# Patient Record
Sex: Male | Born: 1935 | Race: White | Hispanic: No | Smoking: Former smoker
Health system: Southern US, Community
[De-identification: ages and names within clinical notes are randomized; demographics above are authoritative.]

## PROBLEM LIST (undated history)

## (undated) DIAGNOSIS — G4730 Sleep apnea, unspecified: Secondary | ICD-10-CM

## (undated) DIAGNOSIS — I2510 Atherosclerotic heart disease of native coronary artery without angina pectoris: Secondary | ICD-10-CM

## (undated) DIAGNOSIS — N429 Disorder of prostate, unspecified: Secondary | ICD-10-CM

## (undated) DIAGNOSIS — Z87442 Personal history of urinary calculi: Secondary | ICD-10-CM

## (undated) DIAGNOSIS — M5430 Sciatica, unspecified side: Secondary | ICD-10-CM

## (undated) DIAGNOSIS — I480 Paroxysmal atrial fibrillation: Secondary | ICD-10-CM

## (undated) DIAGNOSIS — K635 Polyp of colon: Secondary | ICD-10-CM

## (undated) DIAGNOSIS — I10 Essential (primary) hypertension: Secondary | ICD-10-CM

## (undated) DIAGNOSIS — T8859XA Other complications of anesthesia, initial encounter: Secondary | ICD-10-CM

## (undated) DIAGNOSIS — K219 Gastro-esophageal reflux disease without esophagitis: Secondary | ICD-10-CM

## (undated) DIAGNOSIS — H3530 Unspecified macular degeneration: Secondary | ICD-10-CM

## (undated) DIAGNOSIS — I714 Abdominal aortic aneurysm, without rupture: Secondary | ICD-10-CM

## (undated) DIAGNOSIS — M810 Age-related osteoporosis without current pathological fracture: Secondary | ICD-10-CM

## (undated) DIAGNOSIS — C44111 Basal cell carcinoma of skin of unspecified eyelid, including canthus: Secondary | ICD-10-CM

## (undated) DIAGNOSIS — T4145XA Adverse effect of unspecified anesthetic, initial encounter: Secondary | ICD-10-CM

## (undated) DIAGNOSIS — R413 Other amnesia: Secondary | ICD-10-CM

## (undated) DIAGNOSIS — D122 Benign neoplasm of ascending colon: Secondary | ICD-10-CM

## (undated) DIAGNOSIS — C801 Malignant (primary) neoplasm, unspecified: Secondary | ICD-10-CM

## (undated) DIAGNOSIS — M1990 Unspecified osteoarthritis, unspecified site: Secondary | ICD-10-CM

## (undated) DIAGNOSIS — H9190 Unspecified hearing loss, unspecified ear: Secondary | ICD-10-CM

## (undated) DIAGNOSIS — C61 Malignant neoplasm of prostate: Secondary | ICD-10-CM

## (undated) DIAGNOSIS — Z8719 Personal history of other diseases of the digestive system: Secondary | ICD-10-CM

## (undated) DIAGNOSIS — J449 Chronic obstructive pulmonary disease, unspecified: Secondary | ICD-10-CM

## (undated) DIAGNOSIS — F0390 Unspecified dementia without behavioral disturbance: Secondary | ICD-10-CM

## (undated) DIAGNOSIS — G939 Disorder of brain, unspecified: Secondary | ICD-10-CM

## (undated) DIAGNOSIS — Z95810 Presence of automatic (implantable) cardiac defibrillator: Secondary | ICD-10-CM

## (undated) DIAGNOSIS — I499 Cardiac arrhythmia, unspecified: Secondary | ICD-10-CM

## (undated) DIAGNOSIS — R296 Repeated falls: Secondary | ICD-10-CM

## (undated) DIAGNOSIS — D124 Benign neoplasm of descending colon: Secondary | ICD-10-CM

## (undated) DIAGNOSIS — K649 Unspecified hemorrhoids: Secondary | ICD-10-CM

## (undated) DIAGNOSIS — R32 Unspecified urinary incontinence: Secondary | ICD-10-CM

## (undated) HISTORY — DX: Benign neoplasm of descending colon: D12.4

## (undated) HISTORY — PX: NM GATED MYOVIEW (ARMC HX): HXRAD1856

## (undated) HISTORY — DX: Atherosclerotic heart disease of native coronary artery without angina pectoris: I25.10

## (undated) HISTORY — PX: NM MYOVIEW (ARMC HX): HXRAD1857

## (undated) HISTORY — DX: Polyp of colon: K63.5

## (undated) HISTORY — DX: Benign neoplasm of ascending colon: D12.2

## (undated) HISTORY — DX: Sciatica, unspecified side: M54.30

## (undated) HISTORY — DX: Paroxysmal atrial fibrillation: I48.0

## (undated) HISTORY — PX: HIATAL HERNIA REPAIR: SHX195

## (undated) HISTORY — DX: Age-related osteoporosis without current pathological fracture: M81.0

## (undated) HISTORY — PX: FRACTURE SURGERY: SHX138

## (undated) HISTORY — DX: Basal cell carcinoma of skin of unspecified eyelid, including canthus: C44.111

## (undated) HISTORY — DX: Other amnesia: R41.3

## (undated) HISTORY — DX: Chronic obstructive pulmonary disease, unspecified: J44.9

## (undated) HISTORY — DX: Disorder of brain, unspecified: G93.9

## (undated) HISTORY — PX: ANKLE SURGERY: SHX546

## (undated) HISTORY — DX: Essential (primary) hypertension: I10

## (undated) HISTORY — PX: SHOULDER ARTHROSCOPY: SHX128

## (undated) HISTORY — DX: Gastro-esophageal reflux disease without esophagitis: K21.9

## (undated) HISTORY — DX: Unspecified hemorrhoids: K64.9

---
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
## 1996-09-01 DIAGNOSIS — I2510 Atherosclerotic heart disease of native coronary artery without angina pectoris: Secondary | ICD-10-CM

## 1996-09-01 HISTORY — PX: CORONARY ARTERY BYPASS GRAFT: SHX141

## 1996-09-01 HISTORY — DX: Atherosclerotic heart disease of native coronary artery without angina pectoris: I25.10

## 1997-09-01 DIAGNOSIS — I25810 Atherosclerosis of coronary artery bypass graft(s) without angina pectoris: Secondary | ICD-10-CM | POA: Insufficient documentation

## 1998-01-30 ENCOUNTER — Ambulatory Visit (HOSPITAL_COMMUNITY): Admission: RE | Admit: 1998-01-30 | Discharge: 1998-01-30 | Payer: Self-pay | Admitting: Interventional Cardiology

## 1998-01-30 HISTORY — PX: CARDIAC CATHETERIZATION: SHX172

## 1998-07-17 ENCOUNTER — Ambulatory Visit (HOSPITAL_COMMUNITY): Admission: RE | Admit: 1998-07-17 | Discharge: 1998-07-17 | Payer: Self-pay | Admitting: Gastroenterology

## 2002-10-25 ENCOUNTER — Encounter: Payer: Self-pay | Admitting: Family Medicine

## 2002-10-25 ENCOUNTER — Encounter: Admission: RE | Admit: 2002-10-25 | Discharge: 2002-10-25 | Payer: Self-pay | Admitting: Family Medicine

## 2002-12-02 ENCOUNTER — Ambulatory Visit (HOSPITAL_COMMUNITY): Admission: RE | Admit: 2002-12-02 | Discharge: 2002-12-02 | Payer: Self-pay | Admitting: Gastroenterology

## 2002-12-02 ENCOUNTER — Encounter (INDEPENDENT_AMBULATORY_CARE_PROVIDER_SITE_OTHER): Payer: Self-pay | Admitting: Specialist

## 2003-06-02 ENCOUNTER — Encounter (INDEPENDENT_AMBULATORY_CARE_PROVIDER_SITE_OTHER): Payer: Self-pay | Admitting: Specialist

## 2003-06-02 ENCOUNTER — Ambulatory Visit (HOSPITAL_COMMUNITY): Admission: RE | Admit: 2003-06-02 | Discharge: 2003-06-02 | Payer: Self-pay | Admitting: Gastroenterology

## 2003-06-26 ENCOUNTER — Encounter: Admission: RE | Admit: 2003-06-26 | Discharge: 2003-06-26 | Payer: Self-pay | Admitting: Family Medicine

## 2003-06-26 ENCOUNTER — Encounter: Payer: Self-pay | Admitting: Family Medicine

## 2003-09-09 ENCOUNTER — Encounter: Admission: RE | Admit: 2003-09-09 | Discharge: 2003-09-09 | Payer: Self-pay | Admitting: Orthopedic Surgery

## 2003-09-24 ENCOUNTER — Other Ambulatory Visit: Payer: Self-pay

## 2003-09-25 ENCOUNTER — Other Ambulatory Visit: Payer: Self-pay

## 2004-06-06 ENCOUNTER — Encounter: Admission: RE | Admit: 2004-06-06 | Discharge: 2004-06-06 | Payer: Self-pay | Admitting: Family Medicine

## 2005-09-01 DIAGNOSIS — I771 Stricture of artery: Secondary | ICD-10-CM

## 2005-09-01 HISTORY — DX: Stricture of artery: I77.1

## 2005-11-30 DIAGNOSIS — I7409 Other arterial embolism and thrombosis of abdominal aorta: Secondary | ICD-10-CM | POA: Insufficient documentation

## 2005-12-15 ENCOUNTER — Observation Stay (HOSPITAL_COMMUNITY): Admission: RE | Admit: 2005-12-15 | Discharge: 2005-12-16 | Payer: Self-pay | Admitting: Cardiology

## 2005-12-15 DIAGNOSIS — I739 Peripheral vascular disease, unspecified: Secondary | ICD-10-CM | POA: Insufficient documentation

## 2005-12-15 HISTORY — PX: ILIAC ARTERY STENT: SHX1786

## 2005-12-21 ENCOUNTER — Encounter: Payer: Self-pay | Admitting: Vascular Surgery

## 2005-12-21 ENCOUNTER — Emergency Department (HOSPITAL_COMMUNITY): Admission: EM | Admit: 2005-12-21 | Discharge: 2005-12-21 | Payer: Self-pay | Admitting: Emergency Medicine

## 2006-06-01 DIAGNOSIS — Q254 Congenital malformation of aorta unspecified: Secondary | ICD-10-CM | POA: Insufficient documentation

## 2006-06-01 DIAGNOSIS — I6529 Occlusion and stenosis of unspecified carotid artery: Secondary | ICD-10-CM | POA: Insufficient documentation

## 2006-06-01 HISTORY — PX: OTHER SURGICAL HISTORY: SHX169

## 2006-06-02 ENCOUNTER — Ambulatory Visit (HOSPITAL_COMMUNITY): Admission: RE | Admit: 2006-06-02 | Discharge: 2006-06-02 | Payer: Self-pay | Admitting: Vascular Surgery

## 2006-06-02 HISTORY — PX: OTHER SURGICAL HISTORY: SHX169

## 2006-06-29 ENCOUNTER — Inpatient Hospital Stay (HOSPITAL_COMMUNITY): Admission: RE | Admit: 2006-06-29 | Discharge: 2006-06-30 | Payer: Self-pay | Admitting: Vascular Surgery

## 2006-06-29 ENCOUNTER — Encounter (INDEPENDENT_AMBULATORY_CARE_PROVIDER_SITE_OTHER): Payer: Self-pay | Admitting: Specialist

## 2006-06-29 HISTORY — PX: CAROTID ENDARTERECTOMY: SUR193

## 2006-10-16 ENCOUNTER — Emergency Department: Payer: Medicare Other

## 2007-02-12 ENCOUNTER — Ambulatory Visit: Payer: Self-pay | Admitting: Vascular Surgery

## 2007-07-07 ENCOUNTER — Ambulatory Visit: Payer: Self-pay | Admitting: Vascular Surgery

## 2008-02-16 ENCOUNTER — Ambulatory Visit: Payer: Self-pay | Admitting: Vascular Surgery

## 2008-08-16 ENCOUNTER — Ambulatory Visit: Payer: Self-pay | Admitting: Vascular Surgery

## 2008-10-22 ENCOUNTER — Emergency Department: Payer: Self-pay | Admitting: Emergency Medicine

## 2008-11-14 ENCOUNTER — Ambulatory Visit (HOSPITAL_BASED_OUTPATIENT_CLINIC_OR_DEPARTMENT_OTHER): Admission: RE | Admit: 2008-11-14 | Discharge: 2008-11-15 | Payer: Self-pay | Admitting: Orthopedic Surgery

## 2009-03-07 ENCOUNTER — Ambulatory Visit: Payer: Self-pay | Admitting: Vascular Surgery

## 2009-05-28 DIAGNOSIS — M1990 Unspecified osteoarthritis, unspecified site: Secondary | ICD-10-CM | POA: Insufficient documentation

## 2009-05-28 DIAGNOSIS — I10 Essential (primary) hypertension: Secondary | ICD-10-CM | POA: Insufficient documentation

## 2009-05-28 DIAGNOSIS — E7800 Pure hypercholesterolemia, unspecified: Secondary | ICD-10-CM | POA: Insufficient documentation

## 2009-05-28 DIAGNOSIS — K219 Gastro-esophageal reflux disease without esophagitis: Secondary | ICD-10-CM | POA: Insufficient documentation

## 2009-05-28 DIAGNOSIS — F4320 Adjustment disorder, unspecified: Secondary | ICD-10-CM | POA: Insufficient documentation

## 2009-06-04 DIAGNOSIS — M25579 Pain in unspecified ankle and joints of unspecified foot: Secondary | ICD-10-CM | POA: Insufficient documentation

## 2009-07-18 ENCOUNTER — Ambulatory Visit (HOSPITAL_COMMUNITY): Admission: RE | Admit: 2009-07-18 | Discharge: 2009-07-18 | Payer: Self-pay | Admitting: Orthopedic Surgery

## 2009-08-17 DIAGNOSIS — R002 Palpitations: Secondary | ICD-10-CM | POA: Insufficient documentation

## 2009-08-17 DIAGNOSIS — J309 Allergic rhinitis, unspecified: Secondary | ICD-10-CM | POA: Insufficient documentation

## 2009-09-12 ENCOUNTER — Ambulatory Visit: Payer: Self-pay | Admitting: Vascular Surgery

## 2010-09-06 ENCOUNTER — Ambulatory Visit: Admission: RE | Admit: 2010-09-06 | Discharge: 2010-09-06 | Payer: Self-pay | Source: Home / Self Care

## 2010-09-06 ENCOUNTER — Ambulatory Visit: Admit: 2010-09-06 | Payer: Self-pay | Admitting: Vascular Surgery

## 2010-10-14 ENCOUNTER — Other Ambulatory Visit: Payer: Self-pay

## 2010-10-28 LAB — HM COLONOSCOPY

## 2010-11-20 HISTORY — PX: DOPPLER ECHOCARDIOGRAPHY: SHX263

## 2010-12-04 LAB — BASIC METABOLIC PANEL
BUN: 17 mg/dL (ref 6–23)
CO2: 25 mEq/L (ref 19–32)
Calcium: 9.1 mg/dL (ref 8.4–10.5)
Chloride: 106 mEq/L (ref 96–112)
Creatinine, Ser: 1.12 mg/dL (ref 0.4–1.5)
GFR calc Af Amer: >60 mL/min (ref >60)
GFR calc non Af Amer: >60 mL/min (ref >60)
Glucose, Bld: 106 mg/dL — ABNORMAL HIGH (ref 70–99)
Potassium: 4 mEq/L (ref 3.5–5.1)
Sodium: 140 mEq/L (ref 135–145)

## 2010-12-12 LAB — POCT I-STAT 4, (NA,K, GLUC, HGB,HCT)
Glucose, Bld: 95 mg/dL (ref 70–99)
HCT: 49 % (ref 39.0–52.0)
Hemoglobin: 16.7 g/dL (ref 13.0–17.0)
Potassium: 3.9 mEq/L (ref 3.5–5.1)
Sodium: 140 mEq/L (ref 135–145)

## 2011-01-14 NOTE — Op Note (Signed)
NAME:  [name], [name]             ACCOUNT NO.:  [id number]   MEDICAL RECORD NO.:  [id number]          PATIENT TYPE:  AMB   LOCATION:  [facility]                         FACILITY:  [facility]   PHYSICIAN:  Marlowe Kays, M.D.  DATE OF BIRTH:  05-Nov-1933   DATE OF PROCEDURE:  11/14/2008  DATE OF DISCHARGE:                               OPERATIVE REPORT   PREOPERATIVE DIAGNOSIS:  Traumatic arthritis left ankle.   POSTOPERATIVE DIAGNOSIS:  Traumatic arthritis left ankle.   OPERATION:  Arthrodesis left ankle using autologous cancellus bone, two  cannulated screws and C-arm.   SURGEON:  Dr. Simonne Come.   ASSISTANT:  Mr. Idolina Primer, [place].   ANESTHESIA:  General.   PATHOLOGY AND JUSTIFICATION FOR PROCEDURE:  This man sustained a severe  ankle fracture several years ago and has a posttraumatic deformity with  valgus deformity of the talus and the ankle joint and considerable pain.  Since he has limited motion already, he has elected at this time to come  and have the above-mentioned surgery.  The operation was discussed with  him in detail.   DESCRIPTION OF PROCEDURE:  Prophylactic antibiotics, satisfactory  general anesthesia, pneumatic tourniquet with the leg Esmarch 'd out non-  sterilely and prepped from mid calf to toes with DuraPrep and draped in  a sterile field.  Timeout performed.  I made an anterior incision  following the natural interval between the extensor tendons where the  neurovascular bundle retracted, working my way down to the anterior  capsule of the ankle joint.  There was a good bit of scar removed  cautiously.  Because of this, when I was adjacent to the bone of the  distal tibia, I opened this with a knife blade and using small  periosteal elevator began dissecting along the talus and the distal  tibia working medially and laterally and placing two Homan retractors.  He had some posttraumatic deformity of the anterior and superior talus,  which I removed with a small  rongeur.  After debriding a good bit of  reactive tissue from between the medial malleolus and medial talus and  laterally adjacent to the fibula, I then used a combination of  osteotomes, curettes and small rongeur to perform squared off cutting of  the articular surfaces of both the tibia and the talus, working our way  back posteriorly, with caution there using mainly curettes to protect  the posterior soft tissues.  When I felt I had removed all fibrous and  reactive tissue as well as residual articular cartilage, I went ahead  and packed the ankle joint with the previously hydrated cancellus  allograft.  I then using the C-arm took a preliminary x-ray with the  ankle in a corrected position both in terms of varus-valgus and also  with a neutral dorsiflexion.  All joint surfaces appeared to be in good  contact with each other.  Accordingly, I then outlined on the external  ankle using a large guidepin and the C-arm position for the fixation  cannulating screws.  After confirming the position, thus I then placed  the guidepins and went through the lateral fibula  and the other through  the medial tibial above the metaphyseal flare until I felt that they  were in good position on both AP and lateral C-arm x-rays.  On measuring  the length of the guidepin, I selected a 65 mm 6.5 screw from medially  and a 75 mm 6.5 screw far laterally.  Under direct visualization with  the C-arm, I screwed both these in until they were flush with the  lateral cortices of the medial malleolar area and the lateral fibula  respectively.  This brought the screws nicely into the talus on both AP  and lateral projections with good compression of the ankle joint in the  desired position discussed above.  I then removed the guidepins and at  this point we reached 2 hours of tourniquet time.  The tourniquet was  released with no major bleeders.  I irrigated the wound gently and then  closed it with interrupted 2-0  Vicryl in the retinaculum and capsular  tissue over the distal tibia, ankle joint and over the dorsum of the  talus.  The closure was loose to allow bleeding egress.  The  subcutaneous tissue was then closed loosely once again with 3-0 Vicryl  with staples on the skin, except for the two penetration sites medially  and laterally which I closed with 4-0 nylon.  Betadine Adaptic dry  sterile dressing and a well-padded short-leg splint cast was applied.  He tolerated the procedure well and was taken to the recovery room in  satisfactory condition with no known complications.           ______________________________  Marlowe Kays, M.D.     JA/MEDQ  D:  11/14/2008  T:  11/15/2008  Job:  [id number]

## 2011-01-14 NOTE — Procedures (Signed)
CAROTID DUPLEX EXAM   INDICATION:  Follow up carotid artery disease.   HISTORY:  Diabetes:  No.  Cardiac:  CAD, CABG.  Hypertension:  Yes.  Smoking:  Quit.  Previous Surgery:  Left CEA with DPA on 06/29/2006.  CV History:  Asymptomatic.  Amaurosis Fugax No, Paresthesias No, Hemiparesis No.                                       RIGHT             LEFT  Brachial systolic pressure:         110               115  Brachial Doppler waveforms:         WNL               WNL  Vertebral direction of flow:        Antegrade         Antegrade  DUPLEX VELOCITIES (cm/sec)  CCA peak systolic                   107               88  ECA peak systolic                   119               77  ICA peak systolic                   255               88  ICA end diastolic                   89                27  PLAQUE MORPHOLOGY:                  Heterogenous      N/A  PLAQUE AMOUNT:                      Moderate-to-severe                  N/A  PLAQUE LOCATION:                    ICA               N/A   IMPRESSION:  1. Right internal carotid artery shows evidence of 60-79% stenosis.  2. Left internal carotid artery shows no evidence of stenosis, status      post carotid endarterectomy.  3. No significant changes from previous study.   ___________________________________________  Janetta Hora Fields, MD   AS/MEDQ  D:  08/16/2008  T:  08/16/2008  Job:  [id number]

## 2011-01-14 NOTE — Procedures (Signed)
CAROTID DUPLEX EXAM   INDICATION:  Followup left carotid endarterectomy.   HISTORY:  Diabetes:  No.  Cardiac:  CAD, CABG.  Hypertension:  Yes.  Smoking:  Quit.  Previous Surgery:  Left carotid endarterectomy on 06/29/2006.  CV History:  No.  Amaurosis Fugax No, Paresthesias No, Hemiparesis No                                       RIGHT             LEFT  Brachial systolic pressure:         132               134  Brachial Doppler waveforms:         Normal            Normal  Vertebral direction of flow:        Antegrade         Antegrade  DUPLEX VELOCITIES (cm/sec)  CCA peak systolic                   76                77  ECA peak systolic                   88                70  ICA peak systolic                   260               80  ICA end diastolic                   80                26  PLAQUE MORPHOLOGY:                  Heterogenous      None  PLAQUE AMOUNT:                      Moderate to severe                  None  PLAQUE LOCATION:                    ICA               None   IMPRESSION:  1. Patent left carotid endarterectomy site with no evidence of      stenosis.  2. 60-79% stenosis of the right internal carotid artery.  3. No significant change noted from the previous exam on 07/07/2007.   ___________________________________________  Janetta Hora. Fields, MD   CH/MEDQ  D:  02/16/2008  T:  02/16/2008  Job:  [id number]

## 2011-01-14 NOTE — Procedures (Signed)
CAROTID DUPLEX EXAM   INDICATION:  Carotid disease.   HISTORY:  Diabetes:  No.  Cardiac:  CABG.  Hypertension:  Yes.  Smoking:  Previous.  Previous Surgery:  Left carotid endarterectomy on 06/29/2006.  CV History:  Currently asymptomatic.  Amaurosis Fugax No, Paresthesias No, Hemiparesis No                                       RIGHT             LEFT  Brachial systolic pressure:         166               168  Brachial Doppler waveforms:         Normal            Normal  Vertebral direction of flow:        Antegrade         Antegrade  DUPLEX VELOCITIES (cm/sec)  CCA peak systolic                   98                73  ECA peak systolic                   86                57  ICA peak systolic                   181               60  ICA end diastolic                   47                12  PLAQUE MORPHOLOGY:                  Heterogeneous  PLAQUE AMOUNT:                      Moderate          None  PLAQUE LOCATION:                    ICA   IMPRESSION:  1. Doppler velocities suggest a 40% to 58% stenosis of the right      proximal internal carotid artery.  2. Patent left carotid endarterectomy site with no left internal      carotid artery stenosis.  3. Doppler velocities of the right internal carotid artery appear less      than previously recorded when compared to a previous exam on      09/12/2009 with the left internal carotid artery remaining stable.   ___________________________________________  Larina Earthly, M.D.   CH/MEDQ  D:  09/09/2010  T:  09/09/2010  Job:  [id number]

## 2011-01-14 NOTE — Procedures (Signed)
CAROTID DUPLEX EXAM   INDICATION:  Followup evaluation of known carotid artery disease.   HISTORY:  Diabetes:  No.  Cardiac:  Coronary artery bypass graft.  Hypertension:  Yes.  Smoking:  Former smoker.  Previous Surgery:  Left carotid endarterectomy with Dacron patch  angioplasty on 06/29/2006.  CV History:  Previous duplex on 08/16/2008 revealed 60-70% right ICA  stenosis and no left ICA stenosis status post endarterectomy.  Amaurosis Fugax No, Paresthesias No, Hemiparesis No                                       RIGHT             LEFT  Brachial systolic pressure:         160               160  Brachial Doppler waveforms:         Triphasic         Triphasic  Vertebral direction of flow:        Antegrade         Antegrade  DUPLEX VELOCITIES (cm/sec)  CCA peak systolic                   70                62  ECA peak systolic                   89                53  ICA peak systolic                   237               64  ICA end diastolic                   66                21  PLAQUE MORPHOLOGY:                  Mixed             None  PLAQUE AMOUNT:                      Moderate          None  PLAQUE LOCATION:                    Proximal ICA      None   IMPRESSION:  1. 60-79% right ICA stenosis.  2. No left ICA stenosis status post endarterectomy.  3. No significant change from previous study performed 08/16/2008.   ___________________________________________  Larina Earthly, M.D.   MC/MEDQ  D:  03/07/2009  T:  03/07/2009  Job:  [id number]

## 2011-01-14 NOTE — Procedures (Signed)
CAROTID DUPLEX EXAM   INDICATION:  Carotid disease.   HISTORY:  Diabetes:  No.  Cardiac:  CABG.  Hypertension:  Yes.  Smoking:  Previous.  Previous Surgery:  Left carotid endarterectomy on 06/29/2006.  CV History:  Currently asymptomatic.  Amaurosis Fugax No, Paresthesias No, Hemiparesis No                                       RIGHT             LEFT  Brachial systolic pressure:         120               122  Brachial Doppler waveforms:         Normal            Normal  Vertebral direction of flow:        Antegrade         Antegrade  DUPLEX VELOCITIES (cm/sec)  CCA peak systolic                   99                94  ECA peak systolic                   99                71  ICA peak systolic                   231               80  ICA end diastolic                   67                28  PLAQUE MORPHOLOGY:                  Heterogeneous  PLAQUE AMOUNT:                      Moderate          None  PLAQUE LOCATION:                    ICA / ECA   IMPRESSION:  1. 60%-70% stenosis of the right internal carotid artery.  2. Patent left carotid endarterectomy site with no evidence of left      internal carotid artery stenosis.  3. No significant change noted when compared to the previous exam on      03/07/2009.   ___________________________________________  Larina Earthly, M.D.   CH/MEDQ  D:  09/13/2009  T:  09/13/2009  Job:  [id number]

## 2011-01-14 NOTE — Procedures (Signed)
CAROTID DUPLEX EXAM   INDICATION:  Carotid artery disease with dizziness and feeling  unbalanced.   HISTORY:  Diabetes:  No.  Cardiac:  CAD, CABG in August, 2007.  Hypertension:  Yes.  Smoking:  Quit.  Previous Surgery:  Left CEA with DPA on 06/29/2006 by [name].  CV History:  No  Amaurosis Fugax No. Paresthesias No.  Hemiparesis No.                                       RIGHT             LEFT  Brachial systolic pressure:         134               135  Brachial Doppler waveforms:         Triphasic.        Triphasic.  Vertebral direction of flow:        Antegrade.        Antegrade.  DUPLEX VELOCITIES (cm/sec)  CCA peak systolic                   87                79  ECA peak systolic                   117               78  ICA peak systolic                   272               86  ICA end diastolic                   77                29  PLAQUE MORPHOLOGY:                  Mixed             None  PLAQUE AMOUNT:                      Moderate/severe.  None.  PLAQUE LOCATION:                    ICA               None.   IMPRESSION:  Right ICA stenosis of 60-79%.  Left ICA shows no evidence of restenosis, status post CEA.  No significant changes from previous study.   ___________________________________________  Janetta Hora Fields, MD   AS/MEDQ  D:  07/07/2007  T:  07/08/2007  Job:  [id number]

## 2011-01-14 NOTE — H&P (Signed)
HISTORY AND PHYSICAL EXAMINATION   September 06, 2010   Re:  [name]             DOB:  07-23-1934   HISTORY OF PRESENT ILLNESS:  The patient is a Caucasian male who  presents today for continued evaluation of carotid duplex status post  left carotid endarterectomy in 2007.  The patient feels well and is  without complaint.  He has been followed routinely with serial duplexes.  The patient denies all symptoms of TIA and CVA, including amaurosis  fugax, aphasia, facial droop, and hemiparesis.   PAST MEDICAL/SURGICAL HISTORY:  1. Hypertension.  2. Hyperlipidemia.  3. Carotid stenosis, status post left carotid endarterectomy.  4. Iliac disease, status post bilateral iliac stents in 2007.  5. Coronary artery disease, status post CABG 12 years ago.  6. Ankle surgery.  7. Tonsillectomy.  8. BPH.   ALLERGIES:  No known drug allergies.   FAMILY HISTORY:  Peptic ulcers, dementia, and alcohol abuse.   PERSONAL HISTORY:  The patient is widowed.  He has 1 child.  He is  retired.  He does not smoke but quit 12 years ago.  He does not drink  alcohol on a regular basis.   REVIEW OF SYSTEMS:  A complete review of systems is negative except as  delineated in the HPI.   IMAGING:  Carotid duplex completed on 08/07/2011 reveals a 40% to 59%  stenosis of the right internal carotid artery, which is decreased from  the value on 09/12/2009.  There is a patent left carotid endarterectomy  site with no left internal carotid artery stenosis.   PHYSICAL EXAMINATION:  Blood pressure 167/92, O2 saturation 95% on room  air, heart rate 60.  General:  This is a well-nourished male in no acute  distress.  HEENT: PERLA.  EOMI.  Conjunctivae normal.  There are no  carotid bruits noted.  Lungs:  Clear to auscultation.  Cardiovascular:  Regular rate and rhythm.  Abdomen:  Soft, nontender, nondistended with  normoactive bowel sounds.  Musculoskeletal:  No major deformities or  cyanosis.  There are 2+ pulses noted in the carotids, radials, femorals,  and dorsalis pedis bilaterally.  Neuro:  No focal weakness or  paresthesias.  Skin:  No ulcers or rashes.   ASSESSMENT:  Carotid artery stenosis.   PLAN:  The patient continues to do well and will need to be followed  serially with carotid duplex.  He will follow up in 1 year for repeat  duplex and call with any questions, issues, or problems in the interim.   Pecola Leisure, [place]   Fransisco Hertz, MD  Electronically Signed   AY/MEDQ  D:  09/06/2010  T:  09/06/2010  Job:  [id number]

## 2011-01-17 NOTE — Consult Note (Signed)
NAME:  [name], TALLARICO NO.:  [id number]   MEDICAL RECORD NO.:  [id number]          PATIENT TYPE:  INP   LOCATION:  NA                           FACILITY:  [facility]   PHYSICIAN:  Marin Roberts, MDDATE OF BIRTH:  Feb 21, 1934   DATE OF CONSULTATION:  DATE OF DISCHARGE:                                   CONSULTATION   Dear [name]:   Thank you for sending the intracranial images from Mr. Fenter recent  cerebral angiogram.   Right common carotid artery injection:  The right anterior cerebral artery is not seen.  This is likely aplastic.  The posterior cerebral artery is a fetal type with persistent filling from  this injection.  There is no significant stenosis, aneurysm or branch vessel  occlusion.  There is normal filling of the dural sinuses.   Left common carotid artery injection:  The anterior communicating artery is patent.  Both A1 segments fill from the  left-sided injection.  There is mild irregularity of the supraclinoid  internal carotid artery, compatible with atherosclerotic disease.  Note is  made of a downward pointing outpouching of the left A1 segment in the level  of the anterior communicating artery.  This may represent a loop, although a  focal aneurysm is not excluded.  There are no other focal stenoses or branch  vessel occlusions.  Subdural sinus is normally.   IMPRESSION:  1. A 4 to 5 mm downward outpouching of the anterior cerebral artery at the      level of the anterior communicating artery.  Most may represent a loop      of the distal left A1 segment, aneurysm is not excluded.  Dedicated      cerebral angiography is recommended for further evaluation.  2. Mild irregularity of the supraclinoid left internal carotid artery.  3. Aplastic right A1 segment.   Thanks again for sending these images.  If I can be of any further  assistance, please do not hesitate to call.           ______________________________  Marin Roberts, MD     CM/MEDQ  D:  06/26/2006  T:  06/27/2006  Job:  [id number]

## 2011-01-17 NOTE — Op Note (Signed)
NAME:  [name], [name]             ACCOUNT NO.:  [id number]   MEDICAL RECORD NO.:  [id number]          PATIENT TYPE:  AMB   LOCATION:  [facility]                          FACILITY:  [facility]   PHYSICIAN:  Janetta Hora. Fields, MD  DATE OF BIRTH:  April 21, 1934   DATE OF PROCEDURE:  06/02/2006  DATE OF DISCHARGE:  06/02/2006                                 OPERATIVE REPORT   PROCEDURE:  1. Arch aortogram.  2. Selective right and left common carotid angiogram.   PREOPERATIVE DIAGNOSIS:  Left internal carotid artery stenosis.   POSTOPERATIVE DIAGNOSIS:  Left internal carotid artery stenosis.   ANESTHESIA:  Local.   ASSISTANT:  Nurse.   INDICATIONS:  The patient recently had a duplex ultrasound which suggested a  high-grade, rapid progression of stenosis on the left internal carotid  artery.  The duplex ultrasound also suggested that the plaque extended  fairly high into the neck and may not be accessible from a cervical  approach.  The patient presents for carotid angiogram to further determine  the anatomy on the left side.   OPERATIVE DETAILS:  After obtaining informed consent, the patient was taken  to the operating room.  The patient was placed in supine position on the  angio table.  Next both groins were prepped and draped in the usual sterile  fashion.  Local anesthesia was infiltrated over the right common femoral  artery.  A Majestic needle was used to cannulate the right common femoral  artery without difficulty.  A 0.035 Wholey wire was then advanced into the  abdominal aorta under fluoroscopic guidance.  A 5-French sheath was then  placed over the guidewire in the right common femoral artery.  Next a 5-  French pigtail catheter was placed over the guidewire, and these were  advanced as a unit into the aortic arch.  The guidewire was then removed,  and arch aortogram performed using the pigtail catheter.  This shows the  innominate origin to be widely patent.  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  The origin of the right  common carotid and right subclavian are widely patent.  The right vertebral  artery has approximately 50% narrowing at its origin, but comes off normal  position of the subclavian.  Right internal mammary artery is patent at its  proximal portion.  The common carotid on the left side is patent throughout  its course.  The left subclavian artery has approximately a 25% narrowing at  its origin.  The left vertebral artery is large and dominant, and has some  mild narrowing at its origin.  The proximal portion of the left internal  mammary artery is patent.  The proximal portion of the left subclavian  artery is patent.   Next the pigtail catheter was pulled back over a guidewire and exchanged for  an H1 catheter.  The H1 catheter was then used to selectively catheterize  the innominate artery followed by the right common carotid artery.  A  selective right common carotid artery injection was then performed.  This  shows a  stenosis of the right internal carotid artery.  A-P and lateral  projections were performed.  Also A-P and lateral projections of the  intracranial vasculature were performed, and these will be interpreted by a  neuro-radiologist at a later time as far as the cervical carotid artery is  concerned.  There is an approximately 50% stenosis of the right internal  carotid artery just above the carotid bifurcation.  The external carotid  artery is widely patent.  The common carotid artery termination site is  widely patent.  Next the H1 catheter was pulled back over the guidewire.  An  attempt was made to engage the left common carotid artery briefly without  success.  Next several catheters were used to engage the left common carotid  artery due to its takeoff from the innominate artery.  In succession, first  a Simmons-1 catheter, followed by a JR-4 catheter and an IMA catheter were   used.  Several attempts were made to cannulate the artery, but these were  unsuccessful due to its takeoff and origin.  During the course of  manipulation, all these catheters were kept thoroughly flushed and in  addition before attempting to engage the left common carotid artery, the  patient was also given 3000 units of intravenous heparin.  Finally using a  Vitek catheter over the guidewire, I was able to engage the left common  carotid artery and a left common carotid artery selective injection was  obtained.  An A-P and lateral projection were obtained of the intracranial  vasculature and these will be interpreted by the neuro-radiologist at a  later time.  The cervical right carotid artery was imaged in an A-P lateral  and oblique projection.  There is a tandem stenosis at the left carotid  bifurcation with a very jagged, ulcerated-looking plaque at the carotid  bifurcation which is approximately 50% at the proximal lesion and 70% at the  more distal lesion.  The distal extent of the plaque extends up to the base  of the C2 vertebral body.  The carotid becomes more normal up at the level  of the angle of the mandible.  The level of the plaque is moderately high.  The external carotid artery is widely patent.   Next the Vitek catheter was pulled back into the aortic arch over a  guidewire.  The Vitek catheter and guidewire then both removed.  The right  femoral sheath was left in place to be removed after the ACT was at a  reasonable value.  The patient was taken to the holding area in stable  condition.  The patient was neurologically intact on transfer back to the  holding area.  The patient tolerated the procedure well and there were no  complications.   OPERATIVE FINDINGS:  1. High-grade stenosis of left internal carotid artery.  2. A 50% stenosis right internal carotid artery. 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   4. Mild vertebral artery stenosis bilaterally.      Janetta Hora. Fields, MD  Electronically Signed     CEF/MEDQ  D:  06/02/2006  T:  06/03/2006  Job:  [id number]

## 2011-01-17 NOTE — Op Note (Signed)
NAME:  [name], [name]             ACCOUNT NO.:  [id number]   MEDICAL RECORD NO.:  [id number]          PATIENT TYPE:  INP   LOCATION:  3308                         FACILITY:  [facility]   PHYSICIAN:  Janetta Hora. Fields, MD  DATE OF BIRTH:  August 25, 1934   DATE OF PROCEDURE:  06/29/2006  DATE OF DISCHARGE:  06/30/2006                                 OPERATIVE REPORT   PROCEDURE:  Left carotid endarterectomy.   PREOPERATIVE DIAGNOSIS:  Left internal carotid artery stenosis.   POSTOPERATIVE DIAGNOSIS:  Left internal carotid artery stenosis.   ANESTHESIA:  General.   SURGEON:  Charles E. Fields, M.D.   ASSISTANT:  Cari Caraway, MD, Constance Holster, P.A.-C.   INDICATIONS:  The patient is a 77 year old male with a history of  asymptomatic high grade greater than 80% left internal carotid artery  stenosis.   OPERATIVE FINDINGS:  1. 10-French shunt.  2. Dacron patch.   OPERATIVE DETAIL:  After obtaining informed consent, the patient was taken  to the operating.  The patient was placed in a supine position on the  operating table.  After induction of general anesthesia and endotracheal  intubation, the patient's entire left neck and chest were prepped and draped  in the usual sterile fashion.  A Foley catheter was placed.  Next, an  oblique incision was made along the anterior border of the  sternocleidomastoid muscle on the left side of the neck.  The incision was  carried down through the subcutaneous tissues and the platysma was incised.  The sternocleidomastoid muscle was reflected laterally as well as the left  internal jugular vein.  The common facial vein was dissected free  circumferentially and ligated and divided between silk ties.  Dissection was  carried down to the level of the common carotid artery.  This was dissected  free circumferentially and controlled with an umbilical tape.  The vagus  nerve was identified and protected from harm's way.  The ansa cervicalis was  identified and this was followed up to level of the hypoglossal nerve.  The  hypoglossal nerve was identified and protected from harm's way.  Next, the  external carotid and superior thyroid arteries were dissected free  circumferentially and controlled with vessel loops.  Dissection was then  carried up on the internal carotid artery to the level of the plaque.  Preoperative angiography had shown the plaque was quite extensive and up  high.  Therefore, dissection was carried up to the level past the plaque.  This required extensive mobilization of the hypoglossal nerve and division  of the ansa cervicalis muscle.  It also required division of the occipital  artery in order to fully mobilize the hypoglossal nerve.  The plaque  extended up to but not pass the hypoglossal nerve.   Next, the patient was given 7000 units of intravenous heparin.  The distal  internal carotid artery was controlled with a fine bulldog clamp.  Vessel  loops were pulled taut on the external and superior thyroid arteries, and  the common carotid was controlled with a peripheral DeBakey clamp.  A  longitudinal arteriotomy was made  in the common carotid artery just below  the bifurcation.  The arteriotomy was extended through the level of stenosis  which was greater than 80%.  There was also a tandem lesion up the more  distal internal carotid artery which was approximately 70%.  The arteriotomy  was extended past the level of disease.  Next, a 10-French shunt was brought  up on the operative field and this was threaded into the distal internal  carotid artery and allowed to back bleed.  This was then threaded down on  the common carotid artery and inspected for air bubbles.  After the shunt  was inspected and found to be free of the air, flow was restored to the  brain after approximately five minutes of ischemia time.  Next, an  endarterectomy was begun in a suitable plane near the carotid bifurcation.  Eversion  endarterectomy was performed in the external carotid artery.  A  feathered endpoint was obtained in the distal internal carotid artery where  there was a slight step-off so this was tacked posteriorly with several 7-0  Prolene sutures.  The plaque was removed and sent to pathology as a  specimen.  Next, all loose debris was removed from the carotid.  The artery  was thoroughly irrigated with heparinized saline solution.  A Dacron patch  was then brought up on the operative field and sewn as a patch angioplasty  using running 6-0 Prolene suture.  Just prior to completion of the  anastomosis, the shunt was clamped.  The shunt was then first removed from  the distal internal carotid artery and controlled with a vessel loop.  The  external carotid artery was back bled thoroughly.  The common carotid artery  then had the shunt removed from it and it allowed to forward bleed.  The  anastomosis was completed.  Everything was forward bled, back bled, and  thoroughly flushed once more.  Flow was first restored to the external  carotid artery, and then after approximately five cardiac cycles, to the  internal carotid artery.  There was some bleeding at the distal toe of the  patch.  This was repaired with several 7-0 Prolene sutures.  After  hemostasis had been obtained, the Doppler was used to inspected the artery.  There was good flow through the internal, external and common carotid  arteries.  Hemostasis was obtained with thrombin and Gelfoam.  The patient  was also given 50 mg of protamine.  Next, the platysma was reapproximated  using a running 3-0 Vicryl suture.  The skin was closed with 4-0 Vicryl  subcuticular stitch.  The patient tolerated the procedure well and there  were no complications.  The sponge and needle counts were correct at the end  of the case.  The patient was awakened in the operating room and found to have symmetric upper extremity and lower extremity motor strength at the  end  of the case.  The tongue was midline.  The patient was taken to the recovery  room in stable condition.      Janetta Hora. Fields, MD  Electronically Signed     CEF/MEDQ  D:  06/30/2006  T:  06/30/2006  Job:  [id number]

## 2011-01-17 NOTE — Op Note (Signed)
NAME:  [name], [name]                       ACCOUNT NO.:  [id number]   MEDICAL RECORD NO.:  [id number]                   PATIENT TYPE:  AMB   LOCATION:  ENDO                                 FACILITY:  [facility]   PHYSICIAN:  Anselmo Rod, M.D.               DATE OF BIRTH:  [birth year]   DATE OF PROCEDURE:  12/05/2002  DATE OF DISCHARGE:  12/02/2002                                 OPERATIVE REPORT   PROCEDURE:  Esophagogastroduodenoscopy.   ENDOSCOPIST:  Anselmo Rod, M.D.   INSTRUMENT USED:  Olympus video panendoscope.   INDICATION FOR PROCEDURE:  Dysphagia in a 77 year old white male.  Rule out  esophageal stricture, esophagitis, etc.   PREPROCEDURE PREPARATION:  Informed consent was procured from the patient.  The patient was fasted for eight hours prior to the procedure.   PREPROCEDURE PHYSICAL:  VITAL SIGNS:  The patient had stable vital signs.  NECK:  Supple.  CHEST:  Clear to auscultation.  S1, S2 regular.  ABDOMEN:  Soft with normal bowel sounds.   DESCRIPTION OF PROCEDURE:  The patient was placed in the left lateral  decubitus position and sedated with 60 mg of Demerol and 6 mg of Versed  intravenously.  Once the patient was adequately sedate and maintained on low-  flow oxygen and continuous cardiac monitoring, the Olympus video  panendoscope was advanced through the mouthpiece, over the tongue, into the  esophagus under direct vision.  The entire esophagus appeared normal with no  evidence of ring, stricture, masses, esophagitis, or Barrett's mucosa.  The  esophagus seems widely patent.  The GE junction was healthy as well.  The  scope was then advanced into the stomach.  A small hiatal hernia was seen on  high retroflexion.  Prominent gastric folds were appreciated on retroflexion  in the high cardia.  These were biopsied for pathology.  The rest of the  gastric mucosa, including the antrum and midbody, appeared normal.  The  duodenal bulb and the proximal  small bowel up to 60 cm appeared normal as  well.   IMPRESSION:  1. Widely patent esophagus, no evidence of strictures or esophagitis.  2. Small hiatal hernia.  3. Prominent gastric folds seen on retroflexion, biopsies done.    RECOMMENDATIONS:  1. Await pathology results.  2. Outpatient follow-up in the next seven to 10 days for further     recommendations.  3. Esophageal manometry planned if the patient's symptoms persist.                                               Anselmo Rod, M.D.    JNM/MEDQ  D:  12/05/2002  T:  12/06/2002  Job:  [id number]   cc:   Talmadge Coventry, M.D.  [address], [phone]  [place]  [place] [address]  Fax: [phone]

## 2011-01-17 NOTE — Discharge Summary (Signed)
NAME:  [name], [name]             ACCOUNT NO.:  [id number]   MEDICAL RECORD NO.:  [id number]          PATIENT TYPE:  INP   LOCATION:  3308                         FACILITY:  [facility]   PHYSICIAN:  Janetta Hora. Fields, MD  DATE OF BIRTH:  03-11-1934   DATE OF ADMISSION:  06/29/2006  DATE OF DISCHARGE:  06/30/2006                               DISCHARGE SUMMARY   ADMISSION DIAGNOSIS:  Left internal carotid artery stenosis,  asymptomatic.   DISCHARGE DIAGNOSES:  1. Left internal carotid artery stenosis, status post left carotid      endarterectomy.  2. Extracranial cerebrovascular occlusive disease.  3. Coronary artery disease.  4. Gastroesophageal reflux disease.   CONSULTS:  Dr. Marin Roberts was consulted on June 26, 2006.   PROCEDURES:  On June 29, 2006, the patient underwent a left carotid  endarterectomy with Dacron patch angioplasty by Dr. Fabienne Bruns.   HISTORY AND PHYSICAL:  This is a 77 year old Caucasian male with history  of bilateral moderate internal carotid artery stenosis and extracranial  cerebrovascular occlusive disease.  The patient has been followed by Dr.  Jacinto Halim.  In April, he had an iliac artery stent placed.  The patient has  been followed up with [name] in the office.  Recently the patient's  carotid duplex showed a significant change from February of 2007.  The  patient has almost doubled his velocity.  The patient still remains  asymptomatic.  Carotid duplex showed greater than 80% left internal  carotid artery stenosis and 68% right internal carotid artery stenosis.  Inflow velocity in the left carotid showed 100 cm a second.  The patient  was admitted for elective carotid endarterectomy on June 29, 2006.   HOSPITAL COURSE:  Postoperatively the patient has progressed as  expected.  There were no complications with his surgery.  On the evening  of June 29, 2006, the patient was alert and oriented x3.  His neural,  motor, and  sensory upper and lower extremities were intact.  The  patient's tongue was midline.  The patient's vitals were stable.   Postop day #1, the patient still remained afebrile and his blood  pressure was stable.  Blood pressure is 115/48.  The patient remained  hemodynamically stable.  His kidney function was within normal limits.  The patient's neuro was intact.  Tongue was midline and he was  swallowing okay.  The patient's incision was clear, dry, and intact, and  he had no hematoma.  The patient was discharged home after he tolerated  his breakfast, ambulated, and voided without any problems.   DISCHARGE DISPOSITION:  The patient was discharged to home without any  home health.   MEDICATIONS:  1. Toprol-XL 50 mg p.o. daily.  2. Zestoretic 10/12.5 mg p.o. daily.  3. Plavix 75 mg p.o. daily.  4. Coenzyme Q10 fifty mg p.o. daily.  5. Aspirin 325 mg p.o. daily.  6. Mevacor 1 gram p.o. q.i.d.  7. Vytorin 10/40 mg p.o. daily.  8. Nexium 40 mg p.o. daily.  9. Oxycodone 5 mg 1-2 tabs every 4 hours p.r.n.   INSTRUCTIONS:  The patient instructed  to follow a low-fat, low-salt  diet.  No driving or heavy lifting greater than 10 pounds for 2 weeks.  The patient is to ambulate 3-5 times daily and increase activity as  tolerated.  He can shower and clean his incisions with mild soap and  water.  He is to continue his breathing exercises.  The patient is to  call the office if any wound problems shall arise, such as incision  erythema, drainage, temperature greater than 101.5.   FOLLOWUP:  The patient has followup appointment with [name] on  July 17, 2006, at 2:30 p.m.      Constance Holster, PA      Janetta Hora. Fields, MD  Electronically Signed    JMW/MEDQ  D:  07/31/2006  T:  08/01/2006  Job:  [id number]   cc:   Cristy Hilts. Jacinto Halim, MD  Talmadge Coventry, M.D.

## 2011-01-17 NOTE — H&P (Signed)
NAME:  [name], [name]             ACCOUNT NO.:  [id number]   MEDICAL RECORD NO.:  [id number]          PATIENT TYPE:  INP   LOCATION:  NA                           FACILITY:  [facility]   PHYSICIAN:  Janetta Hora. Fields, MD  DATE OF BIRTH:  09/13/1933   DATE OF ADMISSION:  06/29/2006  DATE OF DISCHARGE:                                HISTORY & PHYSICAL   PRIMARY CARE PHYSICIAN:  Dr. Smith Mince.   CARDIOLOGIST:  Dr. Jacinto Halim   CHIEF COMPLAINT:  Left internal carotid artery stenosis, asymptomatic.   HISTORY OF PRESENT ILLNESS:  This is a 77 year old Caucasian male with a  history of bilateral moderate internal carotid artery stenosis, extracranial  cerebrovascular occlusive disease, coronary artery disease.  About two years  ago, the patient was sent in consultation to [name] due to moderate  carotid stenosis found on Doppler by Dr. Smith Mince.  Due to the patient  being asymptomatic at the time, it was best thought he had six-month  followup for the carotid duplex scan.  Recently the patient's carotid duplex  in August of 2007 showed a significant change from February 2007.  The  patient almost had double the velocity.  The patient currently remains  asymptomatic.  He denies any headaches, nausea and vomiting, vertigo,  dizziness, seizures, numbness, muscle weakness, dysphagia, visual changes,  syncope, pre-syncope, memory loss, confusion, TIA or CVA symptoms.   Carotid duplex exam in September showed a high-grade, greater than 80%, left  internal carotid artery stenosis and a 68% right internal carotid artery  stenosis.  End diastolic velocity in the left side was 100 centimeters per  second.  Additionally on duplex, they were unable to see the distal end  point for the plaque, suggesting that it may go fairly high into the neck.  The patient was scheduled for a carotid arteriogram on June 02, 2006.  The  results are unavailable at this time.  It was discussed with the patient  that  since his stenosis is greater than 80%, we should consider a carotid  endarterectomy for stroke prophylaxis.  The risks and benefits were  explained to the patient and he has agreed to proceed.   PAST MEDICAL HISTORY:  1. Extracranial cerebrovascular disease.  2. Coronary artery disease.  3. GERD.   PAST SURGICAL HISTORY:  1. Bilateral iliac artery stents, December 15, 2005.  2. Coronary artery bypass grafting.  3. Appendectomy.  4. T&A.   ALLERGIES:  NO KNOWN DRUG ALLERGIES.   MEDICATIONS:  1. Toprol XL 50 mg p.o. daily.  2. __________  10/12.5 mg p.o. daily.  3. Nexium 40 mg p.o. daily.  4. Vytorin 10/40 mg p.o. daily.  5. Omacor 1 gram q.i.d.  6. Aspirin 325 mg p.o. daily.  7. Co-Q-10 50 mg p.o. daily.  8. Plavix 35 mg p.o. daily, but the patient did stop this two days ago.   REVIEW OF SYSTEMS:  See HPI for pertinent positives and negatives.  Otherwise negative for diabetes mellitus type 2, cerebrovascular accident.   SOCIAL HISTORY:  The patient is married and lives with his wife.  The  patient used to smoke and he did quit seven years ago.  The patient denies  alcohol use.  He is retired and does still drive.   FAMILY HISTORY:  The patient has mother deceased at 5 with a history of  arthritis.  The patient denies any family history of cerebrovascular  accident, diabetes mellitus, myocardial infarction/coronary artery disease.   PHYSICAL EXAMINATION:  Vitals:  Blood pressure 128/70.  Heart rate of 82.  Respirations 16.  General:  This is a 77 year old Caucasian male in no acute  distress.  Head, eyes, ears, nose, throat:  Normocephalic, atraumatic.  Pupils are equal, round and reactive to light and accommodation.  Extraocular movements are intact.  Oral mucosa pink and moist.  Sclerae  anicteric.  Neck:  Supple, palpable carotids.  No carotid bruits heard on  auscultation.  Respiratory:  Symmetrical on inspiration.  Unlabored and  clear to auscultation bilaterally.   Cardiac:  Regular rate and rhythm, no  murmurs, gallops or rubs.  Abdomen:  Soft, nontender, nondistended,  normoactive bowel sounds times four.  GU/Rectal:  Deferred.  Extremities:  No edema.  Lower extremities warm with 2+ radial, femoral, popliteal,  dorsalis pedis and posterior tibialis pulses bilaterally.  Neurologic:  Nonfocal.  Alert and oriented times four.  Gait steady.  Strength 5+  bilaterally throughout.  Deep tendon reflexes 2+ and symmetrical.   ASSESSMENT:  Left internal carotid artery stenosis, asymptomatic.   PLAN:  1. We will admit the patient to [facility] on June 29, 2006,      under [name].  2. The patient will undergo a left carotid endarterectomy.  3. Risks and benefits were explained to the patient in great detail.  Dr.      Darrick Penna will see and evaluate the patient prior to admission and agrees      with the above.  The patient was instructed to go to [facility] if he shall experience any significant dizziness, syncope, TIA      symptoms.      Constance Holster, PA      Janetta Hora. Fields, MD  Electronically Signed   JMW/MEDQ  D:  06/25/2006  T:  06/26/2006  Job:  [id number]   cc:   Janetta Hora. Darrick Penna, MD  Talmadge Coventry, M.D.  Cristy Hilts. Jacinto Halim, MD

## 2011-01-17 NOTE — Discharge Summary (Signed)
NAME:  [name], [name] NO.:  [id number]   MEDICAL RECORD NO.:  [id number]          PATIENT TYPE:  OBV   LOCATION:  6533                         FACILITY:  [facility]   PHYSICIAN:  Cristy Hilts. Jacinto Halim, MD       DATE OF BIRTH:  1934/04/07   DATE OF ADMISSION:  12/15/2005  DATE OF DISCHARGE:  12/16/2005                                 DISCHARGE SUMMARY   .   DISCHARGE DIAGNOSES:  1.  Claudication and peripheral vascular disease status post bilateral      common iliac artery and right external iliac artery percutaneous      transluminal angioplasty and stenting this admission.  2.  Coronary disease, coronary artery bypass grafting in 1997.  3.  Treated hypertension.  4.  Treated hyperlipidemia  5.  Benign prostatic hypertrophy.   HOSPITAL COURSE:  The patient is a 77 year old male with history of coronary  artery bypass grafting in 1997.  He has a history of hypertension and  dyslipidemia.  He has been having bilateral hip and calf claudication for  six months.  Dopplers as an outpatient were abnormal, and he was referred  Dr. Jacinto Halim for further evaluation.  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  He is admitted for peripheral angiogram which was done December 15, 2005.  This revealed 90% right common iliac artery and 60 to 70% left  common iliac artery.  He underwent bilateral common iliac artery angioplasty  and stenting with an addition of right external iliac artery angioplasty and  stenting.  He tolerated the procedure well.  We feel he can be discharged  December 16, 2005.   DISCHARGE MEDICATIONS:  1.  Toprol XL 50 mg once a day.  2.  Zestoretic 10/12.5 once a day.  3.  Nexium 40 mg a day.  4.  Aspirin 81 mg a day.  5.  Lipitor 10 mg a day.  6.  Flomax 0.4 mg a day.  7.  Plavix 75 mg a day.   LABORATORY DATA:  White count is 10.0,  hemoglobin 12.8, hematocrit 36.8,  platelets 155.  His BMP is pending; preoperatively, his creatinine was 4.4.  Preop INR was  1.2.  Urinalysis is unremarkable.  EKG shows sinus rhythm,  incomplete right bundle branch block. He did have a Cardiolite study January  2005 that suggested mild inferior ischemia, low-risk. Catheterization done  by Dr. Katrinka Blazing in 1999 showed occlusion of the native LAD, native RCA and  moderate disease in mid circumflex with severe left main stenosis and normal  LV function.  The patient had occlusion of the SVG to the diagonal.  The SVG  to the RCA and OM was patent, and LIMA to the LAD was patent.   DISPOSITION:  The patient was discharged in stable condition and will follow-  up with Dr. Clarene Duke.  He will need follow-up outpatient Dopplers.  Will  follow-up his BMP prior to discharge and hold his Zestoretic today.      Abelino Derrick, P.A.      Cristy Hilts. Jacinto Halim, MD  Electronically Signed  LKK/MEDQ  D:  12/16/2005  T:  12/16/2005  Job:  [id number]   cc:   Thereasa Solo. Little, M.D.  Fax: [phone]

## 2011-01-17 NOTE — Op Note (Signed)
NAME:  [name], [name] NO.:  [id number]   MEDICAL RECORD NO.:  [id number]          PATIENT TYPE:  OBV   LOCATION:  2807                         FACILITY:  [facility]   PHYSICIAN:  Cristy Hilts. Jacinto Halim, MD       DATE OF BIRTH:  07-17-1934   DATE OF PROCEDURE:  12/15/2005  DATE OF DISCHARGE:                                 OPERATIVE REPORT   ATTENDING CARDIOLOGIST:  Thereasa Solo. Little, MD.   REFERRING PHYSICIAN:  Talmadge Coventry, MD.   PROCEDURE PERFORMED:  1.  Right groin access and right iliac arteriography.  2.  Crossover into the left iliac artery and left iliac arteriography.  3.  Right femoral angiography with runoff.  4.  Left femoral angiography with femoral runoff.  5.  Left groin access.  6.  PTA and direct stenting of the left common iliac artery.  7.  PTA and direct stenting of the right common iliac artery.  8.  PTA and direct stenting of the right external iliac artery.  9.  Post stent dilatation, both left and right, using the same stent      balloon.   INDICATIONS FOR PROCEDURE:  [name] is a fairly active, 77-year-  old gentleman with history of coronary artery disease status post CABG in  1997, hypertension, and hyperlipidemia, who has been having Fontaine class  II-B claudication.  He had lower extremity arterial Dopplers, which were  markedly abnormal.  Because of his significant lifestyle and continued  claudication was brought to the catheterization lab to evaluate his  peripheral anatomy.  An angioplasty was performed after confirmation of high-  grade stenosis of both the iliac arteries.   ANGIOGRAPHIC DATA:  1.  Right iliac artery and the follow-through:  The right iliac artery and      the follow-through revealed the following:  The right common iliac      artery all the way from just distal to the ostium downwards was      moderately calcified with an 80 to 90% stenosis.  The right external      iliac artery had an 80% stenosis.   There was a significant dampening      with the 5-French catheter between the aorta and the femoral artery.      The femoral artery itself continued as an SFA with a three-vessel runoff      noted in the right leg; however, the flow in the right leg was slow.      There was no significant luminal obstruction.  The SFA showed mild      luminal irregularity.  2.  Left iliac artery with femoral runoff:  The left iliac artery with      femoral runoff revealed a 60 to 70% stenosis of the left common iliac      artery.  This was also moderately calcified.  There was a large, left,      internal iliac artery, which had faint collaterals to the right.  The      common iliac artery continues as an external iliac artery, which has  mild luminal irregularity.  The left femoral artery and left superficial      femoral artery are widely patent with mild luminal irregularity.  There      is again three-vessel runoff noted in the left lower extremity.  There      is again slow flow noted in the left lower extremity.   INTERVENTION DATA:  PTA and direct stenting of the left common iliac artery  with a 9.0 x 80 mm Protege circumflex binding stent.  This stent was post  dilated with a 9.0 x 38 mm OmniLink stent balloon at 4 atmospheric pressure  for 30 seconds x2.  The stenosis was reduced from 60 to 70% to 0% with no  residual pressure gradient across the lesion.  Excellent, brisk flow was  noted in the left iliac system.   Successful PTA and stenting of the right common iliac artery with a 9.0 x 38  mm OmniLink stent deployed at 8 atmospheric pressure for 60 seconds.  This  stent was overlapped into the right external iliac artery with a 9.0 x 60 mm  Protege stent.  The self-expanding stent was post dilated with the same  OmniLink stent balloon, that is a 9.0 x 38 mm OmniLink stent balloon at 6  atmospheric pressure for 45 seconds.  Overall, the stenosis of the right  common iliac artery was reduced  from 90% to 0%, and the right external iliac  artery from 80% to 0% with no residual pressure gradient across the iliac  arterial system.   Overall, 205 ml of contrast were utilized for diagnostic angiography.  Excellent results were documented.   RECOMMENDATIONS:  Patient will be continued on aspirin and Plavix.  We will  leave Plavix from a peripheral arterial standpoint, but if there is any  contraindication, Plavix can be stopped anytime after 2 to 4 weeks of  therapy.  I expect a good, long-term resultgiven the fact that there was  fairly good expansion with the stents and they are fairly large-sized  stents.   Again, continued risk factor modification is indicated.   A total of 205 ml of contrast were utilized for diagnostic and  interventional procedure.   TECHNIQUE OF THE PROCEDURE:  In the usual steriletechnique using a 5-French  right femoral artery access, a 5-French sheath was introduced into the right  common femoral artery.  Right iliac arteriography was performed.  Then,  using a crossover catheter and from the right femoral artery, I was able to  traverse through the common iliac artery, and the left common iliac artery  was selectively engaged, and angiography of the left common iliac artery was  performed.  Then, using a crossover end-hole catheter and using a 0.035-inch  Glidewire, the crossover catheter was gently advanced into the left external  iliac artery, and a left iliac arteriogram with femoral follow-through was  performed.  The catheter was then pulled back and pressure gradients were  carefully measured.  Then, the end-hole catheter was then pulled back at the  level of the right external iliac artery, and right external iliac  arteriogram with femoral runoff was performed on the right.   TECHNIQUE OF INTERVENTION:  Using radiographic landmark, the left femoral arterial access was then obtained.  A 7-French, 45 cm long sheath was  introduced into the  left common iliac artery over a 0.035-inch Wholey wire  and the sheath was advanced into the distal abdominal aorta.  In a similar  fashion, the right  5-French sheath was exchanged to a 45 cm, 7-French  sheath.  Then, using a 0.035-inch Wholey wire on the left and J-wire on the  right, I proceeded with deployment of a self-expanding 9.0 x 80 mm Protege  stent on the left common iliac artery.  After the stent deployment, the  activation was directed to the right iliac arterial system.  A 9.0 x 38 mm  OmniLink stent was deployed into the ostium of the right common iliac artery  at 8 atmospheric pressure for 60 seconds.  This stent balloon was then  returned, and a 9.0 x 60 mm Protege self-expanding stent was utilized, and  overlapping with the previously placed stent, the external iliac artery was  stented and then post dilated with a 9.0 x 38 mm OmniLink stent balloon at 6  atmospheric pressure for 45 seconds.  Excellent results were confirmed.  Then attention was directed to the left iliac arterial system and using the  same OmniLink stent balloon, the previously deployed self-expanding stent  was post dilated at 4 atmospheric pressure for 30 seconds and balloon was  deflated and angiography  was performed.  Excellent results were noted.  Then the [facility] wire and the  J-wire were withdrawn out of the body and the sheaths were sutured in place,  and patient was transferred to the recovery area in a stable condition.  Patient tolerated the procedure.  No immediate complications were noted.      Cristy Hilts. Jacinto Halim, MD  Electronically Signed     JRG/MEDQ  D:  12/15/2005  T:  12/15/2005  Job:  [id number]   cc:   Thereasa Solo. Little, M.D.  Fax: [phone]   Talmadge Coventry, M.D.  Fax: [phone]

## 2011-01-17 NOTE — Op Note (Signed)
NAME:  RUSHI, CHASEN                       ACCOUNT NO.:  [id number]   MEDICAL RECORD NO.:  [id number]                   PATIENT TYPE:  AMB   LOCATION:  ENDO                                 FACILITY:  [facility]   PHYSICIAN:  Anselmo Rod, M.D.               DATE OF BIRTH:  01-12-1934   DATE OF PROCEDURE:  06/02/2003  DATE OF DISCHARGE:                                 OPERATIVE REPORT   PROCEDURE PERFORMED:  Colonoscopy with cold biopsies x4 and snare  polypectomy x1.   ENDOSCOPIST:  Anselmo Rod, M.D.   INSTRUMENT USED:  Olympus video colonoscope.   INDICATION FOR PROCEDURE:  A 77 year old white male with a history of  colonic polyps removed in the past, undergoing a repeat colonoscopy to rule  out recurrent polyps.   PREPROCEDURE PREPARATION:  Informed consent was procured from the patient.  The patient was fasted for eight hours prior to the procedure and prepped  with a bottle of magnesium citrate and a gallon of GoLYTELY the night prior  to the procedure.   PREPROCEDURE PHYSICAL:  VITAL SIGNS:  The patient had stable vital signs.  NECK:  Supple.  CHEST:  Clear to auscultation.  S1, S2 regular.  ABDOMEN:  Soft with normal bowel sounds.   DESCRIPTION OF PROCEDURE:  The patient was placed in the left lateral  decubitus position and sedated with 50 mg of Demerol and 6 mg of Versed  intravenously.  Once the patient was adequately sedate and maintained on low-  flow oxygen and continuous cardiac monitoring, the Olympus video colonoscope  was advanced from the rectum to the cecum and terminal ileum without  difficulty.  The patient had a few scattered diverticula throughout the  colon.  Small internal hemorrhoids were appreciated on retroflexion.  A  prominent anal papilla was also seen.  Three small sessile polyps were  biopsied from the rectosigmoid area at 15 cm.  Another 5-6 mm sessile polyp  was snared from the rectosigmoid area.  The rest of the colonic mucosa and  proximal left colon, transverse colon, right colon, cecum, and TI appeared  normal.   IMPRESSION:  1. Small internal hemorrhoids.  2. Prominent anal papilla seen on retroflexion.  3. Three small sessile polyps and a 5-6 mm polyp removed from the     rectosigmoid area (see description above).  4. Scattered diverticulosis.   RECOMMENDATIONS:  1. Await pathology results.  2.     Avoid nonsteroidals including aspirin for the next two weeks.  3. A high-fiber diet with liberal fluid intake to prevent worsening of     diverticular disease.  4. Outpatient follow-up in the next two weeks for further recommendations.  Anselmo Rod, M.D.    JNM/MEDQ  D:  06/02/2003  T:  06/02/2003  Job:  [id number]   cc:   Talmadge Coventry, M.D.  [address], [address]  [place]  [place] [address]  Fax: [phone]

## 2011-03-16 ENCOUNTER — Inpatient Hospital Stay: Payer: Self-pay | Admitting: Internal Medicine

## 2011-03-22 ENCOUNTER — Emergency Department: Payer: Self-pay | Admitting: Emergency Medicine

## 2011-05-14 ENCOUNTER — Ambulatory Visit: Payer: Self-pay | Admitting: Family Medicine

## 2011-05-14 ENCOUNTER — Encounter: Payer: Self-pay | Admitting: Internal Medicine

## 2011-05-24 ENCOUNTER — Emergency Department: Payer: Self-pay | Admitting: Emergency Medicine

## 2011-05-30 ENCOUNTER — Ambulatory Visit: Payer: Self-pay | Admitting: Family Medicine

## 2011-06-17 ENCOUNTER — Ambulatory Visit: Payer: Self-pay | Admitting: Family Medicine

## 2011-09-08 DIAGNOSIS — K120 Recurrent oral aphthae: Secondary | ICD-10-CM | POA: Diagnosis not present

## 2011-09-11 DIAGNOSIS — E291 Testicular hypofunction: Secondary | ICD-10-CM | POA: Diagnosis not present

## 2011-09-11 DIAGNOSIS — N529 Male erectile dysfunction, unspecified: Secondary | ICD-10-CM | POA: Diagnosis not present

## 2011-09-11 DIAGNOSIS — N138 Other obstructive and reflux uropathy: Secondary | ICD-10-CM | POA: Diagnosis not present

## 2011-09-11 DIAGNOSIS — N401 Enlarged prostate with lower urinary tract symptoms: Secondary | ICD-10-CM | POA: Diagnosis not present

## 2011-09-24 DIAGNOSIS — H353190 Nonexudative age-related macular degeneration, unspecified eye, stage unspecified: Secondary | ICD-10-CM | POA: Diagnosis not present

## 2011-09-30 DIAGNOSIS — J305 Allergic rhinitis due to food: Secondary | ICD-10-CM | POA: Diagnosis not present

## 2011-09-30 DIAGNOSIS — L439 Lichen planus, unspecified: Secondary | ICD-10-CM | POA: Diagnosis not present

## 2011-09-30 DIAGNOSIS — K120 Recurrent oral aphthae: Secondary | ICD-10-CM | POA: Diagnosis not present

## 2011-09-30 DIAGNOSIS — J301 Allergic rhinitis due to pollen: Secondary | ICD-10-CM | POA: Diagnosis not present

## 2011-10-01 DIAGNOSIS — L439 Lichen planus, unspecified: Secondary | ICD-10-CM | POA: Diagnosis not present

## 2011-10-01 DIAGNOSIS — B370 Candidal stomatitis: Secondary | ICD-10-CM | POA: Diagnosis not present

## 2011-10-05 DIAGNOSIS — B370 Candidal stomatitis: Secondary | ICD-10-CM | POA: Diagnosis not present

## 2011-10-14 ENCOUNTER — Ambulatory Visit: Payer: Self-pay | Admitting: Family Medicine

## 2011-10-14 DIAGNOSIS — R509 Fever, unspecified: Secondary | ICD-10-CM | POA: Diagnosis not present

## 2011-10-14 DIAGNOSIS — J029 Acute pharyngitis, unspecified: Secondary | ICD-10-CM | POA: Diagnosis not present

## 2011-10-14 DIAGNOSIS — R05 Cough: Secondary | ICD-10-CM | POA: Diagnosis not present

## 2011-10-14 DIAGNOSIS — J449 Chronic obstructive pulmonary disease, unspecified: Secondary | ICD-10-CM | POA: Diagnosis not present

## 2011-10-14 DIAGNOSIS — R059 Cough, unspecified: Secondary | ICD-10-CM | POA: Diagnosis not present

## 2011-10-29 DIAGNOSIS — D1039 Benign neoplasm of other parts of mouth: Secondary | ICD-10-CM | POA: Diagnosis not present

## 2011-10-29 DIAGNOSIS — L439 Lichen planus, unspecified: Secondary | ICD-10-CM | POA: Diagnosis not present

## 2011-10-30 DIAGNOSIS — L439 Lichen planus, unspecified: Secondary | ICD-10-CM | POA: Diagnosis not present

## 2011-11-12 DIAGNOSIS — L439 Lichen planus, unspecified: Secondary | ICD-10-CM | POA: Diagnosis not present

## 2011-12-08 DIAGNOSIS — I2510 Atherosclerotic heart disease of native coronary artery without angina pectoris: Secondary | ICD-10-CM | POA: Diagnosis not present

## 2011-12-08 DIAGNOSIS — I10 Essential (primary) hypertension: Secondary | ICD-10-CM | POA: Diagnosis not present

## 2011-12-08 DIAGNOSIS — I4891 Unspecified atrial fibrillation: Secondary | ICD-10-CM | POA: Diagnosis not present

## 2012-01-01 DIAGNOSIS — L439 Lichen planus, unspecified: Secondary | ICD-10-CM | POA: Diagnosis not present

## 2012-01-02 DIAGNOSIS — B370 Candidal stomatitis: Secondary | ICD-10-CM | POA: Diagnosis not present

## 2012-01-08 DIAGNOSIS — G2581 Restless legs syndrome: Secondary | ICD-10-CM | POA: Diagnosis not present

## 2012-01-08 DIAGNOSIS — R5381 Other malaise: Secondary | ICD-10-CM | POA: Diagnosis not present

## 2012-01-08 DIAGNOSIS — K219 Gastro-esophageal reflux disease without esophagitis: Secondary | ICD-10-CM | POA: Diagnosis not present

## 2012-01-08 DIAGNOSIS — R5383 Other fatigue: Secondary | ICD-10-CM | POA: Diagnosis not present

## 2012-01-09 DIAGNOSIS — R5381 Other malaise: Secondary | ICD-10-CM | POA: Diagnosis not present

## 2012-01-09 DIAGNOSIS — I10 Essential (primary) hypertension: Secondary | ICD-10-CM | POA: Diagnosis not present

## 2012-01-09 DIAGNOSIS — R5383 Other fatigue: Secondary | ICD-10-CM | POA: Diagnosis not present

## 2012-01-15 DIAGNOSIS — N3289 Other specified disorders of bladder: Secondary | ICD-10-CM | POA: Diagnosis not present

## 2012-02-13 DIAGNOSIS — R351 Nocturia: Secondary | ICD-10-CM | POA: Diagnosis not present

## 2012-02-13 DIAGNOSIS — N400 Enlarged prostate without lower urinary tract symptoms: Secondary | ICD-10-CM | POA: Diagnosis not present

## 2012-02-13 DIAGNOSIS — R350 Frequency of micturition: Secondary | ICD-10-CM | POA: Diagnosis not present

## 2012-02-23 DIAGNOSIS — N400 Enlarged prostate without lower urinary tract symptoms: Secondary | ICD-10-CM | POA: Diagnosis not present

## 2012-02-25 DIAGNOSIS — N400 Enlarged prostate without lower urinary tract symptoms: Secondary | ICD-10-CM | POA: Diagnosis not present

## 2012-03-03 DIAGNOSIS — N138 Other obstructive and reflux uropathy: Secondary | ICD-10-CM | POA: Diagnosis not present

## 2012-03-03 DIAGNOSIS — N323 Diverticulum of bladder: Secondary | ICD-10-CM | POA: Diagnosis not present

## 2012-03-03 DIAGNOSIS — N401 Enlarged prostate with lower urinary tract symptoms: Secondary | ICD-10-CM | POA: Diagnosis not present

## 2012-03-22 DIAGNOSIS — N400 Enlarged prostate without lower urinary tract symptoms: Secondary | ICD-10-CM | POA: Diagnosis not present

## 2012-03-23 DIAGNOSIS — H3530 Unspecified macular degeneration: Secondary | ICD-10-CM | POA: Diagnosis not present

## 2012-03-30 DIAGNOSIS — N400 Enlarged prostate without lower urinary tract symptoms: Secondary | ICD-10-CM | POA: Diagnosis not present

## 2012-04-01 DIAGNOSIS — M5430 Sciatica, unspecified side: Secondary | ICD-10-CM | POA: Diagnosis not present

## 2012-04-19 DIAGNOSIS — M25559 Pain in unspecified hip: Secondary | ICD-10-CM | POA: Diagnosis not present

## 2012-04-19 DIAGNOSIS — M47817 Spondylosis without myelopathy or radiculopathy, lumbosacral region: Secondary | ICD-10-CM | POA: Diagnosis not present

## 2012-04-19 DIAGNOSIS — M25579 Pain in unspecified ankle and joints of unspecified foot: Secondary | ICD-10-CM | POA: Diagnosis not present

## 2012-04-22 ENCOUNTER — Ambulatory Visit: Payer: Self-pay | Admitting: Orthopedic Surgery

## 2012-04-26 ENCOUNTER — Ambulatory Visit: Payer: Self-pay | Admitting: Orthopedic Surgery

## 2012-04-26 DIAGNOSIS — M5137 Other intervertebral disc degeneration, lumbosacral region: Secondary | ICD-10-CM | POA: Diagnosis not present

## 2012-04-26 DIAGNOSIS — M51379 Other intervertebral disc degeneration, lumbosacral region without mention of lumbar back pain or lower extremity pain: Secondary | ICD-10-CM | POA: Diagnosis not present

## 2012-04-26 DIAGNOSIS — M5126 Other intervertebral disc displacement, lumbar region: Secondary | ICD-10-CM | POA: Diagnosis not present

## 2012-05-04 DIAGNOSIS — R351 Nocturia: Secondary | ICD-10-CM | POA: Diagnosis not present

## 2012-05-13 DIAGNOSIS — IMO0002 Reserved for concepts with insufficient information to code with codable children: Secondary | ICD-10-CM | POA: Diagnosis not present

## 2012-05-13 DIAGNOSIS — M5126 Other intervertebral disc displacement, lumbar region: Secondary | ICD-10-CM | POA: Diagnosis not present

## 2012-05-13 DIAGNOSIS — M47817 Spondylosis without myelopathy or radiculopathy, lumbosacral region: Secondary | ICD-10-CM | POA: Diagnosis not present

## 2012-05-21 DIAGNOSIS — M5126 Other intervertebral disc displacement, lumbar region: Secondary | ICD-10-CM | POA: Diagnosis not present

## 2012-05-21 DIAGNOSIS — M47817 Spondylosis without myelopathy or radiculopathy, lumbosacral region: Secondary | ICD-10-CM | POA: Diagnosis not present

## 2012-05-21 DIAGNOSIS — IMO0002 Reserved for concepts with insufficient information to code with codable children: Secondary | ICD-10-CM | POA: Diagnosis not present

## 2012-06-09 DIAGNOSIS — M48061 Spinal stenosis, lumbar region without neurogenic claudication: Secondary | ICD-10-CM | POA: Diagnosis not present

## 2012-06-17 DIAGNOSIS — R5383 Other fatigue: Secondary | ICD-10-CM | POA: Diagnosis not present

## 2012-06-17 DIAGNOSIS — R5381 Other malaise: Secondary | ICD-10-CM | POA: Diagnosis not present

## 2012-06-17 DIAGNOSIS — K219 Gastro-esophageal reflux disease without esophagitis: Secondary | ICD-10-CM | POA: Diagnosis not present

## 2012-06-17 DIAGNOSIS — J069 Acute upper respiratory infection, unspecified: Secondary | ICD-10-CM | POA: Diagnosis not present

## 2012-06-23 DIAGNOSIS — N401 Enlarged prostate with lower urinary tract symptoms: Secondary | ICD-10-CM | POA: Diagnosis not present

## 2012-06-23 DIAGNOSIS — N138 Other obstructive and reflux uropathy: Secondary | ICD-10-CM | POA: Diagnosis not present

## 2012-06-25 DIAGNOSIS — M5126 Other intervertebral disc displacement, lumbar region: Secondary | ICD-10-CM | POA: Diagnosis not present

## 2012-06-25 DIAGNOSIS — IMO0002 Reserved for concepts with insufficient information to code with codable children: Secondary | ICD-10-CM | POA: Diagnosis not present

## 2012-06-25 DIAGNOSIS — M47817 Spondylosis without myelopathy or radiculopathy, lumbosacral region: Secondary | ICD-10-CM | POA: Diagnosis not present

## 2012-06-25 DIAGNOSIS — M48061 Spinal stenosis, lumbar region without neurogenic claudication: Secondary | ICD-10-CM | POA: Diagnosis not present

## 2012-06-29 DIAGNOSIS — I2510 Atherosclerotic heart disease of native coronary artery without angina pectoris: Secondary | ICD-10-CM | POA: Diagnosis not present

## 2012-06-29 DIAGNOSIS — E782 Mixed hyperlipidemia: Secondary | ICD-10-CM | POA: Diagnosis not present

## 2012-06-29 DIAGNOSIS — Z951 Presence of aortocoronary bypass graft: Secondary | ICD-10-CM | POA: Diagnosis not present

## 2012-07-01 DIAGNOSIS — E782 Mixed hyperlipidemia: Secondary | ICD-10-CM | POA: Diagnosis not present

## 2012-07-01 DIAGNOSIS — Z79899 Other long term (current) drug therapy: Secondary | ICD-10-CM | POA: Diagnosis not present

## 2012-07-09 DIAGNOSIS — M47817 Spondylosis without myelopathy or radiculopathy, lumbosacral region: Secondary | ICD-10-CM | POA: Diagnosis not present

## 2012-07-21 ENCOUNTER — Encounter: Payer: Self-pay | Admitting: Vascular Surgery

## 2012-08-04 DIAGNOSIS — L439 Lichen planus, unspecified: Secondary | ICD-10-CM | POA: Diagnosis not present

## 2012-08-23 ENCOUNTER — Encounter: Payer: Self-pay | Admitting: Neurosurgery

## 2012-08-27 DIAGNOSIS — N529 Male erectile dysfunction, unspecified: Secondary | ICD-10-CM | POA: Diagnosis not present

## 2012-08-27 DIAGNOSIS — E291 Testicular hypofunction: Secondary | ICD-10-CM | POA: Diagnosis not present

## 2012-09-06 ENCOUNTER — Ambulatory Visit: Payer: Self-pay | Admitting: Family Medicine

## 2012-09-06 ENCOUNTER — Other Ambulatory Visit: Payer: Self-pay | Admitting: *Deleted

## 2012-09-06 DIAGNOSIS — I739 Peripheral vascular disease, unspecified: Secondary | ICD-10-CM

## 2012-09-06 DIAGNOSIS — J449 Chronic obstructive pulmonary disease, unspecified: Secondary | ICD-10-CM | POA: Diagnosis not present

## 2012-09-06 DIAGNOSIS — Z48812 Encounter for surgical aftercare following surgery on the circulatory system: Secondary | ICD-10-CM

## 2012-09-06 DIAGNOSIS — I10 Essential (primary) hypertension: Secondary | ICD-10-CM | POA: Diagnosis not present

## 2012-09-06 DIAGNOSIS — R413 Other amnesia: Secondary | ICD-10-CM | POA: Diagnosis not present

## 2012-09-06 DIAGNOSIS — R0602 Shortness of breath: Secondary | ICD-10-CM | POA: Diagnosis not present

## 2012-09-06 DIAGNOSIS — Z0000 Encounter for general adult medical examination without abnormal findings: Secondary | ICD-10-CM | POA: Diagnosis not present

## 2012-09-07 DIAGNOSIS — R0602 Shortness of breath: Secondary | ICD-10-CM | POA: Diagnosis not present

## 2012-09-07 DIAGNOSIS — I10 Essential (primary) hypertension: Secondary | ICD-10-CM | POA: Diagnosis not present

## 2012-09-07 DIAGNOSIS — R413 Other amnesia: Secondary | ICD-10-CM | POA: Diagnosis not present

## 2012-09-08 ENCOUNTER — Encounter: Payer: Self-pay | Admitting: Neurosurgery

## 2012-09-09 ENCOUNTER — Ambulatory Visit (INDEPENDENT_AMBULATORY_CARE_PROVIDER_SITE_OTHER): Payer: Medicare Other | Admitting: Neurosurgery

## 2012-09-09 ENCOUNTER — Encounter: Payer: Self-pay | Admitting: Neurosurgery

## 2012-09-09 ENCOUNTER — Encounter (INDEPENDENT_AMBULATORY_CARE_PROVIDER_SITE_OTHER): Payer: Medicare Other | Admitting: *Deleted

## 2012-09-09 ENCOUNTER — Other Ambulatory Visit (INDEPENDENT_AMBULATORY_CARE_PROVIDER_SITE_OTHER): Payer: Medicare Other | Admitting: *Deleted

## 2012-09-09 VITALS — BP 124/80 | HR 67 | Resp 16 | Ht 72.0 in | Wt 225.0 lb

## 2012-09-09 DIAGNOSIS — I739 Peripheral vascular disease, unspecified: Secondary | ICD-10-CM | POA: Diagnosis not present

## 2012-09-09 DIAGNOSIS — I6529 Occlusion and stenosis of unspecified carotid artery: Secondary | ICD-10-CM

## 2012-09-09 DIAGNOSIS — Z48812 Encounter for surgical aftercare following surgery on the circulatory system: Secondary | ICD-10-CM

## 2012-09-09 NOTE — Progress Notes (Signed)
[name] Car[facility] Note  CC: Carotid surveillance ABIs Referring Physician: [name]  History of Present Illness: 79 year old male patient of Dr. Darrick Foster status post left CEA in October 2007. The patient denies any signs or symptoms of CVA, TIA, amaurosis fugax or any neural deficit. The patient denies claudication or rest pain and states he uses his treadmill or elliptical everyday.  Past Medical History  Diagnosis Date  . Hypertension   . Hyperlipidemia   . Occlusion and stenosis of carotid artery without mention of cerebral infarction   . CAD (coronary artery disease)   . GERD (gastroesophageal reflux disease)   . S/P T&A (status post tonsillectomy and adenoidectomy)   . Aorto-iliac disease     ROS: [x]  Positive   [ ]  Denies    General: [ ]  Weight loss, [ ]  Fever, [ ]  chills Neurologic: [ ]  Dizziness, [ ]  Blackouts, [ ]  Seizure [ ]  Stroke, [ ]  "Mini stroke", [ ]  Slurred speech, [ ]  Temporary blindness; [ ]  weakness in arms or legs, [ ]  Hoarseness Cardiac: [ ]  Chest pain/pressure, [ ]  Shortness of breath at rest [ ]  Shortness of breath with exertion, [ ]  Atrial fibrillation or irregular heartbeat Vascular: [ ]  Pain in legs with walking, [ ]  Pain in legs at rest, [ ]  Pain in legs at night,  [ ]  Non-healing ulcer, [ ]  Blood clot in vein/DVT,   Pulmonary: [ ]  Home oxygen, [ ]  Productive cough, [ ]  Coughing up blood, [ ]  Asthma,  [ ]  Wheezing Musculoskeletal:  [ ]  Arthritis, [ ]  Low back pain, [ ]  Joint pain Hematologic: [ ]  Easy Bruising, [ ]  Anemia; [ ]  Hepatitis Gastrointestinal: [ ]  Blood in stool, [ ]  Gastroesophageal Reflux/heartburn, [ ]  Trouble swallowing Urinary: [ ]  chronic Kidney disease, [ ]  on HD - [ ]  MWF or [ ]  TTHS, [ ]  Burning with urination, [ ]  Difficulty urinating Skin: [ ]  Rashes, [ ]  Wounds Psychological: [ ]  Anxiety, [ ]  Depression   Social History History  Substance Use Topics  . Smoking status: Former Smoker    Types:  Cigarettes  . Smokeless tobacco: Former Neurosurgeon    Quit date: 08/04/1997  . Alcohol Use: No    Family History Family History  Problem Relation Age of Onset  . Ulcers Mother     Peptic  . Dementia Mother   . Alcohol abuse Father     Not on File  Current Outpatient Prescriptions  Medication Sig Dispense Refill  . aspirin 81 MG chewable tablet Chew 81 mg by mouth daily.      [name] atorvastatin (LIPITOR) 40 MG tablet Take 40 mg by mouth daily.      . citalopram (CELEXA) 10 MG tablet Take 10 mg by mouth daily.      [name] esomeprazole (NEXIUM) 40 MG capsule Take 40 mg by mouth daily before breakfast.      . ezetimibe-simvastatin (VYTORIN) 10-10 MG per tablet Take 1 tablet by mouth at bedtime.      . fluticasone (FLONASE) 50 MCG/ACT nasal spray Place 2 sprays into the nose daily.      [name] lisinopril-hydrochlorothiazide (PRINZIDE,ZESTORETIC) 10-12.5 MG per tablet Take 1 tablet by mouth daily.      . metoprolol succinate (TOPROL-XL) 50 MG 24 hr tablet Take 50 mg by mouth daily. Take with or immediately following a meal.      . Multiple Vitamin (MULTIVITAMIN) tablet Take 1 tablet by mouth daily.      [name]  zolpidem (AMBIEN) 10 MG tablet Take 10 mg by mouth at bedtime as needed.        Physical Examination  Filed Vitals:   09/09/12 1011  BP: 124/80  Pulse: 67  Resp:     Body mass index is 30.52 kg/(m^2).  General:  WDWN in NAD Gait: Normal HEENT: WNL Eyes: Pupils equal Pulmonary: normal non-labored breathing , without Rales, rhonchi,  wheezing Cardiac: RRR, without  Murmurs, rubs or gallops; Abdomen: soft, NT, no masses Skin: no rashes, ulcers noted  Vascular Exam Pulses: Palpable femoral pulses bilaterally, lower extremity pulses are palpable, 3+ radial pulses bilaterally Carotid bruits: Carotid pulses to auscultation no bruits are heard Extremities without ischemic changes, no Gangrene , no cellulitis; no open wounds;  Musculoskeletal: no muscle wasting or atrophy   Neurologic: A&O X  3; Appropriate Affect ; SENSATION: normal; MOTOR FUNCTION:  moving all extremities equally. Speech is fluent/normal  Non-Invasive Vascular Imaging CAROTID DUPLEX 09/09/2012  Right ICA 60 - 79 % stenosis Left ICA 0 - 19% stenosis ABIs today are 0.98 and biphasic on the right, 1.04 and biphasic on the left  ASSESSMENT/PLAN: Asymptomatic patient will followup in 6 months with repeat carotid duplex in one year with repeat ABIs. The patient knows the signs and symptoms of CVA and knows to call 911 should this occur. The patient's questions were encouraged and answered, he is in agreement with this plan.  Lauree Chandler ANP   Clinic MD: [name]

## 2012-09-10 NOTE — Addendum Note (Signed)
Addended by: Sharee Pimple on: 09/10/2012 08:45 AM   Modules accepted: Orders

## 2012-09-16 DIAGNOSIS — B351 Tinea unguium: Secondary | ICD-10-CM | POA: Diagnosis not present

## 2012-09-16 DIAGNOSIS — M79609 Pain in unspecified limb: Secondary | ICD-10-CM | POA: Diagnosis not present

## 2012-09-27 ENCOUNTER — Other Ambulatory Visit: Payer: Self-pay | Admitting: *Deleted

## 2012-09-27 DIAGNOSIS — I6529 Occlusion and stenosis of unspecified carotid artery: Secondary | ICD-10-CM

## 2012-10-01 ENCOUNTER — Emergency Department: Payer: Self-pay | Admitting: Emergency Medicine

## 2012-10-01 DIAGNOSIS — K8020 Calculus of gallbladder without cholecystitis without obstruction: Secondary | ICD-10-CM | POA: Diagnosis not present

## 2012-10-01 DIAGNOSIS — I714 Abdominal aortic aneurysm, without rupture, unspecified: Secondary | ICD-10-CM | POA: Diagnosis not present

## 2012-10-01 DIAGNOSIS — K5289 Other specified noninfective gastroenteritis and colitis: Secondary | ICD-10-CM | POA: Diagnosis not present

## 2012-10-01 DIAGNOSIS — I10 Essential (primary) hypertension: Secondary | ICD-10-CM | POA: Diagnosis not present

## 2012-10-01 DIAGNOSIS — R6889 Other general symptoms and signs: Secondary | ICD-10-CM | POA: Diagnosis not present

## 2012-10-01 DIAGNOSIS — E7800 Pure hypercholesterolemia, unspecified: Secondary | ICD-10-CM | POA: Diagnosis not present

## 2012-10-01 DIAGNOSIS — Z951 Presence of aortocoronary bypass graft: Secondary | ICD-10-CM | POA: Diagnosis not present

## 2012-10-01 LAB — URINALYSIS, COMPLETE
Bacteria: NONE SEEN
Bilirubin,UR: NEGATIVE
Blood: NEGATIVE
Glucose,UR: NEGATIVE mg/dL (ref 0–75)
Hyaline Cast: 1
Ketone: NEGATIVE
Leukocyte Esterase: NEGATIVE
Nitrite: NEGATIVE
Ph: 5 (ref 4.5–8.0)
Protein: NEGATIVE
RBC,UR: NONE SEEN /HPF (ref 0–5)
Specific Gravity: 1.011 (ref 1.003–1.030)
Squamous Epithelial: <1
WBC UR: 1 /HPF (ref 0–5)

## 2012-10-01 LAB — LIPASE, BLOOD: Lipase: 127 U/L (ref 73–393)

## 2012-10-01 LAB — CBC WITH DIFFERENTIAL/PLATELET
Basophil #: 0.2 10*3/uL — ABNORMAL HIGH (ref 0.0–0.1)
Basophil %: 1.1 %
Eosinophil #: 0.2 10*3/uL (ref 0.0–0.7)
Eosinophil %: 1 %
HCT: 56.3 % — ABNORMAL HIGH (ref 40.0–52.0)
HGB: 18.5 g/dL — ABNORMAL HIGH (ref 13.0–18.0)
Lymphocyte #: 2.4 10*3/uL (ref 1.0–3.6)
Lymphocyte %: 15 %
MCH: 30.8 pg (ref 26.0–34.0)
MCHC: 32.8 g/dL (ref 32.0–36.0)
MCV: 94 fL (ref 80–100)
Monocyte #: 0.7 x10 3/mm (ref 0.2–1.0)
Monocyte %: 4.6 %
Neutrophil #: 12.6 10*3/uL — ABNORMAL HIGH (ref 1.4–6.5)
Neutrophil %: 78.3 %
Platelet: 171 10*3/uL (ref 150–440)
RBC: 5.99 10*6/uL — ABNORMAL HIGH (ref 4.40–5.90)
RDW: 13 % (ref 11.5–14.5)
WBC: 16 10*3/uL — ABNORMAL HIGH (ref 3.8–10.6)

## 2012-10-01 LAB — COMPREHENSIVE METABOLIC PANEL
Albumin: 4.7 g/dL (ref 3.4–5.0)
Alkaline Phosphatase: 103 U/L (ref 50–136)
Anion Gap: 9 (ref 7–16)
BUN: 18 mg/dL (ref 7–18)
Bilirubin,Total: 1.2 mg/dL — ABNORMAL HIGH (ref 0.2–1.0)
Calcium, Total: 9.8 mg/dL (ref 8.5–10.1)
Chloride: 108 mmol/L — ABNORMAL HIGH (ref 98–107)
Co2: 27 mmol/L (ref 21–32)
Creatinine: 1.55 mg/dL — ABNORMAL HIGH (ref 0.60–1.30)
EGFR (African American): 50 — ABNORMAL LOW
EGFR (Non-African Amer.): 43 — ABNORMAL LOW
Glucose: 126 mg/dL — ABNORMAL HIGH (ref 65–99)
Osmolality: 290 (ref 275–301)
Potassium: 3.9 mmol/L (ref 3.5–5.1)
SGOT(AST): 34 U/L (ref 15–37)
SGPT (ALT): 39 U/L (ref 12–78)
Sodium: 144 mmol/L (ref 136–145)
Total Protein: 8.9 g/dL — ABNORMAL HIGH (ref 6.4–8.2)

## 2012-10-18 DIAGNOSIS — Z23 Encounter for immunization: Secondary | ICD-10-CM | POA: Diagnosis not present

## 2012-10-18 DIAGNOSIS — I10 Essential (primary) hypertension: Secondary | ICD-10-CM | POA: Diagnosis not present

## 2012-10-18 DIAGNOSIS — E538 Deficiency of other specified B group vitamins: Secondary | ICD-10-CM | POA: Diagnosis not present

## 2012-11-02 DIAGNOSIS — N400 Enlarged prostate without lower urinary tract symptoms: Secondary | ICD-10-CM | POA: Diagnosis not present

## 2012-11-02 DIAGNOSIS — R350 Frequency of micturition: Secondary | ICD-10-CM | POA: Diagnosis not present

## 2012-11-02 DIAGNOSIS — E291 Testicular hypofunction: Secondary | ICD-10-CM | POA: Diagnosis not present

## 2012-11-09 DIAGNOSIS — C44319 Basal cell carcinoma of skin of other parts of face: Secondary | ICD-10-CM | POA: Diagnosis not present

## 2012-11-09 DIAGNOSIS — C44111 Basal cell carcinoma of skin of unspecified eyelid, including canthus: Secondary | ICD-10-CM | POA: Diagnosis not present

## 2012-11-09 DIAGNOSIS — L821 Other seborrheic keratosis: Secondary | ICD-10-CM | POA: Diagnosis not present

## 2012-11-09 DIAGNOSIS — L570 Actinic keratosis: Secondary | ICD-10-CM | POA: Diagnosis not present

## 2012-11-09 DIAGNOSIS — D485 Neoplasm of uncertain behavior of skin: Secondary | ICD-10-CM | POA: Diagnosis not present

## 2012-11-09 DIAGNOSIS — D0430 Carcinoma in situ of skin of unspecified part of face: Secondary | ICD-10-CM | POA: Diagnosis not present

## 2012-12-13 DIAGNOSIS — I2510 Atherosclerotic heart disease of native coronary artery without angina pectoris: Secondary | ICD-10-CM | POA: Diagnosis not present

## 2012-12-13 DIAGNOSIS — R0989 Other specified symptoms and signs involving the circulatory and respiratory systems: Secondary | ICD-10-CM | POA: Diagnosis not present

## 2012-12-13 DIAGNOSIS — E782 Mixed hyperlipidemia: Secondary | ICD-10-CM | POA: Diagnosis not present

## 2012-12-13 DIAGNOSIS — I10 Essential (primary) hypertension: Secondary | ICD-10-CM | POA: Diagnosis not present

## 2012-12-13 DIAGNOSIS — R0609 Other forms of dyspnea: Secondary | ICD-10-CM | POA: Diagnosis not present

## 2012-12-30 HISTORY — PX: OTHER SURGICAL HISTORY: SHX169

## 2013-01-03 DIAGNOSIS — C44111 Basal cell carcinoma of skin of unspecified eyelid, including canthus: Secondary | ICD-10-CM | POA: Diagnosis not present

## 2013-01-03 DIAGNOSIS — L918 Other hypertrophic disorders of the skin: Secondary | ICD-10-CM | POA: Diagnosis not present

## 2013-01-03 DIAGNOSIS — Z85828 Personal history of other malignant neoplasm of skin: Secondary | ICD-10-CM | POA: Diagnosis not present

## 2013-01-03 DIAGNOSIS — I789 Disease of capillaries, unspecified: Secondary | ICD-10-CM | POA: Diagnosis not present

## 2013-01-03 DIAGNOSIS — L908 Other atrophic disorders of skin: Secondary | ICD-10-CM | POA: Diagnosis not present

## 2013-01-03 HISTORY — DX: Basal cell carcinoma of skin of unspecified eyelid, including canthus: C44.111

## 2013-01-05 DIAGNOSIS — E782 Mixed hyperlipidemia: Secondary | ICD-10-CM | POA: Diagnosis not present

## 2013-01-05 DIAGNOSIS — Z79899 Other long term (current) drug therapy: Secondary | ICD-10-CM | POA: Diagnosis not present

## 2013-01-12 ENCOUNTER — Encounter (INDEPENDENT_AMBULATORY_CARE_PROVIDER_SITE_OTHER): Payer: Medicare Other

## 2013-01-12 DIAGNOSIS — R0602 Shortness of breath: Secondary | ICD-10-CM

## 2013-01-12 LAB — PULMONARY FUNCTION TEST

## 2013-01-13 ENCOUNTER — Encounter: Payer: Self-pay | Admitting: *Deleted

## 2013-01-15 ENCOUNTER — Encounter: Payer: Self-pay | Admitting: Cardiology

## 2013-01-17 ENCOUNTER — Encounter: Payer: Self-pay | Admitting: Cardiology

## 2013-01-17 ENCOUNTER — Telehealth: Payer: Self-pay | Admitting: Cardiology

## 2013-01-17 ENCOUNTER — Ambulatory Visit (INDEPENDENT_AMBULATORY_CARE_PROVIDER_SITE_OTHER): Payer: Medicare Other | Admitting: Cardiology

## 2013-01-17 VITALS — BP 150/80 | HR 60 | Ht 72.0 in | Wt 221.7 lb

## 2013-01-17 DIAGNOSIS — I7409 Other arterial embolism and thrombosis of abdominal aorta: Secondary | ICD-10-CM

## 2013-01-17 DIAGNOSIS — I10 Essential (primary) hypertension: Secondary | ICD-10-CM

## 2013-01-17 DIAGNOSIS — E663 Overweight: Secondary | ICD-10-CM

## 2013-01-17 DIAGNOSIS — I2510 Atherosclerotic heart disease of native coronary artery without angina pectoris: Secondary | ICD-10-CM

## 2013-01-17 DIAGNOSIS — Z951 Presence of aortocoronary bypass graft: Secondary | ICD-10-CM | POA: Insufficient documentation

## 2013-01-17 DIAGNOSIS — E785 Hyperlipidemia, unspecified: Secondary | ICD-10-CM | POA: Diagnosis not present

## 2013-01-17 DIAGNOSIS — Z6825 Body mass index (BMI) 25.0-25.9, adult: Secondary | ICD-10-CM

## 2013-01-17 MED ORDER — METOPROLOL TARTRATE 50 MG PO TABS: 25.0000 mg | ORAL_TABLET | Freq: Two times a day (BID) | ORAL | Status: DC

## 2013-01-17 MED ORDER — METOPROLOL TARTRATE 25 MG PO TABS: 25.0000 mg | ORAL_TABLET | Freq: Two times a day (BID) | ORAL | Status: DC

## 2013-01-17 MED ORDER — CHLORTHALIDONE 25 MG PO TABS: 25.0000 mg | ORAL_TABLET | Freq: Every day | ORAL | Status: DC

## 2013-01-17 NOTE — Telephone Encounter (Signed)
Pt was seen today---there is a question about the dosage of Metroprolol 50mg 

## 2013-01-17 NOTE — Progress Notes (Signed)
[facility]   Patient ID: [name], male   DOB: 11-May-1934, 79 y.o.   MRN: [id number]  Clinic Note: HPI: [name] is a 79 y.o. male with a PMH below who presents today for followup of his CPET test.  Interval History: As you recall he is a very pleasant 67 severe old gentleman who I took over as his cardiologist at his long-standing patient of Dr. Clarene Duke. My last saw him he was noting feeling a little more now than usual and more short of breath and fatigued than usual. To evaluate this region the cardiac pulmonary exercise test which she did relatively well on that is not quite able to get his heart rate up to maximal effort. He was submaximal with effort and with that had a high moderately abnormal oxygen consumption level is 75%. Notably though his heart rate only got up to a peak of 121 which is only 80% of predicted and therefore this oxygen consumption would probably be an underestimation of his true function. Despite this he denied having any anginal symptoms during the exercise portion. The assessment was that he had ischemic dysfunction with decreased stroke volume and increasing work rate with some mild EKG changes noted during the last minute of exercise.  Am not exactly sure how to interpret the results based on the fact that he is certainly not high risk with oxygen consumption a 75% with submaximal effort. He denied any anginal symptoms. At this point I think he he still is relatively low to moderate risk of significant ischemic coronary disease.  Since his visit he still has some general feeling of fatigue and less exercise tolerance but he has been going to the gym more frequently. He's been trying to his weight. He has lost about 3 pounds since last visit. Otherwise he seems relatively stable cardiac standpoint and an generally healthy.  Review of his Cardiovascular ROS is as follows: positive for - Exercise related fatigue and mild  shortness of breath with significant exertion. negative for - chest pain, edema, irregular heartbeat, loss of consciousness, murmur, orthopnea, palpitations, paroxysmal nocturnal dyspnea, rapid heart rate or shortness of breath Additional cardiac review of systems: Lightheadedness - no, dizzinesscno, syncope/near-syncope - no; TIA/amaurosis fugax - no Melena - no, hematochezia no; hematuria - no; nosebleeds - no; claudication - no  Allergies  Allergen Reactions  . Codeine   . Niacin And Related     Current Outpatient Prescriptions  Medication Sig Dispense Refill  . Ashwagandha Extract 2.5 % POWD by Does not apply route.      [name] aspirin 81 MG chewable tablet Chew 81 mg by mouth daily.      . cholecalciferol (VITAMIN D) 400 UNITS TABS Take 400 Units by mouth.      . dexlansoprazole (DEXILANT) 60 MG capsule Take 60 mg by mouth daily.      . eszopiclone (LUNESTA) 2 MG TABS Take 2 mg by mouth at bedtime. Take immediately before bedtime      . ezetimibe (ZETIA) 10 MG tablet Take 10 mg by mouth daily.      . finasteride (PROSCAR) 5 MG tablet Take 5 mg by mouth daily.      . fish oil-omega-3 fatty acids 1000 MG capsule Take 2 g by mouth daily.      . Multiple Vitamin (MULTIVITAMIN) tablet Take 1 tablet by mouth daily.      . rosuvastatin (CRESTOR) 20 MG tablet Take 20 mg by mouth  daily.      . tadalafil (CIALIS) 5 MG tablet Take 5 mg by mouth daily as needed.      . Testosterone (AXIRON) 30 MG/ACT SOLN Place onto the skin.      . fluticasone (FLONASE) 50 MCG/ACT nasal spray Place 2 sprays into the nose daily.      [name] lisinopril (PRINIVIL,ZESTRIL) 20 MG tablet       . metoprolol tartrate (LOPRESSOR) 50 MG tablet Take 1 tablet (25 mg total) by mouth in the a.m. and 1/2 tab in the p.m.[name]  180 tablet  3   No current facility-administered medications for this visit.    Past Medical History  Diagnosis Date  . Hypertension   . Hyperlipidemia   . Occlusion and stenosis of carotid artery without  mention of cerebral infarction   . CAD (coronary artery disease)   . GERD (gastroesophageal reflux disease)   . S/P T&A (status post tonsillectomy and adenoidectomy)   . Aorto-iliac disease   . Erectile dysfunction   . Sciatic pain     chronic  . OSA on CPAP     Past Surgical History  Procedure Laterality Date  . Stents  April 16,2007    Bilateral  iliac a stents  . Appendectomy    . Carotid endarterectomy  Oct. 29, 2007    Left  cea  . Coronary artery bypass graft  1998  . Hiatal hernia repair    . Ankle surgery    . Doppler echocardiography  11/20/2010    EF =>55%; LV norm mild aortic scelorosis  . Nm myocar perf wall motion  11/11/2010    STRESS----NORMAL PERFUSION ,EF 68%  . Cardiac catheterization  01/30/1998     total native LAD andRCA ,mod native CIRC; svg to RCA and OBTUSE MARG. PATENT the left internal mammary artery graft to LAD ; LV NORMAL  /  . Carotid doppler  03/16/2011  . Arch aortogram and carotid aortogram  06/02/2006    Dr Darrick Penna did surgery  . Iliac arteriography  12/15/2005    PTA and direct stenting rgt and lft common iliac arteries  . Cpet/met  12/2012    History   Social History  . Marital Status: Widowed    Spouse Name: N/A    Number of Children: N/A  . Years of Education: N/A   Occupational History  . Not on file.   Social History Main Topics  . Smoking status: Former Smoker    Types: Cigarettes  . Smokeless tobacco: Former Neurosurgeon    Quit date: 08/04/1997  . Alcohol Use: No  . Drug Use: No  . Sexually Active: Not on file   Other Topics Concern  . Not on file   Social History Narrative  . No narrative on file    ROS: A comprehensive Review of Systems - Negative except Pertinent positives noted above.  PHYSICAL EXAM BP 150/80  Pulse 60  Ht 6' (1.829 m)  Wt 221 lb 11.2 oz (100.562 kg)  BMI 30.06 kg/m2 General appearance: alert, cooperative, appears stated age, no distress and mildly obese Neck: no adenopathy, no carotid bruit  and no JVD; head is normocephalic/atraumatic; extraocular muscles intact, anicteric sclera. MMM. Lungs: clear to auscultation bilaterally, normal percussion bilaterally and non-labored Heart: regular rate and rhythm, S1, split S2 normal, click, rub or gallop, nondisplaced PMI; 1/6 SEM heard at RUSB, crescendo-decrescendo, early peaking Abdomen: soft, non-tender; bowel sounds normal; no masses,  no organomegaly; no HJR; mild truncal obesity. Extremities: extremities  normal, atraumatic, no cyanosis, and edema; no signs of venous stasis Pulses: 2+ and symmetric; Skin: normal, mobility and turgor normal, no evidence of bleeding or bruising, no lesions noted and temperature normal Neurologic: Mental status: Alert, oriented, thought content appropriate Cranial nerves: normal (II-XII grossly intact)  [id number] today: No  ASSESSMENT: Relatively stable overall, low to slightly moderate risk CPET test. Not overly helpful. Would preferentially use Myoview/Cardiolite for followup testing.  1. CAD (coronary artery disease) 2. Hypertension - Basic Metabolic Panel (BMET) 3. Hyperlipidemia 4. Aorto-iliac disease 5. Overweight (BMI 25.0-29.9)  PLAN: Orders Placed This Encounter  Procedures  . Basic Metabolic Panel (BMET)    Order Specific Question:  Has the patient fasted?    Answer:  No   Meds ordered this encounter  Medications  . lisinopril (PRINIVIL,ZESTRIL) 20 MG tablet    Sig:   . DISCONTD: metoprolol (LOPRESSOR) 50 MG tablet    Sig: Taking 1 tablet in the morning and 1/2 tablet in the evening  . DISCONTD: metoprolol (LOPRESSOR) 50 MG tablet; start 25 mg twice a day     Dispense:  60 tablet    Refill:  12  . chlorthalidone (HYGROTON) 25 MG tablet    Sig: Take 1 tablet (25 mg total) by mouth daily.    Dispense:  30 tablet    Refill:  12   Followup 3 months  Daryus Sowash W, M.D., M.S. [facility] [address] [place], [place]   [address]  [phone] Pager # [phone] 01/18/2013 12:52 AM

## 2013-01-17 NOTE — Telephone Encounter (Signed)
Corrected Rx sent to [facility] pharmacy. LM with new RX Metoprolol Tart 25mg  BID

## 2013-01-17 NOTE — Patient Instructions (Addendum)
Your physician has recommended you make the following change in your medication Metoprol tart. 25 mg  One tablet twice a day.  Start Chlororthalidone  25mg  one tablet  Daily.  Your physician recommends that you return for lab work in one week. Please go to [facility].  You will have BMP done.  ....Marland KitchenMarland KitchenYour physician recommends that you schedule a follow-up appointment in 3 months

## 2013-01-18 NOTE — Assessment & Plan Note (Signed)
With the reduced dose of beta blocker will add chlorthalidone 25 mg. Followup labs in roughly one week to monitor electrolytes and renal function.

## 2013-01-18 NOTE — Assessment & Plan Note (Signed)
Overall, it is readily minimal loss of 3 pounds from last visit. He still has some weight to go. I think this may have somebody with his decreased exercise tolerance simply due to his weight being up. I have reinforced importance of continued exercise and dietary modification.

## 2013-01-18 NOTE — Assessment & Plan Note (Addendum)
Relatively stable. No active anginal symptoms. 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 At this point I think continued current medical therapy is warranted. As his heart rate did have some difficulty increased and decreased level of will decrease his beta blocker to 25 mg twice a day from 50 mg in the morning and 25 mg at night. Continue ACE inhibitor, aspirin and statin along with omega-3 fatty acids and Zetia -- will need to review lipids on followup visit.

## 2013-01-18 NOTE — Assessment & Plan Note (Signed)
No active claudication symptoms. He seemed to tolerate the bicycle without to much trouble. We'll need to review when he is due for repeat Dopplers.

## 2013-01-22 NOTE — Progress Notes (Signed)
We need to remind him to get his labs checked.  Marykay Lex, MD

## 2013-01-25 DIAGNOSIS — K589 Irritable bowel syndrome without diarrhea: Secondary | ICD-10-CM | POA: Diagnosis not present

## 2013-01-25 DIAGNOSIS — Z23 Encounter for immunization: Secondary | ICD-10-CM | POA: Diagnosis not present

## 2013-01-25 DIAGNOSIS — K219 Gastro-esophageal reflux disease without esophagitis: Secondary | ICD-10-CM | POA: Diagnosis not present

## 2013-01-25 DIAGNOSIS — K5900 Constipation, unspecified: Secondary | ICD-10-CM | POA: Diagnosis not present

## 2013-01-27 DIAGNOSIS — I10 Essential (primary) hypertension: Secondary | ICD-10-CM | POA: Diagnosis not present

## 2013-01-28 LAB — BASIC METABOLIC PANEL
BUN/Creatinine Ratio: 19 (ref 10–22)
BUN: 24 mg/dL (ref 8–27)
CO2: 25 mmol/L (ref 19–28)
Calcium: 9.8 mg/dL (ref 8.6–10.2)
Chloride: 100 mmol/L (ref 97–108)
Creatinine, Ser: 1.25 mg/dL (ref 0.76–1.27)
GFR calc Af Amer: 64 mL/min/{1.73_m2} (ref >59)
GFR calc non Af Amer: 55 mL/min/{1.73_m2} — ABNORMAL LOW (ref >59)
Glucose: 90 mg/dL (ref 65–99)
Potassium: 4.2 mmol/L (ref 3.5–5.2)
Sodium: 139 mmol/L (ref 134–144)

## 2013-02-01 ENCOUNTER — Other Ambulatory Visit: Payer: Self-pay | Admitting: *Deleted

## 2013-02-01 MED ORDER — EZETIMIBE 10 MG PO TABS: 10.0000 mg | ORAL_TABLET | Freq: Every day | ORAL | Status: DC

## 2013-02-01 NOTE — Telephone Encounter (Signed)
Pt request refill of Zetia.  Refill sent to pharmacy via escribed

## 2013-02-16 DIAGNOSIS — C44319 Basal cell carcinoma of skin of other parts of face: Secondary | ICD-10-CM | POA: Diagnosis not present

## 2013-02-25 DIAGNOSIS — N400 Enlarged prostate without lower urinary tract symptoms: Secondary | ICD-10-CM | POA: Diagnosis not present

## 2013-02-25 DIAGNOSIS — E291 Testicular hypofunction: Secondary | ICD-10-CM | POA: Diagnosis not present

## 2013-02-25 DIAGNOSIS — L259 Unspecified contact dermatitis, unspecified cause: Secondary | ICD-10-CM | POA: Diagnosis not present

## 2013-02-25 DIAGNOSIS — R350 Frequency of micturition: Secondary | ICD-10-CM | POA: Diagnosis not present

## 2013-02-25 DIAGNOSIS — N529 Male erectile dysfunction, unspecified: Secondary | ICD-10-CM | POA: Diagnosis not present

## 2013-03-10 ENCOUNTER — Other Ambulatory Visit: Payer: Medicare Other

## 2013-03-10 ENCOUNTER — Ambulatory Visit: Payer: Medicare Other | Admitting: Neurosurgery

## 2013-03-21 ENCOUNTER — Other Ambulatory Visit (INDEPENDENT_AMBULATORY_CARE_PROVIDER_SITE_OTHER): Payer: Medicare Other | Admitting: *Deleted

## 2013-03-21 DIAGNOSIS — Z48812 Encounter for surgical aftercare following surgery on the circulatory system: Secondary | ICD-10-CM

## 2013-03-21 DIAGNOSIS — I6529 Occlusion and stenosis of unspecified carotid artery: Secondary | ICD-10-CM | POA: Diagnosis not present

## 2013-03-22 ENCOUNTER — Other Ambulatory Visit: Payer: Self-pay | Admitting: *Deleted

## 2013-03-23 ENCOUNTER — Encounter: Payer: Self-pay | Admitting: Vascular Surgery

## 2013-03-29 ENCOUNTER — Encounter: Payer: Self-pay | Admitting: Cardiology

## 2013-03-29 ENCOUNTER — Ambulatory Visit (INDEPENDENT_AMBULATORY_CARE_PROVIDER_SITE_OTHER): Payer: Medicare Other | Admitting: Cardiology

## 2013-03-29 VITALS — BP 130/72 | HR 72 | Ht 72.0 in | Wt 208.9 lb

## 2013-03-29 DIAGNOSIS — I779 Disorder of arteries and arterioles, unspecified: Secondary | ICD-10-CM

## 2013-03-29 DIAGNOSIS — R0609 Other forms of dyspnea: Secondary | ICD-10-CM | POA: Diagnosis not present

## 2013-03-29 DIAGNOSIS — Z6825 Body mass index (BMI) 25.0-25.9, adult: Secondary | ICD-10-CM

## 2013-03-29 DIAGNOSIS — E663 Overweight: Secondary | ICD-10-CM

## 2013-03-29 DIAGNOSIS — R0989 Other specified symptoms and signs involving the circulatory and respiratory systems: Secondary | ICD-10-CM | POA: Diagnosis not present

## 2013-03-29 DIAGNOSIS — I6529 Occlusion and stenosis of unspecified carotid artery: Secondary | ICD-10-CM

## 2013-03-29 DIAGNOSIS — I7409 Other arterial embolism and thrombosis of abdominal aorta: Secondary | ICD-10-CM

## 2013-03-29 DIAGNOSIS — E785 Hyperlipidemia, unspecified: Secondary | ICD-10-CM | POA: Diagnosis not present

## 2013-03-29 DIAGNOSIS — I2510 Atherosclerotic heart disease of native coronary artery without angina pectoris: Secondary | ICD-10-CM | POA: Diagnosis not present

## 2013-03-29 DIAGNOSIS — G4733 Obstructive sleep apnea (adult) (pediatric): Secondary | ICD-10-CM

## 2013-03-29 DIAGNOSIS — Z9989 Dependence on other enabling machines and devices: Secondary | ICD-10-CM

## 2013-03-29 DIAGNOSIS — I10 Essential (primary) hypertension: Secondary | ICD-10-CM | POA: Diagnosis not present

## 2013-03-29 DIAGNOSIS — R0600 Dyspnea, unspecified: Secondary | ICD-10-CM

## 2013-03-29 DIAGNOSIS — N529 Male erectile dysfunction, unspecified: Secondary | ICD-10-CM

## 2013-03-29 NOTE — Patient Instructions (Addendum)
Congratulations on the weight loss!!! Keep it up.  Exercise is great for you. Your Blood Pressure & Heart Rate look good - we had recently dropped your Metoprolol dose b/c you were not able to get the heart rate up with exercise like you are supposed to on the Bicycle test.   We will probably recheck one of those tests next year after I see you back.  Keep up the good work!!  Marykay Lex, MD

## 2013-03-29 NOTE — Progress Notes (Signed)
Patient ID: AWS SHERE, male   DOB: 1934/07/12, 79 y.o.   MRN: [id number]  PCP: Lorie Phenix, MD  Clinic Note: Chief Complaint  Patient presents with  . ROV 3 months    No complaints    HPI: [name] is a 79 y.o. male with a PMH below who presents today for three-month followup for his coronary artery disease with fatigue and dyspnea on exertion. We evaluated this with a CPET test back in May. [name] did relatively well, did not meet his goal for exercise. So with submaximal effort getting started to only 82% of predicted being 101 beats a minute. [name] reached a moderate level peak VO2 of 75%, however with maximal effort was concerned that there was component of chronotropic incompetence. As a result of this, we backed down his beta blocker from 50 mg twice a day to 25 mg twice a day. There was also some evidence of ischemic dysfunction at end of exercise period  Interval History: Since last visit, [name] still doing okay. [name] is actually developing at this level of exercise on the treadmill and stationary bicycle. [name] states [name] really not overly concerned about the shortness of breath and is not associated any chest pressure chest tightness. [name] still does get was short of breath [name] goes up an incline but has no PND or orthopnea. [name] still does have a little trouble going to sleep, and staying asleep  The remainder of cardiac review of systems is as follows: Cardiovascular ROS: negative for - edema, irregular heartbeat, loss of consciousness, murmur, orthopnea, palpitations, paroxysmal nocturnal dyspnea or rapid heart rate : Additional cardiac review of systems: Lightheadedness - no, dizzinesscno, syncope/near-syncope - no; TIA/amaurosis fugax - no Melena - no, hematochezia no; hematuria - no; nosebleeds - no; claudication - no  Past Medical History  Diagnosis Date  . Hypertension   . Hyperlipidemia   . GERD (gastroesophageal reflux disease)   . S/P T&A (status post tonsillectomy and  adenoidectomy)   . Aorto-iliac disease   . Erectile dysfunction   . Sciatic pain     chronic  . OSA on CPAP   . Occlusion and stenosis of carotid artery without mention of cerebral infarction     s/p Left CEA  . CAD in native artery 1998    S/p CABG x 4 (LIMA-LAD, SVG-RCA, SVG-OM, SVG-DIAG); SVG-Diag occluded on cath 1999   Prior Cardiac Evaluation and Past Surgical History: Past Surgical History  Procedure Laterality Date  . Stents  April 16,2007    Bilateral  iliac a stents  . Appendectomy    . Coronary artery bypass graft  1998    LIMA-LAD, SVG-OM, SVG-RPDA, SVG-DIAG  . Hiatal hernia repair    . Ankle surgery    . Doppler echocardiography  11/20/2010    EF =>55%; LV norm mild aortic scelorosis  . Nm myocar perf wall motion  11/11/2010    STRESS----NORMAL PERFUSION ,EF 68%  . Carotid doppler  03/16/2011  . Arch aortogram and carotid aortogram  06/02/2006    Dr Darrick Penna did surgery  . Iliac arteriography  12/15/2005    PTA and direct stenting rgt and lft common iliac arteries  . Cpet/met  12/2012    Mild chronotropic incompetence; peak VO2 75% (did not reach Max effort)  . Carotid endarterectomy  Oct. 29, 2007    Left  cea  . Cardiac catheterization  01/30/1998     total native LAD andRCA ,mod native CIRC; svg to RCA and  OBTUSE MARG. PATENT the left internal mammary artery graft to LAD ; LV NORMAL  /    Allergies  Allergen Reactions  . Codeine   . Niacin And Related     Current Outpatient Prescriptions  Medication Sig Dispense Refill  . Ashwagandha Extract 2.5 % POWD by Does not apply route.      [name] aspirin 81 MG chewable tablet Chew 81 mg by mouth daily.      . chlorthalidone (HYGROTON) 25 MG tablet Take 1 tablet (25 mg total) by mouth daily.  30 tablet  12  . cholecalciferol (VITAMIN D) 400 UNITS TABS Take 400 Units by mouth.      . dexlansoprazole (DEXILANT) 60 MG capsule Take 60 mg by mouth daily.      . eszopiclone (LUNESTA) 2 MG TABS Take 2 mg by mouth at bedtime.  Take immediately before bedtime      . ezetimibe (ZETIA) 10 MG tablet Take 1 tablet (10 mg total) by mouth daily.  30 tablet  11  . finasteride (PROSCAR) 5 MG tablet Take 5 mg by mouth daily.      . fish oil-omega-3 fatty acids 1000 MG capsule Take 2 g by mouth daily.      [name] lisinopril (PRINIVIL,ZESTRIL) 20 MG tablet Take 20 mg by mouth daily.       . metoprolol tartrate (LOPRESSOR) 25 MG tablet Take 1 tablet (25 mg total) by mouth 2 (two) times daily.  180 tablet  3  . Multiple Vitamin (MULTIVITAMIN) tablet Take 1 tablet by mouth daily.      . rosuvastatin (CRESTOR) 20 MG tablet Take 20 mg by mouth daily.      . tadalafil (CIALIS) 5 MG tablet Take 5 mg by mouth daily as needed.      . Testosterone (AXIRON) 30 MG/ACT SOLN Place onto the skin.      [name] triamcinolone cream (KENALOG) 0.1 % Apply 1 application topically as needed.       No current facility-administered medications for this visit.   History   Social History Narrative   [name] is a widowed father of one, grandfather of 8 with 3 stepchildren. [name] has been working out vigorously at [employer] the basis. [name]'s up to doing 20 minutes on the stationary bike and 10-15 minutes on the treadmill. [name] doesn't smoke or drink -- quit smoking in 1998.    ROS: A comprehensive Review of Systems - Negative except Pertinent positives noted above. [name] does have chronic constipation and mild arthritic pains [name] has lost about 16 pounds, purposely.  PHYSICAL EXAM BP 130/72  Pulse 72  Ht 6' (1.829 m)  Wt 208 lb 14.4 oz (94.756 kg)  BMI 28.33 kg/m2 -- weight at time of last clinic was 224 pounds the General appearance: alert, cooperative, appears stated age, no distress and Right healthy-appearing, well-nourished and well-groomed. Noticeably trimmed down. Very pleasant. Answers questions probably. Neck: no adenopathy, no carotid bruit, no JVD and supple, symmetrical, trachea midline Lungs: clear to auscultation bilaterally, normal percussion bilaterally and  Nonlabored, good air movement Heart: regular rate and rhythm, S1: normal, S2: physiologically split, no S3 or S4, systolic murmur: systolic ejection 1/6, crescendo, decrescendo and Early peaking at 2nd right intercostal space, radiates to carotids and no rub Abdomen: soft, non-tender; bowel sounds normal; no masses,  no organomegaly and Still with mild residual truncal obesity Extremities: extremities normal, atraumatic, no cyanosis or edema and no edema, redness or tenderness in the calves or thighs Pulses: 2+  and symmetric Neurologic: Alert and oriented X 3, normal strength and tone. Normal symmetric reflexes. Normal coordination and gait  [id number] today: No Recent Labs: Chemistry panel checked, only notable difference his creatinine was 1.25. I don't see his lipid panel.  ASSESSMENT / PLAN:  Dyspnea on exertion - -essentially resolved with BB dose reduction & wgt loss I saw him back in April, thinking that his dyspnea was probably multifactorial. A good portion of the was probably because of his obesity and having gained a lot of weight. [name] also did have some chronotropic incompetence. While there was a mild component of ischemic abnormality at the end of exercise, for the most part his dyspnea is resolved with the weight loss and backing off his beta blocker dose.  Plan: Continue with current exercise regimen for weight reduction and conditioning. We will not titrate up beta blocker again. Recheck CPET test next year following routine visit, to evaluate for improvement.  CAD (coronary artery disease) - s/p CABG Actually doing very well. I was very impressive his weight loss. [name]'s on aspirin, low-dose beta blocker moderate dose ACE inhibitor along with Crestor and Zetia for lipid control.  As well as blood pressure stays stable, no need to make any changes.  Hypertension Blood pressure well controlled on current regimen in addition to the ACE inhibitor and beta blocker [name] also does  have chlorthalidone which helps with some edema and has definitely improved his blood pressure.  Hyperlipidemia Is currently unsteady a N. Crestor along with visual I suspect his weight loss his lipids will be much improved. Unfortunately I see that [name] had some labs checked but his lipid panel was not listed. Plan: Continue current regimen.  Overweight (BMI 25.0-29.9) -- Wgt down 16 lb This is a very impressive weight loss, do to diligent exercise and dietary modification. I congratulated him on his efforts and recommend [name] continue to work at it. His goal weight loss for the year would put him down to just over 200 pounds. [name] is well on his way to that.  I suspect is a weight loss continues, his blood pressure and lipid control will be improved as well.  Occlusion and stenosis of carotid artery without mention of cerebral infarction Stable carotid Dopplers done by vascular surgery.   Follows up with Dr. Fabienne Bruns.  Aorto-iliac disease Latest ABIs were 0.98 right and 1.04 left this was up from 0.54 right and 0.91 left pre-CABG  OSA on CPAP She is doing okay with his CPAP, and promises [name] is overall having problems with insomnia I recommended the top of his primary physician about options.  Erectile dysfunction [name] currently is prescription for Cialis and is also using testosterone placement therapy. I did spend time talking about the possible risks and concerns of hormone replacement therapy on minimal coronary disease. At this point, [name] is aware of the possibility, and is okay with continued use in moderation.  Followup: 12  DAVID W. Herbie Baltimore, M.D., M.S. [facility] [address] [place], [place]  [address]  [phone] Pager # [phone]

## 2013-04-05 ENCOUNTER — Encounter: Payer: Self-pay | Admitting: Cardiology

## 2013-04-05 DIAGNOSIS — G4733 Obstructive sleep apnea (adult) (pediatric): Secondary | ICD-10-CM | POA: Insufficient documentation

## 2013-04-05 NOTE — Assessment & Plan Note (Signed)
Actually doing very well. I was very impressive his weight loss. He's on aspirin, low-dose beta blocker moderate dose ACE inhibitor along with Crestor and Zetia for lipid control.  As well as blood pressure stays stable, no need to make any changes.

## 2013-04-05 NOTE — Assessment & Plan Note (Signed)
Is currently unsteady a N. Crestor along with visual I suspect his weight loss his lipids will be much improved. Unfortunately I see that he had some labs checked but his lipid panel was not listed. Plan: Continue current regimen.

## 2013-04-05 NOTE — Assessment & Plan Note (Signed)
He currently is prescription for Cialis and is also using testosterone placement therapy. I did spend time talking about the possible risks and concerns of hormone replacement therapy on minimal coronary disease. At this point, he is aware of the possibility, and is okay with continued use in moderation.

## 2013-04-05 NOTE — Assessment & Plan Note (Signed)
Blood pressure well controlled on current regimen in addition to the ACE inhibitor and beta blocker he also does have chlorthalidone which helps with some edema and has definitely improved his blood pressure.

## 2013-04-05 NOTE — Assessment & Plan Note (Signed)
She is doing okay with his CPAP, and promises he is overall having problems with insomnia I recommended the top of his primary physician about options.

## 2013-04-05 NOTE — Assessment & Plan Note (Addendum)
Stable carotid Dopplers done by vascular surgery.   Follows up with Dr. Fabienne Bruns.

## 2013-04-05 NOTE — Assessment & Plan Note (Signed)
Latest ABIs were 0.98 right and 1.04 left this was up from 0.54 right and 0.91 left pre-CABG

## 2013-04-05 NOTE — Assessment & Plan Note (Addendum)
I saw him back in April, thinking that his dyspnea was probably multifactorial. A good portion of the was probably because of his obesity and having gained a lot of weight. He also did have some chronotropic incompetence. While there was a mild component of ischemic abnormality at the end of exercise, for the most part his dyspnea is resolved with the weight loss and backing off his beta blocker dose.  Plan: Continue with current exercise regimen for weight reduction and conditioning. We will not titrate up beta blocker again. Recheck CPET test next year following routine visit, to evaluate for improvement.

## 2013-04-05 NOTE — Assessment & Plan Note (Signed)
This is a very impressive weight loss, do to diligent exercise and dietary modification. I congratulated him on his efforts and recommend he continue to work at it. His goal weight loss for the year would put him down to just over 200 pounds. He is well on his way to that.  I suspect is a weight loss continues, his blood pressure and lipid control will be improved as well.

## 2013-04-11 DIAGNOSIS — L570 Actinic keratosis: Secondary | ICD-10-CM | POA: Diagnosis not present

## 2013-04-11 DIAGNOSIS — D692 Other nonthrombocytopenic purpura: Secondary | ICD-10-CM | POA: Diagnosis not present

## 2013-04-11 DIAGNOSIS — Z85828 Personal history of other malignant neoplasm of skin: Secondary | ICD-10-CM | POA: Diagnosis not present

## 2013-04-24 DIAGNOSIS — S93609A Unspecified sprain of unspecified foot, initial encounter: Secondary | ICD-10-CM | POA: Diagnosis not present

## 2013-04-24 DIAGNOSIS — I10 Essential (primary) hypertension: Secondary | ICD-10-CM | POA: Diagnosis not present

## 2013-04-24 DIAGNOSIS — S8990XA Unspecified injury of unspecified lower leg, initial encounter: Secondary | ICD-10-CM | POA: Diagnosis not present

## 2013-04-24 DIAGNOSIS — Z87891 Personal history of nicotine dependence: Secondary | ICD-10-CM | POA: Diagnosis not present

## 2013-04-24 DIAGNOSIS — M79609 Pain in unspecified limb: Secondary | ICD-10-CM | POA: Diagnosis not present

## 2013-04-24 DIAGNOSIS — I2510 Atherosclerotic heart disease of native coronary artery without angina pectoris: Secondary | ICD-10-CM | POA: Diagnosis not present

## 2013-04-24 DIAGNOSIS — E785 Hyperlipidemia, unspecified: Secondary | ICD-10-CM | POA: Diagnosis not present

## 2013-05-16 DIAGNOSIS — M5137 Other intervertebral disc degeneration, lumbosacral region: Secondary | ICD-10-CM | POA: Diagnosis not present

## 2013-05-16 DIAGNOSIS — M461 Sacroiliitis, not elsewhere classified: Secondary | ICD-10-CM | POA: Diagnosis not present

## 2013-05-18 DIAGNOSIS — M545 Low back pain, unspecified: Secondary | ICD-10-CM | POA: Diagnosis not present

## 2013-05-18 DIAGNOSIS — M47817 Spondylosis without myelopathy or radiculopathy, lumbosacral region: Secondary | ICD-10-CM | POA: Diagnosis not present

## 2013-05-18 DIAGNOSIS — M5137 Other intervertebral disc degeneration, lumbosacral region: Secondary | ICD-10-CM | POA: Diagnosis not present

## 2013-05-18 DIAGNOSIS — M461 Sacroiliitis, not elsewhere classified: Secondary | ICD-10-CM | POA: Diagnosis not present

## 2013-05-24 DIAGNOSIS — M545 Low back pain, unspecified: Secondary | ICD-10-CM | POA: Diagnosis not present

## 2013-05-24 DIAGNOSIS — M47817 Spondylosis without myelopathy or radiculopathy, lumbosacral region: Secondary | ICD-10-CM | POA: Diagnosis not present

## 2013-05-24 DIAGNOSIS — M5137 Other intervertebral disc degeneration, lumbosacral region: Secondary | ICD-10-CM | POA: Diagnosis not present

## 2013-05-24 DIAGNOSIS — M461 Sacroiliitis, not elsewhere classified: Secondary | ICD-10-CM | POA: Diagnosis not present

## 2013-05-26 DIAGNOSIS — M461 Sacroiliitis, not elsewhere classified: Secondary | ICD-10-CM | POA: Diagnosis not present

## 2013-05-26 DIAGNOSIS — M47817 Spondylosis without myelopathy or radiculopathy, lumbosacral region: Secondary | ICD-10-CM | POA: Diagnosis not present

## 2013-05-26 DIAGNOSIS — M5137 Other intervertebral disc degeneration, lumbosacral region: Secondary | ICD-10-CM | POA: Diagnosis not present

## 2013-05-31 DIAGNOSIS — M5137 Other intervertebral disc degeneration, lumbosacral region: Secondary | ICD-10-CM | POA: Diagnosis not present

## 2013-05-31 DIAGNOSIS — M47817 Spondylosis without myelopathy or radiculopathy, lumbosacral region: Secondary | ICD-10-CM | POA: Diagnosis not present

## 2013-05-31 DIAGNOSIS — M461 Sacroiliitis, not elsewhere classified: Secondary | ICD-10-CM | POA: Diagnosis not present

## 2013-06-02 DIAGNOSIS — M545 Low back pain, unspecified: Secondary | ICD-10-CM | POA: Diagnosis not present

## 2013-06-02 DIAGNOSIS — M47817 Spondylosis without myelopathy or radiculopathy, lumbosacral region: Secondary | ICD-10-CM | POA: Diagnosis not present

## 2013-06-06 DIAGNOSIS — M47817 Spondylosis without myelopathy or radiculopathy, lumbosacral region: Secondary | ICD-10-CM | POA: Diagnosis not present

## 2013-06-06 DIAGNOSIS — M461 Sacroiliitis, not elsewhere classified: Secondary | ICD-10-CM | POA: Diagnosis not present

## 2013-06-06 DIAGNOSIS — M5137 Other intervertebral disc degeneration, lumbosacral region: Secondary | ICD-10-CM | POA: Diagnosis not present

## 2013-06-09 DIAGNOSIS — M545 Low back pain, unspecified: Secondary | ICD-10-CM | POA: Diagnosis not present

## 2013-06-09 DIAGNOSIS — M47817 Spondylosis without myelopathy or radiculopathy, lumbosacral region: Secondary | ICD-10-CM | POA: Diagnosis not present

## 2013-06-14 DIAGNOSIS — M47817 Spondylosis without myelopathy or radiculopathy, lumbosacral region: Secondary | ICD-10-CM | POA: Diagnosis not present

## 2013-06-14 DIAGNOSIS — M545 Low back pain, unspecified: Secondary | ICD-10-CM | POA: Diagnosis not present

## 2013-06-16 DIAGNOSIS — M545 Low back pain, unspecified: Secondary | ICD-10-CM | POA: Diagnosis not present

## 2013-06-16 DIAGNOSIS — M461 Sacroiliitis, not elsewhere classified: Secondary | ICD-10-CM | POA: Diagnosis not present

## 2013-06-16 DIAGNOSIS — M47817 Spondylosis without myelopathy or radiculopathy, lumbosacral region: Secondary | ICD-10-CM | POA: Diagnosis not present

## 2013-06-29 DIAGNOSIS — M461 Sacroiliitis, not elsewhere classified: Secondary | ICD-10-CM | POA: Diagnosis not present

## 2013-06-29 DIAGNOSIS — M47817 Spondylosis without myelopathy or radiculopathy, lumbosacral region: Secondary | ICD-10-CM | POA: Diagnosis not present

## 2013-06-29 DIAGNOSIS — M545 Low back pain, unspecified: Secondary | ICD-10-CM | POA: Diagnosis not present

## 2013-07-05 DIAGNOSIS — Z23 Encounter for immunization: Secondary | ICD-10-CM | POA: Diagnosis not present

## 2013-07-11 ENCOUNTER — Other Ambulatory Visit: Payer: Self-pay

## 2013-07-11 MED ORDER — LISINOPRIL 20 MG PO TABS: 20.0000 mg | ORAL_TABLET | Freq: Every day | ORAL | Status: DC

## 2013-07-11 NOTE — Telephone Encounter (Signed)
Rx was sent to pharmacy electronically. 

## 2013-07-18 DIAGNOSIS — H353190 Nonexudative age-related macular degeneration, unspecified eye, stage unspecified: Secondary | ICD-10-CM | POA: Diagnosis not present

## 2013-07-20 ENCOUNTER — Other Ambulatory Visit: Payer: Self-pay | Admitting: Neurosurgery

## 2013-07-20 DIAGNOSIS — I6529 Occlusion and stenosis of unspecified carotid artery: Secondary | ICD-10-CM

## 2013-07-20 DIAGNOSIS — Z48812 Encounter for surgical aftercare following surgery on the circulatory system: Secondary | ICD-10-CM

## 2013-07-27 DIAGNOSIS — L723 Sebaceous cyst: Secondary | ICD-10-CM | POA: Diagnosis not present

## 2013-08-06 DIAGNOSIS — R21 Rash and other nonspecific skin eruption: Secondary | ICD-10-CM | POA: Diagnosis not present

## 2013-08-09 ENCOUNTER — Ambulatory Visit: Payer: Self-pay | Admitting: Family Medicine

## 2013-08-09 DIAGNOSIS — L259 Unspecified contact dermatitis, unspecified cause: Secondary | ICD-10-CM | POA: Diagnosis not present

## 2013-08-09 DIAGNOSIS — R197 Diarrhea, unspecified: Secondary | ICD-10-CM | POA: Diagnosis not present

## 2013-08-09 DIAGNOSIS — J984 Other disorders of lung: Secondary | ICD-10-CM | POA: Diagnosis not present

## 2013-08-09 DIAGNOSIS — Z23 Encounter for immunization: Secondary | ICD-10-CM | POA: Diagnosis not present

## 2013-08-09 DIAGNOSIS — Z8711 Personal history of peptic ulcer disease: Secondary | ICD-10-CM | POA: Diagnosis not present

## 2013-08-10 DIAGNOSIS — L259 Unspecified contact dermatitis, unspecified cause: Secondary | ICD-10-CM | POA: Diagnosis not present

## 2013-08-23 DIAGNOSIS — E291 Testicular hypofunction: Secondary | ICD-10-CM | POA: Diagnosis not present

## 2013-08-23 DIAGNOSIS — N138 Other obstructive and reflux uropathy: Secondary | ICD-10-CM | POA: Diagnosis not present

## 2013-08-23 DIAGNOSIS — N401 Enlarged prostate with lower urinary tract symptoms: Secondary | ICD-10-CM | POA: Diagnosis not present

## 2013-08-23 DIAGNOSIS — N529 Male erectile dysfunction, unspecified: Secondary | ICD-10-CM | POA: Diagnosis not present

## 2013-09-07 DIAGNOSIS — L439 Lichen planus, unspecified: Secondary | ICD-10-CM | POA: Diagnosis not present

## 2013-09-08 ENCOUNTER — Other Ambulatory Visit: Payer: Medicare Other

## 2013-09-08 ENCOUNTER — Ambulatory Visit: Payer: Medicare Other | Admitting: Neurosurgery

## 2013-09-08 DIAGNOSIS — K219 Gastro-esophageal reflux disease without esophagitis: Secondary | ICD-10-CM | POA: Diagnosis not present

## 2013-09-08 DIAGNOSIS — K5730 Diverticulosis of large intestine without perforation or abscess without bleeding: Secondary | ICD-10-CM | POA: Diagnosis not present

## 2013-09-08 DIAGNOSIS — Z23 Encounter for immunization: Secondary | ICD-10-CM | POA: Diagnosis not present

## 2013-09-08 DIAGNOSIS — D126 Benign neoplasm of colon, unspecified: Secondary | ICD-10-CM | POA: Diagnosis not present

## 2013-09-21 ENCOUNTER — Encounter: Payer: Self-pay | Admitting: Vascular Surgery

## 2013-09-22 ENCOUNTER — Ambulatory Visit: Payer: Medicare Other | Admitting: Vascular Surgery

## 2013-09-22 ENCOUNTER — Ambulatory Visit (HOSPITAL_COMMUNITY)
Admission: RE | Admit: 2013-09-22 | Discharge: 2013-09-22 | Disposition: A | Discharge status: Home / Self Care | Payer: Medicare Other | Source: Ambulatory Visit | Attending: Vascular Surgery | Admitting: Vascular Surgery

## 2013-09-22 ENCOUNTER — Ambulatory Visit (INDEPENDENT_AMBULATORY_CARE_PROVIDER_SITE_OTHER)
Admission: RE | Admit: 2013-09-22 | Discharge: 2013-09-22 | Disposition: A | Discharge status: Home / Self Care | Payer: Medicare Other | Source: Ambulatory Visit | Attending: Vascular Surgery | Admitting: Vascular Surgery

## 2013-09-22 DIAGNOSIS — I6529 Occlusion and stenosis of unspecified carotid artery: Secondary | ICD-10-CM | POA: Diagnosis not present

## 2013-09-22 DIAGNOSIS — Z48812 Encounter for surgical aftercare following surgery on the circulatory system: Secondary | ICD-10-CM | POA: Diagnosis not present

## 2013-09-22 DIAGNOSIS — I739 Peripheral vascular disease, unspecified: Secondary | ICD-10-CM | POA: Diagnosis not present

## 2013-09-22 DIAGNOSIS — R12 Heartburn: Secondary | ICD-10-CM | POA: Diagnosis not present

## 2013-10-12 ENCOUNTER — Encounter: Payer: Self-pay | Admitting: Vascular Surgery

## 2013-10-13 ENCOUNTER — Ambulatory Visit (INDEPENDENT_AMBULATORY_CARE_PROVIDER_SITE_OTHER): Payer: Medicare Other | Admitting: Vascular Surgery

## 2013-10-13 ENCOUNTER — Encounter: Payer: Self-pay | Admitting: Vascular Surgery

## 2013-10-13 ENCOUNTER — Other Ambulatory Visit: Payer: Self-pay | Admitting: Neurosurgery

## 2013-10-13 ENCOUNTER — Ambulatory Visit (HOSPITAL_COMMUNITY)
Admission: RE | Admit: 2013-10-13 | Discharge: 2013-10-13 | Disposition: A | Discharge status: Home / Self Care | Payer: Medicare Other | Source: Ambulatory Visit | Attending: Vascular Surgery | Admitting: Vascular Surgery

## 2013-10-13 ENCOUNTER — Ambulatory Visit (HOSPITAL_COMMUNITY)
Admission: RE | Admit: 2013-10-13 | Discharge: 2013-10-13 | Disposition: A | Discharge status: Home / Self Care | Payer: Medicare Other | Source: Ambulatory Visit | Attending: Neurosurgery | Admitting: Neurosurgery

## 2013-10-13 VITALS — BP 101/66 | HR 75 | Resp 18 | Ht 72.0 in | Wt 205.9 lb

## 2013-10-13 DIAGNOSIS — I739 Peripheral vascular disease, unspecified: Secondary | ICD-10-CM

## 2013-10-13 DIAGNOSIS — I6529 Occlusion and stenosis of unspecified carotid artery: Secondary | ICD-10-CM | POA: Diagnosis not present

## 2013-10-13 NOTE — Progress Notes (Signed)
Patient is a 80 year old male who is status post left carotid endarterectomy in 2007. He returns today for followup. He denies any symptoms of TIA amaurosis or stroke. He is also had some complaints in his lower extremities in the past. He had bilateral ABIs performed today as well. He is currently on aspirin once daily. He denies any claudication symptoms at this point. However he states he has been diagnosed with sacroiliac joint problems and has some fatigue in his lower extremities with walking long distances. Chronic medical problems include hypertension, hyperlipidemia coronary artery disease all of which are currently stable. He is also previously had stenting of the left and right common iliac arteries  Review of systems: Denies shortness of breath. He denies chest pain  Past Medical History  Diagnosis Date  . Hypertension   . Hyperlipidemia   . GERD (gastroesophageal reflux disease)   . S/P T&A (status post tonsillectomy and adenoidectomy)   . Aorto-iliac disease   . Erectile dysfunction   . Sciatic pain     chronic  . OSA on CPAP   . Occlusion and stenosis of carotid artery without mention of cerebral infarction     s/p Left CEA  . CAD in native artery 1998    S/p CABG x 4 (LIMA-LAD, SVG-RCA, SVG-OM, SVG-DIAG); SVG-Diag occluded on cath 1999   Past Surgical History  Procedure Laterality Date  . Stents  April 16,2007    Bilateral  iliac a stents  . Appendectomy    . Coronary artery bypass graft  1998    LIMA-LAD, SVG-OM, SVG-RPDA, SVG-DIAG  . Hiatal hernia repair    . Ankle surgery    . Doppler echocardiography  11/20/2010    EF =>55%; LV norm mild aortic scelorosis  . Nm myocar perf wall motion  11/11/2010    STRESS----NORMAL PERFUSION ,EF 68%  . Carotid doppler  03/16/2011  . Arch aortogram and carotid aortogram  06/02/2006    Dr Oneida Alar did surgery  . Iliac arteriography  12/15/2005    PTA and direct stenting rgt and lft common iliac arteries  . Cpet/met  12/2012   Mild chronotropic incompetence; peak VO2 75% (did not reach Max effort)  . Carotid endarterectomy  Oct. 29, 2007    Left  cea  . Cardiac catheterization  01/30/1998     total native LAD andRCA ,mod native CIRC; svg to RCA and OBTUSE MARG. PATENT the left internal mammary artery graft to LAD ; LV NORMAL  /     Physical exam:  Filed Vitals:   10/13/13 1635 10/13/13 1636  BP: 117/70 101/66  Pulse: 56 75  Resp: 18   Height: 6' (1.829 m)   Weight: 205 lb 14.4 oz (93.396 kg)     Neck: No carotid bruits well-healed left neck scar  Chest: Clear to auscultation bilaterally  Cardiac: Regular rate and rhythm  Neuro: Symmetric upper and lower extremity motor strength 5 over 5 and symmetric  Extremities: 2+ femoral pulses bilaterally. Absent popliteal and pedal pulses. Left ankle slightly more swollen than right chronic according the patient  Data: Patient had bilateral carotid duplex exam today which showed 60-80% right internal carotid artery stenosis which is stable left internal carotid artery widely patent bilateral ABIs were also performed. ABI on the right was 0.91 left 0.85I  reviewed and interpreted both studies  Assessment: Doing well status post left carotid endarterectomy stable moderate right internal carotid artery stenosis stable bilateral lower extremity peripheral arterial disease essentially asymptomatic with reasonable ABIs  Plan: Followup one year repeat carotid duplex at that time.  Continue aspirin.  Ruta Hinds, MD [facility] Office: [phone] Pager: [phone]

## 2013-10-14 NOTE — Addendum Note (Signed)
Addended by: Dorthula Rue L on: 10/14/2013 09:46 AM   Modules accepted: Orders

## 2013-11-07 DIAGNOSIS — Z23 Encounter for immunization: Secondary | ICD-10-CM | POA: Diagnosis not present

## 2013-11-07 DIAGNOSIS — Z0000 Encounter for general adult medical examination without abnormal findings: Secondary | ICD-10-CM | POA: Diagnosis not present

## 2013-11-07 DIAGNOSIS — E7800 Pure hypercholesterolemia, unspecified: Secondary | ICD-10-CM | POA: Diagnosis not present

## 2013-11-07 DIAGNOSIS — I10 Essential (primary) hypertension: Secondary | ICD-10-CM | POA: Diagnosis not present

## 2013-11-11 DIAGNOSIS — E785 Hyperlipidemia, unspecified: Secondary | ICD-10-CM | POA: Diagnosis not present

## 2013-11-11 DIAGNOSIS — I10 Essential (primary) hypertension: Secondary | ICD-10-CM | POA: Diagnosis not present

## 2013-11-11 DIAGNOSIS — G4700 Insomnia, unspecified: Secondary | ICD-10-CM | POA: Diagnosis not present

## 2013-11-23 DIAGNOSIS — M461 Sacroiliitis, not elsewhere classified: Secondary | ICD-10-CM | POA: Diagnosis not present

## 2013-12-05 ENCOUNTER — Telehealth: Payer: Self-pay | Admitting: *Deleted

## 2013-12-05 DIAGNOSIS — Z0000 Encounter for general adult medical examination without abnormal findings: Secondary | ICD-10-CM | POA: Diagnosis not present

## 2013-12-05 DIAGNOSIS — G4700 Insomnia, unspecified: Secondary | ICD-10-CM | POA: Diagnosis not present

## 2013-12-05 DIAGNOSIS — I10 Essential (primary) hypertension: Secondary | ICD-10-CM | POA: Diagnosis not present

## 2013-12-05 DIAGNOSIS — Z23 Encounter for immunization: Secondary | ICD-10-CM | POA: Diagnosis not present

## 2013-12-05 NOTE — Telephone Encounter (Signed)
Pt's significant other called in concerned about [name]'s BP. Today the BP was 88/42 pulse 92. She stated that the bottom number has been steadily low. [name] took him off of [place] 25mg .   [place]

## 2013-12-05 NOTE — Telephone Encounter (Signed)
Returned a call to patient's significant other. She informs me the patient is having some issues with his hear rate and blood pressure. He has been seeing his PCP. His pcp has been changing around his medications. Explained to her that we cannot make any decisions over the phone and the patient hasn't seen Dr. Ellyn Hack in a year. She questioned should he wait until the may 8th appointment that he was given. I  Stated to her that I could not answer that question but if she was concerned enough to call the why not set her mind at ease. We can give him appointment with an extender on a day that Dr. Ellyn Hack is scheduled in the office ,therefore if the extender has any questions she can ask him. She agreed with plan. I will have the scheduler to call her back to see if he can be sen this Friday with Sandria Senter.

## 2013-12-09 ENCOUNTER — Encounter: Payer: Self-pay | Admitting: Cardiology

## 2013-12-09 ENCOUNTER — Ambulatory Visit (INDEPENDENT_AMBULATORY_CARE_PROVIDER_SITE_OTHER): Payer: Medicare Other | Admitting: Cardiology

## 2013-12-09 VITALS — BP 102/60 | HR 68 | Ht 72.0 in | Wt 211.0 lb

## 2013-12-09 DIAGNOSIS — Z9989 Dependence on other enabling machines and devices: Secondary | ICD-10-CM

## 2013-12-09 DIAGNOSIS — I2510 Atherosclerotic heart disease of native coronary artery without angina pectoris: Secondary | ICD-10-CM

## 2013-12-09 DIAGNOSIS — G4700 Insomnia, unspecified: Secondary | ICD-10-CM | POA: Insufficient documentation

## 2013-12-09 DIAGNOSIS — I959 Hypotension, unspecified: Secondary | ICD-10-CM | POA: Insufficient documentation

## 2013-12-09 DIAGNOSIS — R000 Tachycardia, unspecified: Secondary | ICD-10-CM | POA: Diagnosis not present

## 2013-12-09 DIAGNOSIS — I6529 Occlusion and stenosis of unspecified carotid artery: Secondary | ICD-10-CM | POA: Diagnosis not present

## 2013-12-09 DIAGNOSIS — G4733 Obstructive sleep apnea (adult) (pediatric): Secondary | ICD-10-CM

## 2013-12-09 MED ORDER — ALPRAZOLAM 0.25 MG PO TABS: 0.2500 mg | ORAL_TABLET | Freq: Every evening | ORAL | Status: DC | PRN

## 2013-12-09 MED ORDER — LISINOPRIL 5 MG PO TABS: 5.0000 mg | ORAL_TABLET | Freq: Every day | ORAL | Status: DC

## 2013-12-09 NOTE — Assessment & Plan Note (Signed)
Patient has trouble sleeping he is using his CPAP now but because of that, his sleep pattern is a little more distorted. His primary did not recommend Lunesta due to memory issues and the melatonin she did recommend is not helping. We discussed higher dose of melatonin. I have given Xanax 0.25 mg to him to try him, when necessary, to sleep with instructions that it can make him sleepy the next day,  also could cause memory issues, but we'll try.  If it does a good job without problems they know they must get it from their primary care provider not [place] for refills.

## 2013-12-09 NOTE — Assessment & Plan Note (Signed)
Has been using for the last 2 weeks

## 2013-12-09 NOTE — Assessment & Plan Note (Signed)
BP has been running 93/60, 80/55 today 102/60,  his PCP has decreased his Lopressor to 12 a half milligrams twice a day she stopped this fluid pill. Patient is without symptoms of dizziness lightheadedness or syncope. For today we will decrease lisinopril to 5 mg daily.

## 2013-12-09 NOTE — Patient Instructions (Signed)
Try the xanax at bedtime to sleep, they may make you sleepy the next day.  Monitor for memory changes.  Keep using [facility]  Follow up with Dr. Ellyn Hack in 5 weeks.  I decreased lisinopril to 5 mg daily.  Call if your BP > 140/80.  Call if your pulse is staying up,  > 95.

## 2013-12-09 NOTE — Assessment & Plan Note (Signed)
No chest pain or shortness of breath. 

## 2013-12-09 NOTE — Assessment & Plan Note (Signed)
Normally heart rate in the 60s but recently it's been climbing up to 100-105.  He is not aware of this. This may be due to decrease the beta blocker and his low blood pressure. Hopefully with resolution of the hypotension his heart rate will decrease if not would stop lisinopril completely and increase Lopressor back to 25 twice a day.

## 2013-12-09 NOTE — Progress Notes (Signed)
12/09/2013   PCP: Margarita Rana, MD   Chief Complaint  Patient presents with  . Follow-up    low BP    Primary Cardiologist:Dr. Ellyn Hack  HPI: 80 year old married white male presents today secondary to hypertension. He does have a history of coronary disease as well as peripheral vascular disease please see past medical history below.  Over the last month he's had problems with blood pressures 93/60, 80/55 and 72/40. This is also confirmed at his gastroenterologist office.  He has no dizziness no lightheadedness no syncope within these blood pressures.  He saw his primary care and she stopped chlorthalidone, decreased his lisinopril to 10 and then decrease his metoprolol to 12.5 mg twice a day.  His blood pressure continues to be low but now in the 90s most of the time- today in the office is 102/60.    He denies chest pain or shortness of breath he feels quite well he continues to exercise as well without having to slow down or stop.  Approximately 2 weeks ago he started using his CPAP with nasal pillows and has done fairly well but does take a long time to get sleep.  His primary care did not feel Lunesta was good for him and recommended melatonin.  He is asking if he could try something different I did discuss increasing melatonin 2 mg I did ask him not to take Benadryl as this sometimes also cause his memory issues.  I'll give him a trial of Xanax that he knows he must get refills from his primary care.     Allergies  Allergen Reactions  . Codeine   . Niacin And Related     Current Outpatient Prescriptions  Medication Sig Dispense Refill  . Ashwagandha Extract 2.5 % POWD by Does not apply route.      [name] aspirin 81 MG chewable tablet Chew 81 mg by mouth daily.      . cholecalciferol (VITAMIN D) 400 UNITS TABS Take 400 Units by mouth.      . dexlansoprazole (DEXILANT) 60 MG capsule Take 60 mg by mouth daily.      . finasteride (PROSCAR) 5 MG tablet Take 5 mg by  mouth daily.      . fish oil-omega-3 fatty acids 1000 MG capsule Take 2 g by mouth daily.      . metoprolol tartrate (LOPRESSOR) 25 MG tablet Take 12.5 mg by mouth 2 (two) times daily.      . Multiple Vitamin (MULTIVITAMIN) tablet Take 1 tablet by mouth daily.      . rosuvastatin (CRESTOR) 20 MG tablet Take 20 mg by mouth daily.      [name] ALPRAZolam (XANAX) 0.25 MG tablet Take 1 tablet (0.25 mg total) by mouth at bedtime as needed for anxiety.  30 tablet  0  . eszopiclone (LUNESTA) 2 MG TABS Take 2 mg by mouth at bedtime. Take immediately before bedtime      . lisinopril (PRINIVIL,ZESTRIL) 5 MG tablet Take 1 tablet (5 mg total) by mouth daily.  30 tablet  6   No current facility-administered medications for this visit.    Past Medical History  Diagnosis Date  . Hypertension   . Hyperlipidemia   . GERD (gastroesophageal reflux disease)   . S/P T&A (status post tonsillectomy and adenoidectomy)   . Aorto-iliac disease   . Erectile dysfunction   . Sciatic pain     chronic  . OSA on CPAP   .  Occlusion and stenosis of carotid artery without mention of cerebral infarction     s/p Left CEA  . CAD in native artery 1998    S/p CABG x 4 (LIMA-LAD, SVG-RCA, SVG-OM, SVG-DIAG); SVG-Diag occluded on cath 1999    Past Surgical History  Procedure Laterality Date  . Stents  April 16,2007    Bilateral  iliac a stents  . Appendectomy    . Coronary artery bypass graft  1998    LIMA-LAD, SVG-OM, SVG-RPDA, SVG-DIAG  . Hiatal hernia repair    . Ankle surgery    . Doppler echocardiography  11/20/2010    EF =>55%; LV norm mild aortic scelorosis  . Nm myocar perf wall motion  11/11/2010    STRESS----NORMAL PERFUSION ,EF 68%  . Carotid doppler  03/16/2011  . Arch aortogram and carotid aortogram  06/02/2006    Dr Oneida Alar did surgery  . Iliac arteriography  12/15/2005    PTA and direct stenting rgt and lft common iliac arteries  . Cpet/met  12/2012    Mild chronotropic incompetence; peak VO2 75% (did not  reach Max effort)  . Carotid endarterectomy  Oct. 29, 2007    Left  cea  . Cardiac catheterization  01/30/1998     total native LAD andRCA ,mod native CIRC; svg to RCA and OBTUSE MARG. PATENT the left internal mammary artery graft to LAD ; LV NORMAL  /    HAL:[id number]:OX colds or fevers, no weight changes Skin:no rashes or ulcers HEENT:no blurred vision, no congestion CV:see HPI PUL:see HPI GI:no diarrhea constipation or melena, no indigestion GU:no hematuria, no dysuria MS:no joint pain, no claudication Neuro:no syncope, no lightheadedness Endo:no diabetes, no thyroid disease  PHYSICAL EXAM BP 102/60  Pulse 68  Ht 6' (1.829 m)  Wt 211 lb (95.709 kg)  BMI 28.61 kg/m2 General:Pleasant affect, NAD Skin:Warm and dry, brisk capillary refill HEENT:normocephalic, sclera clear, mucus membranes moist Neck:supple, no JVD, no bruits  Heart:S1S2 RRR without murmur, gallup, rub or click Lungs:clear without rales, rhonchi, or wheezes BDZ:HGDJ, non tender, + BS, do not palpate liver spleen or masses Ext:no lower ext edema, 2+ pedal pulses, 2+ radial pulses Neuro:alert and oriented, MAE, follows commands, + facial symmetry  EKG:SR RBBB-old no acute changes.  ASSESSMENT AND PLAN Hypotension BP has been running 93/60, 80/55 today 102/60,  his PCP has decreased his Lopressor to 12 a half milligrams twice a day she stopped this fluid pill. Patient is without symptoms of dizziness lightheadedness or syncope. For today we will decrease lisinopril to 5 mg daily.  Tachycardia Normally heart rate in the 60s but recently it's been climbing up to 100-105.  He is not aware of this. This may be due to decrease the beta blocker and his low blood pressure. Hopefully with resolution of the hypotension his heart rate will decrease if not would stop lisinopril completely and increase Lopressor back to 25 twice a day.  Insomnia Patient has trouble sleeping he is using his CPAP now but because of that, his  sleep pattern is a little more distorted. His primary did not recommend Lunesta due to memory issues and the melatonin she did recommend is not helping. We discussed higher dose of melatonin. I have given Xanax 0.25 mg to him to try him, when necessary, to sleep with instructions that it can make him sleepy the next day,  also could cause memory issues, but we'll try.  If it does a good job without problems they know they must get it  from their primary care provider not [place] for refills.  OSA on CPAP Has been using for the last 2 weeks  CAD (coronary artery disease) - s/p CABG No chest pain or shortness of breath

## 2013-12-13 DIAGNOSIS — D0460 Carcinoma in situ of skin of unspecified upper limb, including shoulder: Secondary | ICD-10-CM | POA: Diagnosis not present

## 2013-12-13 DIAGNOSIS — L570 Actinic keratosis: Secondary | ICD-10-CM | POA: Diagnosis not present

## 2013-12-13 DIAGNOSIS — D485 Neoplasm of uncertain behavior of skin: Secondary | ICD-10-CM | POA: Diagnosis not present

## 2013-12-13 DIAGNOSIS — Z85828 Personal history of other malignant neoplasm of skin: Secondary | ICD-10-CM | POA: Diagnosis not present

## 2013-12-26 ENCOUNTER — Telehealth: Payer: Self-pay | Admitting: Cardiology

## 2013-12-26 MED ORDER — METOPROLOL TARTRATE 25 MG PO TABS: 25.0000 mg | ORAL_TABLET | Freq: Two times a day (BID) | ORAL | Status: DC

## 2013-12-26 NOTE — Telephone Encounter (Signed)
Increase lopressor to 25 mg twice a day. May take 25 mg now to improve BP.

## 2013-12-26 NOTE — Telephone Encounter (Signed)
Message forwarded to Cecilie Kicks, NP for further instructions.  Pt last seen on 4.10.15.

## 2013-12-26 NOTE — Telephone Encounter (Signed)
Returned call and pt verified x 2 w/ [name], pt's significant other.  Informed message received.  Stated pt's meds were adjusted at last visit b/c BP was low and now it's high.  Advice given per NP and [name] verbalized understanding.  Agreed to call back for changes in BP (high or low) and after hours info given.  Script not needed and will f/u at appt in 2 weeks.

## 2013-12-26 NOTE — Telephone Encounter (Signed)
[name] told him to call if his BP went over 130/80 and it now 165/100 at 7:00 this morning.Please call asap.

## 2014-01-01 ENCOUNTER — Other Ambulatory Visit: Payer: Self-pay | Admitting: Cardiology

## 2014-01-02 DIAGNOSIS — D0460 Carcinoma in situ of skin of unspecified upper limb, including shoulder: Secondary | ICD-10-CM | POA: Diagnosis not present

## 2014-01-02 NOTE — Telephone Encounter (Signed)
Rx refill sent to patient Pharmacy

## 2014-01-06 ENCOUNTER — Ambulatory Visit: Payer: Medicare Other | Admitting: Cardiology

## 2014-01-19 ENCOUNTER — Encounter: Payer: Self-pay | Admitting: Cardiology

## 2014-01-19 ENCOUNTER — Ambulatory Visit (INDEPENDENT_AMBULATORY_CARE_PROVIDER_SITE_OTHER): Payer: Medicare Other | Admitting: Cardiology

## 2014-01-19 VITALS — BP 184/100 | HR 62 | Ht 72.0 in | Wt 217.6 lb

## 2014-01-19 DIAGNOSIS — I10 Essential (primary) hypertension: Secondary | ICD-10-CM | POA: Diagnosis not present

## 2014-01-19 DIAGNOSIS — I959 Hypotension, unspecified: Secondary | ICD-10-CM | POA: Diagnosis not present

## 2014-01-19 DIAGNOSIS — I2510 Atherosclerotic heart disease of native coronary artery without angina pectoris: Secondary | ICD-10-CM | POA: Diagnosis not present

## 2014-01-19 DIAGNOSIS — R0989 Other specified symptoms and signs involving the circulatory and respiratory systems: Secondary | ICD-10-CM

## 2014-01-19 DIAGNOSIS — I739 Peripheral vascular disease, unspecified: Secondary | ICD-10-CM

## 2014-01-19 DIAGNOSIS — E785 Hyperlipidemia, unspecified: Secondary | ICD-10-CM

## 2014-01-19 DIAGNOSIS — I6529 Occlusion and stenosis of unspecified carotid artery: Secondary | ICD-10-CM | POA: Diagnosis not present

## 2014-01-19 DIAGNOSIS — R0609 Other forms of dyspnea: Secondary | ICD-10-CM

## 2014-01-19 DIAGNOSIS — R0600 Dyspnea, unspecified: Secondary | ICD-10-CM

## 2014-01-19 DIAGNOSIS — E663 Overweight: Secondary | ICD-10-CM

## 2014-01-19 MED ORDER — LISINOPRIL 5 MG PO TABS: 5.0000 mg | ORAL_TABLET | Freq: Two times a day (BID) | ORAL | Status: DC

## 2014-01-19 NOTE — Patient Instructions (Signed)
Your physician wants you to follow-up in: 2-3 Months You will receive a reminder letter in the mail two months in advance. If you don't receive a letter, please call our office to schedule the follow-up appointment.  Your physician has recommended you make the following change in your medication: Take Lisinopril 5 mg twice a day, continue to check blood pressure daily your goal blood pressure is 130/70 and 140/80, if Blood Pressure is 160/90 and above take 10 mg tablet at once.  Your physician discussed the importance of regular exercise and recommended that you start or continue a regular exercise program for good health. Try and get your heart rate to 105-115

## 2014-01-22 NOTE — Progress Notes (Signed)
Patient ID: [name], male   DOB: Apr 16, 1934, 80 y.o.   MRN: [id number]  PCP: Margarita Rana, MD  Clinic Note: Chief Complaint  Patient presents with  . 2 months visit    no chest pain , no sob , no edema, has been keeping log of blood pressure   HPI: [name] is a 80 y.o. male with a PMH below who presents today for follow up of his blood pressure management.  I last saw him in Aug of 2014 to followup his CPET that showed chronotropic incompetence so he backed off on beta blocker. He just saw Cecilie Kicks, NP-C on 12/09/2013 in response to concerns over low BP readings. He had been on a combination of BB, ACE-I & chlorthalidone (added for BP & mild diuretic effect). When he saw [name], she noted that his blood pressures and ranging from the 65B to 90X systolic. His PCP stopped chlorthalidone, reduced lisinopril to 10 mg, and reduced metoprolol 12.5 twice a day. [name] noted that his heart rate had increased so she went back on the metoprolol to 25 twice a day and exchanged reduced lisinopril to 5 mg. She also noted that he recently started on CPAP, and was having some difficulty with sleeping/adjusting to having a mask on.  Interval History: Interestingly, despite all that concerned his blood pressure being low, he will doesn't notice it should in the way of dizziness or lightheadedness. His abdomen had any syncope or near syncope, TIA or amaurosis fugax symptoms. He has not noted any reduction in his exercise tolerance. He needs to exercise daily, and is now starting any exercise class in the evenings which is more stretching and range of motion class.  The remainder of cardiac review of systems is as follows: Cardiovascular ROS: no chest pain or dyspnea on exertion negative for - chest pain, edema, irregular heartbeat, loss of consciousness, murmur, orthopnea, palpitations, paroxysmal nocturnal dyspnea, rapid heart rate or shortness of breath, melena or hematochezia, hematuria,  and epistaxis.  Past Medical History  Diagnosis Date  . Hypertension   . Hyperlipidemia   . GERD (gastroesophageal reflux disease)   . S/P T&A (status post tonsillectomy and adenoidectomy)   . Aorto-iliac disease   . Erectile dysfunction   . Sciatic pain     chronic  . OSA on CPAP   . Occlusion and stenosis of carotid artery without mention of cerebral infarction     s/p Left CEA  . CAD in native artery 1998    S/p CABG x 4 (LIMA-LAD, SVG-RCA, SVG-OM, SVG-DIAG); SVG-Diag occluded on cath 1999   Prior Cardiac Evaluation and Past Surgical History: Past Surgical History  Procedure Laterality Date  . Appendectomy    . Coronary artery bypass graft  1998    LIMA-LAD, SVG-OM, SVG-RPDA, SVG-DIAG  . Hiatal hernia repair    . Ankle surgery    . Doppler echocardiography  11/20/2010    EF =>55%; LV norm mild aortic scelorosis  . Nm myocar perf wall motion  11/11/2010    STRESS----NORMAL PERFUSION ,EF 68%  . Carotid doppler  03/16/2011  . Arch aortogram and carotid aortogram  06/02/2006    Dr Oneida Alar did surgery  . Iliac arteriography  12/15/2005    PTA and direct stenting rgt and lft common iliac arteries  . Cpet/met  12/2012    Mild chronotropic incompetence; peak VO2 75% (did not reach Max effort)  . Carotid endarterectomy  Oct. 29, 2007    Left  cea  .  Cardiac catheterization  01/30/1998     total native LAD andRCA ,mod native CIRC; svg to RCA and OBTUSE MARG. PATENT the left internal mammary artery graft to LAD ; LV NORMAL  /  . Carotid doppler  Every 2015    Left CEA patent, R. ICA 60-70% stenosis    Allergies  Allergen Reactions  . Codeine   . Niacin And Related     Current Outpatient Prescriptions  Medication Sig Dispense Refill  . ALPRAZolam (XANAX) 0.25 MG tablet Take 1 tablet (0.25 mg total) by mouth at bedtime as needed for anxiety.  30 tablet  0  . Ashwagandha Extract 2.5 % POWD by Does not apply route.      [name] aspirin 81 MG chewable tablet Chew 81 mg by mouth  daily.      . cholecalciferol (VITAMIN D) 400 UNITS TABS Take 400 Units by mouth.      . dexlansoprazole (DEXILANT) 60 MG capsule Take 60 mg by mouth daily.      . finasteride (PROSCAR) 5 MG tablet Take 5 mg by mouth daily.      . fish oil-omega-3 fatty acids 1000 MG capsule Take 2 g by mouth daily.      [name] lisinopril (PRINIVIL,ZESTRIL) 5 MG tablet Take 1 tablet (5 mg total) by mouth 2 (two) times daily.  60 tablet  6  . metoprolol tartrate (LOPRESSOR) 25 MG tablet TAKE ONE TABLET BY MOUTH TWICE DAILY  60 tablet  6  . rosuvastatin (CRESTOR) 20 MG tablet Take 20 mg by mouth daily.       No current facility-administered medications for this visit.   History   Social History Narrative   He is a widowed father of one, grandfather of 93 with 3 stepchildren. He is now in a long-term relationship, and his significant other is with him for most of his visits. He has been working out vigorously at [employer] the basis. He's up to doing 20 minutes on the stationary bike and 10-15 minutes on the treadmill. He doesn't smoke or drink -- quit smoking in 1998.    ROS: A comprehensive Review of Systems - Negative except Pertinent positives noted above. He does have chronic constipation and mild arthritic pains he has lost about 16 pounds, purposely.  PHYSICAL EXAM BP 184/100  Pulse 62  Ht 6' (1.829 m)  Wt 217 lb 9.6 oz (98.703 kg)  BMI 29.51 kg/m2 -- blood pressure is 160/100 on my recheck General appearance: Alert and oriented X 3, cooperative, appears stated age, no distress and Right healthy-appearing, well-nourished and well-groomed. Noticeably trimmed down. Very pleasant. Answers questions probably. Neck: no adenopathy, no carotid bruit, no JVD and supple, symmetrical, trachea midline Lungs: clear to auscultation bilaterally, normal percussion bilaterally and Nonlabored, good air movement Heart: RRR, S1: normal, S2: physiologically split, no S3 or S4; 1/6 c-d/ Early peaking SEM at 2nd right intercostal  space, radiates to carotids and no rub Abdomen: soft, non-tender; bowel sounds normal; no masses,  no HSM; mild residual truncal obesity Extremities: extremities normal, atraumatic, no cyanosis or edema and no edema,Pulses: 2+ and symmetric Neurologic:  normal strength and tone. Normal symmetric reflexes. Normal coordination and gait  NID:[id number] today: No Recent Labs: Chemistry panel checked, only notable difference his creatinine was 1.25. I don't see his lipid panel.  ASSESSMENT / PLAN:  Hypertension Almost as expected, he goes from being on significant doses of blood pressure medications did almost none. Now his blood pressure is extremely high.  Clearly his pressures are labile, and reacting to either high or low pressures with significant maneuvers is not to work. 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 At the risk of having hypotensive effect of a higher dose of medication, the plan will be to change his lisinopril to 5 mg twice a day.  If his blood pressure is low he will hold the afternoon dose if his blood pressure is high he will take 10 mg in the morning and 5 in the evening. (Low being less than 110, high being greater than 140 mmHg).  Hypotension See above  CAD (coronary artery disease) - s/p CABG No active symptoms. He continues to be very active with exercise. On aspirin, statin, beta blocker and ACE inhibitor.  PAD (peripheral artery disease) - bilateral common iliac stents No active claudication symptoms.  Occlusion and stenosis of carotid artery without mention of cerebral infarction Followed by Dr. Ruta Hinds. Most recent carotid Dopplers from February show moderate stenosis of the right internal carotid with patent left.  Hyperlipidemia On Crestor. Monitored by PCP. Goal LDL should be less than 70.  Overweight (BMI 25.0-29.9) -- Wgt down 16 lb He put back on some  weight from his last visit, but overall has been doing very well. Dietary modifications seem to be working along with the increased exercise.  Dyspnea on exertion - -essentially resolved with BB dose reduction & wgt loss Much less notable. Continue lower dose beta blocker as this is probably related to obesity and chronotropic incompetence   Followup: 2-3 months   Leonie Man, M.D., M.S. Interventional Cardiologist   Pager # [phone] 01/23/2014

## 2014-01-23 ENCOUNTER — Encounter: Payer: Self-pay | Admitting: Cardiology

## 2014-01-23 NOTE — Assessment & Plan Note (Signed)
On Crestor. Monitored by PCP. Goal LDL should be less than 70.

## 2014-01-23 NOTE — Assessment & Plan Note (Signed)
Almost as expected, he goes from being on significant doses of blood pressure medications did almost none. Now his blood pressure is extremely high.  Clearly his pressures are labile, and reacting to either high or low pressures with significant maneuvers is not to work. 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 At the risk of having hypotensive effect of a higher dose of medication, the plan will be to change his lisinopril to 5 mg twice a day.  If his blood pressure is low he will hold the afternoon dose if his blood pressure is high he will take 10 mg in the morning and 5 in the evening. (Low being less than 110, high being greater than 140 mmHg).

## 2014-01-23 NOTE — Assessment & Plan Note (Signed)
No active claudication symptoms. 

## 2014-01-23 NOTE — Assessment & Plan Note (Signed)
He put back on some weight from his last visit, but overall has been doing very well. Dietary modifications seem to be working along with the increased exercise.

## 2014-01-23 NOTE — Assessment & Plan Note (Signed)
Much less notable. Continue lower dose beta blocker as this is probably related to obesity and chronotropic incompetence

## 2014-01-23 NOTE — Assessment & Plan Note (Signed)
No active symptoms. He continues to be very active with exercise. On aspirin, statin, beta blocker and ACE inhibitor.

## 2014-01-23 NOTE — Assessment & Plan Note (Signed)
See above

## 2014-01-23 NOTE — Assessment & Plan Note (Signed)
Followed by Dr. Ruta Hinds. Most recent carotid Dopplers from February show moderate stenosis of the right internal carotid with patent left.

## 2014-02-21 DIAGNOSIS — N401 Enlarged prostate with lower urinary tract symptoms: Secondary | ICD-10-CM | POA: Diagnosis not present

## 2014-02-21 DIAGNOSIS — N400 Enlarged prostate without lower urinary tract symptoms: Secondary | ICD-10-CM | POA: Diagnosis not present

## 2014-02-21 DIAGNOSIS — E291 Testicular hypofunction: Secondary | ICD-10-CM | POA: Diagnosis not present

## 2014-02-21 DIAGNOSIS — Z79899 Other long term (current) drug therapy: Secondary | ICD-10-CM | POA: Diagnosis not present

## 2014-02-21 DIAGNOSIS — N138 Other obstructive and reflux uropathy: Secondary | ICD-10-CM | POA: Diagnosis not present

## 2014-02-21 DIAGNOSIS — N529 Male erectile dysfunction, unspecified: Secondary | ICD-10-CM | POA: Diagnosis not present

## 2014-02-24 DIAGNOSIS — D72829 Elevated white blood cell count, unspecified: Secondary | ICD-10-CM | POA: Diagnosis not present

## 2014-03-02 ENCOUNTER — Other Ambulatory Visit: Payer: Self-pay | Admitting: Cardiology

## 2014-03-02 NOTE — Telephone Encounter (Signed)
Rx refill sent to pharmacy. 

## 2014-03-05 ENCOUNTER — Other Ambulatory Visit: Payer: Self-pay | Admitting: Physician Assistant

## 2014-03-06 ENCOUNTER — Other Ambulatory Visit: Payer: Self-pay | Admitting: Cardiology

## 2014-03-06 NOTE — Telephone Encounter (Signed)
Rx refill sent to patient pharmacy   

## 2014-03-06 NOTE — Telephone Encounter (Signed)
Rx refill denied. It was filled 03/02/14

## 2014-03-07 ENCOUNTER — Other Ambulatory Visit: Payer: Self-pay | Admitting: *Deleted

## 2014-03-07 MED ORDER — METOPROLOL TARTRATE 25 MG PO TABS: ORAL_TABLET | ORAL | Status: DC

## 2014-03-07 MED ORDER — LISINOPRIL 5 MG PO TABS: ORAL_TABLET | ORAL | Status: DC

## 2014-03-07 NOTE — Telephone Encounter (Signed)
Rx was sent to pharmacy electronically. 

## 2014-03-29 ENCOUNTER — Ambulatory Visit (INDEPENDENT_AMBULATORY_CARE_PROVIDER_SITE_OTHER): Payer: Medicare Other | Admitting: Cardiology

## 2014-03-29 ENCOUNTER — Encounter: Payer: Self-pay | Admitting: Cardiology

## 2014-03-29 VITALS — BP 170/102 | HR 71 | Ht 72.0 in | Wt 217.2 lb

## 2014-03-29 DIAGNOSIS — R000 Tachycardia, unspecified: Secondary | ICD-10-CM

## 2014-03-29 DIAGNOSIS — I10 Essential (primary) hypertension: Secondary | ICD-10-CM

## 2014-03-29 DIAGNOSIS — E785 Hyperlipidemia, unspecified: Secondary | ICD-10-CM | POA: Diagnosis not present

## 2014-03-29 DIAGNOSIS — I6529 Occlusion and stenosis of unspecified carotid artery: Secondary | ICD-10-CM | POA: Diagnosis not present

## 2014-03-29 DIAGNOSIS — I739 Peripheral vascular disease, unspecified: Secondary | ICD-10-CM

## 2014-03-29 DIAGNOSIS — I2510 Atherosclerotic heart disease of native coronary artery without angina pectoris: Secondary | ICD-10-CM | POA: Diagnosis not present

## 2014-03-29 DIAGNOSIS — E663 Overweight: Secondary | ICD-10-CM

## 2014-03-29 MED ORDER — ASPIRIN 81 MG PO CHEW: 81.0000 mg | CHEWABLE_TABLET | ORAL | Status: DC

## 2014-03-29 MED ORDER — LISINOPRIL 5 MG PO TABS: ORAL_TABLET | ORAL | Status: DC

## 2014-03-29 NOTE — Progress Notes (Signed)
PCP: MALONEY,NANCY, MD  Clinic Note: Chief Complaint  Patient presents with  . other    2-3 month f/u no complaints. Meds reviewed verbally with pt.   HPI: [name] is a 80 y.o. male with a Cardiovascular Problem List below who presents today for close followup after his recent visit in May. At that time was seeing him for her first visit after seeing Cecilie Kicks, NP-C. she noted that he had a history of some hypotension over the past few months confirmed by the gastroenterologist. At that time his blood pressure medication was reduced - lisinopril 10 mg and metoprolol 12.5 mg twice a day. [name] increased the metoprolol back to 25 twice a day and reduced lisinopril 5 mg a day.  When I saw him his blood pressure was elevated. I agree with keeping the beta blocker at 25 twice a day and change lisinopril to 5 mg twice a day.  Interval History: He now presents really doing well overall from a symptom standpoint. He says he is working out daily doing treadmill and exercise bicycle as well as other weights. He does the treadmill for about 18 minutes and then gets on the bicycle for while. He then goes back to the treadmill in order to assess his heart rate. He says he is able to his heart rate into the 105-110 range. We are doing this he denies any chest tightness or pressure or any significant dyspnea. His dyspnea and he had about a year ago is notably improved. He denies any PND, orthopnea or edema. He is only a bit concerned because he is put on weight in the last 6 months instead of losing it. He has become a little less conscientious about his diet but has picked up his exercise. Remainder of Cardiovascular ROS: No palpitations, lightheadedness, dizziness, weakness or syncope/near syncope. No TIA/amaurosis fugax symptoms. No melena, hematochezia, hematuria, or epstaxis. No claudication.  Past Medical History  Diagnosis Date  . Atherosclerotic heart disease of native coronary artery  without angina pectoris 1998    Multivessel disease on cath for angina: S/p CABG x 4 (LIMA-LAD, SVG-RCA, SVG-OM, SVG-DIAG); SVG-Diag occluded on cath 1999  . 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  . Carotid artery occlusion without infarction 06/2006    a) High Grade L Carotid Stenosis on Doppler -> s/x L CEA; b) Carotid Doppler 10/2013:: Left CEA patent, R. ICA 60-70% stenosis  . 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  . Aortoiliac occlusive disease April 2007    Status post bilateral common iliac artery stenting  . Ischemic heart disease with chronotropic incompetence     Seen on CPET to evaluate Exertional Dyspnea. Only able to achieve 80% of peak heart rate; peak VO2 was 75% - but possibly underestimated.  . Hyperlipidemia with target LDL less than 70   . Essential hypertension   . OSA on CPAP   . Erectile dysfunction due to arterial insufficiency   .  Sciatic pain     Chronic  . GERD (gastroesophageal reflux disease)     Prior Cardiac Evaluation and Past Surgical History: Reviewed in Epic, important details noted in history of present illness Outpatient Encounter Prescriptions as of 03/29/2014  Medication Sig  . ALPRAZolam (XANAX) 0.25 MG tablet Take 1 tablet (0.25 mg total) by mouth at bedtime as needed for anxiety.  . Ashwagandha Extract 2.5 % POWD by Does not apply route.  [name] aspirin 81 MG chewable tablet Chew 1 tablet (81 mg  total) by mouth every other day.  . cholecalciferol (VITAMIN D) 400 UNITS TABS Take 400 Units by mouth.  . dexlansoprazole (DEXILANT) 60 MG capsule Take 60 mg by mouth daily.  . finasteride (PROSCAR) 5 MG tablet Take 5 mg by mouth daily.  . fish oil-omega-3 fatty acids 1000 MG capsule Take 2 g by mouth daily.  . metoprolol tartrate (LOPRESSOR) 25 MG tablet TAKE ONE TABLET BY MOUTH TWICE DAILY  . rosuvastatin (CRESTOR) 20 MG tablet Take 20 mg by mouth daily.  [name] ZETIA 10 MG tablet TAKE ONE TABLET BY MOUTH EVERY DAY  . aspirin 81 MG chewable tablet TAKE 81 mg by mouth daily.  [name] lisinopril (PRINIVIL,ZESTRIL) 5 MG tablet TAKE 1 TABLET (5 MG TOTAL) BY MOUTH 2 (TWO) TIMES DAILY.   Allergies Reviewed in Epic No Change in Social and Family History  ROS: A comprehensive Review of Systems was performed:  Review of Systems  Constitutional: Negative.        Gaining weight - despite exercise.  HENT: Negative.   Eyes: Negative for blurred vision.  Respiratory: Negative.   Cardiovascular: Negative.   Gastrointestinal: Negative for blood in stool and melena.  Genitourinary: Negative for hematuria.  Skin: Positive for itching.  Neurological: Negative for sensory change, speech change, focal weakness, seizures and loss of consciousness.  Endo/Heme/Allergies: Bruises/bleeds easily.  All other systems reviewed and are negative.   Wt Readings from Last 3 Encounters:  03/29/14 217 lb 4 oz (98.544 kg)  01/19/14 217 lb 9.6 oz (98.703 kg)  12/09/13 211 lb (95.709 kg)   PHYSICAL EXAM BP 170/102  Pulse 71  Ht 6' (1.829 m)  Wt 217 lb 4 oz (98.544 kg)  BMI 29.46 kg/m2 General appearance: Alert and oriented X 3, cooperative, appears stated age, no distress and Right healthy-appearing, well-nourished and well-groomed. Noticeably trimmed down. Very pleasant. Answers questions probably.  Neck: no adenopathy, no carotid bruit, no JVD and supple, symmetrical, trachea midline  Lungs: clear to auscultation  bilaterally, normal percussion bilaterally and Nonlabored, good air movement  Heart: RRR, S1: normal, S2: physiologically split, no S3 or S4; 1/6 c-d/ Early peaking SEM at 2nd right intercostal space, radiates to carotids and no rub  Abdomen: soft, non-tender; bowel sounds normal; no masses, no HSM; mild residual truncal obesity  Extremities: extremities normal, atraumatic, no cyanosis or edema and no edema,Pulses: 2+ and symmetric  Neurologic: normal strength and tone. Normal symmetric reflexes. Normal coordination and gait   Adult ECG Report  Rate: 71 ;  Rhythm: normal sinus rhythm and with RBBB; stable  Recent Labs 12/2013: See scanned labs. - Unfortunately, the NMR panel was not scanned.  TC 113, TG 134, HDL 31, LDL 57.  On recheck: Cr. had improved to 1.25 and 1.37. Other labs were relatively normal.   ASSESSMENT / PLAN: Essential hypertension He remains hypertensive today on exam. They recorded his blood pressure over last 2 months, for the most part is high pressures have been in  the morning. He has not had that many pressures as high as they are today. Most the time in the 140-150 range. He definitely has room for more blood pressure control.  I would not increase his beta blocker dose anymore to avoid any further chronotropic incompetence - seems to be able to have his heart rate response to exercise much better. The best plan will be to gradually titrate back his lisinopril --will increase  lisinopril to 10 mg the morning and 5 mg PM.  Atherosclerotic heart disease of native coronary artery without angina pectoris - s/p CABG, occluded SVG-D1; patent LIMA-LAD, SVG-OM, SVG- RCA Doing outstandingly well with his exercise. No active anginal symptoms. Remains on stable dose of beta blocker and statin with increased dose of ACE inhibitor. He is also on aspirin. I think we can back aspirin off to every other day since he is having significant bruising issues.  PAD (peripheral artery  disease) - bilateral common iliac stents No active claudication  Occlusion and stenosis of carotid artery without mention of cerebral infarction Monitored by Dr. Oneida Alar. Stable  Hyperlipidemia with target LDL less than 70 For now we'll continue with Crestor plus Zetia. Probably does not need labs checked for another year. I would like to confirm the results of his NMR panel.  Overweight (BMI 25.0-29.9) -- Wgt back up Weight is actually stable now. I think he may have had some dietary indiscretion but also could be burning that her peroneal muscle baseline about exercise he is doing. The plan is to continue to monitor his diet and continue his exercise. Hopefully this increase will stabilize or be reversed.  Tachycardia Stable with twice a day Lopressor 25   ASA QOD Continue Zetia - labs look Great Still with some BP highs -- Increase ACE-I to 10mg  AM/ 5 mg PM  Orders Placed This Encounter  Procedures  . EKG 12-Lead    Order Specific Question:  Where should this test be performed    Answer:  [facility]   Meds ordered this encounter  Medications  . aspirin 81 MG chewable tablet    Sig: Chew 1 tablet (81 mg total) by mouth every other day.  . lisinopril (PRINIVIL,ZESTRIL) 5 MG tablet    Sig: TAKE 2 TABLETS (10 MG) BY MOUTH IN THE MORNING AND 1 TABLET (5 MG) IN THE AFTERNOON.    Dispense:  180 tablet    Refill:  3    Followup: 6 months   Tyrique Sporn W, M.D., M.S. Interventional Cardiologist   Pager # [phone]

## 2014-03-29 NOTE — Patient Instructions (Signed)
Your physician has recommended you make the following change in your medication:  Take Aspirin 81 mg once every other day  Increase Lisinopril to 10 mg in the am and 5 mg in the afternoon   Your physician wants you to follow-up in: 6 months. You will receive a reminder letter in the mail two months in advance. If you don't receive a letter, please call our office to schedule the follow-up appointment.

## 2014-03-31 ENCOUNTER — Encounter: Payer: Self-pay | Admitting: Cardiology

## 2014-03-31 NOTE — Assessment & Plan Note (Signed)
Weight is actually stable now. I think he may have had some dietary indiscretion but also could be burning that her peroneal muscle baseline about exercise he is doing. The plan is to continue to monitor his diet and continue his exercise. Hopefully this increase will stabilize or be reversed.

## 2014-03-31 NOTE — Assessment & Plan Note (Signed)
He remains hypertensive today on exam. They recorded his blood pressure over last 2 months, for the most part is high pressures have been in the morning. He has not had that many pressures as high as they are today. Most the time in the 140-150 range. He definitely has room for more blood pressure control.  I would not increase his beta blocker dose anymore to avoid any further chronotropic incompetence - seems to be able to have his heart rate response to exercise much better. The best plan will be to gradually titrate back his lisinopril --will increase  lisinopril to 10 mg the morning and 5 mg PM.

## 2014-03-31 NOTE — Assessment & Plan Note (Signed)
Monitored by Dr. Oneida Alar. Stable

## 2014-03-31 NOTE — Assessment & Plan Note (Signed)
No active claudication

## 2014-03-31 NOTE — Assessment & Plan Note (Signed)
Doing outstandingly well with his exercise. No active anginal symptoms. Remains on stable dose of beta blocker and statin with increased dose of ACE inhibitor. He is also on aspirin. I think we can back aspirin off to every other day since he is having significant bruising issues.

## 2014-03-31 NOTE — Assessment & Plan Note (Signed)
For now we'll continue with Crestor plus Zetia. Probably does not need labs checked for another year. I would like to confirm the results of his NMR panel.

## 2014-03-31 NOTE — Assessment & Plan Note (Signed)
Stable with twice a day Lopressor 25

## 2014-04-11 ENCOUNTER — Other Ambulatory Visit: Payer: Self-pay | Admitting: Cardiology

## 2014-04-11 NOTE — Telephone Encounter (Signed)
Rx was sent to pharmacy electronically. 

## 2014-04-17 ENCOUNTER — Ambulatory Visit: Payer: Medicare Other | Admitting: Cardiology

## 2014-05-03 ENCOUNTER — Telehealth: Payer: Self-pay | Admitting: Cardiology

## 2014-05-03 NOTE — Telephone Encounter (Signed)
[name] is calling because he is asking for dates when his Bypass surgery was , when the stents were placed and when he had the corotoid procedures . The [place] [facility] is asking him for this information.. Please call    Thanks

## 2014-05-04 DIAGNOSIS — H6120 Impacted cerumen, unspecified ear: Secondary | ICD-10-CM | POA: Diagnosis not present

## 2014-05-04 DIAGNOSIS — H903 Sensorineural hearing loss, bilateral: Secondary | ICD-10-CM | POA: Diagnosis not present

## 2014-05-04 NOTE — Telephone Encounter (Signed)
INFORMATION GIVEN TO PATIENT VERBALIZED UNDERSTANDING.

## 2014-05-04 NOTE — Telephone Encounter (Signed)
Left message to call back.  Concerning dates of procedures CABG- [facility]- 06/29/06 ILIAC PTA -11/2005

## 2014-05-05 ENCOUNTER — Telehealth: Payer: Self-pay | Admitting: Cardiology

## 2014-05-05 NOTE — Telephone Encounter (Signed)
REturning [employer] .[name] Please call

## 2014-05-05 NOTE — Telephone Encounter (Signed)
Left message to call back  

## 2014-05-05 NOTE — Telephone Encounter (Signed)
Forwarded to  [employer]

## 2014-05-09 DIAGNOSIS — Q828 Other specified congenital malformations of skin: Secondary | ICD-10-CM | POA: Diagnosis not present

## 2014-05-09 DIAGNOSIS — D485 Neoplasm of uncertain behavior of skin: Secondary | ICD-10-CM | POA: Diagnosis not present

## 2014-05-09 DIAGNOSIS — Z85828 Personal history of other malignant neoplasm of skin: Secondary | ICD-10-CM | POA: Diagnosis not present

## 2014-05-09 DIAGNOSIS — D0470 Carcinoma in situ of skin of unspecified lower limb, including hip: Secondary | ICD-10-CM | POA: Diagnosis not present

## 2014-05-09 DIAGNOSIS — L570 Actinic keratosis: Secondary | ICD-10-CM | POA: Diagnosis not present

## 2014-05-09 DIAGNOSIS — B079 Viral wart, unspecified: Secondary | ICD-10-CM | POA: Diagnosis not present

## 2014-05-11 NOTE — Telephone Encounter (Signed)
[name]  Question was answered previously

## 2014-05-29 DIAGNOSIS — Z23 Encounter for immunization: Secondary | ICD-10-CM | POA: Diagnosis not present

## 2014-05-30 DIAGNOSIS — D0470 Carcinoma in situ of skin of unspecified lower limb, including hip: Secondary | ICD-10-CM | POA: Diagnosis not present

## 2014-07-10 DIAGNOSIS — N5201 Erectile dysfunction due to arterial insufficiency: Secondary | ICD-10-CM | POA: Diagnosis not present

## 2014-07-10 DIAGNOSIS — N401 Enlarged prostate with lower urinary tract symptoms: Secondary | ICD-10-CM | POA: Diagnosis not present

## 2014-07-10 DIAGNOSIS — E291 Testicular hypofunction: Secondary | ICD-10-CM | POA: Diagnosis not present

## 2014-07-25 DIAGNOSIS — N401 Enlarged prostate with lower urinary tract symptoms: Secondary | ICD-10-CM | POA: Diagnosis not present

## 2014-07-25 DIAGNOSIS — N138 Other obstructive and reflux uropathy: Secondary | ICD-10-CM | POA: Diagnosis not present

## 2014-07-30 DIAGNOSIS — S61412A Laceration without foreign body of left hand, initial encounter: Secondary | ICD-10-CM | POA: Diagnosis not present

## 2014-08-04 DIAGNOSIS — N401 Enlarged prostate with lower urinary tract symptoms: Secondary | ICD-10-CM | POA: Diagnosis not present

## 2014-08-04 DIAGNOSIS — N138 Other obstructive and reflux uropathy: Secondary | ICD-10-CM | POA: Diagnosis not present

## 2014-09-04 DIAGNOSIS — J069 Acute upper respiratory infection, unspecified: Secondary | ICD-10-CM | POA: Diagnosis not present

## 2014-09-09 DIAGNOSIS — J069 Acute upper respiratory infection, unspecified: Secondary | ICD-10-CM | POA: Diagnosis not present

## 2014-09-12 ENCOUNTER — Telehealth: Payer: Self-pay | Admitting: Cardiology

## 2014-09-12 NOTE — Telephone Encounter (Signed)
Pt c/o BP issue: STAT if pt c/o blurred vision, one-sided weakness or slurred speech  1. What are your last 5 BP readings?   193/157  2. Are you having any other symptoms (ex. Dizziness, headache, blurred vision, passed out)? Stomach disconform, ate bannana and thought it would help.   3. What is your BP issue?   Discomfort in the last week bp around 160/90 Taken one more pill of Lisinopril ever day this week. Most ever day.  Need to know if they should go ER  Pt been sick and got a shot of prednisone.

## 2014-09-12 NOTE — Telephone Encounter (Signed)
Patients wife stated he has had 15 mg of Lisinopril today  Reviewed patients blood pressure reading with Dr. Fletcher Anon (MD in clinic today)   Per Dr. Fletcher Anon stake extra 10 mg of Lisinopril and keep follow up with Dr. Ellyn Hack tomorrow   Advised patient to go to the ED if his situation becomes emergent  Patient verbalized understanding

## 2014-09-12 NOTE — Telephone Encounter (Signed)
Wanted to make sure we got the right number

## 2014-09-13 ENCOUNTER — Encounter: Payer: Self-pay | Admitting: Cardiology

## 2014-09-13 ENCOUNTER — Telehealth: Payer: Self-pay

## 2014-09-13 ENCOUNTER — Ambulatory Visit (INDEPENDENT_AMBULATORY_CARE_PROVIDER_SITE_OTHER): Payer: Medicare Other | Admitting: Cardiology

## 2014-09-13 VITALS — BP 144/100 | HR 76 | Ht 72.0 in | Wt 220.2 lb

## 2014-09-13 DIAGNOSIS — R030 Elevated blood-pressure reading, without diagnosis of hypertension: Secondary | ICD-10-CM

## 2014-09-13 DIAGNOSIS — E785 Hyperlipidemia, unspecified: Secondary | ICD-10-CM | POA: Diagnosis not present

## 2014-09-13 DIAGNOSIS — I952 Hypotension due to drugs: Secondary | ICD-10-CM | POA: Diagnosis not present

## 2014-09-13 DIAGNOSIS — I2510 Atherosclerotic heart disease of native coronary artery without angina pectoris: Secondary | ICD-10-CM

## 2014-09-13 DIAGNOSIS — I10 Essential (primary) hypertension: Secondary | ICD-10-CM | POA: Diagnosis not present

## 2014-09-13 DIAGNOSIS — I739 Peripheral vascular disease, unspecified: Secondary | ICD-10-CM | POA: Diagnosis not present

## 2014-09-13 DIAGNOSIS — IMO0001 Reserved for inherently not codable concepts without codable children: Secondary | ICD-10-CM

## 2014-09-13 DIAGNOSIS — E663 Overweight: Secondary | ICD-10-CM | POA: Diagnosis not present

## 2014-09-13 MED ORDER — LISINOPRIL 10 MG PO TABS: 10.0000 mg | ORAL_TABLET | Freq: Every day | ORAL | Status: DC

## 2014-09-13 MED ORDER — NITROGLYCERIN 0.4 MG SL SUBL: 0.4000 mg | SUBLINGUAL_TABLET | SUBLINGUAL | Status: DC | PRN

## 2014-09-13 MED ORDER — LISINOPRIL 10 MG PO TABS: 10.0000 mg | ORAL_TABLET | Freq: Two times a day (BID) | ORAL | Status: DC

## 2014-09-13 NOTE — Patient Instructions (Addendum)
[place]  Your caregiver has ordered a Stress Test with nuclear imaging. The purpose of this test is to evaluate the blood supply to your heart muscle. This procedure is referred to as a "Non-Invasive Stress Test." This is because other than having an IV started in your vein, nothing is inserted or "invades" your body. Cardiac stress tests are done to find areas of poor blood flow to the heart by determining the extent of coronary artery disease (CAD). Some patients exercise on a treadmill, which naturally increases the blood flow to your heart, while others who are  unable to walk on a treadmill due to physical limitations have a pharmacologic/chemical stress agent called Lexiscan . This medicine will mimic walking on a treadmill by temporarily increasing your coronary blood flow.   Please note: these test may take anywhere between 2-4 hours to complete  PLEASE REPORT TO [facility] AT THE FIRST DESK WILL DIRECT YOU WHERE TO GO  Date of Procedure:_________ 01/20/16____________________________  Arrival Time for Procedure:______0945 am________________________  Instructions regarding medication:    _x___:  Hold betablocker(s) night before procedure and morning of procedure: Metoprolol   PLEASE NOTIFY THE OFFICE AT LEAST 24 HOURS IN ADVANCE IF YOU ARE UNABLE TO KEEP YOUR APPOINTMENT.  [phone] AND  PLEASE NOTIFY NUCLEAR MEDICINE AT [facility] AT LEAST 24 HOURS IN ADVANCE IF YOU ARE UNABLE TO KEEP YOUR APPOINTMENT. [phone]  How to prepare for your Myoview test:  1. Do not eat or drink after midnight 2. No caffeine for 24 hours prior to test 3. No smoking 24 hours prior to test. 4. Your medication may be taken with water.  If your doctor stopped a medication because of this test, do not take that medication. 5. Ladies, please do not wear dresses.  Skirts or pants are appropriate. Please wear a short sleeve shirt. 6. No perfume, cologne or  lotion. 7. Wear comfortable walking shoes. No heels!  Your physician has recommended you make the following change in your medication:  Increase Lisinopril to 10 mg twice daily  Start sublingual Nitrostat 0.4 mg every 5 minutes x 3    If SBP is > 180 and DBP >100 take one sublingual nitro  Recheck in 30 minutes if SBP remains > 180 take additional 5 mg of Lisinopril

## 2014-09-13 NOTE — Assessment & Plan Note (Signed)
No longer an issue. However I am concerned about the potential for hypotension and therefore would aim for target blood pressure remained between 109-323 mmHg for systolic pressures.

## 2014-09-13 NOTE — Assessment & Plan Note (Signed)
Stable, with no symptoms. His last Myoview was in 2012 oh he is not having symptoms, however with the alterations in his blood pressure control along to exclude any involvement of his coronary disease. He is somewhat concerned that there may be some correlation based on previous symptoms.  Plan: Myoview stress test to evaluate for graft patency

## 2014-09-13 NOTE — Assessment & Plan Note (Addendum)
Stable weight. He fluctuates plus or minus a few pounds but is really not been actively trying to lose weight. He is exercising. The patient understands the need to lose weight with diet and exercise. We have discussed specific strategies for this.

## 2014-09-13 NOTE — Assessment & Plan Note (Signed)
No active claudication. He has lower sternum, aortoiliac and carotid disease. Last carotid Dopplers were being checked by Dr. Juanda Crumble fields from vascular surgery.

## 2014-09-13 NOTE — Assessment & Plan Note (Signed)
Still with some BP highs -- Increase ACE-I to 10mg  BID; Refill NTG --> use NTG PRN for SBP > 180 mmHg,   Instructions: If SBP is > 180 and DBP >100 take one sublingual nitro  Recheck in 30 minutes if SBP remains > 180 take additional 5 mg of Lisinopril

## 2014-09-13 NOTE — Assessment & Plan Note (Signed)
On Crestor plus Zetia. Labs have been followed by PCP. I last checked they were well controlled.

## 2014-09-13 NOTE — Progress Notes (Signed)
PCP: Margarita Rana, MD  Clinic Note: Chief Complaint  Patient presents with  . Follow-up    C/o elevated BP. Meds reviewed verbally with pt.  . Coronary Artery Disease   HPI: [name] is a 81 y.o. male with a Cardiovascular Problem List below who presents today for six-month followup of his CAD and cardiac risk factors including hypertension. The main focus for today is that he has had issues with higher blood pressures than usual. He contacted our office recently with blood pressures in the 190 range. Per Dr. Fletcher Anon, he has been taking additional lisinopril doses for blood pressure control. Interestingly, over the our time he was having issues with hypotension and his medications were reduced to 10 mg of lisinopril daily and metoprolol down to 12.5 daily. Socially the metoprolol was increased to 25 and I increased his lisinopril to 5 mg twice a day.  Interval History: he brings with him a list of blood pressure recordings from home that range anywhere from a few readings of low 812X systolic up to as high as 517 systolic. Some diastolic readings as high as the 115 to 120 range.he denies any real symptoms associated with hypertension. No headaches or dizziness or blurred vision. No worsening heart thereor symptoms of M.D., orthopnea or edema. He is essentiallystable from a cardiac standpoint with no active angina or dyspnea with rest or exertion. He is still very active working on the treadmill and exercise bicycle as well as weights. He works out for at least 15-20 minutes on each. He is not having issues getting his heart rate up since the initial reduction in BB dose. As the case, he denies any fatigue or or depressive symptoms.  He denies any PND, orthopnea or edema.  No palpitations, lightheadedness, dizziness, weakness or syncope/near syncope. No TIA/amaurosis fugax symptoms. No melena, hematochezia, hematuria, or epstaxis. No claudication.  Past Medical History  Diagnosis Date    . Atherosclerotic heart disease of native coronary artery without angina pectoris 1998    Multivessel disease on cath for angina: S/p CABG x 4 (LIMA-LAD, SVG-RCA, SVG-OM, SVG-DIAG); SVG-Diag occluded on cath 1999  . 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  . Carotid artery occlusion without infarction 06/2006    a) High Grade L Carotid Stenosis on Doppler -> s/x L CEA; b) Carotid Doppler 10/2013:: Left CEA patent, R. ICA 60-70% stenosis  . 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  . Aortoiliac occlusive disease April 2007    Status post bilateral common iliac artery stenting  . Ischemic heart disease with chronotropic incompetence     Seen on CPET to evaluate Exertional Dyspnea. Only able to achieve 80% of peak heart rate; peak VO2 was 75% - but possibly underestimated.  . Hyperlipidemia with target LDL less than 70   . Essential hypertension   . OSA on CPAP   . Erectile dysfunction due to arterial insufficiency   . Sciatic pain  Chronic  . GERD (gastroesophageal reflux disease)     Allergies  Allergen Reactions  . Codeine   . Niacin And Related    Current Outpatient Prescriptions on File Prior to Visit  Medication Sig Dispense Refill  . ALPRAZolam (XANAX) 0.25 MG tablet Take 1 tablet (0.25 mg total) by mouth at bedtime as needed for anxiety. 30 tablet 0  . Ashwagandha Extract 2.5 % POWD by Does not apply route.    [name] aspirin 81  MG chewable tablet Chew 1 tablet (81 mg total) by mouth every other day.    . cholecalciferol (VITAMIN D) 400 UNITS TABS Take 400 Units by mouth.    . dexlansoprazole (DEXILANT) 60 MG capsule Take 60 mg by mouth daily.    . finasteride (PROSCAR) 5 MG tablet Take 5 mg by mouth daily.    . fish oil-omega-3 fatty acids 1000 MG capsule Take 2 g by mouth daily.    . metoprolol tartrate (LOPRESSOR) 25 MG tablet TAKE ONE TABLET BY MOUTH TWICE DAILY 180 tablet 3  . rosuvastatin (CRESTOR) 20 MG tablet Take 20 mg by mouth daily.    [name] ZETIA 10 MG tablet TAKE ONE TABLET BY MOUTH EVERY DAY 30 tablet 11   No current facility-administered medications on file prior to visit.   No Change in Social and Family History  ROS: A comprehensive Review of Systems was performed:  Review of Systems  Constitutional: Negative for weight loss and malaise/fatigue.  HENT: Negative for hearing loss.   Eyes: Negative for blurred vision and double vision.  Respiratory: Negative for shortness of breath.   Cardiovascular: Negative for claudication.  Gastrointestinal: Negative for blood in stool and melena.  Genitourinary: Negative for hematuria and flank pain.  Neurological: Positive for weakness. Negative for dizziness and headaches.  Endo/Heme/Allergies: Does not bruise/bleed easily.  Psychiatric/Behavioral: Negative for depression and memory loss. The patient does not have insomnia.   All other systems reviewed and are negative.   Wt Readings from Last 3 Encounters:  09/13/14 220 lb 4 oz (99.905 kg)  03/29/14 217 lb 4 oz (98.544 kg)  01/19/14 217 lb 9.6 oz (98.703 kg)   PHYSICAL EXAM BP 144/100 mmHg  Pulse 76  Ht 6' (1.829 m)  Wt 220 lb 4 oz (99.905 kg)  BMI 29.86 kg/m2 General appearance: A&O X 3, cooperative, appears stated age, no distress and Right healthy-appearing, well-nourished and well-groomed. Noticeably trimmed down. Very pleasant. Answers questions probably.  Neck: no adenopathy, no carotid bruit,  no JVD and supple, symmetrical, trachea midline  Lungs: CTAB, normal percussion bilaterally and Nonlabored, good air movement  Heart: RRR, S1: normal, S2: physiologically split, no S3 or S4; 1/6 c-d/ Early peaking SEM at 2nd right IS-->to carotids and no rub  Abdomen: soft, non-tender; bowel sounds normal; no masses, no HSM; mild residual truncal obesity  Extremities: extremities normal, atraumatic, no cyanosis or edema and no edema,Pulses: 2+ and symmetric  Neurologic: normal strength and tone. Normal symmetric reflexes. Normal coordination and gait   Adult ECG Report  Rate: 76;  Rhythm: normal sinus rhythm and with RBBB; stable   Recent Labs 12/2013: Recently check by PCP - not available   ASSESSMENT / PLAN: Essential hypertension Still with some BP highs -- Increase ACE-I to 10mg  BID; Refill NTG --> use NTG PRN for SBP > 180 mmHg,   Instructions: If SBP is > 180 and DBP >100 take one sublingual nitro  Recheck in 30 minutes if SBP remains > 180 take additional 5  mg of Lisinopril    Atherosclerotic heart disease of native coronary artery without angina pectoris - s/p CABG, occluded SVG-D1; patent LIMA-LAD, SVG-OM, SVG- RCA Stable, with no symptoms. His last Myoview was in 2012 oh he is not having symptoms, however with the alterations in his blood pressure control along to exclude any involvement of his coronary disease. He is somewhat concerned that there may be some correlation based on previous symptoms.  Plan: Myoview stress test to evaluate for graft patency   Hyperlipidemia with target LDL less than 70 On Crestor plus Zetia. Labs have been followed by PCP. I last checked they were well controlled.   Hypotension No longer an issue. However I am concerned about the potential for hypotension and therefore would aim for target blood pressure remained between 902-409 mmHg for systolic pressures.   Overweight (BMI 25.0-29.9) -- Wgt back up Stable weight. He fluctuates plus or  minus a few pounds but is really not been actively trying to lose weight. He is exercising. The patient understands the need to lose weight with diet and exercise. We have discussed specific strategies for this.   PAD (peripheral artery disease) - bilateral common iliac stents No active claudication. He has lower sternum, aortoiliac and carotid disease. Last carotid Dopplers were being checked by Dr. Juanda Crumble fields from vascular surgery.    Orders Placed This Encounter  Procedures  . Myocardial Perfusion Imaging    Standing Status: Future     Number of Occurrences:      Standing Expiration Date: 09/13/2015    Scheduling Instructions:     To be performed at [facility]    Order Specific Question:  Where should this test be performed    Answer:  Other    Order Specific Question:  Type of stress    Answer:  Exercise    Order Specific Question:  Patient weight in lbs    Answer:  220  . EKG 12-Lead    Order Specific Question:  Where should this test be performed    Answer:  [facility]   Meds ordered this encounter  Medications  . Probiotic Product (PROBIOTIC DAILY PO)    Sig: Take by mouth daily.  [name] DISCONTD: lisinopril (PRINIVIL,ZESTRIL) 10 MG tablet    Sig: Take 1 tablet (10 mg total) by mouth daily.    Dispense:  90 tablet    Refill:  3  . DISCONTD: nitroGLYCERIN (NITROSTAT) 0.4 MG SL tablet    Sig: Place 1 tablet (0.4 mg total) under the tongue every 5 (five) minutes as needed for chest pain.    Dispense:  90 tablet    Refill:  3  . nitroGLYCERIN (NITROSTAT) 0.4 MG SL tablet    Sig: Place 1 tablet (0.4 mg total) under the tongue every 5 (five) minutes as needed for chest pain.    Dispense:  25 tablet    Refill:  3  . lisinopril (PRINIVIL,ZESTRIL) 10 MG tablet    Sig: Take 1 tablet (10 mg total) by mouth 2 (two) times daily.    Dispense:  90 tablet    Refill:  3    Followup: 3 months   [name], Leonie Green, M.D., M.S. Interventional Cardiologist   Pager # [phone]

## 2014-09-13 NOTE — Telephone Encounter (Signed)
Pt friend called, states pt needs to r/s his stress to to 1/27 or 01/29. Please call to r/s.

## 2014-09-13 NOTE — Telephone Encounter (Signed)
ok 

## 2014-09-14 NOTE — Telephone Encounter (Signed)
Informed patient stress [name] has been rescheduled for 09/27/14 at 0800 Arrive at [facility]  Patient verbalized understanding

## 2014-09-14 NOTE — Telephone Encounter (Signed)
This encounter was created in error - please disregard.

## 2014-09-27 ENCOUNTER — Inpatient Hospital Stay: Payer: Self-pay | Admitting: Internal Medicine

## 2014-09-27 DIAGNOSIS — Z87891 Personal history of nicotine dependence: Secondary | ICD-10-CM | POA: Diagnosis not present

## 2014-09-27 DIAGNOSIS — I2510 Atherosclerotic heart disease of native coronary artery without angina pectoris: Secondary | ICD-10-CM | POA: Diagnosis not present

## 2014-09-27 DIAGNOSIS — K219 Gastro-esophageal reflux disease without esophagitis: Secondary | ICD-10-CM | POA: Diagnosis present

## 2014-09-27 DIAGNOSIS — G4733 Obstructive sleep apnea (adult) (pediatric): Secondary | ICD-10-CM | POA: Diagnosis present

## 2014-09-27 DIAGNOSIS — E784 Other hyperlipidemia: Secondary | ICD-10-CM | POA: Diagnosis not present

## 2014-09-27 DIAGNOSIS — I6529 Occlusion and stenosis of unspecified carotid artery: Secondary | ICD-10-CM | POA: Diagnosis present

## 2014-09-27 DIAGNOSIS — I480 Paroxysmal atrial fibrillation: Secondary | ICD-10-CM | POA: Diagnosis not present

## 2014-09-27 DIAGNOSIS — I10 Essential (primary) hypertension: Secondary | ICD-10-CM | POA: Diagnosis not present

## 2014-09-27 DIAGNOSIS — Z7982 Long term (current) use of aspirin: Secondary | ICD-10-CM | POA: Diagnosis not present

## 2014-09-27 DIAGNOSIS — I4891 Unspecified atrial fibrillation: Secondary | ICD-10-CM | POA: Diagnosis not present

## 2014-09-27 DIAGNOSIS — Z955 Presence of coronary angioplasty implant and graft: Secondary | ICD-10-CM | POA: Diagnosis not present

## 2014-09-27 DIAGNOSIS — Z951 Presence of aortocoronary bypass graft: Secondary | ICD-10-CM | POA: Diagnosis not present

## 2014-09-27 DIAGNOSIS — E785 Hyperlipidemia, unspecified: Secondary | ICD-10-CM | POA: Diagnosis not present

## 2014-09-27 DIAGNOSIS — N400 Enlarged prostate without lower urinary tract symptoms: Secondary | ICD-10-CM | POA: Diagnosis present

## 2014-09-27 LAB — COMPREHENSIVE METABOLIC PANEL
Albumin: 3.2 g/dL — ABNORMAL LOW (ref 3.4–5.0)
Alkaline Phosphatase: 70 U/L (ref 46–116)
Anion Gap: 10 (ref 7–16)
BUN: 16 mg/dL (ref 7–18)
Bilirubin,Total: 1.7 mg/dL — ABNORMAL HIGH (ref 0.2–1.0)
Calcium, Total: 8.9 mg/dL (ref 8.5–10.1)
Chloride: 107 mmol/L (ref 98–107)
Co2: 23 mmol/L (ref 21–32)
Creatinine: 1.17 mg/dL (ref 0.60–1.30)
EGFR (African American): >60
EGFR (Non-African Amer.): >60
Glucose: 102 mg/dL — ABNORMAL HIGH (ref 65–99)
Osmolality: 281 (ref 275–301)
Potassium: 3.9 mmol/L (ref 3.5–5.1)
SGOT(AST): 27 U/L (ref 15–37)
SGPT (ALT): 28 U/L (ref 14–63)
Sodium: 140 mmol/L (ref 136–145)
Total Protein: 6.7 g/dL (ref 6.4–8.2)

## 2014-09-27 LAB — TROPONIN I
Troponin-I: 0.04 ng/mL
Troponin-I: 0.04 ng/mL
Troponin-I: 0.05 ng/mL

## 2014-09-27 LAB — CK TOTAL AND CKMB (NOT AT ARMC)
CK, Total: 98 U/L (ref 39–308)
CK, Total: 95 U/L (ref 39–308)
CK, Total: 108 U/L (ref 39–308)
CK-MB: 3.7 ng/mL — ABNORMAL HIGH (ref 0.5–3.6)
CK-MB: 3.6 ng/mL (ref 0.5–3.6)
CK-MB: 3.9 ng/mL — ABNORMAL HIGH (ref 0.5–3.6)

## 2014-09-27 LAB — CBC WITH DIFFERENTIAL/PLATELET
Basophil #: 0.1 10*3/uL (ref 0.0–0.1)
Basophil %: 0.7 %
Eosinophil #: 0 10*3/uL (ref 0.0–0.7)
Eosinophil %: 0.1 %
HCT: 46.2 % (ref 40.0–52.0)
HGB: 15.4 g/dL (ref 13.0–18.0)
Lymphocyte #: 1.2 10*3/uL (ref 1.0–3.6)
Lymphocyte %: 11.5 %
MCH: 31.4 pg (ref 26.0–34.0)
MCHC: 33.3 g/dL (ref 32.0–36.0)
MCV: 94 fL (ref 80–100)
Monocyte #: 1.2 x10 3/mm — ABNORMAL HIGH (ref 0.2–1.0)
Monocyte %: 11.2 %
Neutrophil #: 7.9 10*3/uL — ABNORMAL HIGH (ref 1.4–6.5)
Neutrophil %: 76.5 %
Platelet: 150 10*3/uL (ref 150–440)
RBC: 4.9 10*6/uL (ref 4.40–5.90)
RDW: 13.6 % (ref 11.5–14.5)
WBC: 10.3 10*3/uL (ref 3.8–10.6)

## 2014-09-27 LAB — HEPARIN LEVEL (UNFRACTIONATED): Anti-Xa(Unfractionated): 0.85 IU/mL — ABNORMAL HIGH (ref 0.30–0.70)

## 2014-09-27 LAB — TSH: Thyroid Stimulating Horm: 1.06 u[IU]/mL

## 2014-09-27 LAB — APTT: Activated PTT: 28.1 secs (ref 23.6–35.9)

## 2014-09-27 LAB — MAGNESIUM: Magnesium: 1.7 mg/dL — ABNORMAL LOW

## 2014-09-27 LAB — PROTIME-INR
INR: 1.1
Prothrombin Time: 13.9 secs (ref 11.5–14.7)

## 2014-09-28 ENCOUNTER — Telehealth: Payer: Self-pay

## 2014-09-28 LAB — CBC WITH DIFFERENTIAL/PLATELET
Basophil #: 0.1 10*3/uL (ref 0.0–0.1)
Basophil %: 0.8 %
Eosinophil #: 0.1 10*3/uL (ref 0.0–0.7)
Eosinophil %: 0.8 %
HCT: 44.7 % (ref 40.0–52.0)
HGB: 14.9 g/dL (ref 13.0–18.0)
Lymphocyte #: 2.3 10*3/uL (ref 1.0–3.6)
Lymphocyte %: 20.4 %
MCH: 31.5 pg (ref 26.0–34.0)
MCHC: 33.3 g/dL (ref 32.0–36.0)
MCV: 95 fL (ref 80–100)
Monocyte #: 1.3 x10 3/mm — ABNORMAL HIGH (ref 0.2–1.0)
Monocyte %: 10.9 %
Neutrophil #: 7.7 10*3/uL — ABNORMAL HIGH (ref 1.4–6.5)
Neutrophil %: 67.1 %
Platelet: 141 10*3/uL — ABNORMAL LOW (ref 150–440)
RBC: 4.73 10*6/uL (ref 4.40–5.90)
RDW: 14 % (ref 11.5–14.5)
WBC: 11.5 10*3/uL — ABNORMAL HIGH (ref 3.8–10.6)

## 2014-09-28 LAB — BASIC METABOLIC PANEL
Anion Gap: 6 — ABNORMAL LOW (ref 7–16)
BUN: 20 mg/dL — ABNORMAL HIGH (ref 7–18)
Calcium, Total: 8.9 mg/dL (ref 8.5–10.1)
Chloride: 110 mmol/L — ABNORMAL HIGH (ref 98–107)
Co2: 26 mmol/L (ref 21–32)
Creatinine: 1.19 mg/dL (ref 0.60–1.30)
EGFR (African American): >60
EGFR (Non-African Amer.): >60
Glucose: 100 mg/dL — ABNORMAL HIGH (ref 65–99)
Osmolality: 286 (ref 275–301)
Potassium: 3.9 mmol/L (ref 3.5–5.1)
Sodium: 142 mmol/L (ref 136–145)

## 2014-09-28 LAB — HEPARIN LEVEL (UNFRACTIONATED): Anti-Xa(Unfractionated): 0.74 IU/mL — ABNORMAL HIGH (ref 0.30–0.70)

## 2014-09-28 NOTE — Telephone Encounter (Signed)
Attempted to contact pt regarding discharge from [facility] on 09/28/14/  Left message for pt to call back w/ any questions or concerns about medications and/or discharge instructions. Advised him of appt w/ Christell Faith, PA on 10/03/14 @ 1:15. Asked him to call back if unable to keep this appt.

## 2014-09-29 ENCOUNTER — Telehealth: Payer: Self-pay

## 2014-09-29 NOTE — Telephone Encounter (Signed)
Spoke w/ pt's girlfriend, [name].   Advised her that I had spoken w/ Thurmond Butts who recommends an IV fluid bolus. Advised her to call EMS, have them come out to administer this and to obtain a rhythm strip and fax to our office.  She verbalizes understanding, though she states that she left to run an errand and will be back home to check on pt soon.  Asked her to have EMT call me w/ any questions.

## 2014-09-29 NOTE — Telephone Encounter (Signed)
Reviewed all documentation and was in contact with Earl Lagos, RN throughout all of the above.

## 2014-09-29 NOTE — Telephone Encounter (Signed)
Spoke w/ EMT.  She reports that they performed orthostatic vitals on pt and all were WNL.  Reports systolic BP remained 161-096 and HR remained in the 70s.  Reports that on exam, pt's BP monitor is not working correctly. Reports pt refused further treatment or transport.  Asked them to advise pt to obtain a new monitor and call back w/ any further questions or concerns.

## 2014-09-29 NOTE — Telephone Encounter (Signed)
Spoke w/ pt's wife.  She reports that pt was d/c'd from [facility] on 09/28/14, pt was started on diltiazem ER 120 mg daily. Reports BP this am 87/49.  She gave pt diltiazem and metoprolol 25 mg, and BP is now 78/41. Advised her that I will make Christell Faith, PA aware and call her back w/ his recommendations. Advised her in the meantime, to push fluids, have pt remain lying down, and to assist him w/ changing positions slowly.  She reports that pt is lying down sleeping.

## 2014-09-29 NOTE — Telephone Encounter (Signed)
BP currently is 78/41, at 9 am, it was 87/49, before medication. HR 76

## 2014-10-02 DIAGNOSIS — I480 Paroxysmal atrial fibrillation: Secondary | ICD-10-CM

## 2014-10-02 HISTORY — PX: TRANSTHORACIC ECHOCARDIOGRAM: SHX275

## 2014-10-02 HISTORY — DX: Paroxysmal atrial fibrillation: I48.0

## 2014-10-03 ENCOUNTER — Ambulatory Visit (INDEPENDENT_AMBULATORY_CARE_PROVIDER_SITE_OTHER): Payer: Medicare Other | Admitting: Physician Assistant

## 2014-10-03 ENCOUNTER — Encounter: Payer: Self-pay | Admitting: Physician Assistant

## 2014-10-03 VITALS — BP 110/60 | HR 63 | Ht 72.0 in | Wt 219.5 lb

## 2014-10-03 DIAGNOSIS — I6529 Occlusion and stenosis of unspecified carotid artery: Secondary | ICD-10-CM

## 2014-10-03 DIAGNOSIS — I4891 Unspecified atrial fibrillation: Secondary | ICD-10-CM | POA: Diagnosis not present

## 2014-10-03 DIAGNOSIS — I10 Essential (primary) hypertension: Secondary | ICD-10-CM | POA: Diagnosis not present

## 2014-10-03 DIAGNOSIS — E785 Hyperlipidemia, unspecified: Secondary | ICD-10-CM | POA: Diagnosis not present

## 2014-10-03 DIAGNOSIS — I480 Paroxysmal atrial fibrillation: Secondary | ICD-10-CM

## 2014-10-03 DIAGNOSIS — G4733 Obstructive sleep apnea (adult) (pediatric): Secondary | ICD-10-CM

## 2014-10-03 DIAGNOSIS — Z9989 Dependence on other enabling machines and devices: Secondary | ICD-10-CM

## 2014-10-03 DIAGNOSIS — I2510 Atherosclerotic heart disease of native coronary artery without angina pectoris: Secondary | ICD-10-CM | POA: Diagnosis not present

## 2014-10-03 DIAGNOSIS — R030 Elevated blood-pressure reading, without diagnosis of hypertension: Secondary | ICD-10-CM

## 2014-10-03 DIAGNOSIS — R0989 Other specified symptoms and signs involving the circulatory and respiratory systems: Secondary | ICD-10-CM

## 2014-10-03 DIAGNOSIS — I952 Hypotension due to drugs: Secondary | ICD-10-CM

## 2014-10-03 NOTE — Patient Instructions (Addendum)
We will draw labs today:  BMET, Magnesium  Please stop lisinopril Please start Lopressor 25 mg twice daily  Please follow up w/ Dr. Roselind Messier as scheduled  [place] caregiver has ordered a Stress Test with nuclear imaging. The purpose of this test is to evaluate the blood supply to your heart muscle. This procedure is referred to as a "Non-Invasive Stress Test." This is because other than having an IV started in your vein, nothing is inserted or "invades" your body. Cardiac stress tests are done to find areas of poor blood flow to the heart by determining the extent of coronary artery disease (CAD). Some patients exercise on a treadmill, which naturally increases the blood flow to your heart, while others who are  unable to walk on a treadmill due to physical limitations have a pharmacologic/chemical stress agent called Lexiscan . This medicine will mimic walking on a treadmill by temporarily increasing your coronary blood flow.   Please note: these test may take anywhere between 2-4 hours to complete  PLEASE REPORT TO [facility] AT THE FIRST DESK WILL DIRECT YOU WHERE TO GO  Date of Procedure:_____Wednesday, Feb 24_________  Arrival Time for Procedure:_____7:15 am_____________  Instructions regarding medication:   __X__:  Hold LOPRESSOR the night before procedure and morning of procedure  PLEASE NOTIFY THE OFFICE AT LEAST 24 HOURS IN ADVANCE IF YOU ARE UNABLE TO KEEP YOUR APPOINTMENT.  [phone] AND  PLEASE NOTIFY NUCLEAR MEDICINE AT [facility] AT LEAST 24 HOURS IN ADVANCE IF YOU ARE UNABLE TO KEEP YOUR APPOINTMENT. [phone]  How to prepare for your Myoview test:  1. Do not eat or drink after midnight 2. No caffeine for 24 hours prior to test 3. No smoking 24 hours prior to test. 4. Your medication may be taken with water.  If your doctor stopped a medication because of this test, do not take that medication. 5. Ladies, please do not wear  dresses.  Skirts or pants are appropriate. Please wear a short sleeve shirt. 6. No perfume, cologne or lotion.

## 2014-10-03 NOTE — Progress Notes (Signed)
  Patient Name: [name], DOB 04/29/1934, MRN [id number]  Date of Encounter: 10/04/2014  Primary Care Provider:  MALONEY, NANCY, MD Primary Cardiologist:  Dr. Harding, MD  Chief Complaint  Patient presents with  . Other    Follow up from [facility]. Meds reviewed by the patient verbally. Pt. c/o weakness and dizziness.     HPI:  80 year old male with history of CAD s/p 4 vessel CABG in 1999, carotid artery occlusion s/p L CEA, ischemic heart disease with chronotropic incompetence, HLD, HTN, OSA on CPAP, aortoiliac occlusive disease s/p common iliac artery stenting 2007, and GERD who presents for hospital follow up after a recent admission to [facility] from 1/27-1/28 for new onset a-fib with RVR with HR in the 170s as of 09/27/2014 (now in NSR).   He has known CAD s/p 4 vessel CABG in 1999 (LIMA-LAD, SVG-RCA, SVG-OM, SVG-DIAG). Last cardiac cath showed occluded SVG-D1; ostial Left Main 80-90%, diffuse LAD disease w/ D1 &2 subtotalled, LCx 70% proximal with competitive flow from SVG-OM, RCA 100% occluded; patent LIMA-LAD, LC-RCA and SVG-OM. He underwent Myoview in 10/2010: Ex 6.5 min - 9 METS, HR to 149, No Ischemia or infarct. Echo 10/2010: showed EF >55%, mild Aortic Sclerosis, Concentric Remodelling. Recently, he has been having labile blood pressures with systolic readings of low 100s to 190 and diastolic readings as high as 115-120. He has been asymptomatic with these readings. No headaches or dizziness or blurred vision. No anginal symptoms, orthopnea or edema. He has been essentially stable from a cardiac standpoint with no dyspnea with rest or exertion. He is still very active working on the treadmill and exercise bicycle as well as weights. He works out for at least 15-20 minutes on each. His lisinopril was increased to 5 mg bid. Lopressor was continued at 25 mg bid. He was scheduled for a Myoview on 1/27 based on his last OV on 1/14 secondary to his labile blood pressures. His primary  cardiologist wanted to r/o any involvement of his coronary disease as the patient was somewhat concerned that there may be some correlation based on previous symptoms.   The patient showed up in nuclear medicine on 1/27 for his stress test after holding his Lopressor the prior night and the morning of the stress test. He was feeling like his usual self. No SOB, chest pain, edema, palpitations, diaphoresis, presyncope, or syncope. He had his reseting images without issue. Upon being hooked up to the monitor he was found to be in new onset a-fib with RVR with heart rate in the 170s. He was completely asymptomatic. Blood pressure was stable in the 150s/60s. He denied all symptoms including chest pain, palpitations, SOB, nausea, vomiting, diaphoresis, edema, presyncope, or syncope. I administered IV diltiazem 10 mg at 9:37 AM with improvement in HR to the 1-teens to 120s. His blood pressure remained stable in the 130s/50s. Again, he remained asymptomatic. Orders were palced for direct admission through the hospitalist service. I gave another IV diltiazem 10 mg at 9:50 AM with improvement in heart rate to the low 100s to upper 90s. He remained in a-fib for a brief while then converted to NSR during his echocardiogram. He remained asymptomatic. Upon questioning him further he reported having had a recent URI that prevented him from wearing his CPAP lately. He also falls asleep easily when sitting on the couch. Patient's girlfriend reported his pulse has been slightly higher than normal lately, running anywhere from the 70s to 90s and low 100s.     Echo showed normal LV function, mildly dilated left atrium at 4.2 cm, normal RV size and systolic function, normal RVSP. He was placed on diltiazem 120 mg daily, continued on Lopressor 25 mg bid, and started on Eliquis 5 mg bid given a CHADSVASc of 4.   He did call the office on 1/29 stating his blood pressure was low measuring 87/49. He then took the above medications,  which apparently lowered his blood pressure further to 78/41. He was laying down sleeping. The patient's girlfriend had left to go run some errands. We advised her to come back to the house. EMS arrived and checked his blood pressure which remained 116-120 and HR remained in the 70s. Orthostatic vital signs were all normal for him as well. His blood pressure cuff was checked against theirs and it was determined his was faulty. He was feeling well. He refused treatment or transport.   He comes in today stating he feels well. He has had some intermittent dizziness since his hospital discharge however this has been resolved by taking his Lopressor 1 hour later than the rest of his AM medications. It was discovered upon reviewing his medications with him that he was only taking the Lopressor one time daily. Also, upon reviewing his BP and pulse log it was discovered each morning he was tachycardic with HR in the 1-teens to low 100s. Upon taking his AM medications his HR would improve to the 70s-80s. Stable blood pressure. He would like to restart his work out routine. Previously worked out almost daily for at least 20 minutes. No chest pain, palpitations, SOB, diaphoresis, presyncope, or syncope. He is tolerating his medications without issues.       Past Medical History  Diagnosis Date  . Atherosclerotic heart disease of native coronary artery without angina pectoris 1998    Multivessel disease on cath for angina: S/p CABG x 4 (LIMA-LAD, SVG-RCA, SVG-OM, SVG-DIAG); SVG-Diag occluded on cath 1999  . Atherosclerotic heart disease of artery bypass graft 1999    a) Cardiac Cath : Occluded SVG-D1;; ostial Left Main 80-90%, diffuse LAD disease w/ D1 &2 subtotalled, LCx 70% proximal with competitive flow from SVG-OM, RCA 100% occluded; patent LIMA-LAD, LC-RCA and SVG-OM; b) Myoview 10/2010: Ex 6.5 min - 9 METS, HR to 149, No Ischemia or infarct, PVCs; c) Echo 10/2010: EF >55%, mild Aortic Sclerosis, Concentric  Remodelling.  . Carotid artery occlusion without infarction 06/2006    a) High Grade L Carotid Stenosis on Doppler -> s/x L CEA; b) Carotid Doppler 10/2013:: Left CEA patent, R. ICA 60-70% stenosis  . 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  . Aortoiliac occlusive disease April 2007    Status post bilateral common iliac artery stenting  . Ischemic heart disease with chronotropic incompetence     Seen on CPET to evaluate Exertional Dyspnea. Only able to achieve 80% of peak heart rate; peak VO2 was 75% - but possibly underestimated.  . Hyperlipidemia with target LDL less than 70   . Essential hypertension   . OSA on CPAP   . Erectile dysfunction due to arterial insufficiency   . Sciatic pain     Chronic  . GERD (gastroesophageal reflux disease)   : Past Surgical History  Procedure Laterality Date  . Coronary artery bypass graft  1998    LIMA-LAD, SVG-OM, SVG-RPDA,  SVG-DIAG  . Cardiac catheterization  01/30/1998      total native LAD andRCA ,mod native CIRC; svg to RCA and OBTUSE MARG. PATENT the left internal mammary artery graft to LAD ; LV NORMAL  /  . Doppler echocardiography  11/20/2010    EF =>55%; LV norm mild aortic scelorosis  . Nm myocar perf wall motion  11/11/2010    STRESS----NORMAL PERFUSION ,EF 68%  . Cpet / met  12/2012    Mild chronotropic incompetence - read 82% of predicted; also reduced effort; peak VO2 15.7 / 75% (did not reach Max effort) -- suggested ischemic response in last 1.5 minutes of exercise. Normal pulmonary function on PFTs but poor response to  . Arch aortogram and carotid aortogram  06/02/2006    Dr Fields did surgery  . Iliac artery stent  12/15/2005    PTA and direct stenting rgt and lft common iliac arteries  . Thoracic aorta - carotid angiogram  October 2007    Dr.  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 Carotid endarterectomy Left Oct. 29, 2007    Dr. Fields:  . Hiatal hernia repair    . Ankle surgery    . Appendectomy    : Family History  Problem Relation Age of Onset  . Ulcers Mother     Peptic  . Dementia Mother   . Alcohol abuse Father   :  reports that he quit smoking about 17 years ago. His smoking use included Cigarettes. He has never used smokeless tobacco. He reports that he does not drink alcohol or use illicit drugs.:   Allergies:  Allergies  Allergen Reactions  . Codeine   . Niacin And Related      Home Medications:  Current Outpatient Prescriptions  Medication Sig Dispense Refill  . apixaban (ELIQUIS) 5 MG TABS tablet Take 5 mg by mouth 2 (two) times daily.    . dexlansoprazole (DEXILANT) 60 MG capsule Take 60 mg by mouth daily.    . diltiazem (CARDIZEM CD) 120 MG 24 hr capsule Take 120 mg by mouth daily.    . finasteride (PROSCAR) 5 MG tablet Take 5 mg by mouth daily.    . fish oil-omega-3 fatty acids 1000 MG capsule Take 2 g by mouth daily.    . lisinopril (PRINIVIL,ZESTRIL) 10 MG tablet Take 10 mg by mouth daily.    . magnesium oxide (MAG-OX) 400 MG tablet Take 400 mg by mouth daily.    . metoprolol tartrate (LOPRESSOR) 25 MG tablet Take 25 mg by mouth daily.    . nitroGLYCERIN (NITROSTAT) 0.4 MG SL tablet Place 1 tablet (0.4 mg total) under the tongue every 5 (five) minutes as needed for chest pain. 25 tablet 3  . rosuvastatin (CRESTOR) 20 MG tablet Take 20 mg by mouth daily.    . ZETIA 10 MG tablet TAKE ONE TABLET BY MOUTH EVERY DAY 30 tablet 11   No current facility-administered medications for this visit.    Weights: Wt Readings from Last 3 Encounters:  10/03/14 219 lb 8 oz (99.565 kg)  09/13/14 220 lb 4 oz (99.905 kg)  03/29/14 217 lb 4 oz (98.544 kg)     Review of Systems:  As above. All other systems reviewed  and are otherwise negative except as noted above.  Physical Exam:  Blood pressure 110/60, pulse 63, height 6' (1.829 m), weight 219 lb 8 oz (99.565 kg).  General: Pleasant, NAD Psych:   Normal affect. Neuro: Alert and oriented X 3. Moves all extremities spontaneously. HEENT: Normal  Neck: Supple without bruits or JVD. Lungs:  Resp regular and unlabored, CTA. Heart: RRR no s3, s4, or murmurs. Abdomen: Soft, non-tender, non-distended, BS + x 4.  Extremities: No clubbing, cyanosis or edema.    Accessory Clinical Findings:  EKG - NSR, 63 bpm, RBBB  Other studies Reviewed: Additional studies/ records that were reviewed today include: as above.  Recent Labs: 10/03/2014: BUN 26; Creatinine 1.17; Magnesium 2.2; Potassium 4.8; Sodium 139    Assessment & Plan:  1. A-fib:  -Remains in NSR  -It was discovered he was only taking Lopressor 25 mg one time per day. Because of this he was tachycardic each morning -Stop lisinopril to allow for more blood pressure room as he does need to take Lopressor bid (if lisinopril were to remain on board he may drop his BP too low, thus leading to increased hypotension and dizziness) -Advise patient to take Lopressor 25 mg bid (he may try taking this either with the rest of his AM medications or 1 hour later, whichever works better for him) -Continue diltiazem 120 mg daily  -Continue Eliquis 5 mg bid  -CHADSVASc 4 giving him a 4.0% estimated risk annual risk of stroke   2. Labile blood pressures:  -Perhaps the recent diagnosis of new onset a-fib and the recent verified finding of a faulty blood pressure cuff are the undertones for his labile blood pressures  -Home blood pressures are much better since getting a new BP cuff -Continue to monitor with the above changes  3. CAD s/p 4 vessel CABG 1999:  -No anginal symptoms  -Reschedule Lexiscan Myoview - rescheduled for 3 weeks out at the patient's preference   -Now that he will be placed on long term  anticoagulation would discontinue aspirin therapy, with increased risk of bleeding seen in dual therapy  -Continue Lopressor as above   4. HLD:  -Continue Crestor 20 mg daily -He preferred to stop Zetia at this time  5. OSA on CPAP:  -Consider titration if the above persists   6. Carotid occlusive disease:  -Has outpatient follow up scheduled in Feb   Dispo:  -Keep follow up with Dr. Harding in December 13, 2014 -Keep vascular follow up 10/19/2014    , PA-C [facility] [address] [place], [place] [address] [phone] [facility] 10/04/2014, 7:21 PM   

## 2014-10-04 ENCOUNTER — Encounter: Payer: Self-pay | Admitting: Physician Assistant

## 2014-10-04 DIAGNOSIS — I480 Paroxysmal atrial fibrillation: Secondary | ICD-10-CM | POA: Insufficient documentation

## 2014-10-04 LAB — BASIC METABOLIC PANEL
BUN/Creatinine Ratio: 22 (ref 10–22)
BUN: 26 mg/dL (ref 8–27)
CO2: 22 mmol/L (ref 18–29)
Calcium: 9.7 mg/dL (ref 8.6–10.2)
Chloride: 100 mmol/L (ref 97–108)
Creatinine, Ser: 1.17 mg/dL (ref 0.76–1.27)
GFR calc Af Amer: 69 mL/min/{1.73_m2} (ref >59)
GFR calc non Af Amer: 59 mL/min/{1.73_m2} — ABNORMAL LOW (ref >59)
Glucose: 104 mg/dL — ABNORMAL HIGH (ref 65–99)
Potassium: 4.8 mmol/L (ref 3.5–5.2)
Sodium: 139 mmol/L (ref 134–144)

## 2014-10-04 LAB — MAGNESIUM: Magnesium: 2.2 mg/dL (ref 1.6–2.3)

## 2014-10-11 ENCOUNTER — Telehealth: Payer: Self-pay | Admitting: *Deleted

## 2014-10-11 DIAGNOSIS — I10 Essential (primary) hypertension: Secondary | ICD-10-CM | POA: Diagnosis not present

## 2014-10-11 DIAGNOSIS — I480 Paroxysmal atrial fibrillation: Secondary | ICD-10-CM | POA: Diagnosis not present

## 2014-10-11 NOTE — Telephone Encounter (Signed)
Spoke w/ [name].  Advised her of Ryan's recommendation.  She verbalizes understanding and will call back w/ any questions or concerns.

## 2014-10-11 NOTE — Telephone Encounter (Signed)
Yes, patient can continue taking OTC magnesium oxide 400 mg daily.

## 2014-10-11 NOTE — Telephone Encounter (Signed)
Pt significate called stating that pt was seen in hospital and was given instructions to start taking magnesium 400 mg with no refills Now the question is, pt has finished this, should he keep doing this  also could over the counter ones be okay, and the dosage, will it change. Please advise.

## 2014-10-18 ENCOUNTER — Encounter: Payer: Self-pay | Admitting: Family

## 2014-10-19 ENCOUNTER — Encounter: Payer: Self-pay | Admitting: Family

## 2014-10-19 ENCOUNTER — Ambulatory Visit (INDEPENDENT_AMBULATORY_CARE_PROVIDER_SITE_OTHER): Payer: Medicare Other | Admitting: Family

## 2014-10-19 ENCOUNTER — Ambulatory Visit (HOSPITAL_COMMUNITY)
Admission: RE | Admit: 2014-10-19 | Discharge: 2014-10-19 | Disposition: A | Discharge status: Home / Self Care | Payer: Medicare Other | Source: Ambulatory Visit | Attending: Family | Admitting: Family

## 2014-10-19 VITALS — BP 168/96 | HR 70 | Temp 97.0°F | Resp 16 | Ht 72.0 in | Wt 219.6 lb

## 2014-10-19 DIAGNOSIS — I739 Peripheral vascular disease, unspecified: Secondary | ICD-10-CM

## 2014-10-19 DIAGNOSIS — I6523 Occlusion and stenosis of bilateral carotid arteries: Secondary | ICD-10-CM | POA: Diagnosis not present

## 2014-10-19 DIAGNOSIS — Z9889 Other specified postprocedural states: Secondary | ICD-10-CM

## 2014-10-19 DIAGNOSIS — Z95828 Presence of other vascular implants and grafts: Secondary | ICD-10-CM

## 2014-10-19 DIAGNOSIS — Z48812 Encounter for surgical aftercare following surgery on the circulatory system: Secondary | ICD-10-CM

## 2014-10-19 NOTE — Patient Instructions (Signed)

## 2014-10-19 NOTE — Progress Notes (Signed)
Established Carotid/PAD Patient   History of Present Illness  [name] is a 81 y.o. male patient of Dr. Oneida Alar who is status post left carotid endarterectomy in 2007.  He is also previously had stenting of the left and right common iliac arteries.  He had some complaints in his lower extremities in the past.  He is currently on aspirin once daily. He denies any claudication symptoms at this point. However he states he has been diagnosed with sacroiliac joint problems and has some fatigue in his lower extremities with walking long distances. Chronic medical problems include hypertension, hyperlipidemia coronary artery disease all of which are currently stable.  At his last visit with Dr. Oneida Alar on 10/13/13 carotid Duplex was requested on 1 year follow up, he returns today for this.  The patient denies any history of TIA or stroke symptoms, specifically the patient denies a history of amaurosis fugax or monocular blindness, denies a history unilateral  of facial drooping, denies a history of hemiplegia, and denies a history of receptive or expressive aphasia.    The patient reports New Medical or Surgical History: diagnosed with a-fib and uncontrolled hypertension January 2016, hospitalized for 1 day at [facility]. His blood pressure at home this morning was 122/80 per pt, states his blood pressure has been about this lately.  He does a mild work out daily at [employer]. Pt has CAD, history of atrial fib, his cardiologist is Dr. [name].  Pt Diabetic: No Pt smoker: former smoker, quit in 1998  Pt meds include: Statin : Yes ASA: No Other anticoagulants/antiplatelets: Eliquis, history of atrial fib   Past Medical History  Diagnosis Date  . Atherosclerotic heart disease of native coronary artery without angina pectoris 1998    Multivessel disease on cath for angina: S/p CABG x 4 (LIMA-LAD, SVG-RCA, SVG-OM, SVG-DIAG); SVG-Diag occluded on cath 1999  . Atherosclerotic heart  disease of artery bypass graft 1999    a) Cardiac Cath : Occluded SVG-D1;; ostial Left Main 80-90%, diffuse LAD disease w/ D1 &2 subtotalled, LCx 70% proximal with competitive flow from SVG-OM, RCA 100% occluded; patent LIMA-LAD, LC-RCA and SVG-OM; b) Myoview 10/2010: Ex 6.5 min - 9 METS, HR to 149, No Ischemia or infarct, PVCs; c) Echo 10/2010: EF >55%, mild Aortic Sclerosis, Concentric Remodelling.  . Carotid artery occlusion without infarction 06/2006    a) High Grade L Carotid Stenosis on Doppler -> s/x L CEA; b) Carotid Doppler 10/2013:: Left CEA patent, R. ICA 60-70% stenosis  . 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  . Aortoiliac occlusive disease April 2007    Status post bilateral common iliac artery stenting  . Ischemic heart disease with chronotropic incompetence     Seen on CPET to evaluate Exertional Dyspnea. Only able to achieve 80% of peak heart rate; peak VO2 was 75% - but possibly underestimated.  . Hyperlipidemia with target LDL less than 70   . Essential hypertension   . OSA on CPAP   . Erectile dysfunction due to arterial insufficiency   . Sciatic pain     Chronic  . GERD (gastroesophageal reflux disease)   . PAF (paroxysmal atrial fibrillation)     Social History History  Substance Use Topics  . Smoking status: Former Smoker    Types: Cigarettes    Quit date: 08/04/1997  .  Smokeless tobacco: Never Used  . Alcohol Use: No    Family History Family History  Problem Relation Age of Onset  . Ulcers Mother     Peptic  . Dementia Mother   . Alcohol abuse Father     Surgical History Past Surgical History  Procedure Laterality Date  . Coronary artery bypass graft  1998    LIMA-LAD, SVG-OM, SVG-RPDA, SVG-DIAG  . Cardiac catheterization  01/30/1998      total native LAD andRCA ,mod  native CIRC; svg to RCA and OBTUSE MARG. PATENT the left internal mammary artery graft to LAD ; LV NORMAL  /  . Doppler echocardiography  11/20/2010    EF =>55%; LV norm mild aortic scelorosis  . Nm myocar perf wall motion  11/11/2010    STRESS----NORMAL PERFUSION ,EF 68%  . Cpet / met  12/2012    Mild chronotropic incompetence - read 82% of predicted; also reduced effort; peak VO2 15.7 / 75% (did not reach Max effort) -- suggested ischemic response in last 1.5 minutes of exercise. Normal pulmonary function on PFTs but poor response to  . Arch aortogram and carotid aortogram  06/02/2006    Dr Oneida Alar did surgery  . Iliac artery stent  12/15/2005    PTA and direct stenting rgt and lft common iliac arteries  . Thoracic aorta - carotid angiogram  October 2007    Dr. 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 Carotid endarterectomy Left Oct. 29, 2007    Dr. Oneida Alar:  . Hiatal hernia repair    . Ankle surgery    . Appendectomy      Allergies  Allergen Reactions  . Codeine   . Niacin And Related     Current Outpatient Prescriptions  Medication Sig Dispense Refill  . apixaban (ELIQUIS) 5 MG TABS tablet Take 5 mg by mouth 2 (two) times daily.    [name] dexlansoprazole (DEXILANT) 60 MG capsule Take 60 mg by mouth daily.    [name] diltiazem (CARDIZEM CD) 120 MG 24 hr capsule Take 120 mg by mouth daily.    . finasteride (PROSCAR) 5 MG tablet Take 5 mg by mouth daily.    . fish oil-omega-3 fatty acids 1000 MG capsule Take 2 g by mouth daily.    [name] lisinopril (PRINIVIL,ZESTRIL) 10 MG tablet Take 10 mg by mouth daily.    . magnesium oxide (MAG-OX) 400 MG tablet Take 400 mg by mouth daily.    . metoprolol tartrate (LOPRESSOR) 25 MG tablet Take 25 mg by mouth daily.    . nitroGLYCERIN (NITROSTAT) 0.4 MG SL tablet Place 1 tablet (0.4 mg total) under the tongue every 5 (five) minutes as needed for chest pain. 25 tablet 3  .  rosuvastatin (CRESTOR) 20 MG tablet Take 20 mg by mouth daily.    [name] ZETIA 10 MG tablet TAKE ONE TABLET BY MOUTH EVERY DAY 30 tablet 11   No current facility-administered medications for this visit.    Review of Systems : See HPI for pertinent positives and negatives.  Physical Examination  Filed Vitals:   10/19/14 1030 10/19/14 1034  BP: 160/101 168/96  Pulse: 70 70  Temp: 97 F (36.1 C)   TempSrc: Oral   Resp: 16   Height: 6' (1.829 m)   Weight: 219 lb 9.6 oz (99.61 kg)   SpO2: 99%    Body mass index is 29.78 kg/(m^2).  General: WDWN male in NAD, accompanied  by wife GAIT: normal Eyes: PERRLA Pulmonary:  Non-labored, CTAB, Negative  Rales, Negative rhonchi, & Negative wheezing.  Cardiac: regular Rhythm, no detected murmur.  VASCULAR EXAM Carotid Bruits Right Left   Negative Negative    Aorta is not palpable. Radial pulses are 2+ palpable and equal.                                                                                                                            LE Pulses Right Left       FEMORAL  1+ palpable 1+ palpable       POPLITEAL  not palpable   not palpable       POSTERIOR TIBIAL  faintly palpable   faintly palpable        DORSALIS PEDIS      ANTERIOR TIBIAL 2+ palpable  Not palpable     Gastrointestinal: soft, nontender, BS WNL, no r/g,  negative palpated masses.  Musculoskeletal: Negative muscle atrophy/wasting. M/S 5/5 throughout, Extremities without ischemic changes.  Neurologic: A&O X 3; Appropriate Affect, sensation is normal, Speech is normal CN 2-12 intact  Except has some hearing loss, Pain and light touch intact in extremities, Motor exam as listed above.   Non-Invasive Vascular Imaging CAROTID DUPLEX 10/19/2014   CEREBROVASCULAR DUPLEX EVALUATION    INDICATION: Carotid artery disease    PREVIOUS INTERVENTION(S): Left carotid endarterectomy 06/29/2006    DUPLEX EXAM: Carotid duplex    RIGHT  LEFT  Peak Systolic Velocities  (cm/s) End Diastolic Velocities (cm/s) Plaque LOCATION Peak Systolic Velocities (cm/s) End Diastolic Velocities (cm/s) Plaque  58 9 - CCA PROXIMAL 74 14 -  93 15 - CCA MID 67 15 -  89 15 HT CCA DISTAL 93 18 HM  88 9 - ECA 85 14 -  202 47 HT ICA PROXIMAL 70 16 -  107 18 - ICA MID 75 22 -  103 19 - ICA DISTAL 72 24 -    2.1 ICA / CCA Ratio (PSV) N/A  Antegrade Vertebral Flow Antegrade  282 Brachial Systolic Pressure (mmHg) 060  Triphasic Brachial Artery Waveforms Triphasic    Plaque Morphology:  HM = Homogeneous, HT = Heterogeneous, CP = Calcific Plaque, SP = Smooth Plaque, IP = Irregular Plaque     ADDITIONAL FINDINGS:     IMPRESSION: 1. 40 - 59% right internal carotid artery stenosis. 2. Patent left carotid endarterectomy site with no evidence for restenosis.    Compared to the previous exam:  Unable to obtain increased velocity as shown on prior exams.      Assessment: [name] is a 81 y.o. male who is status post left carotid endarterectomy in 2007.  He is also previously had stenting of the left and right common iliac arteries. He has no history of stroke or TIA. Today's carotid Duplex reveals 40 - 59% right internal carotid artery stenosis and a patent left carotid endarterectomy site with no evidence for restenosis.  The  ICA stenosis is  Improved from previous exam.  Face to face time with patient was 25 minutes. Over 50% of this time was spent on counseling and coordination of care.   Plan: Follow-up in 1 year with Carotid Duplex scan and bilateral iliac artery stent Duplex, ABI's.   I discussed in depth with the patient the nature of atherosclerosis, and emphasized the importance of maximal medical management including strict control of blood pressure, blood glucose, and lipid levels, obtaining regular exercise, and continued cessation of smoking.  The patient is aware that without maximal medical management the underlying atherosclerotic disease process will  progress, limiting the benefit of any interventions. The patient was given information about stroke prevention and what symptoms should prompt the patient to seek immediate medical care. Thank you for allowing [place] to participate in this patient's care.  Clemon Chambers, RN, MSN, FNP-C [facility] Office: [phone]  Clinic Physician: Oneida Alar  10/19/2014 9:44 AM

## 2014-10-20 NOTE — Addendum Note (Signed)
Addended by: Mena Goes on: 10/20/2014 04:47 PM   Modules accepted: Orders

## 2014-10-23 ENCOUNTER — Emergency Department: Payer: Self-pay | Admitting: Emergency Medicine

## 2014-10-23 DIAGNOSIS — I10 Essential (primary) hypertension: Secondary | ICD-10-CM | POA: Diagnosis not present

## 2014-10-23 DIAGNOSIS — R300 Dysuria: Secondary | ICD-10-CM | POA: Diagnosis not present

## 2014-10-23 DIAGNOSIS — Z79899 Other long term (current) drug therapy: Secondary | ICD-10-CM | POA: Diagnosis not present

## 2014-10-23 DIAGNOSIS — R350 Frequency of micturition: Secondary | ICD-10-CM | POA: Diagnosis not present

## 2014-10-23 DIAGNOSIS — Z87891 Personal history of nicotine dependence: Secondary | ICD-10-CM | POA: Diagnosis not present

## 2014-10-23 DIAGNOSIS — Z7952 Long term (current) use of systemic steroids: Secondary | ICD-10-CM | POA: Diagnosis not present

## 2014-10-23 DIAGNOSIS — Z7902 Long term (current) use of antithrombotics/antiplatelets: Secondary | ICD-10-CM | POA: Diagnosis not present

## 2014-10-23 DIAGNOSIS — R410 Disorientation, unspecified: Secondary | ICD-10-CM | POA: Diagnosis not present

## 2014-10-24 ENCOUNTER — Other Ambulatory Visit: Payer: Self-pay | Admitting: *Deleted

## 2014-10-24 ENCOUNTER — Telehealth: Payer: Self-pay | Admitting: *Deleted

## 2014-10-24 MED ORDER — APIXABAN 5 MG PO TABS: 5.0000 mg | ORAL_TABLET | Freq: Two times a day (BID) | ORAL | Status: DC

## 2014-10-24 MED ORDER — DILTIAZEM HCL ER COATED BEADS 120 MG PO CP24: 120.0000 mg | ORAL_CAPSULE | Freq: Every day | ORAL | Status: DC

## 2014-10-24 NOTE — Telephone Encounter (Signed)
Patient stated he is almost out of Eliquis and Diltiazem  He wants to make sure that Thurmond Butts still wants him to take these medications He also states he has been urinating more frequent  He is worried the medication is causing the frequent urination  Patients wife called and stated.........................   "Pt wife asking if we could call them back for pt was seen in ED for frequent urinating. Thier urologist suggested they should go to ED.  7 hours later found nothing. Pt is asking if this new medication could be causing this.  Seems to have calm down a bit. Pt was able to get some sleep. He was running a fever the night before.  Pt is suppose to have a stress test at [facility] now needs to know if she can do this."

## 2014-10-24 NOTE — Telephone Encounter (Signed)
1. Neither one of those medications should cause frequent urination.  2. Yes, he should continue them.  3. For his frequent urination he should follow back up with urology or PCP.  -Urine was normal in the ER on 2/22, however he did have a leukocytosis of 11.4 and total bilirubin of 2.0 that needs follow up.

## 2014-10-24 NOTE — Telephone Encounter (Signed)
°  1. Which medications need to be refilled? Eliquis and Diltiazem    2. Which pharmacy is medication to be sent to? [facility]   3. Do they need a 30 day or 90 day supply? Would rather have 90   4. Would they like a call back once the medication has been sent to the pharmacy? Yes   Pt wife asking if we could call them back for pt was seen in ED for frequent urinating. Thier urologist suggested they should go to ED.   7 hours later found nothing. Pt is asking if this new medication could be causing this.  Seems to have calm down a bit. Pt was able to get some sleep. He was running a fever the night before.  Pt is suppose to have a stress test at [facility] now needs to know if she can do this.  Please advise.

## 2014-10-24 NOTE — Telephone Encounter (Signed)
Reviewed Ryans response with patient  Patient verbalized understanding  Medications refilled

## 2014-10-25 ENCOUNTER — Ambulatory Visit: Payer: Self-pay | Admitting: Physician Assistant

## 2014-10-25 ENCOUNTER — Telehealth: Payer: Self-pay | Admitting: *Deleted

## 2014-10-25 DIAGNOSIS — I4891 Unspecified atrial fibrillation: Secondary | ICD-10-CM | POA: Diagnosis not present

## 2014-10-25 NOTE — Telephone Encounter (Signed)
Please call daughter with nuclear stress test results.

## 2014-10-26 DIAGNOSIS — N401 Enlarged prostate with lower urinary tract symptoms: Secondary | ICD-10-CM | POA: Diagnosis not present

## 2014-10-26 NOTE — Telephone Encounter (Signed)
Pt was told by cardiopulmonary tech that results would be available by the end of the day. These have not been interpreted yet.

## 2014-10-27 ENCOUNTER — Other Ambulatory Visit: Payer: Self-pay

## 2014-10-27 ENCOUNTER — Telehealth: Payer: Self-pay

## 2014-10-27 DIAGNOSIS — I2510 Atherosclerotic heart disease of native coronary artery without angina pectoris: Secondary | ICD-10-CM

## 2014-10-27 NOTE — Telephone Encounter (Signed)
Pt girlfriend, [name], states pt would like stress test results. Also pt has been VERY lethargic, and she cannot keep him awake, little appetite, very confused, states this has been going on for a week. States pt went to the ED on Monday, 2/22, ans was dx with a prostates infection, Dr. Suzan Garibaldi, urologist, has set him up an appt with Dr. [name], neurologist on 3/9

## 2014-10-28 ENCOUNTER — Inpatient Hospital Stay: Payer: Self-pay | Admitting: Internal Medicine

## 2014-10-28 DIAGNOSIS — Z66 Do not resuscitate: Secondary | ICD-10-CM | POA: Diagnosis present

## 2014-10-28 DIAGNOSIS — R0602 Shortness of breath: Secondary | ICD-10-CM | POA: Diagnosis not present

## 2014-10-28 DIAGNOSIS — D72829 Elevated white blood cell count, unspecified: Secondary | ICD-10-CM | POA: Diagnosis not present

## 2014-10-28 DIAGNOSIS — Z79899 Other long term (current) drug therapy: Secondary | ICD-10-CM | POA: Diagnosis not present

## 2014-10-28 DIAGNOSIS — E785 Hyperlipidemia, unspecified: Secondary | ICD-10-CM | POA: Diagnosis present

## 2014-10-28 DIAGNOSIS — J9601 Acute respiratory failure with hypoxia: Secondary | ICD-10-CM | POA: Diagnosis not present

## 2014-10-28 DIAGNOSIS — R0600 Dyspnea, unspecified: Secondary | ICD-10-CM | POA: Diagnosis not present

## 2014-10-28 DIAGNOSIS — R350 Frequency of micturition: Secondary | ICD-10-CM | POA: Diagnosis not present

## 2014-10-28 DIAGNOSIS — G4733 Obstructive sleep apnea (adult) (pediatric): Secondary | ICD-10-CM | POA: Diagnosis present

## 2014-10-28 DIAGNOSIS — Z87891 Personal history of nicotine dependence: Secondary | ICD-10-CM | POA: Diagnosis not present

## 2014-10-28 DIAGNOSIS — R5383 Other fatigue: Secondary | ICD-10-CM | POA: Diagnosis not present

## 2014-10-28 DIAGNOSIS — R410 Disorientation, unspecified: Secondary | ICD-10-CM | POA: Diagnosis not present

## 2014-10-28 DIAGNOSIS — F0390 Unspecified dementia without behavioral disturbance: Secondary | ICD-10-CM | POA: Diagnosis present

## 2014-10-28 DIAGNOSIS — N179 Acute kidney failure, unspecified: Secondary | ICD-10-CM | POA: Diagnosis not present

## 2014-10-28 DIAGNOSIS — Z9119 Patient's noncompliance with other medical treatment and regimen: Secondary | ICD-10-CM | POA: Diagnosis present

## 2014-10-28 DIAGNOSIS — R4182 Altered mental status, unspecified: Secondary | ICD-10-CM | POA: Diagnosis present

## 2014-10-28 DIAGNOSIS — Z883 Allergy status to other anti-infective agents status: Secondary | ICD-10-CM | POA: Diagnosis not present

## 2014-10-28 DIAGNOSIS — I10 Essential (primary) hypertension: Secondary | ICD-10-CM | POA: Diagnosis not present

## 2014-10-28 DIAGNOSIS — I739 Peripheral vascular disease, unspecified: Secondary | ICD-10-CM | POA: Diagnosis present

## 2014-10-28 DIAGNOSIS — K219 Gastro-esophageal reflux disease without esophagitis: Secondary | ICD-10-CM | POA: Diagnosis present

## 2014-10-28 DIAGNOSIS — T380X5A Adverse effect of glucocorticoids and synthetic analogues, initial encounter: Secondary | ICD-10-CM | POA: Diagnosis present

## 2014-10-28 DIAGNOSIS — R079 Chest pain, unspecified: Secondary | ICD-10-CM | POA: Diagnosis not present

## 2014-10-28 DIAGNOSIS — I480 Paroxysmal atrial fibrillation: Secondary | ICD-10-CM | POA: Diagnosis present

## 2014-10-28 DIAGNOSIS — Z793 Long term (current) use of hormonal contraceptives: Secondary | ICD-10-CM | POA: Diagnosis not present

## 2014-10-28 DIAGNOSIS — I2510 Atherosclerotic heart disease of native coronary artery without angina pectoris: Secondary | ICD-10-CM | POA: Diagnosis present

## 2014-10-28 DIAGNOSIS — R0902 Hypoxemia: Secondary | ICD-10-CM | POA: Diagnosis not present

## 2014-10-28 DIAGNOSIS — J440 Chronic obstructive pulmonary disease with acute lower respiratory infection: Secondary | ICD-10-CM | POA: Diagnosis not present

## 2014-10-28 DIAGNOSIS — F05 Delirium due to known physiological condition: Secondary | ICD-10-CM | POA: Diagnosis not present

## 2014-10-28 DIAGNOSIS — Z951 Presence of aortocoronary bypass graft: Secondary | ICD-10-CM | POA: Diagnosis not present

## 2014-10-28 DIAGNOSIS — Z8673 Personal history of transient ischemic attack (TIA), and cerebral infarction without residual deficits: Secondary | ICD-10-CM | POA: Diagnosis not present

## 2014-10-28 DIAGNOSIS — K449 Diaphragmatic hernia without obstruction or gangrene: Secondary | ICD-10-CM | POA: Diagnosis not present

## 2014-10-28 DIAGNOSIS — J441 Chronic obstructive pulmonary disease with (acute) exacerbation: Secondary | ICD-10-CM | POA: Diagnosis present

## 2014-10-28 DIAGNOSIS — N400 Enlarged prostate without lower urinary tract symptoms: Secondary | ICD-10-CM | POA: Diagnosis not present

## 2014-10-29 ENCOUNTER — Ambulatory Visit: Payer: Self-pay | Admitting: Neurology

## 2014-10-30 NOTE — Telephone Encounter (Signed)
ST looked OK.  See note by Thurmond Butts. I read the study last week.  [place]

## 2014-10-31 NOTE — Telephone Encounter (Signed)
Reviewed results with patients daughter

## 2014-11-07 DIAGNOSIS — J449 Chronic obstructive pulmonary disease, unspecified: Secondary | ICD-10-CM | POA: Diagnosis not present

## 2014-11-07 DIAGNOSIS — R0902 Hypoxemia: Secondary | ICD-10-CM | POA: Diagnosis not present

## 2014-11-07 DIAGNOSIS — J40 Bronchitis, not specified as acute or chronic: Secondary | ICD-10-CM | POA: Diagnosis not present

## 2014-11-07 DIAGNOSIS — E538 Deficiency of other specified B group vitamins: Secondary | ICD-10-CM | POA: Diagnosis not present

## 2014-11-08 DIAGNOSIS — J9601 Acute respiratory failure with hypoxia: Secondary | ICD-10-CM | POA: Diagnosis not present

## 2014-11-08 DIAGNOSIS — G4733 Obstructive sleep apnea (adult) (pediatric): Secondary | ICD-10-CM | POA: Diagnosis not present

## 2014-11-08 DIAGNOSIS — Z87891 Personal history of nicotine dependence: Secondary | ICD-10-CM | POA: Diagnosis not present

## 2014-11-08 DIAGNOSIS — R410 Disorientation, unspecified: Secondary | ICD-10-CM | POA: Insufficient documentation

## 2014-11-08 DIAGNOSIS — R413 Other amnesia: Secondary | ICD-10-CM | POA: Diagnosis not present

## 2014-11-08 DIAGNOSIS — F0390 Unspecified dementia without behavioral disturbance: Secondary | ICD-10-CM | POA: Diagnosis not present

## 2014-11-08 DIAGNOSIS — K219 Gastro-esophageal reflux disease without esophagitis: Secondary | ICD-10-CM | POA: Diagnosis not present

## 2014-11-08 DIAGNOSIS — I2510 Atherosclerotic heart disease of native coronary artery without angina pectoris: Secondary | ICD-10-CM | POA: Diagnosis not present

## 2014-11-08 DIAGNOSIS — F4489 Other dissociative and conversion disorders: Secondary | ICD-10-CM | POA: Insufficient documentation

## 2014-11-08 DIAGNOSIS — I10 Essential (primary) hypertension: Secondary | ICD-10-CM | POA: Diagnosis not present

## 2014-11-08 DIAGNOSIS — I739 Peripheral vascular disease, unspecified: Secondary | ICD-10-CM | POA: Diagnosis not present

## 2014-11-08 DIAGNOSIS — E785 Hyperlipidemia, unspecified: Secondary | ICD-10-CM | POA: Diagnosis not present

## 2014-11-08 DIAGNOSIS — I4891 Unspecified atrial fibrillation: Secondary | ICD-10-CM | POA: Diagnosis not present

## 2014-11-10 DIAGNOSIS — J40 Bronchitis, not specified as acute or chronic: Secondary | ICD-10-CM | POA: Diagnosis not present

## 2014-11-10 DIAGNOSIS — I4891 Unspecified atrial fibrillation: Secondary | ICD-10-CM | POA: Diagnosis not present

## 2014-11-10 DIAGNOSIS — J9601 Acute respiratory failure with hypoxia: Secondary | ICD-10-CM | POA: Diagnosis not present

## 2014-11-10 DIAGNOSIS — F0390 Unspecified dementia without behavioral disturbance: Secondary | ICD-10-CM | POA: Diagnosis not present

## 2014-11-10 DIAGNOSIS — I739 Peripheral vascular disease, unspecified: Secondary | ICD-10-CM | POA: Diagnosis not present

## 2014-11-10 DIAGNOSIS — G4733 Obstructive sleep apnea (adult) (pediatric): Secondary | ICD-10-CM | POA: Diagnosis not present

## 2014-11-10 DIAGNOSIS — I2510 Atherosclerotic heart disease of native coronary artery without angina pectoris: Secondary | ICD-10-CM | POA: Diagnosis not present

## 2014-11-14 ENCOUNTER — Encounter: Payer: Self-pay | Admitting: Internal Medicine

## 2014-11-14 ENCOUNTER — Ambulatory Visit (INDEPENDENT_AMBULATORY_CARE_PROVIDER_SITE_OTHER): Payer: Medicare Other | Admitting: Internal Medicine

## 2014-11-14 ENCOUNTER — Ambulatory Visit: Payer: Medicare Other | Admitting: Internal Medicine

## 2014-11-14 VITALS — BP 102/60 | HR 79 | Temp 97.8°F | Ht 72.0 in | Wt 214.0 lb

## 2014-11-14 DIAGNOSIS — J431 Panlobular emphysema: Secondary | ICD-10-CM

## 2014-11-14 DIAGNOSIS — I6523 Occlusion and stenosis of bilateral carotid arteries: Secondary | ICD-10-CM

## 2014-11-14 DIAGNOSIS — J849 Interstitial pulmonary disease, unspecified: Secondary | ICD-10-CM | POA: Insufficient documentation

## 2014-11-14 DIAGNOSIS — J410 Simple chronic bronchitis: Secondary | ICD-10-CM

## 2014-11-14 DIAGNOSIS — Z9989 Dependence on other enabling machines and devices: Secondary | ICD-10-CM

## 2014-11-14 DIAGNOSIS — J449 Chronic obstructive pulmonary disease, unspecified: Secondary | ICD-10-CM | POA: Diagnosis not present

## 2014-11-14 DIAGNOSIS — G4733 Obstructive sleep apnea (adult) (pediatric): Secondary | ICD-10-CM | POA: Diagnosis not present

## 2014-11-14 MED ORDER — ALBUTEROL SULFATE HFA 108 (90 BASE) MCG/ACT IN AERS: 2.0000 | INHALATION_SPRAY | RESPIRATORY_TRACT | Status: DC | PRN

## 2014-11-14 NOTE — Patient Instructions (Addendum)
Follow up with Dr. Stevenson Clinch in 1 month - pulmonary function testing and prior to follow up - High Resolution CT Chest without contrast in 3 months - use your BiPAP machine every night, 4-6 hours per night - continue with Advair 1 puff twice a day - please rinse and gargle after each use - continue with your incentive spirometry daily (25-30 times per day) - stop continuous oxygen  - Overnight pulse oximetry study. - rx albuterol inhaler - 2puff every 3-4 hours as needed for shortness of breath\wheezing\recurrent cough. - start back daily exercise as tolerated.

## 2014-11-14 NOTE — Progress Notes (Signed)
Date: 11/14/2014  MRN# [id number] [name] 11-04-1933  Referring Physician:   [name] is a 81 y.o. old male seen in consultation for   CC:  Chief Complaint  Patient presents with  . Advice Only    Pt was discharged from [facility] on 11/03/14. Pt was sent home with 3L continous 02. Pt denies cough,wheeze, chest tightess, and or sob.  [name] Hospitalization Follow-up    HPI:  Patient is a pleasant 81 year old male with a past medical history of hypertension, hyperlipidemia, obstructive sleep apnea on CPAP(not using), coronary artery disease who is seen today for hospital followup of acute respiratory failure. Detailed history as outlined hospital summarization below. Briefly, one week prior to admission patient started having gradual onset of shortness of breath, and confusion, he was seen at the emergency room initially for difficulty with urination, and was discharged from the emergency room at that time. The week progresses confusion shortness of breath continued and he represented to the emergency room chest x-ray showed probable pneumonia at that time. Since discharge patient states that he's been doing well his breathing is back to baseline, and he is back to attending the gym. Of note 1 month prior to admission he was in his usual state of health he attends a gym daily, he has a girlfriend that accompanies him. Further history reveals that he had upper respiratory tract infection urine December, was diagnosed with bronchitis, this cleared, one week prior to hospitalization he started having wheezing initially was thought to have a UTI, but as stated above this was negative, and confusion ensued along with hypoxia leading to admission to the hospital for approximately one week.  Today patient states that he is doing well he did not endorse any worsening shortness of breath, he actually states his shortness of breath is improving, he does have a history of obstructive sleep apnea and was  probably on CPAP but has not been wearing CPAP. He was diagnosed with OSA many years ago at sleep med. He denies any drug use, previously had dogs at home, previously employed in [employer] work, he does have a significant tobacco history previously smoked one to 2 packs per day for 32 years and quit a number of years ago.  Upon discharge from the hospital he was discharged on Advair, 3 L of oxygen continuously even at night and advised to followup to determine his outpatient oxygen needs. During his visit today he had a 6 minute walk test that showed lowest saturation on room air to 89%, he walked 936 feet, his highest heart rate was 108.  Patient also stated that he is currently on BiPAP, but his hospital discharge states CPAP.   [facility] hospitalization 10/28/2014 - 11/03/2014 DATE OF ADMISSION:  10/28/2014 DATE OF DISCHARGE:  11/03/2014  ADMISSION COMPLAINT: Shortness of breath and confusion.   DISCHARGE DIAGNOSES:  1. Acute respiratory failure with hypoxia.  2. Probable chronic obstructive pulmonary disease.  3. Obstructive sleep apnea, noncompliant with CPAP.  4. Dementia with delirium.  5. Atrial fibrillation.  6. Coronary artery disease status post coronary artery bypass grafting.  7. Benign prosthetic hypertrophy.  8. Acute renal failure, now resolved.  9. Leukocytosis thought to be due to steroid reaction.   CONSULTATIONS:  1. Leotis Pain, MD, neurology.  2. Anthonette Legato, MD, nephrology.  3.  Alethia Berthold, MD, psychiatry.   PROCEDURES:  1.  CT scan of the head without contrast shows mild diffuse cortical atrophy, mild chronic ischemic white matter disease, no  acute intracranial abnormalities.  2.  CT angiography of the chest for PE shows no evidence of pulmonary embolus. There are chronic interstitial changes suggestive of COPD.  Small hiatal hernia. Prior CABG. 3.  MRI of the brain without contrast shows no acute intracranial abnormality. Stable atrophy and diffuse white  matter disease. Remote lacunar infarcts of the basal ganglia bilaterally. 4.  Renal ultrasound March 4 shows cortical thinning bilaterally, no obstructive changes.   HISTORY OF PRESENT ILLNESS: This is a 81 year old Caucasian man with history of hypertension, hyperlipidemia, coronary artery disease, who was sent to the Emergency Room from home due to shortness of breath and confusion for 1 week. According to his girlfriend and daughter he was also sent to the Emergency Room 1 week prior to that due to difficulty with urination. After going home his urinary symptoms improved, but shortness of breath, weakness, and confusion became worse. He also had low-grade fever with chills, but denied abdominal pain, nausea, vomiting, or diarrhea. On presentation to the Emergency Room he was hypoxic with saturations at 88 on room air.   HOSPITAL COURSE BY PROBLEM:  1.  Acute respiratory failure with hypoxia: The patient was admitted and placed on supplemental oxygen. He was initially treated for COPD exacerbation with high-dose steroids and Levaquin and we were able to titrate his supplemental oxygen down to 1 liter. However, these medications seem to exacerbate his confusion/delirium so they were discontinued. Off of the steroids his oxygenation requirement is 2 liters via nasal cannula. He has had multiple chest x-rays, most recently on day of discharge which showed no active disease, no pneumonia. His CTA which was performed on admission was negative for pulmonary embolus, but does show changes suggestive of COPD.  He has a remote history of smoking.  2.  Probable COPD: Pulmonary function testing will need to be done in the outpatient setting to determine whether this patient has chronic COPD as a result of smoking. He is being discharged on Advair and Spiriva as well as supplemental oxygen. He would benefit from pulmonary consultation in the future. This was attempted upon discharge, but will need to be done through  his primary care office.  3.  Obstructive sleep apnea, noncompliant with CPAP for greater than 2 months prior to admission. I suspect that this is the cause of his respiratory failure and probably contributing significantly to his episodes of delirium. During the hospitalization he was encouraged multiple times to use his CPAP machine and indeed on nights when he used it he was much less confused. He is going home on supplemental oxygen which will be attached to his CPAP machine at night. 4.  Dementia and delirium: The patient presented with greater than 1 week of confusion. Episodes of disorientation particularly daytime/nighttime disorientation, making phone calls at night, trying to get people to come over to his house and pick him up in the middle of the night. These had been progressive for several weeks and persisted throughout the hospitalization, but were improving at the time of discharge. On presentation his urinalysis was negative for urinary tract infection. Chest x-ray negative for pneumonia. He did not exhibit any signs of other infection during the hospitalization. I suspect that he has chronic dementia which has been made worse recently by acute illness and then again by steroids, antibiotics, and unfamiliar environment of the hospital. At the time of discharge symptoms seem to be improving, but his family has been advised that he would be safest with 24 hour accompaniment. He  was seen by psychiatry during the hospitalization and no further psychiatric medications were recommended. 5.  Atrial fibrillation: Rates were well controlled throughout the hospitalization on his home regimen of metoprolol 25 mg b.i.d. He continues on Eliquis for stroke prevention. He also  continues on diltiazem.  6.  Coronary artery disease, status post CABG: No chest pain or signs of acute coronary syndrome. He continues on Zetia, Crestor, metoprolol, Eliquis, and diltiazem.  7.  Benign prosthetic hypertrophy: No  problems with this during hospitalization and UA was negative for signs of infection.  Continues on tamsulosin.  8.  Acute renal failure: During the hospitalization he did receive Lasix to attempt to improve his respiratory status. This created an acute renal failure which resolved with normal diet and hydration. On discharge his renal function is normal.  9.  Leukocytosis: I suspect this is a steroid reaction. He did not have a leukocytosis on presentation and this developed after administration of IV steroids to help with COPD exacerbation. This should be monitored in the outpatient setting by his primary care physician to insure that it decreases.   PMHX:   Past Medical History  Diagnosis Date  . Atherosclerotic heart disease of native coronary artery without angina pectoris 1998    Multivessel disease on cath for angina: S/p CABG x 4 (LIMA-LAD, SVG-RCA, SVG-OM, SVG-DIAG); SVG-Diag occluded on cath 1999  . 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  . Carotid artery occlusion without infarction 06/2006    a) High Grade L Carotid Stenosis on Doppler -> s/x L CEA; b) Carotid Doppler 10/2013:: Left CEA patent, R. ICA 60-70% stenosis  . 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  . Aortoiliac occlusive disease April 2007    Status post bilateral common iliac artery stenting  . Ischemic heart disease with chronotropic incompetence     Seen on CPET to  evaluate Exertional Dyspnea. Only able to achieve 80% of peak heart rate; peak VO2 was 75% - but possibly underestimated.  . Hyperlipidemia with target LDL less than 70   . Essential hypertension   . OSA on CPAP   . Erectile dysfunction due to arterial insufficiency   . Sciatic pain     Chronic  . GERD (gastroesophageal reflux disease)   . PAF (paroxysmal atrial fibrillation)    Surgical Hx:  Past Surgical History  Procedure Laterality Date  . Coronary artery bypass graft  1998    LIMA-LAD, SVG-OM, SVG-RPDA, SVG-DIAG  . Cardiac catheterization  01/30/1998      total native LAD andRCA ,mod native CIRC; svg to RCA and OBTUSE MARG. PATENT the left internal mammary artery graft to LAD ; LV NORMAL  /  . Doppler echocardiography  11/20/2010    EF =>55%; LV norm mild aortic scelorosis  . Nm myocar perf wall motion  11/11/2010    STRESS----NORMAL PERFUSION ,EF 68%  . Cpet / met  12/2012    Mild chronotropic incompetence - read 82%  of predicted; also reduced effort; peak VO2 15.7 / 75% (did not reach Max effort) -- suggested ischemic response in last 1.5 minutes of exercise. Normal pulmonary function on PFTs but poor response to  . Arch aortogram and carotid aortogram  06/02/2006    Dr Oneida Alar did surgery  . Iliac artery stent  12/15/2005    PTA and direct stenting rgt and lft common iliac arteries  . Thoracic aorta - carotid angiogram  October 2007    Dr. 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 Carotid endarterectomy Left Oct. 29, 2007    Dr. Oneida Alar:  . Hiatal hernia repair    . Ankle surgery    . Appendectomy     Family Hx:  Family History  Problem Relation Age of Onset  . Ulcers Mother     Peptic  . Dementia Mother   . Alcohol abuse Father    Social Hx:   History  Substance Use Topics  . Smoking status: Former Smoker    Types: Cigarettes    Quit date: 08/04/1997  . Smokeless tobacco:  Never Used  . Alcohol Use: No   Medication:   Current Outpatient Rx  Name  Route  Sig  Dispense  Refill  . apixaban (ELIQUIS) 5 MG TABS tablet   Oral   Take 1 tablet (5 mg total) by mouth 2 (two) times daily.   60 tablet   3   . Cyanocobalamin 1000 MCG TBCR   Oral   Take 100 mcg by mouth daily.         [name] dexlansoprazole (DEXILANT) 60 MG capsule   Oral   Take 60 mg by mouth daily.         [name] diltiazem (CARDIZEM CD) 120 MG 24 hr capsule   Oral   Take 1 capsule (120 mg total) by mouth daily.   30 capsule   3   . finasteride (PROSCAR) 5 MG tablet   Oral   Take 5 mg by mouth daily.         . Fluticasone-Salmeterol (ADVAIR) 250-50 MCG/DOSE AEPB   Inhalation   Inhale 1 puff into the lungs 2 (two) times daily.         . magnesium oxide (MAG-OX) 400 MG tablet   Oral   Take 400 mg by mouth daily.         . metoprolol tartrate (LOPRESSOR) 25 MG tablet   Oral   Take 25 mg by mouth 2 (two) times daily.          . nitroGLYCERIN (NITROSTAT) 0.4 MG SL tablet   Sublingual   Place 1 tablet (0.4 mg total) under the tongue every 5 (five) minutes as needed for chest pain.   25 tablet   3   . rosuvastatin (CRESTOR) 20 MG tablet   Oral   Take 20 mg by mouth daily.         . tamsulosin (FLOMAX) 0.4 MG CAPS capsule   Oral   Take 0.4 mg by mouth daily.         [name] ZETIA 10 MG tablet      TAKE ONE TABLET BY MOUTH EVERY DAY   30 tablet   11       Allergies:  Codeine and Niacin and related  Review of Systems: Gen:  Denies  fever, sweats, chills HEENT: Denies blurred vision, double vision, ear pain, eye pain, hearing loss, nose bleeds,  sore throat Cvc:  No dizziness, chest pain or heaviness Resp:   Admits to mild shortness of breath. No cough, no sputum production no wheezing Gi: Denies swallowing difficulty, stomach pain, nausea or vomiting, diarrhea, constipation, bowel incontinence Gu:  Denies bladder incontinence, burning urine Ext:   No Joint pain,  stiffness or swelling Skin: No skin rash, easy bruising or bleeding or hives Endoc:  No polyuria, polydipsia , polyphagia or weight change Psych: No depression, insomnia or hallucinations  Other:  All other systems negative  Physical Examination:   VS: BP 102/60 mmHg  Pulse 79  Temp(Src) 97.8 F (36.6 C) (Oral)  Ht 6' (1.829 m)  Wt 214 lb (97.07 kg)  BMI 29.02 kg/m2  SpO2 97% on 2L General Appearance: No distress  Neuro:without focal findings, mental status, speech normal, alert and oriented, cranial nerves 2-12 intact, reflexes normal and symmetric, sensation grossly normal  HEENT: PERRLA, EOM intact, no ptosis, no other lesions noticed; Mallampati 3 Pulmonary: good respiratory effort, fine basilar crackles (L>R), dry, no wheezes CardiovascularNormal S1,S2.  No m/r/g.  Abdominal aorta pulsation normal.    Abdomen: Benign, Soft, non-tender, No masses, hepatosplenomegaly, No lymphadenopathy Renal:  No costovertebral tenderness  GU:  No performed at this time. Endoc: No evident thyromegaly, no signs of acromegaly or Cushing features Skin:   warm, no rashes, no ecchymosis  Extremities: normal, no cyanosis, clubbing, no edema, warm with normal capillary refill. Other findings:   Rad results: (The following images and results were reviewed by Dr. Stevenson Clinch). 10/28/14 CT ANGIOGRAPHY CHEST WITH CONTRAST  TECHNIQUE: Multidetector CT imaging of the chest was performed using the standard protocol during bolus administration of intravenous contrast. Multiplanar CT image reconstructions and MIPs were obtained to evaluate the vascular anatomy.  CONTRAST: 75 cc Omnipaque 350 IV.  COMPARISON: Plain films earlier today.  FINDINGS: No filling defects in the pulmonary arteries to suggest pulmonary emboli. Moderate emphysema. Calcified granuloma in the right upper lobe. Peripheral ground-glass opacities and interstitial prominence likely reflect chronic interstitial lung disease. No  pleural effusions. There is a small hiatal hernia.  Heart is normal size. Prior CABG. Aorta is normal caliber. No mediastinal, hilar, or axillary adenopathy. Chest wall soft tissues are unremarkable. Imaging into the upper abdomen shows no acute findings.  No acute bony abnormality or focal bone lesion.  Review of the MIP images confirms the above findings.  IMPRESSION: No evidence of pulmonary embolus.  COPD/chronic interstitial changes.  Small hiatal hernia.  Prior CABG.   Assessment and Plan:81 year old male past medical history of coronary artery disease, obstructive sleep apnea on BiPAP, COPD, previous tobacco abuse, seen in his hospital followup for hypoxia and respiratory failure.  OSA on CPAP Patient states that he is currently on BiPAP, he does not know his current settings. Patient was counseled and educated on the risks of untreated sleep apnea, he stated he would continue using his BiPAP now every night. He states that his noncompliance was due to being ill on and off over the past 2 months.   Plan: -Continue with BiPAP on a nightly basis, 4-6 hours minimum. -We will try to obtain records from sleep med to determine if patient is actually on BiPAP or CPAP, however, he should continue with his nightly noninvasive positive pressure ventilation for his sleep apnea. - ONO to determine if patient needs oxygen with NIVPPV (non-invasive positive pressure ventilation, CPAP, Bipap, etc).      COPD (chronic obstructive pulmonary disease) Patient of prolonged history of smoking in the  past however quit a number of years ago, currently on Advair 250/50.  I have reviewed the patient's CT scan from his recent hospitalization, it does show that he has moderate emphysema, calcified granuloma in the right upper lobe he does have some peripheral groundglass opacities bilaterally that could be sequelae of his recent infection however unlikely in his most probable some type of chronic  ILD.  He was discharged on 2-3 L of oxygen continuously, today on room air he had saturations above 88%, a 6 minute walk test did not show any desaturations below 88%. His continuous oxygen was DC'd today.  Plan: -Continue Advair -Continue with tobacco cessation -Continue her diet and exercise -continue using NIVPPV (non-invasive positive pressure ventilation, CPAP, Bipap, etc) for OSA. -continue with incentive spirometry daily (25-30 times per day). -Stop continuous oxygen -Overnight pulse oximetry study -Rescue inhaler prescription given.  -will plan for pulmonary function testing in 2-3 months once he is completely recovered from his current hospitalization   ILD (interstitial lung disease) Patient with recent hospitalization, CT scan during this hospitalization showed chronic interstitial processes and peripheral groundglass opacities.  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 He does have a history of COPD and is currently on Advair to 250/50. These peripheral groundglass opacities will be further evaluated with a high-resolution CT of chest in 3 months.  Plan: -Continue with COPD optimization, continue with OSA optimization. -High-resolution CT chest in 3 months     Updated Medication List Outpatient Encounter Prescriptions as of 11/14/2014  Medication Sig  . apixaban (ELIQUIS) 5 MG TABS tablet Take 1 tablet (5 mg total) by mouth 2 (two) times daily.  . Cyanocobalamin 1000 MCG TBCR Take 100 mcg by mouth daily.  [name] dexlansoprazole (DEXILANT) 60 MG capsule Take 60 mg by mouth daily.  [name] diltiazem (CARDIZEM CD) 120 MG 24 hr capsule Take 1 capsule (120 mg total) by mouth daily.  . finasteride (PROSCAR) 5 MG tablet Take 5 mg by mouth daily.  . Fluticasone-Salmeterol (ADVAIR) 250-50 MCG/DOSE AEPB Inhale 1 puff into the lungs 2 (two) times daily.  . magnesium oxide (MAG-OX) 400 MG tablet Take 400 mg by mouth daily.  . metoprolol  tartrate (LOPRESSOR) 25 MG tablet Take 25 mg by mouth 2 (two) times daily.   . nitroGLYCERIN (NITROSTAT) 0.4 MG SL tablet Place 1 tablet (0.4 mg total) under the tongue every 5 (five) minutes as needed for chest pain.  . rosuvastatin (CRESTOR) 20 MG tablet Take 20 mg by mouth daily.  . tamsulosin (FLOMAX) 0.4 MG CAPS capsule Take 0.4 mg by mouth daily.  [name] ZETIA 10 MG tablet TAKE ONE TABLET BY MOUTH EVERY DAY  . albuterol (PROVENTIL HFA;VENTOLIN HFA) 108 (90 BASE) MCG/ACT inhaler Inhale 2 puffs into the lungs every 4 (four) hours as needed for wheezing or shortness of breath.  . [DISCONTINUED] fish oil-omega-3 fatty acids 1000 MG capsule Take 2 g by mouth daily.  . [DISCONTINUED] lisinopril (PRINIVIL,ZESTRIL) 10 MG tablet Take 10 mg by mouth daily.    Orders for this visit: No orders of the defined types were placed in this encounter.   Thank  you for the consultation and for allowing [facility] to assist in the care of your patient. Our recommendations are noted above.  Please contact us if we can be of further service.   Vilinda Boehringer, MD [facility] Office Number: [phone]

## 2014-11-15 ENCOUNTER — Other Ambulatory Visit: Payer: Self-pay | Admitting: Internal Medicine

## 2014-11-15 ENCOUNTER — Telehealth: Payer: Self-pay | Admitting: Internal Medicine

## 2014-11-15 DIAGNOSIS — G4733 Obstructive sleep apnea (adult) (pediatric): Secondary | ICD-10-CM | POA: Diagnosis not present

## 2014-11-15 DIAGNOSIS — I2510 Atherosclerotic heart disease of native coronary artery without angina pectoris: Secondary | ICD-10-CM | POA: Diagnosis not present

## 2014-11-15 DIAGNOSIS — J849 Interstitial pulmonary disease, unspecified: Secondary | ICD-10-CM

## 2014-11-15 DIAGNOSIS — F0390 Unspecified dementia without behavioral disturbance: Secondary | ICD-10-CM | POA: Diagnosis not present

## 2014-11-15 DIAGNOSIS — I739 Peripheral vascular disease, unspecified: Secondary | ICD-10-CM | POA: Diagnosis not present

## 2014-11-15 DIAGNOSIS — I4891 Unspecified atrial fibrillation: Secondary | ICD-10-CM | POA: Diagnosis not present

## 2014-11-15 DIAGNOSIS — J9601 Acute respiratory failure with hypoxia: Secondary | ICD-10-CM | POA: Diagnosis not present

## 2014-11-15 DIAGNOSIS — J449 Chronic obstructive pulmonary disease, unspecified: Secondary | ICD-10-CM

## 2014-11-15 NOTE — Telephone Encounter (Signed)
Called spoke with pt. He reports he received a call today from Dr. Merian Capron nurse about some test needing to be done. I do not see this in epic. Pt reports we can call him back tomorrow morning. Please advise Davy Pique thanks

## 2014-11-15 NOTE — Telephone Encounter (Signed)
Dr. Stevenson Clinch reviewed sleep study and will be ordering sleep titration. I called patient and left message to call our office back.

## 2014-11-16 ENCOUNTER — Encounter: Payer: Self-pay | Admitting: *Deleted

## 2014-11-16 ENCOUNTER — Other Ambulatory Visit: Payer: Self-pay | Admitting: Internal Medicine

## 2014-11-16 DIAGNOSIS — G4733 Obstructive sleep apnea (adult) (pediatric): Secondary | ICD-10-CM

## 2014-11-16 NOTE — Telephone Encounter (Signed)
Called and spoke to pt. Informed pt of the results and recs per VM. Will forward to [facility] if anything else is needed.

## 2014-11-17 NOTE — Telephone Encounter (Signed)
Order placed on 3/17. Nothing further needed.

## 2014-11-20 ENCOUNTER — Encounter: Payer: Self-pay | Admitting: Internal Medicine

## 2014-11-21 ENCOUNTER — Telehealth: Payer: Self-pay | Admitting: Internal Medicine

## 2014-11-21 DIAGNOSIS — G4733 Obstructive sleep apnea (adult) (pediatric): Secondary | ICD-10-CM | POA: Diagnosis not present

## 2014-11-21 DIAGNOSIS — I2510 Atherosclerotic heart disease of native coronary artery without angina pectoris: Secondary | ICD-10-CM | POA: Diagnosis not present

## 2014-11-21 DIAGNOSIS — I739 Peripheral vascular disease, unspecified: Secondary | ICD-10-CM | POA: Diagnosis not present

## 2014-11-21 DIAGNOSIS — I4891 Unspecified atrial fibrillation: Secondary | ICD-10-CM | POA: Diagnosis not present

## 2014-11-21 DIAGNOSIS — J9601 Acute respiratory failure with hypoxia: Secondary | ICD-10-CM | POA: Diagnosis not present

## 2014-11-21 DIAGNOSIS — F0390 Unspecified dementia without behavioral disturbance: Secondary | ICD-10-CM | POA: Diagnosis not present

## 2014-11-21 NOTE — Telephone Encounter (Signed)
Called and spoke to pt's significant other, [name]. [name] sated they have not heard from [facility] regarding ONO. Order already placed. Called and spoke to [place] at [facility]. Otila Kluver stated they are issuing ONO's at a high volume recently and once units have been returned then they will give pt a call to get ONO set up. Informed [name], she verbalized understanding and denied any further questions or concerns at this time.

## 2014-11-22 DIAGNOSIS — G4733 Obstructive sleep apnea (adult) (pediatric): Secondary | ICD-10-CM | POA: Diagnosis not present

## 2014-11-22 DIAGNOSIS — F0390 Unspecified dementia without behavioral disturbance: Secondary | ICD-10-CM | POA: Diagnosis not present

## 2014-11-22 DIAGNOSIS — J9601 Acute respiratory failure with hypoxia: Secondary | ICD-10-CM | POA: Diagnosis not present

## 2014-11-22 DIAGNOSIS — I2510 Atherosclerotic heart disease of native coronary artery without angina pectoris: Secondary | ICD-10-CM | POA: Diagnosis not present

## 2014-11-23 ENCOUNTER — Telehealth: Payer: Self-pay | Admitting: Internal Medicine

## 2014-11-23 NOTE — Assessment & Plan Note (Addendum)
Patient of prolonged history of smoking in the past however quit a number of years ago, currently on Advair 250/50.  I have reviewed the patient's CT scan from his recent hospitalization, it does show that he has moderate emphysema, calcified granuloma in the right upper lobe he does have some peripheral groundglass opacities bilaterally that could be sequelae of his recent infection however unlikely in his most probable some type of chronic ILD.  He was discharged on 2-3 L of oxygen continuously, today on room air he had saturations above 88%, a 6 minute walk test did not show any desaturations below 88%. His continuous oxygen was DC'd today.  Plan: -Continue Advair -Continue with tobacco cessation -Continue her diet and exercise -continue using NIVPPV (non-invasive positive pressure ventilation, CPAP, Bipap, etc) for OSA. -continue with incentive spirometry daily (25-30 times per day). -Stop continuous oxygen -Overnight pulse oximetry study -Rescue inhaler prescription given.  -will plan for pulmonary function testing in 2-3 months once he is completely recovered from his current hospitalization

## 2014-11-23 NOTE — Assessment & Plan Note (Addendum)
Patient with recent hospitalization, CT scan during this hospitalization showed chronic interstitial processes and peripheral groundglass opacities.  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 He does have a history of COPD and is currently on Advair to 250/50. These peripheral groundglass opacities will be further evaluated with a high-resolution CT of chest in 3 months.  Plan: -Continue with COPD optimization, continue with OSA optimization. -High-resolution CT chest in 3 months

## 2014-11-23 NOTE — Assessment & Plan Note (Addendum)
Patient states that he is currently on BiPAP, he does not know his current settings. Patient was counseled and educated on the risks of untreated sleep apnea, he stated he would continue using his BiPAP now every night. He states that his noncompliance was due to being ill on and off over the past 2 months.   Plan: -Continue with BiPAP on a nightly basis, 4-6 hours minimum. -We will try to obtain records from sleep med to determine if patient is actually on BiPAP or CPAP, however, he should continue with his nightly noninvasive positive pressure ventilation for his sleep apnea. - ONO to determine if patient needs oxygen with NIVPPV (non-invasive positive pressure ventilation, CPAP, Bipap, etc).

## 2014-11-23 NOTE — Telephone Encounter (Signed)
Per 11/14/14 OV order for ONO: Scheduling Instructions     DME--[facility] Room air  --  I called made [name] aware of above. Nothing further needed

## 2014-11-28 DIAGNOSIS — J40 Bronchitis, not specified as acute or chronic: Secondary | ICD-10-CM | POA: Diagnosis not present

## 2014-11-28 LAB — CBC AND DIFFERENTIAL
HCT: 41 % (ref 41–53)
Hemoglobin: 13.8 g/dL (ref 13.5–17.5)
Neutrophils Absolute: 7 /uL
Platelets: 208 10*3/uL (ref 150–399)
WBC: 10.1 10^3/mL

## 2014-11-29 ENCOUNTER — Other Ambulatory Visit: Payer: Self-pay | Admitting: *Deleted

## 2014-11-29 ENCOUNTER — Other Ambulatory Visit: Payer: Self-pay | Admitting: Internal Medicine

## 2014-11-29 DIAGNOSIS — R0989 Other specified symptoms and signs involving the circulatory and respiratory systems: Secondary | ICD-10-CM

## 2014-11-29 DIAGNOSIS — J849 Interstitial pulmonary disease, unspecified: Secondary | ICD-10-CM

## 2014-11-29 DIAGNOSIS — J431 Panlobular emphysema: Secondary | ICD-10-CM

## 2014-11-29 DIAGNOSIS — I4891 Unspecified atrial fibrillation: Secondary | ICD-10-CM

## 2014-11-29 DIAGNOSIS — G4733 Obstructive sleep apnea (adult) (pediatric): Secondary | ICD-10-CM

## 2014-11-29 DIAGNOSIS — I2510 Atherosclerotic heart disease of native coronary artery without angina pectoris: Secondary | ICD-10-CM

## 2014-11-30 ENCOUNTER — Encounter: Payer: Self-pay | Admitting: *Deleted

## 2014-11-30 ENCOUNTER — Telehealth: Payer: Self-pay | Admitting: *Deleted

## 2014-11-30 NOTE — Telephone Encounter (Signed)
-----   Message from Vilinda Boehringer, MD sent at 11/28/2014  3:59 PM EDT ----- Yes that is fine. O2 with Bipap ----- Message -----    From: Devona Konig, CMA    Sent: 11/28/2014   3:34 PM      To: Vilinda Boehringer, MD  Yesterday Mr. Witt ONO came and you wanted him on 2L 02 at bedtime. Patient is also on Bipap, Do you want 02 on Bipap?

## 2014-12-04 ENCOUNTER — Telehealth: Payer: Self-pay

## 2014-12-04 ENCOUNTER — Other Ambulatory Visit: Payer: Self-pay

## 2014-12-04 MED ORDER — APIXABAN 5 MG PO TABS: 5.0000 mg | ORAL_TABLET | Freq: Two times a day (BID) | ORAL | Status: DC

## 2014-12-04 MED ORDER — DILTIAZEM HCL ER COATED BEADS 120 MG PO CP24: 120.0000 mg | ORAL_CAPSULE | Freq: Every day | ORAL | Status: DC

## 2014-12-04 NOTE — Telephone Encounter (Signed)
3 month supply

## 2014-12-04 NOTE — Telephone Encounter (Signed)
Pt needs 3 month rx for Diltiaz

## 2014-12-11 DIAGNOSIS — L570 Actinic keratosis: Secondary | ICD-10-CM | POA: Diagnosis not present

## 2014-12-11 DIAGNOSIS — X32XXXA Exposure to sunlight, initial encounter: Secondary | ICD-10-CM | POA: Diagnosis not present

## 2014-12-11 DIAGNOSIS — D485 Neoplasm of uncertain behavior of skin: Secondary | ICD-10-CM | POA: Diagnosis not present

## 2014-12-11 DIAGNOSIS — Z85828 Personal history of other malignant neoplasm of skin: Secondary | ICD-10-CM | POA: Diagnosis not present

## 2014-12-11 DIAGNOSIS — L821 Other seborrheic keratosis: Secondary | ICD-10-CM | POA: Diagnosis not present

## 2014-12-11 DIAGNOSIS — C44319 Basal cell carcinoma of skin of other parts of face: Secondary | ICD-10-CM | POA: Diagnosis not present

## 2014-12-12 ENCOUNTER — Ambulatory Visit: Admit: 2014-12-12 | Disposition: A | Discharge status: Home / Self Care | Payer: Self-pay | Admitting: Internal Medicine

## 2014-12-12 DIAGNOSIS — G4730 Sleep apnea, unspecified: Secondary | ICD-10-CM | POA: Diagnosis not present

## 2014-12-12 DIAGNOSIS — G4733 Obstructive sleep apnea (adult) (pediatric): Secondary | ICD-10-CM | POA: Diagnosis not present

## 2014-12-13 ENCOUNTER — Encounter: Payer: Self-pay | Admitting: Cardiology

## 2014-12-13 ENCOUNTER — Ambulatory Visit (INDEPENDENT_AMBULATORY_CARE_PROVIDER_SITE_OTHER): Payer: Medicare Other | Admitting: Cardiology

## 2014-12-13 VITALS — BP 130/82 | HR 72 | Ht 72.0 in | Wt 224.2 lb

## 2014-12-13 DIAGNOSIS — I2510 Atherosclerotic heart disease of native coronary artery without angina pectoris: Secondary | ICD-10-CM

## 2014-12-13 DIAGNOSIS — I739 Peripheral vascular disease, unspecified: Secondary | ICD-10-CM

## 2014-12-13 DIAGNOSIS — I7409 Other arterial embolism and thrombosis of abdominal aorta: Secondary | ICD-10-CM

## 2014-12-13 DIAGNOSIS — I480 Paroxysmal atrial fibrillation: Secondary | ICD-10-CM | POA: Diagnosis not present

## 2014-12-13 DIAGNOSIS — R600 Localized edema: Secondary | ICD-10-CM

## 2014-12-13 DIAGNOSIS — G4733 Obstructive sleep apnea (adult) (pediatric): Secondary | ICD-10-CM

## 2014-12-13 DIAGNOSIS — R0600 Dyspnea, unspecified: Secondary | ICD-10-CM

## 2014-12-13 DIAGNOSIS — R0609 Other forms of dyspnea: Secondary | ICD-10-CM

## 2014-12-13 DIAGNOSIS — R0602 Shortness of breath: Secondary | ICD-10-CM

## 2014-12-13 DIAGNOSIS — I6523 Occlusion and stenosis of bilateral carotid arteries: Secondary | ICD-10-CM | POA: Diagnosis not present

## 2014-12-13 DIAGNOSIS — I779 Disorder of arteries and arterioles, unspecified: Secondary | ICD-10-CM

## 2014-12-13 DIAGNOSIS — I10 Essential (primary) hypertension: Secondary | ICD-10-CM

## 2014-12-13 DIAGNOSIS — E785 Hyperlipidemia, unspecified: Secondary | ICD-10-CM

## 2014-12-13 DIAGNOSIS — Z9989 Dependence on other enabling machines and devices: Secondary | ICD-10-CM

## 2014-12-13 MED ORDER — LISINOPRIL 2.5 MG PO TABS: 2.5000 mg | ORAL_TABLET | Freq: Two times a day (BID) | ORAL | Status: DC

## 2014-12-13 MED ORDER — FUROSEMIDE 20 MG PO TABS: 20.0000 mg | ORAL_TABLET | ORAL | Status: DC | PRN

## 2014-12-13 MED ORDER — DILTIAZEM HCL ER COATED BEADS 120 MG PO CP24: 120.0000 mg | ORAL_CAPSULE | ORAL | Status: DC | PRN

## 2014-12-13 NOTE — Patient Instructions (Addendum)
Your physician has recommended you make the following change in your medication:  1) RESUME Lisinopril 2.5mg  twice daily 2) START Lasix 20mg  daily as needed for swelling. An Rx has been sent to your pharmacy 3) TAKE Diltiazem 120mg  as needed for fast heartrates >110bpm  Ok to Hold Eliquis for a dose if needed for excessive bruising  Your physician recommends that you schedule a follow-up appointment in: 3 months with Dr.Harding

## 2014-12-13 NOTE — Progress Notes (Signed)
PCP: Margarita Rana, MD  Clinic Note: Chief Complaint  Patient presents with  . other    3 month f/u c/o sob. Meds reviewed verbally with pt.  . Atrial Fibrillation  . Coronary Artery Disease    History of CABG  . Leg Swelling    HPI: [name] is a 81 y.o. male with a PMH below who presents today for 2 month follow-up after having seen Christell Faith, PA-C on February 2 for hospital follow-up. PMH of CAD s/p CABGx4 in 1999, carotid artery occlusion s/p L CEA, ischemic heart disease with chronotropic incompetence, HLD, HTN, OSA on CPAP, aortoiliac occlusive disease s/p common iliac artery stenting 2007, and GERD who presents for hospital follow up after a recent admission to [facility] from 1/27-1/28 for new onset a-fib with RVR with HR in the 170s as of 09/27/2014 (now in NSR). Apparently was noted to be in A. fib RVR when he came in for his stress test. He is asymptomatic with stable blood pressures. Heart rate improved after IV diltiazem. Spontaneously converted after second dose of diltiazem. Following his hospital stay, patient had been very active back working out with treadmill exercise bicycle. Osteopenia 20 minutes each. Lisinopril dose was increased to 5 mg twice a day. Myoview that or even order was performed showing no evidence of ischemia. Echocardiogram was recommended however this was not done through the tone system. Apparently was done while in the hospital at [facility]. No regional wall motion abnormality noted - with normal LV function.. Was started on Eliquis for CHADSVASc of 4. Lopressor 25 twice a day for rate control along with diltiazem 120 mg daily. He has had some mild hypotension since starting these medications.  Unfortunately, he was admitted once again to [facility] from February 27 of March 4 with shortness of breath and confusion to be related to bronchitis.  This hospital stay was complicated by a biliary and altered mental status all with mild acute renal failure.  He did  not however appear to be in heart failure.   It was also thought that him being noncompliant with his status CPAP for OSA was partly involved. His confusion was notably improved when actually using CPAP.  He was seen by Pulmonary Medicine (Dr. Stevenson Clinch).  Reportedly his heart rates were well-controlled with A. Fib and had no issues. No signs of ACS.  CT the chest showed no PE but showed chronic interstitial changes suggestive of COPD.  CT of the head showed diffuse cortical atrophy and mild chronic ischemic white matter disease but no acute intracranial abnormalities   MRI of the brain showed no acute intracranial abnormality. Stable atrophy and diffuse white matter disease with remote lacunar infarcts and basal ganglia.  Renal ultrasound on March 4 showed cortical thinning bilaterally but no obstructive changes.  Past Medical History  Diagnosis Date  . Atherosclerotic heart disease of native coronary artery without angina pectoris 1998    Multivessel disease on cath for angina: S/p CABG x 4 (LIMA-LAD, SVG-RCA, SVG-OM, SVG-DIAG); SVG-Diag occluded on cath 1999  . Atherosclerotic heart disease of artery bypass graft 1999    a) Cardiac Cath : Occluded SVG-D1;; ostial LM 80-90%, diffuse LAD dz w/ D1 &2 subtotalled, LCx 70% prox with competitive flow from SVG-OM, RCA 100% occluded; patent LIMA-LAD, LC-RCA and SVG-OM; b) TM Myoview 10/2010: Ex 6.5 min - 9 METS, HR to 149, No Ischemia or infarct, PVCs; c) Echo 10/2010: EF >55%, mild AoSclerosis, Conc Remodelling.; Myoview 2/ 2016: EF 55%. Low  risk. No ischemia.  . Carotid artery occlusion without infarction 06/2006    a) High Grade L Carotid Stenosis on Doppler -> s/x L CEA; b) Carotid Doppler 10/2013:: Left CEA patent, R. ICA 60-70% stenosis  . 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  . Aortoiliac occlusive disease April 2007    Status post bilateral common iliac artery stenting  . Ischemic heart disease with chronotropic incompetence     Seen on CPET to evaluate Exertional Dyspnea. Only able to achieve 80% of peak heart rate; peak VO2 was 75% - but possibly underestimated.  . Hyperlipidemia with target LDL less than 70   . Essential hypertension   . OSA on CPAP   . Erectile dysfunction due to arterial insufficiency   . Sciatic pain     Chronic  . GERD (gastroesophageal reflux disease)   . PAF (paroxysmal atrial fibrillation) February 2016    Anticoagulated with Eliquis. On combination beta blocker and calcium channel blocker  . Colon polyps   . TIA (transient ischemic attack)   . Insomnia   . Memory loss   . Lichen planus    recently saw Dr. Oneida Alar to followup his carotid and iliac disease. - Stable  Prior Cardiac Evaluation and Past Surgical History: Past Surgical History  Procedure Laterality Date  . Coronary artery bypass graft  1998    LIMA-LAD, SVG-OM, SVG-RPDA, SVG-DIAG  . Cardiac catheterization  01/30/1998      total native LAD andRCA ,mod native CIRC; svg to RCA and OBTUSE MARG. PATENT the left internal mammary artery graft to LAD ; LV NORMAL  /  . Doppler echocardiography  11/20/2010    EF =>55%; LV norm mild aortic scelorosis  . Nm myocar perf wall motion  11/11/2010; February 2016    a) TM STRESS----NORMAL PERFUSION ,EF 68%; b) EF 50-55%. Normal LV function. No significant ischemia or infarction.  [name] Cpet / met  12/2012    Mild chronotropic incompetence - read 82% of predicted; also reduced effort; peak VO2 15.7 / 75% (did not reach Max effort) -- suggested ischemic response in last 1.5 minutes of exercise. Normal pulmonary function on PFTs but poor response to  . Arch aortogram and carotid aortogram  06/02/2006    Dr Oneida Alar did surgery  . Iliac artery stent  12/15/2005    PTA and direct stenting rgt and lft common iliac arteries  .  Thoracic aorta - carotid angiogram  October 2007    Dr. 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 Carotid endarterectomy Left Oct. 29, 2007    Dr. Oneida Alar:  . Ankle surgery    . Appendectomy    . Hiatal hernia repair    . Transthoracic echocardiogram  February 2016    [facility]: Normal LV function. Dilated left atrium.   Interval History: [facility]cy presents today quite confused really would everything is going on. He had a stent in the mid to be on Eliquis, but has had quite a bit of a nuisance bleeding, no significant bleeding. He is also noted having significant lower edema up to his knees with custom redness discoloration of the umbilicus. This started once he was on diltiazem. Is now back to using his CPAP at nighttime oxygen after seeing Dr. Stevenson Clinch from [facility] &  apparently has scheduled appointment for her sleep study to titrate settings.  With the exception of the swelling in bruising he does feel low at paragraph date of his hospital stays, but is really starting now to try to get back into an exercise routine but is doing "low she "exercises and has gotten back on the bicycle but not yet on the treadmill. He is doing some upper body exercises but not vigorous. He denies any sensation of rapid irregular heart beats except sometimes when he is exercising he may feel some irregular beats.  The remainder of his Cardiovascular ROS is as follows: positive for - edema, irregular heartbeat and orthopnea negative for - chest pain, murmur, palpitations, paroxysmal nocturnal dyspnea, rapid heart rate, shortness of breath or syncope/near syncope, TIA/amaurosis fugax symptoms.  No chest pain or shortness of breath with rest or exertion. No PND, orthopnea or edema. No palpitations, lightheadedness, dizziness, weakness or syncope/near syncope, TIA/amaurosis fugax symptoms. No melena, hematochezia, hematuria, or epstaxis. No  claudication.  ROS: A comprehensive was performed. Review of Systems  Constitutional: Negative for malaise/fatigue.  HENT: Negative for nosebleeds.   Eyes: Negative for pain and discharge.  Respiratory: Negative for cough, sputum production, shortness of breath and wheezing.   Cardiovascular: Positive for leg swelling. Negative for chest pain.  Gastrointestinal: Negative for blood in stool and melena.  Genitourinary: Negative for hematuria.  Musculoskeletal: Negative for myalgias and falls.  Neurological: Positive for dizziness (occasionally with positional). Negative for sensory change, speech change, focal weakness, seizures and loss of consciousness.  Endo/Heme/Allergies: Bruises/bleeds easily (Within a mild touch).  Psychiatric/Behavioral: Negative for depression. The patient is not nervous/anxious and does not have insomnia.   All other systems reviewed and are negative.   Current Outpatient Prescriptions on File Prior to Visit  Medication Sig Dispense Refill  . albuterol (PROVENTIL HFA;VENTOLIN HFA) 108 (90 BASE) MCG/ACT inhaler Inhale 2 puffs into the lungs every 4 (four) hours as needed for wheezing or shortness of breath. 1 Inhaler 4  . apixaban (ELIQUIS) 5 MG TABS tablet Take 1 tablet (5 mg total) by mouth 2 (two) times daily. 60 tablet 3  . Cyanocobalamin 1000 MCG TBCR Take 1,000 mcg by mouth daily.     [name] dexlansoprazole (DEXILANT) 60 MG capsule Take 60 mg by mouth daily.    . finasteride (PROSCAR) 5 MG tablet Take 5 mg by mouth daily.    . Fluticasone-Salmeterol (ADVAIR) 250-50 MCG/DOSE AEPB Inhale 1 puff into the lungs 2 (two) times daily.    . magnesium oxide (MAG-OX) 400 MG tablet Take 400 mg by mouth daily.    . metoprolol tartrate (LOPRESSOR) 25 MG tablet Take 25 mg by mouth 2 (two) times daily.     . nitroGLYCERIN (NITROSTAT) 0.4 MG SL tablet Place 1 tablet (0.4 mg total) under the tongue every 5 (five) minutes as needed for chest pain. 25 tablet 3  . rosuvastatin  (CRESTOR) 20 MG tablet Take 20 mg by mouth daily.    . tamsulosin (FLOMAX) 0.4 MG CAPS capsule Take 0.4 mg by mouth daily.    [name] ZETIA 10 MG tablet TAKE ONE TABLET BY MOUTH EVERY DAY 30 tablet 11   No current facility-administered medications on file prior to visit.   Allergies  Allergen Reactions  . Clonazepam Other (See Comments)    Altered mental status  . Codeine   . Niacin And Related     History  Substance Use Topics  . Smoking status: Former Smoker    Types:  Cigarettes    Quit date: 08/04/1997  . Smokeless tobacco: Never Used  . Alcohol Use: No   Family History  Problem Relation Age of Onset  . Ulcers Mother     Peptic  . Dementia Mother   . Alcohol abuse Father     Wt Readings from Last 3 Encounters:  12/13/14 224 lb 4 oz (101.719 kg)  11/14/14 214 lb (97.07 kg)  10/19/14 219 lb 9.6 oz (99.61 kg)    PHYSICAL EXAM BP 130/82 mmHg  Pulse 72  Ht 6' (1.829 m)  Wt 224 lb 4 oz (101.719 kg)  BMI 30.41 kg/m2 General appearance: A&O X 3, cooperative, appears stated age, no distress and Right healthy-appearing, well-nourished and well-groomed. Noticeably trimmed down. Very pleasant. Answers questions probably.  HEENT: [place]/AT, EOMI, MMM, anicteric sclera Neck: no adenopathy, no carotid bruit, no JVD and supple, symmetrical, trachea midline  Lungs: CTAB, normal percussion bilaterally and Nonlabored, good air movement  Heart: RRR, S1: normal, S2: physiologically split, no S3 or S4; 1/6 c-d/ Early peaking SEM at 2nd right IS-->to carotids and no rub  Abdomen: soft, non-tender; bowel sounds normal; no masses, no HSM; mild residual truncal obesity  Extremities: extremities normal, atraumatic, no cyanosis with at least 2+ bilateral lower edema from knees down.,Pulses: 2+ and symmetric ; diffuse mild scrapes and scars with various stages of healing although the upper and lower extremities Neurologic: normal strength and tone. Normal symmetric reflexes. Normal coordination  and gait   Adult ECG Report  Rate: 72 ;  Rhythm: normal sinus rhythm and Stable RBBB. Normal axis (9), intervals (PR interval 196 is borderline for first-degree AV block). Normal QT C. (4.9) normal voltage.  Narrative Interpretation: stable relatively normal EKG.  Recent Labs:  From [facility] on November 03, 2014  Sodium 138, potassium 4.4, chloride 104, bicarbonate 31, BUN 30, creatinine 1.29, glucose 116, calcium 9.1.  Most recent lipids not readily available  ASSESSMENT / PLAN: Problem List Items Addressed This Visit    Aorto-iliac disease (Chronic)    Followed by Dr. Oneida Alar      Relevant Medications   diltiazem (CARDIZEM CD) 120 MG 24 hr capsule   lisinopril (PRINIVIL,ZESTRIL) 2.5 MG tablet   furosemide (LASIX) 20 MG tablet   Atherosclerotic heart disease of native coronary artery without angina pectoris - s/p CABG, occluded SVG-D1; patent LIMA-LAD, SVG-OM, SVG- RCA (Chronic)    No active anginal symptoms. Able to exercise without issues. On stable regimen with statin and beta blocker. Now restarting ACE inhibitor. He is not on aspirin because of the normal course.      Relevant Medications   diltiazem (CARDIZEM CD) 120 MG 24 hr capsule   lisinopril (PRINIVIL,ZESTRIL) 2.5 MG tablet   furosemide (LASIX) 20 MG tablet   Dyspnea on exertion - -essentially resolved with BB dose reduction & wgt loss (Chronic)    He did better on increased dose of beta blocker. Atrial fibrillation which is somewhat. None finding is the beta blocker however is well controlled. We need to consider the use of a combination beta blocker as a standing medication with when necessary calcium channel blocker. Unfortunately standing calcium blockers led to significant edema.  Hopefully as he stabilizes out to get back into his routine exercise.      Essential hypertension (Chronic)    We had finally gotten his blood pressure regimen stable, and he is hospitalized and hasn't changed. Plan will be to restart the ACE  inhibitor for his coronary disease and hypertension. He  had been on 10 twice a day in the past I will start 2.5 twice a day this will help her the reduction. Finally he has not had any significant hypertensive episodes.      Relevant Medications   diltiazem (CARDIZEM CD) 120 MG 24 hr capsule   lisinopril (PRINIVIL,ZESTRIL) 2.5 MG tablet   furosemide (LASIX) 20 MG tablet   Hyperlipidemia with target LDL less than 70 (Chronic)    Monitored by PCP. He is on statin plus.      Relevant Medications   diltiazem (CARDIZEM CD) 120 MG 24 hr capsule   lisinopril (PRINIVIL,ZESTRIL) 2.5 MG tablet   furosemide (LASIX) 20 MG tablet   Lower extremity edema    A significant amount of pitting edema bilateral lower extremities.  Not associated with any other heart failure symptoms, so it's very difficult to blame this on a cardiac etiology.  Plan: Convert calcium channel blocker to a PRN dose for A. Fib RVR; prescribed low-dose Lasix to use when necessary.; Support stockings      OSA on CPAP (Chronic)    Currently on BiPAP with nighttime oxygen. Monitored by PCCM      PAD (peripheral artery disease) - bilateral common iliac stents (Chronic)    Recently seen by Dr. Oneida Alar. Was told that everything is stable with his carotid and iliac disease      Relevant Medications   diltiazem (CARDIZEM CD) 120 MG 24 hr capsule   lisinopril (PRINIVIL,ZESTRIL) 2.5 MG tablet   furosemide (LASIX) 20 MG tablet   PAF (paroxysmal atrial fibrillation) - Primary (Chronic)    Currently appear to be rate controlled. This is a relatively new diagnosis for him. He has been on beta blocker at stable dose that had to be reduced secondary to possible fracture. This. He was therefore started on calcium channel blocker in the hospital. Unfortunately with diltiazem he is noticing significant edema. I do think is probably related to diltiazem as he has no other heart failure symptoms to speak of.  Plan: Stop diltiazem. Use this as a  PRN for episodes of RVR. Continue beta blocker and restart ACE inhibitor for after the reduction.      Relevant Medications   diltiazem (CARDIZEM CD) 120 MG 24 hr capsule   lisinopril (PRINIVIL,ZESTRIL) 2.5 MG tablet   furosemide (LASIX) 20 MG tablet   Other Relevant Orders   EKG 12-Lead (Completed)   SOB (shortness of breath)   Relevant Orders   EKG 12-Lead (Completed)      Orders Placed This Encounter  Procedures  . EKG 12-Lead    Order Specific Question:  Where should this test be performed    Answer:  [facility]   Meds ordered this encounter  Medications  . Biotin 1000 MCG tablet    Sig: Take 1,000 mcg by mouth daily.  . Ascorbic Acid (VITAMIN C) 1000 MG tablet    Sig: Take 1,000 mg by mouth daily.  . Omega-3 Fatty Acids (FISH OIL PO)    Sig: Take 700 Units by mouth daily.  [name] diltiazem (CARDIZEM CD) 120 MG 24 hr capsule    Sig: Take 1 capsule (120 mg total) by mouth as needed (for Afib and heartrates greater than 110bpm).  [name] lisinopril (PRINIVIL,ZESTRIL) 2.5 MG tablet    Sig: Take 1 tablet (2.5 mg total) by mouth 2 (two) times daily.  . furosemide (LASIX) 20 MG tablet    Sig: Take 1 tablet (20 mg total) by mouth as needed (as needed for swelling).  Dispense:  30 tablet    Refill:  2    This is a difficult because of 2 hospitalizations in the interim since his last visit. There is a significant amount of data to review including stress tests and echocardiograms discharge summary and H&P's. I personally reviewed the stress test and echo. Results were appropriately answered and the past medical history. Well over 50 minutes was spent in combination with direct patient contact and chart work.  Patient instructions: 1) RESUME Lisinopril 2.83m twice daily 2) START Lasix 222mdaily as needed for swelling. An Rx has been sent to your pharmacy 3) TAKE Diltiazem 12047ms needed for fast heartrates >110bpm  Ok to Hold Eliquis for a dose if needed for excessive  bruising  Your physician recommends that you schedule a follow-up appointment in: 3 months wi  HARDING, DAVLeonie Green.D., M.S. Interventional Cardiologist   Pager # [phone]

## 2014-12-14 DIAGNOSIS — R0602 Shortness of breath: Secondary | ICD-10-CM | POA: Insufficient documentation

## 2014-12-14 DIAGNOSIS — R600 Localized edema: Secondary | ICD-10-CM | POA: Insufficient documentation

## 2014-12-14 NOTE — Assessment & Plan Note (Addendum)
Followed by Dr. Fields. 

## 2014-12-14 NOTE — Assessment & Plan Note (Signed)
Monitored by PCP. He is on statin plus.

## 2014-12-14 NOTE — Assessment & Plan Note (Addendum)
A significant amount of pitting edema bilateral lower extremities.  Not associated with any other heart failure symptoms, so it's very difficult to blame this on a cardiac etiology.  Plan: Convert calcium channel blocker to a PRN dose for A. Fib RVR; prescribed low-dose Lasix to use when necessary.; Support stockings

## 2014-12-14 NOTE — Assessment & Plan Note (Signed)
No active anginal symptoms. Able to exercise without issues. On stable regimen with statin and beta blocker. Now restarting ACE inhibitor. He is not on aspirin because of the normal course.

## 2014-12-14 NOTE — Assessment & Plan Note (Signed)
Recently seen by Dr. Oneida Alar. Was told that everything is stable with his carotid and iliac disease

## 2014-12-14 NOTE — Assessment & Plan Note (Signed)
He did better on increased dose of beta blocker. Atrial fibrillation which is somewhat. None finding is the beta blocker however is well controlled. We need to consider the use of a combination beta blocker as a standing medication with when necessary calcium channel blocker. Unfortunately standing calcium blockers led to significant edema.  Hopefully as he stabilizes out to get back into his routine exercise.

## 2014-12-14 NOTE — Assessment & Plan Note (Addendum)
Currently appear to be rate controlled. This is a relatively new diagnosis for him. He has been on beta blocker at stable dose that had to be reduced secondary to possible fracture. This. He was therefore started on calcium channel blocker in the hospital. Unfortunately with diltiazem he is noticing significant edema. I do think is probably related to diltiazem as he has no other heart failure symptoms to speak of.  Plan: Stop diltiazem. Use this as a PRN for episodes of RVR. Continue beta blocker and restart ACE inhibitor for after the reduction.

## 2014-12-14 NOTE — Assessment & Plan Note (Signed)
We had finally gotten his blood pressure regimen stable, and he is hospitalized and hasn't changed. Plan will be to restart the ACE inhibitor for his coronary disease and hypertension. He had been on 10 twice a day in the past I will start 2.5 twice a day this will help her the reduction. Finally he has not had any significant hypertensive episodes.

## 2014-12-14 NOTE — Assessment & Plan Note (Signed)
Currently on BiPAP with nighttime oxygen. Monitored by PCCM

## 2014-12-20 ENCOUNTER — Telehealth: Payer: Self-pay | Admitting: Internal Medicine

## 2014-12-20 MED ORDER — FLUTICASONE-SALMETEROL 250-50 MCG/DOSE IN AEPB: 1.0000 | INHALATION_SPRAY | Freq: Two times a day (BID) | RESPIRATORY_TRACT | Status: DC

## 2014-12-20 NOTE — Telephone Encounter (Signed)
Rx has been sent in. [name] is aware. Nothing further was needed.

## 2014-12-26 ENCOUNTER — Ambulatory Visit (INDEPENDENT_AMBULATORY_CARE_PROVIDER_SITE_OTHER): Payer: Medicare Other | Admitting: Internal Medicine

## 2014-12-26 ENCOUNTER — Other Ambulatory Visit: Payer: Self-pay | Admitting: *Deleted

## 2014-12-26 VITALS — BP 124/74 | HR 78 | Ht 72.0 in | Wt 223.0 lb

## 2014-12-26 DIAGNOSIS — J849 Interstitial pulmonary disease, unspecified: Secondary | ICD-10-CM

## 2014-12-26 DIAGNOSIS — Z9989 Dependence on other enabling machines and devices: Secondary | ICD-10-CM

## 2014-12-26 DIAGNOSIS — G4733 Obstructive sleep apnea (adult) (pediatric): Secondary | ICD-10-CM | POA: Diagnosis not present

## 2014-12-26 DIAGNOSIS — I6523 Occlusion and stenosis of bilateral carotid arteries: Secondary | ICD-10-CM

## 2014-12-26 DIAGNOSIS — J431 Panlobular emphysema: Secondary | ICD-10-CM | POA: Diagnosis not present

## 2014-12-26 LAB — PULMONARY FUNCTION TEST
DL/VA % pred: 44 %
DL/VA: 2.09 ml/min/mmHg/L
DLCO unc % pred: 37 %
DLCO unc: 13.24 ml/min/mmHg
FEF 25-75 Post: 3.12 L/sec
FEF 25-75 Pre: 3.28 L/sec
FEF2575-%Change-Post: -4 %
FEF2575-%Pred-Post: 141 %
FEF2575-%Pred-Pre: 149 %
FEV1-%Change-Post: 0 %
FEV1-%Pred-Post: 103 %
FEV1-%Pred-Pre: 103 %
FEV1-Post: 3.26 L
FEV1-Pre: 3.26 L
FEV1FVC-%Change-Post: 0 %
FEV1FVC-%Pred-Pre: 111 %
FEV6-%Change-Post: 0 %
FEV6-%Pred-Post: 98 %
FEV6-%Pred-Pre: 98 %
FEV6-Post: 4.08 L
FEV6-Pre: 4.05 L
FEV6FVC-%Change-Post: 0 %
FEV6FVC-%Pred-Post: 105 %
FEV6FVC-%Pred-Pre: 105 %
FVC-%Change-Post: 0 %
FVC-%Pred-Post: 93 %
FVC-%Pred-Pre: 92 %
FVC-Post: 4.1 L
FVC-Pre: 4.07 L
Post FEV1/FVC ratio: 79 %
Post FEV6/FVC ratio: 100 %
Pre FEV1/FVC ratio: 80 %
Pre FEV6/FVC Ratio: 100 %
RV % pred: 58 %
RV: 1.6 L
TLC % pred: 76 %
TLC: 5.71 L

## 2014-12-26 NOTE — Progress Notes (Signed)
MRN# [id number] [name] Aug 28, 1934   HM:[id number] of my breathing tests and COPD. Chief Complaint  Patient presents with  . Follow-up    PFT/Sleep results;       Brief History: HPI 11/14/14 Patient is a pleasant 81 year old male with a past medical history of hypertension, hyperlipidemia, obstructive sleep apnea on CPAP(not using), coronary artery disease who is seen today for hospital followup of acute respiratory failure. Detailed history as outlined hospital summarization below. Briefly, one week prior to admission patient started having gradual onset of shortness of breath, and confusion, he was seen at the emergency room initially for difficulty with urination, and was discharged from the emergency room at that time. The week progresses confusion shortness of breath continued and he represented to the emergency room chest x-ray showed probable pneumonia at that time. Since discharge patient states that he's been doing well his breathing is back to baseline, and he is back to attending the gym. Of note 1 month prior to admission he was in his usual state of health he attends a gym daily, he has a girlfriend that accompanies him. Further history reveals that he had upper respiratory tract infection urine December, was diagnosed with bronchitis, this cleared, one week prior to hospitalization he started having wheezing initially was thought to have a UTI, but as stated above this was negative, and confusion ensued along with hypoxia leading to admission to the hospital for approximately one week.  Today patient states that he is doing well he did not endorse any worsening shortness of breath, he actually states his shortness of breath is improving, he does have a history of obstructive sleep apnea and was probably on CPAP but has not been wearing CPAP. He was diagnosed with OSA many years ago at sleep med. He denies any drug use, previously had dogs at home, previously employed in [employer]  work, he does have a significant tobacco history previously smoked one to 2 packs per day for 32 years and quit a number of years ago.  Upon discharge from the hospital he was discharged on Advair, 3 L of oxygen continuously even at night and advised to followup to determine his outpatient oxygen needs. During his visit today he had a 6 minute walk test that showed lowest saturation on room air to 89%, he walked 936 feet, his highest heart rate was 108.  Patient also stated that he is currently on BiPAP, but his hospital discharge states CPAP. Plan: PFTs and HRCT, continue with inhalers   Events since last clinic visit: Patient presents today for a follow up visit.  Wearing cpap machine every night. No worsening shortness of breath. Had a repeat titration study done. Started back exercising.   PMHX:   Past Medical History  Diagnosis Date  . Atherosclerotic heart disease of native coronary artery without angina pectoris 1998    Multivessel disease on cath for angina: S/p CABG x 4 (LIMA-LAD, SVG-RCA, SVG-OM, SVG-DIAG); SVG-Diag occluded on cath 1999  . Atherosclerotic heart disease of artery bypass graft 1999    a) Cardiac Cath : Occluded SVG-D1;; ostial LM 80-90%, diffuse LAD dz w/ D1 &2 subtotalled, LCx 70% prox with competitive flow from SVG-OM, RCA 100% occluded; patent LIMA-LAD, LC-RCA and SVG-OM; b) TM Myoview 10/2010: Ex 6.5 min - 9 METS, HR to 149, No Ischemia or infarct, PVCs; c) Echo 10/2010: EF >55%, mild AoSclerosis, Conc Remodelling.; Myoview 2/ 2016: EF 55%. Low   risk. No ischemia.  . Carotid artery occlusion without  infarction 06/2006    a) High Grade L Carotid Stenosis on Doppler -> s/x L CEA; b) Carotid Doppler 10/2013:: Left CEA patent, R. ICA 60-70% stenosis  . 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  . Aortoiliac occlusive disease April 2007    Status post bilateral common iliac artery stenting  . Ischemic heart disease with chronotropic incompetence     Seen on CPET to evaluate Exertional Dyspnea. Only able to achieve 80% of peak heart rate; peak VO2 was 75% - but possibly underestimated.  . Hyperlipidemia with target LDL less than 70   . Essential hypertension   . OSA on CPAP   . Erectile dysfunction due to arterial insufficiency   . Sciatic pain     Chronic  . GERD (gastroesophageal reflux disease)   . PAF (paroxysmal atrial fibrillation) February 2016    Anticoagulated with Eliquis. On combination beta blocker and calcium channel blocker  . Colon polyps   . TIA (transient ischemic attack)   . Insomnia   . Memory loss   . Lichen planus    Surgical Hx:  Past Surgical History  Procedure Laterality Date  . Coronary artery bypass graft  1998    LIMA-LAD, SVG-OM, SVG-RPDA, SVG-DIAG  . Cardiac catheterization  01/30/1998      total native LAD andRCA ,mod native CIRC; svg to RCA and OBTUSE MARG. PATENT the left internal mammary artery graft to LAD ; LV NORMAL  /  . Doppler echocardiography  11/20/2010    EF =>55%; LV norm mild aortic scelorosis  . Nm myocar perf wall motion  11/11/2010; February 2016    a) TM STRESS----NORMAL PERFUSION ,EF 68%; b) EF 50-55%. Normal LV function. No significant ischemia or infarction.  [name] Cpet / met  12/2012    Mild chronotropic incompetence - read 82% of predicted; also reduced effort; peak VO2 15.7 / 75% (did not reach Max effort) -- suggested ischemic response in last 1.5 minutes of exercise. Normal pulmonary function on PFTs but poor response to  . Arch aortogram and carotid aortogram  06/02/2006    Dr Oneida Alar did surgery  . Iliac artery stent  12/15/2005    PTA and direct stenting rgt and lft common iliac arteries  . Thoracic aorta - carotid angiogram  October 2007    Dr. 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 Carotid endarterectomy Left Oct. 29, 2007    Dr. Oneida Alar:  . Ankle surgery    . Appendectomy    . Hiatal hernia repair    . Transthoracic echocardiogram  February 2016    [facility]: Normal LV function. Dilated left atrium.   Family Hx:  Family History  Problem Relation Age of Onset  . Ulcers Mother     Peptic  . Dementia Mother   . Alcohol abuse Father    Social Hx:   History  Substance Use Topics  . Smoking status: Former Smoker    Types: Cigarettes    Quit date: 08/04/1997  . Smokeless tobacco: Never Used  . Alcohol Use: No   Medication:   Current Outpatient Rx  Name  Route  Sig  Dispense  Refill  . albuterol (PROVENTIL HFA;VENTOLIN HFA) 108 (90 BASE) MCG/ACT inhaler   Inhalation   Inhale 2  puffs into the lungs every 4 (four) hours as needed for wheezing or shortness of breath.   1 Inhaler   4   . apixaban (ELIQUIS) 5 MG TABS tablet   Oral   Take 1 tablet (5 mg total) by mouth 2 (two) times daily.   60 tablet   3   . Ascorbic Acid (VITAMIN C) 1000 MG tablet   Oral   Take 1,000 mg by mouth daily.         . Biotin 1000 MCG tablet   Oral   Take 1,000 mcg by mouth daily.         . Cyanocobalamin 1000 MCG TBCR   Oral   Take 1,000 mcg by mouth daily.          [name] dexlansoprazole (DEXILANT) 60 MG capsule   Oral   Take 60 mg by mouth daily.         . finasteride (PROSCAR) 5 MG tablet   Oral   Take 5 mg by mouth daily.         . Fluticasone-Salmeterol (ADVAIR) 250-50 MCG/DOSE AEPB   Inhalation   Inhale 1 puff into the lungs 2 (two) times daily.   60 each   5   . furosemide (LASIX) 20 MG tablet   Oral   Take 1 tablet (20 mg total) by mouth as needed (as needed for swelling).   30 tablet   2   . lisinopril (PRINIVIL,ZESTRIL) 2.5 MG tablet   Oral   Take 1 tablet (2.5 mg total) by mouth 2 (two) times daily.         . magnesium oxide (MAG-OX) 400 MG tablet   Oral   Take 400 mg  by mouth daily.         . metoprolol tartrate (LOPRESSOR) 25 MG tablet   Oral   Take 25 mg by mouth 2 (two) times daily.          . nitroGLYCERIN (NITROSTAT) 0.4 MG SL tablet   Sublingual   Place 1 tablet (0.4 mg total) under the tongue every 5 (five) minutes as needed for chest pain.   25 tablet   3   . Omega-3 Fatty Acids (FISH OIL PO)   Oral   Take 700 Units by mouth daily.         . rosuvastatin (CRESTOR) 20 MG tablet   Oral   Take 20 mg by mouth daily.         . tamsulosin (FLOMAX) 0.4 MG CAPS capsule   Oral   Take 0.4 mg by mouth daily.         [name] ZETIA 10 MG tablet      TAKE ONE TABLET BY MOUTH EVERY DAY   30 tablet   11      Review of Systems: Gen:  Denies  fever, sweats, chills HEENT: Denies blurred vision, double vision, ear pain, eye pain, hearing loss, nose bleeds, sore throat Cvc:  No dizziness, chest pain or heaviness Resp:   Denies cough or sputum porduction, shortness of breath Gi: Denies swallowing difficulty, stomach pain, nausea or vomiting, diarrhea, constipation, bowel incontinence Gu:  Denies bladder incontinence, burning urine Ext:   No Joint pain, stiffness or swelling Skin: No skin rash, easy bruising or bleeding or hives Endoc:  No polyuria, polydipsia , polyphagia or weight change Psych: No depression, insomnia or hallucinations  Other:  All other systems negative  Allergies:  Clonazepam; Codeine; and Niacin and related  Physical Examination:  VS: BP 124/74 mmHg  Pulse 78  Ht 6' (1.829 m)  Wt 223 lb (101.152 kg)  BMI 30.24 kg/m2  SpO2 94%  General Appearance: No distress  HEENT: PERRLA, EOM intact, no ptosis, no other lesions noticed Pulmonary:Exam: good respiratory effort, fine crackles at the bases (R>L) Cardiovascular:@ Exam:  Normal S1,S2.  No m/r/g.     Abdomen:Exam: Benign, Soft, non-tender, No masses  Skin:   warm, no rashes, no ecchymosis  Extremities: normal, no cyanosis, clubbing, no edema, warm with normal  capillary refill.   Labs results:  BMP Lab Results  Component Value Date   NA 139 10/03/2014   K 4.8 10/03/2014   CL 100 10/03/2014   CO2 22 10/03/2014   GLUCOSE 104* 10/03/2014   BUN 26 10/03/2014   CREATININE 1.17 10/03/2014     CBC CBC Latest Ref Rng 10/01/2012 11/14/2008  WBC 3.8-10.6 x10 3/mm 3 16.0(H) -  Hemoglobin 13.0-18.0 g/dL 18.5(H) 16.7  Hematocrit 40.0-52.0 % 56.3(H) 49.0  Platelets 150-440 x10 3/mm 3 171 -     Rad results: (The following images and results were reviewed by Dr. Stevenson Clinch). Pulmonary function testing 12/26/2014 FEC 92 % FEV1 1.3 % FEV1/FVC 80% RV 50% TLC 76% ERV 122% DLCO 37% Impression: no significant obstruction, there is some mild possible borderline obstruction. Decrease RV along with TLC suggests a restrictive component, severe-very severe decrease in DLCO is noted which can be seen in patients with COPD along with obstructive sleep apnea.  Overnight pulse oximetry testing 11/20/2014: Patient below 85% for 5.1% of the time, will start 2 L supplemental       Assessment and Plan:81 year old male presenting today for followup visit of COPD/OSA. OSA on CPAP OSA  Discussed sleep data and reviewed with patient.  Encouraged proper weight management.  Excessive weight may contribute to snoring.  Monitor sedative use.  Discussed driving precautions and its relationship with hypersomnolence.  Discussed operating dangerous equipment and its relationship with hypersomnolence.  Discussed sleep hygiene, and benefits of a fixed sleep waked time.  The importance of getting eight or more hours of sleep discussed with patient.  Discussed limiting the use of the computer and television before bedtime.  Decrease naps during the day, so night time sleep will become enhanced.  Limit caffeine, and sleep deprivation.  HTN, stroke, and heart failure are potential risk factors.     Plan: CPAP 12 cm of water, 7 nights per week, minimum 6 hours per  night. Previously on BiPAP, now switched over based on new split study back to CPAP.         COPD (chronic obstructive pulmonary disease) Patient of prolonged history of smoking in the past however quit a number of years ago, currently on Advair 250/50.  Plan: -Continue Advair -Continue with tobacco cessation -Continue withdiet and exercise -continue using NIVPPV (non-invasive positive pressure ventilation, CPAP) for OSA. -O2 at night, 2L  Based on results for split night study and ONO     ILD (interstitial lung disease) Patient with recent hospitalization, CT scan during this hospitalization showed chronic interstitial processes and peripheral groundglass opacities.  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 He does have a history of COPD and is currently on Advair to 250/50. These peripheral groundglass opacities will be further evaluated with a high-resolution CT of chest in 3 months.  Plan: -Continue with COPD optimization, continue with OSA optimization. -High-resolution CT chest without contrast scheduled for 02/21/2015 8:45 AM.  Updated Medication List Outpatient Encounter Prescriptions as of 12/26/2014  Medication Sig  . albuterol (PROVENTIL HFA;VENTOLIN HFA) 108 (90 BASE) MCG/ACT inhaler Inhale 2 puffs into the lungs every 4 (four) hours as needed for wheezing or shortness of breath.  [name] apixaban (ELIQUIS) 5 MG TABS tablet Take 1 tablet (5 mg total) by mouth 2 (two) times daily.  . Ascorbic Acid (VITAMIN C) 1000 MG tablet Take 1,000 mg by mouth daily.  . Biotin 1000 MCG tablet Take 1,000 mcg by mouth daily.  . Cyanocobalamin 1000 MCG TBCR Take 1,000 mcg by mouth daily.   [name] dexlansoprazole (DEXILANT) 60 MG capsule Take 60 mg by mouth daily.  . finasteride (PROSCAR) 5 MG tablet Take 5 mg by mouth daily.  . Fluticasone-Salmeterol (ADVAIR) 250-50 MCG/DOSE AEPB Inhale 1 puff into the lungs 2 (two) times daily.  .  furosemide (LASIX) 20 MG tablet Take 1 tablet (20 mg total) by mouth as needed (as needed for swelling).  [name] lisinopril (PRINIVIL,ZESTRIL) 2.5 MG tablet Take 1 tablet (2.5 mg total) by mouth 2 (two) times daily.  . magnesium oxide (MAG-OX) 400 MG tablet Take 400 mg by mouth daily.  . metoprolol tartrate (LOPRESSOR) 25 MG tablet Take 25 mg by mouth 2 (two) times daily.   . nitroGLYCERIN (NITROSTAT) 0.4 MG SL tablet Place 1 tablet (0.4 mg total) under the tongue every 5 (five) minutes as needed for chest pain.  . Omega-3 Fatty Acids (FISH OIL PO) Take 700 Units by mouth daily.  . rosuvastatin (CRESTOR) 20 MG tablet Take 20 mg by mouth daily.  . tamsulosin (FLOMAX) 0.4 MG CAPS capsule Take 0.4 mg by mouth daily.  [name] ZETIA 10 MG tablet TAKE ONE TABLET BY MOUTH EVERY DAY  . [DISCONTINUED] diltiazem (CARDIZEM CD) 120 MG 24 hr capsule Take 1 capsule (120 mg total) by mouth as needed (for Afib and heartrates greater than 110bpm). (Patient not taking: Reported on 12/26/2014)    Orders for this visit: Orders Placed This Encounter  Procedures  . CT Chest High Resolution    Standing Status: Future     Number of Occurrences: 1     Standing Expiration Date: 02/25/2016    Scheduling Instructions:     Schedule mid June     Dr. Dessie Coma or Rosario Jacks to read    Order Specific Question:  Reason for Exam (SYMPTOM  OR DIAGNOSIS REQUIRED)    Answer:  question ILD    Order Specific Question:  Preferred imaging location?    Answer:  [facility]  . Ambulatory Referral for DME    Referral Priority:  Routine    Referral Type:  Durable Medical Equipment Purchase    Number of Visits Requested:  1    Thank  you for the visitation and for allowing  [facility] to assist in the care of your patient. Our recommendations are noted above.  Please contact us if we can be of further service.  Vilinda Boehringer, MD [facility] Office Number: [phone]

## 2014-12-26 NOTE — Progress Notes (Signed)
PFT performed today. 

## 2014-12-26 NOTE — Patient Instructions (Addendum)
Follow up with Dr. Stevenson Clinch in 2 months - for your OSA, we will adjust your settings to CPAP 12 cm H2O - cont with exercise  - Supplemental Oxgyen - 2L with sleep only - continue with inhalers as directed.  - HRCT prior to next visit for possible ILD as discussed - appointment on 02/21/15 at 8.45am

## 2014-12-27 NOTE — Assessment & Plan Note (Signed)
OSA  Discussed sleep data and reviewed with patient.  Encouraged proper weight management.  Excessive weight may contribute to snoring.  Monitor sedative use.  Discussed driving precautions and its relationship with hypersomnolence.  Discussed operating dangerous equipment and its relationship with hypersomnolence.  Discussed sleep hygiene, and benefits of a fixed sleep waked time.  The importance of getting eight or more hours of sleep discussed with patient.  Discussed limiting the use of the computer and television before bedtime.  Decrease naps during the day, so night time sleep will become enhanced.  Limit caffeine, and sleep deprivation.  HTN, stroke, and heart failure are potential risk factors.     Plan: CPAP 12 cm of water, 7 nights per week, minimum 6 hours per night. Previously on BiPAP, now switched over based on new split study back to CPAP.

## 2014-12-27 NOTE — Assessment & Plan Note (Signed)
Patient of prolonged history of smoking in the past however quit a number of years ago, currently on Advair 250/50.  Plan: -Continue Advair -Continue with tobacco cessation -Continue withdiet and exercise -continue using NIVPPV (non-invasive positive pressure ventilation, CPAP) for OSA. -O2 at night, 2L  Based on results for split night study and ONO

## 2014-12-27 NOTE — Assessment & Plan Note (Signed)
Patient with recent hospitalization, CT scan during this hospitalization showed chronic interstitial processes and peripheral groundglass opacities.  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 He does have a history of COPD and is currently on Advair to 250/50. These peripheral groundglass opacities will be further evaluated with a high-resolution CT of chest in 3 months.  Plan: -Continue with COPD optimization, continue with OSA optimization. -High-resolution CT chest without contrast scheduled for 02/21/2015 8:45 AM.

## 2014-12-31 NOTE — H&P (Signed)
PATIENT NAME:  [name], [name] MR#:  [id number] DATE OF BIRTH:  [birth year]  DATE OF ADMISSION:  10/28/2014  PRIMARY CARE PHYSICIAN: Jerrell Belfast, MD   REFERRING PHYSICIAN: Baird Cancer. Quale, MD   CHIEF COMPLAINT: Shortness of breath and confusion for 1 week.   HISTORY OF PRESENT ILLNESS: A 81 year old Caucasian male with a history of hypertension, hyperlipidemia, and coronary artery disease, who was sent to ED due to shortness of breath and confusion for the past 1 week. The patient is alert, awake, oriented, x 3, but looks confused.   According to the patient's wife and daughter, the patient was sent to the ED 1 week ago due to urinary dripping and difficulty with urination. A work-up for urinary tract infection was negative, and the patient was sent back home. After going home, his urine symptoms improved , but the patient started to have shortness of breath, weakness, and became confused. In addition, the patient also has a low-grade fever with chills, but the patient denies any abdominal pain, nausea, vomiting, or diarrhea. No cough or sputum. No chest pain. No palpitations. The patient was found to have hypoxia with oxygen saturation 88% in the ED and was treated with oxygen.   PAST MEDICAL HISTORY: Hypertension, CAD, hyperlipidemia., left CVA, PVD, obstructive sleep apnea, GERD, BPH, recent diagnosis of paroxysmal atrial fibrillation on Eliquis, but back to sinus rhythm   SOCIAL HISTORY: Quit smoking many years ago. No alcohol drinking or illicit drugs.   FAMILY HISTORY: Father had heart trouble. Her mother had a stomach issue.   PAST SURGICAL HISTORY:  CABG, left ankle surgery, carotid endarterectomy.   ALLERGIES: CODEINE AND NIACIN.   HOME MEDICATIONS: Zetia 10 mg p.o. in the evening, Flomax 0.4 mg p.o. in the evening, Lopressor 25 mg p.o. b.i.d., magnesium oxide 400 mg p.o. in the evening, finasteride 5 mg p.o. daily, Eliquis 5 mg p.o. b.i.d., Cardizem 120 mg p.o. daily, Dexilant 60  mg p.o. daily, Crestor 20 mg p.o. daily in the evening.   REVIEW OF SYSTEMS:  CONSTITUTIONAL: The patient has a fever, chills, headache, dizziness, and weakness.  EYES: No double vision or blurred vision. EARS, NOSE, AND THROAT: No postnasal drip, slurred speech, or dysphagia.  CARDIOVASCULAR: No chest pain, palpitation. No orthopnea or nocturnal dyspnea. No leg edema.  PULMONARY: No cough, sputum, but has shortness of breath. No hemoptysis. GASTROINTESTINAL: No abdominal pain, nausea, vomiting, or diarrhea. No melena or bloody stool.  GENITOURINARY: No dysuria, hematuria, or incontinence.  SKIN: No rash or jaundice.  NEUROLOGY: No syncope, loss of consciousness, or seizure, but has confusion.  ENDOCRINE: No polyuria, polydipsia, heat or cold intolerance.  HEMATOLOGY: No easy bruising or bleeding.   PHYSICAL EXAMINATION: VITAL SIGNS: Temperature 98.8, blood pressure 134/88, pulse 98, oxygen saturation 98% on oxygen.  GENERAL: The patient is alert, awake, oriented x 3, but is a little confused and unable to provide detailed information.  HEENT: Pupils round, equal, reactive to light and accommodation. Dry oral mucosa. Clear oropharynx.  NECK: Supple. No JVD or carotid bruit. No lymphadenopathy, no thyromegaly.  CARDIOVASCULAR: S1 and S2, regular rate and rhythm. No murmurs or gallops.  PULMONARY: Bilateral air entry. No wheezing, but has some crackles or rales on the right. Left side is clear. No use of accessory muscle to breathe.  ABDOMEN: Soft. No distention, no tenderness, no organomegaly. Bowel sounds present.  EXTREMITIES: Bilateral lower extremity trace edema. No clubbing or cyanosis. No calf tenderness. Bilateral pedal pulses present.  SKIN: No  rash or jaundice.  NEUROLOGIC: Alert and oriented x 3, follow commands. No focal deficit. Power 5/5. Sensation intact.   LABORATORY DATA: Troponin less than 0.02. INR 1.7, glucose 98, BUN 15, creatinine 1.21. Electrolytes normal. CBC in  normal range. BNP 146. CAT scan of head: Mild diffuse cortical atrophy. No acute intracranial abnormality. Chest x-ray: Chronic changes without acute abnormality. The patient had a stress test like 3 days ago, which was normal. The patient's echocardiograph last month was normal with ejection of 60%. Urinalysis, several days ago, was negative.   IMPRESSIONS: 1. Acute respiratory failure with hypoxia, unclear etiology. Need to rule out congestive heart failure.  2. Confusion with altered mental status, unclear etiology.  3. Hypertension.  4. Coronary artery disease.  5. History of atrial fibrillation. 6. Peripheral vascular disease.  7. Coronary artery disease.  8. Benign prostatic hypertrophy.   PLAN OF TREATMENT:  1. The patient will be admitted to medical floor. We will continue oxygen by nasal cannula. Give Lasix IV b.i.d. and get a CT angiogram of chest to rule out PE. In addition, we will give nebulizer p.r.n.  2. For confusion: Neurological exam did not show any focal deficit. I will get an MRI of the brain to rule out acute cerebrovascular accident. In addition, I will continue Eliquis, Cardizem, and  lisinopril. I will also continue Lopressor.  3. For benign prostatic hypertrophy. Continue the patient's home medications.  4. I discussed the patient's condition and plan of treatment with the patient. According to the patient's daughter, who is the health power of attorney, and the patient's wife, the patient's code status is DO NO RESUSCITATE.   TIME SPENT: About 68 minutes     ____________________________ Demetrios Loll, MD qc:mw D: 10/28/2014 16:13:07 ET T: 10/28/2014 16:43:45 ET JOB#: [id number]  cc: Demetrios Loll, MD, <Dictator> Demetrios Loll MD ELECTRONICALLY SIGNED 10/28/2014 21:52

## 2014-12-31 NOTE — Consult Note (Signed)
PATIENT NAME:  [name], [name] MR#:  [id number] DATE OF BIRTH:  10/14/1933  DATE OF CONSULTATION:  11/02/2014  REFERRING PHYSICIAN:   CONSULTING PHYSICIAN:  Gonzella Lex, MD  IDENTIFYING INFORMATION AND REASON FOR CONSULT: This is a 81 year old man with no significant psychiatric history. Consult because of confusion and altered mental status yesterday.   HISTORY OF PRESENT ILLNESS: Information from the chart and the patient and the patient's girlfriend. The patient tells me today, he is feeling much better. Apparently yesterday he was quite confused and delirious in a way that was remarkably unlike his normal baseline. Today he feels a lot better. He evidently slept with his CPAP machine last night, which he normally avoids doing. Today, he has no real acute symptoms. Mood is feeling good. Not depressed. He does not feel like he is having any memory problems or any confusion. He still occasionally has some word finding areas or a little bit of slippage of his memory but it does not sound like it is really anything out of the ordinary. No suicidal ideation. No psychotic symptoms. No real complaints right now.   PAST PSYCHIATRIC HISTORY: No past psychiatric history other than having had some brief marital counseling many years ago; never been on any medication, never been hospitalized.   SOCIAL HISTORY: The patient is retired. He lives alone, but has a long term girlfriend with whom he has a good relationship; seems to have a pretty active social and physical life when he is feeling well.   PAST MEDICAL HISTORY: The patient came into the hospital with altered mental state, respiratory failure, coronary artery disease, atrial fibrillation, some renal failure.   FAMILY HISTORY: No significant family history of mental illness.   SUBSTANCE ABUSE HISTORY: Does not drink regularly at all, denies any past history of alcohol or drug abuse.   CURRENT MEDICATIONS: Diltiazem 120 mg per day, Zetia 10 mg  at night, Proscar 5 mg at night, magnesium 400 mg a day, metoprolol 25 mg twice a day, pantoprazole 40 mg in the morning, Crestor 20 mg at night, tamsulosin 0.4 mg daily, Eliquis 5 mg twice a day, Advair Diskus inhalation twice a day.   ALLERGIES: CODEINE AND NIACIN.     REVIEW OF SYSTEMS: Currently denies any psychiatric symptoms, no depression, no hallucinations; no suicidal ideation, still a little bit short of breath and weak but getting better.   MENTAL STATUS EXAMINATION: Neatly groomed gentleman looks younger than his stated age; very pleasant, cheerful and talkative. Good eye contact, normal psychomotor activity. Speech is normal in rate, tone, and volume. Speaks in full sentences and complete thoughts; the patient is lucid with there being no evidence of delusions or loosening of associations; denies auditory or visual hallucinations. Denies suicidal or homicidal ideation; patient can repeat 3 words and remembers all 3 of them at 3 minutes. His judgment and insight appear to be intact. He is alert and oriented x4.   LABORATORY RESULTS: Most recently today, his glucose is a little up at 127, creatinine is 1.37, BUN 30, still running an elevated white count 7.5, rest of the CBC not really remarkable, pO2 low at 57, pCO2 normal at 34.   VITAL SIGNS: Blood pressure 110/68, respirations 20, pulse 73, temperature 97.6.   ASSESSMENT: This is a 81 year old man with no past psychiatric history who was apparently delirious yesterday. He is not at all delirious today and there is no sign of significant dementia. Presumably delirium was due to hypoxia and possibly other  medical complications which are resolving. No need for any psychiatric treatment.   TREATMENT PLAN: The patient and his girlfriend educated about the risks of delirium in patients with chronic medical problems when they get sick. He was strongly encouraged to consider being more compliant with his CPAP machine at home, and to take  seriously any recommendations about home oxygen. No indication for any psychiatric medicine.   DIAGNOSIS, PRINCIPAL AND PRIMARY:   AXIS I: Delirium from hypoxia resolved.   SECONDARY DIAGNOSES:  AXIS I:  None.    AXIS II: None.   AXIS III: Multiple medical problems as noted above.    ____________________________ Gonzella Lex, MD jtc:nt D: 11/02/2014 18:35:00 ET T: 11/02/2014 18:57:02 ET JOB#: [id number]  cc: Gonzella Lex, MD, <Dictator> Gonzella Lex MD ELECTRONICALLY SIGNED 11/07/2014 10:40

## 2014-12-31 NOTE — H&P (Signed)
PATIENT NAME:  [name], [name] MR#:  [id number] DATE OF BIRTH:  [birth year]  DATE OF ADMISSION:  09/27/2014  PRIMARY CARE PHYSICIAN: Dr. Margarita Rana.     PRIMARY CARDIOLOGIST:  Dr. Ellyn Hack with [facility] medical cardiology group.   REFERRING PHYSICIAN:  Dr. Rockey Situ.     CHIEF COMPLAINT:  Atrial fibrillation.    HISTORY OF PRESENT ILLNESS: A 81 year old male who has a history of hypertension, benign prostatic hypertrophy, hyperlipidemia, carotid artery surgery, coronary artery bypass graft surgery, and some stent in aorta which was 16 years ago, had difficult to control hypertension for the last few months and so his cardiologist referred him to have a stress test done, which he was scheduled as outpatient today. When he arrived for stress test he was noted by the nurse to have tachycardia up to 170 heart rate and so they canceled the stress test, gave him injection of Cardizem which helped to slow down his heart rate, but because of that cardiologist decided to admit him to telemetry and contacted the hospitalist service for that.  On my questioning to patient he denies any complaints. He did not feel any chest pain, palpitation, or shortness of breath or any dizziness, and he never had any chest pain in the past.  He goes to gym and does some exercise every day and very active for his age.   REVIEW OF SYSTEMS:    CONSTITUTIONAL: Negative for fever, fatigue, weakness, pain, or weight loss.  EYES: No blurring or double vision, discharge, or redness.  EARS, NOSE, THROAT: No tinnitus, ear pain, or hearing loss.  RESPIRATORY: No cough, wheezing, hemoptysis, or shortness of breath.  CARDIOVASCULAR: No chest pain, orthopnea, edema, arrhythmia, or palpitations.  GASTROINTESTINAL: No nausea, vomiting, diarrhea, abdominal pain.  GENITOURINARY: No dysuria, hematuria, increased frequency.  ENDOCRINE: No heat or cold intolerance. No excessive sweating.  SKIN: No acne, rashes, or lesions.   MUSCULOSKELETAL: No pain or swelling in the joints.  NEUROLOGICAL: No numbness, weakness, tremor, or vertigo.  PSYCHIATRIC: No anxiety, insomnia, bipolar disorder.   PAST MEDICAL HISTORY:  1. Hypertension.  2. Benign prostatic hypertrophy.  3. Hyperlipidemia.  4. Carotid artery surgery.   5. CABG 16 years ago and stent in the aorta.    SOCIAL HISTORY: The patient quit smoking many years ago. Denies alcohol abuse and drug abuse. He is retired from [employer].   FAMILY HISTORY:  Positive, father had some heart trouble, he does not know exactly what, but did not have heart attack. Mother had some stomach issues and also then had to remove part of her stomach and intestine and she died in complications of that.   HOME MEDICATIONS:  1. Zetia 10 mg oral tablet once a day.  2. Vitamin D3 400 mg oral once a day.  3. Probiotic once a day.  4. Metoprolol 25 mg oral.  5. Lisinopril 10 mg oral 2 times a day.  6. Fish oil 1000 mg oral once a day.  7. Finasteride 5 mg oral once a day.  8. Crestor 20 mg at night.  9. 81 mg aspirin once a day.   PHYSICAL EXAMINATION:  VITAL SIGNS: Temperature 97.5, pulse is 108, respirations 18, blood pressure 142/86, and pulse oximetry is 93 on room air.  GENERAL: The patient is fully alert and oriented to time, place, and person. Does not appear in any acute distress.  HEENT: Head and neck atraumatic. Conjunctivae pink. Oral mucosa moist.  NECK: Supple. No JVD.  RESPIRATORY: Bilateral  equal and clear air entry.  CARDIOVASCULAR: S1, S2 present, regular. No murmur.  ABDOMEN: Soft, nontender. Bowel sounds present. No organomegaly felt.  SKIN: No acne, rashes, or lesions.  MUSCULOSKELETAL: No tenderness or swelling in the joints.  LEGS: No edema.  NEUROLOGICAL: Power 5 out of 5. Follows commands. No tremor or rigidity. Sensation is intact.  PSYCHIATRIC: Does not appear in any acute psychiatric illness at this time.   IMPORTANT LABORATORY RESULTS:  1.   Glucose 102, BUN 16, creatinine 1.17, sodium 140, potassium 3.9, chloride 107, CO2 23, calcium is 8.9, magnesium 1.7.  2.  Total protein 6.7, albumin 3.2, bilirubin 1.7, alkaline phosphate 70, SGOT 27, SGPT 28.  3.  TSH is 1.06.  4.  WBC 10.3, hemoglobin 15.4, platelet count 150,000, and MCV is 94.  5.  INR is 1.1.  6.  Chest x-ray PA and lateral, no acute cardiopulmonary abnormalities seen.   ASSESSMENT AND PLAN: A 81 year old man who came in as outpatient procedure for a nuclear stress test, but was noted to have atrial fibrillation and tachycardia up to 170, so after Cardizem injection and conversion to a normal sinus rhythm admitted to telemetry for further management   1.  Atrial fibrillation with rapid ventricular response. He responded to IV Cardizem injection and slowed down, currently is in normal sinus rhythm and rate is under control. He is on telemetry monitoring and we will continue monitoring, follow serial troponins. Echocardiogram is done, still not reported by cardiologist, will wait for official report. He needs to be on anticoagulation. I will leave it up to the cardiologist to start that.  2.  History of coronary artery disease and coronary artery bypass graft. I will continue his aspirin and lisinopril and statin.  3.  Hyperlipidemia: Continue statin.  4.  Hypertension. Continue metoprolol, lisinopril.   5.  Benign prostatic hypertrophy.  Continue finasteride.   CODE STATUS: Full code.   TOTAL TIME SPENT ON THIS ADMISSION: 50 minutes.    ____________________________ Ceasar Lund Anselm Jungling, MD vgv:bu D: 09/27/2014 13:43:15 ET T: 09/27/2014 14:03:51 ET JOB#: [id number]  cc: Ceasar Lund. Anselm Jungling, MD, <Dictator> Jerrell Belfast, MD Minna Merritts, MD Vaughan Basta MD ELECTRONICALLY SIGNED 10/16/2014 9:56

## 2014-12-31 NOTE — Consult Note (Signed)
PATIENT NAME:  [name], [name] MR#:  [id number] DATE OF BIRTH:  [birth year]  DATE OF CONSULTATION:  10/29/2014  CONSULTING PHYSICIAN:  Leotis Pain, MD  REASON FOR CONSULTATION: Old stroke.  HISTORY OF PRESENT ILLNESS: This is a 81 year old, Caucasian male with a past medical history of hypertension, hyperlipidemia, coronary artery disease, admitted to the emergency department with a 1 week history of shortness of breath and confusion. The patient is not complaining of any cough, no sputum production. He was saturating 88% on room air and started on oxygen.   PAST MEDICAL HISTORY: Coronary artery disease, hyperlipidemia, old left-sided stroke, peripheral vascular disease, obstructive sleep apnea, GERD, BPH. The patient also has history of paroxysmal atrial fibrillation on Eliquis that he takes daily.   REVIEW OF SYSTEMS: History of shortness of breath, improved. No chest pain. No abdominal pain. No new weakness on one side of the body compared to the other.  DIAGNOSTIC DATA: MRI of the brain as described above; atrophy with remote lacunar infarcts bilateral in the basal ganglia. The patient does state he is compliant with his Eliquis.   NEUROLOGIC EVALUATION: The patient is able tell me name, date, time, location, and the reason why he is in the hospital. Facial sensation intact. Facial motor is intact. Tongue is midline. Uvula elevates symmetrically. Shoulder shrug is intact. Motor 4+ to 5/5 bilateral upper and lower extremities. No focal deficits on one side of the body compared to the other. No tingling. No numbness. Speech appears to be fluent.   IMPRESSION: A gentleman with acute respiratory failure. CT negative for pulmonary embolism that is noncompliant with his CPAP with paroxysmal atrial fibrillation on Eliquis daily, admitted with altered mental status that has improved. Neurological consultation for old strokes on the MRI.   PLAN: No further intervention from a neurological  standpoint. Those strokes are old in the bilateral basal ganglia. Continue daily Eliquis. Physical therapy/occupational therapy, discharge planning from a neurological standpoint. Mental status improved, as per family members at bedside.   Thank you. It was a pleasure seeing this patient.    ____________________________ Leotis Pain, MD yz:TT D: 10/29/2014 11:50:37 ET T: 10/29/2014 12:26:13 ET JOB#: [id number]  cc: Leotis Pain, MD, <Dictator> Leotis Pain MD ELECTRONICALLY SIGNED 11/16/2014 12:10

## 2014-12-31 NOTE — Discharge Summary (Signed)
PATIENT NAME:  [name], [name] MR#:  [id number] DATE OF BIRTH:  1934/08/20  DATE OF ADMISSION:  09/27/2014 DATE OF DISCHARGE:  09/28/2014  ADMITTING DIAGNOSES: Atrial fibrillation, rapid ventricular response.   DISCHARGE DIAGNOSES: 1.  Paroxysmal atrial fibrillation, rapid ventricular response, in sinus rhythm now.  2.  History of hypertension. 3.  Hyperlipidemia.  4.  Coronary artery disease, status post coronary artery bypass grafting. 5.  Left cerebrovascular accident. 6.  Peripheral vascular disease. 7.  Obstructive sleep apnea. 8.  Gastroesophageal reflux disease. 9.  BPH.  DISCHARGE CONDITION: Stable.   DISCHARGE MEDICATIONS: The patient is to continue: 1.  Dexilant 60 mg p.o. daily.  2.  Metoprolol tartrate 25  mg p.o. daily.  3.  Aspirin 81 mg p.o. every second day. 4.  Crestor 20 mg p.o. at bedtime. 5.  Finasteride 5 mg at bedtime. 6.  Vitamin D3, 400 International Units p.o. daily. 7.  Fish oil 1 gram once daily. 8.  Zetia 10 mg p.o. at bedtime.  9.  Probiotic formula once daily. 10.  Nystatin 1 capsule hourly as needed. 11.  Tamsulosin 0.4 mg p.o. at bedtime. 12.  Lisinopril 10 mg p.o. at bedtime. 13.  Eliquis 5 mg p.o. twice daily. 14.  Magnesium oxide 400 mg p.o. daily. 15.  Cardizem CD 120 mg p.o. once daily.   HOME OXYGEN: None.   DIET: 2 gram salt, low fat, low cholesterol, regular consistency.   ACTIVITY LIMITATIONS: As tolerated.   REFERRAL: To outpatient physical therapy.    FOLLOWUP APPOINTMENTS: Dr. [name] in 2 days after discharge, Dr. Rockey Situ in 1 week after discharge.  CONSULTANTS: Care management, social work, [employer]. Idolina Primer, PA-C, Minna Merritts, MD.  RADIOLOGIC STUDIES: Chest x-ray, PA and lateral, 09/27/2014, revealing no acute cardiopulmonary abnormality. Echocardiogram, 09/27/2014, revealed left ventricular ejection fraction by visual estimation 60%-65%, normal global left ventricular systolic function, impaired relaxation pattern of  left ventricular diastolic filling, normal right ventricular size and systolic function, mildly dilated left atrium, normal right ventricular systolic pressure.   HOSPITAL COURSE: The patient is a 81 year old male with past medical history significant for history of multiple medical problems, who presents to the hospital as direct admit by Dr. Rockey Situ due to atrial fibrillation, RVR. Apparently, the patient was at outpatient stress test. It was hooked up to the EKG and was noted to be in atrial fibrillation, RVR, at a rate of 170. So, stress test was canceled. He was given injection of Cardizem IV and admitted to the hospital for further evaluation.   On arrival to the hospital, the patient's temperature was 97.5, pulse was 108, respiration rate was 18, blood pressure 142/86, saturation was 93% on room air. Physical exam was unremarkable.   LABORATORY DATA: Done on arrival to the hospital showed BNP normal except magnesium level of 1.7 and glucose level of 102. The patient's liver enzymes revealed albumin level of 3.2 and a total bilirubin of 1.7. The patient's TSH was normal at 1.06. Cardiac enzymes were checked and were found to be all normal. The patient's CBC was within normal limits with white blood cell count 10.3, hemoglobin 15.4, platelet count 150,000.  Absolute neutrophil count was slightly elevated at 7.9. Coagulation panel was unremarkable.   EKG showed atrial fibrillation, RVR, rate of 170s initially and converted into sinus rhythm on 09/27/2014 at 11:05. The patient's chest x-ray was unremarkable. The patient was admitted to the hospital for further evaluation. He was initiated on Cardizem CD. With this, he converted into  sinus rhythm and remained in sinus rhythm. He was seen and followed by cardiologist who recommended no further evaluation except possibly getting stress test as outpatient. The patient felt satisfactory and was ready to be discharged on 09/28/2014. His vital signs on the day of  the day of discharge, temperature was 97.9, pulse was  in 70s, respiration rate was 18, blood pressure ranging 935-701X systolic, and 79T-90Z diastolic, oxygen saturations were from 90%-95% on room air at rest.   TIME SPENT: 40 minutes.   ____________________________ Theodoro Grist, MD rv:LT D: 10/01/2014 13:52:17 ET T: 10/01/2014 21:22:34 ET JOB#: [id number]  cc: Theodoro Grist, MD, <Dictator> Minna Merritts, MD Jerrell Belfast, MD Theodoro Grist MD ELECTRONICALLY SIGNED 10/12/2014 15:58

## 2014-12-31 NOTE — Consult Note (Signed)
General Aspect Primary Cardiologist: Dr. Ellyn Hack, MD _______________  81 year old male with history of CAD s/p 4 vessel CABG in 1999, carotid artery occlusion s/p L CEA, ischemic heart disease with chronotropic incompetence, HLD, HTN, OSA on CPAP, aortoiliac occlusive disease s/p common iliac artery stenting 2007, and GERD who presented to [facility] this morning for nuclear stress testing and was found to be in new onset a-fib with RVR with heart rates in the 170s.  ______________  PMH: 1. CAD s/p 4 vessel CABG in 1999 2. Carotid artery occlusion s/p L CEA 3. Ischemic heart disease with chronotropic incompetence 4. HLD 5. HTN 6. OSA on CPAP 7. Aorto occlusive diseaes s/p common iliac artery stenting 2007 8. GERD ________________   Present Illness 81 year old male with the above problem list who presented to [facility] this morning for nuclear stress testing and was found to be in new onset a-fib with RVR with heart rates in the 170s.  He has known CAD s/p 4 vessel CABG in 1999 (LIMA-LAD, SVG-RCA, SVG-OM, SVG-DIAG). Last cardiac cath showed occluded SVG-D1;; ostial Left Main 80-90%, diffuse LAD disease w/ D1 &2 subtotalled, LCx 70% proximal with competitive flow from SVG-OM, RCA 100% occluded; patent LIMA-LAD, LC-RCA and SVG-OM. He underwent Myoview in 10/2010: Ex 6.5 min - 9 METS, HR to 149, No Ischemia or infarct. Echo 10/2010: showed EF >55%, mild Aortic Sclerosis, Concentric Remodelling. Recently he has been having labile blood pressures with systolic readings of low 342A to 190 and diastolci readings as high as 115-120. He has been asymptomatic with these readings. No headaches or dizziness or blurred vision. No anginal symptoms, orthopnea or edema. He has been essentially stable from a cardiac standpoint with no dyspnea with rest or exertion. He is still very active working on the treadmill and exercise bicycle as well as weights. He works out for at least 15-20 minutes on each. His lisinopril was  increased to 5 mg bid. Lopressor was continued at 25 mg bid. He was scheduled for a Myoview today based on his last OV on 1/14 secondary to his labile blood pressures. His primary cardiologist wanted to r/o any involvement of his coronary disease as the patient was somewhat concerned that there may be some correlation based on previous symptoms.  The patient showed up in nuclear medicine this morning for his stress test after holding his Lopressor the prior night and this morning. He was feeling like his usual self. No SOB, chest pain, edema, palpitations, diaphoresis, presyncope, or syncope. He had his reseting images without issue. Upon bieng hooked up to the monitor he was found to be in new onset a-fib with RVR with heart rate in the 170s. He was completely asymptomatic. Blood pressure was stable in the 150s/60s. He denied all symptoms including chest pain, palpitations, SOB, nausea, vomiting, diaphoresis, edema, presyncope, or syncope. I administered IV diltiazem 10 mg at 9:37 AM with improvement in HR to the 1-teens to 120s. His blood pressure remained stable in the 130s/50s. Again, he remained asymptomatic. Orders were palced for direct admission through the hospitalist service. I gave another IV diltiazem 10 mg at 9:50 AM with improvement in heart rate to the low 100s to upper 90s. He remained in a-fib. He remained asymptomatic. Upon questioning him further he reports having had a recent URI that prevented him from wearing his CPAP lately. He also falls asleep easily when sitting on the couch. Patient's girlfriend reprots his pulse has been slightly higher than normal lately,  running anywhere from the 70s to 90s and low 100s. He denies any recent chest pains, lower extremity edema, weight gain, palpitations, or orthopnea. He is currently resting comfortably in nuclear medicine.   Physical Exam:  GEN well developed, no acute distress, obese   HEENT hearing intact to voice   NECK supple  no JVD    RESP normal resp effort  clear BS   CARD Irregular rate and rhythm  No murmur   ABD denies tenderness  soft   LYMPH negative neck   EXTR negative edema   SKIN normal to palpation   NEURO cranial nerves intact   PSYCH alert, A+O to time, place, person, good insight   Review of Systems:  Subjective/Chief Complaint labile BP and heart rate   General: No Complaints   Skin: No Complaints   ENT: No Complaints   Eyes: No Complaints   Neck: No Complaints   Respiratory: No Complaints   Cardiovascular: No Complaints   Gastrointestinal: No Complaints   Genitourinary: No Complaints   Vascular: No Complaints   Musculoskeletal: No Complaints   Neurologic: No Complaints   Hematologic: No Complaints   Endocrine: No Complaints   Psychiatric: No Complaints   Review of Systems: All other systems were reviewed and found to be negative   Medications/Allergies Reviewed Medications/Allergies reviewed   Family & Social History:  Family and Social History:  Family History Negative  mother: ulcers and dementia; father ETOH abuse   Social History negative ETOH, negative Illicit drugs   + Tobacco Prior (greater than 1 year)  quit 1998   Place of Living Home     Hard of Hearing:    erectile dysfunction:    Hypercholesterolemia:    quadruple CABG:    Hypertension:    left ankle--cadaver bone:    Carotid Endarterectomy:   Home Medications:  Medication Instructions Status  Zofran ODT 4 mg oral tablet, disintegrating 1 tab(s) orally every 4 hours, As Needed- for Nausea, Vomiting  Active  Crestor 20 mg oral tablet daily    x 1 days  Active  aspirin 81 mg oral tablet 1 tab(s) orally once a day Active  doxazosin 2 mg oral tablet  Active  Dexilant 60 mg oral delayed release capsule  Active  finasteride 5 mg oral tablet  Active  metoprolol tartrate 25 mg oral tablet  Active  lisinopril 20 mg oral tablet  Active  Celebrex 200 mg oral capsule 1 cap(s) orally once a  day Active   Lab Results:  Thyroid:  27-Jan-16 10:10   Thyroid Stimulating Hormone 1.06 (0.45-4.50 (IU = International Unit)  ----------------------- Pregnant patients have  different reference  ranges for TSH:  - - - - - - - - - -  Pregnant, first trimetser:  0.36 - 2.50 uIU/mL)  Hepatic:  27-Jan-16 10:10   Bilirubin, Total  1.7  Alkaline Phosphatase 70  SGPT (ALT) 28  SGOT (AST) 27  Total Protein, Serum 6.7  Albumin, Serum  3.2  Routine Chem:  27-Jan-16 10:10   Magnesium, Serum  1.7 (1.8-2.4 THERAPEUTIC RANGE: 4-7 mg/dL TOXIC: > 10 mg/dL  -----------------------)  Glucose, Serum  102  BUN 16  Creatinine (comp) 1.17  Sodium, Serum 140  Potassium, Serum 3.9  Chloride, Serum 107  CO2, Serum 23  Calcium (Total), Serum 8.9  Osmolality (calc) 281  eGFR (African American) >60  eGFR (Non-African American) >60 (eGFR values <55m/min/1.73 m2 may be an indication of chronic kidney disease (CKD). Calculated eGFR, using the MRDR  Study equation, is useful in  patients with stable renal function. The eGFR calculation will not be reliable in acutely ill patients when serum creatinine is changing rapidly. It is not useful in patients on dialysis. The eGFR calculation may not be applicable to patients at the low and high extremes of body sizes, pregnant women, and vegetarians.)  Anion Gap 10  Routine Hem:  27-Jan-16 10:10   WBC (CBC) 10.3  RBC (CBC) 4.90  Hemoglobin (CBC) 15.4  Hematocrit (CBC) 46.2  Platelet Count (CBC) 150  MCV 94  MCH 31.4  MCHC 33.3  RDW 13.6  Neutrophil % 76.5  Lymphocyte % 11.5  Monocyte % 11.2  Eosinophil % 0.1  Basophil % 0.7  Neutrophil #  7.9  Lymphocyte # 1.2  Monocyte #  1.2  Eosinophil # 0.0  Basophil # 0.1 (Result(s) reported on 27 Sep 2014 at 10:21AM.)   EKG:  EKG Interp. by me   Interpretation EKG shows a-fib with RVR, 169 bpm, RBBB   Radiology Results:  XRay:    27-Jan-16 12:33, Chest PA and Lateral  Chest PA and Lateral    REASON FOR EXAM:    a. fib RVR  COMMENTS:       PROCEDURE: DXR - DXR CHEST PA (OR AP) AND LATERAL  - Sep 27 2014 12:33PM     CLINICAL DATA:  Atrial fibrillation.    EXAM:  CHEST  2 VIEW    COMPARISON:  October 14, 2011.    FINDINGS:  Stable cardiomediastinal silhouette. Status post coronary artery  bypass graft. Stable right apical calcified granuloma. No  pneumothorax or pleural effusion is noted. No acute pulmonary  disease is noted.     IMPRESSION:  No acute cardiopulmonary abnormality seen.      Electronically Signed    By: Sabino Dick M.D.    On: 09/27/2014 13:06         Verified By: Marveen Reeks, M.D.,  Cardiology:    27-Jan-16 10:35, Echo Doppler  Echo Doppler   REASON FOR EXAM:      COMMENTS:       PROCEDURE: [facility] - ECHO DOPPLER COMPLETE(TRANSTHOR)  - Sep 27 2014 10:35AM     RESULT: Echocardiogram Report    Patient Name:   [name] Date of Exam: 09/27/2014  Medical Rec #:  [id number]            Custom1:  Date of Birth:  [birth year]         Height:       72.0 in  Patient Age:    46 years          Weight:       210.0 lb  Patient Gender: M                 BSA:          2.18 m??    Indications: Atrial Fib  Sonographer:    Sherrie Sport RDCS  Referring Phys: [name], W    Summary:   1. Left ventricular ejection fraction, by visual estimation, is 60 to   65%.   2. Normal global left ventricular systolic function.   3. Impaired relaxation pattern of LV diastolic filling.   4. Normal right ventricular size and systolic function.   5. Mildly dilated left atrium.   6. Normal RVSP  2D AND M-MODE MEASUREMENTS (normal ranges within parentheses):  Left Ventricle:         Normal  IVSd (2D):  1.24 cm (0.7-1.1)  LVPWd (2D):     1.33 cm (0.7-1.1) Aorta/LA:                 Normal  LVIDd (2D):     3.54 cm (3.4-5.7) Aortic Root (2D): 3.00 cm (2.4-3.7)  LVIDs (2D):     2.59 cm           Left Atrium (2D): 4.20 cm (1.9-4.0)  LV FS (2D):     26.8 %   (>25%)  LV EF (2D):     53.4 %  (>50%)                Right Ventricle:                                    RVd (2D):        1.16 cm  LV DIASTOLIC FUNCTION:  MV Peak E: 0.76 m/s E/e' Ratio: 11.10  MV Peak A: 1.26 m/s Decel Time: 148 msec  E/A Ratio: 0.60  SPECTRAL DOPPLER ANALYSIS (where applicable):  Mitral Valve:  MV P1/2 Time: 42.92 msec  MV Area, PHT: 5.13 cm??  Aortic Valve: AoV Max Vel: 0.83 m/s AoV Peak PG: 2.7 mmHg AoV Mean PG:  LVOT Vmax: 0.80 m/s LVOT VTI:  LVOT Diameter: 2.20 cm  AoV Area, Vmax: 3.65 cm?? AoV Area, VTI:  AoV Area, Vmn:  Tricuspid Valve and PA/RV Systolic Pressure: TR Max Velocity: 1.79 m/s RA   Pressure: 5 mmHg RVSP/PASP: 17.8 mmHg  Pulmonic Valve:  PV Max Velocity: 0.95 m/s PV Max PG: 3.6 mmHg PV Mean PG:    PHYSICIAN INTERPRETATION:  Left Ventricle: The left ventricular internal cavity size was normal. LV   posterior wall thickness was normal. No left ventricular hypertrophy.   Global LV systolic function was normal. Left ventricular ejection   fraction, by visual estimation, is 60 to 65%. Spectral Doppler shows   impaired relaxation pattern of LV diastolic filling.  Right Ventricle: Normal right ventricular size, wall thickness, and   systolic function. The right ventricular size is normal. Global RV   systolic function is normal.  Left Atrium: The left atrium is mildly dilated.  Right Atrium: The right atrium is normal in size.  Pericardium: There is no evidence of pericardial effusion.  Mitral Valve: The mitral valve is normal in structure. Trace mitral valve   regurgitation is seen. MAC noted.  Tricuspid Valve: The tricuspid valve is normal. Trivial tricuspid   regurgitation is visualized. The tricuspid regurgitant velocity is 1.79   m/s, and with an assumed right atrial pressure of 5 mmHg, the estimated   right ventricular systolic pressure is normal at 17.8 mmHg.  Aortic Valve: The aortic valve is normal. Mild to moderate aortic valve    sclerosis/calcification is present, without any evidence of aortic   stenosis. No evidence of aortic valve regurgitation is seen.  Pulmonic Valve:The pulmonic valve is normal. Trace pulmonic valve   regurgitation.  Aorta: The aortic root and ascending aorta are structurally normal, with     no evidence of dilitation.    [id number] Ida Rogue MD  Electronically signed by [id number] Ida Rogue MD  Signature Date/Time: 09/27/2014/2:09:24 PM    *** Final ***    IMPRESSION: .        Verified By: Minna Merritts, M.D., MD    No Known Allergies:   Vital Signs/Nurse's Notes:  **Vital Signs.:  27-Jan-16 12:10  Vital Signs Type Admission  Temperature Temperature (F) 97.5  Celsius 36.3  Pulse Pulse 108  Respirations Respirations 18  Systolic BP Systolic BP 132  Diastolic BP (mmHg) Diastolic BP (mmHg) 86  Mean BP 104  Pulse Ox % Pulse Ox % 93  Pulse Ox Activity Level  At rest  Oxygen Delivery Room Air/ 21 %    Impression 81 year old male with history of CAD s/p 4 vessel CABG in 1999, carotid artery occlusion s/p L CEA, ischemic heart disease with chronotropic incompetence, HLD, HTN, OSA on CPAP, aortoiliac occlusive disease s/p common iliac artery stenting 2007, and GERD who presented to [facility] this morning for nuclear stress testing and was found to be in new onset a-fib with RVR with heart rates in the 170s.   1. New onset a-fib with RVR  no symptoms with rates in the 170s -Possible etiology could be URI with not being able to use his CPAP in that setting  heart rates initially in the 170s upon presentation, converting to NSR after several hours, following diltiazem -Received IV diltiazem 10 mg x 2 with heart rate improvement to the low 100s to upper 90s  normal LV function on echo -CHADSVASc 4, giving him an estimated annual stroke risk of 4.0% ----would transition to Eliquis 5 mg bid or Xarelto 20 mg  -Continue home dose of metoprolol 25 mg bid -Add diltiazem 120 mg daily  with hold parameters   2. Labile blood pressures: -Pressures have been stable throughout our watch in nuc med -Conitnue to monitor with the above addition of diltiazem -Would plan for stress images  at a later date -Continue lisinopril 5 mg bid  3. CAD s/p 4 vessel CABG 1999: -No anginal symptoms -Would plan for stress images only as above at a later date  -Now that he will be placed on long term anticoagulation would discontinue aspirin therapy, with increased risk of bleeding seen in dual therapy  -Continue Lopressor as above  4. HLD: -Continue Crestor 20 mg daily and Zetia 10 mg daily   5. OSA on CPAP: -Consider titration if the above persists   6. Carotid occlusive disease: -Has outpatient follow up scheduled in Feb   Electronic Signatures: Rise Mu (PA-C)  (Signed 27-Jan-16 12:51)  Authored: General Aspect/Present Illness, History and Physical Exam, Review of System, Family & Social History, Past Medical History, Home Medications, Labs, EKG , Allergies, Impression/Plan Ida Rogue (MD)  (Signed 27-Jan-16 21:51)  Authored: General Aspect/Present Illness, History and Physical Exam, Review of System, Family & Social History, Home Medications, Labs, EKG , Radiology, Vital Signs/Nurse's Notes, Impression/Plan  Co-Signer: General Aspect/Present Illness, Home Medications, Allergies   Last Updated: 27-Jan-16 21:51 by Ida Rogue (MD)

## 2014-12-31 NOTE — Consult Note (Signed)
Brief Consult Note: Diagnosis: delirium, resolved.   Patient was seen by consultant.   Consult note dictated.   Comments: Psychiatry: Patient asymptomatic and with no acute delirium or signs of significant dementia. No history of psychiatric illness. No treatment indicated.  Electronic Signatures: Gonzella Lex (MD)  (Signed 03-Mar-16 15:05)  Authored: Brief Consult Note   Last Updated: 03-Mar-16 15:05 by Gonzella Lex (MD)

## 2014-12-31 NOTE — Discharge Summary (Signed)
PATIENT NAME:  [name], [name] MR#:  [id number] DATE OF BIRTH:  [birth year]  DATE OF ADMISSION:  10/28/2014 DATE OF DISCHARGE:  11/03/2014  ADMISSION COMPLAINT: Shortness of breath and confusion.   DISCHARGE DIAGNOSES:  1. Acute respiratory failure with hypoxia.  2. Probable chronic obstructive pulmonary disease.  3. Obstructive sleep apnea, noncompliant with CPAP.  4. Dementia with delirium.  5. Atrial fibrillation.  6. Coronary artery disease status post coronary artery bypass grafting.  7. Benign prosthetic hypertrophy.  8. Acute renal failure, now resolved.  9. Leukocytosis thought to be due to steroid reaction.   CONSULTATIONS:  1. Leotis Pain, MD, neurology.  2. Anthonette Legato, MD, nephrology.  3.  Alethia Berthold, MD, psychiatry.   PROCEDURES:  1.  CT scan of the head without contrast shows mild diffuse cortical atrophy, mild chronic ischemic white matter disease, no acute intracranial abnormalities.  2.  CT angiography of the chest for PE shows no evidence of pulmonary embolus. There are chronic interstitial changes suggestive of COPD.  Small hiatal hernia. Prior CABG. 3.  MRI of the brain without contrast shows no acute intracranial abnormality. Stable atrophy and diffuse white matter disease. Remote lacunar infarcts of the basal ganglia bilaterally. 4.  Renal ultrasound March 4 shows cortical thinning bilaterally, no obstructive changes.   HISTORY OF PRESENT ILLNESS: This is a 81 year old Caucasian man with history of hypertension, hyperlipidemia, coronary artery disease, who was sent to the Emergency Room from home due to shortness of breath and confusion for 1 week. According to his girlfriend and daughter he was also sent to the Emergency Room 1 week prior to that due to difficulty with urination. After going home his urinary symptoms improved, but shortness of breath, weakness, and confusion became worse. He also had low-grade fever with chills, but denied abdominal pain,  nausea, vomiting, or diarrhea. On presentation to the Emergency Room he was hypoxic with saturations at 88 on room air.   HOSPITAL COURSE BY PROBLEM:  1.  Acute respiratory failure with hypoxia: The patient was admitted and placed on supplemental oxygen. He was initially treated for COPD exacerbation with high-dose steroids and Levaquin and we were able to titrate his supplemental oxygen down to 1 liter. However, these medications seem to exacerbate his confusion/delirium so they were discontinued. Off of the steroids his oxygenation requirement is 2 liters via nasal cannula. He has had multiple chest x-rays, most recently on day of discharge which showed no active disease, no pneumonia. His CTA which was performed on admission was negative for pulmonary embolus, but does show changes suggestive of COPD.  He has a remote history of smoking.  2.  Probable COPD: Pulmonary function testing will need to be done in the outpatient setting to determine whether this patient has chronic COPD as a result of smoking. He is being discharged on Advair and Spiriva as well as supplemental oxygen. He would benefit from pulmonary consultation in the future. This was attempted upon discharge, but will need to be done through his primary care office.  3.  Obstructive sleep apnea, noncompliant with CPAP for greater than 2 months prior to admission. I suspect that this is the cause of his respiratory failure and probably contributing significantly to his episodes of delirium. During the hospitalization he was encouraged multiple times to use his CPAP machine and indeed on nights when he used it he was much less confused. He is going home on supplemental oxygen which will be attached to his CPAP machine at night.  4.  Dementia and delirium: The patient presented with greater than 1 week of confusion. Episodes of disorientation particularly daytime/nighttime disorientation, making phone calls at night, trying to get people to come  over to his house and pick him up in the middle of the night. These had been progressive for several weeks and persisted throughout the hospitalization, but were improving at the time of discharge. On presentation his urinalysis was negative for urinary tract infection. Chest x-ray negative for pneumonia. He did not exhibit any signs of other infection during the hospitalization. I suspect that he has chronic dementia which has been made worse recently by acute illness and then again by steroids, antibiotics, and unfamiliar environment of the hospital. At the time of discharge symptoms seem to be improving, but his family has been advised that he would be safest with 24 hour accompaniment. He was seen by psychiatry during the hospitalization and no further psychiatric medications were recommended. 5.  Atrial fibrillation: Rates were well controlled throughout the hospitalization on his home regimen of metoprolol 25 mg b.i.d. He continues on Eliquis for stroke prevention. He also  continues on diltiazem.  6.  Coronary artery disease, status post CABG: No chest pain or signs of acute coronary syndrome. He continues on Zetia, Crestor, metoprolol, Eliquis, and diltiazem.  7.  Benign prosthetic hypertrophy: No problems with this during hospitalization and UA was negative for signs of infection.  Continues on tamsulosin.  8.  Acute renal failure: During the hospitalization he did receive Lasix to attempt to improve his respiratory status. This created an acute renal failure which resolved with normal diet and hydration. On discharge his renal function is normal.  9.  Leukocytosis: I suspect this is a steroid reaction. He did not have a leukocytosis on presentation and this developed after administration of IV steroids to help with COPD exacerbation. This should be monitored in the outpatient setting by his primary care physician to insure that it decreases.   DISCHARGE PHYSICAL EXAMINATION:  VITAL SIGNS:  Temperature 98.2, pulse 89, respirations 18, blood pressure 128/79, oxygenation 93% at rest on 2.5 liters.  GENERAL: No acute distress.  CARDIOVASCULAR: Regular rate and rhythm. No murmurs, rubs, or gallops. No peripheral edema. Peripheral pulses 2 +.  RESPIRATORY: There are bibasilar crackles and fine expiratory wheezes. Good air movement, no respiratory distress.  ABDOMEN: Soft, nontender, nondistended. Bowel sounds normal. No guarding.  No rebound.  MUSCULOSKELETAL: No joint effusions. Strength 5 out of 5 throughout.  PSYCHIATRIC: The patient is alert and oriented at the time of interview with no confusion, good insight into his clinical condition.   LABORATORY DATA: Sodium 138, potassium 4.4, chloride 104, bicarbonate 31, BUN 30, creatinine 1.29, glucose 116. White blood cells 17.3, hemoglobin 13.8, platelets 302,000, MCV 95.  UA negative for signs of infection.  ANA negative. Vitamin B12, 315, low normal.   HOME MEDICATIONS:  1. Metoprolol tartrate 25 mg 1 tablet twice a day.  2. Eliquis 5 mg 1 tablet twice a day.  3. Dexilant 60 mg 1 tablet once a day.  4. Finasteride 5 mg 1 tablet once a day.  5. Zetia 10 mg 1 tablet once a day.  6. Crestor 20 mg 1 tablet once a day.  7. Diltiazem 120 mg 1 capsule once a day.  8. Magnesium oxide 400 mg 1 tablet once a day.  9. Tamsulosin 0.4 mg 1 capsule once a day.  10. Fluticasone salmeterol 250 mcg-50 mcg 1 puff inhaled twice a day.  11.  Oxygen 3 liters to wear at all times and attach to CPAP mask at night for COPD and obstructive sleep apnea.   CONDITION ON DISCHARGE: Stable.   DISPOSITION: Discharge to home with home health nursing and 24-hour family care.   DISCHARGE INSTRUCTIONS:    DIET: Low fat, low sodium, low cholesterol diet.   ACTIVITY LIMITATIONS: None.   TIME FRAME FOR FOLLOWUP: Please follow up as scheduled on March 16 with Dr. [name].   TIME SPENT ON DISCHARGE: 45 minutes.     ____________________________ Earleen Newport.  Volanda Napoleon, MD cpw:bu D: 11/03/2014 15:21:57 ET T: 11/03/2014 17:31:53 ET JOB#: [id number]  cc: Barnetta Chapel P. Volanda Napoleon, MD, <Dictator> Aldean Jewett MD ELECTRONICALLY SIGNED 11/11/2014 10:55

## 2015-01-03 DIAGNOSIS — L03119 Cellulitis of unspecified part of limb: Secondary | ICD-10-CM | POA: Diagnosis not present

## 2015-01-03 DIAGNOSIS — S81801A Unspecified open wound, right lower leg, initial encounter: Secondary | ICD-10-CM | POA: Diagnosis not present

## 2015-01-04 ENCOUNTER — Telehealth: Payer: Self-pay

## 2015-01-04 NOTE — Telephone Encounter (Signed)
Pt friend called, states pt is taking Lasix as needed. States last night right leg was "very very swollen". Took Lasix this morning.  Pt c/o of Chest Pain: STAT if CP now or developed within 24 hours  1. Are you having CP right now? Not right this minute, but did 20 minutes ago  2. Are you experiencing any other symptoms (ex. SOB, nausea, vomiting, sweating)? Yes in right leg,   3. How long have you been experiencing CP? Couple hours, started this morning  4. Is your CP continuous or coming and going? Coming and going  5. Have you taken Nitroglycerin? no ?

## 2015-01-04 NOTE — Telephone Encounter (Signed)
Called to check on patient. Pt. Indicated that he took lasix and  swelling in lower extremities improved. Denied any chest pain/pressure/"aching" since our last phone conversation today.  Says he feels better. Reminded him that if he becomes short of breath and/or chest pain, he can call 911 or have someone take him to the nearest ER.

## 2015-01-04 NOTE — Telephone Encounter (Signed)
Pt friend, [name], called regarding pt. Right lower extremity swelling and chest "achiness" . Friend indicated pt. Bumped his right leg last night and it began to bleed. She took him to [facility] where she stated neosporin applied on the leg, wrapped it and prescribed Keflex.  This AM, bilateral lower extremity swelling, right more so than the left so she gave him lasix. Stated swelling somewhat improved but still edematous.   Stated he did not take his nitroglycerin SL - "aching" was on/off. Denies SOB. BP 173/97, HR 70,  Recommended to continue lasix as needed for swelling, nitroglycerin as needed for chest pain. If takes 3 nitroglycerin and CP symptoms unrelieved, contact EMS.  Also advised to contact EMS if symptoms persist and pt. needs to be evaluated.   Patient denies any chest pain at this time.  Ms. Kenton Kingfisher verbalized understanding of instructions and had no further questions. Instructed pt. To call back with any other questions.

## 2015-01-05 NOTE — Telephone Encounter (Addendum)
S/w pt who indicated he was doing much better today. Reviewed Dr. Allison Quarry recommendations as well as suggested pt wear his support stockings. Pt. Verbalized understanding and had no further questions.

## 2015-01-05 NOTE — Telephone Encounter (Signed)
Sounds like he is doing better.  CP could have been related to HTN & volume overload.  Can probably go back to original diuretic & BP meds.  [place]

## 2015-01-11 DIAGNOSIS — H3531 Nonexudative age-related macular degeneration: Secondary | ICD-10-CM | POA: Diagnosis not present

## 2015-01-31 DIAGNOSIS — R351 Nocturia: Secondary | ICD-10-CM | POA: Diagnosis not present

## 2015-01-31 DIAGNOSIS — R3915 Urgency of urination: Secondary | ICD-10-CM | POA: Diagnosis not present

## 2015-01-31 DIAGNOSIS — N401 Enlarged prostate with lower urinary tract symptoms: Secondary | ICD-10-CM | POA: Diagnosis not present

## 2015-02-02 ENCOUNTER — Telehealth: Payer: Self-pay | Admitting: *Deleted

## 2015-02-02 MED ORDER — LISINOPRIL 2.5 MG PO TABS: 2.5000 mg | ORAL_TABLET | Freq: Two times a day (BID) | ORAL | Status: DC

## 2015-02-02 NOTE — Telephone Encounter (Signed)
°  1. Which medications need to be refilled? Lisinopril 5 mg (they cut them in half)  2. Which pharmacy is medication to be sent to? [facility] in [place]   3. Do they need a 30 day or 90 day supply? 90   4. Would they like a call back once the medication has been sent to the pharmacy? No

## 2015-02-02 NOTE — Telephone Encounter (Signed)
Refill sent for lisinopril 2.5 mg one tablet twice a day.

## 2015-02-07 DIAGNOSIS — R410 Disorientation, unspecified: Secondary | ICD-10-CM | POA: Diagnosis not present

## 2015-02-07 DIAGNOSIS — R413 Other amnesia: Secondary | ICD-10-CM | POA: Diagnosis not present

## 2015-02-10 DIAGNOSIS — R799 Abnormal finding of blood chemistry, unspecified: Secondary | ICD-10-CM | POA: Insufficient documentation

## 2015-02-10 DIAGNOSIS — R0602 Shortness of breath: Secondary | ICD-10-CM | POA: Insufficient documentation

## 2015-02-10 DIAGNOSIS — K649 Unspecified hemorrhoids: Secondary | ICD-10-CM

## 2015-02-10 DIAGNOSIS — R531 Weakness: Secondary | ICD-10-CM | POA: Insufficient documentation

## 2015-02-10 DIAGNOSIS — K5900 Constipation, unspecified: Secondary | ICD-10-CM | POA: Insufficient documentation

## 2015-02-10 DIAGNOSIS — I6782 Cerebral ischemia: Secondary | ICD-10-CM | POA: Insufficient documentation

## 2015-02-10 DIAGNOSIS — L439 Lichen planus, unspecified: Secondary | ICD-10-CM | POA: Insufficient documentation

## 2015-02-10 DIAGNOSIS — G2581 Restless legs syndrome: Secondary | ICD-10-CM | POA: Insufficient documentation

## 2015-02-10 DIAGNOSIS — R918 Other nonspecific abnormal finding of lung field: Secondary | ICD-10-CM | POA: Insufficient documentation

## 2015-02-10 DIAGNOSIS — K5790 Diverticulosis of intestine, part unspecified, without perforation or abscess without bleeding: Secondary | ICD-10-CM | POA: Insufficient documentation

## 2015-02-10 DIAGNOSIS — R609 Edema, unspecified: Secondary | ICD-10-CM | POA: Insufficient documentation

## 2015-02-10 DIAGNOSIS — G4720 Circadian rhythm sleep disorder, unspecified type: Secondary | ICD-10-CM | POA: Insufficient documentation

## 2015-02-10 DIAGNOSIS — E538 Deficiency of other specified B group vitamins: Secondary | ICD-10-CM | POA: Insufficient documentation

## 2015-02-10 DIAGNOSIS — K1370 Unspecified lesions of oral mucosa: Secondary | ICD-10-CM | POA: Insufficient documentation

## 2015-02-10 DIAGNOSIS — I480 Paroxysmal atrial fibrillation: Secondary | ICD-10-CM | POA: Insufficient documentation

## 2015-02-10 DIAGNOSIS — R0902 Hypoxemia: Secondary | ICD-10-CM | POA: Insufficient documentation

## 2015-02-10 DIAGNOSIS — R5383 Other fatigue: Secondary | ICD-10-CM | POA: Insufficient documentation

## 2015-02-10 DIAGNOSIS — G4733 Obstructive sleep apnea (adult) (pediatric): Secondary | ICD-10-CM | POA: Insufficient documentation

## 2015-02-10 DIAGNOSIS — G939 Disorder of brain, unspecified: Secondary | ICD-10-CM | POA: Insufficient documentation

## 2015-02-10 DIAGNOSIS — D72829 Elevated white blood cell count, unspecified: Secondary | ICD-10-CM | POA: Insufficient documentation

## 2015-02-10 HISTORY — DX: Unspecified hemorrhoids: K64.9

## 2015-02-10 HISTORY — DX: Cerebral ischemia: I67.82

## 2015-02-13 DIAGNOSIS — N401 Enlarged prostate with lower urinary tract symptoms: Secondary | ICD-10-CM | POA: Diagnosis not present

## 2015-02-15 DIAGNOSIS — N401 Enlarged prostate with lower urinary tract symptoms: Secondary | ICD-10-CM | POA: Diagnosis not present

## 2015-02-15 DIAGNOSIS — R3915 Urgency of urination: Secondary | ICD-10-CM | POA: Diagnosis not present

## 2015-02-15 DIAGNOSIS — R351 Nocturia: Secondary | ICD-10-CM | POA: Diagnosis not present

## 2015-02-19 DIAGNOSIS — N401 Enlarged prostate with lower urinary tract symptoms: Secondary | ICD-10-CM | POA: Diagnosis not present

## 2015-02-19 DIAGNOSIS — R3915 Urgency of urination: Secondary | ICD-10-CM | POA: Diagnosis not present

## 2015-02-19 DIAGNOSIS — R351 Nocturia: Secondary | ICD-10-CM | POA: Diagnosis not present

## 2015-02-19 DIAGNOSIS — R350 Frequency of micturition: Secondary | ICD-10-CM | POA: Diagnosis not present

## 2015-02-21 ENCOUNTER — Ambulatory Visit
Admission: RE | Admit: 2015-02-21 | Discharge: 2015-02-21 | Disposition: A | Discharge status: Home / Self Care | Payer: Medicare Other | Source: Ambulatory Visit | Attending: Internal Medicine | Admitting: Internal Medicine

## 2015-02-21 DIAGNOSIS — I2510 Atherosclerotic heart disease of native coronary artery without angina pectoris: Secondary | ICD-10-CM | POA: Insufficient documentation

## 2015-02-21 DIAGNOSIS — Z87891 Personal history of nicotine dependence: Secondary | ICD-10-CM | POA: Diagnosis not present

## 2015-02-21 DIAGNOSIS — J439 Emphysema, unspecified: Secondary | ICD-10-CM | POA: Diagnosis not present

## 2015-02-21 DIAGNOSIS — K449 Diaphragmatic hernia without obstruction or gangrene: Secondary | ICD-10-CM | POA: Insufficient documentation

## 2015-02-21 DIAGNOSIS — J432 Centrilobular emphysema: Secondary | ICD-10-CM | POA: Diagnosis not present

## 2015-02-21 DIAGNOSIS — J431 Panlobular emphysema: Secondary | ICD-10-CM | POA: Diagnosis present

## 2015-02-21 DIAGNOSIS — I700 Atherosclerosis of aorta: Secondary | ICD-10-CM | POA: Diagnosis not present

## 2015-02-21 HISTORY — DX: Malignant (primary) neoplasm, unspecified: C80.1

## 2015-02-22 ENCOUNTER — Telehealth: Payer: Self-pay | Admitting: *Deleted

## 2015-02-22 DIAGNOSIS — M5136 Other intervertebral disc degeneration, lumbar region: Secondary | ICD-10-CM | POA: Diagnosis not present

## 2015-02-22 DIAGNOSIS — M5431 Sciatica, right side: Secondary | ICD-10-CM | POA: Diagnosis not present

## 2015-02-22 NOTE — Telephone Encounter (Signed)
[email or web address] 

## 2015-02-22 NOTE — Telephone Encounter (Signed)
Pt returned called and was given results of CT scan. No further questions patient has f/u appt on 03/12/15.

## 2015-02-22 NOTE — Telephone Encounter (Signed)
-----   Message from Vilinda Boehringer, MD sent at 02/21/2015  1:57 PM EDT ----- Regarding: CT Result Please let patient know that I have reviewed his CT, everything looks okay no significant masses.  He does have some emphysema and mild ILD changes which will be discussed at follow up visit.  thanks

## 2015-02-26 ENCOUNTER — Encounter: Payer: Self-pay | Admitting: Family Medicine

## 2015-02-26 ENCOUNTER — Ambulatory Visit (INDEPENDENT_AMBULATORY_CARE_PROVIDER_SITE_OTHER): Payer: Medicare Other | Admitting: Family Medicine

## 2015-02-26 VITALS — BP 135/70 | HR 57 | Temp 97.6°F | Resp 16 | Wt 229.6 lb

## 2015-02-26 DIAGNOSIS — I6523 Occlusion and stenosis of bilateral carotid arteries: Secondary | ICD-10-CM

## 2015-02-26 DIAGNOSIS — G4700 Insomnia, unspecified: Secondary | ICD-10-CM

## 2015-02-26 MED ORDER — TRAZODONE HCL 50 MG PO TABS: 25.0000 mg | ORAL_TABLET | Freq: Every evening | ORAL | Status: DC | PRN

## 2015-02-26 NOTE — Patient Instructions (Addendum)
May use medication as needed.

## 2015-02-26 NOTE — Progress Notes (Signed)
Subjective:     Patient ID: [name], male   DOB: 04-23-1934, 81 y.o.   MRN: [id number]  HPI  Chief Complaint  Patient presents with  . Sleeping Problem    Feeling restless  States he has a hard time getting to sleep. Significant other reports he appears very restless throughout the night. He is being followed by pulmonary for OSA and COPD. He has had recent sleep study and is on both supplemental oxygen with C-Pap. Has used Xanax in the past but I suggested tranquilizers would suppress his respiratory drive more.   Review of Systems  Respiratory:       Currently on low dose steroids       Objective:   Physical Exam  Constitutional: He appears well-developed and well-nourished. No distress.       Assessment:    1. Insomnia - traZODone (DESYREL) 50 MG tablet; Take 0.5-1 tablets (25-50 mg total) by mouth at bedtime as needed for sleep.  Dispense: 30 tablet; Refill: 3    Plan:    To f/u if not improving. Suggested steroid may play a part in his restlessness.

## 2015-02-27 ENCOUNTER — Other Ambulatory Visit: Payer: Self-pay | Admitting: Family Medicine

## 2015-02-27 ENCOUNTER — Telehealth: Payer: Self-pay | Admitting: Family Medicine

## 2015-02-27 DIAGNOSIS — G4700 Insomnia, unspecified: Secondary | ICD-10-CM

## 2015-02-27 MED ORDER — NORTRIPTYLINE HCL 10 MG PO CAPS: ORAL_CAPSULE | ORAL | Status: DC

## 2015-02-27 NOTE — Telephone Encounter (Signed)
Will try nortriptyline. Discussed I was afraid that tranquilizer medication might suppress his respiratory drive. He will discuss with his pulmonary M.D.at appointment 7/11.

## 2015-02-27 NOTE — Telephone Encounter (Signed)
Please advise if patient should d/c medication

## 2015-02-27 NOTE — Telephone Encounter (Signed)
Pt states he started the Rx traZODone (DESYREL) 50 MG tablet last night and had several side effects.  Pt had a hard time sleeping last night, dry mouth and a bad taste in his mouth.  Pt would like to try something different.  Pt is asking if he take the Rx Xanax. Pt states he has taken this before and it worked well for him.  [place].  CB#[phone] or [phone]

## 2015-03-07 ENCOUNTER — Encounter: Payer: Self-pay | Admitting: Cardiology

## 2015-03-07 ENCOUNTER — Ambulatory Visit (INDEPENDENT_AMBULATORY_CARE_PROVIDER_SITE_OTHER): Payer: Medicare Other | Admitting: Cardiology

## 2015-03-07 VITALS — BP 140/90 | HR 76 | Ht 72.0 in | Wt 226.5 lb

## 2015-03-07 DIAGNOSIS — Z9989 Dependence on other enabling machines and devices: Secondary | ICD-10-CM

## 2015-03-07 DIAGNOSIS — E663 Overweight: Secondary | ICD-10-CM

## 2015-03-07 DIAGNOSIS — G4733 Obstructive sleep apnea (adult) (pediatric): Secondary | ICD-10-CM

## 2015-03-07 DIAGNOSIS — I480 Paroxysmal atrial fibrillation: Secondary | ICD-10-CM | POA: Diagnosis not present

## 2015-03-07 DIAGNOSIS — I739 Peripheral vascular disease, unspecified: Secondary | ICD-10-CM | POA: Diagnosis not present

## 2015-03-07 DIAGNOSIS — I6523 Occlusion and stenosis of bilateral carotid arteries: Secondary | ICD-10-CM

## 2015-03-07 DIAGNOSIS — I4891 Unspecified atrial fibrillation: Secondary | ICD-10-CM

## 2015-03-07 DIAGNOSIS — R0600 Dyspnea, unspecified: Secondary | ICD-10-CM

## 2015-03-07 DIAGNOSIS — I2510 Atherosclerotic heart disease of native coronary artery without angina pectoris: Secondary | ICD-10-CM

## 2015-03-07 DIAGNOSIS — E785 Hyperlipidemia, unspecified: Secondary | ICD-10-CM

## 2015-03-07 DIAGNOSIS — I7409 Other arterial embolism and thrombosis of abdominal aorta: Secondary | ICD-10-CM

## 2015-03-07 DIAGNOSIS — R0609 Other forms of dyspnea: Secondary | ICD-10-CM

## 2015-03-07 DIAGNOSIS — I10 Essential (primary) hypertension: Secondary | ICD-10-CM

## 2015-03-07 DIAGNOSIS — I779 Disorder of arteries and arterioles, unspecified: Secondary | ICD-10-CM

## 2015-03-07 NOTE — Assessment & Plan Note (Signed)
Stable. Followed by Dr. Oneida Alar who also follows other PAD

## 2015-03-07 NOTE — Assessment & Plan Note (Signed)
Blood pressure is borderline today. But acceptable as we are trying to allow for mild permissive hypertension. He has not had significant hypertensive episodes and only used one when necessary diltiazem dose. Continue to use diltiazem or lisinopril. I recently reduced his lisinopril to 2.5 mg twice a day to help alleviate orthostatic hypotension/dizziness. I do with the ACE inhibitor on board for afterload reduction. We'll need to monitor his blood pressures, to see if there is a trend toward persistent hypertension.

## 2015-03-07 NOTE — Assessment & Plan Note (Signed)
Doing much better. Certainly being in sinus rhythm helps. Continue low-dose beta blocker with when necessary calcium channel blocker. He would prefer a little dyspnea to the edema from diltiazem. Overall symptoms have improved and back to exercising routinely.

## 2015-03-07 NOTE — Progress Notes (Signed)
PCP: Margarita Rana, MD  PULMONOLOGIST: Vilinda Boehringer, MD  Clinic Note: Chief Complaint  Patient presents with  . other    3 month f/u no complaints. Meds reveiwed verbally with pt.  . Atrial Fibrillation  . Coronary Artery Disease  . Hyperlipidemia    HPI: [name] is a 81 y.o. male with a PMH below (CAD s/p CABGx4 in 1999, carotid artery occlusion s/p L CEA, ischemic heart disease with chronotropic incompetence, HLD, HTN, OSA on CPAP, aortoiliac occlusive disease s/p common iliac artery stenting 2007) who presents today for 3 month follow-up for CAD and PAF.  Prior to his previous hospitalizations this year, he routinely exercises at the gym on a daily basis.  He was last seen on April 13 in follow-up from 2 recent hospitalizations. --> Reduce lisinopril back to 2.5 mg twice a day. DC'd diltiazem.  Recent Hospitalizations with Studies:   [facility] Jan 27-28: New-onset A. fib RVR (170) --> identified when he came in for stress test. Was asymptomatic. Rate improved with diltiazem IV followed by spontaneous conversion.  Increased lisinopril to 5 mg twice a day. Anticoagulation with ELIQUIS, Lopressor 25 twice a day plus diltiazem 120 mg daily.  Myoview: No ischemia (see PSH below)  Echocardiogram: No RWMA, normal LV function  (see PSH below)  [facility] February 27-March 4: Shortness of breath/bronchitis with confusion/ AMS --> Acute Renal Failure; however, no apparent CHF.  Not using CPAP - hypercapnia related AMS.  Referred to [facility] Medicine (Dr. Stevenson Clinch) w/ Dx of Panlobular Emphysema/COPD + Interstitial Lung Disease (ILD). --> Now he reports wearing CPAP (12 cm H20) every night   CT the chest showed no PE but showed chronic interstitial changes suggestive of COPD.  CT of the head showed diffuse cortical atrophy and mild chronic ischemic white matter disease but no acute intracranial abnormalities   MRI of the brain showed no acute intracranial abnormality. Stable atrophy and  diffuse white matter disease with remote lacunar infarcts and basal ganglia.  Renal ultrasound on March 4 showed cortical thinning bilaterally but no obstructive changes.   Recent Studies:    High-Resolution CT scan of chest 02/21/2015:  1. Fibrotic changes in the lung bases, predominantly in the dependent portion of the right lower lobe --> favored to be areas of post infectious/inflammatory fibrosis, rather than indicative of more generalized ILD.   Rec: relook CT in 12 months 2. Mild diffuse bronchial wall thickening with mild centrilobular and paraseptal emphysema. Mild air trapping.  3. LM & 3 V atherosclerosis. S/p median sternotomy/CABG including LIMA-LAD  4.  calcification of the aortic valve, mitral annulus and mitral valve. 5. Small hiatal hernia and small amount of biliary sludge/tiny gallstones in gallbladder   Interval History: Returns today, doing pretty well.  Feels well, only c/o L ankle pain & easy bruising. Only 1 x taking Diltiazem for BP -not rapid HR.  doing very well and has not had any significant cardiac complaints.  Back to exercising ~ daily.   Bicycle, light upper body & Dancercise class; no longer walking on TM b/c ankle pain. Most notable complaint - easy bruising on Eliquis.  He has chronic pain and swelling in the left ankle but actually has more edema in the right.  Cardiovascular ROS: positive for - occasional R Leg swelling - taking lasix very rarely. negative for - chest pain, dyspnea on exertion, irregular heartbeat, loss of consciousness, orthopnea, palpitations, paroxysmal nocturnal dyspnea, rapid heart rate, shortness of breath or TIA/Amaurosis Fugax, syncope /  near syncope.   Past Medical History  Diagnosis Date  . Atherosclerotic heart disease of native coronary artery without angina pectoris 1998    Multivessel disease on cath for angina: S/p CABG x 4 (LIMA-LAD, SVG-RCA, SVG-OM, SVG-DIAG); SVG-Diag occluded on cath 1999  . Atherosclerotic heart  disease of artery bypass graft 1999    a) Cardiac Cath : Occluded SVG-D1;; ostial LM 80-90%, diffuse LAD dz w/ D1 &2 subtotalled, LCx 70% prox with competitive flow from SVG-OM, RCA 100% occluded; patent LIMA-LAD, LC-RCA and SVG-OM; b) TM Myoview 10/2010: Ex 6.5 min - 9 METS, HR to 149, No Ischemia or infarct, PVCs; c) Echo 10/2010: EF >55%, mild AoSclerosis, Conc Remodelling.; Myoview 2/ 2016: EF 55%. Low   risk. No ischemia.  . Carotid artery occlusion without infarction 06/2006    a) High Grade L Carotid Stenosis on Doppler -> s/x L CEA; b) Carotid Doppler 10/2013:: Left CEA patent, R. ICA 60-70% stenosis  . 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  . Aortoiliac occlusive disease April 2007    Status post bilateral common iliac artery stenting  . Ischemic heart disease with chronotropic incompetence     Seen on CPET to evaluate Exertional Dyspnea. Only able to achieve 80% of peak heart rate; peak VO2 was 75% - but possibly underestimated.  . Hyperlipidemia with target LDL less than 70   . Essential hypertension   . OSA on CPAP   . Erectile dysfunction due to arterial insufficiency   . Sciatic pain     Chronic  . GERD (gastroesophageal reflux disease)   . PAF (paroxysmal atrial fibrillation) February 2016    Anticoagulated with Eliquis. On combination beta blocker and calcium channel blocker  . Colon polyps   . TIA (transient ischemic attack)   . Insomnia   . Memory loss   . Lichen planus   . Allergy   . COPD (chronic obstructive pulmonary disease)   . Osteoporosis   . Cancer     Past Surgical History  Procedure Laterality Date  . Coronary artery bypass graft  1998    LIMA-LAD, SVG-OM, SVG-RPDA, SVG-DIAG  . Cardiac catheterization  01/30/1998      total native LAD andRCA ,mod native CIRC; svg to RCA and  OBTUSE MARG. PATENT the left internal mammary artery graft to LAD ; LV NORMAL  /  . Doppler echocardiography  11/20/2010    EF =>55%; LV norm mild aortic scelorosis  . Nm myocar perf wall motion  11/11/2010; February 2016    a) TM STRESS----NORMAL PERFUSION ,EF 68%; b) EF 50-55%. Normal LV function. No significant ischemia or infarction.  [name] Cpet / met  12/2012    Mild chronotropic incompetence - read 82% of predicted; also reduced effort; peak VO2 15.7 / 75% (did not reach Max effort) -- suggested ischemic response in last 1.5 minutes of exercise. Normal pulmonary function on PFTs but poor response to  . Arch aortogram and carotid aortogram  06/02/2006    Dr Oneida Alar did surgery  . Iliac artery stent  12/15/2005    PTA and direct stenting rgt and lft common iliac arteries  . Thoracic aorta - carotid angiogram  October 2007    Dr. 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 Carotid endarterectomy  Left Oct. 29, 2007    Dr. Oneida Alar:  . Ankle surgery    . Appendectomy    . Hiatal hernia repair    . Transthoracic echocardiogram  February 2016    [facility]: Normal LV function. Dilated left atrium.    ROS: A comprehensive was performed. Review of Systems  Constitutional: Negative for weight loss (actually gained weight) and malaise/fatigue.  Respiratory: Negative for cough and sputum production.   Cardiovascular: Positive for leg swelling (mostly L ankle).  Gastrointestinal: Negative for blood in stool and melena.  Genitourinary: Negative for hematuria.  Musculoskeletal: Positive for joint pain (L ankle pain).  Neurological: Negative for dizziness.       Occasional slow/garbled speech in AM.  Endo/Heme/Allergies: Bruises/bleeds easily.  Psychiatric/Behavioral: The patient has insomnia (on 2nd sleep aid (1st one - did not help).  best help is Xanax.).   All other systems reviewed and are negative.   Current  Outpatient Prescriptions on File Prior to Visit  Medication Sig Dispense Refill  . albuterol (PROVENTIL HFA;VENTOLIN HFA) 108 (90 BASE) MCG/ACT inhaler Inhale 2 puffs into the lungs every 4 (four) hours as needed for wheezing or shortness of breath. 1 Inhaler 4  . apixaban (ELIQUIS) 5 MG TABS tablet Take 1 tablet (5 mg total) by mouth 2 (two) times daily. 60 tablet 3  . Ascorbic Acid (VITAMIN C) 1000 MG tablet Take 1,000 mg by mouth daily.    . Biotin 1000 MCG tablet Take 1,000 mcg by mouth daily.    . Cyanocobalamin 1000 MCG TBCR Take 1,000 mcg by mouth daily.     [name] dexlansoprazole (DEXILANT) 60 MG capsule Take 60 mg by mouth daily.    . finasteride (PROSCAR) 5 MG tablet Take 5 mg by mouth daily.    . Fluticasone-Salmeterol (ADVAIR) 250-50 MCG/DOSE AEPB Inhale 1 puff into the lungs 2 (two) times daily. 60 each 5  . furosemide (LASIX) 20 MG tablet Take 1 tablet (20 mg total) by mouth as needed (as needed for swelling). 30 tablet 2  . lisinopril (PRINIVIL,ZESTRIL) 2.5 MG tablet Take 1 tablet (2.5 mg total) by mouth 2 (two) times daily. 180 tablet 6  . magnesium oxide (MAG-OX) 400 MG tablet Take 400 mg by mouth daily.    . metoprolol tartrate (LOPRESSOR) 25 MG tablet Take 25 mg by mouth 2 (two) times daily.     . nitroGLYCERIN (NITROSTAT) 0.4 MG SL tablet Place 1 tablet (0.4 mg total) under the tongue every 5 (five) minutes as needed for chest pain. 25 tablet 3  . nortriptyline (PAMELOR) 10 MG capsule As needed for insomnia 15 capsule 0  . Omega-3 Fatty Acids (FISH OIL PO) Take 700 Units by mouth daily.    . rosuvastatin (CRESTOR) 20 MG tablet Take 20 mg by mouth daily.    . tamsulosin (FLOMAX) 0.4 MG CAPS capsule Take 0.4 mg by mouth daily.    [name] ZETIA 10 MG tablet TAKE ONE TABLET BY MOUTH EVERY DAY 30 tablet 11   No current facility-administered medications on file prior to visit.   Allergies  Allergen Reactions  . Clonazepam Other (See Comments)    Altered mental status  . Codeine   .  Niacin And Related     History   Social History  . Marital Status: Widowed    Spouse Name: N/A  . Number of Children: N/A  . Years of Education: N/A   Occupational History  . Not on file.   Social History Main Topics  .  Smoking status: Former Smoker    Types: Cigarettes    Quit date: 08/04/1997  . Smokeless tobacco: Never Used  . Alcohol Use: No  . Drug Use: No  . Sexual Activity: Not on file   Other Topics Concern  . Not on file   Social History Narrative   He is a widowed father of one, grandfather of 62 with 3 stepchildren. He has been working out vigorously at [employer] the basis. He's up to doing 20 minutes on the stationary bike and 10-15 minutes on the treadmill. He doesn't smoke or drink.   Family History  Problem Relation Age of Onset  . Ulcers Mother     Peptic  . Dementia Mother   . Alcohol abuse Father     Wt Readings from Last 3 Encounters:  03/07/15 102.74 kg (226 lb 8 oz)  02/26/15 104.146 kg (229 lb 9.6 oz)  11/07/14 97.523 kg (215 lb)    PHYSICAL EXAM BP 140/90 mmHg  Pulse 76  Ht 6' (1.829 m)  Wt 102.74 kg (226 lb 8 oz)  BMI 30.71 kg/m2 - permissive HTN. General appearance: A&O X 3, cooperative, appears stated age, no distress and healthy-appearing, well-nourished and well-groomed. Very pleasant mood and affect. Answers questions probably.  HEENT: [place]/AT, EOMI, MMM, anicteric sclera Neck: no adenopathy, no carotid bruit, no JVD and supple, symmetrical, trachea midline  Lungs: CTAB, normal percussion bilaterally and Nonlabored, good air movement (slight upper airways "wheezing") Heart: RRR, S1: normal, S2: physiologically split, no S3 or S4; 1/6 c-d/ Early peaking SEM at 2nd right IS-->to carotids and no rub Abdomen: soft, non-tender; bowel sounds normal; no masses, no HSM; mild residual truncal obesity  Extremities: extremities normal, atraumatic, no cyanosis with at least 2+ bilateral lower edema from knees down., Pulses: 2+ and symmetric    Neurologic: normal strength and tone. Normal symmetric reflexes. Normal coordination and gait   Adult ECG Report  Rate: 76 ;  Rhythm: normal sinus rhythm and RBBB, normal axis & intervals.  Narrative Interpretation: Stable.   Other studies Reviewed: Additional studies/ records that were reviewed today include:  Recent Labs:  N/a; PCP checks cholesterol.    ASSESSMENT / PLAN: Problem List Items Addressed This Visit    Aorto-iliac disease - Primary (Chronic)    Stable. Followed by Dr. Oneida Alar who also follows other PAD      Atherosclerotic heart disease of native coronary artery without angina pectoris - s/p CABG, occluded SVG-D1; patent LIMA-LAD, SVG-OM, SVG- RCA (Chronic)    Active without any anginal or heart failure symptoms. On stable regimen of statin, beta blocker and ACE inhibitor. No longer taking aspirin due to full anticoagulation on ELIQUIS      Relevant Orders   Lipid Profile   Comp Met (CMET)   Dyspnea on exertion - -essentially resolved with BB dose reduction & wgt loss (Chronic)    Doing much better. Certainly being in sinus rhythm helps. Continue low-dose beta blocker with when necessary calcium channel blocker. He would prefer a little dyspnea to the edema from diltiazem. Overall symptoms have improved and back to exercising routinely.      Essential hypertension (Chronic)    Blood pressure is borderline today. But acceptable as we are trying to allow for mild permissive hypertension. He has not had significant hypertensive episodes and only used one when necessary diltiazem dose. Continue to use diltiazem or lisinopril. I recently reduced his lisinopril to 2.5 mg twice a day to help alleviate orthostatic hypotension/dizziness. I  do with the ACE inhibitor on board for afterload reduction. We'll need to monitor his blood pressures, to see if there is a trend toward persistent hypertension.      Hyperlipidemia with target LDL less than 70 (Chronic)    On Zetia  plus Crestor. Unfortunately I have not seen any recent labs. He is not sure when his last labs were. We will order lipid panel with chemistry panel. His question was whether or not he needs to be on Zetia along with Crestor.      OSA on CPAP (Chronic)    Wearing routinely now. Followed by Dr. Stevenson Clinch.      Overweight (BMI 25.0-29.9) -- Wgt back up (Chronic)    Slight weight gain from last visit. He does acknowledge the reason less conscious of his dietary discretion.      PAD (peripheral artery disease) - bilateral common iliac stents (Chronic)    Followed by Dr. Oneida Alar. Stable carotid and iliac disease by Dopplers.      PAF (paroxysmal atrial fibrillation) (Chronic)    Back in normal sinus rhythm. Currently on twice a day metoprolol for rate control with when necessary diltiazem. Full anticoagulation with ELIQUIS. Only bothered by easy bruising and mild skin bleeds.      Relevant Orders   EKG 12-Lead (Completed)      Current medicines are reviewed at length with the patient today. (+/- concerns) bruising with Eliquis The following changes have been made: no changes.   Your physician recommends that you return for lab work: fasting lipid and CMET. Nothing to eat or drink after midnight the evening before your labs.   Leonie Man, M.D., M.S. Interventional Cardiologist   Pager # [phone]

## 2015-03-07 NOTE — Assessment & Plan Note (Signed)
Wearing routinely now. Followed by Dr. Stevenson Clinch.

## 2015-03-07 NOTE — Patient Instructions (Addendum)
Medication Instructions:  Your physician recommends that you continue on your current medications as directed. Please refer to the Current Medication list given to you today.   Labwork: Your physician recommends that you return for lab work: fasting lipid and CMET. Nothing to eat or drink after midnight the evening before your labs.   Testing/Procedures: none  Follow-Up: Your physician wants you to follow-up in: six months with Dr. Ellyn Hack.  You will receive a reminder letter in the mail two months in advance. If you don't receive a letter, please call our office to schedule the follow-up appointment.   Any Other Special Instructions Will Be Listed Below (If Applicable).

## 2015-03-07 NOTE — Assessment & Plan Note (Signed)
Back in normal sinus rhythm. Currently on twice a day metoprolol for rate control with when necessary diltiazem. Full anticoagulation with ELIQUIS. Only bothered by easy bruising and mild skin bleeds.

## 2015-03-07 NOTE — Assessment & Plan Note (Signed)
Slight weight gain from last visit. He does acknowledge the reason less conscious of his dietary discretion.

## 2015-03-07 NOTE — Assessment & Plan Note (Signed)
Followed by Dr. Oneida Alar. Stable carotid and iliac disease by Dopplers.

## 2015-03-07 NOTE — Assessment & Plan Note (Signed)
Active without any anginal or heart failure symptoms. On stable regimen of statin, beta blocker and ACE inhibitor. No longer taking aspirin due to full anticoagulation on ELIQUIS

## 2015-03-07 NOTE — Assessment & Plan Note (Signed)
On Zetia plus Crestor. Unfortunately I have not seen any recent labs. He is not sure when his last labs were. We will order lipid panel with chemistry panel. His question was whether or not he needs to be on Zetia along with Crestor.

## 2015-03-09 ENCOUNTER — Other Ambulatory Visit: Payer: Self-pay | Admitting: Family Medicine

## 2015-03-09 DIAGNOSIS — K219 Gastro-esophageal reflux disease without esophagitis: Secondary | ICD-10-CM

## 2015-03-12 ENCOUNTER — Encounter: Payer: Self-pay | Admitting: Internal Medicine

## 2015-03-12 ENCOUNTER — Ambulatory Visit (INDEPENDENT_AMBULATORY_CARE_PROVIDER_SITE_OTHER): Payer: Medicare Other | Admitting: Internal Medicine

## 2015-03-12 VITALS — BP 114/72 | HR 80 | Temp 97.8°F | Ht 72.0 in | Wt 224.0 lb

## 2015-03-12 DIAGNOSIS — I6523 Occlusion and stenosis of bilateral carotid arteries: Secondary | ICD-10-CM

## 2015-03-12 DIAGNOSIS — J849 Interstitial pulmonary disease, unspecified: Secondary | ICD-10-CM | POA: Diagnosis not present

## 2015-03-12 DIAGNOSIS — Z9989 Dependence on other enabling machines and devices: Principal | ICD-10-CM

## 2015-03-12 DIAGNOSIS — G4733 Obstructive sleep apnea (adult) (pediatric): Secondary | ICD-10-CM | POA: Diagnosis not present

## 2015-03-12 DIAGNOSIS — J432 Centrilobular emphysema: Secondary | ICD-10-CM | POA: Diagnosis not present

## 2015-03-12 NOTE — Assessment & Plan Note (Signed)
Patient with previous hospitalization, CT scan  this hospitalization showed chronic interstitial processes and peripheral groundglass opacities. 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 He does have a history of COPD and is currently on Advair to 250/50. Repeat high-resolution CT scan with mild centrilobular and paraseptal emphysema. There was some scattered mild subpleural reticulation, which were asymmetrically distributed predominantly in the lung bases, particularly at on the right side. These were mildly increased from CT scan on 10/28/2014, no other widespread areas of groundglass attenuation noted. Overall there are some mild fibrotic changes in the lung bases which are predominantly in the dependent portion of the right lower lobe and are favored to be areas of post infectious/inflammatory fibrosis, rather than of a more generalized interstitial lung disease. Plan for repeat CT, high-resolution, in 1 year; this will assess temporal changes.   Plan: -Continue with COPD optimization, continue with OSA optimization. -High-resolution CT chest without contrast in one year to assess temporal changes

## 2015-03-12 NOTE — Assessment & Plan Note (Signed)
Patient of prolonged history of smoking in the past however quit a number of years ago, currently on Advair 250/50.  Plan: -Continue Advair -Continue with tobacco cessation -Continue withdiet and exercise -continue using NIVPPV (non-invasive positive pressure ventilation, CPAP) for OSA. -O2 at night, 2L , will repeat overnight pulse oximetry study, based on results of studies will decide if to stop supplemental oxygen at night

## 2015-03-12 NOTE — Progress Notes (Signed)
MRN# [id number] CALIB WADHWA [birth year]   CC: Chief Complaint  Patient presents with  . Follow-up    Pt wears CPAP with 2 L 02. Pt does not have any complaints with cpap therapy. He was out of town a week ago and did not use 02. Wife reports 02 sats stayed 95%.      Brief History: HPI 11/14/14 Patient is a pleasant 81 year old male with a past medical history of hypertension, hyperlipidemia, obstructive sleep apnea on CPAP(not using), coronary artery disease who is seen today for hospital followup of acute respiratory failure. Detailed history as outlined hospital summarization below. Briefly, one week prior to admission patient started having gradual onset of shortness of breath, and confusion, he was seen at the emergency room initially for difficulty with urination, and was discharged from the emergency room at that time. The week progresses confusion shortness of breath continued and he represented to the emergency room chest x-ray showed probable pneumonia at that time. Since discharge patient states that he's been doing well his breathing is back to baseline, and he is back to attending the gym. Of note 1 month prior to admission he was in his usual state of health he attends a gym daily, he has a girlfriend that accompanies him. Further history reveals that he had upper respiratory tract infection urine December, was diagnosed with bronchitis, this cleared, one week prior to hospitalization he started having wheezing initially was thought to have a UTI, but as stated above this was negative, and confusion ensued along with hypoxia leading to admission to the hospital for approximately one week.  Today patient states that he is doing well he did not endorse any worsening shortness of breath, he actually states his shortness of breath is improving, he does have a history of obstructive sleep apnea and was probably on CPAP but has not been wearing CPAP. He was diagnosed with OSA many years  ago at sleep med. He denies any drug use, previously had dogs at home, previously employed in [employer] work, he does have a significant tobacco history previously smoked one to 2 packs per day for 32 years and quit a number of years ago.  Upon discharge from the hospital he was discharged on Advair, 3 L of oxygen continuously even at night and advised to followup to determine his outpatient oxygen needs. During his visit today he had a 6 minute walk test that showed lowest saturation on room air to 89%, he walked 936 feet, his highest heart rate was 108.  Patient also stated that he is currently on BiPAP, but his hospital discharge states CPAP. Plan: PFTs and HRCT, continue with inhalers   ROV 12/06/14: Patient presents today for a follow up visit.  Wearing cpap machine every night. No worsening shortness of breath. Had a repeat titration study done. Started back exercising.   Plan: CPAP 12 cm of water, 7 nights per week, minimum 6 hours per night. Previously on BiPAP, now switched over based on new split study back to CPAP. Cont with 2L at night with CPAP Cont with COPD regiment.   Events since last clinic visit: Patient presents today for follow-up visit, he is accompanied by his girlfriend. Patient states overall he is doing well, exercising daily, eating healthy. He uses CPAP nightly about 5-6 hours per night. Patient stated that he recently went on vacation and for about 2-3 nights he did not have supplemental oxygen with CPAP, but checked his O2 saturations in the morning which are  consistently in the mid 90s. Patient had a repeat high-resolution CT scan of the chest performed about a week ago like to discuss the results of it.    Medication:   Current Outpatient Rx  Name  Route  Sig  Dispense  Refill  . albuterol (PROVENTIL HFA;VENTOLIN HFA) 108 (90 BASE) MCG/ACT inhaler   Inhalation   Inhale 2 puffs into the lungs every 4 (four) hours as needed for wheezing or shortness of  breath.   1 Inhaler   4   . apixaban (ELIQUIS) 5 MG TABS tablet   Oral   Take 1 tablet (5 mg total) by mouth 2 (two) times daily.   60 tablet   3   . Ascorbic Acid (VITAMIN C) 1000 MG tablet   Oral   Take 1,000 mg by mouth daily.         . Biotin 1000 MCG tablet   Oral   Take 1,000 mcg by mouth daily.         . Cyanocobalamin 1000 MCG TBCR   Oral   Take 1,000 mcg by mouth daily.          [name] DEXILANT 60 MG capsule      TAKE ONE CAPSULE BY MOUTH EVERY DAY   30 capsule   11   . finasteride (PROSCAR) 5 MG tablet   Oral   Take 5 mg by mouth daily.         . Fluticasone-Salmeterol (ADVAIR) 250-50 MCG/DOSE AEPB   Inhalation   Inhale 1 puff into the lungs 2 (two) times daily.   60 each   5   . furosemide (LASIX) 20 MG tablet   Oral   Take 1 tablet (20 mg total) by mouth as needed (as needed for swelling).   30 tablet   2   . lisinopril (PRINIVIL,ZESTRIL) 2.5 MG tablet   Oral   Take 1 tablet (2.5 mg total) by mouth 2 (two) times daily.   180 tablet   6   . magnesium oxide (MAG-OX) 400 MG tablet   Oral   Take 400 mg by mouth daily.         . metoprolol tartrate (LOPRESSOR) 25 MG tablet   Oral   Take 25 mg by mouth 2 (two) times daily.          . nitroGLYCERIN (NITROSTAT) 0.4 MG SL tablet   Sublingual   Place 1 tablet (0.4 mg total) under the tongue every 5 (five) minutes as needed for chest pain.   25 tablet   3   . nortriptyline (PAMELOR) 10 MG capsule      As needed for insomnia   15 capsule   0   . Omega-3 Fatty Acids (FISH OIL PO)   Oral   Take 700 Units by mouth daily.         . rosuvastatin (CRESTOR) 20 MG tablet   Oral   Take 20 mg by mouth daily.         . tamsulosin (FLOMAX) 0.4 MG CAPS capsule   Oral   Take 0.4 mg by mouth daily.         [name] ZETIA 10 MG tablet      TAKE ONE TABLET BY MOUTH EVERY DAY   30 tablet   11      Review of Systems: Gen:  Denies  fever, sweats, chills HEENT: Denies blurred vision, double  vision, ear pain, eye pain, hearing loss, nose bleeds, sore throat Cvc:  No dizziness, chest pain or heaviness Resp:   Admits to: Gi: Denies swallowing difficulty, stomach pain, nausea or vomiting, diarrhea, constipation, bowel incontinence Gu:  Denies bladder incontinence, burning urine Ext:   No Joint pain, stiffness or swelling Skin: No skin rash, easy bruising or bleeding or hives Endoc:  No polyuria, polydipsia , polyphagia or weight change Other:  All other systems negative  Allergies:  Clonazepam; Codeine; and Niacin and related  Physical Examination:  VS: BP 114/72 mmHg  Pulse 80  Temp(Src) 97.8 F (36.6 C) (Oral)  Ht 6' (1.829 m)  Wt 224 lb (101.606 kg)  BMI 30.37 kg/m2  SpO2 92%  General Appearance: No distress  HEENT: PERRLA, no ptosis, no other lesions noticed Pulmonary: Good respiratory effort, no wheezes, mild fine basilar crackles right greater than left   Cardiovascular:  Normal S1,S2.  No m/r/g.     Abdomen:Exam: Benign, Soft, non-tender, No masses  Skin:   warm, no rashes, no ecchymosis  Extremities: normal, no cyanosis, clubbing, warm with normal capillary refill.      Rad results: (The following images and results were reviewed by Dr. Stevenson Clinch). CT Chest 02/21/15 CT CHEST WITHOUT CONTRAST  TECHNIQUE: Multidetector CT imaging of the chest was performed following the standard protocol without intravenous contrast. High resolution imaging of the lungs, as well as inspiratory and expiratory imaging, was performed.  COMPARISON: Chest CT 10/28/2014.  FINDINGS: Mediastinum/Lymph Nodes: Heart size is normal. There is no significant pericardial fluid, thickening or pericardial calcification. There is atherosclerosis of the thoracic aorta, the great vessels of the mediastinum and the coronary arteries, including calcified atherosclerotic plaque in the left main, left anterior descending, left circumflex and right coronary arteries. Status post median  sternotomy for CABG, including LIMA to the LAD. Calcifications of the aortic valve. Extensive calcifications of the mitral annulus and posterior leaflet of the mitral valve. Lipomatous hypertrophy of the interatrial septum. No pathologically enlarged mediastinal or hilar lymph nodes. Please note that accurate exclusion of hilar adenopathy is limited on noncontrast CT scans. Small hiatal hernia.  Lungs/Pleura: High-resolution images demonstrate diffuse bronchial wall thickening with mild centrilobular and paraseptal emphysema. While there are some scattered areas of mild subpleural reticulation, these are asymmetrically distributed, predominantly in the lung bases, particularly on the right side dependently. In this same distribution there is some thickening of the peribronchovascular interstitium and some regional architectural distortion with some peripheral bronchiolectasis. These findings appear slightly increased compared to prior examination 10/28/2014. No other widespread areas of ground-glass attenuation, additional areas of subpleural reticulation, parenchymal banding or frank honeycombing are noted. Inspiratory and expiratory imaging demonstrates some very mild air trapping, indicative of mild small airways disease. Large calcified granuloma in the apex of the right upper lobe. 4 mm left lower lobe pulmonary nodule (image 41 of series 8), unchanged in retrospect compared to prior study 10/01/2012, considered benign. No other suspicious appearing pulmonary nodules or masses are noted. No acute consolidative airspace disease. No pleural effusions.  Upper Abdomen: Small amount of biliary sludge or tiny gallstones lying dependently in the gallbladder. No current findings to suggest acute cholecystitis the visualized portions of the abdomen at this time. Calcified granulomas in the spleen.  Musculoskeletal/Soft Tissues: Median sternotomy wires. There are no aggressive  appearing lytic or blastic lesions noted in the visualized portions of the skeleton.  IMPRESSION: 1. While there are some fibrotic changes in the lung bases, these are predominantly in the dependent portion of the right lower lobe, and are favored to be  areas of post infectious/inflammatory fibrosis, rather than indicative of a more generalized interstitial lung disease. Consideration for repeat high-resolution chest CT in 12 months is suggested to assess for temporal changes in the appearance of the lung parenchyma if clinically appropriate. 2. Mild diffuse bronchial wall thickening with mild centrilobular and paraseptal emphysema, and mild air trapping; imaging findings suggestive of underlying COPD. 3. Atherosclerosis, including left main and 3 vessel coronary artery disease. Status post median sternotomy for CABG, including LIMA to the LAD. 4. There are calcifications of the aortic valve, mitral annulus and mitral valve. Echocardiographic correlation for evaluation of potential valvular dysfunction may be warranted if clinically indicated. 5. Small hiatal hernia. 6. Small amount of biliary sludge or tiny gallstones lying dependently in the gallbladder.    Assessment and Plan: 81 year old male past medical history of COPD, OSA, seen in follow-up visit today. OSA on CPAP OSA  Discussed sleep data and reviewed with patient.  Encouraged proper weight management.  Excessive weight may contribute to snoring.  Monitor sedative use.  Discussed driving precautions and its relationship with hypersomnolence.  Discussed operating dangerous equipment and its relationship with hypersomnolence.  Discussed sleep hygiene, and benefits of a fixed sleep waked time.  The importance of getting eight or more hours of sleep discussed with patient.  Discussed limiting the use of the computer and television before bedtime.  Decrease naps during the day, so night time sleep will become enhanced.   Limit caffeine, and sleep deprivation.  HTN, stroke, and heart failure are potential risk factors.     Plan: CPAP 12 cm of water, 7 nights per week, minimum 6 hours per night. Repeat ONO while on CPAP  Currently he is on CPAP with 2 L of oxygen bleed in, will repeat ONO while on CPAP at night without supplemental oxygen          COPD (chronic obstructive pulmonary disease) Patient of prolonged history of smoking in the past however quit a number of years ago, currently on Advair 250/50.  Plan: -Continue Advair -Continue with tobacco cessation -Continue withdiet and exercise -continue using NIVPPV (non-invasive positive pressure ventilation, CPAP) for OSA. -O2 at night, 2L , will repeat overnight pulse oximetry study, based on results of studies will decide if to stop supplemental oxygen at night      ILD (interstitial lung disease) Patient with previous hospitalization, CT scan  this hospitalization showed chronic interstitial processes and peripheral groundglass opacities. 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 He does have a history of COPD and is currently on Advair to 250/50. Repeat high-resolution CT scan with mild centrilobular and paraseptal emphysema. There was some scattered mild subpleural reticulation, which were asymmetrically distributed predominantly in the lung bases, particularly at on the right side. These were mildly increased from CT scan on 10/28/2014, no other widespread areas of groundglass attenuation noted. Overall there are some mild fibrotic changes in the lung bases which are predominantly in the dependent portion of the right lower lobe and are favored to be areas of post infectious/inflammatory fibrosis, rather than of a more generalized interstitial lung disease. Plan for repeat CT, high-resolution, in 1 year; this will assess temporal changes.   Plan: -Continue with COPD optimization, continue  with OSA optimization. -High-resolution CT chest without contrast in one year to assess temporal changes        Updated Medication List Outpatient Encounter Prescriptions as of 03/12/2015  Medication Sig  . albuterol (PROVENTIL HFA;VENTOLIN HFA) 108 (90 BASE)  MCG/ACT inhaler Inhale 2 puffs into the lungs every 4 (four) hours as needed for wheezing or shortness of breath.  [name] apixaban (ELIQUIS) 5 MG TABS tablet Take 1 tablet (5 mg total) by mouth 2 (two) times daily.  . Ascorbic Acid (VITAMIN C) 1000 MG tablet Take 1,000 mg by mouth daily.  . Biotin 1000 MCG tablet Take 1,000 mcg by mouth daily.  . Cyanocobalamin 1000 MCG TBCR Take 1,000 mcg by mouth daily.   [name] DEXILANT 60 MG capsule TAKE ONE CAPSULE BY MOUTH EVERY DAY  . finasteride (PROSCAR) 5 MG tablet Take 5 mg by mouth daily.  . Fluticasone-Salmeterol (ADVAIR) 250-50 MCG/DOSE AEPB Inhale 1 puff into the lungs 2 (two) times daily.  . furosemide (LASIX) 20 MG tablet Take 1 tablet (20 mg total) by mouth as needed (as needed for swelling).  [name] lisinopril (PRINIVIL,ZESTRIL) 2.5 MG tablet Take 1 tablet (2.5 mg total) by mouth 2 (two) times daily.  . magnesium oxide (MAG-OX) 400 MG tablet Take 400 mg by mouth daily.  . metoprolol tartrate (LOPRESSOR) 25 MG tablet Take 25 mg by mouth 2 (two) times daily.   . nitroGLYCERIN (NITROSTAT) 0.4 MG SL tablet Place 1 tablet (0.4 mg total) under the tongue every 5 (five) minutes as needed for chest pain.  . nortriptyline (PAMELOR) 10 MG capsule As needed for insomnia  . Omega-3 Fatty Acids (FISH OIL PO) Take 700 Units by mouth daily.  . rosuvastatin (CRESTOR) 20 MG tablet Take 20 mg by mouth daily.  . tamsulosin (FLOMAX) 0.4 MG CAPS capsule Take 0.4 mg by mouth daily.  [name] ZETIA 10 MG tablet TAKE ONE TABLET BY MOUTH EVERY DAY   No facility-administered encounter medications on file as of 03/12/2015.    Orders for this visit: Orders Placed This Encounter  Procedures  . Pulse oximetry, overnight     Standing Status: Future     Number of Occurrences:      Standing Expiration Date: 03/11/2016    Scheduling Instructions:     Please schedule repeat ONO on CPAP     [facility]    Thank  you for the visitation and for allowing  [facility] to assist in the care of your patient. Our recommendations are noted above.  Please contact us if we can be of further service.  Vilinda Boehringer, MD [facility] Office Number: [phone]

## 2015-03-12 NOTE — Assessment & Plan Note (Signed)
OSA  Discussed sleep data and reviewed with patient.  Encouraged proper weight management.  Excessive weight may contribute to snoring.  Monitor sedative use.  Discussed driving precautions and its relationship with hypersomnolence.  Discussed operating dangerous equipment and its relationship with hypersomnolence.  Discussed sleep hygiene, and benefits of a fixed sleep waked time.  The importance of getting eight or more hours of sleep discussed with patient.  Discussed limiting the use of the computer and television before bedtime.  Decrease naps during the day, so night time sleep will become enhanced.  Limit caffeine, and sleep deprivation.  HTN, stroke, and heart failure are potential risk factors.     Plan: CPAP 12 cm of water, 7 nights per week, minimum 6 hours per night. Repeat ONO while on CPAP  Currently he is on CPAP with 2 L of oxygen bleed in, will repeat ONO while on CPAP at night without supplemental oxygen

## 2015-03-12 NOTE — Patient Instructions (Signed)
Follow up with Dr. Stevenson Clinch in 3 months - continue with your current COPD meds - repeat ONO with CPAP on Room air - cont with CPAP nightly with 2L O2 until results of ONO come back - cont with diet and exercise (as tolerated).

## 2015-03-13 ENCOUNTER — Other Ambulatory Visit: Payer: Self-pay | Admitting: *Deleted

## 2015-03-13 MED ORDER — FUROSEMIDE 20 MG PO TABS: 20.0000 mg | ORAL_TABLET | ORAL | Status: DC | PRN

## 2015-03-14 ENCOUNTER — Emergency Department
Admission: EM | Admit: 2015-03-14 | Discharge: 2015-03-14 | Disposition: A | Discharge status: Home / Self Care | Payer: Medicare Other | Attending: Emergency Medicine | Admitting: Emergency Medicine

## 2015-03-14 ENCOUNTER — Emergency Department: Payer: Medicare Other

## 2015-03-14 ENCOUNTER — Other Ambulatory Visit: Payer: Medicare Other

## 2015-03-14 ENCOUNTER — Encounter: Payer: Self-pay | Admitting: Emergency Medicine

## 2015-03-14 ENCOUNTER — Telehealth: Payer: Self-pay | Admitting: Cardiology

## 2015-03-14 DIAGNOSIS — R0781 Pleurodynia: Secondary | ICD-10-CM | POA: Diagnosis not present

## 2015-03-14 DIAGNOSIS — S20211A Contusion of right front wall of thorax, initial encounter: Secondary | ICD-10-CM | POA: Diagnosis not present

## 2015-03-14 DIAGNOSIS — R52 Pain, unspecified: Secondary | ICD-10-CM

## 2015-03-14 DIAGNOSIS — Y9289 Other specified places as the place of occurrence of the external cause: Secondary | ICD-10-CM | POA: Insufficient documentation

## 2015-03-14 DIAGNOSIS — S51801A Unspecified open wound of right forearm, initial encounter: Secondary | ICD-10-CM | POA: Insufficient documentation

## 2015-03-14 DIAGNOSIS — S52611A Displaced fracture of right ulna styloid process, initial encounter for closed fracture: Secondary | ICD-10-CM | POA: Insufficient documentation

## 2015-03-14 DIAGNOSIS — S6991XA Unspecified injury of right wrist, hand and finger(s), initial encounter: Secondary | ICD-10-CM | POA: Diagnosis present

## 2015-03-14 DIAGNOSIS — W1839XA Other fall on same level, initial encounter: Secondary | ICD-10-CM | POA: Insufficient documentation

## 2015-03-14 DIAGNOSIS — S80211A Abrasion, right knee, initial encounter: Secondary | ICD-10-CM | POA: Insufficient documentation

## 2015-03-14 DIAGNOSIS — E119 Type 2 diabetes mellitus without complications: Secondary | ICD-10-CM | POA: Insufficient documentation

## 2015-03-14 DIAGNOSIS — S52591A Other fractures of lower end of right radius, initial encounter for closed fracture: Secondary | ICD-10-CM | POA: Insufficient documentation

## 2015-03-14 DIAGNOSIS — Z7902 Long term (current) use of antithrombotics/antiplatelets: Secondary | ICD-10-CM | POA: Insufficient documentation

## 2015-03-14 DIAGNOSIS — Y9389 Activity, other specified: Secondary | ICD-10-CM | POA: Diagnosis not present

## 2015-03-14 DIAGNOSIS — R51 Headache: Secondary | ICD-10-CM | POA: Insufficient documentation

## 2015-03-14 DIAGNOSIS — Z87891 Personal history of nicotine dependence: Secondary | ICD-10-CM | POA: Insufficient documentation

## 2015-03-14 DIAGNOSIS — Y998 Other external cause status: Secondary | ICD-10-CM | POA: Diagnosis not present

## 2015-03-14 DIAGNOSIS — Z7951 Long term (current) use of inhaled steroids: Secondary | ICD-10-CM | POA: Insufficient documentation

## 2015-03-14 DIAGNOSIS — S62101A Fracture of unspecified carpal bone, right wrist, initial encounter for closed fracture: Secondary | ICD-10-CM

## 2015-03-14 DIAGNOSIS — Z79899 Other long term (current) drug therapy: Secondary | ICD-10-CM | POA: Insufficient documentation

## 2015-03-14 DIAGNOSIS — S0990XA Unspecified injury of head, initial encounter: Secondary | ICD-10-CM | POA: Diagnosis not present

## 2015-03-14 DIAGNOSIS — S299XXA Unspecified injury of thorax, initial encounter: Secondary | ICD-10-CM | POA: Diagnosis not present

## 2015-03-14 DIAGNOSIS — S52521A Torus fracture of lower end of right radius, initial encounter for closed fracture: Secondary | ICD-10-CM | POA: Diagnosis not present

## 2015-03-14 MED ORDER — OXYCODONE-ACETAMINOPHEN 5-325 MG PO TABS
ORAL_TABLET | ORAL | Status: AC
  Administered 2015-03-14: 1 via ORAL
  Filled 2015-03-14: qty 1

## 2015-03-14 MED ORDER — OXYCODONE-ACETAMINOPHEN 5-325 MG PO TABS: 1.0000 | ORAL_TABLET | ORAL | Status: DC | PRN

## 2015-03-14 MED ORDER — OXYCODONE HCL 5 MG PO TABS
10.0000 mg | ORAL_TABLET | Freq: Once | ORAL | Status: AC
  Administered 2015-03-14: 10 mg via ORAL
  Filled 2015-03-14: qty 2

## 2015-03-14 MED ORDER — OXYCODONE-ACETAMINOPHEN 5-325 MG PO TABS
1.0000 | ORAL_TABLET | Freq: Once | ORAL | Status: AC
  Administered 2015-03-14: 1 via ORAL

## 2015-03-14 NOTE — ED Notes (Signed)
Patient was in the [place] building and sustained a fall. Patient normally walks with a cane when he lost his footing and tripped. Patient landed on his right side injuring his right chest and right arm. Patient presents with skin tears to his right arm and a deformity to his right wrist. Patient denies hitting his head or LOC.

## 2015-03-14 NOTE — Telephone Encounter (Signed)
Patient fell in parking and is now in [place] ed.  His morning labs in clinic were cancelled.

## 2015-03-14 NOTE — ED Provider Notes (Signed)
[facility] Emergency Department Provider Note    ____________________________________________  Time seen: 0835  I have reviewed the triage vital signs and the nursing notes.   HISTORY  Chief Complaint Fall   History limited by: Not Limited   HPI [name] is a 81 y.o. male who presents to the emergency department today after mechanical fall. The patient was coming to the hospital for a cholesterol blood check when his cane went into a crack on the sidewalk causing to fall over. He tried to reach out with his right hand to stop his fall. He denies hitting his head however his wife thinks he might have. He did not have any loss of consciousness. He is on Eliquis. Currently complaining primarily of pain in his right wrist and right ribs. Denies any neck pain. Denies any headache.   Past Medical History  Diagnosis Date  . Atherosclerotic heart disease of native coronary artery without angina pectoris 1998    Multivessel disease on cath for angina: S/p CABG x 4 (LIMA-LAD, SVG-RCA, SVG-OM, SVG-DIAG); SVG-Diag occluded on cath 1999  . Atherosclerotic heart disease of artery bypass graft 1999    a) Cardiac Cath : Occluded SVG-D1;; ostial LM 80-90%, diffuse LAD dz w/ D1 &2 subtotalled, LCx 70% prox with competitive flow from SVG-OM, RCA 100% occluded; patent LIMA-LAD, LC-RCA and SVG-OM; b) TM Myoview 10/2010: Ex 6.5 min - 9 METS, HR to 149, No Ischemia or infarct, PVCs; c) Echo 10/2010: EF >55%, mild AoSclerosis, Conc Remodelling.; Myoview 2/ 2016: EF 55%. Low   risk. No ischemia.  . Carotid artery occlusion without infarction 06/2006    a) High Grade L Carotid Stenosis on Doppler -> s/x L CEA; b) Carotid Doppler 10/2013:: Left CEA patent, R. ICA 60-70% stenosis  . 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  . Aortoiliac occlusive disease April 2007    Status post bilateral common iliac artery stenting  . Ischemic heart disease with chronotropic incompetence     Seen on CPET to evaluate Exertional Dyspnea. Only able to achieve 80% of peak heart rate; peak VO2 was 75% - but possibly underestimated.  . Hyperlipidemia with target LDL less than 70   . Essential hypertension   . OSA on CPAP   . Erectile dysfunction due to arterial insufficiency   . Sciatic pain     Chronic  . GERD (gastroesophageal reflux disease)   . PAF (paroxysmal atrial fibrillation) February 2016    Anticoagulated with Eliquis. On combination beta blocker and calcium channel blocker  . Colon polyps   . TIA (transient ischemic attack)   . Insomnia   . Memory loss   . Lichen planus   . Allergy   . COPD (chronic obstructive pulmonary disease)   . Osteoporosis   . Cancer     Patient Active Problem List   Diagnosis Date Noted  . Abnormal blood chemistry 02/10/2015  . Abnormal lung field 02/10/2015  . Disorder of oral mucous membrane 02/10/2015  . CN (constipation) 02/10/2015  . DD (diverticular disease) 02/10/2015  . Accumulation of fluid in tissues 02/10/2015  . Elevated WBC count 02/10/2015  . Fatigue 02/10/2015  . Hemorrhoid 02/10/2015  . Hypoxia 02/10/2015  . Cannot sleep 02/10/2015  . Lichen planus 23/55/7322  . Restless leg 02/10/2015  . Breath shortness 02/10/2015  .  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 Obstructive apnea 02/10/2015  . Temporary cerebral vascular dysfunction 02/10/2015  . B12 deficiency 02/10/2015  . SOB (shortness of breath) 12/14/2014  . Lower extremity edema 12/14/2014  . COPD (chronic obstructive pulmonary disease) 11/14/2014  . ILD (interstitial lung disease) 11/14/2014  . Confusion state 11/08/2014  . Amnesia 11/08/2014  . PAF (paroxysmal atrial fibrillation)   . Hypotension 12/09/2013  . Insomnia 12/09/2013  . OSA on CPAP   . Erectile dysfunction    . Dyspnea on exertion - -essentially resolved with BB dose reduction & wgt loss 03/29/2013  . Overweight (BMI 25.0-29.9) -- Wgt back up 01/17/2013  . Atherosclerotic heart disease of native coronary artery without angina pectoris - s/p CABG, occluded SVG-D1; patent LIMA-LAD, SVG-OM, SVG- RCA   . Essential hypertension   . Hyperlipidemia with target LDL less than 70   . Aorto-iliac disease   . Basal cell carcinoma of eyelid 01/03/2013  . Carotid artery stenosis 09/09/2012  . ED (erectile dysfunction) of organic origin 11/19/2009  . Allergic rhinitis 08/17/2009  . Awareness of heartbeats 08/17/2009  . Arthralgia of ankle or foot 06/04/2009  . Adaptation reaction 05/28/2009  . Arterial vascular disease 05/28/2009  . Benign prostatic hypertrophy without urinary obstruction 05/28/2009  . Acid reflux 05/28/2009  . Arthritis, degenerative 05/28/2009  . Need for prophylactic hormone replacement therapy (postmenopausal) 05/28/2009  . Current tobacco use 05/28/2009  . PAD (peripheral artery disease) - bilateral common iliac stents 12/15/2005    Past Surgical History  Procedure Laterality Date  . Coronary artery bypass graft  1998    LIMA-LAD, SVG-OM, SVG-RPDA, SVG-DIAG  . Cardiac catheterization  01/30/1998      total native LAD andRCA ,mod native CIRC; svg to RCA and OBTUSE MARG. PATENT the left internal mammary artery graft to LAD ; LV NORMAL  /  . Doppler echocardiography  11/20/2010    EF =>55%; LV norm mild aortic scelorosis  . Nm myocar perf wall motion  11/11/2010; February 2016    a) TM STRESS----NORMAL PERFUSION ,EF 68%; b) EF 50-55%. Normal LV function. No significant ischemia or infarction.  [name] Cpet / met  12/2012    Mild chronotropic incompetence - read 82% of predicted; also reduced effort; peak VO2 15.7 / 75% (did not reach Max effort) -- suggested ischemic response in last 1.5 minutes of exercise. Normal pulmonary function on PFTs but poor response to  . Arch aortogram and  carotid aortogram  06/02/2006    Dr Oneida Alar did surgery  . Iliac artery stent  12/15/2005    PTA and direct stenting rgt and lft common iliac arteries  . Thoracic aorta - carotid angiogram  October 2007    Dr. 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 Carotid endarterectomy Left Oct. 29, 2007    Dr. Oneida Alar:  . Ankle surgery    . Appendectomy    . Hiatal hernia repair    . Transthoracic echocardiogram  February 2016    [facility]: Normal LV function. Dilated left atrium.    Current Outpatient Rx  Name  Route  Sig  Dispense  Refill  . albuterol (PROVENTIL HFA;VENTOLIN HFA) 108 (90 BASE) MCG/ACT inhaler   Inhalation   Inhale 2 puffs into the lungs every 4 (four) hours as needed for wheezing or shortness of breath.   1 Inhaler   4   . apixaban (ELIQUIS) 5 MG TABS tablet   Oral  Take 1 tablet (5 mg total) by mouth 2 (two) times daily.   60 tablet   3   . Ascorbic Acid (VITAMIN C) 1000 MG tablet   Oral   Take 1,000 mg by mouth daily.         . Biotin 1000 MCG tablet   Oral   Take 1,000 mcg by mouth daily.         . Cyanocobalamin 1000 MCG TBCR   Oral   Take 1,000 mcg by mouth daily.          [name] DEXILANT 60 MG capsule      TAKE ONE CAPSULE BY MOUTH EVERY DAY   30 capsule   11   . finasteride (PROSCAR) 5 MG tablet   Oral   Take 5 mg by mouth daily.         . Fluticasone-Salmeterol (ADVAIR) 250-50 MCG/DOSE AEPB   Inhalation   Inhale 1 puff into the lungs 2 (two) times daily.   60 each   5   . furosemide (LASIX) 20 MG tablet   Oral   Take 1 tablet (20 mg total) by mouth as needed (as needed for swelling).   30 tablet   3   . lisinopril (PRINIVIL,ZESTRIL) 2.5 MG tablet   Oral   Take 1 tablet (2.5 mg total) by mouth 2 (two) times daily.   180 tablet   6   . magnesium oxide (MAG-OX) 400 MG tablet   Oral   Take 400 mg by mouth daily.         . metoprolol tartrate  (LOPRESSOR) 25 MG tablet   Oral   Take 25 mg by mouth 2 (two) times daily.          . nitroGLYCERIN (NITROSTAT) 0.4 MG SL tablet   Sublingual   Place 1 tablet (0.4 mg total) under the tongue every 5 (five) minutes as needed for chest pain.   25 tablet   3   . nortriptyline (PAMELOR) 10 MG capsule      As needed for insomnia   15 capsule   0   . Omega-3 Fatty Acids (FISH OIL PO)   Oral   Take 700 Units by mouth daily.         . rosuvastatin (CRESTOR) 20 MG tablet   Oral   Take 20 mg by mouth daily.         . tamsulosin (FLOMAX) 0.4 MG CAPS capsule   Oral   Take 0.4 mg by mouth daily.         [name] ZETIA 10 MG tablet      TAKE ONE TABLET BY MOUTH EVERY DAY   30 tablet   11     Allergies Clonazepam; Codeine; and Niacin and related  Family History  Problem Relation Age of Onset  . Ulcers Mother     Peptic  . Dementia Mother   . Alcohol abuse Father     Social History History  Substance Use Topics  . Smoking status: Former Smoker    Types: Cigarettes    Quit date: 08/04/1997  . Smokeless tobacco: Never Used  . Alcohol Use: No    Review of Systems  Constitutional: Negative for fever. Cardiovascular: Negative for chest pain. Respiratory: Negative for shortness of breath. Gastrointestinal: Negative for abdominal pain, vomiting and diarrhea. Genitourinary: Negative for dysuria. Musculoskeletal: Negative for back pain. Right rib and right wrist pain Skin: Abrasion over the right knee, skin tear over the right forearm  Neurological: Negative for headaches, focal weakness or numbness.  10-point ROS otherwise negative.  ____________________________________________   PHYSICAL EXAM:  VITAL SIGNS: ED Triage Vitals  Enc Vitals Group     BP 03/14/15 0832 111/67 mmHg     Pulse Rate 03/14/15 0832 61     Resp 03/14/15 0832 18     Temp 03/14/15 0832 97.7 F (36.5 C)     Temp Source 03/14/15 0832 Oral     SpO2 03/14/15 0832 93 %     Weight 03/14/15  0832 222 lb (100.699 kg)     Height 03/14/15 0832 6' (1.829 m)     Head Cir --      Peak Flow --      Pain Score 03/14/15 0833 8   Constitutional: Alert and oriented. Well appearing and in no distress. Eyes: Conjunctivae are normal. PERRL. Normal extraocular movements. ENT   Head: Normocephalic and atraumatic. No hematomas, abrasions or hemotympanum.   Nose: No congestion/rhinnorhea.   Mouth/Throat: Mucous membranes are moist.   Neck: No stridor. No midline tenderness. Painless range of motion. Hematological/Lymphatic/Immunilogical: No cervical lymphadenopathy. Cardiovascular: Normal rate, regular rhythm.  No murmurs, rubs, or gallops. Respiratory: Normal respiratory effort without tachypnea nor retractions. Breath sounds are clear and equal bilaterally. No wheezes/rales/rhonchi. Gastrointestinal: Soft and nontender. No distention. There is no CVA tenderness. Genitourinary: Deferred Musculoskeletal: Right wrist with obvious deformity and tenderness. Neurovascularly intact. Neurologic:  Normal speech and language. No gross focal neurologic deficits are appreciated. Speech is normal.  Skin:  Skin is warm, dry. Large skin tear to dorsum of right forearm. Small abrasions to right knee. Psychiatric: Mood and affect are normal. Speech and behavior are normal. Patient exhibits appropriate insight and judgment.  ____________________________________________    LABS (pertinent positives/negatives)  None  ____________________________________________   EKG  None  ____________________________________________    RADIOLOGY  CT head IMPRESSION: Atrophy with patchy periventricular small vessel disease. No intracranial mass, hemorrhage, or extra-axial fluid collection. No acute appearing infarct.  Right wrist  IMPRESSION: 1. Comminuted transverse distal metaphyseal radial fracture with apex anterior angulation and intra-articular extension into the distal radial  articular surface. 2. Transverse fracture, ulnar styloid. 3. Prominent osteoarthritis at the first carpometacarpal Articulation.  Rib x-rays IMPRESSION: Negative. ____________________________________________   PROCEDURES  Procedure(s) performed: post splint check, see procedure note(s).  Critical Care performed: No  POST SPLINT CHECK Splint applied by tech.  Good position.  Distally N/V intact, sensation intact. No discoloration.  ____________________________________________   INITIAL IMPRESSION / ASSESSMENT AND PLAN / ED COURSE  Pertinent labs & imaging results that were available during my care of the patient were reviewed by me and considered in my medical decision making (see chart for details).  Patient presents to the emergency department today with primary complaint of right wrist pain and right chest pain after mechanical fall. Right wrist x-ray does show a comminuted fracture. This was splinted. I discussed with Dr. Rudene Christians with orthopedics who stated he would be able to see the patient in his office today. Will discharge home with pain medications and follow-up with orthopedics.  ____________________________________________   FINAL CLINICAL IMPRESSION(S) / ED DIAGNOSES  Final diagnoses:  Pain  Wrist fracture, right, closed, initial encounter  Rib contusion, right, initial encounter     Nance Pear, MD 03/14/15 1042

## 2015-03-14 NOTE — Discharge Instructions (Signed)
Please seek medical attention for any high fevers, chest pain, shortness of breath, change in behavior, persistent vomiting, bloody stool or any other new or concerning symptoms.  Wrist Fracture A wrist fracture is a break or crack in one of the bones of your wrist. Your wrist is made up of eight small bones at the palm of your hand (carpal bones) and two long bones that make up your forearm (radius and ulna).  CAUSES   A direct blow to the wrist.  Falling on an outstretched hand.  Trauma, such as a car accident or a fall. RISK FACTORS Risk factors for wrist fracture include:   Participating in contact and high-risk sports, such as skiing, biking, and ice skating.  Taking steroid medicines.  Smoking.  Being male.  Being Caucasian.  Drinking more than three alcoholic beverages per day.  Having low or lowered bone density (osteoporosis or osteopenia).  Age. Older adults have decreased bone density.  Women who have had menopause.  History of previous fractures. SIGNS AND SYMPTOMS Symptoms of wrist fractures include tenderness, bruising, and inflammation. Additionally, the wrist may hang in an odd position or appear deformed.  DIAGNOSIS Diagnosis may include:  Physical exam.  X-ray. TREATMENT Treatment depends on many factors, including the nature and location of the fracture, your age, and your activity level. Treatment for wrist fracture can be nonsurgical or surgical.  Nonsurgical Treatment A plaster cast or splint may be applied to your wrist if the bone is in a good position. If the fracture is not in good position, it may be necessary for your health care provider to realign it before applying a splint or cast. Usually, a cast or splint will be worn for several weeks.  Surgical Treatment Sometimes the position of the bone is so far out of place that surgery is required to apply a device to hold it together as it heals. Depending on the fracture, there are a number of  options for holding the bone in place while it heals, such as a cast and metal pins.  HOME CARE INSTRUCTIONS  Keep your injured wrist elevated and move your fingers as much as possible.  Do not put pressure on any part of your cast or splint. It may break.   Use a plastic bag to protect your cast or splint from water while bathing or showering. Do not lower your cast or splint into water.  Take medicines only as directed by your health care provider.  Keep your cast or splint clean and dry. If it becomes wet, damaged, or suddenly feels too tight, contact your health care provider right away.  Do not use any tobacco products including cigarettes, chewing tobacco, or electronic cigarettes. Tobacco can delay bone healing. If you need help quitting, ask your health care provider.  Keep all follow-up visits as directed by your health care provider. This is important.  Ask your health care provider if you should take supplements of calcium and vitamins C and D to promote bone healing. SEEK MEDICAL CARE IF:   Your cast or splint is damaged, breaks, or gets wet.  You have a fever.  You have chills.  You have continued severe pain or more swelling than you did before the cast was put on. SEEK IMMEDIATE MEDICAL CARE IF:   Your hand or fingernails on the injured arm turn blue or gray, or feel cold or numb.  You have decreased feeling in the fingers of your injured arm. MAKE SURE YOU:  Understand these instructions.  Will watch your condition.  Will get help right away if you are not doing well or get worse. Document Released: 05/28/2005 Document Revised: 01/02/2014 Document Reviewed: 09/05/2011 [facility] Patient Information 2015 [place], [place]. This information is not intended to replace advice given to you by your health care provider. Make sure you discuss any questions you have with your health care provider.

## 2015-03-15 ENCOUNTER — Telehealth: Payer: Self-pay | Admitting: Cardiology

## 2015-03-15 ENCOUNTER — Encounter
Admission: RE | Admit: 2015-03-15 | Discharge: 2015-03-15 | Disposition: A | Discharge status: Home / Self Care | Payer: Medicare Other | Source: Ambulatory Visit | Attending: Orthopedic Surgery | Admitting: Orthopedic Surgery

## 2015-03-15 ENCOUNTER — Other Ambulatory Visit (INDEPENDENT_AMBULATORY_CARE_PROVIDER_SITE_OTHER): Payer: Medicare Other

## 2015-03-15 DIAGNOSIS — X58XXXA Exposure to other specified factors, initial encounter: Secondary | ICD-10-CM | POA: Insufficient documentation

## 2015-03-15 DIAGNOSIS — I2510 Atherosclerotic heart disease of native coronary artery without angina pectoris: Secondary | ICD-10-CM

## 2015-03-15 DIAGNOSIS — S62101A Fracture of unspecified carpal bone, right wrist, initial encounter for closed fracture: Secondary | ICD-10-CM | POA: Insufficient documentation

## 2015-03-15 DIAGNOSIS — Z01812 Encounter for preprocedural laboratory examination: Secondary | ICD-10-CM | POA: Insufficient documentation

## 2015-03-15 LAB — DIFFERENTIAL
Basophils Absolute: 0.1 10*3/uL (ref 0–0.1)
Basophils Relative: 1 %
Eosinophils Absolute: 0.1 10*3/uL (ref 0–0.7)
Eosinophils Relative: 1 %
Lymphocytes Relative: 12 %
Lymphs Abs: 1.3 10*3/uL (ref 1.0–3.6)
Monocytes Absolute: 1.4 10*3/uL — ABNORMAL HIGH (ref 0.2–1.0)
Monocytes Relative: 12 %
Neutro Abs: 8.3 10*3/uL — ABNORMAL HIGH (ref 1.4–6.5)
Neutrophils Relative %: 74 %

## 2015-03-15 LAB — CBC
HCT: 42.8 % (ref 40.0–52.0)
Hemoglobin: 14.2 g/dL (ref 13.0–18.0)
MCH: 31 pg (ref 26.0–34.0)
MCHC: 33.1 g/dL (ref 32.0–36.0)
MCV: 93.5 fL (ref 80.0–100.0)
Platelets: 135 10*3/uL — ABNORMAL LOW (ref 150–440)
RBC: 4.58 MIL/uL (ref 4.40–5.90)
RDW: 13.9 % (ref 11.5–14.5)
WBC: 11.1 10*3/uL — ABNORMAL HIGH (ref 3.8–10.6)

## 2015-03-15 LAB — COMPREHENSIVE METABOLIC PANEL
ALT: 16 U/L — ABNORMAL LOW (ref 17–63)
AST: 19 U/L (ref 15–41)
Albumin: 3.8 g/dL (ref 3.5–5.0)
Alkaline Phosphatase: 70 U/L (ref 38–126)
Anion gap: 9 (ref 5–15)
BUN: 17 mg/dL (ref 6–20)
CO2: 26 mmol/L (ref 22–32)
Calcium: 8.9 mg/dL (ref 8.9–10.3)
Chloride: 103 mmol/L (ref 101–111)
Creatinine, Ser: 1.02 mg/dL (ref 0.61–1.24)
GFR calc Af Amer: >60 mL/min (ref >60)
GFR calc non Af Amer: >60 mL/min (ref >60)
Glucose, Bld: 109 mg/dL — ABNORMAL HIGH (ref 65–99)
Potassium: 3.8 mmol/L (ref 3.5–5.1)
Sodium: 138 mmol/L (ref 135–145)
Total Bilirubin: 2 mg/dL — ABNORMAL HIGH (ref 0.3–1.2)
Total Protein: 6.5 g/dL (ref 6.5–8.1)

## 2015-03-15 NOTE — Telephone Encounter (Signed)
That will be great. 

## 2015-03-15 NOTE — Telephone Encounter (Signed)
[name] in Pre admit is drawing patient labs for pre op .   She will draw the extra yellow tube that we need for his lipid panel and send labeled down to clinic.  Wants to prevent patient from multiple sticks/ blood draws.

## 2015-03-15 NOTE — Patient Instructions (Signed)
  Your procedure is scheduled [id number] March 19, 2015 Report to Same Day Surgery. To find out your arrival time please call [phone] between 1PM - 3PM on Friday March 16, 2015.  Remember: Instructions that are not followed completely may result in serious medical risk, up to and including death, or upon the discretion of your surgeon and anesthesiologist your surgery may need to be rescheduled.    _x___ 1. Do not eat food or drink liquids after midnight. No gum chewing or hard candies.     ____ 2. No Alcohol for 24 hours before or after surgery.   ____ 3. Bring all medications with you on the day of surgery if instructed.    __x__ 4. Notify your doctor if there is any change in your medical condition     (cold, fever, infections).     Do not wear jewelry, make-up, hairpins, clips or nail polish.  Do not wear lotions, powders, or perfumes. You may wear deodorant.  Do not shave 48 hours prior to surgery. Men may shave face and neck.  Do not bring valuables to the hospital.    [facility] is not responsible for any belongings or valuables.               Contacts, dentures or bridgework may not be worn into surgery.  Leave your suitcase in the car. After surgery it may be brought to your room.  For patients admitted to the hospital, discharge time is determined by your   treatment team.   Patients discharged the day of surgery will not be allowed to drive home.    Please read over the following fact sheets that you were given:   [facility] Preparing for Surgery  _x__ Take these medicines the morning of surgery with A SIP OF WATER:    1. DEXILANT   2. lisinopril (PRINIVIL,ZESTRIL)   3. magnesium oxide   4.metoprolol tartrate (LOPRESSOR)  5.rosuvastatin (CRESTOR)     ____ Fleet Enema (as directed)   _x___ Use Sage Wipes as directed  _x___ Use inhalers on the day of surgery  ____ Stop metformin 2 days prior to surgery    ____ Take 1/2 of usual insulin dose the night  before surgery and none on the morning of surgery.   ____Already  stopped Eliquis on March 14, 2015 pm.  __x__ Stop Anti-inflammatories on today.  OK to take tylenol or pain med.  __x__ Stop supplements Biotin, fish oil until after surgery.    __x__ Bring C-Pap to the hospital.

## 2015-03-16 LAB — COMPREHENSIVE METABOLIC PANEL
ALT: 15 IU/L (ref 0–44)
AST: 18 IU/L (ref 0–40)
Albumin/Globulin Ratio: 2.2 (ref 1.1–2.5)
Albumin: 4.1 g/dL (ref 3.5–4.8)
Alkaline Phosphatase: 78 IU/L (ref 39–117)
BUN/Creatinine Ratio: 15 (ref 10–22)
BUN: 16 mg/dL (ref 8–27)
Bilirubin Total: 1.8 mg/dL — ABNORMAL HIGH (ref 0.0–1.2)
CO2: 18 mmol/L (ref 18–29)
Calcium: 9 mg/dL (ref 8.6–10.2)
Chloride: 103 mmol/L (ref 97–108)
Creatinine, Ser: 1.06 mg/dL (ref 0.76–1.27)
GFR calc Af Amer: 77 mL/min/{1.73_m2} (ref >59)
GFR calc non Af Amer: 66 mL/min/{1.73_m2} (ref >59)
Globulin, Total: 1.9 g/dL (ref 1.5–4.5)
Glucose: 105 mg/dL — ABNORMAL HIGH (ref 65–99)
Potassium: 4.1 mmol/L (ref 3.5–5.2)
Sodium: 142 mmol/L (ref 134–144)
Total Protein: 6 g/dL (ref 6.0–8.5)

## 2015-03-16 LAB — LIPID PANEL
Chol/HDL Ratio: 3.4 ratio units (ref 0.0–5.0)
Cholesterol, Total: 106 mg/dL (ref 100–199)
HDL: 31 mg/dL — ABNORMAL LOW (ref >39)
LDL Calculated: 48 mg/dL (ref 0–99)
Triglycerides: 136 mg/dL (ref 0–149)
VLDL Cholesterol Cal: 27 mg/dL (ref 5–40)

## 2015-03-17 DIAGNOSIS — M1990 Unspecified osteoarthritis, unspecified site: Secondary | ICD-10-CM | POA: Diagnosis not present

## 2015-03-17 DIAGNOSIS — J849 Interstitial pulmonary disease, unspecified: Secondary | ICD-10-CM | POA: Diagnosis not present

## 2015-03-17 DIAGNOSIS — I2510 Atherosclerotic heart disease of native coronary artery without angina pectoris: Secondary | ICD-10-CM | POA: Diagnosis not present

## 2015-03-17 DIAGNOSIS — J449 Chronic obstructive pulmonary disease, unspecified: Secondary | ICD-10-CM | POA: Diagnosis not present

## 2015-03-17 DIAGNOSIS — S52611D Displaced fracture of right ulna styloid process, subsequent encounter for closed fracture with routine healing: Secondary | ICD-10-CM | POA: Diagnosis not present

## 2015-03-17 DIAGNOSIS — S52521D Torus fracture of lower end of right radius, subsequent encounter for fracture with routine healing: Secondary | ICD-10-CM | POA: Diagnosis not present

## 2015-03-19 ENCOUNTER — Ambulatory Visit: Payer: Medicare Other | Admitting: Certified Registered Nurse Anesthetist

## 2015-03-19 ENCOUNTER — Ambulatory Visit
Admission: RE | Admit: 2015-03-19 | Discharge: 2015-03-19 | Disposition: A | Discharge status: Home / Self Care | Payer: Medicare Other | Source: Ambulatory Visit | Attending: Orthopedic Surgery | Admitting: Orthopedic Surgery

## 2015-03-19 ENCOUNTER — Encounter: Payer: Self-pay | Admitting: Anesthesiology

## 2015-03-19 ENCOUNTER — Encounter
Admission: RE | Disposition: A | Discharge status: Home / Self Care | Payer: Self-pay | Source: Ambulatory Visit | Attending: Orthopedic Surgery

## 2015-03-19 DIAGNOSIS — Z7901 Long term (current) use of anticoagulants: Secondary | ICD-10-CM | POA: Insufficient documentation

## 2015-03-19 DIAGNOSIS — G5601 Carpal tunnel syndrome, right upper limb: Secondary | ICD-10-CM | POA: Diagnosis not present

## 2015-03-19 DIAGNOSIS — Z7951 Long term (current) use of inhaled steroids: Secondary | ICD-10-CM | POA: Diagnosis not present

## 2015-03-19 DIAGNOSIS — Z955 Presence of coronary angioplasty implant and graft: Secondary | ICD-10-CM | POA: Insufficient documentation

## 2015-03-19 DIAGNOSIS — G4730 Sleep apnea, unspecified: Secondary | ICD-10-CM | POA: Diagnosis not present

## 2015-03-19 DIAGNOSIS — I4891 Unspecified atrial fibrillation: Secondary | ICD-10-CM | POA: Diagnosis not present

## 2015-03-19 DIAGNOSIS — S52591D Other fractures of lower end of right radius, subsequent encounter for closed fracture with routine healing: Secondary | ICD-10-CM | POA: Diagnosis not present

## 2015-03-19 DIAGNOSIS — Z8489 Family history of other specified conditions: Secondary | ICD-10-CM | POA: Diagnosis not present

## 2015-03-19 DIAGNOSIS — S52501A Unspecified fracture of the lower end of right radius, initial encounter for closed fracture: Secondary | ICD-10-CM | POA: Diagnosis not present

## 2015-03-19 DIAGNOSIS — Z888 Allergy status to other drugs, medicaments and biological substances status: Secondary | ICD-10-CM | POA: Insufficient documentation

## 2015-03-19 DIAGNOSIS — Z885 Allergy status to narcotic agent status: Secondary | ICD-10-CM | POA: Diagnosis not present

## 2015-03-19 DIAGNOSIS — I2510 Atherosclerotic heart disease of native coronary artery without angina pectoris: Secondary | ICD-10-CM | POA: Diagnosis not present

## 2015-03-19 DIAGNOSIS — N400 Enlarged prostate without lower urinary tract symptoms: Secondary | ICD-10-CM | POA: Diagnosis not present

## 2015-03-19 DIAGNOSIS — Z9889 Other specified postprocedural states: Secondary | ICD-10-CM

## 2015-03-19 DIAGNOSIS — W19XXXA Unspecified fall, initial encounter: Secondary | ICD-10-CM | POA: Insufficient documentation

## 2015-03-19 DIAGNOSIS — S50811A Abrasion of right forearm, initial encounter: Secondary | ICD-10-CM | POA: Insufficient documentation

## 2015-03-19 DIAGNOSIS — Z8781 Personal history of (healed) traumatic fracture: Secondary | ICD-10-CM

## 2015-03-19 DIAGNOSIS — J449 Chronic obstructive pulmonary disease, unspecified: Secondary | ICD-10-CM | POA: Diagnosis not present

## 2015-03-19 DIAGNOSIS — Z79899 Other long term (current) drug therapy: Secondary | ICD-10-CM | POA: Insufficient documentation

## 2015-03-19 DIAGNOSIS — S62101A Fracture of unspecified carpal bone, right wrist, initial encounter for closed fracture: Secondary | ICD-10-CM | POA: Diagnosis not present

## 2015-03-19 DIAGNOSIS — S52521A Torus fracture of lower end of right radius, initial encounter for closed fracture: Secondary | ICD-10-CM | POA: Diagnosis not present

## 2015-03-19 DIAGNOSIS — S52611A Displaced fracture of right ulna styloid process, initial encounter for closed fracture: Secondary | ICD-10-CM | POA: Diagnosis not present

## 2015-03-19 DIAGNOSIS — I10 Essential (primary) hypertension: Secondary | ICD-10-CM | POA: Diagnosis not present

## 2015-03-19 HISTORY — PX: FRACTURE SURGERY: SHX138

## 2015-03-19 HISTORY — PX: ORIF WRIST FRACTURE: SHX2133

## 2015-03-19 SURGERY — OPEN REDUCTION INTERNAL FIXATION (ORIF) WRIST FRACTURE
Anesthesia: General | Laterality: Right | Wound class: Clean

## 2015-03-19 MED ORDER — OXYCODONE-ACETAMINOPHEN 5-325 MG PO TABS: 1.0000 | ORAL_TABLET | Freq: Four times a day (QID) | ORAL | Status: DC | PRN

## 2015-03-19 MED ORDER — BUPIVACAINE HCL 0.5 % IJ SOLN
INTRAMUSCULAR | Status: DC | PRN
  Administered 2015-03-19: 10 mL

## 2015-03-19 MED ORDER — LIDOCAINE HCL (CARDIAC) 20 MG/ML IV SOLN
INTRAVENOUS | Status: DC | PRN
  Administered 2015-03-19: 60 mg via INTRAVENOUS

## 2015-03-19 MED ORDER — NEOMYCIN-POLYMYXIN B GU 40-200000 IR SOLN
Status: AC
  Filled 2015-03-19: qty 2

## 2015-03-19 MED ORDER — BUPIVACAINE HCL (PF) 0.5 % IJ SOLN
INTRAMUSCULAR | Status: AC
  Filled 2015-03-19: qty 30

## 2015-03-19 MED ORDER — METOCLOPRAMIDE HCL 10 MG PO TABS: 5.0000 mg | ORAL_TABLET | Freq: Three times a day (TID) | ORAL | Status: DC | PRN

## 2015-03-19 MED ORDER — PHENYLEPHRINE HCL 10 MG/ML IJ SOLN
INTRAMUSCULAR | Status: DC | PRN
  Administered 2015-03-19 (×7): 100 ug via INTRAVENOUS
  Administered 2015-03-19 (×2): 200 ug via INTRAVENOUS
  Administered 2015-03-19: 100 ug via INTRAVENOUS
  Administered 2015-03-19: 200 ug via INTRAVENOUS
  Administered 2015-03-19 (×2): 100 ug via INTRAVENOUS

## 2015-03-19 MED ORDER — METOPROLOL TARTRATE 25 MG PO TABS: 25.0000 mg | ORAL_TABLET | Freq: Once | ORAL | Status: DC

## 2015-03-19 MED ORDER — ONDANSETRON HCL 4 MG PO TABS: 4.0000 mg | ORAL_TABLET | Freq: Four times a day (QID) | ORAL | Status: DC | PRN

## 2015-03-19 MED ORDER — CEFAZOLIN SODIUM-DEXTROSE 2-3 GM-% IV SOLR
INTRAVENOUS | Status: AC
  Filled 2015-03-19: qty 50

## 2015-03-19 MED ORDER — FENTANYL CITRATE (PF) 100 MCG/2ML IJ SOLN
INTRAMUSCULAR | Status: DC | PRN
  Administered 2015-03-19: 25 ug via INTRAVENOUS
  Administered 2015-03-19: 50 ug via INTRAVENOUS
  Administered 2015-03-19: 25 ug via INTRAVENOUS

## 2015-03-19 MED ORDER — GLYCOPYRROLATE 0.2 MG/ML IJ SOLN
INTRAMUSCULAR | Status: DC | PRN
  Administered 2015-03-19: 0.2 mg via INTRAVENOUS

## 2015-03-19 MED ORDER — SODIUM CHLORIDE 0.9 % IV SOLN: INTRAVENOUS | Status: DC

## 2015-03-19 MED ORDER — FENTANYL CITRATE (PF) 100 MCG/2ML IJ SOLN
25.0000 ug | INTRAMUSCULAR | Status: DC | PRN
  Administered 2015-03-19 (×2): 25 ug via INTRAVENOUS

## 2015-03-19 MED ORDER — ACETAMINOPHEN 10 MG/ML IV SOLN
INTRAVENOUS | Status: DC | PRN
  Administered 2015-03-19: 1000 mg via INTRAVENOUS

## 2015-03-19 MED ORDER — LACTATED RINGERS IV SOLN
INTRAVENOUS | Status: DC
  Administered 2015-03-19 (×3): via INTRAVENOUS

## 2015-03-19 MED ORDER — CEFAZOLIN SODIUM-DEXTROSE 2-3 GM-% IV SOLR
2.0000 g | Freq: Once | INTRAVENOUS | Status: AC
  Administered 2015-03-19: 16:00:00 via INTRAVENOUS

## 2015-03-19 MED ORDER — ACETAMINOPHEN 10 MG/ML IV SOLN
INTRAVENOUS | Status: AC
  Filled 2015-03-19: qty 100

## 2015-03-19 MED ORDER — METOCLOPRAMIDE HCL 5 MG/ML IJ SOLN: 5.0000 mg | Freq: Three times a day (TID) | INTRAMUSCULAR | Status: DC | PRN

## 2015-03-19 MED ORDER — OXYCODONE-ACETAMINOPHEN 5-325 MG PO TABS: 1.0000 | ORAL_TABLET | ORAL | Status: DC | PRN

## 2015-03-19 MED ORDER — FENTANYL CITRATE (PF) 100 MCG/2ML IJ SOLN
INTRAMUSCULAR | Status: AC
  Administered 2015-03-19: 25 ug via INTRAVENOUS
  Filled 2015-03-19: qty 2

## 2015-03-19 MED ORDER — MIDAZOLAM HCL 2 MG/2ML IJ SOLN
INTRAMUSCULAR | Status: DC | PRN
  Administered 2015-03-19: 2 mg via INTRAVENOUS

## 2015-03-19 MED ORDER — METOPROLOL TARTRATE 25 MG PO TABS
ORAL_TABLET | ORAL | Status: AC
  Filled 2015-03-19: qty 1

## 2015-03-19 MED ORDER — DEXAMETHASONE SODIUM PHOSPHATE 4 MG/ML IJ SOLN
INTRAMUSCULAR | Status: DC | PRN
  Administered 2015-03-19: 4 mg via INTRAVENOUS

## 2015-03-19 MED ORDER — PROPOFOL 10 MG/ML IV BOLUS
INTRAVENOUS | Status: DC | PRN
  Administered 2015-03-19: 120 mg via INTRAVENOUS

## 2015-03-19 MED ORDER — ONDANSETRON HCL 4 MG/2ML IJ SOLN: 4.0000 mg | Freq: Once | INTRAMUSCULAR | Status: DC | PRN

## 2015-03-19 MED ORDER — CEFAZOLIN SODIUM-DEXTROSE 2-3 GM-% IV SOLR
2.0000 g | Freq: Once | INTRAVENOUS | Status: AC
  Administered 2015-03-19: 2 g via INTRAVENOUS

## 2015-03-19 MED ORDER — NEOMYCIN-POLYMYXIN B GU 40-200000 IR SOLN
Status: DC | PRN
  Administered 2015-03-19: 2 mL

## 2015-03-19 MED ORDER — ONDANSETRON HCL 4 MG/2ML IJ SOLN: 4.0000 mg | Freq: Four times a day (QID) | INTRAMUSCULAR | Status: DC | PRN

## 2015-03-19 SURGICAL SUPPLY — 52 items
BANDAGE ELASTIC 4 CLIP NS LF (GAUZE/BANDAGES/DRESSINGS) ×2 IMPLANT
BNDG ESMARK 4X12 TAN STRL LF (GAUZE/BANDAGES/DRESSINGS) ×2 IMPLANT
CANISTER SUCT 1200ML W/VALVE (MISCELLANEOUS) ×2 IMPLANT
CAST PADDING 3X4FT ST 30246 (SOFTGOODS) ×1
CHLORAPREP W/TINT 26ML (MISCELLANEOUS) ×1 IMPLANT
CORD BIP STRL DISP 12FT (MISCELLANEOUS) ×2 IMPLANT
Cortical Screw-purple ×1 IMPLANT
Cortical screws- purple ×2 IMPLANT
DRAPE FLUOR MINI C-ARM 54X84 (DRAPES) ×2 IMPLANT
DRSG EMULSION OIL 3X8 NADH (GAUZE/BANDAGES/DRESSINGS) ×3 IMPLANT
DURAPREP 6ML APPLICATOR 50/CS (WOUND CARE) ×2 IMPLANT
ELECT CAUTERY BLADE 6.4 (BLADE) ×2 IMPLANT
FORCEPS JEWEL BIP 4-3/4 STR (INSTRUMENTS) ×2 IMPLANT
GAUZE PETRO XEROFOAM 1X8 (MISCELLANEOUS) ×1 IMPLANT
GAUZE SPONGE 4X4 12PLY STRL (GAUZE/BANDAGES/DRESSINGS) ×6 IMPLANT
GLOVE BIOGEL PI IND STRL 9 (GLOVE) ×1 IMPLANT
GLOVE BIOGEL PI INDICATOR 9 (GLOVE) ×1
GLOVE SURG ORTHO 9.0 STRL STRW (GLOVE) ×2 IMPLANT
GOWN SPECIALTY ULTRA XL (MISCELLANEOUS) ×2 IMPLANT
GOWN STRL REUS W/ TWL LRG LVL3 (GOWN DISPOSABLE) ×1 IMPLANT
GOWN STRL REUS W/TWL 2XL LVL3 (GOWN DISPOSABLE) ×2 IMPLANT
GOWN STRL REUS W/TWL LRG LVL3 (GOWN DISPOSABLE) ×2
K-WIRE 1.6 (WIRE) ×2
K-WIRE FX5X1.6XNS BN SS (WIRE) ×1
KIT RM TURNOVER STRD PROC AR (KITS) ×2 IMPLANT
KWIRE FX5X1.6XNS BN SS (WIRE) IMPLANT
NDL SAFETY 25GX1.5 (NEEDLE) ×2 IMPLANT
NS IRRIG 500ML POUR BTL (IV SOLUTION) ×2 IMPLANT
PACK EXTREMITY ARMC (MISCELLANEOUS) ×2 IMPLANT
PAD CAST CTTN 3X4 STRL (SOFTGOODS) ×1 IMPLANT
PAD CAST CTTN 4X4 STRL (SOFTGOODS) ×1 IMPLANT
PAD GROUND ADULT SPLIT (MISCELLANEOUS) ×2 IMPLANT
PADDING CAST COTTON 3X4 STRL (SOFTGOODS) ×1
PADDING CAST COTTON 4X4 STRL (SOFTGOODS) ×2
PEG SUBCHONDRAL SMOOTH 2.0X16 (Peg) ×1 IMPLANT
PEG SUBCHONDRAL SMOOTH 2.0X22 (Peg) ×3 IMPLANT
PEG SUBCHONDRAL SMOOTH 2.0X24 (Peg) ×1 IMPLANT
PLATE SHORT 24.4X51.3 RT (Plate) ×1 IMPLANT
SCREW BN 12X3.5XNS CORT TI (Screw) IMPLANT
SCREW CORT 3.5X12 (Screw) ×2 IMPLANT
SCREW CORT 3.5X14 LNG (Screw) ×2 IMPLANT
SPLINT CAST 1 STEP 3X12 (MISCELLANEOUS) ×2 IMPLANT
STOCKINETTE 48X4 2 PLY STRL (GAUZE/BANDAGES/DRESSINGS) ×1 IMPLANT
STOCKINETTE STRL 3IN 960336 (SOFTGOODS) ×2 IMPLANT
STOCKINETTE STRL 4IN 9604848 (GAUZE/BANDAGES/DRESSINGS) ×2 IMPLANT
SUT ETHILON 4 0 P 3 18 (SUTURE) ×3 IMPLANT
SUT VIC AB 3-0 SH 27 (SUTURE) ×2
SUT VIC AB 3-0 SH 27X BRD (SUTURE) ×1 IMPLANT
SYRINGE 10CC LL (SYRINGE) ×2 IMPLANT
Stainless Steel K wire ×1 IMPLANT
p24 ×1 IMPLANT
smooth pegs-blue ×1 IMPLANT

## 2015-03-19 NOTE — Op Note (Signed)
03/19/2015  2:41 PM  PATIENT:  [name]  81 y.o. male  PRE-OPERATIVE DIAGNOSIS:  right wrist fracture, carpal tunnel syndrome  POST-OPERATIVE DIAGNOSIS:  right wrist fracture, carpal tunnel syndrome  PROCEDURE:  Procedure(s): OPEN REDUCTION INTERNAL FIXATION (ORIF) WRIST FRACTURE (Right), right carpal tunnel release  SURGEON: Laurene Footman, MD  ASSISTANTS: None  ANESTHESIA:   general  EBL:  Total I/O In: 1000 [I.V.:1000] Out: 10 [Blood:10]  BLOOD ADMINISTERED:none  DRAINS: none   LOCAL MEDICATIONS USED:  MARCAINE     SPECIMEN:  No Specimen  DISPOSITION OF SPECIMEN:  N/A  COUNTS:  YES  TOURNIQUET:   46 minutes at 250 mmHg  IMPLANTS: [employer] innovations short standard volar plate with multiple pegs and screws  DICTATION: .Dragon Dictation patient was brought to the operating room and after adequate general anesthesia was obtained, the right arm was prepped and draped in the sterile fashion was turned by the right upper arm. After patient education and timeout procedure completed having prepped and draped the arm traction was applied to the index finger with 10 pounds of traction applied. Tourniquet was then raised and volar approach is made centered over the FCR tendon. FCR tendon sheath was incised and the FCR retracted radially protecting the radial artery and vessels. Deep pronator was elevated off the distal radius. Traction and restored length at the dorsal tilt was still severely displaced along the styloid fragment. After elevating soft tissue off the radial styloid it was able to be reduced and a distal first plating technique was performed with the plate placed at the appropriate level and pinned in place. The distal most screw peg holes were filled using standard technique drilling and placing the smooth pegs. The plate was then fixed to the shaft with 3 cortical screws. Mini C-arm views showed no penetration of the joint and essentially anatomic alignment with  restoration of radial tilt and volar tilt and length. The carpal tunnel releases and carried out through approximately 1 inch incision over the FCR was centered over the in line with the ring metacarpal and over the carpal tunnel. The deep subcutaneous tissue was spread and the transverse carpal ligament identified a small opening made in a fashion hemostat placed to 200 structures. This carried up proximally and then distally with good release of the nerve with no apparent compression remaining. The wounds were infiltrated with 20 cc total of 5% Sensorcaine without epinephrine and the wound closed with 4-0 nylon for the skin 3-0 Vicryl for the wrist fracture incision. Xeroform 4 x 4's web roll and Ace wrap were applied with Adaptic applied to a skin tear on the dorsum of the forearm volar splint with Ace wrap were applied. Patient center comes stable condition  PLAN OF CARE: Discharge to home after PACU  PATIENT DISPOSITION:  PACU - hemodynamically stable.

## 2015-03-19 NOTE — H&P (Signed)
Reviewed paper H+P, will be scanned into chart. Decreased sensation in median nerve distribution, will need to add carpla tunnel release to procedure.

## 2015-03-19 NOTE — Anesthesia Procedure Notes (Signed)
Procedure Name: LMA Insertion Date/Time: 03/19/2015 1:20 PM Performed by: Vaughan Sine Pre-anesthesia Checklist: Patient identified, Emergency Drugs available, Suction available, Patient being monitored and Timeout performed Patient Re-evaluated:Patient Re-evaluated prior to inductionOxygen Delivery Method: Circle system utilized Preoxygenation: Pre-oxygenation with 100% oxygen Intubation Type: IV induction Ventilation: Mask ventilation without difficulty LMA: LMA inserted LMA Size: 4.0 Number of attempts: 1 Placement Confirmation: breath sounds checked- equal and bilateral,  CO2 detector and positive ETCO2 Tube secured with: Tape

## 2015-03-19 NOTE — Discharge Instructions (Signed)

## 2015-03-19 NOTE — Anesthesia Preprocedure Evaluation (Signed)
Anesthesia Evaluation  Patient identified by MRN, date of birth, ID band Patient awake    Reviewed: Allergy & Precautions, NPO status , Patient's Chart, lab work & pertinent test results, reviewed documented beta blocker date and time   Airway Mallampati: III       Dental  (+) Upper Dentures, Lower Dentures   Pulmonary shortness of breath, sleep apnea, Continuous Positive Airway Pressure Ventilation and Oxygen sleep apnea , COPDformer smoker,          Cardiovascular hypertension, Pt. on medications + CAD and + Peripheral Vascular Disease     Neuro/Psych PSYCHIATRIC DISORDERS Anxiety TIA Neuromuscular disease    GI/Hepatic GERD-  ,  Endo/Other    Renal/GU      Musculoskeletal  (+) Arthritis -, Osteoarthritis,    Abdominal   Peds  Hematology   Anesthesia Other Findings Sleep apnea. Uses CPAP occasionally. Takes it off at night frequently. O2 at 2L at night only. RBBB for years. A fib during exercise, otherwise NSR. Echo few years ago OK.  Reproductive/Obstetrics                             Anesthesia Physical Anesthesia Plan  ASA: III  Anesthesia Plan: General   Post-op Pain Management:    Induction: Intravenous  Airway Management Planned: LMA  Additional Equipment:   Intra-op Plan:   Post-operative Plan:   Informed Consent: I have reviewed the patients History and Physical, chart, labs and discussed the procedure including the risks, benefits and alternatives for the proposed anesthesia with the patient or authorized representative who has indicated his/her understanding and acceptance.     Plan Discussed with: CRNA  Anesthesia Plan Comments:         Anesthesia Quick Evaluation

## 2015-03-19 NOTE — Transfer of Care (Signed)
Immediate Anesthesia Transfer of Care Note  Patient: [name]  Procedure(s) Performed: Procedure(s): OPEN REDUCTION INTERNAL FIXATION (ORIF) WRIST FRACTURE (Right)  Patient Location: PACU  Anesthesia Type:General  Level of Consciousness: awake, alert  and sedated  Airway & Oxygen Therapy: Patient Spontanous Breathing and Patient connected to face mask oxygen, stable  Post-op Assessment: Report given to RN  Post vital signs: Reviewed and stable  Last Vitals:  Filed Vitals:   03/19/15 1447  BP: 156/92  Pulse: 109  Temp: 36.3 C  Resp: 20    Complications: No apparent anesthesia complications

## 2015-03-20 DIAGNOSIS — S52611D Displaced fracture of right ulna styloid process, subsequent encounter for closed fracture with routine healing: Secondary | ICD-10-CM | POA: Diagnosis not present

## 2015-03-20 DIAGNOSIS — J849 Interstitial pulmonary disease, unspecified: Secondary | ICD-10-CM | POA: Diagnosis not present

## 2015-03-20 DIAGNOSIS — J449 Chronic obstructive pulmonary disease, unspecified: Secondary | ICD-10-CM | POA: Diagnosis not present

## 2015-03-20 DIAGNOSIS — M1990 Unspecified osteoarthritis, unspecified site: Secondary | ICD-10-CM | POA: Diagnosis not present

## 2015-03-20 DIAGNOSIS — S52521D Torus fracture of lower end of right radius, subsequent encounter for fracture with routine healing: Secondary | ICD-10-CM | POA: Diagnosis not present

## 2015-03-20 DIAGNOSIS — I2510 Atherosclerotic heart disease of native coronary artery without angina pectoris: Secondary | ICD-10-CM | POA: Diagnosis not present

## 2015-03-20 NOTE — Anesthesia Postprocedure Evaluation (Signed)
  Anesthesia Post-op Note  Patient: [name]  Procedure(s) Performed: Procedure(s): OPEN REDUCTION INTERNAL FIXATION (ORIF) WRIST FRACTURE (Right)  Anesthesia type:General  Patient location: PACU  Post pain: Pain level controlled  Post assessment: Post-op Vital signs reviewed, Patient's Cardiovascular Status Stable, Respiratory Function Stable, Patent Airway and No signs of Nausea or vomiting  Post vital signs: Reviewed and stable  Last Vitals:  Filed Vitals:   03/19/15 1600  BP: 125/86  Pulse: 99  Temp:   Resp: 16    Level of consciousness: awake, alert  and patient cooperative  Complications: No apparent anesthesia complications

## 2015-03-21 DIAGNOSIS — M1990 Unspecified osteoarthritis, unspecified site: Secondary | ICD-10-CM | POA: Diagnosis not present

## 2015-03-21 DIAGNOSIS — J449 Chronic obstructive pulmonary disease, unspecified: Secondary | ICD-10-CM | POA: Diagnosis not present

## 2015-03-21 DIAGNOSIS — J849 Interstitial pulmonary disease, unspecified: Secondary | ICD-10-CM | POA: Diagnosis not present

## 2015-03-21 DIAGNOSIS — I2510 Atherosclerotic heart disease of native coronary artery without angina pectoris: Secondary | ICD-10-CM | POA: Diagnosis not present

## 2015-03-21 DIAGNOSIS — S52611D Displaced fracture of right ulna styloid process, subsequent encounter for closed fracture with routine healing: Secondary | ICD-10-CM | POA: Diagnosis not present

## 2015-03-21 DIAGNOSIS — S52521D Torus fracture of lower end of right radius, subsequent encounter for fracture with routine healing: Secondary | ICD-10-CM | POA: Diagnosis not present

## 2015-03-22 DIAGNOSIS — M1990 Unspecified osteoarthritis, unspecified site: Secondary | ICD-10-CM | POA: Diagnosis not present

## 2015-03-22 DIAGNOSIS — S52521D Torus fracture of lower end of right radius, subsequent encounter for fracture with routine healing: Secondary | ICD-10-CM | POA: Diagnosis not present

## 2015-03-22 DIAGNOSIS — S52611D Displaced fracture of right ulna styloid process, subsequent encounter for closed fracture with routine healing: Secondary | ICD-10-CM | POA: Diagnosis not present

## 2015-03-22 DIAGNOSIS — I2510 Atherosclerotic heart disease of native coronary artery without angina pectoris: Secondary | ICD-10-CM | POA: Diagnosis not present

## 2015-03-22 DIAGNOSIS — J449 Chronic obstructive pulmonary disease, unspecified: Secondary | ICD-10-CM | POA: Diagnosis not present

## 2015-03-22 DIAGNOSIS — J849 Interstitial pulmonary disease, unspecified: Secondary | ICD-10-CM | POA: Diagnosis not present

## 2015-03-23 ENCOUNTER — Telehealth: Payer: Self-pay

## 2015-03-23 DIAGNOSIS — S52521D Torus fracture of lower end of right radius, subsequent encounter for fracture with routine healing: Secondary | ICD-10-CM | POA: Diagnosis not present

## 2015-03-23 DIAGNOSIS — I2510 Atherosclerotic heart disease of native coronary artery without angina pectoris: Secondary | ICD-10-CM | POA: Diagnosis not present

## 2015-03-23 DIAGNOSIS — J449 Chronic obstructive pulmonary disease, unspecified: Secondary | ICD-10-CM | POA: Diagnosis not present

## 2015-03-23 DIAGNOSIS — J849 Interstitial pulmonary disease, unspecified: Secondary | ICD-10-CM | POA: Diagnosis not present

## 2015-03-23 DIAGNOSIS — S52611D Displaced fracture of right ulna styloid process, subsequent encounter for closed fracture with routine healing: Secondary | ICD-10-CM | POA: Diagnosis not present

## 2015-03-23 DIAGNOSIS — M1990 Unspecified osteoarthritis, unspecified site: Secondary | ICD-10-CM | POA: Diagnosis not present

## 2015-03-23 NOTE — Telephone Encounter (Signed)
Pt "significant other" called, would like lab results.

## 2015-03-23 NOTE — Telephone Encounter (Signed)
Reviewed preliminary cholesterol results with pt. Informed him that I would call back with all results after Dr. Ellyn Hack reviews. Pt verbalized understanding with no further questions.

## 2015-03-23 NOTE — Telephone Encounter (Signed)
Please call cell phone with results for cholesterol.

## 2015-03-26 ENCOUNTER — Telehealth: Payer: Self-pay | Admitting: *Deleted

## 2015-03-26 DIAGNOSIS — J449 Chronic obstructive pulmonary disease, unspecified: Secondary | ICD-10-CM | POA: Diagnosis not present

## 2015-03-26 DIAGNOSIS — S52521D Torus fracture of lower end of right radius, subsequent encounter for fracture with routine healing: Secondary | ICD-10-CM | POA: Diagnosis not present

## 2015-03-26 DIAGNOSIS — I2510 Atherosclerotic heart disease of native coronary artery without angina pectoris: Secondary | ICD-10-CM | POA: Diagnosis not present

## 2015-03-26 DIAGNOSIS — S52611D Displaced fracture of right ulna styloid process, subsequent encounter for closed fracture with routine healing: Secondary | ICD-10-CM | POA: Diagnosis not present

## 2015-03-26 DIAGNOSIS — M1990 Unspecified osteoarthritis, unspecified site: Secondary | ICD-10-CM | POA: Diagnosis not present

## 2015-03-26 DIAGNOSIS — J849 Interstitial pulmonary disease, unspecified: Secondary | ICD-10-CM | POA: Diagnosis not present

## 2015-03-26 NOTE — Telephone Encounter (Signed)
[name] and [name]. Result given . Verbalized understanding

## 2015-03-26 NOTE — Telephone Encounter (Signed)
-----   Message from Leonie Man, MD sent at 03/26/2015  4:03 PM EDT ----- Excellent total cholesterol level. HDL is a little low, would like to be greater than 40. LDL is great at 48. With the exception of mildly elevated bilirubin (improved from 6 months ago) LFTs are normal as well as his renal function.  Continue current regimen.

## 2015-03-27 DIAGNOSIS — M1990 Unspecified osteoarthritis, unspecified site: Secondary | ICD-10-CM | POA: Diagnosis not present

## 2015-03-27 DIAGNOSIS — S52521D Torus fracture of lower end of right radius, subsequent encounter for fracture with routine healing: Secondary | ICD-10-CM | POA: Diagnosis not present

## 2015-03-27 DIAGNOSIS — J849 Interstitial pulmonary disease, unspecified: Secondary | ICD-10-CM | POA: Diagnosis not present

## 2015-03-27 DIAGNOSIS — I2510 Atherosclerotic heart disease of native coronary artery without angina pectoris: Secondary | ICD-10-CM | POA: Diagnosis not present

## 2015-03-27 DIAGNOSIS — J449 Chronic obstructive pulmonary disease, unspecified: Secondary | ICD-10-CM | POA: Diagnosis not present

## 2015-03-27 DIAGNOSIS — S52611D Displaced fracture of right ulna styloid process, subsequent encounter for closed fracture with routine healing: Secondary | ICD-10-CM | POA: Diagnosis not present

## 2015-03-28 DIAGNOSIS — M1990 Unspecified osteoarthritis, unspecified site: Secondary | ICD-10-CM | POA: Diagnosis not present

## 2015-03-28 DIAGNOSIS — S52611D Displaced fracture of right ulna styloid process, subsequent encounter for closed fracture with routine healing: Secondary | ICD-10-CM | POA: Diagnosis not present

## 2015-03-28 DIAGNOSIS — I2510 Atherosclerotic heart disease of native coronary artery without angina pectoris: Secondary | ICD-10-CM | POA: Diagnosis not present

## 2015-03-28 DIAGNOSIS — S52521D Torus fracture of lower end of right radius, subsequent encounter for fracture with routine healing: Secondary | ICD-10-CM | POA: Diagnosis not present

## 2015-03-28 DIAGNOSIS — J449 Chronic obstructive pulmonary disease, unspecified: Secondary | ICD-10-CM | POA: Diagnosis not present

## 2015-03-28 DIAGNOSIS — J849 Interstitial pulmonary disease, unspecified: Secondary | ICD-10-CM | POA: Diagnosis not present

## 2015-03-30 DIAGNOSIS — M1990 Unspecified osteoarthritis, unspecified site: Secondary | ICD-10-CM | POA: Diagnosis not present

## 2015-03-30 DIAGNOSIS — J849 Interstitial pulmonary disease, unspecified: Secondary | ICD-10-CM | POA: Diagnosis not present

## 2015-03-30 DIAGNOSIS — S52611D Displaced fracture of right ulna styloid process, subsequent encounter for closed fracture with routine healing: Secondary | ICD-10-CM | POA: Diagnosis not present

## 2015-03-30 DIAGNOSIS — S52521D Torus fracture of lower end of right radius, subsequent encounter for fracture with routine healing: Secondary | ICD-10-CM | POA: Diagnosis not present

## 2015-03-30 DIAGNOSIS — I2510 Atherosclerotic heart disease of native coronary artery without angina pectoris: Secondary | ICD-10-CM | POA: Diagnosis not present

## 2015-03-30 DIAGNOSIS — J449 Chronic obstructive pulmonary disease, unspecified: Secondary | ICD-10-CM | POA: Diagnosis not present

## 2015-04-02 DIAGNOSIS — S52611D Displaced fracture of right ulna styloid process, subsequent encounter for closed fracture with routine healing: Secondary | ICD-10-CM | POA: Diagnosis not present

## 2015-04-02 DIAGNOSIS — J449 Chronic obstructive pulmonary disease, unspecified: Secondary | ICD-10-CM | POA: Diagnosis not present

## 2015-04-02 DIAGNOSIS — J849 Interstitial pulmonary disease, unspecified: Secondary | ICD-10-CM | POA: Diagnosis not present

## 2015-04-02 DIAGNOSIS — I2510 Atherosclerotic heart disease of native coronary artery without angina pectoris: Secondary | ICD-10-CM | POA: Diagnosis not present

## 2015-04-02 DIAGNOSIS — M1990 Unspecified osteoarthritis, unspecified site: Secondary | ICD-10-CM | POA: Diagnosis not present

## 2015-04-02 DIAGNOSIS — S52521D Torus fracture of lower end of right radius, subsequent encounter for fracture with routine healing: Secondary | ICD-10-CM | POA: Diagnosis not present

## 2015-04-03 DIAGNOSIS — S52521D Torus fracture of lower end of right radius, subsequent encounter for fracture with routine healing: Secondary | ICD-10-CM | POA: Diagnosis not present

## 2015-04-03 DIAGNOSIS — M1990 Unspecified osteoarthritis, unspecified site: Secondary | ICD-10-CM | POA: Diagnosis not present

## 2015-04-03 DIAGNOSIS — S52611D Displaced fracture of right ulna styloid process, subsequent encounter for closed fracture with routine healing: Secondary | ICD-10-CM | POA: Diagnosis not present

## 2015-04-03 DIAGNOSIS — I2510 Atherosclerotic heart disease of native coronary artery without angina pectoris: Secondary | ICD-10-CM | POA: Diagnosis not present

## 2015-04-03 DIAGNOSIS — J849 Interstitial pulmonary disease, unspecified: Secondary | ICD-10-CM | POA: Diagnosis not present

## 2015-04-03 DIAGNOSIS — J449 Chronic obstructive pulmonary disease, unspecified: Secondary | ICD-10-CM | POA: Diagnosis not present

## 2015-04-04 DIAGNOSIS — S52611D Displaced fracture of right ulna styloid process, subsequent encounter for closed fracture with routine healing: Secondary | ICD-10-CM | POA: Diagnosis not present

## 2015-04-04 DIAGNOSIS — M1990 Unspecified osteoarthritis, unspecified site: Secondary | ICD-10-CM | POA: Diagnosis not present

## 2015-04-04 DIAGNOSIS — S52521D Torus fracture of lower end of right radius, subsequent encounter for fracture with routine healing: Secondary | ICD-10-CM | POA: Diagnosis not present

## 2015-04-04 DIAGNOSIS — J449 Chronic obstructive pulmonary disease, unspecified: Secondary | ICD-10-CM | POA: Diagnosis not present

## 2015-04-04 DIAGNOSIS — I2510 Atherosclerotic heart disease of native coronary artery without angina pectoris: Secondary | ICD-10-CM | POA: Diagnosis not present

## 2015-04-04 DIAGNOSIS — J849 Interstitial pulmonary disease, unspecified: Secondary | ICD-10-CM | POA: Diagnosis not present

## 2015-04-05 DIAGNOSIS — S52611D Displaced fracture of right ulna styloid process, subsequent encounter for closed fracture with routine healing: Secondary | ICD-10-CM | POA: Diagnosis not present

## 2015-04-05 DIAGNOSIS — I2510 Atherosclerotic heart disease of native coronary artery without angina pectoris: Secondary | ICD-10-CM | POA: Diagnosis not present

## 2015-04-05 DIAGNOSIS — J849 Interstitial pulmonary disease, unspecified: Secondary | ICD-10-CM | POA: Diagnosis not present

## 2015-04-05 DIAGNOSIS — S52521D Torus fracture of lower end of right radius, subsequent encounter for fracture with routine healing: Secondary | ICD-10-CM | POA: Diagnosis not present

## 2015-04-05 DIAGNOSIS — M1990 Unspecified osteoarthritis, unspecified site: Secondary | ICD-10-CM | POA: Diagnosis not present

## 2015-04-05 DIAGNOSIS — J449 Chronic obstructive pulmonary disease, unspecified: Secondary | ICD-10-CM | POA: Diagnosis not present

## 2015-04-09 DIAGNOSIS — M1990 Unspecified osteoarthritis, unspecified site: Secondary | ICD-10-CM | POA: Diagnosis not present

## 2015-04-09 DIAGNOSIS — J449 Chronic obstructive pulmonary disease, unspecified: Secondary | ICD-10-CM | POA: Diagnosis not present

## 2015-04-09 DIAGNOSIS — J849 Interstitial pulmonary disease, unspecified: Secondary | ICD-10-CM | POA: Diagnosis not present

## 2015-04-09 DIAGNOSIS — S52611D Displaced fracture of right ulna styloid process, subsequent encounter for closed fracture with routine healing: Secondary | ICD-10-CM | POA: Diagnosis not present

## 2015-04-09 DIAGNOSIS — S52521D Torus fracture of lower end of right radius, subsequent encounter for fracture with routine healing: Secondary | ICD-10-CM | POA: Diagnosis not present

## 2015-04-09 DIAGNOSIS — I2510 Atherosclerotic heart disease of native coronary artery without angina pectoris: Secondary | ICD-10-CM | POA: Diagnosis not present

## 2015-04-10 DIAGNOSIS — M1990 Unspecified osteoarthritis, unspecified site: Secondary | ICD-10-CM | POA: Diagnosis not present

## 2015-04-10 DIAGNOSIS — J449 Chronic obstructive pulmonary disease, unspecified: Secondary | ICD-10-CM | POA: Diagnosis not present

## 2015-04-10 DIAGNOSIS — I2510 Atherosclerotic heart disease of native coronary artery without angina pectoris: Secondary | ICD-10-CM | POA: Diagnosis not present

## 2015-04-10 DIAGNOSIS — J849 Interstitial pulmonary disease, unspecified: Secondary | ICD-10-CM | POA: Diagnosis not present

## 2015-04-10 DIAGNOSIS — S52521D Torus fracture of lower end of right radius, subsequent encounter for fracture with routine healing: Secondary | ICD-10-CM | POA: Diagnosis not present

## 2015-04-10 DIAGNOSIS — S52611D Displaced fracture of right ulna styloid process, subsequent encounter for closed fracture with routine healing: Secondary | ICD-10-CM | POA: Diagnosis not present

## 2015-04-11 DIAGNOSIS — Z8781 Personal history of (healed) traumatic fracture: Secondary | ICD-10-CM | POA: Diagnosis not present

## 2015-04-11 DIAGNOSIS — J449 Chronic obstructive pulmonary disease, unspecified: Secondary | ICD-10-CM | POA: Diagnosis not present

## 2015-04-11 DIAGNOSIS — S52611D Displaced fracture of right ulna styloid process, subsequent encounter for closed fracture with routine healing: Secondary | ICD-10-CM | POA: Diagnosis not present

## 2015-04-11 DIAGNOSIS — I2510 Atherosclerotic heart disease of native coronary artery without angina pectoris: Secondary | ICD-10-CM | POA: Diagnosis not present

## 2015-04-11 DIAGNOSIS — J849 Interstitial pulmonary disease, unspecified: Secondary | ICD-10-CM | POA: Diagnosis not present

## 2015-04-11 DIAGNOSIS — S52521D Torus fracture of lower end of right radius, subsequent encounter for fracture with routine healing: Secondary | ICD-10-CM | POA: Diagnosis not present

## 2015-04-11 DIAGNOSIS — M1990 Unspecified osteoarthritis, unspecified site: Secondary | ICD-10-CM | POA: Diagnosis not present

## 2015-04-11 DIAGNOSIS — Z967 Presence of other bone and tendon implants: Secondary | ICD-10-CM | POA: Diagnosis not present

## 2015-04-12 ENCOUNTER — Telehealth: Payer: Self-pay | Admitting: Internal Medicine

## 2015-04-12 DIAGNOSIS — S52611D Displaced fracture of right ulna styloid process, subsequent encounter for closed fracture with routine healing: Secondary | ICD-10-CM | POA: Diagnosis not present

## 2015-04-12 DIAGNOSIS — I2510 Atherosclerotic heart disease of native coronary artery without angina pectoris: Secondary | ICD-10-CM | POA: Diagnosis not present

## 2015-04-12 DIAGNOSIS — J449 Chronic obstructive pulmonary disease, unspecified: Secondary | ICD-10-CM | POA: Diagnosis not present

## 2015-04-12 DIAGNOSIS — J849 Interstitial pulmonary disease, unspecified: Secondary | ICD-10-CM | POA: Diagnosis not present

## 2015-04-12 DIAGNOSIS — S52521D Torus fracture of lower end of right radius, subsequent encounter for fracture with routine healing: Secondary | ICD-10-CM | POA: Diagnosis not present

## 2015-04-12 DIAGNOSIS — M1990 Unspecified osteoarthritis, unspecified site: Secondary | ICD-10-CM | POA: Diagnosis not present

## 2015-04-12 NOTE — Telephone Encounter (Signed)
Nothing further needed at this time. 

## 2015-04-16 DIAGNOSIS — S52611D Displaced fracture of right ulna styloid process, subsequent encounter for closed fracture with routine healing: Secondary | ICD-10-CM | POA: Diagnosis not present

## 2015-04-16 DIAGNOSIS — M1990 Unspecified osteoarthritis, unspecified site: Secondary | ICD-10-CM | POA: Diagnosis not present

## 2015-04-16 DIAGNOSIS — I2510 Atherosclerotic heart disease of native coronary artery without angina pectoris: Secondary | ICD-10-CM | POA: Diagnosis not present

## 2015-04-16 DIAGNOSIS — J849 Interstitial pulmonary disease, unspecified: Secondary | ICD-10-CM | POA: Diagnosis not present

## 2015-04-16 DIAGNOSIS — J449 Chronic obstructive pulmonary disease, unspecified: Secondary | ICD-10-CM | POA: Diagnosis not present

## 2015-04-16 DIAGNOSIS — S52521D Torus fracture of lower end of right radius, subsequent encounter for fracture with routine healing: Secondary | ICD-10-CM | POA: Diagnosis not present

## 2015-04-18 ENCOUNTER — Other Ambulatory Visit: Payer: Self-pay

## 2015-04-18 DIAGNOSIS — M1990 Unspecified osteoarthritis, unspecified site: Secondary | ICD-10-CM | POA: Diagnosis not present

## 2015-04-18 DIAGNOSIS — S52611D Displaced fracture of right ulna styloid process, subsequent encounter for closed fracture with routine healing: Secondary | ICD-10-CM | POA: Diagnosis not present

## 2015-04-18 DIAGNOSIS — S52521D Torus fracture of lower end of right radius, subsequent encounter for fracture with routine healing: Secondary | ICD-10-CM | POA: Diagnosis not present

## 2015-04-18 DIAGNOSIS — J449 Chronic obstructive pulmonary disease, unspecified: Secondary | ICD-10-CM | POA: Diagnosis not present

## 2015-04-18 DIAGNOSIS — J849 Interstitial pulmonary disease, unspecified: Secondary | ICD-10-CM | POA: Diagnosis not present

## 2015-04-18 DIAGNOSIS — I2510 Atherosclerotic heart disease of native coronary artery without angina pectoris: Secondary | ICD-10-CM | POA: Diagnosis not present

## 2015-04-18 DIAGNOSIS — E785 Hyperlipidemia, unspecified: Secondary | ICD-10-CM

## 2015-04-18 MED ORDER — EZETIMIBE 10 MG PO TABS: 10.0000 mg | ORAL_TABLET | Freq: Every day | ORAL | Status: DC

## 2015-04-18 NOTE — Telephone Encounter (Signed)
This is a pt of Dr. Sharyon Medicus, will you please refill this prescription. Next ov appointment is on 04/24/2015.  Thanks,

## 2015-04-23 DIAGNOSIS — S52521D Torus fracture of lower end of right radius, subsequent encounter for fracture with routine healing: Secondary | ICD-10-CM | POA: Diagnosis not present

## 2015-04-23 DIAGNOSIS — J849 Interstitial pulmonary disease, unspecified: Secondary | ICD-10-CM | POA: Diagnosis not present

## 2015-04-23 DIAGNOSIS — J449 Chronic obstructive pulmonary disease, unspecified: Secondary | ICD-10-CM | POA: Diagnosis not present

## 2015-04-23 DIAGNOSIS — M1990 Unspecified osteoarthritis, unspecified site: Secondary | ICD-10-CM | POA: Diagnosis not present

## 2015-04-23 DIAGNOSIS — S52611D Displaced fracture of right ulna styloid process, subsequent encounter for closed fracture with routine healing: Secondary | ICD-10-CM | POA: Diagnosis not present

## 2015-04-23 DIAGNOSIS — I2510 Atherosclerotic heart disease of native coronary artery without angina pectoris: Secondary | ICD-10-CM | POA: Diagnosis not present

## 2015-04-24 ENCOUNTER — Encounter: Payer: Self-pay | Admitting: Family Medicine

## 2015-04-24 ENCOUNTER — Ambulatory Visit (INDEPENDENT_AMBULATORY_CARE_PROVIDER_SITE_OTHER): Payer: Medicare Other | Admitting: Family Medicine

## 2015-04-24 VITALS — BP 128/76 | HR 76 | Temp 98.3°F | Resp 16 | Ht 72.0 in | Wt 219.6 lb

## 2015-04-24 DIAGNOSIS — D692 Other nonthrombocytopenic purpura: Secondary | ICD-10-CM | POA: Diagnosis not present

## 2015-04-24 DIAGNOSIS — Z0000 Encounter for general adult medical examination without abnormal findings: Secondary | ICD-10-CM | POA: Diagnosis not present

## 2015-04-24 DIAGNOSIS — G4700 Insomnia, unspecified: Secondary | ICD-10-CM | POA: Diagnosis not present

## 2015-04-24 DIAGNOSIS — E785 Hyperlipidemia, unspecified: Secondary | ICD-10-CM

## 2015-04-24 MED ORDER — ALPRAZOLAM 0.5 MG PO TABS: 0.5000 mg | ORAL_TABLET | Freq: Every evening | ORAL | Status: DC | PRN

## 2015-04-24 MED ORDER — EZETIMIBE 10 MG PO TABS: 10.0000 mg | ORAL_TABLET | Freq: Every day | ORAL | Status: DC

## 2015-04-24 MED ORDER — ROSUVASTATIN CALCIUM 20 MG PO TABS: 20.0000 mg | ORAL_TABLET | Freq: Every day | ORAL | Status: DC

## 2015-04-24 NOTE — Progress Notes (Signed)
Patient ID: [name], male   DOB: 1933/11/12, 81 y.o.   MRN: [id number]        Patient: [name], Male    DOB: Feb 25, 1934, 81 y.o.   MRN: [id number] Visit Date: 04/24/2015  Today's Provider: Margarita Rana, MD   Chief Complaint  Patient presents with  . Medicare Wellness   Subjective:    Annual wellness visit [name] is a 81 y.o. male. He feels well. He reports exercising daily (walking). He reports he is sleeping poorly, 4-5 hours a night. 11/07/13 CPE 10/28/10 Colonoscopy-polyps, internal hemorrhoids, recheck in 5 yrs 11/07/13 EKG   Lab Results  Component Value Date   WBC 11.1* 03/15/2015   HGB 14.2 03/15/2015   HCT 42.8 03/15/2015   PLT 135* 03/15/2015   GLUCOSE 105* 03/15/2015   CHOL 106 03/15/2015   TRIG 136 03/15/2015   HDL 31* 03/15/2015   LDLCALC 48 03/15/2015   ALT 15 03/15/2015   AST 18 03/15/2015   NA 142 03/15/2015   K 4.1 03/15/2015   CL 103 03/15/2015   CREATININE 1.06 03/15/2015   BUN 16 03/15/2015   CO2 18 03/15/2015   INR 1.1 09/27/2014    -----------------------------------------------------------   Review of Systems  Constitutional: Negative.   HENT: Positive for drooling and hearing loss.   Eyes: Negative.   Respiratory: Positive for apnea.   Gastrointestinal: Negative.   Endocrine: Positive for polyuria.  Genitourinary: Negative.   Musculoskeletal: Negative.   Skin:       Bruises easily.   Allergic/Immunologic: Negative.   Neurological: Negative.   Hematological: Bruises/bleeds easily.  Psychiatric/Behavioral: Negative.     Social History   Social History  . Marital Status: Widowed    Spouse Name: N/A  . Number of Children: N/A  . Years of Education: N/A   Occupational History  . Not on file.   Social History Main Topics  . Smoking status: Former Smoker    Types: Cigarettes    Quit date: 08/04/1997  . Smokeless tobacco: Never Used  . Alcohol Use: No  . Drug Use: No  . Sexual Activity: Not  on file   Other Topics Concern  . Not on file   Social History Narrative   He is a widowed father of one, grandfather of 53 with 3 stepchildren. He has been working out vigorously at [employer] the basis. He's up to doing 20 minutes on the stationary bike and 10-15 minutes on the treadmill. He doesn't smoke or drink.    Patient Active Problem List   Diagnosis Date Noted  . Abnormal blood chemistry 02/10/2015  . Abnormal lung field 02/10/2015  . Disorder of oral mucous membrane 02/10/2015  . CN (constipation) 02/10/2015  . DD (diverticular disease) 02/10/2015  . Accumulation of fluid in tissues 02/10/2015  . Elevated WBC count 02/10/2015  . Fatigue 02/10/2015  . Hemorrhoid 02/10/2015  . Hypoxia 02/10/2015  . Cannot sleep 02/10/2015  . Lichen planus 84/13/2440  . Restless leg 02/10/2015  . Breath shortness 02/10/2015  . 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 Obstructive apnea 02/10/2015  . Temporary cerebral vascular dysfunction 02/10/2015  . B12 deficiency 02/10/2015  . SOB (shortness of breath) 12/14/2014  . Lower extremity edema 12/14/2014  . COPD (chronic obstructive pulmonary disease) 11/14/2014  . ILD (interstitial lung disease) 11/14/2014  . Confusion state 11/08/2014  . Amnesia 11/08/2014  . PAF (paroxysmal atrial fibrillation)   . Hypotension 12/09/2013  . Insomnia 12/09/2013  . OSA on CPAP   .  Erectile dysfunction   . Dyspnea on exertion - -essentially resolved with BB dose reduction & wgt loss 03/29/2013  . Overweight (BMI 25.0-29.9) -- Wgt back up 01/17/2013  . Atherosclerotic heart disease of native coronary artery without angina pectoris - s/p CABG, occluded SVG-D1; patent LIMA-LAD, SVG-OM, SVG- RCA   . Essential hypertension   . Hyperlipidemia with target LDL less than 70   . Aorto-iliac disease   . Basal cell carcinoma of eyelid 01/03/2013  . Carotid artery stenosis 09/09/2012  . ED (erectile dysfunction) of organic origin 11/19/2009  . Allergic rhinitis  08/17/2009  . Awareness of heartbeats 08/17/2009  . Arthralgia of ankle or foot 06/04/2009  . Adaptation reaction 05/28/2009  . Arterial vascular disease 05/28/2009  . Benign prostatic hypertrophy without urinary obstruction 05/28/2009  . Acid reflux 05/28/2009  . Arthritis, degenerative 05/28/2009  . Need for prophylactic hormone replacement therapy (postmenopausal) 05/28/2009  . Current tobacco use 05/28/2009  . PAD (peripheral artery disease) - bilateral common iliac stents 12/15/2005    Past Surgical History  Procedure Laterality Date  . Coronary artery bypass graft  1998    LIMA-LAD, SVG-OM, SVG-RPDA, SVG-DIAG  . Cardiac catheterization  01/30/1998      total native LAD andRCA ,mod native CIRC; svg to RCA and OBTUSE MARG. PATENT the left internal mammary artery graft to LAD ; LV NORMAL  /  . Doppler echocardiography  11/20/2010    EF =>55%; LV norm mild aortic scelorosis  . Nm myocar perf wall motion  11/11/2010; February 2016    a) TM STRESS----NORMAL PERFUSION ,EF 68%; b) EF 50-55%. Normal LV function. No significant ischemia or infarction.  [name] Cpet / met  12/2012    Mild chronotropic incompetence - read 82% of predicted; also reduced effort; peak VO2 15.7 / 75% (did not reach Max effort) -- suggested ischemic response in last 1.5 minutes of exercise. Normal pulmonary function on PFTs but poor response to  . Arch aortogram and carotid aortogram  06/02/2006    Dr Oneida Alar did surgery  . Iliac artery stent  12/15/2005    PTA and direct stenting rgt and lft common iliac arteries  . Thoracic aorta - carotid angiogram  October 2007    Dr. 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 Carotid endarterectomy Left Oct. 29, 2007    Dr. Oneida Alar:  . Ankle surgery    . Hiatal hernia repair    . Transthoracic echocardiogram  February 2016    [facility]: Normal LV function. Dilated left atrium.  . Orif wrist  fracture Right 03/19/2015    Procedure: OPEN REDUCTION INTERNAL FIXATION (ORIF) WRIST FRACTURE;  Surgeon: Hessie Knows, MD;  Location: [facility] ORS;  Service: Orthopedics;  Laterality: Right;  . Fracture surgery Right 03/19/2015    wrist    His family history includes Alcohol abuse in his father; Dementia in his mother; Ulcers in his mother.    Previous Medications   ALBUTEROL (PROVENTIL HFA;VENTOLIN HFA) 108 (90 BASE) MCG/ACT INHALER    Inhale 2 puffs into the lungs every 4 (four) hours as needed for wheezing or shortness of breath.   APIXABAN (ELIQUIS) 5 MG TABS TABLET    Take 1 tablet (5 mg total) by mouth 2 (two) times daily.   ASCORBIC ACID (VITAMIN C) 1000 MG TABLET    Take 1,000 mg by mouth daily.   BIOTIN 1000 MCG TABLET    Take 1,000 mcg by mouth daily.  DEXILANT 60 MG CAPSULE    TAKE ONE CAPSULE BY MOUTH EVERY DAY   FINASTERIDE (PROSCAR) 5 MG TABLET    Take 5 mg by mouth daily.   FLUTICASONE-SALMETEROL (ADVAIR DISKUS) 250-50 MCG/DOSE AEPB    Inhale 1 Inhaler into the lungs daily.   FLUTICASONE-SALMETEROL (ADVAIR) 250-50 MCG/DOSE AEPB    Inhale 1 puff into the lungs 2 (two) times daily.   FUROSEMIDE (LASIX) 20 MG TABLET    Take 1 tablet (20 mg total) by mouth as needed (as needed for swelling).   LISINOPRIL (PRINIVIL,ZESTRIL) 2.5 MG TABLET    Take 1 tablet (2.5 mg total) by mouth 2 (two) times daily.   MAGNESIUM OXIDE (MAG-OX) 400 MG TABLET    Take 400 mg by mouth daily.   METOPROLOL TARTRATE (LOPRESSOR) 25 MG TABLET    Take 25 mg by mouth 2 (two) times daily.    NITROGLYCERIN (NITROSTAT) 0.4 MG SL TABLET    Place 1 tablet (0.4 mg total) under the tongue every 5 (five) minutes as needed for chest pain.   NORTRIPTYLINE (PAMELOR) 10 MG CAPSULE    As needed for insomnia   OMEGA-3 FATTY ACIDS (FISH OIL PO)    Take 700 Units by mouth daily.   TAMSULOSIN (FLOMAX) 0.4 MG CAPS CAPSULE    Take 0.4 mg by mouth daily.    Patient Care Team: Margarita Rana, MD as PCP - General (Family  Medicine) Margarita Rana, MD as Referring Physician (Family Medicine) Leonie Man, MD as Consulting Physician (Cardiology) Kennieth Francois, MD (Dermatology)     Objective:   Vitals: BP 128/76 mmHg  Pulse 76  Temp(Src) 98.3 F (36.8 C) (Oral)  Resp 16  Ht 6' (1.829 m)  Wt 219 lb 9.6 oz (99.61 kg)  BMI 29.78 kg/m2  SpO2 96%  Physical Exam  Constitutional: He is oriented to person, place, and time. He appears well-developed and well-nourished.  HENT:  Head: Normocephalic and atraumatic.  Right Ear: External ear normal.  Left Ear: External ear normal.  Nose: Nose normal.  Mouth/Throat: Oropharynx is clear and moist.  Eyes: Conjunctivae and EOM are normal. Pupils are equal, round, and reactive to light.  Neck: Normal range of motion. Neck supple.  Cardiovascular: Normal rate, regular rhythm and normal heart sounds.   Pulmonary/Chest: Effort normal and breath sounds normal.  Abdominal: Soft. Bowel sounds are normal.  Musculoskeletal: Normal range of motion.  Neurological: He is alert and oriented to person, place, and time. He has normal reflexes.  Skin: Skin is warm and dry.  Bruising noted.   Psychiatric: He has a normal mood and affect. His behavior is normal. Judgment and thought content normal.    Activities of Daily Living In your present state of health, do you have any difficulty performing the following activities: 04/24/2015 04/24/2015  Hearing? - N  Vision? - N  Difficulty concentrating or making decisions? - Y  Walking or climbing stairs? - Y  Dressing or bathing? N N  Doing errands, shopping? N N    Fall Risk Assessment Fall Risk  04/24/2015  Falls in the past year? Yes  Number falls in past yr: 1  Injury with Fall? Yes     Depression Screen PHQ 2/9 Scores 04/24/2015  PHQ - 2 Score 0    Cognitive Testing - 6-CIT  Correct? Score   What year is it? yes 0 0 or 4  What month is it? yes 0 0 or 3  Memorize:    Jenny Reichmann,  [place]ala [address],  32,  [place],       What time is it? (within 1 hour) yes 0 0 or 3  Count backwards from 20 yes 0 0, 2, or 4  Name the months of the year yes 0 0, 2, or 4  Repeat name & address above yes 0 0, 2, 4, 6, 8, or 10       TOTAL SCORE  0/28   Interpretation:  Normal  Normal (0-7) Abnormal (8-28)       Assessment & Plan:     Annual Wellness Visit  Reviewed patient's Family Medical History Reviewed and updated list of patient's medical providers Assessment of cognitive impairment was done Assessed patient's functional ability Established a written schedule for health screening [place] Completed and Reviewed  Exercise Activities and Dietary recommendations Goals    . Exercise 150 minutes per week (moderate activity)       Immunization History  Administered Date(s) Administered  . Influenza-Unspecified 07/02/2013, 06/15/2014  . Pneumococcal Conjugate-13 11/07/2013  . Pneumococcal-Unspecified 06/15/2010  . Td 03/18/2011    Health Maintenance  Topic Date Due  . ZOSTAVAX  10/13/1995  . INFLUENZA VACCINE  04/02/2015  . COLONOSCOPY  10/28/2020  . TETANUS/TDAP  03/17/2021  . PNA vac Low Risk Adult  Completed       1. Medicare annual wellness visit, subsequent Stable. Patient advised to continue eating healthy and exercise daily.   2. Hyperlipidemia with target LDL less than 70 Stable. Patient advised to continue current medications and plan of care. - ezetimibe (ZETIA) 10 MG tablet; Take 1 tablet (10 mg total) by mouth daily.  Dispense: 90 tablet; Refill: 3 - rosuvastatin (CRESTOR) 20 MG tablet; Take 1 tablet (20 mg total) by mouth daily.  Dispense: 90 tablet; Refill: 2  3. Insomnia Worsening. Patient started on Alprazolam 0.63m as below. - ALPRAZolam (XANAX) 0.5 MG tablet; Take 1 tablet (0.5 mg total) by mouth at bedtime as needed for anxiety.  Dispense: 30 tablet; Refill: 0  4. Senile purpura Stable.    Patient seen and examined by Dr. NJerrell Belfast and  note scribed by SPhilbert Riser Dimas, CMA.  I have reviewed the document for accuracy and completeness and I agree with above. -Jerrell Belfast MD      ------------------------------------------------------------------------------------------------------------

## 2015-04-25 DIAGNOSIS — M1990 Unspecified osteoarthritis, unspecified site: Secondary | ICD-10-CM | POA: Diagnosis not present

## 2015-04-25 DIAGNOSIS — J449 Chronic obstructive pulmonary disease, unspecified: Secondary | ICD-10-CM | POA: Diagnosis not present

## 2015-04-25 DIAGNOSIS — S52611D Displaced fracture of right ulna styloid process, subsequent encounter for closed fracture with routine healing: Secondary | ICD-10-CM | POA: Diagnosis not present

## 2015-04-25 DIAGNOSIS — I2510 Atherosclerotic heart disease of native coronary artery without angina pectoris: Secondary | ICD-10-CM | POA: Diagnosis not present

## 2015-04-25 DIAGNOSIS — J849 Interstitial pulmonary disease, unspecified: Secondary | ICD-10-CM | POA: Diagnosis not present

## 2015-04-25 DIAGNOSIS — S52521D Torus fracture of lower end of right radius, subsequent encounter for fracture with routine healing: Secondary | ICD-10-CM | POA: Diagnosis not present

## 2015-04-27 DIAGNOSIS — Z967 Presence of other bone and tendon implants: Secondary | ICD-10-CM | POA: Diagnosis not present

## 2015-04-27 DIAGNOSIS — Z8781 Personal history of (healed) traumatic fracture: Secondary | ICD-10-CM | POA: Diagnosis not present

## 2015-04-30 DIAGNOSIS — S52521D Torus fracture of lower end of right radius, subsequent encounter for fracture with routine healing: Secondary | ICD-10-CM | POA: Diagnosis not present

## 2015-04-30 DIAGNOSIS — I2510 Atherosclerotic heart disease of native coronary artery without angina pectoris: Secondary | ICD-10-CM | POA: Diagnosis not present

## 2015-04-30 DIAGNOSIS — J449 Chronic obstructive pulmonary disease, unspecified: Secondary | ICD-10-CM | POA: Diagnosis not present

## 2015-04-30 DIAGNOSIS — J849 Interstitial pulmonary disease, unspecified: Secondary | ICD-10-CM | POA: Diagnosis not present

## 2015-04-30 DIAGNOSIS — S52611D Displaced fracture of right ulna styloid process, subsequent encounter for closed fracture with routine healing: Secondary | ICD-10-CM | POA: Diagnosis not present

## 2015-04-30 DIAGNOSIS — M1990 Unspecified osteoarthritis, unspecified site: Secondary | ICD-10-CM | POA: Diagnosis not present

## 2015-05-02 DIAGNOSIS — J849 Interstitial pulmonary disease, unspecified: Secondary | ICD-10-CM | POA: Diagnosis not present

## 2015-05-02 DIAGNOSIS — S52611D Displaced fracture of right ulna styloid process, subsequent encounter for closed fracture with routine healing: Secondary | ICD-10-CM | POA: Diagnosis not present

## 2015-05-02 DIAGNOSIS — S52521D Torus fracture of lower end of right radius, subsequent encounter for fracture with routine healing: Secondary | ICD-10-CM | POA: Diagnosis not present

## 2015-05-02 DIAGNOSIS — M1990 Unspecified osteoarthritis, unspecified site: Secondary | ICD-10-CM | POA: Diagnosis not present

## 2015-05-02 DIAGNOSIS — J449 Chronic obstructive pulmonary disease, unspecified: Secondary | ICD-10-CM | POA: Diagnosis not present

## 2015-05-02 DIAGNOSIS — I2510 Atherosclerotic heart disease of native coronary artery without angina pectoris: Secondary | ICD-10-CM | POA: Diagnosis not present

## 2015-05-03 ENCOUNTER — Encounter: Payer: Self-pay | Admitting: Internal Medicine

## 2015-05-09 DIAGNOSIS — J449 Chronic obstructive pulmonary disease, unspecified: Secondary | ICD-10-CM | POA: Diagnosis not present

## 2015-05-09 DIAGNOSIS — M1990 Unspecified osteoarthritis, unspecified site: Secondary | ICD-10-CM | POA: Diagnosis not present

## 2015-05-09 DIAGNOSIS — I2510 Atherosclerotic heart disease of native coronary artery without angina pectoris: Secondary | ICD-10-CM | POA: Diagnosis not present

## 2015-05-09 DIAGNOSIS — S52521D Torus fracture of lower end of right radius, subsequent encounter for fracture with routine healing: Secondary | ICD-10-CM | POA: Diagnosis not present

## 2015-05-09 DIAGNOSIS — J849 Interstitial pulmonary disease, unspecified: Secondary | ICD-10-CM | POA: Diagnosis not present

## 2015-05-09 DIAGNOSIS — S52611D Displaced fracture of right ulna styloid process, subsequent encounter for closed fracture with routine healing: Secondary | ICD-10-CM | POA: Diagnosis not present

## 2015-05-19 ENCOUNTER — Other Ambulatory Visit: Payer: Self-pay | Admitting: Cardiology

## 2015-05-21 NOTE — Telephone Encounter (Signed)
REFILL 

## 2015-06-01 DIAGNOSIS — Z8781 Personal history of (healed) traumatic fracture: Secondary | ICD-10-CM | POA: Diagnosis not present

## 2015-06-01 DIAGNOSIS — Z967 Presence of other bone and tendon implants: Secondary | ICD-10-CM | POA: Diagnosis not present

## 2015-06-21 ENCOUNTER — Other Ambulatory Visit: Payer: Self-pay

## 2015-06-21 ENCOUNTER — Ambulatory Visit: Payer: Medicare Other | Admitting: Internal Medicine

## 2015-06-21 ENCOUNTER — Encounter: Payer: Self-pay | Admitting: Internal Medicine

## 2015-06-21 ENCOUNTER — Ambulatory Visit (INDEPENDENT_AMBULATORY_CARE_PROVIDER_SITE_OTHER): Payer: Medicare Other | Admitting: Internal Medicine

## 2015-06-21 VITALS — BP 138/86 | HR 76 | Wt 227.0 lb

## 2015-06-21 DIAGNOSIS — J432 Centrilobular emphysema: Secondary | ICD-10-CM | POA: Diagnosis not present

## 2015-06-21 DIAGNOSIS — Z23 Encounter for immunization: Secondary | ICD-10-CM | POA: Diagnosis not present

## 2015-06-21 DIAGNOSIS — G4733 Obstructive sleep apnea (adult) (pediatric): Secondary | ICD-10-CM

## 2015-06-21 DIAGNOSIS — Z9989 Dependence on other enabling machines and devices: Principal | ICD-10-CM

## 2015-06-21 DIAGNOSIS — G4700 Insomnia, unspecified: Secondary | ICD-10-CM

## 2015-06-21 DIAGNOSIS — J849 Interstitial pulmonary disease, unspecified: Secondary | ICD-10-CM | POA: Diagnosis not present

## 2015-06-21 DIAGNOSIS — I6523 Occlusion and stenosis of bilateral carotid arteries: Secondary | ICD-10-CM | POA: Diagnosis not present

## 2015-06-21 MED ORDER — ALPRAZOLAM 0.5 MG PO TABS: 0.5000 mg | ORAL_TABLET | Freq: Every evening | ORAL | Status: DC | PRN

## 2015-06-21 NOTE — Telephone Encounter (Signed)
Printed, please fax or call in to pharmacy. Thank you.   

## 2015-06-21 NOTE — Assessment & Plan Note (Signed)
Patient with previous hospitalization, CT scan  this hospitalization showed chronic interstitial processes and peripheral groundglass opacities. 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 He does have a history of COPD and is currently on Advair to 250/50. Repeat high-resolution CT scan with mild centrilobular and paraseptal emphysema. There was some scattered mild subpleural reticulation, which were asymmetrically distributed predominantly in the lung bases, particularly at on the right side. These were mildly increased from CT scan on 10/28/2014, no other widespread areas of groundglass attenuation noted. Overall there are some mild fibrotic changes in the lung bases which are predominantly in the dependent portion of the right lower lobe and are favored to be areas of post infectious/inflammatory fibrosis, rather than of a more generalized interstitial lung disease. Plan for repeat CT, high-resolution, in 1 year; this will assess temporal changes.   Plan: -Continue with COPD optimization, continue with OSA optimization. -High-resolution CT chest without contrast prior to next visit to assess temporal changes.

## 2015-06-21 NOTE — Assessment & Plan Note (Signed)
Patient of prolonged history of smoking in the past however quit a number of years ago, currently on Advair 250/50.  Plan: -Continue Advair -Continue with tobacco cessation -Continue with diet and exercise -continue using NIVPPV (non-invasive positive pressure ventilation, CPAP) for OSA.

## 2015-06-21 NOTE — Assessment & Plan Note (Signed)
OSA  Discussed sleep data and reviewed with patient.  Encouraged proper weight management.  Excessive weight may contribute to snoring.  Monitor sedative use.  Discussed driving precautions and its relationship with hypersomnolence.  Discussed operating dangerous equipment and its relationship with hypersomnolence.  Discussed sleep hygiene, and benefits of a fixed sleep waked time.  The importance of getting eight or more hours of sleep discussed with patient.  Discussed limiting the use of the computer and television before bedtime.  Decrease naps during the day, so night time sleep will become enhanced.  Limit caffeine, and sleep deprivation.  HTN, stroke, and heart failure are potential risk factors.     Plan: CPAP 12 cm of water, 7 nights per week, minimum 6 hours per night. Does not require supplemental O2 with CPAP anymore.

## 2015-06-21 NOTE — Addendum Note (Signed)
Addended by: Ashley Royalty E on: 06/21/2015 11:44 AM   Modules accepted: Orders

## 2015-06-21 NOTE — Progress Notes (Signed)
[facility] Consultation      MRN# [id number] [name] June 03, 1934   CC: Chief Complaint  Patient presents with  . Follow-up    doing well on CPAP; feels breathing is doing well; no SOB      Brief History: HPI 11/14/14 Patient is a pleasant 81 year old male with a past medical history of hypertension, hyperlipidemia, obstructive sleep apnea on CPAP(not using), coronary artery disease who is seen today for hospital followup of acute respiratory failure. Detailed history as outlined hospital summarization below. Briefly, one week prior to admission patient started having gradual onset of shortness of breath, and confusion, he was seen at the emergency room initially for difficulty with urination, and was discharged from the emergency room at that time. The week progresses confusion shortness of breath continued and he represented to the emergency room chest x-ray showed probable pneumonia at that time. Since discharge patient states that he's been doing well his breathing is back to baseline, and he is back to attending the gym. Of note 1 month prior to admission he was in his usual state of health he attends a gym daily, he has a girlfriend that accompanies him. Further history reveals that he had upper respiratory tract infection urine December, was diagnosed with bronchitis, this cleared, one week prior to hospitalization he started having wheezing initially was thought to have a UTI, but as stated above this was negative, and confusion ensued along with hypoxia leading to admission to the hospital for approximately one week.  Today patient states that he is doing well he did not endorse any worsening shortness of breath, he actually states his shortness of breath is improving, he does have a history of obstructive sleep apnea and was probably on CPAP but has not been wearing CPAP. He was diagnosed with OSA many years ago at sleep med. He denies any drug use,  previously had dogs at home, previously employed in [employer] work, he does have a significant tobacco history previously smoked one to 2 packs per day for 32 years and quit a number of years ago.  Upon discharge from the hospital he was discharged on Advair, 3 L of oxygen continuously even at night and advised to followup to determine his outpatient oxygen needs. During his visit today he had a 6 minute walk test that showed lowest saturation on room air to 89%, he walked 936 feet, his highest heart rate was 108.  Patient also stated that he is currently on BiPAP, but his hospital discharge states CPAP. Plan: PFTs and HRCT, continue with inhalers   ROV 12/06/14: Patient presents today for a follow up visit.  Wearing cpap machine every night. No worsening shortness of breath. Had a repeat titration study done. Started back exercising.   Plan: CPAP 12 cm of water, 7 nights per week, minimum 6 hours per night. Previously on BiPAP, now switched over based on new split study back to CPAP. Cont with 2L at night with CPAP Cont with COPD regiment.   ROV 03/2015: Patient presents today for follow-up visit, he is accompanied by his girlfriend. Patient states overall he is doing well, exercising daily, eating healthy. He uses CPAP nightly about 5-6 hours per night. Patient stated that he recently went on vacation and for about 2-3 nights he did not have supplemental oxygen with CPAP, but checked his O2 saturations in the morning which are consistently in the mid 90s. Patient had a repeat high-resolution CT scan of the chest performed about  a week ago like to discuss the results of it.  Plan: -Continue Advair -Continue with tobacco cessation -Continue with diet and exercise -continue using NIVPPV (non-invasive positive pressure ventilation, CPAP) for OSA. -O2 at night, 2L , will repeat overnight pulse oximetry study, based on results of studies will decide if to stop supplemental oxygen at  night -Continue with COPD optimization, continue with OSA optimization. -High-resolution CT chest without contrast in one year to assess temporal changes  Events since last clinic visit: Patient presents today for a follow up visit of OSA. States that he is doing well, accompanied by friend. Had an ONO in Sept, currently does not qualify for O2 at night anymore. No other complaints.     Medication:   Current Outpatient Rx  Name  Route  Sig  Dispense  Refill  . albuterol (PROVENTIL HFA;VENTOLIN HFA) 108 (90 BASE) MCG/ACT inhaler   Inhalation   Inhale 2 puffs into the lungs every 4 (four) hours as needed for wheezing or shortness of breath.   1 Inhaler   4   . ALPRAZolam (XANAX) 0.5 MG tablet   Oral   Take 1 tablet (0.5 mg total) by mouth at bedtime as needed for anxiety.   30 tablet   0   . apixaban (ELIQUIS) 5 MG TABS tablet   Oral   Take 1 tablet (5 mg total) by mouth 2 (two) times daily.   60 tablet   3   . Ascorbic Acid (VITAMIN C) 1000 MG tablet   Oral   Take 1,000 mg by mouth daily.         . Biotin 1000 MCG tablet   Oral   Take 1,000 mcg by mouth daily.         [name] DEXILANT 60 MG capsule      TAKE ONE CAPSULE BY MOUTH EVERY DAY   30 capsule   11   . ezetimibe (ZETIA) 10 MG tablet   Oral   Take 1 tablet (10 mg total) by mouth daily.   90 tablet   3   . finasteride (PROSCAR) 5 MG tablet   Oral   Take 5 mg by mouth daily.         . Fluticasone-Salmeterol (ADVAIR) 250-50 MCG/DOSE AEPB   Inhalation   Inhale 1 puff into the lungs 2 (two) times daily.   60 each   5   . furosemide (LASIX) 20 MG tablet   Oral   Take 1 tablet (20 mg total) by mouth as needed (as needed for swelling).   30 tablet   3   . lisinopril (PRINIVIL,ZESTRIL) 2.5 MG tablet   Oral   Take 1 tablet (2.5 mg total) by mouth 2 (two) times daily.   180 tablet   6   . magnesium oxide (MAG-OX) 400 MG tablet   Oral   Take 400 mg by mouth daily.         . metoprolol  tartrate (LOPRESSOR) 25 MG tablet      TAKE ONE TABLET BY MOUTH TWICE DAILY   180 tablet   1   . nitroGLYCERIN (NITROSTAT) 0.4 MG SL tablet   Sublingual   Place 1 tablet (0.4 mg total) under the tongue every 5 (five) minutes as needed for chest pain.   25 tablet   3   . rosuvastatin (CRESTOR) 20 MG tablet   Oral   Take 1 tablet (20 mg total) by mouth daily.   90 tablet   2   .  tamsulosin (FLOMAX) 0.4 MG CAPS capsule   Oral   Take 0.4 mg by mouth daily.            Review of Systems  Constitutional: Negative for fever, chills and weight loss.  HENT: Negative for hearing loss.   Eyes: Negative for blurred vision and double vision.  Respiratory: Negative for cough, hemoptysis, sputum production, shortness of breath and wheezing.   Cardiovascular: Negative for chest pain and palpitations.  Gastrointestinal: Negative for heartburn.  Musculoskeletal: Negative for myalgias.  Neurological: Negative for headaches.      Allergies:  Clonazepam; Codeine; and Niacin and related  Physical Examination:  VS: BP 138/86 mmHg  Pulse 76  Wt 227 lb (102.967 kg)  SpO2 93%  General Appearance: No distress  HEENT: PERRLA, no ptosis, no other lesions noticed Pulmonary:normal breath sounds., Mild dry crackles at the bases bilaterally, diaphragmatic excursion normal.No wheezing, No rales   Cardiovascular:  Normal S1,S2.  No m/r/g.     Abdomen:Exam: Benign, Soft, non-tender, No masses  Skin:   warm, no rashes, no ecchymosis  Extremities: normal, no cyanosis, clubbing, warm with normal capillary refill.       Assessment and Plan: 81 year old male presented today for follow-up visit of OSA, COPD, mild ILD. OSA on CPAP OSA  Discussed sleep data and reviewed with patient.  Encouraged proper weight management.  Excessive weight may contribute to snoring.  Monitor sedative use.  Discussed driving precautions and its relationship with hypersomnolence.  Discussed operating dangerous  equipment and its relationship with hypersomnolence.  Discussed sleep hygiene, and benefits of a fixed sleep waked time.  The importance of getting eight or more hours of sleep discussed with patient.  Discussed limiting the use of the computer and television before bedtime.  Decrease naps during the day, so night time sleep will become enhanced.  Limit caffeine, and sleep deprivation.  HTN, stroke, and heart failure are potential risk factors.     Plan: CPAP 12 cm of water, 7 nights per week, minimum 6 hours per night. Does not require supplemental O2 with CPAP anymore.             COPD (chronic obstructive pulmonary disease) Patient of prolonged history of smoking in the past however quit a number of years ago, currently on Advair 250/50.  Plan: -Continue Advair -Continue with tobacco cessation -Continue with diet and exercise -continue using NIVPPV (non-invasive positive pressure ventilation, CPAP) for OSA.         ILD (interstitial lung disease) Patient with previous hospitalization, CT scan  this hospitalization showed chronic interstitial processes and peripheral groundglass opacities. 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 He does have a history of COPD and is currently on Advair to 250/50. Repeat high-resolution CT scan with mild centrilobular and paraseptal emphysema. There was some scattered mild subpleural reticulation, which were asymmetrically distributed predominantly in the lung bases, particularly at on the right side. These were mildly increased from CT scan on 10/28/2014, no other widespread areas of groundglass attenuation noted. Overall there are some mild fibrotic changes in the lung bases which are predominantly in the dependent portion of the right lower lobe and are favored to be areas of post infectious/inflammatory fibrosis, rather than of a more generalized interstitial lung disease. Plan for  repeat CT, high-resolution, in 1 year; this will assess temporal changes.   Plan: -Continue with COPD optimization, continue with OSA optimization. -High-resolution CT chest without contrast prior to next visit to assess temporal  changes.           Updated Medication List Outpatient Encounter Prescriptions as of 06/21/2015  Medication Sig  . albuterol (PROVENTIL HFA;VENTOLIN HFA) 108 (90 BASE) MCG/ACT inhaler Inhale 2 puffs into the lungs every 4 (four) hours as needed for wheezing or shortness of breath.  . ALPRAZolam (XANAX) 0.5 MG tablet Take 1 tablet (0.5 mg total) by mouth at bedtime as needed for anxiety.  [name] apixaban (ELIQUIS) 5 MG TABS tablet Take 1 tablet (5 mg total) by mouth 2 (two) times daily.  . Ascorbic Acid (VITAMIN C) 1000 MG tablet Take 1,000 mg by mouth daily.  . Biotin 1000 MCG tablet Take 1,000 mcg by mouth daily.  [name] DEXILANT 60 MG capsule TAKE ONE CAPSULE BY MOUTH EVERY DAY  . ezetimibe (ZETIA) 10 MG tablet Take 1 tablet (10 mg total) by mouth daily.  . finasteride (PROSCAR) 5 MG tablet Take 5 mg by mouth daily.  . Fluticasone-Salmeterol (ADVAIR DISKUS) 250-50 MCG/DOSE AEPB Inhale 1 Inhaler into the lungs daily.  . Fluticasone-Salmeterol (ADVAIR) 250-50 MCG/DOSE AEPB Inhale 1 puff into the lungs 2 (two) times daily.  . furosemide (LASIX) 20 MG tablet Take 1 tablet (20 mg total) by mouth as needed (as needed for swelling).  [name] lisinopril (PRINIVIL,ZESTRIL) 2.5 MG tablet Take 1 tablet (2.5 mg total) by mouth 2 (two) times daily.  . magnesium oxide (MAG-OX) 400 MG tablet Take 400 mg by mouth daily.  . metoprolol tartrate (LOPRESSOR) 25 MG tablet TAKE ONE TABLET BY MOUTH TWICE DAILY  . nitroGLYCERIN (NITROSTAT) 0.4 MG SL tablet Place 1 tablet (0.4 mg total) under the tongue every 5 (five) minutes as needed for chest pain.  . Omega-3 Fatty Acids (FISH OIL PO) Take 700 Units by mouth daily.  . rosuvastatin (CRESTOR) 20 MG tablet Take 1 tablet (20 mg total) by mouth  daily.  . tamsulosin (FLOMAX) 0.4 MG CAPS capsule Take 0.4 mg by mouth daily.   No facility-administered encounter medications on file as of 06/21/2015.    Orders for this visit: No orders of the defined types were placed in this encounter.    Thank  you for the visitation and for allowing  [facility] to assist in the care of your patient. Our recommendations are noted above.  Please contact us if we can be of further service.  Vilinda Boehringer, MD  Pulmonary and Critical Care Office Number: [phone]

## 2015-06-21 NOTE — Addendum Note (Signed)
Addended by: Oscar La R on: 06/21/2015 12:45 PM   Modules accepted: Orders

## 2015-06-21 NOTE — Addendum Note (Signed)
Addended by: Oscar La R on: 06/21/2015 10:57 AM   Modules accepted: Orders

## 2015-06-21 NOTE — Patient Instructions (Addendum)
Follow up with Dr. Stevenson Clinch in: 6 months - based on recent O2 study, you do not need O2 at night anymore, we will cancel your use of supplemental oxygen. - cont with cpap - cont with tobacco cessation - repeat High Resolution CT without contrast prior to follow up, for changes of ILD. - we will give you a copy of your sleep study and access to MyChart.

## 2015-07-10 DIAGNOSIS — H353231 Exudative age-related macular degeneration, bilateral, with active choroidal neovascularization: Secondary | ICD-10-CM | POA: Diagnosis not present

## 2015-07-13 DIAGNOSIS — H3561 Retinal hemorrhage, right eye: Secondary | ICD-10-CM | POA: Diagnosis not present

## 2015-07-13 DIAGNOSIS — H353132 Nonexudative age-related macular degeneration, bilateral, intermediate dry stage: Secondary | ICD-10-CM | POA: Diagnosis not present

## 2015-07-16 ENCOUNTER — Other Ambulatory Visit: Payer: Self-pay | Admitting: Cardiology

## 2015-07-16 DIAGNOSIS — N401 Enlarged prostate with lower urinary tract symptoms: Secondary | ICD-10-CM | POA: Diagnosis not present

## 2015-07-16 DIAGNOSIS — N138 Other obstructive and reflux uropathy: Secondary | ICD-10-CM | POA: Diagnosis not present

## 2015-08-01 DIAGNOSIS — N138 Other obstructive and reflux uropathy: Secondary | ICD-10-CM | POA: Diagnosis not present

## 2015-08-01 DIAGNOSIS — N401 Enlarged prostate with lower urinary tract symptoms: Secondary | ICD-10-CM | POA: Diagnosis not present

## 2015-08-13 DIAGNOSIS — H3561 Retinal hemorrhage, right eye: Secondary | ICD-10-CM | POA: Diagnosis not present

## 2015-08-13 DIAGNOSIS — H353132 Nonexudative age-related macular degeneration, bilateral, intermediate dry stage: Secondary | ICD-10-CM | POA: Diagnosis not present

## 2015-09-05 ENCOUNTER — Ambulatory Visit (INDEPENDENT_AMBULATORY_CARE_PROVIDER_SITE_OTHER): Payer: Medicare Other | Admitting: Cardiology

## 2015-09-05 ENCOUNTER — Encounter: Payer: Self-pay | Admitting: Cardiology

## 2015-09-05 VITALS — BP 124/78 | HR 77 | Ht 72.0 in | Wt 225.2 lb

## 2015-09-05 DIAGNOSIS — I10 Essential (primary) hypertension: Secondary | ICD-10-CM | POA: Diagnosis not present

## 2015-09-05 DIAGNOSIS — R350 Frequency of micturition: Secondary | ICD-10-CM | POA: Diagnosis not present

## 2015-09-05 DIAGNOSIS — E785 Hyperlipidemia, unspecified: Secondary | ICD-10-CM

## 2015-09-05 DIAGNOSIS — I6529 Occlusion and stenosis of unspecified carotid artery: Secondary | ICD-10-CM

## 2015-09-05 DIAGNOSIS — I480 Paroxysmal atrial fibrillation: Secondary | ICD-10-CM

## 2015-09-05 DIAGNOSIS — N5201 Erectile dysfunction due to arterial insufficiency: Secondary | ICD-10-CM | POA: Diagnosis not present

## 2015-09-05 DIAGNOSIS — I2510 Atherosclerotic heart disease of native coronary artery without angina pectoris: Secondary | ICD-10-CM

## 2015-09-05 DIAGNOSIS — I739 Peripheral vascular disease, unspecified: Secondary | ICD-10-CM | POA: Diagnosis not present

## 2015-09-05 DIAGNOSIS — R351 Nocturia: Secondary | ICD-10-CM | POA: Diagnosis not present

## 2015-09-05 DIAGNOSIS — E291 Testicular hypofunction: Secondary | ICD-10-CM | POA: Diagnosis not present

## 2015-09-05 DIAGNOSIS — R3914 Feeling of incomplete bladder emptying: Secondary | ICD-10-CM | POA: Diagnosis not present

## 2015-09-05 DIAGNOSIS — N401 Enlarged prostate with lower urinary tract symptoms: Secondary | ICD-10-CM | POA: Diagnosis not present

## 2015-09-05 DIAGNOSIS — R3915 Urgency of urination: Secondary | ICD-10-CM | POA: Diagnosis not present

## 2015-09-05 NOTE — Assessment & Plan Note (Signed)
Excellent blood pressure today. We got rid of some dizziness by spacing out his ACE inhibitor dosing - one half dose twice a day. On stable beta blocker dose as well.  No change.

## 2015-09-05 NOTE — Assessment & Plan Note (Signed)
No notable claudication symptoms. Followed by Dr. Oneida Alar from vascular surgery.

## 2015-09-05 NOTE — Progress Notes (Signed)
PCP: Margarita Rana, MD  PULMONOLOGIST: Vilinda Boehringer, MD  Clinic Note: Chief Complaint  Patient presents with  . other    6 month follow up. Meds reviewed by the patient verbally. "doing well."   . Coronary Artery Disease  . Atrial Fibrillation    HPI: [name] is a 82 y.o. male with a PMH below (CAD s/p CABGx4 in 1999, carotid artery occlusion s/p L CEA, ischemic heart disease with chronotropic incompetence, HLD, HTN, OSA on CPAP, aortoiliac occlusive disease s/p common iliac artery stenting 2007) who presents today for 6 month follow-up for CAD and PAF.   I do see him more frequently during the spring and summer months. He had been on as high as 40 mg twice a day Lasix, is now down to 20 mg when necessary.  He was last seen in July 2016 in follow-up from 2 recent hospitalizations. --> Reduce lisinopril back to 2.5 mg twice a day. DC'd diltiazem.  Recent Hospitalizations with Studies:   [facility] Jan 27-28: New-onset A. fib RVR (170) --> identified when he came in for stress test. Was asymptomatic. Rate improved with diltiazem IV followed by spontaneous conversion.  Increased lisinopril to 5 mg twice a day. Anticoagulation with ELIQUIS, Lopressor 25 twice a day plus diltiazem 120 mg daily.  Myoview: No ischemia (see PSH below)  Echocardiogram: No RWMA, normal LV function  (see PSH below)  [facility] February 27-March 4: Shortness of breath/bronchitis with confusion/ AMS --> Acute Renal Failure; however, no apparent CHF.  Not using CPAP - hypercapnia related AMS.  Referred to [facility] (Dr. Stevenson Clinch) w/ Dx of Panlobular Emphysema/COPD + Interstitial Lung Disease (ILD). --> Now he reports wearing CPAP (12 cm H20) every night     Recent Studies:  1. None   Interval History: Returns today, doing pretty well.  Feels well, no more notable edema.  Not having to use any additional dose of Lasix. Is back now to once his routine exercise program doing cardio and other exercises at the  gym. No further episodes of rapid/irregular heartbeats or palpitations.  Back to exercising ~ daily.   Bicycle, light upper body & Dancercise class; [name] Most notable complaint - easy bruising on Eliquis.   Not having any further heart failure or angina symptoms. Overall feeling quite well. The only thing he notes little bit of memory issues between himself and his significant other.  Cardiovascular ROS: positive for - occasional R Leg swelling - taking lasix very rarely. negative for - chest pain, dyspnea on exertion, irregular heartbeat, loss of consciousness, orthopnea, palpitations, paroxysmal nocturnal dyspnea, rapid heart rate, shortness of breath or TIA/Amaurosis Fugax, syncope / near syncope.   Past Medical History  Diagnosis Date  . Atherosclerotic heart disease of native coronary artery without angina pectoris 1998    Multivessel disease on cath for angina: S/p CABG x 4 (LIMA-LAD, SVG-RCA, SVG-OM, SVG-DIAG); SVG-Diag occluded on cath 1999  . Atherosclerotic heart disease of artery bypass graft 1999    a) Cardiac Cath : Occluded SVG-D1;; ostial LM 80-90%, diffuse LAD dz w/ D1 &2 subtotalled, LCx 70% prox with competitive flow from SVG-OM, RCA 100% occluded; patent LIMA-LAD, LC-RCA and SVG-OM; b) TM Myoview 10/2010: Ex 6.5 min - 9 METS, HR to 149, No Ischemia or infarct, PVCs; c) Echo 10/2010: EF >55%, mild AoSclerosis, Conc Remodelling.; Myoview 2/ 2016: EF 55%. Low   risk. No ischemia.  . Carotid artery occlusion without infarction 06/2006    a) High Grade L Carotid  Stenosis on Doppler -> s/x L CEA; b) Carotid Doppler 10/2013:: Left CEA patent, R. ICA 60-70% stenosis  . 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  . Aortoiliac occlusive disease [facility]) April 2007    Status post bilateral common iliac artery stenting  .  Ischemic heart disease with chronotropic incompetence     Seen on CPET to evaluate Exertional Dyspnea. Only able to achieve 80% of peak heart rate; peak VO2 was 75% - but possibly underestimated.  . Hyperlipidemia with target LDL less than 70   . Essential hypertension   . OSA on CPAP   . Erectile dysfunction due to arterial insufficiency   . Sciatic pain     Chronic  . GERD (gastroesophageal reflux disease)   . PAF (paroxysmal atrial fibrillation) [facility]) February 2016    Anticoagulated with Eliquis. On combination beta blocker and calcium channel blocker  . Colon polyps   . TIA (transient ischemic attack)   . Insomnia   . Memory loss   . Lichen planus   . Allergy   . COPD (chronic obstructive pulmonary disease) ([employer])   . Osteoporosis   . Cancer [facility])     Skin cancer    Past Surgical History  Procedure Laterality Date  . Coronary artery bypass graft  1998    LIMA-LAD, SVG-OM, SVG-RPDA, SVG-DIAG  . Cardiac catheterization  01/30/1998      total native LAD andRCA ,mod native CIRC; svg to RCA and OBTUSE MARG. PATENT the left internal mammary artery graft to LAD ; LV NORMAL  /  . Doppler echocardiography  11/20/2010    EF =>55%; LV norm mild aortic scelorosis  . Nm myocar perf wall motion  11/11/2010; February 2016    a) TM STRESS----NORMAL PERFUSION ,EF 68%; b) EF 50-55%. Normal LV function. No significant ischemia or infarction.  [name] Cpet / met  12/2012    Mild chronotropic incompetence - read 82% of predicted; also reduced effort; peak VO2 15.7 / 75% (did not reach Max effort) -- suggested ischemic response in last 1.5 minutes of exercise. Normal pulmonary function on PFTs but poor response to  . Arch aortogram and carotid aortogram  06/02/2006    Dr Oneida Alar did surgery  . Iliac artery stent  12/15/2005    PTA and direct stenting rgt and lft common iliac arteries  . Thoracic aorta - carotid angiogram  October 2007    Dr. 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 Carotid endarterectomy Left Oct. 29, 2007    Dr. Oneida Alar:  . Ankle surgery    . Hiatal hernia repair    . Transthoracic echocardiogram  February 2016    [facility]: Normal LV function. Dilated left atrium.  . Orif wrist fracture Right 03/19/2015    Procedure: OPEN REDUCTION INTERNAL FIXATION (ORIF) WRIST FRACTURE;  Surgeon: Hessie Knows, MD;  Location: [facility] ORS;  Service: Orthopedics;  Laterality: Right;  . Fracture surgery Right 03/19/2015    wrist    ROS: A comprehensive was performed. Review of Systems  Constitutional: Negative for weight loss (actually gained weight) and malaise/fatigue.  HENT: Negative for nosebleeds.   Respiratory: Negative for cough and sputum production.   Cardiovascular: Positive for leg swelling (mostly L ankle).  Gastrointestinal: Negative for blood in stool  and melena.  Genitourinary: Negative for hematuria.  Musculoskeletal: Positive for joint pain (Still has some pain from relatively recent right wrist fracture) and falls (fellow the summer time when his cane got stuck.He broke his wrist).  Neurological: Negative for dizziness, weakness and headaches.       Occasional slow/garbled speech in AM.  Endo/Heme/Allergies: Bruises/bleeds easily.  Psychiatric/Behavioral: Positive for memory loss (just starting to notice some issues). The patient has insomnia (doing better now.).   All other systems reviewed and are negative.   Current Outpatient Prescriptions on File Prior to Visit  Medication Sig Dispense Refill  . albuterol (PROVENTIL HFA;VENTOLIN HFA) 108 (90 BASE) MCG/ACT inhaler Inhale 2 puffs into the lungs every 4 (four) hours as needed for wheezing or shortness of breath. 1 Inhaler 4  . ALPRAZolam (XANAX) 0.5 MG tablet Take 1 tablet (0.5 mg total) by mouth at bedtime as needed for anxiety. 90 tablet 1  . Ascorbic Acid (VITAMIN C) 1000 MG tablet Take 1,000 mg by mouth daily.    . Biotin 1000  MCG tablet Take 1,000 mcg by mouth daily.    [name] DEXILANT 60 MG capsule TAKE ONE CAPSULE BY MOUTH EVERY DAY 30 capsule 11  . ELIQUIS 5 MG TABS tablet TAKE ONE TABLET TWICE DAILY 60 tablet 8  . ezetimibe (ZETIA) 10 MG tablet Take 1 tablet (10 mg total) by mouth daily. 90 tablet 3  . finasteride (PROSCAR) 5 MG tablet Take 5 mg by mouth daily.    . Fluticasone-Salmeterol (ADVAIR) 250-50 MCG/DOSE AEPB Inhale 1 puff into the lungs 2 (two) times daily. 60 each 5  . furosemide (LASIX) 20 MG tablet Take 1 tablet (20 mg total) by mouth as needed (as needed for swelling). 30 tablet 3  . lisinopril (PRINIVIL,ZESTRIL) 2.5 MG tablet Take 1 tablet (2.5 mg total) by mouth 2 (two) times daily. 180 tablet 6  . magnesium oxide (MAG-OX) 400 MG tablet Take 400 mg by mouth daily.    . metoprolol tartrate (LOPRESSOR) 25 MG tablet TAKE ONE TABLET BY MOUTH TWICE DAILY 180 tablet 1  . nitroGLYCERIN (NITROSTAT) 0.4 MG SL tablet Place 1 tablet (0.4 mg total) under the tongue every 5 (five) minutes as needed for chest pain. 25 tablet 3  . rosuvastatin (CRESTOR) 20 MG tablet Take 1 tablet (20 mg total) by mouth daily. 90 tablet 2   No current facility-administered medications on file prior to visit.   Allergies  Allergen Reactions  . Clonazepam Other (See Comments)    Altered mental status  . Codeine   . Niacin And Related     Social History   Social History  . Marital Status: Widowed    Spouse Name: N/A  . Number of Children: N/A  . Years of Education: N/A   Occupational History  . Not on file.   Social History Main Topics  . Smoking status: Former Smoker    Types: Cigarettes    Quit date: 08/04/1997  . Smokeless tobacco: Never Used  . Alcohol Use: No  . Drug Use: No  . Sexual Activity: Not on file   Other Topics Concern  . Not on file   Social History Narrative   He is a widowed father of one, grandfather of 81 with 3 stepchildren. He has been working out vigorously at [employer] the basis. He's up to  doing 20 minutes on the stationary bike and 10-15 minutes on the treadmill. He doesn't smoke or drink.   Family History  Problem Relation  Age of Onset  . Ulcers Mother     Peptic  . Dementia Mother   . Alcohol abuse Father     Wt Readings from Last 3 Encounters:  09/05/15 225 lb 4 oz (102.173 kg)  06/21/15 227 lb (102.967 kg)  04/24/15 219 lb 9.6 oz (99.61 kg)    PHYSICAL EXAM BP 124/78 mmHg  Pulse 77  Ht 6' (1.829 m)  Wt 225 lb 4 oz (102.173 kg)  BMI 30.54 kg/m2  General appearance: A&O X 3, cooperative, appears stated age, no distress and healthy-appearing, well-nourished and well-groomed. Very pleasant mood and affect. Answers questions appropriately.  HEENT: [place]/AT, EOMI, MMM, anicteric sclera Neck: no adenopathy, no carotid bruit, no JVD and supple, symmetrical, trachea midline  Lungs: CTAB, normal percussion bilaterally and Nonlabored, good air movement (slight upper airways "wheezing") Heart: RRR, S1: normal, S2: physiologically split, no S3 or S4; 1/6 c-d/ Early peaking SEM at 2nd right IS-->to carotids and no rub Abdomen: soft, non-tender; bowel sounds normal; no masses, no HSM; mild residual truncal obesity  Extremities: extremities normal, atraumatic, no cyanosis with minimal bilateral lower extremity edema., Pulses: 2+ and symmetric  Neurologic: normal strength and tone. Normal symmetric reflexes. Normal coordination and gait   Adult ECG Report  Rate: 77;  Rhythm: normal sinus rhythm, sinus arrhythmia and 1 A-VBlock & RBBB (PR interval 206), normal axis & intervals.  Narrative Interpretation: Stable.  Other studies Reviewed: Additional studies/ records that were reviewed today include:  Recent Labs:   Lab Results  Component Value Date   CHOL 106 03/15/2015   HDL 31* 03/15/2015   LDLCALC 48 03/15/2015   TRIG 136 03/15/2015   CHOLHDL 3.4 03/15/2015     ASSESSMENT / PLAN: Problem List Items Addressed This Visit    PAF (paroxysmal atrial fibrillation)  (HCC) (Chronic)    Maintaining sinus rhythm. On beta blocker. No longer on diltiazem. This patients CHA2DS2-VASc Score and unadjusted Ischemic Stroke Rate (% per year) is equal to 4.8 % stroke rate/year from a score of 4 Anticoagulated with ELIQUIS. No bleeding issues, but has noted some bruising.      Relevant Orders   EKG 12-Lead   PAD (peripheral artery disease) - bilateral common iliac stents (Chronic)    No notable claudication symptoms. Followed by Dr. Oneida Alar from vascular surgery.      Relevant Orders   EKG 12-Lead   Hyperlipidemia with target LDL less than 70 (Chronic)    Good results of his lipids. HDL was a little low, but LDL was also quite low. This may be simply because the total cholesterol is low. Now would continue with medicines that is being managed by his PCP. Would consider however with some memory loss issues potentially reducing dose of Crestor 80 also asked the possibility of potentially coming off 7. My recommendation would be to continue low-dose statin plus Zetia and avoid higher dose Crestor possible.  Can also convert from Crestor - namebrand to rosuvastatin - generic      Essential hypertension (Chronic)    Excellent blood pressure today. We got rid of some dizziness by spacing out his ACE inhibitor dosing - one half dose twice a day. On stable beta blocker dose as well.  No change.      Relevant Orders   EKG 12-Lead   Carotid artery stenosis - Primary (Chronic)    Monitored by vascular surgery. Stable. No stroke symptoms.      Relevant Orders   EKG 12-Lead   Atherosclerotic heart  disease of native coronary artery without angina pectoris - s/p CABG, occluded SVG-D1; patent LIMA-LAD, SVG-OM, SVG- RCA (Chronic)    He remains very active. Has no active anginal symptoms or heart failure symptoms. Negative Myoview last year - no ischemia. He remains on stable dose of statin, beta blocker and ACE inhibitor. Not on aspirin because he is on anticoagulation  with ELIQUIS.         Current medicines are reviewed at length with the patient today. (+/- concerns) bruising with Eliquis The following changes have been made: no changes.   Your physician recommends that you return for lab work: fasting lipid and CMET. Nothing to eat or drink after midnight the evening before your labs.   Leonie Man, M.D., M.S. Interventional Cardiologist   Pager # [phone]

## 2015-09-05 NOTE — Patient Instructions (Signed)
Medication Instructions:  Your physician recommends that you continue on your current medications as directed. Please refer to the Current Medication list given to you today.   Labwork: none  Testing/Procedures: none  Follow-Up: Your physician wants you to follow-up in: six months with Dr. Harding.  You will receive a reminder letter in the mail two months in advance. If you don't receive a letter, please call our office to schedule the follow-up appointment.   Any Other Special Instructions Will Be Listed Below (If Applicable).     If you need a refill on your cardiac medications before your next appointment, please call your pharmacy.   

## 2015-09-05 NOTE — Assessment & Plan Note (Signed)
He remains very active. Has no active anginal symptoms or heart failure symptoms. Negative Myoview last year - no ischemia. He remains on stable dose of statin, beta blocker and ACE inhibitor. Not on aspirin because he is on anticoagulation with ELIQUIS.

## 2015-09-05 NOTE — Assessment & Plan Note (Signed)
Good results of his lipids. HDL was a little low, but LDL was also quite low. This may be simply because the total cholesterol is low. Now would continue with medicines that is being managed by his PCP. Would consider however with some memory loss issues potentially reducing dose of Crestor 80 also asked the possibility of potentially coming off 7. My recommendation would be to continue low-dose statin plus Zetia and avoid higher dose Crestor possible.  Can also convert from Crestor - namebrand to rosuvastatin - generic

## 2015-09-05 NOTE — Assessment & Plan Note (Addendum)
Maintaining sinus rhythm. On beta blocker. No longer on diltiazem. This patients CHA2DS2-VASc Score and unadjusted Ischemic Stroke Rate (% per year) is equal to 4.8 % stroke rate/year from a score of 4 Anticoagulated with ELIQUIS. No bleeding issues, but has noted some bruising.

## 2015-09-05 NOTE — Assessment & Plan Note (Signed)
Monitored by vascular surgery. Stable. No stroke symptoms.

## 2015-09-08 ENCOUNTER — Other Ambulatory Visit: Payer: Self-pay | Admitting: Family Medicine

## 2015-09-08 DIAGNOSIS — E785 Hyperlipidemia, unspecified: Secondary | ICD-10-CM

## 2015-09-24 DIAGNOSIS — H3561 Retinal hemorrhage, right eye: Secondary | ICD-10-CM | POA: Diagnosis not present

## 2015-10-05 ENCOUNTER — Other Ambulatory Visit: Payer: Self-pay | Admitting: Cardiology

## 2015-10-05 NOTE — Telephone Encounter (Signed)
Rx request sent to pharmacy.  

## 2015-10-11 ENCOUNTER — Encounter: Payer: Self-pay | Admitting: Family

## 2015-10-11 ENCOUNTER — Ambulatory Visit (INDEPENDENT_AMBULATORY_CARE_PROVIDER_SITE_OTHER)
Admission: RE | Admit: 2015-10-11 | Discharge: 2015-10-11 | Disposition: A | Discharge status: Home / Self Care | Payer: Medicare Other | Source: Ambulatory Visit | Attending: Family | Admitting: Family

## 2015-10-11 ENCOUNTER — Ambulatory Visit (HOSPITAL_COMMUNITY)
Admission: RE | Admit: 2015-10-11 | Discharge: 2015-10-11 | Disposition: A | Discharge status: Home / Self Care | Payer: Medicare Other | Source: Ambulatory Visit | Attending: Family | Admitting: Family

## 2015-10-11 DIAGNOSIS — R938 Abnormal findings on diagnostic imaging of other specified body structures: Secondary | ICD-10-CM | POA: Insufficient documentation

## 2015-10-11 DIAGNOSIS — I10 Essential (primary) hypertension: Secondary | ICD-10-CM | POA: Insufficient documentation

## 2015-10-11 DIAGNOSIS — Z48812 Encounter for surgical aftercare following surgery on the circulatory system: Secondary | ICD-10-CM

## 2015-10-11 DIAGNOSIS — E785 Hyperlipidemia, unspecified: Secondary | ICD-10-CM | POA: Diagnosis not present

## 2015-10-11 DIAGNOSIS — I6521 Occlusion and stenosis of right carotid artery: Secondary | ICD-10-CM | POA: Diagnosis not present

## 2015-10-11 DIAGNOSIS — Z95828 Presence of other vascular implants and grafts: Secondary | ICD-10-CM | POA: Diagnosis not present

## 2015-10-11 DIAGNOSIS — I739 Peripheral vascular disease, unspecified: Secondary | ICD-10-CM | POA: Diagnosis not present

## 2015-10-18 ENCOUNTER — Encounter: Payer: Self-pay | Admitting: Family

## 2015-10-18 ENCOUNTER — Ambulatory Visit (INDEPENDENT_AMBULATORY_CARE_PROVIDER_SITE_OTHER): Payer: Medicare Other | Admitting: Family

## 2015-10-18 VITALS — BP 159/94 | HR 77 | Temp 98.0°F | Resp 18 | Ht 72.0 in | Wt 220.0 lb

## 2015-10-18 DIAGNOSIS — Z4889 Encounter for other specified surgical aftercare: Secondary | ICD-10-CM | POA: Diagnosis not present

## 2015-10-18 DIAGNOSIS — I714 Abdominal aortic aneurysm, without rupture, unspecified: Secondary | ICD-10-CM

## 2015-10-18 DIAGNOSIS — Z95828 Presence of other vascular implants and grafts: Secondary | ICD-10-CM | POA: Diagnosis not present

## 2015-10-18 DIAGNOSIS — Z48812 Encounter for surgical aftercare following surgery on the circulatory system: Secondary | ICD-10-CM

## 2015-10-18 DIAGNOSIS — I779 Disorder of arteries and arterioles, unspecified: Secondary | ICD-10-CM

## 2015-10-18 DIAGNOSIS — I6523 Occlusion and stenosis of bilateral carotid arteries: Secondary | ICD-10-CM | POA: Diagnosis not present

## 2015-10-18 DIAGNOSIS — Z9889 Other specified postprocedural states: Secondary | ICD-10-CM

## 2015-10-18 NOTE — Progress Notes (Signed)
[facility] [place] HISTORY AND PHYSICAL   MRN : [id number]  History of Present Illness:   [name] is a 82 y.o. male patient of Dr. Oneida Alar who is status post left carotid endarterectomy in 2007.  He has also previously had stenting of the left and right common iliac arteries. He returns today for follow up of his extracranial carotid artery stenosis and PAOD.  He had some complaints in his lower extremities in the past but no longer c/o claudication.  He is currently on aspirin once daily. He denies any claudication symptoms at this point. However he states he has been diagnosed with sacroiliac joint problems and has some fatigue in his lower extremities with walking long distances. Chronic medical problems include hypertension, hyperlipidemia coronary artery disease all of which are currently stable.   Pt states he was told that he might have had a couple of TIA's about 2013.  The patient reports New Medical or Surgical History: newly diagnosed with macular degeneration. Right wrist ORIF July 2016, feeling in the right fingers has not returned to normal yet. Diagnosed with a-fib and uncontrolled hypertension January 2016, hospitalized for 1 day at [facility].  He has a remote hx of left ankle fracture and repair.   He does not use his CPAP any longer  He was doing a mild work out daily at a gym until he stumbled and fell 2 weeks ago, February 2017, and bruised his knee and shoulder. Pt has CAD, history of atrial fib, his cardiologist is Dr. [name]. Dr. Stevenson Clinch, in [place], is his pulmonologist, will be seeing him soon, has a chest CT pending for April 2017, per pt's friend  Pt Diabetic: No Pt smoker: former smoker, quit in 1998  Pt meds include: Statin : Yes ASA: No Other anticoagulants/antiplatelets: Eliquis, history of atrial fib    Current Outpatient Prescriptions  Medication Sig Dispense Refill  . albuterol (PROVENTIL  HFA;VENTOLIN HFA) 108 (90 BASE) MCG/ACT inhaler Inhale 2 puffs into the lungs every 4 (four) hours as needed for wheezing or shortness of breath. 1 Inhaler 4  . ALPRAZolam (XANAX) 0.5 MG tablet Take 1 tablet (0.5 mg total) by mouth at bedtime as needed for anxiety. 90 tablet 1  . Ascorbic Acid (VITAMIN C) 1000 MG tablet Take 1,000 mg by mouth daily.    . Biotin 1000 MCG tablet Take 1,000 mcg by mouth daily.    [name] DEXILANT 60 MG capsule TAKE ONE CAPSULE BY MOUTH EVERY DAY 30 capsule 11  . ELIQUIS 5 MG TABS tablet TAKE ONE TABLET TWICE DAILY 60 tablet 8  . finasteride (PROSCAR) 5 MG tablet Take 5 mg by mouth daily.    . Fluticasone-Salmeterol (ADVAIR) 250-50 MCG/DOSE AEPB Inhale 1 puff into the lungs 2 (two) times daily. 60 each 5  . furosemide (LASIX) 20 MG tablet Take 1 tablet (20 mg total) by mouth as needed (as needed for swelling). 30 tablet 3  . lisinopril (PRINIVIL,ZESTRIL) 2.5 MG tablet Take 1 tablet (2.5 mg total) by mouth 2 (two) times daily. 180 tablet 6  . magnesium oxide (MAG-OX) 400 MG tablet Take 400 mg by mouth daily.    . metoprolol tartrate (LOPRESSOR) 25 MG tablet TAKE ONE TABLET TWICE DAILY 60 tablet 5  . nitroGLYCERIN (NITROSTAT) 0.4 MG SL tablet Place 1 tablet (0.4 mg total) under the tongue every 5 (five) minutes as needed for chest pain. 25 tablet 3  . rosuvastatin (CRESTOR) 20 MG tablet Take 1 tablet (  20 mg total) by mouth daily. 90 tablet 2  . ZETIA 10 MG tablet TAKE ONE TABLET EVERY DAY 90 tablet 1   No current facility-administered medications for this visit.    Past Medical History  Diagnosis Date  . Atherosclerotic heart disease of native coronary artery without angina pectoris 1998    Multivessel disease on cath for angina: S/p CABG x 4 (LIMA-LAD, SVG-RCA, SVG-OM, SVG-DIAG); SVG-Diag occluded on cath 1999  . Atherosclerotic heart disease of artery bypass graft 1999    a) Cardiac Cath : Occluded SVG-D1;; ostial LM 80-90%, diffuse LAD dz w/ D1 &2 subtotalled, LCx 70%  prox with competitive flow from SVG-OM, RCA 100% occluded; patent LIMA-LAD, LC-RCA and SVG-OM; b) TM Myoview 10/2010: Ex 6.5 min - 9 METS, HR to 149, No Ischemia or infarct, PVCs; c) Echo 10/2010: EF >55%, mild AoSclerosis, Conc Remodelling.; Myoview 2/ 2016: EF 55%. Low   risk. No ischemia.  . Carotid artery occlusion without infarction 06/2006    a) High Grade L Carotid Stenosis on Doppler -> s/x L CEA; b) Carotid Doppler 10/2013:: Left CEA patent, R. ICA 60-70% stenosis  . 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  . Aortoiliac occlusive disease [facility]) April 2007    Status post bilateral common iliac artery stenting  . Ischemic heart disease with chronotropic incompetence     Seen on CPET to evaluate Exertional Dyspnea. Only able to achieve 80% of peak heart rate; peak VO2 was 75% - but possibly underestimated.  . Hyperlipidemia with target LDL less than 70   . Essential hypertension   . OSA on CPAP   . Erectile dysfunction due to arterial insufficiency   . Sciatic pain     Chronic  . GERD (gastroesophageal reflux disease)   . PAF (paroxysmal atrial fibrillation) [facility]) February 2016    Anticoagulated with Eliquis. On combination beta blocker and calcium channel blocker  . Colon polyps   . TIA (transient ischemic attack)   . Insomnia   . Memory loss   . Lichen planus   . Allergy   . COPD (chronic obstructive pulmonary disease) ([place])   . Osteoporosis   . Cancer [facility])     Skin cancer    Social History Social History  Substance Use Topics  . Smoking status: Former Smoker    Types: Cigarettes    Quit date: 08/04/1997  . Smokeless tobacco: Never Used  . Alcohol Use: No    Family History Family History  Problem Relation Age of Onset  . Ulcers Mother     Peptic  . Dementia Mother   . Alcohol abuse  Father     Surgical History Past Surgical History  Procedure Laterality Date  . Coronary artery bypass graft  1998    LIMA-LAD, SVG-OM, SVG-RPDA, SVG-DIAG  . Cardiac catheterization  01/30/1998      total native LAD andRCA ,mod native CIRC; svg to RCA and OBTUSE MARG. PATENT the left internal mammary artery graft to LAD ; LV NORMAL  /  . Doppler echocardiography  11/20/2010    EF =>55%; LV norm mild aortic scelorosis  . Nm myocar perf wall motion  11/11/2010; February 2016    a) TM STRESS----NORMAL PERFUSION ,EF 68%; b) EF 50-55%. Normal LV function. No significant ischemia or infarction.  [name] Cpet / met  12/2012  Mild chronotropic incompetence - read 82% of predicted; also reduced effort; peak VO2 15.7 / 75% (did not reach Max effort) -- suggested ischemic response in last 1.5 minutes of exercise. Normal pulmonary function on PFTs but poor response to  . Arch aortogram and carotid aortogram  06/02/2006    Dr Oneida Alar did surgery  . Iliac artery stent  12/15/2005    PTA and direct stenting rgt and lft common iliac arteries  . Thoracic aorta - carotid angiogram  October 2007    Dr. 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 Carotid endarterectomy Left Oct. 29, 2007    Dr. Oneida Alar:  . Ankle surgery    . Hiatal hernia repair    . Transthoracic echocardiogram  February 2016    [facility]: Normal LV function. Dilated left atrium.  . Orif wrist fracture Right 03/19/2015    Procedure: OPEN REDUCTION INTERNAL FIXATION (ORIF) WRIST FRACTURE;  Surgeon: Hessie Knows, MD;  Location: [facility] ORS;  Service: Orthopedics;  Laterality: Right;  . Fracture surgery Right 03/19/2015    wrist    Allergies  Allergen Reactions  . Clonazepam Other (See Comments)    Altered mental status  . Codeine   . Niacin And Related     Current Outpatient Prescriptions  Medication Sig Dispense Refill  . albuterol (PROVENTIL HFA;VENTOLIN  HFA) 108 (90 BASE) MCG/ACT inhaler Inhale 2 puffs into the lungs every 4 (four) hours as needed for wheezing or shortness of breath. 1 Inhaler 4  . ALPRAZolam (XANAX) 0.5 MG tablet Take 1 tablet (0.5 mg total) by mouth at bedtime as needed for anxiety. 90 tablet 1  . Ascorbic Acid (VITAMIN C) 1000 MG tablet Take 1,000 mg by mouth daily.    . Biotin 1000 MCG tablet Take 1,000 mcg by mouth daily.    [name] DEXILANT 60 MG capsule TAKE ONE CAPSULE BY MOUTH EVERY DAY 30 capsule 11  . ELIQUIS 5 MG TABS tablet TAKE ONE TABLET TWICE DAILY 60 tablet 8  . finasteride (PROSCAR) 5 MG tablet Take 5 mg by mouth daily.    . Fluticasone-Salmeterol (ADVAIR) 250-50 MCG/DOSE AEPB Inhale 1 puff into the lungs 2 (two) times daily. 60 each 5  . furosemide (LASIX) 20 MG tablet Take 1 tablet (20 mg total) by mouth as needed (as needed for swelling). 30 tablet 3  . lisinopril (PRINIVIL,ZESTRIL) 2.5 MG tablet Take 1 tablet (2.5 mg total) by mouth 2 (two) times daily. 180 tablet 6  . magnesium oxide (MAG-OX) 400 MG tablet Take 400 mg by mouth daily.    . metoprolol tartrate (LOPRESSOR) 25 MG tablet TAKE ONE TABLET TWICE DAILY 60 tablet 5  . nitroGLYCERIN (NITROSTAT) 0.4 MG SL tablet Place 1 tablet (0.4 mg total) under the tongue every 5 (five) minutes as needed for chest pain. 25 tablet 3  . rosuvastatin (CRESTOR) 20 MG tablet Take 1 tablet (20 mg total) by mouth daily. 90 tablet 2  . ZETIA 10 MG tablet TAKE ONE TABLET EVERY DAY 90 tablet 1   No current facility-administered medications for this visit.     REVIEW OF SYSTEMS: See HPI for pertinent positives and negatives.  Physical Examination Filed Vitals:   10/18/15 0919 10/18/15 0926 10/18/15 0927  BP: 169/95 135/77 159/94  Pulse: 78 72 77  Temp: 98 F (36.7 C)    TempSrc: Oral    Resp: 18    Height: 6' (1.829 m)    Weight:  220 lb (99.791 kg)    SpO2: 95%     Body mass index is 29.83 kg/(m^2).    General: WDWN male in NAD, accompanied by friend GAIT:  normal Eyes: PERRLA Pulmonary: Non-labored, CTAB Cardiac: regular rhythm, no detected murmur.  VASCULAR EXAM Carotid Bruits Right Left   Negative Negative   Aorta is not palpable. Radial pulses are 2+ palpable and equal.      LE Pulses Right Left   FEMORAL 1+ palpable 1+ palpable   POPLITEAL not palpable  not palpable   POSTERIOR TIBIAL faintly palpable  faintly palpable    DORSALIS PEDIS  ANTERIOR TIBIAL 2+ palpable  Not palpable     Gastrointestinal: soft, nontender, BS WNL, no r/g, no palpated masses.  Musculoskeletal: No muscle atrophy/wasting. M/S 5/5 throughout, Extremities without ischemic changes.  Neurologic: A&O X 3; Appropriate Affect, sensation is normal, Speech is normal CN 2-12 intactexcept has some hearing loss, Pain and light touch intact in extremities, Motor exam as listed above.        Non-Invasive Vascular Imaging (10/11/15):  CEREBROVASCULAR DUPLEX EVALUATION    INDICATION: Carotid artery disease    PREVIOUS INTERVENTION(S): Left carotid endarterectomy 06/29/2006 by Dr. Rosetta Posner EXAM: Carotid duplex    RIGHT  LEFT  Peak Systolic Velocities (cm/s) End Diastolic Velocities (cm/s) Plaque LOCATION Peak Systolic Velocities (cm/s) End Diastolic Velocities (cm/s) Plaque  100 16  CCA PROXIMAL 82 18   74 16  CCA MID 94 26 HT  78 16 HT CCA DISTAL 84 22 HT  65 9 HT ECA 60 11 HT  195 60 HT ICA PROXIMAL 80 16 HT  155 45 HT ICA MID 77 25   116 24  ICA DISTAL 61 24     2.6 ICA / CCA Ratio (PSV) N/A  Antegrade Vertebral Flow Antegrade  382 Brachial Systolic Pressure (mmHg) 505  Biphasic Brachial Artery Waveforms Biphasic    Plaque Morphology:  HM = Homogeneous, HT = Heterogeneous, CP = Calcific Plaque, SP = Smooth Plaque, IP = Irregular Plaque     ADDITIONAL  FINDINGS: Multiphasic subclavian arteries    IMPRESSION: 1. 40 - 59% right internal carotid artery stenosis. 2. Patent left carotid endarterectomy site with no evidence for restenosis    Compared to the previous exam:  No change since exam of 10/19/2014      ILIAC ARTERY STENT EVALUATION    INDICATION: Follow up stents    PREVIOUS INTERVENTION(S): Bilateral common iliac artery stents placed 11/2005    DUPLEX EXAM:     RIGHT  LEFT   Peak Systolic Velocity (cm/s) Ratio (if abnormal) Waveform  Peak Systolic Velocity (cm/s) Ratio (if abnormal) Waveform  73  B Aorta - Distal 73  B  110  B Artery - Proximal to Stent 72  Brisk M  176  B Stent - Proximal 165  B  183  B Stent - Mid 134  Brisk M  151  B Stent - Distal 193  B  150  B Artery - Distal to Stent 196  B  1.0 Today's ABI / TBI 0.92  .91 Previous ABI / TBI (10/13/13  ) .85    Waveform:    M - Monophasic       B - Biphasic       T - Triphasic  If Ankle Brachial Index (ABI) or Toe Brachial Index (TBI) performed, please see complete report     ADDITIONAL FINDINGS: Right common femoral artery 98 cm/s ,  left common femoral artery 110 cm/s with both with broad biphasic waveforms. 4.0 cm aortic aneurysm.    IMPRESSION: 1. Patent right common iliac artery stent with no stenosis visualized 2. The left common iliac artery stent appears patent with no stenosis visualized, somewhat difficult to visualize    Compared to the previous exam:  No prior exam      ASSESSMENT:  [name] is a 82 y.o. male who is status post left carotid endarterectomy in 2007.  He has also previously had stenting of the left and right common iliac arteries. Pt states he was told that he might have had a couple of TIA's about 2013.   Today's carotid duplex suggests a 40 - 59% right internal carotid artery stenosis. Patent left carotid endarterectomy site with no evidence for restenosis. No change since exam of 10/19/2014.   He does not have  claudication sx's with walking but he states he has been diagnosed with sacroiliac joint problems and has some fatigue in his lower extremities with walking long distances. He has no ischemic changes in his feet/legs.   Today's bilateral iliac artery stent duplex suggests a 4.0 cm aortic aneurysm. Patent right common iliac artery stent with no stenosis visualized. The left common iliac artery stent appears patent with no stenosis visualized, somewhat difficult to visualize.  Face to face time with patient was 25 minutes. Over 50% of this time was spent on counseling and coordination of care.    PLAN:   Based on today's exam and non-invasive vascular lab results, the patient will follow up in 1 year with Carotid Duplex scan, bilateral aortoiliac artery duplex, and ABI's. I discussed in depth with the patient the nature of atherosclerosis, and emphasized the importance of maximal medical management including strict control of blood pressure, blood glucose, and lipid levels, obtaining regular exercise, and cessation of smoking.  The patient is aware that without maximal medical management the underlying atherosclerotic disease process will progress, limiting the benefit of any interventions.  The patient was given information about stroke prevention and what symptoms should prompt the patient to seek immediate medical care.  The patient was given information about PAD including signs, symptoms, treatment, what symptoms should prompt the patient to seek immediate medical care, and risk reduction measures to take. Thank you for allowing [place] to participate in this patient's care.  Clemon Chambers, RN, MSN, FNP-C [facility] Office: [phone]  Clinic MD: [facility]  10/18/2015 9:15 AM

## 2015-10-18 NOTE — Patient Instructions (Addendum)
Stroke Prevention Some medical conditions and behaviors are associated with an increased chance of having a stroke. You may prevent a stroke by making healthy choices and managing medical conditions. HOW CAN I REDUCE MY RISK OF HAVING A STROKE?   Stay physically active. Get at least 30 minutes of activity on most or all days.  Do not smoke. It may also be helpful to avoid exposure to secondhand smoke.  Limit alcohol use. Moderate alcohol use is considered to be:  No more than 2 drinks per day for men.  No more than 1 drink per day for nonpregnant women.  Eat healthy foods. This involves:  Eating 5 or more servings of fruits and vegetables a day.  Making dietary changes that address high blood pressure (hypertension), high cholesterol, diabetes, or obesity.  Manage your cholesterol levels.  Making food choices that are high in fiber and low in saturated fat, trans fat, and cholesterol may control cholesterol levels.  Take any prescribed medicines to control cholesterol as directed by your health care provider.  Manage your diabetes.  Controlling your carbohydrate and sugar intake is recommended to manage diabetes.  Take any prescribed medicines to control diabetes as directed by your health care provider.  Control your hypertension.  Making food choices that are low in salt (sodium), saturated fat, trans fat, and cholesterol is recommended to manage hypertension.  Ask your health care provider if you need treatment to lower your blood pressure. Take any prescribed medicines to control hypertension as directed by your health care provider.  If you are 18-39 years of age, have your blood pressure checked every 3-5 years. If you are 40 years of age or older, have your blood pressure checked every year.  Maintain a healthy weight.  Reducing calorie intake and making food choices that are low in sodium, saturated fat, trans fat, and cholesterol are recommended to manage  weight.  Stop drug abuse.  Avoid taking birth control pills.  Talk to your health care provider about the risks of taking birth control pills if you are over 35 years old, smoke, get migraines, or have ever had a blood clot.  Get evaluated for sleep disorders (sleep apnea).  Talk to your health care provider about getting a sleep evaluation if you snore a lot or have excessive sleepiness.  Take medicines only as directed by your health care provider.  For some people, aspirin or blood thinners (anticoagulants) are helpful in reducing the risk of forming abnormal blood clots that can lead to stroke. If you have the irregular heart rhythm of atrial fibrillation, you should be on a blood thinner unless there is a good reason you cannot take them.  Understand all your medicine instructions.  Make sure that other conditions (such as anemia or atherosclerosis) are addressed. SEEK IMMEDIATE MEDICAL CARE IF:   You have sudden weakness or numbness of the face, arm, or leg, especially on one side of the body.  Your face or eyelid droops to one side.  You have sudden confusion.  You have trouble speaking (aphasia) or understanding.  You have sudden trouble seeing in one or both eyes.  You have sudden trouble walking.  You have dizziness.  You have a loss of balance or coordination.  You have a sudden, severe headache with no known cause.  You have new chest pain or an irregular heartbeat. Any of these symptoms may represent a serious problem that is an emergency. Do not wait to see if the symptoms will   go away. Get medical help at once. Call your local emergency services (911 in [place] Do not drive yourself to the hospital.   This information is not intended to replace advice given to you by your health care provider. Make sure you discuss any questions you have with your health care provider.   Document Released: 09/25/2004 Document Revised: 09/08/2014 Document Reviewed:  02/18/2013 Elsevier Interactive Patient Education 2016 [employer]     Peripheral Vascular Disease Peripheral vascular disease (PVD) is a disease of the blood vessels that are not part of your heart and brain. A simple term for PVD is poor circulation. In most cases, PVD narrows the blood vessels that carry blood from your heart to the rest of your body. This can result in a decreased supply of blood to your arms, legs, and internal organs, like your stomach or kidneys. However, it most often affects a person's lower legs and feet. There are two types of PVD.  Organic PVD. This is the more common type. It is caused by damage to the structure of blood vessels.  Functional PVD. This is caused by conditions that make blood vessels contract and tighten (spasm). Without treatment, PVD tends to get worse over time. PVD can also lead to acute ischemic limb. This is when an arm or limb suddenly has trouble getting enough blood. This is a medical emergency. CAUSES Each type of PVD has many different causes. The most common cause of PVD is buildup of a fatty material (plaque) inside of your arteries (atherosclerosis). Small amounts of plaque can break off from the walls of the blood vessels and become lodged in a smaller artery. This blocks blood flow and can cause acute ischemic limb. Other common causes of PVD include:  Blood clots that form inside of blood vessels.  Injuries to blood vessels.  Diseases that cause inflammation of blood vessels or cause blood vessel spasms.  Health behaviors and health history that increase your risk of developing PVD. RISK FACTORS  You may have a greater risk of PVD if you:  Have a family history of PVD.  Have certain medical conditions, including:  High cholesterol.  Diabetes.  High blood pressure (hypertension).  Coronary heart disease.  Past problems with blood clots.  Past injury, such as burns or a broken bone. These may have damaged blood  vessels in your limbs.  Buerger disease. This is caused by inflamed blood vessels in your hands and feet.  Some forms of arthritis.  Rare birth defects that affect the arteries in your legs.  Use tobacco.  Do not get enough exercise.  Are obese.  Are age 50 or older. SIGNS AND SYMPTOMS  PVD may cause many different symptoms. Your symptoms depend on what part of your body is not getting enough blood. Some common signs and symptoms include:  Cramps in your lower legs. This may be a symptom of poor leg circulation (claudication).  Pain and weakness in your legs while you are physically active that goes away when you rest (intermittent claudication).  Leg pain when at rest.  Leg numbness, tingling, or weakness.  Coldness in a leg or foot, especially when compared with the other leg.  Skin or hair changes. These can include:  Hair loss.  Shiny skin.  Pale or bluish skin.  Thick toenails.  Inability to get or maintain an erection (erectile dysfunction). People with PVD are more prone to developing ulcers and sores on their toes, feet, or legs. These may take longer   than normal to heal. DIAGNOSIS Your health care provider may diagnose PVD from your signs and symptoms. The health care provider will also do a physical exam. You may have tests to find out what is causing your PVD and determine its severity. Tests may include:  Blood pressure recordings from your arms and legs and measurements of the strength of your pulses (pulse volume recordings).  Imaging studies using sound waves to take pictures of the blood flow through your blood vessels (Doppler ultrasound).  Injecting a dye into your blood vessels before having imaging studies using:  X-rays (angiogram or arteriogram).  Computer-generated X-rays (CT angiogram).  A powerful electromagnetic field and a computer (magnetic resonance angiogram or MRA). TREATMENT Treatment for PVD depends on the cause of your condition  and the severity of your symptoms. It also depends on your age. Underlying causes need to be treated and controlled. These include long-lasting (chronic) conditions, such as diabetes, high cholesterol, and high blood pressure. You may need to first try making lifestyle changes and taking medicines. Surgery may be needed if these do not work. Lifestyle changes may include:  Quitting smoking.  Exercising regularly.  Following a low-fat, low-cholesterol diet. Medicines may include:  Blood thinners to prevent blood clots.  Medicines to improve blood flow.  Medicines to improve your blood cholesterol levels. Surgical procedures may include:  A procedure that uses an inflated balloon to open a blocked artery and improve blood flow (angioplasty).  A procedure to put in a tube (stent) to keep a blocked artery open (stent implant).  Surgery to reroute blood flow around a blocked artery (peripheral bypass surgery).  Surgery to remove dead tissue from an infected wound on the affected limb.  Amputation. This is surgical removal of the affected limb. This may be necessary in cases of acute ischemic limb that are not improved through medical or surgical treatments. HOME CARE INSTRUCTIONS  Take medicines only as directed by your health care provider.  Do not use any tobacco products, including cigarettes, chewing tobacco, or electronic cigarettes. If you need help quitting, ask your health care provider.  Lose weight if you are overweight, and maintain a healthy weight as directed by your health care provider.  Eat a diet that is low in fat and cholesterol. If you need help, ask your health care provider.  Exercise regularly. Ask your health care provider to suggest some good activities for you.  Use compression stockings or other mechanical devices as directed by your health care provider.  Take good care of your feet.  Wear comfortable shoes that fit well.  Check your feet often for  any cuts or sores. SEEK MEDICAL CARE IF:  You have cramps in your legs while walking.  You have leg pain when you are at rest.  You have coldness in a leg or foot.  Your skin changes.  You have erectile dysfunction.  You have cuts or sores on your feet that are not healing. SEEK IMMEDIATE MEDICAL CARE IF:  Your arm or leg turns cold and blue.  Your arms or legs become red, warm, swollen, painful, or numb.  You have chest pain or trouble breathing.  You suddenly have weakness in your face, arm, or leg.  You become very confused or lose the ability to speak.  You suddenly have a very bad headache or lose your vision.   This information is not intended to replace advice given to you by your health care provider. Make sure you discuss any   questions you have with your health care provider.   Document Released: 09/25/2004 Document Revised: 09/08/2014 Document Reviewed: 01/26/2014 Elsevier Interactive Patient Education 2016 [employer]     Abdominal Aortic Aneurysm An aneurysm is a weakened or damaged part of an artery wall that bulges from the normal force of blood pumping through the body. An abdominal aortic aneurysm is an aneurysm that occurs in the lower part of the aorta, the main artery of the body.  The major concern with an abdominal aortic aneurysm is that it can enlarge and burst (rupture) or blood can flow between the layers of the wall of the aorta through a tear (aorticdissection). Both of these conditions can cause bleeding inside the body and can be life threatening unless diagnosed and treated promptly. CAUSES  The exact cause of an abdominal aortic aneurysm is unknown. Some contributing factors are:   A hardening of the arteries caused by the buildup of fat and other substances in the lining of a blood vessel (arteriosclerosis).  Inflammation of the walls of an artery (arteritis).   Connective tissue diseases, such as Marfan syndrome.   Abdominal  trauma.   An infection, such as syphilis or staphylococcus, in the wall of the aorta (infectious aortitis) caused by bacteria. RISK FACTORS  Risk factors that contribute to an abdominal aortic aneurysm may include:  Age older than 60 years.   High blood pressure (hypertension).  Male gender.  Ethnicity (white race).  Obesity.  Family history of aneurysm (first degree relatives only).  Tobacco use. PREVENTION  The following healthy lifestyle habits may help decrease your risk of abdominal aortic aneurysm:  Quitting smoking. Smoking can raise your blood pressure and cause arteriosclerosis.  Limiting or avoiding alcohol.  Keeping your blood pressure, blood sugar level, and cholesterol levels within normal limits.  Decreasing your salt intake. In somepeople, too much salt can raise blood pressure and increase your risk of abdominal aortic aneurysm.  Eating a diet low in saturated fats and cholesterol.  Increasing your fiber intake by including whole grains, vegetables, and fruits in your diet. Eating these foods may help lower blood pressure.  Maintaining a healthy weight.  Staying physically active and exercising regularly. SYMPTOMS  The symptoms of abdominal aortic aneurysm may vary depending on the size and rate of growth of the aneurysm.Most grow slowly and do not have any symptoms. When symptoms do occur, they may include:  Pain (abdomen, side, lower back, or groin). The pain may vary in intensity. A sudden onset of severe pain may indicate that the aneurysm has ruptured.  Feeling full after eating only small amounts of food.  Nausea or vomiting or both.  Feeling a pulsating lump in the abdomen.  Feeling faint or passing out. DIAGNOSIS  Since most unruptured abdominal aortic aneurysms have no symptoms, they are often discovered during diagnostic exams for other conditions. An aneurysm may be found during the following procedures:  Ultrasonography (A one-time  screening for abdominal aortic aneurysm by ultrasonography is also recommended for all men aged 65-75 years who have ever smoked).  X-ray exams.  A computed tomography (CT).  Magnetic resonance imaging (MRI).  Angiography or arteriography. TREATMENT  Treatment of an abdominal aortic aneurysm depends on the size of your aneurysm, your age, and risk factors for rupture. Medication to control blood pressure and pain may be used to manage aneurysms smaller than 6 cm. Regular monitoring for enlargement may be recommended by your caregiver if:  The aneurysm is 3-4 cm in size (  an annual ultrasonography may be recommended).  The aneurysm is 4-4.5 cm in size (an ultrasonography every 6 months may be recommended).  The aneurysm is larger than 4.5 cm in size (your caregiver may ask that you be examined by a vascular surgeon). If your aneurysm is larger than 6 cm, surgical repair may be recommended. There are two main methods for repair of an aneurysm:   Endovascular repair (a minimally invasive surgery). This is done most often.  Open repair. This method is used if an endovascular repair is not possible.   This information is not intended to replace advice given to you by your health care provider. Make sure you discuss any questions you have with your health care provider.   Document Released: 05/28/2005 Document Revised: 12/13/2012 Document Reviewed: 09/17/2012 Elsevier Interactive Patient Education 2016 [employer]  

## 2015-10-24 ENCOUNTER — Other Ambulatory Visit: Payer: Self-pay

## 2015-10-24 DIAGNOSIS — I779 Disorder of arteries and arterioles, unspecified: Secondary | ICD-10-CM

## 2015-11-26 DIAGNOSIS — H353112 Nonexudative age-related macular degeneration, right eye, intermediate dry stage: Secondary | ICD-10-CM | POA: Diagnosis not present

## 2015-11-26 DIAGNOSIS — R3915 Urgency of urination: Secondary | ICD-10-CM | POA: Diagnosis not present

## 2015-11-26 DIAGNOSIS — R351 Nocturia: Secondary | ICD-10-CM | POA: Diagnosis not present

## 2015-11-26 DIAGNOSIS — R350 Frequency of micturition: Secondary | ICD-10-CM | POA: Diagnosis not present

## 2015-11-26 DIAGNOSIS — N401 Enlarged prostate with lower urinary tract symptoms: Secondary | ICD-10-CM | POA: Diagnosis not present

## 2015-11-30 ENCOUNTER — Encounter: Payer: Self-pay | Admitting: Urgent Care

## 2015-11-30 ENCOUNTER — Emergency Department
Admission: EM | Admit: 2015-11-30 | Discharge: 2015-11-30 | Disposition: A | Discharge status: Home / Self Care | Payer: Medicare Other | Attending: Emergency Medicine | Admitting: Emergency Medicine

## 2015-11-30 ENCOUNTER — Emergency Department: Payer: Medicare Other

## 2015-11-30 DIAGNOSIS — Y998 Other external cause status: Secondary | ICD-10-CM | POA: Insufficient documentation

## 2015-11-30 DIAGNOSIS — Z7901 Long term (current) use of anticoagulants: Secondary | ICD-10-CM | POA: Diagnosis not present

## 2015-11-30 DIAGNOSIS — Z87891 Personal history of nicotine dependence: Secondary | ICD-10-CM | POA: Diagnosis not present

## 2015-11-30 DIAGNOSIS — Z7951 Long term (current) use of inhaled steroids: Secondary | ICD-10-CM | POA: Insufficient documentation

## 2015-11-30 DIAGNOSIS — Y9389 Activity, other specified: Secondary | ICD-10-CM | POA: Diagnosis not present

## 2015-11-30 DIAGNOSIS — S42254A Nondisplaced fracture of greater tuberosity of right humerus, initial encounter for closed fracture: Secondary | ICD-10-CM | POA: Diagnosis not present

## 2015-11-30 DIAGNOSIS — W010XXA Fall on same level from slipping, tripping and stumbling without subsequent striking against object, initial encounter: Secondary | ICD-10-CM | POA: Diagnosis not present

## 2015-11-30 DIAGNOSIS — M7989 Other specified soft tissue disorders: Secondary | ICD-10-CM | POA: Diagnosis not present

## 2015-11-30 DIAGNOSIS — I10 Essential (primary) hypertension: Secondary | ICD-10-CM | POA: Diagnosis not present

## 2015-11-30 DIAGNOSIS — Y9289 Other specified places as the place of occurrence of the external cause: Secondary | ICD-10-CM | POA: Diagnosis not present

## 2015-11-30 DIAGNOSIS — S4991XA Unspecified injury of right shoulder and upper arm, initial encounter: Secondary | ICD-10-CM | POA: Diagnosis present

## 2015-11-30 DIAGNOSIS — E785 Hyperlipidemia, unspecified: Secondary | ICD-10-CM | POA: Diagnosis not present

## 2015-11-30 DIAGNOSIS — S4291XA Fracture of right shoulder girdle, part unspecified, initial encounter for closed fracture: Secondary | ICD-10-CM

## 2015-11-30 DIAGNOSIS — Z79899 Other long term (current) drug therapy: Secondary | ICD-10-CM | POA: Diagnosis not present

## 2015-11-30 MED ORDER — TRAMADOL HCL 50 MG PO TABS: 50.0000 mg | ORAL_TABLET | Freq: Two times a day (BID) | ORAL | Status: DC | PRN

## 2015-11-30 NOTE — ED Notes (Signed)
Patient reports he fell this morning after getting out of bed and tripped on the covers falling onto right shoulder.  Patient does have shoulder immobile at this time.  States injury happened around 0500 this morning. Patient states around 0630 he did take 2 Tylenol along with his morning medications which include Eliquis, Dexilant, Metoprolol, Lisinopril, Vitamin C, Biotin and Preservision.  Patient is alert and oriented.

## 2015-11-30 NOTE — Discharge Instructions (Signed)
Wear arm sling until evaluation by orthopedics clinic.

## 2015-11-30 NOTE — ED Notes (Signed)
Patient instructed to make follow up appt with Dr. Rudene Christians as soon as possible.

## 2015-11-30 NOTE — ED Provider Notes (Signed)
[facility] Emergency Department Provider Note  ____________________________________________  Time seen: Approximately 7:26 AM  I have reviewed the triage vital signs and the nursing notes.   HISTORY  Chief Complaint Shoulder Pain    HPI [name] is a 82 y.o. male patient complaining of right shoulder pain secondary to a fall. Patient state he was getting out of bed and and tripped over bedspread and fell fall landing on his right shoulder.Patient is rating his pain as 2/10. Patient state pain becomes a 10 over 10 with abduction of the shoulder and and overhead reaching. Patient is taking a blood thinner. No palliative measures taken prior to arrival. Patient describes pain as "achy".   Past Medical History  Diagnosis Date  . Atherosclerotic heart disease of native coronary artery without angina pectoris 1998    Multivessel disease on cath for angina: S/p CABG x 4 (LIMA-LAD, SVG-RCA, SVG-OM, SVG-DIAG); SVG-Diag occluded on cath 1999  . Atherosclerotic heart disease of artery bypass graft 1999    a) Cardiac Cath : Occluded SVG-D1;; ostial LM 80-90%, diffuse LAD dz w/ D1 &2 subtotalled, LCx 70% prox with competitive flow from SVG-OM, RCA 100% occluded; patent LIMA-LAD, LC-RCA and SVG-OM; b) TM Myoview 10/2010: Ex 6.5 min - 9 METS, HR to 149, No Ischemia or infarct, PVCs; c) Echo 10/2010: EF >55%, mild AoSclerosis, Conc Remodelling.; Myoview 2/ 2016: EF 55%. Low   risk. No ischemia.  . Carotid artery occlusion without infarction 06/2006    a) High Grade L Carotid Stenosis on Doppler -> s/x L CEA; b) Carotid Doppler 10/2013:: Left CEA patent, R. ICA 60-70% stenosis  . 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  . Aortoiliac occlusive disease [facility]) April 2007    Status post bilateral  common iliac artery stenting  . Ischemic heart disease with chronotropic incompetence     Seen on CPET to evaluate Exertional Dyspnea. Only able to achieve 80% of peak heart rate; peak VO2 was 75% - but possibly underestimated.  . Hyperlipidemia with target LDL less than 70   . Essential hypertension   . OSA on CPAP   . Erectile dysfunction due to arterial insufficiency   . Sciatic pain     Chronic  . GERD (gastroesophageal reflux disease)   . PAF (paroxysmal atrial fibrillation) [facility]) February 2016    Anticoagulated with Eliquis. On combination beta blocker and calcium channel blocker  . Colon polyps   . TIA (transient ischemic attack)   . Insomnia   . Memory loss   . Lichen planus   . Allergy   . COPD (chronic obstructive pulmonary disease) ([place])   . Osteoporosis   . Cancer [facility])     Skin cancer    Patient Active Problem List   Diagnosis Date Noted  . Disorder of oral mucous membrane 02/10/2015  . CN (constipation) 02/10/2015  . DD (diverticular disease) 02/10/2015  . Accumulation of fluid in tissues 02/10/2015  . Elevated WBC count 02/10/2015  . Fatigue 02/10/2015  . Hemorrhoid 02/10/2015  . Hypoxia 02/10/2015  . Cannot sleep 02/10/2015  . Lichen planus 75/17/0017  . Restless leg 02/10/2015  . 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 Temporary cerebral vascular dysfunction 02/10/2015  . B12 deficiency 02/10/2015  . SOB (shortness of breath) 12/14/2014  . Lower extremity edema 12/14/2014  .  COPD (chronic obstructive pulmonary disease) ([place]) 11/14/2014  . ILD (interstitial lung disease) ([place]) 11/14/2014  . Confusion state 11/08/2014  . Amnesia 11/08/2014  . PAF (paroxysmal atrial fibrillation) ([place])   . Hypotension 12/09/2013  . Insomnia 12/09/2013  . OSA on CPAP   . Erectile dysfunction   . Dyspnea on exertion - -essentially resolved with BB dose reduction & wgt loss 03/29/2013  . Overweight (BMI 25.0-29.9) -- Wgt back up 01/17/2013  . Atherosclerotic heart  disease of native coronary artery without angina pectoris - s/p CABG, occluded SVG-D1; patent LIMA-LAD, SVG-OM, SVG- RCA   . Essential hypertension   . Hyperlipidemia with target LDL less than 70   . Aorto-iliac disease ([place])   . Basal cell carcinoma of eyelid 01/03/2013  . Carotid artery stenosis 09/09/2012  . ED (erectile dysfunction) of organic origin 11/19/2009  . Allergic rhinitis 08/17/2009  . Arthralgia of ankle or foot 06/04/2009  . Adaptation reaction 05/28/2009  . Arterial vascular disease 05/28/2009  . Benign prostatic hypertrophy without urinary obstruction 05/28/2009  . Acid reflux 05/28/2009  . Arthritis, degenerative 05/28/2009  . Need for prophylactic hormone replacement therapy (postmenopausal) 05/28/2009  . Current tobacco use 05/28/2009  . PAD (peripheral artery disease) - bilateral common iliac stents 12/15/2005    Past Surgical History  Procedure Laterality Date  . Coronary artery bypass graft  1998    LIMA-LAD, SVG-OM, SVG-RPDA, SVG-DIAG  . Cardiac catheterization  01/30/1998      total native LAD andRCA ,mod native CIRC; svg to RCA and OBTUSE MARG. PATENT the left internal mammary artery graft to LAD ; LV NORMAL  /  . Doppler echocardiography  11/20/2010    EF =>55%; LV norm mild aortic scelorosis  . Nm myocar perf wall motion  11/11/2010; February 2016    a) TM STRESS----NORMAL PERFUSION ,EF 68%; b) EF 50-55%. Normal LV function. No significant ischemia or infarction.  [name] Cpet / met  12/2012    Mild chronotropic incompetence - read 82% of predicted; also reduced effort; peak VO2 15.7 / 75% (did not reach Max effort) -- suggested ischemic response in last 1.5 minutes of exercise. Normal pulmonary function on PFTs but poor response to  . Arch aortogram and carotid aortogram  06/02/2006    Dr Oneida Alar did surgery  . Iliac artery stent  12/15/2005    PTA and direct stenting rgt and lft common iliac arteries  . Thoracic aorta - carotid angiogram  October 2007    Dr.  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 Carotid endarterectomy Left Oct. 29, 2007    Dr. Oneida Alar:  . Ankle surgery    . Hiatal hernia repair    . Transthoracic echocardiogram  February 2016    [facility]: Normal LV function. Dilated left atrium.  . Orif wrist fracture Right 03/19/2015    Procedure: OPEN REDUCTION INTERNAL FIXATION (ORIF) WRIST FRACTURE;  Surgeon: Hessie Knows, MD;  Location: [facility] ORS;  Service: Orthopedics;  Laterality: Right;  . Fracture surgery Right 03/19/2015    wrist    Current Outpatient Rx  Name  Route  Sig  Dispense  Refill  . albuterol (PROVENTIL HFA;VENTOLIN HFA) 108 (90 BASE) MCG/ACT inhaler   Inhalation   Inhale 2 puffs into the lungs every 4 (four) hours as needed for wheezing or shortness of breath.   1 Inhaler   4   . ALPRAZolam (XANAX) 0.5 MG tablet   Oral  Take 1 tablet (0.5 mg total) by mouth at bedtime as needed for anxiety.   90 tablet   1   . apixaban (ELIQUIS) 2.5 MG TABS tablet   Oral   Take 2.5 mg by mouth daily at 6 PM.         . Ascorbic Acid (VITAMIN C) 1000 MG tablet   Oral   Take 1,000 mg by mouth daily.         . Biotin 1000 MCG tablet   Oral   Take 1,000 mcg by mouth daily.         [name] DEXILANT 60 MG capsule      TAKE ONE CAPSULE BY MOUTH EVERY DAY   30 capsule   11   . ELIQUIS 5 MG TABS tablet      TAKE ONE TABLET TWICE DAILY Patient taking differently: TAKE ONE TABLET By Mouth Daily in the morning   60 tablet   8     FOR FUTURE FILL THANKS   . finasteride (PROSCAR) 5 MG tablet   Oral   Take 5 mg by mouth daily.         . Fluticasone-Salmeterol (ADVAIR) 250-50 MCG/DOSE AEPB   Inhalation   Inhale 1 puff into the lungs 2 (two) times daily.   60 each   5   . lisinopril (PRINIVIL,ZESTRIL) 2.5 MG tablet   Oral   Take 1 tablet (2.5 mg total) by mouth 2 (two) times daily.   180 tablet   6   . magnesium oxide (MAG-OX)  400 MG tablet   Oral   Take 400 mg by mouth daily.         . metoprolol tartrate (LOPRESSOR) 25 MG tablet      TAKE ONE TABLET TWICE DAILY   60 tablet   5   . Multiple Vitamins-Minerals (PRESERVISION AREDS) TABS   Oral   Take 1 tablet by mouth 2 (two) times daily.         . nitroGLYCERIN (NITROSTAT) 0.4 MG SL tablet   Sublingual   Place 1 tablet (0.4 mg total) under the tongue every 5 (five) minutes as needed for chest pain.   25 tablet   3   . rosuvastatin (CRESTOR) 20 MG tablet   Oral   Take 1 tablet (20 mg total) by mouth daily.   90 tablet   2   . tamsulosin (FLOMAX) 0.4 MG CAPS capsule   Oral   Take 0.4 mg by mouth daily after supper.         [name] ZETIA 10 MG tablet      TAKE ONE TABLET EVERY DAY   90 tablet   1     Dispense as written.    PROFILE FOR NEXT MONTH   . furosemide (LASIX) 20 MG tablet   Oral   Take 1 tablet (20 mg total) by mouth as needed (as needed for swelling). Patient not taking: Reported on 10/18/2015   30 tablet   3   . traMADol (ULTRAM) 50 MG tablet   Oral   Take 1 tablet (50 mg total) by mouth every 12 (twelve) hours as needed for moderate pain.   12 tablet   0     Allergies Clonazepam; Codeine; and Niacin and related  Family History  Problem Relation Age of Onset  . Ulcers Mother     Peptic  . Dementia Mother   . Alcohol abuse Father     Social History Social History  Substance  Use Topics  . Smoking status: Former Smoker    Types: Cigarettes    Quit date: 08/04/1997  . Smokeless tobacco: Never Used  . Alcohol Use: No    Review of Systems Constitutional: No fever/chills Eyes: No visual changes. ENT: No sore throat. Cardiovascular: Denies chest pain. Respiratory: Denies shortness of breath. Gastrointestinal: No abdominal pain.  No nausea, no vomiting.  No diarrhea.  No constipation. Genitourinary: Negative for dysuria. Musculoskeletal: Right shoulder pain . Skin: Negative for rash. Neurological: Negative  for headaches, focal weakness or numbness. Endocrine:Hypertension and hyperlipidemia Hematological/Lymphatic: Allergic/Immunilogical: See medication list  10-point ROS otherwise negative.  ____________________________________________   PHYSICAL EXAM:  VITAL SIGNS: ED Triage Vitals  Enc Vitals Group     BP 11/30/15 0658 132/79 mmHg     Pulse Rate 11/30/15 0658 84     Resp 11/30/15 0658 18     Temp 11/30/15 0658 97.9 F (36.6 C)     Temp Source 11/30/15 0658 Oral     SpO2 11/30/15 0658 96 %     Weight 11/30/15 0658 225 lb (102.059 kg)     Height 11/30/15 0658 6' (1.829 m)     Head Cir --      Peak Flow --      Pain Score 11/30/15 0658 2     Pain Loc --      Pain Edu? --      Excl. in [place]? --     Constitutional: Alert and oriented. Well appearing and in no acute distress. Eyes: Conjunctivae are normal. PERRL. EOMI. Head: Atraumatic. Nose: No congestion/rhinnorhea. Mouth/Throat: Mucous membranes are moist.  Oropharynx non-erythematous. Neck: No stridor.  No cervical spine tenderness to palpation. Hematological/Lymphatic/Immunilogical: No cervical lymphadenopathy. Cardiovascular: Normal rate, regular rhythm. Grossly normal heart sounds.  Good peripheral circulation. Respiratory: Normal respiratory effort.  No retractions. Lungs CTAB. Gastrointestinal: Soft and nontender. No distention. No abdominal bruits. No CVA tenderness. Musculoskeletal: Obvious deformity of the right upper extremity. There is some mild edema at the humeral head. Patient is moderate guarding palpation of the humeral head. Patient decreased range of motion limited by complaining of pain with abduction.  Neurologic:  Normal speech and language. No gross focal neurologic deficits are appreciated. No gait instability. Skin:  Skin is warm, dry and intact. No rash noted. Psychiatric: Mood and affect are normal. Speech and behavior are normal.  ____________________________________________   LABS (all labs  ordered are listed, but only abnormal results are displayed)  Labs Reviewed - No data to display ____________________________________________  EKG   ____________________________________________  RADIOLOGY  X-ray shows nondisplaced fractures to the greater tuberosity the proximal right humerus. I, Sable Feil, personally viewed and evaluated these images (plain radiographs) as part of my medical decision making, as well as reviewing the written report by the radiologist.  ____________________________________________   PROCEDURES  Procedure(s) performed: None  Critical Care performed: No  ____________________________________________   INITIAL IMPRESSION / ASSESSMENT AND PLAN / ED COURSE  Pertinent labs & imaging results that were available during my care of the patient were reviewed by me and considered in my medical decision making (see chart for details).  Nondisplaced right humeral head fracture. Discussed findings with patient. Patient placed in an arm sling and advised to follow-up with orthopedics for further evaluation and treatment. Patient given prescription for tramadol. ____________________________________________   FINAL CLINICAL IMPRESSION(S) / ED DIAGNOSES  Final diagnoses:  Shoulder fracture, right, closed, initial encounter      Sable Feil, PA-C 11/30/15 0800  Lavonia Drafts, MD 11/30/15 [phone]

## 2015-11-30 NOTE — ED Notes (Signed)
Patient presents with c/o RIGHT shoulder pain s/p fall. Patient advising that he got "twisted up in his blanket" when he got OOB - sustained fall and hit small set of steps that are at his bedside. Patient states, "I felt it break". (+) PMS distal to injury. Presents in make shift sling.

## 2015-11-30 NOTE — ED Notes (Signed)
Patient transported to X-ray 

## 2015-12-04 ENCOUNTER — Ambulatory Visit: Payer: Medicare Other

## 2015-12-04 DIAGNOSIS — S42254A Nondisplaced fracture of greater tuberosity of right humerus, initial encounter for closed fracture: Secondary | ICD-10-CM | POA: Diagnosis not present

## 2015-12-04 DIAGNOSIS — M25511 Pain in right shoulder: Secondary | ICD-10-CM | POA: Diagnosis not present

## 2015-12-05 ENCOUNTER — Ambulatory Visit
Admission: RE | Admit: 2015-12-05 | Discharge: 2015-12-05 | Disposition: A | Discharge status: Home / Self Care | Payer: Medicare Other | Source: Ambulatory Visit | Attending: Internal Medicine | Admitting: Internal Medicine

## 2015-12-05 DIAGNOSIS — K8020 Calculus of gallbladder without cholecystitis without obstruction: Secondary | ICD-10-CM | POA: Diagnosis not present

## 2015-12-05 DIAGNOSIS — R918 Other nonspecific abnormal finding of lung field: Secondary | ICD-10-CM | POA: Diagnosis not present

## 2015-12-05 DIAGNOSIS — J849 Interstitial pulmonary disease, unspecified: Secondary | ICD-10-CM | POA: Diagnosis present

## 2015-12-05 DIAGNOSIS — R0602 Shortness of breath: Secondary | ICD-10-CM | POA: Diagnosis not present

## 2015-12-06 ENCOUNTER — Other Ambulatory Visit: Payer: Self-pay | Admitting: Family Medicine

## 2015-12-06 ENCOUNTER — Telehealth: Payer: Self-pay | Admitting: *Deleted

## 2015-12-06 DIAGNOSIS — E785 Hyperlipidemia, unspecified: Secondary | ICD-10-CM

## 2015-12-06 NOTE — Telephone Encounter (Signed)
Pt informed of CT Chest results. Nothing further needed.

## 2015-12-06 NOTE — Telephone Encounter (Signed)
-----   Message from Vilinda Boehringer, MD sent at 12/06/2015  3:50 AM EDT ----- Regarding: CT results Please inform patient that is Chest Chest is appropriate, there are no tumors or masses. There is some mild scarring at the bases, which is old. Otherwise, good appearing CT with no significant abnormalities.   Thanks VM

## 2015-12-11 ENCOUNTER — Ambulatory Visit (INDEPENDENT_AMBULATORY_CARE_PROVIDER_SITE_OTHER): Payer: Medicare Other | Admitting: Internal Medicine

## 2015-12-11 ENCOUNTER — Encounter: Payer: Self-pay | Admitting: Internal Medicine

## 2015-12-11 VITALS — BP 148/90 | HR 86 | Ht 72.0 in | Wt 228.0 lb

## 2015-12-11 DIAGNOSIS — G4733 Obstructive sleep apnea (adult) (pediatric): Secondary | ICD-10-CM | POA: Diagnosis not present

## 2015-12-11 DIAGNOSIS — J849 Interstitial pulmonary disease, unspecified: Secondary | ICD-10-CM | POA: Diagnosis not present

## 2015-12-11 DIAGNOSIS — I779 Disorder of arteries and arterioles, unspecified: Secondary | ICD-10-CM

## 2015-12-11 DIAGNOSIS — Z9989 Dependence on other enabling machines and devices: Principal | ICD-10-CM

## 2015-12-11 DIAGNOSIS — J432 Centrilobular emphysema: Secondary | ICD-10-CM

## 2015-12-11 NOTE — Progress Notes (Signed)
[facility] Consultation      MRN# [id number] [name] March 25, 1934   CC: Chief Complaint  Patient presents with  . Follow-up    CT results: breathing doing well; using CPAP; no concerns      Brief History: HPI 11/14/14 Patient is a pleasant 82 year old male with a past medical history of hypertension, hyperlipidemia, obstructive sleep apnea on CPAP(not using), coronary artery disease who is seen today for hospital followup of acute respiratory failure. Detailed history as outlined hospital summarization below. Briefly, one week prior to admission patient started having gradual onset of shortness of breath, and confusion, he was seen at the emergency room initially for difficulty with urination, and was discharged from the emergency room at that time. The week progresses confusion shortness of breath continued and he represented to the emergency room chest x-ray showed probable pneumonia at that time. Since discharge patient states that he's been doing well his breathing is back to baseline, and he is back to attending the gym. Of note 1 month prior to admission he was in his usual state of health he attends a gym daily, he has a girlfriend that accompanies him. Further history reveals that he had upper respiratory tract infection urine December, was diagnosed with bronchitis, this cleared, one week prior to hospitalization he started having wheezing initially was thought to have a UTI, but as stated above this was negative, and confusion ensued along with hypoxia leading to admission to the hospital for approximately one week.  Today patient states that he is doing well he did not endorse any worsening shortness of breath, he actually states his shortness of breath is improving, he does have a history of obstructive sleep apnea and was probably on CPAP but has not been wearing CPAP. He was diagnosed with OSA many years ago at sleep med. He denies any drug use,  previously had dogs at home, previously employed in [employer] work, he does have a significant tobacco history previously smoked one to 2 packs per day for 32 years and quit a number of years ago.  Upon discharge from the hospital he was discharged on Advair, 3 L of oxygen continuously even at night and advised to followup to determine his outpatient oxygen needs. During his visit today he had a 6 minute walk test that showed lowest saturation on room air to 89%, he walked 936 feet, his highest heart rate was 108.  Patient also stated that he is currently on BiPAP, but his hospital discharge states CPAP. Plan: PFTs and HRCT, continue with inhalers   ROV 12/06/14: Patient presents today for a follow up visit.  Wearing cpap machine every night. No worsening shortness of breath. Had a repeat titration study done. Started back exercising.   Plan: CPAP 12 cm of water, 7 nights per week, minimum 6 hours per night. Previously on BiPAP, now switched over based on new split study back to CPAP. Cont with 2L at night with CPAP Cont with COPD regiment.   ROV 03/2015: Patient presents today for follow-up visit, he is accompanied by his girlfriend. Patient states overall he is doing well, exercising daily, eating healthy. He uses CPAP nightly about 5-6 hours per night. Patient stated that he recently went on vacation and for about 2-3 nights he did not have supplemental oxygen with CPAP, but checked his O2 saturations in the morning which are consistently in the mid 90s. Patient had a repeat high-resolution CT scan of the chest performed about a week  ago like to discuss the results of it.  Plan: -Continue Advair -Continue with tobacco cessation -Continue with diet and exercise -continue using NIVPPV (non-invasive positive pressure ventilation, CPAP) for OSA. -O2 at night, 2L , will repeat overnight pulse oximetry study, based on results of studies will decide if to stop supplemental oxygen at  night -Continue with COPD optimization, continue with OSA optimization. -High-resolution CT chest without contrast in one year to assess temporal changes  Events since last clinic visit: Patient presents today for a follow up visit of OSA/COPD. He states overall he is doing well from a respiratory standpoint, he has not had to use his albuterol rescue inhaler since his last visit. He did have an accidental fall where he fractured his right shoulder, and is currently in an immobilizer; this occurred 2 weeks ago. At his last visit he had his follow-up high-resolution CAT scan for suspected either the based on previous radiologic studies. Today he denies any weight loss, night sweats, significant shortness of breath, dyspnea on exertion. He is also accompanied by his friend. Patient states since his fall he did not wear his CPAP for about 10 days, but started wearing it about 3-4 days ago nightly. Patient states that his primary care physician was previously managing his OSA/CPAP, however she is moving in 2 months, and he requested that we take over his OSA/CPAP care, he is eligible for a new machine in October 2017, his DME is advanced.    Medication:   Current Outpatient Rx  Name  Route  Sig  Dispense  Refill  . albuterol (PROVENTIL HFA;VENTOLIN HFA) 108 (90 BASE) MCG/ACT inhaler   Inhalation   Inhale 2 puffs into the lungs every 4 (four) hours as needed for wheezing or shortness of breath.   1 Inhaler   4   . ALPRAZolam (XANAX) 0.5 MG tablet   Oral   Take 1 tablet (0.5 mg total) by mouth at bedtime as needed for anxiety.   90 tablet   1   . apixaban (ELIQUIS) 2.5 MG TABS tablet   Oral   Take 2.5 mg by mouth 2 (two) times daily.          . Ascorbic Acid (VITAMIN C) 1000 MG tablet   Oral   Take 1,000 mg by mouth daily.         . Biotin 1000 MCG tablet   Oral   Take 1,000 mcg by mouth daily.         [name] DEXILANT 60 MG capsule      TAKE ONE CAPSULE BY MOUTH EVERY DAY    30 capsule   11   . finasteride (PROSCAR) 5 MG tablet   Oral   Take 5 mg by mouth daily.         . Fluticasone-Salmeterol (ADVAIR) 250-50 MCG/DOSE AEPB   Inhalation   Inhale 1 puff into the lungs 2 (two) times daily.   60 each   5   . furosemide (LASIX) 20 MG tablet   Oral   Take 1 tablet (20 mg total) by mouth as needed (as needed for swelling).   30 tablet   3   . lisinopril (PRINIVIL,ZESTRIL) 2.5 MG tablet   Oral   Take 1 tablet (2.5 mg total) by mouth 2 (two) times daily.   180 tablet   6   . magnesium oxide (MAG-OX) 400 MG tablet   Oral   Take 400 mg by mouth daily.         [name]  metoprolol tartrate (LOPRESSOR) 25 MG tablet      TAKE ONE TABLET TWICE DAILY   60 tablet   5   . Multiple Vitamins-Minerals (PRESERVISION AREDS) TABS   Oral   Take 1 tablet by mouth 2 (two) times daily.         . nitroGLYCERIN (NITROSTAT) 0.4 MG SL tablet   Sublingual   Place 1 tablet (0.4 mg total) under the tongue every 5 (five) minutes as needed for chest pain.   25 tablet   3   . rosuvastatin (CRESTOR) 20 MG tablet      TAKE ONE TABLET EVERY DAY   90 tablet   1   . ZETIA 10 MG tablet      TAKE ONE TABLET EVERY DAY   90 tablet   1     Dispense as written.    PROFILE FOR NEXT MONTH      Review of Systems  Constitutional: Negative for fever, chills and weight loss.  HENT: Negative for hearing loss.   Eyes: Negative for blurred vision and double vision.  Respiratory: Negative for cough, hemoptysis, sputum production, shortness of breath and wheezing.   Cardiovascular: Negative for chest pain and palpitations.  Gastrointestinal: Negative for heartburn.  Musculoskeletal: Negative for myalgias.  Neurological: Negative for headaches.      Allergies:  Clonazepam; Codeine; and Niacin and related  Physical Examination:  VS: BP 148/90 mmHg  Pulse 86  Ht 6' (1.829 m)  Wt 228 lb (103.42 kg)  BMI 30.92 kg/m2  SpO2 95%  General Appearance: No distress  HEENT:  PERRLA, no ptosis, no other lesions noticed Pulmonary:normal breath sounds., Mild dry crackles at the bases bilaterally, diaphragmatic excursion normal.No wheezing, No rales   Cardiovascular:  Normal S1,S2.  No m/r/g.     Abdomen:Exam: Benign, Soft, non-tender, No masses  Skin:   warm, no rashes, no ecchymosis  Extremities: normal, no cyanosis, clubbing, warm with normal capillary refill.     Radiology (The following images and results were reviewed by Dr. Stevenson Clinch on 12/11/2015). CT CHEST WITHOUT CONTRAST 12/05/15  TECHNIQUE: Multidetector CT imaging of the chest was performed following the standard protocol without intravenous contrast. High resolution imaging of the lungs, as well as inspiratory and expiratory imaging, was performed.  COMPARISON: 02/21/2015.  FINDINGS: Mediastinum/Lymph Nodes: No pathologically enlarged mediastinal or axillary lymph nodes. Calcified mediastinal and right hilar lymph nodes are noted. Heart size normal. No pericardial effusion. Small hiatal hernia.  Lungs/Pleura: Calcified granuloma in the right upper lobe. Moderate centrilobular emphysema. Mild subpleural ground-glass and coarsening in the dependent portions of both lower lobes with parenchymal ground-glass and bronchiectasis, unchanged from 02/21/2015. Minimal air trapping. No pleural fluid. Airway is unremarkable.  Upper abdomen: Liver is unremarkable. Stones are seen in the gallbladder. Visualized portions of the adrenal glands and right kidney are unremarkable. Small left renal stone versus vascular calcification. Visualized portions of the spleen and pancreas are unremarkable. Small hiatal hernia.  Musculoskeletal: No worrisome lytic or sclerotic lesions. Degenerative changes are seen in the spine.  IMPRESSION: 1. Probable bilateral lower lobe pulmonary parenchymal scarring, possibly post infectious or post inflammatory in etiology. No definitive evidence of interstitial lung  disease. 2. Cholelithiasis. 3. Small left renal stone versus vascular calcification.   Assessment and Plan: 82 year old male presented today for follow-up visit of OSA, COPD, mild ILD. COPD (chronic obstructive pulmonary disease) Patient of prolonged history of smoking in the past however quit a number of years ago, currently on Advair 250/50.  Plan: -Continue Advair -Continue with tobacco cessation -Continue with diet and exercise -continue using NIVPPV (non-invasive positive pressure ventilation, CPAP) for OSA.           OSA on CPAP OSA  Discussed sleep data and reviewed with patient.  Encouraged proper weight management.  Excessive weight may contribute to snoring.  Monitor sedative use.  Discussed driving precautions and its relationship with hypersomnolence.  Discussed operating dangerous equipment and its relationship with hypersomnolence.  Discussed sleep hygiene, and benefits of a fixed sleep waked time.  The importance of getting eight or more hours of sleep discussed with patient.  Discussed limiting the use of the computer and television before bedtime.  Decrease naps during the day, so night time sleep will become enhanced.  Limit caffeine, and sleep deprivation.  HTN, stroke, and heart failure are potential risk factors.     Plan: CPAP 12 cm of water, 7 nights per week, minimum 6 hours per night. Does not require supplemental O2 with CPAP anymore. Patient states that his primary care physician was previously managing his OSA/CPAP, however she is moving in 2 months, and he requested that we take over his OSA/CPAP care, he is eligible for a new machine in October 2017, his DME is advanced. [facility] will take over the care of his OSA on CPAP, at the request of the patient.                Post Inflammatory Lung Changes Patient with previous hospitalization, CT scan  this hospitalization showed chronic interstitial processes and peripheral  groundglass opacities. 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 He does have a history of COPD and is currently on Advair to 250/50. Repeat high-resolution CT scan with mild centrilobular and paraseptal emphysema. There was some scattered mild subpleural reticulation, which were asymmetrically distributed predominantly in the lung bases, particularly at on the right side. These were mildly increased from CT scan on 10/28/2014, no other widespread areas of groundglass attenuation noted. Overall there are some mild fibrotic changes in the lung bases which are predominantly in the dependent portion of the right lower lobe and are favored to be areas of post infectious/inflammatory fibrosis. HRCT repeat on 12/05/15, showed similar findings, with no significant increases in GGO or fibrosis.  Overall, does not have an ILD (based on imaging), just post infectious/inflammatory changes of prolonged emphysema and prior infections.    Plan: -Continue with COPD optimization, continue with OSA optimization.     Updated Medication List Outpatient Encounter Prescriptions as of 12/11/2015  Medication Sig  . albuterol (PROVENTIL HFA;VENTOLIN HFA) 108 (90 BASE) MCG/ACT inhaler Inhale 2 puffs into the lungs every 4 (four) hours as needed for wheezing or shortness of breath.  . ALPRAZolam (XANAX) 0.5 MG tablet Take 1 tablet (0.5 mg total) by mouth at bedtime as needed for anxiety.  [name] apixaban (ELIQUIS) 2.5 MG TABS tablet Take 2.5 mg by mouth 2 (two) times daily.   . Ascorbic Acid (VITAMIN C) 1000 MG tablet Take 1,000 mg by mouth daily.  . Biotin 1000 MCG tablet Take 1,000 mcg by mouth daily.  [name] DEXILANT 60 MG capsule TAKE ONE CAPSULE BY MOUTH EVERY DAY  . finasteride (PROSCAR) 5 MG tablet Take 5 mg by mouth daily.  . Fluticasone-Salmeterol (ADVAIR) 250-50 MCG/DOSE AEPB Inhale 1 puff into the lungs 2 (two) times daily.  . furosemide (LASIX) 20 MG tablet Take 1 tablet  (20 mg total) by mouth as needed (as needed for  swelling).  [name] lisinopril (PRINIVIL,ZESTRIL) 2.5 MG tablet Take 1 tablet (2.5 mg total) by mouth 2 (two) times daily.  . magnesium oxide (MAG-OX) 400 MG tablet Take 400 mg by mouth daily.  . metoprolol tartrate (LOPRESSOR) 25 MG tablet TAKE ONE TABLET TWICE DAILY  . Multiple Vitamins-Minerals (PRESERVISION AREDS) TABS Take 1 tablet by mouth 2 (two) times daily.  . nitroGLYCERIN (NITROSTAT) 0.4 MG SL tablet Place 1 tablet (0.4 mg total) under the tongue every 5 (five) minutes as needed for chest pain.  . rosuvastatin (CRESTOR) 20 MG tablet TAKE ONE TABLET EVERY DAY  . ZETIA 10 MG tablet TAKE ONE TABLET EVERY DAY  . [DISCONTINUED] ELIQUIS 5 MG TABS tablet TAKE ONE TABLET TWICE DAILY (Patient taking differently: TAKE ONE TABLET By Mouth Daily in the morning)  . [DISCONTINUED] tamsulosin (FLOMAX) 0.4 MG CAPS capsule Take 0.4 mg by mouth daily after supper.  . [DISCONTINUED] traMADol (ULTRAM) 50 MG tablet Take 1 tablet (50 mg total) by mouth every 12 (twelve) hours as needed for moderate pain.   No facility-administered encounter medications on file as of 12/11/2015.    Orders for this visit: No orders of the defined types were placed in this encounter.    Thank  you for the visitation and for allowing  [facility] to assist in the care of your patient. Our recommendations are noted above.  Please contact us if we can be of further service.  Vilinda Boehringer, MD [facility] Office Number: [phone]

## 2015-12-11 NOTE — Assessment & Plan Note (Addendum)
OSA  Discussed sleep data and reviewed with patient.  Encouraged proper weight management.  Excessive weight may contribute to snoring.  Monitor sedative use.  Discussed driving precautions and its relationship with hypersomnolence.  Discussed operating dangerous equipment and its relationship with hypersomnolence.  Discussed sleep hygiene, and benefits of a fixed sleep waked time.  The importance of getting eight or more hours of sleep discussed with patient.  Discussed limiting the use of the computer and television before bedtime.  Decrease naps during the day, so night time sleep will become enhanced.  Limit caffeine, and sleep deprivation.  HTN, stroke, and heart failure are potential risk factors.     Plan: CPAP 12 cm of water, 7 nights per week, minimum 6 hours per night. Does not require supplemental O2 with CPAP anymore. Patient states that his primary care physician was previously managing his OSA/CPAP, however she is moving in 2 months, and he requested that we take over his OSA/CPAP care, he is eligible for a new machine in October 2017, his DME is advanced. [facility] will take over the care of his OSA on CPAP, at the request of the patient.

## 2015-12-11 NOTE — Patient Instructions (Signed)
Follow up with Dr. Stevenson Clinch in: 6 months - cont with current inhalers - cont with CPAP nightly - diet and exercise - avoid all forms of tobacco and noxious substances

## 2015-12-11 NOTE — Assessment & Plan Note (Signed)
Patient of prolonged history of smoking in the past however quit a number of years ago, currently on Advair 250/50.  Plan: -Continue Advair -Continue with tobacco cessation -Continue with diet and exercise -continue using NIVPPV (non-invasive positive pressure ventilation, CPAP) for OSA.        

## 2015-12-11 NOTE — Assessment & Plan Note (Addendum)
Patient with previous hospitalization, CT scan  this hospitalization showed chronic interstitial processes and peripheral groundglass opacities. 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 He does have a history of COPD and is currently on Advair to 250/50. Repeat high-resolution CT scan with mild centrilobular and paraseptal emphysema. There was some scattered mild subpleural reticulation, which were asymmetrically distributed predominantly in the lung bases, particularly at on the right side. These were mildly increased from CT scan on 10/28/2014, no other widespread areas of groundglass attenuation noted. Overall there are some mild fibrotic changes in the lung bases which are predominantly in the dependent portion of the right lower lobe and are favored to be areas of post infectious/inflammatory fibrosis. HRCT repeat on 12/05/15, showed similar findings, with no significant increases in GGO or fibrosis.  Overall, does not have an ILD (based on imaging), just post infectious/inflammatory changes of prolonged emphysema and prior infections.    Plan: -Continue with COPD optimization, continue with OSA optimization.

## 2015-12-12 ENCOUNTER — Encounter: Payer: Self-pay | Admitting: Emergency Medicine

## 2015-12-12 ENCOUNTER — Emergency Department: Payer: Medicare Other

## 2015-12-12 ENCOUNTER — Emergency Department
Admission: EM | Admit: 2015-12-12 | Discharge: 2015-12-12 | Disposition: A | Discharge status: Home / Self Care | Payer: Medicare Other | Attending: Emergency Medicine | Admitting: Emergency Medicine

## 2015-12-12 DIAGNOSIS — Z7951 Long term (current) use of inhaled steroids: Secondary | ICD-10-CM | POA: Insufficient documentation

## 2015-12-12 DIAGNOSIS — C44111 Basal cell carcinoma of skin of unspecified eyelid, including canthus: Secondary | ICD-10-CM | POA: Diagnosis not present

## 2015-12-12 DIAGNOSIS — I2510 Atherosclerotic heart disease of native coronary artery without angina pectoris: Secondary | ICD-10-CM | POA: Insufficient documentation

## 2015-12-12 DIAGNOSIS — M810 Age-related osteoporosis without current pathological fracture: Secondary | ICD-10-CM | POA: Diagnosis not present

## 2015-12-12 DIAGNOSIS — Z79899 Other long term (current) drug therapy: Secondary | ICD-10-CM | POA: Insufficient documentation

## 2015-12-12 DIAGNOSIS — S0990XA Unspecified injury of head, initial encounter: Secondary | ICD-10-CM | POA: Insufficient documentation

## 2015-12-12 DIAGNOSIS — T07 Unspecified multiple injuries: Secondary | ICD-10-CM | POA: Insufficient documentation

## 2015-12-12 DIAGNOSIS — Y999 Unspecified external cause status: Secondary | ICD-10-CM | POA: Diagnosis not present

## 2015-12-12 DIAGNOSIS — I480 Paroxysmal atrial fibrillation: Secondary | ICD-10-CM | POA: Insufficient documentation

## 2015-12-12 DIAGNOSIS — S51012A Laceration without foreign body of left elbow, initial encounter: Secondary | ICD-10-CM | POA: Diagnosis not present

## 2015-12-12 DIAGNOSIS — Z85828 Personal history of other malignant neoplasm of skin: Secondary | ICD-10-CM | POA: Insufficient documentation

## 2015-12-12 DIAGNOSIS — I6529 Occlusion and stenosis of unspecified carotid artery: Secondary | ICD-10-CM | POA: Diagnosis not present

## 2015-12-12 DIAGNOSIS — I119 Hypertensive heart disease without heart failure: Secondary | ICD-10-CM | POA: Insufficient documentation

## 2015-12-12 DIAGNOSIS — Z8673 Personal history of transient ischemic attack (TIA), and cerebral infarction without residual deficits: Secondary | ICD-10-CM | POA: Diagnosis not present

## 2015-12-12 DIAGNOSIS — Y9289 Other specified places as the place of occurrence of the external cause: Secondary | ICD-10-CM | POA: Diagnosis not present

## 2015-12-12 DIAGNOSIS — E785 Hyperlipidemia, unspecified: Secondary | ICD-10-CM | POA: Diagnosis not present

## 2015-12-12 DIAGNOSIS — N400 Enlarged prostate without lower urinary tract symptoms: Secondary | ICD-10-CM | POA: Diagnosis not present

## 2015-12-12 DIAGNOSIS — Y939 Activity, unspecified: Secondary | ICD-10-CM | POA: Diagnosis not present

## 2015-12-12 DIAGNOSIS — Z87891 Personal history of nicotine dependence: Secondary | ICD-10-CM | POA: Insufficient documentation

## 2015-12-12 DIAGNOSIS — J449 Chronic obstructive pulmonary disease, unspecified: Secondary | ICD-10-CM | POA: Insufficient documentation

## 2015-12-12 DIAGNOSIS — S51811A Laceration without foreign body of right forearm, initial encounter: Secondary | ICD-10-CM | POA: Diagnosis not present

## 2015-12-12 DIAGNOSIS — T148XXA Other injury of unspecified body region, initial encounter: Secondary | ICD-10-CM

## 2015-12-12 DIAGNOSIS — Z951 Presence of aortocoronary bypass graft: Secondary | ICD-10-CM | POA: Diagnosis not present

## 2015-12-12 DIAGNOSIS — W108XXA Fall (on) (from) other stairs and steps, initial encounter: Secondary | ICD-10-CM | POA: Insufficient documentation

## 2015-12-12 NOTE — ED Provider Notes (Signed)
Time Seen: Approximately 1530  I have reviewed the triage notes  Chief Complaint: Fall   History of Present Illness: [name] is a 82 y.o. male *who presents with an status post fall. Patient apparently was coming out of his house and tripped over a package that was at the top of the steps and fell down approximately 4 steps with some right-sided head trauma and multiple skin tears are worse when being on his right forearm area. He denies any underlying discomfort in his head. Range of motion and ambulation without difficulty. He denies any loss of consciousness, neck pain, thoracic or lumbar spine pain.   Past Medical History  Diagnosis Date  . Atherosclerotic heart disease of native coronary artery without angina pectoris 1998    Multivessel disease on cath for angina: S/p CABG x 4 (LIMA-LAD, SVG-RCA, SVG-OM, SVG-DIAG); SVG-Diag occluded on cath 1999  . Atherosclerotic heart disease of artery bypass graft 1999    a) Cardiac Cath : Occluded SVG-D1;; ostial LM 80-90%, diffuse LAD dz w/ D1 &2 subtotalled, LCx 70% prox with competitive flow from SVG-OM, RCA 100% occluded; patent LIMA-LAD, LC-RCA and SVG-OM; b) TM Myoview 10/2010: Ex 6.5 min - 9 METS, HR to 149, No Ischemia or infarct, PVCs; c) Echo 10/2010: EF >55%, mild AoSclerosis, Conc Remodelling.; Myoview 2/ 2016: EF 55%. Low   risk. No ischemia.  . Carotid artery occlusion without infarction 06/2006    a) High Grade L Carotid Stenosis on Doppler -> s/x L CEA; b) Carotid Doppler 10/2013:: Left CEA patent, R. ICA 60-70% stenosis  . 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  . Aortoiliac occlusive disease [facility]) April 2007    Status post bilateral common iliac artery stenting  . Ischemic heart disease with chronotropic incompetence     Seen on CPET to  evaluate Exertional Dyspnea. Only able to achieve 80% of peak heart rate; peak VO2 was 75% - but possibly underestimated.  . Hyperlipidemia with target LDL less than 70   . Essential hypertension   . OSA on CPAP   . Erectile dysfunction due to arterial insufficiency   . Sciatic pain     Chronic  . GERD (gastroesophageal reflux disease)   . PAF (paroxysmal atrial fibrillation) [facility]) February 2016    Anticoagulated with Eliquis. On combination beta blocker and calcium channel blocker  . Colon polyps   . TIA (transient ischemic attack)   . Insomnia   . Memory loss   . Lichen planus   . Allergy   . COPD (chronic obstructive pulmonary disease) ([place])   . Osteoporosis   . Cancer [facility])     Skin cancer    Patient Active Problem List   Diagnosis Date Noted  . Disorder of oral mucous membrane 02/10/2015  . CN (constipation) 02/10/2015  . DD (diverticular disease) 02/10/2015  . Accumulation of fluid in tissues 02/10/2015  . Elevated WBC count 02/10/2015  . Fatigue 02/10/2015  . Hemorrhoid 02/10/2015  . Hypoxia 02/10/2015  . Cannot sleep 02/10/2015  . Lichen planus 66/[id number]/9357  . Restless leg 02/10/2015  . 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 Temporary cerebral vascular dysfunction 02/10/2015  . B12 deficiency 02/10/2015  . SOB (shortness of breath) 12/14/2014  . Lower extremity edema 12/14/2014  . COPD (chronic obstructive pulmonary disease) ([place]) 11/14/2014  . Post  Inflammatory Lung Changes 11/14/2014  . Confusion state 11/08/2014  . Amnesia 11/08/2014  . PAF (paroxysmal atrial fibrillation) ([place])   . Hypotension 12/09/2013  . Insomnia 12/09/2013  . OSA on CPAP   . Erectile dysfunction   . Dyspnea on exertion - -essentially resolved with BB dose reduction & wgt loss 03/29/2013  . Overweight (BMI 25.0-29.9) -- Wgt back up 01/17/2013  . Atherosclerotic heart disease of native coronary artery without angina pectoris - s/p CABG, occluded SVG-D1; patent LIMA-LAD, SVG-OM, SVG- RCA    . Essential hypertension   . Hyperlipidemia with target LDL less than 70   . Aorto-iliac disease ([place])   . Basal cell carcinoma of eyelid 01/03/2013  . Carotid artery stenosis 09/09/2012  . ED (erectile dysfunction) of organic origin 11/19/2009  . Allergic rhinitis 08/17/2009  . Arthralgia of ankle or foot 06/04/2009  . Adaptation reaction 05/28/2009  . Arterial vascular disease 05/28/2009  . Benign prostatic hypertrophy without urinary obstruction 05/28/2009  . Acid reflux 05/28/2009  . Arthritis, degenerative 05/28/2009  . Need for prophylactic hormone replacement therapy (postmenopausal) 05/28/2009  . Current tobacco use 05/28/2009  . PAD (peripheral artery disease) - bilateral common iliac stents 12/15/2005    Past Surgical History  Procedure Laterality Date  . Coronary artery bypass graft  1998    LIMA-LAD, SVG-OM, SVG-RPDA, SVG-DIAG  . Cardiac catheterization  01/30/1998      total native LAD andRCA ,mod native CIRC; svg to RCA and OBTUSE MARG. PATENT the left internal mammary artery graft to LAD ; LV NORMAL  /  . Doppler echocardiography  11/20/2010    EF =>55%; LV norm mild aortic scelorosis  . Nm myocar perf wall motion  11/11/2010; February 2016    a) TM STRESS----NORMAL PERFUSION ,EF 68%; b) EF 50-55%. Normal LV function. No significant ischemia or infarction.  [name] Cpet / met  12/2012    Mild chronotropic incompetence - read 82% of predicted; also reduced effort; peak VO2 15.7 / 75% (did not reach Max effort) -- suggested ischemic response in last 1.5 minutes of exercise. Normal pulmonary function on PFTs but poor response to  . Arch aortogram and carotid aortogram  06/02/2006    Dr Oneida Alar did surgery  . Iliac artery stent  12/15/2005    PTA and direct stenting rgt and lft common iliac arteries  . Thoracic aorta - carotid angiogram  October 2007    Dr. 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 Carotid endarterectomy Left Oct. 29, 2007    Dr. Oneida Alar:  . Ankle surgery    . Hiatal hernia repair    . Transthoracic echocardiogram  February 2016    [facility]: Normal LV function. Dilated left atrium.  . Orif wrist fracture Right 03/19/2015    Procedure: OPEN REDUCTION INTERNAL FIXATION (ORIF) WRIST FRACTURE;  Surgeon: Hessie Knows, MD;  Location: [facility] ORS;  Service: Orthopedics;  Laterality: Right;  . Fracture surgery Right 03/19/2015    wrist    Past Surgical History  Procedure Laterality Date  . Coronary artery bypass graft  1998    LIMA-LAD, SVG-OM, SVG-RPDA, SVG-DIAG  . Cardiac catheterization  01/30/1998      total native LAD andRCA ,mod native CIRC; svg to RCA and OBTUSE MARG. PATENT the left internal mammary artery graft to LAD ; LV NORMAL  /  . Doppler echocardiography  11/20/2010    EF =>55%; LV norm mild aortic scelorosis  .  Nm myocar perf wall motion  11/11/2010; February 2016    a) TM STRESS----NORMAL PERFUSION ,EF 68%; b) EF 50-55%. Normal LV function. No significant ischemia or infarction.  [name] Cpet / met  12/2012    Mild chronotropic incompetence - read 82% of predicted; also reduced effort; peak VO2 15.7 / 75% (did not reach Max effort) -- suggested ischemic response in last 1.5 minutes of exercise. Normal pulmonary function on PFTs but poor response to  . Arch aortogram and carotid aortogram  06/02/2006    Dr Oneida Alar did surgery  . Iliac artery stent  12/15/2005    PTA and direct stenting rgt and lft common iliac arteries  . Thoracic aorta - carotid angiogram  October 2007    Dr. 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 Carotid endarterectomy Left Oct. 29, 2007    Dr. Oneida Alar:  . Ankle surgery    . Hiatal hernia repair    . Transthoracic echocardiogram  February 2016    [facility]: Normal LV function. Dilated left atrium.  . Orif wrist fracture Right 03/19/2015    Procedure:  OPEN REDUCTION INTERNAL FIXATION (ORIF) WRIST FRACTURE;  Surgeon: Hessie Knows, MD;  Location: [facility] ORS;  Service: Orthopedics;  Laterality: Right;  . Fracture surgery Right 03/19/2015    wrist    Current Outpatient Rx  Name  Route  Sig  Dispense  Refill  . albuterol (PROVENTIL HFA;VENTOLIN HFA) 108 (90 BASE) MCG/ACT inhaler   Inhalation   Inhale 2 puffs into the lungs every 4 (four) hours as needed for wheezing or shortness of breath.   1 Inhaler   4   . ALPRAZolam (XANAX) 0.5 MG tablet   Oral   Take 1 tablet (0.5 mg total) by mouth at bedtime as needed for anxiety.   90 tablet   1   . apixaban (ELIQUIS) 2.5 MG TABS tablet   Oral   Take 2.5 mg by mouth 2 (two) times daily.          . Ascorbic Acid (VITAMIN C) 1000 MG tablet   Oral   Take 1,000 mg by mouth daily.         . Biotin 1000 MCG tablet   Oral   Take 1,000 mcg by mouth daily.         [name] DEXILANT 60 MG capsule      TAKE ONE CAPSULE BY MOUTH EVERY DAY   30 capsule   11   . finasteride (PROSCAR) 5 MG tablet   Oral   Take 5 mg by mouth daily.         . Fluticasone-Salmeterol (ADVAIR) 250-50 MCG/DOSE AEPB   Inhalation   Inhale 1 puff into the lungs 2 (two) times daily.   60 each   5   . furosemide (LASIX) 20 MG tablet   Oral   Take 1 tablet (20 mg total) by mouth as needed (as needed for swelling).   30 tablet   3   . lisinopril (PRINIVIL,ZESTRIL) 2.5 MG tablet   Oral   Take 1 tablet (2.5 mg total) by mouth 2 (two) times daily.   180 tablet   6   . magnesium oxide (MAG-OX) 400 MG tablet   Oral   Take 400 mg by mouth daily.         . metoprolol tartrate (LOPRESSOR) 25 MG tablet      TAKE ONE TABLET TWICE DAILY   60  tablet   5   . Multiple Vitamins-Minerals (PRESERVISION AREDS) TABS   Oral   Take 1 tablet by mouth 2 (two) times daily.         . nitroGLYCERIN (NITROSTAT) 0.4 MG SL tablet   Sublingual   Place 1 tablet (0.4 mg total) under the tongue every 5 (five) minutes as needed  for chest pain.   25 tablet   3   . rosuvastatin (CRESTOR) 20 MG tablet      TAKE ONE TABLET EVERY DAY   90 tablet   1   . ZETIA 10 MG tablet      TAKE ONE TABLET EVERY DAY   90 tablet   1     Dispense as written.    PROFILE FOR NEXT MONTH     Allergies:  Clonazepam; Codeine; and Niacin and related  Family History: Family History  Problem Relation Age of Onset  . Ulcers Mother     Peptic  . Dementia Mother   . Alcohol abuse Father     Social History: Social History  Substance Use Topics  . Smoking status: Former Smoker    Types: Cigarettes    Quit date: 08/04/1997  . Smokeless tobacco: Never Used  . Alcohol Use: No     Review of Systems:   10 point review of systems was performed and was otherwise negative:  Constitutional: No fever Eyes: No visual disturbances ENT: No sore throat, ear pain Cardiac: No chest pain Respiratory: No shortness of breath, wheezing, or stridor Abdomen: No abdominal pain, no vomiting, No diarrhea Endocrine: No weight loss, No night sweats Extremities: No peripheral edema, cyanosis Skin: No rashes, easy bruising Neurologic: No focal weakness, trouble with speech or swollowing Urologic: No dysuria, Hematuria, or urinary frequency *  Physical Exam:  ED Triage Vitals  Enc Vitals Group     BP 12/12/15 1408 111/55 mmHg     Pulse Rate 12/12/15 1408 73     Resp 12/12/15 1408 18     Temp 12/12/15 1408 97.8 F (36.6 C)     Temp Source 12/12/15 1408 Oral     SpO2 12/12/15 1408 95 %     Weight 12/12/15 1408 228 lb (103.42 kg)     Height 12/12/15 1408 6' (1.829 m)     Head Cir --      Peak Flow --      Pain Score 12/12/15 1408 1     Pain Loc --      Pain Edu? --      Excl. in [place]? --     General: Awake , Alert , and Oriented times 3; GCS 15 Head:Mild abrasion right parietal region , no crepitus or step-off noted Eyes: Pupils equal , round, reactive to light Nose/Throat: No nasal drainage, patent upper airway without  erythema or exudate.  Neck: Supple, Full range of motion, No anterior adenopathy or palpable thyroid masses Lungs: Clear to ascultation without wheezes , rhonchi, or rales Heart: Regular rate, regular rhythm without murmurs , gallops , or rubs Abdomen: Soft, non tender without rebound, guarding , or rigidity; bowel sounds positive and symmetric in all 4 quadrants. No organomegaly .        Extremities: 2Multiple skin tears on the main one being the right forearm, left elbow, left knee. He has a small abrasion on the right side of his head without any crepitus or step-off noted. Skin tears. Be superficial with bleeding controlled there is an area of likely going  to be dead tissue that's rolled up on the right forearm area. He was premature to debride this area at this time. Neurologic: normal ambulation, Motor symmetric without deficits, sensory intact Skin: warm, dry, no rashes    Radiology:   COMPARISON: 03/14/2015 and earlier.  FINDINGS: Visualized paranasal sinuses and mastoids are clear. Right posterior scalp hematoma measuring up to 9 mm in thickness. Underlying calvarium intact. No other acute scalp or orbits soft tissue finding. No acute osseous abnormality identified. Calcified atherosclerosis at the skull base.  Chronic lacunar infarcts of the right deep gray matter. Mild for age patchy white matter hypodensity elsewhere. Stable cerebral volume. No midline shift, mass effect, or evidence of intracranial mass lesion. No ventriculomegaly. No acute intracranial hemorrhage identified. No cortically based acute infarct identified.  IMPRESSION: 1. Scalp soft tissue injury without underlying fracture. 2. No acute intracranial abnormality. Chronic small vessel disease appears stable since 2016.    I personally reviewed the radiologic studies   Procedures:  All wounds were cleaned and dressed here by the nursing staff with coverage. Mostly wet to dry on the right upper  extremity which seemed to be the worst skin tear   C   Final Clinical Impression: *  Final diagnoses:  Multiple skin tears  Head injury, initial encounter     Plan: * Outpatient management Patient was advised to return immediately if condition worsens. Patient was advised to follow up with their primary care physician or other specialized physicians involved in their outpatient care. The patient and/or family member/power of attorney had laboratory results reviewed at the bedside. All questions and concerns were addressed and appropriate discharge instructions were distributed by the nursing staff.            Daymon Larsen, MD 12/12/15 [phone]

## 2015-12-12 NOTE — ED Notes (Signed)
MD at bedside. 

## 2015-12-12 NOTE — ED Notes (Addendum)
Pt comes into the ED via POV c/o fall earlier today.  Patient denies LOC, but he did hit his head and he takes Eliquis.  A&Ox4 currently and in NAD.  Patient tripped over a package that fedex left on his front porch this morning.  He has had a recent broken right shoulder from a fall a couple of weeks ago.  Denies any dizziness, light headedness, N/V.  Patient states he feels tired but he is unsure if that is abnormal for him around this time of day.

## 2015-12-12 NOTE — Discharge Instructions (Signed)
Head Injury, Adult °You have a head injury. Headaches and throwing up (vomiting) are common after a head injury. It should be easy to wake up from sleeping. Sometimes you must stay in the hospital. Most problems happen within the first 24 hours. Side effects may occur up to 7-10 days after the injury.  °WHAT ARE THE TYPES OF HEAD INJURIES? °Head injuries can be as minor as a bump. Some head injuries can be more severe. More severe head injuries include: °· A jarring injury to the brain (concussion). °· A bruise of the brain (contusion). This mean there is bleeding in the brain that can cause swelling. °· A cracked skull (skull fracture). °· Bleeding in the brain that collects, clots, and forms a bump (hematoma). °WHEN SHOULD I GET HELP RIGHT AWAY?  °· You are confused or sleepy. °· You cannot be woken up. °· You feel sick to your stomach (nauseous) or keep throwing up (vomiting). °· Your dizziness or unsteadiness is getting worse. °· You have very bad, lasting headaches that are not helped by medicine. Take medicines only as told by your doctor. °· You cannot use your arms or legs like normal. °· You cannot walk. °· You notice changes in the black spots in the center of the colored part of your eye (pupil). °· You have clear or bloody fluid coming from your nose or ears. °· You have trouble seeing. °During the next 24 hours after the injury, you must stay with someone who can watch you. This person should get help right away (call 911 in the [place] if you start to shake and are not able to control it (have seizures), you pass out, or you are unable to wake up. °HOW CAN I PREVENT A HEAD INJURY IN THE FUTURE? °· Wear seat belts. °· Wear a helmet while bike riding and playing sports like football. °· Stay away from dangerous activities around the house. °WHEN CAN I RETURN TO NORMAL ACTIVITIES AND ATHLETICS? °See your doctor before doing these activities. You should not do normal activities or play contact sports until 1  week after the following symptoms have stopped: °· Headache that does not go away. °· Dizziness. °· Poor attention. °· Confusion. °· Memory problems. °· Sickness to your stomach or throwing up. °· Tiredness. °· Fussiness. °· Bothered by bright lights or loud noises. °· Anxiousness or depression. °· Restless sleep. °MAKE SURE YOU:  °· Understand these instructions. °· Will watch your condition. °· Will get help right away if you are not doing well or get worse. °  °This information is not intended to replace advice given to you by your health care provider. Make sure you discuss any questions you have with your health care provider. °  °Document Released: 07/31/2008 Document Revised: 09/08/2014 Document Reviewed: 04/25/2013 °Elsevier Interactive Patient Education ©2016 [employer] ° °

## 2015-12-17 DIAGNOSIS — S42254D Nondisplaced fracture of greater tuberosity of right humerus, subsequent encounter for fracture with routine healing: Secondary | ICD-10-CM | POA: Diagnosis not present

## 2015-12-17 DIAGNOSIS — S42254A Nondisplaced fracture of greater tuberosity of right humerus, initial encounter for closed fracture: Secondary | ICD-10-CM | POA: Diagnosis not present

## 2015-12-18 ENCOUNTER — Ambulatory Visit (INDEPENDENT_AMBULATORY_CARE_PROVIDER_SITE_OTHER): Payer: Medicare Other | Admitting: Family Medicine

## 2015-12-18 ENCOUNTER — Encounter: Payer: Self-pay | Admitting: Family Medicine

## 2015-12-18 VITALS — BP 126/70 | HR 80 | Temp 98.6°F | Resp 20 | Wt 226.0 lb

## 2015-12-18 DIAGNOSIS — I10 Essential (primary) hypertension: Secondary | ICD-10-CM

## 2015-12-18 DIAGNOSIS — R296 Repeated falls: Secondary | ICD-10-CM | POA: Diagnosis not present

## 2015-12-18 DIAGNOSIS — E785 Hyperlipidemia, unspecified: Secondary | ICD-10-CM

## 2015-12-18 DIAGNOSIS — I779 Disorder of arteries and arterioles, unspecified: Secondary | ICD-10-CM

## 2015-12-18 NOTE — Progress Notes (Signed)
Patient ID: [name], male   DO: 04/05/1934, 82 y.o.   MRM: [id number]       Patient: [name] Male    DO: [birth year]   82 y.o.   MRM: [id number] Visit Date: 12/18/2015  Today's Provider: Margarita Rana, MD   Chief Complaint  Patient presents with  . Fall  . Follow-up    ER   Subjective:    HPI Comments: Patient and [name] report that he has had 4 falls since July.   Fall The accident occurred 5 to 7 days ago. Fall occurred: walking out his front door and tripped over some mail . He fell from a height of 3 to 5 ft. He landed on concrete. The volume of blood lost was minimal. The point of impact was the head, left elbow, right elbow, right shoulder and left knee. The patient is experiencing no pain.  [name] reports that the patient was having clear discharge from left arm 12/16/2015. Patient reports swelling and denies fever or bad order from discharge. Patient reports that he was still wrapping arm to cover injuries and reports that the bandage might have been to tight around his arm.      Follow up ER visit  Patient was seen in ER for fall  on 12/12/2015. He was treated for head injury. Treatment for this included CT scan. He reports excellent compliance with treatment. He reports this condition is Improved.  ------------------------------------------------------------------------------------    Allergies  Allergen Reactions  . Clonazepam Other (See Comments)    Altered mental status  . Codeine   . Niacin And Related    Previous Medications   ALBUTEROL (PROVENTIL HFA;VENTOLIN HFA) 108 (90 BASE) MCG/ACT INHALER    Inhale 2 puffs into the lungs every 4 (four) hours as needed for wheezing or shortness of breath.   ALPRAZOLAM (XANAX) 0.5 MG TABLET    Take 1 tablet (0.5 mg total) by mouth at bedtime as needed for anxiety.   APIXABAN (ELIQUIS) 2.5 MG TABS TABLET    Take 2.5 mg by mouth 2 (two) times daily.    ASCORBIC ACID (VITAMIN C) 1000 MG TABLET    Take  1,000 mg by mouth daily.   BIOTIN 1000 MCG TABLET    Take 1,000 mcg by mouth daily.   DEXILANT 60 MG CAPSULE    TAKE ONE CAPSULE BY MOUTH EVERY DAY   FLUTICASONE-SALMETEROL (ADVAIR) 250-50 MCG/DOSE AEPB    Inhale 1 puff into the lungs 2 (two) times daily.   FUROSEMIDE (LASIX) 20 MG TABLET    Take 1 tablet (20 mg total) by mouth as needed (as needed for swelling).   LISINOPRIL (PRINIVIL,ZESTRIL) 2.5 MG TABLET    Take 1 tablet (2.5 mg total) by mouth 2 (two) times daily.   MAGNESIUM OXIDE (MAG-OX) 400 MG TABLET    Take 400 mg by mouth daily.   METOPROLOL TARTRATE (LOPRESSOR) 25 MG TABLET    TAKE ONE TABLET TWICE DAILY   MULTIPLE VITAMINS-MINERALS (PRESERVISION AREDS) TABS    Take 1 tablet by mouth 2 (two) times daily.   NITROGLYCERIN (NITROSTAT) 0.4 MG SL TABLET    Place 1 tablet (0.4 mg total) under the tongue every 5 (five) minutes as needed for chest pain.   ROSUVASTATIN (CRESTOR) 20 MG TABLET    TAKE ONE TABLET EVERY DAY   ZETIA 10 MG TABLET    TAKE ONE TABLET EVERY DAY    Review of Systems  Constitutional: Negative.   Musculoskeletal: Negative.  Social History  Substance Use Topics  . Smoking status: Former Smoker    Types: Cigarettes    Quit date: 08/04/1997  . Smokeless tobacco: Never Used  . Alcohol Use: No   Objective:   BP 126/70 mmHg  Pulse 80  Temp(Src) 98.6 F (37 C)  Resp 20  Wt 226 lb (102.513 kg)  Physical Exam  Constitutional: He is oriented to person, place, and time. He appears well-developed and well-nourished.  Cardiovascular: Normal rate, regular rhythm and normal heart sounds.   Pulmonary/Chest: Effort normal and breath sounds normal.  Musculoskeletal:  Right arm in sling/brace.    Neurological: He is alert and oriented to person, place, and time.      Assessment & Plan:     1. Frequent falls Recurrent. Patient referred to PT.  Gave handout on how to do pendulum exercises recommended by orthopedics.  - Ambulatory referral to Physical  Therapy  2. Essential hypertension F/U pending lab report. - CBC with Differential/Platelet - Comprehensive metabolic panel - TSH  3. Hyperlipidemia with target LDL less than 70 Stable.  Will check labs.   - Lipid Panel With LDL/HDL Ratio - TSH      Patient seen and examined by Dr. Jerrell Belfast, and note scribed by Philbert Riser. Dimas, CMA.  I have reviewed the document for accuracy and completeness and I agree with above. - Jerrell Belfast, MD   Margarita Rana, MD  [facility]

## 2015-12-18 NOTE — Patient Instructions (Signed)
Shoulder Range of Motion Exercises Shoulder range of motion (ROM) exercises are designed to keep the shoulder moving freely. They are often recommended for people who have shoulder pain. MOVEMENT EXERCISE When you are able, do this exercise 5-6 days per week, or as told by your health care provider. Work toward doing 2 sets of 10 swings. Pendulum Exercise How To Do This Exercise Lying Down 1. Lie face-down on a bed with your abdomen close to the side of the bed. 2. Let your arm hang over the side of the bed. 3. Relax your shoulder, arm, and hand. 4. Slowly and gently swing your arm forward and back. Do not use your neck muscles to swing your arm. They should be relaxed. If you are struggling to swing your arm, have someone gently swing it for you. When you do this exercise for the first time, swing your arm at a 15 degree angle for 15 seconds, or swing your arm 10 times. As pain lessens over time, increase the angle of the swing to 30-45 degrees. 5. Repeat steps 1-4 with the other arm. How To Do This Exercise While Standing 1. Stand next to a sturdy chair or table and hold on to it with your hand.  Bend forward at the waist.  Bend your knees slightly.  Relax your other arm and let it hang limp.  Relax the shoulder blade of the arm that is hanging and let it drop.  While keeping your shoulder relaxed, use body motion to swing your arm in small circles. The first time you do this exercise, swing your arm for about 30 seconds or 10 times. When you do it next time, swing your arm for a little longer.  Stand up tall and relax.  Repeat steps 1-7, this time changing the direction of the circles. 2. Repeat steps 1-8 with the other arm. STRETCHING EXERCISES Do these exercises 3-4 times per day on 5-6 days per week or as told by your health care provider. Work toward holding the stretch for 20 seconds. Stretching Exercise 1 1. Lift your arm straight out in front of you. 2. Bend your arm 90  degrees at the elbow (right angle) so your forearm goes across your body and looks like the letter "L." 3. Use your other arm to gently pull the elbow forward and across your body. 4. Repeat steps 1-3 with the other arm. Stretching Exercise 2 You will need a towel or rope for this exercise. 1. Bend one arm behind your back with the palm facing outward. 2. Hold a towel with your other hand. 3. Reach the arm that holds the towel above your head, and bend that arm at the elbow. Your wrist should be behind your neck. 4. Use your free hand to grab the free end of the towel. 5. With the higher hand, gently pull the towel up behind you. 6. With the lower hand, pull the towel down behind you. 7. Repeat steps 1-6 with the other arm. STRENGTHENING EXERCISES Do each of these exercises at four different times of day (sessions) every day or as told by your health care provider. To begin with, repeat each exercise 5 times (repetitions). Work toward doing 3 sets of 12 repetitions or as told by your health care provider. Strengthening Exercise 1 You will need a light weight for this activity. As you grow stronger, you may use a heavier weight. 1. Standing with a weight in your hand, lift your arm straight out to the side   until it is at the same height as your shoulder. 2. Bend your arm at 90 degrees so that your fingers are pointing to the ceiling. 3. Slowly raise your hand until your arm is straight up in the air. 4. Repeat steps 1-3 with the other arm. Strengthening Exercise 2 You will need a light weight for this activity. As you grow stronger, you may use a heavier weight. 1. Standing with a weight in your hand, gradually move your straight arm in an arc, starting at your side, then out in front of you, then straight up over your head. 2. Gradually move your other arm in an arc, starting at your side, then out in front of you, then straight up over your head. 3. Repeat steps 1-2 with the other  arm. Strengthening Exercise 3 You will need an elastic band for this activity. As you grow stronger, gradually increase the size of the bands or increase the number of bands that you use at one time. 1. While standing, hold an elastic band in one hand and raise that arm up in the air. 2. With your other hand, pull down the band until that hand is by your side. 3. Repeat steps 1-2 with the other arm.   This information is not intended to replace advice given to you by your health care provider. Make sure you discuss any questions you have with your health care provider.   Document Released: 05/17/2003 Document Revised: 01/02/2015 Document Reviewed: 08/14/2014 Elsevier Interactive Patient Education 2016 [employer]  

## 2015-12-20 ENCOUNTER — Other Ambulatory Visit: Payer: Self-pay | Admitting: Internal Medicine

## 2015-12-20 DIAGNOSIS — I10 Essential (primary) hypertension: Secondary | ICD-10-CM | POA: Diagnosis not present

## 2015-12-20 DIAGNOSIS — E785 Hyperlipidemia, unspecified: Secondary | ICD-10-CM | POA: Diagnosis not present

## 2015-12-21 LAB — CBC WITH DIFFERENTIAL/PLATELET
Basophils Absolute: 0.1 10*3/uL (ref 0.0–0.2)
Basos: 0 %
EOS (ABSOLUTE): 0.2 10*3/uL (ref 0.0–0.4)
Eos: 2 %
Hematocrit: 41.8 % (ref 37.5–51.0)
Hemoglobin: 13.8 g/dL (ref 12.6–17.7)
Immature Grans (Abs): 0 10*3/uL (ref 0.0–0.1)
Immature Granulocytes: 0 %
Lymphocytes Absolute: 2.4 10*3/uL (ref 0.7–3.1)
Lymphs: 21 %
MCH: 30.1 pg (ref 26.6–33.0)
MCHC: 33 g/dL (ref 31.5–35.7)
MCV: 91 fL (ref 79–97)
Monocytes Absolute: 1 10*3/uL — ABNORMAL HIGH (ref 0.1–0.9)
Monocytes: 9 %
Neutrophils Absolute: 7.7 10*3/uL — ABNORMAL HIGH (ref 1.4–7.0)
Neutrophils: 68 %
Platelets: 227 10*3/uL (ref 150–379)
RBC: 4.58 x10E6/uL (ref 4.14–5.80)
RDW: 14.4 % (ref 12.3–15.4)
WBC: 11.3 10*3/uL — ABNORMAL HIGH (ref 3.4–10.8)

## 2015-12-21 LAB — COMPREHENSIVE METABOLIC PANEL
ALT: 11 IU/L (ref 0–44)
AST: 18 IU/L (ref 0–40)
Albumin/Globulin Ratio: 1.6 (ref 1.2–2.2)
Albumin: 4.1 g/dL (ref 3.5–4.7)
Alkaline Phosphatase: 113 IU/L (ref 39–117)
BUN/Creatinine Ratio: 15 (ref 10–24)
BUN: 16 mg/dL (ref 8–27)
Bilirubin Total: 0.9 mg/dL (ref 0.0–1.2)
CO2: 22 mmol/L (ref 18–29)
Calcium: 9.4 mg/dL (ref 8.6–10.2)
Chloride: 103 mmol/L (ref 96–106)
Creatinine, Ser: 1.08 mg/dL (ref 0.76–1.27)
GFR calc Af Amer: 75 mL/min/{1.73_m2} (ref >59)
GFR calc non Af Amer: 64 mL/min/{1.73_m2} (ref >59)
Globulin, Total: 2.5 g/dL (ref 1.5–4.5)
Glucose: 95 mg/dL (ref 65–99)
Potassium: 4.5 mmol/L (ref 3.5–5.2)
Sodium: 143 mmol/L (ref 134–144)
Total Protein: 6.6 g/dL (ref 6.0–8.5)

## 2015-12-21 LAB — LIPID PANEL WITH LDL/HDL RATIO
Cholesterol, Total: 124 mg/dL (ref 100–199)
HDL: 35 mg/dL — ABNORMAL LOW (ref >39)
LDL Calculated: 63 mg/dL (ref 0–99)
LDl/HDL Ratio: 1.8 ratio units (ref 0.0–3.6)
Triglycerides: 132 mg/dL (ref 0–149)
VLDL Cholesterol Cal: 26 mg/dL (ref 5–40)

## 2015-12-21 LAB — TSH: TSH: 5.98 u[IU]/mL — ABNORMAL HIGH (ref 0.450–4.500)

## 2016-01-07 DIAGNOSIS — S42254D Nondisplaced fracture of greater tuberosity of right humerus, subsequent encounter for fracture with routine healing: Secondary | ICD-10-CM | POA: Diagnosis not present

## 2016-01-08 DIAGNOSIS — N401 Enlarged prostate with lower urinary tract symptoms: Secondary | ICD-10-CM | POA: Diagnosis not present

## 2016-01-14 DIAGNOSIS — M25511 Pain in right shoulder: Secondary | ICD-10-CM | POA: Diagnosis not present

## 2016-01-17 DIAGNOSIS — R3914 Feeling of incomplete bladder emptying: Secondary | ICD-10-CM | POA: Diagnosis not present

## 2016-01-17 DIAGNOSIS — N401 Enlarged prostate with lower urinary tract symptoms: Secondary | ICD-10-CM | POA: Diagnosis not present

## 2016-01-17 DIAGNOSIS — M25511 Pain in right shoulder: Secondary | ICD-10-CM | POA: Diagnosis not present

## 2016-01-17 DIAGNOSIS — R3915 Urgency of urination: Secondary | ICD-10-CM | POA: Diagnosis not present

## 2016-01-17 DIAGNOSIS — R350 Frequency of micturition: Secondary | ICD-10-CM | POA: Diagnosis not present

## 2016-01-21 DIAGNOSIS — M25511 Pain in right shoulder: Secondary | ICD-10-CM | POA: Diagnosis not present

## 2016-01-24 DIAGNOSIS — M25511 Pain in right shoulder: Secondary | ICD-10-CM | POA: Diagnosis not present

## 2016-01-29 DIAGNOSIS — M25511 Pain in right shoulder: Secondary | ICD-10-CM | POA: Diagnosis not present

## 2016-01-31 DIAGNOSIS — M25511 Pain in right shoulder: Secondary | ICD-10-CM | POA: Diagnosis not present

## 2016-02-04 ENCOUNTER — Other Ambulatory Visit: Payer: Self-pay | Admitting: Family Medicine

## 2016-02-04 DIAGNOSIS — K219 Gastro-esophageal reflux disease without esophagitis: Secondary | ICD-10-CM

## 2016-02-04 DIAGNOSIS — M25511 Pain in right shoulder: Secondary | ICD-10-CM | POA: Diagnosis not present

## 2016-02-07 DIAGNOSIS — M25511 Pain in right shoulder: Secondary | ICD-10-CM | POA: Diagnosis not present

## 2016-02-11 DIAGNOSIS — M25511 Pain in right shoulder: Secondary | ICD-10-CM | POA: Diagnosis not present

## 2016-02-13 DIAGNOSIS — G3184 Mild cognitive impairment, so stated: Secondary | ICD-10-CM | POA: Diagnosis not present

## 2016-02-13 DIAGNOSIS — L439 Lichen planus, unspecified: Secondary | ICD-10-CM | POA: Diagnosis not present

## 2016-02-13 DIAGNOSIS — R410 Disorientation, unspecified: Secondary | ICD-10-CM | POA: Diagnosis not present

## 2016-02-13 DIAGNOSIS — Z9989 Dependence on other enabling machines and devices: Secondary | ICD-10-CM | POA: Diagnosis not present

## 2016-02-13 DIAGNOSIS — G4733 Obstructive sleep apnea (adult) (pediatric): Secondary | ICD-10-CM | POA: Diagnosis not present

## 2016-02-14 DIAGNOSIS — M25511 Pain in right shoulder: Secondary | ICD-10-CM | POA: Diagnosis not present

## 2016-02-21 ENCOUNTER — Other Ambulatory Visit: Payer: Self-pay | Admitting: Family Medicine

## 2016-02-21 DIAGNOSIS — M25511 Pain in right shoulder: Secondary | ICD-10-CM | POA: Diagnosis not present

## 2016-02-21 DIAGNOSIS — I10 Essential (primary) hypertension: Secondary | ICD-10-CM

## 2016-03-07 DIAGNOSIS — M7581 Other shoulder lesions, right shoulder: Secondary | ICD-10-CM | POA: Diagnosis not present

## 2016-03-11 DIAGNOSIS — Z08 Encounter for follow-up examination after completed treatment for malignant neoplasm: Secondary | ICD-10-CM | POA: Diagnosis not present

## 2016-03-11 DIAGNOSIS — D485 Neoplasm of uncertain behavior of skin: Secondary | ICD-10-CM | POA: Diagnosis not present

## 2016-03-11 DIAGNOSIS — L570 Actinic keratosis: Secondary | ICD-10-CM | POA: Diagnosis not present

## 2016-03-11 DIAGNOSIS — C44319 Basal cell carcinoma of skin of other parts of face: Secondary | ICD-10-CM | POA: Diagnosis not present

## 2016-03-11 DIAGNOSIS — Z85828 Personal history of other malignant neoplasm of skin: Secondary | ICD-10-CM | POA: Diagnosis not present

## 2016-03-11 DIAGNOSIS — C44519 Basal cell carcinoma of skin of other part of trunk: Secondary | ICD-10-CM | POA: Diagnosis not present

## 2016-03-11 DIAGNOSIS — X32XXXA Exposure to sunlight, initial encounter: Secondary | ICD-10-CM | POA: Diagnosis not present

## 2016-03-17 DIAGNOSIS — R350 Frequency of micturition: Secondary | ICD-10-CM | POA: Diagnosis not present

## 2016-03-17 DIAGNOSIS — R351 Nocturia: Secondary | ICD-10-CM | POA: Diagnosis not present

## 2016-03-17 DIAGNOSIS — R3915 Urgency of urination: Secondary | ICD-10-CM | POA: Diagnosis not present

## 2016-03-17 DIAGNOSIS — R3914 Feeling of incomplete bladder emptying: Secondary | ICD-10-CM | POA: Diagnosis not present

## 2016-03-17 DIAGNOSIS — N3281 Overactive bladder: Secondary | ICD-10-CM | POA: Diagnosis not present

## 2016-03-17 DIAGNOSIS — N401 Enlarged prostate with lower urinary tract symptoms: Secondary | ICD-10-CM | POA: Diagnosis not present

## 2016-03-19 ENCOUNTER — Ambulatory Visit (INDEPENDENT_AMBULATORY_CARE_PROVIDER_SITE_OTHER): Payer: Medicare Other | Admitting: Cardiology

## 2016-03-19 ENCOUNTER — Encounter: Payer: Self-pay | Admitting: Cardiology

## 2016-03-19 VITALS — BP 100/60 | HR 70 | Ht 72.0 in | Wt 226.8 lb

## 2016-03-19 DIAGNOSIS — I2510 Atherosclerotic heart disease of native coronary artery without angina pectoris: Secondary | ICD-10-CM

## 2016-03-19 DIAGNOSIS — I480 Paroxysmal atrial fibrillation: Secondary | ICD-10-CM

## 2016-03-19 DIAGNOSIS — I739 Peripheral vascular disease, unspecified: Secondary | ICD-10-CM

## 2016-03-19 DIAGNOSIS — I779 Disorder of arteries and arterioles, unspecified: Secondary | ICD-10-CM | POA: Diagnosis not present

## 2016-03-19 DIAGNOSIS — I10 Essential (primary) hypertension: Secondary | ICD-10-CM

## 2016-03-19 DIAGNOSIS — E785 Hyperlipidemia, unspecified: Secondary | ICD-10-CM

## 2016-03-19 DIAGNOSIS — E663 Overweight: Secondary | ICD-10-CM

## 2016-03-19 MED ORDER — METOPROLOL TARTRATE 25 MG PO TABS
25.0000 mg | ORAL_TABLET | Freq: Two times a day (BID) | ORAL | Status: DC
Start: 2016-03-19 — End: 2016-11-18

## 2016-03-19 NOTE — Progress Notes (Signed)
PCP: Lelon Huh, MD  Clinic Note: Chief Complaint  Patient presents with  . Follow-up    some SOB, tiredness with activity, per pt  . Coronary Artery Disease    HPI: [name] is a 82 y.o. male with a PMH below who presents today for Six-month follow-up of CAD and PAF.[name] PMH notable for CAD s/p CABGx4 in 1999, carotid artery occlusion s/p L CEA, ischemic heart disease with chronotropic incompetence, HLD, HTN, OSA on CPAP, aortoiliac occlusive disease s/p common iliac artery stenting 2007.  [name] was last seen in early January 2017. He is doing pretty well with no real noticeable edema. Not having to use when necessary Lasix. Was using a bicycle and exercise class. Noted some bruising with Eliquis.  Recent Hospitalizations: None  Studies Reviewed: Carotid and aortoiliac ultrasound reviewed.  Interval History: [name] presents today doing relatively well from a CV standpoint.  He is no longer taking standing Lasix & only needs PRN dosing on rare occasions.   His major issue is that he is starting to have some balance issues & has had several significant falls with injuries -- broke wrist last year, R shoulder this spring & several abrasions /skin tears.  As a result, he has not been as active as usual with exercise - but is now at least getting back to the gym 1-2 x /week as opposed to 4-5x/week last year.  He does note that he gets tireed out a bit easier & is a bit more SOB/DOE - but this is improving week to week. Currently, he is dealing with some nasal congestion related to allergies & notes that breathing is more difficult.  His GF notes that several mornings out of the week, he has "mushy mouth" with garbled speech that will last up to ~ 1-2 hrs.  Not every morning & no associated neurologic findings.   From a strictly CV standpoint, he denies chest pain / pressure with rest or exertion, but does have some DOE. No PND, orthopnea or edema. No palpitations,  lightheadedness, dizziness, weakness or syncope/near syncope. No TIA/amaurosis fugax symptoms. No melena, hematochezia, hematuria, or epstaxis. No claudication.  ROS: A comprehensive was performed. Review of Systems  Constitutional: Negative for fever, chills, weight loss and malaise/fatigue.  HENT: Positive for congestion.   Eyes:       Some burning in eyes related to allergies   Respiratory: Positive for shortness of breath. Negative for sputum production and wheezing.   Cardiovascular: Positive for leg swelling (Usually mild L>R LE).       Per HPI.  Musculoskeletal: Positive for joint pain (R shoulder - recovering from injury).  Neurological: Negative for dizziness.       ? Dysarthria - transient.  "mushy mouth" - noted some mornings. No other associated Neuro findings.  Psychiatric/Behavioral: Negative for depression and memory loss. The patient does not have insomnia.     Past Medical History  Diagnosis Date  . Atherosclerotic heart disease of native coronary artery without angina pectoris 1998    Multivessel disease on cath for angina: S/p CABG x 4 (LIMA-LAD, SVG-RCA, SVG-OM, SVG-DIAG); SVG-Diag occluded on cath 1999  . Atherosclerotic heart disease of artery bypass graft 1999    a) Cardiac Cath : Occluded SVG-D1;; ostial LM 80-90%, diffuse LAD dz w/ D1 &2 subtotalled, LCx 70% prox with competitive flow from SVG-OM, RCA 100% occluded; patent LIMA-LAD, LC-RCA and SVG-OM; b) TM Myoview 10/2010: Ex 6.5 min - 9 METS, HR to 149,  No Ischemia or infarct, PVCs; c) Echo 10/2010: EF >55%, mild AoSclerosis, Conc Remodelling.; Myoview 2/ 2016: EF 55%. Low   risk. No ischemia.  . Carotid artery occlusion without infarction 06/2006    a) High Grade L Carotid Stenosis on Doppler -> s/x L CEA; b) Carotid Doppler 10/2013:: Left CEA patent, R. ICA 60-70% stenosis  . 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  . Aortoiliac occlusive disease [facility]) April 2007    Status post bilateral common iliac artery stenting  . Ischemic heart disease with chronotropic incompetence     Seen on CPET to evaluate Exertional Dyspnea. Only able to achieve 80% of peak heart rate; peak VO2 was 75% - but possibly underestimated.  . Hyperlipidemia with target LDL less than 70   . Essential hypertension   . OSA on CPAP   . Erectile dysfunction due to arterial insufficiency   . Sciatic pain     Chronic  . GERD (gastroesophageal reflux disease)   . PAF (paroxysmal atrial fibrillation) ([place]) February 2016    CHA2DS2-VASc = 4. Anticoagulated with Eliquis. On combination beta blocker and calcium channel blocker for rate control  . Colon polyps   . TIA (transient ischemic attack)   . Insomnia   . Memory loss   . Lichen planus   . Allergy   . COPD (chronic obstructive pulmonary disease) ([place])   . Osteoporosis   . Cancer [facility])     Skin cancer    Past Surgical History  Procedure Laterality Date  . Coronary artery bypass graft  1998    LIMA-LAD, SVG-OM, SVG-RPDA, SVG-DIAG  . Cardiac catheterization  01/30/1998      total native LAD andRCA ,mod native CIRC; svg to RCA and OBTUSE MARG. PATENT the left internal mammary artery graft to LAD ; LV NORMAL  /  . Doppler echocardiography  11/20/2010    EF =>55%; LV norm mild aortic scelorosis  . Nm myocar perf wall motion  11/11/2010; February 2016    a) TM STRESS----NORMAL PERFUSION ,EF 68%; b) EF 50-55%. Normal LV function. No significant ischemia or infarction.  [name] Cpet / met  12/2012    Mild chronotropic incompetence - read 82% of predicted; also reduced effort; peak VO2 15.7 / 75% (did not reach Max effort) -- suggested ischemic response in last 1.5 minutes of exercise. Normal pulmonary function on PFTs but poor response to  . Arch aortogram and carotid aortogram  06/02/2006    Dr Oneida Alar did  surgery  . Iliac artery stent  12/15/2005    PTA and direct stenting rgt and lft common iliac arteries  . Thoracic aorta - carotid angiogram  October 2007    Dr. 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 Carotid endarterectomy Left Oct. 29, 2007    Dr. Oneida Alar:  . Ankle surgery    . Hiatal hernia repair    . Transthoracic echocardiogram  February 2016    [facility]: Normal LV function. Dilated left atrium.  . Orif wrist fracture Right 03/19/2015    Procedure: OPEN REDUCTION INTERNAL FIXATION (ORIF) WRIST FRACTURE;  Surgeon: Hessie Knows, MD;  Location: [facility] ORS;  Service: Orthopedics;  Laterality: Right;  . Fracture surgery Right 03/19/2015    wrist    Prior to Admission  medications   Medication Sig Start Date End Date Taking? Authorizing Provider  ADVAIR DISKUS 250-50 MCG/DOSE AEPB INHALE 1 PUFF 2 TIMES A DAY 12/20/15  Yes Vishal Mungal, MD  albuterol (PROVENTIL HFA;VENTOLIN HFA) 108 (90 BASE) MCG/ACT inhaler Inhale 2 puffs into the lungs every 4 (four) hours as needed for wheezing or shortness of breath. 11/14/14  Yes Vishal Mungal, MD  ALPRAZolam (XANAX) 0.5 MG tablet Take 1 tablet (0.5 mg total) by mouth at bedtime as needed for anxiety. 06/21/15  Yes Margarita Rana, MD  apixaban (ELIQUIS) 2.5 MG TABS tablet Take 2.5 mg by mouth 2 (two) times daily.    Yes Historical Provider, MD  Ascorbic Acid (VITAMIN C) 1000 MG tablet Take 1,000 mg by mouth daily.   Yes Historical Provider, MD  Biotin 1000 MCG tablet Take 1,000 mcg by mouth daily.   Yes Historical Provider, MD  DEXILANT 60 MG capsule TAKE 1 CAPSULE EVERY DAY 02/04/16  Yes Margarita Rana, MD  lisinopril (PRINIVIL,ZESTRIL) 2.5 MG tablet TAKE ONE TABLET TWICE DAILY 02/21/16  Yes Margarita Rana, MD  magnesium oxide (MAG-OX) 400 MG tablet Take 400 mg by mouth daily.   Yes Historical Provider, MD  metoprolol tartrate (LOPRESSOR) 25 MG tablet TAKE ONE TABLET TWICE  DAILY 10/05/15  Yes Leonie Man, MD  mirabegron ER (MYRBETRIQ) 25 MG TB24 tablet Take 25 mg by mouth daily.   Yes Historical Provider, MD  Multiple Vitamins-Minerals (PRESERVISION AREDS) TABS Take 1 tablet by mouth 2 (two) times daily.   Yes Historical Provider, MD  nitroGLYCERIN (NITROSTAT) 0.4 MG SL tablet Place 1 tablet (0.4 mg total) under the tongue every 5 (five) minutes as needed for chest pain. 09/13/14  Yes Leonie Man, MD  rosuvastatin (CRESTOR) 20 MG tablet TAKE ONE TABLET EVERY DAY 12/07/15  Yes Margarita Rana, MD  ZETIA 10 MG tablet TAKE ONE TABLET EVERY DAY 09/11/15  Yes Margarita Rana, MD    Allergies  Allergen Reactions  . Clonazepam Other (See Comments)    Altered mental status  . Codeine   . Niacin And Related      Social History   Social History  . Marital Status: Widowed    Spouse Name: N/A  . Number of Children: N/A  . Years of Education: N/A   Social History Main Topics  . Smoking status: Former Smoker    Types: Cigarettes    Quit date: 08/04/1997  . Smokeless tobacco: Never Used  . Alcohol Use: No  . Drug Use: No  . Sexual Activity: Not Asked   Other Topics Concern  . None   Social History Narrative   He is a widowed father of one, grandfather of 32 with 3 stepchildren. He has been working out vigorously at [employer] the basis. He's up to doing 20 minutes on the stationary bike and 10-15 minutes on the treadmill. He doesn't smoke or drink.    Family History  Problem Relation Age of Onset  . Ulcers Mother     Peptic  . Dementia Mother   . Alcohol abuse Father     Wt Readings from Last 3 Encounters:  03/19/16 226 lb 12.8 oz (102.876 kg)  12/18/15 226 lb (102.513 kg)  12/12/15 228 lb (103.42 kg)    PHYSICAL EXAM BP 100/60 mmHg  Pulse 70  Ht 6' (1.829 m)  Wt 226 lb 12.8 oz (102.876 kg)  BMI 30.75 kg/m2  SpO2 91% General appearance: A&O X 3, cooperative, appears stated age, no distress and healthy-appearing,  well-nourished and well-groomed.  Very pleasant mood and affect. Answers questions appropriately.  HEENT: [place]/AT, EOMI, MMM, anicteric sclera Neck: no adenopathy, no carotid bruit, no JVD and supple, symmetrical, trachea midline  Lungs: CTAB, normal percussion bilaterally and Nonlabored, good air movement (slight upper airways "wheezing") Heart: RRR, S1: normal, S2: physiologically split, no S3 or S4; 1/6 c-d/ Early peaking SEM at 2nd right IS-->to carotids and no rub Abdomen: soft, non-tender; bowel sounds normal; no masses, no HSM; mild residual truncal obesity  Extremities: extremities normal, atraumatic, no cyanosis with minimal bilateral lower extremity edema.,diffuse mild areas of bruising on Bilateral arms. Pulses: 2+ and symmetric  Neurologic: normal strength and tone. Normal symmetric reflexes. Normal coordination and gait    Adult ECG Report  Rate: 73 ;  Rhythm: normal sinus rhythm, premature ventricular contractions (PVC) and RBBB; CRO Inf MI, age undermined;   Narrative Interpretation: STable EKG.  Normal axis, intervals & durations.   Other studies Reviewed: Additional studies/ records that were reviewed today include:  Recent Labs:   Lab Results  Component Value Date   CHOL 124 12/20/2015   HDL 35* 12/20/2015   LDLCALC 63 12/20/2015   TRIG 132 12/20/2015   CHOLHDL 3.4 03/15/2015     ASSESSMENT / PLAN: Problem List Items Addressed This Visit    PAF (paroxysmal atrial fibrillation) (HCC) CHA2DS2-VASc = 4. AC = Eliquis (Chronic)    Still maintaining sinus rhythm on beta blocker only now. Diltiazem was stopped. No signs of recurrence. Remains on Eliquis [name] regulation. He clearly has bruising bleeding that is exacerbated by his recent falls.      Relevant Medications   metoprolol tartrate (LOPRESSOR) 25 MG tablet   Other Relevant Orders   EKG 12-Lead (Completed)   PAD (peripheral artery disease) - bilateral common iliac stents (Chronic)    No claudication. Followed by Dr. Oneida Alar from  vascular surgery.      Relevant Medications   metoprolol tartrate (LOPRESSOR) 25 MG tablet   Overweight (BMI 25.0-29.9) -- Wgt back up (Chronic)    His weight is up from last visit, partly because he is no longer exercising as much. I think a good target weight for him to be around 195-205 pounds. He is now starting to exercise, and I suspect his weight will go back down again.      Hyperlipidemia with target LDL less than 70 (Chronic)    Labs look good from April. Is on Zetia plus Crestor. Followed by PCP.      Relevant Medications   metoprolol tartrate (LOPRESSOR) 25 MG tablet   Essential hypertension (Chronic)    Excellent blood pressure control. Remain stable on ACE inhibitor and Lopressor.      Relevant Medications   metoprolol tartrate (LOPRESSOR) 25 MG tablet   Atherosclerotic heart disease of native coronary artery without angina pectoris - s/p CABG, occluded SVG-D1; patent LIMA-LAD, SVG-OM, SVG- RCA - Primary (Chronic)    Overall doing pretty well. It's hard to tell if he is having symptoms at this point, because he is not as active as he had been. However he is now starting to get back in exercising, and is not having any resting or exertional angina. He does have some dyspnea is probably a conditioning. Myoview last year was negative for ischemia.  He remains on beta blocker and ACE inhibitor. Not on aspirin and Plavix as he is on Eliquis.  He is on Crestor plus Zetia      Relevant Medications   metoprolol tartrate (  LOPRESSOR) 25 MG tablet      Current medicines are reviewed at length with the patient today. (+/- concerns) n/a The following changes have been made: n/a Studies Ordered:   Orders Placed This Encounter  Procedures  . EKG 12-Lead    ROV 1 yr @ [facility] in [place].    Leonie Man, M.D., M.S.  [employer]  [address] [place], [place] [address] [phone] Fax [phone]

## 2016-03-19 NOTE — Patient Instructions (Signed)
Medication Instructions:  Your physician recommends that you continue on your current medications as directed. Please refer to the Current Medication list given to you today.   Labwork: none  Testing/Procedures: none  Follow-Up: Your physician wants you to follow-up in: 1 year with Dr. Ellyn Hack in [place]. You will receive a reminder letter in the mail two months in advance. If you don't receive a letter, please call our office to schedule the follow-up appointment.   Any Other Special Instructions Will Be Listed Below (If Applicable).     If you need a refill on your cardiac medications before your next appointment, please call your pharmacy.

## 2016-03-20 ENCOUNTER — Encounter: Payer: Self-pay | Admitting: Cardiology

## 2016-03-20 ENCOUNTER — Encounter: Payer: Self-pay | Admitting: Physical Therapy

## 2016-03-20 ENCOUNTER — Ambulatory Visit: Payer: Medicare Other | Attending: Orthopedic Surgery | Admitting: Physical Therapy

## 2016-03-20 DIAGNOSIS — M6281 Muscle weakness (generalized): Secondary | ICD-10-CM | POA: Insufficient documentation

## 2016-03-20 DIAGNOSIS — R262 Difficulty in walking, not elsewhere classified: Secondary | ICD-10-CM | POA: Insufficient documentation

## 2016-03-20 NOTE — Assessment & Plan Note (Addendum)
Overall doing pretty well. It's hard to tell if he is having symptoms at this point, because he is not as active as he had been. However he is now starting to get back in exercising, and is not having any resting or exertional angina. He does have some dyspnea is probably a conditioning. Myoview last year was negative for ischemia.  He remains on beta blocker and ACE inhibitor. Not on aspirin and Plavix as he is on Eliquis.  He is on Crestor plus Zetia

## 2016-03-20 NOTE — Assessment & Plan Note (Signed)
Still maintaining sinus rhythm on beta blocker only now. Diltiazem was stopped. No signs of recurrence. Remains on Eliquis [place] regulation. He clearly has bruising bleeding that is exacerbated by his recent falls.

## 2016-03-20 NOTE — Assessment & Plan Note (Signed)
Labs look good from April. Is on Zetia plus Crestor. Followed by PCP.

## 2016-03-20 NOTE — Assessment & Plan Note (Signed)
Excellent blood pressure control. Remain stable on ACE inhibitor and Lopressor.

## 2016-03-20 NOTE — Assessment & Plan Note (Signed)
His weight is up from last visit, partly because he is no longer exercising as much. I think a good target weight for him to be around 195-205 pounds. He is now starting to exercise, and I suspect his weight will go back down again.

## 2016-03-20 NOTE — Therapy (Signed)
[place] MAIN [facility] SERVICES [place], [place], [address] Phone: [phone]   Fax:  [phone]  Physical Therapy Evaluation  Patient Details  Name: [name] MRN: [id number] Date of Birth: December 24, 1933 Referring Provider: Duanne Guess  Encounter Date: 03/20/2016    Past Medical History  Diagnosis Date  . Atherosclerotic heart disease of native coronary artery without angina pectoris 1998    Multivessel disease on cath for angina: S/p CABG x 4 (LIMA-LAD, SVG-RCA, SVG-OM, SVG-DIAG); SVG-Diag occluded on cath 1999  . Atherosclerotic heart disease of artery bypass graft 1999    a) Cardiac Cath : Occluded SVG-D1;; ostial LM 80-90%, diffuse LAD dz w/ D1 &2 subtotalled, LCx 70% prox with competitive flow from SVG-OM, RCA 100% occluded; patent LIMA-LAD, LC-RCA and SVG-OM; b) TM Myoview 10/2010: Ex 6.5 min - 9 METS, HR to 149, No Ischemia or infarct, PVCs; c) Echo 10/2010: EF >55%, mild AoSclerosis, Conc Remodelling.; Myoview 2/ 2016: EF 55%. Low   risk. No ischemia.  . Carotid artery occlusion without infarction 06/2006    a) High Grade L Carotid Stenosis on Doppler -> s/x L CEA; b) Carotid Doppler 10/2013:: Left CEA patent, R. ICA 60-70% stenosis  . 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  . Aortoiliac occlusive disease [facility]) April 2007    Status post bilateral common iliac artery stenting  . Ischemic heart disease with chronotropic incompetence     Seen on CPET to evaluate Exertional Dyspnea. Only able to achieve 80% of peak heart rate; peak VO2 was 75% - but possibly underestimated.  . Hyperlipidemia with target LDL less than 70   . Essential hypertension   . OSA on CPAP   . Erectile dysfunction due to arterial insufficiency   . Sciatic pain    Chronic  . GERD (gastroesophageal reflux disease)   . PAF (paroxysmal atrial fibrillation) [facility]) February 2016    Anticoagulated with Eliquis. On combination beta blocker and calcium channel blocker  . Colon polyps   . TIA (transient ischemic attack)   . Insomnia   . Memory loss   . Lichen planus   . Allergy   . COPD (chronic obstructive pulmonary disease) ([place])   . Osteoporosis   . Cancer [facility])     Skin cancer    Past Surgical History  Procedure Laterality Date  . Coronary artery bypass graft  1998    LIMA-LAD, SVG-OM, SVG-RPDA, SVG-DIAG  . Cardiac catheterization  01/30/1998      total native LAD andRCA ,mod native CIRC; svg to RCA and OBTUSE MARG. PATENT the left internal mammary artery graft to LAD ; LV NORMAL  /  . Doppler echocardiography  11/20/2010    EF =>55%; LV norm mild aortic scelorosis  . Nm myocar perf wall motion  11/11/2010; February 2016    a) TM STRESS----NORMAL PERFUSION ,EF 68%; b) EF 50-55%. Normal LV function. No significant ischemia or infarction.  [name] Cpet / met  12/2012    Mild chronotropic incompetence - read 82% of predicted; also reduced effort; peak VO2 15.7 / 75% (did not reach Max effort) -- suggested ischemic response in last 1.5 minutes of exercise. Normal pulmonary function on PFTs but poor response to  . Arch aortogram and carotid aortogram  06/02/2006    Dr Oneida Alar did surgery  .  Iliac artery stent  12/15/2005    PTA and direct stenting rgt and lft common iliac arteries  . Thoracic aorta - carotid angiogram  October 2007    Dr. 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 Carotid endarterectomy Left Oct. 29, 2007    Dr. Oneida Alar:  . Ankle surgery    . Hiatal hernia repair    . Transthoracic echocardiogram  February 2016    [facility]: Normal LV function. Dilated left atrium.  . Orif wrist fracture Right 03/19/2015    Procedure: OPEN REDUCTION INTERNAL FIXATION (ORIF)  WRIST FRACTURE;  Surgeon: Hessie Knows, MD;  Location: [facility] ORS;  Service: Orthopedics;  Laterality: Right;  . Fracture surgery Right 03/19/2015    wrist    There were no vitals filed for this visit.       Subjective Assessment - 03/20/16 0853    Subjective Patient reports having unsteady gait and several falls.    Currently in Pain? No/denies            [facility] PT Assessment - 03/20/16 0001    Assessment   Medical Diagnosis usteady gait   Referring Provider Dorise Hiss C   Onset Date/Surgical Date 11/30/15   Prior Therapy yes   Precautions   Precautions Fall   Restrictions   Weight Bearing Restrictions No   Balance Screen   Has the patient fallen in the past 6 months Yes   How many times? 4  in the last year with 2 broken bones   Has the patient had a decrease in activity level because of a fear of falling?  No   Is the patient reluctant to leave their home because of a fear of falling?  No   Home Environment   Living Environment Private residence   Living Arrangements Alone   Available Help at Discharge Friend(s)   Type of [place] to enter   Entrance Stairs-Number of Steps 7   Entrance Stairs-Rails Right   Home Layout One level   [place] - single point   Prior Function   Level of [place] Retired   Associate Professor   Overall Cognitive Status Within Functional Limits for tasks assessed   Attention Focused       PAIN:Patient has pain if he move it. It is pain free at rest.  POSTURE: WNL   PROM/AROM: WFL  STRENGTH:  Graded on a 0-5 scale Muscle Group Left Right  Shoulder flex    Shoulder Abd    Shoulder Ext    Shoulder IR/ER    Elbow    Wrist/hand    Hip Flex 4/5 4/5  Hip Abd 4/5 4/5  Hip Add 2/5 2/5  Hip Ext 2/5 2/5  Hip IR/ER 4/5 4/5  Knee Flex 5/5 5/5  Knee Ext 5/5 5/5  Ankle DF 5/5 5/5  Ankle PF 5/5 5/5   SENSATION: WNL   FUNCTIONAL MOBILITY: WFL   BALANCE: unable to single  leg stand, unable to tandem stand BLE   GAIT: Ambulates without AD and unsteady path   OUTCOME MEASURES: TEST Outcome Interpretation  5 times sit<>stand 15.55sec >31 yo, >15 sec indicates increased risk for falls  10 meter walk test  .68               m/s <1.0 m/s indicates increased risk for falls; limited community ambulator  Timed up and Go  14.01            sec <14 sec indicates increased risk for falls  6 minute walk test 1275               Feet 1000 feet is community Water quality scientist 46/56 <36/56 (100% risk for falls), 37-45 (80% risk for falls); 46-51 (>50% risk for falls); 52-55 (lower risk <25% of falls)  9 Hole Peg Test L:                R:                           PT Education - Mar 21, 2016 1111    Education provided Yes   Education Details plan of care and goals   Person(s) Educated Patient   Methods Explanation   Comprehension Verbalized understanding             PT Long Term Goals - 03/21/16 1012    PT LONG TERM GOAL #1   Title Patient will be independent in home exercise program to improve strength/mobility for better functional independence with ADLs.   Time 8   Period Weeks   Status New   PT LONG TERM GOAL #2   Title Patient (> 36 years old) will complete five times sit to stand test in < 15 seconds indicating an increased LE strength and improved balance.   Time 8   Period Weeks   Status New   PT LONG TERM GOAL #3   Title Patient will increase Berg Balance score by > 6 points to demonstrate decreased fall risk during functional activities.   Baseline 46/56   Time 8   Period Weeks   Status New   PT LONG TERM GOAL #4   Title Patient will increase 10 meter walk test to >1.70ms as to improve gait speed for better community ambulation and to reduce fall risk.   Time 8   Period Weeks   Status New   PT LONG TERM GOAL #5   Title Patient will be independent with ascend/descend 12 steps using single UE in step over step  pattern without LOB.   Baseline uses 2 hands on one railing at home   Time [place]- [id number]    Clinical Impression Statement Patient has had 4 falls in the past year with 2 fractures following falls. Patient has unsteady gait with decreased outcome measures indicating a falls risk.    Rehab Potential Good   Clinical Impairments Affecting Rehab Potential recent shoulder fracture and wrist,    PT Frequency 2x / week   PT Duration 8 weeks   PT Treatment/Interventions Balance training;Therapeutic exercise;Therapeutic activities;Functional mobility training;Gait training;Neuromuscular re-education   PT Next Visit Plan balance and strength training   PT Home Exercise Plan corner in modified tandem standing    Consulted and Agree with Plan of Care Patient      Patient will benefit from skilled therapeutic intervention in order to improve the following deficits and impairments:  Abnormal gait, Pain, Decreased mobility, Decreased strength, Decreased activity tolerance, Decreased balance, Difficulty walking, Impaired flexibility  Visit Diagnosis: Difficulty in walking, not elsewhere classified  Muscle weakness (generalized)      G-Codes - [id number]    Functional Assessment Tool Used Tug, 5 x sit to stand,  27 MW, 6 MW   Functional Limitation Mobility: Walking and moving around   Mobility: Walking and Moving Around Current Status [phone]) At least 20 percent but less than 40 percent impaired, limited or restricted   Mobility: Walking and Moving Around Goal Status [phone]) At least 1 percent but less than 20 percent impaired, limited or restricted       Problem List Patient Active Problem List   Diagnosis Date Noted  . Disorder of oral mucous membrane 02/10/2015  . CN (constipation) 02/10/2015  . DD (diverticular disease) 02/10/2015  . Accumulation of fluid in tissues 02/10/2015  . Elevated WBC count 02/10/2015  . Fatigue  02/10/2015  . Hemorrhoid 02/10/2015  . Hypoxia 02/10/2015  . Cannot sleep 02/10/2015  . Lichen planus 71/69/6789  . Restless leg 02/10/2015  . 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 Temporary cerebral vascular dysfunction 02/10/2015  . B12 deficiency 02/10/2015  . SOB (shortness of breath) 12/14/2014  . Lower extremity edema 12/14/2014  . COPD (chronic obstructive pulmonary disease) ([place]) 11/14/2014  . Post Inflammatory Lung Changes 11/14/2014  . Confusion state 11/08/2014  . Amnesia 11/08/2014  . PAF (paroxysmal atrial fibrillation) ([employer])   . Hypotension 12/09/2013  . Insomnia 12/09/2013  . OSA on CPAP   . Erectile dysfunction   . Dyspnea on exertion - -essentially resolved with BB dose reduction & wgt loss 03/29/2013  . Overweight (BMI 25.0-29.9) -- Wgt back up 01/17/2013  . Atherosclerotic heart disease of native coronary artery without angina pectoris - s/p CABG, occluded SVG-D1; patent LIMA-LAD, SVG-OM, SVG- RCA   . Essential hypertension   . Hyperlipidemia with target LDL less than 70   . Aorto-iliac disease ([place])   . Basal cell carcinoma of eyelid 01/03/2013  . Carotid artery stenosis 09/09/2012  . ED (erectile dysfunction) of organic origin 11/19/2009  . Allergic rhinitis 08/17/2009  . Arthralgia of ankle or foot 06/04/2009  . Adaptation reaction 05/28/2009  . Arterial vascular disease 05/28/2009  . Benign prostatic hypertrophy without urinary obstruction 05/28/2009  . Acid reflux 05/28/2009  . Arthritis, degenerative 05/28/2009  . Need for prophylactic hormone replacement therapy (postmenopausal) 05/28/2009  . Current tobacco use 05/28/2009  . PAD (peripheral artery disease) - bilateral common iliac stents 12/15/2005   Alanson Puls, PT, DPT [place], Minette Headland S 03/20/2016, 11:13 AM  [place] MAIN [facility] SERVICES [address] [place], [place], [address] Phone: [phone]   Fax:  [phone]  Name: [name] MRN: [id number] Date of Birth: Dec 08, 1933

## 2016-03-20 NOTE — Assessment & Plan Note (Signed)
No claudication. Followed by Dr. Oneida Alar from vascular surgery.

## 2016-03-21 ENCOUNTER — Other Ambulatory Visit: Payer: Self-pay | Admitting: Family Medicine

## 2016-03-21 DIAGNOSIS — E785 Hyperlipidemia, unspecified: Secondary | ICD-10-CM

## 2016-03-24 DIAGNOSIS — M7581 Other shoulder lesions, right shoulder: Secondary | ICD-10-CM | POA: Diagnosis not present

## 2016-03-25 ENCOUNTER — Ambulatory Visit: Payer: Medicare Other | Admitting: Physical Therapy

## 2016-03-25 ENCOUNTER — Encounter: Payer: Self-pay | Admitting: Physical Therapy

## 2016-03-25 DIAGNOSIS — R262 Difficulty in walking, not elsewhere classified: Secondary | ICD-10-CM | POA: Diagnosis not present

## 2016-03-25 DIAGNOSIS — M6281 Muscle weakness (generalized): Secondary | ICD-10-CM | POA: Diagnosis not present

## 2016-03-25 NOTE — Therapy (Signed)
[place] MAIN [facility] SERVICES [address] [place], [place], [address] Phone: [phone]   Fax:  [phone]  Physical Therapy Treatment  Patient Details  Name: [name] MRN: [id number] Date of Birth: 1933/12/31 Referring Provider: Duanne Guess  Encounter Date: 03/25/2016    Past Medical History:  Diagnosis Date  . 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  . Allergy   . Aortoiliac occlusive disease [facility]) April 2007   Status post bilateral common iliac artery stenting  . Atherosclerotic heart disease of artery bypass graft 1999   a) Cardiac Cath : Occluded SVG-D1;; ostial LM 80-90%, diffuse LAD dz w/ D1 &2 subtotalled, LCx 70% prox with competitive flow from SVG-OM, RCA 100% occluded; patent LIMA-LAD, LC-RCA and SVG-OM; b) TM Myoview 10/2010: Ex 6.5 min - 9 METS, HR to 149, No Ischemia or infarct, PVCs; c) Echo 10/2010: EF >55%, mild AoSclerosis, Conc Remodelling.; Myoview 2/ 2016: EF 55%. Low   risk. No ischemia.  . Atherosclerotic heart disease of native coronary artery without angina pectoris 1998   Multivessel disease on cath for angina: S/p CABG x 4 (LIMA-LAD, SVG-RCA, SVG-OM, SVG-DIAG); SVG-Diag occluded on cath 1999  . Cancer [facility])    Skin cancer  . Carotid artery occlusion without infarction 06/2006   a) High Grade L Carotid Stenosis on Doppler -> s/x L CEA; b) Carotid Doppler 10/2013:: Left CEA patent, R. ICA 60-70% stenosis  . Colon polyps   . COPD (chronic obstructive pulmonary disease) ([place])   . Erectile dysfunction due to arterial insufficiency   . Essential hypertension   . GERD (gastroesophageal reflux disease)   . Hyperlipidemia with target LDL less than 70   . Insomnia   . Ischemic heart disease with chronotropic incompetence    Seen on  CPET to evaluate Exertional Dyspnea. Only able to achieve 80% of peak heart rate; peak VO2 was 75% - but possibly underestimated.  . Lichen planus   . Memory loss   . OSA on CPAP   . Osteoporosis   . PAF (paroxysmal atrial fibrillation) ([place]) February 2016   CHA2DS2-VASc = 4. Anticoagulated with Eliquis. On combination beta blocker and calcium channel blocker for rate control  . Sciatic pain    Chronic  . TIA (transient ischemic attack)     Past Surgical History:  Procedure Laterality Date  . ANKLE SURGERY    . Arch aortogram and carotid aortogram  06/02/2006   Dr Oneida Alar did surgery  . CARDIAC CATHETERIZATION  01/30/1998     total native LAD andRCA ,mod native CIRC; svg to RCA and OBTUSE MARG. PATENT the left internal mammary artery graft to LAD ; LV NORMAL  /  . CAROTID ENDARTERECTOMY Left Oct. 29, 2007   Dr. Oneida Alar:  . CORONARY ARTERY BYPASS GRAFT  1998   LIMA-LAD, SVG-OM, SVG-RPDA, SVG-DIAG  . CPET / MET  12/2012   Mild chronotropic incompetence - read 82% of predicted; also reduced effort; peak VO2 15.7 / 75% (did not reach Max effort) -- suggested ischemic response in last 1.5 minutes of exercise. Normal pulmonary function on PFTs but poor response to  . DOPPLER ECHOCARDIOGRAPHY  11/20/2010   EF =>55%; LV norm mild aortic scelorosis  . FRACTURE SURGERY Right 03/19/2015   wrist  . HIATAL HERNIA REPAIR    . ILIAC  ARTERY STENT  12/15/2005   PTA and direct stenting rgt and lft common iliac arteries  . NM MYOCAR PERF WALL MOTION  11/11/2010; February 2016   a) TM STRESS----NORMAL PERFUSION ,EF 68%; b) EF 50-55%. Normal LV function. No significant ischemia or infarction.  . ORIF WRIST FRACTURE Right 03/19/2015   Procedure: OPEN REDUCTION INTERNAL FIXATION (ORIF) WRIST FRACTURE;  Surgeon: Hessie Knows, MD;  Location: [facility] ORS;  Service: Orthopedics;  Laterality: Right;  . THORACIC AORTA - CAROTID ANGIOGRAM  October 2007   Dr. 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 TRANSTHORACIC ECHOCARDIOGRAM  February 2016   [facility]: Normal LV function. Dilated left atrium.    There were no vitals filed for this visit.      Subjective Assessment - 03/25/16 1401    Subjective Patient reports having unsteady gait and several falls. Patient had a cortisone shot in his right shoulder yesterday.   Currently in Pain? No/denies     NMR: TM walking 1. 5 m/hr elevation 2 x 5 mins TM walking side stepping . 5 m/ hr x 2 mins left and 2 mins right Matrix fwd/bwd 15 lbs side stepping left and right x 5 reps Standing on 1/2 foam feet apart  Therapeutic exercise: Leg press 75 lbs heel raises x 20 x 3 Quantum leg press 100 lbs x 20 x 3 standing hip abd/extension/ flex  with RTB x 15  side stepping left and right in parallel bars 10 feet x 3 step ups from floor to 6 inch stool x 20 bilateral sit to stand x 10  Min cueing needed to appropriately perform balance tasks with leg, hand, and head position. Patient requiring consistent verbal cueing to correct form. Patient continues to demonstrate some in coordination of movement with select exercises such as rock and reach and stepping backwards. Patient responds well to verbal and tactile cues to correct form and technique.  CGA to SBA for safety with activities.                               PT Education - 03/25/16 1403    Education provided Yes   Education Details HEP   Person(s) Educated Patient   Methods Explanation   Comprehension Verbalized understanding             PT Long Term Goals - 03/20/16 1012      PT LONG TERM GOAL #1   Title Patient will be independent in home exercise program to improve strength/mobility for better functional independence with ADLs.   Time 8   Period Weeks   Status New     PT LONG TERM GOAL #2   Title Patient (> 48 years old) will complete five times sit to stand test in < 15  seconds indicating an increased LE strength and improved balance.   Time 8   Period Weeks   Status New     PT LONG TERM GOAL #3   Title Patient will increase Berg Balance score by > 6 points to demonstrate decreased fall risk during functional activities.   Baseline 46/56   Time 8   Period Weeks   Status New     PT LONG TERM GOAL #4   Title Patient will increase 10 meter walk test to >1.66ms as to improve gait speed for better community ambulation and to reduce fall risk.  Time 8   Period Weeks   Status New     PT LONG TERM GOAL #5   Title Patient will be independent with ascend/descend 12 steps using single UE in step over step pattern without LOB.   Baseline uses 2 hands on one railing at home   Time 8   Period Weeks   Status New               Plan - 03/25/16 1411    Clinical Impression Statement Patient has fallen several times and has unsteady gait and weakness. He is able to perform LE exercises and static and dynamic standing exercises without pain behaviors and was given writtne HEP.    Rehab Potential Good   Clinical Impairments Affecting Rehab Potential recent shoulder fracture and wrist,    PT Frequency 2x / week   PT Duration 8 weeks   PT Treatment/Interventions Balance training;Therapeutic exercise;Therapeutic activities;Functional mobility training;Gait training;Neuromuscular re-education   PT Next Visit Plan balance and strength training   PT Home Exercise Plan corner in modified tandem standing    Consulted and Agree with Plan of Care Patient      Patient will benefit from skilled therapeutic intervention in order to improve the following deficits and impairments:  Abnormal gait, Pain, Decreased mobility, Decreased strength, Decreased activity tolerance, Decreased balance, Difficulty walking, Impaired flexibility  Visit Diagnosis: Difficulty in walking, not elsewhere classified  Muscle weakness (generalized)     Problem List Patient Active  Problem List   Diagnosis Date Noted  . Disorder of oral mucous membrane 02/10/2015  . CN (constipation) 02/10/2015  . DD (diverticular disease) 02/10/2015  . Accumulation of fluid in tissues 02/10/2015  . Elevated WBC count 02/10/2015  . Fatigue 02/10/2015  . Hemorrhoid 02/10/2015  . Hypoxia 02/10/2015  . Cannot sleep 02/10/2015  . Lichen planus 83/41/9622  . Restless leg 02/10/2015  . 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 Temporary cerebral vascular dysfunction 02/10/2015  . B12 deficiency 02/10/2015  . SOB (shortness of breath) 12/14/2014  . Lower extremity edema 12/14/2014  . COPD (chronic obstructive pulmonary disease) ([place]) 11/14/2014  . Post Inflammatory Lung Changes 11/14/2014  . Confusion state 11/08/2014  . Amnesia 11/08/2014  . PAF (paroxysmal atrial fibrillation) ([facility]) CHA2DS2-VASc = 4. AC = Eliquis   . Hypotension 12/09/2013  . Insomnia 12/09/2013  . OSA on CPAP   . Erectile dysfunction   . Dyspnea on exertion - -essentially resolved with BB dose reduction & wgt loss 03/29/2013  . Overweight (BMI 25.0-29.9) -- Wgt back up 01/17/2013  . Atherosclerotic heart disease of native coronary artery without angina pectoris - s/p CABG, occluded SVG-D1; patent LIMA-LAD, SVG-OM, SVG- RCA   . Essential hypertension   . Hyperlipidemia with target LDL less than 70   . Aorto-iliac disease ([place])   . Basal cell carcinoma of eyelid 01/03/2013  . Carotid artery stenosis 09/09/2012  . ED (erectile dysfunction) of organic origin 11/19/2009  . Allergic rhinitis 08/17/2009  . Arthralgia of ankle or foot 06/04/2009  . Adaptation reaction 05/28/2009  . Arterial vascular disease 05/28/2009  . Benign prostatic hypertrophy without urinary obstruction 05/28/2009  . Acid reflux 05/28/2009  . Arthritis, degenerative 05/28/2009  . Need for prophylactic hormone replacement therapy (postmenopausal) 05/28/2009  . Current tobacco use 05/28/2009  . PAD (peripheral artery disease) - bilateral  common iliac stents 12/15/2005   Alanson Puls, PT, DPT [place], Minette Headland S 03/25/2016, 2:26 PM  [place] MAIN [facility] SERVICES [phone]  [place], [place], [address] Phone: [phone]   Fax:  [phone]  Name: [name] MRN: [id number] Date of Birth: 09-Jul-1934

## 2016-03-25 NOTE — Patient Instructions (Addendum)
Heel Raise, Standing    Stand with abdomen against ball, facing wall. Rise up on toes and return. Do ___ sets of ___ repetitions.  Copyright  VHI. All rights reserved.  EXTENSION: Standing - Resistance Band (Active)    Stand, both feet flat. Against yellow resistance band, draw right leg behind body as far as possible. Complete ___ sets of ___ repetitions. Perform ___ sessions per day.  http://gtsc.exer.us/82   Copyright  VHI. All rights reserved.  FUNCTIONAL MOBILITY: Cross Over Step    Walk sideways, cross one foot in front of the other, then behind the other. Continue. Repeat in opposite direction. ___ reps per set, ___ sets per day, ___ days per week  Copyright  VHI. All rights reserved.  Hip Abduction: Standing - Straight Leg    In shoulder width stance, tubing around ankles, pull leg out to side, keeping knee straight. Repeat __ times per set. Repeat with other leg. Do __ sets per session. Do __ sessions per week.  http://tub.exer.us/207   Copyright  VHI. All rights reserved.

## 2016-03-27 ENCOUNTER — Ambulatory Visit: Payer: Medicare Other | Admitting: Physical Therapy

## 2016-03-27 ENCOUNTER — Encounter: Payer: Self-pay | Admitting: Physical Therapy

## 2016-03-27 DIAGNOSIS — R262 Difficulty in walking, not elsewhere classified: Secondary | ICD-10-CM

## 2016-03-27 DIAGNOSIS — M6281 Muscle weakness (generalized): Secondary | ICD-10-CM | POA: Diagnosis not present

## 2016-03-27 NOTE — Therapy (Signed)
[place] MAIN [facility] SERVICES [address] [place], [place], [address] Phone: [phone]   Fax:  [phone]  Physical Therapy Treatment  Patient Details  Name: [name] MRN: [id number] Date of Birth: May 17, 1934 Referring Provider: Duanne Guess  Encounter Date: 03/27/2016      PT End of Session - 03/27/16 1407    Visit Number 3   Number of Visits 17   Date for PT Re-Evaluation 05/15/16   Authorization Type Gcode 3    PT Start Time 1303   PT Stop Time 1345   PT Time Calculation (min) 42 min   Equipment Utilized During Treatment Gait belt   Activity Tolerance Patient tolerated treatment well   Behavior During Therapy [facility] for tasks assessed/performed      Past Medical History:  Diagnosis Date  . 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  . Allergy   . Aortoiliac occlusive disease [facility]) April 2007   Status post bilateral common iliac artery stenting  . Atherosclerotic heart disease of artery bypass graft 1999   a) Cardiac Cath : Occluded SVG-D1;; ostial LM 80-90%, diffuse LAD dz w/ D1 &2 subtotalled, LCx 70% prox with competitive flow from SVG-OM, RCA 100% occluded; patent LIMA-LAD, LC-RCA and SVG-OM; b) TM Myoview 10/2010: Ex 6.5 min - 9 METS, HR to 149, No Ischemia or infarct, PVCs; c) Echo 10/2010: EF >55%, mild AoSclerosis, Conc Remodelling.; Myoview 2/ 2016: EF 55%. Low   risk. No ischemia.  . Atherosclerotic heart disease of native coronary artery without angina pectoris 1998   Multivessel disease on cath for angina: S/p CABG x 4 (LIMA-LAD, SVG-RCA, SVG-OM, SVG-DIAG); SVG-Diag occluded on cath 1999  . Cancer [facility])    Skin cancer  . Carotid artery occlusion without infarction 06/2006   a) High Grade L Carotid Stenosis on Doppler -> s/x L CEA; b)  Carotid Doppler 10/2013:: Left CEA patent, R. ICA 60-70% stenosis  . Colon polyps   . COPD (chronic obstructive pulmonary disease) ([place])   . Erectile dysfunction due to arterial insufficiency   . Essential hypertension   . GERD (gastroesophageal reflux disease)   . Hyperlipidemia with target LDL less than 70   . Insomnia   . Ischemic heart disease with chronotropic incompetence    Seen on CPET to evaluate Exertional Dyspnea. Only able to achieve 80% of peak heart rate; peak VO2 was 75% - but possibly underestimated.  . Lichen planus   . Memory loss   . OSA on CPAP   . Osteoporosis   . PAF (paroxysmal atrial fibrillation) ([place]) February 2016   CHA2DS2-VASc = 4. Anticoagulated with Eliquis. On combination beta blocker and calcium channel blocker for rate control  . Sciatic pain    Chronic  . TIA (transient ischemic attack)     Past Surgical History:  Procedure Laterality Date  . ANKLE SURGERY    . Arch aortogram and carotid aortogram  06/02/2006   Dr Oneida Alar did surgery  . CARDIAC CATHETERIZATION  01/30/1998     total native LAD andRCA ,mod native CIRC; svg to RCA and OBTUSE MARG. PATENT the left internal mammary artery graft to LAD ; LV NORMAL  /  . CAROTID ENDARTERECTOMY Left Oct. 29, 2007   Dr. Oneida Alar:  . CORONARY ARTERY BYPASS GRAFT  1998   LIMA-LAD, SVG-OM,  SVG-RPDA, SVG-DIAG  . CPET / MET  12/2012   Mild chronotropic incompetence - read 82% of predicted; also reduced effort; peak VO2 15.7 / 75% (did not reach Max effort) -- suggested ischemic response in last 1.5 minutes of exercise. Normal pulmonary function on PFTs but poor response to  . DOPPLER ECHOCARDIOGRAPHY  11/20/2010   EF =>55%; LV norm mild aortic scelorosis  . FRACTURE SURGERY Right 03/19/2015   wrist  . HIATAL HERNIA REPAIR    . ILIAC ARTERY STENT  12/15/2005   PTA and direct stenting rgt and lft common iliac arteries  . NM MYOCAR PERF WALL MOTION  11/11/2010; February 2016   a) TM STRESS----NORMAL PERFUSION ,EF  68%; b) EF 50-55%. Normal LV function. No significant ischemia or infarction.  . ORIF WRIST FRACTURE Right 03/19/2015   Procedure: OPEN REDUCTION INTERNAL FIXATION (ORIF) WRIST FRACTURE;  Surgeon: Hessie Knows, MD;  Location: [facility] ORS;  Service: Orthopedics;  Laterality: Right;  . THORACIC AORTA - CAROTID ANGIOGRAM  October 2007   Dr. 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 TRANSTHORACIC ECHOCARDIOGRAM  February 2016   [facility]: Normal LV function. Dilated left atrium.    There were no vitals filed for this visit.      Subjective Assessment - 03/27/16 1307    Subjective Pt reports that he has been working out at the senior center every day, riding the bike for 20 minutes and doing different machines.     Currently in Pain? No/denies       Treatment Quantum leg press #120, 2 sets x 10 reps, min VCs for eccentric control  Quantum leg press #90 calf raises, 2 sets x 10 reps, min VCs to keep knees extended throughout  Supine bridges, 2 sets x 10 reps, min VCs to increase glut activation and proper breathing technique  Sidelying clamshells, 2 sets x 10 reps on both sides, min tactile cues to keep feet together and increase ROM  Resisted walking #7.5 forwards/backwards, sidestepping in both directions, 2 laps in all directions, CGA for safety and min VCs to lean in opposite direction direction of walking  Sit to stands x 10 without use of UEs, min VCs for upright posture  Sidestepping on airex balance beam without use of UEs for support, CGA for safety x 3 laps  Heel toe walking on airex balance beam with 1HHA, pt demonstrated most difficulty with heel toe walking with one LOB corrected by PT x 3 laps Ball toss against wall while standing on blue airex pad x 10 with feet together, x 10 tandem stance, x 10 with front foot on yellow dyna disc and back foot on blue airex pad, CGA for safety, pt demonstrated most  difficulty standing tandem with L foot in front, no LOB                              PT Education - 03/27/16 1352    Education provided Yes   Education Details working at [employer] on days when he doesnt have therapy, turning lights on at night if the gets up to use bathroom    Person(s) Educated Patient   Methods Explanation;Demonstration;Verbal cues   Comprehension Verbalized understanding;Returned demonstration;Verbal cues required             PT Long Term Goals - 03/20/16 1012      PT LONG TERM GOAL #  1   Title Patient will be independent in home exercise program to improve strength/mobility for better functional independence with ADLs.   Time 8   Period Weeks   Status New     PT LONG TERM GOAL #2   Title Patient (> 68 years old) will complete five times sit to stand test in < 15 seconds indicating an increased LE strength and improved balance.   Time 8   Period Weeks   Status New     PT LONG TERM GOAL #3   Title Patient will increase Berg Balance score by > 6 points to demonstrate decreased fall risk during functional activities.   Baseline 46/56   Time 8   Period Weeks   Status New     PT LONG TERM GOAL #4   Title Patient will increase 10 meter walk test to >1.96ms as to improve gait speed for better community ambulation and to reduce fall risk.   Time 8   Period Weeks   Status New     PT LONG TERM GOAL #5   Title Patient will be independent with ascend/descend 12 steps using single UE in step over step pattern without LOB.   Baseline uses 2 hands on one railing at home   Time 8   Period Weeks   Status New               Plan - 03/27/16 1353    Clinical Impression Statement Pt demonstrated progress today in his LE strength and dynamic balance.  He was able to do leg press #120 and calf raises with #90 with min VCs for eccentric control.  Hip strengthening was advanced by adding supine bridges and clamshells.  Pt demonstrated good  control and no LOB with resisted walking in all directions.  Pts dynamic balance was challenged by performing heel toe walking on airex balance beam.  Pt was advised to continue working out at the senior center but preferbly on days that he does not have physical therapy.  He would continue to benefit from skilled physical therapy to address LE strength and dynamic balance to decrease falls risk.     Rehab Potential Good   Clinical Impairments Affecting Rehab Potential recent shoulder fracture and wrist,    PT Frequency 2x / week   PT Duration 8 weeks   PT Treatment/Interventions Balance training;Therapeutic exercise;Therapeutic activities;Functional mobility training;Gait training;Neuromuscular re-education   PT Next Visit Plan balance and strength training   PT Home Exercise Plan corner in modified tandem standing    Consulted and Agree with Plan of Care Patient      Patient will benefit from skilled therapeutic intervention in order to improve the following deficits and impairments:  Abnormal gait, Pain, Decreased mobility, Decreased strength, Decreased activity tolerance, Decreased balance, Difficulty walking, Impaired flexibility  Visit Diagnosis: Difficulty in walking, not elsewhere classified  Muscle weakness (generalized)     Problem List Patient Active Problem List   Diagnosis Date Noted  . Disorder of oral mucous membrane 02/10/2015  . CN (constipation) 02/10/2015  . DD (diverticular disease) 02/10/2015  . Accumulation of fluid in tissues 02/10/2015  . Elevated WBC count 02/10/2015  . Fatigue 02/10/2015  . Hemorrhoid 02/10/2015  . Hypoxia 02/10/2015  . Cannot sleep 02/10/2015  . Lichen planus 017/49/4496 . Restless leg 02/10/2015  . 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 Temporary cerebral vascular dysfunction 02/10/2015  . B12 deficiency 02/10/2015  . SOB (shortness of breath) 12/14/2014  . Lower extremity  edema 12/14/2014  . COPD (chronic obstructive pulmonary  disease) ([place]) 11/14/2014  . Post Inflammatory Lung Changes 11/14/2014  . Confusion state 11/08/2014  . Amnesia 11/08/2014  . PAF (paroxysmal atrial fibrillation) (HCC) CHA2DS2-VASc = 4. AC = Eliquis   . Hypotension 12/09/2013  . Insomnia 12/09/2013  . OSA on CPAP   . Erectile dysfunction   . Dyspnea on exertion - -essentially resolved with BB dose reduction & wgt loss 03/29/2013  . Overweight (BMI 25.0-29.9) -- Wgt back up 01/17/2013  . Atherosclerotic heart disease of native coronary artery without angina pectoris - s/p CABG, occluded SVG-D1; patent LIMA-LAD, SVG-OM, SVG- RCA   . Essential hypertension   . Hyperlipidemia with target LDL less than 70   . Aorto-iliac disease ([place])   . Basal cell carcinoma of eyelid 01/03/2013  . Carotid artery stenosis 09/09/2012  . ED (erectile dysfunction) of organic origin 11/19/2009  . Allergic rhinitis 08/17/2009  . Arthralgia of ankle or foot 06/04/2009  . Adaptation reaction 05/28/2009  . Arterial vascular disease 05/28/2009  . Benign prostatic hypertrophy without urinary obstruction 05/28/2009  . Acid reflux 05/28/2009  . Arthritis, degenerative 05/28/2009  . Need for prophylactic hormone replacement therapy (postmenopausal) 05/28/2009  . Current tobacco use 05/28/2009  . PAD (peripheral artery disease) - bilateral common iliac stents 12/15/2005   Stacy Gardner, SPT  This entire session was performed under direct supervision and direction of a licensed therapist/therapist assistant . I have personally read, edited and approve of the note as written.  Trotter,Margaret PT, DPT 03/28/2016, 8:33 AM  [place] MAIN [facility] SERVICES [address] [place], [place], [address] Phone: [phone]   Fax:  [phone]  Name: [name] MRN: [id number] Date of Birth: 12/13/1933

## 2016-03-31 ENCOUNTER — Encounter: Payer: Self-pay | Admitting: Physical Therapy

## 2016-03-31 ENCOUNTER — Ambulatory Visit: Payer: Medicare Other | Admitting: Physical Therapy

## 2016-03-31 DIAGNOSIS — R262 Difficulty in walking, not elsewhere classified: Secondary | ICD-10-CM

## 2016-03-31 DIAGNOSIS — M6281 Muscle weakness (generalized): Secondary | ICD-10-CM | POA: Diagnosis not present

## 2016-03-31 NOTE — Therapy (Signed)
[place] MAIN [facility] SERVICES [address] [place], [place], [address] Phone: [phone]   Fax:  [phone]  Physical Therapy Treatment  Patient Details  Name: [name] MRN: [id number] Date of Birth: 01-10-1934 Referring Provider: Duanne Guess  Encounter Date: 03/31/2016      PT End of Session - 03/31/16 0851    Visit Number 4   Number of Visits 17   Date for PT Re-Evaluation 05/15/16   Authorization Type Gcode 3    PT Start Time 0830   PT Stop Time 0914   PT Time Calculation (min) 44 min   Equipment Utilized During Treatment Gait belt   Activity Tolerance Patient tolerated treatment well   Behavior During Therapy [facility] for tasks assessed/performed      Past Medical History:  Diagnosis Date  . 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  . Allergy   . Aortoiliac occlusive disease [facility]) April 2007   Status post bilateral common iliac artery stenting  . Atherosclerotic heart disease of artery bypass graft 1999   a) Cardiac Cath : Occluded SVG-D1;; ostial LM 80-90%, diffuse LAD dz w/ D1 &2 subtotalled, LCx 70% prox with competitive flow from SVG-OM, RCA 100% occluded; patent LIMA-LAD, LC-RCA and SVG-OM; b) TM Myoview 10/2010: Ex 6.5 min - 9 METS, HR to 149, No Ischemia or infarct, PVCs; c) Echo 10/2010: EF >55%, mild AoSclerosis, Conc Remodelling.; Myoview 2/ 2016: EF 55%. Low   risk. No ischemia.  . Atherosclerotic heart disease of native coronary artery without angina pectoris 1998   Multivessel disease on cath for angina: S/p CABG x 4 (LIMA-LAD, SVG-RCA, SVG-OM, SVG-DIAG); SVG-Diag occluded on cath 1999  . Cancer [facility])    Skin cancer  . Carotid artery occlusion without infarction 06/2006   a) High Grade L Carotid Stenosis on Doppler -> s/x L CEA; b)  Carotid Doppler 10/2013:: Left CEA patent, R. ICA 60-70% stenosis  . Colon polyps   . COPD (chronic obstructive pulmonary disease) ([place])   . Erectile dysfunction due to arterial insufficiency   . Essential hypertension   . GERD (gastroesophageal reflux disease)   . Hyperlipidemia with target LDL less than 70   . Insomnia   . Ischemic heart disease with chronotropic incompetence    Seen on CPET to evaluate Exertional Dyspnea. Only able to achieve 80% of peak heart rate; peak VO2 was 75% - but possibly underestimated.  . Lichen planus   . Memory loss   . OSA on CPAP   . Osteoporosis   . PAF (paroxysmal atrial fibrillation) ([place]) February 2016   CHA2DS2-VASc = 4. Anticoagulated with Eliquis. On combination beta blocker and calcium channel blocker for rate control  . Sciatic pain    Chronic  . TIA (transient ischemic attack)     Past Surgical History:  Procedure Laterality Date  . ANKLE SURGERY    . Arch aortogram and carotid aortogram  06/02/2006   Dr Oneida Alar did surgery  . CARDIAC CATHETERIZATION  01/30/1998     total native LAD andRCA ,mod native CIRC; svg to RCA and OBTUSE MARG. PATENT the left internal mammary artery graft to LAD ; LV NORMAL  /  . CAROTID ENDARTERECTOMY Left Oct. 29, 2007   Dr. Oneida Alar:  . CORONARY ARTERY BYPASS GRAFT  1998   LIMA-LAD, SVG-OM,  SVG-RPDA, SVG-DIAG  . CPET / MET  12/2012   Mild chronotropic incompetence - read 82% of predicted; also reduced effort; peak VO2 15.7 / 75% (did not reach Max effort) -- suggested ischemic response in last 1.5 minutes of exercise. Normal pulmonary function on PFTs but poor response to  . DOPPLER ECHOCARDIOGRAPHY  11/20/2010   EF =>55%; LV norm mild aortic scelorosis  . FRACTURE SURGERY Right 03/19/2015   wrist  . HIATAL HERNIA REPAIR    . ILIAC ARTERY STENT  12/15/2005   PTA and direct stenting rgt and lft common iliac arteries  . NM MYOCAR PERF WALL MOTION  11/11/2010; February 2016   a) TM STRESS----NORMAL PERFUSION ,EF  68%; b) EF 50-55%. Normal LV function. No significant ischemia or infarction.  . ORIF WRIST FRACTURE Right 03/19/2015   Procedure: OPEN REDUCTION INTERNAL FIXATION (ORIF) WRIST FRACTURE;  Surgeon: Hessie Knows, MD;  Location: [facility] ORS;  Service: Orthopedics;  Laterality: Right;  . THORACIC AORTA - CAROTID ANGIOGRAM  October 2007   Dr. 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 TRANSTHORACIC ECHOCARDIOGRAM  February 2016   [facility]: Normal LV function. Dilated left atrium.    There were no vitals filed for this visit.      Subjective Assessment - 03/31/16 0833    Subjective Pt reports he is doing well. He has not had any falls recently.    Currently in Pain? No/denies      Therex:  Nustep L3 x3 min (un-billed)  Matrix F/B, B/F, L/R, R/L 18# x3 each direction Standing hip SLR, abd, ext with GTB 2x15 each Sit to stands without UE support 2 x10 Quantum leg press 120 #  20 x 3 Quantum heel raises 90# 20 x3  Mod cues for proper technique of exercises to target specific muscles and slow eccentric control for most effective strengthening.   Gait:  Dynamic gait with ball toss 252f x2 with CGA. Cues for slower pace to increase control and safety.                           PT Education - 03/31/16 0850    Education provided Yes   Education Details heel to toe, increased BOS   Person(s) Educated Patient   Methods Explanation;Demonstration   Comprehension Verbalized understanding;Returned demonstration             PT Long Term Goals - 03/20/16 1012      PT LONG TERM GOAL #1   Title Patient will be independent in home exercise program to improve strength/mobility for better functional independence with ADLs.   Time 8   Period Weeks   Status New     PT LONG TERM GOAL #2   Title Patient (> 661years old) will complete five times sit to stand test in < 15 seconds indicating  an increased LE strength and improved balance.   Time 8   Period Weeks   Status New     PT LONG TERM GOAL #3   Title Patient will increase Berg Balance score by > 6 points to demonstrate decreased fall risk during functional activities.   Baseline 46/56   Time 8   Period Weeks   Status New     PT LONG TERM GOAL #4   Title Patient will increase 10 meter walk test to >1.071m as to improve gait speed for better community ambulation and  to reduce fall risk.   Time 8   Period Weeks   Status New     PT LONG TERM GOAL #5   Title Patient will be independent with ascend/descend 12 steps using single UE in step over step pattern without LOB.   Baseline uses 2 hands on one railing at home   Time 8   Period Weeks   Status New               Plan - 03/31/16 1194    Clinical Impression Statement Pt was able to perform LE strengthening exercises with increased intensity and repetition this session. Hip strength continues to be a primary deficit. With resisted walking pt had most difficulty with stepping L to R. Pt demonstrated difficulty with dynamic gait with dual task/ coordination. He will benefit from continued skilled therapy to progress towards funcitonal goals.   Rehab Potential Good   Clinical Impairments Affecting Rehab Potential recent shoulder fracture and wrist,    PT Frequency 2x / week   PT Duration 8 weeks   PT Treatment/Interventions Balance training;Therapeutic exercise;Therapeutic activities;Functional mobility training;Gait training;Neuromuscular re-education   PT Next Visit Plan balance and strength training   PT Home Exercise Plan corner in modified tandem standing    Consulted and Agree with Plan of Care Patient      Patient will benefit from skilled therapeutic intervention in order to improve the following deficits and impairments:  Abnormal gait, Pain, Decreased mobility, Decreased strength, Decreased activity tolerance, Decreased balance, Difficulty walking,  Impaired flexibility  Visit Diagnosis: Difficulty in walking, not elsewhere classified  Muscle weakness (generalized)     Problem List Patient Active Problem List   Diagnosis Date Noted  . Disorder of oral mucous membrane 02/10/2015  . CN (constipation) 02/10/2015  . DD (diverticular disease) 02/10/2015  . Accumulation of fluid in tissues 02/10/2015  . Elevated WBC count 02/10/2015  . Fatigue 02/10/2015  . Hemorrhoid 02/10/2015  . Hypoxia 02/10/2015  . Cannot sleep 02/10/2015  . Lichen planus 17/40/8144  . Restless leg 02/10/2015  . 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 Temporary cerebral vascular dysfunction 02/10/2015  . B12 deficiency 02/10/2015  . SOB (shortness of breath) 12/14/2014  . Lower extremity edema 12/14/2014  . COPD (chronic obstructive pulmonary disease) ([place]) 11/14/2014  . Post Inflammatory Lung Changes 11/14/2014  . Confusion state 11/08/2014  . Amnesia 11/08/2014  . PAF (paroxysmal atrial fibrillation) (HCC) CHA2DS2-VASc = 4. AC = Eliquis   . Hypotension 12/09/2013  . Insomnia 12/09/2013  . OSA on CPAP   . Erectile dysfunction   . Dyspnea on exertion - -essentially resolved with BB dose reduction & wgt loss 03/29/2013  . Overweight (BMI 25.0-29.9) -- Wgt back up 01/17/2013  . Atherosclerotic heart disease of native coronary artery without angina pectoris - s/p CABG, occluded SVG-D1; patent LIMA-LAD, SVG-OM, SVG- RCA   . Essential hypertension   . Hyperlipidemia with target LDL less than 70   . Aorto-iliac disease ([place])   . Basal cell carcinoma of eyelid 01/03/2013  . Carotid artery stenosis 09/09/2012  . ED (erectile dysfunction) of organic origin 11/19/2009  . Allergic rhinitis 08/17/2009  . Arthralgia of ankle or foot 06/04/2009  . Adaptation reaction 05/28/2009  . Arterial vascular disease 05/28/2009  . Benign prostatic hypertrophy without urinary obstruction 05/28/2009  . Acid reflux 05/28/2009  . Arthritis, degenerative 05/28/2009  .  Need for prophylactic hormone replacement therapy (postmenopausal) 05/28/2009  . Current tobacco use 05/28/2009  . PAD (peripheral artery disease) -  bilateral common iliac stents 12/15/2005    Neoma Laming, PT, DPT  03/31/16, 9:17 AM [place] MAIN [facility] SERVICES [address] [place], [place], [address] Phone: [phone]   Fax:  [phone]  Name: [name] MRN: [id number] Date of Birth: 03/12/1934

## 2016-04-01 HISTORY — PX: BASAL CELL CARCINOMA EXCISION: SHX1214

## 2016-04-02 ENCOUNTER — Ambulatory Visit: Payer: Medicare Other | Attending: Orthopedic Surgery | Admitting: Physical Therapy

## 2016-04-02 ENCOUNTER — Encounter: Payer: Self-pay | Admitting: Physical Therapy

## 2016-04-02 DIAGNOSIS — M6281 Muscle weakness (generalized): Secondary | ICD-10-CM | POA: Diagnosis not present

## 2016-04-02 DIAGNOSIS — R262 Difficulty in walking, not elsewhere classified: Secondary | ICD-10-CM | POA: Diagnosis not present

## 2016-04-02 NOTE — Therapy (Signed)
[place] MAIN [facility] SERVICES [address] [place], [place], [address] Phone: [phone]   Fax:  [phone]  Physical Therapy Treatment  Patient Details  Name: [name] MRN: [id number] Date of Birth: 1933-12-21 Referring Provider: Duanne Guess  Encounter Date: 04/02/2016      PT End of Session - 04/02/16 0828    Visit Number 5   Number of Visits 17   Date for PT Re-Evaluation 05/15/16   Authorization Type Gcode 5   PT Start Time 0825   PT Stop Time 0906   PT Time Calculation (min) 41 min   Equipment Utilized During Treatment Gait belt   Activity Tolerance Patient tolerated treatment well   Behavior During Therapy [facility] for tasks assessed/performed      Past Medical History:  Diagnosis Date  . 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  . Allergy   . Aortoiliac occlusive disease [facility]) April 2007   Status post bilateral common iliac artery stenting  . Atherosclerotic heart disease of artery bypass graft 1999   a) Cardiac Cath : Occluded SVG-D1;; ostial LM 80-90%, diffuse LAD dz w/ D1 &2 subtotalled, LCx 70% prox with competitive flow from SVG-OM, RCA 100% occluded; patent LIMA-LAD, LC-RCA and SVG-OM; b) TM Myoview 10/2010: Ex 6.5 min - 9 METS, HR to 149, No Ischemia or infarct, PVCs; c) Echo 10/2010: EF >55%, mild AoSclerosis, Conc Remodelling.; Myoview 2/ 2016: EF 55%. Low   risk. No ischemia.  . Atherosclerotic heart disease of native coronary artery without angina pectoris 1998   Multivessel disease on cath for angina: S/p CABG x 4 (LIMA-LAD, SVG-RCA, SVG-OM, SVG-DIAG); SVG-Diag occluded on cath 1999  . Cancer [facility])    Skin cancer  . Carotid artery occlusion without infarction 06/2006   a) High Grade L Carotid Stenosis on Doppler -> s/x L CEA; b)  Carotid Doppler 10/2013:: Left CEA patent, R. ICA 60-70% stenosis  . Colon polyps   . COPD (chronic obstructive pulmonary disease) ([place])   . Erectile dysfunction due to arterial insufficiency   . Essential hypertension   . GERD (gastroesophageal reflux disease)   . Hyperlipidemia with target LDL less than 70   . Insomnia   . Ischemic heart disease with chronotropic incompetence    Seen on CPET to evaluate Exertional Dyspnea. Only able to achieve 80% of peak heart rate; peak VO2 was 75% - but possibly underestimated.  . Lichen planus   . Memory loss   . OSA on CPAP   . Osteoporosis   . PAF (paroxysmal atrial fibrillation) ([place]) February 2016   CHA2DS2-VASc = 4. Anticoagulated with Eliquis. On combination beta blocker and calcium channel blocker for rate control  . Sciatic pain    Chronic  . TIA (transient ischemic attack)     Past Surgical History:  Procedure Laterality Date  . ANKLE SURGERY    . Arch aortogram and carotid aortogram  06/02/2006   Dr Oneida Alar did surgery  . CARDIAC CATHETERIZATION  01/30/1998     total native LAD andRCA ,mod native CIRC; svg to RCA and OBTUSE MARG. PATENT the left internal mammary artery graft to LAD ; LV NORMAL  /  . CAROTID ENDARTERECTOMY Left Oct. 29, 2007   Dr. Oneida Alar:  . CORONARY ARTERY BYPASS GRAFT  1998   LIMA-LAD, SVG-OM, SVG-RPDA,  SVG-DIAG  . CPET / MET  12/2012   Mild chronotropic incompetence - read 82% of predicted; also reduced effort; peak VO2 15.7 / 75% (did not reach Max effort) -- suggested ischemic response in last 1.5 minutes of exercise. Normal pulmonary function on PFTs but poor response to  . DOPPLER ECHOCARDIOGRAPHY  11/20/2010   EF =>55%; LV norm mild aortic scelorosis  . FRACTURE SURGERY Right 03/19/2015   wrist  . HIATAL HERNIA REPAIR    . ILIAC ARTERY STENT  12/15/2005   PTA and direct stenting rgt and lft common iliac arteries  . NM MYOCAR PERF WALL MOTION  11/11/2010; February 2016   a) TM STRESS----NORMAL PERFUSION ,EF  68%; b) EF 50-55%. Normal LV function. No significant ischemia or infarction.  . ORIF WRIST FRACTURE Right 03/19/2015   Procedure: OPEN REDUCTION INTERNAL FIXATION (ORIF) WRIST FRACTURE;  Surgeon: Hessie Knows, MD;  Location: [facility] ORS;  Service: Orthopedics;  Laterality: Right;  . THORACIC AORTA - CAROTID ANGIOGRAM  October 2007   Dr. 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 TRANSTHORACIC ECHOCARDIOGRAM  February 2016   [facility]: Normal LV function. Dilated left atrium.    There were no vitals filed for this visit.      Subjective Assessment - 04/02/16 0828    Subjective Pt is doing well. Reports he has had no problems. Mild soreness after last session.   Currently in Pain? No/denies      Therex:   Nustep L3 x3 min (un-billed)   Matrix F/B, B/F, L/R, R/L 18# x3 each direction; most difficulty with L and R Standing hip SLR, abd, ext, and marching with GTB 2x10 each Sit to stands without UE support 2 x10   Mod cues for proper technique of exercises to target specific muscles and slow eccentric control for most effective strengthening.    Neuro re-education:  Toe taps to step standing on Airex x20 Tilt board: F/B, L/R 2x10 without UE support Tandem stand on half foam 3x30 sec without UE support  Required min A and cues for weight shifting to correct LOB.                           PT Education - 04/02/16 0828    Education provided Yes   Education Details DOMS   Person(s) Educated Patient   Methods Explanation   Comprehension Verbalized understanding             PT Long Term Goals - 03/20/16 1012      PT LONG TERM GOAL #1   Title Patient will be independent in home exercise program to improve strength/mobility for better functional independence with ADLs.   Time 8   Period Weeks   Status New     PT LONG TERM GOAL #2   Title Patient (> 80 years old) will  complete five times sit to stand test in < 15 seconds indicating an increased LE strength and improved balance.   Time 8   Period Weeks   Status New     PT LONG TERM GOAL #3   Title Patient will increase Berg Balance score by > 6 points to demonstrate decreased fall risk during functional activities.   Baseline 46/56   Time 8   Period Weeks   Status New     PT LONG TERM GOAL #4   Title Patient will increase 10 meter walk  test to >1.40ms as to improve gait speed for better community ambulation and to reduce fall risk.   Time 8   Period Weeks   Status New     PT LONG TERM GOAL #5   Title Patient will be independent with ascend/descend 12 steps using single UE in step over step pattern without LOB.   Baseline uses 2 hands on one railing at home   Time 8   Period Weeks   Status New               Plan - 04/02/16 0900    Clinical Impression Statement Pt gradually improving in strength and balance. He continues to demonstrate difficulty with L and R stepping or narrow base/ tandem stance activities. Pt required min A during balance tasks and cues for weight shifting to correct LOB. He will benefit from continued strengthening and higher level balance training to further improve towards functional goals.   Rehab Potential Good   Clinical Impairments Affecting Rehab Potential recent shoulder fracture and wrist,    PT Frequency 2x / week   PT Duration 8 weeks   PT Treatment/Interventions Balance training;Therapeutic exercise;Therapeutic activities;Functional mobility training;Gait training;Neuromuscular re-education   PT Next Visit Plan balance and strength training   PT Home Exercise Plan corner in modified tandem standing    Consulted and Agree with Plan of Care Patient      Patient will benefit from skilled therapeutic intervention in order to improve the following deficits and impairments:  Abnormal gait, Pain, Decreased mobility, Decreased strength, Decreased activity  tolerance, Decreased balance, Difficulty walking, Impaired flexibility  Visit Diagnosis: Difficulty in walking, not elsewhere classified  Muscle weakness (generalized)     Problem List Patient Active Problem List   Diagnosis Date Noted  . Disorder of oral mucous membrane 02/10/2015  . CN (constipation) 02/10/2015  . DD (diverticular disease) 02/10/2015  . Accumulation of fluid in tissues 02/10/2015  . Elevated WBC count 02/10/2015  . Fatigue 02/10/2015  . Hemorrhoid 02/10/2015  . Hypoxia 02/10/2015  . Cannot sleep 02/10/2015  . Lichen planus 017/49/4496 . Restless leg 02/10/2015  . 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 Temporary cerebral vascular dysfunction 02/10/2015  . B12 deficiency 02/10/2015  . SOB (shortness of breath) 12/14/2014  . Lower extremity edema 12/14/2014  . COPD (chronic obstructive pulmonary disease) ([place] 11/14/2014  . Post Inflammatory Lung Changes 11/14/2014  . Confusion state 11/08/2014  . Amnesia 11/08/2014  . PAF (paroxysmal atrial fibrillation) (H[facility]) CHA2DS2-VASc = 4. AC = Eliquis   . Hypotension 12/09/2013  . Insomnia 12/09/2013  . OSA on CPAP   . Erectile dysfunction   . Dyspnea on exertion - -essentially resolved with BB dose reduction & wgt loss 03/29/2013  . Overweight (BMI 25.0-29.9) -- Wgt back up 01/17/2013  . Atherosclerotic heart disease of native coronary artery without angina pectoris - s/p CABG, occluded SVG-D1; patent LIMA-LAD, SVG-OM, SVG- RCA   . Essential hypertension   . Hyperlipidemia with target LDL less than 70   . Aorto-iliac disease ([place]   . Basal cell carcinoma of eyelid 01/03/2013  . Carotid artery stenosis 09/09/2012  . ED (erectile dysfunction) of organic origin 11/19/2009  . Allergic rhinitis 08/17/2009  . Arthralgia of ankle or foot 06/04/2009  . Adaptation reaction 05/28/2009  . Arterial vascular disease 05/28/2009  . Benign prostatic hypertrophy without urinary obstruction 05/28/2009  . Acid reflux  05/28/2009  . Arthritis, degenerative 05/28/2009  . Need for prophylactic hormone replacement therapy (postmenopausal) 05/28/2009  .  Current tobacco use 05/28/2009  . PAD (peripheral artery disease) - bilateral common iliac stents 12/15/2005    Neoma Laming, PT, DPT  04/02/16, 9:08 AM [phone]  [place] MAIN [facility] SERVICES [address] [place], [place], [address] Phone: [phone]   Fax:  [phone]  Name: [name] MRN: [id number] Date of Birth: [birth year]

## 2016-04-08 ENCOUNTER — Encounter: Payer: Self-pay | Admitting: Physical Therapy

## 2016-04-08 ENCOUNTER — Ambulatory Visit: Payer: Medicare Other | Admitting: Physical Therapy

## 2016-04-08 DIAGNOSIS — M6281 Muscle weakness (generalized): Secondary | ICD-10-CM | POA: Diagnosis not present

## 2016-04-08 DIAGNOSIS — R262 Difficulty in walking, not elsewhere classified: Secondary | ICD-10-CM | POA: Diagnosis not present

## 2016-04-08 NOTE — Therapy (Signed)
[place] MAIN [facility] SERVICES [address] [place], [place], [address] Phone: [phone]   Fax:  [phone]  Physical Therapy Treatment  Patient Details  Name: [name] MRN: [id number] Date of Birth: April 23, 1934 Referring Provider: Duanne Guess  Encounter Date: 04/08/2016      PT End of Session - 04/08/16 0835    Visit Number 6   Number of Visits 17   Date for PT Re-Evaluation 05/15/16   Authorization Type Gcode 6   PT Start Time 0830   PT Stop Time 0910   PT Time Calculation (min) 40 min   Equipment Utilized During Treatment Gait belt   Activity Tolerance Patient tolerated treatment well   Behavior During Therapy [facility] for tasks assessed/performed      Past Medical History:  Diagnosis Date  . 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  . Allergy   . Aortoiliac occlusive disease [facility]) April 2007   Status post bilateral common iliac artery stenting  . Atherosclerotic heart disease of artery bypass graft 1999   a) Cardiac Cath : Occluded SVG-D1;; ostial LM 80-90%, diffuse LAD dz w/ D1 &2 subtotalled, LCx 70% prox with competitive flow from SVG-OM, RCA 100% occluded; patent LIMA-LAD, LC-RCA and SVG-OM; b) TM Myoview 10/2010: Ex 6.5 min - 9 METS, HR to 149, No Ischemia or infarct, PVCs; c) Echo 10/2010: EF >55%, mild AoSclerosis, Conc Remodelling.; Myoview 2/ 2016: EF 55%. Low   risk. No ischemia.  . Atherosclerotic heart disease of native coronary artery without angina pectoris 1998   Multivessel disease on cath for angina: S/p CABG x 4 (LIMA-LAD, SVG-RCA, SVG-OM, SVG-DIAG); SVG-Diag occluded on cath 1999  . Cancer [facility])    Skin cancer  . Carotid artery occlusion without infarction 06/2006   a) High Grade L Carotid Stenosis on Doppler -> s/x L CEA; b)  Carotid Doppler 10/2013:: Left CEA patent, R. ICA 60-70% stenosis  . Colon polyps   . COPD (chronic obstructive pulmonary disease) ([place])   . Erectile dysfunction due to arterial insufficiency   . Essential hypertension   . GERD (gastroesophageal reflux disease)   . Hyperlipidemia with target LDL less than 70   . Insomnia   . Ischemic heart disease with chronotropic incompetence    Seen on CPET to evaluate Exertional Dyspnea. Only able to achieve 80% of peak heart rate; peak VO2 was 75% - but possibly underestimated.  . Lichen planus   . Memory loss   . OSA on CPAP   . Osteoporosis   . PAF (paroxysmal atrial fibrillation) ([place]) February 2016   CHA2DS2-VASc = 4. Anticoagulated with Eliquis. On combination beta blocker and calcium channel blocker for rate control  . Sciatic pain    Chronic  . TIA (transient ischemic attack)     Past Surgical History:  Procedure Laterality Date  . ANKLE SURGERY    . Arch aortogram and carotid aortogram  06/02/2006   Dr Oneida Alar did surgery  . CARDIAC CATHETERIZATION  01/30/1998     total native LAD andRCA ,mod native CIRC; svg to RCA and OBTUSE MARG. PATENT the left internal mammary artery graft to LAD ; LV NORMAL  /  . CAROTID ENDARTERECTOMY Left Oct. 29, 2007   Dr. Oneida Alar:  . CORONARY ARTERY BYPASS GRAFT  1998   LIMA-LAD, SVG-OM, SVG-RPDA,  SVG-DIAG  . CPET / MET  12/2012   Mild chronotropic incompetence - read 82% of predicted; also reduced effort; peak VO2 15.7 / 75% (did not reach Max effort) -- suggested ischemic response in last 1.5 minutes of exercise. Normal pulmonary function on PFTs but poor response to  . DOPPLER ECHOCARDIOGRAPHY  11/20/2010   EF =>55%; LV norm mild aortic scelorosis  . FRACTURE SURGERY Right 03/19/2015   wrist  . HIATAL HERNIA REPAIR    . ILIAC ARTERY STENT  12/15/2005   PTA and direct stenting rgt and lft common iliac arteries  . NM MYOCAR PERF WALL MOTION  11/11/2010; February 2016   a) TM STRESS----NORMAL PERFUSION ,EF  68%; b) EF 50-55%. Normal LV function. No significant ischemia or infarction.  . ORIF WRIST FRACTURE Right 03/19/2015   Procedure: OPEN REDUCTION INTERNAL FIXATION (ORIF) WRIST FRACTURE;  Surgeon: Hessie Knows, MD;  Location: [facility] ORS;  Service: Orthopedics;  Laterality: Right;  . THORACIC AORTA - CAROTID ANGIOGRAM  October 2007   Dr. 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 TRANSTHORACIC ECHOCARDIOGRAM  February 2016   [facility]: Normal LV function. Dilated left atrium.    There were no vitals filed for this visit.      Subjective Assessment - 04/08/16 0834    Subjective Pt is doing well. Reports he has had no problems. Mild soreness after last session.   Currently in Pain? No/denies        Neuromuscular Re-education   Feet together  on blue foam pad x 30 seconds for 2 sets   Tandem standing in // bars  x 1 minute Step ups to blue foam pad x 10 bilaterally for 2 sets bilaterally   Heel raises bilateral feet  Matrix  x 5 reps fwd/bwd/side stepping Standing on 2 disk tandem  Feet together on blue foam and holding ball x 1 minute, holding ball and fwd and bwd movement elbow flex/ext x 20, horizontal abd/add with ball and arms extended. THEREX Resisted walking fwd, retro, bil side stepping with12.5# x 3 laps each; min A throughout due to pt being unsteady and losing balance multiple times throughout each direction; increased time to complete task  Patient has left hip pain and left ankle fatigue and some pain that decreases with seated position. Patient needs cues for improving posture and correct position during exercises.  Fatigue with sit to stand but demonstrating more control, Increase weight for standing exercises. Fatigue still evident with cross trainer and endurance.                          PT Education - 04/08/16 [phone]    Education provided Yes   Education Details  progression of HEP   Person(s) Educated Patient   Methods Explanation   Comprehension Verbalized understanding             PT Long Term Goals - 03/20/16 1012      PT LONG TERM GOAL #1   Title Patient will be independent in home exercise program to improve strength/mobility for better functional independence with ADLs.   Time 8   Period Weeks   Status New     PT LONG TERM GOAL #2   Title Patient (> 25 years old) will complete five times sit to stand test in < 15 seconds indicating an increased LE strength and improved balance.   Time 8  Period Weeks   Status New     PT LONG TERM GOAL #3   Title Patient will increase Berg Balance score by > 6 points to demonstrate decreased fall risk during functional activities.   Baseline 46/56   Time 8   Period Weeks   Status New     PT LONG TERM GOAL #4   Title Patient will increase 10 meter walk test to >1.57ms as to improve gait speed for better community ambulation and to reduce fall risk.   Time 8   Period Weeks   Status New     PT LONG TERM GOAL #5   Title Patient will be independent with ascend/descend 12 steps using single UE in step over step pattern without LOB.   Baseline uses 2 hands on one railing at home   Time 8   Period Weeks   Status New               Plan - 04/08/16 06734   Clinical Impression Statement PT provided min  verbal instruction to improve step length and step height and  proper use of LE, and improved posture and gait mechanics. Patient responded to instruction   Rehab Potential Good   Clinical Impairments Affecting Rehab Potential recent shoulder fracture and wrist,    PT Frequency 2x / week   PT Duration 8 weeks   PT Treatment/Interventions Balance training;Therapeutic exercise;Therapeutic activities;Functional mobility training;Gait training;Neuromuscular re-education   PT Next Visit Plan balance and strength training   PT Home Exercise Plan corner in modified tandem standing     Consulted and Agree with Plan of Care Patient      Patient will benefit from skilled therapeutic intervention in order to improve the following deficits and impairments:  Abnormal gait, Pain, Decreased mobility, Decreased strength, Decreased activity tolerance, Decreased balance, Difficulty walking, Impaired flexibility  Visit Diagnosis: Difficulty in walking, not elsewhere classified  Muscle weakness (generalized)     Problem List Patient Active Problem List   Diagnosis Date Noted  . Disorder of oral mucous membrane 02/10/2015  . CN (constipation) 02/10/2015  . DD (diverticular disease) 02/10/2015  . Accumulation of fluid in tissues 02/10/2015  . Elevated WBC count 02/10/2015  . Fatigue 02/10/2015  . Hemorrhoid 02/10/2015  . Hypoxia 02/10/2015  . Cannot sleep 02/10/2015  . Lichen planus 019/37/9024 . Restless leg 02/10/2015  . 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 Temporary cerebral vascular dysfunction 02/10/2015  . B12 deficiency 02/10/2015  . SOB (shortness of breath) 12/14/2014  . Lower extremity edema 12/14/2014  . COPD (chronic obstructive pulmonary disease) ([place] 11/14/2014  . Post Inflammatory Lung Changes 11/14/2014  . Confusion state 11/08/2014  . Amnesia 11/08/2014  . PAF (paroxysmal atrial fibrillation) (HCC) CHA2DS2-VASc = 4. AC = Eliquis   . Hypotension 12/09/2013  . Insomnia 12/09/2013  . OSA on CPAP   . Erectile dysfunction   . Dyspnea on exertion - -essentially resolved with BB dose reduction & wgt loss 03/29/2013  . Overweight (BMI 25.0-29.9) -- Wgt back up 01/17/2013  . Atherosclerotic heart disease of native coronary artery without angina pectoris - s/p CABG, occluded SVG-D1; patent LIMA-LAD, SVG-OM, SVG- RCA   . Essential hypertension   . Hyperlipidemia with target LDL less than 70   . [place]ac disease ([place]   . Basal cell carcinoma of eyelid 01/03/2013  . Carotid artery stenosis 09/09/2012  . ED (erectile dysfunction) of organic origin  11/19/2009  . Allergic rhinitis 08/17/2009  . Arthralgia of ankle  or foot 06/04/2009  . Adaptation reaction 05/28/2009  . Arterial vascular disease 05/28/2009  . Benign prostatic hypertrophy without urinary obstruction 05/28/2009  . Acid reflux 05/28/2009  . Arthritis, degenerative 05/28/2009  . Need for prophylactic hormone replacement therapy (postmenopausal) 05/28/2009  . Current tobacco use 05/28/2009  . PAD (peripheral artery disease) - bilateral common iliac stents 12/15/2005   Alanson Puls, PT, DPT [place] 04/08/2016, 8:37 AM  [place] MAIN [facility] SERVICES [address] [place], [place], [address] Phone: [phone]   Fax:  [phone]  Name: [name] MRN: [id number] Date of Birth: 02-04-1934

## 2016-04-10 ENCOUNTER — Ambulatory Visit: Payer: Medicare Other | Admitting: Physical Therapy

## 2016-04-10 ENCOUNTER — Encounter: Payer: Self-pay | Admitting: Physical Therapy

## 2016-04-10 DIAGNOSIS — R262 Difficulty in walking, not elsewhere classified: Secondary | ICD-10-CM | POA: Diagnosis not present

## 2016-04-10 DIAGNOSIS — M6281 Muscle weakness (generalized): Secondary | ICD-10-CM | POA: Diagnosis not present

## 2016-04-10 NOTE — Therapy (Signed)
[place] MAIN [facility] SERVICES [address] [place], [place], [address] Phone: [phone]   Fax:  [phone]  Physical Therapy Treatment  Patient Details  Name: [name] MRN: [id number] Date of Birth: 23-Feb-1934 Referring Provider: Duanne Guess  Encounter Date: 04/10/2016      PT End of Session - 04/10/16 0832    Visit Number 7   Number of Visits 17   Date for PT Re-Evaluation 05/15/16   Authorization Type Gcode 7   PT Start Time 0830   PT Stop Time 0910   PT Time Calculation (min) 40 min   Equipment Utilized During Treatment Gait belt   Activity Tolerance Patient tolerated treatment well   Behavior During Therapy [facility] for tasks assessed/performed      Past Medical History:  Diagnosis Date  . 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  . Allergy   . Aortoiliac occlusive disease [facility]) April 2007   Status post bilateral common iliac artery stenting  . Atherosclerotic heart disease of artery bypass graft 1999   a) Cardiac Cath : Occluded SVG-D1;; ostial LM 80-90%, diffuse LAD dz w/ D1 &2 subtotalled, LCx 70% prox with competitive flow from SVG-OM, RCA 100% occluded; patent LIMA-LAD, LC-RCA and SVG-OM; b) TM Myoview 10/2010: Ex 6.5 min - 9 METS, HR to 149, No Ischemia or infarct, PVCs; c) Echo 10/2010: EF >55%, mild AoSclerosis, Conc Remodelling.; Myoview 2/ 2016: EF 55%. Low   risk. No ischemia.  . Atherosclerotic heart disease of native coronary artery without angina pectoris 1998   Multivessel disease on cath for angina: S/p CABG x 4 (LIMA-LAD, SVG-RCA, SVG-OM, SVG-DIAG); SVG-Diag occluded on cath 1999  . Cancer [facility])    Skin cancer  . Carotid artery occlusion without infarction 06/2006   a) High Grade L Carotid Stenosis on Doppler -> s/x L CEA; b)  Carotid Doppler 10/2013:: Left CEA patent, R. ICA 60-70% stenosis  . Colon polyps   . COPD (chronic obstructive pulmonary disease) ([place])   . Erectile dysfunction due to arterial insufficiency   . Essential hypertension   . GERD (gastroesophageal reflux disease)   . Hyperlipidemia with target LDL less than 70   . Insomnia   . Ischemic heart disease with chronotropic incompetence    Seen on CPET to evaluate Exertional Dyspnea. Only able to achieve 80% of peak heart rate; peak VO2 was 75% - but possibly underestimated.  . Lichen planus   . Memory loss   . OSA on CPAP   . Osteoporosis   . PAF (paroxysmal atrial fibrillation) ([place]) February 2016   CHA2DS2-VASc = 4. Anticoagulated with Eliquis. On combination beta blocker and calcium channel blocker for rate control  . Sciatic pain    Chronic  . TIA (transient ischemic attack)     Past Surgical History:  Procedure Laterality Date  . ANKLE SURGERY    . Arch aortogram and carotid aortogram  06/02/2006   Dr Oneida Alar did surgery  . CARDIAC CATHETERIZATION  01/30/1998     total native LAD andRCA ,mod native CIRC; svg to RCA and OBTUSE MARG. PATENT the left internal mammary artery graft to LAD ; LV NORMAL  /  . CAROTID ENDARTERECTOMY Left Oct. 29, 2007   Dr. Oneida Alar:  . CORONARY ARTERY BYPASS GRAFT  1998   LIMA-LAD, SVG-OM, SVG-RPDA,  SVG-DIAG  . CPET / MET  12/2012   Mild chronotropic incompetence - read 82% of predicted; also reduced effort; peak VO2 15.7 / 75% (did not reach Max effort) -- suggested ischemic response in last 1.5 minutes of exercise. Normal pulmonary function on PFTs but poor response to  . DOPPLER ECHOCARDIOGRAPHY  11/20/2010   EF =>55%; LV norm mild aortic scelorosis  . FRACTURE SURGERY Right 03/19/2015   wrist  . HIATAL HERNIA REPAIR    . ILIAC ARTERY STENT  12/15/2005   PTA and direct stenting rgt and lft common iliac arteries  . NM MYOCAR PERF WALL MOTION  11/11/2010; February 2016   a) TM STRESS----NORMAL PERFUSION ,EF  68%; b) EF 50-55%. Normal LV function. No significant ischemia or infarction.  . ORIF WRIST FRACTURE Right 03/19/2015   Procedure: OPEN REDUCTION INTERNAL FIXATION (ORIF) WRIST FRACTURE;  Surgeon: Hessie Knows, MD;  Location: [facility] ORS;  Service: Orthopedics;  Laterality: Right;  . THORACIC AORTA - CAROTID ANGIOGRAM  October 2007   Dr. 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 TRANSTHORACIC ECHOCARDIOGRAM  February 2016   [facility]: Normal LV function. Dilated left atrium.    There were no vitals filed for this visit.      Subjective Assessment - 04/10/16 0831    Subjective Pt is doing well. Reports he has had no problems. Mild soreness after last session.   Currently in Pain? No/denies   Multiple Pain Sites No        NMR BLE staggered stance anterior/posterior weight shifting on small rockerboard; CGA, increased difficulty and more unsteadiness with RLE fwd; min cues to increase weight shift over RLE BLE  NBOS stance on airex pad with no UE , 5x15 sec , Blue foam pad and  tapping to  6 inch stool without UE x 20 x 2 BLE staggered stance with front foot on dynadiscx 5 min; CGA, increased hip flexion to maintain balance  Matrix fwd/ bwd/ side stepping x 5  CGA and Min verbal cues used throughout with increased in postural sway and LOB most seen with narrow base of support and while on uneven surfaces. Continues to have balance deficits typical with diagnosis. Patient performs intermediate level exercises without pain behaviors.                         PT Education - 04/10/16 [phone]    Education provided Yes   Education Details safety with mobility   Person(s) Educated Patient   Methods Explanation   Comprehension Verbalized understanding             PT Long Term Goals - 03/20/16 1012      PT LONG TERM GOAL #1   Title Patient will be independent in home exercise program to  improve strength/mobility for better functional independence with ADLs.   Time 8   Period Weeks   Status New     PT LONG TERM GOAL #2   Title Patient (> 80 years old) will complete five times sit to stand test in < 15 seconds indicating an increased LE strength and improved balance.   Time 8   Period Weeks   Status New     PT LONG TERM GOAL #3   Title Patient will increase Berg Balance score by > 6 points to demonstrate decreased fall risk during functional activities.   Baseline 46/56   Time 8  Period Weeks   Status New     PT LONG TERM GOAL #4   Title Patient will increase 10 meter walk test to >1.62ms as to improve gait speed for better community ambulation and to reduce fall risk.   Time 8   Period Weeks   Status New     PT LONG TERM GOAL #5   Title Patient will be independent with ascend/descend 12 steps using single UE in step over step pattern without LOB.   Baseline uses 2 hands on one railing at home   Time [place]- 04/10/16 08768   Clinical Impression Statement Fatigue with sit to stand but demonstrating more control, Increase weight for standing exercises. Fatigue still evident with cross trainer and endurance.    Rehab Potential Good   Clinical Impairments Affecting Rehab Potential recent shoulder fracture and wrist,    PT Frequency 2x / week   PT Duration 8 weeks   PT Treatment/Interventions Balance training;Therapeutic exercise;Therapeutic activities;Functional mobility training;Gait training;Neuromuscular re-education   PT Next Visit Plan balance and strength training   PT Home Exercise Plan corner in modified tandem standing    Consulted and Agree with Plan of Care Patient      Patient will benefit from skilled therapeutic intervention in order to improve the following deficits and impairments:  Abnormal gait, Pain, Decreased mobility, Decreased strength, Decreased activity tolerance, Decreased balance,  Difficulty walking, Impaired flexibility  Visit Diagnosis: Difficulty in walking, not elsewhere classified  Muscle weakness (generalized)     Problem List Patient Active Problem List   Diagnosis Date Noted  . Disorder of oral mucous membrane 02/10/2015  . CN (constipation) 02/10/2015  . DD (diverticular disease) 02/10/2015  . Accumulation of fluid in tissues 02/10/2015  . Elevated WBC count 02/10/2015  . Fatigue 02/10/2015  . Hemorrhoid 02/10/2015  . Hypoxia 02/10/2015  . Cannot sleep 02/10/2015  . Lichen planus 011/57/2620 . Restless leg 02/10/2015  . 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 Temporary cerebral vascular dysfunction 02/10/2015  . B12 deficiency 02/10/2015  . SOB (shortness of breath) 12/14/2014  . Lower extremity edema 12/14/2014  . COPD (chronic obstructive pulmonary disease) ([place] 11/14/2014  . Post Inflammatory Lung Changes 11/14/2014  . Confusion state 11/08/2014  . Amnesia 11/08/2014  . PAF (paroxysmal atrial fibrillation) (HCC) CHA2DS2-VASc = 4. AC = Eliquis   . Hypotension 12/09/2013  . Insomnia 12/09/2013  . OSA on CPAP   . Erectile dysfunction   . Dyspnea on exertion - -essentially resolved with BB dose reduction & wgt loss 03/29/2013  . Overweight (BMI 25.0-29.9) -- Wgt back up 01/17/2013  . Atherosclerotic heart disease of native coronary artery without angina pectoris - s/p CABG, occluded SVG-D1; patent LIMA-LAD, SVG-OM, SVG- RCA   . Essential hypertension   . Hyperlipidemia with target LDL less than 70   . Aorto-iliac disease ([place]   . Basal cell carcinoma of eyelid 01/03/2013  . Carotid artery stenosis 09/09/2012  . ED (erectile dysfunction) of organic origin 11/19/2009  . Allergic rhinitis 08/17/2009  . Arthralgia of ankle or foot 06/04/2009  . Adaptation reaction 05/28/2009  . Arterial vascular disease 05/28/2009  . Benign prostatic hypertrophy without urinary obstruction 05/28/2009  . Acid reflux 05/28/2009  . Arthritis,  degenerative 05/28/2009  . Need for prophylactic hormone replacement therapy (postmenopausal) 05/28/2009  . Current tobacco use 05/28/2009  . PAD (  peripheral artery disease) - bilateral common iliac stents 12/15/2005   Alanson Puls, PT, DPT [place] S 04/10/2016, 8:34 AM  [place] MAIN [facility] SERVICES [address] [place], [place], [address] Phone: [phone]   Fax:  [phone]  Name: [name] MRN: [id number] Date of Birth: Nov 21, 1933

## 2016-04-15 ENCOUNTER — Encounter: Payer: Self-pay | Admitting: Physical Therapy

## 2016-04-15 ENCOUNTER — Ambulatory Visit: Payer: Medicare Other | Admitting: Physical Therapy

## 2016-04-15 DIAGNOSIS — M6281 Muscle weakness (generalized): Secondary | ICD-10-CM

## 2016-04-15 DIAGNOSIS — R262 Difficulty in walking, not elsewhere classified: Secondary | ICD-10-CM

## 2016-04-15 NOTE — Therapy (Signed)
[place] MAIN [facility] SERVICES [address] [place], [place], [address] Phone: [phone]   Fax:  [phone]  Physical Therapy Treatment  Patient Details  Name: [name] MRN: [id number] Date of Birth: [birth year] Referring Provider: Duanne Guess  Encounter Date: 04/15/2016      PT End of Session - 04/15/16 0845    Visit Number 8   Number of Visits 17   Date for PT Re-Evaluation 05/15/16   Authorization Type Gcode 8   PT Start Time 0830   PT Stop Time 0910   PT Time Calculation (min) 40 min   Equipment Utilized During Treatment Gait belt   Activity Tolerance Patient tolerated treatment well   Behavior During Therapy [facility] for tasks assessed/performed      Past Medical History:  Diagnosis Date  . 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  . Allergy   . Aortoiliac occlusive disease [facility]) April 2007   Status post bilateral common iliac artery stenting  . Atherosclerotic heart disease of artery bypass graft 1999   a) Cardiac Cath : Occluded SVG-D1;; ostial LM 80-90%, diffuse LAD dz w/ D1 &2 subtotalled, LCx 70% prox with competitive flow from SVG-OM, RCA 100% occluded; patent LIMA-LAD, LC-RCA and SVG-OM; b) TM Myoview 10/2010: Ex 6.5 min - 9 METS, HR to 149, No Ischemia or infarct, PVCs; c) Echo 10/2010: EF >55%, mild AoSclerosis, Conc Remodelling.; Myoview 2/ 2016: EF 55%. Low   risk. No ischemia.  . Atherosclerotic heart disease of native coronary artery without angina pectoris 1998   Multivessel disease on cath for angina: S/p CABG x 4 (LIMA-LAD, SVG-RCA, SVG-OM, SVG-DIAG); SVG-Diag occluded on cath 1999  . Cancer [facility])    Skin cancer  . Carotid artery occlusion without infarction 06/2006   a) High Grade L Carotid Stenosis on Doppler -> s/x L CEA; b)  Carotid Doppler 10/2013:: Left CEA patent, R. ICA 60-70% stenosis  . Colon polyps   . COPD (chronic obstructive pulmonary disease) ([place])   . Erectile dysfunction due to arterial insufficiency   . Essential hypertension   . GERD (gastroesophageal reflux disease)   . Hyperlipidemia with target LDL less than 70   . Insomnia   . Ischemic heart disease with chronotropic incompetence    Seen on CPET to evaluate Exertional Dyspnea. Only able to achieve 80% of peak heart rate; peak VO2 was 75% - but possibly underestimated.  . Lichen planus   . Memory loss   . OSA on CPAP   . Osteoporosis   . PAF (paroxysmal atrial fibrillation) ([place]) February 2016   CHA2DS2-VASc = 4. Anticoagulated with Eliquis. On combination beta blocker and calcium channel blocker for rate control  . Sciatic pain    Chronic  . TIA (transient ischemic attack)     Past Surgical History:  Procedure Laterality Date  . ANKLE SURGERY    . Arch aortogram and carotid aortogram  06/02/2006   Dr Oneida Alar did surgery  . CARDIAC CATHETERIZATION  01/30/1998     total native LAD andRCA ,mod native CIRC; svg to RCA and OBTUSE MARG. PATENT the left internal mammary artery graft to LAD ; LV NORMAL  /  . CAROTID ENDARTERECTOMY Left Oct. 29, 2007   Dr. Oneida Alar:  . CORONARY ARTERY BYPASS GRAFT  1998   LIMA-LAD, SVG-OM, SVG-RPDA,  SVG-DIAG  . CPET / MET  12/2012   Mild chronotropic incompetence - read 82% of predicted; also reduced effort; peak VO2 15.7 / 75% (did not reach Max effort) -- suggested ischemic response in last 1.5 minutes of exercise. Normal pulmonary function on PFTs but poor response to  . DOPPLER ECHOCARDIOGRAPHY  11/20/2010   EF =>55%; LV norm mild aortic scelorosis  . FRACTURE SURGERY Right 03/19/2015   wrist  . HIATAL HERNIA REPAIR    . ILIAC ARTERY STENT  12/15/2005   PTA and direct stenting rgt and lft common iliac arteries  . NM MYOCAR PERF WALL MOTION  11/11/2010; February 2016   a) TM STRESS----NORMAL PERFUSION ,EF  68%; b) EF 50-55%. Normal LV function. No significant ischemia or infarction.  . ORIF WRIST FRACTURE Right 03/19/2015   Procedure: OPEN REDUCTION INTERNAL FIXATION (ORIF) WRIST FRACTURE;  Surgeon: Hessie Knows, MD;  Location: [facility] ORS;  Service: Orthopedics;  Laterality: Right;  . THORACIC AORTA - CAROTID ANGIOGRAM  October 2007   Dr. 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 TRANSTHORACIC ECHOCARDIOGRAM  February 2016   [facility]: Normal LV function. Dilated left atrium.    There were no vitals filed for this visit.      Subjective Assessment - 04/15/16 0844    Subjective Patient is doing well today. No reports of pain.         NMR BLE  staggered stance anterior/posterior weight shifting on small rockerboard; CGA, increased difficulty and more unsteadiness with RLE fwd; min cues to increase weight shift over RLE BLE  NBOS stance on airex pad with no UE 5x15 sec  BLE staggered stance with front foot on dynadisc x 5 min; CGA, increased hip flexion to maintain balance, more difficulty with LLE fwd Side stepping on 2 x 4 in parallel bars without UE assist Matrix fwd/bwd/side stepping x 5   Leg press:100 lbs x 20 x 3, heel raises 20 x 3   side steps in // bars with yellow band x 5 laps Patient needs occasional verbal cueing to improve posture and cueing to correctly perform exercises slowly, holding at end of range to increase motor firing of desired muscle to encourage fatigue.                         PT Education - 04/15/16 0845    Education provided Yes   Education Details safety with mobility   Person(s) Educated Patient   Methods Explanation   Comprehension Verbalized understanding             PT Long Term Goals - 03/20/16 1012      PT LONG TERM GOAL #1   Title Patient will be independent in home exercise program to improve strength/mobility for better functional  independence with ADLs.   Time 8   Period Weeks   Status New     PT LONG TERM GOAL #2   Title Patient (> 71 years old) will complete five times sit to stand test in < 15 seconds indicating an increased LE strength and improved balance.   Time 8   Period Weeks   Status New     PT LONG TERM GOAL #3   Title Patient will increase Berg Balance score by > 6 points to demonstrate decreased fall risk during functional activities.   Baseline 46/56   Time 8   Period Weeks  Status New     PT LONG TERM GOAL #4   Title Patient will increase 10 meter walk test to >1.56ms as to improve gait speed for better community ambulation and to reduce fall risk.   Time 8   Period Weeks   Status New     PT LONG TERM GOAL #5   Title Patient will be independent with ascend/descend 12 steps using single UE in step over step pattern without LOB.   Baseline uses 2 hands on one railing at home   Time [place]- 04/15/16 0846    Clinical Impression Statement Motor control of LE much improved.  Muscle fatigue but no major pain complaints. Patient advancing to red theraband for exercises listed above.   Rehab Potential Good   Clinical Impairments Affecting Rehab Potential recent shoulder fracture and wrist,    PT Frequency 2x / week   PT Duration 8 weeks   PT Treatment/Interventions Balance training;Therapeutic exercise;Therapeutic activities;Functional mobility training;Gait training;Neuromuscular re-education   PT Next Visit Plan balance and strength training   PT Home Exercise Plan corner in modified tandem standing    Consulted and Agree with Plan of Care Patient      Patient will benefit from skilled therapeutic intervention in order to improve the following deficits and impairments:  Abnormal gait, Pain, Decreased mobility, Decreased strength, Decreased activity tolerance, Decreased balance, Difficulty walking, Impaired flexibility  Visit  Diagnosis: Difficulty in walking, not elsewhere classified  Muscle weakness (generalized)     Problem List Patient Active Problem List   Diagnosis Date Noted  . Disorder of oral mucous membrane 02/10/2015  . CN (constipation) 02/10/2015  . DD (diverticular disease) 02/10/2015  . Accumulation of fluid in tissues 02/10/2015  . Elevated WBC count 02/10/2015  . Fatigue 02/10/2015  . Hemorrhoid 02/10/2015  . Hypoxia 02/10/2015  . Cannot sleep 02/10/2015  . Lichen planus 077/41/2878 . Restless leg 02/10/2015  . 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 Temporary cerebral vascular dysfunction 02/10/2015  . B12 deficiency 02/10/2015  . SOB (shortness of breath) 12/14/2014  . Lower extremity edema 12/14/2014  . COPD (chronic obstructive pulmonary disease) ([place] 11/14/2014  . Post Inflammatory Lung Changes 11/14/2014  . Confusion state 11/08/2014  . Amnesia 11/08/2014  . PAF (paroxysmal atrial fibrillation) (HCC) CHA2DS2-VASc = 4. AC = Eliquis   . Hypotension 12/09/2013  . Insomnia 12/09/2013  . OSA on CPAP   . Erectile dysfunction   . Dyspnea on exertion - -essentially resolved with BB dose reduction & wgt loss 03/29/2013  . Overweight (BMI 25.0-29.9) -- Wgt back up 01/17/2013  . Atherosclerotic heart disease of native coronary artery without angina pectoris - s/p CABG, occluded SVG-D1; patent LIMA-LAD, SVG-OM, SVG- RCA   . Essential hypertension   . Hyperlipidemia with target LDL less than 70   . Aorto-iliac disease ([place]   . Basal cell carcinoma of eyelid 01/03/2013  . Carotid artery stenosis 09/09/2012  . ED (erectile dysfunction) of organic origin 11/19/2009  . Allergic rhinitis 08/17/2009  . Arthralgia of ankle or foot 06/04/2009  . Adaptation reaction 05/28/2009  . Arterial vascular disease 05/28/2009  . Benign prostatic hypertrophy without urinary obstruction 05/28/2009  . Acid reflux 05/28/2009  . Arthritis, degenerative 05/28/2009  . Need for prophylactic hormone  replacement therapy (postmenopausal) 05/28/2009  . Current tobacco use 05/28/2009  . PAD (peripheral artery disease) -  bilateral common iliac stents 12/15/2005   Alanson Puls, PT, DPT [place] S 04/15/2016, 8:49 AM  [place] MAIN [facility] SERVICES [address] [place], [place], [address] Phone: [phone]   Fax:  [phone]  Name: NAMAN SPYCHALSKI MRN: [id number] Date of Birth: 1933/09/12

## 2016-04-17 ENCOUNTER — Ambulatory Visit: Payer: Medicare Other | Admitting: Physical Therapy

## 2016-04-17 ENCOUNTER — Encounter: Payer: Self-pay | Admitting: Physical Therapy

## 2016-04-17 DIAGNOSIS — R262 Difficulty in walking, not elsewhere classified: Secondary | ICD-10-CM | POA: Diagnosis not present

## 2016-04-17 DIAGNOSIS — M6281 Muscle weakness (generalized): Secondary | ICD-10-CM

## 2016-04-17 NOTE — Therapy (Signed)
[place] MAIN [facility] SERVICES [address] [place], [place], [address] Phone: [phone]   Fax:  [phone]  Physical Therapy Treatment  Patient Details  Name: [name] MRN: [id number] Date of Birth: [birth year] Referring Provider: Duanne Guess  Encounter Date: 04/17/2016      PT End of Session - 04/17/16 0849    Visit Number 9   Number of Visits 17   Date for PT Re-Evaluation 05/15/16   Authorization Type G code 9   Equipment Utilized During Treatment Gait belt   Activity Tolerance Patient tolerated treatment well   Behavior During Therapy [facility] for tasks assessed/performed      Past Medical History:  Diagnosis Date  . 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  . Allergy   . Aortoiliac occlusive disease [facility]) April 2007   Status post bilateral common iliac artery stenting  . Atherosclerotic heart disease of artery bypass graft 1999   a) Cardiac Cath : Occluded SVG-D1;; ostial LM 80-90%, diffuse LAD dz w/ D1 &2 subtotalled, LCx 70% prox with competitive flow from SVG-OM, RCA 100% occluded; patent LIMA-LAD, LC-RCA and SVG-OM; b) TM Myoview 10/2010: Ex 6.5 min - 9 METS, HR to 149, No Ischemia or infarct, PVCs; c) Echo 10/2010: EF >55%, mild AoSclerosis, Conc Remodelling.; Myoview 2/ 2016: EF 55%. Low   risk. No ischemia.  . Atherosclerotic heart disease of native coronary artery without angina pectoris 1998   Multivessel disease on cath for angina: S/p CABG x 4 (LIMA-LAD, SVG-RCA, SVG-OM, SVG-DIAG); SVG-Diag occluded on cath 1999  . Cancer [facility])    Skin cancer  . Carotid artery occlusion without infarction 06/2006   a) High Grade L Carotid Stenosis on Doppler -> s/x L CEA; b) Carotid Doppler 10/2013:: Left CEA patent, R. ICA 60-70% stenosis  . Colon  polyps   . COPD (chronic obstructive pulmonary disease) ([place])   . Erectile dysfunction due to arterial insufficiency   . Essential hypertension   . GERD (gastroesophageal reflux disease)   . Hyperlipidemia with target LDL less than 70   . Insomnia   . Ischemic heart disease with chronotropic incompetence    Seen on CPET to evaluate Exertional Dyspnea. Only able to achieve 80% of peak heart rate; peak VO2 was 75% - but possibly underestimated.  . Lichen planus   . Memory loss   . OSA on CPAP   . Osteoporosis   . PAF (paroxysmal atrial fibrillation) ([place]) February 2016   CHA2DS2-VASc = 4. Anticoagulated with Eliquis. On combination beta blocker and calcium channel blocker for rate control  . Sciatic pain    Chronic  . TIA (transient ischemic attack)     Past Surgical History:  Procedure Laterality Date  . ANKLE SURGERY    . Arch aortogram and carotid aortogram  06/02/2006   Dr Oneida Alar did surgery  . CARDIAC CATHETERIZATION  01/30/1998     total native LAD andRCA ,mod native CIRC; svg to RCA and OBTUSE MARG. PATENT the left internal mammary artery graft to LAD ; LV NORMAL  /  . CAROTID ENDARTERECTOMY Left Oct. 29, 2007   Dr. Oneida Alar:  . CORONARY ARTERY BYPASS GRAFT  1998   LIMA-LAD, SVG-OM, SVG-RPDA, SVG-DIAG  . CPET / MET  12/2012   Mild chronotropic incompetence - read 82% of predicted; also  reduced effort; peak VO2 15.7 / 75% (did not reach Max effort) -- suggested ischemic response in last 1.5 minutes of exercise. Normal pulmonary function on PFTs but poor response to  . DOPPLER ECHOCARDIOGRAPHY  11/20/2010   EF =>55%; LV norm mild aortic scelorosis  . FRACTURE SURGERY Right 03/19/2015   wrist  . HIATAL HERNIA REPAIR    . ILIAC ARTERY STENT  12/15/2005   PTA and direct stenting rgt and lft common iliac arteries  . NM MYOCAR PERF WALL MOTION  11/11/2010; February 2016   a) TM STRESS----NORMAL PERFUSION ,EF 68%; b) EF 50-55%. Normal LV function. No significant ischemia or  infarction.  . ORIF WRIST FRACTURE Right 03/19/2015   Procedure: OPEN REDUCTION INTERNAL FIXATION (ORIF) WRIST FRACTURE;  Surgeon: Hessie Knows, MD;  Location: [facility] ORS;  Service: Orthopedics;  Laterality: Right;  . THORACIC AORTA - CAROTID ANGIOGRAM  October 2007   Dr. 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 TRANSTHORACIC ECHOCARDIOGRAM  February 2016   [facility]: Normal LV function. Dilated left atrium.    There were no vitals filed for this visit.      Subjective Assessment - 04/17/16 0846    Subjective Patient is doing well today. reports of pain 3/10   Currently in Pain? Yes   Pain Score 3    Pain Location Calf   Pain Orientation Right   Pain Descriptors / Indicators Aching   Pain Type Acute pain   Multiple Pain Sites No      NMR: Bil staggered stance with front foot on dynadisc and 1/2 foam x 8 min bil tandem stance on 1/2 foam roll (upside down) with no UE support, 3x1 min each; min A throughout as pt demonstrated increased R lateral LOB bil bil stance on 1/2 foam roll with no UE support and EC, 5x10 sec each; increased posterior LOB and decreased ankle strategies noted throughout  THEREX Resisted walking fwd, retro, bil side stepping with12.5# x 3 laps each; min A throughout due to pt being unsteady and losing balance multiple times throughout each direction; increased time to complete task Eccentric step downs from 6 inch stool x 10 BLE  Patient needs occasional verbal cueing to improve posture and cueing to correctly perform exercises slowly, holding at end of range to increase motor firing of desired muscle to encourage fatigue.                            PT Education - 04/17/16 0848    Education provided Yes   Education Details safety with mobility   Person(s) Educated Patient   Methods Explanation   Comprehension Verbalized understanding              PT Long Term Goals - 03/20/16 1012      PT LONG TERM GOAL #1   Title Patient will be independent in home exercise program to improve strength/mobility for better functional independence with ADLs.   Time 8   Period Weeks   Status New     PT LONG TERM GOAL #2   Title Patient (> 45 years old) will complete five times sit to stand test in < 15 seconds indicating an increased LE strength and improved balance.   Time 8   Period Weeks   Status New     PT LONG TERM GOAL #3   Title Patient will increase Berg Balance score  by > 6 points to demonstrate decreased fall risk during functional activities.   Baseline 46/56   Time 8   Period Weeks   Status New     PT LONG TERM GOAL #4   Title Patient will increase 10 meter walk test to >1.41ms as to improve gait speed for better community ambulation and to reduce fall risk.   Time 8   Period Weeks   Status New     PT LONG TERM GOAL #5   Title Patient will be independent with ascend/descend 12 steps using single UE in step over step pattern without LOB.   Baseline uses 2 hands on one railing at home   Time 8   Period Weeks   Status New               Plan - 04/17/16 0850    Clinical Impression Statement  Muscle fatigue but no major pain complaints. Patient continues to have dynamic standing balance deficits and is unsteady.    Rehab Potential Good   Clinical Impairments Affecting Rehab Potential recent shoulder fracture and wrist,    PT Frequency 2x / week   PT Duration 8 weeks   PT Treatment/Interventions Balance training;Therapeutic exercise;Therapeutic activities;Functional mobility training;Gait training;Neuromuscular re-education   PT Next Visit Plan balance and strength training   PT Home Exercise Plan corner in modified tandem standing    Consulted and Agree with Plan of Care Patient      Patient will benefit from skilled therapeutic intervention in order to improve the following deficits and impairments:  Abnormal  gait, Pain, Decreased mobility, Decreased strength, Decreased activity tolerance, Decreased balance, Difficulty walking, Impaired flexibility  Visit Diagnosis: Difficulty in walking, not elsewhere classified  Muscle weakness (generalized)     Problem List Patient Active Problem List   Diagnosis Date Noted  . Disorder of oral mucous membrane 02/10/2015  . CN (constipation) 02/10/2015  . DD (diverticular disease) 02/10/2015  . Accumulation of fluid in tissues 02/10/2015  . Elevated WBC count 02/10/2015  . Fatigue 02/10/2015  . Hemorrhoid 02/10/2015  . Hypoxia 02/10/2015  . Cannot sleep 02/10/2015  . Lichen planus 026/71/2458 . Restless leg 02/10/2015  . 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 Temporary cerebral vascular dysfunction 02/10/2015  . B12 deficiency 02/10/2015  . SOB (shortness of breath) 12/14/2014  . Lower extremity edema 12/14/2014  . COPD (chronic obstructive pulmonary disease) [place] 11/14/2014  . Post Inflammatory Lung Changes 11/14/2014  . Confusion state 11/08/2014  . Amnesia 11/08/2014  . PAF (paroxysmal atrial fibrillation) (HCC) CHA2DS2-VASc = 4. AC = Eliquis   . Hypotension 12/09/2013  . Insomnia 12/09/2013  . OSA on CPAP   . Erectile dysfunction   . Dyspnea on exertion - -essentially resolved with BB dose reduction & wgt loss 03/29/2013  . Overweight (BMI 25.0-29.9) -- Wgt back up 01/17/2013  . Atherosclerotic heart disease of native coronary artery without angina pectoris - s/p CABG, occluded SVG-D1; patent LIMA-LAD, SVG-OM, SVG- RCA   . Essential hypertension   . Hyperlipidemia with target LDL less than 70   . Aorto-iliac disease ([place]   . Basal cell carcinoma of eyelid 01/03/2013  . Carotid artery stenosis 09/09/2012  . ED (erectile dysfunction) of organic origin 11/19/2009  . Allergic rhinitis 08/17/2009  . Arthralgia of ankle or foot 06/04/2009  . Adaptation reaction 05/28/2009  . Arterial vascular disease 05/28/2009  . Benign prostatic  hypertrophy without urinary obstruction 05/28/2009  . Acid reflux 05/28/2009  . Arthritis, degenerative 05/28/2009  .  Need for prophylactic hormone replacement therapy (postmenopausal) 05/28/2009  . Current tobacco use 05/28/2009  . PAD (peripheral artery disease) - bilateral common iliac stents 12/15/2005   Alanson Puls, PT, DPT [place] S 04/17/2016, 8:53 AM  [place] MAIN [facility] SERVICES [address] [place], [place], [address] Phone: [phone]   Fax:  [phone]  Name: [name] MRN: [id number] Date of Birth: July 05, 1934

## 2016-04-22 ENCOUNTER — Ambulatory Visit: Payer: Medicare Other | Admitting: Physical Therapy

## 2016-04-22 ENCOUNTER — Encounter: Payer: Self-pay | Admitting: Physical Therapy

## 2016-04-22 DIAGNOSIS — R262 Difficulty in walking, not elsewhere classified: Secondary | ICD-10-CM | POA: Diagnosis not present

## 2016-04-22 DIAGNOSIS — M6281 Muscle weakness (generalized): Secondary | ICD-10-CM | POA: Diagnosis not present

## 2016-04-22 NOTE — Therapy (Signed)
[place] MAIN [facility] SERVICES [address] [place], [place], [address] Phone: [phone]   Fax:  [phone]  Physical Therapy Treatment  Patient Details  Name: [name] MRN: [id number] Date of Birth: July 12, 1934 Referring Provider: Duanne Guess  Encounter Date: 04/22/2016      PT End of Session - 04/22/16 0836    Visit Number 10   Number of Visits 17   Date for PT Re-Evaluation 05/15/16   Authorization Type G code 10   PT Start Time 0830   PT Stop Time 0915   PT Time Calculation (min) 45 min   Equipment Utilized During Treatment Gait belt   Activity Tolerance Patient tolerated treatment well   Behavior During Therapy [facility] for tasks assessed/performed      Past Medical History:  Diagnosis Date  . 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  . Allergy   . Aortoiliac occlusive disease [facility]) April 2007   Status post bilateral common iliac artery stenting  . Atherosclerotic heart disease of artery bypass graft 1999   a) Cardiac Cath : Occluded SVG-D1;; ostial LM 80-90%, diffuse LAD dz w/ D1 &2 subtotalled, LCx 70% prox with competitive flow from SVG-OM, RCA 100% occluded; patent LIMA-LAD, LC-RCA and SVG-OM; b) TM Myoview 10/2010: Ex 6.5 min - 9 METS, HR to 149, No Ischemia or infarct, PVCs; c) Echo 10/2010: EF >55%, mild AoSclerosis, Conc Remodelling.; Myoview 2/ 2016: EF 55%. Low   risk. No ischemia.  . Atherosclerotic heart disease of native coronary artery without angina pectoris 1998   Multivessel disease on cath for angina: S/p CABG x 4 (LIMA-LAD, SVG-RCA, SVG-OM, SVG-DIAG); SVG-Diag occluded on cath 1999  . Cancer [facility])    Skin cancer  . Carotid artery occlusion without infarction 06/2006   a) High Grade L Carotid Stenosis on Doppler -> s/x L CEA; b)  Carotid Doppler 10/2013:: Left CEA patent, R. ICA 60-70% stenosis  . Colon polyps   . COPD (chronic obstructive pulmonary disease) ([place])   . Erectile dysfunction due to arterial insufficiency   . Essential hypertension   . GERD (gastroesophageal reflux disease)   . Hyperlipidemia with target LDL less than 70   . Insomnia   . Ischemic heart disease with chronotropic incompetence    Seen on CPET to evaluate Exertional Dyspnea. Only able to achieve 80% of peak heart rate; peak VO2 was 75% - but possibly underestimated.  . Lichen planus   . Memory loss   . OSA on CPAP   . Osteoporosis   . PAF (paroxysmal atrial fibrillation) ([place]) February 2016   CHA2DS2-VASc = 4. Anticoagulated with Eliquis. On combination beta blocker and calcium channel blocker for rate control  . Sciatic pain    Chronic  . TIA (transient ischemic attack)     Past Surgical History:  Procedure Laterality Date  . ANKLE SURGERY    . Arch aortogram and carotid aortogram  06/02/2006   Dr Oneida Alar did surgery  . CARDIAC CATHETERIZATION  01/30/1998     total native LAD andRCA ,mod native CIRC; svg to RCA and OBTUSE MARG. PATENT the left internal mammary artery graft to LAD ; LV NORMAL  /  . CAROTID ENDARTERECTOMY Left Oct. 29, 2007   Dr. Oneida Alar:  . CORONARY ARTERY BYPASS GRAFT  1998   LIMA-LAD, SVG-OM,  SVG-RPDA, SVG-DIAG  . CPET / MET  12/2012   Mild chronotropic incompetence - read 82% of predicted; also reduced effort; peak VO2 15.7 / 75% (did not reach Max effort) -- suggested ischemic response in last 1.5 minutes of exercise. Normal pulmonary function on PFTs but poor response to  . DOPPLER ECHOCARDIOGRAPHY  11/20/2010   EF =>55%; LV norm mild aortic scelorosis  . FRACTURE SURGERY Right 03/19/2015   wrist  . HIATAL HERNIA REPAIR    . ILIAC ARTERY STENT  12/15/2005   PTA and direct stenting rgt and lft common iliac arteries  . NM MYOCAR PERF WALL MOTION  11/11/2010; February 2016   a) TM STRESS----NORMAL PERFUSION ,EF  68%; b) EF 50-55%. Normal LV function. No significant ischemia or infarction.  . ORIF WRIST FRACTURE Right 03/19/2015   Procedure: OPEN REDUCTION INTERNAL FIXATION (ORIF) WRIST FRACTURE;  Surgeon: Hessie Knows, MD;  Location: [facility] ORS;  Service: Orthopedics;  Laterality: Right;  . THORACIC AORTA - CAROTID ANGIOGRAM  October 2007   Dr. 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 TRANSTHORACIC ECHOCARDIOGRAM  February 2016   [facility]: Normal LV function. Dilated left atrium.    There were no vitals filed for this visit.      Subjective Assessment - 04/22/16 0836    Subjective Patient is doing well today. reports no pain.   Pain Score 0-No pain   Multiple Pain Sites No      OUTCOME MEASURES: TEST  Outcome  Interpretation   5 times sit<>stand  13.60sec  >60 yo, >15 sec indicates increased risk for falls   10 meter walk test  1.2             m/s  <1.0 m/s indicates increased risk for falls; limited community ambulator   Timed up and Go   11 .05             sec  <14 sec indicates increased risk for falls   6 minute walk test     1330          Feet  1000 feet is community ambulator      NMR bil staggered stance anterior/posterior weight shifting on small rockerboard; CGA, increased difficulty and more unsteadiness with RLE fwd; min cues to increase weight shift over RLE bil NBOS stance on airex pad with no UE and EC, 5x15 sec  bil staggered stance with front foot on dynadisc and balloon taps against mirror x 5 min; CGA, increased hip flexion to maintain balance, more difficulty with LLE fwd Patient needs occasional verbal cueing to improve posture and cueing to correctly perform exercises slowly, holding at end of range to increase motor firing of desired muscle to encourage fatigue.                           PT Education - 04/22/16 [phone]    Education provided Yes   Education Details  safety with ascending steps   Person(s) Educated Patient   Methods Explanation   Comprehension Verbalized understanding             PT Long Term Goals - 04/22/16 1007      PT LONG TERM GOAL #1   Title Patient will be independent in home exercise program to improve strength/mobility for better functional independence with ADLs.   Time 8   Period Weeks   Status Achieved  PT LONG TERM GOAL #2   Title Patient (> 37 years old) will complete five times sit to stand test in < 15 seconds indicating an increased LE strength and improved balance.   Time 8   Period Weeks   Status Partially Met     PT LONG TERM GOAL #3   Title Patient will increase Berg Balance score by > 6 points to demonstrate decreased fall risk during functional activities.   Time 8   Period Weeks   Status Partially Met     PT LONG TERM GOAL #4   Title Patient will increase 10 meter walk test to >1.37ms as to improve gait speed for better community ambulation and to reduce fall risk.   Time 8   Period Weeks   Status Partially Met     PT LONG TERM GOAL #5   Title Patient will be independent with ascend/descend 12 steps using single UE in step over step pattern without LOB.   Time 8   Period Weeks   Status Partially Met               Plan - 0August 23, [id number]    Clinical Impression Statement Patient is improving with outcome measures including TUG, 5 x sit to stand, 10 MW, 6 MW and her falls risk is decreasing.      Patient will benefit from skilled therapeutic intervention in order to improve the following deficits and impairments:     Visit Diagnosis: Difficulty in walking, not elsewhere classified  Muscle weakness (generalized)       G-Codes - [id number]    Functional Assessment Tool Used Tug, 5 x sit to stand, 10 MW, 6 MW   Functional Limitation Mobility: Walking and moving around   Mobility: Walking and Moving Around Current Status [phone] At least 20 percent but less than 40 percent  impaired, limited or restricted   Mobility: Walking and Moving Around Goal Status [phone] At least 1 percent but less than 20 percent impaired, limited or restricted      Problem List Patient Active Problem List   Diagnosis Date Noted  . Disorder of oral mucous membrane 02/10/2015  . CN (constipation) 02/10/2015  . DD (diverticular disease) 02/10/2015  . Accumulation of fluid in tissues 02/10/2015  . Elevated WBC count 02/10/2015  . Fatigue 02/10/2015  . Hemorrhoid 02/10/2015  . Hypoxia 02/10/2015  . Cannot sleep 02/10/2015  . Lichen planus 069/67/8938 . Restless leg 02/10/2015  . 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 Temporary cerebral vascular dysfunction 02/10/2015  . B12 deficiency 02/10/2015  . SOB (shortness of breath) 12/14/2014  . Lower extremity edema 12/14/2014  . COPD [place]ch[place] obstructive pulmonary disease) ([place] 11/14/2014  . Post Inflammatory Lung Changes 11/14/2014  . Confusion state 11/08/2014  . Amnesia 11/08/2014  . PAF (paroxysmal atrial fibrillation) (HCC) CHA2DS2-VASc = 4. AC = Eliquis   . Hypotension 12/09/2013  . Insomnia 12/09/2013  . OSA on CPAP   . Erectile dysfunction   . Dyspnea on exertion - -essentially resolved with BB dose reduction & wgt loss 03/29/2013  . Overweight (BMI 25.0-29.9) -- Wgt back up 01/17/2013  . Atherosclerotic heart disease of native coronary artery without angina pectoris - s/p CABG, occluded SVG-D1; patent LIMA-LAD, SVG-OM, SVG- RCA   . Essential hypertension   . Hyperlipidemia with target LDL less than 70   . A[place]-iliac disease ([place]   . Basal cell carcinoma of eyelid 01/03/2013  . Carotid artery stenosis 09/09/2012  . ED (erectile  dysfunction) of organic origin 11/19/2009  . Allergic rhinitis 08/17/2009  . Arthralgia of ankle or foot 06/04/2009  . Adaptation reaction 05/28/2009  . Arterial vascular disease 05/28/2009  . Benign prostatic hypertrophy without urinary obstruction 05/28/2009  . Acid reflux 05/28/2009   . Arthritis, degenerative 05/28/2009  . Need for prophylactic hormone replacement therapy (postmenopausal) 05/28/2009  . Current tobacco use 05/28/2009  . PAD (peripheral artery disease) - bilateral common iliac stents 12/15/2005   Alanson Puls, PT, DPT [place], [place] 04/22/2016, 10:08 AM  [place] MAIN [facility] SERVICES [address] [place], [place], [address] Phone: [phone]   Fax:  [phone]  Name: [name] MRN: [id number] Date of Birth: 1934-03-19

## 2016-04-23 DIAGNOSIS — L905 Scar conditions and fibrosis of skin: Secondary | ICD-10-CM | POA: Diagnosis not present

## 2016-04-23 DIAGNOSIS — C44319 Basal cell carcinoma of skin of other parts of face: Secondary | ICD-10-CM | POA: Diagnosis not present

## 2016-04-24 ENCOUNTER — Ambulatory Visit: Payer: Medicare Other | Admitting: Physical Therapy

## 2016-04-25 ENCOUNTER — Ambulatory Visit (INDEPENDENT_AMBULATORY_CARE_PROVIDER_SITE_OTHER): Payer: Medicare Other | Admitting: Family Medicine

## 2016-04-25 ENCOUNTER — Encounter: Payer: Self-pay | Admitting: Family Medicine

## 2016-04-25 VITALS — BP 128/78 | HR 71 | Temp 97.4°F | Resp 16 | Ht 71.0 in | Wt 223.0 lb

## 2016-04-25 DIAGNOSIS — H3530 Unspecified macular degeneration: Secondary | ICD-10-CM | POA: Diagnosis not present

## 2016-04-25 DIAGNOSIS — N5201 Erectile dysfunction due to arterial insufficiency: Secondary | ICD-10-CM | POA: Insufficient documentation

## 2016-04-25 DIAGNOSIS — I6522 Occlusion and stenosis of left carotid artery: Secondary | ICD-10-CM

## 2016-04-25 DIAGNOSIS — E785 Hyperlipidemia, unspecified: Secondary | ICD-10-CM | POA: Diagnosis not present

## 2016-04-25 DIAGNOSIS — I7409 Other arterial embolism and thrombosis of abdominal aorta: Secondary | ICD-10-CM | POA: Diagnosis not present

## 2016-04-25 DIAGNOSIS — E039 Hypothyroidism, unspecified: Secondary | ICD-10-CM | POA: Diagnosis not present

## 2016-04-25 DIAGNOSIS — Q254 Congenital malformation of aorta unspecified: Secondary | ICD-10-CM | POA: Diagnosis not present

## 2016-04-25 DIAGNOSIS — I779 Disorder of arteries and arterioles, unspecified: Secondary | ICD-10-CM

## 2016-04-25 DIAGNOSIS — D72829 Elevated white blood cell count, unspecified: Secondary | ICD-10-CM

## 2016-04-25 DIAGNOSIS — Z0000 Encounter for general adult medical examination without abnormal findings: Secondary | ICD-10-CM | POA: Diagnosis not present

## 2016-04-25 DIAGNOSIS — I10 Essential (primary) hypertension: Secondary | ICD-10-CM | POA: Diagnosis not present

## 2016-04-25 DIAGNOSIS — K219 Gastro-esophageal reflux disease without esophagitis: Secondary | ICD-10-CM

## 2016-04-25 DIAGNOSIS — I259 Chronic ischemic heart disease, unspecified: Secondary | ICD-10-CM | POA: Diagnosis not present

## 2016-04-25 DIAGNOSIS — I2589 Other forms of chronic ischemic heart disease: Secondary | ICD-10-CM

## 2016-04-25 NOTE — Progress Notes (Signed)
Patient: [name], Male    DOB: 1933-12-08, 82 y.o.   MRN: [id number] Visit Date: 04/25/2016  Today's Provider: Lelon Huh, MD   Chief Complaint  Patient presents with  . Medicare Wellness  . Hypertension    follow up  . Hyperlipidemia    follow up   Subjective:  This is a previous patient of Dr. [name] present today as new patient to me to establish care and follow up on chronic medical problems.     Annual wellness visit [name] is a 82 y.o. male. He feels fairly well. He reports exercising weekly. He reports he is sleeping fairly well. He has injured himself several times this year from falls. He has seen Dr. [name] for neurological evaluation, and is going to physical therapy for balance and gait training.   -----------------------------------------------------------  Hypertension, follow-up:  BP Readings from Last 3 Encounters:  03/19/16 100/60  12/18/15 126/70  12/12/15 (!) 111/55    He was last seen for hypertension 4 months ago.  BP at that visit was 126/70. Management since that visit includes no changes. He reports good compliance with treatment. He is not having side effects.  He is exercising. He is adherent to low salt diet.   Outside blood pressures are rarely checked. He is experiencing none.  Patient denies chest pain, chest pressure/discomfort, claudication, dyspnea, exertional chest pressure/discomfort, fatigue, irregular heart beat, lower extremity edema, near-syncope, orthopnea, palpitations, paroxysmal nocturnal dyspnea, syncope and tachypnea.   Cardiovascular risk factors include advanced age (older than 32 for men, 62 for women), hypertension and male gender.  Use of agents associated with hypertension: none.     Weight trend: decreasing steadily Wt Readings from Last 3 Encounters:  03/19/16 226 lb 12.8 oz (102.9 kg)  12/18/15 226 lb (102.5 kg)  12/12/15 228 lb (103.4 kg)    Current diet: in general, a  "healthy" diet    ------------------------------------------------------------------------  Lipid/Cholesterol, Follow-up:   Last seen for this4 months ago.  Management changes since that visit include none. . Last Lipid Panel:    Component Value Date/Time   CHOL 124 12/20/2015 0803   TRIG 132 12/20/2015 0803   HDL 35 (L) 12/20/2015 0803   CHOLHDL 3.4 03/15/2015 0940   LDLCALC 63 12/20/2015 0803    Risk factors for vascular disease include hypertension  He reports good compliance with treatment. He is not having side effects.  Current symptoms include none and have been stable. Weight trend: decreasing steadily Prior visit with dietician: no Current diet: in general, a "healthy" diet   Current exercise: Physical Therapy  Wt Readings from Last 3 Encounters:  03/19/16 226 lb 12.8 oz (102.9 kg)  12/18/15 226 lb (102.5 kg)  12/12/15 228 lb (103.4 kg)    ------------------------------------------------------------------- Follow up Abnormal TSH: Patient was last seen for this problem 4 months ago. Labs were ordered showing thyroid  Was borderline. Patient was to have TSH and T4 rechecked in 3-6 months.  Lab Results  Component Value Date   TSH 5.980 (H) 12/20/2015     Review of Systems  Constitutional: Negative for appetite change, chills, fatigue and fever.  HENT: Positive for drooling and hearing loss. Negative for congestion, ear pain, nosebleeds and trouble swallowing.   Eyes: Negative for pain and visual disturbance.  Respiratory: Negative for cough, chest tightness and shortness of breath.   Cardiovascular: Negative for chest pain, palpitations and leg swelling.  Gastrointestinal: Negative for abdominal pain, blood in stool,  constipation, diarrhea, nausea and vomiting.  Endocrine: Positive for polyuria. Negative for polydipsia and polyphagia.  Genitourinary: Positive for enuresis, frequency and urgency. Negative for dysuria and flank pain.  Musculoskeletal: Negative  for arthralgias, back pain, joint swelling, myalgias and neck stiffness.  Skin: Negative for color change, rash and wound.  Neurological: Negative for dizziness, tremors, seizures, speech difficulty, weakness, light-headedness and headaches.  Hematological: Bruises/bleeds easily.  Psychiatric/Behavioral: Negative for behavioral problems, confusion, decreased concentration, dysphoric mood and sleep disturbance. The patient is not nervous/anxious.   All other systems reviewed and are negative.   Social History   Social History  . Marital status: Widowed    Spouse name: N/A  . Number of children: N/A  . Years of education: N/A   Occupational History  . Not on file.   Social History Main Topics  . Smoking status: Former Smoker    Types: Cigarettes    Quit date: 08/04/1997  . Smokeless tobacco: Never Used  . Alcohol use No  . Drug use: No  . Sexual activity: Not on file   Other Topics Concern  . Not on file   Social History Narrative   He is a widowed father of one, grandfather of 10 with 3 stepchildren. He has been working out vigorously at [employer] the basis. He's up to doing 20 minutes on the stationary bike and 10-15 minutes on the treadmill. He doesn't smoke or drink.    Past Medical History:  Diagnosis Date  . Basal cell carcinoma of eyelid 01/03/2013  . Cancer [facility])    Skin cancer  . Colon polyps   . Hemorrhoid 02/10/2015  . Memory loss   . Osteoporosis   . Sciatic pain    Chronic  . Temporary cerebral vascular dysfunction 02/10/2015   Had negative work-up.  July, 2012.  No medication changes, had forgot them that day, got dehydrated.   [name] TIA (transient ischemic attack)      Patient Active Problem List   Diagnosis Date Noted  . Hypothyroidism 04/25/2016  . Macular degeneration 04/25/2016  . Erectile dysfunction due to arterial insufficiency   . Ischemic heart disease with chronotropic incompetence   . CN (constipation) 02/10/2015  . DD (diverticular disease)  02/10/2015  . Accumulation of fluid in tissues 02/10/2015  . Elevated WBC count 02/10/2015  . Fatigue 02/10/2015  . Hypoxia 02/10/2015  . Lichen planus 16/06/9603  . Restless leg 02/10/2015  . 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 B12 deficiency 02/10/2015  . SOB (shortness of breath) 12/14/2014  . Lower extremity edema 12/14/2014  . COPD (chronic obstructive pulmonary disease) ([place]) 11/14/2014  . Post Inflammatory Lung Changes 11/14/2014  . Confusion state 11/08/2014  . Amnesia 11/08/2014  . PAF (paroxysmal atrial fibrillation) (HCC) CHA2DS2-VASc = 4. AC = Eliquis   . Insomnia 12/09/2013  . OSA on CPAP   . Erectile dysfunction   . Dyspnea on exertion - -essentially resolved with BB dose reduction & wgt loss 03/29/2013  . Overweight (BMI 25.0-29.9) -- Wgt back up 01/17/2013  . Atherosclerotic heart disease of native coronary artery without angina pectoris - s/p CABG, occluded SVG-D1; patent LIMA-LAD, SVG-OM, SVG- RCA   . Essential hypertension   . Hyperlipidemia with target LDL less than 70   . Aorto-iliac disease ([place])   . Allergic rhinitis 08/17/2009  . Arthralgia of ankle or foot 06/04/2009  . Adaptation reaction 05/28/2009  . Benign prostatic hypertrophy without urinary obstruction 05/28/2009  . Acid reflux 05/28/2009  . Arthritis, degenerative 05/28/2009  .  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 Carotid artery occlusion without infarction 06/01/2006  . PAD (peripheral artery disease) - bilateral common iliac stents 12/15/2005  . Aortoiliac occlusive disease ([place]) 11/30/2005  . Atherosclerotic heart disease of artery bypass graft 09/01/1997    Past Surgical History:  Procedure Laterality Date  . ANKLE SURGERY    . Arch aortogram and carotid aortogram  06/02/2006   Dr Oneida Alar did surgery  . BASAL CELL CARCINOMA EXCISION  04/2016   Dermatology  . CARDIAC CATHETERIZATION  01/30/1998     total native LAD andRCA ,mod native CIRC; svg to RCA and OBTUSE MARG.  PATENT the left internal mammary artery graft to LAD ; LV NORMAL  /  . CAROTID ENDARTERECTOMY Left Oct. 29, 2007   Dr. Oneida Alar:  . CORONARY ARTERY BYPASS GRAFT  1998   LIMA-LAD, SVG-OM, SVG-RPDA, SVG-DIAG  . CPET / MET  12/2012   Mild chronotropic incompetence - read 82% of predicted; also reduced effort; peak VO2 15.7 / 75% (did not reach Max effort) -- suggested ischemic response in last 1.5 minutes of exercise. Normal pulmonary function on PFTs but poor response to  . DOPPLER ECHOCARDIOGRAPHY  11/20/2010   EF =>55%; LV norm mild aortic scelorosis  . FRACTURE SURGERY Right 03/19/2015   wrist  . HIATAL HERNIA REPAIR    . ILIAC ARTERY STENT  12/15/2005   PTA and direct stenting rgt and lft common iliac arteries  . NM MYOCAR PERF WALL MOTION  11/11/2010; February 2016   a) TM STRESS----NORMAL PERFUSION ,EF 68%; b) EF 50-55%. Normal LV function. No significant ischemia or infarction.  . ORIF WRIST FRACTURE Right 03/19/2015   Procedure: OPEN REDUCTION INTERNAL FIXATION (ORIF) WRIST FRACTURE;  Surgeon: Hessie Knows, MD;  Location: [facility] ORS;  Service: Orthopedics;  Laterality: Right;  . THORACIC AORTA - CAROTID ANGIOGRAM  October 2007   Dr. 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 TRANSTHORACIC ECHOCARDIOGRAM  February 2016   [facility]: Normal LV function. Dilated left atrium.    His family history includes Alcohol abuse in his father; Dementia in his mother; Ulcers in his mother.    Current Meds  Medication Sig  . ADVAIR DISKUS 250-50 MCG/DOSE AEPB INHALE 1 PUFF 2 TIMES A DAY  . albuterol (PROVENTIL HFA;VENTOLIN HFA) 108 (90 BASE) MCG/ACT inhaler Inhale 2 puffs into the lungs every 4 (four) hours as needed for wheezing or shortness of breath.  . ALPRAZolam (XANAX) 0.5 MG tablet Take 1 tablet (0.5 mg total) by mouth at bedtime as needed for anxiety.  [name] apixaban (ELIQUIS) 2.5 MG TABS tablet Take 2.5 mg by mouth 2  (two) times daily.   . Ascorbic Acid (VITAMIN C) 1000 MG tablet Take 1,000 mg by mouth daily.  . Biotin 1000 MCG tablet Take 1,000 mcg by mouth daily.  [name] DEXILANT 60 MG capsule TAKE 1 CAPSULE EVERY DAY  . lisinopril (PRINIVIL,ZESTRIL) 2.5 MG tablet TAKE ONE TABLET TWICE DAILY  . magnesium oxide (MAG-OX) 400 MG tablet Take 400 mg by mouth daily.  . metoprolol tartrate (LOPRESSOR) 25 MG tablet Take 1 tablet (25 mg total) by mouth 2 (two) times daily.  . mirabegron ER (MYRBETRIQ) 25 MG TB24 tablet Take 25 mg by mouth daily.  . Multiple Vitamins-Minerals (PRESERVISION AREDS) TABS Take 1 tablet by mouth 2 (two) times daily.  . nitroGLYCERIN (NITROSTAT) 0.4 MG SL tablet Place 1 tablet (0.4 mg total) under the tongue every  5 (five) minutes as needed for chest pain.  . rosuvastatin (CRESTOR) 20 MG tablet TAKE ONE TABLET EVERY DAY  . ZETIA 10 MG tablet TAKE ONE TABLET EVERY DAY    Patient Care Team: [name], MD as PCP - General (Family Medicine) Margarita Rana, MD as Referring Physician (Family Medicine) Leonie Man, MD as Consulting Physician (Cardiology) Kennieth Francois, MD (Dermatology) Vilinda Boehringer, MD as Consulting Physician (Internal Medicine)    Objective:   Vitals: BP 128/78 (BP Location: Right Arm, Patient Position: Sitting, Cuff Size: Large)   Pulse 71   Temp 97.4 F (36.3 C) (Oral)   Resp 16   Ht '5\' 11"'  (1.803 m)   Wt 223 lb (101.2 kg)   SpO2 99% Comment: room air  BMI 31.10 kg/m   Physical Exam   General Appearance:    Alert, cooperative, no distress  Eyes:    PERRL, conjunctiva/corneas clear, EOM's intact       Lungs:     Clear to auscultation bilaterally, respirations unlabored  Heart:     Irregularly irregular rhythm. Normal rate   Neurologic:   Awake, alert, oriented x 3. Positive rhomberg. Normal finger to nose. No other focal deficits.         Activities of Daily Living In your present state of health, do you have any difficulty performing the  following activities: 04/25/2016  Hearing? Y  Vision? Y  Difficulty concentrating or making decisions? Y  Walking or climbing stairs? Y  Dressing or bathing? N  Doing errands, shopping? N  Some recent data might be hidden    Fall Risk Assessment Fall Risk  04/25/2016 04/24/2015  Falls in the past year? Yes Yes  Number falls in past yr: 2 or more 1  Injury with Fall? Yes Yes  Risk for fall due to : Other (Comment) -  Follow up Falls prevention discussed -     Depression Screen PHQ 2/9 Scores 04/25/2016 04/24/2015  PHQ - 2 Score 0 0    Cognitive Testing - 6-CIT  Correct? Score   What year is it? yes 0 0 or 4  What month is it? yes 0 0 or 3  Memorize:    Pia Mau,  44,  [address]  [place],      What time is it? (within 1 hour) yes 0 0 or 3  Count backwards from 20 yes 0 0, 2, or 4  Name the months of the year yes 0 0, 2, or 4  Repeat name & address above no 4 0, 2, 4, 6, 8, or 10       TOTAL SCORE  4/28   Interpretation:  Normal  Normal (0-7) Abnormal (8-28)   Current Exercise Habits: Structured exercise class, Type of exercise: Other - see comments (Physical Therapy), Time (Minutes): 60, Frequency (Times/Week): 2, Weekly Exercise (Minutes/Week): 120, Intensity: Moderate Exercise limited by: Other - see comments (off balance)   Audit-C Alcohol Use Screening  Question Answer Points  How often do you have alcoholic drink? never 0  On days you do drink alcohol, how many drinks do you typically consume? n/a 0  How oftey will you drink 6 or more in a total? never 0  Total Score:  0   A score of 3 or more in women, and 4 or more in men indicates increased risk for alcohol abuse, EXCEPT if all of the points are from question 1.     Assessment & Plan:  Annual Wellness Visit  Reviewed patient's Family Medical History Reviewed and updated list of patient's medical providers Assessment of cognitive impairment was done Assessed patient's functional  ability Established a written schedule for health screening [place] Completed and Reviewed  Exercise Activities and Dietary recommendations Goals    . Exercise 150 minutes per week (moderate activity)       Immunization History  Administered Date(s) Administered  . Influenza,inj,Quad PF,36+ Mos 06/21/2015  . Influenza-Unspecified 07/02/2013, 06/15/2014  . Pneumococcal Conjugate-13 11/07/2013  . Pneumococcal-Unspecified 06/15/2010  . Td 03/18/2011    Health Maintenance  Topic Date Due  . ZOSTAVAX  10/13/1995  . INFLUENZA VACCINE  04/01/2016  . TETANUS/TDAP  03/17/2021  . PNA vac Low Risk Adult  Completed      Discussed health benefits of physical activity, and encouraged him to engage in regular exercise appropriate for his age and condition.    ------------------------------------------------------------------------------------------------------------  The entirety of the information documented in the History of Present Illness, Review of Systems and Physical Exam were personally obtained by me. Portions of this information were initially documented by Meyer Cory, CMA and reviewed by me for thoroughness and accuracy.   1. Medicare annual wellness visit, subsequent   2. Essential hypertension Well controlled.  Continue current medications.    3. Hyperlipidemia with target LDL less than 70 He is tolerating rosuvastatin and Zetia well with no adverse effects.    4. Hypothyroidism, unspecified hypothyroidism type  - T4 AND TSH  5. Leukocytosis  - CBC  6. Macular degeneration Continue routine follow up [facility]  7. Aortoiliac occlusive disease ([place])   8. Abnormality of aortic arch branch   9. Carotid artery occlusion without infarction, left Followed by vascular   10. Gastroesophageal reflux disease, esophagitis presence not specified Controlled on Dexilant.   11. Ischemic heart disease with chronotropic  incompetence Continue routine follow up cardiology.   Lelon Huh, MD  [facility]

## 2016-04-26 LAB — CBC
Hematocrit: 45.1 % (ref 37.5–51.0)
Hemoglobin: 15.4 g/dL (ref 12.6–17.7)
MCH: 31.2 pg (ref 26.6–33.0)
MCHC: 34.1 g/dL (ref 31.5–35.7)
MCV: 92 fL (ref 79–97)
Platelets: 148 10*3/uL — ABNORMAL LOW (ref 150–379)
RBC: 4.93 x10E6/uL (ref 4.14–5.80)
RDW: 15.5 % — ABNORMAL HIGH (ref 12.3–15.4)
WBC: 9.6 10*3/uL (ref 3.4–10.8)

## 2016-04-26 LAB — T4 AND TSH
T4, Total: 7.1 ug/dL (ref 4.5–12.0)
TSH: 3.51 u[IU]/mL (ref 0.450–4.500)

## 2016-04-28 ENCOUNTER — Ambulatory Visit: Payer: Medicare Other

## 2016-04-28 ENCOUNTER — Telehealth: Payer: Self-pay

## 2016-04-28 NOTE — Telephone Encounter (Signed)
-----   Message from [name], MD sent at 04/26/2016  7:29 AM EDT ----- CBC and thyroid functions are normal. Continue current medications.  Follow up yearly.

## 2016-04-28 NOTE — Telephone Encounter (Signed)
Patient advised as below. Patient verbalizes understanding and is in agreement with treatment plan.  

## 2016-04-30 ENCOUNTER — Encounter: Payer: Self-pay | Admitting: Physical Therapy

## 2016-04-30 ENCOUNTER — Ambulatory Visit: Payer: Medicare Other | Admitting: Physical Therapy

## 2016-04-30 DIAGNOSIS — R262 Difficulty in walking, not elsewhere classified: Secondary | ICD-10-CM

## 2016-04-30 DIAGNOSIS — M6281 Muscle weakness (generalized): Secondary | ICD-10-CM | POA: Diagnosis not present

## 2016-04-30 DIAGNOSIS — C44519 Basal cell carcinoma of skin of other part of trunk: Secondary | ICD-10-CM | POA: Diagnosis not present

## 2016-04-30 NOTE — Therapy (Signed)
[place] MAIN [facility] SERVICES [address] [place], [place], [address] Phone: [phone]   Fax:  [phone]  Physical Therapy Treatment  Patient Details  Name: SYLVAN SOOKDEO MRN: [id number] Date of Birth: 04/30/1934 Referring Provider: Duanne Guess  Encounter Date: 04/30/2016      PT End of Session - 04/30/16 0933    Visit Number 11   Number of Visits 17   Date for PT Re-Evaluation 05/15/16   Authorization Type 11   PT Start Time 0840   PT Stop Time 0920   PT Time Calculation (min) 40 min   Equipment Utilized During Treatment Gait belt   Activity Tolerance Patient tolerated treatment well      Past Medical History:  Diagnosis Date  . Basal cell carcinoma of eyelid 01/03/2013  . Cancer [facility])    Skin cancer  . Colon polyps   . Hemorrhoid 02/10/2015  . Memory loss   . Osteoporosis   . Sciatic pain    Chronic  . Temporary cerebral vascular dysfunction 02/10/2015   Had negative work-up.  July, 2012.  No medication changes, had forgot them that day, got dehydrated.     Past Surgical History:  Procedure Laterality Date  . ANKLE SURGERY    . Arch aortogram and carotid aortogram  06/02/2006   Dr Oneida Alar did surgery  . BASAL CELL CARCINOMA EXCISION  04/2016   Dermatology  . CARDIAC CATHETERIZATION  01/30/1998     total native LAD andRCA ,mod native CIRC; svg to RCA and OBTUSE MARG. PATENT the left internal mammary artery graft to LAD ; LV NORMAL  /  . CAROTID ENDARTERECTOMY Left Oct. 29, 2007   Dr. Oneida Alar:  . CORONARY ARTERY BYPASS GRAFT  1998   LIMA-LAD, SVG-OM, SVG-RPDA, SVG-DIAG  . CPET / MET  12/2012   Mild chronotropic incompetence - read 82% of predicted; also reduced effort; peak VO2 15.7 / 75% (did not reach Max effort) -- suggested ischemic response in last 1.5 minutes of exercise. Normal pulmonary function on PFTs but poor response to  . DOPPLER ECHOCARDIOGRAPHY  11/20/2010   EF =>55%; LV norm mild aortic scelorosis   . FRACTURE SURGERY Right 03/19/2015   wrist  . HIATAL HERNIA REPAIR    . ILIAC ARTERY STENT  12/15/2005   PTA and direct stenting rgt and lft common iliac arteries  . NM MYOCAR PERF WALL MOTION  11/11/2010; February 2016   a) TM STRESS----NORMAL PERFUSION ,EF 68%; b) EF 50-55%. Normal LV function. No significant ischemia or infarction.  . ORIF WRIST FRACTURE Right 03/19/2015   Procedure: OPEN REDUCTION INTERNAL FIXATION (ORIF) WRIST FRACTURE;  Surgeon: Hessie Knows, MD;  Location: [facility] ORS;  Service: Orthopedics;  Laterality: Right;  . THORACIC AORTA - CAROTID ANGIOGRAM  October 2007   Dr. 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 TRANSTHORACIC ECHOCARDIOGRAM  February 2016   [facility]: Normal LV function. Dilated left atrium.    There were no vitals filed for this visit.      Subjective Assessment - 04/30/16 0932    Subjective Patient is doing well today. reports no pain.   Currently in Pain? No/denies   Pain Score 0-No pain      NMR: Patient performed balance exercises using the balance master with tilt and surrounding challenges with limits of stability and target weight shifting with surrounding and feet movements.  Patient needs CGA and min assist  during treatment for loss of balance .CGA and Min to mod verbal cues used throughout with increased in postural sway and LOB most seen                          PT Education - 04/30/16 0932    Education provided Yes   Education Details heel toe gait   Person(s) Educated Patient   Methods Explanation   Comprehension Verbalized understanding             PT Long Term Goals - 04/22/16 1007      PT LONG TERM GOAL #1   Title Patient will be independent in home exercise program to improve strength/mobility for better functional independence with ADLs.   Time 8   Period Weeks   Status Achieved     PT LONG TERM GOAL #2    Title Patient (> 27 years old) will complete five times sit to stand test in < 15 seconds indicating an increased LE strength and improved balance.   Time 8   Period Weeks   Status Partially Met     PT LONG TERM GOAL #3   Title Patient will increase Berg Balance score by > 6 points to demonstrate decreased fall risk during functional activities.   Time 8   Period Weeks   Status Partially Met     PT LONG TERM GOAL #4   Title Patient will increase 10 meter walk test to >1.1ms as to improve gait speed for better community ambulation and to reduce fall risk.   Time 8   Period Weeks   Status Partially Met     PT LONG TERM GOAL #5   Title Patient will be independent with ascend/descend 12 steps using single UE in step over step pattern without LOB.   Time 8   Period Weeks   Status Partially Met               Plan - 04/30/16 0933    Clinical Impression Statement Patient was able to perform the balance master with tilt and surrounding challenges without a rest period.       Patient will benefit from skilled therapeutic intervention in order to improve the following deficits and impairments:     Visit Diagnosis: Difficulty in walking, not elsewhere classified  Muscle weakness (generalized)     Problem List Patient Active Problem List   Diagnosis Date Noted  . Hypothyroidism 04/25/2016  . Macular degeneration 04/25/2016  . Erectile dysfunction due to arterial insufficiency   . Ischemic heart disease with chronotropic incompetence   . CN (constipation) 02/10/2015  . DD (diverticular disease) 02/10/2015  . Accumulation of fluid in tissues 02/10/2015  . Elevated WBC count 02/10/2015  . Fatigue 02/10/2015  . Hypoxia 02/10/2015  . Lichen planus 016/03/3709 . Restless leg 02/10/2015  . 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 B12 deficiency 02/10/2015  . SOB (shortness of breath) 12/14/2014  . Lower extremity edema 12/14/2014  . COPD (chronic obstructive pulmonary  disease) ([place] 11/14/2014  . Post Inflammatory Lung Changes 11/14/2014  . Confusion state 11/08/2014  . Amnesia 11/08/2014  . PAF (paroxysmal atrial fibrillation) (HCC) CHA2DS2-VASc = 4. AC = Eliquis   . Insomnia 12/09/2013  . OSA on CPAP   . Erectile dysfunction   . Dyspnea on exertion - -essentially resolved with BB dose reduction & wgt loss 03/29/2013  . Overweight (BMI 25.0-29.9) -- Wgt back up 01/17/2013  . Atherosclerotic heart  disease of native coronary artery without angina pectoris - s/p CABG, occluded SVG-D1; patent LIMA-LAD, SVG-OM, SVG- RCA   . Essential hypertension   . Hyperlipidemia with target LDL less than 70   . Aorto-iliac disease ([place])   . Allergic rhinitis 08/17/2009  . Arthralgia of ankle or foot 06/04/2009  . Adaptation reaction 05/28/2009  . Benign prostatic hypertrophy without urinary obstruction 05/28/2009  . Acid reflux 05/28/2009  . Arthritis, degenerative 05/28/2009  . 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 Carotid artery occlusion without infarction 06/01/2006  . PAD (peripheral artery disease) - bilateral common iliac stents 12/15/2005  . Aortoiliac occlusive disease ([place]) 11/30/2005  . Atherosclerotic heart disease of artery bypass graft 09/01/1997   Alanson Puls, PT, DPT [place] [facility] 04/30/2016, 9:36 AM  [place] MAIN [facility] SERVICES [address] [place], [place], [address] Phone: [phone]   Fax:  [phone]  Name: ARCHIMEDES HAROLD MRN: [id number] Date of Birth: [birth year]

## 2016-05-06 ENCOUNTER — Encounter: Payer: Self-pay | Admitting: Physical Therapy

## 2016-05-06 ENCOUNTER — Ambulatory Visit: Payer: Medicare Other | Attending: Orthopedic Surgery | Admitting: Physical Therapy

## 2016-05-06 DIAGNOSIS — M6281 Muscle weakness (generalized): Secondary | ICD-10-CM | POA: Diagnosis not present

## 2016-05-06 DIAGNOSIS — R262 Difficulty in walking, not elsewhere classified: Secondary | ICD-10-CM | POA: Insufficient documentation

## 2016-05-06 NOTE — Therapy (Signed)
[place] MAIN [facility] SERVICES [address] [place], [place], [address] Phone: [phone]   Fax:  [phone]  Physical Therapy Treatment  Patient Details  Name: [name] MRN: [id number] Date of Birth: March 30, 1934 Referring Provider: Duanne Guess  Encounter Date: 05/06/2016      PT End of Session - 05/06/16 0855    Visit Number 12   Number of Visits 17   Date for PT Re-Evaluation 05/15/16   Authorization Type 11   PT Start Time 0830   PT Stop Time 0910   PT Time Calculation (min) 40 min   Equipment Utilized During Treatment Gait belt   Activity Tolerance Patient tolerated treatment well      Past Medical History:  Diagnosis Date  . Basal cell carcinoma of eyelid 01/03/2013  . Cancer [facility])    Skin cancer  . Colon polyps   . Hemorrhoid 02/10/2015  . Memory loss   . Osteoporosis   . Sciatic pain    Chronic  . Temporary cerebral vascular dysfunction 02/10/2015   Had negative work-up.  July, 2012.  No medication changes, had forgot them that day, got dehydrated.     Past Surgical History:  Procedure Laterality Date  . ANKLE SURGERY    . Arch aortogram and carotid aortogram  06/02/2006   Dr Oneida Alar did surgery  . BASAL CELL CARCINOMA EXCISION  04/2016   Dermatology  . CARDIAC CATHETERIZATION  01/30/1998     total native LAD andRCA ,mod native CIRC; svg to RCA and OBTUSE MARG. PATENT the left internal mammary artery graft to LAD ; LV NORMAL  /  . CAROTID ENDARTERECTOMY Left Oct. 29, 2007   Dr. Oneida Alar:  . CORONARY ARTERY BYPASS GRAFT  1998   LIMA-LAD, SVG-OM, SVG-RPDA, SVG-DIAG  . CPET / MET  12/2012   Mild chronotropic incompetence - read 82% of predicted; also reduced effort; peak VO2 15.7 / 75% (did not reach Max effort) -- suggested ischemic response in last 1.5 minutes of exercise. Normal pulmonary function on PFTs but poor response to  . DOPPLER ECHOCARDIOGRAPHY  11/20/2010   EF =>55%; LV norm mild aortic scelorosis   . FRACTURE SURGERY Right 03/19/2015   wrist  . HIATAL HERNIA REPAIR    . ILIAC ARTERY STENT  12/15/2005   PTA and direct stenting rgt and lft common iliac arteries  . NM MYOCAR PERF WALL MOTION  11/11/2010; February 2016   a) TM STRESS----NORMAL PERFUSION ,EF 68%; b) EF 50-55%. Normal LV function. No significant ischemia or infarction.  . ORIF WRIST FRACTURE Right 03/19/2015   Procedure: OPEN REDUCTION INTERNAL FIXATION (ORIF) WRIST FRACTURE;  Surgeon: Hessie Knows, MD;  Location: [facility] ORS;  Service: Orthopedics;  Laterality: Right;  . THORACIC AORTA - CAROTID ANGIOGRAM  October 2007   Dr. 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 TRANSTHORACIC ECHOCARDIOGRAM  February 2016   [facility]: Normal LV function. Dilated left atrium.    There were no vitals filed for this visit.      Subjective Assessment - 05/06/16 0855    Subjective Patient is doing well today. reports no pain.   Currently in Pain? No/denies        In // bars: Standing march 2x10 Fwd/retro walking with 1UE x 3 laps Side steps x 3 laps   Standing balance no UE 5x30s Standing hip abd 2x10 Standing hip ext 2x10 Standing ankle PF/ PF 2x10  LAQ 2x10 Pt requires min verbal and tactile cues for proper exercise performance   NMR: Eyes closed with foam standing and NBOS  side stepping with matrix 12. 5 lbs x 10 each direction Patient needs more assist with eyes closed in parallel bars and has difficulty with static standing                       PT Education - 05/06/16 0855    Education provided Yes   Education Details heel toe gait   Person(s) Educated Patient   Comprehension Verbalized understanding             PT Long Term Goals - 04/22/16 1007      PT LONG TERM GOAL #1   Title Patient will be independent in home exercise program to improve strength/mobility for better functional independence with ADLs.    Time 8   Period Weeks   Status Achieved     PT LONG TERM GOAL #2   Title Patient (> 6 years old) will complete five times sit to stand test in < 15 seconds indicating an increased LE strength and improved balance.   Time 8   Period Weeks   Status Partially Met     PT LONG TERM GOAL #3   Title Patient will increase Berg Balance score by > 6 points to demonstrate decreased fall risk during functional activities.   Time 8   Period Weeks   Status Partially Met     PT LONG TERM GOAL #4   Title Patient will increase 10 meter walk test to >1.31ms as to improve gait speed for better community ambulation and to reduce fall risk.   Time 8   Period Weeks   Status Partially Met     PT LONG TERM GOAL #5   Title Patient will be independent with ascend/descend 12 steps using single UE in step over step pattern without LOB.   Time 8   Period Weeks   Status Partially Met               Plan - 05/06/16 0856    Clinical Impression Statement Min cuing needed to maintain posture while performing strengthening tasks. Good form was demonstrated throughout exercises.   Clinical Impairments Affecting Rehab Potential recent shoulder fracture and wrist,    PT Frequency 2x / week   PT Duration 8 weeks   PT Treatment/Interventions Balance training;Therapeutic exercise;Therapeutic activities;Functional mobility training;Gait training;Neuromuscular re-education   PT Next Visit Plan balance and strength training   PT Home Exercise Plan corner in modified tandem standing    Consulted and Agree with Plan of Care Patient      Patient will benefit from skilled therapeutic intervention in order to improve the following deficits and impairments:  Abnormal gait, Pain, Decreased mobility, Decreased strength, Decreased activity tolerance, Decreased balance, Difficulty walking, Impaired flexibility  Visit Diagnosis: Difficulty in walking, not elsewhere classified  Muscle weakness  (generalized)     Problem List Patient Active Problem List   Diagnosis Date Noted  . Hypothyroidism 04/25/2016  . Macular degeneration 04/25/2016  . Erectile dysfunction due to arterial insufficiency   . Ischemic heart disease with chronotropic incompetence   . CN (constipation) 02/10/2015  . DD (diverticular disease) 02/10/2015  . Accumulation of fluid in tissues 02/10/2015  . Elevated WBC count 02/10/2015  . Fatigue 02/10/2015  . Hypoxia 02/10/2015  . Lichen planus 064/33/2951 . Restless leg 02/10/2015  . 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  B12 deficiency 02/10/2015  . SOB (shortness of breath) 12/14/2014  . Lower extremity edema 12/14/2014  . COPD (chronic obstructive pulmonary disease) ([place]) 11/14/2014  . Post Inflammatory Lung Changes 11/14/2014  . Confusion state 11/08/2014  . Amnesia 11/08/2014  . PAF (paroxysmal atrial fibrillation) (HCC) CHA2DS2-VASc = 4. AC = Eliquis   . Insomnia 12/09/2013  . OSA on CPAP   . Erectile dysfunction   . Dyspnea on exertion - -essentially resolved with BB dose reduction & wgt loss 03/29/2013  . Overweight (BMI 25.0-29.9) -- Wgt back up 01/17/2013  . Atherosclerotic heart disease of native coronary artery without angina pectoris - s/p CABG, occluded SVG-D1; patent LIMA-LAD, SVG-OM, SVG- RCA   . Essential hypertension   . Hyperlipidemia with target LDL less than 70   . Aorto-iliac disease ([place])   . Allergic rhinitis 08/17/2009  . Arthralgia of ankle or foot 06/04/2009  . Adaptation reaction 05/28/2009  . Benign prostatic hypertrophy without urinary obstruction 05/28/2009  . Acid reflux 05/28/2009  . Arthritis, degenerative 05/28/2009  . 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 Carotid artery occlusion without infarction 06/01/2006  . PAD (peripheral artery disease) - bilateral common iliac stents 12/15/2005  . Aortoiliac occlusive disease ([place]) 11/30/2005  . Atherosclerotic heart disease of artery bypass graft 09/01/1997    Alanson Puls, PT, DPT [place] [facility] 05/06/2016, 8:58 AM  [place] MAIN [facility] SERVICES [address] [place], [place], [address] Phone: [phone]   Fax:  [phone]  Name: [name] MRN: [id number] Date of Birth: 23-Jun-1934

## 2016-05-08 ENCOUNTER — Ambulatory Visit: Payer: Medicare Other | Admitting: Physical Therapy

## 2016-05-08 DIAGNOSIS — M6281 Muscle weakness (generalized): Secondary | ICD-10-CM | POA: Diagnosis not present

## 2016-05-08 DIAGNOSIS — R262 Difficulty in walking, not elsewhere classified: Secondary | ICD-10-CM | POA: Diagnosis not present

## 2016-05-08 NOTE — Therapy (Signed)
[place] MAIN [facility] SERVICES [address] [place], [place], [address] Phone: [phone]   Fax:  [phone]  Physical Therapy Treatment  Patient Details  Name: [name] MRN: [id number] Date of Birth: [birth year] Referring Provider: Duanne Guess  Encounter Date: 05/08/2016      PT End of Session - 05/08/16 0858    Visit Number 13   Number of Visits 17   Date for PT Re-Evaluation 05/15/16   Authorization Type 13   PT Start Time 0830   PT Stop Time 0910   PT Time Calculation (min) 40 min   Equipment Utilized During Treatment Gait belt   Activity Tolerance Patient tolerated treatment well      Past Medical History:  Diagnosis Date  . Basal cell carcinoma of eyelid 01/03/2013  . Cancer [facility])    Skin cancer  . Colon polyps   . Hemorrhoid 02/10/2015  . Memory loss   . Osteoporosis   . Sciatic pain    Chronic  . Temporary cerebral vascular dysfunction 02/10/2015   Had negative work-up.  July, 2012.  No medication changes, had forgot them that day, got dehydrated.     Past Surgical History:  Procedure Laterality Date  . ANKLE SURGERY    . Arch aortogram and carotid aortogram  06/02/2006   Dr Oneida Alar did surgery  . BASAL CELL CARCINOMA EXCISION  04/2016   Dermatology  . CARDIAC CATHETERIZATION  01/30/1998     total native LAD andRCA ,mod native CIRC; svg to RCA and OBTUSE MARG. PATENT the left internal mammary artery graft to LAD ; LV NORMAL  /  . CAROTID ENDARTERECTOMY Left Oct. 29, 2007   Dr. Oneida Alar:  . CORONARY ARTERY BYPASS GRAFT  1998   LIMA-LAD, SVG-OM, SVG-RPDA, SVG-DIAG  . CPET / MET  12/2012   Mild chronotropic incompetence - read 82% of predicted; also reduced effort; peak VO2 15.7 / 75% (did not reach Max effort) -- suggested ischemic response in last 1.5 minutes of exercise. Normal pulmonary function on PFTs but poor response to  . DOPPLER ECHOCARDIOGRAPHY  11/20/2010   EF =>55%; LV norm mild aortic scelorosis   . FRACTURE SURGERY Right 03/19/2015   wrist  . HIATAL HERNIA REPAIR    . ILIAC ARTERY STENT  12/15/2005   PTA and direct stenting rgt and lft common iliac arteries  . NM MYOCAR PERF WALL MOTION  11/11/2010; February 2016   a) TM STRESS----NORMAL PERFUSION ,EF 68%; b) EF 50-55%. Normal LV function. No significant ischemia or infarction.  . ORIF WRIST FRACTURE Right 03/19/2015   Procedure: OPEN REDUCTION INTERNAL FIXATION (ORIF) WRIST FRACTURE;  Surgeon: Hessie Knows, MD;  Location: [facility] ORS;  Service: Orthopedics;  Laterality: Right;  . THORACIC AORTA - CAROTID ANGIOGRAM  October 2007   Dr. 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 TRANSTHORACIC ECHOCARDIOGRAM  February 2016   [facility]: Normal LV function. Dilated left atrium.    There were no vitals filed for this visit.      Subjective Assessment - 05/08/16 0857    Subjective Patient is doing well today. reports no pain.   Currently in Pain? No/denies        Therapeutic exercise and neuromuscular training: 1/2 foam flat side up and balance with head turns left and right feet apart and feet together,  tandem standing on 1/2 foam  x 2 minutes and horizontal foot placement x  2 minutes standing hip abd with YTB x 20   side stepping left and right in parallel bars 10 feet x 3 step ups from floor to 6 inch stool x 20 bilateral marching in parallel bars x 20 Tilt board fwd/bwd, side to side left and right Tm walking 1. 5 m/hour x 5 mins TM walking side stepping left and right x . 4 miles / hour Patient needs occasional verbal cueing to improve posture and cueing to correctly perform exercises slowly, holding at end of range to increase motor firing of desired muscle to encourage fatigue                          PT Education - 05/08/16 0857    Education provided Yes   Education Details stepping high with curbs   Person(s)  Educated Patient   Methods Explanation   Comprehension Verbalized understanding             PT Long Term Goals - 04/22/16 1007      PT LONG TERM GOAL #1   Title Patient will be independent in home exercise program to improve strength/mobility for better functional independence with ADLs.   Time 8   Period Weeks   Status Achieved     PT LONG TERM GOAL #2   Title Patient (> 59 years old) will complete five times sit to stand test in < 15 seconds indicating an increased LE strength and improved balance.   Time 8   Period Weeks   Status Partially Met     PT LONG TERM GOAL #3   Title Patient will increase Berg Balance score by > 6 points to demonstrate decreased fall risk during functional activities.   Time 8   Period Weeks   Status Partially Met     PT LONG TERM GOAL #4   Title Patient will increase 10 meter walk test to >1.27ms as to improve gait speed for better community ambulation and to reduce fall risk.   Time 8   Period Weeks   Status Partially Met     PT LONG TERM GOAL #5   Title Patient will be independent with ascend/descend 12 steps using single UE in step over step pattern without LOB.   Time 8   Period Weeks   Status Partially Met               Plan - 05/08/16 0859    Clinical Impression Statement Pt demonstrates a step-to gait while ambulating stairs leading with LLE.   Clinical Impairments Affecting Rehab Potential recent shoulder fracture and wrist,    PT Frequency 2x / week   PT Duration 8 weeks   PT Treatment/Interventions Balance training;Therapeutic exercise;Therapeutic activities;Functional mobility training;Gait training;Neuromuscular re-education   PT Next Visit Plan balance and strength training   PT Home Exercise Plan corner in modified tandem standing    Consulted and Agree with Plan of Care Patient      Patient will benefit from skilled therapeutic intervention in order to improve the following deficits and impairments:  Abnormal  gait, Pain, Decreased mobility, Decreased strength, Decreased activity tolerance, Decreased balance, Difficulty walking, Impaired flexibility  Visit Diagnosis: Difficulty in walking, not elsewhere classified  Muscle weakness (generalized)     Problem List Patient Active Problem List   Diagnosis Date Noted  . Hypothyroidism 04/25/2016  . Macular degeneration 04/25/2016  . Erectile dysfunction due to arterial insufficiency   . Ischemic heart disease with  chronotropic incompetence   . CN (constipation) 02/10/2015  . DD (diverticular disease) 02/10/2015  . Accumulation of fluid in tissues 02/10/2015  . Elevated WBC count 02/10/2015  . Fatigue 02/10/2015  . Hypoxia 02/10/2015  . Lichen planus 40/37/5436  . Restless leg 02/10/2015  . 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 B12 deficiency 02/10/2015  . SOB (shortness of breath) 12/14/2014  . Lower extremity edema 12/14/2014  . COPD (chronic obstructive pulmonary disease) ([place]) 11/14/2014  . Post Inflammatory Lung Changes 11/14/2014  . Confusion state 11/08/2014  . Amnesia 11/08/2014  . PAF (paroxysmal atrial fibrillation) (HCC) CHA2DS2-VASc = 4. AC = Eliquis   . Insomnia 12/09/2013  . OSA on CPAP   . Erectile dysfunction   . Dyspnea on exertion - -essentially resolved with BB dose reduction & wgt loss 03/29/2013  . Overweight (BMI 25.0-29.9) -- Wgt back up 01/17/2013  . Atherosclerotic heart disease of native coronary artery without angina pectoris - s/p CABG, occluded SVG-D1; patent LIMA-LAD, SVG-OM, SVG- RCA   . Essential hypertension   . Hyperlipidemia with target LDL less than 70   . Aorto-iliac disease ([place])   . Allergic rhinitis 08/17/2009  . Arthralgia of ankle or foot 06/04/2009  . Adaptation reaction 05/28/2009  . Benign prostatic hypertrophy without urinary obstruction 05/28/2009  . Acid reflux 05/28/2009  . Arthritis, degenerative 05/28/2009  . 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 Carotid artery  occlusion without infarction 06/01/2006  . PAD (peripheral artery disease) - bilateral common iliac stents 12/15/2005  . Aortoiliac occlusive disease ([place]) 11/30/2005  . Atherosclerotic heart disease of artery bypass graft 09/01/1997   Alanson Puls, PT, DPT [place] S 05/08/2016, 9:05 AM  [place] MAIN [facility] SERVICES [address] [place], [place], [address] Phone: [phone]   Fax:  [phone]  Name: [name] MRN: [id number] Date of Birth: Feb 19, 1934

## 2016-05-13 ENCOUNTER — Ambulatory Visit: Payer: Medicare Other | Admitting: Physical Therapy

## 2016-05-13 ENCOUNTER — Encounter: Payer: Self-pay | Admitting: Physical Therapy

## 2016-05-13 DIAGNOSIS — R262 Difficulty in walking, not elsewhere classified: Secondary | ICD-10-CM | POA: Diagnosis not present

## 2016-05-13 DIAGNOSIS — M6281 Muscle weakness (generalized): Secondary | ICD-10-CM

## 2016-05-13 NOTE — Therapy (Signed)
[place] MAIN [facility] SERVICES [address] [place], [place], [address] Phone: [phone]   Fax:  [phone]  Physical Therapy Treatment  Patient Details  Name: [name] MRN: [id number] Date of Birth: 03/17/1934 Referring Provider: Duanne Guess  Encounter Date: 05/13/2016      PT End of Session - 05/13/16 0903    Visit Number 14   Number of Visits 17   Date for PT Re-Evaluation 05/15/16   Authorization Type 13   PT Start Time 0832   PT Stop Time 0910   PT Time Calculation (min) 38 min   Equipment Utilized During Treatment Gait belt   Activity Tolerance Patient tolerated treatment well      Past Medical History:  Diagnosis Date  . Basal cell carcinoma of eyelid 01/03/2013  . Cancer [facility])    Skin cancer  . Colon polyps   . Hemorrhoid 02/10/2015  . Memory loss   . Osteoporosis   . Sciatic pain    Chronic  . Temporary cerebral vascular dysfunction 02/10/2015   Had negative work-up.  July, 2012.  No medication changes, had forgot them that day, got dehydrated.     Past Surgical History:  Procedure Laterality Date  . ANKLE SURGERY    . Arch aortogram and carotid aortogram  06/02/2006   Dr Oneida Alar did surgery  . BASAL CELL CARCINOMA EXCISION  04/2016   Dermatology  . CARDIAC CATHETERIZATION  01/30/1998     total native LAD andRCA ,mod native CIRC; svg to RCA and OBTUSE MARG. PATENT the left internal mammary artery graft to LAD ; LV NORMAL  /  . CAROTID ENDARTERECTOMY Left Oct. 29, 2007   Dr. Oneida Alar:  . CORONARY ARTERY BYPASS GRAFT  1998   LIMA-LAD, SVG-OM, SVG-RPDA, SVG-DIAG  . CPET / MET  12/2012   Mild chronotropic incompetence - read 82% of predicted; also reduced effort; peak VO2 15.7 / 75% (did not reach Max effort) -- suggested ischemic response in last 1.5 minutes of exercise. Normal pulmonary function on PFTs but poor response to  . DOPPLER ECHOCARDIOGRAPHY  11/20/2010   EF =>55%; LV norm mild aortic scelorosis   . FRACTURE SURGERY Right 03/19/2015   wrist  . HIATAL HERNIA REPAIR    . ILIAC ARTERY STENT  12/15/2005   PTA and direct stenting rgt and lft common iliac arteries  . NM MYOCAR PERF WALL MOTION  11/11/2010; February 2016   a) TM STRESS----NORMAL PERFUSION ,EF 68%; b) EF 50-55%. Normal LV function. No significant ischemia or infarction.  . ORIF WRIST FRACTURE Right 03/19/2015   Procedure: OPEN REDUCTION INTERNAL FIXATION (ORIF) WRIST FRACTURE;  Surgeon: Hessie Knows, MD;  Location: [facility] ORS;  Service: Orthopedics;  Laterality: Right;  . THORACIC AORTA - CAROTID ANGIOGRAM  October 2007   Dr. 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 TRANSTHORACIC ECHOCARDIOGRAM  February 2016   [facility]: Normal LV function. Dilated left atrium.    There were no vitals filed for this visit.      Subjective Assessment - 05/13/16 0902    Subjective Patient is doing well today. reports no pain. He said that he had a busy weekend and feels tired but that his balance is improving.      Therex:  Standing march 2x10 Fwd/retro walking with 1UE x 3 laps Side steps x 3 laps   Standing balance no UE 5x30s  1/2 foam flat side  up and balance with head turns left and right feet apart and feet together,  tandem standing on 1/2 foam   standing hip abd with YTB x 20   side stepping left and right in parallel bars 10 feet x 3 step ups from floor to 6 inch stool x 20 bilateral marching in parallel bars x 20 Tilt board fwd/bwd, side to side left and right Tm walking 1. 5 m/hour x 5 mins TM walking side stepping left and right x . 4 miles / hour Pt requires min verbal and tactile cues for proper exercise performance                           PT Education - 05/13/16 0903    Education provided Yes   Education Details higher step length   Person(s) Educated Patient   Methods Explanation   Comprehension  Verbalized understanding             PT Long Term Goals - 04/22/16 1007      PT LONG TERM GOAL #1   Title Patient will be independent in home exercise program to improve strength/mobility for better functional independence with ADLs.   Time 8   Period Weeks   Status Achieved     PT LONG TERM GOAL #2   Title Patient (> 34 years old) will complete five times sit to stand test in < 15 seconds indicating an increased LE strength and improved balance.   Time 8   Period Weeks   Status Partially Met     PT LONG TERM GOAL #3   Title Patient will increase Berg Balance score by > 6 points to demonstrate decreased fall risk during functional activities.   Time 8   Period Weeks   Status Partially Met     PT LONG TERM GOAL #4   Title Patient will increase 10 meter walk test to >1.94ms as to improve gait speed for better community ambulation and to reduce fall risk.   Time 8   Period Weeks   Status Partially Met     PT LONG TERM GOAL #5   Title Patient will be independent with ascend/descend 12 steps using single UE in step over step pattern without LOB.   Time 8   Period Weeks   Status Partially Met               Plan - 05/13/16 0904    Clinical Impression Statement  Pt. demonstrated good control and understanding with standing hip 3-way. Minimal cueing needed to point toe forward and keep knee straight with task. SBA needed throughout.    Rehab Potential Good   Clinical Impairments Affecting Rehab Potential recent shoulder fracture and wrist,    PT Frequency 2x / week   PT Duration 8 weeks   PT Treatment/Interventions Balance training;Therapeutic exercise;Therapeutic activities;Functional mobility training;Gait training;Neuromuscular re-education   PT Next Visit Plan balance and strength training   PT Home Exercise Plan corner in modified tandem standing    Consulted and Agree with Plan of Care Patient      Patient will benefit from skilled therapeutic intervention  in order to improve the following deficits and impairments:  Abnormal gait, Pain, Decreased mobility, Decreased strength, Decreased activity tolerance, Decreased balance, Difficulty walking, Impaired flexibility  Visit Diagnosis: Difficulty in walking, not elsewhere classified  Muscle weakness (generalized)     Problem List Patient Active Problem List   Diagnosis Date Noted  .  Hypothyroidism 04/25/2016  . Macular degeneration 04/25/2016  . Erectile dysfunction due to arterial insufficiency   . Ischemic heart disease with chronotropic incompetence   . CN (constipation) 02/10/2015  . DD (diverticular disease) 02/10/2015  . Accumulation of fluid in tissues 02/10/2015  . Elevated WBC count 02/10/2015  . Fatigue 02/10/2015  . Hypoxia 02/10/2015  . Lichen planus 14/23/9532  . Restless leg 02/10/2015  . 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 B12 deficiency 02/10/2015  . SOB (shortness of breath) 12/14/2014  . Lower extremity edema 12/14/2014  . COPD (chronic obstructive pulmonary disease) ([place]) 11/14/2014  . Post Inflammatory Lung Changes 11/14/2014  . Confusion state 11/08/2014  . Amnesia 11/08/2014  . PAF (paroxysmal atrial fibrillation) (HCC) CHA2DS2-VASc = 4. AC = Eliquis   . Insomnia 12/09/2013  . OSA on CPAP   . Erectile dysfunction   . Dyspnea on exertion - -essentially resolved with BB dose reduction & wgt loss 03/29/2013  . Overweight (BMI 25.0-29.9) -- Wgt back up 01/17/2013  . Atherosclerotic heart disease of native coronary artery without angina pectoris - s/p CABG, occluded SVG-D1; patent LIMA-LAD, SVG-OM, SVG- RCA   . Essential hypertension   . Hyperlipidemia with target LDL less than 70   . Aorto-iliac disease ([place])   . Allergic rhinitis 08/17/2009  . Arthralgia of ankle or foot 06/04/2009  . Adaptation reaction 05/28/2009  . Benign prostatic hypertrophy without urinary obstruction 05/28/2009  . Acid reflux 05/28/2009  . Arthritis, degenerative 05/28/2009  .  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 Carotid artery occlusion without infarction 06/01/2006  . PAD (peripheral artery disease) - bilateral common iliac stents 12/15/2005  . Aortoiliac occlusive disease ([place]) 11/30/2005  . Atherosclerotic heart disease of artery bypass graft 09/01/1997    Alanson Puls 05/13/2016, 9:07 AM  [place] MAIN [facility] SERVICES [address] [place], [place], [address] Phone: [phone]   Fax:  [phone]  Name: [name] MRN: [id number] Date of Birth: [birth year]

## 2016-05-15 NOTE — Addendum Note (Signed)
Addended by: Alanson Puls on: 05/15/2016 11:25 AM   Modules accepted: Orders

## 2016-05-19 ENCOUNTER — Ambulatory Visit: Payer: Medicare Other | Admitting: Physical Therapy

## 2016-05-19 ENCOUNTER — Encounter: Payer: Self-pay | Admitting: Physical Therapy

## 2016-05-19 DIAGNOSIS — R262 Difficulty in walking, not elsewhere classified: Secondary | ICD-10-CM

## 2016-05-19 DIAGNOSIS — M6281 Muscle weakness (generalized): Secondary | ICD-10-CM | POA: Diagnosis not present

## 2016-05-19 NOTE — Therapy (Signed)
[place] MAIN [facility] SERVICES [address] [place], [place], [address] Phone: [phone]   Fax:  [phone]  Physical Therapy Treatment  Patient Details  Name: [name] MRN: [id number] Date of Birth: 05/26/1934 Referring Provider: Duanne Guess  Encounter Date: 05/19/2016      PT End of Session - 05/19/16 0834    Visit Number 15   Number of Visits 17   Date for PT Re-Evaluation 05/15/16   Authorization Type 5/10   Equipment Utilized During Treatment Gait belt   Activity Tolerance Patient tolerated treatment well      Past Medical History:  Diagnosis Date  . Basal cell carcinoma of eyelid 01/03/2013  . Cancer [facility])    Skin cancer  . Colon polyps   . Hemorrhoid 02/10/2015  . Memory loss   . Osteoporosis   . Sciatic pain    Chronic  . Temporary cerebral vascular dysfunction 02/10/2015   Had negative work-up.  July, 2012.  No medication changes, had forgot them that day, got dehydrated.     Past Surgical History:  Procedure Laterality Date  . ANKLE SURGERY    . Arch aortogram and carotid aortogram  06/02/2006   Dr Oneida Alar did surgery  . BASAL CELL CARCINOMA EXCISION  04/2016   Dermatology  . CARDIAC CATHETERIZATION  01/30/1998     total native LAD andRCA ,mod native CIRC; svg to RCA and OBTUSE MARG. PATENT the left internal mammary artery graft to LAD ; LV NORMAL  /  . CAROTID ENDARTERECTOMY Left Oct. 29, 2007   Dr. Oneida Alar:  . CORONARY ARTERY BYPASS GRAFT  1998   LIMA-LAD, SVG-OM, SVG-RPDA, SVG-DIAG  . CPET / MET  12/2012   Mild chronotropic incompetence - read 82% of predicted; also reduced effort; peak VO2 15.7 / 75% (did not reach Max effort) -- suggested ischemic response in last 1.5 minutes of exercise. Normal pulmonary function on PFTs but poor response to  . DOPPLER ECHOCARDIOGRAPHY  11/20/2010   EF =>55%; LV norm mild aortic scelorosis  . FRACTURE SURGERY Right 03/19/2015   wrist  . HIATAL HERNIA REPAIR    .  ILIAC ARTERY STENT  12/15/2005   PTA and direct stenting rgt and lft common iliac arteries  . NM MYOCAR PERF WALL MOTION  11/11/2010; February 2016   a) TM STRESS----NORMAL PERFUSION ,EF 68%; b) EF 50-55%. Normal LV function. No significant ischemia or infarction.  . ORIF WRIST FRACTURE Right 03/19/2015   Procedure: OPEN REDUCTION INTERNAL FIXATION (ORIF) WRIST FRACTURE;  Surgeon: Hessie Knows, MD;  Location: [facility] ORS;  Service: Orthopedics;  Laterality: Right;  . THORACIC AORTA - CAROTID ANGIOGRAM  October 2007   Dr. 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 TRANSTHORACIC ECHOCARDIOGRAM  February 2016   [facility]: Normal LV function. Dilated left atrium.    There were no vitals filed for this visit.      Subjective Assessment - 05/19/16 0833    Subjective Patient is doing well today. reports no pain. He said that he had a busy weekend and feels tired but that his balance is improving.   Currently in Pain? No/denies   Pain Score 0-No pain   Multiple Pain Sites No        Therapeutic exercise and neuromuscular training: 1/2 foam flat side up and balance with head turns left and right feet apart and feet together,  tandem standing on 1/2 foam  Rocker board fwd/ bwd and side to side  Matrix x 5 side stepping left and right step ups from floor to 6 inch stool x 20 bilateral Blue balance beam side stepping x 10 x 5 Tm walking 1. 5 m/hour x 10 mins  Patient needs occasional verbal cueing to improve posture and cueing to correctly perform exercises slowly, holding at end of range to increase motor firing of desired muscle to encourage fatigue.                          PT Education - 05/19/16 [phone]    Education provided Yes   Education Details working out at the senior center   [employer]) Educated Patient   Methods Explanation   Comprehension Verbalized understanding              PT Long Term Goals - 05/15/16 1122      PT LONG TERM GOAL #1   Title Patient will be independent in home exercise program to improve strength/mobility for better functional independence with ADLs.   Time 4   Status Achieved     PT LONG TERM GOAL #2   Time 4   Period Weeks   Status Partially Met     PT LONG TERM GOAL #3   Title Patient will increase Berg Balance score by > 6 points to demonstrate decreased fall risk during functional activities.   Time 4   Period Weeks   Status Partially Met     PT LONG TERM GOAL #4   Title Patient will increase 10 meter walk test to >1.52ms as to improve gait speed for better community ambulation and to reduce fall risk.   Time 4   Period Weeks   Status Partially Met     PT LONG TERM GOAL #5   Title Patient will be independent with ascend/descend 12 steps using single UE in step over step pattern without LOB.   Time 4   Period Weeks   Status Partially Met               Plan - 05/19/16 0834    Clinical Impression Statement Pt needed mod cueing when performing side stepping balance on matrix. He needs occasisonaly posture    Rehab Potential Good   Clinical Impairments Affecting Rehab Potential recent shoulder fracture and wrist,    PT Frequency 2x / week   PT Duration 8 weeks   PT Treatment/Interventions Balance training;Therapeutic exercise;Therapeutic activities;Functional mobility training;Gait training;Neuromuscular re-education   PT Next Visit Plan balance and strength training   PT Home Exercise Plan corner in modified tandem standing    Consulted and Agree with Plan of Care Patient      Patient will benefit from skilled therapeutic intervention in order to improve the following deficits and impairments:  Abnormal gait, Pain, Decreased mobility, Decreased strength, Decreased activity tolerance, Decreased balance, Difficulty walking, Impaired flexibility  Visit Diagnosis: Difficulty in walking, not elsewhere  classified  Muscle weakness (generalized)     Problem List Patient Active Problem List   Diagnosis Date Noted  . Hypothyroidism 04/25/2016  . Macular degeneration 04/25/2016  . Erectile dysfunction due to arterial insufficiency   . Ischemic heart disease with chronotropic incompetence   . CN (constipation) 02/10/2015  . DD (diverticular disease) 02/10/2015  . Accumulation of fluid in tissues 02/10/2015  . Elevated WBC count 02/10/2015  . Fatigue 02/10/2015  . Hypoxia 02/10/2015  . Lichen planus 053/74/8270 . Restless  leg 02/10/2015  . 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 B12 deficiency 02/10/2015  . SOB (shortness of breath) 12/14/2014  . Lower extremity edema 12/14/2014  . COPD (chronic obstructive pulmonary disease) ([place]) 11/14/2014  . Post Inflammatory Lung Changes 11/14/2014  . Confusion state 11/08/2014  . Amnesia 11/08/2014  . PAF (paroxysmal atrial fibrillation) (HCC) CHA2DS2-VASc = 4. AC = Eliquis   . Insomnia 12/09/2013  . OSA on CPAP   . Erectile dysfunction   . Dyspnea on exertion - -essentially resolved with BB dose reduction & wgt loss 03/29/2013  . Overweight (BMI 25.0-29.9) -- Wgt back up 01/17/2013  . Atherosclerotic heart disease of native coronary artery without angina pectoris - s/p CABG, occluded SVG-D1; patent LIMA-LAD, SVG-OM, SVG- RCA   . Essential hypertension   . Hyperlipidemia with target LDL less than 70   . Aorto-iliac disease ([place])   . Allergic rhinitis 08/17/2009  . Arthralgia of ankle or foot 06/04/2009  . Adaptation reaction 05/28/2009  . Benign prostatic hypertrophy without urinary obstruction 05/28/2009  . Acid reflux 05/28/2009  . Arthritis, degenerative 05/28/2009  . 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 Carotid artery occlusion without infarction 06/01/2006  . PAD (peripheral artery disease) - bilateral common iliac stents 12/15/2005  . Aortoiliac occlusive disease ([place]) 11/30/2005  . Atherosclerotic heart disease of  artery bypass graft 09/01/1997    Alanson Puls 05/19/2016, 9:08 AM  [place] MAIN [facility] SERVICES [address] [place], [place], [address] Phone: [phone]   Fax:  [phone]  Name: [name] MRN: [id number] Date of Birth: 11-30-1933

## 2016-05-21 ENCOUNTER — Other Ambulatory Visit: Payer: Self-pay | Admitting: Cardiology

## 2016-05-21 ENCOUNTER — Other Ambulatory Visit: Payer: Self-pay | Admitting: Internal Medicine

## 2016-05-21 ENCOUNTER — Ambulatory Visit: Payer: Medicare Other | Admitting: Physical Therapy

## 2016-05-21 NOTE — Telephone Encounter (Signed)
I'm not sure why he was on 2.5 mg tablets in the past, I think he probably should be on 5 mg based on his weight and age/renal function.  [name], MD

## 2016-05-22 ENCOUNTER — Other Ambulatory Visit: Payer: Self-pay | Admitting: *Deleted

## 2016-05-26 ENCOUNTER — Ambulatory Visit: Payer: Medicare Other | Admitting: Physical Therapy

## 2016-05-26 DIAGNOSIS — R0902 Hypoxemia: Secondary | ICD-10-CM | POA: Diagnosis not present

## 2016-05-26 DIAGNOSIS — S81801A Unspecified open wound, right lower leg, initial encounter: Secondary | ICD-10-CM | POA: Diagnosis not present

## 2016-05-26 DIAGNOSIS — S80811A Abrasion, right lower leg, initial encounter: Secondary | ICD-10-CM | POA: Diagnosis not present

## 2016-05-26 DIAGNOSIS — I10 Essential (primary) hypertension: Secondary | ICD-10-CM | POA: Diagnosis not present

## 2016-05-28 ENCOUNTER — Encounter: Payer: Medicare Other | Admitting: Physical Therapy

## 2016-06-02 ENCOUNTER — Ambulatory Visit: Payer: Medicare Other

## 2016-06-02 ENCOUNTER — Ambulatory Visit
Admission: EM | Admit: 2016-06-02 | Discharge: 2016-06-02 | Disposition: A | Discharge status: Home / Self Care | Payer: Medicare Other | Attending: Family Medicine | Admitting: Family Medicine

## 2016-06-02 DIAGNOSIS — I10 Essential (primary) hypertension: Secondary | ICD-10-CM | POA: Insufficient documentation

## 2016-06-02 DIAGNOSIS — E039 Hypothyroidism, unspecified: Secondary | ICD-10-CM | POA: Insufficient documentation

## 2016-06-02 DIAGNOSIS — J449 Chronic obstructive pulmonary disease, unspecified: Secondary | ICD-10-CM | POA: Diagnosis not present

## 2016-06-02 DIAGNOSIS — E785 Hyperlipidemia, unspecified: Secondary | ICD-10-CM | POA: Insufficient documentation

## 2016-06-02 DIAGNOSIS — S29012A Strain of muscle and tendon of back wall of thorax, initial encounter: Secondary | ICD-10-CM

## 2016-06-02 DIAGNOSIS — Z79899 Other long term (current) drug therapy: Secondary | ICD-10-CM | POA: Insufficient documentation

## 2016-06-02 DIAGNOSIS — H3530 Unspecified macular degeneration: Secondary | ICD-10-CM | POA: Diagnosis not present

## 2016-06-02 DIAGNOSIS — Z7901 Long term (current) use of anticoagulants: Secondary | ICD-10-CM | POA: Insufficient documentation

## 2016-06-02 DIAGNOSIS — N5201 Erectile dysfunction due to arterial insufficiency: Secondary | ICD-10-CM | POA: Diagnosis not present

## 2016-06-02 DIAGNOSIS — X58XXXA Exposure to other specified factors, initial encounter: Secondary | ICD-10-CM | POA: Insufficient documentation

## 2016-06-02 DIAGNOSIS — R5383 Other fatigue: Secondary | ICD-10-CM | POA: Diagnosis not present

## 2016-06-02 DIAGNOSIS — M542 Cervicalgia: Secondary | ICD-10-CM | POA: Diagnosis present

## 2016-06-02 DIAGNOSIS — Z87891 Personal history of nicotine dependence: Secondary | ICD-10-CM | POA: Insufficient documentation

## 2016-06-02 DIAGNOSIS — I2510 Atherosclerotic heart disease of native coronary artery without angina pectoris: Secondary | ICD-10-CM | POA: Insufficient documentation

## 2016-06-02 DIAGNOSIS — I480 Paroxysmal atrial fibrillation: Secondary | ICD-10-CM | POA: Insufficient documentation

## 2016-06-02 DIAGNOSIS — G4733 Obstructive sleep apnea (adult) (pediatric): Secondary | ICD-10-CM | POA: Diagnosis not present

## 2016-06-02 DIAGNOSIS — R4701 Aphasia: Secondary | ICD-10-CM | POA: Diagnosis not present

## 2016-06-02 NOTE — ED Provider Notes (Signed)
[facility]    CSN: [id number] Arrival date & time: 06/02/16  1517     History   Chief Complaint Chief Complaint  Patient presents with  . Aphasia    HPI [name] is a 82 y.o. male.   82 yo male with a c/o left shoulder pain and neck pain intermittently for the past couple of months. Wife also states that for the past couple of months he's had some slurred speech which they think is attributed to dry mouth. Denies any numbness/tingling, vision changes, one-sided weakness, trouble swallowing.    The history is provided by the patient.    Past Medical History:  Diagnosis Date  . Basal cell carcinoma of eyelid 01/03/2013  . Cancer [facility])    Skin cancer  . Colon polyps   . Hemorrhoid 02/10/2015  . Memory loss   . Osteoporosis   . Sciatic pain    Chronic  . Temporary cerebral vascular dysfunction 02/10/2015   Had negative work-up.  July, 2012.  No medication changes, had forgot them that day, got dehydrated.     Patient Active Problem List   Diagnosis Date Noted  . Hypothyroidism 04/25/2016  . Macular degeneration 04/25/2016  . Erectile dysfunction due to arterial insufficiency   . Ischemic heart disease with chronotropic incompetence   . CN (constipation) 02/10/2015  . DD (diverticular disease) 02/10/2015  . Accumulation of fluid in tissues 02/10/2015  . Elevated WBC count 02/10/2015  . Fatigue 02/10/2015  . Hypoxia 02/10/2015  . Lichen planus 40/98/1191  . Restless leg 02/10/2015  . 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 B12 deficiency 02/10/2015  . SOB (shortness of breath) 12/14/2014  . Lower extremity edema 12/14/2014  . COPD (chronic obstructive pulmonary disease) ([place]) 11/14/2014  . Post Inflammatory Lung Changes 11/14/2014  . Confusion state 11/08/2014  . Amnesia 11/08/2014  . PAF (paroxysmal atrial fibrillation) (HCC) CHA2DS2-VASc = 4. AC = Eliquis   . Insomnia 12/09/2013  . OSA on CPAP   . Erectile dysfunction   . Dyspnea on  exertion - -essentially resolved with BB dose reduction & wgt loss 03/29/2013  . Overweight (BMI 25.0-29.9) -- Wgt back up 01/17/2013  . Atherosclerotic heart disease of native coronary artery without angina pectoris - s/p CABG, occluded SVG-D1; patent LIMA-LAD, SVG-OM, SVG- RCA   . Essential hypertension   . Hyperlipidemia with target LDL less than 70   . Aorto-iliac disease ([place])   . Allergic rhinitis 08/17/2009  . Arthralgia of ankle or foot 06/04/2009  . Adaptation reaction 05/28/2009  . Benign prostatic hypertrophy without urinary obstruction 05/28/2009  . Acid reflux 05/28/2009  . Arthritis, degenerative 05/28/2009  . 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 Carotid artery occlusion without infarction 06/01/2006  . PAD (peripheral artery disease) - bilateral common iliac stents 12/15/2005  . Aortoiliac occlusive disease ([place]) 11/30/2005  . Atherosclerotic heart disease of artery bypass graft 09/01/1997    Past Surgical History:  Procedure Laterality Date  . ANKLE SURGERY    . Arch aortogram and carotid aortogram  06/02/2006   Dr Oneida Alar did surgery  . BASAL CELL CARCINOMA EXCISION  04/2016   Dermatology  . CARDIAC CATHETERIZATION  01/30/1998     total native LAD andRCA ,mod native CIRC; svg to RCA and OBTUSE MARG. PATENT the left internal mammary artery graft to LAD ; LV NORMAL  /  . CAROTID ENDARTERECTOMY Left Oct. 29, 2007   Dr. Oneida Alar:  . [place]  LIMA-LAD, SVG-OM, SVG-RPDA, SVG-DIAG  . CPET / MET  12/2012   Mild chronotropic incompetence - read 82% of predicted; also reduced effort; peak VO2 15.7 / 75% (did not reach Max effort) -- suggested ischemic response in last 1.5 minutes of exercise. Normal pulmonary function on PFTs but poor response to  . DOPPLER ECHOCARDIOGRAPHY  11/20/2010   EF =>55%; LV norm mild aortic scelorosis  . FRACTURE SURGERY Right 03/19/2015   wrist  . HIATAL HERNIA REPAIR    . ILIAC ARTERY STENT  12/15/2005   PTA  and direct stenting rgt and lft common iliac arteries  . NM MYOCAR PERF WALL MOTION  11/11/2010; February 2016   a) TM STRESS----NORMAL PERFUSION ,EF 68%; b) EF 50-55%. Normal LV function. No significant ischemia or infarction.  . ORIF WRIST FRACTURE Right 03/19/2015   Procedure: OPEN REDUCTION INTERNAL FIXATION (ORIF) WRIST FRACTURE;  Surgeon: Hessie Knows, MD;  Location: [facility] ORS;  Service: Orthopedics;  Laterality: Right;  . THORACIC AORTA - CAROTID ANGIOGRAM  October 2007   Dr. 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 TRANSTHORACIC ECHOCARDIOGRAM  February 2016   [facility]: Normal LV function. Dilated left atrium.       Home Medications    Prior to Admission medications   Medication Sig Start Date End Date Taking? Authorizing Provider  apixaban (ELIQUIS) 5 MG TABS tablet Take 2.5 mg by mouth every evening.   Yes Historical Provider, MD  apixaban (ELIQUIS) 2.5 MG TABS tablet Take 5 mg by mouth every morning.     Historical Provider, MD  Ascorbic Acid (VITAMIN C) 1000 MG tablet Take 1,000 mg by mouth daily.    Historical Provider, MD  Biotin 1000 MCG tablet Take 1,000 mcg by mouth daily.    Historical Provider, MD  DEXILANT 60 MG capsule TAKE 1 CAPSULE EVERY DAY 02/04/16   Margarita Rana, MD  lisinopril (PRINIVIL,ZESTRIL) 2.5 MG tablet TAKE ONE TABLET TWICE DAILY 02/21/16   Margarita Rana, MD  magnesium oxide (MAG-OX) 400 MG tablet Take 400 mg by mouth daily.    Historical Provider, MD  metoprolol tartrate (LOPRESSOR) 25 MG tablet Take 1 tablet (25 mg total) by mouth 2 (two) times daily. 03/19/16   Leonie Man, MD  mirabegron ER (MYRBETRIQ) 25 MG TB24 tablet Take 25 mg by mouth daily.    Historical Provider, MD  Multiple Vitamins-Minerals (PRESERVISION AREDS) TABS Take 1 tablet by mouth 2 (two) times daily.    Historical Provider, MD  nitroGLYCERIN (NITROSTAT) 0.4 MG SL tablet Place 1 tablet (0.4 mg total)  under the tongue every 5 (five) minutes as needed for chest pain. 09/13/14   Leonie Man, MD  rosuvastatin (CRESTOR) 20 MG tablet TAKE ONE TABLET EVERY DAY 12/07/15   Margarita Rana, MD  ZETIA 10 MG tablet TAKE ONE TABLET EVERY DAY 03/23/16   [name], MD    Family History Family History  Problem Relation Age of Onset  . Ulcers Mother     Peptic  . Dementia Mother   . Alcohol abuse Father     Social History Social History  Substance Use Topics  . Smoking status: Former Smoker    Types: Cigarettes    Quit date: 08/04/1997  . Smokeless tobacco: Never Used  . Alcohol use No     Allergies   Clonazepam; Codeine; and Niacin and related   Review of Systems Review of Systems   Physical Exam Triage  Vital Signs ED Triage Vitals  Enc Vitals Group     BP 06/02/16 1529 (!) 119/106     Pulse Rate 06/02/16 1529 79     Resp 06/02/16 1529 18     Temp --      Temp src --      SpO2 06/02/16 1529 97 %     Weight 06/02/16 1529 222 lb (100.7 kg)     Height 06/02/16 1529 6' (1.829 m)     Head Circumference --      Peak Flow --      Pain Score 06/02/16 1530 10     Pain Loc --      Pain Edu? --      Excl. in [place]? --    No data found.   Updated Vital Signs BP (!) 119/106 (BP Location: Right Arm)   Pulse 79   Resp 18   Ht 6' (1.829 m)   Wt 222 lb (100.7 kg)   SpO2 97%   BMI 30.11 kg/m   Visual Acuity Right Eye Distance:   Left Eye Distance:   Bilateral Distance:    Right Eye Near:   Left Eye Near:    Bilateral Near:     Physical Exam  Constitutional: He is oriented to person, place, and time. He appears well-developed and well-nourished. No distress.  HENT:  Head: Normocephalic and atraumatic.  Right Ear: Tympanic membrane, external ear and ear canal normal.  Left Ear: Tympanic membrane, external ear and ear canal normal.  Nose: Nose normal.  Mouth/Throat: Uvula is midline, oropharynx is clear and moist and mucous membranes are normal. No oropharyngeal  exudate or tonsillar abscesses.  Eyes: Conjunctivae and EOM are normal. Pupils are equal, round, and reactive to light. Right eye exhibits no discharge. Left eye exhibits no discharge. No scleral icterus.  Neck: Normal range of motion. Neck supple. No tracheal deviation present. No thyromegaly present.  Cardiovascular: Normal rate, regular rhythm and normal heart sounds.   Pulmonary/Chest: Effort normal and breath sounds normal. No stridor. No respiratory distress. He has no wheezes. He has no rales. He exhibits no tenderness.  Musculoskeletal: He exhibits tenderness.       Cervical back: He exhibits tenderness (over the trapezius muscle on the left) and spasm. He exhibits normal range of motion, no bony tenderness, no swelling, no edema, no deformity, no laceration, no pain and normal pulse.       Back:  Lymphadenopathy:    He has no cervical adenopathy.  Neurological: He is alert and oriented to person, place, and time. He has normal reflexes. He displays normal reflexes. No cranial nerve deficit. He exhibits normal muscle tone. Coordination normal.  Skin: Skin is warm and dry. No rash noted. He is not diaphoretic.  Psychiatric: He has a normal mood and affect. His behavior is normal. Thought content normal.  Nursing note and vitals reviewed.    UC Treatments / Results  Labs (all labs ordered are listed, but only abnormal results are displayed) Labs Reviewed - No data to display  EKG  EKG Interpretation None       Radiology Dg Chest 2 View  Result Date: 06/02/2016 CLINICAL DATA:  Fatigue and weakness. EXAM: CHEST  2 VIEW COMPARISON:  CT of the chest on 12/05/2015 FINDINGS: The heart size and mediastinal contours are within normal limits and stable post CABG. Stable calcified granuloma at the right lung apex. Stable bilateral parenchymal scarring. There is no evidence of pulmonary edema, consolidation, pneumothorax,  nodule or pleural fluid. The visualized skeletal structures are  unremarkable. IMPRESSION: No active cardiopulmonary disease. Electronically Signed   By: Aletta Edouard M.D.   On: 06/02/2016 16:19    Procedures Procedures (including critical care time)  Medications Ordered in UC Medications - No data to display   Initial Impression / Assessment and Plan / UC Course  I have reviewed the triage vital signs and the nursing notes.  Pertinent labs & imaging results that were available during my care of the patient were reviewed by me and considered in my medical decision making (see chart for details).  Clinical Course      Final Clinical Impressions(s) / UC Diagnoses   Final diagnoses:  Upper back strain, initial encounter    New Prescriptions Discharge Medication List as of 06/02/2016  4:37 PM     1. Labs/x-ray results and diagnosis reviewed with patient and wife 2. rx as per orders above; reviewed possible side effects, interactions, risks and benefits  3. Recommend supportive treatment with rest, heat/ice, stretching 4. Follow-up prn if symptoms worsen or don't improve   Norval Gable, MD 06/04/16 1552

## 2016-06-02 NOTE — ED Triage Notes (Signed)
Pt c/o of slurred speech that the wife says he has had for the last couple months first thing in the morning but as the day went on it went away. However it has remained constant the last 2 days. She says he may have had a stroke a couple years ago. And this morning he woke up with terrible pain in his left shoulder and neck. He doesn't walk with a cane however he is using a cane to help him walk today.

## 2016-06-03 ENCOUNTER — Telehealth: Payer: Self-pay | Admitting: Internal Medicine

## 2016-06-03 ENCOUNTER — Ambulatory Visit: Payer: Medicare Other | Admitting: Physical Therapy

## 2016-06-03 DIAGNOSIS — G4733 Obstructive sleep apnea (adult) (pediatric): Secondary | ICD-10-CM

## 2016-06-03 NOTE — Telephone Encounter (Signed)
Try to call pt but no answer. LMOVM to return call.

## 2016-06-03 NOTE — Telephone Encounter (Signed)
Pt caretaker calling stating that pt CPAP machine is giving him many issues.  His oxygen level is running in the low 90's, it is time to renew it but would like to know what can be done in the meantime Please call back

## 2016-06-04 ENCOUNTER — Telehealth: Payer: Self-pay

## 2016-06-04 NOTE — Telephone Encounter (Signed)
Pt taking off CPAP nasal pillow in sleep. Is it an option to send pt for mask fitting at [facility]? Pt is scheduled for a f/u appt with you 10/23. He is due for new supplies and possibly a new machine. [facility] states last download for pt's BiPAP is 01/2015. Ailene Ravel says she will try and get a download before his f/u appt with you. Pt's current settings are 5-20. Please advise.

## 2016-06-04 NOTE — Telephone Encounter (Signed)
Courtesy call back completed today for patient's recent visit at [facility]. Patient did not answer, left message on machine to call back with any questions or concerns.   

## 2016-06-04 NOTE — Telephone Encounter (Signed)
That's fine with me. Whoever he uses as his DME ([facility] or [facility]), he can get a mask fitting with me. When he follows up with me I can write for the new supplies at that time, if he wants.   Thanks

## 2016-06-05 ENCOUNTER — Ambulatory Visit: Payer: Medicare Other | Admitting: Physical Therapy

## 2016-06-05 ENCOUNTER — Encounter: Payer: Self-pay | Admitting: Family Medicine

## 2016-06-05 ENCOUNTER — Ambulatory Visit (INDEPENDENT_AMBULATORY_CARE_PROVIDER_SITE_OTHER): Payer: Medicare Other | Admitting: Family Medicine

## 2016-06-05 VITALS — BP 124/72 | HR 82 | Temp 97.9°F | Resp 16 | Wt 222.2 lb

## 2016-06-05 DIAGNOSIS — I779 Disorder of arteries and arterioles, unspecified: Secondary | ICD-10-CM | POA: Diagnosis not present

## 2016-06-05 DIAGNOSIS — S161XXA Strain of muscle, fascia and tendon at neck level, initial encounter: Secondary | ICD-10-CM | POA: Diagnosis not present

## 2016-06-05 MED ORDER — DIAZEPAM 2 MG PO TABS: 2.0000 mg | ORAL_TABLET | Freq: Three times a day (TID) | ORAL | 0 refills | Status: DC | PRN

## 2016-06-05 NOTE — Progress Notes (Signed)
Subjective:     Patient ID: [name], male   DOB: [birth year], 82 y.o.   MRN: [id number]  HPI  Chief Complaint  Patient presents with  . Neck Pain    Patient comes in office today with complaints of neck pain that began on Monday 06/02/16. Wife who is accompanied with patient today states that patient recently changed pillows when pain in neck began. Patient was seen at [facility] but wife states that doctor believed that it was due to muscle in neck. Patient states pain is increasing and is described as a shooting pain that is radiating down into his collar bone.   [name] Aphasia    Wife has concerns about patients speech patterns for the past 4-5 months intermittent. Wife states that when she spoke with physcian in [place] he suggested that it might be due to patients medication. Patients wife reports that they d/c Myrbetriq because they believed that the drug was causing slurred speech.   States speech has improved. Previously felt to have a left Trapezius strain. Remains sore in his Left neck area and to a lesser extent on the right. States he recently got a new pillow. Accompanied by his wife today. Has been taking Advil and Tylenol for his sx.   Review of Systems     Objective:   Physical Exam  Constitutional: He appears well-developed and well-nourished. No distress.  Musculoskeletal:  Tender over his L SCM with increased pain on flexion and left lateral movement. Do not elicit any right sided tenderness.       Assessment:    1. Cervical strain, acute, initial encounter - diazepam (VALIUM) 2 MG tablet; Take 1 tablet (2 mg total) by mouth every 8 (eight) hours as needed for anxiety.  Dispense: 12 tablet; Refill: 0    Plan:    Discussed rational use of Tylenol and heat. Stop nsaid due to anticoagulant. Deferred c-spine x-ray if not improving.

## 2016-06-05 NOTE — Patient Instructions (Signed)
Discussed use of heat for 20 minutes several x day. May use Tylenol ES 500 mg. Two pills 3 x day for pain. Watch for sedation with diazepam.

## 2016-06-05 NOTE — Telephone Encounter (Signed)
Spoke with EC and informed her we will place order for mask fitting at [facility]. Will place order. Nothing further needed.

## 2016-06-06 DIAGNOSIS — H353112 Nonexudative age-related macular degeneration, right eye, intermediate dry stage: Secondary | ICD-10-CM | POA: Diagnosis not present

## 2016-06-09 ENCOUNTER — Ambulatory Visit (INDEPENDENT_AMBULATORY_CARE_PROVIDER_SITE_OTHER): Payer: Medicare Other | Admitting: Family Medicine

## 2016-06-09 ENCOUNTER — Ambulatory Visit
Admission: RE | Admit: 2016-06-09 | Discharge: 2016-06-09 | Disposition: A | Discharge status: Home / Self Care | Payer: Medicare Other | Source: Ambulatory Visit | Attending: Family Medicine | Admitting: Family Medicine

## 2016-06-09 ENCOUNTER — Ambulatory Visit: Payer: Medicare Other | Admitting: Physical Therapy

## 2016-06-09 ENCOUNTER — Other Ambulatory Visit: Payer: Self-pay | Admitting: Family Medicine

## 2016-06-09 ENCOUNTER — Encounter: Payer: Self-pay | Admitting: Family Medicine

## 2016-06-09 ENCOUNTER — Other Ambulatory Visit
Admission: RE | Admit: 2016-06-09 | Discharge: 2016-06-09 | Disposition: A | Discharge status: Home / Self Care | Payer: Medicare Other | Source: Ambulatory Visit | Attending: Family Medicine | Admitting: Family Medicine

## 2016-06-09 VITALS — BP 90/70 | HR 86 | Temp 97.9°F | Resp 16 | Wt 220.0 lb

## 2016-06-09 DIAGNOSIS — M542 Cervicalgia: Secondary | ICD-10-CM

## 2016-06-09 DIAGNOSIS — M50322 Other cervical disc degeneration at C5-C6 level: Secondary | ICD-10-CM | POA: Insufficient documentation

## 2016-06-09 DIAGNOSIS — R5383 Other fatigue: Secondary | ICD-10-CM | POA: Diagnosis not present

## 2016-06-09 DIAGNOSIS — I779 Disorder of arteries and arterioles, unspecified: Secondary | ICD-10-CM | POA: Diagnosis not present

## 2016-06-09 DIAGNOSIS — R0609 Other forms of dyspnea: Secondary | ICD-10-CM | POA: Diagnosis not present

## 2016-06-09 DIAGNOSIS — R002 Palpitations: Secondary | ICD-10-CM | POA: Insufficient documentation

## 2016-06-09 DIAGNOSIS — R000 Tachycardia, unspecified: Secondary | ICD-10-CM | POA: Diagnosis not present

## 2016-06-09 DIAGNOSIS — R0600 Dyspnea, unspecified: Secondary | ICD-10-CM

## 2016-06-09 LAB — COMPREHENSIVE METABOLIC PANEL
ALT: 20 U/L (ref 17–63)
AST: 26 U/L (ref 15–41)
Albumin: 3.9 g/dL (ref 3.5–5.0)
Alkaline Phosphatase: 81 U/L (ref 38–126)
Anion gap: 7 (ref 5–15)
BUN: 15 mg/dL (ref 6–20)
CO2: 26 mmol/L (ref 22–32)
Calcium: 9.3 mg/dL (ref 8.9–10.3)
Chloride: 107 mmol/L (ref 101–111)
Creatinine, Ser: 1.1 mg/dL (ref 0.61–1.24)
GFR calc Af Amer: >60 mL/min (ref >60)
GFR calc non Af Amer: >60 mL/min (ref >60)
Glucose, Bld: 112 mg/dL — ABNORMAL HIGH (ref 65–99)
Potassium: 3.7 mmol/L (ref 3.5–5.1)
Sodium: 140 mmol/L (ref 135–145)
Total Bilirubin: 1.2 mg/dL (ref 0.3–1.2)
Total Protein: 7.1 g/dL (ref 6.5–8.1)

## 2016-06-09 LAB — CBC
HCT: 44.5 % (ref 40.0–52.0)
Hemoglobin: 14.9 g/dL (ref 13.0–18.0)
MCH: 30.9 pg (ref 26.0–34.0)
MCHC: 33.5 g/dL (ref 32.0–36.0)
MCV: 92.3 fL (ref 80.0–100.0)
Platelets: 247 10*3/uL (ref 150–440)
RBC: 4.82 MIL/uL (ref 4.40–5.90)
RDW: 14.9 % — ABNORMAL HIGH (ref 11.5–14.5)
WBC: 9.8 10*3/uL (ref 3.8–10.6)

## 2016-06-09 LAB — TSH: TSH: 2.843 u[IU]/mL (ref 0.350–4.500)

## 2016-06-09 LAB — MAGNESIUM: Magnesium: 2 mg/dL (ref 1.7–2.4)

## 2016-06-09 LAB — BRAIN NATRIURETIC PEPTIDE: B Natriuretic Peptide: 86 pg/mL (ref 0.0–100.0)

## 2016-06-09 MED ORDER — PREDNISONE 20 MG PO TABS: 20.0000 mg | ORAL_TABLET | Freq: Every day | ORAL | 0 refills | Status: AC

## 2016-06-09 NOTE — Progress Notes (Signed)
Patient: [name] Male    DOB: 1934-05-20   82 y.o.   MRN: [id number] Visit Date: 06/09/2016  Today's Provider: Lelon Huh, MD   Chief Complaint  Patient presents with  . Neck Pain   Subjective:    Neck Pain   This is a recurrent problem. The current episode started 1 to 4 weeks ago. The problem occurs constantly. The problem has been gradually worsening. Quality: sharp. Associated symptoms include weakness. Pertinent negatives include no chest pain, fever, headaches or leg pain.  Patient was last seen by Carmon Ginsberg PA-C on 06/05/2016 for cervical strain, and was prescribed Valium. Patient states he has been using the Valium at night which has helped with night time pain. The neck pain has worsened since the last visit, and now radiates underneath his ears.  Also reports periods of feeling short of breath, worse with exertion and more frequent for the last few weeks. Has also been seeing chiropractor with very little improvement.      Allergies  Allergen Reactions  . Clonazepam Other (See Comments)    Altered mental status  . Codeine   . Niacin And Related      Current Outpatient Prescriptions:  .  apixaban (ELIQUIS) 5 MG TABS tablet, Take 2.5 mg by mouth every evening., Disp: , Rfl:  .  Ascorbic Acid (VITAMIN C) 1000 MG tablet, Take 1,000 mg by mouth daily., Disp: , Rfl:  .  Biotin 1000 MCG tablet, Take 1,000 mcg by mouth daily., Disp: , Rfl:  .  DEXILANT 60 MG capsule, TAKE 1 CAPSULE EVERY DAY, Disp: 30 capsule, Rfl: 5 .  diazepam (VALIUM) 2 MG tablet, Take 1 tablet (2 mg total) by mouth every 8 (eight) hours as needed for anxiety., Disp: 12 tablet, Rfl: 0 .  lisinopril (PRINIVIL,ZESTRIL) 2.5 MG tablet, TAKE ONE TABLET TWICE DAILY, Disp: 180 tablet, Rfl: 1 .  magnesium oxide (MAG-OX) 400 MG tablet, Take 400 mg by mouth daily., Disp: , Rfl:  .  metoprolol tartrate (LOPRESSOR) 25 MG tablet, Take 1 tablet (25 mg total) by mouth 2 (two) times daily., Disp:  180 tablet, Rfl: 2 .  mirabegron ER (MYRBETRIQ) 25 MG TB24 tablet, Take 25 mg by mouth daily., Disp: , Rfl:  .  Multiple Vitamins-Minerals (PRESERVISION AREDS) TABS, Take 1 tablet by mouth 2 (two) times daily., Disp: , Rfl:  .  nitroGLYCERIN (NITROSTAT) 0.4 MG SL tablet, Place 1 tablet (0.4 mg total) under the tongue every 5 (five) minutes as needed for chest pain., Disp: 25 tablet, Rfl: 3 .  rosuvastatin (CRESTOR) 20 MG tablet, TAKE ONE TABLET EVERY DAY, Disp: 90 tablet, Rfl: 1 .  ZETIA 10 MG tablet, TAKE ONE TABLET EVERY DAY, Disp: 90 tablet, Rfl: 3  Review of Systems  Constitutional: Positive for appetite change (no appetite). Negative for chills and fever.  Respiratory: Positive for shortness of breath. Negative for chest tightness and wheezing.   Cardiovascular: Negative for chest pain and palpitations.  Gastrointestinal: Positive for abdominal pain. Negative for nausea and vomiting.  Musculoskeletal: Positive for neck pain.  Neurological: Positive for dizziness, weakness and light-headedness. Negative for headaches.  Psychiatric/Behavioral: Positive for confusion.    Social History  Substance Use Topics  . Smoking status: Former Smoker    Types: Cigarettes    Quit date: 08/04/1997  . Smokeless tobacco: Never Used  . Alcohol use No   Objective:   BP 90/70 (BP Location: Right Arm, Patient Position: Sitting, Cuff Size:  Large)   Pulse 86   Temp 97.9 F (36.6 C) (Oral)   Resp 16   Wt 220 lb (99.8 kg)   SpO2 97% Comment: room air  BMI 29.84 kg/m   Physical Exam   General Appearance:    Alert, cooperative, no distress  Eyes:    PERRL, conjunctiva/corneas clear, EOM's intact       Lungs:     Clear to auscultation bilaterally, respirations unlabored  Heart:    Variable rate, changes from mid 70s to [id number], III/VI systolic murmur  Neurologic:   Awake, alert, oriented x 3. No apparent focal neurological           defect.   MS:   Tender para-cervical muscles reproduced at limits of  head rotation, flexion, and extension   EKG- Sinus tachycardia  Dg Chest 2 View  Result Date: 06/02/2016 CLINICAL DATA:  Fatigue and weakness. EXAM: CHEST  2 VIEW COMPARISON:  CT of the chest on 12/05/2015 FINDINGS: The heart size and mediastinal contours are within normal limits and stable post CABG. Stable calcified granuloma at the right lung apex. Stable bilateral parenchymal scarring. There is no evidence of pulmonary edema, consolidation, pneumothorax, nodule or pleural fluid. The visualized skeletal structures are unremarkable. IMPRESSION: No active cardiopulmonary disease. Electronically Signed   By: Aletta Edouard M.D.   On: 06/02/2016 16:19   Dg Cervical Spine Complete  Result Date: 06/09/2016 CLINICAL DATA:  Neck pain for 1 week, no injury, no numbness tingling in hands or fingers. EXAM: CERVICAL SPINE - COMPLETE 4+ VIEW COMPARISON:  None. FINDINGS: The cervical spine is visualized from the occiput to T1. Alignment is anatomic. Vertebral body height is maintained. Prevertebral soft tissues are within normal limits. Multilevel endplate degenerative changes, loss of disc space height and facet hypertrophy. Findings are worst at C5-6 and C6-7. Scattered bilateral neural foraminal narrowing. Dens is partially obscured on the dedicated view. Calcified granuloma in the apical right upper lobe. IMPRESSION: Multilevel degenerative disc disease, worst at C5-6 and C6-7. Electronically Signed   By: Lorin Picket M.D.   On: 06/09/2016 12:30    BNP, TSH, CBC, Mg, CMP WNL.     Assessment & Plan:     1. Other fatigue  - Comprehensive metabolic panel - CBC - TSH  2. Dyspnea on exertion  - Brain natriuretic peptide  3. Neck pain Likely cervical strain. Not an NSAID candidate.  - DG Cervical Spine Complete; Future - Magnesium - predniSONE (DELTASONE) 20 MG tablet; Take 1 tablet (20 mg total) by mouth daily with breakfast.  Dispense: 10 tablet; Refill: 0  4. Tachycardia Possible  secondary to pain. BP is to low to increase beta-blocker. Consider referral back to cardiology if not feeling better once pain is under control - EKG 12-Lead     The entirety of the information documented in the History of Present Illness, Review of Systems and Physical Exam were personally obtained by me. Portions of this information were initially documented by Meyer Cory, CMA and reviewed by me for thoroughness and accuracy.   Lelon Huh, MD  [facility]

## 2016-06-10 ENCOUNTER — Ambulatory Visit (HOSPITAL_BASED_OUTPATIENT_CLINIC_OR_DEPARTMENT_OTHER): Payer: Medicare Other | Attending: Internal Medicine | Admitting: Radiology

## 2016-06-10 DIAGNOSIS — G4733 Obstructive sleep apnea (adult) (pediatric): Secondary | ICD-10-CM

## 2016-06-11 ENCOUNTER — Ambulatory Visit: Payer: Medicare Other | Attending: Orthopedic Surgery | Admitting: Physical Therapy

## 2016-06-11 DIAGNOSIS — R262 Difficulty in walking, not elsewhere classified: Secondary | ICD-10-CM | POA: Insufficient documentation

## 2016-06-11 DIAGNOSIS — M6281 Muscle weakness (generalized): Secondary | ICD-10-CM | POA: Insufficient documentation

## 2016-06-13 ENCOUNTER — Other Ambulatory Visit: Payer: Self-pay | Admitting: Family Medicine

## 2016-06-13 DIAGNOSIS — E785 Hyperlipidemia, unspecified: Secondary | ICD-10-CM

## 2016-06-16 ENCOUNTER — Encounter: Payer: Self-pay | Admitting: Physical Therapy

## 2016-06-16 ENCOUNTER — Ambulatory Visit: Payer: Medicare Other | Admitting: Physical Therapy

## 2016-06-16 DIAGNOSIS — M6281 Muscle weakness (generalized): Secondary | ICD-10-CM

## 2016-06-16 DIAGNOSIS — N401 Enlarged prostate with lower urinary tract symptoms: Secondary | ICD-10-CM | POA: Diagnosis not present

## 2016-06-16 DIAGNOSIS — R972 Elevated prostate specific antigen [PSA]: Secondary | ICD-10-CM | POA: Diagnosis not present

## 2016-06-16 DIAGNOSIS — R262 Difficulty in walking, not elsewhere classified: Secondary | ICD-10-CM | POA: Diagnosis not present

## 2016-06-16 NOTE — Therapy (Signed)
[place] MAIN [facility] SERVICES [address] [place], [place], [address] Phone: [phone]   Fax:  [phone]  Physical Therapy Treatment  Patient Details  Name: [name] MRN: [id number] Date of Birth: [birth year] Referring Provider: Duanne Guess  Encounter Date: 06/16/2016      PT End of Session - 06/16/16 0842    Visit Number 16   Number of Visits 117   Date for PT Re-Evaluation 05/15/16   Authorization Type 6/10   PT Start Time 0830   PT Stop Time 0910   PT Time Calculation (min) 40 min   Equipment Utilized During Treatment Gait belt   Activity Tolerance Patient tolerated treatment well      Past Medical History:  Diagnosis Date  . Basal cell carcinoma of eyelid 01/03/2013  . Cancer [facility])    Skin cancer  . Colon polyps   . Hemorrhoid 02/10/2015  . Memory loss   . Osteoporosis   . Sciatic pain    Chronic  . Temporary cerebral vascular dysfunction 02/10/2015   Had negative work-up.  July, 2012.  No medication changes, had forgot them that day, got dehydrated.     Past Surgical History:  Procedure Laterality Date  . ANKLE SURGERY    . Arch aortogram and carotid aortogram  06/02/2006   Dr Oneida Alar did surgery  . BASAL CELL CARCINOMA EXCISION  04/2016   Dermatology  . CARDIAC CATHETERIZATION  01/30/1998     total native LAD andRCA ,mod native CIRC; svg to RCA and OBTUSE MARG. PATENT the left internal mammary artery graft to LAD ; LV NORMAL  /  . CAROTID ENDARTERECTOMY Left Oct. 29, 2007   Dr. Oneida Alar:  . CORONARY ARTERY BYPASS GRAFT  1998   LIMA-LAD, SVG-OM, SVG-RPDA, SVG-DIAG  . CPET / MET  12/2012   Mild chronotropic incompetence - read 82% of predicted; also reduced effort; peak VO2 15.7 / 75% (did not reach Max effort) -- suggested ischemic response in last 1.5 minutes of exercise. Normal pulmonary function on PFTs but poor response to  . DOPPLER ECHOCARDIOGRAPHY  11/20/2010   EF =>55%; LV norm mild aortic  scelorosis  . FRACTURE SURGERY Right 03/19/2015   wrist  . HIATAL HERNIA REPAIR    . ILIAC ARTERY STENT  12/15/2005   PTA and direct stenting rgt and lft common iliac arteries  . NM MYOCAR PERF WALL MOTION  11/11/2010; February 2016   a) TM STRESS----NORMAL PERFUSION ,EF 68%; b) EF 50-55%. Normal LV function. No significant ischemia or infarction.  . ORIF WRIST FRACTURE Right 03/19/2015   Procedure: OPEN REDUCTION INTERNAL FIXATION (ORIF) WRIST FRACTURE;  Surgeon: Hessie Knows, MD;  Location: [facility] ORS;  Service: Orthopedics;  Laterality: Right;  . THORACIC AORTA - CAROTID ANGIOGRAM  October 2007   Dr. 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 TRANSTHORACIC ECHOCARDIOGRAM  February 2016   [facility]: Normal LV function. Dilated left atrium.    There were no vitals filed for this visit.      Subjective Assessment - 06/16/16 0840    Subjective Patient has been sick for 3 weeks and fell today and stubbed his right middle toe today.    Currently in Pain? Yes   Pain Score 8    Pain Location --  toe   Pain Orientation Right          OUTCOME MEASURES: TEST  Outcome  Interpretation  5 times sit<>stand  14.93sec  >60 yo, >15 sec indicates increased risk for falls   10 meter walk test    1.16          m/s  <1.0 m/s indicates increased risk for falls; limited community ambulator   Timed up and Go      10.62          sec  <14 sec indicates increased risk for falls   6 minute walk test  900          Feet  1000 feet is community ambulator      THER-EX Nustep L3 x 5 minutes for warm-up during history (5 minutes unbilled); Quantum double leg press 105#  x 10;x 3  CGA and Min to mod verbal cues used throughout with increased in postural sway and LOB most seen  while on uneven surfaces. Continues to have balance deficits typical with diagnosis. Patient performs intermediate level exercises without pain behaviors  and needs verbal cuing for postural alignment and head positioning T                    PT Education - 06/16/16 0842    Education provided Yes   Education Details slowing down and holding onto railings with steps   Person(s) Educated Patient   Methods Explanation   Comprehension Verbalized understanding             PT Long Term Goals - 06/16/16 0902      PT LONG TERM GOAL #1   Title Patient will be independent in home exercise program to improve strength/mobility for better functional independence with ADLs.   Time 4   Period Weeks   Status Achieved     PT LONG TERM GOAL #2   Title Patient (> 24 years old) will complete five times sit to stand test in < 15 seconds indicating an increased LE strength and improved balance.   Time 4   Period Weeks   Status On-going     PT LONG TERM GOAL #3   Title Patient will increase Berg Balance score by > 6 points to demonstrate decreased fall risk during functional activities.   Baseline 46/56   Time 4   Period Weeks   Status On-going     PT LONG TERM GOAL #4   Title Patient will increase 10 meter walk test to >1.57ms as to improve gait speed for better community ambulation and to reduce fall risk.   Time 4   Period Weeks   Status On-going     PT LONG TERM GOAL #5   Title Patient will be independent with ascend/descend 12 steps using single UE in step over step pattern without LOB.   Baseline uses 2 hands on one railing at home   Period Weeks   Status Partially Met               Plan - 06/16/16 0843    Clinical Impression Statement patient has been sick for 3 weeks and was in bed a lot and unable to be active due to sickness. His outcome measures were performed  and he was able to perform strengthening today due to right toe pain 8/10 from a fall today.    Rehab Potential Good   Clinical Impairments Affecting Rehab Potential recent shoulder fracture and wrist,    PT Frequency 2x / week   PT Duration 8  weeks   PT Treatment/Interventions Balance training;Therapeutic exercise;Therapeutic activities;Functional mobility training;Gait  training;Neuromuscular re-education   PT Next Visit Plan balance and strength training   PT Home Exercise Plan corner in modified tandem standing    Consulted and Agree with Plan of Care Patient      Patient will benefit from skilled therapeutic intervention in order to improve the following deficits and impairments:  Abnormal gait, Pain, Decreased mobility, Decreased strength, Decreased activity tolerance, Decreased balance, Difficulty walking, Impaired flexibility  Visit Diagnosis: Difficulty in walking, not elsewhere classified  Muscle weakness (generalized)       G-Codes - 2016-07-13 0903    Functional Assessment Tool Used Tug, 5 x sit to stand, 10 MW, 6 MW   Functional Limitation Mobility: Walking and moving around   Mobility: Walking and Moving Around Current Status [phone]) At least 20 percent but less than 40 percent impaired, limited or restricted   Mobility: Walking and Moving Around Goal Status [phone]) At least 20 percent but less than 40 percent impaired, limited or restricted      Problem List Patient Active Problem List   Diagnosis Date Noted  . Hypothyroidism 04/25/2016  . Macular degeneration 04/25/2016  . Erectile dysfunction due to arterial insufficiency   . Ischemic heart disease with chronotropic incompetence   . CN (constipation) 02/10/2015  . DD (diverticular disease) 02/10/2015  . Accumulation of fluid in tissues 02/10/2015  . Elevated WBC count 02/10/2015  . Fatigue 02/10/2015  . Hypoxia 02/10/2015  . Lichen planus 03/54/6568  . Restless leg 02/10/2015  . 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 B12 deficiency 02/10/2015  . SOB (shortness of breath) 12/14/2014  . Lower extremity edema 12/14/2014  . COPD (chronic obstructive pulmonary disease) ([place]) 11/14/2014  . Post Inflammatory Lung Changes 11/14/2014  . Confusion state  11/08/2014  . Amnesia 11/08/2014  . PAF (paroxysmal atrial fibrillation) (HCC) CHA2DS2-VASc = 4. AC = Eliquis   . Insomnia 12/09/2013  . OSA on CPAP   . Erectile dysfunction   . Dyspnea on exertion - -essentially resolved with BB dose reduction & wgt loss 03/29/2013  . Overweight (BMI 25.0-29.9) -- Wgt back up 01/17/2013  . Atherosclerotic heart disease of native coronary artery without angina pectoris - s/p CABG, occluded SVG-D1; patent LIMA-LAD, SVG-OM, SVG- RCA   . Essential hypertension   . Hyperlipidemia with target LDL less than 70   . Aorto-iliac disease ([place])   . Allergic rhinitis 08/17/2009  . Arthralgia of ankle or foot 06/04/2009  . Adaptation reaction 05/28/2009  . Benign prostatic hypertrophy without urinary obstruction 05/28/2009  . Acid reflux 05/28/2009  . Arthritis, degenerative 05/28/2009  . 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 Carotid artery occlusion without infarction 06/01/2006  . PAD (peripheral artery disease) - bilateral common iliac stents 12/15/2005  . Aortoiliac occlusive disease ([place]) 11/30/2005  . Atherosclerotic heart disease of artery bypass graft 09/01/1997    [name] 13-Jul-2016, 9:04 AM  [place] MAIN [facility] SERVICES [address] [place], [place], [address] Phone: [phone]   Fax:  [phone]  Name: [name] MRN: [id number] Date of Birth: 01-24-1934

## 2016-06-18 ENCOUNTER — Encounter: Payer: Self-pay | Admitting: Physical Therapy

## 2016-06-18 ENCOUNTER — Ambulatory Visit: Payer: Medicare Other | Admitting: Physical Therapy

## 2016-06-18 DIAGNOSIS — M6281 Muscle weakness (generalized): Secondary | ICD-10-CM

## 2016-06-18 DIAGNOSIS — N401 Enlarged prostate with lower urinary tract symptoms: Secondary | ICD-10-CM | POA: Diagnosis not present

## 2016-06-18 DIAGNOSIS — R3915 Urgency of urination: Secondary | ICD-10-CM | POA: Diagnosis not present

## 2016-06-18 DIAGNOSIS — R262 Difficulty in walking, not elsewhere classified: Secondary | ICD-10-CM | POA: Diagnosis not present

## 2016-06-18 DIAGNOSIS — R972 Elevated prostate specific antigen [PSA]: Secondary | ICD-10-CM | POA: Diagnosis not present

## 2016-06-18 DIAGNOSIS — R351 Nocturia: Secondary | ICD-10-CM | POA: Diagnosis not present

## 2016-06-18 NOTE — Therapy (Signed)
[place] MAIN [facility] SERVICES [address] [place], [place], [address] Phone: [phone]   Fax:  [phone]  Physical Therapy Treatment  Patient Details  Name: [name] MRN: [id number] Date of Birth: 1934/05/15 Referring Provider: Duanne Guess  Encounter Date: 06/18/2016      PT End of Session - 06/18/16 0854    Visit Number 17   Number of Visits 117   Date for PT Re-Evaluation 05/15/16   Authorization Type 7/10   PT Start Time 0830   PT Stop Time 0910   PT Time Calculation (min) 40 min   Equipment Utilized During Treatment Gait belt   Activity Tolerance Patient tolerated treatment well      Past Medical History:  Diagnosis Date  . Basal cell carcinoma of eyelid 01/03/2013  . Cancer [facility])    Skin cancer  . Colon polyps   . Hemorrhoid 02/10/2015  . Memory loss   . Osteoporosis   . Sciatic pain    Chronic  . Temporary cerebral vascular dysfunction 02/10/2015   Had negative work-up.  July, 2012.  No medication changes, had forgot them that day, got dehydrated.     Past Surgical History:  Procedure Laterality Date  . ANKLE SURGERY    . Arch aortogram and carotid aortogram  06/02/2006   Dr Oneida Alar did surgery  . BASAL CELL CARCINOMA EXCISION  04/2016   Dermatology  . CARDIAC CATHETERIZATION  01/30/1998     total native LAD andRCA ,mod native CIRC; svg to RCA and OBTUSE MARG. PATENT the left internal mammary artery graft to LAD ; LV NORMAL  /  . CAROTID ENDARTERECTOMY Left Oct. 29, 2007   Dr. Oneida Alar:  . CORONARY ARTERY BYPASS GRAFT  1998   LIMA-LAD, SVG-OM, SVG-RPDA, SVG-DIAG  . CPET / MET  12/2012   Mild chronotropic incompetence - read 82% of predicted; also reduced effort; peak VO2 15.7 / 75% (did not reach Max effort) -- suggested ischemic response in last 1.5 minutes of exercise. Normal pulmonary function on PFTs but poor response to  . DOPPLER ECHOCARDIOGRAPHY  11/20/2010   EF =>55%; LV norm mild aortic  scelorosis  . FRACTURE SURGERY Right 03/19/2015   wrist  . HIATAL HERNIA REPAIR    . ILIAC ARTERY STENT  12/15/2005   PTA and direct stenting rgt and lft common iliac arteries  . NM MYOCAR PERF WALL MOTION  11/11/2010; February 2016   a) TM STRESS----NORMAL PERFUSION ,EF 68%; b) EF 50-55%. Normal LV function. No significant ischemia or infarction.  . ORIF WRIST FRACTURE Right 03/19/2015   Procedure: OPEN REDUCTION INTERNAL FIXATION (ORIF) WRIST FRACTURE;  Surgeon: Hessie Knows, MD;  Location: [facility] ORS;  Service: Orthopedics;  Laterality: Right;  . THORACIC AORTA - CAROTID ANGIOGRAM  October 2007   Dr. 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 TRANSTHORACIC ECHOCARDIOGRAM  February 2016   [facility]: Normal LV function. Dilated left atrium.    There were no vitals filed for this visit.      Subjective Assessment - 06/18/16 0853    Subjective Patient continues to have right toe pain.     Currently in Pain? Yes   Pain Score 5    Pain Descriptors / Indicators Aching   Pain Type Acute pain   Pain Radiating Towards toe      NMR: BLE staggered stance on foam  x 8 min BLE tandem stance on  1/2 foam roll (upside down) with no UE support, 3x1 min each; min A throughout as pt demonstrated increased R lateral LOB bil BLE stance on 1/2 foam roll with no UE support and EC, 5x10 sec each; increased posterior LOB and decreased ankle strategies noted throughout BLE staggered stance anterior/posterior weight shifting on small rockerboard; CGA, increased difficulty and more unsteadiness with RLE fwd; min cues to increase weight shift over RLE BLE NBOS stance on airex pad with no UE and EC, 5x15 sec  1/2 foam tandem and horizontal  TM walking  CGA and Min to mod verbal cues used throughout with increased in postural sway and LOB most seen with narrow base of support and while on uneven surfaces. Continues to have balance  deficits typical with diagnosis. Patient performs intermediate level exercises without pain behaviors and needs verbal cuing for postural alignment and head positioning                         PT Education - 06/18/16 0854    Education provided Yes   Education Details HEP   Person(s) Educated Patient   Methods Explanation   Comprehension Verbalized understanding             PT Long Term Goals - 06/16/16 0902      PT LONG TERM GOAL #1   Title Patient will be independent in home exercise program to improve strength/mobility for better functional independence with ADLs.   Time 4   Period Weeks   Status Achieved     PT LONG TERM GOAL #2   Title Patient (> 54 years old) will complete five times sit to stand test in < 15 seconds indicating an increased LE strength and improved balance.   Time 4   Period Weeks   Status On-going     PT LONG TERM GOAL #3   Title Patient will increase Berg Balance score by > 6 points to demonstrate decreased fall risk during functional activities.   Baseline 46/56   Time 4   Period Weeks   Status On-going     PT LONG TERM GOAL #4   Title Patient will increase 10 meter walk test to >1.73ms as to improve gait speed for better community ambulation and to reduce fall risk.   Time 4   Period Weeks   Status On-going     PT LONG TERM GOAL #5   Title Patient will be independent with ascend/descend 12 steps using single UE in step over step pattern without LOB.   Baseline uses 2 hands on one railing at home   Period Weeks   Status Partially Met               Plan - 06/18/16 0855    Clinical Impression Statement Min cuing needed to maintain posture while performing balance    Rehab Potential Good   Clinical Impairments Affecting Rehab Potential recent shoulder fracture and wrist,    PT Frequency 2x / week   PT Duration 8 weeks   PT Treatment/Interventions Balance training;Therapeutic exercise;Therapeutic  activities;Functional mobility training;Gait training;Neuromuscular re-education   PT Next Visit Plan balance and strength training   PT Home Exercise Plan corner in modified tandem standing    Consulted and Agree with Plan of Care Patient      Patient will benefit from skilled therapeutic intervention in order to improve the following deficits and impairments:  Pain, Decreased mobility, Decreased strength, Decreased activity tolerance, Decreased balance,  Difficulty walking, Impaired flexibility, Abnormal gait  Visit Diagnosis: Difficulty in walking, not elsewhere classified  Muscle weakness (generalized)     Problem List Patient Active Problem List   Diagnosis Date Noted  . Hypothyroidism 04/25/2016  . Macular degeneration 04/25/2016  . Erectile dysfunction due to arterial insufficiency   . Ischemic heart disease with chronotropic incompetence   . CN (constipation) 02/10/2015  . DD (diverticular disease) 02/10/2015  . Accumulation of fluid in tissues 02/10/2015  . Elevated WBC count 02/10/2015  . Fatigue 02/10/2015  . Hypoxia 02/10/2015  . Lichen planus 80/16/5537  . Restless leg 02/10/2015  . 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 B12 deficiency 02/10/2015  . SOB (shortness of breath) 12/14/2014  . Lower extremity edema 12/14/2014  . COPD (chronic obstructive pulmonary disease) ([place]) 11/14/2014  . Post Inflammatory Lung Changes 11/14/2014  . Confusion state 11/08/2014  . Amnesia 11/08/2014  . PAF (paroxysmal atrial fibrillation) (HCC) CHA2DS2-VASc = 4. AC = Eliquis   . Insomnia 12/09/2013  . OSA on CPAP   . Erectile dysfunction   . Dyspnea on exertion - -essentially resolved with BB dose reduction & wgt loss 03/29/2013  . Overweight (BMI 25.0-29.9) -- Wgt back up 01/17/2013  . Atherosclerotic heart disease of native coronary artery without angina pectoris - s/p CABG, occluded SVG-D1; patent LIMA-LAD, SVG-OM, SVG- RCA   . Essential hypertension   . Hyperlipidemia  with target LDL less than 70   . Aorto-iliac disease ([place])   . Allergic rhinitis 08/17/2009  . Arthralgia of ankle or foot 06/04/2009  . Adaptation reaction 05/28/2009  . Benign prostatic hypertrophy without urinary obstruction 05/28/2009  . Acid reflux 05/28/2009  . Arthritis, degenerative 05/28/2009  . 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 Carotid artery occlusion without infarction 06/01/2006  . PAD (peripheral artery disease) - bilateral common iliac stents 12/15/2005  . Aortoiliac occlusive disease ([place]) 11/30/2005  . Atherosclerotic heart disease of artery bypass graft 09/01/1997    Alanson Puls 06/18/2016, 8:59 AM  [place] MAIN [facility] SERVICES [address] [place], [place], [address] Phone: [phone]   Fax:  [phone]  Name: [name] MRN: [id number] Date of Birth: 04-26-1934

## 2016-06-23 ENCOUNTER — Encounter: Payer: Medicare Other | Admitting: Physical Therapy

## 2016-06-23 ENCOUNTER — Encounter: Payer: Self-pay | Admitting: Internal Medicine

## 2016-06-23 ENCOUNTER — Ambulatory Visit (INDEPENDENT_AMBULATORY_CARE_PROVIDER_SITE_OTHER): Payer: Medicare Other | Admitting: Internal Medicine

## 2016-06-23 VITALS — BP 138/82 | HR 67 | Wt 221.0 lb

## 2016-06-23 DIAGNOSIS — Z9989 Dependence on other enabling machines and devices: Secondary | ICD-10-CM | POA: Diagnosis not present

## 2016-06-23 DIAGNOSIS — G4733 Obstructive sleep apnea (adult) (pediatric): Secondary | ICD-10-CM | POA: Diagnosis not present

## 2016-06-23 DIAGNOSIS — I779 Disorder of arteries and arterioles, unspecified: Secondary | ICD-10-CM

## 2016-06-23 NOTE — Patient Instructions (Addendum)
Follow up with Dr. Ashby Dawes in 6 weeks - please follow for mask fitting - we will cont with current CPAP settting - please let us know if you qualify for a new machine after your mask fitting - Follow up with sleep specialist in 6 wks - cont with Advair 250/50 - 1 puff BID

## 2016-06-23 NOTE — Progress Notes (Signed)
[facility] Consultation      MRN# [id number] [name] 07-29-1934   CC: Chief Complaint  Patient presents with  . Follow-up    OSA/breathing: no SOB; breathing doing well: had mask fitting with mask fitting; feels like he is doing ok on CPAP; stills has tired feeling       Brief History: HPI 11/14/14 Patient is a pleasant 82 year old male with a past medical history of hypertension, hyperlipidemia, obstructive sleep apnea on CPAP(not using), coronary artery disease who is seen today for hospital followup of acute respiratory failure. Detailed history as outlined hospital summarization below. Briefly, one week prior to admission patient started having gradual onset of shortness of breath, and confusion, he was seen at the emergency room initially for difficulty with urination, and was discharged from the emergency room at that time. The week progresses confusion shortness of breath continued and he represented to the emergency room chest x-ray showed probable pneumonia at that time. Since discharge patient states that he's been doing well his breathing is back to baseline, and he is back to attending the gym. Of note 1 month prior to admission he was in his usual state of health he attends a gym daily, he has a girlfriend that accompanies him. Further history reveals that he had upper respiratory tract infection urine December, was diagnosed with bronchitis, this cleared, one week prior to hospitalization he started having wheezing initially was thought to have a UTI, but as stated above this was negative, and confusion ensued along with hypoxia leading to admission to the hospital for approximately one week.  Today patient states that he is doing well he did not endorse any worsening shortness of breath, he actually states his shortness of breath is improving, he does have a history of obstructive sleep apnea and was probably on CPAP but has not been wearing CPAP. He  was diagnosed with OSA many years ago at sleep med. He denies any drug use, previously had dogs at home, previously employed in [employer] work, he does have a significant tobacco history previously smoked one to 2 packs per day for 32 years and quit a number of years ago.  Upon discharge from the hospital he was discharged on Advair, 3 L of oxygen continuously even at night and advised to followup to determine his outpatient oxygen needs. During his visit today he had a 6 minute walk test that showed lowest saturation on room air to 89%, he walked 936 feet, his highest heart rate was 108.  Patient also stated that he is currently on BiPAP, but his hospital discharge states CPAP. Plan: PFTs and HRCT, continue with inhalers   ROV 12/06/14: Patient presents today for a follow up visit.  Wearing cpap machine every night. No worsening shortness of breath. Had a repeat titration study done. Started back exercising.   Plan: CPAP 12 cm of water, 7 nights per week, minimum 6 hours per night. Previously on BiPAP, now switched over based on new split study back to CPAP. Cont with 2L at night with CPAP Cont with COPD regiment.   ROV 03/2015: Patient presents today for follow-up visit, he is accompanied by his girlfriend. Patient states overall he is doing well, exercising daily, eating healthy. He uses CPAP nightly about 5-6 hours per night. Patient stated that he recently went on vacation and for about 2-3 nights he did not have supplemental oxygen with CPAP, but checked his O2 saturations in the morning which are consistently in the  mid 76s. Patient had a repeat high-resolution CT scan of the chest performed about a week ago like to discuss the results of it.  Plan: -Continue Advair -Continue with tobacco cessation -Continue with diet and exercise -continue using NIVPPV (non-invasive positive pressure ventilation, CPAP) for OSA. -O2 at night, 2L , will repeat overnight pulse oximetry study,  based on results of studies will decide if to stop supplemental oxygen at night -Continue with COPD optimization, continue with OSA optimization. -High-resolution CT chest without contrast in one year to assess temporal changes  Events since last clinic visit: Patient presents today for a follow up visit of OSA/COPD. He states overall he is doing well from a respiratory standpoint, he has not had to use his albuterol rescue inhaler since his last visit. He did have an accidental fall where he fractured his right shoulder, and is currently in an immobilizer; this occurred 2 weeks ago. At his last visit he had his follow-up high-resolution CAT scan for suspected either the based on previous radiologic studies. Today he denies any weight loss, night sweats, significant shortness of breath, dyspnea on exertion. He is also accompanied by his friend. Patient states since his fall he did not wear his CPAP for about 10 days, but started wearing it about 3-4 days ago nightly. Patient states that his primary care physician was previously managing his OSA/CPAP, however she is moving in 2 months, and he requested that we take over his OSA/CPAP care, he is eligible for a new machine in October 2017, his DME is advanced. Today he does not have a printed compliance report, but he has written down his AHI and sleep times from his machine over the last 2 weeks, his AHI is across the board anywhere from 7-15.  Medication:    Current Outpatient Prescriptions:  .  apixaban (ELIQUIS) 5 MG TABS tablet, Take 5 mg by mouth 2 (two) times daily. , Disp: , Rfl:  .  Ascorbic Acid (VITAMIN C) 1000 MG tablet, Take 1,000 mg by mouth daily., Disp: , Rfl:  .  Biotin 1000 MCG tablet, Take 1,000 mcg by mouth daily., Disp: , Rfl:  .  DEXILANT 60 MG capsule, TAKE 1 CAPSULE EVERY DAY, Disp: 30 capsule, Rfl: 5 .  diazepam (VALIUM) 2 MG tablet, Take 1 tablet (2 mg total) by mouth every 8 (eight) hours as needed for anxiety., Disp: 12  tablet, Rfl: 0 .  lisinopril (PRINIVIL,ZESTRIL) 2.5 MG tablet, TAKE ONE TABLET TWICE DAILY, Disp: 180 tablet, Rfl: 1 .  magnesium oxide (MAG-OX) 400 MG tablet, Take 400 mg by mouth daily., Disp: , Rfl:  .  metoprolol tartrate (LOPRESSOR) 25 MG tablet, Take 1 tablet (25 mg total) by mouth 2 (two) times daily., Disp: 180 tablet, Rfl: 2 .  Multiple Vitamins-Minerals (PRESERVISION AREDS) TABS, Take 1 tablet by mouth 2 (two) times daily., Disp: , Rfl:  .  nitroGLYCERIN (NITROSTAT) 0.4 MG SL tablet, Place 1 tablet (0.4 mg total) under the tongue every 5 (five) minutes as needed for chest pain., Disp: 25 tablet, Rfl: 3 .  rosuvastatin (CRESTOR) 20 MG tablet, TAKE ONE TABLET BY MOUTH EVERY DAY, Disp: 90 tablet, Rfl: 3 .  ZETIA 10 MG tablet, TAKE ONE TABLET EVERY DAY, Disp: 90 tablet, Rfl: 3    Review of Systems  Constitutional: Negative for chills, fever and weight loss.  HENT: Negative for hearing loss.   Eyes: Negative for blurred vision and double vision.  Respiratory: Negative for cough, hemoptysis, sputum production, shortness of  breath and wheezing.   Cardiovascular: Negative for chest pain and palpitations.  Gastrointestinal: Negative for heartburn.  Musculoskeletal: Negative for myalgias.  Neurological: Negative for headaches.      Allergies:  Clonazepam; Codeine; and Niacin and related  Physical Examination:  VS: BP 138/82 (BP Location: Right Arm, Cuff Size: Normal)   Pulse 67   Wt 221 lb (100.2 kg)   SpO2 93%   BMI 29.97 kg/m   General Appearance: No distress  HEENT: PERRLA, no ptosis, no other lesions noticed Pulmonary:normal breath sounds., Mild dry crackles at the bases bilaterally, diaphragmatic excursion normal.No wheezing, No rales   Cardiovascular:  Normal S1,S2.  No m/r/g.     Abdomen:Exam: Benign, Soft, non-tender, No masses  Skin:   warm, no rashes, no ecchymosis  Extremities: normal, no cyanosis, clubbing, warm with normal capillary refill.     Radiology (The  following images and results were reviewed by Dr. Stevenson Clinch on 06/23/2016). CT CHEST WITHOUT CONTRAST 12/05/15  TECHNIQUE: Multidetector CT imaging of the chest was performed following the standard protocol without intravenous contrast. High resolution imaging of the lungs, as well as inspiratory and expiratory imaging, was performed.  COMPARISON: 02/21/2015.  FINDINGS: Mediastinum/Lymph Nodes: No pathologically enlarged mediastinal or axillary lymph nodes. Calcified mediastinal and right hilar lymph nodes are noted. Heart size normal. No pericardial effusion. Small hiatal hernia.  Lungs/Pleura: Calcified granuloma in the right upper lobe. Moderate centrilobular emphysema. Mild subpleural ground-glass and coarsening in the dependent portions of both lower lobes with parenchymal ground-glass and bronchiectasis, unchanged from 02/21/2015. Minimal air trapping. No pleural fluid. Airway is unremarkable.  Upper abdomen: Liver is unremarkable. Stones are seen in the gallbladder. Visualized portions of the adrenal glands and right kidney are unremarkable. Small left renal stone versus vascular calcification. Visualized portions of the spleen and pancreas are unremarkable. Small hiatal hernia.  Musculoskeletal: No worrisome lytic or sclerotic lesions. Degenerative changes are seen in the spine.  IMPRESSION: 1. Probable bilateral lower lobe pulmonary parenchymal scarring, possibly post infectious or post inflammatory in etiology. No definitive evidence of interstitial lung disease. 2. Cholelithiasis. 3. Small left renal stone versus vascular calcification.   Assessment and Plan: 82 year old male presented today for follow-up visit of OSA OSA on CPAP OSA  Discussed sleep data and reviewed with patient.  Encouraged proper weight management.  Excessive weight may contribute to snoring.  Monitor sedative use.  Discussed driving precautions and its relationship with  hypersomnolence.  Discussed operating dangerous equipment and its relationship with hypersomnolence.  Discussed sleep hygiene, and benefits of a fixed sleep waked time.  The importance of getting eight or more hours of sleep discussed with patient.  Discussed limiting the use of the computer and television before bedtime.  Decrease naps during the day, so night time sleep will become enhanced.  Limit caffeine, and sleep deprivation.  HTN, stroke, and heart failure are potential risk factors.     Plan: CPAP 12 cm of water, 7 nights per week, minimum 6 hours per night. Does not require supplemental O2 with CPAP anymore. Patient states that his primary care physician was previously managing his OSA/CPAP. He has tried different mask size however is still having a significant increase in AHI, review of his CPAP titration study in April 2016 showed that he has very severe OSA, and probably restless leg syndrome, he had a high PLM index. Will plan for follow-up with our sleep specialists for further recommendations  Updated Medication List Outpatient Encounter Prescriptions as of 06/23/2016  Medication Sig  . apixaban (ELIQUIS) 5 MG TABS tablet Take 5 mg by mouth 2 (two) times daily.   . Ascorbic Acid (VITAMIN C) 1000 MG tablet Take 1,000 mg by mouth daily.  . Biotin 1000 MCG tablet Take 1,000 mcg by mouth daily.  [name] DEXILANT 60 MG capsule TAKE 1 CAPSULE EVERY DAY  . diazepam (VALIUM) 2 MG tablet Take 1 tablet (2 mg total) by mouth every 8 (eight) hours as needed for anxiety.  [name] lisinopril (PRINIVIL,ZESTRIL) 2.5 MG tablet TAKE ONE TABLET TWICE DAILY  . magnesium oxide (MAG-OX) 400 MG tablet Take 400 mg by mouth daily.  . metoprolol tartrate (LOPRESSOR) 25 MG tablet Take 1 tablet (25 mg total) by mouth 2 (two) times daily.  . Multiple Vitamins-Minerals (PRESERVISION AREDS) TABS Take 1 tablet by mouth 2 (two) times daily.  . nitroGLYCERIN (NITROSTAT) 0.4 MG SL tablet  Place 1 tablet (0.4 mg total) under the tongue every 5 (five) minutes as needed for chest pain.  . rosuvastatin (CRESTOR) 20 MG tablet TAKE ONE TABLET BY MOUTH EVERY DAY  . ZETIA 10 MG tablet TAKE ONE TABLET EVERY DAY  . [DISCONTINUED] mirabegron ER (MYRBETRIQ) 25 MG TB24 tablet Take 25 mg by mouth daily.   No facility-administered encounter medications on file as of 06/23/2016.     Orders for this visit: No orders of the defined types were placed in this encounter.   Thank  you for the visitation and for allowing  [facility] to assist in the care of your patient. Our recommendations are noted above.  Please contact us if we can be of further service.  Vilinda Boehringer, MD [facility] Office Number: [phone]

## 2016-06-23 NOTE — Assessment & Plan Note (Signed)
OSA  Discussed sleep data and reviewed with patient.  Encouraged proper weight management.  Excessive weight may contribute to snoring.  Monitor sedative use.  Discussed driving precautions and its relationship with hypersomnolence.  Discussed operating dangerous equipment and its relationship with hypersomnolence.  Discussed sleep hygiene, and benefits of a fixed sleep waked time.  The importance of getting eight or more hours of sleep discussed with patient.  Discussed limiting the use of the computer and television before bedtime.  Decrease naps during the day, so night time sleep will become enhanced.  Limit caffeine, and sleep deprivation.  HTN, stroke, and heart failure are potential risk factors.     Plan: CPAP 12 cm of water, 7 nights per week, minimum 6 hours per night. Does not require supplemental O2 with CPAP anymore. Patient states that his primary care physician was previously managing his OSA/CPAP. He has tried different mask size however is still having a significant increase in AHI, review of his CPAP titration study in April 2016 showed that he has very severe OSA, and probably restless leg syndrome, he had a high PLM index. Will plan for follow-up with our sleep specialists for further recommendations

## 2016-06-25 ENCOUNTER — Encounter: Payer: Medicare Other | Admitting: Physical Therapy

## 2016-06-25 DIAGNOSIS — S81811A Laceration without foreign body, right lower leg, initial encounter: Secondary | ICD-10-CM | POA: Diagnosis not present

## 2016-06-25 DIAGNOSIS — Z23 Encounter for immunization: Secondary | ICD-10-CM | POA: Diagnosis not present

## 2016-06-25 DIAGNOSIS — S81812A Laceration without foreign body, left lower leg, initial encounter: Secondary | ICD-10-CM | POA: Diagnosis not present

## 2016-06-30 ENCOUNTER — Encounter: Payer: Medicare Other | Admitting: Physical Therapy

## 2016-07-02 ENCOUNTER — Encounter: Payer: Medicare Other | Admitting: Physical Therapy

## 2016-07-03 ENCOUNTER — Ambulatory Visit: Payer: Medicare Other | Admitting: Physician Assistant

## 2016-07-15 DIAGNOSIS — R339 Retention of urine, unspecified: Secondary | ICD-10-CM | POA: Diagnosis not present

## 2016-07-15 DIAGNOSIS — N401 Enlarged prostate with lower urinary tract symptoms: Secondary | ICD-10-CM | POA: Insufficient documentation

## 2016-07-15 DIAGNOSIS — N3941 Urge incontinence: Secondary | ICD-10-CM | POA: Insufficient documentation

## 2016-07-15 DIAGNOSIS — N138 Other obstructive and reflux uropathy: Secondary | ICD-10-CM | POA: Insufficient documentation

## 2016-07-15 DIAGNOSIS — R972 Elevated prostate specific antigen [PSA]: Secondary | ICD-10-CM | POA: Diagnosis not present

## 2016-07-28 ENCOUNTER — Ambulatory Visit (INDEPENDENT_AMBULATORY_CARE_PROVIDER_SITE_OTHER): Payer: Medicare Other | Admitting: Physician Assistant

## 2016-07-28 DIAGNOSIS — Z23 Encounter for immunization: Secondary | ICD-10-CM

## 2016-07-31 NOTE — Progress Notes (Signed)
* [facility]     Assessment and Plan:  Severe Obstructive sleep apnea.  -Severe obstructive sleep apnea, review of most recent study shows that this appears to be inadequately treated at a pressure of 12. The patient is waking up at night, which may be from apnea episodes versus mask intolerance. -We'll start the patient on an auto CPAP with pressure settings of 10-20  Periodic Limb Movements in Sleep.  -Sleep study showed severe periodic limb movements, these persisted on the patient's titration study with an arousal index of 5. -We'll check ferritin level, treat as indicated.  Insomnia. -Sleep maintenance type, maybe organic versus inadequately treated with apnea versus restless legs/pleuritic limb movements. -We'll treat above problems, if persists, can consider starting a sleep aid such as Ambien.  Interstitial lung disease. -Doubt NSIP, changes are likely related to underlying emphysema. No active treatment required at this time.  COPD/Emphysema.  -Significant emphysematous changes seen on CT chest. -Continue Advair. -The patient is encouraged to try to increase his physical activity level.  Lung nodule.  -Right upper lobe granuloma, calcified. No Further follow-up needed.  Atrial fibrillation.  -Currently on Eliquis for atrial fibrillation, history of multiple falls in the last year. -The patient is going to see his cardiologist in 1 month, I encouraged him to inform his cardiologist of his recent falls, to see whether this patient should be continued on Eliquis.  Weakness/deconditioning -As above secondary to multiple comorbidities, patient has to try to increase his physical activity level in a safe manner.  Date: 07/31/2016  MRN# [id number] [name] 1933/10/28   [name] is a 82 y.o. old male seen in follow up for chief complaint of  Chief Complaint  Patient presents with  . Advice Only    sleep per VM: wakes up about 1-2  nightly: mask fitting 1 month ago     HPI:   The patient is an 82 yo male with a history OSA and COPD. He has been having trouble tolerating CPAP, he was sent for a mask fitting, he has been through several types of masks. It seems that his main problem is taking the mask off in the middle of the night but not know that he is doing it.  He is present here with his girlfriend who gives some of the history. She lives in a separate house.  Review of his recent sleep titration study showed that his OSA was inadequately treated at the current level, and his PLMI and arousal index remained elevated at 5.   He is currently using advair bid, and rinses mouth after using. He has a rescue inhaler but he has not had to use it.   He feels that his breathing is doing well and he is not limited by his breathing. He used to go to the senior center [facility] for [facility], not he only goes once per week after breaking his shoulder several months ago. He has had several falls, for which he is on eliquis for Afib. He has been bruising a lot due to the medicine.   Review and summary of previous testing: --PFT 11/29/14; FVC=93%; Fev1=103%; ratio=80%. RV=58%, TLC=76%, rv/tlc=71%; Diffusion=37%; appears consistent with restriction.  --The patient has a history of OSA and COPD. Review of sleep study on 05/14/11 showed AHI of 6.9 with RDI of 41. Sleep efficiency was 53% and latency was short at 8 min. PLMI was very elevated at 85 with an arousal index of 9.  --Titration study  on 12/12/14; sleep latency of 11 min; with sleep efficiency of 50%, AHI=40. Cpap titrated from 4-14; CPAP of 12 was recommended, however the patient continued to have elevated of 26 at pressure of 14.   Ct images reviewed; 12/15/15; 02/21/15--  There is persistent emphysematous changes, greatest in upper lobes. Stable RUL calcified granuloma. 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 Per report it is unchanged, but to  my eye it appears that it is advancing. Slight mediastinal lymphadenopathy, paratracheal areas.    Medication:   Outpatient Encounter Prescriptions as of 08/01/2016  Medication Sig  . apixaban (ELIQUIS) 5 MG TABS tablet Take 5 mg by mouth 2 (two) times daily.   . Ascorbic Acid (VITAMIN C) 1000 MG tablet Take 1,000 mg by mouth daily.  . Biotin 1000 MCG tablet Take 1,000 mcg by mouth daily.  [name] DEXILANT 60 MG capsule TAKE 1 CAPSULE EVERY DAY  . diazepam (VALIUM) 2 MG tablet Take 1 tablet (2 mg total) by mouth every 8 (eight) hours as needed for anxiety.  [name] lisinopril (PRINIVIL,ZESTRIL) 2.5 MG tablet TAKE ONE TABLET TWICE DAILY  . magnesium oxide (MAG-OX) 400 MG tablet Take 400 mg by mouth daily.  . metoprolol tartrate (LOPRESSOR) 25 MG tablet Take 1 tablet (25 mg total) by mouth 2 (two) times daily.  . Multiple Vitamins-Minerals (PRESERVISION AREDS) TABS Take 1 tablet by mouth 2 (two) times daily.  . nitroGLYCERIN (NITROSTAT) 0.4 MG SL tablet Place 1 tablet (0.4 mg total) under the tongue every 5 (five) minutes as needed for chest pain.  . rosuvastatin (CRESTOR) 20 MG tablet TAKE ONE TABLET BY MOUTH EVERY DAY  . ZETIA 10 MG tablet TAKE ONE TABLET EVERY DAY   No facility-administered encounter medications on file as of 08/01/2016.      Allergies:  Clonazepam; Codeine; and Niacin and related  Review of Systems: Gen:  Denies  fever, sweats. HEENT: Denies blurred vision. Cvc:  No dizziness, chest pain or heaviness Resp:   Denies cough or sputum porduction. Gi: Denies swallowing difficulty, stomach pain. constipation, bowel incontinence Gu:  Denies bladder incontinence, burning urine Ext:   No Joint pain, stiffness. Skin: No skin rash, easy bruising. Endoc:  No polyuria, polydipsia. Psych: No depression, insomnia. Other:  All other systems were reviewed and found to be negative other than what is mentioned in the HPI.   Physical Examination:   VS: BP 128/78 (BP Location: Right Arm, Cuff  Size: Normal)   Pulse 74   Wt 224 lb (101.6 kg)   SpO2 94%   BMI 30.38 kg/m   General Appearance: No distress  Neuro:without focal findings,  speech normal,  HEENT: PERRLA, EOM intact. Pulmonary: normal breath sounds, No wheezing.   CardiovascularNormal S1,S2.  No m/r/g.   Abdomen: Benign, Soft, non-tender. Renal:  No costovertebral tenderness  GU:  Not performed at this time. Endoc: No evident thyromegaly, no signs of acromegaly. Skin:   warm, no rash. Extremities: normal, no cyanosis, clubbing.   LABORATORY PANEL:   CBC No results for input(s): WBC, HGB, HCT, PLT in the last 168 hours. ------------------------------------------------------------------------------------------------------------------  Chemistries  No results for input(s): NA, K, CL, CO2, GLUCOSE, BUN, CREATININE, CALCIUM, MG, AST, ALT, ALKPHOS, BILITOT in the last 168 hours.  Invalid input(s): GFRCGP ------------------------------------------------------------------------------------------------------------------  Cardiac Enzymes No results for input(s): TROPONINI in the last 168 hours. ------------------------------------------------------------  RADIOLOGY:   No results found for this or any previous visit. Results for orders placed during the hospital encounter of 06/02/16  DG Chest 2 View   Narrative CLINICAL DATA:  Fatigue and weakness.  EXAM: CHEST  2 VIEW  COMPARISON:  CT of the chest on 12/05/2015  FINDINGS: The heart size and mediastinal contours are within normal limits and stable post CABG. Stable calcified granuloma at the right lung apex. Stable bilateral parenchymal scarring. There is no evidence of pulmonary edema, consolidation, pneumothorax, nodule or pleural fluid. The visualized skeletal structures are unremarkable.  IMPRESSION: No active cardiopulmonary disease.   Electronically Signed   By: Aletta Edouard M.D.   On: 06/02/2016 16:19     ------------------------------------------------------------------------------------------------------------------  Thank  you for allowing [facility] to assist in the care of your patient. Our recommendations are noted above.  Please contact us if we can be of further service.   Marda Stalker, MD.  [facility] Office Number: [phone]  Patricia Pesa, M.D.  Vilinda Boehringer, M.D.  Merton Border, M.D  07/31/2016

## 2016-08-01 ENCOUNTER — Encounter: Payer: Self-pay | Admitting: Internal Medicine

## 2016-08-01 ENCOUNTER — Other Ambulatory Visit
Admission: RE | Admit: 2016-08-01 | Discharge: 2016-08-01 | Disposition: A | Discharge status: Home / Self Care | Payer: Medicare Other | Source: Ambulatory Visit | Attending: Internal Medicine | Admitting: Internal Medicine

## 2016-08-01 ENCOUNTER — Ambulatory Visit (INDEPENDENT_AMBULATORY_CARE_PROVIDER_SITE_OTHER): Payer: Medicare Other | Admitting: Internal Medicine

## 2016-08-01 VITALS — BP 128/78 | HR 74 | Wt 224.0 lb

## 2016-08-01 DIAGNOSIS — G4733 Obstructive sleep apnea (adult) (pediatric): Secondary | ICD-10-CM

## 2016-08-01 DIAGNOSIS — I779 Disorder of arteries and arterioles, unspecified: Secondary | ICD-10-CM | POA: Diagnosis not present

## 2016-08-01 DIAGNOSIS — J849 Interstitial pulmonary disease, unspecified: Secondary | ICD-10-CM

## 2016-08-01 DIAGNOSIS — Z9989 Dependence on other enabling machines and devices: Secondary | ICD-10-CM | POA: Diagnosis not present

## 2016-08-01 DIAGNOSIS — E611 Iron deficiency: Secondary | ICD-10-CM | POA: Diagnosis not present

## 2016-08-01 DIAGNOSIS — J432 Centrilobular emphysema: Secondary | ICD-10-CM

## 2016-08-01 LAB — FERRITIN: Ferritin: 46 ng/mL (ref 24–336)

## 2016-08-01 MED ORDER — FLUTICASONE-SALMETEROL 250-50 MCG/DOSE IN AEPB: 1.0000 | INHALATION_SPRAY | Freq: Two times a day (BID) | RESPIRATORY_TRACT | 5 refills | Status: DC

## 2016-08-01 NOTE — Addendum Note (Signed)
Addended by: Oscar La R on: 08/01/2016 12:01 PM   Modules accepted: Orders

## 2016-08-01 NOTE — Addendum Note (Signed)
Addended by: Oscar La R on: 08/01/2016 10:34 AM   Modules accepted: Orders

## 2016-08-01 NOTE — Patient Instructions (Addendum)
Refill proair MDI 2 puffs as needed for trouble breathing.   Will review download data.   --Will perform new Machine: Auto-CPAP with pressure from 10-20 and review data.   --Will check ferritin level.

## 2016-08-05 ENCOUNTER — Telehealth: Payer: Self-pay | Admitting: *Deleted

## 2016-08-05 NOTE — Telephone Encounter (Signed)
Initiated PA for Advair through [facility]. Received response below:  Key: Domenick Gong (Key: [id number])   Your demographic data has been sent to [facility] successfully. They will respond shortly with your clinical questions and you will be notified by email when available. You can also check for an update later by opening this request from your dashboard. Please do not fax or call [employer] to resubmit this request. If you need assistance, please chat with CoverMyMeds or call us at [phone].   Will follow up with questions shortly.

## 2016-08-07 DIAGNOSIS — Z85828 Personal history of other malignant neoplasm of skin: Secondary | ICD-10-CM | POA: Diagnosis not present

## 2016-08-07 DIAGNOSIS — Z08 Encounter for follow-up examination after completed treatment for malignant neoplasm: Secondary | ICD-10-CM | POA: Diagnosis not present

## 2016-08-07 DIAGNOSIS — X32XXXA Exposure to sunlight, initial encounter: Secondary | ICD-10-CM | POA: Diagnosis not present

## 2016-08-07 DIAGNOSIS — C44311 Basal cell carcinoma of skin of nose: Secondary | ICD-10-CM | POA: Diagnosis not present

## 2016-08-07 DIAGNOSIS — D485 Neoplasm of uncertain behavior of skin: Secondary | ICD-10-CM | POA: Diagnosis not present

## 2016-08-07 DIAGNOSIS — L570 Actinic keratosis: Secondary | ICD-10-CM | POA: Diagnosis not present

## 2016-08-18 DIAGNOSIS — R410 Disorientation, unspecified: Secondary | ICD-10-CM | POA: Diagnosis not present

## 2016-08-18 DIAGNOSIS — G4733 Obstructive sleep apnea (adult) (pediatric): Secondary | ICD-10-CM | POA: Diagnosis not present

## 2016-08-18 DIAGNOSIS — Z9989 Dependence on other enabling machines and devices: Secondary | ICD-10-CM | POA: Diagnosis not present

## 2016-08-18 DIAGNOSIS — G3184 Mild cognitive impairment, so stated: Secondary | ICD-10-CM | POA: Diagnosis not present

## 2016-08-20 ENCOUNTER — Other Ambulatory Visit: Payer: Self-pay | Admitting: Family Medicine

## 2016-08-20 ENCOUNTER — Encounter: Payer: Self-pay | Admitting: Internal Medicine

## 2016-08-20 DIAGNOSIS — I10 Essential (primary) hypertension: Secondary | ICD-10-CM

## 2016-08-20 DIAGNOSIS — K219 Gastro-esophageal reflux disease without esophagitis: Secondary | ICD-10-CM

## 2016-08-21 NOTE — Telephone Encounter (Signed)
Went to check on PA for this pt. This was created in cover my meds, but it was not sent to the plan.  This has been completed today and faxed to the pts plan.

## 2016-09-03 NOTE — Telephone Encounter (Signed)
Spoke with [facility] [employer],  Advair was denied through the retail pharmacy, but was covered as a 61 day supply through mail delivery with [facility]. A new RX for the 90 day supply will need to be sent in if patient agrees.   Attempted to call patient to ask if this was ok, left a message for him to call back.

## 2016-09-08 ENCOUNTER — Telehealth: Payer: Self-pay

## 2016-09-08 ENCOUNTER — Telehealth: Payer: Self-pay | Admitting: Internal Medicine

## 2016-09-08 MED ORDER — FLUTICASONE-SALMETEROL 250-50 MCG/DOSE IN AEPB: 1.0000 | INHALATION_SPRAY | Freq: Two times a day (BID) | RESPIRATORY_TRACT | 3 refills | Status: DC

## 2016-09-08 NOTE — Telephone Encounter (Signed)
Spoke with patient regarding the Advair Diskus. Patient stated it was ok to change the RX to a 90 day supply so that his insurance would cover it. New RX will be sent in to [facility]. Nothing further needed.

## 2016-09-08 NOTE — Telephone Encounter (Signed)
Entered in error

## 2016-09-09 NOTE — Telephone Encounter (Signed)
LMOM for pt to return call in regards to Advair.

## 2016-09-11 ENCOUNTER — Telehealth: Payer: Self-pay | Admitting: Internal Medicine

## 2016-09-11 NOTE — Telephone Encounter (Signed)
LMOM for pt to return call. 

## 2016-09-11 NOTE — Telephone Encounter (Signed)
lmomtcb x1 

## 2016-09-12 MED ORDER — FLUTICASONE-SALMETEROL 250-50 MCG/DOSE IN AEPB: 1.0000 | INHALATION_SPRAY | Freq: Two times a day (BID) | RESPIRATORY_TRACT | 3 refills | Status: DC

## 2016-09-12 NOTE — Telephone Encounter (Signed)
lmtcb x2 for pt. 

## 2016-09-12 NOTE — Telephone Encounter (Signed)
Spoke with pt and he states to send the Advair to [facility] for a 90 day supply. RX sent. Nothing further needed.

## 2016-09-15 NOTE — Telephone Encounter (Signed)
Spoke with patient, he is not sure why this message was created, he cannot think of any questions regarding his medications at this time. . Pt will call back if he recalls any issues he needed to discuss. Nothing further needed.

## 2016-10-06 DIAGNOSIS — N3941 Urge incontinence: Secondary | ICD-10-CM | POA: Diagnosis not present

## 2016-10-06 DIAGNOSIS — N401 Enlarged prostate with lower urinary tract symptoms: Secondary | ICD-10-CM | POA: Diagnosis not present

## 2016-10-06 DIAGNOSIS — R972 Elevated prostate specific antigen [PSA]: Secondary | ICD-10-CM | POA: Diagnosis not present

## 2016-10-06 DIAGNOSIS — R339 Retention of urine, unspecified: Secondary | ICD-10-CM | POA: Diagnosis not present

## 2016-10-06 DIAGNOSIS — N138 Other obstructive and reflux uropathy: Secondary | ICD-10-CM | POA: Diagnosis not present

## 2016-10-07 DIAGNOSIS — L905 Scar conditions and fibrosis of skin: Secondary | ICD-10-CM | POA: Diagnosis not present

## 2016-10-07 DIAGNOSIS — C44311 Basal cell carcinoma of skin of nose: Secondary | ICD-10-CM | POA: Diagnosis not present

## 2016-10-14 ENCOUNTER — Encounter: Payer: Self-pay | Admitting: Family

## 2016-10-23 ENCOUNTER — Encounter (HOSPITAL_COMMUNITY): Payer: Medicare Other

## 2016-10-23 ENCOUNTER — Ambulatory Visit (HOSPITAL_COMMUNITY)
Admission: RE | Admit: 2016-10-23 | Discharge: 2016-10-23 | Disposition: A | Discharge status: Home / Self Care | Payer: Medicare Other | Source: Ambulatory Visit | Attending: Family | Admitting: Family

## 2016-10-23 ENCOUNTER — Ambulatory Visit (INDEPENDENT_AMBULATORY_CARE_PROVIDER_SITE_OTHER)
Admission: RE | Admit: 2016-10-23 | Discharge: 2016-10-23 | Disposition: A | Discharge status: Home / Self Care | Payer: Medicare Other | Source: Ambulatory Visit | Attending: Family | Admitting: Family

## 2016-10-23 ENCOUNTER — Ambulatory Visit: Payer: Medicare Other | Admitting: Family

## 2016-10-23 ENCOUNTER — Ambulatory Visit (INDEPENDENT_AMBULATORY_CARE_PROVIDER_SITE_OTHER): Payer: Medicare Other | Admitting: Family

## 2016-10-23 ENCOUNTER — Encounter: Payer: Self-pay | Admitting: Family

## 2016-10-23 VITALS — BP 145/85 | HR 64 | Temp 96.6°F | Resp 20 | Ht 72.0 in | Wt 222.0 lb

## 2016-10-23 DIAGNOSIS — I779 Disorder of arteries and arterioles, unspecified: Secondary | ICD-10-CM

## 2016-10-23 DIAGNOSIS — Z9889 Other specified postprocedural states: Secondary | ICD-10-CM | POA: Diagnosis not present

## 2016-10-23 DIAGNOSIS — Z48812 Encounter for surgical aftercare following surgery on the circulatory system: Secondary | ICD-10-CM

## 2016-10-23 DIAGNOSIS — I714 Abdominal aortic aneurysm, without rupture, unspecified: Secondary | ICD-10-CM

## 2016-10-23 DIAGNOSIS — Z95828 Presence of other vascular implants and grafts: Secondary | ICD-10-CM

## 2016-10-23 DIAGNOSIS — I6523 Occlusion and stenosis of bilateral carotid arteries: Secondary | ICD-10-CM

## 2016-10-23 DIAGNOSIS — Z4889 Encounter for other specified surgical aftercare: Secondary | ICD-10-CM

## 2016-10-23 DIAGNOSIS — I6521 Occlusion and stenosis of right carotid artery: Secondary | ICD-10-CM | POA: Insufficient documentation

## 2016-10-23 DIAGNOSIS — I719 Aortic aneurysm of unspecified site, without rupture: Secondary | ICD-10-CM | POA: Insufficient documentation

## 2016-10-23 LAB — VAS US CAROTID
LEFT ECA DIAS: -8 cm/s
LEFT VERTEBRAL DIAS: 12 cm/s
Left CCA dist dias: 15 cm/s
Left CCA dist sys: 64 cm/s
Left CCA prox dias: 12 cm/s
Left CCA prox sys: 65 cm/s
Left ICA dist dias: -17 cm/s
Left ICA dist sys: -61 cm/s
Left ICA prox dias: -13 cm/s
Left ICA prox sys: -79 cm/s
RIGHT CCA MID DIAS: 12 cm/s
RIGHT ECA DIAS: -9 cm/s
Right CCA prox dias: -8 cm/s
Right CCA prox sys: -83 cm/s
Right cca dist sys: -106 cm/s

## 2016-10-23 NOTE — Progress Notes (Signed)
[facility] [place] HISTORY AND PHYSICAL   MRN : [id number]  History of Present Illness:   [name] is a 83 y.o. male patient of Dr. Oneida Alar who is status post left carotid endarterectomy in 2007.  He has also previously had stenting of the left and right common iliac arteries. He returns today for follow up of his extracranial carotid artery stenosis, AAA duplex, and PAOD.  He had some complaints in his lower extremities in the past but no longer c/o claudication.  He is currently on aspirin once daily. He denies any claudication symptoms at this point. However he states he has been diagnosed with sacroiliac joint problems and has some fatigue in his lower extremities with walking long distances. Chronic medical problems include hypertension, hyperlipidemia coronary artery disease all of which are currently stable.   He denies abdominal pain, states he only has back pain if "I over do it".   Pt states he was told that he might have had a couple of TIA's about 2013.  He has macular degeneration.  He had a right wrist ORIF July 2016. Diagnosed with a-fib and uncontrolled hypertension January 2016, hospitalized for 1 day at [facility].  He has a remote hx of left ankle fracture and repair.   He is using CPAP.  He reports a hacking cough with mild dyspnea for about 6 months; I advised him to see his PCP re this.   He was doing a mild work out at a gym until he stumbled and fell February 2017, and bruised his knee and shoulder. Pt has CAD, history of atrial fib, his cardiologist is Dr. [name]. Dr. Stevenson Clinch, in [place], is his pulmonologist.  [name] states he has been "wobbly" and worsening since he had the falls in February 2017.  He has got out of the habit of exercising at the senior center, denies any barriers to exercise.   Pt Diabetic: No Pt smoker: former smoker, quit in 1998  Pt meds include: Statin : Yes ASA:  No Other anticoagulants/antiplatelets: Eliquis, history of atrial fib   Current Outpatient Prescriptions  Medication Sig Dispense Refill  . apixaban (ELIQUIS) 5 MG TABS tablet Take 5 mg by mouth 2 (two) times daily.     . Ascorbic Acid (VITAMIN C) 1000 MG tablet Take 1,000 mg by mouth daily.    . cholecalciferol (VITAMIN D) 1000 units tablet Take 1,000 Units by mouth daily.    [name] DEXILANT 60 MG capsule TAKE 1 CAPSULE EVERY DAY 30 capsule 3  . diazepam (VALIUM) 2 MG tablet Take 1 tablet (2 mg total) by mouth every 8 (eight) hours as needed for anxiety. 12 tablet 0  . finasteride (PROSCAR) 5 MG tablet Take 5 mg by mouth daily.    . Fluticasone-Salmeterol (ADVAIR DISKUS) 250-50 MCG/DOSE AEPB Inhale 1 puff into the lungs 2 (two) times daily. 3 each 3  . lisinopril (PRINIVIL,ZESTRIL) 2.5 MG tablet TAKE ONE TABLET BY MOUTH TWICE DAILY 180 tablet 3  . magnesium 30 MG tablet Take 30 mg by mouth 2 (two) times daily.    . magnesium oxide (MAG-OX) 400 MG tablet Take 400 mg by mouth daily.    . metoprolol tartrate (LOPRESSOR) 25 MG tablet Take 1 tablet (25 mg total) by mouth 2 (two) times daily. 180 tablet 2  . Multiple Vitamins-Minerals (PRESERVISION AREDS) TABS Take 1 tablet by mouth 2 (two) times daily.    . nitroGLYCERIN (NITROSTAT) 0.4 MG SL tablet Place 1 tablet (0.4  mg total) under the tongue every 5 (five) minutes as needed for chest pain. 25 tablet 3  . rosuvastatin (CRESTOR) 20 MG tablet TAKE ONE TABLET BY MOUTH EVERY DAY 90 tablet 3  . tamsulosin (FLOMAX) 0.4 MG CAPS capsule Take 0.4 mg by mouth daily.    [name] ZETIA 10 MG tablet TAKE ONE TABLET EVERY DAY 90 tablet 3   No current facility-administered medications for this visit.     Past Medical History:  Diagnosis Date  . Basal cell carcinoma of eyelid 01/03/2013  . Cancer [facility])    Skin cancer  . Colon polyps   . Hemorrhoid 02/10/2015  . Memory loss   . Osteoporosis   . Sciatic pain    Chronic  . Temporary cerebral vascular dysfunction  02/10/2015   Had negative work-up.  July, 2012.  No medication changes, had forgot them that day, got dehydrated.     Social History Social History  Substance Use Topics  . Smoking status: Former Smoker    Types: Cigarettes    Quit date: 08/04/1997  . Smokeless tobacco: Never Used  . Alcohol use No    Family History Family History  Problem Relation Age of Onset  . Ulcers Mother     Peptic  . Dementia Mother   . Alcohol abuse Father     Surgical History Past Surgical History:  Procedure Laterality Date  . ANKLE SURGERY    . Arch aortogram and carotid aortogram  06/02/2006   Dr Oneida Alar did surgery  . BASAL CELL CARCINOMA EXCISION  04/2016   Dermatology  . CARDIAC CATHETERIZATION  01/30/1998     total native LAD andRCA ,mod native CIRC; svg to RCA and OBTUSE MARG. PATENT the left internal mammary artery graft to LAD ; LV NORMAL  /  . CAROTID ENDARTERECTOMY Left Oct. 29, 2007   Dr. Oneida Alar:  . CORONARY ARTERY BYPASS GRAFT  1998   LIMA-LAD, SVG-OM, SVG-RPDA, SVG-DIAG  . CPET / MET  12/2012   Mild chronotropic incompetence - read 82% of predicted; also reduced effort; peak VO2 15.7 / 75% (did not reach Max effort) -- suggested ischemic response in last 1.5 minutes of exercise. Normal pulmonary function on PFTs but poor response to  . DOPPLER ECHOCARDIOGRAPHY  11/20/2010   EF =>55%; LV norm mild aortic scelorosis  . FRACTURE SURGERY Right 03/19/2015   wrist  . HIATAL HERNIA REPAIR    . ILIAC ARTERY STENT  12/15/2005   PTA and direct stenting rgt and lft common iliac arteries  . NM MYOCAR PERF WALL MOTION  11/11/2010; February 2016   a) TM STRESS----NORMAL PERFUSION ,EF 68%; b) EF 50-55%. Normal LV function. No significant ischemia or infarction.  . ORIF WRIST FRACTURE Right 03/19/2015   Procedure: OPEN REDUCTION INTERNAL FIXATION (ORIF) WRIST FRACTURE;  Surgeon: Hessie Knows, MD;  Location: [facility] ORS;  Service: Orthopedics;  Laterality: Right;  . THORACIC AORTA - CAROTID ANGIOGRAM   October 2007   Dr. 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 TRANSTHORACIC ECHOCARDIOGRAM  February 2016   [facility]: Normal LV function. Dilated left atrium.    Allergies  Allergen Reactions  . Clonazepam Other (See Comments)    Altered mental status  . Codeine   . Niacin And Related     Current Outpatient Prescriptions  Medication Sig Dispense Refill  . apixaban (ELIQUIS) 5 MG TABS tablet Take 5 mg by mouth 2 (two) times daily.     [name]  Ascorbic Acid (VITAMIN C) 1000 MG tablet Take 1,000 mg by mouth daily.    . cholecalciferol (VITAMIN D) 1000 units tablet Take 1,000 Units by mouth daily.    [name] DEXILANT 60 MG capsule TAKE 1 CAPSULE EVERY DAY 30 capsule 3  . diazepam (VALIUM) 2 MG tablet Take 1 tablet (2 mg total) by mouth every 8 (eight) hours as needed for anxiety. 12 tablet 0  . finasteride (PROSCAR) 5 MG tablet Take 5 mg by mouth daily.    . Fluticasone-Salmeterol (ADVAIR DISKUS) 250-50 MCG/DOSE AEPB Inhale 1 puff into the lungs 2 (two) times daily. 3 each 3  . lisinopril (PRINIVIL,ZESTRIL) 2.5 MG tablet TAKE ONE TABLET BY MOUTH TWICE DAILY 180 tablet 3  . magnesium 30 MG tablet Take 30 mg by mouth 2 (two) times daily.    . magnesium oxide (MAG-OX) 400 MG tablet Take 400 mg by mouth daily.    . metoprolol tartrate (LOPRESSOR) 25 MG tablet Take 1 tablet (25 mg total) by mouth 2 (two) times daily. 180 tablet 2  . Multiple Vitamins-Minerals (PRESERVISION AREDS) TABS Take 1 tablet by mouth 2 (two) times daily.    . nitroGLYCERIN (NITROSTAT) 0.4 MG SL tablet Place 1 tablet (0.4 mg total) under the tongue every 5 (five) minutes as needed for chest pain. 25 tablet 3  . rosuvastatin (CRESTOR) 20 MG tablet TAKE ONE TABLET BY MOUTH EVERY DAY 90 tablet 3  . tamsulosin (FLOMAX) 0.4 MG CAPS capsule Take 0.4 mg by mouth daily.    [name] ZETIA 10 MG tablet TAKE ONE TABLET EVERY DAY 90 tablet 3   No current  facility-administered medications for this visit.      REVIEW OF SYSTEMS: See HPI for pertinent positives and negatives.  Physical Examination Vitals:   10/23/16 1012 10/23/16 1015  BP: 135/75 (!) 145/85  Pulse: 64   Resp: 20   Temp: (!) 96.6 F (35.9 C)   TempSrc: Oral   SpO2: 93%   Weight: 222 lb (100.7 kg)   Height: 6' (1.829 m)    Body mass index is 30.11 kg/m.  General: WDWN obese male in NAD, accompanied by male friend [name]: normal Eyes: PERRLA Pulmonary: Respirations are non-labored, CTAB except transient rales in left posterior fields. Cardiac: Irregular rhythm, no detected murmur.  VASCULAR EXAM Carotid Bruits Right Left   Negative Negative   Aorta is not palpable. Radial pulses are 2+ palpable and equal.      LE Pulses Right Left   FEMORAL not palpable(obese) not palpable   POPLITEAL not palpable  not palpable   POSTERIOR TIBIAL not palpable  not palpable    DORSALIS PEDIS  ANTERIOR TIBIAL 1+ palpable  Not palpable     Gastrointestinal: soft, nontender, BS WNL, no r/g, no palpated masses.  Musculoskeletal: No muscle atrophy/wasting. M/S 5/5 throughout, Extremities without ischemic changes.  Neurologic: A&O X 3; Appropriate Affect, sensation is normal, Speech is normal CN 2-12 intactexcept has some hearing loss, Pain and light touch intact in extremities, Motor exam as listed above.     ASSESSMENT:  JAVIAN NUDD is a 83 y.o. male who is status post left carotid endarterectomy in 2007.  He has also previously had stenting of the left and right common iliac arteries. Pt states he was told that he might have had a couple of TIA's about 2013.   He does not have claudication sx's with walking but he states he has been diagnosed with sacroiliac joint problems  and has some fatigue  in his lower extremities with walking long distances. He has no ischemic changes in his feet/legs. He has chronic back pain, no new back pain, no abdominal pain.     DATA Today's carotid duplex suggests a 40 - 59% right internal carotid artery stenosis. Patent left carotid endarterectomy site with no evidence for restenosis. Bilateral vertebral artery flow is antegrade.  Bilateral subclavian artery waveforms are normal.  No significant change since exams of 10-19-2014 and 10-11-15.  Today's bilateral iliac artery stent duplex suggests a 4.3 cm aortic aneurysm, increased from 4.0 on 10-11-15. Decreased visualization of the abdominal vasculature due to overlying bowel gas, patient body habitus, and breathing artifact. Patent right common iliac artery stent with no stenosis visualized. The left common iliac artery stent with >50% stenosis in the proximal segment (264 cm/s), increase in veliocities, however, no plaque visualized.  Increase in left CIA stent stenosis compared to the last exam on 10-11-15.    ABI's: Right: 0.95 (1.0, 10-11-15), biphasic waveforms, TBI: 0.72 Left: 0.99 (0.92 on 10-11-15), biphasic PT, monophasic DP; TBI: 0.62    PLAN:   Graduated walking program discussed and how to achieve. Based on today's exam and non-invasive vascular lab results, the patient will follow up in 1 year with Carotid Duplex scan, bilateral aortoiliac artery duplex, and ABI's.  I discussed in depth with the patient the nature of atherosclerosis, and emphasized the importance of maximal medical management including strict control of blood pressure, blood glucose, and lipid levels, obtaining regular exercise, and cessation of smoking.  The patient is aware that without maximal medical management the underlying atherosclerotic disease process will progress, limiting the benefit of any interventions. Consideration for repair of AAA would be made when the size is 5.5 cm, growth > 1  cm/yr, and symptomatic status. The patient was given information about stroke prevention and what symptoms should prompt the patient to seek immediate medical care. The patient was given information about AAA including signs, symptoms, treatment,  what symptoms should prompt the patient to seek immediate medical care, and how to minimize the risk of enlargement and rupture of aneurysms. The patient was given information about PAD including signs, symptoms, treatment, what symptoms should prompt the patient to seek immediate medical care, and risk reduction measures to take. Thank you for allowing [place] to participate in this patient's care.  Clemon Chambers, RN, MSN, FNP-C [facility] Office: [phone]  Clinic MD: [facility] 10/23/2016 10:32 AM

## 2016-10-23 NOTE — Patient Instructions (Addendum)
Abdominal Aortic Aneurysm Blood pumps away from the heart through tubes (blood vessels) called arteries. Aneurysms are weak or damaged places in the wall of an artery. It bulges out like a balloon. An abdominal aortic aneurysm happens in the main artery of the body (aorta). It can burst or tear, causing bleeding inside the body. This is an emergency. It needs treatment right away. What are the causes? The exact cause is unknown. Things that could cause this problem include:  Fat and other substances building up in the lining of a tube.  Swelling of the walls of a blood vessel.  Certain tissue diseases.  Belly (abdominal) trauma.  An infection in the main artery of the body. What increases the risk? There are things that make it more likely for you to have an aneurysm. These include:  Being over the age of 81 years old.  Having high blood pressure (hypertension).  Being a male.  Being white.  Being very overweight (obese).  Having a family history of aneurysm.  Using tobacco products. What are the signs or symptoms? Symptoms depend on the size of the aneurysm and how fast it grows. There may not be symptoms. If symptoms occur, they can include:  Pain (belly, side, lower back, or groin).  Feeling full after eating a small amount of food.  Feeling sick to your stomach (nauseous), throwing up (vomiting), or both.  Feeling a lump in your belly that feels like it is beating (pulsating).  Feeling like you will pass out (faint). How is this treated?  Medicine to control blood pressure and pain.  Imaging tests to see if the aneurysm gets bigger.  Surgery. How is this prevented? To lessen your chance of getting this condition:  Stop smoking. Stop chewing tobacco.  Limit or avoid alcohol.  Keep your blood pressure, blood sugar, and cholesterol within normal limits.  Eat less salt.  Eat foods low in saturated fats and cholesterol. These are found in animal and whole  dairy products.  Eat more fiber. Fiber is found in whole grains, vegetables, and fruits.  Keep a healthy weight.  Stay active and exercise often. This information is not intended to replace advice given to you by your health care provider. Make sure you discuss any questions you have with your health care provider. Document Released: 12/13/2012 Document Revised: 01/24/2016 Document Reviewed: 09/17/2012 Elsevier Interactive Patient Education  2017 [employer].     Stroke Prevention Some medical conditions and behaviors are associated with an increased chance of having a stroke. You may prevent a stroke by making healthy choices and managing medical conditions. How can I reduce my risk of having a stroke?  Stay physically active. Get at least 30 minutes of activity on most or all days.  Do not smoke. It may also be helpful to avoid exposure to secondhand smoke.  Limit alcohol use. Moderate alcohol use is considered to be:  No more than 2 drinks per day for men.  No more than 1 drink per day for nonpregnant women.  Eat healthy foods. This involves:  Eating 5 or more servings of fruits and vegetables a day.  Making dietary changes that address high blood pressure (hypertension), high cholesterol, diabetes, or obesity.  Manage your cholesterol levels.  Making food choices that are high in fiber and low in saturated fat, trans fat, and cholesterol may control cholesterol levels.  Take any prescribed medicines to control cholesterol as directed by your health care provider.  Manage your diabetes.  Controlling your carbohydrate and sugar intake is recommended to manage diabetes.  Take any prescribed medicines to control diabetes as directed by your health care provider.  Control your hypertension.  Making food choices that are low in salt (sodium), saturated fat, trans fat, and cholesterol is recommended to manage hypertension.  Ask your health care provider if you need  treatment to lower your blood pressure. Take any prescribed medicines to control hypertension as directed by your health care provider.  If you are 34-26 years of age, have your blood pressure checked every 3-5 years. If you are 2 years of age or older, have your blood pressure checked every year.  Maintain a healthy weight.  Reducing calorie intake and making food choices that are low in sodium, saturated fat, trans fat, and cholesterol are recommended to manage weight.  Stop drug abuse.  Avoid taking birth control pills.  Talk to your health care provider about the risks of taking birth control pills if you are over 66 years old, smoke, get migraines, or have ever had a blood clot.  Get evaluated for sleep disorders (sleep apnea).  Talk to your health care provider about getting a sleep evaluation if you snore a lot or have excessive sleepiness.  Take medicines only as directed by your health care provider.  For some people, aspirin or blood thinners (anticoagulants) are helpful in reducing the risk of forming abnormal blood clots that can lead to stroke. If you have the irregular heart rhythm of atrial fibrillation, you should be on a blood thinner unless there is a good reason you cannot take them.  Understand all your medicine instructions.  Make sure that other conditions (such as anemia or atherosclerosis) are addressed. Get help right away if:  You have sudden weakness or numbness of the face, arm, or leg, especially on one side of the body.  Your face or eyelid droops to one side.  You have sudden confusion.  You have trouble speaking (aphasia) or understanding.  You have sudden trouble seeing in one or both eyes.  You have sudden trouble walking.  You have dizziness.  You have a loss of balance or coordination.  You have a sudden, severe headache with no known cause.  You have new chest pain or an irregular heartbeat. Any of these symptoms may represent a  serious problem that is an emergency. Do not wait to see if the symptoms will go away. Get medical help at once. Call your local emergency services (911 in [place] Do not drive yourself to the hospital.  This information is not intended to replace advice given to you by your health care provider. Make sure you discuss any questions you have with your health care provider. Document Released: 09/25/2004 Document Revised: 01/24/2016 Document Reviewed: 02/18/2013 Elsevier Interactive Patient Education  2017 [employer].      Peripheral Vascular Disease Peripheral vascular disease (PVD) is a disease of the blood vessels that are not part of your heart and brain. A simple term for PVD is poor circulation. In most cases, PVD narrows the blood vessels that carry blood from your heart to the rest of your body. This can result in a decreased supply of blood to your arms, legs, and internal organs, like your stomach or kidneys. However, it most often affects a person's lower legs and feet. There are two types of PVD.  Organic PVD. This is the more common type. It is caused by damage to the structure of blood vessels.  Functional PVD. This is caused by conditions that make blood vessels contract and tighten (spasm). Without treatment, PVD tends to get worse over time. PVD can also lead to acute ischemic limb. This is when an arm or limb suddenly has trouble getting enough blood. This is a medical emergency. Follow these instructions at home:  Take medicines only as told by your doctor.  Do not use any tobacco products, including cigarettes, chewing tobacco, or electronic cigarettes. If you need help quitting, ask your doctor.  Lose weight if you are overweight, and maintain a healthy weight as told by your doctor.  Eat a diet that is low in fat and cholesterol. If you need help, ask your doctor.  Exercise regularly. Ask your doctor for some good activities for you.  Take good care of your  feet.  Wear comfortable shoes that fit well.  Check your feet often for any cuts or sores. Contact a doctor if:  You have cramps in your legs while walking.  You have leg pain when you are at rest.  You have coldness in a leg or foot.  Your skin changes.  You are unable to get or have an erection (erectile dysfunction).  You have cuts or sores on your feet that are not healing. Get help right away if:  Your arm or leg turns cold and blue.  Your arms or legs become red, warm, swollen, painful, or numb.  You have chest pain or trouble breathing.  You suddenly have weakness in your face, arm, or leg.  You become very confused or you cannot speak.  You suddenly have a very bad headache.  You suddenly cannot see. This information is not intended to replace advice given to you by your health care provider. Make sure you discuss any questions you have with your health care provider. Document Released: 11/12/2009 Document Revised: 01/24/2016 Document Reviewed: 01/26/2014 Elsevier Interactive Patient Education  2017 [employer].     Before your next abdominal ultrasound:  Take two Extra-Strength Gas-X capsules at bedtime the night before the test. Take another two Extra-Strength Gas-X capsules 3 hours before the test.

## 2016-10-28 NOTE — Addendum Note (Signed)
Addended by: Lianne Cure A on: 10/28/2016 11:27 AM   Modules accepted: Orders

## 2016-11-05 NOTE — Progress Notes (Signed)
* [facility]     Assessment and Plan:  Severe Obstructive sleep apnea.  -Auto CPAP with pressure settings of 10-20 doing well, will continue.   Periodic Limb Movements in Sleep.  -Sleep study showed severe periodic limb movements, these persisted on the patient's titration study with an arousal index of 5. -ferritin level, 08/01/16; 46 (normal).   Insomnia. -Sleep maintenance type, maybe organic versus inadequately treated with apnea versus restless legs/pleuritic limb movements. -We'll treat above problems, if persists, can consider starting a sleep aid such as Ambien.  Interstitial lung disease. -Doubt NSIP, changes are likely related to underlying emphysema. No active treatment required at this time.  COPD/Emphysema.  -Significant emphysematous changes seen on CT chest. -Continue Advair. -The patient is encouraged to try to increase his physical activity level.  Lung nodule.  -Right upper lobe granuloma, calcified. No further follow-up needed.  Atrial fibrillation.  -Currently on Eliquis for atrial fibrillation, history of multiple falls in the last year. -The patient is going to see his cardiologist in 1 month, I encouraged him to inform his cardiologist of his recent falls, to see whether this patient should be continued on Eliquis.  Weakness/deconditioning -As above secondary to multiple comorbidities, patient has to try to increase his physical activity level in a safe manner.  Date: 11/05/2016  MRN# [id number] [name] [birth year]   [name] is a 83 y.o. old male seen in follow up for chief complaint of  Chief Complaint  Patient presents with  . Follow-up    OSA: feels rested and is doping well on new machine. no concerns     HPI:   The patient is an 83 yo male with a history OSA and COPD. He has been having trouble tolerating CPAP, he was sent for a mask fitting, he has been through several types of masks. It seems that his  main problem is taking the mask off in the middle of the night but not know that he is doing it.  He is present here with his girlfriend who gives some of the history. She lives in a separate house.  Review of his recent sleep titration study showed that his OSA was inadequately treated at the current level, and his PLMI and arousal index remained elevated at 5. At last visit his CPAP was changed to auto 10-20, he was asked to try to judiciously increase his activity as it was thought that he had some deconditioning, and he was asked to continue advair. He continues to use advair.   He is taking ambien every night.   He is doing well with the PAP, he uses it every night, and is not tired during the day and has been sleeping well at night.    He feels that his breathing is doing well and he is not limited by his breathing. He used to go to the senior center [facility] for [facility], not he only goes once per week after breaking his shoulder several months ago. He has had several falls, for which he is on eliquis for Afib. He has been bruising a lot due to the medicine.   Review and summary of previous testing: --PFT 11/29/14; FVC=93%; Fev1=103%; ratio=80%. RV=58%, TLC=76%, rv/tlc=71%; Diffusion=37%; appears consistent with restriction.  --The patient has a history of OSA and COPD. Review of sleep study on 05/14/11 showed AHI of 6.9 with RDI of 41. Sleep efficiency was 53% and latency was short at 8 min. PLMI was very elevated at  85 with an arousal index of 9.  --Titration study on 12/12/14; sleep latency of 11 min; with sleep efficiency of 50%, AHI=40. Cpap titrated from 4-14; CPAP of 12 was recommended, however the patient continued to have elevated of 26 at pressure of 14.   Ct images reviewed; 12/15/15; 02/21/15--  There is persistent emphysematous changes, greatest in upper lobes. Stable RUL calcified granuloma. 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 Per report it  is unchanged, but to my eye it appears that it is advancing. Slight mediastinal lymphadenopathy, paratracheal areas.    Medication:   Outpatient Encounter Prescriptions as of 11/06/2016  Medication Sig  . apixaban (ELIQUIS) 5 MG TABS tablet Take 5 mg by mouth 2 (two) times daily.   . Ascorbic Acid (VITAMIN C) 1000 MG tablet Take 1,000 mg by mouth daily.  . cholecalciferol (VITAMIN D) 1000 units tablet Take 1,000 Units by mouth daily.  [name] DEXILANT 60 MG capsule TAKE 1 CAPSULE EVERY DAY  . diazepam (VALIUM) 2 MG tablet Take 1 tablet (2 mg total) by mouth every 8 (eight) hours as needed for anxiety.  . finasteride (PROSCAR) 5 MG tablet Take 5 mg by mouth daily.  . Fluticasone-Salmeterol (ADVAIR DISKUS) 250-50 MCG/DOSE AEPB Inhale 1 puff into the lungs 2 (two) times daily.  [name] lisinopril (PRINIVIL,ZESTRIL) 2.5 MG tablet TAKE ONE TABLET BY MOUTH TWICE DAILY  . magnesium 30 MG tablet Take 30 mg by mouth 2 (two) times daily.  . magnesium oxide (MAG-OX) 400 MG tablet Take 400 mg by mouth daily.  . metoprolol tartrate (LOPRESSOR) 25 MG tablet Take 1 tablet (25 mg total) by mouth 2 (two) times daily.  . Multiple Vitamins-Minerals (PRESERVISION AREDS) TABS Take 1 tablet by mouth 2 (two) times daily.  . nitroGLYCERIN (NITROSTAT) 0.4 MG SL tablet Place 1 tablet (0.4 mg total) under the tongue every 5 (five) minutes as needed for chest pain.  . rosuvastatin (CRESTOR) 20 MG tablet TAKE ONE TABLET BY MOUTH EVERY DAY  . tamsulosin (FLOMAX) 0.4 MG CAPS capsule Take 0.4 mg by mouth daily.  [name] ZETIA 10 MG tablet TAKE ONE TABLET EVERY DAY   No facility-administered encounter medications on file as of 11/06/2016.      Allergies:  Clonazepam; Codeine; and Niacin and related  Review of Systems: Gen:  Denies  fever, sweats. HEENT: Denies blurred vision. Cvc:  No dizziness, chest pain or heaviness Resp:   Denies cough or sputum porduction. Gi: Denies swallowing difficulty, stomach pain. constipation, bowel  incontinence Gu:  Denies bladder incontinence, burning urine Ext:   No Joint pain, stiffness. Skin: No skin rash, easy bruising. Endoc:  No polyuria, polydipsia. Psych: No depression, insomnia. Other:  All other systems were reviewed and found to be negative other than what is mentioned in the HPI.   Physical Examination:   VS: BP 140/86 (BP Location: Left Arm, Cuff Size: Normal)   Pulse 72   Ht 6' (1.829 m)   Wt 229 lb (103.9 kg)   SpO2 97%   BMI 31.06 kg/m   General Appearance: No distress  Neuro:without focal findings,  speech normal,  HEENT: PERRLA, EOM intact. Pulmonary: normal breath sounds, No wheezing.   CardiovascularNormal S1,S2.  No m/r/g.   Abdomen: Benign, Soft, non-tender. Renal:  No costovertebral tenderness  GU:  Not performed at this time. Endoc: No evident thyromegaly, no signs of acromegaly. Skin:   warm, no rash. Extremities: normal, no cyanosis, clubbing.   LABORATORY PANEL:   CBC No  results for input(s): WBC, HGB, HCT, PLT in the last 168 hours. ------------------------------------------------------------------------------------------------------------------  Chemistries  No results for input(s): NA, K, CL, CO2, GLUCOSE, BUN, CREATININE, CALCIUM, MG, AST, ALT, ALKPHOS, BILITOT in the last 168 hours.  Invalid input(s): GFRCGP ------------------------------------------------------------------------------------------------------------------  Cardiac Enzymes No results for input(s): TROPONINI in the last 168 hours. ------------------------------------------------------------  RADIOLOGY:   No results found for this or any previous visit. Results for orders placed during the hospital encounter of 06/02/16  DG Chest 2 View   Narrative CLINICAL DATA:  Fatigue and weakness.  EXAM: CHEST  2 VIEW  COMPARISON:  CT of the chest on 12/05/2015  FINDINGS: The heart size and mediastinal contours are within normal limits and stable post CABG. Stable  calcified granuloma at the right lung apex. Stable bilateral parenchymal scarring. There is no evidence of pulmonary edema, consolidation, pneumothorax, nodule or pleural fluid. The visualized skeletal structures are unremarkable.  IMPRESSION: No active cardiopulmonary disease.   Electronically Signed   By: Aletta Edouard M.D.   On: 06/02/2016 16:19    ------------------------------------------------------------------------------------------------------------------  Thank  you for allowing [facility] to assist in the care of your patient. Our recommendations are noted above.  Please contact us if we can be of further service.   Marda Stalker, MD.  [facility] Office Number: [phone]  Patricia Pesa, M.D.  Vilinda Boehringer, M.D.  Merton Border, M.D  11/05/2016

## 2016-11-06 ENCOUNTER — Encounter: Payer: Self-pay | Admitting: Internal Medicine

## 2016-11-06 ENCOUNTER — Ambulatory Visit (INDEPENDENT_AMBULATORY_CARE_PROVIDER_SITE_OTHER): Payer: Medicare Other | Admitting: Internal Medicine

## 2016-11-06 VITALS — BP 140/86 | HR 72 | Ht 72.0 in | Wt 229.0 lb

## 2016-11-06 DIAGNOSIS — I779 Disorder of arteries and arterioles, unspecified: Secondary | ICD-10-CM | POA: Diagnosis not present

## 2016-11-06 DIAGNOSIS — Z9989 Dependence on other enabling machines and devices: Secondary | ICD-10-CM | POA: Diagnosis not present

## 2016-11-06 DIAGNOSIS — G4733 Obstructive sleep apnea (adult) (pediatric): Secondary | ICD-10-CM

## 2016-11-06 DIAGNOSIS — J849 Interstitial pulmonary disease, unspecified: Secondary | ICD-10-CM

## 2016-11-06 DIAGNOSIS — J432 Centrilobular emphysema: Secondary | ICD-10-CM | POA: Diagnosis not present

## 2016-11-06 NOTE — Patient Instructions (Signed)
Continue advair

## 2016-11-18 ENCOUNTER — Other Ambulatory Visit: Payer: Self-pay | Admitting: Cardiology

## 2016-11-18 NOTE — Telephone Encounter (Signed)
Rx(s) sent to pharmacy electronically.  

## 2016-12-04 DIAGNOSIS — H353112 Nonexudative age-related macular degeneration, right eye, intermediate dry stage: Secondary | ICD-10-CM | POA: Diagnosis not present

## 2016-12-13 ENCOUNTER — Emergency Department: Payer: Medicare Other

## 2016-12-13 ENCOUNTER — Emergency Department
Admission: EM | Admit: 2016-12-13 | Discharge: 2016-12-13 | Disposition: A | Discharge status: Home / Self Care | Payer: Medicare Other | Attending: Emergency Medicine | Admitting: Emergency Medicine

## 2016-12-13 ENCOUNTER — Encounter: Payer: Self-pay | Admitting: Emergency Medicine

## 2016-12-13 DIAGNOSIS — Y92049 Unspecified place in boarding-house as the place of occurrence of the external cause: Secondary | ICD-10-CM | POA: Insufficient documentation

## 2016-12-13 DIAGNOSIS — I10 Essential (primary) hypertension: Secondary | ICD-10-CM | POA: Diagnosis not present

## 2016-12-13 DIAGNOSIS — J449 Chronic obstructive pulmonary disease, unspecified: Secondary | ICD-10-CM | POA: Insufficient documentation

## 2016-12-13 DIAGNOSIS — S8001XA Contusion of right knee, initial encounter: Secondary | ICD-10-CM | POA: Insufficient documentation

## 2016-12-13 DIAGNOSIS — Y929 Unspecified place or not applicable: Secondary | ICD-10-CM | POA: Insufficient documentation

## 2016-12-13 DIAGNOSIS — Y9301 Activity, walking, marching and hiking: Secondary | ICD-10-CM | POA: Insufficient documentation

## 2016-12-13 DIAGNOSIS — E039 Hypothyroidism, unspecified: Secondary | ICD-10-CM | POA: Insufficient documentation

## 2016-12-13 DIAGNOSIS — Y999 Unspecified external cause status: Secondary | ICD-10-CM | POA: Insufficient documentation

## 2016-12-13 DIAGNOSIS — S8991XA Unspecified injury of right lower leg, initial encounter: Secondary | ICD-10-CM | POA: Diagnosis present

## 2016-12-13 DIAGNOSIS — M1711 Unilateral primary osteoarthritis, right knee: Secondary | ICD-10-CM | POA: Insufficient documentation

## 2016-12-13 DIAGNOSIS — Z85828 Personal history of other malignant neoplasm of skin: Secondary | ICD-10-CM | POA: Diagnosis not present

## 2016-12-13 DIAGNOSIS — Z79899 Other long term (current) drug therapy: Secondary | ICD-10-CM | POA: Insufficient documentation

## 2016-12-13 DIAGNOSIS — W010XXA Fall on same level from slipping, tripping and stumbling without subsequent striking against object, initial encounter: Secondary | ICD-10-CM | POA: Insufficient documentation

## 2016-12-13 DIAGNOSIS — I25810 Atherosclerosis of coronary artery bypass graft(s) without angina pectoris: Secondary | ICD-10-CM | POA: Diagnosis not present

## 2016-12-13 DIAGNOSIS — Z87891 Personal history of nicotine dependence: Secondary | ICD-10-CM | POA: Diagnosis not present

## 2016-12-13 DIAGNOSIS — M25561 Pain in right knee: Secondary | ICD-10-CM | POA: Diagnosis not present

## 2016-12-13 DIAGNOSIS — M13861 Other specified arthritis, right knee: Secondary | ICD-10-CM | POA: Diagnosis not present

## 2016-12-13 DIAGNOSIS — M25461 Effusion, right knee: Secondary | ICD-10-CM | POA: Diagnosis not present

## 2016-12-13 MED ORDER — PREDNISONE 10 MG PO TABS: 10.0000 mg | ORAL_TABLET | Freq: Every day | ORAL | 0 refills | Status: DC

## 2016-12-13 MED ORDER — BACITRACIN ZINC 500 UNIT/GM EX OINT
TOPICAL_OINTMENT | Freq: Two times a day (BID) | CUTANEOUS | Status: DC
Start: 2016-12-13 — End: 2016-12-14
  Administered 2016-12-13: 1 via TOPICAL
  Filled 2016-12-13: qty 0.9

## 2016-12-13 MED ORDER — PREDNISONE 20 MG PO TABS
60.0000 mg | ORAL_TABLET | Freq: Once | ORAL | Status: AC
  Administered 2016-12-13: 60 mg via ORAL
  Filled 2016-12-13: qty 3

## 2016-12-13 NOTE — ED Triage Notes (Signed)
Pt to tx room via [place], reports fall today, tripped and fell, injury to right knee, abrasion noted.  Pt states pain mild when sitting but severe with standing or walking, denies numbness distal injury.

## 2016-12-13 NOTE — ED Notes (Signed)

## 2016-12-13 NOTE — ED Notes (Signed)
Pt says he tripped and fell, injury to right knee; wife reports abrasion to front of knee; pt reports pain behind his knee; pt was ambulatory upon arrival and assisted to wheelchair;

## 2016-12-13 NOTE — Discharge Instructions (Signed)
Please rest ice and elevate the knee. Take medications as prescribed. Follow-up with orthopedics or PCP if no improvement in 5-7 days.

## 2016-12-13 NOTE — ED Provider Notes (Signed)
[facility] Provider Note   CSN: [id number] Arrival date & time: 12/13/16  2000     History   Chief Complaint Chief Complaint  Patient presents with  . Knee Pain    right    HPI [name] is a 83 y.o. male resents to the emergency department for evaluation of right knee pain. Patient was walking down the driveway earlier today when he tripped on his shoelaces, went down onto his right knee. Patient's skin the anterior aspect of his right knee and developed anterior and posterior knee pain. Pain is 1 out of 10 with sitting and 5 out of 10 with standing. He is able to ambulate with no assisted devices has moderate 5 out of 10 pain. He has not had any medications for pain. He is on blood thinners. He denies injuring any other parts of his body, denies any hip pain, headache, neck pain, lower back pain. Minimal swelling to the right knee.  HPI  Past Medical History:  Diagnosis Date  . Basal cell carcinoma of eyelid 01/03/2013  . Cancer [facility])    Skin cancer  . Colon polyps   . Hemorrhoid 02/10/2015  . Memory loss   . Osteoporosis   . Sciatic pain    Chronic  . Temporary cerebral vascular dysfunction 02/10/2015   Had negative work-up.  July, 2012.  No medication changes, had forgot them that day, got dehydrated.     Patient Active Problem List   Diagnosis Date Noted  . Hypothyroidism 04/25/2016  . Macular degeneration 04/25/2016  . Erectile dysfunction due to arterial insufficiency   . Ischemic heart disease with chronotropic incompetence   . CN (constipation) 02/10/2015  . DD (diverticular disease) 02/10/2015  . Accumulation of fluid in tissues 02/10/2015  . Elevated WBC count 02/10/2015  . Fatigue 02/10/2015  . Hypoxia 02/10/2015  . Lichen planus 62/83/6629  . Restless leg 02/10/2015  . 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 B12 deficiency 02/10/2015  . SOB (shortness of breath) 12/14/2014  . Lower extremity edema 12/14/2014  . COPD (chronic  obstructive pulmonary disease) ([place]) 11/14/2014  . Post Inflammatory Lung Changes 11/14/2014  . Confusion state 11/08/2014  . Amnesia 11/08/2014  . PAF (paroxysmal atrial fibrillation) (HCC) CHA2DS2-VASc = 4. AC = Eliquis   . Insomnia 12/09/2013  . OSA on CPAP   . Erectile dysfunction   . Dyspnea on exertion - -essentially resolved with BB dose reduction & wgt loss 03/29/2013  . Overweight (BMI 25.0-29.9) -- Wgt back up 01/17/2013  . Atherosclerotic heart disease of native coronary artery without angina pectoris - s/p CABG, occluded SVG-D1; patent LIMA-LAD, SVG-OM, SVG- RCA   . Essential hypertension   . Hyperlipidemia with target LDL less than 70   . Aorto-iliac disease ([place])   . Allergic rhinitis 08/17/2009  . Arthralgia of ankle or foot 06/04/2009  . Adaptation reaction 05/28/2009  . Benign prostatic hypertrophy without urinary obstruction 05/28/2009  . Acid reflux 05/28/2009  . Arthritis, degenerative 05/28/2009  . 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 Carotid artery occlusion without infarction 06/01/2006  . PAD (peripheral artery disease) - bilateral common iliac stents 12/15/2005  . Aortoiliac occlusive disease ([place]) 11/30/2005  . Atherosclerotic heart disease of artery bypass graft 09/01/1997    Past Surgical History:  Procedure Laterality Date  . ANKLE SURGERY    . Arch aortogram and carotid aortogram  06/02/2006   Dr Oneida Alar did surgery  . BASAL CELL CARCINOMA EXCISION  04/2016   Dermatology  .  CARDIAC CATHETERIZATION  01/30/1998     total native LAD andRCA ,mod native CIRC; svg to RCA and OBTUSE MARG. PATENT the left internal mammary artery graft to LAD ; LV NORMAL  /  . CAROTID ENDARTERECTOMY Left Oct. 29, 2007   Dr. Oneida Alar:  . CORONARY ARTERY BYPASS GRAFT  1998   LIMA-LAD, SVG-OM, SVG-RPDA, SVG-DIAG  . CPET / MET  12/2012   Mild chronotropic incompetence - read 82% of predicted; also reduced effort; peak VO2 15.7 / 75% (did not reach Max effort) --  suggested ischemic response in last 1.5 minutes of exercise. Normal pulmonary function on PFTs but poor response to  . DOPPLER ECHOCARDIOGRAPHY  11/20/2010   EF =>55%; LV norm mild aortic scelorosis  . FRACTURE SURGERY Right 03/19/2015   wrist  . HIATAL HERNIA REPAIR    . ILIAC ARTERY STENT  12/15/2005   PTA and direct stenting rgt and lft common iliac arteries  . NM MYOCAR PERF WALL MOTION  11/11/2010; February 2016   a) TM STRESS----NORMAL PERFUSION ,EF 68%; b) EF 50-55%. Normal LV function. No significant ischemia or infarction.  . ORIF WRIST FRACTURE Right 03/19/2015   Procedure: OPEN REDUCTION INTERNAL FIXATION (ORIF) WRIST FRACTURE;  Surgeon: Hessie Knows, MD;  Location: [facility] ORS;  Service: Orthopedics;  Laterality: Right;  . THORACIC AORTA - CAROTID ANGIOGRAM  October 2007   Dr. 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 TRANSTHORACIC ECHOCARDIOGRAM  February 2016   [facility]: Normal LV function. Dilated left atrium.       Home Medications    Prior to Admission medications   Medication Sig Start Date End Date Taking? Authorizing Provider  apixaban (ELIQUIS) 5 MG TABS tablet Take 5 mg by mouth 2 (two) times daily.     Historical Provider, MD  Ascorbic Acid (VITAMIN C) 1000 MG tablet Take 1,000 mg by mouth daily.    Historical Provider, MD  cholecalciferol (VITAMIN D) 1000 units tablet Take 1,000 Units by mouth daily.    Historical Provider, MD  DEXILANT 60 MG capsule TAKE 1 CAPSULE EVERY DAY 08/20/16   [name], MD  diazepam (VALIUM) 2 MG tablet Take 1 tablet (2 mg total) by mouth every 8 (eight) hours as needed for anxiety. 06/05/16   Carmon Ginsberg, PA  finasteride (PROSCAR) 5 MG tablet Take 5 mg by mouth daily. 07/15/16   Historical Provider, MD  Fluticasone-Salmeterol (ADVAIR DISKUS) 250-50 MCG/DOSE AEPB Inhale 1 puff into the lungs 2 (two) times daily. 09/12/16 09/12/17  Laverle Hobby,  MD  lisinopril (PRINIVIL,ZESTRIL) 2.5 MG tablet TAKE ONE TABLET BY MOUTH TWICE DAILY 08/20/16   [name], MD  magnesium 30 MG tablet Take 30 mg by mouth 2 (two) times daily.    Historical Provider, MD  magnesium oxide (MAG-OX) 400 MG tablet Take 400 mg by mouth daily.    Historical Provider, MD  metoprolol tartrate (LOPRESSOR) 25 MG tablet Take 1 tablet (25 mg total) by mouth 2 (two) times daily. 11/18/16   Leonie Man, MD  Multiple Vitamins-Minerals (PRESERVISION AREDS) TABS Take 1 tablet by mouth 2 (two) times daily.    Historical Provider, MD  nitroGLYCERIN (NITROSTAT) 0.4 MG SL tablet Place 1 tablet (0.4 mg total) under the tongue every 5 (five) minutes as needed for chest pain. 09/13/14   Leonie Man, MD  predniSONE (DELTASONE) 10 MG tablet Take 1 tablet (10 mg total) by mouth daily. 6,5,4,3,2,1 six day  taper 12/13/16   Evon Slack, PA-C  rosuvastatin (CRESTOR) 20 MG tablet TAKE ONE TABLET BY MOUTH EVERY DAY 06/14/16   Malva Limes, MD  tamsulosin (FLOMAX) 0.4 MG CAPS capsule Take 0.4 mg by mouth daily. 07/15/16   Historical Provider, MD  ZETIA 10 MG tablet TAKE ONE TABLET EVERY DAY 03/23/16   Malva Limes, MD    Family History Family History  Problem Relation Age of Onset  . Ulcers Mother     Peptic  . Dementia Mother   . Alcohol abuse Father     Social History Social History  Substance Use Topics  . Smoking status: Former Smoker    Types: Cigarettes    Quit date: 08/04/1997  . Smokeless tobacco: Never Used  . Alcohol use No     Allergies   Clonazepam; Codeine; and Niacin and related   Review of Systems Review of Systems  Constitutional: Negative.  Negative for activity change, appetite change, chills and fever.  HENT: Negative for congestion, ear pain, mouth sores, rhinorrhea, sinus pressure, sore throat and trouble swallowing.   Eyes: Negative for photophobia, pain and discharge.  Respiratory: Negative for cough, chest tightness and shortness of  breath.   Cardiovascular: Negative for chest pain and leg swelling.  Gastrointestinal: Negative for abdominal distention, abdominal pain, diarrhea, nausea and vomiting.  Genitourinary: Negative for difficulty urinating and dysuria.  Musculoskeletal: Positive for arthralgias. Negative for back pain, gait problem and joint swelling.  Skin: Negative for color change and rash.  Neurological: Negative for dizziness and headaches.  Hematological: Negative for adenopathy.  Psychiatric/Behavioral: Negative for agitation and behavioral problems.     Physical Exam Updated Vital Signs BP (!) 159/90 (BP Location: Right Arm)   Pulse 66   Temp 98.7 F (37.1 C) (Oral)   Resp 18   Ht 6' (1.829 m)   Wt 101.6 kg   SpO2 93%   BMI 30.38 kg/m   Physical Exam  Constitutional: He is oriented to person, place, and time. He appears well-developed and well-nourished.  HENT:  Head: Normocephalic and atraumatic.  Eyes: Conjunctivae and EOM are normal. Pupils are equal, round, and reactive to light.  Neck: Normal range of motion. Neck supple.  Cardiovascular: Normal rate, regular rhythm, normal heart sounds and intact distal pulses.   Pulmonary/Chest: Effort normal and breath sounds normal. No respiratory distress. He has no wheezes. He has no rales. He exhibits no tenderness.  Abdominal: Soft. Bowel sounds are normal. He exhibits no distension. There is no tenderness.  Musculoskeletal:  Examination of the right knee shows the patient has anterior abrasion on the patellar tendon. Patient is able to straight leg raise. There is no sign of foreign body. Knee is stable to valgus and varus stress testing. No effusion. Patient has 0-115 range of motion. No swelling throughout the lower leg. Patient is nontender to palpation throughout the medial and lateral joint line. No pain with hamstring resistance. Negative Baker's cyst on palpation.  Neurological: He is alert and oriented to person, place, and time.  Skin:  Skin is warm and dry.  Psychiatric: He has a normal mood and affect. His behavior is normal. Judgment and thought content normal.     ED Treatments / Results  Labs (all labs ordered are listed, but only abnormal results are displayed) Labs Reviewed - No data to display  EKG  EKG Interpretation None       Radiology Dg Knee Complete 4 Views Right  Result Date: 12/13/2016 CLINICAL  DATA:  Patient fell tripping and injuring right knee. EXAM: RIGHT KNEE - COMPLETE 4+ VIEW COMPARISON:  None. FINDINGS: Chondrocalcinosis of the hyaline cartilage within the femorotibial compartment is identified. Medial femorotibial joint space narrowing of the right knee. There to appears to be a trace joint effusion. No acute displaced nor joint dislocation. Arteriosclerosis along the femoral through tibial arteries. Vascular clips are seen along the medial aspect of the right leg. IMPRESSION: No acute fracture. Trace joint effusion. 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 Two Electronically Signed   By: Ashley Royalty M.D.   On: 12/13/2016 21:50    Procedures Procedures (including critical care time)  Medications Ordered in ED Medications  bacitracin ointment (1 application Topical Given 12/13/16 2152)  predniSONE (DELTASONE) tablet 60 mg (60 mg Oral Given 12/13/16 2151)     Initial Impression / Assessment and Plan / ED Course  I have reviewed the triage vital signs and the nursing notes.  Pertinent labs & imaging results that were available during my care of the patient were reviewed by me and considered in my medical decision making (see chart for details).     83 year old male with fall to the right knee with skin abrasion. Knee is stable to valgus and varus stress testing. Range of motion is normal. X-ray show no fracture but patient with significant arthritis and chondrocalcinosis. He'll rest ice and elevate knee. Ace wrap applied. He will apply antibiotic  limited to the abrasion and monitor for any increasing pain swelling or redness. He is started on prednisone, unable tolerate NSAIDs. Will follow-up with orthopedics if no improvement in 5-7 days.  Final Clinical Impressions(s) / ED Diagnoses   Final diagnoses:  Contusion of right knee, initial encounter  Arthritis of knee, right  Acute pain of right knee    New Prescriptions New Prescriptions   PREDNISONE (DELTASONE) 10 MG TABLET    Take 1 tablet (10 mg total) by mouth daily. 6,5,4,3,2,1 six day taper     Duanne Guess, PA-C 12/13/16 2249    Harvest Dark, MD 12/13/16 2250

## 2016-12-16 DIAGNOSIS — M25521 Pain in right elbow: Secondary | ICD-10-CM | POA: Diagnosis not present

## 2016-12-16 DIAGNOSIS — J449 Chronic obstructive pulmonary disease, unspecified: Secondary | ICD-10-CM | POA: Diagnosis not present

## 2016-12-16 DIAGNOSIS — S52572A Other intraarticular fracture of lower end of left radius, initial encounter for closed fracture: Secondary | ICD-10-CM | POA: Diagnosis not present

## 2016-12-16 DIAGNOSIS — S42401A Unspecified fracture of lower end of right humerus, initial encounter for closed fracture: Secondary | ICD-10-CM | POA: Diagnosis not present

## 2016-12-16 DIAGNOSIS — Z951 Presence of aortocoronary bypass graft: Secondary | ICD-10-CM | POA: Diagnosis not present

## 2016-12-16 DIAGNOSIS — N400 Enlarged prostate without lower urinary tract symptoms: Secondary | ICD-10-CM | POA: Diagnosis not present

## 2016-12-16 DIAGNOSIS — Y92511 Restaurant or cafe as the place of occurrence of the external cause: Secondary | ICD-10-CM | POA: Diagnosis not present

## 2016-12-16 DIAGNOSIS — R0902 Hypoxemia: Secondary | ICD-10-CM | POA: Diagnosis not present

## 2016-12-16 DIAGNOSIS — I2510 Atherosclerotic heart disease of native coronary artery without angina pectoris: Secondary | ICD-10-CM | POA: Diagnosis not present

## 2016-12-16 DIAGNOSIS — E7800 Pure hypercholesterolemia, unspecified: Secondary | ICD-10-CM | POA: Diagnosis not present

## 2016-12-16 DIAGNOSIS — Z7901 Long term (current) use of anticoagulants: Secondary | ICD-10-CM | POA: Diagnosis not present

## 2016-12-16 DIAGNOSIS — M129 Arthropathy, unspecified: Secondary | ICD-10-CM | POA: Diagnosis not present

## 2016-12-16 DIAGNOSIS — J9601 Acute respiratory failure with hypoxia: Secondary | ICD-10-CM | POA: Diagnosis not present

## 2016-12-16 DIAGNOSIS — I482 Chronic atrial fibrillation: Secondary | ICD-10-CM | POA: Diagnosis not present

## 2016-12-16 DIAGNOSIS — I4891 Unspecified atrial fibrillation: Secondary | ICD-10-CM | POA: Diagnosis not present

## 2016-12-16 DIAGNOSIS — W01198A Fall on same level from slipping, tripping and stumbling with subsequent striking against other object, initial encounter: Secondary | ICD-10-CM | POA: Diagnosis not present

## 2016-12-16 DIAGNOSIS — J189 Pneumonia, unspecified organism: Secondary | ICD-10-CM | POA: Diagnosis not present

## 2016-12-16 DIAGNOSIS — Z9861 Coronary angioplasty status: Secondary | ICD-10-CM | POA: Diagnosis not present

## 2016-12-16 DIAGNOSIS — W1809XA Striking against other object with subsequent fall, initial encounter: Secondary | ICD-10-CM | POA: Diagnosis not present

## 2016-12-16 DIAGNOSIS — G4733 Obstructive sleep apnea (adult) (pediatric): Secondary | ICD-10-CM | POA: Diagnosis not present

## 2016-12-16 DIAGNOSIS — R404 Transient alteration of awareness: Secondary | ICD-10-CM | POA: Diagnosis not present

## 2016-12-16 DIAGNOSIS — Z5181 Encounter for therapeutic drug level monitoring: Secondary | ICD-10-CM | POA: Diagnosis not present

## 2016-12-16 DIAGNOSIS — I959 Hypotension, unspecified: Secondary | ICD-10-CM | POA: Diagnosis not present

## 2016-12-16 DIAGNOSIS — S52031A Displaced fracture of olecranon process with intraarticular extension of right ulna, initial encounter for closed fracture: Secondary | ICD-10-CM | POA: Diagnosis not present

## 2016-12-16 DIAGNOSIS — S52021A Displaced fracture of olecranon process without intraarticular extension of right ulna, initial encounter for closed fracture: Secondary | ICD-10-CM | POA: Diagnosis not present

## 2016-12-16 DIAGNOSIS — Y999 Unspecified external cause status: Secondary | ICD-10-CM | POA: Diagnosis not present

## 2016-12-16 DIAGNOSIS — S52501A Unspecified fracture of the lower end of right radius, initial encounter for closed fracture: Secondary | ICD-10-CM | POA: Diagnosis not present

## 2016-12-16 DIAGNOSIS — W010XXA Fall on same level from slipping, tripping and stumbling without subsequent striking against object, initial encounter: Secondary | ICD-10-CM | POA: Diagnosis not present

## 2016-12-16 DIAGNOSIS — S0990XA Unspecified injury of head, initial encounter: Secondary | ICD-10-CM | POA: Diagnosis not present

## 2016-12-16 DIAGNOSIS — I10 Essential (primary) hypertension: Secondary | ICD-10-CM | POA: Diagnosis not present

## 2016-12-16 DIAGNOSIS — J440 Chronic obstructive pulmonary disease with acute lower respiratory infection: Secondary | ICD-10-CM | POA: Diagnosis not present

## 2016-12-16 DIAGNOSIS — Y9301 Activity, walking, marching and hiking: Secondary | ICD-10-CM | POA: Diagnosis not present

## 2016-12-17 DIAGNOSIS — Z951 Presence of aortocoronary bypass graft: Secondary | ICD-10-CM | POA: Diagnosis not present

## 2016-12-17 DIAGNOSIS — Z7901 Long term (current) use of anticoagulants: Secondary | ICD-10-CM | POA: Diagnosis not present

## 2016-12-17 DIAGNOSIS — S50311A Abrasion of right elbow, initial encounter: Secondary | ICD-10-CM | POA: Diagnosis present

## 2016-12-17 DIAGNOSIS — I4891 Unspecified atrial fibrillation: Secondary | ICD-10-CM | POA: Diagnosis not present

## 2016-12-17 DIAGNOSIS — J440 Chronic obstructive pulmonary disease with acute lower respiratory infection: Secondary | ICD-10-CM | POA: Diagnosis not present

## 2016-12-17 DIAGNOSIS — Z86711 Personal history of pulmonary embolism: Secondary | ICD-10-CM | POA: Diagnosis not present

## 2016-12-17 DIAGNOSIS — J9601 Acute respiratory failure with hypoxia: Secondary | ICD-10-CM | POA: Diagnosis not present

## 2016-12-17 DIAGNOSIS — J410 Simple chronic bronchitis: Secondary | ICD-10-CM | POA: Insufficient documentation

## 2016-12-17 DIAGNOSIS — J9600 Acute respiratory failure, unspecified whether with hypoxia or hypercapnia: Secondary | ICD-10-CM | POA: Insufficient documentation

## 2016-12-17 DIAGNOSIS — J432 Centrilobular emphysema: Secondary | ICD-10-CM | POA: Diagnosis not present

## 2016-12-17 DIAGNOSIS — R404 Transient alteration of awareness: Secondary | ICD-10-CM | POA: Diagnosis not present

## 2016-12-17 DIAGNOSIS — J449 Chronic obstructive pulmonary disease, unspecified: Secondary | ICD-10-CM | POA: Diagnosis not present

## 2016-12-17 DIAGNOSIS — J9811 Atelectasis: Secondary | ICD-10-CM | POA: Diagnosis not present

## 2016-12-17 DIAGNOSIS — S0990XA Unspecified injury of head, initial encounter: Secondary | ICD-10-CM | POA: Diagnosis not present

## 2016-12-17 DIAGNOSIS — S42401D Unspecified fracture of lower end of right humerus, subsequent encounter for fracture with routine healing: Secondary | ICD-10-CM | POA: Diagnosis not present

## 2016-12-17 DIAGNOSIS — I10 Essential (primary) hypertension: Secondary | ICD-10-CM | POA: Diagnosis present

## 2016-12-17 DIAGNOSIS — S52021A Displaced fracture of olecranon process without intraarticular extension of right ulna, initial encounter for closed fracture: Secondary | ICD-10-CM | POA: Diagnosis not present

## 2016-12-17 DIAGNOSIS — G4733 Obstructive sleep apnea (adult) (pediatric): Secondary | ICD-10-CM | POA: Diagnosis present

## 2016-12-17 DIAGNOSIS — I2510 Atherosclerotic heart disease of native coronary artery without angina pectoris: Secondary | ICD-10-CM | POA: Diagnosis present

## 2016-12-17 DIAGNOSIS — I482 Chronic atrial fibrillation: Secondary | ICD-10-CM | POA: Diagnosis not present

## 2016-12-17 DIAGNOSIS — J189 Pneumonia, unspecified organism: Secondary | ICD-10-CM | POA: Diagnosis not present

## 2016-12-18 ENCOUNTER — Other Ambulatory Visit: Payer: Self-pay | Admitting: Family Medicine

## 2016-12-18 DIAGNOSIS — K219 Gastro-esophageal reflux disease without esophagitis: Secondary | ICD-10-CM

## 2016-12-18 NOTE — Telephone Encounter (Signed)
[facility] faxed a request for the following medication. Thanks CC  DEXILANT 60 MG capsule  Take 1 capsule every day.

## 2016-12-19 MED ORDER — DEXLANSOPRAZOLE 60 MG PO CPDR: 1.0000 | DELAYED_RELEASE_CAPSULE | Freq: Every day | ORAL | 11 refills | Status: DC

## 2016-12-22 ENCOUNTER — Telehealth: Payer: Self-pay | Admitting: Cardiology

## 2016-12-22 DIAGNOSIS — S51011A Laceration without foreign body of right elbow, initial encounter: Secondary | ICD-10-CM | POA: Diagnosis not present

## 2016-12-22 DIAGNOSIS — M25521 Pain in right elbow: Secondary | ICD-10-CM | POA: Diagnosis not present

## 2016-12-22 DIAGNOSIS — S52021A Displaced fracture of olecranon process without intraarticular extension of right ulna, initial encounter for closed fracture: Secondary | ICD-10-CM | POA: Diagnosis not present

## 2016-12-22 NOTE — Telephone Encounter (Signed)
He should be Low-Intermediate Risk for a Low Cardiac Risk surgery.  OK to hold Eliquis as requested - restart when safe post-op.  [name], MD

## 2016-12-22 NOTE — Telephone Encounter (Signed)
Request for surgical clearance:  1. What type of surgery is being performed? Rt Olecarnon,Open Reduction,Internal Fixationernal   2. When is this surgery scheduled?01-01-17   3. Are there any medications that need to be held prior to surgery and how long?Can pt stop Eliquis 2 days prior to surgery and start back a day after surgery   4. Name of physician performing surgery? ?  5. What is your office phone and fax number? [phone] and fax is [phone]

## 2016-12-22 NOTE — Telephone Encounter (Signed)
Request for surgical clearance:  1. What type of surgery is being performed? Rt Olecarnon,Open Reduction,Internal Fixation   2. When is this surgery scheduled? 01-01-17   3. Are there any medications that need to be held prior to surgery and how long?Can pt stop Eliquis 2 days prior to surgery and start back on Eliquis next day after surgery?  4. Name of physician performing surgery? ?   5. What is your office phone and fax number? [phone] and fax is [phone]

## 2016-12-22 NOTE — Telephone Encounter (Signed)
close

## 2016-12-22 NOTE — Telephone Encounter (Signed)
Fax number is [phone].

## 2016-12-22 NOTE — Telephone Encounter (Signed)
Close this encounter,computer when out and note appeared as incomplete

## 2016-12-23 NOTE — Telephone Encounter (Signed)
Routed information to [facility]

## 2016-12-24 DIAGNOSIS — S52021A Displaced fracture of olecranon process without intraarticular extension of right ulna, initial encounter for closed fracture: Secondary | ICD-10-CM | POA: Diagnosis not present

## 2016-12-24 DIAGNOSIS — S51011A Laceration without foreign body of right elbow, initial encounter: Secondary | ICD-10-CM | POA: Diagnosis not present

## 2016-12-26 ENCOUNTER — Encounter
Admission: RE | Admit: 2016-12-26 | Discharge: 2016-12-26 | Disposition: A | Discharge status: Home / Self Care | Payer: Medicare Other | Source: Ambulatory Visit | Attending: Orthopedic Surgery | Admitting: Orthopedic Surgery

## 2016-12-26 DIAGNOSIS — I10 Essential (primary) hypertension: Secondary | ICD-10-CM | POA: Diagnosis not present

## 2016-12-26 DIAGNOSIS — Z01810 Encounter for preprocedural cardiovascular examination: Secondary | ICD-10-CM | POA: Insufficient documentation

## 2016-12-26 DIAGNOSIS — Z01812 Encounter for preprocedural laboratory examination: Secondary | ICD-10-CM | POA: Insufficient documentation

## 2016-12-26 LAB — BASIC METABOLIC PANEL
Anion gap: 9 (ref 5–15)
BUN: 13 mg/dL (ref 6–20)
CO2: 25 mmol/L (ref 22–32)
Calcium: 9.1 mg/dL (ref 8.9–10.3)
Chloride: 105 mmol/L (ref 101–111)
Creatinine, Ser: 0.91 mg/dL (ref 0.61–1.24)
GFR calc Af Amer: >60 mL/min (ref >60)
GFR calc non Af Amer: >60 mL/min (ref >60)
Glucose, Bld: 101 mg/dL — ABNORMAL HIGH (ref 65–99)
Potassium: 3.8 mmol/L (ref 3.5–5.1)
Sodium: 139 mmol/L (ref 135–145)

## 2016-12-26 LAB — CBC
HCT: 37.5 % — ABNORMAL LOW (ref 40.0–52.0)
Hemoglobin: 12.9 g/dL — ABNORMAL LOW (ref 13.0–18.0)
MCH: 31.4 pg (ref 26.0–34.0)
MCHC: 34.5 g/dL (ref 32.0–36.0)
MCV: 91.2 fL (ref 80.0–100.0)
Platelets: 263 10*3/uL (ref 150–440)
RBC: 4.12 MIL/uL — ABNORMAL LOW (ref 4.40–5.90)
RDW: 14.6 % — ABNORMAL HIGH (ref 11.5–14.5)
WBC: 10.9 10*3/uL — ABNORMAL HIGH (ref 3.8–10.6)

## 2016-12-26 NOTE — Patient Instructions (Signed)
  Your procedure is scheduled JF:[id number]. 01/01/17 Report to Day Surgery. To find out your arrival time please call [phone] between 1PM - 3PM on Wed. 12/31/16.  Remember: Instructions that are not followed completely may result in serious medical risk, up to and including death, or upon the discretion of your surgeon and anesthesiologist your surgery may need to be rescheduled.    _x__ 1. Do not eat food or drink liquids after midnight. No gum chewing or hard candies.     _x___ 2. No Alcohol for 24 hours before or after surgery.   ____ 3. Do Not Smoke For 24 Hours Prior to Your Surgery.   ____ 4. Bring all medications with you on the day of surgery if instructed.    __x__ 5. Notify your doctor if there is any change in your medical condition     (cold, fever, infections).       Do not wear jewelry, make-up, hairpins, clips or nail polish.  Do not wear lotions, powders, or perfumes. You may wear deodorant.  Do not shave 48 hours prior to surgery. Men may shave face and neck.  Do not bring valuables to the hospital.    [facility] is not responsible for any belongings or valuables.               Contacts, dentures or bridgework may not be worn into surgery.  Leave your suitcase in the car. After surgery it may be brought to your room.  For patients admitted to the hospital, discharge time is determined by your                treatment team.   Patients discharged the day of surgery will not be allowed to drive home.   Please read over the following fact sheets that you were given:      _x___ Take these medicines the morning of surgery with A SIP OF WATER:    1.acetaminophen (TYLENOL) 500 MG tablet   2. dexlansoprazole (DEXILANT) 60 MG capsule night before and morning of surgery  3. lisinopril (PRINIVIL,ZESTRIL) 2.5 MG tablet  4.metoprolol tartrate (LOPRESSOR) 25 MG tablet  5.  6.  ____ Fleet Enema (as directed)   __x__ Use CHG Soap as directed  _x___ Use inhalers on the  day of surgeryFluticasone-Salmeterol (ADVAIR DISKUS) 250-50 MCG/DOSE AEPB  ____ Stop metformin 2 days prior to surgery    ____ Take 1/2 of usual insulin dose the night before surgery and none on the morning of surgery.   _x___ Stop  apixaban (ELIQUIS) 5 MG TABS tablet Last dose on Sunday  ____ Stop Anti-inflammatories on    ____ Stop supplements until after surgery.    _x___ Bring C-Pap to the hospital.

## 2016-12-30 DIAGNOSIS — S52021A Displaced fracture of olecranon process without intraarticular extension of right ulna, initial encounter for closed fracture: Secondary | ICD-10-CM | POA: Diagnosis not present

## 2016-12-30 DIAGNOSIS — S51011A Laceration without foreign body of right elbow, initial encounter: Secondary | ICD-10-CM | POA: Diagnosis not present

## 2017-01-01 ENCOUNTER — Ambulatory Visit
Admission: RE | Admit: 2017-01-01 | Discharge: 2017-01-01 | Disposition: A | Discharge status: Home / Self Care | Payer: Medicare Other | Source: Ambulatory Visit | Attending: Orthopedic Surgery | Admitting: Orthopedic Surgery

## 2017-01-01 ENCOUNTER — Encounter: Payer: Self-pay | Admitting: *Deleted

## 2017-01-01 ENCOUNTER — Encounter
Admission: RE | Disposition: A | Discharge status: Home / Self Care | Payer: Self-pay | Source: Ambulatory Visit | Attending: Orthopedic Surgery

## 2017-01-01 ENCOUNTER — Ambulatory Visit: Payer: Medicare Other | Admitting: Anesthesiology

## 2017-01-01 DIAGNOSIS — E039 Hypothyroidism, unspecified: Secondary | ICD-10-CM | POA: Diagnosis not present

## 2017-01-01 DIAGNOSIS — Z888 Allergy status to other drugs, medicaments and biological substances status: Secondary | ICD-10-CM | POA: Diagnosis not present

## 2017-01-01 DIAGNOSIS — Z79899 Other long term (current) drug therapy: Secondary | ICD-10-CM | POA: Diagnosis not present

## 2017-01-01 DIAGNOSIS — Z87891 Personal history of nicotine dependence: Secondary | ICD-10-CM | POA: Diagnosis not present

## 2017-01-01 DIAGNOSIS — I739 Peripheral vascular disease, unspecified: Secondary | ICD-10-CM | POA: Insufficient documentation

## 2017-01-01 DIAGNOSIS — I4891 Unspecified atrial fibrillation: Secondary | ICD-10-CM | POA: Insufficient documentation

## 2017-01-01 DIAGNOSIS — W19XXXA Unspecified fall, initial encounter: Secondary | ICD-10-CM | POA: Diagnosis not present

## 2017-01-01 DIAGNOSIS — S52021A Displaced fracture of olecranon process without intraarticular extension of right ulna, initial encounter for closed fracture: Secondary | ICD-10-CM | POA: Diagnosis not present

## 2017-01-01 DIAGNOSIS — G4730 Sleep apnea, unspecified: Secondary | ICD-10-CM | POA: Diagnosis not present

## 2017-01-01 DIAGNOSIS — Z951 Presence of aortocoronary bypass graft: Secondary | ICD-10-CM | POA: Diagnosis not present

## 2017-01-01 DIAGNOSIS — J449 Chronic obstructive pulmonary disease, unspecified: Secondary | ICD-10-CM | POA: Insufficient documentation

## 2017-01-01 DIAGNOSIS — I10 Essential (primary) hypertension: Secondary | ICD-10-CM | POA: Diagnosis not present

## 2017-01-01 DIAGNOSIS — Z820 Family history of epilepsy and other diseases of the nervous system: Secondary | ICD-10-CM | POA: Insufficient documentation

## 2017-01-01 DIAGNOSIS — Z7901 Long term (current) use of anticoagulants: Secondary | ICD-10-CM | POA: Diagnosis not present

## 2017-01-01 DIAGNOSIS — I2510 Atherosclerotic heart disease of native coronary artery without angina pectoris: Secondary | ICD-10-CM | POA: Insufficient documentation

## 2017-01-01 DIAGNOSIS — Z7951 Long term (current) use of inhaled steroids: Secondary | ICD-10-CM | POA: Insufficient documentation

## 2017-01-01 DIAGNOSIS — M1990 Unspecified osteoarthritis, unspecified site: Secondary | ICD-10-CM | POA: Insufficient documentation

## 2017-01-01 DIAGNOSIS — F419 Anxiety disorder, unspecified: Secondary | ICD-10-CM | POA: Diagnosis not present

## 2017-01-01 DIAGNOSIS — Z885 Allergy status to narcotic agent status: Secondary | ICD-10-CM | POA: Diagnosis not present

## 2017-01-01 HISTORY — PX: ORIF ELBOW FRACTURE: SHX5031

## 2017-01-01 SURGERY — OPEN REDUCTION INTERNAL FIXATION (ORIF) ELBOW/OLECRANON FRACTURE
Anesthesia: General | Site: Elbow | Laterality: Right | Wound class: Clean

## 2017-01-01 MED ORDER — CEFAZOLIN SODIUM-DEXTROSE 2-4 GM/100ML-% IV SOLN
INTRAVENOUS | Status: AC
  Filled 2017-01-01: qty 100

## 2017-01-01 MED ORDER — KETOROLAC TROMETHAMINE 30 MG/ML IJ SOLN
INTRAMUSCULAR | Status: AC
  Filled 2017-01-01: qty 1

## 2017-01-01 MED ORDER — TRAMADOL HCL 50 MG PO TABS
50.0000 mg | ORAL_TABLET | Freq: Once | ORAL | Status: AC
  Administered 2017-01-01: 50 mg via ORAL

## 2017-01-01 MED ORDER — SODIUM CHLORIDE 0.9 % IV SOLN: INTRAVENOUS | Status: DC

## 2017-01-01 MED ORDER — KETOROLAC TROMETHAMINE 30 MG/ML IJ SOLN
INTRAMUSCULAR | Status: DC | PRN
  Administered 2017-01-01: 30 mg via INTRAVENOUS

## 2017-01-01 MED ORDER — LACTATED RINGERS IV SOLN
INTRAVENOUS | Status: DC
  Administered 2017-01-01 (×2): via INTRAVENOUS

## 2017-01-01 MED ORDER — METOCLOPRAMIDE HCL 10 MG PO TABS: 5.0000 mg | ORAL_TABLET | Freq: Three times a day (TID) | ORAL | Status: DC | PRN

## 2017-01-01 MED ORDER — ONDANSETRON HCL 4 MG/2ML IJ SOLN
INTRAMUSCULAR | Status: AC
  Filled 2017-01-01: qty 2

## 2017-01-01 MED ORDER — ACETAMINOPHEN 10 MG/ML IV SOLN
INTRAVENOUS | Status: DC | PRN
  Administered 2017-01-01: 1000 mg via INTRAVENOUS

## 2017-01-01 MED ORDER — SUCCINYLCHOLINE CHLORIDE 20 MG/ML IJ SOLN
INTRAMUSCULAR | Status: AC
  Filled 2017-01-01: qty 1

## 2017-01-01 MED ORDER — ONDANSETRON HCL 4 MG PO TABS: 4.0000 mg | ORAL_TABLET | Freq: Four times a day (QID) | ORAL | Status: DC | PRN

## 2017-01-01 MED ORDER — TRAMADOL HCL 50 MG PO TABS
ORAL_TABLET | ORAL | Status: AC
  Filled 2017-01-01: qty 1

## 2017-01-01 MED ORDER — CEFAZOLIN SODIUM-DEXTROSE 2-4 GM/100ML-% IV SOLN
2.0000 g | Freq: Once | INTRAVENOUS | Status: AC
  Administered 2017-01-01: 2 g via INTRAVENOUS

## 2017-01-01 MED ORDER — ACETAMINOPHEN 10 MG/ML IV SOLN
INTRAVENOUS | Status: AC
  Filled 2017-01-01: qty 100

## 2017-01-01 MED ORDER — DEXAMETHASONE SODIUM PHOSPHATE 10 MG/ML IJ SOLN
INTRAMUSCULAR | Status: AC
  Filled 2017-01-01: qty 1

## 2017-01-01 MED ORDER — ONDANSETRON HCL 4 MG/2ML IJ SOLN
INTRAMUSCULAR | Status: DC | PRN
  Administered 2017-01-01: 4 mg via INTRAVENOUS

## 2017-01-01 MED ORDER — GLYCOPYRROLATE 0.2 MG/ML IJ SOLN
INTRAMUSCULAR | Status: AC
  Filled 2017-01-01: qty 1

## 2017-01-01 MED ORDER — FENTANYL CITRATE (PF) 100 MCG/2ML IJ SOLN
25.0000 ug | INTRAMUSCULAR | Status: AC | PRN
  Administered 2017-01-01 (×6): 25 ug via INTRAVENOUS

## 2017-01-01 MED ORDER — NEOMYCIN-POLYMYXIN B GU 40-200000 IR SOLN
Status: AC
  Filled 2017-01-01: qty 2

## 2017-01-01 MED ORDER — FENTANYL CITRATE (PF) 100 MCG/2ML IJ SOLN
INTRAMUSCULAR | Status: AC
  Administered 2017-01-01: 25 ug via INTRAVENOUS
  Filled 2017-01-01: qty 2

## 2017-01-01 MED ORDER — METOCLOPRAMIDE HCL 5 MG/ML IJ SOLN: 5.0000 mg | Freq: Three times a day (TID) | INTRAMUSCULAR | Status: DC | PRN

## 2017-01-01 MED ORDER — FENTANYL CITRATE (PF) 100 MCG/2ML IJ SOLN
INTRAMUSCULAR | Status: AC
  Filled 2017-01-01: qty 2

## 2017-01-01 MED ORDER — ONDANSETRON HCL 4 MG/2ML IJ SOLN: 4.0000 mg | Freq: Once | INTRAMUSCULAR | Status: DC | PRN

## 2017-01-01 MED ORDER — PHENYLEPHRINE HCL 10 MG/ML IJ SOLN
INTRAMUSCULAR | Status: DC | PRN
  Administered 2017-01-01 (×5): 100 ug via INTRAVENOUS

## 2017-01-01 MED ORDER — SUCCINYLCHOLINE CHLORIDE 20 MG/ML IJ SOLN
INTRAMUSCULAR | Status: DC | PRN
  Administered 2017-01-01: 100 mg via INTRAVENOUS

## 2017-01-01 MED ORDER — BUPIVACAINE HCL (PF) 0.5 % IJ SOLN
INTRAMUSCULAR | Status: AC
  Filled 2017-01-01: qty 30

## 2017-01-01 MED ORDER — PROPOFOL 10 MG/ML IV BOLUS
INTRAVENOUS | Status: AC
  Filled 2017-01-01: qty 20

## 2017-01-01 MED ORDER — NEOMYCIN-POLYMYXIN B GU 40-200000 IR SOLN
Status: DC | PRN
  Administered 2017-01-01: 2 mL

## 2017-01-01 MED ORDER — SUGAMMADEX SODIUM 200 MG/2ML IV SOLN
INTRAVENOUS | Status: AC
  Filled 2017-01-01: qty 2

## 2017-01-01 MED ORDER — ROCURONIUM BROMIDE 100 MG/10ML IV SOLN
INTRAVENOUS | Status: DC | PRN
  Administered 2017-01-01: 10 mg via INTRAVENOUS
  Administered 2017-01-01: 40 mg via INTRAVENOUS

## 2017-01-01 MED ORDER — BUPIVACAINE HCL (PF) 0.5 % IJ SOLN
INTRAMUSCULAR | Status: DC | PRN
  Administered 2017-01-01: 30 mL

## 2017-01-01 MED ORDER — ONDANSETRON HCL 4 MG/2ML IJ SOLN: 4.0000 mg | Freq: Four times a day (QID) | INTRAMUSCULAR | Status: DC | PRN

## 2017-01-01 MED ORDER — LIDOCAINE HCL (CARDIAC) 20 MG/ML IV SOLN
INTRAVENOUS | Status: DC | PRN
  Administered 2017-01-01: 100 mg via INTRAVENOUS

## 2017-01-01 MED ORDER — LIDOCAINE HCL (PF) 2 % IJ SOLN
INTRAMUSCULAR | Status: AC
  Filled 2017-01-01: qty 2

## 2017-01-01 MED ORDER — SUGAMMADEX SODIUM 500 MG/5ML IV SOLN
INTRAVENOUS | Status: DC | PRN
  Administered 2017-01-01: 198.6 mg via INTRAVENOUS

## 2017-01-01 MED ORDER — TRAMADOL HCL 50 MG PO TABS: 100.0000 mg | ORAL_TABLET | Freq: Four times a day (QID) | ORAL | Status: DC | PRN

## 2017-01-01 MED ORDER — DEXAMETHASONE SODIUM PHOSPHATE 10 MG/ML IJ SOLN
INTRAMUSCULAR | Status: DC | PRN
  Administered 2017-01-01: 10 mg via INTRAVENOUS

## 2017-01-01 MED ORDER — ROCURONIUM BROMIDE 100 MG/10ML IV SOLN
INTRAVENOUS | Status: AC
  Filled 2017-01-01: qty 1

## 2017-01-01 MED ORDER — PHENYLEPHRINE HCL 10 MG/ML IJ SOLN
INTRAMUSCULAR | Status: AC
  Filled 2017-01-01: qty 1

## 2017-01-01 MED ORDER — PROPOFOL 10 MG/ML IV BOLUS
INTRAVENOUS | Status: DC | PRN
  Administered 2017-01-01: 100 mg via INTRAVENOUS

## 2017-01-01 MED ORDER — TRAMADOL HCL 50 MG PO TABS: 50.0000 mg | ORAL_TABLET | Freq: Four times a day (QID) | ORAL | 1 refills | Status: DC | PRN

## 2017-01-01 MED ORDER — FENTANYL CITRATE (PF) 100 MCG/2ML IJ SOLN
INTRAMUSCULAR | Status: DC | PRN
  Administered 2017-01-01: 100 ug via INTRAVENOUS

## 2017-01-01 SURGICAL SUPPLY — 62 items
BANDAGE ELASTIC 4 LF NS (GAUZE/BANDAGES/DRESSINGS) ×4 IMPLANT
BIT DRILL 2.5X2.75 QC CALB (BIT) ×1 IMPLANT
BIT DRILL CALIBRATED 2.7 (BIT) ×1 IMPLANT
BNDG CMPR MED 5X4 ELC HKLP NS (GAUZE/BANDAGES/DRESSINGS) ×2
BNDG COHESIVE 4X5 TAN STRL (GAUZE/BANDAGES/DRESSINGS) ×2 IMPLANT
BNDG ESMARK 4X12 TAN STRL LF (GAUZE/BANDAGES/DRESSINGS) ×2 IMPLANT
CANISTER SUCT 1200ML W/VALVE (MISCELLANEOUS) ×2 IMPLANT
CHLORAPREP W/TINT 26ML (MISCELLANEOUS) ×2 IMPLANT
CUFF TOURN 18 STER (MISCELLANEOUS) ×2 IMPLANT
CUFF TOURN 24 STER (MISCELLANEOUS) ×2 IMPLANT
DRAPE C-ARM XRAY 36X54 (DRAPES) ×2 IMPLANT
DRAPE SHEET LG 3/4 BI-LAMINATE (DRAPES) ×2 IMPLANT
ELECT CAUTERY BLADE 6.4 (BLADE) ×2 IMPLANT
ELECT REM PT RETURN 9FT ADLT (ELECTROSURGICAL) ×2
ELECTRODE REM PT RTRN 9FT ADLT (ELECTROSURGICAL) ×1 IMPLANT
GAUZE PETRO XEROFOAM 1X8 (MISCELLANEOUS) ×2 IMPLANT
GAUZE SPONGE 4X4 12PLY STRL (GAUZE/BANDAGES/DRESSINGS) ×2 IMPLANT
GLOVE BIOGEL PI IND STRL 9 (GLOVE) ×1 IMPLANT
GLOVE BIOGEL PI INDICATOR 9 (GLOVE) ×1
GLOVE SURG SYN 9.0  PF PI (GLOVE) ×1
GLOVE SURG SYN 9.0 PF PI (GLOVE) ×1 IMPLANT
GOWN SRG 2XL LVL 4 RGLN SLV (GOWNS) ×1 IMPLANT
GOWN STRL NON-REIN 2XL LVL4 (GOWNS) ×2
GOWN STRL REUS W/ TWL LRG LVL3 (GOWN DISPOSABLE) ×1 IMPLANT
GOWN STRL REUS W/TWL LRG LVL3 (GOWN DISPOSABLE) ×2
K-WIRE ACE 1.6X6 (WIRE) ×2
KIT RM TURNOVER STRD PROC AR (KITS) ×2 IMPLANT
KWIRE ACE 1.6X6 (WIRE) IMPLANT
NDL FILTER BLUNT 18X1 1/2 (NEEDLE) ×1 IMPLANT
NEEDLE FILTER BLUNT 18X 1/2SAF (NEEDLE) ×1
NEEDLE FILTER BLUNT 18X1 1/2 (NEEDLE) ×1 IMPLANT
NS IRRIG 500ML POUR BTL (IV SOLUTION) ×2 IMPLANT
PACK EXTREMITY ARMC (MISCELLANEOUS) ×2 IMPLANT
PAD ABD DERMACEA PRESS 5X9 (GAUZE/BANDAGES/DRESSINGS) ×4 IMPLANT
PAD CAST CTTN 4X4 STRL (SOFTGOODS) ×3 IMPLANT
PAD PREP 24X41 OB/GYN DISP (PERSONAL CARE ITEMS) ×2 IMPLANT
PADDING CAST COTTON 4X4 STRL (SOFTGOODS) ×6
PLATE OLECRANON LONG (Plate) ×1 IMPLANT
SCREW CORT T15 24X3.5XST LCK (Screw) IMPLANT
SCREW CORT T15 28X3.5XST LCK (Screw) IMPLANT
SCREW CORTICAL 3.5X24MM (Screw) ×2 IMPLANT
SCREW CORTICAL 3.5X28MM (Screw) ×2 IMPLANT
SCREW LOCK 3.5X16 DIST TIB (Screw) ×2 IMPLANT
SCREW LOCK 3.5X20 DIST TIB (Screw) ×1 IMPLANT
SCREW LOCK CORT STAR 3.5X14 (Screw) ×1 IMPLANT
SCREW LOCK CORT STAR 3.5X16 (Screw) ×1 IMPLANT
SCREW LOCK CORT STAR 3.5X24 (Screw) ×1 IMPLANT
SCREW LOCK CORT STAR 3.5X30 (Screw) ×1 IMPLANT
SCREW LOCK CORT STAR 3.5X32 (Screw) ×1 IMPLANT
SCREW NLOCK CANC HEX 4X30 (Screw) ×2 IMPLANT
SCREW NLOCK T15 FT 30X4XST TIP (Screw) IMPLANT
SPLINT CAST 1 STEP 5X30 WHT (MISCELLANEOUS) ×2 IMPLANT
SPONGE LAP 18X18 5 PK (GAUZE/BANDAGES/DRESSINGS) ×2 IMPLANT
STAPLER SKIN PROX 35W (STAPLE) ×2 IMPLANT
STOCKINETTE IMPERVIOUS 9X36 MD (GAUZE/BANDAGES/DRESSINGS) ×2 IMPLANT
STRIP CLOSURE SKIN 1/2X4 (GAUZE/BANDAGES/DRESSINGS) ×2 IMPLANT
SUT ETHILON 3-0 FS-10 30 BLK (SUTURE) ×2
SUT VIC AB 0 CT2 27 (SUTURE) ×2 IMPLANT
SUT VIC AB 2-0 CT2 27 (SUTURE) ×2 IMPLANT
SUTURE EHLN 3-0 FS-10 30 BLK (SUTURE) ×1 IMPLANT
SYR 5ML LL (SYRINGE) ×2 IMPLANT
WASHER 3.5MM (Orthopedic Implant) ×1 IMPLANT

## 2017-01-01 NOTE — Op Note (Signed)
01/01/2017  4:15 PM  PATIENT:  [name]  83 y.o. male  PRE-OPERATIVE DIAGNOSIS:  olecranon fracture,closed initial encounter  POST-OPERATIVE DIAGNOSIS:  olecranon fracture,closed initial encounter  PROCEDURE:  Procedure(s): OPEN REDUCTION INTERNAL FIXATION (ORIF) ELBOW/OLECRANON FRACTURE (Right)  SURGEON: Laurene Footman, MD  ASSISTANTS: None  ANESTHESIA:   general  EBL:  Total I/O In: 600 [I.V.:600] Out: 10 [Blood:10]  BLOOD ADMINISTERED:none  DRAINS: none   LOCAL MEDICATIONS USED:  NONE  SPECIMEN:  No Specimen  DISPOSITION OF SPECIMEN:  N/A  COUNTS:  YES  TOURNIQUET:   55 minutes at 250 mm mercury  IMPLANTS: Biomet locking olecranon plate with multiple screws  DICTATION: .Dragon Dictation patient brought the operating room and after adequate anesthesia was obtained, the right arm was prepped and draped in sterile fashion. After patient identification and timeout procedures were completed tourniquet was raised and out posterior radial approach is made to the proximal ulna. Curvilinear incision being made around the olecranon the olecranon was exposed along with the proximal shaft of the ulna. There was extensive granulation tissue and the fracture site which was quite displaced by about 2 cm and this was debrided. After exposure of the proximal fragment a sutures placed through the tendon proximally and then left distally for subsequent passage through the plate through the most distal drill hole to reinforce the repair. The plate was applied to the proximal ulna and olecranon with a K wire being placed having been placed through the shaft of the ulna and into the proximal fragments. The suture was tightened and with the elbow in partial extension near anatomic reduction was obtained. There was a crush injury to the joint surface so as not a perfect reduction. Next proximal locking screws were placed followed by a nonlocking screw to try to suck the plate down to the  shaft which was not entirely successful. Locking screws were placed through the remaining holes of the distal portion of the plate with multiple unicortical screws placed giving was felt leg rigid fixation to the distal fragment. A longer screw was placed to the proximal plate through the tip of the plate and this gave rigid fixation to the construct and there is no motion at the fracture site through range of motion of the elbow. This felt this was adequate and acceptable fixation although the plate was not completely down onto the ulna and there with a crush injury to the central aspect of the olecranon the joint surface was not perfect but again deemed acceptable. The wound was irrigated this point and closed with 3-0 Vicryl subcutaneously followed by skin staples Xeroform 4 x 4's web roll and a splint followed by an Ace wrap  PLAN OF CARE: Discharge to home after PACU  PATIENT DISPOSITION:  PACU - hemodynamically stable.

## 2017-01-01 NOTE — H&P (Signed)
Reviewed paper H+P, will be scanned into chart. Patient examined No changes noted.  

## 2017-01-01 NOTE — Anesthesia Procedure Notes (Signed)
Procedure Name: Intubation Date/Time: 01/01/2017 3:07 PM Performed by: Nelda Marseille Pre-anesthesia Checklist: Patient identified, Patient being monitored, Timeout performed, Emergency Drugs available and Suction available Patient Re-evaluated:Patient Re-evaluated prior to inductionOxygen Delivery Method: Circle system utilized Preoxygenation: Pre-oxygenation with 100% oxygen Intubation Type: IV induction Ventilation: Mask ventilation without difficulty Laryngoscope Size: Mac and 3 Grade View: Grade II Tube type: Oral Tube size: 7.5 mm Number of attempts: 1 Airway Equipment and Method: Stylet Placement Confirmation: ETT inserted through vocal cords under direct vision,  positive ETCO2 and breath sounds checked- equal and bilateral Secured at: 21 cm Tube secured with: Tape Dental Injury: Teeth and Oropharynx as per pre-operative assessment

## 2017-01-01 NOTE — Discharge Instructions (Signed)
Keep arm elevated.       AMBULATORY SURGERY  DISCHARGE INSTRUCTIONS   1) The drugs that you were given will stay in your system until tomorrow so for the next 24 hours you should not:  A) Drive an automobile B) Make any legal decisions C) Drink any alcoholic beverage   2) You may resume regular meals tomorrow.  Today it is better to start with liquids and gradually work up to solid foods.  You may eat anything you prefer, but it is better to start with liquids, then soup and crackers, and gradually work up to solid foods.   3) Please notify your doctor immediately if you have any unusual bleeding, trouble breathing, redness and pain at the surgery site, drainage, fever, or pain not relieved by medication. 4)   5) Your post-operative visit with Dr.                                     is: Date:                        Time:    Please call to schedule your post-operative visit.  6) Additional Instructions:

## 2017-01-01 NOTE — Transfer of Care (Signed)
Immediate Anesthesia Transfer of Care Note  Patient: [name]  Procedure(s) Performed: Procedure(s): OPEN REDUCTION INTERNAL FIXATION (ORIF) ELBOW/OLECRANON FRACTURE (Right)  Patient Location: PACU  Anesthesia Type:General  Level of Consciousness: awake and sedated  Airway & Oxygen Therapy: Patient Spontanous Breathing and Patient connected to face mask oxygen  Post-op Assessment: Report given to RN and Post -op Vital signs reviewed and stable  Post vital signs: Reviewed and stable  Last Vitals: There were no vitals filed for this visit.  Last Pain: There were no vitals filed for this visit.       Complications: No apparent anesthesia complications

## 2017-01-01 NOTE — Anesthesia Postprocedure Evaluation (Signed)
Anesthesia Post Note  Patient: [name]  Procedure(s) Performed: Procedure(s) (LRB): OPEN REDUCTION INTERNAL FIXATION (ORIF) ELBOW/OLECRANON FRACTURE (Right)  Patient location during evaluation: PACU Anesthesia Type: General Level of consciousness: awake and alert and oriented Pain management: pain level controlled Vital Signs Assessment: post-procedure vital signs reviewed and stable Respiratory status: spontaneous breathing Cardiovascular status: blood pressure returned to baseline Anesthetic complications: no     Last Vitals:  Vitals:   01/01/17 1607  BP: (!) 149/82  Temp: (!) 36.1 C    Last Pain:  Vitals:   01/01/17 1607  PainSc: 0-No pain                 Kiwan Gadsden

## 2017-01-01 NOTE — Anesthesia Post-op Follow-up Note (Cosign Needed)
Anesthesia QCDR form completed.        

## 2017-01-01 NOTE — Anesthesia Preprocedure Evaluation (Signed)
Anesthesia Evaluation  Patient identified by MRN, date of birth, ID band Patient awake    Reviewed: Allergy & Precautions, NPO status , Patient's Chart, lab work & pertinent test results, reviewed documented beta blocker date and time   Airway Mallampati: III       Dental  (+) Upper Dentures, Lower Dentures   Pulmonary shortness of breath, sleep apnea, Continuous Positive Airway Pressure Ventilation and Oxygen sleep apnea , COPD, former smoker,    Pulmonary exam normal        Cardiovascular hypertension, Pt. on medications and Pt. on home beta blockers + CAD and + Peripheral Vascular Disease  Normal cardiovascular exam     Neuro/Psych PSYCHIATRIC DISORDERS Anxiety TIA Neuromuscular disease    GI/Hepatic GERD  ,  Endo/Other  Hypothyroidism   Renal/GU      Musculoskeletal  (+) Arthritis , Osteoarthritis,    Abdominal   Peds  Hematology   Anesthesia Other Findings Sleep apnea. Uses CPAP occasionally. Takes it off at night frequently. O2 at 2L at night only. RBBB for years. A fib during exercise, otherwise NSR. Echo few years ago OK.  Reproductive/Obstetrics                             Anesthesia Physical  Anesthesia Plan  ASA: III  Anesthesia Plan: General   Post-op Pain Management:    Induction: Intravenous  Airway Management Planned: Oral ETT  Additional Equipment:   Intra-op Plan:   Post-operative Plan: Extubation in OR  Informed Consent: I have reviewed the patients History and Physical, chart, labs and discussed the procedure including the risks, benefits and alternatives for the proposed anesthesia with the patient or authorized representative who has indicated his/her understanding and acceptance.     Plan Discussed with: CRNA and Surgeon  Anesthesia Plan Comments:         Anesthesia Quick Evaluation

## 2017-01-02 ENCOUNTER — Encounter: Payer: Self-pay | Admitting: Orthopedic Surgery

## 2017-01-12 ENCOUNTER — Telehealth: Payer: Self-pay | Admitting: Internal Medicine

## 2017-01-12 ENCOUNTER — Ambulatory Visit (INDEPENDENT_AMBULATORY_CARE_PROVIDER_SITE_OTHER): Payer: Medicare Other | Admitting: Family Medicine

## 2017-01-12 ENCOUNTER — Encounter: Payer: Self-pay | Admitting: Family Medicine

## 2017-01-12 VITALS — BP 118/52 | HR 74 | Temp 97.6°F | Resp 16 | Wt 223.0 lb

## 2017-01-12 DIAGNOSIS — J301 Allergic rhinitis due to pollen: Secondary | ICD-10-CM

## 2017-01-12 DIAGNOSIS — I779 Disorder of arteries and arterioles, unspecified: Secondary | ICD-10-CM | POA: Diagnosis not present

## 2017-01-12 MED ORDER — FLUTICASONE PROPIONATE 50 MCG/ACT NA SUSP: 2.0000 | Freq: Every day | NASAL | 6 refills | Status: DC

## 2017-01-12 MED ORDER — NITROGLYCERIN 0.4 MG SL SUBL: 0.4000 mg | SUBLINGUAL_TABLET | SUBLINGUAL | 1 refills | Status: DC | PRN

## 2017-01-12 MED ORDER — MONTELUKAST SODIUM 10 MG PO TABS: 10.0000 mg | ORAL_TABLET | Freq: Every day | ORAL | 3 refills | Status: DC

## 2017-01-12 NOTE — Progress Notes (Signed)
Patient: [name] Male    DOB: 1933/10/29   83 y.o.   MRN: [id number] Visit Date: 01/12/2017  Today's Provider: Lelon Huh, MD   Chief Complaint  Patient presents with  . Cough   Subjective:    Patient has been having to clear his throat every few minutes for the past 2 weeks. Patient states that he has mucous in his throat that he can not cough up. Patient has also been having snoring, and coughing while wearing his CPAP. Patient also has some wheezing at night. He has long history of OSA and has been on CPAP for years. States he just got new CPAP about 6 months ago and and it had been working well. Patient also a sore on his left arm that is bleeding. Sore came up 2 weeks ago. He has appointment scheduled with his dermatologist in a couple of weeks.    Cough  This is a new problem. The problem has been unchanged. The problem occurs every few minutes. The cough is non-productive. Associated symptoms include wheezing. Pertinent negatives include no chest pain, chills, ear congestion, ear pain, fever, headaches, heartburn, hemoptysis, myalgias, nasal congestion, postnasal drip, rash, rhinorrhea, sore throat, shortness of breath, sweats or weight loss. The symptoms are aggravated by lying down and exercise.      Allergies  Allergen Reactions  . Clonazepam Other (See Comments)    Altered mental status  . Codeine Other (See Comments)    Altered mental status.  . Niacin And Related Other (See Comments)    Flushing of skin  . Hydrocodone     confusion  . Oxycodone     confusion     Current Outpatient Prescriptions:  .  acetaminophen (TYLENOL) 500 MG tablet, Take 500-1,000 mg by mouth every 6 (six) hours as needed (for pain.)., Disp: , Rfl:  .  apixaban (ELIQUIS) 5 MG TABS tablet, Take 5 mg by mouth 2 (two) times daily. , Disp: , Rfl:  .  Ascorbic Acid (VITAMIN C) 1000 MG tablet, Take 1,000 mg by mouth daily., Disp: , Rfl:  .  Cholecalciferol (VITAMIN D-3) 5000  units TABS, Take 5,000 Units by mouth every evening., Disp: , Rfl:  .  Cyanocobalamin (VITAMIN B-12) 3000 MCG SUBL, Place 3,000 mcg under the tongue daily., Disp: , Rfl:  .  dexlansoprazole (DEXILANT) 60 MG capsule, Take 1 capsule (60 mg total) by mouth daily., Disp: 30 capsule, Rfl: 11 .  finasteride (PROSCAR) 5 MG tablet, Take 5 mg by mouth daily., Disp: , Rfl:  .  Fluticasone-Salmeterol (ADVAIR DISKUS) 250-50 MCG/DOSE AEPB, Inhale 1 puff into the lungs 2 (two) times daily., Disp: 3 each, Rfl: 3 .  ipratropium-albuterol (DUONEB) 0.5-2.5 (3) MG/3ML SOLN, Inhale 3 mLs into the lungs every 4 (four) hours as needed for shortness of breath or wheezing., Disp: , Rfl: 1 .  lisinopril (PRINIVIL,ZESTRIL) 2.5 MG tablet, TAKE ONE TABLET BY MOUTH TWICE DAILY, Disp: 180 tablet, Rfl: 3 .  magnesium oxide (MAG-OX) 400 MG tablet, Take 400 mg by mouth every evening. , Disp: , Rfl:  .  metoprolol tartrate (LOPRESSOR) 25 MG tablet, Take 1 tablet (25 mg total) by mouth 2 (two) times daily., Disp: 180 tablet, Rfl: 0 .  mirabegron ER (MYRBETRIQ) 25 MG TB24 tablet, Take 25 mg by mouth every evening., Disp: , Rfl:  .  Multiple Vitamins-Minerals (PRESERVISION AREDS 2 PO), Take 1 tablet by mouth 2 (two) times daily., Disp: , Rfl:  .  Multiple Vitamins-Minerals (PRESERVISION AREDS 2) CAPS, Take by mouth., Disp: , Rfl:  .  nitroGLYCERIN (NITROSTAT) 0.4 MG SL tablet, Place 1 tablet (0.4 mg total) under the tongue every 5 (five) minutes as needed for chest pain., Disp: 25 tablet, Rfl: 3 .  Polyethyl Glycol-Propyl Glycol (SYSTANE) 0.4-0.3 % SOLN, Place 1 drop into both eyes 3 (three) times daily as needed (FOR DRY EYES)., Disp: , Rfl:  .  rosuvastatin (CRESTOR) 20 MG tablet, TAKE ONE TABLET BY MOUTH EVERY DAY (Patient taking differently: TAKE ONE TABLET BY MOUTH EVERY DAY IN THE EVENING), Disp: 90 tablet, Rfl: 3 .  tamsulosin (FLOMAX) 0.4 MG CAPS capsule, Take 0.4 mg by mouth daily., Disp: , Rfl:  .  ZETIA 10 MG tablet, TAKE ONE  TABLET EVERY DAY (Patient taking differently: TAKE ONE TABLET EVERY DAY IN THE EVENING), Disp: 90 tablet, Rfl: 3  Review of Systems  Constitutional: Negative for chills, fever and weight loss.  HENT: Negative for ear pain, postnasal drip, rhinorrhea and sore throat.   Respiratory: Positive for cough and wheezing. Negative for hemoptysis and shortness of breath.   Cardiovascular: Negative for chest pain.  Gastrointestinal: Negative for heartburn.  Musculoskeletal: Negative for myalgias.  Skin: Negative for rash.  Neurological: Negative for headaches.    Social History  Substance Use Topics  . Smoking status: Former Smoker    Types: Cigarettes    Quit date: 08/04/1997  . Smokeless tobacco: Never Used  . Alcohol use No   Objective:   BP (!) 118/52 (BP Location: Right Arm, Patient Position: Sitting, Cuff Size: Large)   Pulse 74   Temp 97.6 F (36.4 C) (Oral)   Resp 16   Wt 223 lb (101.2 kg)   SpO2 98%   BMI 30.24 kg/m     Physical Exam  General Appearance:    Alert, cooperative, no distress  HENT:   bilateral TM normal without fluid or infectionnasal mucosa pale and congested  Eyes:    PERRL, conjunctiva/corneas clear, EOM's intact       Lungs:     Clear to auscultation bilaterally, respirations unlabored  Heart:    Regular rate and rhythm  Neurologic:   Awake, alert, oriented x 3. No apparent focal neurological           defect.           Assessment & Plan:     1. Allergic rhinitis due to pollen, unspecified seasonality Patient Instructions  Use Neti-pot before using fluticasone nasal spray at night   - montelukast (SINGULAIR) 10 MG tablet; Take 1 tablet (10 mg total) by mouth at bedtime.  Dispense: 30 tablet; Refill: 3 - fluticasone (FLONASE) 50 MCG/ACT nasal spray; Place 2 sprays into both nostrils daily.  Dispense: 16 g; Refill: 6     The entirety of the information documented in the History of Present Illness, Review of Systems and Physical Exam were  personally obtained by me. Portions of this information were initially documented by April M. Sabra Heck, CMA and reviewed by me for thoroughness and accuracy.    Lelon Huh, MD  [facility]

## 2017-01-12 NOTE — Telephone Encounter (Signed)
Pt friend, [name], states pt has been snoring, snorting ,talking, and incredibly restless.  he is using his CPAP, getting "good numbers". She states pt is dealing with a broken elbow. Please call.

## 2017-01-12 NOTE — Patient Instructions (Signed)
Use Neti-pot before using fluticasone nasal spray at night

## 2017-01-12 NOTE — Telephone Encounter (Signed)
R/T call to [name]. Patient has been scheduled at 1st available with DR.

## 2017-01-14 DIAGNOSIS — Z9181 History of falling: Secondary | ICD-10-CM | POA: Diagnosis not present

## 2017-01-14 DIAGNOSIS — J449 Chronic obstructive pulmonary disease, unspecified: Secondary | ICD-10-CM | POA: Diagnosis not present

## 2017-01-14 DIAGNOSIS — S52021D Displaced fracture of olecranon process without intraarticular extension of right ulna, subsequent encounter for closed fracture with routine healing: Secondary | ICD-10-CM | POA: Diagnosis not present

## 2017-01-14 DIAGNOSIS — I10 Essential (primary) hypertension: Secondary | ICD-10-CM | POA: Diagnosis not present

## 2017-01-14 DIAGNOSIS — G4733 Obstructive sleep apnea (adult) (pediatric): Secondary | ICD-10-CM | POA: Diagnosis not present

## 2017-01-14 DIAGNOSIS — I2510 Atherosclerotic heart disease of native coronary artery without angina pectoris: Secondary | ICD-10-CM | POA: Diagnosis not present

## 2017-01-14 DIAGNOSIS — I482 Chronic atrial fibrillation: Secondary | ICD-10-CM | POA: Diagnosis not present

## 2017-01-14 DIAGNOSIS — R262 Difficulty in walking, not elsewhere classified: Secondary | ICD-10-CM | POA: Diagnosis not present

## 2017-01-15 ENCOUNTER — Other Ambulatory Visit: Payer: Self-pay | Admitting: Family Medicine

## 2017-01-15 DIAGNOSIS — K219 Gastro-esophageal reflux disease without esophagitis: Secondary | ICD-10-CM

## 2017-01-15 MED ORDER — DEXLANSOPRAZOLE 60 MG PO CPDR: 1.0000 | DELAYED_RELEASE_CAPSULE | Freq: Every day | ORAL | 11 refills | Status: DC

## 2017-01-15 NOTE — Telephone Encounter (Signed)
Was refill 12/19/2016, but was sent to [facility]. Please review. Renaldo Fiddler, CMA

## 2017-01-15 NOTE — Telephone Encounter (Signed)
[facility] faxed a request on the following medication. Thanks CC  dexlansoprazole (DEXILANT) 60 MG capsule  Take 1 capsule every day.

## 2017-01-16 DIAGNOSIS — Z8781 Personal history of (healed) traumatic fracture: Secondary | ICD-10-CM | POA: Diagnosis not present

## 2017-01-16 DIAGNOSIS — Z967 Presence of other bone and tendon implants: Secondary | ICD-10-CM | POA: Diagnosis not present

## 2017-01-19 DIAGNOSIS — R262 Difficulty in walking, not elsewhere classified: Secondary | ICD-10-CM | POA: Diagnosis not present

## 2017-01-19 DIAGNOSIS — J449 Chronic obstructive pulmonary disease, unspecified: Secondary | ICD-10-CM | POA: Diagnosis not present

## 2017-01-19 DIAGNOSIS — I10 Essential (primary) hypertension: Secondary | ICD-10-CM | POA: Diagnosis not present

## 2017-01-19 DIAGNOSIS — I482 Chronic atrial fibrillation: Secondary | ICD-10-CM | POA: Diagnosis not present

## 2017-01-19 DIAGNOSIS — S52021D Displaced fracture of olecranon process without intraarticular extension of right ulna, subsequent encounter for closed fracture with routine healing: Secondary | ICD-10-CM | POA: Diagnosis not present

## 2017-01-19 DIAGNOSIS — I2510 Atherosclerotic heart disease of native coronary artery without angina pectoris: Secondary | ICD-10-CM | POA: Diagnosis not present

## 2017-01-22 DIAGNOSIS — I2510 Atherosclerotic heart disease of native coronary artery without angina pectoris: Secondary | ICD-10-CM | POA: Diagnosis not present

## 2017-01-22 DIAGNOSIS — S52021D Displaced fracture of olecranon process without intraarticular extension of right ulna, subsequent encounter for closed fracture with routine healing: Secondary | ICD-10-CM | POA: Diagnosis not present

## 2017-01-22 DIAGNOSIS — R262 Difficulty in walking, not elsewhere classified: Secondary | ICD-10-CM | POA: Diagnosis not present

## 2017-01-22 DIAGNOSIS — I482 Chronic atrial fibrillation: Secondary | ICD-10-CM | POA: Diagnosis not present

## 2017-01-22 DIAGNOSIS — I10 Essential (primary) hypertension: Secondary | ICD-10-CM | POA: Diagnosis not present

## 2017-01-22 DIAGNOSIS — J449 Chronic obstructive pulmonary disease, unspecified: Secondary | ICD-10-CM | POA: Diagnosis not present

## 2017-01-23 DIAGNOSIS — R262 Difficulty in walking, not elsewhere classified: Secondary | ICD-10-CM | POA: Diagnosis not present

## 2017-01-23 DIAGNOSIS — I482 Chronic atrial fibrillation: Secondary | ICD-10-CM | POA: Diagnosis not present

## 2017-01-23 DIAGNOSIS — J449 Chronic obstructive pulmonary disease, unspecified: Secondary | ICD-10-CM | POA: Diagnosis not present

## 2017-01-23 DIAGNOSIS — I10 Essential (primary) hypertension: Secondary | ICD-10-CM | POA: Diagnosis not present

## 2017-01-23 DIAGNOSIS — S52021D Displaced fracture of olecranon process without intraarticular extension of right ulna, subsequent encounter for closed fracture with routine healing: Secondary | ICD-10-CM | POA: Diagnosis not present

## 2017-01-23 DIAGNOSIS — I2510 Atherosclerotic heart disease of native coronary artery without angina pectoris: Secondary | ICD-10-CM | POA: Diagnosis not present

## 2017-01-26 DIAGNOSIS — R262 Difficulty in walking, not elsewhere classified: Secondary | ICD-10-CM | POA: Diagnosis not present

## 2017-01-26 DIAGNOSIS — I482 Chronic atrial fibrillation: Secondary | ICD-10-CM | POA: Diagnosis not present

## 2017-01-26 DIAGNOSIS — I10 Essential (primary) hypertension: Secondary | ICD-10-CM | POA: Diagnosis not present

## 2017-01-26 DIAGNOSIS — I2510 Atherosclerotic heart disease of native coronary artery without angina pectoris: Secondary | ICD-10-CM | POA: Diagnosis not present

## 2017-01-26 DIAGNOSIS — S52021D Displaced fracture of olecranon process without intraarticular extension of right ulna, subsequent encounter for closed fracture with routine healing: Secondary | ICD-10-CM | POA: Diagnosis not present

## 2017-01-26 DIAGNOSIS — J449 Chronic obstructive pulmonary disease, unspecified: Secondary | ICD-10-CM | POA: Diagnosis not present

## 2017-01-29 DIAGNOSIS — C44629 Squamous cell carcinoma of skin of left upper limb, including shoulder: Secondary | ICD-10-CM | POA: Diagnosis not present

## 2017-01-29 DIAGNOSIS — L570 Actinic keratosis: Secondary | ICD-10-CM | POA: Diagnosis not present

## 2017-01-29 DIAGNOSIS — I10 Essential (primary) hypertension: Secondary | ICD-10-CM | POA: Diagnosis not present

## 2017-01-29 DIAGNOSIS — Z85828 Personal history of other malignant neoplasm of skin: Secondary | ICD-10-CM | POA: Diagnosis not present

## 2017-01-29 DIAGNOSIS — S52021D Displaced fracture of olecranon process without intraarticular extension of right ulna, subsequent encounter for closed fracture with routine healing: Secondary | ICD-10-CM | POA: Diagnosis not present

## 2017-01-29 DIAGNOSIS — D692 Other nonthrombocytopenic purpura: Secondary | ICD-10-CM | POA: Diagnosis not present

## 2017-01-29 DIAGNOSIS — Z08 Encounter for follow-up examination after completed treatment for malignant neoplasm: Secondary | ICD-10-CM | POA: Diagnosis not present

## 2017-01-29 DIAGNOSIS — I2510 Atherosclerotic heart disease of native coronary artery without angina pectoris: Secondary | ICD-10-CM | POA: Diagnosis not present

## 2017-01-29 DIAGNOSIS — I482 Chronic atrial fibrillation: Secondary | ICD-10-CM | POA: Diagnosis not present

## 2017-01-29 DIAGNOSIS — X32XXXA Exposure to sunlight, initial encounter: Secondary | ICD-10-CM | POA: Diagnosis not present

## 2017-01-29 DIAGNOSIS — R58 Hemorrhage, not elsewhere classified: Secondary | ICD-10-CM | POA: Diagnosis not present

## 2017-01-29 DIAGNOSIS — R262 Difficulty in walking, not elsewhere classified: Secondary | ICD-10-CM | POA: Diagnosis not present

## 2017-01-29 DIAGNOSIS — L720 Epidermal cyst: Secondary | ICD-10-CM | POA: Diagnosis not present

## 2017-01-29 DIAGNOSIS — D485 Neoplasm of uncertain behavior of skin: Secondary | ICD-10-CM | POA: Diagnosis not present

## 2017-01-29 DIAGNOSIS — J449 Chronic obstructive pulmonary disease, unspecified: Secondary | ICD-10-CM | POA: Diagnosis not present

## 2017-02-03 DIAGNOSIS — S52021D Displaced fracture of olecranon process without intraarticular extension of right ulna, subsequent encounter for closed fracture with routine healing: Secondary | ICD-10-CM | POA: Diagnosis not present

## 2017-02-03 DIAGNOSIS — R262 Difficulty in walking, not elsewhere classified: Secondary | ICD-10-CM | POA: Diagnosis not present

## 2017-02-03 DIAGNOSIS — I482 Chronic atrial fibrillation: Secondary | ICD-10-CM | POA: Diagnosis not present

## 2017-02-03 DIAGNOSIS — I2510 Atherosclerotic heart disease of native coronary artery without angina pectoris: Secondary | ICD-10-CM | POA: Diagnosis not present

## 2017-02-03 DIAGNOSIS — I10 Essential (primary) hypertension: Secondary | ICD-10-CM | POA: Diagnosis not present

## 2017-02-03 DIAGNOSIS — J449 Chronic obstructive pulmonary disease, unspecified: Secondary | ICD-10-CM | POA: Diagnosis not present

## 2017-02-05 DIAGNOSIS — I482 Chronic atrial fibrillation: Secondary | ICD-10-CM | POA: Diagnosis not present

## 2017-02-05 DIAGNOSIS — S52021D Displaced fracture of olecranon process without intraarticular extension of right ulna, subsequent encounter for closed fracture with routine healing: Secondary | ICD-10-CM | POA: Diagnosis not present

## 2017-02-05 DIAGNOSIS — I2510 Atherosclerotic heart disease of native coronary artery without angina pectoris: Secondary | ICD-10-CM | POA: Diagnosis not present

## 2017-02-05 DIAGNOSIS — I10 Essential (primary) hypertension: Secondary | ICD-10-CM | POA: Diagnosis not present

## 2017-02-05 DIAGNOSIS — R262 Difficulty in walking, not elsewhere classified: Secondary | ICD-10-CM | POA: Diagnosis not present

## 2017-02-05 DIAGNOSIS — J449 Chronic obstructive pulmonary disease, unspecified: Secondary | ICD-10-CM | POA: Diagnosis not present

## 2017-02-10 DIAGNOSIS — S52021D Displaced fracture of olecranon process without intraarticular extension of right ulna, subsequent encounter for closed fracture with routine healing: Secondary | ICD-10-CM | POA: Diagnosis not present

## 2017-02-10 DIAGNOSIS — I2510 Atherosclerotic heart disease of native coronary artery without angina pectoris: Secondary | ICD-10-CM | POA: Diagnosis not present

## 2017-02-10 DIAGNOSIS — R262 Difficulty in walking, not elsewhere classified: Secondary | ICD-10-CM | POA: Diagnosis not present

## 2017-02-10 DIAGNOSIS — J449 Chronic obstructive pulmonary disease, unspecified: Secondary | ICD-10-CM | POA: Diagnosis not present

## 2017-02-10 DIAGNOSIS — I482 Chronic atrial fibrillation: Secondary | ICD-10-CM | POA: Diagnosis not present

## 2017-02-10 DIAGNOSIS — I10 Essential (primary) hypertension: Secondary | ICD-10-CM | POA: Diagnosis not present

## 2017-02-12 ENCOUNTER — Other Ambulatory Visit: Payer: Self-pay | Admitting: Cardiology

## 2017-02-13 DIAGNOSIS — I2510 Atherosclerotic heart disease of native coronary artery without angina pectoris: Secondary | ICD-10-CM | POA: Diagnosis not present

## 2017-02-13 DIAGNOSIS — Z8781 Personal history of (healed) traumatic fracture: Secondary | ICD-10-CM | POA: Diagnosis not present

## 2017-02-13 DIAGNOSIS — J449 Chronic obstructive pulmonary disease, unspecified: Secondary | ICD-10-CM | POA: Diagnosis not present

## 2017-02-13 DIAGNOSIS — I482 Chronic atrial fibrillation: Secondary | ICD-10-CM | POA: Diagnosis not present

## 2017-02-13 DIAGNOSIS — R262 Difficulty in walking, not elsewhere classified: Secondary | ICD-10-CM | POA: Diagnosis not present

## 2017-02-13 DIAGNOSIS — I10 Essential (primary) hypertension: Secondary | ICD-10-CM | POA: Diagnosis not present

## 2017-02-13 DIAGNOSIS — S52021D Displaced fracture of olecranon process without intraarticular extension of right ulna, subsequent encounter for closed fracture with routine healing: Secondary | ICD-10-CM | POA: Diagnosis not present

## 2017-02-13 DIAGNOSIS — Z967 Presence of other bone and tendon implants: Secondary | ICD-10-CM | POA: Diagnosis not present

## 2017-02-15 NOTE — Progress Notes (Signed)
* [facility]     Assessment and Plan:  Severe Obstructive sleep apnea.  -Auto CPAP with pressure settings of 10-20 doing well, will continue.  -Residual AHI most recent download is 8.7, which is not ideal but adequate. Continue current settings. -Usage could be improved, average usage is 4 hours 55 minutes on days used.  Periodic Limb Movements in Sleep.  -Sleep study showed severe periodic limb movements, these persisted on the patient's titration study with an arousal index of 5. -ferritin level, 08/01/16; 46 (normal).   Insomnia. -improved, not on medication.   Interstitial lung disease. -Doubt NSIP, changes are likely related to underlying emphysema. No active treatment required at this time.  COPD/Emphysema.  -Significant emphysematous changes seen on CT chest. -Continue Advair. -The patient is encouraged to try to increase his physical activity level.  Lung nodule.  -Right upper lobe granuloma, calcified. No further follow-up needed.  Atrial fibrillation.  -Currently on Eliquis for atrial fibrillation, continue to follow with cardiology.   Weakness/deconditioning -As above secondary to multiple comorbidities, patient has to try to increase his physical activity level in a safe manner.  Date: 02/15/2017  MRN# [id number] [name] [birth year]   [name] is a 83 y.o. old male seen in follow up for chief complaint of  Chief Complaint  Patient presents with  . Sleep Apnea    Pt wears cpap     HPI:   The patient is an 83 yo male with a history OSA and COPD. He has been having trouble tolerating CPAP, he was sent for a mask fitting, he has been through several types of masks. It seems that his main problem is taking the mask off in the middle of the night but not know that he is doing it.  Since his last visit he fell and broke his right elbow, he is on eliquis and remains on it.   He feels that he has been doing well with his  machine, he occasionally takes it off at night without realizing it.   He has continued on advair twice daily, he feels that his breathing has been doing well with it.   **Review of download data for 1 month from 01/18/17-6/18: Uses greater than 4 hours is 60%. Average usage on days used is 4 hours 55 minutes. Pressure setting 10-20. Median pressure 10.5, 95th percentile pressure of 13.6, maximum 50.1. Residual AHI is 8.7, which are predominantly hypopneas. No Cheyne-Stokes noted.  **Review of his recent sleep titration study showed that his OSA was inadequately treated at the current level, and his PLMI and arousal index remained elevated at 5. At last visit his CPAP was changed to auto 10-20, he was asked to try to judiciously increase his activity as it was thought that he had some deconditioning, and he was asked to continue advair. He continues to use advair.   He is taking ambien every night.   He is doing well with the PAP, he uses it every night, and is not tired during the day and has been sleeping well at night.    Review and summary of previous testing: --PFT 11/29/14; FVC=93%; Fev1=103%; ratio=80%. RV=58%, TLC=76%, rv/tlc=71%; Diffusion=37%; appears consistent with restriction.  --The patient has a history of OSA and COPD. Review of sleep study on 05/14/11 showed AHI of 6.9 with RDI of 41. Sleep efficiency was 53% and latency was short at 8 min. PLMI was very elevated at 85 with an arousal index of 9.  --  Titration study on 12/12/14; sleep latency of 11 min; with sleep efficiency of 50%, AHI=40. Cpap titrated from 4-14; CPAP of 12 was recommended, however the patient continued to have elevated of 26 at pressure of 14.   Ct images reviewed; 12/15/15; 02/21/15--  There is persistent emphysematous changes, greatest in upper lobes. Stable RUL calcified granuloma. 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 Per report it is unchanged, but to my eye it appears  that it is advancing. Slight mediastinal lymphadenopathy, paratracheal areas.    Medication:   Outpatient Encounter Prescriptions as of 02/18/2017  Medication Sig  . acetaminophen (TYLENOL) 500 MG tablet Take 500-1,000 mg by mouth every 6 (six) hours as needed (for pain.).  [name] apixaban (ELIQUIS) 5 MG TABS tablet Take 5 mg by mouth 2 (two) times daily.   . Ascorbic Acid (VITAMIN C) 1000 MG tablet Take 1,000 mg by mouth daily.  . Cholecalciferol (VITAMIN D-3) 5000 units TABS Take 5,000 Units by mouth every evening.  . Cyanocobalamin (VITAMIN B-12) 3000 MCG SUBL Place 3,000 mcg under the tongue daily.  [name] dexlansoprazole (DEXILANT) 60 MG capsule Take 1 capsule (60 mg total) by mouth daily.  . finasteride (PROSCAR) 5 MG tablet Take 5 mg by mouth daily.  . fluticasone (FLONASE) 50 MCG/ACT nasal spray Place 2 sprays into both nostrils daily.  . Fluticasone-Salmeterol (ADVAIR DISKUS) 250-50 MCG/DOSE AEPB Inhale 1 puff into the lungs 2 (two) times daily.  [name] ipratropium-albuterol (DUONEB) 0.5-2.5 (3) MG/3ML SOLN Inhale 3 mLs into the lungs every 4 (four) hours as needed for shortness of breath or wheezing.  [name] lisinopril (PRINIVIL,ZESTRIL) 2.5 MG tablet TAKE ONE TABLET BY MOUTH TWICE DAILY  . magnesium oxide (MAG-OX) 400 MG tablet Take 400 mg by mouth every evening.   . metoprolol tartrate (LOPRESSOR) 25 MG tablet TAKE 1 TABLET BY MOUTH TWICE DAILY  . mirabegron ER (MYRBETRIQ) 25 MG TB24 tablet Take 25 mg by mouth every evening.  . montelukast (SINGULAIR) 10 MG tablet Take 1 tablet (10 mg total) by mouth at bedtime.  . Multiple Vitamins-Minerals (PRESERVISION AREDS 2 PO) Take 1 tablet by mouth 2 (two) times daily.  . Multiple Vitamins-Minerals (PRESERVISION AREDS 2) CAPS Take by mouth.  . nitroGLYCERIN (NITROSTAT) 0.4 MG SL tablet Place 1 tablet (0.4 mg total) under the tongue every 5 (five) minutes as needed for chest pain.  [name] Glycol-Propyl Glycol (SYSTANE) 0.4-0.3 % SOLN Place 1 drop into both  eyes 3 (three) times daily as needed (FOR DRY EYES).  . rosuvastatin (CRESTOR) 20 MG tablet TAKE ONE TABLET BY MOUTH EVERY DAY (Patient taking differently: TAKE ONE TABLET BY MOUTH EVERY DAY IN THE EVENING)  . tamsulosin (FLOMAX) 0.4 MG CAPS capsule Take 0.4 mg by mouth daily.  [name] ZETIA 10 MG tablet TAKE ONE TABLET EVERY DAY (Patient taking differently: TAKE ONE TABLET EVERY DAY IN THE EVENING)   No facility-administered encounter medications on file as of 02/18/2017.      Allergies:  Clonazepam; Codeine; Niacin and related; Hydrocodone; and Oxycodone  Review of Systems: Gen:  Denies  fever, sweats. HEENT: Denies blurred vision. Cvc:  No dizziness, chest pain or heaviness Resp:   Denies cough or sputum porduction. Gi: Denies swallowing difficulty, stomach pain. constipation, bowel incontinence Gu:  Denies bladder incontinence, burning urine Ext:   No Joint pain, stiffness. Skin: No skin rash, easy bruising. Endoc:  No polyuria, polydipsia. Psych: No depression, insomnia. Other:  All other systems were reviewed and found to be  negative other than what is mentioned in the HPI.   Physical Examination:   VS: BP 138/84 (BP Location: Right Arm, Cuff Size: Normal)   Pulse 72   Resp 16   Ht 6' (1.829 m)   Wt 98.9 kg (218 lb)   SpO2 94%   BMI 29.57 kg/m   General Appearance: No distress  Neuro:without focal findings,  speech normal,  HEENT: PERRLA, EOM intact. Pulmonary: normal breath sounds, No wheezing.   CardiovascularNormal S1,S2.  No m/r/g.   Abdomen: Benign, Soft, non-tender. Renal:  No costovertebral tenderness  GU:  Not performed at this time. Endoc: No evident thyromegaly, no signs of acromegaly. Skin:   warm, no rash. Extremities: normal, no cyanosis, clubbing.   LABORATORY PANEL:   CBC No results for input(s): WBC, HGB, HCT, PLT in the last 168  hours. ------------------------------------------------------------------------------------------------------------------  Chemistries  No results for input(s): NA, K, CL, CO2, GLUCOSE, BUN, CREATININE, CALCIUM, MG, AST, ALT, ALKPHOS, BILITOT in the last 168 hours.  Invalid input(s): GFRCGP ------------------------------------------------------------------------------------------------------------------  Cardiac Enzymes No results for input(s): TROPONINI in the last 168 hours. ------------------------------------------------------------  RADIOLOGY:   No results found for this or any previous visit. Results for orders placed during the hospital encounter of 06/02/16  DG Chest 2 View   Narrative CLINICAL DATA:  Fatigue and weakness.  EXAM: CHEST  2 VIEW  COMPARISON:  CT of the chest on 12/05/2015  FINDINGS: The heart size and mediastinal contours are within normal limits and stable post CABG. Stable calcified granuloma at the right lung apex. Stable bilateral parenchymal scarring. There is no evidence of pulmonary edema, consolidation, pneumothorax, nodule or pleural fluid. The visualized skeletal structures are unremarkable.  IMPRESSION: No active cardiopulmonary disease.   Electronically Signed   By: Aletta Edouard M.D.   On: 06/02/2016 16:19    ------------------------------------------------------------------------------------------------------------------  Thank  you for allowing [facility] to assist in the care of your patient. Our recommendations are noted above.  Please contact us if we can be of further service.   Marda Stalker, MD.  [facility] Office Number: [phone]  Patricia Pesa, M.D.  Vilinda Boehringer, M.D.  Merton Border, M.D  02/15/2017

## 2017-02-16 ENCOUNTER — Encounter: Payer: Self-pay | Admitting: Internal Medicine

## 2017-02-16 DIAGNOSIS — R410 Disorientation, unspecified: Secondary | ICD-10-CM | POA: Diagnosis not present

## 2017-02-16 DIAGNOSIS — S52021D Displaced fracture of olecranon process without intraarticular extension of right ulna, subsequent encounter for closed fracture with routine healing: Secondary | ICD-10-CM | POA: Diagnosis not present

## 2017-02-16 DIAGNOSIS — I482 Chronic atrial fibrillation: Secondary | ICD-10-CM | POA: Diagnosis not present

## 2017-02-16 DIAGNOSIS — I2510 Atherosclerotic heart disease of native coronary artery without angina pectoris: Secondary | ICD-10-CM | POA: Diagnosis not present

## 2017-02-16 DIAGNOSIS — E538 Deficiency of other specified B group vitamins: Secondary | ICD-10-CM | POA: Diagnosis not present

## 2017-02-16 DIAGNOSIS — G3184 Mild cognitive impairment, so stated: Secondary | ICD-10-CM | POA: Diagnosis not present

## 2017-02-16 DIAGNOSIS — R262 Difficulty in walking, not elsewhere classified: Secondary | ICD-10-CM | POA: Diagnosis not present

## 2017-02-16 DIAGNOSIS — G4733 Obstructive sleep apnea (adult) (pediatric): Secondary | ICD-10-CM | POA: Diagnosis not present

## 2017-02-16 DIAGNOSIS — Z9989 Dependence on other enabling machines and devices: Secondary | ICD-10-CM | POA: Diagnosis not present

## 2017-02-16 DIAGNOSIS — J449 Chronic obstructive pulmonary disease, unspecified: Secondary | ICD-10-CM | POA: Diagnosis not present

## 2017-02-16 DIAGNOSIS — I10 Essential (primary) hypertension: Secondary | ICD-10-CM | POA: Diagnosis not present

## 2017-02-17 DIAGNOSIS — H2513 Age-related nuclear cataract, bilateral: Secondary | ICD-10-CM | POA: Diagnosis not present

## 2017-02-17 DIAGNOSIS — C44629 Squamous cell carcinoma of skin of left upper limb, including shoulder: Secondary | ICD-10-CM | POA: Diagnosis not present

## 2017-02-17 DIAGNOSIS — L905 Scar conditions and fibrosis of skin: Secondary | ICD-10-CM | POA: Diagnosis not present

## 2017-02-18 ENCOUNTER — Ambulatory Visit (INDEPENDENT_AMBULATORY_CARE_PROVIDER_SITE_OTHER): Payer: Medicare Other | Admitting: Internal Medicine

## 2017-02-18 ENCOUNTER — Encounter: Payer: Self-pay | Admitting: Internal Medicine

## 2017-02-18 VITALS — BP 138/84 | HR 72 | Resp 16 | Ht 72.0 in | Wt 218.0 lb

## 2017-02-18 DIAGNOSIS — Z9989 Dependence on other enabling machines and devices: Secondary | ICD-10-CM

## 2017-02-18 DIAGNOSIS — J449 Chronic obstructive pulmonary disease, unspecified: Secondary | ICD-10-CM | POA: Diagnosis not present

## 2017-02-18 DIAGNOSIS — R262 Difficulty in walking, not elsewhere classified: Secondary | ICD-10-CM | POA: Diagnosis not present

## 2017-02-18 DIAGNOSIS — G4761 Periodic limb movement disorder: Secondary | ICD-10-CM

## 2017-02-18 DIAGNOSIS — I779 Disorder of arteries and arterioles, unspecified: Secondary | ICD-10-CM | POA: Diagnosis not present

## 2017-02-18 DIAGNOSIS — S52021D Displaced fracture of olecranon process without intraarticular extension of right ulna, subsequent encounter for closed fracture with routine healing: Secondary | ICD-10-CM | POA: Diagnosis not present

## 2017-02-18 DIAGNOSIS — G4733 Obstructive sleep apnea (adult) (pediatric): Secondary | ICD-10-CM

## 2017-02-18 DIAGNOSIS — I2510 Atherosclerotic heart disease of native coronary artery without angina pectoris: Secondary | ICD-10-CM | POA: Diagnosis not present

## 2017-02-18 DIAGNOSIS — I482 Chronic atrial fibrillation: Secondary | ICD-10-CM | POA: Diagnosis not present

## 2017-02-18 DIAGNOSIS — I10 Essential (primary) hypertension: Secondary | ICD-10-CM | POA: Diagnosis not present

## 2017-02-18 MED ORDER — PRAMIPEXOLE DIHYDROCHLORIDE 0.125 MG PO TABS: 0.1250 mg | ORAL_TABLET | Freq: Every day | ORAL | 2 refills | Status: DC

## 2017-02-18 NOTE — Patient Instructions (Signed)
Will start a medication to reduce your restless legs, take at bedtime.

## 2017-02-24 DIAGNOSIS — I10 Essential (primary) hypertension: Secondary | ICD-10-CM | POA: Diagnosis not present

## 2017-02-24 DIAGNOSIS — S52021D Displaced fracture of olecranon process without intraarticular extension of right ulna, subsequent encounter for closed fracture with routine healing: Secondary | ICD-10-CM | POA: Diagnosis not present

## 2017-02-24 DIAGNOSIS — I2510 Atherosclerotic heart disease of native coronary artery without angina pectoris: Secondary | ICD-10-CM | POA: Diagnosis not present

## 2017-02-24 DIAGNOSIS — J449 Chronic obstructive pulmonary disease, unspecified: Secondary | ICD-10-CM | POA: Diagnosis not present

## 2017-02-24 DIAGNOSIS — I482 Chronic atrial fibrillation: Secondary | ICD-10-CM | POA: Diagnosis not present

## 2017-02-24 DIAGNOSIS — R262 Difficulty in walking, not elsewhere classified: Secondary | ICD-10-CM | POA: Diagnosis not present

## 2017-02-26 ENCOUNTER — Encounter: Payer: Self-pay | Admitting: *Deleted

## 2017-02-27 DIAGNOSIS — I10 Essential (primary) hypertension: Secondary | ICD-10-CM | POA: Diagnosis not present

## 2017-02-27 DIAGNOSIS — S52021D Displaced fracture of olecranon process without intraarticular extension of right ulna, subsequent encounter for closed fracture with routine healing: Secondary | ICD-10-CM | POA: Diagnosis not present

## 2017-02-27 DIAGNOSIS — J449 Chronic obstructive pulmonary disease, unspecified: Secondary | ICD-10-CM | POA: Diagnosis not present

## 2017-02-27 DIAGNOSIS — I482 Chronic atrial fibrillation: Secondary | ICD-10-CM | POA: Diagnosis not present

## 2017-02-27 DIAGNOSIS — R262 Difficulty in walking, not elsewhere classified: Secondary | ICD-10-CM | POA: Diagnosis not present

## 2017-02-27 DIAGNOSIS — I2510 Atherosclerotic heart disease of native coronary artery without angina pectoris: Secondary | ICD-10-CM | POA: Diagnosis not present

## 2017-03-03 DIAGNOSIS — R262 Difficulty in walking, not elsewhere classified: Secondary | ICD-10-CM | POA: Diagnosis not present

## 2017-03-03 DIAGNOSIS — I482 Chronic atrial fibrillation: Secondary | ICD-10-CM | POA: Diagnosis not present

## 2017-03-03 DIAGNOSIS — I2510 Atherosclerotic heart disease of native coronary artery without angina pectoris: Secondary | ICD-10-CM | POA: Diagnosis not present

## 2017-03-03 DIAGNOSIS — I10 Essential (primary) hypertension: Secondary | ICD-10-CM | POA: Diagnosis not present

## 2017-03-03 DIAGNOSIS — S52021D Displaced fracture of olecranon process without intraarticular extension of right ulna, subsequent encounter for closed fracture with routine healing: Secondary | ICD-10-CM | POA: Diagnosis not present

## 2017-03-03 DIAGNOSIS — J449 Chronic obstructive pulmonary disease, unspecified: Secondary | ICD-10-CM | POA: Diagnosis not present

## 2017-03-04 DIAGNOSIS — I10 Essential (primary) hypertension: Secondary | ICD-10-CM | POA: Diagnosis not present

## 2017-03-04 DIAGNOSIS — R262 Difficulty in walking, not elsewhere classified: Secondary | ICD-10-CM | POA: Diagnosis not present

## 2017-03-04 DIAGNOSIS — I2510 Atherosclerotic heart disease of native coronary artery without angina pectoris: Secondary | ICD-10-CM | POA: Diagnosis not present

## 2017-03-04 DIAGNOSIS — S52021D Displaced fracture of olecranon process without intraarticular extension of right ulna, subsequent encounter for closed fracture with routine healing: Secondary | ICD-10-CM | POA: Diagnosis not present

## 2017-03-04 DIAGNOSIS — I482 Chronic atrial fibrillation: Secondary | ICD-10-CM | POA: Diagnosis not present

## 2017-03-04 DIAGNOSIS — J449 Chronic obstructive pulmonary disease, unspecified: Secondary | ICD-10-CM | POA: Diagnosis not present

## 2017-03-05 ENCOUNTER — Encounter
Admission: RE | Disposition: A | Discharge status: Home / Self Care | Payer: Self-pay | Source: Ambulatory Visit | Attending: Ophthalmology

## 2017-03-05 ENCOUNTER — Encounter: Payer: Self-pay | Admitting: Anesthesiology

## 2017-03-05 ENCOUNTER — Ambulatory Visit: Payer: Medicare Other | Admitting: Anesthesiology

## 2017-03-05 ENCOUNTER — Ambulatory Visit
Admission: RE | Admit: 2017-03-05 | Discharge: 2017-03-05 | Disposition: A | Discharge status: Home / Self Care | Payer: Medicare Other | Source: Ambulatory Visit | Attending: Ophthalmology | Admitting: Ophthalmology

## 2017-03-05 DIAGNOSIS — Z9989 Dependence on other enabling machines and devices: Secondary | ICD-10-CM | POA: Insufficient documentation

## 2017-03-05 DIAGNOSIS — Z7901 Long term (current) use of anticoagulants: Secondary | ICD-10-CM | POA: Diagnosis not present

## 2017-03-05 DIAGNOSIS — Z87891 Personal history of nicotine dependence: Secondary | ICD-10-CM | POA: Diagnosis not present

## 2017-03-05 DIAGNOSIS — Z79899 Other long term (current) drug therapy: Secondary | ICD-10-CM | POA: Diagnosis not present

## 2017-03-05 DIAGNOSIS — G4730 Sleep apnea, unspecified: Secondary | ICD-10-CM | POA: Insufficient documentation

## 2017-03-05 DIAGNOSIS — Z955 Presence of coronary angioplasty implant and graft: Secondary | ICD-10-CM | POA: Diagnosis not present

## 2017-03-05 DIAGNOSIS — H2511 Age-related nuclear cataract, right eye: Secondary | ICD-10-CM | POA: Diagnosis not present

## 2017-03-05 DIAGNOSIS — I2510 Atherosclerotic heart disease of native coronary artery without angina pectoris: Secondary | ICD-10-CM | POA: Insufficient documentation

## 2017-03-05 DIAGNOSIS — I4891 Unspecified atrial fibrillation: Secondary | ICD-10-CM | POA: Diagnosis not present

## 2017-03-05 DIAGNOSIS — J449 Chronic obstructive pulmonary disease, unspecified: Secondary | ICD-10-CM | POA: Diagnosis not present

## 2017-03-05 DIAGNOSIS — I10 Essential (primary) hypertension: Secondary | ICD-10-CM | POA: Insufficient documentation

## 2017-03-05 DIAGNOSIS — F419 Anxiety disorder, unspecified: Secondary | ICD-10-CM | POA: Diagnosis not present

## 2017-03-05 DIAGNOSIS — E039 Hypothyroidism, unspecified: Secondary | ICD-10-CM | POA: Diagnosis not present

## 2017-03-05 DIAGNOSIS — H2513 Age-related nuclear cataract, bilateral: Secondary | ICD-10-CM | POA: Diagnosis not present

## 2017-03-05 HISTORY — DX: Unspecified hearing loss, unspecified ear: H91.90

## 2017-03-05 HISTORY — DX: Personal history of other diseases of the digestive system: Z87.19

## 2017-03-05 HISTORY — DX: Unspecified osteoarthritis, unspecified site: M19.90

## 2017-03-05 HISTORY — DX: Sleep apnea, unspecified: G47.30

## 2017-03-05 HISTORY — DX: Disorder of prostate, unspecified: N42.9

## 2017-03-05 HISTORY — PX: CATARACT EXTRACTION W/PHACO: SHX586

## 2017-03-05 SURGERY — PHACOEMULSIFICATION, CATARACT, WITH IOL INSERTION
Anesthesia: Monitor Anesthesia Care | Site: Eye | Laterality: Right | Wound class: Clean

## 2017-03-05 MED ORDER — POVIDONE-IODINE 5 % OP SOLN
OPHTHALMIC | Status: AC
  Filled 2017-03-05: qty 30

## 2017-03-05 MED ORDER — EPINEPHRINE PF 1 MG/ML IJ SOLN
INTRAOCULAR | Status: DC | PRN
  Administered 2017-03-05: 09:00:00 via OPHTHALMIC

## 2017-03-05 MED ORDER — NA HYALUR & NA CHOND-NA HYALUR 0.55-0.5 ML IO KIT
PACK | INTRAOCULAR | Status: AC
  Filled 2017-03-05: qty 2.1

## 2017-03-05 MED ORDER — ARMC OPHTHALMIC DILATING DROPS
1.0000 "application " | OPHTHALMIC | Status: AC
  Administered 2017-03-05 (×3): 1 via OPHTHALMIC

## 2017-03-05 MED ORDER — EPINEPHRINE PF 1 MG/ML IJ SOLN
INTRAMUSCULAR | Status: AC
  Filled 2017-03-05: qty 2

## 2017-03-05 MED ORDER — SODIUM CHLORIDE 0.9 % IV SOLN
INTRAVENOUS | Status: DC
  Administered 2017-03-05 (×2): via INTRAVENOUS

## 2017-03-05 MED ORDER — MOXIFLOXACIN HCL 0.5 % OP SOLN
1.0000 [drp] | OPHTHALMIC | Status: AC
  Administered 2017-03-05 (×3): 1 [drp] via OPHTHALMIC

## 2017-03-05 MED ORDER — MOXIFLOXACIN HCL 0.5 % OP SOLN
OPHTHALMIC | Status: AC
  Filled 2017-03-05: qty 3

## 2017-03-05 MED ORDER — ARMC OPHTHALMIC DILATING DROPS
OPHTHALMIC | Status: AC
  Administered 2017-03-05: 1 via OPHTHALMIC
  Filled 2017-03-05: qty 0.4

## 2017-03-05 MED ORDER — NA HYALUR & NA CHOND-NA HYALUR 0.4-0.35 ML IO KIT
PACK | INTRAOCULAR | Status: DC | PRN
  Administered 2017-03-05 (×2): .35 mL via INTRAOCULAR

## 2017-03-05 MED ORDER — CARBACHOL 0.01 % IO SOLN
INTRAOCULAR | Status: DC | PRN
  Administered 2017-03-05: 0.5 mL via INTRAOCULAR

## 2017-03-05 MED ORDER — LIDOCAINE HCL (PF) 4 % IJ SOLN
INTRAMUSCULAR | Status: DC | PRN
  Administered 2017-03-05: 4 mL via OPHTHALMIC

## 2017-03-05 MED ORDER — NEOMYCIN-POLYMYXIN-DEXAMETH 3.5-10000-0.1 OP OINT
TOPICAL_OINTMENT | OPHTHALMIC | Status: AC
  Filled 2017-03-05: qty 3.5

## 2017-03-05 MED ORDER — MIDAZOLAM HCL 5 MG/5ML IJ SOLN
INTRAMUSCULAR | Status: DC | PRN
  Administered 2017-03-05: 1 mg via INTRAVENOUS

## 2017-03-05 MED ORDER — MIDAZOLAM HCL 2 MG/2ML IJ SOLN
INTRAMUSCULAR | Status: AC
  Filled 2017-03-05: qty 2

## 2017-03-05 MED ORDER — NEOMYCIN-POLYMYXIN-DEXAMETH 0.1 % OP OINT
TOPICAL_OINTMENT | OPHTHALMIC | Status: DC | PRN
  Administered 2017-03-05: 1 via OPHTHALMIC

## 2017-03-05 MED ORDER — LIDOCAINE HCL (PF) 4 % IJ SOLN
INTRAMUSCULAR | Status: AC
  Filled 2017-03-05: qty 5

## 2017-03-05 MED ORDER — POVIDONE-IODINE 5 % OP SOLN
OPHTHALMIC | Status: DC | PRN
  Administered 2017-03-05: 1 via OPHTHALMIC

## 2017-03-05 SURGICAL SUPPLY — 15 items
GLOVE BIO SURGEON STRL SZ8 (GLOVE) ×2 IMPLANT
GLOVE BIOGEL M 6.5 STRL (GLOVE) ×2 IMPLANT
GLOVE SURG LX 7.5 STRW (GLOVE) ×1
GLOVE SURG LX STRL 7.5 STRW (GLOVE) ×1 IMPLANT
GOWN STRL REUS W/ TWL LRG LVL3 (GOWN DISPOSABLE) ×2 IMPLANT
GOWN STRL REUS W/TWL LRG LVL3 (GOWN DISPOSABLE) ×4
LENS IOL ACRYSOF IQ 20.0 (Intraocular Lens) ×1 IMPLANT
PACK CATARACT (MISCELLANEOUS) ×2 IMPLANT
PACK CATARACT BRASINGTON LX (MISCELLANEOUS) ×2 IMPLANT
PACK EYE AFTER SURG (MISCELLANEOUS) ×2 IMPLANT
SOL BSS BAG (MISCELLANEOUS) ×2
SOLUTION BSS BAG (MISCELLANEOUS) ×1 IMPLANT
SYR 5ML LL (SYRINGE) ×2 IMPLANT
WATER STERILE IRR 250ML POUR (IV SOLUTION) ×2 IMPLANT
WIPE NON LINTING 3.25X3.25 (MISCELLANEOUS) ×2 IMPLANT

## 2017-03-05 NOTE — Transfer of Care (Signed)
Immediate Anesthesia Transfer of Care Note  Patient: [name]  Procedure(s) Performed: Procedure(s) with comments: CATARACT EXTRACTION PHACO AND INTRAOCULAR LENS PLACEMENT (IOC) (Right) - [place] 00:58.5 AP% 10.6 CDE 6.20 Fluid Pack lot # [id number]  Patient Location: PACU  Anesthesia Type:MAC  Level of Consciousness: awake, alert , oriented and patient cooperative  Airway & Oxygen Therapy: Patient Spontanous Breathing  Post-op Assessment: Report given to RN, Post -op Vital signs reviewed and stable and Patient moving all extremities X 4  Post vital signs: Reviewed and stable  Last Vitals:  Vitals:   03/05/17 0654 03/05/17 0854  BP: (!) 174/89 (!) 148/68  Pulse: 60 (!) 54  Resp: 16   Temp: (!) 36.3 C     Last Pain:  Vitals:   03/05/17 0654  TempSrc: Tympanic         Complications: No apparent anesthesia complications

## 2017-03-05 NOTE — H&P (Signed)
The History and Physical notes are on paper, have been signed, and are to be scanned. The patient remains stable and unchanged from the H&P.   Previous H&P reviewed, patient examined, and there are no changes.  [name] 03/05/2017 7:28 AM

## 2017-03-05 NOTE — Anesthesia Postprocedure Evaluation (Signed)
Anesthesia Post Note  Patient: BRANDIN STETZER  Procedure(s) Performed: Procedure(s) (LRB): CATARACT EXTRACTION PHACO AND INTRAOCULAR LENS PLACEMENT (IOC) (Right)  Patient location during evaluation: PACU Anesthesia Type: MAC Level of consciousness: awake and alert Pain management: pain level controlled Vital Signs Assessment: post-procedure vital signs reviewed and stable Respiratory status: spontaneous breathing, nonlabored ventilation and respiratory function stable Cardiovascular status: stable and blood pressure returned to baseline Anesthetic complications: no     Last Vitals:  Vitals:   03/05/17 0654 03/05/17 0854  BP: (!) 174/89 (!) 148/68  Pulse: 60 (!) 54  Resp: 16   Temp: (!) 36.3 C     Last Pain:  Vitals:   03/05/17 0654  TempSrc: Tympanic                 Silvana Newness A

## 2017-03-05 NOTE — Anesthesia Post-op Follow-up Note (Cosign Needed)
Anesthesia QCDR form completed.        

## 2017-03-05 NOTE — Op Note (Signed)
OPERATIVE NOTE  [name] [id number] 03/05/2017   PREOPERATIVE DIAGNOSIS:  Nuclear Sclerotic Cataract Right Eye H25.11   POSTOPERATIVE DIAGNOSIS: Nuclear Sclerotic Cataract Right Eye H25.11          PROCEDURE:  Phacoemusification with posterior chamber intraocular lens placement of the right eye   LENS:   Implant Name Type Inv. Item Serial No. Manufacturer Lot No. LRB No. Used  LENS IOL ACRYSOF IQ 20.0 - [id number] Intraocular Lens LENS IOL ACRYSOF IQ 20.0 [id number] ALCON  Right 1  LENS IOL ACRYSOF IQ 20.0 - [id number] Intraocular Lens LENS IOL ACRYSOF IQ 20.0 [id number] ALCON   Right 1   1st IOL did not enter eye (held up at incision) - 2nd implant placed in eye.    ULTRASOUND TIME: 11 %  of 0 minutes 59 seconds, CDE 6.2  SURGEON:  Wyonia Hough, MD   ANESTHESIA:  Topical with tetracaine drops and 2% Xylocaine jelly, augmented with 1% preservative-free intracameral lidocaine.    COMPLICATIONS:  None.   DESCRIPTION OF PROCEDURE:  The patient was identified in the holding room and transported to the operating room and placed in the supine position under the operating microscope. Theright eye was identified as the operative eye and it was prepped and draped in the usual sterile ophthalmic fashion.   A 1 millimeter clear-corneal paracentesis was made at the 12:00 position.  0.5 ml of preservative-free 1% lidocaine was injected into the anterior chamber. The anterior chamber was filled with Viscoat viscoelastic.  A 2.4 millimeter keratome was used to make a near-clear corneal incision at the 9:00 position. A curvilinear capsulorrhexis was made with a cystotome and capsulorrhexis forceps.  Balanced salt solution was used to hydrodissect and hydrodelineate the nucleus.   Phacoemulsification was then used in stop and chop fashion to remove the lens nucleus and epinucleus.  The remaining cortex was then removed using the irrigation and aspiration handpiece. Provisc  was then placed into the capsular bag to distend it for lens placement.  A lens was then injected into the capsular bag.  The remaining viscoelastic was aspirated.  Wounds were hydrated with balanced salt solution.  The anterior chamber was inflated to a physiologic pressure with balanced salt solution. Vigamox 0.2 ml of a 1mg  per ml solution was injected into the anterior chamber for a dose of 0.2 mg of intracameral antibiotic at the completion of the case. Miostat was placed into the anterior chamber to constrict the pupil.  No wound leaks were noted.  Topical Vigamox drops and Maxitrol ointment were applied to the eye.  The patient was taken to the recovery room in stable condition without complications of anesthesia or surgery.  [name] 03/05/2017, 8:51 AM

## 2017-03-05 NOTE — OR Nursing (Signed)
Dr. Marcello Moores notified that patient did not have dose of beta blocker today, no new orders at this time.

## 2017-03-05 NOTE — Anesthesia Preprocedure Evaluation (Addendum)
Anesthesia Evaluation  Patient identified by MRN, date of birth, ID band Patient awake    Reviewed: Allergy & Precautions, NPO status , Patient's Chart, lab work & pertinent test results, reviewed documented beta blocker date and time   Airway Mallampati: II  TM Distance: >3 FB     Dental  (+) Upper Dentures, Lower Dentures   Pulmonary sleep apnea , COPD, former smoker,           Cardiovascular hypertension, Pt. on medications and Pt. on home beta blockers + CAD, + CABG and + Peripheral Vascular Disease  + dysrhythmias      Neuro/Psych PSYCHIATRIC DISORDERS Anxiety  Neuromuscular disease    GI/Hepatic hiatal hernia, GERD  Controlled,  Endo/Other  Hypothyroidism   Renal/GU      Musculoskeletal  (+) Arthritis ,   Abdominal   Peds  Hematology   Anesthesia Other Findings Hypertensive this am. EKG looks better than old. Old Rbbb.  Reproductive/Obstetrics                           Anesthesia Physical Anesthesia Plan  ASA: III  Anesthesia Plan: MAC   Post-op Pain Management:    Induction:   PONV Risk Score and Plan:   Airway Management Planned:   Additional Equipment:   Intra-op Plan:   Post-operative Plan:   Informed Consent: I have reviewed the patients History and Physical, chart, labs and discussed the procedure including the risks, benefits and alternatives for the proposed anesthesia with the patient or authorized representative who has indicated his/her understanding and acceptance.     Plan Discussed with: CRNA  Anesthesia Plan Comments:         Anesthesia Quick Evaluation

## 2017-03-06 ENCOUNTER — Encounter: Payer: Self-pay | Admitting: Ophthalmology

## 2017-03-10 DIAGNOSIS — I2510 Atherosclerotic heart disease of native coronary artery without angina pectoris: Secondary | ICD-10-CM | POA: Diagnosis not present

## 2017-03-10 DIAGNOSIS — I482 Chronic atrial fibrillation: Secondary | ICD-10-CM | POA: Diagnosis not present

## 2017-03-10 DIAGNOSIS — S52021D Displaced fracture of olecranon process without intraarticular extension of right ulna, subsequent encounter for closed fracture with routine healing: Secondary | ICD-10-CM | POA: Diagnosis not present

## 2017-03-10 DIAGNOSIS — R262 Difficulty in walking, not elsewhere classified: Secondary | ICD-10-CM | POA: Diagnosis not present

## 2017-03-10 DIAGNOSIS — I10 Essential (primary) hypertension: Secondary | ICD-10-CM | POA: Diagnosis not present

## 2017-03-10 DIAGNOSIS — J449 Chronic obstructive pulmonary disease, unspecified: Secondary | ICD-10-CM | POA: Diagnosis not present

## 2017-03-13 DIAGNOSIS — R262 Difficulty in walking, not elsewhere classified: Secondary | ICD-10-CM | POA: Diagnosis not present

## 2017-03-13 DIAGNOSIS — I2510 Atherosclerotic heart disease of native coronary artery without angina pectoris: Secondary | ICD-10-CM | POA: Diagnosis not present

## 2017-03-13 DIAGNOSIS — S52021D Displaced fracture of olecranon process without intraarticular extension of right ulna, subsequent encounter for closed fracture with routine healing: Secondary | ICD-10-CM | POA: Diagnosis not present

## 2017-03-13 DIAGNOSIS — I10 Essential (primary) hypertension: Secondary | ICD-10-CM | POA: Diagnosis not present

## 2017-03-13 DIAGNOSIS — I482 Chronic atrial fibrillation: Secondary | ICD-10-CM | POA: Diagnosis not present

## 2017-03-13 DIAGNOSIS — J449 Chronic obstructive pulmonary disease, unspecified: Secondary | ICD-10-CM | POA: Diagnosis not present

## 2017-03-18 ENCOUNTER — Other Ambulatory Visit: Payer: Self-pay | Admitting: Family Medicine

## 2017-03-18 DIAGNOSIS — E785 Hyperlipidemia, unspecified: Secondary | ICD-10-CM

## 2017-03-19 DIAGNOSIS — L439 Lichen planus, unspecified: Secondary | ICD-10-CM | POA: Diagnosis not present

## 2017-03-23 DIAGNOSIS — Z967 Presence of other bone and tendon implants: Secondary | ICD-10-CM | POA: Diagnosis not present

## 2017-03-23 DIAGNOSIS — Z8781 Personal history of (healed) traumatic fracture: Secondary | ICD-10-CM | POA: Diagnosis not present

## 2017-03-27 DIAGNOSIS — H2512 Age-related nuclear cataract, left eye: Secondary | ICD-10-CM | POA: Diagnosis not present

## 2017-04-02 ENCOUNTER — Encounter: Payer: Self-pay | Admitting: *Deleted

## 2017-04-13 NOTE — Discharge Instructions (Signed)

## 2017-04-15 ENCOUNTER — Ambulatory Visit: Payer: Medicare Other | Admitting: Anesthesiology

## 2017-04-15 ENCOUNTER — Encounter: Payer: Self-pay | Admitting: *Deleted

## 2017-04-15 ENCOUNTER — Ambulatory Visit
Admission: RE | Admit: 2017-04-15 | Discharge: 2017-04-15 | Disposition: A | Discharge status: Home / Self Care | Payer: Medicare Other | Source: Ambulatory Visit | Attending: Ophthalmology | Admitting: Ophthalmology

## 2017-04-15 ENCOUNTER — Encounter
Admission: RE | Disposition: A | Discharge status: Home / Self Care | Payer: Self-pay | Source: Ambulatory Visit | Attending: Ophthalmology

## 2017-04-15 DIAGNOSIS — E7800 Pure hypercholesterolemia, unspecified: Secondary | ICD-10-CM | POA: Insufficient documentation

## 2017-04-15 DIAGNOSIS — I2510 Atherosclerotic heart disease of native coronary artery without angina pectoris: Secondary | ICD-10-CM | POA: Diagnosis not present

## 2017-04-15 DIAGNOSIS — Z9889 Other specified postprocedural states: Secondary | ICD-10-CM | POA: Diagnosis not present

## 2017-04-15 DIAGNOSIS — Z79899 Other long term (current) drug therapy: Secondary | ICD-10-CM | POA: Insufficient documentation

## 2017-04-15 DIAGNOSIS — N400 Enlarged prostate without lower urinary tract symptoms: Secondary | ICD-10-CM | POA: Diagnosis not present

## 2017-04-15 DIAGNOSIS — Z7951 Long term (current) use of inhaled steroids: Secondary | ICD-10-CM | POA: Diagnosis not present

## 2017-04-15 DIAGNOSIS — Z85828 Personal history of other malignant neoplasm of skin: Secondary | ICD-10-CM | POA: Insufficient documentation

## 2017-04-15 DIAGNOSIS — Z87891 Personal history of nicotine dependence: Secondary | ICD-10-CM | POA: Diagnosis not present

## 2017-04-15 DIAGNOSIS — J449 Chronic obstructive pulmonary disease, unspecified: Secondary | ICD-10-CM | POA: Insufficient documentation

## 2017-04-15 DIAGNOSIS — Z95820 Peripheral vascular angioplasty status with implants and grafts: Secondary | ICD-10-CM | POA: Diagnosis not present

## 2017-04-15 DIAGNOSIS — M1990 Unspecified osteoarthritis, unspecified site: Secondary | ICD-10-CM | POA: Insufficient documentation

## 2017-04-15 DIAGNOSIS — Z951 Presence of aortocoronary bypass graft: Secondary | ICD-10-CM | POA: Diagnosis not present

## 2017-04-15 DIAGNOSIS — K219 Gastro-esophageal reflux disease without esophagitis: Secondary | ICD-10-CM | POA: Insufficient documentation

## 2017-04-15 DIAGNOSIS — E039 Hypothyroidism, unspecified: Secondary | ICD-10-CM | POA: Diagnosis not present

## 2017-04-15 DIAGNOSIS — Z7901 Long term (current) use of anticoagulants: Secondary | ICD-10-CM | POA: Diagnosis not present

## 2017-04-15 DIAGNOSIS — Z885 Allergy status to narcotic agent status: Secondary | ICD-10-CM | POA: Diagnosis not present

## 2017-04-15 DIAGNOSIS — I10 Essential (primary) hypertension: Secondary | ICD-10-CM | POA: Insufficient documentation

## 2017-04-15 DIAGNOSIS — G4730 Sleep apnea, unspecified: Secondary | ICD-10-CM | POA: Insufficient documentation

## 2017-04-15 DIAGNOSIS — Z888 Allergy status to other drugs, medicaments and biological substances status: Secondary | ICD-10-CM | POA: Insufficient documentation

## 2017-04-15 DIAGNOSIS — I4891 Unspecified atrial fibrillation: Secondary | ICD-10-CM | POA: Diagnosis not present

## 2017-04-15 DIAGNOSIS — H2512 Age-related nuclear cataract, left eye: Secondary | ICD-10-CM | POA: Diagnosis not present

## 2017-04-15 HISTORY — PX: CATARACT EXTRACTION W/PHACO: SHX586

## 2017-04-15 SURGERY — PHACOEMULSIFICATION, CATARACT, WITH IOL INSERTION
Anesthesia: Monitor Anesthesia Care | Laterality: Left | Wound class: Clean

## 2017-04-15 MED ORDER — BRIMONIDINE TARTRATE-TIMOLOL 0.2-0.5 % OP SOLN
OPHTHALMIC | Status: DC | PRN
  Administered 2017-04-15: 1 [drp] via OPHTHALMIC

## 2017-04-15 MED ORDER — CEFUROXIME OPHTHALMIC INJECTION 1 MG/0.1 ML
INJECTION | OPHTHALMIC | Status: DC | PRN
  Administered 2017-04-15: 0.1 mL via OPHTHALMIC

## 2017-04-15 MED ORDER — NA HYALUR & NA CHOND-NA HYALUR 0.4-0.35 ML IO KIT
PACK | INTRAOCULAR | Status: DC | PRN
  Administered 2017-04-15: 1 mL via INTRAOCULAR

## 2017-04-15 MED ORDER — LIDOCAINE HCL (PF) 2 % IJ SOLN
INTRAOCULAR | Status: DC | PRN
  Administered 2017-04-15: 1 mL via INTRAOCULAR

## 2017-04-15 MED ORDER — EPINEPHRINE PF 1 MG/ML IJ SOLN
INTRAMUSCULAR | Status: DC | PRN
  Administered 2017-04-15: 71 mL via OPHTHALMIC

## 2017-04-15 MED ORDER — MIDAZOLAM HCL 2 MG/2ML IJ SOLN
INTRAMUSCULAR | Status: DC | PRN
  Administered 2017-04-15: 2 mg via INTRAVENOUS

## 2017-04-15 MED ORDER — MOXIFLOXACIN HCL 0.5 % OP SOLN
1.0000 [drp] | OPHTHALMIC | Status: DC | PRN
Start: 2017-04-15 — End: 2017-04-15
  Administered 2017-04-15 (×3): 1 [drp] via OPHTHALMIC

## 2017-04-15 MED ORDER — ARMC OPHTHALMIC DILATING DROPS
1.0000 "application " | OPHTHALMIC | Status: DC | PRN
  Administered 2017-04-15 (×3): 1 via OPHTHALMIC

## 2017-04-15 MED ORDER — FENTANYL CITRATE (PF) 100 MCG/2ML IJ SOLN
INTRAMUSCULAR | Status: DC | PRN
  Administered 2017-04-15: 50 ug via INTRAVENOUS

## 2017-04-15 SURGICAL SUPPLY — 27 items
CANNULA ANT/CHMB 27G (MISCELLANEOUS) ×1 IMPLANT
CANNULA ANT/CHMB 27GA (MISCELLANEOUS) ×2 IMPLANT
CARTRIDGE ABBOTT (MISCELLANEOUS) IMPLANT
GLOVE SURG LX 7.5 STRW (GLOVE) ×1
GLOVE SURG LX STRL 7.5 STRW (GLOVE) ×1 IMPLANT
GLOVE SURG TRIUMPH 8.0 PF LTX (GLOVE) ×2 IMPLANT
GOWN STRL REUS W/ TWL LRG LVL3 (GOWN DISPOSABLE) ×2 IMPLANT
GOWN STRL REUS W/TWL LRG LVL3 (GOWN DISPOSABLE) ×4
LENS IOL ACRYSOF IQ 20.0 (Intraocular Lens) ×1 IMPLANT
MARKER SKIN DUAL TIP RULER LAB (MISCELLANEOUS) ×2 IMPLANT
NDL FILTER BLUNT 18X1 1/2 (NEEDLE) ×1 IMPLANT
NDL RETROBULBAR .5 NSTRL (NEEDLE) IMPLANT
NEEDLE FILTER BLUNT 18X 1/2SAF (NEEDLE) ×1
NEEDLE FILTER BLUNT 18X1 1/2 (NEEDLE) ×1 IMPLANT
PACK CATARACT BRASINGTON (MISCELLANEOUS) ×2 IMPLANT
PACK EYE AFTER SURG (MISCELLANEOUS) ×2 IMPLANT
PACK OPTHALMIC (MISCELLANEOUS) ×2 IMPLANT
RING MALYGIN 7.0 (MISCELLANEOUS) IMPLANT
SUT ETHILON 10-0 CS-B-6CS-B-6 (SUTURE)
SUT VICRYL  9 0 (SUTURE)
SUT VICRYL 9 0 (SUTURE) IMPLANT
SUTURE EHLN 10-0 CS-B-6CS-B-6 (SUTURE) IMPLANT
SYR 3ML LL SCALE MARK (SYRINGE) ×2 IMPLANT
SYR 5ML LL (SYRINGE) ×2 IMPLANT
SYR TB 1ML LUER SLIP (SYRINGE) ×2 IMPLANT
WATER STERILE IRR 250ML POUR (IV SOLUTION) ×2 IMPLANT
WIPE NON LINTING 3.25X3.25 (MISCELLANEOUS) ×2 IMPLANT

## 2017-04-15 NOTE — Op Note (Signed)
OPERATIVE NOTE  [name] [id number] 04/15/2017   PREOPERATIVE DIAGNOSIS:  Nuclear sclerotic cataract left eye. H25.12   POSTOPERATIVE DIAGNOSIS:    Nuclear sclerotic cataract left eye.     PROCEDURE:  Phacoemusification with posterior chamber intraocular lens placement of the left eye   LENS:   Implant Name Type Inv. Item Serial No. Manufacturer Lot No. LRB No. Used  LENS IOL ACRYSOF IQ 20.0 - [id number] Intraocular Lens LENS IOL ACRYSOF IQ 20.0 [id number] ALCON   Left 1        ULTRASOUND TIME: 16  % of 1 minutes 45 seconds, CDE 17.3  SURGEON:  Wyonia Hough, MD   ANESTHESIA:  Topical with tetracaine drops and 2% Xylocaine jelly, augmented with 1% preservative-free intracameral lidocaine.    COMPLICATIONS:  None.   DESCRIPTION OF PROCEDURE:  The patient was identified in the holding room and transported to the operating room and placed in the supine position under the operating microscope.  The left eye was identified as the operative eye and it was prepped and draped in the usual sterile ophthalmic fashion.   A 1 millimeter clear-corneal paracentesis was made at the 1:30 position.  0.5 ml of preservative-free 1% lidocaine was injected into the anterior chamber.  The anterior chamber was filled with Viscoat viscoelastic.  A 2.4 millimeter keratome was used to make a near-clear corneal incision at the 10:30 position.  .  A curvilinear capsulorrhexis was made with a cystotome and capsulorrhexis forceps.  Balanced salt solution was used to hydrodissect and hydrodelineate the nucleus.   Phacoemulsification was then used in stop and chop fashion to remove the lens nucleus and epinucleus.  The remaining cortex was then removed using the irrigation and aspiration handpiece. Provisc was then placed into the capsular bag to distend it for lens placement.  A lens was then injected into the capsular bag.  The remaining viscoelastic was aspirated.   Wounds were hydrated with  balanced salt solution.  The anterior chamber was inflated to a physiologic pressure with balanced salt solution.  No wound leaks were noted. Cefuroxime 0.1 ml of a 10mg /ml solution was injected into the anterior chamber for a dose of 1 mg of intracameral antibiotic at the completion of the case.   Timolol and Brimonidine drops were applied to the eye.  The patient was taken to the recovery room in stable condition without complications of anesthesia or surgery.  [name] 04/15/2017, 10:22 AM

## 2017-04-15 NOTE — Anesthesia Postprocedure Evaluation (Signed)
Anesthesia Post Note  Patient: [name]  Procedure(s) Performed: Procedure(s) (LRB): CATARACT EXTRACTION PHACO AND INTRAOCULAR LENS PLACEMENT (IOC)  Left (Left)  Patient location during evaluation: PACU Anesthesia Type: MAC Level of consciousness: awake and alert and oriented Pain management: satisfactory to patient Vital Signs Assessment: post-procedure vital signs reviewed and stable Respiratory status: spontaneous breathing, nonlabored ventilation and respiratory function stable Cardiovascular status: blood pressure returned to baseline and stable Postop Assessment: Adequate PO intake and No signs of nausea or vomiting Anesthetic complications: no    Raliegh Ip

## 2017-04-15 NOTE — Transfer of Care (Signed)
Immediate Anesthesia Transfer of Care Note  Patient: [name]  Procedure(s) Performed: Procedure(s): CATARACT EXTRACTION PHACO AND INTRAOCULAR LENS PLACEMENT (IOC)  Left (Left)  Patient Location: PACU  Anesthesia Type: MAC  Level of Consciousness: awake, alert  and patient cooperative  Airway and Oxygen Therapy: Patient Spontanous Breathing and Patient connected to supplemental oxygen  Post-op Assessment: Post-op Vital signs reviewed, Patient's Cardiovascular Status Stable, Respiratory Function Stable, Patent Airway and No signs of Nausea or vomiting  Post-op Vital Signs: Reviewed and stable  Complications: No apparent anesthesia complications

## 2017-04-15 NOTE — Anesthesia Preprocedure Evaluation (Signed)
Anesthesia Evaluation  Patient identified by MRN, date of birth, ID band Patient awake    Reviewed: Allergy & Precautions, H&P , NPO status , Patient's Chart, lab work & pertinent test results  Airway Mallampati: II  TM Distance: >3 FB Neck ROM: full    Dental  (+) Upper Dentures, Lower Dentures   Pulmonary sleep apnea , COPD, former smoker,    Pulmonary exam normal breath sounds clear to auscultation       Cardiovascular hypertension, + CAD  Normal cardiovascular exam Rhythm:regular Rate:Normal     Neuro/Psych    GI/Hepatic GERD  ,  Endo/Other  Hypothyroidism   Renal/GU      Musculoskeletal   Abdominal   Peds  Hematology   Anesthesia Other Findings   Reproductive/Obstetrics                             Anesthesia Physical Anesthesia Plan  ASA: III  Anesthesia Plan: MAC   Post-op Pain Management:    Induction:   PONV Risk Score and Plan: 1 and Midazolam  Airway Management Planned:   Additional Equipment:   Intra-op Plan:   Post-operative Plan:   Informed Consent: I have reviewed the patients History and Physical, chart, labs and discussed the procedure including the risks, benefits and alternatives for the proposed anesthesia with the patient or authorized representative who has indicated his/her understanding and acceptance.     Plan Discussed with: CRNA  Anesthesia Plan Comments:         Anesthesia Quick Evaluation

## 2017-04-15 NOTE — H&P (Signed)
The History and Physical notes are on paper, have been signed, and are to be scanned. The patient remains stable and unchanged from the H&P.   Previous H&P reviewed, patient examined, and there are no changes.  [name] 04/15/2017 9:22 AM

## 2017-04-15 NOTE — Anesthesia Procedure Notes (Signed)
Procedure Name: MAC Performed by: Mayme Genta Pre-anesthesia Checklist: Patient identified, Emergency Drugs available, Suction available, Timeout performed and Patient being monitored Patient Re-evaluated:Patient Re-evaluated prior to induction Oxygen Delivery Method: Nasal cannula Placement Confirmation: positive ETCO2

## 2017-04-16 ENCOUNTER — Encounter: Payer: Self-pay | Admitting: Ophthalmology

## 2017-04-29 ENCOUNTER — Ambulatory Visit (INDEPENDENT_AMBULATORY_CARE_PROVIDER_SITE_OTHER): Payer: Medicare Other | Admitting: Family Medicine

## 2017-04-29 VITALS — BP 130/80 | HR 73 | Temp 98.3°F | Resp 16 | Ht 72.0 in | Wt 226.0 lb

## 2017-04-29 DIAGNOSIS — I779 Disorder of arteries and arterioles, unspecified: Secondary | ICD-10-CM

## 2017-04-29 DIAGNOSIS — E785 Hyperlipidemia, unspecified: Secondary | ICD-10-CM

## 2017-04-29 DIAGNOSIS — E538 Deficiency of other specified B group vitamins: Secondary | ICD-10-CM | POA: Diagnosis not present

## 2017-04-29 DIAGNOSIS — D692 Other nonthrombocytopenic purpura: Secondary | ICD-10-CM

## 2017-04-29 DIAGNOSIS — I2510 Atherosclerotic heart disease of native coronary artery without angina pectoris: Secondary | ICD-10-CM

## 2017-04-29 DIAGNOSIS — Z23 Encounter for immunization: Secondary | ICD-10-CM | POA: Diagnosis not present

## 2017-04-29 DIAGNOSIS — R296 Repeated falls: Secondary | ICD-10-CM | POA: Diagnosis not present

## 2017-04-29 DIAGNOSIS — I10 Essential (primary) hypertension: Secondary | ICD-10-CM

## 2017-04-29 DIAGNOSIS — G4733 Obstructive sleep apnea (adult) (pediatric): Secondary | ICD-10-CM | POA: Diagnosis not present

## 2017-04-29 DIAGNOSIS — I480 Paroxysmal atrial fibrillation: Secondary | ICD-10-CM

## 2017-04-29 DIAGNOSIS — Z9989 Dependence on other enabling machines and devices: Secondary | ICD-10-CM | POA: Diagnosis not present

## 2017-04-29 DIAGNOSIS — D519 Vitamin B12 deficiency anemia, unspecified: Secondary | ICD-10-CM | POA: Diagnosis not present

## 2017-04-29 DIAGNOSIS — Z0000 Encounter for general adult medical examination without abnormal findings: Secondary | ICD-10-CM | POA: Diagnosis not present

## 2017-04-29 DIAGNOSIS — D649 Anemia, unspecified: Secondary | ICD-10-CM | POA: Insufficient documentation

## 2017-04-29 DIAGNOSIS — E039 Hypothyroidism, unspecified: Secondary | ICD-10-CM

## 2017-04-29 NOTE — Patient Instructions (Addendum)
I recommend that you get a flu vaccine this year. Please call our office at [phone] at your earliest convenience to schedule a flu shot.    Preventive Care 71 Years and Older, Male Preventive care refers to lifestyle choices and visits with your health care provider that can promote health and wellness. What does preventive care include?  A yearly physical exam. This is also called an annual well check.  Dental exams once or twice a year.  Routine eye exams. Ask your health care provider how often you should have your eyes checked.  Personal lifestyle choices, including: ? Daily care of your teeth and gums. ? Regular physical activity. ? Eating a healthy diet. ? Avoiding tobacco and drug use. ? Limiting alcohol use. ? Practicing safe sex. ? Taking low doses of aspirin every day. ? Taking vitamin and mineral supplements as recommended by your health care provider. What happens during an annual well check? The services and screenings done by your health care provider during your annual well check will depend on your age, overall health, lifestyle risk factors, and family history of disease. Counseling Your health care provider may ask you questions about your:  Alcohol use.  Tobacco use.  Drug use.  Emotional well-being.  Home and relationship well-being.  Sexual activity.  Eating habits.  History of falls.  Memory and ability to understand (cognition).  Work and work Statistician.  Screening You may have the following tests or measurements:  Height, weight, and BMI.  Blood pressure.  Lipid and cholesterol levels. These may be checked every 5 years, or more frequently if you are over 26 years old.  Skin check.  Lung cancer screening. You may have this screening every year starting at age 35 if you have a 30-pack-year history of smoking and currently smoke or have quit within the past 15 years.  Fecal occult blood test (FOBT) of the stool. You may have  this test every year starting at age 90.  Flexible sigmoidoscopy or colonoscopy. You may have a sigmoidoscopy every 5 years or a colonoscopy every 10 years starting at age 43.  Prostate cancer screening. Recommendations will vary depending on your family history and other risks.  Hepatitis C blood test.  Hepatitis B blood test.  Sexually transmitted disease (STD) testing.  Diabetes screening. This is done by checking your blood sugar (glucose) after you have not eaten for a while (fasting). You may have this done every 1-3 years.  Abdominal aortic aneurysm (AAA) screening. You may need this if you are a current or former smoker.  Osteoporosis. You may be screened starting at age 42 if you are at high risk.  Talk with your health care provider about your test results, treatment options, and if necessary, the need for more tests. Vaccines Your health care provider may recommend certain vaccines, such as:  Influenza vaccine. This is recommended every year.  Tetanus, diphtheria, and acellular pertussis (Tdap, Td) vaccine. You may need a Td booster every 10 years.  Varicella vaccine. You may need this if you have not been vaccinated.  Zoster vaccine. You may need this after age 1.  Measles, mumps, and rubella (MMR) vaccine. You may need at least one dose of MMR if you were born in 1957 or later. You may also need a second dose.  Pneumococcal 13-valent conjugate (PCV13) vaccine. One dose is recommended after age 56.  Pneumococcal polysaccharide (PPSV23) vaccine. One dose is recommended after age 70.  Meningococcal vaccine. You may  need this if you have certain conditions.  Hepatitis A vaccine. You may need this if you have certain conditions or if you travel or work in places where you may be exposed to hepatitis A.  Hepatitis B vaccine. You may need this if you have certain conditions or if you travel or work in places where you may be exposed to hepatitis B.  Haemophilus  influenzae type b (Hib) vaccine. You may need this if you have certain risk factors.  Talk to your health care provider about which screenings and vaccines you need and how often you need them. This information is not intended to replace advice given to you by your health care provider. Make sure you discuss any questions you have with your health care provider. Document Released: 09/14/2015 Document Revised: 05/07/2016 Document Reviewed: 06/19/2015 Elsevier Interactive Patient Education  2017 [employer].

## 2017-04-29 NOTE — Progress Notes (Signed)
Patient: [name], Male    DOB: [birth year], 83 y.o.   MRN: [id number] Visit Date: 04/29/2017  Today's Provider: Lelon Huh, MD   Chief Complaint  Patient presents with  . Medicare Wellness  . Hypertension  . Hyperlipidemia  . Hypothyroidism  . Gastroesophageal Reflux   Subjective:    Annual wellness visit [name] is a 83 y.o. male. He feels well. He reports exercising yes/some light exercises. He reports he is sleeping well.  -----------------------------------------------------------    Hypertension, follow-up:  BP Readings from Last 3 Encounters:  04/29/17 130/80  04/15/17 111/68  03/05/17 (!) 148/68    He was last seen for hypertension 1 years ago.  BP at that visit was 128/78. Management since that visit includes; no changes.He reports good compliance with treatment. He is not having side effects. none He is exercising. He is adherent to low salt diet.   Outside blood pressures are 120/80. He is experiencing none.  Patient denies none.   Cardiovascular risk factors include advanced age (older than 71 for men, 59 for women).  Use of agents associated with hypertension: none.   ----------------------------------------------------------------     Lipid/Cholesterol, Follow-up:   Last seen for this 1 years ago.  Management since that visit includes; no changes.  Last Lipid Panel:    Component Value Date/Time   CHOL 124 12/20/2015 0803   TRIG 132 12/20/2015 0803   HDL 35 (L) 12/20/2015 0803   CHOLHDL 3.4 03/15/2015 0940   LDLCALC 63 12/20/2015 0803    He reports good compliance with treatment. He is not having side effects. none  Wt Readings from Last 3 Encounters:  04/29/17 226 lb (102.5 kg)  04/15/17 220 lb (99.8 kg)  03/05/17 215 lb (97.5 kg)    ----------------------------------------------------------------  Hypothyroidism, unspecified hypothyroidism type From 04/25/2016-no changes.  Lab Results  Component  Value Date   TSH 2.843 06/09/2016    Gastroesophageal reflux disease, esophagitis presence not specified From 04/25/2016-no changes. Controlled on Dexilant.  Currently having no symptoms   Follow up OSA Is using CPAP every night and working well  He states he has had trouble with balance and gait, has had three falls in the last year.   Review of Systems  Constitutional: Negative for appetite change, fatigue and fever.  HENT: Positive for drooling and hearing loss.   Respiratory: Negative for cough, chest tightness, shortness of breath and wheezing.   Cardiovascular: Negative for chest pain, palpitations and leg swelling.  Gastrointestinal: Negative for abdominal pain, blood in stool, constipation and diarrhea.  Endocrine: Positive for polyuria.  Neurological: Positive for dizziness. Negative for tremors, speech difficulty, light-headedness and numbness.  Psychiatric/Behavioral: Negative for behavioral problems, confusion, self-injury and sleep disturbance. The patient is not nervous/anxious.   All other systems reviewed and are negative.   Social History   Social History  . Marital status: Widowed    Spouse name: N/A  . Number of children: N/A  . Years of education: N/A   Occupational History  . Not on file.   Social History Main Topics  . Smoking status: Former Smoker    Types: Cigarettes    Quit date: 08/04/1997  . Smokeless tobacco: Never Used  . Alcohol use No  . Drug use: No  . Sexual activity: Not on file   Other Topics Concern  . Not on file   Social History Narrative   He is a widowed father of one, grandfather of 77 with 3  stepchildren. He has been working out vigorously at [employer] the basis. He's up to doing 20 minutes on the stationary bike and 10-15 minutes on the treadmill. He doesn't smoke or drink.    Past Medical History:  Diagnosis Date  . Arthritis   . Basal cell carcinoma of eyelid 01/03/2013  . Cancer [facility])    Skin cancer  . Colon polyps   .  COPD (chronic obstructive pulmonary disease) ([place])   . Coronary artery disease   . Dysrhythmia    afib  . GERD (gastroesophageal reflux disease)   . Hemorrhoid 02/10/2015  . History of hiatal hernia   . HOH (hard of hearing)   . Hypertension   . Memory loss   . Osteoporosis   . Prostate disorder   . Sciatic pain    Chronic  . Sleep apnea    cpap  . Temporary cerebral vascular dysfunction 02/10/2015   Had negative work-up.  July, 2012.  No medication changes, had forgot them that day, got dehydrated.      Patient Active Problem List   Diagnosis Date Noted  . Senile purpura ([place]) 04/29/2017  . Anemia 04/29/2017  . Benign localized hyperplasia of prostate with urinary obstruction 07/15/2016  . Elevated PSA 07/15/2016  . Incomplete emptying of bladder 07/15/2016  . Urge incontinence 07/15/2016  . Hypothyroidism 04/25/2016  . Macular degeneration 04/25/2016  . Erectile dysfunction due to arterial insufficiency   . Ischemic heart disease with chronotropic incompetence   . CN (constipation) 02/10/2015  . DD (diverticular disease) 02/10/2015  . Accumulation of fluid in tissues 02/10/2015  . Elevated WBC count 02/10/2015  . Fatigue 02/10/2015  . Hypoxia 02/10/2015  . Lichen planus 15/17/6160  . Restless leg 02/10/2015  . 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 B12 deficiency 02/10/2015  . SOB (shortness of breath) 12/14/2014  . Lower extremity edema 12/14/2014  . COPD (chronic obstructive pulmonary disease) ([place]) 11/14/2014  . Post Inflammatory Lung Changes 11/14/2014  . Confusion state 11/08/2014  . Amnesia 11/08/2014  . PAF (paroxysmal atrial fibrillation) (HCC) CHA2DS2-VASc = 4. AC = Eliquis   . Insomnia 12/09/2013  . OSA on CPAP   . Erectile dysfunction   . Dyspnea on exertion - -essentially resolved with BB dose reduction & wgt loss 03/29/2013  . Overweight (BMI 25.0-29.9) -- Wgt back up 01/17/2013  . Atherosclerotic heart disease of native coronary artery without  angina pectoris - s/p CABG, occluded SVG-D1; patent LIMA-LAD, SVG-OM, SVG- RCA   . Essential hypertension   . Hyperlipidemia with target LDL less than 70   . Aorto-iliac disease ([place])   . BCC (basal cell carcinoma), eyelid 01/03/2013  . Allergic rhinitis 08/17/2009  . Arthralgia of ankle or foot 06/04/2009  . Adaptation reaction 05/28/2009  . Benign prostatic hypertrophy without urinary obstruction 05/28/2009  . Acid reflux 05/28/2009  . Arthritis, degenerative 05/28/2009  . 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 Carotid artery occlusion without infarction 06/01/2006  . PAD (peripheral artery disease) - bilateral common iliac stents 12/15/2005  . Aortoiliac occlusive disease ([place]) 11/30/2005  . Atherosclerotic heart disease of artery bypass graft 09/01/1997    Past Surgical History:  Procedure Laterality Date  . ANKLE SURGERY    . Arch aortogram and carotid aortogram  06/02/2006   Dr Oneida Alar did surgery  . BASAL CELL CARCINOMA EXCISION  04/2016   Dermatology  . CARDIAC CATHETERIZATION  01/30/1998     total native LAD andRCA ,mod native CIRC; svg to RCA and  OBTUSE MARG. PATENT the left internal mammary artery graft to LAD ; LV NORMAL  /  . CAROTID ENDARTERECTOMY Left Oct. 29, 2007   Dr. Oneida Alar:  . CATARACT EXTRACTION W/PHACO Right 03/05/2017   Procedure: CATARACT EXTRACTION PHACO AND INTRAOCULAR LENS PLACEMENT (IOC);  Surgeon: Leandrew Koyanagi, MD;  Location: [facility] ORS;  Service: Ophthalmology;  Laterality: Right;  [place] 00:58.5 AP% 10.6 CDE 6.20 Fluid Pack lot # [id number]  . CATARACT EXTRACTION W/PHACO Left 04/15/2017   Procedure: CATARACT EXTRACTION PHACO AND INTRAOCULAR LENS PLACEMENT ([place])  Left;  Surgeon: Leandrew Koyanagi, MD;  Location: [place];  Service: Ophthalmology;  Laterality: Left;  . CORONARY ANGIOPLASTY     stent  . CORONARY ARTERY BYPASS GRAFT  1998   LIMA-LAD, SVG-OM, SVG-RPDA, SVG-DIAG  . CPET / MET  12/2012   Mild chronotropic incompetence  - read 82% of predicted; also reduced effort; peak VO2 15.7 / 75% (did not reach Max effort) -- suggested ischemic response in last 1.5 minutes of exercise. Normal pulmonary function on PFTs but poor response to  . DOPPLER ECHOCARDIOGRAPHY  11/20/2010   EF =>55%; LV norm mild aortic scelorosis  . FRACTURE SURGERY Right 03/19/2015   wrist  . HIATAL HERNIA REPAIR    . ILIAC ARTERY STENT  12/15/2005   PTA and direct stenting rgt and lft common iliac arteries  . NM MYOCAR PERF WALL MOTION  11/11/2010; February 2016   a) TM STRESS----NORMAL PERFUSION ,EF 68%; b) EF 50-55%. Normal LV function. No significant ischemia or infarction.  . ORIF ELBOW FRACTURE Right 01/01/2017   Procedure: OPEN REDUCTION INTERNAL FIXATION (ORIF) ELBOW/OLECRANON FRACTURE;  Surgeon: Hessie Knows, MD;  Location: [facility] ORS;  Service: Orthopedics;  Laterality: Right;  . ORIF WRIST FRACTURE Right 03/19/2015   Procedure: OPEN REDUCTION INTERNAL FIXATION (ORIF) WRIST FRACTURE;  Surgeon: Hessie Knows, MD;  Location: [facility] ORS;  Service: Orthopedics;  Laterality: Right;  . THORACIC AORTA - CAROTID ANGIOGRAM  October 2007   Dr. 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 TRANSTHORACIC ECHOCARDIOGRAM  February 2016   [facility]: Normal LV function. Dilated left atrium.    His family history includes Alcohol abuse in his father; Dementia in his mother; Ulcers in his mother.      Current Outpatient Prescriptions:  .  acetaminophen (TYLENOL) 500 MG tablet, Take 500-1,000 mg by mouth every 6 (six) hours as needed (for pain.)., Disp: , Rfl:  .  apixaban (ELIQUIS) 5 MG TABS tablet, Take 5 mg by mouth 2 (two) times daily. , Disp: , Rfl:  .  Ascorbic Acid (VITAMIN C) 1000 MG tablet, Take 1,000 mg by mouth daily., Disp: , Rfl:  .  Cholecalciferol (VITAMIN D-3) 5000 units TABS, Take 5,000 Units by mouth every evening., Disp: , Rfl:  .  Cyanocobalamin (VITAMIN B-12)  3000 MCG SUBL, Place 3,000 mcg under the tongue daily., Disp: , Rfl:  .  dexlansoprazole (DEXILANT) 60 MG capsule, Take 1 capsule (60 mg total) by mouth daily., Disp: 30 capsule, Rfl: 11 .  ezetimibe (ZETIA) 10 MG tablet, TAKE ONE TABLET EVERY DAY, Disp: 90 tablet, Rfl: 4 .  finasteride (PROSCAR) 5 MG tablet, Take 5 mg by mouth daily., Disp: , Rfl:  .  fluticasone (FLONASE) 50 MCG/ACT nasal spray, Place 2 sprays into both nostrils daily. (Patient not taking: Reported on 03/05/2017), Disp: 16 g, Rfl: 6 .  Fluticasone-Salmeterol (ADVAIR DISKUS) 250-50 MCG/DOSE AEPB, Inhale 1 puff into the lungs  2 (two) times daily., Disp: 3 each, Rfl: 3 .  ipratropium-albuterol (DUONEB) 0.5-2.5 (3) MG/3ML SOLN, Inhale 3 mLs into the lungs every 4 (four) hours as needed for shortness of breath or wheezing., Disp: , Rfl: 1 .  lisinopril (PRINIVIL,ZESTRIL) 2.5 MG tablet, TAKE ONE TABLET BY MOUTH TWICE DAILY, Disp: 180 tablet, Rfl: 3 .  magnesium oxide (MAG-OX) 400 MG tablet, Take 400 mg by mouth every evening. , Disp: , Rfl:  .  metoprolol tartrate (LOPRESSOR) 25 MG tablet, TAKE 1 TABLET BY MOUTH TWICE DAILY, Disp: 180 tablet, Rfl: 0 .  mirabegron ER (MYRBETRIQ) 25 MG TB24 tablet, Take 25 mg by mouth every morning. , Disp: , Rfl:  .  montelukast (SINGULAIR) 10 MG tablet, Take 1 tablet (10 mg total) by mouth at bedtime., Disp: 30 tablet, Rfl: 3 .  Multiple Vitamins-Minerals (PRESERVISION AREDS 2 PO), Take 1 tablet by mouth 2 (two) times daily., Disp: , Rfl:  .  nitroGLYCERIN (NITROSTAT) 0.4 MG SL tablet, Place 1 tablet (0.4 mg total) under the tongue every 5 (five) minutes as needed for chest pain., Disp: 20 tablet, Rfl: 1 .  Polyethyl Glycol-Propyl Glycol (SYSTANE) 0.4-0.3 % SOLN, Place 1 drop into both eyes 3 (three) times daily as needed (FOR DRY EYES)., Disp: , Rfl:  .  pramipexole (MIRAPEX) 0.125 MG tablet, Take 1 tablet (0.125 mg total) by mouth at bedtime. (Patient not taking: Reported on 04/02/2017), Disp: 30 tablet,  Rfl: 2 .  rosuvastatin (CRESTOR) 20 MG tablet, TAKE ONE TABLET BY MOUTH EVERY DAY (Patient taking differently: TAKE ONE TABLET BY MOUTH EVERY DAY IN am), Disp: 90 tablet, Rfl: 3 .  tamsulosin (FLOMAX) 0.4 MG CAPS capsule, Take 0.4 mg by mouth daily., Disp: , Rfl:   Patient Care Team: [name], MD as PCP - General (Family Medicine) Leonie Man, MD as Consulting Physician (Cardiology) Dasher, Rayvon Char, MD (Dermatology) Nickel, Sharmon Leyden, NP as Nurse Practitioner (Vascular Surgery) Laverle Hobby, MD as Consulting Physician (Pulmonary Disease) Hessie Knows, MD as Consulting Physician (Orthopedic Surgery) Murrell Redden, MD (Urology)     Objective:   Vitals: BP 130/80 (BP Location: Right Arm, Patient Position: Sitting, Cuff Size: Large)   Pulse 73   Temp 98.3 F (36.8 C) (Oral)   Resp 16   Ht 6' (1.829 m)   Wt 226 lb (102.5 kg)   SpO2 96%   BMI 30.65 kg/m   Physical Exam  Activities of Daily Living In your present state of health, do you have any difficulty performing the following activities: 04/29/2017 04/15/2017  Hearing? Tempie Donning  Vision? N N  Difficulty concentrating or making decisions? Y N  Walking or climbing stairs? N N  Dressing or bathing? N N  Comment - -  Doing errands, shopping? N -  Some recent data might be hidden    Fall Risk Assessment Fall Risk  04/29/2017 04/25/2016 04/24/2015  Falls in the past year? Yes Yes Yes  Number falls in past yr: 2 or more 2 or more 1  Comment 3 falls - -  Injury with Fall? Yes Yes Yes  Comment broke right wrist & broke right elbow - -  Risk Factor Category  High Fall Risk - -  Risk for fall due to : - Other (Comment) -  Follow up - Falls prevention discussed -     Depression Screen PHQ 2/9 Scores 04/29/2017 04/25/2016 04/24/2015  PHQ - 2 Score 0 0 0  PHQ- 9 Score 0 - -  Cognitive Testing - 6-CIT  Correct? Score   What year is it? yes 0 0 or 4  What month is it? yes 0 0 or 3  Memorize:    Pia Mau,   44,  [place],      What time is it? (within 1 hour) yes 0 0 or 3  Count backwards from 20 yes 0 0, 2, or 4  Name the months of the year yes 0 0, 2, or 4  Repeat name & address above yes 4 0, 2, 4, 6, 8, or 10       TOTAL SCORE  4/28   Interpretation:  Normal  Normal (0-7) Abnormal (8-28)    Audit-C Alcohol Use Screening  Question Answer Points  How often do you have alcoholic drink? never 0  On days you do drink alcohol, how many drinks do you typically consume? n/a 0  How oftey will you drink 6 or more in a total? never 0  Total Score:  0   A score of 3 or more in women, and 4 or more in men indicates increased risk for alcohol abuse, EXCEPT if all of the points are from question 1.     Assessment & Plan:     Annual Wellness Visit  Reviewed patient's Family Medical History Reviewed and updated list of patient's medical providers Assessment of cognitive impairment was done Assessed patient's functional ability Established a written schedule for health screening [place] Completed and Reviewed  Exercise Activities and Dietary recommendations Goals    . Exercise 150 minutes per week (moderate activity)       Immunization History  Administered Date(s) Administered  . Influenza, High Dose Seasonal PF 07/28/2016  . Influenza,inj,Quad PF,6+ Mos 06/21/2015  . Influenza-Unspecified 07/02/2013, 06/15/2014  . Pneumococcal Conjugate-13 11/07/2013  . Pneumococcal-Unspecified 06/15/2010  . Td 03/18/2011    Health Maintenance  Topic Date Due  . INFLUENZA VACCINE  05/30/2017 (Originally 04/01/2017)  . TETANUS/TDAP  03/17/2021  . PNA vac Low Risk Adult  Completed     Discussed health benefits of physical activity, and encouraged him to engage in regular exercise appropriate for his age and condition.    --------------------------------------------------------------------------  1. Medicare annual wellness visit, subsequent   2.  Essential hypertension Well controlled.  Continue current medications.    3. Atherosclerosis of native coronary artery of native heart without angina pectoris Asymptomatic. Compliant with medication.  Continue aggressive risk factor modification.  Continue regular follow up with cardiology   4. Senile purpura (HCC) Stable   5. PAF (paroxysmal atrial fibrillation) (HCC) Regular rhythm today. Continue regular follow up cardiology  6. Need for influenza vaccination He plans on getting from pharmacist with his girlfriend.   7. OSA on CPAP Is using CPAP consistently and effective for improving energy and QOL  8. Hypothyroidism, unspecified type  - T4 AND TSH  9. Hyperlipidemia with target LDL less than 70 He is tolerating rosuvastatin well with no adverse effects.   - Comprehensive metabolic panel - Lipid panel  10. B12 deficiency  - Vitamin B12  11. Anemia due to vitamin B12 deficiency, unspecified B12 deficiency type  - Vitamin B12 - CBC  12. Frequent falls His last fall was in April and he feels that trouble with vision and glasses contributed to poor balance. Has since had cataracts extracted both eyes and is getting new glasses. If he has any more problems he states he will contact me for PT referral.  Lelon Huh, MD  [facility]

## 2017-04-30 DIAGNOSIS — E538 Deficiency of other specified B group vitamins: Secondary | ICD-10-CM | POA: Diagnosis not present

## 2017-04-30 DIAGNOSIS — E785 Hyperlipidemia, unspecified: Secondary | ICD-10-CM | POA: Diagnosis not present

## 2017-04-30 DIAGNOSIS — E039 Hypothyroidism, unspecified: Secondary | ICD-10-CM | POA: Diagnosis not present

## 2017-04-30 DIAGNOSIS — D519 Vitamin B12 deficiency anemia, unspecified: Secondary | ICD-10-CM | POA: Diagnosis not present

## 2017-05-01 ENCOUNTER — Telehealth: Payer: Self-pay

## 2017-05-01 LAB — COMPREHENSIVE METABOLIC PANEL
ALT: 16 IU/L (ref 0–44)
AST: 25 IU/L (ref 0–40)
Albumin/Globulin Ratio: 2 (ref 1.2–2.2)
Albumin: 4.5 g/dL (ref 3.5–4.7)
Alkaline Phosphatase: 96 IU/L (ref 39–117)
BUN/Creatinine Ratio: 12 (ref 10–24)
BUN: 12 mg/dL (ref 8–27)
Bilirubin Total: 1 mg/dL (ref 0.0–1.2)
CO2: 21 mmol/L (ref 20–29)
Calcium: 9.4 mg/dL (ref 8.6–10.2)
Chloride: 107 mmol/L — ABNORMAL HIGH (ref 96–106)
Creatinine, Ser: 1.02 mg/dL (ref 0.76–1.27)
GFR calc Af Amer: 79 mL/min/{1.73_m2} (ref >59)
GFR calc non Af Amer: 69 mL/min/{1.73_m2} (ref >59)
Globulin, Total: 2.3 g/dL (ref 1.5–4.5)
Glucose: 92 mg/dL (ref 65–99)
Potassium: 4.2 mmol/L (ref 3.5–5.2)
Sodium: 145 mmol/L — ABNORMAL HIGH (ref 134–144)
Total Protein: 6.8 g/dL (ref 6.0–8.5)

## 2017-05-01 LAB — CBC
Hematocrit: 42.6 % (ref 37.5–51.0)
Hemoglobin: 14 g/dL (ref 13.0–17.7)
MCH: 30.2 pg (ref 26.6–33.0)
MCHC: 32.9 g/dL (ref 31.5–35.7)
MCV: 92 fL (ref 79–97)
Platelets: 162 10*3/uL (ref 150–379)
RBC: 4.64 x10E6/uL (ref 4.14–5.80)
RDW: 15.6 % — ABNORMAL HIGH (ref 12.3–15.4)
WBC: 9 10*3/uL (ref 3.4–10.8)

## 2017-05-01 LAB — LIPID PANEL
Chol/HDL Ratio: 3 ratio (ref 0.0–5.0)
Cholesterol, Total: 118 mg/dL (ref 100–199)
HDL: 40 mg/dL (ref >39)
LDL Calculated: 54 mg/dL (ref 0–99)
Triglycerides: 122 mg/dL (ref 0–149)
VLDL Cholesterol Cal: 24 mg/dL (ref 5–40)

## 2017-05-01 LAB — VITAMIN B12: Vitamin B-12: 959 pg/mL (ref 232–1245)

## 2017-05-01 LAB — T4 AND TSH
T4, Total: 7.9 ug/dL (ref 4.5–12.0)
TSH: 3.82 u[IU]/mL (ref 0.450–4.500)

## 2017-05-01 NOTE — Telephone Encounter (Signed)
LMTCB 05/01/2017  Thanks,   -Laura  

## 2017-05-01 NOTE — Telephone Encounter (Signed)
-----   Message from [name], MD sent at 05/01/2017  8:13 AM EDT ----- Labs are all good. Cholesterol controlled. Continue current medications.

## 2017-05-14 ENCOUNTER — Other Ambulatory Visit: Payer: Self-pay | Admitting: Cardiology

## 2017-05-20 ENCOUNTER — Ambulatory Visit (INDEPENDENT_AMBULATORY_CARE_PROVIDER_SITE_OTHER): Payer: Medicare Other | Admitting: Cardiology

## 2017-05-20 ENCOUNTER — Encounter: Payer: Self-pay | Admitting: Cardiology

## 2017-05-20 VITALS — BP 152/82 | HR 68 | Ht 72.0 in | Wt 224.4 lb

## 2017-05-20 DIAGNOSIS — I480 Paroxysmal atrial fibrillation: Secondary | ICD-10-CM

## 2017-05-20 DIAGNOSIS — I779 Disorder of arteries and arterioles, unspecified: Secondary | ICD-10-CM | POA: Diagnosis not present

## 2017-05-20 DIAGNOSIS — R0609 Other forms of dyspnea: Secondary | ICD-10-CM

## 2017-05-20 DIAGNOSIS — E663 Overweight: Secondary | ICD-10-CM | POA: Diagnosis not present

## 2017-05-20 DIAGNOSIS — I209 Angina pectoris, unspecified: Secondary | ICD-10-CM | POA: Diagnosis not present

## 2017-05-20 DIAGNOSIS — I25708 Atherosclerosis of coronary artery bypass graft(s), unspecified, with other forms of angina pectoris: Secondary | ICD-10-CM | POA: Diagnosis not present

## 2017-05-20 DIAGNOSIS — I2589 Other forms of chronic ischemic heart disease: Secondary | ICD-10-CM | POA: Diagnosis not present

## 2017-05-20 DIAGNOSIS — R0600 Dyspnea, unspecified: Secondary | ICD-10-CM

## 2017-05-20 DIAGNOSIS — E785 Hyperlipidemia, unspecified: Secondary | ICD-10-CM | POA: Diagnosis not present

## 2017-05-20 DIAGNOSIS — I2510 Atherosclerotic heart disease of native coronary artery without angina pectoris: Secondary | ICD-10-CM | POA: Diagnosis not present

## 2017-05-20 DIAGNOSIS — I10 Essential (primary) hypertension: Secondary | ICD-10-CM

## 2017-05-20 NOTE — Progress Notes (Signed)
PCP: [name], MD  Clinic Note: Chief Complaint  Patient presents with  . Follow-up    12 months; Pt states no Sx.   . Coronary Artery Disease  . Atrial Fibrillation    HPI: [name] is a 83 y.o. male with a PMH below who presents today for delayed annual f/u. CAD and PAF.[name] PMH notable for CAD s/p CABGx4 in 1999, carotid artery occlusion s/p L CEA, ischemic heart disease with chronotropic incompetence, HLD, HTN, OSA on CPAP, aortoiliac occlusive disease s/p common iliac artery stenting 2007. Now has Afib - currently on Eliquis  [name] was last seen on 03/19/2016 -he was doing very well. He noticed having some balance issues. He also was given a more short of breath than usual    Recent Hospitalizations: Right elbow surgery after a fall while in [place] earlier this summer. Also bilateral cataract surgery   Studies Personally Reviewed - (if available, images/films reviewed: From Epic Chart or Care Everywhere)  Carotid artery Dopplers February 2018: Patent left CEA with stenosis. Stable 40 and 59% R ICA no evidence of significant bilateral posterior artery disease.   Bilateral ABIs: R0.96, L0.99, TBIs are 0.70, L0.62. Both normal   Patent R common iliac with no stenosis. L common iliac stent - patent with roughly 50% stenosis due to increased velocities but no visible plaque.   Interval History: [name] presents today with his significant other stable he's doing fairly well. No major issues. He does have some mild lower cavity edema. He also has times when he is just really tired and fatigued in the day. He is very active [name] once edema course a day. Never stopped breast during the course of the day, just tends to "conk out"  The today. He does have some mild popping his lower extremity is, no significant edema or PND, orthopnea.    He may note some intermittent flip flopping his chest, nothing to suggest any recurrence of A. fib. No bleeding issues on  and regulation. No palpitations, lightheadedness, dizziness, weakness or syncope/near syncope. No TIA/amaurosis fugax symptoms. No melena, hematochezia, hematuria, or epstaxis. No claudication.  ROS: A comprehensive was performed. Review of Systems  Constitutional: Negative for malaise/fatigue (Just more sleepy during the day.).  HENT: Positive for hearing loss. Negative for congestion and sinus pain.   Respiratory: Negative for cough and shortness of breath.   Musculoskeletal: Positive for joint pain (Knees hips and back). Negative for falls and myalgias.  Endo/Heme/Allergies: Bruises/bleeds easily.  Psychiatric/Behavioral: Negative for depression (Some features would suggest possible dysthymia) and memory loss.  All other systems reviewed and are negative.  I have reviewed and (if needed) personally updated the patient's problem list, medications, allergies, past medical and surgical history, social and family history.   Past Medical History:  Diagnosis Date  . Arthritis   . Basal cell carcinoma of eyelid 01/03/2013  . Cancer [facility])    Skin cancer  . Colon polyps   . COPD (chronic obstructive pulmonary disease) ([place])   . Coronary artery disease   . Dysrhythmia    afib  . GERD (gastroesophageal reflux disease)   . Hemorrhoid 02/10/2015  . History of hiatal hernia   . HOH (hard of hearing)   . Hypertension   . Memory loss   . Osteoporosis   . Prostate disorder   . Sciatic pain    Chronic  . Sleep apnea    cpap  . Temporary cerebral vascular dysfunction 02/10/2015  Had negative work-up.  July, 2012.  No medication changes, had forgot them that day, got dehydrated.     Past Surgical History:  Procedure Laterality Date  . ANKLE SURGERY    . Arch aortogram and carotid aortogram  06/02/2006   Dr Oneida Alar did surgery  . BASAL CELL CARCINOMA EXCISION  04/2016   Dermatology  . CARDIAC CATHETERIZATION  01/30/1998     total native LAD andRCA ,mod native CIRC; svg to RCA and  OBTUSE MARG. PATENT the left internal mammary artery graft to LAD ; LV NORMAL  /  . CAROTID ENDARTERECTOMY Left Oct. 29, 2007   Dr. Oneida Alar:  . CATARACT EXTRACTION W/PHACO Right 03/05/2017   Procedure: CATARACT EXTRACTION PHACO AND INTRAOCULAR LENS PLACEMENT (IOC);  Surgeon: Leandrew Koyanagi, MD;  Location: [facility] ORS;  Service: Ophthalmology;  Laterality: Right;  [place] 00:58.5 AP% 10.6 CDE 6.20 Fluid Pack lot # [id number]  . CATARACT EXTRACTION W/PHACO Left 04/15/2017   Procedure: CATARACT EXTRACTION PHACO AND INTRAOCULAR LENS PLACEMENT ([place])  Left;  Surgeon: Leandrew Koyanagi, MD;  Location: [place];  Service: Ophthalmology;  Laterality: Left;  . CORONARY ANGIOPLASTY     stent  . CORONARY ARTERY BYPASS GRAFT  1998   LIMA-LAD, SVG-OM, SVG-RPDA, SVG-DIAG  . CPET / MET  12/2012   Mild chronotropic incompetence - read 82% of predicted; also reduced effort; peak VO2 15.7 / 75% (did not reach Max effort) -- suggested ischemic response in last 1.5 minutes of exercise. Normal pulmonary function on PFTs but poor response to  . DOPPLER ECHOCARDIOGRAPHY  11/20/2010   EF =>55%; LV norm mild aortic scelorosis  . FRACTURE SURGERY Right 03/19/2015   wrist  . HIATAL HERNIA REPAIR    . ILIAC ARTERY STENT  12/15/2005   PTA and direct stenting rgt and lft common iliac arteries  . NM MYOCAR PERF WALL MOTION  11/11/2010; February 2016   a) TM STRESS----NORMAL PERFUSION ,EF 68%; b) EF 50-55%. Normal LV function. No significant ischemia or infarction.  . ORIF ELBOW FRACTURE Right 01/01/2017   Procedure: OPEN REDUCTION INTERNAL FIXATION (ORIF) ELBOW/OLECRANON FRACTURE;  Surgeon: Hessie Knows, MD;  Location: [facility] OR[facility];  Service: Orthopedics;  Laterality: Right;  . ORIF WRIST FRACTURE Right 03/19/2015   Procedure: OPEN REDUCTION INTERNAL FIXATION (ORIF) WRIST FRACTURE;  Surgeon: Hessie Knows, MD;  Location: [facility] OR[facility];  Service: Orthopedics;  Laterality: Right;  . THORACIC AORTA - CAROTID ANGIOGRAM  October  2007   Dr. 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 TRANSTHORACIC ECHOCARDIOGRAM  February 2016   [facility]: Normal LV function. Dilated left atrium.    Current Meds  Medication Sig  . acetaminophen (TYLENOL) 500 MG tablet Take 500-1,000 mg by mouth every 6 (six) hours as needed (for pain.).  [name] apixaban (ELIQUIS) 5 MG TABS tablet Take 5 mg by mouth 2 (two) times daily.   . Ascorbic Acid (VITAMIN C) 1000 MG tablet Take 1,000 mg by mouth daily.  . Cholecalciferol (VITAMIN D-3) 5000 units TABS Take 5,000 Units by mouth every evening.  . Cyanocobalamin (VITAMIN B-12) 3000 MCG SUBL Place 3,000 mcg under the tongue daily.  [name] dexlansoprazole (DEXILANT) 60 MG capsule Take 1 capsule (60 mg total) by mouth daily.  [name] ezetimibe (ZETIA) 10 MG tablet TAKE ONE TABLET EVERY DAY  . finasteride (PROSCAR) 5 MG tablet Take 5 mg by mouth daily.  . fluticasone (FLONASE) 50 MCG/ACT nasal spray Place 2 sprays into both nostrils  daily.  . Fluticasone-Salmeterol (ADVAIR DISKUS) 250-50 MCG/DOSE AEPB Inhale 1 puff into the lungs 2 (two) times daily.  [name] ipratropium-albuterol (DUONEB) 0.5-2.5 (3) MG/3ML SOLN Inhale 3 mLs into the lungs every 4 (four) hours as needed for shortness of breath or wheezing.  [name] lisinopril (PRINIVIL,ZESTRIL) 2.5 MG tablet TAKE ONE TABLET BY MOUTH TWICE DAILY  . magnesium oxide (MAG-OX) 400 MG tablet Take 400 mg by mouth every evening.   . metoprolol tartrate (LOPRESSOR) 25 MG tablet TAKE ONE TABLET TWICE DAILY  . mirabegron ER (MYRBETRIQ) 25 MG TB24 tablet Take 25 mg by mouth every morning.   . Multiple Vitamins-Minerals (PRESERVISION AREDS 2 PO) Take 1 tablet by mouth 2 (two) times daily.  . nitroGLYCERIN (NITROSTAT) 0.4 MG SL tablet Place 1 tablet (0.4 mg total) under the tongue every 5 (five) minutes as needed for chest pain.  [name] Glycol-Propyl Glycol (SYSTANE) 0.4-0.3 % SOLN Place 1 drop into both  eyes 3 (three) times daily as needed (FOR DRY EYES).  . rosuvastatin (CRESTOR) 20 MG tablet TAKE ONE TABLET BY MOUTH EVERY DAY (Patient taking differently: TAKE ONE TABLET BY MOUTH EVERY DAY IN am)  . tamsulosin (FLOMAX) 0.4 MG CAPS capsule Take 0.4 mg by mouth daily.  . [DISCONTINUED] montelukast (SINGULAIR) 10 MG tablet Take 1 tablet (10 mg total) by mouth at bedtime.  . [DISCONTINUED] pramipexole (MIRAPEX) 0.125 MG tablet Take 1 tablet (0.125 mg total) by mouth at bedtime.    Allergies  Allergen Reactions  . Clonazepam Other (See Comments)    Altered mental status  . Codeine Other (See Comments)    Altered mental status.  . Niacin And Related Other (See Comments)    Flushing of skin  . Tramadol     Other reaction(s): Hallucination  . Hydrocodone     confusion  . Oxycodone Other (See Comments)    confusion confusion    Social History   Social History  . Marital status: Widowed    Spouse name: N/A  . Number of children: N/A  . Years of education: N/A   Social History Main Topics  . Smoking status: Former Smoker    Types: Cigarettes    Quit date: 08/04/1997  . Smokeless tobacco: Never Used  . Alcohol use No  . Drug use: No  . Sexual activity: Not Asked   Other Topics Concern  . None   Social History Narrative   He is a widowed father of one, grandfather of 70 with 3 stepchildren. He has been working out vigorously at [employer] the basis. He's up to doing 20 minutes on the stationary bike and 10-15 minutes on the treadmill. He doesn't smoke or drink.    family history includes Alcohol abuse in his father; Dementia in his mother; Ulcers in his mother.  Wt Readings from Last 3 Encounters:  05/20/17 224 lb 6.4 oz (101.8 kg)  04/29/17 226 lb (102.5 kg)  04/15/17 220 lb (99.8 kg)    PHYSICAL EXAM BP (!) 152/82   Pulse 68   Ht 6' (1.829 m)   Wt 224 lb 6.4 oz (101.8 kg)   BMI 30.43 kg/m  - he indicates that at home his blood pressure usually 130 to 140 mmHg.    Physical Exam  Constitutional: He appears well-developed and well-nourished. No distress.  Appears his stated age, well groomed. Stable.  Neck: Normal range of motion. Neck supple. Carotid bruit is present (right).  Cardiovascular: Normal rate, regular rhythm, S1 normal and intact distal pulses.  Occasional extrasystoles are present. PMI is not displaced.  Exam reveals distant heart sounds. Exam reveals no gallop.   Murmur heard.  Medium-pitched harsh crescendo-decrescendo early systolic murmur is present with a grade of 1/6  at the upper right sternal border radiating to the neck, axilla Physiologically split S2 without S3 or S4 gallop.  Nursing note and vitals reviewed.    Adult ECG Report n/a  Other studies Reviewed: Additional studies/ records that were reviewed today include:  Recent Labs:    Lab Results  Component Value Date   CHOL 118 04/30/2017   HDL 40 04/30/2017   LDLCALC 54 04/30/2017   TRIG 122 04/30/2017   CHOLHDL 3.0 04/30/2017   Lab Results  Component Value Date   CREATININE 1.02 04/30/2017   BUN 12 04/30/2017   NA 145 (H) 04/30/2017   K 4.2 04/30/2017   CL 107 (H) 04/30/2017   CO2 21 04/30/2017    ASSESSMENT / PLAN: Problem List Items Addressed This Visit    Atherosclerotic heart disease of artery bypass graft    Occluded vein graft to the RCA and OM. The didn't see very much native circumflex disease, and the RCA was totally closed. See other CAD segment.      Atherosclerotic heart disease of native coronary artery without angina pectoris - s/p CABG, occluded SVG-D1; patent LIMA-LAD, SVG-OM, SVG- RCA - Primary (Chronic)    Doing fairly well with his current status of coronary artery disease. Last Myoview was in 2016. Nonischemic. Normal EF. He will be due for follow-up study in about 2 years, we can discuss if he would want to do follow-up or not.  Continue current dose of beta blocker and ACE inhibitor and Crestor post area. He is not on either  aspirin or Plavix, because he is taking Eliquis       Dyspnea on exertion - -essentially resolved with BB dose reduction & wgt loss (Chronic)    Doing better with lower dose beta blocker. Only notes some dyspnea with significant exertion.      Essential hypertension (Chronic)    As is usually the case, his blood pressure is higher here today the patient is elsewhere. No headaches, blurred vision or dizziness. He tells me at home his blood pressure usually in the 140 mmHg range. As such, I would not recommend further titrating his medications.      Hyperlipidemia with target LDL less than 70 (Chronic)    Most recently the panel looked pretty good. Pretty much at goal current dose of rosuvastatin      Ischemic heart disease with chronotropic incompetence   Overweight (BMI 25.0-29.9) -- Wgt back up (Chronic)    His ways been fluctuating up and down, no significant change. He continues to be active and is trying to do the best he can with exercising.      PAF (paroxysmal atrial fibrillation) (HCC) CHA2DS2-VASc = 4. AC = Eliquis (Chronic)    As far significantly is not any breakthrough episodes of A. fib. Continue current dose of beta blocker for rate control and Eliquis for anticoagulation. No bleeding issues         Current medicines are reviewed at length with the patient today. (+/- concerns) None The following changes have been made: None  Patient Instructions  NO CHANGE WITH MEDICATIONS     Your physician wants you to follow-up in [place].You will receive a reminder letter in the mail two months in advance. If you don't receive a  letter, please call our office to schedule the follow-up appointment.     If you need a refill on your cardiac medications before your next appointment, please call your pharmacy.    Studies Ordered:   No orders of the defined types were placed in this encounter.     [name], M.D., M.S. Interventional Cardiologist    Pager # [phone] Phone # [phone] [address] [place], [place] [address]

## 2017-05-20 NOTE — Patient Instructions (Signed)
NO CHANGE WITH MEDICATIONS     Your physician wants you to follow-up in [place].You will receive a reminder letter in the mail two months in advance. If you don't receive a letter, please call our office to schedule the follow-up appointment.     If you need a refill on your cardiac medications before your next appointment, please call your pharmacy.

## 2017-05-21 NOTE — Progress Notes (Signed)
* [name]     Assessment and Plan:  Severe Obstructive sleep apnea.  -Auto CPAP with pressure settings of 10-20 . At last visit, currently set at 8. -Residual AHI most recent download is 15.9, which is not ideal.  -Usage could be improved, average usage is 4 hours 57 minutes on days used. Apnea index is elevated now that the patient is on a lower pressure, we will need to increase the pressure.  Periodic Limb Movements in Sleep.  -Sleep study showed severe periodic limb movements, these persisted on the patient's titration study with an arousal index of 5. -ferritin level, 08/01/16; 46 (normal).   Insomnia. -improved, not on medication.   Interstitial lung disease. -Doubt NSIP, changes are likely related to underlying emphysema. No active treatment required at this time.  COPD/Emphysema.  -Significant emphysematous changes seen on CT chest. -Continue Advair. -The patient is encouraged to try to increase his physical activity level.  Lung nodule.  -Right upper lobe granuloma, calcified. No further follow-up needed.  Atrial fibrillation.  -Currently on Eliquis for atrial fibrillation, continue to follow with cardiology.   Weakness/deconditioning -As above secondary to multiple comorbidities, patient has to try to increase his physical activity level in a safe manner.  Date: 05/21/2017  MRN# [id number] [name] 10-31-1933   [name] is a 83 y.o. old male seen in follow up for chief complaint of  Chief Complaint  Patient presents with  . Sleep Apnea    Pt reports no issues with cpap therapy.     HPI:   The patient is an 83 yo male with a history OSA and COPD. He has been having trouble tolerating CPAP, he was sent for a mask fitting, he has been through several types of masks. It seems that his main problem is taking the mask off in the middle of the night but not know that he is doing it.  He feels that he has been doing well with  his machine, he occasionally takes it off at night without realizing it. At last visit he was started on medication to treat restless leg syndrome.  He has continued on advair twice daily, on most days but often forgets doses, he feels that his breathing has been doing well with it.   **Review of download data for 1 month from 01/18/17-6/18: Uses greater than 4 hours is 60%. Average usage on days used is 4 hours 55 minutes. Pressure setting 10-20. Median pressure 10.5, 95th percentile pressure of 13.6, maximum 50.1. Residual AHI is 8.7, which are predominantly hypopneas. No Cheyne-Stokes noted.  **Review of his recent sleep titration study showed that his OSA was inadequately treated at the current level, and his PLMI and arousal index remained elevated at 5. At last visit his CPAP was changed to auto 10-20, he was asked to try to judiciously increase his activity as it was thought that he had some deconditioning, and he was asked to continue advair. He continues to use advair.    **Review of download data 30 days as of 05/24/17; Uses greater than 4 hours is 18 days, average usage on days used was 4 hours 57 minutes, sit pressors 8, residual AHI is 15.9, central apnea index is 1.7.  Review and summary of previous testing: --PFT 11/29/14; FVC=93%; Fev1=103%; ratio=80%. RV=58%, TLC=76%, rv/tlc=71%; Diffusion=37%; appears consistent with restriction.  --The patient has a history of OSA and COPD. Review of sleep study on 05/14/11 showed AHI of 6.9 with RDI of  41. Sleep efficiency was 53% and latency was short at 8 min. PLMI was very elevated at 85 with an arousal index of 9.  --Titration study on 12/12/14; sleep latency of 11 min; with sleep efficiency of 50%, AHI=40. Cpap titrated from 4-14; CPAP of 12 was recommended, however the patient continued to have elevated of 26 at pressure of 14.   Ct images reviewed; 12/15/15; 02/21/15--  There is persistent emphysematous changes, greatest in upper lobes. Stable  RUL calcified granuloma. 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 Per report it is unchanged, but to my eye it appears that it is advancing. Slight mediastinal lymphadenopathy, paratracheal areas.    Medication:   Outpatient Encounter Prescriptions as of 05/26/2017  Medication Sig  . acetaminophen (TYLENOL) 500 MG tablet Take 500-1,000 mg by mouth every 6 (six) hours as needed (for pain.).  [name] apixaban (ELIQUIS) 5 MG TABS tablet Take 5 mg by mouth 2 (two) times daily.   . Ascorbic Acid (VITAMIN C) 1000 MG tablet Take 1,000 mg by mouth daily.  . Cholecalciferol (VITAMIN D-3) 5000 units TABS Take 5,000 Units by mouth every evening.  . Cyanocobalamin (VITAMIN B-12) 3000 MCG SUBL Place 3,000 mcg under the tongue daily.  [name] dexlansoprazole (DEXILANT) 60 MG capsule Take 1 capsule (60 mg total) by mouth daily.  [name] ezetimibe (ZETIA) 10 MG tablet TAKE ONE TABLET EVERY DAY  . finasteride (PROSCAR) 5 MG tablet Take 5 mg by mouth daily.  . fluticasone (FLONASE) 50 MCG/ACT nasal spray Place 2 sprays into both nostrils daily.  . Fluticasone-Salmeterol (ADVAIR DISKUS) 250-50 MCG/DOSE AEPB Inhale 1 puff into the lungs 2 (two) times daily.  [name] ipratropium-albuterol (DUONEB) 0.5-2.5 (3) MG/3ML SOLN Inhale 3 mLs into the lungs every 4 (four) hours as needed for shortness of breath or wheezing.  [name] lisinopril (PRINIVIL,ZESTRIL) 2.5 MG tablet TAKE ONE TABLET BY MOUTH TWICE DAILY  . magnesium oxide (MAG-OX) 400 MG tablet Take 400 mg by mouth every evening.   . metoprolol tartrate (LOPRESSOR) 25 MG tablet TAKE ONE TABLET TWICE DAILY  . mirabegron ER (MYRBETRIQ) 25 MG TB24 tablet Take 25 mg by mouth every morning.   . Multiple Vitamins-Minerals (PRESERVISION AREDS 2 PO) Take 1 tablet by mouth 2 (two) times daily.  . nitroGLYCERIN (NITROSTAT) 0.4 MG SL tablet Place 1 tablet (0.4 mg total) under the tongue every 5 (five) minutes as needed for chest pain.  [name] Glycol-Propyl  Glycol (SYSTANE) 0.4-0.3 % SOLN Place 1 drop into both eyes 3 (three) times daily as needed (FOR DRY EYES).  . rosuvastatin (CRESTOR) 20 MG tablet TAKE ONE TABLET BY MOUTH EVERY DAY (Patient taking differently: TAKE ONE TABLET BY MOUTH EVERY DAY IN am)  . tamsulosin (FLOMAX) 0.4 MG CAPS capsule Take 0.4 mg by mouth daily.   No facility-administered encounter medications on file as of 05/26/2017.      Allergies:  Clonazepam; Codeine; Niacin and related; Tramadol; Hydrocodone; and Oxycodone  Review of Systems: Gen:  Denies  fever, sweats. HEENT: Denies blurred vision. Cvc:  No dizziness, chest pain or heaviness Resp:   Denies cough or sputum porduction. Gi: Denies swallowing difficulty, stomach pain. constipation, bowel incontinence Gu:  Denies bladder incontinence, burning urine Ext:   No Joint pain, stiffness. Skin: No skin rash, easy bruising. Endoc:  No polyuria, polydipsia. Psych: No depression, insomnia. Other:  All other systems were reviewed and found to be negative other than what is mentioned in the HPI.   Physical Examination:  VS: BP (!) 142/80 (BP Location: Left Arm, Cuff Size: Normal)   Pulse 78   Resp 16   Ht 6' (1.829 m)   Wt 226 lb (102.5 kg)   SpO2 100%   BMI 30.65 kg/m   General Appearance: No distress  Neuro:without focal findings,  speech normal,  HEENT: PERRLA, EOM intact. Pulmonary: normal breath sounds, No wheezing.   CardiovascularNormal S1,S2.  No m/r/g.   Abdomen: Benign, Soft, non-tender. Renal:  No costovertebral tenderness  GU:  Not performed at this time. Endoc: No evident thyromegaly, no signs of acromegaly. Skin:   warm, no rash. Extremities: normal, no cyanosis, clubbing.   LABORATORY PANEL:   CBC No results for input(s): WBC, HGB, HCT, PLT in the last 168 hours. ------------------------------------------------------------------------------------------------------------------  Chemistries  No results for input(s): NA, K, CL, CO2,  GLUCOSE, BUN, CREATININE, CALCIUM, MG, AST, ALT, ALKPHOS, BILITOT in the last 168 hours.  Invalid input(s): GFRCGP ------------------------------------------------------------------------------------------------------------------  Cardiac Enzymes No results for input(s): TROPONINI in the last 168 hours. ------------------------------------------------------------  RADIOLOGY:   No results found for this or any previous visit. Results for orders placed during the hospital encounter of 06/02/16  DG Chest 2 View   Narrative CLINICAL DATA:  Fatigue and weakness.  EXAM: CHEST  2 VIEW  COMPARISON:  CT of the chest on 12/05/2015  FINDINGS: The heart size and mediastinal contours are within normal limits and stable post CABG. Stable calcified granuloma at the right lung apex. Stable bilateral parenchymal scarring. There is no evidence of pulmonary edema, consolidation, pneumothorax, nodule or pleural fluid. The visualized skeletal structures are unremarkable.  IMPRESSION: No active cardiopulmonary disease.   Electronically Signed   By: Aletta Edouard M.D.   On: 06/02/2016 16:19    ------------------------------------------------------------------------------------------------------------------  Thank  you for allowing [facility] to assist in the care of your patient. Our recommendations are noted above.  Please contact us if we can be of further service.   Marda Stalker, MD.  [facility] Office Number: [phone]  Patricia Pesa, M.D.  Merton Border, M.D  05/21/2017

## 2017-05-23 ENCOUNTER — Encounter: Payer: Self-pay | Admitting: Cardiology

## 2017-05-23 NOTE — Assessment & Plan Note (Signed)
As far significantly is not any breakthrough episodes of A. fib. Continue current dose of beta blocker for rate control and Eliquis for anticoagulation. No bleeding issues

## 2017-05-23 NOTE — Assessment & Plan Note (Signed)
Most recently the panel looked pretty good. Pretty much at goal current dose of rosuvastatin

## 2017-05-23 NOTE — Assessment & Plan Note (Signed)
Doing better with lower dose beta blocker. Only notes some dyspnea with significant exertion.

## 2017-05-23 NOTE — Assessment & Plan Note (Signed)
As is usually the case, his blood pressure is higher here today the patient is elsewhere. No headaches, blurred vision or dizziness. He tells me at home his blood pressure usually in the 140 mmHg range. As such, I would not recommend further titrating his medications.

## 2017-05-23 NOTE — Assessment & Plan Note (Signed)
His ways been fluctuating up and down, no significant change. He continues to be active and is trying to do the best he can with exercising.

## 2017-05-23 NOTE — Assessment & Plan Note (Signed)
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 The didn't see very much native circumflex disease, and the RCA was totally closed. See other CAD segment.

## 2017-05-23 NOTE — Assessment & Plan Note (Addendum)
Doing fairly well with his current status of coronary artery disease. Last Myoview was in 2016. Nonischemic. Normal EF. He will be due for follow-up study in about 2 years, we can discuss if he would want to do follow-up or not.  Continue current dose of beta blocker and ACE inhibitor and Crestor post area. He is not on either aspirin or Plavix, because he is taking Eliquis

## 2017-05-24 ENCOUNTER — Encounter: Payer: Self-pay | Admitting: Internal Medicine

## 2017-05-26 ENCOUNTER — Ambulatory Visit (INDEPENDENT_AMBULATORY_CARE_PROVIDER_SITE_OTHER): Payer: Medicare Other | Admitting: Internal Medicine

## 2017-05-26 ENCOUNTER — Encounter: Payer: Self-pay | Admitting: Internal Medicine

## 2017-05-26 VITALS — BP 142/80 | HR 78 | Resp 16 | Ht 72.0 in | Wt 226.0 lb

## 2017-05-26 DIAGNOSIS — I779 Disorder of arteries and arterioles, unspecified: Secondary | ICD-10-CM

## 2017-05-26 DIAGNOSIS — J432 Centrilobular emphysema: Secondary | ICD-10-CM | POA: Diagnosis not present

## 2017-05-26 DIAGNOSIS — Z9989 Dependence on other enabling machines and devices: Secondary | ICD-10-CM

## 2017-05-26 DIAGNOSIS — G4733 Obstructive sleep apnea (adult) (pediatric): Secondary | ICD-10-CM

## 2017-05-26 NOTE — Addendum Note (Signed)
Addended by: Devona Konig on: 05/26/2017 12:01 PM   Modules accepted: Orders, SmartSet

## 2017-05-26 NOTE — Patient Instructions (Signed)
--  Will change your cpap pressure from 8 to 10-20.  --Continue  Using your advair twice daily.

## 2017-05-27 DIAGNOSIS — H353112 Nonexudative age-related macular degeneration, right eye, intermediate dry stage: Secondary | ICD-10-CM | POA: Diagnosis not present

## 2017-05-28 ENCOUNTER — Ambulatory Visit: Payer: Medicare Other | Admitting: Cardiology

## 2017-06-05 DIAGNOSIS — L720 Epidermal cyst: Secondary | ICD-10-CM | POA: Diagnosis not present

## 2017-06-05 DIAGNOSIS — D485 Neoplasm of uncertain behavior of skin: Secondary | ICD-10-CM | POA: Diagnosis not present

## 2017-06-05 DIAGNOSIS — Z85828 Personal history of other malignant neoplasm of skin: Secondary | ICD-10-CM | POA: Diagnosis not present

## 2017-06-05 DIAGNOSIS — L538 Other specified erythematous conditions: Secondary | ICD-10-CM | POA: Diagnosis not present

## 2017-06-05 DIAGNOSIS — L570 Actinic keratosis: Secondary | ICD-10-CM | POA: Diagnosis not present

## 2017-06-05 DIAGNOSIS — X32XXXA Exposure to sunlight, initial encounter: Secondary | ICD-10-CM | POA: Diagnosis not present

## 2017-06-05 DIAGNOSIS — Z08 Encounter for follow-up examination after completed treatment for malignant neoplasm: Secondary | ICD-10-CM | POA: Diagnosis not present

## 2017-06-05 DIAGNOSIS — L820 Inflamed seborrheic keratosis: Secondary | ICD-10-CM | POA: Diagnosis not present

## 2017-06-12 ENCOUNTER — Other Ambulatory Visit: Payer: Self-pay | Admitting: Cardiology

## 2017-06-12 ENCOUNTER — Other Ambulatory Visit: Payer: Self-pay | Admitting: Family Medicine

## 2017-06-12 DIAGNOSIS — E785 Hyperlipidemia, unspecified: Secondary | ICD-10-CM

## 2017-06-12 DIAGNOSIS — J301 Allergic rhinitis due to pollen: Secondary | ICD-10-CM

## 2017-06-12 NOTE — Telephone Encounter (Signed)
Refill request

## 2017-06-12 NOTE — Telephone Encounter (Signed)
Pharmacy requesting refills. Thanks!  

## 2017-06-14 NOTE — Telephone Encounter (Signed)
Please refill.  [place]

## 2017-07-10 ENCOUNTER — Ambulatory Visit (INDEPENDENT_AMBULATORY_CARE_PROVIDER_SITE_OTHER): Payer: Medicare Other | Admitting: Family Medicine

## 2017-07-10 ENCOUNTER — Encounter: Payer: Self-pay | Admitting: Family Medicine

## 2017-07-10 ENCOUNTER — Other Ambulatory Visit: Payer: Self-pay | Admitting: Cardiology

## 2017-07-10 VITALS — BP 120/74 | HR 69 | Temp 97.7°F | Resp 16 | Wt 230.0 lb

## 2017-07-10 DIAGNOSIS — K921 Melena: Secondary | ICD-10-CM | POA: Diagnosis not present

## 2017-07-10 DIAGNOSIS — R1013 Epigastric pain: Secondary | ICD-10-CM | POA: Diagnosis not present

## 2017-07-10 DIAGNOSIS — I779 Disorder of arteries and arterioles, unspecified: Secondary | ICD-10-CM

## 2017-07-10 NOTE — Progress Notes (Signed)
Patient: [name] Male    DOB: 1934-08-20   83 y.o.   MRN: [id number] Visit Date: 07/10/2017  Today's Provider: Lelon Huh, MD   Chief Complaint  Patient presents with  . Melena   Subjective:    Patient states he has had black stools for 3 weeks. Also having heartburn and full feeling in his lower throat. No abdominal pain. Other symptoms include bloatedness and increase in flatus.  No OTC NSAIDS. Not taking iron supplements or pepto-bismol. No diarrhea, has had mild mid abdominal discomfort. No feeling unusually fatigued , dizziness, or shortness of breath. He is on Eliquis for atrial fibrillation.    Allergies  Allergen Reactions  . Clonazepam Other (See Comments)    Altered mental status  . Codeine Other (See Comments)    Altered mental status.  . Niacin And Related Other (See Comments)    Flushing of skin  . Tramadol     Other reaction(s): Hallucination  . Hydrocodone     confusion  . Oxycodone Other (See Comments)    confusion confusion     Current Outpatient Medications:  .  acetaminophen (TYLENOL) 500 MG tablet, Take 500-1,000 mg by mouth every 6 (six) hours as needed (for pain.)., Disp: , Rfl:  .  Ascorbic Acid (VITAMIN C) 1000 MG tablet, Take 1,000 mg by mouth daily., Disp: , Rfl:  .  Cholecalciferol (VITAMIN D-3) 5000 units TABS, Take 5,000 Units by mouth every evening., Disp: , Rfl:  .  Cyanocobalamin (VITAMIN B-12) 3000 MCG SUBL, Place 3,000 mcg under the tongue daily., Disp: , Rfl:  .  dexlansoprazole (DEXILANT) 60 MG capsule, Take 1 capsule (60 mg total) by mouth daily., Disp: 30 capsule, Rfl: 11 .  ELIQUIS 5 MG TABS tablet, TAKE ONE TABLET TWICE DAILY, Disp: 60 tablet, Rfl: 0 .  ezetimibe (ZETIA) 10 MG tablet, TAKE ONE TABLET EVERY DAY, Disp: 90 tablet, Rfl: 4 .  finasteride (PROSCAR) 5 MG tablet, Take 5 mg by mouth daily., Disp: , Rfl:  .  fluticasone (FLONASE) 50 MCG/ACT nasal spray, Place 2 sprays into both nostrils daily. (Patient  taking differently: Place 2 sprays into both nostrils daily as needed. ), Disp: 16 g, Rfl: 6 .  Fluticasone-Salmeterol (ADVAIR DISKUS) 250-50 MCG/DOSE AEPB, Inhale 1 puff into the lungs 2 (two) times daily., Disp: 3 each, Rfl: 3 .  ipratropium-albuterol (DUONEB) 0.5-2.5 (3) MG/3ML SOLN, Inhale 3 mLs into the lungs every 4 (four) hours as needed for shortness of breath or wheezing., Disp: , Rfl: 1 .  lisinopril (PRINIVIL,ZESTRIL) 2.5 MG tablet, TAKE ONE TABLET BY MOUTH TWICE DAILY, Disp: 180 tablet, Rfl: 3 .  magnesium oxide (MAG-OX) 400 MG tablet, Take 400 mg by mouth every evening. , Disp: , Rfl:  .  metoprolol tartrate (LOPRESSOR) 25 MG tablet, TAKE ONE TABLET TWICE DAILY, Disp: 180 tablet, Rfl: 2 .  mirabegron ER (MYRBETRIQ) 25 MG TB24 tablet, Take 25 mg by mouth every morning. , Disp: , Rfl:  .  montelukast (SINGULAIR) 10 MG tablet, TAKE ONE TABLET BY MOUTH AT BEDTIME, Disp: 30 tablet, Rfl: 5 .  Multiple Vitamins-Minerals (PRESERVISION AREDS 2 PO), Take 1 tablet by mouth 2 (two) times daily., Disp: , Rfl:  .  nitroGLYCERIN (NITROSTAT) 0.4 MG SL tablet, Place 1 tablet (0.4 mg total) under the tongue every 5 (five) minutes as needed for chest pain., Disp: 20 tablet, Rfl: 1 .  NON FORMULARY, Nystatin elixir month wash, Disp: , Rfl:  .  Polyethyl Glycol-Propyl Glycol (SYSTANE) 0.4-0.3 % SOLN, Place 1 drop into both eyes 3 (three) times daily as needed (FOR DRY EYES)., Disp: , Rfl:  .  rosuvastatin (CRESTOR) 20 MG tablet, TAKE ONE TABLET EVERY DAY, Disp: 90 tablet, Rfl: 4 .  tamsulosin (FLOMAX) 0.4 MG CAPS capsule, Take 0.4 mg by mouth daily., Disp: , Rfl:   Review of Systems  Constitutional: Negative for appetite change, chills and fever.  Respiratory: Negative for chest tightness, shortness of breath and wheezing.   Cardiovascular: Negative for chest pain and palpitations.  Gastrointestinal: Negative for abdominal pain, nausea and vomiting.    Social History   Tobacco Use  . Smoking status:  Former Smoker    Types: Cigarettes    Last attempt to quit: 08/04/1997    Years since quitting: 19.9  . Smokeless tobacco: Never Used  Substance Use Topics  . Alcohol use: No   Objective:   BP 120/74 (BP Location: Left Arm, Patient Position: Sitting, Cuff Size: Large)   Pulse 69   Temp 97.7 F (36.5 C) (Oral)   Resp 16   Wt 230 lb (104.3 kg)   SpO2 99%   BMI 31.19 kg/m  Vitals:   07/10/17 1611  BP: 120/74  Pulse: 69  Resp: 16  Temp: 97.7 F (36.5 C)  TempSrc: Oral  SpO2: 99%  Weight: 230 lb (104.3 kg)     Physical Exam   General Appearance:    Alert, cooperative, no distress  Eyes:    PERRL, conjunctiva/corneas clear, EOM's intact       Lungs:     Clear to auscultation bilaterally, respirations unlabored  Heart:    Regular rate and rhythm  Neurologic:   Awake, alert, oriented x 3. No apparent focal neurological           defect.           Assessment & Plan:     1. Melena  - CBC - COMPLETE METABOLIC PANEL WITH GFR - H. pylori breath test - IFOBT POC (occult bld, rslt in office); Future  Hold eliquis  2. Epigastric abdominal pain  - CBC - COMPLETE METABOLIC PANEL WITH GFR - H. pylori breath test       Lelon Huh, MD  [facility]

## 2017-07-10 NOTE — Telephone Encounter (Signed)
GI bleed?? See note from 07/10/2017 - [facility]

## 2017-07-13 ENCOUNTER — Telehealth: Payer: Self-pay

## 2017-07-13 ENCOUNTER — Other Ambulatory Visit: Payer: Self-pay | Admitting: *Deleted

## 2017-07-13 DIAGNOSIS — R195 Other fecal abnormalities: Secondary | ICD-10-CM

## 2017-07-13 DIAGNOSIS — K921 Melena: Secondary | ICD-10-CM

## 2017-07-13 LAB — IFOBT (OCCULT BLOOD): IFOBT: POSITIVE

## 2017-07-13 NOTE — Telephone Encounter (Signed)
Advised patient of results. Order for GI referral has been placed.

## 2017-07-13 NOTE — Telephone Encounter (Signed)
-----   Message from [name], MD sent at 07/13/2017  8:57 AM EST ----- Blood count is normal, but stool test is positive for blood. Need referral to GI for further evaluation.  H.pylori test is still pending.

## 2017-07-14 ENCOUNTER — Telehealth: Payer: Self-pay

## 2017-07-14 DIAGNOSIS — R1013 Epigastric pain: Secondary | ICD-10-CM | POA: Diagnosis not present

## 2017-07-14 LAB — COMPLETE METABOLIC PANEL WITH GFR
AG Ratio: 1.9 (calc) (ref 1.0–2.5)
ALT: 16 U/L (ref 9–46)
AST: 20 U/L (ref 10–35)
Albumin: 4.4 g/dL (ref 3.6–5.1)
Alkaline phosphatase (APISO): 83 U/L (ref 40–115)
BUN/Creatinine Ratio: 13 (calc) (ref 6–22)
BUN: 17 mg/dL (ref 7–25)
CO2: 25 mmol/L (ref 20–32)
Calcium: 9.3 mg/dL (ref 8.6–10.3)
Chloride: 108 mmol/L (ref 98–110)
Creat: 1.29 mg/dL — ABNORMAL HIGH (ref 0.70–1.11)
GFR, Est African American: 60 mL/min/{1.73_m2} (ref >=60)
GFR, Est Non African American: 52 mL/min/{1.73_m2} — ABNORMAL LOW (ref >=60)
Globulin: 2.3 g/dL (calc) (ref 1.9–3.7)
Glucose, Bld: 99 mg/dL (ref 65–99)
Potassium: 3.9 mmol/L (ref 3.5–5.3)
Sodium: 144 mmol/L (ref 135–146)
Total Bilirubin: 0.9 mg/dL (ref 0.2–1.2)
Total Protein: 6.7 g/dL (ref 6.1–8.1)

## 2017-07-14 LAB — CBC
HCT: 41 % (ref 38.5–50.0)
Hemoglobin: 14 g/dL (ref 13.2–17.1)
MCH: 31 pg (ref 27.0–33.0)
MCHC: 34.1 g/dL (ref 32.0–36.0)
MCV: 90.9 fL (ref 80.0–100.0)
MPV: 11.1 fL (ref 7.5–12.5)
Platelets: 165 10*3/uL (ref 140–400)
RBC: 4.51 10*6/uL (ref 4.20–5.80)
RDW: 13.8 % (ref 11.0–15.0)
WBC: 11.3 10*3/uL — ABNORMAL HIGH (ref 3.8–10.8)

## 2017-07-14 LAB — H. PYLORI BREATH TEST

## 2017-07-14 NOTE — Telephone Encounter (Signed)
Order for h pylori breath test has been printed. Patient is coming by today to have test done. [name], the patient's caregiver, is wanting to know why he is to stop the eliquis? She reports that the patient's stools are now grey in color. She feels nervous with him stopping the eliquis because he can not get in to see Dr. Allen Norris until 12/18. Please advise. Thanks!

## 2017-07-14 NOTE — Telephone Encounter (Signed)
Advised patient as below. Also OC lite kit was given when the patient came to the office today to have h pylori test repeated. Advised to repeat OC lite while off of eliquis for 5 days. Patient verbalized understanding.

## 2017-07-14 NOTE — Telephone Encounter (Signed)
eliquis is a blood thinner and will aggravate a GI bleed. Can repeat OC-lyte in 5 days. Will be able to resume Eliquis when stool test stops showing blood.

## 2017-07-14 NOTE — Telephone Encounter (Signed)
-----   Message from [name], MD sent at 07/14/2017 10:42 AM EST ----- Please advise patient that there was a collection and [facility] is not able to do h. Pylori test. Please see if he can come back to redo collection this week.  Also need to stop Eliquis for a week, if he hasn't already.

## 2017-07-15 ENCOUNTER — Telehealth: Payer: Self-pay

## 2017-07-15 LAB — H. PYLORI BREATH TEST: H. pylori Breath Test: DETECTED — AB

## 2017-07-15 MED ORDER — CLARITHROMYCIN 500 MG PO TABS: 500.0000 mg | ORAL_TABLET | Freq: Two times a day (BID) | ORAL | 0 refills | Status: DC

## 2017-07-15 MED ORDER — AMOXICILLIN 500 MG PO CAPS: 1000.0000 mg | ORAL_CAPSULE | Freq: Two times a day (BID) | ORAL | 0 refills | Status: DC

## 2017-07-15 NOTE — Telephone Encounter (Signed)
Advised patient of results. Medication was sent into the pharmacy. Patient has an appt scheduled in 3 weeks.

## 2017-07-15 NOTE — Telephone Encounter (Signed)
-----   Message from [name], MD sent at 07/15/2017  1:03 PM EST ----- H. Pylori test is positive. Need to start amoxicillin 500mg  two twice daily for 14 days, #56 and clarithromycin 500mg  one twice a day for 14 days, #28.  Schedule follow up OV in  3 weeks.

## 2017-07-16 DIAGNOSIS — N3941 Urge incontinence: Secondary | ICD-10-CM | POA: Diagnosis not present

## 2017-07-16 DIAGNOSIS — R339 Retention of urine, unspecified: Secondary | ICD-10-CM | POA: Diagnosis not present

## 2017-07-16 DIAGNOSIS — N401 Enlarged prostate with lower urinary tract symptoms: Secondary | ICD-10-CM | POA: Diagnosis not present

## 2017-07-16 DIAGNOSIS — N138 Other obstructive and reflux uropathy: Secondary | ICD-10-CM | POA: Diagnosis not present

## 2017-07-16 DIAGNOSIS — R972 Elevated prostate specific antigen [PSA]: Secondary | ICD-10-CM | POA: Diagnosis not present

## 2017-07-20 ENCOUNTER — Telehealth: Payer: Self-pay | Admitting: Family Medicine

## 2017-07-20 ENCOUNTER — Encounter: Payer: Self-pay | Admitting: Family Medicine

## 2017-07-20 DIAGNOSIS — K921 Melena: Secondary | ICD-10-CM

## 2017-07-20 LAB — IFOBT (OCCULT BLOOD): IFOBT: POSITIVE

## 2017-07-20 NOTE — Telephone Encounter (Signed)
Pt called back stating he was returning Rachelle's call. Please advise. Thanks TNP

## 2017-07-20 NOTE — Telephone Encounter (Signed)
Advised patient of results.  

## 2017-07-20 NOTE — Telephone Encounter (Signed)
-----   Message from [name], MD sent at 07/20/2017 12:21 PM EST ----- There is still blood in stool. He needs to stop Eliquis for 5 days, then resume one tablet once a day, instead of twice a day.

## 2017-08-14 ENCOUNTER — Encounter: Payer: Self-pay | Admitting: Family Medicine

## 2017-08-14 ENCOUNTER — Ambulatory Visit (INDEPENDENT_AMBULATORY_CARE_PROVIDER_SITE_OTHER): Payer: Medicare Other | Admitting: Family Medicine

## 2017-08-14 VITALS — BP 120/60 | HR 54 | Temp 98.2°F | Resp 16 | Wt 230.0 lb

## 2017-08-14 DIAGNOSIS — K2971 Gastritis, unspecified, with bleeding: Secondary | ICD-10-CM

## 2017-08-14 DIAGNOSIS — I779 Disorder of arteries and arterioles, unspecified: Secondary | ICD-10-CM

## 2017-08-14 DIAGNOSIS — B9681 Helicobacter pylori [H. pylori] as the cause of diseases classified elsewhere: Secondary | ICD-10-CM

## 2017-08-14 DIAGNOSIS — K117 Disturbances of salivary secretion: Secondary | ICD-10-CM

## 2017-08-14 DIAGNOSIS — K279 Peptic ulcer, site unspecified, unspecified as acute or chronic, without hemorrhage or perforation: Secondary | ICD-10-CM | POA: Diagnosis not present

## 2017-08-14 MED ORDER — GLYCOPYRROLATE 1 MG PO TABS: 1.0000 mg | ORAL_TABLET | Freq: Three times a day (TID) | ORAL | 3 refills | Status: DC | PRN

## 2017-08-14 NOTE — Progress Notes (Signed)
Patient: [name] Male    DOB: May 24, 1934   83 y.o.   MRN: [id number] Visit Date: 08/14/2017  Today's Provider: Lelon Huh, MD   Chief Complaint  Patient presents with  . Follow-up   Subjective:    HPI  H.pylori and dark stools From 07/10/2017-tested positive for H. Pylori. Started amoxicillin 500mg  two twice daily for 14 days, #56 and clarithromycin 500mg  one twice a day for 14 days, #28. Also had a positive oc light. Eliqus put on hold for 5 days then resume one tablet qd instead of bid.   Patient completed antibiotics and feels better, states his stool color has returned to normal. Has no pain or fatigue. Has resumed Eliquis.   Also complains of excessive salivation for the last year. Has a little bit of trouble every day, but worse when anxious. No other aggravating factors. No fascial or mouth pain.   Allergies  Allergen Reactions  . Clonazepam Other (See Comments)    Altered mental status  . Codeine Other (See Comments)    Altered mental status.  . Niacin And Related Other (See Comments)    Flushing of skin  . Tramadol     Other reaction(s): Hallucination  . Hydrocodone     confusion  . Oxycodone Other (See Comments)    confusion confusion     Current Outpatient Medications:  .  acetaminophen (TYLENOL) 500 MG tablet, Take 500-1,000 mg by mouth every 6 (six) hours as needed (for pain.)., Disp: , Rfl:  .  Ascorbic Acid (VITAMIN C) 1000 MG tablet, Take 1,000 mg by mouth daily., Disp: , Rfl:  .  Cholecalciferol (VITAMIN D-3) 5000 units TABS, Take 5,000 Units by mouth every evening., Disp: , Rfl:  .  Cyanocobalamin (VITAMIN B-12) 3000 MCG SUBL, Place 3,000 mcg under the tongue daily., Disp: , Rfl:  .  dexlansoprazole (DEXILANT) 60 MG capsule, Take 1 capsule (60 mg total) by mouth daily., Disp: 30 capsule, Rfl: 11 .  ELIQUIS 5 MG TABS tablet, TAKE ONE TABLET TWICE DAILY, Disp: 180 tablet, Rfl: 1 .  ezetimibe (ZETIA) 10 MG tablet, TAKE ONE TABLET EVERY  DAY, Disp: 90 tablet, Rfl: 4 .  finasteride (PROSCAR) 5 MG tablet, Take 5 mg by mouth daily., Disp: , Rfl:  .  fluticasone (FLONASE) 50 MCG/ACT nasal spray, Place 2 sprays into both nostrils daily. (Patient taking differently: Place 2 sprays into both nostrils daily as needed. ), Disp: 16 g, Rfl: 6 .  Fluticasone-Salmeterol (ADVAIR DISKUS) 250-50 MCG/DOSE AEPB, Inhale 1 puff into the lungs 2 (two) times daily., Disp: 3 each, Rfl: 3 .  ipratropium-albuterol (DUONEB) 0.5-2.5 (3) MG/3ML SOLN, Inhale 3 mLs into the lungs every 4 (four) hours as needed for shortness of breath or wheezing., Disp: , Rfl: 1 .  lisinopril (PRINIVIL,ZESTRIL) 2.5 MG tablet, TAKE ONE TABLET BY MOUTH TWICE DAILY, Disp: 180 tablet, Rfl: 3 .  magnesium oxide (MAG-OX) 400 MG tablet, Take 400 mg by mouth every evening. , Disp: , Rfl:  .  metoprolol tartrate (LOPRESSOR) 25 MG tablet, TAKE ONE TABLET TWICE DAILY, Disp: 180 tablet, Rfl: 2 .  mirabegron ER (MYRBETRIQ) 25 MG TB24 tablet, Take 25 mg by mouth every morning. , Disp: , Rfl:  .  montelukast (SINGULAIR) 10 MG tablet, TAKE ONE TABLET BY MOUTH AT BEDTIME, Disp: 30 tablet, Rfl: 5 .  Multiple Vitamins-Minerals (PRESERVISION AREDS 2 PO), Take 1 tablet by mouth 2 (two) times daily., Disp: , Rfl:  .  nitroGLYCERIN (NITROSTAT) 0.4 MG SL tablet, Place 1 tablet (0.4 mg total) under the tongue every 5 (five) minutes as needed for chest pain., Disp: 20 tablet, Rfl: 1 .  NON FORMULARY, Nystatin elixir month wash, Disp: , Rfl:  .  Polyethyl Glycol-Propyl Glycol (SYSTANE) 0.4-0.3 % SOLN, Place 1 drop into both eyes 3 (three) times daily as needed (FOR DRY EYES)., Disp: , Rfl:  .  rosuvastatin (CRESTOR) 20 MG tablet, TAKE ONE TABLET EVERY DAY, Disp: 90 tablet, Rfl: 4 .  tamsulosin (FLOMAX) 0.4 MG CAPS capsule, Take 0.4 mg by mouth daily., Disp: , Rfl:  .  amoxicillin (AMOXIL) 500 MG capsule, Take 2 capsules (1,000 mg total) 2 (two) times daily by mouth. (Patient not taking: Reported on  08/14/2017), Disp: 56 capsule, Rfl: 0 .  clarithromycin (BIAXIN) 500 MG tablet, Take 1 tablet (500 mg total) 2 (two) times daily by mouth. (Patient not taking: Reported on 08/14/2017), Disp: 28 tablet, Rfl: 0  Review of Systems  Constitutional: Negative for appetite change, chills and fever.  Respiratory: Negative for chest tightness, shortness of breath and wheezing.   Cardiovascular: Negative for chest pain and palpitations.  Gastrointestinal: Negative for abdominal pain, nausea and vomiting.    Social History   Tobacco Use  . Smoking status: Former Smoker    Types: Cigarettes    Last attempt to quit: 08/04/1997    Years since quitting: 20.0  . Smokeless tobacco: Never Used  Substance Use Topics  . Alcohol use: No   Objective:   BP 120/60 (BP Location: Right Arm, Patient Position: Sitting, Cuff Size: Large)   Pulse (!) 54   Temp 98.2 F (36.8 C) (Oral)   Resp 16   Wt 230 lb (104.3 kg)   SpO2 93%   BMI 31.19 kg/m  Vitals:   08/14/17 0932  BP: 120/60  Pulse: (!) 54  Resp: 16  Temp: 98.2 F (36.8 C)  TempSrc: Oral  SpO2: 93%  Weight: 230 lb (104.3 kg)     Physical Exam  General Appearance:    Alert, cooperative, no distress  HENT:   ENT exam normal, no neck nodes or sinus tenderness  Eyes:    PERRL, conjunctiva/corneas clear, EOM's intact       Lungs:     Clear to auscultation bilaterally, respirations unlabored  Heart:    Regular rate and rhythm  Neurologic:   Awake, alert, oriented x 3. No apparent focal neurological           defect.           Assessment & Plan:     1. Gastrointestinal hemorrhage associated with gastritis, unspecified gastritis type Stools have returned to normal since treatment, need to test for cure today.  - H. pylori breath test  2. H pylori ulcer  - H. pylori breath test  3. Sialorrhea  - glycopyrrolate (ROBINUL) 1 MG tablet; Take 1 tablet (1 mg total) by mouth 3 (three) times daily as needed.  Dispense: 60 tablet; Refill:  3       Lelon Huh, MD  [facility]

## 2017-08-15 ENCOUNTER — Other Ambulatory Visit: Payer: Self-pay | Admitting: Internal Medicine

## 2017-08-15 ENCOUNTER — Other Ambulatory Visit: Payer: Self-pay | Admitting: Family Medicine

## 2017-08-15 DIAGNOSIS — I10 Essential (primary) hypertension: Secondary | ICD-10-CM

## 2017-08-17 NOTE — Telephone Encounter (Signed)
Next ov 11-18-17

## 2017-08-18 ENCOUNTER — Ambulatory Visit (INDEPENDENT_AMBULATORY_CARE_PROVIDER_SITE_OTHER): Payer: Medicare Other | Admitting: Gastroenterology

## 2017-08-18 ENCOUNTER — Encounter: Payer: Self-pay | Admitting: Gastroenterology

## 2017-08-18 ENCOUNTER — Other Ambulatory Visit: Payer: Self-pay

## 2017-08-18 VITALS — BP 118/68 | HR 67 | Temp 97.4°F | Ht 72.0 in | Wt 230.6 lb

## 2017-08-18 DIAGNOSIS — G3184 Mild cognitive impairment, so stated: Secondary | ICD-10-CM | POA: Insufficient documentation

## 2017-08-18 DIAGNOSIS — G4733 Obstructive sleep apnea (adult) (pediatric): Secondary | ICD-10-CM | POA: Diagnosis not present

## 2017-08-18 DIAGNOSIS — E538 Deficiency of other specified B group vitamins: Secondary | ICD-10-CM | POA: Diagnosis not present

## 2017-08-18 DIAGNOSIS — R410 Disorientation, unspecified: Secondary | ICD-10-CM | POA: Diagnosis not present

## 2017-08-18 DIAGNOSIS — K921 Melena: Secondary | ICD-10-CM | POA: Diagnosis not present

## 2017-08-18 DIAGNOSIS — Z9989 Dependence on other enabling machines and devices: Secondary | ICD-10-CM | POA: Diagnosis not present

## 2017-08-18 DIAGNOSIS — R195 Other fecal abnormalities: Secondary | ICD-10-CM

## 2017-08-18 DIAGNOSIS — I779 Disorder of arteries and arterioles, unspecified: Secondary | ICD-10-CM

## 2017-08-18 DIAGNOSIS — F0390 Unspecified dementia without behavioral disturbance: Secondary | ICD-10-CM | POA: Diagnosis not present

## 2017-08-18 NOTE — Progress Notes (Signed)
Gastroenterology Consultation  Referring Provider:     [name], MD Primary Care Physician:  [name], MD Primary Gastroenterologist:  Dr. Allen Norris     Reason for Consultation:     Heme positive stools and melena        HPI:   [name] is a 83 y.o. y/o male referred for consultation & management of Heme positive stools and melena by Dr. Caryn Section, Kirstie Peri, MD.  This patient comes in today after being found to have H. Pylori positive for which she was treated for this.  The patient reports that he had an episode of black stools and also had stool sent off by his primary care provider who found him to have blood in his stools.  The patient denies any unexplained weight loss fevers chills nausea or vomiting.  The patient does report that he has been more constipated in the past few months.  The patient does have a history of colon polyps and states that his last colonoscopy was approximately 5 years ago.  There is no report of any abdominal pain at the present time.  Past Medical History:  Diagnosis Date  . Arthritis   . Basal cell carcinoma of eyelid 01/03/2013  . Cancer [facility])    Skin cancer  . Colon polyps   . COPD (chronic obstructive pulmonary disease) ([place])   . Coronary artery disease   . Dysrhythmia    afib  . GERD (gastroesophageal reflux disease)   . Hemorrhoid 02/10/2015  . History of hiatal hernia   . HOH (hard of hearing)   . Hypertension   . Memory loss   . Osteoporosis   . Prostate disorder   . Sciatic pain    Chronic  . Sleep apnea    cpap  . Temporary cerebral vascular dysfunction 02/10/2015   Had negative work-up.  July, 2012.  No medication changes, had forgot them that day, got dehydrated.     Past Surgical History:  Procedure Laterality Date  . ANKLE SURGERY    . Arch aortogram and carotid aortogram  06/02/2006   Dr Oneida Alar did surgery  . BASAL CELL CARCINOMA EXCISION  04/2016   Dermatology  . CARDIAC CATHETERIZATION  01/30/1998    total native LAD andRCA ,mod native CIRC; svg to RCA and OBTUSE MARG. PATENT the left internal mammary artery graft to LAD ; LV NORMAL  /  . CAROTID ENDARTERECTOMY Left Oct. 29, 2007   Dr. Oneida Alar:  . CATARACT EXTRACTION W/PHACO Right 03/05/2017   Procedure: CATARACT EXTRACTION PHACO AND INTRAOCULAR LENS PLACEMENT (IOC);  Surgeon: Leandrew Koyanagi, MD;  Location: [facility] ORS;  Service: Ophthalmology;  Laterality: Right;  [place] 00:58.5 AP% 10.6 CDE 6.20 Fluid Pack lot # [id number]  . CATARACT EXTRACTION W/PHACO Left 04/15/2017   Procedure: CATARACT EXTRACTION PHACO AND INTRAOCULAR LENS PLACEMENT ([place])  Left;  Surgeon: Leandrew Koyanagi, MD;  Location: [place];  Service: Ophthalmology;  Laterality: Left;  . CORONARY ANGIOPLASTY     stent  . CORONARY ARTERY BYPASS GRAFT  1998   LIMA-LAD, SVG-OM, SVG-RPDA, SVG-DIAG  . CPET / MET  12/2012   Mild chronotropic incompetence - read 82% of predicted; also reduced effort; peak VO2 15.7 / 75% (did not reach Max effort) -- suggested ischemic response in last 1.5 minutes of exercise. Normal pulmonary function on PFTs but poor response to  . DOPPLER ECHOCARDIOGRAPHY  11/20/2010   EF =>55%; LV norm mild aortic scelorosis  . FRACTURE SURGERY Right  03/19/2015   wrist  . HIATAL HERNIA REPAIR    . ILIAC ARTERY STENT  12/15/2005   PTA and direct stenting rgt and lft common iliac arteries  . NM MYOCAR PERF WALL MOTION  11/11/2010; February 2016   a) TM STRESS----NORMAL PERFUSION ,EF 68%; b) EF 50-55%. Normal LV function. No significant ischemia or infarction.  . ORIF ELBOW FRACTURE Right 01/01/2017   Procedure: OPEN REDUCTION INTERNAL FIXATION (ORIF) ELBOW/OLECRANON FRACTURE;  Surgeon: Hessie Knows, MD;  Location: [facility] ORS;  Service: Orthopedics;  Laterality: Right;  . ORIF WRIST FRACTURE Right 03/19/2015   Procedure: OPEN REDUCTION INTERNAL FIXATION (ORIF) WRIST FRACTURE;  Surgeon: Hessie Knows, MD;  Location: [facility] ORS;  Service: Orthopedics;  Laterality:  Right;  . THORACIC AORTA - CAROTID ANGIOGRAM  October 2007   Dr. 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 TRANSTHORACIC ECHOCARDIOGRAM  February 2016   [facility]: Normal LV function. Dilated left atrium.    Prior to Admission medications   Medication Sig Start Date End Date Taking? Authorizing Provider  acetaminophen (TYLENOL) 500 MG tablet Take 500-1,000 mg by mouth every 6 (six) hours as needed (for pain.).   Yes [provider]  ADVAIR DISKUS 250-50 MCG/DOSE AEPB INHALE 1 PUFF INTO THE LUNGS TWICE DAILY 08/17/17  Yes Laverle Hobby, MD  albuterol (PROVENTIL HFA) 108 (90 Base) MCG/ACT inhaler Inhale into the lungs. 11/14/14  Yes [provider]  Ascorbic Acid (VITAMIN C) 1000 MG tablet Take 1,000 mg by mouth daily.   Yes [provider]  Cholecalciferol (VITAMIN D-3) 5000 units TABS Take 5,000 Units by mouth every evening.   Yes [provider]  Cyanocobalamin (VITAMIN B-12) 3000 MCG SUBL Place 3,000 mcg under the tongue daily.   Yes [provider]  dexlansoprazole (DEXILANT) 60 MG capsule Take 1 capsule (60 mg total) by mouth daily. 01/15/17  Yes [name], MD  ELIQUIS 5 MG TABS tablet TAKE ONE TABLET TWICE DAILY 07/13/17  Yes Leonie Man, MD  ezetimibe (ZETIA) 10 MG tablet TAKE ONE TABLET EVERY DAY 03/18/17  Yes [name], MD  finasteride (PROSCAR) 5 MG tablet Take 5 mg by mouth daily. 07/15/16  Yes [provider]  fluticasone (FLONASE) 50 MCG/ACT nasal spray Place 2 sprays into both nostrils daily. Patient taking differently: Place 2 sprays into both nostrils daily as needed.  01/12/17  Yes [name], MD  Fluticasone-Salmeterol (ADVAIR DISKUS) 250-50 MCG/DOSE AEPB Inhale 1 puff into the lungs 2 (two) times daily. 09/12/16 09/12/17 Yes Laverle Hobby, MD  glycopyrrolate (ROBINUL) 1 MG tablet Take 1 tablet (1 mg total)  by mouth 3 (three) times daily as needed. 08/14/17  Yes [name], MD  lisinopril (PRINIVIL,ZESTRIL) 2.5 MG tablet TAKE ONE TABLET BY MOUTH TWICE DAILY 08/15/17  Yes [name], MD  magnesium oxide (MAG-OX) 400 MG tablet Take 400 mg by mouth every evening.    Yes [provider]  metoprolol tartrate (LOPRESSOR) 25 MG tablet TAKE ONE TABLET TWICE DAILY 05/14/17  Yes Leonie Man, MD  mirabegron ER (MYRBETRIQ) 25 MG TB24 tablet Take 25 mg by mouth every morning.    Yes [provider]  montelukast (SINGULAIR) 10 MG tablet TAKE ONE TABLET BY MOUTH AT BEDTIME 06/12/17  Yes Fisher, Kirstie Peri, MD  nitroGLYCERIN (NITROSTAT) 0.4 MG SL tablet Place 1 tablet (0.4 mg total) under the tongue every 5 (five) minutes as needed for chest  pain. 01/12/17  Yes Fisher, Kirstie Peri, MD  NON FORMULARY Nystatin elixir month wash   Yes [provider]  rosuvastatin (CRESTOR) 20 MG tablet TAKE ONE TABLET EVERY DAY 06/12/17  Yes [name], MD  tamsulosin (FLOMAX) 0.4 MG CAPS capsule Take 0.4 mg by mouth daily. 07/15/16  Yes [provider]  Biotin 1000 MCG tablet Take by mouth.    [provider]  ipratropium-albuterol (DUONEB) 0.5-2.5 (3) MG/3ML SOLN Inhale 3 mLs into the lungs every 4 (four) hours as needed for shortness of breath or wheezing. 12/19/16   [provider]  Multiple Vitamins-Minerals (PRESERVISION AREDS 2 PO) Take 1 tablet by mouth 2 (two) times daily.    [provider]  Polyethyl Glycol-Propyl Glycol (SYSTANE) 0.4-0.3 % SOLN Place 1 drop into both eyes 3 (three) times daily as needed (FOR DRY EYES).    [provider]    Family History  Problem Relation Age of Onset  . Ulcers Mother        Peptic  . Dementia Mother   . Alcohol abuse Father      Social History   Tobacco Use  . Smoking status: Former Smoker    Types: Cigarettes    Last attempt to quit: 08/04/1997    Years since quitting: 20.0  . Smokeless  tobacco: Never Used  Substance Use Topics  . Alcohol use: No  . Drug use: No    Allergies as of 08/18/2017 - Review Complete 08/14/2017  Allergen Reaction Noted  . Clonazepam Other (See Comments) 11/16/2014  . Codeine Other (See Comments) 01/13/2013  . Niacin and related Other (See Comments) 01/13/2013  . Tramadol  12/22/2016  . Hydrocodone  12/26/2016  . Oxycodone Other (See Comments) 12/26/2016    Review of Systems:    All systems reviewed and negative except where noted in HPI.   Physical Exam:  BP 118/68   Pulse 67   Temp (!) 97.4 F (36.3 C) (Oral)   Ht 6' (1.829 m)   Wt 230 lb 9.6 oz (104.6 kg)   BMI 31.27 kg/m  No LMP for male patient. Psych:  Alert and cooperative. Normal mood and affect. General:   Alert,  Well-developed, well-nourished, pleasant and cooperative in NAD Head:  Normocephalic and atraumatic. Eyes:  Sclera clear, no icterus.   Conjunctiva pink. Ears:  Normal auditory acuity. Nose:  No deformity, discharge, or lesions. Mouth:  No deformity or lesions,oropharynx pink & moist. Neck:  Supple; no masses or thyromegaly. Lungs:  Respirations even and unlabored.  Clear throughout to auscultation.   No wheezes, crackles, or rhonchi. No acute distress. Heart:  Regular rate and rhythm; no murmurs, clicks, rubs, or gallops. Abdomen:  Normal bowel sounds.  No bruits.  Soft, non-tender and non-distended without masses, hepatosplenomegaly or hernias noted.  No guarding or rebound tenderness.  Negative Carnett sign.   Rectal:  Deferred.  Msk:  Symmetrical without gross deformities.  Good, equal movement & strength bilaterally. Pulses:  Normal pulses noted. Extremities:  No clubbing or edema.  No cyanosis. Neurologic:  Alert and oriented x3;  grossly normal neurologically. Skin:  Intact without significant lesions or rashes.  No jaundice. Lymph Nodes:  No significant cervical adenopathy. Psych:  Alert and cooperative. Normal mood and affect.  Imaging Studies: No  results found.  Assessment and Plan:   [name] is a 83 y.o. y/o male who comes in today with a history of having an episode of melena which occurred only once.  The patient had his antibiotics after being diagnosed with H. Pylori.  The patient also has a history of colon polyps with his last colonoscopy 3 years ago.  Due to the patient's melena and heme positive stools with a history of colon polyps the patient will be set up for a EGD and colonoscopy.  The patient will also have his labs sent for a CBC to see if his hemoglobin has dropped.  The patient has been explained the plan and agrees with it.  Lucilla Lame, MD. Marval Regal   Note: This dictation was prepared with Dragon dictation along with smaller phrase technology. Any transcriptional errors that result from this process are unintentional.

## 2017-08-19 DIAGNOSIS — K921 Melena: Secondary | ICD-10-CM | POA: Diagnosis not present

## 2017-08-19 LAB — CBC WITH DIFFERENTIAL/PLATELET
Basophils Absolute: 0 10*3/uL (ref 0.0–0.2)
Basos: 0 %
EOS (ABSOLUTE): 0.2 10*3/uL (ref 0.0–0.4)
Eos: 2 %
Hematocrit: 42.4 % (ref 37.5–51.0)
Hemoglobin: 14.2 g/dL (ref 13.0–17.7)
Immature Grans (Abs): 0 10*3/uL (ref 0.0–0.1)
Immature Granulocytes: 0 %
Lymphocytes Absolute: 2.5 10*3/uL (ref 0.7–3.1)
Lymphs: 28 %
MCH: 31.2 pg (ref 26.6–33.0)
MCHC: 33.5 g/dL (ref 31.5–35.7)
MCV: 93 fL (ref 79–97)
Monocytes Absolute: 1.1 10*3/uL — ABNORMAL HIGH (ref 0.1–0.9)
Monocytes: 12 %
Neutrophils Absolute: 5.1 10*3/uL (ref 1.4–7.0)
Neutrophils: 58 %
Platelets: 172 10*3/uL (ref 150–379)
RBC: 4.55 x10E6/uL (ref 4.14–5.80)
RDW: 14.9 % (ref 12.3–15.4)
WBC: 9 10*3/uL (ref 3.4–10.8)

## 2017-08-21 LAB — H. PYLORI BREATH TEST

## 2017-08-27 ENCOUNTER — Telehealth: Payer: Self-pay

## 2017-08-27 DIAGNOSIS — J209 Acute bronchitis, unspecified: Secondary | ICD-10-CM | POA: Diagnosis not present

## 2017-08-27 NOTE — Telephone Encounter (Signed)
-----   Message from Lucilla Lame, MD sent at 08/20/2017  5:19 PM EST ----- The patient know that his blood count was normal.

## 2017-08-27 NOTE — Telephone Encounter (Signed)
Left vm with lab results. 

## 2017-09-04 ENCOUNTER — Other Ambulatory Visit: Payer: Self-pay

## 2017-09-04 ENCOUNTER — Encounter: Payer: Self-pay | Admitting: *Deleted

## 2017-09-04 ENCOUNTER — Telehealth: Payer: Self-pay | Admitting: Gastroenterology

## 2017-09-04 NOTE — Telephone Encounter (Signed)
Please call [name] at [phone] and let her know when the patient should stop his Eliquis. Patient doesn't understand and is hard of hearing.

## 2017-09-07 ENCOUNTER — Other Ambulatory Visit: Payer: Self-pay

## 2017-09-07 ENCOUNTER — Encounter: Payer: Self-pay | Admitting: Anesthesiology

## 2017-09-07 ENCOUNTER — Telehealth: Payer: Self-pay | Admitting: Gastroenterology

## 2017-09-07 DIAGNOSIS — K921 Melena: Secondary | ICD-10-CM

## 2017-09-07 NOTE — Telephone Encounter (Signed)
[name] called and wants to know when [name] needs to stop his Eliquis?

## 2017-09-07 NOTE — Progress Notes (Signed)
[name] has paroxysmal atrial fibrillation on Eliquis.  He is okay for him to stop Eliquis 24-48 hours prior to procedure.  Restart 1-2 days postop.  [name], MD

## 2017-09-07 NOTE — Telephone Encounter (Signed)
LORI HARRIS CALLED TODAY TO SEE WHEN HE NEEDS TO STOP HIS ELIQUIS? HE IS SCHEDULED FOR UPPER ENDOSCOPY & COLONOSCOPY ON 09-14-17 WITH DR [facility].PLEASE CALL LORI @ [phone].

## 2017-09-08 NOTE — Telephone Encounter (Signed)
Contacted [name] and notified her the blood thinner clearance has been sent to his cardiologist for approval.

## 2017-09-14 ENCOUNTER — Ambulatory Visit: Admission: RE | Admit: 2017-09-14 | Payer: Medicare Other | Source: Ambulatory Visit | Admitting: Gastroenterology

## 2017-09-14 ENCOUNTER — Encounter: Payer: Self-pay | Admitting: Student

## 2017-09-14 SURGERY — ESOPHAGOGASTRODUODENOSCOPY (EGD) WITH PROPOFOL
Anesthesia: Choice

## 2017-09-15 ENCOUNTER — Ambulatory Visit: Payer: Medicare Other | Admitting: Certified Registered Nurse Anesthetist

## 2017-09-15 ENCOUNTER — Encounter
Admission: RE | Disposition: A | Discharge status: Home / Self Care | Payer: Self-pay | Source: Ambulatory Visit | Attending: Gastroenterology

## 2017-09-15 ENCOUNTER — Encounter: Payer: Self-pay | Admitting: Anesthesiology

## 2017-09-15 ENCOUNTER — Other Ambulatory Visit: Payer: Self-pay

## 2017-09-15 ENCOUNTER — Ambulatory Visit
Admission: RE | Admit: 2017-09-15 | Discharge: 2017-09-15 | Disposition: A | Discharge status: Home / Self Care | Payer: Medicare Other | Source: Ambulatory Visit | Attending: Gastroenterology | Admitting: Gastroenterology

## 2017-09-15 DIAGNOSIS — C187 Malignant neoplasm of sigmoid colon: Secondary | ICD-10-CM | POA: Insufficient documentation

## 2017-09-15 DIAGNOSIS — Z9889 Other specified postprocedural states: Secondary | ICD-10-CM | POA: Insufficient documentation

## 2017-09-15 DIAGNOSIS — K449 Diaphragmatic hernia without obstruction or gangrene: Secondary | ICD-10-CM | POA: Insufficient documentation

## 2017-09-15 DIAGNOSIS — K5730 Diverticulosis of large intestine without perforation or abscess without bleeding: Secondary | ICD-10-CM | POA: Insufficient documentation

## 2017-09-15 DIAGNOSIS — M810 Age-related osteoporosis without current pathological fracture: Secondary | ICD-10-CM | POA: Diagnosis not present

## 2017-09-15 DIAGNOSIS — C186 Malignant neoplasm of descending colon: Secondary | ICD-10-CM | POA: Insufficient documentation

## 2017-09-15 DIAGNOSIS — D122 Benign neoplasm of ascending colon: Secondary | ICD-10-CM | POA: Insufficient documentation

## 2017-09-15 DIAGNOSIS — R195 Other fecal abnormalities: Secondary | ICD-10-CM | POA: Diagnosis not present

## 2017-09-15 DIAGNOSIS — K219 Gastro-esophageal reflux disease without esophagitis: Secondary | ICD-10-CM | POA: Diagnosis not present

## 2017-09-15 DIAGNOSIS — Z951 Presence of aortocoronary bypass graft: Secondary | ICD-10-CM | POA: Insufficient documentation

## 2017-09-15 DIAGNOSIS — K642 Third degree hemorrhoids: Secondary | ICD-10-CM | POA: Diagnosis not present

## 2017-09-15 DIAGNOSIS — Z9841 Cataract extraction status, right eye: Secondary | ICD-10-CM | POA: Diagnosis not present

## 2017-09-15 DIAGNOSIS — K222 Esophageal obstruction: Secondary | ICD-10-CM

## 2017-09-15 DIAGNOSIS — Z86010 Personal history of colonic polyps: Secondary | ICD-10-CM | POA: Insufficient documentation

## 2017-09-15 DIAGNOSIS — Z85828 Personal history of other malignant neoplasm of skin: Secondary | ICD-10-CM | POA: Diagnosis not present

## 2017-09-15 DIAGNOSIS — Z7901 Long term (current) use of anticoagulants: Secondary | ICD-10-CM | POA: Insufficient documentation

## 2017-09-15 DIAGNOSIS — K921 Melena: Secondary | ICD-10-CM | POA: Diagnosis not present

## 2017-09-15 DIAGNOSIS — I2510 Atherosclerotic heart disease of native coronary artery without angina pectoris: Secondary | ICD-10-CM | POA: Diagnosis not present

## 2017-09-15 DIAGNOSIS — M1990 Unspecified osteoarthritis, unspecified site: Secondary | ICD-10-CM | POA: Insufficient documentation

## 2017-09-15 DIAGNOSIS — Z87891 Personal history of nicotine dependence: Secondary | ICD-10-CM | POA: Diagnosis not present

## 2017-09-15 DIAGNOSIS — K317 Polyp of stomach and duodenum: Secondary | ICD-10-CM | POA: Diagnosis not present

## 2017-09-15 DIAGNOSIS — D124 Benign neoplasm of descending colon: Secondary | ICD-10-CM

## 2017-09-15 DIAGNOSIS — Z79899 Other long term (current) drug therapy: Secondary | ICD-10-CM | POA: Insufficient documentation

## 2017-09-15 DIAGNOSIS — J449 Chronic obstructive pulmonary disease, unspecified: Secondary | ICD-10-CM | POA: Insufficient documentation

## 2017-09-15 DIAGNOSIS — Z961 Presence of intraocular lens: Secondary | ICD-10-CM | POA: Insufficient documentation

## 2017-09-15 DIAGNOSIS — G4730 Sleep apnea, unspecified: Secondary | ICD-10-CM | POA: Diagnosis not present

## 2017-09-15 DIAGNOSIS — I10 Essential (primary) hypertension: Secondary | ICD-10-CM | POA: Diagnosis not present

## 2017-09-15 DIAGNOSIS — Z7951 Long term (current) use of inhaled steroids: Secondary | ICD-10-CM | POA: Insufficient documentation

## 2017-09-15 DIAGNOSIS — I4891 Unspecified atrial fibrillation: Secondary | ICD-10-CM | POA: Insufficient documentation

## 2017-09-15 DIAGNOSIS — Z9842 Cataract extraction status, left eye: Secondary | ICD-10-CM | POA: Diagnosis not present

## 2017-09-15 DIAGNOSIS — I739 Peripheral vascular disease, unspecified: Secondary | ICD-10-CM | POA: Diagnosis not present

## 2017-09-15 DIAGNOSIS — K635 Polyp of colon: Secondary | ICD-10-CM | POA: Diagnosis not present

## 2017-09-15 HISTORY — PX: COLONOSCOPY WITH PROPOFOL: SHX5780

## 2017-09-15 HISTORY — DX: Malignant neoplasm of sigmoid colon: C18.7

## 2017-09-15 HISTORY — PX: ESOPHAGOGASTRODUODENOSCOPY (EGD) WITH PROPOFOL: SHX5813

## 2017-09-15 SURGERY — COLONOSCOPY WITH PROPOFOL
Anesthesia: General

## 2017-09-15 MED ORDER — EPHEDRINE SULFATE 50 MG/ML IJ SOLN
INTRAMUSCULAR | Status: DC | PRN
  Administered 2017-09-15 (×2): 5 mg via INTRAVENOUS

## 2017-09-15 MED ORDER — PROPOFOL 500 MG/50ML IV EMUL
INTRAVENOUS | Status: AC
  Filled 2017-09-15: qty 50

## 2017-09-15 MED ORDER — IPRATROPIUM-ALBUTEROL 0.5-2.5 (3) MG/3ML IN SOLN
3.0000 mL | Freq: Four times a day (QID) | RESPIRATORY_TRACT | Status: DC
  Administered 2017-09-15: 3 mL via RESPIRATORY_TRACT

## 2017-09-15 MED ORDER — IPRATROPIUM-ALBUTEROL 0.5-2.5 (3) MG/3ML IN SOLN
RESPIRATORY_TRACT | Status: AC
  Administered 2017-09-15: 3 mL via RESPIRATORY_TRACT
  Filled 2017-09-15: qty 3

## 2017-09-15 MED ORDER — LIDOCAINE HCL (CARDIAC) 20 MG/ML IV SOLN
INTRAVENOUS | Status: DC | PRN
  Administered 2017-09-15: 50 mg via INTRAVENOUS

## 2017-09-15 MED ORDER — PROPOFOL 500 MG/50ML IV EMUL
INTRAVENOUS | Status: DC | PRN
  Administered 2017-09-15: 175 ug/kg/min via INTRAVENOUS

## 2017-09-15 MED ORDER — SODIUM CHLORIDE 0.9 % IV SOLN
INTRAVENOUS | Status: DC
  Administered 2017-09-15: 09:00:00 via INTRAVENOUS

## 2017-09-15 MED ORDER — PHENYLEPHRINE HCL 10 MG/ML IJ SOLN
INTRAMUSCULAR | Status: DC | PRN
  Administered 2017-09-15: 200 ug via INTRAVENOUS
  Administered 2017-09-15 (×5): 100 ug via INTRAVENOUS

## 2017-09-15 MED ORDER — PROPOFOL 10 MG/ML IV BOLUS
INTRAVENOUS | Status: DC | PRN
  Administered 2017-09-15: 30 mg via INTRAVENOUS
  Administered 2017-09-15: 20 mg via INTRAVENOUS

## 2017-09-15 MED ORDER — LIDOCAINE HCL (PF) 2 % IJ SOLN
INTRAMUSCULAR | Status: AC
  Filled 2017-09-15: qty 10

## 2017-09-15 NOTE — Anesthesia Post-op Follow-up Note (Signed)
Anesthesia QCDR form completed.        

## 2017-09-15 NOTE — Anesthesia Postprocedure Evaluation (Signed)
Anesthesia Post Note  Patient: [name]  Procedure(s) Performed: COLONOSCOPY WITH PROPOFOL (N/A ) ESOPHAGOGASTRODUODENOSCOPY (EGD) WITH PROPOFOL (N/A )  Patient location during evaluation: Endoscopy Anesthesia Type: General Level of consciousness: awake and alert Pain management: pain level controlled Vital Signs Assessment: post-procedure vital signs reviewed and stable Respiratory status: spontaneous breathing, nonlabored ventilation, respiratory function stable and patient connected to nasal cannula oxygen Cardiovascular status: blood pressure returned to baseline and stable Postop Assessment: no apparent nausea or vomiting Anesthetic complications: no     Last Vitals:  Vitals:   09/15/17 1020 09/15/17 1029  BP: 121/69 139/77  Pulse: 77 74  Resp: 12 16  Temp:    SpO2: 98% 97%    Last Pain:  Vitals:   09/15/17 0840  TempSrc: Tympanic                 [name]

## 2017-09-15 NOTE — Anesthesia Preprocedure Evaluation (Signed)
Anesthesia Evaluation  Patient identified by MRN, date of birth, ID band Patient awake    Reviewed: Allergy & Precautions, H&P , NPO status , Patient's Chart, lab work & pertinent test results, reviewed documented beta blocker date and time   History of Anesthesia Complications (+) PROLONGED EMERGENCE and history of anesthetic complications  Airway Mallampati: I  TM Distance: >3 FB Neck ROM: full    Dental  (+) Edentulous Upper, Edentulous Lower, Upper Dentures, Lower Dentures   Pulmonary neg shortness of breath, sleep apnea , COPD,  COPD inhaler, Recent URI , Resolved, former smoker,           Cardiovascular Exercise Tolerance: Good hypertension, (-) angina+ CAD, + CABG and + Peripheral Vascular Disease  (-) Past MI and (-) Cardiac Stents + dysrhythmias Atrial Fibrillation (-) Valvular Problems/Murmurs     Neuro/Psych neg Seizures PSYCHIATRIC DISORDERS CVA, No Residual Symptoms    GI/Hepatic Neg liver ROS, hiatal hernia, GERD  ,  Endo/Other  negative endocrine ROS  Renal/GU negative Renal ROS  negative genitourinary   Musculoskeletal   Abdominal   Peds  Hematology negative hematology ROS (+)   Anesthesia Other Findings Past Medical History: No date: Arthritis 01/03/2013: Basal cell carcinoma of eyelid No date: Cancer ([place])     Comment:  Skin cancer No date: Colon polyps No date: COPD (chronic obstructive pulmonary disease) ([facility]) No date: Coronary artery disease No date: Dysrhythmia     Comment:  afib No date: GERD (gastroesophageal reflux disease) 02/10/2015: Hemorrhoid No date: History of hiatal hernia No date: HOH (hard of hearing) No date: Hypertension No date: Memory loss No date: Osteoporosis No date: Prostate disorder No date: Sciatic pain     Comment:  Chronic No date: Sleep apnea     Comment:  cpap 02/10/2015: Temporary cerebral vascular dysfunction     Comment:  Had negative work-up.  July, 2012.   No medication               changes, had forgot them that day, got dehydrated.    Reproductive/Obstetrics negative OB ROS                             Anesthesia Physical Anesthesia Plan  ASA: III  Anesthesia Plan: General   Post-op Pain Management:    Induction: Intravenous  PONV Risk Score and Plan: 2 and Propofol infusion  Airway Management Planned: Nasal Cannula  Additional Equipment:   Intra-op Plan:   Post-operative Plan:   Informed Consent: I have reviewed the patients History and Physical, chart, labs and discussed the procedure including the risks, benefits and alternatives for the proposed anesthesia with the patient or authorized representative who has indicated his/her understanding and acceptance.   Dental Advisory Given  Plan Discussed with: Anesthesiologist, CRNA and Surgeon  Anesthesia Plan Comments:         Anesthesia Quick Evaluation

## 2017-09-15 NOTE — Op Note (Signed)
[facility] Gastroenterology Patient Name: [name] Procedure Date: 09/15/2017 9:24 AM MRN: [id number] Account #: [id number] Date of Birth: [birth year] Admit Type: Outpatient Age: 84 Room: [facility] ENDO ROOM 4 Gender: Male Note Status: Finalized Procedure:            Colonoscopy Indications:          Heme positive stool Providers:            Lucilla Lame MD, MD Referring MD:         Kirstie Peri. Caryn Section, MD (Referring MD) Medicines:            Propofol per Anesthesia Complications:        No immediate complications. Procedure:            Pre-Anesthesia Assessment:                       - Prior to the procedure, a History and Physical was                        performed, and patient medications and allergies were                        reviewed. The patient's tolerance of previous                        anesthesia was also reviewed. The risks and benefits of                        the procedure and the sedation options and risks were                        discussed with the patient. All questions were                        answered, and informed consent was obtained. Prior                        Anticoagulants: The patient has taken no previous                        anticoagulant or antiplatelet agents. ASA Grade                        Assessment: II - A patient with mild systemic disease.                        After reviewing the risks and benefits, the patient was                        deemed in satisfactory condition to undergo the                        procedure.                       After obtaining informed consent, the colonoscope was                        passed under direct vision. Throughout the procedure,  the patient's blood pressure, pulse, and oxygen                        saturations were monitored continuously. The                        Colonoscope was introduced through the anus and                        advanced to  the the cecum, identified by appendiceal                        orifice and ileocecal valve. The colonoscopy was                        performed without difficulty. The patient tolerated the                        procedure well. The quality of the bowel preparation                        was good. Findings:      The perianal and digital rectal examinations were normal.      Two sessile polyps were found in the ascending colon. The polyps were 4       to 5 mm in size. These polyps were removed with a cold snare. Resection       and retrieval were complete.      Two sessile polyps were found in the descending colon. The polyps were 4       to 8 mm in size. These polyps were removed with a cold snare. Resection       and retrieval were complete.      Multiple small-mouthed diverticula were found in the sigmoid colon.      Non-bleeding internal hemorrhoids were found during retroflexion. The       hemorrhoids were Grade III (internal hemorrhoids that prolapse but       require manual reduction). Impression:           - Two 4 to 5 mm polyps in the ascending colon, removed                        with a cold snare. Resected and retrieved.                       - Two 4 to 8 mm polyps in the descending colon, removed                        with a cold snare. Resected and retrieved.                       - Diverticulosis in the sigmoid colon.                       - Non-bleeding internal hemorrhoids. Recommendation:       - Discharge patient to home.                       - Resume previous diet.                       -  Continue present medications.                       - Await pathology results. Procedure Code(s):    --- Professional ---                       [phone], Colonoscopy, flexible; with removal of tumor(s),                        polyp(s), or other lesion(s) by snare technique Diagnosis Code(s):    --- Professional ---                       R19.5, Other fecal abnormalities                        D12.2, Benign neoplasm of ascending colon                       D12.4, Benign neoplasm of descending colon CPT copyright 2016 [employer]. All rights reserved. The codes documented in this report are preliminary and upon coder review may  be revised to meet current compliance requirements. Lucilla Lame MD, MD 09/15/2017 10:00:58 AM This report has been signed electronically. Number of Addenda: 0 Note Initiated On: 09/15/2017 9:24 AM Scope Withdrawal Time: 0 hours 9 minutes 18 seconds  Total Procedure Duration: 0 hours 15 minutes 5 seconds       [facility]

## 2017-09-15 NOTE — H&P (Signed)
Lucilla Lame, MD [address], [place] [address] Phone:[phone] Fax : [phone]  Primary Care Physician:  [name], MD Primary Gastroenterologist:  Dr. Allen Norris  Pre-Procedure History & Physical: HPI:  MCKENNON ZWART is a 84 y.o. male is here for an endoscopy and colonoscopy.   Past Medical History:  Diagnosis Date  . Arthritis   . Basal cell carcinoma of eyelid 01/03/2013  . Cancer [facility])    Skin cancer  . Colon polyps   . COPD (chronic obstructive pulmonary disease) ([place])   . Coronary artery disease   . Dysrhythmia    afib  . GERD (gastroesophageal reflux disease)   . Hemorrhoid 02/10/2015  . History of hiatal hernia   . HOH (hard of hearing)   . Hypertension   . Memory loss   . Osteoporosis   . Prostate disorder   . Sciatic pain    Chronic  . Sleep apnea    cpap  . Temporary cerebral vascular dysfunction 02/10/2015   Had negative work-up.  July, 2012.  No medication changes, had forgot them that day, got dehydrated.     Past Surgical History:  Procedure Laterality Date  . ANKLE SURGERY    . Arch aortogram and carotid aortogram  06/02/2006   Dr Oneida Alar did surgery  . BASAL CELL CARCINOMA EXCISION  04/2016   Dermatology  . CARDIAC CATHETERIZATION  01/30/1998     total native LAD andRCA ,mod native CIRC; svg to RCA and OBTUSE MARG. PATENT the left internal mammary artery graft to LAD ; LV NORMAL  /  . CAROTID ENDARTERECTOMY Left Oct. 29, 2007   Dr. Oneida Alar:  . CATARACT EXTRACTION W/PHACO Right 03/05/2017   Procedure: CATARACT EXTRACTION PHACO AND INTRAOCULAR LENS PLACEMENT (IOC);  Surgeon: Leandrew Koyanagi, MD;  Location: [facility] ORS;  Service: Ophthalmology;  Laterality: Right;  [place] 00:58.5 AP% 10.6 CDE 6.20 Fluid Pack lot # [id number]  . CATARACT EXTRACTION W/PHACO Left 04/15/2017   Procedure: CATARACT EXTRACTION PHACO AND INTRAOCULAR LENS PLACEMENT ([place])  Left;  Surgeon: Leandrew Koyanagi, MD;  Location: [place];   Service: Ophthalmology;  Laterality: Left;  . CORONARY ANGIOPLASTY     stent  . CORONARY ARTERY BYPASS GRAFT  1998   LIMA-LAD, SVG-OM, SVG-RPDA, SVG-DIAG  . CPET / MET  12/2012   Mild chronotropic incompetence - read 82% of predicted; also reduced effort; peak VO2 15.7 / 75% (did not reach Max effort) -- suggested ischemic response in last 1.5 minutes of exercise. Normal pulmonary function on PFTs but poor response to  . DOPPLER ECHOCARDIOGRAPHY  11/20/2010   EF =>55%; LV norm mild aortic scelorosis  . FRACTURE SURGERY Right 03/19/2015   wrist  . HIATAL HERNIA REPAIR    . ILIAC ARTERY STENT  12/15/2005   PTA and direct stenting rgt and lft common iliac arteries  . NM MYOCAR PERF WALL MOTION  11/11/2010; February 2016   a) TM STRESS----NORMAL PERFUSION ,EF 68%; b) EF 50-55%. Normal LV function. No significant ischemia or infarction.  . ORIF ELBOW FRACTURE Right 01/01/2017   Procedure: OPEN REDUCTION INTERNAL FIXATION (ORIF) ELBOW/OLECRANON FRACTURE;  Surgeon: Hessie Knows, MD;  Location: [facility] ORS;  Service: Orthopedics;  Laterality: Right;  . ORIF WRIST FRACTURE Right 03/19/2015   Procedure: OPEN REDUCTION INTERNAL FIXATION (ORIF) WRIST FRACTURE;  Surgeon: Hessie Knows, MD;  Location: [facility] ORS;  Service: Orthopedics;  Laterality: Right;  . THORACIC AORTA - CAROTID ANGIOGRAM  October 2007   Dr. 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 TRANSTHORACIC ECHOCARDIOGRAM  February 2016   [facility]: Normal LV function. Dilated left atrium.    Prior to Admission medications   Medication Sig Start Date End Date Taking? Authorizing Provider  acetaminophen (TYLENOL) 500 MG tablet Take 500-1,000 mg by mouth every 6 (six) hours as needed (for pain.).   Yes [provider]  ADVAIR DISKUS 250-50 MCG/DOSE AEPB INHALE 1 PUFF INTO THE LUNGS TWICE DAILY 08/17/17  Yes Laverle Hobby, MD  Ascorbic Acid (VITAMIN C)  1000 MG tablet Take 1,000 mg by mouth daily.   Yes [provider]  Biotin 1000 MCG tablet Take by mouth.   Yes [provider]  Cholecalciferol (VITAMIN D-3) 5000 units TABS Take 5,000 Units by mouth every evening.   Yes [provider]  Cyanocobalamin (VITAMIN B-12) 3000 MCG SUBL Place 3,000 mcg under the tongue daily.   Yes [provider]  dexlansoprazole (DEXILANT) 60 MG capsule Take 1 capsule (60 mg total) by mouth daily. 01/15/17  Yes [name], MD  donepezil (ARICEPT) 5 MG tablet Take 5 mg by mouth at bedtime.   Yes [provider]  ELIQUIS 5 MG TABS tablet TAKE ONE TABLET TWICE DAILY 07/13/17  Yes Leonie Man, MD  ezetimibe (ZETIA) 10 MG tablet TAKE ONE TABLET EVERY DAY 03/18/17  Yes [name], MD  finasteride (PROSCAR) 5 MG tablet Take 5 mg by mouth daily. 07/15/16  Yes [provider]  fluticasone (FLONASE) 50 MCG/ACT nasal spray Place 2 sprays into both nostrils daily. Patient taking differently: Place 2 sprays into both nostrils daily as needed.  01/12/17  Yes [name], MD  glycopyrrolate (ROBINUL) 1 MG tablet Take 1 tablet (1 mg total) by mouth 3 (three) times daily as needed. 08/14/17  Yes [name], MD  lisinopril (PRINIVIL,ZESTRIL) 2.5 MG tablet TAKE ONE TABLET BY MOUTH TWICE DAILY 08/15/17  Yes [name], MD  magnesium oxide (MAG-OX) 400 MG tablet Take 400 mg by mouth every evening.    Yes [provider]  metoprolol tartrate (LOPRESSOR) 25 MG tablet TAKE ONE TABLET TWICE DAILY 05/14/17  Yes Leonie Man, MD  montelukast (SINGULAIR) 10 MG tablet TAKE ONE TABLET BY MOUTH AT BEDTIME 06/12/17  Yes [name], MD  Multiple Vitamins-Minerals (PRESERVISION AREDS 2 PO) Take 1 tablet by mouth 2 (two) times daily.   Yes [provider]  rosuvastatin (CRESTOR) 20 MG tablet TAKE ONE TABLET EVERY DAY 06/12/17  Yes [name], MD  tamsulosin (FLOMAX) 0.4 MG CAPS capsule  Take 0.4 mg by mouth daily. 07/15/16  Yes [provider]  albuterol (PROVENTIL HFA) 108 (90 Base) MCG/ACT inhaler Inhale into the lungs. 11/14/14   [provider]  Fluticasone-Salmeterol (ADVAIR DISKUS) 250-50 MCG/DOSE AEPB Inhale 1 puff into the lungs 2 (two) times daily. 09/12/16 09/12/17  Laverle Hobby, MD  ipratropium-albuterol (DUONEB) 0.5-2.5 (3) MG/3ML SOLN Inhale 3 mLs into the lungs every 4 (four) hours as needed for shortness of breath or wheezing. 12/19/16   [provider]  mirabegron ER (MYRBETRIQ) 25 MG TB24 tablet Take 25 mg by mouth every morning.     [provider]  nitroGLYCERIN (NITROSTAT) 0.4 MG SL tablet Place 1 tablet (0.4 mg total) under the tongue every 5 (five) minutes as needed for chest pain. 01/12/17   [name], MD  NON FORMULARY Nystatin elixir month wash  [provider]  Polyethyl Glycol-Propyl Glycol (SYSTANE) 0.4-0.3 % SOLN Place 1 drop into both eyes 3 (three) times daily as needed (FOR DRY EYES).    [provider]    Allergies as of 09/08/2017 - Review Complete 09/04/2017  Allergen Reaction Noted  . Clonazepam Other (See Comments) 11/16/2014  . Codeine Other (See Comments) 01/13/2013  . Niacin and related Other (See Comments) 01/13/2013  . Tramadol  12/22/2016  . Hydrocodone  12/26/2016  . Oxycodone Other (See Comments) 12/26/2016    Family History  Problem Relation Age of Onset  . Ulcers Mother        Peptic  . Dementia Mother   . Alcohol abuse Father     Social History   Socioeconomic History  . Marital status: Widowed    Spouse name: Not on file  . Number of children: Not on file  . Years of education: Not on file  . Highest education level: Not on file  Social Needs  . Financial resource strain: Not on file  . Food insecurity - worry: Not on file  . Food insecurity - inability: Not on file  . Transportation needs - medical: Not on file  . Transportation needs -  non-medical: Not on file  Occupational History  . Not on file  Tobacco Use  . Smoking status: Former Smoker    Types: Cigarettes    Last attempt to quit: 08/04/1997    Years since quitting: 20.1  . Smokeless tobacco: Never Used  Substance and Sexual Activity  . Alcohol use: No  . Drug use: No  . Sexual activity: Not on file  Other Topics Concern  . Not on file  Social History Narrative   He is a widowed father of one, grandfather of 26 with 3 stepchildren. He has been working out vigorously at [employer] the basis. He's up to doing 20 minutes on the stationary bike and 10-15 minutes on the treadmill. He doesn't smoke or drink.    Review of Systems: See HPI, otherwise negative ROS  Physical Exam: BP (!) 171/87   Pulse 86   Temp (!) 96.4 F (35.8 C) (Tympanic)   Resp 18   Wt 225 lb (102.1 kg)   SpO2 94%   BMI 30.52 kg/m  General:   Alert,  pleasant and cooperative in NAD Head:  Normocephalic and atraumatic. Neck:  Supple; no masses or thyromegaly. Lungs:  Clear throughout to auscultation.    Heart:  Regular rate and rhythm. Abdomen:  Soft, nontender and nondistended. Normal bowel sounds, without guarding, and without rebound.   Neurologic:  Alert and  oriented x4;  grossly normal neurologically.  Impression/Plan: AIRAM HEIDECKER is here for an endoscopy and colonoscopy to be performed for heme positive stools and melena. History of colon polyps  Risks, benefits, limitations, and alternatives regarding  endoscopy and colonoscopy have been reviewed with the patient.  Questions have been answered.  All parties agreeable.   Lucilla Lame, MD  09/15/2017, 9:20 AM

## 2017-09-15 NOTE — Transfer of Care (Signed)
Immediate Anesthesia Transfer of Care Note  Patient: [name]  Procedure(s) Performed: COLONOSCOPY WITH PROPOFOL (N/A ) ESOPHAGOGASTRODUODENOSCOPY (EGD) WITH PROPOFOL (N/A )  Patient Location: PACU  Anesthesia Type:General  Level of Consciousness: awake and alert   Airway & Oxygen Therapy: Patient Spontanous Breathing and Patient connected to nasal cannula oxygen  Post-op Assessment: Report given to RN and Post -op Vital signs reviewed and stable  Post vital signs: Reviewed and stable  Last Vitals:  Vitals:   09/15/17 0840 09/15/17 1009  BP: (!) 171/87 94/80  Pulse: 86 80  Resp: 18 19  Temp: (!) 35.8 C   SpO2: 94% 93%    Last Pain:  Vitals:   09/15/17 0840  TempSrc: Tympanic         Complications: No apparent anesthesia complications

## 2017-09-15 NOTE — Op Note (Signed)
[facility] Gastroenterology Patient Name: [name] Procedure Date: 09/15/2017 9:25 AM MRN: [id number] Account #: [id number] Date of Birth: [birth year] Admit Type: Outpatient Age: 84 Room: [facility] ENDO ROOM 4 Gender: Male Note Status: Finalized Procedure:            Upper GI endoscopy Indications:          Heme positive stool, Melena Providers:            Lucilla Lame MD, MD Referring MD:         Kirstie Peri. Caryn Section, MD (Referring MD) Medicines:            Propofol per Anesthesia Complications:        No immediate complications. Procedure:            Pre-Anesthesia Assessment:                       - Prior to the procedure, a History and Physical was                        performed, and patient medications and allergies were                        reviewed. The patient's tolerance of previous                        anesthesia was also reviewed. The risks and benefits of                        the procedure and the sedation options and risks were                        discussed with the patient. All questions were                        answered, and informed consent was obtained. Prior                        Anticoagulants: The patient has taken no previous                        anticoagulant or antiplatelet agents. ASA Grade                        Assessment: II - A patient with mild systemic disease.                        After reviewing the risks and benefits, the patient was                        deemed in satisfactory condition to undergo the                        procedure.                       After obtaining informed consent, the endoscope was                        passed under direct vision. Throughout the procedure,  the patient's blood pressure, pulse, and oxygen                        saturations were monitored continuously. The Endoscope                        was introduced through the mouth, and advanced to the                 second part of duodenum. The upper GI endoscopy was                        accomplished without difficulty. The patient tolerated                        the procedure well. Findings:      A medium-sized hiatal hernia was present.      One mild benign-appearing, intrinsic stenosis was found at the       gastroesophageal junction. And was traversed. A TTS dilator was passed       through the scope. Dilation with a 15-16.5-18 mm balloon dilator was       performed to 18 mm. The dilation site was examined following endoscope       reinsertion and showed complete resolution of luminal narrowing.      The stomach was normal.      The examined duodenum was normal. Impression:           - Medium-sized hiatal hernia.                       - Benign-appearing esophageal stenosis. Dilated.                       - Normal stomach.                       - Normal examined duodenum.                       - No specimens collected. Recommendation:       - Perform a colonoscopy today. Procedure Code(s):    --- Professional ---                       [phone], Esophagogastroduodenoscopy, flexible, transoral;                        with transendoscopic balloon dilation of esophagus                        (less than 30 mm diameter) Diagnosis Code(s):    --- Professional ---                       K92.1, Melena (includes Hematochezia)                       R19.5, Other fecal abnormalities                       K22.2, Esophageal obstruction CPT copyright 2016 American Medical Association. All rights reserved. The codes documented in this report are preliminary and upon coder review may  be revised to meet current compliance requirements. Lucilla Lame MD, MD 09/15/2017 9:41:01  AM This report has been signed electronically. Number of Addenda: 0 Note Initiated On: 09/15/2017 9:25 AM      [facility]

## 2017-09-16 ENCOUNTER — Encounter: Payer: Self-pay | Admitting: Gastroenterology

## 2017-09-17 LAB — SURGICAL PATHOLOGY

## 2017-09-21 ENCOUNTER — Telehealth: Payer: Self-pay

## 2017-09-21 NOTE — Telephone Encounter (Signed)
Pt's friend, [name] has been notified to stop Eliquis per Dr. Allison Quarry recommendation.

## 2017-09-21 NOTE — Telephone Encounter (Signed)
Author: Leonie Man, MD Service: Cardiology Author Type: Physician  Filed: 09/07/2017 5:59 PM Encounter Date: 09/07/2017 Status: Signed  Editor: Leonie Man, MD (Physician)      [name] has paroxysmal atrial fibrillation on Eliquis.  He is okay for him to stop Eliquis 24-48 hours prior to procedure.  Restart 1-2 days postop.  [name], MD

## 2017-09-21 NOTE — Telephone Encounter (Signed)
-----   Message from Leonie Man, MD sent at 09/07/2017  5:59 PM EST -----   ----- Message ----- From: Glennie [place], CMA Sent: 09/07/2017  10:06 AM To: Leonie Man, MD

## 2017-09-22 ENCOUNTER — Telehealth: Payer: Self-pay

## 2017-09-22 NOTE — Telephone Encounter (Signed)
-----   Message from Lucilla Lame, MD sent at 09/22/2017  8:45 AM EST ----- The patient was told of the results when he came for his wife's colonoscopy. The patient will think about whether he wants undergo surgery and contact us if he does.

## 2017-09-22 NOTE — Telephone Encounter (Signed)
-----   Message from Lucilla Lame, MD sent at 09/17/2017  6:41 PM EST ----- This patient needs to come in to review his pathology there were worrisome findings on his biopsy

## 2017-09-29 DIAGNOSIS — H353122 Nonexudative age-related macular degeneration, left eye, intermediate dry stage: Secondary | ICD-10-CM | POA: Diagnosis not present

## 2017-10-08 DIAGNOSIS — M7752 Other enthesopathy of left foot: Secondary | ICD-10-CM | POA: Diagnosis not present

## 2017-10-08 DIAGNOSIS — Q6689 Other  specified congenital deformities of feet: Secondary | ICD-10-CM | POA: Diagnosis not present

## 2017-10-19 DIAGNOSIS — R972 Elevated prostate specific antigen [PSA]: Secondary | ICD-10-CM | POA: Diagnosis not present

## 2017-10-28 DIAGNOSIS — R972 Elevated prostate specific antigen [PSA]: Secondary | ICD-10-CM | POA: Diagnosis not present

## 2017-10-29 ENCOUNTER — Ambulatory Visit (INDEPENDENT_AMBULATORY_CARE_PROVIDER_SITE_OTHER)
Admission: RE | Admit: 2017-10-29 | Discharge: 2017-10-29 | Disposition: A | Discharge status: Home / Self Care | Payer: Medicare Other | Source: Ambulatory Visit | Attending: Vascular Surgery | Admitting: Vascular Surgery

## 2017-10-29 ENCOUNTER — Encounter: Payer: Self-pay | Admitting: Family

## 2017-10-29 ENCOUNTER — Ambulatory Visit (HOSPITAL_COMMUNITY)
Admission: RE | Admit: 2017-10-29 | Discharge: 2017-10-29 | Disposition: A | Discharge status: Home / Self Care | Payer: Medicare Other | Source: Ambulatory Visit | Attending: Vascular Surgery | Admitting: Vascular Surgery

## 2017-10-29 ENCOUNTER — Other Ambulatory Visit: Payer: Self-pay

## 2017-10-29 ENCOUNTER — Ambulatory Visit (INDEPENDENT_AMBULATORY_CARE_PROVIDER_SITE_OTHER): Payer: Medicare Other | Admitting: Family

## 2017-10-29 VITALS — BP 146/81 | HR 57 | Temp 97.5°F | Resp 16 | Ht 72.0 in | Wt 226.0 lb

## 2017-10-29 DIAGNOSIS — Z95828 Presence of other vascular implants and grafts: Secondary | ICD-10-CM | POA: Diagnosis not present

## 2017-10-29 DIAGNOSIS — I779 Disorder of arteries and arterioles, unspecified: Secondary | ICD-10-CM

## 2017-10-29 DIAGNOSIS — I6523 Occlusion and stenosis of bilateral carotid arteries: Secondary | ICD-10-CM | POA: Insufficient documentation

## 2017-10-29 DIAGNOSIS — I714 Abdominal aortic aneurysm, without rupture, unspecified: Secondary | ICD-10-CM

## 2017-10-29 DIAGNOSIS — Z9889 Other specified postprocedural states: Secondary | ICD-10-CM | POA: Insufficient documentation

## 2017-10-29 DIAGNOSIS — I771 Stricture of artery: Secondary | ICD-10-CM

## 2017-10-29 LAB — VAS US CAROTID
LEFT ECA DIAS: -3 cm/s
LEFT VERTEBRAL DIAS: 9 cm/s
Left CCA dist dias: 11 cm/s
Left CCA dist sys: 71 cm/s
Left CCA prox dias: 13 cm/s
Left CCA prox sys: 67 cm/s
Left ICA dist dias: -13 cm/s
Left ICA dist sys: -55 cm/s
Left ICA prox dias: -15 cm/s
Left ICA prox sys: -63 cm/s
RIGHT CCA MID DIAS: 14 cm/s
RIGHT ECA DIAS: -14 cm/s
RIGHT VERTEBRAL DIAS: 9 cm/s
Right CCA prox dias: 11 cm/s
Right CCA prox sys: 78 cm/s
Right cca dist sys: -70 cm/s

## 2017-10-29 NOTE — Patient Instructions (Addendum)
Before your next abdominal ultrasound:  Take two Extra-Strength Gas-X capsules at bedtime the night before the test. Take another two Extra-Strength Gas-X capsules 3 hours before the test.  Avoid gas forming foods the day before the test.        Peripheral Vascular Disease Peripheral vascular disease (PVD) is a disease of the blood vessels that are not part of your heart and brain. A simple term for PVD is poor circulation. In most cases, PVD narrows the blood vessels that carry blood from your heart to the rest of your body. This can result in a decreased supply of blood to your arms, legs, and internal organs, like your stomach or kidneys. However, it most often affects a person's lower legs and feet. There are two types of PVD.  Organic PVD. This is the more common type. It is caused by damage to the structure of blood vessels.  Functional PVD. This is caused by conditions that make blood vessels contract and tighten (spasm).  Without treatment, PVD tends to get worse over time. PVD can also lead to acute ischemic limb. This is when an arm or limb suddenly has trouble getting enough blood. This is a medical emergency. Follow these instructions at home:  Take medicines only as told by your doctor.  Do not use any tobacco products, including cigarettes, chewing tobacco, or electronic cigarettes. If you need help quitting, ask your doctor.  Lose weight if you are overweight, and maintain a healthy weight as told by your doctor.  Eat a diet that is low in fat and cholesterol. If you need help, ask your doctor.  Exercise regularly. Ask your doctor for some good activities for you.  Take good care of your feet. ? Wear comfortable shoes that fit well. ? Check your feet often for any cuts or sores. Contact a doctor if:  You have cramps in your legs while walking.  You have leg pain when you are at rest.  You have coldness in a leg or foot.  Your skin changes.  You are unable to  get or have an erection (erectile dysfunction).  You have cuts or sores on your feet that are not healing. Get help right away if:  Your arm or leg turns cold and blue.  Your arms or legs become red, warm, swollen, painful, or numb.  You have chest pain or trouble breathing.  You suddenly have weakness in your face, arm, or leg.  You become very confused or you cannot speak.  You suddenly have a very bad headache.  You suddenly cannot see. This information is not intended to replace advice given to you by your health care provider. Make sure you discuss any questions you have with your health care provider. Document Released: 11/12/2009 Document Revised: 01/24/2016 Document Reviewed: 01/26/2014 Elsevier Interactive Patient Education  2017 Elsevier Inc.     Stroke Prevention Some health problems and behaviors may make it more likely for you to have a stroke. Below are ways to lessen your risk of having a stroke.  Be active for at least 30 minutes on most or all days.  Do not smoke. Try not to be around others who smoke.  Do not drink too much alcohol. ? Do not have more than 2 drinks a day if you are a man. ? Do not have more than 1 drink a day if you are a woman and are not pregnant.  Eat healthy foods, such as fruits and vegetables. If you were   put on a specific diet, follow the diet as told.  Keep your cholesterol levels under control through diet and medicines. Look for foods that are low in saturated fat, trans fat, cholesterol, and are high in fiber.  If you have diabetes, follow all diet plans and take your medicine as told.  Ask your doctor if you need treatment to lower your blood pressure. If you have high blood pressure (hypertension), follow all diet plans and take your medicine as told by your doctor.  If you are 18-39 years old, have your blood pressure checked every 3-5 years. If you are age 40 or older, have your blood pressure checked every year.  Keep a  healthy weight. Eat foods that are low in calories, salt, saturated fat, trans fat, and cholesterol.  Do not take drugs.  Avoid birth control pills, if this applies. Talk to your doctor about the risks of taking birth control pills.  Talk to your doctor if you have sleep problems (sleep apnea).  Take all medicine as told by your doctor. ? You may be told to take aspirin or blood thinner medicine. Take this medicine as told by your doctor. ? Understand your medicine instructions.  Make sure any other conditions you have are being taken care of.  Get help right away if:  You suddenly lose feeling (you feel numb) or have weakness in your face, arm, or leg.  Your face or eyelid hangs down to one side.  You suddenly feel confused.  You have trouble talking (aphasia) or understanding what people are saying.  You suddenly have trouble seeing in one or both eyes.  You suddenly have trouble walking.  You are dizzy.  You lose your balance or your movements are clumsy (uncoordinated).  You suddenly have a very bad headache and you do not know the cause.  You have new chest pain.  Your heart feels like it is fluttering or skipping a beat (irregular heartbeat). Do not wait to see if the symptoms above go away. Get help right away. Call your local emergency services (911 in [place] Do not drive yourself to the hospital. This information is not intended to replace advice given to you by your health care provider. Make sure you discuss any questions you have with your health care provider. Document Released: 02/17/2012 Document Revised: 01/24/2016 Document Reviewed: 02/18/2013 Elsevier Interactive Patient Education  2018 Elsevier Inc.     

## 2017-10-29 NOTE — Progress Notes (Addendum)
[name] [place] HISTORY [name] PHYSICAL   CC: Follow up extracranial carotid artery Foster, [name] Foster,  [name] peripheral artery occlusive disease    History of Present Illness:   [name] Foster is a 84 y.o. male who is status post left carotid endarterectomy in 2007 by Dr. Oneida Alar.  He has also previously had stenting of the left [name] right common iliac arteries. He returns today for follow up of his extracranial carotid artery Foster, [name] Foster, [name] PAOD.  He had some complaints in his lower extremities in the past but no longer c/o claudication.  He is currently on aspirin once daily. He denies any claudication symptoms at this point. However he states he has been diagnosed with sacroiliac joint problems [name] has some fatigue in his lower extremities with walking long distances. Chronic medical problems include hypertension, hyperlipidemia coronary artery disease all of which are currently stable.   He denies abdominal pain, states he only has back pain if "I over do it".   Pt states he was told that he might have had a couple of TIA's about 2013.  He has macular degeneration.  He had a right wrist ORIF July 2016. Diagnosed with a-fib [name] uncontrolled hypertension January 2016, hospitalized for 1 day at [facility].  He has a remote hx of left ankle fracture [name] repair.   He is using CPAP.  He walks on a treadmill 4-5 days/week, [name] does other exercises in a gym.  Pt has CAD, history of atrial fib, his cardiologist is Dr. [name]. Dr. Stevenson Clinch, in [place], is his pulmonologist.   Pt Diabetic: No Pt smoker: former smoker, quit in 1998  Pt meds include: Statin : Yes ASA: No Other anticoagulants/antiplatelets: Eliquis, history of atrial fib   Current Outpatient Medications  Medication Sig Dispense Refill  . acetaminophen (TYLENOL) 500 MG tablet Take 500-1,000 mg by mouth every 6 (six) hours as needed (for pain.).    [name] ADVAIR DISKUS  250-50 MCG/DOSE AEPB INHALE 1 PUFF INTO THE LUNGS TWICE DAILY 60 each 5  . albuterol (PROVENTIL HFA) 108 (90 Base) MCG/ACT inhaler Inhale into the lungs.    . Ascorbic Acid (VITAMIN C) 1000 MG tablet Take 1,000 mg by mouth daily.    . Cholecalciferol (VITAMIN D-3) 5000 units TABS Take 5,000 Units by mouth every evening.    . Cyanocobalamin (VITAMIN B-12) 3000 MCG SUBL Place 3,000 mcg under the tongue daily.    [name] dexlansoprazole (DEXILANT) 60 MG capsule Take 1 capsule (60 mg total) by mouth daily. 30 capsule 11  . donepezil (ARICEPT) 5 MG tablet Take 5 mg by mouth at bedtime.    [name] ELIQUIS 5 MG TABS tablet TAKE ONE TABLET TWICE DAILY 180 tablet 1  . ezetimibe (ZETIA) 10 MG tablet TAKE ONE TABLET EVERY DAY 90 tablet 4  . finasteride (PROSCAR) 5 MG tablet Take 5 mg by mouth daily.    . fluticasone (FLONASE) 50 MCG/ACT nasal spray Place 2 sprays into both nostrils daily. (Patient taking differently: Place 2 sprays into both nostrils daily as needed. ) 16 g 6  . glycopyrrolate (ROBINUL) 1 MG tablet Take 1 tablet (1 mg total) by mouth 3 (three) times daily as needed. 60 tablet 3  . lisinopril (PRINIVIL,ZESTRIL) 2.5 MG tablet TAKE ONE TABLET BY MOUTH TWICE DAILY 180 tablet 4  . magnesium oxide (MAG-OX) 400 MG tablet Take 400 mg by mouth every evening.     . metoprolol tartrate (LOPRESSOR) 25 MG tablet TAKE ONE TABLET  TWICE DAILY 180 tablet 2  . mirabegron ER (MYRBETRIQ) 25 MG TB24 tablet Take 25 mg by mouth every morning.     . nitroGLYCERIN (NITROSTAT) 0.4 MG SL tablet Place 1 tablet (0.4 mg total) under the tongue every 5 (five) minutes as needed for chest pain. 20 tablet 1  . NON FORMULARY Nystatin elixir month wash    . rosuvastatin (CRESTOR) 20 MG tablet TAKE ONE TABLET EVERY DAY 90 tablet 4  . tamsulosin (FLOMAX) 0.4 MG CAPS capsule Take 0.4 mg by mouth daily.    . Fluticasone-Salmeterol (ADVAIR DISKUS) 250-50 MCG/DOSE AEPB Inhale 1 puff into the lungs 2 (two) times daily. 3 each 3   No current  facility-administered medications for this visit.     Past Medical History:  Diagnosis Date  . Arthritis   . Basal cell carcinoma of eyelid 01/03/2013  . Cancer [facility])    Skin cancer  . Colon polyps   . COPD (chronic obstructive pulmonary disease) ([place])   . Coronary artery disease   . Dysrhythmia    afib  . GERD (gastroesophageal reflux disease)   . Hemorrhoid 02/10/2015  . History of hiatal hernia   . HOH (hard of hearing)   . Hypertension   . Memory loss   . Osteoporosis   . Prostate disorder   . Sciatic pain    Chronic  . Sleep apnea    cpap  . Temporary cerebral [name] dysfunction 02/10/2015   Had negative work-up.  July, 2012.  No medication changes, had forgot them that day, got dehydrated.     Social History Social History   Tobacco Use  . Smoking status: Former Smoker    Types: Cigarettes    Last attempt to quit: 08/04/1997    Years since quitting: 20.2  . Smokeless tobacco: Never Used  Substance Use Topics  . Alcohol use: No  . Drug use: No    Family History Family History  Problem Relation Age of Onset  . Ulcers Mother        Peptic  . Dementia Mother   . Alcohol abuse Father     Surgical History Past Surgical History:  Procedure Laterality Date  . ANKLE SURGERY    . Arch aortogram [name] carotid aortogram  06/02/2006   Dr Oneida Alar did surgery  . BASAL CELL CARCINOMA EXCISION  04/2016   Dermatology  . CARDIAC CATHETERIZATION  01/30/1998     total native LAD andRCA ,mod native CIRC; svg to RCA [name] OBTUSE MARG. PATENT the left internal mammary artery graft to LAD ; LV NORMAL  /  . CAROTID ENDARTERECTOMY Left Oct. 29, 2007   Dr. Oneida Alar:  . CATARACT EXTRACTION W/PHACO Right 03/05/2017   Procedure: CATARACT EXTRACTION PHACO [name] INTRAOCULAR LENS PLACEMENT (IOC);  Surgeon: Leandrew Koyanagi, MD;  Location: [facility] ORS;  Service: Ophthalmology;  Laterality: Right;  [place] 00:58.5 AP% 10.6 CDE 6.20 Fluid Pack lot # [id number]  . CATARACT EXTRACTION W/PHACO Left  04/15/2017   Procedure: CATARACT EXTRACTION PHACO [name] INTRAOCULAR LENS PLACEMENT ([place])  Left;  Surgeon: Leandrew Koyanagi, MD;  Location: [facility];  Service: Ophthalmology;  Laterality: Left;  . COLONOSCOPY WITH PROPOFOL N/A 09/15/2017   Procedure: COLONOSCOPY WITH PROPOFOL;  Surgeon: Lucilla Lame, MD;  Location[facility] ENDOSCOPY;  Service: Endoscopy;  Laterality: N/A;  . CORONARY ANGIOPLASTY     stent  . CORONARY ARTERY BYPASS GRAFT  1998   LIMA-LAD, SVG-OM, SVG-RPDA, SVG-DIAG  . CPET / MET  12/2012   Mild chronotropic incompetence -  read 82% of predicted; also reduced effort; peak VO2 15.7 / 75% (did not reach Max effort) -- suggested ischemic response in last 1.5 minutes of exercise. Normal pulmonary function on PFTs but poor response to  . DOPPLER ECHOCARDIOGRAPHY  11/20/2010   EF =>55%; LV norm mild aortic scelorosis  . ESOPHAGOGASTRODUODENOSCOPY (EGD) WITH PROPOFOL N/A 09/15/2017   Procedure: ESOPHAGOGASTRODUODENOSCOPY (EGD) WITH PROPOFOL;  Surgeon: Lucilla Lame, MD;  Location: [facility] ENDOSCOPY;  Service: Endoscopy;  Laterality: N/A;  . FRACTURE SURGERY Right 03/19/2015   wrist  . HIATAL HERNIA REPAIR    . ILIAC ARTERY STENT  12/15/2005   PTA [name] direct stenting rgt [name] lft common iliac arteries  . NM MYOCAR PERF WALL MOTION  11/11/2010; February 2016   a) TM STRESS----NORMAL PERFUSION ,EF 68%; b) EF 50-55%. Normal LV function. No significant ischemia or infarction.  . ORIF ELBOW FRACTURE Right 01/01/2017   Procedure: OPEN REDUCTION INTERNAL FIXATION (ORIF) ELBOW/OLECRANON FRACTURE;  Surgeon: Hessie Knows, MD;  Location: [facility] ORS;  Service: Orthopedics;  Laterality: Right;  . ORIF WRIST FRACTURE Right 03/19/2015   Procedure: OPEN REDUCTION INTERNAL FIXATION (ORIF) WRIST FRACTURE;  Surgeon: Hessie Knows, MD;  Location: [facility] ORS;  Service: Orthopedics;  Laterality: Right;  . THORACIC AORTA - CAROTID ANGIOGRAM  October 2007   Dr. 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 TRANSTHORACIC ECHOCARDIOGRAM  February 2016   [facility]: Normal LV function. Dilated left atrium.    Allergies  Allergen Reactions  . Clonazepam Other (See Comments)    Altered mental status  . Codeine Other (See Comments)    Altered mental status.  . Niacin [name] Related Other (See Comments)    Flushing of skin  . Tramadol     Other reaction(s): Hallucination  . Hydrocodone     confusion  . Oxycodone Other (See Comments)    confusion confusion    Current Outpatient Medications  Medication Sig Dispense Refill  . acetaminophen (TYLENOL) 500 MG tablet Take 500-1,000 mg by mouth every 6 (six) hours as needed (for pain.).    [name] ADVAIR DISKUS 250-50 MCG/DOSE AEPB INHALE 1 PUFF INTO THE LUNGS TWICE DAILY 60 each 5  . albuterol (PROVENTIL HFA) 108 (90 Base) MCG/ACT inhaler Inhale into the lungs.    . Ascorbic Acid (VITAMIN C) 1000 MG tablet Take 1,000 mg by mouth daily.    . Cholecalciferol (VITAMIN D-3) 5000 units TABS Take 5,000 Units by mouth every evening.    . Cyanocobalamin (VITAMIN B-12) 3000 MCG SUBL Place 3,000 mcg under the tongue daily.    [name] dexlansoprazole (DEXILANT) 60 MG capsule Take 1 capsule (60 mg total) by mouth daily. 30 capsule 11  . donepezil (ARICEPT) 5 MG tablet Take 5 mg by mouth at bedtime.    [name] ELIQUIS 5 MG TABS tablet TAKE ONE TABLET TWICE DAILY 180 tablet 1  . ezetimibe (ZETIA) 10 MG tablet TAKE ONE TABLET EVERY DAY 90 tablet 4  . finasteride (PROSCAR) 5 MG tablet Take 5 mg by mouth daily.    . fluticasone (FLONASE) 50 MCG/ACT nasal spray Place 2 sprays into both nostrils daily. (Patient taking differently: Place 2 sprays into both nostrils daily as needed. ) 16 g 6  . glycopyrrolate (ROBINUL) 1 MG tablet Take 1 tablet (1 mg total) by mouth 3 (three) times daily as needed. 60 tablet 3  . lisinopril (PRINIVIL,ZESTRIL) 2.5 MG tablet TAKE ONE TABLET BY MOUTH TWICE DAILY 180 tablet  4  .  magnesium oxide (MAG-OX) 400 MG tablet Take 400 mg by mouth every evening.     . metoprolol tartrate (LOPRESSOR) 25 MG tablet TAKE ONE TABLET TWICE DAILY 180 tablet 2  . mirabegron ER (MYRBETRIQ) 25 MG TB24 tablet Take 25 mg by mouth every morning.     . nitroGLYCERIN (NITROSTAT) 0.4 MG SL tablet Place 1 tablet (0.4 mg total) under the tongue every 5 (five) minutes as needed for chest pain. 20 tablet 1  . NON FORMULARY Nystatin elixir month wash    . rosuvastatin (CRESTOR) 20 MG tablet TAKE ONE TABLET EVERY DAY 90 tablet 4  . tamsulosin (FLOMAX) 0.4 MG CAPS capsule Take 0.4 mg by mouth daily.    . Fluticasone-Salmeterol (ADVAIR DISKUS) 250-50 MCG/DOSE AEPB Inhale 1 puff into the lungs 2 (two) times daily. 3 each 3   No current facility-administered medications for this visit.      REVIEW OF SYSTEMS: See HPI for pertinent positives [name] negatives.  Physical Examination Vitals:   10/29/17 1111 10/29/17 1115  BP: (!) 141/85 (!) 146/81  Pulse: (!) 57   Resp: 16   Temp: (!) 97.5 F (36.4 C)   TempSrc: Oral   SpO2: 96%   Weight: 226 lb (102.5 kg)   Height: 6' (1.829 m)    Body mass index is 30.65 kg/m.  General:  WDWN obese male in NAD, accompanied by male friend GAIT:normal HENT: no gross abnormalities  Eyes: PERRLA Pulmonary: Respirations are non-labored, CTAB except transient rales in left posterior fields. Cardiac: Irregular rhythm, no detected murmur.  [name] EXAM Carotid Bruits Right Left   Negative Negative   Abdominal aortic pulse is not palpable. Radial pulses are 2+ palpable [name] equal.      LE Pulses Right Left  FEMORAL not palpable(obese) not palpable   POPLITEAL not palpable  not palpable   POSTERIOR TIBIAL not palpable  not palpable    DORSALIS PEDIS  ANTERIOR TIBIAL 1+  palpable  Not palpable    Gastrointestinal: soft, nontender, BS WNL, no r/g, no palpated masses. Musculoskeletal: No muscle atrophy/wasting. M/S 5/5 throughout, Extremities without ischemic changes. Dermatologic: No rash, no cellulitis, no ulcers noted.  Neurologic: A&O X 3; Appropriate Affect, sensation is normal, speech is normal, CN 2-12 intactexcept has some hearing loss, Pain [name] light touch intact in extremities, Motor exam as listed above Psychiatric: Normal thought content, mood appropriate to clinical situation.    ASSESSMENT:  KARANDEEP RESENDE is a 84 y.o. male who is status post left carotid endarterectomy in 2007.  He has also previously had stenting of the left [name] right common iliac arteries. Pt states he was told that he might have had a couple of TIA's about 2013.   He does not have claudication sx's with walking but he states he has been diagnosed with sacroiliac joint problems [name] has some fatigue in his lower extremities with walking long distances. He has no ischemic changes in his feet/legs. He has chronic back pain, no new back pain, no abdominal pain.     DATA  Carotid duplex (10/29/17): 40 - 59% right internal carotid artery Foster. Patent left carotid endarterectomy site with no evidence for restenosis. Bilateral vertebral artery flow is antegrade.  Bilateral subclavian artery waveforms are normal.  No significant change since exams of 10-19-2014, 10-11-15, [name] 10-23-16.   Bilateral Iliac Artery Stent Duplex (10/29/17): Right CIA stent with 50-99% Foster at proximal (243 cm/c) [name] distal (232 cm/s) segments, all biphasic waveforms. Lower end  of range, unable to visualize plaque, all biphasic waveforms.   Left CIA stent with no significant Foster. More Foster in the right CIA stent [name] less Foster in the left CA stent compared to the exam on 10-23-16.  The largest abdominal aortic diameter visualized: 3.89 cm x 3.52 cm.   Decreased  visualization of the abdominal vasculature due to overlying bowel gas, patient body habitus.   10-23-16 [name] Foster duplex: 4.3 cm aortic aneurysm, increased from 4.0 on 10-11-15.    ABI (Date: 10/29/2017):  R:   ABI: 1.08 (was 0.95 on 10-23-16),   PT: bi  DP: tri  TBI:  0.67 (was 0.72)  L:   ABI: 1.02 (was 0.99),   PT: bi  DP: mono  TBI: 0.64 (was 0.62)  Slightly improved on the right with normal ABI, bi [name] triphasic waveforms, stable on the left with normal ABI, bi [name] monophasic waveforms.    PLAN:   Graduated walking program discussed [name] how to achieve. Based on today's exam [name] non-invasive [name] lab results, the patient will follow up in 1 year with Carotid Duplex scan, bilateral aortoiliac artery duplex, [name] ABI's.  I advised pt to notify us if he develops concerns re the circulation in his buttocks, feet or legs.   I discussed in depth with the patient the nature of atherosclerosis, [name] emphasized the importance of maximal medical management including strict control of blood pressure, blood glucose, [name] lipid levels, obtaining regular exercise, [name] cessation of smoking.  The patient is aware that without maximal medical management the underlying atherosclerotic disease process will progress, limiting the benefit of any interventions.  Consideration for repair of [name] Foster would be made when the size approaches 5.0 cm, growth > 1 cm/yr, [name] symptomatic status. The patient was given information about [name] Foster including signs, symptoms, treatment,  what symptoms should prompt the patient to seek immediate medical care, [name] how to minimize the risk of enlargement [name] rupture of aneurysms.   The patient was given information about stroke prevention [name] what symptoms should prompt the patient to seek immediate medical care.  The patient was given information about PAD including signs, symptoms, treatment, what symptoms should prompt the patient to seek immediate medical care, [name] risk  reduction measures to take.  Thank you for allowing [place] to participate in this patient's care.  Clemon Chambers, RN, MSN, FNP-C [name] Office: [phone]  Clinic MD:  10/29/2017 11:30 AM

## 2017-11-02 DIAGNOSIS — M75101 Unspecified rotator cuff tear or rupture of right shoulder, not specified as traumatic: Secondary | ICD-10-CM | POA: Diagnosis not present

## 2017-11-02 DIAGNOSIS — M19011 Primary osteoarthritis, right shoulder: Secondary | ICD-10-CM | POA: Diagnosis not present

## 2017-11-02 DIAGNOSIS — M25511 Pain in right shoulder: Secondary | ICD-10-CM | POA: Diagnosis not present

## 2017-11-18 DIAGNOSIS — K117 Disturbances of salivary secretion: Secondary | ICD-10-CM | POA: Diagnosis not present

## 2017-11-18 DIAGNOSIS — G3184 Mild cognitive impairment, so stated: Secondary | ICD-10-CM | POA: Diagnosis not present

## 2017-11-18 DIAGNOSIS — Z9989 Dependence on other enabling machines and devices: Secondary | ICD-10-CM | POA: Diagnosis not present

## 2017-11-18 DIAGNOSIS — G4733 Obstructive sleep apnea (adult) (pediatric): Secondary | ICD-10-CM | POA: Diagnosis not present

## 2017-11-29 NOTE — Progress Notes (Signed)
* [facility]     Assessment and Plan:  Severe Obstructive sleep apnea.  -Auto CPAP with pressure settings of 10-20  -Residual AHI most recent download is 3.1 -Continues to have trouble keeping on the mask but appears to have improved over the last week with the addition of amitriptyline.  Periodic Limb Movements in Sleep.  -Sleep study showed severe periodic limb movements, these persisted on the patient's titration study with an arousal index of 5. -ferritin level, 08/01/16; 46 (normal).   Insomnia. -improved, currently on amitriptyline which appears to be helping with sleep and CPAP tolerance.    Interstitial lung disease. -Doubt NSIP, changes are likely related to underlying emphysema. No active treatment required at this time.  COPD/Emphysema.  -Significant emphysematous changes seen on CT chest. -Continue Advair. -The patient is encouraged to try to increase his physical activity level.  Lung nodule.  -Right upper lobe granuloma, calcified. No further follow-up needed.  Atrial fibrillation.  -Currently on Eliquis for atrial fibrillation, continue to follow with cardiology.   Weakness/deconditioning -As above secondary to multiple comorbidities, patient has to try to increase his physical activity level in a safe manner.  Date: 11/29/2017  MRN# [id number] [name] [birth year]   [name] is a 84 y.o. old male seen in follow up for chief complaint of  Chief Complaint  Patient presents with  . Follow-up  . Sleep Apnea    wearing 8-9 hrs nightly; doing well on CPAP     HPI:   The patient has a history OSA and COPD. He has been through several masks in the past, he has been to the mask fitting clinic, he continues to have difficulty using his mask. He is here with his girlfriend, she gives some of the history but lives separately. He continue to have difficulty putting on the mask. Most of the nights he puts it on he wears it for the  whole night, but he appears that he does not put it on.   He has continued on advair twice daily, on most days but often forgets doses, he feels that his breathing has been doing well with it.   **Review of download data for 1 month as a 11/26/17.  Uses greater than 4 hours is 20/30 days.  Average usage on days used is 5 hours 44 minutes.  Auto set pressure 10-20.  Median pressure 5, 95th percentile pressure is 9, maximum pressure is 11.  Residual AHI is 3.1.  **Review of his recent sleep titration study showed that his OSA was inadequately treated at the current level, and his PLMI and arousal index remained elevated at 5. At last visit his CPAP was changed to auto 10-20.  **Review of download data 30 days as of 05/24/17; Uses greater than 4 hours is 18 days, average usage on days used was 4 hours 57 minutes, sit pressors 8, residual AHI is 15.9, central apnea index is 1.7.  Review and summary of previous testing: --PFT 11/29/14; FVC=93%; Fev1=103%; ratio=80%. RV=58%, TLC=76%, rv/tlc=71%; Diffusion=37%; appears consistent with restriction.  --The patient has a history of OSA and COPD. Review of sleep study on 05/14/11 showed AHI of 6.9 with RDI of 41. Sleep efficiency was 53% and latency was short at 8 min. PLMI was very elevated at 85 with an arousal index of 9.  --Titration study on 12/12/14; sleep latency of 11 min; with sleep efficiency of 50%, AHI=40. Cpap titrated from 4-14; CPAP of 12 was recommended, however the  patient continued to have elevated of 26 at pressure of 14.   Ct images; 12/15/15; 02/21/15--  There is persistent emphysematous changes, greatest in upper lobes. Stable RUL calcified granuloma. 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 Per report it is unchanged, but to my eye it appears that it is advancing. Slight mediastinal lymphadenopathy, paratracheal areas.    Medication:   Outpatient Encounter Medications as of 11/30/2017  Medication Sig    . acetaminophen (TYLENOL) 500 MG tablet Take 500-1,000 mg by mouth every 6 (six) hours as needed (for pain.).  [name] ADVAIR DISKUS 250-50 MCG/DOSE AEPB INHALE 1 PUFF INTO THE LUNGS TWICE DAILY  . albuterol (PROVENTIL HFA) 108 (90 Base) MCG/ACT inhaler Inhale into the lungs.  . Ascorbic Acid (VITAMIN C) 1000 MG tablet Take 1,000 mg by mouth daily.  . Cholecalciferol (VITAMIN D-3) 5000 units TABS Take 5,000 Units by mouth every evening.  . Cyanocobalamin (VITAMIN B-12) 3000 MCG SUBL Place 3,000 mcg under the tongue daily.  [name] dexlansoprazole (DEXILANT) 60 MG capsule Take 1 capsule (60 mg total) by mouth daily.  [name] donepezil (ARICEPT) 5 MG tablet Take 5 mg by mouth at bedtime.  [name] ELIQUIS 5 MG TABS tablet TAKE ONE TABLET TWICE DAILY  . ezetimibe (ZETIA) 10 MG tablet TAKE ONE TABLET EVERY DAY  . finasteride (PROSCAR) 5 MG tablet Take 5 mg by mouth daily.  . fluticasone (FLONASE) 50 MCG/ACT nasal spray Place 2 sprays into both nostrils daily. (Patient taking differently: Place 2 sprays into both nostrils daily as needed. )  . Fluticasone-Salmeterol (ADVAIR DISKUS) 250-50 MCG/DOSE AEPB Inhale 1 puff into the lungs 2 (two) times daily.  [name] glycopyrrolate (ROBINUL) 1 MG tablet Take 1 tablet (1 mg total) by mouth 3 (three) times daily as needed.  [name] lisinopril (PRINIVIL,ZESTRIL) 2.5 MG tablet TAKE ONE TABLET BY MOUTH TWICE DAILY  . magnesium oxide (MAG-OX) 400 MG tablet Take 400 mg by mouth every evening.   . metoprolol tartrate (LOPRESSOR) 25 MG tablet TAKE ONE TABLET TWICE DAILY  . mirabegron ER (MYRBETRIQ) 25 MG TB24 tablet Take 25 mg by mouth every morning.   . nitroGLYCERIN (NITROSTAT) 0.4 MG SL tablet Place 1 tablet (0.4 mg total) under the tongue every 5 (five) minutes as needed for chest pain.  . NON FORMULARY Nystatin elixir month wash  . rosuvastatin (CRESTOR) 20 MG tablet TAKE ONE TABLET EVERY DAY  . tamsulosin (FLOMAX) 0.4 MG CAPS capsule Take 0.4 mg by mouth daily.   No facility-administered  encounter medications on file as of 11/30/2017.      Allergies:  Clonazepam; Codeine; Niacin and related; Tramadol; Hydrocodone; and Oxycodone  Review of Systems: Gen:  Denies  fever, sweats. HEENT: Denies blurred vision. Cvc:  No dizziness, chest pain or heaviness Resp:   Denies cough or sputum porduction. Gi: Denies swallowing difficulty, stomach pain. constipation, bowel incontinence Gu:  Denies bladder incontinence, burning urine Ext:   No Joint pain, stiffness. Skin: No skin rash, easy bruising. Endoc:  No polyuria, polydipsia. Psych: No depression, insomnia. Other:  All other systems were reviewed and found to be negative other than what is mentioned in the HPI.   Physical Examination:   VS: BP 106/74 (BP Location: Right Arm, Cuff Size: Normal)   Pulse 85   Ht 6' (1.829 m)   Wt 231 lb (104.8 kg)   SpO2 96%   BMI 31.33 kg/m   General Appearance: No distress  Neuro:without focal findings,  speech normal,  HEENT: PERRLA,  EOM intact. Pulmonary: normal breath sounds, No wheezing.   CardiovascularNormal S1,S2.  No m/r/g.   Abdomen: Benign, Soft, non-tender. Renal:  No costovertebral tenderness  GU:  Not performed at this time. Endoc: No evident thyromegaly, no signs of acromegaly. Skin:   warm, no rash. Extremities: normal, no cyanosis, clubbing.   LABORATORY PANEL:   CBC No results for input(s): WBC, HGB, HCT, PLT in the last 168 hours. ------------------------------------------------------------------------------------------------------------------  Chemistries  No results for input(s): NA, K, CL, CO2, GLUCOSE, BUN, CREATININE, CALCIUM, MG, AST, ALT, ALKPHOS, BILITOT in the last 168 hours.  Invalid input(s): GFRCGP ------------------------------------------------------------------------------------------------------------------  Cardiac Enzymes No results for input(s): TROPONINI in the last 168  hours. ------------------------------------------------------------  RADIOLOGY:   No results found for this or any previous visit. Results for orders placed during the hospital encounter of 06/02/16  DG Chest 2 View   Narrative CLINICAL DATA:  Fatigue and weakness.  EXAM: CHEST  2 VIEW  COMPARISON:  CT of the chest on 12/05/2015  FINDINGS: The heart size and mediastinal contours are within normal limits and stable post CABG. Stable calcified granuloma at the right lung apex. Stable bilateral parenchymal scarring. There is no evidence of pulmonary edema, consolidation, pneumothorax, nodule or pleural fluid. The visualized skeletal structures are unremarkable.  IMPRESSION: No active cardiopulmonary disease.   Electronically Signed   By: Aletta Edouard M.D.   On: 06/02/2016 16:19    ------------------------------------------------------------------------------------------------------------------  Thank  you for allowing [facility] to assist in the care of your patient. Our recommendations are noted above.  Please contact us if we can be of further service.   Marda Stalker, MD.  [facility] Office Number: [phone]  Patricia Pesa, M.D.  Merton Border, M.D  11/29/2017

## 2017-11-30 ENCOUNTER — Encounter: Payer: Self-pay | Admitting: Internal Medicine

## 2017-11-30 ENCOUNTER — Ambulatory Visit (INDEPENDENT_AMBULATORY_CARE_PROVIDER_SITE_OTHER): Payer: Medicare Other | Admitting: Internal Medicine

## 2017-11-30 VITALS — BP 106/74 | HR 85 | Ht 72.0 in | Wt 231.0 lb

## 2017-11-30 DIAGNOSIS — I779 Disorder of arteries and arterioles, unspecified: Secondary | ICD-10-CM | POA: Diagnosis not present

## 2017-11-30 DIAGNOSIS — G4733 Obstructive sleep apnea (adult) (pediatric): Secondary | ICD-10-CM | POA: Diagnosis not present

## 2017-11-30 DIAGNOSIS — G4761 Periodic limb movement disorder: Secondary | ICD-10-CM

## 2017-11-30 DIAGNOSIS — J432 Centrilobular emphysema: Secondary | ICD-10-CM

## 2017-11-30 DIAGNOSIS — Z9989 Dependence on other enabling machines and devices: Secondary | ICD-10-CM

## 2017-11-30 NOTE — Patient Instructions (Signed)
You have been doing very well with your CPAP for the past week, continue using the CPAP every night.

## 2017-12-09 DIAGNOSIS — C44311 Basal cell carcinoma of skin of nose: Secondary | ICD-10-CM | POA: Diagnosis not present

## 2017-12-09 DIAGNOSIS — D485 Neoplasm of uncertain behavior of skin: Secondary | ICD-10-CM | POA: Diagnosis not present

## 2017-12-22 DIAGNOSIS — C44311 Basal cell carcinoma of skin of nose: Secondary | ICD-10-CM | POA: Diagnosis not present

## 2017-12-22 DIAGNOSIS — L814 Other melanin hyperpigmentation: Secondary | ICD-10-CM | POA: Diagnosis not present

## 2017-12-22 DIAGNOSIS — Z85828 Personal history of other malignant neoplasm of skin: Secondary | ICD-10-CM | POA: Diagnosis not present

## 2017-12-22 DIAGNOSIS — L988 Other specified disorders of the skin and subcutaneous tissue: Secondary | ICD-10-CM | POA: Diagnosis not present

## 2017-12-22 DIAGNOSIS — L578 Other skin changes due to chronic exposure to nonionizing radiation: Secondary | ICD-10-CM | POA: Diagnosis not present

## 2017-12-27 ENCOUNTER — Other Ambulatory Visit: Payer: Self-pay

## 2017-12-27 ENCOUNTER — Emergency Department: Payer: Medicare Other

## 2017-12-27 DIAGNOSIS — I10 Essential (primary) hypertension: Secondary | ICD-10-CM | POA: Diagnosis not present

## 2017-12-27 DIAGNOSIS — Z87891 Personal history of nicotine dependence: Secondary | ICD-10-CM | POA: Diagnosis not present

## 2017-12-27 DIAGNOSIS — I2510 Atherosclerotic heart disease of native coronary artery without angina pectoris: Secondary | ICD-10-CM | POA: Diagnosis not present

## 2017-12-27 DIAGNOSIS — E039 Hypothyroidism, unspecified: Secondary | ICD-10-CM | POA: Insufficient documentation

## 2017-12-27 DIAGNOSIS — Z79899 Other long term (current) drug therapy: Secondary | ICD-10-CM | POA: Diagnosis not present

## 2017-12-27 DIAGNOSIS — J449 Chronic obstructive pulmonary disease, unspecified: Secondary | ICD-10-CM | POA: Insufficient documentation

## 2017-12-27 DIAGNOSIS — R002 Palpitations: Secondary | ICD-10-CM | POA: Diagnosis not present

## 2017-12-27 DIAGNOSIS — R079 Chest pain, unspecified: Secondary | ICD-10-CM | POA: Insufficient documentation

## 2017-12-27 DIAGNOSIS — R0602 Shortness of breath: Secondary | ICD-10-CM | POA: Diagnosis not present

## 2017-12-27 DIAGNOSIS — Z951 Presence of aortocoronary bypass graft: Secondary | ICD-10-CM | POA: Diagnosis not present

## 2017-12-27 DIAGNOSIS — R0789 Other chest pain: Secondary | ICD-10-CM | POA: Diagnosis not present

## 2017-12-27 LAB — BASIC METABOLIC PANEL
Anion gap: 8 (ref 5–15)
BUN: 21 mg/dL — ABNORMAL HIGH (ref 6–20)
CO2: 23 mmol/L (ref 22–32)
Calcium: 8.8 mg/dL — ABNORMAL LOW (ref 8.9–10.3)
Chloride: 108 mmol/L (ref 101–111)
Creatinine, Ser: 1.24 mg/dL (ref 0.61–1.24)
GFR calc Af Amer: >60 mL/min (ref >60)
GFR calc non Af Amer: 52 mL/min — ABNORMAL LOW (ref >60)
Glucose, Bld: 103 mg/dL — ABNORMAL HIGH (ref 65–99)
Potassium: 3.8 mmol/L (ref 3.5–5.1)
Sodium: 139 mmol/L (ref 135–145)

## 2017-12-27 LAB — CBC
HCT: 43.7 % (ref 40.0–52.0)
Hemoglobin: 14.8 g/dL (ref 13.0–18.0)
MCH: 31.6 pg (ref 26.0–34.0)
MCHC: 33.8 g/dL (ref 32.0–36.0)
MCV: 93.5 fL (ref 80.0–100.0)
Platelets: 155 10*3/uL (ref 150–440)
RBC: 4.67 MIL/uL (ref 4.40–5.90)
RDW: 14.8 % — ABNORMAL HIGH (ref 11.5–14.5)
WBC: 10.9 10*3/uL — ABNORMAL HIGH (ref 3.8–10.6)

## 2017-12-27 LAB — TROPONIN I: Troponin I: <0.03 ng/mL (ref <0.03)

## 2017-12-27 NOTE — ED Triage Notes (Signed)
Patient to ED with [facility] for complaints of "not feeling right." Was sitting on the couch today when he experienced some palpitations. Denied N/V or SHOB at onset but described it as "heavy congestion" feeling in his chest. Patient alert and oriented and able to answer questions appropriately.

## 2017-12-28 ENCOUNTER — Emergency Department
Admission: EM | Admit: 2017-12-28 | Discharge: 2017-12-28 | Disposition: A | Discharge status: Home / Self Care | Payer: Medicare Other | Attending: Emergency Medicine | Admitting: Emergency Medicine

## 2017-12-28 ENCOUNTER — Telehealth: Payer: Self-pay | Admitting: Cardiology

## 2017-12-28 DIAGNOSIS — R002 Palpitations: Secondary | ICD-10-CM

## 2017-12-28 DIAGNOSIS — R079 Chest pain, unspecified: Secondary | ICD-10-CM

## 2017-12-28 LAB — URINALYSIS, COMPLETE (UACMP) WITH MICROSCOPIC
Bacteria, UA: NONE SEEN
Bilirubin Urine: NEGATIVE
Glucose, UA: NEGATIVE mg/dL
Hgb urine dipstick: NEGATIVE
Ketones, ur: NEGATIVE mg/dL
Leukocytes, UA: NEGATIVE
Nitrite: NEGATIVE
Protein, ur: NEGATIVE mg/dL
Specific Gravity, Urine: 1.018 (ref 1.005–1.030)
pH: 5 (ref 5.0–8.0)

## 2017-12-28 LAB — MAGNESIUM: Magnesium: 2 mg/dL (ref 1.7–2.4)

## 2017-12-28 LAB — TROPONIN I: Troponin I: <0.03 ng/mL (ref <0.03)

## 2017-12-28 LAB — TSH: TSH: 6.189 u[IU]/mL — ABNORMAL HIGH (ref 0.350–4.500)

## 2017-12-28 MED ORDER — SODIUM CHLORIDE 0.9 % IV BOLUS
500.0000 mL | Freq: Once | INTRAVENOUS | Status: AC
  Administered 2017-12-28: 500 mL via INTRAVENOUS

## 2017-12-28 NOTE — Telephone Encounter (Signed)
New message  Pt girlfriend verbalzied that she is calling for RN  Pt went to the ER at [facility] she want provider to review his   Visit, pt also had nose surgery 12/22/2017

## 2017-12-28 NOTE — ED Notes (Signed)
Pt with noted change in rhythm to a fib.

## 2017-12-28 NOTE — Discharge Instructions (Addendum)
Please follow up with your primary care physician for further evaluation of your symptoms. Please take our night time medicines at home and inform your physician of your abnormal blood work. Please return with any other concerns

## 2017-12-28 NOTE — Telephone Encounter (Signed)
Spoke to girlfriend (ok per DPR) who states patient has had a rough couple of days.   She states he started having palpitations yesterday and was complaining of chest pressure.  She took his O2 and HR with her pulse ox and it was very erratic up to 110 so they went to there ER.   Everything was stable so they discharged him with instructions to see Dr. Ellyn Hack in 2 days.    He is feeling better today, tired from only sleeping 2 hours.   No palpitations or CP.    She also reports he had nose surgery last week to remove basal cell and is suppose to have a prostate biopsy next week as well.   Follow up made with Dr. Ellyn Hack Wednesday 5/1 at 1140.    She is aware and verbalized understanding.

## 2017-12-28 NOTE — ED Provider Notes (Signed)
[facility] Emergency Department Provider Note  ____________________________________________   First MD Initiated Contact with Patient 12/28/17 0211     (approximate)  I have reviewed the triage vital signs and the nursing notes.   HISTORY  Chief Complaint Palpitations    HPI [name] is a 84 y.o. male who comes into the hospital today with some palpitations.  The patient was sitting on the couch and told his wife he felt as though his heart was having palpitations.  He reports that it felt like pressure but denies pain or sweats.  The patient's wife checked his heart rate while using a pulse oximeter.  She states that the patient's heart rate was jumping between 97 and 108 so they decided to come in.  The patient has had no shortness of breath and as he was waiting his symptoms subsided.  The patient has a history of a CABG.  He had had a very calm day and she states that the symptoms started after he woke up from his nap.  The patient is here today for evaluation.   Past Medical History:  Diagnosis Date  . Arthritis   . Basal cell carcinoma of eyelid 01/03/2013  . Cancer [facility])    Skin cancer  . Colon polyps   . COPD (chronic obstructive pulmonary disease) ([place])   . Coronary artery disease   . Dysrhythmia    afib  . GERD (gastroesophageal reflux disease)   . Hemorrhoid 02/10/2015  . History of hiatal hernia   . HOH (hard of hearing)   . Hypertension   . Memory loss   . Osteoporosis   . Prostate disorder   . Sciatic pain    Chronic  . Sleep apnea    cpap  . Temporary cerebral vascular dysfunction 02/10/2015   Had negative work-up.  July, 2012.  No medication changes, had forgot them that day, got dehydrated.     Patient Active Problem List   Diagnosis Date Noted  . Blood in stool   . Abnormal feces   . Stricture and stenosis of esophagus   . Benign neoplasm of ascending colon   . Benign neoplasm of descending colon   . Senile  purpura ([place]) 04/29/2017  . Anemia 04/29/2017  . Frequent falls 04/29/2017  . Benign localized hyperplasia of prostate with urinary obstruction 07/15/2016  . Elevated PSA 07/15/2016  . Incomplete emptying of bladder 07/15/2016  . Urge incontinence 07/15/2016  . Hypothyroidism 04/25/2016  . Macular degeneration 04/25/2016  . Erectile dysfunction due to arterial insufficiency   . Ischemic heart disease with chronotropic incompetence   . CN (constipation) 02/10/2015  . DD (diverticular disease) 02/10/2015  . Accumulation of fluid in tissues 02/10/2015  . Elevated WBC count 02/10/2015  . Fatigue 02/10/2015  . Hypoxia 02/10/2015  . Lichen planus 09/32/3557  . Restless leg 02/10/2015  . 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 B12 deficiency 02/10/2015  . Lower extremity edema 12/14/2014  . COPD (chronic obstructive pulmonary disease) ([place]) 11/14/2014  . Post Inflammatory Lung Changes 11/14/2014  . Confusion state 11/08/2014  . Amnesia 11/08/2014  . PAF (paroxysmal atrial fibrillation) (HCC) CHA2DS2-VASc = 4. AC = Eliquis   . Insomnia 12/09/2013  . OSA on CPAP   . Erectile dysfunction   . Dyspnea on exertion - -essentially resolved with BB dose reduction & wgt loss 03/29/2013  . Overweight (BMI 25.0-29.9) -- Wgt back up 01/17/2013  . Atherosclerotic heart disease of native coronary artery without angina  pectoris - s/p CABG, occluded SVG-D1; patent LIMA-LAD, SVG-OM, SVG- RCA   . Essential hypertension   . Hyperlipidemia with target LDL less than 70   . Aorto-iliac disease ([place])   . BCC (basal cell carcinoma), eyelid 01/03/2013  . Allergic rhinitis 08/17/2009  . Arthralgia of ankle or foot 06/04/2009  . Adaptation reaction 05/28/2009  . Benign prostatic hypertrophy without urinary obstruction 05/28/2009  . Acid reflux 05/28/2009  . Arthritis, degenerative 05/28/2009  . 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 Carotid artery occlusion without infarction 06/01/2006  . PAD  (peripheral artery disease) - bilateral common iliac stents 12/15/2005  . Aortoiliac occlusive disease ([place]) 11/30/2005  . Atherosclerotic heart disease of artery bypass graft 09/01/1997    Past Surgical History:  Procedure Laterality Date  . ANKLE SURGERY    . Arch aortogram and carotid aortogram  06/02/2006   Dr Oneida Alar did surgery  . BASAL CELL CARCINOMA EXCISION  04/2016   Dermatology  . CARDIAC CATHETERIZATION  01/30/1998     total native LAD andRCA ,mod native CIRC; svg to RCA and OBTUSE MARG. PATENT the left internal mammary artery graft to LAD ; LV NORMAL  /  . CAROTID ENDARTERECTOMY Left Oct. 29, 2007   Dr. Oneida Alar:  . CATARACT EXTRACTION W/PHACO Right 03/05/2017   Procedure: CATARACT EXTRACTION PHACO AND INTRAOCULAR LENS PLACEMENT (IOC);  Surgeon: Leandrew Koyanagi, MD;  Location: [facility] ORS;  Service: Ophthalmology;  Laterality: Right;  [place] 00:58.5 AP% 10.6 CDE 6.20 Fluid Pack lot # [id number]  . CATARACT EXTRACTION W/PHACO Left 04/15/2017   Procedure: CATARACT EXTRACTION PHACO AND INTRAOCULAR LENS PLACEMENT ([place])  Left;  Surgeon: Leandrew Koyanagi, MD;  Location: [place];  Service: Ophthalmology;  Laterality: Left;  . COLONOSCOPY WITH PROPOFOL N/A 09/15/2017   Procedure: COLONOSCOPY WITH PROPOFOL;  Surgeon: Lucilla Lame, MD;  Location: [facility] ENDOSCOPY;  Service: Endoscopy;  Laterality: N/A;  . CORONARY ANGIOPLASTY     stent  . CORONARY ARTERY BYPASS GRAFT  1998   LIMA-LAD, SVG-OM, SVG-RPDA, SVG-DIAG  . CPET / MET  12/2012   Mild chronotropic incompetence - read 82% of predicted; also reduced effort; peak VO2 15.7 / 75% (did not reach Max effort) -- suggested ischemic response in last 1.5 minutes of exercise. Normal pulmonary function on PFTs but poor response to  . DOPPLER ECHOCARDIOGRAPHY  11/20/2010   EF =>55%; LV norm mild aortic scelorosis  . ESOPHAGOGASTRODUODENOSCOPY (EGD) WITH PROPOFOL N/A 09/15/2017   Procedure: ESOPHAGOGASTRODUODENOSCOPY (EGD) WITH PROPOFOL;   Surgeon: Lucilla Lame, MD;  Location[facility] ENDOSCOPY;  Service: Endoscopy;  Laterality: N/A;  . FRACTURE SURGERY Right 03/19/2015   wrist  . HIATAL HERNIA REPAIR    . ILIAC ARTERY STENT  12/15/2005   PTA and direct stenting rgt and lft common iliac arteries  . NM MYOCAR PERF WALL MOTION  11/11/2010; February 2016   a) TM STRESS----NORMAL PERFUSION ,EF 68%; b) EF 50-55%. Normal LV function. No significant ischemia or infarction.  . ORIF ELBOW FRACTURE Right 01/01/2017   Procedure: OPEN REDUCTION INTERNAL FIXATION (ORIF) ELBOW/OLECRANON FRACTURE;  Surgeon: Hessie Knows, MD;  Location: [facility] ORS;  Service: Orthopedics;  Laterality: Right;  . ORIF WRIST FRACTURE Right 03/19/2015   Procedure: OPEN REDUCTION INTERNAL FIXATION (ORIF) WRIST FRACTURE;  Surgeon: Hessie Knows, MD;  Location: [facility] ORS;  Service: Orthopedics;  Laterality: Right;  . THORACIC AORTA - CAROTID ANGIOGRAM  October 2007   Dr. 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 TRANSTHORACIC ECHOCARDIOGRAM  February 2016   [facility]: Normal LV function. Dilated left atrium.    Prior to Admission medications   Medication Sig Start Date End Date Taking? Authorizing Provider  acetaminophen (TYLENOL) 500 MG tablet Take 500-1,000 mg by mouth every 6 (six) hours as needed (for pain.).    [provider]  ADVAIR DISKUS 250-50 MCG/DOSE AEPB INHALE 1 PUFF INTO THE LUNGS TWICE DAILY 08/17/17   Laverle Hobby, MD  albuterol (PROVENTIL HFA) 108 (90 Base) MCG/ACT inhaler Inhale into the lungs. 11/14/14   [provider]  amitriptyline (ELAVIL) 25 MG tablet Take 25 mg by mouth at bedtime. 11/18/17 11/18/18  [provider]  Ascorbic Acid (VITAMIN C) 1000 MG tablet Take 1,000 mg by mouth daily.    [provider]  Cholecalciferol (VITAMIN D-3) 5000 units TABS Take 5,000 Units by mouth every evening.    [provider]    Cyanocobalamin (VITAMIN B-12) 3000 MCG SUBL Place 3,000 mcg under the tongue daily.    [provider]  dexlansoprazole (DEXILANT) 60 MG capsule Take 1 capsule (60 mg total) by mouth daily. 01/15/17   [name], MD  donepezil (ARICEPT) 10 MG tablet Take 10 mg by mouth daily. 11/18/17   [provider]  ELIQUIS 5 MG TABS tablet TAKE ONE TABLET TWICE DAILY 07/13/17   Leonie Man, MD  ezetimibe (ZETIA) 10 MG tablet TAKE ONE TABLET EVERY DAY 03/18/17   [name], MD  finasteride (PROSCAR) 5 MG tablet Take 5 mg by mouth daily. 07/15/16   [provider]  fluticasone (FLONASE) 50 MCG/ACT nasal spray Place 2 sprays into both nostrils daily. Patient taking differently: Place 2 sprays into both nostrils daily as needed.  01/12/17   [name], MD  Fluticasone-Salmeterol (ADVAIR DISKUS) 250-50 MCG/DOSE AEPB Inhale 1 puff into the lungs 2 (two) times daily. 09/12/16 09/12/17  Laverle Hobby, MD  lisinopril (PRINIVIL,ZESTRIL) 2.5 MG tablet TAKE ONE TABLET BY MOUTH TWICE DAILY 08/15/17   [name], MD  magnesium oxide (MAG-OX) 400 MG tablet Take 400 mg by mouth every evening.     [provider]  metoprolol tartrate (LOPRESSOR) 25 MG tablet TAKE ONE TABLET TWICE DAILY 05/14/17   Leonie Man, MD  mirabegron ER (MYRBETRIQ) 25 MG TB24 tablet Take 25 mg by mouth every morning.     [provider]  nitroGLYCERIN (NITROSTAT) 0.4 MG SL tablet Place 1 tablet (0.4 mg total) under the tongue every 5 (five) minutes as needed for chest pain. 01/12/17   [name], MD  NON FORMULARY Nystatin elixir month wash    [provider]  rosuvastatin (CRESTOR) 20 MG tablet TAKE ONE TABLET EVERY DAY 06/12/17   [name], MD  tamsulosin (FLOMAX) 0.4 MG CAPS capsule Take 0.4 mg by mouth daily. 07/15/16   [provider]    Allergies Clonazepam; Codeine; Niacin and related; Tramadol; Hydrocodone; and Oxycodone  Family  History  Problem Relation Age of Onset  . Ulcers Mother        Peptic  . Dementia Mother   . Alcohol abuse Father     Social History Social History   Tobacco Use  . Smoking status: Former Smoker    Types: Cigarettes    Last attempt to quit: 08/04/1997    Years since quitting: 20.4  . Smokeless tobacco: Never Used  Substance Use Topics  . Alcohol use: No  . Drug use: No  Review of Systems  Constitutional: No fever/chills Eyes: No visual changes. ENT: No sore throat. Cardiovascular: palpitations and chest pressure Respiratory: Denies shortness of breath. Gastrointestinal: No abdominal pain.  No nausea, no vomiting.  No diarrhea.  No constipation. Genitourinary: Negative for dysuria. Musculoskeletal: Negative for back pain. Skin: Negative for rash. Neurological: Negative for headaches, focal weakness or numbness.   ____________________________________________   PHYSICAL EXAM:  VITAL SIGNS: ED Triage Vitals  Enc Vitals Group     BP 12/27/17 2029 (!) 150/81     Pulse Rate 12/27/17 2029 96     Resp 12/27/17 2029 18     Temp 12/27/17 2029 98.2 F (36.8 C)     Temp Source 12/27/17 2029 Oral     SpO2 12/27/17 2029 95 %     Weight 12/27/17 2023 224 lb (101.6 kg)     Height 12/27/17 2023 6' (1.829 m)     Head Circumference --      Peak Flow --      Pain Score 12/27/17 2022 0     Pain Loc --      Pain Edu? --      Excl. in [place]? --     Constitutional: Alert and oriented. Well appearing and in no acute distress. Eyes: Conjunctivae are normal. PERRL. EOMI. Head: Atraumatic. Nose: No congestion/rhinnorhea. Mouth/Throat: Mucous membranes are moist.  Oropharynx non-erythematous. Cardiovascular: Normal rate, regular rhythm. Grossly normal heart sounds.  Good peripheral circulation. Respiratory: Normal respiratory effort.  No retractions. Lungs CTAB. Gastrointestinal: Soft and nontender. No distention. Positive bowel sounds Musculoskeletal: No lower extremity  tenderness nor edema.   Neurologic:  Normal speech and language.  Skin:  Skin is warm, dry and intact.  Psychiatric: Mood and affect are normal.   ____________________________________________   LABS (all labs ordered are listed, but only abnormal results are displayed)  Labs Reviewed  BASIC METABOLIC PANEL - Abnormal; Notable for the following components:      Result Value   Glucose, Bld 103 (*)    BUN 21 (*)    Calcium 8.8 (*)    GFR calc non Af Amer 52 (*)    All other components within normal limits  CBC - Abnormal; Notable for the following components:   WBC 10.9 (*)    RDW 14.8 (*)    All other components within normal limits  TSH - Abnormal; Notable for the following components:   TSH 6.189 (*)    All other components within normal limits  URINALYSIS, COMPLETE (UACMP) WITH MICROSCOPIC - Abnormal; Notable for the following components:   Color, Urine YELLOW (*)    APPearance CLEAR (*)    All other components within normal limits  TROPONIN I  MAGNESIUM  TROPONIN I   ____________________________________________  EKG  ED ECG REPORT I, Loney Hering, the attending physician, personally viewed and interpreted this ECG.   Date: 12/27/2017  EKG Time: 2022  Rate: 96  Rhythm: normal sinus rhythm  Axis: normal  Intervals:right bundle branch block  ST&T Change: flipped t waves in leads III, V1, V2, which is old and V3 which is new  ____________________________________________  RADIOLOGY  ED MD interpretation:  CXR: Generalized interstitial coarsening above prior baseline which could be bronchitis or congestive  Official radiology report(s): Dg Chest 2 View  Result Date: 12/27/2017 CLINICAL DATA:  Shortness of breath. Heavy congestion feeling and chest EXAM: CHEST - 2 VIEW COMPARISON:  06/02/2016 FINDINGS: Normal heart size and stable mediastinal contours. Status post CABG. There  is diffuse interstitial coarsening that is new from prior. Stable calcified nodule  at the right apex and asymmetric density at the left first costochondral junction. No acute osseous finding. IMPRESSION: Generalized interstitial coarsening above prior baseline which could be bronchitic or congestive. Electronically Signed   By: Monte Fantasia M.D.   On: 12/27/2017 21:18    ____________________________________________   PROCEDURES  Procedure(s) performed: None  Procedures  Critical Care performed: No  ____________________________________________   INITIAL IMPRESSION / ASSESSMENT AND PLAN / ED COURSE  As part of my medical decision making, I reviewed the following data within the electronic MEDICAL RECORD NUMBER Notes from prior ED visits and [place] Controlled Substance Database   This is an 84 year old male who comes into the hospital today with some palpitations.  The patient also had some chest pressure.  My differential diagnosis includes arrhythmia, atrial fibrillation, acute coronary syndrome  We did check some blood work on the patient to include a CBC, BMP, urinalysis, troponin, magnesium, TSH.  The patient's initial blood work was unremarkable.  His TSH is 6.189.  I repeated the troponin which was negative.  The patient also got a 500 mL bolus of normal saline.  His wife did state that he was seeming shaky when he was getting the fluid but I did assess him and he was not shaking.  The patient was calm.  She was also concerned as he was waiting that his blood pressure was becoming elevated but I informed her that he does need to take his nighttime medications when he got home.  The patient's work-up is unremarkable.  He did not have any findings of atrial fibrillation although he does have paroxysmal atrial fibrillation.  He will be discharged to follow-up with his cardiologist and primary care physician.      ____________________________________________   FINAL CLINICAL IMPRESSION(S) / ED DIAGNOSES  Final diagnoses:  Palpitations  Chest pain, unspecified type       ED Discharge Orders    None       Note:  This document was prepared using Dragon voice recognition software and may include unintentional dictation errors.    Loney Hering, MD 12/28/17 [phone]

## 2017-12-30 ENCOUNTER — Telehealth: Payer: Self-pay | Admitting: Internal Medicine

## 2017-12-30 ENCOUNTER — Ambulatory Visit (INDEPENDENT_AMBULATORY_CARE_PROVIDER_SITE_OTHER): Payer: Medicare Other | Admitting: Cardiology

## 2017-12-30 ENCOUNTER — Encounter: Payer: Self-pay | Admitting: Cardiology

## 2017-12-30 VITALS — BP 140/78 | HR 71 | Ht 72.0 in | Wt 229.0 lb

## 2017-12-30 DIAGNOSIS — I779 Disorder of arteries and arterioles, unspecified: Secondary | ICD-10-CM

## 2017-12-30 DIAGNOSIS — I10 Essential (primary) hypertension: Secondary | ICD-10-CM | POA: Diagnosis not present

## 2017-12-30 DIAGNOSIS — D692 Other nonthrombocytopenic purpura: Secondary | ICD-10-CM

## 2017-12-30 DIAGNOSIS — Z01818 Encounter for other preprocedural examination: Secondary | ICD-10-CM | POA: Diagnosis not present

## 2017-12-30 DIAGNOSIS — R972 Elevated prostate specific antigen [PSA]: Secondary | ICD-10-CM

## 2017-12-30 DIAGNOSIS — I2510 Atherosclerotic heart disease of native coronary artery without angina pectoris: Secondary | ICD-10-CM | POA: Diagnosis not present

## 2017-12-30 DIAGNOSIS — I480 Paroxysmal atrial fibrillation: Secondary | ICD-10-CM | POA: Diagnosis not present

## 2017-12-30 DIAGNOSIS — I7409 Other arterial embolism and thrombosis of abdominal aorta: Secondary | ICD-10-CM

## 2017-12-30 DIAGNOSIS — E785 Hyperlipidemia, unspecified: Secondary | ICD-10-CM

## 2017-12-30 DIAGNOSIS — I2589 Other forms of chronic ischemic heart disease: Secondary | ICD-10-CM | POA: Diagnosis not present

## 2017-12-30 HISTORY — PX: NM GATED MYOVIEW (ARMC HX): HXRAD1856

## 2017-12-30 NOTE — Telephone Encounter (Signed)
Do not need this encounter °

## 2017-12-30 NOTE — Progress Notes (Signed)
I do not want to be some  PCP: William Sons, MD  Clinic Note: Chief Complaint  Patient presents with  . Hospitalization Follow-up    pt was having chest pains sunday-monday, HR was all over the place, denies SOB, swelling in hands/feet  . Coronary Artery Disease  . Atrial Fibrillation  . Cardiomyopathy    HPI: [name] is a 84 y.o. male with a PMH below who presents today for hospital follow-up -- > ER visit on April 29 with concerns for rapid heartbeat/palpitations -possible A. fib-(of the time he got to the ER he was not A. fib).   PMH notable for CAD s/p CABGx4 in 1999, ischemic heart disease, PAF (new diagnosis) with chronotropic incompetence on Eliquis, carotid artery occlusion s/p L CEAHLD, HTN, OSA on CPAP, aortoiliac occlusive disease s/p common iliac artery stenting 2007.   Recent Hospitalizations: ER visit to [facility] 12/28/2017 --> he noted feeling a pressure sensation with his heart rate going anywhere from 97 to 108 bpm.  His significant other was noticing irregular fluctuating heartbeats.  Symptoms began after waking up from a nap. Ruled out with negative troponins.-500 mL bolus of saline.  [name] was last seen on in September 2018  Studies Personally Reviewed - (if available, images/films reviewed: From Epic Chart or Care Everywhere) February 2018:  Abdominal Aortic Doppler  Right common iliac stent has increased velocity ( lower end of  50 -99%) left common iliac stent  Appears patent.  LE Doppler/ABI: Bilateral ABIs at rest normal.  Carotid Dopplers: Right ICA 40-59% (no change).  Left carotid endarterectomy with no evidence of stenosis  Interval History: [name] returns today  no longer feeling the irregular heartbeat episode she was having, but he just feels tired and fatigued.  When he was feeling the heartbeats he did notice some left-sided chest pressure.  He has not really felt like doing much of any kind of activity since he went to the  emergency room. He is actually been not been doing as much of his routine exercise in general because of right hip and knee pain --now doing about a third of what he used to do exercise wise.  Recently, he also has been pushed back a little bit from exercise because of having basal cell carcinoma removed from his nose (Moh's).  Although he noted having some chest discomfort with his palpitations spells, he did not necessarily indicate any resting or exertional chest pain.  He did have some pressure in his chest that went up to his shoulder with palpitations only.  Otherwise though he just feels somewhat fatigued. He denies any significant exertional dyspnea. Minimal edema but no PND orthopnea. Very easy bruising multiple Band-Aids on his upper extremities from  bleeding site on his skin.  Cardiac review of symptoms No PND, orthopnea or edema.  No weakness or syncope/near syncope. No TIA/amaurosis fugax symptoms. No melena, hematochezia, hematuria, or epistaxis -   Just a nuisance skin bleeding. No claudication.  ROS: A comprehensive was performed.  Pertinent symptoms noted above Review of Systems  Constitutional: Positive for malaise/fatigue (Not really is not energetic and activity has been).  HENT: Negative for congestion.   Respiratory: Positive for shortness of breath (A little bit with exertion).   Cardiovascular: Negative for leg swelling.  Musculoskeletal: Positive for joint pain (Hip and knee).  Skin:       Multiple Band-Aids bleeding. Bandage on the nose from procedure  Neurological: Negative for dizziness.  Endo/Heme/Allergies: Bruises/bleeds easily.  Psychiatric/Behavioral: The patient is not nervous/anxious and does not have insomnia.   All other systems reviewed and are negative.   I have reviewed and (if needed) personally updated the patient's problem list, medications, allergies, past medical and surgical history, social and family history.   Past Medical History:    Diagnosis Date  . Arthritis   . Atherosclerotic heart disease of native coronary artery without angina pectoris 1998   - s/p CABG, occluded SVG-D1; patent LIMA-LAD, SVG-OM, SVG- RCA  . Basal cell carcinoma of eyelid 01/03/2013   2019 - R side of Nose  . Cancer [facility])    Skin cancer  . Colon polyps   . COPD (chronic obstructive pulmonary disease) ([place])   . GERD (gastroesophageal reflux disease)   . Hemorrhoid 02/10/2015  . History of hiatal hernia   . HOH (hard of hearing)   . Hypertension   . Memory loss   . Osteoporosis   . PAF (paroxysmal atrial fibrillation) ([place])   . Prostate disorder   . Sciatic pain    Chronic  . Sleep apnea    cpap  . Temporary cerebral vascular dysfunction 02/10/2015   Had negative work-up.  July, 2012.  No medication changes, had forgot them that day, got dehydrated.     Past Surgical History:  Procedure Laterality Date  . ANKLE SURGERY    . Arch aortogram and carotid aortogram  06/02/2006   Dr Oneida Alar did surgery  . BASAL CELL CARCINOMA EXCISION  04/2016   Dermatology  . CARDIAC CATHETERIZATION  01/30/1998    (Dr. Linard Millers): Native LAD & mRCA CTO. 90% D1. Mod Cx. SVG-D1 CTO. SVG-RCA & SVG-OM along with LIMA-LAD patent;  LV NORMAL Fxn  . CAROTID ENDARTERECTOMY Left Oct. 29, 2007   Dr. Oneida Alar:  . CATARACT EXTRACTION W/PHACO Right 03/05/2017   Procedure: CATARACT EXTRACTION PHACO AND INTRAOCULAR LENS PLACEMENT (IOC);  Surgeon: Leandrew Koyanagi, MD;  Location: [facility] ORS;  Service: Ophthalmology;  Laterality: Right;  [place] 00:58.5 AP% 10.6 CDE 6.20 Fluid Pack lot # [id number]  . CATARACT EXTRACTION W/PHACO Left 04/15/2017   Procedure: CATARACT EXTRACTION PHACO AND INTRAOCULAR LENS PLACEMENT ([place])  Left;  Surgeon: Leandrew Koyanagi, MD;  Location: [place];  Service: Ophthalmology;  Laterality: Left;  . COLONOSCOPY WITH PROPOFOL N/A 09/15/2017   Procedure: COLONOSCOPY WITH PROPOFOL;  Surgeon: Lucilla Lame, MD;  Location: [facility] ENDOSCOPY;   Service: Endoscopy;  Laterality: N/A;  . CORONARY ARTERY BYPASS GRAFT  1998   LIMA-LAD, SVG-OM, SVG-RPDA, SVG-DIAG  . CPET / MET  12/2012   Mild chronotropic incompetence - read 82% of predicted; also reduced effort; peak VO2 15.7 / 75% (did not reach Max effort) -- suggested ischemic response in last 1.5 minutes of exercise. Normal pulmonary function on PFTs but poor response to  . DOPPLER ECHOCARDIOGRAPHY  11/20/2010   EF =>55%; LV norm mild aortic scelorosis  . ESOPHAGOGASTRODUODENOSCOPY (EGD) WITH PROPOFOL N/A 09/15/2017   Procedure: ESOPHAGOGASTRODUODENOSCOPY (EGD) WITH PROPOFOL;  Surgeon: Lucilla Lame, MD;  Location: [facility] ENDOSCOPY;  Service: Endoscopy;  Laterality: N/A;  . FRACTURE SURGERY Right 03/19/2015   wrist  . HIATAL HERNIA REPAIR    . ILIAC ARTERY STENT  12/15/2005   PTA and direct stenting rgt and lft common iliac arteries  . NM GATED MYOVIEW ([place] HX)  10/2014   EF 50-55%. Gut attenuation. No Ischemia or infarction -- LOW RISK  . NM MYOCAR PERF WALL MOTION  11/11/2010; February 2016   a) TM STRESS----NORMAL PERFUSION ,EF 68%; b)  EF 50-55%. Normal LV function. No significant ischemia or infarction.  . ORIF ELBOW FRACTURE Right 01/01/2017   Procedure: OPEN REDUCTION INTERNAL FIXATION (ORIF) ELBOW/OLECRANON FRACTURE;  Surgeon: Hessie Knows, MD;  Location: [facility] ORS;  Service: Orthopedics;  Laterality: Right;  . ORIF WRIST FRACTURE Right 03/19/2015   Procedure: OPEN REDUCTION INTERNAL FIXATION (ORIF) WRIST FRACTURE;  Surgeon: Hessie Knows, MD;  Location: [facility] ORS;  Service: Orthopedics;  Laterality: Right;  . THORACIC AORTA - CAROTID ANGIOGRAM  October 2007   Dr. 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 TRANSTHORACIC ECHOCARDIOGRAM  February 2016   [facility]: Normal LV function. Dilated left atrium.    Current Meds  Medication Sig  . acetaminophen (TYLENOL) 500 MG tablet Take 500-1,000 mg by mouth  every 6 (six) hours as needed (for pain.).  [name] ADVAIR DISKUS 250-50 MCG/DOSE AEPB INHALE 1 PUFF INTO THE LUNGS TWICE DAILY  . albuterol (PROVENTIL HFA) 108 (90 Base) MCG/ACT inhaler Inhale into the lungs.  [name] amitriptyline (ELAVIL) 25 MG tablet Take 25 mg by mouth at bedtime.  . Ascorbic Acid (VITAMIN C) 1000 MG tablet Take 1,000 mg by mouth daily.  . Cholecalciferol (VITAMIN D-3) 5000 units TABS Take 5,000 Units by mouth every evening.  . Cyanocobalamin (VITAMIN B-12) 3000 MCG SUBL Place 3,000 mcg under the tongue daily.  [name] dexlansoprazole (DEXILANT) 60 MG capsule Take 1 capsule (60 mg total) by mouth daily.  [name] donepezil (ARICEPT) 10 MG tablet Take 10 mg by mouth daily.  [name] ELIQUIS 5 MG TABS tablet TAKE ONE TABLET TWICE DAILY  . ezetimibe (ZETIA) 10 MG tablet TAKE ONE TABLET EVERY DAY  . finasteride (PROSCAR) 5 MG tablet Take 5 mg by mouth daily.  . fluticasone (FLONASE) 50 MCG/ACT nasal spray Place 2 sprays into both nostrils daily. (Patient taking differently: Place 2 sprays into both nostrils daily as needed. )  . lisinopril (PRINIVIL,ZESTRIL) 2.5 MG tablet TAKE ONE TABLET BY MOUTH TWICE DAILY  . magnesium oxide (MAG-OX) 400 MG tablet Take 400 mg by mouth every evening.   . metoprolol tartrate (LOPRESSOR) 25 MG tablet TAKE ONE TABLET TWICE DAILY  . mirabegron ER (MYRBETRIQ) 25 MG TB24 tablet Take 25 mg by mouth every morning.   . nitroGLYCERIN (NITROSTAT) 0.4 MG SL tablet Place 1 tablet (0.4 mg total) under the tongue every 5 (five) minutes as needed for chest pain.  . NON FORMULARY Nystatin elixir month wash  . rosuvastatin (CRESTOR) 20 MG tablet TAKE ONE TABLET EVERY DAY  . tamsulosin (FLOMAX) 0.4 MG CAPS capsule Take 0.4 mg by mouth daily.    Allergies  Allergen Reactions  . Clonazepam Other (See Comments)    Altered mental status  . Codeine Other (See Comments)    Altered mental status.  . Niacin And Related Other (See Comments)    Flushing of skin  . Tramadol     Other  reaction(s): Hallucination  . Hydrocodone     confusion  . Oxycodone Other (See Comments)    confusion confusion    Social History   Tobacco Use  . Smoking status: Former Smoker    Types: Cigarettes    Last attempt to quit: 08/04/1997    Years since quitting: 20.4  . Smokeless tobacco: Never Used  Substance Use Topics  . Alcohol use: No  . Drug use: No   Social History   Social History Narrative   He is a widowed father of one, grandfather  of 8 with 3 stepchildren. He has been working out vigorously at [employer] the basis. He's up to doing 20 minutes on the stationary bike and 10-15 minutes on the treadmill. He doesn't smoke or drink.    family history includes Alcohol abuse in his father; Dementia in his mother; Ulcers in his mother.  Wt Readings from Last 3 Encounters:  12/30/17 229 lb (103.9 kg)  12/27/17 224 lb (101.6 kg)  11/30/17 231 lb (104.8 kg)    PHYSICAL EXAM BP 140/78 (BP Location: Left Arm)   Pulse 71   Ht 6' (1.829 m)   Wt 229 lb (103.9 kg)   BMI 31.06 kg/m  Physical Exam  Constitutional: He is oriented to person, place, and time. He appears well-developed and well-nourished. No distress.  Well-groomed.  HENT:  Head: Normocephalic and atraumatic.  Eyes: Pupils are equal, round, and reactive to light. EOM are normal.  Neck: Normal range of motion. No JVD present.  Cardiovascular: Normal rate, regular rhythm, normal heart sounds and intact distal pulses.  Occasional extrasystoles are present. PMI is not displaced. Exam reveals no gallop and no friction rub.  No murmur heard. Normal S1 physiologically split S2  Pulmonary/Chest: Effort normal and breath sounds normal. No respiratory distress. He has no wheezes. He has no rales.  Abdominal: Soft. Bowel sounds are normal. He exhibits no distension. There is no tenderness. There is no rebound.  Musculoskeletal: Normal range of motion. He exhibits edema (Trivial).  Neurological: He is alert and oriented to  person, place, and time.  Psychiatric: He has a normal mood and affect. His behavior is normal. Judgment and thought content normal.  Vitals reviewed.    Adult ECG Report N/a  Other studies Reviewed: Additional studies/ records that were reviewed today include:  Recent Labs:   Lab Results  Component Value Date   CHOL 118 04/30/2017   HDL 40 04/30/2017   LDLCALC 54 04/30/2017   TRIG 122 04/30/2017   CHOLHDL 3.0 04/30/2017   Lab Results  Component Value Date   CREATININE 1.24 12/27/2017   BUN 21 (H) 12/27/2017   NA 139 12/27/2017   K 3.8 12/27/2017   CL 108 12/27/2017   CO2 23 12/27/2017     ASSESSMENT / PLAN: Problem List Items Addressed This Visit    Senile purpura (HCC)   PAF (paroxysmal atrial fibrillation) (HCC) CHA2DS2-VASc = 4. AC = Eliquis - Primary (Chronic)    Not sure what it was but is going to the emergency room.  It would seem like he may very well of had a short run of A. fib based on the description of symptoms.  He did not get hurt up, it would just get a relatively short run. I recommended that he take an extra dose of metoprolol if this occurs.  --If an episode would last more than 2 to 3 hours, he should contact the office in [place] to see if he can come in to get an EKG.  He could potentially be cardioverted at the hospital.   Interestingly, because of A. fib, he is on Eliquis.  He has a prostate biopsy next week.  I recommend that he hold Eliquis for 3 days preprocedure.  Because this is the first time he has had a spell like this in a while, and he had some chest discomfort, I am going to check a stress test which we have not done quite some time. Plan: Lexiscan Myoview       Ischemic heart  disease with chronotropic incompetence (Chronic)    He is always had a relatively preserved EF on previous evaluations with no heart failure, but clearly has diastolic dysfunction and is intolerant of A. fib.  His heart rate seems to be pretty well controlled  and stable on relatively low-dose beta-blocker.  He should be well-tolerated and occasional extra dose for rate control.  He is also on low-dose lisinopril along with Crestor..  Is not on aspirin or Plavix because of Eliquis      Relevant Orders   MYOCARDIAL PERFUSION IMAGING   NM Myocar Multi W/Spect W/Wall Motion / EF   Hyperlipidemia with target LDL less than 70 (Chronic)    Recent panel as of August last year looked great.  Probably not due until August of this year. Continue statin plus Zetia. Continue to stay active and monitoring diet.      Essential hypertension (Chronic)    Fairly well controlled with current meds.  No change for now.      Elevated PSA    He has an upcoming prostate biopsy for possible prostate cancer.  He is very concerned about going forward with A biopsy, and all this entails.  I talked him for about 10 minutes about this issue and is concerned we will start Eliquis.  The part of him possibly needing surgical treatment for prostate, we will evaluate his recent palpitation with a Myoview stress test as part of preoperative evaluation.      Atherosclerotic heart disease of native coronary artery without angina pectoris - s/p CABG, occluded SVG-D1; patent LIMA-LAD, SVG-OM, SVG- RCA (Chronic)    For as long as I have known him he been doing relatively well.   Last catheterization in 1999 showed an occluded vein graft to the diagonal but otherwise patent graft to the OM, RCA and LIMA-LAD.  He has been relatively asymptomatic for quite some time now.  He had a negative Myoview in February 2016.  He would be due in about a year follow-up Myoview, however since he is now having some palpitation A. fib type symptoms, I think he should be assessed for ischemia.  He had previously indicated that he did not want to do a stress test unless he were having symptoms.  Plan: Check Myoview stress test. Continue beta-blocker, statin plus Zetia and lisinopril. Not on aspirin or  Plavix because of full anticoagulation on Eliquis.      Relevant Orders   MYOCARDIAL PERFUSION IMAGING   NM Myocar Multi W/Spect W/Wall Motion / EF   Aorto-iliac disease ([place]) (Chronic)    Follow-up with Dr. Oneida Alar -  Dopplers recently checked       Other Visit Diagnoses    Pre-op evaluation       Relevant Orders   NM Myocar Multi W/Spect W/Wall Motion / EF      I spent a total of 45-50 minutes with the patient and chart review. >  50% of the time was spent in direct patient consultation.   Current medicines are reviewed at length with the patient today.  (+/- concerns) - what to do PRN Afib The following changes have been made:  see below  Patient Instructions  MEDICATION INSTRUCTIONS NO CHANGES EXCEPT NEXT WEEK PRIOR TO BIOPSY  DO NOT TAKE ELIQUIS  FOR 3 DAYS BEFORE BIOPSY.   IF YOU HAVE AN EPISODE OF FAST OR RACING HEART RATE  ,YOU MAY TAKE AN EXTRA METOPROLOL TABLET , IF EPISODE LAST  MORE THAN 2 TO 3 HOURS ,  YOU CAN CALL OFFICE  ( CONTACT [place]  OFFICE SINCE YOU ARE CLOSER TO YOU) TO SEE WHAT NEXT STEP .    WILL SCHEDULE AT [facility] OFFICE IN 2 WEEKS Your physician has requested that you have a lexiscan myoview. For further information please visit [email or web address]. Please follow instruction sheet, as given.    Your physician recommends that you schedule a follow-up appointment in [place].     Studies Ordered:   Orders Placed This Encounter  Procedures  . NM Myocar Multi W/Spect W/Wall Motion / EF  . MYOCARDIAL PERFUSION IMAGING      [name], M.D., M.S. Interventional Cardiologist   Pager # [phone] Phone # [phone] [address] [place], [place] [address]   Thank you for choosing [facility]!!

## 2017-12-30 NOTE — Patient Instructions (Addendum)
MEDICATION INSTRUCTIONS NO CHANGES EXCEPT NEXT WEEK PRIOR TO BIOPSY  DO NOT TAKE ELIQUIS  FOR 3 DAYS BEFORE BIOPSY.   IF YOU HAVE AN EPISODE OF FAST OR RACING HEART RATE  ,YOU MAY TAKE AN EXTRA METOPROLOL TABLET , IF EPISODE LAST  MORE THAN 2 TO 3 HOURS , YOU CAN CALL OFFICE  ( CONTACT [place]  OFFICE SINCE YOU ARE CLOSER TO YOU) TO SEE WHAT NEXT STEP .    WILL SCHEDULE AT [facility] OFFICE IN 2 WEEKS Your physician has requested that you have a lexiscan myoview. For further information please visit [email or web address]. Please follow instruction sheet, as given.    Your physician recommends that you schedule a follow-up appointment in [place].

## 2018-01-01 ENCOUNTER — Encounter: Payer: Self-pay | Admitting: Cardiology

## 2018-01-01 NOTE — Assessment & Plan Note (Signed)
He has an upcoming prostate biopsy for possible prostate cancer.  He is very concerned about going forward with A biopsy, and all this entails.  I talked him for about 10 minutes about this issue and is concerned we will start Eliquis.  The part of him possibly needing surgical treatment for prostate, we will evaluate his recent palpitation with a Myoview stress test as part of preoperative evaluation.

## 2018-01-01 NOTE — Assessment & Plan Note (Signed)
Fairly well controlled with current meds.  No change for now.

## 2018-01-01 NOTE — Assessment & Plan Note (Addendum)
For as long as I have known him he been doing relatively well.   Last catheterization in 1999 showed an occluded vein graft to the diagonal but otherwise patent graft to the OM, RCA and LIMA-LAD.  He has been relatively asymptomatic for quite some time now.  He had a negative Myoview in February 2016.  He would be due in about a year follow-up Myoview, however since he is now having some palpitation A. fib type symptoms, I think he should be assessed for ischemia.  He had previously indicated that he did not want to do a stress test unless he were having symptoms.  Plan: Check Myoview stress test. Continue beta-blocker, statin plus Zetia and lisinopril. Not on aspirin or Plavix because of full anticoagulation on Eliquis.

## 2018-01-01 NOTE — Assessment & Plan Note (Signed)
He is always had a relatively preserved EF on previous evaluations with no heart failure, but clearly has diastolic dysfunction and is intolerant of A. fib.  His heart rate seems to be pretty well controlled and stable on relatively low-dose beta-blocker.  He should be well-tolerated and occasional extra dose for rate control.  He is also on low-dose lisinopril along with Crestor..  Is not on aspirin or Plavix because of Eliquis

## 2018-01-01 NOTE — Assessment & Plan Note (Addendum)
Not sure what it was but is going to the emergency room.  It would seem like he may very well of had a short run of A. fib based on the description of symptoms.  He did not get hurt up, it would just get a relatively short run. I recommended that he take an extra dose of metoprolol if this occurs.  --If an episode would last more than 2 to 3 hours, he should contact the office in [place] to see if he can come in to get an EKG.  He could potentially be cardioverted at the hospital.   Interestingly, because of A. fib, he is on Eliquis.  He has a prostate biopsy next week.  I recommend that he hold Eliquis for 3 days preprocedure.  Because this is the first time he has had a spell like this in a while, and he had some chest discomfort, I am going to check a stress test which we have not done quite some time. Plan: [employer]

## 2018-01-01 NOTE — Assessment & Plan Note (Addendum)
Follow-up with Dr. Oneida Alar -  Dopplers recently checked

## 2018-01-01 NOTE — Assessment & Plan Note (Signed)
Recent panel as of August last year looked great.  Probably not due until August of this year. Continue statin plus Zetia. Continue to stay active and monitoring diet.

## 2018-01-06 ENCOUNTER — Other Ambulatory Visit: Payer: Medicare Other

## 2018-01-06 DIAGNOSIS — R972 Elevated prostate specific antigen [PSA]: Secondary | ICD-10-CM | POA: Diagnosis not present

## 2018-01-06 DIAGNOSIS — C61 Malignant neoplasm of prostate: Secondary | ICD-10-CM | POA: Diagnosis not present

## 2018-01-11 ENCOUNTER — Other Ambulatory Visit: Payer: Self-pay | Admitting: Family Medicine

## 2018-01-11 DIAGNOSIS — K219 Gastro-esophageal reflux disease without esophagitis: Secondary | ICD-10-CM

## 2018-01-13 ENCOUNTER — Ambulatory Visit
Admission: RE | Admit: 2018-01-13 | Discharge: 2018-01-13 | Disposition: A | Discharge status: Home / Self Care | Payer: Medicare Other | Source: Ambulatory Visit | Attending: Cardiology | Admitting: Cardiology

## 2018-01-13 DIAGNOSIS — I2510 Atherosclerotic heart disease of native coronary artery without angina pectoris: Secondary | ICD-10-CM | POA: Insufficient documentation

## 2018-01-13 DIAGNOSIS — Z01818 Encounter for other preprocedural examination: Secondary | ICD-10-CM | POA: Diagnosis not present

## 2018-01-13 DIAGNOSIS — I2589 Other forms of chronic ischemic heart disease: Secondary | ICD-10-CM | POA: Diagnosis not present

## 2018-01-13 MED ORDER — TECHNETIUM TC 99M TETROFOSMIN IV KIT
12.0000 | PACK | Freq: Once | INTRAVENOUS | Status: AC | PRN
  Administered 2018-01-13: 14.05 via INTRAVENOUS

## 2018-01-13 MED ORDER — TECHNETIUM TC 99M TETROFOSMIN IV KIT
31.6100 | PACK | Freq: Once | INTRAVENOUS | Status: AC | PRN
  Administered 2018-01-13: 31.61 via INTRAVENOUS

## 2018-01-13 MED ORDER — REGADENOSON 0.4 MG/5ML IV SOLN
0.4000 mg | Freq: Once | INTRAVENOUS | Status: AC
  Administered 2018-01-13: 0.4 mg via INTRAVENOUS

## 2018-01-14 LAB — NM MYOCAR MULTI W/SPECT W/WALL MOTION / EF
Estimated workload: 1 METS
Exercise duration (min): 0 min
Exercise duration (sec): 0 s
LV dias vol: 53 mL (ref 62–150)
LV sys vol: 19 mL
MPHR: 138 {beats}/min
Peak HR: 96 {beats}/min
Percent HR: 69 %
Rest HR: 65 {beats}/min
SDS: 0
SRS: 1
SSS: 0
TID: 0.69

## 2018-01-15 DIAGNOSIS — N3941 Urge incontinence: Secondary | ICD-10-CM | POA: Diagnosis not present

## 2018-01-15 DIAGNOSIS — C61 Malignant neoplasm of prostate: Secondary | ICD-10-CM | POA: Diagnosis not present

## 2018-01-15 DIAGNOSIS — N4283 Cyst of prostate: Secondary | ICD-10-CM | POA: Diagnosis not present

## 2018-01-18 ENCOUNTER — Telehealth: Payer: Self-pay | Admitting: Cardiology

## 2018-01-18 NOTE — Telephone Encounter (Signed)
Returned call to patient's care giver [name].Patient gives permission to discuss his care with [place].Advised myoview results not available.She wanted to let Dr.Harding know patient was diagnosed with prostate ca, he has a gleason score of # 8.Advised I will make Dr.Harding aware.

## 2018-01-18 NOTE — Telephone Encounter (Signed)
New message  [name] calling for stress test results

## 2018-01-19 NOTE — Telephone Encounter (Signed)
Sorry to hear that about Prostate Ca.  Stress Test done- reviewed - Low Risk.  [name], MD

## 2018-01-19 NOTE — Telephone Encounter (Signed)
SPOKE MS HARRIS - RESULT GIVEN. VOICE UNDERSTANDING.  REQUEST TO CHANGE APPT ON 02/04/18 HAS SCHEDULING ISSUE /HAS AN APPT. IN [place]  RN  RESCHEDULE FOR 02/22/18 AT 11:40 AM .  MS HARRIS AWARE

## 2018-01-21 DIAGNOSIS — Z85828 Personal history of other malignant neoplasm of skin: Secondary | ICD-10-CM | POA: Diagnosis not present

## 2018-01-28 DIAGNOSIS — C61 Malignant neoplasm of prostate: Secondary | ICD-10-CM | POA: Diagnosis not present

## 2018-01-28 DIAGNOSIS — I714 Abdominal aortic aneurysm, without rupture: Secondary | ICD-10-CM | POA: Diagnosis not present

## 2018-01-28 DIAGNOSIS — N200 Calculus of kidney: Secondary | ICD-10-CM | POA: Diagnosis not present

## 2018-02-03 DIAGNOSIS — Z85828 Personal history of other malignant neoplasm of skin: Secondary | ICD-10-CM | POA: Diagnosis not present

## 2018-02-03 DIAGNOSIS — Q828 Other specified congenital malformations of skin: Secondary | ICD-10-CM | POA: Diagnosis not present

## 2018-02-03 DIAGNOSIS — D485 Neoplasm of uncertain behavior of skin: Secondary | ICD-10-CM | POA: Diagnosis not present

## 2018-02-03 DIAGNOSIS — X32XXXA Exposure to sunlight, initial encounter: Secondary | ICD-10-CM | POA: Diagnosis not present

## 2018-02-03 DIAGNOSIS — L570 Actinic keratosis: Secondary | ICD-10-CM | POA: Diagnosis not present

## 2018-02-03 DIAGNOSIS — Z08 Encounter for follow-up examination after completed treatment for malignant neoplasm: Secondary | ICD-10-CM | POA: Diagnosis not present

## 2018-02-04 ENCOUNTER — Ambulatory Visit: Payer: Medicare Other | Admitting: Cardiology

## 2018-02-04 DIAGNOSIS — C61 Malignant neoplasm of prostate: Secondary | ICD-10-CM | POA: Diagnosis not present

## 2018-02-11 ENCOUNTER — Other Ambulatory Visit: Payer: Self-pay

## 2018-02-11 ENCOUNTER — Encounter: Payer: Self-pay | Admitting: Radiation Oncology

## 2018-02-11 ENCOUNTER — Ambulatory Visit
Admission: RE | Admit: 2018-02-11 | Discharge: 2018-02-11 | Disposition: A | Discharge status: Home / Self Care | Payer: Medicare Other | Source: Ambulatory Visit | Attending: Radiation Oncology | Admitting: Radiation Oncology

## 2018-02-11 VITALS — BP 159/89 | HR 73 | Temp 96.0°F | Resp 20 | Wt 229.3 lb

## 2018-02-11 DIAGNOSIS — G4730 Sleep apnea, unspecified: Secondary | ICD-10-CM | POA: Insufficient documentation

## 2018-02-11 DIAGNOSIS — Z86010 Personal history of colonic polyps: Secondary | ICD-10-CM | POA: Insufficient documentation

## 2018-02-11 DIAGNOSIS — M810 Age-related osteoporosis without current pathological fracture: Secondary | ICD-10-CM | POA: Diagnosis not present

## 2018-02-11 DIAGNOSIS — K1370 Unspecified lesions of oral mucosa: Secondary | ICD-10-CM | POA: Diagnosis not present

## 2018-02-11 DIAGNOSIS — Z87891 Personal history of nicotine dependence: Secondary | ICD-10-CM | POA: Insufficient documentation

## 2018-02-11 DIAGNOSIS — I2510 Atherosclerotic heart disease of native coronary artery without angina pectoris: Secondary | ICD-10-CM | POA: Insufficient documentation

## 2018-02-11 DIAGNOSIS — K219 Gastro-esophageal reflux disease without esophagitis: Secondary | ICD-10-CM | POA: Insufficient documentation

## 2018-02-11 DIAGNOSIS — R3915 Urgency of urination: Secondary | ICD-10-CM | POA: Insufficient documentation

## 2018-02-11 DIAGNOSIS — I480 Paroxysmal atrial fibrillation: Secondary | ICD-10-CM | POA: Insufficient documentation

## 2018-02-11 DIAGNOSIS — J449 Chronic obstructive pulmonary disease, unspecified: Secondary | ICD-10-CM | POA: Insufficient documentation

## 2018-02-11 DIAGNOSIS — M129 Arthropathy, unspecified: Secondary | ICD-10-CM | POA: Diagnosis not present

## 2018-02-11 DIAGNOSIS — R351 Nocturia: Secondary | ICD-10-CM | POA: Insufficient documentation

## 2018-02-11 DIAGNOSIS — C61 Malignant neoplasm of prostate: Secondary | ICD-10-CM | POA: Insufficient documentation

## 2018-02-11 DIAGNOSIS — I10 Essential (primary) hypertension: Secondary | ICD-10-CM | POA: Diagnosis not present

## 2018-02-11 DIAGNOSIS — Z79899 Other long term (current) drug therapy: Secondary | ICD-10-CM | POA: Diagnosis not present

## 2018-02-11 DIAGNOSIS — R350 Frequency of micturition: Secondary | ICD-10-CM | POA: Diagnosis not present

## 2018-02-11 DIAGNOSIS — Z85828 Personal history of other malignant neoplasm of skin: Secondary | ICD-10-CM | POA: Insufficient documentation

## 2018-02-11 DIAGNOSIS — J300 Vasomotor rhinitis: Secondary | ICD-10-CM | POA: Diagnosis not present

## 2018-02-11 NOTE — Consult Note (Signed)
NEW PATIENT EVALUATION  Name: [name]  MRN: [id number]  Date:   02/11/2018     DOB: 01/03/1934   This 84 y.o. 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  REFERRING PHYSICIAN: [name], MD  CHIEF COMPLAINT:  Chief Complaint  Patient presents with  . Prostate Cancer    Pt is here for initial consultation of prostate cancer    DIAGNOSIS: The encounter diagnosis was Malignant neoplasm of prostate ([place]).   PREVIOUS INVESTIGATIONS:  Pathology reports reviewed CT scan and bone scan reviewed Clinical notes reviewed  HPI: patient is an 84 year old male who has presented with a slowly rising PSA since February 2018 went from 3.3-4.09 back in February 2019. This prompted transrectal ultrasound-guided biopsy which was positive in 1 out of 6 cores for a Gleason 8 (4+4)T Scan was performed showing no evidence to suggest metastatic disease there was some areas of concern although worse thought to be artifact contamination.CT scan of the abdomen pelvis was also performed showing no evidence of extracapsular extension or evidence of metastatic disease in the abdomen or pelvis he does have some significant calcifications within his prostate. Patient does have a past medical history significant for cardiac vascular heart disease status post coronary artery bypass surgery. He was evaluated at [facility] [place] and recommendation for external beam radiation therapy was made. Also based on his Gleason 8 recommendation for at least 1-1/2-2 years of Lupron therapy was also made. Patient does have significant urgency frequency and nocturia 4-5. He does take Flomax but in the morning I have suggested he switch is that to the evening hours. He is seen today for opinion regarding radiation closer to home.  PLANNED TREATMENT REGIMEN: IM RT image guided radiation therapy  PAST MEDICAL HISTORY:  has a past  medical history of Arthritis, Atherosclerotic heart disease of native coronary artery without angina pectoris (1998), Basal cell carcinoma of eyelid (01/03/2013), Cancer ([place]), Colon polyps, COPD (chronic obstructive pulmonary disease) ([place]), GERD (gastroesophageal reflux disease), Hemorrhoid (02/10/2015), History of hiatal hernia, HOH (hard of hearing), Hypertension, Memory loss, Osteoporosis, PAF (paroxysmal atrial fibrillation) ([place]), Prostate disorder, Sciatic pain, Sleep apnea, and Temporary cerebral vascular dysfunction (02/10/2015).    PAST SURGICAL HISTORY:  Past Surgical History:  Procedure Laterality Date  . ANKLE SURGERY    . Arch aortogram and carotid aortogram  06/02/2006   Dr Oneida Alar did surgery  . BASAL CELL CARCINOMA EXCISION  04/2016   Dermatology  . CARDIAC CATHETERIZATION  01/30/1998    (Dr. Linard Millers): Native LAD & mRCA CTO. 90% D1. Mod Cx. SVG-D1 CTO. SVG-RCA & SVG-OM along with LIMA-LAD patent;  LV NORMAL Fxn  . CAROTID ENDARTERECTOMY Left Oct. 29, 2007   Dr. Oneida Alar:  . CATARACT EXTRACTION W/PHACO Right 03/05/2017   Procedure: CATARACT EXTRACTION PHACO AND INTRAOCULAR LENS PLACEMENT (IOC);  Surgeon: Leandrew Koyanagi, MD;  Location: [facility] ORS;  Service: Ophthalmology;  Laterality: Right;  [place] 00:58.5 AP% 10.6 CDE 6.20 Fluid Pack lot # [id number]  . CATARACT EXTRACTION W/PHACO Left 04/15/2017   Procedure: CATARACT EXTRACTION PHACO AND INTRAOCULAR LENS PLACEMENT ([place])  Left;  Surgeon: Leandrew Koyanagi, MD;  Location: [facility];  Service: Ophthalmology;  Laterality: Left;  . COLONOSCOPY WITH PROPOFOL N/A 09/15/2017   Procedure: COLONOSCOPY WITH PROPOFOL;  Surgeon: Lucilla Lame, MD;  Location: [facility] ENDOSCOPY;  Service: Endoscopy;  Laterality: N/A;  . [place]  LIMA-LAD, SVG-OM, SVG-RPDA, SVG-DIAG  . CPET / MET  12/2012   Mild chronotropic incompetence - read 82% of predicted; also reduced effort; peak VO2 15.7 / 75% (did not reach Max effort)  -- suggested ischemic response in last 1.5 minutes of exercise. Normal pulmonary function on PFTs but poor response to  . DOPPLER ECHOCARDIOGRAPHY  11/20/2010   EF =>55%; LV norm mild aortic scelorosis  . ESOPHAGOGASTRODUODENOSCOPY (EGD) WITH PROPOFOL N/A 09/15/2017   Procedure: ESOPHAGOGASTRODUODENOSCOPY (EGD) WITH PROPOFOL;  Surgeon: Lucilla Lame, MD;  Location: [facility] ENDOSCOPY;  Service: Endoscopy;  Laterality: N/A;  . FRACTURE SURGERY Right 03/19/2015   wrist  . HIATAL HERNIA REPAIR    . ILIAC ARTERY STENT  12/15/2005   PTA and direct stenting rgt and lft common iliac arteries  . NM GATED MYOVIEW ([place] HX)  10/2014   EF 50-55%. Gut attenuation. No Ischemia or infarction -- LOW RISK  . NM MYOCAR PERF WALL MOTION  11/11/2010; February 2016   a) TM STRESS----NORMAL PERFUSION ,EF 68%; b) EF 50-55%. Normal LV function. No significant ischemia or infarction.  . ORIF ELBOW FRACTURE Right 01/01/2017   Procedure: OPEN REDUCTION INTERNAL FIXATION (ORIF) ELBOW/OLECRANON FRACTURE;  Surgeon: Hessie Knows, MD;  Location: [facility] ORS;  Service: Orthopedics;  Laterality: Right;  . ORIF WRIST FRACTURE Right 03/19/2015   Procedure: OPEN REDUCTION INTERNAL FIXATION (ORIF) WRIST FRACTURE;  Surgeon: Hessie Knows, MD;  Location: [facility] ORS;  Service: Orthopedics;  Laterality: Right;  . THORACIC AORTA - CAROTID ANGIOGRAM  October 2007   Dr. 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 TRANSTHORACIC ECHOCARDIOGRAM  February 2016   [facility]: Normal LV function. Dilated left atrium.    FAMILY HISTORY: family history includes Alcohol abuse in his father; Dementia in his mother; Ulcers in his mother.  SOCIAL HISTORY:  reports that he quit smoking about 20 years ago. His smoking use included cigarettes. He has never used smokeless tobacco. He reports that he does not drink alcohol or use drugs.  ALLERGIES: Clonazepam; Codeine; Niacin and  related; Tramadol; Hydrocodone; and Oxycodone  MEDICATIONS:  Current Outpatient Medications  Medication Sig Dispense Refill  . acetaminophen (TYLENOL) 500 MG tablet Take 500-1,000 mg by mouth every 6 (six) hours as needed (for pain.).    [name] ADVAIR DISKUS 250-50 MCG/DOSE AEPB INHALE 1 PUFF INTO THE LUNGS TWICE DAILY 60 each 5  . amitriptyline (ELAVIL) 25 MG tablet Take 25 mg by mouth at bedtime as needed.     . Ascorbic Acid (VITAMIN C) 1000 MG tablet Take 1,000 mg by mouth daily.    . Cholecalciferol (VITAMIN D-3) 5000 units TABS Take 5,000 Units by mouth every evening.    . Cyanocobalamin (VITAMIN B-12) 3000 MCG SUBL Place 3,000 mcg under the tongue daily.    [name] DEXILANT 60 MG capsule TAKE 1 CAPSULE EVERY DAY 30 capsule 11  . donepezil (ARICEPT) 10 MG tablet Take 10 mg by mouth daily.    [name] ELIQUIS 5 MG TABS tablet TAKE ONE TABLET TWICE DAILY 180 tablet 1  . ezetimibe (ZETIA) 10 MG tablet TAKE ONE TABLET EVERY DAY 90 tablet 4  . finasteride (PROSCAR) 5 MG tablet Take 5 mg by mouth daily.    . Fluticasone-Salmeterol (ADVAIR DISKUS) 250-50 MCG/DOSE AEPB Inhale 1 puff into the lungs 2 (two) times daily. 3 each 3  . lisinopril (PRINIVIL,ZESTRIL) 2.5 MG tablet TAKE ONE TABLET BY MOUTH TWICE DAILY 180 tablet 4  . magnesium  oxide (MAG-OX) 400 MG tablet Take 400 mg by mouth every evening.     . metoprolol tartrate (LOPRESSOR) 25 MG tablet TAKE ONE TABLET TWICE DAILY 180 tablet 2  . mirabegron ER (MYRBETRIQ) 25 MG TB24 tablet Take 25 mg by mouth every morning.     . Multiple Vitamins-Minerals (PRESERVISION AREDS 2 PO) Take by mouth 2 (two) times daily.    . nitroGLYCERIN (NITROSTAT) 0.4 MG SL tablet Place 1 tablet (0.4 mg total) under the tongue every 5 (five) minutes as needed for chest pain. 20 tablet 1  . NON FORMULARY Nystatin elixir month wash    . rosuvastatin (CRESTOR) 20 MG tablet TAKE ONE TABLET EVERY DAY 90 tablet 4  . tamsulosin (FLOMAX) 0.4 MG CAPS capsule Take 0.4 mg by mouth daily.    .  fluticasone (FLONASE) 50 MCG/ACT nasal spray Place 2 sprays into both nostrils daily. (Patient not taking: Reported on 02/11/2018) 16 g 6   No current facility-administered medications for this encounter.     ECOG PERFORMANCE STATUS:  1 - Symptomatic but completely ambulatory  REVIEW OF SYSTEMS:  Patient denies any weight loss, fatigue, weakness, fever, chills or night sweats. Patient denies any loss of vision, blurred vision. Patient denies any ringing  of the ears or hearing loss. No irregular heartbeat. Patient denies heart murmur or history of fainting. Patient denies any chest pain or pain radiating to her upper extremities. Patient denies any shortness of breath, difficulty breathing at night, cough or hemoptysis. Patient denies any swelling in the lower legs. Patient denies any nausea vomiting, vomiting of blood, or coffee ground material in the vomitus. Patient denies any stomach pain. Patient states has had normal bowel movements no significant constipation or diarrhea. Patient denies any dysuria, hematuria or significant nocturia. Patient denies any problems walking, swelling in the joints or loss of balance. Patient denies any skin changes, loss of hair or loss of weight. Patient denies any excessive worrying or anxiety or significant depression. Patient denies any problems with insomnia. Patient denies excessive thirst, polyuria, polydipsia. Patient denies any swollen glands, patient denies easy bruising or easy bleeding. Patient denies any recent infections, allergies or URI. Patient "s visual fields have not changed significantly in recent time.    PHYSICAL EXAM: BP (!) 159/89   Pulse 73   Temp (!) 96 F (35.6 C)   Resp 20   Wt 229 lb 4.5 oz (104 kg)   BMI 31.10 kg/m  On rectal exam rectal sphincter tone is good prostate is firm somewhat nodular although this may be secondary to prostatic calcifications. Sulcus is preserved bilaterally no other rectal abnormality is  noted. Well-developed well-nourished patient in NAD. HEENT reveals PERLA, EOMI, discs not visualized.  Oral cavity is clear. No oral mucosal lesions are identified. Neck is clear without evidence of cervical or supraclavicular adenopathy. Lungs are clear to A&P. Cardiac examination is essentially unremarkable with regular rate and rhythm without murmur rub or thrill. Abdomen is benign with no organomegaly or masses noted. Motor sensory and DTR levels are equal and symmetric in the upper and lower extremities. Cranial nerves II through XII are grossly intact. Proprioception is intact. No peripheral adenopathy or edema is identified. No motor or sensory levels are noted. Crude visual fields are within normal range.  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 This shows only a 33% chance of organ confined disease as well as a 14% chance of pelvic lymph node involvement.I have recommended external beamimage guided I MRT radiation therapy. Would plan on delivering 8000 cGy to his prostate. I've asked urology to place fiduciary markers in his prostate for daily image guided treatment. I've also requested a four-month Lupron Depot injection. Risks and benefits of radiation therapy including increased lower urinary tract symptoms diarrhea fatigue alteration of blood counts skin reaction all were described in detail. I also described the risks and benefits of Lupron therapyincluding hot flashes and painful injection site. Patient and his significant other both seem to comprehend my treatment plan well. Appointments were being made for marker placement as well as CT scan simulation. Patient is to call with any  concerns.  I would like to take this opportunity to thank you for allowing me to participate in the care of your patient.Noreene Filbert, MD

## 2018-02-15 ENCOUNTER — Other Ambulatory Visit: Payer: Self-pay | Admitting: *Deleted

## 2018-02-15 ENCOUNTER — Telehealth: Payer: Self-pay | Admitting: *Deleted

## 2018-02-15 DIAGNOSIS — C61 Malignant neoplasm of prostate: Secondary | ICD-10-CM

## 2018-02-15 NOTE — Telephone Encounter (Signed)
All appointments given to [name], friend of Colon Rueth.  These appointments included Lupron injection, Dr. Bernardo Heater new patient appt., gold seed marker placement and CT simulation.   Ms. Kenton Kingfisher read back all appt. Dates and times.

## 2018-02-16 ENCOUNTER — Other Ambulatory Visit: Payer: Self-pay | Admitting: *Deleted

## 2018-02-22 ENCOUNTER — Ambulatory Visit (INDEPENDENT_AMBULATORY_CARE_PROVIDER_SITE_OTHER): Payer: Medicare Other | Admitting: Cardiology

## 2018-02-22 VITALS — BP 158/86 | HR 62 | Ht 72.0 in | Wt 229.4 lb

## 2018-02-22 DIAGNOSIS — E785 Hyperlipidemia, unspecified: Secondary | ICD-10-CM | POA: Diagnosis not present

## 2018-02-22 DIAGNOSIS — I2510 Atherosclerotic heart disease of native coronary artery without angina pectoris: Secondary | ICD-10-CM | POA: Diagnosis not present

## 2018-02-22 DIAGNOSIS — I779 Disorder of arteries and arterioles, unspecified: Secondary | ICD-10-CM | POA: Diagnosis not present

## 2018-02-22 DIAGNOSIS — C61 Malignant neoplasm of prostate: Secondary | ICD-10-CM | POA: Diagnosis not present

## 2018-02-22 DIAGNOSIS — I10 Essential (primary) hypertension: Secondary | ICD-10-CM | POA: Diagnosis not present

## 2018-02-22 DIAGNOSIS — I480 Paroxysmal atrial fibrillation: Secondary | ICD-10-CM | POA: Diagnosis not present

## 2018-02-22 DIAGNOSIS — I2589 Other forms of chronic ischemic heart disease: Secondary | ICD-10-CM | POA: Diagnosis not present

## 2018-02-22 DIAGNOSIS — I739 Peripheral vascular disease, unspecified: Secondary | ICD-10-CM

## 2018-02-22 MED ORDER — APIXABAN 5 MG PO TABS: 5.0000 mg | ORAL_TABLET | Freq: Two times a day (BID) | ORAL | 2 refills | Status: DC

## 2018-02-22 NOTE — Patient Instructions (Signed)
NO MEDICATION CHANGES   Your physician wants you to follow-up in [place]. You will receive a reminder letter in the mail two months in advance. If you don't receive a letter, please call our office to schedule the follow-up appointment.    If you need a refill on your cardiac medications before your next appointment, please call your pharmacy.

## 2018-02-22 NOTE — Progress Notes (Signed)
PCP: [name], MD  Clinic Note: Chief Complaint  Patient presents with  . Follow-up    pt wife states he has just been diagnosed with prostate cancer   . Coronary Artery Disease  . Atrial Fibrillation    HPI: [name] is a 84 y.o. male with a PMH below who presents today for hospital follow-up -- > ER visit on April 29 with concerns for rapid heartbeat/palpitations -possible A. fib-(of the time he got to the ER he was not A. fib).   PMH notable for CAD s/p CABGx4 in 1999, ischemic heart disease, PAF (new diagnosis) with chronotropic incompetence on Eliquis, carotid artery occlusion s/p L CEAHLD, HTN, OSA on CPAP, aortoiliac occlusive disease s/p common iliac artery stenting 2007.   ER visit to [facility] 12/28/2017 --> he noted feeling a pressure sensation with his heart rate going anywhere from 97 to 108 bpm.  His significant other was noticing irregular fluctuating heartbeats.  Symptoms began after waking up from a nap. Ruled out with negative troponins.-500 mL bolus of saline.  Recent Hospitalizations: [name] was last seen on Dec 30, 2017  Studies Personally Reviewed - (if available, images/films reviewed: From Epic Chart or Care Everywhere)  Myoview Jan 13, 2018: Low risk study.  EF greater than 65%.  Interval History: [name] returns today overall feeling relatively well from a cardiac standpoint.  Is been having a lot of issues with ankle and foot pain as well as back pain that keeps him from walking.  He is not able to do anywhere near the exercise that he used to do, and is doing less "cardio exercise" that he had been doing.     He has been trying to stay active, and with what he does do, he really does not have any chest tightness or pressure.  No real dyspnea with exertion.  His fatigue has improved, and the palpitations are less prominent.  Is also not really noticing any significant chest pressure tightness. He told me during this visit about recent  diagnosis of prostate cancer, but we did not discuss the potential need for him to have radioactive seeds implanted.  He seems to really be quite depressed and down by this diagnosis and upcoming treatments of XRT.  Not only is this diagnosis making it feel down, I think his arthritis pains are really bothering him as well.  He is just worried about his overall recovery from all this and is upset about not being to do what he used to be able to. He does not have any heart failure symptoms of PND, orthopnea or edema really just minimal edema.  No syncope/near syncope or TIA/amaurosis fugax.  He does occasionally get pretty significantly fatigue if not happens he may feel a few skipped beats and little uneasy sensation in his chest associated with palpitations, but that is exceedingly rare.  No real anginal symptoms. He still notes easy bruising, no significant bleeding -just a nuisance bleeding.  No melena, hematochezia, hematuria or epistaxis. No claudication.  ROS: A comprehensive was performed.  Pertinent symptoms noted above Review of Systems  Constitutional: Positive for malaise/fatigue (Seems to be partially lacking energy and partially now almost depression from all of the muscular skeletal issues and the new diagnosis of prostate cancer.).  HENT: Negative for congestion.   Respiratory: Positive for shortness of breath (Probably more because of discomfort from osteoarthritis ).   Cardiovascular: Negative for leg swelling.  Musculoskeletal: Positive for back pain and joint  pain (Hip and knee, and feet minutes --limits his walking more than dyspnea.).  Skin:       Multiple Band-Aids bleeding. Bandage on the nose from procedure  Neurological: Negative for dizziness, focal weakness and weakness.  Endo/Heme/Allergies: Bruises/bleeds easily.  Psychiatric/Behavioral: Negative for memory loss. The patient is not nervous/anxious and does not have insomnia.   All other systems reviewed and are  negative.   I have reviewed and (if needed) personally updated the patient's problem list, medications, allergies, past medical and surgical history, social and family history.   Past Medical History:  Diagnosis Date  . Arthritis   . Atherosclerotic heart disease of native coronary artery without angina pectoris 1998   - s/p CABG, occluded SVG-D1; patent LIMA-LAD, SVG-OM, SVG- RCA  . Basal cell carcinoma of eyelid 01/03/2013   2019 - R side of Nose  . Cancer [facility])    Skin cancer  . Colon polyps   . COPD (chronic obstructive pulmonary disease) ([place])   . GERD (gastroesophageal reflux disease)   . Hemorrhoid 02/10/2015  . History of hiatal hernia   . HOH (hard of hearing)   . Hypertension   . Memory loss   . Osteoporosis   . PAF (paroxysmal atrial fibrillation) ([place])   . Prostate disorder   . Sciatic pain    Chronic  . Sleep apnea    cpap  . Temporary cerebral vascular dysfunction 02/10/2015   Had negative work-up.  July, 2012.  No medication changes, had forgot them that day, got dehydrated.     Past Surgical History:  Procedure Laterality Date  . ANKLE SURGERY    . Arch aortogram and carotid aortogram  06/02/2006   Dr Oneida Alar did surgery  . BASAL CELL CARCINOMA EXCISION  04/2016   Dermatology  . CARDIAC CATHETERIZATION  01/30/1998    (Dr. Linard Millers): Native LAD & mRCA CTO. 90% D1. Mod Cx. SVG-D1 CTO. SVG-RCA & SVG-OM along with LIMA-LAD patent;  LV NORMAL Fxn  . CAROTID ENDARTERECTOMY Left Oct. 29, 2007   Dr. Oneida Alar:  . CATARACT EXTRACTION W/PHACO Right 03/05/2017   Procedure: CATARACT EXTRACTION PHACO AND INTRAOCULAR LENS PLACEMENT (IOC);  Surgeon: Leandrew Koyanagi, MD;  Location: [facility] ORS;  Service: Ophthalmology;  Laterality: Right;  [place] 00:58.5 AP% 10.6 CDE 6.20 Fluid Pack lot # [id number]  . CATARACT EXTRACTION W/PHACO Left 04/15/2017   Procedure: CATARACT EXTRACTION PHACO AND INTRAOCULAR LENS PLACEMENT ([place])  Left;  Surgeon: Leandrew Koyanagi, MD;  Location: [place];  Service: Ophthalmology;  Laterality: Left;  . COLONOSCOPY WITH PROPOFOL N/A 09/15/2017   Procedure: COLONOSCOPY WITH PROPOFOL;  Surgeon: Lucilla Lame, MD;  Location: [facility] ENDOSCOPY;  Service: Endoscopy;  Laterality: N/A;  . CORONARY ARTERY BYPASS GRAFT  1998   LIMA-LAD, SVG-OM, SVG-RPDA, SVG-DIAG  . CPET / MET  12/2012   Mild chronotropic incompetence - read 82% of predicted; also reduced effort; peak VO2 15.7 / 75% (did not reach Max effort) -- suggested ischemic response in last 1.5 minutes of exercise. Normal pulmonary function on PFTs but poor response to  . DOPPLER ECHOCARDIOGRAPHY  11/20/2010   EF =>55%; LV norm mild aortic scelorosis  . ESOPHAGOGASTRODUODENOSCOPY (EGD) WITH PROPOFOL N/A 09/15/2017   Procedure: ESOPHAGOGASTRODUODENOSCOPY (EGD) WITH PROPOFOL;  Surgeon: Lucilla Lame, MD;  Location: [facility] ENDOSCOPY;  Service: Endoscopy;  Laterality: N/A;  . FRACTURE SURGERY Right 03/19/2015   wrist  . HIATAL HERNIA REPAIR    . ILIAC ARTERY STENT  12/15/2005   PTA and direct stenting rgt  and lft common iliac arteries  . NM GATED MYOVIEW ([place] HX)  12/2017   Low Risk - no ischemia or infarct.  EF > 65%. no RWMA  . NM MYOCAR PERF WALL MOTION  11/11/2010; February 2016   a) TM STRESS----NORMAL PERFUSION ,EF 68%; b) EF 50-55%. Normal LV function. No significant ischemia or infarction.  . ORIF ELBOW FRACTURE Right 01/01/2017   Procedure: OPEN REDUCTION INTERNAL FIXATION (ORIF) ELBOW/OLECRANON FRACTURE;  Surgeon: Hessie Knows, MD;  Location: [facility] ORS;  Service: Orthopedics;  Laterality: Right;  . ORIF WRIST FRACTURE Right 03/19/2015   Procedure: OPEN REDUCTION INTERNAL FIXATION (ORIF) WRIST FRACTURE;  Surgeon: Hessie Knows, MD;  Location: [facility] ORS;  Service: Orthopedics;  Laterality: Right;  . THORACIC AORTA - CAROTID ANGIOGRAM  October 2007   Dr. 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  TRANSTHORACIC ECHOCARDIOGRAM  February 2016   [facility]: Normal LV function. Dilated left atrium.    Current Meds  Medication Sig  . acetaminophen (TYLENOL) 500 MG tablet Take 500-1,000 mg by mouth every 6 (six) hours as needed (for pain.).  [name] ADVAIR DISKUS 250-50 MCG/DOSE AEPB INHALE 1 PUFF INTO THE LUNGS TWICE DAILY  . amitriptyline (ELAVIL) 25 MG tablet Take 25 mg by mouth at bedtime as needed.   [name] apixaban (ELIQUIS) 5 MG TABS tablet Take 1 tablet (5 mg total) by mouth 2 (two) times daily.  . Ascorbic Acid (VITAMIN C) 1000 MG tablet Take 1,000 mg by mouth daily.  . Cholecalciferol (VITAMIN D-3 PO) Take 2,000 Units by mouth every evening.   . Cyanocobalamin (VITAMIN B-12) 500 MCG SUBL Place 500 mcg under the tongue daily.   [name] DEXILANT 60 MG capsule TAKE 1 CAPSULE EVERY DAY  . donepezil (ARICEPT) 10 MG tablet Take 10 mg by mouth daily.  [name] ezetimibe (ZETIA) 10 MG tablet TAKE ONE TABLET EVERY DAY  . finasteride (PROSCAR) 5 MG tablet Take 5 mg by mouth daily.  . fluticasone (FLONASE) 50 MCG/ACT nasal spray Place 2 sprays into both nostrils daily.  [name] lisinopril (PRINIVIL,ZESTRIL) 2.5 MG tablet TAKE ONE TABLET BY MOUTH TWICE DAILY  . MAGNESIUM OXIDE PO Take 500 mg by mouth every evening.   . metoprolol tartrate (LOPRESSOR) 25 MG tablet TAKE ONE TABLET TWICE DAILY  . mirabegron ER (MYRBETRIQ) 50 MG TB24 tablet Take 50 mg by mouth every morning.   . Multiple Vitamins-Minerals (PRESERVISION AREDS 2 PO) Take by mouth 2 (two) times daily.  . nitroGLYCERIN (NITROSTAT) 0.4 MG SL tablet Place 1 tablet (0.4 mg total) under the tongue every 5 (five) minutes as needed for chest pain.  . NON FORMULARY Nystatin elixir month wash  . rosuvastatin (CRESTOR) 20 MG tablet TAKE ONE TABLET EVERY DAY  . tamsulosin (FLOMAX) 0.4 MG CAPS capsule Take 0.4 mg by mouth daily.  . [DISCONTINUED] ELIQUIS 5 MG TABS tablet TAKE ONE TABLET TWICE DAILY    Allergies  Allergen Reactions  . Clonazepam Other (See Comments)     Altered mental status  . Codeine Other (See Comments)    Altered mental status.  . Niacin And Related Other (See Comments)    Flushing of skin  . Tramadol     Other reaction(s): Hallucination  . Hydrocodone     confusion  . Oxycodone Other (See Comments)    confusion confusion    Social History   Tobacco Use  . Smoking status: Former Smoker    Types: Cigarettes  Last attempt to quit: 08/04/1997    Years since quitting: 20.5  . Smokeless tobacco: Never Used  Substance Use Topics  . Alcohol use: No  . Drug use: No   Social History   Social History Narrative   He is a widowed father of one, grandfather of 44 with 3 stepchildren. He has been working out vigorously at [employer] the basis. He's up to doing 20 minutes on the stationary bike and 10-15 minutes on the treadmill. He doesn't smoke or drink.    family history includes Alcohol abuse in his father; Dementia in his mother; Ulcers in his mother.  Wt Readings from Last 3 Encounters:  02/25/18 228 lb 14.4 oz (103.8 kg)  02/22/18 229 lb 6.4 oz (104.1 kg)  02/11/18 229 lb 4.5 oz (104 kg)    PHYSICAL EXAM BP (!) 158/86   Pulse 62   Ht 6' (1.829 m)   Wt 229 lb 6.4 oz (104.1 kg)   SpO2 92%   BMI 31.11 kg/m  Physical Exam  Constitutional: He is oriented to person, place, and time. He appears well-developed and well-nourished. No distress.  Well-groomed.  HENT:  Head: Normocephalic and atraumatic.  Eyes: Pupils are equal, round, and reactive to light. EOM are normal.  Neck: Normal range of motion. No JVD present.  Cardiovascular: Normal rate, regular rhythm, normal heart sounds and intact distal pulses.  Occasional extrasystoles are present. PMI is not displaced. Exam reveals no gallop and no friction rub.  No murmur heard. Normal S1 physiologically split S2  Pulmonary/Chest: Effort normal and breath sounds normal. No respiratory distress. He has no wheezes. He has no rales.  Abdominal: Soft. Bowel sounds are normal.  He exhibits no distension. There is no tenderness. There is no rebound.  Musculoskeletal: Normal range of motion. He exhibits edema (Trivial).  Neurological: He is alert and oriented to person, place, and time.  Psychiatric: His behavior is normal. Judgment and thought content normal.  Somewhat more somber and depressed mood and affect today.  Vitals reviewed.    Adult ECG Report N/a  Other studies Reviewed: Additional studies/ records that were reviewed today include:  Recent Labs:   Lab Results  Component Value Date   CHOL 118 04/30/2017   HDL 40 04/30/2017   LDLCALC 54 04/30/2017   TRIG 122 04/30/2017   CHOLHDL 3.0 04/30/2017   Lab Results  Component Value Date   CREATININE 1.24 12/27/2017   BUN 21 (H) 12/27/2017   NA 139 12/27/2017   K 3.8 12/27/2017   CL 108 12/27/2017   CO2 23 12/27/2017     ASSESSMENT / PLAN: Problem List Items Addressed This Visit    Prostate cancer ([place])    OK to hold anticoagulation 48 hr pre-procedure for Radioactive Seed Implant/.  See recommendation by our Pharmacist re: holding & restarting.      PAF (paroxysmal atrial fibrillation) (HCC) CHA2DS2-VASc = 4. AC = Eliquis - Primary (Chronic)    He probably had a few episodes of A. fib that was somewhat symptomatic leading to the symptoms that took him to the hospital.  He remains on metoprolol and Eliquis.  At this point with episodes not being all that frequent, I think were fine with just to make control; if these episodes become more frequent, may need to consider however,.  Would strongly consider if the clinic consultation to help choose antiarrhythmic beyond potentially Multaq.      Relevant Medications   apixaban (ELIQUIS) 5 MG TABS tablet  PAD (peripheral artery disease) - bilateral common iliac stents (Chronic)    Still supposedly being followed by vascular surgery.  I do not think the symptoms he is describing related to claudication.      Relevant Medications   apixaban  (ELIQUIS) 5 MG TABS tablet   Ischemic heart disease with chronotropic incompetence (Chronic)    Noted on CPX in the past.  Relatively preserved EF with no active heart failure.  As result, I am leery of pushing beta-blocker more if additional rate control as needed for A. fib.  Would probably in normal sinus rhythm control.      Relevant Medications   apixaban (ELIQUIS) 5 MG TABS tablet   Hyperlipidemia with target LDL less than 70 (Chronic)    Most recent lipid panel from August last year looked great.  He should be due for close follow-up in August. For now continue current doses of Zetia plus statin and staying as active as possible.      Relevant Medications   apixaban (ELIQUIS) 5 MG TABS tablet   Essential hypertension (Chronic)    His blood pressure is little high today, but he seems a little bit stressed, and his pressures at home are usually better than this.  For now, to avoid any orthostatic hypotension or dehydration related hypotension during his radiation therapy I believe his current medications as is.  Would prefer to allow for mild permissive hypertension during his cancer treatment episodes.        Relevant Medications   apixaban (ELIQUIS) 5 MG TABS tablet   Atherosclerotic heart disease of native coronary artery without angina pectoris - s/p CABG, occluded SVG-D1; patent LIMA-LAD, SVG-OM, SVG- RCA (Chronic)    History of the chest discomfort he was feeling was really anginal or if it is simply noticing palpitations. Recent Myoview was negative for ischemia which is reassuring.  He may very well been feeling A. fib which would be a reasonable estimation for his uneasy sensation.  Symptoms seem to have resolved-->   He remains on beta-blocker and ACE inhibitor.  He is not on aspirin or Plavix because of Eliquis.  He is on stable dose of Crestor plus Zetia.       Relevant Medications   apixaban (ELIQUIS) 5 MG TABS tablet      I spent a total of 35  minutes with the  patient and chart review. >  50% of the time was spent in direct patient consultation.   Current medicines are reviewed at length with the patient today.  (+/- concerns) -  The following changes have been made:  none  Patient Instructions  NO MEDICATION CHANGES   Your physician wants you to follow-up in [place]. You will receive a reminder letter in the mail two months in advance. If you don't receive a letter, please call our office to schedule the follow-up appointment.    If you need a refill on your cardiac medications before your next appointment, please call your pharmacy.      Studies Ordered:   No orders of the defined types were placed in this encounter.     [name], M.D., M.S. Interventional Cardiologist   Pager # [phone] Phone # [phone] [address] [place], [place] [address]   Thank you for choosing [facility] at [facility]!!

## 2018-02-24 ENCOUNTER — Inpatient Hospital Stay: Payer: Medicare Other | Attending: Radiation Oncology

## 2018-02-24 DIAGNOSIS — C61 Malignant neoplasm of prostate: Secondary | ICD-10-CM

## 2018-02-24 DIAGNOSIS — Z5111 Encounter for antineoplastic chemotherapy: Secondary | ICD-10-CM | POA: Insufficient documentation

## 2018-02-24 MED ORDER — LEUPROLIDE ACETATE (4 MONTH) 30 MG IM KIT
30.0000 mg | PACK | Freq: Once | INTRAMUSCULAR | Status: AC
  Administered 2018-02-24: 30 mg via INTRAMUSCULAR

## 2018-02-25 ENCOUNTER — Encounter: Payer: Self-pay | Admitting: Urology

## 2018-02-25 ENCOUNTER — Ambulatory Visit (INDEPENDENT_AMBULATORY_CARE_PROVIDER_SITE_OTHER): Payer: Medicare Other | Admitting: Urology

## 2018-02-25 VITALS — BP 132/74 | HR 78 | Ht 72.0 in | Wt 228.9 lb

## 2018-02-25 DIAGNOSIS — C61 Malignant neoplasm of prostate: Secondary | ICD-10-CM

## 2018-02-25 NOTE — Progress Notes (Signed)
02/25/2018 12:37 PM   [name] 03/27/1934 [id number]  Referring provider: [name], [address] [place], [place] [address]  Chief Complaint  Patient presents with  . Prostate Cancer    New Patient    HPI: [name] is an 84 year old male who presents for transfer of urologic care.  He has previously seen Dr. Jacqlyn Larsen at [facility] and underwent prostate biopsy on 01/06/2018 for an uncorrected PSA of 4.09 on finasteride.  The prostate volume was calculated at 22 cc.  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  He was seen in the [facility] multidisciplinary oncology clinic by urology and radiation oncology and subsequent elected radiation therapy.  He wanted his radiation locally and has seen Dr. Baruch Gouty.  He is scheduled to see me next month for transrectal ultrasound and placement of gold fiducial markers.   PMH: Past Medical History:  Diagnosis Date  . Arthritis   . Atherosclerotic heart disease of native coronary artery without angina pectoris 1998   - s/p CABG, occluded SVG-D1; patent LIMA-LAD, SVG-OM, SVG- RCA  . Basal cell carcinoma of eyelid 01/03/2013   2019 - R side of Nose  . Cancer [facility])    Skin cancer  . Colon polyps   . COPD (chronic obstructive pulmonary disease) ([place])   . GERD (gastroesophageal reflux disease)   . Hemorrhoid 02/10/2015  . History of hiatal hernia   . HOH (hard of hearing)   . Hypertension   . Memory loss   . Osteoporosis   . PAF (paroxysmal atrial fibrillation) ([place])   . Prostate disorder   . Sciatic pain    Chronic  . Sleep apnea    cpap  . Temporary cerebral vascular dysfunction 02/10/2015   Had negative work-up.  July, 2012.  No medication changes, had forgot them that day, got dehydrated.     Surgical History: Past Surgical History:  Procedure Laterality Date  . ANKLE SURGERY    . Arch aortogram and carotid aortogram   06/02/2006   Dr Oneida Alar did surgery  . BASAL CELL CARCINOMA EXCISION  04/2016   Dermatology  . CARDIAC CATHETERIZATION  01/30/1998    (Dr. Linard Millers): Native LAD & mRCA CTO. 90% D1. Mod Cx. SVG-D1 CTO. SVG-RCA & SVG-OM along with LIMA-LAD patent;  LV NORMAL Fxn  . CAROTID ENDARTERECTOMY Left Oct. 29, 2007   Dr. Oneida Alar:  . CATARACT EXTRACTION W/PHACO Right 03/05/2017   Procedure: CATARACT EXTRACTION PHACO AND INTRAOCULAR LENS PLACEMENT (IOC);  Surgeon: Leandrew Koyanagi, MD;  Location: [facility] ORS;  Service: Ophthalmology;  Laterality: Right;  [place] 00:58.5 AP% 10.6 CDE 6.20 Fluid Pack lot # [id number]  . CATARACT EXTRACTION W/PHACO Left 04/15/2017   Procedure: CATARACT EXTRACTION PHACO AND INTRAOCULAR LENS PLACEMENT ([place])  Left;  Surgeon: Leandrew Koyanagi, MD;  Location: [facility];  Service: Ophthalmology;  Laterality: Left;  . COLONOSCOPY WITH PROPOFOL N/A 09/15/2017   Procedure: COLONOSCOPY WITH PROPOFOL;  Surgeon: Lucilla Lame, MD;  Location: [facility] ENDOSCOPY;  Service: Endoscopy;  Laterality: N/A;  . CORONARY ARTERY BYPASS GRAFT  1998   LIMA-LAD, SVG-OM, SVG-RPDA, SVG-DIAG  . CPET / MET  12/2012   Mild chronotropic incompetence - read 82% of predicted; also reduced effort; peak VO2 15.7 / 75% (did not reach Max effort) -- suggested ischemic response in last 1.5 minutes of exercise. Normal pulmonary function on PFTs but poor response to  . DOPPLER ECHOCARDIOGRAPHY  11/20/2010   EF =>55%; LV norm mild aortic scelorosis  . ESOPHAGOGASTRODUODENOSCOPY (EGD) WITH PROPOFOL N/A 09/15/2017   Procedure: ESOPHAGOGASTRODUODENOSCOPY (EGD) WITH PROPOFOL;  Surgeon: Lucilla Lame, MD;  Location[facility] ENDOSCOPY;  Service: Endoscopy;  Laterality: N/A;  . FRACTURE SURGERY Right 03/19/2015   wrist  . HIATAL HERNIA REPAIR    . ILIAC ARTERY STENT  12/15/2005   PTA and direct stenting rgt and lft common iliac arteries  . NM GATED MYOVIEW ([place] HX)  10/2014   EF 50-55%. Gut attenuation. No Ischemia or  infarction -- LOW RISK  . NM MYOCAR PERF WALL MOTION  11/11/2010; February 2016   a) TM STRESS----NORMAL PERFUSION ,EF 68%; b) EF 50-55%. Normal LV function. No significant ischemia or infarction.  . ORIF ELBOW FRACTURE Right 01/01/2017   Procedure: OPEN REDUCTION INTERNAL FIXATION (ORIF) ELBOW/OLECRANON FRACTURE;  Surgeon: Hessie Knows, MD;  Location: [facility] ORS;  Service: Orthopedics;  Laterality: Right;  . ORIF WRIST FRACTURE Right 03/19/2015   Procedure: OPEN REDUCTION INTERNAL FIXATION (ORIF) WRIST FRACTURE;  Surgeon: Hessie Knows, MD;  Location: [facility] ORS;  Service: Orthopedics;  Laterality: Right;  . THORACIC AORTA - CAROTID ANGIOGRAM  October 2007   Dr. 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 TRANSTHORACIC ECHOCARDIOGRAM  February 2016   [facility]: Normal LV function. Dilated left atrium.    Home Medications:  Allergies as of 02/25/2018      Reactions   Clonazepam Other (See Comments)   Altered mental status   Codeine Other (See Comments)   Altered mental status.   Niacin And Related Other (See Comments)   Flushing of skin   Tramadol    Other reaction(s): Hallucination   Hydrocodone    confusion   Oxycodone Other (See Comments)   confusion confusion      Medication List        Accurate as of 02/25/18 12:37 PM. Always use your most recent med list.          acetaminophen 500 MG tablet Commonly known as:  TYLENOL Take 500-1,000 mg by mouth every 6 (six) hours as needed (for pain.).   ADVAIR DISKUS 250-50 MCG/DOSE Aepb Generic drug:  Fluticasone-Salmeterol INHALE 1 PUFF INTO THE LUNGS TWICE DAILY   amitriptyline 25 MG tablet Commonly known as:  ELAVIL Take 25 mg by mouth at bedtime as needed.   apixaban 5 MG Tabs tablet Commonly known as:  ELIQUIS Take 1 tablet (5 mg total) by mouth 2 (two) times daily.   DEXILANT 60 MG capsule Generic drug:  dexlansoprazole TAKE 1 CAPSULE EVERY  DAY   donepezil 10 MG tablet Commonly known as:  ARICEPT Take 10 mg by mouth daily.   ezetimibe 10 MG tablet Commonly known as:  ZETIA TAKE ONE TABLET EVERY DAY   finasteride 5 MG tablet Commonly known as:  PROSCAR Take 5 mg by mouth daily.   fluticasone 50 MCG/ACT nasal spray Commonly known as:  FLONASE Place 2 sprays into both nostrils daily.   lisinopril 2.5 MG tablet Commonly known as:  PRINIVIL,ZESTRIL TAKE ONE TABLET BY MOUTH TWICE DAILY   MAGNESIUM OXIDE PO Take 500 mg by mouth every evening.   metoprolol tartrate 25 MG tablet Commonly known as:  LOPRESSOR TAKE ONE TABLET TWICE DAILY   MYRBETRIQ 50 MG Tb24 tablet Generic drug:  mirabegron ER Take 50 mg by mouth every morning.   nitroGLYCERIN 0.4 MG SL tablet Commonly known as:  NITROSTAT Place  1 tablet (0.4 mg total) under the tongue every 5 (five) minutes as needed for chest pain.   NON FORMULARY Nystatin elixir month wash   nystatin 100000 UNIT/ML suspension Commonly known as:  MYCOSTATIN Take 5 mLs by mouth at bedtime.   PRESERVISION AREDS 2 PO Take by mouth 2 (two) times daily.   rosuvastatin 20 MG tablet Commonly known as:  CRESTOR TAKE ONE TABLET EVERY DAY   tamsulosin 0.4 MG Caps capsule Commonly known as:  FLOMAX Take 0.4 mg by mouth daily.   Vitamin B-12 500 MCG Subl Place 500 mcg under the tongue daily.   vitamin C 1000 MG tablet Take 1,000 mg by mouth daily.   VITAMIN D-3 PO Take 2,000 Units by mouth every evening.       Allergies:  Allergies  Allergen Reactions  . Clonazepam Other (See Comments)    Altered mental status  . Codeine Other (See Comments)    Altered mental status.  . Niacin And Related Other (See Comments)    Flushing of skin  . Tramadol     Other reaction(s): Hallucination  . Hydrocodone     confusion  . Oxycodone Other (See Comments)    confusion confusion    Family History: Family History  Problem Relation Age of Onset  . Ulcers Mother         Peptic  . Dementia Mother   . Alcohol abuse Father     Social History:  reports that he quit smoking about 20 years ago. His smoking use included cigarettes. He has never used smokeless tobacco. He reports that he does not drink alcohol or use drugs.  ROS: UROLOGY Frequent Urination?: Yes Hard to postpone urination?: Yes Burning/pain with urination?: No Get up at night to urinate?: Yes Leakage of urine?: Yes Urine stream starts and stops?: No Trouble starting stream?: No Do you have to strain to urinate?: No Blood in urine?: No Urinary tract infection?: No Sexually transmitted disease?: No Injury to kidneys or bladder?: No Painful intercourse?: No Weak stream?: No Erection problems?: Yes Penile pain?: No  Gastrointestinal Nausea?: No Vomiting?: No Indigestion/heartburn?: Yes Diarrhea?: No Constipation?: No  Constitutional Fever: No Night sweats?: No Weight loss?: No Fatigue?: No  Skin Skin rash/lesions?: No Itching?: No  Eyes Blurred vision?: No Double vision?: No  Ears/Nose/Throat Sore throat?: No Sinus problems?: No  Hematologic/Lymphatic Swollen glands?: No Easy bruising?: Yes  Cardiovascular Leg swelling?: No Chest pain?: No  Respiratory Cough?: No Shortness of breath?: No  Endocrine Excessive thirst?: No  Musculoskeletal Back pain?: Yes Joint pain?: Yes  Neurological Headaches?: No Dizziness?: No  Psychologic Depression?: No Anxiety?: No  Physical Exam: BP 132/74 (BP Location: Left Arm, Patient Position: Sitting, Cuff Size: Large)   Pulse 78   Ht 6' (1.829 m)   Wt 228 lb 14.4 oz (103.8 kg)   BMI 31.04 kg/m   Constitutional:  Alert and oriented, No acute distress. HEENT: [place] AT, moist mucus membranes.  Trachea midline, no masses. Cardiovascular: No clubbing, cyanosis, or edema. Respiratory: Normal respiratory effort, no increased work of breathing. GI: Abdomen is soft, nontender, nondistended, no abdominal masses GU: No CVA  tenderness Lymph: No cervical or inguinal lymphadenopathy. Skin: No rashes, bruises or suspicious lesions. Neurologic: Grossly intact, no focal deficits, moving all 4 extremities. Psychiatric: Normal mood and affect.   Assessment & Plan:   84 year old male with clinical T1c adenocarcinoma the prostate, Gleason 4+4.  He has elected IMRT and will return for fiducial marker placement.  The procedure was discussed in detail including potential risks of bleeding and infection/sepsis.  He is on Eliquis which will need to be discontinued prior to the procedure.  Abbie Sons, [place] [address] [place],  [address] [phone]

## 2018-02-26 ENCOUNTER — Telehealth: Payer: Self-pay

## 2018-02-26 NOTE — Telephone Encounter (Addendum)
    Dr. Ellyn Hack saw Mr. Mcphee on 02/22/2018, his note is pending.  I spoke to Dr. Ellyn Hack on the phone.  Mr. Mazurowski has a recent stress test that was low risk.    He is at acceptable risk for the planned procedure without further cardiac evaluation.  Per Pharmacist evaluation:  Patient with diagnosis of atrial fibrillation on Eliquis for anticoagulation.    Procedure: gold seed markers Date of procedure: 03/24/18  CHADS2-VASc score of  4 (, HTN, AGE,  CAD, AGE,   CrCl 67.4 Platelet count 155  Per office protocol, patient can hold Eliquis for 1 days prior to procedure.    Patient should restart Eliquis on the evening of procedure or day after, at discretion of procedure MD  Rosaria Ferries, PA-C 02/26/2018 1:09 PM [place]   office phone number [phone]   office fax number [phone]

## 2018-02-26 NOTE — Telephone Encounter (Signed)
Patient with diagnosis of atrial fibrillation on Eliquis for anticoagulation.    Procedure: gold seed markers Date of procedure: 03/24/18  CHADS2-VASc score of  4 (, HTN, AGE,  CAD, AGE,   CrCl 67.4 Platelet count 155  Per office protocol, patient can hold Eliquis for 1 days prior to procedure.     Patient should restart Eliquis on the evening of procedure or day after, at discretion of procedure MD

## 2018-02-26 NOTE — Telephone Encounter (Signed)
   [facility] Pre-operative Risk Assessment    Request for surgical clearance:  1. What type of surgery is being performed?  GOLD SEED MARKERS  2. When is this surgery scheduled?   03-24-18  3. What type of clearance is required (medical clearance vs. Pharmacy clearance to hold med vs. Both)? BOTH  4. Are there any medications that need to be held prior to surgery and how long? ELIQUIS   5. Practice name and name of physician performing surgery?   [facility]    6. What is your office phone number [phone]    7.   What is your office fax number [phone]  8.   Anesthesia type (None, local, MAC, general) ? NOT LISTED   Waylan Rocher 02/26/2018, 11:46 AM  _________________________________________________________________   (provider comments below)

## 2018-03-01 ENCOUNTER — Encounter: Payer: Self-pay | Admitting: Cardiology

## 2018-03-01 NOTE — Assessment & Plan Note (Signed)
OK to hold anticoagulation 48 hr pre-procedure for Radioactive Seed Implant/.  See recommendation by our Pharmacist re: holding & restarting.

## 2018-03-01 NOTE — Assessment & Plan Note (Signed)
He probably had a few episodes of A. fib that was somewhat symptomatic leading to the symptoms that took him to the hospital.  He remains on metoprolol and Eliquis.  At this point with episodes not being all that frequent, I think were fine with just to make control; if these episodes become more frequent, may need to consider however,.  Would strongly consider if the clinic consultation to help choose antiarrhythmic beyond potentially Multaq.

## 2018-03-01 NOTE — Telephone Encounter (Signed)
Agree with recommendations re DOAC.  [name], MD

## 2018-03-01 NOTE — Assessment & Plan Note (Signed)
History of the chest discomfort he was feeling was really anginal or if it is simply noticing palpitations. Recent Myoview was negative for ischemia which is reassuring.  He may very well been feeling A. fib which would be a reasonable estimation for his uneasy sensation.  Symptoms seem to have resolved-->   He remains on beta-blocker and ACE inhibitor.  He is not on aspirin or Plavix because of Eliquis.  He is on stable dose of Crestor plus Zetia.

## 2018-03-01 NOTE — Assessment & Plan Note (Signed)
Noted on CPX in the past.  Relatively preserved EF with no active heart failure.  As result, I am leery of pushing beta-blocker more if additional rate control as needed for A. fib.  Would probably in normal sinus rhythm control.

## 2018-03-01 NOTE — Assessment & Plan Note (Signed)
Most recent lipid panel from August last year looked great.  He should be due for close follow-up in August. For now continue current doses of Zetia plus statin and staying as active as possible.

## 2018-03-01 NOTE — Assessment & Plan Note (Signed)
Still supposedly being followed by vascular surgery.  I do not think the symptoms he is describing related to claudication.

## 2018-03-01 NOTE — Assessment & Plan Note (Signed)
His blood pressure is little high today, but he seems a little bit stressed, and his pressures at home are usually better than this.  For now, to avoid any orthostatic hypotension or dehydration related hypotension during his radiation therapy I believe his current medications as is.  Would prefer to allow for mild permissive hypertension during his cancer treatment episodes.

## 2018-03-12 ENCOUNTER — Other Ambulatory Visit: Payer: Self-pay | Admitting: Cardiology

## 2018-03-12 NOTE — Telephone Encounter (Signed)
Medication refill

## 2018-03-22 DIAGNOSIS — H353122 Nonexudative age-related macular degeneration, left eye, intermediate dry stage: Secondary | ICD-10-CM | POA: Diagnosis not present

## 2018-03-24 ENCOUNTER — Ambulatory Visit (INDEPENDENT_AMBULATORY_CARE_PROVIDER_SITE_OTHER): Payer: Medicare Other | Admitting: Urology

## 2018-03-24 ENCOUNTER — Encounter: Payer: Self-pay | Admitting: Urology

## 2018-03-24 VITALS — BP 150/77 | HR 76 | Ht 72.0 in | Wt 228.0 lb

## 2018-03-24 DIAGNOSIS — C61 Malignant neoplasm of prostate: Secondary | ICD-10-CM

## 2018-03-24 MED ORDER — GENTAMICIN SULFATE 40 MG/ML IJ SOLN: 80.0000 mg | Freq: Once | INTRAMUSCULAR | Status: DC

## 2018-03-24 MED ORDER — LEVOFLOXACIN 500 MG PO TABS: 500.0000 mg | ORAL_TABLET | Freq: Once | ORAL | Status: DC

## 2018-03-24 NOTE — Progress Notes (Signed)
03/24/18  CC: gold seed markers  HPI: 84 y.o. male with prostate cancer who presents today for placement of fiducial seed markers in anticipation of his upcoming IMRT with Dr. Baruch Gouty.  Prostate Fiducial Marker Placement Procedure   Informed consent was obtained after discussing risks/benefits of the procedure.  A time out was performed to ensure correct patient identity.  Pre-Procedure: - Gentamicin given prophylactically - PO Levaquin 500 mg also given today  Procedure: -Lidocaine jelly was administered per rectum -Rectal ultrasound probe was placed without difficulty and the prostate visualized - 3 fiducial gold seed markers placed, one at right base, one at left base, one at apex of prostate gland under transrectal ultrasound guidance  Post-Procedure: - Patient tolerated the procedure well - He was counseled to seek immediate medical attention if experiences any severe pain, significant bleeding, or fevers  John Giovanni, MD

## 2018-03-29 ENCOUNTER — Ambulatory Visit
Admission: RE | Admit: 2018-03-29 | Discharge: 2018-03-29 | Disposition: A | Discharge status: Home / Self Care | Payer: Medicare Other | Source: Ambulatory Visit | Attending: Radiation Oncology | Admitting: Radiation Oncology

## 2018-03-29 DIAGNOSIS — C61 Malignant neoplasm of prostate: Secondary | ICD-10-CM | POA: Diagnosis not present

## 2018-03-29 DIAGNOSIS — Z510 Encounter for antineoplastic radiation therapy: Secondary | ICD-10-CM | POA: Diagnosis not present

## 2018-03-30 DIAGNOSIS — Z510 Encounter for antineoplastic radiation therapy: Secondary | ICD-10-CM | POA: Diagnosis not present

## 2018-03-30 DIAGNOSIS — C61 Malignant neoplasm of prostate: Secondary | ICD-10-CM | POA: Diagnosis not present

## 2018-03-31 ENCOUNTER — Other Ambulatory Visit: Payer: Self-pay

## 2018-03-31 ENCOUNTER — Emergency Department
Admission: EM | Admit: 2018-03-31 | Discharge: 2018-03-31 | Disposition: A | Discharge status: Home / Self Care | Payer: Medicare Other | Attending: Emergency Medicine | Admitting: Emergency Medicine

## 2018-03-31 ENCOUNTER — Emergency Department: Payer: Medicare Other

## 2018-03-31 DIAGNOSIS — J449 Chronic obstructive pulmonary disease, unspecified: Secondary | ICD-10-CM | POA: Insufficient documentation

## 2018-03-31 DIAGNOSIS — I10 Essential (primary) hypertension: Secondary | ICD-10-CM | POA: Diagnosis not present

## 2018-03-31 DIAGNOSIS — Z7901 Long term (current) use of anticoagulants: Secondary | ICD-10-CM | POA: Diagnosis not present

## 2018-03-31 DIAGNOSIS — Z79899 Other long term (current) drug therapy: Secondary | ICD-10-CM | POA: Diagnosis not present

## 2018-03-31 DIAGNOSIS — Z87891 Personal history of nicotine dependence: Secondary | ICD-10-CM | POA: Diagnosis not present

## 2018-03-31 DIAGNOSIS — E039 Hypothyroidism, unspecified: Secondary | ICD-10-CM | POA: Diagnosis not present

## 2018-03-31 DIAGNOSIS — R079 Chest pain, unspecified: Secondary | ICD-10-CM | POA: Diagnosis not present

## 2018-03-31 DIAGNOSIS — R0789 Other chest pain: Secondary | ICD-10-CM | POA: Diagnosis not present

## 2018-03-31 LAB — COMPREHENSIVE METABOLIC PANEL
ALT: 16 U/L (ref 0–44)
AST: 27 U/L (ref 15–41)
Albumin: 3.8 g/dL (ref 3.5–5.0)
Alkaline Phosphatase: 82 U/L (ref 38–126)
Anion gap: 8 (ref 5–15)
BUN: 18 mg/dL (ref 8–23)
CO2: 25 mmol/L (ref 22–32)
Calcium: 8.9 mg/dL (ref 8.9–10.3)
Chloride: 110 mmol/L (ref 98–111)
Creatinine, Ser: 1.06 mg/dL (ref 0.61–1.24)
GFR calc Af Amer: >60 mL/min (ref >60)
GFR calc non Af Amer: >60 mL/min (ref >60)
Glucose, Bld: 113 mg/dL — ABNORMAL HIGH (ref 70–99)
Potassium: 3.3 mmol/L — ABNORMAL LOW (ref 3.5–5.1)
Sodium: 143 mmol/L (ref 135–145)
Total Bilirubin: 1 mg/dL (ref 0.3–1.2)
Total Protein: 6.1 g/dL — ABNORMAL LOW (ref 6.5–8.1)

## 2018-03-31 LAB — CBC WITH DIFFERENTIAL/PLATELET
Basophils Absolute: 0.1 10*3/uL (ref 0–0.1)
Basophils Relative: 1 %
Eosinophils Absolute: 0.2 10*3/uL (ref 0–0.7)
Eosinophils Relative: 2 %
HCT: 39.8 % — ABNORMAL LOW (ref 40.0–52.0)
Hemoglobin: 13.6 g/dL (ref 13.0–18.0)
Lymphocytes Relative: 19 %
Lymphs Abs: 2.2 10*3/uL (ref 1.0–3.6)
MCH: 31.9 pg (ref 26.0–34.0)
MCHC: 34.2 g/dL (ref 32.0–36.0)
MCV: 93.3 fL (ref 80.0–100.0)
Monocytes Absolute: 1.2 10*3/uL — ABNORMAL HIGH (ref 0.2–1.0)
Monocytes Relative: 11 %
Neutro Abs: 7.6 10*3/uL — ABNORMAL HIGH (ref 1.4–6.5)
Neutrophils Relative %: 67 %
Platelets: 160 10*3/uL (ref 150–440)
RBC: 4.26 MIL/uL — ABNORMAL LOW (ref 4.40–5.90)
RDW: 14.6 % — ABNORMAL HIGH (ref 11.5–14.5)
WBC: 11.2 10*3/uL — ABNORMAL HIGH (ref 3.8–10.6)

## 2018-03-31 LAB — TROPONIN I
Troponin I: <0.03 ng/mL (ref <0.03)
Troponin I: <0.03 ng/mL (ref <0.03)

## 2018-03-31 MED ORDER — SODIUM CHLORIDE 0.9 % IV BOLUS
500.0000 mL | Freq: Once | INTRAVENOUS | Status: AC
Start: 2018-03-31 — End: 2018-03-31
  Administered 2018-03-31: 500 mL via INTRAVENOUS

## 2018-03-31 NOTE — Discharge Instructions (Signed)
Please continue all your medicines.  Please follow-up with your cardiologist in the next few days.  Please return here for any further chest pain.

## 2018-03-31 NOTE — ED Notes (Signed)
ED Provider at bedside. 

## 2018-03-31 NOTE — ED Provider Notes (Signed)
[facility] Emergency Department Provider Note   ____________________________________________   First MD Initiated Contact with Patient 03/31/18 1019     (approximate)  I have reviewed the triage vital signs and the nursing notes.   HISTORY  Chief Complaint Chest Pain   HPI [name] is a 84 y.o. male who reports he had onset of chest pressure and tightness with some shortness of breath.  It happened after he got up.  It is gone now.  It went away with rest.  Unable to determine if anything seem to bring it on.  Lasted 10 to 15 minutes.  There does not seem to be any nausea associated with it   Past Medical History:  Diagnosis Date  . Arthritis   . Atherosclerotic heart disease of native coronary artery without angina pectoris 1998   - s/p CABG, occluded SVG-D1; patent LIMA-LAD, SVG-OM, SVG- RCA  . Basal cell carcinoma of eyelid 01/03/2013   2019 - R side of Nose  . Cancer [facility])    Skin cancer  . Colon polyps   . COPD (chronic obstructive pulmonary disease) ([place])   . GERD (gastroesophageal reflux disease)   . Hemorrhoid 02/10/2015  . History of hiatal hernia   . HOH (hard of hearing)   . Hypertension   . Memory loss   . Osteoporosis   . PAF (paroxysmal atrial fibrillation) ([place])   . Prostate disorder   . Sciatic pain    Chronic  . Sleep apnea    cpap  . Temporary cerebral vascular dysfunction 02/10/2015   Had negative work-up.  July, 2012.  No medication changes, had forgot them that day, got dehydrated.     Patient Active Problem List   Diagnosis Date Noted  . Prostate cancer ([place]) 02/25/2018  . Blood in stool   . Abnormal feces   . Stricture and stenosis of esophagus   . Benign neoplasm of ascending colon   . Benign neoplasm of descending colon   . Senile purpura ([place]) 04/29/2017  . Anemia 04/29/2017  . Frequent falls 04/29/2017  . Benign localized hyperplasia of prostate with urinary obstruction 07/15/2016  . Elevated PSA  07/15/2016  . Incomplete emptying of bladder 07/15/2016  . Urge incontinence 07/15/2016  . Hypothyroidism 04/25/2016  . Macular degeneration 04/25/2016  . Erectile dysfunction due to arterial insufficiency   . Ischemic heart disease with chronotropic incompetence   . CN (constipation) 02/10/2015  . DD (diverticular disease) 02/10/2015  . Accumulation of fluid in tissues 02/10/2015  . Elevated WBC count 02/10/2015  . Fatigue 02/10/2015  . Hypoxia 02/10/2015  . Lichen planus 16/06/9603  . Restless leg 02/10/2015  . 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 B12 deficiency 02/10/2015  . Lower extremity edema 12/14/2014  . COPD (chronic obstructive pulmonary disease) ([place]) 11/14/2014  . Post Inflammatory Lung Changes 11/14/2014  . Confusion state 11/08/2014  . Amnesia 11/08/2014  . PAF (paroxysmal atrial fibrillation) (HCC) CHA2DS2-VASc = 4. AC = Eliquis   . Insomnia 12/09/2013  . OSA on CPAP   . Erectile dysfunction   . Dyspnea on exertion - -essentially resolved with BB dose reduction & wgt loss 03/29/2013  . Overweight (BMI 25.0-29.9) -- Wgt back up 01/17/2013  . Atherosclerotic heart disease of native coronary artery without angina pectoris - s/p CABG, occluded SVG-D1; patent LIMA-LAD, SVG-OM, SVG- RCA   . Essential hypertension   . Hyperlipidemia with target LDL less than 70   . Aorto-iliac disease ([place])   .  BCC (basal cell carcinoma), eyelid 01/03/2013  . Allergic rhinitis 08/17/2009  . Arthralgia of ankle or foot 06/04/2009  . Adaptation reaction 05/28/2009  . Benign prostatic hypertrophy without urinary obstruction 05/28/2009  . Acid reflux 05/28/2009  . Arthritis, degenerative 05/28/2009  . 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 Carotid artery occlusion without infarction 06/01/2006  . PAD (peripheral artery disease) - bilateral common iliac stents 12/15/2005  . Aortoiliac occlusive disease (HCC) 11/30/2005  . Atherosclerotic heart disease of artery bypass graft  09/01/1997    Past Surgical History:  Procedure Laterality Date  . ANKLE SURGERY    . Arch aortogram and carotid aortogram  06/02/2006   Dr Fields did surgery  . BASAL CELL CARCINOMA EXCISION  04/2016   Dermatology  . CARDIAC CATHETERIZATION  01/30/1998    (Dr. H. Smith): Native LAD & mRCA CTO. 90% D1. Mod Cx. SVG-D1 CTO. SVG-RCA & SVG-OM along with LIMA-LAD patent;  LV NORMAL Fxn  . CAROTID ENDARTERECTOMY Left Oct. 29, 2007   Dr. Fields:  . CATARACT EXTRACTION W/PHACO Right 03/05/2017   Procedure: CATARACT EXTRACTION PHACO AND INTRAOCULAR LENS PLACEMENT (IOC);  Surgeon: Brasington, Chadwick, MD;  Location: [facility] ORS;  Service: Ophthalmology;  Laterality: Right;  US 00:58.5 AP% 10.6 CDE 6.20 Fluid Pack lot # [id number]  . CATARACT EXTRACTION W/PHACO Left 04/15/2017   Procedure: CATARACT EXTRACTION PHACO AND INTRAOCULAR LENS PLACEMENT (IOC)  Left;  Surgeon: Brasington, Chadwick, MD;  Location: [facility];  Service: Ophthalmology;  Laterality: Left;  . COLONOSCOPY WITH PROPOFOL N/A 09/15/2017   Procedure: COLONOSCOPY WITH PROPOFOL;  Surgeon: Wohl, Darren, MD;  Location: [facility] ENDOSCOPY;  Service: Endoscopy;  Laterality: N/A;  . CORONARY ARTERY BYPASS GRAFT  1998   LIMA-LAD, SVG-OM, SVG-RPDA, SVG-DIAG  . CPET / MET  12/2012   Mild chronotropic incompetence - read 82% of predicted; also reduced effort; peak VO2 15.7 / 75% (did not reach Max effort) -- suggested ischemic response in last 1.5 minutes of exercise. Normal pulmonary function on PFTs but poor response to  . DOPPLER ECHOCARDIOGRAPHY  11/20/2010   EF =>55%; LV norm mild aortic scelorosis  . ESOPHAGOGASTRODUODENOSCOPY (EGD) WITH PROPOFOL N/A 09/15/2017   Procedure: ESOPHAGOGASTRODUODENOSCOPY (EGD) WITH PROPOFOL;  Surgeon: Wohl, Darren, MD;  Location: [facility] END[facility]COPY;  Service: Endoscopy;  Laterality: N/A;  . FRACTURE SURGERY Right 03/19/2015   wrist  . HIATAL HERNIA REPAIR    . ILIAC ARTERY STENT  12/15/2005   PTA and direct  stenting rgt and lft common iliac arteries  . NM GATED MYOVIEW ([facility] HX)  12/2017   Low Risk - no ischemia or infarct.  EF > 65%. no RWMA  . NM MYOCAR PERF WALL MOTION  11/11/2010; February 2016   a) TM STRESS----NORMAL PERFUSION ,EF 68%; b) EF 50-55%. Normal LV function. No significant ischemia or infarction.  . ORIF ELBOW FRACTURE Right 01/01/2017   Procedure: OPEN REDUCTION INTERNAL FIXATION (ORIF) ELBOW/OLECRANON FRACTURE;  Surgeon: Michael Menz, MD;  Location: [facility] ORS;  Service: Orthopedics;  Laterality: Right;  . ORIF WRIST FRACTURE Right 03/19/2015   Procedure: OPEN REDUCTION INTERNAL FIXATION (ORIF) WRIST FRACTURE;  Surgeon: Michael Menz, MD;  Location: [facility] ORS;  Service: Orthopedics;  Laterality: Right;  . THORACIC AORTA - CAROTID ANGIOGRAM  October 2007   Dr. 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 TRANSTHORACIC ECHOCARDIOGRAM  February 2016   [facility]: Normal LV function. Dilated left atrium.    Prior to Admission   medications   Medication Sig Start Date End Date Taking? Authorizing Provider  ADVAIR DISKUS 250-50 MCG/DOSE AEPB INHALE 1 PUFF INTO THE LUNGS TWICE DAILY 08/17/17  Yes Ramachandran, Pradeep, MD  apixaban (ELIQUIS) 5 MG TABS tablet Take 1 tablet (5 mg total) by mouth 2 (two) times daily. 02/22/18  Yes Harding, David W, MD  Ascorbic Acid (VITAMIN C) 1000 MG tablet Take 1,000 mg by mouth daily.   Yes [provider]  Cholecalciferol (VITAMIN D-3 PO) Take 2,000 Units by mouth every evening.    Yes [provider]  Cyanocobalamin (VITAMIN B-12) 500 MCG SUBL Place 500 mcg under the tongue daily.    Yes [provider]  DEXILANT 60 MG capsule TAKE 1 CAPSULE EVERY DAY 01/11/18  Yes Fisher, Donald E, MD  donepezil (ARICEPT) 10 MG tablet Take 10 mg by mouth at bedtime.  11/18/17  Yes [provider]  ezetimibe (ZETIA) 10 MG tablet TAKE ONE TABLET EVERY DAY 03/18/17   Yes Fisher, Donald E, MD  finasteride (PROSCAR) 5 MG tablet Take 5 mg by mouth daily. 07/15/16  Yes [provider]  fluticasone (FLONASE) 50 MCG/ACT nasal spray Place 2 sprays into both nostrils daily. Patient taking differently: Place 2 sprays into both nostrils daily as needed for allergies.  01/12/17  Yes Fisher, Donald E, MD  lisinopril (PRINIVIL,ZESTRIL) 2.5 MG tablet TAKE ONE TABLET BY MOUTH TWICE DAILY 08/15/17  Yes Fisher, Donald E, MD  MAGNESIUM OXIDE PO Take 500 mg by mouth every evening.    Yes [provider]  metoprolol tartrate (LOPRESSOR) 25 MG tablet TAKE ONE TABLET BY MOUTH TWICE DAILY 03/12/18  Yes Harding, David W, MD  mirabegron ER (MYRBETRIQ) 50 MG TB24 tablet Take 50 mg by mouth at bedtime.    Yes [provider]  Multiple Vitamins-Minerals (PRESERVISION AREDS 2 PO) Take 1 tablet by mouth 2 (two) times daily.    Yes [provider]  nitroGLYCERIN (NITROSTAT) 0.4 MG SL tablet Place 1 tablet (0.4 mg total) under the tongue every 5 (five) minutes as needed for chest pain. 01/12/17  Yes Fisher, Donald E, MD  NON FORMULARY Swish and spit 1 Dose at bedtime. Nystatin elixir month wash    Yes [provider]  nystatin (MYCOSTATIN) 100000 UNIT/ML suspension Use as directed 5 mLs in the mouth or throat daily as needed (irritation).    Yes [provider]  rosuvastatin (CRESTOR) 20 MG tablet TAKE ONE TABLET EVERY DAY 06/12/17  Yes Fisher, Donald E, MD  tamsulosin (FLOMAX) 0.4 MG CAPS capsule Take 0.4 mg by mouth daily. 07/15/16  Yes [provider]  acetaminophen (TYLENOL) 500 MG tablet Take 500-1,000 mg by mouth every 6 (six) hours as needed (for pain.).    [provider]    Allergies Clonazepam; Codeine; Niacin and related; Tramadol; Hydrocodone; and Oxycodone  Family History  Problem Relation Age of Onset  . Ulcers Mother        Peptic  . Dementia Mother   . Alcohol abuse Father     Social History Social  History   Tobacco Use  . Smoking status: Former Smoker    Types: Cigarettes    Last attempt to quit: 08/04/1997    Years since quitting: 20.6  . Smokeless tobacco: Never Used  Substance Use Topics  . Alcohol use: No  . Drug use: No    Review of Systems  Constitutional: No fever/chills Eyes: No visual changes. ENT: No sore throat. Cardiovascular:  chest pain. Respiratory:    shortness of breath. Gastrointestinal: No abdominal pain.  No nausea, no vomiting.  No diarrhea.  No constipation. Genitourinary: Negative for dysuria. Musculoskeletal: Negative for back pain. Skin: Negative for rash. Neurological: Negative for headaches, focal weakness  ____________________________________________   PHYSICAL EXAM:  VITAL SIGNS: ED Triage Vitals  Enc Vitals Group     BP 03/31/18 1003 (!) 104/49     Pulse Rate 03/31/18 1003 88     Resp 03/31/18 1003 17     Temp 03/31/18 1003 97.7 F (36.5 C)     Temp Source 03/31/18 1003 Oral     SpO2 03/31/18 1003 90 %     Weight 03/31/18 1004 228 lb (103.4 kg)     Height 03/31/18 1004 6' (1.829 m)     Head Circumference --      Peak Flow --      Pain Score 03/31/18 1004 0     Pain Loc --      Pain Edu? --      Excl. in [place]? --     Constitutional: Alert and oriented. Well appearing and in no acute distress. Eyes: Conjunctivae are normal.  Head: Atraumatic. Nose: No congestion/rhinnorhea. Mouth/Throat: Mucous membranes are moist.  Oropharynx non-erythematous. Neck: No stridor.   Cardiovascular: Normal rate, regular rhythm. Grossly normal heart sounds.  Good peripheral circulation. Respiratory: Normal respiratory effort.  No retractions. Lungs CTAB. Gastrointestinal: Soft and nontender. No distention. No abdominal bruits. No CVA tenderness. Musculoskeletal: No lower extremity tenderness mild bilateral edema.  . Neurologic:  Normal speech and language. No gross focal neurologic deficits are appreciated.  Skin:  Skin is warm, dry and intact.  No rash noted. Psychiatric: Mood and affect are normal. Speech and behavior are normal.  ____________________________________________   LABS (all labs ordered are listed, but only abnormal results are displayed)  Labs Reviewed  COMPREHENSIVE METABOLIC PANEL - Abnormal; Notable for the following components:      Result Value   Potassium 3.3 (*)    Glucose, Bld 113 (*)    Total Protein 6.1 (*)    All other components within normal limits  CBC WITH DIFFERENTIAL/PLATELET - Abnormal; Notable for the following components:   WBC 11.2 (*)    RBC 4.26 (*)    HCT 39.8 (*)    RDW 14.6 (*)    Neutro Abs 7.6 (*)    Monocytes Absolute 1.2 (*)    All other components within normal limits  TROPONIN I  TROPONIN I   ____________________________________________  EKG  EKG read and interpreted by me shows A. fib at a rate of 88 incomplete right bundle branch block similar to EKG from 28 April of this year ____________________________________________  RADIOLOGY  ED MD interpretation: Chest x-ray read by radiology as no acute disease I reviewed the film  Official radiology report(s): Dg Chest Portable 1 View  Result Date: 03/31/2018 CLINICAL DATA:  Sudden onset chest pressure/tightness with shortness of breath this morning, now resolved. History of COPD. EXAM: PORTABLE CHEST 1 VIEW COMPARISON:  12/27/2017 FINDINGS: Sequelae of prior CABG are again identified. The cardiomediastinal silhouette is unchanged with normal heart size. Diffuse interstitial coarsening is similar to the prior study. No acute airspace consolidation, sizeable pleural effusion, or pneumothorax is identified. A calcified right apical lung nodule is unchanged. No acute osseous abnormality is seen. IMPRESSION: Unchanged interstitial coarsening without evidence of acute cardiopulmonary process. Electronically Signed   By: Logan Bores M.D.   On: 03/31/2018 10:44     ____________________________________________  PROCEDURES  Procedure(s) performed:   Procedures  Critical Care performed:   ____________________________________________   INITIAL IMPRESSION / ASSESSMENT AND PLAN / ED COURSE  Patient's troponins are negative EKG looks unchanged I am uncertain what the chest pain may have been.  His blood pressure initially was lower than usual discussed him with the hospitalist given 500 cc of fluid his pressure went up and is stayed up since.  He says he feels fine and has been feeling fine basically since he got here.  We will let him go ahead and follow-up with his cardiologist to return for any further problems at all.        ____________________________________________   FINAL CLINICAL IMPRESSION(S) / ED DIAGNOSES  Final diagnoses:  Nonspecific chest pain     ED Discharge Orders    None       Note:  This document was prepared using Dragon voice recognition software and may include unintentional dictation errors.    Nena Polio, MD 03/31/18 1451

## 2018-03-31 NOTE — ED Triage Notes (Signed)
Pt states after getting up this morning he had sudden onset chest pressure/tightness with SOB. States it lasted about 10-65min. Denies any discomfort at present. Pt is hypotensive in triage.

## 2018-03-31 NOTE — ED Notes (Signed)
Report to Christina, RN

## 2018-04-01 ENCOUNTER — Telehealth: Payer: Self-pay | Admitting: Cardiology

## 2018-04-01 NOTE — Telephone Encounter (Signed)
Yesterday pt had sob , pressure on chest, and feeling bad. Wife took BP but it wouldn't register. Pt went to ED and report he was in Afib, oxygen had dropped, and BP was low. Pt was given 500 cc of fluid. Pt was discharged an told to contact cardiologist. Wife states pt  is feeling much better but didnt know if Dr. Ellyn Hack feels pts needs to be seen. Routing to MD.

## 2018-04-01 NOTE — Telephone Encounter (Signed)
Probably is OK - but maybe have him come in to see me in September if he has any more Sx.  [name], MD

## 2018-04-01 NOTE — Telephone Encounter (Signed)
New Message   Pt's wife calling wanting to let Dr. Ellyn Hack know that the the pt had an Afib event , he went to the er and his blood pressure and oxygen was low.

## 2018-04-02 ENCOUNTER — Other Ambulatory Visit: Payer: Self-pay | Admitting: *Deleted

## 2018-04-02 DIAGNOSIS — C61 Malignant neoplasm of prostate: Secondary | ICD-10-CM

## 2018-04-05 NOTE — Telephone Encounter (Signed)
Left message to call back  

## 2018-04-05 NOTE — Telephone Encounter (Signed)
Follow up  ° ° °Patient is returning call.  °

## 2018-04-05 NOTE — Telephone Encounter (Signed)
Pt advised Dr. Allison Quarry advise and will call and move up his 12/19 appointment if he develops any further problems. Pt says he has been feeling well.

## 2018-04-07 ENCOUNTER — Ambulatory Visit
Admission: RE | Admit: 2018-04-07 | Discharge: 2018-04-07 | Disposition: A | Discharge status: Home / Self Care | Payer: Medicare Other | Source: Ambulatory Visit | Attending: Radiation Oncology | Admitting: Radiation Oncology

## 2018-04-07 DIAGNOSIS — Z510 Encounter for antineoplastic radiation therapy: Secondary | ICD-10-CM | POA: Insufficient documentation

## 2018-04-07 DIAGNOSIS — C61 Malignant neoplasm of prostate: Secondary | ICD-10-CM | POA: Insufficient documentation

## 2018-04-08 ENCOUNTER — Ambulatory Visit
Admission: RE | Admit: 2018-04-08 | Discharge: 2018-04-08 | Disposition: A | Discharge status: Home / Self Care | Payer: Medicare Other | Source: Ambulatory Visit | Attending: Radiation Oncology | Admitting: Radiation Oncology

## 2018-04-08 DIAGNOSIS — C61 Malignant neoplasm of prostate: Secondary | ICD-10-CM | POA: Diagnosis not present

## 2018-04-08 DIAGNOSIS — Z510 Encounter for antineoplastic radiation therapy: Secondary | ICD-10-CM | POA: Diagnosis not present

## 2018-04-09 ENCOUNTER — Ambulatory Visit
Admission: RE | Admit: 2018-04-09 | Discharge: 2018-04-09 | Disposition: A | Discharge status: Home / Self Care | Payer: Medicare Other | Source: Ambulatory Visit | Attending: Radiation Oncology | Admitting: Radiation Oncology

## 2018-04-09 DIAGNOSIS — Z510 Encounter for antineoplastic radiation therapy: Secondary | ICD-10-CM | POA: Diagnosis not present

## 2018-04-09 DIAGNOSIS — C61 Malignant neoplasm of prostate: Secondary | ICD-10-CM | POA: Diagnosis not present

## 2018-04-12 ENCOUNTER — Ambulatory Visit
Admission: RE | Admit: 2018-04-12 | Discharge: 2018-04-12 | Disposition: A | Discharge status: Home / Self Care | Payer: Medicare Other | Source: Ambulatory Visit | Attending: Radiation Oncology | Admitting: Radiation Oncology

## 2018-04-12 DIAGNOSIS — C61 Malignant neoplasm of prostate: Secondary | ICD-10-CM | POA: Diagnosis not present

## 2018-04-12 DIAGNOSIS — Z510 Encounter for antineoplastic radiation therapy: Secondary | ICD-10-CM | POA: Diagnosis not present

## 2018-04-13 ENCOUNTER — Other Ambulatory Visit: Payer: Self-pay | Admitting: Family Medicine

## 2018-04-13 ENCOUNTER — Ambulatory Visit
Admission: RE | Admit: 2018-04-13 | Discharge: 2018-04-13 | Disposition: A | Discharge status: Home / Self Care | Payer: Medicare Other | Source: Ambulatory Visit | Attending: Radiation Oncology | Admitting: Radiation Oncology

## 2018-04-13 DIAGNOSIS — E785 Hyperlipidemia, unspecified: Secondary | ICD-10-CM

## 2018-04-13 DIAGNOSIS — Z510 Encounter for antineoplastic radiation therapy: Secondary | ICD-10-CM | POA: Diagnosis not present

## 2018-04-13 DIAGNOSIS — C61 Malignant neoplasm of prostate: Secondary | ICD-10-CM | POA: Diagnosis not present

## 2018-04-14 ENCOUNTER — Ambulatory Visit
Admission: RE | Admit: 2018-04-14 | Discharge: 2018-04-14 | Disposition: A | Discharge status: Home / Self Care | Payer: Medicare Other | Source: Ambulatory Visit | Attending: Radiation Oncology | Admitting: Radiation Oncology

## 2018-04-14 DIAGNOSIS — C61 Malignant neoplasm of prostate: Secondary | ICD-10-CM | POA: Diagnosis not present

## 2018-04-14 DIAGNOSIS — Z510 Encounter for antineoplastic radiation therapy: Secondary | ICD-10-CM | POA: Diagnosis not present

## 2018-04-15 ENCOUNTER — Ambulatory Visit
Admission: RE | Admit: 2018-04-15 | Discharge: 2018-04-15 | Disposition: A | Discharge status: Home / Self Care | Payer: Medicare Other | Source: Ambulatory Visit | Attending: Radiation Oncology | Admitting: Radiation Oncology

## 2018-04-15 DIAGNOSIS — C61 Malignant neoplasm of prostate: Secondary | ICD-10-CM | POA: Diagnosis not present

## 2018-04-15 DIAGNOSIS — Z510 Encounter for antineoplastic radiation therapy: Secondary | ICD-10-CM | POA: Diagnosis not present

## 2018-04-16 ENCOUNTER — Ambulatory Visit
Admission: RE | Admit: 2018-04-16 | Discharge: 2018-04-16 | Disposition: A | Discharge status: Home / Self Care | Payer: Medicare Other | Source: Ambulatory Visit | Attending: Radiation Oncology | Admitting: Radiation Oncology

## 2018-04-16 DIAGNOSIS — C61 Malignant neoplasm of prostate: Secondary | ICD-10-CM | POA: Diagnosis not present

## 2018-04-16 DIAGNOSIS — Z510 Encounter for antineoplastic radiation therapy: Secondary | ICD-10-CM | POA: Diagnosis not present

## 2018-04-18 ENCOUNTER — Ambulatory Visit: Admission: RE | Admit: 2018-04-18 | Payer: Medicare Other | Source: Ambulatory Visit

## 2018-04-19 ENCOUNTER — Ambulatory Visit: Payer: Medicare Other

## 2018-04-19 ENCOUNTER — Ambulatory Visit
Admission: RE | Admit: 2018-04-19 | Discharge: 2018-04-19 | Disposition: A | Discharge status: Home / Self Care | Payer: Medicare Other | Source: Ambulatory Visit | Attending: Radiation Oncology | Admitting: Radiation Oncology

## 2018-04-19 DIAGNOSIS — C61 Malignant neoplasm of prostate: Secondary | ICD-10-CM | POA: Diagnosis not present

## 2018-04-19 DIAGNOSIS — Z510 Encounter for antineoplastic radiation therapy: Secondary | ICD-10-CM | POA: Diagnosis not present

## 2018-04-20 ENCOUNTER — Ambulatory Visit
Admission: RE | Admit: 2018-04-20 | Discharge: 2018-04-20 | Disposition: A | Discharge status: Home / Self Care | Payer: Medicare Other | Source: Ambulatory Visit | Attending: Radiation Oncology | Admitting: Radiation Oncology

## 2018-04-20 DIAGNOSIS — C61 Malignant neoplasm of prostate: Secondary | ICD-10-CM | POA: Diagnosis not present

## 2018-04-20 DIAGNOSIS — Z510 Encounter for antineoplastic radiation therapy: Secondary | ICD-10-CM | POA: Diagnosis not present

## 2018-04-21 ENCOUNTER — Ambulatory Visit
Admission: RE | Admit: 2018-04-21 | Discharge: 2018-04-21 | Disposition: A | Discharge status: Home / Self Care | Payer: Medicare Other | Source: Ambulatory Visit | Attending: Radiation Oncology | Admitting: Radiation Oncology

## 2018-04-21 DIAGNOSIS — C61 Malignant neoplasm of prostate: Secondary | ICD-10-CM | POA: Diagnosis not present

## 2018-04-21 DIAGNOSIS — Z510 Encounter for antineoplastic radiation therapy: Secondary | ICD-10-CM | POA: Diagnosis not present

## 2018-04-22 ENCOUNTER — Inpatient Hospital Stay: Payer: Medicare Other | Attending: Radiation Oncology

## 2018-04-22 ENCOUNTER — Ambulatory Visit
Admission: RE | Admit: 2018-04-22 | Discharge: 2018-04-22 | Disposition: A | Discharge status: Home / Self Care | Payer: Medicare Other | Source: Ambulatory Visit | Attending: Radiation Oncology | Admitting: Radiation Oncology

## 2018-04-22 DIAGNOSIS — C61 Malignant neoplasm of prostate: Secondary | ICD-10-CM

## 2018-04-22 DIAGNOSIS — Z510 Encounter for antineoplastic radiation therapy: Secondary | ICD-10-CM | POA: Diagnosis not present

## 2018-04-22 LAB — CBC
HCT: 40.2 % (ref 40.0–52.0)
Hemoglobin: 13.7 g/dL (ref 13.0–18.0)
MCH: 31.8 pg (ref 26.0–34.0)
MCHC: 34 g/dL (ref 32.0–36.0)
MCV: 93.6 fL (ref 80.0–100.0)
Platelets: 142 10*3/uL — ABNORMAL LOW (ref 150–440)
RBC: 4.29 MIL/uL — ABNORMAL LOW (ref 4.40–5.90)
RDW: 14.5 % (ref 11.5–14.5)
WBC: 8.3 10*3/uL (ref 3.8–10.6)

## 2018-04-23 ENCOUNTER — Ambulatory Visit
Admission: RE | Admit: 2018-04-23 | Discharge: 2018-04-23 | Disposition: A | Discharge status: Home / Self Care | Payer: Medicare Other | Source: Ambulatory Visit | Attending: Radiation Oncology | Admitting: Radiation Oncology

## 2018-04-23 DIAGNOSIS — Z510 Encounter for antineoplastic radiation therapy: Secondary | ICD-10-CM | POA: Diagnosis not present

## 2018-04-23 DIAGNOSIS — C61 Malignant neoplasm of prostate: Secondary | ICD-10-CM | POA: Diagnosis not present

## 2018-04-26 ENCOUNTER — Ambulatory Visit
Admission: RE | Admit: 2018-04-26 | Discharge: 2018-04-26 | Disposition: A | Discharge status: Home / Self Care | Payer: Medicare Other | Source: Ambulatory Visit | Attending: Radiation Oncology | Admitting: Radiation Oncology

## 2018-04-26 DIAGNOSIS — Z510 Encounter for antineoplastic radiation therapy: Secondary | ICD-10-CM | POA: Diagnosis not present

## 2018-04-26 DIAGNOSIS — C61 Malignant neoplasm of prostate: Secondary | ICD-10-CM | POA: Diagnosis not present

## 2018-04-27 ENCOUNTER — Other Ambulatory Visit: Payer: Self-pay | Admitting: *Deleted

## 2018-04-27 ENCOUNTER — Ambulatory Visit
Admission: RE | Admit: 2018-04-27 | Discharge: 2018-04-27 | Disposition: A | Discharge status: Home / Self Care | Payer: Medicare Other | Source: Ambulatory Visit | Attending: Radiation Oncology | Admitting: Radiation Oncology

## 2018-04-27 DIAGNOSIS — C61 Malignant neoplasm of prostate: Secondary | ICD-10-CM

## 2018-04-27 DIAGNOSIS — Z510 Encounter for antineoplastic radiation therapy: Secondary | ICD-10-CM | POA: Diagnosis not present

## 2018-04-28 ENCOUNTER — Ambulatory Visit
Admission: RE | Admit: 2018-04-28 | Discharge: 2018-04-28 | Disposition: A | Discharge status: Home / Self Care | Payer: Medicare Other | Source: Ambulatory Visit | Attending: Radiation Oncology | Admitting: Radiation Oncology

## 2018-04-28 DIAGNOSIS — M24574 Contracture, right foot: Secondary | ICD-10-CM | POA: Diagnosis not present

## 2018-04-28 DIAGNOSIS — Z510 Encounter for antineoplastic radiation therapy: Secondary | ICD-10-CM | POA: Diagnosis not present

## 2018-04-28 DIAGNOSIS — C61 Malignant neoplasm of prostate: Secondary | ICD-10-CM | POA: Diagnosis not present

## 2018-04-29 ENCOUNTER — Ambulatory Visit
Admission: RE | Admit: 2018-04-29 | Discharge: 2018-04-29 | Disposition: A | Discharge status: Home / Self Care | Payer: Medicare Other | Source: Ambulatory Visit | Attending: Radiation Oncology | Admitting: Radiation Oncology

## 2018-04-29 DIAGNOSIS — C61 Malignant neoplasm of prostate: Secondary | ICD-10-CM | POA: Diagnosis not present

## 2018-04-29 DIAGNOSIS — Z510 Encounter for antineoplastic radiation therapy: Secondary | ICD-10-CM | POA: Diagnosis not present

## 2018-04-30 ENCOUNTER — Ambulatory Visit
Admission: RE | Admit: 2018-04-30 | Discharge: 2018-04-30 | Disposition: A | Discharge status: Home / Self Care | Payer: Medicare Other | Source: Ambulatory Visit | Attending: Radiation Oncology | Admitting: Radiation Oncology

## 2018-04-30 DIAGNOSIS — S80812A Abrasion, left lower leg, initial encounter: Secondary | ICD-10-CM | POA: Diagnosis not present

## 2018-04-30 DIAGNOSIS — C61 Malignant neoplasm of prostate: Secondary | ICD-10-CM | POA: Diagnosis not present

## 2018-04-30 DIAGNOSIS — Z510 Encounter for antineoplastic radiation therapy: Secondary | ICD-10-CM | POA: Diagnosis not present

## 2018-04-30 DIAGNOSIS — L089 Local infection of the skin and subcutaneous tissue, unspecified: Secondary | ICD-10-CM | POA: Diagnosis not present

## 2018-05-04 ENCOUNTER — Ambulatory Visit
Admission: RE | Admit: 2018-05-04 | Discharge: 2018-05-04 | Disposition: A | Discharge status: Home / Self Care | Payer: Medicare Other | Source: Ambulatory Visit | Attending: Radiation Oncology | Admitting: Radiation Oncology

## 2018-05-04 DIAGNOSIS — C61 Malignant neoplasm of prostate: Secondary | ICD-10-CM | POA: Insufficient documentation

## 2018-05-04 DIAGNOSIS — Z510 Encounter for antineoplastic radiation therapy: Secondary | ICD-10-CM | POA: Insufficient documentation

## 2018-05-05 ENCOUNTER — Ambulatory Visit
Admission: RE | Admit: 2018-05-05 | Discharge: 2018-05-05 | Disposition: A | Discharge status: Home / Self Care | Payer: Medicare Other | Source: Ambulatory Visit | Attending: Radiation Oncology | Admitting: Radiation Oncology

## 2018-05-05 DIAGNOSIS — Z510 Encounter for antineoplastic radiation therapy: Secondary | ICD-10-CM | POA: Diagnosis not present

## 2018-05-05 DIAGNOSIS — C61 Malignant neoplasm of prostate: Secondary | ICD-10-CM | POA: Diagnosis not present

## 2018-05-06 ENCOUNTER — Ambulatory Visit
Admission: RE | Admit: 2018-05-06 | Discharge: 2018-05-06 | Disposition: A | Discharge status: Home / Self Care | Payer: Medicare Other | Source: Ambulatory Visit | Attending: Radiation Oncology | Admitting: Radiation Oncology

## 2018-05-06 ENCOUNTER — Inpatient Hospital Stay: Payer: Medicare Other | Attending: Radiation Oncology

## 2018-05-06 DIAGNOSIS — Z510 Encounter for antineoplastic radiation therapy: Secondary | ICD-10-CM | POA: Diagnosis not present

## 2018-05-06 DIAGNOSIS — C61 Malignant neoplasm of prostate: Secondary | ICD-10-CM | POA: Insufficient documentation

## 2018-05-06 LAB — CBC
HCT: 38.5 % — ABNORMAL LOW (ref 40.0–52.0)
Hemoglobin: 13 g/dL (ref 13.0–18.0)
MCH: 31.8 pg (ref 26.0–34.0)
MCHC: 33.9 g/dL (ref 32.0–36.0)
MCV: 94 fL (ref 80.0–100.0)
Platelets: 180 10*3/uL (ref 150–440)
RBC: 4.09 MIL/uL — ABNORMAL LOW (ref 4.40–5.90)
RDW: 14.2 % (ref 11.5–14.5)
WBC: 8.3 10*3/uL (ref 3.8–10.6)

## 2018-05-07 ENCOUNTER — Ambulatory Visit
Admission: RE | Admit: 2018-05-07 | Discharge: 2018-05-07 | Disposition: A | Discharge status: Home / Self Care | Payer: Medicare Other | Source: Ambulatory Visit | Attending: Radiation Oncology | Admitting: Radiation Oncology

## 2018-05-07 DIAGNOSIS — C61 Malignant neoplasm of prostate: Secondary | ICD-10-CM | POA: Diagnosis not present

## 2018-05-07 DIAGNOSIS — Z510 Encounter for antineoplastic radiation therapy: Secondary | ICD-10-CM | POA: Diagnosis not present

## 2018-05-10 ENCOUNTER — Ambulatory Visit
Admission: RE | Admit: 2018-05-10 | Discharge: 2018-05-10 | Disposition: A | Discharge status: Home / Self Care | Payer: Medicare Other | Source: Ambulatory Visit | Attending: Radiation Oncology | Admitting: Radiation Oncology

## 2018-05-10 DIAGNOSIS — Z510 Encounter for antineoplastic radiation therapy: Secondary | ICD-10-CM | POA: Diagnosis not present

## 2018-05-10 DIAGNOSIS — C61 Malignant neoplasm of prostate: Secondary | ICD-10-CM | POA: Diagnosis not present

## 2018-05-11 ENCOUNTER — Ambulatory Visit
Admission: RE | Admit: 2018-05-11 | Discharge: 2018-05-11 | Disposition: A | Discharge status: Home / Self Care | Payer: Medicare Other | Source: Ambulatory Visit | Attending: Radiation Oncology | Admitting: Radiation Oncology

## 2018-05-11 DIAGNOSIS — C61 Malignant neoplasm of prostate: Secondary | ICD-10-CM | POA: Diagnosis not present

## 2018-05-11 DIAGNOSIS — Z510 Encounter for antineoplastic radiation therapy: Secondary | ICD-10-CM | POA: Diagnosis not present

## 2018-05-12 ENCOUNTER — Other Ambulatory Visit: Payer: Self-pay | Admitting: Family Medicine

## 2018-05-12 ENCOUNTER — Ambulatory Visit
Admission: RE | Admit: 2018-05-12 | Discharge: 2018-05-12 | Disposition: A | Discharge status: Home / Self Care | Payer: Medicare Other | Source: Ambulatory Visit | Attending: Radiation Oncology | Admitting: Radiation Oncology

## 2018-05-12 DIAGNOSIS — E785 Hyperlipidemia, unspecified: Secondary | ICD-10-CM

## 2018-05-12 DIAGNOSIS — Z510 Encounter for antineoplastic radiation therapy: Secondary | ICD-10-CM | POA: Diagnosis not present

## 2018-05-12 DIAGNOSIS — C61 Malignant neoplasm of prostate: Secondary | ICD-10-CM | POA: Diagnosis not present

## 2018-05-13 ENCOUNTER — Ambulatory Visit
Admission: RE | Admit: 2018-05-13 | Discharge: 2018-05-13 | Disposition: A | Discharge status: Home / Self Care | Payer: Medicare Other | Source: Ambulatory Visit | Attending: Radiation Oncology | Admitting: Radiation Oncology

## 2018-05-13 DIAGNOSIS — C61 Malignant neoplasm of prostate: Secondary | ICD-10-CM | POA: Diagnosis not present

## 2018-05-13 DIAGNOSIS — Z510 Encounter for antineoplastic radiation therapy: Secondary | ICD-10-CM | POA: Diagnosis not present

## 2018-05-14 ENCOUNTER — Ambulatory Visit
Admission: RE | Admit: 2018-05-14 | Discharge: 2018-05-14 | Disposition: A | Discharge status: Home / Self Care | Payer: Medicare Other | Source: Ambulatory Visit | Attending: Radiation Oncology | Admitting: Radiation Oncology

## 2018-05-14 DIAGNOSIS — Z510 Encounter for antineoplastic radiation therapy: Secondary | ICD-10-CM | POA: Diagnosis not present

## 2018-05-14 DIAGNOSIS — C61 Malignant neoplasm of prostate: Secondary | ICD-10-CM | POA: Diagnosis not present

## 2018-05-17 ENCOUNTER — Ambulatory Visit: Payer: Medicare Other | Attending: Radiation Oncology

## 2018-05-17 ENCOUNTER — Ambulatory Visit
Admission: RE | Admit: 2018-05-17 | Discharge: 2018-05-17 | Disposition: A | Discharge status: Home / Self Care | Payer: Medicare Other | Source: Ambulatory Visit | Attending: Radiation Oncology | Admitting: Radiation Oncology

## 2018-05-17 ENCOUNTER — Other Ambulatory Visit: Payer: Self-pay

## 2018-05-17 DIAGNOSIS — R262 Difficulty in walking, not elsewhere classified: Secondary | ICD-10-CM | POA: Insufficient documentation

## 2018-05-17 DIAGNOSIS — Z510 Encounter for antineoplastic radiation therapy: Secondary | ICD-10-CM | POA: Diagnosis not present

## 2018-05-17 DIAGNOSIS — R2681 Unsteadiness on feet: Secondary | ICD-10-CM

## 2018-05-17 DIAGNOSIS — R2689 Other abnormalities of gait and mobility: Secondary | ICD-10-CM | POA: Diagnosis not present

## 2018-05-17 DIAGNOSIS — M6281 Muscle weakness (generalized): Secondary | ICD-10-CM | POA: Diagnosis not present

## 2018-05-17 DIAGNOSIS — C61 Malignant neoplasm of prostate: Secondary | ICD-10-CM | POA: Diagnosis not present

## 2018-05-17 NOTE — Therapy (Signed)
[place] MAIN [facility] SERVICES [address] [place], [place], [address] Phone: [phone]   Fax:  [phone]  Physical Therapy Evaluation  Patient Details  Name: [name] MRN: [id number] Date of Birth: [birth year] No data recorded  Encounter Date: 05/17/2018  PT End of Session - 05/18/18 0829    Visit Number  1    Number of Visits  16    Date for PT Re-Evaluation  07/13/18    Authorization Type  1/10 PN start 9/17    PT Start Time  1255    PT Stop Time  1345    PT Time Calculation (min)  50 min    Equipment Utilized During Treatment  Gait belt    Activity Tolerance  Patient tolerated treatment well    Behavior During Therapy  WFL for tasks assessed/performed       Past Medical History:  Diagnosis Date  . Arthritis   . Atherosclerotic heart disease of native coronary artery without angina pectoris 1998   - s/p CABG, occluded SVG-D1; patent LIMA-LAD, SVG-OM, SVG- RCA  . Basal cell carcinoma of eyelid 01/03/2013   2019 - R side of Nose  . Cancer [facility])    Skin cancer  . Colon polyps   . COPD (chronic obstructive pulmonary disease) ([place])   . GERD (gastroesophageal reflux disease)   . Hemorrhoid 02/10/2015  . History of hiatal hernia   . HOH (hard of hearing)   . Hypertension   . Memory loss   . Osteoporosis   . PAF (paroxysmal atrial fibrillation) ([place])   . Prostate disorder   . Sciatic pain    Chronic  . Sleep apnea    cpap  . Temporary cerebral vascular dysfunction 02/10/2015   Had negative work-up.  July, 2012.  No medication changes, had forgot them that day, got dehydrated.     Past Surgical History:  Procedure Laterality Date  . ANKLE SURGERY    . Arch aortogram and carotid aortogram  06/02/2006   Dr Oneida Alar did surgery  . BASAL CELL CARCINOMA EXCISION  04/2016   Dermatology  . CARDIAC CATHETERIZATION  01/30/1998    (Dr. Linard Millers): Native LAD & mRCA CTO. 90% D1. Mod Cx. SVG-D1 CTO. SVG-RCA & SVG-OM along with  LIMA-LAD patent;  LV NORMAL Fxn  . CAROTID ENDARTERECTOMY Left Oct. 29, 2007   Dr. Oneida Alar:  . CATARACT EXTRACTION W/PHACO Right 03/05/2017   Procedure: CATARACT EXTRACTION PHACO AND INTRAOCULAR LENS PLACEMENT (IOC);  Surgeon: Leandrew Koyanagi, MD;  Location: [facility] ORS;  Service: Ophthalmology;  Laterality: Right;  [place] 00:58.5 AP% 10.6 CDE 6.20 Fluid Pack lot # [id number]  . CATARACT EXTRACTION W/PHACO Left 04/15/2017   Procedure: CATARACT EXTRACTION PHACO AND INTRAOCULAR LENS PLACEMENT ([place])  Left;  Surgeon: Leandrew Koyanagi, MD;  Location: [facility];  Service: Ophthalmology;  Laterality: Left;  . COLONOSCOPY WITH PROPOFOL N/A 09/15/2017   Procedure: COLONOSCOPY WITH PROPOFOL;  Surgeon: Lucilla Lame, MD;  Location: [facility] ENDOSCOPY;  Service: Endoscopy;  Laterality: N/A;  . CORONARY ARTERY BYPASS GRAFT  1998   LIMA-LAD, SVG-OM, SVG-RPDA, SVG-DIAG  . CPET / MET  12/2012   Mild chronotropic incompetence - read 82% of predicted; also reduced effort; peak VO2 15.7 / 75% (did not reach Max effort) -- suggested ischemic response in last 1.5 minutes of exercise. Normal pulmonary function on PFTs but poor response to  . DOPPLER ECHOCARDIOGRAPHY  11/20/2010   EF =>55%; LV norm mild aortic scelorosis  . ESOPHAGOGASTRODUODENOSCOPY (  EGD) WITH PROPOFOL N/A 09/15/2017   Procedure: ESOPHAGOGASTRODUODENOSCOPY (EGD) WITH PROPOFOL;  Surgeon: Lucilla Lame, MD;  Location: [facility] ENDOSCOPY;  Service: Endoscopy;  Laterality: N/A;  . FRACTURE SURGERY Right 03/19/2015   wrist  . HIATAL HERNIA REPAIR    . ILIAC ARTERY STENT  12/15/2005   PTA and direct stenting rgt and lft common iliac arteries  . NM GATED MYOVIEW ([place] HX)  12/2017   Low Risk - no ischemia or infarct.  EF > 65%. no RWMA  . NM MYOCAR PERF WALL MOTION  11/11/2010; February 2016   a) TM STRESS----NORMAL PERFUSION ,EF 68%; b) EF 50-55%. Normal LV function. No significant ischemia or infarction.  . ORIF ELBOW FRACTURE Right 01/01/2017   Procedure:  OPEN REDUCTION INTERNAL FIXATION (ORIF) ELBOW/OLECRANON FRACTURE;  Surgeon: Hessie Knows, MD;  Location: [facility] ORS;  Service: Orthopedics;  Laterality: Right;  . ORIF WRIST FRACTURE Right 03/19/2015   Procedure: OPEN REDUCTION INTERNAL FIXATION (ORIF) WRIST FRACTURE;  Surgeon: Hessie Knows, MD;  Location: [facility] ORS;  Service: Orthopedics;  Laterality: Right;  . THORACIC AORTA - CAROTID ANGIOGRAM  October 2007   Dr. 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 TRANSTHORACIC ECHOCARDIOGRAM  February 2016   [facility]: Normal LV function. Dilated left atrium.    There were no vitals filed for this visit.   Subjective Assessment - 05/17/18 1307    Subjective  Patient is a pleasant 84 year old male who presents to physical therapy for weakening/imbalance secondary to radiation therapy for prostate cancer.     Patient is accompained by:  Family member    Pertinent History  Patient goes 5x/week for radiation therapy for prostate cancer. Is getting weaker and no longer goes to the senior center. Just started using cane, when walks a lot his low back and legs ache. He has a significant CV history including quadruple bypass, CAD w cardiac stents, on Eliquis, left carotid endarterectomy, AAA. He is functionally independent but lives alone, his partner lives 5 minutes down the road and seems to assist him with daily living. Activity as tolerated per patient report. Has history of memory deficits.     Limitations  Walking;Standing;House hold activities;Other (comment);Lifting   stability   How long can you sit comfortably?  n/a    How long can you stand comfortably?  needs to hold onto cane.     How long can you walk comfortably?  40 ft     Patient Stated Goals  improve stability    Currently in Pain?  No/denies          [facility] PT Assessment - 05/18/18 0001      Assessment   Medical Diagnosis  balance/weakness    Onset  Date/Surgical Date  --   2 months   Hand Dominance  Right    Next MD Visit  --   5x/week radiation   Prior Therapy  home health for broken arm/wrist      Precautions   Precautions  None;Other (comment)   radiation tx    Required Braces or Orthoses  Other Brace/Splint   R boot/shoe for big toe swelling     Restrictions   Weight Bearing Restrictions  No      Balance Screen   Has the patient fallen in the past 6 months  Yes    How many times?  1    Has the patient had a decrease in activity level because  of a fear of falling?   Yes    Is the patient reluctant to leave their home because of a fear of falling?   Yes      [place]  Private residence    Living Arrangements  Alone    Available Help at Discharge  --   significant other   Type of [place] entrance   7-8 steps in back   [place] - single point;Grab bars - tub/shower;Grab bars - toilet      Prior Function   Level of Independence  Independent with basic ADLs    Vocation  Retired    Leisure  like to go to [employer], hang out with significant other 5 min down the road, go on trips      Cognition   Overall Cognitive Status  Impaired/Different from baseline    Area of Impairment  Memory      Standardized Balance Assessment   Standardized Balance Assessment  Chief Technology Officer Test   Sit to Stand  Able to stand without using hands and stabilize independently    Standing Unsupported  Able to stand safely 2 minutes    Sitting with Back Unsupported but Feet Supported on Floor or Stool  Able to sit safely and securely 2 minutes    Stand to Sit  Sits safely with minimal use of hands    Transfers  Able to transfer safely, definite need of hands    Standing Unsupported with Eyes Closed  Able to stand 10 seconds with supervision    Standing Ubsupported with Feet Together  Able to place feet together independently and  stand for 1 minute with supervision    From Standing, Reach Forward with Outstretched Arm  Can reach forward >5 cm safely (2")    From Standing Position, Pick up Object from [place] to pick up shoe, needs supervision    From Standing Position, Turn to Look Behind Over each Shoulder  Looks behind from both sides and weight shifts well    Turn 360 Degrees  Able to turn 360 degrees safely but slowly    Standing Unsupported, Alternately Place Feet on Step/Stool  Able to complete >2 steps/needs minimal assist    Standing Unsupported, One Foot in Front  Needs help to step but can hold 15 seconds    Standing on One Leg  Unable to try or needs assist to prevent fall    Total Score  38       PAIN: Current: 0/10 Worst pain: 5/10 with movement in BLE and low back  POSTURE: Seated: Forward head, rounded shoulders, Standing: weight shift over LLE (decreased WB on RLE)  PROM/AROM: Tissue extensibility : limited hip flexor length : decreased step length                                  Limited hamstring length:   STRENGTH:  Graded on a 0-5 scale Muscle Group Left Right  Hip Flex 3/5 3+/5  Hip Abd 2+/5 3-/5  Hip Add 2/5 2+/5  Hip Ext    Hip IR/ER    Knee Flex 4-/5 4-/5   Knee Ext 4/5 4/5  Ankle DF 4-/5 4-/5  Ankle PF 4-/5  4-/5   SENSATION:  Decreased L  ankle : hx of improper repair of broken ankle  SPECIAL TESTS: Coordination limited with L: ROM/strength deficits   FUNCTIONAL MOBILITY: STS: without UE support; good weight shift anteriorly without LOB   BALANCE: Dynamic Sitting Balance  Normal Able to sit unsupported and weight shift across midline maximally   Good Able to sit unsupported and weight shift across midline moderately   Good-/Fair+ Able to sit unsupported and weight shift across midline minimally x  Fair Minimal weight shifting ipsilateral/front, difficulty crossing midline   Fair- Reach to ipsilateral side and unable to weight shift   Poor + Able to sit  unsupported with min A and reach to ipsilateral side, unable to weight shift   Poor Able to sit unsupported with mod A and reach ipsilateral/front-can't cross midline    Standing Dynamic Balance  Normal Stand independently unsupported, able to weight shift and cross midline maximally   Good Stand independently unsupported, able to weight shift and cross midline moderately   Good-/Fair+ Stand independently unsupported, able to weight shift across midline minimally   Fair Stand independently unsupported, weight shift, and reach ipsilaterally, loss of balance when crossing midline x  Poor+ Able to stand with Min A and reach ipsilaterally, unable to weight shift   Poor Able to stand with Mod A and minimally reach ipsilaterally, unable to cross midline.    Static Sitting Balance  Normal Able to maintain balance against maximal resistance   Good Able to maintain balance against moderate resistance   Good-/Fair+ Accepts minimal resistance x  Fair Able to sit unsupported without balance loss and without UE support   Poor+ Able to maintain with Minimal assistance from individual or chair   Poor Unable to maintain balance-requires mod/max support from individual or chair    Static Standing Balance  Normal Able to maintain standing balance against maximal resistance   Good Able to maintain standing balance against moderate resistance   Good-/Fair+ Able to maintain standing balance against minimal resistance   Fair Able to stand unsupported without UE support and without LOB for 1-2 min x  Fair- Requires Min A and UE support to maintain standing without loss of balance   Poor+ Requires mod A and UE support to maintain standing without loss of balance   Poor Requires max A and UE support to maintain standing balance without loss      GAIT:  Patient ambulated with SPC and CGA. Decreased step length bilaterally with decreased weight bearing upon RLE. Decreased hip flexion and clearance of bilateral  LE's with limited ankle df. Narrow base of support with functional gait speed.  OUTCOME MEASURES: TEST Outcome Interpretation  5 times sit<>stand 16 sec >60 yo, >15 sec indicates increased risk for falls  10 meter walk test       9 seconds with cane  =1.11        m/s <1.0 m/s indicates increased risk for falls; limited community ambulator  LEFS 68.13% Limited with activities      Berg Balance Assessment 38/56 <36/56 (100% risk for falls), 37-45 (80% risk for falls); 46-51 (>50% risk for falls); 52-55 (lower risk <25% of falls)             Access Code: [email or web address]  URL: [email or web address]  Date: 05/17/2018  Prepared by: Janna Arch   Exercises  Standing March with Counter Support - 10 reps - 1 sets - 5 hold - 1x daily - 7x weekly  Standing Hip Abduction with Counter Support - 10  reps - 1 sets - 5 hold - 1x daily - 7x weekly  Seated Hip Adduction Isometrics with Ball - 10 reps - 1 sets - 5 hold - 1x daily - 7x weekly         Objective measurements completed on examination: See above findings.              PT Education - 05/18/18 0829    Education Details  POC, goals, HEP     Person(s) Educated  Patient;Other (comment)   significant other    Methods  Explanation;Demonstration;Verbal cues    Comprehension  Verbalized understanding;Returned demonstration       PT Short Term Goals - 05/18/18 0837      PT SHORT TERM GOAL #1   Title  Patient will be independent in home exercise program to improve strength/mobility for better functional independence with ADLs.    Baseline  9/17: HEP given     Time  2    Period  Weeks    Status  New    Target Date  06/01/18      PT SHORT TERM GOAL #2   Title  Patient will deny any falls over past 2 weeks to demonstrate improved safety awareness at home and work.     Baseline  9/17: progressive weakness    Time  2    Period  Weeks    Status  New    Target Date  06/01/18      PT SHORT TERM GOAL #3   Title   Patient will increase BLE gross strength to 4-/5 as to improve functional strength for independent gait, increased standing tolerance and increased ADL ability.    Baseline  9/17: Hip flex: L 3/5 R 3+/5 abd: 2+/5, 3-/5; add 2/5 2+/5    Time  2    Period  Weeks    Status  New    Target Date  06/01/18        PT Long Term Goals - 05/18/18 0840      PT LONG TERM GOAL #1   Title  Patient will increase BLE gross strength to 4+/5 as to improve functional strength for independent gait, increased standing tolerance and increased ADL ability.    Baseline  9/17: Hip flex: L 3/5 R 3+/5 abd: 2+/5, 3-/5; add 2/5 2+/5    Time  8    Period  Weeks    Status  New    Target Date  07/13/18      PT LONG TERM GOAL #2   Title  Patient will increase ABC scale score >80% to demonstrate better functional mobility and better confidence with ADLs.     Baseline  9/17: 68.13%    Time  8    Period  Weeks    Status  New    Target Date  07/13/18      PT LONG TERM GOAL #3   Title  Patient will increase Berg Balance score by > 6 points (44/56)  to demonstrate decreased fall risk during functional activities.    Baseline  9/17: 38/56    Time  8    Period  Weeks    Status  New    Target Date  07/13/18      PT LONG TERM GOAL #4   Title  Patient will increase six minute walk test distance to >1000 for progression to community ambulator and improve gait ability    Baseline  perform next session  Time  8    Period  Weeks    Status  New    Target Date  07/13/18             Plan - 05/18/18 0831    Clinical Impression Statement  Patient is a pleasant 84 year old male who presents to physical therapy for weakening/imbalance secondary to radiation therapy for prostate cancer.  Patient's demonstrates limited capacity for functional mobility potentially due to limited strength and stability in standing position. 10 MWT performed in functional range, however gait mechanics are limited by stability. BERG was  scored 38/56 with patient demonstrating limited ability to perform single limb stance tasks. LEFS 68.13%, Patient will benefit from skilled physical therapy to improve strength, stability, and capacity for functional mobility to decrease fall risk and improve quality of life.     History and Personal Factors relevant to plan of care:  This patient presents with 3, personal factors/ comorbidities, and  4  body elements including body structures and functions, activity limitations and or participation restrictions. Patient's condition is  unstable    Clinical Presentation  Unstable    Clinical Presentation due to:  currently undergoing radiation for prostate cancer    Clinical Decision Making  High    Rehab Potential  Fair    Clinical Impairments Affecting Rehab Potential  (+) good support system, high prior level of function (-) currently undergoing radiation therapy for prostate cancer, memory deficits     PT Frequency  2x / week    PT Duration  8 weeks    PT Treatment/Interventions  ADLs/Self Care Home Management;Aquatic Therapy;Cryotherapy;Moist Heat;Traction;Ultrasound;Therapeutic activities;Functional mobility training;Stair training;Gait training;DME Instruction;Therapeutic exercise;Balance training;Neuromuscular re-education;Patient/family education;Manual techniques;Passive range of motion;Energy conservation;Vestibular;Taping    PT Next Visit Plan  look at toe, 6 min walk test, review HEP     PT Home Exercise Plan  see above    Recommended Other Services  n/a    Consulted and Agree with Plan of Care  Patient;Family member/caregiver    Family Member Consulted  significant other       Patient will benefit from skilled therapeutic intervention in order to improve the following deficits and impairments:  Abnormal gait, Cardiopulmonary status limiting activity, Decreased activity tolerance, Decreased balance, Decreased knowledge of precautions, Decreased endurance, Decreased coordination,  Decreased cognition, Decreased knowledge of use of DME, Decreased mobility, Difficulty walking, Decreased safety awareness, Decreased strength, Impaired flexibility, Impaired perceived functional ability, Impaired sensation, Postural dysfunction, Improper body mechanics, Pain  Visit Diagnosis: Unsteadiness on feet  Muscle weakness (generalized)  Other abnormalities of gait and mobility     Problem List Patient Active Problem List   Diagnosis Date Noted  . Prostate cancer ([place]) 02/25/2018  . Blood in stool   . Abnormal feces   . Stricture and stenosis of esophagus   . Benign neoplasm of ascending colon   . Benign neoplasm of descending colon   . Senile purpura ([place]) 04/29/2017  . Anemia 04/29/2017  . Frequent falls 04/29/2017  . Benign localized hyperplasia of prostate with urinary obstruction 07/15/2016  . Elevated PSA 07/15/2016  . Incomplete emptying of bladder 07/15/2016  . Urge incontinence 07/15/2016  . Hypothyroidism 04/25/2016  . Macular degeneration 04/25/2016  . Erectile dysfunction due to arterial insufficiency   . Ischemic heart disease with chronotropic incompetence   . CN (constipation) 02/10/2015  . DD (diverticular disease) 02/10/2015  . Accumulation of fluid in tissues 02/10/2015  . Elevated WBC count 02/10/2015  . Fatigue 02/10/2015  .  Hypoxia 02/10/2015  . Lichen planus 40/06/2724  . Restless leg 02/10/2015  . 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 B12 deficiency 02/10/2015  . Lower extremity edema 12/14/2014  . COPD (chronic obstructive pulmonary disease) ([place]) 11/14/2014  . Post Inflammatory Lung Changes 11/14/2014  . Confusion state 11/08/2014  . Amnesia 11/08/2014  . PAF (paroxysmal atrial fibrillation) ([facility]) CHA2DS2-VASc = 4. AC = Eliquis   . Insomnia 12/09/2013  . OSA on CPAP   . Erectile dysfunction   . Dyspnea on exertion - -essentially resolved with BB dose reduction & wgt loss 03/29/2013  . Overweight (BMI 25.0-29.9) -- Wgt back up  01/17/2013  . Atherosclerotic heart disease of native coronary artery without angina pectoris - s/p CABG, occluded SVG-D1; patent LIMA-LAD, SVG-OM, SVG- RCA   . Essential hypertension   . Hyperlipidemia with target LDL less than 70   . Aorto-iliac disease ([place])   . BCC (basal cell carcinoma), eyelid 01/03/2013  . Allergic rhinitis 08/17/2009  . Arthralgia of ankle or foot 06/04/2009  . Adaptation reaction 05/28/2009  . Benign prostatic hypertrophy without urinary obstruction 05/28/2009  . Acid reflux 05/28/2009  . Arthritis, degenerative 05/28/2009  . 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 Carotid artery occlusion without infarction 06/01/2006  . PAD (peripheral artery disease) - bilateral common iliac stents 12/15/2005  . Aortoiliac occlusive disease ([place]) 11/30/2005  . Atherosclerotic heart disease of artery bypass graft 09/01/1997  Janna Arch, PT, DPT   05/18/2018, 8:45 AM  [place] MAIN [facility] SERVICES [address] [place], [place], [address] Phone: [phone]   Fax:  [phone]  Name: [name] MRN: [id number] Date of Birth: [birth year]

## 2018-05-18 ENCOUNTER — Ambulatory Visit
Admission: RE | Admit: 2018-05-18 | Discharge: 2018-05-18 | Disposition: A | Discharge status: Home / Self Care | Payer: Medicare Other | Source: Ambulatory Visit | Attending: Radiation Oncology | Admitting: Radiation Oncology

## 2018-05-18 DIAGNOSIS — C61 Malignant neoplasm of prostate: Secondary | ICD-10-CM | POA: Diagnosis not present

## 2018-05-18 DIAGNOSIS — Z510 Encounter for antineoplastic radiation therapy: Secondary | ICD-10-CM | POA: Diagnosis not present

## 2018-05-18 NOTE — Patient Instructions (Signed)
Access Code: [email or web address]  URL: [email or web address]  Date: 05/17/2018  Prepared by: Janna Arch   Exercises  Standing March with Counter Support - 10 reps - 1 sets - 5 hold - 1x daily - 7x weekly  Standing Hip Abduction with Counter Support - 10 reps - 1 sets - 5 hold - 1x daily - 7x weekly  Seated Hip Adduction Isometrics with Ball - 10 reps - 1 sets - 5 hold - 1x daily - 7x weekly

## 2018-05-19 ENCOUNTER — Ambulatory Visit
Admission: RE | Admit: 2018-05-19 | Discharge: 2018-05-19 | Disposition: A | Discharge status: Home / Self Care | Payer: Medicare Other | Source: Ambulatory Visit | Attending: Radiation Oncology | Admitting: Radiation Oncology

## 2018-05-19 ENCOUNTER — Ambulatory Visit: Payer: Medicare Other

## 2018-05-19 DIAGNOSIS — M6281 Muscle weakness (generalized): Secondary | ICD-10-CM

## 2018-05-19 DIAGNOSIS — C61 Malignant neoplasm of prostate: Secondary | ICD-10-CM | POA: Diagnosis not present

## 2018-05-19 DIAGNOSIS — R2689 Other abnormalities of gait and mobility: Secondary | ICD-10-CM | POA: Diagnosis not present

## 2018-05-19 DIAGNOSIS — Z510 Encounter for antineoplastic radiation therapy: Secondary | ICD-10-CM | POA: Diagnosis not present

## 2018-05-19 DIAGNOSIS — R262 Difficulty in walking, not elsewhere classified: Secondary | ICD-10-CM | POA: Diagnosis not present

## 2018-05-19 DIAGNOSIS — R2681 Unsteadiness on feet: Secondary | ICD-10-CM | POA: Diagnosis not present

## 2018-05-19 NOTE — Therapy (Addendum)
[place] MAIN [facility] SERVICES [address] [place], [place], [address] Phone: [phone]   Fax:  [phone]  Physical Therapy Treatment  Patient Details  Name: [name] MRN: [id number] Date of Birth: 1933-11-29 No data recorded  Encounter Date: 05/19/2018  PT End of Session - 05/19/18 1359    Visit Number  2    Number of Visits  16    Date for PT Re-Evaluation  07/13/18    Authorization Type  2/10 PN start 9/17    PT Start Time  1258    PT Stop Time  1344    PT Time Calculation (min)  46 min    Equipment Utilized During Treatment  Gait belt    Activity Tolerance  Patient tolerated treatment well    Behavior During Therapy  [facility] for tasks assessed/performed       Past Medical History:  Diagnosis Date  . Arthritis   . Atherosclerotic heart disease of native coronary artery without angina pectoris 1998   - s/p CABG, occluded SVG-D1; patent LIMA-LAD, SVG-OM, SVG- RCA  . Basal cell carcinoma of eyelid 01/03/2013   2019 - R side of Nose  . Cancer [facility])    Skin cancer  . Colon polyps   . COPD (chronic obstructive pulmonary disease) ([place])   . GERD (gastroesophageal reflux disease)   . Hemorrhoid 02/10/2015  . History of hiatal hernia   . HOH (hard of hearing)   . Hypertension   . Memory loss   . Osteoporosis   . PAF (paroxysmal atrial fibrillation) ([place])   . Prostate disorder   . Sciatic pain    Chronic  . Sleep apnea    cpap  . Temporary cerebral vascular dysfunction 02/10/2015   Had negative work-up.  July, 2012.  No medication changes, had forgot them that day, got dehydrated.     Past Surgical History:  Procedure Laterality Date  . ANKLE SURGERY    . Arch aortogram and carotid aortogram  06/02/2006   Dr Oneida Alar did surgery  . BASAL CELL CARCINOMA EXCISION  04/2016   Dermatology  . CARDIAC CATHETERIZATION  01/30/1998    (Dr. Linard Millers): Native LAD & mRCA CTO. 90% D1. Mod Cx. SVG-D1 CTO. SVG-RCA & SVG-OM along with  LIMA-LAD patent;  LV NORMAL Fxn  . CAROTID ENDARTERECTOMY Left Oct. 29, 2007   Dr. Oneida Alar:  . CATARACT EXTRACTION W/PHACO Right 03/05/2017   Procedure: CATARACT EXTRACTION PHACO AND INTRAOCULAR LENS PLACEMENT (IOC);  Surgeon: Leandrew Koyanagi, MD;  Location: [facility] ORS;  Service: Ophthalmology;  Laterality: Right;  [place] 00:58.5 AP% 10.6 CDE 6.20 Fluid Pack lot # [id number]  . CATARACT EXTRACTION W/PHACO Left 04/15/2017   Procedure: CATARACT EXTRACTION PHACO AND INTRAOCULAR LENS PLACEMENT ([place])  Left;  Surgeon: Leandrew Koyanagi, MD;  Location: [place];  Service: Ophthalmology;  Laterality: Left;  . COLONOSCOPY WITH PROPOFOL N/A 09/15/2017   Procedure: COLONOSCOPY WITH PROPOFOL;  Surgeon: Lucilla Lame, MD;  Location: [facility] ENDOSCOPY;  Service: Endoscopy;  Laterality: N/A;  . CORONARY ARTERY BYPASS GRAFT  1998   LIMA-LAD, SVG-OM, SVG-RPDA, SVG-DIAG  . CPET / MET  12/2012   Mild chronotropic incompetence - read 82% of predicted; also reduced effort; peak VO2 15.7 / 75% (did not reach Max effort) -- suggested ischemic response in last 1.5 minutes of exercise. Normal pulmonary function on PFTs but poor response to  . DOPPLER ECHOCARDIOGRAPHY  11/20/2010   EF =>55%; LV norm mild aortic scelorosis  . ESOPHAGOGASTRODUODENOSCOPY (  EGD) WITH PROPOFOL N/A 09/15/2017   Procedure: ESOPHAGOGASTRODUODENOSCOPY (EGD) WITH PROPOFOL;  Surgeon: Lucilla Lame, MD;  Location: [facility] ENDOSCOPY;  Service: Endoscopy;  Laterality: N/A;  . FRACTURE SURGERY Right 03/19/2015   wrist  . HIATAL HERNIA REPAIR    . ILIAC ARTERY STENT  12/15/2005   PTA and direct stenting rgt and lft common iliac arteries  . NM GATED MYOVIEW ([place] [facility])  12/2017   Low Risk - no ischemia or infarct.  EF > 65%. no RWMA  . NM MYOCAR PERF WALL MOTION  11/11/2010; February 2016   a) TM STRESS----NORMAL PERFUSION ,EF 68%; b) EF 50-55%. Normal LV function. No significant ischemia or infarction.  . ORIF ELBOW FRACTURE Right 01/01/2017   Procedure:  OPEN REDUCTION INTERNAL FIXATION (ORIF) ELBOW/OLECRANON FRACTURE;  Surgeon: Hessie Knows, MD;  Location: [facility] ORS;  Service: Orthopedics;  Laterality: Right;  . ORIF WRIST FRACTURE Right 03/19/2015   Procedure: OPEN REDUCTION INTERNAL FIXATION (ORIF) WRIST FRACTURE;  Surgeon: Hessie Knows, MD;  Location: [facility] ORS;  Service: Orthopedics;  Laterality: Right;  . THORACIC AORTA - CAROTID ANGIOGRAM  October 2007   Dr. 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 TRANSTHORACIC ECHOCARDIOGRAM  February 2016   [facility]: Normal LV function. Dilated left atrium.    There were no vitals filed for this visit.  Subjective Assessment - 05/19/18 1257    Subjective  Patient reports he is doing well today. He is no longer in walking boot and reports his toe is doing much better. Patient reports doing his HEP.     Pertinent History  Patient goes 5x/week for radiation therapy for prostate cancer. Is getting weaker and no longer goes to the senior center. Just started using cane, when walks a lot his low back and legs ache. He has a significant CV history including quadruple bypass, CAD w cardiac stents, on Eliquis, left carotid endarterectomy, AAA. He is functionally independent but lives alone, his partner lives 5 minutes down the road and seems to assist him with daily living. Activity as tolerated per patient report. Has history of memory deficits.     Limitations  Walking;Standing;House hold activities;Other (comment);Lifting    How long can you sit comfortably?  n/a    How long can you stand comfortably?  needs to hold onto cane.     How long can you walk comfortably?  40 ft     Patient Stated Goals  improve stability    Currently in Pain?  No/denies        54mt: 13054fwithout cane; R trunk lean with vaulting over R leg,   Seated adduction ball squeezes x10 3 second holds.   Seated abduction with RTB x10 3 second  holds  Seated LAQ with 3 second hold x10 alternating each leg.   Standing marches x10 CGA. Verbal cues to complete slow and controlled movement with increase step height.   Standing hip extension x10 each leg. Verbal cues to keep trunk upright and leg straight.   Standing hip abduction x10 each leg. With bilateral UE support. Verbal cues to keep trunk upright and LE in neutral alignment.   Standing airex pad in //bars 2 x 30 seconds CGA. Mild sway noted   Standing airex pad in //bars  with head turns left/right;up/down x10. Without UE support. CGA  Standing airex pad toe taps with 6in step in //bars. CGA and faded UE support. Verbal cues to widen BOS and  increase step height.   Balloon taps in all directions reaching in/out of BOS. Required 1 UE support for majority of task with occasional decrease in UE. 3 minutes  Step ups unto 4in step in //bars x10. Bilateral UE support and CGA.    Patient required verbal cues for body mechanics and CGA .                    PT Education - 05/19/18 1359    Education provided  Yes    Education Details  HEP, exercise technique     Person(s) Educated  Patient    Methods  Explanation;Demonstration;Verbal cues    Comprehension  Verbalized understanding;Returned demonstration       PT Short Term Goals - 05/18/18 0837      PT SHORT TERM GOAL #1   Title  Patient will be independent in home exercise program to improve strength/mobility for better functional independence with ADLs.    Baseline  9/17: HEP given     Time  2    Period  Weeks    Status  New    Target Date  06/01/18      PT SHORT TERM GOAL #2   Title  Patient will deny any falls over past 2 weeks to demonstrate improved safety awareness at home and work.     Baseline  9/17: progressive weakness    Time  2    Period  Weeks    Status  New    Target Date  06/01/18      PT SHORT TERM GOAL #3   Title  Patient will increase BLE gross strength to 4-/5 as to improve  functional strength for independent gait, increased standing tolerance and increased ADL ability.    Baseline  9/17: Hip flex: L 3/5 R 3+/5 abd: 2+/5, 3-/5; add 2/5 2+/5    Time  2    Period  Weeks    Status  New    Target Date  06/01/18        PT Long Term Goals - 05/19/18 1623      PT LONG TERM GOAL #1   Title  Patient will increase BLE gross strength to 4+/5 as to improve functional strength for independent gait, increased standing tolerance and increased ADL ability.    Baseline  9/17: Hip flex: L 3/5 R 3+/5 abd: 2+/5, 3-/5; add 2/5 2+/5    Time  8    Period  Weeks    Status  New      PT LONG TERM GOAL #2   Title  Patient will increase ABC scale score >80% to demonstrate better functional mobility and better confidence with ADLs.     Baseline  9/17: 68.13%    Time  8    Period  Weeks    Status  New      PT LONG TERM GOAL #3   Title  Patient will increase Berg Balance score by > 6 points (44/56)  to demonstrate decreased fall risk during functional activities.    Baseline  9/17: 38/56    Time  8    Period  Weeks    Status  New      PT LONG TERM GOAL #4   Title  Patient will increase six minute walk test distance to >1000 for progression to community ambulator and improve gait ability    Baseline  9/18: 1300 ft w/o cane    Time  8    Period  Weeks    Status  New            Plan - 05/19/18 1357    Clinical Impression Statement  Patient tolerated session well with successful completion of static and dynamic balance exercises including standing on airex pad and balloon taps. Patient was challenged with toe taps on airex pad needing bilateral UE when beginning exercise due to decrease strength and stability in standing. Patient completed 77mt without cane and observed mild-moderate fatigue following exercise. Patient will continue to benefit from skilled physical therapy services to improve functional mobility and improve quality of life.     Rehab Potential  Fair     Clinical Impairments Affecting Rehab Potential  (+) good support system, high prior level of function (-) currently undergoing radiation therapy for prostate cancer, memory deficits     PT Frequency  2x / week    PT Duration  8 weeks    PT Treatment/Interventions  ADLs/Self Care Home Management;Aquatic Therapy;Cryotherapy;Moist Heat;Traction;Ultrasound;Therapeutic activities;Functional mobility training;Stair training;Gait training;DME Instruction;Therapeutic exercise;Balance training;Neuromuscular re-education;Patient/family education;Manual techniques;Passive range of motion;Energy conservation;Vestibular;Taping    PT Next Visit Plan  look at toe, 6 min walk test, review HEP     PT Home Exercise Plan  see above    Consulted and Agree with Plan of Care  Patient       Patient will benefit from skilled therapeutic intervention in order to improve the following deficits and impairments:  Abnormal gait, Cardiopulmonary status limiting activity, Decreased activity tolerance, Decreased balance, Decreased knowledge of precautions, Decreased endurance, Decreased coordination, Decreased cognition, Decreased knowledge of use of DME, Decreased mobility, Difficulty walking, Decreased safety awareness, Decreased strength, Impaired flexibility, Impaired perceived functional ability, Impaired sensation, Postural dysfunction, Improper body mechanics, Pain  Visit Diagnosis: Unsteadiness on feet  Muscle weakness (generalized)  Other abnormalities of gait and mobility     Problem List Patient Active Problem List   Diagnosis Date Noted  . Prostate cancer ([place] 02/25/2018  . Blood in stool   . Abnormal feces   . Stricture and stenosis of esophagus   . Benign neoplasm of ascending colon   . Benign neoplasm of descending colon   . Senile purpura ([place] 04/29/2017  . Anemia 04/29/2017  . Frequent falls 04/29/2017  . Benign localized hyperplasia of prostate with urinary obstruction 07/15/2016  . Elevated  PSA 07/15/2016  . Incomplete emptying of bladder 07/15/2016  . Urge incontinence 07/15/2016  . Hypothyroidism 04/25/2016  . Macular degeneration 04/25/2016  . Erectile dysfunction due to arterial insufficiency   . Ischemic heart disease with chronotropic incompetence   . CN (constipation) 02/10/2015  . DD (diverticular disease) 02/10/2015  . Accumulation of fluid in tissues 02/10/2015  . Elevated WBC count 02/10/2015  . Fatigue 02/10/2015  . Hypoxia 02/10/2015  . Lichen planus 028/78/6767 . Restless leg 02/10/2015  . 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 B12 deficiency 02/10/2015  . Lower extremity edema 12/14/2014  . COPD (chronic obstructive pulmonary disease) ([place] 11/14/2014  . Post Inflammatory Lung Changes 11/14/2014  . Confusion state 11/08/2014  . Amnesia 11/08/2014  . PAF (paroxysmal atrial fibrillation) ([facility]) CHA2DS2-VASc = 4. AC = Eliquis   . Insomnia 12/09/2013  . OSA on CPAP   . Erectile dysfunction   . Dyspnea on exertion - -essentially resolved with BB dose reduction & wgt loss 03/29/2013  . Overweight (BMI 25.0-29.9) -- Wgt back up 01/17/2013  . Atherosclerotic heart disease of native coronary artery without angina pectoris - s/p CABG, occluded SVG-D1; patent  LIMA-LAD, SVG-OM, SVG- RCA   . Essential hypertension   . Hyperlipidemia with target LDL less than 70   . Aorto-iliac disease ([place])   . BCC (basal cell carcinoma), eyelid 01/03/2013  . Allergic rhinitis 08/17/2009  . Arthralgia of ankle or foot 06/04/2009  . Adaptation reaction 05/28/2009  . Benign prostatic hypertrophy without urinary obstruction 05/28/2009  . Acid reflux 05/28/2009  . Arthritis, degenerative 05/28/2009  . 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 Carotid artery occlusion without infarction 06/01/2006  . PAD (peripheral artery disease) - bilateral common iliac stents 12/15/2005  . Aortoiliac occlusive disease ([place]) 11/30/2005  . Atherosclerotic heart disease of artery bypass  graft 09/01/1997   Erick Blinks, SPT  This entire session was performed under direct supervision and direction of a licensed therapist/therapist assistant . I have personally read, edited and approve of the note as written.  Janna Arch, PT, DPT   05/19/2018, 4:26 PM  [place] MAIN [facility] SERVICES [address] [place], [place], [address] Phone: [phone]   Fax:  [phone]  Name: [name] MRN: [id number] Date of Birth: Jun 02, 1934

## 2018-05-20 ENCOUNTER — Ambulatory Visit
Admission: RE | Admit: 2018-05-20 | Discharge: 2018-05-20 | Disposition: A | Discharge status: Home / Self Care | Payer: Medicare Other | Source: Ambulatory Visit | Attending: Radiation Oncology | Admitting: Radiation Oncology

## 2018-05-20 ENCOUNTER — Inpatient Hospital Stay: Payer: Medicare Other

## 2018-05-20 DIAGNOSIS — C61 Malignant neoplasm of prostate: Secondary | ICD-10-CM | POA: Diagnosis not present

## 2018-05-20 DIAGNOSIS — Z510 Encounter for antineoplastic radiation therapy: Secondary | ICD-10-CM | POA: Diagnosis not present

## 2018-05-20 LAB — CBC
HCT: 40.5 % (ref 40.0–52.0)
Hemoglobin: 13.8 g/dL (ref 13.0–18.0)
MCH: 32 pg (ref 26.0–34.0)
MCHC: 34.1 g/dL (ref 32.0–36.0)
MCV: 93.7 fL (ref 80.0–100.0)
Platelets: 135 10*3/uL — ABNORMAL LOW (ref 150–440)
RBC: 4.32 MIL/uL — ABNORMAL LOW (ref 4.40–5.90)
RDW: 15.2 % — ABNORMAL HIGH (ref 11.5–14.5)
WBC: 9.9 10*3/uL (ref 3.8–10.6)

## 2018-05-21 ENCOUNTER — Ambulatory Visit
Admission: RE | Admit: 2018-05-21 | Discharge: 2018-05-21 | Disposition: A | Discharge status: Home / Self Care | Payer: Medicare Other | Source: Ambulatory Visit | Attending: Radiation Oncology | Admitting: Radiation Oncology

## 2018-05-21 DIAGNOSIS — C61 Malignant neoplasm of prostate: Secondary | ICD-10-CM | POA: Diagnosis not present

## 2018-05-21 DIAGNOSIS — Z510 Encounter for antineoplastic radiation therapy: Secondary | ICD-10-CM | POA: Diagnosis not present

## 2018-05-24 ENCOUNTER — Ambulatory Visit: Payer: Medicare Other

## 2018-05-24 ENCOUNTER — Ambulatory Visit
Admission: RE | Admit: 2018-05-24 | Discharge: 2018-05-24 | Disposition: A | Discharge status: Home / Self Care | Payer: Medicare Other | Source: Ambulatory Visit | Attending: Radiation Oncology | Admitting: Radiation Oncology

## 2018-05-24 DIAGNOSIS — R2681 Unsteadiness on feet: Secondary | ICD-10-CM

## 2018-05-24 DIAGNOSIS — R262 Difficulty in walking, not elsewhere classified: Secondary | ICD-10-CM | POA: Diagnosis not present

## 2018-05-24 DIAGNOSIS — R2689 Other abnormalities of gait and mobility: Secondary | ICD-10-CM

## 2018-05-24 DIAGNOSIS — M6281 Muscle weakness (generalized): Secondary | ICD-10-CM

## 2018-05-24 DIAGNOSIS — C61 Malignant neoplasm of prostate: Secondary | ICD-10-CM | POA: Diagnosis not present

## 2018-05-24 DIAGNOSIS — Z510 Encounter for antineoplastic radiation therapy: Secondary | ICD-10-CM | POA: Diagnosis not present

## 2018-05-24 NOTE — Therapy (Addendum)
[place] MAIN [facility] SERVICES [address] [place], [place], [address] Phone: [phone]   Fax:  [phone]  Physical Therapy Treatment  Patient Details  Name: [name] MRN: [id number] Date of Birth: 06/29/1934 No data recorded  Encounter Date: 05/24/2018  PT End of Session - 05/24/18 1356    Visit Number  3    Number of Visits  16    Date for PT Re-Evaluation  07/13/18    Authorization Type  3/10 PN start 9/17    PT Start Time  1259    PT Stop Time  1345    PT Time Calculation (min)  46 min    Equipment Utilized During Treatment  Gait belt    Activity Tolerance  Patient tolerated treatment well    Behavior During Therapy  WFL for tasks assessed/performed       Past Medical History:  Diagnosis Date  . Arthritis   . Atherosclerotic heart disease of native coronary artery without angina pectoris 1998   - s/p CABG, occluded SVG-D1; patent LIMA-LAD, SVG-OM, SVG- RCA  . Basal cell carcinoma of eyelid 01/03/2013   2019 - R side of Nose  . Cancer [facility])    Skin cancer  . Colon polyps   . COPD (chronic obstructive pulmonary disease) ([place])   . GERD (gastroesophageal reflux disease)   . Hemorrhoid 02/10/2015  . History of hiatal hernia   . HOH (hard of hearing)   . Hypertension   . Memory loss   . Osteoporosis   . PAF (paroxysmal atrial fibrillation) ([place])   . Prostate disorder   . Sciatic pain    Chronic  . Sleep apnea    cpap  . Temporary cerebral vascular dysfunction 02/10/2015   Had negative work-up.  July, 2012.  No medication changes, had forgot them that day, got dehydrated.     Past Surgical History:  Procedure Laterality Date  . ANKLE SURGERY    . Arch aortogram and carotid aortogram  06/02/2006   Dr Oneida Alar did surgery  . BASAL CELL CARCINOMA EXCISION  04/2016   Dermatology  . CARDIAC CATHETERIZATION  01/30/1998    (Dr. Linard Millers): Native LAD & mRCA CTO. 90% D1. Mod Cx. SVG-D1 CTO. SVG-RCA & SVG-OM along with  LIMA-LAD patent;  LV NORMAL Fxn  . CAROTID ENDARTERECTOMY Left Oct. 29, 2007   Dr. Oneida Alar:  . CATARACT EXTRACTION W/PHACO Right 03/05/2017   Procedure: CATARACT EXTRACTION PHACO AND INTRAOCULAR LENS PLACEMENT (IOC);  Surgeon: Leandrew Koyanagi, MD;  Location: [facility] ORS;  Service: Ophthalmology;  Laterality: Right;  [place] 00:58.5 AP% 10.6 CDE 6.20 Fluid Pack lot # [id number]  . CATARACT EXTRACTION W/PHACO Left 04/15/2017   Procedure: CATARACT EXTRACTION PHACO AND INTRAOCULAR LENS PLACEMENT ([place])  Left;  Surgeon: Leandrew Koyanagi, MD;  Location: [place];  Service: Ophthalmology;  Laterality: Left;  . COLONOSCOPY WITH PROPOFOL N/A 09/15/2017   Procedure: COLONOSCOPY WITH PROPOFOL;  Surgeon: Lucilla Lame, MD;  Location: [facility] ENDOSCOPY;  Service: Endoscopy;  Laterality: N/A;  . CORONARY ARTERY BYPASS GRAFT  1998   LIMA-LAD, SVG-OM, SVG-RPDA, SVG-DIAG  . CPET / MET  12/2012   Mild chronotropic incompetence - read 82% of predicted; also reduced effort; peak VO2 15.7 / 75% (did not reach Max effort) -- suggested ischemic response in last 1.5 minutes of exercise. Normal pulmonary function on PFTs but poor response to  . DOPPLER ECHOCARDIOGRAPHY  11/20/2010   EF =>55%; LV norm mild aortic scelorosis  . ESOPHAGOGASTRODUODENOSCOPY (  EGD) WITH PROPOFOL N/A 09/15/2017   Procedure: ESOPHAGOGASTRODUODENOSCOPY (EGD) WITH PROPOFOL;  Surgeon: Lucilla Lame, MD;  Location: [facility] ENDOSCOPY;  Service: Endoscopy;  Laterality: N/A;  . FRACTURE SURGERY Right 03/19/2015   wrist  . HIATAL HERNIA REPAIR    . ILIAC ARTERY STENT  12/15/2005   PTA and direct stenting rgt and lft common iliac arteries  . NM GATED MYOVIEW ([place] [facility])  12/2017   Low Risk - no ischemia or infarct.  EF > 65%. no RWMA  . NM MYOCAR PERF WALL MOTION  11/11/2010; February 2016   a) TM STRESS----NORMAL PERFUSION ,EF 68%; b) EF 50-55%. Normal LV function. No significant ischemia or infarction.  . ORIF ELBOW FRACTURE Right 01/01/2017   Procedure:  OPEN REDUCTION INTERNAL FIXATION (ORIF) ELBOW/OLECRANON FRACTURE;  Surgeon: Hessie Knows, MD;  Location: [facility] ORS;  Service: Orthopedics;  Laterality: Right;  . ORIF WRIST FRACTURE Right 03/19/2015   Procedure: OPEN REDUCTION INTERNAL FIXATION (ORIF) WRIST FRACTURE;  Surgeon: Hessie Knows, MD;  Location: [facility] ORS;  Service: Orthopedics;  Laterality: Right;  . THORACIC AORTA - CAROTID ANGIOGRAM  October 2007   Dr. 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 TRANSTHORACIC ECHOCARDIOGRAM  February 2016   [facility]: Normal LV function. Dilated left atrium.    There were no vitals filed for this visit.  Subjective Assessment - 05/24/18 1302    Subjective  Patient reports he has had back pain that began last Wednesday that lasted until this morning (VAS 5/10). States he thinks he was dehyrdrated and has been drinking more water today and the pain has gone away. Reports not doing his HEP because of the back pain.     Pertinent History  Patient goes 5x/week for radiation therapy for prostate cancer. Is getting weaker and no longer goes to the senior center. Just started using cane, when walks a lot his low back and legs ache. He has a significant CV history including quadruple bypass, CAD w cardiac stents, on Eliquis, left carotid endarterectomy, AAA. He is functionally independent but lives alone, his partner lives 5 minutes down the road and seems to assist him with daily living. Activity as tolerated per patient report. Has history of memory deficits.     Limitations  Walking;Standing;House hold activities;Other (comment);Lifting    How long can you sit comfortably?  n/a    How long can you stand comfortably?  needs to hold onto cane.     How long can you walk comfortably?  40 ft     Patient Stated Goals  improve stability    Currently in Pain?  No/denies      Nustep lvl 3 4 minutes >60rpm for cardiovascular challenge    Seated adduction ball squeezes x10 each leg 3 second holds.    Seated marches  x10 3 second holds    Seated TrA activation x10 with 3 second holds.    Standing marches x10 CGA. Verbal cues to complete slow and controlled movement with increase step height.   Standing step over fwd/bwd with orange hurdle. Bilateral UE support. Verbal cues to bring knee towards the ceiling for foot clearance. CGA  Standing lateral step over with orange hurdle. Bilateral UE support and verbal cues to maintain neutral feet alignment and bring knee towards the ceiling. CGA  STS without UE support x10. Verbal cues to decrease speed and control descending motion. CGA  Side step in //bars with bilateral UE support x6.  Verbal cues to maintain neutral feet alignment. CGA   Tandem in //bars without UE support. 3x30 seconds each leg. Needed occasional use of hands to maintain stability. CGA    Standing airex pad in //bars  with head turns left/right;up/down x10. Without UE support. CGA   Standing airex pad toe taps with 6in step in //bars. CGA and 1 UE support. Verbal cues to widen BOS and increase step height.   Balloon taps in all directions reaching in/out of BOS. Required 1 UE support for majority of task with occasional decrease in UE. 3 minutes     Patient required verbal cues for body mechanics and CGA .                          PT Education - 05/24/18 1252    Education provided  Yes    Education Details  HEP, exercise technique    Person(s) Educated  Patient    Methods  Explanation;Demonstration;Verbal cues    Comprehension  Verbalized understanding;Returned demonstration       PT Short Term Goals - 05/18/18 0837      PT SHORT TERM GOAL #1   Title  Patient will be independent in home exercise program to improve strength/mobility for better functional independence with ADLs.    Baseline  9/17: HEP given     Time  2    Period  Weeks    Status  New    Target Date   06/01/18      PT SHORT TERM GOAL #2   Title  Patient will deny any falls over past 2 weeks to demonstrate improved safety awareness at home and work.     Baseline  9/17: progressive weakness    Time  2    Period  Weeks    Status  New    Target Date  06/01/18      PT SHORT TERM GOAL #3   Title  Patient will increase BLE gross strength to 4-/5 as to improve functional strength for independent gait, increased standing tolerance and increased ADL ability.    Baseline  9/17: Hip flex: L 3/5 R 3+/5 abd: 2+/5, 3-/5; add 2/5 2+/5    Time  2    Period  Weeks    Status  New    Target Date  06/01/18        PT Long Term Goals - 05/19/18 1623      PT LONG TERM GOAL #1   Title  Patient will increase BLE gross strength to 4+/5 as to improve functional strength for independent gait, increased standing tolerance and increased ADL ability.    Baseline  9/17: Hip flex: L 3/5 R 3+/5 abd: 2+/5, 3-/5; add 2/5 2+/5    Time  8    Period  Weeks    Status  New      PT LONG TERM GOAL #2   Title  Patient will increase ABC scale score >80% to demonstrate better functional mobility and better confidence with ADLs.     Baseline  9/17: 68.13%    Time  8    Period  Weeks    Status  New      PT LONG TERM GOAL #3   Title  Patient will increase Berg Balance score by > 6 points (44/56)  to demonstrate decreased fall risk during functional activities.    Baseline  9/17: 38/56    Time  8      Period  Weeks    Status  New      PT LONG TERM GOAL #4   Title  Patient will increase six minute walk test distance to >1000 for progression to community ambulator and improve gait ability    Baseline  9/18: 1300 ft w/o cane    Time  8    Period  Weeks    Status  New            Plan - 05/24/18 1354    Clinical Impression Statement  Patient was challenged with tandem balance with occasional need of UE to maintain stabilization with LE muscle fatigue onset prior to third set. Standing and seated rest breaks were  utilized within session due to muscle fatigue and decreased functional capacity. Patient uses momentum to help rise with sit to stands and has difficulty with controlling eccentric descend while sitting down. Patient will continue to benefit from skilled physical therapy services to improve functional mobility and quality of life.     Rehab Potential  Fair    Clinical Impairments Affecting Rehab Potential  (+) good support system, high prior level of function (-) currently undergoing radiation therapy for prostate cancer, memory deficits     PT Frequency  2x / week    PT Duration  8 weeks    PT Treatment/Interventions  ADLs/Self Care Home Management;Aquatic Therapy;Cryotherapy;Moist Heat;Traction;Ultrasound;Therapeutic activities;Functional mobility training;Stair training;Gait training;DME Instruction;Therapeutic exercise;Balance training;Neuromuscular re-education;Patient/family education;Manual techniques;Passive range of motion;Energy conservation;Vestibular;Taping    PT Next Visit Plan  eccentric step down, sit to stands    PT Home Exercise Plan  see above    Consulted and Agree with Plan of Care  Patient       Patient will benefit from skilled therapeutic intervention in order to improve the following deficits and impairments:  Abnormal gait, Cardiopulmonary status limiting activity, Decreased activity tolerance, Decreased balance, Decreased knowledge of precautions, Decreased endurance, Decreased coordination, Decreased cognition, Decreased knowledge of use of DME, Decreased mobility, Difficulty walking, Decreased safety awareness, Decreased strength, Impaired flexibility, Impaired perceived functional ability, Impaired sensation, Postural dysfunction, Improper body mechanics, Pain  Visit Diagnosis: Unsteadiness on feet  Muscle weakness (generalized)  Other abnormalities of gait and mobility     Problem List Patient Active Problem List   Diagnosis Date Noted  . Prostate cancer (HCC)  02/25/2018  . Blood in stool   . Abnormal feces   . Stricture and stenosis of esophagus   . Benign neoplasm of ascending colon   . Benign neoplasm of descending colon   . Senile purpura (HCC) 04/29/2017  . Anemia 04/29/2017  . Frequent falls 04/29/2017  . Benign localized hyperplasia of prostate with urinary obstruction 07/15/2016  . Elevated PSA 07/15/2016  . Incomplete emptying of bladder 07/15/2016  . Urge incontinence 07/15/2016  . Hypothyroidism 04/25/2016  . Macular degeneration 04/25/2016  . Erectile dysfunction due to arterial insufficiency   . Ischemic heart disease with chronotropic incompetence   . CN (constipation) 02/10/2015  . DD (diverticular disease) 02/10/2015  . Accumulation of fluid in tissues 02/10/2015  . Elevated WBC count 02/10/2015  . Fatigue 02/10/2015  . Hypoxia 02/10/2015  . Lichen planus 02/10/2015  . Restless leg 02/10/2015  . 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 B12 deficiency 02/10/2015  . Lower extremity edema 12/14/2014  . COPD (chronic obstructive pulmonary disease) (HCC) 11/14/2014  . Post Inflammatory Lung Changes 11/14/2014  . Confusion state 11/08/2014  . Amnesia 11/08/2014  . PAF (paroxysmal atrial fibrillation) (HCC) CHA2DS2-VASc = 4.   AC = Eliquis   . Insomnia 12/09/2013  . OSA on CPAP   . Erectile dysfunction   . Dyspnea on exertion - -essentially resolved with BB dose reduction & wgt loss 03/29/2013  . Overweight (BMI 25.0-29.9) -- Wgt back up 01/17/2013  . Atherosclerotic heart disease of native coronary artery without angina pectoris - s/p CABG, occluded SVG-D1; patent LIMA-LAD, SVG-OM, SVG- RCA   . Essential hypertension   . Hyperlipidemia with target LDL less than 70   . Aorto-iliac disease (HCC)   . BCC (basal cell carcinoma), eyelid 01/03/2013  . Allergic rhinitis 08/17/2009  . Arthralgia of ankle or foot 06/04/2009  . Adaptation reaction 05/28/2009  . Benign prostatic hypertrophy without urinary obstruction 05/28/2009   . Acid reflux 05/28/2009  . Arthritis, degenerative 05/28/2009  . 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 Carotid artery occlusion without infarction 06/01/2006  . PAD (peripheral artery disease) - bilateral common iliac stents 12/15/2005  . Aortoiliac occlusive disease (HCC) 11/30/2005  . Atherosclerotic heart disease of artery bypass graft 09/01/1997    , SPT This entire session was performed under direct supervision and direction of a licensed therapist/therapist assistant . I have personally read, edited and approve of the note as written.  Marina Moser, PT, DPT   05/24/2018, 2:20 PM  [facility] SERVICES [address] [place], [place], [address] Phone: [phone]   Fax:  [phone]  Name: [name] MRN: [id number] Date of Birth: 03/17/1934   

## 2018-05-25 ENCOUNTER — Ambulatory Visit: Payer: Medicare Other

## 2018-05-25 ENCOUNTER — Ambulatory Visit
Admission: RE | Admit: 2018-05-25 | Discharge: 2018-05-25 | Disposition: A | Discharge status: Home / Self Care | Payer: Medicare Other | Source: Ambulatory Visit | Attending: Radiation Oncology | Admitting: Radiation Oncology

## 2018-05-25 DIAGNOSIS — R2689 Other abnormalities of gait and mobility: Secondary | ICD-10-CM

## 2018-05-25 DIAGNOSIS — M6281 Muscle weakness (generalized): Secondary | ICD-10-CM

## 2018-05-25 DIAGNOSIS — R2681 Unsteadiness on feet: Secondary | ICD-10-CM

## 2018-05-25 DIAGNOSIS — R262 Difficulty in walking, not elsewhere classified: Secondary | ICD-10-CM | POA: Diagnosis not present

## 2018-05-25 DIAGNOSIS — Z510 Encounter for antineoplastic radiation therapy: Secondary | ICD-10-CM | POA: Diagnosis not present

## 2018-05-25 DIAGNOSIS — C61 Malignant neoplasm of prostate: Secondary | ICD-10-CM | POA: Diagnosis not present

## 2018-05-25 NOTE — Therapy (Addendum)
[place] MAIN [facility] SERVICES [address] [place], [place], [address] Phone: [phone]   Fax:  [phone]  Physical Therapy Treatment  Patient Details  Name: [name] MRN: [id number] Date of Birth: Jun 03, 1934 No data recorded  Encounter Date: 05/25/2018  PT End of Session - 05/25/18 1617    Visit Number  4    Number of Visits  16    Date for PT Re-Evaluation  07/13/18    Authorization Type  4/10 PN start 9/17    PT Start Time  1430    PT Stop Time  1514    PT Time Calculation (min)  44 min    Equipment Utilized During Treatment  Gait belt    Activity Tolerance  Patient tolerated treatment well    Behavior During Therapy  WFL for tasks assessed/performed       Past Medical History:  Diagnosis Date  . Arthritis   . Atherosclerotic heart disease of native coronary artery without angina pectoris 1998   - s/p CABG, occluded SVG-D1; patent LIMA-LAD, SVG-OM, SVG- RCA  . Basal cell carcinoma of eyelid 01/03/2013   2019 - R side of Nose  . Cancer [facility])    Skin cancer  . Colon polyps   . COPD (chronic obstructive pulmonary disease) ([place])   . GERD (gastroesophageal reflux disease)   . Hemorrhoid 02/10/2015  . History of hiatal hernia   . HOH (hard of hearing)   . Hypertension   . Memory loss   . Osteoporosis   . PAF (paroxysmal atrial fibrillation) ([place])   . Prostate disorder   . Sciatic pain    Chronic  . Sleep apnea    cpap  . Temporary cerebral vascular dysfunction 02/10/2015   Had negative work-up.  July, 2012.  No medication changes, had forgot them that day, got dehydrated.     Past Surgical History:  Procedure Laterality Date  . ANKLE SURGERY    . Arch aortogram and carotid aortogram  06/02/2006   Dr Oneida Alar did surgery  . BASAL CELL CARCINOMA EXCISION  04/2016   Dermatology  . CARDIAC CATHETERIZATION  01/30/1998    (Dr. Linard Millers): Native LAD & mRCA CTO. 90% D1. Mod Cx. SVG-D1 CTO. SVG-RCA & SVG-OM along with  LIMA-LAD patent;  LV NORMAL Fxn  . CAROTID ENDARTERECTOMY Left Oct. 29, 2007   Dr. Oneida Alar:  . CATARACT EXTRACTION W/PHACO Right 03/05/2017   Procedure: CATARACT EXTRACTION PHACO AND INTRAOCULAR LENS PLACEMENT (IOC);  Surgeon: Leandrew Koyanagi, MD;  Location: [facility] ORS;  Service: Ophthalmology;  Laterality: Right;  [place] 00:58.5 AP% 10.6 CDE 6.20 Fluid Pack lot # [id number]  . CATARACT EXTRACTION W/PHACO Left 04/15/2017   Procedure: CATARACT EXTRACTION PHACO AND INTRAOCULAR LENS PLACEMENT ([place])  Left;  Surgeon: Leandrew Koyanagi, MD;  Location: [place];  Service: Ophthalmology;  Laterality: Left;  . COLONOSCOPY WITH PROPOFOL N/A 09/15/2017   Procedure: COLONOSCOPY WITH PROPOFOL;  Surgeon: Lucilla Lame, MD;  Location: [facility] ENDOSCOPY;  Service: Endoscopy;  Laterality: N/A;  . CORONARY ARTERY BYPASS GRAFT  1998   LIMA-LAD, SVG-OM, SVG-RPDA, SVG-DIAG  . CPET / MET  12/2012   Mild chronotropic incompetence - read 82% of predicted; also reduced effort; peak VO2 15.7 / 75% (did not reach Max effort) -- suggested ischemic response in last 1.5 minutes of exercise. Normal pulmonary function on PFTs but poor response to  . DOPPLER ECHOCARDIOGRAPHY  11/20/2010   EF =>55%; LV norm mild aortic scelorosis  . ESOPHAGOGASTRODUODENOSCOPY (  EGD) WITH PROPOFOL N/A 09/15/2017   Procedure: ESOPHAGOGASTRODUODENOSCOPY (EGD) WITH PROPOFOL;  Surgeon: Lucilla Lame, MD;  Location: [facility] ENDOSCOPY;  Service: Endoscopy;  Laterality: N/A;  . FRACTURE SURGERY Right 03/19/2015   wrist  . HIATAL HERNIA REPAIR    . ILIAC ARTERY STENT  12/15/2005   PTA and direct stenting rgt and lft common iliac arteries  . NM GATED MYOVIEW ([place] HX)  12/2017   Low Risk - no ischemia or infarct.  EF > 65%. no RWMA  . NM MYOCAR PERF WALL MOTION  11/11/2010; February 2016   a) TM STRESS----NORMAL PERFUSION ,EF 68%; b) EF 50-55%. Normal LV function. No significant ischemia or infarction.  . ORIF ELBOW FRACTURE Right 01/01/2017   Procedure:  OPEN REDUCTION INTERNAL FIXATION (ORIF) ELBOW/OLECRANON FRACTURE;  Surgeon: Hessie Knows, MD;  Location: [facility] ORS;  Service: Orthopedics;  Laterality: Right;  . ORIF WRIST FRACTURE Right 03/19/2015   Procedure: OPEN REDUCTION INTERNAL FIXATION (ORIF) WRIST FRACTURE;  Surgeon: Hessie Knows, MD;  Location: [facility] ORS;  Service: Orthopedics;  Laterality: Right;  . THORACIC AORTA - CAROTID ANGIOGRAM  October 2007   Dr. 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 TRANSTHORACIC ECHOCARDIOGRAM  February 2016   [facility]: Normal LV function. Dilated left atrium.    There were no vitals filed for this visit.  Subjective Assessment - 05/25/18 1435    Subjective  Patient reports he has had mild back pain today but not currently. Patient arrived with R boot due to "brusied toe." Reports hasn't had time to do HEP because he has been busy with radiation treatments. Reports having 6 treatments left.     Patient is accompained by:  Family member    Pertinent History  Patient goes 5x/week for radiation therapy for prostate cancer. Is getting weaker and no longer goes to the senior center. Just started using cane, when walks a lot his low back and legs ache. He has a significant CV history including quadruple bypass, CAD w cardiac stents, on Eliquis, left carotid endarterectomy, AAA. He is functionally independent but lives alone, his partner lives 5 minutes down the road and seems to assist him with daily living. Activity as tolerated per patient report. Has history of memory deficits.     Limitations  Walking;Standing;House hold activities;Other (comment);Lifting    How long can you sit comfortably?  n/a    How long can you stand comfortably?  needs to hold onto cane.     How long can you walk comfortably?  40 ft     Patient Stated Goals  improve stability    Currently in Pain?  No/denies     Assessed 4th toe on R foot secondary to  patient report of "bruising". Per patient report x-ray was cleared. Swelling but no heat noted. Educated on completing toe flexion and extension to decrease swelling.   Nustep lvl 3 4 minutes >60rpm for cardiovascular challenge    Seated adduction ball squeezes x10 each leg 3 second holds.    Seated marches  x10 3 second holds    Seated LAQ x10 with 3 second holds.     Standing lateral toe tap with 4in step. 1 UE support and verbal cues to maintain neutral feet alignment and bring knee towards the ceiling. CGA   STS without UE support x10. Verbal cues to decrease speed and control descending motion. CGA   Tandem in //bars without UE support. 3x30 seconds each  leg. Needed occasional use of hands to maintain stability. CGA    Standing airex pad in //bars  with head turns left/right;up/down x10. Without UE support. CGA   Standing airex pad toe taps with 6in step in //bars. CGA and 1 UE support. Verbal cues to widen BOS and increase step height.    Standing step up and back down on 4in step. 1 UE support and CGA. Verbal cues to step down one foot at a time and widen BOS.   Eccentric step down on 4in step. Bilateral UE support x10 each leg. Verbal cues for foot placement on step and for upright posture.   Patient required verbal cues for body mechanics and CGA .                          PT Education - 05/25/18 1622    Education provided  Yes    Education Details  HEP compliance, exercise technique    Person(s) Educated  Patient    Methods  Explanation;Demonstration;Verbal cues    Comprehension  Verbalized understanding;Returned demonstration       PT Short Term Goals - 05/18/18 0837      PT SHORT TERM GOAL #1   Title  Patient will be independent in home exercise program to improve strength/mobility for better functional independence with ADLs.    Baseline  9/17: HEP given     Time  2    Period  Weeks    Status  New    Target Date  06/01/18      PT SHORT  TERM GOAL #2   Title  Patient will deny any falls over past 2 weeks to demonstrate improved safety awareness at home and work.     Baseline  9/17: progressive weakness    Time  2    Period  Weeks    Status  New    Target Date  06/01/18      PT SHORT TERM GOAL #3   Title  Patient will increase BLE gross strength to 4-/5 as to improve functional strength for independent gait, increased standing tolerance and increased ADL ability.    Baseline  9/17: Hip flex: L 3/5 R 3+/5 abd: 2+/5, 3-/5; add 2/5 2+/5    Time  2    Period  Weeks    Status  New    Target Date  06/01/18        PT Long Term Goals - 05/19/18 1623      PT LONG TERM GOAL #1   Title  Patient will increase BLE gross strength to 4+/5 as to improve functional strength for independent gait, increased standing tolerance and increased ADL ability.    Baseline  9/17: Hip flex: L 3/5 R 3+/5 abd: 2+/5, 3-/5; add 2/5 2+/5    Time  8    Period  Weeks    Status  New      PT LONG TERM GOAL #2   Title  Patient will increase ABC scale score >80% to demonstrate better functional mobility and better confidence with ADLs.     Baseline  9/17: 68.13%    Time  8    Period  Weeks    Status  New      PT LONG TERM GOAL #3   Title  Patient will increase Berg Balance score by > 6 points (44/56)  to demonstrate decreased fall risk during functional activities.    Baseline  9/17: 38/56  Time  8    Period  Weeks    Status  New      PT LONG TERM GOAL #4   Title  Patient will increase six minute walk test distance to >1000 for progression to community ambulator and improve gait ability    Baseline  9/18: 1300 ft w/o cane    Time  8    Period  Weeks    Status  New            Plan - 05/25/18 1617    Clinical Impression Statement  Patient demonstrated improvements in STS today with better control of descending motion. However, patient continues to be challenged with eccentric control with sit to stands and eccentric step down due to  muscle weakness with frequent cueing required to decrease speed. Patient will continue to benefit from skilled physical therapy services to improve functional mobility and quality of life.     Rehab Potential  Fair    Clinical Impairments Affecting Rehab Potential  (+) good support system, high prior level of function (-) currently undergoing radiation therapy for prostate cancer, memory deficits     PT Frequency  2x / week    PT Duration  8 weeks    PT Treatment/Interventions  ADLs/Self Care Home Management;Aquatic Therapy;Cryotherapy;Moist Heat;Traction;Ultrasound;Therapeutic activities;Functional mobility training;Stair training;Gait training;DME Instruction;Therapeutic exercise;Balance training;Neuromuscular re-education;Patient/family education;Manual techniques;Passive range of motion;Energy conservation;Vestibular;Taping    PT Next Visit Plan  eccentric step down, sit to stands    PT Home Exercise Plan  see above    Consulted and Agree with Plan of Care  Patient       Patient will benefit from skilled therapeutic intervention in order to improve the following deficits and impairments:  Abnormal gait, Cardiopulmonary status limiting activity, Decreased activity tolerance, Decreased balance, Decreased knowledge of precautions, Decreased endurance, Decreased coordination, Decreased cognition, Decreased knowledge of use of DME, Decreased mobility, Difficulty walking, Decreased safety awareness, Decreased strength, Impaired flexibility, Impaired perceived functional ability, Impaired sensation, Postural dysfunction, Improper body mechanics, Pain  Visit Diagnosis: Unsteadiness on feet  Muscle weakness (generalized)  Other abnormalities of gait and mobility  Difficulty in walking, not elsewhere classified     Problem List Patient Active Problem List   Diagnosis Date Noted  . Prostate cancer ([place]) 02/25/2018  . Blood in stool   . Abnormal feces   . Stricture and stenosis of esophagus    . Benign neoplasm of ascending colon   . Benign neoplasm of descending colon   . Senile purpura ([place]) 04/29/2017  . Anemia 04/29/2017  . Frequent falls 04/29/2017  . Benign localized hyperplasia of prostate with urinary obstruction 07/15/2016  . Elevated PSA 07/15/2016  . Incomplete emptying of bladder 07/15/2016  . Urge incontinence 07/15/2016  . Hypothyroidism 04/25/2016  . Macular degeneration 04/25/2016  . Erectile dysfunction due to arterial insufficiency   . Ischemic heart disease with chronotropic incompetence   . CN (constipation) 02/10/2015  . DD (diverticular disease) 02/10/2015  . Accumulation of fluid in tissues 02/10/2015  . Elevated WBC count 02/10/2015  . Fatigue 02/10/2015  . Hypoxia 02/10/2015  . Lichen planus 16/06/9603  . Restless leg 02/10/2015  . 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 B12 deficiency 02/10/2015  . Lower extremity edema 12/14/2014  . COPD (chronic obstructive pulmonary disease) ([place]) 11/14/2014  . Post Inflammatory Lung Changes 11/14/2014  . Confusion state 11/08/2014  . Amnesia 11/08/2014  . PAF (paroxysmal atrial fibrillation) ([facility]) CHA2DS2-VASc = 4. AC = Eliquis   . Insomnia  12/09/2013  . OSA on CPAP   . Erectile dysfunction   . Dyspnea on exertion - -essentially resolved with BB dose reduction & wgt loss 03/29/2013  . Overweight (BMI 25.0-29.9) -- Wgt back up 01/17/2013  . Atherosclerotic heart disease of native coronary artery without angina pectoris - s/p CABG, occluded SVG-D1; patent LIMA-LAD, SVG-OM, SVG- RCA   . Essential hypertension   . Hyperlipidemia with target LDL less than 70   . Aorto-iliac disease ([place])   . BCC (basal cell carcinoma), eyelid 01/03/2013  . Allergic rhinitis 08/17/2009  . Arthralgia of ankle or foot 06/04/2009  . Adaptation reaction 05/28/2009  . Benign prostatic hypertrophy without urinary obstruction 05/28/2009  . Acid reflux 05/28/2009  . Arthritis, degenerative 05/28/2009  . Abnormality of aortic  arch branch 06/01/2006  . Carotid artery occlusion without infarction 06/01/2006  . PAD (peripheral artery disease) - bilateral common iliac stents 12/15/2005  . Aortoiliac occlusive disease ([place]) 11/30/2005  . Atherosclerotic heart disease of artery bypass graft 09/01/1997   Erick Blinks, SPT  This entire session was performed under direct supervision and direction of a licensed therapist/therapist assistant . I have personally read, edited and approve of the note as written.  Janna Arch, PT, DPT   05/26/2018, 7:42 AM  [place] MAIN [facility] SERVICES [address] [place], [place], [address] Phone: [phone]   Fax:  [phone]  Name: [name] MRN: [id number] Date of Birth: Sep 13, 1933

## 2018-05-26 ENCOUNTER — Ambulatory Visit
Admission: RE | Admit: 2018-05-26 | Discharge: 2018-05-26 | Disposition: A | Discharge status: Home / Self Care | Payer: Medicare Other | Source: Ambulatory Visit | Attending: Radiation Oncology | Admitting: Radiation Oncology

## 2018-05-26 DIAGNOSIS — G4733 Obstructive sleep apnea (adult) (pediatric): Secondary | ICD-10-CM | POA: Diagnosis not present

## 2018-05-26 DIAGNOSIS — C61 Malignant neoplasm of prostate: Secondary | ICD-10-CM | POA: Diagnosis not present

## 2018-05-26 DIAGNOSIS — G3184 Mild cognitive impairment, so stated: Secondary | ICD-10-CM | POA: Diagnosis not present

## 2018-05-26 DIAGNOSIS — K117 Disturbances of salivary secretion: Secondary | ICD-10-CM | POA: Diagnosis not present

## 2018-05-26 DIAGNOSIS — Z510 Encounter for antineoplastic radiation therapy: Secondary | ICD-10-CM | POA: Diagnosis not present

## 2018-05-26 DIAGNOSIS — Z9989 Dependence on other enabling machines and devices: Secondary | ICD-10-CM | POA: Diagnosis not present

## 2018-05-27 ENCOUNTER — Ambulatory Visit
Admission: RE | Admit: 2018-05-27 | Discharge: 2018-05-27 | Disposition: A | Discharge status: Home / Self Care | Payer: Medicare Other | Source: Ambulatory Visit | Attending: Radiation Oncology | Admitting: Radiation Oncology

## 2018-05-27 DIAGNOSIS — Z510 Encounter for antineoplastic radiation therapy: Secondary | ICD-10-CM | POA: Diagnosis not present

## 2018-05-27 DIAGNOSIS — C61 Malignant neoplasm of prostate: Secondary | ICD-10-CM | POA: Diagnosis not present

## 2018-05-28 ENCOUNTER — Ambulatory Visit
Admission: RE | Admit: 2018-05-28 | Discharge: 2018-05-28 | Disposition: A | Discharge status: Home / Self Care | Payer: Medicare Other | Source: Ambulatory Visit | Attending: Radiation Oncology | Admitting: Radiation Oncology

## 2018-05-28 DIAGNOSIS — Z510 Encounter for antineoplastic radiation therapy: Secondary | ICD-10-CM | POA: Diagnosis not present

## 2018-05-28 DIAGNOSIS — C61 Malignant neoplasm of prostate: Secondary | ICD-10-CM | POA: Diagnosis not present

## 2018-05-31 ENCOUNTER — Ambulatory Visit
Admission: RE | Admit: 2018-05-31 | Discharge: 2018-05-31 | Disposition: A | Discharge status: Home / Self Care | Payer: Medicare Other | Source: Ambulatory Visit | Attending: Radiation Oncology | Admitting: Radiation Oncology

## 2018-05-31 DIAGNOSIS — C61 Malignant neoplasm of prostate: Secondary | ICD-10-CM | POA: Diagnosis not present

## 2018-05-31 DIAGNOSIS — Z510 Encounter for antineoplastic radiation therapy: Secondary | ICD-10-CM | POA: Diagnosis not present

## 2018-06-01 ENCOUNTER — Ambulatory Visit
Admission: RE | Admit: 2018-06-01 | Discharge: 2018-06-01 | Disposition: A | Discharge status: Home / Self Care | Payer: Medicare Other | Source: Ambulatory Visit | Attending: Radiation Oncology | Admitting: Radiation Oncology

## 2018-06-01 DIAGNOSIS — Z510 Encounter for antineoplastic radiation therapy: Secondary | ICD-10-CM | POA: Diagnosis not present

## 2018-06-01 DIAGNOSIS — C61 Malignant neoplasm of prostate: Secondary | ICD-10-CM | POA: Insufficient documentation

## 2018-06-02 ENCOUNTER — Ambulatory Visit: Payer: Medicare Other | Attending: Radiation Oncology

## 2018-06-02 ENCOUNTER — Ambulatory Visit
Admission: RE | Admit: 2018-06-02 | Discharge: 2018-06-02 | Disposition: A | Discharge status: Home / Self Care | Payer: Medicare Other | Source: Ambulatory Visit | Attending: Radiation Oncology | Admitting: Radiation Oncology

## 2018-06-02 DIAGNOSIS — Z510 Encounter for antineoplastic radiation therapy: Secondary | ICD-10-CM | POA: Diagnosis not present

## 2018-06-02 DIAGNOSIS — R262 Difficulty in walking, not elsewhere classified: Secondary | ICD-10-CM

## 2018-06-02 DIAGNOSIS — M6281 Muscle weakness (generalized): Secondary | ICD-10-CM | POA: Diagnosis not present

## 2018-06-02 DIAGNOSIS — R2689 Other abnormalities of gait and mobility: Secondary | ICD-10-CM | POA: Insufficient documentation

## 2018-06-02 DIAGNOSIS — C61 Malignant neoplasm of prostate: Secondary | ICD-10-CM | POA: Diagnosis not present

## 2018-06-02 DIAGNOSIS — R2681 Unsteadiness on feet: Secondary | ICD-10-CM | POA: Diagnosis not present

## 2018-06-02 NOTE — Therapy (Addendum)
[place] MAIN [facility] SERVICES [address] [place], [place], [address] Phone: [phone]   Fax:  [phone]  Physical Therapy Treatment  Patient Details  Name: [name] MRN: [id number] Date of Birth: 1934-04-27 No data recorded  Encounter Date: 06/02/2018  PT End of Session - 06/02/18 1357    Visit Number  5    Number of Visits  16    Date for PT Re-Evaluation  07/13/18    Authorization Type  5/10 PN start 9/17    PT Start Time  1300    PT Stop Time  1345    PT Time Calculation (min)  45 min    Equipment Utilized During Treatment  Gait belt    Activity Tolerance  Patient tolerated treatment well;Patient limited by fatigue    Behavior During Therapy  [facility] for tasks assessed/performed       Past Medical History:  Diagnosis Date  . Arthritis   . Atherosclerotic heart disease of native coronary artery without angina pectoris 1998   - s/p CABG, occluded SVG-D1; patent LIMA-LAD, SVG-OM, SVG- RCA  . Basal cell carcinoma of eyelid 01/03/2013   2019 - R side of Nose  . Cancer [facility])    Skin cancer  . Colon polyps   . COPD (chronic obstructive pulmonary disease) ([place])   . GERD (gastroesophageal reflux disease)   . Hemorrhoid 02/10/2015  . History of hiatal hernia   . HOH (hard of hearing)   . Hypertension   . Memory loss   . Osteoporosis   . PAF (paroxysmal atrial fibrillation) ([place])   . Prostate disorder   . Sciatic pain    Chronic  . Sleep apnea    cpap  . Temporary cerebral vascular dysfunction 02/10/2015   Had negative work-up.  July, 2012.  No medication changes, had forgot them that day, got dehydrated.     Past Surgical History:  Procedure Laterality Date  . ANKLE SURGERY    . Arch aortogram and carotid aortogram  06/02/2006   Dr Oneida Alar did surgery  . BASAL CELL CARCINOMA EXCISION  04/2016   Dermatology  . CARDIAC CATHETERIZATION  01/30/1998    (Dr. Linard Millers): Native LAD & mRCA CTO. 90% D1. Mod Cx. SVG-D1 CTO.  SVG-RCA & SVG-OM along with LIMA-LAD patent;  LV NORMAL Fxn  . CAROTID ENDARTERECTOMY Left Oct. 29, 2007   Dr. Oneida Alar:  . CATARACT EXTRACTION W/PHACO Right 03/05/2017   Procedure: CATARACT EXTRACTION PHACO AND INTRAOCULAR LENS PLACEMENT (IOC);  Surgeon: Leandrew Koyanagi, MD;  Location: [facility] ORS;  Service: Ophthalmology;  Laterality: Right;  [place] 00:58.5 AP% 10.6 CDE 6.20 Fluid Pack lot # [id number]  . CATARACT EXTRACTION W/PHACO Left 04/15/2017   Procedure: CATARACT EXTRACTION PHACO AND INTRAOCULAR LENS PLACEMENT ([place])  Left;  Surgeon: Leandrew Koyanagi, MD;  Location: [place];  Service: Ophthalmology;  Laterality: Left;  . COLONOSCOPY WITH PROPOFOL N/A 09/15/2017   Procedure: COLONOSCOPY WITH PROPOFOL;  Surgeon: Lucilla Lame, MD;  Location: [facility] ENDOSCOPY;  Service: Endoscopy;  Laterality: N/A;  . CORONARY ARTERY BYPASS GRAFT  1998   LIMA-LAD, SVG-OM, SVG-RPDA, SVG-DIAG  . CPET / MET  12/2012   Mild chronotropic incompetence - read 82% of predicted; also reduced effort; peak VO2 15.7 / 75% (did not reach Max effort) -- suggested ischemic response in last 1.5 minutes of exercise. Normal pulmonary function on PFTs but poor response to  . DOPPLER ECHOCARDIOGRAPHY  11/20/2010   EF =>55%; LV norm mild aortic scelorosis  .  ESOPHAGOGASTRODUODENOSCOPY (EGD) WITH PROPOFOL N/A 09/15/2017   Procedure: ESOPHAGOGASTRODUODENOSCOPY (EGD) WITH PROPOFOL;  Surgeon: Lucilla Lame, MD;  Location: [facility] ENDOSCOPY;  Service: Endoscopy;  Laterality: N/A;  . FRACTURE SURGERY Right 03/19/2015   wrist  . HIATAL HERNIA REPAIR    . ILIAC ARTERY STENT  12/15/2005   PTA and direct stenting rgt and lft common iliac arteries  . NM GATED MYOVIEW ([place] HX)  12/2017   Low Risk - no ischemia or infarct.  EF > 65%. no RWMA  . NM MYOCAR PERF WALL MOTION  11/11/2010; February 2016   a) TM STRESS----NORMAL PERFUSION ,EF 68%; b) EF 50-55%. Normal LV function. No significant ischemia or infarction.  . ORIF ELBOW FRACTURE  Right 01/01/2017   Procedure: OPEN REDUCTION INTERNAL FIXATION (ORIF) ELBOW/OLECRANON FRACTURE;  Surgeon: Hessie Knows, MD;  Location: [facility] ORS;  Service: Orthopedics;  Laterality: Right;  . ORIF WRIST FRACTURE Right 03/19/2015   Procedure: OPEN REDUCTION INTERNAL FIXATION (ORIF) WRIST FRACTURE;  Surgeon: Hessie Knows, MD;  Location: [facility] ORS;  Service: Orthopedics;  Laterality: Right;  . THORACIC AORTA - CAROTID ANGIOGRAM  October 2007   Dr. 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 TRANSTHORACIC ECHOCARDIOGRAM  February 2016   [facility]: Normal LV function. Dilated left atrium.    There were no vitals filed for this visit.  Subjective Assessment - 06/02/18 1255    Subjective  Patient did not bring cane to therapy. Patient reports his last day of radiation is tomorrow. States he is more tired today and was told radiation treatments can "back up on you". Reports HEP compliance and no falls.     Patient is accompained by:  --    Pertinent History  Patient goes 5x/week for radiation therapy for prostate cancer. Is getting weaker and no longer goes to the senior center. Just started using cane, when walks a lot his low back and legs ache. He has a significant CV history including quadruple bypass, CAD w cardiac stents, on Eliquis, left carotid endarterectomy, AAA. He is functionally independent but lives alone, his partner lives 5 minutes down the road and seems to assist him with daily living. Activity as tolerated per patient report. Has history of memory deficits.     Limitations  Walking;Standing;House hold activities;Other (comment);Lifting    How long can you sit comfortably?  n/a    How long can you stand comfortably?  needs to hold onto cane.     How long can you walk comfortably?  40 ft     Patient Stated Goals  improve stability    Currently in Pain?  No/denies           Nustep lvl 3 4 minutes >60rpm  for cardiovascular challenge    Seated: Seated adduction ball squeezes x10 each leg 3 second holds.   Seated abduction with RTB x10 3 second holds.    Seated LAQ x10 with 3 second holds. Verbal cues for alignment   Seated weight ball raises (2000gr) upright raises x10, rotations x10. Verbal cues for upright posture and decrease back support   Standing: STS without UE support x10. 2nd set x5. Verbal cues to decrease speed and control descending motion. Arms across chest for no UE support. CGA  Eccentric step down on 4in step. Bilateral UE support x10 each leg. Verbal cues for foot placement on step and for upright posture.  Standing step up and back down on 6in step. BUE  and occasionally 1 UE support and CGA. Verbal cues to step down one foot at a time and widen BOS.   Standing walking marches in //bars x2 lengths of bars. 1 UE support. CGA. Verbal cues to increase step height  Tandem in //bars without UE support. 2x30 seconds each leg. Needed occasional use of hands to maintain stability. CGA   Standing airex pad in //bars  with head turns left/right;up/down x10. Without UE support. CGA   Standing airex pad toe taps with 6in step in //bars. CGA and 1 UE support. Verbal cues to widen BOS and increase step height.   Backwards walking x4 lengths of //bars. BUE and CGA. Verbal cues to not slap foot.      Patient required verbal cues for body mechanics and CGA .                      PT Education - 06/02/18 1304    Education provided  Yes    Education Details  exercise technique    Person(s) Educated  Patient    Methods  Explanation;Demonstration;Verbal cues    Comprehension  Verbalized understanding;Returned demonstration       PT Short Term Goals - 05/18/18 0837      PT SHORT TERM GOAL #1   Title  Patient will be independent in home exercise program to improve strength/mobility for better functional independence with ADLs.    Baseline  9/17: HEP given      Time  2    Period  Weeks    Status  New    Target Date  06/01/18      PT SHORT TERM GOAL #2   Title  Patient will deny any falls over past 2 weeks to demonstrate improved safety awareness at home and work.     Baseline  9/17: progressive weakness    Time  2    Period  Weeks    Status  New    Target Date  06/01/18      PT SHORT TERM GOAL #3   Title  Patient will increase BLE gross strength to 4-/5 as to improve functional strength for independent gait, increased standing tolerance and increased ADL ability.    Baseline  9/17: Hip flex: L 3/5 R 3+/5 abd: 2+/5, 3-/5; add 2/5 2+/5    Time  2    Period  Weeks    Status  New    Target Date  06/01/18        PT Long Term Goals - 05/19/18 1623      PT LONG TERM GOAL #1   Title  Patient will increase BLE gross strength to 4+/5 as to improve functional strength for independent gait, increased standing tolerance and increased ADL ability.    Baseline  9/17: Hip flex: L 3/5 R 3+/5 abd: 2+/5, 3-/5; add 2/5 2+/5    Time  8    Period  Weeks    Status  New      PT LONG TERM GOAL #2   Title  Patient will increase ABC scale score >80% to demonstrate better functional mobility and better confidence with ADLs.     Baseline  9/17: 68.13%    Time  8    Period  Weeks    Status  New      PT LONG TERM GOAL #3   Title  Patient will increase Berg Balance score by > 6 points (44/56)  to demonstrate decreased fall risk during  functional activities.    Baseline  9/17: 38/56    Time  8    Period  Weeks    Status  New      PT LONG TERM GOAL #4   Title  Patient will increase six minute walk test distance to >1000 for progression to community ambulator and improve gait ability    Baseline  9/18: 1300 ft w/o cane    Time  8    Period  Weeks    Status  New            Plan - 06/02/18 1355    Clinical Impression Statement  Patient was more fatigued today during therapy session likely due to nearing end of radiation treatments. Patient had  improved sit to stands with ability to complete without UE support and control eccentric motion demonstrating improved strength and eccentric control. Patient still challenged with dynamic balance tasks requiring UE support for stability or occasional stabilization due to weakness and muscle endurance. Patient will continue to benefit from skilled physical therapy to improve functional mobility and quality of life.     Rehab Potential  Fair    Clinical Impairments Affecting Rehab Potential  (+) good support system, high prior level of function (-) currently undergoing radiation therapy for prostate cancer, memory deficits     PT Frequency  2x / week    PT Duration  8 weeks    PT Treatment/Interventions  ADLs/Self Care Home Management;Aquatic Therapy;Cryotherapy;Moist Heat;Traction;Ultrasound;Therapeutic activities;Functional mobility training;Stair training;Gait training;DME Instruction;Therapeutic exercise;Balance training;Neuromuscular re-education;Patient/family education;Manual techniques;Passive range of motion;Energy conservation;Vestibular;Taping    PT Next Visit Plan  eccentric step down, sit to stands, ambulate    PT Home Exercise Plan  see above    Consulted and Agree with Plan of Care  Patient       Patient will benefit from skilled therapeutic intervention in order to improve the following deficits and impairments:  Abnormal gait, Cardiopulmonary status limiting activity, Decreased activity tolerance, Decreased balance, Decreased knowledge of precautions, Decreased endurance, Decreased coordination, Decreased cognition, Decreased knowledge of use of DME, Decreased mobility, Difficulty walking, Decreased safety awareness, Decreased strength, Impaired flexibility, Impaired perceived functional ability, Impaired sensation, Postural dysfunction, Improper body mechanics, Pain  Visit Diagnosis: Unsteadiness on feet  Muscle weakness (generalized)  Other abnormalities of gait and  mobility  Difficulty in walking, not elsewhere classified     Problem List Patient Active Problem List   Diagnosis Date Noted  . Prostate cancer ([place]) 02/25/2018  . Blood in stool   . Abnormal feces   . Stricture and stenosis of esophagus   . Benign neoplasm of ascending colon   . Benign neoplasm of descending colon   . Senile purpura ([place]) 04/29/2017  . Anemia 04/29/2017  . Frequent falls 04/29/2017  . Benign localized hyperplasia of prostate with urinary obstruction 07/15/2016  . Elevated PSA 07/15/2016  . Incomplete emptying of bladder 07/15/2016  . Urge incontinence 07/15/2016  . Hypothyroidism 04/25/2016  . Macular degeneration 04/25/2016  . Erectile dysfunction due to arterial insufficiency   . Ischemic heart disease with chronotropic incompetence   . CN (constipation) 02/10/2015  . DD (diverticular disease) 02/10/2015  . Accumulation of fluid in tissues 02/10/2015  . Elevated WBC count 02/10/2015  . Fatigue 02/10/2015  . Hypoxia 02/10/2015  . Lichen planus 32/91/9166  . Restless leg 02/10/2015  . 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 B12 deficiency 02/10/2015  . Lower extremity edema 12/14/2014  . COPD (chronic obstructive pulmonary disease) ([place]) 11/14/2014  .  Post Inflammatory Lung Changes 11/14/2014  . Confusion state 11/08/2014  . Amnesia 11/08/2014  . PAF (paroxysmal atrial fibrillation) ([facility]) CHA2DS2-VASc = 4. AC = Eliquis   . Insomnia 12/09/2013  . OSA on CPAP   . Erectile dysfunction   . Dyspnea on exertion - -essentially resolved with BB dose reduction & wgt loss 03/29/2013  . Overweight (BMI 25.0-29.9) -- Wgt back up 01/17/2013  . Atherosclerotic heart disease of native coronary artery without angina pectoris - s/p CABG, occluded SVG-D1; patent LIMA-LAD, SVG-OM, SVG- RCA   . Essential hypertension   . Hyperlipidemia with target LDL less than 70   . Aorto-iliac disease ([place])   . BCC (basal cell carcinoma), eyelid 01/03/2013  . Allergic rhinitis  08/17/2009  . Arthralgia of ankle or foot 06/04/2009  . Adaptation reaction 05/28/2009  . Benign prostatic hypertrophy without urinary obstruction 05/28/2009  . Acid reflux 05/28/2009  . Arthritis, degenerative 05/28/2009  . 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 Carotid artery occlusion without infarction 06/01/2006  . PAD (peripheral artery disease) - bilateral common iliac stents 12/15/2005  . Aortoiliac occlusive disease ([place]) 11/30/2005  . Atherosclerotic heart disease of artery bypass graft 09/01/1997   Erick Blinks, SPT This entire session was performed under direct supervision and direction of a licensed therapist/therapist assistant . I have personally read, edited and approve of the note as written. Janna Arch, PT, DPT   06/02/2018, 3:18 PM  [place] MAIN [facility] SERVICES [address] [place], [place], [address] Phone: [phone]   Fax:  [phone]  Name: [name] MRN: [id number] Date of Birth: October 06, 1933

## 2018-06-03 ENCOUNTER — Ambulatory Visit
Admission: RE | Admit: 2018-06-03 | Discharge: 2018-06-03 | Disposition: A | Discharge status: Home / Self Care | Payer: Medicare Other | Source: Ambulatory Visit | Attending: Radiation Oncology | Admitting: Radiation Oncology

## 2018-06-03 DIAGNOSIS — C61 Malignant neoplasm of prostate: Secondary | ICD-10-CM | POA: Diagnosis not present

## 2018-06-03 DIAGNOSIS — Z510 Encounter for antineoplastic radiation therapy: Secondary | ICD-10-CM | POA: Diagnosis not present

## 2018-06-07 ENCOUNTER — Ambulatory Visit: Payer: Medicare Other

## 2018-06-07 NOTE — Progress Notes (Signed)
* [facility]     Assessment and Plan:  Obstructive sleep apnea -Currently on auto CPAP was set pressure of 10-20, though it appears that his actual CPAP pressure is 5-10. -Patient has poor compliance though his control of apneas is excellent at current pressures of 5-10. -Recommended try to increase compliance, will refer to sleep mask fitting clinic to see if we can find something is a better fit for him.  Also on review of his download it appears that when he gets through the first few minutes or hour asleep with the mask he tends to wear the whole night, discussed this further today with him.  Periodic movements in sleep.  -Sleep study showed severe periodic limb movements, these persisted on the patient's titration study with an arousal index of 5. -ferritin level, 08/01/16; 46 (normal).  Can recheck ferritin if symptoms continue.  Insomnia. -improved, will continue to monitor.  Interstitial lung disease. -Doubt NSIP, changes are likely related to underlying emphysema. No active treatment required at this time.  COPD/emphysema. -Significant emphysematous changes seen on CT chest. -Continue Advair. -Increase activity level.  Lung nodule. -Right upper lobe granuloma, calcified. No further follow-up needed.  Atrial fibrillation. -Sleep apnea can contribute to atrial fibrillation, therefore treatment of sleep apnea is important part of management.  Weakness/deconditioning. -As above secondary to multiple comorbidities, patient has to try to increase his physical activity level in a safe manner.  Erectile dysfunction.  - Patient's daughter brought up that the patient is having significant sexual dysfunction since radiation for prostate cancer.  They are concerned that this was not addressed to their satisfaction.  I encouraged him to bring this up with the radiation oncologist to see what therapies/treatments might be available, they could also discuss up with  her primary care physician.  Date: 06/07/2018  MRN# [id number] [name] [birth year]   [name] is a 84 y.o. old male seen in follow up for chief complaint of  Chief Complaint  Patient presents with  . Sleep Apnea     HPI:  The patient is an 84 year old male with history of COPD, sleep apnea.  He has had trouble getting a good mask fit, he has been through several masks, he has been to the mask fitting clinic and continues to have difficulty using his mask.  He was asked to continue Advair.  He has not really been using his CPAP machine, he feels that he hates the mask. He has been very tired during the day, and daughter is present, notes that he falls asleep a lot during the day.   **CPAP download data 03/03/2018-05/31/2018>> raw data personally reviewed.  Usage greater than 4 hours is 11/90 days.  Average usage on days used 4 hours 15 minutes.  Pressure set is 10-20.  Median pressure 5, 95th percentile pressure 9, maximum pressure 10.  Residual AHI is 2.6.  This shows poor compliance with CPAP with excellent control of obstructive sleep apnea when used.  There is a discrepancy between the set pressure in the actual pressure range. **Review of download data for 1 month as a 11/26/17.  Uses greater than 4 hours is 20/30 days.  Average usage on days used is 5 hours 44 minutes.  Auto set pressure 10-20.  Median pressure 5, 95th percentile pressure is 9, maximum pressure is 11.  Residual AHI is 3.1.  **Review of his recent sleep titration study showed that his OSA was inadequately treated at the current level, and his  PLMI and arousal index remained elevated at 5. At last visit his CPAP was changed to auto 10-20.  **Review of download data 30 days as of 05/24/17; Uses greater than 4 hours is 18 days, average usage on days used was 4 hours 57 minutes, sit pressors 8, residual AHI is 15.9, central apnea index is 1.7.  Review and summary of previous testing: --PFT 11/29/14; FVC=93%;  Fev1=103%; ratio=80%. RV=58%, TLC=76%, rv/tlc=71%; Diffusion=37%; appears consistent with restriction.  --The patient has a history of OSA and COPD. Review of sleep study on 05/14/11 showed AHI of 6.9 with RDI of 41. Sleep efficiency was 53% and latency was short at 8 min. PLMI was very elevated at 85 with an arousal index of 9.  --Titration study on 12/12/14; sleep latency of 11 min; with sleep efficiency of 50%, AHI=40. Cpap titrated from 4-14; CPAP of 12 was recommended, however the patient continued to have elevated of 26 at pressure of 14.   Ct images; 12/15/15; 02/21/15--  There is persistent emphysematous changes, greatest in upper lobes. Stable RUL calcified granuloma. 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 Per report it is unchanged, but to my eye it appears that it is advancing. Slight mediastinal lymphadenopathy, paratracheal areas.    Medication:   Outpatient Encounter Medications as of 06/08/2018  Medication Sig  . acetaminophen (TYLENOL) 500 MG tablet Take 500-1,000 mg by mouth every 6 (six) hours as needed (for pain.).  [name] ADVAIR DISKUS 250-50 MCG/DOSE AEPB INHALE 1 PUFF INTO THE LUNGS TWICE DAILY  . apixaban (ELIQUIS) 5 MG TABS tablet Take 1 tablet (5 mg total) by mouth 2 (two) times daily.  . Ascorbic Acid (VITAMIN C) 1000 MG tablet Take 1,000 mg by mouth daily.  . Cholecalciferol (VITAMIN D-3 PO) Take 2,000 Units by mouth every evening.   . Cyanocobalamin (VITAMIN B-12) 500 MCG SUBL Place 500 mcg under the tongue daily.   [name] DEXILANT 60 MG capsule TAKE 1 CAPSULE EVERY DAY  . donepezil (ARICEPT) 10 MG tablet Take 10 mg by mouth at bedtime.   [name] ezetimibe (ZETIA) 10 MG tablet TAKE 1 TABLET BY MOUTH DAILY  . finasteride (PROSCAR) 5 MG tablet Take 5 mg by mouth daily.  . fluticasone (FLONASE) 50 MCG/ACT nasal spray Place 2 sprays into both nostrils daily. (Patient taking differently: Place 2 sprays into both nostrils daily as needed for allergies.  )  . lisinopril (PRINIVIL,ZESTRIL) 2.5 MG tablet TAKE ONE TABLET BY MOUTH TWICE DAILY  . MAGNESIUM OXIDE PO Take 500 mg by mouth every evening.   . metoprolol tartrate (LOPRESSOR) 25 MG tablet TAKE ONE TABLET BY MOUTH TWICE DAILY  . mirabegron ER (MYRBETRIQ) 50 MG TB24 tablet Take 50 mg by mouth at bedtime.   . Multiple Vitamins-Minerals (PRESERVISION AREDS 2 PO) Take 1 tablet by mouth 2 (two) times daily.   . nitroGLYCERIN (NITROSTAT) 0.4 MG SL tablet Place 1 tablet (0.4 mg total) under the tongue every 5 (five) minutes as needed for chest pain.  . NON FORMULARY Swish and spit 1 Dose at bedtime. Nystatin elixir month wash   . nystatin (MYCOSTATIN) 100000 UNIT/ML suspension Use as directed 5 mLs in the mouth or throat daily as needed (irritation).   . rosuvastatin (CRESTOR) 20 MG tablet TAKE ONE TABLET EVERY DAY  . tamsulosin (FLOMAX) 0.4 MG CAPS capsule Take 0.4 mg by mouth daily.   Facility-Administered Encounter Medications as of 06/08/2018  Medication  . gentamicin (GARAMYCIN) injection 80 mg  . levofloxacin (LEVAQUIN)  tablet 500 mg     Allergies:  Clonazepam; Codeine; Niacin and related; Tramadol; Hydrocodone; and Oxycodone  Review of Systems:  Constitutional: Feels well. Cardiovascular: Denies chest pain, exertional chest pain.  Pulmonary: Denies hemoptysis, pleuritic chest pain.   The remainder of systems were reviewed and were found to be negative other than what is documented in the HPI.    Physical Examination:   VS: BP 120/82 (BP Location: Left Arm, Cuff Size: Large)   Pulse 93   Resp 16   Ht 6' (1.829 m)   Wt 220 lb (99.8 kg)   SpO2 100%   BMI 29.84 kg/m   General Appearance: No distress  Neuro:without focal findings, mental status, speech normal, alert and oriented HEENT: PERRLA, EOM intact Pulmonary: No wheezing, No rales  CardiovascularNormal S1,S2.  No m/r/g.  Abdomen: Benign, Soft, non-tender, No masses Renal:  No costovertebral tenderness  GU:  No  performed at this time. Endoc: No evident thyromegaly, no signs of acromegaly or Cushing features Skin:   warm, no rashes, no ecchymosis  Extremities: normal, no cyanosis, clubbing.     LABORATORY PANEL:   CBC No results for input(s): WBC, HGB, HCT, PLT in the last 168 hours. ------------------------------------------------------------------------------------------------------------------  Chemistries  No results for input(s): NA, K, CL, CO2, GLUCOSE, BUN, CREATININE, CALCIUM, MG, AST, ALT, ALKPHOS, BILITOT in the last 168 hours.  Invalid input(s): GFRCGP ------------------------------------------------------------------------------------------------------------------  Cardiac Enzymes No results for input(s): TROPONINI in the last 168 hours. ------------------------------------------------------------  RADIOLOGY:   No results found for this or any previous visit. Results for orders placed during the hospital encounter of 06/02/16  DG Chest 2 View   Narrative CLINICAL DATA:  Fatigue and weakness.  EXAM: CHEST  2 VIEW  COMPARISON:  CT of the chest on 12/05/2015  FINDINGS: The heart size and mediastinal contours are within normal limits and stable post CABG. Stable calcified granuloma at the right lung apex. Stable bilateral parenchymal scarring. There is no evidence of pulmonary edema, consolidation, pneumothorax, nodule or pleural fluid. The visualized skeletal structures are unremarkable.  IMPRESSION: No active cardiopulmonary disease.   Electronically Signed   By: Aletta Edouard M.D.   On: 06/02/2016 16:19    ------------------------------------------------------------------------------------------------------------------  Thank  you for allowing [facility] to assist in the care of your patient. Our recommendations are noted above.  Please contact us if we can be of further service.  Marda Stalker, M.D., F.C.C.P.  Board  Certified in Internal Medicine, Pulmonary Medicine, [place], and Sleep Medicine.   Pulmonary and Critical Care Office Number: [phone]  06/07/2018

## 2018-06-08 ENCOUNTER — Ambulatory Visit (INDEPENDENT_AMBULATORY_CARE_PROVIDER_SITE_OTHER): Payer: Medicare Other | Admitting: Internal Medicine

## 2018-06-08 VITALS — BP 120/82 | HR 93 | Resp 16 | Ht 72.0 in | Wt 220.0 lb

## 2018-06-08 DIAGNOSIS — I779 Disorder of arteries and arterioles, unspecified: Secondary | ICD-10-CM | POA: Diagnosis not present

## 2018-06-08 DIAGNOSIS — G4733 Obstructive sleep apnea (adult) (pediatric): Secondary | ICD-10-CM

## 2018-06-08 DIAGNOSIS — Z9989 Dependence on other enabling machines and devices: Secondary | ICD-10-CM

## 2018-06-08 NOTE — Addendum Note (Signed)
Addended by: Stephanie Coup on: 06/08/2018 10:27 AM   Modules accepted: Orders

## 2018-06-08 NOTE — Patient Instructions (Signed)
Will refer to mask fitting clinic for mask fitting and "PAP-NAP"

## 2018-06-10 ENCOUNTER — Ambulatory Visit: Payer: Medicare Other

## 2018-06-16 ENCOUNTER — Ambulatory Visit: Payer: Medicare Other

## 2018-06-18 ENCOUNTER — Telehealth: Payer: Self-pay | Admitting: Internal Medicine

## 2018-06-18 NOTE — Telephone Encounter (Signed)
Son returned call and appointment has been scheduled for Tues 07/06/18 at 11:00. Advised son to bring current mask and any mask he has used in the past.  Son given address of the sleep center [facility]. [address] floor, [address], [place] [address]. Son voiced understanding and Karna Christmas will contact son if an earlier appointment becomes available. Rhonda J Cobb

## 2018-06-18 NOTE — Telephone Encounter (Signed)
PAP NAP scheduled for Tues 07/13/18 at 9:00 at [place]. Bring new mask to have Pap Nap.  Called and spoke with pt's son, [name] and he is aware of this appointment also. Rhonda J Cobb

## 2018-06-18 NOTE — Telephone Encounter (Signed)
ATC son and LMOVM for him to return my call to schedule Mask Fit appointment.  Rhonda J Cobb

## 2018-06-18 NOTE — Telephone Encounter (Signed)
Patient daughter calling wanting to know about CPAP mask fitting  She states we can call patient son back [name]   Please advise

## 2018-06-21 ENCOUNTER — Ambulatory Visit: Payer: Medicare Other

## 2018-06-21 DIAGNOSIS — R2681 Unsteadiness on feet: Secondary | ICD-10-CM

## 2018-06-21 DIAGNOSIS — M6281 Muscle weakness (generalized): Secondary | ICD-10-CM

## 2018-06-21 DIAGNOSIS — R262 Difficulty in walking, not elsewhere classified: Secondary | ICD-10-CM | POA: Diagnosis not present

## 2018-06-21 DIAGNOSIS — R2689 Other abnormalities of gait and mobility: Secondary | ICD-10-CM

## 2018-06-21 NOTE — Therapy (Addendum)
[place] MAIN [facility] SERVICES [address] [place], [place], [address] Phone: [phone]   Fax:  [phone]  Physical Therapy Treatment  Patient Details  Name: [name] MRN: [id number] Date of Birth: Apr 24, 1934 No data recorded  Encounter Date: 06/21/2018  PT End of Session - 06/21/18 1501    Visit Number  6    Number of Visits  16    Date for PT Re-Evaluation  07/13/18    Authorization Type  6/10 PN start 9/17    PT Start Time  1300    PT Stop Time  1345    PT Time Calculation (min)  45 min    Equipment Utilized During Treatment  Gait belt    Activity Tolerance  Patient tolerated treatment well;Patient limited by fatigue    Behavior During Therapy  [facility] for tasks assessed/performed       Past Medical History:  Diagnosis Date  . Arthritis   . Atherosclerotic heart disease of native coronary artery without angina pectoris 1998   - s/p CABG, occluded SVG-D1; patent LIMA-LAD, SVG-OM, SVG- RCA  . Basal cell carcinoma of eyelid 01/03/2013   2019 - R side of Nose  . Cancer [facility])    Skin cancer  . Colon polyps   . COPD (chronic obstructive pulmonary disease) ([place])   . GERD (gastroesophageal reflux disease)   . Hemorrhoid 02/10/2015  . History of hiatal hernia   . HOH (hard of hearing)   . Hypertension   . Memory loss   . Osteoporosis   . PAF (paroxysmal atrial fibrillation) ([place])   . Prostate disorder   . Sciatic pain    Chronic  . Sleep apnea    cpap  . Temporary cerebral vascular dysfunction 02/10/2015   Had negative work-up.  July, 2012.  No medication changes, had forgot them that day, got dehydrated.     Past Surgical History:  Procedure Laterality Date  . ANKLE SURGERY    . Arch aortogram and carotid aortogram  06/02/2006   Dr Oneida Alar did surgery  . BASAL CELL CARCINOMA EXCISION  04/2016   Dermatology  . CARDIAC CATHETERIZATION  01/30/1998    (Dr. Linard Millers): Native LAD & mRCA CTO. 90% D1. Mod Cx. SVG-D1 CTO.  SVG-RCA & SVG-OM along with LIMA-LAD patent;  LV NORMAL Fxn  . CAROTID ENDARTERECTOMY Left Oct. 29, 2007   Dr. Oneida Alar:  . CATARACT EXTRACTION W/PHACO Right 03/05/2017   Procedure: CATARACT EXTRACTION PHACO AND INTRAOCULAR LENS PLACEMENT (IOC);  Surgeon: Leandrew Koyanagi, MD;  Location: [facility] ORS;  Service: Ophthalmology;  Laterality: Right;  [place] 00:58.5 AP% 10.6 CDE 6.20 Fluid Pack lot # [id number]  . CATARACT EXTRACTION W/PHACO Left 04/15/2017   Procedure: CATARACT EXTRACTION PHACO AND INTRAOCULAR LENS PLACEMENT ([place])  Left;  Surgeon: Leandrew Koyanagi, MD;  Location: [place];  Service: Ophthalmology;  Laterality: Left;  . COLONOSCOPY WITH PROPOFOL N/A 09/15/2017   Procedure: COLONOSCOPY WITH PROPOFOL;  Surgeon: Lucilla Lame, MD;  Location: [facility] ENDOSCOPY;  Service: Endoscopy;  Laterality: N/A;  . CORONARY ARTERY BYPASS GRAFT  1998   LIMA-LAD, SVG-OM, SVG-RPDA, SVG-DIAG  . CPET / MET  12/2012   Mild chronotropic incompetence - read 82% of predicted; also reduced effort; peak VO2 15.7 / 75% (did not reach Max effort) -- suggested ischemic response in last 1.5 minutes of exercise. Normal pulmonary function on PFTs but poor response to  . DOPPLER ECHOCARDIOGRAPHY  11/20/2010   EF =>55%; LV norm mild aortic scelorosis  .  ESOPHAGOGASTRODUODENOSCOPY (EGD) WITH PROPOFOL N/A 09/15/2017   Procedure: ESOPHAGOGASTRODUODENOSCOPY (EGD) WITH PROPOFOL;  Surgeon: Lucilla Lame, MD;  Location: [facility] ENDOSCOPY;  Service: Endoscopy;  Laterality: N/A;  . FRACTURE SURGERY Right 03/19/2015   wrist  . HIATAL HERNIA REPAIR    . ILIAC ARTERY STENT  12/15/2005   PTA and direct stenting rgt and lft common iliac arteries  . NM GATED MYOVIEW ([place] HX)  12/2017   Low Risk - no ischemia or infarct.  EF > 65%. no RWMA  . NM MYOCAR PERF WALL MOTION  11/11/2010; February 2016   a) TM STRESS----NORMAL PERFUSION ,EF 68%; b) EF 50-55%. Normal LV function. No significant ischemia or infarction.  . ORIF ELBOW FRACTURE  Right 01/01/2017   Procedure: OPEN REDUCTION INTERNAL FIXATION (ORIF) ELBOW/OLECRANON FRACTURE;  Surgeon: Hessie Knows, MD;  Location: [facility] ORS;  Service: Orthopedics;  Laterality: Right;  . ORIF WRIST FRACTURE Right 03/19/2015   Procedure: OPEN REDUCTION INTERNAL FIXATION (ORIF) WRIST FRACTURE;  Surgeon: Hessie Knows, MD;  Location: [facility] ORS;  Service: Orthopedics;  Laterality: Right;  . THORACIC AORTA - CAROTID ANGIOGRAM  October 2007   Dr. 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 TRANSTHORACIC ECHOCARDIOGRAM  February 2016   [facility]: Normal LV function. Dilated left atrium.    There were no vitals filed for this visit.  Subjective Assessment - 06/21/18 1305    Subjective  Patient reports he has had a busy last few weeks. States his wife fell a couple weeks ago and broke her hip but she is back home now. Reports he has neck pain but thinks it is because of the way he sleeps. Reports he has had no any falls or stumbles since last visit. Reports he has not done his exercises. Per daughter's report patient unable to come last week because "he was out of it."    Patient is accompained by:  Family member    Pertinent History  Patient goes 5x/week for radiation therapy for prostate cancer. Is getting weaker and no longer goes to the senior center. Just started using cane, when walks a lot his low back and legs ache. He has a significant CV history including quadruple bypass, CAD w cardiac stents, on Eliquis, left carotid endarterectomy, AAA. He is functionally independent but lives alone, his partner lives 5 minutes down the road and seems to assist him with daily living. Activity as tolerated per patient report. Has history of memory deficits.     Limitations  Walking;Standing;House hold activities;Other (comment);Lifting    How long can you sit comfortably?  n/a    How long can you stand comfortably?  needs to hold  onto cane.     How long can you walk comfortably?  40 ft     Patient Stated Goals  improve stability    Currently in Pain?  Yes    Pain Score  8     Pain Location  Neck    Pain Descriptors / Indicators  Aching    Pain Type  Acute pain    Pain Onset  In the past 7 days              Nustep lvl 4 4 minutes >60rpm for cardiovascular challenge    Seated: Seated adduction ball squeezes x10 each leg 3 second holds.    Seated abduction with RTB x10 3 second holds.    Seated LAQ x10 with 3 second holds. Visual cues  for technique    Standing: STS with 1 UE support x10. 2 Verbal cues to decrease speed and control descending motion. CGA   Standing lateral step over orange hurdle x10 each LE. BUE support. CGA. Verbal cues for upright posture.  Standing step over and back orange hurdle x10 each LE. BUE support. CGA    Standing airex pad in //bars  with head turns left/right;up/down x10. Without UE support. CGA   Standing airex pad toe taps with 6in step in //bars. CGA and  BUE support. Verbal cues to widen BOS and increase step height.    Seated and standing exercises intermingled due to patient fatigue                 PT Education - 06/21/18 1502    Education provided  Yes    Education Details  exercise technique, HEP, therapy compliance    Person(s) Educated  Patient;Child(ren)    Methods  Explanation;Demonstration;Verbal cues;Handout    Comprehension  Verbalized understanding;Returned demonstration;Need further instruction       PT Short Term Goals - 05/18/18 1448      PT SHORT TERM GOAL #1   Title  Patient will be independent in home exercise program to improve strength/mobility for better functional independence with ADLs.    Baseline  9/17: HEP given     Time  2    Period  Weeks    Status  New    Target Date  06/01/18      PT SHORT TERM GOAL #2   Title  Patient will deny any falls over past 2 weeks to demonstrate improved safety awareness at home and  work.     Baseline  9/17: progressive weakness    Time  2    Period  Weeks    Status  New    Target Date  06/01/18      PT SHORT TERM GOAL #3   Title  Patient will increase BLE gross strength to 4-/5 as to improve functional strength for independent gait, increased standing tolerance and increased ADL ability.    Baseline  9/17: Hip flex: L 3/5 R 3+/5 abd: 2+/5, 3-/5; add 2/5 2+/5    Time  2    Period  Weeks    Status  New    Target Date  06/01/18        PT Long Term Goals - 05/19/18 1623      PT LONG TERM GOAL #1   Title  Patient will increase BLE gross strength to 4+/5 as to improve functional strength for independent gait, increased standing tolerance and increased ADL ability.    Baseline  9/17: Hip flex: L 3/5 R 3+/5 abd: 2+/5, 3-/5; add 2/5 2+/5    Time  8    Period  Weeks    Status  New      PT LONG TERM GOAL #2   Title  Patient will increase ABC scale score >80% to demonstrate better functional mobility and better confidence with ADLs.     Baseline  9/17: 68.13%    Time  8    Period  Weeks    Status  New      PT LONG TERM GOAL #3   Title  Patient will increase Berg Balance score by > 6 points (44/56)  to demonstrate decreased fall risk during functional activities.    Baseline  9/17: 38/56    Time  8    Period  Weeks  Status  New      PT LONG TERM GOAL #4   Title  Patient will increase six minute walk test distance to >1000 for progression to community ambulator and improve gait ability    Baseline  9/18: 1300 ft w/o cane    Time  8    Period  Weeks    Status  New            Plan - 06/21/18 1513    Clinical Impression Statement  Patient returns to therapy following approximately 3 week absence due to wife falling and breaking hip, not feeling well/confusion, and increased fatigue following end of radiation treatment. Was unable to ask daughter about details regarding patient absence last week and confusion due to being in public environment. Patient  had increased weakness and fatigue when compared to last session possibly due to effects of radiation treatment and/or lack of therapy and HEP compliance. Patient and daughter were educated about therapy and HEP compliance to demonstrate progress and importance of completing exercises. Patient and daughter verbally agreed and understood importance of attendance. A new handout of HEP was provided. Patient will continue to benefit from skilled physical therapy to improve strength, balance, and functional mobility and quality of life.     Rehab Potential  Fair    Clinical Impairments Affecting Rehab Potential  (+) good support system, high prior level of function (-) currently undergoing radiation therapy for prostate cancer, memory deficits     PT Frequency  2x / week    PT Duration  8 weeks    PT Treatment/Interventions  ADLs/Self Care Home Management;Aquatic Therapy;Cryotherapy;Moist Heat;Traction;Ultrasound;Therapeutic activities;Functional mobility training;Stair training;Gait training;DME Instruction;Therapeutic exercise;Balance training;Neuromuscular re-education;Patient/family education;Manual techniques;Passive range of motion;Energy conservation;Vestibular;Taping    PT Next Visit Plan  eccentric step down, sit to stands, ambulate    PT Home Exercise Plan  see above    Consulted and Agree with Plan of Care  Patient       Patient will benefit from skilled therapeutic intervention in order to improve the following deficits and impairments:  Abnormal gait, Cardiopulmonary status limiting activity, Decreased activity tolerance, Decreased balance, Decreased knowledge of precautions, Decreased endurance, Decreased coordination, Decreased cognition, Decreased knowledge of use of DME, Decreased mobility, Difficulty walking, Decreased safety awareness, Decreased strength, Impaired flexibility, Impaired perceived functional ability, Impaired sensation, Postural dysfunction, Improper body mechanics,  Pain  Visit Diagnosis: Unsteadiness on feet  Muscle weakness (generalized)  Other abnormalities of gait and mobility  Difficulty in walking, not elsewhere classified     Problem List Patient Active Problem List   Diagnosis Date Noted  . Prostate cancer ([place]) 02/25/2018  . Blood in stool   . Abnormal feces   . Stricture and stenosis of esophagus   . Benign neoplasm of ascending colon   . Benign neoplasm of descending colon   . Senile purpura ([place]) 04/29/2017  . Anemia 04/29/2017  . Frequent falls 04/29/2017  . Benign localized hyperplasia of prostate with urinary obstruction 07/15/2016  . Elevated PSA 07/15/2016  . Incomplete emptying of bladder 07/15/2016  . Urge incontinence 07/15/2016  . Hypothyroidism 04/25/2016  . Macular degeneration 04/25/2016  . Erectile dysfunction due to arterial insufficiency   . Ischemic heart disease with chronotropic incompetence   . CN (constipation) 02/10/2015  . DD (diverticular disease) 02/10/2015  . Accumulation of fluid in tissues 02/10/2015  . Elevated WBC count 02/10/2015  . Fatigue 02/10/2015  . Hypoxia 02/10/2015  . Lichen planus 76/81/1572  . Restless leg 02/10/2015  .  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 B12 deficiency 02/10/2015  . Lower extremity edema 12/14/2014  . COPD (chronic obstructive pulmonary disease) ([place]) 11/14/2014  . Post Inflammatory Lung Changes 11/14/2014  . Confusion state 11/08/2014  . Amnesia 11/08/2014  . PAF (paroxysmal atrial fibrillation) ([facility]) CHA2DS2-VASc = 4. AC = Eliquis   . Insomnia 12/09/2013  . OSA on CPAP   . Erectile dysfunction   . Dyspnea on exertion - -essentially resolved with BB dose reduction & wgt loss 03/29/2013  . Overweight (BMI 25.0-29.9) -- Wgt back up 01/17/2013  . Atherosclerotic heart disease of native coronary artery without angina pectoris - s/p CABG, occluded SVG-D1; patent LIMA-LAD, SVG-OM, SVG- RCA   . Essential hypertension   . Hyperlipidemia with target LDL  less than 70   . Aorto-iliac disease ([place])   . BCC (basal cell carcinoma), eyelid 01/03/2013  . Allergic rhinitis 08/17/2009  . Arthralgia of ankle or foot 06/04/2009  . Adaptation reaction 05/28/2009  . Benign prostatic hypertrophy without urinary obstruction 05/28/2009  . Acid reflux 05/28/2009  . Arthritis, degenerative 05/28/2009  . 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 Carotid artery occlusion without infarction 06/01/2006  . PAD (peripheral artery disease) - bilateral common iliac stents 12/15/2005  . Aortoiliac occlusive disease ([place]) 11/30/2005  . Atherosclerotic heart disease of artery bypass graft 09/01/1997   Erick Blinks, SPT   This entire session was performed under direct supervision and direction of a licensed therapist/therapist assistant . I have personally read, edited and approve of the note as written.  Janna Arch, PT, DPT   06/21/2018, 5:11 PM  [place] MAIN [facility] SERVICES [address] [place], [place], [address] Phone: [phone]   Fax:  [phone]  Name: [name] MRN: [id number] Date of Birth: 24-Sep-1933

## 2018-06-22 ENCOUNTER — Ambulatory Visit (INDEPENDENT_AMBULATORY_CARE_PROVIDER_SITE_OTHER): Payer: Medicare Other | Admitting: Family Medicine

## 2018-06-22 ENCOUNTER — Encounter: Payer: Self-pay | Admitting: Family Medicine

## 2018-06-22 VITALS — BP 138/82 | HR 89 | Temp 97.6°F | Resp 16 | Wt 216.4 lb

## 2018-06-22 DIAGNOSIS — R531 Weakness: Secondary | ICD-10-CM

## 2018-06-22 DIAGNOSIS — I2510 Atherosclerotic heart disease of native coronary artery without angina pectoris: Secondary | ICD-10-CM

## 2018-06-22 DIAGNOSIS — C187 Malignant neoplasm of sigmoid colon: Secondary | ICD-10-CM | POA: Diagnosis not present

## 2018-06-22 DIAGNOSIS — R296 Repeated falls: Secondary | ICD-10-CM | POA: Diagnosis not present

## 2018-06-22 DIAGNOSIS — Z9989 Dependence on other enabling machines and devices: Secondary | ICD-10-CM | POA: Diagnosis not present

## 2018-06-22 DIAGNOSIS — I779 Disorder of arteries and arterioles, unspecified: Secondary | ICD-10-CM | POA: Diagnosis not present

## 2018-06-22 DIAGNOSIS — G4733 Obstructive sleep apnea (adult) (pediatric): Secondary | ICD-10-CM | POA: Diagnosis not present

## 2018-06-22 NOTE — Progress Notes (Signed)
Patient: [name] Male    DOB: 16-May-1934   84 y.o.   MRN: [id number] Visit Date: 06/22/2018  Today's Provider: Lelon Huh, MD   Chief Complaint  Patient presents with  . Fall   Subjective:    HPI Frequent Falls  Patient presents today for frequent falls and son needs FMLA paperwork filled out. He is her with his son today who has temporarily moved to area to care for his father and get placed. He reports patient has had several near falls due to trouble with balance. He is generally weak. Has been participating PT three days a week. Patient had been cared for by a male SO, who recently broke her hip and is not able to help care for mr [name]. He does have long term care insurance and is looking into getting home health aid or facility placement. Patient's son states that patient frequently takes his CPAP off in the middle of night resulting in him being extremely weak and fatigued the next day. He has found that if he checks and makes sure CPAP stays on that patient is much more alert, energetic, and feels better the following day. He is hoping he can arrange a for a caregiver to assist with keeping CPAP on patient.      Allergies  Allergen Reactions  . Clonazepam Other (See Comments)    Altered mental status  . Codeine Other (See Comments)    Altered mental status.  . Niacin And Related Other (See Comments)    Flushing of skin  . Tramadol     Other reaction(s): Hallucination  . Hydrocodone     confusion  . Oxycodone Other (See Comments)    confusion confusion   Patient Active Problem List   Diagnosis Date Noted  . Prostate cancer ([place]) 02/25/2018  . Prostatic cyst 01/15/2018  . 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 ([place]) 09/15/2017  . Blood in stool   . Abnormal feces   . Stricture and stenosis of esophagus   . Mild cognitive impairment 08/18/2017  . Senile purpura ([place]) 04/29/2017  . Anemia 04/29/2017  . Frequent falls 04/29/2017  . Benign  localized hyperplasia of prostate with urinary obstruction 07/15/2016  . Elevated PSA 07/15/2016  . Incomplete emptying of bladder 07/15/2016  . Urge incontinence 07/15/2016  . Hypothyroidism 04/25/2016  . Macular degeneration 04/25/2016  . Erectile dysfunction due to arterial insufficiency   . Ischemic heart disease with chronotropic incompetence   . CN (constipation) 02/10/2015  . DD (diverticular disease) 02/10/2015  . Fatigue 02/10/2015  . Lichen planus 97/41/6384  . Restless leg 02/10/2015  . 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 B12 deficiency 02/10/2015  . COPD (chronic obstructive pulmonary disease) ([place]) 11/14/2014  . Post Inflammatory Lung Changes 11/14/2014  . PAF (paroxysmal atrial fibrillation) (HCC) CHA2DS2-VASc = 4. AC = Eliquis   . Insomnia 12/09/2013  . OSA on CPAP   . Dyspnea on exertion - -essentially resolved with BB dose reduction & wgt loss 03/29/2013  . Overweight (BMI 25.0-29.9) -- Wgt back up 01/17/2013  . Atherosclerotic heart disease of native coronary artery without angina pectoris - s/p CABG, occluded SVG-D1; patent LIMA-LAD, SVG-OM, SVG- RCA   . Essential hypertension   . Hyperlipidemia with target LDL less than 70   . Aorto-iliac disease ([place])   . BCC (basal cell carcinoma), eyelid 01/03/2013  . Allergic rhinitis 08/17/2009  . Adaptation reaction 05/28/2009  . Acid reflux 05/28/2009  . Arthritis, degenerative 05/28/2009  .  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 Carotid artery occlusion without infarction 06/01/2006  . PAD (peripheral artery disease) - bilateral common iliac stents 12/15/2005  . Aortoiliac occlusive disease ([place]) 11/30/2005  . Atherosclerotic heart disease of artery bypass graft 09/01/1997   Patient Care Team    Relationship Specialty Notifications Start End  [name], MD PCP - General Family Medicine  03/19/16   Leonie Man, MD Consulting Physician Cardiology  10/19/14   Dasher, Rayvon Char, MD  Dermatology   10/19/14   Nickel, Sharmon Leyden, NP Nurse Practitioner Vascular Surgery  04/25/16   Laverle Hobby, MD Consulting Physician Pulmonary Disease  10/23/16   Hessie Knows, MD Consulting Physician Orthopedic Surgery  12/23/16   Leandrew Koyanagi, MD Referring Physician Ophthalmology  04/29/17   Abbie Sons, MD  Urology  06/22/18      Current Outpatient Medications:  .  acetaminophen (TYLENOL) 500 MG tablet, Take 500-1,000 mg by mouth every 6 (six) hours as needed (for pain.)., Disp: , Rfl:  .  ADVAIR DISKUS 250-50 MCG/DOSE AEPB, INHALE 1 PUFF INTO THE LUNGS TWICE DAILY, Disp: 60 each, Rfl: 5 .  apixaban (ELIQUIS) 5 MG TABS tablet, Take 1 tablet (5 mg total) by mouth 2 (two) times daily., Disp: 180 tablet, Rfl: 2 .  Ascorbic Acid (VITAMIN C) 1000 MG tablet, Take 1,000 mg by mouth daily., Disp: , Rfl:  .  Cholecalciferol (VITAMIN D-3 PO), Take 2,000 Units by mouth every evening. , Disp: , Rfl:  .  Cyanocobalamin (VITAMIN B-12) 500 MCG SUBL, Place 500 mcg under the tongue daily. , Disp: , Rfl:  .  DEXILANT 60 MG capsule, TAKE 1 CAPSULE EVERY DAY, Disp: 30 capsule, Rfl: 11 .  donepezil (ARICEPT) 10 MG tablet, Take 10 mg by mouth at bedtime. , Disp: , Rfl:  .  ezetimibe (ZETIA) 10 MG tablet, TAKE 1 TABLET BY MOUTH DAILY, Disp: 30 tablet, Rfl: 5 .  finasteride (PROSCAR) 5 MG tablet, Take 5 mg by mouth daily., Disp: , Rfl:  .  fluticasone (FLONASE) 50 MCG/ACT nasal spray, Place 2 sprays into both nostrils daily. (Patient taking differently: Place 2 sprays into both nostrils daily as needed for allergies. ), Disp: 16 g, Rfl: 6 .  lisinopril (PRINIVIL,ZESTRIL) 2.5 MG tablet, TAKE ONE TABLET BY MOUTH TWICE DAILY, Disp: 180 tablet, Rfl: 4 .  MAGNESIUM OXIDE PO, Take 500 mg by mouth every evening. , Disp: , Rfl:  .  metoprolol tartrate (LOPRESSOR) 25 MG tablet, TAKE ONE TABLET BY MOUTH TWICE DAILY, Disp: 180 tablet, Rfl: 1 .  mirabegron ER (MYRBETRIQ) 50 MG TB24 tablet, Take 50 mg by mouth at bedtime.  , Disp: , Rfl:  .  Multiple Vitamins-Minerals (PRESERVISION AREDS 2 PO), Take 1 tablet by mouth 2 (two) times daily. , Disp: , Rfl:  .  nitroGLYCERIN (NITROSTAT) 0.4 MG SL tablet, Place 1 tablet (0.4 mg total) under the tongue every 5 (five) minutes as needed for chest pain., Disp: 20 tablet, Rfl: 1 .  NON FORMULARY, Swish and spit 1 Dose at bedtime. Nystatin elixir month wash , Disp: , Rfl:  .  nystatin (MYCOSTATIN) 100000 UNIT/ML suspension, Use as directed 5 mLs in the mouth or throat daily as needed (irritation). , Disp: , Rfl:  .  rosuvastatin (CRESTOR) 20 MG tablet, TAKE ONE TABLET EVERY DAY, Disp: 90 tablet, Rfl: 4 .  tamsulosin (FLOMAX) 0.4 MG CAPS capsule, Take 0.4 mg by mouth daily., Disp: , Rfl:   Current Facility-Administered Medications:  .  gentamicin (GARAMYCIN) injection 80 mg, 80 mg, Intramuscular, Once, Stoioff, Scott C, MD .  levofloxacin (LEVAQUIN) tablet 500 mg, 500 mg, Oral, Once, Stoioff, Ronda Fairly, MD  Review of Systems  Constitutional: Positive for appetite change.  HENT: Negative.   Eyes: Negative.   Respiratory: Positive for apnea.   Cardiovascular: Negative.   Gastrointestinal: Negative.   Musculoskeletal: Negative.   Skin: Negative.   Allergic/Immunologic: Negative.   Neurological: Negative.   Hematological: Negative.   Psychiatric/Behavioral: Negative.     Social History   Tobacco Use  . Smoking status: Former Smoker    Types: Cigarettes    Last attempt to quit: 08/04/1997    Years since quitting: 20.8  . Smokeless tobacco: Never Used  Substance Use Topics  . Alcohol use: No   Objective:   BP 138/82 (BP Location: Right Arm, Patient Position: Sitting, Cuff Size: Normal)   Pulse 89   Temp 97.6 F (36.4 C) (Oral)   Resp 16   Wt 216 lb 6.4 oz (98.2 kg)   SpO2 96%   BMI 29.35 kg/m  Vitals:   06/22/18 1030  BP: 138/82  Pulse: 89  Resp: 16  Temp: 97.6 F (36.4 C)  TempSrc: Oral  SpO2: 96%  Weight: 216 lb 6.4 oz (98.2 kg)     Physical  Exam   General Appearance:    Alert, cooperative, no distress  Eyes:    PERRL, conjunctiva/corneas clear, EOM's intact       Lungs:     Clear to auscultation bilaterally, respirations unlabored  Heart:    Regular rate and rhythm  Neurologic:   Awake, alert, oriented x 3. No apparent focal neurological           defect.         Assessment & Plan:     1. Atherosclerosis of native coronary artery of native heart without angina pectoris Asymptomatic. Compliant with medication.  Continue aggressive risk factor modification.    2. Falls frequently   3. OSA on CPAP Patient in need of caretaker or placement in intermediate care facility. Will complete [place]-2 form when placement is determined.   4. Weakness   5. 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 ([place])        Lelon Huh, MD  [facility]

## 2018-06-23 ENCOUNTER — Ambulatory Visit: Payer: Medicare Other

## 2018-06-23 DIAGNOSIS — R262 Difficulty in walking, not elsewhere classified: Secondary | ICD-10-CM | POA: Diagnosis not present

## 2018-06-23 DIAGNOSIS — M6281 Muscle weakness (generalized): Secondary | ICD-10-CM | POA: Diagnosis not present

## 2018-06-23 DIAGNOSIS — R2689 Other abnormalities of gait and mobility: Secondary | ICD-10-CM | POA: Diagnosis not present

## 2018-06-23 DIAGNOSIS — R2681 Unsteadiness on feet: Secondary | ICD-10-CM

## 2018-06-23 NOTE — Therapy (Addendum)
[place] MAIN [facility] SERVICES [address] [place], [place], [address] Phone: [phone]   Fax:  [phone]  Physical Therapy Treatment  Patient Details  Name: [name] MRN: [id number] Date of Birth: 1933/11/25 No data recorded  Encounter Date: 06/23/2018  PT End of Session - 06/23/18 1318    Visit Number  7    Number of Visits  16    Date for PT Re-Evaluation  07/13/18    Authorization Type  7/10 PN start 9/17    PT Start Time  1258    PT Stop Time  1343    PT Time Calculation (min)  45 min    Equipment Utilized During Treatment  Gait belt    Activity Tolerance  Patient tolerated treatment well;Patient limited by fatigue    Behavior During Therapy  [facility] for tasks assessed/performed       Past Medical History:  Diagnosis Date  . Arthritis   . Atherosclerotic heart disease of native coronary artery without angina pectoris 1998   - s/p CABG, occluded SVG-D1; patent LIMA-LAD, SVG-OM, SVG- RCA  . Basal cell carcinoma of eyelid 01/03/2013   2019 - R side of Nose  . Cancer [facility])    Skin cancer  . Colon polyps   . COPD (chronic obstructive pulmonary disease) ([place])   . GERD (gastroesophageal reflux disease)   . Hemorrhoid 02/10/2015  . History of hiatal hernia   . HOH (hard of hearing)   . Hypertension   . Memory loss   . Osteoporosis   . PAF (paroxysmal atrial fibrillation) ([place])   . Prostate disorder   . Sciatic pain    Chronic  . Sleep apnea    cpap  . Temporary cerebral vascular dysfunction 02/10/2015   Had negative work-up.  July, 2012.  No medication changes, had forgot them that day, got dehydrated.     Past Surgical History:  Procedure Laterality Date  . ANKLE SURGERY    . Arch aortogram and carotid aortogram  06/02/2006   Dr Oneida Alar did surgery  . BASAL CELL CARCINOMA EXCISION  04/2016   Dermatology  . CARDIAC CATHETERIZATION  01/30/1998    (Dr. Linard Millers): Native LAD & mRCA CTO. 90% D1. Mod Cx. SVG-D1 CTO.  SVG-RCA & SVG-OM along with LIMA-LAD patent;  LV NORMAL Fxn  . CAROTID ENDARTERECTOMY Left Oct. 29, 2007   Dr. Oneida Alar:  . CATARACT EXTRACTION W/PHACO Right 03/05/2017   Procedure: CATARACT EXTRACTION PHACO AND INTRAOCULAR LENS PLACEMENT (IOC);  Surgeon: Leandrew Koyanagi, MD;  Location: [facility] ORS;  Service: Ophthalmology;  Laterality: Right;  [place] 00:58.5 AP% 10.6 CDE 6.20 Fluid Pack lot # [id number]  . CATARACT EXTRACTION W/PHACO Left 04/15/2017   Procedure: CATARACT EXTRACTION PHACO AND INTRAOCULAR LENS PLACEMENT ([place])  Left;  Surgeon: Leandrew Koyanagi, MD;  Location: [place];  Service: Ophthalmology;  Laterality: Left;  . COLONOSCOPY WITH PROPOFOL N/A 09/15/2017   Procedure: COLONOSCOPY WITH PROPOFOL;  Surgeon: Lucilla Lame, MD;  Location: [facility] ENDOSCOPY;  Service: Endoscopy;  Laterality: N/A;  . CORONARY ARTERY BYPASS GRAFT  1998   LIMA-LAD, SVG-OM, SVG-RPDA, SVG-DIAG  . CPET / MET  12/2012   Mild chronotropic incompetence - read 82% of predicted; also reduced effort; peak VO2 15.7 / 75% (did not reach Max effort) -- suggested ischemic response in last 1.5 minutes of exercise. Normal pulmonary function on PFTs but poor response to  . DOPPLER ECHOCARDIOGRAPHY  11/20/2010   EF =>55%; LV norm mild aortic scelorosis  .  ESOPHAGOGASTRODUODENOSCOPY (EGD) WITH PROPOFOL N/A 09/15/2017   Procedure: ESOPHAGOGASTRODUODENOSCOPY (EGD) WITH PROPOFOL;  Surgeon: Lucilla Lame, MD;  Location: [facility] ENDOSCOPY;  Service: Endoscopy;  Laterality: N/A;  . FRACTURE SURGERY Right 03/19/2015   wrist  . HIATAL HERNIA REPAIR    . ILIAC ARTERY STENT  12/15/2005   PTA and direct stenting rgt and lft common iliac arteries  . NM GATED MYOVIEW ([place] HX)  12/2017   Low Risk - no ischemia or infarct.  EF > 65%. no RWMA  . NM MYOCAR PERF WALL MOTION  11/11/2010; February 2016   a) TM STRESS----NORMAL PERFUSION ,EF 68%; b) EF 50-55%. Normal LV function. No significant ischemia or infarction.  . ORIF ELBOW FRACTURE  Right 01/01/2017   Procedure: OPEN REDUCTION INTERNAL FIXATION (ORIF) ELBOW/OLECRANON FRACTURE;  Surgeon: Hessie Knows, MD;  Location: [facility] ORS;  Service: Orthopedics;  Laterality: Right;  . ORIF WRIST FRACTURE Right 03/19/2015   Procedure: OPEN REDUCTION INTERNAL FIXATION (ORIF) WRIST FRACTURE;  Surgeon: Hessie Knows, MD;  Location: [facility] ORS;  Service: Orthopedics;  Laterality: Right;  . THORACIC AORTA - CAROTID ANGIOGRAM  October 2007   Dr. 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 TRANSTHORACIC ECHOCARDIOGRAM  February 2016   [facility]: Normal LV function. Dilated left atrium.    There were no vitals filed for this visit.  Subjective Assessment - 06/23/18 1257    Subjective  Patient states he did fall a couple of weeks ago when he turned too fast in the kitchen. States he did not have his cane at the moment. States that has been the only recent fall. States he think his recent decline is due to the effects of radiation treatments and he has not had the energy to leave house. Reports son staying with him for the next 2-3weeks. Reports completing exercises since last visit.     Patient is accompained by:  Family member    Pertinent History  Patient goes 5x/week for radiation therapy for prostate cancer. Is getting weaker and no longer goes to the senior center. Just started using cane, when walks a lot his low back and legs ache. He has a significant CV history including quadruple bypass, CAD w cardiac stents, on Eliquis, left carotid endarterectomy, AAA. He is functionally independent but lives alone, his partner lives 5 minutes down the road and seems to assist him with daily living. Activity as tolerated per patient report. Has history of memory deficits.     Limitations  Walking;Standing;House hold activities;Other (comment);Lifting    How long can you sit comfortably?  n/a    How long can you stand comfortably?   needs to hold onto cane.     How long can you walk comfortably?  40 ft     Patient Stated Goals  improve stability    Currently in Pain?  Yes    Pain Score  7     Pain Location  Neck    Pain Descriptors / Indicators  Aching    Pain Type  Acute pain    Pain Onset  In the past 7 days           Nustep lvl 3 4 minutes >60rpm for cardiovascular challenge   Review HEP Seated marches x10 each LE Standing hip abduction x10 each LE. Verbal cues to keep neutral feet alignment Standing hip extension x10 each LE. Verbal and visual cues to keep knee straight Sit to stands x10 with  1 UE support. Verbal cues to sit down slowly   Exercises were observed for appropriate technique and execution.    Seated: Seated adduction ball squeezes x10 each leg 3 second holds.     Seated LAQ x10 with 3 second holds. Verbal cues for alignment   Seated gluteal squeezes without back support. x10 3 second holds   Standing:  Standing toe taps on 6in step. 1UE support. Verbal cues to increase step height for foot clearance.CGA  Standing step up and back down on 6in step. BUE and occasionally 1 UE support and CGA. Verbal cues to step down one foot at a time and widen BOS.    Standing airex pad in //bars  with head turns left/right;up/down x10. Without UE support. CGA    Exercises were balance between seated and standing due to fatigue  Patient required verbal cues for body mechanics and CGA .    Patient's doctor visit addressed to it reporting falls, yet patient has repeatedly denied falling each session.                      PT Education - 06/23/18 1309    Education provided  Yes    Education Details  exercise technique, HEP     Person(s) Educated  Patient    Methods  Explanation;Demonstration;Verbal cues    Comprehension  Verbalized understanding;Returned demonstration;Need further instruction       PT Short Term Goals - 05/18/18 3570      PT SHORT TERM GOAL #1   Title   Patient will be independent in home exercise program to improve strength/mobility for better functional independence with ADLs.    Baseline  9/17: HEP given     Time  2    Period  Weeks    Status  New    Target Date  06/01/18      PT SHORT TERM GOAL #2   Title  Patient will deny any falls over past 2 weeks to demonstrate improved safety awareness at home and work.     Baseline  9/17: progressive weakness    Time  2    Period  Weeks    Status  New    Target Date  06/01/18      PT SHORT TERM GOAL #3   Title  Patient will increase BLE gross strength to 4-/5 as to improve functional strength for independent gait, increased standing tolerance and increased ADL ability.    Baseline  9/17: Hip flex: L 3/5 R 3+/5 abd: 2+/5, 3-/5; add 2/5 2+/5    Time  2    Period  Weeks    Status  New    Target Date  06/01/18        PT Long Term Goals - 05/19/18 1623      PT LONG TERM GOAL #1   Title  Patient will increase BLE gross strength to 4+/5 as to improve functional strength for independent gait, increased standing tolerance and increased ADL ability.    Baseline  9/17: Hip flex: L 3/5 R 3+/5 abd: 2+/5, 3-/5; add 2/5 2+/5    Time  8    Period  Weeks    Status  New      PT LONG TERM GOAL #2   Title  Patient will increase ABC scale score >80% to demonstrate better functional mobility and better confidence with ADLs.     Baseline  9/17: 68.13%    Time  8  Period  Weeks    Status  New      PT LONG TERM GOAL #3   Title  Patient will increase Berg Balance score by > 6 points (44/56)  to demonstrate decreased fall risk during functional activities.    Baseline  9/17: 38/56    Time  8    Period  Weeks    Status  New      PT LONG TERM GOAL #4   Title  Patient will increase six minute walk test distance to >1000 for progression to community ambulator and improve gait ability    Baseline  9/18: 1300 ft w/o cane    Time  8    Period  Weeks    Status  New            Plan - 06/23/18  1349    Clinical Impression Statement  Patient returns to therapy with verbal reports of fall when addressed for second time following doctor visit. Patient has decreased strength and fatigue with inability to rise from chair without 1 UE support. Long term effects of cancer treatments could be impacting recent weakness and fatigue experienced. Exercises were intermingled between seated and standing due to fatigue. Patient expresses benefit and desire of continued therapy and positive impacts it can have for weakness, fatigue, and energy levels. Patient will continue to benefit from skilled physical therapy to improve strength, balance, and functional mobility.     Rehab Potential  Fair    Clinical Impairments Affecting Rehab Potential  (+) good support system, high prior level of function (-) currently undergoing radiation therapy for prostate cancer, memory deficits     PT Frequency  2x / week    PT Duration  8 weeks    PT Treatment/Interventions  ADLs/Self Care Home Management;Aquatic Therapy;Cryotherapy;Moist Heat;Traction;Ultrasound;Therapeutic activities;Functional mobility training;Stair training;Gait training;DME Instruction;Therapeutic exercise;Balance training;Neuromuscular re-education;Patient/family education;Manual techniques;Passive range of motion;Energy conservation;Vestibular;Taping    PT Next Visit Plan  strength, balance     PT Home Exercise Plan  see above    Consulted and Agree with Plan of Care  Patient       Patient will benefit from skilled therapeutic intervention in order to improve the following deficits and impairments:  Abnormal gait, Cardiopulmonary status limiting activity, Decreased activity tolerance, Decreased balance, Decreased knowledge of precautions, Decreased endurance, Decreased coordination, Decreased cognition, Decreased knowledge of use of DME, Decreased mobility, Difficulty walking, Decreased safety awareness, Decreased strength, Impaired flexibility, Impaired  perceived functional ability, Impaired sensation, Postural dysfunction, Improper body mechanics, Pain  Visit Diagnosis: Unsteadiness on feet  Muscle weakness (generalized)  Other abnormalities of gait and mobility  Difficulty in walking, not elsewhere classified     Problem List Patient Active Problem List   Diagnosis Date Noted  . Prostate cancer ([place]) 02/25/2018  . Blood in stool   . Abnormal feces   . Stricture and stenosis of esophagus   . Benign neoplasm of ascending colon   . Benign neoplasm of descending colon   . Senile purpura ([place]) 04/29/2017  . Anemia 04/29/2017  . Frequent falls 04/29/2017  . Benign localized hyperplasia of prostate with urinary obstruction 07/15/2016  . Elevated PSA 07/15/2016  . Incomplete emptying of bladder 07/15/2016  . Urge incontinence 07/15/2016  . Hypothyroidism 04/25/2016  . Macular degeneration 04/25/2016  . Erectile dysfunction due to arterial insufficiency   . Ischemic heart disease with chronotropic incompetence   . CN (constipation) 02/10/2015  . DD (diverticular disease) 02/10/2015  . Accumulation of fluid in  tissues 02/10/2015  . Elevated WBC count 02/10/2015  . Fatigue 02/10/2015  . Hypoxia 02/10/2015  . Lichen planus 95/36/9223  . Restless leg 02/10/2015  . 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 B12 deficiency 02/10/2015  . Lower extremity edema 12/14/2014  . COPD (chronic obstructive pulmonary disease) ([place]) 11/14/2014  . Post Inflammatory Lung Changes 11/14/2014  . Confusion state 11/08/2014  . Amnesia 11/08/2014  . PAF (paroxysmal atrial fibrillation) ([facility]) CHA2DS2-VASc = 4. AC = Eliquis   . Insomnia 12/09/2013  . OSA on CPAP   . Erectile dysfunction   . Dyspnea on exertion - -essentially resolved with BB dose reduction & wgt loss 03/29/2013  . Overweight (BMI 25.0-29.9) -- Wgt back up 01/17/2013  . Atherosclerotic heart disease of native coronary artery without angina pectoris - s/p CABG, occluded SVG-D1;  patent LIMA-LAD, SVG-OM, SVG- RCA   . Essential hypertension   . Hyperlipidemia with target LDL less than 70   . Aorto-iliac disease ([place])   . BCC (basal cell carcinoma), eyelid 01/03/2013  . Allergic rhinitis 08/17/2009  . Arthralgia of ankle or foot 06/04/2009  . Adaptation reaction 05/28/2009  . Benign prostatic hypertrophy without urinary obstruction 05/28/2009  . Acid reflux 05/28/2009  . Arthritis, degenerative 05/28/2009  . 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 Carotid artery occlusion without infarction 06/01/2006  . PAD (peripheral artery disease) - bilateral common iliac stents 12/15/2005  . Aortoiliac occlusive disease ([place]) 11/30/2005  . Atherosclerotic heart disease of artery bypass graft 09/01/1997   Erick Blinks, SPT  This entire session was performed under direct supervision and direction of a licensed therapist/therapist assistant . I have personally read, edited and approve of the note as written.  Janna Arch, PT, DPT   06/23/2018, 3:40 PM  [place] MAIN [facility] SERVICES [address] [place], [place], [address] Phone: [phone]   Fax:  [phone]  Name: SEAMUS WAREHIME MRN: [id number] Date of Birth: 13-Dec-1933

## 2018-06-24 ENCOUNTER — Telehealth: Payer: Self-pay | Admitting: Family Medicine

## 2018-06-24 ENCOUNTER — Encounter: Payer: Self-pay | Admitting: Family Medicine

## 2018-06-24 NOTE — Telephone Encounter (Signed)
Medication list was printed and given to patient son.

## 2018-06-24 NOTE — Telephone Encounter (Signed)
Could the pt's medication list be faxed to [place] at fax number [phone]  with Dr. Maralyn Sago signature.  Please call son -[name] [phone] to let him know this has been done.  Thanks, [employer]

## 2018-06-25 ENCOUNTER — Telehealth: Payer: Self-pay

## 2018-06-25 NOTE — Telephone Encounter (Signed)
Patient's son is calling Roshena back to let her know that Dr. Caryn Section forgot to sign the bottom of the medication list that was faxed yesterday. He also has a question about one on the medications listed. Please call back to confirm this was done. Contact info is correct. Thanks!

## 2018-06-28 ENCOUNTER — Other Ambulatory Visit: Payer: Self-pay | Admitting: *Deleted

## 2018-06-28 ENCOUNTER — Encounter: Payer: Self-pay | Admitting: Radiation Oncology

## 2018-06-28 ENCOUNTER — Other Ambulatory Visit: Payer: Self-pay

## 2018-06-28 ENCOUNTER — Ambulatory Visit
Admission: RE | Admit: 2018-06-28 | Discharge: 2018-06-28 | Disposition: A | Discharge status: Home / Self Care | Payer: Medicare Other | Source: Ambulatory Visit | Attending: Radiation Oncology | Admitting: Radiation Oncology

## 2018-06-28 ENCOUNTER — Ambulatory Visit: Payer: Medicare Other

## 2018-06-28 VITALS — BP 155/90 | HR 80 | Temp 97.5°F | Wt 215.9 lb

## 2018-06-28 DIAGNOSIS — C61 Malignant neoplasm of prostate: Secondary | ICD-10-CM

## 2018-06-28 DIAGNOSIS — Z08 Encounter for follow-up examination after completed treatment for malignant neoplasm: Secondary | ICD-10-CM | POA: Diagnosis not present

## 2018-06-28 DIAGNOSIS — M6281 Muscle weakness (generalized): Secondary | ICD-10-CM | POA: Diagnosis not present

## 2018-06-28 DIAGNOSIS — Z923 Personal history of irradiation: Secondary | ICD-10-CM | POA: Diagnosis not present

## 2018-06-28 DIAGNOSIS — R262 Difficulty in walking, not elsewhere classified: Secondary | ICD-10-CM

## 2018-06-28 DIAGNOSIS — R2689 Other abnormalities of gait and mobility: Secondary | ICD-10-CM | POA: Diagnosis not present

## 2018-06-28 DIAGNOSIS — R2681 Unsteadiness on feet: Secondary | ICD-10-CM | POA: Diagnosis not present

## 2018-06-28 DIAGNOSIS — Z8546 Personal history of malignant neoplasm of prostate: Secondary | ICD-10-CM | POA: Diagnosis not present

## 2018-06-28 NOTE — Therapy (Addendum)
[place] MAIN [facility] SERVICES [address] [place], [place], [address] Phone: [phone]   Fax:  [phone]  Physical Therapy Treatment  Patient Details  Name: [name] MRN: [id number] Date of Birth: July 21, 1934 No data recorded  Encounter Date: 06/28/2018  PT End of Session - 06/28/18 1258    Visit Number  8    Number of Visits  16    Date for PT Re-Evaluation  07/13/18    Authorization Type  8/10 PN start 9/17    PT Start Time  1300    PT Stop Time  1344    PT Time Calculation (min)  44 min    Equipment Utilized During Treatment  Gait belt    Activity Tolerance  Patient tolerated treatment well;Patient limited by fatigue    Behavior During Therapy  WFL for tasks assessed/performed       Past Medical History:  Diagnosis Date  . Arthritis   . Atherosclerotic heart disease of native coronary artery without angina pectoris 1998   - s/p CABG, occluded SVG-D1; patent LIMA-LAD, SVG-OM, SVG- RCA  . Basal cell carcinoma of eyelid 01/03/2013   2019 - R side of Nose  . Benign neoplasm of ascending colon   . Benign neoplasm of descending colon   . Cancer [facility])    Skin cancer  . Colon polyps   . COPD (chronic obstructive pulmonary disease) ([place])   . GERD (gastroesophageal reflux disease)   . Hemorrhoid 02/10/2015  . History of hiatal hernia   . HOH (hard of hearing)   . Hypertension   . Memory loss   . Osteoporosis   . PAF (paroxysmal atrial fibrillation) ([place])   . Prostate disorder   . Sciatic pain    Chronic  . Sleep apnea    cpap  . Temporary cerebral vascular dysfunction 02/10/2015   Had negative work-up.  July, 2012.  No medication changes, had forgot them that day, got dehydrated.     Past Surgical History:  Procedure Laterality Date  . ANKLE SURGERY    . Arch aortogram and carotid aortogram  06/02/2006   Dr Oneida Alar did surgery  . BASAL CELL CARCINOMA EXCISION  04/2016   Dermatology  . CARDIAC CATHETERIZATION   01/30/1998    (Dr. Linard Millers): Native LAD & mRCA CTO. 90% D1. Mod Cx. SVG-D1 CTO. SVG-RCA & SVG-OM along with LIMA-LAD patent;  LV NORMAL Fxn  . CAROTID ENDARTERECTOMY Left Oct. 29, 2007   Dr. Oneida Alar:  . CATARACT EXTRACTION W/PHACO Right 03/05/2017   Procedure: CATARACT EXTRACTION PHACO AND INTRAOCULAR LENS PLACEMENT (IOC);  Surgeon: Leandrew Koyanagi, MD;  Location: [facility] ORS;  Service: Ophthalmology;  Laterality: Right;  [place] 00:58.5 AP% 10.6 CDE 6.20 Fluid Pack lot # [id number]  . CATARACT EXTRACTION W/PHACO Left 04/15/2017   Procedure: CATARACT EXTRACTION PHACO AND INTRAOCULAR LENS PLACEMENT ([place])  Left;  Surgeon: Leandrew Koyanagi, MD;  Location: [place];  Service: Ophthalmology;  Laterality: Left;  . COLONOSCOPY WITH PROPOFOL N/A 09/15/2017   Procedure: COLONOSCOPY WITH PROPOFOL;  Surgeon: Lucilla Lame, MD;  Location: [facility] ENDOSCOPY;  Service: Endoscopy;  Laterality: N/A;  . CORONARY ARTERY BYPASS GRAFT  1998   LIMA-LAD, SVG-OM, SVG-RPDA, SVG-DIAG  . CPET / MET  12/2012   Mild chronotropic incompetence - read 82% of predicted; also reduced effort; peak VO2 15.7 / 75% (did not reach Max effort) -- suggested ischemic response in last 1.5 minutes of exercise. Normal pulmonary function on PFTs but poor response  to  . DOPPLER ECHOCARDIOGRAPHY  11/20/2010   EF =>55%; LV norm mild aortic scelorosis  . ESOPHAGOGASTRODUODENOSCOPY (EGD) WITH PROPOFOL N/A 09/15/2017   Procedure: ESOPHAGOGASTRODUODENOSCOPY (EGD) WITH PROPOFOL;  Surgeon: Lucilla Lame, MD;  Location: [facility] ENDOSCOPY;  Service: Endoscopy;  Laterality: N/A;  . FRACTURE SURGERY Right 03/19/2015   wrist  . HIATAL HERNIA REPAIR    . ILIAC ARTERY STENT  12/15/2005   PTA and direct stenting rgt and lft common iliac arteries  . NM GATED MYOVIEW ([place] HX)  12/2017   Low Risk - no ischemia or infarct.  EF > 65%. no RWMA  . NM MYOCAR PERF WALL MOTION  11/11/2010; February 2016   a) TM STRESS----NORMAL PERFUSION ,EF 68%; b) EF 50-55%.  Normal LV function. No significant ischemia or infarction.  . ORIF ELBOW FRACTURE Right 01/01/2017   Procedure: OPEN REDUCTION INTERNAL FIXATION (ORIF) ELBOW/OLECRANON FRACTURE;  Surgeon: Hessie Knows, MD;  Location: [facility] ORS;  Service: Orthopedics;  Laterality: Right;  . ORIF WRIST FRACTURE Right 03/19/2015   Procedure: OPEN REDUCTION INTERNAL FIXATION (ORIF) WRIST FRACTURE;  Surgeon: Hessie Knows, MD;  Location: [facility] ORS;  Service: Orthopedics;  Laterality: Right;  . THORACIC AORTA - CAROTID ANGIOGRAM  October 2007   Dr. 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 TRANSTHORACIC ECHOCARDIOGRAM  February 2016   [facility]: Normal LV function. Dilated left atrium.    There were no vitals filed for this visit.  Subjective Assessment - 06/28/18 1302    Subjective  Patient states he is moving into a rest home on Wednesday. States it may be permanent location but depends on how he likes it. Patient states he fell this morning. States he tripped over the sheet. He denies being hurt. Reports he had follow up with doctor regarding cancer treatments and everything went well. Reports HEP compliance.     Patient is accompained by:  Family member    Pertinent History  Patient goes 5x/week for radiation therapy for prostate cancer. Is getting weaker and no longer goes to the senior center. Just started using cane, when walks a lot his low back and legs ache. He has a significant CV history including quadruple bypass, CAD w cardiac stents, on Eliquis, left carotid endarterectomy, AAA. He is functionally independent but lives alone, his partner lives 5 minutes down the road and seems to assist him with daily living. Activity as tolerated per patient report. Has history of memory deficits.     Limitations  Walking;Standing;House hold activities;Other (comment);Lifting    How long can you sit comfortably?  n/a    How long can you stand  comfortably?  needs to hold onto cane.     How long can you walk comfortably?  40 ft     Patient Stated Goals  improve stability    Currently in Pain?  No/denies    Pain Onset  In the past 7 days          Nustep lvl 3 4 minutes >60rpm for cardiovascular challenge  Seated: Seated adduction ball squeezes x10 each leg 3 second holds.     Seated LAQ x10 with 3 second holds. Verbal cues for alignment. 2lb weights   Seated gluteal squeezes without back support. x10 3 second holds  Seated abduction with RTB x10 3 second holds. Verbal cues to maintain contraction  Seated upright raises with 2000Gr ball for core activation and strength x10   Standing:  Sit to  stands x10. Faded from no UE support to requring 1UE support for last 2 reps. CGA  Standing lateral step over orange hurdle x10 each LE. BUE support. CGA. Verbal cues for upright posture.   Standing step over and back orange hurdle x10 each LE. BUE support. CGA     Standing marches x10 each LE 1 UE support. CGA. Verbal cues to increase step height   Side stepping x3 lengths of bars. BUE. CGA. Verbal cues to maintain neutral feet alignment.   Tandem in //bars without UE support. 2x30 seconds each leg. Needed occasional use of hands to maintain stability. CGA  Standing airex pad toe taps with 6in step in //bars. CGA and 1 UE support. Verbal cues to widen BOS and increase step height.   Standing airex pad lateral toe taps with 6in step. x10 each LE. Verbal cues to increase step height  Standing balloon taps in and out of BOS. CGA. 3 minutes    Exercises were balance between seated and standing due to fatigue   Patient required verbal cues for body mechanics and CGA .                          PT Education - 06/28/18 1258    Education provided  Yes    Education Details  exercise technique, HEP    Person(s) Educated  Patient    Methods  Explanation;Demonstration;Verbal cues    Comprehension  Verbalized  understanding;Returned demonstration;Need further instruction       PT Short Term Goals - 05/18/18 2800      PT SHORT TERM GOAL #1   Title  Patient will be independent in home exercise program to improve strength/mobility for better functional independence with ADLs.    Baseline  9/17: HEP given     Time  2    Period  Weeks    Status  New    Target Date  06/01/18      PT SHORT TERM GOAL #2   Title  Patient will deny any falls over past 2 weeks to demonstrate improved safety awareness at home and work.     Baseline  9/17: progressive weakness    Time  2    Period  Weeks    Status  New    Target Date  06/01/18      PT SHORT TERM GOAL #3   Title  Patient will increase BLE gross strength to 4-/5 as to improve functional strength for independent gait, increased standing tolerance and increased ADL ability.    Baseline  9/17: Hip flex: L 3/5 R 3+/5 abd: 2+/5, 3-/5; add 2/5 2+/5    Time  2    Period  Weeks    Status  New    Target Date  06/01/18        PT Long Term Goals - 05/19/18 1623      PT LONG TERM GOAL #1   Title  Patient will increase BLE gross strength to 4+/5 as to improve functional strength for independent gait, increased standing tolerance and increased ADL ability.    Baseline  9/17: Hip flex: L 3/5 R 3+/5 abd: 2+/5, 3-/5; add 2/5 2+/5    Time  8    Period  Weeks    Status  New      PT LONG TERM GOAL #2   Title  Patient will increase ABC scale score >80% to demonstrate better functional mobility and better confidence with  ADLs.     Baseline  9/17: 68.13%    Time  8    Period  Weeks    Status  New      PT LONG TERM GOAL #3   Title  Patient will increase Berg Balance score by > 6 points (44/56)  to demonstrate decreased fall risk during functional activities.    Baseline  9/17: 38/56    Time  8    Period  Weeks    Status  New      PT LONG TERM GOAL #4   Title  Patient will increase six minute walk test distance to >1000 for progression to community  ambulator and improve gait ability    Baseline  9/18: 1300 ft w/o cane    Time  8    Period  Weeks    Status  New            Plan - 06/28/18 1443    Clinical Impression Statement  Patient had difficulty with lateral step overs due to attempting to step over hurdle too quickly leading to decreased foot clearance.Patient was able to complete tandem stance with increased instability with nearing end of exercise likely due to decreased muscle endurance. Sit to stands remain difficult due to LE muscle weakness and endurance requiring use of UE with increased repetition. Patient will continue to benefit from skilled physical therapy to improve strength, balance, and functional mobility.     Rehab Potential  Fair    Clinical Impairments Affecting Rehab Potential  (+) good support system, high prior level of function (-) currently undergoing radiation therapy for prostate cancer, memory deficits     PT Frequency  2x / week    PT Duration  8 weeks    PT Treatment/Interventions  ADLs/Self Care Home Management;Aquatic Therapy;Cryotherapy;Moist Heat;Traction;Ultrasound;Therapeutic activities;Functional mobility training;Stair training;Gait training;DME Instruction;Therapeutic exercise;Balance training;Neuromuscular re-education;Patient/family education;Manual techniques;Passive range of motion;Energy conservation;Vestibular;Taping    PT Next Visit Plan  strength, balance     PT Home Exercise Plan  see above    Consulted and Agree with Plan of Care  Patient       Patient will benefit from skilled therapeutic intervention in order to improve the following deficits and impairments:  Abnormal gait, Cardiopulmonary status limiting activity, Decreased activity tolerance, Decreased balance, Decreased knowledge of precautions, Decreased endurance, Decreased coordination, Decreased cognition, Decreased knowledge of use of DME, Decreased mobility, Difficulty walking, Decreased safety awareness, Decreased  strength, Impaired flexibility, Impaired perceived functional ability, Impaired sensation, Postural dysfunction, Improper body mechanics, Pain  Visit Diagnosis: Unsteadiness on feet  Muscle weakness (generalized)  Other abnormalities of gait and mobility  Difficulty in walking, not elsewhere classified     Problem List Patient Active Problem List   Diagnosis Date Noted  . Prostate cancer ([place]) 02/25/2018  . Prostatic cyst 01/15/2018  . 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 ([place]) 09/15/2017  . Blood in stool   . Abnormal feces   . Stricture and stenosis of esophagus   . Mild cognitive impairment 08/18/2017  . Senile purpura ([place]) 04/29/2017  . Anemia 04/29/2017  . Frequent falls 04/29/2017  . Benign localized hyperplasia of prostate with urinary obstruction 07/15/2016  . Elevated PSA 07/15/2016  . Incomplete emptying of bladder 07/15/2016  . Urge incontinence 07/15/2016  . Hypothyroidism 04/25/2016  . Macular degeneration 04/25/2016  . Erectile dysfunction due to arterial insufficiency   . Ischemic heart disease with chronotropic incompetence   . CN (constipation) 02/10/2015  . DD (diverticular disease) 02/10/2015  .  Fatigue 02/10/2015  . Lichen planus 59/29/2446  . Restless leg 02/10/2015  . 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 B12 deficiency 02/10/2015  . COPD (chronic obstructive pulmonary disease) ([place]) 11/14/2014  . Post Inflammatory Lung Changes 11/14/2014  . PAF (paroxysmal atrial fibrillation) ([facility]) CHA2DS2-VASc = 4. AC = Eliquis   . Insomnia 12/09/2013  . OSA on CPAP   . Dyspnea on exertion - -essentially resolved with BB dose reduction & wgt loss 03/29/2013  . Overweight (BMI 25.0-29.9) -- Wgt back up 01/17/2013  . Atherosclerotic heart disease of native coronary artery without angina pectoris - s/p CABG, occluded SVG-D1; patent LIMA-LAD, SVG-OM, SVG- RCA   . Essential hypertension   . Hyperlipidemia with target LDL less than 70   . Aorto-iliac disease ([place])    . BCC (basal cell carcinoma), eyelid 01/03/2013  . Allergic rhinitis 08/17/2009  . Adaptation reaction 05/28/2009  . Acid reflux 05/28/2009  . Arthritis, degenerative 05/28/2009  . 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 Carotid artery occlusion without infarction 06/01/2006  . PAD (peripheral artery disease) - bilateral common iliac stents 12/15/2005  . Aortoiliac occlusive disease ([place]) 11/30/2005  . Atherosclerotic heart disease of artery bypass graft 09/01/1997   Erick Blinks, SPT  This entire session was performed under direct supervision and direction of a licensed therapist/therapist assistant . I have personally read, edited and approve of the note as written.  Janna Arch, PT, DPT   06/28/2018, 4:22 PM  [place] MAIN [facility] SERVICES [address] [place], [place], [address] Phone: [phone]   Fax:  [phone]  Name: [name] MRN: [id number] Date of Birth: [birth year]

## 2018-06-28 NOTE — Progress Notes (Signed)
Radiation Oncology Follow up Note  Name: [name]   Date:   06/28/2018 MRN:  [id number] DOB: 10/11/1933    This 84 y.o. male presents to the clinic today for one-month follow-up status post IMRT radiation therapy to his prostate  for Gleason 8 adenocarcinoma the prostate.  REFERRING PROVIDER: [name], MD  HPI: Patient is a 84 year old male now about 1 month having completed IMRT radiation therapy to his prostate  for Gleason 8 adenocarcinoma the prostate.  He is seen today in routine follow-up 1 month out and is doing well.  He does have some nocturia x2 frequency and urgency although that is been common and he is on Flomax.  He also has minimal intermittent diarrhea although has not been taking any Imodium.[name]  He has been on Lupron suppression and we will make sure that is followed up by urology.  He is here today accompanied by his son states he is doing well.  COMPLICATIONS OF TREATMENT: none  FOLLOW UP COMPLIANCE: keeps appointments   PHYSICAL EXAM:  There were no vitals taken for this visit. Well-developed well-nourished patient in NAD. HEENT reveals PERLA, EOMI, discs not visualized.  Oral cavity is clear. No oral mucosal lesions are identified. Neck is clear without evidence of cervical or supraclavicular adenopathy. Lungs are clear to A&P. Cardiac examination is essentially unremarkable with regular rate and rhythm without murmur rub or thrill. Abdomen is benign with no organomegaly or masses noted. Motor sensory and DTR levels are equal and symmetric in the upper and lower extremities. Cranial nerves II through XII are grossly intact. Proprioception is intact. No peripheral adenopathy or edema is identified. No motor or sensory levels are noted. Crude visual fields are within normal range.  RADIOLOGY RESULTS: No current films for review  PLAN: Present time patient is doing fairly well.  We have again gone over low residue diet and Imodium use.  His urinary symptoms  seem to be stable at this time.  I have asked to see him back in 3 months for follow-up with a PSA prior to that visit.  We will also contact urology to make sure he is up-to-date on his Lupron suppression.  Patient and son both comprehend my treatment plan well.  I would like to take this opportunity to thank you for allowing me to participate in the care of your patient.Noreene Filbert, MD

## 2018-06-30 ENCOUNTER — Ambulatory Visit: Payer: Medicare Other

## 2018-06-30 DIAGNOSIS — R2689 Other abnormalities of gait and mobility: Secondary | ICD-10-CM | POA: Diagnosis not present

## 2018-06-30 DIAGNOSIS — M6281 Muscle weakness (generalized): Secondary | ICD-10-CM | POA: Diagnosis not present

## 2018-06-30 DIAGNOSIS — R2681 Unsteadiness on feet: Secondary | ICD-10-CM

## 2018-06-30 DIAGNOSIS — R262 Difficulty in walking, not elsewhere classified: Secondary | ICD-10-CM

## 2018-06-30 NOTE — Therapy (Addendum)
[place] MAIN [facility] SERVICES [address] [place], [place], [address] Phone: [phone]   Fax:  [phone]  Physical Therapy Treatment  Patient Details  Name: [name] MRN: [id number] Date of Birth: 1933/11/18 No data recorded  Encounter Date: 06/30/2018  PT End of Session - 06/30/18 1446    Visit Number  9    Number of Visits  16    Date for PT Re-Evaluation  07/13/18    Authorization Type  9/10 PN start 9/17    PT Start Time  1314    PT Stop Time  1344    PT Time Calculation (min)  30 min    Equipment Utilized During Treatment  Gait belt    Activity Tolerance  Patient tolerated treatment well;Patient limited by fatigue    Behavior During Therapy  [facility] for tasks assessed/performed       Past Medical History:  Diagnosis Date  . Arthritis   . Atherosclerotic heart disease of native coronary artery without angina pectoris 1998   - s/p CABG, occluded SVG-D1; patent LIMA-LAD, SVG-OM, SVG- RCA  . Basal cell carcinoma of eyelid 01/03/2013   2019 - R side of Nose  . Benign neoplasm of ascending colon   . Benign neoplasm of descending colon   . Cancer [facility])    Skin cancer  . Colon polyps   . COPD (chronic obstructive pulmonary disease) ([place])   . GERD (gastroesophageal reflux disease)   . Hemorrhoid 02/10/2015  . History of hiatal hernia   . HOH (hard of hearing)   . Hypertension   . Memory loss   . Osteoporosis   . PAF (paroxysmal atrial fibrillation) ([place])   . Prostate disorder   . Sciatic pain    Chronic  . Sleep apnea    cpap  . Temporary cerebral vascular dysfunction 02/10/2015   Had negative work-up.  July, 2012.  No medication changes, had forgot them that day, got dehydrated.     Past Surgical History:  Procedure Laterality Date  . ANKLE SURGERY    . Arch aortogram and carotid aortogram  06/02/2006   Dr Oneida Alar did surgery  . BASAL CELL CARCINOMA EXCISION  04/2016   Dermatology  . CARDIAC CATHETERIZATION   01/30/1998    (Dr. Linard Millers): Native LAD & mRCA CTO. 90% D1. Mod Cx. SVG-D1 CTO. SVG-RCA & SVG-OM along with LIMA-LAD patent;  LV NORMAL Fxn  . CAROTID ENDARTERECTOMY Left Oct. 29, 2007   Dr. Oneida Alar:  . CATARACT EXTRACTION W/PHACO Right 03/05/2017   Procedure: CATARACT EXTRACTION PHACO AND INTRAOCULAR LENS PLACEMENT (IOC);  Surgeon: Leandrew Koyanagi, MD;  Location: [facility] ORS;  Service: Ophthalmology;  Laterality: Right;  [place] 00:58.5 AP% 10.6 CDE 6.20 Fluid Pack lot # [id number]  . CATARACT EXTRACTION W/PHACO Left 04/15/2017   Procedure: CATARACT EXTRACTION PHACO AND INTRAOCULAR LENS PLACEMENT ([place])  Left;  Surgeon: Leandrew Koyanagi, MD;  Location: [place];  Service: Ophthalmology;  Laterality: Left;  . COLONOSCOPY WITH PROPOFOL N/A 09/15/2017   Procedure: COLONOSCOPY WITH PROPOFOL;  Surgeon: Lucilla Lame, MD;  Location: [facility] ENDOSCOPY;  Service: Endoscopy;  Laterality: N/A;  . CORONARY ARTERY BYPASS GRAFT  1998   LIMA-LAD, SVG-OM, SVG-RPDA, SVG-DIAG  . CPET / MET  12/2012   Mild chronotropic incompetence - read 82% of predicted; also reduced effort; peak VO2 15.7 / 75% (did not reach Max effort) -- suggested ischemic response in last 1.5 minutes of exercise. Normal pulmonary function on PFTs but poor response  to  . DOPPLER ECHOCARDIOGRAPHY  11/20/2010   EF =>55%; LV norm mild aortic scelorosis  . ESOPHAGOGASTRODUODENOSCOPY (EGD) WITH PROPOFOL N/A 09/15/2017   Procedure: ESOPHAGOGASTRODUODENOSCOPY (EGD) WITH PROPOFOL;  Surgeon: Lucilla Lame, MD;  Location: [facility] ENDOSCOPY;  Service: Endoscopy;  Laterality: N/A;  . FRACTURE SURGERY Right 03/19/2015   wrist  . HIATAL HERNIA REPAIR    . ILIAC ARTERY STENT  12/15/2005   PTA and direct stenting rgt and lft common iliac arteries  . NM GATED MYOVIEW ([place] HX)  12/2017   Low Risk - no ischemia or infarct.  EF > 65%. no RWMA  . NM MYOCAR PERF WALL MOTION  11/11/2010; February 2016   a) TM STRESS----NORMAL PERFUSION ,EF 68%; b) EF 50-55%.  Normal LV function. No significant ischemia or infarction.  . ORIF ELBOW FRACTURE Right 01/01/2017   Procedure: OPEN REDUCTION INTERNAL FIXATION (ORIF) ELBOW/OLECRANON FRACTURE;  Surgeon: Hessie Knows, MD;  Location: [facility] ORS;  Service: Orthopedics;  Laterality: Right;  . ORIF WRIST FRACTURE Right 03/19/2015   Procedure: OPEN REDUCTION INTERNAL FIXATION (ORIF) WRIST FRACTURE;  Surgeon: Hessie Knows, MD;  Location: [facility] ORS;  Service: Orthopedics;  Laterality: Right;  . THORACIC AORTA - CAROTID ANGIOGRAM  October 2007   Dr. 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 TRANSTHORACIC ECHOCARDIOGRAM  February 2016   [facility]: Normal LV function. Dilated left atrium.    There were no vitals filed for this visit.  Subjective Assessment - 06/30/18 1318    Subjective  Patient arrived 29mnutes late due to lunch being served slow and late at assisted living facility. Patient states he is now moved into assisted living facility ([facility]. States he did not sleep well last night which was first night at [facility] place.  Reports no falls since last visit. Reports HEP compliance.     Patient is accompained by:  Family member    Pertinent History  Patient goes 5x/week for radiation therapy for prostate cancer. Is getting weaker and no longer goes to the senior center. Just started using cane, when walks a lot his low back and legs ache. He has a significant CV history including quadruple bypass, CAD w cardiac stents, on Eliquis, left carotid endarterectomy, AAA. He is functionally independent but lives alone, his partner lives 5 minutes down the road and seems to assist him with daily living. Activity as tolerated per patient report. Has history of memory deficits.     Limitations  Walking;Standing;House hold activities;Other (comment);Lifting    How long can you sit comfortably?  n/a    How long can you stand comfortably?  needs to hold  onto cane.     How long can you walk comfortably?  40 ft     Patient Stated Goals  improve stability    Currently in Pain?  No/denies    Pain Onset  In the past 7 days          Nustep lvl 3 4 minutes >60rpm for cardiovascular challenge   Seated     Seated gluteal squeezes without back support. x10 3 second holds   Seated upright raises with 2000Gr ball for core activation and strength x10   Standing:   Sit to stands x10. 1UE support. CGA  Standing step up and back down on 4in step. BUE support and CGA. Verbal cues to step down one foot at a time and widen BOS.    Standing airex pad toe taps with  4in step in //bars. CGA and 1 UE support. Verbal cues to widen BOS and increase step height.    Standing ball toss on airex pad in and out of BOS. CGA. 3 minutes   Standing toe raises x10 BUE CGA  Standing heel raises x10 BUE CGA. Verbal cues to not lean forward.     Exercises were balance between seated and standing due to fatigue   Patient required verbal cues for body mechanics and CGA .                         PT Education - 06/30/18 1320    Education provided  Yes    Education Details  exercise technique, HEP, transfer of care to assisted living facility     Person(s) Educated  Patient;Child(ren)    Methods  Explanation;Demonstration;Verbal cues    Comprehension  Verbalized understanding;Returned demonstration;Need further instruction       PT Short Term Goals - 05/18/18 4496      PT SHORT TERM GOAL #1   Title  Patient will be independent in home exercise program to improve strength/mobility for better functional independence with ADLs.    Baseline  9/17: HEP given     Time  2    Period  Weeks    Status  New    Target Date  06/01/18      PT SHORT TERM GOAL #2   Title  Patient will deny any falls over past 2 weeks to demonstrate improved safety awareness at home and work.     Baseline  9/17: progressive weakness    Time  2    Period  Weeks     Status  New    Target Date  06/01/18      PT SHORT TERM GOAL #3   Title  Patient will increase BLE gross strength to 4-/5 as to improve functional strength for independent gait, increased standing tolerance and increased ADL ability.    Baseline  9/17: Hip flex: L 3/5 R 3+/5 abd: 2+/5, 3-/5; add 2/5 2+/5    Time  2    Period  Weeks    Status  New    Target Date  06/01/18        PT Long Term Goals - 05/19/18 1623      PT LONG TERM GOAL #1   Title  Patient will increase BLE gross strength to 4+/5 as to improve functional strength for independent gait, increased standing tolerance and increased ADL ability.    Baseline  9/17: Hip flex: L 3/5 R 3+/5 abd: 2+/5, 3-/5; add 2/5 2+/5    Time  8    Period  Weeks    Status  New      PT LONG TERM GOAL #2   Title  Patient will increase ABC scale score >80% to demonstrate better functional mobility and better confidence with ADLs.     Baseline  9/17: 68.13%    Time  8    Period  Weeks    Status  New      PT LONG TERM GOAL #3   Title  Patient will increase Berg Balance score by > 6 points (44/56)  to demonstrate decreased fall risk during functional activities.    Baseline  9/17: 38/56    Time  8    Period  Weeks    Status  New      PT LONG TERM GOAL #4   Title  Patient will increase six minute walk test distance to >1000 for progression to community ambulator and improve gait ability    Baseline  9/18: 1300 ft w/o cane    Time  8    Period  Weeks    Status  New            Plan - 06/30/18 1445    Clinical Impression Statement  Patient arrived 15 minutes late to therapy session. Patient was able to successfully completed 10 reps of sit to stands at end of session without using BUE demonstrating improved LE strength. Progressed patient to standing ball toss on airex pad to further challenge balance. Discussed transfer of therapy to assisted living facility with patient and son for ease of transportation for patient and family.  Patient will continue to benefit from skilled physical therapy to improve strength, balance, and functional mobility.     Rehab Potential  Fair    Clinical Impairments Affecting Rehab Potential  (+) good support system, high prior level of function (-) currently undergoing radiation therapy for prostate cancer, memory deficits     PT Frequency  2x / week    PT Duration  8 weeks    PT Treatment/Interventions  ADLs/Self Care Home Management;Aquatic Therapy;Cryotherapy;Moist Heat;Traction;Ultrasound;Therapeutic activities;Functional mobility training;Stair training;Gait training;DME Instruction;Therapeutic exercise;Balance training;Neuromuscular re-education;Patient/family education;Manual techniques;Passive range of motion;Energy conservation;Vestibular;Taping    PT Next Visit Plan  strength, balance     PT Home Exercise Plan  see above    Consulted and Agree with Plan of Care  Patient       Patient will benefit from skilled therapeutic intervention in order to improve the following deficits and impairments:  Abnormal gait, Cardiopulmonary status limiting activity, Decreased activity tolerance, Decreased balance, Decreased knowledge of precautions, Decreased endurance, Decreased coordination, Decreased cognition, Decreased knowledge of use of DME, Decreased mobility, Difficulty walking, Decreased safety awareness, Decreased strength, Impaired flexibility, Impaired perceived functional ability, Impaired sensation, Postural dysfunction, Improper body mechanics, Pain  Visit Diagnosis: Unsteadiness on feet  Muscle weakness (generalized)  Other abnormalities of gait and mobility  Difficulty in walking, not elsewhere classified     Problem List Patient Active Problem List   Diagnosis Date Noted  . Prostate cancer ([place]) 02/25/2018  . Prostatic cyst 01/15/2018  . 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 ([place]) 09/15/2017  . Blood in stool   . Abnormal feces   . Stricture and stenosis of esophagus   .  Mild cognitive impairment 08/18/2017  . Senile purpura ([place]) 04/29/2017  . Anemia 04/29/2017  . Frequent falls 04/29/2017  . Benign localized hyperplasia of prostate with urinary obstruction 07/15/2016  . Elevated PSA 07/15/2016  . Incomplete emptying of bladder 07/15/2016  . Urge incontinence 07/15/2016  . Hypothyroidism 04/25/2016  . Macular degeneration 04/25/2016  . Erectile dysfunction due to arterial insufficiency   . Ischemic heart disease with chronotropic incompetence   . CN (constipation) 02/10/2015  . DD (diverticular disease) 02/10/2015  . Fatigue 02/10/2015  . Lichen planus 16/06/9603  . Restless leg 02/10/2015  . 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 B12 deficiency 02/10/2015  . COPD (chronic obstructive pulmonary disease) ([place]) 11/14/2014  . Post Inflammatory Lung Changes 11/14/2014  . PAF (paroxysmal atrial fibrillation) ([facility]) CHA2DS2-VASc = 4. AC = Eliquis   . Insomnia 12/09/2013  . OSA on CPAP   . Dyspnea on exertion - -essentially resolved with BB dose reduction & wgt loss 03/29/2013  . Overweight (BMI 25.0-29.9) -- Wgt back up 01/17/2013  . Atherosclerotic heart disease of native  coronary artery without angina pectoris - s/p CABG, occluded SVG-D1; patent LIMA-LAD, SVG-OM, SVG- RCA   . Essential hypertension   . Hyperlipidemia with target LDL less than 70   . Aorto-iliac disease ([place])   . BCC (basal cell carcinoma), eyelid 01/03/2013  . Allergic rhinitis 08/17/2009  . Adaptation reaction 05/28/2009  . Acid reflux 05/28/2009  . Arthritis, degenerative 05/28/2009  . 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 Carotid artery occlusion without infarction 06/01/2006  . PAD (peripheral artery disease) - bilateral common iliac stents 12/15/2005  . Aortoiliac occlusive disease ([place]) 11/30/2005  . Atherosclerotic heart disease of artery bypass graft 09/01/1997   Erick Blinks, SPT  This entire session was performed under direct supervision and direction of a  licensed therapist/therapist assistant . I have personally read, edited and approve of the note as written.  Janna Arch, PT, DPT   06/30/2018, 3:33 PM  [place] MAIN [facility] SERVICES [address] [place], [place], [address] Phone: [phone]   Fax:  [phone]  Name: [name] MRN: [id number] Date of Birth: 01-14-1934

## 2018-07-05 ENCOUNTER — Ambulatory Visit: Payer: Medicare Other | Attending: Radiation Oncology

## 2018-07-05 DIAGNOSIS — R262 Difficulty in walking, not elsewhere classified: Secondary | ICD-10-CM | POA: Diagnosis not present

## 2018-07-05 DIAGNOSIS — R2689 Other abnormalities of gait and mobility: Secondary | ICD-10-CM | POA: Diagnosis not present

## 2018-07-05 DIAGNOSIS — M6281 Muscle weakness (generalized): Secondary | ICD-10-CM | POA: Diagnosis not present

## 2018-07-05 DIAGNOSIS — R2681 Unsteadiness on feet: Secondary | ICD-10-CM | POA: Diagnosis not present

## 2018-07-05 NOTE — Therapy (Addendum)
[place] MAIN [facility] SERVICES [address] [place], [place], [address] Phone: [phone]   Fax:  [phone]  Physical Therapy Treatment Physical Therapy Progress Note   Dates of reporting period  05/19/18   to   07/05/18   Patient Details  Name: [name] MRN: [id number] Date of Birth: Jun 19, 1934 No data recorded  Encounter Date: 07/05/2018  PT End of Session - 07/05/18 1443    Visit Number  10    Number of Visits  32    Date for PT Re-Evaluation  08/30/18    Authorization Type  10/10 PN start 9/17 (next PN 1/10 starts 11/4)    PT Start Time  1300    PT Stop Time  1344    PT Time Calculation (min)  44 min    Equipment Utilized During Treatment  Gait belt    Activity Tolerance  Patient tolerated treatment well;Patient limited by fatigue    Behavior During Therapy  [facility] for tasks assessed/performed       Past Medical History:  Diagnosis Date  . Arthritis   . Atherosclerotic heart disease of native coronary artery without angina pectoris 1998   - s/p CABG, occluded SVG-D1; patent LIMA-LAD, SVG-OM, SVG- RCA  . Basal cell carcinoma of eyelid 01/03/2013   2019 - R side of Nose  . Benign neoplasm of ascending colon   . Benign neoplasm of descending colon   . Cancer [facility])    Skin cancer  . Colon polyps   . COPD (chronic obstructive pulmonary disease) ([place])   . GERD (gastroesophageal reflux disease)   . Hemorrhoid 02/10/2015  . History of hiatal hernia   . HOH (hard of hearing)   . Hypertension   . Memory loss   . Osteoporosis   . PAF (paroxysmal atrial fibrillation) ([place])   . Prostate disorder   . Sciatic pain    Chronic  . Sleep apnea    cpap  . Temporary cerebral vascular dysfunction 02/10/2015   Had negative work-up.  July, 2012.  No medication changes, had forgot them that day, got dehydrated.     Past Surgical History:  Procedure Laterality Date  . ANKLE SURGERY    . Arch aortogram and carotid aortogram   06/02/2006   Dr Oneida Alar did surgery  . BASAL CELL CARCINOMA EXCISION  04/2016   Dermatology  . CARDIAC CATHETERIZATION  01/30/1998    (Dr. Linard Millers): Native LAD & mRCA CTO. 90% D1. Mod Cx. SVG-D1 CTO. SVG-RCA & SVG-OM along with LIMA-LAD patent;  LV NORMAL Fxn  . CAROTID ENDARTERECTOMY Left Oct. 29, 2007   Dr. Oneida Alar:  . CATARACT EXTRACTION W/PHACO Right 03/05/2017   Procedure: CATARACT EXTRACTION PHACO AND INTRAOCULAR LENS PLACEMENT (IOC);  Surgeon: Leandrew Koyanagi, MD;  Location: [facility] ORS;  Service: Ophthalmology;  Laterality: Right;  [place] 00:58.5 AP% 10.6 CDE 6.20 Fluid Pack lot # [id number]  . CATARACT EXTRACTION W/PHACO Left 04/15/2017   Procedure: CATARACT EXTRACTION PHACO AND INTRAOCULAR LENS PLACEMENT ([place])  Left;  Surgeon: Leandrew Koyanagi, MD;  Location: [place];  Service: Ophthalmology;  Laterality: Left;  . COLONOSCOPY WITH PROPOFOL N/A 09/15/2017   Procedure: COLONOSCOPY WITH PROPOFOL;  Surgeon: Lucilla Lame, MD;  Location: [facility] ENDOSCOPY;  Service: Endoscopy;  Laterality: N/A;  . CORONARY ARTERY BYPASS GRAFT  1998   LIMA-LAD, SVG-OM, SVG-RPDA, SVG-DIAG  . CPET / MET  12/2012   Mild chronotropic incompetence - read 82% of predicted; also reduced effort; peak VO2 15.7 /  75% (did not reach Max effort) -- suggested ischemic response in last 1.5 minutes of exercise. Normal pulmonary function on PFTs but poor response to  . DOPPLER ECHOCARDIOGRAPHY  11/20/2010   EF =>55%; LV norm mild aortic scelorosis  . ESOPHAGOGASTRODUODENOSCOPY (EGD) WITH PROPOFOL N/A 09/15/2017   Procedure: ESOPHAGOGASTRODUODENOSCOPY (EGD) WITH PROPOFOL;  Surgeon: Lucilla Lame, MD;  Location: [facility] ENDOSCOPY;  Service: Endoscopy;  Laterality: N/A;  . FRACTURE SURGERY Right 03/19/2015   wrist  . HIATAL HERNIA REPAIR    . ILIAC ARTERY STENT  12/15/2005   PTA and direct stenting rgt and lft common iliac arteries  . NM GATED MYOVIEW ([place] [facility])  12/2017   Low Risk - no ischemia or infarct.  EF > 65%. no  RWMA  . NM MYOCAR PERF WALL MOTION  11/11/2010; February 2016   a) TM STRESS----NORMAL PERFUSION ,EF 68%; b) EF 50-55%. Normal LV function. No significant ischemia or infarction.  . ORIF ELBOW FRACTURE Right 01/01/2017   Procedure: OPEN REDUCTION INTERNAL FIXATION (ORIF) ELBOW/OLECRANON FRACTURE;  Surgeon: Hessie Knows, MD;  Location: [facility] ORS;  Service: Orthopedics;  Laterality: Right;  . ORIF WRIST FRACTURE Right 03/19/2015   Procedure: OPEN REDUCTION INTERNAL FIXATION (ORIF) WRIST FRACTURE;  Surgeon: Hessie Knows, MD;  Location: [facility] ORS;  Service: Orthopedics;  Laterality: Right;  . THORACIC AORTA - CAROTID ANGIOGRAM  October 2007   Dr. 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 TRANSTHORACIC ECHOCARDIOGRAM  February 2016   [facility]: Normal LV function. Dilated left atrium.    There were no vitals filed for this visit.  Subjective Assessment - 07/05/18 1303    Subjective  Patient states he is getting adjusted to assisted living facility. Reports he is still thinking about whether or not he is going to transfer care of physical therapy services. Patient reports no falls in last 2 weeks.     Patient is accompained by:  Family member    Pertinent History  Patient goes 5x/week for radiation therapy for prostate cancer. Is getting weaker and no longer goes to the senior center. Just started using cane, when walks a lot his low back and legs ache. He has a significant CV history including quadruple bypass, CAD w cardiac stents, on Eliquis, left carotid endarterectomy, AAA. He is functionally independent but lives alone, his partner lives 5 minutes down the road and seems to assist him with daily living. Activity as tolerated William patient report. Has history of memory deficits.     Limitations  Walking;Standing;House hold activities;Other (comment);Lifting    How long can you sit comfortably?  n/a    How long can you  stand comfortably?  needs to hold onto cane.     How long can you walk comfortably?  40 ft     Patient Stated Goals  improve stability    Currently in Pain?  No/denies    Pain Onset  In the past 7 days        [facility] PT Assessment - 07/05/18 0001      Berg Balance Test   Sit to Stand  Able to stand  independently using hands    Standing Unsupported  Able to stand safely 2 minutes    Sitting with Back Unsupported but Feet Supported on Floor or Stool  Able to sit safely and securely 2 minutes    Stand to Sit  Controls descent by using hands    Transfers  Able to  transfer safely, definite need of hands    Standing Unsupported with Eyes Closed  Able to stand 10 seconds safely    Standing Ubsupported with Feet Together  Able to place feet together independently and stand for 1 minute with supervision    From Standing, Reach Forward with Outstretched Arm  Can reach forward >5 cm safely (2")    From Standing Position, Pick up Object from [place] to pick up shoe, needs supervision    From Standing Position, Turn to Look Behind Over each Shoulder  Looks behind one side only/other side shows less weight shift    Turn 360 Degrees  Able to turn 360 degrees safely but slowly    Standing Unsupported, Alternately Place Feet on Step/Stool  Able to complete 4 steps without aid or supervision    Standing Unsupported, One Foot in [place] to take small step independently and hold 30 seconds    Standing on One Leg  Tries to lift leg/unable to hold 3 seconds but remains standing independently    Total Score  39       PROGRESS NOTE HEP- compliant  Falls 2 weeks- none Strength -hip flexion L 3+/5 R 3+/5 abd 2+/5, 2+/5 add 2+/5, 2+/5 ABC-76.6% BERG- 39/56 63mt- 904fwithout cane, HR 115bpm post test  Nustep lvl 3 4 minutes >60rpm for cardiovascular challenge   Standing Sit to stands x10 with 1 UE support. Verbal cues to sit down slowly. CGA  Step over orange hurdle forward backward x10. CGA.  BUE.  Verbal cues to bring knee towards ceiling  Lateral step over orange hurdle x10. CGA. BUE. Verbal cues for LE alignment and large step to clear hurdle.      Patient's condition has the potential to improve in response to therapy. Maximum improvement is yet to be obtained. The anticipated improvement is attainable and reasonable in a generally predictable time.  Patient reports he is walking more.                       PT Education - 07/05/18 1442    Education provided  Yes    Education Details  exercise technique, HEP, transfer of care, goals    Person(s) Educated  Patient    Methods  Explanation;Demonstration;Verbal cues    Comprehension  Verbalized understanding;Returned demonstration;Need further instruction       PT Short Term Goals - 07/05/18 1332      PT SHORT TERM GOAL #1   Title  Patient will be independent in home exercise program to improve strength/mobility for better functional independence with ADLs.    Baseline  9/17: HEP given 11/4: compliant at this time    Time  2    Period  Weeks    Status  Partially Met      PT SHORT TERM GOAL #2   Title  Patient will deny any falls over past 2 weeks to demonstrate improved safety awareness at home and work.     Baseline  9/17: progressive weakness 11/4: denies falls last 2 weeks    Time  2    Period  Weeks    Status  Achieved      PT SHORT TERM GOAL #3   Title  Patient will increase BLE gross strength to 4-/5 as to improve functional strength for independent gait, increased standing tolerance and increased ADL ability.    Baseline  9/17: Hip flex: L 3/5 R 3+/5 abd: 2+/5, 3-/5; add 2/5  2+/5 11/4: hip flexion L 3+/5 R 3+/5 abd 2+/5, 2+/5 add 2+/5, 2+/5    Time  2    Period  Weeks    Status  Partially Met        PT Long Term Goals - 07/05/18 1444      PT LONG TERM GOAL #1   Title  Patient will increase BLE gross strength to 4+/5 as to improve functional strength for independent gait, increased  standing tolerance and increased ADL ability.    Baseline  9/17: Hip flex: L 3/5 R 3+/5 abd: 2+/5, 3-/5; add 2/5 2+/5 11/4: hip flexion L 3+/5 R 3+/5 abd 2+/5, 2+/5 add 2+/5, 2+/5    Time  8    Period  Weeks    Status  Partially Met    Target Date  08/30/18      PT LONG TERM GOAL #2   Title  Patient will increase ABC scale score >80% to demonstrate better functional mobility and better confidence with ADLs.     Baseline  9/17: 68.13% 11/4: 76.6%    Time  8    Period  Weeks    Status  Partially Met    Target Date  08/30/18      PT LONG TERM GOAL #3   Title  Patient will increase Berg Balance score by > 6 points (44/56)  to demonstrate decreased fall risk during functional activities.    Baseline  9/17: 38/56 11/4: 39/56    Time  8    Period  Weeks    Status  Partially Met    Target Date  08/30/18      PT LONG TERM GOAL #4   Title  Patient will increase six minute walk test distance to >1000 for progression to community ambulator and improve gait ability    Baseline  9/18: 1300 ft w/o cane 11/4: 932f w/o cane    Time  8    Period  Weeks    Status  On-going    Target Date  08/30/18      PT LONG TERM GOAL #5   Title  Patient (> 664years old) will complete five times sit to stand test in < 15 seconds without UE support indicating an increased LE strength and improved balance.    Baseline  11/4: requires 1 UE support    Time  8    Period  Weeks    Status  New    Target Date  08/30/18            Plan - 07/05/18 1506    Clinical Impression Statement  Patient goals were updated today. Patient improved BERG balance score from 38/56 to 39/56 demonstrating slight improvements in balance. Patient has made limited progress towards goals at this moment and will be given trial period. Patient's limited progress towards goals is likely due to increased fatigue and decrease in functional capacity due to completion of radiation treatments since beginning therapy. Patient has also  experienced recent changes with move to assisted living facility and injury to primary caregiver impacting social environment and possibly therapy outcomes and current progress. Patient has demonstrated improved compliance following three week absence from therapy with improved motivation. Patient's condition has the potential to improve in response to therapy. Maximum improvement is yet to be obtained. The anticipated improvement is attainable and reasonable in a generally predictable time.Patient will continue to benefit from skilled physical therapy to improve strength, balance, and functional mobility.    Rehab  Potential  Fair    Clinical Impairments Affecting Rehab Potential  (+) good support system, high prior level of function (-) currently undergoing radiation therapy for prostate cancer, memory deficits     PT Frequency  2x / week    PT Duration  8 weeks    PT Treatment/Interventions  ADLs/Self Care Home Management;Aquatic Therapy;Cryotherapy;Moist Heat;Traction;Ultrasound;Therapeutic activities;Functional mobility training;Stair training;Gait training;DME Instruction;Therapeutic exercise;Balance training;Neuromuscular re-education;Patient/family education;Manual techniques;Passive range of motion;Energy conservation;Vestibular;Taping    PT Next Visit Plan  strength, balance     PT Home Exercise Plan  see above    Consulted and Agree with Plan of Care  Patient       Patient will benefit from skilled therapeutic intervention in order to improve the following deficits and impairments:  Abnormal gait, Cardiopulmonary status limiting activity, Decreased activity tolerance, Decreased balance, Decreased knowledge of precautions, Decreased endurance, Decreased coordination, Decreased cognition, Decreased knowledge of use of DME, Decreased mobility, Difficulty walking, Decreased safety awareness, Decreased strength, Impaired flexibility, Impaired perceived functional ability, Impaired sensation,  Postural dysfunction, Improper body mechanics, Pain  Visit Diagnosis: Unsteadiness on feet  Muscle weakness (generalized)  Other abnormalities of gait and mobility  Difficulty in walking, not elsewhere classified     Problem List Patient Active Problem List   Diagnosis Date Noted  . Prostate cancer ([place]) 02/25/2018  . Prostatic cyst 01/15/2018  . 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 ([place]) 09/15/2017  . Blood in stool   . Abnormal feces   . Stricture and stenosis of esophagus   . Mild cognitive impairment 08/18/2017  . Senile purpura ([place]) 04/29/2017  . Anemia 04/29/2017  . Frequent falls 04/29/2017  . Benign localized hyperplasia of prostate with urinary obstruction 07/15/2016  . Elevated PSA 07/15/2016  . Incomplete emptying of bladder 07/15/2016  . Urge incontinence 07/15/2016  . Hypothyroidism 04/25/2016  . Macular degeneration 04/25/2016  . Erectile dysfunction due to arterial insufficiency   . Ischemic heart disease with chronotropic incompetence   . CN (constipation) 02/10/2015  . DD (diverticular disease) 02/10/2015  . Fatigue 02/10/2015  . Lichen planus 58/83/2549  . Restless leg 02/10/2015  . 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 B12 deficiency 02/10/2015  . COPD (chronic obstructive pulmonary disease) ([place]) 11/14/2014  . Post Inflammatory Lung Changes 11/14/2014  . PAF (paroxysmal atrial fibrillation) ([facility]) CHA2DS2-VASc = 4. AC = Eliquis   . Insomnia 12/09/2013  . OSA on CPAP   . Dyspnea on exertion - -essentially resolved with BB dose reduction & wgt loss 03/29/2013  . Overweight (BMI 25.0-29.9) -- Wgt back up 01/17/2013  . Atherosclerotic heart disease of native coronary artery without angina pectoris - s/p CABG, occluded SVG-D1; patent LIMA-LAD, SVG-OM, SVG- RCA   . Essential hypertension   . Hyperlipidemia with target LDL less than 70   . Aorto-iliac disease ([place])   . BCC (basal cell carcinoma), eyelid 01/03/2013  . Allergic rhinitis 08/17/2009  .  Adaptation reaction 05/28/2009  . Acid reflux 05/28/2009  . Arthritis, degenerative 05/28/2009  . 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 Carotid artery occlusion without infarction 06/01/2006  . PAD (peripheral artery disease) - bilateral common iliac stents 12/15/2005  . Aortoiliac occlusive disease ([place]) 11/30/2005  . Atherosclerotic heart disease of artery bypass graft 09/01/1997   Erick Blinks, SPT  This entire session was performed under direct supervision and direction of a licensed therapist/therapist assistant . I have personally read, edited and approve of the note as written.   Janna Arch, PT, DPT   07/05/2018, 3:32  PM  [place] MAIN [facility] SERVICES [address] [place], [place], [address] Phone: [phone]   Fax:  [phone]  Name: [name] MRN: [id number] Date of Birth: [birth year]

## 2018-07-06 ENCOUNTER — Ambulatory Visit (HOSPITAL_BASED_OUTPATIENT_CLINIC_OR_DEPARTMENT_OTHER): Payer: Medicare Other | Attending: Internal Medicine | Admitting: Radiology

## 2018-07-06 DIAGNOSIS — G4733 Obstructive sleep apnea (adult) (pediatric): Secondary | ICD-10-CM

## 2018-07-06 DIAGNOSIS — Z9989 Dependence on other enabling machines and devices: Principal | ICD-10-CM

## 2018-07-07 ENCOUNTER — Ambulatory Visit: Payer: Medicare Other

## 2018-07-07 DIAGNOSIS — R2681 Unsteadiness on feet: Secondary | ICD-10-CM

## 2018-07-07 DIAGNOSIS — M6281 Muscle weakness (generalized): Secondary | ICD-10-CM

## 2018-07-07 DIAGNOSIS — R262 Difficulty in walking, not elsewhere classified: Secondary | ICD-10-CM

## 2018-07-07 DIAGNOSIS — R2689 Other abnormalities of gait and mobility: Secondary | ICD-10-CM | POA: Diagnosis not present

## 2018-07-07 NOTE — Therapy (Addendum)
[place] MAIN [facility] SERVICES [address] [place], [place], [address] Phone: [phone]   Fax:  [phone]  Physical Therapy Treatment  Patient Details  Name: [name] MRN: [id number] Date of Birth: [birth year] No data recorded  Encounter Date: 07/07/2018  PT End of Session - 07/07/18 1246    Visit Number  11    Number of Visits  32    Date for PT Re-Evaluation  08/30/18    Authorization Type  PN 1/10 starts 11/4    PT Start Time  1300    PT Stop Time  1345    PT Time Calculation (min)  45 min    Equipment Utilized During Treatment  Gait belt    Activity Tolerance  Patient tolerated treatment well;Patient limited by fatigue    Behavior During Therapy  [facility] for tasks assessed/performed       Past Medical History:  Diagnosis Date  . Arthritis   . Atherosclerotic heart disease of native coronary artery without angina pectoris 1998   - s/p CABG, occluded SVG-D1; patent LIMA-LAD, SVG-OM, SVG- RCA  . Basal cell carcinoma of eyelid 01/03/2013   2019 - R side of Nose  . Benign neoplasm of ascending colon   . Benign neoplasm of descending colon   . Cancer [facility])    Skin cancer  . Colon polyps   . COPD (chronic obstructive pulmonary disease) ([place])   . GERD (gastroesophageal reflux disease)   . Hemorrhoid 02/10/2015  . History of hiatal hernia   . HOH (hard of hearing)   . Hypertension   . Memory loss   . Osteoporosis   . PAF (paroxysmal atrial fibrillation) ([place])   . Prostate disorder   . Sciatic pain    Chronic  . Sleep apnea    cpap  . Temporary cerebral vascular dysfunction 02/10/2015   Had negative work-up.  July, 2012.  No medication changes, had forgot them that day, got dehydrated.     Past Surgical History:  Procedure Laterality Date  . ANKLE SURGERY    . Arch aortogram and carotid aortogram  06/02/2006   Dr Oneida Alar did surgery  . BASAL CELL CARCINOMA EXCISION  04/2016   Dermatology  . CARDIAC CATHETERIZATION   01/30/1998    (Dr. Linard Millers): Native LAD & mRCA CTO. 90% D1. Mod Cx. SVG-D1 CTO. SVG-RCA & SVG-OM along with LIMA-LAD patent;  LV NORMAL Fxn  . CAROTID ENDARTERECTOMY Left Oct. 29, 2007   Dr. Oneida Alar:  . CATARACT EXTRACTION W/PHACO Right 03/05/2017   Procedure: CATARACT EXTRACTION PHACO AND INTRAOCULAR LENS PLACEMENT (IOC);  Surgeon: Leandrew Koyanagi, MD;  Location: [facility] ORS;  Service: Ophthalmology;  Laterality: Right;  [place] 00:58.5 AP% 10.6 CDE 6.20 Fluid Pack lot # [id number]  . CATARACT EXTRACTION W/PHACO Left 04/15/2017   Procedure: CATARACT EXTRACTION PHACO AND INTRAOCULAR LENS PLACEMENT ([place])  Left;  Surgeon: Leandrew Koyanagi, MD;  Location: [place];  Service: Ophthalmology;  Laterality: Left;  . COLONOSCOPY WITH PROPOFOL N/A 09/15/2017   Procedure: COLONOSCOPY WITH PROPOFOL;  Surgeon: Lucilla Lame, MD;  Location: [facility] ENDOSCOPY;  Service: Endoscopy;  Laterality: N/A;  . CORONARY ARTERY BYPASS GRAFT  1998   LIMA-LAD, SVG-OM, SVG-RPDA, SVG-DIAG  . CPET / MET  12/2012   Mild chronotropic incompetence - read 82% of predicted; also reduced effort; peak VO2 15.7 / 75% (did not reach Max effort) -- suggested ischemic response in last 1.5 minutes of exercise. Normal pulmonary function on PFTs but poor response  to  . DOPPLER ECHOCARDIOGRAPHY  11/20/2010   EF =>55%; LV norm mild aortic scelorosis  . ESOPHAGOGASTRODUODENOSCOPY (EGD) WITH PROPOFOL N/A 09/15/2017   Procedure: ESOPHAGOGASTRODUODENOSCOPY (EGD) WITH PROPOFOL;  Surgeon: Lucilla Lame, MD;  Location: [facility] ENDOSCOPY;  Service: Endoscopy;  Laterality: N/A;  . FRACTURE SURGERY Right 03/19/2015   wrist  . HIATAL HERNIA REPAIR    . ILIAC ARTERY STENT  12/15/2005   PTA and direct stenting rgt and lft common iliac arteries  . NM GATED MYOVIEW ([place] [facility])  12/2017   Low Risk - no ischemia or infarct.  EF > 65%. no RWMA  . NM MYOCAR PERF WALL MOTION  11/11/2010; February 2016   a) TM STRESS----NORMAL PERFUSION ,EF 68%; b) EF 50-55%.  Normal LV function. No significant ischemia or infarction.  . ORIF ELBOW FRACTURE Right 01/01/2017   Procedure: OPEN REDUCTION INTERNAL FIXATION (ORIF) ELBOW/OLECRANON FRACTURE;  Surgeon: Hessie Knows, MD;  Location: [facility] ORS;  Service: Orthopedics;  Laterality: Right;  . ORIF WRIST FRACTURE Right 03/19/2015   Procedure: OPEN REDUCTION INTERNAL FIXATION (ORIF) WRIST FRACTURE;  Surgeon: Hessie Knows, MD;  Location: [facility] ORS;  Service: Orthopedics;  Laterality: Right;  . THORACIC AORTA - CAROTID ANGIOGRAM  October 2007   Dr. 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 TRANSTHORACIC ECHOCARDIOGRAM  February 2016   [facility]: Normal LV function. Dilated left atrium.    There were no vitals filed for this visit.  Subjective Assessment - 07/07/18 1307    Subjective  Patient states he has been having issues with CPAP machine at assisted living facility. Denies any falls since last visit. Reports HEP compliance.     Patient is accompained by:  Family member    Pertinent History  Patient goes 5x/week for radiation therapy for prostate cancer. Is getting weaker and no longer goes to the senior center. Just started using cane, when walks a lot his low back and legs ache. He has a significant CV history including quadruple bypass, CAD w cardiac stents, on Eliquis, left carotid endarterectomy, AAA. He is functionally independent but lives alone, his partner lives 5 minutes down the road and seems to assist him with daily living. Activity as tolerated per patient report. Has history of memory deficits.     Limitations  Walking;Standing;House hold activities;Other (comment);Lifting    How long can you sit comfortably?  n/a    How long can you stand comfortably?  needs to hold onto cane.     How long can you walk comfortably?  40 ft     Patient Stated Goals  improve stability    Currently in Pain?  No/denies    Pain Onset  In the  past 7 days              Nustep lvl 3 4 minutes >60rpm for cardiovascular challenge  Leg press 2x10. 165lbs. Verbal cues to decrease speed of movement.    Seated     Seated gluteal squeezes without back support. x10 3 second holds  Seated marches x15 each LE. Verbal cues to decrease speed of movement    Standing:  Standing upright raises with 3lb bar on airex pad for core activation and strength. CGA   Standing rotations with 3lb bar airex pad CGA. Verbal visual cues to maintain elbow extension and to use trunk and abdominal muscles   Standing heel to rock on half form roller, flat side up. CGA.  1UE support.  x20  Standing step up and back down on 4in step. BUE support and CGA. Verbal cues to step down one foot at a time and widen BOS.   Standing lateral step up onto 4in step. BUE and CGA. Verbal cues to keep feet placed forward.    Standing balloon taps on airex pad in and out of BOS. CGA. 3 minutes    Standing toe raises x10 1 UE support CGA   Standing heel raises x10 1 UE supportCGA. Verbal cues to not lean forward.  Stepping over 2 hurdles in //bars.  1 UE support. Verbal cues to increase step height for foot clearance     Exercises were alternated between seated and standing due to fatigue   Patient required verbal cues for body mechanics and CGA .                    PT Education - 07/07/18 1246    Education provided  Yes    Education Details  exercise technique, HEP, transfer of care    Person(s) Educated  Patient    Methods  Explanation;Demonstration;Verbal cues    Comprehension  Verbalized understanding;Returned demonstration;Need further instruction       PT Short Term Goals - 07/05/18 1332      PT SHORT TERM GOAL #1   Title  Patient will be independent in home exercise program to improve strength/mobility for better functional independence with ADLs.    Baseline  9/17: HEP given 11/4: compliant at this time    Time  2    Period   Weeks    Status  Partially Met      PT SHORT TERM GOAL #2   Title  Patient will deny any falls over past 2 weeks to demonstrate improved safety awareness at home and work.     Baseline  9/17: progressive weakness 11/4: denies falls last 2 weeks    Time  2    Period  Weeks    Status  Achieved      PT SHORT TERM GOAL #3   Title  Patient will increase BLE gross strength to 4-/5 as to improve functional strength for independent gait, increased standing tolerance and increased ADL ability.    Baseline  9/17: Hip flex: L 3/5 R 3+/5 abd: 2+/5, 3-/5; add 2/5 2+/5 11/4: hip flexion L 3+/5 R 3+/5 abd 2+/5, 2+/5 add 2+/5, 2+/5    Time  2    Period  Weeks    Status  Partially Met        PT Long Term Goals - 07/05/18 1444      PT LONG TERM GOAL #1   Title  Patient will increase BLE gross strength to 4+/5 as to improve functional strength for independent gait, increased standing tolerance and increased ADL ability.    Baseline  9/17: Hip flex: L 3/5 R 3+/5 abd: 2+/5, 3-/5; add 2/5 2+/5 11/4: hip flexion L 3+/5 R 3+/5 abd 2+/5, 2+/5 add 2+/5, 2+/5    Time  8    Period  Weeks    Status  Partially Met    Target Date  08/30/18      PT LONG TERM GOAL #2   Title  Patient will increase ABC scale score >80% to demonstrate better functional mobility and better confidence with ADLs.     Baseline  9/17: 68.13% 11/4: 76.6%    Time  8    Period  Weeks    Status  Partially Met  Target Date  08/30/18      PT LONG TERM GOAL #3   Title  Patient will increase Berg Balance score by > 6 points (44/56)  to demonstrate decreased fall risk during functional activities.    Baseline  9/17: 38/56 11/4: 39/56    Time  8    Period  Weeks    Status  Partially Met    Target Date  08/30/18      PT LONG TERM GOAL #4   Title  Patient will increase six minute walk test distance to >1000 for progression to community ambulator and improve gait ability    Baseline  9/18: 1300 ft w/o cane 11/4: 962f w/o cane    Time   8    Period  Weeks    Status  On-going    Target Date  08/30/18      PT LONG TERM GOAL #5   Title  Patient (> 838years old) will complete five times sit to stand test in < 15 seconds without UE support indicating an increased LE strength and improved balance.    Baseline  11/4: requires 1 UE support    Time  8    Period  Weeks    Status  New    Target Date  08/30/18            Plan - 07/07/18 1401    Clinical Impression Statement  Patient tolerated session well with increased ability to tolerate standing and strengthening exercises. Patient had increased difficulty with standing step up with increased repetions due to decreased fatigue and muscular functional capacity. Introduced leg press for continued LE strengthening to increase ease of standing from chair. Patient continues to require cueing to decrease speed of movements throughout therapy session. Patient will continue to benefit from skilled physical therapy to improve strength, balance, and functional mobility.     Rehab Potential  Fair    Clinical Impairments Affecting Rehab Potential  (+) good support system, high prior level of function (-) currently undergoing radiation therapy for prostate cancer, memory deficits     PT Frequency  2x / week    PT Duration  8 weeks    PT Treatment/Interventions  ADLs/Self Care Home Management;Aquatic Therapy;Cryotherapy;Moist Heat;Traction;Ultrasound;Therapeutic activities;Functional mobility training;Stair training;Gait training;DME Instruction;Therapeutic exercise;Balance training;Neuromuscular re-education;Patient/family education;Manual techniques;Passive range of motion;Energy conservation;Vestibular;Taping    PT Next Visit Plan  strength, balance     PT Home Exercise Plan  see above    Consulted and Agree with Plan of Care  Patient       Patient will benefit from skilled therapeutic intervention in order to improve the following deficits and impairments:  Abnormal gait,  Cardiopulmonary status limiting activity, Decreased activity tolerance, Decreased balance, Decreased knowledge of precautions, Decreased endurance, Decreased coordination, Decreased cognition, Decreased knowledge of use of DME, Decreased mobility, Difficulty walking, Decreased safety awareness, Decreased strength, Impaired flexibility, Impaired perceived functional ability, Impaired sensation, Postural dysfunction, Improper body mechanics, Pain  Visit Diagnosis: Unsteadiness on feet  Muscle weakness (generalized)  Other abnormalities of gait and mobility  Difficulty in walking, not elsewhere classified     Problem List Patient Active Problem List   Diagnosis Date Noted  . Prostate cancer ([place] 02/25/2018  . Prostatic cyst 01/15/2018  . 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 ([place] 09/15/2017  . Blood in stool   . Abnormal feces   . Stricture and stenosis of esophagus   . Mild cognitive impairment 08/18/2017  . Senile purpura ([place] 04/29/2017  . Anemia  04/29/2017  . Frequent falls 04/29/2017  . Benign localized hyperplasia of prostate with urinary obstruction 07/15/2016  . Elevated PSA 07/15/2016  . Incomplete emptying of bladder 07/15/2016  . Urge incontinence 07/15/2016  . Hypothyroidism 04/25/2016  . Macular degeneration 04/25/2016  . Erectile dysfunction due to arterial insufficiency   . Ischemic heart disease with chronotropic incompetence   . CN (constipation) 02/10/2015  . DD (diverticular disease) 02/10/2015  . Fatigue 02/10/2015  . Lichen planus 96/29/5284  . Restless leg 02/10/2015  . 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 B12 deficiency 02/10/2015  . COPD (chronic obstructive pulmonary disease) ([place]) 11/14/2014  . Post Inflammatory Lung Changes 11/14/2014  . PAF (paroxysmal atrial fibrillation) ([facility]) CHA2DS2-VASc = 4. AC = Eliquis   . Insomnia 12/09/2013  . OSA on CPAP   . Dyspnea on exertion - -essentially resolved with BB dose reduction & wgt loss 03/29/2013  .  Overweight (BMI 25.0-29.9) -- Wgt back up 01/17/2013  . Atherosclerotic heart disease of native coronary artery without angina pectoris - s/p CABG, occluded SVG-D1; patent LIMA-LAD, SVG-OM, SVG- RCA   . Essential hypertension   . Hyperlipidemia with target LDL less than 70   . Aorto-iliac disease ([place])   . BCC (basal cell carcinoma), eyelid 01/03/2013  . Allergic rhinitis 08/17/2009  . Adaptation reaction 05/28/2009  . Acid reflux 05/28/2009  . Arthritis, degenerative 05/28/2009  . 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 Carotid artery occlusion without infarction 06/01/2006  . PAD (peripheral artery disease) - bilateral common iliac stents 12/15/2005  . Aortoiliac occlusive disease ([place]) 11/30/2005  . Atherosclerotic heart disease of artery bypass graft 09/01/1997   Erick Blinks, SPT  This entire session was performed under direct supervision and direction of a licensed therapist/therapist assistant . I have personally read, edited and approve of the note as written.  Janna Arch, PT, DPT   07/07/2018, 3:33 PM  [place] MAIN [facility] SERVICES [address] [place], [place], [address] Phone: [phone]   Fax:  [phone]  Name: [name] MRN: [id number] Date of Birth: [birth year]

## 2018-07-12 ENCOUNTER — Telehealth (INDEPENDENT_AMBULATORY_CARE_PROVIDER_SITE_OTHER): Payer: Medicare Other | Admitting: Family Medicine

## 2018-07-12 ENCOUNTER — Ambulatory Visit: Payer: Medicare Other

## 2018-07-12 DIAGNOSIS — R2681 Unsteadiness on feet: Secondary | ICD-10-CM | POA: Diagnosis not present

## 2018-07-12 DIAGNOSIS — R300 Dysuria: Secondary | ICD-10-CM

## 2018-07-12 DIAGNOSIS — M6281 Muscle weakness (generalized): Secondary | ICD-10-CM | POA: Diagnosis not present

## 2018-07-12 DIAGNOSIS — R262 Difficulty in walking, not elsewhere classified: Secondary | ICD-10-CM

## 2018-07-12 DIAGNOSIS — R2689 Other abnormalities of gait and mobility: Secondary | ICD-10-CM

## 2018-07-12 LAB — POCT URINALYSIS DIPSTICK
Bilirubin, UA: NEGATIVE
Glucose, UA: NEGATIVE
Ketones, UA: NEGATIVE
Nitrite, UA: NEGATIVE
Protein, UA: NEGATIVE
Spec Grav, UA: 1.025 (ref 1.010–1.025)
Urobilinogen, UA: 0.2 E.U./dL
pH, UA: 6 (ref 5.0–8.0)

## 2018-07-12 NOTE — Telephone Encounter (Signed)
Patient came by the office accompanied with his friend [name] instead of waiting on the order from [facility]. Urine was collected and orders placed. Advised [name] (patient's friend) that we will call once results are back.   Urine did have a large amount of blood, but patient's friend reports that about 4 weeks ago he had a procedure done in regards to prostate cancer. Urine did not show any visible blood.

## 2018-07-12 NOTE — Telephone Encounter (Signed)
That's fine. Order has been written.

## 2018-07-12 NOTE — Telephone Encounter (Signed)
[name] with [name] is requesting written order for them to do a UA on pt's urine. Pt is complaining of burning when voiding, urine dark, cloudy, & had bad odor. Please advise. Thanks TNP

## 2018-07-12 NOTE — Therapy (Addendum)
[place] MAIN [facility] SERVICES [address] [place], [place], [address] Phone: [phone]   Fax:  [phone]  Physical Therapy Treatment  Patient Details  Name: [name] MRN: [id number] Date of Birth: [birth year] No data recorded  Encounter Date: 07/12/2018  PT End of Session - 07/12/18 1308    Visit Number  12    Number of Visits  32    Date for PT Re-Evaluation  08/30/18    Authorization Type  PN 2/10 starts 11/4    PT Start Time  1300    PT Stop Time  1345    PT Time Calculation (min)  45 min    Equipment Utilized During Treatment  Gait belt    Activity Tolerance  Patient tolerated treatment well;Patient limited by fatigue    Behavior During Therapy  [facility] for tasks assessed/performed       Past Medical History:  Diagnosis Date  . Arthritis   . Atherosclerotic heart disease of native coronary artery without angina pectoris 1998   - s/p CABG, occluded SVG-D1; patent LIMA-LAD, SVG-OM, SVG- RCA  . Basal cell carcinoma of eyelid 01/03/2013   2019 - R side of Nose  . Benign neoplasm of ascending colon   . Benign neoplasm of descending colon   . Cancer [facility])    Skin cancer  . Colon polyps   . COPD (chronic obstructive pulmonary disease) ([place])   . GERD (gastroesophageal reflux disease)   . Hemorrhoid 02/10/2015  . History of hiatal hernia   . HOH (hard of hearing)   . Hypertension   . Memory loss   . Osteoporosis   . PAF (paroxysmal atrial fibrillation) ([place])   . Prostate disorder   . Sciatic pain    Chronic  . Sleep apnea    cpap  . Temporary cerebral vascular dysfunction 02/10/2015   Had negative work-up.  July, 2012.  No medication changes, had forgot them that day, got dehydrated.     Past Surgical History:  Procedure Laterality Date  . ANKLE SURGERY    . Arch aortogram and carotid aortogram  06/02/2006   Dr Oneida Alar did surgery  . BASAL CELL CARCINOMA EXCISION  04/2016   Dermatology  . CARDIAC CATHETERIZATION   01/30/1998    (Dr. Linard Millers): Native LAD & mRCA CTO. 90% D1. Mod Cx. SVG-D1 CTO. SVG-RCA & SVG-OM along with LIMA-LAD patent;  LV NORMAL Fxn  . CAROTID ENDARTERECTOMY Left Oct. 29, 2007   Dr. Oneida Alar:  . CATARACT EXTRACTION W/PHACO Right 03/05/2017   Procedure: CATARACT EXTRACTION PHACO AND INTRAOCULAR LENS PLACEMENT (IOC);  Surgeon: Leandrew Koyanagi, MD;  Location: [facility] ORS;  Service: Ophthalmology;  Laterality: Right;  [place] 00:58.5 AP% 10.6 CDE 6.20 Fluid Pack lot # [id number]  . CATARACT EXTRACTION W/PHACO Left 04/15/2017   Procedure: CATARACT EXTRACTION PHACO AND INTRAOCULAR LENS PLACEMENT ([place])  Left;  Surgeon: Leandrew Koyanagi, MD;  Location: [place];  Service: Ophthalmology;  Laterality: Left;  . COLONOSCOPY WITH PROPOFOL N/A 09/15/2017   Procedure: COLONOSCOPY WITH PROPOFOL;  Surgeon: Lucilla Lame, MD;  Location: [facility] ENDOSCOPY;  Service: Endoscopy;  Laterality: N/A;  . CORONARY ARTERY BYPASS GRAFT  1998   LIMA-LAD, SVG-OM, SVG-RPDA, SVG-DIAG  . CPET / MET  12/2012   Mild chronotropic incompetence - read 82% of predicted; also reduced effort; peak VO2 15.7 / 75% (did not reach Max effort) -- suggested ischemic response in last 1.5 minutes of exercise. Normal pulmonary function on PFTs but poor response  to  . DOPPLER ECHOCARDIOGRAPHY  11/20/2010   EF =>55%; LV norm mild aortic scelorosis  . ESOPHAGOGASTRODUODENOSCOPY (EGD) WITH PROPOFOL N/A 09/15/2017   Procedure: ESOPHAGOGASTRODUODENOSCOPY (EGD) WITH PROPOFOL;  Surgeon: Lucilla Lame, MD;  Location: [facility] ENDOSCOPY;  Service: Endoscopy;  Laterality: N/A;  . FRACTURE SURGERY Right 03/19/2015   wrist  . HIATAL HERNIA REPAIR    . ILIAC ARTERY STENT  12/15/2005   PTA and direct stenting rgt and lft common iliac arteries  . NM GATED MYOVIEW ([place] HX)  12/2017   Low Risk - no ischemia or infarct.  EF > 65%. no RWMA  . NM MYOCAR PERF WALL MOTION  11/11/2010; February 2016   a) TM STRESS----NORMAL PERFUSION ,EF 68%; b) EF 50-55%.  Normal LV function. No significant ischemia or infarction.  . ORIF ELBOW FRACTURE Right 01/01/2017   Procedure: OPEN REDUCTION INTERNAL FIXATION (ORIF) ELBOW/OLECRANON FRACTURE;  Surgeon: Hessie Knows, MD;  Location: [facility] ORS;  Service: Orthopedics;  Laterality: Right;  . ORIF WRIST FRACTURE Right 03/19/2015   Procedure: OPEN REDUCTION INTERNAL FIXATION (ORIF) WRIST FRACTURE;  Surgeon: Hessie Knows, MD;  Location: [facility] ORS;  Service: Orthopedics;  Laterality: Right;  . THORACIC AORTA - CAROTID ANGIOGRAM  October 2007   Dr. 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 TRANSTHORACIC ECHOCARDIOGRAM  February 2016   [facility]: Normal LV function. Dilated left atrium.    There were no vitals filed for this visit.  Subjective Assessment - 07/12/18 1305    Subjective  Patient states he is doing pretty good. Denies any falls or stumbles since last visit. States he completes HEP 2-3x a week. Reports he has went to exercise class at facility.     Patient is accompained by:  Family member    Pertinent History  Patient goes 5x/week for radiation therapy for prostate cancer. Is getting weaker and no longer goes to the senior center. Just started using cane, when walks a lot his low back and legs ache. He has a significant CV history including quadruple bypass, CAD w cardiac stents, on Eliquis, left carotid endarterectomy, AAA. He is functionally independent but lives alone, his partner lives 5 minutes down the road and seems to assist him with daily living. Activity as tolerated per patient report. Has history of memory deficits.     Limitations  Walking;Standing;House hold activities;Other (comment);Lifting    How long can you sit comfortably?  n/a    How long can you stand comfortably?  needs to hold onto cane.     How long can you walk comfortably?  40 ft     Patient Stated Goals  improve stability    Currently in Pain?   No/denies    Pain Onset  In the past 7 days            Nustep lvl 4 5 minutes >60rpm for cardiovascular challenge   Leg press 2x10. 165lbs. Verbal cues to decrease speed of movement.    Seated     Seated marches x10 3 second holds each LE. Verbal cues to decrease speed of movement and to not use back of chair   Standing:   Standing upright raises with 3lb bar on airex pad for core activation and strength. CGA   Standing rotations with 3lb bar airex pad CGA. Verbal visual cues to maintain elbow extension and to use trunk and abdominal muscles   Standing heel to rock on half form roller, flat  side up. CGA.  1UE support. x20  Standing side steps on airex pad tandem. x3 lengths. Verbal cues to keep toes forward  Standing airex pad toe taps 6in step. 1 UE support. CGA  Standing airex pad lateral toe taps 6inch step. x10 each LE. CGA. 1 UE support   Standing balloon taps on airex pad in and out of BOS. CGA. 3 minutes. Verbal cues to engage core     Exercises were alternated between seated and standing due to fatigue   Patient required verbal cues for body mechanics and CGA .                        PT Education - 07/12/18 1307    Education provided  Yes    Education Details  exercise technique, HEP     Person(s) Educated  Patient    Methods  Explanation;Demonstration;Verbal cues    Comprehension  Verbalized understanding;Returned demonstration;Need further instruction       PT Short Term Goals - 07/05/18 1332      PT SHORT TERM GOAL #1   Title  Patient will be independent in home exercise program to improve strength/mobility for better functional independence with ADLs.    Baseline  9/17: HEP given 11/4: compliant at this time    Time  2    Period  Weeks    Status  Partially Met      PT SHORT TERM GOAL #2   Title  Patient will deny any falls over past 2 weeks to demonstrate improved safety awareness at home and work.     Baseline  9/17:  progressive weakness 11/4: denies falls last 2 weeks    Time  2    Period  Weeks    Status  Achieved      PT SHORT TERM GOAL #3   Title  Patient will increase BLE gross strength to 4-/5 as to improve functional strength for independent gait, increased standing tolerance and increased ADL ability.    Baseline  9/17: Hip flex: L 3/5 R 3+/5 abd: 2+/5, 3-/5; add 2/5 2+/5 11/4: hip flexion L 3+/5 R 3+/5 abd 2+/5, 2+/5 add 2+/5, 2+/5    Time  2    Period  Weeks    Status  Partially Met        PT Long Term Goals - 07/05/18 1444      PT LONG TERM GOAL #1   Title  Patient will increase BLE gross strength to 4+/5 as to improve functional strength for independent gait, increased standing tolerance and increased ADL ability.    Baseline  9/17: Hip flex: L 3/5 R 3+/5 abd: 2+/5, 3-/5; add 2/5 2+/5 11/4: hip flexion L 3+/5 R 3+/5 abd 2+/5, 2+/5 add 2+/5, 2+/5    Time  8    Period  Weeks    Status  Partially Met    Target Date  08/30/18      PT LONG TERM GOAL #2   Title  Patient will increase ABC scale score >80% to demonstrate better functional mobility and better confidence with ADLs.     Baseline  9/17: 68.13% 11/4: 76.6%    Time  8    Period  Weeks    Status  Partially Met    Target Date  08/30/18      PT LONG TERM GOAL #3   Title  Patient will increase Berg Balance score by > 6 points (44/56)  to demonstrate decreased  fall risk during functional activities.    Baseline  9/17: 38/56 11/4: 39/56    Time  8    Period  Weeks    Status  Partially Met    Target Date  08/30/18      PT LONG TERM GOAL #4   Title  Patient will increase six minute walk test distance to >1000 for progression to community ambulator and improve gait ability    Baseline  9/18: 1300 ft w/o cane 11/4: 92f w/o cane    Time  8    Period  Weeks    Status  On-going    Target Date  08/30/18      PT LONG TERM GOAL #5   Title  Patient (> 635years old) will complete five times sit to stand test in < 15 seconds  without UE support indicating an increased LE strength and improved balance.    Baseline  11/4: requires 1 UE support    Time  8    Period  Weeks    Status  New    Target Date  08/30/18            Plan - 07/12/18 1350    Clinical Impression Statement  Patient tolerated session well with focus on dynamic balance to decrease fall risk. Patient challenged with balloon taps on airex pad with use of UE for stabilization and LOB posteriorly. Stability improved with cues to engage core during exercise. Introduced side stepping on airex pad to further challenge balance. Patient will continue to benefit from skilled physical therapy to improve strength, balance, and functional mobility.     Rehab Potential  Fair    Clinical Impairments Affecting Rehab Potential  (+) good support system, high prior level of function (-) currently undergoing radiation therapy for prostate cancer, memory deficits     PT Frequency  2x / week    PT Duration  8 weeks    PT Treatment/Interventions  ADLs/Self Care Home Management;Aquatic Therapy;Cryotherapy;Moist Heat;Traction;Ultrasound;Therapeutic activities;Functional mobility training;Stair training;Gait training;DME Instruction;Therapeutic exercise;Balance training;Neuromuscular re-education;Patient/family education;Manual techniques;Passive range of motion;Energy conservation;Vestibular;Taping    PT Next Visit Plan  strength, balance     PT Home Exercise Plan  see above    Consulted and Agree with Plan of Care  Patient       Patient will benefit from skilled therapeutic intervention in order to improve the following deficits and impairments:  Abnormal gait, Cardiopulmonary status limiting activity, Decreased activity tolerance, Decreased balance, Decreased knowledge of precautions, Decreased endurance, Decreased coordination, Decreased cognition, Decreased knowledge of use of DME, Decreased mobility, Difficulty walking, Decreased safety awareness, Decreased strength,  Impaired flexibility, Impaired perceived functional ability, Impaired sensation, Postural dysfunction, Improper body mechanics, Pain  Visit Diagnosis: Unsteadiness on feet  Muscle weakness (generalized)  Other abnormalities of gait and mobility  Difficulty in walking, not elsewhere classified     Problem List Patient Active Problem List   Diagnosis Date Noted  . Prostate cancer ([place] 02/25/2018  . Prostatic cyst 01/15/2018  . 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 ([place] 09/15/2017  . Blood in stool   . Abnormal feces   . Stricture and stenosis of esophagus   . Mild cognitive impairment 08/18/2017  . Senile purpura ([place] 04/29/2017  . Anemia 04/29/2017  . Frequent falls 04/29/2017  . Benign localized hyperplasia of prostate with urinary obstruction 07/15/2016  . Elevated PSA 07/15/2016  . Incomplete emptying of bladder 07/15/2016  . Urge incontinence 07/15/2016  . Hypothyroidism 04/25/2016  . Macular degeneration 04/25/2016  .  Erectile dysfunction due to arterial insufficiency   . Ischemic heart disease with chronotropic incompetence   . CN (constipation) 02/10/2015  . DD (diverticular disease) 02/10/2015  . Fatigue 02/10/2015  . Lichen planus 02/29/1600  . Restless leg 02/10/2015  . 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 B12 deficiency 02/10/2015  . COPD (chronic obstructive pulmonary disease) ([place]) 11/14/2014  . Post Inflammatory Lung Changes 11/14/2014  . PAF (paroxysmal atrial fibrillation) ([facility]) CHA2DS2-VASc = 4. AC = Eliquis   . Insomnia 12/09/2013  . OSA on CPAP   . Dyspnea on exertion - -essentially resolved with BB dose reduction & wgt loss 03/29/2013  . Overweight (BMI 25.0-29.9) -- Wgt back up 01/17/2013  . Atherosclerotic heart disease of native coronary artery without angina pectoris - s/p CABG, occluded SVG-D1; patent LIMA-LAD, SVG-OM, SVG- RCA   . Essential hypertension   . Hyperlipidemia with target LDL less than 70   . Aorto-iliac disease ([place])   . BCC  (basal cell carcinoma), eyelid 01/03/2013  . Allergic rhinitis 08/17/2009  . Adaptation reaction 05/28/2009  . Acid reflux 05/28/2009  . Arthritis, degenerative 05/28/2009  . 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 Carotid artery occlusion without infarction 06/01/2006  . PAD (peripheral artery disease) - bilateral common iliac stents 12/15/2005  . Aortoiliac occlusive disease ([place]) 11/30/2005  . Atherosclerotic heart disease of artery bypass graft 09/01/1997   Erick Blinks, SPT  This entire session was performed under direct supervision and direction of a licensed therapist/therapist assistant . I have personally read, edited and approve of the note as written.  Janna Arch, PT, DPT   07/12/2018, 1:55 PM  [place] MAIN [facility] SERVICES [address] [place], [place], [address] Phone: [phone]   Fax:  [phone]  Name: DAMONEY JULIA MRN: [id number] Date of Birth: 09-26-1933

## 2018-07-13 ENCOUNTER — Other Ambulatory Visit (HOSPITAL_BASED_OUTPATIENT_CLINIC_OR_DEPARTMENT_OTHER): Payer: Medicare Other

## 2018-07-14 ENCOUNTER — Ambulatory Visit: Payer: Medicare Other

## 2018-07-14 ENCOUNTER — Telehealth: Payer: Self-pay

## 2018-07-14 DIAGNOSIS — R262 Difficulty in walking, not elsewhere classified: Secondary | ICD-10-CM | POA: Diagnosis not present

## 2018-07-14 DIAGNOSIS — R2681 Unsteadiness on feet: Secondary | ICD-10-CM | POA: Diagnosis not present

## 2018-07-14 DIAGNOSIS — M6281 Muscle weakness (generalized): Secondary | ICD-10-CM | POA: Diagnosis not present

## 2018-07-14 DIAGNOSIS — R2689 Other abnormalities of gait and mobility: Secondary | ICD-10-CM | POA: Diagnosis not present

## 2018-07-14 LAB — URINE CULTURE: Organism ID, Bacteria: NO GROWTH

## 2018-07-14 NOTE — Telephone Encounter (Signed)
-----   Message from [name], MD sent at 07/14/2018  7:44 AM EST ----- Urine culture is negative. No sign of infection. He may be a little dehydrated. Need to drink more water.

## 2018-07-14 NOTE — Therapy (Addendum)
[place] MAIN [facility] SERVICES [address] [place], [place], [address] Phone: [phone]   Fax:  [phone]  Physical Therapy Treatment  Patient Details  Name: [name] MRN: [id number] Date of Birth: 11-26-1933 No data recorded  Encounter Date: 07/14/2018  PT End of Session - 07/14/18 1255    Visit Number  13    Number of Visits  32    Date for PT Re-Evaluation  08/30/18    Authorization Type  PN 3/10 starts 11/4    PT Start Time  1300    PT Stop Time  1341    PT Time Calculation (min)  41 min    Equipment Utilized During Treatment  Gait belt    Activity Tolerance  Patient tolerated treatment well;Patient limited by fatigue    Behavior During Therapy  [facility] for tasks assessed/performed       Past Medical History:  Diagnosis Date  . Arthritis   . Atherosclerotic heart disease of native coronary artery without angina pectoris 1998   - s/p CABG, occluded SVG-D1; patent LIMA-LAD, SVG-OM, SVG- RCA  . Basal cell carcinoma of eyelid 01/03/2013   2019 - R side of Nose  . Benign neoplasm of ascending colon   . Benign neoplasm of descending colon   . Cancer [facility])    Skin cancer  . Colon polyps   . COPD (chronic obstructive pulmonary disease) ([place])   . GERD (gastroesophageal reflux disease)   . Hemorrhoid 02/10/2015  . History of hiatal hernia   . HOH (hard of hearing)   . Hypertension   . Memory loss   . Osteoporosis   . PAF (paroxysmal atrial fibrillation) ([place])   . Prostate disorder   . Sciatic pain    Chronic  . Sleep apnea    cpap  . Temporary cerebral vascular dysfunction 02/10/2015   Had negative work-up.  July, 2012.  No medication changes, had forgot them that day, got dehydrated.     Past Surgical History:  Procedure Laterality Date  . ANKLE SURGERY    . Arch aortogram and carotid aortogram  06/02/2006   Dr Oneida Alar did surgery  . BASAL CELL CARCINOMA EXCISION  04/2016   Dermatology  . CARDIAC CATHETERIZATION   01/30/1998    (Dr. Linard Millers): Native LAD & mRCA CTO. 90% D1. Mod Cx. SVG-D1 CTO. SVG-RCA & SVG-OM along with LIMA-LAD patent;  LV NORMAL Fxn  . CAROTID ENDARTERECTOMY Left Oct. 29, 2007   Dr. Oneida Alar:  . CATARACT EXTRACTION W/PHACO Right 03/05/2017   Procedure: CATARACT EXTRACTION PHACO AND INTRAOCULAR LENS PLACEMENT (IOC);  Surgeon: Leandrew Koyanagi, MD;  Location: [facility] ORS;  Service: Ophthalmology;  Laterality: Right;  [place] 00:58.5 AP% 10.6 CDE 6.20 Fluid Pack lot # [id number]  . CATARACT EXTRACTION W/PHACO Left 04/15/2017   Procedure: CATARACT EXTRACTION PHACO AND INTRAOCULAR LENS PLACEMENT ([place])  Left;  Surgeon: Leandrew Koyanagi, MD;  Location: [place];  Service: Ophthalmology;  Laterality: Left;  . COLONOSCOPY WITH PROPOFOL N/A 09/15/2017   Procedure: COLONOSCOPY WITH PROPOFOL;  Surgeon: Lucilla Lame, MD;  Location[facility] ENDOSCOPY;  Service: Endoscopy;  Laterality: N/A;  . CORONARY ARTERY BYPASS GRAFT  1998   LIMA-LAD, SVG-OM, SVG-RPDA, SVG-DIAG  . CPET / MET  12/2012   Mild chronotropic incompetence - read 82% of predicted; also reduced effort; peak VO2 15.7 / 75% (did not reach Max effort) -- suggested ischemic response in last 1.5 minutes of exercise. Normal pulmonary function on PFTs but poor response  to  . DOPPLER ECHOCARDIOGRAPHY  11/20/2010   EF =>55%; LV norm mild aortic scelorosis  . ESOPHAGOGASTRODUODENOSCOPY (EGD) WITH PROPOFOL N/A 09/15/2017   Procedure: ESOPHAGOGASTRODUODENOSCOPY (EGD) WITH PROPOFOL;  Surgeon: Lucilla Lame, MD;  Location: [facility] ENDOSCOPY;  Service: Endoscopy;  Laterality: N/A;  . FRACTURE SURGERY Right 03/19/2015   wrist  . HIATAL HERNIA REPAIR    . ILIAC ARTERY STENT  12/15/2005   PTA and direct stenting rgt and lft common iliac arteries  . NM GATED MYOVIEW ([place] HX)  12/2017   Low Risk - no ischemia or infarct.  EF > 65%. no RWMA  . NM MYOCAR PERF WALL MOTION  11/11/2010; February 2016   a) TM STRESS----NORMAL PERFUSION ,EF 68%; b) EF 50-55%.  Normal LV function. No significant ischemia or infarction.  . ORIF ELBOW FRACTURE Right 01/01/2017   Procedure: OPEN REDUCTION INTERNAL FIXATION (ORIF) ELBOW/OLECRANON FRACTURE;  Surgeon: Hessie Knows, MD;  Location: [facility] ORS;  Service: Orthopedics;  Laterality: Right;  . ORIF WRIST FRACTURE Right 03/19/2015   Procedure: OPEN REDUCTION INTERNAL FIXATION (ORIF) WRIST FRACTURE;  Surgeon: Hessie Knows, MD;  Location: [facility] ORS;  Service: Orthopedics;  Laterality: Right;  . THORACIC AORTA - CAROTID ANGIOGRAM  October 2007   Dr. 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 TRANSTHORACIC ECHOCARDIOGRAM  February 2016   [facility]: Normal LV function. Dilated left atrium.    There were no vitals filed for this visit.  Subjective Assessment - 07/14/18 1302    Subjective  Patient states he is doing pretty well. Denies any falls or LOB since last session. Reports no pain today.     Patient is accompained by:  Family member    Pertinent History  Patient goes 5x/week for radiation therapy for prostate cancer. Is getting weaker and no longer goes to the senior center. Just started using cane, when walks a lot his low back and legs ache. He has a significant CV history including quadruple bypass, CAD w cardiac stents, on Eliquis, left carotid endarterectomy, AAA. He is functionally independent but lives alone, his partner lives 5 minutes down the road and seems to assist him with daily living. Activity as tolerated per patient report. Has history of memory deficits.     Limitations  Walking;Standing;House hold activities;Other (comment);Lifting    How long can you sit comfortably?  n/a    How long can you stand comfortably?  needs to hold onto cane.     How long can you walk comfortably?  40 ft     Patient Stated Goals  improve stability    Currently in Pain?  No/denies    Pain Onset  In the past 7 days        Nustep lvl 4 5  minutes >60rpm for cardiovascular challenge   Leg press 2x15. 165lbs. Verbal cues to decrease speed of movement.   Ambulate in hallway with cane. Verbal cues to maintain wide BOS and heel contact. CGA. 4x63f   Ambulate with left/right/up/down head turns with cane. Verbal cues for wide BOS, weight shift, and heel contact. [id number]   Standing:   Standing upright raises with 3lb bar on airex pad for core activation and strength. CGA   Standing rotations with 3lb bar airex pad CGA. Verbal visual cues to maintain elbow extension and to use trunk and abdominal muscles   Standing heel to rock on half form roller, flat side up. CGA.  1UE support. x20  Standing on half foam roller (flat side down). NO UE support. CGA. 2x30seconds   Standing airex pad toe taps 6in step. 1 UE support. CGA   Standing airex pad lateral toe taps 6inch step. x10 each LE. CGA. 1 UE support   Standing on airex pad shooting basketballs. No UE support. CGA. Verbal cues to engage core. X48mnutes. Made into competition to increase patient interest    Exercises were alternated between seated and standing due to fatigue   Patient required verbal cues for body mechanics and CGA .                           PT Education - 07/14/18 1254    Education provided  Yes    Education Details  exercise technique, HEP    Person(s) Educated  Patient    Methods  Explanation;Demonstration;Verbal cues    Comprehension  Verbalized understanding;Returned demonstration;Need further instruction       PT Short Term Goals - 07/05/18 1332      PT SHORT TERM GOAL #1   Title  Patient will be independent in home exercise program to improve strength/mobility for better functional independence with ADLs.    Baseline  9/17: HEP given 11/4: compliant at this time    Time  2    Period  Weeks    Status  Partially Met      PT SHORT TERM GOAL #2   Title  Patient will deny any falls over past 2 weeks to demonstrate  improved safety awareness at home and work.     Baseline  9/17: progressive weakness 11/4: denies falls last 2 weeks    Time  2    Period  Weeks    Status  Achieved      PT SHORT TERM GOAL #3   Title  Patient will increase BLE gross strength to 4-/5 as to improve functional strength for independent gait, increased standing tolerance and increased ADL ability.    Baseline  9/17: Hip flex: L 3/5 R 3+/5 abd: 2+/5, 3-/5; add 2/5 2+/5 11/4: hip flexion L 3+/5 R 3+/5 abd 2+/5, 2+/5 add 2+/5, 2+/5    Time  2    Period  Weeks    Status  Partially Met        PT Long Term Goals - 07/05/18 1444      PT LONG TERM GOAL #1   Title  Patient will increase BLE gross strength to 4+/5 as to improve functional strength for independent gait, increased standing tolerance and increased ADL ability.    Baseline  9/17: Hip flex: L 3/5 R 3+/5 abd: 2+/5, 3-/5; add 2/5 2+/5 11/4: hip flexion L 3+/5 R 3+/5 abd 2+/5, 2+/5 add 2+/5, 2+/5    Time  8    Period  Weeks    Status  Partially Met    Target Date  08/30/18      PT LONG TERM GOAL #2   Title  Patient will increase ABC scale score >80% to demonstrate better functional mobility and better confidence with ADLs.     Baseline  9/17: 68.13% 11/4: 76.6%    Time  8    Period  Weeks    Status  Partially Met    Target Date  08/30/18      PT LONG TERM GOAL #3   Title  Patient will increase Berg Balance score by > 6 points (44/56)  to demonstrate decreased fall risk during  functional activities.    Baseline  9/17: 38/56 11/4: 39/56    Time  8    Period  Weeks    Status  Partially Met    Target Date  08/30/18      PT LONG TERM GOAL #4   Title  Patient will increase six minute walk test distance to >1000 for progression to community ambulator and improve gait ability    Baseline  9/18: 1300 ft w/o cane 11/4: 955f w/o cane    Time  8    Period  Weeks    Status  On-going    Target Date  08/30/18      PT LONG TERM GOAL #5   Title  Patient (> 682years old)  will complete five times sit to stand test in < 15 seconds without UE support indicating an increased LE strength and improved balance.    Baseline  11/4: requires 1 UE support    Time  8    Period  Weeks    Status  New    Target Date  08/30/18            Plan - 07/14/18 1345    Clinical Impression Statement  Patient tolerated session well. Ambulated in hallway for improved step height and gait mechanics to improve community ambulation and decrease fall risk. Continued dynamic balance and strength exercises to improve balance. Increased repeition of leg press for increased muscular strength and muscular capacity. Patient had increased difficulty standing on half foam roller requiring use UE for stabilization frequently. Patient enjoys competitions throughout sessions to increase interest and motivation. Patient will continue to benefit from skilled physical therapy to improve strength, balance, and functional mobility.     Rehab Potential  Fair    Clinical Impairments Affecting Rehab Potential  (+) good support system, high prior level of function (-) currently undergoing radiation therapy for prostate cancer, memory deficits     PT Frequency  2x / week    PT Duration  8 weeks    PT Treatment/Interventions  ADLs/Self Care Home Management;Aquatic Therapy;Cryotherapy;Moist Heat;Traction;Ultrasound;Therapeutic activities;Functional mobility training;Stair training;Gait training;DME Instruction;Therapeutic exercise;Balance training;Neuromuscular re-education;Patient/family education;Manual techniques;Passive range of motion;Energy conservation;Vestibular;Taping    PT Next Visit Plan  strength, balance     PT Home Exercise Plan  see above    Consulted and Agree with Plan of Care  Patient       Patient will benefit from skilled therapeutic intervention in order to improve the following deficits and impairments:  Abnormal gait, Cardiopulmonary status limiting activity, Decreased activity  tolerance, Decreased balance, Decreased knowledge of precautions, Decreased endurance, Decreased coordination, Decreased cognition, Decreased knowledge of use of DME, Decreased mobility, Difficulty walking, Decreased safety awareness, Decreased strength, Impaired flexibility, Impaired perceived functional ability, Impaired sensation, Postural dysfunction, Improper body mechanics, Pain  Visit Diagnosis: Unsteadiness on feet  Muscle weakness (generalized)  Other abnormalities of gait and mobility  Difficulty in walking, not elsewhere classified     Problem List Patient Active Problem List   Diagnosis Date Noted  . [place]state cancer [place] 02/25/2018  . Prostatic cyst 01/15/2018  . 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 [place] 09/15/2017  . Blood in stool   . Abnormal feces   . Stricture and stenosis of esophagus   . Mild cognitive impairment 08/18/2017  . Senile purpura ([place] 04/29/2017  . Anemia 04/29/2017  . Frequent falls 04/29/2017  . Benign localized hyperplasia of prostate with urinary obstruction 07/15/2016  . Elevated PSA 07/15/2016  . Incomplete emptying of  bladder 07/15/2016  . Urge incontinence 07/15/2016  . Hypothyroidism 04/25/2016  . Macular degeneration 04/25/2016  . Erectile dysfunction due to arterial insufficiency   . Ischemic heart disease with chronotropic incompetence   . CN (constipation) 02/10/2015  . DD (diverticular disease) 02/10/2015  . Fatigue 02/10/2015  . Lichen planus 61/44/3154  . Restless leg 02/10/2015  . 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 B12 deficiency 02/10/2015  . COPD (chronic obstructive pulmonary disease) ([place]) 11/14/2014  . Post Inflammatory Lung Changes 11/14/2014  . PAF (paroxysmal atrial fibrillation) ([facility]) CHA2DS2-VASc = 4. AC = Eliquis   . Insomnia 12/09/2013  . OSA on CPAP   . Dyspnea on exertion - -essentially resolved with BB dose reduction & wgt loss 03/29/2013  . Overweight (BMI 25.0-29.9) -- Wgt back up 01/17/2013  .  Atherosclerotic heart disease of native coronary artery without angina pectoris - s/p CABG, occluded SVG-D1; patent LIMA-LAD, SVG-OM, SVG- RCA   . Essential hypertension   . Hyperlipidemia with target LDL less than 70   . Aorto-iliac disease ([place])   . BCC (basal cell carcinoma), eyelid 01/03/2013  . Allergic rhinitis 08/17/2009  . Adaptation reaction 05/28/2009  . Acid reflux 05/28/2009  . Arthritis, degenerative 05/28/2009  . 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 Carotid artery occlusion without infarction 06/01/2006  . PAD (peripheral artery disease) - bilateral common iliac stents 12/15/2005  . Aortoiliac occlusive disease ([place]) 11/30/2005  . Atherosclerotic heart disease of artery bypass graft 09/01/1997   Erick Blinks, SPT  This entire session was performed under direct supervision and direction of a licensed therapist/therapist assistant . I have personally read, edited and approve of the note as written.  Janna Arch, PT, DPT   07/14/2018, 2:43 PM  [place] MAIN [facility] SERVICES [address] [place], [place], [address] Phone: [phone]   Fax:  [phone]  Name: ZAMAR ODWYER MRN: [id number] Date of Birth: August 15, 1934

## 2018-07-14 NOTE — Telephone Encounter (Signed)
Pt advised.   Thanks,   -Laura  

## 2018-07-19 ENCOUNTER — Ambulatory Visit: Payer: Medicare Other | Admitting: Physical Therapy

## 2018-07-19 DIAGNOSIS — R262 Difficulty in walking, not elsewhere classified: Secondary | ICD-10-CM

## 2018-07-19 DIAGNOSIS — R2689 Other abnormalities of gait and mobility: Secondary | ICD-10-CM

## 2018-07-19 DIAGNOSIS — M6281 Muscle weakness (generalized): Secondary | ICD-10-CM | POA: Diagnosis not present

## 2018-07-19 DIAGNOSIS — R2681 Unsteadiness on feet: Secondary | ICD-10-CM

## 2018-07-19 NOTE — Therapy (Signed)
[place] MAIN [facility] SERVICES [address] [place], [place], [address] Phone: [phone]   Fax:  [phone]  Physical Therapy Treatment  Patient Details  Name: [name] MRN: [id number] Date of Birth: 1934/08/30 No data recorded  Encounter Date: 07/19/2018  PT End of Session - 07/19/18 1307    Visit Number  14    Number of Visits  32    Date for PT Re-Evaluation  08/30/18    Authorization Type  PN 4/10 starts 11/4    PT Start Time  1300    PT Stop Time  1340    PT Time Calculation (min)  40 min    Equipment Utilized During Treatment  Gait belt    Activity Tolerance  Patient tolerated treatment well;Patient limited by fatigue    Behavior During Therapy  [facility] for tasks assessed/performed       Past Medical History:  Diagnosis Date  . Arthritis   . Atherosclerotic heart disease of native coronary artery without angina pectoris 1998   - s/p CABG, occluded SVG-D1; patent LIMA-LAD, SVG-OM, SVG- RCA  . Basal cell carcinoma of eyelid 01/03/2013   2019 - R side of Nose  . Benign neoplasm of ascending colon   . Benign neoplasm of descending colon   . Cancer [facility])    Skin cancer  . Colon polyps   . COPD (chronic obstructive pulmonary disease) ([place])   . GERD (gastroesophageal reflux disease)   . Hemorrhoid 02/10/2015  . History of hiatal hernia   . HOH (hard of hearing)   . Hypertension   . Memory loss   . Osteoporosis   . PAF (paroxysmal atrial fibrillation) ([place])   . Prostate disorder   . Sciatic pain    Chronic  . Sleep apnea    cpap  . Temporary cerebral vascular dysfunction 02/10/2015   Had negative work-up.  July, 2012.  No medication changes, had forgot them that day, got dehydrated.     Past Surgical History:  Procedure Laterality Date  . ANKLE SURGERY    . Arch aortogram and carotid aortogram  06/02/2006   Dr Oneida Alar did surgery  . BASAL CELL CARCINOMA EXCISION  04/2016   Dermatology  . CARDIAC CATHETERIZATION   01/30/1998    (Dr. Linard Millers): Native LAD & mRCA CTO. 90% D1. Mod Cx. SVG-D1 CTO. SVG-RCA & SVG-OM along with LIMA-LAD patent;  LV NORMAL Fxn  . CAROTID ENDARTERECTOMY Left Oct. 29, 2007   Dr. Oneida Alar:  . CATARACT EXTRACTION W/PHACO Right 03/05/2017   Procedure: CATARACT EXTRACTION PHACO AND INTRAOCULAR LENS PLACEMENT (IOC);  Surgeon: Leandrew Koyanagi, MD;  Location: [facility] ORS;  Service: Ophthalmology;  Laterality: Right;  [place] 00:58.5 AP% 10.6 CDE 6.20 Fluid Pack lot # [id number]  . CATARACT EXTRACTION W/PHACO Left 04/15/2017   Procedure: CATARACT EXTRACTION PHACO AND INTRAOCULAR LENS PLACEMENT ([place])  Left;  Surgeon: Leandrew Koyanagi, MD;  Location: [place];  Service: Ophthalmology;  Laterality: Left;  . COLONOSCOPY WITH PROPOFOL N/A 09/15/2017   Procedure: COLONOSCOPY WITH PROPOFOL;  Surgeon: Lucilla Lame, MD;  Location[facility] ENDOSCOPY;  Service: Endoscopy;  Laterality: N/A;  . CORONARY ARTERY BYPASS GRAFT  1998   LIMA-LAD, SVG-OM, SVG-RPDA, SVG-DIAG  . CPET / MET  12/2012   Mild chronotropic incompetence - read 82% of predicted; also reduced effort; peak VO2 15.7 / 75% (did not reach Max effort) -- suggested ischemic response in last 1.5 minutes of exercise. Normal pulmonary function on PFTs but poor response  to  . DOPPLER ECHOCARDIOGRAPHY  11/20/2010   EF =>55%; LV norm mild aortic scelorosis  . ESOPHAGOGASTRODUODENOSCOPY (EGD) WITH PROPOFOL N/A 09/15/2017   Procedure: ESOPHAGOGASTRODUODENOSCOPY (EGD) WITH PROPOFOL;  Surgeon: Lucilla Lame, MD;  Location: [facility] ENDOSCOPY;  Service: Endoscopy;  Laterality: N/A;  . FRACTURE SURGERY Right 03/19/2015   wrist  . HIATAL HERNIA REPAIR    . ILIAC ARTERY STENT  12/15/2005   PTA and direct stenting rgt and lft common iliac arteries  . NM GATED MYOVIEW ([place] HX)  12/2017   Low Risk - no ischemia or infarct.  EF > 65%. no RWMA  . NM MYOCAR PERF WALL MOTION  11/11/2010; February 2016   a) TM STRESS----NORMAL PERFUSION ,EF 68%; b) EF 50-55%.  Normal LV function. No significant ischemia or infarction.  . ORIF ELBOW FRACTURE Right 01/01/2017   Procedure: OPEN REDUCTION INTERNAL FIXATION (ORIF) ELBOW/OLECRANON FRACTURE;  Surgeon: Hessie Knows, MD;  Location: [facility] ORS;  Service: Orthopedics;  Laterality: Right;  . ORIF WRIST FRACTURE Right 03/19/2015   Procedure: OPEN REDUCTION INTERNAL FIXATION (ORIF) WRIST FRACTURE;  Surgeon: Hessie Knows, MD;  Location: [facility] ORS;  Service: Orthopedics;  Laterality: Right;  . THORACIC AORTA - CAROTID ANGIOGRAM  October 2007   Dr. 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 TRANSTHORACIC ECHOCARDIOGRAM  February 2016   [facility]: Normal LV function. Dilated left atrium.    There were no vitals filed for this visit.  Subjective Assessment - 07/19/18 1300    Subjective  Patient states that he has nothing new to report since his last visit. Reports no falls or "scuffles" since his last visit. Patient denies any pain today.    Patient is accompained by:  Family member    Pertinent History  Patient goes 5x/week for radiation therapy for prostate cancer. Is getting weaker and no longer goes to the senior center. Just started using cane, when walks a lot his low back and legs ache. He has a significant CV history including quadruple bypass, CAD w cardiac stents, on Eliquis, left carotid endarterectomy, AAA. He is functionally independent but lives alone, his partner lives 5 minutes down the road and seems to assist him with daily living. Activity as tolerated per patient report. Has history of memory deficits.     Limitations  Walking;Standing;House hold activities;Other (comment);Lifting    How long can you sit comfortably?  n/a    How long can you stand comfortably?  needs to hold onto cane.     How long can you walk comfortably?  40 ft     Patient Stated Goals  improve stability    Currently in Pain?  No/denies    Pain Score   0-No pain    Pain Onset  In the past 7 days       TREATMENT Therapeutic Exercise  Nustep lvl 4 5 minutes >60rpm for cardiovascular challenge, VCs for maintaining speed (history taken)   Leg press 2x15. 170lbs. Verbal cues to decrease speed.  Neuromuscular Re-education   Ambulate in hallway with cane 6x80 ft. Verbal cues for speed and foot placement.   Ambulate with left/right head turns with cane. Verbal cues for speed and foot placement. 6x50f CGA   Standing upright raises with 4lb bar on airex pad for core activation and strength. CGA   Standing rotations with 4lb bar airex pad CGA. Verbal visual cues to maintain elbow extension and to use trunk and abdominal muscles  Standing airex pad toe taps 6in step with airex on top  BUE support x20, SUE, x20 CGA   Standing airex pad lateral toe taps 6inch step. x15 each LE. CGA. 1 UE support  Standing 1/2 foam roller  x20 A/P touches SUE support, x1 min balance center SUE support, x30 sec balance center EC, SUE support      PT Education - 07/19/18 1307    Education provided  Yes    Education Details  exercise technique, HEP    Person(s) Educated  Patient    Methods  Explanation;Demonstration;Verbal cues    Comprehension  Verbalized understanding;Returned demonstration;Need further instruction       PT Short Term Goals - 07/05/18 1332      PT SHORT TERM GOAL #1   Title  Patient will be independent in home exercise program to improve strength/mobility for better functional independence with ADLs.    Baseline  9/17: HEP given 11/4: compliant at this time    Time  2    Period  Weeks    Status  Partially Met      PT SHORT TERM GOAL #2   Title  Patient will deny any falls over past 2 weeks to demonstrate improved safety awareness at home and work.     Baseline  9/17: progressive weakness 11/4: denies falls last 2 weeks    Time  2    Period  Weeks    Status  Achieved      PT SHORT TERM GOAL #3   Title  Patient will increase  BLE gross strength to 4-/5 as to improve functional strength for independent gait, increased standing tolerance and increased ADL ability.    Baseline  9/17: Hip flex: L 3/5 R 3+/5 abd: 2+/5, 3-/5; add 2/5 2+/5 11/4: hip flexion L 3+/5 R 3+/5 abd 2+/5, 2+/5 add 2+/5, 2+/5    Time  2    Period  Weeks    Status  Partially Met        PT Long Term Goals - 07/05/18 1444      PT LONG TERM GOAL #1   Title  Patient will increase BLE gross strength to 4+/5 as to improve functional strength for independent gait, increased standing tolerance and increased ADL ability.    Baseline  9/17: Hip flex: L 3/5 R 3+/5 abd: 2+/5, 3-/5; add 2/5 2+/5 11/4: hip flexion L 3+/5 R 3+/5 abd 2+/5, 2+/5 add 2+/5, 2+/5    Time  8    Period  Weeks    Status  Partially Met    Target Date  08/30/18      PT LONG TERM GOAL #2   Title  Patient will increase ABC scale score >80% to demonstrate better functional mobility and better confidence with ADLs.     Baseline  9/17: 68.13% 11/4: 76.6%    Time  8    Period  Weeks    Status  Partially Met    Target Date  08/30/18      PT LONG TERM GOAL #3   Title  Patient will increase Berg Balance score by > 6 points (44/56)  to demonstrate decreased fall risk during functional activities.    Baseline  9/17: 38/56 11/4: 39/56    Time  8    Period  Weeks    Status  Partially Met    Target Date  08/30/18      PT LONG TERM GOAL #4   Title  Patient will increase six  minute walk test distance to >1000 for progression to community ambulator and improve gait ability    Baseline  9/18: 1300 ft w/o cane 11/4: 940f w/o cane    Time  8    Period  Weeks    Status  On-going    Target Date  08/30/18      PT LONG TERM GOAL #5   Title  Patient (> 655years old) will complete five times sit to stand test in < 15 seconds without UE support indicating an increased LE strength and improved balance.    Baseline  11/4: requires 1 UE support    Time  8    Period  Weeks    Status  New     Target Date  08/30/18            Plan - 07/19/18 1347    Clinical Impression Statement  Patient presented to therapy motivated to participate. Patient demonstrated deficits in safety awareness, static balance, dynamic balance during gait, BLE strength, and activity tolerance as evidenced by his need for frequent seated rest breaks between exercises, his quick transitions navigating turns during gait with noted unsteadiness w/ no LOB, and his inconsistent use of a SPC during ambulation. Patient performs all tasks within an acceptable range but requires VCs and CGA to maintain form, balance, and optimal speed of movement for increased safety. Patient will continue to benefit from skilled therapeutic intervention to address deficits, decrease risk of falls, and improve overall QOL.    Rehab Potential  Fair    Clinical Impairments Affecting Rehab Potential  (+) good support system, high prior level of function (-) currently undergoing radiation therapy for prostate cancer, memory deficits     PT Frequency  2x / week    PT Duration  8 weeks    PT Treatment/Interventions  ADLs/Self Care Home Management;Aquatic Therapy;Cryotherapy;Moist Heat;Traction;Ultrasound;Therapeutic activities;Functional mobility training;Stair training;Gait training;DME Instruction;Therapeutic exercise;Balance training;Neuromuscular re-education;Patient/family education;Manual techniques;Passive range of motion;Energy conservation;Vestibular;Taping    PT Next Visit Plan  strength, balance     PT Home Exercise Plan  see above    Consulted and Agree with Plan of Care  Patient       Patient will benefit from skilled therapeutic intervention in order to improve the following deficits and impairments:  Abnormal gait, Cardiopulmonary status limiting activity, Decreased activity tolerance, Decreased balance, Decreased knowledge of precautions, Decreased endurance, Decreased coordination, Decreased cognition, Decreased knowledge of  use of DME, Decreased mobility, Difficulty walking, Decreased safety awareness, Decreased strength, Impaired flexibility, Impaired perceived functional ability, Impaired sensation, Postural dysfunction, Improper body mechanics, Pain  Visit Diagnosis: Unsteadiness on feet  Muscle weakness (generalized)  Other abnormalities of gait and mobility  Difficulty in walking, not elsewhere classified     Problem List Patient Active Problem List   Diagnosis Date Noted  . Prostate cancer ([place] 02/25/2018  . Prostatic cyst 01/15/2018  . 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 ([place] 09/15/2017  . Blood in stool   . Abnormal feces   . Stricture and stenosis of esophagus   . Mild cognitive impairment 08/18/2017  . Senile purpura ([place] 04/29/2017  . Anemia 04/29/2017  . Frequent falls 04/29/2017  . Benign localized hyperplasia of prostate with urinary obstruction 07/15/2016  . Elevated PSA 07/15/2016  . Incomplete emptying of bladder 07/15/2016  . Urge incontinence 07/15/2016  . Hypothyroidism 04/25/2016  . Macular degeneration 04/25/2016  . Erectile dysfunction due to arterial insufficiency   . Ischemic heart disease with chronotropic incompetence   . CN (  constipation) 02/10/2015  . DD (diverticular disease) 02/10/2015  . Fatigue 02/10/2015  . Lichen planus 35/00/9381  . Restless leg 02/10/2015  . 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 B12 deficiency 02/10/2015  . COPD (chronic obstructive pulmonary disease) ([place]) 11/14/2014  . Post Inflammatory Lung Changes 11/14/2014  . PAF (paroxysmal atrial fibrillation) ([facility]) CHA2DS2-VASc = 4. AC = Eliquis   . Insomnia 12/09/2013  . OSA on CPAP   . Dyspnea on exertion - -essentially resolved with BB dose reduction & wgt loss 03/29/2013  . Overweight (BMI 25.0-29.9) -- Wgt back up 01/17/2013  . Atherosclerotic heart disease of native coronary artery without angina pectoris - s/p CABG, occluded SVG-D1; patent LIMA-LAD, SVG-OM, SVG- RCA   . Essential  hypertension   . Hyperlipidemia with target LDL less than 70   . Aorto-iliac disease ([place])   . BCC (basal cell carcinoma), eyelid 01/03/2013  . Allergic rhinitis 08/17/2009  . Adaptation reaction 05/28/2009  . Acid reflux 05/28/2009  . Arthritis, degenerative 05/28/2009  . 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 Carotid artery occlusion without infarction 06/01/2006  . PAD (peripheral artery disease) - bilateral common iliac stents 12/15/2005  . Aortoiliac occlusive disease ([place]) 11/30/2005  . Atherosclerotic heart disease of artery bypass graft 09/01/1997   Myles Gip PT, DPT [id number] 07/19/2018, 1:52 PM  [place] MAIN [facility] SERVICES [address] [place], [place], [address] Phone: [phone]   Fax:  [phone]  Name: [name] MRN: [id number] Date of Birth: 08/12/1934

## 2018-07-21 ENCOUNTER — Ambulatory Visit: Payer: Medicare Other

## 2018-07-21 ENCOUNTER — Other Ambulatory Visit (HOSPITAL_BASED_OUTPATIENT_CLINIC_OR_DEPARTMENT_OTHER): Payer: Medicare Other

## 2018-07-21 DIAGNOSIS — M6281 Muscle weakness (generalized): Secondary | ICD-10-CM

## 2018-07-21 DIAGNOSIS — R2689 Other abnormalities of gait and mobility: Secondary | ICD-10-CM

## 2018-07-21 DIAGNOSIS — R2681 Unsteadiness on feet: Secondary | ICD-10-CM

## 2018-07-21 DIAGNOSIS — R262 Difficulty in walking, not elsewhere classified: Secondary | ICD-10-CM | POA: Diagnosis not present

## 2018-07-21 NOTE — Therapy (Addendum)
[place] MAIN [facility] SERVICES [address] [place], [place], [address] Phone: [phone]   Fax:  [phone]  Physical Therapy Treatment  Patient Details  Name: [name] MRN: [id number] Date of Birth: [birth year] No data recorded  Encounter Date: 07/21/2018  PT End of Session - 07/21/18 1245    Visit Number  15    Number of Visits  32    Date for PT Re-Evaluation  08/30/18    Authorization Type  PN 5/10 starts 11/4    PT Start Time  1300    PT Stop Time  1345    PT Time Calculation (min)  45 min    Equipment Utilized During Treatment  Gait belt    Activity Tolerance  Patient tolerated treatment well;Patient limited by fatigue    Behavior During Therapy  [facility] for tasks assessed/performed       Past Medical History:  Diagnosis Date  . Arthritis   . Atherosclerotic heart disease of native coronary artery without angina pectoris 1998   - s/p CABG, occluded SVG-D1; patent LIMA-LAD, SVG-OM, SVG- RCA  . Basal cell carcinoma of eyelid 01/03/2013   2019 - R side of Nose  . Benign neoplasm of ascending colon   . Benign neoplasm of descending colon   . Cancer [facility])    Skin cancer  . Colon polyps   . COPD (chronic obstructive pulmonary disease) ([place])   . GERD (gastroesophageal reflux disease)   . Hemorrhoid 02/10/2015  . History of hiatal hernia   . HOH (hard of hearing)   . Hypertension   . Memory loss   . Osteoporosis   . PAF (paroxysmal atrial fibrillation) ([place])   . Prostate disorder   . Sciatic pain    Chronic  . Sleep apnea    cpap  . Temporary cerebral vascular dysfunction 02/10/2015   Had negative work-up.  July, 2012.  No medication changes, had forgot them that day, got dehydrated.     Past Surgical History:  Procedure Laterality Date  . ANKLE SURGERY    . Arch aortogram and carotid aortogram  06/02/2006   Dr Oneida Alar did surgery  . BASAL CELL CARCINOMA EXCISION  04/2016   Dermatology  . CARDIAC CATHETERIZATION   01/30/1998    (Dr. Linard Millers): Native LAD & mRCA CTO. 90% D1. Mod Cx. SVG-D1 CTO. SVG-RCA & SVG-OM along with LIMA-LAD patent;  LV NORMAL Fxn  . CAROTID ENDARTERECTOMY Left Oct. 29, 2007   Dr. Oneida Alar:  . CATARACT EXTRACTION W/PHACO Right 03/05/2017   Procedure: CATARACT EXTRACTION PHACO AND INTRAOCULAR LENS PLACEMENT (IOC);  Surgeon: Leandrew Koyanagi, MD;  Location: [facility] ORS;  Service: Ophthalmology;  Laterality: Right;  [place] 00:58.5 AP% 10.6 CDE 6.20 Fluid Pack lot # [id number]  . CATARACT EXTRACTION W/PHACO Left 04/15/2017   Procedure: CATARACT EXTRACTION PHACO AND INTRAOCULAR LENS PLACEMENT ([place])  Left;  Surgeon: Leandrew Koyanagi, MD;  Location: [place];  Service: Ophthalmology;  Laterality: Left;  . COLONOSCOPY WITH PROPOFOL N/A 09/15/2017   Procedure: COLONOSCOPY WITH PROPOFOL;  Surgeon: Lucilla Lame, MD;  Location: [facility] ENDOSCOPY;  Service: Endoscopy;  Laterality: N/A;  . CORONARY ARTERY BYPASS GRAFT  1998   LIMA-LAD, SVG-OM, SVG-RPDA, SVG-DIAG  . CPET / MET  12/2012   Mild chronotropic incompetence - read 82% of predicted; also reduced effort; peak VO2 15.7 / 75% (did not reach Max effort) -- suggested ischemic response in last 1.5 minutes of exercise. Normal pulmonary function on PFTs but poor response  to  . DOPPLER ECHOCARDIOGRAPHY  11/20/2010   EF =>55%; LV norm mild aortic scelorosis  . ESOPHAGOGASTRODUODENOSCOPY (EGD) WITH PROPOFOL N/A 09/15/2017   Procedure: ESOPHAGOGASTRODUODENOSCOPY (EGD) WITH PROPOFOL;  Surgeon: Lucilla Lame, MD;  Location: [facility] ENDOSCOPY;  Service: Endoscopy;  Laterality: N/A;  . FRACTURE SURGERY Right 03/19/2015   wrist  . HIATAL HERNIA REPAIR    . ILIAC ARTERY STENT  12/15/2005   PTA and direct stenting rgt and lft common iliac arteries  . NM GATED MYOVIEW ([place] [facility])  12/2017   Low Risk - no ischemia or infarct.  EF > 65%. no RWMA  . NM MYOCAR PERF WALL MOTION  11/11/2010; February 2016   a) TM STRESS----NORMAL PERFUSION ,EF 68%; b) EF 50-55%.  Normal LV function. No significant ischemia or infarction.  . ORIF ELBOW FRACTURE Right 01/01/2017   Procedure: OPEN REDUCTION INTERNAL FIXATION (ORIF) ELBOW/OLECRANON FRACTURE;  Surgeon: Hessie Knows, MD;  Location: [facility] ORS;  Service: Orthopedics;  Laterality: Right;  . ORIF WRIST FRACTURE Right 03/19/2015   Procedure: OPEN REDUCTION INTERNAL FIXATION (ORIF) WRIST FRACTURE;  Surgeon: Hessie Knows, MD;  Location: [facility] ORS;  Service: Orthopedics;  Laterality: Right;  . THORACIC AORTA - CAROTID ANGIOGRAM  October 2007   Dr. 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 TRANSTHORACIC ECHOCARDIOGRAM  February 2016   [facility]: Normal LV function. Dilated left atrium.    There were no vitals filed for this visit.  Subjective Assessment - 07/21/18 1300    Subjective  Patient states he is doing well. States no falls or LOB since last session. Denies current pain. Reports exercises are going well.     Patient is accompained by:  Family member    Pertinent History  Patient goes 5x/week for radiation therapy for prostate cancer. Is getting weaker and no longer goes to the senior center. Just started using cane, when walks a lot his low back and legs ache. He has a significant CV history including quadruple bypass, CAD w cardiac stents, on Eliquis, left carotid endarterectomy, AAA. He is functionally independent but lives alone, his partner lives 5 minutes down the road and seems to assist him with daily living. Activity as tolerated per patient report. Has history of memory deficits.     Limitations  Walking;Standing;House hold activities;Other (comment);Lifting    How long can you sit comfortably?  n/a    How long can you stand comfortably?  needs to hold onto cane.     How long can you walk comfortably?  40 ft     Patient Stated Goals  improve stability    Currently in Pain?  No/denies    Pain Onset  In the past 7 days          Therapeutic Exercise  Nustep lvl 4 5 minutes >60rpm for cardiovascular challenge, VCs for maintaining speed    Leg press 2x10. 180lbs. Verbal cues to decrease speed.    Neuromuscular Re-education   Ambulate with left/right head turns with cane naming red/black playing cards. Verbal cues for speed and foot placement. 6x68f CGA  In //bars  Ambulate on red mat with objects underneath to simulate ambulation on uneven surfaces. X5length of bars. 1 UE support. CGA   Standing upright raises with 4lb bar on airex pad for core activation and strength. CGA   Standing rotations with 4lb bar airex pad CGA. Verbal visual cues to maintain elbow extension and to use trunk and abdominal  muscles   6" step stepups; SUE support progressed no UE support . Shooting basketball at top of step prior to step down CGA  Standing airex pad toe taps 6in step with airex on top  BUE support x20, SUE, x20 CGA  Standing on airex pad tapping 3 cones. Verbal cues to control movement and not stomp object. CGA 1 UE support.    Standing 1/2 foam roller (flat side up) x20 A/P touches SUE support  Standing on half foam roller (flat side down) x1 min balance center SUE support with occasional no support, x30 sec balance center EC, SUE support                          PT Education - 07/21/18 1245    Education provided  Yes    Education Details  exercise technique, HEP    Person(s) Educated  Patient    Methods  Explanation;Demonstration;Verbal cues    Comprehension  Verbalized understanding;Returned demonstration       PT Short Term Goals - 07/05/18 1332      PT SHORT TERM GOAL #1   Title  Patient will be independent in home exercise program to improve strength/mobility for better functional independence with ADLs.    Baseline  9/17: HEP given 11/4: compliant at this time    Time  2    Period  Weeks    Status  Partially Met      PT SHORT TERM GOAL #2   Title  Patient will deny  any falls over past 2 weeks to demonstrate improved safety awareness at home and work.     Baseline  9/17: progressive weakness 11/4: denies falls last 2 weeks    Time  2    Period  Weeks    Status  Achieved      PT SHORT TERM GOAL #3   Title  Patient will increase BLE gross strength to 4-/5 as to improve functional strength for independent gait, increased standing tolerance and increased ADL ability.    Baseline  9/17: Hip flex: L 3/5 R 3+/5 abd: 2+/5, 3-/5; add 2/5 2+/5 11/4: hip flexion L 3+/5 R 3+/5 abd 2+/5, 2+/5 add 2+/5, 2+/5    Time  2    Period  Weeks    Status  Partially Met        PT Long Term Goals - 07/05/18 1444      PT LONG TERM GOAL #1   Title  Patient will increase BLE gross strength to 4+/5 as to improve functional strength for independent gait, increased standing tolerance and increased ADL ability.    Baseline  9/17: Hip flex: L 3/5 R 3+/5 abd: 2+/5, 3-/5; add 2/5 2+/5 11/4: hip flexion L 3+/5 R 3+/5 abd 2+/5, 2+/5 add 2+/5, 2+/5    Time  8    Period  Weeks    Status  Partially Met    Target Date  08/30/18      PT LONG TERM GOAL #2   Title  Patient will increase ABC scale score >80% to demonstrate better functional mobility and better confidence with ADLs.     Baseline  9/17: 68.13% 11/4: 76.6%    Time  8    Period  Weeks    Status  Partially Met    Target Date  08/30/18      PT LONG TERM GOAL #3   Title  Patient will increase Berg Balance score by > 6  points (44/56)  to demonstrate decreased fall risk during functional activities.    Baseline  9/17: 38/56 11/4: 39/56    Time  8    Period  Weeks    Status  Partially Met    Target Date  08/30/18      PT LONG TERM GOAL #4   Title  Patient will increase six minute walk test distance to >1000 for progression to community ambulator and improve gait ability    Baseline  9/18: 1300 ft w/o cane 11/4: 956f w/o cane    Time  8    Period  Weeks    Status  On-going    Target Date  08/30/18      PT LONG TERM  GOAL #5   Title  Patient (> 667years old) will complete five times sit to stand test in < 15 seconds without UE support indicating an increased LE strength and improved balance.    Baseline  11/4: requires 1 UE support    Time  8    Period  Weeks    Status  New    Target Date  08/30/18            Plan - 07/21/18 1352    Clinical Impression Statement   Patient had increased difficulty with ambulating naming playing cards with gait deviations and limit use of cane. He was unable to complete with 100% accuracy and had significant gait speed deviations due to dynamic balance challenge. Increased weight of leg press demonstrating improved LE strength. Patient able to complete step up without UE support but requires cueing to not use hands. Discussed patient interested in completing exercises in gym to prepare for possible return to gym workouts. Patient will continue to benefit from skilled physical therapy to improve strength, balance, and functional mobility.     Rehab Potential  Fair    Clinical Impairments Affecting Rehab Potential  (+) good support system, high prior level of function (-) currently undergoing radiation therapy for prostate cancer, memory deficits     PT Frequency  2x / week    PT Duration  8 weeks    PT Treatment/Interventions  ADLs/Self Care Home Management;Aquatic Therapy;Cryotherapy;Moist Heat;Traction;Ultrasound;Therapeutic activities;Functional mobility training;Stair training;Gait training;DME Instruction;Therapeutic exercise;Balance training;Neuromuscular re-education;Patient/family education;Manual techniques;Passive range of motion;Energy conservation;Vestibular;Taping    PT Next Visit Plan  strength, balance     PT Home Exercise Plan  see above    Consulted and Agree with Plan of Care  Patient       Patient will benefit from skilled therapeutic intervention in order to improve the following deficits and impairments:  Abnormal gait, Cardiopulmonary status  limiting activity, Decreased activity tolerance, Decreased balance, Decreased knowledge of precautions, Decreased endurance, Decreased coordination, Decreased cognition, Decreased knowledge of use of DME, Decreased mobility, Difficulty walking, Decreased safety awareness, Decreased strength, Impaired flexibility, Impaired perceived functional ability, Impaired sensation, Postural dysfunction, Improper body mechanics, Pain  Visit Diagnosis: Unsteadiness on feet  Muscle weakness (generalized)  Other abnormalities of gait and mobility  Difficulty in walking, not elsewhere classified     Problem List Patient Active Problem List   Diagnosis Date Noted  . Prostate cancer ([place] 02/25/2018  . Prostatic cyst 01/15/2018  . 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 ([place] 09/15/2017  . Blood in stool   . Abnormal feces   . Stricture and stenosis of esophagus   . Mild cognitive impairment 08/18/2017  . Senile purpura [place] 04/29/2017  . Anemia 04/29/2017  . Frequent falls 04/29/2017  .  Benign localized hyperplasia of prostate with urinary obstruction 07/15/2016  . Elevated PSA 07/15/2016  . Incomplete emptying of bladder 07/15/2016  . Urge incontinence 07/15/2016  . Hypothyroidism 04/25/2016  . Macular degeneration 04/25/2016  . Erectile dysfunction due to arterial insufficiency   . Ischemic heart disease with chronotropic incompetence   . CN (constipation) 02/10/2015  . DD (diverticular disease) 02/10/2015  . Fatigue 02/10/2015  . Lichen planus 59/09/7239  . Restless leg 02/10/2015  . 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 B12 deficiency 02/10/2015  . COPD (chronic obstructive pulmonary disease) ([place]) 11/14/2014  . Post Inflammatory Lung Changes 11/14/2014  . PAF (paroxysmal atrial fibrillation) ([facility]) CHA2DS2-VASc = 4. AC = Eliquis   . Insomnia 12/09/2013  . OSA on CPAP   . Dyspnea on exertion - -essentially resolved with BB dose reduction & wgt loss 03/29/2013  . Overweight (BMI  25.0-29.9) -- Wgt back up 01/17/2013  . Atherosclerotic heart disease of native coronary artery without angina pectoris - s/p CABG, occluded SVG-D1; patent LIMA-LAD, SVG-OM, SVG- RCA   . Essential hypertension   . Hyperlipidemia with target LDL less than 70   . Aorto-iliac disease ([place])   . BCC (basal cell carcinoma), eyelid 01/03/2013  . Allergic rhinitis 08/17/2009  . Adaptation reaction 05/28/2009  . Acid reflux 05/28/2009  . Arthritis, degenerative 05/28/2009  . 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 Carotid artery occlusion without infarction 06/01/2006  . PAD (peripheral artery disease) - bilateral common iliac stents 12/15/2005  . Aortoiliac occlusive disease ([place]) 11/30/2005  . Atherosclerotic heart disease of artery bypass graft 09/01/1997   Erick Blinks, SPT  This entire session was performed under direct supervision and direction of a licensed therapist/therapist assistant . I have personally read, edited and approve of the note as written.  Janna Arch, PT, DPT   07/21/2018, 2:17 PM  [place] MAIN [facility] SERVICES [address] [place], [place], [address] Phone: [phone]   Fax:  [phone]  Name: [name] MRN: [id number] Date of Birth: [birth year]

## 2018-07-26 ENCOUNTER — Ambulatory Visit: Payer: Medicare Other

## 2018-07-26 DIAGNOSIS — R2681 Unsteadiness on feet: Secondary | ICD-10-CM

## 2018-07-26 DIAGNOSIS — R2689 Other abnormalities of gait and mobility: Secondary | ICD-10-CM

## 2018-07-26 DIAGNOSIS — M6281 Muscle weakness (generalized): Secondary | ICD-10-CM

## 2018-07-26 DIAGNOSIS — R262 Difficulty in walking, not elsewhere classified: Secondary | ICD-10-CM

## 2018-07-26 NOTE — Therapy (Addendum)
[place] MAIN [facility] SERVICES [address] [place], [place], [address] Phone: [phone]   Fax:  [phone]  Physical Therapy Treatment  Patient Details  Name: [name] MRN: [id number] Date of Birth: September 29, 1933 No data recorded  Encounter Date: 07/26/2018  PT End of Session - 07/26/18 1305    Visit Number  16    Number of Visits  32    Date for PT Re-Evaluation  08/30/18    Authorization Type  PN 6/10 starts 11/4    PT Start Time  1300    PT Stop Time  1345    PT Time Calculation (min)  45 min    Equipment Utilized During Treatment  Gait belt    Activity Tolerance  Patient tolerated treatment well;Patient limited by fatigue    Behavior During Therapy  [facility] for tasks assessed/performed       Past Medical History:  Diagnosis Date  . Arthritis   . Atherosclerotic heart disease of native coronary artery without angina pectoris 1998   - s/p CABG, occluded SVG-D1; patent LIMA-LAD, SVG-OM, SVG- RCA  . Basal cell carcinoma of eyelid 01/03/2013   2019 - R side of Nose  . Benign neoplasm of ascending colon   . Benign neoplasm of descending colon   . Cancer [facility])    Skin cancer  . Colon polyps   . COPD (chronic obstructive pulmonary disease) ([place])   . GERD (gastroesophageal reflux disease)   . Hemorrhoid 02/10/2015  . History of hiatal hernia   . HOH (hard of hearing)   . Hypertension   . Memory loss   . Osteoporosis   . PAF (paroxysmal atrial fibrillation) ([place])   . Prostate disorder   . Sciatic pain    Chronic  . Sleep apnea    cpap  . Temporary cerebral vascular dysfunction 02/10/2015   Had negative work-up.  July, 2012.  No medication changes, had forgot them that day, got dehydrated.     Past Surgical History:  Procedure Laterality Date  . ANKLE SURGERY    . Arch aortogram and carotid aortogram  06/02/2006   Dr Oneida Alar did surgery  . BASAL CELL CARCINOMA EXCISION  04/2016   Dermatology  . CARDIAC CATHETERIZATION   01/30/1998    (Dr. Linard Millers): Native LAD & mRCA CTO. 90% D1. Mod Cx. SVG-D1 CTO. SVG-RCA & SVG-OM along with LIMA-LAD patent;  LV NORMAL Fxn  . CAROTID ENDARTERECTOMY Left Oct. 29, 2007   Dr. Oneida Alar:  . CATARACT EXTRACTION W/PHACO Right 03/05/2017   Procedure: CATARACT EXTRACTION PHACO AND INTRAOCULAR LENS PLACEMENT (IOC);  Surgeon: Leandrew Koyanagi, MD;  Location: [facility] ORS;  Service: Ophthalmology;  Laterality: Right;  [place] 00:58.5 AP% 10.6 CDE 6.20 Fluid Pack lot # [id number]  . CATARACT EXTRACTION W/PHACO Left 04/15/2017   Procedure: CATARACT EXTRACTION PHACO AND INTRAOCULAR LENS PLACEMENT ([place])  Left;  Surgeon: Leandrew Koyanagi, MD;  Location: [place];  Service: Ophthalmology;  Laterality: Left;  . COLONOSCOPY WITH PROPOFOL N/A 09/15/2017   Procedure: COLONOSCOPY WITH PROPOFOL;  Surgeon: Lucilla Lame, MD;  Location: [facility] ENDOSCOPY;  Service: Endoscopy;  Laterality: N/A;  . CORONARY ARTERY BYPASS GRAFT  1998   LIMA-LAD, SVG-OM, SVG-RPDA, SVG-DIAG  . CPET / MET  12/2012   Mild chronotropic incompetence - read 82% of predicted; also reduced effort; peak VO2 15.7 / 75% (did not reach Max effort) -- suggested ischemic response in last 1.5 minutes of exercise. Normal pulmonary function on PFTs but poor response  to  . DOPPLER ECHOCARDIOGRAPHY  11/20/2010   EF =>55%; LV norm mild aortic scelorosis  . ESOPHAGOGASTRODUODENOSCOPY (EGD) WITH PROPOFOL N/A 09/15/2017   Procedure: ESOPHAGOGASTRODUODENOSCOPY (EGD) WITH PROPOFOL;  Surgeon: Lucilla Lame, MD;  Location: [facility] ENDOSCOPY;  Service: Endoscopy;  Laterality: N/A;  . FRACTURE SURGERY Right 03/19/2015   wrist  . HIATAL HERNIA REPAIR    . ILIAC ARTERY STENT  12/15/2005   PTA and direct stenting rgt and lft common iliac arteries  . NM GATED MYOVIEW ([place] [facility])  12/2017   Low Risk - no ischemia or infarct.  EF > 65%. no RWMA  . NM MYOCAR PERF WALL MOTION  11/11/2010; February 2016   a) TM STRESS----NORMAL PERFUSION ,EF 68%; b) EF 50-55%.  Normal LV function. No significant ischemia or infarction.  . ORIF ELBOW FRACTURE Right 01/01/2017   Procedure: OPEN REDUCTION INTERNAL FIXATION (ORIF) ELBOW/OLECRANON FRACTURE;  Surgeon: Hessie Knows, MD;  Location: [facility] ORS;  Service: Orthopedics;  Laterality: Right;  . ORIF WRIST FRACTURE Right 03/19/2015   Procedure: OPEN REDUCTION INTERNAL FIXATION (ORIF) WRIST FRACTURE;  Surgeon: Hessie Knows, MD;  Location: [facility] ORS;  Service: Orthopedics;  Laterality: Right;  . THORACIC AORTA - CAROTID ANGIOGRAM  October 2007   Dr. 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 TRANSTHORACIC ECHOCARDIOGRAM  February 2016   [facility]: Normal LV function. Dilated left atrium.    There were no vitals filed for this visit.  Subjective Assessment - 07/26/18 1300    Subjective  Patient states his weekend was good. Denies any falls or stumbles since last session. States exercises are going good. Reports he use to use recumbent bike, arm pull down marchines, leg press, and leg kickout at the gym.     Patient is accompained by:  Family member    Pertinent History  Patient goes 5x/week for radiation therapy for prostate cancer. Is getting weaker and no longer goes to the senior center. Just started using cane, when walks a lot his low back and legs ache. He has a significant CV history including quadruple bypass, CAD w cardiac stents, on Eliquis, left carotid endarterectomy, AAA. He is functionally independent but lives alone, his partner lives 5 minutes down the road and seems to assist him with daily living. Activity as tolerated per patient report. Has history of memory deficits.     Limitations  Walking;Standing;House hold activities;Other (comment);Lifting    How long can you sit comfortably?  n/a    How long can you stand comfortably?  needs to hold onto cane.     How long can you walk comfortably?  40 ft     Patient Stated Goals   improve stability    Currently in Pain?  No/denies    Pain Onset  In the past 7 days            Nustep lvl 4 5 minutes >60rpm for cardiovascular challenge, VCs for maintaining speed    Leg press 2x10.Verbal cues to decrease speed. Plate 9. Plate 10 2nd set  Knee extension machine 2x10 BLE. Verbal cues to complete slowly. Cues for exercise technique. Plate 5  Hamstring curls 2x10 BLE. Verbal cues to complete slowly. Cues for exercise technique. Plate 5    In //bars     4" step lateral stepups; SUE support . Verbal cues for use of only 1 UE and to maintain neutral LE alignment. Verbal cues to take large step to allow  room for foot placement of opposite LE. Shooting basketball at top of step prior to step down CGA  Standing upright raises with 4lb bar on airex pad for core activation and strength. CGA   Standing rotations with 4lb bar airex pad CGA. Verbal visual cues to maintain elbow extension and to use trunk and abdominal muscles  Standing on airex pad bicep curls 5lb weights BUE, CGA  Standing 1/2 foam roller (flat side up) x20 A/P touches SUE support faded to no UE support and occasional use of hands    Cues for appropriate form and exercise technique throughout session.                     PT Education - 07/26/18 1253    Education provided  Yes    Education Details  exercise technique, HEP, gym routine    Person(s) Educated  Patient    Methods  Explanation;Demonstration;Verbal cues    Comprehension  Verbalized understanding       PT Short Term Goals - 07/05/18 1332      PT SHORT TERM GOAL #1   Title  Patient will be independent in home exercise program to improve strength/mobility for better functional independence with ADLs.    Baseline  9/17: HEP given 11/4: compliant at this time    Time  2    Period  Weeks    Status  Partially Met      PT SHORT TERM GOAL #2   Title  Patient will deny any falls over past 2 weeks to demonstrate improved  safety awareness at home and work.     Baseline  9/17: progressive weakness 11/4: denies falls last 2 weeks    Time  2    Period  Weeks    Status  Achieved      PT SHORT TERM GOAL #3   Title  Patient will increase BLE gross strength to 4-/5 as to improve functional strength for independent gait, increased standing tolerance and increased ADL ability.    Baseline  9/17: Hip flex: L 3/5 R 3+/5 abd: 2+/5, 3-/5; add 2/5 2+/5 11/4: hip flexion L 3+/5 R 3+/5 abd 2+/5, 2+/5 add 2+/5, 2+/5    Time  2    Period  Weeks    Status  Partially Met        PT Long Term Goals - 07/05/18 1444      PT LONG TERM GOAL #1   Title  Patient will increase BLE gross strength to 4+/5 as to improve functional strength for independent gait, increased standing tolerance and increased ADL ability.    Baseline  9/17: Hip flex: L 3/5 R 3+/5 abd: 2+/5, 3-/5; add 2/5 2+/5 11/4: hip flexion L 3+/5 R 3+/5 abd 2+/5, 2+/5 add 2+/5, 2+/5    Time  8    Period  Weeks    Status  Partially Met    Target Date  08/30/18      PT LONG TERM GOAL #2   Title  Patient will increase ABC scale score >80% to demonstrate better functional mobility and better confidence with ADLs.     Baseline  9/17: 68.13% 11/4: 76.6%    Time  8    Period  Weeks    Status  Partially Met    Target Date  08/30/18      PT LONG TERM GOAL #3   Title  Patient will increase Berg Balance score by > 6 points (44/56)  to demonstrate  decreased fall risk during functional activities.    Baseline  9/17: 38/56 11/4: 39/56    Time  8    Period  Weeks    Status  Partially Met    Target Date  08/30/18      PT LONG TERM GOAL #4   Title  Patient will increase six minute walk test distance to >1000 for progression to community ambulator and improve gait ability    Baseline  9/18: 1300 ft w/o cane 11/4: 913f w/o cane    Time  8    Period  Weeks    Status  On-going    Target Date  08/30/18      PT LONG TERM GOAL #5   Title  Patient (> 664years old) will  complete five times sit to stand test in < 15 seconds without UE support indicating an increased LE strength and improved balance.    Baseline  11/4: requires 1 UE support    Time  8    Period  Weeks    Status  New    Target Date  08/30/18            Plan - 07/26/18 1358    Clinical Impression Statement  Patient tolerated treatment session well with the incorporation of exercise machines and equipment to improve LE strength and initiate return to gym. Patient required cues for technique and to decrease speed of movement to improve eccentric muscle contraction. Patient challenged with bicep curls on airex pad due to general weakness and fatigue. Patient will continue to benefit from skilled physical therapy to improve strength, balance, and functional mobility.     Rehab Potential  Fair    Clinical Impairments Affecting Rehab Potential  (+) good support system, high prior level of function (-) currently undergoing radiation therapy for prostate cancer, memory deficits     PT Frequency  2x / week    PT Duration  8 weeks    PT Treatment/Interventions  ADLs/Self Care Home Management;Aquatic Therapy;Cryotherapy;Moist Heat;Traction;Ultrasound;Therapeutic activities;Functional mobility training;Stair training;Gait training;DME Instruction;Therapeutic exercise;Balance training;Neuromuscular re-education;Patient/family education;Manual techniques;Passive range of motion;Energy conservation;Vestibular;Taping    PT Next Visit Plan  strength, balance     PT Home Exercise Plan  see above    Consulted and Agree with Plan of Care  Patient       Patient will benefit from skilled therapeutic intervention in order to improve the following deficits and impairments:  Abnormal gait, Cardiopulmonary status limiting activity, Decreased activity tolerance, Decreased balance, Decreased knowledge of precautions, Decreased endurance, Decreased coordination, Decreased cognition, Decreased knowledge of use of DME,  Decreased mobility, Difficulty walking, Decreased safety awareness, Decreased strength, Impaired flexibility, Impaired perceived functional ability, Impaired sensation, Postural dysfunction, Improper body mechanics, Pain  Visit Diagnosis: Unsteadiness on feet  Muscle weakness (generalized)  Other abnormalities of gait and mobility  Difficulty in walking, not elsewhere classified     Problem List Patient Active Problem List   Diagnosis Date Noted  . Prostate cancer ([place] 02/25/2018  . Prostatic cyst 01/15/2018  . 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 ([place] 09/15/2017  . Blood in stool   . Abnormal feces   . Stricture and stenosis of esophagus   . Mild cognitive impairment 08/18/2017  . Senile purpura ([place] 04/29/2017  . Anemia 04/29/2017  . Frequent falls 04/29/2017  . Benign localized hyperplasia of prostate with urinary obstruction 07/15/2016  . Elevated PSA 07/15/2016  . Incomplete emptying of bladder 07/15/2016  . Urge incontinence 07/15/2016  . Hypothyroidism 04/25/2016  .  Macular degeneration 04/25/2016  . Erectile dysfunction due to arterial insufficiency   . Ischemic heart disease with chronotropic incompetence   . CN (constipation) 02/10/2015  . DD (diverticular disease) 02/10/2015  . Fatigue 02/10/2015  . Lichen planus 29/51/8841  . Restless leg 02/10/2015  . 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 B12 deficiency 02/10/2015  . COPD (chronic obstructive pulmonary disease) ([place]) 11/14/2014  . Post Inflammatory Lung Changes 11/14/2014  . PAF (paroxysmal atrial fibrillation) ([facility]) CHA2DS2-VASc = 4. AC = Eliquis   . Insomnia 12/09/2013  . OSA on CPAP   . Dyspnea on exertion - -essentially resolved with BB dose reduction & wgt loss 03/29/2013  . Overweight (BMI 25.0-29.9) -- Wgt back up 01/17/2013  . Atherosclerotic heart disease of native coronary artery without angina pectoris - s/p CABG, occluded SVG-D1; patent LIMA-LAD, SVG-OM, SVG- RCA   . Essential hypertension    . Hyperlipidemia with target LDL less than 70   . Aorto-iliac disease ([place])   . BCC (basal cell carcinoma), eyelid 01/03/2013  . Allergic rhinitis 08/17/2009  . Adaptation reaction 05/28/2009  . Acid reflux 05/28/2009  . Arthritis, degenerative 05/28/2009  . 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 Carotid artery occlusion without infarction 06/01/2006  . PAD (peripheral artery disease) - bilateral common iliac stents 12/15/2005  . Aortoiliac occlusive disease ([place]) 11/30/2005  . Atherosclerotic heart disease of artery bypass graft 09/01/1997   Erick Blinks, SPT This entire session was performed under direct supervision and direction of a licensed therapist/therapist assistant . I have personally read, edited and approve of the note as written.  Janna Arch, PT, DPT   07/26/2018, 2:36 PM  [place] MAIN [facility] SERVICES [address] [place], [place], [address] Phone: [phone]   Fax:  [phone]  Name: SIDDIQ KALUZNY MRN: [id number] Date of Birth: Dec 19, 1933

## 2018-07-28 ENCOUNTER — Ambulatory Visit: Payer: Medicare Other

## 2018-07-28 DIAGNOSIS — R2681 Unsteadiness on feet: Secondary | ICD-10-CM

## 2018-07-28 DIAGNOSIS — R2689 Other abnormalities of gait and mobility: Secondary | ICD-10-CM

## 2018-07-28 DIAGNOSIS — R262 Difficulty in walking, not elsewhere classified: Secondary | ICD-10-CM | POA: Diagnosis not present

## 2018-07-28 DIAGNOSIS — M6281 Muscle weakness (generalized): Secondary | ICD-10-CM

## 2018-07-28 NOTE — Therapy (Signed)
[place] MAIN [facility] SERVICES [address] [place], [place], [address] Phone: [phone]   Fax:  [phone]  Physical Therapy Treatment  Patient Details  Name: [name] MRN: [id number] Date of Birth: Jan 13, 1934 No data recorded  Encounter Date: 07/28/2018  PT End of Session - 07/28/18 1305    Visit Number  17    Number of Visits  32    Date for PT Re-Evaluation  08/30/18    Authorization Type  PN 7/10 starts 11/4    PT Start Time  1300    PT Stop Time  1345    PT Time Calculation (min)  45 min    Equipment Utilized During Treatment  Gait belt    Activity Tolerance  Patient tolerated treatment well;Patient limited by fatigue    Behavior During Therapy  WFL for tasks assessed/performed       Past Medical History:  Diagnosis Date  . Arthritis   . Atherosclerotic heart disease of native coronary artery without angina pectoris 1998   - s/p CABG, occluded SVG-D1; patent LIMA-LAD, SVG-OM, SVG- RCA  . Basal cell carcinoma of eyelid 01/03/2013   2019 - R side of Nose  . Benign neoplasm of ascending colon   . Benign neoplasm of descending colon   . Cancer [facility])    Skin cancer  . Colon polyps   . COPD (chronic obstructive pulmonary disease) ([place])   . GERD (gastroesophageal reflux disease)   . Hemorrhoid 02/10/2015  . History of hiatal hernia   . HOH (hard of hearing)   . Hypertension   . Memory loss   . Osteoporosis   . PAF (paroxysmal atrial fibrillation) ([place])   . Prostate disorder   . Sciatic pain    Chronic  . Sleep apnea    cpap  . Temporary cerebral vascular dysfunction 02/10/2015   Had negative work-up.  July, 2012.  No medication changes, had forgot them that day, got dehydrated.     Past Surgical History:  Procedure Laterality Date  . ANKLE SURGERY    . Arch aortogram and carotid aortogram  06/02/2006   Dr Oneida Alar did surgery  . BASAL CELL CARCINOMA EXCISION  04/2016   Dermatology  . CARDIAC CATHETERIZATION   01/30/1998    (Dr. Linard Millers): Native LAD & mRCA CTO. 90% D1. Mod Cx. SVG-D1 CTO. SVG-RCA & SVG-OM along with LIMA-LAD patent;  LV NORMAL Fxn  . CAROTID ENDARTERECTOMY Left Oct. 29, 2007   Dr. Oneida Alar:  . CATARACT EXTRACTION W/PHACO Right 03/05/2017   Procedure: CATARACT EXTRACTION PHACO AND INTRAOCULAR LENS PLACEMENT (IOC);  Surgeon: Leandrew Koyanagi, MD;  Location: [facility] ORS;  Service: Ophthalmology;  Laterality: Right;  [place] 00:58.5 AP% 10.6 CDE 6.20 Fluid Pack lot # [id number]  . CATARACT EXTRACTION W/PHACO Left 04/15/2017   Procedure: CATARACT EXTRACTION PHACO AND INTRAOCULAR LENS PLACEMENT ([place])  Left;  Surgeon: Leandrew Koyanagi, MD;  Location: [place];  Service: Ophthalmology;  Laterality: Left;  . COLONOSCOPY WITH PROPOFOL N/A 09/15/2017   Procedure: COLONOSCOPY WITH PROPOFOL;  Surgeon: Lucilla Lame, MD;  Location: [facility] ENDOSCOPY;  Service: Endoscopy;  Laterality: N/A;  . CORONARY ARTERY BYPASS GRAFT  1998   LIMA-LAD, SVG-OM, SVG-RPDA, SVG-DIAG  . CPET / MET  12/2012   Mild chronotropic incompetence - read 82% of predicted; also reduced effort; peak VO2 15.7 / 75% (did not reach Max effort) -- suggested ischemic response in last 1.5 minutes of exercise. Normal pulmonary function on PFTs but poor response  to  . DOPPLER ECHOCARDIOGRAPHY  11/20/2010   EF =>55%; LV norm mild aortic scelorosis  . ESOPHAGOGASTRODUODENOSCOPY (EGD) WITH PROPOFOL N/A 09/15/2017   Procedure: ESOPHAGOGASTRODUODENOSCOPY (EGD) WITH PROPOFOL;  Surgeon: Lucilla Lame, MD;  Location: [facility] ENDOSCOPY;  Service: Endoscopy;  Laterality: N/A;  . FRACTURE SURGERY Right 03/19/2015   wrist  . HIATAL HERNIA REPAIR    . ILIAC ARTERY STENT  12/15/2005   PTA and direct stenting rgt and lft common iliac arteries  . NM GATED MYOVIEW ([place] HX)  12/2017   Low Risk - no ischemia or infarct.  EF > 65%. no RWMA  . NM MYOCAR PERF WALL MOTION  11/11/2010; February 2016   a) TM STRESS----NORMAL PERFUSION ,EF 68%; b) EF 50-55%.  Normal LV function. No significant ischemia or infarction.  . ORIF ELBOW FRACTURE Right 01/01/2017   Procedure: OPEN REDUCTION INTERNAL FIXATION (ORIF) ELBOW/OLECRANON FRACTURE;  Surgeon: Hessie Knows, MD;  Location: [facility] ORS;  Service: Orthopedics;  Laterality: Right;  . ORIF WRIST FRACTURE Right 03/19/2015   Procedure: OPEN REDUCTION INTERNAL FIXATION (ORIF) WRIST FRACTURE;  Surgeon: Hessie Knows, MD;  Location: [facility] ORS;  Service: Orthopedics;  Laterality: Right;  . THORACIC AORTA - CAROTID ANGIOGRAM  October 2007   Dr. 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 TRANSTHORACIC ECHOCARDIOGRAM  February 2016   [facility]: Normal LV function. Dilated left atrium.    There were no vitals filed for this visit.  Subjective Assessment - 07/28/18 1303    Subjective  Patient states he was sore initially after last session but didn't feel soreness after that day. Reports no falls or LOB since last session. No pain at this time.     Patient is accompained by:  Family member    Pertinent History  Patient goes 5x/week for radiation therapy for prostate cancer. Is getting weaker and no longer goes to the senior center. Just started using cane, when walks a lot his low back and legs ache. He has a significant CV history including quadruple bypass, CAD w cardiac stents, on Eliquis, left carotid endarterectomy, AAA. He is functionally independent but lives alone, his partner lives 5 minutes down the road and seems to assist him with daily living. Activity as tolerated per patient report. Has history of memory deficits.     Limitations  Walking;Standing;House hold activities;Other (comment);Lifting    How long can you sit comfortably?  n/a    How long can you stand comfortably?  needs to hold onto cane.     How long can you walk comfortably?  40 ft     Patient Stated Goals  improve stability    Currently in Pain?  No/denies         Nustep lvl 5 5 minutes >60rpm for cardiovascular challenge, VCs for maintaining speed    Leg press 195 lb  2x10.Verbal cues to decrease speed.   Leg press: 75lb 2x10 single limb; verbal cues for decreasing velocity of movement.   10x STS from plinth table, cues for keeping chest up. No UE support  10x STS with basketball toss at top of stand  Walking in // bars: Tossing ball up and down for vertical head nods 2x 86 ft with CGA; occasional stumbles no LOB    In //bars   Standing heel toe raises 10x    Standing on airex pad bicep curls 5lb weights BUE, CGA 10x   Standing 1/2 foam roller (flat side up)  x20 A/P touches SUE support faded to no UE support and occasional use of hands     Cues for appropriate form and exercise technique throughout session  Seated RTB PF 10x each LE   Seated adduction squeeze 10x 5 second holds                        PT Education - 07/28/18 1304    Education provided  Yes    Education Details  exercise technique, stability     Person(s) Educated  Patient    Methods  Explanation;Demonstration;Verbal cues    Comprehension  Verbalized understanding;Returned demonstration       PT Short Term Goals - 07/05/18 1332      PT SHORT TERM GOAL #1   Title  Patient will be independent in home exercise program to improve strength/mobility for better functional independence with ADLs.    Baseline  9/17: HEP given 11/4: compliant at this time    Time  2    Period  Weeks    Status  Partially Met      PT SHORT TERM GOAL #2   Title  Patient will deny any falls over past 2 weeks to demonstrate improved safety awareness at home and work.     Baseline  9/17: progressive weakness 11/4: denies falls last 2 weeks    Time  2    Period  Weeks    Status  Achieved      PT SHORT TERM GOAL #3   Title  Patient will increase BLE gross strength to 4-/5 as to improve functional strength for independent gait, increased standing tolerance and increased  ADL ability.    Baseline  9/17: Hip flex: L 3/5 R 3+/5 abd: 2+/5, 3-/5; add 2/5 2+/5 11/4: hip flexion L 3+/5 R 3+/5 abd 2+/5, 2+/5 add 2+/5, 2+/5    Time  2    Period  Weeks    Status  Partially Met        PT Long Term Goals - 07/05/18 1444      PT LONG TERM GOAL #1   Title  Patient will increase BLE gross strength to 4+/5 as to improve functional strength for independent gait, increased standing tolerance and increased ADL ability.    Baseline  9/17: Hip flex: L 3/5 R 3+/5 abd: 2+/5, 3-/5; add 2/5 2+/5 11/4: hip flexion L 3+/5 R 3+/5 abd 2+/5, 2+/5 add 2+/5, 2+/5    Time  8    Period  Weeks    Status  Partially Met    Target Date  08/30/18      PT LONG TERM GOAL #2   Title  Patient will increase ABC scale score >80% to demonstrate better functional mobility and better confidence with ADLs.     Baseline  9/17: 68.13% 11/4: 76.6%    Time  8    Period  Weeks    Status  Partially Met    Target Date  08/30/18      PT LONG TERM GOAL #3   Title  Patient will increase Berg Balance score by > 6 points (44/56)  to demonstrate decreased fall risk during functional activities.    Baseline  9/17: 38/56 11/4: 39/56    Time  8    Period  Weeks    Status  Partially Met    Target Date  08/30/18      PT LONG TERM GOAL #4   Title  Patient will  increase six minute walk test distance to >1000 for progression to community ambulator and improve gait ability    Baseline  9/18: 1300 ft w/o cane 11/4: 982f w/o cane    Time  8    Period  Weeks    Status  On-going    Target Date  08/30/18      PT LONG TERM GOAL #5   Title  Patient (> 89years old) will complete five times sit to stand test in < 15 seconds without UE support indicating an increased LE strength and improved balance.    Baseline  11/4: requires 1 UE support    Time  8    Period  Weeks    Status  New    Target Date  08/30/18            Plan - 07/28/18 1313    Clinical Impression Statement  Patient challenged with  eccentric control of leg press and sit to stand techniques. Patient challenged with prolonged muscle recruitment leading to excessive speed to perform task to reduce instability. Patient performs all tasks within an acceptable range but requires VCs and CGA to maintain form, balance, and optimal speed of movement for increased safetyPatient will continue to benefit from skilled physical therapy to improve strength, balance, and functional mobility.     Rehab Potential  Fair    Clinical Impairments Affecting Rehab Potential  (+) good support system, high prior level of function (-) currently undergoing radiation therapy for prostate cancer, memory deficits     PT Frequency  2x / week    PT Duration  8 weeks    PT Treatment/Interventions  ADLs/Self Care Home Management;Aquatic Therapy;Cryotherapy;Moist Heat;Traction;Ultrasound;Therapeutic activities;Functional mobility training;Stair training;Gait training;DME Instruction;Therapeutic exercise;Balance training;Neuromuscular re-education;Patient/family education;Manual techniques;Passive range of motion;Energy conservation;Vestibular;Taping    PT Next Visit Plan  strength, balance     PT Home Exercise Plan  see above    Consulted and Agree with Plan of Care  Patient       Patient will benefit from skilled therapeutic intervention in order to improve the following deficits and impairments:  Abnormal gait, Cardiopulmonary status limiting activity, Decreased activity tolerance, Decreased balance, Decreased knowledge of precautions, Decreased endurance, Decreased coordination, Decreased cognition, Decreased knowledge of use of DME, Decreased mobility, Difficulty walking, Decreased safety awareness, Decreased strength, Impaired flexibility, Impaired perceived functional ability, Impaired sensation, Postural dysfunction, Improper body mechanics, Pain  Visit Diagnosis: Unsteadiness on feet  Muscle weakness (generalized)  Other abnormalities of gait and  mobility  Difficulty in walking, not elsewhere classified     Problem List Patient Active Problem List   Diagnosis Date Noted  . [place]state cancer [place] 02/25/[place]  . Prostatic cyst 01/15/2018  . 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 ([place] 09/15/2017  . Blood in stool   . Abnormal feces   . Stricture and stenosis of esophagus   . Mild cognitive impairment 08/18/2017  . Senile purpura ([place] 04/29/2017  . Anemia 04/29/2017  . Frequent falls 04/29/2017  . Benign localized hyperplasia of prostate with urinary obstruction 07/15/2016  . Elevated PSA 07/15/2016  . Incomplete emptying of bladder 07/15/2016  . Urge incontinence 07/15/2016  . Hypothyroidism 04/25/2016  . Macular degeneration 04/25/2016  . Erectile dysfunction due to arterial insufficiency   . Ischemic heart disease with chronotropic incompetence   . CN (constipation) 02/10/2015  . DD (diverticular disease) 02/10/2015  . Fatigue 02/10/2015  . Lichen planus 070/96/2836 . Restless leg 02/10/2015  . 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 B12  deficiency 02/10/2015  . COPD (chronic obstructive pulmonary disease) ([place]) 11/14/2014  . Post Inflammatory Lung Changes 11/14/2014  . PAF (paroxysmal atrial fibrillation) ([facility]) CHA2DS2-VASc = 4. AC = Eliquis   . Insomnia 12/09/2013  . OSA on CPAP   . Dyspnea on exertion - -essentially resolved with BB dose reduction & wgt loss 03/29/2013  . Overweight (BMI 25.0-29.9) -- Wgt back up 01/17/2013  . Atherosclerotic heart disease of native coronary artery without angina pectoris - s/p CABG, occluded SVG-D1; patent LIMA-LAD, SVG-OM, SVG- RCA   . Essential hypertension   . Hyperlipidemia with target LDL less than 70   . Aorto-iliac disease ([place])   . BCC (basal cell carcinoma), eyelid 01/03/2013  . Allergic rhinitis 08/17/2009  . Adaptation reaction 05/28/2009  . Acid reflux 05/28/2009  . Arthritis, degenerative 05/28/2009  . 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 Carotid artery  occlusion without infarction 06/01/2006  . PAD (peripheral artery disease) - bilateral common iliac stents 12/15/2005  . Aortoiliac occlusive disease ([place]) 11/30/2005  . Atherosclerotic heart disease of artery bypass graft 09/01/1997    Janna Arch, PT, DPT   07/28/2018, 1:44 PM  [place] MAIN [facility] SERVICES [address] [place], [place], [address] Phone: [phone]   Fax:  [phone]  Name: RAHIL PASSEY MRN: [id number] Date of Birth: [birth year]

## 2018-08-04 ENCOUNTER — Ambulatory Visit: Payer: Medicare Other | Attending: Radiation Oncology

## 2018-08-04 DIAGNOSIS — R262 Difficulty in walking, not elsewhere classified: Secondary | ICD-10-CM | POA: Diagnosis not present

## 2018-08-04 DIAGNOSIS — R2681 Unsteadiness on feet: Secondary | ICD-10-CM | POA: Insufficient documentation

## 2018-08-04 DIAGNOSIS — M6281 Muscle weakness (generalized): Secondary | ICD-10-CM | POA: Diagnosis not present

## 2018-08-04 DIAGNOSIS — R2689 Other abnormalities of gait and mobility: Secondary | ICD-10-CM

## 2018-08-04 NOTE — Therapy (Signed)
[place] MAIN [facility] SERVICES [address] [place], [place], [address] Phone: [phone]   Fax:  [phone]  Physical Therapy Treatment  Patient Details  Name: [name] MRN: [id number] Date of Birth: 1934/08/21 No data recorded  Encounter Date: 08/04/2018  PT End of Session - 08/04/18 1119    Visit Number  18    Number of Visits  32    Date for PT Re-Evaluation  08/30/18    Authorization Type  PN 8/10 starts 11/4    PT Start Time  1115    PT Stop Time  1200    PT Time Calculation (min)  45 min    Equipment Utilized During Treatment  Gait belt    Activity Tolerance  Patient tolerated treatment well;Patient limited by fatigue    Behavior During Therapy  [facility] for tasks assessed/performed       Past Medical History:  Diagnosis Date  . Arthritis   . Atherosclerotic heart disease of native coronary artery without angina pectoris 1998   - s/p CABG, occluded SVG-D1; patent LIMA-LAD, SVG-OM, SVG- RCA  . Basal cell carcinoma of eyelid 01/03/2013   2019 - R side of Nose  . Benign neoplasm of ascending colon   . Benign neoplasm of descending colon   . Cancer [facility])    Skin cancer  . Colon polyps   . COPD (chronic obstructive pulmonary disease) ([place])   . GERD (gastroesophageal reflux disease)   . Hemorrhoid 02/10/2015  . History of hiatal hernia   . HOH (hard of hearing)   . Hypertension   . Memory loss   . Osteoporosis   . PAF (paroxysmal atrial fibrillation) ([place])   . Prostate disorder   . Sciatic pain    Chronic  . Sleep apnea    cpap  . Temporary cerebral vascular dysfunction 02/10/2015   Had negative work-up.  July, 2012.  No medication changes, had forgot them that day, got dehydrated.     Past Surgical History:  Procedure Laterality Date  . ANKLE SURGERY    . Arch aortogram and carotid aortogram  06/02/2006   Dr Oneida Alar did surgery  . BASAL CELL CARCINOMA EXCISION  04/2016   Dermatology  . CARDIAC CATHETERIZATION   01/30/1998    (Dr. Linard Millers): Native LAD & mRCA CTO. 90% D1. Mod Cx. SVG-D1 CTO. SVG-RCA & SVG-OM along with LIMA-LAD patent;  LV NORMAL Fxn  . CAROTID ENDARTERECTOMY Left Oct. 29, 2007   Dr. Oneida Alar:  . CATARACT EXTRACTION W/PHACO Right 03/05/2017   Procedure: CATARACT EXTRACTION PHACO AND INTRAOCULAR LENS PLACEMENT (IOC);  Surgeon: Leandrew Koyanagi, MD;  Location: [facility] ORS;  Service: Ophthalmology;  Laterality: Right;  [place] 00:58.5 AP% 10.6 CDE 6.20 Fluid Pack lot # [id number]  . CATARACT EXTRACTION W/PHACO Left 04/15/2017   Procedure: CATARACT EXTRACTION PHACO AND INTRAOCULAR LENS PLACEMENT ([place])  Left;  Surgeon: Leandrew Koyanagi, MD;  Location: [place];  Service: Ophthalmology;  Laterality: Left;  . COLONOSCOPY WITH PROPOFOL N/A 09/15/2017   Procedure: COLONOSCOPY WITH PROPOFOL;  Surgeon: Lucilla Lame, MD;  Location: [facility] ENDOSCOPY;  Service: Endoscopy;  Laterality: N/A;  . CORONARY ARTERY BYPASS GRAFT  1998   LIMA-LAD, SVG-OM, SVG-RPDA, SVG-DIAG  . CPET / MET  12/2012   Mild chronotropic incompetence - read 82% of predicted; also reduced effort; peak VO2 15.7 / 75% (did not reach Max effort) -- suggested ischemic response in last 1.5 minutes of exercise. Normal pulmonary function on PFTs but poor response  to  . DOPPLER ECHOCARDIOGRAPHY  11/20/2010   EF =>55%; LV norm mild aortic scelorosis  . ESOPHAGOGASTRODUODENOSCOPY (EGD) WITH PROPOFOL N/A 09/15/2017   Procedure: ESOPHAGOGASTRODUODENOSCOPY (EGD) WITH PROPOFOL;  Surgeon: Lucilla Lame, MD;  Location: [facility] ENDOSCOPY;  Service: Endoscopy;  Laterality: N/A;  . FRACTURE SURGERY Right 03/19/2015   wrist  . HIATAL HERNIA REPAIR    . ILIAC ARTERY STENT  12/15/2005   PTA and direct stenting rgt and lft common iliac arteries  . NM GATED MYOVIEW ([place] [facility])  12/2017   Low Risk - no ischemia or infarct.  EF > 65%. no RWMA  . NM MYOCAR PERF WALL MOTION  11/11/2010; February 2016   a) TM STRESS----NORMAL PERFUSION ,EF 68%; b) EF 50-55%.  Normal LV function. No significant ischemia or infarction.  . ORIF ELBOW FRACTURE Right 01/01/2017   Procedure: OPEN REDUCTION INTERNAL FIXATION (ORIF) ELBOW/OLECRANON FRACTURE;  Surgeon: Hessie Knows, MD;  Location: [facility] ORS;  Service: Orthopedics;  Laterality: Right;  . ORIF WRIST FRACTURE Right 03/19/2015   Procedure: OPEN REDUCTION INTERNAL FIXATION (ORIF) WRIST FRACTURE;  Surgeon: Hessie Knows, MD;  Location: [facility] ORS;  Service: Orthopedics;  Laterality: Right;  . THORACIC AORTA - CAROTID ANGIOGRAM  October 2007   Dr. 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 TRANSTHORACIC ECHOCARDIOGRAM  February 2016   [facility]: Normal LV function. Dilated left atrium.    There were no vitals filed for this visit.  Subjective Assessment - 08/04/18 1118    Subjective  Patient reports that his girlfriend is having him drink more water causing him to have to get up multiple times a night. Reports no falls or LOB since last session.     Patient is accompained by:  Family member    Pertinent History  Patient goes 5x/week for radiation therapy for prostate cancer. Is getting weaker and no longer goes to the senior center. Just started using cane, when walks a lot his low back and legs ache. He has a significant CV history including quadruple bypass, CAD w cardiac stents, on Eliquis, left carotid endarterectomy, AAA. He is functionally independent but lives alone, his partner lives 5 minutes down the road and seems to assist him with daily living. Activity as tolerated per patient report. Has history of memory deficits.     Limitations  Walking;Standing;House hold activities;Other (comment);Lifting    How long can you sit comfortably?  n/a    How long can you stand comfortably?  needs to hold onto cane.     How long can you walk comfortably?  40 ft     Patient Stated Goals  improve stability    Currently in Pain?  No/denies          Nustep lvl 5 5 minutes >60rpm for cardiovascular challenge, VCs for maintaining speed      10x STS with basketball toss at top of stand; cues for keeping chest up  Tossing ball up and down for vertical head nods 2x 86 ft with CGA; occasional stumbles no LOB      In //bars  Airex pad: eyes closed 2x 30 seconds  Turn and pick up cone from surface and reach and place onto step. 10x each LE.  (260 degree turn)   Airex pad: narrow BOS 2x 30 seconds no UE support  Airex pad: tandem stance 1 foot on each airex pad; 2x 30 seconds each LE back.   Orange hurdle step overs  10x each LE; SUE support.   Standing heel toe raises 10x      Standing 1/2 foam roller (flat side up) x20 A/P touches SUE support faded to no UE support and occasional use of hands  Standing hip extension 10x each LE, BUE support  Backwards ambulation in // bars 4x length of Bars, cues for furthering posterior step back.      Cues for appropriate form and exercise technique throughout session   Seated RTB PF 10x each LE    Seated adduction squeeze 10x 5 second holds                          PT Education - 08/04/18 1118    Education provided  Yes    Education Details  exercise technique, stability     Person(s) Educated  Patient    Methods  Explanation;Demonstration;Verbal cues    Comprehension  Verbalized understanding;Returned demonstration       PT Short Term Goals - 07/05/18 1332      PT SHORT TERM GOAL #1   Title  Patient will be independent in home exercise program to improve strength/mobility for better functional independence with ADLs.    Baseline  9/17: HEP given 11/4: compliant at this time    Time  2    Period  Weeks    Status  Partially Met      PT SHORT TERM GOAL #2   Title  Patient will deny any falls over past 2 weeks to demonstrate improved safety awareness at home and work.     Baseline  9/17: progressive weakness 11/4: denies falls last 2 weeks    Time  2     Period  Weeks    Status  Achieved      PT SHORT TERM GOAL #3   Title  Patient will increase BLE gross strength to 4-/5 as to improve functional strength for independent gait, increased standing tolerance and increased ADL ability.    Baseline  9/17: Hip flex: L 3/5 R 3+/5 abd: 2+/5, 3-/5; add 2/5 2+/5 11/4: hip flexion L 3+/5 R 3+/5 abd 2+/5, 2+/5 add 2+/5, 2+/5    Time  2    Period  Weeks    Status  Partially Met        PT Long Term Goals - 07/05/18 1444      PT LONG TERM GOAL #1   Title  Patient will increase BLE gross strength to 4+/5 as to improve functional strength for independent gait, increased standing tolerance and increased ADL ability.    Baseline  9/17: Hip flex: L 3/5 R 3+/5 abd: 2+/5, 3-/5; add 2/5 2+/5 11/4: hip flexion L 3+/5 R 3+/5 abd 2+/5, 2+/5 add 2+/5, 2+/5    Time  8    Period  Weeks    Status  Partially Met    Target Date  08/30/18      PT LONG TERM GOAL #2   Title  Patient will increase ABC scale score >80% to demonstrate better functional mobility and better confidence with ADLs.     Baseline  9/17: 68.13% 11/4: 76.6%    Time  8    Period  Weeks    Status  Partially Met    Target Date  08/30/18      PT LONG TERM GOAL #3   Title  Patient will increase Berg Balance score by > 6 points (44/56)  to demonstrate decreased fall risk  during functional activities.    Baseline  9/17: 38/56 11/4: 39/56    Time  8    Period  Weeks    Status  Partially Met    Target Date  08/30/18      PT LONG TERM GOAL #4   Title  Patient will increase six minute walk test distance to >1000 for progression to community ambulator and improve gait ability    Baseline  9/18: 1300 ft w/o cane 11/4: 954f w/o cane    Time  8    Period  Weeks    Status  On-going    Target Date  08/30/18      PT LONG TERM GOAL #5   Title  Patient (> 637years old) will complete five times sit to stand test in < 15 seconds without UE support indicating an increased LE strength and improved  balance.    Baseline  11/4: requires 1 UE support    Time  8    Period  Weeks    Status  New    Target Date  08/30/18            Plan - 08/04/18 1155    Clinical Impression Statement  Patient presents with good compliance and motivatoin to participate in therapy. Feels unsteady when in the shower and when turning in room so session today focused on stability, turning, and balance reactions. Patient demonstrating improved strength and coordination with decreased episodes of LOB and improved ability to clear small orange hurdle. safetyPatient will continue to benefit from skilled physical therapy to improve strength, balance, and functional mobility.     Rehab Potential  Fair    Clinical Impairments Affecting Rehab Potential  (+) good support system, high prior level of function (-) currently undergoing radiation therapy for prostate cancer, memory deficits     PT Frequency  2x / week    PT Duration  8 weeks    PT Treatment/Interventions  ADLs/Self Care Home Management;Aquatic Therapy;Cryotherapy;Moist Heat;Traction;Ultrasound;Therapeutic activities;Functional mobility training;Stair training;Gait training;DME Instruction;Therapeutic exercise;Balance training;Neuromuscular re-education;Patient/family education;Manual techniques;Passive range of motion;Energy conservation;Vestibular;Taping    PT Next Visit Plan  strength, balance     PT Home Exercise Plan  see above    Consulted and Agree with Plan of Care  Patient       Patient will benefit from skilled therapeutic intervention in order to improve the following deficits and impairments:  Abnormal gait, Cardiopulmonary status limiting activity, Decreased activity tolerance, Decreased balance, Decreased knowledge of precautions, Decreased endurance, Decreased coordination, Decreased cognition, Decreased knowledge of use of DME, Decreased mobility, Difficulty walking, Decreased safety awareness, Decreased strength, Impaired flexibility,  Impaired perceived functional ability, Impaired sensation, Postural dysfunction, Improper body mechanics, Pain  Visit Diagnosis: Unsteadiness on feet  Muscle weakness (generalized)  Other abnormalities of gait and mobility  Difficulty in walking, not elsewhere classified     Problem List Patient Active Problem List   Diagnosis Date Noted  . Prostate cancer ([place] 02/25/2018  . Prostatic cyst 01/15/2018  . 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 [place] 09/15/2017  . Blood in stool   . Abnormal feces   . Stricture and stenosis of esophagus   . Mild cognitive impairment 08/18/2017  . Senile purpura ([place] 04/29/2017  . Anemia 04/29/2017  . Frequent falls 04/29/2017  . Benign localized hyperplasia of prostate with urinary obstruction 07/15/2016  . Elevated PSA 07/15/2016  . Incomplete emptying of bladder 07/15/2016  . Urge incontinence 07/15/2016  . Hypothyroidism 04/25/2016  . Macular degeneration 04/25/2016  .  Erectile dysfunction due to arterial insufficiency   . Ischemic heart disease with chronotropic incompetence   . CN (constipation) 02/10/2015  . DD (diverticular disease) 02/10/2015  . Fatigue 02/10/2015  . Lichen planus 35/52/1747  . Restless leg 02/10/2015  . 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 B12 deficiency 02/10/2015  . COPD (chronic obstructive pulmonary disease) ([place]) 11/14/2014  . Post Inflammatory Lung Changes 11/14/2014  . PAF (paroxysmal atrial fibrillation) ([facility]) CHA2DS2-VASc = 4. AC = Eliquis   . Insomnia 12/09/2013  . OSA on CPAP   . Dyspnea on exertion - -essentially resolved with BB dose reduction & wgt loss 03/29/2013  . Overweight (BMI 25.0-29.9) -- Wgt back up 01/17/2013  . Atherosclerotic heart disease of native coronary artery without angina pectoris - s/p CABG, occluded SVG-D1; patent LIMA-LAD, SVG-OM, SVG- RCA   . Essential hypertension   . Hyperlipidemia with target LDL less than 70   . Aorto-iliac disease ([place])   . BCC (basal cell carcinoma),  eyelid 01/03/2013  . Allergic rhinitis 08/17/2009  . Adaptation reaction 05/28/2009  . Acid reflux 05/28/2009  . Arthritis, degenerative 05/28/2009  . 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 Carotid artery occlusion without infarction 06/01/2006  . PAD (peripheral artery disease) - bilateral common iliac stents 12/15/2005  . Aortoiliac occlusive disease ([place]) 11/30/2005  . Atherosclerotic heart disease of artery bypass graft 09/01/1997   Janna Arch, PT, DPT   08/04/2018, 12:00 PM  [place] MAIN [facility] SERVICES [address] [place], [place], [address] Phone: [phone]   Fax:  [phone]  Name: [name] MRN: [id number] Date of Birth: Apr 07, 1934

## 2018-08-09 DIAGNOSIS — X32XXXA Exposure to sunlight, initial encounter: Secondary | ICD-10-CM | POA: Diagnosis not present

## 2018-08-09 DIAGNOSIS — L821 Other seborrheic keratosis: Secondary | ICD-10-CM | POA: Diagnosis not present

## 2018-08-09 DIAGNOSIS — L570 Actinic keratosis: Secondary | ICD-10-CM | POA: Diagnosis not present

## 2018-08-09 DIAGNOSIS — Z08 Encounter for follow-up examination after completed treatment for malignant neoplasm: Secondary | ICD-10-CM | POA: Diagnosis not present

## 2018-08-09 DIAGNOSIS — Z85828 Personal history of other malignant neoplasm of skin: Secondary | ICD-10-CM | POA: Diagnosis not present

## 2018-08-10 ENCOUNTER — Ambulatory Visit: Payer: Medicare Other

## 2018-08-10 DIAGNOSIS — R262 Difficulty in walking, not elsewhere classified: Secondary | ICD-10-CM

## 2018-08-10 DIAGNOSIS — R2681 Unsteadiness on feet: Secondary | ICD-10-CM | POA: Diagnosis not present

## 2018-08-10 DIAGNOSIS — R2689 Other abnormalities of gait and mobility: Secondary | ICD-10-CM | POA: Diagnosis not present

## 2018-08-10 DIAGNOSIS — M6281 Muscle weakness (generalized): Secondary | ICD-10-CM | POA: Diagnosis not present

## 2018-08-10 NOTE — Therapy (Signed)
[place] MAIN [facility] SERVICES [address] [place], [place], [address] Phone: [phone]   Fax:  [phone]  Physical Therapy Treatment  Patient Details  Name: [name] MRN: [id number] Date of Birth: July 15, 1934 No data recorded  Encounter Date: 08/10/2018  PT End of Session - 08/10/18 1536    Visit Number  19    Number of Visits  32    Date for PT Re-Evaluation  08/30/18    Authorization Type  PN 9/10 starts 11/4    PT Start Time  1530    PT Stop Time  1615    PT Time Calculation (min)  45 min    Equipment Utilized During Treatment  Gait belt    Activity Tolerance  Patient tolerated treatment well;Patient limited by fatigue    Behavior During Therapy  [facility] for tasks assessed/performed       Past Medical History:  Diagnosis Date  . Arthritis   . Atherosclerotic heart disease of native coronary artery without angina pectoris 1998   - s/p CABG, occluded SVG-D1; patent LIMA-LAD, SVG-OM, SVG- RCA  . Basal cell carcinoma of eyelid 01/03/2013   2019 - R side of Nose  . Benign neoplasm of ascending colon   . Benign neoplasm of descending colon   . Cancer [facility])    Skin cancer  . Colon polyps   . COPD (chronic obstructive pulmonary disease) ([place])   . GERD (gastroesophageal reflux disease)   . Hemorrhoid 02/10/2015  . History of hiatal hernia   . HOH (hard of hearing)   . Hypertension   . Memory loss   . Osteoporosis   . PAF (paroxysmal atrial fibrillation) ([place])   . Prostate disorder   . Sciatic pain    Chronic  . Sleep apnea    cpap  . Temporary cerebral vascular dysfunction 02/10/2015   Had negative work-up.  July, 2012.  No medication changes, had forgot them that day, got dehydrated.     Past Surgical History:  Procedure Laterality Date  . ANKLE SURGERY    . Arch aortogram and carotid aortogram  06/02/2006   Dr Oneida Alar did surgery  . BASAL CELL CARCINOMA EXCISION  04/2016   Dermatology  . CARDIAC CATHETERIZATION   01/30/1998    (Dr. Linard Millers): Native LAD & mRCA CTO. 90% D1. Mod Cx. SVG-D1 CTO. SVG-RCA & SVG-OM along with LIMA-LAD patent;  LV NORMAL Fxn  . CAROTID ENDARTERECTOMY Left Oct. 29, 2007   Dr. Oneida Alar:  . CATARACT EXTRACTION W/PHACO Right 03/05/2017   Procedure: CATARACT EXTRACTION PHACO AND INTRAOCULAR LENS PLACEMENT (IOC);  Surgeon: Leandrew Koyanagi, MD;  Location: [facility] ORS;  Service: Ophthalmology;  Laterality: Right;  [place] 00:58.5 AP% 10.6 CDE 6.20 Fluid Pack lot # [id number]  . CATARACT EXTRACTION W/PHACO Left 04/15/2017   Procedure: CATARACT EXTRACTION PHACO AND INTRAOCULAR LENS PLACEMENT ([place])  Left;  Surgeon: Leandrew Koyanagi, MD;  Location: [place];  Service: Ophthalmology;  Laterality: Left;  . COLONOSCOPY WITH PROPOFOL N/A 09/15/2017   Procedure: COLONOSCOPY WITH PROPOFOL;  Surgeon: Lucilla Lame, MD;  Location: [facility] ENDOSCOPY;  Service: Endoscopy;  Laterality: N/A;  . CORONARY ARTERY BYPASS GRAFT  1998   LIMA-LAD, SVG-OM, SVG-RPDA, SVG-DIAG  . CPET / MET  12/2012   Mild chronotropic incompetence - read 82% of predicted; also reduced effort; peak VO2 15.7 / 75% (did not reach Max effort) -- suggested ischemic response in last 1.5 minutes of exercise. Normal pulmonary function on PFTs but poor response  to  . DOPPLER ECHOCARDIOGRAPHY  11/20/2010   EF =>55%; LV norm mild aortic scelorosis  . ESOPHAGOGASTRODUODENOSCOPY (EGD) WITH PROPOFOL N/A 09/15/2017   Procedure: ESOPHAGOGASTRODUODENOSCOPY (EGD) WITH PROPOFOL;  Surgeon: Lucilla Lame, MD;  Location: [facility] ENDOSCOPY;  Service: Endoscopy;  Laterality: N/A;  . FRACTURE SURGERY Right 03/19/2015   wrist  . HIATAL HERNIA REPAIR    . ILIAC ARTERY STENT  12/15/2005   PTA and direct stenting rgt and lft common iliac arteries  . NM GATED MYOVIEW ([place] [facility])  12/2017   Low Risk - no ischemia or infarct.  EF > 65%. no RWMA  . NM MYOCAR PERF WALL MOTION  11/11/2010; February 2016   a) TM STRESS----NORMAL PERFUSION ,EF 68%; b) EF 50-55%.  Normal LV function. No significant ischemia or infarction.  . ORIF ELBOW FRACTURE Right 01/01/2017   Procedure: OPEN REDUCTION INTERNAL FIXATION (ORIF) ELBOW/OLECRANON FRACTURE;  Surgeon: Hessie Knows, MD;  Location: [facility] ORS;  Service: Orthopedics;  Laterality: Right;  . ORIF WRIST FRACTURE Right 03/19/2015   Procedure: OPEN REDUCTION INTERNAL FIXATION (ORIF) WRIST FRACTURE;  Surgeon: Hessie Knows, MD;  Location: [facility] ORS;  Service: Orthopedics;  Laterality: Right;  . THORACIC AORTA - CAROTID ANGIOGRAM  October 2007   Dr. 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 TRANSTHORACIC ECHOCARDIOGRAM  February 2016   [facility]: Normal LV function. Dilated left atrium.    There were no vitals filed for this visit.  Subjective Assessment - 08/10/18 1534    Subjective  Patient reports he went to his house over the weekend to shower now that his son has put in the grab bars. Reports no LOB or falls. Is compliant with his HEP.     Patient is accompained by:  Family member    Pertinent History  Patient goes 5x/week for radiation therapy for prostate cancer. Is getting weaker and no longer goes to the senior center. Just started using cane, when walks a lot his low back and legs ache. He has a significant CV history including quadruple bypass, CAD w cardiac stents, on Eliquis, left carotid endarterectomy, AAA. He is functionally independent but lives alone, his partner lives 5 minutes down the road and seems to assist him with daily living. Activity as tolerated per patient report. Has history of memory deficits.     Limitations  Walking;Standing;House hold activities;Other (comment);Lifting    How long can you sit comfortably?  n/a    How long can you stand comfortably?  needs to hold onto cane.     How long can you walk comfortably?  40 ft     Patient Stated Goals  improve stability    Currently in Pain?  No/denies          Nustep lvl 5 5 minutes >60rpm for cardiovascular challenge, VCs for maintaining speed      10x STS with basketball toss at top of stand; cues for keeping chest up   Tossing ball up and down for vertical head nods 4x 86 ft with CGA; occasional stumbles no LOB      In //bars   Airex pad: eyes closed 2x 30 seconds   Modified single limb support: opp LE on soccer ball 2x 30 seconds each LE.    Airex pad: narrow BOS 2x 30 seconds no UE support   Airex pad: tandem stance 1 foot on each airex pad; throw basketball into hoop 2 minutes each LE   Orange  hurdle step overs 10x each LE; SUE support.    Standing heel toe raises 10x      Standing 1/2 foam roller (flat side up) x20 A/P touches SUE support faded to no UE support and occasional use of hands   Standing hip extension 10x each LE, BUE support     Cues for appropriate form and exercise technique throughout sessio   Seated RTB PF 10x each LE    Seated adduction squeeze 10x 5 second holds                         PT Education - 08/10/18 1536    Education provided  Yes    Education Details  exercise technique, stability     Person(s) Educated  Patient    Methods  Explanation;Demonstration;Verbal cues    Comprehension  Verbalized understanding;Returned demonstration;Need further instruction       PT Short Term Goals - 07/05/18 1332      PT SHORT TERM GOAL #1   Title  Patient will be independent in home exercise program to improve strength/mobility for better functional independence with ADLs.    Baseline  9/17: HEP given 11/4: compliant at this time    Time  2    Period  Weeks    Status  Partially Met      PT SHORT TERM GOAL #2   Title  Patient will deny any falls over past 2 weeks to demonstrate improved safety awareness at home and work.     Baseline  9/17: progressive weakness 11/4: denies falls last 2 weeks    Time  2    Period  Weeks    Status  Achieved      PT SHORT TERM GOAL #3   Title   Patient will increase BLE gross strength to 4-/5 as to improve functional strength for independent gait, increased standing tolerance and increased ADL ability.    Baseline  9/17: Hip flex: L 3/5 R 3+/5 abd: 2+/5, 3-/5; add 2/5 2+/5 11/4: hip flexion L 3+/5 R 3+/5 abd 2+/5, 2+/5 add 2+/5, 2+/5    Time  2    Period  Weeks    Status  Partially Met        PT Long Term Goals - 07/05/18 1444      PT LONG TERM GOAL #1   Title  Patient will increase BLE gross strength to 4+/5 as to improve functional strength for independent gait, increased standing tolerance and increased ADL ability.    Baseline  9/17: Hip flex: L 3/5 R 3+/5 abd: 2+/5, 3-/5; add 2/5 2+/5 11/4: hip flexion L 3+/5 R 3+/5 abd 2+/5, 2+/5 add 2+/5, 2+/5    Time  8    Period  Weeks    Status  Partially Met    Target Date  08/30/18      PT LONG TERM GOAL #2   Title  Patient will increase ABC scale score >80% to demonstrate better functional mobility and better confidence with ADLs.     Baseline  9/17: 68.13% 11/4: 76.6%    Time  8    Period  Weeks    Status  Partially Met    Target Date  08/30/18      PT LONG TERM GOAL #3   Title  Patient will increase Berg Balance score by > 6 points (44/56)  to demonstrate decreased fall risk during functional activities.    Baseline  9/17: 38/56 11/4:  39/56    Time  8    Period  Weeks    Status  Partially Met    Target Date  08/30/18      PT LONG TERM GOAL #4   Title  Patient will increase six minute walk test distance to >1000 for progression to community ambulator and improve gait ability    Baseline  9/18: 1300 ft w/o cane 11/4: 965f w/o cane    Time  8    Period  Weeks    Status  On-going    Target Date  08/30/18      PT LONG TERM GOAL #5   Title  Patient (> 852years old) will complete five times sit to stand test in < 15 seconds without UE support indicating an increased LE strength and improved balance.    Baseline  11/4: requires 1 UE support    Time  8    Period  Weeks     Status  New    Target Date  08/30/18            Plan - 08/10/18 2103    Clinical Impression Statement  Patient continues to progress with dynamic stability throwing ball in hallway with no episodes of LOB this session. Patient challenged with prolonged muscle recruitment leading to excessive speed to perform task to reduce instability. Patient will continue to benefit from skilled physical therapy to improve strength, balance, and functional mobility.     Rehab Potential  Fair    Clinical Impairments Affecting Rehab Potential  (+) good support system, high prior level of function (-) currently undergoing radiation therapy for prostate cancer, memory deficits     PT Frequency  2x / week    PT Duration  8 weeks    PT Treatment/Interventions  ADLs/Self Care Home Management;Aquatic Therapy;Cryotherapy;Moist Heat;Traction;Ultrasound;Therapeutic activities;Functional mobility training;Stair training;Gait training;DME Instruction;Therapeutic exercise;Balance training;Neuromuscular re-education;Patient/family education;Manual techniques;Passive range of motion;Energy conservation;Vestibular;Taping    PT Next Visit Plan  strength, balance     PT Home Exercise Plan  see above    Consulted and Agree with Plan of Care  Patient       Patient will benefit from skilled therapeutic intervention in order to improve the following deficits and impairments:  Abnormal gait, Cardiopulmonary status limiting activity, Decreased activity tolerance, Decreased balance, Decreased knowledge of precautions, Decreased endurance, Decreased coordination, Decreased cognition, Decreased knowledge of use of DME, Decreased mobility, Difficulty walking, Decreased safety awareness, Decreased strength, Impaired flexibility, Impaired perceived functional ability, Impaired sensation, Postural dysfunction, Improper body mechanics, Pain  Visit Diagnosis: Unsteadiness on feet  Muscle weakness (generalized)  Other abnormalities  of gait and mobility  Difficulty in walking, not elsewhere classified     Problem List Patient Active Problem List   Diagnosis Date Noted  . Prostate cancer ([place] 02/25/2018  . Prostatic cyst 01/15/2018  . 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 ([place] 09/15/2017  . Blood in stool   . Abnormal feces   . Stricture and stenosis of esophagus   . Mild cognitive impairment 08/18/2017  . Senile purpura ([place] 04/29/2017  . Anemia 04/29/2017  . Frequent falls 04/29/2017  . Benign localized hyperplasia of prostate with urinary obstruction 07/15/2016  . Elevated PSA 07/15/2016  . Incomplete emptying of bladder 07/15/2016  . Urge incontinence 07/15/2016  . Hypothyroidism 04/25/2016  . Macular degeneration 04/25/2016  . Erectile dysfunction due to arterial insufficiency   . Ischemic heart disease with chronotropic incompetence   . CN (constipation) 02/10/2015  . DD (diverticular disease)  02/10/2015  . Fatigue 02/10/2015  . Lichen planus 23/34/3568  . Restless leg 02/10/2015  . 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 B12 deficiency 02/10/2015  . COPD (chronic obstructive pulmonary disease) ([place]) 11/14/2014  . Post Inflammatory Lung Changes 11/14/2014  . PAF (paroxysmal atrial fibrillation) ([facility]) CHA2DS2-VASc = 4. AC = Eliquis   . Insomnia 12/09/2013  . OSA on CPAP   . Dyspnea on exertion - -essentially resolved with BB dose reduction & wgt loss 03/29/2013  . Overweight (BMI 25.0-29.9) -- Wgt back up 01/17/2013  . Atherosclerotic heart disease of native coronary artery without angina pectoris - s/p CABG, occluded SVG-D1; patent LIMA-LAD, SVG-OM, SVG- RCA   . Essential hypertension   . Hyperlipidemia with target LDL less than 70   . Aorto-iliac disease ([place])   . BCC (basal cell carcinoma), eyelid 01/03/2013  . Allergic rhinitis 08/17/2009  . Adaptation reaction 05/28/2009  . Acid reflux 05/28/2009  . Arthritis, degenerative 05/28/2009  . 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  Carotid artery occlusion without infarction 06/01/2006  . PAD (peripheral artery disease) - bilateral common iliac stents 12/15/2005  . Aortoiliac occlusive disease ([place]) 11/30/2005  . Atherosclerotic heart disease of artery bypass graft 09/01/1997   Janna Arch, PT, DPT   08/10/2018, 9:04 PM  [place] MAIN [facility] SERVICES [address] [place], [place], [address] Phone: [phone]   Fax:  [phone]  Name: JARRAD MCLEES MRN: [id number] Date of Birth: [birth year]

## 2018-08-12 ENCOUNTER — Ambulatory Visit: Payer: Medicare Other

## 2018-08-12 DIAGNOSIS — M6281 Muscle weakness (generalized): Secondary | ICD-10-CM

## 2018-08-12 DIAGNOSIS — R262 Difficulty in walking, not elsewhere classified: Secondary | ICD-10-CM | POA: Diagnosis not present

## 2018-08-12 DIAGNOSIS — R2681 Unsteadiness on feet: Secondary | ICD-10-CM

## 2018-08-12 DIAGNOSIS — R2689 Other abnormalities of gait and mobility: Secondary | ICD-10-CM | POA: Diagnosis not present

## 2018-08-12 NOTE — Therapy (Signed)
[place] MAIN [facility] SERVICES [address] [place], [place], [address] Phone: [phone]   Fax:  [phone]  Physical Therapy Treatment Physical Therapy Progress Note/    Dates of reporting period  07/05/18   to   08/12/18  Patient Details  Name: [name] MRN: [id number] Date of Birth: 10/14/1933 No data recorded  Encounter Date: 08/12/2018  PT End of Session - 08/12/18 1313    Visit Number  20    Number of Visits  32    Date for PT Re-Evaluation  08/30/18    Authorization Type  PN 10/10 starts 11/4 next 1/10 starting 12/12    PT Start Time  1300    PT Stop Time  1344    PT Time Calculation (min)  44 min    Equipment Utilized During Treatment  Gait belt    Activity Tolerance  Patient tolerated treatment well;Patient limited by fatigue    Behavior During Therapy  WFL for tasks assessed/performed       Past Medical History:  Diagnosis Date  . Arthritis   . Atherosclerotic heart disease of native coronary artery without angina pectoris 1998   - s/p CABG, occluded SVG-D1; patent LIMA-LAD, SVG-OM, SVG- RCA  . Basal cell carcinoma of eyelid 01/03/2013   2019 - R side of Nose  . Benign neoplasm of ascending colon   . Benign neoplasm of descending colon   . Cancer [facility])    Skin cancer  . Colon polyps   . COPD (chronic obstructive pulmonary disease) ([place])   . GERD (gastroesophageal reflux disease)   . Hemorrhoid 02/10/2015  . History of hiatal hernia   . HOH (hard of hearing)   . Hypertension   . Memory loss   . Osteoporosis   . PAF (paroxysmal atrial fibrillation) ([place])   . Prostate disorder   . Sciatic pain    Chronic  . Sleep apnea    cpap  . Temporary cerebral vascular dysfunction 02/10/2015   Had negative work-up.  July, 2012.  No medication changes, had forgot them that day, got dehydrated.     Past Surgical History:  Procedure Laterality Date  . ANKLE SURGERY    . Arch aortogram and carotid aortogram   06/02/2006   Dr Oneida Alar did surgery  . BASAL CELL CARCINOMA EXCISION  04/2016   Dermatology  . CARDIAC CATHETERIZATION  01/30/1998    (Dr. Linard Millers): Native LAD & mRCA CTO. 90% D1. Mod Cx. SVG-D1 CTO. SVG-RCA & SVG-OM along with LIMA-LAD patent;  LV NORMAL Fxn  . CAROTID ENDARTERECTOMY Left Oct. 29, 2007   Dr. Oneida Alar:  . CATARACT EXTRACTION W/PHACO Right 03/05/2017   Procedure: CATARACT EXTRACTION PHACO AND INTRAOCULAR LENS PLACEMENT (IOC);  Surgeon: Leandrew Koyanagi, MD;  Location: [facility] ORS;  Service: Ophthalmology;  Laterality: Right;  [place] 00:58.5 AP% 10.6 CDE 6.20 Fluid Pack lot # [id number]  . CATARACT EXTRACTION W/PHACO Left 04/15/2017   Procedure: CATARACT EXTRACTION PHACO AND INTRAOCULAR LENS PLACEMENT ([place])  Left;  Surgeon: Leandrew Koyanagi, MD;  Location: [place];  Service: Ophthalmology;  Laterality: Left;  . COLONOSCOPY WITH PROPOFOL N/A 09/15/2017   Procedure: COLONOSCOPY WITH PROPOFOL;  Surgeon: Lucilla Lame, MD;  Location: [facility] ENDOSCOPY;  Service: Endoscopy;  Laterality: N/A;  . CORONARY ARTERY BYPASS GRAFT  1998   LIMA-LAD, SVG-OM, SVG-RPDA, SVG-DIAG  . CPET / MET  12/2012   Mild chronotropic incompetence - read 82% of predicted; also reduced effort; peak VO2 15.7 / 75% (  did not reach Max effort) -- suggested ischemic response in last 1.5 minutes of exercise. Normal pulmonary function on PFTs but poor response to  . DOPPLER ECHOCARDIOGRAPHY  11/20/2010   EF =>55%; LV norm mild aortic scelorosis  . ESOPHAGOGASTRODUODENOSCOPY (EGD) WITH PROPOFOL N/A 09/15/2017   Procedure: ESOPHAGOGASTRODUODENOSCOPY (EGD) WITH PROPOFOL;  Surgeon: Lucilla Lame, MD;  Location: [facility] ENDOSCOPY;  Service: Endoscopy;  Laterality: N/A;  . FRACTURE SURGERY Right 03/19/2015   wrist  . HIATAL HERNIA REPAIR    . ILIAC ARTERY STENT  12/15/2005   PTA and direct stenting rgt and lft common iliac arteries  . NM GATED MYOVIEW ([place] HX)  12/2017   Low Risk - no ischemia or infarct.  EF > 65%. no  RWMA  . NM MYOCAR PERF WALL MOTION  11/11/2010; February 2016   a) TM STRESS----NORMAL PERFUSION ,EF 68%; b) EF 50-55%. Normal LV function. No significant ischemia or infarction.  . ORIF ELBOW FRACTURE Right 01/01/2017   Procedure: OPEN REDUCTION INTERNAL FIXATION (ORIF) ELBOW/OLECRANON FRACTURE;  Surgeon: Hessie Knows, MD;  Location: [facility] ORS;  Service: Orthopedics;  Laterality: Right;  . ORIF WRIST FRACTURE Right 03/19/2015   Procedure: OPEN REDUCTION INTERNAL FIXATION (ORIF) WRIST FRACTURE;  Surgeon: Hessie Knows, MD;  Location: [facility] ORS;  Service: Orthopedics;  Laterality: Right;  . THORACIC AORTA - CAROTID ANGIOGRAM  October 2007   Dr. 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 TRANSTHORACIC ECHOCARDIOGRAM  February 2016   [facility]: Normal LV function. Dilated left atrium.    There were no vitals filed for this visit.  Subjective Assessment - 08/12/18 1447    Subjective  Patient reports he is having a good day. Has been compliant with HEP. Is going to play bingo this afternoon. Reports no falls or LOB    Patient is accompained by:  Family member    Pertinent History  Patient goes 5x/week for radiation therapy for prostate cancer. Is getting weaker and no longer goes to the senior center. Just started using cane, when walks a lot his low back and legs ache. He has a significant CV history including quadruple bypass, CAD w cardiac stents, on Eliquis, left carotid endarterectomy, AAA. He is functionally independent but lives alone, his partner lives 5 minutes down the road and seems to assist him with daily living. Activity as tolerated per patient report. Has history of memory deficits.     Limitations  Walking;Standing;House hold activities;Other (comment);Lifting    How long can you sit comfortably?  n/a    How long can you stand comfortably?  needs to hold onto cane.     How long can you walk comfortably?  40  ft     Patient Stated Goals  improve stability    Currently in Pain?  No/denies       [facility] PT Assessment - 08/12/18 0001      Berg Balance Test   Sit to Stand  Able to stand without using hands and stabilize independently    Standing Unsupported  Able to stand safely 2 minutes    Sitting with Back Unsupported but Feet Supported on Floor or Stool  Able to sit safely and securely 2 minutes    Stand to Sit  Controls descent by using hands    Transfers  Able to transfer safely, definite need of hands    Standing Unsupported with Eyes Closed  Able to stand 10 seconds safely  Standing Ubsupported with Feet Together  Able to place feet together independently and stand for 1 minute with supervision    From Standing, Reach Forward with Outstretched Arm  Can reach forward >12 cm safely (5")    From Standing Position, Pick up Object from [place] to pick up shoe, needs supervision    From Standing Position, Turn to Look Behind Over each Shoulder  Looks behind from both sides and weight shifts well    Turn 360 Degrees  Able to turn 360 degrees safely but slowly    Standing Unsupported, Alternately Place Feet on Step/Stool  Able to stand independently and complete 8 steps >20 seconds    Standing Unsupported, One Foot in Front  Able to take small step independently and hold 30 seconds    Standing on One Leg  Tries to lift leg/unable to hold 3 seconds but remains standing independently    Total Score  43        Patient's condition has the potential to improve in response to therapy. Maximum improvement is yet to be obtained. The anticipated improvement is attainable and reasonable in a generally predictable time.  Patient reports he is feeling more confident and stable however continues to be limited in prolonged walking and his "hips hurt/tire" after a bit.    BLE strength: 4/5 gross; gluteals 3+/5 ABC: 79%  BERG: 43/ 56  6 MWT: 840 ft without cane with trendelenberg 5x STS : 18 seconds no UE  support   Supine:  hamstring stretch 2x60 seconds, popliteal angle 2x60 seconds LE rotation 60 seconds hooklying supine:   GTB abduction 15x   Bridges 10x   SLR 10x each LE RTB pf 15x   Supine and seated interventions performed after 6 minute walk test due to fatigue.                     PT Education - 08/12/18 1449    Education provided  Yes    Education Details  exercise technique, stability     Person(s) Educated  Patient    Methods  Explanation;Tactile cues;Verbal cues    Comprehension  Verbalized understanding;Returned demonstration;Tactile cues required;Need further instruction;Verbal cues required       PT Short Term Goals - 08/12/18 1313      PT SHORT TERM GOAL #1   Title  Patient will be independent in home exercise program to improve strength/mobility for better functional independence with ADLs.    Baseline  9/17: HEP given 11/4: compliant at this time    Time  2    Period  Weeks    Status  Partially Met      PT SHORT TERM GOAL #2   Title  Patient will deny any falls over past 2 weeks to demonstrate improved safety awareness at home and work.     Baseline  9/17: progressive weakness 11/4: denies falls last 2 weeks    Time  2    Period  Weeks    Status  Achieved      PT SHORT TERM GOAL #3   Title  Patient will increase BLE gross strength to 4-/5 as to improve functional strength for independent gait, increased standing tolerance and increased ADL ability.    Baseline  9/17: Hip flex: L 3/5 R 3+/5 abd: 2+/5, 3-/5; add 2/5 2+/5 11/4: hip flexion L 3+/5 R 3+/5 abd 2+/5, 2+/5 add 2+/5, 2+/5    Time  2    Period  Weeks  Status  Partially Met        PT Long Term Goals - 08/12/18 1313      PT LONG TERM GOAL #1   Title  Patient will increase BLE gross strength to 4+/5 as to improve functional strength for independent gait, increased standing tolerance and increased ADL ability.    Baseline  9/17: Hip flex: L 3/5 R 3+/5 abd: 2+/5, 3-/5; add  2/5 2+/5 11/4: hip flexion L 3+/5 R 3+/5 abd 2+/5, 2+/5 add 2+/5, 2+/5    Time  8    Period  Weeks    Status  Partially Met      PT LONG TERM GOAL #2   Title  Patient will increase ABC scale score >80% to demonstrate better functional mobility and better confidence with ADLs.     Baseline  9/17: 68.13% 11/4: 76.6%    Time  8    Period  Weeks    Status  Partially Met      PT LONG TERM GOAL #3   Title  Patient will increase Berg Balance score by > 6 points (44/56)  to demonstrate decreased fall risk during functional activities.    Baseline  9/17: 38/56 11/4: 39/56 12/12: 43/56    Time  8    Period  Weeks    Status  Partially Met    Target Date  08/30/18      PT LONG TERM GOAL #4   Title  Patient will increase six minute walk test distance to >1000 for progression to community ambulator and improve gait ability    Baseline  9/18: 1300 ft w/o cane 11/4: 925f  12/12: 840 ft without cane    Time  8    Period  Weeks    Status  On-going    Target Date  08/30/18      PT LONG TERM GOAL #5   Title  Patient (> 669years old) will complete five times sit to stand test in < 15 seconds without UE support indicating an increased LE strength and improved balance.    Baseline  11/4: requires 1 UE support 12/12:  18 seconds no UE support    Time  8    Period  Weeks    Status  Partially Met            Plan - 08/12/18 1456    Clinical Impression Statement  Patient demonstrates improved balance as seen in BERG score improvement from 39/56 on 11/4 to 43/56 today. Patient is now able to perform sit to stands without UE support. His 6 minute walk test was limited due to fatigue and noted Trendelenberg gait from gluteal weakness. Lower extremities ares becoming stronger and more efficient with muscle recruitment. Patient's condition has the potential to improve in response to therapy. Maximum improvement is yet to be obtained. The anticipated improvement is attainable and reasonable in a generally  predictable time.  Patient will continue to benefit from skilled physical therapy to improve strength, balance, and functional mobility.     Rehab Potential  Fair    Clinical Impairments Affecting Rehab Potential  (+) good support system, high prior level of function (-) currently undergoing radiation therapy for prostate cancer, memory deficits     PT Frequency  2x / week    PT Duration  8 weeks    PT Treatment/Interventions  ADLs/Self Care Home Management;Aquatic Therapy;Cryotherapy;Moist Heat;Traction;Ultrasound;Therapeutic activities;Functional mobility training;Stair training;Gait training;DME Instruction;Therapeutic exercise;Balance training;Neuromuscular re-education;Patient/family education;Manual techniques;Passive range of motion;Energy conservation;Vestibular;Taping  PT Next Visit Plan  strength, balance     PT Home Exercise Plan  see above    Consulted and Agree with Plan of Care  Patient       Patient will benefit from skilled therapeutic intervention in order to improve the following deficits and impairments:  Abnormal gait, Cardiopulmonary status limiting activity, Decreased activity tolerance, Decreased balance, Decreased knowledge of precautions, Decreased endurance, Decreased coordination, Decreased cognition, Decreased knowledge of use of DME, Decreased mobility, Difficulty walking, Decreased safety awareness, Decreased strength, Impaired flexibility, Impaired perceived functional ability, Impaired sensation, Postural dysfunction, Improper body mechanics, Pain  Visit Diagnosis: Unsteadiness on feet  Muscle weakness (generalized)  Other abnormalities of gait and mobility  Difficulty in walking, not elsewhere classified     Problem List Patient Active Problem List   Diagnosis Date Noted  . Prostate cancer ([place]) 02/25/2018  . Prostatic cyst 01/15/2018  . 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 ([place]) 09/15/2017  . Blood in stool   . Abnormal feces   . Stricture and stenosis  of esophagus   . Mild cognitive impairment 08/18/2017  . Senile purpura ([place]) 04/29/2017  . Anemia 04/29/2017  . Frequent falls 04/29/2017  . Benign localized hyperplasia of prostate with urinary obstruction 07/15/2016  . Elevated PSA 07/15/2016  . Incomplete emptying of bladder 07/15/2016  . Urge incontinence 07/15/2016  . Hypothyroidism 04/25/2016  . Macular degeneration 04/25/2016  . Erectile dysfunction due to arterial insufficiency   . Ischemic heart disease with chronotropic incompetence   . CN (constipation) 02/10/2015  . DD (diverticular disease) 02/10/2015  . Fatigue 02/10/2015  . Lichen planus 56/38/7564  . Restless leg 02/10/2015  . 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 B12 deficiency 02/10/2015  . COPD (chronic obstructive pulmonary disease) ([place]) 11/14/2014  . Post Inflammatory Lung Changes 11/14/2014  . PAF (paroxysmal atrial fibrillation) ([facility]) CHA2DS2-VASc = 4. AC = Eliquis   . Insomnia 12/09/2013  . OSA on CPAP   . Dyspnea on exertion - -essentially resolved with BB dose reduction & wgt loss 03/29/2013  . Overweight (BMI 25.0-29.9) -- Wgt back up 01/17/2013  . Atherosclerotic heart disease of native coronary artery without angina pectoris - s/p CABG, occluded SVG-D1; patent LIMA-LAD, SVG-OM, SVG- RCA   . Essential hypertension   . Hyperlipidemia with target LDL less than 70   . Aorto-iliac disease ([place])   . BCC (basal cell carcinoma), eyelid 01/03/2013  . Allergic rhinitis 08/17/2009  . Adaptation reaction 05/28/2009  . Acid reflux 05/28/2009  . Arthritis, degenerative 05/28/2009  . 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 Carotid artery occlusion without infarction 06/01/2006  . PAD (peripheral artery disease) - bilateral common iliac stents 12/15/2005  . Aortoiliac occlusive disease ([place]) 11/30/2005  . Atherosclerotic heart disease of artery bypass graft 09/01/1997   Janna Arch, PT, DPT   08/12/2018, 2:57 PM  [place] MAIN [facility] SERVICES [address] [place], [place], [address] Phone: [phone]   Fax:  [phone]  Name: [name] MRN: [id number] Date of Birth: [birth year]

## 2018-08-17 ENCOUNTER — Ambulatory Visit: Payer: Medicare Other

## 2018-08-17 DIAGNOSIS — R2681 Unsteadiness on feet: Secondary | ICD-10-CM

## 2018-08-17 DIAGNOSIS — M6281 Muscle weakness (generalized): Secondary | ICD-10-CM | POA: Diagnosis not present

## 2018-08-17 DIAGNOSIS — R2689 Other abnormalities of gait and mobility: Secondary | ICD-10-CM

## 2018-08-17 DIAGNOSIS — R262 Difficulty in walking, not elsewhere classified: Secondary | ICD-10-CM | POA: Diagnosis not present

## 2018-08-17 NOTE — Therapy (Signed)
[place] MAIN [facility] SERVICES [address] [place], [place], [address] Phone: [phone]   Fax:  [phone]  Physical Therapy Treatment  Patient Details  Name: [name] MRN: [id number] Date of Birth: Jul 06, 1934 No data recorded  Encounter Date: 08/17/2018  PT End of Session - 08/17/18 1352    Visit Number  21    Number of Visits  32    Date for PT Re-Evaluation  08/30/18    Authorization Type   1/10 starting 12/12    PT Start Time  1345    PT Stop Time  1430    PT Time Calculation (min)  45 min    Equipment Utilized During Treatment  Gait belt    Activity Tolerance  Patient tolerated treatment well;Patient limited by fatigue    Behavior During Therapy  WFL for tasks assessed/performed       Past Medical History:  Diagnosis Date  . Arthritis   . Atherosclerotic heart disease of native coronary artery without angina pectoris 1998   - s/p CABG, occluded SVG-D1; patent LIMA-LAD, SVG-OM, SVG- RCA  . Basal cell carcinoma of eyelid 01/03/2013   2019 - R side of Nose  . Benign neoplasm of ascending colon   . Benign neoplasm of descending colon   . Cancer [facility])    Skin cancer  . Colon polyps   . COPD (chronic obstructive pulmonary disease) ([place])   . GERD (gastroesophageal reflux disease)   . Hemorrhoid 02/10/2015  . History of hiatal hernia   . HOH (hard of hearing)   . Hypertension   . Memory loss   . Osteoporosis   . PAF (paroxysmal atrial fibrillation) ([place])   . Prostate disorder   . Sciatic pain    Chronic  . Sleep apnea    cpap  . Temporary cerebral vascular dysfunction 02/10/2015   Had negative work-up.  July, 2012.  No medication changes, had forgot them that day, got dehydrated.     Past Surgical History:  Procedure Laterality Date  . ANKLE SURGERY    . Arch aortogram and carotid aortogram  06/02/2006   Dr Oneida Alar did surgery  . BASAL CELL CARCINOMA EXCISION  04/2016   Dermatology  . CARDIAC CATHETERIZATION   01/30/1998    (Dr. Linard Millers): Native LAD & mRCA CTO. 90% D1. Mod Cx. SVG-D1 CTO. SVG-RCA & SVG-OM along with LIMA-LAD patent;  LV NORMAL Fxn  . CAROTID ENDARTERECTOMY Left Oct. 29, 2007   Dr. Oneida Alar:  . CATARACT EXTRACTION W/PHACO Right 03/05/2017   Procedure: CATARACT EXTRACTION PHACO AND INTRAOCULAR LENS PLACEMENT (IOC);  Surgeon: Leandrew Koyanagi, MD;  Location: [facility] ORS;  Service: Ophthalmology;  Laterality: Right;  [place] 00:58.5 AP% 10.6 CDE 6.20 Fluid Pack lot # [id number]  . CATARACT EXTRACTION W/PHACO Left 04/15/2017   Procedure: CATARACT EXTRACTION PHACO AND INTRAOCULAR LENS PLACEMENT ([place])  Left;  Surgeon: Leandrew Koyanagi, MD;  Location: [place];  Service: Ophthalmology;  Laterality: Left;  . COLONOSCOPY WITH PROPOFOL N/A 09/15/2017   Procedure: COLONOSCOPY WITH PROPOFOL;  Surgeon: Lucilla Lame, MD;  Location: [facility] ENDOSCOPY;  Service: Endoscopy;  Laterality: N/A;  . CORONARY ARTERY BYPASS GRAFT  1998   LIMA-LAD, SVG-OM, SVG-RPDA, SVG-DIAG  . CPET / MET  12/2012   Mild chronotropic incompetence - read 82% of predicted; also reduced effort; peak VO2 15.7 / 75% (did not reach Max effort) -- suggested ischemic response in last 1.5 minutes of exercise. Normal pulmonary function on PFTs but poor response  to  . DOPPLER ECHOCARDIOGRAPHY  11/20/2010   EF =>55%; LV norm mild aortic scelorosis  . ESOPHAGOGASTRODUODENOSCOPY (EGD) WITH PROPOFOL N/A 09/15/2017   Procedure: ESOPHAGOGASTRODUODENOSCOPY (EGD) WITH PROPOFOL;  Surgeon: Lucilla Lame, MD;  Location: [facility] ENDOSCOPY;  Service: Endoscopy;  Laterality: N/A;  . FRACTURE SURGERY Right 03/19/2015   wrist  . HIATAL HERNIA REPAIR    . ILIAC ARTERY STENT  12/15/2005   PTA and direct stenting rgt and lft common iliac arteries  . NM GATED MYOVIEW ([place] H[facility])  12/2017   Low Risk - no ischemia or infarct.  EF > 65%. no RWMA  . NM MYOCAR PERF WALL MOTION  11/11/2010; February 2016   a) TM STRESS----NORMAL PERFUSION ,EF 68%; b) EF 50-55%.  Normal LV function. No significant ischemia or infarction.  . ORIF ELBOW FRACTURE Right 01/01/2017   Procedure: OPEN REDUCTION INTERNAL FIXATION (ORIF) ELBOW/OLECRANON FRACTURE;  Surgeon: Hessie Knows, MD;  Location: [facility] ORS;  Service: Orthopedics;  Laterality: Right;  . ORIF WRIST FRACTURE Right 03/19/2015   Procedure: OPEN REDUCTION INTERNAL FIXATION (ORIF) WRIST FRACTURE;  Surgeon: Hessie Knows, MD;  Location: [facility] ORS;  Service: Orthopedics;  Laterality: Right;  . THORACIC AORTA - CAROTID ANGIOGRAM  October 2007   Dr. 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 TRANSTHORACIC ECHOCARDIOGRAM  February 2016   [facility]: Normal LV function. Dilated left atrium.    There were no vitals filed for this visit.  Subjective Assessment - 08/17/18 1351    Subjective  Patient presents to therapy with girlfriend. Reports no falls, LOB, or pain today. Has been compliant with HEP.     Patient is accompained by:  Family member    Pertinent History  Patient goes 5x/week for radiation therapy for prostate cancer. Is getting weaker and no longer goes to the senior center. Just started using cane, when walks a lot his low back and legs ache. He has a significant CV history including quadruple bypass, CAD w cardiac stents, on Eliquis, left carotid endarterectomy, AAA. He is functionally independent but lives alone, his partner lives 5 minutes down the road and seems to assist him with daily living. Activity as tolerated per patient report. Has history of memory deficits.     Limitations  Walking;Standing;House hold activities;Other (comment);Lifting    How long can you sit comfortably?  n/a    How long can you stand comfortably?  needs to hold onto cane.     How long can you walk comfortably?  40 ft     Patient Stated Goals  improve stability    Currently in Pain?  No/denies       Nustep lvl 5 5 minutes >60rpm for cardiovascular  challenge, VCs for maintaining speed    Matrix resisted ambulation : #12.5; lateral, 2x each Direction.    10x STS with basketball toss at top of stand; cues for keeping chest up   Tossing ball up and down for vertical head nods 4x 86 ft with CGA; occasional stumbles no LOB      In //bars   Airex balance beam: side step in 4x length of // bars occasional UE support required.    Modified single limb support: opp LE on soccer ball 2x 30 seconds each LE.    Airex pad: balloon taps reaching inside and outside BOS 2 minutes         Seated RTB PF 10x each LE    Seated adduction  GTB 10x each LE  Seated ABC ankle spell out for LE strength and coordination   Seated soccer ball LAQ kicks for coordination, timing, and strength 10x each LE.    Cues for appropriate form and exercise technique throughout session                      PT Education - 08/17/18 1351    Education provided  Yes    Education Details  exercise technique, stability     Person(s) Educated  Patient    Methods  Explanation;Demonstration;Verbal cues    Comprehension  Verbalized understanding;Returned demonstration       PT Short Term Goals - 08/12/18 1313      PT SHORT TERM GOAL #1   Title  Patient will be independent in home exercise program to improve strength/mobility for better functional independence with ADLs.    Baseline  9/17: HEP given 11/4: compliant at this time    Time  2    Period  Weeks    Status  Partially Met      PT SHORT TERM GOAL #2   Title  Patient will deny any falls over past 2 weeks to demonstrate improved safety awareness at home and work.     Baseline  9/17: progressive weakness 11/4: denies falls last 2 weeks    Time  2    Period  Weeks    Status  Achieved      PT SHORT TERM GOAL #3   Title  Patient will increase BLE gross strength to 4-/5 as to improve functional strength for independent gait, increased standing tolerance and increased ADL ability.     Baseline  9/17: Hip flex: L 3/5 R 3+/5 abd: 2+/5, 3-/5; add 2/5 2+/5 11/4: hip flexion L 3+/5 R 3+/5 abd 2+/5, 2+/5 add 2+/5, 2+/5    Time  2    Period  Weeks    Status  Partially Met        PT Long Term Goals - 08/12/18 1313      PT LONG TERM GOAL #1   Title  Patient will increase BLE gross strength to 4+/5 as to improve functional strength for independent gait, increased standing tolerance and increased ADL ability.    Baseline  9/17: Hip flex: L 3/5 R 3+/5 abd: 2+/5, 3-/5; add 2/5 2+/5 11/4: hip flexion L 3+/5 R 3+/5 abd 2+/5, 2+/5 add 2+/5, 2+/5    Time  8    Period  Weeks    Status  Partially Met      PT LONG TERM GOAL #2   Title  Patient will increase ABC scale score >80% to demonstrate better functional mobility and better confidence with ADLs.     Baseline  9/17: 68.13% 11/4: 76.6%    Time  8    Period  Weeks    Status  Partially Met      PT LONG TERM GOAL #3   Title  Patient will increase Berg Balance score by > 6 points (44/56)  to demonstrate decreased fall risk during functional activities.    Baseline  9/17: 38/56 11/4: 39/56 12/12: 43/56    Time  8    Period  Weeks    Status  Partially Met    Target Date  08/30/18      PT LONG TERM GOAL #4   Title  Patient will increase six minute walk test distance to >1000 for progression to community ambulator and improve gait  ability    Baseline  9/18: 1300 ft w/o cane 11/4: 950f  12/12: 840 ft without cane    Time  8    Period  Weeks    Status  On-going    Target Date  08/30/18      PT LONG TERM GOAL #5   Title  Patient (> 650years old) will complete five times sit to stand test in < 15 seconds without UE support indicating an increased LE strength and improved balance.    Baseline  11/4: requires 1 UE support 12/12:  18 seconds no UE support    Time  8    Period  Weeks    Status  Partially Met            Plan - 08/17/18 1410    Clinical Impression Statement  Patient introduced into resisted walking and was  challenged with fatigue with prolonged mobility indicating limited capacity for functional mobility.  Patient presents with good compliance and motivation to participate in therapy. Patient will continue to benefit from skilled physical therapy to improve strength, balance, and functional mobility    Rehab Potential  Fair    Clinical Impairments Affecting Rehab Potential  (+) good support system, high prior level of function (-) currently undergoing radiation therapy for prostate cancer, memory deficits     PT Frequency  2x / week    PT Duration  8 weeks    PT Treatment/Interventions  ADLs/Self Care Home Management;Aquatic Therapy;Cryotherapy;Moist Heat;Traction;Ultrasound;Therapeutic activities;Functional mobility training;Stair training;Gait training;DME Instruction;Therapeutic exercise;Balance training;Neuromuscular re-education;Patient/family education;Manual techniques;Passive range of motion;Energy conservation;Vestibular;Taping    PT Next Visit Plan  strength, balance     PT Home Exercise Plan  see above    Consulted and Agree with Plan of Care  Patient       Patient will benefit from skilled therapeutic intervention in order to improve the following deficits and impairments:  Abnormal gait, Cardiopulmonary status limiting activity, Decreased activity tolerance, Decreased balance, Decreased knowledge of precautions, Decreased endurance, Decreased coordination, Decreased cognition, Decreased knowledge of use of DME, Decreased mobility, Difficulty walking, Decreased safety awareness, Decreased strength, Impaired flexibility, Impaired perceived functional ability, Impaired sensation, Postural dysfunction, Improper body mechanics, Pain  Visit Diagnosis: Unsteadiness on feet  Muscle weakness (generalized)  Other abnormalities of gait and mobility  Difficulty in walking, not elsewhere classified     Problem List Patient Active Problem List   Diagnosis Date Noted  . Prostate cancer ([place]  02/25/2018  . Prostatic cyst 01/15/2018  . 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 ([place] 09/15/2017  . Blood in stool   . Abnormal feces   . Stricture and stenosis of esophagus   . Mild cognitive impairment 08/18/2017  . Senile purpura ([place] 04/29/2017  . Anemia 04/29/2017  . Frequent falls 04/29/2017  . Benign localized hyperplasia of prostate with urinary obstruction 07/15/2016  . Elevated PSA 07/15/2016  . Incomplete emptying of bladder 07/15/2016  . Urge incontinence 07/15/2016  . Hypothyroidism 04/25/2016  . Macular degeneration 04/25/2016  . Erectile dysfunction due to arterial insufficiency   . Ischemic heart disease with chronotropic incompetence   . CN (constipation) 02/10/2015  . DD (diverticular disease) 02/10/2015  . Fatigue 02/10/2015  . Lichen planus 032/35/5732 . Restless leg 02/10/2015  . 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 B12 deficiency 02/10/2015  . COPD (chronic obstructive pulmonary disease) ([place] 11/14/2014  . Post Inflammatory Lung Changes 11/14/2014  . PAF (paroxysmal atrial fibrillation) ([facility]) CHA2DS2-VASc = 4. AC = Eliquis   .  Insomnia 12/09/2013  . OSA on CPAP   . Dyspnea on exertion - -essentially resolved with BB dose reduction & wgt loss 03/29/2013  . Overweight (BMI 25.0-29.9) -- Wgt back up 01/17/2013  . Atherosclerotic heart disease of native coronary artery without angina pectoris - s/p CABG, occluded SVG-D1; patent LIMA-LAD, SVG-OM, SVG- RCA   . Essential hypertension   . Hyperlipidemia with target LDL less than 70   . Aorto-iliac disease ([place])   . BCC (basal cell carcinoma), eyelid 01/03/2013  . Allergic rhinitis 08/17/2009  . Adaptation reaction 05/28/2009  . Acid reflux 05/28/2009  . Arthritis, degenerative 05/28/2009  . 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 Carotid artery occlusion without infarction 06/01/2006  . PAD (peripheral artery disease) - bilateral common iliac stents 12/15/2005  . Aortoiliac occlusive disease ([place])  11/30/2005  . Atherosclerotic heart disease of artery bypass graft 09/01/1997   Janna Arch, PT, DPT   08/17/2018, 2:30 PM  [place] MAIN [facility] SERVICES [address] [place], [place], [address] Phone: [phone]   Fax:  [phone]  Name: [name] MRN: [id number] Date of Birth: Jan 09, 1934

## 2018-08-19 ENCOUNTER — Ambulatory Visit: Payer: Medicare Other

## 2018-08-19 DIAGNOSIS — R262 Difficulty in walking, not elsewhere classified: Secondary | ICD-10-CM | POA: Diagnosis not present

## 2018-08-19 DIAGNOSIS — R2681 Unsteadiness on feet: Secondary | ICD-10-CM | POA: Diagnosis not present

## 2018-08-19 DIAGNOSIS — R2689 Other abnormalities of gait and mobility: Secondary | ICD-10-CM | POA: Diagnosis not present

## 2018-08-19 DIAGNOSIS — M6281 Muscle weakness (generalized): Secondary | ICD-10-CM | POA: Diagnosis not present

## 2018-08-19 NOTE — Therapy (Signed)
[place] MAIN [facility] SERVICES [address] [place], [place], [address] Phone: [phone]   Fax:  [phone]  Physical Therapy Treatment  Patient Details  Name: [name] MRN: [id number] Date of Birth: 1933/11/03 No data recorded  Encounter Date: 08/19/2018  PT End of Session - 08/19/18 1357    Visit Number  22    Number of Visits  32    Date for PT Re-Evaluation  08/30/18    Authorization Type   2/10 starting 12/12    PT Start Time  1345    PT Stop Time  1430    PT Time Calculation (min)  45 min    Equipment Utilized During Treatment  Gait belt    Activity Tolerance  Patient tolerated treatment well;Patient limited by fatigue    Behavior During Therapy  WFL for tasks assessed/performed       Past Medical History:  Diagnosis Date  . Arthritis   . Atherosclerotic heart disease of native coronary artery without angina pectoris 1998   - s/p CABG, occluded SVG-D1; patent LIMA-LAD, SVG-OM, SVG- RCA  . Basal cell carcinoma of eyelid 01/03/2013   2019 - R side of Nose  . Benign neoplasm of ascending colon   . Benign neoplasm of descending colon   . Cancer [facility])    Skin cancer  . Colon polyps   . COPD (chronic obstructive pulmonary disease) ([place])   . GERD (gastroesophageal reflux disease)   . Hemorrhoid 02/10/2015  . History of hiatal hernia   . HOH (hard of hearing)   . Hypertension   . Memory loss   . Osteoporosis   . PAF (paroxysmal atrial fibrillation) ([place])   . Prostate disorder   . Sciatic pain    Chronic  . Sleep apnea    cpap  . Temporary cerebral vascular dysfunction 02/10/2015   Had negative work-up.  July, 2012.  No medication changes, had forgot them that day, got dehydrated.     Past Surgical History:  Procedure Laterality Date  . ANKLE SURGERY    . Arch aortogram and carotid aortogram  06/02/2006   Dr Oneida Alar did surgery  . BASAL CELL CARCINOMA EXCISION  04/2016   Dermatology  . CARDIAC CATHETERIZATION   01/30/1998    (Dr. Linard Millers): Native LAD & mRCA CTO. 90% D1. Mod Cx. SVG-D1 CTO. SVG-RCA & SVG-OM along with LIMA-LAD patent;  LV NORMAL Fxn  . CAROTID ENDARTERECTOMY Left Oct. 29, 2007   Dr. Oneida Alar:  . CATARACT EXTRACTION W/PHACO Right 03/05/2017   Procedure: CATARACT EXTRACTION PHACO AND INTRAOCULAR LENS PLACEMENT (IOC);  Surgeon: Leandrew Koyanagi, MD;  Location: [facility] ORS;  Service: Ophthalmology;  Laterality: Right;  [place] 00:58.5 AP% 10.6 CDE 6.20 Fluid Pack lot # [id number]  . CATARACT EXTRACTION W/PHACO Left 04/15/2017   Procedure: CATARACT EXTRACTION PHACO AND INTRAOCULAR LENS PLACEMENT ([place])  Left;  Surgeon: Leandrew Koyanagi, MD;  Location: [place];  Service: Ophthalmology;  Laterality: Left;  . COLONOSCOPY WITH PROPOFOL N/A 09/15/2017   Procedure: COLONOSCOPY WITH PROPOFOL;  Surgeon: Lucilla Lame, MD;  Location: [facility] ENDOSCOPY;  Service: Endoscopy;  Laterality: N/A;  . CORONARY ARTERY BYPASS GRAFT  1998   LIMA-LAD, SVG-OM, SVG-RPDA, SVG-DIAG  . CPET / MET  12/2012   Mild chronotropic incompetence - read 82% of predicted; also reduced effort; peak VO2 15.7 / 75% (did not reach Max effort) -- suggested ischemic response in last 1.5 minutes of exercise. Normal pulmonary function on PFTs but poor response  to  . DOPPLER ECHOCARDIOGRAPHY  11/20/2010   EF =>55%; LV norm mild aortic scelorosis  . ESOPHAGOGASTRODUODENOSCOPY (EGD) WITH PROPOFOL N/A 09/15/2017   Procedure: ESOPHAGOGASTRODUODENOSCOPY (EGD) WITH PROPOFOL;  Surgeon: Lucilla Lame, MD;  Location: [facility] ENDOSCOPY;  Service: Endoscopy;  Laterality: N/A;  . FRACTURE SURGERY Right 03/19/2015   wrist  . HIATAL HERNIA REPAIR    . ILIAC ARTERY STENT  12/15/2005   PTA and direct stenting rgt and lft common iliac arteries  . NM GATED MYOVIEW ([facility] [facility])  12/2017   Low Risk - no ischemia or infarct.  EF > 65%. no RWMA  . NM MYOCAR PERF WALL MOTION  11/11/2010; February 2016   a) TM STRESS----NORMAL PERFUSION ,EF 68%; b) EF 50-55%.  Normal LV function. No significant ischemia or infarction.  . ORIF ELBOW FRACTURE Right 01/01/2017   Procedure: OPEN REDUCTION INTERNAL FIXATION (ORIF) ELBOW/OLECRANON FRACTURE;  Surgeon: Hessie Knows, MD;  Location: [facility] ORS;  Service: Orthopedics;  Laterality: Right;  . ORIF WRIST FRACTURE Right 03/19/2015   Procedure: OPEN REDUCTION INTERNAL FIXATION (ORIF) WRIST FRACTURE;  Surgeon: Hessie Knows, MD;  Location: [facility] ORS;  Service: Orthopedics;  Laterality: Right;  . THORACIC AORTA - CAROTID ANGIOGRAM  October 2007   Dr. 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 TRANSTHORACIC ECHOCARDIOGRAM  February 2016   [facility]: Normal LV function. Dilated left atrium.    There were no vitals filed for this visit.  Subjective Assessment - 08/19/18 1353    Subjective  Patient presents to therapy with decreased stability due to not having his pills "for balance". Reports the place he is staying forgot to order more and so he is waiting for more.     Patient is accompained by:  Family member    Pertinent History  Patient goes 5x/week for radiation therapy for prostate cancer. Is getting weaker and no longer goes to the senior center. Just started using cane, when walks a lot his low back and legs ache. He has a significant CV history including quadruple bypass, CAD w cardiac stents, on Eliquis, left carotid endarterectomy, AAA. He is functionally independent but lives alone, his partner lives 5 minutes down the road and seems to assist him with daily living. Activity as tolerated per patient report. Has history of memory deficits.     Limitations  Walking;Standing;House hold activities;Other (comment);Lifting    How long can you sit comfortably?  n/a    How long can you stand comfortably?  needs to hold onto cane.     How long can you walk comfortably?  40 ft     Patient Stated Goals  improve stability    Currently in Pain?   No/denies          Nustep lvl 5 5 minutes >60rpm for cardiovascular challenge, VCs for maintaining speed    Ambulate in hallway with horizontal head turns reading cards 160 ft.   Tossing ball up and down for vertical head nods 4x 86 ft with CGA; occasional stumbles no LOB      In //bars   Airex balance beam: side step in 4x length of // bars occasional UE support required.    Modified single limb support: opp LE on soccer ball 2x 30 seconds each LE.    Airex pad: balloon taps reaching inside and outside BOS 2 minutes   Airex: 4lb bar: press 10x, straight arm raise 10x    6" step 10x  each LE SUE support  Half foam roller stability with finger tip support 2 minutes    seated Seated RTB PF 10x each LE    Seated adduction GTB 10x each LE   Seated ABC ankle spell out for LE strength and coordination     Cues for appropriate form and exercise technique throughout session                       PT Education - 08/19/18 1356    Education provided  Yes    Education Details  exercise technique, stability     Person(s) Educated  Patient    Methods  Explanation;Demonstration;Tactile cues;Verbal cues    Comprehension  Verbalized understanding;Returned demonstration;Tactile cues required;Need further instruction       PT Short Term Goals - 08/12/18 1313      PT SHORT TERM GOAL #1   Title  Patient will be independent in home exercise program to improve strength/mobility for better functional independence with ADLs.    Baseline  9/17: HEP given 11/4: compliant at this time    Time  2    Period  Weeks    Status  Partially Met      PT SHORT TERM GOAL #2   Title  Patient will deny any falls over past 2 weeks to demonstrate improved safety awareness at home and work.     Baseline  9/17: progressive weakness 11/4: denies falls last 2 weeks    Time  2    Period  Weeks    Status  Achieved      PT SHORT TERM GOAL #3   Title  Patient will increase BLE gross  strength to 4-/5 as to improve functional strength for independent gait, increased standing tolerance and increased ADL ability.    Baseline  9/17: Hip flex: L 3/5 R 3+/5 abd: 2+/5, 3-/5; add 2/5 2+/5 11/4: hip flexion L 3+/5 R 3+/5 abd 2+/5, 2+/5 add 2+/5, 2+/5    Time  2    Period  Weeks    Status  Partially Met        PT Long Term Goals - 08/12/18 1313      PT LONG TERM GOAL #1   Title  Patient will increase BLE gross strength to 4+/5 as to improve functional strength for independent gait, increased standing tolerance and increased ADL ability.    Baseline  9/17: Hip flex: L 3/5 R 3+/5 abd: 2+/5, 3-/5; add 2/5 2+/5 11/4: hip flexion L 3+/5 R 3+/5 abd 2+/5, 2+/5 add 2+/5, 2+/5    Time  8    Period  Weeks    Status  Partially Met      PT LONG TERM GOAL #2   Title  Patient will increase ABC scale score >80% to demonstrate better functional mobility and better confidence with ADLs.     Baseline  9/17: 68.13% 11/4: 76.6%    Time  8    Period  Weeks    Status  Partially Met      PT LONG TERM GOAL #3   Title  Patient will increase Berg Balance score by > 6 points (44/56)  to demonstrate decreased fall risk during functional activities.    Baseline  9/17: 38/56 11/4: 39/56 12/12: 43/56    Time  8    Period  Weeks    Status  Partially Met    Target Date  08/30/18      PT LONG TERM GOAL #  4   Title  Patient will increase six minute walk test distance to >1000 for progression to community ambulator and improve gait ability    Baseline  9/18: 1300 ft w/o cane 11/4: 954f  12/12: 840 ft without cane    Time  8    Period  Weeks    Status  On-going    Target Date  08/30/18      PT LONG TERM GOAL #5   Title  Patient (> 622years old) will complete five times sit to stand test in < 15 seconds without UE support indicating an increased LE strength and improved balance.    Baseline  11/4: requires 1 UE support 12/12:  18 seconds no UE support    Time  8    Period  Weeks    Status   Partially Met            Plan - 08/19/18 1414    Clinical Impression Statement  Patient presents with increased loss of stability due to not being able to take his normal medication for stability. More frequent episodes of posterior LOB noted with balloon taps and on unstable surfaces. Patient will continue to benefit from skilled physical therapy to improve strength, balance, and functional mobility    Rehab Potential  Fair    Clinical Impairments Affecting Rehab Potential  (+) good support system, high prior level of function (-) currently undergoing radiation therapy for prostate cancer, memory deficits     PT Frequency  2x / week    PT Duration  8 weeks    PT Treatment/Interventions  ADLs/Self Care Home Management;Aquatic Therapy;Cryotherapy;Moist Heat;Traction;Ultrasound;Therapeutic activities;Functional mobility training;Stair training;Gait training;DME Instruction;Therapeutic exercise;Balance training;Neuromuscular re-education;Patient/family education;Manual techniques;Passive range of motion;Energy conservation;Vestibular;Taping    PT Next Visit Plan  strength, balance     PT Home Exercise Plan  see above    Consulted and Agree with Plan of Care  Patient       Patient will benefit from skilled therapeutic intervention in order to improve the following deficits and impairments:  Abnormal gait, Cardiopulmonary status limiting activity, Decreased activity tolerance, Decreased balance, Decreased knowledge of precautions, Decreased endurance, Decreased coordination, Decreased cognition, Decreased knowledge of use of DME, Decreased mobility, Difficulty walking, Decreased safety awareness, Decreased strength, Impaired flexibility, Impaired perceived functional ability, Impaired sensation, Postural dysfunction, Improper body mechanics, Pain  Visit Diagnosis: Unsteadiness on feet  Muscle weakness (generalized)  Other abnormalities of gait and mobility  Difficulty in walking, not  elsewhere classified     Problem List Patient Active Problem List   Diagnosis Date Noted  . Prostate cancer ([place] 02/25/2018  . Prostatic cyst 01/15/2018  . 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 ([place] 09/15/2017  . Blood in stool   . Abnormal feces   . Stricture and stenosis of esophagus   . Mild cognitive impairment 08/18/2017  . Senile purpura ([place] 04/29/2017  . Anemia 04/29/2017  . Frequent falls 04/29/2017  . Benign localized hyperplasia of prostate with urinary obstruction 07/15/2016  . Elevated PSA 07/15/2016  . Incomplete emptying of bladder 07/15/2016  . Urge incontinence 07/15/2016  . Hypothyroidism 04/25/2016  . Macular degeneration 04/25/2016  . Erectile dysfunction due to arterial insufficiency   . Ischemic heart disease with chronotropic incompetence   . CN (constipation) 02/10/2015  . DD (diverticular disease) 02/10/2015  . Fatigue 02/10/2015  . Lichen planus 016/[id number]/7893 . Restless leg 02/10/2015  . 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 B12 deficiency 02/10/2015  . COPD (chronic obstructive pulmonary disease) (  [place]) 11/14/2014  . Post Inflammatory Lung Changes 11/14/2014  . PAF (paroxysmal atrial fibrillation) ([facility]) CHA2DS2-VASc = 4. AC = Eliquis   . Insomnia 12/09/2013  . OSA on CPAP   . Dyspnea on exertion - -essentially resolved with BB dose reduction & wgt loss 03/29/2013  . Overweight (BMI 25.0-29.9) -- Wgt back up 01/17/2013  . Atherosclerotic heart disease of native coronary artery without angina pectoris - s/p CABG, occluded SVG-D1; patent LIMA-LAD, SVG-OM, SVG- RCA   . Essential hypertension   . Hyperlipidemia with target LDL less than 70   . Aorto-iliac disease ([place])   . BCC (basal cell carcinoma), eyelid 01/03/2013  . Allergic rhinitis 08/17/2009  . Adaptation reaction 05/28/2009  . Acid reflux 05/28/2009  . Arthritis, degenerative 05/28/2009  . 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 Carotid artery occlusion without infarction 06/01/2006   . PAD (peripheral artery disease) - bilateral common iliac stents 12/15/2005  . Aortoiliac occlusive disease ([place]) 11/30/2005  . Atherosclerotic heart disease of artery bypass graft 09/01/1997   Janna Arch, PT, DPT   08/19/2018, 2:38 PM  [place] MAIN [facility] SERVICES [address] [place], [place], [address] Phone: [phone]   Fax:  [phone]  Name: [name] MRN: [id number] Date of Birth: June 02, 1934

## 2018-08-20 ENCOUNTER — Ambulatory Visit: Payer: Medicare Other | Admitting: Cardiology

## 2018-08-24 ENCOUNTER — Ambulatory Visit: Payer: Medicare Other

## 2018-08-30 ENCOUNTER — Telehealth: Payer: Self-pay

## 2018-08-30 ENCOUNTER — Ambulatory Visit (INDEPENDENT_AMBULATORY_CARE_PROVIDER_SITE_OTHER): Payer: Medicare Other

## 2018-08-30 VITALS — BP 136/80 | HR 90 | Temp 97.5°F | Ht 72.0 in | Wt 215.9 lb

## 2018-08-30 DIAGNOSIS — R310 Gross hematuria: Secondary | ICD-10-CM

## 2018-08-30 DIAGNOSIS — C61 Malignant neoplasm of prostate: Secondary | ICD-10-CM | POA: Diagnosis not present

## 2018-08-30 LAB — URINALYSIS, COMPLETE
Bilirubin, UA: NEGATIVE
Glucose, UA: NEGATIVE
Ketones, UA: NEGATIVE
Leukocytes, UA: NEGATIVE
Nitrite, UA: NEGATIVE
Specific Gravity, UA: >1.03 — ABNORMAL HIGH (ref 1.005–1.030)
Urobilinogen, Ur: 0.2 mg/dL (ref 0.2–1.0)
pH, UA: 5 (ref 5.0–7.5)

## 2018-08-30 LAB — MICROSCOPIC EXAMINATION
Epithelial Cells (non renal): NONE SEEN /hpf (ref 0–10)
RBC, UA: >30 /hpf — ABNORMAL HIGH (ref 0–2)
WBC, UA: NONE SEEN /hpf (ref 0–5)

## 2018-08-30 LAB — BLADDER SCAN AMB NON-IMAGING

## 2018-08-30 NOTE — Telephone Encounter (Signed)
Incoming call from family friend (listed on [place]) stating that they patient is passing more blood than he was previously and the patient is concerned. This has been happening for several weeks, the patient has an appointment with Dr.Stoioff scheduled on 09/15/2017. Patient requests to be seen sooner. Advised pt to come in today for UA and CX to rule out infection. Friend voiced understanding. Patient added to schedule. Of note pt is passing no clots.

## 2018-08-30 NOTE — Progress Notes (Signed)
Patient presents in clinic with c/o worsening hematuria. Patient with prostate cancer post radiation. Patient also c/o urinary frequency, urgency, urine leakage. Patient's PVR today is 3mL, performed to rule out clot retention. UA today shows blood, however no infection. Will send for cx and call with results. Advised pt to keep f/u scheduled for 09/15/2018 with Dr. Bernardo Heater. Advised pt of the likelihood that symptoms are caused by radiation. Pt gave verbal understanding.

## 2018-08-31 ENCOUNTER — Ambulatory Visit: Payer: Medicare Other

## 2018-08-31 DIAGNOSIS — M6281 Muscle weakness (generalized): Secondary | ICD-10-CM | POA: Diagnosis not present

## 2018-08-31 DIAGNOSIS — R2689 Other abnormalities of gait and mobility: Secondary | ICD-10-CM | POA: Diagnosis not present

## 2018-08-31 DIAGNOSIS — R2681 Unsteadiness on feet: Secondary | ICD-10-CM | POA: Diagnosis not present

## 2018-08-31 DIAGNOSIS — R262 Difficulty in walking, not elsewhere classified: Secondary | ICD-10-CM

## 2018-08-31 NOTE — Therapy (Signed)
[place] MAIN [facility] SERVICES [address] [place], [place], [address] Phone: [phone]   Fax:  [phone]  Physical Therapy Treatment and Recertification  (progress note performed 08/12/18) Physical Therapy Progress Note  Dates of reporting period 07/05/2018  to   08/31/2018    Patient Details  Name: [name] MRN: [id number] Date of Birth: [birth year] No data recorded  Encounter Date: 08/31/2018  PT End of Session - 08/31/18 1527    Visit Number  23    Number of Visits  32    Date for PT Re-Evaluation  10/26/18    Authorization Type   3/10 starting 12/12    PT Start Time  1516    PT Stop Time  1601    PT Time Calculation (min)  45 min    Equipment Utilized During Treatment  Gait belt    Activity Tolerance  Patient tolerated treatment well;Patient limited by fatigue    Behavior During Therapy  WFL for tasks assessed/performed       Past Medical History:  Diagnosis Date  . Arthritis   . Atherosclerotic heart disease of native coronary artery without angina pectoris 1998   - s/p CABG, occluded SVG-D1; patent LIMA-LAD, SVG-OM, SVG- RCA  . Basal cell carcinoma of eyelid 01/03/2013   2019 - R side of Nose  . Benign neoplasm of ascending colon   . Benign neoplasm of descending colon   . Cancer [facility])    Skin cancer  . Colon polyps   . COPD (chronic obstructive pulmonary disease) ([place])   . GERD (gastroesophageal reflux disease)   . Hemorrhoid 02/10/2015  . History of hiatal hernia   . HOH (hard of hearing)   . Hypertension   . Memory loss   . Osteoporosis   . PAF (paroxysmal atrial fibrillation) ([place])   . Prostate disorder   . Sciatic pain    Chronic  . Sleep apnea    cpap  . Temporary cerebral vascular dysfunction 02/10/2015   Had negative work-up.  July, 2012.  No medication changes, had forgot them that day, got dehydrated.     Past Surgical History:  Procedure Laterality Date  . ANKLE SURGERY    . Arch  aortogram and carotid aortogram  06/02/2006   Dr Oneida Alar did surgery  . BASAL CELL CARCINOMA EXCISION  04/2016   Dermatology  . CARDIAC CATHETERIZATION  01/30/1998    (Dr. Linard Millers): Native LAD & mRCA CTO. 90% D1. Mod Cx. SVG-D1 CTO. SVG-RCA & SVG-OM along with LIMA-LAD patent;  LV NORMAL Fxn  . CAROTID ENDARTERECTOMY Left Oct. 29, 2007   Dr. Oneida Alar:  . CATARACT EXTRACTION W/PHACO Right 03/05/2017   Procedure: CATARACT EXTRACTION PHACO AND INTRAOCULAR LENS PLACEMENT (IOC);  Surgeon: Leandrew Koyanagi, MD;  Location: [facility] ORS;  Service: Ophthalmology;  Laterality: Right;  [place] 00:58.5 AP% 10.6 CDE 6.20 Fluid Pack lot # [id number]  . CATARACT EXTRACTION W/PHACO Left 04/15/2017   Procedure: CATARACT EXTRACTION PHACO AND INTRAOCULAR LENS PLACEMENT ([place])  Left;  Surgeon: Leandrew Koyanagi, MD;  Location: [place];  Service: Ophthalmology;  Laterality: Left;  . COLONOSCOPY WITH PROPOFOL N/A 09/15/2017   Procedure: COLONOSCOPY WITH PROPOFOL;  Surgeon: Lucilla Lame, MD;  Location: [facility] ENDOSCOPY;  Service: Endoscopy;  Laterality: N/A;  . CORONARY ARTERY BYPASS GRAFT  1998   LIMA-LAD, SVG-OM, SVG-RPDA, SVG-DIAG  . CPET / MET  12/2012   Mild chronotropic incompetence - read 82% of predicted; also reduced effort; peak VO2 15.7 /  75% (did not reach Max effort) -- suggested ischemic response in last 1.5 minutes of exercise. Normal pulmonary function on PFTs but poor response to  . DOPPLER ECHOCARDIOGRAPHY  11/20/2010   EF =>55%; LV norm mild aortic scelorosis  . ESOPHAGOGASTRODUODENOSCOPY (EGD) WITH PROPOFOL N/A 09/15/2017   Procedure: ESOPHAGOGASTRODUODENOSCOPY (EGD) WITH PROPOFOL;  Surgeon: Lucilla Lame, MD;  Location[facility];  Service: Endoscopy;  Laterality: N/A;  . FRACTURE SURGERY Right 03/19/2015   wrist  . HIATAL HERNIA REPAIR    . ILIAC ARTERY STENT  12/15/2005   PTA and direct stenting rgt and lft common iliac arteries  . NM GATED MYOVIEW ([place] [facility])  12/2017   Low Risk - no  ischemia or infarct.  EF > 65%. no RWMA  . NM MYOCAR PERF WALL MOTION  11/11/2010; February 2016   a) TM STRESS----NORMAL PERFUSION ,EF 68%; b) EF 50-55%. Normal LV function. No significant ischemia or infarction.  . ORIF ELBOW FRACTURE Right 01/01/2017   Procedure: OPEN REDUCTION INTERNAL FIXATION (ORIF) ELBOW/OLECRANON FRACTURE;  Surgeon: Hessie Knows, MD;  Location: [facility] ORS;  Service: Orthopedics;  Laterality: Right;  . ORIF WRIST FRACTURE Right 03/19/2015   Procedure: OPEN REDUCTION INTERNAL FIXATION (ORIF) WRIST FRACTURE;  Surgeon: Hessie Knows, MD;  Location: [facility] ORS;  Service: Orthopedics;  Laterality: Right;  . THORACIC AORTA - CAROTID ANGIOGRAM  October 2007   Dr. 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 TRANSTHORACIC ECHOCARDIOGRAM  February 2016   [facility]: Normal LV function. Dilated left atrium.    There were no vitals filed for this visit.  Subjective Assessment - 08/31/18 1523    Subjective  Patient reported no pain at start of session. No recent falls.    Pertinent History  Patient goes 5x/week for radiation therapy for prostate cancer. Is getting weaker and no longer goes to the senior center. Just started using cane, when walks a lot his low back and legs ache. He has a significant CV history including quadruple bypass, CAD w cardiac stents, on Eliquis, left carotid endarterectomy, AAA. He is functionally independent but lives alone, his partner lives 5 minutes down the road and seems to assist him with daily living. Activity as tolerated per patient report. Has history of memory deficits.     Limitations  Walking;Standing;House hold activities;Other (comment);Lifting    How long can you sit comfortably?  n/a    How long can you stand comfortably?  Reports that he has not used his cane in about 1 month    How long can you walk comfortably?  200-300 yards    Patient Stated Goals  Pt would still  like to work on his balance    Currently in Pain?  No/denies    Pain Onset  In the past 7 days         Treatment:   Nustep lvl 5 5 minutes >60rpm for cardiovascular challenge, VCs for maintaining speed.   Gait training:  Ambulate in hallway with horizontal head turns reading cards 142f x3. Verbal cues for heel stirke, L arm swing, gait velocity   Ambulate in hall with vertical head turns, cues for heel strike, arm swing   Neuromuscular re-education:  In // bars   Airex balance beam: side step in 4x length of // bars occasional UE support required.    Airex pad: balloon taps reaching inside and outside BOS 2 minutes, SUE support 80% of the time   Airex:  4lb bar: press 10x, straight arm raise 10x    One foot forward, ea on foam 3x30s ea with intermittent UE support  6" step 10x each LE no SUE support  6" step taps laterally x15 bilaterally no UE support  Pt needed several sitting rest breaks during session due to fatigue. Educated on breathing technique.     PT Education - 08/31/18 1525    Education provided  Yes    Education Details  exercise technique/stability    Person(s) Educated  Patient    Methods  Explanation;Tactile cues;Verbal cues    Comprehension  Verbalized understanding;Returned demonstration       PT Short Term Goals - 08/12/18 1313      PT SHORT TERM GOAL #1   Title  Patient will be independent in home exercise program to improve strength/mobility for better functional independence with ADLs.    Baseline  9/17: HEP given 11/4: compliant at this time    Time  2    Period  Weeks    Status  Partially Met      PT SHORT TERM GOAL #2   Title  Patient will deny any falls over past 2 weeks to demonstrate improved safety awareness at home and work.     Baseline  9/17: progressive weakness 11/4: denies falls last 2 weeks    Time  2    Period  Weeks    Status  Achieved      PT SHORT TERM GOAL #3   Title  Patient will increase BLE gross strength to 4-/5  as to improve functional strength for independent gait, increased standing tolerance and increased ADL ability.    Baseline  9/17: Hip flex: L 3/5 R 3+/5 abd: 2+/5, 3-/5; add 2/5 2+/5 11/4: hip flexion L 3+/5 R 3+/5 abd 2+/5, 2+/5 add 2+/5, 2+/5    Time  2    Period  Weeks    Status  Partially Met        PT Long Term Goals - 08/12/18 1313      PT LONG TERM GOAL #1   Title  Patient will increase BLE gross strength to 4+/5 as to improve functional strength for independent gait, increased standing tolerance and increased ADL ability.    Baseline  9/17: Hip flex: L 3/5 R 3+/5 abd: 2+/5, 3-/5; add 2/5 2+/5 11/4: hip flexion L 3+/5 R 3+/5 abd 2+/5, 2+/5 add 2+/5, 2+/5    Time  8    Period  Weeks    Status  Partially Met      PT LONG TERM GOAL #2   Title  Patient will increase ABC scale score >80% to demonstrate better functional mobility and better confidence with ADLs.     Baseline  9/17: 68.13% 11/4: 76.6%    Time  8    Period  Weeks    Status  Partially Met      PT LONG TERM GOAL #3   Title  Patient will increase Berg Balance score by > 6 points (44/56)  to demonstrate decreased fall risk during functional activities.    Baseline  9/17: 38/56 11/4: 39/56 12/12: 43/56    Time  8    Period  Weeks    Status  Partially Met    Target Date  08/30/18      PT LONG TERM GOAL #4   Title  Patient will increase six minute walk test distance to >1000 for progression to community ambulator and improve gait ability  Baseline  9/18: 1300 ft w/o cane 11/4: 984f  12/12: 840 ft without cane    Time  8    Period  Weeks    Status  On-going    Target Date  08/30/18      PT LONG TERM GOAL #5   Title  Patient (> 681years old) will complete five times sit to stand test in < 15 seconds without UE support indicating an increased LE strength and improved balance.    Baseline  11/4: requires 1 UE support 12/12:  18 seconds no UE support    Time  8    Period  Weeks    Status  Partially Met             Plan - 08/31/18 1526    Clinical Impression Statement  Patient reported that overall therapy has gone well. Feels like his walking and balance has improved, but thinks it can still be better. Patient is still progressing towards goals, and is now ambulating without SPC. Pt still exhibits deficits in strength, balance, ambulation, and functional activities and would benefit from further skilled PT intervention to address these and decrease fall risk.     Rehab Potential  Fair    Clinical Impairments Affecting Rehab Potential  (+) good support system, high prior level of function (-) currently undergoing radiation therapy for prostate cancer, memory deficits     PT Frequency  2x / week    PT Duration  8 weeks    PT Treatment/Interventions  ADLs/Self Care Home Management;Aquatic Therapy;Cryotherapy;Moist Heat;Traction;Ultrasound;Therapeutic activities;Functional mobility training;Stair training;Gait training;DME Instruction;Therapeutic exercise;Balance training;Neuromuscular re-education;Patient/family education;Manual techniques;Passive range of motion;Energy conservation;Vestibular;Taping    PT Next Visit Plan  strength, balance     PT Home Exercise Plan  see above    Consulted and Agree with Plan of Care  Patient       Patient will benefit from skilled therapeutic intervention in order to improve the following deficits and impairments:  Abnormal gait, Cardiopulmonary status limiting activity, Decreased activity tolerance, Decreased balance, Decreased knowledge of precautions, Decreased endurance, Decreased coordination, Decreased cognition, Decreased knowledge of use of DME, Decreased mobility, Difficulty walking, Decreased safety awareness, Decreased strength, Impaired flexibility, Impaired perceived functional ability, Impaired sensation, Postural dysfunction, Improper body mechanics, Pain  Visit Diagnosis: Unsteadiness on feet  Muscle weakness (generalized)  Other abnormalities  of gait and mobility  Difficulty in walking, not elsewhere classified     Problem List Patient Active Problem List   Diagnosis Date Noted  . Prostate cancer ([place] 02/25/2018  . Prostatic cyst 01/15/2018  . 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 ([place] 09/15/2017  . Blood in stool   . Abnormal feces   . Stricture and stenosis of esophagus   . Mild cognitive impairment 08/18/2017  . Senile purpura ([place] 04/29/2017  . Anemia 04/29/2017  . Frequent falls 04/29/2017  . Benign localized hyperplasia of prostate with urinary obstruction 07/15/2016  . Elevated PSA 07/15/2016  . Incomplete emptying of bladder 07/15/2016  . Urge incontinence 07/15/2016  . Hypothyroidism 04/25/2016  . Macular degeneration 04/25/2016  . Erectile dysfunction due to arterial insufficiency   . Ischemic heart disease with chronotropic incompetence   . CN (constipation) 02/10/2015  . DD (diverticular disease) 02/10/2015  . Fatigue 02/10/2015  . Lichen planus 054/27/0623 . Restless leg 02/10/2015  . 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 B12 deficiency 02/10/2015  . COPD (chronic obstructive pulmonary disease) ([place] 11/14/2014  . Post Inflammatory Lung Changes 11/14/2014  . PAF (paroxysmal atrial  fibrillation) ([facility]) CHA2DS2-VASc = 4. AC = Eliquis   . Insomnia 12/09/2013  . OSA on CPAP   . Dyspnea on exertion - -essentially resolved with BB dose reduction & wgt loss 03/29/2013  . Overweight (BMI 25.0-29.9) -- Wgt back up 01/17/2013  . Atherosclerotic heart disease of native coronary artery without angina pectoris - s/p CABG, occluded SVG-D1; patent LIMA-LAD, SVG-OM, SVG- RCA   . Essential hypertension   . Hyperlipidemia with target LDL less than 70   . Aorto-iliac disease ([place])   . BCC (basal cell carcinoma), eyelid 01/03/2013  . Allergic rhinitis 08/17/2009  . Adaptation reaction 05/28/2009  . Acid reflux 05/28/2009  . Arthritis, degenerative 05/28/2009  . 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  Carotid artery occlusion without infarction 06/01/2006  . PAD (peripheral artery disease) - bilateral common iliac stents 12/15/2005  . Aortoiliac occlusive disease ([place]) 11/30/2005  . Atherosclerotic heart disease of artery bypass graft 09/01/1997    Lieutenant Diego PT, DPT 5:00 PM,08/31/18 [place] MAIN [facility] SERVICES [address] [place], [place], [address] Phone: [phone]   Fax:  [phone]  Name: [name] MRN: [id number] Date of Birth: 08/12/1934

## 2018-09-01 LAB — CULTURE, URINE COMPREHENSIVE

## 2018-09-07 ENCOUNTER — Ambulatory Visit: Payer: Medicare Other | Attending: Radiation Oncology

## 2018-09-07 DIAGNOSIS — R262 Difficulty in walking, not elsewhere classified: Secondary | ICD-10-CM | POA: Diagnosis not present

## 2018-09-07 DIAGNOSIS — R2689 Other abnormalities of gait and mobility: Secondary | ICD-10-CM

## 2018-09-07 DIAGNOSIS — R2681 Unsteadiness on feet: Secondary | ICD-10-CM

## 2018-09-07 DIAGNOSIS — M6281 Muscle weakness (generalized): Secondary | ICD-10-CM

## 2018-09-07 NOTE — Therapy (Signed)
[place] MAIN [facility] SERVICES [address] [place], [place], [address] Phone: [phone]   Fax:  [phone]  Physical Therapy Treatment  Patient Details  Name: [name] MRN: [id number] Date of Birth: 09/28/1933 No data recorded  Encounter Date: 09/07/2018  PT End of Session - 09/07/18 1029    Visit Number  24    Number of Visits  32    Date for PT Re-Evaluation  10/26/18    Authorization Type   4/10 starting 12/12    PT Start Time  1030    PT Stop Time  1114    PT Time Calculation (min)  44 min    Equipment Utilized During Treatment  Gait belt    Activity Tolerance  Patient tolerated treatment well;Patient limited by fatigue    Behavior During Therapy  WFL for tasks assessed/performed       Past Medical History:  Diagnosis Date  . Arthritis   . Atherosclerotic heart disease of native coronary artery without angina pectoris 1998   - s/p CABG, occluded SVG-D1; patent LIMA-LAD, SVG-OM, SVG- RCA  . Basal cell carcinoma of eyelid 01/03/2013   2019 - R side of Nose  . Benign neoplasm of ascending colon   . Benign neoplasm of descending colon   . Cancer [facility])    Skin cancer  . Colon polyps   . COPD (chronic obstructive pulmonary disease) ([place])   . GERD (gastroesophageal reflux disease)   . Hemorrhoid 02/10/2015  . History of hiatal hernia   . HOH (hard of hearing)   . Hypertension   . Memory loss   . Osteoporosis   . PAF (paroxysmal atrial fibrillation) ([place])   . Prostate disorder   . Sciatic pain    Chronic  . Sleep apnea    cpap  . Temporary cerebral vascular dysfunction 02/10/2015   Had negative work-up.  July, 2012.  No medication changes, had forgot them that day, got dehydrated.     Past Surgical History:  Procedure Laterality Date  . ANKLE SURGERY    . Arch aortogram and carotid aortogram  06/02/2006   Dr Oneida Alar did surgery  . BASAL CELL CARCINOMA EXCISION  04/2016   Dermatology  . CARDIAC CATHETERIZATION   01/30/1998    (Dr. Linard Millers): Native LAD & mRCA CTO. 90% D1. Mod Cx. SVG-D1 CTO. SVG-RCA & SVG-OM along with LIMA-LAD patent;  LV NORMAL Fxn  . CAROTID ENDARTERECTOMY Left Oct. 29, 2007   Dr. Oneida Alar:  . CATARACT EXTRACTION W/PHACO Right 03/05/2017   Procedure: CATARACT EXTRACTION PHACO AND INTRAOCULAR LENS PLACEMENT (IOC);  Surgeon: Leandrew Koyanagi, MD;  Location: [facility] ORS;  Service: Ophthalmology;  Laterality: Right;  [place] 00:58.5 AP% 10.6 CDE 6.20 Fluid Pack lot # [id number]  . CATARACT EXTRACTION W/PHACO Left 04/15/2017   Procedure: CATARACT EXTRACTION PHACO AND INTRAOCULAR LENS PLACEMENT ([place])  Left;  Surgeon: Leandrew Koyanagi, MD;  Location: [place];  Service: Ophthalmology;  Laterality: Left;  . COLONOSCOPY WITH PROPOFOL N/A 09/15/2017   Procedure: COLONOSCOPY WITH PROPOFOL;  Surgeon: Lucilla Lame, MD;  Location: [facility] ENDOSCOPY;  Service: Endoscopy;  Laterality: N/A;  . CORONARY ARTERY BYPASS GRAFT  1998   LIMA-LAD, SVG-OM, SVG-RPDA, SVG-DIAG  . CPET / MET  12/2012   Mild chronotropic incompetence - read 82% of predicted; also reduced effort; peak VO2 15.7 / 75% (did not reach Max effort) -- suggested ischemic response in last 1.5 minutes of exercise. Normal pulmonary function on PFTs but poor response  to  . DOPPLER ECHOCARDIOGRAPHY  11/20/2010   EF =>55%; LV norm mild aortic scelorosis  . ESOPHAGOGASTRODUODENOSCOPY (EGD) WITH PROPOFOL N/A 09/15/2017   Procedure: ESOPHAGOGASTRODUODENOSCOPY (EGD) WITH PROPOFOL;  Surgeon: Lucilla Lame, MD;  Location: [facility] ENDOSCOPY;  Service: Endoscopy;  Laterality: N/A;  . FRACTURE SURGERY Right 03/19/2015   wrist  . HIATAL HERNIA REPAIR    . ILIAC ARTERY STENT  12/15/2005   PTA and direct stenting rgt and lft common iliac arteries  . NM GATED MYOVIEW ([place] HX)  12/2017   Low Risk - no ischemia or infarct.  EF > 65%. no RWMA  . NM MYOCAR PERF WALL MOTION  11/11/2010; February 2016   a) TM STRESS----NORMAL PERFUSION ,EF 68%; b) EF 50-55%.  Normal LV function. No significant ischemia or infarction.  . ORIF ELBOW FRACTURE Right 01/01/2017   Procedure: OPEN REDUCTION INTERNAL FIXATION (ORIF) ELBOW/OLECRANON FRACTURE;  Surgeon: Hessie Knows, MD;  Location: [facility] ORS;  Service: Orthopedics;  Laterality: Right;  . ORIF WRIST FRACTURE Right 03/19/2015   Procedure: OPEN REDUCTION INTERNAL FIXATION (ORIF) WRIST FRACTURE;  Surgeon: Hessie Knows, MD;  Location: [facility] ORS;  Service: Orthopedics;  Laterality: Right;  . THORACIC AORTA - CAROTID ANGIOGRAM  October 2007   Dr. 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 TRANSTHORACIC ECHOCARDIOGRAM  February 2016   [facility]: Normal LV function. Dilated left atrium.    There were no vitals filed for this visit.  Subjective Assessment - 09/07/18 1033    Subjective  Patient is having R back pain since last session and occasional R knee pain. Reports nothing has been making the pain better besides laying down. When he moves it starts hurting.     Pertinent History  Patient goes 5x/week for radiation therapy for prostate cancer. Is getting weaker and no longer goes to the senior center. Just started using cane, when walks a lot his low back and legs ache. He has a significant CV history including quadruple bypass, CAD w cardiac stents, on Eliquis, left carotid endarterectomy, AAA. He is functionally independent but lives alone, his partner lives 5 minutes down the road and seems to assist him with daily living. Activity as tolerated per patient report. Has history of memory deficits.     Limitations  Walking;Standing;House hold activities;Other (comment);Lifting    How long can you sit comfortably?  n/a    How long can you stand comfortably?  Reports that he has not used his cane in about 1 month    How long can you walk comfortably?  200-300 yards    Patient Stated Goals  Pt would still like to work on his balance     Currently in Pain?  Yes    Pain Score  5     Pain Location  Back    Pain Orientation  Right;Lower    Pain Descriptors / Indicators  Aching;Nagging    Pain Type  Acute pain    Pain Onset  In the past 7 days    Pain Frequency  Constant    Aggravating Factors   movement, walking    Pain Relieving Factors  laying down    Effect of Pain on Daily Activities  limits movement        Nustep lvl 5 5 minutes >60rpm for cardiovascular challenge, VCs for maintaining speed    Supine on mat table: LE rotation to reduce low back pain: 2 minutes Hamstring stretch 2x 60  seconds each LE  Single knee to chest 1x 60 seconds each LE GTB clamshells 15x with 3 second holds Adduction 15x 3 second holds GTB marches 15x each LE  Posterior pelvic tilts 10x    In //bars    squat 10x    Airex pad: balloon taps reaching inside and outside BOS 2 minutes   Half foam roller stability with finger tip support 2 minutes    seated Seated RTB PF 10x each LE   Seated hamstring stretch 60 seconds each LE.   Seated adduction GTB 10x each LE    Cues for appropriate form and exercise technique throughout session   Low back Hep given:    Access Code: [email or web address]  URL: [email or web address]  Date: 09/07/2018  Prepared by: Janna Arch   Exercises  Hip Flexion Stretch - 2 reps - 2 sets - 30 hold - 1x daily - 7x weekly  Seated Hamstring Stretch - 2 reps - 2 sets - 30 hold - 1x daily - 7x weekly  Supine Posterior Pelvic Tilt - 10 reps - 2 sets - 5 hold - 1x daily - 7x weekly  Hooklying Lumbar Rotation - 10 reps - 2 sets - 5 hold - 1x daily - 7x weekly                     PT Education - 09/07/18 1029    Education provided  Yes    Education Details  exercise technique, stability     Person(s) Educated  Patient    Methods  Explanation;Demonstration;Verbal cues;Tactile cues    Comprehension  Verbalized understanding;Returned demonstration;Tactile cues required;Need further  instruction;Verbal cues required       PT Short Term Goals - 08/12/18 1313      PT SHORT TERM GOAL #1   Title  Patient will be independent in home exercise program to improve strength/mobility for better functional independence with ADLs.    Baseline  9/17: HEP given 11/4: compliant at this time    Time  2    Period  Weeks    Status  Partially Met      PT SHORT TERM GOAL #2   Title  Patient will deny any falls over past 2 weeks to demonstrate improved safety awareness at home and work.     Baseline  9/17: progressive weakness 11/4: denies falls last 2 weeks    Time  2    Period  Weeks    Status  Achieved      PT SHORT TERM GOAL #3   Title  Patient will increase BLE gross strength to 4-/5 as to improve functional strength for independent gait, increased standing tolerance and increased ADL ability.    Baseline  9/17: Hip flex: L 3/5 R 3+/5 abd: 2+/5, 3-/5; add 2/5 2+/5 11/4: hip flexion L 3+/5 R 3+/5 abd 2+/5, 2+/5 add 2+/5, 2+/5    Time  2    Period  Weeks    Status  Partially Met        PT Long Term Goals - 08/12/18 1313      PT LONG TERM GOAL #1   Title  Patient will increase BLE gross strength to 4+/5 as to improve functional strength for independent gait, increased standing tolerance and increased ADL ability.    Baseline  9/17: Hip flex: L 3/5 R 3+/5 abd: 2+/5, 3-/5; add 2/5 2+/5 11/4: hip flexion L 3+/5 R 3+/5 abd 2+/5, 2+/5 add 2+/5, 2+/5    Time  8    Period  Weeks    Status  Partially Met      PT LONG TERM GOAL #2   Title  Patient will increase ABC scale score >80% to demonstrate better functional mobility and better confidence with ADLs.     Baseline  9/17: 68.13% 11/4: 76.6%    Time  8    Period  Weeks    Status  Partially Met      PT LONG TERM GOAL #3   Title  Patient will increase Berg Balance score by > 6 points (44/56)  to demonstrate decreased fall risk during functional activities.    Baseline  9/17: 38/56 11/4: 39/56 12/12: 43/56    Time  8     Period  Weeks    Status  Partially Met    Target Date  08/30/18      PT LONG TERM GOAL #4   Title  Patient will increase six minute walk test distance to >1000 for progression to community ambulator and improve gait ability    Baseline  9/18: 1300 ft w/o cane 11/4: 92f  12/12: 840 ft without cane    Time  8    Period  Weeks    Status  On-going    Target Date  08/30/18      PT LONG TERM GOAL #5   Title  Patient (> 672years old) will complete five times sit to stand test in < 15 seconds without UE support indicating an increased LE strength and improved balance.    Baseline  11/4: requires 1 UE support 12/12:  18 seconds no UE support    Time  8    Period  Weeks    Status  Partially Met            Plan - 09/07/18 1102    Clinical Impression Statement  Patient's low back pain limited session, requiring interventions to decrease pain. Strengthening performed supine to reduce aggravation to lower back. Back pain reduced by end of session. Patient will continue to benefit from skilled physical therapy to improve strength, balance, and functional mobility    Rehab Potential  Fair    Clinical Impairments Affecting Rehab Potential  (+) good support system, high prior level of function (-) currently undergoing radiation therapy for prostate cancer, memory deficits     PT Frequency  2x / week    PT Duration  8 weeks    PT Treatment/Interventions  ADLs/Self Care Home Management;Aquatic Therapy;Cryotherapy;Moist Heat;Traction;Ultrasound;Therapeutic activities;Functional mobility training;Stair training;Gait training;DME Instruction;Therapeutic exercise;Balance training;Neuromuscular re-education;Patient/family education;Manual techniques;Passive range of motion;Energy conservation;Vestibular;Taping    PT Next Visit Plan  strength, balance     PT Home Exercise Plan  see above    Consulted and Agree with Plan of Care  Patient       Patient will benefit from skilled therapeutic intervention  in order to improve the following deficits and impairments:  Abnormal gait, Cardiopulmonary status limiting activity, Decreased activity tolerance, Decreased balance, Decreased knowledge of precautions, Decreased endurance, Decreased coordination, Decreased cognition, Decreased knowledge of use of DME, Decreased mobility, Difficulty walking, Decreased safety awareness, Decreased strength, Impaired flexibility, Impaired perceived functional ability, Impaired sensation, Postural dysfunction, Improper body mechanics, Pain  Visit Diagnosis: Unsteadiness on feet  Muscle weakness (generalized)  Other abnormalities of gait and mobility  Difficulty in walking, not elsewhere classified     Problem List Patient Active Problem List   Diagnosis Date Noted  . Prostate cancer ([place] 02/25/2018  . Prostatic  cyst 01/15/2018  . 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 ([place]) 09/15/2017  . Blood in stool   . Abnormal feces   . Stricture and stenosis of esophagus   . Mild cognitive impairment 08/18/2017  . Senile purpura ([place]) 04/29/2017  . Anemia 04/29/2017  . Frequent falls 04/29/2017  . Benign localized hyperplasia of prostate with urinary obstruction 07/15/2016  . Elevated PSA 07/15/2016  . Incomplete emptying of bladder 07/15/2016  . Urge incontinence 07/15/2016  . Hypothyroidism 04/25/2016  . Macular degeneration 04/25/2016  . Erectile dysfunction due to arterial insufficiency   . Ischemic heart disease with chronotropic incompetence   . CN (constipation) 02/10/2015  . DD (diverticular disease) 02/10/2015  . Fatigue 02/10/2015  . Lichen planus 49/17/9150  . Restless leg 02/10/2015  . 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 B12 deficiency 02/10/2015  . COPD (chronic obstructive pulmonary disease) ([place]) 11/14/2014  . Post Inflammatory Lung Changes 11/14/2014  . PAF (paroxysmal atrial fibrillation) ([facility]) CHA2DS2-VASc = 4. AC = Eliquis   . Insomnia 12/09/2013  . OSA on CPAP   . Dyspnea on exertion  - -essentially resolved with BB dose reduction & wgt loss 03/29/2013  . Overweight (BMI 25.0-29.9) -- Wgt back up 01/17/2013  . Atherosclerotic heart disease of native coronary artery without angina pectoris - s/p CABG, occluded SVG-D1; patent LIMA-LAD, SVG-OM, SVG- RCA   . Essential hypertension   . Hyperlipidemia with target LDL less than 70   . Aorto-iliac disease ([place])   . BCC (basal cell carcinoma), eyelid 01/03/2013  . Allergic rhinitis 08/17/2009  . Adaptation reaction 05/28/2009  . Acid reflux 05/28/2009  . Arthritis, degenerative 05/28/2009  . 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 Carotid artery occlusion without infarction 06/01/2006  . PAD (peripheral artery disease) - bilateral common iliac stents 12/15/2005  . Aortoiliac occlusive disease ([place]) 11/30/2005  . Atherosclerotic heart disease of artery bypass graft 09/01/1997   Janna Arch, PT, DPT   09/07/2018, 11:15 AM  [place] MAIN [facility] SERVICES [address] [place], [place], [address] Phone: [phone]   Fax:  [phone]  Name: ADONI GREENOUGH MRN: [id number] Date of Birth: 11-02-1933

## 2018-09-09 DIAGNOSIS — S51002A Unspecified open wound of left elbow, initial encounter: Secondary | ICD-10-CM | POA: Diagnosis not present

## 2018-09-10 ENCOUNTER — Other Ambulatory Visit: Payer: Self-pay | Admitting: Internal Medicine

## 2018-09-10 ENCOUNTER — Other Ambulatory Visit: Payer: Self-pay | Admitting: Family Medicine

## 2018-09-10 DIAGNOSIS — E785 Hyperlipidemia, unspecified: Secondary | ICD-10-CM

## 2018-09-13 ENCOUNTER — Ambulatory Visit: Payer: Medicare Other

## 2018-09-13 DIAGNOSIS — R262 Difficulty in walking, not elsewhere classified: Secondary | ICD-10-CM | POA: Diagnosis not present

## 2018-09-13 DIAGNOSIS — M6281 Muscle weakness (generalized): Secondary | ICD-10-CM | POA: Diagnosis not present

## 2018-09-13 DIAGNOSIS — R2689 Other abnormalities of gait and mobility: Secondary | ICD-10-CM | POA: Diagnosis not present

## 2018-09-13 DIAGNOSIS — R2681 Unsteadiness on feet: Secondary | ICD-10-CM | POA: Diagnosis not present

## 2018-09-13 NOTE — Therapy (Signed)
[place] MAIN [facility] SERVICES [address] [place], [place], [address] Phone: [phone]   Fax:  [phone]  Physical Therapy Treatment  Patient Details  Name: [name] MRN: [id number] Date of Birth: [birth year] No data recorded  Encounter Date: 09/13/2018  PT End of Session - 09/13/18 1119    Visit Number  25    Number of Visits  32    Date for PT Re-Evaluation  10/26/18    Authorization Type   5/10 starting 12/12    PT Start Time  1115    PT Stop Time  1200    PT Time Calculation (min)  45 min    Equipment Utilized During Treatment  Gait belt    Activity Tolerance  Patient tolerated treatment well;Patient limited by fatigue    Behavior During Therapy  WFL for tasks assessed/performed       Past Medical History:  Diagnosis Date  . Arthritis   . Atherosclerotic heart disease of native coronary artery without angina pectoris 1998   - s/p CABG, occluded SVG-D1; patent LIMA-LAD, SVG-OM, SVG- RCA  . Basal cell carcinoma of eyelid 01/03/2013   2019 - R side of Nose  . Benign neoplasm of ascending colon   . Benign neoplasm of descending colon   . Cancer [facility])    Skin cancer  . Colon polyps   . COPD (chronic obstructive pulmonary disease) ([place])   . GERD (gastroesophageal reflux disease)   . Hemorrhoid 02/10/2015  . History of hiatal hernia   . HOH (hard of hearing)   . Hypertension   . Memory loss   . Osteoporosis   . PAF (paroxysmal atrial fibrillation) ([place])   . Prostate disorder   . Sciatic pain    Chronic  . Sleep apnea    cpap  . Temporary cerebral vascular dysfunction 02/10/2015   Had negative work-up.  July, 2012.  No medication changes, had forgot them that day, got dehydrated.     Past Surgical History:  Procedure Laterality Date  . ANKLE SURGERY    . Arch aortogram and carotid aortogram  06/02/2006   Dr Oneida Alar did surgery  . BASAL CELL CARCINOMA EXCISION  04/2016   Dermatology  . CARDIAC CATHETERIZATION   01/30/1998    (Dr. Linard Millers): Native LAD & mRCA CTO. 90% D1. Mod Cx. SVG-D1 CTO. SVG-RCA & SVG-OM along with LIMA-LAD patent;  LV NORMAL Fxn  . CAROTID ENDARTERECTOMY Left Oct. 29, 2007   Dr. Oneida Alar:  . CATARACT EXTRACTION W/PHACO Right 03/05/2017   Procedure: CATARACT EXTRACTION PHACO AND INTRAOCULAR LENS PLACEMENT (IOC);  Surgeon: Leandrew Koyanagi, MD;  Location: [facility] ORS;  Service: Ophthalmology;  Laterality: Right;  [place] 00:58.5 AP% 10.6 CDE 6.20 Fluid Pack lot # [id number]  . CATARACT EXTRACTION W/PHACO Left 04/15/2017   Procedure: CATARACT EXTRACTION PHACO AND INTRAOCULAR LENS PLACEMENT ([place])  Left;  Surgeon: Leandrew Koyanagi, MD;  Location: [place];  Service: Ophthalmology;  Laterality: Left;  . COLONOSCOPY WITH PROPOFOL N/A 09/15/2017   Procedure: COLONOSCOPY WITH PROPOFOL;  Surgeon: Lucilla Lame, MD;  Location: [facility] ENDOSCOPY;  Service: Endoscopy;  Laterality: N/A;  . CORONARY ARTERY BYPASS GRAFT  1998   LIMA-LAD, SVG-OM, SVG-RPDA, SVG-DIAG  . CPET / MET  12/2012   Mild chronotropic incompetence - read 82% of predicted; also reduced effort; peak VO2 15.7 / 75% (did not reach Max effort) -- suggested ischemic response in last 1.5 minutes of exercise. Normal pulmonary function on PFTs but poor response  to  . DOPPLER ECHOCARDIOGRAPHY  11/20/2010   EF =>55%; LV norm mild aortic scelorosis  . ESOPHAGOGASTRODUODENOSCOPY (EGD) WITH PROPOFOL N/A 09/15/2017   Procedure: ESOPHAGOGASTRODUODENOSCOPY (EGD) WITH PROPOFOL;  Surgeon: Lucilla Lame, MD;  Location: [facility] ENDOSCOPY;  Service: Endoscopy;  Laterality: N/A;  . FRACTURE SURGERY Right 03/19/2015   wrist  . HIATAL HERNIA REPAIR    . ILIAC ARTERY STENT  12/15/2005   PTA and direct stenting rgt and lft common iliac arteries  . NM GATED MYOVIEW ([place] HX)  12/2017   Low Risk - no ischemia or infarct.  EF > 65%. no RWMA  . NM MYOCAR PERF WALL MOTION  11/11/2010; February 2016   a) TM STRESS----NORMAL PERFUSION ,EF 68%; b) EF 50-55%.  Normal LV function. No significant ischemia or infarction.  . ORIF ELBOW FRACTURE Right 01/01/2017   Procedure: OPEN REDUCTION INTERNAL FIXATION (ORIF) ELBOW/OLECRANON FRACTURE;  Surgeon: Hessie Knows, MD;  Location: [facility] ORS;  Service: Orthopedics;  Laterality: Right;  . ORIF WRIST FRACTURE Right 03/19/2015   Procedure: OPEN REDUCTION INTERNAL FIXATION (ORIF) WRIST FRACTURE;  Surgeon: Hessie Knows, MD;  Location: [facility] ORS;  Service: Orthopedics;  Laterality: Right;  . THORACIC AORTA - CAROTID ANGIOGRAM  October 2007   Dr. 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 TRANSTHORACIC ECHOCARDIOGRAM  February 2016   [facility]: Normal LV function. Dilated left atrium.    There were no vitals filed for this visit.  Subjective Assessment - 09/13/18 1117    Subjective  Patient reports the exercises given to him for back pain has been helping control it and decrease the frequency of pain. Reports no pain or LOB.    Pertinent History  Patient goes 5x/week for radiation therapy for prostate cancer. Is getting weaker and no longer goes to the senior center. Just started using cane, when walks a lot his low back and legs ache. He has a significant CV history including quadruple bypass, CAD w cardiac stents, on Eliquis, left carotid endarterectomy, AAA. He is functionally independent but lives alone, his partner lives 5 minutes down the road and seems to assist him with daily living. Activity as tolerated per patient report. Has history of memory deficits.     Limitations  Walking;Standing;House hold activities;Other (comment);Lifting    How long can you sit comfortably?  n/a    How long can you stand comfortably?  Reports that he has not used his cane in about 1 month    How long can you walk comfortably?  200-300 yards    Patient Stated Goals  Pt would still like to work on his balance    Currently in Pain?  Yes    Pain Score  3      Pain Location  Back    Pain Orientation  Right;Lower    Pain Descriptors / Indicators  Aching;Nagging    Pain Type  Acute pain    Pain Onset  In the past 7 days    Pain Frequency  Constant       Nustep lvl 5 5 minutes >60rpm for cardiovascular challenge, VCs for maintaining speed.      In // bars   Airex balance beam: side step in 6x length of // bars occasional UE support required. CGA with verbal cueing for foot placement and tactile cueing for upright posture.   Ambulate over unstable red mat in // bars to promote stability when crossing unstable surfaces, CGA ,  slight stagger to L side with fatigue, 6x length of bars.    Airex pad: balloon taps reaching inside and outside BOS 2 minutes, SUE support <20% of the time   Airex: 4lb bar: press 10x, straight arm raise 10x , CGA with occasional episodes of ankle instability resulting in trunk sway.    One foot forward, each on foam into semi tandem stance 3x30s each LE forward with improved stability than previous sessions, decreased episodes of LOB  6" step step ups 12x each LE SUE-BUE  Support (increased UE support upon fatigue), CGA with verbal cueing for upright posture and foot placement   6" step step ups laterally x15 bilaterally BUE support, CGA with verbal cueing for upright posture and positioning of feet.    Seated: hamstring stretch 2x 30 seconds each LE with heel on 6" step, verbal cueing for sequencing and length of hold   4lb ankle weights standing in // bars: -hip extension 10x each LE, verbal and tactile cueing for body position for decreased compensatory patterning -hip abduction each LE, tactile cueing for keeping hips aligned and verbal cues for toes forward   Pt needed several sitting rest breaks during session due to fatigue. Educated on breathing technique.                          PT Education - 09/13/18 1118    Education provided  Yes    Education Details  exercise technique,  stability     Person(s) Educated  Patient    Methods  Explanation;Demonstration;Tactile cues;Verbal cues    Comprehension  Verbalized understanding;Returned demonstration;Tactile cues required;Need further instruction;Verbal cues required       PT Short Term Goals - 08/12/18 1313      PT SHORT TERM GOAL #1   Title  Patient will be independent in home exercise program to improve strength/mobility for better functional independence with ADLs.    Baseline  9/17: HEP given 11/4: compliant at this time    Time  2    Period  Weeks    Status  Partially Met      PT SHORT TERM GOAL #2   Title  Patient will deny any falls over past 2 weeks to demonstrate improved safety awareness at home and work.     Baseline  9/17: progressive weakness 11/4: denies falls last 2 weeks    Time  2    Period  Weeks    Status  Achieved      PT SHORT TERM GOAL #3   Title  Patient will increase BLE gross strength to 4-/5 as to improve functional strength for independent gait, increased standing tolerance and increased ADL ability.    Baseline  9/17: Hip flex: L 3/5 R 3+/5 abd: 2+/5, 3-/5; add 2/5 2+/5 11/4: hip flexion L 3+/5 R 3+/5 abd 2+/5, 2+/5 add 2+/5, 2+/5    Time  2    Period  Weeks    Status  Partially Met        PT Long Term Goals - 08/12/18 1313      PT LONG TERM GOAL #1   Title  Patient will increase BLE gross strength to 4+/5 as to improve functional strength for independent gait, increased standing tolerance and increased ADL ability.    Baseline  9/17: Hip flex: L 3/5 R 3+/5 abd: 2+/5, 3-/5; add 2/5 2+/5 11/4: hip flexion L 3+/5 R 3+/5 abd 2+/5, 2+/5 add 2+/5, 2+/5  Time  8    Period  Weeks    Status  Partially Met      PT LONG TERM GOAL #2   Title  Patient will increase ABC scale score >80% to demonstrate better functional mobility and better confidence with ADLs.     Baseline  9/17: 68.13% 11/4: 76.6%    Time  8    Period  Weeks    Status  Partially Met      PT LONG TERM GOAL #3    Title  Patient will increase Berg Balance score by > 6 points (44/56)  to demonstrate decreased fall risk during functional activities.    Baseline  9/17: 38/56 11/4: 39/56 12/12: 43/56    Time  8    Period  Weeks    Status  Partially Met    Target Date  08/30/18      PT LONG TERM GOAL #4   Title  Patient will increase six minute walk test distance to >1000 for progression to community ambulator and improve gait ability    Baseline  9/18: 1300 ft w/o cane 11/4: 951f  12/12: 840 ft without cane    Time  8    Period  Weeks    Status  On-going    Target Date  08/30/18      PT LONG TERM GOAL #5   Title  Patient (> 8101years old) will complete five times sit to stand test in < 15 seconds without UE support indicating an increased LE strength and improved balance.    Baseline  11/4: requires 1 UE support 12/12:  18 seconds no UE support    Time  8    Period  Weeks    Status  Partially Met            Plan - 09/13/18 1136    Clinical Impression Statement  Patient introduced into ambulation across unstable surface with deterioration of gait mechanics, slight shuffling of feet and small stagger towards left. Static stability on unstable surface is improving with decreased episodes of LOB. Patient will continue to benefit from skilled physical therapy to improve strength, balance, and functional mobility    Rehab Potential  Fair    Clinical Impairments Affecting Rehab Potential  (+) good support system, high prior level of function (-) currently undergoing radiation therapy for prostate cancer, memory deficits     PT Frequency  2x / week    PT Duration  8 weeks    PT Treatment/Interventions  ADLs/Self Care Home Management;Aquatic Therapy;Cryotherapy;Moist Heat;Traction;Ultrasound;Therapeutic activities;Functional mobility training;Stair training;Gait training;DME Instruction;Therapeutic exercise;Balance training;Neuromuscular re-education;Patient/family education;Manual techniques;Passive  range of motion;Energy conservation;Vestibular;Taping    PT Next Visit Plan  strength, balance     PT Home Exercise Plan  see above    Consulted and Agree with Plan of Care  Patient       Patient will benefit from skilled therapeutic intervention in order to improve the following deficits and impairments:  Abnormal gait, Cardiopulmonary status limiting activity, Decreased activity tolerance, Decreased balance, Decreased knowledge of precautions, Decreased endurance, Decreased coordination, Decreased cognition, Decreased knowledge of use of DME, Decreased mobility, Difficulty walking, Decreased safety awareness, Decreased strength, Impaired flexibility, Impaired perceived functional ability, Impaired sensation, Postural dysfunction, Improper body mechanics, Pain  Visit Diagnosis: Unsteadiness on feet  Muscle weakness (generalized)  Other abnormalities of gait and mobility     Problem List Patient Active Problem List   Diagnosis Date Noted  . Prostate cancer ([place] 02/25/2018  .  Prostatic cyst 01/15/2018  . 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 ([place]) 09/15/2017  . Blood in stool   . Abnormal feces   . Stricture and stenosis of esophagus   . Mild cognitive impairment 08/18/2017  . Senile purpura ([place]) 04/29/2017  . Anemia 04/29/2017  . Frequent falls 04/29/2017  . Benign localized hyperplasia of prostate with urinary obstruction 07/15/2016  . Elevated PSA 07/15/2016  . Incomplete emptying of bladder 07/15/2016  . Urge incontinence 07/15/2016  . Hypothyroidism 04/25/2016  . Macular degeneration 04/25/2016  . Erectile dysfunction due to arterial insufficiency   . Ischemic heart disease with chronotropic incompetence   . CN (constipation) 02/10/2015  . DD (diverticular disease) 02/10/2015  . Fatigue 02/10/2015  . Lichen planus 19/94/1290  . Restless leg 02/10/2015  . 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 B12 deficiency 02/10/2015  . COPD (chronic obstructive pulmonary disease)  ([place]) 11/14/2014  . Post Inflammatory Lung Changes 11/14/2014  . PAF (paroxysmal atrial fibrillation) ([facility]) CHA2DS2-VASc = 4. AC = Eliquis   . Insomnia 12/09/2013  . OSA on CPAP   . Dyspnea on exertion - -essentially resolved with BB dose reduction & wgt loss 03/29/2013  . Overweight (BMI 25.0-29.9) -- Wgt back up 01/17/2013  . Atherosclerotic heart disease of native coronary artery without angina pectoris - s/p CABG, occluded SVG-D1; patent LIMA-LAD, SVG-OM, SVG- RCA   . Essential hypertension   . Hyperlipidemia with target LDL less than 70   . Aorto-iliac disease ([place])   . BCC (basal cell carcinoma), eyelid 01/03/2013  . Allergic rhinitis 08/17/2009  . Adaptation reaction 05/28/2009  . Acid reflux 05/28/2009  . Arthritis, degenerative 05/28/2009  . 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 Carotid artery occlusion without infarction 06/01/2006  . PAD (peripheral artery disease) - bilateral common iliac stents 12/15/2005  . Aortoiliac occlusive disease ([place]) 11/30/2005  . Atherosclerotic heart disease of artery bypass graft 09/01/1997   Janna Arch, PT, DPT   09/13/2018, 12:00 PM  [place] [facility]IN [facility] SERVICES [address] [place], [place], [address] Phone: [phone]   Fax:  [phone]  Name: [name] MRN: [id number] Date of Birth: 15-Jan-1934

## 2018-09-14 ENCOUNTER — Encounter: Payer: Self-pay | Admitting: Family Medicine

## 2018-09-14 ENCOUNTER — Ambulatory Visit (INDEPENDENT_AMBULATORY_CARE_PROVIDER_SITE_OTHER): Payer: Medicare Other | Admitting: Family Medicine

## 2018-09-14 VITALS — BP 136/72 | HR 68 | Temp 97.8°F | Resp 16 | Wt 218.0 lb

## 2018-09-14 DIAGNOSIS — Z9989 Dependence on other enabling machines and devices: Secondary | ICD-10-CM | POA: Diagnosis not present

## 2018-09-14 DIAGNOSIS — G3184 Mild cognitive impairment, so stated: Secondary | ICD-10-CM

## 2018-09-14 DIAGNOSIS — R296 Repeated falls: Secondary | ICD-10-CM

## 2018-09-14 DIAGNOSIS — J432 Centrilobular emphysema: Secondary | ICD-10-CM

## 2018-09-14 DIAGNOSIS — N39498 Other specified urinary incontinence: Secondary | ICD-10-CM | POA: Diagnosis not present

## 2018-09-14 DIAGNOSIS — G4733 Obstructive sleep apnea (adult) (pediatric): Secondary | ICD-10-CM | POA: Diagnosis not present

## 2018-09-14 NOTE — Progress Notes (Signed)
Patient: [name] Male    DOB: [birth year]   85 y.o.   MRN: [id number] Visit Date: 09/14/2018  Today's Provider: Lelon Huh, MD   Chief Complaint  Patient presents with  . Form Completion  . Sleep Apnea  . Urinary Incontinence  . Memory Loss   Subjective:     HPI   Patient with multiple chronic medical problems in need of assistance with Activities of Daily Living developing over the last several months. He has recently moved into [facility] and patient and family remembers he is doing much better. He had several falls at home prior to moving to [facility], but is not having physical therapy twice a week and had no falls since.  He has mild COPD and severe sleep apnea and requires assistance keeping CPAP on at night. Prior to moving into assisted living he was episodes of profound confusion and wandering due to taking his CPAP off at night and likely becoming hypoxic. He also requires assistance taking his medication correctly. Prior to moving into assisted living he also had episodes of confusion precipitated by taking his medications incorrectly.  He does requires standby assistance with betting into shower due to unsteadiness and history of falls.  He does have history of prostate cancer and is having problems with urinary incontinence, which is worse at night. He is having difficulty making to the bathroom before losing control of his bladder. He has follow up with urology tomorrow.    Allergies  Allergen Reactions  . Clonazepam Other (See Comments)    Altered mental status  . Codeine Other (See Comments)    Altered mental status.  . Niacin And Related Other (See Comments)    Flushing of skin  . Tramadol     Other reaction(s): Hallucination  . Hydrocodone     confusion  . Oxycodone Other (See Comments)    confusion confusion     Current Outpatient Medications:  .  acetaminophen (TYLENOL) 500 MG tablet, Take 500-1,000 mg by mouth every 6 (six) hours  as needed (for pain.)., Disp: , Rfl:  .  ADVAIR DISKUS 250-50 MCG/DOSE AEPB, INHALE 1 PUFF TWICE A DAY AS DIRECTED **RINSE MOUTH AFTER USE**, Disp: 60 each, Rfl: 5 .  apixaban (ELIQUIS) 5 MG TABS tablet, Take 1 tablet (5 mg total) by mouth 2 (two) times daily., Disp: 180 tablet, Rfl: 2 .  Ascorbic Acid (VITAMIN C) 1000 MG tablet, Take 1,000 mg by mouth daily., Disp: , Rfl:  .  cetirizine (ZYRTEC) 10 MG tablet, Take 10 mg by mouth daily., Disp: , Rfl:  .  Cholecalciferol (VITAMIN D-3 PO), Take 2,000 Units by mouth every evening. , Disp: , Rfl:  .  Cyanocobalamin (VITAMIN B-12) 500 MCG SUBL, Place 500 mcg under the tongue daily. , Disp: , Rfl:  .  DEXILANT 60 MG capsule, TAKE 1 CAPSULE EVERY DAY, Disp: 30 capsule, Rfl: 11 .  donepezil (ARICEPT) 10 MG tablet, Take 10 mg by mouth at bedtime. , Disp: , Rfl:  .  ezetimibe (ZETIA) 10 MG tablet, TAKE 1 TABLET BY MOUTH DAILY, Disp: 30 tablet, Rfl: 5 .  finasteride (PROSCAR) 5 MG tablet, Take 5 mg by mouth daily., Disp: , Rfl:  .  fluticasone (FLONASE) 50 MCG/ACT nasal spray, Place 2 sprays into both nostrils daily. (Patient taking differently: Place 2 sprays into both nostrils daily as needed for allergies. ), Disp: 16 g, Rfl: 6 .  lisinopril (PRINIVIL,ZESTRIL) 2.5 MG tablet, TAKE ONE TABLET  BY MOUTH TWICE DAILY, Disp: 180 tablet, Rfl: 4 .  MAGNESIUM OXIDE PO, Take 500 mg by mouth every evening. , Disp: , Rfl:  .  metoprolol tartrate (LOPRESSOR) 25 MG tablet, TAKE ONE TABLET BY MOUTH TWICE DAILY, Disp: 180 tablet, Rfl: 1 .  mirabegron ER (MYRBETRIQ) 50 MG TB24 tablet, Take 50 mg by mouth at bedtime. , Disp: , Rfl:  .  Multiple Vitamins-Minerals (PRESERVISION AREDS 2 PO), Take 1 tablet by mouth 2 (two) times daily. , Disp: , Rfl:  .  nitroGLYCERIN (NITROSTAT) 0.4 MG SL tablet, Place 1 tablet (0.4 mg total) under the tongue every 5 (five) minutes as needed for chest pain., Disp: 20 tablet, Rfl: 1 .  nortriptyline (PAMELOR) 10 MG capsule, Take 10 mg by mouth  at bedtime. , Disp: , Rfl:  .  rosuvastatin (CRESTOR) 20 MG tablet, TAKE ONE TABLET EVERY DAY, Disp: 90 tablet, Rfl: 4 .  tamsulosin (FLOMAX) 0.4 MG CAPS capsule, Take 0.4 mg by mouth daily., Disp: , Rfl:  .  NON FORMULARY, Swish and spit 1 Dose at bedtime. Nystatin elixir month wash , Disp: , Rfl:  .  nystatin (MYCOSTATIN) 100000 UNIT/ML suspension, Use as directed 5 mLs in the mouth or throat daily as needed (irritation). , Disp: , Rfl:   Patient Active Problem List   Diagnosis Date Noted  . Prostate cancer ([place]) 02/25/2018  . Prostatic cyst 01/15/2018  . 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 ([place]) 09/15/2017  . Blood in stool   . Abnormal feces   . Stricture and stenosis of esophagus   . Mild cognitive impairment 08/18/2017  . Senile purpura ([place]) 04/29/2017  . Anemia 04/29/2017  . Frequent falls 04/29/2017  . Benign localized hyperplasia of prostate with urinary obstruction 07/15/2016  . Elevated PSA 07/15/2016  . Incomplete emptying of bladder 07/15/2016  . Urge incontinence 07/15/2016  . Hypothyroidism 04/25/2016  . Macular degeneration 04/25/2016  . Erectile dysfunction due to arterial insufficiency   . Ischemic heart disease with chronotropic incompetence   . CN (constipation) 02/10/2015  . DD (diverticular disease) 02/10/2015  . Fatigue 02/10/2015  . Lichen planus 54/98/2641  . Restless leg 02/10/2015  . 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 B12 deficiency 02/10/2015  . COPD (chronic obstructive pulmonary disease) ([place]) 11/14/2014  . Post Inflammatory Lung Changes 11/14/2014  . PAF (paroxysmal atrial fibrillation) (HCC) CHA2DS2-VASc = 4. AC = Eliquis   . Insomnia 12/09/2013  . OSA on CPAP   . Dyspnea on exertion - -essentially resolved with BB dose reduction & wgt loss 03/29/2013  . Overweight (BMI 25.0-29.9) -- Wgt back up 01/17/2013  . Atherosclerotic heart disease of native coronary artery without angina pectoris - s/p CABG, occluded SVG-D1; patent LIMA-LAD, SVG-OM,  SVG- RCA   . Essential hypertension   . Hyperlipidemia with target LDL less than 70   . Aorto-iliac disease ([place])   . BCC (basal cell carcinoma), eyelid 01/03/2013  . Allergic rhinitis 08/17/2009  . Adaptation reaction 05/28/2009  . Acid reflux 05/28/2009  . Arthritis, degenerative 05/28/2009  . 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 Carotid artery occlusion without infarction 06/01/2006  . PAD (peripheral artery disease) - bilateral common iliac stents 12/15/2005  . Aortoiliac occlusive disease ([place]) 11/30/2005  . Atherosclerotic heart disease of artery bypass graft 09/01/1997   Past Medical History:  Diagnosis Date  . Arthritis   . Atherosclerotic heart disease of native coronary artery without angina pectoris 1998   - s/p CABG, occluded SVG-D1; patent LIMA-LAD, SVG-OM,  SVG- RCA  . Basal cell carcinoma of eyelid 01/03/2013   2019 - R side of Nose  . Benign neoplasm of ascending colon   . Benign neoplasm of descending colon   . Cancer [facility])    Skin cancer  . Colon polyps   . COPD (chronic obstructive pulmonary disease) ([place])   . GERD (gastroesophageal reflux disease)   . Hemorrhoid 02/10/2015  . History of hiatal hernia   . HOH (hard of hearing)   . Hypertension   . Memory loss   . Osteoporosis   . PAF (paroxysmal atrial fibrillation) ([place])   . Prostate disorder   . Sciatic pain    Chronic  . Sleep apnea    cpap  . Temporary cerebral vascular dysfunction 02/10/2015   Had negative work-up.  July, 2012.  No medication changes, had forgot them that day, got dehydrated.    Past Surgical History:  Procedure Laterality Date  . ANKLE SURGERY    . Arch aortogram and carotid aortogram  06/02/2006   Dr Oneida Alar did surgery  . BASAL CELL CARCINOMA EXCISION  04/2016   Dermatology  . CARDIAC CATHETERIZATION  01/30/1998    (Dr. Linard Millers): Native LAD & mRCA CTO. 90% D1. Mod Cx. SVG-D1 CTO. SVG-RCA & SVG-OM along with LIMA-LAD patent;  LV NORMAL Fxn  . CAROTID  ENDARTERECTOMY Left Oct. 29, 2007   Dr. Oneida Alar:  . CATARACT EXTRACTION W/PHACO Right 03/05/2017   Procedure: CATARACT EXTRACTION PHACO AND INTRAOCULAR LENS PLACEMENT (IOC);  Surgeon: Leandrew Koyanagi, MD;  Location: [facility] ORS;  Service: Ophthalmology;  Laterality: Right;  [place] 00:58.5 AP% 10.6 CDE 6.20 Fluid Pack lot # [id number]  . CATARACT EXTRACTION W/PHACO Left 04/15/2017   Procedure: CATARACT EXTRACTION PHACO AND INTRAOCULAR LENS PLACEMENT ([place])  Left;  Surgeon: Leandrew Koyanagi, MD;  Location: [facility];  Service: Ophthalmology;  Laterality: Left;  . COLONOSCOPY WITH PROPOFOL N/A 09/15/2017   Procedure: COLONOSCOPY WITH PROPOFOL;  Surgeon: Lucilla Lame, MD;  Location: [facility] ENDOSCOPY;  Service: Endoscopy;  Laterality: N/A;  . CORONARY ARTERY BYPASS GRAFT  1998   LIMA-LAD, SVG-OM, SVG-RPDA, SVG-DIAG  . CPET / MET  12/2012   Mild chronotropic incompetence - read 82% of predicted; also reduced effort; peak VO2 15.7 / 75% (did not reach Max effort) -- suggested ischemic response in last 1.5 minutes of exercise. Normal pulmonary function on PFTs but poor response to  . DOPPLER ECHOCARDIOGRAPHY  11/20/2010   EF =>55%; LV norm mild aortic scelorosis  . ESOPHAGOGASTRODUODENOSCOPY (EGD) WITH PROPOFOL N/A 09/15/2017   Procedure: ESOPHAGOGASTRODUODENOSCOPY (EGD) WITH PROPOFOL;  Surgeon: Lucilla Lame, MD;  Location: [facility] ENDOSCOPY;  Service: Endoscopy;  Laterality: N/A;  . FRACTURE SURGERY Right 03/19/2015   wrist  . HIATAL HERNIA REPAIR    . ILIAC ARTERY STENT  12/15/2005   PTA and direct stenting rgt and lft common iliac arteries  . NM GATED MYOVIEW ([place] [facility])  12/2017   Low Risk - no ischemia or infarct.  EF > 65%. no RWMA  . NM MYOCAR PERF WALL MOTION  11/11/2010; February 2016   a) TM STRESS----NORMAL PERFUSION ,EF 68%; b) EF 50-55%. Normal LV function. No significant ischemia or infarction.  . ORIF ELBOW FRACTURE Right 01/01/2017   Procedure: OPEN REDUCTION INTERNAL FIXATION (ORIF)  ELBOW/OLECRANON FRACTURE;  Surgeon: Hessie Knows, MD;  Location: [facility] ORS;  Service: Orthopedics;  Laterality: Right;  . ORIF WRIST FRACTURE Right 03/19/2015   Procedure: OPEN REDUCTION INTERNAL FIXATION (ORIF) WRIST FRACTURE;  Surgeon: Hessie Knows, MD;  Location: [facility] ORS;  Service: Orthopedics;  Laterality: Right;  . THORACIC AORTA - CAROTID ANGIOGRAM  October 2007   Dr. 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 TRANSTHORACIC ECHOCARDIOGRAM  February 2016   [facility]: Normal LV function. Dilated left atrium.   Patient Care Team    Relationship Specialty Notifications Start End  [name], MD PCP - General Family Medicine  03/19/16   Leonie Man, MD Consulting Physician Cardiology  10/19/14   Dasher, Rayvon Char, MD  Dermatology  10/19/14   Nickel, Sharmon Leyden, NP Nurse Practitioner Vascular Surgery  04/25/16   Laverle Hobby, MD Consulting Physician Pulmonary Disease  10/23/16   Hessie Knows, MD Consulting Physician Orthopedic Surgery  12/23/16   Leandrew Koyanagi, MD Referring Physician Ophthalmology  04/29/17   Abbie Sons, MD  Urology  06/22/18     Review of Systems  Constitutional: Negative.   Respiratory: Positive for apnea. Negative for cough, choking, chest tightness, shortness of breath, wheezing and stridor.   Cardiovascular: Negative.   Gastrointestinal: Negative.   Neurological: Negative for dizziness, light-headedness and headaches.    Social History   Tobacco Use  . Smoking status: Former Smoker    Types: Cigarettes    Last attempt to quit: 08/04/1997    Years since quitting: 21.1  . Smokeless tobacco: Never Used  Substance Use Topics  . Alcohol use: No      Objective:   BP 136/72 (BP Location: Right Arm, Patient Position: Sitting, Cuff Size: Large)   Pulse 68   Temp 97.8 F (36.6 C) (Oral)   Resp 16   Wt 218 lb (98.9 kg)   BMI 29.57 kg/m  Vitals:    09/14/18 1017  BP: 136/72  Pulse: 68  Resp: 16  Temp: 97.8 F (36.6 C)  TempSrc: Oral  Weight: 218 lb (98.9 kg)     Physical Exam   General Appearance:    Alert, cooperative, no distress  Eyes:    PERRL, conjunctiva/corneas clear, EOM's intact       Lungs:     Clear to auscultation bilaterally, respirations unlabored  Heart:    Regular rate and rhythm  Neurologic:   Awake, alert, oriented x 3. No apparent focal neurological           defect. Negative Rhomberg. Normal finger to nose test of cerebellar function. Normal walking gait.       MMSE - Mini Mental State Exam 09/14/2018  Orientation to time 5  Orientation to Place 5  Registration 3  Attention/ Calculation 4  Recall 2  Language- name 2 objects 2  Language- repeat 1  Language- follow 3 step command 3  Language- read & follow direction 1  Write a sentence 1  Copy design 1  Total score 28       Assessment & Plan    1. Centrilobular emphysema (HCC) Stable on current regiment. Is imperative that patient use CPAP consistently due to cognitive declines from hypoxia when he does not. Patient requires nightly supervision to ensure CPAP is used throughout the night necessitating placement in assisted living facility.    2. OSA on CPAP As above.   3. Mild cognitive impairment Stable on current medications and CPAP as above. Continue routine follow up with neurology.   4. Falls frequently Much improved since moving to assisted living facility and regular PT  5. Other urinary incontinence Requires some assistance at place of  residence. Is to follow up with urology tomorrow as scheduled.      Lelon Huh, MD  [facility]

## 2018-09-14 NOTE — Patient Instructions (Signed)
.   Please bring all of your medications to every appointment so we can make sure that our medication list is the same as yours.   

## 2018-09-15 ENCOUNTER — Ambulatory Visit (INDEPENDENT_AMBULATORY_CARE_PROVIDER_SITE_OTHER): Payer: Medicare Other | Admitting: Urology

## 2018-09-15 ENCOUNTER — Ambulatory Visit: Payer: Medicare Other | Admitting: Cardiology

## 2018-09-15 ENCOUNTER — Telehealth: Payer: Self-pay | Admitting: Urology

## 2018-09-15 ENCOUNTER — Encounter: Payer: Self-pay | Admitting: Urology

## 2018-09-15 ENCOUNTER — Telehealth: Payer: Self-pay

## 2018-09-15 VITALS — BP 137/86 | HR 84 | Ht 72.0 in | Wt 216.0 lb

## 2018-09-15 DIAGNOSIS — C61 Malignant neoplasm of prostate: Secondary | ICD-10-CM | POA: Diagnosis not present

## 2018-09-15 DIAGNOSIS — R310 Gross hematuria: Secondary | ICD-10-CM | POA: Diagnosis not present

## 2018-09-15 LAB — MICROSCOPIC EXAMINATION
Epithelial Cells (non renal): NONE SEEN /hpf (ref 0–10)
RBC, UA: >30 /hpf — ABNORMAL HIGH (ref 0–2)
WBC, UA: NONE SEEN /hpf (ref 0–5)

## 2018-09-15 LAB — URINALYSIS, COMPLETE
Bilirubin, UA: NEGATIVE
Glucose, UA: NEGATIVE
Ketones, UA: NEGATIVE
Leukocytes, UA: NEGATIVE
Nitrite, UA: NEGATIVE
Specific Gravity, UA: >1.03 — ABNORMAL HIGH (ref 1.005–1.030)
Urobilinogen, Ur: 0.2 mg/dL (ref 0.2–1.0)
pH, UA: 5.5 (ref 5.0–7.5)

## 2018-09-15 MED ORDER — MIRABEGRON ER 25 MG PO TB24: 25.0000 mg | ORAL_TABLET | Freq: Every day | ORAL | 3 refills | Status: DC

## 2018-09-15 MED ORDER — MIRABEGRON ER 50 MG PO TB24: 50.0000 mg | ORAL_TABLET | Freq: Every day | ORAL | 0 refills | Status: DC

## 2018-09-15 NOTE — Telephone Encounter (Signed)
Called and cancelled patients myrbetriq 25 because patient is currently taking myrbetriq 50.  Informed patients son.

## 2018-09-15 NOTE — Progress Notes (Addendum)
09/15/2018 9:59 AM   [name] November 13, 1933 [id number]  Referring provider: [name], [address] [place], [place] [address]  Chief Complaint  Patient presents with  . Prostate Cancer  . Gross Hematuria   Urologic history: 1.  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  After completion of his radiation he does complain of urinary frequency, urgency with urge incontinence.  He is also had intermittent gross hematuria.  Denies dysuria, fever, chills.   PMH: Past Medical History:  Diagnosis Date  . Arthritis   . Atherosclerotic heart disease of native coronary artery without angina pectoris 1998   - s/p CABG, occluded SVG-D1; patent LIMA-LAD, SVG-OM, SVG- RCA  . Basal cell carcinoma of eyelid 01/03/2013   2019 - R side of Nose  . Benign neoplasm of ascending colon   . Benign neoplasm of descending colon   . Cancer [facility])    Skin cancer  . Colon polyps   . COPD (chronic obstructive pulmonary disease) ([place])   . GERD (gastroesophageal reflux disease)   . Hemorrhoid 02/10/2015  . History of hiatal hernia   . HOH (hard of hearing)   . Hypertension   . Memory loss   . Osteoporosis   . PAF (paroxysmal atrial fibrillation) ([place])   . Prostate disorder   . Sciatic pain    Chronic  . Sleep apnea    cpap  . Temporary cerebral vascular dysfunction 02/10/2015   Had negative work-up.  July, 2012.  No medication changes, had forgot them that day, got dehydrated.     Surgical History: Past Surgical History:  Procedure Laterality Date  . ANKLE SURGERY    . Arch aortogram and carotid aortogram  06/02/2006   Dr Oneida Alar did surgery  . BASAL CELL CARCINOMA EXCISION  04/2016   Dermatology  . CARDIAC CATHETERIZATION  01/30/1998    (Dr. Linard Millers): Native LAD & mRCA CTO. 90% D1. Mod Cx. SVG-D1 CTO. SVG-RCA & SVG-OM along with LIMA-LAD patent;  LV NORMAL Fxn  . CAROTID ENDARTERECTOMY Left Oct. 29, 2007   Dr. Oneida Alar:  . CATARACT EXTRACTION W/PHACO Right 03/05/2017   Procedure: CATARACT EXTRACTION PHACO AND INTRAOCULAR LENS PLACEMENT (IOC);  Surgeon: Leandrew Koyanagi, MD;  Location: [facility] ORS;  Service: Ophthalmology;  Laterality: Right;  [place] 00:58.5 AP% 10.6 CDE 6.20 Fluid Pack lot # [id number]  . CATARACT EXTRACTION W/PHACO Left 04/15/2017   Procedure: CATARACT EXTRACTION PHACO AND INTRAOCULAR LENS PLACEMENT ([place])  Left;  Surgeon: Leandrew Koyanagi, MD;  Location: [facility];  Service: Ophthalmology;  Laterality: Left;  . COLONOSCOPY WITH PROPOFOL N/A 09/15/2017   Procedure: COLONOSCOPY WITH PROPOFOL;  Surgeon: Lucilla Lame, MD;  Location: [facility] ENDOSCOPY;  Service: Endoscopy;  Laterality: N/A;  . CORONARY ARTERY BYPASS GRAFT  1998   LIMA-LAD, SVG-OM, SVG-RPDA, SVG-DIAG  . CPET / MET  12/2012   Mild chronotropic incompetence - read 82% of predicted; also reduced effort; peak VO2 15.7 / 75% (did not reach Max effort) -- suggested ischemic response in last 1.5 minutes of exercise. Normal pulmonary function on PFTs but poor response to  . DOPPLER ECHOCARDIOGRAPHY  11/20/2010   EF =>55%; LV norm mild aortic scelorosis  . ESOPHAGOGASTRODUODENOSCOPY (EGD) WITH PROPOFOL N/A 09/15/2017   Procedure: ESOPHAGOGASTRODUODENOSCOPY (EGD) WITH PROPOFOL;  Surgeon: Lucilla Lame,  MD;  Location: [facility] ENDOSCOPY;  Service: Endoscopy;  Laterality: N/A;  . FRACTURE SURGERY Right 03/19/2015   wrist  . HIATAL HERNIA REPAIR    . ILIAC ARTERY STENT  12/15/2005   PTA and direct stenting rgt and lft common iliac arteries  . NM GATED MYOVIEW ([place] HX)  12/2017   Low Risk - no ischemia or infarct.  EF > 65%. no RWMA  . NM MYOCAR PERF WALL MOTION  11/11/2010; February 2016   a) TM STRESS----NORMAL PERFUSION ,EF 68%; b) EF 50-55%. Normal LV function. No  significant ischemia or infarction.  . ORIF ELBOW FRACTURE Right 01/01/2017   Procedure: OPEN REDUCTION INTERNAL FIXATION (ORIF) ELBOW/OLECRANON FRACTURE;  Surgeon: Hessie Knows, MD;  Location: [facility] ORS;  Service: Orthopedics;  Laterality: Right;  . ORIF WRIST FRACTURE Right 03/19/2015   Procedure: OPEN REDUCTION INTERNAL FIXATION (ORIF) WRIST FRACTURE;  Surgeon: Hessie Knows, MD;  Location: [facility] ORS;  Service: Orthopedics;  Laterality: Right;  . THORACIC AORTA - CAROTID ANGIOGRAM  October 2007   Dr. 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 TRANSTHORACIC ECHOCARDIOGRAM  February 2016   [facility]: Normal LV function. Dilated left atrium.    Home Medications:  Allergies as of 09/15/2018      Reactions   Clonazepam Other (See Comments)   Altered mental status   Codeine Other (See Comments)   Altered mental status.   Niacin And Related Other (See Comments)   Flushing of skin   Tramadol    Other reaction(s): Hallucination   Hydrocodone    confusion   Oxycodone Other (See Comments)   confusion confusion      Medication List       Accurate as of September 15, 2018  9:59 AM. Always use your most recent med list.        acetaminophen 500 MG tablet Commonly known as:  TYLENOL Take 500-1,000 mg by mouth every 6 (six) hours as needed (for pain.).   ADVAIR DISKUS 250-50 MCG/DOSE Aepb Generic drug:  Fluticasone-Salmeterol INHALE 1 PUFF TWICE A DAY AS DIRECTED **RINSE MOUTH AFTER USE**   apixaban 5 MG Tabs tablet Commonly known as:  ELIQUIS Take 1 tablet (5 mg total) by mouth 2 (two) times daily.   cetirizine 10 MG tablet Commonly known as:  ZYRTEC Take 10 mg by mouth daily.   DEXILANT 60 MG capsule Generic drug:  dexlansoprazole TAKE 1 CAPSULE EVERY DAY   donepezil 10 MG tablet Commonly known as:  ARICEPT Take 10 mg by mouth at bedtime.   ezetimibe 10 MG tablet Commonly known as:  ZETIA TAKE  1 TABLET BY MOUTH DAILY   finasteride 5 MG tablet Commonly known as:  PROSCAR Take 5 mg by mouth daily.   fluticasone 50 MCG/ACT nasal spray Commonly known as:  FLONASE Place 2 sprays into both nostrils daily.   lisinopril 2.5 MG tablet Commonly known as:  PRINIVIL,ZESTRIL TAKE ONE TABLET BY MOUTH TWICE DAILY   MAGNESIUM OXIDE PO Take 500 mg by mouth every evening.   metoprolol tartrate 25 MG tablet Commonly known as:  LOPRESSOR TAKE ONE TABLET BY MOUTH TWICE DAILY   nitroGLYCERIN 0.4 MG SL tablet Commonly known as:  NITROSTAT Place 1 tablet (0.4 mg total) under the tongue every 5 (five) minutes as needed for chest pain.   NON FORMULARY Swish and spit 1 Dose at bedtime. Nystatin elixir month wash   nortriptyline 10 MG capsule Commonly known as:  PAMELOR Take 10 mg by mouth at bedtime.   nystatin 100000 UNIT/ML suspension Commonly known as:  MYCOSTATIN Use as directed 5 mLs in the mouth or throat daily as needed (irritation).   PRESERVISION AREDS 2 PO Take 1 tablet by mouth 2 (two) times daily.   rosuvastatin 20 MG tablet Commonly known as:  CRESTOR TAKE ONE TABLET EVERY DAY   tamsulosin 0.4 MG Caps capsule Commonly known as:  FLOMAX Take 0.4 mg by mouth daily.   Vitamin B-12 500 MCG Subl Place 500 mcg under the tongue daily.   vitamin C 1000 MG tablet Take 1,000 mg by mouth daily.   VITAMIN D-3 PO Take 2,000 Units by mouth every evening.       Allergies:  Allergies  Allergen Reactions  . Clonazepam Other (See Comments)    Altered mental status  . Codeine Other (See Comments)    Altered mental status.  . Niacin And Related Other (See Comments)    Flushing of skin  . Tramadol     Other reaction(s): Hallucination  . Hydrocodone     confusion  . Oxycodone Other (See Comments)    confusion confusion    Family History: Family History  Problem Relation Age of Onset  . Ulcers Mother        Peptic  . Dementia Mother   . Alcohol abuse Father       Social History:  reports that he quit smoking about 21 years ago. His smoking use included cigarettes. He has never used smokeless tobacco. He reports that he does not drink alcohol or use drugs.  ROS: UROLOGY Frequent Urination?: Yes Hard to postpone urination?: No Burning/pain with urination?: No Get up at night to urinate?: Yes Leakage of urine?: Yes Urine stream starts and stops?: No Trouble starting stream?: No Do you have to strain to urinate?: No Blood in urine?: Yes Urinary tract infection?: No Sexually transmitted disease?: No Injury to kidneys or bladder?: No Painful intercourse?: No Weak stream?: No Erection problems?: No Penile pain?: No  Gastrointestinal Nausea?: No Vomiting?: No Indigestion/heartburn?: No Diarrhea?: No Constipation?: No  Constitutional Fever: No Night sweats?: No Weight loss?: No Fatigue?: No  Skin Skin rash/lesions?: No Itching?: No  Eyes Blurred vision?: No Double vision?: No  Ears/Nose/Throat Sore throat?: No Sinus problems?: No  Hematologic/Lymphatic Swollen glands?: No Easy bruising?: No  Cardiovascular Leg swelling?: No Chest pain?: No  Respiratory Cough?: No Shortness of breath?: No  Endocrine Excessive thirst?: No  Musculoskeletal Back pain?: No Joint pain?: No  Neurological Headaches?: No Dizziness?: No  Psychologic Depression?: No Anxiety?: No  Physical Exam: BP 137/86 (BP Location: Left Arm, Patient Position: Sitting)   Pulse 84   Ht 6' (1.829 m)   Wt 216 lb (98 kg)   BMI 29.29 kg/m   Constitutional:  Alert and oriented, No acute distress. HEENT: [place] AT, moist mucus membranes.  Trachea midline, no masses. Cardiovascular: No clubbing, cyanosis, or edema. Respiratory: Normal respiratory effort, no increased work of breathing. GI: Abdomen is soft, nontender, nondistended, no abdominal masses GU: No CVA tenderness Lymph: No cervical or inguinal lymphadenopathy. Skin: No rashes, bruises or  suspicious lesions. Neurologic: Grossly intact, no focal deficits, moving all 4 extremities. Psychiatric: Normal mood and affect.  Laboratory Data:  Urinalysis Microscopy >30 RBC  Assessment & Plan:   85 year old male with high risk prostate cancer status post IMRT.  He has developed storage related voiding symptoms post procedure and intermittent hematuria.  Urine today was sent for  culture.  Myrbetriq is listed in his medication list however he states he is not taking this medication.  He was given samples 25 mg and Rx will be sent to his pharmacy.  He has a follow-up later this month with radiation oncology for a PSA/visit.  I do not see in our records where he was given a Lupron injection in our office.  Will check with the [place]  Follow-up 3 months  The patient's son called back stating he has been taking Myrbetriq 50 mg.  He has had no improvement in his symptoms on this medication.  Would avoid anticholinergic medication in this age group.  Would recommend he discontinue the Myrbetriq.  They had inquired about a condom catheter and will make an appointment for fitting.  Abbie Sons, [place] [address] [place], [place] [address] [phone]

## 2018-09-15 NOTE — Telephone Encounter (Signed)
Crystal from [facility] called asking about the cognitive History form and the ADL form to see if they are completed.  Please return call at [phone].  dbs

## 2018-09-15 NOTE — Telephone Encounter (Signed)
Pt's son wants to make sure that the rx for Myrbetriq 25 mg (is cancelled) pt is already on 50mg 

## 2018-09-16 ENCOUNTER — Ambulatory Visit: Payer: Medicare Other

## 2018-09-16 DIAGNOSIS — M6281 Muscle weakness (generalized): Secondary | ICD-10-CM | POA: Diagnosis not present

## 2018-09-16 DIAGNOSIS — R262 Difficulty in walking, not elsewhere classified: Secondary | ICD-10-CM | POA: Diagnosis not present

## 2018-09-16 DIAGNOSIS — R2689 Other abnormalities of gait and mobility: Secondary | ICD-10-CM | POA: Diagnosis not present

## 2018-09-16 DIAGNOSIS — R2681 Unsteadiness on feet: Secondary | ICD-10-CM | POA: Diagnosis not present

## 2018-09-16 NOTE — Therapy (Signed)
[place] MAIN [facility] SERVICES [address] [place], [place], [address] Phone: [phone]   Fax:  [phone]  Physical Therapy Treatment  Patient Details  Name: [name] MRN: [id number] Date of Birth: Feb 04, 1934 No data recorded  Encounter Date: 09/16/2018  PT End of Session - 09/16/18 1122    Visit Number  26    Number of Visits  32    Date for PT Re-Evaluation  10/26/18    Authorization Type   6/10 starting 12/12    PT Start Time  1116    PT Stop Time  1200    PT Time Calculation (min)  44 min    Equipment Utilized During Treatment  Gait belt    Activity Tolerance  Patient tolerated treatment well;Patient limited by fatigue    Behavior During Therapy  WFL for tasks assessed/performed       Past Medical History:  Diagnosis Date  . Arthritis   . Atherosclerotic heart disease of native coronary artery without angina pectoris 1998   - s/p CABG, occluded SVG-D1; patent LIMA-LAD, SVG-OM, SVG- RCA  . Basal cell carcinoma of eyelid 01/03/2013   2019 - R side of Nose  . Benign neoplasm of ascending colon   . Benign neoplasm of descending colon   . Cancer [facility])    Skin cancer  . Colon polyps   . COPD (chronic obstructive pulmonary disease) ([place])   . GERD (gastroesophageal reflux disease)   . Hemorrhoid 02/10/2015  . History of hiatal hernia   . HOH (hard of hearing)   . Hypertension   . Memory loss   . Osteoporosis   . PAF (paroxysmal atrial fibrillation) ([place])   . Prostate disorder   . Sciatic pain    Chronic  . Sleep apnea    cpap  . Temporary cerebral vascular dysfunction 02/10/2015   Had negative work-up.  July, 2012.  No medication changes, had forgot them that day, got dehydrated.     Past Surgical History:  Procedure Laterality Date  . ANKLE SURGERY    . Arch aortogram and carotid aortogram  06/02/2006   Dr Oneida Alar did surgery  . BASAL CELL CARCINOMA EXCISION  04/2016   Dermatology  . CARDIAC CATHETERIZATION   01/30/1998    (Dr. Linard Millers): Native LAD & mRCA CTO. 90% D1. Mod Cx. SVG-D1 CTO. SVG-RCA & SVG-OM along with LIMA-LAD patent;  LV NORMAL Fxn  . CAROTID ENDARTERECTOMY Left Oct. 29, 2007   Dr. Oneida Alar:  . CATARACT EXTRACTION W/PHACO Right 03/05/2017   Procedure: CATARACT EXTRACTION PHACO AND INTRAOCULAR LENS PLACEMENT (IOC);  Surgeon: Leandrew Koyanagi, MD;  Location: [facility] ORS;  Service: Ophthalmology;  Laterality: Right;  [place] 00:58.5 AP% 10.6 CDE 6.20 Fluid Pack lot # [id number]  . CATARACT EXTRACTION W/PHACO Left 04/15/2017   Procedure: CATARACT EXTRACTION PHACO AND INTRAOCULAR LENS PLACEMENT ([place])  Left;  Surgeon: Leandrew Koyanagi, MD;  Location: [place];  Service: Ophthalmology;  Laterality: Left;  . COLONOSCOPY WITH PROPOFOL N/A 09/15/2017   Procedure: COLONOSCOPY WITH PROPOFOL;  Surgeon: Lucilla Lame, MD;  Location: [facility] ENDOSCOPY;  Service: Endoscopy;  Laterality: N/A;  . CORONARY ARTERY BYPASS GRAFT  1998   LIMA-LAD, SVG-OM, SVG-RPDA, SVG-DIAG  . CPET / MET  12/2012   Mild chronotropic incompetence - read 82% of predicted; also reduced effort; peak VO2 15.7 / 75% (did not reach Max effort) -- suggested ischemic response in last 1.5 minutes of exercise. Normal pulmonary function on PFTs but poor response  to  . DOPPLER ECHOCARDIOGRAPHY  11/20/2010   EF =>55%; LV norm mild aortic scelorosis  . ESOPHAGOGASTRODUODENOSCOPY (EGD) WITH PROPOFOL N/A 09/15/2017   Procedure: ESOPHAGOGASTRODUODENOSCOPY (EGD) WITH PROPOFOL;  Surgeon: Lucilla Lame, MD;  Location: [facility] ENDOSCOPY;  Service: Endoscopy;  Laterality: N/A;  . FRACTURE SURGERY Right 03/19/2015   wrist  . HIATAL HERNIA REPAIR    . ILIAC ARTERY STENT  12/15/2005   PTA and direct stenting rgt and lft common iliac arteries  . NM GATED MYOVIEW ([place] HX)  12/2017   Low Risk - no ischemia or infarct.  EF > 65%. no RWMA  . NM MYOCAR PERF WALL MOTION  11/11/2010; February 2016   a) TM STRESS----NORMAL PERFUSION ,EF 68%; b) EF 50-55%.  Normal LV function. No significant ischemia or infarction.  . ORIF ELBOW FRACTURE Right 01/01/2017   Procedure: OPEN REDUCTION INTERNAL FIXATION (ORIF) ELBOW/OLECRANON FRACTURE;  Surgeon: Hessie Knows, MD;  Location: [facility] ORS;  Service: Orthopedics;  Laterality: Right;  . ORIF WRIST FRACTURE Right 03/19/2015   Procedure: OPEN REDUCTION INTERNAL FIXATION (ORIF) WRIST FRACTURE;  Surgeon: Hessie Knows, MD;  Location: [facility] ORS;  Service: Orthopedics;  Laterality: Right;  . THORACIC AORTA - CAROTID ANGIOGRAM  October 2007   Dr. 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 TRANSTHORACIC ECHOCARDIOGRAM  February 2016   [facility]: Normal LV function. Dilated left atrium.    There were no vitals filed for this visit.  Subjective Assessment - 09/16/18 1120    Subjective  Patient reports his middle toe on the left foot is really painful after he "banged it on a table" two days ago. Hurts more in standing than sitting. Reports compliance with some of his HEP but not all.  Reports no falls since last session.     Pertinent History  Patient goes 5x/week for radiation therapy for prostate cancer. Is getting weaker and no longer goes to the senior center. Just started using cane, when walks a lot his low back and legs ache. He has a significant CV history including quadruple bypass, CAD w cardiac stents, on Eliquis, left carotid endarterectomy, AAA. He is functionally independent but lives alone, his partner lives 5 minutes down the road and seems to assist him with daily living. Activity as tolerated per patient report. Has history of memory deficits.     Limitations  Walking;Standing;House hold activities;Other (comment);Lifting    How long can you sit comfortably?  n/a    How long can you stand comfortably?  Reports that he has not used his cane in about 1 month    How long can you walk comfortably?  200-300 yards    Patient Stated  Goals  Pt would still like to work on his balance    Currently in Pain?  Yes    Pain Score  8     Pain Location  Toe (Comment which one)    Pain Orientation  Left   middle   Pain Descriptors / Indicators  Tender    Pain Type  Acute pain    Pain Onset  In the past 7 days    Pain Frequency  Constant    Aggravating Factors   walking on it, weight bearing    Pain Relieving Factors  nothing       Nustep lvl 5 5 minutes >60rpm for cardiovascular challenge, VCs for maintaining speed.      Supine: interventions performed with verbal and tactile  cueing for body mechanics and muscle activation for strengthening and coordination of muscle contraction.   Bridges 10x, arms crossed, limited clearance of table support to LEs for stability  SLR with opp knee bent; 10x each LE tactile cue for height of leg lift, verbal cueing for keeping leg extended (preference for knee flexion)   Hooklying abduction GTB 20x, verbal cueing for slowing velocity down for maximal muscle recruitment  Hooklying adduction ball squeezes 20 x 5 second holds, verbal cueing for length of contraction   TrA activation with swiss ball pressing legs and hands into ball for muscle activation 10x 3 second holds.   Swiss ball hamstring curls 10x with support for stabilization due to challenge to keep feet on ball  #4 ankle weights:  Short arc quad 15x each LE, 3 second holds,   Marching in hooklying 10x each LE with TrA activation    Seated: #4 ankle weights: 10x each LE LAQ with tactile cueing for arc of movement                       PT Education - 09/16/18 1122    Education provided  Yes    Education Details  exercise technique, muscle recruitment    Person(s) Educated  Patient    Methods  Explanation;Demonstration;Tactile cues;Verbal cues    Comprehension  Verbalized understanding;Returned demonstration;Tactile cues required;Need further instruction;Verbal cues required       PT Short Term  Goals - 08/12/18 1313      PT SHORT TERM GOAL #1   Title  Patient will be independent in home exercise program to improve strength/mobility for better functional independence with ADLs.    Baseline  9/17: HEP given 11/4: compliant at this time    Time  2    Period  Weeks    Status  Partially Met      PT SHORT TERM GOAL #2   Title  Patient will deny any falls over past 2 weeks to demonstrate improved safety awareness at home and work.     Baseline  9/17: progressive weakness 11/4: denies falls last 2 weeks    Time  2    Period  Weeks    Status  Achieved      PT SHORT TERM GOAL #3   Title  Patient will increase BLE gross strength to 4-/5 as to improve functional strength for independent gait, increased standing tolerance and increased ADL ability.    Baseline  9/17: Hip flex: L 3/5 R 3+/5 abd: 2+/5, 3-/5; add 2/5 2+/5 11/4: hip flexion L 3+/5 R 3+/5 abd 2+/5, 2+/5 add 2+/5, 2+/5    Time  2    Period  Weeks    Status  Partially Met        PT Long Term Goals - 08/12/18 1313      PT LONG TERM GOAL #1   Title  Patient will increase BLE gross strength to 4+/5 as to improve functional strength for independent gait, increased standing tolerance and increased ADL ability.    Baseline  9/17: Hip flex: L 3/5 R 3+/5 abd: 2+/5, 3-/5; add 2/5 2+/5 11/4: hip flexion L 3+/5 R 3+/5 abd 2+/5, 2+/5 add 2+/5, 2+/5    Time  8    Period  Weeks    Status  Partially Met      PT LONG TERM GOAL #2   Title  Patient will increase ABC scale score >80% to demonstrate better functional mobility and better  confidence with ADLs.     Baseline  9/17: 68.13% 11/4: 76.6%    Time  8    Period  Weeks    Status  Partially Met      PT LONG TERM GOAL #3   Title  Patient will increase Berg Balance score by > 6 points (44/56)  to demonstrate decreased fall risk during functional activities.    Baseline  9/17: 38/56 11/4: 39/56 12/12: 43/56    Time  8    Period  Weeks    Status  Partially Met    Target Date   08/30/18      PT LONG TERM GOAL #4   Title  Patient will increase six minute walk test distance to >1000 for progression to community ambulator and improve gait ability    Baseline  9/18: 1300 ft w/o cane 11/4: 974f  12/12: 840 ft without cane    Time  8    Period  Weeks    Status  On-going    Target Date  08/30/18      PT LONG TERM GOAL #5   Title  Patient (> 679years old) will complete five times sit to stand test in < 15 seconds without UE support indicating an increased LE strength and improved balance.    Baseline  11/4: requires 1 UE support 12/12:  18 seconds no UE support    Time  8    Period  Weeks    Status  Partially Met            Plan - 09/16/18 1136    Clinical Impression Statement  Patient performed today's session in nonweightbearing due to pain in foot with weightbearing. Patient requires occasional tactile and verbal cueing for muscle contraction and recruitment however demonstrates fatigue with repeated recruitment indicating continued need for increasing capacity of mobility. Patient will continue to benefit from skilled physical therapy to improve strength, balance, and functional mobility    Rehab Potential  Fair    Clinical Impairments Affecting Rehab Potential  (+) good support system, high prior level of function (-) currently undergoing radiation therapy for prostate cancer, memory deficits     PT Frequency  2x / week    PT Duration  8 weeks    PT Treatment/Interventions  ADLs/Self Care Home Management;Aquatic Therapy;Cryotherapy;Moist Heat;Traction;Ultrasound;Therapeutic activities;Functional mobility training;Stair training;Gait training;DME Instruction;Therapeutic exercise;Balance training;Neuromuscular re-education;Patient/family education;Manual techniques;Passive range of motion;Energy conservation;Vestibular;Taping    PT Next Visit Plan  strength, balance     PT Home Exercise Plan  see above    Consulted and Agree with Plan of Care  Patient        Patient will benefit from skilled therapeutic intervention in order to improve the following deficits and impairments:  Abnormal gait, Cardiopulmonary status limiting activity, Decreased activity tolerance, Decreased balance, Decreased knowledge of precautions, Decreased endurance, Decreased coordination, Decreased cognition, Decreased knowledge of use of DME, Decreased mobility, Difficulty walking, Decreased safety awareness, Decreased strength, Impaired flexibility, Impaired perceived functional ability, Impaired sensation, Postural dysfunction, Improper body mechanics, Pain  Visit Diagnosis: Unsteadiness on feet  Muscle weakness (generalized)  Other abnormalities of gait and mobility     Problem List Patient Active Problem List   Diagnosis Date Noted  . [place]state cancer [place] 02/25/2018  . Prostatic cyst 01/15/2018  . 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 ([place] 09/15/2017  . Blood in stool   . Abnormal feces   . Stricture and stenosis of esophagus   . Mild cognitive impairment 08/18/2017  .  Senile purpura ([place]) 04/29/2017  . Anemia 04/29/2017  . Frequent falls 04/29/2017  . Benign localized hyperplasia of prostate with urinary obstruction 07/15/2016  . Elevated PSA 07/15/2016  . Incomplete emptying of bladder 07/15/2016  . Urge incontinence 07/15/2016  . Hypothyroidism 04/25/2016  . Macular degeneration 04/25/2016  . Erectile dysfunction due to arterial insufficiency   . Ischemic heart disease with chronotropic incompetence   . CN (constipation) 02/10/2015  . DD (diverticular disease) 02/10/2015  . Fatigue 02/10/2015  . Lichen planus 26/69/1675  . Restless leg 02/10/2015  . 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 B12 deficiency 02/10/2015  . COPD (chronic obstructive pulmonary disease) ([place]) 11/14/2014  . Post Inflammatory Lung Changes 11/14/2014  . PAF (paroxysmal atrial fibrillation) ([facility]) CHA2DS2-VASc = 4. AC = Eliquis   . Insomnia 12/09/2013  . OSA on CPAP   .  Dyspnea on exertion - -essentially resolved with BB dose reduction & wgt loss 03/29/2013  . Overweight (BMI 25.0-29.9) -- Wgt back up 01/17/2013  . Atherosclerotic heart disease of native coronary artery without angina pectoris - s/p CABG, occluded SVG-D1; patent LIMA-LAD, SVG-OM, SVG- RCA   . Essential hypertension   . Hyperlipidemia with target LDL less than 70   . Aorto-iliac disease ([place])   . BCC (basal cell carcinoma), eyelid 01/03/2013  . Allergic rhinitis 08/17/2009  . Adaptation reaction 05/28/2009  . Acid reflux 05/28/2009  . Arthritis, degenerative 05/28/2009  . 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 Carotid artery occlusion without infarction 06/01/2006  . PAD (peripheral artery disease) - bilateral common iliac stents 12/15/2005  . Aortoiliac occlusive disease ([place]) 11/30/2005  . Atherosclerotic heart disease of artery bypass graft 09/01/1997    Janna Arch, PT, DPT   09/16/2018, 12:00 PM  [place] MAIN [facility] SERVICES [address] [place], [place], [address] Phone: [phone]   Fax:  [phone]  Name: [name] MRN: [id number] Date of Birth: June 22, 1934

## 2018-09-20 ENCOUNTER — Encounter: Payer: Self-pay | Admitting: Physical Therapy

## 2018-09-20 ENCOUNTER — Ambulatory Visit: Payer: Medicare Other | Admitting: Physical Therapy

## 2018-09-20 DIAGNOSIS — R2681 Unsteadiness on feet: Secondary | ICD-10-CM | POA: Diagnosis not present

## 2018-09-20 DIAGNOSIS — R262 Difficulty in walking, not elsewhere classified: Secondary | ICD-10-CM

## 2018-09-20 DIAGNOSIS — M6281 Muscle weakness (generalized): Secondary | ICD-10-CM | POA: Diagnosis not present

## 2018-09-20 DIAGNOSIS — R2689 Other abnormalities of gait and mobility: Secondary | ICD-10-CM | POA: Diagnosis not present

## 2018-09-20 NOTE — Therapy (Signed)
[place] MAIN [facility] SERVICES [address] [place], [place], [address] Phone: [phone]   Fax:  [phone]  Physical Therapy Treatment  Patient Details  Name: [name] MRN: [id number] Date of Birth: July 08, 1934 No data recorded  Encounter Date: 09/20/2018  PT End of Session - 09/20/18 1357    Visit Number  27    Number of Visits  32    Date for PT Re-Evaluation  10/26/18    Authorization Type   7/10 starting 12/12    PT Start Time  1348    PT Stop Time  1430    PT Time Calculation (min)  42 min    Equipment Utilized During Treatment  Gait belt    Activity Tolerance  Patient tolerated treatment well;Patient limited by fatigue    Behavior During Therapy  WFL for tasks assessed/performed       Past Medical History:  Diagnosis Date  . Arthritis   . Atherosclerotic heart disease of native coronary artery without angina pectoris 1998   - s/p CABG, occluded SVG-D1; patent LIMA-LAD, SVG-OM, SVG- RCA  . Basal cell carcinoma of eyelid 01/03/2013   2019 - R side of Nose  . Benign neoplasm of ascending colon   . Benign neoplasm of descending colon   . Cancer [facility])    Skin cancer  . Colon polyps   . COPD (chronic obstructive pulmonary disease) ([place])   . GERD (gastroesophageal reflux disease)   . Hemorrhoid 02/10/2015  . History of hiatal hernia   . HOH (hard of hearing)   . Hypertension   . Memory loss   . Osteoporosis   . PAF (paroxysmal atrial fibrillation) ([place])   . Prostate disorder   . Sciatic pain    Chronic  . Sleep apnea    cpap  . Temporary cerebral vascular dysfunction 02/10/2015   Had negative work-up.  July, 2012.  No medication changes, had forgot them that day, got dehydrated.     Past Surgical History:  Procedure Laterality Date  . ANKLE SURGERY    . Arch aortogram and carotid aortogram  06/02/2006   Dr Oneida Alar did surgery  . BASAL CELL CARCINOMA EXCISION  04/2016   Dermatology  . CARDIAC CATHETERIZATION   01/30/1998    (Dr. Linard Millers): Native LAD & mRCA CTO. 90% D1. Mod Cx. SVG-D1 CTO. SVG-RCA & SVG-OM along with LIMA-LAD patent;  LV NORMAL Fxn  . CAROTID ENDARTERECTOMY Left Oct. 29, 2007   Dr. Oneida Alar:  . CATARACT EXTRACTION W/PHACO Right 03/05/2017   Procedure: CATARACT EXTRACTION PHACO AND INTRAOCULAR LENS PLACEMENT (IOC);  Surgeon: Leandrew Koyanagi, MD;  Location: [facility] ORS;  Service: Ophthalmology;  Laterality: Right;  [place] 00:58.5 AP% 10.6 CDE 6.20 Fluid Pack lot # [id number]  . CATARACT EXTRACTION W/PHACO Left 04/15/2017   Procedure: CATARACT EXTRACTION PHACO AND INTRAOCULAR LENS PLACEMENT ([place])  Left;  Surgeon: Leandrew Koyanagi, MD;  Location: [place];  Service: Ophthalmology;  Laterality: Left;  . COLONOSCOPY WITH PROPOFOL N/A 09/15/2017   Procedure: COLONOSCOPY WITH PROPOFOL;  Surgeon: Lucilla Lame, MD;  Location[facility] ENDOSCOPY;  Service: Endoscopy;  Laterality: N/A;  . CORONARY ARTERY BYPASS GRAFT  1998   LIMA-LAD, SVG-OM, SVG-RPDA, SVG-DIAG  . CPET / MET  12/2012   Mild chronotropic incompetence - read 82% of predicted; also reduced effort; peak VO2 15.7 / 75% (did not reach Max effort) -- suggested ischemic response in last 1.5 minutes of exercise. Normal pulmonary function on PFTs but poor response  to  . DOPPLER ECHOCARDIOGRAPHY  11/20/2010   EF =>55%; LV norm mild aortic scelorosis  . ESOPHAGOGASTRODUODENOSCOPY (EGD) WITH PROPOFOL N/A 09/15/2017   Procedure: ESOPHAGOGASTRODUODENOSCOPY (EGD) WITH PROPOFOL;  Surgeon: Lucilla Lame, MD;  Location: [facility] ENDOSCOPY;  Service: Endoscopy;  Laterality: N/A;  . FRACTURE SURGERY Right 03/19/2015   wrist  . HIATAL HERNIA REPAIR    . ILIAC ARTERY STENT  12/15/2005   PTA and direct stenting rgt and lft common iliac arteries  . NM GATED MYOVIEW ([place] HX)  12/2017   Low Risk - no ischemia or infarct.  EF > 65%. no RWMA  . NM MYOCAR PERF WALL MOTION  11/11/2010; February 2016   a) TM STRESS----NORMAL PERFUSION ,EF 68%; b) EF 50-55%.  Normal LV function. No significant ischemia or infarction.  . ORIF ELBOW FRACTURE Right 01/01/2017   Procedure: OPEN REDUCTION INTERNAL FIXATION (ORIF) ELBOW/OLECRANON FRACTURE;  Surgeon: Hessie Knows, MD;  Location: [facility] ORS;  Service: Orthopedics;  Laterality: Right;  . ORIF WRIST FRACTURE Right 03/19/2015   Procedure: OPEN REDUCTION INTERNAL FIXATION (ORIF) WRIST FRACTURE;  Surgeon: Hessie Knows, MD;  Location: [facility] ORS;  Service: Orthopedics;  Laterality: Right;  . THORACIC AORTA - CAROTID ANGIOGRAM  October 2007   Dr. 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 TRANSTHORACIC ECHOCARDIOGRAM  February 2016   [facility]: Normal LV function. Dilated left atrium.    There were no vitals filed for this visit.  Subjective Assessment - 09/20/18 1352    Subjective  Patient states that he did fall when he was drying his hair after a shower. Patient states that he did not hit his head, but did get a scratch on his periscapular area. He was managed onsite at the ALF where he resides.     Pertinent History  Patient goes 5x/week for radiation therapy for prostate cancer. Is getting weaker and no longer goes to the senior center. Just started using cane, when walks a lot his low back and legs ache. He has a significant CV history including quadruple bypass, CAD w cardiac stents, on Eliquis, left carotid endarterectomy, AAA. He is functionally independent but lives alone, his partner lives 5 minutes down the road and seems to assist him with daily living. Activity as tolerated per patient report. Has history of memory deficits.     Limitations  Walking;Standing;House hold activities;Other (comment);Lifting    How long can you sit comfortably?  n/a    How long can you stand comfortably?  Reports that he has not used his cane in about 1 month    How long can you walk comfortably?  200-300 yards    Patient Stated Goals  Pt would still  like to work on his balance    Currently in Pain?  No/denies       TREATMENT  Therapeutic Exercise:  4# ankle weights  -seated marching, with VCs for unsupported sitting posture, x15 each  -seated LAQ, x10 each  -standing hip extension x10 each  -standing hip abduction x10 each  -standing hamstring curls x10 each  Neuromuscular Re-education: Airex: Narrow BOS, with vertical and horizontal head turns x10 each, no UE support CGA, VCs for ankle strategy to prevent posterior LOB  Narrow BOS, with 4# bar lift BUE, CGA, VCs for balance strategies  Narrow BOS, with 4# bar lift and horizontal head turns, CGA, VCs for pace and balance strategies. Patient had one LOB when combining UE activity with head  turns to mimic the conditions of his fall.  Low Rockerboard: A/P taps x20 A/P balance x1 min M/L taps x20 M/L balance x1 min  Patient Response to interventions: Patient continues to fatigue easily, but demonstrates and articulates the implementation of balance strategies, as well as appropriate safety compensations. Patient articulated the following safety strategy to prevent another fall when drying off from the shower: "I think I will sit down to towel off instead of standing."    PT Education - 09/20/18 1356    Education provided  Yes    Education Details  exercise technique, balance strategies    Person(s) Educated  Patient    Methods  Explanation;Demonstration;Tactile cues;Verbal cues    Comprehension  Verbalized understanding;Returned demonstration;Need further instruction;Verbal cues required;Tactile cues required       PT Short Term Goals - 08/12/18 1313      PT SHORT TERM GOAL #1   Title  Patient will be independent in home exercise program to improve strength/mobility for better functional independence with ADLs.    Baseline  9/17: HEP given 11/4: compliant at this time    Time  2    Period  Weeks    Status  Partially Met      PT SHORT TERM GOAL #2   Title  Patient  will deny any falls over past 2 weeks to demonstrate improved safety awareness at home and work.     Baseline  9/17: progressive weakness 11/4: denies falls last 2 weeks    Time  2    Period  Weeks    Status  Achieved      PT SHORT TERM GOAL #3   Title  Patient will increase BLE gross strength to 4-/5 as to improve functional strength for independent gait, increased standing tolerance and increased ADL ability.    Baseline  9/17: Hip flex: L 3/5 R 3+/5 abd: 2+/5, 3-/5; add 2/5 2+/5 11/4: hip flexion L 3+/5 R 3+/5 abd 2+/5, 2+/5 add 2+/5, 2+/5    Time  2    Period  Weeks    Status  Partially Met        PT Long Term Goals - 08/12/18 1313      PT LONG TERM GOAL #1   Title  Patient will increase BLE gross strength to 4+/5 as to improve functional strength for independent gait, increased standing tolerance and increased ADL ability.    Baseline  9/17: Hip flex: L 3/5 R 3+/5 abd: 2+/5, 3-/5; add 2/5 2+/5 11/4: hip flexion L 3+/5 R 3+/5 abd 2+/5, 2+/5 add 2+/5, 2+/5    Time  8    Period  Weeks    Status  Partially Met      PT LONG TERM GOAL #2   Title  Patient will increase ABC scale score >80% to demonstrate better functional mobility and better confidence with ADLs.     Baseline  9/17: 68.13% 11/4: 76.6%    Time  8    Period  Weeks    Status  Partially Met      PT LONG TERM GOAL #3   Title  Patient will increase Berg Balance score by > 6 points (44/56)  to demonstrate decreased fall risk during functional activities.    Baseline  9/17: 38/56 11/4: 39/56 12/12: 43/56    Time  8    Period  Weeks    Status  Partially Met    Target Date  08/30/18      PT LONG  TERM GOAL #4   Title  Patient will increase six minute walk test distance to >1000 for progression to community ambulator and improve gait ability    Baseline  9/18: 1300 ft w/o cane 11/4: 970f  12/12: 840 ft without cane    Time  8    Period  Weeks    Status  On-going    Target Date  08/30/18      PT LONG TERM GOAL #5    Title  Patient (> 826years old) will complete five times sit to stand test in < 15 seconds without UE support indicating an increased LE strength and improved balance.    Baseline  11/4: requires 1 UE support 12/12:  18 seconds no UE support    Time  8    Period  Weeks    Status  Partially Met            Plan - 09/20/18 1358    Clinical Impression Statement  Patient presents with excellent motivation despite having incurred a fall since his last visit. Patient's largest balance deficits occur when he is engaged in UE tasks. He continues to have a posterior lean with decreased reliance on an ankle strategy. Patient will benefit from continued skilled therapeutic intervention to improve strength and balance for increased safety and improved overall QOL.    Rehab Potential  Fair    Clinical Impairments Affecting Rehab Potential  (+) good support system, high prior level of function (-) currently undergoing radiation therapy for prostate cancer, memory deficits     PT Frequency  2x / week    PT Duration  8 weeks    PT Treatment/Interventions  ADLs/Self Care Home Management;Aquatic Therapy;Cryotherapy;Moist Heat;Traction;Ultrasound;Therapeutic activities;Functional mobility training;Stair training;Gait training;DME Instruction;Therapeutic exercise;Balance training;Neuromuscular re-education;Patient/family education;Manual techniques;Passive range of motion;Energy conservation;Vestibular;Taping    PT Next Visit Plan  strength, balance     PT Home Exercise Plan  see above    Consulted and Agree with Plan of Care  Patient       Patient will benefit from skilled therapeutic intervention in order to improve the following deficits and impairments:  Abnormal gait, Cardiopulmonary status limiting activity, Decreased activity tolerance, Decreased balance, Decreased knowledge of precautions, Decreased endurance, Decreased coordination, Decreased cognition, Decreased knowledge of use of DME, Decreased  mobility, Difficulty walking, Decreased safety awareness, Decreased strength, Impaired flexibility, Impaired perceived functional ability, Impaired sensation, Postural dysfunction, Improper body mechanics, Pain  Visit Diagnosis: Unsteadiness on feet  Muscle weakness (generalized)  Other abnormalities of gait and mobility  Difficulty in walking, not elsewhere classified     Problem List Patient Active Problem List   Diagnosis Date Noted  . Prostate cancer ([place] 02/25/2018  . Prostatic cyst 01/15/2018  . 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 ([place] 09/15/2017  . Blood in stool   . Abnormal feces   . Stricture and stenosis of esophagus   . Mild cognitive impairment 08/18/2017  . [place]ile purp[place] 04/29/2017  . Anemia 04/29/2017  . Frequent falls 04/29/2017  . Benign localized hyperplasia of prostate with urinary obstruction 07/15/2016  . Elevated PSA 07/15/2016  . Incomplete emptying of bladder 07/15/2016  . Urge incontinence 07/15/2016  . Hypothyroidism 04/25/2016  . Macular degeneration 04/25/2016  . Erectile dysfunction due to arterial insufficiency   . Ischemic heart disease with chronotropic incompetence   . CN (constipation) 02/10/2015  . DD (diverticular disease) 02/10/2015  . Fatigue 02/10/2015  . Lichen planus 053/64/6803 . Restless leg 02/10/2015  . Circadian rhythm disorder  02/10/2015  . B12 deficiency 02/10/2015  . COPD (chronic obstructive pulmonary disease) ([place]) 11/14/2014  . Post Inflammatory Lung Changes 11/14/2014  . PAF (paroxysmal atrial fibrillation) ([facility]) CHA2DS2-VASc = 4. AC = Eliquis   . Insomnia 12/09/2013  . OSA on CPAP   . Dyspnea on exertion - -essentially resolved with BB dose reduction & wgt loss 03/29/2013  . Overweight (BMI 25.0-29.9) -- Wgt back up 01/17/2013  . Atherosclerotic heart disease of native coronary artery without angina pectoris - s/p CABG, occluded SVG-D1; patent LIMA-LAD, SVG-OM, SVG- RCA   . Essential hypertension   .  Hyperlipidemia with target LDL less than 70   . Aorto-iliac disease ([place])   . BCC (basal cell carcinoma), eyelid 01/03/2013  . Allergic rhinitis 08/17/2009  . Adaptation reaction 05/28/2009  . Acid reflux 05/28/2009  . Arthritis, degenerative 05/28/2009  . 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 Carotid artery occlusion without infarction 06/01/2006  . PAD (peripheral artery disease) - bilateral common iliac stents 12/15/2005  . Aortoiliac occlusive disease ([place]) 11/30/2005  . Atherosclerotic heart disease of artery bypass graft 09/01/1997   Myles Gip PT, DPT [phone] 09/20/2018, 3:56 PM  [place] MAIN [facility] SERVICES [address] [place], [place], [address] Phone: [phone]   Fax:  [phone]  Name: [name] MRN: [id number] Date of Birth: 1934-05-20

## 2018-09-21 DIAGNOSIS — H353113 Nonexudative age-related macular degeneration, right eye, advanced atrophic without subfoveal involvement: Secondary | ICD-10-CM | POA: Diagnosis not present

## 2018-09-22 ENCOUNTER — Inpatient Hospital Stay: Payer: Medicare Other | Attending: Radiation Oncology

## 2018-09-22 ENCOUNTER — Ambulatory Visit: Payer: Medicare Other

## 2018-09-22 DIAGNOSIS — R262 Difficulty in walking, not elsewhere classified: Secondary | ICD-10-CM | POA: Diagnosis not present

## 2018-09-22 DIAGNOSIS — R9721 Rising PSA following treatment for malignant neoplasm of prostate: Secondary | ICD-10-CM | POA: Diagnosis not present

## 2018-09-22 DIAGNOSIS — R2681 Unsteadiness on feet: Secondary | ICD-10-CM

## 2018-09-22 DIAGNOSIS — M6281 Muscle weakness (generalized): Secondary | ICD-10-CM

## 2018-09-22 DIAGNOSIS — C61 Malignant neoplasm of prostate: Secondary | ICD-10-CM | POA: Diagnosis not present

## 2018-09-22 DIAGNOSIS — R2689 Other abnormalities of gait and mobility: Secondary | ICD-10-CM

## 2018-09-22 LAB — PSA: Prostatic Specific Antigen: 0.1 ng/mL (ref 0.00–4.00)

## 2018-09-22 NOTE — Therapy (Signed)
[place] MAIN [facility] SERVICES [address] [place], [place], [address] Phone: [phone]   Fax:  [phone]  Physical Therapy Treatment  Patient Details  Name: [name] MRN: [id number] Date of Birth: [birth year] No data recorded  Encounter Date: 09/22/2018  PT End of Session - 09/22/18 1306    Visit Number  28    Number of Visits  32    Date for PT Re-Evaluation  10/26/18    Authorization Type   8/10 starting 12/12    PT Start Time  1259    PT Stop Time  1344    PT Time Calculation (min)  45 min    Equipment Utilized During Treatment  Gait belt    Activity Tolerance  Patient tolerated treatment well;Patient limited by fatigue    Behavior During Therapy  WFL for tasks assessed/performed       Past Medical History:  Diagnosis Date  . Arthritis   . Atherosclerotic heart disease of native coronary artery without angina pectoris 1998   - s/p CABG, occluded SVG-D1; patent LIMA-LAD, SVG-OM, SVG- RCA  . Basal cell carcinoma of eyelid 01/03/2013   2019 - R side of Nose  . Benign neoplasm of ascending colon   . Benign neoplasm of descending colon   . Cancer [facility])    Skin cancer  . Colon polyps   . COPD (chronic obstructive pulmonary disease) ([place])   . GERD (gastroesophageal reflux disease)   . Hemorrhoid 02/10/2015  . History of hiatal hernia   . HOH (hard of hearing)   . Hypertension   . Memory loss   . Osteoporosis   . PAF (paroxysmal atrial fibrillation) ([place])   . Prostate disorder   . Sciatic pain    Chronic  . Sleep apnea    cpap  . Temporary cerebral vascular dysfunction 02/10/2015   Had negative work-up.  July, 2012.  No medication changes, had forgot them that day, got dehydrated.     Past Surgical History:  Procedure Laterality Date  . ANKLE SURGERY    . Arch aortogram and carotid aortogram  06/02/2006   Dr Oneida Alar did surgery  . BASAL CELL CARCINOMA EXCISION  04/2016   Dermatology  . CARDIAC CATHETERIZATION   01/30/1998    (Dr. Linard Millers): Native LAD & mRCA CTO. 90% D1. Mod Cx. SVG-D1 CTO. SVG-RCA & SVG-OM along with LIMA-LAD patent;  LV NORMAL Fxn  . CAROTID ENDARTERECTOMY Left Oct. 29, 2007   Dr. Oneida Alar:  . CATARACT EXTRACTION W/PHACO Right 03/05/2017   Procedure: CATARACT EXTRACTION PHACO AND INTRAOCULAR LENS PLACEMENT (IOC);  Surgeon: Leandrew Koyanagi, MD;  Location: [facility] ORS;  Service: Ophthalmology;  Laterality: Right;  [place] 00:58.5 AP% 10.6 CDE 6.20 Fluid Pack lot # [id number]  . CATARACT EXTRACTION W/PHACO Left 04/15/2017   Procedure: CATARACT EXTRACTION PHACO AND INTRAOCULAR LENS PLACEMENT ([place])  Left;  Surgeon: Leandrew Koyanagi, MD;  Location: [place];  Service: Ophthalmology;  Laterality: Left;  . COLONOSCOPY WITH PROPOFOL N/A 09/15/2017   Procedure: COLONOSCOPY WITH PROPOFOL;  Surgeon: Lucilla Lame, MD;  Location: [facility] ENDOSCOPY;  Service: Endoscopy;  Laterality: N/A;  . CORONARY ARTERY BYPASS GRAFT  1998   LIMA-LAD, SVG-OM, SVG-RPDA, SVG-DIAG  . CPET / MET  12/2012   Mild chronotropic incompetence - read 82% of predicted; also reduced effort; peak VO2 15.7 / 75% (did not reach Max effort) -- suggested ischemic response in last 1.5 minutes of exercise. Normal pulmonary function on PFTs but poor response  to  . DOPPLER ECHOCARDIOGRAPHY  11/20/2010   EF =>55%; LV norm mild aortic scelorosis  . ESOPHAGOGASTRODUODENOSCOPY (EGD) WITH PROPOFOL N/A 09/15/2017   Procedure: ESOPHAGOGASTRODUODENOSCOPY (EGD) WITH PROPOFOL;  Surgeon: Lucilla Lame, MD;  Location: [facility] ENDOSCOPY;  Service: Endoscopy;  Laterality: N/A;  . FRACTURE SURGERY Right 03/19/2015   wrist  . HIATAL HERNIA REPAIR    . ILIAC ARTERY STENT  12/15/2005   PTA and direct stenting rgt and lft common iliac arteries  . NM GATED MYOVIEW ([place] HX)  12/2017   Low Risk - no ischemia or infarct.  EF > 65%. no RWMA  . NM MYOCAR PERF WALL MOTION  11/11/2010; February 2016   a) TM STRESS----NORMAL PERFUSION ,EF 68%; b) EF 50-55%.  Normal LV function. No significant ischemia or infarction.  . ORIF ELBOW FRACTURE Right 01/01/2017   Procedure: OPEN REDUCTION INTERNAL FIXATION (ORIF) ELBOW/OLECRANON FRACTURE;  Surgeon: Hessie Knows, MD;  Location: [facility] ORS;  Service: Orthopedics;  Laterality: Right;  . ORIF WRIST FRACTURE Right 03/19/2015   Procedure: OPEN REDUCTION INTERNAL FIXATION (ORIF) WRIST FRACTURE;  Surgeon: Hessie Knows, MD;  Location: [facility] ORS;  Service: Orthopedics;  Laterality: Right;  . THORACIC AORTA - CAROTID ANGIOGRAM  October 2007   Dr. 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 TRANSTHORACIC ECHOCARDIOGRAM  February 2016   [facility]: Normal LV function. Dilated left atrium.    There were no vitals filed for this visit.  Subjective Assessment - 09/22/18 1304    Subjective  Patient reports he may be a little slower today due to having to get blood drawn prior to therapy session. Reports no falls since last session.     Pertinent History  Patient goes 5x/week for radiation therapy for prostate cancer. Is getting weaker and no longer goes to the senior center. Just started using cane, when walks a lot his low back and legs ache. He has a significant CV history including quadruple bypass, CAD w cardiac stents, on Eliquis, left carotid endarterectomy, AAA. He is functionally independent but lives alone, his partner lives 5 minutes down the road and seems to assist him with daily living. Activity as tolerated per patient report. Has history of memory deficits.     Limitations  Walking;Standing;House hold activities;Other (comment);Lifting    How long can you sit comfortably?  n/a    How long can you stand comfortably?  Reports that he has not used his cane in about 1 month    How long can you walk comfortably?  200-300 yards    Patient Stated Goals  Pt would still like to work on his balance    Currently in Pain?  No/denies           TREATMENT  Therapeutic Exercise:  5# ankle weights             -seated marching, with VCs for unsupported sitting posture, x15 each             -seated LAQ, x10 each; verbal cues for velocity of movement for increased muscle recruitment             -standing hip extension x10 each; verbal cueing for upright posture and gluteal activation,              -standing hip abduction x10 each; verbal cues for keeping toes forward              -standing hamstring curls x10 each,  terminated early due to back pain  Seated hamstring stretch 2x 30 seconds to decrease low back pain  Neuromuscular Re-education: Airex:  Narrow BOS, with vertical and horizontal head turns x10 each, no UE support CGA, VCs for ankle strategy to prevent posterior LOB   Eyes closed, CGA 2x 30 seconds, vc's for ankle stability strategies   saebo ball transfer 2 minutes. CGA, challenged with placement of rings when reaching to further limits of stability resulting in poor coordination with taller rings.    Balloon taps reaching inside and outside BOS   Half foam roller: df/pf 20x with CGA and verbal cues for ankle range.   Patient Response to interventions: Patient continues to fatigue easily, but demonstrates and articulates the implementation of balance strategies, as well as appropriate safety compensations. Patient articulated the following safety strategy to prevent another fall when drying off from the shower: "I think I will sit down to towel off instead of standing."                       PT Education - 09/22/18 1305    Education provided  Yes    Education Details  exercise technique, balance strategies     Person(s) Educated  Patient    Methods  Explanation;Demonstration;Tactile cues;Verbal cues    Comprehension  Verbalized understanding;Returned demonstration;Verbal cues required;Tactile cues required;Need further instruction       PT Short Term Goals - 08/12/18 1313      PT SHORT  TERM GOAL #1   Title  Patient will be independent in home exercise program to improve strength/mobility for better functional independence with ADLs.    Baseline  9/17: HEP given 11/4: compliant at this time    Time  2    Period  Weeks    Status  Partially Met      PT SHORT TERM GOAL #2   Title  Patient will deny any falls over past 2 weeks to demonstrate improved safety awareness at home and work.     Baseline  9/17: progressive weakness 11/4: denies falls last 2 weeks    Time  2    Period  Weeks    Status  Achieved      PT SHORT TERM GOAL #3   Title  Patient will increase BLE gross strength to 4-/5 as to improve functional strength for independent gait, increased standing tolerance and increased ADL ability.    Baseline  9/17: Hip flex: L 3/5 R 3+/5 abd: 2+/5, 3-/5; add 2/5 2+/5 11/4: hip flexion L 3+/5 R 3+/5 abd 2+/5, 2+/5 add 2+/5, 2+/5    Time  2    Period  Weeks    Status  Partially Met        PT Long Term Goals - 08/12/18 1313      PT LONG TERM GOAL #1   Title  Patient will increase BLE gross strength to 4+/5 as to improve functional strength for independent gait, increased standing tolerance and increased ADL ability.    Baseline  9/17: Hip flex: L 3/5 R 3+/5 abd: 2+/5, 3-/5; add 2/5 2+/5 11/4: hip flexion L 3+/5 R 3+/5 abd 2+/5, 2+/5 add 2+/5, 2+/5    Time  8    Period  Weeks    Status  Partially Met      PT LONG TERM GOAL #2   Title  Patient will increase ABC scale score >80% to demonstrate better functional mobility and better confidence with ADLs.  Baseline  9/17: 68.13% 11/4: 76.6%    Time  8    Period  Weeks    Status  Partially Met      PT LONG TERM GOAL #3   Title  Patient will increase Berg Balance score by > 6 points (44/56)  to demonstrate decreased fall risk during functional activities.    Baseline  9/17: 38/56 11/4: 39/56 12/12: 43/56    Time  8    Period  Weeks    Status  Partially Met    Target Date  08/30/18      PT LONG TERM GOAL #4    Title  Patient will increase six minute walk test distance to >1000 for progression to community ambulator and improve gait ability    Baseline  9/18: 1300 ft w/o cane 11/4: 930f  12/12: 840 ft without cane    Time  8    Period  Weeks    Status  On-going    Target Date  08/30/18      PT LONG TERM GOAL #5   Title  Patient (> 661years old) will complete five times sit to stand test in < 15 seconds without UE support indicating an increased LE strength and improved balance.    Baseline  11/4: requires 1 UE support 12/12:  18 seconds no UE support    Time  8    Period  Weeks    Status  Partially Met            Plan - 09/22/18 1331    Clinical Impression Statement  Patient presents with good motivation despite fatigue from recent blood draw to today's session. Patient is challenged with stability when reaching UE's towards outer limit of base of support. Strength continues to progress with increased weight on ankle weights. Patient will benefit from continued skilled therapeutic intervention to improve strength and balance for increased safety and improved overall QOL    Rehab Potential  Fair    Clinical Impairments Affecting Rehab Potential  (+) good support system, high prior level of function (-) currently undergoing radiation therapy for prostate cancer, memory deficits     PT Frequency  2x / week    PT Duration  8 weeks    PT Treatment/Interventions  ADLs/Self Care Home Management;Aquatic Therapy;Cryotherapy;Moist Heat;Traction;Ultrasound;Therapeutic activities;Functional mobility training;Stair training;Gait training;DME Instruction;Therapeutic exercise;Balance training;Neuromuscular re-education;Patient/family education;Manual techniques;Passive range of motion;Energy conservation;Vestibular;Taping    PT Next Visit Plan  strength, balance     PT Home Exercise Plan  see above    Consulted and Agree with Plan of Care  Patient       Patient will benefit from skilled therapeutic  intervention in order to improve the following deficits and impairments:  Abnormal gait, Cardiopulmonary status limiting activity, Decreased activity tolerance, Decreased balance, Decreased knowledge of precautions, Decreased endurance, Decreased coordination, Decreased cognition, Decreased knowledge of use of DME, Decreased mobility, Difficulty walking, Decreased safety awareness, Decreased strength, Impaired flexibility, Impaired perceived functional ability, Impaired sensation, Postural dysfunction, Improper body mechanics, Pain  Visit Diagnosis: Unsteadiness on feet  Muscle weakness (generalized)  Other abnormalities of gait and mobility     Problem List Patient Active Problem List   Diagnosis Date Noted  . [place] 02/25/2018  . Prostatic cyst 01/15/2018  . 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 ([place] 09/15/2017  . Blood in stool   . Abnormal feces   . Stricture and stenosis of esophagus   . Mild cognitive impairment 08/18/2017  . Senile purpura ([place]  04/29/2017  . Anemia 04/29/2017  . Frequent falls 04/29/2017  . Benign localized hyperplasia of prostate with urinary obstruction 07/15/2016  . Elevated PSA 07/15/2016  . Incomplete emptying of bladder 07/15/2016  . Urge incontinence 07/15/2016  . Hypothyroidism 04/25/2016  . Macular degeneration 04/25/2016  . Erectile dysfunction due to arterial insufficiency   . Ischemic heart disease with chronotropic incompetence   . CN (constipation) 02/10/2015  . DD (diverticular disease) 02/10/2015  . Fatigue 02/10/2015  . Lichen planus 78/67/6720  . Restless leg 02/10/2015  . 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 B12 deficiency 02/10/2015  . COPD (chronic obstructive pulmonary disease) ([place]) 11/14/2014  . Post Inflammatory Lung Changes 11/14/2014  . PAF (paroxysmal atrial fibrillation) ([facility]) CHA2DS2-VASc = 4. AC = Eliquis   . Insomnia 12/09/2013  . OSA on CPAP   . Dyspnea on exertion - -essentially resolved with BB dose  reduction & wgt loss 03/29/2013  . Overweight (BMI 25.0-29.9) -- Wgt back up 01/17/2013  . Atherosclerotic heart disease of native coronary artery without angina pectoris - s/p CABG, occluded SVG-D1; patent LIMA-LAD, SVG-OM, SVG- RCA   . Essential hypertension   . Hyperlipidemia with target LDL less than 70   . Aorto-iliac disease ([place])   . BCC (basal cell carcinoma), eyelid 01/03/2013  . Allergic rhinitis 08/17/2009  . Adaptation reaction 05/28/2009  . Acid reflux 05/28/2009  . Arthritis, degenerative 05/28/2009  . 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 Carotid artery occlusion without infarction 06/01/2006  . PAD (peripheral artery disease) - bilateral common iliac stents 12/15/2005  . Aortoiliac occlusive disease ([place]) 11/30/2005  . Atherosclerotic heart disease of artery bypass graft 09/01/1997   Janna Arch, PT, DPT   09/22/2018, 1:45 PM  [place] MAIN [facility] SERVICES [address] [place], [place], [address] Phone: [phone]   Fax:  [phone]  Name: [name] MRN: [id number] Date of Birth: 28-Apr-1934

## 2018-09-28 ENCOUNTER — Ambulatory Visit: Payer: Medicare Other

## 2018-09-28 DIAGNOSIS — R2681 Unsteadiness on feet: Secondary | ICD-10-CM | POA: Diagnosis not present

## 2018-09-28 DIAGNOSIS — M6281 Muscle weakness (generalized): Secondary | ICD-10-CM | POA: Diagnosis not present

## 2018-09-28 DIAGNOSIS — R262 Difficulty in walking, not elsewhere classified: Secondary | ICD-10-CM

## 2018-09-28 DIAGNOSIS — R2689 Other abnormalities of gait and mobility: Secondary | ICD-10-CM | POA: Diagnosis not present

## 2018-09-28 NOTE — Therapy (Signed)
[place] MAIN [facility] SERVICES [address] [place], [place], [address] Phone: [phone]   Fax:  [phone]  Physical Therapy Treatment  Patient Details  Name: [name] MRN: [id number] Date of Birth: 06-Nov-1933 No data recorded  Encounter Date: 09/28/2018  PT End of Session - 09/28/18 1517    Visit Number  29    Number of Visits  32    Date for PT Re-Evaluation  10/26/18    Authorization Type   9/10 starting 12/12    PT Start Time  1430    PT Stop Time  1514    PT Time Calculation (min)  44 min    Equipment Utilized During Treatment  Gait belt    Activity Tolerance  Patient tolerated treatment well;Patient limited by fatigue    Behavior During Therapy  WFL for tasks assessed/performed       Past Medical History:  Diagnosis Date  . Arthritis   . Atherosclerotic heart disease of native coronary artery without angina pectoris 1998   - s/p CABG, occluded SVG-D1; patent LIMA-LAD, SVG-OM, SVG- RCA  . Basal cell carcinoma of eyelid 01/03/2013   2019 - R side of Nose  . Benign neoplasm of ascending colon   . Benign neoplasm of descending colon   . Cancer [facility])    Skin cancer  . Colon polyps   . COPD (chronic obstructive pulmonary disease) ([place])   . GERD (gastroesophageal reflux disease)   . Hemorrhoid 02/10/2015  . History of hiatal hernia   . HOH (hard of hearing)   . Hypertension   . Memory loss   . Osteoporosis   . PAF (paroxysmal atrial fibrillation) ([place])   . Prostate disorder   . Sciatic pain    Chronic  . Sleep apnea    cpap  . Temporary cerebral vascular dysfunction 02/10/2015   Had negative work-up.  July, 2012.  No medication changes, had forgot them that day, got dehydrated.     Past Surgical History:  Procedure Laterality Date  . ANKLE SURGERY    . Arch aortogram and carotid aortogram  06/02/2006   Dr Oneida Alar did surgery  . BASAL CELL CARCINOMA EXCISION  04/2016   Dermatology  . CARDIAC CATHETERIZATION   01/30/1998    (Dr. Linard Millers): Native LAD & mRCA CTO. 90% D1. Mod Cx. SVG-D1 CTO. SVG-RCA & SVG-OM along with LIMA-LAD patent;  LV NORMAL Fxn  . CAROTID ENDARTERECTOMY Left Oct. 29, 2007   Dr. Oneida Alar:  . CATARACT EXTRACTION W/PHACO Right 03/05/2017   Procedure: CATARACT EXTRACTION PHACO AND INTRAOCULAR LENS PLACEMENT (IOC);  Surgeon: Leandrew Koyanagi, MD;  Location: [facility] ORS;  Service: Ophthalmology;  Laterality: Right;  [place] 00:58.5 AP% 10.6 CDE 6.20 Fluid Pack lot # [id number]  . CATARACT EXTRACTION W/PHACO Left 04/15/2017   Procedure: CATARACT EXTRACTION PHACO AND INTRAOCULAR LENS PLACEMENT ([place])  Left;  Surgeon: Leandrew Koyanagi, MD;  Location: [place];  Service: Ophthalmology;  Laterality: Left;  . COLONOSCOPY WITH PROPOFOL N/A 09/15/2017   Procedure: COLONOSCOPY WITH PROPOFOL;  Surgeon: Lucilla Lame, MD;  Location[facility] ENDOSCOPY;  Service: Endoscopy;  Laterality: N/A;  . CORONARY ARTERY BYPASS GRAFT  1998   LIMA-LAD, SVG-OM, SVG-RPDA, SVG-DIAG  . CPET / MET  12/2012   Mild chronotropic incompetence - read 82% of predicted; also reduced effort; peak VO2 15.7 / 75% (did not reach Max effort) -- suggested ischemic response in last 1.5 minutes of exercise. Normal pulmonary function on PFTs but poor response  to  . DOPPLER ECHOCARDIOGRAPHY  11/20/2010   EF =>55%; LV norm mild aortic scelorosis  . ESOPHAGOGASTRODUODENOSCOPY (EGD) WITH PROPOFOL N/A 09/15/2017   Procedure: ESOPHAGOGASTRODUODENOSCOPY (EGD) WITH PROPOFOL;  Surgeon: Lucilla Lame, MD;  Location: [facility] ENDOSCOPY;  Service: Endoscopy;  Laterality: N/A;  . FRACTURE SURGERY Right 03/19/2015   wrist  . HIATAL HERNIA REPAIR    . ILIAC ARTERY STENT  12/15/2005   PTA and direct stenting rgt and lft common iliac arteries  . NM GATED MYOVIEW ([place] H[facility])  12/2017   Low Risk - no ischemia or infarct.  EF > 65%. no RWMA  . NM MYOCAR PERF WALL MOTION  11/11/2010; February 2016   a) TM STRESS----NORMAL PERFUSION ,EF 68%; b) EF 50-55%.  Normal LV function. No significant ischemia or infarction.  . ORIF ELBOW FRACTURE Right 01/01/2017   Procedure: OPEN REDUCTION INTERNAL FIXATION (ORIF) ELBOW/OLECRANON FRACTURE;  Surgeon: Hessie Knows, MD;  Location: [facility] ORS;  Service: Orthopedics;  Laterality: Right;  . ORIF WRIST FRACTURE Right 03/19/2015   Procedure: OPEN REDUCTION INTERNAL FIXATION (ORIF) WRIST FRACTURE;  Surgeon: Hessie Knows, MD;  Location: [facility] ORS;  Service: Orthopedics;  Laterality: Right;  . THORACIC AORTA - CAROTID ANGIOGRAM  October 2007   Dr. 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 TRANSTHORACIC ECHOCARDIOGRAM  February 2016   [facility]: Normal LV function. Dilated left atrium.    There were no vitals filed for this visit.  Subjective Assessment - 09/28/18 1509    Subjective  Patient reports he will be moving home tomorrow. Will be having someone come in 8-12 5 days a week. Son has remodeled house to be accessible.     Pertinent History  Patient goes 5x/week for radiation therapy for prostate cancer. Is getting weaker and no longer goes to the senior center. Just started using cane, when walks a lot his low back and legs ache. He has a significant CV history including quadruple bypass, CAD w cardiac stents, on Eliquis, left carotid endarterectomy, AAA. He is functionally independent but lives alone, his partner lives 5 minutes down the road and seems to assist him with daily living. Activity as tolerated per patient report. Has history of memory deficits.     Limitations  Walking;Standing;House hold activities;Other (comment);Lifting    How long can you sit comfortably?  n/a    How long can you stand comfortably?  Reports that he has not used his cane in about 1 month    How long can you walk comfortably?  200-300 yards    Patient Stated Goals  Pt would still like to work on his balance    Currently in Pain?  No/denies        Access Code: [email or web address]  URL: [email or web address]  Date: 09/28/2018  Prepared by: Janna Arch   Exercises  Standing March with Counter Support - 10 reps - 2 sets - 5 hold - 1x daily - 7x weekly  Standing Hip Abduction with Counter Support - 10 reps - 2 sets - 5 hold - 1x daily - 7x weekly  Standing Hip Extension with Counter Support - 10 reps - 2 sets - 5 hold - 1x daily - 7x weekly  Seated Heel Toe Raises - 10 reps - 2 sets - 5 hold - 1x daily - 7x weekly  Sit to Stand without Arm Support - 10 reps - 2 sets - 5 hold - 1x daily - 7x weekly  Standing  Tandem Balance with Counter Support - 5 reps - 2 sets - 30 hold - 1x daily - 7x weekly   Airex pad: basketball toss reaching inside and outside BOS  Airex pad: football tosses throwing ball back and forth with ankle reactions and no LOB, slight instability with reaching further outside BOS.   step over and back orange hurdle SUE support 10x, CGA challenging lifting leg back over hurdle without hitting hurdle.   Half foam roller: df pf rocking with BUE support    Patient Response to interventions: Patient continues to fatigue easily, but demonstrates and articulates the implementation of balance strategies, as well as appropriate safety compensations.                      PT Education - 09/28/18 1510    Education provided  Yes    Education Details  exercise technique, balance strategies, HEP     Person(s) Educated  Patient    Methods  Explanation;Demonstration;Tactile cues;Verbal cues    Comprehension  Verbalized understanding;Returned demonstration;Verbal cues required;Tactile cues required;Need further instruction       PT Short Term Goals - 08/12/18 1313      PT SHORT TERM GOAL #1   Title  Patient will be independent in home exercise program to improve strength/mobility for better functional independence with ADLs.    Baseline  9/17: HEP given 11/4: compliant at this time    Time  2     Period  Weeks    Status  Partially Met      PT SHORT TERM GOAL #2   Title  Patient will deny any falls over past 2 weeks to demonstrate improved safety awareness at home and work.     Baseline  9/17: progressive weakness 11/4: denies falls last 2 weeks    Time  2    Period  Weeks    Status  Achieved      PT SHORT TERM GOAL #3   Title  Patient will increase BLE gross strength to 4-/5 as to improve functional strength for independent gait, increased standing tolerance and increased ADL ability.    Baseline  9/17: Hip flex: L 3/5 R 3+/5 abd: 2+/5, 3-/5; add 2/5 2+/5 11/4: hip flexion L 3+/5 R 3+/5 abd 2+/5, 2+/5 add 2+/5, 2+/5    Time  2    Period  Weeks    Status  Partially Met        PT Long Term Goals - 08/12/18 1313      PT LONG TERM GOAL #1   Title  Patient will increase BLE gross strength to 4+/5 as to improve functional strength for independent gait, increased standing tolerance and increased ADL ability.    Baseline  9/17: Hip flex: L 3/5 R 3+/5 abd: 2+/5, 3-/5; add 2/5 2+/5 11/4: hip flexion L 3+/5 R 3+/5 abd 2+/5, 2+/5 add 2+/5, 2+/5    Time  8    Period  Weeks    Status  Partially Met      PT LONG TERM GOAL #2   Title  Patient will increase ABC scale score >80% to demonstrate better functional mobility and better confidence with ADLs.     Baseline  9/17: 68.13% 11/4: 76.6%    Time  8    Period  Weeks    Status  Partially Met      PT LONG TERM GOAL #3   Title  Patient will increase Berg Balance score by > 6  points (44/56)  to demonstrate decreased fall risk during functional activities.    Baseline  9/17: 38/56 11/4: 39/56 12/12: 43/56    Time  8    Period  Weeks    Status  Partially Met    Target Date  08/30/18      PT LONG TERM GOAL #4   Title  Patient will increase six minute walk test distance to >1000 for progression to community ambulator and improve gait ability    Baseline  9/18: 1300 ft w/o cane 11/4: 927f  12/12: 840 ft without cane    Time  8     Period  Weeks    Status  On-going    Target Date  08/30/18      PT LONG TERM GOAL #5   Title  Patient (> 896years old) will complete five times sit to stand test in < 15 seconds without UE support indicating an increased LE strength and improved balance.    Baseline  11/4: requires 1 UE support 12/12:  18 seconds no UE support    Time  8    Period  Weeks    Status  Partially Met            Plan - 09/28/18 1521    Clinical Impression Statement  Patient will be moving back home tomorrow so new HEP issued for performance with aide 5x/week. Patient demonstrates good ankle reactions when performing dynamic stability interventions on unstable surface. Patient will benefit from continued skilled therapeutic intervention to improve strength and balance for increased safety and improved overall QOL    Rehab Potential  Fair    Clinical Impairments Affecting Rehab Potential  (+) good support system, high prior level of function (-) currently undergoing radiation therapy for prostate cancer, memory deficits     PT Frequency  2x / week    PT Duration  8 weeks    PT Treatment/Interventions  ADLs/Self Care Home Management;Aquatic Therapy;Cryotherapy;Moist Heat;Traction;Ultrasound;Therapeutic activities;Functional mobility training;Stair training;Gait training;DME Instruction;Therapeutic exercise;Balance training;Neuromuscular re-education;Patient/family education;Manual techniques;Passive range of motion;Energy conservation;Vestibular;Taping    PT Next Visit Plan  strength, balance     PT Home Exercise Plan  see above    Consulted and Agree with Plan of Care  Patient       Patient will benefit from skilled therapeutic intervention in order to improve the following deficits and impairments:  Abnormal gait, Cardiopulmonary status limiting activity, Decreased activity tolerance, Decreased balance, Decreased knowledge of precautions, Decreased endurance, Decreased coordination, Decreased cognition,  Decreased knowledge of use of DME, Decreased mobility, Difficulty walking, Decreased safety awareness, Decreased strength, Impaired flexibility, Impaired perceived functional ability, Impaired sensation, Postural dysfunction, Improper body mechanics, Pain  Visit Diagnosis: Unsteadiness on feet  Muscle weakness (generalized)  Other abnormalities of gait and mobility  Difficulty in walking, not elsewhere classified     Problem List Patient Active Problem List   Diagnosis Date Noted  . Prostate cancer ([place] 02/25/2018  . Prostatic cyst 01/15/2018  . 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 ([place] 09/15/2017  . Blood in stool   . Abnormal feces   . Stricture and stenosis of esophagus   . Mild cognitive impairment 08/18/2017  . Senile purpura ([place] 04/29/2017  . Anemia 04/29/2017  . Frequent falls 04/29/2017  . Benign localized hyperplasia of prostate with urinary obstruction 07/15/2016  . Elevated PSA 07/15/2016  . Incomplete emptying of bladder 07/15/2016  . Urge incontinence 07/15/2016  . Hypothyroidism 04/25/2016  . Macular degeneration 04/25/2016  . Erectile dysfunction  due to arterial insufficiency   . Ischemic heart disease with chronotropic incompetence   . CN (constipation) 02/10/2015  . DD (diverticular disease) 02/10/2015  . Fatigue 02/10/2015  . Lichen planus 09/47/0962  . Restless leg 02/10/2015  . 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 B12 deficiency 02/10/2015  . COPD (chronic obstructive pulmonary disease) ([place]) 11/14/2014  . Post Inflammatory Lung Changes 11/14/2014  . PAF (paroxysmal atrial fibrillation) ([facility]) CHA2DS2-VASc = 4. AC = Eliquis   . Insomnia 12/09/2013  . OSA on CPAP   . Dyspnea on exertion - -essentially resolved with BB dose reduction & wgt loss 03/29/2013  . Overweight (BMI 25.0-29.9) -- Wgt back up 01/17/2013  . Atherosclerotic heart disease of native coronary artery without angina pectoris - s/p CABG, occluded SVG-D1; patent LIMA-LAD, SVG-OM, SVG-  RCA   . Essential hypertension   . Hyperlipidemia with target LDL less than 70   . Aorto-iliac disease ([place])   . BCC (basal cell carcinoma), eyelid 01/03/2013  . Allergic rhinitis 08/17/2009  . Adaptation reaction 05/28/2009  . Acid reflux 05/28/2009  . Arthritis, degenerative 05/28/2009  . 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 Carotid artery occlusion without infarction 06/01/2006  . PAD (peripheral artery disease) - bilateral common iliac stents 12/15/2005  . Aortoiliac occlusive disease ([place]) 11/30/2005  . Atherosclerotic heart disease of artery bypass graft 09/01/1997   Janna Arch, PT, DPT   09/28/2018, 3:24 PM  [place] MAIN [facility] SERVICES [address] [place], [place], [address] Phone: [phone]   Fax:  [phone]  Name: RYER ASATO MRN: [id number] Date of Birth: Jul 20, 1934

## 2018-09-29 ENCOUNTER — Ambulatory Visit
Admission: RE | Admit: 2018-09-29 | Discharge: 2018-09-29 | Disposition: A | Discharge status: Home / Self Care | Payer: Medicare Other | Source: Ambulatory Visit | Attending: Radiation Oncology | Admitting: Radiation Oncology

## 2018-09-29 ENCOUNTER — Encounter: Payer: Self-pay | Admitting: Radiation Oncology

## 2018-09-29 ENCOUNTER — Other Ambulatory Visit: Payer: Self-pay

## 2018-09-29 VITALS — BP 158/86 | HR 73 | Temp 98.0°F | Resp 18 | Wt 217.6 lb

## 2018-09-29 DIAGNOSIS — Z923 Personal history of irradiation: Secondary | ICD-10-CM | POA: Insufficient documentation

## 2018-09-29 DIAGNOSIS — K5900 Constipation, unspecified: Secondary | ICD-10-CM | POA: Insufficient documentation

## 2018-09-29 DIAGNOSIS — N393 Stress incontinence (female) (male): Secondary | ICD-10-CM | POA: Diagnosis not present

## 2018-09-29 DIAGNOSIS — C61 Malignant neoplasm of prostate: Secondary | ICD-10-CM | POA: Diagnosis not present

## 2018-09-29 NOTE — Progress Notes (Signed)
Radiation Oncology Follow up Note  Name: [name]   Date:   09/29/2018 MRN:  [id number] DOB: 1934-04-10    This 85 y.o. male presents to the clinic today for four-month follow-up status posttI MRT radiation therapy for Gleason 8 adenocarcinoma the prostate .  REFERRING PROVIDER: [name], MD  HPI: patient is in 85 year old male now seen out 4 months having completed IM RT radiation therapy to his prostate for Gleason 8 adenocarcinoma. He is seen today in routine follow-up is doing fairly well. He still has some problems with stress incontinence. His bowels tend towards more constipation and he does take fiber for that. He does wear depends undergarment although these problems preceded his radiation therapy. Patient was on Flomax has most recently been switched to mybetic.his most recent PSA was 0.10.  COMPLICATIONS OF TREATMENT: none  FOLLOW UP COMPLIANCE: keeps appointments   PHYSICAL EXAM:  BP (!) 158/86   Pulse 73   Temp 98 F (36.7 C)   Resp 18   Wt 217 lb 9.5 oz (98.7 kg)   BMI 29.51 kg/m  Well-developed well-nourished patient in NAD. HEENT reveals PERLA, EOMI, discs not visualized.  Oral cavity is clear. No oral mucosal lesions are identified. Neck is clear without evidence of cervical or supraclavicular adenopathy. Lungs are clear to A&P. Cardiac examination is essentially unremarkable with regular rate and rhythm without murmur rub or thrill. Abdomen is benign with no organomegaly or masses noted. Motor sensory and DTR levels are equal and symmetric in the upper and lower extremities. Cranial nerves II through XII are grossly intact. Proprioception is intact. No peripheral adenopathy or edema is identified. No motor or sensory levels are noted. Crude visual fields are within normal range.  RADIOLOGY RESULTS: no current films for review  PLAN: present time patient is doing fairly well has excellent biochemical control of his prostate cancer at this time.he  continues on Lupron suppression through urology is being followed for his urinary symptoms by Dr. Bernardo Heater. I've asked to see patient back in 6 months with a current PSA at that time. Patient wife know to call sooner with any concerns.  I would like to take this opportunity to thank you for allowing me to participate in the care of your patient.Noreene Filbert, MD

## 2018-09-30 ENCOUNTER — Ambulatory Visit: Payer: Medicare Other

## 2018-09-30 DIAGNOSIS — R2689 Other abnormalities of gait and mobility: Secondary | ICD-10-CM

## 2018-09-30 DIAGNOSIS — R262 Difficulty in walking, not elsewhere classified: Secondary | ICD-10-CM | POA: Diagnosis not present

## 2018-09-30 DIAGNOSIS — M6281 Muscle weakness (generalized): Secondary | ICD-10-CM | POA: Diagnosis not present

## 2018-09-30 DIAGNOSIS — R2681 Unsteadiness on feet: Secondary | ICD-10-CM | POA: Diagnosis not present

## 2018-09-30 NOTE — Therapy (Addendum)
[place] MAIN [facility] SERVICES [address] [place], [place], [address] Phone: [phone]   Fax:  [phone]  Physical Therapy Treatment/Reassessment  Patient Details  Name: [name] MRN: [id number] Date of Birth: Dec 07, 1933 No data recorded  Encounter Date: 09/30/2018  PT End of Session - 09/30/18 1432    Visit Number  30    Number of Visits  32    Date for PT Re-Evaluation  10/26/18    Authorization Type   9/10 starting 12/12    PT Start Time  1424    PT Stop Time  1504    PT Time Calculation (min)  40 min    Equipment Utilized During Treatment  Gait belt    Activity Tolerance  Patient tolerated treatment well;Patient limited by fatigue    Behavior During Therapy  WFL for tasks assessed/performed       Past Medical History:  Diagnosis Date  . Arthritis   . Atherosclerotic heart disease of native coronary artery without angina pectoris 1998   - s/p CABG, occluded SVG-D1; patent LIMA-LAD, SVG-OM, SVG- RCA  . Basal cell carcinoma of eyelid 01/03/2013   2019 - R side of Nose  . Benign neoplasm of ascending colon   . Benign neoplasm of descending colon   . Cancer [facility])    Skin cancer  . Colon polyps   . COPD (chronic obstructive pulmonary disease) ([place])   . GERD (gastroesophageal reflux disease)   . Hemorrhoid 02/10/2015  . History of hiatal hernia   . HOH (hard of hearing)   . Hypertension   . Memory loss   . Osteoporosis   . PAF (paroxysmal atrial fibrillation) ([place])   . Prostate disorder   . Sciatic pain    Chronic  . Sleep apnea    cpap  . Temporary cerebral vascular dysfunction 02/10/2015   Had negative work-up.  July, 2012.  No medication changes, had forgot them that day, got dehydrated.     Past Surgical History:  Procedure Laterality Date  . ANKLE SURGERY    . Arch aortogram and carotid aortogram  06/02/2006   Dr Oneida Alar did surgery  . BASAL CELL CARCINOMA EXCISION  04/2016   Dermatology  . CARDIAC  CATHETERIZATION  01/30/1998    (Dr. Linard Millers): Native LAD & mRCA CTO. 90% D1. Mod Cx. SVG-D1 CTO. SVG-RCA & SVG-OM along with LIMA-LAD patent;  LV NORMAL Fxn  . CAROTID ENDARTERECTOMY Left Oct. 29, 2007   Dr. Oneida Alar:  . CATARACT EXTRACTION W/PHACO Right 03/05/2017   Procedure: CATARACT EXTRACTION PHACO AND INTRAOCULAR LENS PLACEMENT (IOC);  Surgeon: Leandrew Koyanagi, MD;  Location: [facility] ORS;  Service: Ophthalmology;  Laterality: Right;  [place] 00:58.5 AP% 10.6 CDE 6.20 Fluid Pack lot # [id number]  . CATARACT EXTRACTION W/PHACO Left 04/15/2017   Procedure: CATARACT EXTRACTION PHACO AND INTRAOCULAR LENS PLACEMENT ([place])  Left;  Surgeon: Leandrew Koyanagi, MD;  Location: [place];  Service: Ophthalmology;  Laterality: Left;  . COLONOSCOPY WITH PROPOFOL N/A 09/15/2017   Procedure: COLONOSCOPY WITH PROPOFOL;  Surgeon: Lucilla Lame, MD;  Location: [facility] ENDOSCOPY;  Service: Endoscopy;  Laterality: N/A;  . CORONARY ARTERY BYPASS GRAFT  1998   LIMA-LAD, SVG-OM, SVG-RPDA, SVG-DIAG  . CPET / MET  12/2012   Mild chronotropic incompetence - read 82% of predicted; also reduced effort; peak VO2 15.7 / 75% (did not reach Max effort) -- suggested ischemic response in last 1.5 minutes of exercise. Normal pulmonary function on PFTs but poor response  to  . DOPPLER ECHOCARDIOGRAPHY  11/20/2010   EF =>55%; LV norm mild aortic scelorosis  . ESOPHAGOGASTRODUODENOSCOPY (EGD) WITH PROPOFOL N/A 09/15/2017   Procedure: ESOPHAGOGASTRODUODENOSCOPY (EGD) WITH PROPOFOL;  Surgeon: Lucilla Lame, MD;  Location: [facility] ENDOSCOPY;  Service: Endoscopy;  Laterality: N/A;  . FRACTURE SURGERY Right 03/19/2015   wrist  . HIATAL HERNIA REPAIR    . ILIAC ARTERY STENT  12/15/2005   PTA and direct stenting rgt and lft common iliac arteries  . NM GATED MYOVIEW ([place] HX)  12/2017   Low Risk - no ischemia or infarct.  EF > 65%. no RWMA  . NM MYOCAR PERF WALL MOTION  11/11/2010; February 2016   a) TM STRESS----NORMAL PERFUSION ,EF  68%; b) EF 50-55%. Normal LV function. No significant ischemia or infarction.  . ORIF ELBOW FRACTURE Right 01/01/2017   Procedure: OPEN REDUCTION INTERNAL FIXATION (ORIF) ELBOW/OLECRANON FRACTURE;  Surgeon: Hessie Knows, MD;  Location: [facility] ORS;  Service: Orthopedics;  Laterality: Right;  . ORIF WRIST FRACTURE Right 03/19/2015   Procedure: OPEN REDUCTION INTERNAL FIXATION (ORIF) WRIST FRACTURE;  Surgeon: Hessie Knows, MD;  Location: [facility] ORS;  Service: Orthopedics;  Laterality: Right;  . THORACIC AORTA - CAROTID ANGIOGRAM  October 2007   Dr. 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 TRANSTHORACIC ECHOCARDIOGRAM  February 2016   [facility]: Normal LV function. Dilated left atrium.    There were no vitals filed for this visit.  Subjective Assessment - 09/30/18 1431    Subjective  Pt in pain today in his central low back near L4/L5 junction. Reports it has been bothering him for a few days, but painful today. HEP is going well.     Pertinent History  Patient goes 5x/week for radiation therapy for prostate cancer. Is getting weaker and no longer goes to the senior center. Just started using cane, when walks a lot his low back and legs ache. He has a significant CV history including quadruple bypass, CAD w cardiac stents, on Eliquis, left carotid endarterectomy, AAA. He is functionally independent but lives alone, his partner lives 5 minutes down the road and seems to assist him with daily living. Activity as tolerated per patient report. Has history of memory deficits.         Intervention this date: -SKTC stretch 2x30sec bilat -Hooklying fig-4 stretch 2x30sec bilat  -DKTC rocking (repeated flexion in non weight bearing) 2x15x1sec gentle oscillation -Glute max bridge (lower spine erector activation)  -Reverse curl-up (double bent knee raise) 2x15   -MFR to Lumbar paraspinals/multifidus on the Right side x 5  minutes  -Standing Marching, LUE support 1x20 alternating pattern  -Standing Hip ABDCT BUE support: 1x10 bilat -Standing Hip extension BUE support: 1x10 bilat, verbal cues for straight knee  -Standing bilat heel raises BUE support 1x10 (very low excursion, difficulty obtaining height)  -STS, hands across chest 1x5 chair +airex, 1x5 chair+airex+2 towels; 1x10 chair +2 airex pads  -Airex balance, normal stance, self ball toss 1x15 (minGuardAssist to MinAssist)  -Airex balance, normal stance, wall ball rebound 1x10 (over door frame, height is challenging) (minGuardAssist to MinAssist)  -Airex narrow stance, ball self toss 1x15 (minGuardAssist to MinAssist)      PT Short Term Goals - 08/12/18 1313      PT SHORT TERM GOAL #1   Title  Patient will be independent in home exercise program to improve strength/mobility for better functional independence with ADLs.    Baseline  9/17:  HEP given 11/4: compliant at this time    Time  2    Period  Weeks    Status  Partially Met      PT SHORT TERM GOAL #2   Title  Patient will deny any falls over past 2 weeks to demonstrate improved safety awareness at home and work.     Baseline  9/17: progressive weakness 11/4: denies falls last 2 weeks    Time  2    Period  Weeks    Status  Achieved      PT SHORT TERM GOAL #3   Title  Patient will increase BLE gross strength to 4-/5 as to improve functional strength for independent gait, increased standing tolerance and increased ADL ability.    Baseline  9/17: Hip flex: L 3/5 R 3+/5 abd: 2+/5, 3-/5; add 2/5 2+/5 11/4: hip flexion L 3+/5 R 3+/5 abd 2+/5, 2+/5 add 2+/5, 2+/5    Time  2    Period  Weeks    Status  Partially Met        PT Long Term Goals - 08/12/18 1313      PT LONG TERM GOAL #1   Title  Patient will increase BLE gross strength to 4+/5 as to improve functional strength for independent gait, increased standing tolerance and increased ADL ability.    Baseline  9/17: Hip flex: L 3/5 R 3+/5  abd: 2+/5, 3-/5; add 2/5 2+/5 11/4: hip flexion L 3+/5 R 3+/5 abd 2+/5, 2+/5 add 2+/5, 2+/5    Time  8    Period  Weeks    Status  Partially Met      PT LONG TERM GOAL #2   Title  Patient will increase ABC scale score >80% to demonstrate better functional mobility and better confidence with ADLs.     Baseline  9/17: 68.13% 11/4: 76.6%    Time  8    Period  Weeks    Status  Partially Met      PT LONG TERM GOAL #3   Title  Patient will increase Berg Balance score by > 6 points (44/56)  to demonstrate decreased fall risk during functional activities.    Baseline  9/17: 38/56 11/4: 39/56 12/12: 43/56    Time  8    Period  Weeks    Status  Partially Met    Target Date  08/30/18      PT LONG TERM GOAL #4   Title  Patient will increase six minute walk test distance to >1000 for progression to community ambulator and improve gait ability    Baseline  9/18: 1300 ft w/o cane 11/4: 924f  12/12: 840 ft without cane    Time  8    Period  Weeks    Status  On-going    Target Date  08/30/18      PT LONG TERM GOAL #5   Title  Patient (> 619years old) will complete five times sit to stand test in < 15 seconds without UE support indicating an increased LE strength and improved balance.    Baseline  11/4: requires 1 UE support 12/12:  18 seconds no UE support    Time  8    Period  Weeks    Status  Partially Met            Plan - 09/30/18 1433    Clinical Impression Statement  Reassessment performed this date prior to proceeding with treatment session. Pt demonstrates continued  steady but gradual improvement in strength measures and dynamic balance activity, but also continues to demonstrate impairment to the point of limitations of safety and tolerance of ADL. Many short term and long term goals remain partially met. Pt will continue to benefit from skilled PT intervention to reducae falls risk and to increase level of independence in ADL. Continued with current program, working on  interventions to imporove dynamic balance and stability in gait. Pt c/o pain at begginning of session, hence his pain is addressed first with stretching and manual therapies. Pt then participates in mobility based program with improved tolerance. Pt continues to demosntrate mild bradkinesia and instability but does show conitnued improvement. Pain in back improves from a "5-6/10" to a "3-4/10". Exercises continue to demonstrate signicant weakness in hips, hence as patient improves in this area, anticipate improved performed with all other funcitonal activities as well.      Rehab Potential  Fair    Clinical Impairments Affecting Rehab Potential  (+) good support system, high prior level of function (-) currently undergoing radiation therapy for prostate cancer, memory deficits     PT Frequency  2x / week    PT Duration  8 weeks    PT Treatment/Interventions  ADLs/Self Care Home Management;Aquatic Therapy;Cryotherapy;Moist Heat;Traction;Ultrasound;Therapeutic activities;Functional mobility training;Stair training;Gait training;DME Instruction;Therapeutic exercise;Balance training;Neuromuscular re-education;Patient/family education;Manual techniques;Passive range of motion;Energy conservation;Vestibular;Taping    PT Next Visit Plan  strength, balance; address back pain as needed.      PT Home Exercise Plan  see above    Consulted and Agree with Plan of Care  Patient       Patient will benefit from skilled therapeutic intervention in order to improve the following deficits and impairments:  Abnormal gait, Cardiopulmonary status limiting activity, Decreased activity tolerance, Decreased balance, Decreased knowledge of precautions, Decreased endurance, Decreased coordination, Decreased cognition, Decreased knowledge of use of DME, Decreased mobility, Difficulty walking, Decreased safety awareness, Decreased strength, Impaired flexibility, Impaired perceived functional ability, Impaired sensation, Postural  dysfunction, Improper body mechanics, Pain  Visit Diagnosis: Unsteadiness on feet  Muscle weakness (generalized)  Other abnormalities of gait and mobility  Difficulty in walking, not elsewhere classified     Problem List Patient Active Problem List   Diagnosis Date Noted  . Prostate cancer ([place]) 02/25/2018  . Prostatic cyst 01/15/2018  . 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 ([place]) 09/15/2017  . Blood in stool   . Abnormal feces   . Stricture and stenosis of esophagus   . Mild cognitive impairment 08/18/2017  . Senile purpura ([place]) 04/29/2017  . Anemia 04/29/2017  . Frequent falls 04/29/2017  . Benign localized hyperplasia of prostate with urinary obstruction 07/15/2016  . Elevated PSA 07/15/2016  . Incomplete emptying of bladder 07/15/2016  . Urge incontinence 07/15/2016  . Hypothyroidism 04/25/2016  . Macular degeneration 04/25/2016  . Erectile dysfunction due to arterial insufficiency   . Ischemic heart disease with chronotropic incompetence   . CN (constipation) 02/10/2015  . DD (diverticular disease) 02/10/2015  . Fatigue 02/10/2015  . Lichen planus 54/49/2010  . Restless leg 02/10/2015  . 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 B12 deficiency 02/10/2015  . COPD (chronic obstructive pulmonary disease) ([place]) 11/14/2014  . Post Inflammatory Lung Changes 11/14/2014  . PAF (paroxysmal atrial fibrillation) ([facility]) CHA2DS2-VASc = 4. AC = Eliquis   . Insomnia 12/09/2013  . OSA on CPAP   . Dyspnea on exertion - -essentially resolved with BB dose reduction & wgt loss 03/29/2013  . Overweight (BMI 25.0-29.9) -- Wgt back up  01/17/2013  . Atherosclerotic heart disease of native coronary artery without angina pectoris - s/p CABG, occluded SVG-D1; patent LIMA-LAD, SVG-OM, SVG- RCA   . Essential hypertension   . Hyperlipidemia with target LDL less than 70   . Aorto-iliac disease ([place])   . BCC (basal cell carcinoma), eyelid 01/03/2013  . Allergic rhinitis 08/17/2009  . Adaptation  reaction 05/28/2009  . Acid reflux 05/28/2009  . Arthritis, degenerative 05/28/2009  . 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 Carotid artery occlusion without infarction 06/01/2006  . PAD (peripheral artery disease) - bilateral common iliac stents 12/15/2005  . Aortoiliac occlusive disease ([place]) 11/30/2005  . Atherosclerotic heart disease of artery bypass graft 09/01/1997  2:58 PM, 09/30/18 Etta Grandchild, PT, DPT Physical Therapist - [facility]  Outpatient Physical Therapy- [place] [phone]      Etta Grandchild 09/30/2018, 2:38 PM  [place] MAIN [facility] SERVICES [address] [place], [place], [address] Phone: [phone]   Fax:  [phone]  Name: [name] MRN: [id number] Date of Birth: 1934/03/24   *addendum on 10/07/18 to update title of document to include the word 'reassessment'.  10:11 AM, 10/07/18 Etta Grandchild, PT, DPT Physical Therapist - [place] [phone]

## 2018-10-01 ENCOUNTER — Ambulatory Visit: Payer: Medicare Other

## 2018-10-04 ENCOUNTER — Other Ambulatory Visit: Payer: Self-pay

## 2018-10-04 ENCOUNTER — Ambulatory Visit (INDEPENDENT_AMBULATORY_CARE_PROVIDER_SITE_OTHER): Payer: Medicare Other | Admitting: Gastroenterology

## 2018-10-04 ENCOUNTER — Encounter: Payer: Self-pay | Admitting: Gastroenterology

## 2018-10-04 VITALS — BP 130/76 | HR 91 | Ht 72.0 in | Wt 216.4 lb

## 2018-10-04 DIAGNOSIS — C186 Malignant neoplasm of descending colon: Secondary | ICD-10-CM

## 2018-10-04 NOTE — H&P (View-Only) (Signed)
Primary Care Physician: [name], MD  Primary Gastroenterologist:  Dr. Lucilla Lame  No chief complaint on file.   HPI: [name] is a 85 y.o. male here For follow-up after having a colonoscopy.  The patient multiple polyps throughout the colon and one of the polyps in the descending/sigmoid colon showed "ADENOCARCINOMA ARISING IN A DYSPLASTIC SESSILE SERRATED ADENOMA, WITH LYMPHOVASCULAR INVASION". This was reported to be consistent with a T1 lesion.  The patient is being treated now for adenocarcinoma of the prostate also. The patient has previously been told the results verbally when he came with his wife for an appointment.  He now comes to discuss his options today. The patient had not followed up prior to today because he states that he wanted to take care of his prostate cancer before pursuing any further workup or surgeries for his colon.  The patient reports that he has been having some intermittent constipation and diarrhea since having his treatment for his prostate cancer.  Current Outpatient Medications  Medication Sig Dispense Refill  . acetaminophen (TYLENOL) 500 MG tablet Take 500-1,000 mg by mouth every 6 (six) hours as needed (for pain.).    [name] ADVAIR DISKUS 250-50 MCG/DOSE AEPB INHALE 1 PUFF TWICE A DAY AS DIRECTED **RINSE MOUTH AFTER USE** 60 each 5  . apixaban (ELIQUIS) 5 MG TABS tablet Take 1 tablet (5 mg total) by mouth 2 (two) times daily. 180 tablet 2  . Ascorbic Acid (VITAMIN C) 1000 MG tablet Take 1,000 mg by mouth daily.    . cetirizine (ZYRTEC) 10 MG tablet Take 10 mg by mouth daily.    . Cholecalciferol (VITAMIN D-3 PO) Take 2,000 Units by mouth every evening.     . Cyanocobalamin (VITAMIN B-12) 500 MCG SUBL Place 500 mcg under the tongue daily.     [name] DEXILANT 60 MG capsule TAKE 1 CAPSULE EVERY DAY 30 capsule 11  . donepezil (ARICEPT) 10 MG tablet Take 10 mg by mouth at bedtime.     [name] ezetimibe (ZETIA) 10 MG tablet TAKE 1 TABLET BY MOUTH DAILY 30  tablet 5  . finasteride (PROSCAR) 5 MG tablet Take 5 mg by mouth daily.    . fluticasone (FLONASE) 50 MCG/ACT nasal spray Place 2 sprays into both nostrils daily. (Patient taking differently: Place 2 sprays into both nostrils daily as needed for allergies. ) 16 g 6  . lisinopril (PRINIVIL,ZESTRIL) 2.5 MG tablet TAKE ONE TABLET BY MOUTH TWICE DAILY 180 tablet 4  . MAGNESIUM OXIDE PO Take 500 mg by mouth every evening.     . metoprolol tartrate (LOPRESSOR) 25 MG tablet TAKE ONE TABLET BY MOUTH TWICE DAILY 180 tablet 1  . mirabegron ER (MYRBETRIQ) 50 MG TB24 tablet Take 1 tablet (50 mg total) by mouth daily. 30 tablet 0  . Multiple Vitamins-Minerals (PRESERVISION AREDS 2 PO) Take 1 tablet by mouth 2 (two) times daily.     . nitroGLYCERIN (NITROSTAT) 0.4 MG SL tablet Place 1 tablet (0.4 mg total) under the tongue every 5 (five) minutes as needed for chest pain. 20 tablet 1  . NON FORMULARY Swish and spit 1 Dose at bedtime. Nystatin elixir month wash     . nortriptyline (PAMELOR) 10 MG capsule Take 10 mg by mouth at bedtime.     [name] nystatin (MYCOSTATIN) 100000 UNIT/ML suspension Use as directed 5 mLs in the mouth or throat daily as needed (irritation).     . rosuvastatin (CRESTOR) 20 MG tablet TAKE ONE TABLET  EVERY DAY 90 tablet 4  . tamsulosin (FLOMAX) 0.4 MG CAPS capsule Take 0.4 mg by mouth daily.     No current facility-administered medications for this visit.     Allergies as of 10/04/2018 - Review Complete 09/29/2018  Allergen Reaction Noted  . Clonazepam Other (See Comments) 11/16/2014  . Codeine Other (See Comments) 01/13/2013  . Niacin and related Other (See Comments) 01/13/2013  . Tramadol  12/22/2016  . Hydrocodone  12/26/2016  . Oxycodone Other (See Comments) 12/26/2016    ROS:  General: Negative for anorexia, weight loss, fever, chills, fatigue, weakness. ENT: Negative for hoarseness, difficulty swallowing , nasal congestion. CV: Negative for chest pain, angina, palpitations,  dyspnea on exertion, peripheral edema.  Respiratory: Negative for dyspnea at rest, dyspnea on exertion, cough, sputum, wheezing.  GI: See history of present illness. GU:  Negative for dysuria, hematuria, urinary incontinence, urinary frequency, nocturnal urination.  Endo: Negative for unusual weight change.    Physical Examination:   There were no vitals taken for this visit.  General: Well-nourished, well-developed in no acute distress.  Eyes: No icterus. Conjunctivae pink. Mouth: Oropharyngeal mucosa moist and pink , no lesions erythema or exudate. Lungs: Clear to auscultation bilaterally. Non-labored. Heart: Regular rate and rhythm, no murmurs rubs or gallops.  Abdomen: Bowel sounds are normal, nontender, nondistended, no hepatosplenomegaly or masses, no abdominal bruits or hernia , no rebound or guarding.   Extremities: No lower extremity edema. No clubbing or deformities. Neuro: Alert and oriented x 3.  Grossly intact. Skin: Warm and dry, no jaundice.   Psych: Alert and cooperative, normal mood and affect.  Labs:    Imaging Studies: No results found.  Assessment and Plan:   [name] is a 85 y.o. y/o male who comes in today with a history of prostate cancer and a polyp in the descending/sigmoid colon area that showed adenocarcinoma. This was a year ago and the patient is now ready to follow-up on these pathology findings.  The patient will be set up for colonoscopy to see if the area of the previously seen polyp/adenocarcinoma can be identified and tattooed versus local excision.I have discussed risks & benefits which include, but are not limited to, bleeding, infection, perforation & drug reaction.  The patient agrees with this plan & written consent will be obtained.       Lucilla Lame, MD. [place] [place]   Note: This dictation was prepared with Dragon dictation along with smaller phrase technology. Any transcriptional errors that result from this process are  unintentional.

## 2018-10-04 NOTE — Progress Notes (Signed)
Primary Care Physician: [name], MD  Primary Gastroenterologist:  Dr. Lucilla Lame  No chief complaint on file.   HPI: [name] is a 85 y.o. male here For follow-up after having a colonoscopy.  The patient multiple polyps throughout the colon and one of the polyps in the descending/sigmoid colon showed "ADENOCARCINOMA ARISING IN A DYSPLASTIC SESSILE SERRATED ADENOMA, WITH LYMPHOVASCULAR INVASION". This was reported to be consistent with a T1 lesion.  The patient is being treated now for adenocarcinoma of the prostate also. The patient has previously been told the results verbally when he came with his wife for an appointment.  He now comes to discuss his options today. The patient had not followed up prior to today because he states that he wanted to take care of his prostate cancer before pursuing any further workup or surgeries for his colon.  The patient reports that he has been having some intermittent constipation and diarrhea since having his treatment for his prostate cancer.  Current Outpatient Medications  Medication Sig Dispense Refill  . acetaminophen (TYLENOL) 500 MG tablet Take 500-1,000 mg by mouth every 6 (six) hours as needed (for pain.).    [name] ADVAIR DISKUS 250-50 MCG/DOSE AEPB INHALE 1 PUFF TWICE A DAY AS DIRECTED **RINSE MOUTH AFTER USE** 60 each 5  . apixaban (ELIQUIS) 5 MG TABS tablet Take 1 tablet (5 mg total) by mouth 2 (two) times daily. 180 tablet 2  . Ascorbic Acid (VITAMIN C) 1000 MG tablet Take 1,000 mg by mouth daily.    . cetirizine (ZYRTEC) 10 MG tablet Take 10 mg by mouth daily.    . Cholecalciferol (VITAMIN D-3 PO) Take 2,000 Units by mouth every evening.     . Cyanocobalamin (VITAMIN B-12) 500 MCG SUBL Place 500 mcg under the tongue daily.     [name] DEXILANT 60 MG capsule TAKE 1 CAPSULE EVERY DAY 30 capsule 11  . donepezil (ARICEPT) 10 MG tablet Take 10 mg by mouth at bedtime.     [name] ezetimibe (ZETIA) 10 MG tablet TAKE 1 TABLET BY MOUTH DAILY 30  tablet 5  . finasteride (PROSCAR) 5 MG tablet Take 5 mg by mouth daily.    . fluticasone (FLONASE) 50 MCG/ACT nasal spray Place 2 sprays into both nostrils daily. (Patient taking differently: Place 2 sprays into both nostrils daily as needed for allergies. ) 16 g 6  . lisinopril (PRINIVIL,ZESTRIL) 2.5 MG tablet TAKE ONE TABLET BY MOUTH TWICE DAILY 180 tablet 4  . MAGNESIUM OXIDE PO Take 500 mg by mouth every evening.     . metoprolol tartrate (LOPRESSOR) 25 MG tablet TAKE ONE TABLET BY MOUTH TWICE DAILY 180 tablet 1  . mirabegron ER (MYRBETRIQ) 50 MG TB24 tablet Take 1 tablet (50 mg total) by mouth daily. 30 tablet 0  . Multiple Vitamins-Minerals (PRESERVISION AREDS 2 PO) Take 1 tablet by mouth 2 (two) times daily.     . nitroGLYCERIN (NITROSTAT) 0.4 MG SL tablet Place 1 tablet (0.4 mg total) under the tongue every 5 (five) minutes as needed for chest pain. 20 tablet 1  . NON FORMULARY Swish and spit 1 Dose at bedtime. Nystatin elixir month wash     . nortriptyline (PAMELOR) 10 MG capsule Take 10 mg by mouth at bedtime.     [name] nystatin (MYCOSTATIN) 100000 UNIT/ML suspension Use as directed 5 mLs in the mouth or throat daily as needed (irritation).     . rosuvastatin (CRESTOR) 20 MG tablet TAKE ONE TABLET  EVERY DAY 90 tablet 4  . tamsulosin (FLOMAX) 0.4 MG CAPS capsule Take 0.4 mg by mouth daily.     No current facility-administered medications for this visit.     Allergies as of 10/04/2018 - Review Complete 09/29/2018  Allergen Reaction Noted  . Clonazepam Other (See Comments) 11/16/2014  . Codeine Other (See Comments) 01/13/2013  . Niacin and related Other (See Comments) 01/13/2013  . Tramadol  12/22/2016  . Hydrocodone  12/26/2016  . Oxycodone Other (See Comments) 12/26/2016    ROS:  General: Negative for anorexia, weight loss, fever, chills, fatigue, weakness. ENT: Negative for hoarseness, difficulty swallowing , nasal congestion. CV: Negative for chest pain, angina, palpitations,  dyspnea on exertion, peripheral edema.  Respiratory: Negative for dyspnea at rest, dyspnea on exertion, cough, sputum, wheezing.  GI: See history of present illness. GU:  Negative for dysuria, hematuria, urinary incontinence, urinary frequency, nocturnal urination.  Endo: Negative for unusual weight change.    Physical Examination:   There were no vitals taken for this visit.  General: Well-nourished, well-developed in no acute distress.  Eyes: No icterus. Conjunctivae pink. Mouth: Oropharyngeal mucosa moist and pink , no lesions erythema or exudate. Lungs: Clear to auscultation bilaterally. Non-labored. Heart: Regular rate and rhythm, no murmurs rubs or gallops.  Abdomen: Bowel sounds are normal, nontender, nondistended, no hepatosplenomegaly or masses, no abdominal bruits or hernia , no rebound or guarding.   Extremities: No lower extremity edema. No clubbing or deformities. Neuro: Alert and oriented x 3.  Grossly intact. Skin: Warm and dry, no jaundice.   Psych: Alert and cooperative, normal mood and affect.  Labs:    Imaging Studies: No results found.  Assessment and Plan:   [name] is a 85 y.o. y/o male who comes in today with a history of prostate cancer and a polyp in the descending/sigmoid colon area that showed adenocarcinoma. This was a year ago and the patient is now ready to follow-up on these pathology findings.  The patient will be set up for colonoscopy to see if the area of the previously seen polyp/adenocarcinoma can be identified and tattooed versus local excision.I have discussed risks & benefits which include, but are not limited to, bleeding, infection, perforation & drug reaction.  The patient agrees with this plan & written consent will be obtained.       Lucilla Lame, MD. Marval Regal   Note: This dictation was prepared with Dragon dictation along with smaller phrase technology. Any transcriptional errors that result from this process are  unintentional.

## 2018-10-05 ENCOUNTER — Other Ambulatory Visit: Payer: Self-pay

## 2018-10-05 ENCOUNTER — Ambulatory Visit: Payer: Medicare Other | Attending: Radiation Oncology

## 2018-10-05 DIAGNOSIS — R2681 Unsteadiness on feet: Secondary | ICD-10-CM | POA: Insufficient documentation

## 2018-10-05 DIAGNOSIS — R262 Difficulty in walking, not elsewhere classified: Secondary | ICD-10-CM | POA: Insufficient documentation

## 2018-10-05 DIAGNOSIS — R2689 Other abnormalities of gait and mobility: Secondary | ICD-10-CM

## 2018-10-05 DIAGNOSIS — C186 Malignant neoplasm of descending colon: Secondary | ICD-10-CM

## 2018-10-05 DIAGNOSIS — M6281 Muscle weakness (generalized): Secondary | ICD-10-CM | POA: Diagnosis not present

## 2018-10-05 NOTE — Therapy (Signed)
[place] MAIN [facility] SERVICES [address] [place], [place], [address] Phone: [phone]   Fax:  [phone]  Physical Therapy Treatment/ [facility]    Patient Details  Name: [name] MRN: [id number] Date of Birth: [birth year] No data recorded  Encounter Date: 10/05/2018  PT End of Session - 10/05/18 1408    Visit Number  31    Number of Visits  35    Date for PT Re-Evaluation  11/02/18    Authorization Type  1/10    PT Start Time  1345    PT Stop Time  1430    PT Time Calculation (min)  45 min    Equipment Utilized During Treatment  Gait belt    Activity Tolerance  Patient tolerated treatment well    Behavior During Therapy  WFL for tasks assessed/performed       Past Medical History:  Diagnosis Date  . Arthritis   . Atherosclerotic heart disease of native coronary artery without angina pectoris 1998   - s/p CABG, occluded SVG-D1; patent LIMA-LAD, SVG-OM, SVG- RCA  . Basal cell carcinoma of eyelid 01/03/2013   2019 - R side of Nose  . Benign neoplasm of ascending colon   . Benign neoplasm of descending colon   . Cancer [facility])    Skin cancer  . Colon polyps   . COPD (chronic obstructive pulmonary disease) ([place])   . GERD (gastroesophageal reflux disease)   . Hemorrhoid 02/10/2015  . History of hiatal hernia   . HOH (hard of hearing)   . Hypertension   . Memory loss   . Osteoporosis   . PAF (paroxysmal atrial fibrillation) ([place])   . Prostate disorder   . Sciatic pain    Chronic  . Sleep apnea    cpap  . Temporary cerebral vascular dysfunction 02/10/2015   Had negative work-up.  July, 2012.  No medication changes, had forgot them that day, got dehydrated.     Past Surgical History:  Procedure Laterality Date  . ANKLE SURGERY    . Arch aortogram and carotid aortogram  06/02/2006   Dr Oneida Alar did surgery  . BASAL CELL CARCINOMA EXCISION  04/2016   Dermatology  . CARDIAC CATHETERIZATION  01/30/1998    (Dr. Linard Millers):  Native LAD & mRCA CTO. 90% D1. Mod Cx. SVG-D1 CTO. SVG-RCA & SVG-OM along with LIMA-LAD patent;  LV NORMAL Fxn  . CAROTID ENDARTERECTOMY Left Oct. 29, 2007   Dr. Oneida Alar:  . CATARACT EXTRACTION W/PHACO Right 03/05/2017   Procedure: CATARACT EXTRACTION PHACO AND INTRAOCULAR LENS PLACEMENT (IOC);  Surgeon: Leandrew Koyanagi, MD;  Location: [facility] ORS;  Service: Ophthalmology;  Laterality: Right;  [place] 00:58.5 AP% 10.6 CDE 6.20 Fluid Pack lot # [id number]  . CATARACT EXTRACTION W/PHACO Left 04/15/2017   Procedure: CATARACT EXTRACTION PHACO AND INTRAOCULAR LENS PLACEMENT ([place])  Left;  Surgeon: Leandrew Koyanagi, MD;  Location: [place];  Service: Ophthalmology;  Laterality: Left;  . COLONOSCOPY WITH PROPOFOL N/A 09/15/2017   Procedure: COLONOSCOPY WITH PROPOFOL;  Surgeon: Lucilla Lame, MD;  Location: [facility] ENDOSCOPY;  Service: Endoscopy;  Laterality: N/A;  . CORONARY ARTERY BYPASS GRAFT  1998   LIMA-LAD, SVG-OM, SVG-RPDA, SVG-DIAG  . CPET / MET  12/2012   Mild chronotropic incompetence - read 82% of predicted; also reduced effort; peak VO2 15.7 / 75% (did not reach Max effort) -- suggested ischemic response in last 1.5 minutes of exercise. Normal pulmonary function on PFTs but poor response to  .  DOPPLER ECHOCARDIOGRAPHY  11/20/2010   EF =>55%; LV norm mild aortic scelorosis  . ESOPHAGOGASTRODUODENOSCOPY (EGD) WITH PROPOFOL N/A 09/15/2017   Procedure: ESOPHAGOGASTRODUODENOSCOPY (EGD) WITH PROPOFOL;  Surgeon: Lucilla Lame, MD;  Location: [facility] ENDOSCOPY;  Service: Endoscopy;  Laterality: N/A;  . FRACTURE SURGERY Right 03/19/2015   wrist  . HIATAL HERNIA REPAIR    . ILIAC ARTERY STENT  12/15/2005   PTA and direct stenting rgt and lft common iliac arteries  . NM GATED MYOVIEW ([place] HX)  12/2017   Low Risk - no ischemia or infarct.  EF > 65%. no RWMA  . NM MYOCAR PERF WALL MOTION  11/11/2010; February 2016   a) TM STRESS----NORMAL PERFUSION ,EF 68%; b) EF 50-55%. Normal LV function. No  significant ischemia or infarction.  . ORIF ELBOW FRACTURE Right 01/01/2017   Procedure: OPEN REDUCTION INTERNAL FIXATION (ORIF) ELBOW/OLECRANON FRACTURE;  Surgeon: Hessie Knows, MD;  Location: [facility] ORS;  Service: Orthopedics;  Laterality: Right;  . ORIF WRIST FRACTURE Right 03/19/2015   Procedure: OPEN REDUCTION INTERNAL FIXATION (ORIF) WRIST FRACTURE;  Surgeon: Hessie Knows, MD;  Location: [facility] ORS;  Service: Orthopedics;  Laterality: Right;  . THORACIC AORTA - CAROTID ANGIOGRAM  October 2007   Dr. 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 TRANSTHORACIC ECHOCARDIOGRAM  February 2016   [facility]: Normal LV function. Dilated left atrium.    There were no vitals filed for this visit.  Subjective Assessment - 10/05/18 1355    Subjective  Patient reports he went to the gym yesterday with his aide and did all his exercises 22 x. Patient received news yesterday that he had polyps that are potentially cancerous and will need to potentially get further testing. No falls or LOB since last session.     Pertinent History  Patient goes 5x/week for radiation therapy for prostate cancer. Is getting weaker and no longer goes to the senior center. Just started using cane, when walks a lot his low back and legs ache. He has a significant CV history including quadruple bypass, CAD w cardiac stents, on Eliquis, left carotid endarterectomy, AAA. He is functionally independent but lives alone, his partner lives 5 minutes down the road and seems to assist him with daily living. Activity as tolerated per patient report. Has history of memory deficits.     Limitations  Walking;Standing;House hold activities;Other (comment);Lifting    How long can you sit comfortably?  n/a    How long can you stand comfortably?  Reports that he has not used his cane in about 1 month    How long can you walk comfortably?  200-300 yards    Patient Stated Goals   Pt would still like to work on his balance    Currently in Pain?  No/denies      Patient reports he went to the gym yesterday with his aide and did all his exercises 22 x. Patient received news yesterday that he had polyps that are potentially cancerous and will need to potentially get further testing. No falls or LOB since last session.    Progress note/ [facility] -decrease to 1x/week  BLE strength  ABC: 82.5%  BERG =49/56 10 MWT =9 seconds  5x STS: SUE 15 seconds; no UE support 17 seconds! 6 MWT= 670 with one rest break and construction in way of patient  [facility] PT Assessment - 10/05/18 0001      Berg Balance Test   Sit to Stand  Able to stand without using hands and stabilize independently    Standing Unsupported  Able to stand safely 2 minutes    Sitting with Back Unsupported but Feet Supported on Floor or Stool  Able to sit safely and securely 2 minutes    Stand to Sit  Sits safely with minimal use of hands    Transfers  Able to transfer safely, minor use of hands    Standing Unsupported with Eyes Closed  Able to stand 10 seconds safely    Standing Ubsupported with Feet Together  Able to place feet together independently and stand for 1 minute with supervision    From Standing, Reach Forward with Outstretched Arm  Can reach forward >12 cm safely (5")    From Standing Position, Pick up Object from Floor  Able to pick up shoe, needs supervision    From Standing Position, Turn to Look Behind Over each Shoulder  Looks behind from both sides and weight shifts well    Turn 360 Degrees  Able to turn 360 degrees safely in 4 seconds or less    Standing Unsupported, Alternately Place Feet on Step/Stool  Able to stand independently and complete 8 steps >20 seconds    Standing Unsupported, One Foot in Front  Able to plae foot ahead of the other independently and hold 30 seconds    Standing on One Leg  Able to lift leg independently and hold equal to or more than 3 seconds    Total Score  49                                PT Education - 10/05/18 1357    Education provided  Yes    Education Details  goals, POC,     Person(s) Educated  Patient    Methods  Explanation;Demonstration;Tactile cues;Verbal cues    Comprehension  Verbalized understanding;Returned demonstration;Verbal cues required;Tactile cues required       PT Short Term Goals - 10/05/18 1406      PT SHORT TERM GOAL #1   Title  Patient will be independent in home exercise program to improve strength/mobility for better functional independence with ADLs.    Baseline  9/17: HEP given 11/4: compliant at this time 2/4: compliant    Time  2    Period  Weeks    Status  Achieved      PT SHORT TERM GOAL #2   Title  Patient will deny any falls over past 2 weeks to demonstrate improved safety awareness at home and work.     Baseline  9/17: progressive weakness 11/4: denies falls last 2 weeks    Time  2    Period  Weeks    Status  Achieved      PT SHORT TERM GOAL #3   Title  Patient will increase BLE gross strength to 4-/5 as to improve functional strength for independent gait, increased standing tolerance and increased ADL ability.    Baseline  9/17: Hip flex: L 3/5 R 3+/5 abd: 2+/5, 3-/5; add 2/5 2+/5 11/4: hip flexion L 3+/5 R 3+/5 abd 2+/5, 2+/5 add 2+/5, 2+/5 2/4: 4-/5     Time  2    Period  Weeks    Status  Achieved        PT Long Term Goals - 10/05/18 1404      PT LONG TERM GOAL #1   Title  Patient will increase  BLE gross strength to 4+/5 as to improve functional strength for independent gait, increased standing tolerance and increased ADL ability.    Baseline  9/17: Hip flex: L 3/5 R 3+/5 abd: 2+/5, 3-/5; add 2/5 2+/5 11/4: hip flexion L 3+/5 R 3+/5 abd 2+/5, 2+/5 add 2+/5, 2+/5 2/4: 4-/5    Time  4    Period  Weeks    Status  Partially Met    Target Date  11/02/18      PT LONG TERM GOAL #2   Title  Patient will increase ABC scale score >80% to demonstrate better functional  mobility and better confidence with ADLs.     Baseline  9/17: 68.13% 11/4: 76.6% 2/4: 82.5%     Time  8    Period  Weeks    Status  Achieved      PT LONG TERM GOAL #3   Title  Patient will increase Berg Balance score by > 6 points (44/56)  to demonstrate decreased fall risk during functional activities.    Baseline  9/17: 38/56 11/4: 39/56 12/12: 43/56    Time  4    Period  Weeks    Status  Partially Met    Target Date  11/02/18      PT LONG TERM GOAL #4   Title  Patient will increase six minute walk test distance to >1000 for progression to community ambulator and improve gait ability    Baseline  9/18: 1300 ft w/o cane 11/4: 932f  12/12: 840 ft without cane 2/4: 870 ft without cane     Time  4    Period  Weeks    Status  On-going    Target Date  11/02/18      PT LONG TERM GOAL #5   Title  Patient (> 68years old) will complete five times sit to stand test in < 15 seconds without UE support indicating an increased LE strength and improved balance.    Baseline  11/4: requires 1 UE support 12/12:  18 seconds no UE support 2/4: 17 seconds no UE support     Time  4    Period  Weeks    Status  Partially Met    Target Date  11/02/18            Plan - 10/05/18 1408    Clinical Impression Statement  Patient agreeable to decrease POC to 1x/week with him going to the gym with his aide and performing HEP given last week. Patient has demonstrated significant progress towards goals at this time and is nearing end of therapy, however due to instability and continued need for LE strength will benefit from one month of one time a week for safety and improved functional mobility. Patient's condition has the potential to improve in response to therapy. Maximum improvement is yet to be obtained. The anticipated improvement is attainable and reasonable in a generally predictable time.Patient will benefit from continued skilled therapeutic intervention to improve strength and balance for increased  safety and improved overall QOL    Rehab Potential  Fair    Clinical Impairments Affecting Rehab Potential  (+) good support system, high prior level of function (-) currently undergoing radiation therapy for prostate cancer, memory deficits     PT Frequency  1x / week    PT Duration  4 weeks    PT Treatment/Interventions  ADLs/Self Care Home Management;Aquatic Therapy;Cryotherapy;Moist Heat;Traction;Ultrasound;Therapeutic activities;Functional mobility training;Stair training;Gait training;DME Instruction;Therapeutic exercise;Balance training;Neuromuscular re-education;Patient/family education;Manual techniques;Passive range  of motion;Energy conservation;Vestibular;Taping    PT Next Visit Plan  strength, balance; address back pain as needed.      PT Home Exercise Plan  see above    Consulted and Agree with Plan of Care  Patient       Patient will benefit from skilled therapeutic intervention in order to improve the following deficits and impairments:  Abnormal gait, Cardiopulmonary status limiting activity, Decreased activity tolerance, Decreased balance, Decreased knowledge of precautions, Decreased endurance, Decreased coordination, Decreased cognition, Decreased knowledge of use of DME, Decreased mobility, Difficulty walking, Decreased safety awareness, Decreased strength, Impaired flexibility, Impaired perceived functional ability, Impaired sensation, Postural dysfunction, Improper body mechanics, Pain  Visit Diagnosis: Unsteadiness on feet  Muscle weakness (generalized)  Other abnormalities of gait and mobility     Problem List Patient Active Problem List   Diagnosis Date Noted  . Prostate cancer ([place]) 02/25/2018  . Prostatic cyst 01/15/2018  . 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 ([place]) 09/15/2017  . Blood in stool   . Abnormal feces   . Stricture and stenosis of esophagus   . Mild cognitive impairment 08/18/2017  . Senile purpura ([place]) 04/29/2017  . Anemia 04/29/2017  . Frequent  falls 04/29/2017  . Benign localized hyperplasia of prostate with urinary obstruction 07/15/2016  . Elevated PSA 07/15/2016  . Incomplete emptying of bladder 07/15/2016  . Urge incontinence 07/15/2016  . Hypothyroidism 04/25/2016  . Macular degeneration 04/25/2016  . Erectile dysfunction due to arterial insufficiency   . Ischemic heart disease with chronotropic incompetence   . CN (constipation) 02/10/2015  . DD (diverticular disease) 02/10/2015  . Fatigue 02/10/2015  . Lichen planus 06/77/0340  . Restless leg 02/10/2015  . 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 B12 deficiency 02/10/2015  . COPD (chronic obstructive pulmonary disease) ([place]) 11/14/2014  . Post Inflammatory Lung Changes 11/14/2014  . PAF (paroxysmal atrial fibrillation) ([facility]) CHA2DS2-VASc = 4. AC = Eliquis   . Insomnia 12/09/2013  . OSA on CPAP   . Dyspnea on exertion - -essentially resolved with BB dose reduction & wgt loss 03/29/2013  . Overweight (BMI 25.0-29.9) -- Wgt back up 01/17/2013  . Atherosclerotic heart disease of native coronary artery without angina pectoris - s/p CABG, occluded SVG-D1; patent LIMA-LAD, SVG-OM, SVG- RCA   . Essential hypertension   . Hyperlipidemia with target LDL less than 70   . Aorto-iliac disease ([place])   . BCC (basal cell carcinoma), eyelid 01/03/2013  . Allergic rhinitis 08/17/2009  . Adaptation reaction 05/28/2009  . Acid reflux 05/28/2009  . Arthritis, degenerative 05/28/2009  . 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 Carotid artery occlusion without infarction 06/01/2006  . PAD (peripheral artery disease) - bilateral common iliac stents 12/15/2005  . Aortoiliac occlusive disease ([place]) 11/30/2005  . Atherosclerotic heart disease of artery bypass graft 09/01/1997   Janna Arch, PT, DPT   10/05/2018, 3:27 PM  [place] MAIN [facility] SERVICES [address] [place], [place], [address] Phone: [phone]   Fax:   [phone]  Name: [name] MRN: [id number] Date of Birth: 1933/09/13

## 2018-10-06 ENCOUNTER — Ambulatory Visit (INDEPENDENT_AMBULATORY_CARE_PROVIDER_SITE_OTHER): Payer: Medicare Other | Admitting: Cardiology

## 2018-10-06 ENCOUNTER — Telehealth: Payer: Self-pay

## 2018-10-06 ENCOUNTER — Encounter: Payer: Self-pay | Admitting: Cardiology

## 2018-10-06 VITALS — BP 148/70 | HR 58 | Ht 72.0 in | Wt 218.8 lb

## 2018-10-06 DIAGNOSIS — I2589 Other forms of chronic ischemic heart disease: Secondary | ICD-10-CM | POA: Diagnosis not present

## 2018-10-06 DIAGNOSIS — I2510 Atherosclerotic heart disease of native coronary artery without angina pectoris: Secondary | ICD-10-CM

## 2018-10-06 DIAGNOSIS — I779 Disorder of arteries and arterioles, unspecified: Secondary | ICD-10-CM

## 2018-10-06 DIAGNOSIS — E785 Hyperlipidemia, unspecified: Secondary | ICD-10-CM

## 2018-10-06 DIAGNOSIS — I480 Paroxysmal atrial fibrillation: Secondary | ICD-10-CM

## 2018-10-06 DIAGNOSIS — Z9989 Dependence on other enabling machines and devices: Secondary | ICD-10-CM | POA: Diagnosis not present

## 2018-10-06 DIAGNOSIS — E663 Overweight: Secondary | ICD-10-CM

## 2018-10-06 DIAGNOSIS — G4733 Obstructive sleep apnea (adult) (pediatric): Secondary | ICD-10-CM

## 2018-10-06 DIAGNOSIS — I7409 Other arterial embolism and thrombosis of abdominal aorta: Secondary | ICD-10-CM

## 2018-10-06 DIAGNOSIS — Z789 Other specified health status: Secondary | ICD-10-CM

## 2018-10-06 NOTE — Telephone Encounter (Signed)
Pt's wife, [name] notified per Dr. Ellyn Hack it is okay for pt to stop Eliquis 5mg  48 hours prior to procedure and restart one day after. See below:   Author: Leonie Man, MD Service: Cardiology Author Type: Physician  Filed: 10/06/2018 12:35 AM Encounter Date: 10/05/2018 Status: Signed  Editor: Leonie Man, MD (Physician)     [x] Leonie Man, MD  Okay to stop Eliquis 48 hours preprocedure.  Restart 1 day post procedure -or if necessary delay based on findings would be okay to hold until needed to restart.  [name]

## 2018-10-06 NOTE — Telephone Encounter (Signed)
-----   Message from Leonie Man, MD sent at 10/06/2018 12:35 AM EST -----   ----- Message ----- From: [place]ie [place], CMA Sent: 10/05/2018  10:04 AM EST To: Leonie Man, MD

## 2018-10-06 NOTE — Patient Instructions (Signed)
Medication Instructions:  NOT NEEDED If you need a refill on your cardiac medications before your next appointment, please call your pharmacy.   Lab work: LIPIDS CMP- DO NOT EAT OR DRINK THE MORNING OF THE TEST If you have labs (blood work) drawn today and your tests are completely normal, you will receive your results only by: [name] MyChart Message (if you have MyChart) OR . A paper copy in the mail If you have any lab test that is abnormal or we need to change your treatment, we will call you to review the results.  Testing/Procedures: NOT NEEDED  Follow-Up: At [facility], you and your health needs are our priority.  As part of our continuing mission to provide you with exceptional heart care, we have created designated Provider Care Teams.  These Care Teams include your primary Cardiologist (physician) and Advanced Practice Providers (APPs -  Physician Assistants and Nurse Practitioners) who all work together to provide you with the care you need, when you need it. You will need a follow up appointment in 6 months AUG 2020.  Please call our office 2 months in advance to schedule this appointment.  You may see [name], MD  or one of the following Advanced Practice Providers on your designated Care Team:   Rosaria Ferries, PA-C . Jory Sims, DNP, ANP  Any Other Special Instructions Will Be Listed Below (If Applicable).  DO NOT  RESUSCITATE ORDER HAS BEEN SIGNED - PLACE IN  PROMINENT PLACE AT [facility].

## 2018-10-06 NOTE — Progress Notes (Signed)
PCP: [name], MD Urologist: Dr. Ronda Fairly.  [facility] Radiation oncologist: Dr. Baruch Gouty  Clinic Note: Chief Complaint  Patient presents with  . Follow-up    Delayed follow-up, has had treatment for prostate cancer.  . Coronary Artery Disease    No angina  . Atrial Fibrillation    No recurrence  . Fall    Globally reduced balance with weakness.    HPI: [name] is a 85 y.o. male with a PMH below who presents today for hospital follow-up -- > ER visit on April 29 with concerns for rapid heartbeat/palpitations -possible A. fib-(of the time he got to the ER he was not A. fib).   PMH notable for CAD s/p CABGx4 in 1999, ischemic heart disease, PAF (new diagnosis) with chronotropic incompetence on Eliquis, carotid artery occlusion s/p L CEAHLD, HTN, OSA on CPAP, aortoiliac occlusive disease s/p common iliac artery stenting 2007.    Recent Hospitalizations:   n/a  [name] was last seen on June 2019 - f/u from CP evaluation - Myoview results.   Studies Personally Reviewed - (if available, images/films reviewed: From Epic Chart or Care Everywhere)  Myoview Jan 13, 2018: Low risk study.  EF greater than 65%.  Interval History: [name] returns today   overall feeling relatively well from a cardiac standpoint.  Is been having a lot of issues with ankle and foot pain as well as back pain that keeps him from walking.  He is not able to do anywhere near the exercise that he used to do, and is doing less "cardio exercise" that he had been doing.     He has been trying to stay active, and with what he does do, he really does not have any chest tightness or pressure.  No real dyspnea with exertion.  His fatigue has improved, and the palpitations are less prominent.  Is also not really noticing any significant chest pressure tightness. He told me during this visit about recent diagnosis of prostate cancer, but we did not discuss the potential need for him to have radioactive  seeds implanted.  He seems to really be quite depressed and down by this diagnosis and upcoming treatments of XRT.  Not only is this diagnosis making it feel down, I think his arthritis pains are really bothering him as well.  He is just worried about his overall recovery from all this and is upset about not being to do what he used to be able to. He does not have any heart failure symptoms of PND, orthopnea or edema really just minimal edema.  No syncope/near syncope or TIA/amaurosis fugax.  He does occasionally get pretty significantly fatigue if not happens he may feel a few skipped beats and little uneasy sensation in his chest associated with palpitations, but that is exceedingly rare.  No real anginal symptoms. He still notes easy bruising, no significant bleeding -just a nuisance bleeding.  No melena, hematochezia, hematuria or epistaxis. No claudication.  ROS: A comprehensive was performed.  Pertinent symptoms noted above Review of Systems  Constitutional: Positive for malaise/fatigue (Seems to be partially lacking energy and partially now almost depression from all of the muscular skeletal issues and the new diagnosis of prostate cancer.).  HENT: Negative for congestion.   Respiratory: Positive for shortness of breath (Probably more because of discomfort from osteoarthritis ).   Cardiovascular: Negative for leg swelling.  Musculoskeletal: Positive for back pain and joint pain (Hip and knee, and feet minutes --limits his  walking more than dyspnea.).  Skin:       Multiple Band-Aids bleeding. Bandage on the nose from procedure  Neurological: Negative for dizziness, focal weakness and weakness.  Endo/Heme/Allergies: Bruises/bleeds easily.  Psychiatric/Behavioral: Negative for memory loss. The patient is not nervous/anxious and does not have insomnia.   All other systems reviewed and are negative.   I have reviewed and (if needed) personally updated the patient's problem list,  medications, allergies, past medical and surgical history, social and family history.   Past Medical History:  Diagnosis Date  . Arthritis   . Atherosclerotic heart disease of native coronary artery without angina pectoris 1998   - s/p CABG, occluded SVG-D1; patent LIMA-LAD, SVG-OM, SVG- RCA  . Basal cell carcinoma of eyelid 01/03/2013   2019 - R side of Nose  . Benign neoplasm of ascending colon   . Benign neoplasm of descending colon   . Cancer [facility])    Skin cancer  . Colon polyps   . COPD (chronic obstructive pulmonary disease) ([place])   . GERD (gastroesophageal reflux disease)   . Hemorrhoid 02/10/2015  . History of hiatal hernia   . HOH (hard of hearing)   . Hypertension   . Memory loss   . Osteoporosis   . PAF (paroxysmal atrial fibrillation) ([place])   . Prostate disorder   . Sciatic pain    Chronic  . Sleep apnea    cpap  . Temporary cerebral vascular dysfunction 02/10/2015   Had negative work-up.  July, 2012.  No medication changes, had forgot them that day, got dehydrated.     Past Surgical History:  Procedure Laterality Date  . ANKLE SURGERY    . Arch aortogram and carotid aortogram  06/02/2006   Dr Oneida Alar did surgery  . BASAL CELL CARCINOMA EXCISION  04/2016   Dermatology  . CARDIAC CATHETERIZATION  01/30/1998    (Dr. Linard Millers): Native LAD & mRCA CTO. 90% D1. Mod Cx. SVG-D1 CTO. SVG-RCA & SVG-OM along with LIMA-LAD patent;  LV NORMAL Fxn  . CAROTID ENDARTERECTOMY Left Oct. 29, 2007   Dr. Oneida Alar:  . CATARACT EXTRACTION W/PHACO Right 03/05/2017   Procedure: CATARACT EXTRACTION PHACO AND INTRAOCULAR LENS PLACEMENT (IOC);  Surgeon: Leandrew Koyanagi, MD;  Location: [facility] ORS;  Service: Ophthalmology;  Laterality: Right;  [place] 00:58.5 AP% 10.6 CDE 6.20 Fluid Pack lot # [id number]  . CATARACT EXTRACTION W/PHACO Left 04/15/2017   Procedure: CATARACT EXTRACTION PHACO AND INTRAOCULAR LENS PLACEMENT ([place])  Left;  Surgeon: Leandrew Koyanagi, MD;  Location: [place];  Service: Ophthalmology;  Laterality: Left;  . COLONOSCOPY WITH PROPOFOL N/A 09/15/2017   Procedure: COLONOSCOPY WITH PROPOFOL;  Surgeon: Lucilla Lame, MD;  Location: [facility] ENDOSCOPY;  Service: Endoscopy;  Laterality: N/A;  . CORONARY ARTERY BYPASS GRAFT  1998   LIMA-LAD, SVG-OM, SVG-RPDA, SVG-DIAG  . CPET / MET  12/2012   Mild chronotropic incompetence - read 82% of predicted; also reduced effort; peak VO2 15.7 / 75% (did not reach Max effort) -- suggested ischemic response in last 1.5 minutes of exercise. Normal pulmonary function on PFTs but poor response to  . DOPPLER ECHOCARDIOGRAPHY  11/20/2010   EF =>55%; LV norm mild aortic scelorosis  . ESOPHAGOGASTRODUODENOSCOPY (EGD) WITH PROPOFOL N/A 09/15/2017   Procedure: ESOPHAGOGASTRODUODENOSCOPY (EGD) WITH PROPOFOL;  Surgeon: Lucilla Lame, MD;  Location: [facility] ENDOSCOPY;  Service: Endoscopy;  Laterality: N/A;  . FRACTURE SURGERY Right 03/19/2015   wrist  . HIATAL HERNIA REPAIR    . ILIAC ARTERY STENT  12/15/2005  PTA and direct stenting rgt and lft common iliac arteries  . NM GATED MYOVIEW ([place] [facility])  12/2017   Low Risk - no ischemia or infarct.  EF > 65%. no RWMA  . NM MYOVIEW ([place] [facility])  11/11/2010; February 2016   a) TM STRESS----NORMAL PERFUSION ,EF 68%; b) EF 50-55%. Normal LV function. No significant ischemia or infarction.  . ORIF ELBOW FRACTURE Right 01/01/2017   Procedure: OPEN REDUCTION INTERNAL FIXATION (ORIF) ELBOW/OLECRANON FRACTURE;  Surgeon: Hessie Knows, MD;  Location: [facility] ORS;  Service: Orthopedics;  Laterality: Right;  . ORIF WRIST FRACTURE Right 03/19/2015   Procedure: OPEN REDUCTION INTERNAL FIXATION (ORIF) WRIST FRACTURE;  Surgeon: Hessie Knows, MD;  Location: [facility] ORS;  Service: Orthopedics;  Laterality: Right;  . THORACIC AORTA - CAROTID ANGIOGRAM  October 2007   Dr. 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 TRANSTHORACIC  ECHOCARDIOGRAM  February 2016   [facility]: Normal LV function. Dilated left atrium.    Current Meds  Medication Sig  . acetaminophen (TYLENOL) 500 MG tablet Take 500-1,000 mg by mouth every 6 (six) hours as needed (for pain.).  [name] ADVAIR DISKUS 250-50 MCG/DOSE AEPB INHALE 1 PUFF TWICE A DAY AS DIRECTED **RINSE MOUTH AFTER USE**  . apixaban (ELIQUIS) 5 MG TABS tablet Take 1 tablet (5 mg total) by mouth 2 (two) times daily.  . Ascorbic Acid (VITAMIN C) 1000 MG tablet Take 1,000 mg by mouth daily.  . cetirizine (ZYRTEC) 10 MG tablet Take 10 mg by mouth daily as needed.   . Cholecalciferol (VITAMIN D-3 PO) Take 2,000 Units by mouth every evening.   . Cyanocobalamin (VITAMIN B-12) 500 MCG SUBL Place 500 mcg under the tongue daily.   [name] DEXILANT 60 MG capsule TAKE 1 CAPSULE EVERY DAY  . donepezil (ARICEPT) 10 MG tablet Take 10 mg by mouth at bedtime.   [name] ezetimibe (ZETIA) 10 MG tablet TAKE 1 TABLET BY MOUTH DAILY  . finasteride (PROSCAR) 5 MG tablet Take 5 mg by mouth daily.  . fluticasone (FLONASE) 50 MCG/ACT nasal spray Place 2 sprays into both nostrils daily. (Patient taking differently: Place 2 sprays into both nostrils daily as needed for allergies. )  . lisinopril (PRINIVIL,ZESTRIL) 2.5 MG tablet TAKE ONE TABLET BY MOUTH TWICE DAILY  . MAGNESIUM OXIDE PO Take 500 mg by mouth every evening.   . metoprolol tartrate (LOPRESSOR) 25 MG tablet TAKE ONE TABLET BY MOUTH TWICE DAILY  . mirabegron ER (MYRBETRIQ) 50 MG TB24 tablet Take 1 tablet (50 mg total) by mouth daily.  . Multiple Vitamins-Minerals (PRESERVISION AREDS 2 PO) Take 1 tablet by mouth 2 (two) times daily.   . nitroGLYCERIN (NITROSTAT) 0.4 MG SL tablet Place 1 tablet (0.4 mg total) under the tongue every 5 (five) minutes as needed for chest pain.  . NON FORMULARY Swish and spit 1 Dose at bedtime. Nystatin elixir month wash   . nortriptyline (PAMELOR) 10 MG capsule Take 10 mg by mouth at bedtime.   [name] nystatin (MYCOSTATIN) 100000 UNIT/ML  suspension Use as directed 5 mLs in the mouth or throat daily as needed (irritation).   . rosuvastatin (CRESTOR) 20 MG tablet TAKE ONE TABLET EVERY DAY  . tamsulosin (FLOMAX) 0.4 MG CAPS capsule Take 0.4 mg by mouth daily.    Allergies  Allergen Reactions  . Clonazepam Other (See Comments)    Altered mental status  . Codeine Other (See Comments)    Altered mental status.  [name]  Niacin And Related Other (See Comments)    Flushing of skin  . Tramadol     Other reaction(s): Hallucination  . Hydrocodone     confusion  . Oxycodone Other (See Comments)    confusion confusion    Social History   Tobacco Use  . Smoking status: Former Smoker    Types: Cigarettes    Last attempt to quit: 08/04/1997    Years since quitting: 21.1  . Smokeless tobacco: Never Used  Substance Use Topics  . Alcohol use: No  . Drug use: No   Social History   Social History Narrative   Resides at [facility] since 06/2018   He is a widowed father of one, grandfather of 26 with 3 stepchildren.    Now accompanied by his long-term significant other.      Currently undergoing physical therapy, Occupational Therapy treatments.      He has changed his CODE STATUS to DNR.    family history includes Alcohol abuse in his father; Dementia in his mother; Ulcers in his mother.  Wt Readings from Last 3 Encounters:  10/06/18 218 lb 12.8 oz (99.2 kg)  10/04/18 216 lb 6.4 oz (98.2 kg)  09/29/18 217 lb 9.5 oz (98.7 kg)    PHYSICAL EXAM BP (!) 148/70   Pulse (!) 58   Ht 6' (1.829 m)   Wt 218 lb 12.8 oz (99.2 kg)   BMI 29.67 kg/m  Physical Exam  Constitutional: He is oriented to person, place, and time. He appears well-developed and well-nourished. No distress.  Well-groomed.  HENT:  Head: Normocephalic and atraumatic.  Eyes: Pupils are equal, round, and reactive to light. EOM are normal.  Neck: Normal range of motion. No JVD present.  Cardiovascular: Normal rate, regular rhythm, normal heart sounds and intact  distal pulses.  Occasional extrasystoles are present. PMI is not displaced. Exam reveals no gallop and no friction rub.  No murmur heard. Normal S1 physiologically split S2  Pulmonary/Chest: Effort normal and breath sounds normal. No respiratory distress. He has no wheezes. He has no rales.  Abdominal: Soft. Bowel sounds are normal. He exhibits no distension. There is no abdominal tenderness. There is no rebound.  Musculoskeletal: Normal range of motion.        General: Edema (Trivial) present.  Neurological: He is alert and oriented to person, place, and time.  Psychiatric: His behavior is normal. Judgment and thought content normal.  Somewhat more somber and depressed mood and affect today.  Vitals reviewed.    Adult ECG Report N/a  Other studies Reviewed: Additional studies/ records that were reviewed today include:  Recent Labs:   Lab Results  Component Value Date   CHOL 118 04/30/2017   HDL 40 04/30/2017   LDLCALC 54 04/30/2017   TRIG 122 04/30/2017   CHOLHDL 3.0 04/30/2017   Lab Results  Component Value Date   CREATININE 1.06 03/31/2018   BUN 18 03/31/2018   NA 143 03/31/2018   K 3.3 (L) 03/31/2018   CL 110 03/31/2018   CO2 25 03/31/2018     ASSESSMENT / PLAN: Problem List Items Addressed This Visit    Aorto-iliac disease ([place]) (Chronic)    Has been followed by Dr. Oneida Alar.  I do not think that this is truly claudication symptoms he is having, is more likely related to just fatigue and weakness.      Atherosclerotic heart disease of native coronary artery without angina pectoris - s/p CABG, occluded SVG-D1; patent LIMA-LAD, SVG-OM, SVG- RCA (Chronic)  Doing relatively well from a cardiac standpoint as far as any anginal symptoms.  Negative Myoview back in May.  Suspect that some of his dyspnea symptoms are related to recurrent A. fib. Remains on beta-blocker and ACE inhibitor at low doses.  Not on aspirin because of Eliquis.  Also not on Plavix.  Is on Crestor plus  Zetia.  No further ischemic evaluations unless symptoms warrant.      Do not intubate, cardiopulmonary resuscitation (CPR)-only code status    I think this is a change from previous visit.  I with the multiple comorbidities now including his recent cancer surgery, he has changes mind to be DNR-DNI. Yellow form signed for home records.      Hyperlipidemia with target LDL less than 70 - Primary (Chronic)    Due for follow-up lipid panel.  Last labs were checked in August 2018.  We will check a lipid panel and comprehensive metabolic panel. Remains on rosuvastatin 20 mg along with 10 mg Zetia daily.  Last set of labs showed relatively well-controlled LDL.      Relevant Orders   Lipid panel   Comprehensive metabolic panel   Ischemic heart disease with chronotropic incompetence (Chronic)    CPX demonstrated exertional dyspnea.  In part related to chronotropic incompetence.  Would not further titrate up beta-blocker      Relevant Orders   Lipid panel   Comprehensive metabolic panel   OSA on CPAP (Chronic)    Has difficulty wearing CPAP at night long.  When he does wear it all night long he feels much more relaxed and rested.      Overweight (BMI 25.0-29.9) -- Wgt back up (Chronic)    He seems like he is ready to start get back into his exercise regimen.  He says his energy level has improved and he is still working PT/ OT.      PAF (paroxysmal atrial fibrillation) (HCC) CHA2DS2-VASc = 4. AC = Eliquis (Chronic)    Maybe 1 episode that he can member back in July of recurrent A. fib, but otherwise minimal recurrence.  Remains on metoprolol for rate control and Eliquis for anticoagulation.  Okay to hold Eliquis for colonoscopies and other procedures.  At this point I think rate control with potential cardioversion if necessary) is probably the best option.      Relevant Orders   EKG 12-Lead (Completed)      I spent a total of 35  minutes with the patient and chart review. >  50% of  the time was spent in direct patient consultation.   Current medicines are reviewed at length with the patient today.  (+/- concerns) -  The following changes have been made:  none  Patient Instructions  Medication Instructions:  NOT NEEDED If you need a refill on your cardiac medications before your next appointment, please call your pharmacy.   Lab work: LIPIDS CMP- DO NOT EAT OR DRINK THE MORNING OF THE TEST If you have labs (blood work) drawn today and your tests are completely normal, you will receive your results only by: [name] MyChart Message (if you have MyChart) OR . A paper copy in the mail If you have any lab test that is abnormal or we need to change your treatment, we will call you to review the results.  Testing/Procedures: NOT NEEDED  Follow-Up: At [facility], you and your health needs are our priority.  As part of our continuing mission to provide you with exceptional heart care, we  have created designated Provider Care Teams.  These Care Teams include your primary Cardiologist (physician) and Advanced Practice Providers (APPs -  Physician Assistants and Nurse Practitioners) who all work together to provide you with the care you need, when you need it. You will need a follow up appointment in 6 months AUG 2020.  Please call our office 2 months in advance to schedule this appointment.  You may see [name], MD  or one of the following Advanced Practice Providers on your designated Care Team:   Rosaria Ferries, PA-C . Jory Sims, DNP, ANP  Any Other Special Instructions Will Be Listed Below (If Applicable).  DO NOT  RESUSCITATE ORDER HAS BEEN SIGNED - PLACE IN  PROMINENT PLACE AT [facility].    Studies Ordered:   Orders Placed This Encounter  Procedures  . Lipid panel  . Comprehensive metabolic panel  . EKG 12-Lead      [name], M.D., M.S. Interventional Cardiologist   Pager # [phone] Phone # [phone] [address]. [place], [place] [address]   Thank you for choosing Heartcare at [facility]!!

## 2018-10-06 NOTE — Progress Notes (Signed)
Okay to stop Eliquis 48 hours preprocedure.  Restart 1 day post procedure -or if necessary delay based on findings would be okay to hold until needed to restart.  [name] c

## 2018-10-08 ENCOUNTER — Encounter: Payer: Self-pay | Admitting: Cardiology

## 2018-10-08 DIAGNOSIS — Z789 Other specified health status: Secondary | ICD-10-CM | POA: Insufficient documentation

## 2018-10-08 NOTE — Assessment & Plan Note (Signed)
Has difficulty wearing CPAP at night long.  When he does wear it all night long he feels much more relaxed and rested.

## 2018-10-08 NOTE — Assessment & Plan Note (Signed)
Has been followed by Dr. Oneida Alar.  I do not think that this is truly claudication symptoms he is having, is more likely related to just fatigue and weakness.

## 2018-10-08 NOTE — Assessment & Plan Note (Signed)
Maybe 1 episode that he can member back in July of recurrent A. fib, but otherwise minimal recurrence.  Remains on metoprolol for rate control and Eliquis for anticoagulation.  Okay to hold Eliquis for colonoscopies and other procedures.  At this point I think rate control with potential cardioversion if necessary) is probably the best option.

## 2018-10-08 NOTE — Assessment & Plan Note (Signed)
I think this is a change from previous visit.  I with the multiple comorbidities now including his recent cancer surgery, he has changes mind to be DNR-DNI. Yellow form signed for home records.

## 2018-10-08 NOTE — Assessment & Plan Note (Signed)
CPX demonstrated exertional dyspnea.  In part related to chronotropic incompetence.  Would not further titrate up beta-blocker

## 2018-10-08 NOTE — Assessment & Plan Note (Signed)
Doing relatively well from a cardiac standpoint as far as any anginal symptoms.  Negative Myoview back in May.  Suspect that some of his dyspnea symptoms are related to recurrent A. fib. Remains on beta-blocker and ACE inhibitor at low doses.  Not on aspirin because of Eliquis.  Also not on Plavix.  Is on Crestor plus Zetia.  No further ischemic evaluations unless symptoms warrant.

## 2018-10-08 NOTE — Assessment & Plan Note (Addendum)
Due for follow-up lipid panel.  Last labs were checked in August 2018.  We will check a lipid panel and comprehensive metabolic panel. Remains on rosuvastatin 20 mg along with 10 mg Zetia daily.  Last set of labs showed relatively well-controlled LDL.

## 2018-10-08 NOTE — Assessment & Plan Note (Signed)
He seems like he is ready to start get back into his exercise regimen.  He says his energy level has improved and he is still working PT/ OT.

## 2018-10-12 NOTE — Progress Notes (Signed)
* [facility]     Assessment and Plan:  Obstructive sleep apnea. -Currently on auto CPAP with set pressure of 10-20.  -Now doing much better and is less sleepy during the day. His leaks are slightly elevated and his AHI is 9, however I think we will continue with current treatment rather than trying to look for a perfect fit.   Periodic limb movements in sleep. -Sleep study showed severe periodic limb movements, these persisted on the patient's titration study with an arousal index of 5. -ferritin level, 08/01/16; 46 (normal).  Can recheck ferritin if symptoms continue.  Insomnia. -improved, will continue to monitor.  Restrictive lung disease. -Doubt NSIP, changes are likely related to underlying emphysema. No active treatment required at this time.  COPD/emphysema. -Significant emphysematous changes seen on CT chest. -Continue Advair. -Increase activity level.  Lung nodule. -Right upper lobe granuloma, calcified. No further follow-up needed.  Atrial fibrillation. -Sleep apnea can contribute to atrial fibrillation, therefore treatment of sleep apnea is important part of management.  Weakness/deconditioning. -As above secondary to multiple comorbidities, patient has to try to increase his physical activity level in a safe manner.    Date: 10/12/2018  MRN# [id number] [name] 12-31-1933   [name] is a 85 y.o. old male seen in follow up for chief complaint of  Chief Complaint  Patient presents with  . Sleep Apnea     HPI:  The patient is an 85 year old male with history of COPD, sleep apnea.  At last visit his family noted that he was very tired during the day and he was tolerating his CPAP.  He was sent to mask fitting clinic, he was also noted that after he got through the first hour on his download he seemed to wear CPAP the whole night. He was asked to continue Advair. Today he notes that he has been doing much better, he has been  using the cpap much more often, he is much more awake during the day and not snoring anymore. They use SoClean.  He feels that the breathing is doing well, he has been using advair bid, and feels that the breathing is doing well. He has not had any further exacerbations.   **CPAP download 09/12/2018-10/11/2018>> raw data personally reviewed.  Uses greater than 4 hours is 24/30 days.  Average usage on days used 7 hours 36 minutes.  Pressure range 10-20.  Median pressure 10, 95 percentile 12, max pressure of 13.4.  Leaks are slightly elevated with a median of 31 L.  AHI is 9.  Overall this shows good compliance with adequate control of obstructive sleep apnea with elevated leaks. **CPAP download data 03/03/2018-05/31/2018>> raw data personally reviewed.  Usage greater than 4 hours is 11/90 days.  Average usage on days used 4 hours 15 minutes.  Pressure set is 10-20.  Median pressure 5, 95th percentile pressure 9, maximum pressure 10.  Residual AHI is 2.6.  This shows poor compliance with CPAP with excellent control of obstructive sleep apnea when used.  There is a discrepancy between the set pressure in the actual pressure range. **Review of download data for 1 month as a 11/26/17.  Uses greater than 4 hours is 20/30 days.  Average usage on days used is 5 hours 44 minutes.  Auto set pressure 10-20.  Median pressure 5, 95th percentile pressure is 9, maximum pressure is 11.  Residual AHI is 3.1.  **Review of his recent sleep titration study showed that his OSA was  inadequately treated at the current level, and his PLMI and arousal index remained elevated at 5. At last visit his CPAP was changed to auto 10-20.  **Review of download data 30 days as of 05/24/17; Uses greater than 4 hours is 18 days, average usage on days used was 4 hours 57 minutes, sit pressors 8, residual AHI is 15.9, central apnea index is 1.7.  Review and summary of previous testing: --PFT 11/29/14; FVC=93%; Fev1=103%; ratio=80%. RV=58%, TLC=76%,  rv/tlc=71%; Diffusion=37%; appears consistent with restriction.  --The patient has a history of OSA and COPD. Review of sleep study on 05/14/11 showed AHI of 6.9 with RDI of 41. Sleep efficiency was 53% and latency was short at 8 min. PLMI was very elevated at 85 with an arousal index of 9.  --Titration study on 12/12/14; sleep latency of 11 min; with sleep efficiency of 50%, AHI=40. Cpap titrated from 4-14; CPAP of 12 was recommended, however the patient continued to have elevated of 26 at pressure of 14.   Ct images; 12/15/15; 02/21/15--  There is persistent emphysematous changes, greatest in upper lobes. Stable RUL calcified granuloma. 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 Per report it is unchanged, but to my eye it appears that it is advancing. Slight mediastinal lymphadenopathy, paratracheal areas.    Medication:   Outpatient Encounter Medications as of 10/13/2018  Medication Sig  . acetaminophen (TYLENOL) 500 MG tablet Take 500-1,000 mg by mouth every 6 (six) hours as needed (for pain.).  [name] ADVAIR DISKUS 250-50 MCG/DOSE AEPB INHALE 1 PUFF TWICE A DAY AS DIRECTED **RINSE MOUTH AFTER USE**  . apixaban (ELIQUIS) 5 MG TABS tablet Take 1 tablet (5 mg total) by mouth 2 (two) times daily.  . Ascorbic Acid (VITAMIN C) 1000 MG tablet Take 1,000 mg by mouth daily.  . cetirizine (ZYRTEC) 10 MG tablet Take 10 mg by mouth daily as needed.   . Cholecalciferol (VITAMIN D-3 PO) Take 2,000 Units by mouth every evening.   . Cyanocobalamin (VITAMIN B-12) 500 MCG SUBL Place 500 mcg under the tongue daily.   [name] DEXILANT 60 MG capsule TAKE 1 CAPSULE EVERY DAY  . donepezil (ARICEPT) 10 MG tablet Take 10 mg by mouth at bedtime.   [name] ezetimibe (ZETIA) 10 MG tablet TAKE 1 TABLET BY MOUTH DAILY  . finasteride (PROSCAR) 5 MG tablet Take 5 mg by mouth daily.  . fluticasone (FLONASE) 50 MCG/ACT nasal spray Place 2 sprays into both nostrils daily. (Patient taking differently: Place  2 sprays into both nostrils daily as needed for allergies. )  . lisinopril (PRINIVIL,ZESTRIL) 2.5 MG tablet TAKE ONE TABLET BY MOUTH TWICE DAILY  . MAGNESIUM OXIDE PO Take 500 mg by mouth every evening.   . metoprolol tartrate (LOPRESSOR) 25 MG tablet TAKE ONE TABLET BY MOUTH TWICE DAILY  . mirabegron ER (MYRBETRIQ) 50 MG TB24 tablet Take 1 tablet (50 mg total) by mouth daily.  . Multiple Vitamins-Minerals (PRESERVISION AREDS 2 PO) Take 1 tablet by mouth 2 (two) times daily.   . nitroGLYCERIN (NITROSTAT) 0.4 MG SL tablet Place 1 tablet (0.4 mg total) under the tongue every 5 (five) minutes as needed for chest pain.  . NON FORMULARY Swish and spit 1 Dose at bedtime. Nystatin elixir month wash   . nortriptyline (PAMELOR) 10 MG capsule Take 10 mg by mouth at bedtime.   [name] nystatin (MYCOSTATIN) 100000 UNIT/ML suspension Use as directed 5 mLs in the mouth or throat daily as needed (irritation).   . rosuvastatin (  CRESTOR) 20 MG tablet TAKE ONE TABLET EVERY DAY  . tamsulosin (FLOMAX) 0.4 MG CAPS capsule Take 0.4 mg by mouth daily.   No facility-administered encounter medications on file as of 10/13/2018.      Allergies:  Clonazepam; Codeine; Niacin and related; Tramadol; Hydrocodone; and Oxycodone  Review of Systems:  Constitutional: Feels well. Cardiovascular: Denies chest pain, exertional chest pain.  Pulmonary: Denies hemoptysis, pleuritic chest pain.   The remainder of systems were reviewed and were found to be negative other than what is documented in the HPI.    Physical Examination:   VS: BP 106/70 (BP Location: Left Arm, Cuff Size: Large)   Pulse 92   Resp 16   Ht 6' (1.829 m)   Wt 218 lb (98.9 kg)   SpO2 94%   BMI 29.57 kg/m   General Appearance: No distress  Neuro:without focal findings, mental status, speech normal, alert and oriented HEENT: PERRLA, EOM intact Pulmonary: No wheezing, No rales  CardiovascularNormal S1,S2.  No m/r/g.  Abdomen: Benign, Soft, non-tender, No  masses Renal:  No costovertebral tenderness  GU:  No performed at this time. Endoc: No evident thyromegaly, no signs of acromegaly or Cushing features Skin:   warm, no rashes, no ecchymosis  Extremities: normal, no cyanosis, clubbing.      LABORATORY PANEL:   CBC No results for input(s): WBC, HGB, HCT, PLT in the last 168 hours. ------------------------------------------------------------------------------------------------------------------  Chemistries  No results for input(s): NA, K, CL, CO2, GLUCOSE, BUN, CREATININE, CALCIUM, MG, AST, ALT, ALKPHOS, BILITOT in the last 168 hours.  Invalid input(s): GFRCGP ------------------------------------------------------------------------------------------------------------------  Cardiac Enzymes No results for input(s): TROPONINI in the last 168 hours. ------------------------------------------------------------  RADIOLOGY:   No results found for this or any previous visit. Results for orders placed during the hospital encounter of 06/02/16  DG Chest 2 View   Narrative CLINICAL DATA:  Fatigue and weakness.  EXAM: CHEST  2 VIEW  COMPARISON:  CT of the chest on 12/05/2015  FINDINGS: The heart size and mediastinal contours are within normal limits and stable post CABG. Stable calcified granuloma at the right lung apex. Stable bilateral parenchymal scarring. There is no evidence of pulmonary edema, consolidation, pneumothorax, nodule or pleural fluid. The visualized skeletal structures are unremarkable.  IMPRESSION: No active cardiopulmonary disease.   Electronically Signed   By: Aletta Edouard M.D.   On: 06/02/2016 16:19    ------------------------------------------------------------------------------------------------------------------  Thank  you for allowing [facility] to assist in the care of your patient. Our recommendations are noted above.  Please contact us if we can be of further  service.  Marda Stalker, M.D., F.C.C.P.  Board Certified in Internal Medicine, Pulmonary Medicine, [place], and Sleep Medicine.  [facility] Office Number: [phone]  10/12/2018

## 2018-10-13 ENCOUNTER — Ambulatory Visit (INDEPENDENT_AMBULATORY_CARE_PROVIDER_SITE_OTHER): Payer: Medicare Other | Admitting: Internal Medicine

## 2018-10-13 ENCOUNTER — Encounter: Payer: Self-pay | Admitting: Internal Medicine

## 2018-10-13 VITALS — BP 106/70 | HR 92 | Resp 16 | Ht 72.0 in | Wt 218.0 lb

## 2018-10-13 DIAGNOSIS — G4733 Obstructive sleep apnea (adult) (pediatric): Secondary | ICD-10-CM

## 2018-10-13 DIAGNOSIS — J432 Centrilobular emphysema: Secondary | ICD-10-CM

## 2018-10-13 DIAGNOSIS — Z9989 Dependence on other enabling machines and devices: Secondary | ICD-10-CM | POA: Diagnosis not present

## 2018-10-13 DIAGNOSIS — I2589 Other forms of chronic ischemic heart disease: Secondary | ICD-10-CM

## 2018-10-13 NOTE — Patient Instructions (Signed)
Continue advair.  Continue CPAP every night.

## 2018-10-14 ENCOUNTER — Other Ambulatory Visit (INDEPENDENT_AMBULATORY_CARE_PROVIDER_SITE_OTHER): Payer: Medicare Other | Admitting: *Deleted

## 2018-10-14 DIAGNOSIS — E7849 Other hyperlipidemia: Secondary | ICD-10-CM | POA: Diagnosis not present

## 2018-10-14 DIAGNOSIS — I2589 Other forms of chronic ischemic heart disease: Secondary | ICD-10-CM | POA: Diagnosis not present

## 2018-10-15 ENCOUNTER — Ambulatory Visit: Payer: Medicare Other

## 2018-10-15 DIAGNOSIS — R2689 Other abnormalities of gait and mobility: Secondary | ICD-10-CM

## 2018-10-15 DIAGNOSIS — R262 Difficulty in walking, not elsewhere classified: Secondary | ICD-10-CM

## 2018-10-15 DIAGNOSIS — R2681 Unsteadiness on feet: Secondary | ICD-10-CM

## 2018-10-15 DIAGNOSIS — M6281 Muscle weakness (generalized): Secondary | ICD-10-CM

## 2018-10-15 LAB — COMPREHENSIVE METABOLIC PANEL
ALT: 10 IU/L (ref 0–44)
AST: 15 IU/L (ref 0–40)
Albumin/Globulin Ratio: 2 (ref 1.2–2.2)
Albumin: 4.1 g/dL (ref 3.6–4.6)
Alkaline Phosphatase: 91 IU/L (ref 39–117)
BUN/Creatinine Ratio: 12 (ref 10–24)
BUN: 13 mg/dL (ref 8–27)
Bilirubin Total: 1 mg/dL (ref 0.0–1.2)
CO2: 22 mmol/L (ref 20–29)
Calcium: 9.3 mg/dL (ref 8.6–10.2)
Chloride: 106 mmol/L (ref 96–106)
Creatinine, Ser: 1.09 mg/dL (ref 0.76–1.27)
GFR calc Af Amer: 72 mL/min/{1.73_m2} (ref >59)
GFR calc non Af Amer: 62 mL/min/{1.73_m2} (ref >59)
Globulin, Total: 2.1 g/dL (ref 1.5–4.5)
Glucose: 85 mg/dL (ref 65–99)
Potassium: 4.3 mmol/L (ref 3.5–5.2)
Sodium: 146 mmol/L — ABNORMAL HIGH (ref 134–144)
Total Protein: 6.2 g/dL (ref 6.0–8.5)

## 2018-10-15 LAB — LIPID PANEL
Chol/HDL Ratio: 3.1 ratio (ref 0.0–5.0)
Cholesterol, Total: 110 mg/dL (ref 100–199)
HDL: 35 mg/dL — ABNORMAL LOW (ref >39)
LDL Calculated: 44 mg/dL (ref 0–99)
Triglycerides: 153 mg/dL — ABNORMAL HIGH (ref 0–149)
VLDL Cholesterol Cal: 31 mg/dL (ref 5–40)

## 2018-10-15 NOTE — Therapy (Signed)
[place] MAIN [facility] SERVICES [address] [place], [place], [address] Phone: [phone]   Fax:  [phone]  Physical Therapy Treatment  Patient Details  Name: [name] MRN: [id number] Date of Birth: 05/22/1934 No data recorded  Encounter Date: 10/15/2018  PT End of Session - 10/15/18 1007    Visit Number  32    Number of Visits  35    Date for PT Re-Evaluation  11/02/18    Authorization Type  2/10    PT Start Time  1001    PT Stop Time  1045    PT Time Calculation (min)  44 min    Equipment Utilized During Treatment  Gait belt    Activity Tolerance  Patient tolerated treatment well    Behavior During Therapy  WFL for tasks assessed/performed       Past Medical History:  Diagnosis Date  . Arthritis   . Atherosclerotic heart disease of native coronary artery without angina pectoris 1998   - s/p CABG, occluded SVG-D1; patent LIMA-LAD, SVG-OM, SVG- RCA  . Basal cell carcinoma of eyelid 01/03/2013   2019 - R side of Nose  . Benign neoplasm of ascending colon   . Benign neoplasm of descending colon   . Cancer [facility])    Skin cancer  . Colon polyps   . COPD (chronic obstructive pulmonary disease) ([place])   . GERD (gastroesophageal reflux disease)   . Hemorrhoid 02/10/2015  . History of hiatal hernia   . HOH (hard of hearing)   . Hypertension   . Memory loss   . Osteoporosis   . PAF (paroxysmal atrial fibrillation) ([place])   . Prostate disorder   . Sciatic pain    Chronic  . Sleep apnea    cpap  . Temporary cerebral vascular dysfunction 02/10/2015   Had negative work-up.  July, 2012.  No medication changes, had forgot them that day, got dehydrated.     Past Surgical History:  Procedure Laterality Date  . ANKLE SURGERY    . Arch aortogram and carotid aortogram  06/02/2006   Dr Oneida Alar did surgery  . BASAL CELL CARCINOMA EXCISION  04/2016   Dermatology  . CARDIAC CATHETERIZATION  01/30/1998    (Dr. Linard Millers): Native LAD  & mRCA CTO. 90% D1. Mod Cx. SVG-D1 CTO. SVG-RCA & SVG-OM along with LIMA-LAD patent;  LV NORMAL Fxn  . CAROTID ENDARTERECTOMY Left Oct. 29, 2007   Dr. Oneida Alar:  . CATARACT EXTRACTION W/PHACO Right 03/05/2017   Procedure: CATARACT EXTRACTION PHACO AND INTRAOCULAR LENS PLACEMENT (IOC);  Surgeon: Leandrew Koyanagi, MD;  Location: [facility] ORS;  Service: Ophthalmology;  Laterality: Right;  [place] 00:58.5 AP% 10.6 CDE 6.20 Fluid Pack lot # [id number]  . CATARACT EXTRACTION W/PHACO Left 04/15/2017   Procedure: CATARACT EXTRACTION PHACO AND INTRAOCULAR LENS PLACEMENT ([place])  Left;  Surgeon: Leandrew Koyanagi, MD;  Location: [facility];  Service: Ophthalmology;  Laterality: Left;  . COLONOSCOPY WITH PROPOFOL N/A 09/15/2017   Procedure: COLONOSCOPY WITH PROPOFOL;  Surgeon: Lucilla Lame, MD;  Location: [facility] ENDOSCOPY;  Service: Endoscopy;  Laterality: N/A;  . CORONARY ARTERY BYPASS GRAFT  1998   LIMA-LAD, SVG-OM, SVG-RPDA, SVG-DIAG  . CPET / MET  12/2012   Mild chronotropic incompetence - read 82% of predicted; also reduced effort; peak VO2 15.7 / 75% (did not reach Max effort) -- suggested ischemic response in last 1.5 minutes of exercise. Normal pulmonary function on PFTs but poor response to  . DOPPLER ECHOCARDIOGRAPHY  11/20/2010   EF =>55%; LV norm mild aortic scelorosis  . ESOPHAGOGASTRODUODENOSCOPY (EGD) WITH PROPOFOL N/A 09/15/2017   Procedure: ESOPHAGOGASTRODUODENOSCOPY (EGD) WITH PROPOFOL;  Surgeon: Lucilla Lame, MD;  Location: [facility] ENDOSCOPY;  Service: Endoscopy;  Laterality: N/A;  . FRACTURE SURGERY Right 03/19/2015   wrist  . HIATAL HERNIA REPAIR    . ILIAC ARTERY STENT  12/15/2005   PTA and direct stenting rgt and lft common iliac arteries  . NM GATED MYOVIEW ([place] HX)  12/2017   Low Risk - no ischemia or infarct.  EF > 65%. no RWMA  . NM MYOVIEW ([place] HX)  11/11/2010; February 2016   a) TM STRESS----NORMAL PERFUSION ,EF 68%; b) EF 50-55%. Normal LV function. No significant ischemia or  infarction.  . ORIF ELBOW FRACTURE Right 01/01/2017   Procedure: OPEN REDUCTION INTERNAL FIXATION (ORIF) ELBOW/OLECRANON FRACTURE;  Surgeon: Hessie Knows, MD;  Location: [facility] ORS;  Service: Orthopedics;  Laterality: Right;  . ORIF WRIST FRACTURE Right 03/19/2015   Procedure: OPEN REDUCTION INTERNAL FIXATION (ORIF) WRIST FRACTURE;  Surgeon: Hessie Knows, MD;  Location: [facility] ORS;  Service: Orthopedics;  Laterality: Right;  . THORACIC AORTA - CAROTID ANGIOGRAM  October 2007   Dr. 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 TRANSTHORACIC ECHOCARDIOGRAM  February 2016   [facility]: Normal LV function. Dilated left atrium.    There were no vitals filed for this visit.  Subjective Assessment - 10/15/18 1005    Subjective  Patient reports he went shopping the other day and he got tired and had to sit down due to being fatigued from the prolonged walking. Has been going to the gym with his aide and feels comfortable.  Has been having intermittent foot and ankle pain    Pertinent History  Patient goes 5x/week for radiation therapy for prostate cancer. Is getting weaker and no longer goes to the senior center. Just started using cane, when walks a lot his low back and legs ache. He has a significant CV history including quadruple bypass, CAD w cardiac stents, on Eliquis, left carotid endarterectomy, AAA. He is functionally independent but lives alone, his partner lives 5 minutes down the road and seems to assist him with daily living. Activity as tolerated per patient report. Has history of memory deficits.     Limitations  Walking;Standing;House hold activities;Other (comment);Lifting    How long can you sit comfortably?  n/a    How long can you stand comfortably?  Reports that he has not used his cane in about 1 month    How long can you walk comfortably?  200-300 yards    Patient Stated Goals  Pt would still like to work on his  balance    Currently in Pain?  No/denies          TREATMENT  Therapeutic Exercise: 5# ankle weights -seated LAQ, x15 each; verbal cues for velocity of movement for increased muscle recruitment -standing hip extension x10 each; verbal cueing for upright posture and gluteal activation,  -standing hip abduction x20 each; verbal cues for keeping toes forward   -standing marches x20 each leg, verbal cueing for slowing velocity   7lb dumbbells in each hand: 10x sit to stand modified squats.   Seated adduction squeezes 10x 3 seconds  Seated hamstring stretch 2x 30 seconds to decrease low back pain  Neuromuscular Re-education: Airex:             Narrow BOS, with vertical  and horizontal head turns x10 each, no UE support CGA, VCs for ankle strategy to prevent posterior LOB              Eyes closed, CGA 2x 30 seconds, vc's for ankle stability strategies              6" step toe taps 10x each LE with SUE support. 6" step toe taps 10x each LE without UE support.   6" step seated toe taps for coordination and muscle recruitment. 2x 30 seconds  Balloon horizontal passes with CGA 2x 86 ft   Patient Response to interventions: Patient continues to fatigue easily, but demonstrates and articulates the implementation of balance strategies, as well as appropriate safety compensations.                      PT Education - 10/15/18 1006    Education provided  Yes    Education Details  exercise technique, prolonged mobility     Person(s) Educated  Patient    Methods  Explanation;Demonstration;Tactile cues;Verbal cues    Comprehension  Verbalized understanding;Returned demonstration;Verbal cues required;Tactile cues required;Need further instruction       PT Short Term Goals - 10/05/18 1406      PT SHORT TERM GOAL #1   Title  Patient will be independent in home exercise program to improve strength/mobility for better  functional independence with ADLs.    Baseline  9/17: HEP given 11/4: compliant at this time 2/4: compliant    Time  2    Period  Weeks    Status  Achieved      PT SHORT TERM GOAL #2   Title  Patient will deny any falls over past 2 weeks to demonstrate improved safety awareness at home and work.     Baseline  9/17: progressive weakness 11/4: denies falls last 2 weeks    Time  2    Period  Weeks    Status  Achieved      PT SHORT TERM GOAL #3   Title  Patient will increase BLE gross strength to 4-/5 as to improve functional strength for independent gait, increased standing tolerance and increased ADL ability.    Baseline  9/17: Hip flex: L 3/5 R 3+/5 abd: 2+/5, 3-/5; add 2/5 2+/5 11/4: hip flexion L 3+/5 R 3+/5 abd 2+/5, 2+/5 add 2+/5, 2+/5 2/4: 4-/5     Time  2    Period  Weeks    Status  Achieved        PT Long Term Goals - 10/05/18 1404      PT LONG TERM GOAL #1   Title  Patient will increase BLE gross strength to 4+/5 as to improve functional strength for independent gait, increased standing tolerance and increased ADL ability.    Baseline  9/17: Hip flex: L 3/5 R 3+/5 abd: 2+/5, 3-/5; add 2/5 2+/5 11/4: hip flexion L 3+/5 R 3+/5 abd 2+/5, 2+/5 add 2+/5, 2+/5 2/4: 4-/5    Time  4    Period  Weeks    Status  Partially Met    Target Date  11/02/18      PT LONG TERM GOAL #2   Title  Patient will increase ABC scale score >80% to demonstrate better functional mobility and better confidence with ADLs.     Baseline  9/17: 68.13% 11/4: 76.6% 2/4: 82.5%     Time  8    Period  Weeks    Status  Achieved      PT LONG TERM GOAL #3   Title  Patient will increase Berg Balance score by > 6 points (44/56)  to demonstrate decreased fall risk during functional activities.    Baseline  9/17: 38/56 11/4: 39/56 12/12: 43/56    Time  4    Period  Weeks    Status  Partially Met    Target Date  11/02/18      PT LONG TERM GOAL #4   Title  Patient will increase six minute walk test distance  to >1000 for progression to community ambulator and improve gait ability    Baseline  9/18: 1300 ft w/o cane 11/4: 934f  12/12: 840 ft without cane 2/4: 870 ft without cane     Time  4    Period  Weeks    Status  On-going    Target Date  11/02/18      PT LONG TERM GOAL #5   Title  Patient (> 643years old) will complete five times sit to stand test in < 15 seconds without UE support indicating an increased LE strength and improved balance.    Baseline  11/4: requires 1 UE support 12/12:  18 seconds no UE support 2/4: 17 seconds no UE support     Time  4    Period  Weeks    Status  Partially Met    Target Date  11/02/18            Plan - 10/15/18 1039    Clinical Impression Statement  Patient presents with good motivation to today's session. Patient is challenged with prolonged capacity for mobility, fatiguing quickly with standing interventions. Pt continues to demosntrate mild bradkinesia and instability but does show conitnued improvement. Patient will benefit from continued skilled therapeutic intervention to improve strength and balance for increased safety and improved overall QOL    Rehab Potential  Fair    Clinical Impairments Affecting Rehab Potential  (+) good support system, high prior level of function (-) currently undergoing radiation therapy for prostate cancer, memory deficits     PT Frequency  1x / week    PT Duration  4 weeks    PT Treatment/Interventions  ADLs/Self Care Home Management;Aquatic Therapy;Cryotherapy;Moist Heat;Traction;Ultrasound;Therapeutic activities;Functional mobility training;Stair training;Gait training;DME Instruction;Therapeutic exercise;Balance training;Neuromuscular re-education;Patient/family education;Manual techniques;Passive range of motion;Energy conservation;Vestibular;Taping    PT Next Visit Plan  strength, balance; address back pain as needed.      PT Home Exercise Plan  see above    Consulted and Agree with Plan of Care  Patient        Patient will benefit from skilled therapeutic intervention in order to improve the following deficits and impairments:  Abnormal gait, Cardiopulmonary status limiting activity, Decreased activity tolerance, Decreased balance, Decreased knowledge of precautions, Decreased endurance, Decreased coordination, Decreased cognition, Decreased knowledge of use of DME, Decreased mobility, Difficulty walking, Decreased safety awareness, Decreased strength, Impaired flexibility, Impaired perceived functional ability, Impaired sensation, Postural dysfunction, Improper body mechanics, Pain  Visit Diagnosis: Unsteadiness on feet  Muscle weakness (generalized)  Other abnormalities of gait and mobility  Difficulty in walking, not elsewhere classified     Problem List Patient Active Problem List   Diagnosis Date Noted  . Do not intubate, cardiopulmonary resuscitation (CPR)-only code status 10/08/2018  . Prostate cancer ([place] 02/25/2018  . Prostatic cyst 01/15/2018  . 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 ([place] 09/15/2017  . Blood in stool   . Abnormal feces   . Stricture and  stenosis of esophagus   . Mild cognitive impairment 08/18/2017  . Senile purpura ([place]) 04/29/2017  . Anemia 04/29/2017  . Frequent falls 04/29/2017  . Benign localized hyperplasia of prostate with urinary obstruction 07/15/2016  . Elevated PSA 07/15/2016  . Incomplete emptying of bladder 07/15/2016  . Urge incontinence 07/15/2016  . Hypothyroidism 04/25/2016  . Macular degeneration 04/25/2016  . Erectile dysfunction due to arterial insufficiency   . Ischemic heart disease with chronotropic incompetence   . CN (constipation) 02/10/2015  . DD (diverticular disease) 02/10/2015  . Fatigue 02/10/2015  . Lichen planus 32/08/2481  . Restless leg 02/10/2015  . 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 B12 deficiency 02/10/2015  . COPD (chronic obstructive pulmonary disease) ([place]) 11/14/2014  . Post Inflammatory Lung Changes  11/14/2014  . PAF (paroxysmal atrial fibrillation) ([facility]) CHA2DS2-VASc = 4. AC = Eliquis   . Insomnia 12/09/2013  . OSA on CPAP   . Dyspnea on exertion - -essentially resolved with BB dose reduction & wgt loss 03/29/2013  . Overweight (BMI 25.0-29.9) -- Wgt back up 01/17/2013  . Atherosclerotic heart disease of native coronary artery without angina pectoris - s/p CABG, occluded SVG-D1; patent LIMA-LAD, SVG-OM, SVG- RCA   . Essential hypertension   . Hyperlipidemia with target LDL less than 70   . Aorto-iliac disease ([place])   . BCC (basal cell carcinoma), eyelid 01/03/2013  . Allergic rhinitis 08/17/2009  . Adaptation reaction 05/28/2009  . Acid reflux 05/28/2009  . Arthritis, degenerative 05/28/2009  . 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 Carotid artery occlusion without infarction 06/01/2006  . PAD (peripheral artery disease) - bilateral common iliac stents 12/15/2005  . Aortoiliac occlusive disease ([place]) 11/30/2005  . Atherosclerotic heart disease of artery bypass graft 09/01/1997   Janna Arch, PT, DPT   10/15/2018, 12:05 PM  [place] MAIN [facility] SERVICES [address] [place], [place], [address] Phone: [phone]   Fax:  [phone]  Name: KELIJAH TOWRY MRN: [id number] Date of Birth: [birth year]

## 2018-10-19 ENCOUNTER — Telehealth: Payer: Self-pay | Admitting: *Deleted

## 2018-10-19 ENCOUNTER — Other Ambulatory Visit: Payer: Self-pay | Admitting: Family Medicine

## 2018-10-19 ENCOUNTER — Ambulatory Visit: Payer: Medicare Other

## 2018-10-19 DIAGNOSIS — R2681 Unsteadiness on feet: Secondary | ICD-10-CM | POA: Diagnosis not present

## 2018-10-19 DIAGNOSIS — I10 Essential (primary) hypertension: Secondary | ICD-10-CM

## 2018-10-19 DIAGNOSIS — E785 Hyperlipidemia, unspecified: Secondary | ICD-10-CM

## 2018-10-19 DIAGNOSIS — E663 Overweight: Secondary | ICD-10-CM

## 2018-10-19 DIAGNOSIS — M6281 Muscle weakness (generalized): Secondary | ICD-10-CM

## 2018-10-19 DIAGNOSIS — R262 Difficulty in walking, not elsewhere classified: Secondary | ICD-10-CM

## 2018-10-19 DIAGNOSIS — R2689 Other abnormalities of gait and mobility: Secondary | ICD-10-CM

## 2018-10-19 NOTE — Telephone Encounter (Signed)
-----   Message from Leonie Man, MD sent at 10/17/2018 11:02 PM EST ----- Cholesterol levels look pretty good.  Triglycerides are up just a little bit.  LDL is down to 44 from 54. This shows the fact that he is gotten back into exercise, and probably lost some weight during the prostate cancer treatment.  For now continue current dose of rosuvastatin.  When rechecked in 1 year, if still at this level, we can probably stop Zetia.  [name], MD

## 2018-10-19 NOTE — Therapy (Signed)
[place] MAIN [facility] SERVICES [address] [place], [place], [address] Phone: [phone]   Fax:  [phone]  Physical Therapy Treatment  Patient Details  Name: [name] MRN: [id number] Date of Birth: [birth year] No data recorded  Encounter Date: 10/19/2018  PT End of Session - 10/19/18 1256    Visit Number  33    Number of Visits  35    Date for PT Re-Evaluation  11/02/18    Authorization Type  3/10    PT Start Time  1300    PT Stop Time  1344    PT Time Calculation (min)  44 min    Equipment Utilized During Treatment  Gait belt    Activity Tolerance  Patient tolerated treatment well    Behavior During Therapy  WFL for tasks assessed/performed       Past Medical History:  Diagnosis Date  . Arthritis   . Atherosclerotic heart disease of native coronary artery without angina pectoris 1998   - s/p CABG, occluded SVG-D1; patent LIMA-LAD, SVG-OM, SVG- RCA  . Basal cell carcinoma of eyelid 01/03/2013   2019 - R side of Nose  . Benign neoplasm of ascending colon   . Benign neoplasm of descending colon   . Cancer [facility])    Skin cancer  . Colon polyps   . COPD (chronic obstructive pulmonary disease) ([place])   . GERD (gastroesophageal reflux disease)   . Hemorrhoid 02/10/2015  . History of hiatal hernia   . HOH (hard of hearing)   . Hypertension   . Memory loss   . Osteoporosis   . PAF (paroxysmal atrial fibrillation) ([place])   . Prostate disorder   . Sciatic pain    Chronic  . Sleep apnea    cpap  . Temporary cerebral vascular dysfunction 02/10/2015   Had negative work-up.  July, 2012.  No medication changes, had forgot them that day, got dehydrated.     Past Surgical History:  Procedure Laterality Date  . ANKLE SURGERY    . Arch aortogram and carotid aortogram  06/02/2006   Dr Oneida Alar did surgery  . BASAL CELL CARCINOMA EXCISION  04/2016   Dermatology  . CARDIAC CATHETERIZATION  01/30/1998    (Dr. Linard Millers): Native LAD  & mRCA CTO. 90% D1. Mod Cx. SVG-D1 CTO. SVG-RCA & SVG-OM along with LIMA-LAD patent;  LV NORMAL Fxn  . CAROTID ENDARTERECTOMY Left Oct. 29, 2007   Dr. Oneida Alar:  . CATARACT EXTRACTION W/PHACO Right 03/05/2017   Procedure: CATARACT EXTRACTION PHACO AND INTRAOCULAR LENS PLACEMENT (IOC);  Surgeon: Leandrew Koyanagi, MD;  Location: [facility] ORS;  Service: Ophthalmology;  Laterality: Right;  [place] 00:58.5 AP% 10.6 CDE 6.20 Fluid Pack lot # [id number]  . CATARACT EXTRACTION W/PHACO Left 04/15/2017   Procedure: CATARACT EXTRACTION PHACO AND INTRAOCULAR LENS PLACEMENT ([place])  Left;  Surgeon: Leandrew Koyanagi, MD;  Location: [place];  Service: Ophthalmology;  Laterality: Left;  . COLONOSCOPY WITH PROPOFOL N/A 09/15/2017   Procedure: COLONOSCOPY WITH PROPOFOL;  Surgeon: Lucilla Lame, MD;  Location: [facility] ENDOSCOPY;  Service: Endoscopy;  Laterality: N/A;  . CORONARY ARTERY BYPASS GRAFT  1998   LIMA-LAD, SVG-OM, SVG-RPDA, SVG-DIAG  . CPET / MET  12/2012   Mild chronotropic incompetence - read 82% of predicted; also reduced effort; peak VO2 15.7 / 75% (did not reach Max effort) -- suggested ischemic response in last 1.5 minutes of exercise. Normal pulmonary function on PFTs but poor response to  . DOPPLER ECHOCARDIOGRAPHY  11/20/2010   EF =>55%; LV norm mild aortic scelorosis  . ESOPHAGOGASTRODUODENOSCOPY (EGD) WITH PROPOFOL N/A 09/15/2017   Procedure: ESOPHAGOGASTRODUODENOSCOPY (EGD) WITH PROPOFOL;  Surgeon: Lucilla Lame, MD;  Location: [facility] ENDOSCOPY;  Service: Endoscopy;  Laterality: N/A;  . FRACTURE SURGERY Right 03/19/2015   wrist  . HIATAL HERNIA REPAIR    . ILIAC ARTERY STENT  12/15/2005   PTA and direct stenting rgt and lft common iliac arteries  . NM GATED MYOVIEW ([place] [facility])  12/2017   Low Risk - no ischemia or infarct.  EF > 65%. no RWMA  . NM MYOVIEW ([place] [facility])  11/11/2010; February 2016   a) TM STRESS----NORMAL PERFUSION ,EF 68%; b) EF 50-55%. Normal LV function. No significant ischemia or  infarction.  . ORIF ELBOW FRACTURE Right 01/01/2017   Procedure: OPEN REDUCTION INTERNAL FIXATION (ORIF) ELBOW/OLECRANON FRACTURE;  Surgeon: Hessie Knows, MD;  Location: [facility] ORS;  Service: Orthopedics;  Laterality: Right;  . ORIF WRIST FRACTURE Right 03/19/2015   Procedure: OPEN REDUCTION INTERNAL FIXATION (ORIF) WRIST FRACTURE;  Surgeon: Hessie Knows, MD;  Location: [facility] ORS;  Service: Orthopedics;  Laterality: Right;  . THORACIC AORTA - CAROTID ANGIOGRAM  October 2007   Dr. 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 TRANSTHORACIC ECHOCARDIOGRAM  February 2016   [facility]: Normal LV function. Dilated left atrium.    There were no vitals filed for this visit.  Subjective Assessment - 10/19/18 1304    Subjective  Patient reports he had a fall on Saturday. Stood up from chair and turned to his right to get to his countertop. [place] backwards hurting his right arm an dpotentially hitting his head. Reports no dizziness or headaches. No sensitivity to light. No extra fatigue.     Pertinent History  Patient goes 5x/week for radiation therapy for prostate cancer. Is getting weaker and no longer goes to the senior center. Just started using cane, when walks a lot his low back and legs ache. He has a significant CV history including quadruple bypass, CAD w cardiac stents, on Eliquis, left carotid endarterectomy, AAA. He is functionally independent but lives alone, his partner lives 5 minutes down the road and seems to assist him with daily living. Activity as tolerated per patient report. Has history of memory deficits.     Limitations  Walking;Standing;House hold activities;Other (comment);Lifting    How long can you sit comfortably?  n/a    How long can you stand comfortably?  Reports that he has not used his cane in about 1 month    How long can you walk comfortably?  200-300 yards    Patient Stated Goals  Pt would still like  to work on his balance    Currently in Pain?  No/denies           Patient has bandages on his R arm from his fall.     TREATMENT   Therapeutic Exercise:  5# ankle weights             -standing hip extension x12 each; verbal cueing for upright posture and gluteal activation,              -standing hip abduction x12 each; verbal cues for keeping toes forward              -standing marches x12 each leg, verbal cueing for slowing velocity   Nustep Lvl 3 only LE's 4 minutes.   SPT from chair to  plinth table to L side to replicate motion that caused patient to fall 5x.        7lb dumbbells in each hand: 10x sit to stand modified squats.         Seated adduction squeezes 10x 3 seconds   Seated hamstring stretch 2x 30 seconds each LE to decrease low back pain   Neuromuscular Re-education: Airex:             Narrow BOS, with vertical and horizontal head turns x30 seconds each each, no UE support CGA, VCs for ankle strategy to prevent posterior LOB               Eyes closed, CGA 2x 30 seconds, vc's for ankle stability strategies               6" step toe taps 10x each LE with SUE support. 6" step toe taps 10x each LE without UE support.    One foot each airex pad in modified tandem stance 2x 30 seconds each LE.    Ambulate in // bars with CGa 4x length of bars   6" step seated toe taps for coordination and muscle recruitment. 2x 30 seconds      Patient Response to interventions: Patient continues to fatigue easily, but demonstrates and articulates the implementation of balance strategies, as well as appropriate safety compensations.                       PT Education - 10/19/18 1255    Education provided  Yes    Education Details  exercise technique, stability     Person(s) Educated  Patient    Methods  Explanation;Demonstration;Tactile cues;Verbal cues    Comprehension  Verbalized understanding;Returned demonstration;Tactile cues required;Need further  instruction       PT Short Term Goals - 10/05/18 1406      PT SHORT TERM GOAL #1   Title  Patient will be independent in home exercise program to improve strength/mobility for better functional independence with ADLs.    Baseline  9/17: HEP given 11/4: compliant at this time 2/4: compliant    Time  2    Period  Weeks    Status  Achieved      PT SHORT TERM GOAL #2   Title  Patient will deny any falls over past 2 weeks to demonstrate improved safety awareness at home and work.     Baseline  9/17: progressive weakness 11/4: denies falls last 2 weeks    Time  2    Period  Weeks    Status  Achieved      PT SHORT TERM GOAL #3   Title  Patient will increase BLE gross strength to 4-/5 as to improve functional strength for independent gait, increased standing tolerance and increased ADL ability.    Baseline  9/17: Hip flex: L 3/5 R 3+/5 abd: 2+/5, 3-/5; add 2/5 2+/5 11/4: hip flexion L 3+/5 R 3+/5 abd 2+/5, 2+/5 add 2+/5, 2+/5 2/4: 4-/5     Time  2    Period  Weeks    Status  Achieved        PT Long Term Goals - 10/05/18 1404      PT LONG TERM GOAL #1   Title  Patient will increase BLE gross strength to 4+/5 as to improve functional strength for independent gait, increased standing tolerance and increased ADL ability.    Baseline  9/17: Hip flex: L 3/5 R  3+/5 abd: 2+/5, 3-/5; add 2/5 2+/5 11/4: hip flexion L 3+/5 R 3+/5 abd 2+/5, 2+/5 add 2+/5, 2+/5 2/4: 4-/5    Time  4    Period  Weeks    Status  Partially Met    Target Date  11/02/18      PT LONG TERM GOAL #2   Title  Patient will increase ABC scale score >80% to demonstrate better functional mobility and better confidence with ADLs.     Baseline  9/17: 68.13% 11/4: 76.6% 2/4: 82.5%     Time  8    Period  Weeks    Status  Achieved      PT LONG TERM GOAL #3   Title  Patient will increase Berg Balance score by > 6 points (44/56)  to demonstrate decreased fall risk during functional activities.    Baseline  9/17: 38/56 11/4:  39/56 12/12: 43/56    Time  4    Period  Weeks    Status  Partially Met    Target Date  11/02/18      PT LONG TERM GOAL #4   Title  Patient will increase six minute walk test distance to >1000 for progression to community ambulator and improve gait ability    Baseline  9/18: 1300 ft w/o cane 11/4: 944f  12/12: 840 ft without cane 2/4: 870 ft without cane     Time  4    Period  Weeks    Status  On-going    Target Date  11/02/18      PT LONG TERM GOAL #5   Title  Patient (> 660years old) will complete five times sit to stand test in < 15 seconds without UE support indicating an increased LE strength and improved balance.    Baseline  11/4: requires 1 UE support 12/12:  18 seconds no UE support 2/4: 17 seconds no UE support     Time  4    Period  Weeks    Status  Partially Met    Target Date  11/02/18            Plan - 10/19/18 1320    Clinical Impression Statement  Patient had a fall last week and is still very bruised on his R arm. Despite rescent fall patient demonstrates good motivation throughout session. Patient demonstrates good ankle reactions when performing dynamic stability interventions on unstable surface. Patient will benefit from continued skilled therapeutic intervention to improve strength and balance for increased safety and improved overall QOL    Rehab Potential  Fair    Clinical Impairments Affecting Rehab Potential  (+) good support system, high prior level of function (-) currently undergoing radiation therapy for prostate cancer, memory deficits     PT Frequency  1x / week    PT Duration  4 weeks    PT Treatment/Interventions  ADLs/Self Care Home Management;Aquatic Therapy;Cryotherapy;Moist Heat;Traction;Ultrasound;Therapeutic activities;Functional mobility training;Stair training;Gait training;DME Instruction;Therapeutic exercise;Balance training;Neuromuscular re-education;Patient/family education;Manual techniques;Passive range of motion;Energy  conservation;Vestibular;Taping    PT Next Visit Plan  strength, balance; address back pain as needed.      PT Home Exercise Plan  see above    Consulted and Agree with Plan of Care  Patient       Patient will benefit from skilled therapeutic intervention in order to improve the following deficits and impairments:  Abnormal gait, Cardiopulmonary status limiting activity, Decreased activity tolerance, Decreased balance, Decreased knowledge of precautions, Decreased endurance, Decreased coordination, Decreased cognition, Decreased  knowledge of use of DME, Decreased mobility, Difficulty walking, Decreased safety awareness, Decreased strength, Impaired flexibility, Impaired perceived functional ability, Impaired sensation, Postural dysfunction, Improper body mechanics, Pain  Visit Diagnosis: Unsteadiness on feet  Muscle weakness (generalized)  Other abnormalities of gait and mobility  Difficulty in walking, not elsewhere classified     Problem List Patient Active Problem List   Diagnosis Date Noted  . Do not intubate, cardiopulmonary resuscitation (CPR)-only code status 10/08/2018  . Prostate cancer ([place]) 02/25/2018  . Prostatic cyst 01/15/2018  . 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 ([place]) 09/15/2017  . Blood in stool   . Abnormal feces   . Stricture and stenosis of esophagus   . Mild cognitive impairment 08/18/2017  . Senile purpura ([place]) 04/29/2017  . Anemia 04/29/2017  . Frequent falls 04/29/2017  . Benign localized hyperplasia of prostate with urinary obstruction 07/15/2016  . Elevated PSA 07/15/2016  . Incomplete emptying of bladder 07/15/2016  . Urge incontinence 07/15/2016  . Hypothyroidism 04/25/2016  . Macular degeneration 04/25/2016  . Erectile dysfunction due to arterial insufficiency   . Ischemic heart disease with chronotropic incompetence   . CN (constipation) 02/10/2015  . DD (diverticular disease) 02/10/2015  . Fatigue 02/10/2015  . Lichen planus 06/25/8526  .  Restless leg 02/10/2015  . 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 B12 deficiency 02/10/2015  . COPD (chronic obstructive pulmonary disease) ([place]) 11/14/2014  . Post Inflammatory Lung Changes 11/14/2014  . PAF (paroxysmal atrial fibrillation) (HCC) CHA2DS2-VASc = 4. AC = Eliquis   . Insomnia 12/09/2013  . OSA on CPAP   . Dyspnea on exertion - -essentially resolved with BB dose reduction & wgt loss 03/29/2013  . Overweight (BMI 25.0-29.9) -- Wgt back up 01/17/2013  . Atherosclerotic heart disease of native coronary artery without angina pectoris - s/p CABG, occluded SVG-D1; patent LIMA-LAD, SVG-OM, SVG- RCA   . Essential hypertension   . Hyperlipidemia with target LDL less than 70   . Aorto-iliac disease ([place])   . BCC (basal cell carcinoma), eyelid 01/03/2013  . Allergic rhinitis 08/17/2009  . Adaptation reaction 05/28/2009  . Acid reflux 05/28/2009  . Arthritis, degenerative 05/28/2009  . 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 Carotid artery occlusion without infarction 06/01/2006  . PAD (peripheral artery disease) - bilateral common iliac stents 12/15/2005  . Aortoiliac occlusive disease ([place]) 11/30/2005  . Atherosclerotic heart disease of artery bypass graft 09/01/1997   Janna Arch, PT, DPT   10/19/2018, 1:45 PM  [place] MAIN [facility] SERVICES [address] [place], [place], [address] Phone: [phone]   Fax:  [phone]  Name: KARLIN HEILMAN MRN: [id number] Date of Birth: 30-Mar-1934

## 2018-10-19 NOTE — Telephone Encounter (Signed)
The patient and laura has been notified of the result and verbalized understanding.  All questions (if any) were answered. Raiford Simmonds, RN 10/19/2018 4:26 PM   RECHECK IN ONE YEAR -ORDER  LAURA STATES PATIENT IS  ALREADY TAKING ZETIA 10 MG

## 2018-10-22 ENCOUNTER — Ambulatory Visit: Payer: Medicare Other | Admitting: Urology

## 2018-10-26 ENCOUNTER — Ambulatory Visit: Payer: Medicare Other

## 2018-10-26 ENCOUNTER — Ambulatory Visit
Admission: RE | Admit: 2018-10-26 | Discharge: 2018-10-26 | Disposition: A | Discharge status: Home / Self Care | Payer: Medicare Other | Attending: Gastroenterology | Admitting: Gastroenterology

## 2018-10-26 ENCOUNTER — Encounter: Payer: Self-pay | Admitting: Student

## 2018-10-26 ENCOUNTER — Ambulatory Visit: Payer: Medicare Other | Admitting: Registered Nurse

## 2018-10-26 ENCOUNTER — Other Ambulatory Visit: Payer: Self-pay

## 2018-10-26 ENCOUNTER — Encounter
Admission: RE | Disposition: A | Discharge status: Home / Self Care | Payer: Self-pay | Source: Home / Self Care | Attending: Gastroenterology

## 2018-10-26 DIAGNOSIS — C186 Malignant neoplasm of descending colon: Secondary | ICD-10-CM

## 2018-10-26 DIAGNOSIS — Z7901 Long term (current) use of anticoagulants: Secondary | ICD-10-CM | POA: Diagnosis not present

## 2018-10-26 DIAGNOSIS — C61 Malignant neoplasm of prostate: Secondary | ICD-10-CM | POA: Insufficient documentation

## 2018-10-26 DIAGNOSIS — I739 Peripheral vascular disease, unspecified: Secondary | ICD-10-CM | POA: Diagnosis not present

## 2018-10-26 DIAGNOSIS — Z7951 Long term (current) use of inhaled steroids: Secondary | ICD-10-CM | POA: Insufficient documentation

## 2018-10-26 DIAGNOSIS — E039 Hypothyroidism, unspecified: Secondary | ICD-10-CM | POA: Diagnosis not present

## 2018-10-26 DIAGNOSIS — Z951 Presence of aortocoronary bypass graft: Secondary | ICD-10-CM | POA: Diagnosis not present

## 2018-10-26 DIAGNOSIS — D010 Carcinoma in situ of colon: Secondary | ICD-10-CM | POA: Diagnosis not present

## 2018-10-26 DIAGNOSIS — G4730 Sleep apnea, unspecified: Secondary | ICD-10-CM | POA: Diagnosis not present

## 2018-10-26 DIAGNOSIS — I480 Paroxysmal atrial fibrillation: Secondary | ICD-10-CM | POA: Diagnosis not present

## 2018-10-26 DIAGNOSIS — I10 Essential (primary) hypertension: Secondary | ICD-10-CM | POA: Insufficient documentation

## 2018-10-26 DIAGNOSIS — D123 Benign neoplasm of transverse colon: Secondary | ICD-10-CM | POA: Insufficient documentation

## 2018-10-26 DIAGNOSIS — K640 First degree hemorrhoids: Secondary | ICD-10-CM | POA: Insufficient documentation

## 2018-10-26 DIAGNOSIS — Z79899 Other long term (current) drug therapy: Secondary | ICD-10-CM | POA: Insufficient documentation

## 2018-10-26 DIAGNOSIS — K5730 Diverticulosis of large intestine without perforation or abscess without bleeding: Secondary | ICD-10-CM | POA: Diagnosis not present

## 2018-10-26 DIAGNOSIS — Z87891 Personal history of nicotine dependence: Secondary | ICD-10-CM | POA: Diagnosis not present

## 2018-10-26 DIAGNOSIS — Z86010 Personal history of colonic polyps: Secondary | ICD-10-CM | POA: Diagnosis not present

## 2018-10-26 DIAGNOSIS — Z1211 Encounter for screening for malignant neoplasm of colon: Secondary | ICD-10-CM | POA: Diagnosis not present

## 2018-10-26 DIAGNOSIS — I2510 Atherosclerotic heart disease of native coronary artery without angina pectoris: Secondary | ICD-10-CM | POA: Insufficient documentation

## 2018-10-26 DIAGNOSIS — Z85828 Personal history of other malignant neoplasm of skin: Secondary | ICD-10-CM | POA: Insufficient documentation

## 2018-10-26 DIAGNOSIS — K219 Gastro-esophageal reflux disease without esophagitis: Secondary | ICD-10-CM | POA: Diagnosis not present

## 2018-10-26 DIAGNOSIS — D124 Benign neoplasm of descending colon: Secondary | ICD-10-CM | POA: Insufficient documentation

## 2018-10-26 DIAGNOSIS — J449 Chronic obstructive pulmonary disease, unspecified: Secondary | ICD-10-CM | POA: Insufficient documentation

## 2018-10-26 DIAGNOSIS — D126 Benign neoplasm of colon, unspecified: Secondary | ICD-10-CM | POA: Diagnosis not present

## 2018-10-26 DIAGNOSIS — K635 Polyp of colon: Secondary | ICD-10-CM | POA: Diagnosis not present

## 2018-10-26 DIAGNOSIS — G4733 Obstructive sleep apnea (adult) (pediatric): Secondary | ICD-10-CM | POA: Diagnosis not present

## 2018-10-26 HISTORY — DX: Presence of automatic (implantable) cardiac defibrillator: Z95.810

## 2018-10-26 HISTORY — PX: COLONOSCOPY WITH PROPOFOL: SHX5780

## 2018-10-26 SURGERY — COLONOSCOPY WITH PROPOFOL
Anesthesia: General

## 2018-10-26 MED ORDER — SPOT INK MARKER SYRINGE KIT
PACK | SUBMUCOSAL | Status: DC | PRN
  Administered 2018-10-26: 1.5 mL via SUBMUCOSAL

## 2018-10-26 MED ORDER — LIDOCAINE HCL (CARDIAC) PF 100 MG/5ML IV SOSY
PREFILLED_SYRINGE | INTRAVENOUS | Status: DC | PRN
  Administered 2018-10-26: 80 mg via INTRATRACHEAL

## 2018-10-26 MED ORDER — SPOT INK MARKER SYRINGE KIT
PACK | SUBMUCOSAL | Status: DC | PRN
  Administered 2018-10-26: 3 mL via SUBMUCOSAL

## 2018-10-26 MED ORDER — PROPOFOL 500 MG/50ML IV EMUL
INTRAVENOUS | Status: AC
  Filled 2018-10-26: qty 50

## 2018-10-26 MED ORDER — LIDOCAINE HCL (PF) 2 % IJ SOLN
INTRAMUSCULAR | Status: AC
  Filled 2018-10-26: qty 10

## 2018-10-26 MED ORDER — SODIUM CHLORIDE 0.9 % IV SOLN
INTRAVENOUS | Status: AC | PRN
  Administered 2018-10-26: 5 mL via INTRAMUSCULAR

## 2018-10-26 MED ORDER — PROPOFOL 10 MG/ML IV BOLUS
INTRAVENOUS | Status: DC | PRN
  Administered 2018-10-26: 45 mg via INTRAVENOUS

## 2018-10-26 MED ORDER — SODIUM CHLORIDE 0.9 % IV SOLN
INTRAVENOUS | Status: DC
  Administered 2018-10-26: 1000 mL via INTRAVENOUS
  Administered 2018-10-26: 09:00:00 via INTRAVENOUS

## 2018-10-26 MED ORDER — PROPOFOL 500 MG/50ML IV EMUL
INTRAVENOUS | Status: DC | PRN
  Administered 2018-10-26: 75 ug/kg/min via INTRAVENOUS

## 2018-10-26 NOTE — Transfer of Care (Signed)
Immediate Anesthesia Transfer of Care Note  Patient: [name]  Procedure(s) Performed: COLONOSCOPY WITH PROPOFOL (N/A )  Patient Location: PACU  Anesthesia Type:General  Level of Consciousness: awake  Airway & Oxygen Therapy: Patient Spontanous Breathing  Post-op Assessment: Report given to RN  Post vital signs: stable  Last Vitals:  Vitals Value Taken Time  BP 96/79 10/26/2018  9:46 AM  Temp 36.4 C 10/26/2018  9:40 AM  Pulse 83 10/26/2018  9:46 AM  Resp 22 10/26/2018  9:46 AM  SpO2 95 % 10/26/2018  9:46 AM  Vitals shown include unvalidated device data.  Last Pain:  Vitals:   10/26/18 0940  TempSrc: Tympanic  PainSc:          Complications: No apparent anesthesia complications

## 2018-10-26 NOTE — Anesthesia Post-op Follow-up Note (Signed)
Anesthesia QCDR form completed.        

## 2018-10-26 NOTE — Anesthesia Preprocedure Evaluation (Signed)
Anesthesia Evaluation  Patient identified by MRN, date of birth, ID band Patient awake    Reviewed: Allergy & Precautions, H&P , NPO status , Patient's Chart, lab work & pertinent test results, reviewed documented beta blocker date and time   Airway Mallampati: II   Neck ROM: full    Dental  (+) Poor Dentition   Pulmonary sleep apnea and Continuous Positive Airway Pressure Ventilation , COPD,  COPD inhaler, former smoker,    Pulmonary exam normal        Cardiovascular Exercise Tolerance: Poor hypertension, On Medications + CAD and + Peripheral Vascular Disease  Normal cardiovascular exam+ Cardiac Defibrillator  Rhythm:regular Rate:Normal     Neuro/Psych PSYCHIATRIC DISORDERS  Neuromuscular disease    GI/Hepatic Neg liver ROS, hiatal hernia, GERD  ,  Endo/Other  Hypothyroidism   Renal/GU negative Renal ROS  negative genitourinary   Musculoskeletal   Abdominal   Peds  Hematology  (+) Blood dyscrasia, anemia ,   Anesthesia Other Findings Past Medical History: No date: AICD (automatic cardioverter/defibrillator) present No date: Arthritis 1998: Atherosclerotic heart disease of native coronary artery without  angina pectoris     Comment:  - s/p CABG, occluded SVG-D1; patent LIMA-LAD, SVG-OM,               SVG- RCA 01/03/2013: Basal cell carcinoma of eyelid     Comment:  2019 - R side of Nose No date: Benign neoplasm of ascending colon No date: Benign neoplasm of descending colon No date: Cancer ([place])     Comment:  Skin cancer No date: Colon polyps No date: COPD (chronic obstructive pulmonary disease) (HCC) No date: GERD (gastroesophageal reflux disease) 02/10/2015: Hemorrhoid No date: History of hiatal hernia No date: HOH (hard of hearing) No date: Hypertension No date: Memory loss No date: Osteoporosis No date: PAF (paroxysmal atrial fibrillation) (HCC) No date: Prostate disorder No date: Sciatic pain      Comment:  Chronic No date: Sleep apnea     Comment:  cpap 02/10/2015: Temporary cerebral vascular dysfunction     Comment:  Had negative work-up.  July, 2012.  No medication               changes, had forgot them that day, got dehydrated.  Past Surgical History: No date: ANKLE SURGERY 06/02/2006: Arch aortogram and carotid aortogram     Comment:  Dr Oneida Alar did surgery 04/2016: BASAL CELL CARCINOMA EXCISION     Comment:  Dermatology 01/30/1998: CARDIAC CATHETERIZATION     Comment:   (Dr. Linard Millers): Native LAD & mRCA CTO. 90% D1. Mod Cx.               SVG-D1 CTO. SVG-RCA & SVG-OM along with LIMA-LAD patent;               LV NORMAL Fxn Oct. 29, 2007: CAROTID ENDARTERECTOMY; Left     Comment:  Dr. Oneida Alar: 03/05/2017: CATARACT EXTRACTION W/PHACO; Right     Comment:  Procedure: CATARACT EXTRACTION PHACO AND INTRAOCULAR               LENS PLACEMENT ([place]);  Surgeon: Leandrew Koyanagi, MD;              Location: [facility] ORS;  Service: Ophthalmology;  Laterality:              Right;  [place] 00:58.5 AP% 10.6 CDE 6.20 Fluid Pack lot #               [id number] 04/15/2017: CATARACT  EXTRACTION W/PHACO; Left     Comment:  Procedure: CATARACT EXTRACTION PHACO AND INTRAOCULAR               LENS PLACEMENT ([place])  Left;  Surgeon: Leandrew Koyanagi, MD;  Location: [place];  Service:               Ophthalmology;  Laterality: Left; 09/15/2017: COLONOSCOPY WITH PROPOFOL; N/A     Comment:  Procedure: COLONOSCOPY WITH PROPOFOL;  Surgeon: Lucilla Lame, MD;  Location: [facility] ENDOSCOPY;  Service:               Endoscopy;  Laterality: N/A; 1998: CORONARY ARTERY BYPASS GRAFT     Comment:  LIMA-LAD, SVG-OM, SVG-RPDA, SVG-DIAG 12/2012: CPET / MET     Comment:  Mild chronotropic incompetence - read 82% of predicted;               also reduced effort; peak VO2 15.7 / 75% (did not reach               Max effort) -- suggested ischemic response in last 1.5               minutes of  exercise. Normal pulmonary function on PFTs               but poor response to 11/20/2010: DOPPLER ECHOCARDIOGRAPHY     Comment:  EF =>55%; LV norm mild aortic scelorosis 09/15/2017: ESOPHAGOGASTRODUODENOSCOPY (EGD) WITH PROPOFOL; N/A     Comment:  Procedure: ESOPHAGOGASTRODUODENOSCOPY (EGD) WITH               PROPOFOL;  Surgeon: Lucilla Lame, MD;  Location: [facility]               ENDOSCOPY;  Service: Endoscopy;  Laterality: N/A; 03/19/2015: FRACTURE SURGERY; Right     Comment:  wrist No date: HIATAL HERNIA REPAIR 12/15/2005: ILIAC ARTERY STENT     Comment:  PTA and direct stenting rgt and lft common iliac               arteries 12/2017: NM GATED MYOVIEW ([place] [facility])     Comment:  Low Risk - no ischemia or infarct.  EF > 65%. no RWMA 11/11/2010; February 2016: NM MYOVIEW [facility])     Comment:  a) TM STRESS----NORMAL PERFUSION ,EF 68%; b) EF 50-55%.               Normal LV function. No significant ischemia or               infarction. 01/01/2017: ORIF ELBOW FRACTURE; Right     Comment:  Procedure: OPEN REDUCTION INTERNAL FIXATION (ORIF)               ELBOW/OLECRANON FRACTURE;  Surgeon: Hessie Knows, MD;                Location: [facility] ORS;  Service: Orthopedics;  Laterality:               Right; 03/19/2015: ORIF WRIST FRACTURE; Right     Comment:  Procedure: OPEN REDUCTION INTERNAL FIXATION (ORIF) WRIST              FRACTURE;  Surgeon: Hessie Knows, MD;  Location: [facility]               ORS;  Service:  Orthopedics;  Laterality: Right; October 2007: THORACIC AORTA - CAROTID ANGIOGRAM     Comment:  Dr. 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 Dilated left atrium. BMI    Body Mass Index:  30.11 kg/m     Reproductive/Obstetrics negative OB ROS                              Anesthesia Physical Anesthesia Plan  ASA: IV  Anesthesia Plan: General   Post-op Pain Management:    Induction:   PONV Risk Score and Plan:   Airway Management Planned:   Additional Equipment:   Intra-op Plan:   Post-operative Plan:   Informed Consent: I have reviewed the patients History and Physical, chart, labs and discussed the procedure including the risks, benefits and alternatives for the proposed anesthesia with the patient or authorized representative who has indicated his/her understanding and acceptance.     Dental Advisory Given  Plan Discussed with: CRNA  Anesthesia Plan Comments:         Anesthesia Quick Evaluation

## 2018-10-26 NOTE — Interval H&P Note (Signed)
History and Physical Interval Note:  10/26/2018 9:01 AM  [name]  has presented today for surgery, with the diagnosis of Hx of colon polyps Z86.010  The various methods of treatment have been discussed with the patient and family. After consideration of risks, benefits and other options for treatment, the patient has consented to  Procedure(s): COLONOSCOPY WITH PROPOFOL (N/A) as a surgical intervention .  The patient's history has been reviewed, patient examined, no change in status, stable for surgery.  I have reviewed the patient's chart and labs.  Questions were answered to the patient's satisfaction.     Deandra Gadson [employer]

## 2018-10-26 NOTE — Op Note (Addendum)
[facility] Patient Name: [name] Procedure Date: 10/26/2018 8:58 AM MRN: [id number] Account #: [id number] Date of Birth: 1933-11-15 Admit Type: Outpatient Age: 85 Room: [facility] ENDO ROOM 4 Gender: Male Note Status: Finalized Procedure:            Colonoscopy Indications:          Follow-up for history of colon polyps with carcinoma in                        situ Providers:            Lucilla Lame MD, MD Referring MD:         Kirstie Peri. Caryn Section, MD (Referring MD) Medicines:            Propofol per Anesthesia Complications:        No immediate complications. Procedure:            Pre-Anesthesia Assessment:                       - Prior to the procedure, a History and Physical was                        performed, and patient medications and allergies were                        reviewed. The patient's tolerance of previous                        anesthesia was also reviewed. The risks and benefits of                        the procedure and the sedation options and risks were                        discussed with the patient. All questions were                        answered, and informed consent was obtained. Prior                        Anticoagulants: The patient has taken no previous                        anticoagulant or antiplatelet agents. ASA Grade                        Assessment: III - A patient with severe systemic                        disease. After reviewing the risks and benefits, the                        patient was deemed in satisfactory condition to undergo                        the procedure.                       After obtaining informed consent, the colonoscope was  passed under direct vision. Throughout the procedure,                        the patient's blood pressure, pulse, and oxygen                        saturations were monitored continuously. The                        Colonoscope was  introduced through the anus and                        advanced to the the cecum, identified by appendiceal                        orifice and ileocecal valve. The colonoscopy was                        performed without difficulty. The patient tolerated the                        procedure well. The quality of the bowel preparation                        was excellent. Findings:      The perianal and digital rectal examinations were normal.      Multiple small-mouthed diverticula were found in the entire colon.      A 4 mm polyp was found in the transverse colon. The polyp was sessile.       The polyp was removed with a cold snare. Resection and retrieval were       complete.      Three sessile polyps were found in the descending colon. The polyps were       4 to 5 mm in size. Area was tattooed with an injection of [place] ink.       These polyps were removed with a cold snare. Resection and retrieval       were complete. Coagulation for destruction of remaining portion of       lesion using snare was successful. To prevent bleeding       post-intervention, one hemostatic clip was successfully placed (MR       conditional). There was no bleeding at the end of the procedure.      Non-bleeding internal hemorrhoids were found during retroflexion. The       hemorrhoids were Grade I (internal hemorrhoids that do not prolapse). Impression:           - Diverticulosis in the entire examined colon.                       - One 4 mm polyp in the transverse colon, removed with                        a cold snare. Resected and retrieved.                       - Three 4 to 5 mm polyps in the descending colon,                        removed with a cold snare.  Resected and retrieved.                        Tattooed. Treated with a hot snare. Clip (MR                        conditional) was placed.                       - Non-bleeding internal hemorrhoids. Recommendation:       - Discharge patient to  home.                       - Resume previous diet.                       - Continue present medications.                       - Await pathology results. Procedure Code(s):    --- Professional ---                       [phone], Colonoscopy, flexible; with removal of tumor(s),                        polyp(s), or other lesion(s) by snare technique                       [id number], Colonoscopy, flexible; with directed submucosal                        injection(s), any substance Diagnosis Code(s):    --- Professional ---                       V91.660, Personal history of in-situ neoplasm of other                        site                       D12.4, Benign neoplasm of descending colon                       D12.3, Benign neoplasm of transverse colon (hepatic                        flexure or splenic flexure) CPT copyright 2018 American Medical Association. All rights reserved. The codes documented in this report are preliminary and upon coder review may  be revised to meet current compliance requirements. Lucilla Lame MD, MD 10/26/2018 9:44:47 AM This report has been signed electronically. Number of Addenda: 0 Note Initiated On: 10/26/2018 8:58 AM Scope Withdrawal Time: 0 hours 18 minutes 58 seconds  Total Procedure Duration: 0 hours 26 minutes 9 seconds       [facility]

## 2018-10-27 ENCOUNTER — Telehealth: Payer: Self-pay | Admitting: Gastroenterology

## 2018-10-27 ENCOUNTER — Encounter: Payer: Self-pay | Admitting: Gastroenterology

## 2018-10-27 NOTE — Telephone Encounter (Signed)
Pt's wife notified per Dr. Allen Norris he has not heard of any bleeding after a colonoscopy within the urine. Mrs Kenton Kingfisher asked if they should keep the urology appt since he had stopped bleeding. I advised her I felt it needed to be evaluated as he should not have blood in his urine.

## 2018-10-27 NOTE — Telephone Encounter (Signed)
[name] called stated patient had Colonoscopy done 10-26-2018 by Dr Allen Norris has woken up this morning urinating blood. He has a hx of Prostate Ca. She has called his Urologist and they are going to see him on 10-28-2018,think maybe the procedure bother his prostate.   Please call.

## 2018-10-27 NOTE — Telephone Encounter (Signed)
Never heard of this happening. But he does have prostate cancer.

## 2018-10-28 ENCOUNTER — Ambulatory Visit: Payer: Medicare Other | Admitting: Urology

## 2018-10-28 LAB — SURGICAL PATHOLOGY

## 2018-10-29 ENCOUNTER — Telehealth: Payer: Self-pay

## 2018-10-29 NOTE — Telephone Encounter (Signed)
-----   Message from Lucilla Lame, MD sent at 10/28/2018 12:00 PM EST ----- Let the patient know that the biopsies of the area did not show any further signs of cancer and I would not recommend any other intervention at this time.

## 2018-10-29 NOTE — Telephone Encounter (Signed)
LVM for pt to return my call.

## 2018-11-01 NOTE — Telephone Encounter (Signed)
Called pt's wife back today and results were given of Colonoscopy results.

## 2018-11-01 NOTE — Telephone Encounter (Signed)
PT WIFE IS CALLING FOR GINGER

## 2018-11-02 ENCOUNTER — Ambulatory Visit: Payer: Medicare Other | Attending: Radiation Oncology

## 2018-11-02 DIAGNOSIS — R2681 Unsteadiness on feet: Secondary | ICD-10-CM

## 2018-11-02 DIAGNOSIS — M6281 Muscle weakness (generalized): Secondary | ICD-10-CM

## 2018-11-02 DIAGNOSIS — R262 Difficulty in walking, not elsewhere classified: Secondary | ICD-10-CM | POA: Diagnosis not present

## 2018-11-02 DIAGNOSIS — R2689 Other abnormalities of gait and mobility: Secondary | ICD-10-CM

## 2018-11-02 NOTE — Therapy (Addendum)
[place] MAIN [facility] 2 SERVICES [address] [place], [place], [address] Phone: [phone]   Fax:  [phone]  Physical Therapy Treatment/Re-Certification  Patient Details  Name: [name] MRN: [id number] Date of Birth: [birth year] No data recorded  Encounter Date: 11/02/2018  PT End of Session - 11/02/18 1437    Visit Number  34    Number of Visits  39    Date for PT Re-Evaluation  11/30/18    Authorization Type  4/10     PT Start Time  6962    PT Stop Time  1342    PT Time Calculation (min)  46 min    Equipment Utilized During Treatment  Gait belt    Activity Tolerance  Patient tolerated treatment well    Behavior During Therapy  WFL for tasks assessed/performed       Past Medical History:  Diagnosis Date  . AICD (automatic cardioverter/defibrillator) present   . Arthritis   . Atherosclerotic heart disease of native coronary artery without angina pectoris 1998   - s/p CABG, occluded SVG-D1; patent LIMA-LAD, SVG-OM, SVG- RCA  . Basal cell carcinoma of eyelid 01/03/2013   2019 - R side of Nose  . Benign neoplasm of ascending colon   . Benign neoplasm of descending colon   . Cancer [facility])    Skin cancer  . Colon polyps   . COPD (chronic obstructive pulmonary disease) ([place])   . GERD (gastroesophageal reflux disease)   . Hemorrhoid 02/10/2015  . History of hiatal hernia   . HOH (hard of hearing)   . Hypertension   . Memory loss   . Osteoporosis   . PAF (paroxysmal atrial fibrillation) ([place])   . Prostate disorder   . Sciatic pain    Chronic  . Sleep apnea    cpap  . Temporary cerebral vascular dysfunction 02/10/2015   Had negative work-up.  July, 2012.  No medication changes, had forgot them that day, got dehydrated.     Past Surgical History:  Procedure Laterality Date  . ANKLE SURGERY    . Arch aortogram and carotid aortogram  06/02/2006   Dr Oneida Alar did surgery  . BASAL CELL CARCINOMA EXCISION  04/2016   Dermatology  . CARDIAC CATHETERIZATION  01/30/1998    (Dr. Linard Millers): Native LAD & mRCA CTO. 90% D1. Mod Cx. SVG-D1 CTO. SVG-RCA & SVG-OM along with LIMA-LAD patent;  LV NORMAL Fxn  . CAROTID ENDARTERECTOMY Left Oct. 29, 2007   Dr. Oneida Alar:  . CATARACT EXTRACTION W/PHACO Right 03/05/2017   Procedure: CATARACT EXTRACTION PHACO AND INTRAOCULAR LENS PLACEMENT (IOC);  Surgeon: Leandrew Koyanagi, MD;  Location: [facility] ORS;  Service: Ophthalmology;  Laterality: Right;  [place] 00:58.5 AP% 10.6 CDE 6.20 Fluid Pack lot # [id number]  . CATARACT EXTRACTION W/PHACO Left 04/15/2017   Procedure: CATARACT EXTRACTION PHACO AND INTRAOCULAR LENS PLACEMENT ([place])  Left;  Surgeon: Leandrew Koyanagi, MD;  Location: [place];  Service: Ophthalmology;  Laterality: Left;  . COLONOSCOPY WITH PROPOFOL N/A 09/15/2017   Procedure: COLONOSCOPY WITH PROPOFOL;  Surgeon: Lucilla Lame, MD;  Location: [facility] ENDOSCOPY;  Service: Endoscopy;  Laterality: N/A;  . COLONOSCOPY WITH PROPOFOL N/A 10/26/2018   Procedure: COLONOSCOPY WITH PROPOFOL;  Surgeon: Lucilla Lame, MD;  Location: [facility] ENDOSCOPY;  Service: Endoscopy;  Laterality: N/A;  . CORONARY ARTERY BYPASS GRAFT  1998   LIMA-LAD, SVG-OM, SVG-RPDA, SVG-DIAG  . CPET / MET  12/2012   Mild chronotropic incompetence - read 82% of predicted; also reduced  effort; peak VO2 15.7 / 75% (did not reach Max effort) -- suggested ischemic response in last 1.5 minutes of exercise. Normal pulmonary function on PFTs but poor response to  . DOPPLER ECHOCARDIOGRAPHY  11/20/2010   EF =>55%; LV norm mild aortic scelorosis  . ESOPHAGOGASTRODUODENOSCOPY (EGD) WITH PROPOFOL N/A 09/15/2017   Procedure: ESOPHAGOGASTRODUODENOSCOPY (EGD) WITH PROPOFOL;  Surgeon: Lucilla Lame, MD;  Location: [facility] ENDOSCOPY;  Service: Endoscopy;  Laterality: N/A;  . FRACTURE SURGERY Right 03/19/2015   wrist  . HIATAL HERNIA REPAIR    . ILIAC ARTERY STENT  12/15/2005   PTA and direct stenting rgt and lft common iliac  arteries  . NM GATED MYOVIEW ([place] HX)  12/2017   Low Risk - no ischemia or infarct.  EF > 65%. no RWMA  . NM MYOVIEW ([place] HX)  11/11/2010; February 2016   a) TM STRESS----NORMAL PERFUSION ,EF 68%; b) EF 50-55%. Normal LV function. No significant ischemia or infarction.  . ORIF ELBOW FRACTURE Right 01/01/2017   Procedure: OPEN REDUCTION INTERNAL FIXATION (ORIF) ELBOW/OLECRANON FRACTURE;  Surgeon: Hessie Knows, MD;  Location: [facility] ORS;  Service: Orthopedics;  Laterality: Right;  . ORIF WRIST FRACTURE Right 03/19/2015   Procedure: OPEN REDUCTION INTERNAL FIXATION (ORIF) WRIST FRACTURE;  Surgeon: Hessie Knows, MD;  Location: [facility] ORS;  Service: Orthopedics;  Laterality: Right;  . THORACIC AORTA - CAROTID ANGIOGRAM  October 2007   Dr. 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 TRANSTHORACIC ECHOCARDIOGRAM  February 2016   [facility]: Normal LV function. Dilated left atrium.    There were no vitals filed for this visit.   Subjective Assessment - 11/02/18 1435    Subjective  Patient reports that he is doing well today. He denies falls last visit, but mentions an occasional stumble. He has been consistently going to the gym with his trainers and feels that it has greatly helped with his strength.    Pertinent History  Patient goes 5x/week for radiation therapy for prostate cancer. Is getting weaker and no longer goes to the senior center. Just started using cane, when walks a lot his low back and legs ache. He has a significant CV history including quadruple bypass, CAD w cardiac stents, on Eliquis, left carotid endarterectomy, AAA. He is functionally independent but lives alone, his partner lives 5 minutes down the road and seems to assist him with daily living. Activity as tolerated per patient report. Has history of memory deficits.     Limitations  Walking;Standing;House hold activities;Other (comment);Lifting    How  long can you sit comfortably?  n/a    How long can you stand comfortably?  Reports that he has not used his cane in about 1 month    How long can you walk comfortably?  200-300 yards    Patient Stated Goals  Pt would still like to work on his balance    Currently in Pain?  No/denies       Re-Assessment and review of patient's goals completed this session:  BLE strength: Hip flexion: 4+/5 bilaterally (greatly improved) Adduction (sitting): 5/5 bilaterally (greatly improved) Abduction (sitting): 5/5 bilaterally (greatly improved)  ABC: 83.44% (very slight improvement)  BERG =51/56; (improved)   5x STS: SUE 16 seconds; no UE support 17 seconds (comparable to last assessment)  6 MWT= 815 ft with two standing rest breaks; upon completion, patient fatigued and O2 saturation at 90%, instructed in performance of pursed lip breathing; patient returns to >  95% following short duration of rest and performance of pursed lip breathing, distracted by presence of family, stopping to talk briefly    DGI = 14/24 (**new assessment; score of <19/24 indicates increased risk for falls, approximately 41-60% impairment)                               PT Education - 11/02/18 1436    Education provided  Yes    Education Details  POC, progress, stability, mobility    Person(s) Educated  Patient    Methods  Explanation;Demonstration;Tactile cues;Verbal cues    Comprehension  Verbalized understanding;Returned demonstration;Verbal cues required;Tactile cues required;Need further instruction       PT Short Term Goals - 11/02/18 1449      PT SHORT TERM GOAL #1   Title  Patient will be independent in home exercise program to improve strength/mobility for better functional independence with ADLs.    Baseline  9/17: HEP given 11/4: compliant at this time 2/4: compliant    Time  2    Period  Weeks    Status  Achieved      PT SHORT TERM GOAL #2   Title  Patient will deny any falls  over past 2 weeks to demonstrate improved safety awareness at home and work.     Baseline  9/17: progressive weakness 11/4: denies falls last 2 weeks    Time  2    Period  Weeks    Status  Achieved      PT SHORT TERM GOAL #3   Title  Patient will increase BLE gross strength to 4-/5 as to improve functional strength for independent gait, increased standing tolerance and increased ADL ability.    Baseline  9/17: Hip flex: L 3/5 R 3+/5 abd: 2+/5, 3-/5; add 2/5 2+/5 11/4: hip flexion L 3+/5 R 3+/5 abd 2+/5, 2+/5 add 2+/5, 2+/5 2/4: 4-/5     Time  2    Period  Weeks    Status  Achieved        PT Long Term Goals - 11/02/18 1450      PT LONG TERM GOAL #1   Title  Patient will increase BLE gross strength to 4+/5 as to improve functional strength for independent gait, increased standing tolerance and increased ADL ability.    Baseline  9/17: Hip flex: L 3/5 R 3+/5 abd: 2+/5, 3-/5; add 2/5 2+/5 11/4: hip flexion L 3+/5 R 3+/5 abd 2+/5, 2+/5 add 2+/5, 2+/5 2/4: 4-/5; 11/02/2018: Flex 4+/5 bilaterally, abd 5/5 bilaterally (sitting), add 5/5 bilaterally (sitting)    Time  4    Period  Weeks    Status  Achieved      PT LONG TERM GOAL #2   Title  Patient will increase ABC scale score >80% to demonstrate better functional mobility and better confidence with ADLs.     Baseline  9/17: 68.13% 11/4: 76.6% 2/4: 82.5%, 3/3: 83.44%    Time  8    Period  Weeks    Status  Achieved      PT LONG TERM GOAL #3   Title  Patient will increase Berg Balance score by > 6 points (44/56)  to demonstrate decreased fall risk during functional activities.    Baseline  9/17: 38/56 11/4: 39/56 12/12: 43/56, 11/02/2018: 51/56    Time  4    Period  Weeks    Status  Achieved  PT LONG TERM GOAL #4   Title  Patient will increase six minute walk test distance to >1000 for progression to community ambulator and improve gait ability    Baseline  9/18: 1300 ft w/o cane 11/4: 938f  12/12: 840 ft without cane 2/4: 870 ft  without cane, 815 ft without cane    Time  4    Period  Weeks    Status  On-going    Target Date  11/30/18      PT LONG TERM GOAL #5   Title  Patient (> 834years old) will complete five times sit to stand test in < 15 seconds without UE support indicating an increased LE strength and improved balance.    Baseline  11/4: requires 1 UE support 12/12:  18 seconds no UE support 2/4: 17 seconds no UE support; 3/3: 16 sec 1 UE, 17 sec no UE    Time  4    Period  Weeks    Status  Partially Met    Target Date  11/30/18      Additional Long Term Goals   Additional Long Term Goals  Yes      PT LONG TERM GOAL #6   Title  Patient will improve score on DGI to > or = 20/24 on the DGI to increase dynamic balance during mobility and to decrease risk of falls.    Baseline  11/02/18: 14/24    Time  4    Period  Weeks    Status  New    Target Date  11/30/18            Plan - 11/02/18 1505    Clinical Impression Statement  The patient's goals were re-assessed this session, and patient demonstrates tremendous improvement in BLE strength and mild improvement with ABC score (confidence with movement/balance) and BERG. The patient reports that he has improved tremendously in terms of strength and balance with PT, but indicates that moving quickly, changing directions when walking, and balance upon initial standing from sitting continue to be difficult for him. The Dynamic Gait Index (DGI) was performed this session and confirmed the patient's increased difficulty with walking balance, as patient scored 14/24, which puts him at an increased risk for falls. Patient's condition has the potential to improve in response to therapy. At this point, maximum improvement is yet to be obtained. The anticipated improvement is attainable and reasonable in a generally predictable time. The patient will continue to benefit from skilled PT in order to work towards goals, to continue to improve BLE strength, to improve both  static and dynamic balance, and to maximize independence and safety with functional mobility.     Rehab Potential  Fair    Clinical Impairments Affecting Rehab Potential  (+) good support system, high prior level of function (-) currently undergoing radiation therapy for prostate cancer, memory deficits     PT Frequency  1x / week    PT Duration  4 weeks    PT Treatment/Interventions  ADLs/Self Care Home Management;Aquatic Therapy;Cryotherapy;Moist Heat;Traction;Ultrasound;Therapeutic activities;Functional mobility training;Stair training;Gait training;DME Instruction;Therapeutic exercise;Balance training;Neuromuscular re-education;Patient/family education;Manual techniques;Passive range of motion;Energy conservation;Vestibular;Taping    PT Next Visit Plan  strength, balance; dynamic balance, balance with ambulation    PT Home Exercise Plan  see above    Consulted and Agree with Plan of Care  Patient       Patient will benefit from skilled therapeutic intervention in order to improve the following deficits and impairments:  Abnormal  gait, Cardiopulmonary status limiting activity, Decreased activity tolerance, Decreased balance, Decreased knowledge of precautions, Decreased endurance, Decreased coordination, Decreased cognition, Decreased knowledge of use of DME, Decreased mobility, Difficulty walking, Decreased safety awareness, Decreased strength, Impaired flexibility, Impaired perceived functional ability, Impaired sensation, Postural dysfunction, Improper body mechanics, Pain  Visit Diagnosis: Unsteadiness on feet  Muscle weakness (generalized)  Other abnormalities of gait and mobility  Difficulty in walking, not elsewhere classified     Problem List Patient Active Problem List   Diagnosis Date Noted  . Malignant neoplasm of descending colon ([place])   . Benign neoplasm of descending colon   . Benign neoplasm of transverse colon   . Do not intubate, cardiopulmonary resuscitation  (CPR)-only code status 10/08/2018  . Prostate cancer ([place]) 02/25/2018  . Prostatic cyst 01/15/2018  . 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 ([place]) 09/15/2017  . Blood in stool   . Abnormal feces   . Stricture and stenosis of esophagus   . Mild cognitive impairment 08/18/2017  . Senile purpura ([place]) 04/29/2017  . Anemia 04/29/2017  . Frequent falls 04/29/2017  . Benign localized hyperplasia of prostate with urinary obstruction 07/15/2016  . Elevated PSA 07/15/2016  . Incomplete emptying of bladder 07/15/2016  . Urge incontinence 07/15/2016  . Hypothyroidism 04/25/2016  . Macular degeneration 04/25/2016  . Erectile dysfunction due to arterial insufficiency   . Ischemic heart disease with chronotropic incompetence   . CN (constipation) 02/10/2015  . DD (diverticular disease) 02/10/2015  . Fatigue 02/10/2015  . Lichen planus 05/39/7673  . Restless leg 02/10/2015  . 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 B12 deficiency 02/10/2015  . COPD (chronic obstructive pulmonary disease) ([place]) 11/14/2014  . Post Inflammatory Lung Changes 11/14/2014  . PAF (paroxysmal atrial fibrillation) ([facility]) CHA2DS2-VASc = 4. AC = Eliquis   . Insomnia 12/09/2013  . OSA on CPAP   . Dyspnea on exertion - -essentially resolved with BB dose reduction & wgt loss 03/29/2013  . Overweight (BMI 25.0-29.9) -- Wgt back up 01/17/2013  . Atherosclerotic heart disease of native coronary artery without angina pectoris - s/p CABG, occluded SVG-D1; patent LIMA-LAD, SVG-OM, SVG- RCA   . Essential hypertension   . Hyperlipidemia with target LDL less than 70   . Aorto-iliac disease ([place])   . BCC (basal cell carcinoma), eyelid 01/03/2013  . Allergic rhinitis 08/17/2009  . Adaptation reaction 05/28/2009  . Acid reflux 05/28/2009  . Arthritis, degenerative 05/28/2009  . 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 Carotid artery occlusion without infarction 06/01/2006  . PAD (peripheral artery disease) - bilateral common iliac  stents 12/15/2005  . Aortoiliac occlusive disease ([place]) 11/30/2005  . Atherosclerotic heart disease of artery bypass graft 09/01/1997   Orlean Patten, SPT  This entire session was performed under direct supervision and direction of a licensed therapist/therapist assistant . I have personally read, edited and approve of the note as written.  Janna Arch, PT, DPT    11/02/2018, 3:39 PM  [place] MAIN [facility] SERVICES [address] [place], [place], [address] Phone: [phone]   Fax:  [phone]  Name: [name] MRN: [id number] Date of Birth: [birth year]

## 2018-11-03 NOTE — Anesthesia Postprocedure Evaluation (Signed)
Anesthesia Post Note  Patient: [name]  Procedure(s) Performed: COLONOSCOPY WITH PROPOFOL (N/A )  Patient location during evaluation: PACU Anesthesia Type: General Level of consciousness: awake and alert Pain management: pain level controlled Vital Signs Assessment: post-procedure vital signs reviewed and stable Respiratory status: spontaneous breathing, nonlabored ventilation, respiratory function stable and patient connected to nasal cannula oxygen Cardiovascular status: blood pressure returned to baseline and stable Postop Assessment: no apparent nausea or vomiting Anesthetic complications: no     Last Vitals:  Vitals:   10/26/18 1000 10/26/18 1010  BP: 110/82 (!) 156/89  Pulse: 70   Resp: 13   Temp: (!) 36.2 C (!) 36.3 C  SpO2: 97%     Last Pain:  Vitals:   10/26/18 1010  TempSrc: Tympanic  PainSc: 0-No pain                 Molli Barrows

## 2018-11-09 ENCOUNTER — Ambulatory Visit: Payer: Medicare Other

## 2018-11-09 ENCOUNTER — Other Ambulatory Visit: Payer: Self-pay | Admitting: Cardiology

## 2018-11-09 DIAGNOSIS — M6281 Muscle weakness (generalized): Secondary | ICD-10-CM

## 2018-11-09 DIAGNOSIS — R2681 Unsteadiness on feet: Secondary | ICD-10-CM | POA: Diagnosis not present

## 2018-11-09 DIAGNOSIS — R262 Difficulty in walking, not elsewhere classified: Secondary | ICD-10-CM

## 2018-11-09 DIAGNOSIS — R2689 Other abnormalities of gait and mobility: Secondary | ICD-10-CM

## 2018-11-09 NOTE — Therapy (Signed)
[place] MAIN [facility] SERVICES [address] [place], [place], [address] Phone: [phone]   Fax:  [phone]  Physical Therapy Treatment  Patient Details  Name: [name] MRN: [id number] Date of Birth: [birth year] No data recorded  Encounter Date: 11/09/2018  PT End of Session - 11/09/18 1323    Visit Number  35    Number of Visits  39    Date for PT Re-Evaluation  11/30/18    Authorization Type  5/10     PT Start Time  1329    PT Stop Time  1414    PT Time Calculation (min)  45 min    Equipment Utilized During Treatment  Gait belt    Activity Tolerance  Patient tolerated treatment well    Behavior During Therapy  WFL for tasks assessed/performed       Past Medical History:  Diagnosis Date  . AICD (automatic cardioverter/defibrillator) present   . Arthritis   . Atherosclerotic heart disease of native coronary artery without angina pectoris 1998   - s/p CABG, occluded SVG-D1; patent LIMA-LAD, SVG-OM, SVG- RCA  . Basal cell carcinoma of eyelid 01/03/2013   2019 - R side of Nose  . Benign neoplasm of ascending colon   . Benign neoplasm of descending colon   . Cancer [facility])    Skin cancer  . Colon polyps   . COPD (chronic obstructive pulmonary disease) ([place])   . GERD (gastroesophageal reflux disease)   . Hemorrhoid 02/10/2015  . History of hiatal hernia   . HOH (hard of hearing)   . Hypertension   . Memory loss   . Osteoporosis   . PAF (paroxysmal atrial fibrillation) ([place])   . Prostate disorder   . Sciatic pain    Chronic  . Sleep apnea    cpap  . Temporary cerebral vascular dysfunction 02/10/2015   Had negative work-up.  July, 2012.  No medication changes, had forgot them that day, got dehydrated.     Past Surgical History:  Procedure Laterality Date  . ANKLE SURGERY    . Arch aortogram and carotid aortogram  06/02/2006   Dr Oneida Alar did surgery  . BASAL CELL CARCINOMA EXCISION  04/2016   Dermatology  . CARDIAC  CATHETERIZATION  01/30/1998    (Dr. Linard Millers): Native LAD & mRCA CTO. 90% D1. Mod Cx. SVG-D1 CTO. SVG-RCA & SVG-OM along with LIMA-LAD patent;  LV NORMAL Fxn  . CAROTID ENDARTERECTOMY Left Oct. 29, 2007   Dr. Oneida Alar:  . CATARACT EXTRACTION W/PHACO Right 03/05/2017   Procedure: CATARACT EXTRACTION PHACO AND INTRAOCULAR LENS PLACEMENT (IOC);  Surgeon: Leandrew Koyanagi, MD;  Location: [facility] ORS;  Service: Ophthalmology;  Laterality: Right;  [place] 00:58.5 AP% 10.6 CDE 6.20 Fluid Pack lot # [id number]  . CATARACT EXTRACTION W/PHACO Left 04/15/2017   Procedure: CATARACT EXTRACTION PHACO AND INTRAOCULAR LENS PLACEMENT ([place])  Left;  Surgeon: Leandrew Koyanagi, MD;  Location: [place];  Service: Ophthalmology;  Laterality: Left;  . COLONOSCOPY WITH PROPOFOL N/A 09/15/2017   Procedure: COLONOSCOPY WITH PROPOFOL;  Surgeon: Lucilla Lame, MD;  Location: [facility] ENDOSCOPY;  Service: Endoscopy;  Laterality: N/A;  . COLONOSCOPY WITH PROPOFOL N/A 10/26/2018   Procedure: COLONOSCOPY WITH PROPOFOL;  Surgeon: Lucilla Lame, MD;  Location: [facility] END[facility]COPY;  Service: Endoscopy;  Laterality: N/A;  . CORONARY ARTERY BYPASS GRAFT  1998   LIMA-LAD, SVG-OM, SVG-RPDA, SVG-DIAG  . CPET / MET  12/2012   Mild chronotropic incompetence - read 82% of predicted; also  reduced effort; peak VO2 15.7 / 75% (did not reach Max effort) -- suggested ischemic response in last 1.5 minutes of exercise. Normal pulmonary function on PFTs but poor response to  . DOPPLER ECHOCARDIOGRAPHY  11/20/2010   EF =>55%; LV norm mild aortic scelorosis  . ESOPHAGOGASTRODUODENOSCOPY (EGD) WITH PROPOFOL N/A 09/15/2017   Procedure: ESOPHAGOGASTRODUODENOSCOPY (EGD) WITH PROPOFOL;  Surgeon: Lucilla Lame, MD;  Location: [facility] ENDOSCOPY;  Service: Endoscopy;  Laterality: N/A;  . FRACTURE SURGERY Right 03/19/2015   wrist  . HIATAL HERNIA REPAIR    . ILIAC ARTERY STENT  12/15/2005   PTA and direct stenting rgt and lft common iliac arteries  . NM GATED  MYOVIEW ([place] [facility])  12/2017   Low Risk - no ischemia or infarct.  EF > 65%. no RWMA  . NM MYOVIEW ([place] [facility])  11/11/2010; February 2016   a) TM STRESS----NORMAL PERFUSION ,EF 68%; b) EF 50-55%. Normal LV function. No significant ischemia or infarction.  . ORIF ELBOW FRACTURE Right 01/01/2017   Procedure: OPEN REDUCTION INTERNAL FIXATION (ORIF) ELBOW/OLECRANON FRACTURE;  Surgeon: Hessie Knows, MD;  Location: [facility] ORS;  Service: Orthopedics;  Laterality: Right;  . ORIF WRIST FRACTURE Right 03/19/2015   Procedure: OPEN REDUCTION INTERNAL FIXATION (ORIF) WRIST FRACTURE;  Surgeon: Hessie Knows, MD;  Location: [facility] ORS;  Service: Orthopedics;  Laterality: Right;  . THORACIC AORTA - CAROTID ANGIOGRAM  October 2007   Dr. 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 TRANSTHORACIC ECHOCARDIOGRAM  February 2016   [facility]: Normal LV function. Dilated left atrium.    There were no vitals filed for this visit.  Subjective Assessment - 11/09/18 1421    Subjective  Patient reports he is doing well today. No fall or LOB since last session. Went to the gym this morning and did a good arm workout. Has been compliant with HEP.     Pertinent History  Patient goes 5x/week for radiation therapy for prostate cancer. Is getting weaker and no longer goes to the senior center. Just started using cane, when walks a lot his low back and legs ache. He has a significant CV history including quadruple bypass, CAD w cardiac stents, on Eliquis, left carotid endarterectomy, AAA. He is functionally independent but lives alone, his partner lives 5 minutes down the road and seems to assist him with daily living. Activity as tolerated per patient report. Has history of memory deficits.     Limitations  Walking;Standing;House hold activities;Other (comment);Lifting    How long can you sit comfortably?  n/a    How long can you stand comfortably?  Reports that  he has not used his cane in about 1 month    How long can you walk comfortably?  200-300 yards    Patient Stated Goals  Pt would still like to work on his balance    Currently in Pain?  No/denies           Neuromuscular Re-education: verbal cueing for task orientation and body mechanics for stability.  Rainbow ball: vertical toss in air 2x 86 ft with visual follow Rainbow ball: ball bounce in hallway 2x 86 ft.  Horizontal head turns with ball pass in hallway 2x 86 ft Airex pad: basketball tosses reaching inside and outside BOS.  Cone taps in // bars with PT guiding color and LE, no UE support 2 minutes for coordination 4 hedgehogs : front, lateral, backwards 2 minutes each side. 2 fingertip support Half  foam roller: rocking pf/df with SUE support 2 minutes  Lateral bosu lunges 10x each LE Modified single limb stance 30 seconds each LE, challenging each LE.   Patient Response to interventions: Patient continues to fatigue easily, but demonstrates and articulates the implementation of balance strategies, as well as appropriate safety compensations.    Sp02 monitored throughout session due to sinus infection.                     PT Education - 11/09/18 1322    Education provided  Yes    Education Details  exercise technique, stability, mobility     Person(s) Educated  Patient    Methods  Explanation;Demonstration;Verbal cues;Tactile cues    Comprehension  Verbalized understanding;Returned demonstration;Verbal cues required;Need further instruction;Tactile cues required       PT Short Term Goals - 11/02/18 1449      PT SHORT TERM GOAL #1   Title  Patient will be independent in home exercise program to improve strength/mobility for better functional independence with ADLs.    Baseline  9/17: HEP given 11/4: compliant at this time 2/4: compliant    Time  2    Period  Weeks    Status  Achieved      PT SHORT TERM GOAL #2   Title  Patient will deny any falls over  past 2 weeks to demonstrate improved safety awareness at home and work.     Baseline  9/17: progressive weakness 11/4: denies falls last 2 weeks    Time  2    Period  Weeks    Status  Achieved      PT SHORT TERM GOAL #3   Title  Patient will increase BLE gross strength to 4-/5 as to improve functional strength for independent gait, increased standing tolerance and increased ADL ability.    Baseline  9/17: Hip flex: L 3/5 R 3+/5 abd: 2+/5, 3-/5; add 2/5 2+/5 11/4: hip flexion L 3+/5 R 3+/5 abd 2+/5, 2+/5 add 2+/5, 2+/5 2/4: 4-/5     Time  2    Period  Weeks    Status  Achieved        PT Long Term Goals - 11/02/18 1450      PT LONG TERM GOAL #1   Title  Patient will increase BLE gross strength to 4+/5 as to improve functional strength for independent gait, increased standing tolerance and increased ADL ability.    Baseline  9/17: Hip flex: L 3/5 R 3+/5 abd: 2+/5, 3-/5; add 2/5 2+/5 11/4: hip flexion L 3+/5 R 3+/5 abd 2+/5, 2+/5 add 2+/5, 2+/5 2/4: 4-/5; 11/02/2018: Flex 4+/5 bilaterally, abd 5/5 bilaterally (sitting), add 5/5 bilaterally (sitting)    Time  4    Period  Weeks    Status  Achieved      PT LONG TERM GOAL #2   Title  Patient will increase ABC scale score >80% to demonstrate better functional mobility and better confidence with ADLs.     Baseline  9/17: 68.13% 11/4: 76.6% 2/4: 82.5%, 3/3: 83.44%    Time  8    Period  Weeks    Status  Achieved      PT LONG TERM GOAL #3   Title  Patient will increase Berg Balance score by > 6 points (44/56)  to demonstrate decreased fall risk during functional activities.    Baseline  9/17: 38/56 11/4: 39/56 12/12: 43/56, 11/02/2018: 51/56    Time  4  Period  Weeks    Status  Achieved      PT LONG TERM GOAL #4   Title  Patient will increase six minute walk test distance to >1000 for progression to community ambulator and improve gait ability    Baseline  9/18: 1300 ft w/o cane 11/4: [id number]  12/12: 840 ft without cane 2/4: 870 ft without  cane, 815 ft without cane    Time  4    Period  Weeks    Status  On-going    Target Date  11/30/18      PT LONG TERM GOAL #5   Title  Patient (> 676years old) will complete five times sit to stand test in < 15 seconds without UE support indicating an increased LE strength and improved balance.    Baseline  11/4: requires 1 UE support 12/12:  18 seconds no UE support 2/4: 17 seconds no UE support; 3/3: 16 sec 1 UE, 17 sec no UE    Time  4    Period  Weeks    Status  Partially Met    Target Date  11/30/18      Additional Long Term Goals   Additional Long Term Goals  Yes      PT LONG TERM GOAL #6   Title  Patient will improve score on DGI to > or = 20/24 on the DGI to increase dynamic balance during mobility and to decrease risk of falls.    Baseline  11/02/18: 14/24    Time  4    Period  Weeks    Status  New    Target Date  11/30/18            Plan - 11/09/18 1422    Clinical Impression Statement  Patient presents with good motivation to today's session. He performed more complex dynamic stability tasks with ambulation without LOB however became challenged with tasks that require more significant single leg demand. Single limb stance continues to be an area of limitation at this time. Vitals monitored due to allergies/sinus resulting in breathlessness. Patient will benefit from continued skilled therapeutic intervention to improve strength and balance for increased safety and improved overall QOL    Rehab Potential  Fair    Clinical Impairments Affecting Rehab Potential  (+) good support system, high prior level of function (-) currently undergoing radiation therapy for prostate cancer, memory deficits     PT Frequency  1x / week    PT Duration  4 weeks    PT Treatment/Interventions  ADLs/Self Care Home Management;Aquatic Therapy;Cryotherapy;Moist Heat;Traction;Ultrasound;Therapeutic activities;Functional mobility training;Stair training;Gait training;DME Instruction;Therapeutic  exercise;Balance training;Neuromuscular re-education;Patient/family education;Manual techniques;Passive range of motion;Energy conservation;Vestibular;Taping    PT Next Visit Plan  strength, balance; dynamic balance, balance with ambulation    PT Home Exercise Plan  see above    Consulted and Agree with Plan of Care  Patient       Patient will benefit from skilled therapeutic intervention in order to improve the following deficits and impairments:  Abnormal gait, Cardiopulmonary status limiting activity, Decreased activity tolerance, Decreased balance, Decreased knowledge of precautions, Decreased endurance, Decreased coordination, Decreased cognition, Decreased knowledge of use of DME, Decreased mobility, Difficulty walking, Decreased safety awareness, Decreased strength, Impaired flexibility, Impaired perceived functional ability, Impaired sensation, Postural dysfunction, Improper body mechanics, Pain  Visit Diagnosis: Unsteadiness on feet  Muscle weakness (generalized)  Other abnormalities of gait and mobility  Difficulty in walking, not elsewhere classified     Problem List Patient  Active Problem List   Diagnosis Date Noted  . Malignant neoplasm of descending colon ([place])   . Benign neoplasm of descending colon   . Benign neoplasm of transverse colon   . Do not intubate, cardiopulmonary resuscitation (CPR)-only code status 10/08/2018  . Prostate cancer ([place]) 02/25/2018  . Prostatic cyst 01/15/2018  . 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 ([place]) 09/15/2017  . Blood in stool   . Abnormal feces   . Stricture and stenosis of esophagus   . Mild cognitive impairment 08/18/2017  . Senile purpura ([place]) 04/29/2017  . Anemia 04/29/2017  . Frequent falls 04/29/2017  . Benign localized hyperplasia of prostate with urinary obstruction 07/15/2016  . Elevated PSA 07/15/2016  . Incomplete emptying of bladder 07/15/2016  . Urge incontinence 07/15/2016  . Hypothyroidism 04/25/2016  . Macular  degeneration 04/25/2016  . Erectile dysfunction due to arterial insufficiency   . Ischemic heart disease with chronotropic incompetence   . CN (constipation) 02/10/2015  . DD (diverticular disease) 02/10/2015  . Fatigue 02/10/2015  . Lichen planus 08/67/6195  . Restless leg 02/10/2015  . 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 B12 deficiency 02/10/2015  . COPD (chronic obstructive pulmonary disease) ([place]) 11/14/2014  . Post Inflammatory Lung Changes 11/14/2014  . PAF (paroxysmal atrial fibrillation) ([facility]) CHA2DS2-VASc = 4. AC = Eliquis   . Insomnia 12/09/2013  . OSA on CPAP   . Dyspnea on exertion - -essentially resolved with BB dose reduction & wgt loss 03/29/2013  . Overweight (BMI 25.0-29.9) -- Wgt back up 01/17/2013  . Atherosclerotic heart disease of native coronary artery without angina pectoris - s/p CABG, occluded SVG-D1; patent LIMA-LAD, SVG-OM, SVG- RCA   . Essential hypertension   . Hyperlipidemia with target LDL less than 70   . Aorto-iliac disease ([place])   . BCC (basal cell carcinoma), eyelid 01/03/2013  . Allergic rhinitis 08/17/2009  . Adaptation reaction 05/28/2009  . Acid reflux 05/28/2009  . Arthritis, degenerative 05/28/2009  . 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 Carotid artery occlusion without infarction 06/01/2006  . PAD (peripheral artery disease) - bilateral common iliac stents 12/15/2005  . Aortoiliac occlusive disease ([place]) 11/30/2005  . Atherosclerotic heart disease of artery bypass graft 09/01/1997   Janna Arch, PT, DPT   11/09/2018, 2:23 PM  [place] MAIN [facility] SERVICES [address] [place], [place], [address] Phone: [phone]   Fax:  [phone]  Name: CEDRICK PARTAIN MRN: [id number] Date of Birth: Nov 18, 1933

## 2018-11-16 ENCOUNTER — Other Ambulatory Visit: Payer: Self-pay

## 2018-11-16 ENCOUNTER — Ambulatory Visit: Payer: Medicare Other

## 2018-11-16 DIAGNOSIS — M6281 Muscle weakness (generalized): Secondary | ICD-10-CM | POA: Diagnosis not present

## 2018-11-16 DIAGNOSIS — R2681 Unsteadiness on feet: Secondary | ICD-10-CM | POA: Diagnosis not present

## 2018-11-16 DIAGNOSIS — R262 Difficulty in walking, not elsewhere classified: Secondary | ICD-10-CM | POA: Diagnosis not present

## 2018-11-16 DIAGNOSIS — R2689 Other abnormalities of gait and mobility: Secondary | ICD-10-CM

## 2018-11-16 NOTE — Therapy (Addendum)
[place] MAIN [facility] SERVICES [address] [place], [place], [address] Phone: [phone]   Fax:  [phone]  Physical Therapy Treatment  Patient Details  Name: [name] MRN: [id number] Date of Birth: May 08, 1934 No data recorded  Encounter Date: 11/16/2018  PT End of Session - 11/16/18 1524    Visit Number  36    Number of Visits  39    Date for PT Re-Evaluation  11/30/18    Authorization Type  6/10     PT Start Time  1259    PT Stop Time  1343    PT Time Calculation (min)  44 min    Equipment Utilized During Treatment  Gait belt    Activity Tolerance  Patient tolerated treatment well    Behavior During Therapy  WFL for tasks assessed/performed       Past Medical History:  Diagnosis Date  . AICD (automatic cardioverter/defibrillator) present   . Arthritis   . Atherosclerotic heart disease of native coronary artery without angina pectoris 1998   - s/p CABG, occluded SVG-D1; patent LIMA-LAD, SVG-OM, SVG- RCA  . Basal cell carcinoma of eyelid 01/03/2013   2019 - R side of Nose  . Benign neoplasm of ascending colon   . Benign neoplasm of descending colon   . Cancer [facility])    Skin cancer  . Colon polyps   . COPD (chronic obstructive pulmonary disease) ([place])   . GERD (gastroesophageal reflux disease)   . Hemorrhoid 02/10/2015  . History of hiatal hernia   . HOH (hard of hearing)   . Hypertension   . Memory loss   . Osteoporosis   . PAF (paroxysmal atrial fibrillation) ([place])   . Prostate disorder   . Sciatic pain    Chronic  . Sleep apnea    cpap  . Temporary cerebral vascular dysfunction 02/10/2015   Had negative work-up.  July, 2012.  No medication changes, had forgot them that day, got dehydrated.     Past Surgical History:  Procedure Laterality Date  . ANKLE SURGERY    . Arch aortogram and carotid aortogram  06/02/2006   Dr Oneida Alar did surgery  . BASAL CELL CARCINOMA EXCISION  04/2016   Dermatology  . CARDIAC  CATHETERIZATION  01/30/1998    (Dr. Linard Millers): Native LAD & mRCA CTO. 90% D1. Mod Cx. SVG-D1 CTO. SVG-RCA & SVG-OM along with LIMA-LAD patent;  LV NORMAL Fxn  . CAROTID ENDARTERECTOMY Left Oct. 29, 2007   Dr. Oneida Alar:  . CATARACT EXTRACTION W/PHACO Right 03/05/2017   Procedure: CATARACT EXTRACTION PHACO AND INTRAOCULAR LENS PLACEMENT (IOC);  Surgeon: Leandrew Koyanagi, MD;  Location: [facility] ORS;  Service: Ophthalmology;  Laterality: Right;  [place] 00:58.5 AP% 10.6 CDE 6.20 Fluid Pack lot # [id number]  . CATARACT EXTRACTION W/PHACO Left 04/15/2017   Procedure: CATARACT EXTRACTION PHACO AND INTRAOCULAR LENS PLACEMENT ([place])  Left;  Surgeon: Leandrew Koyanagi, MD;  Location: [place];  Service: Ophthalmology;  Laterality: Left;  . COLONOSCOPY WITH PROPOFOL N/A 09/15/2017   Procedure: COLONOSCOPY WITH PROPOFOL;  Surgeon: Lucilla Lame, MD;  Location: [facility] ENDOSCOPY;  Service: Endoscopy;  Laterality: N/A;  . COLONOSCOPY WITH PROPOFOL N/A 10/26/2018   Procedure: COLONOSCOPY WITH PROPOFOL;  Surgeon: Lucilla Lame, MD;  Location: [facility] ENDOSCOPY;  Service: Endoscopy;  Laterality: N/A;  . CORONARY ARTERY BYPASS GRAFT  1998   LIMA-LAD, SVG-OM, SVG-RPDA, SVG-DIAG  . CPET / MET  12/2012   Mild chronotropic incompetence - read 82% of predicted; also  reduced effort; peak VO2 15.7 / 75% (did not reach Max effort) -- suggested ischemic response in last 1.5 minutes of exercise. Normal pulmonary function on PFTs but poor response to  . DOPPLER ECHOCARDIOGRAPHY  11/20/2010   EF =>55%; LV norm mild aortic scelorosis  . ESOPHAGOGASTRODUODENOSCOPY (EGD) WITH PROPOFOL N/A 09/15/2017   Procedure: ESOPHAGOGASTRODUODENOSCOPY (EGD) WITH PROPOFOL;  Surgeon: Lucilla Lame, MD;  Location: [facility] ENDOSCOPY;  Service: Endoscopy;  Laterality: N/A;  . FRACTURE SURGERY Right 03/19/2015   wrist  . HIATAL HERNIA REPAIR    . ILIAC ARTERY STENT  12/15/2005   PTA and direct stenting rgt and lft common iliac arteries  . NM GATED  MYOVIEW ([place] HX)  12/2017   Low Risk - no ischemia or infarct.  EF > 65%. no RWMA  . NM MYOVIEW ([place] HX)  11/11/2010; February 2016   a) TM STRESS----NORMAL PERFUSION ,EF 68%; b) EF 50-55%. Normal LV function. No significant ischemia or infarction.  . ORIF ELBOW FRACTURE Right 01/01/2017   Procedure: OPEN REDUCTION INTERNAL FIXATION (ORIF) ELBOW/OLECRANON FRACTURE;  Surgeon: Hessie Knows, MD;  Location: [facility] ORS;  Service: Orthopedics;  Laterality: Right;  . ORIF WRIST FRACTURE Right 03/19/2015   Procedure: OPEN REDUCTION INTERNAL FIXATION (ORIF) WRIST FRACTURE;  Surgeon: Hessie Knows, MD;  Location: [facility] ORS;  Service: Orthopedics;  Laterality: Right;  . THORACIC AORTA - CAROTID ANGIOGRAM  October 2007   Dr. 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 TRANSTHORACIC ECHOCARDIOGRAM  February 2016   [facility]: Normal LV function. Dilated left atrium.    There were no vitals filed for this visit.  Subjective Assessment - 11/16/18 1519    Subjective  Patient reports that he is doing well today. Denies pain today and falls/stumbles since last session. He notes that he was going to the gym up until earlier this week as his gym closed for the virus. He is seeking additional exercises that he can perform at home in the meantime.    Pertinent History  Patient goes 5x/week for radiation therapy for prostate cancer. Is getting weaker and no longer goes to the senior center. Just started using cane, when walks a lot his low back and legs ache. He has a significant CV history including quadruple bypass, CAD w cardiac stents, on Eliquis, left carotid endarterectomy, AAA. He is functionally independent but lives alone, his partner lives 5 minutes down the road and seems to assist him with daily living. Activity as tolerated per patient report. Has history of memory deficits.     Limitations  Walking;Standing;House hold  activities;Other (comment);Lifting    How long can you sit comfortably?  n/a    How long can you stand comfortably?  Reports that he has not used his cane in about 1 month    How long can you walk comfortably?  200-300 yards    Patient Stated Goals  Pt would still like to work on his balance    Currently in Pain?  No/denies       Neuromuscular Re-education: verbal cueing for task orientation and body mechanics for stability.  -Rainbow ball: vertical toss in air 2x 86 ft with visual follow; CGA no LOB -Rainbow ball: ball bounce in hallway 2x 86 ft. CGA no LOB -Horizontal head turns with ball pass in hallway 1x 86 ft to each side, CGA no LOB  -SPT sets up cones in square shape for practice of dynamic balance whilst turning corners/changing  directions; patient performs/ambulates 4 trials down and back (4 turns to R around cone and 4 turns to L around cone each trial); CGA; one minor LOB requiring minimal SPT assistance to correct; patient knocks over cones 4 times during performance; patient indicates exercise is 'medium' difficulty  -Ambulate and stop and perform static stand upon SPT command with CGA 2x30 feet; patient demonstrates one LOB and demonstrates use of step strategy but ultimately requires minimal SPT assistance to correct   -Stoplight game for changing gait speeds: fast vs normal vs slow for movement control for improved stability and decreased episodes of LOB 2x30 feet; no LOB; patient indicates it is easy  -Alternating toe taps onto 6" step from Airex pad 2x10 without BUE support; CGA; no LOB; patient indicates it is a good challenge   TherEx: Education and Performance of the following added to HEP:   -Seated LAQ 1x10 each side   -Standing hamstring curls with BUE support for balance 1x10 each side; patient benefits from verbal and tactile cues to isolate knee flexion and to prevent hip flexion  -Seated hip adduction ball isometrics/squeezes with green ball 1x10 with 3 second  holds; benefits from verbal cues to breathe  -Seated hip abduction against red TB resistance 1x10   Vitals monitored throughout session during rest periods to ensure O2 saturation return to functional range prior to initiation of each exercise.                       PT Education - 11/16/18 1523    Education provided  Yes    Education Details  exercise technique, stability, static/dynamic balance and balance with ambulation, mobility, addition of exercises to HEP    Person(s) Educated  Patient    Methods  Explanation;Demonstration;Tactile cues;Verbal cues    Comprehension  Verbalized understanding;Returned demonstration;Verbal cues required;Tactile cues required;Need further instruction       PT Short Term Goals - 11/02/18 1449      PT SHORT TERM GOAL #1   Title  Patient will be independent in home exercise program to improve strength/mobility for better functional independence with ADLs.    Baseline  9/17: HEP given 11/4: compliant at this time 2/4: compliant    Time  2    Period  Weeks    Status  Achieved      PT SHORT TERM GOAL #2   Title  Patient will deny any falls over past 2 weeks to demonstrate improved safety awareness at home and work.     Baseline  9/17: progressive weakness 11/4: denies falls last 2 weeks    Time  2    Period  Weeks    Status  Achieved      PT SHORT TERM GOAL #3   Title  Patient will increase BLE gross strength to 4-/5 as to improve functional strength for independent gait, increased standing tolerance and increased ADL ability.    Baseline  9/17: Hip flex: L 3/5 R 3+/5 abd: 2+/5, 3-/5; add 2/5 2+/5 11/4: hip flexion L 3+/5 R 3+/5 abd 2+/5, 2+/5 add 2+/5, 2+/5 2/4: 4-/5     Time  2    Period  Weeks    Status  Achieved        PT Long Term Goals - 11/02/18 1450      PT LONG TERM GOAL #1   Title  Patient will increase BLE gross strength to 4+/5 as to improve functional strength for independent gait, increased standing tolerance  and increased ADL ability.    Baseline  9/17: Hip flex: L 3/5 R 3+/5 abd: 2+/5, 3-/5; add 2/5 2+/5 11/4: hip flexion L 3+/5 R 3+/5 abd 2+/5, 2+/5 add 2+/5, 2+/5 2/4: 4-/5; 11/02/2018: Flex 4+/5 bilaterally, abd 5/5 bilaterally (sitting), add 5/5 bilaterally (sitting)    Time  4    Period  Weeks    Status  Achieved      PT LONG TERM GOAL #2   Title  Patient will increase ABC scale score >80% to demonstrate better functional mobility and better confidence with ADLs.     Baseline  9/17: 68.13% 11/4: 76.6% 2/4: 82.5%, 3/3: 83.44%    Time  8    Period  Weeks    Status  Achieved      PT LONG TERM GOAL #3   Title  Patient will increase Berg Balance score by > 6 points (44/56)  to demonstrate decreased fall risk during functional activities.    Baseline  9/17: 38/56 11/4: 39/56 12/12: 43/56, 11/02/2018: 51/56    Time  4    Period  Weeks    Status  Achieved      PT LONG TERM GOAL #4   Title  Patient will increase six minute walk test distance to >1000 for progression to community ambulator and improve gait ability    Baseline  9/18: 1300 ft w/o cane 11/4: 96f  12/12: 840 ft without cane 2/4: 870 ft without cane, 815 ft without cane    Time  4    Period  Weeks    Status  On-going    Target Date  11/30/18      PT LONG TERM GOAL #5   Title  Patient (> 680years old) will complete five times sit to stand test in < 15 seconds without UE support indicating an increased LE strength and improved balance.    Baseline  11/4: requires 1 UE support 12/12:  18 seconds no UE support 2/4: 17 seconds no UE support; 3/3: 16 sec 1 UE, 17 sec no UE    Time  4    Period  Weeks    Status  Partially Met    Target Date  11/30/18      Additional Long Term Goals   Additional Long Term Goals  Yes      PT LONG TERM GOAL #6   Title  Patient will improve score on DGI to > or = 20/24 on the DGI to increase dynamic balance during mobility and to decrease risk of falls.    Baseline  11/02/18: 14/24    Time  4    Period   Weeks    Status  New    Target Date  11/30/18            Plan - 11/16/18 1540    Clinical Impression Statement  The patient continues to progress with activities/exercises regarding static and dynamic balance and balance with ambulation. Overall, the patient did well with the activities involving changing speed and direction while ambulating today, but demonstrated deficits and occasional LOB requiring SPT assistance to correct during performance. The patient was also given additional BLE exercises to incorporate into his HEP while his gym is closed in order to maintain and promote his gains in strength. The patient will continue to benefit from skilled PT in order to work towards goals, to continue to improve BLE strength, to improve both static and dynamic balance, and to maximize independence and safety with  functional mobility.     Rehab Potential  Fair    Clinical Impairments Affecting Rehab Potential  (+) good support system, high prior level of function (-) currently undergoing radiation therapy for prostate cancer, memory deficits     PT Frequency  1x / week    PT Duration  4 weeks    PT Treatment/Interventions  ADLs/Self Care Home Management;Aquatic Therapy;Cryotherapy;Moist Heat;Traction;Ultrasound;Therapeutic activities;Functional mobility training;Stair training;Gait training;DME Instruction;Therapeutic exercise;Balance training;Neuromuscular re-education;Patient/family education;Manual techniques;Passive range of motion;Energy conservation;Vestibular;Taping    PT Next Visit Plan  progress strength and balance; dynamic balance, balance with ambulation    PT Home Exercise Plan  see instructions section for additions to HEP/new exercises    Consulted and Agree with Plan of Care  Patient       Patient will benefit from skilled therapeutic intervention in order to improve the following deficits and impairments:  Abnormal gait, Cardiopulmonary status limiting activity, Decreased  activity tolerance, Decreased balance, Decreased knowledge of precautions, Decreased endurance, Decreased coordination, Decreased cognition, Decreased knowledge of use of DME, Decreased mobility, Difficulty walking, Decreased safety awareness, Decreased strength, Impaired flexibility, Impaired perceived functional ability, Impaired sensation, Postural dysfunction, Improper body mechanics, Pain  Visit Diagnosis: Unsteadiness on feet  Muscle weakness (generalized)  Other abnormalities of gait and mobility     Problem List Patient Active Problem List   Diagnosis Date Noted  . Malignant neoplasm of descending colon ([place])   . Benign neoplasm of descending colon   . Benign neoplasm of transverse colon   . Do not intubate, cardiopulmonary resuscitation (CPR)-only code status 10/08/2018  . Prostate cancer ([place]) 02/25/2018  . Prostatic cyst 01/15/2018  . 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 ([place]) 09/15/2017  . Blood in stool   . Abnormal feces   . Stricture and stenosis of esophagus   . Mild cognitive impairment 08/18/2017  . Senile purpura ([place]) 04/29/2017  . Anemia 04/29/2017  . Frequent falls 04/29/2017  . Benign localized hyperplasia of prostate with urinary obstruction 07/15/2016  . Elevated PSA 07/15/2016  . Incomplete emptying of bladder 07/15/2016  . Urge incontinence 07/15/2016  . Hypothyroidism 04/25/2016  . Macular degeneration 04/25/2016  . Erectile dysfunction due to arterial insufficiency   . Ischemic heart disease with chronotropic incompetence   . CN (constipation) 02/10/2015  . DD (diverticular disease) 02/10/2015  . Fatigue 02/10/2015  . Lichen planus 50/05/3817  . Restless leg 02/10/2015  . 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 B12 deficiency 02/10/2015  . COPD (chronic obstructive pulmonary disease) ([place]) 11/14/2014  . Post Inflammatory Lung Changes 11/14/2014  . PAF (paroxysmal atrial fibrillation) ([facility]) CHA2DS2-VASc = 4. AC = Eliquis   . Insomnia 12/09/2013   . OSA on CPAP   . Dyspnea on exertion - -essentially resolved with BB dose reduction & wgt loss 03/29/2013  . Overweight (BMI 25.0-29.9) -- Wgt back up 01/17/2013  . Atherosclerotic heart disease of native coronary artery without angina pectoris - s/p CABG, occluded SVG-D1; patent LIMA-LAD, SVG-OM, SVG- RCA   . Essential hypertension   . Hyperlipidemia with target LDL less than 70   . Aorto-iliac disease ([place])   . BCC (basal cell carcinoma), eyelid 01/03/2013  . Allergic rhinitis 08/17/2009  . Adaptation reaction 05/28/2009  . Acid reflux 05/28/2009  . Arthritis, degenerative 05/28/2009  . 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 Carotid artery occlusion without infarction 06/01/2006  . PAD (peripheral artery disease) - bilateral common iliac stents 12/15/2005  . Aortoiliac occlusive disease ([place]) 11/30/2005  . Atherosclerotic heart disease of  artery bypass graft 09/01/1997   Orlean Patten, SPT  This entire session was performed under direct supervision and direction of a licensed therapist/therapist assistant . I have personally read, edited and approve of the note as written.  Janna Arch, PT, DPT   11/16/2018, 3:59 PM  [place] MAIN [facility] SERVICES [address] [place], [place], [address] Phone: [phone]   Fax:  [phone]  Name: [name] MRN: [id number] Date of Birth: 04/29/1934

## 2018-11-16 NOTE — Patient Instructions (Signed)
Access Code: [email or web address] URL: [email or web address] Date: 11/16/2018 Prepared by: Janna Arch  Exercises Standing Hamstring Curl with chair support - 10 reps - 2 sets - 5 hold - 1x daily - 7x weekly Seated Long Arc Quad - 10 reps - 2 sets - 5 hold - 1x daily - 7x weekly Seated Hip Adduction Isometrics with ball - 10 reps - 2 sets - 5 hold - 1x daily - 7x weekly Seated hip abduction with resistance - 10 reps - 2 sets - 5 hold - 1x daily - 7x weekly

## 2018-11-22 ENCOUNTER — Ambulatory Visit (INDEPENDENT_AMBULATORY_CARE_PROVIDER_SITE_OTHER): Payer: Medicare Other | Admitting: Family Medicine

## 2018-11-22 ENCOUNTER — Other Ambulatory Visit: Payer: Self-pay

## 2018-11-22 VITALS — BP 116/68 | HR 76 | Temp 97.5°F | Wt 213.0 lb

## 2018-11-22 DIAGNOSIS — J301 Allergic rhinitis due to pollen: Secondary | ICD-10-CM | POA: Diagnosis not present

## 2018-11-22 DIAGNOSIS — K219 Gastro-esophageal reflux disease without esophagitis: Secondary | ICD-10-CM

## 2018-11-22 DIAGNOSIS — L0390 Cellulitis, unspecified: Secondary | ICD-10-CM

## 2018-11-22 DIAGNOSIS — I2589 Other forms of chronic ischemic heart disease: Secondary | ICD-10-CM | POA: Diagnosis not present

## 2018-11-22 MED ORDER — CEPHALEXIN 500 MG PO CAPS: 500.0000 mg | ORAL_CAPSULE | Freq: Four times a day (QID) | ORAL | 0 refills | Status: AC

## 2018-11-22 MED ORDER — PANTOPRAZOLE SODIUM 40 MG PO TBEC: 40.0000 mg | DELAYED_RELEASE_TABLET | Freq: Every day | ORAL | 5 refills | Status: DC

## 2018-11-22 MED ORDER — FLUTICASONE PROPIONATE 50 MCG/ACT NA SUSP: 2.0000 | Freq: Every day | NASAL | 4 refills | Status: DC | PRN

## 2018-11-22 NOTE — Progress Notes (Signed)
Patient: [name] Male    DOB: [birth year]   85 y.o.   MRN: [id number] Visit Date: 11/22/2018  Today's Provider: Lelon Huh, MD   Chief Complaint  Patient presents with  . Wound Check    From a fall two weeks ago.   Subjective:     Wound Check  He was originally treated more than 14 days ago. His temperature was unmeasured prior to arrival. There has been colored discharge from the wound. There is new redness (Redness started a few days ago.) present. There is new swelling (Mildly) present. There is new pain present.   Abscess: Patient presents for evaluation of a cutaneous abscess. Lesion is located in the forehead. Onset was 1 week ago. Symptoms have gradually worsened. Abscess has associated symptoms of none. Patient does not have previous history of cutaneous abscesses. Patient does not have diabetes.   Pt will need a prescription to replace Dexilant.  Pt states it is no longer covered on his insurance.      Allergies  Allergen Reactions  . Clonazepam Other (See Comments)    Altered mental status  . Codeine Other (See Comments)    Altered mental status.  . Niacin And Related Other (See Comments)    Flushing of skin  . Tramadol     Other reaction(s): Hallucination  . Hydrocodone     confusion  . Oxycodone Other (See Comments)    confusion confusion     Current Outpatient Medications:  .  acetaminophen (TYLENOL) 500 MG tablet, Take 500-1,000 mg by mouth every 6 (six) hours as needed (for pain.)., Disp: , Rfl:  .  ADVAIR DISKUS 250-50 MCG/DOSE AEPB, INHALE 1 PUFF TWICE A DAY AS DIRECTED **RINSE MOUTH AFTER USE**, Disp: 60 each, Rfl: 5 .  apixaban (ELIQUIS) 5 MG TABS tablet, Take 1 tablet (5 mg total) by mouth 2 (two) times daily., Disp: 180 tablet, Rfl: 2 .  Ascorbic Acid (VITAMIN C) 1000 MG tablet, Take 1,000 mg by mouth daily., Disp: , Rfl:  .  cetirizine (ZYRTEC) 10 MG tablet, Take 10 mg by mouth daily as needed. , Disp: , Rfl:  .   Cholecalciferol (VITAMIN D-3 PO), Take 2,000 Units by mouth every evening. , Disp: , Rfl:  .  Cyanocobalamin (VITAMIN B-12) 500 MCG SUBL, Place 500 mcg under the tongue daily. , Disp: , Rfl:  .  DEXILANT 60 MG capsule, TAKE 1 CAPSULE EVERY DAY, Disp: 30 capsule, Rfl: 11 .  donepezil (ARICEPT) 10 MG tablet, Take 10 mg by mouth at bedtime. , Disp: , Rfl:  .  ezetimibe (ZETIA) 10 MG tablet, TAKE 1 TABLET BY MOUTH DAILY, Disp: 30 tablet, Rfl: 5 .  finasteride (PROSCAR) 5 MG tablet, Take 5 mg by mouth daily., Disp: , Rfl:  .  fluticasone (FLONASE) 50 MCG/ACT nasal spray, Place 2 sprays into both nostrils daily. (Patient taking differently: Place 2 sprays into both nostrils daily as needed for allergies. ), Disp: 16 g, Rfl: 6 .  lisinopril (PRINIVIL,ZESTRIL) 2.5 MG tablet, TAKE ONE TABLET BY MOUTH TWICE DAILY, Disp: 180 tablet, Rfl: 4 .  MAGNESIUM OXIDE PO, Take 500 mg by mouth every evening. , Disp: , Rfl:  .  metoprolol tartrate (LOPRESSOR) 25 MG tablet, TAKE ONE TABLET BY MOUTH TWICE DAILY, Disp: 180 tablet, Rfl: 1 .  mirabegron ER (MYRBETRIQ) 50 MG TB24 tablet, Take 1 tablet (50 mg total) by mouth daily., Disp: 30 tablet, Rfl: 0 .  Multiple Vitamins-Minerals (PRESERVISION  AREDS 2 PO), Take 1 tablet by mouth 2 (two) times daily. , Disp: , Rfl:  .  nitroGLYCERIN (NITROSTAT) 0.4 MG SL tablet, Place 1 tablet (0.4 mg total) under the tongue every 5 (five) minutes as needed for chest pain., Disp: 20 tablet, Rfl: 1 .  NON FORMULARY, Swish and spit 1 Dose at bedtime. Nystatin elixir month wash , Disp: , Rfl:  .  nortriptyline (PAMELOR) 10 MG capsule, Take 10 mg by mouth at bedtime. , Disp: , Rfl:  .  nystatin (MYCOSTATIN) 100000 UNIT/ML suspension, Use as directed 5 mLs in the mouth or throat daily as needed (irritation). , Disp: , Rfl:  .  rosuvastatin (CRESTOR) 20 MG tablet, TAKE ONE TABLET EVERY DAY, Disp: 90 tablet, Rfl: 4 .  tamsulosin (FLOMAX) 0.4 MG CAPS capsule, Take 0.4 mg by mouth daily., Disp: ,  Rfl:   Review of Systems  Constitutional: Negative.   Cardiovascular: Positive for leg swelling.  Skin: Positive for wound. Negative for color change, pallor and rash.    Social History   Tobacco Use  . Smoking status: Former Smoker    Types: Cigarettes    Last attempt to quit: 08/04/1997    Years since quitting: 21.3  . Smokeless tobacco: Never Used  Substance Use Topics  . Alcohol use: No      Objective:   BP 116/68 (BP Location: Right Arm, Patient Position: Sitting, Cuff Size: Large)   Pulse 76   Temp (!) 97.5 F (36.4 C) (Oral)   Wt 213 lb (96.6 kg)   BMI 28.89 kg/m  Vitals:   11/22/18 1410  BP: 116/68  Pulse: 76  Temp: (!) 97.5 F (36.4 C)  TempSrc: Oral  Weight: 213 lb (96.6 kg)     Physical Exam   General appearance: alert, well developed, well nourished, cooperative and in no distress Head: Normocephalic, without obvious abnormality, atraumatic Respiratory: Respirations even and unlabored, normal respiratory rate Skin: Skin color, texture, turgor normal. No rashes seen  .        Assessment & Plan    1. Cellulitis, unspecified cellulitis site  - cephALEXin (KEFLEX) 500 MG capsule; Take 1 capsule (500 mg total) by mouth 4 (four) times daily for 7 days.  Dispense: 28 capsule; Refill: 0  2. Gastroesophageal reflux disease, esophagitis presence not specified Change PPI due to insurance formulary - pantoprazole (PROTONIX) 40 MG tablet; Take 1 tablet (40 mg total) by mouth daily.  Dispense: 30 tablet; Refill: 5  3. Allergic rhinitis due to pollen, unspecified seasonality Prescription  - fluticasone (FLONASE) 50 MCG/ACT nasal spray; Place 2 sprays into both nostrils daily as needed for allergies.  Dispense: 16 g; Refill: 4  Call if symptoms change or if not rapidly improving.        Lelon Huh, MD  [facility]

## 2018-11-22 NOTE — Therapy (Signed)
[place] MAIN [place] SERVICES [address] [place], [place], [address] Phone: [phone]   Fax:  [phone]  Patient Details  Name: [name] MRN: [id number] Date of Birth: 1934-08-14 Referring Provider:  No ref. provider found  Encounter Date: 11/22/2018  Patient called by PT to ensure patient is doing well, does not have questions, and review HEP. Called due to current outpatient closure for COVID- 19. Patient did not pick up PT call. PT left voicemail stating reason for call and number for call back for further questions/concerns.   Janna Arch, PT, DPT    11/22/2018, 11:33 AM  [place] MAIN [facility] SERVICES [address] [place], [place], [address] Phone: [phone]   Fax:  [phone]

## 2018-11-23 ENCOUNTER — Ambulatory Visit: Payer: Medicare Other

## 2018-11-27 NOTE — Patient Instructions (Signed)
.   Please review the attached list of medications and notify my office if there are any errors.   . Please bring all of your medications to every appointment so we can make sure that our medication list is the same as yours.   

## 2018-11-29 ENCOUNTER — Ambulatory Visit: Payer: Medicare Other

## 2018-12-14 NOTE — Therapy (Signed)
[place] [facility] [address] [place], [place], [address] Phone: [phone]   Fax:  [phone]  Patient Details  Name: [name] MRN: [id number] Date of Birth: [birth year] Referring Provider:  No ref. provider found  Encounter Date: 12/14/2018   The [facility] outpatient clinics are closed at this time due to the COVID-19 epidemic. The patient was contacted in regards to their therapy services. The patient did not answer so voicemail was left. Education provided regarding telehealth options. Requested return call for patient to notify clinic how he was doing as well as his interest in pursuing telehealth therapy.     Phillips Grout PT, DPT, GCS  Osiris Odriscoll 12/14/2018, 3:39 PM  [place] [facility] [address] [place], [place], [address] Phone: [phone]   Fax:  [phone]

## 2018-12-15 ENCOUNTER — Ambulatory Visit (INDEPENDENT_AMBULATORY_CARE_PROVIDER_SITE_OTHER): Payer: Medicare Other | Admitting: Urology

## 2018-12-15 ENCOUNTER — Other Ambulatory Visit: Payer: Self-pay

## 2018-12-15 DIAGNOSIS — C61 Malignant neoplasm of prostate: Secondary | ICD-10-CM

## 2018-12-15 DIAGNOSIS — N3941 Urge incontinence: Secondary | ICD-10-CM | POA: Diagnosis not present

## 2018-12-15 MED ORDER — MIRABEGRON ER 50 MG PO TB24: 50.0000 mg | ORAL_TABLET | Freq: Every day | ORAL | 11 refills | Status: DC

## 2018-12-15 NOTE — Progress Notes (Signed)
Virtual Visit via Telephone Note  I connected with [name] on 12/15/18 at 10:15 AM EDT by telephone and verified that I am speaking with the correct person using two identifiers.   I discussed the limitations, risks, security and privacy concerns of performing an evaluation and management service by telephone and the availability of in person appointments. We discussed the impact of the COVID-19 on the healthcare system, and the importance of social distancing and reducing patient and provider exposure. I also discussed with the patient that there may be a patient responsible charge related to this service. The patient expressed understanding and agreed to proceed.  Reason for visit: Telephone follow-up secondary to COVID-19 pandemic  History of Present Illness: Urologic history: 1.  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  I last saw him in January 2020.  He saw Dr. Baruch Gouty shortly after that visit and his PSA was 0.1.  He has chronic urinary incontinence and remains on Myrbetriq 50 mg, finasteride and tamsulosin.  His daytime symptoms are stable although he does have nocturia several times per night.  We had discussed a condom catheter at the last visit and he elected not to pursue.  Denies dysuria or gross hematuria.  Denies flank, abdominal pain.  Assessment and Plan: 85 year old male with prostate cancer and lower urinary tract symptoms.  He has an appointment with Dr. Baruch Gouty in August 2020.  Myrbetriq was refilled.  Follow Up: Will move to annual follow-up and he will call as needed for any significant change in his symptoms.   I discussed the assessment and treatment plan with the patient.  The patient was provided an opportunity to ask questions and all were answered. The patient agreed with the plan and demonstrated an understanding of the instructions.   The patient was advised to call back or seek an in-person evaluation if the symptoms worsen or if the condition fails to improve as anticipated.  I provided 6 minutes of non-face-to-face time during this encounter.   Abbie Sons, MD

## 2019-01-06 ENCOUNTER — Emergency Department: Payer: Medicare Other

## 2019-01-06 ENCOUNTER — Other Ambulatory Visit: Payer: Self-pay

## 2019-01-06 ENCOUNTER — Emergency Department
Admission: EM | Admit: 2019-01-06 | Discharge: 2019-01-06 | Disposition: A | Discharge status: Home / Self Care | Payer: Medicare Other | Attending: Emergency Medicine | Admitting: Emergency Medicine

## 2019-01-06 DIAGNOSIS — I10 Essential (primary) hypertension: Secondary | ICD-10-CM | POA: Insufficient documentation

## 2019-01-06 DIAGNOSIS — S0990XA Unspecified injury of head, initial encounter: Secondary | ICD-10-CM | POA: Diagnosis not present

## 2019-01-06 DIAGNOSIS — Y998 Other external cause status: Secondary | ICD-10-CM | POA: Insufficient documentation

## 2019-01-06 DIAGNOSIS — Z87891 Personal history of nicotine dependence: Secondary | ICD-10-CM | POA: Diagnosis not present

## 2019-01-06 DIAGNOSIS — W19XXXA Unspecified fall, initial encounter: Secondary | ICD-10-CM | POA: Diagnosis not present

## 2019-01-06 DIAGNOSIS — S52502A Unspecified fracture of the lower end of left radius, initial encounter for closed fracture: Secondary | ICD-10-CM | POA: Diagnosis not present

## 2019-01-06 DIAGNOSIS — M79602 Pain in left arm: Secondary | ICD-10-CM | POA: Diagnosis not present

## 2019-01-06 DIAGNOSIS — Y9259 Other trade areas as the place of occurrence of the external cause: Secondary | ICD-10-CM | POA: Diagnosis not present

## 2019-01-06 DIAGNOSIS — W101XXA Fall (on)(from) sidewalk curb, initial encounter: Secondary | ICD-10-CM | POA: Insufficient documentation

## 2019-01-06 DIAGNOSIS — R52 Pain, unspecified: Secondary | ICD-10-CM | POA: Diagnosis not present

## 2019-01-06 DIAGNOSIS — Z79899 Other long term (current) drug therapy: Secondary | ICD-10-CM | POA: Insufficient documentation

## 2019-01-06 DIAGNOSIS — S6992XA Unspecified injury of left wrist, hand and finger(s), initial encounter: Secondary | ICD-10-CM | POA: Diagnosis present

## 2019-01-06 DIAGNOSIS — Y9301 Activity, walking, marching and hiking: Secondary | ICD-10-CM | POA: Insufficient documentation

## 2019-01-06 DIAGNOSIS — S52572D Other intraarticular fracture of lower end of left radius, subsequent encounter for closed fracture with routine healing: Secondary | ICD-10-CM | POA: Diagnosis not present

## 2019-01-06 DIAGNOSIS — S52572A Other intraarticular fracture of lower end of left radius, initial encounter for closed fracture: Secondary | ICD-10-CM | POA: Diagnosis not present

## 2019-01-06 DIAGNOSIS — R079 Chest pain, unspecified: Secondary | ICD-10-CM | POA: Diagnosis not present

## 2019-01-06 DIAGNOSIS — S199XXA Unspecified injury of neck, initial encounter: Secondary | ICD-10-CM | POA: Diagnosis not present

## 2019-01-06 DIAGNOSIS — S52612D Displaced fracture of left ulna styloid process, subsequent encounter for closed fracture with routine healing: Secondary | ICD-10-CM | POA: Diagnosis not present

## 2019-01-06 LAB — BASIC METABOLIC PANEL
Anion gap: 8 (ref 5–15)
BUN: 22 mg/dL (ref 8–23)
CO2: 24 mmol/L (ref 22–32)
Calcium: 8.7 mg/dL — ABNORMAL LOW (ref 8.9–10.3)
Chloride: 108 mmol/L (ref 98–111)
Creatinine, Ser: 1.11 mg/dL (ref 0.61–1.24)
GFR calc Af Amer: >60 mL/min (ref >60)
GFR calc non Af Amer: >60 mL/min (ref >60)
Glucose, Bld: 119 mg/dL — ABNORMAL HIGH (ref 70–99)
Potassium: 4.1 mmol/L (ref 3.5–5.1)
Sodium: 140 mmol/L (ref 135–145)

## 2019-01-06 LAB — CBC WITH DIFFERENTIAL/PLATELET
Abs Immature Granulocytes: 0.05 10*3/uL (ref 0.00–0.07)
Basophils Absolute: 0.1 10*3/uL (ref 0.0–0.1)
Basophils Relative: 1 %
Eosinophils Absolute: 0.2 10*3/uL (ref 0.0–0.5)
Eosinophils Relative: 3 %
HCT: 36.5 % — ABNORMAL LOW (ref 39.0–52.0)
Hemoglobin: 12 g/dL — ABNORMAL LOW (ref 13.0–17.0)
Immature Granulocytes: 1 %
Lymphocytes Relative: 27 %
Lymphs Abs: 2.2 10*3/uL (ref 0.7–4.0)
MCH: 30.9 pg (ref 26.0–34.0)
MCHC: 32.9 g/dL (ref 30.0–36.0)
MCV: 94.1 fL (ref 80.0–100.0)
Monocytes Absolute: 0.8 10*3/uL (ref 0.1–1.0)
Monocytes Relative: 9 %
Neutro Abs: 4.8 10*3/uL (ref 1.7–7.7)
Neutrophils Relative %: 59 %
Platelets: 135 10*3/uL — ABNORMAL LOW (ref 150–400)
RBC: 3.88 MIL/uL — ABNORMAL LOW (ref 4.22–5.81)
RDW: 14.6 % (ref 11.5–15.5)
WBC: 8 10*3/uL (ref 4.0–10.5)
nRBC: 0 % (ref 0.0–0.2)

## 2019-01-06 LAB — PROTIME-INR
INR: 1.3 — ABNORMAL HIGH (ref 0.8–1.2)
Prothrombin Time: 16 seconds — ABNORMAL HIGH (ref 11.4–15.2)

## 2019-01-06 MED ORDER — FENTANYL CITRATE (PF) 100 MCG/2ML IJ SOLN
100.0000 ug | Freq: Once | INTRAMUSCULAR | Status: AC
  Administered 2019-01-06: 100 ug via INTRAVENOUS
  Filled 2019-01-06: qty 2

## 2019-01-06 MED ORDER — METOPROLOL TARTRATE 25 MG PO TABS
25.0000 mg | ORAL_TABLET | Freq: Once | ORAL | Status: AC
  Administered 2019-01-06: 25 mg via ORAL
  Filled 2019-01-06: qty 1

## 2019-01-06 MED ORDER — OXYCODONE HCL 5 MG PO TABS: 5.0000 mg | ORAL_TABLET | Freq: Three times a day (TID) | ORAL | 0 refills | Status: DC | PRN

## 2019-01-06 MED ORDER — LISINOPRIL 5 MG PO TABS
2.5000 mg | ORAL_TABLET | Freq: Once | ORAL | Status: AC
  Administered 2019-01-06: 2.5 mg via ORAL
  Filled 2019-01-06: qty 1

## 2019-01-06 MED ORDER — ONDANSETRON HCL 4 MG/2ML IJ SOLN
4.0000 mg | Freq: Once | INTRAMUSCULAR | Status: AC
  Administered 2019-01-06: 4 mg via INTRAVENOUS
  Filled 2019-01-06: qty 2

## 2019-01-06 MED ORDER — HYDROMORPHONE HCL 1 MG/ML IJ SOLN
1.0000 mg | Freq: Once | INTRAMUSCULAR | Status: AC
  Administered 2019-01-06: 1 mg via INTRAVENOUS
  Filled 2019-01-06: qty 1

## 2019-01-06 MED ORDER — LIDOCAINE HCL (PF) 1 % IJ SOLN
10.0000 mL | Freq: Once | INTRAMUSCULAR | Status: AC
  Administered 2019-01-06: 10 mL
  Filled 2019-01-06: qty 10

## 2019-01-06 MED ORDER — ACETAMINOPHEN 500 MG PO TABS
1000.0000 mg | ORAL_TABLET | Freq: Once | ORAL | Status: AC
  Administered 2019-01-06: 1000 mg via ORAL
  Filled 2019-01-06: qty 2

## 2019-01-06 MED ORDER — TETANUS-DIPHTH-ACELL PERTUSSIS 5-2.5-18.5 LF-MCG/0.5 IM SUSP: 0.5000 mL | Freq: Once | INTRAMUSCULAR | Status: DC

## 2019-01-06 NOTE — ED Notes (Signed)
Pt desat'd with good waveform to 87% - placed on 2L O2 via n/c - O2 sat improved to 96%

## 2019-01-06 NOTE — ED Provider Notes (Signed)
[facility] Emergency Department Provider Note  ____________________________________________  Time seen: Approximately 4:09 PM  I have reviewed the triage vital signs and the nursing notes.   HISTORY  Chief Complaint Fall and Arm Pain   HPI [name] is a 85 y.o. male with history as listed below who presents for evaluation of mechanical fall.  Patient was coming out of the [employer] store when he miscalculated his step while coming off a curb.  He fell onto his left side.  He is complaining of severe sharp constant pain in his left wrist which she has an obvious deformity.  EMS reports that initially patient told them that he had hit his head on the floor.  At this time patient denies head trauma and has no obvious trauma to his head.  He is on Eliquis.  He denies headache neck pain, back pain, lower extremity pain, chest pain or abdominal pain.  Past Medical History:  Diagnosis Date   AICD (automatic cardioverter/defibrillator) present    Arthritis    Atherosclerotic heart disease of native coronary artery without angina pectoris 1998   - s/p CABG, occluded SVG-D1; patent LIMA-LAD, SVG-OM, SVG- RCA   Basal cell carcinoma of eyelid 01/03/2013   2019 - R side of Nose   Benign neoplasm of ascending colon    Benign neoplasm of descending colon    Cancer ([place])    Skin cancer   Colon polyps    COPD (chronic obstructive pulmonary disease) (HCC)    GERD (gastroesophageal reflux disease)    Hemorrhoid 02/10/2015   History of hiatal hernia    HOH (hard of hearing)    Hypertension    Memory loss    Osteoporosis    PAF (paroxysmal atrial fibrillation) (HCC)    Prostate disorder    Sciatic pain    Chronic   Sleep apnea    cpap   Temporary cerebral vascular dysfunction 02/10/2015   Had negative work-up.  July, 2012.  No medication changes, had forgot them that day, got dehydrated.     Patient Active Problem List   Diagnosis Date Noted   Malignant neoplasm of descending colon ([place])    Benign neoplasm of descending colon    Benign neoplasm of transverse colon    Do not intubate, cardiopulmonary resuscitation (CPR)-only code status 10/08/2018   Prostate cancer ([place]) 02/25/2018   Prostatic cyst 01/15/2018   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 ([place]) 09/15/2017   Blood in stool    Abnormal feces    Stricture and stenosis of esophagus    Mild cognitive impairment 08/18/2017   Senile purpura ([place]) 04/29/2017   Anemia 04/29/2017   Frequent falls 04/29/2017   Benign localized hyperplasia of prostate with urinary obstruction 07/15/2016   Elevated PSA 07/15/2016   Incomplete emptying of bladder 07/15/2016   Urge incontinence 07/15/2016   Hypothyroidism 04/25/2016   Macular degeneration 04/25/2016   Erectile dysfunction due to arterial insufficiency    Ischemic heart disease with chronotropic incompetence    CN (constipation) 02/10/2015   DD (diverticular disease) 02/10/2015   Fatigue 03/12/1974   Lichen planus 88/32/5498   Restless leg 02/10/2015   Circadian rhythm disorder 02/10/2015   B12 deficiency 02/10/2015   COPD (chronic obstructive pulmonary disease) ([place]) 11/14/2014   Post Inflammatory Lung Changes 11/14/2014   PAF (paroxysmal atrial fibrillation) (HCC) CHA2DS2-VASc = 4. AC = Eliquis    Insomnia 12/09/2013   OSA on CPAP    Dyspnea on exertion - -essentially resolved  with BB dose reduction & wgt loss 03/29/2013   Overweight (BMI 25.0-29.9) -- Wgt back up 01/17/2013   Atherosclerotic heart disease of native coronary artery without angina pectoris - s/p CABG, occluded SVG-D1; patent LIMA-LAD, SVG-OM, SVG- RCA    Essential hypertension    Hyperlipidemia with target LDL less than 70    Aorto-iliac disease ([place])    BCC (basal cell carcinoma), eyelid 01/03/2013   Allergic rhinitis 08/17/2009   Adaptation reaction 05/28/2009   Acid reflux 05/28/2009    Arthritis, degenerative 05/28/2009   Abnormality of aortic arch branch 06/01/2006   Carotid artery occlusion without infarction 06/01/2006   PAD (peripheral artery disease) - bilateral common iliac stents 12/15/2005   Aortoiliac occlusive disease ([place]) 11/30/2005   Atherosclerotic heart disease of artery bypass graft 09/01/1997    Past Surgical History:  Procedure Laterality Date   ANKLE SURGERY     Arch aortogram and carotid aortogram  06/02/2006   Dr Oneida Alar did surgery   BASAL CELL CARCINOMA EXCISION  04/2016   Dermatology   CARDIAC CATHETERIZATION  01/30/1998    (Dr. Linard Millers): Native LAD & mRCA CTO. 90% D1. Mod Cx. SVG-D1 CTO. SVG-RCA & SVG-OM along with LIMA-LAD patent;  LV NORMAL Fxn   CAROTID ENDARTERECTOMY Left Oct. 29, 2007   Dr. Oneida Alar:   CATARACT EXTRACTION W/PHACO Right 03/05/2017   Procedure: CATARACT EXTRACTION PHACO AND INTRAOCULAR LENS PLACEMENT ([place]);  Surgeon: Leandrew Koyanagi, MD;  Location: [facility] ORS;  Service: Ophthalmology;  Laterality: Right;  [place] 00:58.5 AP% 10.6 CDE 6.20 Fluid Pack lot # [id number]   CATARACT EXTRACTION W/PHACO Left 04/15/2017   Procedure: CATARACT EXTRACTION PHACO AND INTRAOCULAR LENS PLACEMENT ([place])  Left;  Surgeon: Leandrew Koyanagi, MD;  Location: [facility];  Service: Ophthalmology;  Laterality: Left;   COLONOSCOPY WITH PROPOFOL N/A 09/15/2017   Procedure: COLONOSCOPY WITH PROPOFOL;  Surgeon: Lucilla Lame, MD;  Location: [facility] ENDOSCOPY;  Service: Endoscopy;  Laterality: N/A;   COLONOSCOPY WITH PROPOFOL N/A 10/26/2018   Procedure: COLONOSCOPY WITH PROPOFOL;  Surgeon: Lucilla Lame, MD;  Location[facility] ENDOSCOPY;  Service: Endoscopy;  Laterality: N/A;   CORONARY ARTERY BYPASS GRAFT  1998   LIMA-LAD, SVG-OM, SVG-RPDA, SVG-DIAG   CPET / MET  12/2012   Mild chronotropic incompetence - read 82% of predicted; also reduced effort; peak VO2 15.7 / 75% (did not reach Max effort) -- suggested ischemic response in last 1.5  minutes of exercise. Normal pulmonary function on PFTs but poor response to   DOPPLER ECHOCARDIOGRAPHY  11/20/2010   EF =>55%; LV norm mild aortic scelorosis   ESOPHAGOGASTRODUODENOSCOPY (EGD) WITH PROPOFOL N/A 09/15/2017   Procedure: ESOPHAGOGASTRODUODENOSCOPY (EGD) WITH PROPOFOL;  Surgeon: Lucilla Lame, MD;  Location: [facility] ENDOSCOPY;  Service: Endoscopy;  Laterality: N/A;   FRACTURE SURGERY Right 03/19/2015   wrist   HIATAL HERNIA REPAIR     ILIAC ARTERY STENT  12/15/2005   PTA and direct stenting rgt and lft common iliac arteries   NM GATED MYOVIEW ([place] [facility])  12/2017   Low Risk - no ischemia or infarct.  EF > 65%. no RWMA   NM MYOVIEW ([facility])  11/11/2010; February 2016   a) TM STRESS----NORMAL PERFUSION ,EF 68%; b) EF 50-55%. Normal LV function. No significant ischemia or infarction.   ORIF ELBOW FRACTURE Right 01/01/2017   Procedure: OPEN REDUCTION INTERNAL FIXATION (ORIF) ELBOW/OLECRANON FRACTURE;  Surgeon: Hessie Knows, MD;  Location: [facility] ORS;  Service: Orthopedics;  Laterality: Right;   ORIF WRIST FRACTURE Right 03/19/2015   Procedure:  OPEN REDUCTION INTERNAL FIXATION (ORIF) WRIST FRACTURE;  Surgeon: Hessie Knows, MD;  Location: [facility] ORS;  Service: Orthopedics;  Laterality: Right;   THORACIC AORTA - CAROTID ANGIOGRAM  October 2007   Dr. 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 Dilated left atrium.    Prior to Admission medications   Medication Sig Start Date End Date Taking? Authorizing Provider  acetaminophen (TYLENOL) 500 MG tablet Take 500-1,000 mg by mouth every 6 (six) hours as needed (for pain.).    [provider]  ADVAIR DISKUS 250-50 MCG/DOSE AEPB INHALE 1 PUFF TWICE A DAY AS DIRECTED **RINSE MOUTH AFTER USE** 09/10/18   Laverle Hobby, MD  apixaban (ELIQUIS) 5 MG TABS tablet Take 1 tablet (5  mg total) by mouth 2 (two) times daily. 02/22/18   Leonie Man, MD  Ascorbic Acid (VITAMIN C) 1000 MG tablet Take 1,000 mg by mouth daily.    [provider]  cetirizine (ZYRTEC) 10 MG tablet Take 10 mg by mouth daily as needed.     [provider]  Cholecalciferol (VITAMIN D-3 PO) Take 2,000 Units by mouth every evening.     [provider]  Cyanocobalamin (VITAMIN B-12) 500 MCG SUBL Place 500 mcg under the tongue daily.     [provider]  donepezil (ARICEPT) 10 MG tablet Take 10 mg by mouth at bedtime.  11/18/17   [provider]  ezetimibe (ZETIA) 10 MG tablet TAKE 1 TABLET BY MOUTH DAILY 05/12/18   [name], MD  finasteride (PROSCAR) 5 MG tablet Take 5 mg by mouth daily. 07/15/16   [provider]  fluticasone (FLONASE) 50 MCG/ACT nasal spray Place 2 sprays into both nostrils daily as needed for allergies. 11/22/18   [name], MD  lisinopril (PRINIVIL,ZESTRIL) 2.5 MG tablet TAKE ONE TABLET BY MOUTH TWICE DAILY 10/19/18   [name], MD  MAGNESIUM OXIDE PO Take 500 mg by mouth every evening.     [provider]  metoprolol tartrate (LOPRESSOR) 25 MG tablet TAKE ONE TABLET BY MOUTH TWICE DAILY 11/10/18   Leonie Man, MD  mirabegron ER (MYRBETRIQ) 50 MG TB24 tablet Take 1 tablet (50 mg total) by mouth daily. 12/15/18   Stoioff, Ronda Fairly, MD  Multiple Vitamins-Minerals (PRESERVISION AREDS 2 PO) Take 1 tablet by mouth 2 (two) times daily.     [provider]  nitroGLYCERIN (NITROSTAT) 0.4 MG SL tablet Place 1 tablet (0.4 mg total) under the tongue every 5 (five) minutes as needed for chest pain. 01/12/17   [name], MD  NON FORMULARY Swish and spit 1 Dose at bedtime. Nystatin elixir month wash     [provider]  nortriptyline (PAMELOR) 10 MG capsule Take 10 mg by mouth at bedtime.     [provider]  nystatin (MYCOSTATIN) 100000 UNIT/ML suspension Use as directed 5 mLs in the  mouth or throat daily as needed (irritation).     [provider]  pantoprazole (PROTONIX) 40 MG tablet Take 1 tablet (40 mg total) by mouth daily. 11/22/18   [name], MD  rosuvastatin (CRESTOR) 20 MG tablet TAKE ONE TABLET EVERY DAY 09/10/18   [name], MD  tamsulosin (FLOMAX) 0.4 MG CAPS capsule Take 0.4 mg by mouth daily. 07/15/16   [provider]    Allergies Clonazepam; Codeine; Niacin and related;  Tramadol; Hydrocodone; and Oxycodone  Family History  Problem Relation Age of Onset   Ulcers Mother        Peptic   Dementia Mother    Alcohol abuse Father     Social History Social History   Tobacco Use   Smoking status: Former Smoker    Types: Cigarettes    Last attempt to quit: 08/04/1997    Years since quitting: 21.4   Smokeless tobacco: Never Used  Substance Use Topics   Alcohol use: No   Drug use: No    Review of Systems Constitutional: Negative for fever. Eyes: Negative for visual changes. ENT: Negative for facial injury or neck injury Cardiovascular: Negative for chest injury. Respiratory: Negative for shortness of breath. Negative for chest wall injury. Gastrointestinal: Negative for abdominal pain or injury. Genitourinary: Negative for dysuria. Musculoskeletal: Negative for back injury, + L wrist pain Skin: Negative for laceration/abrasions. Neurological: Negative for head injury.   ____________________________________________   PHYSICAL EXAM:  VITAL SIGNS: ED Triage Vitals  Enc Vitals Group     BP 01/06/19 1430 (!) 143/67     Pulse Rate 01/06/19 1430 (!) 53     Resp 01/06/19 1445 16     Temp 01/06/19 1445 98 F (36.7 C)     Temp Source 01/06/19 1445 Oral     SpO2 01/06/19 1430 93 %     Weight 01/06/19 1419 212 lb (96.2 kg)     Height 01/06/19 1419 6' (1.829 m)     Head Circumference --      Peak Flow --      Pain Score 01/06/19 1419 5     Pain Loc --      Pain Edu? --      Excl. in [place]? --     Full  spinal precautions maintained throughout the trauma exam. Constitutional: Alert and oriented. No acute distress. Does not appear intoxicated. HEENT Head: Normocephalic and atraumatic. Face: No facial bony tenderness. Stable midface Ears: No hemotympanum bilaterally. No Battle sign Eyes: No eye injury. PERRL. No raccoon eyes Nose: Nontender. No epistaxis. No rhinorrhea Mouth/Throat: Mucous membranes are moist. No oropharyngeal blood. No dental injury. Airway patent without stridor. Normal voice. Neck: no C-collar in place. No midline c-spine tenderness.  Cardiovascular: Normal rate, regular rhythm. Normal and symmetric distal pulses are present in all extremities. Pulmonary/Chest: Chest wall is stable and nontender to palpation/compression. Normal respiratory effort. Breath sounds are normal. No crepitus.  Abdominal: Soft, nontender, non distended. Musculoskeletal: Obvious deformity of the left wrist. Nontender with normal full range of motion in all other extremities.  No thoracic or lumbar midline spinal tenderness. Pelvis is stable. Skin: Skin is warm, dry and intact. Skin tear on the L leg Psychiatric: Speech and behavior are appropriate. Neurological: Normal speech and language. Moves all extremities to command. No gross focal neurologic deficits are appreciated.  Glascow Coma Score: 4 - Opens eyes on own 6 - Follows simple motor commands 5 - Alert and oriented GCS: 15   ____________________________________________   LABS (all labs ordered are listed, but only abnormal results are displayed)  Labs Reviewed  BASIC METABOLIC PANEL - Abnormal; Notable for the following components:      Result Value   Glucose, Bld 119 (*)    Calcium 8.7 (*)    All other components within normal limits  CBC WITH DIFFERENTIAL/PLATELET - Abnormal; Notable for the following components:   RBC 3.88 (*)    Hemoglobin 12.0 (*)    HCT  36.5 (*)    Platelets 135 (*)    All other components within normal  limits  PROTIME-INR - Abnormal; Notable for the following components:   Prothrombin Time 16.0 (*)    INR 1.3 (*)    All other components within normal limits   ____________________________________________  EKG  none ____________________________________________  RADIOLOGY  I have personally reviewed the images performed during this visit and I agree with the Radiologist's read.   Interpretation by Radiologist:  Dg Forearm Left  Result Date: 01/06/2019 CLINICAL DATA:  Left forearm pain status post fall EXAM: LEFT FOREARM - 2 VIEW COMPARISON:  None. FINDINGS: There is an old healed fracture of the diaphysis of the ulna. There is a displaced intra-articular comminuted fracture of the distal radius. There is a nondisplaced fracture of the ulnar styloid process. IMPRESSION: 1. Comminuted displaced intra-articular fracture of the distal radius. 2. Nondisplaced fracture of the ulnar styloid process. 3. Old healed fracture of the ulnar diaphysis. Electronically Signed   By: Constance Holster M.D.   On: 01/06/2019 15:23   Dg Wrist Complete Left  Result Date: 01/06/2019 CLINICAL DATA:  Post reduction EXAM: LEFT WRIST - COMPLETE 3+ VIEW COMPARISON:  01/06/2019 FINDINGS: Interval casting of the wrist which limits bone detail. Advanced arthritis at the first [employer] [facility] [employer] [facility] joint. Acute displaced ulnar styloid process fracture. Remote deformity distal shaft of the ulna. Acute comminuted intra-articular distal radius fracture with decreased dorsal displacement. About 1/4 bone with residual dorsal displacement of distal fracture fragment. IMPRESSION: 1. Interim casting of the wrist limits bone detail 2. Acute displaced ulnar styloid process fracture 3. Acute comminuted intra-articular distal radius fracture with decreased displacement. Electronically Signed   By: Donavan Foil M.D.   On: 01/06/2019 17:11   Ct Head Wo Contrast  Result Date: 01/06/2019 CLINICAL DATA:  Trauma. EXAM: CT HEAD WITHOUT CONTRAST CT CERVICAL  SPINE WITHOUT CONTRAST TECHNIQUE: Multidetector CT imaging of the head and cervical spine was performed following the standard protocol without intravenous contrast. Multiplanar CT image reconstructions of the cervical spine were also generated. COMPARISON:  Radiographs of June 09, 2016. CT scan of December 12, 2015. FINDINGS: CT HEAD FINDINGS Brain: Mild diffuse cortical atrophy is noted. Mild chronic ischemic white matter disease is noted. No mass effect or midline shift is noted. Ventricular size is within normal limits. There is no evidence of mass lesion, hemorrhage or acute infarction. Vascular: No hyperdense vessel or unexpected calcification. Skull: Normal. Negative for fracture or focal lesion. Sinuses/Orbits: No acute finding. Other: None. CT CERVICAL SPINE FINDINGS Alignment: Normal. Skull base and vertebrae: No acute fracture. No primary bone lesion or focal pathologic process. Soft tissues and spinal canal: No prevertebral fluid or swelling. No visible canal hematoma. Disc levels: Severe degenerative disc disease is noted at C3-4, C5-6 and C6-7 with anterior posterior osteophyte formation. Mild degenerative disc disease is noted at C4-5. Upper chest: Negative. Other: Degenerative changes are seen involving posterior facet joints bilaterally. IMPRESSION: Mild diffuse cortical atrophy. Mild chronic ischemic white matter disease. No acute intracranial abnormality seen. Severe multilevel degenerative disc disease. No acute abnormality seen in the cervical spine. Electronically Signed   By: Marijo Conception M.D.   On: 01/06/2019 15:20   Ct Cervical Spine Wo Contrast  Result Date: 01/06/2019 CLINICAL DATA:  Trauma. EXAM: CT HEAD WITHOUT CONTRAST CT CERVICAL SPINE WITHOUT CONTRAST TECHNIQUE: Multidetector CT imaging of the head and cervical spine was performed following the standard protocol without intravenous contrast. Multiplanar CT image reconstructions of the cervical  spine were also generated.  COMPARISON:  Radiographs of June 09, 2016. CT scan of December 12, 2015. FINDINGS: CT HEAD FINDINGS Brain: Mild diffuse cortical atrophy is noted. Mild chronic ischemic white matter disease is noted. No mass effect or midline shift is noted. Ventricular size is within normal limits. There is no evidence of mass lesion, hemorrhage or acute infarction. Vascular: No hyperdense vessel or unexpected calcification. Skull: Normal. Negative for fracture or focal lesion. Sinuses/Orbits: No acute finding. Other: None. CT CERVICAL SPINE FINDINGS Alignment: Normal. Skull base and vertebrae: No acute fracture. No primary bone lesion or focal pathologic process. Soft tissues and spinal canal: No prevertebral fluid or swelling. No visible canal hematoma. Disc levels: Severe degenerative disc disease is noted at C3-4, C5-6 and C6-7 with anterior posterior osteophyte formation. Mild degenerative disc disease is noted at C4-5. Upper chest: Negative. Other: Degenerative changes are seen involving posterior facet joints bilaterally. IMPRESSION: Mild diffuse cortical atrophy. Mild chronic ischemic white matter disease. No acute intracranial abnormality seen. Severe multilevel degenerative disc disease. No acute abnormality seen in the cervical spine. Electronically Signed   By: Marijo Conception M.D.   On: 01/06/2019 15:20   Dg Chest Portable 1 View  Result Date: 01/06/2019 CLINICAL DATA:  Chest pain after fall EXAM: PORTABLE CHEST 1 VIEW COMPARISON:  03/31/2018, 12/27/2017 FINDINGS: Post sternotomy changes. Calcified right apically lung nodule. No change. Diffuse coarse interstitial opacity consistent with chronic change. Scarring at the bases. Mild cardiomegaly with aortic atherosclerosis. No pleural effusion or pneumothorax. IMPRESSION: No active disease.  Mild cardiomegaly. Electronically Signed   By: Donavan Foil M.D.   On: 01/06/2019 15:24     ____________________________________________   PROCEDURES  Procedure(s)  performed: yes Reduction of fracture Date/Time: 01/06/2019 6:14 PM Performed by: Rudene Re, MD Authorized by: Rudene Re, MD  Consent: Verbal consent obtained. Risks and benefits: risks, benefits and alternatives were discussed Consent given by: patient Patient understanding: patient states understanding of the procedure being performed Imaging studies: imaging studies available Patient identity confirmed: verbally with patient Time out: Immediately prior to procedure a "time out" was called to verify the correct patient, procedure, equipment, support staff and site/side marked as required. Preparation: Patient was prepped and draped in the usual sterile fashion. Local anesthesia used: yes Anesthesia: hematoma block  Anesthesia: Local anesthesia used: yes Local Anesthetic: lidocaine 1% without epinephrine Anesthetic total: 10 mL  Sedation: Patient sedated: no  Patient tolerance: Patient tolerated the procedure well with no immediate complications Comments: Patient received IV fentanyl and hematoma block with lidocaine 1%.  Hand was hung with finger straps for 30 minutes.  Manipulation then was done and patient was splinted with Ortho-Glass    Critical Care performed:  None ____________________________________________   INITIAL IMPRESSION / ASSESSMENT AND PLAN / ED COURSE  85 y.o. male with history as listed below who presents for evaluation of mechanical fall.  Patient with an obvious deformity of his left wrist.  X-ray consistent with distal radius fracture.  No obvious traumatic injuries to the head and neck.  CT head and neck negative.  Fracture was reduced per procedure note above.  Post reduction x-rays showed improvement of displacement.  Skin tear was clean and dressed.  Tetanus is up-to-date.  Labs showing no signs of dehydration, anemia, electrolyte abnormalities.  Discussed my standard return precautions with patient with close follow-up with orthopedics on  Monday.  I also spoke with patient's wife on the phone and discussed the findings, care at home, and follow-up  with.      As part of my medical decision making, I reviewed the following data within the [place] notes reviewed and incorporated, Labs reviewed , Old chart reviewed, Radiograph reviewed , Notes from prior ED visits and [place] Controlled Substance Database    Pertinent labs & imaging results that were available during my care of the patient were reviewed by me and considered in my medical decision making (see chart for details).    ____________________________________________   FINAL CLINICAL IMPRESSION(S) / ED DIAGNOSES  Final diagnoses:  Fall, initial encounter  Closed fracture of distal end of left radius, unspecified fracture morphology, initial encounter      NEW MEDICATIONS STARTED DURING THIS VISIT:  ED Discharge Orders    None       Note:  This document was prepared using Dragon voice recognition software and may include unintentional dictation errors.    Alfred Levins, [place], MD 01/06/19 [phone]

## 2019-01-06 NOTE — ED Notes (Signed)
[name] updated on pt condition and followup by Dr Alfred Levins  She will be to pick up pt approx 715pm and will be bringing a change of clothes

## 2019-01-06 NOTE — ED Notes (Signed)
Dressing to skin tear on left lower leg applied per MD order

## 2019-01-06 NOTE — ED Notes (Signed)
Per Ms [name] pt has afib and takes Eliquis

## 2019-01-06 NOTE — ED Notes (Signed)
Pt gives permission to talk to his significant other Army Fossa [phone] - she is the person that handles all his medications and affairs - he encourages [place] to contact her with any questions - Ms Kenton Kingfisher reports that the pt has some memory issues and that she will be the one to discuss any care or discharge information with

## 2019-01-06 NOTE — ED Notes (Signed)
fingertraps applied to left hand md.  4 pounds of weights applied.  Pt tolerating well.  Pt placed on 2 liters oxygen due to low sats.  md aware.

## 2019-01-06 NOTE — ED Notes (Signed)
Pt voided in depends and soiled bed linen - pt cleaned, dried, and diaper placed - bed linen changed

## 2019-01-06 NOTE — ED Notes (Signed)
Pt vomited large amount - cleaned pt and clothing - provider was at bedside - order for zofran given

## 2019-01-06 NOTE — ED Triage Notes (Signed)
Pt arrived via EMS - he fell stepping off a curb at the [employer] - reported that pt hit his head on pavement but he does not recall this - he is c/o left wrist and arm pain with obvious deformity - multiple skin tears to left leg and left rib pain - NAD at this time

## 2019-01-06 NOTE — Discharge Instructions (Signed)
Elevate, apply ice. Keep splint dry and in place until cleared by Orthopedics.Follow up with Orthopedics per recommendation on your follow up discharge instructions. Take pain medication as follows:  Pain control: Take tylenol 1000mg  every 8 hours. Take 5mg  of oxycodone every 6 hours for breakthrough pain. If you need the oxycodone make sure to take one senokot as well to prevent constipation.  Do not drink alcohol, drive or participate in any other potentially dangerous activities while taking this medication as it may make you sleepy. Do not take this medication with any other sedating medications, either prescription or over-the-counter.  If your pain becomes severe, if your fingers become blue/purple or pale, or if you experience pins-and-needles sensation in your fingers and hand, remove the elastic wrap and reapply it looser.  Do not change the hard part of the splint.  If that resolves your symptoms it is okay to stay home and follow-up with orthopedics.  If you continue to have those symptoms please return to the emergency room immediately.

## 2019-01-06 NOTE — ED Notes (Signed)
Pain meds given.  md aware   fingertraps in room

## 2019-01-10 ENCOUNTER — Ambulatory Visit (INDEPENDENT_AMBULATORY_CARE_PROVIDER_SITE_OTHER): Payer: Medicare Other | Admitting: Internal Medicine

## 2019-01-10 ENCOUNTER — Encounter: Payer: Self-pay | Admitting: Internal Medicine

## 2019-01-10 ENCOUNTER — Other Ambulatory Visit: Payer: Self-pay

## 2019-01-10 VITALS — BP 134/82 | HR 88 | Ht 72.0 in | Wt 216.8 lb

## 2019-01-10 DIAGNOSIS — J432 Centrilobular emphysema: Secondary | ICD-10-CM | POA: Diagnosis not present

## 2019-01-10 DIAGNOSIS — S52572A Other intraarticular fracture of lower end of left radius, initial encounter for closed fracture: Secondary | ICD-10-CM | POA: Diagnosis not present

## 2019-01-10 DIAGNOSIS — I2589 Other forms of chronic ischemic heart disease: Secondary | ICD-10-CM | POA: Diagnosis not present

## 2019-01-10 DIAGNOSIS — Z01811 Encounter for preprocedural respiratory examination: Secondary | ICD-10-CM | POA: Diagnosis not present

## 2019-01-10 DIAGNOSIS — G4733 Obstructive sleep apnea (adult) (pediatric): Secondary | ICD-10-CM

## 2019-01-10 DIAGNOSIS — Z9989 Dependence on other enabling machines and devices: Secondary | ICD-10-CM

## 2019-01-10 NOTE — Progress Notes (Signed)
* [facility]     Assessment and Plan:  COPD/emphysema with chronic hypoxic respiratory failure, left pleuritic chest pain with splinting and LLL atelectasis.  Preoperative respiratory exam. -Significant emphysematous changes seen on CT chest. -Continue Advair. -Increase activity level. --May be having some LLL atelectasis due to splinting from left rib pain, recommend continued activity, use of CPAP every night.  I did recommend taking 2 Aleve 30 minutes before bedtime to help with some pain issues. - Patient is cleared for surgery from respiratory standpoint.  Obstructive sleep apnea. -Currently on auto CPAP with set pressure of 10-20.  -Now doing much better and is less sleepy during the day. His leaks are slightly elevated and his AHI is 9, however I think we will continue with current treatment rather than trying to look for a perfect fit.   Periodic limb movements in sleep. -Sleep study showed severe periodic limb movements, these persisted on the patient's titration study with an arousal index of 5. -ferritin level, 08/01/16; 46 (normal).  Can recheck ferritin if symptoms continue.  Insomnia. -improved, will continue to monitor.  Restrictive lung disease. -Doubt NSIP, changes are likely related to underlying emphysema. No active treatment required at this time.   Lung nodule. -Right upper lobe granuloma, calcified. No further follow-up needed.  Atrial fibrillation. -Sleep apnea can contribute to atrial fibrillation, therefore treatment of sleep apnea is important part of management.  Weakness/deconditioning. -As above secondary to multiple comorbidities, patient has to try to increase his physical activity level in a safe manner.    Date: 01/10/2019  MRN# [id number] [name] 04-08-1934   [name] is a 85 y.o. old male seen in follow up for chief complaint of  Chief Complaint  Patient presents with  . Follow-up    pt scheduled  for wrist surgery on 01/13/19. pt reports of weakness and low oxygen levels of 80%. pt wearing cpap most nights.      HPI:  The patient is an 85 year old male with history of COPD, sleep apnea.  At last visit his family noted that he was very tired during the day and he was tolerating his CPAP.  He was sent to mask fitting clinic, he was also noted that after he got through the first hour on his download he seemed to wear CPAP the whole night. He was asked to continue Advair. He recently fell and broke his left wrist, they have been checking oxygen at home and his oxygen is often in the low 90's, however occasionally it has been in the 70's, he has been asymptomatic, he is taking non narcotic pain meds. He has left sided bruised ribs.  He feels that his breathing has been doing well. He has not been using the cpap every night and he has been getting confused at night.  He is on advair twice daily.  Surgery is planned for 5/14. He is not currently on home oxygen.   **CPAP download 09/12/2018-10/11/2018>> raw data personally reviewed.  Uses greater than 4 hours is 24/30 days.  Average usage on days used 7 hours 36 minutes.  Pressure range 10-20.  Median pressure 10, 95 percentile 12, max pressure of 13.4.  Leaks are slightly elevated with a median of 31 L.  AHI is 9.  Overall this shows good compliance with adequate control of obstructive sleep apnea with elevated leaks. **CPAP download data 03/03/2018-05/31/2018>> raw data personally reviewed.  Usage greater than 4 hours is 11/90 days.  Average usage on  days used 4 hours 15 minutes.  Pressure set is 10-20.  Median pressure 5, 95th percentile pressure 9, maximum pressure 10.  Residual AHI is 2.6.  This shows poor compliance with CPAP with excellent control of obstructive sleep apnea when used.  There is a discrepancy between the set pressure in the actual pressure range. **Review of download data for 1 month as a 11/26/17.  Uses greater than 4 hours is 20/30  days.  Average usage on days used is 5 hours 44 minutes.  Auto set pressure 10-20.  Median pressure 5, 95th percentile pressure is 9, maximum pressure is 11.  Residual AHI is 3.1.  **Review of his recent sleep titration study showed that his OSA was inadequately treated at the current level, and his PLMI and arousal index remained elevated at 5. At last visit his CPAP was changed to auto 10-20.  **Review of download data 30 days as of 05/24/17; Uses greater than 4 hours is 18 days, average usage on days used was 4 hours 57 minutes, sit pressors 8, residual AHI is 15.9, central apnea index is 1.7.  Review and summary of previous testing: --PFT 11/29/14; FVC=93%; Fev1=103%; ratio=80%. RV=58%, TLC=76%, rv/tlc=71%; Diffusion=37%; appears consistent with restriction.  --The patient has a history of OSA and COPD. Review of sleep study on 05/14/11 showed AHI of 6.9 with RDI of 41. Sleep efficiency was 53% and latency was short at 8 min. PLMI was very elevated at 85 with an arousal index of 9.  --Titration study on 12/12/14; sleep latency of 11 min; with sleep efficiency of 50%, AHI=40. Cpap titrated from 4-14; CPAP of 12 was recommended, however the patient continued to have elevated of 26 at pressure of 14.   Ct images; 12/15/15; 02/21/15--  There is persistent emphysematous changes, greatest in upper lobes. Stable RUL calcified granuloma. 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 Per report it is unchanged, but to my eye it appears that it is advancing. Slight mediastinal lymphadenopathy, paratracheal areas.    Medication:   Outpatient Encounter Medications as of 01/10/2019  Medication Sig  . acetaminophen (TYLENOL) 500 MG tablet Take 500-1,000 mg by mouth every 6 (six) hours as needed (for pain.).  [name] ADVAIR DISKUS 250-50 MCG/DOSE AEPB INHALE 1 PUFF TWICE A DAY AS DIRECTED **RINSE MOUTH AFTER USE**  . apixaban (ELIQUIS) 5 MG TABS tablet Take 1 tablet (5 mg total) by  mouth 2 (two) times daily.  . Ascorbic Acid (VITAMIN C) 1000 MG tablet Take 1,000 mg by mouth daily.  . cetirizine (ZYRTEC) 10 MG tablet Take 10 mg by mouth daily as needed.   . Cholecalciferol (VITAMIN D-3 PO) Take 2,000 Units by mouth every evening.   . Cyanocobalamin (VITAMIN B-12) 500 MCG SUBL Place 500 mcg under the tongue daily.   [name] donepezil (ARICEPT) 10 MG tablet Take 10 mg by mouth at bedtime.   [name] ezetimibe (ZETIA) 10 MG tablet TAKE 1 TABLET BY MOUTH DAILY  . finasteride (PROSCAR) 5 MG tablet Take 5 mg by mouth daily.  . fluticasone (FLONASE) 50 MCG/ACT nasal spray Place 2 sprays into both nostrils daily as needed for allergies.  [name] lisinopril (PRINIVIL,ZESTRIL) 2.5 MG tablet TAKE ONE TABLET BY MOUTH TWICE DAILY  . MAGNESIUM OXIDE PO Take 500 mg by mouth every evening.   . metoprolol tartrate (LOPRESSOR) 25 MG tablet TAKE ONE TABLET BY MOUTH TWICE DAILY  . mirabegron ER (MYRBETRIQ) 50 MG TB24 tablet Take 1 tablet (50 mg total) by mouth  daily.  . Multiple Vitamins-Minerals (PRESERVISION AREDS 2 PO) Take 1 tablet by mouth 2 (two) times daily.   . nitroGLYCERIN (NITROSTAT) 0.4 MG SL tablet Place 1 tablet (0.4 mg total) under the tongue every 5 (five) minutes as needed for chest pain.  . NON FORMULARY Swish and spit 1 Dose at bedtime. Nystatin elixir month wash   . nortriptyline (PAMELOR) 10 MG capsule Take 10 mg by mouth at bedtime.   [name] nystatin (MYCOSTATIN) 100000 UNIT/ML suspension Use as directed 5 mLs in the mouth or throat daily as needed (irritation).   [name] oxyCODONE (ROXICODONE) 5 MG immediate release tablet Take 1 tablet (5 mg total) by mouth every 8 (eight) hours as needed.  . pantoprazole (PROTONIX) 40 MG tablet Take 1 tablet (40 mg total) by mouth daily.  . rosuvastatin (CRESTOR) 20 MG tablet TAKE ONE TABLET EVERY DAY  . tamsulosin (FLOMAX) 0.4 MG CAPS capsule Take 0.4 mg by mouth daily.   No facility-administered encounter medications on file as of 01/10/2019.      Allergies:   Clonazepam; Codeine; Niacin and related; Tramadol; Hydrocodone; and Oxycodone  Review of Systems:  Constitutional: Feels well. Cardiovascular: Denies chest pain, exertional chest pain.  Pulmonary: Denies hemoptysis, pleuritic chest pain.   The remainder of systems were reviewed and were found to be negative other than what is documented in the HPI.    Physical Examination:   VS: BP 134/82 (BP Location: Right Arm, Cuff Size: Normal)   Pulse 88   Ht 6' (1.829 m)   Wt 216 lb 12.8 oz (98.3 kg)   SpO2 94%   BMI 29.40 kg/m   General Appearance: No distress  Neuro:without focal findings, mental status, speech normal, alert and oriented HEENT: PERRLA, EOM intact Pulmonary: No wheezing, No rales, scattered bilat creps.  CardiovascularNormal S1,S2.  No m/r/g.  Abdomen: Benign, Soft, non-tender, No masses Renal:  No costovertebral tenderness  GU:  No performed at this time. Endoc: No evident thyromegaly, no signs of acromegaly or Cushing features Skin:   warm, no rashes, no ecchymosis  Extremities: normal, no cyanosis, clubbing.      LABORATORY PANEL:   CBC Recent Labs  Lab 01/06/19 1430  WBC 8.0  HGB 12.0*  HCT 36.5*  PLT 135*   ------------------------------------------------------------------------------------------------------------------  Chemistries  Recent Labs  Lab 01/06/19 1430  NA 140  K 4.1  CL 108  CO2 24  GLUCOSE 119*  BUN 22  CREATININE 1.11  CALCIUM 8.7*   ------------------------------------------------------------------------------------------------------------------  Cardiac Enzymes No results for input(s): TROPONINI in the last 168 hours. ------------------------------------------------------------  RADIOLOGY:   No results found for this or any previous visit. Results for orders placed during the hospital encounter of 06/02/16  DG Chest 2 View   Narrative CLINICAL DATA:  Fatigue and weakness.  EXAM: CHEST  2 VIEW  COMPARISON:  CT of  the chest on 12/05/2015  FINDINGS: The heart size and mediastinal contours are within normal limits and stable post CABG. Stable calcified granuloma at the right lung apex. Stable bilateral parenchymal scarring. There is no evidence of pulmonary edema, consolidation, pneumothorax, nodule or pleural fluid. The visualized skeletal structures are unremarkable.  IMPRESSION: No active cardiopulmonary disease.   Electronically Signed   By: Aletta Edouard M.D.   On: 06/02/2016 16:19    ------------------------------------------------------------------------------------------------------------------  Thank  you for allowing [facility] to assist in the care of your patient. Our recommendations are noted above.  Please contact us if we can be of  further service.  Marda Stalker, M.D., F.C.C.P.  Board Certified in Internal Medicine, Pulmonary Medicine, [place], and Sleep Medicine.  [facility] Office Number: [phone]  01/10/2019

## 2019-01-10 NOTE — Patient Instructions (Signed)
Take 2 aleve every night 30 minutes before bedtime.  Try to use CPAP every night.  You are cleared for surgery from respiratory standpoint.

## 2019-01-12 ENCOUNTER — Other Ambulatory Visit: Payer: Self-pay

## 2019-01-12 ENCOUNTER — Encounter
Admission: RE | Admit: 2019-01-12 | Discharge: 2019-01-12 | Disposition: A | Discharge status: Home / Self Care | Payer: Medicare Other | Source: Ambulatory Visit | Attending: Orthopedic Surgery | Admitting: Orthopedic Surgery

## 2019-01-12 DIAGNOSIS — E039 Hypothyroidism, unspecified: Secondary | ICD-10-CM | POA: Diagnosis not present

## 2019-01-12 DIAGNOSIS — Z7951 Long term (current) use of inhaled steroids: Secondary | ICD-10-CM | POA: Diagnosis not present

## 2019-01-12 DIAGNOSIS — Z87891 Personal history of nicotine dependence: Secondary | ICD-10-CM | POA: Diagnosis not present

## 2019-01-12 DIAGNOSIS — Z01812 Encounter for preprocedural laboratory examination: Secondary | ICD-10-CM | POA: Insufficient documentation

## 2019-01-12 DIAGNOSIS — G4730 Sleep apnea, unspecified: Secondary | ICD-10-CM | POA: Diagnosis not present

## 2019-01-12 DIAGNOSIS — Z79899 Other long term (current) drug therapy: Secondary | ICD-10-CM | POA: Diagnosis not present

## 2019-01-12 DIAGNOSIS — I10 Essential (primary) hypertension: Secondary | ICD-10-CM | POA: Diagnosis not present

## 2019-01-12 DIAGNOSIS — I480 Paroxysmal atrial fibrillation: Secondary | ICD-10-CM | POA: Diagnosis not present

## 2019-01-12 DIAGNOSIS — Z951 Presence of aortocoronary bypass graft: Secondary | ICD-10-CM | POA: Diagnosis not present

## 2019-01-12 DIAGNOSIS — Z85828 Personal history of other malignant neoplasm of skin: Secondary | ICD-10-CM | POA: Diagnosis not present

## 2019-01-12 DIAGNOSIS — Z95810 Presence of automatic (implantable) cardiac defibrillator: Secondary | ICD-10-CM | POA: Diagnosis not present

## 2019-01-12 DIAGNOSIS — Z7901 Long term (current) use of anticoagulants: Secondary | ICD-10-CM | POA: Diagnosis not present

## 2019-01-12 DIAGNOSIS — F0390 Unspecified dementia without behavioral disturbance: Secondary | ICD-10-CM | POA: Diagnosis not present

## 2019-01-12 DIAGNOSIS — J449 Chronic obstructive pulmonary disease, unspecified: Secondary | ICD-10-CM | POA: Diagnosis not present

## 2019-01-12 DIAGNOSIS — H9190 Unspecified hearing loss, unspecified ear: Secondary | ICD-10-CM | POA: Diagnosis not present

## 2019-01-12 DIAGNOSIS — S52572A Other intraarticular fracture of lower end of left radius, initial encounter for closed fracture: Secondary | ICD-10-CM | POA: Diagnosis not present

## 2019-01-12 DIAGNOSIS — W19XXXA Unspecified fall, initial encounter: Secondary | ICD-10-CM | POA: Diagnosis not present

## 2019-01-12 DIAGNOSIS — K219 Gastro-esophageal reflux disease without esophagitis: Secondary | ICD-10-CM | POA: Diagnosis not present

## 2019-01-12 DIAGNOSIS — I2510 Atherosclerotic heart disease of native coronary artery without angina pectoris: Secondary | ICD-10-CM | POA: Diagnosis not present

## 2019-01-12 DIAGNOSIS — Z1159 Encounter for screening for other viral diseases: Secondary | ICD-10-CM | POA: Insufficient documentation

## 2019-01-12 HISTORY — DX: Unspecified dementia, unspecified severity, without behavioral disturbance, psychotic disturbance, mood disturbance, and anxiety: F03.90

## 2019-01-12 HISTORY — DX: Unspecified urinary incontinence: R32

## 2019-01-12 LAB — SARS CORONAVIRUS 2 BY RT PCR (HOSPITAL ORDER, PERFORMED IN ~~LOC~~ HOSPITAL LAB): SARS Coronavirus 2: NEGATIVE

## 2019-01-12 MED ORDER — CEFAZOLIN SODIUM-DEXTROSE 2-4 GM/100ML-% IV SOLN
2.0000 g | Freq: Once | INTRAVENOUS | Status: AC
  Administered 2019-01-13: 2 g via INTRAVENOUS

## 2019-01-12 NOTE — Patient Instructions (Addendum)
Your procedure is scheduled on: 01/13/2019 Thurs Report to Same Day Surgery 2nd floor medical mall [facility] Entrance-take elevator on left to 2nd floor.  Check in with surgery information desk.) time 8:00 am  Remember: Instructions that are not followed completely may result in serious medical risk, up to and including death, or upon the discretion of your surgeon and anesthesiologist your surgery may need to be rescheduled.    _x___ 1. Do not eat food after midnight the night before your procedure. You may drink clear liquids up to 2 hours before you are scheduled to arrive at the hospital for your procedure.  Do not drink clear liquids within 2 hours of your scheduled arrival to the hospital.  Clear liquids include  --Water or Apple juice without pulp  --Clear carbohydrate beverage such as ClearFast or Gatorade  --Black Coffee or Clear Tea (No milk, no creamers, do not add anything to                  the coffee or Tea Type 1 and type 2 diabetics should only drink water.   ____Ensure clear carbohydrate drink on the way to the hospital for bariatric patients  ____Ensure clear carbohydrate drink 3 hours before surgery for Dr Dwyane Luo patients if physician instructed.   No gum chewing or hard candies.     __x__ 2. No Alcohol for 24 hours before or after surgery.   __x__3. No Smoking or e-cigarettes for 24 prior to surgery.  Do not use any chewable tobacco products for at least 6 hour prior to surgery   ____  4. Bring all medications with you on the day of surgery if instructed.    __x__ 5. Notify your doctor if there is any change in your medical condition     (cold, fever, infections).    x___6. On the morning of surgery brush your teeth with toothpaste and water.  You may rinse your mouth with mouth wash if you wish.  Do not swallow any toothpaste or mouthwash.   Do not wear jewelry, make-up, hairpins, clips or nail polish.  Do not wear lotions, powders, or perfumes. You may  wear deodorant.  Do not shave 48 hours prior to surgery. Men may shave face and neck.  Do not bring valuables to the hospital.    [facility] is not responsible for any belongings or valuables.               Contacts, dentures or bridgework may not be worn into surgery.  Leave your suitcase in the car. After surgery it may be brought to your room.  For patients admitted to the hospital, discharge time is determined by your                       treatment team.  _  Patients discharged the day of surgery will not be allowed to drive home.  You will need someone to drive you home and stay with you the night of your procedure.    Please read over the following fact sheets that you were given:   [facility] Preparing for Surgery and or MRSA Information   _x___ Take anti-hypertensive listed below, cardiac, seizure, asthma,     anti-reflux and psychiatric medicines. These include:  1. ADVAIR DISKUS 250-50   2.finasteride (PROSCAR) 5 MG tablet  3.finasteride (PROSCAR) 5 MG tablet  4.metoprolol tartrate (LOPRESSOR) 25 MG tablet  5.pantoprazole (PROTONIX) 40 MG   6.  ____Fleets  enema or Magnesium Citrate as directed.   _x___ Use CHG Soap or sage wipes as directed on instruction sheet   ____ Use inhalers on the day of surgery and bring to hospital day of surgery  ____ Stop Metformin and Janumet 2 days prior to surgery.    ____ Take 1/2 of usual insulin dose the night before surgery and none on the morning     surgery.   _x___ Follow recommendations from Cardiologist, Pulmonologist or PCP regarding          stopping Aspirin, Coumadin, Plavix ,Eliquis, Effient, or Pradaxa, and Pletal.  X____Stop Anti-inflammatories such as Advil, Aleve, Ibuprofen, Motrin, Naproxen, Naprosyn, Goodies powders or aspirin products. OK to take Tylenol and                          Celebrex.   _x___ Stop supplements until after surgery.  But may continue Vitamin D, Vitamin B,       and multivitamin.   _x___ Bring  C-Pap to the hospital.

## 2019-01-13 ENCOUNTER — Other Ambulatory Visit: Payer: Self-pay

## 2019-01-13 ENCOUNTER — Encounter
Admission: RE | Disposition: A | Discharge status: Home / Self Care | Payer: Self-pay | Source: Home / Self Care | Attending: Orthopedic Surgery

## 2019-01-13 ENCOUNTER — Ambulatory Visit
Admission: RE | Admit: 2019-01-13 | Discharge: 2019-01-13 | Disposition: A | Discharge status: Home / Self Care | Payer: Medicare Other | Attending: Orthopedic Surgery | Admitting: Orthopedic Surgery

## 2019-01-13 ENCOUNTER — Ambulatory Visit: Payer: Medicare Other | Admitting: Anesthesiology

## 2019-01-13 ENCOUNTER — Ambulatory Visit: Payer: Medicare Other

## 2019-01-13 ENCOUNTER — Other Ambulatory Visit: Payer: Self-pay | Admitting: Cardiology

## 2019-01-13 DIAGNOSIS — Z9889 Other specified postprocedural states: Secondary | ICD-10-CM

## 2019-01-13 DIAGNOSIS — J449 Chronic obstructive pulmonary disease, unspecified: Secondary | ICD-10-CM | POA: Insufficient documentation

## 2019-01-13 DIAGNOSIS — F0390 Unspecified dementia without behavioral disturbance: Secondary | ICD-10-CM | POA: Insufficient documentation

## 2019-01-13 DIAGNOSIS — S52572A Other intraarticular fracture of lower end of left radius, initial encounter for closed fracture: Secondary | ICD-10-CM | POA: Diagnosis not present

## 2019-01-13 DIAGNOSIS — W19XXXA Unspecified fall, initial encounter: Secondary | ICD-10-CM | POA: Insufficient documentation

## 2019-01-13 DIAGNOSIS — K219 Gastro-esophageal reflux disease without esophagitis: Secondary | ICD-10-CM | POA: Insufficient documentation

## 2019-01-13 DIAGNOSIS — M25532 Pain in left wrist: Secondary | ICD-10-CM | POA: Diagnosis not present

## 2019-01-13 DIAGNOSIS — G4730 Sleep apnea, unspecified: Secondary | ICD-10-CM | POA: Diagnosis not present

## 2019-01-13 DIAGNOSIS — I480 Paroxysmal atrial fibrillation: Secondary | ICD-10-CM | POA: Insufficient documentation

## 2019-01-13 DIAGNOSIS — S52512A Displaced fracture of left radial styloid process, initial encounter for closed fracture: Secondary | ICD-10-CM | POA: Diagnosis not present

## 2019-01-13 DIAGNOSIS — G8918 Other acute postprocedural pain: Secondary | ICD-10-CM | POA: Diagnosis not present

## 2019-01-13 DIAGNOSIS — Z951 Presence of aortocoronary bypass graft: Secondary | ICD-10-CM | POA: Insufficient documentation

## 2019-01-13 DIAGNOSIS — Z7951 Long term (current) use of inhaled steroids: Secondary | ICD-10-CM | POA: Insufficient documentation

## 2019-01-13 DIAGNOSIS — Z87891 Personal history of nicotine dependence: Secondary | ICD-10-CM | POA: Diagnosis not present

## 2019-01-13 DIAGNOSIS — I2510 Atherosclerotic heart disease of native coronary artery without angina pectoris: Secondary | ICD-10-CM | POA: Insufficient documentation

## 2019-01-13 DIAGNOSIS — I10 Essential (primary) hypertension: Secondary | ICD-10-CM | POA: Insufficient documentation

## 2019-01-13 DIAGNOSIS — Z7901 Long term (current) use of anticoagulants: Secondary | ICD-10-CM | POA: Insufficient documentation

## 2019-01-13 DIAGNOSIS — Z85828 Personal history of other malignant neoplasm of skin: Secondary | ICD-10-CM | POA: Insufficient documentation

## 2019-01-13 DIAGNOSIS — G4733 Obstructive sleep apnea (adult) (pediatric): Secondary | ICD-10-CM | POA: Diagnosis not present

## 2019-01-13 DIAGNOSIS — Z1159 Encounter for screening for other viral diseases: Secondary | ICD-10-CM | POA: Diagnosis not present

## 2019-01-13 DIAGNOSIS — E039 Hypothyroidism, unspecified: Secondary | ICD-10-CM | POA: Diagnosis not present

## 2019-01-13 DIAGNOSIS — Z95810 Presence of automatic (implantable) cardiac defibrillator: Secondary | ICD-10-CM | POA: Insufficient documentation

## 2019-01-13 DIAGNOSIS — Z79899 Other long term (current) drug therapy: Secondary | ICD-10-CM | POA: Insufficient documentation

## 2019-01-13 DIAGNOSIS — Z8781 Personal history of (healed) traumatic fracture: Secondary | ICD-10-CM

## 2019-01-13 DIAGNOSIS — H9190 Unspecified hearing loss, unspecified ear: Secondary | ICD-10-CM | POA: Insufficient documentation

## 2019-01-13 HISTORY — PX: OPEN REDUCTION INTERNAL FIXATION (ORIF) DISTAL RADIAL FRACTURE: SHX5989

## 2019-01-13 SURGERY — OPEN REDUCTION INTERNAL FIXATION (ORIF) DISTAL RADIUS FRACTURE
Anesthesia: Regional | Laterality: Left

## 2019-01-13 MED ORDER — METOCLOPRAMIDE HCL 10 MG PO TABS: 5.0000 mg | ORAL_TABLET | Freq: Three times a day (TID) | ORAL | Status: DC | PRN

## 2019-01-13 MED ORDER — DEXMEDETOMIDINE HCL IN NACL 200 MCG/50ML IV SOLN
INTRAVENOUS | Status: DC | PRN
  Administered 2019-01-13: .7 ug/kg/h via INTRAVENOUS

## 2019-01-13 MED ORDER — ONDANSETRON HCL 4 MG/2ML IJ SOLN: 4.0000 mg | Freq: Four times a day (QID) | INTRAMUSCULAR | Status: DC | PRN

## 2019-01-13 MED ORDER — ONDANSETRON HCL 4 MG/2ML IJ SOLN
INTRAMUSCULAR | Status: DC | PRN
  Administered 2019-01-13: 4 mg via INTRAVENOUS

## 2019-01-13 MED ORDER — FENTANYL CITRATE (PF) 100 MCG/2ML IJ SOLN
INTRAMUSCULAR | Status: AC
  Filled 2019-01-13: qty 2

## 2019-01-13 MED ORDER — METOCLOPRAMIDE HCL 5 MG/ML IJ SOLN: 5.0000 mg | Freq: Three times a day (TID) | INTRAMUSCULAR | Status: DC | PRN

## 2019-01-13 MED ORDER — ONDANSETRON HCL 4 MG PO TABS: 4.0000 mg | ORAL_TABLET | Freq: Four times a day (QID) | ORAL | Status: DC | PRN

## 2019-01-13 MED ORDER — PHENYLEPHRINE HCL (PRESSORS) 10 MG/ML IV SOLN
INTRAVENOUS | Status: DC | PRN
  Administered 2019-01-13 (×2): 100 ug via INTRAVENOUS

## 2019-01-13 MED ORDER — PROPOFOL 500 MG/50ML IV EMUL
INTRAVENOUS | Status: AC
  Filled 2019-01-13: qty 50

## 2019-01-13 MED ORDER — ONDANSETRON HCL 4 MG/2ML IJ SOLN
INTRAMUSCULAR | Status: AC
  Filled 2019-01-13: qty 2

## 2019-01-13 MED ORDER — LIDOCAINE HCL (PF) 1 % IJ SOLN
INTRAMUSCULAR | Status: DC | PRN
  Administered 2019-01-13: 1 mL

## 2019-01-13 MED ORDER — FENTANYL CITRATE (PF) 100 MCG/2ML IJ SOLN: 25.0000 ug | INTRAMUSCULAR | Status: DC | PRN

## 2019-01-13 MED ORDER — GENTAMICIN SULFATE 40 MG/ML IJ SOLN
INTRAMUSCULAR | Status: AC
  Filled 2019-01-13: qty 2

## 2019-01-13 MED ORDER — ROPIVACAINE HCL 5 MG/ML IJ SOLN
INTRAMUSCULAR | Status: DC | PRN
  Administered 2019-01-13: 5 mL via EPIDURAL
  Administered 2019-01-13: 10 mL via EPIDURAL
  Administered 2019-01-13 (×3): 5 mL via EPIDURAL

## 2019-01-13 MED ORDER — LACTATED RINGERS IV SOLN
INTRAVENOUS | Status: DC
  Administered 2019-01-13: 09:00:00 via INTRAVENOUS

## 2019-01-13 MED ORDER — CEFAZOLIN SODIUM-DEXTROSE 2-4 GM/100ML-% IV SOLN
INTRAVENOUS | Status: AC
  Filled 2019-01-13: qty 100

## 2019-01-13 MED ORDER — LIDOCAINE HCL (PF) 1 % IJ SOLN
INTRAMUSCULAR | Status: AC
  Filled 2019-01-13: qty 5

## 2019-01-13 MED ORDER — PROPOFOL 500 MG/50ML IV EMUL
INTRAVENOUS | Status: DC | PRN
  Administered 2019-01-13: 30 ug/kg/min via INTRAVENOUS

## 2019-01-13 MED ORDER — EPHEDRINE SULFATE 50 MG/ML IJ SOLN
INTRAMUSCULAR | Status: DC | PRN
  Administered 2019-01-13 (×2): 5 mg via INTRAVENOUS

## 2019-01-13 MED ORDER — EPHEDRINE SULFATE 50 MG/ML IJ SOLN
INTRAMUSCULAR | Status: AC
  Filled 2019-01-13: qty 1

## 2019-01-13 MED ORDER — ROPIVACAINE HCL 5 MG/ML IJ SOLN
INTRAMUSCULAR | Status: AC
  Filled 2019-01-13: qty 30

## 2019-01-13 MED ORDER — SODIUM CHLORIDE 0.9 % IV SOLN: INTRAVENOUS | Status: DC

## 2019-01-13 SURGICAL SUPPLY — 46 items
APL PRP STRL LF DISP 70% ISPRP (MISCELLANEOUS) ×1
BANDAGE ACE 4X5 VEL STRL LF (GAUZE/BANDAGES/DRESSINGS) ×2 IMPLANT
BIT DRILL 2 FAST STEP (BIT) ×1 IMPLANT
BIT DRILL 2.5X4 QC (BIT) ×1 IMPLANT
CANISTER SUCT 1200ML W/VALVE (MISCELLANEOUS) ×2 IMPLANT
CHLORAPREP W/TINT 26 (MISCELLANEOUS) ×2 IMPLANT
COVER WAND RF STERILE (DRAPES) ×2 IMPLANT
CUFF TOURN SGL QUICK 18X4 (TOURNIQUET CUFF) ×1 IMPLANT
DRAPE FLUOR MINI C-ARM 54X84 (DRAPES) ×2 IMPLANT
ELECT REM PT RETURN 9FT ADLT (ELECTROSURGICAL) ×2
ELECTRODE REM PT RTRN 9FT ADLT (ELECTROSURGICAL) ×1 IMPLANT
GAUZE SPONGE 4X4 12PLY STRL (GAUZE/BANDAGES/DRESSINGS) ×2 IMPLANT
GAUZE XEROFORM 1X8 LF (GAUZE/BANDAGES/DRESSINGS) ×3 IMPLANT
GLOVE SURG SYN 9.0  PF PI (GLOVE) ×1
GLOVE SURG SYN 9.0 PF PI (GLOVE) ×1 IMPLANT
GOWN SRG 2XL LVL 4 RGLN SLV (GOWNS) ×1 IMPLANT
GOWN STRL NON-REIN 2XL LVL4 (GOWNS) ×2
GOWN STRL REUS W/ TWL LRG LVL3 (GOWN DISPOSABLE) ×1 IMPLANT
GOWN STRL REUS W/TWL LRG LVL3 (GOWN DISPOSABLE) ×2
K-WIRE 1.6 (WIRE) ×2
K-WIRE FX5X1.6XNS BN SS (WIRE) ×1
KIT TURNOVER KIT A (KITS) ×2 IMPLANT
KWIRE FX5X1.6XNS BN SS (WIRE) IMPLANT
NDL FILTER BLUNT 18X1 1/2 (NEEDLE) ×1 IMPLANT
NEEDLE FILTER BLUNT 18X 1/2SAF (NEEDLE) ×1
NEEDLE FILTER BLUNT 18X1 1/2 (NEEDLE) ×1 IMPLANT
NS IRRIG 500ML POUR BTL (IV SOLUTION) ×2 IMPLANT
PACK EXTREMITY ARMC (MISCELLANEOUS) ×2 IMPLANT
PAD CAST CTTN 4X4 STRL (SOFTGOODS) ×2 IMPLANT
PADDING CAST COTTON 4X4 STRL (SOFTGOODS) ×2
PEG SUBCHONDRAL SMOOTH 2.0X16 (Peg) ×1 IMPLANT
PEG SUBCHONDRAL SMOOTH 2.0X22 (Peg) ×1 IMPLANT
PEG SUBCHONDRAL SMOOTH 2.0X24 (Peg) ×3 IMPLANT
PEG SUBCHONDRAL SMOOTH 2.0X26 (Peg) ×1 IMPLANT
PLATE SHORT 24.4X51.3 LT (Plate) ×1 IMPLANT
SCALPEL PROTECTED #15 DISP (BLADE) ×4 IMPLANT
SCREW BN 12X3.5XNS CORT TI (Screw) IMPLANT
SCREW CORT 3.5X12 (Screw) ×2 IMPLANT
SCREW CORT 3.5X14 LNG (Screw) ×2 IMPLANT
SCREW MULTI DIRECT 24MM (Screw) ×1 IMPLANT
SPLINT CAST 1 STEP 3X12 (MISCELLANEOUS) ×2 IMPLANT
SUT ETHILON 4-0 (SUTURE) ×2
SUT ETHILON 4-0 FS2 18XMFL BLK (SUTURE) ×1
SUT VICRYL 3-0 27IN (SUTURE) ×2 IMPLANT
SUTURE ETHLN 4-0 FS2 18XMF BLK (SUTURE) ×1 IMPLANT
SYR 3ML LL SCALE MARK (SYRINGE) ×2 IMPLANT

## 2019-01-13 NOTE — Transfer of Care (Signed)
Immediate Anesthesia Transfer of Care Note  Patient: SAAD BUHL  Procedure(s) Performed: OPEN REDUCTION INTERNAL FIXATION (ORIF) DISTAL  RADIAL FRACTURE - LEFT - SLEEP APNEA (Left )  Patient Location: PACU  Anesthesia Type:General  Level of Consciousness: awake, alert  and oriented  Airway & Oxygen Therapy: Patient Spontanous Breathing  Post-op Assessment: Report given to RN and Post -op Vital signs reviewed and stable  Post vital signs: Reviewed and stable  Last Vitals:  Vitals Value Taken Time  BP 130/82 01/13/2019 11:16 AM  Temp 36.6 C 01/13/2019 11:16 AM  Pulse 79 01/13/2019 11:17 AM  Resp 14 01/13/2019 11:17 AM  SpO2 100 % 01/13/2019 11:17 AM  Vitals shown include unvalidated device data.  Last Pain:  Vitals:   01/13/19 1116  TempSrc:   PainSc: 0-No pain         Complications: No apparent anesthesia complications

## 2019-01-13 NOTE — H&P (Signed)
Reviewed paper H+P, will be scanned into chart. No changes noted.  

## 2019-01-13 NOTE — OR Nursing (Signed)
Patient arrived to post OP, left hand with fingers warm and pink with cap refill <3 secs.  Patient and caregiver [name]) instructed to start Eliquis tonight per Dr Rudene Christians.

## 2019-01-13 NOTE — Discharge Instructions (Addendum)
Wrist Fracture Treated With ORIF, Care After This sheet gives you information about how to care for yourself after your procedure. Your health care provider may also give you more specific instructions. If you have problems or questions, contact your health care provider. What can I expect after the procedure? After the procedure, it is common to have:  Pain.  Swelling.  Stiffness.  A small amount of drainage from the incision. Follow these instructions at home: If you have a cast:  Do not stick anything inside the cast to scratch your skin. Doing that increases your risk of infection.  Check the skin around the cast every day. Tell your health care provider about any concerns.  You may put lotion on dry skin around the edges of the cast. Do not put lotion on the skin underneath the cast.  Keep the cast clean and dry. If you have a splint or sling:  Wear the splint or sling as told by your health care provider. Remove it only as told by your health care provider.  Loosen the splint or sling if your fingers tingle, become numb, or turn cold and blue.  Keep the splint or sling clean and dry. Bathing  Do not take baths, swim, or use a hot tub until your health care provider approves. Ask your health care provider if you may take showers. You may only be allowed to take sponge baths.  If your cast, splint, or sling is not waterproof: ? Do not let it get wet. ? Cover it with a watertight covering when you take a bath or shower.  If you have a sling, remove it for bathing only if your health care provider tells you that it is safe to do so.  Keep the bandage (dressing) dry until your health care provider says it can be removed. Incision care   Follow instructions from your health care provider about how to take care of your incision. Make sure you: ? Wash your hands with soap and water before you change your bandage (dressing). If soap and water are not available, use hand  sanitizer. ? Change your dressing as told by your health care provider. ? Leave stitches (sutures), skin glue, or adhesive strips in place. These skin closures may need to stay in place for 2 weeks or longer. If adhesive strip edges start to loosen and curl up, you may trim the loose edges. Do not remove adhesive strips completely unless your health care provider tells you to do that.  Check your incision area every day for signs of infection. Check for: ? Redness. ? More swelling or pain. ? Blood or more fluid. ? Warmth. ? Pus or a bad smell. Managing pain, stiffness, and swelling   If directed, put ice on the injured area. ? If you have a removable splint or sling, remove it as told by your health care provider. ? Put ice in a plastic bag. ? Place a towel between your skin and the bag or between your cast and the bag. ? Leave the ice on for 20 minutes, 2-3 times a day.  Move your fingers often to avoid stiffness and to lessen swelling.  Raise (elevate) the injured area above the level of your heart while you are sitting or lying down. Driving  Do not drive or use heavy machinery while taking prescription pain medicine.  Do not drive for 24 hours if you were given a sedative during your procedure.  Ask your health care provider when  it is safe to drive if you have a cast, splint, or sling on your wrist. Activity  Return to your normal activities as told by your health care provider. Ask your health care provider what activities are safe for you.  Do exercises as told by your health care provider.  Do not lift with your injured wrist until your health care provider approves.  Avoid pulling and pushing. General instructions  Do not put pressure on any part of the cast or splint until it is fully hardened. This may take several hours.  Take over-the-counter and prescription medicines only as told by your health care provider.  Do not use any products that contain nicotine or  tobacco, such as cigarettes and e-cigarettes. These can delay bone healing after surgery. If you need help quitting, ask your health care provider.  If you were prescribed pain medicine, take steps to prevent or treat constipation. Your health care provider may recommend that you: ? Drink enough fluid to keep your urine pale yellow. ? Take over-the-counter or prescription medicines. ? Eat foods that are high in fiber, such as beans, whole grains, and fresh fruits and vegetables. ? Limit foods that are high in fat and processed sugars, such as fried or sweet foods.  Keep all follow-up visits as told by your health care provider. This is important. Contact a health care provider if:  Your cast, splint, or sling is damaged or loose.  Your pain is not controlled with medicine.  You have a fever.  You have redness around your incision.  You have more swelling or pain around your incision.  You have blood or more fluid coming from your incision.  Your incision feels warm to the touch.  You have pus or a bad smell coming from your incision or your dressing.  You develop a rash. Get help right away if:  Your skin or fingers on your injured arm turn blue or gray.  Your arm feels cold or numb.  You have severe pain in your injured wrist.  You have trouble breathing.  You feel faint or light-headed. Summary  After the procedure, it is common to have pain, swelling, stiffness, and a small amount of drainage from the incision.  You may use ice, elevation, and pain medicine as told by your health care provider to lessen pain and swelling.  Wear your splint or sling as told by your health care provider.  Do not lift with your injured wrist until your health care provider approves. This information is not intended to replace advice given to you by your health care provider. Make sure you discuss any questions you have with your health care provider. Document Released: 07/30/2015  Document Revised: 01/05/2018 Document Reviewed: 01/05/2018 Elsevier Interactive Patient Education  2019 [place]   1) The drugs that you were given will stay in your system until tomorrow so for the next 24 hours you should not:  A) Drive an automobile B) Make any legal decisions C) Drink any alcoholic beverage   2) You may resume regular meals tomorrow.  Today it is better to start with liquids and gradually work up to solid foods.  You may eat anything you prefer, but it is better to start with liquids, then soup and crackers, and gradually work up to solid foods.   3) Please notify your doctor immediately if you have any unusual bleeding, trouble breathing, redness and pain at the surgery site, drainage, fever, or  pain not relieved by medication.    4) Additional Instructions:        Please contact your physician with any problems or Same Day Surgery at [phone], Monday through Friday 6 am to 4 pm, or [place] at [facility] number at [phone]                                     .Keep arm elevated is much as possible and work very hard on getting finger motion straightening the fingers and trying to make a fist.  Ice to the back of the wrist today and tomorrow may help with pain.  Try taking 200 mg ibuprofen with 500 mg Tylenol 4 times a day for pain,  taking them at the same time.

## 2019-01-13 NOTE — Anesthesia Procedure Notes (Signed)
Anesthesia Regional Block: Supraclavicular block   Pre-Anesthetic Checklist: ,, timeout performed, Correct Patient, Correct Site, Correct Laterality, Correct Procedure, Correct Position, site marked, Risks and benefits discussed,  Surgical consent,  Pre-op evaluation,  At surgeon's request and post-op pain management  Laterality: Left  Prep: chloraprep       Needles:  Injection technique: Single-shot     Needle Length: 9cm  Needle Gauge: 21     Additional Needles:   Procedures:,,,, ultrasound used (permanent image in chart),,,,  Narrative:  Start time: 01/13/2019 9:07 AM End time: 01/13/2019 9:11 AM  Events: blood aspirated,,,,,,,,,,  Performed by: Personally  Anesthesiologist: Durenda Hurt, MD

## 2019-01-13 NOTE — Anesthesia Post-op Follow-up Note (Signed)
Anesthesia QCDR form completed.        

## 2019-01-13 NOTE — OR Nursing (Addendum)
Verbal consent via phone from daughter Haynes Kerns) [phone].  Dalene Seltzer RN.  Dr Ola Spurr in to see patient and talked with HPOA as well.  Patient noted to have a bandage to left outer knee and one to right lower leg.

## 2019-01-13 NOTE — Op Note (Signed)
01/13/2019  11:12 AM  PATIENT:  [name]  85 y.o. male  PRE-OPERATIVE DIAGNOSIS:  OTHER CLOSED INTRA-ARTICULAR FRACTURE OF DISTAL END OF LEFT RADIUS  POST-OPERATIVE DIAGNOSIS:  OTHER CLOSED INTRA-ARTICULAR FRACTURE OF DISTAL END OF LEFT RADIUS  PROCEDURE:  Procedure(s): OPEN REDUCTION INTERNAL FIXATION (ORIF) DISTAL  RADIAL FRACTURE - LEFT - SLEEP APNEA (Left)  SURGEON: Laurene Footman, MD  ASSISTANTS: None  ANESTHESIA:   regional  EBL:  No intake/output data recorded.  BLOOD ADMINISTERED:none  DRAINS: none   LOCAL MEDICATIONS USED:  NONE  SPECIMEN:  No Specimen  DISPOSITION OF SPECIMEN:  N/A  COUNTS:  YES  TOURNIQUET: 27 minutes at 250 mmHg  IMPLANTS: [employer] short standard plate with multiple pegs and screws left  DICTATION: .Dragon Dictation  patient was brought to the operating room after a regional block is been obtained preoperatively.  The left arm was prepped and draped in the usual sterile fashion with a tourniquet applied the upper arm.  After patient identification and timeout procedures were completed, tourniquet was raised to 250 mmHg.  A volar approach was made centered over the FCR tendon.  There is significant swelling to the forearm after skin incision there was quite a bit of swelling as well in the muscles noted.  The FCR tendon sheath was incised and the tendon retracted radially.  Deep fascia then incised and retractor placed to expose the pronator which was then elevated off the radial side of the proximal and distal fragments.  Fingertrap traction was applied with about 10 pounds of traction to help restore length.  Short standard DVR plate was applied to the appropriate spot and pinned in position with distal pegs placed first.  Next the proximal screws in the shaft were placed.  Drilling, measuring and placing the smooth pegs were performed and care was taken with good visualization with the mini C arm to make sure there are no pegs  protruding into the joint.  After all peg holes were filled the tourniquet was let down and hemostasis checked electrocautery wound was then irrigated and closed with 3-0 Vicryl subcutaneously and 4-0 nylon simple erupted for the skin.  Xeroform 4 x 4 web roll volar splint and Ace wrap then applied  PLAN OF CARE: Discharge to home after PACU  PATIENT DISPOSITION:  PACU - hemodynamically stable.

## 2019-01-13 NOTE — Anesthesia Preprocedure Evaluation (Addendum)
Anesthesia Evaluation  Patient identified by MRN, date of birth, ID band Patient awake    Reviewed: Allergy & Precautions, H&P , NPO status , Patient's Chart, lab work & pertinent test results  Airway Mallampati: II  TM Distance: >3 FB Neck ROM: limited    Dental  (+) Upper Dentures, Lower Dentures   Pulmonary sleep apnea , COPD, former smoker,           Cardiovascular hypertension, + CAD, + CABG and + Peripheral Vascular Disease  + dysrhythmias Atrial Fibrillation Cardiac Defibrillator: AICD in chart but pt denies having.  No ICD palpable on chest.      Neuro/Psych PSYCHIATRIC DISORDERS Dementia negative neurological ROS     GI/Hepatic Neg liver ROS, hiatal hernia, GERD  ,  Endo/Other  Hypothyroidism   Renal/GU negative Renal ROS  negative genitourinary   Musculoskeletal   Abdominal   Peds  Hematology  (+) Blood dyscrasia, anemia ,   Anesthesia Other Findings Prostate cancer  Past Medical History: No date: AICD (automatic cardioverter/defibrillator) present No date: Arthritis 1998: Atherosclerotic heart disease of native coronary artery without  angina pectoris     Comment:  - s/p CABG, occluded SVG-D1; patent LIMA-LAD, SVG-OM,               SVG- RCA 01/03/2013: Basal cell carcinoma of eyelid     Comment:  2019 - R side of Nose No date: Benign neoplasm of ascending colon No date: Benign neoplasm of descending colon No date: Cancer ([place])     Comment:  Skin cancer No date: Colon polyps No date: COPD (chronic obstructive pulmonary disease) (HCC) No date: Coronary artery disease No date: Dementia ([place]) No date: Dysrhythmia     Comment:  atrial fib No date: GERD (gastroesophageal reflux disease) 02/10/2015: Hemorrhoid No date: History of hiatal hernia No date: HOH (hard of hearing) No date: Hypertension No date: Memory loss No date: Osteoporosis No date: PAF (paroxysmal atrial fibrillation) (HCC) No  date: Prostate disorder No date: Sciatic pain     Comment:  Chronic No date: Sleep apnea     Comment:  cpap 02/10/2015: Temporary cerebral vascular dysfunction     Comment:  Had negative work-up.  July, 2012.  No medication               changes, had forgot them that day, got dehydrated.  No date: Urinary incontinence  Past Surgical History: No date: ANKLE SURGERY 06/02/2006: Arch aortogram and carotid aortogram     Comment:  Dr Oneida Alar did surgery 04/2016: BASAL CELL CARCINOMA EXCISION     Comment:  Dermatology 01/30/1998: CARDIAC CATHETERIZATION     Comment:   (Dr. Linard Millers): Native LAD & mRCA CTO. 90% D1. Mod Cx.               SVG-D1 CTO. SVG-RCA & SVG-OM along with LIMA-LAD patent;               LV NORMAL Fxn Oct. 29, 2007: CAROTID ENDARTERECTOMY; Left     Comment:  Dr. Oneida Alar: 03/05/2017: CATARACT EXTRACTION W/PHACO; Right     Comment:  Procedure: CATARACT EXTRACTION PHACO AND INTRAOCULAR               LENS PLACEMENT ([place]);  Surgeon: Leandrew Koyanagi, MD;              Location: [facility] ORS;  Service: Ophthalmology;  Laterality:              Right;  [place] 00:58.5 AP%  10.6 CDE 6.20 Fluid Pack lot #               [id number] 04/15/2017: CATARACT EXTRACTION W/PHACO; Left     Comment:  Procedure: CATARACT EXTRACTION PHACO AND INTRAOCULAR               LENS PLACEMENT ([place])  Left;  Surgeon: Leandrew Koyanagi, MD;  Location: [place];  Service:               Ophthalmology;  Laterality: Left; 09/15/2017: COLONOSCOPY WITH PROPOFOL; N/A     Comment:  Procedure: COLONOSCOPY WITH PROPOFOL;  Surgeon: Lucilla Lame, MD;  Location: [facility] END[facility];  Service:               Endoscopy;  Laterality: N/A; 10/26/2018: COLONOSCOPY WITH PROPOFOL; N/A     Comment:  Procedure: COLONOSCOPY WITH PROPOFOL;  Surgeon: Lucilla Lame, MD;  Location: [facility] ENDOSCOPY;  Service:               Endoscopy;  Laterality: N/A; 1998: CORONARY ARTERY BYPASS GRAFT      Comment:  LIMA-LAD, SVG-OM, SVG-RPDA, SVG-DIAG 12/2012: CPET / MET     Comment:  Mild chronotropic incompetence - read 82% of predicted;               also reduced effort; peak VO2 15.7 / 75% (did not reach               Max effort) -- suggested ischemic response in last 1.5               minutes of exercise. Normal pulmonary function on PFTs               but poor response to 11/20/2010: DOPPLER ECHOCARDIOGRAPHY     Comment:  EF =>55%; LV norm mild aortic scelorosis 09/15/2017: ESOPHAGOGASTRODUODENOSCOPY (EGD) WITH PROPOFOL; N/A     Comment:  Procedure: ESOPHAGOGASTRODUODENOSCOPY (EGD) WITH               PROPOFOL;  Surgeon: Lucilla Lame, MD;  Location: [facility]               ENDOSCOPY;  Service: Endoscopy;  Laterality: N/A; 03/19/2015: FRACTURE SURGERY; Right     Comment:  wrist No date: HIATAL HERNIA REPAIR 12/15/2005: ILIAC ARTERY STENT     Comment:  PTA and direct stenting rgt and lft common iliac               arteries 12/2017: NM GATED MYOVIEW ([place])     Comment:  Low Risk - no ischemia or infarct.  EF > 65%. no RWMA 11/11/2010; February 2016: NM MYOVIEW [facility])     Comment:  a) TM STRESS----NORMAL PERFUSION ,EF 68%; b) EF 50-55%.               Normal LV function. No significant ischemia or               infarction. 01/01/2017: ORIF ELBOW FRACTURE; Right     Comment:  Procedure: OPEN REDUCTION INTERNAL FIXATION (ORIF)               ELBOW/OLECRANON FRACTURE;  Surgeon: Hessie Knows, MD;  Location: [facility] ORS;  Service: Orthopedics;  Laterality:               Right; 03/19/2015: ORIF WRIST FRACTURE; Right     Comment:  Procedure: OPEN REDUCTION INTERNAL FIXATION (ORIF) WRIST              FRACTURE;  Surgeon: Hessie Knows, MD;  Location: [facility]               ORS;  Service: Orthopedics;  Laterality: Right; October 2007: THORACIC AORTA - CAROTID ANGIOGRAM     Comment:  Dr. 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 Dilated left atrium.     Reproductive/Obstetrics negative OB ROS                            Anesthesia Physical Anesthesia Plan  ASA: III  Anesthesia Plan: General and Regional   Post-op Pain Management:    Induction:   PONV Risk Score and Plan: Propofol infusion and TIVA  Airway Management Planned: Simple Face Mask  Additional Equipment:   Intra-op Plan:   Post-operative Plan:   Informed Consent: I have reviewed the patients History and Physical, chart, labs and discussed the procedure including the risks, benefits and alternatives for the proposed anesthesia with the patient or authorized representative who has indicated his/her understanding and acceptance.     Dental Advisory Given  Plan Discussed with: Anesthesiologist and CRNA  Anesthesia Plan Comments:        Anesthesia Quick Evaluation

## 2019-01-14 NOTE — Anesthesia Postprocedure Evaluation (Signed)
Anesthesia Post Note  Patient: [name]  Procedure(s) Performed: OPEN REDUCTION INTERNAL FIXATION (ORIF) DISTAL  RADIAL FRACTURE - LEFT - SLEEP APNEA (Left )  Patient location during evaluation: PACU Anesthesia Type: Regional and General Level of consciousness: awake and alert Pain management: pain level controlled Vital Signs Assessment: post-procedure vital signs reviewed and stable Respiratory status: spontaneous breathing, nonlabored ventilation and respiratory function stable Cardiovascular status: blood pressure returned to baseline and stable Postop Assessment: no apparent nausea or vomiting Anesthetic complications: no     Last Vitals:  Vitals:   01/13/19 1218 01/13/19 1250  BP: (!) 155/73 (!) 149/81  Pulse: 62 62  Resp: 16 16  Temp: (!) 36.3 C 36.6 C  SpO2: 97% 97%    Last Pain:  Vitals:   01/13/19 1250  TempSrc: Temporal  PainSc: 0-No pain                 Durenda Hurt

## 2019-01-25 DIAGNOSIS — S52572D Other intraarticular fracture of lower end of left radius, subsequent encounter for closed fracture with routine healing: Secondary | ICD-10-CM | POA: Diagnosis not present

## 2019-01-31 ENCOUNTER — Other Ambulatory Visit: Payer: Self-pay | Admitting: *Deleted

## 2019-01-31 MED ORDER — FINASTERIDE 5 MG PO TABS: 5.0000 mg | ORAL_TABLET | Freq: Every day | ORAL | 3 refills | Status: DC

## 2019-02-01 DIAGNOSIS — M545 Low back pain: Secondary | ICD-10-CM | POA: Diagnosis not present

## 2019-02-02 DIAGNOSIS — M5137 Other intervertebral disc degeneration, lumbosacral region: Secondary | ICD-10-CM | POA: Diagnosis not present

## 2019-02-02 DIAGNOSIS — M4807 Spinal stenosis, lumbosacral region: Secondary | ICD-10-CM | POA: Diagnosis not present

## 2019-02-11 ENCOUNTER — Other Ambulatory Visit: Payer: Self-pay | Admitting: Family Medicine

## 2019-02-11 MED ORDER — TAMSULOSIN HCL 0.4 MG PO CAPS: 0.4000 mg | ORAL_CAPSULE | Freq: Every day | ORAL | 3 refills | Status: DC

## 2019-02-12 ENCOUNTER — Other Ambulatory Visit: Payer: Self-pay | Admitting: Internal Medicine

## 2019-02-16 DIAGNOSIS — S52572D Other intraarticular fracture of lower end of left radius, subsequent encounter for closed fracture with routine healing: Secondary | ICD-10-CM | POA: Diagnosis not present

## 2019-02-17 ENCOUNTER — Telehealth: Payer: Self-pay

## 2019-02-17 NOTE — Telephone Encounter (Signed)
Received fax from [facility] [employer] that prior authorization is NOT required.

## 2019-02-17 NOTE — Telephone Encounter (Signed)
PA started through covermymeds for Advair 250/50.  Ref# [id number].

## 2019-02-22 DIAGNOSIS — B370 Candidal stomatitis: Secondary | ICD-10-CM | POA: Diagnosis not present

## 2019-02-22 DIAGNOSIS — B379 Candidiasis, unspecified: Secondary | ICD-10-CM | POA: Insufficient documentation

## 2019-02-23 DIAGNOSIS — M79674 Pain in right toe(s): Secondary | ICD-10-CM | POA: Diagnosis not present

## 2019-02-23 DIAGNOSIS — M25551 Pain in right hip: Secondary | ICD-10-CM | POA: Diagnosis not present

## 2019-02-23 DIAGNOSIS — B351 Tinea unguium: Secondary | ICD-10-CM | POA: Diagnosis not present

## 2019-02-23 DIAGNOSIS — L97521 Non-pressure chronic ulcer of other part of left foot limited to breakdown of skin: Secondary | ICD-10-CM | POA: Diagnosis not present

## 2019-02-23 DIAGNOSIS — M545 Low back pain: Secondary | ICD-10-CM | POA: Diagnosis not present

## 2019-02-23 DIAGNOSIS — M79675 Pain in left toe(s): Secondary | ICD-10-CM | POA: Diagnosis not present

## 2019-03-02 ENCOUNTER — Telehealth: Payer: Self-pay | Admitting: Family Medicine

## 2019-03-02 NOTE — Chronic Care Management (AMB) (Signed)
Chronic Care Management   Note  03/02/2019 Name: [name] MRN: [id number] DOB: 06-19-1934  [name] is a 85 y.o. year old male who is a primary care patient of Caryn Section, Kirstie Peri, MD. I reached out to [name] by phone today in response to a referral sent by Mr. Lynda Capistran [facility] health plan.    Mr. Fries was given information about Chronic Care Management services today including:  1. CCM service includes personalized support from designated clinical staff supervised by his physician, including individualized plan of care and coordination with other care providers 2. 24/7 contact phone numbers for assistance for urgent and routine care needs. 3. Service will only be billed when office clinical staff spend 20 minutes or more in a month to coordinate care. 4. Only one practitioner may furnish and bill the service in a calendar month. 5. The patient may stop CCM services at any time (effective at the end of the month) by phone call to the office staff. 6. The patient will be responsible for cost sharing (co-pay) of up to 20% of the service fee (after annual deductible is met).  Patient did not agree to enrollment in care management services and does not wish to consider at this time.  Follow up plan: The patient has been provided with contact information for the chronic care management team and has been advised to call with any health related questions or concerns.   [place]  ??[email or web address]   ??[phone]

## 2019-03-22 ENCOUNTER — Other Ambulatory Visit: Payer: Self-pay | Admitting: Physician Assistant

## 2019-03-22 DIAGNOSIS — M545 Low back pain, unspecified: Secondary | ICD-10-CM

## 2019-03-28 DIAGNOSIS — M545 Low back pain: Secondary | ICD-10-CM | POA: Diagnosis not present

## 2019-03-28 DIAGNOSIS — I714 Abdominal aortic aneurysm, without rupture: Secondary | ICD-10-CM | POA: Diagnosis not present

## 2019-03-28 DIAGNOSIS — M4856XA Collapsed vertebra, not elsewhere classified, lumbar region, initial encounter for fracture: Secondary | ICD-10-CM | POA: Diagnosis not present

## 2019-03-28 DIAGNOSIS — M47816 Spondylosis without myelopathy or radiculopathy, lumbar region: Secondary | ICD-10-CM | POA: Diagnosis not present

## 2019-03-28 DIAGNOSIS — M48061 Spinal stenosis, lumbar region without neurogenic claudication: Secondary | ICD-10-CM | POA: Diagnosis not present

## 2019-03-30 DIAGNOSIS — S32040A Wedge compression fracture of fourth lumbar vertebra, initial encounter for closed fracture: Secondary | ICD-10-CM | POA: Diagnosis not present

## 2019-03-30 DIAGNOSIS — S32050A Wedge compression fracture of fifth lumbar vertebra, initial encounter for closed fracture: Secondary | ICD-10-CM | POA: Diagnosis not present

## 2019-03-30 DIAGNOSIS — S32030A Wedge compression fracture of third lumbar vertebra, initial encounter for closed fracture: Secondary | ICD-10-CM | POA: Diagnosis not present

## 2019-03-30 DIAGNOSIS — S22000A Wedge compression fracture of unspecified thoracic vertebra, initial encounter for closed fracture: Secondary | ICD-10-CM | POA: Diagnosis not present

## 2019-03-31 ENCOUNTER — Inpatient Hospital Stay: Payer: Medicare Other | Attending: Radiation Oncology

## 2019-03-31 ENCOUNTER — Other Ambulatory Visit: Payer: Self-pay

## 2019-03-31 DIAGNOSIS — C61 Malignant neoplasm of prostate: Secondary | ICD-10-CM | POA: Diagnosis not present

## 2019-03-31 LAB — PSA: Prostatic Specific Antigen: 0.16 ng/mL (ref 0.00–4.00)

## 2019-04-01 ENCOUNTER — Other Ambulatory Visit: Admission: RE | Admit: 2019-04-01 | Payer: Medicare Other | Source: Ambulatory Visit

## 2019-04-01 ENCOUNTER — Encounter
Admission: RE | Admit: 2019-04-01 | Discharge: 2019-04-01 | Disposition: A | Discharge status: Home / Self Care | Payer: Medicare Other | Source: Ambulatory Visit | Attending: Orthopedic Surgery | Admitting: Orthopedic Surgery

## 2019-04-01 DIAGNOSIS — Z01812 Encounter for preprocedural laboratory examination: Secondary | ICD-10-CM | POA: Insufficient documentation

## 2019-04-01 DIAGNOSIS — Z20828 Contact with and (suspected) exposure to other viral communicable diseases: Secondary | ICD-10-CM | POA: Diagnosis not present

## 2019-04-01 HISTORY — DX: Malignant neoplasm of prostate: C61

## 2019-04-01 NOTE — Patient Instructions (Signed)
Your procedure is scheduled on: Tuesday 04/05/19 Report to [place]. To find out your arrival time please call [phone] between 1PM - 3PM on Monday 04/04/19.  Remember: Instructions that are not followed completely may result in serious medical risk, up to and including death, or upon the discretion of your surgeon and anesthesiologist your surgery may need to be rescheduled.     _X__ 1. Do not eat food after midnight the night before your procedure.                 No gum chewing or hard candies. You may drink clear liquids up to 2 hours                 before you are scheduled to arrive for your surgery- DO not drink clear                 liquids within 2 hours of the start of your surgery.                 Clear Liquids include:  water, apple juice without pulp, clear carbohydrate                 drink such as Clearfast or Gatorade, Black Coffee or Tea (Do not add                 anything to coffee or tea). Diabetics water only  __X__2.  On the morning of surgery brush your teeth with toothpaste and water, you                 may rinse your mouth with mouthwash if you wish.  Do not swallow any              toothpaste of mouthwash.     _X__ 3.  No Alcohol for 24 hours before or after surgery.   _X__ 4.  Do Not Smoke or use e-cigarettes For 24 Hours Prior to Your Surgery.                 Do not use any chewable tobacco products for at least 6 hours prior to                 surgery.  ____  5.  Bring all medications with you on the day of surgery if instructed.   __X__  6.  Notify your doctor if there is any change in your medical condition      (cold, fever, infections).     Do not wear jewelry, make-up, hairpins, clips or nail polish. Do not wear lotions, powders, or perfumes.  Do not shave 48 hours prior to surgery. Men may shave face and neck. Do not bring valuables to the hospital.    [facility] is not responsible for any  belongings or valuables.  Contacts, dentures/partials or body piercings may not be worn into surgery. Bring a case for your contacts, glasses or hearing aids, a denture cup will be supplied. Leave your suitcase in the car. After surgery it may be brought to your room. For patients admitted to the hospital, discharge time is determined by your treatment team.   Patients discharged the day of surgery will not be allowed to drive home.   Please read over the following fact sheets that you were given:     __X__ Take these medicines the morning of surgery with A SIP OF WATER:  1. finasteride (PROSCAR  2. metoprolol tartrate (LOPRESSOR  3. pantoprazole (PROTONIX  4.  5.  6.  ____ Fleet Enema (as directed)   __X__ Use CHG Soap/SAGE wipes as directed  __X__ Use inhalers on the day of surgery  ____ Stop metformin/Janumet/Farxiga 2 days prior to surgery    ____ Take 1/2 of usual insulin dose the night before surgery. No insulin the morning          of surgery.   __X__ Stop Blood Thinners Coumadin/Plavix/Xarelto/Pleta/Pradaxa/Eliquis/Effient/Aspirin  on   Or contact your Surgeon, Cardiologist or Medical Doctor regarding  ability to stop your blood thinners  __X__ Stop Anti-inflammatories 7 days before surgery such as Advil, Ibuprofen, Motrin,  BC or Goodies Powder, Naprosyn, Naproxen, Aleve, Aspirin    __X__ Stop all herbal supplements, fish oil or vitamin E until after surgery.    __X__ Bring C-Pap to the hospital.

## 2019-04-02 DIAGNOSIS — S32040A Wedge compression fracture of fourth lumbar vertebra, initial encounter for closed fracture: Secondary | ICD-10-CM

## 2019-04-02 HISTORY — DX: Wedge compression fracture of fourth lumbar vertebra, initial encounter for closed fracture: S32.040A

## 2019-04-02 HISTORY — PX: TRANSTHORACIC ECHOCARDIOGRAM: SHX275

## 2019-04-02 LAB — SARS CORONAVIRUS 2 (TAT 6-24 HRS): SARS Coronavirus 2: NEGATIVE

## 2019-04-04 ENCOUNTER — Emergency Department
Admission: EM | Admit: 2019-04-04 | Discharge: 2019-04-04 | Discharge status: Against Medical Advice | Payer: Medicare Other | Attending: Emergency Medicine | Admitting: Emergency Medicine

## 2019-04-04 ENCOUNTER — Encounter: Payer: Self-pay | Admitting: *Deleted

## 2019-04-04 ENCOUNTER — Other Ambulatory Visit: Payer: Self-pay

## 2019-04-04 ENCOUNTER — Other Ambulatory Visit: Payer: Medicare Other

## 2019-04-04 DIAGNOSIS — Z5321 Procedure and treatment not carried out due to patient leaving prior to being seen by health care provider: Secondary | ICD-10-CM | POA: Diagnosis not present

## 2019-04-04 DIAGNOSIS — K5900 Constipation, unspecified: Secondary | ICD-10-CM | POA: Insufficient documentation

## 2019-04-04 DIAGNOSIS — R1032 Left lower quadrant pain: Secondary | ICD-10-CM | POA: Diagnosis present

## 2019-04-04 LAB — COMPREHENSIVE METABOLIC PANEL
ALT: 25 U/L (ref 0–44)
AST: 17 U/L (ref 15–41)
Albumin: 3.5 g/dL (ref 3.5–5.0)
Alkaline Phosphatase: 145 U/L — ABNORMAL HIGH (ref 38–126)
Anion gap: 9 (ref 5–15)
BUN: 22 mg/dL (ref 8–23)
CO2: 26 mmol/L (ref 22–32)
Calcium: 8.9 mg/dL (ref 8.9–10.3)
Chloride: 108 mmol/L (ref 98–111)
Creatinine, Ser: 1 mg/dL (ref 0.61–1.24)
GFR calc Af Amer: >60 mL/min (ref >60)
GFR calc non Af Amer: >60 mL/min (ref >60)
Glucose, Bld: 104 mg/dL — ABNORMAL HIGH (ref 70–99)
Potassium: 4 mmol/L (ref 3.5–5.1)
Sodium: 143 mmol/L (ref 135–145)
Total Bilirubin: 1.1 mg/dL (ref 0.3–1.2)
Total Protein: 6.2 g/dL — ABNORMAL LOW (ref 6.5–8.1)

## 2019-04-04 LAB — URINALYSIS, COMPLETE (UACMP) WITH MICROSCOPIC
Bacteria, UA: NONE SEEN
Bilirubin Urine: NEGATIVE
Glucose, UA: NEGATIVE mg/dL
Hgb urine dipstick: NEGATIVE
Ketones, ur: NEGATIVE mg/dL
Leukocytes,Ua: NEGATIVE
Nitrite: NEGATIVE
Protein, ur: NEGATIVE mg/dL
Specific Gravity, Urine: 1.025 (ref 1.005–1.030)
Squamous Epithelial / HPF: NONE SEEN (ref 0–5)
pH: 6 (ref 5.0–8.0)

## 2019-04-04 LAB — CBC
HCT: 40.7 % (ref 39.0–52.0)
Hemoglobin: 13.3 g/dL (ref 13.0–17.0)
MCH: 30.5 pg (ref 26.0–34.0)
MCHC: 32.7 g/dL (ref 30.0–36.0)
MCV: 93.3 fL (ref 80.0–100.0)
Platelets: 161 10*3/uL (ref 150–400)
RBC: 4.36 MIL/uL (ref 4.22–5.81)
RDW: 15.3 % (ref 11.5–15.5)
WBC: 13.7 10*3/uL — ABNORMAL HIGH (ref 4.0–10.5)
nRBC: 0 % (ref 0.0–0.2)

## 2019-04-04 LAB — LIPASE, BLOOD: Lipase: 27 U/L (ref 11–51)

## 2019-04-04 NOTE — ED Triage Notes (Signed)
Pt to ED reporting pain in his LLQ and left flank with "swelling." Constipation x 2 days without changes in urine. Pt is scheduled for a surgery on his vertebrae tomorrow.

## 2019-04-05 ENCOUNTER — Ambulatory Visit: Payer: Medicare Other

## 2019-04-05 ENCOUNTER — Ambulatory Visit: Payer: Medicare Other | Admitting: Anesthesiology

## 2019-04-05 ENCOUNTER — Encounter: Payer: Self-pay | Admitting: Anesthesiology

## 2019-04-05 ENCOUNTER — Encounter
Admission: RE | Disposition: A | Discharge status: Home / Self Care | Payer: Self-pay | Source: Home / Self Care | Attending: Orthopedic Surgery

## 2019-04-05 ENCOUNTER — Other Ambulatory Visit: Payer: Self-pay

## 2019-04-05 ENCOUNTER — Ambulatory Visit
Admission: RE | Admit: 2019-04-05 | Discharge: 2019-04-05 | Disposition: A | Discharge status: Home / Self Care | Payer: Medicare Other | Attending: Orthopedic Surgery | Admitting: Orthopedic Surgery

## 2019-04-05 DIAGNOSIS — S32050A Wedge compression fracture of fifth lumbar vertebra, initial encounter for closed fracture: Secondary | ICD-10-CM | POA: Diagnosis not present

## 2019-04-05 DIAGNOSIS — Z87891 Personal history of nicotine dependence: Secondary | ICD-10-CM | POA: Insufficient documentation

## 2019-04-05 DIAGNOSIS — G709 Myoneural disorder, unspecified: Secondary | ICD-10-CM | POA: Insufficient documentation

## 2019-04-05 DIAGNOSIS — Z6832 Body mass index (BMI) 32.0-32.9, adult: Secondary | ICD-10-CM | POA: Diagnosis not present

## 2019-04-05 DIAGNOSIS — I2510 Atherosclerotic heart disease of native coronary artery without angina pectoris: Secondary | ICD-10-CM | POA: Insufficient documentation

## 2019-04-05 DIAGNOSIS — E039 Hypothyroidism, unspecified: Secondary | ICD-10-CM | POA: Insufficient documentation

## 2019-04-05 DIAGNOSIS — K449 Diaphragmatic hernia without obstruction or gangrene: Secondary | ICD-10-CM | POA: Diagnosis not present

## 2019-04-05 DIAGNOSIS — J449 Chronic obstructive pulmonary disease, unspecified: Secondary | ICD-10-CM | POA: Diagnosis not present

## 2019-04-05 DIAGNOSIS — F0390 Unspecified dementia without behavioral disturbance: Secondary | ICD-10-CM | POA: Insufficient documentation

## 2019-04-05 DIAGNOSIS — I10 Essential (primary) hypertension: Secondary | ICD-10-CM | POA: Insufficient documentation

## 2019-04-05 DIAGNOSIS — S32030A Wedge compression fracture of third lumbar vertebra, initial encounter for closed fracture: Secondary | ICD-10-CM | POA: Diagnosis not present

## 2019-04-05 DIAGNOSIS — K219 Gastro-esophageal reflux disease without esophagitis: Secondary | ICD-10-CM | POA: Insufficient documentation

## 2019-04-05 DIAGNOSIS — Z888 Allergy status to other drugs, medicaments and biological substances status: Secondary | ICD-10-CM | POA: Insufficient documentation

## 2019-04-05 DIAGNOSIS — I4891 Unspecified atrial fibrillation: Secondary | ICD-10-CM | POA: Diagnosis not present

## 2019-04-05 DIAGNOSIS — I739 Peripheral vascular disease, unspecified: Secondary | ICD-10-CM | POA: Insufficient documentation

## 2019-04-05 DIAGNOSIS — G4730 Sleep apnea, unspecified: Secondary | ICD-10-CM | POA: Diagnosis not present

## 2019-04-05 DIAGNOSIS — S22080A Wedge compression fracture of T11-T12 vertebra, initial encounter for closed fracture: Secondary | ICD-10-CM | POA: Diagnosis not present

## 2019-04-05 DIAGNOSIS — Z951 Presence of aortocoronary bypass graft: Secondary | ICD-10-CM | POA: Diagnosis not present

## 2019-04-05 DIAGNOSIS — Z7901 Long term (current) use of anticoagulants: Secondary | ICD-10-CM | POA: Diagnosis not present

## 2019-04-05 DIAGNOSIS — Z885 Allergy status to narcotic agent status: Secondary | ICD-10-CM | POA: Insufficient documentation

## 2019-04-05 DIAGNOSIS — Z981 Arthrodesis status: Secondary | ICD-10-CM | POA: Diagnosis not present

## 2019-04-05 DIAGNOSIS — Z79899 Other long term (current) drug therapy: Secondary | ICD-10-CM | POA: Insufficient documentation

## 2019-04-05 DIAGNOSIS — Z7951 Long term (current) use of inhaled steroids: Secondary | ICD-10-CM | POA: Diagnosis not present

## 2019-04-05 DIAGNOSIS — E669 Obesity, unspecified: Secondary | ICD-10-CM | POA: Diagnosis not present

## 2019-04-05 DIAGNOSIS — S32000A Wedge compression fracture of unspecified lumbar vertebra, initial encounter for closed fracture: Secondary | ICD-10-CM | POA: Diagnosis not present

## 2019-04-05 DIAGNOSIS — W19XXXA Unspecified fall, initial encounter: Secondary | ICD-10-CM | POA: Diagnosis not present

## 2019-04-05 DIAGNOSIS — S32040A Wedge compression fracture of fourth lumbar vertebra, initial encounter for closed fracture: Secondary | ICD-10-CM | POA: Diagnosis not present

## 2019-04-05 DIAGNOSIS — D649 Anemia, unspecified: Secondary | ICD-10-CM | POA: Insufficient documentation

## 2019-04-05 DIAGNOSIS — Z419 Encounter for procedure for purposes other than remedying health state, unspecified: Secondary | ICD-10-CM

## 2019-04-05 HISTORY — PX: KYPHOPLASTY: SHX5884

## 2019-04-05 SURGERY — KYPHOPLASTY
Anesthesia: Monitor Anesthesia Care

## 2019-04-05 MED ORDER — CEFAZOLIN SODIUM-DEXTROSE 2-4 GM/100ML-% IV SOLN
INTRAVENOUS | Status: AC
  Filled 2019-04-05: qty 100

## 2019-04-05 MED ORDER — CEFAZOLIN SODIUM-DEXTROSE 2-4 GM/100ML-% IV SOLN: 2.0000 g | Freq: Once | INTRAVENOUS | Status: DC

## 2019-04-05 MED ORDER — PROPOFOL 10 MG/ML IV BOLUS
INTRAVENOUS | Status: AC
  Filled 2019-04-05: qty 20

## 2019-04-05 MED ORDER — LIDOCAINE HCL (PF) 1 % IJ SOLN
INTRAMUSCULAR | Status: AC
  Filled 2019-04-05: qty 60

## 2019-04-05 MED ORDER — FAMOTIDINE 20 MG PO TABS
20.0000 mg | ORAL_TABLET | Freq: Once | ORAL | Status: AC
  Administered 2019-04-05: 20 mg via ORAL

## 2019-04-05 MED ORDER — FAMOTIDINE 20 MG PO TABS
ORAL_TABLET | ORAL | Status: AC
  Administered 2019-04-05: 20 mg via ORAL
  Filled 2019-04-05: qty 1

## 2019-04-05 MED ORDER — PROPOFOL 500 MG/50ML IV EMUL
INTRAVENOUS | Status: DC | PRN
  Administered 2019-04-05: 25 ug/kg/min via INTRAVENOUS

## 2019-04-05 MED ORDER — PROPOFOL 10 MG/ML IV BOLUS
INTRAVENOUS | Status: DC | PRN
  Administered 2019-04-05 (×3): 10 mg via INTRAVENOUS

## 2019-04-05 MED ORDER — LIDOCAINE HCL 1 % IJ SOLN
INTRAMUSCULAR | Status: DC | PRN
  Administered 2019-04-05: 50 mL

## 2019-04-05 MED ORDER — FENTANYL CITRATE (PF) 100 MCG/2ML IJ SOLN: 25.0000 ug | INTRAMUSCULAR | Status: DC | PRN

## 2019-04-05 MED ORDER — CEFAZOLIN SODIUM-DEXTROSE 2-3 GM-%(50ML) IV SOLR
INTRAVENOUS | Status: DC | PRN
  Administered 2019-04-05: 2 g via INTRAVENOUS

## 2019-04-05 MED ORDER — FENTANYL CITRATE (PF) 100 MCG/2ML IJ SOLN
INTRAMUSCULAR | Status: AC
  Filled 2019-04-05: qty 2

## 2019-04-05 MED ORDER — FENTANYL CITRATE (PF) 100 MCG/2ML IJ SOLN
INTRAMUSCULAR | Status: DC | PRN
  Administered 2019-04-05 (×4): 25 ug via INTRAVENOUS

## 2019-04-05 MED ORDER — BUPIVACAINE-EPINEPHRINE (PF) 0.5% -1:200000 IJ SOLN
INTRAMUSCULAR | Status: DC | PRN
  Administered 2019-04-05: 40 mL

## 2019-04-05 MED ORDER — BUPIVACAINE-EPINEPHRINE (PF) 0.5% -1:200000 IJ SOLN
INTRAMUSCULAR | Status: AC
  Filled 2019-04-05: qty 60

## 2019-04-05 MED ORDER — MIDAZOLAM HCL 5 MG/5ML IJ SOLN: INTRAMUSCULAR | Status: DC | PRN

## 2019-04-05 MED ORDER — IOPAMIDOL (ISOVUE-M 200) INJECTION 41%: INTRAMUSCULAR | Status: DC | PRN

## 2019-04-05 MED ORDER — BUPIVACAINE-EPINEPHRINE (PF) 0.5% -1:200000 IJ SOLN
INTRAMUSCULAR | Status: AC
  Filled 2019-04-05: qty 30

## 2019-04-05 MED ORDER — IOHEXOL 180 MG/ML  SOLN
INTRAMUSCULAR | Status: DC | PRN
  Administered 2019-04-05: 80 mL

## 2019-04-05 MED ORDER — ONDANSETRON HCL 4 MG/2ML IJ SOLN: 4.0000 mg | Freq: Once | INTRAMUSCULAR | Status: DC | PRN

## 2019-04-05 MED ORDER — LACTATED RINGERS IV SOLN
INTRAVENOUS | Status: DC
  Administered 2019-04-05: 12:00:00 via INTRAVENOUS

## 2019-04-05 SURGICAL SUPPLY — 23 items
ADH SKN CLS APL DERMABOND .7 (GAUZE/BANDAGES/DRESSINGS) ×2
CEMENT KYPHON CX01A KIT/MIXER (Cement) ×2 IMPLANT
COVER WAND RF STERILE (DRAPES) ×2 IMPLANT
DERMABOND ADVANCED (GAUZE/BANDAGES/DRESSINGS) ×2
DERMABOND ADVANCED .7 DNX12 (GAUZE/BANDAGES/DRESSINGS) ×1 IMPLANT
DEVICE BIOPSY BONE KYPHX (INSTRUMENTS) ×2 IMPLANT
DRAPE C-ARM XRAY 36X54 (DRAPES) ×2 IMPLANT
DURAPREP 26ML APPLICATOR (WOUND CARE) ×2 IMPLANT
FEE RENTAL RFA GENERATOR (MISCELLANEOUS) IMPLANT
GLOVE SURG SYN 9.0  PF PI (GLOVE) ×1
GLOVE SURG SYN 9.0 PF PI (GLOVE) ×1 IMPLANT
GOWN SRG 2XL LVL 4 RGLN SLV (GOWNS) ×1 IMPLANT
GOWN STRL NON-REIN 2XL LVL4 (GOWNS) ×2
GOWN STRL REUS W/ TWL LRG LVL3 (GOWN DISPOSABLE) ×1 IMPLANT
GOWN STRL REUS W/TWL LRG LVL3 (GOWN DISPOSABLE) ×2
PACK KYPHOPLASTY (MISCELLANEOUS) ×2 IMPLANT
RENTAL RFA GENERATOR (MISCELLANEOUS) IMPLANT
STRAP SAFETY 5IN WIDE (MISCELLANEOUS) ×2 IMPLANT
SYS CARTRIDGE BONE CEMENT 8ML (SYSTAGENIX WOUND MANAGEMENT) ×2
SYSTEM CARTRIDG BONE CEMNT 8ML (SYSTAGENIX WOUND MANAGEMENT) IMPLANT
SYSTEM GUN BONE FILLER SZ2 (MISCELLANEOUS) ×1 IMPLANT
TRAY KYPHOPAK 15/3 EXPRESS 1ST (MISCELLANEOUS) ×2 IMPLANT
TRAY KYPHOPAK 20/3 EXPRESS 1ST (MISCELLANEOUS) ×2 IMPLANT

## 2019-04-05 NOTE — Discharge Instructions (Addendum)
Take it easy today and tomorrow and resume more normal activities on Thursday.  Try not to lift anything over 5 pounds for 2 weeks.  Remove Band-Aids on Thursday then okay to shower.   AMBULATORY SURGERY  DISCHARGE INSTRUCTIONS   1) The drugs that you were given will stay in your system until tomorrow so for the next 24 hours you should not:  A) Drive an automobile B) Make any legal decisions C) Drink any alcoholic beverage   2) You may resume regular meals tomorrow.  Today it is better to start with liquids and gradually work up to solid foods.  You may eat anything you prefer, but it is better to start with liquids, then soup and crackers, and gradually work up to solid foods.   3) Please notify your doctor immediately if you have any unusual bleeding, trouble breathing, redness and pain at the surgery site, drainage, fever, or pain not relieved by medication.    4) Additional Instructions:        Please contact your physician with any problems or Same Day Surgery at [phone], Monday through Friday 6 am to 4 pm, or [place] at [facility] number at [phone].

## 2019-04-05 NOTE — Transfer of Care (Signed)
Immediate Anesthesia Transfer of Care Note  Patient: BRADLY SANGIOVANNI  Procedure(s) Performed: KYPHOPLASTY T12, L3, L4 L5 (N/A )  Patient Location: PACU  Anesthesia Type:MAC  Level of Consciousness: awake, alert  and oriented  Airway & Oxygen Therapy: Patient Spontanous Breathing and Patient connected to nasal cannula oxygen  Post-op Assessment: Report given to RN and Post -op Vital signs reviewed and stable  Post vital signs: Reviewed and stable  Last Vitals:  Vitals Value Taken Time  BP    Temp    Pulse 99 04/05/19 1406  Resp 16 04/05/19 1406  SpO2 100 % 04/05/19 1406  Vitals shown include unvalidated device data.  Last Pain:  Vitals:   04/05/19 1147  TempSrc: Oral  PainSc: 5          Complications: No apparent anesthesia complications

## 2019-04-05 NOTE — Anesthesia Preprocedure Evaluation (Signed)
Anesthesia Evaluation  Patient identified by MRN, date of birth, ID band Patient awake    Reviewed: Allergy & Precautions, NPO status , Patient's Chart, lab work & pertinent test results, reviewed documented beta blocker date and time   Airway Mallampati: III  TM Distance: >3 FB     Dental  (+) Chipped   Pulmonary sleep apnea and Continuous Positive Airway Pressure Ventilation , COPD, former smoker,           Cardiovascular hypertension, Pt. on medications and Pt. on home beta blockers + CAD, + CABG and + Peripheral Vascular Disease  + dysrhythmias Atrial Fibrillation      Neuro/Psych PSYCHIATRIC DISORDERS Dementia  Neuromuscular disease    GI/Hepatic hiatal hernia, GERD  Controlled,  Endo/Other  Hypothyroidism   Renal/GU      Musculoskeletal  (+) Arthritis ,   Abdominal   Peds  Hematology  (+) anemia ,   Anesthesia Other Findings Obese. EKG shows iRbbb.  Reproductive/Obstetrics                             Anesthesia Physical Anesthesia Plan  ASA: II  Anesthesia Plan: General and MAC   Post-op Pain Management:    Induction: Intravenous  PONV Risk Score and Plan:   Airway Management Planned:   Additional Equipment:   Intra-op Plan:   Post-operative Plan:   Informed Consent: I have reviewed the patients History and Physical, chart, labs and discussed the procedure including the risks, benefits and alternatives for the proposed anesthesia with the patient or authorized representative who has indicated his/her understanding and acceptance.       Plan Discussed with: CRNA  Anesthesia Plan Comments:         Anesthesia Quick Evaluation

## 2019-04-05 NOTE — Op Note (Signed)
Date 02/03/2019  Time  2:10 PM   PATIENT:  [name]   PRE-OPERATIVE DIAGNOSIS:  closed wedge compression fracture of T12, L3, L4, L5   POST-OPERATIVE DIAGNOSIS:  closed wedge compression fracture of T12, L3, L4, L5   PROCEDURE:  Procedure(s): KYPHOPLASTY T12, L3, L4, L5  SURGEON: Laurene Footman, MD   ASSISTANTS: None   ANESTHESIA:   local and MAC   EBL:  No intake/output data recorded.   BLOOD ADMINISTERED:none   DRAINS: none    LOCAL MEDICATIONS USED:  MARCAINE    and XYLOCAINE    SPECIMEN:   L3-4 and 5 vertebral body biopsies   DISPOSITION OF SPECIMEN:  Pathology   COUNTS:  YES   TOURNIQUET:  * No tourniquets in log *   IMPLANTS: Bone cement   DICTATION: .Dragon Dictation  patient was brought to the operating room and after adequate anesthesia was obtained the patient was placed prone.  C arm was brought in in good visualization of the affected levels I believe it test treatment, obtained on both AP and lateral projections.  After patient identification and timeout procedures were completed, local anesthetic was infiltrated with 10 cc 1% Xylocaine infiltrated subcutaneously.  This is done the area on the right side of the planned approach at T12 L3 and 5 the left side of L4.  The back was then prepped and draped you sterile manner and repeat timeout procedure carried out.  A spinal needle was brought down to the pedicle on the right side of  T12 L3 and 5 and the left side at L4 and a 50-50 mix of 1% Xylocaine half percent Sensorcaine with epinephrine total of 20 cc injected.  After allowing this to set a small incision was made and the trocar was advanced into the vertebral body in an extrapedicular fashion.  Biopsy was obtained at L3-4 and 5 but not at T12 Drilling was carried out balloon inserted with inflation to  2-3 cc at each level.  When the cement was appropriate consistency about 3 cc were injected into the vertebral body without extravasation, good fill superior  to inferior endplates and from right to left sides along the inferior endplate.  After the cement had set the trochars were removed and permanent C-arm views obtained.  The wounds were closed with Dermabond followed by Band-Aid   PLAN OF CARE: Discharge to home after PACU   PATIENT DISPOSITION:  PACU - hemodynamically stable.

## 2019-04-05 NOTE — Anesthesia Post-op Follow-up Note (Signed)
Anesthesia QCDR form completed.        

## 2019-04-05 NOTE — OR Nursing (Addendum)
Dr Marcello Moores aware patient did not have beta blocker this am, no new orders.  Patient alert and oriented X 4 and is able to state what procedure he is having today. [name] (patient's girlfriend) at bedside.  Dr Rudene Christians in to see patient, aware of pain that he has on left side.  Dr Rudene Christians assessed and instructed patient to take laxative.

## 2019-04-05 NOTE — Anesthesia Postprocedure Evaluation (Signed)
Anesthesia Post Note  Patient: [name]  Procedure(s) Performed: KYPHOPLASTY T12, L3, L4 L5 (N/A )  Patient location during evaluation: PACU Anesthesia Type: MAC Level of consciousness: awake and alert Pain management: pain level controlled Vital Signs Assessment: post-procedure vital signs reviewed and stable Respiratory status: spontaneous breathing, nonlabored ventilation, respiratory function stable and patient connected to nasal cannula oxygen Cardiovascular status: blood pressure returned to baseline and stable Postop Assessment: no apparent nausea or vomiting Anesthetic complications: no     Last Vitals:  Vitals:   04/05/19 1422 04/05/19 1437  BP: 131/72 117/68  Pulse: 93 90  Resp: 15 15  Temp:    SpO2: 97% 99%    Last Pain:  Vitals:   04/05/19 1422  TempSrc:   PainSc: 0-No pain                 Precious Haws Piscitello

## 2019-04-05 NOTE — H&P (Signed)
Reviewed paper H+P, will be scanned into chart. No changes noted.  

## 2019-04-06 ENCOUNTER — Encounter: Payer: Self-pay | Admitting: Orthopedic Surgery

## 2019-04-07 ENCOUNTER — Ambulatory Visit: Payer: Medicare Other | Admitting: Radiation Oncology

## 2019-04-07 LAB — SURGICAL PATHOLOGY

## 2019-04-10 DIAGNOSIS — N200 Calculus of kidney: Secondary | ICD-10-CM | POA: Diagnosis not present

## 2019-04-10 DIAGNOSIS — R3129 Other microscopic hematuria: Secondary | ICD-10-CM | POA: Diagnosis not present

## 2019-04-10 DIAGNOSIS — R110 Nausea: Secondary | ICD-10-CM | POA: Diagnosis not present

## 2019-04-10 DIAGNOSIS — J9811 Atelectasis: Secondary | ICD-10-CM | POA: Diagnosis not present

## 2019-04-10 DIAGNOSIS — R1032 Left lower quadrant pain: Secondary | ICD-10-CM | POA: Diagnosis not present

## 2019-04-11 ENCOUNTER — Telehealth: Payer: Self-pay | Admitting: Family Medicine

## 2019-04-11 DIAGNOSIS — R319 Hematuria, unspecified: Secondary | ICD-10-CM

## 2019-04-11 DIAGNOSIS — R109 Unspecified abdominal pain: Secondary | ICD-10-CM

## 2019-04-11 NOTE — Telephone Encounter (Signed)
Order for abd ct has been placed

## 2019-04-11 NOTE — Telephone Encounter (Signed)
[name] advised.   Thanks,   -[name]

## 2019-04-11 NOTE — Telephone Encounter (Signed)
Patient was seen at [facility] yesterday for a kidney stone (they think)   He had blood in his urine and the doctor who seen him wanted you to order a CT scan to see if he does have kidney stones.   [name] said he is in A LOT of pain.

## 2019-04-12 ENCOUNTER — Emergency Department: Payer: Medicare Other

## 2019-04-12 ENCOUNTER — Emergency Department
Admission: EM | Admit: 2019-04-12 | Discharge: 2019-04-12 | Disposition: A | Discharge status: Home / Self Care | Payer: Medicare Other | Attending: Emergency Medicine | Admitting: Emergency Medicine

## 2019-04-12 ENCOUNTER — Encounter: Payer: Self-pay | Admitting: Emergency Medicine

## 2019-04-12 ENCOUNTER — Other Ambulatory Visit: Payer: Self-pay

## 2019-04-12 ENCOUNTER — Telehealth: Payer: Self-pay

## 2019-04-12 DIAGNOSIS — Z85828 Personal history of other malignant neoplasm of skin: Secondary | ICD-10-CM | POA: Diagnosis not present

## 2019-04-12 DIAGNOSIS — I10 Essential (primary) hypertension: Secondary | ICD-10-CM | POA: Diagnosis not present

## 2019-04-12 DIAGNOSIS — Z8546 Personal history of malignant neoplasm of prostate: Secondary | ICD-10-CM | POA: Diagnosis not present

## 2019-04-12 DIAGNOSIS — I714 Abdominal aortic aneurysm, without rupture, unspecified: Secondary | ICD-10-CM

## 2019-04-12 DIAGNOSIS — K5730 Diverticulosis of large intestine without perforation or abscess without bleeding: Secondary | ICD-10-CM | POA: Diagnosis not present

## 2019-04-12 DIAGNOSIS — Z7901 Long term (current) use of anticoagulants: Secondary | ICD-10-CM | POA: Insufficient documentation

## 2019-04-12 DIAGNOSIS — I2510 Atherosclerotic heart disease of native coronary artery without angina pectoris: Secondary | ICD-10-CM | POA: Diagnosis not present

## 2019-04-12 DIAGNOSIS — Z951 Presence of aortocoronary bypass graft: Secondary | ICD-10-CM | POA: Diagnosis not present

## 2019-04-12 DIAGNOSIS — E039 Hypothyroidism, unspecified: Secondary | ICD-10-CM | POA: Insufficient documentation

## 2019-04-12 DIAGNOSIS — Z87891 Personal history of nicotine dependence: Secondary | ICD-10-CM | POA: Diagnosis not present

## 2019-04-12 DIAGNOSIS — R1032 Left lower quadrant pain: Secondary | ICD-10-CM | POA: Diagnosis present

## 2019-04-12 DIAGNOSIS — N200 Calculus of kidney: Secondary | ICD-10-CM | POA: Insufficient documentation

## 2019-04-12 DIAGNOSIS — Z79899 Other long term (current) drug therapy: Secondary | ICD-10-CM | POA: Diagnosis not present

## 2019-04-12 LAB — COMPREHENSIVE METABOLIC PANEL
ALT: 18 U/L (ref 0–44)
AST: 19 U/L (ref 15–41)
Albumin: 3.7 g/dL (ref 3.5–5.0)
Alkaline Phosphatase: 149 U/L — ABNORMAL HIGH (ref 38–126)
Anion gap: 8 (ref 5–15)
BUN: 12 mg/dL (ref 8–23)
CO2: 23 mmol/L (ref 22–32)
Calcium: 8.9 mg/dL (ref 8.9–10.3)
Chloride: 109 mmol/L (ref 98–111)
Creatinine, Ser: 0.83 mg/dL (ref 0.61–1.24)
GFR calc Af Amer: >60 mL/min (ref >60)
GFR calc non Af Amer: >60 mL/min (ref >60)
Glucose, Bld: 115 mg/dL — ABNORMAL HIGH (ref 70–99)
Potassium: 3.6 mmol/L (ref 3.5–5.1)
Sodium: 140 mmol/L (ref 135–145)
Total Bilirubin: 0.7 mg/dL (ref 0.3–1.2)
Total Protein: 6.6 g/dL (ref 6.5–8.1)

## 2019-04-12 LAB — URINALYSIS, COMPLETE (UACMP) WITH MICROSCOPIC
Bacteria, UA: NONE SEEN
Bilirubin Urine: NEGATIVE
Glucose, UA: NEGATIVE mg/dL
Hgb urine dipstick: NEGATIVE
Ketones, ur: 5 mg/dL — AB
Leukocytes,Ua: NEGATIVE
Nitrite: NEGATIVE
Protein, ur: NEGATIVE mg/dL
Specific Gravity, Urine: 1.018 (ref 1.005–1.030)
pH: 6 (ref 5.0–8.0)

## 2019-04-12 LAB — LIPASE, BLOOD: Lipase: 32 U/L (ref 11–51)

## 2019-04-12 LAB — CBC
HCT: 37.9 % — ABNORMAL LOW (ref 39.0–52.0)
Hemoglobin: 12.4 g/dL — ABNORMAL LOW (ref 13.0–17.0)
MCH: 30.4 pg (ref 26.0–34.0)
MCHC: 32.7 g/dL (ref 30.0–36.0)
MCV: 92.9 fL (ref 80.0–100.0)
Platelets: 193 10*3/uL (ref 150–400)
RBC: 4.08 MIL/uL — ABNORMAL LOW (ref 4.22–5.81)
RDW: 15.1 % (ref 11.5–15.5)
WBC: 8.8 10*3/uL (ref 4.0–10.5)
nRBC: 0 % (ref 0.0–0.2)

## 2019-04-12 MED ORDER — IOHEXOL 240 MG/ML SOLN
50.0000 mL | Freq: Once | INTRAMUSCULAR | Status: DC | PRN
  Filled 2019-04-12: qty 50

## 2019-04-12 MED ORDER — IOHEXOL 300 MG/ML  SOLN
100.0000 mL | Freq: Once | INTRAMUSCULAR | Status: AC | PRN
  Administered 2019-04-12: 100 mL via INTRAVENOUS
  Filled 2019-04-12: qty 100

## 2019-04-12 MED ORDER — IOHEXOL 240 MG/ML SOLN
50.0000 mL | Freq: Once | INTRAMUSCULAR | Status: AC | PRN
  Administered 2019-04-12: 50 mL via ORAL
  Filled 2019-04-12: qty 50

## 2019-04-12 NOTE — ED Triage Notes (Signed)
Patient reports LLQ abdominal pain x2 weeks. States he treated possible constipation at home with no relief. Normal BM yesterday. Patient had previously scheduled back surgery on 8/4 as well. Patient seen at [facility] 8/3 and had xray, labs, urine done showing only hematuria. Patient saw PCP who scheduled outpatient CT that has not been done. Also reports decreased appetite and approx 30lb weight loss in the last month.

## 2019-04-12 NOTE — Telephone Encounter (Signed)
Yes agreed if he is in that much pain he needs to go to ER for urgent evaluation.

## 2019-04-12 NOTE — Telephone Encounter (Signed)
Patient's wife is requesting the status on the CT scan that was ordered. She states patient is in severe pain in lower abdomen and now has loss of appetite. He is hollering at. Wife states "he will not make it through the night with the pain" [name] patient's wife advised that patient needs to go to the ER for evaluation. Advised her that they can do a CT scan there if needed.

## 2019-04-12 NOTE — ED Provider Notes (Signed)
[facility] Emergency Department Provider Note ___   First MD Initiated Contact with Patient 04/12/19 1620     (approximate)  I have reviewed the triage vital signs and the nursing notes.   HISTORY  Chief Complaint Abdominal Pain   HPI [name] is a 85 y.o. male with below list of previous medical conditions including back surgery on 04/05/2019 presents to the emergency department secondary to 2-week history of left lower quadrant/left flank pain patient states that he thought this was secondary constipation which he is treated at home without relief.  Patient states he had a normal bowel movement yesterday.  Patient denies any nausea vomiting diarrhea.  Patient denies any fever.  Patient does admit to hematuria noted at his primary care provider's office for which he was prescribed an antibiotic.  Patient states that he is scheduled for an outpatient CT scan however given discomfort today he presented to the emergency department.  Patient also admits to a 30 pound weight loss over the last month.  Patient denies any pain at present        Past Medical History:  Diagnosis Date   Arthritis    Atherosclerotic heart disease of native coronary artery without angina pectoris 1998   - s/p CABG, occluded SVG-D1; patent LIMA-LAD, SVG-OM, SVG- RCA   Basal cell carcinoma of eyelid 01/03/2013   2019 - R side of Nose   Benign neoplasm of ascending colon    Benign neoplasm of descending colon    Cancer ([place])    Skin cancer   Colon polyps    COPD (chronic obstructive pulmonary disease) (HCC)    Coronary artery disease    Dementia (HCC)    Dysrhythmia    atrial fib   GERD (gastroesophageal reflux disease)    Hemorrhoid 02/10/2015   History of hiatal hernia    HOH (hard of hearing)    Hypertension    Memory loss    Osteoporosis    PAF (paroxysmal atrial fibrillation) (HCC)    Prostate cancer ([place])    Prostate disorder    Sciatic  pain    Chronic   Sleep apnea    cpap   Temporary cerebral vascular dysfunction 02/10/2015   Had negative work-up.  July, 2012.  No medication changes, had forgot them that day, got dehydrated.    Urinary incontinence     Patient Active Problem List   Diagnosis Date Noted   Malignant neoplasm of descending colon ([place])    Benign neoplasm of descending colon    Benign neoplasm of transverse colon    Do not intubate, cardiopulmonary resuscitation (CPR)-only code status 10/08/2018   Prostate cancer ([place]) 02/25/2018   Prostatic cyst 01/15/2018   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 ([place]) 09/15/2017   Blood in stool    Abnormal feces    Stricture and stenosis of esophagus    Mild cognitive impairment 08/18/2017   Senile purpura ([place]) 04/29/2017   Anemia 04/29/2017   Frequent falls 04/29/2017   Benign localized hyperplasia of prostate with urinary obstruction 07/15/2016   Elevated PSA 07/15/2016   Incomplete emptying of bladder 07/15/2016   Urge incontinence 07/15/2016   Hypothyroidism 04/25/2016   Macular degeneration 04/25/2016   Erectile dysfunction due to arterial insufficiency    Ischemic heart disease with chronotropic incompetence    CN (constipation) 02/10/2015   DD (diverticular disease) 02/10/2015   Fatigue 09/64/3838   Lichen planus 18/40/3754   Restless leg 02/10/2015   Circadian rhythm disorder 02/10/2015  B12 deficiency 02/10/2015   COPD (chronic obstructive pulmonary disease) ([place]) 11/14/2014   Post Inflammatory Lung Changes 11/14/2014   PAF (paroxysmal atrial fibrillation) (HCC) CHA2DS2-VASc = 4. AC = Eliquis    Insomnia 12/09/2013   OSA on CPAP    Dyspnea on exertion - -essentially resolved with BB dose reduction & wgt loss 03/29/2013   Overweight (BMI 25.0-29.9) -- Wgt back up 01/17/2013   Atherosclerotic heart disease of native coronary artery without angina pectoris - s/p CABG, occluded SVG-D1; patent LIMA-LAD, SVG-OM,  SVG- RCA    Essential hypertension    Hyperlipidemia with target LDL less than 70    Aorto-iliac disease ([place])    BCC (basal cell carcinoma), eyelid 01/03/2013   Allergic rhinitis 08/17/2009   Adaptation reaction 05/28/2009   Acid reflux 05/28/2009   Arthritis, degenerative 05/28/2009   Abnormality of aortic arch branch 06/01/2006   Carotid artery occlusion without infarction 06/01/2006   PAD (peripheral artery disease) - bilateral common iliac stents 12/15/2005   Aortoiliac occlusive disease ([place]) 11/30/2005   Atherosclerotic heart disease of artery bypass graft 09/01/1997    Past Surgical History:  Procedure Laterality Date   ANKLE SURGERY     Arch aortogram and carotid aortogram  06/02/2006   Dr Oneida Alar did surgery   BASAL CELL CARCINOMA EXCISION  04/2016   Dermatology   CARDIAC CATHETERIZATION  01/30/1998    (Dr. Linard Millers): Native LAD & mRCA CTO. 90% D1. Mod Cx. SVG-D1 CTO. SVG-RCA & SVG-OM along with LIMA-LAD patent;  LV NORMAL Fxn   CAROTID ENDARTERECTOMY Left Oct. 29, 2007   Dr. Oneida Alar:   CATARACT EXTRACTION W/PHACO Right 03/05/2017   Procedure: CATARACT EXTRACTION PHACO AND INTRAOCULAR LENS PLACEMENT ([place]);  Surgeon: Leandrew Koyanagi, MD;  Location: [facility] ORS;  Service: Ophthalmology;  Laterality: Right;  [place] 00:58.5 AP% 10.6 CDE 6.20 Fluid Pack lot # [id number]   CATARACT EXTRACTION W/PHACO Left 04/15/2017   Procedure: CATARACT EXTRACTION PHACO AND INTRAOCULAR LENS PLACEMENT ([place])  Left;  Surgeon: Leandrew Koyanagi, MD;  Location: [place];  Service: Ophthalmology;  Laterality: Left;   COLONOSCOPY WITH PROPOFOL N/A 09/15/2017   Procedure: COLONOSCOPY WITH PROPOFOL;  Surgeon: Lucilla Lame, MD;  Location[facility];  Service: Endoscopy;  Laterality: N/A;   COLONOSCOPY WITH PROPOFOL N/A 10/26/2018   Procedure: COLONOSCOPY WITH PROPOFOL;  Surgeon: Lucilla Lame, MD;  Location: [facility] ENDOSCOPY;  Service: Endoscopy;  Laterality: N/A;    CORONARY ARTERY BYPASS GRAFT  1998   LIMA-LAD, SVG-OM, SVG-RPDA, SVG-DIAG   CPET / MET  12/2012   Mild chronotropic incompetence - read 82% of predicted; also reduced effort; peak VO2 15.7 / 75% (did not reach Max effort) -- suggested ischemic response in last 1.5 minutes of exercise. Normal pulmonary function on PFTs but poor response to   DOPPLER ECHOCARDIOGRAPHY  11/20/2010   EF =>55%; LV norm mild aortic scelorosis   ESOPHAGOGASTRODUODENOSCOPY (EGD) WITH PROPOFOL N/A 09/15/2017   Procedure: ESOPHAGOGASTRODUODENOSCOPY (EGD) WITH PROPOFOL;  Surgeon: Lucilla Lame, MD;  Location[facility] ENDOSCOPY;  Service: Endoscopy;  Laterality: N/A;   FRACTURE SURGERY Right 03/19/2015   wrist   HIATAL HERNIA REPAIR     ILIAC ARTERY STENT  12/15/2005   PTA and direct stenting rgt and lft common iliac arteries   KYPHOPLASTY N/A 04/05/2019   Procedure: KYPHOPLASTY T12, L3, L4 L5;  Surgeon: Hessie Knows, MD;  Location: [facility] ORS;  Service: Orthopedics;  Laterality: N/A;   NM GATED MYOVIEW ([place] [facility])  12/2017   Low Risk - no ischemia  or infarct.  EF > 65%. no RWMA   NM MYOVIEW ([facility] HX)  11/11/2010; February 2016   a) TM STRESS----NORMAL PERFUSION ,EF 68%; b) EF 50-55%. Normal LV function. No significant ischemia or infarction.   OPEN REDUCTION INTERNAL FIXATION (ORIF) DISTAL RADIAL FRACTURE Left 01/13/2019   Procedure: OPEN REDUCTION INTERNAL FIXATION (ORIF) DISTAL  RADIAL FRACTURE - LEFT - SLEEP APNEA;  Surgeon: Hessie Knows, MD;  Location: [facility] ORS;  Service: Orthopedics;  Laterality: Left;   ORIF ELBOW FRACTURE Right 01/01/2017   Procedure: OPEN REDUCTION INTERNAL FIXATION (ORIF) ELBOW/OLECRANON FRACTURE;  Surgeon: Hessie Knows, MD;  Location: [facility] ORS;  Service: Orthopedics;  Laterality: Right;   ORIF WRIST FRACTURE Right 03/19/2015   Procedure: OPEN REDUCTION INTERNAL FIXATION (ORIF) WRIST FRACTURE;  Surgeon: Hessie Knows, MD;  Location: [facility] ORS;  Service: Orthopedics;  Laterality: Right;   THORACIC  AORTA - CAROTID ANGIOGRAM  October 2007   Dr. 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 Dilated left atrium.    Prior to Admission medications   Medication Sig Start Date End Date Taking? Authorizing Provider  acetaminophen (TYLENOL) 500 MG tablet Take 500-1,000 mg by mouth every 6 (six) hours as needed (for pain.).    [provider]  ADVAIR DISKUS 250-50 MCG/DOSE AEPB INHALE ONE PUFF BY MOUTH TWICE DAILY AS DIRECTED (RINSE MOUTH AFTER USE) Patient taking differently: Inhale 1 puff into the lungs 2 (two) times a day.  02/14/19   Laverle Hobby, MD  Ascorbic Acid (VITAMIN C) 1000 MG tablet Take 1,000 mg by mouth daily.    [provider]  Cholecalciferol (VITAMIN D-3 PO) Take 1,000 Units by mouth every evening.     [provider]  Cyanocobalamin (VITAMIN B-12) 500 MCG SUBL Place 500 mcg under the tongue daily.     [provider]  donepezil (ARICEPT) 10 MG tablet Take 10 mg by mouth at bedtime.  11/18/17   [provider]  ELIQUIS 5 MG TABS tablet TAKE 1 TABLET BY MOUTH TWICE DAILY Patient taking differently: Take 5 mg by mouth 2 (two) times daily.  01/14/19   Leonie Man, MD  ezetimibe (ZETIA) 10 MG tablet TAKE 1 TABLET BY MOUTH DAILY Patient taking differently: Take 10 mg by mouth every evening.  05/12/18   [name], MD  finasteride (PROSCAR) 5 MG tablet Take 1 tablet (5 mg total) by mouth daily. 01/31/19   Stoioff, Ronda Fairly, MD  fluticasone (FLONASE) 50 MCG/ACT nasal spray Place 2 sprays into both nostrils daily as needed for allergies. Patient taking differently: Place 2 sprays into both nostrils daily.  11/22/18   [name], MD  lisinopril (PRINIVIL,ZESTRIL) 2.5 MG tablet TAKE ONE TABLET BY MOUTH TWICE DAILY Patient taking differently: Take 2.5 mg by mouth 2 (two)  times a day.  10/19/18   [name], MD  Magnesium Oxide 500 MG TABS Take 500 mg by mouth every evening.    [provider]  metoprolol tartrate (LOPRESSOR) 25 MG tablet TAKE ONE TABLET BY MOUTH TWICE DAILY Patient taking differently: Take 25 mg by mouth 2 (two) times daily.  11/10/18   Leonie Man, MD  mirabegron ER (MYRBETRIQ) 50 MG TB24 tablet Take 1 tablet (50 mg total) by mouth daily. Patient taking differently: Take 50 mg by mouth every evening.  12/15/18   Stoioff, Ronda Fairly, MD  Multiple Vitamins-Minerals (PRESERVISION AREDS 2 PO) Take 1 tablet by mouth 2 (two) times daily.     [provider]  nitroGLYCERIN (NITROSTAT) 0.4 MG SL tablet Place 1 tablet (0.4 mg total) under the tongue every 5 (five) minutes as needed for chest pain. 01/12/17   [name], MD  NON FORMULARY Swish and spit 5 mLs at bedtime. Doxycycline/dexamethasone/nystatin/diphenhydramine compounded mouth elixir    [provider]  pantoprazole (PROTONIX) 40 MG tablet Take 1 tablet (40 mg total) by mouth daily. 11/22/18   [name], MD  predniSONE (DELTASONE) 10 MG tablet Take 10-60 mg by mouth See admin instructions. Take 6 tablets (60 mg) by mouth for 2 days, take 5 tablets (50 mg) by mouth for 2 days, take 4 tablets (40 mg) by mouth for 2 days, take 3 tablets (30 mg) by mouth for 2 days, take 2 tablets (20 mg) by mouth for 2 days, then take 1 tablet (10 mg) by mouth for 2 days.    [provider]  rosuvastatin (CRESTOR) 20 MG tablet TAKE ONE TABLET EVERY DAY Patient taking differently: Take 20 mg by mouth every evening.  09/10/18   [name], MD  tamsulosin (FLOMAX) 0.4 MG CAPS capsule Take 1 capsule (0.4 mg total) by mouth daily. Patient taking differently: Take 0.4 mg by mouth every evening.  02/11/19   Stoioff, Ronda Fairly, MD    Allergies Clonazepam, Codeine, Niacin and related, Tramadol, Hydrocodone, and Oxycodone  Family History  Problem Relation Age of Onset     Ulcers Mother        Peptic   Dementia Mother    Alcohol abuse Father     Social History Social History   Tobacco Use   Smoking status: Former Smoker    Types: Cigarettes    Quit date: 08/04/1997    Years since quitting: 21.7   Smokeless tobacco: Never Used  Substance Use Topics   Alcohol use: No   Drug use: No    Review of Systems Constitutional: No fever/chills Eyes: No visual changes. ENT: No sore throat. Cardiovascular: Denies chest pain. Respiratory: Denies shortness of breath. Gastrointestinal: Positive for abdominal pain.  No nausea, no vomiting.  No diarrhea.  No constipation. Genitourinary: Negative for dysuria. Musculoskeletal: Negative for neck pain.  Negative for back pain. Integumentary: Negative for rash. Neurological: Negative for headaches, focal weakness or numbness.  ____________________________________________   PHYSICAL EXAM:  VITAL SIGNS: ED Triage Vitals  Enc Vitals Group     BP 04/12/19 1516 (!) 144/92     Pulse Rate 04/12/19 1516 91     Resp 04/12/19 1516 20     Temp 04/12/19 1516 98.3 F (36.8 C)     Temp Source 04/12/19 1516 Oral     SpO2 04/12/19 1516 99 %     Weight 04/12/19 1515 87.1 kg (192 lb)     Height 04/12/19 1515 1.829 m (6')     Head Circumference --      Peak Flow --      Pain Score 04/12/19 1530 5     Pain Loc --      Pain Edu? --      Excl. in [place]? --     Constitutional: Alert and oriented.  Eyes: Conjunctivae are normal.  Mouth/Throat: Mucous membranes are moist. Neck: No stridor.  No meningeal signs.   Cardiovascular: Normal rate, regular rhythm. Good peripheral circulation. Grossly normal heart sounds. Respiratory: Normal respiratory effort.  No retractions. Gastrointestinal: Soft  and nontender. No distention.  Musculoskeletal: No lower extremity tenderness nor edema. No gross deformities of extremities. Neurologic:  Normal speech and language. No gross focal neurologic deficits are appreciated.   Skin:  Skin is warm, dry and intact. Psychiatric: Mood and affect are normal. Speech and behavior are normal.  ____________________________________________   LABS (all labs ordered are listed, but only abnormal results are displayed)  Labs Reviewed  COMPREHENSIVE METABOLIC PANEL - Abnormal; Notable for the following components:      Result Value   Glucose, Bld 115 (*)    Alkaline Phosphatase 149 (*)    All other components within normal limits  CBC - Abnormal; Notable for the following components:   RBC 4.08 (*)    Hemoglobin 12.4 (*)    HCT 37.9 (*)    All other components within normal limits  URINALYSIS, COMPLETE (UACMP) WITH MICROSCOPIC - Abnormal; Notable for the following components:   Color, Urine YELLOW (*)    APPearance CLEAR (*)    Ketones, ur 5 (*)    All other components within normal limits  LIPASE, BLOOD   _________________________________________  RADIOLOGY I, Sterling N Thaxton Pelley, personally viewed and evaluated these images (plain radiographs) as part of my medical decision making, as well as reviewing the written report by the radiologist.  ED MD interpretation: Infrarenal Abdominal saccular aortic aneurysm 5 x 3.9 x 4.9 cm craniocaudal dimensions that is increased in size per the radiologist.  Diverticulosis, left renal calculi  Official radiology report(s): Ct Abdomen Pelvis W Contrast  Result Date: 04/12/2019 CLINICAL DATA:  Acute abdominal pain left lower quadrant EXAM: CT ABDOMEN AND PELVIS WITH CONTRAST TECHNIQUE: Multidetector CT imaging of the abdomen and pelvis was performed using the standard protocol following bolus administration of intravenous contrast. CONTRAST:  17m OMNIPAQUE IOHEXOL 300 MG/ML  SOLN COMPARISON:  October 01, 2012 FINDINGS: Lower chest: The visualized heart size within normal limits. No pericardial fluid/thickening. Aortic and coronary artery calcifications are seen. Mitral valve calcifications are noted. No hiatal hernia. The  visualized portions of the lungs are clear. Hepatobiliary: There is diffuse low density seen throughout the liver parenchyma. No focal hepatic lesion.The main portal vein is patent. Layering hyperdense sludge or small stones are present. No biliary ductal dilatation. Pancreas: Unremarkable. No pancreatic ductal dilatation or surrounding inflammatory changes. Spleen: Normal in size without focal abnormality. Adrenals/Urinary Tract: Both adrenal glands appear normal. Again noted are left-sided renal calculi the largest measuring 8 mm in lower pole of the left kidney. No hydronephrosis. A 1.7 cm low-density lesion is seen off the lower pole of left kidney. Stomach/Bowel: The stomach, small bowel, and colon are normal in appearance. Scattered colonic diverticula are noted without diverticulitis. No inflammatory changes, wall thickening, or obstructive findings.The appendix is normal. Vascular/Lymphatic: There are no enlarged mesenteric, retroperitoneal, or pelvic lymph nodes. Again noted is an infrarenal saccular abdominal aortic aneurysm which appears to have increased in size from the prior exam. It is now measuring 5.0 x 3.9 cm and 4.9 cm in craniocaudal dimension. There is calcified and noncalcified plaque seen throughout. The patient has had bi-iliac stent graft placement. There is limited visualization seen within the stent graft for patency given the contrast timing. Reproductive: Radiation prostate seeds are seen. Other: No evidence of abdominal wall mass or hernia. Musculoskeletal: Cement fixation is seen at within the L3 through L5 vertebral bodies as well as the T12 vertebral body. There is a slight superior compression deformity of the L1 vertebral body with less than 25%  loss in height. There is cement seen within the inter disc space at L3-L4. IMPRESSION: 1. Infrarenal abdominal saccular aortic aneurysm measuring 5.0 x 3.9 x 4.9 cm in craniocaudal dimension. This has increased in size from the prior exam.  Recommend followup by abdomen and pelvis CTA in 3-6 months, and vascular surgery referral/consultation if not already obtained. This recommendation follows ACR consensus guidelines: White Paper of the ACR Incidental Findings Committee II on Vascular Findings. J Am Coll Radiol 2013; 10:789-794. Aortic aneurysm NOS (ICD10-I71.9) 2. Non-obstructing left renal calculi. 3. Colonic diverticula without evidence of diverticulitis 4. Prior cement fixation of T12 and L1 through L3. 5. Chronic slight superior compression deformity of L1 with less than 25% loss in height. Electronically Signed   By: Prudencio Pair M.D.   On: 04/12/2019 18:30    :  Procedures   ____________________________________________   INITIAL IMPRESSION / MDM / [place] / ED COURSE  As part of my medical decision making, I reviewed the following data within the [facility] NUMBER  85 year old male presented with above-stated history and physical exam secondary to left flank pain.  Concern for possible diverticulitis ureterolithiasis less likely aortic dissection.  Laboratory data unremarkable CT scan of the abdomen pelvis revealed a increased incised infrarenal aortic aneurysm without any signs of rupture per the radiologist.  CT also revealed diverticulosis and a left renal calculi.  I spoke with the patient and his wife at length regarding the necessity of following up with Dr. Lucky Cowboy vascular surgery as well as Dr. Bernardo Heater urology.  Patient has no pain at present.      ____________________________________________  FINAL CLINICAL IMPRESSION(S) / ED DIAGNOSES  Final diagnoses:  Abdominal aortic aneurysm (AAA) without rupture (HCC)  Diverticulosis of colon  Kidney stone     MEDICATIONS GIVEN DURING THIS VISIT:  Medications  iohexol (OMNIPAQUE) 240 MG/ML injection 50 mL (50 mLs Oral Contrast Given 04/12/19 1645)  iohexol (OMNIPAQUE) 300 MG/ML solution 100 mL (100 mLs Intravenous Contrast Given 04/12/19 1757)      ED Discharge Orders    None      *Please note:  SHAYON TROMPETER was evaluated in Emergency Department on 04/12/2019 for the symptoms described in the history of present illness. He was evaluated in the context of the global COVID-19 pandemic, which necessitated consideration that the patient might be at risk for infection with the SARS-CoV-2 virus that causes COVID-19. Institutional protocols and algorithms that pertain to the evaluation of patients at risk for COVID-19 are in a state of rapid change based on information released by regulatory bodies including the CDC and federal and state organizations. These policies and algorithms were followed during the patient's care in the ED.  Some ED evaluations and interventions may be delayed as a result of limited staffing during the pandemic.*  Note:  This document was prepared using Dragon voice recognition software and may include unintentional dictation errors.   Gregor Hams, MD 04/12/19 2228

## 2019-04-13 ENCOUNTER — Telehealth: Payer: Self-pay | Admitting: Family Medicine

## 2019-04-13 NOTE — Telephone Encounter (Signed)
[name] calling back to speak with [name] from yesterday regarding his left side pain. Please call [name] back at [phone]  Thanks, [facility]

## 2019-04-15 ENCOUNTER — Ambulatory Visit (INDEPENDENT_AMBULATORY_CARE_PROVIDER_SITE_OTHER): Payer: Medicare Other | Admitting: Vascular Surgery

## 2019-04-15 ENCOUNTER — Other Ambulatory Visit: Payer: Self-pay

## 2019-04-15 ENCOUNTER — Encounter (INDEPENDENT_AMBULATORY_CARE_PROVIDER_SITE_OTHER): Payer: Self-pay | Admitting: Vascular Surgery

## 2019-04-15 VITALS — BP 151/78 | HR 78 | Resp 16 | Ht 72.0 in | Wt 192.0 lb

## 2019-04-15 DIAGNOSIS — I7409 Other arterial embolism and thrombosis of abdominal aorta: Secondary | ICD-10-CM | POA: Diagnosis not present

## 2019-04-15 DIAGNOSIS — I480 Paroxysmal atrial fibrillation: Secondary | ICD-10-CM | POA: Diagnosis not present

## 2019-04-15 DIAGNOSIS — J432 Centrilobular emphysema: Secondary | ICD-10-CM

## 2019-04-15 DIAGNOSIS — I10 Essential (primary) hypertension: Secondary | ICD-10-CM

## 2019-04-15 DIAGNOSIS — I714 Abdominal aortic aneurysm, without rupture, unspecified: Secondary | ICD-10-CM

## 2019-04-15 DIAGNOSIS — I2589 Other forms of chronic ischemic heart disease: Secondary | ICD-10-CM | POA: Diagnosis not present

## 2019-04-15 DIAGNOSIS — Z8679 Personal history of other diseases of the circulatory system: Secondary | ICD-10-CM | POA: Insufficient documentation

## 2019-04-15 DIAGNOSIS — I779 Disorder of arteries and arterioles, unspecified: Secondary | ICD-10-CM

## 2019-04-15 NOTE — Assessment & Plan Note (Signed)
blood pressure control important in reducing the progression of atherosclerotic disease. On appropriate oral medications.  

## 2019-04-15 NOTE — Assessment & Plan Note (Signed)
This will complicate the endovascular repair but with the smaller sheath sizes being used today I think we can navigate through this.  He may also get some improvement in his lower extremity perfusion after stent graft repair.  He may need treatment for occlusive disease in the external iliac artery separate from the aneurysm repair.

## 2019-04-15 NOTE — Assessment & Plan Note (Signed)
On anticoagulation and rate controlled 

## 2019-04-15 NOTE — Progress Notes (Signed)
Patient ID: [name], male   DOB: December 09, 1933, 85 y.o.   MRN: [id number]  Chief Complaint  Patient presents with  . Follow-up    5cm aneurysm    HPI [name] is a 85 y.o. male.  I am asked to see the patient by R. Owens Shark, MD in the [facility] ER for evaluation of a 5 cm aneurysm.  He came in with back and abdominal pain.  He has several reasons to have this including kidney stones and compression fractures in his spine, but there had been a significant growth in his abdominal aortic aneurysm.  His back pain is a little better today than it was although it still persists.  I have independently reviewed his scan from earlier this week.  This demonstrates a somewhat saccular appearing aneurysm in the mid to distal abdominal aorta measuring 5.0 cm.  He has iliac artery stents in place that appear to have some degree of restenosis as well as significant native iliac artery disease below the stents.  His common femoral arteries are mild to moderately calcified mostly in the posterior segments.  He does have significant activity limitation.  He does not have ischemic rest pain or ulceration of his feet.     Past Medical History:  Diagnosis Date  . Arthritis   . Atherosclerotic heart disease of native coronary artery without angina pectoris 1998   - s/p CABG, occluded SVG-D1; patent LIMA-LAD, SVG-OM, SVG- RCA  . Basal cell carcinoma of eyelid 01/03/2013   2019 - R side of Nose  . Benign neoplasm of ascending colon   . Benign neoplasm of descending colon   . Cancer [facility])    Skin cancer  . Colon polyps   . COPD (chronic obstructive pulmonary disease) ([place])   . Coronary artery disease   . Dementia ([place])   . Dysrhythmia    atrial fib  . GERD (gastroesophageal reflux disease)   . Hemorrhoid 02/10/2015  . History of hiatal hernia   . HOH (hard of hearing)   . Hypertension   . Memory loss   . Osteoporosis   . PAF (paroxysmal atrial fibrillation) ([place])   . Prostate cancer ([place])   .  Prostate disorder   . Sciatic pain    Chronic  . Sleep apnea    cpap  . Temporary cerebral vascular dysfunction 02/10/2015   Had negative work-up.  July, 2012.  No medication changes, had forgot them that day, got dehydrated.   . Urinary incontinence     Past Surgical History:  Procedure Laterality Date  . ANKLE SURGERY    . Arch aortogram and carotid aortogram  06/02/2006   Dr Oneida Alar did surgery  . BASAL CELL CARCINOMA EXCISION  04/2016   Dermatology  . CARDIAC CATHETERIZATION  01/30/1998    (Dr. Linard Millers): Native LAD & mRCA CTO. 90% D1. Mod Cx. SVG-D1 CTO. SVG-RCA & SVG-OM along with LIMA-LAD patent;  LV NORMAL Fxn  . CAROTID ENDARTERECTOMY Left Oct. 29, 2007   Dr. Oneida Alar:  . CATARACT EXTRACTION W/PHACO Right 03/05/2017   Procedure: CATARACT EXTRACTION PHACO AND INTRAOCULAR LENS PLACEMENT (IOC);  Surgeon: Leandrew Koyanagi, MD;  Location: [facility] ORS;  Service: Ophthalmology;  Laterality: Right;  [place] 00:58.5 AP% 10.6 CDE 6.20 Fluid Pack lot # [id number]  . CATARACT EXTRACTION W/PHACO Left 04/15/2017   Procedure: CATARACT EXTRACTION PHACO AND INTRAOCULAR LENS PLACEMENT ([place])  Left;  Surgeon: Leandrew Koyanagi, MD;  Location: [place];  Service: Ophthalmology;  Laterality:  Left;  . COLONOSCOPY WITH PROPOFOL N/A 09/15/2017   Procedure: COLONOSCOPY WITH PROPOFOL;  Surgeon: Lucilla Lame, MD;  Location: [facility] ENDOSCOPY;  Service: Endoscopy;  Laterality: N/A;  . COLONOSCOPY WITH PROPOFOL N/A 10/26/2018   Procedure: COLONOSCOPY WITH PROPOFOL;  Surgeon: Lucilla Lame, MD;  Location: [facility] ENDOSCOPY;  Service: Endoscopy;  Laterality: N/A;  . CORONARY ARTERY BYPASS GRAFT  1998   LIMA-LAD, SVG-OM, SVG-RPDA, SVG-DIAG  . CPET / MET  12/2012   Mild chronotropic incompetence - read 82% of predicted; also reduced effort; peak VO2 15.7 / 75% (did not reach Max effort) -- suggested ischemic response in last 1.5 minutes of exercise. Normal pulmonary function on PFTs but poor response to  . DOPPLER  ECHOCARDIOGRAPHY  11/20/2010   EF =>55%; LV norm mild aortic scelorosis  . ESOPHAGOGASTRODUODENOSCOPY (EGD) WITH PROPOFOL N/A 09/15/2017   Procedure: ESOPHAGOGASTRODUODENOSCOPY (EGD) WITH PROPOFOL;  Surgeon: Lucilla Lame, MD;  Location: [facility] ENDOSCOPY;  Service: Endoscopy;  Laterality: N/A;  . FRACTURE SURGERY Right 03/19/2015   wrist  . HIATAL HERNIA REPAIR    . ILIAC ARTERY STENT  12/15/2005   PTA and direct stenting rgt and lft common iliac arteries  . KYPHOPLASTY N/A 04/05/2019   Procedure: KYPHOPLASTY T12, L3, L4 L5;  Surgeon: Hessie Knows, MD;  Location: [facility] ORS;  Service: Orthopedics;  Laterality: N/A;  . NM GATED MYOVIEW ([place] HX)  12/2017   Low Risk - no ischemia or infarct.  EF > 65%. no RWMA  . NM MYOVIEW ([place] HX)  11/11/2010; February 2016   a) TM STRESS----NORMAL PERFUSION ,EF 68%; b) EF 50-55%. Normal LV function. No significant ischemia or infarction.  . OPEN REDUCTION INTERNAL FIXATION (ORIF) DISTAL RADIAL FRACTURE Left 01/13/2019   Procedure: OPEN REDUCTION INTERNAL FIXATION (ORIF) DISTAL  RADIAL FRACTURE - LEFT - SLEEP APNEA;  Surgeon: Hessie Knows, MD;  Location: [facility] ORS;  Service: Orthopedics;  Laterality: Left;  . ORIF ELBOW FRACTURE Right 01/01/2017   Procedure: OPEN REDUCTION INTERNAL FIXATION (ORIF) ELBOW/OLECRANON FRACTURE;  Surgeon: Hessie Knows, MD;  Location: [facility] ORS;  Service: Orthopedics;  Laterality: Right;  . ORIF WRIST FRACTURE Right 03/19/2015   Procedure: OPEN REDUCTION INTERNAL FIXATION (ORIF) WRIST FRACTURE;  Surgeon: Hessie Knows, MD;  Location: [facility] ORS;  Service: Orthopedics;  Laterality: Right;  . THORACIC AORTA - CAROTID ANGIOGRAM  October 2007   Dr. 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 TRANSTHORACIC ECHOCARDIOGRAM  February 2016   [facility]: Normal LV function. Dilated left atrium.    Family History Family History  Problem Relation Age of Onset  . Ulcers  Mother        Peptic  . Dementia Mother   . Alcohol abuse Father   No bleeding or clotting disorders No aneurysms  Social History Social History   Tobacco Use  . Smoking status: Former Smoker    Types: Cigarettes    Quit date: 08/04/1997    Years since quitting: 21.7  . Smokeless tobacco: Never Used  Substance Use Topics  . Alcohol use: No  . Drug use: No  Girlfriend accompanies him today and we discussed the situation over the phone due to COVID restrictions  Allergies  Allergen Reactions  . Clonazepam Other (See Comments)    Altered mental status  . Codeine Other (See Comments)    Altered mental status.  . Niacin And Related Other (See Comments)    Flushing of skin  . Tramadol  Other reaction(s): Hallucination  . Hydrocodone     confusion  . Oxycodone Other (See Comments)    confusion confusion    Current Outpatient Medications  Medication Sig Dispense Refill  . acetaminophen (TYLENOL) 500 MG tablet Take 500-1,000 mg by mouth every 6 (six) hours as needed (for pain.).    [name] ADVAIR DISKUS 250-50 MCG/DOSE AEPB INHALE ONE PUFF BY MOUTH TWICE DAILY AS DIRECTED (RINSE MOUTH AFTER USE) (Patient taking differently: Inhale 1 puff into the lungs 2 (two) times a day. ) 60 each 5  . Ascorbic Acid (VITAMIN C) 1000 MG tablet Take 1,000 mg by mouth daily.    . Cholecalciferol (VITAMIN D-3 PO) Take 1,000 Units by mouth every evening.     . Cyanocobalamin (VITAMIN B-12) 500 MCG SUBL Place 500 mcg under the tongue daily.     [name] donepezil (ARICEPT) 10 MG tablet Take 10 mg by mouth at bedtime.     [name] ELIQUIS 5 MG TABS tablet TAKE 1 TABLET BY MOUTH TWICE DAILY (Patient taking differently: Take 5 mg by mouth 2 (two) times daily. ) 180 tablet 1  . ezetimibe (ZETIA) 10 MG tablet TAKE 1 TABLET BY MOUTH DAILY (Patient taking differently: Take 10 mg by mouth every evening. ) 30 tablet 5  . finasteride (PROSCAR) 5 MG tablet Take 1 tablet (5 mg total) by mouth daily. 90 tablet 3  . fluticasone  (FLONASE) 50 MCG/ACT nasal spray Place 2 sprays into both nostrils daily as needed for allergies. (Patient taking differently: Place 2 sprays into both nostrils daily. ) 16 g 4  . lisinopril (PRINIVIL,ZESTRIL) 2.5 MG tablet TAKE ONE TABLET BY MOUTH TWICE DAILY (Patient taking differently: Take 2.5 mg by mouth 2 (two) times a day. ) 180 tablet 4  . Magnesium Oxide 500 MG TABS Take 500 mg by mouth every evening.    . metoprolol tartrate (LOPRESSOR) 25 MG tablet TAKE ONE TABLET BY MOUTH TWICE DAILY (Patient taking differently: Take 25 mg by mouth 2 (two) times daily. ) 180 tablet 1  . mirabegron ER (MYRBETRIQ) 50 MG TB24 tablet Take 1 tablet (50 mg total) by mouth daily. (Patient taking differently: Take 50 mg by mouth every evening. ) 30 tablet 11  . Multiple Vitamins-Minerals (PRESERVISION AREDS 2 PO) Take 1 tablet by mouth 2 (two) times daily.     . nitroGLYCERIN (NITROSTAT) 0.4 MG SL tablet Place 1 tablet (0.4 mg total) under the tongue every 5 (five) minutes as needed for chest pain. 20 tablet 1  . NON FORMULARY Swish and spit 5 mLs at bedtime. Doxycycline/dexamethasone/nystatin/diphenhydramine compounded mouth elixir    . pantoprazole (PROTONIX) 40 MG tablet Take 1 tablet (40 mg total) by mouth daily. 30 tablet 5  . rosuvastatin (CRESTOR) 20 MG tablet TAKE ONE TABLET EVERY DAY (Patient taking differently: Take 20 mg by mouth every evening. ) 90 tablet 4  . tamsulosin (FLOMAX) 0.4 MG CAPS capsule Take 1 capsule (0.4 mg total) by mouth daily. (Patient taking differently: Take 0.4 mg by mouth every evening. ) 30 capsule 3  . predniSONE (DELTASONE) 10 MG tablet Take 10-60 mg by mouth See admin instructions. Take 6 tablets (60 mg) by mouth for 2 days, take 5 tablets (50 mg) by mouth for 2 days, take 4 tablets (40 mg) by mouth for 2 days, take 3 tablets (30 mg) by mouth for 2 days, take 2 tablets (20 mg) by mouth for 2 days, then take 1 tablet (10 mg) by mouth for 2 days.  No current  facility-administered medications for this visit.       REVIEW OF SYSTEMS (Negative unless checked)  Constitutional: '[]' Weight loss  '[]' Fever  '[]' Chills Cardiac: '[]' Chest pain   '[]' Chest pressure   '[x]' Palpitations   '[]' Shortness of breath when laying flat   '[]' Shortness of breath at rest   '[x]' Shortness of breath with exertion. Vascular:  '[x]' Pain in legs with walking   '[]' Pain in legs at rest   '[]' Pain in legs when laying flat   '[]' Claudication   '[]' Pain in feet when walking  '[]' Pain in feet at rest  '[]' Pain in feet when laying flat   '[]' History of DVT   '[]' Phlebitis   '[]' Swelling in legs   '[]' Varicose veins   '[]' Non-healing ulcers Pulmonary:   '[]' Uses home oxygen   '[]' Productive cough   '[]' Hemoptysis   '[]' Wheeze  '[x]' COPD   '[]' Asthma Neurologic:  '[]' Dizziness  '[]' Blackouts   '[]' Seizures   '[]' History of stroke   '[]' History of TIA  '[]' Aphasia   '[]' Temporary blindness   '[]' Dysphagia   '[]' Weakness or numbness in arms   '[]' Weakness or numbness in legs X positive for memory loss Musculoskeletal:  '[x]' Arthritis   '[]' Joint swelling   '[]' Joint pain   '[x]' Low back pain Hematologic:  '[]' Easy bruising  '[]' Easy bleeding   '[]' Hypercoagulable state   '[]' Anemic  '[]' Hepatitis Gastrointestinal:  '[]' Blood in stool   '[]' Vomiting blood  '[]' Gastroesophageal reflux/heartburn   '[]' Abdominal pain Genitourinary:  '[]' Chronic kidney disease   '[x]' Difficult urination  '[]' Frequent urination  '[]' Burning with urination   '[]' Hematuria Skin:  '[]' Rashes   '[]' Ulcers   '[]' Wounds Psychological:  '[]' History of anxiety   '[]'  History of major depression.    Physical Exam BP (!) 151/78 (BP Location: Right Arm)   Pulse 78   Resp 16   Ht 6' (1.829 m)   Wt 192 lb (87.1 kg)   BMI 26.04 kg/m  Gen:  WD/WN, NAD Head: [place]/AT, No temporalis wasting.  Ear/Nose/Throat: Hearing diminished, nares w/o erythema or drainage, oropharynx w/o Erythema/Exudate Eyes: Conjunctiva clear, sclera non-icteric  Neck: trachea midline.  No JVD.  Pulmonary:  Good air movement, respirations not labored, no use  of accessory muscles  Cardiac: RRR, no JVD Vascular:  Vessel Right Left  Radial Palpable Palpable                          DP 1+ trace  PT NP 1+   Gastrointestinal:. No masses, surgical incisions, or scars. Musculoskeletal: M/S 5/5 throughout.  Extremities without ischemic changes.  No deformity or atrophy. Mild edema. Neurologic: Sensation grossly intact in extremities.  Symmetrical.  Speech is fluent. Motor exam as listed above. Psychiatric: Judgment intact, Mood & affect appropriate for pt's clinical situation. Dermatologic: No rashes or ulcers noted.  No cellulitis or open wounds.    Radiology Dg Thoracic Spine 2 View  Result Date: 04/05/2019 CLINICAL DATA:  Vertebroplasty. EXAM: THORACIC SPINE 2 VIEWS; DG C-ARM 61-120 MIN COMPARISON:  CT cervical spine report 01/06/2019. Portable chest x-ray 01/06/2019. FINDINGS: Vertebral bodies are difficult to count on these images. What appears to be T12, L3, L4, and L5 vertebroplasties noted. 2 minutes 30 seconds fluoroscopy time utilized. IMPRESSION: Thoracolumbar vertebroplasty. Electronically Signed   By: Marcello Moores  Register   On: 04/05/2019 14:37   Dg Lumbar Spine 2-3 Views  Result Date: 04/05/2019 CLINICAL DATA:  Intraoperative imaging for kyphoplasty. EXAM: LUMBAR SPINE - 2-3 VIEW COMPARISON:  None. FINDINGS: Two fluoroscopic spot views of the lower thoracic spine demonstrate methylmethacrylate in  the T12 vertebral body. IMPRESSION: Intraoperative imaging for kyphoplasty. Electronically Signed   By: Inge Rise M.D.   On: 04/05/2019 14:36   Ct Abdomen Pelvis W Contrast  Result Date: 04/12/2019 CLINICAL DATA:  Acute abdominal pain left lower quadrant EXAM: CT ABDOMEN AND PELVIS WITH CONTRAST TECHNIQUE: Multidetector CT imaging of the abdomen and pelvis was performed using the standard protocol following bolus administration of intravenous contrast. CONTRAST:  137m OMNIPAQUE IOHEXOL 300 MG/ML  SOLN COMPARISON:  October 01, 2012  FINDINGS: Lower chest: The visualized heart size within normal limits. No pericardial fluid/thickening. Aortic and coronary artery calcifications are seen. Mitral valve calcifications are noted. No hiatal hernia. The visualized portions of the lungs are clear. Hepatobiliary: There is diffuse low density seen throughout the liver parenchyma. No focal hepatic lesion.The main portal vein is patent. Layering hyperdense sludge or small stones are present. No biliary ductal dilatation. Pancreas: Unremarkable. No pancreatic ductal dilatation or surrounding inflammatory changes. Spleen: Normal in size without focal abnormality. Adrenals/Urinary Tract: Both adrenal glands appear normal. Again noted are left-sided renal calculi the largest measuring 8 mm in lower pole of the left kidney. No hydronephrosis. A 1.7 cm low-density lesion is seen off the lower pole of left kidney. Stomach/Bowel: The stomach, small bowel, and colon are normal in appearance. Scattered colonic diverticula are noted without diverticulitis. No inflammatory changes, wall thickening, or obstructive findings.The appendix is normal. Vascular/Lymphatic: There are no enlarged mesenteric, retroperitoneal, or pelvic lymph nodes. Again noted is an infrarenal saccular abdominal aortic aneurysm which appears to have increased in size from the prior exam. It is now measuring 5.0 x 3.9 cm and 4.9 cm in craniocaudal dimension. There is calcified and noncalcified plaque seen throughout. The patient has had bi-iliac stent graft placement. There is limited visualization seen within the stent graft for patency given the contrast timing. Reproductive: Radiation prostate seeds are seen. Other: No evidence of abdominal wall mass or hernia. Musculoskeletal: Cement fixation is seen at within the L3 through L5 vertebral bodies as well as the T12 vertebral body. There is a slight superior compression deformity of the L1 vertebral body with less than 25% loss in height. There  is cement seen within the inter disc space at L3-L4. IMPRESSION: 1. Infrarenal abdominal saccular aortic aneurysm measuring 5.0 x 3.9 x 4.9 cm in craniocaudal dimension. This has increased in size from the prior exam. Recommend followup by abdomen and pelvis CTA in 3-6 months, and vascular surgery referral/consultation if not already obtained. This recommendation follows ACR consensus guidelines: White Paper of the ACR Incidental Findings Committee II on Vascular Findings. J Am Coll Radiol 2013; 10:789-794. Aortic aneurysm NOS (ICD10-I71.9) 2. Non-obstructing left renal calculi. 3. Colonic diverticula without evidence of diverticulitis 4. Prior cement fixation of T12 and L1 through L3. 5. Chronic slight superior compression deformity of L1 with less than 25% loss in height. Electronically Signed   By: BPrudencio PairM.D.   On: 04/12/2019 18:30   Dg C-arm 1-60 Min  Result Date: 04/05/2019 CLINICAL DATA:  Vertebroplasty. EXAM: THORACIC SPINE 2 VIEWS; DG C-ARM 61-120 MIN COMPARISON:  CT cervical spine report 01/06/2019. Portable chest x-ray 01/06/2019. FINDINGS: Vertebral bodies are difficult to count on these images. What appears to be T12, L3, L4, and L5 vertebroplasties noted. 2 minutes 30 seconds fluoroscopy time utilized. IMPRESSION: Thoracolumbar vertebroplasty. Electronically Signed   By: TMarcello Moores Register   On: 04/05/2019 14:37    Labs Recent Results (from the past 2160 hour(s))  PSA  Status: None   Collection Time: 03/31/19  2:18 PM  Result Value Ref Range   Prostatic Specific Antigen 0.16 0.00 - 4.00 ng/mL    Comment: (NOTE) While PSA levels of <=4.0 ng/ml are reported as reference range, some men with levels below 4.0 ng/ml can have prostate cancer and many men with PSA above 4.0 ng/ml do not have prostate cancer.  Other tests such as free PSA, age specific reference ranges, PSA velocity and PSA doubling time may be helpful especially in men less than 76 years old. Performed at [facility], [place] [address] [place], [place] [address]   SARS CORONAVIRUS 2 Nasal Swab Aptima Multi Swab     Status: None   Collection Time: 04/01/19  3:27 PM   Specimen: Aptima Multi Swab; Nasal Swab  Result Value Ref Range   SARS Coronavirus 2 NEGATIVE NEGATIVE    Comment: (NOTE) SARS-CoV-2 target nucleic acids are NOT DETECTED. The SARS-CoV-2 RNA is generally detectable in upper and lower respiratory specimens during the acute phase of infection. Negative results do not preclude SARS-CoV-2 infection, do not rule out co-infections with other pathogens, and should not be used as the sole basis for treatment or other patient management decisions. Negative results must be combined with clinical observations, patient history, and epidemiological information. The expected result is Negative. Fact Sheet for Patients: SugarRoll.be Fact Sheet for Healthcare Providers: https://www.woods-mathews.com/ This test is not yet approved or cleared by the [place] FDA and  has been authorized for detection and/or diagnosis of SARS-CoV-2 by FDA under an Emergency Use Authorization (EUA). This EUA will remain  in effect (meaning this test can be used) for the duration of the COVID-19 declaration under Section 56 4(b)(1) of the Act, 21 U.S.C. section 360bbb-3(b)(1), unless the authorization is terminated or revoked sooner. Performed at [facility], [place] [address], [place], [place] [address]   Lipase, blood     Status: None   Collection Time: 04/04/19  8:52 PM  Result Value Ref Range   Lipase 27 11 - 51 U/L    Comment: Performed at [facility], [address] [place], [place] [address]  Comprehensive metabolic panel     Status: Abnormal   Collection Time: 04/04/19  8:52 PM  Result Value Ref Range   Sodium 143 135 - 145 mmol/L   Potassium 4.0 3.5 - 5.1 mmol/L   Chloride 108 98 - 111 mmol/L   CO2 26 22 - 32 mmol/L   Glucose, Bld 104  (H) 70 - 99 mg/dL   BUN 22 8 - 23 mg/dL   Creatinine, Ser 1.00 0.61 - 1.24 mg/dL   Calcium 8.9 8.9 - 10.3 mg/dL   Total Protein 6.2 (L) 6.5 - 8.1 g/dL   Albumin 3.5 3.5 - 5.0 g/dL   AST 17 15 - 41 U/L   ALT 25 0 - 44 U/L   Alkaline Phosphatase 145 (H) 38 - 126 U/L   Total Bilirubin 1.1 0.3 - 1.2 mg/dL   GFR calc non Af Amer >60 >60 mL/min   GFR calc Af Amer >60 >60 mL/min   Anion gap 9 5 - 15    Comment: Performed at [facility], [address] [place], [place] [address]  CBC     Status: Abnormal   Collection Time: 04/04/19  8:52 PM  Result Value Ref Range   WBC 13.7 (H) 4.0 - 10.5 K/uL   RBC 4.36 4.22 - 5.81 MIL/uL   Hemoglobin  13.3 13.0 - 17.0 g/dL   HCT 40.7 39.0 - 52.0 %   MCV 93.3 80.0 - 100.0 fL   MCH 30.5 26.0 - 34.0 pg   MCHC 32.7 30.0 - 36.0 g/dL   RDW 15.3 11.5 - 15.5 %   Platelets 161 150 - 400 K/uL   nRBC 0.0 0.0 - 0.2 %    Comment: Performed at [facility], [address] [place], [place] [address]  Urinalysis, Complete w Microscopic     Status: Abnormal   Collection Time: 04/04/19  8:52 PM  Result Value Ref Range   Color, Urine YELLOW (A) YELLOW   APPearance CLEAR (A) CLEAR   Specific Gravity, Urine 1.025 1.005 - 1.030   pH 6.0 5.0 - 8.0   Glucose, UA NEGATIVE NEGATIVE mg/dL   Hgb urine dipstick NEGATIVE NEGATIVE   Bilirubin Urine NEGATIVE NEGATIVE   Ketones, ur NEGATIVE NEGATIVE mg/dL   Protein, ur NEGATIVE NEGATIVE mg/dL   Nitrite NEGATIVE NEGATIVE   Leukocytes,Ua NEGATIVE NEGATIVE   WBC, UA 0-5 0 - 5 WBC/hpf   Bacteria, UA NONE SEEN NONE SEEN   Squamous Epithelial / LPF NONE SEEN 0 - 5   Mucus PRESENT    Hyaline Casts, UA PRESENT     Comment: Performed at [facility], [address], [place], [place] [address]  Surgical pathology     Status: None   Collection Time: 04/05/19  1:10 PM  Result Value Ref Range   SURGICAL PATHOLOGY      Surgical Pathology CASE: [id number] PATIENT: Elon Alas Surgical  Pathology Report     SPECIMEN SUBMITTED: A. L3 bone; bx B. L4 bone; bx C. L5 bone; bx  CLINICAL HISTORY: None provided  PRE-OPERATIVE DIAGNOSIS: Closed wedge compression fracture  POST-OPERATIVE DIAGNOSIS: Same as pre-op     DIAGNOSIS:  A.  BONE, L3 VERTEBRA; BIOPSY: - BENIGN BONE WITH FINDINGS CONSISTENT WITH FRACTURE. - NEGATIVE FOR MALIGNANCY.  B.  BONE, L4 VERTEBRA; BIOPSY: - BENIGN BONE WITH FINDINGS CONSISTENT WITH FRACTURE. - NEGATIVE FOR MALIGNANCY.  C.  BONE, L5 VERTEBRA; BIOPSY: - BENIGN BONE WITH FINDINGS CONSISTENT WITH FRACTURE. - NEGATIVE FOR MALIGNANCY.   GROSS DESCRIPTION: A. Labeled: L3 bone biopsy Received: In formalin Tissue fragment(s): 1 Size: 1.1 cm in length and 0.2 cm in diameter Description: Bone tissue core Entirely submitted in 1 cassette after decalcification.  B. Labeled: L4 bone biopsy Received: In formalin Tissue fragment(s): 1 Size:  0.4 x 0.2 x 0.2 cm Description: Hemorrhagic bone fragment Entirely submitted in 1 cassette after decalcification.  C. Labeled: L5 bone biopsy Received: In formalin Tissue fragment(s): 1 Size: 1.1 cm in length and 0.2 cm in diameter Description: Bone tissue core Entirely submitted in 1 cassette after decalcification.   Final Diagnosis performed by Betsy Pries, MD.   Electronically signed 04/07/2019 11:07:43AM The electronic signature indicates that the named Attending Pathologist has evaluated the specimen  Technical component performed at [facility], [address], [place], [place] [address] Lab: [phone] Dir: Rush Farmer, MD, MMM  Professional component performed at [facility], [facility], [place], [place], [place] [address] Lab: [phone] Dir: Dellia Nims. Rubinas, MD   Lipase, blood     Status: None   Collection Time: 04/12/19  3:17 PM  Result Value Ref Range   Lipase 32 11 - 51 U/L    Comment: Performed at [facility], [address]  [place], [place] [address]  Comprehensive metabolic panel     Status: Abnormal  Collection Time: 04/12/19  3:17 PM  Result Value Ref Range   Sodium 140 135 - 145 mmol/L   Potassium 3.6 3.5 - 5.1 mmol/L   Chloride 109 98 - 111 mmol/L   CO2 23 22 - 32 mmol/L   Glucose, Bld 115 (H) 70 - 99 mg/dL   BUN 12 8 - 23 mg/dL   Creatinine, Ser 0.83 0.61 - 1.24 mg/dL   Calcium 8.9 8.9 - 10.3 mg/dL   Total Protein 6.6 6.5 - 8.1 g/dL   Albumin 3.7 3.5 - 5.0 g/dL   AST 19 15 - 41 U/L   ALT 18 0 - 44 U/L   Alkaline Phosphatase 149 (H) 38 - 126 U/L   Total Bilirubin 0.7 0.3 - 1.2 mg/dL   GFR calc non Af Amer >60 >60 mL/min   GFR calc Af Amer >60 >60 mL/min   Anion gap 8 5 - 15    Comment: Performed at [facility], [address] [place], [place] [address]  CBC     Status: Abnormal   Collection Time: 04/12/19  3:17 PM  Result Value Ref Range   WBC 8.8 4.0 - 10.5 K/uL   RBC 4.08 (L) 4.22 - 5.81 MIL/uL   Hemoglobin 12.4 (L) 13.0 - 17.0 g/dL   HCT 37.9 (L) 39.0 - 52.0 %   MCV 92.9 80.0 - 100.0 fL   MCH 30.4 26.0 - 34.0 pg   MCHC 32.7 30.0 - 36.0 g/dL   RDW 15.1 11.5 - 15.5 %   Platelets 193 150 - 400 K/uL   nRBC 0.0 0.0 - 0.2 %    Comment: Performed at [facility], [address] [place], [place] [address]  Urinalysis, Complete w Microscopic     Status: Abnormal   Collection Time: 04/12/19  3:17 PM  Result Value Ref Range   Color, Urine YELLOW (A) YELLOW   APPearance CLEAR (A) CLEAR   Specific Gravity, Urine 1.018 1.005 - 1.030   pH 6.0 5.0 - 8.0   Glucose, UA NEGATIVE NEGATIVE mg/dL   Hgb urine dipstick NEGATIVE NEGATIVE   Bilirubin Urine NEGATIVE NEGATIVE   Ketones, ur 5 (A) NEGATIVE mg/dL   Protein, ur NEGATIVE NEGATIVE mg/dL   Nitrite NEGATIVE NEGATIVE   Leukocytes,Ua NEGATIVE NEGATIVE   RBC / HPF 0-5 0 - 5 RBC/hpf   WBC, UA 0-5 0 - 5 WBC/hpf   Bacteria, UA NONE SEEN NONE SEEN   Squamous Epithelial / LPF 0-5 0 - 5   Mucus PRESENT     Comment: Performed at  [facility], [address] [place], [place] [address]    Assessment/Plan:  Essential hypertension blood pressure control important in reducing the progression of atherosclerotic disease. On appropriate oral medications.   Aorto-iliac disease This will complicate the endovascular repair but with the smaller sheath sizes being used today I think we can navigate through this.  He may also get some improvement in his lower extremity perfusion after stent graft repair.  He may need treatment for occlusive disease in the external iliac artery separate from the aneurysm repair.  PAF (paroxysmal atrial fibrillation) (HCC) CHA2DS2-VASc = 4. AC = Eliquis On anticoagulation and rate controlled  COPD (chronic obstructive pulmonary disease) Preoperative assessment prior to surgery may be necessary based off of his preop visits.  AAA (abdominal aortic aneurysm) without rupture ([place]) I have independently reviewed his scan from earlier this week.  This demonstrates a somewhat saccular appearing aneurysm in the mid to distal  abdominal aorta measuring 5.0 cm.  He has iliac artery stents in place that appear to have some degree of restenosis as well as significant native iliac artery disease below the stents.  His common femoral arteries are mild to moderately calcified mostly in the posterior segments.  Recommend: The aneurysm is > 5 cm and therefore should undergo repair. Patient is status post CT scan of the abdominal aorta. The patient is a candidate for endovascular repair although concomitant treatment of iliac artery occlusive disease will likely need to be performed as well to allow access.   He will require cardiac clearance.   The patient will continue antiplatelet therapy as prescribed (since the patient is undergoing endovascular repair as opposed to open repair) as well as aggressive management of hyperlipidemia. Exercise is again strongly encouraged.   The patient is reminded that  lifetime routine surveillance is a necessity with an endograft.   The risks and benefits of AAA repair are reviewed with the patient.  All questions are answered.  Alternative therapies are also discussed.  The patient agrees to proceed with endovascular aneurysm repair.  Patient will follow-up with me in the office after the surgery.      Leotis Pain 04/15/2019, 9:12 AM   This note was created with Dragon medical transcription system.  Any errors from dictation are unintentional.

## 2019-04-15 NOTE — Assessment & Plan Note (Signed)
Preoperative assessment prior to surgery may be necessary based off of his preop visits.

## 2019-04-15 NOTE — Assessment & Plan Note (Signed)
I have independently reviewed his scan from earlier this week.  This demonstrates a somewhat saccular appearing aneurysm in the mid to distal abdominal aorta measuring 5.0 cm.  He has iliac artery stents in place that appear to have some degree of restenosis as well as significant native iliac artery disease below the stents.  His common femoral arteries are mild to moderately calcified mostly in the posterior segments.  Recommend: The aneurysm is > 5 cm and therefore should undergo repair. Patient is status post CT scan of the abdominal aorta. The patient is a candidate for endovascular repair although concomitant treatment of iliac artery occlusive disease will likely need to be performed as well to allow access.   He will require cardiac clearance.   The patient will continue antiplatelet therapy as prescribed (since the patient is undergoing endovascular repair as opposed to open repair) as well as aggressive management of hyperlipidemia. Exercise is again strongly encouraged.   The patient is reminded that lifetime routine surveillance is a necessity with an endograft.   The risks and benefits of AAA repair are reviewed with the patient.  All questions are answered.  Alternative therapies are also discussed.  The patient agrees to proceed with endovascular aneurysm repair.  Patient will follow-up with me in the office after the surgery.

## 2019-04-15 NOTE — Patient Instructions (Signed)
Abdominal Aortic Aneurysm Endograft Repair  Abdominal aortic aneurysm endograft repair is a surgery to fix an aortic aneurysm in the abdominal area. An aneurysm is a weak or damaged part of an artery wall that bulges out from the normal force of blood pumping through the body. An abdominal aortic aneurysm is an aneurysm that happens in the lower part of the aorta, which is the main artery of the body. The repair is often done if the aneurysm gets so large that it might burst (rupture). A ruptured aneurysm would cause bleeding inside the body that could put a person's life in danger. Before that happens, this procedure is needed to fix the problem. The procedure may also be done if the aneurysm causes symptoms such as pain in the back, abdomen, or side. In this procedure, a tube made of fabric and metal mesh (endograft or stent-graft) is placed in the weak part of the aorta to repair it. Tell a health care provider about:  Any allergies you have.  All medicines you are taking, including vitamins, herbs, eye drops, creams, and over-the-counter medicines.  Any problems you or family members have had with anesthetic medicines.  Any blood disorders you have.  Any surgeries you have had.  Any medical conditions you have.  Whether you are pregnant or may be pregnant. What are the risks? Generally, this is a safe procedure. However, problems may occur, including:  Infection of the graft or incision area.  Bleeding during the procedure or from the incision site.  Allergic reactions to medicines.  Damage to other structures or organs.  Blood leaking out around the endograft.  The endograft moving from where it was placed during surgery.  Blood flow through the graft becoming blocked.  Blood clots.  Kidney problems.  Blood flow to the legs becoming blocked (rare).  Rupture of the aorta even after the endograft repair is a success (rare). What happens before the procedure? Staying  hydrated Follow instructions from your health care provider about hydration, which may include:  Up to 2 hours before the procedure - you may continue to drink clear liquids, such as water, clear fruit juice, black coffee, and plain tea. Eating and drinking restrictions Follow instructions from your health care provider about eating and drinking, which may include:  8 hours before the procedure - stop eating heavy meals or foods such as meat, fried foods, or fatty foods.  6 hours before the procedure - stop eating light meals or foods, such as toast or cereal.  6 hours before the procedure - stop drinking milk or drinks that contain milk.  2 hours before the procedure - stop drinking clear liquids. Medicines  Ask your health care provider about: ? Changing or stopping your regular medicines. This is especially important if you are taking diabetes medicines or blood thinners. ? Taking medicines such as aspirin and ibuprofen. These medicines can thin your blood. Do not take these medicines before your procedure if your health care provider instructs you not to.  You may be given antibiotic medicine to help prevent infection. General instructions  You may need to have blood tests, a test to check heart rhythm (electrocardiogram, or ECG), or a test to check blood flow (angiogram) before the surgery.  Imaging tests will be done to check the size and location of the aneurysm. These tests could include an ultrasound, a CT scan, or an MRI.  Do not use any products that contain nicotine or tobacco-such as cigarettes and e-cigarettes-for as  long as possible before the surgery. If you need help quitting, ask your health care provider.  Ask your health care provider how your surgical site will be marked or identified.  Plan to have someone take you home from the hospital or clinic. What happens during the procedure?  To reduce your risk of infection: ? Your health care team will wash or  sanitize their hands. ? Your skin will be washed with soap. ? Hair may be removed from the surgical area.  An IV tube will be inserted into one of your veins.  You will be given one or more of the following: ? A medicine to help you relax (sedative). ? A medicine to numb the area (local anesthetic). ? A medicine to make you fall asleep (general anesthetic). ? A medicine that is injected into an area of your body to numb everything below the injection site (regional anesthetic).  During the surgery: ? Small incisions or a puncture will be made on one or both sides of the groin. Long, thin tubes (catheters) will be passed through the opening, put into the artery in your thigh, and moved up into the aneurysm in the aorta. ? The health care provider will use live X-ray pictures to guide the endograft through the catheterto the place where the aneurysm is. ? The endograft will be released to seal off the aneurysm and to line the aorta. It will keep blood from flowing into the aneurysm and will help keep it from rupturing. The endograft will stay in place and will not be taken out. ? X-rays will be used to check where the endograft is placed and to make sure that it is where it should be. ? The catheter will be taken out, and the incision will be closed with stitches (sutures). The procedure may vary among health care providers and hospitals. What happens after the procedure?  Your blood pressure, heart rate, breathing rate, and blood oxygen level will be monitored until the medicines you were given have worn off.  You will need to lie flat for a number of hours. Bending your legs can cause them to bleed and swell.  You will then be urged to get up and move around a number of times each day and to slowly become more active.  You will be given medicines to control pain.  Certain tests may be done after your procedure to check how well the endograft is working and to check its placement.  Do  not drive for 24 hours if you received a sedative. This information is not intended to replace advice given to you by your health care provider. Make sure you discuss any questions you have with your health care provider. Document Released: 01/04/2009 Document Revised: 07/31/2017 Document Reviewed: 11/12/2015 Elsevier Patient Education  2020 [employer].

## 2019-04-19 ENCOUNTER — Other Ambulatory Visit: Payer: Self-pay | Admitting: Family Medicine

## 2019-04-19 DIAGNOSIS — E785 Hyperlipidemia, unspecified: Secondary | ICD-10-CM

## 2019-04-20 DIAGNOSIS — Z01818 Encounter for other preprocedural examination: Secondary | ICD-10-CM | POA: Diagnosis not present

## 2019-04-20 DIAGNOSIS — G3184 Mild cognitive impairment, so stated: Secondary | ICD-10-CM | POA: Diagnosis not present

## 2019-04-20 DIAGNOSIS — I208 Other forms of angina pectoris: Secondary | ICD-10-CM | POA: Diagnosis not present

## 2019-04-20 DIAGNOSIS — I25119 Atherosclerotic heart disease of native coronary artery with unspecified angina pectoris: Secondary | ICD-10-CM | POA: Diagnosis not present

## 2019-04-20 DIAGNOSIS — R0602 Shortness of breath: Secondary | ICD-10-CM | POA: Diagnosis not present

## 2019-04-20 DIAGNOSIS — I480 Paroxysmal atrial fibrillation: Secondary | ICD-10-CM | POA: Diagnosis not present

## 2019-04-20 DIAGNOSIS — Z01810 Encounter for preprocedural cardiovascular examination: Secondary | ICD-10-CM | POA: Diagnosis not present

## 2019-04-20 DIAGNOSIS — F0390 Unspecified dementia without behavioral disturbance: Secondary | ICD-10-CM | POA: Diagnosis not present

## 2019-04-20 DIAGNOSIS — J449 Chronic obstructive pulmonary disease, unspecified: Secondary | ICD-10-CM | POA: Diagnosis not present

## 2019-04-20 DIAGNOSIS — S22000D Wedge compression fracture of unspecified thoracic vertebra, subsequent encounter for fracture with routine healing: Secondary | ICD-10-CM | POA: Diagnosis not present

## 2019-04-20 DIAGNOSIS — I25721 Atherosclerosis of autologous artery coronary artery bypass graft(s) with angina pectoris with documented spasm: Secondary | ICD-10-CM | POA: Diagnosis not present

## 2019-04-20 DIAGNOSIS — R410 Disorientation, unspecified: Secondary | ICD-10-CM | POA: Diagnosis not present

## 2019-04-20 DIAGNOSIS — G4733 Obstructive sleep apnea (adult) (pediatric): Secondary | ICD-10-CM | POA: Diagnosis not present

## 2019-04-21 ENCOUNTER — Ambulatory Visit: Payer: Medicare Other

## 2019-04-29 DIAGNOSIS — R0602 Shortness of breath: Secondary | ICD-10-CM | POA: Diagnosis not present

## 2019-04-29 DIAGNOSIS — I25119 Atherosclerotic heart disease of native coronary artery with unspecified angina pectoris: Secondary | ICD-10-CM | POA: Diagnosis not present

## 2019-04-29 DIAGNOSIS — I208 Other forms of angina pectoris: Secondary | ICD-10-CM | POA: Diagnosis not present

## 2019-05-02 DIAGNOSIS — G4733 Obstructive sleep apnea (adult) (pediatric): Secondary | ICD-10-CM | POA: Diagnosis not present

## 2019-05-02 DIAGNOSIS — I25721 Atherosclerosis of autologous artery coronary artery bypass graft(s) with angina pectoris with documented spasm: Secondary | ICD-10-CM | POA: Diagnosis not present

## 2019-05-02 DIAGNOSIS — J449 Chronic obstructive pulmonary disease, unspecified: Secondary | ICD-10-CM | POA: Diagnosis not present

## 2019-05-02 DIAGNOSIS — R0602 Shortness of breath: Secondary | ICD-10-CM | POA: Diagnosis not present

## 2019-05-02 DIAGNOSIS — I25119 Atherosclerotic heart disease of native coronary artery with unspecified angina pectoris: Secondary | ICD-10-CM | POA: Diagnosis not present

## 2019-05-02 DIAGNOSIS — Z9989 Dependence on other enabling machines and devices: Secondary | ICD-10-CM | POA: Diagnosis not present

## 2019-05-02 DIAGNOSIS — G3184 Mild cognitive impairment, so stated: Secondary | ICD-10-CM | POA: Diagnosis not present

## 2019-05-02 DIAGNOSIS — I480 Paroxysmal atrial fibrillation: Secondary | ICD-10-CM | POA: Diagnosis not present

## 2019-05-02 DIAGNOSIS — Z01818 Encounter for other preprocedural examination: Secondary | ICD-10-CM | POA: Diagnosis not present

## 2019-05-03 ENCOUNTER — Telehealth (INDEPENDENT_AMBULATORY_CARE_PROVIDER_SITE_OTHER): Payer: Self-pay

## 2019-05-03 DIAGNOSIS — I714 Abdominal aortic aneurysm, without rupture, unspecified: Secondary | ICD-10-CM

## 2019-05-03 HISTORY — DX: Abdominal aortic aneurysm, without rupture, unspecified: I71.40

## 2019-05-03 HISTORY — DX: Abdominal aortic aneurysm, without rupture: I71.4

## 2019-05-03 NOTE — Telephone Encounter (Signed)
Spoke with the patient's significant other [name] and the patient is scheduled for surgery on 05/18/2019 with Dr. Lucky Cowboy and Dr. Delana Meyer. Patient will do his pre-op on 05/12/2019 @ 10:00 am and his Covid testing on 05/13/2019 @ 1:25 pm at the [facility]. All paperwork will be mailed to the patient's home.

## 2019-05-06 ENCOUNTER — Other Ambulatory Visit: Payer: Self-pay | Admitting: Cardiology

## 2019-05-11 ENCOUNTER — Other Ambulatory Visit (INDEPENDENT_AMBULATORY_CARE_PROVIDER_SITE_OTHER): Payer: Self-pay | Admitting: Nurse Practitioner

## 2019-05-12 ENCOUNTER — Other Ambulatory Visit: Payer: Self-pay

## 2019-05-12 ENCOUNTER — Encounter
Admission: RE | Admit: 2019-05-12 | Discharge: 2019-05-12 | Disposition: A | Discharge status: Home / Self Care | Payer: Medicare Other | Source: Ambulatory Visit | Attending: Vascular Surgery | Admitting: Vascular Surgery

## 2019-05-12 DIAGNOSIS — Z01810 Encounter for preprocedural cardiovascular examination: Secondary | ICD-10-CM | POA: Diagnosis not present

## 2019-05-12 DIAGNOSIS — Z01818 Encounter for other preprocedural examination: Secondary | ICD-10-CM | POA: Diagnosis not present

## 2019-05-12 DIAGNOSIS — I4510 Unspecified right bundle-branch block: Secondary | ICD-10-CM | POA: Insufficient documentation

## 2019-05-12 DIAGNOSIS — I517 Cardiomegaly: Secondary | ICD-10-CM | POA: Diagnosis not present

## 2019-05-12 DIAGNOSIS — Z01812 Encounter for preprocedural laboratory examination: Secondary | ICD-10-CM | POA: Insufficient documentation

## 2019-05-12 DIAGNOSIS — Z20828 Contact with and (suspected) exposure to other viral communicable diseases: Secondary | ICD-10-CM | POA: Insufficient documentation

## 2019-05-12 HISTORY — DX: Repeated falls: R29.6

## 2019-05-12 HISTORY — DX: Personal history of urinary calculi: Z87.442

## 2019-05-12 LAB — CBC WITH DIFFERENTIAL/PLATELET
Abs Immature Granulocytes: 0.04 10*3/uL (ref 0.00–0.07)
Basophils Absolute: 0.1 10*3/uL (ref 0.0–0.1)
Basophils Relative: 1 %
Eosinophils Absolute: 0.2 10*3/uL (ref 0.0–0.5)
Eosinophils Relative: 2 %
HCT: 39.1 % (ref 39.0–52.0)
Hemoglobin: 12.6 g/dL — ABNORMAL LOW (ref 13.0–17.0)
Immature Granulocytes: 1 %
Lymphocytes Relative: 23 %
Lymphs Abs: 1.9 10*3/uL (ref 0.7–4.0)
MCH: 30.9 pg (ref 26.0–34.0)
MCHC: 32.2 g/dL (ref 30.0–36.0)
MCV: 95.8 fL (ref 80.0–100.0)
Monocytes Absolute: 0.8 10*3/uL (ref 0.1–1.0)
Monocytes Relative: 10 %
Neutro Abs: 5.2 10*3/uL (ref 1.7–7.7)
Neutrophils Relative %: 63 %
Platelets: 169 10*3/uL (ref 150–400)
RBC: 4.08 MIL/uL — ABNORMAL LOW (ref 4.22–5.81)
RDW: 15.5 % (ref 11.5–15.5)
WBC: 8.2 10*3/uL (ref 4.0–10.5)
nRBC: 0 % (ref 0.0–0.2)

## 2019-05-12 LAB — PROTIME-INR
INR: 1.3 — ABNORMAL HIGH (ref 0.8–1.2)
Prothrombin Time: 16.3 seconds — ABNORMAL HIGH (ref 11.4–15.2)

## 2019-05-12 LAB — BASIC METABOLIC PANEL
Anion gap: 9 (ref 5–15)
BUN: 14 mg/dL (ref 8–23)
CO2: 24 mmol/L (ref 22–32)
Calcium: 9.3 mg/dL (ref 8.9–10.3)
Chloride: 109 mmol/L (ref 98–111)
Creatinine, Ser: 0.77 mg/dL (ref 0.61–1.24)
GFR calc Af Amer: >60 mL/min (ref >60)
GFR calc non Af Amer: >60 mL/min (ref >60)
Glucose, Bld: 96 mg/dL (ref 70–99)
Potassium: 3.6 mmol/L (ref 3.5–5.1)
Sodium: 142 mmol/L (ref 135–145)

## 2019-05-12 LAB — APTT: aPTT: 38 seconds — ABNORMAL HIGH (ref 24–36)

## 2019-05-12 LAB — TYPE AND SCREEN
ABO/RH(D): O POS
Antibody Screen: NEGATIVE

## 2019-05-12 NOTE — Patient Instructions (Addendum)
Your procedure is scheduled on: 05/18/2019 Wed Report to Same Day Surgery 2nd floor medical mall [facility] Entrance-take elevator on left to 2nd floor.  Check in with surgery information desk.) To find out your arrival time please call [phone] between 1PM - 3PM on 05/17/2019 Tues  Remember: Instructions that are not followed completely may result in serious medical risk, up to and including death, or upon the discretion of your surgeon and anesthesiologist your surgery may need to be rescheduled.    _x___ 1. Do not eat food after midnight the night before your procedure. You may drink clear liquids up to 2 hours before you are scheduled to arrive at the hospital for your procedure.  Do not drink clear liquids within 2 hours of your scheduled arrival to the hospital.  Clear liquids include  --Water or Apple juice without pulp  --Clear carbohydrate beverage such as ClearFast or Gatorade  --Black Coffee or Clear Tea (No milk, no creamers, do not add anything to                  the coffee or Tea Type 1 and type 2 diabetics should only drink water.   ____Ensure clear carbohydrate drink on the way to the hospital for bariatric patients  ____Ensure clear carbohydrate drink 3 hours before surgery.   No gum chewing or hard candies.     __x__ 2. No Alcohol for 24 hours before or after surgery.   __x__3. No Smoking or e-cigarettes for 24 prior to surgery.  Do not use any chewable tobacco products for at least 6 hour prior to surgery   ____  4. Bring all medications with you on the day of surgery if instructed.    __x__ 5. Notify your doctor if there is any change in your medical condition     (cold, fever, infections).    x___6. On the morning of surgery brush your teeth with toothpaste and water.  You may rinse your mouth with mouth wash if you wish.  Do not swallow any toothpaste or mouthwash.   Do not wear jewelry, make-up, hairpins, clips or nail polish.  Do not wear lotions,  powders, or perfumes. You may wear deodorant.  Do not shave 48 hours prior to surgery. Men may shave face and neck.  Do not bring valuables to the hospital.    [facility] is not responsible for any belongings or valuables.               Contacts, dentures or bridgework may not be worn into surgery.  Leave your suitcase in the car. After surgery it may be brought to your room.  For patients admitted to the hospital, discharge time is determined by your                       treatment team.  _  Patients discharged the day of surgery will not be allowed to drive home.  You will need someone to drive you home and stay with you the night of your procedure.    Please read over the following fact sheets that you were given:   [facility] Preparing for Surgery and or MRSA Information   _x___ Take anti-hypertensive listed below, cardiac, seizure, asthma,     anti-reflux and psychiatric medicines. These include:  1. ADVAIR DISKUS 250-50 MCG/DOSE AEPB  2.finasteride (PROSCAR) 5 MG tablet  3.metoprolol tartrate (LOPRESSOR) 25 MG   4.pantoprazole (PROTONIX) 40 MG tablet 5.  6.  ____Fleets enema or Magnesium Citrate as directed.   _x___ Use CHG Soap or sage wipes as directed on instruction sheet   ____ Use inhalers on the day of surgery and bring to hospital day of surgery  ____ Stop Metformin and Janumet 2 days prior to surgery.    ____ Take 1/2 of usual insulin dose the night before surgery and none on the morning     surgery.   _x___ Follow recommendations from Cardiologist, Pulmonologist or PCP regarding          stopping Aspirin, Coumadin, Plavix ,Eliquis, Effient, or Pradaxa, and Pletal.  X____Stop Anti-inflammatories such as Advil, Aleve, Ibuprofen, Motrin, Naproxen, Naprosyn, Goodies powders or aspirin products. OK to take Tylenol and                          Celebrex.   _x___ Stop supplements until after surgery.  But may continue Vitamin D, Vitamin B,       and  multivitamin.   _x___ Bring C-Pap to the hospital.

## 2019-05-13 ENCOUNTER — Other Ambulatory Visit
Admission: RE | Admit: 2019-05-13 | Discharge: 2019-05-13 | Disposition: A | Discharge status: Home / Self Care | Payer: Medicare Other | Source: Ambulatory Visit | Attending: Vascular Surgery | Admitting: Vascular Surgery

## 2019-05-13 ENCOUNTER — Other Ambulatory Visit: Payer: Self-pay

## 2019-05-13 ENCOUNTER — Ambulatory Visit: Payer: Medicare Other | Admitting: Urology

## 2019-05-13 DIAGNOSIS — Z20828 Contact with and (suspected) exposure to other viral communicable diseases: Secondary | ICD-10-CM | POA: Diagnosis not present

## 2019-05-13 DIAGNOSIS — I4510 Unspecified right bundle-branch block: Secondary | ICD-10-CM | POA: Diagnosis not present

## 2019-05-13 DIAGNOSIS — I517 Cardiomegaly: Secondary | ICD-10-CM | POA: Diagnosis not present

## 2019-05-13 DIAGNOSIS — Z01812 Encounter for preprocedural laboratory examination: Secondary | ICD-10-CM | POA: Diagnosis not present

## 2019-05-13 DIAGNOSIS — Z01810 Encounter for preprocedural cardiovascular examination: Secondary | ICD-10-CM | POA: Diagnosis not present

## 2019-05-14 LAB — SARS CORONAVIRUS 2 (TAT 6-24 HRS): SARS Coronavirus 2: NEGATIVE

## 2019-05-18 ENCOUNTER — Ambulatory Visit: Admission: RE | Admit: 2019-05-18 | Payer: Medicare Other | Source: Home / Self Care | Admitting: Vascular Surgery

## 2019-05-18 ENCOUNTER — Encounter
Admission: RE | Disposition: A | Discharge status: Home Health | Payer: Self-pay | Source: Home / Self Care | Attending: Vascular Surgery

## 2019-05-18 ENCOUNTER — Inpatient Hospital Stay: Payer: Medicare Other | Admitting: Anesthesiology

## 2019-05-18 ENCOUNTER — Inpatient Hospital Stay: Payer: Medicare Other | Admitting: Certified Registered Nurse Anesthetist

## 2019-05-18 ENCOUNTER — Encounter: Payer: Self-pay | Admitting: Anesthesiology

## 2019-05-18 ENCOUNTER — Inpatient Hospital Stay
Admission: RE | Admit: 2019-05-18 | Discharge: 2019-05-19 | DRG: 269 | Disposition: A | Discharge status: Home Health | Payer: Medicare Other | Attending: Vascular Surgery | Admitting: Vascular Surgery

## 2019-05-18 ENCOUNTER — Other Ambulatory Visit: Payer: Self-pay

## 2019-05-18 DIAGNOSIS — I2510 Atherosclerotic heart disease of native coronary artery without angina pectoris: Secondary | ICD-10-CM | POA: Diagnosis present

## 2019-05-18 DIAGNOSIS — R32 Unspecified urinary incontinence: Secondary | ICD-10-CM | POA: Diagnosis present

## 2019-05-18 DIAGNOSIS — I70212 Atherosclerosis of native arteries of extremities with intermittent claudication, left leg: Secondary | ICD-10-CM | POA: Diagnosis present

## 2019-05-18 DIAGNOSIS — Z85828 Personal history of other malignant neoplasm of skin: Secondary | ICD-10-CM | POA: Diagnosis not present

## 2019-05-18 DIAGNOSIS — Z8546 Personal history of malignant neoplasm of prostate: Secondary | ICD-10-CM

## 2019-05-18 DIAGNOSIS — Z888 Allergy status to other drugs, medicaments and biological substances status: Secondary | ICD-10-CM

## 2019-05-18 DIAGNOSIS — I4510 Unspecified right bundle-branch block: Secondary | ICD-10-CM | POA: Diagnosis present

## 2019-05-18 DIAGNOSIS — Z8719 Personal history of other diseases of the digestive system: Secondary | ICD-10-CM | POA: Diagnosis not present

## 2019-05-18 DIAGNOSIS — I25810 Atherosclerosis of coronary artery bypass graft(s) without angina pectoris: Secondary | ICD-10-CM | POA: Diagnosis present

## 2019-05-18 DIAGNOSIS — I480 Paroxysmal atrial fibrillation: Secondary | ICD-10-CM | POA: Diagnosis present

## 2019-05-18 DIAGNOSIS — I10 Essential (primary) hypertension: Secondary | ICD-10-CM | POA: Diagnosis not present

## 2019-05-18 DIAGNOSIS — I714 Abdominal aortic aneurysm, without rupture: Principal | ICD-10-CM | POA: Diagnosis present

## 2019-05-18 DIAGNOSIS — G4730 Sleep apnea, unspecified: Secondary | ICD-10-CM | POA: Diagnosis present

## 2019-05-18 DIAGNOSIS — F0390 Unspecified dementia without behavioral disturbance: Secondary | ICD-10-CM | POA: Diagnosis present

## 2019-05-18 DIAGNOSIS — M5430 Sciatica, unspecified side: Secondary | ICD-10-CM | POA: Diagnosis present

## 2019-05-18 DIAGNOSIS — Z87891 Personal history of nicotine dependence: Secondary | ICD-10-CM | POA: Diagnosis not present

## 2019-05-18 DIAGNOSIS — Z86010 Personal history of colonic polyps: Secondary | ICD-10-CM | POA: Diagnosis not present

## 2019-05-18 DIAGNOSIS — Z885 Allergy status to narcotic agent status: Secondary | ICD-10-CM | POA: Diagnosis not present

## 2019-05-18 DIAGNOSIS — Z95828 Presence of other vascular implants and grafts: Secondary | ICD-10-CM

## 2019-05-18 DIAGNOSIS — E039 Hypothyroidism, unspecified: Secondary | ICD-10-CM | POA: Diagnosis not present

## 2019-05-18 DIAGNOSIS — I70223 Atherosclerosis of native arteries of extremities with rest pain, bilateral legs: Secondary | ICD-10-CM | POA: Diagnosis not present

## 2019-05-18 DIAGNOSIS — M1990 Unspecified osteoarthritis, unspecified site: Secondary | ICD-10-CM | POA: Diagnosis present

## 2019-05-18 DIAGNOSIS — M810 Age-related osteoporosis without current pathological fracture: Secondary | ICD-10-CM | POA: Diagnosis present

## 2019-05-18 DIAGNOSIS — Z95820 Peripheral vascular angioplasty status with implants and grafts: Secondary | ICD-10-CM

## 2019-05-18 DIAGNOSIS — J449 Chronic obstructive pulmonary disease, unspecified: Secondary | ICD-10-CM | POA: Diagnosis not present

## 2019-05-18 DIAGNOSIS — K219 Gastro-esophageal reflux disease without esophagitis: Secondary | ICD-10-CM | POA: Diagnosis present

## 2019-05-18 DIAGNOSIS — Z8679 Personal history of other diseases of the circulatory system: Secondary | ICD-10-CM | POA: Diagnosis present

## 2019-05-18 HISTORY — PX: ENDOVASCULAR STENT GRAFT (AAA): CATH118280

## 2019-05-18 HISTORY — PX: ABDOMINAL AORTIC ENDOVASCULAR STENT GRAFT: SHX5707

## 2019-05-18 LAB — ABO/RH: ABO/RH(D): O POS

## 2019-05-18 LAB — PROTIME-INR
INR: 1.1 (ref 0.8–1.2)
Prothrombin Time: 14.5 seconds (ref 11.4–15.2)

## 2019-05-18 LAB — GLUCOSE, CAPILLARY: Glucose-Capillary: 111 mg/dL — ABNORMAL HIGH (ref 70–99)

## 2019-05-18 LAB — MRSA PCR SCREENING: MRSA by PCR: NEGATIVE

## 2019-05-18 SURGERY — INSERTION, ENDOVASCULAR STENT GRAFT, AORTA, ABDOMINAL
Anesthesia: General | Laterality: Bilateral

## 2019-05-18 SURGERY — ENDOVASCULAR STENT GRAFT (AAA)
Anesthesia: General

## 2019-05-18 MED ORDER — ROSUVASTATIN CALCIUM 10 MG PO TABS
20.0000 mg | ORAL_TABLET | Freq: Every evening | ORAL | Status: DC
  Administered 2019-05-18: 18:00:00 20 mg via ORAL
  Filled 2019-05-18: qty 2

## 2019-05-18 MED ORDER — PROPOFOL 10 MG/ML IV BOLUS
INTRAVENOUS | Status: AC
  Filled 2019-05-18: qty 20

## 2019-05-18 MED ORDER — ASPIRIN EC 81 MG PO TBEC
81.0000 mg | DELAYED_RELEASE_TABLET | Freq: Every day | ORAL | Status: DC
  Administered 2019-05-19: 81 mg via ORAL
  Filled 2019-05-18: qty 1

## 2019-05-18 MED ORDER — HEPARIN SODIUM (PORCINE) 1000 UNIT/ML IJ SOLN
INTRAMUSCULAR | Status: AC
  Filled 2019-05-18: qty 1

## 2019-05-18 MED ORDER — NITROGLYCERIN IN D5W 200-5 MCG/ML-% IV SOLN
INTRAVENOUS | Status: AC
  Filled 2019-05-18: qty 250

## 2019-05-18 MED ORDER — EPHEDRINE SULFATE 50 MG/ML IJ SOLN
INTRAMUSCULAR | Status: DC | PRN
  Administered 2019-05-18 (×3): 5 mg via INTRAVENOUS
  Administered 2019-05-18: 10 mg via INTRAVENOUS
  Administered 2019-05-18 (×2): 5 mg via INTRAVENOUS

## 2019-05-18 MED ORDER — ROCURONIUM BROMIDE 50 MG/5ML IV SOLN
INTRAVENOUS | Status: AC
  Filled 2019-05-18: qty 1

## 2019-05-18 MED ORDER — SODIUM CHLORIDE 0.9 % IV SOLN
INTRAVENOUS | Status: DC
  Administered 2019-05-18: 13:00:00 via INTRAVENOUS

## 2019-05-18 MED ORDER — MAGNESIUM OXIDE 400 (241.3 MG) MG PO TABS
400.0000 mg | ORAL_TABLET | Freq: Every evening | ORAL | Status: DC
  Administered 2019-05-18: 18:00:00 400 mg via ORAL
  Filled 2019-05-18: qty 1

## 2019-05-18 MED ORDER — ACETAMINOPHEN 650 MG RE SUPP: 325.0000 mg | RECTAL | Status: DC | PRN

## 2019-05-18 MED ORDER — DONEPEZIL HCL 5 MG PO TABS
10.0000 mg | ORAL_TABLET | Freq: Every day | ORAL | Status: DC
  Administered 2019-05-18: 10 mg via ORAL
  Filled 2019-05-18 (×2): qty 2

## 2019-05-18 MED ORDER — BUPIVACAINE-EPINEPHRINE (PF) 0.25% -1:200000 IJ SOLN
INTRAMUSCULAR | Status: AC
  Filled 2019-05-18: qty 30

## 2019-05-18 MED ORDER — ACETAMINOPHEN 500 MG PO TABS: 500.0000 mg | ORAL_TABLET | Freq: Four times a day (QID) | ORAL | Status: DC | PRN

## 2019-05-18 MED ORDER — LIDOCAINE HCL (CARDIAC) PF 100 MG/5ML IV SOSY
PREFILLED_SYRINGE | INTRAVENOUS | Status: DC | PRN
  Administered 2019-05-18: 100 mg via INTRAVENOUS

## 2019-05-18 MED ORDER — ONDANSETRON HCL 4 MG/2ML IJ SOLN: 4.0000 mg | Freq: Four times a day (QID) | INTRAMUSCULAR | Status: DC | PRN

## 2019-05-18 MED ORDER — MAGNESIUM SULFATE 2 GM/50ML IV SOLN: 2.0000 g | Freq: Every day | INTRAVENOUS | Status: DC | PRN

## 2019-05-18 MED ORDER — MOMETASONE FURO-FORMOTEROL FUM 200-5 MCG/ACT IN AERO
2.0000 | INHALATION_SPRAY | Freq: Two times a day (BID) | RESPIRATORY_TRACT | Status: DC
  Administered 2019-05-18 – 2019-05-19 (×2): 2 via RESPIRATORY_TRACT
  Filled 2019-05-18: qty 8.8

## 2019-05-18 MED ORDER — CHLORHEXIDINE GLUCONATE CLOTH 2 % EX PADS
6.0000 | MEDICATED_PAD | Freq: Every day | CUTANEOUS | Status: DC
  Administered 2019-05-18 – 2019-05-19 (×2): 6 via TOPICAL

## 2019-05-18 MED ORDER — IODIXANOL 320 MG/ML IV SOLN
INTRAVENOUS | Status: DC | PRN
  Administered 2019-05-18: 45 mL

## 2019-05-18 MED ORDER — ONDANSETRON HCL 4 MG/2ML IJ SOLN
INTRAMUSCULAR | Status: DC | PRN
  Administered 2019-05-18: 4 mg via INTRAVENOUS

## 2019-05-18 MED ORDER — DOCUSATE SODIUM 100 MG PO CAPS
100.0000 mg | ORAL_CAPSULE | Freq: Every day | ORAL | Status: DC
  Administered 2019-05-19: 100 mg via ORAL
  Filled 2019-05-18: qty 1

## 2019-05-18 MED ORDER — VITAMIN C 500 MG PO TABS
1000.0000 mg | ORAL_TABLET | Freq: Every day | ORAL | Status: DC
  Administered 2019-05-19: 11:00:00 1000 mg via ORAL
  Filled 2019-05-18: qty 2

## 2019-05-18 MED ORDER — FLUTICASONE PROPIONATE 50 MCG/ACT NA SUSP
2.0000 | Freq: Every day | NASAL | Status: DC
  Administered 2019-05-19: 2 via NASAL
  Filled 2019-05-18: qty 16

## 2019-05-18 MED ORDER — CYANOCOBALAMIN 500 MCG PO TABS
500.0000 ug | ORAL_TABLET | Freq: Every day | ORAL | Status: DC
  Administered 2019-05-19: 500 ug via ORAL
  Filled 2019-05-18: qty 1

## 2019-05-18 MED ORDER — FAMOTIDINE IN NACL 20-0.9 MG/50ML-% IV SOLN
20.0000 mg | Freq: Two times a day (BID) | INTRAVENOUS | Status: DC
  Administered 2019-05-18: 20 mg via INTRAVENOUS
  Filled 2019-05-18 (×2): qty 50

## 2019-05-18 MED ORDER — HEPARIN SODIUM (PORCINE) 1000 UNIT/ML IJ SOLN
INTRAMUSCULAR | Status: DC | PRN
  Administered 2019-05-18: 5000 [IU] via INTRAVENOUS

## 2019-05-18 MED ORDER — VANCOMYCIN HCL IN DEXTROSE 1-5 GM/200ML-% IV SOLN
1000.0000 mg | Freq: Two times a day (BID) | INTRAVENOUS | Status: AC
  Administered 2019-05-18 (×2): 1000 mg via INTRAVENOUS
  Filled 2019-05-18 (×2): qty 200

## 2019-05-18 MED ORDER — CLOPIDOGREL BISULFATE 75 MG PO TABS
75.0000 mg | ORAL_TABLET | Freq: Every day | ORAL | Status: DC
  Administered 2019-05-19: 06:00:00 75 mg via ORAL
  Filled 2019-05-18: qty 1

## 2019-05-18 MED ORDER — CEFAZOLIN SODIUM-DEXTROSE 2-4 GM/100ML-% IV SOLN
INTRAVENOUS | Status: AC
  Filled 2019-05-18: qty 100

## 2019-05-18 MED ORDER — SUCCINYLCHOLINE CHLORIDE 20 MG/ML IJ SOLN
INTRAMUSCULAR | Status: DC | PRN
  Administered 2019-05-18: 120 mg via INTRAVENOUS

## 2019-05-18 MED ORDER — NITROGLYCERIN IN D5W 200-5 MCG/ML-% IV SOLN: 5.0000 ug/min | INTRAVENOUS | Status: DC

## 2019-05-18 MED ORDER — MORPHINE SULFATE (PF) 2 MG/ML IV SOLN
2.0000 mg | INTRAVENOUS | Status: DC | PRN
  Administered 2019-05-19: 2 mg via INTRAVENOUS
  Filled 2019-05-18: qty 1

## 2019-05-18 MED ORDER — SUGAMMADEX SODIUM 200 MG/2ML IV SOLN
INTRAVENOUS | Status: DC | PRN
  Administered 2019-05-18: 200 mg via INTRAVENOUS

## 2019-05-18 MED ORDER — POTASSIUM CHLORIDE CRYS ER 20 MEQ PO TBCR: 20.0000 meq | EXTENDED_RELEASE_TABLET | Freq: Every day | ORAL | Status: DC | PRN

## 2019-05-18 MED ORDER — FENTANYL CITRATE (PF) 100 MCG/2ML IJ SOLN
INTRAMUSCULAR | Status: AC
  Filled 2019-05-18: qty 2

## 2019-05-18 MED ORDER — PNEUMOCOCCAL VAC POLYVALENT 25 MCG/0.5ML IJ INJ: 0.5000 mL | INJECTION | INTRAMUSCULAR | Status: DC

## 2019-05-18 MED ORDER — SODIUM CHLORIDE 0.9 % IV SOLN
INTRAVENOUS | Status: DC | PRN
  Administered 2019-05-18: 50 ug/min via INTRAVENOUS

## 2019-05-18 MED ORDER — ONDANSETRON HCL 4 MG/2ML IJ SOLN
4.0000 mg | Freq: Four times a day (QID) | INTRAMUSCULAR | Status: DC | PRN
  Administered 2019-05-19: 01:00:00 4 mg via INTRAVENOUS
  Filled 2019-05-18: qty 2

## 2019-05-18 MED ORDER — SODIUM CHLORIDE 0.9 % IV SOLN: 500.0000 mL | Freq: Once | INTRAVENOUS | Status: DC | PRN

## 2019-05-18 MED ORDER — INFLUENZA VAC A&B SA ADJ QUAD 0.5 ML IM PRSY
0.5000 mL | PREFILLED_SYRINGE | INTRAMUSCULAR | Status: DC
  Filled 2019-05-18: qty 0.5

## 2019-05-18 MED ORDER — APIXABAN 5 MG PO TABS
5.0000 mg | ORAL_TABLET | Freq: Two times a day (BID) | ORAL | Status: DC
  Administered 2019-05-19: 5 mg via ORAL
  Filled 2019-05-18: qty 1

## 2019-05-18 MED ORDER — NITROGLYCERIN 0.4 MG SL SUBL: 0.4000 mg | SUBLINGUAL_TABLET | SUBLINGUAL | Status: DC | PRN

## 2019-05-18 MED ORDER — CHLORHEXIDINE GLUCONATE CLOTH 2 % EX PADS: 6.0000 | MEDICATED_PAD | Freq: Once | CUTANEOUS | Status: DC

## 2019-05-18 MED ORDER — FENTANYL CITRATE (PF) 100 MCG/2ML IJ SOLN
INTRAMUSCULAR | Status: DC | PRN
  Administered 2019-05-18 (×2): 50 ug via INTRAVENOUS

## 2019-05-18 MED ORDER — PANTOPRAZOLE SODIUM 40 MG PO TBEC
40.0000 mg | DELAYED_RELEASE_TABLET | Freq: Every day | ORAL | Status: DC
  Administered 2019-05-19: 11:00:00 40 mg via ORAL
  Filled 2019-05-18: qty 1

## 2019-05-18 MED ORDER — ONDANSETRON HCL 4 MG/2ML IJ SOLN
INTRAMUSCULAR | Status: AC
  Filled 2019-05-18: qty 2

## 2019-05-18 MED ORDER — SODIUM CHLORIDE 0.9 % IV SOLN
INTRAVENOUS | Status: DC
  Administered 2019-05-18: 08:00:00 via INTRAVENOUS

## 2019-05-18 MED ORDER — OCUVITE-LUTEIN PO CAPS
ORAL_CAPSULE | Freq: Two times a day (BID) | ORAL | Status: DC
  Administered 2019-05-18: 23:00:00 1 via ORAL
  Administered 2019-05-19: 10:00:00 via ORAL
  Filled 2019-05-18 (×3): qty 1

## 2019-05-18 MED ORDER — PROPOFOL 10 MG/ML IV BOLUS
INTRAVENOUS | Status: DC | PRN
  Administered 2019-05-18: 90 mg via INTRAVENOUS

## 2019-05-18 MED ORDER — DEXAMETHASONE SODIUM PHOSPHATE 10 MG/ML IJ SOLN
INTRAMUSCULAR | Status: DC | PRN
  Administered 2019-05-18: 10 mg via INTRAVENOUS

## 2019-05-18 MED ORDER — FENTANYL CITRATE (PF) 100 MCG/2ML IJ SOLN: 25.0000 ug | INTRAMUSCULAR | Status: DC | PRN

## 2019-05-18 MED ORDER — LIDOCAINE HCL (PF) 2 % IJ SOLN
INTRAMUSCULAR | Status: AC
  Filled 2019-05-18: qty 10

## 2019-05-18 MED ORDER — FINASTERIDE 5 MG PO TABS
5.0000 mg | ORAL_TABLET | Freq: Every day | ORAL | Status: DC
  Administered 2019-05-19: 5 mg via ORAL
  Filled 2019-05-18: qty 1

## 2019-05-18 MED ORDER — OXYMETAZOLINE HCL 0.05 % NA SOLN
NASAL | Status: AC
  Filled 2019-05-18: qty 30

## 2019-05-18 MED ORDER — METOPROLOL TARTRATE 5 MG/5ML IV SOLN: 2.0000 mg | INTRAVENOUS | Status: DC | PRN

## 2019-05-18 MED ORDER — DEXAMETHASONE SODIUM PHOSPHATE 10 MG/ML IJ SOLN
INTRAMUSCULAR | Status: AC
  Filled 2019-05-18: qty 1

## 2019-05-18 MED ORDER — FENTANYL CITRATE (PF) 100 MCG/2ML IJ SOLN: 12.5000 ug | Freq: Once | INTRAMUSCULAR | Status: DC | PRN

## 2019-05-18 MED ORDER — PHENYLEPHRINE HCL (PRESSORS) 10 MG/ML IV SOLN
INTRAVENOUS | Status: DC | PRN
  Administered 2019-05-18: 100 ug via INTRAVENOUS

## 2019-05-18 MED ORDER — ACETAMINOPHEN 325 MG PO TABS
325.0000 mg | ORAL_TABLET | ORAL | Status: DC | PRN
  Administered 2019-05-18: 650 mg via ORAL
  Filled 2019-05-18: qty 2

## 2019-05-18 MED ORDER — EZETIMIBE 10 MG PO TABS
10.0000 mg | ORAL_TABLET | Freq: Every day | ORAL | Status: DC
  Administered 2019-05-18: 10 mg via ORAL
  Filled 2019-05-18 (×2): qty 1

## 2019-05-18 MED ORDER — MIRABEGRON ER 50 MG PO TB24
50.0000 mg | ORAL_TABLET | Freq: Every evening | ORAL | Status: DC
  Administered 2019-05-18: 50 mg via ORAL
  Filled 2019-05-18 (×2): qty 1

## 2019-05-18 MED ORDER — CEFAZOLIN SODIUM-DEXTROSE 2-4 GM/100ML-% IV SOLN
2.0000 g | Freq: Three times a day (TID) | INTRAVENOUS | Status: AC
  Administered 2019-05-18 (×2): 2 g via INTRAVENOUS
  Filled 2019-05-18 (×2): qty 100

## 2019-05-18 MED ORDER — METOPROLOL TARTRATE 25 MG PO TABS
25.0000 mg | ORAL_TABLET | Freq: Two times a day (BID) | ORAL | Status: DC
  Administered 2019-05-19: 25 mg via ORAL
  Filled 2019-05-18 (×2): qty 1

## 2019-05-18 MED ORDER — SUCCINYLCHOLINE CHLORIDE 20 MG/ML IJ SOLN
INTRAMUSCULAR | Status: AC
  Filled 2019-05-18: qty 1

## 2019-05-18 MED ORDER — LABETALOL HCL 5 MG/ML IV SOLN: 10.0000 mg | INTRAVENOUS | Status: DC | PRN

## 2019-05-18 MED ORDER — VITAMIN D 25 MCG (1000 UNIT) PO TABS
1000.0000 [IU] | ORAL_TABLET | Freq: Every evening | ORAL | Status: DC
  Filled 2019-05-18: qty 1

## 2019-05-18 MED ORDER — ONDANSETRON HCL 4 MG/2ML IJ SOLN: 4.0000 mg | Freq: Once | INTRAMUSCULAR | Status: DC | PRN

## 2019-05-18 MED ORDER — CEFAZOLIN SODIUM-DEXTROSE 2-4 GM/100ML-% IV SOLN
2.0000 g | INTRAVENOUS | Status: AC
  Administered 2019-05-18: 2 g via INTRAVENOUS

## 2019-05-18 MED ORDER — HYDRALAZINE HCL 20 MG/ML IJ SOLN: 5.0000 mg | INTRAMUSCULAR | Status: DC | PRN

## 2019-05-18 MED ORDER — GUAIFENESIN-DM 100-10 MG/5ML PO SYRP: 15.0000 mL | ORAL_SOLUTION | ORAL | Status: DC | PRN

## 2019-05-18 MED ORDER — LISINOPRIL 5 MG PO TABS
2.5000 mg | ORAL_TABLET | Freq: Every day | ORAL | Status: DC
  Administered 2019-05-19: 11:00:00 2.5 mg via ORAL
  Filled 2019-05-18: qty 1

## 2019-05-18 MED ORDER — PHENOL 1.4 % MT LIQD
1.0000 | OROMUCOSAL | Status: DC | PRN
  Filled 2019-05-18: qty 177

## 2019-05-18 MED ORDER — ALUM & MAG HYDROXIDE-SIMETH 200-200-20 MG/5ML PO SUSP: 15.0000 mL | ORAL | Status: DC | PRN

## 2019-05-18 MED ORDER — ROCURONIUM BROMIDE 100 MG/10ML IV SOLN
INTRAVENOUS | Status: DC | PRN
  Administered 2019-05-18: 10 mg via INTRAVENOUS
  Administered 2019-05-18: 30 mg via INTRAVENOUS
  Administered 2019-05-18 (×2): 20 mg via INTRAVENOUS
  Administered 2019-05-18: 10 mg via INTRAVENOUS

## 2019-05-18 MED ORDER — LACTATED RINGERS IV SOLN
INTRAVENOUS | Status: DC
  Administered 2019-05-18: 08:00:00 via INTRAVENOUS

## 2019-05-18 MED ORDER — TAMSULOSIN HCL 0.4 MG PO CAPS
0.4000 mg | ORAL_CAPSULE | Freq: Every evening | ORAL | Status: DC
  Administered 2019-05-18: 18:00:00 0.4 mg via ORAL
  Filled 2019-05-18: qty 1

## 2019-05-18 SURGICAL SUPPLY — 67 items
ADH SKN CLS APL DERMABOND .7 (GAUZE/BANDAGES/DRESSINGS) ×2
APPLIER CLIP 11 MED OPEN (CLIP) ×2
APPLIER CLIP 13 LRG OPEN (CLIP) ×2
APPLIER CLIP 9.375 SM OPEN (CLIP) ×2
APR CLP LRG 13 20 CLIP (CLIP) ×1
APR CLP MED 11 20 MLT OPN (CLIP) ×1
APR CLP SM 9.3 20 MLT OPN (CLIP) ×1
BAG DECANTER FOR FLEXI CONT (MISCELLANEOUS) ×2 IMPLANT
BLADE SURG 15 STRL LF DISP TIS (BLADE) ×2 IMPLANT
BLADE SURG 15 STRL SS (BLADE) ×4
BLADE SURG SZ11 CARB STEEL (BLADE) ×2 IMPLANT
BOOT SUTURE AID YELLOW STND (SUTURE) ×4 IMPLANT
BRUSH SCRUB EZ  4% CHG (MISCELLANEOUS) ×1
BRUSH SCRUB EZ 4% CHG (MISCELLANEOUS) ×1 IMPLANT
CLIP APPLIE 11 MED OPEN (CLIP) ×1 IMPLANT
CLIP APPLIE 13 LRG OPEN (CLIP) ×1 IMPLANT
CLIP APPLIE 9.375 SM OPEN (CLIP) ×1 IMPLANT
COVER WAND RF STERILE (DRAPES) ×2 IMPLANT
DERMABOND ADVANCED (GAUZE/BANDAGES/DRESSINGS) ×2
DERMABOND ADVANCED .7 DNX12 (GAUZE/BANDAGES/DRESSINGS) ×2 IMPLANT
ELECT CAUTERY BLADE 6.4 (BLADE) ×4 IMPLANT
ELECT REM PT RETURN 9FT ADLT (ELECTROSURGICAL) ×4
ELECTRODE REM PT RTRN 9FT ADLT (ELECTROSURGICAL) ×2 IMPLANT
GAUZE 4X4 16PLY RFD (DISPOSABLE) ×4 IMPLANT
GLOVE BIO SURGEON STRL SZ7 (GLOVE) ×2 IMPLANT
GLOVE BIO SURGEON STRL SZ8 (GLOVE) ×2 IMPLANT
GOWN STRL REUS W/ TWL LRG LVL3 (GOWN DISPOSABLE) ×2 IMPLANT
GOWN STRL REUS W/ TWL XL LVL3 (GOWN DISPOSABLE) ×2 IMPLANT
GOWN STRL REUS W/TWL LRG LVL3 (GOWN DISPOSABLE) ×4
GOWN STRL REUS W/TWL XL LVL3 (GOWN DISPOSABLE) ×4
HEMOSTAT SURGICEL 2X3 (HEMOSTASIS) ×4 IMPLANT
IV NS 1000ML (IV SOLUTION) ×2
IV NS 1000ML BAXH (IV SOLUTION) ×1 IMPLANT
LABEL OR SOLS (LABEL) ×2 IMPLANT
LOOP RED MAXI  1X406MM (MISCELLANEOUS) ×4
LOOP VESSEL MAXI 1X406 RED (MISCELLANEOUS) ×4 IMPLANT
LOOP VESSEL MINI 0.8X406 BLUE (MISCELLANEOUS) ×2 IMPLANT
LOOPS BLUE MINI 0.8X406MM (MISCELLANEOUS) ×2
NDL HYPO 25X1 1.5 SAFETY (NEEDLE) ×1 IMPLANT
NDL SAFETY ECLIPSE 18X1.5 (NEEDLE) ×1 IMPLANT
NEEDLE HYPO 18GX1.5 SHARP (NEEDLE) ×2
NEEDLE HYPO 25X1 1.5 SAFETY (NEEDLE) ×2 IMPLANT
PACK BASIN MAJOR ARMC (MISCELLANEOUS) ×2 IMPLANT
PENCIL ELECTRO HAND CTR (MISCELLANEOUS) ×2 IMPLANT
SPONGE LAP 18X18 RF (DISPOSABLE) ×4 IMPLANT
SUT MNCRL 4-0 (SUTURE) ×8
SUT MNCRL 4-0 27XMFL (SUTURE) ×4
SUT MNCRL+ 5-0 UNDYED PC-3 (SUTURE) ×2 IMPLANT
SUT MONOCRYL 5-0 (SUTURE) ×2
SUT PROLENE 5 0 RB 1 DA (SUTURE) ×8 IMPLANT
SUT PROLENE 6 0 BV (SUTURE) ×20 IMPLANT
SUT SILK 2 0 (SUTURE) ×2
SUT SILK 2-0 18XBRD TIE 12 (SUTURE) ×1 IMPLANT
SUT SILK 3 0 (SUTURE) ×2
SUT SILK 3-0 18XBRD TIE 12 (SUTURE) ×1 IMPLANT
SUT SILK 4 0 (SUTURE) ×2
SUT SILK 4-0 18XBRD TIE 12 (SUTURE) ×1 IMPLANT
SUT VIC AB 2-0 CT1 (SUTURE) ×12 IMPLANT
SUT VIC AB 4-0 SH 27 (SUTURE) ×4
SUT VIC AB 4-0 SH 27XANBCTRL (SUTURE) ×2 IMPLANT
SUT VICRYL+ 3-0 36IN CT-1 (SUTURE) ×12 IMPLANT
SUTURE MNCRL 4-0 27XMF (SUTURE) ×4 IMPLANT
SYR 10ML LL (SYRINGE) ×2 IMPLANT
SYR 20ML LL LF (SYRINGE) ×4 IMPLANT
SYR 3ML LL SCALE MARK (SYRINGE) ×2 IMPLANT
SYR BULB 3OZ (MISCELLANEOUS) ×2 IMPLANT
TOWEL OR 17X26 4PK STRL BLUE (TOWEL DISPOSABLE) ×2 IMPLANT

## 2019-05-18 SURGICAL SUPPLY — 34 items
BALLN LUTONIX DCB 7X60X130 (BALLOONS) ×2
BALLN ULTRVRSE 10X60X75 (BALLOONS) ×4
BALLOON LUTONIX DCB 7X60X130 (BALLOONS) IMPLANT
BALLOON ULTRVRSE 10X60X75 (BALLOONS) IMPLANT
CANNULA 5F STIFF (CANNULA) ×1 IMPLANT
CATH ACCU-VU SIZ PIG 5F 70CM (CATHETERS) ×1 IMPLANT
CATH BALLN CODA 9X100X32 (BALLOONS) ×1 IMPLANT
CATH BEACON 5 .035 65 KMP TIP (CATHETERS) ×1 IMPLANT
COVER PROBE U/S 5X48 (MISCELLANEOUS) ×1 IMPLANT
DEVICE CLOSURE PERCLS PRGLD 6F (VASCULAR PRODUCTS) IMPLANT
DEVICE PRESTO INFLATION (MISCELLANEOUS) ×4 IMPLANT
DEVICE TORQUE .025-.038 (MISCELLANEOUS) ×1 IMPLANT
DRYSEAL FLEXSHEATH 12FR 33CM (SHEATH) ×1
DRYSEAL FLEXSHEATH 16FR 33CM (SHEATH) ×1
EXCLUDER TNK LEG 23MX12X18 (Endovascular Graft) IMPLANT
EXCLUDER TRUNK LEG 23MX12X18 (Endovascular Graft) ×2 IMPLANT
GLIDEWIRE STIFF .35X180X3 HYDR (WIRE) ×1 IMPLANT
LEG CONTRALATERAL 16X12X14 (Vascular Products) ×2 IMPLANT
NDL ENTRY 21GA 7CM ECHOTIP (NEEDLE) IMPLANT
NEEDLE ENTRY 21GA 7CM ECHOTIP (NEEDLE) ×2 IMPLANT
PACK ANGIOGRAPHY (CUSTOM PROCEDURE TRAY) ×1 IMPLANT
PERCLOSE PROGLIDE 6F (VASCULAR PRODUCTS) ×10
SET INTRO CAPELLA COAXIAL (SET/KITS/TRAYS/PACK) ×1 IMPLANT
SHEATH BRITE TIP 6FRX11 (SHEATH) ×2 IMPLANT
SHEATH BRITE TIP 8FRX11 (SHEATH) ×2 IMPLANT
SHEATH DRYSEAL FLEX 12FR 33CM (SHEATH) IMPLANT
SHEATH DRYSEAL FLEX 16FR 33CM (SHEATH) IMPLANT
SPONGE XRAY 4X4 16PLY STRL (MISCELLANEOUS) ×2 IMPLANT
STENT GRAFT CONTRALAT 16X12X14 (Vascular Products) IMPLANT
STENT LIFESTAR 8X60X80 (Permanent Stent) ×1 IMPLANT
SYR MEDRAD MARK 7 150ML (SYRINGE) ×1 IMPLANT
TUBING CONTRAST HIGH PRESS 72 (TUBING) ×1 IMPLANT
WIRE AMPLATZ SSTIFF .035X260CM (WIRE) ×2 IMPLANT
WIRE J 3MM .035X145CM (WIRE) ×2 IMPLANT

## 2019-05-18 NOTE — Anesthesia Preprocedure Evaluation (Deleted)
Anesthesia Evaluation  Patient identified by MRN, date of birth, ID band Patient awake    Reviewed: Allergy & Precautions, NPO status , Patient's Chart, lab work & pertinent test results, reviewed documented beta blocker date and time   Airway Mallampati: III       Dental  (+) Upper Dentures, Lower Dentures   Pulmonary shortness of breath, sleep apnea, Continuous Positive Airway Pressure Ventilation and Oxygen sleep apnea , COPD, former smoker,    Pulmonary exam normal        Cardiovascular hypertension, Pt. on medications and Pt. on home beta blockers + CAD and + Peripheral Vascular Disease  Normal cardiovascular exam+ dysrhythmias Atrial Fibrillation      Neuro/Psych PSYCHIATRIC DISORDERS Anxiety Dementia TIA Neuromuscular disease    GI/Hepatic Neg liver ROS, hiatal hernia, GERD  ,  Endo/Other  Hypothyroidism   Renal/GU negative Renal ROS     Musculoskeletal  (+) Arthritis , Osteoarthritis,    Abdominal   Peds  Hematology  (+) anemia ,   Anesthesia Other Findings Sleep apnea. Uses CPAP occasionally. Takes it off at night frequently. O2 at 2L at night only. RBBB for years. A fib during exercise, otherwise NSR. Echo few years ago OK.  Reproductive/Obstetrics                             Anesthesia Physical  Anesthesia Plan  ASA: III  Anesthesia Plan: General   Post-op Pain Management:    Induction: Intravenous  PONV Risk Score and Plan:   Airway Management Planned: Oral ETT  Additional Equipment:   Intra-op Plan:   Post-operative Plan: Extubation in OR  Informed Consent: I have reviewed the patients History and Physical, chart, labs and discussed the procedure including the risks, benefits and alternatives for the proposed anesthesia with the patient or authorized representative who has indicated his/her understanding and acceptance.       Plan Discussed with: CRNA and  Surgeon  Anesthesia Plan Comments:         Anesthesia Quick Evaluation

## 2019-05-18 NOTE — Anesthesia Procedure Notes (Signed)
Procedure Name: Intubation Date/Time: 05/18/2019 8:33 AM Performed by: Caryl Asp, CRNA Pre-anesthesia Checklist: Patient identified, Patient being monitored, Timeout performed, Emergency Drugs available and Suction available Patient Re-evaluated:Patient Re-evaluated prior to induction Oxygen Delivery Method: Circle system utilized Preoxygenation: Pre-oxygenation with 100% oxygen Induction Type: IV induction Ventilation: Mask ventilation without difficulty Laryngoscope Size: Mac and 3 Grade View: Grade I Tube type: Oral Tube size: 7.5 mm Number of attempts: 1 Airway Equipment and Method: Stylet Placement Confirmation: ETT inserted through vocal cords under direct vision,  positive ETCO2 and breath sounds checked- equal and bilateral Secured at: 23 cm Tube secured with: Tape Dental Injury: Teeth and Oropharynx as per pre-operative assessment

## 2019-05-18 NOTE — Transfer of Care (Signed)
Immediate Anesthesia Transfer of Care Note  Patient: [name]  Procedure(s) Performed: ABDOMINAL AORTIC ENDOVASCULAR STENT GRAFT (Bilateral )  Patient Location: PACU  Anesthesia Type:General  Level of Consciousness: drowsy  Airway & Oxygen Therapy: Patient Spontanous Breathing and Patient connected to face mask oxygen  Post-op Assessment: Report given to RN and Post -op Vital signs reviewed and stable  Post vital signs: Reviewed and stable  Last Vitals:  Vitals Value Taken Time  BP 150/85 05/18/19 1044  Temp    Pulse 79 05/18/19 1047  Resp 18 05/18/19 1047  SpO2 98 % 05/18/19 1047  Vitals shown include unvalidated device data.  Last Pain:  Vitals:   05/18/19 0707  TempSrc: Tympanic  PainSc: 4          Complications: No apparent anesthesia complications

## 2019-05-18 NOTE — H&P (Signed)
[facility] Admission History & Physical  MRN : [id number]  [name] is a 85 y.o. (February 07, 1934) male who presents with chief complaint of No chief complaint on file. [name]  History of Present Illness: patient presents today for repair of his AAA.  He has had his preoperative testing and has been deemed an acceptable risk for surgery. He has a 5 cm AAA and also has iliac occlusive disease/PAD that will need treated as well.  He has no new complaints today.   Current Facility-Administered Medications  Medication Dose Route Frequency Provider Last Rate Last Dose  . ceFAZolin (ANCEF) 2-4 GM/100ML-% IVPB           . ceFAZolin (ANCEF) IVPB 2g/100 mL premix  2 g Intravenous On Call to [place], [place], NP      . Chlorhexidine Gluconate Cloth 2 % PADS 6 each  6 each Topical Once Kris Hartmann, NP       And  . Chlorhexidine Gluconate Cloth 2 % PADS 6 each  6 each Topical Once Kris Hartmann, NP      . lactated ringers infusion   Intravenous Continuous Molli Barrows, MD        Past Medical History:  Diagnosis Date  . AAA (abdominal aortic aneurysm) ([place])   . Arthritis   . Atherosclerotic heart disease of native coronary artery without angina pectoris 1998   - s/p CABG, occluded SVG-D1; patent LIMA-LAD, SVG-OM, SVG- RCA  . Basal cell carcinoma of eyelid 01/03/2013   2019 - R side of Nose  . Benign neoplasm of ascending colon   . Benign neoplasm of descending colon   . Cancer [facility])    Skin cancer  . Colon polyps   . COPD (chronic obstructive pulmonary disease) ([place])   . Coronary artery disease   . Dementia ([place])   . Dysrhythmia    atrial fib  . Falls frequently   . GERD (gastroesophageal reflux disease)   . Hemorrhoid 02/10/2015  . History of hiatal hernia   . History of kidney stones   . HOH (hard of hearing)   . Hypertension   . Memory loss   . Osteoporosis   . PAF (paroxysmal atrial fibrillation) ([place])   . Prostate cancer ([place])   . Prostate  disorder   . Sciatic pain    Chronic  . Sleep apnea    cpap  . Temporary cerebral vascular dysfunction 02/10/2015   Had negative work-up.  July, 2012.  No medication changes, had forgot them that day, got dehydrated.   . Urinary incontinence     Past Surgical History:  Procedure Laterality Date  . ANKLE SURGERY    . Arch aortogram and carotid aortogram  06/02/2006   Dr Oneida Alar did surgery  . BASAL CELL CARCINOMA EXCISION  04/2016   Dermatology  . CARDIAC CATHETERIZATION  01/30/1998    (Dr. Linard Millers): Native LAD & mRCA CTO. 90% D1. Mod Cx. SVG-D1 CTO. SVG-RCA & SVG-OM along with LIMA-LAD patent;  LV NORMAL Fxn  . CAROTID ENDARTERECTOMY Left Oct. 29, 2007   Dr. Oneida Alar:  . CATARACT EXTRACTION W/PHACO Right 03/05/2017   Procedure: CATARACT EXTRACTION PHACO AND INTRAOCULAR LENS PLACEMENT (IOC);  Surgeon: Leandrew Koyanagi, MD;  Location: [facility] ORS;  Service: Ophthalmology;  Laterality: Right;  [place] 00:58.5 AP% 10.6 CDE 6.20 Fluid Pack lot # [id number]  . CATARACT EXTRACTION W/PHACO Left 04/15/2017   Procedure: CATARACT EXTRACTION PHACO AND INTRAOCULAR LENS PLACEMENT (IOC)  Left;  Surgeon: Leandrew Koyanagi, MD;  Location: [place];  Service: Ophthalmology;  Laterality: Left;  . COLONOSCOPY WITH PROPOFOL N/A 09/15/2017   Procedure: COLONOSCOPY WITH PROPOFOL;  Surgeon: Lucilla Lame, MD;  Location: [facility] ENDOSCOPY;  Service: Endoscopy;  Laterality: N/A;  . COLONOSCOPY WITH PROPOFOL N/A 10/26/2018   Procedure: COLONOSCOPY WITH PROPOFOL;  Surgeon: Lucilla Lame, MD;  Location: [facility] ENDOSCOPY;  Service: Endoscopy;  Laterality: N/A;  . CORONARY ARTERY BYPASS GRAFT  1998   LIMA-LAD, SVG-OM, SVG-RPDA, SVG-DIAG  . CPET / MET  12/2012   Mild chronotropic incompetence - read 82% of predicted; also reduced effort; peak VO2 15.7 / 75% (did not reach Max effort) -- suggested ischemic response in last 1.5 minutes of exercise. Normal pulmonary function on PFTs but poor response to  . DOPPLER  ECHOCARDIOGRAPHY  11/20/2010   EF =>55%; LV norm mild aortic scelorosis  . ESOPHAGOGASTRODUODENOSCOPY (EGD) WITH PROPOFOL N/A 09/15/2017   Procedure: ESOPHAGOGASTRODUODENOSCOPY (EGD) WITH PROPOFOL;  Surgeon: Lucilla Lame, MD;  Location: [facility] ENDOSCOPY;  Service: Endoscopy;  Laterality: N/A;  . FRACTURE SURGERY Right 03/19/2015   wrist  . HIATAL HERNIA REPAIR    . ILIAC ARTERY STENT  12/15/2005   PTA and direct stenting rgt and lft common iliac arteries  . KYPHOPLASTY N/A 04/05/2019   Procedure: KYPHOPLASTY T12, L3, L4 L5;  Surgeon: Hessie Knows, MD;  Location: [facility] ORS;  Service: Orthopedics;  Laterality: N/A;  . NM GATED MYOVIEW ([place] [facility])  12/2017   Low Risk - no ischemia or infarct.  EF > 65%. no RWMA  . NM MYOVIEW ([place] [facility]X)  11/11/2010; February 2016   a) TM STRESS----NORMAL PERFUSION ,EF 68%; b) EF 50-55%. Normal LV function. No significant ischemia or infarction.  . OPEN REDUCTION INTERNAL FIXATION (ORIF) DISTAL RADIAL FRACTURE Left 01/13/2019   Procedure: OPEN REDUCTION INTERNAL FIXATION (ORIF) DISTAL  RADIAL FRACTURE - LEFT - SLEEP APNEA;  Surgeon: Hessie Knows, MD;  Location: [facility] ORS;  Service: Orthopedics;  Laterality: Left;  . ORIF ELBOW FRACTURE Right 01/01/2017   Procedure: OPEN REDUCTION INTERNAL FIXATION (ORIF) ELBOW/OLECRANON FRACTURE;  Surgeon: Hessie Knows, MD;  Location: [facility] ORS;  Service: Orthopedics;  Laterality: Right;  . ORIF WRIST FRACTURE Right 03/19/2015   Procedure: OPEN REDUCTION INTERNAL FIXATION (ORIF) WRIST FRACTURE;  Surgeon: Hessie Knows, MD;  Location: [facility] ORS;  Service: Orthopedics;  Laterality: Right;  . THORACIC AORTA - CAROTID ANGIOGRAM  October 2007   Dr. 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 TRANSTHORACIC ECHOCARDIOGRAM  February 2016   [facility]: Normal LV function. Dilated left atrium.    Social History Social History   Tobacco Use  . Smoking status: Former  Smoker    Types: Cigarettes    Quit date: 08/04/1997    Years since quitting: 21.8  . Smokeless tobacco: Never Used  Substance Use Topics  . Alcohol use: No  . Drug use: No    Family History Family History  Problem Relation Age of Onset  . Ulcers Mother        Peptic  . Dementia Mother   . Alcohol abuse Father     Allergies  Allergen Reactions  . Clonazepam Other (See Comments)    Altered mental status  . Codeine Other (See Comments)    Altered mental status.  . Niacin And Related Other (See Comments)    Flushing of skin  . Tramadol     Other reaction(s): Hallucination  .  Hydrocodone     confusion  . Oxycodone Other (See Comments)    confusion confusion     REVIEW OF SYSTEMS (Negative unless checked)  Constitutional: '[]' ?Weight loss  '[]' ?Fever  '[]' ?Chills Cardiac: '[]' ?Chest pain   '[]' ?Chest pressure   '[x]' ?Palpitations   '[]' ?Shortness of breath when laying flat   '[]' ?Shortness of breath at rest   '[x]' ?Shortness of breath with exertion. Vascular:  '[x]' ?Pain in legs with walking   '[]' ?Pain in legs at rest   '[]' ?Pain in legs when laying flat   '[]' ?Claudication   '[]' ?Pain in feet when walking  '[]' ?Pain in feet at rest  '[]' ?Pain in feet when laying flat   '[]' ?History of DVT   '[]' ?Phlebitis   '[]' ?Swelling in legs   '[]' ?Varicose veins   '[]' ?Non-healing ulcers Pulmonary:   '[]' ?Uses home oxygen   '[]' ?Productive cough   '[]' ?Hemoptysis   '[]' ?Wheeze  '[x]' ?COPD   '[]' ?Asthma Neurologic:  '[]' ?Dizziness  '[]' ?Blackouts   '[]' ?Seizures   '[]' ?History of stroke   '[]' ?History of TIA  '[]' ?Aphasia   '[]' ?Temporary blindness   '[]' ?Dysphagia   '[]' ?Weakness or numbness in arms   '[]' ?Weakness or numbness in legs X positive for memory loss Musculoskeletal:  '[x]' ?Arthritis   '[]' ?Joint swelling   '[]' ?Joint pain   '[x]' ?Low back pain Hematologic:  '[]' ?Easy bruising  '[]' ?Easy bleeding   '[]' ?Hypercoagulable state   '[]' ?Anemic  '[]' ?Hepatitis Gastrointestinal:  '[]' ?Blood in stool   '[]' ?Vomiting blood  '[]' ?Gastroesophageal reflux/heartburn   '[]' ?Abdominal  pain Genitourinary:  '[]' ?Chronic kidney disease   '[x]' ?Difficult urination  '[]' ?Frequent urination  '[]' ?Burning with urination   '[]' ?Hematuria Skin:  '[]' ?Rashes   '[]' ?Ulcers   '[]' ?Wounds Psychological:  '[]' ?History of anxiety   '[]' ? History of major depression.  Physical Examination  Vitals:   05/18/19 0707  BP: (!) 156/95  Pulse: 77  Resp: 18  Temp: (!) 97.1 F (36.2 C)  TempSrc: Tympanic  SpO2: 95%   There is no height or weight on file to calculate BMI. Gen: WD/WN, NAD Head: [place]/AT, No temporalis wasting.  Ear/Nose/Throat: Hearing grossly intact, nares w/o erythema or drainage, oropharynx w/o Erythema/Exudate,  Eyes: Conjunctiva clear, sclera non-icteric Neck: Trachea midline.  No JVD.  Pulmonary:  Good air movement, respirations not labored, no use of accessory muscles.  Cardiac: irregular Vascular:  Vessel Right Left  Radial Palpable Palpable                          PT 1+ Palpable Trace Palpable  DP Trace Palpable 1+ Palpable   Gastrointestinal: soft, non-tender/non-distended. No guarding/reflex.  Musculoskeletal: M/S 5/5 throughout.  Extremities without ischemic changes.  No deformity or atrophy.  Neurologic: Sensation grossly intact in extremities.  Symmetrical.  Speech is fluent. Motor exam as listed above. Psychiatric: Judgment intact, Mood & affect appropriate for pt's clinical situation. Dermatologic: No rashes or ulcers noted.  No cellulitis or open wounds.      CBC Lab Results  Component Value Date   WBC 8.2 05/12/2019   HGB 12.6 (L) 05/12/2019   HCT 39.1 05/12/2019   MCV 95.8 05/12/2019   PLT 169 05/12/2019    BMET    Component Value Date/Time   NA 142 05/12/2019 1051   NA 146 (H) 10/14/2018 0813   NA 142 09/28/2014 0626   K 3.6 05/12/2019 1051   K 3.9 09/28/2014 0626   CL 109 05/12/2019 1051   CL 110 (H) 09/28/2014 0626   CO2 24 05/12/2019 1051   CO2 26 09/28/2014 0626   GLUCOSE 96  05/12/2019 1051   GLUCOSE 100 (H) 09/28/2014 0626   BUN 14  05/12/2019 1051   BUN 13 10/14/2018 0813   BUN 20 (H) 09/28/2014 0626   CREATININE 0.77 05/12/2019 1051   CREATININE 1.29 (H) 07/10/2017 1645   CALCIUM 9.3 05/12/2019 1051   CALCIUM 8.9 09/28/2014 0626   GFRNONAA >60 05/12/2019 1051   GFRNONAA 52 (L) 07/10/2017 1645   GFRAA >60 05/12/2019 1051   GFRAA 60 07/10/2017 1645   Estimated Creatinine Clearance: 76.8 mL/min (by C-G formula based on SCr of 0.77 mg/dL).  COAG Lab Results  Component Value Date   INR 1.3 (H) 05/12/2019   INR 1.3 (H) 01/06/2019   INR 1.1 09/27/2014    Radiology No results found.    Assessment/Plan  Essential hypertension blood pressure control important in reducing the progression of atherosclerotic disease. On appropriate oral medications.   Aorto-iliac disease This will complicate the endovascular repair but with the smaller sheath sizes being used today I think we can navigate through this.  He may also get some improvement in his lower extremity perfusion after stent graft repair.  He may need treatment for occlusive disease in the external iliac artery separate from the aneurysm repair.  PAF (paroxysmal atrial fibrillation) (HCC) CHA2DS2-VASc = 4. AC = Eliquis On anticoagulation and rate controlled  COPD (chronic obstructive pulmonary disease) Preoperative assessment prior to surgery may be necessary based off of his preop visits.  AAA (abdominal aortic aneurysm) without rupture ([place]) I have independently reviewed his scan from earlier this week.  This demonstrates a somewhat saccular appearing aneurysm in the mid to distal abdominal aorta measuring 5.0 cm.  He has iliac artery stents in place that appear to have some degree of restenosis as well as significant native iliac artery disease below the stents.  His common femoral arteries are mild to moderately calcified mostly in the posterior segments.  Recommend: The aneurysm is > 5 cm and therefore should undergo repair. Patient is status  post CT scan of the abdominal aorta. The patient is a candidate for endovascular repair although concomitant treatment of iliac artery occlusive disease will likely need to be performed as well to allow access.   Leotis Pain, MD  05/18/2019 7:44 AM

## 2019-05-18 NOTE — Anesthesia Post-op Follow-up Note (Signed)
Anesthesia QCDR form completed.        

## 2019-05-18 NOTE — Op Note (Signed)
OPERATIVE NOTE   PROCEDURE: 1. US guidance for vascular access, bilateral femoral arteries 2. Catheter placement into aorta from bilateral femoral approaches 3. Placement of a 23 mm proximal, 18 cm length Gore Excluder Endoprosthesis main body right with a 12 mm x 14 cm left contralateral limb 4. Placement of a left external iliac artery stent for occlusive disease using an 8 mm diameter by 6 cm length stent postdilated with a 7 mm diameter Lutonix drug-coated balloon 5. ProGlide closure devices bilateral femoral arteries  PRE-OPERATIVE DIAGNOSIS: AAA, PAD with claudication  POST-OPERATIVE DIAGNOSIS: same  SURGEON: Leotis Pain, MD and Hortencia Pilar, MD - Co-surgeons  ANESTHESIA: General  ESTIMATED BLOOD LOSS: 25 cc  FINDING(S): 1.  AAA, significant iliac artery occlusive disease in the left external iliac artery and the right common iliac artery  SPECIMEN(S):  none  INDICATIONS:   [name] is a 85 y.o. male who presents with a 5 cm abdominal aortic aneurysm as well as significant iliac occlusive disease and claudication symptoms. The anatomy was suitable for endovascular repair.  Risks and benefits of repair in an endovascular fashion were discussed and informed consent was obtained. Co-surgeons are used to expedite the procedure and reduce operative time as bilateral work needs to be done.  DESCRIPTION: After obtaining full informed written consent, the patient was brought back to the operating room and placed supine upon the operating table.  The patient received IV antibiotics prior to induction.  After obtaining adequate anesthesia, the patient was prepped and draped in the standard fashion for endovascular AAA repair.  We then began by gaining access to both femoral arteries with US guidance with me working on the right and Dr. Delana Meyer working on the left.  The femoral arteries were found to be patent and accessed without difficulty with a needle under ultrasound  guidance without difficulty on each side and permanent images were recorded.  We then placed 2 proglide devices on each side in a pre-close fashion and placed 8 French sheaths. The patient was then given 5000 units of intravenous heparin. The Pigtail catheter was placed into the aorta from the left side. Using this image, we selected a 23 mm proximal 12 cm distal and 18 cm length Main body device.  Over a stiff wire, an 16 French sheath was placed up the right side. The main body was then placed through the 16 French sheath on the right side. A Kumpe catheter was placed up the left side and a magnified image at the renal arteries was performed. The main body was then deployed just below the lowest renal artery. The Kumpe catheter was used to cannulate the contralateral gate without difficulty and successful cannulation was confirmed by twirling the pigtail catheter in the main body. We then placed a stiff wire and a retrograde arteriogram was performed through the left femoral sheath. We upsized to the 12 [place] sheath for the contralateral limb on the left side and a 12 mm diameter by 14 cm length limb was selected and deployed. The main body deployment was then completed. Based off the angiographic findings, extension limbs were not necessary.  There remained significant occlusive disease in the left external iliac artery well below the new stent graft previously placed iliac stents.  This was in the 70% stenosis range.  We elected to treat this with an 8 mm diameter by 6 cm length life star stent deployed from the bottom edge of the stent graft within the previously placed iliac  stents down to the distal left external iliac artery just above the circumflex vessels.  This was postdilated with a 7 mm Lutonix drug-coated balloon with less than 10% residual stenosis.  The right external iliac artery had mild disease that did not require additional stents for treatment.  All junction points and seals zones were  treated with the compliant balloon.  10 mm diameter noncompliant balloons were used and a kissing balloon fashion in the common iliac arteries within the stent graft the pigtail catheter was then replaced and a completion angiogram was performed.  No endoleak was detected on completion angiography. The renal arteries were found to be widely patent.  The hypogastric arteries were chronically occluded and had been seen to have no flow on the initial images. At this point we elected to terminate the procedure. We secured the pro glide devices for hemostasis on the femoral arteries. The skin incision was closed with a 4-0 Monocryl. Dermabond and pressure dressing were placed. The patient was taken to the recovery room in stable condition having tolerated the procedure well.  COMPLICATIONS: none  CONDITION: stable  Leotis Pain  05/18/2019, 10:53 AM   This note was created with Dragon Medical transcription system. Any errors in dictation are purely unintentional.

## 2019-05-18 NOTE — Anesthesia Preprocedure Evaluation (Signed)
Anesthesia Evaluation  Patient identified by MRN, date of birth, ID band Patient awake    Reviewed: Allergy & Precautions, NPO status , Patient's Chart, lab work & pertinent test results, reviewed documented beta blocker date and time   Airway Mallampati: III       Dental  (+) Upper Dentures, Lower Dentures   Pulmonary shortness of breath, sleep apnea, Continuous Positive Airway Pressure Ventilation and Oxygen sleep apnea , COPD, former smoker,    Pulmonary exam normal        Cardiovascular hypertension, Pt. on medications and Pt. on home beta blockers + CAD and + Peripheral Vascular Disease  Normal cardiovascular exam+ dysrhythmias Atrial Fibrillation      Neuro/Psych PSYCHIATRIC DISORDERS Anxiety Dementia TIA Neuromuscular disease    GI/Hepatic Neg liver ROS, hiatal hernia, GERD  ,  Endo/Other  Hypothyroidism   Renal/GU negative Renal ROS     Musculoskeletal  (+) Arthritis , Osteoarthritis,    Abdominal   Peds  Hematology  (+) anemia ,   Anesthesia Other Findings Sleep apnea. Uses CPAP occasionally. Takes it off at night frequently. O2 at 2L at night only. RBBB for years. A fib during exercise, otherwise NSR. Echo few years ago OK.  Reproductive/Obstetrics                             Anesthesia Physical  Anesthesia Plan  ASA: III  Anesthesia Plan: General   Post-op Pain Management:    Induction: Intravenous  PONV Risk Score and Plan:   Airway Management Planned: Oral ETT  Additional Equipment:   Intra-op Plan:   Post-operative Plan: Extubation in OR  Informed Consent: I have reviewed the patients History and Physical, chart, labs and discussed the procedure including the risks, benefits and alternatives for the proposed anesthesia with the patient or authorized representative who has indicated his/her understanding and acceptance.       Plan Discussed with: CRNA and  Surgeon  Anesthesia Plan Comments:         Anesthesia Quick Evaluation

## 2019-05-18 NOTE — Op Note (Signed)
OPERATIVE NOTE   PROCEDURE: 1. US guidance for vascular access, bilateral femoral arteries 2. Catheter placement into aorta from bilateral femoral approaches 3. Placement of a C3 23 x 12 x 18 Gore Excluder Endoprosthesis main body with a 12 x 14 contralateral limb 4. Percutaneous transluminal angioplasty and stent placement left external iliac artery to 7 mm 5. ProGlide closure devices bilateral femoral arteries  PRE-OPERATIVE DIAGNOSIS: AAA; atherosclerotic occlusive disease bilateral lower extremities with lifestyle limiting claudication and history of previous iliac stenting  POST-OPERATIVE DIAGNOSIS: same  SURGEON: Hortencia Pilar, MD and Leotis Pain, MD - Co-surgeons  ANESTHESIA: general  ESTIMATED BLOOD LOSS: 50 cc  FINDING(S): 1.  AAA; greater than 80% stenosis in the distal left external iliac artery  SPECIMEN(S):  none  INDICATIONS:   [name] is a 85 y.o. y.o. male who presents with a 5 cm abdominal aortic aneurysm associated with severe atherosclerotic occlusive disease.  He is complaining of lifestyle limiting claudication left greater than right.  Preoperative evaluation demonstrates an aneurysm that is suitable for endovascular repair.  The risks and benefits as well as the alternatives for aneurysm repair were reviewed hemodynamically significant atherosclerotic occlusive disease within the aorta iliac system will also be treated and this was discussed.  All questions have been answered and the patient agrees to proceed.  DESCRIPTION: After obtaining full informed written consent, the patient was brought back to the operating room and placed supine upon the operating table.  The patient received IV antibiotics prior to induction.  After obtaining adequate anesthesia, the patient was prepped and draped in the standard fashion for endovascular AAA repair.   Co-surgeons are required because this is a complex bilateral procedure with work being performed  simultaneously from both the right femoral and left femoral approach.  This also expedites the procedure making a shorter operative time reducing complications and improving patient safety.  We then began by gaining access to both femoral arteries with US guidance with me working on the patient's left and Dr. Lucky Cowboy working on the patient's right.  The femoral arteries were found to be patent and accessed without difficulty with a needle under ultrasound guidance without difficulty on each side and permanent images were recorded.  We then placed 2 proglide devices on each side in a pre-close fashion and placed 8 French sheaths.  The patient was then given 5000 units of intravenous heparin.   The Pigtail catheter was placed into the aorta from the right side. Using this image, we selected a 23 x 12 x 18 Main body device.  Over a stiff wire, an 54 French sheath was placed. The main body was then placed through the 18 French sheath. A Kumpe catheter was placed up the left side and a magnified image at the renal arteries was performed. The main body was then deployed just below the lowest renal artery. The Kumpe catheter was used to cannulate the contralateral gate without difficulty and successful cannulation was confirmed by twirling the pigtail catheter in the main body. We then placed a stiff wire and a retrograde arteriogram was performed through the left femoral sheath. We upsized to the 12 [place] sheath for the contralateral limb and a 12 x 14 limb was selected and deployed. The main body deployment was then completed. Based off the angiographic findings, extension limbs were not necessary.    The aortic bifurcation was then treated with simultaneous 10 mm balloon inflations as there was hemodynamically significant disease at the ostia of  both common iliacs.  Inflations were to 6 to 8 atm for approximately 1 minute.  Next all junction points and seals zones were treated with the compliant balloon.   The  pigtail catheter was then replaced and a completion angiogram was performed.   No endoleak was detected on completion angiography. The renal arteries were found to be widely patent.    However imaging did demonstrate a greater than 80% stenosis in the distal left external iliac artery.  Therefore a 8 mm x 60 mm life star stent was deployed across this lesion and postdilated with a 7 mm x 60 mm Lutonix drug-eluting balloon.  Follow-up imaging demonstrated less than 10% residual stenosis.  At this point we elected to terminate the procedure. We secured the pro glide devices for hemostasis on the femoral arteries. The skin incision was closed with a 4-0 Monocryl. Dermabond and pressure dressing were placed. The patient was taken to the recovery room in stable condition having tolerated the procedure well.  COMPLICATIONS: none  CONDITION: stable  Hortencia Pilar  05/18/2019, 1:42 PM

## 2019-05-19 LAB — CBC
HCT: 36.1 % — ABNORMAL LOW (ref 39.0–52.0)
Hemoglobin: 12 g/dL — ABNORMAL LOW (ref 13.0–17.0)
MCH: 31.2 pg (ref 26.0–34.0)
MCHC: 33.2 g/dL (ref 30.0–36.0)
MCV: 93.8 fL (ref 80.0–100.0)
Platelets: 141 10*3/uL — ABNORMAL LOW (ref 150–400)
RBC: 3.85 MIL/uL — ABNORMAL LOW (ref 4.22–5.81)
RDW: 15.1 % (ref 11.5–15.5)
WBC: 14.9 10*3/uL — ABNORMAL HIGH (ref 4.0–10.5)
nRBC: 0 % (ref 0.0–0.2)

## 2019-05-19 LAB — BASIC METABOLIC PANEL
Anion gap: 10 (ref 5–15)
BUN: 12 mg/dL (ref 8–23)
CO2: 21 mmol/L — ABNORMAL LOW (ref 22–32)
Calcium: 9 mg/dL (ref 8.9–10.3)
Chloride: 111 mmol/L (ref 98–111)
Creatinine, Ser: 0.89 mg/dL (ref 0.61–1.24)
GFR calc Af Amer: >60 mL/min (ref >60)
GFR calc non Af Amer: >60 mL/min (ref >60)
Glucose, Bld: 118 mg/dL — ABNORMAL HIGH (ref 70–99)
Potassium: 3.7 mmol/L (ref 3.5–5.1)
Sodium: 142 mmol/L (ref 135–145)

## 2019-05-19 MED ORDER — ASPIRIN 81 MG PO TBEC: 81.0000 mg | DELAYED_RELEASE_TABLET | Freq: Every day | ORAL | Status: DC

## 2019-05-19 MED ORDER — FAMOTIDINE 20 MG PO TABS: 20.0000 mg | ORAL_TABLET | Freq: Every day | ORAL | Status: DC

## 2019-05-19 NOTE — Progress Notes (Signed)
Pt confused, agitated, pulling @ lines / other items, got OOB / high risk for fall; refused VS & monitoring of PAD sites ; Safety sitter requested.  At 0300 pt removed Rt. Groin PAD with  dressing; no bleeding, no hematoma @ the site. Safety sitter @ bed side. Will continue to monitor pt closely.

## 2019-05-19 NOTE — Progress Notes (Signed)
D/C home with significant other after dressed, ivs out and discharge teaching done.

## 2019-05-19 NOTE — Anesthesia Postprocedure Evaluation (Signed)
Anesthesia Post Note  Patient: [name]  Procedure(s) Performed: ABDOMINAL AORTIC ENDOVASCULAR STENT GRAFT (Bilateral )  Patient location during evaluation: SICU Anesthesia Type: General Level of consciousness: awake and alert Pain management: pain level controlled Vital Signs Assessment: post-procedure vital signs reviewed and stable Respiratory status: spontaneous breathing and respiratory function stable Cardiovascular status: stable Postop Assessment: no apparent nausea or vomiting Anesthetic complications: no     Last Vitals:  Vitals:   05/19/19 0330 05/19/19 0630  BP: (!) 152/91   Pulse:  100  Resp:  (!) 9  Temp:    SpO2:  95%    Last Pain:  Vitals:   05/19/19 0000  TempSrc: Oral  PainSc:                  Caryl Asp

## 2019-05-19 NOTE — Discharge Instructions (Signed)
You may shower as of Friday. No heavy lifting greater than 10 pounds

## 2019-05-19 NOTE — TOC Transition Note (Signed)
Transition of Care [facility]) - CM/SW Discharge Note   Patient Details  Name: [name] MRN: [id number] Date of Birth: [birth year]  [facility] CM/SW Contact:  Candie Chroman, LCSW Phone Number: 05/19/2019, 1:27 PM   Clinical Narrative: Readmission prevention screen complete. Patient not fully oriented. His significant other is at bedside. CSW introduced role and explained that discharge planning would be discussed. Patient has personal care services through [facility] five days a week from 7:30 am-12:00 pm. He uses a cane and walker at home. His PCP is Dr. Harlan Stains at [facility]. His pharmacy is [facility]. No issues affording medications. His significant other drives him to appts, the pharmacy, etc. Patient has orders to discharge home today. No further concerns. CSW signing off.    Final next level of care: [facility](Personal care services.) Barriers to Discharge: Barriers Resolved   Patient Goals and CMS Choice Patient states their goals for this hospitalization and ongoing recovery are:: Patient not fully oriented.   Choice offered to / list presented to : NA  Discharge Placement                       Discharge Plan and Services     Post Acute Care Choice: NA          DME Arranged: N/A         HH Arranged: NA          Social Determinants of Health (SDOH) Interventions     Readmission Risk Interventions Readmission Risk Prevention Plan 05/19/2019  Transportation Screening Complete  HRI or [place] Complete  Social Work Consult for [place] Planning/Counseling Complete  Palliative Care Screening Not Applicable  Medication Review [employer]) Complete  Some recent data might be hidden

## 2019-05-19 NOTE — Discharge Summary (Signed)
[facility]    Discharge Summary  Patient ID:  [name] MRN: [id number] DOB/AGE: [birth year] 85 y.o.  Admit date: 05/18/2019 Discharge date: 05/19/2019 Date of Surgery: 05/18/2019 Surgeon: Surgeon(s): Dew, Erskine Squibb, MD Schnier, Dolores Lory, MD  Admission Diagnosis: AAA  Discharge Diagnoses:  AAA  Secondary Diagnoses: Past Medical History:  Diagnosis Date  . AAA (abdominal aortic aneurysm) ([place])   . Arthritis   . Atherosclerotic heart disease of native coronary artery without angina pectoris 1998   - s/p CABG, occluded SVG-D1; patent LIMA-LAD, SVG-OM, SVG- RCA  . Basal cell carcinoma of eyelid 01/03/2013   2019 - R side of Nose  . Benign neoplasm of ascending colon   . Benign neoplasm of descending colon   . Cancer [facility])    Skin cancer  . Colon polyps   . COPD (chronic obstructive pulmonary disease) ([place])   . Coronary artery disease   . Dementia ([place])   . Dysrhythmia    atrial fib  . Falls frequently   . GERD (gastroesophageal reflux disease)   . Hemorrhoid 02/10/2015  . History of hiatal hernia   . History of kidney stones   . HOH (hard of hearing)   . Hypertension   . Memory loss   . Osteoporosis   . PAF (paroxysmal atrial fibrillation) ([place])   . Prostate cancer ([place])   . Prostate disorder   . Sciatic pain    Chronic  . Sleep apnea    cpap  . Temporary cerebral vascular dysfunction 02/10/2015   Had negative work-up.  July, 2012.  No medication changes, had forgot them that day, got dehydrated.   . Urinary incontinence    Procedure(s): ABDOMINAL AORTIC ENDOVASCULAR STENT GRAFT  Discharged Condition: good  HPI / Hospital Course:  [name] is a 85 y.o. male is S/P: Procedure(s): ABDOMINAL AORTIC ENDOVASCULAR STENT GRAFT  [name] is a 85 y.o. male who presents with a 5cm abdominal aortic aneurysm as well as significant iliac occlusive disease and claudication symptoms. The anatomy was suitable for  endovascular repair. On 05/19/19, the patient underwent: 1. US guidance for vascular access, bilateral femoral arteries 2. Catheter placement into aorta from bilateral femoral approaches 3. Placement of a 23 mm proximal, 18 cm length Gore Excluder Endoprosthesis main body right with a 12 mm x 14 cm left contralateral limb 4. Placement of a left external iliac artery stent for occlusive disease using an 8 mm diameter by 6 cm length stent postdilated with a 7 mm diameter Lutonix drug-coated balloon 5. ProGlide closure devices bilateral femoral arteries  He tolerated the procedure well was transferred from the recovery room to the ICU for observation overnight.  Labs of surgery was unremarkable.  Postop day #1 the patient's diet was advanced, his Foley was removed and he was urinating independently, his pain was controlled to the use of p.o. pain medication and he was ambulating independently.  Upon discharge the patient had stable vital signs and was afebrile.  Extubated: POD # 0  Physical exam:  A&Ox3, NAD CV: Afib Pulm: CTA Bilaterally Abdomen: Soft, Non-tender, Non-distended (+) Bowel sounds Right Groin: Access site clean, dry and intact. Dermabond intact.  Left Groin: Access site clean, dry and intact. Dermabond intact. Extremity: Warm, Non-tender warm distally to toes   Labs as below  Complications:none  Consults: None  Significant Diagnostic Studies: CBC Lab Results  Component Value Date   WBC 14.9 (H) 05/19/2019   HGB 12.0 (L) 05/19/2019  HCT 36.1 (L) 05/19/2019   MCV 93.8 05/19/2019   PLT 141 (L) 05/19/2019    BMET    Component Value Date/Time   NA 142 05/19/2019 0552   NA 146 (H) 10/14/2018 0813   NA 142 09/28/2014 0626   K 3.7 05/19/2019 0552   K 3.9 09/28/2014 0626   CL 111 05/19/2019 0552   CL 110 (H) 09/28/2014 0626   CO2 21 (L) 05/19/2019 0552   CO2 26 09/28/2014 0626   GLUCOSE 118 (H) 05/19/2019 0552   GLUCOSE 100 (H) 09/28/2014 0626   BUN 12 05/19/2019  0552   BUN 13 10/14/2018 0813   BUN 20 (H) 09/28/2014 0626   CREATININE 0.89 05/19/2019 0552   CREATININE 1.29 (H) 07/10/2017 1645   CALCIUM 9.0 05/19/2019 0552   CALCIUM 8.9 09/28/2014 0626   GFRNONAA >60 05/19/2019 0552   GFRNONAA 52 (L) 07/10/2017 1645   GFRAA >60 05/19/2019 0552   GFRAA 60 07/10/2017 1645   COAG Lab Results  Component Value Date   INR 1.1 05/18/2019   INR 1.3 (H) 05/12/2019   INR 1.3 (H) 01/06/2019     Disposition:  Discharge to :Home  Allergies as of 05/19/2019      Reactions   Clonazepam Other (See Comments)   Altered mental status   Codeine Other (See Comments)   Altered mental status.   Niacin And Related Other (See Comments)   Flushing of skin   Tramadol    Other reaction(s): Hallucination   Hydrocodone    confusion   Oxycodone Other (See Comments)   confusion confusion      Medication List    TAKE these medications   acetaminophen 500 MG tablet Commonly known as: TYLENOL Take 500-1,000 mg by mouth every 6 (six) hours as needed (for pain.).   Advair Diskus 250-50 MCG/DOSE Aepb Generic drug: Fluticasone-Salmeterol INHALE ONE PUFF BY MOUTH TWICE DAILY AS DIRECTED (RINSE MOUTH AFTER USE) What changed: See the new instructions.   aspirin 81 MG EC tablet Take 1 tablet (81 mg total) by mouth daily at 6 (six) AM. Start taking on: May 20, 2019   donepezil 10 MG tablet Commonly known as: ARICEPT Take 10 mg by mouth at bedtime.   Eliquis 5 MG Tabs tablet Generic drug: apixaban TAKE 1 TABLET BY MOUTH TWICE DAILY What changed: how much to take   ezetimibe 10 MG tablet Commonly known as: ZETIA Take 1 tablet (10 mg total) by mouth daily. What changed: when to take this   finasteride 5 MG tablet Commonly known as: PROSCAR Take 1 tablet (5 mg total) by mouth daily.   fluticasone 50 MCG/ACT nasal spray Commonly known as: FLONASE Place 2 sprays into both nostrils daily as needed for allergies. What changed: when to take this    lisinopril 2.5 MG tablet Commonly known as: ZESTRIL TAKE ONE TABLET BY MOUTH TWICE DAILY What changed: when to take this   Magnesium Oxide 500 MG Tabs Take 500 mg by mouth every evening.   metoprolol tartrate 25 MG tablet Commonly known as: LOPRESSOR Take 1 tablet (25 mg total) by mouth 2 (two) times daily. *OFFICE VISIT NEEDED FOR FURTHER REFILLS*   mirabegron ER 50 MG Tb24 tablet Commonly known as: MYRBETRIQ Take 1 tablet (50 mg total) by mouth daily. What changed: when to take this   nitroGLYCERIN 0.4 MG SL tablet Commonly known as: NITROSTAT Place 1 tablet (0.4 mg total) under the tongue every 5 (five) minutes as needed for chest pain.  NON FORMULARY Swish and spit 5 mLs at bedtime. Doxycycline/dexamethasone/nystatin/diphenhydramine compounded mouth elixir   pantoprazole 40 MG tablet Commonly known as: PROTONIX Take 1 tablet (40 mg total) by mouth daily.   predniSONE 10 MG tablet Commonly known as: DELTASONE Take 10-60 mg by mouth See admin instructions. Take 6 tablets (60 mg) by mouth for 2 days, take 5 tablets (50 mg) by mouth for 2 days, take 4 tablets (40 mg) by mouth for 2 days, take 3 tablets (30 mg) by mouth for 2 days, take 2 tablets (20 mg) by mouth for 2 days, then take 1 tablet (10 mg) by mouth for 2 days.   PRESERVISION AREDS 2 PO Take 1 tablet by mouth 2 (two) times daily.   rosuvastatin 20 MG tablet Commonly known as: CRESTOR TAKE ONE TABLET EVERY DAY What changed: when to take this   tamsulosin 0.4 MG Caps capsule Commonly known as: FLOMAX Take 1 capsule (0.4 mg total) by mouth daily. What changed: when to take this   Vitamin B-12 500 MCG Subl Place 500 mcg under the tongue daily.   vitamin C 1000 MG tablet Take 1,000 mg by mouth daily.   VITAMIN D-3 PO Take 1,000 Units by mouth every evening.      Verbal and written Discharge instructions given to the patient. Wound care per Discharge AVS Follow-up Information    Dew, Erskine Squibb, MD Follow  up in 1 week(s).   Specialties: Vascular Surgery, Radiology, Interventional Cardiology Why: Can see Arna Medici. Postop incision check.  Contact information: [place] [address] [phone]          Signed: Sela Hua, PA-C  05/19/2019, 12:51 PM

## 2019-05-20 ENCOUNTER — Other Ambulatory Visit (INDEPENDENT_AMBULATORY_CARE_PROVIDER_SITE_OTHER): Payer: Self-pay | Admitting: Nurse Practitioner

## 2019-05-20 ENCOUNTER — Telehealth (INDEPENDENT_AMBULATORY_CARE_PROVIDER_SITE_OTHER): Payer: Self-pay | Admitting: Vascular Surgery

## 2019-05-20 ENCOUNTER — Telehealth (INDEPENDENT_AMBULATORY_CARE_PROVIDER_SITE_OTHER): Payer: Self-pay

## 2019-05-20 NOTE — Telephone Encounter (Signed)
Spoke with Arna Medici- she advised that this temp is unlikely related to the stent that was just put in but may be the start of a UTI. Advised to start patient on Cipro. Stated if the wound started with smelly/abnormal drainage to contact our office.  Cipro XR 500mg  1 PO QD x 5days called into the [facility] is aware of this and verbalized understanding. AS< CMA

## 2019-05-20 NOTE — Telephone Encounter (Signed)
[name] from [name] left a message stating that they do not have Cipro XR 500mg  and was seeing if the order could be change to regular Cipro.I spoke with Eulogio Ditch NP and advise that it is fine to change the prescription to 250mg  BID x 5 days and pharmacy has been made aware

## 2019-05-26 ENCOUNTER — Ambulatory Visit: Payer: Medicare Other | Admitting: Radiation Oncology

## 2019-05-26 ENCOUNTER — Other Ambulatory Visit: Payer: Self-pay

## 2019-05-26 ENCOUNTER — Ambulatory Visit (INDEPENDENT_AMBULATORY_CARE_PROVIDER_SITE_OTHER): Payer: Medicare Other | Admitting: Nurse Practitioner

## 2019-05-26 ENCOUNTER — Encounter (INDEPENDENT_AMBULATORY_CARE_PROVIDER_SITE_OTHER): Payer: Self-pay | Admitting: Nurse Practitioner

## 2019-05-26 VITALS — BP 126/82 | HR 82 | Resp 12 | Ht 72.0 in | Wt 189.0 lb

## 2019-05-26 DIAGNOSIS — I714 Abdominal aortic aneurysm, without rupture, unspecified: Secondary | ICD-10-CM

## 2019-05-26 DIAGNOSIS — J432 Centrilobular emphysema: Secondary | ICD-10-CM

## 2019-05-26 DIAGNOSIS — I10 Essential (primary) hypertension: Secondary | ICD-10-CM

## 2019-05-28 ENCOUNTER — Other Ambulatory Visit: Payer: Self-pay | Admitting: Family Medicine

## 2019-05-28 DIAGNOSIS — K219 Gastro-esophageal reflux disease without esophagitis: Secondary | ICD-10-CM

## 2019-05-31 ENCOUNTER — Encounter (INDEPENDENT_AMBULATORY_CARE_PROVIDER_SITE_OTHER): Payer: Self-pay | Admitting: Nurse Practitioner

## 2019-05-31 NOTE — Progress Notes (Signed)
SUBJECTIVE:  Patient ID: [name], male    DOB: February 12, 1934, 85 y.o.   MRN: [id number] Chief Complaint  Patient presents with   Follow-up    HPI  [name] is a 85 y.o. male the presents today after abdominal aortic aneurysm repair on 05/18/2019.  Today the bilateral groin sites are nearly healed.  Overall the patient states that he is doing well however he is still having some issues with balance as well as weakness following his procedure.  The patient significant other is currently with the patient and she has some concern for memory issues.  However she does note that the patient has been having these memory issues for well over 6 months or so.  He currently does follow with neurology and is currently taking Aricept.  Otherwise the patient is doing well.  He denies any fever, chills, nausea, vomiting or diarrhea.  He denies any chest pain or shortness of breath.  Past Medical History:  Diagnosis Date   AAA (abdominal aortic aneurysm) ([place])    Arthritis    Atherosclerotic heart disease of native coronary artery without angina pectoris 1998   - s/p CABG, occluded SVG-D1; patent LIMA-LAD, SVG-OM, SVG- RCA   Basal cell carcinoma of eyelid 01/03/2013   2019 - R side of Nose   Benign neoplasm of ascending colon    Benign neoplasm of descending colon    Cancer ([place])    Skin cancer   Colon polyps    COPD (chronic obstructive pulmonary disease) (HCC)    Coronary artery disease    Dementia (HCC)    Dysrhythmia    atrial fib   Falls frequently    GERD (gastroesophageal reflux disease)    Hemorrhoid 02/10/2015   History of hiatal hernia    History of kidney stones    HOH (hard of hearing)    Hypertension    Memory loss    Osteoporosis    PAF (paroxysmal atrial fibrillation) (HCC)    Prostate cancer ([place])    Prostate disorder    Sciatic pain    Chronic   Sleep apnea    cpap   Temporary cerebral vascular dysfunction 02/10/2015   Had  negative work-up.  July, 2012.  No medication changes, had forgot them that day, got dehydrated.    Urinary incontinence     Past Surgical History:  Procedure Laterality Date   ABDOMINAL AORTIC ENDOVASCULAR STENT GRAFT Bilateral 05/18/2019   Procedure: ABDOMINAL AORTIC ENDOVASCULAR STENT GRAFT;  Surgeon: Algernon Huxley, MD;  Location: [facility] ORS;  Service: Vascular;  Laterality: Bilateral;   ANKLE SURGERY     Arch aortogram and carotid aortogram  06/02/2006   Dr Oneida Alar did surgery   BASAL CELL CARCINOMA EXCISION  04/2016   Dermatology   CARDIAC CATHETERIZATION  01/30/1998    (Dr. Linard Millers): Native LAD & mRCA CTO. 90% D1. Mod Cx. SVG-D1 CTO. SVG-RCA & SVG-OM along with LIMA-LAD patent;  LV NORMAL Fxn   CAROTID ENDARTERECTOMY Left Oct. 29, 2007   Dr. Oneida Alar:   CATARACT EXTRACTION W/PHACO Right 03/05/2017   Procedure: CATARACT EXTRACTION PHACO AND INTRAOCULAR LENS PLACEMENT ([place]);  Surgeon: Leandrew Koyanagi, MD;  Location: [facility];  Service: Ophthalmology;  Laterality: Right;  [place] 00:58.5 AP% 10.6 CDE 6.20 Fluid Pack lot # [id number]   CATARACT EXTRACTION W/PHACO Left 04/15/2017   Procedure: CATARACT EXTRACTION PHACO AND INTRAOCULAR LENS PLACEMENT ([place])  Left;  Surgeon: Leandrew Koyanagi, MD;  Location: [place];  Service: Ophthalmology;  Laterality: Left;   COLONOSCOPY WITH PROPOFOL N/A 09/15/2017   Procedure: COLONOSCOPY WITH PROPOFOL;  Surgeon: Lucilla Lame, MD;  Location: [facility] ENDOSCOPY;  Service: Endoscopy;  Laterality: N/A;   COLONOSCOPY WITH PROPOFOL N/A 10/26/2018   Procedure: COLONOSCOPY WITH PROPOFOL;  Surgeon: Lucilla Lame, MD;  Location: [facility] ENDOSCOPY;  Service: Endoscopy;  Laterality: N/A;   CORONARY ARTERY BYPASS GRAFT  1998   LIMA-LAD, SVG-OM, SVG-RPDA, SVG-DIAG   CPET / MET  12/2012   Mild chronotropic incompetence - read 82% of predicted; also reduced effort; peak VO2 15.7 / 75% (did not reach Max effort) -- suggested ischemic response in last 1.5  minutes of exercise. Normal pulmonary function on PFTs but poor response to   DOPPLER ECHOCARDIOGRAPHY  11/20/2010   EF =>55%; LV norm mild aortic scelorosis   ENDOVASCULAR REPAIR/STENT GRAFT N/A 05/18/2019   Procedure: ENDOVASCULAR REPAIR/STENT GRAFT;  Surgeon: Algernon Huxley, MD;  Location: [place] CV LAB;  Service: Cardiovascular;  Laterality: N/A;   ESOPHAGOGASTRODUODENOSCOPY (EGD) WITH PROPOFOL N/A 09/15/2017   Procedure: ESOPHAGOGASTRODUODENOSCOPY (EGD) WITH PROPOFOL;  Surgeon: Lucilla Lame, MD;  Location: [facility] ENDOSCOPY;  Service: Endoscopy;  Laterality: N/A;   FRACTURE SURGERY Right 03/19/2015   wrist   HIATAL HERNIA REPAIR     ILIAC ARTERY STENT  12/15/2005   PTA and direct stenting rgt and lft common iliac arteries   KYPHOPLASTY N/A 04/05/2019   Procedure: KYPHOPLASTY T12, L3, L4 L5;  Surgeon: Hessie Knows, MD;  Location: [facility];  Service: Orthopedics;  Laterality: N/A;   NM GATED MYOVIEW ([place] [facility])  12/2017   Low Risk - no ischemia or infarct.  EF > 65%. no RWMA   NM MYOVIEW ([facility])  11/11/2010; February 2016   a) TM STRESS----NORMAL PERFUSION ,EF 68%; b) EF 50-55%. Normal LV function. No significant ischemia or infarction.   OPEN REDUCTION INTERNAL FIXATION (ORIF) DISTAL RADIAL FRACTURE Left 01/13/2019   Procedure: OPEN REDUCTION INTERNAL FIXATION (ORIF) DISTAL  RADIAL FRACTURE - LEFT - SLEEP APNEA;  Surgeon: Hessie Knows, MD;  Location: [facility] ORS;  Service: Orthopedics;  Laterality: Left;   ORIF ELBOW FRACTURE Right 01/01/2017   Procedure: OPEN REDUCTION INTERNAL FIXATION (ORIF) ELBOW/OLECRANON FRACTURE;  Surgeon: Hessie Knows, MD;  Location: [facility] ORS;  Service: Orthopedics;  Laterality: Right;   ORIF WRIST FRACTURE Right 03/19/2015   Procedure: OPEN REDUCTION INTERNAL FIXATION (ORIF) WRIST FRACTURE;  Surgeon: Hessie Knows, MD;  Location: [facility] OR[facility];  Service: Orthopedics;  Laterality: Right;   THORACIC AORTA - CAROTID ANGIOGRAM  October 2007   Dr. 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 Dilated left atrium.    Social History   Socioeconomic History   Marital status: Widowed    Spouse name: Not on file   Number of children: Not on file   Years of education: Not on file   Highest education level: Not on file  Occupational History   Not on file  Social Needs   Financial resource strain: Not on file   Food insecurity    Worry: Not on file    Inability: Not on file   Transportation needs    Medical: Not on file    Non-medical: Not on file  Tobacco Use   Smoking status: Former Smoker    Types: Cigarettes    Quit date: 08/04/1997    Years since quitting: 21.8   Smokeless  tobacco: Never Used  Substance and Sexual Activity   Alcohol use: No   Drug use: No   Sexual activity: Not Currently    Birth control/protection: None  Lifestyle   Physical activity    Days per week: Not on file    Minutes per session: Not on file   Stress: Not on file  Relationships   Social connections    Talks on phone: Not on file    Gets together: Not on file    Attends religious service: Not on file    Active member of club or organization: Not on file    Attends meetings of clubs or organizations: Not on file    Relationship status: Not on file   Intimate partner violence    Fear of current or ex partner: Not on file    Emotionally abused: Not on file    Physically abused: Not on file    Forced sexual activity: Not on file  Other Topics Concern   Not on file  Social History Narrative   Resides at [facility] since 06/2018   He is a widowed father of one, grandfather of 43 with 3 stepchildren.    Now accompanied by his long-term significant other.      Currently undergoing physical therapy, Occupational Therapy treatments.      He has changed his CODE STATUS to DNR.    Family  History  Problem Relation Age of Onset   Ulcers Mother        Peptic   Dementia Mother    Alcohol abuse Father     Allergies  Allergen Reactions   Clonazepam Other (See Comments)    Altered mental status   Codeine Other (See Comments)    Altered mental status.   Niacin And Related Other (See Comments)    Flushing of skin   Tramadol     Other reaction(s): Hallucination   Hydrocodone     confusion   Oxycodone Other (See Comments)    confusion confusion     Review of Systems   Review of Systems: Negative Unless Checked Constitutional: '[]' Weight loss  '[]' Fever  '[]' Chills Cardiac: '[]' Chest pain   '[]'  Atrial Fibrillation  '[]' Palpitations   '[]' Shortness of breath when laying flat   '[]' Shortness of breath with exertion. '[]' Shortness of breath at rest Vascular:  '[]' Pain in legs with walking   '[]' Pain in legs with standing '[]' Pain in legs when laying flat   '[]' Claudication    '[]' Pain in feet when laying flat    '[]' History of DVT   '[]' Phlebitis   '[]' Swelling in legs   '[]' Varicose veins   '[]' Non-healing ulcers Pulmonary:   '[]' Uses home oxygen   '[]' Productive cough   '[]' Hemoptysis   '[]' Wheeze  '[x]' COPD   '[]' Asthma Neurologic:  '[]' Dizziness   '[]' Seizures  '[]' Blackouts '[]' History of stroke   '[]' History of TIA  '[]' Aphasia   '[]' Temporary Blindness   '[]' Weakness or numbness in arm   '[x]' Weakness or numbness in leg Musculoskeletal:   '[]' Joint swelling   '[]' Joint pain   '[]' Low back pain  '[]'  History of Knee Replacement '[]' Arthritis '[]' back Surgeries  '[]'  Spinal Stenosis    Hematologic:  '[]' Easy bruising  '[]' Easy bleeding   '[]' Hypercoagulable state   '[]' Anemic Gastrointestinal:  '[]' Diarrhea   '[]' Vomiting  '[]' Gastroesophageal reflux/heartburn   '[]' Difficulty swallowing. '[]' Abdominal pain Genitourinary:  '[]' Chronic kidney disease   '[]' Difficult urination  '[]' Anuric   '[]' Blood in urine '[]' Frequent urination  '[]' Burning with urination   '[]' Hematuria Skin:  '[]' Rashes   '[]' Ulcers '[x]' Wounds Psychological:  '[]'   History of anxiety   '[]'  History of major  depression  '[]'  Memory Difficulties      OBJECTIVE:   Physical Exam  BP 126/82 (BP Location: Left Arm, Patient Position: Sitting, Cuff Size: Large)    Pulse 82    Resp 12    Ht 6' (1.829 m)    Wt 189 lb (85.7 kg)    BMI 25.63 kg/m   Gen: WD/WN, NAD Head: /AT, No temporalis wasting.  Ear/Nose/Throat: Hearing grossly intact, nares w/o erythema or drainage Eyes: PER, EOMI, sclera nonicteric.  Neck: Supple, no masses.  No JVD.  Pulmonary:  Good air movement, no use of accessory muscles.  Cardiac: RRR Vascular:  Bilateral groin puncture sites are clean dry and intact with no bruising or evidence of infection. Vessel Right Left  Radial Palpable Palpable  Dorsalis Pedis Palpable Palpable  Posterior Tibial Palpable Palpable   Gastrointestinal: soft, non-distended. No guarding/no peritoneal signs.  Musculoskeletal: Uses cane for ambulation.  Some issues with balance. No deformity or atrophy.  Neurologic: Pain and light touch intact in extremities.  Symmetrical.  Speech is fluent. Motor exam as listed above. Psychiatric: Judgment intact, Mood & affect appropriate for pt's clinical situation. Dermatologic: No Venous rashes. No Ulcers Noted.  No changes consistent with cellulitis. Lymph : No Cervical lymphadenopathy, no lichenification or skin changes of chronic lymphedema.       ASSESSMENT AND PLAN:  1. AAA (abdominal aortic aneurysm) without rupture (HCC) Overall the patient is doing well following repair for his abdominal aortic aneurysm.  The patient will return in about 6 weeks for [facility] for evaluation of his stent graft.  After speaking with the patient and significant other we discussed the possibility of endoleak's and the need for continued surveillance over her lifetime as weeks may occur and repairs may be necessary if that were to occur.  We will also refer the patient to physical therapy due to his balance issues.  Advised the patient and significant other to follow-up with  neurology for concerns about memory issues to determine if further work-up or medication is warranted. - Ambulatory referral to Physical Therapy - VAS [place] EVAR DUPLEX; Future  2. Essential hypertension Continue antihypertensive medications as already ordered, these medications have been reviewed and there are no changes at this time.   3. Centrilobular emphysema ([place]) Continue pulmonary medications and aerosols as already ordered, these medications have been reviewed and there are no changes at this time.     Current Outpatient Medications on File Prior to Visit  Medication Sig Dispense Refill   acetaminophen (TYLENOL) 500 MG tablet Take 500-1,000 mg by mouth every 6 (six) hours as needed for mild pain or moderate pain.      ADVAIR DISKUS 250-50 MCG/DOSE AEPB INHALE ONE PUFF BY MOUTH TWICE DAILY AS DIRECTED (RINSE MOUTH AFTER USE) (Patient taking differently: Inhale 1 puff into the lungs 2 (two) times a day. ) 60 each 5   Ascorbic Acid (VITAMIN C) 1000 MG tablet Take 1,000 mg by mouth daily.     Cyanocobalamin (VITAMIN B-12) 500 MCG SUBL Place 500 mcg under the tongue daily.      donepezil (ARICEPT) 10 MG tablet Take 10 mg by mouth at bedtime.      ELIQUIS 5 MG TABS tablet TAKE 1 TABLET BY MOUTH TWICE DAILY (Patient taking differently: Take 5 mg by mouth 2 (two) times daily. ) 180 tablet 1   ezetimibe (ZETIA) 10 MG tablet Take 1 tablet (10 mg total) by mouth  daily. (Patient taking differently: Take 10 mg by mouth at bedtime. ) 90 tablet 4   finasteride (PROSCAR) 5 MG tablet Take 1 tablet (5 mg total) by mouth daily. 90 tablet 3   fluticasone (FLONASE) 50 MCG/ACT nasal spray Place 2 sprays into both nostrils daily as needed for allergies. (Patient taking differently: Place 2 sprays into both nostrils daily. ) 16 g 4   lisinopril (PRINIVIL,ZESTRIL) 2.5 MG tablet TAKE ONE TABLET BY MOUTH TWICE DAILY (Patient taking differently: Take 2.5 mg by mouth 2 (two) times a day. ) 180 tablet 4     Magnesium Oxide 500 MG TABS Take 500 mg by mouth every evening.     metoprolol tartrate (LOPRESSOR) 25 MG tablet Take 1 tablet (25 mg total) by mouth 2 (two) times daily. *OFFICE VISIT NEEDED FOR FURTHER REFILLS* 180 tablet 0   mirabegron ER (MYRBETRIQ) 50 MG TB24 tablet Take 1 tablet (50 mg total) by mouth daily. (Patient taking differently: Take 50 mg by mouth every evening. ) 30 tablet 11   Multiple Vitamins-Minerals (PRESERVISION AREDS 2 PO) Take 1 tablet by mouth 2 (two) times daily.      rosuvastatin (CRESTOR) 20 MG tablet TAKE ONE TABLET EVERY DAY (Patient taking differently: Take 20 mg by mouth every evening. ) 90 tablet 4   tamsulosin (FLOMAX) 0.4 MG CAPS capsule Take 1 capsule (0.4 mg total) by mouth daily. (Patient taking differently: Take 0.4 mg by mouth every evening. ) 30 capsule 3   aspirin EC 81 MG EC tablet Take 1 tablet (81 mg total) by mouth daily at 6 (six) AM. (Patient not taking: Reported on 05/26/2019)     nitroGLYCERIN (NITROSTAT) 0.4 MG SL tablet Place 1 tablet (0.4 mg total) under the tongue every 5 (five) minutes as needed for chest pain. 20 tablet 1   NON FORMULARY Swish and spit 5 mLs at bedtime. Doxycycline/dexamethasone/nystatin/diphenhydramine compounded mouth elixir     predniSONE (DELTASONE) 10 MG tablet Take 10-60 mg by mouth See admin instructions. Take 6 tablets (60 mg) by mouth for 2 days, take 5 tablets (50 mg) by mouth for 2 days, take 4 tablets (40 mg) by mouth for 2 days, take 3 tablets (30 mg) by mouth for 2 days, take 2 tablets (20 mg) by mouth for 2 days, then take 1 tablet (10 mg) by mouth for 2 days.     No current facility-administered medications on file prior to visit.     There are no Patient Instructions on file for this visit. No follow-ups on file.   Kris Hartmann, NP  This note was completed with Sales executive.  Any errors are purely unintentional.

## 2019-06-02 ENCOUNTER — Encounter: Payer: Self-pay | Admitting: Urology

## 2019-06-02 ENCOUNTER — Other Ambulatory Visit: Payer: Self-pay

## 2019-06-02 ENCOUNTER — Ambulatory Visit (INDEPENDENT_AMBULATORY_CARE_PROVIDER_SITE_OTHER): Payer: Medicare Other | Admitting: Urology

## 2019-06-02 VITALS — BP 134/74 | HR 69 | Ht 72.0 in | Wt 189.6 lb

## 2019-06-02 DIAGNOSIS — C61 Malignant neoplasm of prostate: Secondary | ICD-10-CM | POA: Diagnosis not present

## 2019-06-02 DIAGNOSIS — N200 Calculus of kidney: Secondary | ICD-10-CM

## 2019-06-02 DIAGNOSIS — N3941 Urge incontinence: Secondary | ICD-10-CM | POA: Diagnosis not present

## 2019-06-02 LAB — MICROSCOPIC EXAMINATION
Bacteria, UA: NONE SEEN
RBC, Urine: NONE SEEN /hpf (ref 0–2)

## 2019-06-02 LAB — URINALYSIS, COMPLETE
Bilirubin, UA: NEGATIVE
Glucose, UA: NEGATIVE
Leukocytes,UA: NEGATIVE
Nitrite, UA: NEGATIVE
RBC, UA: NEGATIVE
Specific Gravity, UA: 1.025 (ref 1.005–1.030)
Urobilinogen, Ur: 0.2 mg/dL (ref 0.2–1.0)
pH, UA: 5.5 (ref 5.0–7.5)

## 2019-06-02 NOTE — Progress Notes (Signed)
06/02/2019 3:02 PM   [name] 10-08-1933 [id number]  Referring provider: [name], [address] [place],  [place] [address]  Chief Complaint  Patient presents with  . 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 male followed for prostate cancer.  He presented to the ED on 04/12/2019 complaining of left lower quadrant abdominal pain.  CT was performed showing a AAA and nonobstructing left renal calculi.  He had nonobstructing left renal calculi on a CT performed at [facility] in 2019.  CT did not show hydronephrosis or ureteral calculi.  His pain had resolved prior to discharge.  He subsequently underwent endovascular aneurysm repair.  He denies gross hematuria.  He does have intermittent voiding symptoms consisting of urinary frequency, urgency, urge incontinence, intermittent urinary stream and dysuria on occasion.  He is on Myrbetriq 50 mg and tamsulosin.   PMH: Past Medical History:  Diagnosis Date  . AAA (abdominal aortic aneurysm) ([place])   . Arthritis   . Atherosclerotic heart disease of native coronary artery without angina pectoris 1998   - s/p CABG, occluded SVG-D1; patent LIMA-LAD, SVG-OM, SVG- RCA  . Basal cell carcinoma of eyelid 01/03/2013   2019 - R side of Nose  . Benign neoplasm of ascending colon   . Benign neoplasm of descending colon   . Cancer [facility])    Skin cancer  . Colon polyps   . COPD (chronic obstructive pulmonary disease) ([place])   . Coronary artery disease   . Dementia ([place])   . Dysrhythmia    atrial fib  . Falls frequently   . GERD (gastroesophageal reflux disease)   . Hemorrhoid 02/10/2015  . History of hiatal hernia   . History of kidney stones   . HOH (hard of hearing)   . Hypertension   . Memory  loss   . Osteoporosis   . PAF (paroxysmal atrial fibrillation) ([place])   . Prostate cancer ([place])   . Prostate disorder   . Sciatic pain    Chronic  . Sleep apnea    cpap  . Temporary cerebral vascular dysfunction 02/10/2015   Had negative work-up.  July, 2012.  No medication changes, had forgot them that day, got dehydrated.   . Urinary incontinence     Surgical History: Past Surgical History:  Procedure Laterality Date  . ABDOMINAL AORTIC ENDOVASCULAR STENT GRAFT Bilateral 05/18/2019   Procedure: ABDOMINAL AORTIC ENDOVASCULAR STENT GRAFT;  Surgeon: Algernon Huxley, MD;  Location: [facility] ORS;  Service: Vascular;  Laterality: Bilateral;  . ANKLE SURGERY    . Arch aortogram and carotid aortogram  06/02/2006   Dr Oneida Alar did surgery  . BASAL CELL CARCINOMA EXCISION  04/2016   Dermatology  . CARDIAC CATHETERIZATION  01/30/1998    (Dr. Linard Millers): Native LAD & mRCA CTO. 90% D1. Mod Cx. SVG-D1 CTO. SVG-RCA & SVG-OM along with LIMA-LAD patent;  LV NORMAL Fxn  . CAROTID ENDARTERECTOMY Left Oct. 29, 2007   Dr. Oneida Alar:  . CATARACT EXTRACTION W/PHACO Right 03/05/2017   Procedure: CATARACT EXTRACTION PHACO AND INTRAOCULAR LENS PLACEMENT (IOC);  Surgeon: Leandrew Koyanagi, MD;  Location: [facility] ORS;  Service: Ophthalmology;  Laterality: Right;  [place] 00:58.5 AP% 10.6 CDE 6.20 Fluid Pack lot # [id number]  . CATARACT EXTRACTION W/PHACO Left 04/15/2017   Procedure: CATARACT EXTRACTION PHACO AND INTRAOCULAR LENS PLACEMENT (  IOC)  Left;  Surgeon: Leandrew Koyanagi, MD;  Location: [place];  Service: Ophthalmology;  Laterality: Left;  . COLONOSCOPY WITH PROPOFOL N/A 09/15/2017   Procedure: COLONOSCOPY WITH PROPOFOL;  Surgeon: Lucilla Lame, MD;  Location: [facility] ENDOSCOPY;  Service: Endoscopy;  Laterality: N/A;  . COLONOSCOPY WITH PROPOFOL N/A 10/26/2018   Procedure: COLONOSCOPY WITH PROPOFOL;  Surgeon: Lucilla Lame, MD;  Location: [facility] ENDOSCOPY;  Service: Endoscopy;  Laterality: N/A;  . CORONARY ARTERY  BYPASS GRAFT  1998   LIMA-LAD, SVG-OM, SVG-RPDA, SVG-DIAG  . CPET / MET  12/2012   Mild chronotropic incompetence - read 82% of predicted; also reduced effort; peak VO2 15.7 / 75% (did not reach Max effort) -- suggested ischemic response in last 1.5 minutes of exercise. Normal pulmonary function on PFTs but poor response to  . DOPPLER ECHOCARDIOGRAPHY  11/20/2010   EF =>55%; LV norm mild aortic scelorosis  . ENDOVASCULAR REPAIR/STENT GRAFT N/A 05/18/2019   Procedure: ENDOVASCULAR REPAIR/STENT GRAFT;  Surgeon: Algernon Huxley, MD;  Location: [place] [facility];  Service: Cardiovascular;  Laterality: N/A;  . ESOPHAGOGASTRODUODENOSCOPY (EGD) WITH PROPOFOL N/A 09/15/2017   Procedure: ESOPHAGOGASTRODUODENOSCOPY (EGD) WITH PROPOFOL;  Surgeon: Lucilla Lame, MD;  Location: [facility] ENDOSCOPY;  Service: Endoscopy;  Laterality: N/A;  . FRACTURE SURGERY Right 03/19/2015   wrist  . HIATAL HERNIA REPAIR    . ILIAC ARTERY STENT  12/15/2005   PTA and direct stenting rgt and lft common iliac arteries  . KYPHOPLASTY N/A 04/05/2019   Procedure: KYPHOPLASTY T12, L3, L4 L5;  Surgeon: Hessie Knows, MD;  Location: [facility] ORS;  Service: Orthopedics;  Laterality: N/A;  . NM GATED MYOVIEW ([place] [facility])  12/2017   Low Risk - no ischemia or infarct.  EF > 65%. no RWMA  . NM MYOVIEW ([place] [facility])  11/11/2010; February 2016   a) TM STRESS----NORMAL PERFUSION ,EF 68%; b) EF 50-55%. Normal LV function. No significant ischemia or infarction.  . OPEN REDUCTION INTERNAL FIXATION (ORIF) DISTAL RADIAL FRACTURE Left 01/13/2019   Procedure: OPEN REDUCTION INTERNAL FIXATION (ORIF) DISTAL  RADIAL FRACTURE - LEFT - SLEEP APNEA;  Surgeon: Hessie Knows, MD;  Location: [facility] ORS;  Service: Orthopedics;  Laterality: Left;  . ORIF ELBOW FRACTURE Right 01/01/2017   Procedure: OPEN REDUCTION INTERNAL FIXATION (ORIF) ELBOW/OLECRANON FRACTURE;  Surgeon: Hessie Knows, MD;  Location: [facility] ORS;  Service: Orthopedics;  Laterality: Right;  . ORIF WRIST FRACTURE Right  03/19/2015   Procedure: OPEN REDUCTION INTERNAL FIXATION (ORIF) WRIST FRACTURE;  Surgeon: Hessie Knows, MD;  Location: [facility] ORS;  Service: Orthopedics;  Laterality: Right;  . THORACIC AORTA - CAROTID ANGIOGRAM  October 2007   Dr. 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 TRANSTHORACIC ECHOCARDIOGRAM  February 2016   [facility]: Normal LV function. Dilated left atrium.    Home Medications:  Allergies as of 06/02/2019      Reactions   Clonazepam Other (See Comments)   Altered mental status   Codeine Other (See Comments)   Altered mental status.   Niacin And Related Other (See Comments)   Flushing of skin   Tramadol    Other reaction(s): Hallucination   Hydrocodone    confusion   Oxycodone Other (See Comments)   confusion confusion      Medication List       Accurate as of June 02, 2019  3:02 PM. If you have any questions, ask your nurse or doctor.  STOP taking these medications   aspirin 81 MG EC tablet Stopped by: Abbie Sons, MD   predniSONE 10 MG tablet Commonly known as: DELTASONE Stopped by: Abbie Sons, MD     TAKE these medications   acetaminophen 500 MG tablet Commonly known as: TYLENOL Take 500-1,000 mg by mouth every 6 (six) hours as needed for mild pain or moderate pain.   Advair Diskus 250-50 MCG/DOSE Aepb Generic drug: Fluticasone-Salmeterol INHALE ONE PUFF BY MOUTH TWICE DAILY AS DIRECTED (RINSE MOUTH AFTER USE) What changed: See the new instructions.   dexamethasone 4 MG tablet Commonly known as: DECADRON   donepezil 10 MG tablet Commonly known as: ARICEPT Take 10 mg by mouth at bedtime.   Eliquis 5 MG Tabs tablet Generic drug: apixaban TAKE 1 TABLET BY MOUTH TWICE DAILY What changed: how much to take   ezetimibe 10 MG tablet Commonly known as: ZETIA Take 1 tablet (10 mg total) by mouth daily. What changed: when to take this   finasteride 5  MG tablet Commonly known as: PROSCAR Take 1 tablet (5 mg total) by mouth daily.   fluticasone 50 MCG/ACT nasal spray Commonly known as: FLONASE Place 2 sprays into both nostrils daily as needed for allergies. What changed: when to take this   lisinopril 2.5 MG tablet Commonly known as: ZESTRIL TAKE ONE TABLET BY MOUTH TWICE DAILY What changed: when to take this   Magnesium Oxide 500 MG Tabs Take 500 mg by mouth every evening.   metoprolol tartrate 25 MG tablet Commonly known as: LOPRESSOR Take 1 tablet (25 mg total) by mouth 2 (two) times daily. *OFFICE VISIT NEEDED FOR FURTHER REFILLS*   mirabegron ER 50 MG Tb24 tablet Commonly known as: MYRBETRIQ Take 1 tablet (50 mg total) by mouth daily. What changed: when to take this   nitroGLYCERIN 0.4 MG SL tablet Commonly known as: NITROSTAT Place 1 tablet (0.4 mg total) under the tongue every 5 (five) minutes as needed for chest pain.   NON FORMULARY Swish and spit 5 mLs at bedtime. Doxycycline/dexamethasone/nystatin/diphenhydramine compounded mouth elixir   pantoprazole 40 MG tablet Commonly known as: PROTONIX TAKE ONE TABLET BY MOUTH EVERY DAY   PRESERVISION AREDS 2 PO Take 1 tablet by mouth 2 (two) times daily.   rosuvastatin 20 MG tablet Commonly known as: CRESTOR TAKE ONE TABLET EVERY DAY What changed: when to take this   tamsulosin 0.4 MG Caps capsule Commonly known as: FLOMAX Take 1 capsule (0.4 mg total) by mouth daily. What changed: when to take this   Vitamin B-12 500 MCG Subl Place 500 mcg under the tongue daily.   vitamin C 1000 MG tablet Take 1,000 mg by mouth daily.       Allergies:  Allergies  Allergen Reactions  . Clonazepam Other (See Comments)    Altered mental status  . Codeine Other (See Comments)    Altered mental status.  . Niacin And Related Other (See Comments)    Flushing of skin  . Tramadol     Other reaction(s): Hallucination  . Hydrocodone     confusion  . Oxycodone Other  (See Comments)    confusion confusion    Family History: Family History  Problem Relation Age of Onset  . Ulcers Mother        Peptic  . Dementia Mother   . Alcohol abuse Father     Social History:  reports that he quit smoking about 21 years ago. His smoking use included cigarettes. He has  never used smokeless tobacco. He reports that he does not drink alcohol or use drugs.  ROS: UROLOGY Frequent Urination?: Yes Hard to postpone urination?: Yes Burning/pain with urination?: Yes Get up at night to urinate?: Yes Leakage of urine?: Yes Urine stream starts and stops?: Yes Trouble starting stream?: No Do you have to strain to urinate?: No Blood in urine?: No Urinary tract infection?: No Sexually transmitted disease?: No Injury to kidneys or bladder?: No Painful intercourse?: No Weak stream?: No Erection problems?: No Penile pain?: No  Gastrointestinal Nausea?: No Vomiting?: No Indigestion/heartburn?: No Diarrhea?: No Constipation?: No  Constitutional Fever: No Night sweats?: No Weight loss?: Yes Fatigue?: No  Skin Skin rash/lesions?: No Itching?: No  Eyes Blurred vision?: No Double vision?: No  Ears/Nose/Throat Sore throat?: No Sinus problems?: No  Hematologic/Lymphatic Swollen glands?: No Easy bruising?: No  Cardiovascular Leg swelling?: No Chest pain?: No  Respiratory Cough?: No Shortness of breath?: No  Endocrine Excessive thirst?: No  Musculoskeletal Back pain?: Yes Joint pain?: No  Neurological Headaches?: No Dizziness?: No  Psychologic Depression?: No Anxiety?: No  Physical Exam: BP 134/74 (BP Location: Left Arm, Patient Position: Sitting, Cuff Size: Normal)   Pulse 69   Ht 6' (1.829 m)   Wt 189 lb 9.6 oz (86 kg)   BMI 25.71 kg/m   Constitutional:  Alert and oriented, No acute distress. HEENT: [place] AT, moist mucus membranes.  Trachea midline, no masses. Cardiovascular: No clubbing, cyanosis, or edema. Respiratory: Normal  respiratory effort, no increased work of breathing. Neurologic: Grossly intact, no focal deficits, moving all 4 extremities. Psychiatric: Normal mood and affect.   Pertinent Imaging: CT personally reviewed  Assessment & Plan:    - Nephrolithiasis Nonobstructing left renal calculi which appear stable from a previous CT 2019.  Would recommend observation and he would instructed to call for development of left flank/left abdominal pain.  Follow-up KUB 1 year.  - Prostate cancer Scheduled for follow-up of April 2021  - Lower urinary tract symptoms Continue Myrbetriq and tamsulosin.  Urinalysis today showed no blood or pyuria.   Abbie Sons, [place] [address] [place], [place] [address] [phone]

## 2019-06-03 ENCOUNTER — Encounter: Payer: Self-pay | Admitting: Urology

## 2019-06-03 ENCOUNTER — Telehealth: Payer: Self-pay

## 2019-06-03 NOTE — Telephone Encounter (Signed)
Called pt informed him of the information below. Pt gave verbal understanding.  

## 2019-06-03 NOTE — Telephone Encounter (Signed)
-----   Message from Abbie Sons, MD sent at 06/03/2019  7:16 AM EDT ----- Urinalysis showed no evidence of infection or blood.

## 2019-06-07 ENCOUNTER — Other Ambulatory Visit: Payer: Self-pay

## 2019-06-07 DIAGNOSIS — C61 Malignant neoplasm of prostate: Secondary | ICD-10-CM

## 2019-06-07 DIAGNOSIS — N3941 Urge incontinence: Secondary | ICD-10-CM

## 2019-06-07 DIAGNOSIS — M47816 Spondylosis without myelopathy or radiculopathy, lumbar region: Secondary | ICD-10-CM | POA: Diagnosis not present

## 2019-06-07 MED ORDER — TAMSULOSIN HCL 0.4 MG PO CAPS: 0.4000 mg | ORAL_CAPSULE | Freq: Every evening | ORAL | 3 refills | Status: DC

## 2019-06-08 ENCOUNTER — Other Ambulatory Visit: Payer: Self-pay

## 2019-06-09 ENCOUNTER — Other Ambulatory Visit: Payer: Self-pay

## 2019-06-09 ENCOUNTER — Ambulatory Visit
Admission: RE | Admit: 2019-06-09 | Discharge: 2019-06-09 | Disposition: A | Discharge status: Home / Self Care | Payer: Medicare Other | Source: Ambulatory Visit | Attending: Radiation Oncology | Admitting: Radiation Oncology

## 2019-06-09 ENCOUNTER — Encounter: Payer: Self-pay | Admitting: Radiation Oncology

## 2019-06-09 VITALS — BP 166/78 | HR 62 | Temp 97.2°F | Resp 16 | Wt 192.6 lb

## 2019-06-09 DIAGNOSIS — R350 Frequency of micturition: Secondary | ICD-10-CM | POA: Diagnosis not present

## 2019-06-09 DIAGNOSIS — Z79899 Other long term (current) drug therapy: Secondary | ICD-10-CM | POA: Insufficient documentation

## 2019-06-09 DIAGNOSIS — C61 Malignant neoplasm of prostate: Secondary | ICD-10-CM | POA: Diagnosis not present

## 2019-06-09 NOTE — Progress Notes (Signed)
Radiation Oncology Follow up Note  Name: [name]   Date:   06/09/2019 MRN:  [id number] DOB: [birth year]    This 85 y.o. male presents to the clinic today for 1 year follow-up status post IMRT radiation therapy for Gleason 8 adenocarcinoma the prostate.  REFERRING PROVIDER: [name], MD  HPI: Patient is an 85 year old male now at 1 year having completed IMRT radiation therapy to his prostate for Gleason 8 adenocarcinoma.  Seen today in routine follow-up he is doing fairly well he has had 3 surgeries since May including wrist fractured vertebrae and an aortic aneurysm.  He states his urinary frequency and urgency is under control he is currently on Flomax finasteride andmybetric.  His most recent PSA is 0.16.[name]  COMPLICATIONS OF TREATMENT: none  FOLLOW UP COMPLIANCE: keeps appointments   PHYSICAL EXAM:  BP (!) 166/78 (BP Location: Left Arm, Patient Position: Sitting)   Pulse 62   Temp (!) 97.2 F (36.2 C) (Tympanic)   Resp 16   Wt 192 lb 9.6 oz (87.4 kg)   BMI 26.12 kg/m  Well-developed well-nourished patient in NAD. HEENT reveals PERLA, EOMI, discs not visualized.  Oral cavity is clear. No oral mucosal lesions are identified. Neck is clear without evidence of cervical or supraclavicular adenopathy. Lungs are clear to A&P. Cardiac examination is essentially unremarkable with regular rate and rhythm without murmur rub or thrill. Abdomen is benign with no organomegaly or masses noted. Motor sensory and DTR levels are equal and symmetric in the upper and lower extremities. Cranial nerves II through XII are grossly intact. Proprioception is intact. No peripheral adenopathy or edema is identified. No motor or sensory levels are noted. Crude visual fields are within normal range.  RADIOLOGY RESULTS: CT scan of abdomen pelvis back in August is reviewed showing 5 x 5 cm saccular aortic aneurysm which has been repaired.  PLAN: Present time patient is doing well with no evidence of  disease under excellent biochemical control of his prostate cancer.  I am pleased with his overall progress.  I have asked to see him back in 1 year for follow-up with a PSA prior to that visit.  Patient knows to call at anytime with any concerns.  I would like to take this opportunity to thank you for allowing me to participate in the care of your patient.Noreene Filbert, MD

## 2019-06-14 ENCOUNTER — Ambulatory Visit (INDEPENDENT_AMBULATORY_CARE_PROVIDER_SITE_OTHER): Payer: Medicare Other | Admitting: Family Medicine

## 2019-06-14 ENCOUNTER — Other Ambulatory Visit: Payer: Self-pay

## 2019-06-14 VITALS — BP 124/66 | HR 62 | Temp 96.8°F | Wt 189.0 lb

## 2019-06-14 DIAGNOSIS — R634 Abnormal weight loss: Secondary | ICD-10-CM | POA: Diagnosis not present

## 2019-06-14 DIAGNOSIS — Z8679 Personal history of other diseases of the circulatory system: Secondary | ICD-10-CM

## 2019-06-14 DIAGNOSIS — Z9889 Other specified postprocedural states: Secondary | ICD-10-CM | POA: Diagnosis not present

## 2019-06-14 DIAGNOSIS — M4856XA Collapsed vertebra, not elsewhere classified, lumbar region, initial encounter for fracture: Secondary | ICD-10-CM | POA: Diagnosis not present

## 2019-06-14 DIAGNOSIS — I2589 Other forms of chronic ischemic heart disease: Secondary | ICD-10-CM | POA: Diagnosis not present

## 2019-06-14 DIAGNOSIS — R630 Anorexia: Secondary | ICD-10-CM | POA: Diagnosis not present

## 2019-06-14 DIAGNOSIS — S62109S Fracture of unspecified carpal bone, unspecified wrist, sequela: Secondary | ICD-10-CM | POA: Diagnosis not present

## 2019-06-14 DIAGNOSIS — Z23 Encounter for immunization: Secondary | ICD-10-CM | POA: Diagnosis not present

## 2019-06-14 NOTE — Progress Notes (Signed)
Patient: [name] Male    DOB: [birth year]   85 y.o.   MRN: [id number] Visit Date: 06/14/2019  Today's Provider: Lelon Huh, MD   Chief Complaint  Patient presents with  . Follow-up    Post-Op   Subjective:     HPI  Patient presents today accompanied with his wife for post-op visit. Patient reports that he had abdominal aorta aneurysm on 05/18/19 which a stent graft was placed. Patient reports today he has since followed up vascular surgeon Dr. Lucky Cowboy on 05/26/19 and is recovering well.  He also had surgery for a fractured wrist in May and kyphoplasty for vertebral compression fracture by Dr. Rudene Christians in August. He is still having some pain in back but is improving.   Allergies  Allergen Reactions  . Clonazepam Other (See Comments)    Altered mental status  . Codeine Other (See Comments)    Altered mental status.  . Niacin And Related Other (See Comments)    Flushing of skin  . Tramadol     Other reaction(s): Hallucination  . Hydrocodone     confusion  . Oxycodone Other (See Comments)    confusion confusion     Current Outpatient Medications:  .  acetaminophen (TYLENOL) 500 MG tablet, Take 500-1,000 mg by mouth every 6 (six) hours as needed for mild pain or moderate pain. , Disp: , Rfl:  .  ADVAIR DISKUS 250-50 MCG/DOSE AEPB, INHALE ONE PUFF BY MOUTH TWICE DAILY AS DIRECTED (RINSE MOUTH AFTER USE) (Patient taking differently: Inhale 1 puff into the lungs 2 (two) times a day. ), Disp: 60 each, Rfl: 5 .  Ascorbic Acid (VITAMIN C) 1000 MG tablet, Take 1,000 mg by mouth daily., Disp: , Rfl:  .  Cyanocobalamin (VITAMIN B-12) 500 MCG SUBL, Place 500 mcg under the tongue daily. , Disp: , Rfl:  .  donepezil (ARICEPT) 10 MG tablet, Take 10 mg by mouth at bedtime. , Disp: , Rfl:  .  ELIQUIS 5 MG TABS tablet, TAKE 1 TABLET BY MOUTH TWICE DAILY (Patient taking differently: Take 5 mg by mouth 2 (two) times daily. ), Disp: 180 tablet, Rfl: 1 .  ezetimibe (ZETIA) 10 MG tablet,  Take 1 tablet (10 mg total) by mouth daily. (Patient taking differently: Take 10 mg by mouth at bedtime. ), Disp: 90 tablet, Rfl: 4 .  finasteride (PROSCAR) 5 MG tablet, Take 1 tablet (5 mg total) by mouth daily., Disp: 90 tablet, Rfl: 3 .  fluticasone (FLONASE) 50 MCG/ACT nasal spray, Place 2 sprays into both nostrils daily as needed for allergies., Disp: 16 g, Rfl: 4 .  lisinopril (PRINIVIL,ZESTRIL) 2.5 MG tablet, TAKE ONE TABLET BY MOUTH TWICE DAILY (Patient taking differently: Take 2.5 mg by mouth 2 (two) times a day. ), Disp: 180 tablet, Rfl: 4 .  Magnesium Oxide 500 MG TABS, Take 500 mg by mouth every evening., Disp: , Rfl:  .  metoprolol tartrate (LOPRESSOR) 25 MG tablet, Take 1 tablet (25 mg total) by mouth 2 (two) times daily. *OFFICE VISIT NEEDED FOR FURTHER REFILLS*, Disp: 180 tablet, Rfl: 0 .  mirabegron ER (MYRBETRIQ) 50 MG TB24 tablet, Take 1 tablet (50 mg total) by mouth daily. (Patient taking differently: Take 50 mg by mouth every evening. ), Disp: 30 tablet, Rfl: 11 .  Multiple Vitamins-Minerals (PRESERVISION AREDS 2 PO), Take 1 tablet by mouth 2 (two) times daily. , Disp: , Rfl:  .  nitroGLYCERIN (NITROSTAT) 0.4 MG SL tablet, Place 1 tablet (  0.4 mg total) under the tongue every 5 (five) minutes as needed for chest pain., Disp: 20 tablet, Rfl: 1 .  NON FORMULARY, Swish and spit 5 mLs at bedtime. Doxycycline/dexamethasone/nystatin/diphenhydramine compounded mouth elixir, Disp: , Rfl:  .  pantoprazole (PROTONIX) 40 MG tablet, TAKE ONE TABLET BY MOUTH EVERY DAY, Disp: 30 tablet, Rfl: 5 .  rosuvastatin (CRESTOR) 20 MG tablet, TAKE ONE TABLET EVERY DAY (Patient taking differently: Take 20 mg by mouth every evening. ), Disp: 90 tablet, Rfl: 4 .  tamsulosin (FLOMAX) 0.4 MG CAPS capsule, Take 1 capsule (0.4 mg total) by mouth every evening., Disp: 90 capsule, Rfl: 3 .  dexamethasone (DECADRON) 4 MG tablet, , Disp: , Rfl:   Review of Systems  Constitutional: Positive for appetite change (wife  reports over the past 6 months) and unexpected weight change.  HENT: Negative.   Respiratory: Negative.   Cardiovascular: Negative.   Gastrointestinal: Negative.   Musculoskeletal: Negative.   Neurological: Negative.     Social History   Tobacco Use  . Smoking status: Former Smoker    Types: Cigarettes    Quit date: 08/04/1997    Years since quitting: 21.8  . Smokeless tobacco: Never Used  Substance Use Topics  . Alcohol use: No      Objective:   BP 124/66   Pulse 62   Temp (!) 96.8 F (36 C) (Oral)   Wt 189 lb (85.7 kg)   SpO2 99%   BMI 25.63 kg/m  Vitals:   06/14/19 1107  BP: 124/66  Pulse: 62  Temp: (!) 96.8 F (36 C)  TempSrc: Oral  SpO2: 99%  Weight: 189 lb (85.7 kg)  Body mass index is 25.63 kg/m.   Physical Exam   General Appearance:    Overweight male in no acute distress  Eyes:    PERRL, conjunctiva/corneas clear, EOM's intact       Lungs:     Clear to auscultation bilaterally, respirations unlabored  Heart:    Normal heart rate. Regular rhythm. No murmurs, rubs, or gallops.   MS:   All extremities are intact.   Neurologic:   Awake, alert, oriented x 3. No apparent focal neurological           defect.          Assessment & Plan     1. Status post abdominal aortic aneurysm (AAA) repair Per dr. Lucky Cowboy. Is recuperating well.   2. Loss of appetite Wt Readings from Last 5 Encounters:  06/14/19 189 lb (85.7 kg)  06/09/19 192 lb 9.6 oz (87.4 kg)  06/02/19 189 lb 9.6 oz (86 kg)  05/26/19 189 lb (85.7 kg)  05/18/19 199 lb 4.7 oz (90.4 kg)   - TSH - T4, free  3. Unexplained weight loss  - CBC with Differential/Platelet - TSH - T4, free - Amylase  4. Closed fracture of wrist, unspecified laterality, sequela Healing well.  - DG Bone Density; Future  5. Collapsed vertebra, not elsewhere classified, lumbar region, initial encounter for fracture [facility])  Per Dr. Rudene Christians.  - DG Bone Density; Future  6. Need for influenza vaccination  - Flu  Vaccine QUAD High Dose(Fluad)   The entirety of the information documented in the History of Present Illness, Review of Systems and Physical Exam were personally obtained by me. Portions of this information were initially documented by Minette Headland, CMA and reviewed by me for thoroughness and accuracy.      Lelon Huh, MD  [facility]

## 2019-06-15 LAB — CBC WITH DIFFERENTIAL/PLATELET
Basophils Absolute: 0 10*3/uL (ref 0.0–0.2)
Basos: 0 %
EOS (ABSOLUTE): 0.1 10*3/uL (ref 0.0–0.4)
Eos: 1 %
Hematocrit: 37.3 % — ABNORMAL LOW (ref 37.5–51.0)
Hemoglobin: 12.3 g/dL — ABNORMAL LOW (ref 13.0–17.7)
Immature Grans (Abs): 0.1 10*3/uL (ref 0.0–0.1)
Immature Granulocytes: 1 %
Lymphocytes Absolute: 1.9 10*3/uL (ref 0.7–3.1)
Lymphs: 19 %
MCH: 31.3 pg (ref 26.6–33.0)
MCHC: 33 g/dL (ref 31.5–35.7)
MCV: 95 fL (ref 79–97)
Monocytes Absolute: 1 10*3/uL — ABNORMAL HIGH (ref 0.1–0.9)
Monocytes: 10 %
Neutrophils Absolute: 6.8 10*3/uL (ref 1.4–7.0)
Neutrophils: 69 %
Platelets: 163 10*3/uL (ref 150–450)
RBC: 3.93 x10E6/uL — ABNORMAL LOW (ref 4.14–5.80)
RDW: 14.1 % (ref 11.6–15.4)
WBC: 10 10*3/uL (ref 3.4–10.8)

## 2019-06-15 LAB — T4, FREE: Free T4: 1.43 ng/dL (ref 0.82–1.77)

## 2019-06-15 LAB — AMYLASE: Amylase: 100 U/L (ref 31–110)

## 2019-06-15 LAB — TSH: TSH: 2.61 u[IU]/mL (ref 0.450–4.500)

## 2019-06-16 DIAGNOSIS — R413 Other amnesia: Secondary | ICD-10-CM | POA: Diagnosis not present

## 2019-06-16 DIAGNOSIS — Z9989 Dependence on other enabling machines and devices: Secondary | ICD-10-CM | POA: Diagnosis not present

## 2019-06-16 DIAGNOSIS — G4733 Obstructive sleep apnea (adult) (pediatric): Secondary | ICD-10-CM | POA: Diagnosis not present

## 2019-06-16 DIAGNOSIS — K117 Disturbances of salivary secretion: Secondary | ICD-10-CM | POA: Diagnosis not present

## 2019-06-17 ENCOUNTER — Ambulatory Visit: Payer: Medicare Other | Attending: Nurse Practitioner

## 2019-06-17 ENCOUNTER — Other Ambulatory Visit: Payer: Self-pay

## 2019-06-17 DIAGNOSIS — M6281 Muscle weakness (generalized): Secondary | ICD-10-CM | POA: Insufficient documentation

## 2019-06-17 DIAGNOSIS — R2681 Unsteadiness on feet: Secondary | ICD-10-CM | POA: Insufficient documentation

## 2019-06-17 DIAGNOSIS — R262 Difficulty in walking, not elsewhere classified: Secondary | ICD-10-CM | POA: Insufficient documentation

## 2019-06-17 DIAGNOSIS — R2689 Other abnormalities of gait and mobility: Secondary | ICD-10-CM | POA: Insufficient documentation

## 2019-06-17 NOTE — Patient Instructions (Signed)
Access Code: [email or web address]  URL: [email or web address]  Date: 06/17/2019  Prepared by: Janna Arch   Exercises Standing March with Counter Support - 10 reps - 2 sets - 5 hold - 1x daily - 7x weekly Standing Hip Abduction with Counter Support - 10 reps - 2 sets - 5 hold - 1x daily - 7x weekly Heel rises with counter support - 10 reps - 2 sets - 5 hold - 1x daily - 7x weekly Sit to Stand - 10 reps - 2 sets - 5 hold - 1x daily - 7x weekly Seated Hip Adduction Isometrics with Ball - 10 reps - 2 sets - 5 hold - 1x daily - 7x weekly Seated Long Arc Quad - 10 reps - 2 sets - 5 hold - 1x daily - 7x weekly Seated Heel Raise - 10 reps - 2 sets - 5 hold - 1x daily - 7x weekly Seated March - 10 reps - 2 sets - 5 hold - 1x daily - 7x weekly

## 2019-06-17 NOTE — Therapy (Signed)
[place] MAIN [facility] SERVICES [address] [place], [place], [address] Phone: [phone]   Fax:  [phone]  Physical Therapy Evaluation  Patient Details  Name: [name] MRN: [id number] Date of Birth: December 16, 1933 Referring Provider (PT): Eulogio Ditch   Encounter Date: 06/17/2019  PT End of Session - 06/17/19 1216    Visit Number  1    Number of Visits  16    Date for PT Re-Evaluation  08/12/19    Authorization Type  1/10 eval 10/16    PT Start Time  0855    PT Stop Time  0944    PT Time Calculation (min)  49 min    Equipment Utilized During Treatment  Gait belt    Activity Tolerance  Patient tolerated treatment well    Behavior During Therapy  [facility] for tasks assessed/performed       Past Medical History:  Diagnosis Date  . AAA (abdominal aortic aneurysm) ([place])   . Arthritis   . Atherosclerotic heart disease of native coronary artery without angina pectoris 1998   - s/p CABG, occluded SVG-D1; patent LIMA-LAD, SVG-OM, SVG- RCA  . Basal cell carcinoma of eyelid 01/03/2013   2019 - R side of Nose  . Benign neoplasm of ascending colon   . Benign neoplasm of descending colon   . Cancer [facility])    Skin cancer  . Colon polyps   . COPD (chronic obstructive pulmonary disease) ([place])   . Coronary artery disease   . Dementia ([place])   . Dysrhythmia    atrial fib  . Falls frequently   . GERD (gastroesophageal reflux disease)   . Hemorrhoid 02/10/2015  . History of hiatal hernia   . History of kidney stones   . HOH (hard of hearing)   . Hypertension   . Memory loss   . Osteoporosis   . PAF (paroxysmal atrial fibrillation) ([place])   . Prostate cancer ([place])   . Prostate disorder   . Sciatic pain    Chronic  . Sleep apnea    cpap  . Temporary cerebral vascular dysfunction 02/10/2015   Had negative work-up.  July, 2012.  No medication changes, had forgot them that day, got dehydrated.   . Urinary incontinence     Past Surgical  History:  Procedure Laterality Date  . ABDOMINAL AORTIC ENDOVASCULAR STENT GRAFT Bilateral 05/18/2019   Procedure: ABDOMINAL AORTIC ENDOVASCULAR STENT GRAFT;  Surgeon: Algernon Huxley, MD;  Location: [facility] ORS;  Service: Vascular;  Laterality: Bilateral;  . ANKLE SURGERY    . Arch aortogram and carotid aortogram  06/02/2006   Dr Oneida Alar did surgery  . BASAL CELL CARCINOMA EXCISION  04/2016   Dermatology  . CARDIAC CATHETERIZATION  01/30/1998    (Dr. Linard Millers): Native LAD & mRCA CTO. 90% D1. Mod Cx. SVG-D1 CTO. SVG-RCA & SVG-OM along with LIMA-LAD patent;  LV NORMAL Fxn  . CAROTID ENDARTERECTOMY Left Oct. 29, 2007   Dr. Oneida Alar:  . CATARACT EXTRACTION W/PHACO Right 03/05/2017   Procedure: CATARACT EXTRACTION PHACO AND INTRAOCULAR LENS PLACEMENT (IOC);  Surgeon: Leandrew Koyanagi, MD;  Location: [facility] ORS;  Service: Ophthalmology;  Laterality: Right;  [place] 00:58.5 AP% 10.6 CDE 6.20 Fluid Pack lot # [id number]  . CATARACT EXTRACTION W/PHACO Left 04/15/2017   Procedure: CATARACT EXTRACTION PHACO AND INTRAOCULAR LENS PLACEMENT ([place])  Left;  Surgeon: Leandrew Koyanagi, MD;  Location: [place];  Service: Ophthalmology;  Laterality: Left;  . COLONOSCOPY WITH PROPOFOL N/A 09/15/2017  Procedure: COLONOSCOPY WITH PROPOFOL;  Surgeon: Lucilla Lame, MD;  Location: [facility] ENDOSCOPY;  Service: Endoscopy;  Laterality: N/A;  . COLONOSCOPY WITH PROPOFOL N/A 10/26/2018   Procedure: COLONOSCOPY WITH PROPOFOL;  Surgeon: Lucilla Lame, MD;  Location: [facility] ENDOSCOPY;  Service: Endoscopy;  Laterality: N/A;  . CORONARY ARTERY BYPASS GRAFT  1998   LIMA-LAD, SVG-OM, SVG-RPDA, SVG-DIAG  . CPET / MET  12/2012   Mild chronotropic incompetence - read 82% of predicted; also reduced effort; peak VO2 15.7 / 75% (did not reach Max effort) -- suggested ischemic response in last 1.5 minutes of exercise. Normal pulmonary function on PFTs but poor response to  . DOPPLER ECHOCARDIOGRAPHY  11/20/2010   EF =>55%; LV norm mild aortic  scelorosis  . ENDOVASCULAR REPAIR/STENT GRAFT N/A 05/18/2019   Procedure: ENDOVASCULAR REPAIR/STENT GRAFT;  Surgeon: Algernon Huxley, MD;  Location: [place] CV LAB;  Service: Cardiovascular;  Laterality: N/A;  . ESOPHAGOGASTRODUODENOSCOPY (EGD) WITH PROPOFOL N/A 09/15/2017   Procedure: ESOPHAGOGASTRODUODENOSCOPY (EGD) WITH PROPOFOL;  Surgeon: Lucilla Lame, MD;  Location: [facility] ENDOSCOPY;  Service: Endoscopy;  Laterality: N/A;  . FRACTURE SURGERY Right 03/19/2015   wrist  . HIATAL HERNIA REPAIR    . ILIAC ARTERY STENT  12/15/2005   PTA and direct stenting rgt and lft common iliac arteries  . KYPHOPLASTY N/A 04/05/2019   Procedure: KYPHOPLASTY T12, L3, L4 L5;  Surgeon: Hessie Knows, MD;  Location: [facility] ORS;  Service: Orthopedics;  Laterality: N/A;  . NM GATED MYOVIEW ([place] HX)  12/2017   Low Risk - no ischemia or infarct.  EF > 65%. no RWMA  . NM MYOVIEW ([place] HX)  11/11/2010; February 2016   a) TM STRESS----NORMAL PERFUSION ,EF 68%; b) EF 50-55%. Normal LV function. No significant ischemia or infarction.  . OPEN REDUCTION INTERNAL FIXATION (ORIF) DISTAL RADIAL FRACTURE Left 01/13/2019   Procedure: OPEN REDUCTION INTERNAL FIXATION (ORIF) DISTAL  RADIAL FRACTURE - LEFT - SLEEP APNEA;  Surgeon: Hessie Knows, MD;  Location: [facility] ORS;  Service: Orthopedics;  Laterality: Left;  . ORIF ELBOW FRACTURE Right 01/01/2017   Procedure: OPEN REDUCTION INTERNAL FIXATION (ORIF) ELBOW/OLECRANON FRACTURE;  Surgeon: Hessie Knows, MD;  Location: [facility] ORS;  Service: Orthopedics;  Laterality: Right;  . ORIF WRIST FRACTURE Right 03/19/2015   Procedure: OPEN REDUCTION INTERNAL FIXATION (ORIF) WRIST FRACTURE;  Surgeon: Hessie Knows, MD;  Location: [facility] ORS;  Service: Orthopedics;  Laterality: Right;  . THORACIC AORTA - CAROTID ANGIOGRAM  October 2007   Dr. 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 TRANSTHORACIC ECHOCARDIOGRAM  February  2016   [facility]: Normal LV function. Dilated left atrium.    There were no vitals filed for this visit.   Subjective Assessment - 06/17/19 0907    Subjective  Patient is returning to physical therapy for balance and strength s/p AAA.    Pertinent History  Patient referred to physical therapy for balance/strength issues s/p abdominal aortic aneurysm (AAA) without rupture repair on 05/18/19 .  Patient has been seen by this therapist in the past (last seen in march of 2020) Patient had a fall on 01/08/19, MRI from [facility] dated 03/28/19 report acute compression fractures at T12, L3 and L4 as well as facet arthropathy at the lower lumbar levels and at L3-4 moderate central stenosis. Patient underwent injection on 10/6 at L4-5 and L5-S1. PMH includes AAA, arthritis, atherosclerotic heart disease, (s/p CABG), benign neoplasm of ascending/descending colon, COPD, CAD, dementia, dysrhythmia, frequent falls,  GERD, hemorrhoid, HOH, HTN, PAF, prostate cancer. Monday through Friday have 3 people come in different times from 730 to 12pm. Uses a walker occasionally now    Limitations  Walking;Standing;House hold activities;Other (comment);Lifting    How long can you sit comfortably?  20 minutes before back is painful    How long can you stand comfortably?  hour    How long can you walk comfortably?  using a walker about 30 minutes    Patient Stated Goals  patient would like to improve his balance and strenght.    Currently in Pain?  No/denies         [facility] PT Assessment - 06/17/19 0001      Assessment   Medical Diagnosis  balance/weakness    Referring Provider (PT)  Eulogio Ditch    Hand Dominance  Right    Prior Therapy  here      Precautions   Precautions  Back;Fall      Restrictions   Weight Bearing Restrictions  No      Balance Screen   Has the patient fallen in the past 6 months  Yes    How many times?  1    Has the patient had a decrease in activity level because of a fear of falling?   Yes    Is  the patient reluctant to leave their home because of a fear of falling?   Yes      [place] comes once a day for 4 hours   Type of [place] - single point;Grab bars - tub/shower;Grab bars - toilet;Walker - 2 wheels      Prior Function   Level of Independence  Independent with basic ADLs      Berg Balance Test   Sit to Stand  Able to stand  independently using hands    Standing Unsupported  Able to stand 2 minutes with supervision    Sitting with Back Unsupported but Feet Supported on Floor or Stool  Able to sit safely and securely 2 minutes    Stand to Sit  Controls descent by using hands    Transfers  Able to transfer safely, definite need of hands    Standing Unsupported with Eyes Closed  Able to stand 10 seconds with supervision    Standing Unsupported with Feet Together  Able to place feet together independently and stand for 1 minute with supervision    From Standing, Reach Forward with Outstretched Arm  Can reach forward >5 cm safely (2")    From Standing Position, Pick up Object from [place] to pick up shoe, needs supervision    From Standing Position, Turn to Look Behind Over each Shoulder  Looks behind one side only/other side shows less weight shift    Turn 360 Degrees  Able to turn 360 degrees safely but slowly    Standing Unsupported, Alternately Place Feet on Step/Stool  Able to complete >2 steps/needs minimal assist    Standing Unsupported, One Foot in Front  Able to plae foot ahead of the other independently and hold 30 seconds    Standing on One Leg  Tries to lift leg/unable to hold 3 seconds but remains standing independently    Total Score  37  Patient referred to physical therapy for balance/strength issues s/p abdominal aortic aneurysm (AAA) without rupture repair on 05/18/19 .  Patient  has been seen by this therapist in the past (last seen in march of 2020) Patient had a fall on 01/08/19, MRI from [facility] dated 03/28/19 report acute compression fractures at T12, L3 and L4 as well as facet arthropathy at the lower lumbar levels and at L3-4 moderate central stenosis. Patient underwent injection on 10/6 at L4-5 and L5-S1. PMH includes AAA, arthritis, atherosclerotic heart disease, (s/p CABG), benign neoplasm of ascending/descending colon, COPD, CAD, dementia, dysrhythmia, frequent falls, GERD, hemorrhoid, HOH, HTN, PAF, prostate cancer. Monday through Friday have 3 people come in different times from 730 to 12pm. Uses a walker occasionally now       PAIN: Back pain;  Worst back pain: 7/10 Best 0/10 Current 0/10  POSTURE: Seated: slight forward slump of shoulders forward head Standing: posterior weight shift with flattened lumbar spine  PROM/AROM: Limited hamstring length bilaterally  STRENGTH:  Graded on a 0-5 scale Muscle Group Left Right  Hip Flex 3+/5 4-/5  Hip Abd 3/5 3/5  Hip Add 2+/5 2+/5  Hip Ext 2+/5 2+/5  Hip IR/ER    Knee Flex 3+/5 4-/5  Knee Ext 3+/5 4-/5  Ankle DF 3+/5 4-/5  Ankle PF 3+/5 4-/5   SENSATION: Decreased L ankle: history of surgery for ankle  SPECIAL TESTS: LLE more limited than right. dysmetria  FUNCTIONAL MOBILITY:  STS: heavy use of hands on knees.   BALANCE: Dynamic Sitting Balance  Normal Able to sit unsupported and weight shift across midline maximally   Good Able to sit unsupported and weight shift across midline moderately   Good-/Fair+ Able to sit unsupported and weight shift across midline minimally   Fair Minimal weight shifting ipsilateral/front, difficulty crossing midline x  Fair- Reach to ipsilateral side and unable to weight shift   Poor + Able to sit unsupported with min A and reach to ipsilateral side, unable to weight shift   Poor Able to sit unsupported with mod A and reach ipsilateral/front-can't cross midline      Standing Dynamic Balance  Normal Stand independently unsupported, able to weight shift and cross midline maximally   Good Stand independently unsupported, able to weight shift and cross midline moderately   Good-/Fair+ Stand independently unsupported, able to weight shift across midline minimally   Fair Stand independently unsupported, weight shift, and reach ipsilaterally, loss of balance when crossing midline x  Poor+ Able to stand with Min A and reach ipsilaterally, unable to weight shift   Poor Able to stand with Mod A and minimally reach ipsilaterally, unable to cross midline.     Static Sitting Balance  Normal Able to maintain balance against maximal resistance   Good Able to maintain balance against moderate resistance   Good-/Fair+ Accepts minimal resistance x  Fair Able to sit unsupported without balance loss and without UE support   Poor+ Able to maintain with Minimal assistance from individual or chair   Poor Unable to maintain balance-requires mod/max support from individual or chair     Static Standing Balance  Normal Able to maintain standing balance against maximal resistance   Good Able to maintain standing balance against moderate resistance   Good-/Fair+ Able to maintain standing balance against minimal resistance   Fair Able to stand unsupported without UE support and without LOB for 1-2 min x  Fair- Requires Min A and UE support to maintain standing without loss of balance  Poor+ Requires mod A and UE support to maintain standing without loss of balance   Poor Requires max A and UE support to maintain standing balance without loss       GAIT: Patient ambulates with heel strike and absent toe off with and without AD. Limited step length bilaterally with poor clearance of bilateral LE's.   OUTCOME MEASURES: TEST Outcome Interpretation  5 times sit<>stand 17 sec with heavy hands on knees two near LOB  >1 yo, >15 sec indicates increased risk for falls  10  meter walk test    With walker: 9 seconds Without walker: 11 seconds  =0.91           m/s <1.0 m/s indicates increased risk for falls; limited community ambulator  ABC 71%       Berg Balance Assessment 37/56  <36/56 (100% risk for falls), 37-45 (80% risk for falls); 46-51 (>50% risk for falls); 52-55 (lower risk <25% of falls)         Objective measurements completed on examination: See above findings.       Access Code: [email or web address]  URL: [email or web address]  Date: 06/17/2019  Prepared by: Janna Arch   Exercises Standing March with Counter Support - 10 reps - 2 sets - 5 hold - 1x daily - 7x weekly Standing Hip Abduction with Counter Support - 10 reps - 2 sets - 5 hold - 1x daily - 7x weekly Heel rises with counter support - 10 reps - 2 sets - 5 hold - 1x daily - 7x weekly Sit to Stand - 10 reps - 2 sets - 5 hold - 1x daily - 7x weekly Seated Hip Adduction Isometrics with Ball - 10 reps - 2 sets - 5 hold - 1x daily - 7x weekly Seated Long Arc Quad - 10 reps - 2 sets - 5 hold - 1x daily - 7x weekly Seated Heel Raise - 10 reps - 2 sets - 5 hold - 1x daily - 7x weekly Seated March - 10 reps - 2 sets - 5 hold - 1x daily - 7x weekly         PT Education - 06/17/19 1216    Education provided  Yes    Education Details  goals, POC, HE P    Person(s) Educated  Patient    Methods  Explanation;Demonstration;Tactile cues;Verbal cues;Handout    Comprehension  Verbalized understanding;Returned demonstration;Verbal cues required;Tactile cues required       PT Short Term Goals - 06/17/19 1228      PT SHORT TERM GOAL #1   Title  Patient will be independent in home exercise program to improve strength/mobility for better functional independence with ADLs.    Baseline  10/16: HEP given    Time  4    Period  Weeks    Status  New    Target Date  07/15/19      PT SHORT TERM GOAL #2   Title  Patient will deny any falls over past 4 weeks to demonstrate improved safety  awareness at home and work.    Baseline  10/16: one major fall    Time  4    Period  Weeks    Status  New    Target Date  07/15/19        PT Long Term Goals - 06/17/19 1229      PT LONG TERM GOAL #1   Title  Patient will increase BLE gross strength to 4+/5 as to improve functional strength  for independent gait, increased standing tolerance and increased ADL ability.    Baseline  10/16: R gross 4-/5 with add/ext 3+/5 L grossly 3+/5 with hip add/ext 2+/5    Time  8    Period  Weeks    Status  New    Target Date  08/12/19      PT LONG TERM GOAL #2   Title  Patient (> 68 years old) will complete five times sit to stand test in < 15 seconds indicating an increased LE strength and improved balance.    Baseline  10/16: 17 seconds with heavy use of hands on knees    Time  8    Period  Weeks    Status  New    Target Date  08/12/19      PT LONG TERM GOAL #3   Title  Patient will increase Berg Balance score by > 6 points (43/56)  to demonstrate decreased fall risk during functional activities.    Baseline  10/16: 37/56    Time  8    Period  Weeks    Status  New    Target Date  08/12/19      PT LONG TERM GOAL #4   Title  Patient will increase 10 meter walk test to >1.74ms as to improve gait speed for better community ambulation and to reduce fall risk.    Baseline  10/16: 0.91 w/o AD    Time  8    Period  Weeks    Status  New    Target Date  08/12/19      PT LONG TERM GOAL #5   Title  Patient will increase ABC scale score >80% to demonstrate better functional mobility and better confidence with ADLs.    Baseline  10/16; 71%    Time  8    Period  Weeks    Status  New    Target Date  08/12/19             Plan - 06/17/19 1226    Clinical Impression Statement  Patient is a pleasant 85year old male who is returning to physical therapy s/p AAA surgery with generalized weakness and balance deficits. Patient is challenged with dynamic motions as well as tasks that require  single limb stability as can be referenced in BERG score of 37/56. 10 MWT performed with and without a walker, with a walker patient has functional gait speed however without patient is challenged with stability resulting in reduced velocity. Sit to stand transfers are challenging to patient with limited ability to transfer without upper extremity support. Patient will benefit from skilled therapeutic intervention to improve strength and balance for increased safety and improved overall QOL    Personal Factors and Comorbidities  Age;Comorbidity 3+;Fitness;Past/Current Experience;Sex;Social Background;Time since onset of injury/illness/exacerbation;Transportation    Comorbidities  AAA, arthritis, atherosclerotic heart disease, (s/p CABG), benign neoplasm of ascending/descending colon, COPD, CAD, dementia, dysrhythmia, frequent falls, GERD, hemorrhoid, HOH, HTN, PAF, prostate cancer.    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    Clinical Decision Making  Moderate    Rehab Potential  Fair    PT Frequency  2x / week    PT Duration  8 weeks    PT Treatment/Interventions  ADLs/Self Care Home Management;Aquatic Therapy;Cryotherapy;Moist Heat;Traction;Ultrasound;Therapeutic activities;Functional mobility training;Stair training;Gait training;DME Instruction;Therapeutic exercise;Balance training;Neuromuscular re-education;Patient/family education;Manual techniques;Passive range of  motion;Energy conservation;Vestibular;Taping    PT Next Visit Plan  progress dynamic stability    PT Home Exercise Plan  see above    Consulted and Agree with Plan of Care  Patient        Patient will benefit from skilled therapeutic intervention in order to improve the following deficits and impairments:  Abnormal gait, Cardiopulmonary status limiting activity, Decreased activity tolerance, Decreased balance, Decreased knowledge of precautions, Decreased endurance, Decreased coordination, Decreased cognition, Decreased knowledge of use of DME, Decreased mobility, Difficulty walking, Decreased safety awareness, Decreased strength, Impaired flexibility, Impaired perceived functional ability, Impaired sensation, Postural dysfunction, Improper body mechanics, Pain  Visit Diagnosis: Unsteadiness on feet  Muscle weakness (generalized)  Other abnormalities of gait and mobility     Problem List Patient Active Problem List   Diagnosis Date Noted  . Status post abdominal aortic aneurysm (AAA) repair 04/15/2019  . Candidiasis 02/22/2019  . Malignant neoplasm of descending colon ([place])   . Benign neoplasm of descending colon   . Benign neoplasm of transverse colon   . Do not intubate, cardiopulmonary resuscitation (CPR)-only code status 10/08/2018  . Prostate cancer ([place]) 02/25/2018  . Prostatic cyst 01/15/2018  . 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 ([employer]) 09/15/2017  . Blood in stool   . Abnormal feces   . Stricture and stenosis of esophagus   . Mild cognitive impairment 08/18/2017  . Senile purpura ([place]) 04/29/2017  . Anemia 04/29/2017  . Frequent falls 04/29/2017  . Acute respiratory failure ([place]) 12/17/2016  . Simple chronic bronchitis ([place]) 12/17/2016  . Benign localized hyperplasia of prostate with urinary obstruction 07/15/2016  . Elevated PSA 07/15/2016  . Incomplete emptying of bladder 07/15/2016  . Urge incontinence 07/15/2016  . Hypothyroidism 04/25/2016  . Macular degeneration 04/25/2016  . Erectile dysfunction due to arterial insufficiency   . Ischemic heart disease with chronotropic incompetence   . CN (constipation) 02/10/2015  . DD (diverticular disease)  02/10/2015  . Fatigue 02/10/2015  . Lichen planus 43/32/9518  . Restless leg 02/10/2015  . 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 B12 deficiency 02/10/2015  . COPD (chronic obstructive pulmonary disease) ([place]) 11/14/2014  . Post Inflammatory Lung Changes 11/14/2014  . PAF (paroxysmal atrial fibrillation) ([facility]) CHA2DS2-VASc = 4. AC = Eliquis   . Insomnia 12/09/2013  . OSA on CPAP   . Dyspnea on exertion - -essentially resolved with BB dose reduction & wgt loss 03/29/2013  . Overweight (BMI 25.0-29.9) -- Wgt back up 01/17/2013  . Atherosclerotic heart disease of native coronary artery without angina pectoris - s/p CABG, occluded SVG-D1; patent LIMA-LAD, SVG-OM, SVG- RCA   . Essential hypertension   . Hyperlipidemia with target LDL less than 70   . Aorto-iliac disease ([place])   . BCC (basal cell carcinoma), eyelid 01/03/2013  . Allergic rhinitis 08/17/2009  . Adaptation reaction 05/28/2009  . Acid reflux 05/28/2009  . Arthritis, degenerative 05/28/2009  . 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 Carotid artery occlusion without infarction 06/01/2006  . PAD (peripheral artery disease) - bilateral common iliac stents 12/15/2005  . Aortoiliac occlusive disease ([place]) 11/30/2005  . Atherosclerotic heart disease of artery bypass graft 09/01/1997   Janna Arch, PT, DPT   06/17/2019, 12:35 PM  [place] MAIN [facility] SERVICES [address] [place], [place], [address] Phone: [phone]   Fax:  [phone]  Name: [name] MRN: [id number] Date of Birth: 1934-06-14

## 2019-06-20 ENCOUNTER — Telehealth: Payer: Self-pay | Admitting: *Deleted

## 2019-06-20 NOTE — Telephone Encounter (Signed)
[name], ask if I would call back tomorrow after pm.

## 2019-06-21 ENCOUNTER — Other Ambulatory Visit: Payer: Self-pay

## 2019-06-21 ENCOUNTER — Ambulatory Visit: Payer: Medicare Other

## 2019-06-21 DIAGNOSIS — R2689 Other abnormalities of gait and mobility: Secondary | ICD-10-CM | POA: Diagnosis not present

## 2019-06-21 DIAGNOSIS — M6281 Muscle weakness (generalized): Secondary | ICD-10-CM

## 2019-06-21 DIAGNOSIS — R262 Difficulty in walking, not elsewhere classified: Secondary | ICD-10-CM | POA: Diagnosis not present

## 2019-06-21 DIAGNOSIS — R2681 Unsteadiness on feet: Secondary | ICD-10-CM | POA: Diagnosis not present

## 2019-06-21 NOTE — Therapy (Signed)
[place] MAIN [facility] SERVICES [address] [place], [place], [address] Phone: [phone]   Fax:  [phone]  Physical Therapy Treatment  Patient Details  Name: [name] MRN: [id number] Date of Birth: 01-Feb-1934 Referring Provider (PT): Eulogio Ditch   Encounter Date: 06/21/2019  PT End of Session - 06/21/19 0858    Visit Number  2    Number of Visits  16    Date for PT Re-Evaluation  08/12/19    Authorization Type  2/10 eval 10/16    PT Start Time  0845    PT Stop Time  0929    PT Time Calculation (min)  44 min    Equipment Utilized During Treatment  Gait belt    Activity Tolerance  Patient tolerated treatment well    Behavior During Therapy  [facility] for tasks assessed/performed       Past Medical History:  Diagnosis Date  . AAA (abdominal aortic aneurysm) ([place])   . Arthritis   . Atherosclerotic heart disease of native coronary artery without angina pectoris 1998   - s/p CABG, occluded SVG-D1; patent LIMA-LAD, SVG-OM, SVG- RCA  . Basal cell carcinoma of eyelid 01/03/2013   2019 - R side of Nose  . Benign neoplasm of ascending colon   . Benign neoplasm of descending colon   . Cancer [facility])    Skin cancer  . Colon polyps   . COPD (chronic obstructive pulmonary disease) ([place])   . Coronary artery disease   . Dementia ([place])   . Dysrhythmia    atrial fib  . Falls frequently   . GERD (gastroesophageal reflux disease)   . Hemorrhoid 02/10/2015  . History of hiatal hernia   . History of kidney stones   . HOH (hard of hearing)   . Hypertension   . Memory loss   . Osteoporosis   . PAF (paroxysmal atrial fibrillation) ([place])   . Prostate cancer ([place])   . Prostate disorder   . Sciatic pain    Chronic  . Sleep apnea    cpap  . Temporary cerebral vascular dysfunction 02/10/2015   Had negative work-up.  July, 2012.  No medication changes, had forgot them that day, got dehydrated.   . Urinary incontinence     Past Surgical  History:  Procedure Laterality Date  . ABDOMINAL AORTIC ENDOVASCULAR STENT GRAFT Bilateral 05/18/2019   Procedure: ABDOMINAL AORTIC ENDOVASCULAR STENT GRAFT;  Surgeon: Algernon Huxley, MD;  Location: [facility] ORS;  Service: Vascular;  Laterality: Bilateral;  . ANKLE SURGERY    . Arch aortogram and carotid aortogram  06/02/2006   Dr Oneida Alar did surgery  . BASAL CELL CARCINOMA EXCISION  04/2016   Dermatology  . CARDIAC CATHETERIZATION  01/30/1998    (Dr. Linard Millers): Native LAD & mRCA CTO. 90% D1. Mod Cx. SVG-D1 CTO. SVG-RCA & SVG-OM along with LIMA-LAD patent;  LV NORMAL Fxn  . CAROTID ENDARTERECTOMY Left Oct. 29, 2007   Dr. Oneida Alar:  . CATARACT EXTRACTION W/PHACO Right 03/05/2017   Procedure: CATARACT EXTRACTION PHACO AND INTRAOCULAR LENS PLACEMENT (IOC);  Surgeon: Leandrew Koyanagi, MD;  Location: [facility] ORS;  Service: Ophthalmology;  Laterality: Right;  [place] 00:58.5 AP% 10.6 CDE 6.20 Fluid Pack lot # [id number]  . CATARACT EXTRACTION W/PHACO Left 04/15/2017   Procedure: CATARACT EXTRACTION PHACO AND INTRAOCULAR LENS PLACEMENT ([place])  Left;  Surgeon: Leandrew Koyanagi, MD;  Location: [place];  Service: Ophthalmology;  Laterality: Left;  . COLONOSCOPY WITH PROPOFOL N/A 09/15/2017  Procedure: COLONOSCOPY WITH PROPOFOL;  Surgeon: Lucilla Lame, MD;  Location: [facility] ENDOSCOPY;  Service: Endoscopy;  Laterality: N/A;  . COLONOSCOPY WITH PROPOFOL N/A 10/26/2018   Procedure: COLONOSCOPY WITH PROPOFOL;  Surgeon: Lucilla Lame, MD;  Location: [facility] ENDOSCOPY;  Service: Endoscopy;  Laterality: N/A;  . CORONARY ARTERY BYPASS GRAFT  1998   LIMA-LAD, SVG-OM, SVG-RPDA, SVG-DIAG  . CPET / MET  12/2012   Mild chronotropic incompetence - read 82% of predicted; also reduced effort; peak VO2 15.7 / 75% (did not reach Max effort) -- suggested ischemic response in last 1.5 minutes of exercise. Normal pulmonary function on PFTs but poor response to  . DOPPLER ECHOCARDIOGRAPHY  11/20/2010   EF =>55%; LV norm mild aortic  scelorosis  . ENDOVASCULAR REPAIR/STENT GRAFT N/A 05/18/2019   Procedure: ENDOVASCULAR REPAIR/STENT GRAFT;  Surgeon: Algernon Huxley, MD;  Location: [place] CV LAB;  Service: Cardiovascular;  Laterality: N/A;  . ESOPHAGOGASTRODUODENOSCOPY (EGD) WITH PROPOFOL N/A 09/15/2017   Procedure: ESOPHAGOGASTRODUODENOSCOPY (EGD) WITH PROPOFOL;  Surgeon: Lucilla Lame, MD;  Location: [facility] ENDOSCOPY;  Service: Endoscopy;  Laterality: N/A;  . FRACTURE SURGERY Right 03/19/2015   wrist  . HIATAL HERNIA REPAIR    . ILIAC ARTERY STENT  12/15/2005   PTA and direct stenting rgt and lft common iliac arteries  . KYPHOPLASTY N/A 04/05/2019   Procedure: KYPHOPLASTY T12, L3, L4 L5;  Surgeon: Hessie Knows, MD;  Location: [facility] ORS;  Service: Orthopedics;  Laterality: N/A;  . NM GATED MYOVIEW ([place] [facility])  12/2017   Low Risk - no ischemia or infarct.  EF > 65%. no RWMA  . NM MYOVIEW ([place] [facility])  11/11/2010; February 2016   a) TM STRESS----NORMAL PERFUSION ,EF 68%; b) EF 50-55%. Normal LV function. No significant ischemia or infarction.  . OPEN REDUCTION INTERNAL FIXATION (ORIF) DISTAL RADIAL FRACTURE Left 01/13/2019   Procedure: OPEN REDUCTION INTERNAL FIXATION (ORIF) DISTAL  RADIAL FRACTURE - LEFT - SLEEP APNEA;  Surgeon: Hessie Knows, MD;  Location: [facility] ORS;  Service: Orthopedics;  Laterality: Left;  . ORIF ELBOW FRACTURE Right 01/01/2017   Procedure: OPEN REDUCTION INTERNAL FIXATION (ORIF) ELBOW/OLECRANON FRACTURE;  Surgeon: Hessie Knows, MD;  Location: [facility] ORS;  Service: Orthopedics;  Laterality: Right;  . ORIF WRIST FRACTURE Right 03/19/2015   Procedure: OPEN REDUCTION INTERNAL FIXATION (ORIF) WRIST FRACTURE;  Surgeon: Hessie Knows, MD;  Location: [facility] ORS;  Service: Orthopedics;  Laterality: Right;  . THORACIC AORTA - CAROTID ANGIOGRAM  October 2007   Dr. 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 TRANSTHORACIC ECHOCARDIOGRAM  February  2016   [facility]: Normal LV function. Dilated left atrium.    There were no vitals filed for this visit.  Subjective Assessment - 06/21/19 0857    Subjective  Patient reports compliance with HEP. no falls or LOB since last session.    Pertinent History  Patient referred to physical therapy for balance/strength issues s/p abdominal aortic aneurysm (AAA) without rupture repair on 05/18/19 .  Patient has been seen by this therapist in the past (last seen in march of 2020) Patient had a fall on 01/08/19, MRI from [facility] dated 03/28/19 report acute compression fractures at T12, L3 and L4 as well as facet arthropathy at the lower lumbar levels and at L3-4 moderate central stenosis. Patient underwent injection on 10/6 at L4-5 and L5-S1. PMH includes AAA, arthritis, atherosclerotic heart disease, (s/p CABG), benign neoplasm of ascending/descending colon, COPD, CAD, dementia, dysrhythmia, frequent falls, GERD,  hemorrhoid, HOH, HTN, PAF, prostate cancer. Monday through Friday have 3 people come in different times from 730 to 12pm. Uses a walker occasionally now    Limitations  Walking;Standing;House hold activities;Other (comment);Lifting    How long can you sit comfortably?  20 minutes before back is painful    How long can you stand comfortably?  hour    How long can you walk comfortably?  using a walker about 30 minutes    Patient Stated Goals  patient would like to improve his balance and strenght.    Currently in Pain?  No/denies         Neuromuscular Re-education: verbal cueing for task orientation and body mechanics for stability.  -Horizontal head turns reading cards in hallway 2x 86 ft to each side, CGA no LOB -red light green light game: for initiation and termination of ambulation while maintaining COM for stability and decreased episodes of LOB 2x30 feet; -step over two consecutive hurdles in // bars with decreasing UE support from BUE support to no UE support 8x length of // bars Standing ball pass  with PT 10x, bounce pass 10x  Vitals monitored throughout session during rest periods to ensure O2 saturation return to functional range prior to initiation of each exercise  Therex:  Standing with # 4 ankle weight: CGA for stability  -Hip extension with bilateral upper extremity support, cueing for neutral hip alignment, upright posture for optimal muscle recruitment, and sequencing, 10x each LE,  -Hip abduction with bilateral upper extremity support, cueing for neutral foot alignment for correct muscle activation, 10x each LE -Hip flexion with bilateral upper extremity support, cueing for body mechanics, speed of muscle recruitment for optimal strengthening and stabilization 10x each LE   Seated with #4ankle weights  -Seated marches with upright posture, back away from back of chair for abdominal/trunk activation/stabilization, 10x each LE -Seated LAQ with 3 second holds, 10x each LE, cueing for muscle activation and sequencing for neutral alignment  -sit to stand 10x with weighed ball (2000 gr)  -weighted ball from one side of body to the other 10x with focus on abdominal activation     Pt educated throughout session about proper posture and technique with exercises. Improved exercise technique, movement at target joints, use of target muscles after min to mod verbal, visual, tactile cues.                     PT Education - 06/21/19 0858    Education provided  Yes    Education Details  exercise technique, body mechanics    Person(s) Educated  Patient    Methods  Explanation;Demonstration;Tactile cues;Verbal cues    Comprehension  Verbalized understanding;Returned demonstration;Verbal cues required;Tactile cues required       PT Short Term Goals - 06/17/19 1228      PT SHORT TERM GOAL #1   Title  Patient will be independent in home exercise program to improve strength/mobility for better functional independence with ADLs.    Baseline  10/16: HEP given    Time   4    Period  Weeks    Status  New    Target Date  07/15/19      PT SHORT TERM GOAL #2   Title  Patient will deny any falls over past 4 weeks to demonstrate improved safety awareness at home and work.    Baseline  10/16: one major fall    Time  4    Period  Weeks  Status  New    Target Date  07/15/19        PT Long Term Goals - 06/17/19 1229      PT LONG TERM GOAL #1   Title  Patient will increase BLE gross strength to 4+/5 as to improve functional strength for independent gait, increased standing tolerance and increased ADL ability.    Baseline  10/16: R gross 4-/5 with add/ext 3+/5 L grossly 3+/5 with hip add/ext 2+/5    Time  8    Period  Weeks    Status  New    Target Date  08/12/19      PT LONG TERM GOAL #2   Title  Patient (> 36 years old) will complete five times sit to stand test in < 15 seconds indicating an increased LE strength and improved balance.    Baseline  10/16: 17 seconds with heavy use of hands on knees    Time  8    Period  Weeks    Status  New    Target Date  08/12/19      PT LONG TERM GOAL #3   Title  Patient will increase Berg Balance score by > 6 points (43/56)  to demonstrate decreased fall risk during functional activities.    Baseline  10/16: 37/56    Time  8    Period  Weeks    Status  New    Target Date  08/12/19      PT LONG TERM GOAL #4   Title  Patient will increase 10 meter walk test to >1.38ms as to improve gait speed for better community ambulation and to reduce fall risk.    Baseline  10/16: 0.91 w/o AD    Time  8    Period  Weeks    Status  New    Target Date  08/12/19      PT LONG TERM GOAL #5   Title  Patient will increase ABC scale score >80% to demonstrate better functional mobility and better confidence with ADLs.    Baseline  10/16; 71%    Time  8    Period  Weeks    Status  New    Target Date  08/12/19            Plan - 06/21/19 1253    Clinical Impression Statement  Patient is highly motivated and eager  to participate in therapy session. Fatigues quickly with prolonged standing requiring seated interventions inbetween standing ones. Patient performs better with multimodal cueing of visual, tactile, and verbal. With ambulation he has periodic posterior COM sway resulting in near LOB. Patient will benefit from skilled therapeutic intervention to improve strength and balance for increased safety and improved overall QOL    Personal Factors and Comorbidities  Age;Comorbidity 3+;Fitness;Past/Current Experience;Sex;Social Background;Time since onset of injury/illness/exacerbation;Transportation    Comorbidities  AAA, arthritis, atherosclerotic heart disease, (s/p CABG), benign neoplasm of ascending/descending colon, COPD, CAD, dementia, dysrhythmia, frequent falls, GERD, hemorrhoid, HOH, HTN, PAF, prostate cancer.    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    Rehab Potential  Fair    PT Frequency  2x / week    PT Duration  8 weeks    PT Treatment/Interventions  ADLs/Self Care Home Management;Aquatic Therapy;Cryotherapy;Moist Heat;Traction;Ultrasound;Therapeutic activities;Functional mobility training;Stair training;Gait training;DME Instruction;Therapeutic exercise;Balance training;Neuromuscular re-education;Patient/family education;Manual techniques;Passive range of motion;Energy conservation;Vestibular;Taping  PT Next Visit Plan  progress dynamic stability    PT Home Exercise Plan  see above    Consulted and Agree with Plan of Care  Patient       Patient will benefit from skilled therapeutic intervention in order to improve the following deficits and  impairments:  Abnormal gait, Cardiopulmonary status limiting activity, Decreased activity tolerance, Decreased balance, Decreased knowledge of precautions, Decreased endurance, Decreased coordination, Decreased cognition, Decreased knowledge of use of DME, Decreased mobility, Difficulty walking, Decreased safety awareness, Decreased strength, Impaired flexibility, Impaired perceived functional ability, Impaired sensation, Postural dysfunction, Improper body mechanics, Pain  Visit Diagnosis: Unsteadiness on feet  Muscle weakness (generalized)  Other abnormalities of gait and mobility     Problem List Patient Active Problem List   Diagnosis Date Noted  . Status post abdominal aortic aneurysm (AAA) repair 04/15/2019  . Candidiasis 02/22/2019  . Malignant neoplasm of descending colon ([place])   . Benign neoplasm of descending colon   . Benign neoplasm of transverse colon   . Do not intubate, cardiopulmonary resuscitation (CPR)-only code status 10/08/2018  . Prostate cancer ([place]) 02/25/2018  . Prostatic cyst 01/15/2018  . 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 ([place]) 09/15/2017  . Blood in stool   . Abnormal feces   . Stricture and stenosis of esophagus   . Mild cognitive impairment 08/18/2017  . Senile purpura ([place]) 04/29/2017  . Anemia 04/29/2017  . Frequent falls 04/29/2017  . Acute respiratory failure ([place]) 12/17/2016  . Simple chronic bronchitis ([place]) 12/17/2016  . Benign localized hyperplasia of prostate with urinary obstruction 07/15/2016  . Elevated PSA 07/15/2016  . Incomplete emptying of bladder 07/15/2016  . Urge incontinence 07/15/2016  . Hypothyroidism 04/25/2016  . Macular degeneration 04/25/2016  . Erectile dysfunction due to arterial insufficiency   . Ischemic heart disease with chronotropic incompetence   . CN (constipation) 02/10/2015  . DD (diverticular disease) 02/10/2015  . Fatigue 02/10/2015  . Lichen planus 49/67/5916  . Restless leg 02/10/2015  . Circadian rhythm  disorder 02/10/2015  . B12 deficiency 02/10/2015  . COPD (chronic obstructive pulmonary disease) ([place]) 11/14/2014  . Post Inflammatory Lung Changes 11/14/2014  . PAF (paroxysmal atrial fibrillation) ([facility]) CHA2DS2-VASc = 4. AC = Eliquis   . Insomnia 12/09/2013  . OSA on CPAP   . Dyspnea on exertion - -essentially resolved with BB dose reduction & wgt loss 03/29/2013  . Overweight (BMI 25.0-29.9) -- Wgt back up 01/17/2013  . Atherosclerotic heart disease of native coronary artery without angina pectoris - s/p CABG, occluded SVG-D1; patent LIMA-LAD, SVG-OM, SVG- RCA   . Essential hypertension   . Hyperlipidemia with target LDL less than 70   . Aorto-iliac disease ([place])   . BCC (basal cell carcinoma), eyelid 01/03/2013  . Allergic rhinitis 08/17/2009  . Adaptation reaction 05/28/2009  . Acid reflux 05/28/2009  . Arthritis, degenerative 05/28/2009  . 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 Carotid artery occlusion without infarction 06/01/2006  . PAD (peripheral artery disease) - bilateral common iliac stents 12/15/2005  . Aortoiliac occlusive disease ([place]) 11/30/2005  . Atherosclerotic heart disease of artery bypass graft 09/01/1997   Janna Arch, PT, DPT    06/21/2019, 12:54 PM  [place] MAIN [facility] SERVICES [address] [place], [place], [address] Phone: [phone]   Fax:  [phone]  Name: [name] MRN: [id number] Date of Birth: [birth year]

## 2019-06-24 ENCOUNTER — Other Ambulatory Visit: Payer: Self-pay

## 2019-06-24 ENCOUNTER — Ambulatory Visit: Payer: Medicare Other

## 2019-06-24 DIAGNOSIS — R2681 Unsteadiness on feet: Secondary | ICD-10-CM

## 2019-06-24 DIAGNOSIS — R2689 Other abnormalities of gait and mobility: Secondary | ICD-10-CM | POA: Diagnosis not present

## 2019-06-24 DIAGNOSIS — M6281 Muscle weakness (generalized): Secondary | ICD-10-CM

## 2019-06-24 DIAGNOSIS — R262 Difficulty in walking, not elsewhere classified: Secondary | ICD-10-CM | POA: Diagnosis not present

## 2019-06-24 NOTE — Therapy (Signed)
[place] MAIN [facility] SERVICES [address] [place], [place], [address] Phone: [phone]   Fax:  [phone]  Physical Therapy Treatment  Patient Details  Name: [name] MRN: [id number] Date of Birth: May 24, 1934 Referring Provider (PT): Eulogio Ditch   Encounter Date: 06/24/2019  PT End of Session - 06/24/19 0854    Visit Number  3    Number of Visits  16    Date for PT Re-Evaluation  08/12/19    Authorization Type  3/10 eval 10/16    PT Start Time  0857    PT Stop Time  0942    PT Time Calculation (min)  45 min    Equipment Utilized During Treatment  Gait belt    Activity Tolerance  Patient tolerated treatment well    Behavior During Therapy  [facility] for tasks assessed/performed       Past Medical History:  Diagnosis Date  . AAA (abdominal aortic aneurysm) ([place])   . Arthritis   . Atherosclerotic heart disease of native coronary artery without angina pectoris 1998   - s/p CABG, occluded SVG-D1; patent LIMA-LAD, SVG-OM, SVG- RCA  . Basal cell carcinoma of eyelid 01/03/2013   2019 - R side of Nose  . Benign neoplasm of ascending colon   . Benign neoplasm of descending colon   . Cancer [facility])    Skin cancer  . Colon polyps   . COPD (chronic obstructive pulmonary disease) ([place])   . Coronary artery disease   . Dementia ([place])   . Dysrhythmia    atrial fib  . Falls frequently   . GERD (gastroesophageal reflux disease)   . Hemorrhoid 02/10/2015  . History of hiatal hernia   . History of kidney stones   . HOH (hard of hearing)   . Hypertension   . Memory loss   . Osteoporosis   . PAF (paroxysmal atrial fibrillation) ([place])   . Prostate cancer ([place])   . Prostate disorder   . Sciatic pain    Chronic  . Sleep apnea    cpap  . Temporary cerebral vascular dysfunction 02/10/2015   Had negative work-up.  July, 2012.  No medication changes, had forgot them that day, got dehydrated.   . Urinary incontinence     Past Surgical  History:  Procedure Laterality Date  . ABDOMINAL AORTIC ENDOVASCULAR STENT GRAFT Bilateral 05/18/2019   Procedure: ABDOMINAL AORTIC ENDOVASCULAR STENT GRAFT;  Surgeon: Algernon Huxley, MD;  Location: [facility] ORS;  Service: Vascular;  Laterality: Bilateral;  . ANKLE SURGERY    . Arch aortogram and carotid aortogram  06/02/2006   Dr Oneida Alar did surgery  . BASAL CELL CARCINOMA EXCISION  04/2016   Dermatology  . CARDIAC CATHETERIZATION  01/30/1998    (Dr. Linard Millers): Native LAD & mRCA CTO. 90% D1. Mod Cx. SVG-D1 CTO. SVG-RCA & SVG-OM along with LIMA-LAD patent;  LV NORMAL Fxn  . CAROTID ENDARTERECTOMY Left Oct. 29, 2007   Dr. Oneida Alar:  . CATARACT EXTRACTION W/PHACO Right 03/05/2017   Procedure: CATARACT EXTRACTION PHACO AND INTRAOCULAR LENS PLACEMENT (IOC);  Surgeon: Leandrew Koyanagi, MD;  Location: [facility] ORS;  Service: Ophthalmology;  Laterality: Right;  [place] 00:58.5 AP% 10.6 CDE 6.20 Fluid Pack lot # [id number]  . CATARACT EXTRACTION W/PHACO Left 04/15/2017   Procedure: CATARACT EXTRACTION PHACO AND INTRAOCULAR LENS PLACEMENT ([place])  Left;  Surgeon: Leandrew Koyanagi, MD;  Location: [place];  Service: Ophthalmology;  Laterality: Left;  . COLONOSCOPY WITH PROPOFOL N/A 09/15/2017  Procedure: COLONOSCOPY WITH PROPOFOL;  Surgeon: Lucilla Lame, MD;  Location: [facility] ENDOSCOPY;  Service: Endoscopy;  Laterality: N/A;  . COLONOSCOPY WITH PROPOFOL N/A 10/26/2018   Procedure: COLONOSCOPY WITH PROPOFOL;  Surgeon: Lucilla Lame, MD;  Location: [facility] ENDOSCOPY;  Service: Endoscopy;  Laterality: N/A;  . CORONARY ARTERY BYPASS GRAFT  1998   LIMA-LAD, SVG-OM, SVG-RPDA, SVG-DIAG  . CPET / MET  12/2012   Mild chronotropic incompetence - read 82% of predicted; also reduced effort; peak VO2 15.7 / 75% (did not reach Max effort) -- suggested ischemic response in last 1.5 minutes of exercise. Normal pulmonary function on PFTs but poor response to  . DOPPLER ECHOCARDIOGRAPHY  11/20/2010   EF =>55%; LV norm mild aortic  scelorosis  . ENDOVASCULAR REPAIR/STENT GRAFT N/A 05/18/2019   Procedure: ENDOVASCULAR REPAIR/STENT GRAFT;  Surgeon: Algernon Huxley, MD;  Location: [place] [facility] LAB;  Service: Cardiovascular;  Laterality: N/A;  . ESOPHAGOGASTRODUODENOSCOPY (EGD) WITH PROPOFOL N/A 09/15/2017   Procedure: ESOPHAGOGASTRODUODENOSCOPY (EGD) WITH PROPOFOL;  Surgeon: Lucilla Lame, MD;  Location: [facility] ENDOSCOPY;  Service: Endoscopy;  Laterality: N/A;  . FRACTURE SURGERY Right 03/19/2015   wrist  . HIATAL HERNIA REPAIR    . ILIAC ARTERY STENT  12/15/2005   PTA and direct stenting rgt and lft common iliac arteries  . KYPHOPLASTY N/A 04/05/2019   Procedure: KYPHOPLASTY T12, L3, L4 L5;  Surgeon: Hessie Knows, MD;  Location: [facility] ORS;  Service: Orthopedics;  Laterality: N/A;  . NM GATED MYOVIEW ([place] [facility])  12/2017   Low Risk - no ischemia or infarct.  EF > 65%. no RWMA  . NM MYOVIEW ([place] [facility])  11/11/2010; February 2016   a) TM STRESS----NORMAL PERFUSION ,EF 68%; b) EF 50-55%. Normal LV function. No significant ischemia or infarction.  . OPEN REDUCTION INTERNAL FIXATION (ORIF) DISTAL RADIAL FRACTURE Left 01/13/2019   Procedure: OPEN REDUCTION INTERNAL FIXATION (ORIF) DISTAL  RADIAL FRACTURE - LEFT - SLEEP APNEA;  Surgeon: Hessie Knows, MD;  Location: [facility] ORS;  Service: Orthopedics;  Laterality: Left;  . ORIF ELBOW FRACTURE Right 01/01/2017   Procedure: OPEN REDUCTION INTERNAL FIXATION (ORIF) ELBOW/OLECRANON FRACTURE;  Surgeon: Hessie Knows, MD;  Location: [facility] ORS;  Service: Orthopedics;  Laterality: Right;  . ORIF WRIST FRACTURE Right 03/19/2015   Procedure: OPEN REDUCTION INTERNAL FIXATION (ORIF) WRIST FRACTURE;  Surgeon: Hessie Knows, MD;  Location: [facility] ORS;  Service: Orthopedics;  Laterality: Right;  . THORACIC AORTA - CAROTID ANGIOGRAM  October 2007   Dr. 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 TRANSTHORACIC ECHOCARDIOGRAM  February  2016   [facility]: Normal LV function. Dilated left atrium.    There were no vitals filed for this visit.  Subjective Assessment - 06/24/19 0900    Subjective  Patient reports compliance with HEP, no falls or LOB since last session, forgot to bring the tennis balls and walker to fix. Reports his son and daughter in law (living in a different place, didn't have any contact with the patient) has had COVID.    Pertinent History  Patient referred to physical therapy for balance/strength issues s/p abdominal aortic aneurysm (AAA) without rupture repair on 05/18/19 .  Patient has been seen by this therapist in the past (last seen in march of 2020) Patient had a fall on 01/08/19, MRI [facility] dated 03/28/19 report acute compression fractures at T12, L3 and L4 as well as facet arthropathy at the lower lumbar levels and at L3-4 moderate  central stenosis. Patient underwent injection on 10/6 at L4-5 and L5-S1. PMH includes AAA, arthritis, atherosclerotic heart disease, (s/p CABG), benign neoplasm of ascending/descending colon, COPD, CAD, dementia, dysrhythmia, frequent falls, GERD, hemorrhoid, HOH, HTN, PAF, prostate cancer. Monday through Friday have 3 people come in different times from 730 to 12pm. Uses a walker occasionally now    Limitations  Walking;Standing;House hold activities;Other (comment);Lifting    How long can you sit comfortably?  20 minutes before back is painful    How long can you stand comfortably?  hour    How long can you walk comfortably?  using a walker about 30 minutes    Patient Stated Goals  patient would like to improve his balance and strenght.    Currently in Pain?  No/denies        Nustep Lvl 3 RPM> 60 for cardiovascular challenge 3 minutes    Neuromuscular Re-education: verbal cueing for task orientation and body mechanics for stability.   -airex pad: one foot on 4" step one foot on airex pad, hold 30 seconds x 2 trials each LE -airex pad toe taps SUE support 10x each LE.  Fatiguing to patient  Ambulate without an AD with CGA 30 ft x 2 trials, occasional posterior LOB noted.    Seated balloon taps reaching inside/outside BOS for trunk stability and righting reactions x 2 minutes  Opposite UE/LE raises marching in seated position 10x each side; very challenging requires max cueing, should be repeated.   Ambulate with head turns looking for 6 cones hidden in gym, reaching outside BOS to pick up cones.   Therex:  RTB monster walks in // bars BUE support 4x length of // bars    7lb dumbbells in each hand: 10x sit to stand modified squats.   Quantum leg press: bilateral legs: #135 15x, 2 sets, cueing for decreased velocity of movement for optimal muscle recruitment  Quantum leg press: single leg : #60 10x each LE, 2 sets cueing for decreased velocity for movement with optimal muscle recruitment.   Vitals monitored throughout session during rest periods to ensure O2 saturation return to functional range prior to initiation of each exercise       Pt educated throughout session about proper posture and technique with exercises. Improved exercise technique, movement at target joints, use of target muscles after min to mod verbal, visual, tactile cues.                     PT Education - 06/24/19 0853    Education provided  Yes    Education Details  exercise technique, body mechanics    Person(s) Educated  Patient    Methods  Explanation;Demonstration;Tactile cues;Verbal cues    Comprehension  Verbalized understanding;Returned demonstration;Verbal cues required;Tactile cues required       PT Short Term Goals - 06/17/19 1228      PT SHORT TERM GOAL #1   Title  Patient will be independent in home exercise program to improve strength/mobility for better functional independence with ADLs.    Baseline  10/16: HEP given    Time  4    Period  Weeks    Status  New    Target Date  07/15/19      PT SHORT TERM GOAL #2   Title   Patient will deny any falls over past 4 weeks to demonstrate improved safety awareness at home and work.    Baseline  10/16: one major fall    Time  4    Period  Weeks    Status  New    Target Date  07/15/19        PT Long Term Goals - 06/17/19 1229      PT LONG TERM GOAL #1   Title  Patient will increase BLE gross strength to 4+/5 as to improve functional strength for independent gait, increased standing tolerance and increased ADL ability.    Baseline  10/16: R gross 4-/5 with add/ext 3+/5 L grossly 3+/5 with hip add/ext 2+/5    Time  8    Period  Weeks    Status  New    Target Date  08/12/19      PT LONG TERM GOAL #2   Title  Patient (> 72 years old) will complete five times sit to stand test in < 15 seconds indicating an increased LE strength and improved balance.    Baseline  10/16: 17 seconds with heavy use of hands on knees    Time  8    Period  Weeks    Status  New    Target Date  08/12/19      PT LONG TERM GOAL #3   Title  Patient will increase Berg Balance score by > 6 points (43/56)  to demonstrate decreased fall risk during functional activities.    Baseline  10/16: 37/56    Time  8    Period  Weeks    Status  New    Target Date  08/12/19      PT LONG TERM GOAL #4   Title  Patient will increase 10 meter walk test to >1.59ms as to improve gait speed for better community ambulation and to reduce fall risk.    Baseline  10/16: 0.91 w/o AD    Time  8    Period  Weeks    Status  New    Target Date  08/12/19      PT LONG TERM GOAL #5   Title  Patient will increase ABC scale score >80% to demonstrate better functional mobility and better confidence with ADLs.    Baseline  10/16; 71%    Time  8    Period  Weeks    Status  New    Target Date  08/12/19            Plan - 06/24/19 00017   Clinical Impression Statement  Patient is challenged with prolonged standing interventions that require dynamic stabilization due to fatigue and need for UE support.  Patient is highly motivated throughout session. Patient will benefit from skilled therapeutic intervention to improve strength and balance for increased safety and improved overall QOL    Personal Factors and Comorbidities  Age;Comorbidity 3+;Fitness;Past/Current Experience;Sex;Social Background;Time since onset of injury/illness/exacerbation;Transportation    Comorbidities  AAA, arthritis, atherosclerotic heart disease, (s/p CABG), benign neoplasm of ascending/descending colon, COPD, CAD, dementia, dysrhythmia, frequent falls, GERD, hemorrhoid, HOH, HTN, PAF, prostate cancer.    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    Rehab Potential  Fair    PT Frequency  2x / week    PT Duration  8 weeks    PT Treatment/Interventions  ADLs/Self Care Home Management;Aquatic Therapy;Cryotherapy;Moist Heat;Traction;Ultrasound;Therapeutic activities;Functional mobility training;Stair training;Gait training;DME Instruction;Therapeutic exercise;Balance training;Neuromuscular re-education;Patient/family education;Manual techniques;Passive range of motion;Energy conservation;Vestibular;Taping    PT Next Visit Plan  progress dynamic stability  PT Home Exercise Plan  see above    Consulted and Agree with Plan of Care  Patient       Patient will benefit from skilled therapeutic intervention in order to improve the following deficits and impairments:  Abnormal gait, Cardiopulmonary status limiting activity, Decreased activity tolerance, Decreased balance, Decreased knowledge of precautions, Decreased endurance, Decreased coordination, Decreased cognition, Decreased knowledge  of use of DME, Decreased mobility, Difficulty walking, Decreased safety awareness, Decreased strength, Impaired flexibility, Impaired perceived functional ability, Impaired sensation, Postural dysfunction, Improper body mechanics, Pain  Visit Diagnosis: Unsteadiness on feet  Muscle weakness (generalized)  Other abnormalities of gait and mobility     Problem List Patient Active Problem List   Diagnosis Date Noted  . Status post abdominal aortic aneurysm (AAA) repair 04/15/2019  . Candidiasis 02/22/2019  . Malignant neoplasm of descending colon ([place])   . Benign neoplasm of descending colon   . Benign neoplasm of transverse colon   . Do not intubate, cardiopulmonary resuscitation (CPR)-only code status 10/08/2018  . Prostate cancer ([place]) 02/25/2018  . Prostatic cyst 01/15/2018  . 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 ([place]) 09/15/2017  . Blood in stool   . Abnormal feces   . Stricture and stenosis of esophagus   . Mild cognitive impairment 08/18/2017  . Senile purpura ([place]) 04/29/2017  . Anemia 04/29/2017  . Frequent falls 04/29/2017  . Acute respiratory failure ([place]) 12/17/2016  . Simple chronic bronchitis ([place]) 12/17/2016  . Benign localized hyperplasia of prostate with urinary obstruction 07/15/2016  . Elevated PSA 07/15/2016  . Incomplete emptying of bladder 07/15/2016  . Urge incontinence 07/15/2016  . Hypothyroidism 04/25/2016  . Macular degeneration 04/25/2016  . Erectile dysfunction due to arterial insufficiency   . Ischemic heart disease with chronotropic incompetence   . CN (constipation) 02/10/2015  . DD (diverticular disease) 02/10/2015  . Fatigue 02/10/2015  . Lichen planus 38/06/1750  . Restless leg 02/10/2015  . 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 B12 deficiency 02/10/2015  . COPD (chronic obstructive pulmonary disease) ([place]) 11/14/2014  . Post Inflammatory Lung Changes 11/14/2014  . PAF (paroxysmal atrial fibrillation) ([facility]) CHA2DS2-VASc = 4. AC = Eliquis    . Insomnia 12/09/2013  . OSA on CPAP   . Dyspnea on exertion - -essentially resolved with BB dose reduction & wgt loss 03/29/2013  . Overweight (BMI 25.0-29.9) -- Wgt back up 01/17/2013  . Atherosclerotic heart disease of native coronary artery without angina pectoris - s/p CABG, occluded SVG-D1; patent LIMA-LAD, SVG-OM, SVG- RCA   . Essential hypertension   . Hyperlipidemia with target LDL less than 70   . Aorto-iliac disease ([place])   . BCC (basal cell carcinoma), eyelid 01/03/2013  . Allergic rhinitis 08/17/2009  . Adaptation reaction 05/28/2009  . Acid reflux 05/28/2009  . Arthritis, degenerative 05/28/2009  . 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 Carotid artery occlusion without infarction 06/01/2006  . PAD (peripheral artery disease) - bilateral common iliac stents 12/15/2005  . Aortoiliac occlusive disease ([place]) 11/30/2005  . Atherosclerotic heart disease of artery bypass graft 09/01/1997   Janna Arch, PT, DPT   06/24/2019, 9:44 AM  [place] MAIN [facility] SERVICES [address] [place], [place], [address] Phone: [phone]   Fax:  [phone]  Name: JAMARIO COLINA MRN: [id number] Date of Birth: 1934/07/25

## 2019-06-26 NOTE — Patient Instructions (Signed)
.   Please review the attached list of medications and notify my office if there are any errors.   . Please bring all of your medications to every appointment so we can make sure that our medication list is the same as yours.   . It is especially important to get the annual flu vaccine this year. If you haven't had it already, please go to your pharmacy or call the office as soon as possible to schedule you flu shot.  

## 2019-06-28 ENCOUNTER — Ambulatory Visit: Payer: Medicare Other

## 2019-06-28 ENCOUNTER — Other Ambulatory Visit: Payer: Self-pay

## 2019-06-28 DIAGNOSIS — R2689 Other abnormalities of gait and mobility: Secondary | ICD-10-CM | POA: Diagnosis not present

## 2019-06-28 DIAGNOSIS — M6281 Muscle weakness (generalized): Secondary | ICD-10-CM | POA: Diagnosis not present

## 2019-06-28 DIAGNOSIS — G4733 Obstructive sleep apnea (adult) (pediatric): Secondary | ICD-10-CM | POA: Diagnosis not present

## 2019-06-28 DIAGNOSIS — R2681 Unsteadiness on feet: Secondary | ICD-10-CM

## 2019-06-28 DIAGNOSIS — Z9989 Dependence on other enabling machines and devices: Secondary | ICD-10-CM | POA: Diagnosis not present

## 2019-06-28 DIAGNOSIS — R262 Difficulty in walking, not elsewhere classified: Secondary | ICD-10-CM | POA: Diagnosis not present

## 2019-06-28 NOTE — Therapy (Signed)
[place] MAIN [facility] SERVICES [address] [place], [place], [address] Phone: [phone]   Fax:  [phone]  Physical Therapy Treatment  Patient Details  Name: [name] MRN: [id number] Date of Birth: November 04, 1933 Referring Provider (PT): Eulogio Ditch   Encounter Date: 06/28/2019  PT End of Session - 06/28/19 0906    Visit Number  4    Number of Visits  16    Date for PT Re-Evaluation  08/12/19    Authorization Type  4/10 eval 10/16    PT Start Time  0848    PT Stop Time  0930    PT Time Calculation (min)  42 min    Equipment Utilized During Treatment  Gait belt    Activity Tolerance  Patient tolerated treatment well    Behavior During Therapy  [facility] for tasks assessed/performed       Past Medical History:  Diagnosis Date  . AAA (abdominal aortic aneurysm) ([place])   . Arthritis   . Atherosclerotic heart disease of native coronary artery without angina pectoris 1998   - s/p CABG, occluded SVG-D1; patent LIMA-LAD, SVG-OM, SVG- RCA  . Basal cell carcinoma of eyelid 01/03/2013   2019 - R side of Nose  . Benign neoplasm of ascending colon   . Benign neoplasm of descending colon   . Cancer [facility])    Skin cancer  . Colon polyps   . COPD (chronic obstructive pulmonary disease) ([place])   . Coronary artery disease   . Dementia ([place])   . Dysrhythmia    atrial fib  . Falls frequently   . GERD (gastroesophageal reflux disease)   . Hemorrhoid 02/10/2015  . History of hiatal hernia   . History of kidney stones   . HOH (hard of hearing)   . Hypertension   . Memory loss   . Osteoporosis   . PAF (paroxysmal atrial fibrillation) ([place])   . Prostate cancer ([place])   . Prostate disorder   . Sciatic pain    Chronic  . Sleep apnea    cpap  . Temporary cerebral vascular dysfunction 02/10/2015   Had negative work-up.  July, 2012.  No medication changes, had forgot them that day, got dehydrated.   . Urinary incontinence     Past Surgical  History:  Procedure Laterality Date  . ABDOMINAL AORTIC ENDOVASCULAR STENT GRAFT Bilateral 05/18/2019   Procedure: ABDOMINAL AORTIC ENDOVASCULAR STENT GRAFT;  Surgeon: Algernon Huxley, MD;  Location: [facility] ORS;  Service: Vascular;  Laterality: Bilateral;  . ANKLE SURGERY    . Arch aortogram and carotid aortogram  06/02/2006   Dr Oneida Alar did surgery  . BASAL CELL CARCINOMA EXCISION  04/2016   Dermatology  . CARDIAC CATHETERIZATION  01/30/1998    (Dr. Linard Millers): Native LAD & mRCA CTO. 90% D1. Mod Cx. SVG-D1 CTO. SVG-RCA & SVG-OM along with LIMA-LAD patent;  LV NORMAL Fxn  . CAROTID ENDARTERECTOMY Left Oct. 29, 2007   Dr. Oneida Alar:  . CATARACT EXTRACTION W/PHACO Right 03/05/2017   Procedure: CATARACT EXTRACTION PHACO AND INTRAOCULAR LENS PLACEMENT (IOC);  Surgeon: Leandrew Koyanagi, MD;  Location: [facility] ORS;  Service: Ophthalmology;  Laterality: Right;  [place] 00:58.5 AP% 10.6 CDE 6.20 Fluid Pack lot # [id number]  . CATARACT EXTRACTION W/PHACO Left 04/15/2017   Procedure: CATARACT EXTRACTION PHACO AND INTRAOCULAR LENS PLACEMENT ([place])  Left;  Surgeon: Leandrew Koyanagi, MD;  Location: [place];  Service: Ophthalmology;  Laterality: Left;  . COLONOSCOPY WITH PROPOFOL N/A 09/15/2017  Procedure: COLONOSCOPY WITH PROPOFOL;  Surgeon: Lucilla Lame, MD;  Location: [facility] ENDOSCOPY;  Service: Endoscopy;  Laterality: N/A;  . COLONOSCOPY WITH PROPOFOL N/A 10/26/2018   Procedure: COLONOSCOPY WITH PROPOFOL;  Surgeon: Lucilla Lame, MD;  Location: [facility] ENDOSCOPY;  Service: Endoscopy;  Laterality: N/A;  . CORONARY ARTERY BYPASS GRAFT  1998   LIMA-LAD, SVG-OM, SVG-RPDA, SVG-DIAG  . CPET / MET  12/2012   Mild chronotropic incompetence - read 82% of predicted; also reduced effort; peak VO2 15.7 / 75% (did not reach Max effort) -- suggested ischemic response in last 1.5 minutes of exercise. Normal pulmonary function on PFTs but poor response to  . DOPPLER ECHOCARDIOGRAPHY  11/20/2010   EF =>55%; LV norm mild aortic  scelorosis  . ENDOVASCULAR REPAIR/STENT GRAFT N/A 05/18/2019   Procedure: ENDOVASCULAR REPAIR/STENT GRAFT;  Surgeon: Algernon Huxley, MD;  Location: [place] CV LAB;  Service: Cardiovascular;  Laterality: N/A;  . ESOPHAGOGASTRODUODENOSCOPY (EGD) WITH PROPOFOL N/A 09/15/2017   Procedure: ESOPHAGOGASTRODUODENOSCOPY (EGD) WITH PROPOFOL;  Surgeon: Lucilla Lame, MD;  Location: [facility] ENDOSCOPY;  Service: Endoscopy;  Laterality: N/A;  . FRACTURE SURGERY Right 03/19/2015   wrist  . HIATAL HERNIA REPAIR    . ILIAC ARTERY STENT  12/15/2005   PTA and direct stenting rgt and lft common iliac arteries  . KYPHOPLASTY N/A 04/05/2019   Procedure: KYPHOPLASTY T12, L3, L4 L5;  Surgeon: Hessie Knows, MD;  Location: [facility] ORS;  Service: Orthopedics;  Laterality: N/A;  . NM GATED MYOVIEW ([place] HX)  12/2017   Low Risk - no ischemia or infarct.  EF > 65%. no RWMA  . NM MYOVIEW ([place] HX)  11/11/2010; February 2016   a) TM STRESS----NORMAL PERFUSION ,EF 68%; b) EF 50-55%. Normal LV function. No significant ischemia or infarction.  . OPEN REDUCTION INTERNAL FIXATION (ORIF) DISTAL RADIAL FRACTURE Left 01/13/2019   Procedure: OPEN REDUCTION INTERNAL FIXATION (ORIF) DISTAL  RADIAL FRACTURE - LEFT - SLEEP APNEA;  Surgeon: Hessie Knows, MD;  Location: [facility] ORS;  Service: Orthopedics;  Laterality: Left;  . ORIF ELBOW FRACTURE Right 01/01/2017   Procedure: OPEN REDUCTION INTERNAL FIXATION (ORIF) ELBOW/OLECRANON FRACTURE;  Surgeon: Hessie Knows, MD;  Location: [facility] ORS;  Service: Orthopedics;  Laterality: Right;  . ORIF WRIST FRACTURE Right 03/19/2015   Procedure: OPEN REDUCTION INTERNAL FIXATION (ORIF) WRIST FRACTURE;  Surgeon: Hessie Knows, MD;  Location: [facility] ORS;  Service: Orthopedics;  Laterality: Right;  . THORACIC AORTA - CAROTID ANGIOGRAM  October 2007   Dr. 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 TRANSTHORACIC ECHOCARDIOGRAM  February  2016   [facility]: Normal LV function. Dilated left atrium.    There were no vitals filed for this visit.  Subjective Assessment - 06/28/19 0851    Subjective  Patient reported no pain at start of session, compliant with HEP.    Pertinent History  Patient referred to physical therapy for balance/strength issues s/p abdominal aortic aneurysm (AAA) without rupture repair on 05/18/19 .  Patient has been seen by this therapist in the past (last seen in march of 2020) Patient had a fall on 01/08/19, MRI [facility] dated 03/28/19 report acute compression fractures at T12, L3 and L4 as well as facet arthropathy at the lower lumbar levels and at L3-4 moderate central stenosis. Patient underwent injection on 10/6 at L4-5 and L5-S1. PMH includes AAA, arthritis, atherosclerotic heart disease, (s/p CABG), benign neoplasm of ascending/descending colon, COPD, CAD, dementia, dysrhythmia, frequent falls, GERD, hemorrhoid,  HOH, HTN, PAF, prostate cancer. Monday through Friday have 3 people come in different times from 730 to 12pm. Uses a walker occasionally now    Limitations  Walking;Standing;House hold activities;Other (comment);Lifting    How long can you sit comfortably?  20 minutes before back is painful    How long can you stand comfortably?  hour    How long can you walk comfortably?  using a walker about 30 minutes    Currently in Pain?  No/denies      TREATMENT: Nustep Lvl 3 RPM> 60 for cardiovascular challenge 4 minutes    Neuromuscular Re-education: verbal cueing for task orientation and body mechanics for stability.     Ambulate without an AD with CGA 3 x 2 for 27f, occasional lateral LOB noted.  Ambulation with heel strike and arm swing cues 71fx2 with fair carry over, challenged by multitasking Head turns horizontal, vertical 7024f2 ea direction CGA-MIN, gait deviations/path deviations noted  Opposite UE/LE raises marching in seated position; very challenging requires max cueing  Unable to alternate;  x10 ea side easier on LLE and RUE than opposite.  Several attempts at alternating; pt needed tactile cues to promote proper           coordination of task, did have one rep of successful alternation at end of       attempts   Therex:  7lb dumbbells in each hand: x10 in wide stance sit to stands, x10 in standard stance x10 cues for velocity    Quantum leg press: bilateral legs: #135 15x, 2 sets, cueing for decreased velocity of movement for optimal muscle recruitment   Quantum leg press: single leg : #60 10x each LE, 2 sets cueing for decreased velocity for movement with optimal muscle recruitment.    Vitals monitored throughout session during rest periods to ensure O2 saturation return to functional range prior to initiation of each exercise    Pt educated throughout session about proper posture and technique with exercises. Improved exercise technique, movement at target joints, use of target muscles after min to mod verbal, visual, tactile cues.  Pt response/clinical impression: The patient was most challenged by dual tasking activities this session. Some improvement noted in gait mechanics when patient attends to verbal cueing. The patient continued to need consistent cueing throughout session for proper form, technique, and velocity of movements to optimize benefit from activities. The patient would benefit from continued skilled PT intervention to maximize mobility, safety, and functional abilities.        PT Education - 06/28/19 0905    Education provided  Yes    Education Details  exercise technique, gait mechanics    Person(s) Educated  Patient    Methods  Explanation;Demonstration;Tactile cues;Verbal cues    Comprehension  Verbalized understanding;Returned demonstration;Verbal cues required;Tactile cues required;Need further instruction       PT Short Term Goals - 06/17/19 1228      PT SHORT TERM GOAL #1   Title  Patient will be independent in home exercise program to  improve strength/mobility for better functional independence with ADLs.    Baseline  10/16: HEP given    Time  4    Period  Weeks    Status  New    Target Date  07/15/19      PT SHORT TERM GOAL #2   Title  Patient will deny any falls over past 4 weeks to demonstrate improved safety awareness at home and work.    Baseline  10/16: one major fall    Time  4    Period  Weeks    Status  New    Target Date  07/15/19        PT Long Term Goals - 06/17/19 1229      PT LONG TERM GOAL #1   Title  Patient will increase BLE gross strength to 4+/5 as to improve functional strength for independent gait, increased standing tolerance and increased ADL ability.    Baseline  10/16: R gross 4-/5 with add/ext 3+/5 L grossly 3+/5 with hip add/ext 2+/5    Time  8    Period  Weeks    Status  New    Target Date  08/12/19      PT LONG TERM GOAL #2   Title  Patient (> 43 years old) will complete five times sit to stand test in < 15 seconds indicating an increased LE strength and improved balance.    Baseline  10/16: 17 seconds with heavy use of hands on knees    Time  8    Period  Weeks    Status  New    Target Date  08/12/19      PT LONG TERM GOAL #3   Title  Patient will increase Berg Balance score by > 6 points (43/56)  to demonstrate decreased fall risk during functional activities.    Baseline  10/16: 37/56    Time  8    Period  Weeks    Status  New    Target Date  08/12/19      PT LONG TERM GOAL #4   Title  Patient will increase 10 meter walk test to >1.54ms as to improve gait speed for better community ambulation and to reduce fall risk.    Baseline  10/16: 0.91 w/o AD    Time  8    Period  Weeks    Status  New    Target Date  08/12/19      PT LONG TERM GOAL #5   Title  Patient will increase ABC scale score >80% to demonstrate better functional mobility and better confidence with ADLs.    Baseline  10/16; 71%    Time  8    Period  Weeks    Status  New    Target Date  08/12/19             Plan - 06/28/19 1024    Clinical Impression Statement  The patient was most challenged by dual tasking activities this session. Some improvement noted in gait mechanics when patient attends to verbal cueing. The patient continued to need consistent cueing throughout session for proper form, technique, and velocity of movements to optimize benefit from activities. The patient would benefit from continued skilled PT intervention to maximize mobility, safety, and functional abilities.    Personal Factors and Comorbidities  Age;Comorbidity 3+;Fitness;Past/Current Experience;Sex;Social Background;Time since onset of injury/illness/exacerbation;Transportation    Comorbidities  AAA, arthritis, atherosclerotic heart disease, (s/p CABG), benign neoplasm of ascending/descending colon, COPD, CAD, dementia, dysrhythmia, frequent falls, GERD, hemorrhoid, HOH, HTN, PAF, prostate cancer.    Examination-Activity Limitations  Bathing;Bend;Carry;Dressing;Continence;Stairs;Squat;Reach Overhead;Locomotion Level;Lift;Stand;Transfers;Toileting    Examination-Participation Restrictions  Church;Cleaning;Community Activity;Driving;Interpersonal Relationship;Laundry;Volunteer;Shop;Personal Finances;Meal Prep;Yard Work    RPublixPotential  Fair    Clinical Impairments Affecting Rehab Potential  (+) good support system, high prior level of function (-) currently undergoing radiation therapy for prostate cancer, memory deficits     PT Frequency  2x / week  PT Duration  8 weeks    PT Treatment/Interventions  ADLs/Self Care Home Management;Aquatic Therapy;Cryotherapy;Moist Heat;Traction;Ultrasound;Therapeutic activities;Functional mobility training;Stair training;Gait training;DME Instruction;Therapeutic exercise;Balance training;Neuromuscular re-education;Patient/family education;Manual techniques;Passive range of motion;Energy conservation;Vestibular;Taping    PT Next Visit Plan  progress dynamic stability    PT  Home Exercise Plan  see above    Consulted and Agree with Plan of Care  Patient       Patient will benefit from skilled therapeutic intervention in order to improve the following deficits and impairments:  Abnormal gait, Cardiopulmonary status limiting activity, Decreased activity tolerance, Decreased balance, Decreased knowledge of precautions, Decreased endurance, Decreased coordination, Decreased cognition, Decreased knowledge of use of DME, Decreased mobility, Difficulty walking, Decreased safety awareness, Decreased strength, Impaired flexibility, Impaired perceived functional ability, Impaired sensation, Postural dysfunction, Improper body mechanics, Pain  Visit Diagnosis: Unsteadiness on feet  Muscle weakness (generalized)  Other abnormalities of gait and mobility  Difficulty in walking, not elsewhere classified     Problem List Patient Active Problem List   Diagnosis Date Noted  . Status post abdominal aortic aneurysm (AAA) repair 04/15/2019  . Candidiasis 02/22/2019  . Malignant neoplasm of descending colon ([place])   . Benign neoplasm of descending colon   . Benign neoplasm of transverse colon   . Do not intubate, cardiopulmonary resuscitation (CPR)-only code status 10/08/2018  . Prostate cancer ([place]) 02/25/2018  . Prostatic cyst 01/15/2018  . 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 ([place]) 09/15/2017  . Blood in stool   . Abnormal feces   . Stricture and stenosis of esophagus   . Mild cognitive impairment 08/18/2017  . Senile purpura ([place]) 04/29/2017  . Anemia 04/29/2017  . Frequent falls 04/29/2017  . Acute respiratory failure ([place]) 12/17/2016  . Simple chronic bronchitis ([place]) 12/17/2016  . Benign localized hyperplasia of prostate with urinary obstruction 07/15/2016  . Elevated PSA 07/15/2016  . Incomplete emptying of bladder 07/15/2016  . Urge incontinence 07/15/2016  . Hypothyroidism 04/25/2016  . Macular degeneration 04/25/2016  . Erectile dysfunction due to arterial  insufficiency   . Ischemic heart disease with chronotropic incompetence   . CN (constipation) 02/10/2015  . DD (diverticular disease) 02/10/2015  . Fatigue 02/10/2015  . Lichen planus 08/65/7846  . Restless leg 02/10/2015  . 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 B12 deficiency 02/10/2015  . COPD (chronic obstructive pulmonary disease) ([place]) 11/14/2014  . Post Inflammatory Lung Changes 11/14/2014  . PAF (paroxysmal atrial fibrillation) ([facility]) CHA2DS2-VASc = 4. AC = Eliquis   . Insomnia 12/09/2013  . OSA on CPAP   . Dyspnea on exertion - -essentially resolved with BB dose reduction & wgt loss 03/29/2013  . Overweight (BMI 25.0-29.9) -- Wgt back up 01/17/2013  . Atherosclerotic heart disease of native coronary artery without angina pectoris - s/p CABG, occluded SVG-D1; patent LIMA-LAD, SVG-OM, SVG- RCA   . Essential hypertension   . Hyperlipidemia with target LDL less than 70   . Aorto-iliac disease ([place])   . BCC (basal cell carcinoma), eyelid 01/03/2013  . Allergic rhinitis 08/17/2009  . Adaptation reaction 05/28/2009  . Acid reflux 05/28/2009  . Arthritis, degenerative 05/28/2009  . 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 Carotid artery occlusion without infarction 06/01/2006  . PAD (peripheral artery disease) - bilateral common iliac stents 12/15/2005  . Aortoiliac occlusive disease ([place]) 11/30/2005  . Atherosclerotic heart disease of artery bypass graft 09/01/1997    Lieutenant Diego PT, DPT 10:31 AM,06/28/19 [place] MAIN [facility] SERVICES [place], [place], [address] Phone: [phone]   Fax:  [phone]  Name: [name] MRN: [id number] Date of Birth: 10/05/1933

## 2019-06-30 ENCOUNTER — Telehealth: Payer: Self-pay

## 2019-06-30 ENCOUNTER — Ambulatory Visit
Admission: RE | Admit: 2019-06-30 | Discharge: 2019-06-30 | Disposition: A | Discharge status: Home / Self Care | Payer: Medicare Other | Source: Ambulatory Visit | Attending: Family Medicine | Admitting: Family Medicine

## 2019-06-30 DIAGNOSIS — M4856XA Collapsed vertebra, not elsewhere classified, lumbar region, initial encounter for fracture: Secondary | ICD-10-CM | POA: Diagnosis not present

## 2019-06-30 DIAGNOSIS — M810 Age-related osteoporosis without current pathological fracture: Secondary | ICD-10-CM

## 2019-06-30 DIAGNOSIS — S62109S Fracture of unspecified carpal bone, unspecified wrist, sequela: Secondary | ICD-10-CM | POA: Insufficient documentation

## 2019-06-30 MED ORDER — ALENDRONATE SODIUM 70 MG PO TABS: 70.0000 mg | ORAL_TABLET | ORAL | 12 refills | Status: DC

## 2019-06-30 NOTE — Telephone Encounter (Signed)
-----   Message from [name], MD sent at 06/30/2019 11:18 AM EDT ----- BMD confirms osteoporosis. Need to start alendronate 70 mg once a week. Please send prescription for #4 and refill x 12

## 2019-06-30 NOTE — Telephone Encounter (Signed)
Spoke with Fritz Pickerel and [name] about results. They want the prescription to be send to [facility].  Prescription sent in.

## 2019-07-01 ENCOUNTER — Ambulatory Visit: Payer: Medicare Other

## 2019-07-01 ENCOUNTER — Other Ambulatory Visit: Payer: Self-pay

## 2019-07-01 ENCOUNTER — Telehealth: Payer: Self-pay | Admitting: Family Medicine

## 2019-07-01 DIAGNOSIS — M6281 Muscle weakness (generalized): Secondary | ICD-10-CM

## 2019-07-01 DIAGNOSIS — R262 Difficulty in walking, not elsewhere classified: Secondary | ICD-10-CM | POA: Diagnosis not present

## 2019-07-01 DIAGNOSIS — R2689 Other abnormalities of gait and mobility: Secondary | ICD-10-CM | POA: Diagnosis not present

## 2019-07-01 DIAGNOSIS — R2681 Unsteadiness on feet: Secondary | ICD-10-CM | POA: Diagnosis not present

## 2019-07-01 NOTE — Therapy (Signed)
[place] MAIN [facility] SERVICES [address] [place], [place], [address] Phone: [phone]   Fax:  [phone]  Physical Therapy Treatment  Patient Details  Name: [name] MRN: [id number] Date of Birth: 1933/09/26 Referring Provider (PT): Eulogio Ditch   Encounter Date: 07/01/2019  PT End of Session - 07/01/19 0906    Visit Number  5    Number of Visits  16    Date for PT Re-Evaluation  08/12/19    Authorization Type  5/10 eval 10/16    PT Start Time  0900    PT Stop Time  0945    PT Time Calculation (min)  45 min    Equipment Utilized During Treatment  Gait belt    Activity Tolerance  Patient tolerated treatment well    Behavior During Therapy  [facility] for tasks assessed/performed       Past Medical History:  Diagnosis Date  . AAA (abdominal aortic aneurysm) ([place])   . Arthritis   . Atherosclerotic heart disease of native coronary artery without angina pectoris 1998   - s/p CABG, occluded SVG-D1; patent LIMA-LAD, SVG-OM, SVG- RCA  . Basal cell carcinoma of eyelid 01/03/2013   2019 - R side of Nose  . Benign neoplasm of ascending colon   . Benign neoplasm of descending colon   . Cancer [facility])    Skin cancer  . Colon polyps   . COPD (chronic obstructive pulmonary disease) ([place])   . Coronary artery disease   . Dementia ([place])   . Dysrhythmia    atrial fib  . Falls frequently   . GERD (gastroesophageal reflux disease)   . Hemorrhoid 02/10/2015  . History of hiatal hernia   . History of kidney stones   . HOH (hard of hearing)   . Hypertension   . Memory loss   . Osteoporosis   . PAF (paroxysmal atrial fibrillation) ([place])   . Prostate cancer ([place])   . Prostate disorder   . Sciatic pain    Chronic  . Sleep apnea    cpap  . Temporary cerebral vascular dysfunction 02/10/2015   Had negative work-up.  July, 2012.  No medication changes, had forgot them that day, got dehydrated.   . Urinary incontinence     Past Surgical  History:  Procedure Laterality Date  . ABDOMINAL AORTIC ENDOVASCULAR STENT GRAFT Bilateral 05/18/2019   Procedure: ABDOMINAL AORTIC ENDOVASCULAR STENT GRAFT;  Surgeon: Algernon Huxley, MD;  Location: [facility] ORS;  Service: Vascular;  Laterality: Bilateral;  . ANKLE SURGERY    . Arch aortogram and carotid aortogram  06/02/2006   Dr Oneida Alar did surgery  . BASAL CELL CARCINOMA EXCISION  04/2016   Dermatology  . CARDIAC CATHETERIZATION  01/30/1998    (Dr. Linard Millers): Native LAD & mRCA CTO. 90% D1. Mod Cx. SVG-D1 CTO. SVG-RCA & SVG-OM along with LIMA-LAD patent;  LV NORMAL Fxn  . CAROTID ENDARTERECTOMY Left Oct. 29, 2007   Dr. Oneida Alar:  . CATARACT EXTRACTION W/PHACO Right 03/05/2017   Procedure: CATARACT EXTRACTION PHACO AND INTRAOCULAR LENS PLACEMENT (IOC);  Surgeon: Leandrew Koyanagi, MD;  Location: [facility] ORS;  Service: Ophthalmology;  Laterality: Right;  [place] 00:58.5 AP% 10.6 CDE 6.20 Fluid Pack lot # [id number]  . CATARACT EXTRACTION W/PHACO Left 04/15/2017   Procedure: CATARACT EXTRACTION PHACO AND INTRAOCULAR LENS PLACEMENT ([place])  Left;  Surgeon: Leandrew Koyanagi, MD;  Location: [place];  Service: Ophthalmology;  Laterality: Left;  . COLONOSCOPY WITH PROPOFOL N/A 09/15/2017  Procedure: COLONOSCOPY WITH PROPOFOL;  Surgeon: Lucilla Lame, MD;  Location: [facility] ENDOSCOPY;  Service: Endoscopy;  Laterality: N/A;  . COLONOSCOPY WITH PROPOFOL N/A 10/26/2018   Procedure: COLONOSCOPY WITH PROPOFOL;  Surgeon: Lucilla Lame, MD;  Location: [facility] ENDOSCOPY;  Service: Endoscopy;  Laterality: N/A;  . CORONARY ARTERY BYPASS GRAFT  1998   LIMA-LAD, SVG-OM, SVG-RPDA, SVG-DIAG  . CPET / MET  12/2012   Mild chronotropic incompetence - read 82% of predicted; also reduced effort; peak VO2 15.7 / 75% (did not reach Max effort) -- suggested ischemic response in last 1.5 minutes of exercise. Normal pulmonary function on PFTs but poor response to  . DOPPLER ECHOCARDIOGRAPHY  11/20/2010   EF =>55%; LV norm mild aortic  scelorosis  . ENDOVASCULAR REPAIR/STENT GRAFT N/A 05/18/2019   Procedure: ENDOVASCULAR REPAIR/STENT GRAFT;  Surgeon: Algernon Huxley, MD;  Location: [place] CV LAB;  Service: Cardiovascular;  Laterality: N/A;  . ESOPHAGOGASTRODUODENOSCOPY (EGD) WITH PROPOFOL N/A 09/15/2017   Procedure: ESOPHAGOGASTRODUODENOSCOPY (EGD) WITH PROPOFOL;  Surgeon: Lucilla Lame, MD;  Location: [facility] ENDOSCOPY;  Service: Endoscopy;  Laterality: N/A;  . FRACTURE SURGERY Right 03/19/2015   wrist  . HIATAL HERNIA REPAIR    . ILIAC ARTERY STENT  12/15/2005   PTA and direct stenting rgt and lft common iliac arteries  . KYPHOPLASTY N/A 04/05/2019   Procedure: KYPHOPLASTY T12, L3, L4 L5;  Surgeon: Hessie Knows, MD;  Location: [facility] ORS;  Service: Orthopedics;  Laterality: N/A;  . NM GATED MYOVIEW ([place] HX)  12/2017   Low Risk - no ischemia or infarct.  EF > 65%. no RWMA  . NM MYOVIEW ([place] HX)  11/11/2010; February 2016   a) TM STRESS----NORMAL PERFUSION ,EF 68%; b) EF 50-55%. Normal LV function. No significant ischemia or infarction.  . OPEN REDUCTION INTERNAL FIXATION (ORIF) DISTAL RADIAL FRACTURE Left 01/13/2019   Procedure: OPEN REDUCTION INTERNAL FIXATION (ORIF) DISTAL  RADIAL FRACTURE - LEFT - SLEEP APNEA;  Surgeon: Hessie Knows, MD;  Location: [facility] ORS;  Service: Orthopedics;  Laterality: Left;  . ORIF ELBOW FRACTURE Right 01/01/2017   Procedure: OPEN REDUCTION INTERNAL FIXATION (ORIF) ELBOW/OLECRANON FRACTURE;  Surgeon: Hessie Knows, MD;  Location: [facility] ORS;  Service: Orthopedics;  Laterality: Right;  . ORIF WRIST FRACTURE Right 03/19/2015   Procedure: OPEN REDUCTION INTERNAL FIXATION (ORIF) WRIST FRACTURE;  Surgeon: Hessie Knows, MD;  Location: [facility] ORS;  Service: Orthopedics;  Laterality: Right;  . THORACIC AORTA - CAROTID ANGIOGRAM  October 2007   Dr. 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 TRANSTHORACIC ECHOCARDIOGRAM  February  2016   [facility]: Normal LV function. Dilated left atrium.    There were no vitals filed for this visit.  Subjective Assessment - 07/01/19 0903    Subjective  Patient reported that he fell last night, he tripped over his throw rug at his front door, has never tripped over this rug before. Stated he hurt his R shoulder/arm, denied hitting his head.    Pertinent History  Patient referred to physical therapy for balance/strength issues s/p abdominal aortic aneurysm (AAA) without rupture repair on 05/18/19 .  Patient has been seen by this therapist in the past (last seen in march of 2020) Patient had a fall on 01/08/19, [facility] report acute compression fractures at T12, L3 and L4 as well as facet arthropathy at the lower lumbar levels and at L3-4 moderate central stenosis. Patient underwent injection on 10/6 at L4-5 and  L5-S1. PMH includes AAA, arthritis, atherosclerotic heart disease, (s/p CABG), benign neoplasm of ascending/descending colon, COPD, CAD, dementia, dysrhythmia, frequent falls, GERD, hemorrhoid, HOH, HTN, PAF, prostate cancer. Monday through Friday have 3 people come in different times from 730 to 12pm. Uses a walker occasionally now    Limitations  Walking;Standing;House hold activities;Other (comment);Lifting    How long can you sit comfortably?  20 minutes before back is painful    How long can you stand comfortably?  hour    How long can you walk comfortably?  using a walker about 30 minutes    Patient Stated Goals  patient would like to improve his balance and strenght.    Currently in Pain?  Yes    Pain Score  5     Pain Location  Back    Pain Orientation  Lower    Pain Descriptors / Indicators  Tender    Pain Type  Acute pain       TREATMENT: Nustep Lvl 3 RPM> 60 for cardiovascular challenge 4 minutes Pt provided with hot pack during seated exercises to address LBP, pt reported improvement in pain afterwards.    Neuromuscular Re-education: verbal cueing for  task orientation and body mechanics for stability.    Ambulate without an AD with CGA x 2 for 52f, occasional lateral LOB noted.  Ambulation with heel strike and arm swing cues 787f with fair carry over, challenged by multitasking Head turns horizontal, vertical [id number] direction CGA-MIN, gait deviations/path deviations noted Lateral side stepping [id number] side x2 reps each    Opposite UE/LE raises marching in seated position; very challenging requires max cueing             x10 without alternating  x10 attempts with alternating, successful twice  alternating toe taps x15  Toe taps forward and lateral x10 ea, then alternating feet xc10   Therex:  Seated hip adduction x20  Seated LAQ with ball squeezes prior to ea lift to improve coordination x10 and 4# AW Seated marching with 4# AW   Vitals monitored throughout session during rest periods to ensure O2 saturation return to functional range prior to initiation of each exercise     Pt educated throughout session about proper posture and technique with exercises. Improved exercise technique, movement at target joints, use of target muscles after mod verbal, visual, tactile cues.   Pt response/clinical impression: Session modified for patient fatigue and pain this session after fall last night. Patient still most challenged by coordinating alternating movements, most successful with step by step sequencing with tactile cues. The patient would benefit from further skilled PT intervention to maximize safety, independence, and to decrease risk of falls. Pt somewhat agreeable to remove throw rug at the door.     PT Education - 07/01/19 0905    Education provided  Yes    Education Details  exercise technique, gait mechanics    Person(s) Educated  Patient    Methods  Explanation;Demonstration;Tactile cues;Verbal cues    Comprehension  Verbalized understanding;Returned demonstration;Verbal cues required;Tactile cues required       PT  Short Term Goals - 06/17/19 1228      PT SHORT TERM GOAL #1   Title  Patient will be independent in home exercise program to improve strength/mobility for better functional independence with ADLs.    Baseline  10/16: HEP given    Time  4    Period  Weeks    Status  New  Target Date  07/15/19      PT SHORT TERM GOAL #2   Title  Patient will deny any falls over past 4 weeks to demonstrate improved safety awareness at home and work.    Baseline  10/16: one major fall    Time  4    Period  Weeks    Status  New    Target Date  07/15/19        PT Long Term Goals - 06/17/19 1229      PT LONG TERM GOAL #1   Title  Patient will increase BLE gross strength to 4+/5 as to improve functional strength for independent gait, increased standing tolerance and increased ADL ability.    Baseline  10/16: R gross 4-/5 with add/ext 3+/5 L grossly 3+/5 with hip add/ext 2+/5    Time  8    Period  Weeks    Status  New    Target Date  08/12/19      PT LONG TERM GOAL #2   Title  Patient (> 82 years old) will complete five times sit to stand test in < 15 seconds indicating an increased LE strength and improved balance.    Baseline  10/16: 17 seconds with heavy use of hands on knees    Time  8    Period  Weeks    Status  New    Target Date  08/12/19      PT LONG TERM GOAL #3   Title  Patient will increase Berg Balance score by > 6 points (43/56)  to demonstrate decreased fall risk during functional activities.    Baseline  10/16: 37/56    Time  8    Period  Weeks    Status  New    Target Date  08/12/19      PT LONG TERM GOAL #4   Title  Patient will increase 10 meter walk test to >1.37ms as to improve gait speed for better community ambulation and to reduce fall risk.    Baseline  10/16: 0.91 w/o AD    Time  8    Period  Weeks    Status  New    Target Date  08/12/19      PT LONG TERM GOAL #5   Title  Patient will increase ABC scale score >80% to demonstrate better functional mobility and  better confidence with ADLs.    Baseline  10/16; 71%    Time  8    Period  Weeks    Status  New    Target Date  08/12/19            Plan - 07/01/19 0906    Clinical Impression Statement  Session modified for patient fatigue and pain this session after fall last night. Patient still most challenged by coordinating alternating movements, most successful with step by step sequencing with tactile cues. The patient would benefit from further skilled PT intervention to maximize safety, independence, and to decrease risk of falls. Pt somewhat agreeable to remove throw rug at the door    Personal Factors and Comorbidities  Age;Comorbidity 3+;Fitness;Past/Current Experience;Sex;Social Background;Time since onset of injury/illness/exacerbation;Transportation    Comorbidities  AAA, arthritis, atherosclerotic heart disease, (s/p CABG), benign neoplasm of ascending/descending colon, COPD, CAD, dementia, dysrhythmia, frequent falls, GERD, hemorrhoid, HOH, HTN, PAF, prostate cancer.    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  Evolving/Moderate complexity    Rehab Potential  Fair    Clinical Impairments Affecting Rehab Potential  (+) good support system, high prior level of function (-) currently undergoing radiation therapy for prostate cancer, memory deficits     PT Frequency  2x / week    PT Duration  8 weeks    PT Treatment/Interventions  ADLs/Self Care Home Management;Aquatic Therapy;Cryotherapy;Moist Heat;Traction;Ultrasound;Therapeutic activities;Functional mobility training;Stair training;Gait training;DME Instruction;Therapeutic exercise;Balance training;Neuromuscular  re-education;Patient/family education;Manual techniques;Passive range of motion;Energy conservation;Vestibular;Taping    PT Next Visit Plan  progress dynamic stability    PT Home Exercise Plan  see above    Consulted and Agree with Plan of Care  Patient       Patient will benefit from skilled therapeutic intervention in order to improve the following deficits and impairments:  Abnormal gait, Cardiopulmonary status limiting activity, Decreased activity tolerance, Decreased balance, Decreased knowledge of precautions, Decreased endurance, Decreased coordination, Decreased cognition, Decreased knowledge of use of DME, Decreased mobility, Difficulty walking, Decreased safety awareness, Decreased strength, Impaired flexibility, Impaired perceived functional ability, Impaired sensation, Postural dysfunction, Improper body mechanics, Pain  Visit Diagnosis: Unsteadiness on feet  Muscle weakness (generalized)  Other abnormalities of gait and mobility  Difficulty in walking, not elsewhere classified     Problem List Patient Active Problem List   Diagnosis Date Noted  . Status post abdominal aortic aneurysm (AAA) repair 04/15/2019  . Candidiasis 02/22/2019  . Malignant neoplasm of descending colon ([place])   . Benign neoplasm of descending colon   . Benign neoplasm of transverse colon   . Do not intubate, cardiopulmonary resuscitation (CPR)-only code status 10/08/2018  . Prostate cancer ([place]) 02/25/2018  . Prostatic cyst 01/15/2018  . 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 ([place]) 09/15/2017  . Blood in stool   . Abnormal feces   . Stricture and stenosis of esophagus   . Mild cognitive impairment 08/18/2017  . Senile purpura ([place]) 04/29/2017  . Anemia 04/29/2017  . Frequent falls 04/29/2017  . Acute respiratory failure ([place]) 12/17/2016  . Simple chronic bronchitis ([place]) 12/17/2016  . Benign localized hyperplasia of prostate with urinary obstruction 07/15/2016  . Elevated PSA 07/15/2016  .  Incomplete emptying of bladder 07/15/2016  . Urge incontinence 07/15/2016  . Hypothyroidism 04/25/2016  . Macular degeneration 04/25/2016  . Erectile dysfunction due to arterial insufficiency   . Ischemic heart disease with chronotropic incompetence   . CN (constipation) 02/10/2015  . DD (diverticular disease) 02/10/2015  . Fatigue 02/10/2015  . Lichen planus 70/48/8891  . Restless leg 02/10/2015  . 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 B12 deficiency 02/10/2015  . COPD (chronic obstructive pulmonary disease) ([place]) 11/14/2014  . Post Inflammatory Lung Changes 11/14/2014  . PAF (paroxysmal atrial fibrillation) ([facility]) CHA2DS2-VASc = 4. AC = Eliquis   . Insomnia 12/09/2013  . OSA on CPAP   . Dyspnea on exertion - -essentially resolved with BB dose reduction & wgt loss 03/29/2013  . Overweight (BMI 25.0-29.9) -- Wgt back up 01/17/2013  . Atherosclerotic heart disease of native coronary artery without angina pectoris - s/p CABG, occluded SVG-D1; patent LIMA-LAD, SVG-OM, SVG- RCA   . Essential hypertension   . Hyperlipidemia with target LDL less than 70   . Aorto-iliac disease ([place])   . BCC (basal cell carcinoma), eyelid 01/03/2013  . Allergic rhinitis 08/17/2009  . Adaptation reaction 05/28/2009  . Acid reflux 05/28/2009  . Arthritis, degenerative 05/28/2009  . 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 Carotid artery occlusion without infarction 06/01/2006  . PAD (  peripheral artery disease) - bilateral common iliac stents 12/15/2005  . Aortoiliac occlusive disease ([place]) 11/30/2005  . Atherosclerotic heart disease of artery bypass graft 09/01/1997   Lieutenant Diego PT, DPT 11:51 AM,07/01/19 [phone]  [place] MAIN [facility] SERVICES [address] [place], [place], [address] Phone: [phone]   Fax:  [phone]  Name: [name] MRN: [id number] Date of Birth: [birth year]

## 2019-07-01 NOTE — Telephone Encounter (Signed)
Is this person on the DPR? If not we can't talk to them.

## 2019-07-01 NOTE — Telephone Encounter (Signed)
Pt's friend [name] called saying he was put on Fosamax yesterday for osteoporosis and she is concerned because he is on a lot of other medications.  She is wondering if this is ok to take with everything he is on.    CB#  [phone]  teri

## 2019-07-03 ENCOUNTER — Other Ambulatory Visit: Payer: Self-pay

## 2019-07-03 ENCOUNTER — Encounter: Payer: Self-pay | Admitting: Emergency Medicine

## 2019-07-03 ENCOUNTER — Emergency Department
Admission: EM | Admit: 2019-07-03 | Discharge: 2019-07-03 | Disposition: A | Discharge status: Home / Self Care | Payer: Medicare Other | Attending: Emergency Medicine | Admitting: Emergency Medicine

## 2019-07-03 ENCOUNTER — Emergency Department: Payer: Medicare Other

## 2019-07-03 DIAGNOSIS — Z7901 Long term (current) use of anticoagulants: Secondary | ICD-10-CM | POA: Diagnosis not present

## 2019-07-03 DIAGNOSIS — Z8659 Personal history of other mental and behavioral disorders: Secondary | ICD-10-CM | POA: Diagnosis not present

## 2019-07-03 DIAGNOSIS — I2510 Atherosclerotic heart disease of native coronary artery without angina pectoris: Secondary | ICD-10-CM | POA: Insufficient documentation

## 2019-07-03 DIAGNOSIS — I10 Essential (primary) hypertension: Secondary | ICD-10-CM | POA: Diagnosis not present

## 2019-07-03 DIAGNOSIS — E039 Hypothyroidism, unspecified: Secondary | ICD-10-CM | POA: Insufficient documentation

## 2019-07-03 DIAGNOSIS — J449 Chronic obstructive pulmonary disease, unspecified: Secondary | ICD-10-CM | POA: Insufficient documentation

## 2019-07-03 DIAGNOSIS — Z87891 Personal history of nicotine dependence: Secondary | ICD-10-CM | POA: Diagnosis not present

## 2019-07-03 DIAGNOSIS — Z951 Presence of aortocoronary bypass graft: Secondary | ICD-10-CM | POA: Diagnosis not present

## 2019-07-03 DIAGNOSIS — R4182 Altered mental status, unspecified: Secondary | ICD-10-CM | POA: Diagnosis present

## 2019-07-03 DIAGNOSIS — Z79899 Other long term (current) drug therapy: Secondary | ICD-10-CM | POA: Diagnosis not present

## 2019-07-03 DIAGNOSIS — R4020 Unspecified coma: Secondary | ICD-10-CM | POA: Diagnosis not present

## 2019-07-03 DIAGNOSIS — R918 Other nonspecific abnormal finding of lung field: Secondary | ICD-10-CM | POA: Diagnosis not present

## 2019-07-03 DIAGNOSIS — R4781 Slurred speech: Secondary | ICD-10-CM | POA: Diagnosis not present

## 2019-07-03 DIAGNOSIS — R410 Disorientation, unspecified: Secondary | ICD-10-CM | POA: Insufficient documentation

## 2019-07-03 DIAGNOSIS — F0390 Unspecified dementia without behavioral disturbance: Secondary | ICD-10-CM | POA: Diagnosis not present

## 2019-07-03 LAB — COMPREHENSIVE METABOLIC PANEL
ALT: 12 U/L (ref 0–44)
AST: 13 U/L — ABNORMAL LOW (ref 15–41)
Albumin: 3.6 g/dL (ref 3.5–5.0)
Alkaline Phosphatase: 86 U/L (ref 38–126)
Anion gap: 11 (ref 5–15)
BUN: 18 mg/dL (ref 8–23)
CO2: 22 mmol/L (ref 22–32)
Calcium: 9 mg/dL (ref 8.9–10.3)
Chloride: 106 mmol/L (ref 98–111)
Creatinine, Ser: 0.84 mg/dL (ref 0.61–1.24)
GFR calc Af Amer: >60 mL/min (ref >60)
GFR calc non Af Amer: >60 mL/min (ref >60)
Glucose, Bld: 96 mg/dL (ref 70–99)
Potassium: 4.1 mmol/L (ref 3.5–5.1)
Sodium: 139 mmol/L (ref 135–145)
Total Bilirubin: 1.2 mg/dL (ref 0.3–1.2)
Total Protein: 6.3 g/dL — ABNORMAL LOW (ref 6.5–8.1)

## 2019-07-03 LAB — CBC
HCT: 34.7 % — ABNORMAL LOW (ref 39.0–52.0)
Hemoglobin: 11 g/dL — ABNORMAL LOW (ref 13.0–17.0)
MCH: 30.4 pg (ref 26.0–34.0)
MCHC: 31.7 g/dL (ref 30.0–36.0)
MCV: 95.9 fL (ref 80.0–100.0)
Platelets: 152 10*3/uL (ref 150–400)
RBC: 3.62 MIL/uL — ABNORMAL LOW (ref 4.22–5.81)
RDW: 14.1 % (ref 11.5–15.5)
WBC: 7.3 10*3/uL (ref 4.0–10.5)
nRBC: 0 % (ref 0.0–0.2)

## 2019-07-03 LAB — URINALYSIS, COMPLETE (UACMP) WITH MICROSCOPIC
Bacteria, UA: NONE SEEN
Bilirubin Urine: NEGATIVE
Glucose, UA: NEGATIVE mg/dL
Hgb urine dipstick: NEGATIVE
Ketones, ur: NEGATIVE mg/dL
Leukocytes,Ua: NEGATIVE
Nitrite: NEGATIVE
Protein, ur: NEGATIVE mg/dL
Specific Gravity, Urine: 1.021 (ref 1.005–1.030)
Squamous Epithelial / HPF: NONE SEEN (ref 0–5)
pH: 5 (ref 5.0–8.0)

## 2019-07-03 LAB — TROPONIN I (HIGH SENSITIVITY)
Troponin I (High Sensitivity): 6 ng/L (ref <18)
Troponin I (High Sensitivity): 6 ng/L (ref <18)

## 2019-07-03 LAB — GLUCOSE, CAPILLARY: Glucose-Capillary: 89 mg/dL (ref 70–99)

## 2019-07-03 MED ORDER — SODIUM CHLORIDE 0.9% FLUSH: 3.0000 mL | Freq: Once | INTRAVENOUS | Status: DC

## 2019-07-03 NOTE — ED Notes (Signed)
Pt and visitor repeatedly asking when pt will be discharged.Pt and visitor reassured that staff is running all necessary tests to care for pt.

## 2019-07-03 NOTE — ED Triage Notes (Signed)
Pt to ED via POV with significant other who states that pt had an episode of episode of AMS this morning. Pt SO states that pt called her this morning around 0300 wanting to know why she was not there to pick him up, pt told her that he was dressed and waiting on her to come pick him up. Pt SO got him to lay back down. Pt again called SO at 0700 asking her why she had him committed, [facility] Pt SO went to the house and states that pt was still confused, he kept asking her why the room he was in looked identical to his room at his house. Pt SO states that when pt called at 0300 he did have some slurred speech. Speech is clear at this time.   Pt SO states that he has had similar episodes to this in the past but she was able to talk him out of it pretty quickly, pt SO states that this episode seemed to last a little longer. Pt is currently on Aricept. Pt SO states that they have been giving him the medication at night but a few days ago they switched it and started giving it to him during the day. Pt was also started on a medication for RLS as well.  Pt alert to person, place, and situation. Pt is a little confused to time. When asked about year pt first stated 2012, then 2019, then 2020. Pt able to tell RN that it is Sunday.

## 2019-07-03 NOTE — ED Notes (Signed)
Pt refused wheelchair, states he has cane. Pt with steady gait. Pt ambullating to lobby.

## 2019-07-03 NOTE — ED Provider Notes (Signed)
[facility] Emergency Department Provider Note   ____________________________________________   First MD Initiated Contact with Patient 07/03/19 [phone]     (approximate)  I have reviewed the triage vital signs and the nursing notes.   HISTORY  Chief Complaint Altered Mental Status    HPI [name] is a 85 y.o. male with vesicle history of dementia, AAA status post repair, atrial fibrillation, and COPD who presents to the ED for altered mental status.  Patient is accompanied by his longtime significant other, who provides the majority of the history.  She states that he called her around 3 AM this morning stating that he was ready to be picked up.  She went to visit him and was able to get him back in bed, but when he woke up later this morning again called her very confused.  He believes that he was committed to the [facility] from when he was in the [employer] many years ago.  Patient's girlfriend states he has had episodes like this in the past, but they have not been this severe or lasted this long.  She now states that he seems to be improving and is returning to his baseline mental status.  He has otherwise recently been well and denies any fevers, chills, cough, chest pain, or shortness of breath.        Past Medical History:  Diagnosis Date  . AAA (abdominal aortic aneurysm) ([place])   . Arthritis   . Atherosclerotic heart disease of native coronary artery without angina pectoris 1998   - s/p CABG, occluded SVG-D1; patent LIMA-LAD, SVG-OM, SVG- RCA  . Basal cell carcinoma of eyelid 01/03/2013   2019 - R side of Nose  . Benign neoplasm of ascending colon   . Benign neoplasm of descending colon   . Cancer [facility])    Skin cancer  . Colon polyps   . COPD (chronic obstructive pulmonary disease) ([place])   . Coronary artery disease   . Dementia ([place])   . Dysrhythmia    atrial fib  . Falls frequently   . GERD (gastroesophageal reflux  disease)   . Hemorrhoid 02/10/2015  . History of hiatal hernia   . History of kidney stones   . HOH (hard of hearing)   . Hypertension   . Memory loss   . Osteoporosis   . PAF (paroxysmal atrial fibrillation) ([place])   . Prostate cancer ([place])   . Prostate disorder   . Sciatic pain    Chronic  . Sleep apnea    cpap  . Temporary cerebral vascular dysfunction 02/10/2015   Had negative work-up.  July, 2012.  No medication changes, had forgot them that day, got dehydrated.   . Urinary incontinence     Patient Active Problem List   Diagnosis Date Noted  . Status post abdominal aortic aneurysm (AAA) repair 04/15/2019  . Candidiasis 02/22/2019  . Malignant neoplasm of descending colon ([place])   . Benign neoplasm of descending colon   . Benign neoplasm of transverse colon   . Do not intubate, cardiopulmonary resuscitation (CPR)-only code status 10/08/2018  . Prostate cancer ([place]) 02/25/2018  . Prostatic cyst 01/15/2018  . 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 ([place]) 09/15/2017  . Blood in stool   . Abnormal feces   . Stricture and stenosis of esophagus   . Mild cognitive impairment 08/18/2017  . Senile purpura ([place]) 04/29/2017  . Anemia 04/29/2017  . Frequent falls 04/29/2017  . Acute respiratory failure ([place])  12/17/2016  . Simple chronic bronchitis ([place]) 12/17/2016  . Benign localized hyperplasia of prostate with urinary obstruction 07/15/2016  . Elevated PSA 07/15/2016  . Incomplete emptying of bladder 07/15/2016  . Urge incontinence 07/15/2016  . Hypothyroidism 04/25/2016  . Macular degeneration 04/25/2016  . Erectile dysfunction due to arterial insufficiency   . Ischemic heart disease with chronotropic incompetence   . CN (constipation) 02/10/2015  . DD (diverticular disease) 02/10/2015  . Fatigue 02/10/2015  . Lichen planus 73/71/0626  . Restless leg 02/10/2015  . 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 B12 deficiency 02/10/2015  . COPD (chronic obstructive pulmonary disease)  ([place]) 11/14/2014  . Post Inflammatory Lung Changes 11/14/2014  . PAF (paroxysmal atrial fibrillation) (H[facility]) CHA2DS2-VASc = 4. AC = Eliquis   . Insomnia 12/09/2013  . OSA on CPAP   . Dyspnea on exertion - -essentially resolved with BB dose reduction & wgt loss 03/29/2013  . Overweight (BMI 25.0-29.9) -- Wgt back up 01/17/2013  . Atherosclerotic heart disease of native coronary artery without angina pectoris - s/p CABG, occluded SVG-D1; patent LIMA-LAD, SVG-OM, SVG- RCA   . Essential hypertension   . Hyperlipidemia with target LDL less than 70   . Aorto-iliac disease ([place])   . BCC (basal cell carcinoma), eyelid 01/03/2013  . Allergic rhinitis 08/17/2009  . Adaptation reaction 05/28/2009  . Acid reflux 05/28/2009  . Arthritis, degenerative 05/28/2009  . 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 Carotid artery occlusion without infarction 06/01/2006  . PAD (peripheral artery disease) - bilateral common iliac stents 12/15/2005  . Aortoiliac occlusive disease ([place]) 11/30/2005  . Atherosclerotic heart disease of artery bypass graft 09/01/1997    Past Surgical History:  Procedure Laterality Date  . ABDOMINAL AORTIC ENDOVASCULAR STENT GRAFT Bilateral 05/18/2019   Procedure: ABDOMINAL AORTIC ENDOVASCULAR STENT GRAFT;  Surgeon: Algernon Huxley, MD;  Location: [facility] ORS;  Service: Vascular;  Laterality: Bilateral;  . ANKLE SURGERY    . Arch aortogram and carotid aortogram  06/02/2006   Dr Oneida Alar did surgery  . BASAL CELL CARCINOMA EXCISION  04/2016   Dermatology  . CARDIAC CATHETERIZATION  01/30/1998    (Dr. Linard Millers): Native LAD & mRCA CTO. 90% D1. Mod Cx. SVG-D1 CTO. SVG-RCA & SVG-OM along with LIMA-LAD patent;  LV NORMAL Fxn  . CAROTID ENDARTERECTOMY Left Oct. 29, 2007   Dr. Oneida Alar:  . CATARACT EXTRACTION W/PHACO Right 03/05/2017   Procedure: CATARACT EXTRACTION PHACO AND INTRAOCULAR LENS PLACEMENT (IOC);  Surgeon: Leandrew Koyanagi, MD;  Location: [facility] ORS;  Service: Ophthalmology;   Laterality: Right;  [place] 00:58.5 AP% 10.6 CDE 6.20 Fluid Pack lot # [id number]  . CATARACT EXTRACTION W/PHACO Left 04/15/2017   Procedure: CATARACT EXTRACTION PHACO AND INTRAOCULAR LENS PLACEMENT ([place])  Left;  Surgeon: Leandrew Koyanagi, MD;  Location: [place];  Service: Ophthalmology;  Laterality: Left;  . COLONOSCOPY WITH PROPOFOL N/A 09/15/2017   Procedure: COLONOSCOPY WITH PROPOFOL;  Surgeon: Lucilla Lame, MD;  Location: [facility] ENDOSCOPY;  Service: Endoscopy;  Laterality: N/A;  . COLONOSCOPY WITH PROPOFOL N/A 10/26/2018   Procedure: COLONOSCOPY WITH PROPOFOL;  Surgeon: Lucilla Lame, MD;  Location: [facility] ENDOSCOPY;  Service: Endoscopy;  Laterality: N/A;  . CORONARY ARTERY BYPASS GRAFT  1998   LIMA-LAD, SVG-OM, SVG-RPDA, SVG-DIAG  . CPET / MET  12/2012   Mild chronotropic incompetence - read 82% of predicted; also reduced effort; peak VO2 15.7 / 75% (did not reach Max effort) -- suggested ischemic response in last 1.5 minutes of exercise. Normal pulmonary function on  PFTs but poor response to  . DOPPLER ECHOCARDIOGRAPHY  11/20/2010   EF =>55%; LV norm mild aortic scelorosis  . ENDOVASCULAR REPAIR/STENT GRAFT N/A 05/18/2019   Procedure: ENDOVASCULAR REPAIR/STENT GRAFT;  Surgeon: Algernon Huxley, MD;  Location: [place] CV LAB;  Service: Cardiovascular;  Laterality: N/A;  . ESOPHAGOGASTRODUODENOSCOPY (EGD) WITH PROPOFOL N/A 09/15/2017   Procedure: ESOPHAGOGASTRODUODENOSCOPY (EGD) WITH PROPOFOL;  Surgeon: Lucilla Lame, MD;  Location: [facility] ENDOSCOPY;  Service: Endoscopy;  Laterality: N/A;  . FRACTURE SURGERY Right 03/19/2015   wrist  . HIATAL HERNIA REPAIR    . ILIAC ARTERY STENT  12/15/2005   PTA and direct stenting rgt and lft common iliac arteries  . KYPHOPLASTY N/A 04/05/2019   Procedure: KYPHOPLASTY T12, L3, L4 L5;  Surgeon: Hessie Knows, MD;  Location: [facility] ORS;  Service: Orthopedics;  Laterality: N/A;  . NM GATED MYOVIEW ([place] [facility])  12/2017   Low Risk - no ischemia or infarct.  EF >  65%. no RWMA  . NM MYOVIEW ([place] [facility]X)  11/11/2010; February 2016   a) TM STRESS----NORMAL PERFUSION ,EF 68%; b) EF 50-55%. Normal LV function. No significant ischemia or infarction.  . OPEN REDUCTION INTERNAL FIXATION (ORIF) DISTAL RADIAL FRACTURE Left 01/13/2019   Procedure: OPEN REDUCTION INTERNAL FIXATION (ORIF) DISTAL  RADIAL FRACTURE - LEFT - SLEEP APNEA;  Surgeon: Hessie Knows, MD;  Location: [facility] ORS;  Service: Orthopedics;  Laterality: Left;  . ORIF ELBOW FRACTURE Right 01/01/2017   Procedure: OPEN REDUCTION INTERNAL FIXATION (ORIF) ELBOW/OLECRANON FRACTURE;  Surgeon: Hessie Knows, MD;  Location: [facility] ORS;  Service: Orthopedics;  Laterality: Right;  . ORIF WRIST FRACTURE Right 03/19/2015   Procedure: OPEN REDUCTION INTERNAL FIXATION (ORIF) WRIST FRACTURE;  Surgeon: Hessie Knows, MD;  Location: [facility] ORS;  Service: Orthopedics;  Laterality: Right;  . THORACIC AORTA - CAROTID ANGIOGRAM  October 2007   Dr. 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 TRANSTHORACIC ECHOCARDIOGRAM  February 2016   [facility]: Normal LV function. Dilated left atrium.    Prior to Admission medications   Medication Sig Start Date End Date Taking? Authorizing Provider  acetaminophen (TYLENOL) 500 MG tablet Take 500-1,000 mg by mouth every 6 (six) hours as needed for mild pain or moderate pain.     [provider]  ADVAIR DISKUS 250-50 MCG/DOSE AEPB INHALE ONE PUFF BY MOUTH TWICE DAILY AS DIRECTED (RINSE MOUTH AFTER USE) Patient taking differently: Inhale 1 puff into the lungs 2 (two) times a day.  02/14/19   Laverle Hobby, MD  alendronate (FOSAMAX) 70 MG tablet Take 1 tablet (70 mg total) by mouth once a week. Take with a full glass of water on an empty stomach. 06/30/19   [name], MD  Ascorbic Acid (VITAMIN C) 1000 MG tablet Take 1,000 mg by mouth daily.    [provider]  Cyanocobalamin (VITAMIN B-12)  500 MCG SUBL Place 500 mcg under the tongue daily.     [provider]  donepezil (ARICEPT) 10 MG tablet Take 10 mg by mouth at bedtime.  11/18/17   [provider]  ELIQUIS 5 MG TABS tablet TAKE 1 TABLET BY MOUTH TWICE DAILY Patient taking differently: Take 5 mg by mouth 2 (two) times daily.  01/14/19   Leonie Man, MD  ezetimibe (ZETIA) 10 MG tablet Take 1 tablet (10 mg total) by mouth daily. Patient taking differently: Take 10 mg by mouth at bedtime.  04/19/19   Fisher,  Kirstie Peri, MD  finasteride (PROSCAR) 5 MG tablet Take 1 tablet (5 mg total) by mouth daily. 01/31/19   Stoioff, Ronda Fairly, MD  fluticasone (FLONASE) 50 MCG/ACT nasal spray Place 2 sprays into both nostrils daily as needed for allergies. 11/22/18   [name], MD  lisinopril (PRINIVIL,ZESTRIL) 2.5 MG tablet TAKE ONE TABLET BY MOUTH TWICE DAILY Patient taking differently: Take 2.5 mg by mouth 2 (two) times a day.  10/19/18   [name], MD  Magnesium Oxide 500 MG TABS Take 500 mg by mouth every evening.    [provider]  metoprolol tartrate (LOPRESSOR) 25 MG tablet Take 1 tablet (25 mg total) by mouth 2 (two) times daily. *OFFICE VISIT NEEDED FOR FURTHER REFILLS* 05/06/19   Leonie Man, MD  mirabegron ER (MYRBETRIQ) 50 MG TB24 tablet Take 1 tablet (50 mg total) by mouth daily. Patient taking differently: Take 50 mg by mouth every evening.  12/15/18   Stoioff, Ronda Fairly, MD  Multiple Vitamins-Minerals (PRESERVISION AREDS 2 PO) Take 1 tablet by mouth 2 (two) times daily.     [provider]  nitroGLYCERIN (NITROSTAT) 0.4 MG SL tablet Place 1 tablet (0.4 mg total) under the tongue every 5 (five) minutes as needed for chest pain. 01/12/17   [name], MD  NON FORMULARY Swish and spit 5 mLs at bedtime. Doxycycline/dexamethasone/nystatin/diphenhydramine compounded mouth elixir    [provider]  pantoprazole (PROTONIX) 40 MG tablet TAKE ONE TABLET BY MOUTH EVERY DAY 05/29/19    [name], MD  rosuvastatin (CRESTOR) 20 MG tablet TAKE ONE TABLET EVERY DAY Patient taking differently: Take 20 mg by mouth every evening.  09/10/18   [name], MD  tamsulosin (FLOMAX) 0.4 MG CAPS capsule Take 1 capsule (0.4 mg total) by mouth every evening. 06/07/19   Stoioff, Ronda Fairly, MD    Allergies Clonazepam, Codeine, Niacin and related, Tramadol, Hydrocodone, and Oxycodone  Family History  Problem Relation Age of Onset  . Ulcers Mother        Peptic  . Dementia Mother   . Alcohol abuse Father     Social History Social History   Tobacco Use  . Smoking status: Former Smoker    Types: Cigarettes    Quit date: 08/04/1997    Years since quitting: 21.9  . Smokeless tobacco: Never Used  Substance Use Topics  . Alcohol use: No  . Drug use: No    Review of Systems  Constitutional: No fever/chills Eyes: No visual changes. ENT: No sore throat. Cardiovascular: Denies chest pain. Respiratory: Denies shortness of breath. Gastrointestinal: No abdominal pain.  No nausea, no vomiting.  No diarrhea.  No constipation. Genitourinary: Negative for dysuria. Musculoskeletal: Negative for back pain. Skin: Negative for rash. Neurological: Negative for headaches, focal weakness or numbness.  Positive for confusion.  ____________________________________________   PHYSICAL EXAM:  VITAL SIGNS: ED Triage Vitals  Enc Vitals Group     BP 07/03/19 0834 (!) 95/56     Pulse Rate 07/03/19 0834 65     Resp 07/03/19 0834 16     Temp 07/03/19 0834 98.8 F (37.1 C)     Temp Source 07/03/19 0834 Oral     SpO2 07/03/19 0834 97 %     Weight 07/03/19 0835 190 lb (86.2 kg)     Height 07/03/19 0835 '5\' 10"'  (1.778 m)     Head Circumference --      Peak Flow --      Pain  Score 07/03/19 0834 0     Pain Loc --      Pain Edu? --      Excl. in [place]? --     Constitutional: Alert and oriented to person place and time. Eyes: Conjunctivae are normal. Head: Atraumatic. Nose: No  congestion/rhinnorhea. Mouth/Throat: Mucous membranes are moist. Neck: Normal ROM Cardiovascular: Normal rate, regular rhythm. Grossly normal heart sounds. Respiratory: Normal respiratory effort.  No retractions. Lungs CTAB. Gastrointestinal: Soft and nontender. No distention. Genitourinary: deferred Musculoskeletal: No lower extremity tenderness nor edema. Neurologic:  Normal speech and language. No gross focal neurologic deficits are appreciated. Skin:  Skin is warm, dry and intact. No rash noted. Psychiatric: Mood and affect are normal. Speech and behavior are normal.  ____________________________________________   LABS (all labs ordered are listed, but only abnormal results are displayed)  Labs Reviewed  COMPREHENSIVE METABOLIC PANEL - Abnormal; Notable for the following components:      Result Value   Total Protein 6.3 (*)    AST 13 (*)    All other components within normal limits  CBC - Abnormal; Notable for the following components:   RBC 3.62 (*)    Hemoglobin 11.0 (*)    HCT 34.7 (*)    All other components within normal limits  URINALYSIS, COMPLETE (UACMP) WITH MICROSCOPIC - Abnormal; Notable for the following components:   Color, Urine YELLOW (*)    APPearance CLEAR (*)    All other components within normal limits  GLUCOSE, CAPILLARY  CBG MONITORING, ED  TROPONIN I (HIGH SENSITIVITY)  TROPONIN I (HIGH SENSITIVITY)   ____________________________________________  EKG  ED ECG REPORT I, Blake Divine, the attending physician, personally viewed and interpreted this ECG.   Date: 07/03/2019  EKG Time: 21:43  Rate: 59  Rhythm: normal sinus rhythm  Axis: Normal  Intervals:right bundle branch block  ST&T Change: None   PROCEDURES  Procedure(s) performed (including Critical Care):  Procedures   ____________________________________________   INITIAL IMPRESSION / ASSESSMENT AND PLAN / ED COURSE       85 year old male with history of dementia presents  to the ED following episode of confusion and altered mental status earlier this morning.  Per significant other, patient was very confused and hallucinating earlier this morning, but has now returned to his baseline.  He has no focal neurologic deficits and CT head is negative for acute process.  No evidence for infectious process, chest x-ray clear and UA negative.  Remainder of labs are unremarkable.  Significant other now stating that patient recently started on Requip for restless leg syndrome, which could potentially contribute to confusion and hallucinations.  Given symptoms have now resolved and work-up is reassuring, he is appropriate for follow-up as an outpatient.  Counseled to return to the ED for new or worsening symptoms, patient agrees with plan.      ____________________________________________   FINAL CLINICAL IMPRESSION(S) / ED DIAGNOSES  Final diagnoses:  Confusion  History of dementia     ED Discharge Orders    None       Note:  This document was prepared using Dragon voice recognition software and may include unintentional dictation errors.   Blake Divine, MD 07/03/19 1314

## 2019-07-03 NOTE — ED Notes (Signed)
Patient transported to CT 

## 2019-07-04 NOTE — Telephone Encounter (Signed)
[name] is not on patient's DPR.

## 2019-07-05 ENCOUNTER — Other Ambulatory Visit: Payer: Self-pay

## 2019-07-05 ENCOUNTER — Ambulatory Visit: Payer: Medicare Other | Attending: Nurse Practitioner

## 2019-07-05 DIAGNOSIS — R2689 Other abnormalities of gait and mobility: Secondary | ICD-10-CM | POA: Insufficient documentation

## 2019-07-05 DIAGNOSIS — M6281 Muscle weakness (generalized): Secondary | ICD-10-CM | POA: Diagnosis not present

## 2019-07-05 DIAGNOSIS — R262 Difficulty in walking, not elsewhere classified: Secondary | ICD-10-CM | POA: Insufficient documentation

## 2019-07-05 DIAGNOSIS — R2681 Unsteadiness on feet: Secondary | ICD-10-CM | POA: Diagnosis not present

## 2019-07-05 NOTE — Therapy (Signed)
[place] MAIN [facility] SERVICES [address] [place], [place], [address] Phone: [phone]   Fax:  [phone]  Physical Therapy Treatment  Patient Details  Name: [name] MRN: [id number] Date of Birth: 02/12/1934 Referring Provider (PT): Eulogio Ditch   Encounter Date: 07/05/2019  PT End of Session - 07/05/19 1020    Visit Number  6    Number of Visits  16    Date for PT Re-Evaluation  08/12/19    Authorization Type  6/10 eval 10/16    PT Start Time  0940    PT Stop Time  1015    PT Time Calculation (min)  35 min    Equipment Utilized During Treatment  Gait belt    Activity Tolerance  Patient tolerated treatment well    Behavior During Therapy  [facility] for tasks assessed/performed       Past Medical History:  Diagnosis Date  . AAA (abdominal aortic aneurysm) ([place])   . Arthritis   . Atherosclerotic heart disease of native coronary artery without angina pectoris 1998   - s/p CABG, occluded SVG-D1; patent LIMA-LAD, SVG-OM, SVG- RCA  . Basal cell carcinoma of eyelid 01/03/2013   2019 - R side of Nose  . Benign neoplasm of ascending colon   . Benign neoplasm of descending colon   . Cancer [facility])    Skin cancer  . Colon polyps   . COPD (chronic obstructive pulmonary disease) ([place])   . Coronary artery disease   . Dementia ([place])   . Dysrhythmia    atrial fib  . Falls frequently   . GERD (gastroesophageal reflux disease)   . Hemorrhoid 02/10/2015  . History of hiatal hernia   . History of kidney stones   . HOH (hard of hearing)   . Hypertension   . Memory loss   . Osteoporosis   . PAF (paroxysmal atrial fibrillation) ([place])   . Prostate cancer ([place])   . Prostate disorder   . Sciatic pain    Chronic  . Sleep apnea    cpap  . Temporary cerebral vascular dysfunction 02/10/2015   Had negative work-up.  July, 2012.  No medication changes, had forgot them that day, got dehydrated.   . Urinary incontinence     Past Surgical  History:  Procedure Laterality Date  . ABDOMINAL AORTIC ENDOVASCULAR STENT GRAFT Bilateral 05/18/2019   Procedure: ABDOMINAL AORTIC ENDOVASCULAR STENT GRAFT;  Surgeon: Algernon Huxley, MD;  Location: [facility] ORS;  Service: Vascular;  Laterality: Bilateral;  . ANKLE SURGERY    . Arch aortogram and carotid aortogram  06/02/2006   Dr Oneida Alar did surgery  . BASAL CELL CARCINOMA EXCISION  04/2016   Dermatology  . CARDIAC CATHETERIZATION  01/30/1998    (Dr. Linard Millers): Native LAD & mRCA CTO. 90% D1. Mod Cx. SVG-D1 CTO. SVG-RCA & SVG-OM along with LIMA-LAD patent;  LV NORMAL Fxn  . CAROTID ENDARTERECTOMY Left Oct. 29, 2007   Dr. Oneida Alar:  . CATARACT EXTRACTION W/PHACO Right 03/05/2017   Procedure: CATARACT EXTRACTION PHACO AND INTRAOCULAR LENS PLACEMENT (IOC);  Surgeon: Leandrew Koyanagi, MD;  Location: [facility] ORS;  Service: Ophthalmology;  Laterality: Right;  [place] 00:58.5 AP% 10.6 CDE 6.20 Fluid Pack lot # [id number]  . CATARACT EXTRACTION W/PHACO Left 04/15/2017   Procedure: CATARACT EXTRACTION PHACO AND INTRAOCULAR LENS PLACEMENT ([place])  Left;  Surgeon: Leandrew Koyanagi, MD;  Location: [place];  Service: Ophthalmology;  Laterality: Left;  . COLONOSCOPY WITH PROPOFOL N/A 09/15/2017  Procedure: COLONOSCOPY WITH PROPOFOL;  Surgeon: Lucilla Lame, MD;  Location: [facility] ENDOSCOPY;  Service: Endoscopy;  Laterality: N/A;  . COLONOSCOPY WITH PROPOFOL N/A 10/26/2018   Procedure: COLONOSCOPY WITH PROPOFOL;  Surgeon: Lucilla Lame, MD;  Location: [facility] ENDOSCOPY;  Service: Endoscopy;  Laterality: N/A;  . CORONARY ARTERY BYPASS GRAFT  1998   LIMA-LAD, SVG-OM, SVG-RPDA, SVG-DIAG  . CPET / MET  12/2012   Mild chronotropic incompetence - read 82% of predicted; also reduced effort; peak VO2 15.7 / 75% (did not reach Max effort) -- suggested ischemic response in last 1.5 minutes of exercise. Normal pulmonary function on PFTs but poor response to  . DOPPLER ECHOCARDIOGRAPHY  11/20/2010   EF =>55%; LV norm mild aortic  scelorosis  . ENDOVASCULAR REPAIR/STENT GRAFT N/A 05/18/2019   Procedure: ENDOVASCULAR REPAIR/STENT GRAFT;  Surgeon: Algernon Huxley, MD;  Location: [place] CV LAB;  Service: Cardiovascular;  Laterality: N/A;  . ESOPHAGOGASTRODUODENOSCOPY (EGD) WITH PROPOFOL N/A 09/15/2017   Procedure: ESOPHAGOGASTRODUODENOSCOPY (EGD) WITH PROPOFOL;  Surgeon: Lucilla Lame, MD;  Location: [facility] ENDOSCOPY;  Service: Endoscopy;  Laterality: N/A;  . FRACTURE SURGERY Right 03/19/2015   wrist  . HIATAL HERNIA REPAIR    . ILIAC ARTERY STENT  12/15/2005   PTA and direct stenting rgt and lft common iliac arteries  . KYPHOPLASTY N/A 04/05/2019   Procedure: KYPHOPLASTY T12, L3, L4 L5;  Surgeon: Hessie Knows, MD;  Location: [facility] ORS;  Service: Orthopedics;  Laterality: N/A;  . NM GATED MYOVIEW ([place] [facility])  12/2017   Low Risk - no ischemia or infarct.  EF > 65%. no RWMA  . NM MYOVIEW ([place] [facility])  11/11/2010; February 2016   a) TM STRESS----NORMAL PERFUSION ,EF 68%; b) EF 50-55%. Normal LV function. No significant ischemia or infarction.  . OPEN REDUCTION INTERNAL FIXATION (ORIF) DISTAL RADIAL FRACTURE Left 01/13/2019   Procedure: OPEN REDUCTION INTERNAL FIXATION (ORIF) DISTAL  RADIAL FRACTURE - LEFT - SLEEP APNEA;  Surgeon: Hessie Knows, MD;  Location: [facility] ORS;  Service: Orthopedics;  Laterality: Left;  . ORIF ELBOW FRACTURE Right 01/01/2017   Procedure: OPEN REDUCTION INTERNAL FIXATION (ORIF) ELBOW/OLECRANON FRACTURE;  Surgeon: Hessie Knows, MD;  Location: [facility] ORS;  Service: Orthopedics;  Laterality: Right;  . ORIF WRIST FRACTURE Right 03/19/2015   Procedure: OPEN REDUCTION INTERNAL FIXATION (ORIF) WRIST FRACTURE;  Surgeon: Hessie Knows, MD;  Location: [facility] ORS;  Service: Orthopedics;  Laterality: Right;  . THORACIC AORTA - CAROTID ANGIOGRAM  October 2007   Dr. 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 TRANSTHORACIC ECHOCARDIOGRAM  February  2016   [facility]: Normal LV function. Dilated left atrium.    There were no vitals filed for this visit.  Subjective Assessment - 07/05/19 1017    Subjective  Patient stated he went to the ED since his last visit for "seeing things that weren't there". Tests were run, all negative, was not admitted. No falls since last PT visit.    Pertinent History  Patient referred to physical therapy for balance/strength issues s/p abdominal aortic aneurysm (AAA) without rupture repair on 05/18/19 .  Patient has been seen by this therapist in the past (last seen in march of 2020) Patient had a fall on 01/08/19, MRI from [facility] dated 03/28/19 report acute compression fractures at T12, L3 and L4 as well as facet arthropathy at the lower lumbar levels and at L3-4 moderate central stenosis. Patient underwent injection on 10/6 at L4-5 and L5-S1. PMH includes  AAA, arthritis, atherosclerotic heart disease, (s/p CABG), benign neoplasm of ascending/descending colon, COPD, CAD, dementia, dysrhythmia, frequent falls, GERD, hemorrhoid, HOH, HTN, PAF, prostate cancer. Monday through Friday have 3 people come in different times from 730 to 12pm. Uses a walker occasionally now    Limitations  Walking;Standing;House hold activities;Other (comment);Lifting    How long can you sit comfortably?  20 minutes before back is painful    How long can you stand comfortably?  hour    How long can you walk comfortably?  using a walker about 30 minutes    Patient Stated Goals  patient would like to improve his balance and strenght.    Currently in Pain?  No/denies       TREATMENT: PT assisted with new tennis balls for RW  Nustep Lvl 3 RPM> 60 for cardiovascular challenge 4 minutes    Neuromuscular Re-education: verbal cueing for task orientation and body mechanics for stability.     in //:   Tandem walking/backwards walking, CGA, multiple LOB noted, challenged with foot clearance and stride length with backwards walking x4 laps each   tandem  stance static, 3x30sec CGA-minA  Modified tandem with head turns vertical/horizontal 2x30sec hold ea foot in front, CGA  Therex:   Quantum leg press: bilateral legs: #135 15x, 2 sets, cueing for decreased velocity of movement for optimal muscle recruitment   Quantum leg press: single leg : #60 15x each LE, 2 sets cueing for decreased velocity for movement with optimal muscle recruitment.    Vitals monitored throughout session during rest periods to ensure O2 saturation return to functional range prior to initiation of each exercise. spO2 92% after leg press.     Pt educated throughout session about proper posture and technique with exercises. Improved exercise technique, movement at target joints, use of target muscles after min to mod verbal, visual, tactile cues.  Pt response/clinical impression: the patient had improved ability to participate with therapy this session, no complaints of pain with exercises. Patient was 10 minutes late to session, modified accordingly. Patient most challenged with narrow base of support activities, several LOB noted, able to correct with minA and BUE support. The patient would benefit from further skilled PT to continue to progress towards goals.    PT Education - 07/05/19 1018    Education provided  Yes    Education Details  exercise technique, balance strategies    Person(s) Educated  Patient    Methods  Explanation;Demonstration;Tactile cues;Verbal cues    Comprehension  Returned demonstration;Verbal cues required;Tactile cues required;Need further instruction;Verbalized understanding       PT Short Term Goals - 06/17/19 1228      PT SHORT TERM GOAL #1   Title  Patient will be independent in home exercise program to improve strength/mobility for better functional independence with ADLs.    Baseline  10/16: HEP given    Time  4    Period  Weeks    Status  New    Target Date  07/15/19      PT SHORT TERM GOAL #2   Title  Patient will deny any  falls over past 4 weeks to demonstrate improved safety awareness at home and work.    Baseline  10/16: one major fall    Time  4    Period  Weeks    Status  New    Target Date  07/15/19        PT Long Term Goals - 06/17/19 1229  PT LONG TERM GOAL #1   Title  Patient will increase BLE gross strength to 4+/5 as to improve functional strength for independent gait, increased standing tolerance and increased ADL ability.    Baseline  10/16: R gross 4-/5 with add/ext 3+/5 L grossly 3+/5 with hip add/ext 2+/5    Time  8    Period  Weeks    Status  New    Target Date  08/12/19      PT LONG TERM GOAL #2   Title  Patient (> 66 years old) will complete five times sit to stand test in < 15 seconds indicating an increased LE strength and improved balance.    Baseline  10/16: 17 seconds with heavy use of hands on knees    Time  8    Period  Weeks    Status  New    Target Date  08/12/19      PT LONG TERM GOAL #3   Title  Patient will increase Berg Balance score by > 6 points (43/56)  to demonstrate decreased fall risk during functional activities.    Baseline  10/16: 37/56    Time  8    Period  Weeks    Status  New    Target Date  08/12/19      PT LONG TERM GOAL #4   Title  Patient will increase 10 meter walk test to >1.63ms as to improve gait speed for better community ambulation and to reduce fall risk.    Baseline  10/16: 0.91 w/o AD    Time  8    Period  Weeks    Status  New    Target Date  08/12/19      PT LONG TERM GOAL #5   Title  Patient will increase ABC scale score >80% to demonstrate better functional mobility and better confidence with ADLs.    Baseline  10/16; 71%    Time  8    Period  Weeks    Status  New    Target Date  08/12/19            Plan - 07/05/19 1018    Clinical Impression Statement  the patient had improved ability to participate with therapy this session, no complaints of pain with exercises. Patient was 10 minutes late to session, modified  accordingly. Patient most challenged with narrow base of support activities, several LOB noted, able to correct with minA and BUE support. The patient would benefit from further skilled PT to continue to progress towards goals.    Personal Factors and Comorbidities  Age;Comorbidity 3+;Fitness;Past/Current Experience;Sex;Social Background;Time since onset of injury/illness/exacerbation;Transportation    Comorbidities  AAA, arthritis, atherosclerotic heart disease, (s/p CABG), benign neoplasm of ascending/descending colon, COPD, CAD, dementia, dysrhythmia, frequent falls, GERD, hemorrhoid, HOH, HTN, PAF, prostate cancer.    Examination-Activity Limitations  Bathing;Bend;Carry;Dressing;Continence;Stairs;Squat;Reach Overhead;Locomotion Level;Lift;Stand;Transfers;Toileting    Examination-Participation Restrictions  Church;Cleaning;Community Activity;Driving;Interpersonal Relationship;Laundry;Volunteer;Shop;Personal Finances;Meal Prep;Yard Work    RPublixPotential  Fair    Clinical Impairments Affecting Rehab Potential  (+) good support system, high prior level of function (-) currently undergoing radiation therapy for prostate cancer, memory deficits     PT Frequency  2x / week    PT Duration  8 weeks    PT Treatment/Interventions  ADLs/Self Care Home Management;Aquatic Therapy;Cryotherapy;Moist Heat;Traction;Ultrasound;Therapeutic activities;Functional mobility training;Stair training;Gait training;DME Instruction;Therapeutic exercise;Balance training;Neuromuscular re-education;Patient/family education;Manual techniques;Passive range of motion;Energy conservation;Vestibular;Taping    PT Next Visit Plan  progress dynamic stability    PT  Home Exercise Plan  see above    Consulted and Agree with Plan of Care  Patient       Patient will benefit from skilled therapeutic intervention in order to improve the following deficits and impairments:  Abnormal gait, Cardiopulmonary status limiting activity, Decreased  activity tolerance, Decreased balance, Decreased knowledge of precautions, Decreased endurance, Decreased coordination, Decreased cognition, Decreased knowledge of use of DME, Decreased mobility, Difficulty walking, Decreased safety awareness, Decreased strength, Impaired flexibility, Impaired perceived functional ability, Impaired sensation, Postural dysfunction, Improper body mechanics, Pain  Visit Diagnosis: Unsteadiness on feet  Muscle weakness (generalized)  Other abnormalities of gait and mobility  Difficulty in walking, not elsewhere classified     Problem List Patient Active Problem List   Diagnosis Date Noted  . Status post abdominal aortic aneurysm (AAA) repair 04/15/2019  . Candidiasis 02/22/2019  . Malignant neoplasm of descending colon ([place])   . Benign neoplasm of descending colon   . Benign neoplasm of transverse colon   . Do not intubate, cardiopulmonary resuscitation (CPR)-only code status 10/08/2018  . Prostate cancer ([place]) 02/25/2018  . Prostatic cyst 01/15/2018  . 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 ([place]) 09/15/2017  . Blood in stool   . Abnormal feces   . Stricture and stenosis of esophagus   . Mild cognitive impairment 08/18/2017  . Senile purpura ([place]) 04/29/2017  . Anemia 04/29/2017  . Frequent falls 04/29/2017  . Acute respiratory failure ([place]) 12/17/2016  . Simple chronic bronchitis ([place]) 12/17/2016  . Benign localized hyperplasia of prostate with urinary obstruction 07/15/2016  . Elevated PSA 07/15/2016  . Incomplete emptying of bladder 07/15/2016  . Urge incontinence 07/15/2016  . Hypothyroidism 04/25/2016  . Macular degeneration 04/25/2016  . Erectile dysfunction due to arterial insufficiency   . Ischemic heart disease with chronotropic incompetence   . CN (constipation) 02/10/2015  . DD (diverticular disease) 02/10/2015  . Fatigue 02/10/2015  . Lichen planus 90/30/0923  . Restless leg 02/10/2015  . 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 B12  deficiency 02/10/2015  . COPD (chronic obstructive pulmonary disease) ([place]) 11/14/2014  . Post Inflammatory Lung Changes 11/14/2014  . PAF (paroxysmal atrial fibrillation) ([facility]) CHA2DS2-VASc = 4. AC = Eliquis   . Insomnia 12/09/2013  . OSA on CPAP   . Dyspnea on exertion - -essentially resolved with BB dose reduction & wgt loss 03/29/2013  . Overweight (BMI 25.0-29.9) -- Wgt back up 01/17/2013  . Atherosclerotic heart disease of native coronary artery without angina pectoris - s/p CABG, occluded SVG-D1; patent LIMA-LAD, SVG-OM, SVG- RCA   . Essential hypertension   . Hyperlipidemia with target LDL less than 70   . Aorto-iliac disease ([place])   . BCC (basal cell carcinoma), eyelid 01/03/2013  . Allergic rhinitis 08/17/2009  . Adaptation reaction 05/28/2009  . Acid reflux 05/28/2009  . Arthritis, degenerative 05/28/2009  . 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 Carotid artery occlusion without infarction 06/01/2006  . PAD (peripheral artery disease) - bilateral common iliac stents 12/15/2005  . Aortoiliac occlusive disease ([place]) 11/30/2005  . Atherosclerotic heart disease of artery bypass graft 09/01/1997    Lieutenant Diego PT, DPT 10:22 AM,07/05/19 [phone]  [place] [facility] SERVICES [address] [place], [place], [address] Phone: [phone]   Fax:  [phone]  Name: [name] MRN: [id number] Date of Birth: [birth year]

## 2019-07-07 ENCOUNTER — Other Ambulatory Visit: Payer: Self-pay

## 2019-07-07 ENCOUNTER — Encounter (INDEPENDENT_AMBULATORY_CARE_PROVIDER_SITE_OTHER): Payer: Self-pay | Admitting: Nurse Practitioner

## 2019-07-07 ENCOUNTER — Ambulatory Visit (INDEPENDENT_AMBULATORY_CARE_PROVIDER_SITE_OTHER): Payer: Medicare Other

## 2019-07-07 ENCOUNTER — Ambulatory Visit (INDEPENDENT_AMBULATORY_CARE_PROVIDER_SITE_OTHER): Payer: Medicare Other | Admitting: Nurse Practitioner

## 2019-07-07 VITALS — BP 151/78 | HR 75 | Resp 18 | Ht 70.0 in | Wt 189.0 lb

## 2019-07-07 DIAGNOSIS — I714 Abdominal aortic aneurysm, without rupture, unspecified: Secondary | ICD-10-CM

## 2019-07-07 DIAGNOSIS — J432 Centrilobular emphysema: Secondary | ICD-10-CM

## 2019-07-07 DIAGNOSIS — I10 Essential (primary) hypertension: Secondary | ICD-10-CM

## 2019-07-07 DIAGNOSIS — Z9889 Other specified postprocedural states: Secondary | ICD-10-CM

## 2019-07-07 DIAGNOSIS — Z8679 Personal history of other diseases of the circulatory system: Secondary | ICD-10-CM

## 2019-07-07 NOTE — Progress Notes (Signed)
SUBJECTIVE:  Patient ID: [name], male    DOB: 12-21-1933, 85 y.o.   MRN: [id number] Chief Complaint  Patient presents with  . Follow-up    HPI  [name] is a 85 y.o. male The patient returns to the office for surveillance of an abdominal aortic aneurysm status post stent graft placement on 05/18/2019  Patient denies abdominal pain or back pain, no other abdominal complaints. No groin related complaints. No symptoms consistent with distal embolization No changes in claudication distance.   There have been no interval changes in his overall healthcare since his last visit.   Patient denies amaurosis fugax or TIA symptoms. There is no history of claudication or rest pain symptoms of the lower extremities. The patient denies angina or shortness of breath.   Duplex US of the aorta and iliac arteries shows a 3.6 AAA sac with no endoleak, decrease in the sac compared to the previous study.The patient returns to the office for surveillance of an abdominal aortic aneurysm status post stent graft placement on 05/18/2019.    Past Medical History:  Diagnosis Date  . AAA (abdominal aortic aneurysm) ([place])   . Arthritis   . Atherosclerotic heart disease of native coronary artery without angina pectoris 1998   - s/p CABG, occluded SVG-D1; patent LIMA-LAD, SVG-OM, SVG- RCA  . Basal cell carcinoma of eyelid 01/03/2013   2019 - R side of Nose  . Benign neoplasm of ascending colon   . Benign neoplasm of descending colon   . Cancer [facility])    Skin cancer  . Colon polyps   . COPD (chronic obstructive pulmonary disease) ([place])   . Coronary artery disease   . Dementia ([place])   . Dysrhythmia    atrial fib  . Falls frequently   . GERD (gastroesophageal reflux disease)   . Hemorrhoid 02/10/2015  . History of hiatal hernia   . History of kidney stones   . HOH (hard of hearing)   . Hypertension   . Memory loss   . Osteoporosis   . PAF (paroxysmal atrial fibrillation) ([place])   .  Prostate cancer ([place])   . Prostate disorder   . Sciatic pain    Chronic  . Sleep apnea    cpap  . Temporary cerebral vascular dysfunction 02/10/2015   Had negative work-up.  July, 2012.  No medication changes, had forgot them that day, got dehydrated.   . Urinary incontinence     Past Surgical History:  Procedure Laterality Date  . ABDOMINAL AORTIC ENDOVASCULAR STENT GRAFT Bilateral 05/18/2019   Procedure: ABDOMINAL AORTIC ENDOVASCULAR STENT GRAFT;  Surgeon: Algernon Huxley, MD;  Location: [facility] ORS;  Service: Vascular;  Laterality: Bilateral;  . ANKLE SURGERY    . Arch aortogram and carotid aortogram  06/02/2006   Dr Oneida Alar did surgery  . BASAL CELL CARCINOMA EXCISION  04/2016   Dermatology  . CARDIAC CATHETERIZATION  01/30/1998    (Dr. Linard Millers): Native LAD & mRCA CTO. 90% D1. Mod Cx. SVG-D1 CTO. SVG-RCA & SVG-OM along with LIMA-LAD patent;  LV NORMAL Fxn  . CAROTID ENDARTERECTOMY Left Oct. 29, 2007   Dr. Oneida Alar:  . CATARACT EXTRACTION W/PHACO Right 03/05/2017   Procedure: CATARACT EXTRACTION PHACO AND INTRAOCULAR LENS PLACEMENT (IOC);  Surgeon: Leandrew Koyanagi, MD;  Location: [facility] ORS;  Service: Ophthalmology;  Laterality: Right;  [place] 00:58.5 AP% 10.6 CDE 6.20 Fluid Pack lot # [id number]  . CATARACT EXTRACTION W/PHACO Left 04/15/2017   Procedure: CATARACT EXTRACTION PHACO AND  INTRAOCULAR LENS PLACEMENT ([place])  Left;  Surgeon: Leandrew Koyanagi, MD;  Location: [place];  Service: Ophthalmology;  Laterality: Left;  . COLONOSCOPY WITH PROPOFOL N/A 09/15/2017   Procedure: COLONOSCOPY WITH PROPOFOL;  Surgeon: Lucilla Lame, MD;  Location[facility] END[facility]COPY;  Service: Endoscopy;  Laterality: N/A;  . COLONOSCOPY WITH PROPOFOL N/A 10/26/2018   Procedure: COLONOSCOPY WITH PROPOFOL;  Surgeon: Lucilla Lame, MD;  Location: [facility] ENDOSCOPY;  Service: Endoscopy;  Laterality: N/A;  . CORONARY ARTERY BYPASS GRAFT  1998   LIMA-LAD, SVG-OM, SVG-RPDA, SVG-DIAG  . CPET / MET  12/2012   Mild  chronotropic incompetence - read 82% of predicted; also reduced effort; peak VO2 15.7 / 75% (did not reach Max effort) -- suggested ischemic response in last 1.5 minutes of exercise. Normal pulmonary function on PFTs but poor response to  . DOPPLER ECHOCARDIOGRAPHY  11/20/2010   EF =>55%; LV norm mild aortic scelorosis  . ENDOVASCULAR REPAIR/STENT GRAFT N/A 05/18/2019   Procedure: ENDOVASCULAR REPAIR/STENT GRAFT;  Surgeon: Algernon Huxley, MD;  Location: [place] [facility];  Service: Cardiovascular;  Laterality: N/A;  . ESOPHAGOGASTRODUODENOSCOPY (EGD) WITH PROPOFOL N/A 09/15/2017   Procedure: ESOPHAGOGASTRODUODENOSCOPY (EGD) WITH PROPOFOL;  Surgeon: Lucilla Lame, MD;  Location: [facility] ENDOSCOPY;  Service: Endoscopy;  Laterality: N/A;  . FRACTURE SURGERY Right 03/19/2015   wrist  . HIATAL HERNIA REPAIR    . ILIAC ARTERY STENT  12/15/2005   PTA and direct stenting rgt and lft common iliac arteries  . KYPHOPLASTY N/A 04/05/2019   Procedure: KYPHOPLASTY T12, L3, L4 L5;  Surgeon: Hessie Knows, MD;  Location: [facility] ORS;  Service: Orthopedics;  Laterality: N/A;  . NM GATED MYOVIEW ([place] [facility])  12/2017   Low Risk - no ischemia or infarct.  EF > 65%. no RWMA  . NM MYOVIEW ([place] [facility])  11/11/2010; February 2016   a) TM STRESS----NORMAL PERFUSION ,EF 68%; b) EF 50-55%. Normal LV function. No significant ischemia or infarction.  . OPEN REDUCTION INTERNAL FIXATION (ORIF) DISTAL RADIAL FRACTURE Left 01/13/2019   Procedure: OPEN REDUCTION INTERNAL FIXATION (ORIF) DISTAL  RADIAL FRACTURE - LEFT - SLEEP APNEA;  Surgeon: Hessie Knows, MD;  Location: [facility] ORS;  Service: Orthopedics;  Laterality: Left;  . ORIF ELBOW FRACTURE Right 01/01/2017   Procedure: OPEN REDUCTION INTERNAL FIXATION (ORIF) ELBOW/OLECRANON FRACTURE;  Surgeon: Hessie Knows, MD;  Location: [facility] ORS;  Service: Orthopedics;  Laterality: Right;  . ORIF WRIST FRACTURE Right 03/19/2015   Procedure: OPEN REDUCTION INTERNAL FIXATION (ORIF) WRIST FRACTURE;  Surgeon:  Hessie Knows, MD;  Location: [facility] ORS;  Service: Orthopedics;  Laterality: Right;  . THORACIC AORTA - CAROTID ANGIOGRAM  October 2007   Dr. 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 TRANSTHORACIC ECHOCARDIOGRAM  February 2016   [facility]: Normal LV function. Dilated left atrium.    Social History   Socioeconomic History  . Marital status: Widowed    Spouse name: Not on file  . Number of children: Not on file  . Years of education: Not on file  . Highest education level: Not on file  Occupational History  . Not on file  Social Needs  . Financial resource strain: Not on file  . Food insecurity    Worry: Not on file    Inability: Not on file  . Transportation needs    Medical: Not on file    Non-medical: Not on file  Tobacco Use  . Smoking status: Former Smoker  Types: Cigarettes    Quit date: 08/04/1997    Years since quitting: 21.9  . Smokeless tobacco: Never Used  Substance and Sexual Activity  . Alcohol use: No  . Drug use: No  . Sexual activity: Not Currently    Birth control/protection: None  Lifestyle  . Physical activity    Days per week: Not on file    Minutes per session: Not on file  . Stress: Not on file  Relationships  . Social Herbalist on phone: Not on file    Gets together: Not on file    Attends religious service: Not on file    Active member of club or organization: Not on file    Attends meetings of clubs or organizations: Not on file    Relationship status: Not on file  . Intimate partner violence    Fear of current or ex partner: Not on file    Emotionally abused: Not on file    Physically abused: Not on file    Forced sexual activity: Not on file  Other Topics Concern  . Not on file  Social History Narrative   Resides at [facility] since 06/2018   He is a widowed father of one, grandfather of 34 with 3 stepchildren.    Now accompanied by his  long-term significant other.      Currently undergoing physical therapy, Occupational Therapy treatments.      He has changed his CODE STATUS to DNR.    Family History  Problem Relation Age of Onset  . Ulcers Mother        Peptic  . Dementia Mother   . Alcohol abuse Father     Allergies  Allergen Reactions  . Clonazepam Other (See Comments)    Altered mental status  . Codeine Other (See Comments)    Altered mental status.  . Niacin And Related Other (See Comments)    Flushing of skin  . Tramadol     Other reaction(s): Hallucination  . Hydrocodone     confusion  . Oxycodone Other (See Comments)    confusion confusion     Review of Systems   Review of Systems: Negative Unless Checked Constitutional: _0 Weight loss  _1 Fever  _2 Chills Cardiac: _3 Chest pain   _4  Atrial Fibrillation  _5 Palpitations   _6 Shortness of breath when laying flat   _7 Shortness of breath with exertion. _8 Shortness of breath at rest Vascular:  _9 Pain in legs with walking   _10 Pain in legs with standing _11 Pain in legs when laying flat   _12 Claudication    _13 Pain in feet when laying flat    _14 History of DVT   _15 Phlebitis   _16 Swelling in legs   _17 Varicose veins   _18 Non-healing ulcers Pulmonary:   _19 Uses home oxygen   _20 Productive cough   _21 Hemoptysis   _22 Wheeze  _23 COPD   _24 Asthma Neurologic:  _25 Dizziness   _26 Seizures  _27 Blackouts _28 History of stroke   _29 History of TIA  _30 Aphasia   _31 Temporary Blindness   _32 Weakness or numbness in arm   _33 Weakness or numbness in leg Musculoskeletal:   _34 Joint swelling   _35 Joint pain   _36 Low back pain  _37  History of Knee Replacement _38 Arthritis _39 back Surgeries  _40  Spinal Stenosis    Hematologic:  _41 Easy bruising  _42 Easy bleeding   _43 Hypercoagulable state   _44 Anemic Gastrointestinal:  _45 Diarrhea   _46 Vomiting  _47 Gastroesophageal reflux/heartburn   _48 Difficulty swallowing. _49 Abdominal pain Genitourinary:  _50 Chronic kidney disease   _51 Difficult urination  _52 Anuric   _53 Blood in  urine _0 Frequent urination  _1 Burning with urination   _2 Hematuria Skin:  _3 Rashes   _4 Ulcers _5 Wounds Psychological:  _6 History of anxiety   _7  History of major depression  _8  Memory Difficulties      OBJECTIVE:   Physical Exam  BP (!) 151/78 (BP Location: Right Arm)   Pulse 75   Resp 18   Ht _9  (1.778 m)   Wt 189 lb (85.7 kg)   BMI 27.12 kg/m   Gen: WD/WN, NAD Head: [place]/AT, No temporalis wasting.  Ear/Nose/Throat: Hearing grossly intact, nares w/o erythema or drainage Eyes: PER, EOMI, sclera nonicteric.  Neck: Supple, no masses.  No JVD.  Pulmonary:  Good air movement, no use of accessory muscles.  Cardiac: RRR Vascular:  Vessel Right Left  Radial Palpable Palpable  Dorsalis Pedis Palpable Palpable  Posterior Tibial Palpable Palpable   Gastrointestinal: soft, non-distended. No guarding/no peritoneal signs.  Musculoskeletal: M/S 5/5 throughout.  No deformity or atrophy.  Neurologic: Pain and light touch intact in extremities.  Symmetrical.  Speech is fluent. Motor exam as listed above. Psychiatric: Judgment intact, Mood & affect appropriate for pt's clinical situation. Dermatologic: No Venous rashes. No Ulcers Noted.  No changes consistent with cellulitis. Lymph : No Cervical lymphadenopathy, no lichenification or skin changes of chronic lymphedema.       ASSESSMENT AND PLAN:  1. Status post abdominal aortic aneurysm (AAA) repair Recommend: Patient is status post successful endovascular repair of the AAA.   No further intervention is required at this time.   No endoleak is detected and the aneurysm sac is stable.  The patient will continue antiplatelet therapy as prescribed as well as aggressive management of hyperlipidemia. Exercise is again strongly encouraged.   However, endografts require continued surveillance with ultrasound or CT scan. This is mandatory to detect any changes that allow repressurization of the aneurysm sac.  The patient is informed that  this would be asymptomatic.  The patient is reminded that lifelong routine surveillance is a necessity with an endograft. Patient will continue to follow-up at 6 month intervals with ultrasound of the aorta. - VAS [place] EVAR DUPLEX; Future  2. Centrilobular emphysema (HCC) Continue pulmonary medications and aerosols as already ordered, these medications have been reviewed and there are no changes at this time.    3. Essential hypertension Continue antihypertensive medications as already ordered, these medications have been reviewed and there are no changes at this time.   Current Outpatient Medications on File Prior to Visit  Medication Sig Dispense Refill  . acetaminophen (TYLENOL) 500 MG tablet Take 500-1,000 mg by mouth every 6 (six) hours as needed for mild pain or moderate pain.     [name] ADVAIR DISKUS 250-50 MCG/DOSE AEPB INHALE ONE PUFF BY MOUTH TWICE DAILY AS DIRECTED (RINSE MOUTH AFTER USE) (Patient taking differently: Inhale 1 puff into the lungs 2 (two) times a day. ) 60 each 5  . alendronate (FOSAMAX) 70 MG tablet Take 1 tablet (70 mg total) by mouth once a week. Take with a full glass of water on an empty stomach. 4 tablet 12  . Ascorbic Acid (VITAMIN C) 1000 MG tablet Take 1,000 mg by mouth daily.    . Cyanocobalamin (VITAMIN B-12) 500 MCG SUBL Place 500 mcg under the tongue daily.     [name] donepezil (ARICEPT) 10 MG tablet Take 10 mg by mouth at bedtime.     [name] ELIQUIS 5 MG TABS tablet TAKE 1 TABLET BY MOUTH TWICE DAILY (Patient taking differently: Take 5 mg by  mouth 2 (two) times daily. ) 180 tablet 1  . ezetimibe (ZETIA) 10 MG tablet Take 1 tablet (10 mg total) by mouth daily. (Patient taking differently: Take 10 mg by mouth at bedtime. ) 90 tablet 4  . finasteride (PROSCAR) 5 MG tablet Take 1 tablet (5 mg total) by mouth daily. 90 tablet 3  . fluticasone (FLONASE) 50 MCG/ACT nasal spray Place 2 sprays into both nostrils daily as needed for allergies. 16 g 4  . lisinopril  (PRINIVIL,ZESTRIL) 2.5 MG tablet TAKE ONE TABLET BY MOUTH TWICE DAILY (Patient taking differently: Take 2.5 mg by mouth 2 (two) times a day. ) 180 tablet 4  . Magnesium Oxide 500 MG TABS Take 500 mg by mouth every evening.    . metoprolol tartrate (LOPRESSOR) 25 MG tablet Take 1 tablet (25 mg total) by mouth 2 (two) times daily. *OFFICE VISIT NEEDED FOR FURTHER REFILLS* 180 tablet 0  . mirabegron ER (MYRBETRIQ) 50 MG TB24 tablet Take 1 tablet (50 mg total) by mouth daily. (Patient taking differently: Take 50 mg by mouth every evening. ) 30 tablet 11  . Multiple Vitamins-Minerals (PRESERVISION AREDS 2 PO) Take 1 tablet by mouth 2 (two) times daily.     . nitroGLYCERIN (NITROSTAT) 0.4 MG SL tablet Place 1 tablet (0.4 mg total) under the tongue every 5 (five) minutes as needed for chest pain. 20 tablet 1  . NON FORMULARY Swish and spit 5 mLs at bedtime. Doxycycline/dexamethasone/nystatin/diphenhydramine compounded mouth elixir    . pantoprazole (PROTONIX) 40 MG tablet TAKE ONE TABLET BY MOUTH EVERY DAY 30 tablet 5  . rosuvastatin (CRESTOR) 20 MG tablet TAKE ONE TABLET EVERY DAY (Patient taking differently: Take 20 mg by mouth every evening. ) 90 tablet 4  . tamsulosin (FLOMAX) 0.4 MG CAPS capsule Take 1 capsule (0.4 mg total) by mouth every evening. 90 capsule 3   No current facility-administered medications on file prior to visit.     There are no Patient Instructions on file for this visit. No follow-ups on file.   Kris Hartmann, NP  This note was completed with Sales executive.  Any errors are purely unintentional.

## 2019-07-08 ENCOUNTER — Ambulatory Visit: Payer: Medicare Other

## 2019-07-08 DIAGNOSIS — M6281 Muscle weakness (generalized): Secondary | ICD-10-CM

## 2019-07-08 DIAGNOSIS — R2689 Other abnormalities of gait and mobility: Secondary | ICD-10-CM | POA: Diagnosis not present

## 2019-07-08 DIAGNOSIS — R2681 Unsteadiness on feet: Secondary | ICD-10-CM | POA: Diagnosis not present

## 2019-07-08 DIAGNOSIS — R262 Difficulty in walking, not elsewhere classified: Secondary | ICD-10-CM | POA: Diagnosis not present

## 2019-07-08 NOTE — Therapy (Signed)
[place] MAIN [facility] SERVICES [address] [place], [place], [address] Phone: [phone]   Fax:  [phone]  Physical Therapy Treatment  Patient Details  Name: [name] MRN: [id number] Date of Birth: [birth year] Referring Provider (PT): Eulogio Ditch   Encounter Date: 07/08/2019  PT End of Session - 07/08/19 0930    Visit Number  7    Number of Visits  16    Date for PT Re-Evaluation  08/12/19    Authorization Type  7/10 eval 10/16    PT Start Time  0925    PT Stop Time  1010    PT Time Calculation (min)  45 min    Equipment Utilized During Treatment  Gait belt    Activity Tolerance  Patient tolerated treatment well    Behavior During Therapy  [facility] for tasks assessed/performed       Past Medical History:  Diagnosis Date  . AAA (abdominal aortic aneurysm) ([place])   . Arthritis   . Atherosclerotic heart disease of native coronary artery without angina pectoris 1998   - s/p CABG, occluded SVG-D1; patent LIMA-LAD, SVG-OM, SVG- RCA  . Basal cell carcinoma of eyelid 01/03/2013   2019 - R side of Nose  . Benign neoplasm of ascending colon   . Benign neoplasm of descending colon   . Cancer [facility])    Skin cancer  . Colon polyps   . COPD (chronic obstructive pulmonary disease) ([place])   . Coronary artery disease   . Dementia ([place])   . Dysrhythmia    atrial fib  . Falls frequently   . GERD (gastroesophageal reflux disease)   . Hemorrhoid 02/10/2015  . History of hiatal hernia   . History of kidney stones   . HOH (hard of hearing)   . Hypertension   . Memory loss   . Osteoporosis   . PAF (paroxysmal atrial fibrillation) ([place])   . Prostate cancer ([place])   . Prostate disorder   . Sciatic pain    Chronic  . Sleep apnea    cpap  . Temporary cerebral vascular dysfunction 02/10/2015   Had negative work-up.  July, 2012.  No medication changes, had forgot them that day, got dehydrated.   . Urinary incontinence     Past Surgical  History:  Procedure Laterality Date  . ABDOMINAL AORTIC ENDOVASCULAR STENT GRAFT Bilateral 05/18/2019   Procedure: ABDOMINAL AORTIC ENDOVASCULAR STENT GRAFT;  Surgeon: Algernon Huxley, MD;  Location: [facility] ORS;  Service: Vascular;  Laterality: Bilateral;  . ANKLE SURGERY    . Arch aortogram and carotid aortogram  06/02/2006   Dr Oneida Alar did surgery  . BASAL CELL CARCINOMA EXCISION  04/2016   Dermatology  . CARDIAC CATHETERIZATION  01/30/1998    (Dr. Linard Millers): Native LAD & mRCA CTO. 90% D1. Mod Cx. SVG-D1 CTO. SVG-RCA & SVG-OM along with LIMA-LAD patent;  LV NORMAL Fxn  . CAROTID ENDARTERECTOMY Left Oct. 29, 2007   Dr. Oneida Alar:  . CATARACT EXTRACTION W/PHACO Right 03/05/2017   Procedure: CATARACT EXTRACTION PHACO AND INTRAOCULAR LENS PLACEMENT (IOC);  Surgeon: Leandrew Koyanagi, MD;  Location: [facility] ORS;  Service: Ophthalmology;  Laterality: Right;  [place] 00:58.5 AP% 10.6 CDE 6.20 Fluid Pack lot # [id number]  . CATARACT EXTRACTION W/PHACO Left 04/15/2017   Procedure: CATARACT EXTRACTION PHACO AND INTRAOCULAR LENS PLACEMENT ([place])  Left;  Surgeon: Leandrew Koyanagi, MD;  Location: [place];  Service: Ophthalmology;  Laterality: Left;  . COLONOSCOPY WITH PROPOFOL N/A 09/15/2017  Procedure: COLONOSCOPY WITH PROPOFOL;  Surgeon: Lucilla Lame, MD;  Location: [facility] ENDOSCOPY;  Service: Endoscopy;  Laterality: N/A;  . COLONOSCOPY WITH PROPOFOL N/A 10/26/2018   Procedure: COLONOSCOPY WITH PROPOFOL;  Surgeon: Lucilla Lame, MD;  Location: [facility] ENDOSCOPY;  Service: Endoscopy;  Laterality: N/A;  . CORONARY ARTERY BYPASS GRAFT  1998   LIMA-LAD, SVG-OM, SVG-RPDA, SVG-DIAG  . CPET / MET  12/2012   Mild chronotropic incompetence - read 82% of predicted; also reduced effort; peak VO2 15.7 / 75% (did not reach Max effort) -- suggested ischemic response in last 1.5 minutes of exercise. Normal pulmonary function on PFTs but poor response to  . DOPPLER ECHOCARDIOGRAPHY  11/20/2010   EF =>55%; LV norm mild aortic  scelorosis  . ENDOVASCULAR REPAIR/STENT GRAFT N/A 05/18/2019   Procedure: ENDOVASCULAR REPAIR/STENT GRAFT;  Surgeon: Algernon Huxley, MD;  Location: [place] CV LAB;  Service: Cardiovascular;  Laterality: N/A;  . ESOPHAGOGASTRODUODENOSCOPY (EGD) WITH PROPOFOL N/A 09/15/2017   Procedure: ESOPHAGOGASTRODUODENOSCOPY (EGD) WITH PROPOFOL;  Surgeon: Lucilla Lame, MD;  Location: [facility] ENDOSCOPY;  Service: Endoscopy;  Laterality: N/A;  . FRACTURE SURGERY Right 03/19/2015   wrist  . HIATAL HERNIA REPAIR    . ILIAC ARTERY STENT  12/15/2005   PTA and direct stenting rgt and lft common iliac arteries  . KYPHOPLASTY N/A 04/05/2019   Procedure: KYPHOPLASTY T12, L3, L4 L5;  Surgeon: Hessie Knows, MD;  Location: [facility] ORS;  Service: Orthopedics;  Laterality: N/A;  . NM GATED MYOVIEW ([place] HX)  12/2017   Low Risk - no ischemia or infarct.  EF > 65%. no RWMA  . NM MYOVIEW ([place] HX)  11/11/2010; February 2016   a) TM STRESS----NORMAL PERFUSION ,EF 68%; b) EF 50-55%. Normal LV function. No significant ischemia or infarction.  . OPEN REDUCTION INTERNAL FIXATION (ORIF) DISTAL RADIAL FRACTURE Left 01/13/2019   Procedure: OPEN REDUCTION INTERNAL FIXATION (ORIF) DISTAL  RADIAL FRACTURE - LEFT - SLEEP APNEA;  Surgeon: Hessie Knows, MD;  Location: [facility] ORS;  Service: Orthopedics;  Laterality: Left;  . ORIF ELBOW FRACTURE Right 01/01/2017   Procedure: OPEN REDUCTION INTERNAL FIXATION (ORIF) ELBOW/OLECRANON FRACTURE;  Surgeon: Hessie Knows, MD;  Location: [facility] ORS;  Service: Orthopedics;  Laterality: Right;  . ORIF WRIST FRACTURE Right 03/19/2015   Procedure: OPEN REDUCTION INTERNAL FIXATION (ORIF) WRIST FRACTURE;  Surgeon: Hessie Knows, MD;  Location: [facility] ORS;  Service: Orthopedics;  Laterality: Right;  . THORACIC AORTA - CAROTID ANGIOGRAM  October 2007   Dr. 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 TRANSTHORACIC ECHOCARDIOGRAM  February  2016   [facility]: Normal LV function. Dilated left atrium.    There were no vitals filed for this visit.  Subjective Assessment - 07/08/19 0929    Subjective  Patient reports no falls or LOB since last session. Has been exercising and walking when the weather permits.    Pertinent History  Patient referred to physical therapy for balance/strength issues s/p abdominal aortic aneurysm (AAA) without rupture repair on 05/18/19 .  Patient has been seen by this therapist in the past (last seen in march of 2020) Patient had a fall on 01/08/19, MRI [facility] dated 03/28/19 report acute compression fractures at T12, L3 and L4 as well as facet arthropathy at the lower lumbar levels and at L3-4 moderate central stenosis. Patient underwent injection on 10/6 at L4-5 and L5-S1. PMH includes AAA, arthritis, atherosclerotic heart disease, (s/p CABG), benign neoplasm of ascending/descending colon, COPD,  CAD, dementia, dysrhythmia, frequent falls, GERD, hemorrhoid, HOH, HTN, PAF, prostate cancer. Monday through Friday have 3 people come in different times from 730 to 12pm. Uses a walker occasionally now    Limitations  Walking;Standing;House hold activities;Other (comment);Lifting    How long can you sit comfortably?  20 minutes before back is painful    How long can you stand comfortably?  hour    How long can you walk comfortably?  using a walker about 30 minutes    Patient Stated Goals  patient would like to improve his balance and strenght.    Currently in Pain?  No/denies           TREATMENT:   Nustep Lvl 3 RPM> 60 for cardiovascular challenge 4 minutes    Neuromuscular Re-education: verbal cueing for task orientation and body mechanics for stability.     in //:  airex balance  Beam: side stepping in // bars x6 trials with decreasing UE support    airex pad one foot on airex pad one foot on 6" step static hold 30 seconds x 2 trials each LE    Balloon taps reaching inside and outside BOS with reaction  timing x 3 minutes  Red light green light in hallway for initiation/termination of ambulation 2x 86 ft  Therex:    Standing with # 4 ankle weight: CGA for stability  -Hip extension with bilateral upper extremity support, cueing for neutral hip alignment, upright posture for optimal muscle recruitment, and sequencing, 10x each LE,  -Hip abduction with bilateral upper extremity support, cueing for neutral foot alignment for correct muscle activation, 10x each LE -Hip flexion with bilateral upper extremity support, cueing for body mechanics, speed of muscle recruitment for optimal strengthening and stabilization 10x each LE  Standing heel toe raises 10x   Vitals monitored throughout session during rest periods to ensure O2 saturation return to functional range prior to initiation of each exercise. spO2 92% after leg press.     Pt educated throughout session about proper posture and technique with exercises. Improved exercise technique, movement at target joints, use of target muscles after min to mod verbal, visual, tactile cues.                      PT Education - 07/08/19 0930    Education provided  Yes    Education Details  exercise technique, body mechanics, stability    Person(s) Educated  Patient    Methods  Explanation;Demonstration;Tactile cues;Verbal cues    Comprehension  Verbalized understanding;Returned demonstration;Verbal cues required;Tactile cues required       PT Short Term Goals - 06/17/19 1228      PT SHORT TERM GOAL #1   Title  Patient will be independent in home exercise program to improve strength/mobility for better functional independence with ADLs.    Baseline  10/16: HEP given    Time  4    Period  Weeks    Status  New    Target Date  07/15/19      PT SHORT TERM GOAL #2   Title  Patient will deny any falls over past 4 weeks to demonstrate improved safety awareness at home and work.    Baseline  10/16: one major fall    Time  4     Period  Weeks    Status  New    Target Date  07/15/19        PT Long Term Goals - 06/17/19 1229  PT LONG TERM GOAL #1   Title  Patient will increase BLE gross strength to 4+/5 as to improve functional strength for independent gait, increased standing tolerance and increased ADL ability.    Baseline  10/16: R gross 4-/5 with add/ext 3+/5 L grossly 3+/5 with hip add/ext 2+/5    Time  8    Period  Weeks    Status  New    Target Date  08/12/19      PT LONG TERM GOAL #2   Title  Patient (> 61 years old) will complete five times sit to stand test in < 15 seconds indicating an increased LE strength and improved balance.    Baseline  10/16: 17 seconds with heavy use of hands on knees    Time  8    Period  Weeks    Status  New    Target Date  08/12/19      PT LONG TERM GOAL #3   Title  Patient will increase Berg Balance score by > 6 points (43/56)  to demonstrate decreased fall risk during functional activities.    Baseline  10/16: 37/56    Time  8    Period  Weeks    Status  New    Target Date  08/12/19      PT LONG TERM GOAL #4   Title  Patient will increase 10 meter walk test to >1.8ms as to improve gait speed for better community ambulation and to reduce fall risk.    Baseline  10/16: 0.91 w/o AD    Time  8    Period  Weeks    Status  New    Target Date  08/12/19      PT LONG TERM GOAL #5   Title  Patient will increase ABC scale score >80% to demonstrate better functional mobility and better confidence with ADLs.    Baseline  10/16; 71%    Time  8    Period  Weeks    Status  New    Target Date  08/12/19            Plan - 07/08/19 1016    Clinical Impression Statement  Patient presents with excellent motivation to physical therapy session. Continues to improve with stability and ability to retain COM with sudden termination of movement. Patient was late to session but was able to be kept late due to open spot after patient's appointment. The patient would  benefit from continued skilled PT intervention to maximize mobility, safety, and functional abilities.    Personal Factors and Comorbidities  Age;Comorbidity 3+;Fitness;Past/Current Experience;Sex;Social Background;Time since onset of injury/illness/exacerbation;Transportation    Comorbidities  AAA, arthritis, atherosclerotic heart disease, (s/p CABG), benign neoplasm of ascending/descending colon, COPD, CAD, dementia, dysrhythmia, frequent falls, GERD, hemorrhoid, HOH, HTN, PAF, prostate cancer.    Examination-Activity Limitations  Bathing;Bend;Carry;Dressing;Continence;Stairs;Squat;Reach Overhead;Locomotion Level;Lift;Stand;Transfers;Toileting    Examination-Participation Restrictions  Church;Cleaning;Community Activity;Driving;Interpersonal Relationship;Laundry;Volunteer;Shop;Personal Finances;Meal Prep;Yard Work    RPublixPotential  Fair    Clinical Impairments Affecting Rehab Potential  (+) good support system, high prior level of function (-) currently undergoing radiation therapy for prostate cancer, memory deficits     PT Frequency  2x / week    PT Duration  8 weeks    PT Treatment/Interventions  ADLs/Self Care Home Management;Aquatic Therapy;Cryotherapy;Moist Heat;Traction;Ultrasound;Therapeutic activities;Functional mobility training;Stair training;Gait training;DME Instruction;Therapeutic exercise;Balance training;Neuromuscular re-education;Patient/family education;Manual techniques;Passive range of motion;Energy conservation;Vestibular;Taping    PT Next Visit Plan  progress dynamic stability    PT Home  Exercise Plan  see above    Consulted and Agree with Plan of Care  Patient       Patient will benefit from skilled therapeutic intervention in order to improve the following deficits and impairments:  Abnormal gait, Cardiopulmonary status limiting activity, Decreased activity tolerance, Decreased balance, Decreased knowledge of precautions, Decreased endurance, Decreased coordination,  Decreased cognition, Decreased knowledge of use of DME, Decreased mobility, Difficulty walking, Decreased safety awareness, Decreased strength, Impaired flexibility, Impaired perceived functional ability, Impaired sensation, Postural dysfunction, Improper body mechanics, Pain  Visit Diagnosis: Unsteadiness on feet  Muscle weakness (generalized)  Other abnormalities of gait and mobility     Problem List Patient Active Problem List   Diagnosis Date Noted  . Status post abdominal aortic aneurysm (AAA) repair 04/15/2019  . Candidiasis 02/22/2019  . Malignant neoplasm of descending colon ([place])   . Benign neoplasm of descending colon   . Benign neoplasm of transverse colon   . Do not intubate, cardiopulmonary resuscitation (CPR)-only code status 10/08/2018  . Prostate cancer ([place]) 02/25/2018  . Prostatic cyst 01/15/2018  . 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 ([place]) 09/15/2017  . Blood in stool   . Abnormal feces   . Stricture and stenosis of esophagus   . Mild cognitive impairment 08/18/2017  . Senile purpura ([place]) 04/29/2017  . Anemia 04/29/2017  . Frequent falls 04/29/2017  . Acute respiratory failure ([place]) 12/17/2016  . Simple chronic bronchitis ([place]) 12/17/2016  . Benign localized hyperplasia of prostate with urinary obstruction 07/15/2016  . Elevated PSA 07/15/2016  . Incomplete emptying of bladder 07/15/2016  . Urge incontinence 07/15/2016  . Hypothyroidism 04/25/2016  . Macular degeneration 04/25/2016  . Erectile dysfunction due to arterial insufficiency   . Ischemic heart disease with chronotropic incompetence   . CN (constipation) 02/10/2015  . DD (diverticular disease) 02/10/2015  . Fatigue 02/10/2015  . Lichen planus 12/12/6436  . Restless leg 02/10/2015  . 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 B12 deficiency 02/10/2015  . COPD (chronic obstructive pulmonary disease) ([place]) 11/14/2014  . Post Inflammatory Lung Changes 11/14/2014  . PAF (paroxysmal atrial  fibrillation) ([facility]) CHA2DS2-VASc = 4. AC = Eliquis   . Insomnia 12/09/2013  . OSA on CPAP   . Dyspnea on exertion - -essentially resolved with BB dose reduction & wgt loss 03/29/2013  . Overweight (BMI 25.0-29.9) -- Wgt back up 01/17/2013  . Atherosclerotic heart disease of native coronary artery without angina pectoris - s/p CABG, occluded SVG-D1; patent LIMA-LAD, SVG-OM, SVG- RCA   . Essential hypertension   . Hyperlipidemia with target LDL less than 70   . Aorto-iliac disease ([place])   . BCC (basal cell carcinoma), eyelid 01/03/2013  . Allergic rhinitis 08/17/2009  . Adaptation reaction 05/28/2009  . Acid reflux 05/28/2009  . Arthritis, degenerative 05/28/2009  . 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 Carotid artery occlusion without infarction 06/01/2006  . PAD (peripheral artery disease) - bilateral common iliac stents 12/15/2005  . Aortoiliac occlusive disease ([place]) 11/30/2005  . Atherosclerotic heart disease of artery bypass graft 09/01/1997   Janna Arch, PT, DPT   07/08/2019, 10:17 AM  [place] MAIN [facility] SERVICES [address] [place], [place], [address] Phone: [phone]   Fax:  [phone]  Name: DHANVIN SZETO MRN: [id number] Date of Birth: 1934/03/31

## 2019-07-10 ENCOUNTER — Encounter (INDEPENDENT_AMBULATORY_CARE_PROVIDER_SITE_OTHER): Payer: Self-pay | Admitting: Nurse Practitioner

## 2019-07-11 DIAGNOSIS — M47816 Spondylosis without myelopathy or radiculopathy, lumbar region: Secondary | ICD-10-CM | POA: Diagnosis not present

## 2019-07-12 ENCOUNTER — Other Ambulatory Visit: Payer: Self-pay

## 2019-07-12 ENCOUNTER — Ambulatory Visit: Payer: Medicare Other

## 2019-07-12 DIAGNOSIS — R2689 Other abnormalities of gait and mobility: Secondary | ICD-10-CM

## 2019-07-12 DIAGNOSIS — R2681 Unsteadiness on feet: Secondary | ICD-10-CM

## 2019-07-12 DIAGNOSIS — M6281 Muscle weakness (generalized): Secondary | ICD-10-CM | POA: Diagnosis not present

## 2019-07-12 DIAGNOSIS — H353122 Nonexudative age-related macular degeneration, left eye, intermediate dry stage: Secondary | ICD-10-CM | POA: Diagnosis not present

## 2019-07-12 DIAGNOSIS — R262 Difficulty in walking, not elsewhere classified: Secondary | ICD-10-CM | POA: Diagnosis not present

## 2019-07-12 NOTE — Therapy (Signed)
[place] MAIN [facility] SERVICES [address] [place], [place], [address] Phone: [phone]   Fax:  [phone]  Physical Therapy Treatment  Patient Details  Name: [name] MRN: [id number] Date of Birth: 1933-11-28 Referring Provider (PT): Eulogio Ditch   Encounter Date: 07/12/2019  PT End of Session - 07/12/19 0935    Visit Number  8    Number of Visits  16    Date for PT Re-Evaluation  08/12/19    Authorization Type  8/10 eval 10/16    PT Start Time  0929    PT Stop Time  1013    PT Time Calculation (min)  44 min    Equipment Utilized During Treatment  Gait belt    Activity Tolerance  Patient tolerated treatment well    Behavior During Therapy  [facility] for tasks assessed/performed       Past Medical History:  Diagnosis Date  . AAA (abdominal aortic aneurysm) ([place])   . Arthritis   . Atherosclerotic heart disease of native coronary artery without angina pectoris 1998   - s/p CABG, occluded SVG-D1; patent LIMA-LAD, SVG-OM, SVG- RCA  . Basal cell carcinoma of eyelid 01/03/2013   2019 - R side of Nose  . Benign neoplasm of ascending colon   . Benign neoplasm of descending colon   . Cancer [facility])    Skin cancer  . Colon polyps   . COPD (chronic obstructive pulmonary disease) ([place])   . Coronary artery disease   . Dementia ([place])   . Dysrhythmia    atrial fib  . Falls frequently   . GERD (gastroesophageal reflux disease)   . Hemorrhoid 02/10/2015  . History of hiatal hernia   . History of kidney stones   . HOH (hard of hearing)   . Hypertension   . Memory loss   . Osteoporosis   . PAF (paroxysmal atrial fibrillation) ([place])   . Prostate cancer ([place])   . Prostate disorder   . Sciatic pain    Chronic  . Sleep apnea    cpap  . Temporary cerebral vascular dysfunction 02/10/2015   Had negative work-up.  July, 2012.  No medication changes, had forgot them that day, got dehydrated.   . Urinary incontinence     Past Surgical  History:  Procedure Laterality Date  . ABDOMINAL AORTIC ENDOVASCULAR STENT GRAFT Bilateral 05/18/2019   Procedure: ABDOMINAL AORTIC ENDOVASCULAR STENT GRAFT;  Surgeon: Algernon Huxley, MD;  Location: [facility] ORS;  Service: Vascular;  Laterality: Bilateral;  . ANKLE SURGERY    . Arch aortogram and carotid aortogram  06/02/2006   Dr Oneida Alar did surgery  . BASAL CELL CARCINOMA EXCISION  04/2016   Dermatology  . CARDIAC CATHETERIZATION  01/30/1998    (Dr. Linard Millers): Native LAD & mRCA CTO. 90% D1. Mod Cx. SVG-D1 CTO. SVG-RCA & SVG-OM along with LIMA-LAD patent;  LV NORMAL Fxn  . CAROTID ENDARTERECTOMY Left Oct. 29, 2007   Dr. Oneida Alar:  . CATARACT EXTRACTION W/PHACO Right 03/05/2017   Procedure: CATARACT EXTRACTION PHACO AND INTRAOCULAR LENS PLACEMENT (IOC);  Surgeon: Leandrew Koyanagi, MD;  Location: [facility] ORS;  Service: Ophthalmology;  Laterality: Right;  [place] 00:58.5 AP% 10.6 CDE 6.20 Fluid Pack lot # [id number]  . CATARACT EXTRACTION W/PHACO Left 04/15/2017   Procedure: CATARACT EXTRACTION PHACO AND INTRAOCULAR LENS PLACEMENT ([place])  Left;  Surgeon: Leandrew Koyanagi, MD;  Location: [place];  Service: Ophthalmology;  Laterality: Left;  . COLONOSCOPY WITH PROPOFOL N/A 09/15/2017  Procedure: COLONOSCOPY WITH PROPOFOL;  Surgeon: Lucilla Lame, MD;  Location: [facility] ENDOSCOPY;  Service: Endoscopy;  Laterality: N/A;  . COLONOSCOPY WITH PROPOFOL N/A 10/26/2018   Procedure: COLONOSCOPY WITH PROPOFOL;  Surgeon: Lucilla Lame, MD;  Location: [facility] ENDOSCOPY;  Service: Endoscopy;  Laterality: N/A;  . CORONARY ARTERY BYPASS GRAFT  1998   LIMA-LAD, SVG-OM, SVG-RPDA, SVG-DIAG  . CPET / MET  12/2012   Mild chronotropic incompetence - read 82% of predicted; also reduced effort; peak VO2 15.7 / 75% (did not reach Max effort) -- suggested ischemic response in last 1.5 minutes of exercise. Normal pulmonary function on PFTs but poor response to  . DOPPLER ECHOCARDIOGRAPHY  11/20/2010   EF =>55%; LV norm mild aortic  scelorosis  . ENDOVASCULAR REPAIR/STENT GRAFT N/A 05/18/2019   Procedure: ENDOVASCULAR REPAIR/STENT GRAFT;  Surgeon: Algernon Huxley, MD;  Location: [place] CV LAB;  Service: Cardiovascular;  Laterality: N/A;  . ESOPHAGOGASTRODUODENOSCOPY (EGD) WITH PROPOFOL N/A 09/15/2017   Procedure: ESOPHAGOGASTRODUODENOSCOPY (EGD) WITH PROPOFOL;  Surgeon: Lucilla Lame, MD;  Location: [facility] ENDOSCOPY;  Service: Endoscopy;  Laterality: N/A;  . FRACTURE SURGERY Right 03/19/2015   wrist  . HIATAL HERNIA REPAIR    . ILIAC ARTERY STENT  12/15/2005   PTA and direct stenting rgt and lft common iliac arteries  . KYPHOPLASTY N/A 04/05/2019   Procedure: KYPHOPLASTY T12, L3, L4 L5;  Surgeon: Hessie Knows, MD;  Location: [facility] ORS;  Service: Orthopedics;  Laterality: N/A;  . NM GATED MYOVIEW ([place] HX)  12/2017   Low Risk - no ischemia or infarct.  EF > 65%. no RWMA  . NM MYOVIEW ([place] HX)  11/11/2010; February 2016   a) TM STRESS----NORMAL PERFUSION ,EF 68%; b) EF 50-55%. Normal LV function. No significant ischemia or infarction.  . OPEN REDUCTION INTERNAL FIXATION (ORIF) DISTAL RADIAL FRACTURE Left 01/13/2019   Procedure: OPEN REDUCTION INTERNAL FIXATION (ORIF) DISTAL  RADIAL FRACTURE - LEFT - SLEEP APNEA;  Surgeon: Hessie Knows, MD;  Location: [facility] ORS;  Service: Orthopedics;  Laterality: Left;  . ORIF ELBOW FRACTURE Right 01/01/2017   Procedure: OPEN REDUCTION INTERNAL FIXATION (ORIF) ELBOW/OLECRANON FRACTURE;  Surgeon: Hessie Knows, MD;  Location: [facility] ORS;  Service: Orthopedics;  Laterality: Right;  . ORIF WRIST FRACTURE Right 03/19/2015   Procedure: OPEN REDUCTION INTERNAL FIXATION (ORIF) WRIST FRACTURE;  Surgeon: Hessie Knows, MD;  Location: [facility] ORS;  Service: Orthopedics;  Laterality: Right;  . THORACIC AORTA - CAROTID ANGIOGRAM  October 2007   Dr. 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 TRANSTHORACIC ECHOCARDIOGRAM  February  2016   [facility]: Normal LV function. Dilated left atrium.    There were no vitals filed for this visit.  Subjective Assessment - 07/12/19 0933    Subjective  Patient reports no falls or LOB since last session. Has no pain this morning. Has been compliant with HEP. Has been performing long walks with just his cane.    Pertinent History  Patient referred to physical therapy for balance/strength issues s/p abdominal aortic aneurysm (AAA) without rupture repair on 05/18/19 .  Patient has been seen by this therapist in the past (last seen in march of 2020) Patient had a fall on 01/08/19, MRI [facility] dated 03/28/19 report acute compression fractures at T12, L3 and L4 as well as facet arthropathy at the lower lumbar levels and at L3-4 moderate central stenosis. Patient underwent injection on 10/6 at L4-5 and L5-S1. PMH includes AAA, arthritis, atherosclerotic  heart disease, (s/p CABG), benign neoplasm of ascending/descending colon, COPD, CAD, dementia, dysrhythmia, frequent falls, GERD, hemorrhoid, HOH, HTN, PAF, prostate cancer. Monday through Friday have 3 people come in different times from 730 to 12pm. Uses a walker occasionally now    Limitations  Walking;Standing;House hold activities;Other (comment);Lifting    How long can you sit comfortably?  20 minutes before back is painful    How long can you stand comfortably?  hour    How long can you walk comfortably?  using a walker about 30 minutes    Patient Stated Goals  patient would like to improve his balance and strenght.    Currently in Pain?  No/denies           TREATMENT:   Nustep Lvl 4 RPM> 60 for cardiovascular challenge 4 minutes    Neuromuscular Re-education: verbal cueing for task orientation and body mechanics for stability.     in //:  airex balance  Beam: side stepping in // bars x6 trials with decreasing UE support   airex pad one foot on airex pad one foot on 6" step static hold 30 seconds x 2 trials each LE    Balloon taps  reaching inside and outside BOS with reaction timing x 3 minutes  Bosu ball modified lunge 10x each LE, decreasing UE support; max cueing for body mechanics and positioning of trunk   Bosu ball: lateral modified lunge 10x each LE. Cueing for body mechanics and upright posture for optimal stability and muscle recruitment.   Bouncing ball in hallway 2x 86 ft with CGA ; two near LOB  Tossing ball in air in hallway 2x 86 ft with CGA ; excessive forward trunk lean resulting in near LOB requiring tactile cueing for upright posture.    Therex:   5x STS from standard height seat  Standing with # 3 ankle weight: CGA for stability: reduced to 3lb due to fatigue from ambulation    -Hip extension with bilateral upper extremity support, cueing for neutral hip alignment, upright posture for optimal muscle recruitment, and sequencing, 10x each LE,  -Hip abduction with bilateral upper extremity support, cueing for neutral foot alignment for correct muscle activation, 10x each LE   3lb ankle weights seated: -Marches 15x each LE, cueing for increasing arc of motion fo increased clearance.   -LAQ 12x each LE cueing for decreasing velocity fo rimproved control and muscle contraction.      Vitals monitored throughout session during rest periods to ensure O2 saturation return to functional range prior to initiation of each exercise. spO2 92% after leg press.     Pt educated throughout session about proper posture and technique with exercises. Improved exercise technique, movement at target joints, use of target muscles after min to mod verbal, visual, tactile cues.                      PT Education - 07/12/19 0935    Education provided  Yes    Education Details  exercise technique, body mechanics    Person(s) Educated  Patient    Methods  Explanation;Demonstration;Tactile cues;Handout    Comprehension  Verbalized understanding;Returned demonstration;Verbal cues required;Tactile cues  required       PT Short Term Goals - 06/17/19 1228      PT SHORT TERM GOAL #1   Title  Patient will be independent in home exercise program to improve strength/mobility for better functional independence with ADLs.    Baseline  10/16: HEP given  Time  4    Period  Weeks    Status  New    Target Date  07/15/19      PT SHORT TERM GOAL #2   Title  Patient will deny any falls over past 4 weeks to demonstrate improved safety awareness at home and work.    Baseline  10/16: one major fall    Time  4    Period  Weeks    Status  New    Target Date  07/15/19        PT Long Term Goals - 06/17/19 1229      PT LONG TERM GOAL #1   Title  Patient will increase BLE gross strength to 4+/5 as to improve functional strength for independent gait, increased standing tolerance and increased ADL ability.    Baseline  10/16: R gross 4-/5 with add/ext 3+/5 L grossly 3+/5 with hip add/ext 2+/5    Time  8    Period  Weeks    Status  New    Target Date  08/12/19      PT LONG TERM GOAL #2   Title  Patient (> 4 years old) will complete five times sit to stand test in < 15 seconds indicating an increased LE strength and improved balance.    Baseline  10/16: 17 seconds with heavy use of hands on knees    Time  8    Period  Weeks    Status  New    Target Date  08/12/19      PT LONG TERM GOAL #3   Title  Patient will increase Berg Balance score by > 6 points (43/56)  to demonstrate decreased fall risk during functional activities.    Baseline  10/16: 37/56    Time  8    Period  Weeks    Status  New    Target Date  08/12/19      PT LONG TERM GOAL #4   Title  Patient will increase 10 meter walk test to >1.41ms as to improve gait speed for better community ambulation and to reduce fall risk.    Baseline  10/16: 0.91 w/o AD    Time  8    Period  Weeks    Status  New    Target Date  08/12/19      PT LONG TERM GOAL #5   Title  Patient will increase ABC scale score >80% to demonstrate better  functional mobility and better confidence with ADLs.    Baseline  10/16; 71%    Time  8    Period  Weeks    Status  New    Target Date  08/12/19            Plan - 07/12/19 0957    Clinical Impression Statement  Patient is challenged with dual tasking due to cognitive component of task. Is improving with stability and ability to retain COM with single task interventions. Patient is limited in capacity for functional mobility requiring frequent rest breaks due to fatigue.  The patient would benefit from continued skilled PT intervention to maximize mobility, safety, and functional abilities.    Personal Factors and Comorbidities  Age;Comorbidity 3+;Fitness;Past/Current Experience;Sex;Social Background;Time since onset of injury/illness/exacerbation;Transportation    Comorbidities  AAA, arthritis, atherosclerotic heart disease, (s/p CABG), benign neoplasm of ascending/descending colon, COPD, CAD, dementia, dysrhythmia, frequent falls, GERD, hemorrhoid, HOH, HTN, PAF, prostate cancer.    Examination-Activity Limitations  Bathing;Bend;Carry;Dressing;Continence;Stairs;Squat;Reach Overhead;Locomotion Level;Lift;Stand;Transfers;Toileting  Examination-Participation Restrictions  Church;Cleaning;Community Activity;Driving;Interpersonal Relationship;Laundry;Volunteer;Shop;Personal Finances;Meal Prep;Yard Work    [employer] Potential  Fair    Clinical Impairments Affecting Rehab Potential  (+) good support system, high prior level of function (-) currently undergoing radiation therapy for prostate cancer, memory deficits     PT Frequency  2x / week    PT Duration  8 weeks    PT Treatment/Interventions  ADLs/Self Care Home Management;Aquatic Therapy;Cryotherapy;Moist Heat;Traction;Ultrasound;Therapeutic activities;Functional mobility training;Stair training;Gait training;DME Instruction;Therapeutic exercise;Balance training;Neuromuscular re-education;Patient/family education;Manual techniques;Passive range  of motion;Energy conservation;Vestibular;Taping    PT Next Visit Plan  progress dynamic stability    PT Home Exercise Plan  see above    Consulted and Agree with Plan of Care  Patient       Patient will benefit from skilled therapeutic intervention in order to improve the following deficits and impairments:  Abnormal gait, Cardiopulmonary status limiting activity, Decreased activity tolerance, Decreased balance, Decreased knowledge of precautions, Decreased endurance, Decreased coordination, Decreased cognition, Decreased knowledge of use of DME, Decreased mobility, Difficulty walking, Decreased safety awareness, Decreased strength, Impaired flexibility, Impaired perceived functional ability, Impaired sensation, Postural dysfunction, Improper body mechanics, Pain  Visit Diagnosis: Unsteadiness on feet  Muscle weakness (generalized)  Other abnormalities of gait and mobility     Problem List Patient Active Problem List   Diagnosis Date Noted  . Status post abdominal aortic aneurysm (AAA) repair 04/15/2019  . Candidiasis 02/22/2019  . Malignant neoplasm of descending colon ([place])   . Benign neoplasm of descending colon   . Benign neoplasm of transverse colon   . Do not intubate, cardiopulmonary resuscitation (CPR)-only code status 10/08/2018  . Prostate cancer ([place]) 02/25/2018  . Prostatic cyst 01/15/2018  . 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 ([place]) 09/15/2017  . Blood in stool   . Abnormal feces   . Stricture and stenosis of esophagus   . Mild cognitive impairment 08/18/2017  . Senile purpura ([place]) 04/29/2017  . Anemia 04/29/2017  . Frequent falls 04/29/2017  . Acute respiratory failure ([place]) 12/17/2016  . Simple chronic bronchitis ([place]) 12/17/2016  . Benign localized hyperplasia of prostate with urinary obstruction 07/15/2016  . Elevated PSA 07/15/2016  . Incomplete emptying of bladder 07/15/2016  . Urge incontinence 07/15/2016  . Hypothyroidism 04/25/2016  . Macular  degeneration 04/25/2016  . Erectile dysfunction due to arterial insufficiency   . Ischemic heart disease with chronotropic incompetence   . CN (constipation) 02/10/2015  . DD (diverticular disease) 02/10/2015  . Fatigue 02/10/2015  . Lichen planus 94/17/4081  . Restless leg 02/10/2015  . 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 B12 deficiency 02/10/2015  . COPD (chronic obstructive pulmonary disease) ([place]) 11/14/2014  . Post Inflammatory Lung Changes 11/14/2014  . PAF (paroxysmal atrial fibrillation) ([facility]) CHA2DS2-VASc = 4. AC = Eliquis   . Insomnia 12/09/2013  . OSA on CPAP   . Dyspnea on exertion - -essentially resolved with BB dose reduction & wgt loss 03/29/2013  . Overweight (BMI 25.0-29.9) -- Wgt back up 01/17/2013  . Atherosclerotic heart disease of native coronary artery without angina pectoris - s/p CABG, occluded SVG-D1; patent LIMA-LAD, SVG-OM, SVG- RCA   . Essential hypertension   . Hyperlipidemia with target LDL less than 70   . Aorto-iliac disease ([place])   . BCC (basal cell carcinoma), eyelid 01/03/2013  . Allergic rhinitis 08/17/2009  . Adaptation reaction 05/28/2009  . Acid reflux 05/28/2009  . Arthritis, degenerative 05/28/2009  . 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 Carotid artery occlusion without infarction 06/01/2006  . PAD (  peripheral artery disease) - bilateral common iliac stents 12/15/2005  . Aortoiliac occlusive disease ([place]) 11/30/2005  . Atherosclerotic heart disease of artery bypass graft 09/01/1997   Janna Arch, PT, DPT   07/12/2019, 10:15 AM  [place] MAIN [facility] SERVICES [address] [place], [place], [address] Phone: [phone]   Fax:  [phone]  Name: [name] MRN: [id number] Date of Birth: [birth year]

## 2019-07-15 ENCOUNTER — Ambulatory Visit: Payer: Medicare Other

## 2019-07-15 ENCOUNTER — Other Ambulatory Visit: Payer: Self-pay

## 2019-07-15 DIAGNOSIS — R2689 Other abnormalities of gait and mobility: Secondary | ICD-10-CM

## 2019-07-15 DIAGNOSIS — M6281 Muscle weakness (generalized): Secondary | ICD-10-CM | POA: Diagnosis not present

## 2019-07-15 DIAGNOSIS — R2681 Unsteadiness on feet: Secondary | ICD-10-CM | POA: Diagnosis not present

## 2019-07-15 DIAGNOSIS — R262 Difficulty in walking, not elsewhere classified: Secondary | ICD-10-CM | POA: Diagnosis not present

## 2019-07-15 NOTE — Therapy (Signed)
[place] MAIN [facility] SERVICES [address] [place], [place], [address] Phone: [phone]   Fax:  [phone]  Physical Therapy Treatment  Patient Details  Name: [name] MRN: [id number] Date of Birth: 04/17/1934 Referring Provider (PT): Eulogio Ditch   Encounter Date: 07/15/2019  PT End of Session - 07/15/19 0941    Visit Number  9    Number of Visits  16    Date for PT Re-Evaluation  08/12/19    Authorization Type  9/10 eval 10/16    PT Start Time  0937    PT Stop Time  1023    PT Time Calculation (min)  46 min    Equipment Utilized During Treatment  Gait belt    Activity Tolerance  Patient tolerated treatment well    Behavior During Therapy  [facility] for tasks assessed/performed       Past Medical History:  Diagnosis Date  . AAA (abdominal aortic aneurysm) ([place])   . Arthritis   . Atherosclerotic heart disease of native coronary artery without angina pectoris 1998   - s/p CABG, occluded SVG-D1; patent LIMA-LAD, SVG-OM, SVG- RCA  . Basal cell carcinoma of eyelid 01/03/2013   2019 - R side of Nose  . Benign neoplasm of ascending colon   . Benign neoplasm of descending colon   . Cancer [facility])    Skin cancer  . Colon polyps   . COPD (chronic obstructive pulmonary disease) ([place])   . Coronary artery disease   . Dementia ([place])   . Dysrhythmia    atrial fib  . Falls frequently   . GERD (gastroesophageal reflux disease)   . Hemorrhoid 02/10/2015  . History of hiatal hernia   . History of kidney stones   . HOH (hard of hearing)   . Hypertension   . Memory loss   . Osteoporosis   . PAF (paroxysmal atrial fibrillation) ([place])   . Prostate cancer ([place])   . Prostate disorder   . Sciatic pain    Chronic  . Sleep apnea    cpap  . Temporary cerebral vascular dysfunction 02/10/2015   Had negative work-up.  July, 2012.  No medication changes, had forgot them that day, got dehydrated.   . Urinary incontinence     Past Surgical  History:  Procedure Laterality Date  . ABDOMINAL AORTIC ENDOVASCULAR STENT GRAFT Bilateral 05/18/2019   Procedure: ABDOMINAL AORTIC ENDOVASCULAR STENT GRAFT;  Surgeon: Algernon Huxley, MD;  Location: [facility] ORS;  Service: Vascular;  Laterality: Bilateral;  . ANKLE SURGERY    . Arch aortogram and carotid aortogram  06/02/2006   Dr Oneida Alar did surgery  . BASAL CELL CARCINOMA EXCISION  04/2016   Dermatology  . CARDIAC CATHETERIZATION  01/30/1998    (Dr. Linard Millers): Native LAD & mRCA CTO. 90% D1. Mod Cx. SVG-D1 CTO. SVG-RCA & SVG-OM along with LIMA-LAD patent;  LV NORMAL Fxn  . CAROTID ENDARTERECTOMY Left Oct. 29, 2007   Dr. Oneida Alar:  . CATARACT EXTRACTION W/PHACO Right 03/05/2017   Procedure: CATARACT EXTRACTION PHACO AND INTRAOCULAR LENS PLACEMENT (IOC);  Surgeon: Leandrew Koyanagi, MD;  Location: [facility] ORS;  Service: Ophthalmology;  Laterality: Right;  [place] 00:58.5 AP% 10.6 CDE 6.20 Fluid Pack lot # [id number]  . CATARACT EXTRACTION W/PHACO Left 04/15/2017   Procedure: CATARACT EXTRACTION PHACO AND INTRAOCULAR LENS PLACEMENT ([place])  Left;  Surgeon: Leandrew Koyanagi, MD;  Location: [place];  Service: Ophthalmology;  Laterality: Left;  . COLONOSCOPY WITH PROPOFOL N/A 09/15/2017  Procedure: COLONOSCOPY WITH PROPOFOL;  Surgeon: Lucilla Lame, MD;  Location[facility] ENDOSCOPY;  Service: Endoscopy;  Laterality: N/A;  . COLONOSCOPY WITH PROPOFOL N/A 10/26/2018   Procedure: COLONOSCOPY WITH PROPOFOL;  Surgeon: Lucilla Lame, MD;  Location: [facility] ENDOSCOPY;  Service: Endoscopy;  Laterality: N/A;  . CORONARY ARTERY BYPASS GRAFT  1998   LIMA-LAD, SVG-OM, SVG-RPDA, SVG-DIAG  . CPET / MET  12/2012   Mild chronotropic incompetence - read 82% of predicted; also reduced effort; peak VO2 15.7 / 75% (did not reach Max effort) -- suggested ischemic response in last 1.5 minutes of exercise. Normal pulmonary function on PFTs but poor response to  . DOPPLER ECHOCARDIOGRAPHY  11/20/2010   EF =>55%; LV norm mild aortic  scelorosis  . ENDOVASCULAR REPAIR/STENT GRAFT N/A 05/18/2019   Procedure: ENDOVASCULAR REPAIR/STENT GRAFT;  Surgeon: Algernon Huxley, MD;  Location: [place] CV LAB;  Service: Cardiovascular;  Laterality: N/A;  . ESOPHAGOGASTRODUODENOSCOPY (EGD) WITH PROPOFOL N/A 09/15/2017   Procedure: ESOPHAGOGASTRODUODENOSCOPY (EGD) WITH PROPOFOL;  Surgeon: Lucilla Lame, MD;  Location: [facility] ENDOSCOPY;  Service: Endoscopy;  Laterality: N/A;  . FRACTURE SURGERY Right 03/19/2015   wrist  . HIATAL HERNIA REPAIR    . ILIAC ARTERY STENT  12/15/2005   PTA and direct stenting rgt and lft common iliac arteries  . KYPHOPLASTY N/A 04/05/2019   Procedure: KYPHOPLASTY T12, L3, L4 L5;  Surgeon: Hessie Knows, MD;  Location: [facility] ORS;  Service: Orthopedics;  Laterality: N/A;  . NM GATED MYOVIEW ([place] [facility])  12/2017   Low Risk - no ischemia or infarct.  EF > 65%. no RWMA  . NM MYOVIEW ([place] [facility])  11/11/2010; February 2016   a) TM STRESS----NORMAL PERFUSION ,EF 68%; b) EF 50-55%. Normal LV function. No significant ischemia or infarction.  . OPEN REDUCTION INTERNAL FIXATION (ORIF) DISTAL RADIAL FRACTURE Left 01/13/2019   Procedure: OPEN REDUCTION INTERNAL FIXATION (ORIF) DISTAL  RADIAL FRACTURE - LEFT - SLEEP APNEA;  Surgeon: Hessie Knows, MD;  Location: [facility] ORS;  Service: Orthopedics;  Laterality: Left;  . ORIF ELBOW FRACTURE Right 01/01/2017   Procedure: OPEN REDUCTION INTERNAL FIXATION (ORIF) ELBOW/OLECRANON FRACTURE;  Surgeon: Hessie Knows, MD;  Location: [facility] ORS;  Service: Orthopedics;  Laterality: Right;  . ORIF WRIST FRACTURE Right 03/19/2015   Procedure: OPEN REDUCTION INTERNAL FIXATION (ORIF) WRIST FRACTURE;  Surgeon: Hessie Knows, MD;  Location: [facility] ORS;  Service: Orthopedics;  Laterality: Right;  . THORACIC AORTA - CAROTID ANGIOGRAM  October 2007   Dr. 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 TRANSTHORACIC ECHOCARDIOGRAM  February  2016   [facility]: Normal LV function. Dilated left atrium.    There were no vitals filed for this visit.  Subjective Assessment - 07/15/19 0940    Subjective  Patient reports he hasn't been able to do his daily walks due to the weather.  No falls or LOB since last session. No pain or complaints at this time.    Pertinent History  Patient referred to physical therapy for balance/strength issues s/p abdominal aortic aneurysm (AAA) without rupture repair on 05/18/19 .  Patient has been seen by this therapist in the past (last seen in march of 2020) Patient had a fall on 01/08/19, MRI from [facility] dated 03/28/19 report acute compression fractures at T12, L3 and L4 as well as facet arthropathy at the lower lumbar levels and at L3-4 moderate central stenosis. Patient underwent injection on 10/6 at L4-5 and L5-S1. PMH includes AAA,  arthritis, atherosclerotic heart disease, (s/p CABG), benign neoplasm of ascending/descending colon, COPD, CAD, dementia, dysrhythmia, frequent falls, GERD, hemorrhoid, HOH, HTN, PAF, prostate cancer. Monday through Friday have 3 people come in different times from 730 to 12pm. Uses a walker occasionally now    Limitations  Walking;Standing;House hold activities;Other (comment);Lifting    How long can you sit comfortably?  20 minutes before back is painful    How long can you stand comfortably?  hour    How long can you walk comfortably?  using a walker about 30 minutes    Patient Stated Goals  patient would like to improve his balance and strenght.    Currently in Pain?  No/denies          TREATMENT:   Nustep Lvl 4 RPM> 60 for cardiovascular challenge 4 minutes    Neuromuscular Re-education: verbal cueing for task orientation and body mechanics for stability.     in //:  airex balance  Beam: side stepping in // bars x6 trials with decreasing UE support   airex pad one foot on airex pad one foot on 6" step static hold 30 seconds x 2 trials each LE    Balloon taps reaching  inside and outside BOS with reaction timing x 3 minutes   Opposite UE/LE raises 10x each LE    Therex:  10x STS lifting weighted ball to sky at top of stand from plinth table   6" step ups 10x each LE ; occasional use of hand for stabilization, very challenging to patient.   Side step up/down 6" step SUE support, 10x each direction.    Standing with # 4 ankle weight: CGA for stability: reduced to 3lb due to fatigue from ambulation  -Hip extension with bilateral upper extremity support, cueing for neutral hip alignment, upright posture for optimal muscle recruitment, and sequencing, 12x each LE,  -Hip abduction with bilateral upper extremity support, cueing for neutral foot alignment for correct muscle activation, 10x each LE     4lb ankle weights seated: -Marches 15x each LE, cueing for increasing arc of motion fo increased clearance.   -LAQ 12x each LE cueing for decreasing velocity fo rimproved control and muscle contraction.    GTB hamstring curl 15x each LE seated GTB adduction 12x each LE, PT holding opp side of band for optimal resistance   Seated df/pf 20x  Vitals monitored throughout session during rest periods to ensure O2 saturation return to functional range prior to initiation of each exercise.      Pt educated throughout session about proper posture and technique with exercises. Improved exercise technique, movement at target joints, use of target muscles after min to mod verbal, visual, tactile cues.                      PT Education - 07/15/19 0941    Education provided  Yes    Education Details  exercise technique, body mechanics    Person(s) Educated  Patient    Methods  Explanation;Demonstration;Tactile cues;Verbal cues    Comprehension  Verbalized understanding;Returned demonstration;Verbal cues required;Tactile cues required       PT Short Term Goals - 06/17/19 1228      PT SHORT TERM GOAL #1   Title  Patient will be independent in home  exercise program to improve strength/mobility for better functional independence with ADLs.    Baseline  10/16: HEP given    Time  4    Period  Weeks  Status  New    Target Date  07/15/19      PT SHORT TERM GOAL #2   Title  Patient will deny any falls over past 4 weeks to demonstrate improved safety awareness at home and work.    Baseline  10/16: one major fall    Time  4    Period  Weeks    Status  New    Target Date  07/15/19        PT Long Term Goals - 06/17/19 1229      PT LONG TERM GOAL #1   Title  Patient will increase BLE gross strength to 4+/5 as to improve functional strength for independent gait, increased standing tolerance and increased ADL ability.    Baseline  10/16: R gross 4-/5 with add/ext 3+/5 L grossly 3+/5 with hip add/ext 2+/5    Time  8    Period  Weeks    Status  New    Target Date  08/12/19      PT LONG TERM GOAL #2   Title  Patient (> 28 years old) will complete five times sit to stand test in < 15 seconds indicating an increased LE strength and improved balance.    Baseline  10/16: 17 seconds with heavy use of hands on knees    Time  8    Period  Weeks    Status  New    Target Date  08/12/19      PT LONG TERM GOAL #3   Title  Patient will increase Berg Balance score by > 6 points (43/56)  to demonstrate decreased fall risk during functional activities.    Baseline  10/16: 37/56    Time  8    Period  Weeks    Status  New    Target Date  08/12/19      PT LONG TERM GOAL #4   Title  Patient will increase 10 meter walk test to >1.49ms as to improve gait speed for better community ambulation and to reduce fall risk.    Baseline  10/16: 0.91 w/o AD    Time  8    Period  Weeks    Status  New    Target Date  08/12/19      PT LONG TERM GOAL #5   Title  Patient will increase ABC scale score >80% to demonstrate better functional mobility and better confidence with ADLs.    Baseline  10/16; 71%    Time  8    Period  Weeks    Status  New     Target Date  08/12/19            Plan - 07/15/19 1000    Clinical Impression Statement  Patient is challenged with prolonged dynamic cardiovascular challenging interventions such as step ups due to fatigue with repeated motion. Vitals remain within therapeutic range throughout session.  Patient progressing with functional strength and stability at this time with excellent motivation. The patient would benefit from continued skilled PT intervention to maximize mobility, safety, and functional abilities.    Personal Factors and Comorbidities  Age;Comorbidity 3+;Fitness;Past/Current Experience;Sex;Social Background;Time since onset of injury/illness/exacerbation;Transportation    Comorbidities  AAA, arthritis, atherosclerotic heart disease, (s/p CABG), benign neoplasm of ascending/descending colon, COPD, CAD, dementia, dysrhythmia, frequent falls, GERD, hemorrhoid, HOH, HTN, PAF, prostate cancer.    Examination-Activity Limitations  Bathing;Bend;Carry;Dressing;Continence;Stairs;Squat;Reach Overhead;Locomotion Level;Lift;Stand;Transfers;Toileting    Examination-Participation Restrictions  Church;Cleaning;Community Activity;Driving;Interpersonal Relationship;Laundry;Volunteer;Shop;Personal Finances;Meal Prep;YSaks IncorporatedWork    [employer]  Potential  Fair    Clinical Impairments Affecting Rehab Potential  (+) good support system, high prior level of function (-) currently undergoing radiation therapy for prostate cancer, memory deficits     PT Frequency  2x / week    PT Duration  8 weeks    PT Treatment/Interventions  ADLs/Self Care Home Management;Aquatic Therapy;Cryotherapy;Moist Heat;Traction;Ultrasound;Therapeutic activities;Functional mobility training;Stair training;Gait training;DME Instruction;Therapeutic exercise;Balance training;Neuromuscular re-education;Patient/family education;Manual techniques;Passive range of motion;Energy conservation;Vestibular;Taping    PT Next Visit Plan  progress dynamic  stability    PT Home Exercise Plan  see above    Consulted and Agree with Plan of Care  Patient       Patient will benefit from skilled therapeutic intervention in order to improve the following deficits and impairments:  Abnormal gait, Cardiopulmonary status limiting activity, Decreased activity tolerance, Decreased balance, Decreased knowledge of precautions, Decreased endurance, Decreased coordination, Decreased cognition, Decreased knowledge of use of DME, Decreased mobility, Difficulty walking, Decreased safety awareness, Decreased strength, Impaired flexibility, Impaired perceived functional ability, Impaired sensation, Postural dysfunction, Improper body mechanics, Pain  Visit Diagnosis: Unsteadiness on feet  Muscle weakness (generalized)  Other abnormalities of gait and mobility     Problem List Patient Active Problem List   Diagnosis Date Noted  . Status post abdominal aortic aneurysm (AAA) repair 04/15/2019  . Candidiasis 02/22/2019  . Malignant neoplasm of descending colon ([place])   . Benign neoplasm of descending colon   . Benign neoplasm of transverse colon   . Do not intubate, cardiopulmonary resuscitation (CPR)-only code status 10/08/2018  . Prostate cancer ([place]) 02/25/2018  . Prostatic cyst 01/15/2018  . 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 ([place]) 09/15/2017  . Blood in stool   . Abnormal feces   . Stricture and stenosis of esophagus   . Mild cognitive impairment 08/18/2017  . Senile purpura ([place]) 04/29/2017  . Anemia 04/29/2017  . Frequent falls 04/29/2017  . Acute respiratory failure ([place]) 12/17/2016  . Simple chronic bronchitis ([place]) 12/17/2016  . Benign localized hyperplasia of prostate with urinary obstruction 07/15/2016  . Elevated PSA 07/15/2016  . Incomplete emptying of bladder 07/15/2016  . Urge incontinence 07/15/2016  . Hypothyroidism 04/25/2016  . Macular degeneration 04/25/2016  . Erectile dysfunction due to arterial insufficiency   . Ischemic heart  disease with chronotropic incompetence   . CN (constipation) 02/10/2015  . DD (diverticular disease) 02/10/2015  . Fatigue 02/10/2015  . Lichen planus 13/24/4010  . Restless leg 02/10/2015  . 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 B12 deficiency 02/10/2015  . COPD (chronic obstructive pulmonary disease) ([place]) 11/14/2014  . Post Inflammatory Lung Changes 11/14/2014  . PAF (paroxysmal atrial fibrillation) ([facility]) CHA2DS2-VASc = 4. AC = Eliquis   . Insomnia 12/09/2013  . OSA on CPAP   . Dyspnea on exertion - -essentially resolved with BB dose reduction & wgt loss 03/29/2013  . Overweight (BMI 25.0-29.9) -- Wgt back up 01/17/2013  . Atherosclerotic heart disease of native coronary artery without angina pectoris - s/p CABG, occluded SVG-D1; patent LIMA-LAD, SVG-OM, SVG- RCA   . Essential hypertension   . Hyperlipidemia with target LDL less than 70   . Aorto-iliac disease ([place])   . BCC (basal cell carcinoma), eyelid 01/03/2013  . Allergic rhinitis 08/17/2009  . Adaptation reaction 05/28/2009  . Acid reflux 05/28/2009  . Arthritis, degenerative 05/28/2009  . 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 Carotid artery occlusion without infarction 06/01/2006  . PAD (peripheral artery disease) - bilateral common iliac stents 12/15/2005  . Aortoiliac occlusive  disease ([place]) 11/30/2005  . Atherosclerotic heart disease of artery bypass graft 09/01/1997   Janna Arch, PT, DPT   07/15/2019, 10:24 AM  [place] MAIN [facility] SERVICES [address] [place], [place], [address] Phone: [phone]   Fax:  [phone]  Name: STONY STEGMANN MRN: [id number] Date of Birth: 28-Jun-1934

## 2019-07-19 ENCOUNTER — Ambulatory Visit: Payer: Medicare Other

## 2019-07-19 ENCOUNTER — Other Ambulatory Visit: Payer: Self-pay

## 2019-07-19 DIAGNOSIS — R2689 Other abnormalities of gait and mobility: Secondary | ICD-10-CM | POA: Diagnosis not present

## 2019-07-19 DIAGNOSIS — R2681 Unsteadiness on feet: Secondary | ICD-10-CM

## 2019-07-19 DIAGNOSIS — M6281 Muscle weakness (generalized): Secondary | ICD-10-CM | POA: Diagnosis not present

## 2019-07-19 DIAGNOSIS — R262 Difficulty in walking, not elsewhere classified: Secondary | ICD-10-CM | POA: Diagnosis not present

## 2019-07-19 NOTE — Therapy (Signed)
[place] MAIN [facility] SERVICES [address] [place], [place], [address] Phone: [phone]   Fax:  [phone]  Physical Therapy Treatment Physical Therapy Progress Note   Dates of reporting period  06/17/19   to   07/19/19   Patient Details  Name: [name] MRN: [id number] Date of Birth: 08-13-1934 Referring Provider (PT): Eulogio Ditch   Encounter Date: 07/19/2019  PT End of Session - 07/19/19 0935    Visit Number  10    Number of Visits  16    Date for PT Re-Evaluation  08/12/19    Authorization Type  10/10 eval 10/16; next session 1/10 PN 11/17    PT Start Time  0929    PT Stop Time  1013    PT Time Calculation (min)  44 min    Equipment Utilized During Treatment  Gait belt    Activity Tolerance  Patient tolerated treatment well    Behavior During Therapy  [facility] for tasks assessed/performed       Past Medical History:  Diagnosis Date  . AAA (abdominal aortic aneurysm) ([place])   . Arthritis   . Atherosclerotic heart disease of native coronary artery without angina pectoris 1998   - s/p CABG, occluded SVG-D1; patent LIMA-LAD, SVG-OM, SVG- RCA  . Basal cell carcinoma of eyelid 01/03/2013   2019 - R side of Nose  . Benign neoplasm of ascending colon   . Benign neoplasm of descending colon   . Cancer [facility])    Skin cancer  . Colon polyps   . COPD (chronic obstructive pulmonary disease) ([place])   . Coronary artery disease   . Dementia ([place])   . Dysrhythmia    atrial fib  . Falls frequently   . GERD (gastroesophageal reflux disease)   . Hemorrhoid 02/10/2015  . History of hiatal hernia   . History of kidney stones   . HOH (hard of hearing)   . Hypertension   . Memory loss   . Osteoporosis   . PAF (paroxysmal atrial fibrillation) ([place])   . Prostate cancer ([place])   . Prostate disorder   . Sciatic pain    Chronic  . Sleep apnea    cpap  . Temporary cerebral vascular dysfunction 02/10/2015   Had negative work-up.  July,  2012.  No medication changes, had forgot them that day, got dehydrated.   . Urinary incontinence     Past Surgical History:  Procedure Laterality Date  . ABDOMINAL AORTIC ENDOVASCULAR STENT GRAFT Bilateral 05/18/2019   Procedure: ABDOMINAL AORTIC ENDOVASCULAR STENT GRAFT;  Surgeon: Algernon Huxley, MD;  Location: [facility] ORS;  Service: Vascular;  Laterality: Bilateral;  . ANKLE SURGERY    . Arch aortogram and carotid aortogram  06/02/2006   Dr Oneida Alar did surgery  . BASAL CELL CARCINOMA EXCISION  04/2016   Dermatology  . CARDIAC CATHETERIZATION  01/30/1998    (Dr. Linard Millers): Native LAD & mRCA CTO. 90% D1. Mod Cx. SVG-D1 CTO. SVG-RCA & SVG-OM along with LIMA-LAD patent;  LV NORMAL Fxn  . CAROTID ENDARTERECTOMY Left Oct. 29, 2007   Dr. Oneida Alar:  . CATARACT EXTRACTION W/PHACO Right 03/05/2017   Procedure: CATARACT EXTRACTION PHACO AND INTRAOCULAR LENS PLACEMENT (IOC);  Surgeon: Leandrew Koyanagi, MD;  Location: [facility] ORS;  Service: Ophthalmology;  Laterality: Right;  [place] 00:58.5 AP% 10.6 CDE 6.20 Fluid Pack lot # [id number]  . CATARACT EXTRACTION W/PHACO Left 04/15/2017   Procedure: CATARACT EXTRACTION PHACO AND INTRAOCULAR LENS PLACEMENT (IOC)  Left;  Surgeon: Leandrew Koyanagi, MD;  Location: [place];  Service: Ophthalmology;  Laterality: Left;  . COLONOSCOPY WITH PROPOFOL N/A 09/15/2017   Procedure: COLONOSCOPY WITH PROPOFOL;  Surgeon: Lucilla Lame, MD;  Location: [facility] ENDOSCOPY;  Service: Endoscopy;  Laterality: N/A;  . COLONOSCOPY WITH PROPOFOL N/A 10/26/2018   Procedure: COLONOSCOPY WITH PROPOFOL;  Surgeon: Lucilla Lame, MD;  Location: [facility] ENDOSCOPY;  Service: Endoscopy;  Laterality: N/A;  . CORONARY ARTERY BYPASS GRAFT  1998   LIMA-LAD, SVG-OM, SVG-RPDA, SVG-DIAG  . CPET / MET  12/2012   Mild chronotropic incompetence - read 82% of predicted; also reduced effort; peak VO2 15.7 / 75% (did not reach Max effort) -- suggested ischemic response in last 1.5 minutes of exercise. Normal  pulmonary function on PFTs but poor response to  . DOPPLER ECHOCARDIOGRAPHY  11/20/2010   EF =>55%; LV norm mild aortic scelorosis  . ENDOVASCULAR REPAIR/STENT GRAFT N/A 05/18/2019   Procedure: ENDOVASCULAR REPAIR/STENT GRAFT;  Surgeon: Algernon Huxley, MD;  Location: [place] CV LAB;  Service: Cardiovascular;  Laterality: N/A;  . ESOPHAGOGASTRODUODENOSCOPY (EGD) WITH PROPOFOL N/A 09/15/2017   Procedure: ESOPHAGOGASTRODUODENOSCOPY (EGD) WITH PROPOFOL;  Surgeon: Lucilla Lame, MD;  Location: [facility] ENDOSCOPY;  Service: Endoscopy;  Laterality: N/A;  . FRACTURE SURGERY Right 03/19/2015   wrist  . HIATAL HERNIA REPAIR    . ILIAC ARTERY STENT  12/15/2005   PTA and direct stenting rgt and lft common iliac arteries  . KYPHOPLASTY N/A 04/05/2019   Procedure: KYPHOPLASTY T12, L3, L4 L5;  Surgeon: Hessie Knows, MD;  Location: [facility] ORS;  Service: Orthopedics;  Laterality: N/A;  . NM GATED MYOVIEW ([place] HX)  12/2017   Low Risk - no ischemia or infarct.  EF > 65%. no RWMA  . NM MYOVIEW ([place] HX)  11/11/2010; February 2016   a) TM STRESS----NORMAL PERFUSION ,EF 68%; b) EF 50-55%. Normal LV function. No significant ischemia or infarction.  . OPEN REDUCTION INTERNAL FIXATION (ORIF) DISTAL RADIAL FRACTURE Left 01/13/2019   Procedure: OPEN REDUCTION INTERNAL FIXATION (ORIF) DISTAL  RADIAL FRACTURE - LEFT - SLEEP APNEA;  Surgeon: Hessie Knows, MD;  Location: [facility] ORS;  Service: Orthopedics;  Laterality: Left;  . ORIF ELBOW FRACTURE Right 01/01/2017   Procedure: OPEN REDUCTION INTERNAL FIXATION (ORIF) ELBOW/OLECRANON FRACTURE;  Surgeon: Hessie Knows, MD;  Location: [facility] ORS;  Service: Orthopedics;  Laterality: Right;  . ORIF WRIST FRACTURE Right 03/19/2015   Procedure: OPEN REDUCTION INTERNAL FIXATION (ORIF) WRIST FRACTURE;  Surgeon: Hessie Knows, MD;  Location: [facility] ORS;  Service: Orthopedics;  Laterality: Right;  . THORACIC AORTA - CAROTID ANGIOGRAM  October 2007   Dr. 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 TRANSTHORACIC ECHOCARDIOGRAM  February 2016   [facility]: Normal LV function. Dilated left atrium.    There were no vitals filed for this visit.  Subjective Assessment - 07/19/19 0934    Subjective  Patient reports occasional R knee pain. Pain is worsened in the night. No falls or LOB since last session.    Pertinent History  Patient referred to physical therapy for balance/strength issues s/p abdominal aortic aneurysm (AAA) without rupture repair on 05/18/19 .  Patient has been seen by this therapist in the past (last seen in march of 2020) Patient had a fall on 01/08/19, MRI from [facility] dated 03/28/19 report acute compression fractures at T12, L3 and L4 as well as facet arthropathy at the lower lumbar levels and at L3-4 moderate central  stenosis. Patient underwent injection on 10/6 at L4-5 and L5-S1. PMH includes AAA, arthritis, atherosclerotic heart disease, (s/p CABG), benign neoplasm of ascending/descending colon, COPD, CAD, dementia, dysrhythmia, frequent falls, GERD, hemorrhoid, HOH, HTN, PAF, prostate cancer. Monday through Friday have 3 people come in different times from 730 to 12pm. Uses a walker occasionally now    Limitations  Walking;Standing;House hold activities;Other (comment);Lifting    How long can you sit comfortably?  20 minutes before back is painful    How long can you stand comfortably?  hour    How long can you walk comfortably?  using a walker about 30 minutes    Patient Stated Goals  patient would like to improve his balance and strenght.    Currently in Pain?  Yes    Pain Score  2     Pain Location  Knee    Pain Orientation  Right    Pain Descriptors / Indicators  Aching    Pain Type  Chronic pain    Pain Onset  1 to 4 weeks ago    Pain Frequency  Intermittent    Aggravating Factors   worse at night         [facility] PT Assessment - 07/19/19 0001      Standardized Balance Assessment    Standardized Balance Assessment  Berg Balance Test      Berg Balance Test   Sit to Stand  Able to stand  independently using hands    Standing Unsupported  Able to stand safely 2 minutes    Sitting with Back Unsupported but Feet Supported on Floor or Stool  Able to sit safely and securely 2 minutes    Stand to Sit  Sits safely with minimal use of hands    Transfers  Able to transfer safely, definite need of hands    Standing Unsupported with Eyes Closed  Able to stand 10 seconds with supervision    Standing Unsupported with Feet Together  Able to place feet together independently and stand 1 minute safely    From Standing, Reach Forward with Outstretched Arm  Can reach forward >5 cm safely (2")    From Standing Position, Pick up Object from Floor  Able to pick up shoe safely and easily    From Standing Position, Turn to Look Behind Over each Shoulder  Looks behind one side only/other side shows less weight shift    Turn 360 Degrees  Able to turn 360 degrees safely but slowly    Standing Unsupported, Alternately Place Feet on Step/Stool  Able to complete 4 steps without aid or supervision    Standing Unsupported, One Foot in Front  Able to plae foot ahead of the other independently and hold 30 seconds    Standing on One Leg  Tries to lift leg/unable to hold 3 seconds but remains standing independently    Total Score  42        Nustep Lvl 2 RPM> 60 for cardiovascular challenge.    Goals: Deny falls: no falls past 4 weeks  BLE strength 5x STS: 16 seconds one near LOB BERG: 42/56  10 MWT=1.43 m/s w/o Cane  ABC= 80%    Treat:  airex pad: one foot on 7" step one foot on airex pad, hold 30 seconds each position x2 trials each LE position   airex pad: horizontal head turns 30 seconds, vertical head turns 30 seconds  airex pad: toe taps 7" step 20x SUE support    Tandem stance  heel toe rocking 2 mins each side, very challenging to patient with BUE support  Standing heel raises 15x BUE  support  RTB hip extension 15x each LE BUE support   Patient's condition has the potential to improve in response to therapy. Maximum improvement is yet to be obtained. The anticipated improvement is attainable and reasonable in a generally predictable time.  Patient reports he has noted an improvement in his balance. Reports improved smoothness of movements and improved strength. Wants to continue to work on balance due to it not being exactly where he wants it at the moment.                  PT Education - 07/19/19 0932    Education provided  Yes    Education Details  goals, exercise technique,    Person(s) Educated  Patient    Methods  Explanation;Demonstration;Tactile cues;Verbal cues    Comprehension  Verbalized understanding;Returned demonstration;Verbal cues required;Tactile cues required       PT Short Term Goals - 07/19/19 0953      PT SHORT TERM GOAL #1   Title  Patient will be independent in home exercise program to improve strength/mobility for better functional independence with ADLs.    Baseline  10/16: HEP given 11/17: HEP compliant    Time  4    Period  Weeks    Status  Achieved    Target Date  07/15/19      PT SHORT TERM GOAL #2   Title  Patient will deny any falls over past 4 weeks to demonstrate improved safety awareness at home and work.    Baseline  10/16: one major fall 11/17: no falls    Time  4    Period  Weeks    Status  Achieved    Target Date  07/15/19        PT Long Term Goals - 07/19/19 0950      PT LONG TERM GOAL #1   Title  Patient will increase BLE gross strength to 4+/5 as to improve functional strength for independent gait, increased standing tolerance and increased ADL ability.    Baseline  10/16: R gross 4-/5 with add/ext 3+/5 L grossly 3+/5 with hip add/ext 2+/5 11/17: R 4/5 add/ext; -/5 L 4-/5 hip add/ext 3/5    Time  8    Period  Weeks    Status  Partially Met    Target Date  08/12/19      PT LONG TERM GOAL #2    Title  Patient (> 50 years old) will complete five times sit to stand test in < 15 seconds indicating an increased LE strength and improved balance.    Baseline  10/16: 17 seconds with heavy use of hands on knees 11/17: 16 seconds one near LOB    Time  8    Period  Weeks    Status  Partially Met    Target Date  08/12/19      PT LONG TERM GOAL #3   Title  Patient will increase Berg Balance score by > 6 points (43/56)  to demonstrate decreased fall risk during functional activities.    Baseline  10/16: 37/56 11/17: 42/56    Time  8    Period  Weeks    Status  New    Target Date  08/12/19      PT LONG TERM GOAL #4   Title  Patient will increase 10 meter walk test to >1.42ms  as to improve gait speed for better community ambulation and to reduce fall risk.    Baseline  10/16: 0.91 w/o AD 11/17: 1.43 m/s with no AD    Time  8    Period  Weeks    Status  Achieved      PT LONG TERM GOAL #5   Title  Patient will increase ABC scale score >80% to demonstrate better functional mobility and better confidence with ADLs.    Baseline  10/16; 71% 11/17: 80%    Time  8    Period  Weeks    Status  Partially Met    Target Date  08/12/19            Plan - 07/19/19 1015    Clinical Impression Statement  Patient is progressing towards functional goals and met his 10 MWT goal performing test in 1.43 m/s without an AD. His balance has improved with BERG score. Posterior LOB continues to be noted with standing and transfers at this time when patient is distracted or fatigued. Patient's condition has the potential to improve in response to therapy. Maximum improvement is yet to be obtained. The anticipated improvement is attainable and reasonable in a generally predictable time.The patient would benefit from continued skilled PT intervention to maximize mobility, safety, and functional abilities.    Personal Factors and Comorbidities  Age;Comorbidity 3+;Fitness;Past/Current Experience;Sex;Social  Background;Time since onset of injury/illness/exacerbation;Transportation    Comorbidities  AAA, arthritis, atherosclerotic heart disease, (s/p CABG), benign neoplasm of ascending/descending colon, COPD, CAD, dementia, dysrhythmia, frequent falls, GERD, hemorrhoid, HOH, HTN, PAF, prostate cancer.    Examination-Activity Limitations  Bathing;Bend;Carry;Dressing;Continence;Stairs;Squat;Reach Overhead;Locomotion Level;Lift;Stand;Transfers;Toileting    Examination-Participation Restrictions  Church;Cleaning;Community Activity;Driving;Interpersonal Relationship;Laundry;Volunteer;Shop;Personal Finances;Meal Prep;Yard Work    [employer] Potential  Fair    Clinical Impairments Affecting Rehab Potential  (+) good support system, high prior level of function (-) currently undergoing radiation therapy for prostate cancer, memory deficits     PT Frequency  2x / week    PT Duration  8 weeks    PT Treatment/Interventions  ADLs/Self Care Home Management;Aquatic Therapy;Cryotherapy;Moist Heat;Traction;Ultrasound;Therapeutic activities;Functional mobility training;Stair training;Gait training;DME Instruction;Therapeutic exercise;Balance training;Neuromuscular re-education;Patient/family education;Manual techniques;Passive range of motion;Energy conservation;Vestibular;Taping    PT Next Visit Plan  progress dynamic stability    PT Home Exercise Plan  see above    Consulted and Agree with Plan of Care  Patient       Patient will benefit from skilled therapeutic intervention in order to improve the following deficits and impairments:  Abnormal gait, Cardiopulmonary status limiting activity, Decreased activity tolerance, Decreased balance, Decreased knowledge of precautions, Decreased endurance, Decreased coordination, Decreased cognition, Decreased knowledge of use of DME, Decreased mobility, Difficulty walking, Decreased safety awareness, Decreased strength, Impaired flexibility, Impaired perceived functional ability,  Impaired sensation, Postural dysfunction, Improper body mechanics, Pain  Visit Diagnosis: Unsteadiness on feet  Muscle weakness (generalized)  Other abnormalities of gait and mobility     Problem List Patient Active Problem List   Diagnosis Date Noted  . Status post abdominal aortic aneurysm (AAA) repair 04/15/2019  . Candidiasis 02/22/2019  . Malignant neoplasm of descending colon ([place])   . Benign neoplasm of descending colon   . Benign neoplasm of transverse colon   . Do not intubate, cardiopulmonary resuscitation (CPR)-only code status 10/08/2018  . Prostate cancer ([place]) 02/25/2018  . Prostatic cyst 01/15/2018  . 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 ([place]) 09/15/2017  . Blood in stool   . Abnormal feces   . Stricture and stenosis of esophagus   .  Mild cognitive impairment 08/18/2017  . Senile purpura ([place]) 04/29/2017  . Anemia 04/29/2017  . Frequent falls 04/29/2017  . Acute respiratory failure ([place]) 12/17/2016  . Simple chronic bronchitis ([place]) 12/17/2016  . Benign localized hyperplasia of prostate with urinary obstruction 07/15/2016  . Elevated PSA 07/15/2016  . Incomplete emptying of bladder 07/15/2016  . Urge incontinence 07/15/2016  . Hypothyroidism 04/25/2016  . Macular degeneration 04/25/2016  . Erectile dysfunction due to arterial insufficiency   . Ischemic heart disease with chronotropic incompetence   . CN (constipation) 02/10/2015  . DD (diverticular disease) 02/10/2015  . Fatigue 02/10/2015  . Lichen planus 13/24/4010  . Restless leg 02/10/2015  . 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 B12 deficiency 02/10/2015  . COPD (chronic obstructive pulmonary disease) ([place]) 11/14/2014  . Post Inflammatory Lung Changes 11/14/2014  . PAF (paroxysmal atrial fibrillation) ([facility]) CHA2DS2-VASc = 4. AC = Eliquis   . Insomnia 12/09/2013  . OSA on CPAP   . Dyspnea on exertion - -essentially resolved with BB dose reduction & wgt loss 03/29/2013  . Overweight (BMI 25.0-29.9)  -- Wgt back up 01/17/2013  . Atherosclerotic heart disease of native coronary artery without angina pectoris - s/p CABG, occluded SVG-D1; patent LIMA-LAD, SVG-OM, SVG- RCA   . Essential hypertension   . Hyperlipidemia with target LDL less than 70   . Aorto-iliac disease ([place])   . BCC (basal cell carcinoma), eyelid 01/03/2013  . Allergic rhinitis 08/17/2009  . Adaptation reaction 05/28/2009  . Acid reflux 05/28/2009  . Arthritis, degenerative 05/28/2009  . 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 Carotid artery occlusion without infarction 06/01/2006  . PAD (peripheral artery disease) - bilateral common iliac stents 12/15/2005  . Aortoiliac occlusive disease ([place]) 11/30/2005  . Atherosclerotic heart disease of artery bypass graft 09/01/1997   Janna Arch, PT, DPT   07/19/2019, 10:16 AM  [place] MAIN [facility] SERVICES [address] [place], [place], [address] Phone: [phone]   Fax:  [phone]  Name: [name] MRN: [id number] Date of Birth: February 20, 1934

## 2019-07-22 ENCOUNTER — Other Ambulatory Visit: Payer: Self-pay

## 2019-07-22 ENCOUNTER — Ambulatory Visit: Payer: Medicare Other

## 2019-07-22 DIAGNOSIS — R2681 Unsteadiness on feet: Secondary | ICD-10-CM | POA: Diagnosis not present

## 2019-07-22 DIAGNOSIS — M6281 Muscle weakness (generalized): Secondary | ICD-10-CM

## 2019-07-22 DIAGNOSIS — R262 Difficulty in walking, not elsewhere classified: Secondary | ICD-10-CM | POA: Diagnosis not present

## 2019-07-22 DIAGNOSIS — R2689 Other abnormalities of gait and mobility: Secondary | ICD-10-CM

## 2019-07-22 NOTE — Therapy (Signed)
[place] MAIN [facility] SERVICES [address] [place], [place], [address] Phone: [phone]   Fax:  [phone]  Physical Therapy Treatment  Patient Details  Name: [name] MRN: [id number] Date of Birth: 1934/08/08 Referring Provider (PT): Eulogio Ditch   Encounter Date: 07/22/2019  PT End of Session - 07/22/19 0903    Visit Number  11    Number of Visits  16    Date for PT Re-Evaluation  08/12/19    Authorization Type  1/10 PN 11/17    PT Start Time  0900    PT Stop Time  0944    PT Time Calculation (min)  44 min    Equipment Utilized During Treatment  Gait belt    Activity Tolerance  Patient tolerated treatment well    Behavior During Therapy  [facility] for tasks assessed/performed       Past Medical History:  Diagnosis Date  . AAA (abdominal aortic aneurysm) ([place])   . Arthritis   . Atherosclerotic heart disease of native coronary artery without angina pectoris 1998   - s/p CABG, occluded SVG-D1; patent LIMA-LAD, SVG-OM, SVG- RCA  . Basal cell carcinoma of eyelid 01/03/2013   2019 - R side of Nose  . Benign neoplasm of ascending colon   . Benign neoplasm of descending colon   . [facility])    Skin cancer  . Colon polyps   . COPD (chronic obstructive pulmonary disease) ([place])   . Coronary artery disease   . Dementia ([place])   . Dysrhythmia    atrial fib  . Falls frequently   . GERD (gastroesophageal reflux disease)   . Hemorrhoid 02/10/2015  . History of hiatal hernia   . History of kidney stones   . HOH (hard of hearing)   . Hypertension   . Memory loss   . Osteoporosis   . PAF (paroxysmal atrial fibrillation) ([place])   . Prostate cancer ([place])   . Prostate disorder   . Sciatic pain    Chronic  . Sleep apnea    cpap  . Temporary cerebral vascular dysfunction 02/10/2015   Had negative work-up.  July, 2012.  No medication changes, had forgot them that day, got dehydrated.   . Urinary incontinence     Past Surgical  History:  Procedure Laterality Date  . ABDOMINAL AORTIC ENDOVASCULAR STENT GRAFT Bilateral 05/18/2019   Procedure: ABDOMINAL AORTIC ENDOVASCULAR STENT GRAFT;  Surgeon: Algernon Huxley, MD;  Location: [facility] ORS;  Service: Vascular;  Laterality: Bilateral;  . ANKLE SURGERY    . Arch aortogram and carotid aortogram  06/02/2006   Dr Oneida Alar did surgery  . BASAL CELL CARCINOMA EXCISION  04/2016   Dermatology  . CARDIAC CATHETERIZATION  01/30/1998    (Dr. Linard Millers): Native LAD & mRCA CTO. 90% D1. Mod Cx. SVG-D1 CTO. SVG-RCA & SVG-OM along with LIMA-LAD patent;  LV NORMAL Fxn  . CAROTID ENDARTERECTOMY Left Oct. 29, 2007   Dr. Oneida Alar:  . CATARACT EXTRACTION W/PHACO Right 03/05/2017   Procedure: CATARACT EXTRACTION PHACO AND INTRAOCULAR LENS PLACEMENT (IOC);  Surgeon: Leandrew Koyanagi, MD;  Location: [facility] ORS;  Service: Ophthalmology;  Laterality: Right;  [place] 00:58.5 AP% 10.6 CDE 6.20 Fluid Pack lot # [id number]  . CATARACT EXTRACTION W/PHACO Left 04/15/2017   Procedure: CATARACT EXTRACTION PHACO AND INTRAOCULAR LENS PLACEMENT ([place])  Left;  Surgeon: Leandrew Koyanagi, MD;  Location: [place];  Service: Ophthalmology;  Laterality: Left;  . COLONOSCOPY WITH PROPOFOL N/A 09/15/2017  Procedure: COLONOSCOPY WITH PROPOFOL;  Surgeon: Lucilla Lame, MD;  Location: [facility] ENDOSCOPY;  Service: Endoscopy;  Laterality: N/A;  . COLONOSCOPY WITH PROPOFOL N/A 10/26/2018   Procedure: COLONOSCOPY WITH PROPOFOL;  Surgeon: Lucilla Lame, MD;  Location: [facility] ENDOSCOPY;  Service: Endoscopy;  Laterality: N/A;  . CORONARY ARTERY BYPASS GRAFT  1998   LIMA-LAD, SVG-OM, SVG-RPDA, SVG-DIAG  . CPET / MET  12/2012   Mild chronotropic incompetence - read 82% of predicted; also reduced effort; peak VO2 15.7 / 75% (did not reach Max effort) -- suggested ischemic response in last 1.5 minutes of exercise. Normal pulmonary function on PFTs but poor response to  . DOPPLER ECHOCARDIOGRAPHY  11/20/2010   EF =>55%; LV norm mild aortic  scelorosis  . ENDOVASCULAR REPAIR/STENT GRAFT N/A 05/18/2019   Procedure: ENDOVASCULAR REPAIR/STENT GRAFT;  Surgeon: Algernon Huxley, MD;  Location: [place] CV LAB;  Service: Cardiovascular;  Laterality: N/A;  . ESOPHAGOGASTRODUODENOSCOPY (EGD) WITH PROPOFOL N/A 09/15/2017   Procedure: ESOPHAGOGASTRODUODENOSCOPY (EGD) WITH PROPOFOL;  Surgeon: Lucilla Lame, MD;  Location: [facility] ENDOSCOPY;  Service: Endoscopy;  Laterality: N/A;  . FRACTURE SURGERY Right 03/19/2015   wrist  . HIATAL HERNIA REPAIR    . ILIAC ARTERY STENT  12/15/2005   PTA and direct stenting rgt and lft common iliac arteries  . KYPHOPLASTY N/A 04/05/2019   Procedure: KYPHOPLASTY T12, L3, L4 L5;  Surgeon: Hessie Knows, MD;  Location: [facility] ORS;  Service: Orthopedics;  Laterality: N/A;  . NM GATED MYOVIEW ([place] [facility])  12/2017   Low Risk - no ischemia or infarct.  EF > 65%. no RWMA  . NM MYOVIEW ([place] [facility])  11/11/2010; February 2016   a) TM STRESS----NORMAL PERFUSION ,EF 68%; b) EF 50-55%. Normal LV function. No significant ischemia or infarction.  . OPEN REDUCTION INTERNAL FIXATION (ORIF) DISTAL RADIAL FRACTURE Left 01/13/2019   Procedure: OPEN REDUCTION INTERNAL FIXATION (ORIF) DISTAL  RADIAL FRACTURE - LEFT - SLEEP APNEA;  Surgeon: Hessie Knows, MD;  Location: [facility] ORS;  Service: Orthopedics;  Laterality: Left;  . ORIF ELBOW FRACTURE Right 01/01/2017   Procedure: OPEN REDUCTION INTERNAL FIXATION (ORIF) ELBOW/OLECRANON FRACTURE;  Surgeon: Hessie Knows, MD;  Location: [facility] ORS;  Service: Orthopedics;  Laterality: Right;  . ORIF WRIST FRACTURE Right 03/19/2015   Procedure: OPEN REDUCTION INTERNAL FIXATION (ORIF) WRIST FRACTURE;  Surgeon: Hessie Knows, MD;  Location: [facility] ORS;  Service: Orthopedics;  Laterality: Right;  . THORACIC AORTA - CAROTID ANGIOGRAM  October 2007   Dr. 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 TRANSTHORACIC ECHOCARDIOGRAM  February  2016   [facility]: Normal LV function. Dilated left atrium.    There were no vitals filed for this visit.  Subjective Assessment - 07/22/19 0902    Subjective  Patient reports no pain this morning, no stumbles or falls since seen last night.    Pertinent History  Patient referred to physical therapy for balance/strength issues s/p abdominal aortic aneurysm (AAA) without rupture repair on 05/18/19 .  Patient has been seen by this therapist in the past (last seen in march of 2020) Patient had a fall on 01/08/19, MRI from [facility] dated 03/28/19 report acute compression fractures at T12, L3 and L4 as well as facet arthropathy at the lower lumbar levels and at L3-4 moderate central stenosis. Patient underwent injection on 10/6 at L4-5 and L5-S1. PMH includes AAA, arthritis, atherosclerotic heart disease, (s/p CABG), benign neoplasm of ascending/descending colon, COPD, CAD, dementia, dysrhythmia, frequent  falls, GERD, hemorrhoid, HOH, HTN, PAF, prostate cancer. Monday through Friday have 3 people come in different times from 730 to 12pm. Uses a walker occasionally now    Limitations  Walking;Standing;House hold activities;Other (comment);Lifting    How long can you sit comfortably?  20 minutes before back is painful    How long can you stand comfortably?  hour    How long can you walk comfortably?  using a walker about 30 minutes    Patient Stated Goals  patient would like to improve his balance and strenght.    Currently in Pain?  No/denies            Nustep Lvl 4 RPM> 60 for cardiovascular challenge 4 minutes    Neuromuscular Re-education: verbal cueing for task orientation and body mechanics for stability.     in //:  airex pad: tandem stance modified reaching outside BOS to grab ball toss into basketball hoop x 5 minutes each LE back  airex pad: no UE support; horizontal head turns 30 seconds x 2, vertical head turns 30 seconds x2, one near LOB posterior  airex pad: no UE support eyes closed 2x 30  seconds    airex pad one foot on airex pad one foot on 6" step static hold 60 seconds x 2 trials each LE    Balloon taps reaching inside and outside BOS with reaction timing x 3 minutes   Bosu ball modified lunges 12x each LE, SUE support mod cueing for upright posture  Tilt board anterior posterior 15x each LE position BUE support cueing for positioning   Therex:  Side stepping with RTB around ankles 4x length of // bars BUE support     6" step ups 10x each LE ; occasional use of hand for stabilization, very challenging to patient.    Standing df/pf 20x   Vitals monitored throughout session during rest periods to ensure O2 saturation return to functional range prior to initiation of each exercise.      Pt educated throughout session about proper posture and technique with exercises. Improved exercise technique, movement at target joints, use of target muscles after min to mod verbal, visual, tactile cues.                    PT Education - 07/22/19 0902    Education provided  Yes    Education Details  exercise technique, body mechanics    Person(s) Educated  Patient    Methods  Explanation;Demonstration;Tactile cues;Verbal cues    Comprehension  Verbalized understanding;Returned demonstration;Verbal cues required;Tactile cues required       PT Short Term Goals - 07/19/19 0953      PT SHORT TERM GOAL #1   Title  Patient will be independent in home exercise program to improve strength/mobility for better functional independence with ADLs.    Baseline  10/16: HEP given 11/17: HEP compliant    Time  4    Period  Weeks    Status  Achieved    Target Date  07/15/19      PT SHORT TERM GOAL #2   Title  Patient will deny any falls over past 4 weeks to demonstrate improved safety awareness at home and work.    Baseline  10/16: one major fall 11/17: no falls    Time  4    Period  Weeks    Status  Achieved    Target Date  07/15/19        PT Long  Term Goals -  07/19/19 0950      PT LONG TERM GOAL #1   Title  Patient will increase BLE gross strength to 4+/5 as to improve functional strength for independent gait, increased standing tolerance and increased ADL ability.    Baseline  10/16: R gross 4-/5 with add/ext 3+/5 L grossly 3+/5 with hip add/ext 2+/5 11/17: R 4/5 add/ext; -/5 L 4-/5 hip add/ext 3/5    Time  8    Period  Weeks    Status  Partially Met    Target Date  08/12/19      PT LONG TERM GOAL #2   Title  Patient (> 74 years old) will complete five times sit to stand test in < 15 seconds indicating an increased LE strength and improved balance.    Baseline  10/16: 17 seconds with heavy use of hands on knees 11/17: 16 seconds one near LOB    Time  8    Period  Weeks    Status  Partially Met    Target Date  08/12/19      PT LONG TERM GOAL #3   Title  Patient will increase Berg Balance score by > 6 points (43/56)  to demonstrate decreased fall risk during functional activities.    Baseline  10/16: 37/56 11/17: 42/56    Time  8    Period  Weeks    Status  New    Target Date  08/12/19      PT LONG TERM GOAL #4   Title  Patient will increase 10 meter walk test to >1.45ms as to improve gait speed for better community ambulation and to reduce fall risk.    Baseline  10/16: 0.91 w/o AD 11/17: 1.43 m/s with no AD    Time  8    Period  Weeks    Status  Achieved      PT LONG TERM GOAL #5   Title  Patient will increase ABC scale score >80% to demonstrate better functional mobility and better confidence with ADLs.    Baseline  10/16; 71% 11/17: 80%    Time  8    Period  Weeks    Status  Partially Met    Target Date  08/12/19            Plan - 07/22/19 05852   Clinical Impression Statement  Patient presents with excellent motivation throughout physical therapy session. Focus on stability with mobility performed with patient requiring occasional SUE support on unstable surfaces. Patient fatigues with prolonged standing stability  interventions requiring rest breaks. The patient would benefit from continued skilled PT intervention to maximize mobility, safety, and functional abilities.    Personal Factors and Comorbidities  Age;Comorbidity 3+;Fitness;Past/Current Experience;Sex;Social Background;Time since onset of injury/illness/exacerbation;Transportation    Comorbidities  AAA, arthritis, atherosclerotic heart disease, (s/p CABG), benign neoplasm of ascending/descending colon, COPD, CAD, dementia, dysrhythmia, frequent falls, GERD, hemorrhoid, HOH, HTN, PAF, prostate cancer.    Examination-Activity Limitations  Bathing;Bend;Carry;Dressing;Continence;Stairs;Squat;Reach Overhead;Locomotion Level;Lift;Stand;Transfers;Toileting    Examination-Participation Restrictions  Church;Cleaning;Community Activity;Driving;Interpersonal Relationship;Laundry;Volunteer;Shop;Personal Finances;Meal Prep;Yard Work    RPublixPotential  Fair    Clinical Impairments Affecting Rehab Potential  (+) good support system, high prior level of function (-) currently undergoing radiation therapy for prostate cancer, memory deficits     PT Frequency  2x / week    PT Duration  8 weeks    PT Treatment/Interventions  ADLs/Self Care Home Management;Aquatic Therapy;Cryotherapy;Moist Heat;Traction;Ultrasound;Therapeutic activities;Functional mobility training;Stair training;Gait training;DME Instruction;Therapeutic exercise;Balance  training;Neuromuscular re-education;Patient/family education;Manual techniques;Passive range of motion;Energy conservation;Vestibular;Taping    PT Next Visit Plan  progress dynamic stability    PT Home Exercise Plan  see above    Consulted and Agree with Plan of Care  Patient       Patient will benefit from skilled therapeutic intervention in order to improve the following deficits and impairments:  Abnormal gait, Cardiopulmonary status limiting activity, Decreased activity tolerance, Decreased balance, Decreased knowledge of  precautions, Decreased endurance, Decreased coordination, Decreased cognition, Decreased knowledge of use of DME, Decreased mobility, Difficulty walking, Decreased safety awareness, Decreased strength, Impaired flexibility, Impaired perceived functional ability, Impaired sensation, Postural dysfunction, Improper body mechanics, Pain  Visit Diagnosis: Unsteadiness on feet  Muscle weakness (generalized)  Other abnormalities of gait and mobility     Problem List Patient Active Problem List   Diagnosis Date Noted  . Status post abdominal aortic aneurysm (AAA) repair 04/15/2019  . Candidiasis 02/22/2019  . Malignant neoplasm of descending colon ([place])   . Benign neoplasm of descending colon   . Benign neoplasm of transverse colon   . Do not intubate, cardiopulmonary resuscitation (CPR)-only code status 10/08/2018  . Prostate cancer ([place]) 02/25/2018  . Prostatic cyst 01/15/2018  . 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 ([place]) 09/15/2017  . Blood in stool   . Abnormal feces   . Stricture and stenosis of esophagus   . Mild cognitive impairment 08/18/2017  . Senile purpura ([place]) 04/29/2017  . Anemia 04/29/2017  . Frequent falls 04/29/2017  . Acute respiratory failure ([place]) 12/17/2016  . Simple chronic bronchitis ([place]) 12/17/2016  . Benign localized hyperplasia of prostate with urinary obstruction 07/15/2016  . Elevated PSA 07/15/2016  . Incomplete emptying of bladder 07/15/2016  . Urge incontinence 07/15/2016  . Hypothyroidism 04/25/2016  . Macular degeneration 04/25/2016  . Erectile dysfunction due to arterial insufficiency   . Ischemic heart disease with chronotropic incompetence   . CN (constipation) 02/10/2015  . DD (diverticular disease) 02/10/2015  . Fatigue 02/10/2015  . Lichen planus 88/41/6606  . Restless leg 02/10/2015  . 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 B12 deficiency 02/10/2015  . COPD (chronic obstructive pulmonary disease) ([place]) 11/14/2014  . Post Inflammatory Lung  Changes 11/14/2014  . PAF (paroxysmal atrial fibrillation) ([facility]) CHA2DS2-VASc = 4. AC = Eliquis   . Insomnia 12/09/2013  . OSA on CPAP   . Dyspnea on exertion - -essentially resolved with BB dose reduction & wgt loss 03/29/2013  . Overweight (BMI 25.0-29.9) -- Wgt back up 01/17/2013  . Atherosclerotic heart disease of native coronary artery without angina pectoris - s/p CABG, occluded SVG-D1; patent LIMA-LAD, SVG-OM, SVG- RCA   . Essential hypertension   . Hyperlipidemia with target LDL less than 70   . Aorto-iliac disease ([place])   . BCC (basal cell carcinoma), eyelid 01/03/2013  . Allergic rhinitis 08/17/2009  . Adaptation reaction 05/28/2009  . Acid reflux 05/28/2009  . Arthritis, degenerative 05/28/2009  . 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 Carotid artery occlusion without infarction 06/01/2006  . PAD (peripheral artery disease) - bilateral common iliac stents 12/15/2005  . Aortoiliac occlusive disease ([place]) 11/30/2005  . Atherosclerotic heart disease of artery bypass graft 09/01/1997   Janna Arch, PT, DPT   07/22/2019, 10:04 AM  [place] MAIN [facility] SERVICES [address] [place], [place], [address] Phone: [phone]   Fax:  [phone]  Name: JACORIE ERNSBERGER MRN: [id number] Date of Birth: 1933/11/19

## 2019-07-27 DIAGNOSIS — H0100A Unspecified blepharitis right eye, upper and lower eyelids: Secondary | ICD-10-CM | POA: Diagnosis not present

## 2019-08-01 DIAGNOSIS — H0100A Unspecified blepharitis right eye, upper and lower eyelids: Secondary | ICD-10-CM | POA: Diagnosis not present

## 2019-08-02 ENCOUNTER — Other Ambulatory Visit: Payer: Self-pay

## 2019-08-02 ENCOUNTER — Ambulatory Visit: Payer: Medicare Other | Attending: Nurse Practitioner

## 2019-08-02 DIAGNOSIS — R262 Difficulty in walking, not elsewhere classified: Secondary | ICD-10-CM | POA: Diagnosis present

## 2019-08-02 DIAGNOSIS — M6281 Muscle weakness (generalized): Secondary | ICD-10-CM

## 2019-08-02 DIAGNOSIS — R2681 Unsteadiness on feet: Secondary | ICD-10-CM | POA: Diagnosis not present

## 2019-08-02 DIAGNOSIS — R2689 Other abnormalities of gait and mobility: Secondary | ICD-10-CM | POA: Insufficient documentation

## 2019-08-02 NOTE — Therapy (Signed)
[place] MAIN [facility] SERVICES [address] [place], [place], [address] Phone: [phone]   Fax:  [phone]  Physical Therapy Treatment  Patient Details  Name: [name] MRN: [id number] Date of Birth: 1934/04/12 Referring Provider (PT): Eulogio Ditch   Encounter Date: 08/02/2019  PT End of Session - 08/02/19 0934    Visit Number  12    Number of Visits  16    Date for PT Re-Evaluation  08/12/19    Authorization Type  2/10 PN 11/17    PT Start Time  0929    PT Stop Time  1014    PT Time Calculation (min)  45 min    Equipment Utilized During Treatment  Gait belt    Activity Tolerance  Patient tolerated treatment well    Behavior During Therapy  [facility] for tasks assessed/performed       Past Medical History:  Diagnosis Date  . AAA (abdominal aortic aneurysm) ([place])   . Arthritis   . Atherosclerotic heart disease of native coronary artery without angina pectoris 1998   - s/p CABG, occluded SVG-D1; patent LIMA-LAD, SVG-OM, SVG- RCA  . Basal cell carcinoma of eyelid 01/03/2013   2019 - R side of Nose  . Benign neoplasm of ascending colon   . Benign neoplasm of descending colon   . Cancer [facility])    Skin cancer  . Colon polyps   . COPD (chronic obstructive pulmonary disease) ([place])   . Coronary artery disease   . Dementia ([place])   . Dysrhythmia    atrial fib  . Falls frequently   . GERD (gastroesophageal reflux disease)   . Hemorrhoid 02/10/2015  . History of hiatal hernia   . History of kidney stones   . HOH (hard of hearing)   . Hypertension   . Memory loss   . Osteoporosis   . PAF (paroxysmal atrial fibrillation) ([place])   . Prostate cancer ([place])   . Prostate disorder   . Sciatic pain    Chronic  . Sleep apnea    cpap  . Temporary cerebral vascular dysfunction 02/10/2015   Had negative work-up.  July, 2012.  No medication changes, had forgot them that day, got dehydrated.   . Urinary incontinence     Past Surgical  History:  Procedure Laterality Date  . ABDOMINAL AORTIC ENDOVASCULAR STENT GRAFT Bilateral 05/18/2019   Procedure: ABDOMINAL AORTIC ENDOVASCULAR STENT GRAFT;  Surgeon: Algernon Huxley, MD;  Location: [facility] ORS;  Service: Vascular;  Laterality: Bilateral;  . ANKLE SURGERY    . Arch aortogram and carotid aortogram  06/02/2006   Dr Oneida Alar did surgery  . BASAL CELL CARCINOMA EXCISION  04/2016   Dermatology  . CARDIAC CATHETERIZATION  01/30/1998    (Dr. Linard Millers): Native LAD & mRCA CTO. 90% D1. Mod Cx. SVG-D1 CTO. SVG-RCA & SVG-OM along with LIMA-LAD patent;  LV NORMAL Fxn  . CAROTID ENDARTERECTOMY Left Oct. 29, 2007   Dr. Oneida Alar:  . CATARACT EXTRACTION W/PHACO Right 03/05/2017   Procedure: CATARACT EXTRACTION PHACO AND INTRAOCULAR LENS PLACEMENT (IOC);  Surgeon: Leandrew Koyanagi, MD;  Location: [facility] ORS;  Service: Ophthalmology;  Laterality: Right;  [place] 00:58.5 AP% 10.6 CDE 6.20 Fluid Pack lot # [id number]  . CATARACT EXTRACTION W/PHACO Left 04/15/2017   Procedure: CATARACT EXTRACTION PHACO AND INTRAOCULAR LENS PLACEMENT ([place])  Left;  Surgeon: Leandrew Koyanagi, MD;  Location: [place];  Service: Ophthalmology;  Laterality: Left;  . COLONOSCOPY WITH PROPOFOL N/A 09/15/2017  Procedure: COLONOSCOPY WITH PROPOFOL;  Surgeon: Lucilla Lame, MD;  Location: [facility] ENDOSCOPY;  Service: Endoscopy;  Laterality: N/A;  . COLONOSCOPY WITH PROPOFOL N/A 10/26/2018   Procedure: COLONOSCOPY WITH PROPOFOL;  Surgeon: Lucilla Lame, MD;  Location: [facility] ENDOSCOPY;  Service: Endoscopy;  Laterality: N/A;  . CORONARY ARTERY BYPASS GRAFT  1998   LIMA-LAD, SVG-OM, SVG-RPDA, SVG-DIAG  . CPET / MET  12/2012   Mild chronotropic incompetence - read 82% of predicted; also reduced effort; peak VO2 15.7 / 75% (did not reach Max effort) -- suggested ischemic response in last 1.5 minutes of exercise. Normal pulmonary function on PFTs but poor response to  . DOPPLER ECHOCARDIOGRAPHY  11/20/2010   EF =>55%; LV norm mild aortic  scelorosis  . ENDOVASCULAR REPAIR/STENT GRAFT N/A 05/18/2019   Procedure: ENDOVASCULAR REPAIR/STENT GRAFT;  Surgeon: Algernon Huxley, MD;  Location: [facility] CV LAB;  Service: Cardiovascular;  Laterality: N/A;  . ESOPHAGOGASTRODUODENOSCOPY (EGD) WITH PROPOFOL N/A 09/15/2017   Procedure: ESOPHAGOGASTRODUODENOSCOPY (EGD) WITH PROPOFOL;  Surgeon: Lucilla Lame, MD;  Location: [facility] ENDOSCOPY;  Service: Endoscopy;  Laterality: N/A;  . FRACTURE SURGERY Right 03/19/2015   wrist  . HIATAL HERNIA REPAIR    . ILIAC ARTERY STENT  12/15/2005   PTA and direct stenting rgt and lft common iliac arteries  . KYPHOPLASTY N/A 04/05/2019   Procedure: KYPHOPLASTY T12, L3, L4 L5;  Surgeon: Hessie Knows, MD;  Location: [facility] ORS;  Service: Orthopedics;  Laterality: N/A;  . NM GATED MYOVIEW ([place] [facility])  12/2017   Low Risk - no ischemia or infarct.  EF > 65%. no RWMA  . NM MYOVIEW ([place] [facility])  11/11/2010; February 2016   a) TM STRESS----NORMAL PERFUSION ,EF 68%; b) EF 50-55%. Normal LV function. No significant ischemia or infarction.  . OPEN REDUCTION INTERNAL FIXATION (ORIF) DISTAL RADIAL FRACTURE Left 01/13/2019   Procedure: OPEN REDUCTION INTERNAL FIXATION (ORIF) DISTAL  RADIAL FRACTURE - LEFT - SLEEP APNEA;  Surgeon: Hessie Knows, MD;  Location: [facility] ORS;  Service: Orthopedics;  Laterality: Left;  . ORIF ELBOW FRACTURE Right 01/01/2017   Procedure: OPEN REDUCTION INTERNAL FIXATION (ORIF) ELBOW/OLECRANON FRACTURE;  Surgeon: Hessie Knows, MD;  Location: [facility] ORS;  Service: Orthopedics;  Laterality: Right;  . ORIF WRIST FRACTURE Right 03/19/2015   Procedure: OPEN REDUCTION INTERNAL FIXATION (ORIF) WRIST FRACTURE;  Surgeon: Hessie Knows, MD;  Location: [facility] ORS;  Service: Orthopedics;  Laterality: Right;  . THORACIC AORTA - CAROTID ANGIOGRAM  October 2007   Dr. 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 TRANSTHORACIC ECHOCARDIOGRAM  February  2016   [facility]: Normal LV function. Dilated left atrium.    There were no vitals filed for this visit.  Subjective Assessment - 08/02/19 0932    Subjective  Patient reports his eyes have been bothering him, went to the doctor twice for it. No falls or LOB since last session.    Pertinent History  Patient referred to physical therapy for balance/strength issues s/p abdominal aortic aneurysm (AAA) without rupture repair on 05/18/19 .  Patient has been seen by this therapist in the past (last seen in march of 2020) Patient had a fall on 01/08/19, MRI from [facility] dated 03/28/19 report acute compression fractures at T12, L3 and L4 as well as facet arthropathy at the lower lumbar levels and at L3-4 moderate central stenosis. Patient underwent injection on 10/6 at L4-5 and L5-S1. PMH includes AAA, arthritis, atherosclerotic heart disease, (s/p CABG), benign neoplasm  of ascending/descending colon, COPD, CAD, dementia, dysrhythmia, frequent falls, GERD, hemorrhoid, HOH, HTN, PAF, prostate cancer. Monday through Friday have 3 people come in different times from 730 to 12pm. Uses a walker occasionally now    Limitations  Walking;Standing;House hold activities;Other (comment);Lifting    How long can you sit comfortably?  20 minutes before back is painful    How long can you stand comfortably?  hour    How long can you walk comfortably?  using a walker about 30 minutes    Patient Stated Goals  patient would like to improve his balance and strenght.    Currently in Pain?  No/denies            Nustep Lvl 4 RPM> 60 for cardiovascular challenge 4 minutes    Neuromuscular Re-education: verbal cueing for task orientation and body mechanics for stability.     in //:    airex pad: no UE support; horizontal head turns 30 seconds x 2, vertical head turns 30 seconds x2, one near LOB posterior   airex pad: no UE support eyes closed 2x 30 seconds    airex pad one foot on airex pad one foot on 6" step static hold 60  seconds x 2 trials each LE    Balloon taps reaching inside and outside BOS with reaction timing x 3 minutes   Ambulate in hallway with horizontal head turns and CGA, occasional posterior near LOB    Cross body opposite arm leg raises 10x, one side at a time consecutively, then 10x alternating  challenging to patient,   Therex:    Standing df/pf 20x  Quantum leg press: bilateral legs: #135 first set 15x, #140 second 15x  set cueing for decreased velocity of movement for optimal muscle recruitment  Quantum leg press: single leg : #75 15x each LE, 2 sets cueing for decreased velocity for movement with optimal muscle recruitment  Seated adduction ball squeezes 15x 3 second holds  10 STS from standard height chair.     Vitals monitored throughout session during rest periods to ensure O2 saturation return to functional range prior to initiation of each exercise.      Pt educated throughout session about proper posture and technique with exercises. Improved exercise technique, movement at target joints, use of target muscles after min to mod verbal, visual, tactile cues.     Patient presents with excellent motivation throughout physical therapy session. Continues to be challenged with unstable surfaces with limited ankle righting reactions. Patient is progressing with functional strength with increased resistance tolerated. The patient would benefit from continued skilled PT intervention to maximize mobility, safety, and functional abilities.                  PT Education - 08/02/19 0933    Education provided  Yes    Education Details  exercise technique, body mechanics    Person(s) Educated  Patient    Methods  Explanation;Demonstration;Tactile cues;Verbal cues    Comprehension  Verbalized understanding;Returned demonstration;Verbal cues required;Tactile cues required       PT Short Term Goals - 07/19/19 0953      PT SHORT TERM GOAL #1   Title  Patient will be  independent in home exercise program to improve strength/mobility for better functional independence with ADLs.    Baseline  10/16: HEP given 11/17: HEP compliant    Time  4    Period  Weeks    Status  Achieved    Target Date  07/15/19  PT SHORT TERM GOAL #2   Title  Patient will deny any falls over past 4 weeks to demonstrate improved safety awareness at home and work.    Baseline  10/16: one major fall 11/17: no falls    Time  4    Period  Weeks    Status  Achieved    Target Date  07/15/19        PT Long Term Goals - 07/19/19 0950      PT LONG TERM GOAL #1   Title  Patient will increase BLE gross strength to 4+/5 as to improve functional strength for independent gait, increased standing tolerance and increased ADL ability.    Baseline  10/16: R gross 4-/5 with add/ext 3+/5 L grossly 3+/5 with hip add/ext 2+/5 11/17: R 4/5 add/ext; -/5 L 4-/5 hip add/ext 3/5    Time  8    Period  Weeks    Status  Partially Met    Target Date  08/12/19      PT LONG TERM GOAL #2   Title  Patient (> 108 years old) will complete five times sit to stand test in < 15 seconds indicating an increased LE strength and improved balance.    Baseline  10/16: 17 seconds with heavy use of hands on knees 11/17: 16 seconds one near LOB    Time  8    Period  Weeks    Status  Partially Met    Target Date  08/12/19      PT LONG TERM GOAL #3   Title  Patient will increase Berg Balance score by > 6 points (43/56)  to demonstrate decreased fall risk during functional activities.    Baseline  10/16: 37/56 11/17: 42/56    Time  8    Period  Weeks    Status  New    Target Date  08/12/19      PT LONG TERM GOAL #4   Title  Patient will increase 10 meter walk test to >1.70ms as to improve gait speed for better community ambulation and to reduce fall risk.    Baseline  10/16: 0.91 w/o AD 11/17: 1.43 m/s with no AD    Time  8    Period  Weeks    Status  Achieved      PT LONG TERM GOAL #5   Title  Patient  will increase ABC scale score >80% to demonstrate better functional mobility and better confidence with ADLs.    Baseline  10/16; 71% 11/17: 80%    Time  8    Period  Weeks    Status  Partially Met    Target Date  08/12/19            Plan - 08/02/19 01610   Clinical Impression Statement  Patient presents with excellent motivation throughout physical therapy session. Continues to be challenged with unstable surfaces with limited ankle righting reactions. Patient is progressing with functional strength with increased resistance tolerated. The patient would benefit from continued skilled PT intervention to maximize mobility, safety, and functional abilities.    Personal Factors and Comorbidities  Age;Comorbidity 3+;Fitness;Past/Current Experience;Sex;Social Background;Time since onset of injury/illness/exacerbation;Transportation    Comorbidities  AAA, arthritis, atherosclerotic heart disease, (s/p CABG), benign neoplasm of ascending/descending colon, COPD, CAD, dementia, dysrhythmia, frequent falls, GERD, hemorrhoid, HOH, HTN, PAF, prostate cancer.    Examination-Activity Limitations  Bathing;Bend;Carry;Dressing;Continence;Stairs;Squat;Reach Overhead;Locomotion Level;Lift;Stand;Transfers;Toileting    Examination-Participation Restrictions  Church;Cleaning;Community Activity;Driving;Interpersonal Relationship;Laundry;Volunteer;Shop;Personal Finances;Meal Prep;YValla LeaverWork  Rehab Potential  Fair    Clinical Impairments Affecting Rehab Potential  (+) good support system, high prior level of function (-) currently undergoing radiation therapy for prostate cancer, memory deficits     PT Frequency  2x / week    PT Duration  8 weeks    PT Treatment/Interventions  ADLs/Self Care Home Management;Aquatic Therapy;Cryotherapy;Moist Heat;Traction;Ultrasound;Therapeutic activities;Functional mobility training;Stair training;Gait training;DME Instruction;Therapeutic exercise;Balance training;Neuromuscular  re-education;Patient/family education;Manual techniques;Passive range of motion;Energy conservation;Vestibular;Taping    PT Next Visit Plan  progress dynamic stability    PT Home Exercise Plan  see above    Consulted and Agree with Plan of Care  Patient       Patient will benefit from skilled therapeutic intervention in order to improve the following deficits and impairments:  Abnormal gait, Cardiopulmonary status limiting activity, Decreased activity tolerance, Decreased balance, Decreased knowledge of precautions, Decreased endurance, Decreased coordination, Decreased cognition, Decreased knowledge of use of DME, Decreased mobility, Difficulty walking, Decreased safety awareness, Decreased strength, Impaired flexibility, Impaired perceived functional ability, Impaired sensation, Postural dysfunction, Improper body mechanics, Pain  Visit Diagnosis: Unsteadiness on feet  Muscle weakness (generalized)  Other abnormalities of gait and mobility     Problem List Patient Active Problem List   Diagnosis Date Noted  . Status post abdominal aortic aneurysm (AAA) repair 04/15/2019  . Candidiasis 02/22/2019  . Malignant neoplasm of descending colon ([place])   . Benign neoplasm of descending colon   . Benign neoplasm of transverse colon   . Do not intubate, cardiopulmonary resuscitation (CPR)-only code status 10/08/2018  . Prostate cancer ([place]) 02/25/2018  . Prostatic cyst 01/15/2018  . 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 ([place]) 09/15/2017  . Blood in stool   . Abnormal feces   . Stricture and stenosis of esophagus   . Mild cognitive impairment 08/18/2017  . Senile purpura ([place]) 04/29/2017  . Anemia 04/29/2017  . Frequent falls 04/29/2017  . Acute respiratory failure ([place]) 12/17/2016  . Simple chronic bronchitis ([place]) 12/17/2016  . Benign localized hyperplasia of prostate with urinary obstruction 07/15/2016  . Elevated PSA 07/15/2016  . Incomplete emptying of bladder 07/15/2016  . Urge  incontinence 07/15/2016  . Hypothyroidism 04/25/2016  . Macular degeneration 04/25/2016  . Erectile dysfunction due to arterial insufficiency   . Ischemic heart disease with chronotropic incompetence   . CN (constipation) 02/10/2015  . DD (diverticular disease) 02/10/2015  . Fatigue 02/10/2015  . Lichen planus 02/29/1600  . Restless leg 02/10/2015  . 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 B12 deficiency 02/10/2015  . COPD (chronic obstructive pulmonary disease) ([place]) 11/14/2014  . Post Inflammatory Lung Changes 11/14/2014  . PAF (paroxysmal atrial fibrillation) ([facility]) CHA2DS2-VASc = 4. AC = Eliquis   . Insomnia 12/09/2013  . OSA on CPAP   . Dyspnea on exertion - -essentially resolved with BB dose reduction & wgt loss 03/29/2013  . Overweight (BMI 25.0-29.9) -- Wgt back up 01/17/2013  . Atherosclerotic heart disease of native coronary artery without angina pectoris - s/p CABG, occluded SVG-D1; patent LIMA-LAD, SVG-OM, SVG- RCA   . Essential hypertension   . Hyperlipidemia with target LDL less than 70   . Aorto-iliac disease ([place])   . BCC (basal cell carcinoma), eyelid 01/03/2013  . Allergic rhinitis 08/17/2009  . Adaptation reaction 05/28/2009  . Acid reflux 05/28/2009  . Arthritis, degenerative 05/28/2009  . 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 Carotid artery occlusion without infarction 06/01/2006  . PAD (peripheral artery disease) - bilateral common iliac stents 12/15/2005  . Aortoiliac  occlusive disease ([place]) 11/30/2005  . Atherosclerotic heart disease of artery bypass graft 09/01/1997   Janna Arch, PT, DPT   08/02/2019, 10:17 AM  [place] MAIN [facility] SERVICES [address] [place], [place], [address] Phone: [phone]   Fax:  [phone]  Name: [name] MRN: [id number] Date of Birth: 1933/12/28

## 2019-08-05 ENCOUNTER — Other Ambulatory Visit: Payer: Self-pay

## 2019-08-05 ENCOUNTER — Ambulatory Visit: Payer: Medicare Other

## 2019-08-05 DIAGNOSIS — R262 Difficulty in walking, not elsewhere classified: Secondary | ICD-10-CM

## 2019-08-05 DIAGNOSIS — M6281 Muscle weakness (generalized): Secondary | ICD-10-CM

## 2019-08-05 DIAGNOSIS — R2681 Unsteadiness on feet: Secondary | ICD-10-CM

## 2019-08-05 DIAGNOSIS — R2689 Other abnormalities of gait and mobility: Secondary | ICD-10-CM

## 2019-08-05 NOTE — Therapy (Signed)
[place] MAIN [facility] SERVICES [address] [place], [place], [address] Phone: [phone]   Fax:  [phone]  Physical Therapy Treatment  Patient Details  Name: [name] MRN: [id number] Date of Birth: Sep 20, 1933 Referring Provider (PT): Eulogio Ditch   Encounter Date: 08/05/2019  PT End of Session - 08/05/19 0941    Visit Number  13    Number of Visits  16    Date for PT Re-Evaluation  08/12/19    PT Start Time  0937    PT Stop Time  1015    PT Time Calculation (min)  38 min    Equipment Utilized During Treatment  Gait belt    Activity Tolerance  Patient tolerated treatment well    Behavior During Therapy  [facility] for tasks assessed/performed       Past Medical History:  Diagnosis Date  . AAA (abdominal aortic aneurysm) ([place])   . Arthritis   . Atherosclerotic heart disease of native coronary artery without angina pectoris 1998   - s/p CABG, occluded SVG-D1; patent LIMA-LAD, SVG-OM, SVG- RCA  . Basal cell carcinoma of eyelid 01/03/2013   2019 - R side of Nose  . Benign neoplasm of ascending colon   . Benign neoplasm of descending colon   . Cancer [facility])    Skin cancer  . Colon polyps   . COPD (chronic obstructive pulmonary disease) ([place])   . Coronary artery disease   . Dementia ([place])   . Dysrhythmia    atrial fib  . Falls frequently   . GERD (gastroesophageal reflux disease)   . Hemorrhoid 02/10/2015  . History of hiatal hernia   . History of kidney stones   . HOH (hard of hearing)   . Hypertension   . Memory loss   . Osteoporosis   . PAF (paroxysmal atrial fibrillation) ([employer])   . Prostate cancer ([place])   . Prostate disorder   . Sciatic pain    Chronic  . Sleep apnea    cpap  . Temporary cerebral vascular dysfunction 02/10/2015   Had negative work-up.  July, 2012.  No medication changes, had forgot them that day, got dehydrated.   . Urinary incontinence     Past Surgical History:  Procedure Laterality Date  .  ABDOMINAL AORTIC ENDOVASCULAR STENT GRAFT Bilateral 05/18/2019   Procedure: ABDOMINAL AORTIC ENDOVASCULAR STENT GRAFT;  Surgeon: Algernon Huxley, MD;  Location: [facility] ORS;  Service: Vascular;  Laterality: Bilateral;  . ANKLE SURGERY    . Arch aortogram and carotid aortogram  06/02/2006   Dr Oneida Alar did surgery  . BASAL CELL CARCINOMA EXCISION  04/2016   Dermatology  . CARDIAC CATHETERIZATION  01/30/1998    (Dr. Linard Millers): Native LAD & mRCA CTO. 90% D1. Mod Cx. SVG-D1 CTO. SVG-RCA & SVG-OM along with LIMA-LAD patent;  LV NORMAL Fxn  . CAROTID ENDARTERECTOMY Left Oct. 29, 2007   Dr. Oneida Alar:  . CATARACT EXTRACTION W/PHACO Right 03/05/2017   Procedure: CATARACT EXTRACTION PHACO AND INTRAOCULAR LENS PLACEMENT (IOC);  Surgeon: Leandrew Koyanagi, MD;  Location: [facility] ORS;  Service: Ophthalmology;  Laterality: Right;  [place] 00:58.5 AP% 10.6 CDE 6.20 Fluid Pack lot # [id number]  . CATARACT EXTRACTION W/PHACO Left 04/15/2017   Procedure: CATARACT EXTRACTION PHACO AND INTRAOCULAR LENS PLACEMENT ([place])  Left;  Surgeon: Leandrew Koyanagi, MD;  Location: [place];  Service: Ophthalmology;  Laterality: Left;  . COLONOSCOPY WITH PROPOFOL N/A 09/15/2017   Procedure: COLONOSCOPY WITH PROPOFOL;  Surgeon: Lucilla Lame,  MD;  Location: [facility] ENDOSCOPY;  Service: Endoscopy;  Laterality: N/A;  . COLONOSCOPY WITH PROPOFOL N/A 10/26/2018   Procedure: COLONOSCOPY WITH PROPOFOL;  Surgeon: Lucilla Lame, MD;  Location: [facility] ENDOSCOPY;  Service: Endoscopy;  Laterality: N/A;  . CORONARY ARTERY BYPASS GRAFT  1998   LIMA-LAD, SVG-OM, SVG-RPDA, SVG-DIAG  . CPET / MET  12/2012   Mild chronotropic incompetence - read 82% of predicted; also reduced effort; peak VO2 15.7 / 75% (did not reach Max effort) -- suggested ischemic response in last 1.5 minutes of exercise. Normal pulmonary function on PFTs but poor response to  . DOPPLER ECHOCARDIOGRAPHY  11/20/2010   EF =>55%; LV norm mild aortic scelorosis  . ENDOVASCULAR REPAIR/STENT  GRAFT N/A 05/18/2019   Procedure: ENDOVASCULAR REPAIR/STENT GRAFT;  Surgeon: Algernon Huxley, MD;  Location: [place] CV LAB;  Service: Cardiovascular;  Laterality: N/A;  . ESOPHAGOGASTRODUODENOSCOPY (EGD) WITH PROPOFOL N/A 09/15/2017   Procedure: ESOPHAGOGASTRODUODENOSCOPY (EGD) WITH PROPOFOL;  Surgeon: Lucilla Lame, MD;  Location: [facility] ENDOSCOPY;  Service: Endoscopy;  Laterality: N/A;  . FRACTURE SURGERY Right 03/19/2015   wrist  . HIATAL HERNIA REPAIR    . ILIAC ARTERY STENT  12/15/2005   PTA and direct stenting rgt and lft common iliac arteries  . KYPHOPLASTY N/A 04/05/2019   Procedure: KYPHOPLASTY T12, L3, L4 L5;  Surgeon: Hessie Knows, MD;  Location: [facility] ORS;  Service: Orthopedics;  Laterality: N/A;  . NM GATED MYOVIEW ([place] [facility]X)  12/2017   Low Risk - no ischemia or infarct.  EF > 65%. no RWMA  . NM MYOVIEW ([place] HX)  11/11/2010; February 2016   a) TM STRESS----NORMAL PERFUSION ,EF 68%; b) EF 50-55%. Normal LV function. No significant ischemia or infarction.  . OPEN REDUCTION INTERNAL FIXATION (ORIF) DISTAL RADIAL FRACTURE Left 01/13/2019   Procedure: OPEN REDUCTION INTERNAL FIXATION (ORIF) DISTAL  RADIAL FRACTURE - LEFT - SLEEP APNEA;  Surgeon: Hessie Knows, MD;  Location: [facility] ORS;  Service: Orthopedics;  Laterality: Left;  . ORIF ELBOW FRACTURE Right 01/01/2017   Procedure: OPEN REDUCTION INTERNAL FIXATION (ORIF) ELBOW/OLECRANON FRACTURE;  Surgeon: Hessie Knows, MD;  Location: [facility] ORS;  Service: Orthopedics;  Laterality: Right;  . ORIF WRIST FRACTURE Right 03/19/2015   Procedure: OPEN REDUCTION INTERNAL FIXATION (ORIF) WRIST FRACTURE;  Surgeon: Hessie Knows, MD;  Location: [facility] ORS;  Service: Orthopedics;  Laterality: Right;  . THORACIC AORTA - CAROTID ANGIOGRAM  October 2007   Dr. 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 TRANSTHORACIC ECHOCARDIOGRAM  February 2016   [facility]: Normal LV function. Dilated  left atrium.    There were no vitals filed for this visit.  Subjective Assessment - 08/05/19 0941    Subjective  Pt doing fair to well this date, denies any new changes or update. Pt has had no falls sicne last visit.    Pertinent History  Patient referred to physical therapy for balance/strength issues s/p abdominal aortic aneurysm (AAA) without rupture repair on 05/18/19 .  Patient has been seen by this therapist in the past (last seen in march of 2020) Patient had a fall on 01/08/19, MRI from [facility] dated 03/28/19 report acute compression fractures at T12, L3 and L4 as well as facet arthropathy at the lower lumbar levels and at L3-4 moderate central stenosis. Patient underwent injection on 10/6 at L4-5 and L5-S1. PMH includes AAA, arthritis, atherosclerotic heart disease, (s/p CABG), benign neoplasm of ascending/descending colon, COPD, CAD, dementia, dysrhythmia, frequent falls,  GERD, hemorrhoid, HOH, HTN, PAF, prostate cancer. Monday through Friday have 3 people come in different times from 730 to 12pm. Uses a walker occasionally now    Currently in Pain?  No/denies      INTERVENTION THIS DATE:  Therex:  -Nus[employer] Seat 12, arms 11, 5 minutes SPM 60s, Level 2  -Quantum leg press: bilateral legs: #135 first set 15x, #140 second 15x, VC for full ROM  -Standing heel raises 2x20, UE supported for balance  -Seated Ankle DF 2.5lbAW bilat 1x20bilat  -Standing marching 2.5lb AW 1x10 bilat     Neuromuscular Re-education:   -airex pad self ball toss 1x30 (bimanual) (retro LOB) -airex ball slams/catch 1x20 -airex pad: no UE support eyes closed 3x 30 seconds (actually has more LOB with eyes open between bouts) -agility ladder pattern and cognitive challenge: latrral stepping 1x each way; fwd/latera/bwd/lateral 1x each way  -4" lateral step up 4x each side, pt asks to stop d/t fatigue.      PT Short Term Goals - 07/19/19 0953      PT SHORT TERM GOAL #1   Title  Patient will be independent in home  exercise program to improve strength/mobility for better functional independence with ADLs.    Baseline  10/16: HEP given 11/17: HEP compliant    Time  4    Period  Weeks    Status  Achieved    Target Date  07/15/19      PT SHORT TERM GOAL #2   Title  Patient will deny any falls over past 4 weeks to demonstrate improved safety awareness at home and work.    Baseline  10/16: one major fall 11/17: no falls    Time  4    Period  Weeks    Status  Achieved    Target Date  07/15/19        PT Long Term Goals - 07/19/19 0950      PT LONG TERM GOAL #1   Title  Patient will increase BLE gross strength to 4+/5 as to improve functional strength for independent gait, increased standing tolerance and increased ADL ability.    Baseline  10/16: R gross 4-/5 with add/ext 3+/5 L grossly 3+/5 with hip add/ext 2+/5 11/17: R 4/5 add/ext; -/5 L 4-/5 hip add/ext 3/5    Time  8    Period  Weeks    Status  Partially Met    Target Date  08/12/19      PT LONG TERM GOAL #2   Title  Patient (> 66 years old) will complete five times sit to stand test in < 15 seconds indicating an increased LE strength and improved balance.    Baseline  10/16: 17 seconds with heavy use of hands on knees 11/17: 16 seconds one near LOB    Time  8    Period  Weeks    Status  Partially Met    Target Date  08/12/19      PT LONG TERM GOAL #3   Title  Patient will increase Berg Balance score by > 6 points (43/56)  to demonstrate decreased fall risk during functional activities.    Baseline  10/16: 37/56 11/17: 42/56    Time  8    Period  Weeks    Status  New    Target Date  08/12/19      PT LONG TERM GOAL #4   Title  Patient will increase 10 meter walk test to >1.80ms as to improve  gait speed for better community ambulation and to reduce fall risk.    Baseline  10/16: 0.91 w/o AD 11/17: 1.43 m/s with no AD    Time  8    Period  Weeks    Status  Achieved      PT LONG TERM GOAL #5   Title  Patient will increase ABC scale  score >80% to demonstrate better functional mobility and better confidence with ADLs.    Baseline  10/16; 71% 11/17: 80%    Time  8    Period  Weeks    Status  Partially Met    Target Date  08/12/19            Plan - 08/05/19 3662    Clinical Impression Statement  In general, patient demonstrating good tolerance to therapy session this date, reasonable accommodations are alllowed in-session to allow adequate rest between activities as needed. All interventional executed without any exacerbation of pain or other symptoms.  Pt given intermittent multimodal cues to teach best possible form with exercises. Pt continues to make steady progress toward most goals. No home exercise updates made at this time.    Clinical Decision Making  Moderate    Rehab Potential  Fair    PT Frequency  2x / week    PT Duration  8 weeks    PT Treatment/Interventions  ADLs/Self Care Home Management;Aquatic Therapy;Cryotherapy;Moist Heat;Traction;Ultrasound;Therapeutic activities;Functional mobility training;Stair training;Gait training;DME Instruction;Therapeutic exercise;Balance training;Neuromuscular re-education;Patient/family education;Manual techniques;Passive range of motion;Energy conservation;Vestibular;Taping    PT Next Visit Plan  progress dynamic stability    PT Home Exercise Plan  No updates    Consulted and Agree with Plan of Care  Patient       Patient will benefit from skilled therapeutic intervention in order to improve the following deficits and impairments:  Abnormal gait, Cardiopulmonary status limiting activity, Decreased activity tolerance, Decreased balance, Decreased knowledge of precautions, Decreased endurance, Decreased coordination, Decreased cognition, Decreased knowledge of use of DME, Decreased mobility, Difficulty walking, Decreased safety awareness, Decreased strength, Impaired flexibility, Impaired perceived functional ability, Impaired sensation, Postural dysfunction, Improper body  mechanics, Pain  Visit Diagnosis: Unsteadiness on feet  Muscle weakness (generalized)  Other abnormalities of gait and mobility  Difficulty in walking, not elsewhere classified     Problem List Patient Active Problem List   Diagnosis Date Noted  . Status post abdominal aortic aneurysm (AAA) repair 04/15/2019  . Candidiasis 02/22/2019  . Malignant neoplasm of descending colon ([place])   . Benign neoplasm of descending colon   . Benign neoplasm of transverse colon   . Do not intubate, cardiopulmonary resuscitation (CPR)-only code status 10/08/2018  . Prostate cancer ([place]) 02/25/2018  . Prostatic cyst 01/15/2018  . 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 ([place]) 09/15/2017  . Blood in stool   . Abnormal feces   . Stricture and stenosis of esophagus   . Mild cognitive impairment 08/18/2017  . Senile purpura ([place]) 04/29/2017  . Anemia 04/29/2017  . Frequent falls 04/29/2017  . Acute respiratory failure ([place]) 12/17/2016  . Simple chronic bronchitis ([place]) 12/17/2016  . Benign localized hyperplasia of prostate with urinary obstruction 07/15/2016  . Elevated PSA 07/15/2016  . Incomplete emptying of bladder 07/15/2016  . Urge incontinence 07/15/2016  . Hypothyroidism 04/25/2016  . Macular degeneration 04/25/2016  . Erectile dysfunction due to arterial insufficiency   . Ischemic heart disease with chronotropic incompetence   . CN (constipation) 02/10/2015  . DD (diverticular disease) 02/10/2015  . Fatigue 02/10/2015  . Lichen planus  02/10/2015  . Restless leg 02/10/2015  . 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 B12 deficiency 02/10/2015  . COPD (chronic obstructive pulmonary disease) ([place]) 11/14/2014  . Post Inflammatory Lung Changes 11/14/2014  . PAF (paroxysmal atrial fibrillation) ([facility]) CHA2DS2-VASc = 4. AC = Eliquis   . Insomnia 12/09/2013  . OSA on CPAP   . Dyspnea on exertion - -essentially resolved with BB dose reduction & wgt loss 03/29/2013  . Overweight (BMI 25.0-29.9) --  Wgt back up 01/17/2013  . Atherosclerotic heart disease of native coronary artery without angina pectoris - s/p CABG, occluded SVG-D1; patent LIMA-LAD, SVG-OM, SVG- RCA   . Essential hypertension   . Hyperlipidemia with target LDL less than 70   . Aorto-iliac disease ([place])   . BCC (basal cell carcinoma), eyelid 01/03/2013  . Allergic rhinitis 08/17/2009  . Adaptation reaction 05/28/2009  . Acid reflux 05/28/2009  . Arthritis, degenerative 05/28/2009  . 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 Carotid artery occlusion without infarction 06/01/2006  . PAD (peripheral artery disease) - bilateral common iliac stents 12/15/2005  . Aortoiliac occlusive disease ([place]) 11/30/2005  . Atherosclerotic heart disease of artery bypass graft 09/01/1997   10:16 AM, 08/05/19 Etta Grandchild, PT, DPT Physical Therapist - [facility]  Outpatient Physical Therapy- [place] [phone]     Etta Grandchild 08/05/2019, 9:43 AM  [place] MAIN [facility] SERVICES [address] [place], [place], [address] Phone: [phone]   Fax:  [phone]  Name: [name] MRN: [id number] Date of Birth: 1934-04-08

## 2019-08-09 ENCOUNTER — Ambulatory Visit: Payer: Medicare Other

## 2019-08-09 ENCOUNTER — Other Ambulatory Visit: Payer: Self-pay

## 2019-08-09 DIAGNOSIS — M6281 Muscle weakness (generalized): Secondary | ICD-10-CM

## 2019-08-09 DIAGNOSIS — R2681 Unsteadiness on feet: Secondary | ICD-10-CM | POA: Diagnosis not present

## 2019-08-09 DIAGNOSIS — R2689 Other abnormalities of gait and mobility: Secondary | ICD-10-CM

## 2019-08-09 NOTE — Therapy (Signed)
[place] MAIN [facility] SERVICES [address] [place], [place], [address] Phone: [phone]   Fax:  [phone]  Physical Therapy Treatment/ RECERT  Patient Details  Name: [name] MRN: [id number] Date of Birth: 11/09/1933 Referring Provider (PT): Eulogio Ditch   Encounter Date: 08/09/2019  PT End of Session - 08/09/19 1000    Visit Number  14    Number of Visits  16    Date for PT Re-Evaluation  10/04/19    PT Start Time  0945    PT Stop Time  1029    PT Time Calculation (min)  44 min    Equipment Utilized During Treatment  Gait belt    Activity Tolerance  Patient tolerated treatment well    Behavior During Therapy  [facility] for tasks assessed/performed       Past Medical History:  Diagnosis Date  . AAA (abdominal aortic aneurysm) ([place])   . Arthritis   . Atherosclerotic heart disease of native coronary artery without angina pectoris 1998   - s/p CABG, occluded SVG-D1; patent LIMA-LAD, SVG-OM, SVG- RCA  . Basal cell carcinoma of eyelid 01/03/2013   2019 - R side of Nose  . Benign neoplasm of ascending colon   . Benign neoplasm of descending colon   . Cancer [facility])    Skin cancer  . Colon polyps   . COPD (chronic obstructive pulmonary disease) ([place])   . Coronary artery disease   . Dementia ([place])   . Dysrhythmia    atrial fib  . Falls frequently   . GERD (gastroesophageal reflux disease)   . Hemorrhoid 02/10/2015  . History of hiatal hernia   . History of kidney stones   . HOH (hard of hearing)   . Hypertension   . Memory loss   . Osteoporosis   . PAF (paroxysmal atrial fibrillation) ([place])   . Prostate cancer ([place])   . Prostate disorder   . Sciatic pain    Chronic  . Sleep apnea    cpap  . Temporary cerebral vascular dysfunction 02/10/2015   Had negative work-up.  July, 2012.  No medication changes, had forgot them that day, got dehydrated.   . Urinary incontinence     Past Surgical History:  Procedure Laterality  Date  . ABDOMINAL AORTIC ENDOVASCULAR STENT GRAFT Bilateral 05/18/2019   Procedure: ABDOMINAL AORTIC ENDOVASCULAR STENT GRAFT;  Surgeon: Algernon Huxley, MD;  Location: [facility] ORS;  Service: Vascular;  Laterality: Bilateral;  . ANKLE SURGERY    . Arch aortogram and carotid aortogram  06/02/2006   Dr Oneida Alar did surgery  . BASAL CELL CARCINOMA EXCISION  04/2016   Dermatology  . CARDIAC CATHETERIZATION  01/30/1998    (Dr. Linard Millers): Native LAD & mRCA CTO. 90% D1. Mod Cx. SVG-D1 CTO. SVG-RCA & SVG-OM along with LIMA-LAD patent;  LV NORMAL Fxn  . CAROTID ENDARTERECTOMY Left Oct. 29, 2007   Dr. Oneida Alar:  . CATARACT EXTRACTION W/PHACO Right 03/05/2017   Procedure: CATARACT EXTRACTION PHACO AND INTRAOCULAR LENS PLACEMENT (IOC);  Surgeon: Leandrew Koyanagi, MD;  Location: [facility] ORS;  Service: Ophthalmology;  Laterality: Right;  [place] 00:58.5 AP% 10.6 CDE 6.20 Fluid Pack lot # [id number]  . CATARACT EXTRACTION W/PHACO Left 04/15/2017   Procedure: CATARACT EXTRACTION PHACO AND INTRAOCULAR LENS PLACEMENT ([employer])  Left;  Surgeon: Leandrew Koyanagi, MD;  Location: [place];  Service: Ophthalmology;  Laterality: Left;  . COLONOSCOPY WITH PROPOFOL N/A 09/15/2017   Procedure: COLONOSCOPY WITH PROPOFOL;  Surgeon: Allen Norris,  Darren, MD;  Location: [place] ENDOSCOPY;  Service: Endoscopy;  Laterality: N/A;  . COLONOSCOPY WITH PROPOFOL N/A 10/26/2018   Procedure: COLONOSCOPY WITH PROPOFOL;  Surgeon: Lucilla Lame, MD;  Location: [facility] ENDOSCOPY;  Service: Endoscopy;  Laterality: N/A;  . CORONARY ARTERY BYPASS GRAFT  1998   LIMA-LAD, SVG-OM, SVG-RPDA, SVG-DIAG  . CPET / MET  12/2012   Mild chronotropic incompetence - read 82% of predicted; also reduced effort; peak VO2 15.7 / 75% (did not reach Max effort) -- suggested ischemic response in last 1.5 minutes of exercise. Normal pulmonary function on PFTs but poor response to  . DOPPLER ECHOCARDIOGRAPHY  11/20/2010   EF =>55%; LV norm mild aortic scelorosis  . ENDOVASCULAR  REPAIR/STENT GRAFT N/A 05/18/2019   Procedure: ENDOVASCULAR REPAIR/STENT GRAFT;  Surgeon: Algernon Huxley, MD;  Location: [place] CV LAB;  Service: Cardiovascular;  Laterality: N/A;  . ESOPHAGOGASTRODUODENOSCOPY (EGD) WITH PROPOFOL N/A 09/15/2017   Procedure: ESOPHAGOGASTRODUODENOSCOPY (EGD) WITH PROPOFOL;  Surgeon: Lucilla Lame, MD;  Location: [facility] ENDOSCOPY;  Service: Endoscopy;  Laterality: N/A;  . FRACTURE SURGERY Right 03/19/2015   wrist  . HIATAL HERNIA REPAIR    . ILIAC ARTERY STENT  12/15/2005   PTA and direct stenting rgt and lft common iliac arteries  . KYPHOPLASTY N/A 04/05/2019   Procedure: KYPHOPLASTY T12, L3, L4 L5;  Surgeon: Hessie Knows, MD;  Location: [facility] ORS;  Service: Orthopedics;  Laterality: N/A;  . NM GATED MYOVIEW ([place] [facility])  12/2017   Low Risk - no ischemia or infarct.  EF > 65%. no RWMA  . NM MYOVIEW ([place] [facility])  11/11/2010; February 2016   a) TM STRESS----NORMAL PERFUSION ,EF 68%; b) EF 50-55%. Normal LV function. No significant ischemia or infarction.  . OPEN REDUCTION INTERNAL FIXATION (ORIF) DISTAL RADIAL FRACTURE Left 01/13/2019   Procedure: OPEN REDUCTION INTERNAL FIXATION (ORIF) DISTAL  RADIAL FRACTURE - LEFT - SLEEP APNEA;  Surgeon: Hessie Knows, MD;  Location: [facility] ORS;  Service: Orthopedics;  Laterality: Left;  . ORIF ELBOW FRACTURE Right 01/01/2017   Procedure: OPEN REDUCTION INTERNAL FIXATION (ORIF) ELBOW/OLECRANON FRACTURE;  Surgeon: Hessie Knows, MD;  Location: [facility] ORS;  Service: Orthopedics;  Laterality: Right;  . ORIF WRIST FRACTURE Right 03/19/2015   Procedure: OPEN REDUCTION INTERNAL FIXATION (ORIF) WRIST FRACTURE;  Surgeon: Hessie Knows, MD;  Location: [facility] ORS;  Service: Orthopedics;  Laterality: Right;  . THORACIC AORTA - CAROTID ANGIOGRAM  October 2007   Dr. 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 TRANSTHORACIC ECHOCARDIOGRAM  February 2016   [facility]: Normal LV  function. Dilated left atrium.    There were no vitals filed for this visit.  Subjective Assessment - 08/09/19 0958    Subjective  Patient reports feeling very weak due to new onset of diahhrea. reports the doctor is unsure of the reason yet but is monitored. no falls or LOB since last session.    Pertinent History  Patient referred to physical therapy for balance/strength issues s/p abdominal aortic aneurysm (AAA) without rupture repair on 05/18/19 .  Patient has been seen by this therapist in the past (last seen in march of 2020) Patient had a fall on 01/08/19, MRI from [facility] dated 03/28/19 report acute compression fractures at T12, L3 and L4 as well as facet arthropathy at the lower lumbar levels and at L3-4 moderate central stenosis. Patient underwent injection on 10/6 at L4-5 and L5-S1. PMH includes AAA, arthritis, atherosclerotic heart disease, (s/p CABG), benign  neoplasm of ascending/descending colon, COPD, CAD, dementia, dysrhythmia, frequent falls, GERD, hemorrhoid, HOH, HTN, PAF, prostate cancer. Monday through Friday have 3 people come in different times from 730 to 12pm. Uses a walker occasionally now    Currently in Pain?  No/denies         [facility] PT Assessment - 08/09/19 0001      Berg Balance Test   Sit to Stand  Able to stand without using hands and stabilize independently    Standing Unsupported  Able to stand safely 2 minutes    Sitting with Back Unsupported but Feet Supported on Floor or Stool  Able to sit safely and securely 2 minutes    Stand to Sit  Sits safely with minimal use of hands    Transfers  Able to transfer safely, minor use of hands    Standing Unsupported with Eyes Closed  Able to stand 10 seconds with supervision    Standing Unsupported with Feet Together  Able to place feet together independently and stand 1 minute safely    From Standing, Reach Forward with Outstretched Arm  Can reach forward >12 cm safely (5")    From Standing Position, Pick up Object from Floor   Able to pick up shoe safely and easily    From Standing Position, Turn to Look Behind Over each Shoulder  Looks behind from both sides and weight shifts well    Turn 360 Degrees  Able to turn 360 degrees safely but slowly    Standing Unsupported, Alternately Place Feet on Step/Stool  Able to stand independently and complete 8 steps >20 seconds    Standing Unsupported, One Foot in Front  Able to plae foot ahead of the other independently and hold 30 seconds    Standing on One Leg  Tries to lift leg/unable to hold 3 seconds but remains standing independently    Total Score  47       Patient reports weakness due to new onset of diahrea.     Recert BLE strength: see goals:  5x STS: 13 seconds one posterior LOB.  BERG: 47/56  ABC: 83%   DGI: next session due to fatigue  SLS: LLE: 5 seconds, unable to perform on RLE   Treat:   In // bars:     Standing with #3ankle weight: CGA for stability:  -Hip extension withbilateralupper extremity support, cueing for neutral hip alignment, upright posture for optimal muscle recruitment, and sequencing, 12x each LE,  -Hip abduction withbilateralupper extremity support, cueing for neutral foot alignment for correct muscle activation, 10x each LE -hip flexion 12x each LE, focus on upright posture.   3lb ankle weights seated: -Marches 15x each LE, cueing for increasing arc of motion fo increased clearance.  -LAQ 15x each LE cueing for decreasing velocity for improved control and muscle contraction.  airex pad:  One foot on airex pad one foot on 7" step 30 secon dholds x2 trials each LE Toe taps 7" step SUE support   airex balance beam; side stepping 8x length with no UE support, one near LOB  Balloon taps reaching inside/outside BOS 3 minutes no LOB   Vitals monitored throughout sessionduring rest periods to ensure O2 saturation return to functional range prior to initiation of each exercise.   Pt educated throughout session  about proper posture and technique with exercises. Improved exercise technique, movement at target joints, use of target muscles after min to mod verbal, visual, tactile cues.  PT Education - 08/09/19 0958    Education provided  Yes    Education Details  goals, POC    Person(s) Educated  Patient    Methods  Explanation;Demonstration;Tactile cues;Verbal cues    Comprehension  Verbalized understanding;Returned demonstration;Verbal cues required;Tactile cues required       PT Short Term Goals - 08/09/19 1745      PT SHORT TERM GOAL #1   Title  Patient will be independent in home exercise program to improve strength/mobility for better functional independence with ADLs.    Baseline  10/16: HEP given 11/17: HEP compliant    Time  4    Period  Weeks    Status  Achieved    Target Date  07/15/19      PT SHORT TERM GOAL #2   Title  Patient will deny any falls over past 4 weeks to demonstrate improved safety awareness at home and work.    Baseline  10/16: one major fall 11/17: no falls    Time  4    Period  Weeks    Status  Achieved    Target Date  07/15/19        PT Long Term Goals - 08/09/19 1745      PT LONG TERM GOAL #1   Title  Patient will increase BLE gross strength to 4+/5 as to improve functional strength for independent gait, increased standing tolerance and increased ADL ability.    Baseline  10/16: R gross 4-/5 with add/ext 3+/5 L grossly 3+/5 with hip add/ext 2+/5 11/17: R 4/5 add/ext; -/5 L 4-/5 hip add/ext 3/5 12/8: R: 4/5 L 4-/5 hip add/ext 3+/5    Time  8    Period  Weeks    Status  Partially Met    Target Date  10/04/19      PT LONG TERM GOAL #2   Title  Patient (> 73 years old) will complete five times sit to stand test in < 15 seconds indicating an increased LE strength and improved balance.    Baseline  10/16: 17 seconds with heavy use of hands on knees 11/17: 16 seconds one near LOB 12/8: 13 seconds one posterior LOB    Time  8    Period  Weeks     Status  Achieved      PT LONG TERM GOAL #3   Title  Patient will increase Berg Balance score by > 6 points (43/56)  to demonstrate decreased fall risk during functional activities.    Baseline  10/16: 37/56 11/17: 42/56 12/8: 47/56    Time  8    Period  Weeks    Status  Achieved      PT LONG TERM GOAL #4   Title  Patient will increase 10 meter walk test to >1.16ms as to improve gait speed for better community ambulation and to reduce fall risk.    Baseline  10/16: 0.91 w/o AD 11/17: 1.43 m/s with no AD    Time  8    Period  Weeks    Status  Achieved      PT LONG TERM GOAL #5   Title  Patient will increase ABC scale score >80% to demonstrate better functional mobility and better confidence with ADLs.    Baseline  10/16; 71% 11/17: 80% 12/8: 83%    Time  8    Period  Weeks    Status  Achieved      Additional Long Term Goals   Additional  Long Term Goals  Yes      PT LONG TERM GOAL #6   Title  Patient will improve score on DGI to > or = 19/24 on the DGI to increase dynamic balance during mobility and to decrease risk of falls.    Baseline  12/8: perform next session    Time  8    Period  Weeks    Status  New    Target Date  10/04/19      PT LONG TERM GOAL #7   Title  Patient will increase Berg Balance score by > 6 points (53/56)  to demonstrate decreased fall risk during functional activities.    Baseline  12/8; 47/56    Time  8    Period  Weeks    Status  New    Target Date  10/04/19            Plan - 08/09/19 1745    Clinical Impression Statement  Patient is progressing towards functional goals at this time despite fatigue from recent onset of diarrhea; meeting two and progressing them.  Patient continues to be challenged with single limb stability as well as capacity for functional mobility. His static stability is improving with decreased episodes of LOB. Maximum improvement is yet to be obtained. The anticipated improvement is attainable and reasonable in a  generally predictable time.The patient would benefit from continued skilled PT intervention to maximize mobility, safety, and functional abilities.    Personal Factors and Comorbidities  Age;Comorbidity 3+;Fitness;Past/Current Experience;Sex;Social Background;Time since onset of injury/illness/exacerbation;Transportation    Comorbidities  AAA, arthritis, atherosclerotic heart disease, (s/p CABG), benign neoplasm of ascending/descending colon, COPD, CAD, dementia, dysrhythmia, frequent falls, GERD, hemorrhoid, HOH, HTN, PAF, prostate cancer.    Examination-Activity Limitations  Bathing;Bend;Carry;Dressing;Continence;Stairs;Squat;Reach Overhead;Locomotion Level;Lift;Stand;Transfers;Toileting    Examination-Participation Restrictions  Church;Cleaning;Community Activity;Driving;Interpersonal Relationship;Laundry;Volunteer;Shop;Personal Finances;Meal Prep;Yard Work    [employer] Potential  Fair    Clinical Impairments Affecting Rehab Potential  (+) good support system, high prior level of function (-) currently undergoing radiation therapy for prostate cancer, memory deficits     PT Frequency  2x / week    PT Duration  8 weeks    PT Treatment/Interventions  ADLs/Self Care Home Management;Aquatic Therapy;Cryotherapy;Moist Heat;Traction;Ultrasound;Therapeutic activities;Functional mobility training;Stair training;Gait training;DME Instruction;Therapeutic exercise;Balance training;Neuromuscular re-education;Patient/family education;Manual techniques;Passive range of motion;Energy conservation;Vestibular;Taping    PT Next Visit Plan  progress dynamic stability    PT Home Exercise Plan  see above    Consulted and Agree with Plan of Care  Patient       Patient will benefit from skilled therapeutic intervention in order to improve the following deficits and impairments:  Abnormal gait, Cardiopulmonary status limiting activity, Decreased activity tolerance, Decreased balance, Decreased knowledge of precautions,  Decreased endurance, Decreased coordination, Decreased cognition, Decreased knowledge of use of DME, Decreased mobility, Difficulty walking, Decreased safety awareness, Decreased strength, Impaired flexibility, Impaired perceived functional ability, Impaired sensation, Postural dysfunction, Improper body mechanics, Pain  Visit Diagnosis: Unsteadiness on feet  Muscle weakness (generalized)  Other abnormalities of gait and mobility     Problem List Patient Active Problem List   Diagnosis Date Noted  . Status post abdominal aortic aneurysm (AAA) repair 04/15/2019  . Candidiasis 02/22/2019  . Malignant neoplasm of descending colon ([place])   . Benign neoplasm of descending colon   . Benign neoplasm of transverse colon   . Do not intubate, cardiopulmonary resuscitation (CPR)-only code status 10/08/2018  . Prostate cancer ([place]) 02/25/2018  . Prostatic cyst 01/15/2018  . Adenocarcinoma  of sigmoid colon ([place]) 09/15/2017  . Blood in stool   . Abnormal feces   . Stricture and stenosis of esophagus   . Mild cognitive impairment 08/18/2017  . Senile purpura ([place]) 04/29/2017  . Anemia 04/29/2017  . Frequent falls 04/29/2017  . Acute respiratory failure ([place]) 12/17/2016  . Simple chronic bronchitis ([place]) 12/17/2016  . Benign localized hyperplasia of prostate with urinary obstruction 07/15/2016  . Elevated PSA 07/15/2016  . Incomplete emptying of bladder 07/15/2016  . Urge incontinence 07/15/2016  . Hypothyroidism 04/25/2016  . Macular degeneration 04/25/2016  . Erectile dysfunction due to arterial insufficiency   . Ischemic heart disease with chronotropic incompetence   . CN (constipation) 02/10/2015  . DD (diverticular disease) 02/10/2015  . Fatigue 02/10/2015  . Lichen planus 81/66/1969  . Restless leg 02/10/2015  . 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 B12 deficiency 02/10/2015  . COPD (chronic obstructive pulmonary disease) ([place]) 11/14/2014  . Post Inflammatory Lung Changes  11/14/2014  . PAF (paroxysmal atrial fibrillation) ([facility]) CHA2DS2-VASc = 4. AC = Eliquis   . Insomnia 12/09/2013  . OSA on CPAP   . Dyspnea on exertion - -essentially resolved with BB dose reduction & wgt loss 03/29/2013  . Overweight (BMI 25.0-29.9) -- Wgt back up 01/17/2013  . Atherosclerotic heart disease of native coronary artery without angina pectoris - s/p CABG, occluded SVG-D1; patent LIMA-LAD, SVG-OM, SVG- RCA   . Essential hypertension   . Hyperlipidemia with target LDL less than 70   . Aorto-iliac disease ([place])   . BCC (basal cell carcinoma), eyelid 01/03/2013  . Allergic rhinitis 08/17/2009  . Adaptation reaction 05/28/2009  . Acid reflux 05/28/2009  . Arthritis, degenerative 05/28/2009  . 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 Carotid artery occlusion without infarction 06/01/2006  . PAD (peripheral artery disease) - bilateral common iliac stents 12/15/2005  . Aortoiliac occlusive disease ([place]) 11/30/2005  . Atherosclerotic heart disease of artery bypass graft 09/01/1997  Janna Arch, PT, DPT    08/09/2019, 5:50 PM  [place] MAIN [facility] SERVICES [address] [place], [place], [address] Phone: [phone]   Fax:  [phone]  Name: [name] MRN: [id number] Date of Birth: 1934/08/18

## 2019-08-11 ENCOUNTER — Ambulatory Visit: Payer: Medicare Other

## 2019-08-11 ENCOUNTER — Other Ambulatory Visit: Payer: Self-pay

## 2019-08-11 DIAGNOSIS — M6281 Muscle weakness (generalized): Secondary | ICD-10-CM

## 2019-08-11 DIAGNOSIS — Q6689 Other  specified congenital deformities of feet: Secondary | ICD-10-CM | POA: Diagnosis not present

## 2019-08-11 DIAGNOSIS — R2681 Unsteadiness on feet: Secondary | ICD-10-CM

## 2019-08-11 DIAGNOSIS — M778 Other enthesopathies, not elsewhere classified: Secondary | ICD-10-CM | POA: Diagnosis not present

## 2019-08-11 DIAGNOSIS — R2689 Other abnormalities of gait and mobility: Secondary | ICD-10-CM

## 2019-08-11 NOTE — Therapy (Signed)
[place] [facility] SERVICES [address] [place], [place], [address] Phone: [phone]   Fax:  [phone]  Physical Therapy Treatment  Patient Details  Name: [name] MRN: [id number] Date of Birth: 09-23-1933 Referring Provider (PT): Eulogio Ditch   Encounter Date: 08/11/2019  PT End of Session - 08/11/19 0956    Visit Number  15    Number of Visits  16    Date for PT Re-Evaluation  10/04/19    PT Start Time  0940    PT Stop Time  1023    PT Time Calculation (min)  43 min    Equipment Utilized During Treatment  Gait belt    Activity Tolerance  Patient tolerated treatment well    Behavior During Therapy  [facility] for tasks assessed/performed       Past Medical History:  Diagnosis Date  . AAA (abdominal aortic aneurysm) ([place])   . Arthritis   . Atherosclerotic heart disease of native coronary artery without angina pectoris 1998   - s/p CABG, occluded SVG-D1; patent LIMA-LAD, SVG-OM, SVG- RCA  . Basal cell carcinoma of eyelid 01/03/2013   2019 - R side of Nose  . Benign neoplasm of ascending colon   . Benign neoplasm of descending colon   . Cancer [facility])    Skin cancer  . Colon polyps   . COPD (chronic obstructive pulmonary disease) ([place])   . Coronary artery disease   . Dementia ([place])   . Dysrhythmia    atrial fib  . Falls frequently   . GERD (gastroesophageal reflux disease)   . Hemorrhoid 02/10/2015  . History of hiatal hernia   . History of kidney stones   . HOH (hard of hearing)   . Hypertension   . Memory loss   . Osteoporosis   . PAF (paroxysmal atrial fibrillation) ([place])   . Prostate cancer ([place])   . Prostate disorder   . Sciatic pain    Chronic  . Sleep apnea    cpap  . Temporary cerebral vascular dysfunction 02/10/2015   Had negative work-up.  July, 2012.  No medication changes, had forgot them that day, got dehydrated.   . Urinary incontinence     Past Surgical History:  Procedure Laterality Date  .  ABDOMINAL AORTIC ENDOVASCULAR STENT GRAFT Bilateral 05/18/2019   Procedure: ABDOMINAL AORTIC ENDOVASCULAR STENT GRAFT;  Surgeon: Algernon Huxley, MD;  Location: [facility] ORS;  Service: Vascular;  Laterality: Bilateral;  . ANKLE SURGERY    . Arch aortogram and carotid aortogram  06/02/2006   Dr Oneida Alar did surgery  . BASAL CELL CARCINOMA EXCISION  04/2016   Dermatology  . CARDIAC CATHETERIZATION  01/30/1998    (Dr. Linard Millers): Native LAD & mRCA CTO. 90% D1. Mod Cx. SVG-D1 CTO. SVG-RCA & SVG-OM along with LIMA-LAD patent;  LV NORMAL Fxn  . CAROTID ENDARTERECTOMY Left Oct. 29, 2007   Dr. Oneida Alar:  . CATARACT EXTRACTION W/PHACO Right 03/05/2017   Procedure: CATARACT EXTRACTION PHACO AND INTRAOCULAR LENS PLACEMENT (IOC);  Surgeon: Leandrew Koyanagi, MD;  Location: [facility] ORS;  Service: Ophthalmology;  Laterality: Right;  [place] 00:58.5 AP% 10.6 CDE 6.20 Fluid Pack lot # [id number]  . CATARACT EXTRACTION W/PHACO Left 04/15/2017   Procedure: CATARACT EXTRACTION PHACO AND INTRAOCULAR LENS PLACEMENT ([place])  Left;  Surgeon: Leandrew Koyanagi, MD;  Location: [place];  Service: Ophthalmology;  Laterality: Left;  . COLONOSCOPY WITH PROPOFOL N/A 09/15/2017   Procedure: COLONOSCOPY WITH PROPOFOL;  Surgeon: Lucilla Lame,  MD;  Location: [facility] ENDOSCOPY;  Service: Endoscopy;  Laterality: N/A;  . COLONOSCOPY WITH PROPOFOL N/A 10/26/2018   Procedure: COLONOSCOPY WITH PROPOFOL;  Surgeon: Lucilla Lame, MD;  Location: [facility]COPY;  Service: Endoscopy;  Laterality: N/A;  . CORONARY ARTERY BYPASS GRAFT  1998   LIMA-LAD, SVG-OM, SVG-RPDA, SVG-DIAG  . CPET / MET  12/2012   Mild chronotropic incompetence - read 82% of predicted; also reduced effort; peak VO2 15.7 / 75% (did not reach Max effort) -- suggested ischemic response in last 1.5 minutes of exercise. Normal pulmonary function on PFTs but poor response to  . DOPPLER ECHOCARDIOGRAPHY  11/20/2010   EF =>55%; LV norm mild aortic scelorosis  . ENDOVASCULAR REPAIR/STENT  GRAFT N/A 05/18/2019   Procedure: ENDOVASCULAR REPAIR/STENT GRAFT;  Surgeon: Algernon Huxley, MD;  Location: [place] [facility] LAB;  Service: Cardiovascular;  Laterality: N/A;  . ESOPHAGOGASTRODUODENOSCOPY (EGD) WITH PROPOFOL N/A 09/15/2017   Procedure: ESOPHAGOGASTRODUODENOSCOPY (EGD) WITH PROPOFOL;  Surgeon: Lucilla Lame, MD;  Location: [facility] ENDOSCOPY;  Service: Endoscopy;  Laterality: N/A;  . FRACTURE SURGERY Right 03/19/2015   wrist  . HIATAL HERNIA REPAIR    . ILIAC ARTERY STENT  12/15/2005   PTA and direct stenting rgt and lft common iliac arteries  . KYPHOPLASTY N/A 04/05/2019   Procedure: KYPHOPLASTY T12, L3, L4 L5;  Surgeon: Hessie Knows, MD;  Location: [facility] ORS;  Service: Orthopedics;  Laterality: N/A;  . NM GATED MYOVIEW ([place] [facility]X)  12/2017   Low Risk - no ischemia or infarct.  EF > 65%. no RWMA  . NM MYOVIEW ([place] HX)  11/11/2010; February 2016   a) TM STRESS----NORMAL PERFUSION ,EF 68%; b) EF 50-55%. Normal LV function. No significant ischemia or infarction.  . OPEN REDUCTION INTERNAL FIXATION (ORIF) DISTAL RADIAL FRACTURE Left 01/13/2019   Procedure: OPEN REDUCTION INTERNAL FIXATION (ORIF) DISTAL  RADIAL FRACTURE - LEFT - SLEEP APNEA;  Surgeon: Hessie Knows, MD;  Location: [facility] OR[facility];  Service: Orthopedics;  Laterality: Left;  . ORIF ELBOW FRACTURE Right 01/01/2017   Procedure: OPEN REDUCTION INTERNAL FIXATION (ORIF) ELBOW/OLECRANON FRACTURE;  Surgeon: Hessie Knows, MD;  Location: [facility] OR[facility];  Service: Orthopedics;  Laterality: Right;  . ORIF WRIST FRACTURE Right 03/19/2015   Procedure: OPEN REDUCTION INTERNAL FIXATION (ORIF) WRIST FRACTURE;  Surgeon: Hessie Knows, MD;  Location: [facility] OR[facility];  Service: Orthopedics;  Laterality: Right;  . THORACIC AORTA - CAROTID ANGIOGRAM  October 2007   Dr. 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 TRANSTHORACIC ECHOCARDIOGRAM  February 2016   [facility]: Normal LV function. Dilated  left atrium.    There were no vitals filed for this visit.  Subjective Assessment - 08/11/19 0942    Subjective  Patient reports no falls or LOB since last session. Is getting a spot removed on his foot later today.    Pertinent History  Patient referred to physical therapy for balance/strength issues s/p abdominal aortic aneurysm (AAA) without rupture repair on 05/18/19 .  Patient has been seen by this therapist in the past (last seen in march of 2020) Patient had a fall on 01/08/19, MRI from [facility] dated 03/28/19 report acute compression fractures at T12, L3 and L4 as well as facet arthropathy at the lower lumbar levels and at L3-4 moderate central stenosis. Patient underwent injection on 10/6 at L4-5 and L5-S1. PMH includes AAA, arthritis, atherosclerotic heart disease, (s/p CABG), benign neoplasm of ascending/descending colon, COPD, CAD, dementia, dysrhythmia, frequent falls, GERD, hemorrhoid,  HOH, HTN, PAF, prostate cancer. Monday through Friday have 3 people come in different times from 730 to 12pm. Uses a walker occasionally now    Currently in Pain?  No/denies                  Neuromuscular Re-education: verbal cueing for task orientation and body mechanics for stability.    Weave between 6 cones; frequent knocking over of cones due to limited spatial awareness of foot position x 6 trials.   Ambulate in hallway with head turns based on PT direction; up, down, right, left 165 ft. CGA, looking up more challenging than other directions.   SUE support toe taps on 3 cones ; 8x each LE challenging for patient to perform  SUE support airex pad 7" step toe taps 20x   airex pad one foot on airex pad one foot on 7" step static hold 60 seconds x 2 trials each LE       Cross body opposite arm leg raises 10x, one side at a time consecutively, then 10x alternating  challenging to patient,    Therex:    5lb ankle weights: -standing marches BUE support 10x each LE  - standing abduction BUE  support 10x each LE -standing hip extension BUE support 10x each LE -seated LAQ 10x each LE hold 3 seconds  Standing df/pf 20x   Squats with UE support 10x; cueing for body mechanics  GTB hamstring curls seated 12x each LE    GTB abduction 15x seated  10 STS from standard height chair.      Vitals monitored throughout session during rest periods to ensure O2 saturation return to functional range prior to initiation of each exercise.      Pt educated throughout session about proper posture and technique with exercises. Improved exercise technique, movement at target joints, use of target muscles after min to mod verbal, visual, tactile cues.   Patient is progressing with functional strength and mobility with increased resistance being tolerated in strengthening interventions and decreased LOB with dynamic stability interventions. Patient continues to demonstrate excellent motivation throughout session. The patient would benefit from continued skilled PT intervention to maximize mobility, safety, and functional abilities                 PT Education - 08/11/19 0955    Education provided  Yes    Education Details  exercise technique, body mechanics    Person(s) Educated  Patient    Methods  Explanation;Demonstration;Tactile cues;Verbal cues    Comprehension  Verbalized understanding;Returned demonstration;Verbal cues required;Tactile cues required       PT Short Term Goals - 08/09/19 1745      PT SHORT TERM GOAL #1   Title  Patient will be independent in home exercise program to improve strength/mobility for better functional independence with ADLs.    Baseline  10/16: HEP given 11/17: HEP compliant    Time  4    Period  Weeks    Status  Achieved    Target Date  07/15/19      PT SHORT TERM GOAL #2   Title  Patient will deny any falls over past 4 weeks to demonstrate improved safety awareness at home and work.    Baseline  10/16: one major fall 11/17: no falls     Time  4    Period  Weeks    Status  Achieved    Target Date  07/15/19        PT  Long Term Goals - 08/09/19 1745      PT LONG TERM GOAL #1   Title  Patient will increase BLE gross strength to 4+/5 as to improve functional strength for independent gait, increased standing tolerance and increased ADL ability.    Baseline  10/16: R gross 4-/5 with add/ext 3+/5 L grossly 3+/5 with hip add/ext 2+/5 11/17: R 4/5 add/ext; -/5 L 4-/5 hip add/ext 3/5 12/8: R: 4/5 L 4-/5 hip add/ext 3+/5    Time  8    Period  Weeks    Status  Partially Met    Target Date  10/04/19      PT LONG TERM GOAL #2   Title  Patient (> 17 years old) will complete five times sit to stand test in < 15 seconds indicating an increased LE strength and improved balance.    Baseline  10/16: 17 seconds with heavy use of hands on knees 11/17: 16 seconds one near LOB 12/8: 13 seconds one posterior LOB    Time  8    Period  Weeks    Status  Achieved      PT LONG TERM GOAL #3   Title  Patient will increase Berg Balance score by > 6 points (43/56)  to demonstrate decreased fall risk during functional activities.    Baseline  10/16: 37/56 11/17: 42/56 12/8: 47/56    Time  8    Period  Weeks    Status  Achieved      PT LONG TERM GOAL #4   Title  Patient will increase 10 meter walk test to >1.97ms as to improve gait speed for better community ambulation and to reduce fall risk.    Baseline  10/16: 0.91 w/o AD 11/17: 1.43 m/s with no AD    Time  8    Period  Weeks    Status  Achieved      PT LONG TERM GOAL #5   Title  Patient will increase ABC scale score >80% to demonstrate better functional mobility and better confidence with ADLs.    Baseline  10/16; 71% 11/17: 80% 12/8: 83%    Time  8    Period  Weeks    Status  Achieved      Additional Long Term Goals   Additional Long Term Goals  Yes      PT LONG TERM GOAL #6   Title  Patient will improve score on DGI to > or = 19/24 on the DGI to increase dynamic balance during  mobility and to decrease risk of falls.    Baseline  12/8: perform next session    Time  8    Period  Weeks    Status  New    Target Date  10/04/19      PT LONG TERM GOAL #7   Title  Patient will increase Berg Balance score by > 6 points (53/56)  to demonstrate decreased fall risk during functional activities.    Baseline  12/8; 47/56    Time  8    Period  Weeks    Status  New    Target Date  10/04/19            Plan - 08/11/19 1009    Clinical Impression Statement  Patient is progressing with functional strength and mobility with increased resistance being tolerated in strengthening interventions and decreased LOB with dynamic stability interventions. Patient continues to demonstrate excellent motivation throughout session. The patient would benefit from continued  skilled PT intervention to maximize mobility, safety, and functional abilities    Personal Factors and Comorbidities  Age;Comorbidity 3+;Fitness;Past/Current Experience;Sex;Social Background;Time since onset of injury/illness/exacerbation;Transportation    Comorbidities  AAA, arthritis, atherosclerotic heart disease, (s/p CABG), benign neoplasm of ascending/descending colon, COPD, CAD, dementia, dysrhythmia, frequent falls, GERD, hemorrhoid, HOH, HTN, PAF, prostate cancer.    Examination-Activity Limitations  Bathing;Bend;Carry;Dressing;Continence;Stairs;Squat;Reach Overhead;Locomotion Level;Lift;Stand;Transfers;Toileting    Examination-Participation Restrictions  Church;Cleaning;Community Activity;Driving;Interpersonal Relationship;Laundry;Volunteer;Shop;Personal Finances;Meal Prep;Yard Work    [employer] Potential  Fair    Clinical Impairments Affecting Rehab Potential  (+) good support system, high prior level of function (-) currently undergoing radiation therapy for prostate cancer, memory deficits     PT Frequency  2x / week    PT Duration  8 weeks    PT Treatment/Interventions  ADLs/Self Care Home Management;Aquatic  Therapy;Cryotherapy;Moist Heat;Traction;Ultrasound;Therapeutic activities;Functional mobility training;Stair training;Gait training;DME Instruction;Therapeutic exercise;Balance training;Neuromuscular re-education;Patient/family education;Manual techniques;Passive range of motion;Energy conservation;Vestibular;Taping    PT Next Visit Plan  progress dynamic stability    PT Home Exercise Plan  see above    Consulted and Agree with Plan of Care  Patient       Patient will benefit from skilled therapeutic intervention in order to improve the following deficits and impairments:  Abnormal gait, Cardiopulmonary status limiting activity, Decreased activity tolerance, Decreased balance, Decreased knowledge of precautions, Decreased endurance, Decreased coordination, Decreased cognition, Decreased knowledge of use of DME, Decreased mobility, Difficulty walking, Decreased safety awareness, Decreased strength, Impaired flexibility, Impaired perceived functional ability, Impaired sensation, Postural dysfunction, Improper body mechanics, Pain  Visit Diagnosis: Unsteadiness on feet  Muscle weakness (generalized)  Other abnormalities of gait and mobility     Problem List Patient Active Problem List   Diagnosis Date Noted  . Status post abdominal aortic aneurysm (AAA) repair 04/15/2019  . Candidiasis 02/22/2019  . Malignant neoplasm of descending colon ([place])   . Benign neoplasm of descending colon   . Benign neoplasm of transverse colon   . Do not intubate, cardiopulmonary resuscitation (CPR)-only code status 10/08/2018  . Prostate cancer ([place]) 02/25/2018  . Prostatic cyst 01/15/2018  . 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 ([place]) 09/15/2017  . Blood in stool   . Abnormal feces   . Stricture and stenosis of esophagus   . Mild cognitive impairment 08/18/2017  . Senile purpura ([place]) 04/29/2017  . Anemia 04/29/2017  . Frequent falls 04/29/2017  . Acute respiratory failure ([place]) 12/17/2016  . Simple  chronic bronchitis ([place]) 12/17/2016  . Benign localized hyperplasia of prostate with urinary obstruction 07/15/2016  . Elevated PSA 07/15/2016  . Incomplete emptying of bladder 07/15/2016  . Urge incontinence 07/15/2016  . Hypothyroidism 04/25/2016  . Macular degeneration 04/25/2016  . Erectile dysfunction due to arterial insufficiency   . Ischemic heart disease with chronotropic incompetence   . CN (constipation) 02/10/2015  . DD (diverticular disease) 02/10/2015  . Fatigue 02/10/2015  . Lichen planus 50/35/4656  . Restless leg 02/10/2015  . 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 B12 deficiency 02/10/2015  . COPD (chronic obstructive pulmonary disease) ([place]) 11/14/2014  . Post Inflammatory Lung Changes 11/14/2014  . PAF (paroxysmal atrial fibrillation) ([facility]) CHA2DS2-VASc = 4. AC = Eliquis   . Insomnia 12/09/2013  . OSA on CPAP   . Dyspnea on exertion - -essentially resolved with BB dose reduction & wgt loss 03/29/2013  . Overweight (BMI 25.0-29.9) -- Wgt back up 01/17/2013  . Atherosclerotic heart disease of native coronary artery without angina pectoris - s/p CABG, occluded SVG-D1; patent LIMA-LAD, SVG-OM, SVG- RCA   .  Essential hypertension   . Hyperlipidemia with target LDL less than 70   . Aorto-iliac disease ([place])   . BCC (basal cell carcinoma), eyelid 01/03/2013  . Allergic rhinitis 08/17/2009  . Adaptation reaction 05/28/2009  . Acid reflux 05/28/2009  . Arthritis, degenerative 05/28/2009  . 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 Carotid artery occlusion without infarction 06/01/2006  . PAD (peripheral artery disease) - bilateral common iliac stents 12/15/2005  . Aortoiliac occlusive disease ([place]) 11/30/2005  . Atherosclerotic heart disease of artery bypass graft 09/01/1997   Janna Arch, PT, DPT   08/11/2019, 10:24 AM  [place] [facility] SERVICES [address], [place], [address] Phone: [phone]    Fax:  [phone]  Name: [name] MRN: [id number] Date of Birth: 1934/04/04

## 2019-08-16 ENCOUNTER — Other Ambulatory Visit: Payer: Self-pay

## 2019-08-16 ENCOUNTER — Ambulatory Visit: Payer: Medicare Other

## 2019-08-16 DIAGNOSIS — R2689 Other abnormalities of gait and mobility: Secondary | ICD-10-CM

## 2019-08-16 DIAGNOSIS — M6281 Muscle weakness (generalized): Secondary | ICD-10-CM

## 2019-08-16 DIAGNOSIS — R2681 Unsteadiness on feet: Secondary | ICD-10-CM

## 2019-08-16 NOTE — Therapy (Signed)
[place] MAIN [facility] SERVICES [address] [place], [place], [address] Phone: [phone]   Fax:  [phone]  Physical Therapy Treatment  Patient Details  Name: [name] MRN: [id number] Date of Birth: 01-23-1934 Referring Provider (PT): Eulogio Ditch   Encounter Date: 08/16/2019  PT End of Session - 08/16/19 0954    Visit Number  16    Number of Visits  30    Date for PT Re-Evaluation  10/04/19    PT Start Time  0948    PT Stop Time  1031    PT Time Calculation (min)  43 min    Equipment Utilized During Treatment  Gait belt    Activity Tolerance  Patient tolerated treatment well    Behavior During Therapy  [facility] for tasks assessed/performed       Past Medical History:  Diagnosis Date  . AAA (abdominal aortic aneurysm) ([place])   . Arthritis   . Atherosclerotic heart disease of native coronary artery without angina pectoris 1998   - s/p CABG, occluded SVG-D1; patent LIMA-LAD, SVG-OM, SVG- RCA  . Basal cell carcinoma of eyelid 01/03/2013   2019 - R side of Nose  . Benign neoplasm of ascending colon   . Benign neoplasm of descending colon   . Cancer [facility])    Skin cancer  . Colon polyps   . COPD (chronic obstructive pulmonary disease) ([place])   . Coronary artery disease   . Dementia ([place])   . Dysrhythmia    atrial fib  . Falls frequently   . GERD (gastroesophageal reflux disease)   . Hemorrhoid 02/10/2015  . History of hiatal hernia   . History of kidney stones   . HOH (hard of hearing)   . Hypertension   . Memory loss   . Osteoporosis   . PAF (paroxysmal atrial fibrillation) ([place])   . Prostate cancer ([place])   . Prostate disorder   . Sciatic pain    Chronic  . Sleep apnea    cpap  . Temporary cerebral vascular dysfunction 02/10/2015   Had negative work-up.  July, 2012.  No medication changes, had forgot them that day, got dehydrated.   . Urinary incontinence     Past Surgical History:  Procedure Laterality Date  .  ABDOMINAL AORTIC ENDOVASCULAR STENT GRAFT Bilateral 05/18/2019   Procedure: ABDOMINAL AORTIC ENDOVASCULAR STENT GRAFT;  Surgeon: Algernon Huxley, MD;  Location: [facility] ORS;  Service: Vascular;  Laterality: Bilateral;  . ANKLE SURGERY    . Arch aortogram and carotid aortogram  06/02/2006   Dr Oneida Alar did surgery  . BASAL CELL CARCINOMA EXCISION  04/2016   Dermatology  . CARDIAC CATHETERIZATION  01/30/1998    (Dr. Linard Millers): Native LAD & mRCA CTO. 90% D1. Mod Cx. SVG-D1 CTO. SVG-RCA & SVG-OM along with LIMA-LAD patent;  LV NORMAL Fxn  . CAROTID ENDARTERECTOMY Left Oct. 29, 2007   Dr. Oneida Alar:  . CATARACT EXTRACTION W/PHACO Right 03/05/2017   Procedure: CATARACT EXTRACTION PHACO AND INTRAOCULAR LENS PLACEMENT (IOC);  Surgeon: Leandrew Koyanagi, MD;  Location: [facility] ORS;  Service: Ophthalmology;  Laterality: Right;  [place] 00:58.5 AP% 10.6 CDE 6.20 Fluid Pack lot # [id number]  . CATARACT EXTRACTION W/PHACO Left 04/15/2017   Procedure: CATARACT EXTRACTION PHACO AND INTRAOCULAR LENS PLACEMENT ([place])  Left;  Surgeon: Leandrew Koyanagi, MD;  Location: [place];  Service: Ophthalmology;  Laterality: Left;  . COLONOSCOPY WITH PROPOFOL N/A 09/15/2017   Procedure: COLONOSCOPY WITH PROPOFOL;  Surgeon: Lucilla Lame,  MD;  Location: [facility] ENDOSCOPY;  Service: Endoscopy;  Laterality: N/A;  . COLONOSCOPY WITH PROPOFOL N/A 10/26/2018   Procedure: COLONOSCOPY WITH PROPOFOL;  Surgeon: Lucilla Lame, MD;  Location: [facility] ENDOSCOPY;  Service: Endoscopy;  Laterality: N/A;  . CORONARY ARTERY BYPASS GRAFT  1998   LIMA-LAD, SVG-OM, SVG-RPDA, SVG-DIAG  . CPET / MET  12/2012   Mild chronotropic incompetence - read 82% of predicted; also reduced effort; peak VO2 15.7 / 75% (did not reach Max effort) -- suggested ischemic response in last 1.5 minutes of exercise. Normal pulmonary function on PFTs but poor response to  . DOPPLER ECHOCARDIOGRAPHY  11/20/2010   EF =>55%; LV norm mild aortic scelorosis  . ENDOVASCULAR REPAIR/STENT  GRAFT N/A 05/18/2019   Procedure: ENDOVASCULAR REPAIR/STENT GRAFT;  Surgeon: Algernon Huxley, MD;  Location: [place] CV LAB;  Service: Cardiovascular;  Laterality: N/A;  . ESOPHAGOGASTRODUODENOSCOPY (EGD) WITH PROPOFOL N/A 09/15/2017   Procedure: ESOPHAGOGASTRODUODENOSCOPY (EGD) WITH PROPOFOL;  Surgeon: Lucilla Lame, MD;  Location: [facility] ENDOSCOPY;  Service: Endoscopy;  Laterality: N/A;  . FRACTURE SURGERY Right 03/19/2015   wrist  . HIATAL HERNIA REPAIR    . ILIAC ARTERY STENT  12/15/2005   PTA and direct stenting rgt and lft common iliac arteries  . KYPHOPLASTY N/A 04/05/2019   Procedure: KYPHOPLASTY T12, L3, L4 L5;  Surgeon: Hessie Knows, MD;  Location: [facility] ORS;  Service: Orthopedics;  Laterality: N/A;  . NM GATED MYOVIEW ([place] [facility])  12/2017   Low Risk - no ischemia or infarct.  EF > 65%. no RWMA  . NM MYOVIEW ([place] [facility]X)  11/11/2010; February 2016   a) TM STRESS----NORMAL PERFUSION ,EF 68%; b) EF 50-55%. Normal LV function. No significant ischemia or infarction.  . OPEN REDUCTION INTERNAL FIXATION (ORIF) DISTAL RADIAL FRACTURE Left 01/13/2019   Procedure: OPEN REDUCTION INTERNAL FIXATION (ORIF) DISTAL  RADIAL FRACTURE - LEFT - SLEEP APNEA;  Surgeon: Hessie Knows, MD;  Location: [facility] ORS;  Service: Orthopedics;  Laterality: Left;  . ORIF ELBOW FRACTURE Right 01/01/2017   Procedure: OPEN REDUCTION INTERNAL FIXATION (ORIF) ELBOW/OLECRANON FRACTURE;  Surgeon: Hessie Knows, MD;  Location: [facility] ORS;  Service: Orthopedics;  Laterality: Right;  . ORIF WRIST FRACTURE Right 03/19/2015   Procedure: OPEN REDUCTION INTERNAL FIXATION (ORIF) WRIST FRACTURE;  Surgeon: Hessie Knows, MD;  Location: [facility] ORS;  Service: Orthopedics;  Laterality: Right;  . THORACIC AORTA - CAROTID ANGIOGRAM  October 2007   Dr. 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 TRANSTHORACIC ECHOCARDIOGRAM  February 2016   [facility]: Normal LV function. Dilated  left atrium.    There were no vitals filed for this visit.  Subjective Assessment - 08/16/19 0953    Subjective  Patient reports no pain. Has been compliant with HEP. Hasn't been walking outside too  much due to the poor weather.    Pertinent History  Patient referred to physical therapy for balance/strength issues s/p abdominal aortic aneurysm (AAA) without rupture repair on 05/18/19 .  Patient has been seen by this therapist in the past (last seen in march of 2020) Patient had a fall on 01/08/19, MRI [facility] dated 03/28/19 report acute compression fractures at T12, L3 and L4 as well as facet arthropathy at the lower lumbar levels and at L3-4 moderate central stenosis. Patient underwent injection on 10/6 at L4-5 and L5-S1. PMH includes AAA, arthritis, atherosclerotic heart disease, (s/p CABG), benign neoplasm of ascending/descending colon, COPD, CAD, dementia, dysrhythmia, frequent falls,  GERD, hemorrhoid, HOH, HTN, PAF, prostate cancer. Monday through Friday have 3 people come in different times from 730 to 12pm. Uses a walker occasionally now    Currently in Pain?  No/denies             Nustep Lvl 4 RPM> 60 for cardiovascular challenge 4 minutes    Neuromuscular Re-education: verbal cueing for task orientation and body mechanics for stability.    Ambulate in hallway with direction for head turns forward, lateral, up, down 160 ft; occasional LOB  Ambulate in hallway with cueing for fast/slow gait speed changes with [id number] ft ; occasional LOB challenging to perform slow to fast.   With support bar airex pad: no UE support; horizontal head turns 30 seconds x 2, vertical head turns 30 seconds x2, one near LOB posterior   airex pad one foot on airex pad one foot on 6" step static hold 60 seconds x 2 trials each LE    airex pad: SUE support toe taps on 7" step. 12x each LE    Weave between 5 cones, intiitally knocked down 3 , then 2, then none. Initially challenged with spatial awareness and  coordination of feet but improved with repetition.   Search gym as ambulate lap around gym for cones high and low reaching inside/outside BOS     Therex:  4lb ankle weights   -standing hip flexion /step up 7" step BUE support 20x -standing hip extension 12x each LE BUE support -seated marches 10x each LE   Seated heel toe raises 20x Vitals monitored throughout session during rest periods to ensure O2 saturation return to functional range prior to initiation of each exercise.      Pt educated throughout session about proper posture and technique with exercises. Improved exercise technique, movement at target joints, use of target muscles after min to mod verbal, visual, tactile cues.  Patient presents with good motivation despite fatigue throughout session. He continues to progress with spatial awareness and coordination with decreased episodes of knocking cones over and improved stability with head turns. Change of speed is challenging. The patient would benefit from continued skilled PT intervention to maximize mobility, safety, and functional abilities                  PT Education - 08/16/19 0954    Education provided  Yes    Education Details  exercise technique, body mechanics    Person(s) Educated  Patient    Methods  Explanation;Demonstration;Tactile cues;Verbal cues    Comprehension  Verbalized understanding;Returned demonstration;Verbal cues required;Tactile cues required       PT Short Term Goals - 08/09/19 1745      PT SHORT TERM GOAL #1   Title  Patient will be independent in home exercise program to improve strength/mobility for better functional independence with ADLs.    Baseline  10/16: HEP given 11/17: HEP compliant    Time  4    Period  Weeks    Status  Achieved    Target Date  07/15/19      PT SHORT TERM GOAL #2   Title  Patient will deny any falls over past 4 weeks to demonstrate improved safety awareness at home and work.    Baseline   10/16: one major fall 11/17: no falls    Time  4    Period  Weeks    Status  Achieved    Target Date  07/15/19        PT Long  Term Goals - 08/09/19 1745      PT LONG TERM GOAL #1   Title  Patient will increase BLE gross strength to 4+/5 as to improve functional strength for independent gait, increased standing tolerance and increased ADL ability.    Baseline  10/16: R gross 4-/5 with add/ext 3+/5 L grossly 3+/5 with hip add/ext 2+/5 11/17: R 4/5 add/ext; -/5 L 4-/5 hip add/ext 3/5 12/8: R: 4/5 L 4-/5 hip add/ext 3+/5    Time  8    Period  Weeks    Status  Partially Met    Target Date  10/04/19      PT LONG TERM GOAL #2   Title  Patient (> 49 years old) will complete five times sit to stand test in < 15 seconds indicating an increased LE strength and improved balance.    Baseline  10/16: 17 seconds with heavy use of hands on knees 11/17: 16 seconds one near LOB 12/8: 13 seconds one posterior LOB    Time  8    Period  Weeks    Status  Achieved      PT LONG TERM GOAL #3   Title  Patient will increase Berg Balance score by > 6 points (43/56)  to demonstrate decreased fall risk during functional activities.    Baseline  10/16: 37/56 11/17: 42/56 12/8: 47/56    Time  8    Period  Weeks    Status  Achieved      PT LONG TERM GOAL #4   Title  Patient will increase 10 meter walk test to >1.57ms as to improve gait speed for better community ambulation and to reduce fall risk.    Baseline  10/16: 0.91 w/o AD 11/17: 1.43 m/s with no AD    Time  8    Period  Weeks    Status  Achieved      PT LONG TERM GOAL #5   Title  Patient will increase ABC scale score >80% to demonstrate better functional mobility and better confidence with ADLs.    Baseline  10/16; 71% 11/17: 80% 12/8: 83%    Time  8    Period  Weeks    Status  Achieved      Additional Long Term Goals   Additional Long Term Goals  Yes      PT LONG TERM GOAL #6   Title  Patient will improve score on DGI to > or = 19/24 on the  DGI to increase dynamic balance during mobility and to decrease risk of falls.    Baseline  12/8: perform next session    Time  8    Period  Weeks    Status  New    Target Date  10/04/19      PT LONG TERM GOAL #7   Title  Patient will increase Berg Balance score by > 6 points (53/56)  to demonstrate decreased fall risk during functional activities.    Baseline  12/8; 47/56    Time  8    Period  Weeks    Status  New    Target Date  10/04/19            Plan - 08/16/19 1036    Clinical Impression Statement  Patient presents with good motivation despite fatigue throughout session. He continues to progress with spatial awareness and coordination with decreased episodes of knocking cones over and improved stability with head turns. Change of speed is challenging. The patient  would benefit from continued skilled PT intervention to maximize mobility, safety, and functional abilities    Personal Factors and Comorbidities  Age;Comorbidity 3+;Fitness;Past/Current Experience;Sex;Social Background;Time since onset of injury/illness/exacerbation;Transportation    Comorbidities  AAA, arthritis, atherosclerotic heart disease, (s/p CABG), benign neoplasm of ascending/descending colon, COPD, CAD, dementia, dysrhythmia, frequent falls, GERD, hemorrhoid, HOH, HTN, PAF, prostate cancer.    Examination-Activity Limitations  Bathing;Bend;Carry;Dressing;Continence;Stairs;Squat;Reach Overhead;Locomotion Level;Lift;Stand;Transfers;Toileting    Examination-Participation Restrictions  Church;Cleaning;Community Activity;Driving;Interpersonal Relationship;Laundry;Volunteer;Shop;Personal Finances;Meal Prep;Yard Work    [employer] Potential  Fair    Clinical Impairments Affecting Rehab Potential  (+) good support system, high prior level of function (-) currently undergoing radiation therapy for prostate cancer, memory deficits     PT Frequency  2x / week    PT Duration  8 weeks    PT Treatment/Interventions  ADLs/Self  Care Home Management;Aquatic Therapy;Cryotherapy;Moist Heat;Traction;Ultrasound;Therapeutic activities;Functional mobility training;Stair training;Gait training;DME Instruction;Therapeutic exercise;Balance training;Neuromuscular re-education;Patient/family education;Manual techniques;Passive range of motion;Energy conservation;Vestibular;Taping    PT Next Visit Plan  progress dynamic stability    PT Home Exercise Plan  see above    Consulted and Agree with Plan of Care  Patient       Patient will benefit from skilled therapeutic intervention in order to improve the following deficits and impairments:  Abnormal gait, Cardiopulmonary status limiting activity, Decreased activity tolerance, Decreased balance, Decreased knowledge of precautions, Decreased endurance, Decreased coordination, Decreased cognition, Decreased knowledge of use of DME, Decreased mobility, Difficulty walking, Decreased safety awareness, Decreased strength, Impaired flexibility, Impaired perceived functional ability, Impaired sensation, Postural dysfunction, Improper body mechanics, Pain  Visit Diagnosis: Unsteadiness on feet  Muscle weakness (generalized)  Other abnormalities of gait and mobility     Problem List Patient Active Problem List   Diagnosis Date Noted  . Status post abdominal aortic aneurysm (AAA) repair 04/15/2019  . Candidiasis 02/22/2019  . Malignant neoplasm of descending colon ([place])   . Benign neoplasm of descending colon   . Benign neoplasm of transverse colon   . Do not intubate, cardiopulmonary resuscitation (CPR)-only code status 10/08/2018  . Prostate cancer ([place]) 02/25/2018  . Prostatic cyst 01/15/2018  . 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 ([place]) 09/15/2017  . Blood in stool   . Abnormal feces   . Stricture and stenosis of esophagus   . Mild cognitive impairment 08/18/2017  . Senile purpura ([place]) 04/29/2017  . Anemia 04/29/2017  . Frequent falls 04/29/2017  . Acute respiratory failure ([place])  12/17/2016  . Simple chronic bronchitis ([place]) 12/17/2016  . Benign localized hyperplasia of prostate with urinary obstruction 07/15/2016  . Elevated PSA 07/15/2016  . Incomplete emptying of bladder 07/15/2016  . Urge incontinence 07/15/2016  . Hypothyroidism 04/25/2016  . Macular degeneration 04/25/2016  . Erectile dysfunction due to arterial insufficiency   . Ischemic heart disease with chronotropic incompetence   . CN (constipation) 02/10/2015  . DD (diverticular disease) 02/10/2015  . Fatigue 02/10/2015  . Lichen planus 71/24/5809  . Restless leg 02/10/2015  . 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 B12 deficiency 02/10/2015  . COPD (chronic obstructive pulmonary disease) ([place]) 11/14/2014  . Post Inflammatory Lung Changes 11/14/2014  . PAF (paroxysmal atrial fibrillation) ([facility]) CHA2DS2-VASc = 4. AC = Eliquis   . Insomnia 12/09/2013  . OSA on CPAP   . Dyspnea on exertion - -essentially resolved with BB dose reduction & wgt loss 03/29/2013  . Overweight (BMI 25.0-29.9) -- Wgt back up 01/17/2013  . Atherosclerotic heart disease of native coronary artery without angina pectoris - s/p CABG, occluded SVG-D1; patent LIMA-LAD,  SVG-OM, SVG- RCA   . Essential hypertension   . Hyperlipidemia with target LDL less than 70   . Aorto-iliac disease ([place])   . BCC (basal cell carcinoma), eyelid 01/03/2013  . Allergic rhinitis 08/17/2009  . Adaptation reaction 05/28/2009  . Acid reflux 05/28/2009  . Arthritis, degenerative 05/28/2009  . 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 Carotid artery occlusion without infarction 06/01/2006  . PAD (peripheral artery disease) - bilateral common iliac stents 12/15/2005  . Aortoiliac occlusive disease ([place]) 11/30/2005  . Atherosclerotic heart disease of artery bypass graft 09/01/1997   Janna Arch, PT, DPT   08/16/2019, 10:37 AM  [place] MAIN [facility] SERVICES [address] [place], [place],  [address] Phone: [phone]   Fax:  [phone]  Name: [name] MRN: [id number] Date of Birth: December 17, 1933

## 2019-08-18 ENCOUNTER — Other Ambulatory Visit: Payer: Self-pay

## 2019-08-18 ENCOUNTER — Ambulatory Visit: Payer: Medicare Other

## 2019-08-18 DIAGNOSIS — M6281 Muscle weakness (generalized): Secondary | ICD-10-CM

## 2019-08-18 DIAGNOSIS — R2681 Unsteadiness on feet: Secondary | ICD-10-CM | POA: Diagnosis not present

## 2019-08-18 DIAGNOSIS — R2689 Other abnormalities of gait and mobility: Secondary | ICD-10-CM

## 2019-08-18 NOTE — Therapy (Signed)
[place] MAIN [facility] SERVICES [address] [place], [place], [address] Phone: [phone]   Fax:  [phone]  Physical Therapy Treatment  Patient Details  Name: [name] MRN: [id number] Date of Birth: October 17, 1933 Referring Provider (PT): Eulogio Ditch   Encounter Date: 08/18/2019  PT End of Session - 08/18/19 0949    Visit Number  17    Number of Visits  30    Date for PT Re-Evaluation  10/04/19    PT Start Time  0945    PT Stop Time  1029    PT Time Calculation (min)  44 min    Equipment Utilized During Treatment  Gait belt    Activity Tolerance  Patient tolerated treatment well    Behavior During Therapy  [facility] for tasks assessed/performed       Past Medical History:  Diagnosis Date  . AAA (abdominal aortic aneurysm) ([place])   . Arthritis   . Atherosclerotic heart disease of native coronary artery without angina pectoris 1998   - s/p CABG, occluded SVG-D1; patent LIMA-LAD, SVG-OM, SVG- RCA  . Basal cell carcinoma of eyelid 01/03/2013   2019 - R side of Nose  . Benign neoplasm of ascending colon   . Benign neoplasm of descending colon   . Cancer [facility])    Skin cancer  . Colon polyps   . COPD (chronic obstructive pulmonary disease) ([place])   . Coronary artery disease   . Dementia ([place])   . Dysrhythmia    atrial fib  . Falls frequently   . GERD (gastroesophageal reflux disease)   . Hemorrhoid 02/10/2015  . History of hiatal hernia   . History of kidney stones   . HOH (hard of hearing)   . Hypertension   . Memory loss   . Osteoporosis   . PAF (paroxysmal atrial fibrillation) ([place])   . Prostate cancer ([place])   . Prostate disorder   . Sciatic pain    Chronic  . Sleep apnea    cpap  . Temporary cerebral vascular dysfunction 02/10/2015   Had negative work-up.  July, 2012.  No medication changes, had forgot them that day, got dehydrated.   . Urinary incontinence     Past Surgical History:  Procedure Laterality Date  .  ABDOMINAL AORTIC ENDOVASCULAR STENT GRAFT Bilateral 05/18/2019   Procedure: ABDOMINAL AORTIC ENDOVASCULAR STENT GRAFT;  Surgeon: Algernon Huxley, MD;  Location: [facility] ORS;  Service: Vascular;  Laterality: Bilateral;  . ANKLE SURGERY    . Arch aortogram and carotid aortogram  06/02/2006   Dr Oneida Alar did surgery  . BASAL CELL CARCINOMA EXCISION  04/2016   Dermatology  . CARDIAC CATHETERIZATION  01/30/1998    (Dr. Linard Millers): Native LAD & mRCA CTO. 90% D1. Mod Cx. SVG-D1 CTO. SVG-RCA & SVG-OM along with LIMA-LAD patent;  LV NORMAL Fxn  . CAROTID ENDARTERECTOMY Left Oct. 29, 2007   Dr. Oneida Alar:  . CATARACT EXTRACTION W/PHACO Right 03/05/2017   Procedure: CATARACT EXTRACTION PHACO AND INTRAOCULAR LENS PLACEMENT (IOC);  Surgeon: Leandrew Koyanagi, MD;  Location: [facility] ORS;  Service: Ophthalmology;  Laterality: Right;  [place] 00:58.5 AP% 10.6 CDE 6.20 Fluid Pack lot # [id number]  . CATARACT EXTRACTION W/PHACO Left 04/15/2017   Procedure: CATARACT EXTRACTION PHACO AND INTRAOCULAR LENS PLACEMENT ([place])  Left;  Surgeon: Leandrew Koyanagi, MD;  Location: [place];  Service: Ophthalmology;  Laterality: Left;  . COLONOSCOPY WITH PROPOFOL N/A 09/15/2017   Procedure: COLONOSCOPY WITH PROPOFOL;  Surgeon: Lucilla Lame,  MD;  Location: [facility] ENDOSCOPY;  Service: Endoscopy;  Laterality: N/A;  . COLONOSCOPY WITH PROPOFOL N/A 10/26/2018   Procedure: COLONOSCOPY WITH PROPOFOL;  Surgeon: Lucilla Lame, MD;  Location: [facility] ENDOSCOPY;  Service: Endoscopy;  Laterality: N/A;  . CORONARY ARTERY BYPASS GRAFT  1998   LIMA-LAD, SVG-OM, SVG-RPDA, SVG-DIAG  . CPET / MET  12/2012   Mild chronotropic incompetence - read 82% of predicted; also reduced effort; peak VO2 15.7 / 75% (did not reach Max effort) -- suggested ischemic response in last 1.5 minutes of exercise. Normal pulmonary function on PFTs but poor response to  . DOPPLER ECHOCARDIOGRAPHY  11/20/2010   EF =>55%; LV norm mild aortic scelorosis  . ENDOVASCULAR REPAIR/STENT  GRAFT N/A 05/18/2019   Procedure: ENDOVASCULAR REPAIR/STENT GRAFT;  Surgeon: Algernon Huxley, MD;  Location: [place] [facility];  Service: Cardiovascular;  Laterality: N/A;  . ESOPHAGOGASTRODUODENOSCOPY (EGD) WITH PROPOFOL N/A 09/15/2017   Procedure: ESOPHAGOGASTRODUODENOSCOPY (EGD) WITH PROPOFOL;  Surgeon: Lucilla Lame, MD;  Location: [facility] ENDOSCOPY;  Service: Endoscopy;  Laterality: N/A;  . FRACTURE SURGERY Right 03/19/2015   wrist  . HIATAL HERNIA REPAIR    . ILIAC ARTERY STENT  12/15/2005   PTA and direct stenting rgt and lft common iliac arteries  . KYPHOPLASTY N/A 04/05/2019   Procedure: KYPHOPLASTY T12, L3, L4 L5;  Surgeon: Hessie Knows, MD;  Location: [facility] ORS;  Service: Orthopedics;  Laterality: N/A;  . NM GATED MYOVIEW ([place] [facility])  12/2017   Low Risk - no ischemia or infarct.  EF > 65%. no RWMA  . NM MYOVIEW ([place] HX)  11/11/2010; February 2016   a) TM STRESS----NORMAL PERFUSION ,EF 68%; b) EF 50-55%. Normal LV function. No significant ischemia or infarction.  . OPEN REDUCTION INTERNAL FIXATION (ORIF) DISTAL RADIAL FRACTURE Left 01/13/2019   Procedure: OPEN REDUCTION INTERNAL FIXATION (ORIF) DISTAL  RADIAL FRACTURE - LEFT - SLEEP APNEA;  Surgeon: Hessie Knows, MD;  Location: [facility] ORS;  Service: Orthopedics;  Laterality: Left;  . ORIF ELBOW FRACTURE Right 01/01/2017   Procedure: OPEN REDUCTION INTERNAL FIXATION (ORIF) ELBOW/OLECRANON FRACTURE;  Surgeon: Hessie Knows, MD;  Location: [facility] ORS;  Service: Orthopedics;  Laterality: Right;  . ORIF WRIST FRACTURE Right 03/19/2015   Procedure: OPEN REDUCTION INTERNAL FIXATION (ORIF) WRIST FRACTURE;  Surgeon: Hessie Knows, MD;  Location: [facility] ORS;  Service: Orthopedics;  Laterality: Right;  . THORACIC AORTA - CAROTID ANGIOGRAM  October 2007   Dr. 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 TRANSTHORACIC ECHOCARDIOGRAM  February 2016   [facility]: Normal LV function. Dilated  left atrium.    There were no vitals filed for this visit.  Subjective Assessment - 08/18/19 0948    Subjective  Patient reports no pain this session. No falls or LOB since last session. Reports doing his exercises intermittently "when he gets around to them".    Pertinent History  Patient referred to physical therapy for balance/strength issues s/p abdominal aortic aneurysm (AAA) without rupture repair on 05/18/19 .  Patient has been seen by this therapist in the past (last seen in march of 2020) Patient had a fall on 01/08/19, MRI from [facility] dated 03/28/19 report acute compression fractures at T12, L3 and L4 as well as facet arthropathy at the lower lumbar levels and at L3-4 moderate central stenosis. Patient underwent injection on 10/6 at L4-5 and L5-S1. PMH includes AAA, arthritis, atherosclerotic heart disease, (s/p CABG), benign neoplasm of ascending/descending colon, COPD, CAD, dementia,  dysrhythmia, frequent falls, GERD, hemorrhoid, HOH, HTN, PAF, prostate cancer. Monday through Friday have 3 people come in different times from 730 to 12pm. Uses a walker occasionally now    Currently in Pain?  No/denies                   Treatment:  ambulate >1000 ft with CGA and no AD, cueing for increasing step length , safety negotiating turns/corners. And safety awareness. One near LOB, very fatigued by end of ambulation  Standing marches no UE support 12x each LE, more challenging to lift LLE than RLE   GTB abduction seated 20x   GTB marching seated with upright posture 20x  GTB pf 15x each LE    GTB hamstring curls seated 12x each LE   Standing df/pf 20x   Squats with UE support 10x; cueing for body mechanics   10 STS from standard height chair. ; 2 sets, improved use of LE's with hands on knees second set.   Seated adduction ball squeeze 10x 5 second holds   30 second toe taps on 4" step seated for coordination, spatial awareness, and muscle sequencing ; 2 trials improved  coordination with second set.    Vitals monitored throughout session during rest periods to ensure O2 saturation return to functional range prior to initiation of each exercise.      Pt educated throughout session about proper posture and technique with exercises. Improved exercise technique, movement at target joints, use of target muscles after min to mod verbal, visual, tactile cues.   Patient fatigues with prolonged ambulation and will continue to benefit from focused intervention for promotion of capacity for prolonged mobility.  Patient requires rest breaks due to fatigue from standing interventions but is able to perform multiple seated interventions in a row without rest. The patient would benefit from continued skilled PT intervention to maximize mobility, safety, and functional abilities               PT Education - 08/18/19 0949    Education provided  Yes    Education Details  exercise technique, body mechanics    Person(s) Educated  Patient    Methods  Explanation;Demonstration;Tactile cues;Verbal cues    Comprehension  Verbalized understanding;Returned demonstration;Verbal cues required;Tactile cues required       PT Short Term Goals - 08/09/19 1745      PT SHORT TERM GOAL #1   Title  Patient will be independent in home exercise program to improve strength/mobility for better functional independence with ADLs.    Baseline  10/16: HEP given 11/17: HEP compliant    Time  4    Period  Weeks    Status  Achieved    Target Date  07/15/19      PT SHORT TERM GOAL #2   Title  Patient will deny any falls over past 4 weeks to demonstrate improved safety awareness at home and work.    Baseline  10/16: one major fall 11/17: no falls    Time  4    Period  Weeks    Status  Achieved    Target Date  07/15/19        PT Long Term Goals - 08/09/19 1745      PT LONG TERM GOAL #1   Title  Patient will increase BLE gross strength to 4+/5 as to improve functional strength  for independent gait, increased standing tolerance and increased ADL ability.    Baseline  10/16: R gross 4-/5  with add/ext 3+/5 L grossly 3+/5 with hip add/ext 2+/5 11/17: R 4/5 add/ext; -/5 L 4-/5 hip add/ext 3/5 12/8: R: 4/5 L 4-/5 hip add/ext 3+/5    Time  8    Period  Weeks    Status  Partially Met    Target Date  10/04/19      PT LONG TERM GOAL #2   Title  Patient (> 47 years old) will complete five times sit to stand test in < 15 seconds indicating an increased LE strength and improved balance.    Baseline  10/16: 17 seconds with heavy use of hands on knees 11/17: 16 seconds one near LOB 12/8: 13 seconds one posterior LOB    Time  8    Period  Weeks    Status  Achieved      PT LONG TERM GOAL #3   Title  Patient will increase Berg Balance score by > 6 points (43/56)  to demonstrate decreased fall risk during functional activities.    Baseline  10/16: 37/56 11/17: 42/56 12/8: 47/56    Time  8    Period  Weeks    Status  Achieved      PT LONG TERM GOAL #4   Title  Patient will increase 10 meter walk test to >1.64ms as to improve gait speed for better community ambulation and to reduce fall risk.    Baseline  10/16: 0.91 w/o AD 11/17: 1.43 m/s with no AD    Time  8    Period  Weeks    Status  Achieved      PT LONG TERM GOAL #5   Title  Patient will increase ABC scale score >80% to demonstrate better functional mobility and better confidence with ADLs.    Baseline  10/16; 71% 11/17: 80% 12/8: 83%    Time  8    Period  Weeks    Status  Achieved      Additional Long Term Goals   Additional Long Term Goals  Yes      PT LONG TERM GOAL #6   Title  Patient will improve score on DGI to > or = 19/24 on the DGI to increase dynamic balance during mobility and to decrease risk of falls.    Baseline  12/8: perform next session    Time  8    Period  Weeks    Status  New    Target Date  10/04/19      PT LONG TERM GOAL #7   Title  Patient will increase Berg Balance score by > 6  points (53/56)  to demonstrate decreased fall risk during functional activities.    Baseline  12/8; 47/56    Time  8    Period  Weeks    Status  New    Target Date  10/04/19            Plan - 08/18/19 1006    Clinical Impression Statement  Patient fatigues with prolonged ambulation and will continue to benefit from focused intervention for promotion of capacity for prolonged mobility.  Patient requires rest breaks due to fatigue from standing interventions but is able to perform multiple seated interventions in a row without rest. The patient would benefit from continued skilled PT intervention to maximize mobility, safety, and functional abilities    Personal Factors and Comorbidities  Age;Comorbidity 3+;Fitness;Past/Current Experience;Sex;Social Background;Time since onset of injury/illness/exacerbation;Transportation    Comorbidities  AAA, arthritis, atherosclerotic heart disease, (s/p CABG), benign  neoplasm of ascending/descending colon, COPD, CAD, dementia, dysrhythmia, frequent falls, GERD, hemorrhoid, HOH, HTN, PAF, prostate cancer.    Examination-Activity Limitations  Bathing;Bend;Carry;Dressing;Continence;Stairs;Squat;Reach Overhead;Locomotion Level;Lift;Stand;Transfers;Toileting    Examination-Participation Restrictions  Church;Cleaning;Community Activity;Driving;Interpersonal Relationship;Laundry;Volunteer;Shop;Personal Finances;Meal Prep;Yard Work    [employer] Potential  Fair    Clinical Impairments Affecting Rehab Potential  (+) good support system, high prior level of function (-) currently undergoing radiation therapy for prostate cancer, memory deficits     PT Frequency  2x / week    PT Duration  8 weeks    PT Treatment/Interventions  ADLs/Self Care Home Management;Aquatic Therapy;Cryotherapy;Moist Heat;Traction;Ultrasound;Therapeutic activities;Functional mobility training;Stair training;Gait training;DME Instruction;Therapeutic exercise;Balance training;Neuromuscular  re-education;Patient/family education;Manual techniques;Passive range of motion;Energy conservation;Vestibular;Taping    PT Next Visit Plan  progress dynamic stability    PT Home Exercise Plan  see above    Consulted and Agree with Plan of Care  Patient       Patient will benefit from skilled therapeutic intervention in order to improve the following deficits and impairments:  Abnormal gait, Cardiopulmonary status limiting activity, Decreased activity tolerance, Decreased balance, Decreased knowledge of precautions, Decreased endurance, Decreased coordination, Decreased cognition, Decreased knowledge of use of DME, Decreased mobility, Difficulty walking, Decreased safety awareness, Decreased strength, Impaired flexibility, Impaired perceived functional ability, Impaired sensation, Postural dysfunction, Improper body mechanics, Pain  Visit Diagnosis: Unsteadiness on feet  Muscle weakness (generalized)  Other abnormalities of gait and mobility     Problem List Patient Active Problem List   Diagnosis Date Noted  . Status post abdominal aortic aneurysm (AAA) repair 04/15/2019  . Candidiasis 02/22/2019  . Malignant neoplasm of descending colon ([place])   . Benign neoplasm of descending colon   . Benign neoplasm of transverse colon   . Do not intubate, cardiopulmonary resuscitation (CPR)-only code status 10/08/2018  . Prostate cancer ([place]) 02/25/2018  . Prostatic cyst 01/15/2018  . 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 ([place]) 09/15/2017  . Blood in stool   . Abnormal feces   . Stricture and stenosis of esophagus   . Mild cognitive impairment 08/18/2017  . Senile purpura ([place]) 04/29/2017  . Anemia 04/29/2017  . Frequent falls 04/29/2017  . Acute respiratory failure ([place]) 12/17/2016  . Simple chronic bronchitis ([place]) 12/17/2016  . Benign localized hyperplasia of prostate with urinary obstruction 07/15/2016  . Elevated PSA 07/15/2016  . Incomplete emptying of bladder 07/15/2016  . Urge  incontinence 07/15/2016  . Hypothyroidism 04/25/2016  . Macular degeneration 04/25/2016  . Erectile dysfunction due to arterial insufficiency   . Ischemic heart disease with chronotropic incompetence   . CN (constipation) 02/10/2015  . DD (diverticular disease) 02/10/2015  . Fatigue 02/10/2015  . Lichen planus 56/38/9373  . Restless leg 02/10/2015  . 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 B12 deficiency 02/10/2015  . COPD (chronic obstructive pulmonary disease) ([place]) 11/14/2014  . Post Inflammatory Lung Changes 11/14/2014  . PAF (paroxysmal atrial fibrillation) ([facility]) CHA2DS2-VASc = 4. AC = Eliquis   . Insomnia 12/09/2013  . OSA on CPAP   . Dyspnea on exertion - -essentially resolved with BB dose reduction & wgt loss 03/29/2013  . Overweight (BMI 25.0-29.9) -- Wgt back up 01/17/2013  . Atherosclerotic heart disease of native coronary artery without angina pectoris - s/p CABG, occluded SVG-D1; patent LIMA-LAD, SVG-OM, SVG- RCA   . Essential hypertension   . Hyperlipidemia with target LDL less than 70   . Aorto-iliac disease ([place])   . BCC (basal cell carcinoma), eyelid 01/03/2013  . Allergic rhinitis 08/17/2009  . Adaptation reaction 05/28/2009  .  Acid reflux 05/28/2009  . Arthritis, degenerative 05/28/2009  . 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 Carotid artery occlusion without infarction 06/01/2006  . PAD (peripheral artery disease) - bilateral common iliac stents 12/15/2005  . Aortoiliac occlusive disease ([place]) 11/30/2005  . Atherosclerotic heart disease of artery bypass graft 09/01/1997   Janna Arch, PT, DPT   08/18/2019, 10:29 AM  [place] MAIN [facility] SERVICES [address] [place], [place], [address] Phone: [phone]   Fax:  [phone]  Name: [name] MRN: [id number] Date of Birth: 1933/10/06

## 2019-08-23 ENCOUNTER — Other Ambulatory Visit: Payer: Self-pay

## 2019-08-23 ENCOUNTER — Ambulatory Visit: Payer: Medicare Other

## 2019-08-23 DIAGNOSIS — R2689 Other abnormalities of gait and mobility: Secondary | ICD-10-CM

## 2019-08-23 DIAGNOSIS — R2681 Unsteadiness on feet: Secondary | ICD-10-CM

## 2019-08-23 DIAGNOSIS — M6281 Muscle weakness (generalized): Secondary | ICD-10-CM

## 2019-08-23 NOTE — Therapy (Signed)
[place] MAIN [facility] SERVICES [address] [place], [place], [address] Phone: [phone]   Fax:  [phone]  Physical Therapy Treatment  Patient Details  Name: [name] MRN: [id number] Date of Birth: 30-Dec-1933 Referring Provider (PT): Eulogio Ditch   Encounter Date: 08/23/2019  PT End of Session - 08/23/19 0949    Visit Number  18    Number of Visits  30    Date for PT Re-Evaluation  10/04/19    PT Start Time  0945    PT Stop Time  1027    PT Time Calculation (min)  42 min    Equipment Utilized During Treatment  Gait belt    Activity Tolerance  Patient tolerated treatment well    Behavior During Therapy  [facility] for tasks assessed/performed       Past Medical History:  Diagnosis Date  . AAA (abdominal aortic aneurysm) ([place])   . Arthritis   . Atherosclerotic heart disease of native coronary artery without angina pectoris 1998   - s/p CABG, occluded SVG-D1; patent LIMA-LAD, SVG-OM, SVG- RCA  . Basal cell carcinoma of eyelid 01/03/2013   2019 - R side of Nose  . Benign neoplasm of ascending colon   . Benign neoplasm of descending colon   . Cancer [facility])    Skin cancer  . Colon polyps   . COPD (chronic obstructive pulmonary disease) ([place])   . Coronary artery disease   . Dementia ([place])   . Dysrhythmia    atrial fib  . Falls frequently   . GERD (gastroesophageal reflux disease)   . Hemorrhoid 02/10/2015  . History of hiatal hernia   . History of kidney stones   . HOH (hard of hearing)   . Hypertension   . Memory loss   . Osteoporosis   . PAF (paroxysmal atrial fibrillation) ([place])   . Prostate cancer ([place])   . Prostate disorder   . Sciatic pain    Chronic  . Sleep apnea    cpap  . Temporary cerebral vascular dysfunction 02/10/2015   Had negative work-up.  July, 2012.  No medication changes, had forgot them that day, got dehydrated.   . Urinary incontinence     Past Surgical History:  Procedure Laterality Date  .  ABDOMINAL AORTIC ENDOVASCULAR STENT GRAFT Bilateral 05/18/2019   Procedure: ABDOMINAL AORTIC ENDOVASCULAR STENT GRAFT;  Surgeon: Algernon Huxley, MD;  Location: [facility] ORS;  Service: Vascular;  Laterality: Bilateral;  . ANKLE SURGERY    . Arch aortogram and carotid aortogram  06/02/2006   Dr Oneida Alar did surgery  . BASAL CELL CARCINOMA EXCISION  04/2016   Dermatology  . CARDIAC CATHETERIZATION  01/30/1998    (Dr. Linard Millers): Native LAD & mRCA CTO. 90% D1. Mod Cx. SVG-D1 CTO. SVG-RCA & SVG-OM along with LIMA-LAD patent;  LV NORMAL Fxn  . CAROTID ENDARTERECTOMY Left Oct. 29, 2007   Dr. Oneida Alar:  . CATARACT EXTRACTION W/PHACO Right 03/05/2017   Procedure: CATARACT EXTRACTION PHACO AND INTRAOCULAR LENS PLACEMENT (IOC);  Surgeon: Leandrew Koyanagi, MD;  Location: [facility] ORS;  Service: Ophthalmology;  Laterality: Right;  [place] 00:58.5 AP% 10.6 CDE 6.20 Fluid Pack lot # [id number]  . CATARACT EXTRACTION W/PHACO Left 04/15/2017   Procedure: CATARACT EXTRACTION PHACO AND INTRAOCULAR LENS PLACEMENT ([place])  Left;  Surgeon: Leandrew Koyanagi, MD;  Location: [facility];  Service: Ophthalmology;  Laterality: Left;  . COLONOSCOPY WITH PROPOFOL N/A 09/15/2017   Procedure: COLONOSCOPY WITH PROPOFOL;  Surgeon: Lucilla Lame,  MD;  Location: [facility] ENDOSCOPY;  Service: Endoscopy;  Laterality: N/A;  . COLONOSCOPY WITH PROPOFOL N/A 10/26/2018   Procedure: COLONOSCOPY WITH PROPOFOL;  Surgeon: Lucilla Lame, MD;  Location: [facility] ENDOSCOPY;  Service: Endoscopy;  Laterality: N/A;  . CORONARY ARTERY BYPASS GRAFT  1998   LIMA-LAD, SVG-OM, SVG-RPDA, SVG-DIAG  . CPET / MET  12/2012   Mild chronotropic incompetence - read 82% of predicted; also reduced effort; peak VO2 15.7 / 75% (did not reach Max effort) -- suggested ischemic response in last 1.5 minutes of exercise. Normal pulmonary function on PFTs but poor response to  . DOPPLER ECHOCARDIOGRAPHY  11/20/2010   EF =>55%; LV norm mild aortic scelorosis  . ENDOVASCULAR REPAIR/STENT  GRAFT N/A 05/18/2019   Procedure: ENDOVASCULAR REPAIR/STENT GRAFT;  Surgeon: Algernon Huxley, MD;  Location: [place] CV LAB;  Service: Cardiovascular;  Laterality: N/A;  . ESOPHAGOGASTRODUODENOSCOPY (EGD) WITH PROPOFOL N/A 09/15/2017   Procedure: ESOPHAGOGASTRODUODENOSCOPY (EGD) WITH PROPOFOL;  Surgeon: Lucilla Lame, MD;  Location: [facility] ENDOSCOPY;  Service: Endoscopy;  Laterality: N/A;  . FRACTURE SURGERY Right 03/19/2015   wrist  . HIATAL HERNIA REPAIR    . ILIAC ARTERY STENT  12/15/2005   PTA and direct stenting rgt and lft common iliac arteries  . KYPHOPLASTY N/A 04/05/2019   Procedure: KYPHOPLASTY T12, L3, L4 L5;  Surgeon: Hessie Knows, MD;  Location: [facility] ORS;  Service: Orthopedics;  Laterality: N/A;  . NM GATED MYOVIEW ([place] [facility])  12/2017   Low Risk - no ischemia or infarct.  EF > 65%. no RWMA  . NM MYOVIEW ([place] [facility])  11/11/2010; February 2016   a) TM STRESS----NORMAL PERFUSION ,EF 68%; b) EF 50-55%. Normal LV function. No significant ischemia or infarction.  . OPEN REDUCTION INTERNAL FIXATION (ORIF) DISTAL RADIAL FRACTURE Left 01/13/2019   Procedure: OPEN REDUCTION INTERNAL FIXATION (ORIF) DISTAL  RADIAL FRACTURE - LEFT - SLEEP APNEA;  Surgeon: Hessie Knows, MD;  Location: [facility] ORS;  Service: Orthopedics;  Laterality: Left;  . ORIF ELBOW FRACTURE Right 01/01/2017   Procedure: OPEN REDUCTION INTERNAL FIXATION (ORIF) ELBOW/OLECRANON FRACTURE;  Surgeon: Hessie Knows, MD;  Location: [facility] ORS;  Service: Orthopedics;  Laterality: Right;  . ORIF WRIST FRACTURE Right 03/19/2015   Procedure: OPEN REDUCTION INTERNAL FIXATION (ORIF) WRIST FRACTURE;  Surgeon: Hessie Knows, MD;  Location: [facility] ORS;  Service: Orthopedics;  Laterality: Right;  . THORACIC AORTA - CAROTID ANGIOGRAM  October 2007   Dr. 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 TRANSTHORACIC ECHOCARDIOGRAM  February 2016   [facility]: Normal LV function. Dilated  left atrium.    There were no vitals filed for this visit.  Subjective Assessment - 08/23/19 0948    Subjective  Patient reports no aches or pains. No stumbles since last session. Reports doing well with his exercises .Wasn't able to walk on Sunday due to the rain.    Pertinent History  Patient referred to physical therapy for balance/strength issues s/p abdominal aortic aneurysm (AAA) without rupture repair on 05/18/19 .  Patient has been seen by this therapist in the past (last seen in march of 2020) Patient had a fall on 01/08/19, MRI from [facility] dated 03/28/19 report acute compression fractures at T12, L3 and L4 as well as facet arthropathy at the lower lumbar levels and at L3-4 moderate central stenosis. Patient underwent injection on 10/6 at L4-5 and L5-S1. PMH includes AAA, arthritis, atherosclerotic heart disease, (s/p CABG), benign neoplasm of ascending/descending colon,  COPD, CAD, dementia, dysrhythmia, frequent falls, GERD, hemorrhoid, HOH, HTN, PAF, prostate cancer. Monday through Friday have 3 people come in different times from 730 to 12pm. Uses a walker occasionally now    Currently in Pain?  No/denies            Treatment:  Nustep Lvl 3 RPM > 65 for cardiovascular challenge x 4 minutes   Airex pad: 7" step , modified tandem stance one foot on each surface hold 30 seconds x 2 trials each LE placement  5lb ankle weights: Standing 7" step toe taps 10x each side; BUEW support Standing 7" step lateral toe taps 15x each side; SUE support Seated marching with upright posture 15x  LAQ 10x each side cueing for control of eccentric portion of ambulation  ambulate >1000 ft with CGA and no AD, cueing for increasing step length , safety negotiating turns/corners. And safety awareness. One near LOB, very fatigued by end of ambulation   10 STS from standard height chair. ; 2 sets, improved use of LE's with hands on knees second set.    GTB abduction 15x    Vitals monitored throughout  session during rest periods to ensure O2 saturation return to functional range prior to initiation of each exercise.      Pt educated throughout session about proper posture and technique with exercises. Improved exercise technique, movement at target joints, use of target muscles after min to mod verbal, visual, tactile cues.   Patient continues to present with excellent motivation througohut physical therapy session. Fatigues with increased repetition number indicating continued need for focus on capacity for functional mobility. The patient would benefit from continued skilled PT intervention to maximize mobility, safety, and functional abilities                    PT Education - 08/23/19 0948    Education provided  Yes    Education Details  exercise technique, body mechanics    Person(s) Educated  Patient    Methods  Explanation;Demonstration;Tactile cues;Verbal cues    Comprehension  Verbalized understanding;Returned demonstration;Verbal cues required;Tactile cues required       PT Short Term Goals - 08/09/19 1745      PT SHORT TERM GOAL #1   Title  Patient will be independent in home exercise program to improve strength/mobility for better functional independence with ADLs.    Baseline  10/16: HEP given 11/17: HEP compliant    Time  4    Period  Weeks    Status  Achieved    Target Date  07/15/19      PT SHORT TERM GOAL #2   Title  Patient will deny any falls over past 4 weeks to demonstrate improved safety awareness at home and work.    Baseline  10/16: one major fall 11/17: no falls    Time  4    Period  Weeks    Status  Achieved    Target Date  07/15/19        PT Long Term Goals - 08/09/19 1745      PT LONG TERM GOAL #1   Title  Patient will increase BLE gross strength to 4+/5 as to improve functional strength for independent gait, increased standing tolerance and increased ADL ability.    Baseline  10/16: R gross 4-/5 with add/ext 3+/5 L grossly 3+/5  with hip add/ext 2+/5 11/17: R 4/5 add/ext; -/5 L 4-/5 hip add/ext 3/5 12/8: R: 4/5 L 4-/5 hip add/ext 3+/5  Time  8    Period  Weeks    Status  Partially Met    Target Date  10/04/19      PT LONG TERM GOAL #2   Title  Patient (> 49 years old) will complete five times sit to stand test in < 15 seconds indicating an increased LE strength and improved balance.    Baseline  10/16: 17 seconds with heavy use of hands on knees 11/17: 16 seconds one near LOB 12/8: 13 seconds one posterior LOB    Time  8    Period  Weeks    Status  Achieved      PT LONG TERM GOAL #3   Title  Patient will increase Berg Balance score by > 6 points (43/56)  to demonstrate decreased fall risk during functional activities.    Baseline  10/16: 37/56 11/17: 42/56 12/8: 47/56    Time  8    Period  Weeks    Status  Achieved      PT LONG TERM GOAL #4   Title  Patient will increase 10 meter walk test to >1.62ms as to improve gait speed for better community ambulation and to reduce fall risk.    Baseline  10/16: 0.91 w/o AD 11/17: 1.43 m/s with no AD    Time  8    Period  Weeks    Status  Achieved      PT LONG TERM GOAL #5   Title  Patient will increase ABC scale score >80% to demonstrate better functional mobility and better confidence with ADLs.    Baseline  10/16; 71% 11/17: 80% 12/8: 83%    Time  8    Period  Weeks    Status  Achieved      Additional Long Term Goals   Additional Long Term Goals  Yes      PT LONG TERM GOAL #6   Title  Patient will improve score on DGI to > or = 19/24 on the DGI to increase dynamic balance during mobility and to decrease risk of falls.    Baseline  12/8: perform next session    Time  8    Period  Weeks    Status  New    Target Date  10/04/19      PT LONG TERM GOAL #7   Title  Patient will increase Berg Balance score by > 6 points (53/56)  to demonstrate decreased fall risk during functional activities.    Baseline  12/8; 47/56    Time  8    Period  Weeks    Status   New    Target Date  10/04/19            Plan - 08/23/19 1010    Clinical Impression Statement  Patient continues to present with excellent motivation througohut physical therapy session. Fatigues with increased repetition number indicating continued need for focus on capacity for functional mobility. The patient would benefit from continued skilled PT intervention to maximize mobility, safety, and functional abilities    Personal Factors and Comorbidities  Age;Comorbidity 3+;Fitness;Past/Current Experience;Sex;Social Background;Time since onset of injury/illness/exacerbation;Transportation    Comorbidities  AAA, arthritis, atherosclerotic heart disease, (s/p CABG), benign neoplasm of ascending/descending colon, COPD, CAD, dementia, dysrhythmia, frequent falls, GERD, hemorrhoid, HOH, HTN, PAF, prostate cancer.    Examination-Activity Limitations  Bathing;Bend;Carry;Dressing;Continence;Stairs;Squat;Reach Overhead;Locomotion Level;Lift;Stand;Transfers;Toileting    Examination-Participation Restrictions  Church;Cleaning;Community Activity;Driving;Interpersonal Relationship;Laundry;Volunteer;Shop;Personal Finances;Meal Prep;Yard Work    REngineer, mining  Impairments Affecting Rehab Potential  (+) good support system, high prior level of function (-) currently undergoing radiation therapy for prostate cancer, memory deficits     PT Frequency  2x / week    PT Duration  8 weeks    PT Treatment/Interventions  ADLs/Self Care Home Management;Aquatic Therapy;Cryotherapy;Moist Heat;Traction;Ultrasound;Therapeutic activities;Functional mobility training;Stair training;Gait training;DME Instruction;Therapeutic exercise;Balance training;Neuromuscular re-education;Patient/family education;Manual techniques;Passive range of motion;Energy conservation;Vestibular;Taping    PT Next Visit Plan  progress dynamic stability    PT Home Exercise Plan  see above    Consulted and Agree with Plan of Care   Patient       Patient will benefit from skilled therapeutic intervention in order to improve the following deficits and impairments:  Abnormal gait, Cardiopulmonary status limiting activity, Decreased activity tolerance, Decreased balance, Decreased knowledge of precautions, Decreased endurance, Decreased coordination, Decreased cognition, Decreased knowledge of use of DME, Decreased mobility, Difficulty walking, Decreased safety awareness, Decreased strength, Impaired flexibility, Impaired perceived functional ability, Impaired sensation, Postural dysfunction, Improper body mechanics, Pain  Visit Diagnosis: Unsteadiness on feet  Muscle weakness (generalized)  Other abnormalities of gait and mobility     Problem List Patient Active Problem List   Diagnosis Date Noted  . Status post abdominal aortic aneurysm (AAA) repair 04/15/2019  . Candidiasis 02/22/2019  . Malignant neoplasm of descending colon ([place])   . Benign neoplasm of descending colon   . Benign neoplasm of transverse colon   . Do not intubate, cardiopulmonary resuscitation (CPR)-only code status 10/08/2018  . Prostate cancer ([place]) 02/25/2018  . Prostatic cyst 01/15/2018  . 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 ([place]) 09/15/2017  . Blood in stool   . Abnormal feces   . Stricture and stenosis of esophagus   . Mild cognitive impairment 08/18/2017  . Senile purpura ([place]) 04/29/2017  . Anemia 04/29/2017  . Frequent falls 04/29/2017  . Acute respiratory failure ([place]) 12/17/2016  . Simple chronic bronchitis ([place]) 12/17/2016  . Benign localized hyperplasia of prostate with urinary obstruction 07/15/2016  . Elevated PSA 07/15/2016  . Incomplete emptying of bladder 07/15/2016  . Urge incontinence 07/15/2016  . Hypothyroidism 04/25/2016  . Macular degeneration 04/25/2016  . Erectile dysfunction due to arterial insufficiency   . Ischemic heart disease with chronotropic incompetence   . CN (constipation) 02/10/2015  . DD  (diverticular disease) 02/10/2015  . Fatigue 02/10/2015  . Lichen planus 83/38/2505  . Restless leg 02/10/2015  . 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 B12 deficiency 02/10/2015  . COPD (chronic obstructive pulmonary disease) ([place]) 11/14/2014  . Post Inflammatory Lung Changes 11/14/2014  . PAF (paroxysmal atrial fibrillation) ([facility]) CHA2DS2-VASc = 4. AC = Eliquis   . Insomnia 12/09/2013  . OSA on CPAP   . Dyspnea on exertion - -essentially resolved with BB dose reduction & wgt loss 03/29/2013  . Overweight (BMI 25.0-29.9) -- Wgt back up 01/17/2013  . Atherosclerotic heart disease of native coronary artery without angina pectoris - s/p CABG, occluded SVG-D1; patent LIMA-LAD, SVG-OM, SVG- RCA   . Essential hypertension   . Hyperlipidemia with target LDL less than 70   . Aorto-iliac disease ([place])   . BCC (basal cell carcinoma), eyelid 01/03/2013  . Allergic rhinitis 08/17/2009  . Adaptation reaction 05/28/2009  . Acid reflux 05/28/2009  . Arthritis, degenerative 05/28/2009  . 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 Carotid artery occlusion without infarction 06/01/2006  . PAD (peripheral artery disease) - bilateral common iliac stents 12/15/2005  . Aortoiliac occlusive disease ([place]) 11/30/2005  . Atherosclerotic heart  disease of artery bypass graft 09/01/1997   Janna Arch, PT, DPT   08/23/2019, 10:27 AM  [place] MAIN [facility] SERVICES [address] [place], [place], [address] Phone: [phone]   Fax:  [phone]  Name: HRIDAAN BOUSE MRN: [id number] Date of Birth: 26-Apr-1934

## 2019-08-25 ENCOUNTER — Other Ambulatory Visit: Payer: Self-pay

## 2019-08-25 ENCOUNTER — Ambulatory Visit: Payer: Medicare Other

## 2019-08-25 DIAGNOSIS — R2681 Unsteadiness on feet: Secondary | ICD-10-CM | POA: Diagnosis not present

## 2019-08-25 DIAGNOSIS — M6281 Muscle weakness (generalized): Secondary | ICD-10-CM

## 2019-08-25 DIAGNOSIS — R2689 Other abnormalities of gait and mobility: Secondary | ICD-10-CM

## 2019-08-25 NOTE — Therapy (Signed)
[place] MAIN [facility] SERVICES [address] [place], [place], [address] Phone: [phone]   Fax:  [phone]  Physical Therapy Treatment  Patient Details  Name: [name] MRN: [id number] Date of Birth: 06-Oct-1933 Referring Provider (PT): Eulogio Ditch   Encounter Date: 08/25/2019  PT End of Session - 08/25/19 0949    Visit Number  19    Number of Visits  30    Date for PT Re-Evaluation  10/04/19    PT Start Time  0944    PT Stop Time  1028    PT Time Calculation (min)  44 min    Equipment Utilized During Treatment  Gait belt    Activity Tolerance  Patient tolerated treatment well    Behavior During Therapy  [facility] for tasks assessed/performed       Past Medical History:  Diagnosis Date  . AAA (abdominal aortic aneurysm) ([place])   . Arthritis   . Atherosclerotic heart disease of native coronary artery without angina pectoris 1998   - s/p CABG, occluded SVG-D1; patent LIMA-LAD, SVG-OM, SVG- RCA  . Basal cell carcinoma of eyelid 01/03/2013   2019 - R side of Nose  . Benign neoplasm of ascending colon   . Benign neoplasm of descending colon   . Cancer [facility]    Skin cancer  . Colon polyps   . COPD (chronic obstructive pulmonary disease) ([place])   . Coronary artery disease   . Dementia ([place])   . Dysrhythmia    atrial fib  . Falls frequently   . GERD (gastroesophageal reflux disease)   . Hemorrhoid 02/10/2015  . History of hiatal hernia   . History of kidney stones   . HOH (hard of hearing)   . Hypertension   . Memory loss   . Osteoporosis   . PAF (paroxysmal atrial fibrillation) ([place])   . Prostate cancer ([place])   . Prostate disorder   . Sciatic pain    Chronic  . Sleep apnea    cpap  . Temporary cerebral vascular dysfunction 02/10/2015   Had negative work-up.  July, 2012.  No medication changes, had forgot them that day, got dehydrated.   . Urinary incontinence     Past Surgical History:  Procedure Laterality Date  .  ABDOMINAL AORTIC ENDOVASCULAR STENT GRAFT Bilateral 05/18/2019   Procedure: ABDOMINAL AORTIC ENDOVASCULAR STENT GRAFT;  Surgeon: Algernon Huxley, MD;  Location: [facility] ORS;  Service: Vascular;  Laterality: Bilateral;  . ANKLE SURGERY    . Arch aortogram and carotid aortogram  06/02/2006   Dr Oneida Alar did surgery  . BASAL CELL CARCINOMA EXCISION  04/2016   Dermatology  . CARDIAC CATHETERIZATION  01/30/1998    (Dr. Linard Millers): Native LAD & mRCA CTO. 90% D1. Mod Cx. SVG-D1 CTO. SVG-RCA & SVG-OM along with LIMA-LAD patent;  LV NORMAL Fxn  . CAROTID ENDARTERECTOMY Left Oct. 29, 2007   Dr. Oneida Alar:  . CATARACT EXTRACTION W/PHACO Right 03/05/2017   Procedure: CATARACT EXTRACTION PHACO AND INTRAOCULAR LENS PLACEMENT (IOC);  Surgeon: Leandrew Koyanagi, MD;  Location: [facility] ORS;  Service: Ophthalmology;  Laterality: Right;  [place] 00:58.5 AP% 10.6 CDE 6.20 Fluid Pack lot # [id number]  . CATARACT EXTRACTION W/PHACO Left 04/15/2017   Procedure: CATARACT EXTRACTION PHACO AND INTRAOCULAR LENS PLACEMENT ([place])  Left;  Surgeon: Leandrew Koyanagi, MD;  Location: [place];  Service: Ophthalmology;  Laterality: Left;  . COLONOSCOPY WITH PROPOFOL N/A 09/15/2017   Procedure: COLONOSCOPY WITH PROPOFOL;  Surgeon: Lucilla Lame,  MD;  Location: [facility] ENDOSCOPY;  Service: Endoscopy;  Laterality: N/A;  . COLONOSCOPY WITH PROPOFOL N/A 10/26/2018   Procedure: COLONOSCOPY WITH PROPOFOL;  Surgeon: Lucilla Lame, MD;  Location: [facility] ENDOSCOPY;  Service: Endoscopy;  Laterality: N/A;  . CORONARY ARTERY BYPASS GRAFT  1998   LIMA-LAD, SVG-OM, SVG-RPDA, SVG-DIAG  . CPET / MET  12/2012   Mild chronotropic incompetence - read 82% of predicted; also reduced effort; peak VO2 15.7 / 75% (did not reach Max effort) -- suggested ischemic response in last 1.5 minutes of exercise. Normal pulmonary function on PFTs but poor response to  . DOPPLER ECHOCARDIOGRAPHY  11/20/2010   EF =>55%; LV norm mild aortic scelorosis  . ENDOVASCULAR REPAIR/STENT  GRAFT N/A 05/18/2019   Procedure: ENDOVASCULAR REPAIR/STENT GRAFT;  Surgeon: Algernon Huxley, MD;  Location: [place] CV LAB;  Service: Cardiovascular;  Laterality: N/A;  . ESOPHAGOGASTRODUODENOSCOPY (EGD) WITH PROPOFOL N/A 09/15/2017   Procedure: ESOPHAGOGASTRODUODENOSCOPY (EGD) WITH PROPOFOL;  Surgeon: Lucilla Lame, MD;  Location: [facility] ENDOSCOPY;  Service: Endoscopy;  Laterality: N/A;  . FRACTURE SURGERY Right 03/19/2015   wrist  . HIATAL HERNIA REPAIR    . ILIAC ARTERY STENT  12/15/2005   PTA and direct stenting rgt and lft common iliac arteries  . KYPHOPLASTY N/A 04/05/2019   Procedure: KYPHOPLASTY T12, L3, L4 L5;  Surgeon: Hessie Knows, MD;  Location: [facility] ORS;  Service: Orthopedics;  Laterality: N/A;  . NM GATED MYOVIEW ([place] [facility]X)  12/2017   Low Risk - no ischemia or infarct.  EF > 65%. no RWMA  . NM MYOVIEW ([place] HX)  11/11/2010; February 2016   a) TM STRESS----NORMAL PERFUSION ,EF 68%; b) EF 50-55%. Normal LV function. No significant ischemia or infarction.  . OPEN REDUCTION INTERNAL FIXATION (ORIF) DISTAL RADIAL FRACTURE Left 01/13/2019   Procedure: OPEN REDUCTION INTERNAL FIXATION (ORIF) DISTAL  RADIAL FRACTURE - LEFT - SLEEP APNEA;  Surgeon: Hessie Knows, MD;  Location: [facility] ORS;  Service: Orthopedics;  Laterality: Left;  . ORIF ELBOW FRACTURE Right 01/01/2017   Procedure: OPEN REDUCTION INTERNAL FIXATION (ORIF) ELBOW/OLECRANON FRACTURE;  Surgeon: Hessie Knows, MD;  Location: [facility] ORS;  Service: Orthopedics;  Laterality: Right;  . ORIF WRIST FRACTURE Right 03/19/2015   Procedure: OPEN REDUCTION INTERNAL FIXATION (ORIF) WRIST FRACTURE;  Surgeon: Hessie Knows, MD;  Location: [facility] ORS;  Service: Orthopedics;  Laterality: Right;  . THORACIC AORTA - CAROTID ANGIOGRAM  October 2007   Dr. 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 TRANSTHORACIC ECHOCARDIOGRAM  February 2016   [facility]: Normal LV function. Dilated  left atrium.    There were no vitals filed for this visit.  Subjective Assessment - 08/25/19 0948    Subjective  Patient reports compliance with HEP, was brought by significant other rather than aide due to it being Christmas eve. No falls or LOB since last session.    Pertinent History  Patient referred to physical therapy for balance/strength issues s/p abdominal aortic aneurysm (AAA) without rupture repair on 05/18/19 .  Patient has been seen by this therapist in the past (last seen in march of 2020) Patient had a fall on 01/08/19, MRI from [facility] dated 03/28/19 report acute compression fractures at T12, L3 and L4 as well as facet arthropathy at the lower lumbar levels and at L3-4 moderate central stenosis. Patient underwent injection on 10/6 at L4-5 and L5-S1. PMH includes AAA, arthritis, atherosclerotic heart disease, (s/p CABG), benign neoplasm of ascending/descending colon, COPD,  CAD, dementia, dysrhythmia, frequent falls, GERD, hemorrhoid, HOH, HTN, PAF, prostate cancer. Monday through Friday have 3 people come in different times from 730 to 12pm. Uses a walker occasionally now    Currently in Pain?  No/denies         Treatment:   Nustep Lvl 3 RPM > 65 for cardiovascular challenge x 4 minutes   Airex pad: 7" step , modified tandem stance one foot on each surface hold 30 seconds x 2 trials each LE placement  Airex pad: 7" step toe taps 20x, SUE support,   Airex pad: 7" step lateral toe taps 15x SUE support  Static stand with 5lb bar rows 10x  Static stand with 5lb bar straight arm raises 10x; one episode of posterior LOB  Static stand with 5lb bar curls 10x  4 square step with PT cueing for leg and square to tap, more challenging with posterior taps. X 2 minutes    Quantum leg press: bilateral LE: #135 12x, cueing for velocity and control x 2 sets  Quantum leg press: single LE: #60, 12x each LE, cueing for body mechanics and decreasing velocity for improved muscle contraction 2 sets  each LE   10 STS from standard height chair. ; 2 sets, improved use of LE's with hands on knees second set.   Seated adduction with LAQ squeezing rainbow ball between feet 12  Seated adduction ball squeezes 15x 3 second holds    GTB abduction 15x    GTB marches in chair 15x with upright posture  Vitals monitored throughout session during rest periods to ensure O2 saturation return to functional range prior to initiation of each exercise.      Pt educated throughout session about proper posture and technique with exercises. Improved exercise technique, movement at target joints, use of target muscles after min to mod verbal, visual, tactile cues.   Patient continues to progress with functional strength and stability with good motivation. Continues to require rest breaks due to fatigue after prolonged muscle recruitment interventions. Decreased reliance on UE support with dynamic surfaces noted with SUE support toe taps on airex pad performed. The patient would benefit from continued skilled PT intervention to maximize mobility, safety, and functional abilities                       PT Education - 08/25/19 0948    Education provided  Yes    Education Details  exercise technique, body mechanics    Person(s) Educated  Patient    Methods  Explanation;Demonstration;Tactile cues;Verbal cues    Comprehension  Verbalized understanding;Returned demonstration;Verbal cues required;Tactile cues required       PT Short Term Goals - 08/09/19 1745      PT SHORT TERM GOAL #1   Title  Patient will be independent in home exercise program to improve strength/mobility for better functional independence with ADLs.    Baseline  10/16: HEP given 11/17: HEP compliant    Time  4    Period  Weeks    Status  Achieved    Target Date  07/15/19      PT SHORT TERM GOAL #2   Title  Patient will deny any falls over past 4 weeks to demonstrate improved safety awareness at home and work.     Baseline  10/16: one major fall 11/17: no falls    Time  4    Period  Weeks    Status  Achieved    Target Date  07/15/19        PT Long Term Goals - 08/09/19 1745      PT LONG TERM GOAL #1   Title  Patient will increase BLE gross strength to 4+/5 as to improve functional strength for independent gait, increased standing tolerance and increased ADL ability.    Baseline  10/16: R gross 4-/5 with add/ext 3+/5 L grossly 3+/5 with hip add/ext 2+/5 11/17: R 4/5 add/ext; -/5 L 4-/5 hip add/ext 3/5 12/8: R: 4/5 L 4-/5 hip add/ext 3+/5    Time  8    Period  Weeks    Status  Partially Met    Target Date  10/04/19      PT LONG TERM GOAL #2   Title  Patient (> 41 years old) will complete five times sit to stand test in < 15 seconds indicating an increased LE strength and improved balance.    Baseline  10/16: 17 seconds with heavy use of hands on knees 11/17: 16 seconds one near LOB 12/8: 13 seconds one posterior LOB    Time  8    Period  Weeks    Status  Achieved      PT LONG TERM GOAL #3   Title  Patient will increase Berg Balance score by > 6 points (43/56)  to demonstrate decreased fall risk during functional activities.    Baseline  10/16: 37/56 11/17: 42/56 12/8: 47/56    Time  8    Period  Weeks    Status  Achieved      PT LONG TERM GOAL #4   Title  Patient will increase 10 meter walk test to >1.105ms as to improve gait speed for better community ambulation and to reduce fall risk.    Baseline  10/16: 0.91 w/o AD 11/17: 1.43 m/s with no AD    Time  8    Period  Weeks    Status  Achieved      PT LONG TERM GOAL #5   Title  Patient will increase ABC scale score >80% to demonstrate better functional mobility and better confidence with ADLs.    Baseline  10/16; 71% 11/17: 80% 12/8: 83%    Time  8    Period  Weeks    Status  Achieved      Additional Long Term Goals   Additional Long Term Goals  Yes      PT LONG TERM GOAL #6   Title  Patient will improve score on DGI to > or =  19/24 on the DGI to increase dynamic balance during mobility and to decrease risk of falls.    Baseline  12/8: perform next session    Time  8    Period  Weeks    Status  New    Target Date  10/04/19      PT LONG TERM GOAL #7   Title  Patient will increase Berg Balance score by > 6 points (53/56)  to demonstrate decreased fall risk during functional activities.    Baseline  12/8; 47/56    Time  8    Period  Weeks    Status  New    Target Date  10/04/19            Plan - 08/25/19 1016    Clinical Impression Statement  Patient continues to progress with functional strength and stability with good motivation. Continues to require rest breaks due to fatigue after prolonged muscle recruitment interventions. Decreased reliance on  UE support with dynamic surfaces noted with SUE support toe taps on airex pad performed. The patient would benefit from continued skilled PT intervention to maximize mobility, safety, and functional abilities    Personal Factors and Comorbidities  Age;Comorbidity 3+;Fitness;Past/Current Experience;Sex;Social Background;Time since onset of injury/illness/exacerbation;Transportation    Comorbidities  AAA, arthritis, atherosclerotic heart disease, (s/p CABG), benign neoplasm of ascending/descending colon, COPD, CAD, dementia, dysrhythmia, frequent falls, GERD, hemorrhoid, HOH, HTN, PAF, prostate cancer.    Examination-Activity Limitations  Bathing;Bend;Carry;Dressing;Continence;Stairs;Squat;Reach Overhead;Locomotion Level;Lift;Stand;Transfers;Toileting    Examination-Participation Restrictions  Church;Cleaning;Community Activity;Driving;Interpersonal Relationship;Laundry;Volunteer;Shop;Personal Finances;Meal Prep;Yard Work    [employer] Potential  Fair    Clinical Impairments Affecting Rehab Potential  (+) good support system, high prior level of function (-) currently undergoing radiation therapy for prostate cancer, memory deficits     PT Frequency  2x / week    PT  Duration  8 weeks    PT Treatment/Interventions  ADLs/Self Care Home Management;Aquatic Therapy;Cryotherapy;Moist Heat;Traction;Ultrasound;Therapeutic activities;Functional mobility training;Stair training;Gait training;DME Instruction;Therapeutic exercise;Balance training;Neuromuscular re-education;Patient/family education;Manual techniques;Passive range of motion;Energy conservation;Vestibular;Taping    PT Next Visit Plan  progress dynamic stability    PT Home Exercise Plan  see above    Consulted and Agree with Plan of Care  Patient       Patient will benefit from skilled therapeutic intervention in order to improve the following deficits and impairments:  Abnormal gait, Cardiopulmonary status limiting activity, Decreased activity tolerance, Decreased balance, Decreased knowledge of precautions, Decreased endurance, Decreased coordination, Decreased cognition, Decreased knowledge of use of DME, Decreased mobility, Difficulty walking, Decreased safety awareness, Decreased strength, Impaired flexibility, Impaired perceived functional ability, Impaired sensation, Postural dysfunction, Improper body mechanics, Pain  Visit Diagnosis: Unsteadiness on feet  Muscle weakness (generalized)  Other abnormalities of gait and mobility     Problem List Patient Active Problem List   Diagnosis Date Noted  . Status post abdominal aortic aneurysm (AAA) repair 04/15/2019  . Candidiasis 02/22/2019  . Malignant neoplasm of descending colon ([place])   . Benign neoplasm of descending colon   . Benign neoplasm of transverse colon   . Do not intubate, cardiopulmonary resuscitation (CPR)-only code status 10/08/2018  . Prostate cancer ([place]) 02/25/2018  . Prostatic cyst 01/15/2018  . 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 ([place]) 09/15/2017  . Blood in stool   . Abnormal feces   . Stricture and stenosis of esophagus   . Mild cognitive impairment 08/18/2017  . Senile purpura ([place]) 04/29/2017  . Anemia 04/29/2017  .  Frequent falls 04/29/2017  . Acute respiratory failure ([place]) 12/17/2016  . Simple chronic bronchitis ([place]) 12/17/2016  . Benign localized hyperplasia of prostate with urinary obstruction 07/15/2016  . Elevated PSA 07/15/2016  . Incomplete emptying of bladder 07/15/2016  . Urge incontinence 07/15/2016  . Hypothyroidism 04/25/2016  . Macular degeneration 04/25/2016  . Erectile dysfunction due to arterial insufficiency   . Ischemic heart disease with chronotropic incompetence   . CN (constipation) 02/10/2015  . DD (diverticular disease) 02/10/2015  . Fatigue 02/10/2015  . Lichen planus 04/88/8916  . Restless leg 02/10/2015  . 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 B12 deficiency 02/10/2015  . COPD (chronic obstructive pulmonary disease) ([place]) 11/14/2014  . Post Inflammatory Lung Changes 11/14/2014  . PAF (paroxysmal atrial fibrillation) ([facility]) CHA2DS2-VASc = 4. AC = Eliquis   . Insomnia 12/09/2013  . OSA on CPAP   . Dyspnea on exertion - -essentially resolved with BB dose reduction & wgt loss 03/29/2013  . Overweight (BMI 25.0-29.9) -- Wgt back up 01/17/2013  .  Atherosclerotic heart disease of native coronary artery without angina pectoris - s/p CABG, occluded SVG-D1; patent LIMA-LAD, SVG-OM, SVG- RCA   . Essential hypertension   . Hyperlipidemia with target LDL less than 70   . Aorto-iliac disease ([place])   . BCC (basal cell carcinoma), eyelid 01/03/2013  . Allergic rhinitis 08/17/2009  . Adaptation reaction 05/28/2009  . Acid reflux 05/28/2009  . Arthritis, degenerative 05/28/2009  . 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 Carotid artery occlusion without infarction 06/01/2006  . PAD (peripheral artery disease) - bilateral common iliac stents 12/15/2005  . Aortoiliac occlusive disease ([place]) 11/30/2005  . Atherosclerotic heart disease of artery bypass graft 09/01/1997   Janna Arch, PT, DPT   08/25/2019, 10:29 AM  [place] MA[facility]  [facility] SERVICES [address] [place], [place], [address] Phone: [phone]   Fax:  [phone]  Name: [name] MRN: [id number] Date of Birth: 26-Jul-1934

## 2019-08-30 ENCOUNTER — Other Ambulatory Visit: Payer: Self-pay

## 2019-08-30 ENCOUNTER — Ambulatory Visit: Payer: Medicare Other

## 2019-08-30 DIAGNOSIS — R262 Difficulty in walking, not elsewhere classified: Secondary | ICD-10-CM

## 2019-08-30 DIAGNOSIS — M6281 Muscle weakness (generalized): Secondary | ICD-10-CM

## 2019-08-30 DIAGNOSIS — R2681 Unsteadiness on feet: Secondary | ICD-10-CM

## 2019-08-30 DIAGNOSIS — R2689 Other abnormalities of gait and mobility: Secondary | ICD-10-CM

## 2019-08-30 NOTE — Therapy (Signed)
[place] [facility] SERVICES [address] [place], [place], [address] Phone: [phone]   Fax:  [phone]  Physical Therapy Treatment Physical Therapy Progress Note   Dates of reporting period  07/19/19   to   08/30/19   Patient Details  Name: [name] MRN: [id number] Date of Birth: 09-23-1933 Referring Provider (PT): Eulogio Ditch   Encounter Date: 08/30/2019  PT End of Session - 08/30/19 0942    Visit Number  20    Number of Visits  30    Date for PT Re-Evaluation  10/04/19    Authorization Type  next session 1/10 PN 08/30/19    PT Start Time  0945    PT Stop Time  1029    PT Time Calculation (min)  44 min    Equipment Utilized During Treatment  Gait belt    Activity Tolerance  Patient tolerated treatment well    Behavior During Therapy  [facility] for tasks assessed/performed       Past Medical History:  Diagnosis Date  . AAA (abdominal aortic aneurysm) ([place])   . Arthritis   . Atherosclerotic heart disease of native coronary artery without angina pectoris 1998   - s/p CABG, occluded SVG-D1; patent LIMA-LAD, SVG-OM, SVG- RCA  . Basal cell carcinoma of eyelid 01/03/2013   2019 - R side of Nose  . Benign neoplasm of ascending colon   . Benign neoplasm of descending colon   . Cancer [facility])    Skin cancer  . Colon polyps   . COPD (chronic obstructive pulmonary disease) ([place])   . Coronary artery disease   . Dementia ([place])   . Dysrhythmia    atrial fib  . Falls frequently   . GERD (gastroesophageal reflux disease)   . Hemorrhoid 02/10/2015  . History of hiatal hernia   . History of kidney stones   . HOH (hard of hearing)   . Hypertension   . Memory loss   . Osteoporosis   . PAF (paroxysmal atrial fibrillation) ([place])   . Prostate cancer ([place])   . Prostate disorder   . Sciatic pain    Chronic  . Sleep apnea    cpap  . Temporary cerebral vascular dysfunction 02/10/2015   Had negative work-up.  July, 2012.  No  medication changes, had forgot them that day, got dehydrated.   . Urinary incontinence     Past Surgical History:  Procedure Laterality Date  . ABDOMINAL AORTIC ENDOVASCULAR STENT GRAFT Bilateral 05/18/2019   Procedure: ABDOMINAL AORTIC ENDOVASCULAR STENT GRAFT;  Surgeon: Algernon Huxley, MD;  Location: [facility] ORS;  Service: Vascular;  Laterality: Bilateral;  . ANKLE SURGERY    . Arch aortogram and carotid aortogram  06/02/2006   Dr Oneida Alar did surgery  . BASAL CELL CARCINOMA EXCISION  04/2016   Dermatology  . CARDIAC CATHETERIZATION  01/30/1998    (Dr. Linard Millers): Native LAD & mRCA CTO. 90% D1. Mod Cx. SVG-D1 CTO. SVG-RCA & SVG-OM along with LIMA-LAD patent;  LV NORMAL Fxn  . CAROTID ENDARTERECTOMY Left Oct. 29, 2007   Dr. Oneida Alar:  . CATARACT EXTRACTION W/PHACO Right 03/05/2017   Procedure: CATARACT EXTRACTION PHACO AND INTRAOCULAR LENS PLACEMENT (IOC);  Surgeon: Leandrew Koyanagi, MD;  Location: [facility] ORS;  Service: Ophthalmology;  Laterality: Right;  [place] 00:58.5 AP% 10.6 CDE 6.20 Fluid Pack lot # [id number]  . CATARACT EXTRACTION W/PHACO Left 04/15/2017   Procedure: CATARACT EXTRACTION PHACO AND INTRAOCULAR LENS PLACEMENT ([place])  Left;  Surgeon: Wallace Going,  Nila Nephew, MD;  Location: [place];  Service: Ophthalmology;  Laterality: Left;  . COLONOSCOPY WITH PROPOFOL N/A 09/15/2017   Procedure: COLONOSCOPY WITH PROPOFOL;  Surgeon: Lucilla Lame, MD;  Location[facility] ENDOSCOPY;  Service: Endoscopy;  Laterality: N/A;  . COLONOSCOPY WITH PROPOFOL N/A 10/26/2018   Procedure: COLONOSCOPY WITH PROPOFOL;  Surgeon: Lucilla Lame, MD;  Location[facility] ENDOSCOPY;  Service: Endoscopy;  Laterality: N/A;  . CORONARY ARTERY BYPASS GRAFT  1998   LIMA-LAD, SVG-OM, SVG-RPDA, SVG-DIAG  . CPET / MET  12/2012   Mild chronotropic incompetence - read 82% of predicted; also reduced effort; peak VO2 15.7 / 75% (did not reach Max effort) -- suggested ischemic response in last 1.5 minutes of exercise. Normal pulmonary  function on PFTs but poor response to  . DOPPLER ECHOCARDIOGRAPHY  11/20/2010   EF =>55%; LV norm mild aortic scelorosis  . ENDOVASCULAR REPAIR/STENT GRAFT N/A 05/18/2019   Procedure: ENDOVASCULAR REPAIR/STENT GRAFT;  Surgeon: Algernon Huxley, MD;  Location: [place] CV LAB;  Service: Cardiovascular;  Laterality: N/A;  . ESOPHAGOGASTRODUODENOSCOPY (EGD) WITH PROPOFOL N/A 09/15/2017   Procedure: ESOPHAGOGASTRODUODENOSCOPY (EGD) WITH PROPOFOL;  Surgeon: Lucilla Lame, MD;  Location: [facility] ENDOSCOPY;  Service: Endoscopy;  Laterality: N/A;  . FRACTURE SURGERY Right 03/19/2015   wrist  . HIATAL HERNIA REPAIR    . ILIAC ARTERY STENT  12/15/2005   PTA and direct stenting rgt and lft common iliac arteries  . KYPHOPLASTY N/A 04/05/2019   Procedure: KYPHOPLASTY T12, L3, L4 L5;  Surgeon: Hessie Knows, MD;  Location: [facility] ORS;  Service: Orthopedics;  Laterality: N/A;  . NM GATED MYOVIEW ([place] HX)  12/2017   Low Risk - no ischemia or infarct.  EF > 65%. no RWMA  . NM MYOVIEW ([place] HX)  11/11/2010; February 2016   a) TM STRESS----NORMAL PERFUSION ,EF 68%; b) EF 50-55%. Normal LV function. No significant ischemia or infarction.  . OPEN REDUCTION INTERNAL FIXATION (ORIF) DISTAL RADIAL FRACTURE Left 01/13/2019   Procedure: OPEN REDUCTION INTERNAL FIXATION (ORIF) DISTAL  RADIAL FRACTURE - LEFT - SLEEP APNEA;  Surgeon: Hessie Knows, MD;  Location: [facility] ORS;  Service: Orthopedics;  Laterality: Left;  . ORIF ELBOW FRACTURE Right 01/01/2017   Procedure: OPEN REDUCTION INTERNAL FIXATION (ORIF) ELBOW/OLECRANON FRACTURE;  Surgeon: Hessie Knows, MD;  Location: [facility] ORS;  Service: Orthopedics;  Laterality: Right;  . ORIF WRIST FRACTURE Right 03/19/2015   Procedure: OPEN REDUCTION INTERNAL FIXATION (ORIF) WRIST FRACTURE;  Surgeon: Hessie Knows, MD;  Location: [facility] ORS;  Service: Orthopedics;  Laterality: Right;  . THORACIC AORTA - CAROTID ANGIOGRAM  October 2007   Dr. 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 TRANSTHORACIC ECHOCARDIOGRAM  February 2016   [facility]: Normal LV function. Dilated left atrium.    There were no vitals filed for this visit.  Subjective Assessment - 08/30/19 1010    Subjective  Patient reports he is going to the doctor for his back this afternoon. Patient reports no falls or LOB since last session.    Pertinent History  Patient referred to physical therapy for balance/strength issues s/p abdominal aortic aneurysm (AAA) without rupture repair on 05/18/19 .  Patient has been seen by this therapist in the past (last seen in march of 2020) Patient had a fall on 01/08/19, MRI from [facility] dated 03/28/19 report acute compression fractures at T12, L3 and L4 as well as facet arthropathy at the lower lumbar levels and at L3-4 moderate  central stenosis. Patient underwent injection on 10/6 at L4-5 and L5-S1. PMH includes AAA, arthritis, atherosclerotic heart disease, (s/p CABG), benign neoplasm of ascending/descending colon, COPD, CAD, dementia, dysrhythmia, frequent falls, GERD, hemorrhoid, HOH, HTN, PAF, prostate cancer. Monday through Friday have 3 people come in different times from 730 to 12pm. Uses a walker occasionally now    Currently in Pain?  No/denies         [facility] PT Assessment - 08/30/19 0001      Standardized Balance Assessment   Standardized Balance Assessment  Berg Balance Test;Dynamic Gait Index      Berg Balance Test   Sit to Stand  Able to stand without using hands and stabilize independently    Standing Unsupported  Able to stand safely 2 minutes    Sitting with Back Unsupported but Feet Supported on Floor or Stool  Able to sit safely and securely 2 minutes    Stand to Sit  Sits safely with minimal use of hands    Transfers  Able to transfer safely, minor use of hands    Standing Unsupported with Eyes Closed  Able to stand 10 seconds with supervision    Standing Unsupported with Feet Together   Able to place feet together independently and stand 1 minute safely    From Standing, Reach Forward with Outstretched Arm  Can reach forward >12 cm safely (5")    From Standing Position, Pick up Object from Floor  Able to pick up shoe safely and easily    From Standing Position, Turn to Look Behind Over each Shoulder  Looks behind from both sides and weight shifts well    Turn 360 Degrees  Able to turn 360 degrees safely one side only in 4 seconds or less    Standing Unsupported, Alternately Place Feet on Step/Stool  Able to stand independently and safely and complete 8 steps in 20 seconds    Standing Unsupported, One Foot in Front  Able to plae foot ahead of the other independently and hold 30 seconds    Standing on One Leg  Tries to lift leg/unable to hold 3 seconds but remains standing independently    Total Score  49      Dynamic Gait Index   Level Surface  Normal    Change in Gait Speed  Normal    Gait with Horizontal Head Turns  Mild Impairment    Gait with Vertical Head Turns  Mild Impairment    Gait and Pivot Turn  Moderate Impairment    Step Over Obstacle  Mild Impairment    Step Around Obstacles  Normal    Steps  Mild Impairment    Total Score  18         Progress note DGI: 18/24 BERG: 49/56  MMT:    Treatment:   RTB standing hip extension 15x each LE cueing for knee extension BUE support  RTB hip abduction standing with BUE support 12x each LE   Airex pad: 7" step , modified tandem stance one foot on each surface hold 30 seconds x 2 trials each LE placement   Airex pad: 7" step toe taps 20x, SUE support,    Airex pad: 7" step lateral toe taps 15x SUE support   Ambulate in hallway with horizontal ball twists 160 ft; CGA cueing for stability/safety awareness   Ambulate in hallway with vertical ball raises 160 ft CGA cueing for stability/safety awareness   10 STS from standard height chair. ; 2 sets, improved  use of LE's with hands on knees second set.       Seated adduction ball squeezes 15x 3 second holds   GTB abduction 15x    GTB marches in chair 15x with upright posture   Vitals monitored throughout session during rest periods to ensure O2 saturation return to functional range prior to initiation of each exercise.      Pt educated throughout session about proper posture and technique with exercises. Improved exercise technique, movement at target joints, use of target muscles after min to mod verbal, visual, tactile cues.        Patient's condition has the potential to improve in response to therapy. Maximum improvement is yet to be obtained. The anticipated improvement is attainable and reasonable in a generally predictable time.  Patient reports he is able to walk more and is feeling more steady.   Patient presents with progression of strength and balance goals. Patient requires seated rest breaks due to onset of back pain with prolonged standing, back pain relieves within a minute of siting and patient is able to return to standing to perform additional tasks.               PT Education - 08/30/19 0941    Education provided  Yes    Education Details  exercise technique, body mechanics    Person(s) Educated  Patient    Methods  Explanation;Demonstration;Tactile cues;Verbal cues    Comprehension  Verbalized understanding;Returned demonstration;Verbal cues required;Tactile cues required       PT Short Term Goals - 08/09/19 1745      PT SHORT TERM GOAL #1   Title  Patient will be independent in home exercise program to improve strength/mobility for better functional independence with ADLs.    Baseline  10/16: HEP given 11/17: HEP compliant    Time  4    Period  Weeks    Status  Achieved    Target Date  07/15/19      PT SHORT TERM GOAL #2   Title  Patient will deny any falls over past 4 weeks to demonstrate improved safety awareness at home and work.    Baseline  10/16: one major fall 11/17: no falls    Time  4     Period  Weeks    Status  Achieved    Target Date  07/15/19        PT Long Term Goals - 08/30/19 1011      PT LONG TERM GOAL #1   Title  Patient will increase BLE gross strength to 4+/5 as to improve functional strength for independent gait, increased standing tolerance and increased ADL ability.    Baseline  10/16: R gross 4-/5 with add/ext 3+/5 L grossly 3+/5 with hip add/ext 2+/5 11/17: R 4/5 add/ext; -/5 L 4-/5 hip add/ext 3/5 12/8: R: 4/5 L 4-/5 hip add/ext 3+/5    Time  8    Period  Weeks    Status  Partially Met    Target Date  10/04/19      PT LONG TERM GOAL #2   Title  Patient (> 56 years old) will complete five times sit to stand test in < 15 seconds indicating an increased LE strength and improved balance.    Baseline  10/16: 17 seconds with heavy use of hands on knees 11/17: 16 seconds one near LOB 12/8: 13 seconds one posterior LOB    Time  8    Period  Weeks  Status  Achieved      PT LONG TERM GOAL #3   Title  Patient will increase Berg Balance score by > 6 points (43/56)  to demonstrate decreased fall risk during functional activities.    Baseline  10/16: 37/56 11/17: 42/56 12/8: 47/56    Time  8    Period  Weeks    Status  Achieved      PT LONG TERM GOAL #4   Title  Patient will increase 10 meter walk test to >1.35ms as to improve gait speed for better community ambulation and to reduce fall risk.    Baseline  10/16: 0.91 w/o AD 11/17: 1.43 m/s with no AD    Time  8    Period  Weeks    Status  Achieved      PT LONG TERM GOAL #5   Title  Patient will increase ABC scale score >80% to demonstrate better functional mobility and better confidence with ADLs.    Baseline  10/16; 71% 11/17: 80% 12/8: 83%    Time  8    Period  Weeks    Status  Achieved      PT LONG TERM GOAL #6   Title  Patient will improve score on DGI to > or = 19/24 on the DGI to increase dynamic balance during mobility and to decrease risk of falls.    Baseline  12/8: perform next session  12/29: 18/24    Time  8    Period  Weeks    Status  Partially Met    Target Date  10/04/19      PT LONG TERM GOAL #7   Title  Patient will increase Berg Balance score by > 6 points (53/56)  to demonstrate decreased fall risk during functional activities.    Baseline  12/8; 47/56 12/29: 49/56    Time  8    Period  Weeks    Status  Partially Met    Target Date  10/04/19              Patient will benefit from skilled therapeutic intervention in order to improve the following deficits and impairments:     Visit Diagnosis: Unsteadiness on feet  Muscle weakness (generalized)  Other abnormalities of gait and mobility  Difficulty in walking, not elsewhere classified     Problem List Patient Active Problem List   Diagnosis Date Noted  . Status post abdominal aortic aneurysm (AAA) repair 04/15/2019  . Candidiasis 02/22/2019  . Malignant neoplasm of descending colon ([place]   . Benign neoplasm of descending colon   . Benign neoplasm of transverse colon   . Do not intubate, cardiopulmonary resuscitation (CPR)-only code status 10/08/2018  . Prostate cancer ([place] 02/25/2018  . Prostatic cyst 01/15/2018  . 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 ([place] 09/15/2017  . Blood in stool   . Abnormal feces   . Stricture and stenosis of esophagus   . Mild cognitive impairment 08/18/2017  . Senile purpura ([place] 04/29/2017  . Anemia 04/29/2017  . Frequent falls 04/29/2017  . Acute respiratory failure ([place] 12/17/2016  . Simple chronic bronchitis ([place] 12/17/2016  . Benign localized hyperplasia of prostate with urinary obstruction 07/15/2016  . Elevated PSA 07/15/2016  . Incomplete emptying of bladder 07/15/2016  . Urge incontinence 07/15/2016  . Hypothyroidism 04/25/2016  . Macular degeneration 04/25/2016  . Erectile dysfunction due to arterial insufficiency   . Ischemic heart disease with chronotropic incompetence   . CN (constipation) 02/10/2015  . DD (  diverticular disease)  02/10/2015  . Fatigue 02/10/2015  . Lichen planus 57/32/2025  . Restless leg 02/10/2015  . 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 B12 deficiency 02/10/2015  . COPD (chronic obstructive pulmonary disease) ([place]) 11/14/2014  . Post Inflammatory Lung Changes 11/14/2014  . PAF (paroxysmal atrial fibrillation) ([facility]) CHA2DS2-VASc = 4. AC = Eliquis   . Insomnia 12/09/2013  . OSA on CPAP   . Dyspnea on exertion - -essentially resolved with BB dose reduction & wgt loss 03/29/2013  . Overweight (BMI 25.0-29.9) -- Wgt back up 01/17/2013  . Atherosclerotic heart disease of native coronary artery without angina pectoris - s/p CABG, occluded SVG-D1; patent LIMA-LAD, SVG-OM, SVG- RCA   . Essential hypertension   . Hyperlipidemia with target LDL less than 70   . Aorto-iliac disease ([place])   . BCC (basal cell carcinoma), eyelid 01/03/2013  . Allergic rhinitis 08/17/2009  . Adaptation reaction 05/28/2009  . Acid reflux 05/28/2009  . Arthritis, degenerative 05/28/2009  . 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 Carotid artery occlusion without infarction 06/01/2006  . PAD (peripheral artery disease) - bilateral common iliac stents 12/15/2005  . Aortoiliac occlusive disease ([place]) 11/30/2005  . Atherosclerotic heart disease of artery bypass graft 09/01/1997   Janna Arch, PT, DPT   08/30/2019, 10:29 AM  [place] [facility] SERVICES [address] [place], [place], [address] Phone: [phone]   Fax:  [phone]  Name: [name] MRN: [id number] Date of Birth: 08/22/1934

## 2019-09-01 ENCOUNTER — Ambulatory Visit: Payer: Medicare Other

## 2019-09-01 ENCOUNTER — Other Ambulatory Visit: Payer: Self-pay

## 2019-09-01 DIAGNOSIS — R2681 Unsteadiness on feet: Secondary | ICD-10-CM

## 2019-09-01 DIAGNOSIS — R2689 Other abnormalities of gait and mobility: Secondary | ICD-10-CM

## 2019-09-01 DIAGNOSIS — M6281 Muscle weakness (generalized): Secondary | ICD-10-CM

## 2019-09-01 NOTE — Therapy (Signed)
[place] MAIN [facility] SERVICES [address] [place], [place], [address] Phone: [phone]   Fax:  [phone]  Physical Therapy Treatment  Patient Details  Name: [name] MRN: [id number] Date of Birth: [birth year] Referring Provider (PT): Eulogio Ditch   Encounter Date: 09/01/2019  PT End of Session - 09/01/19 0948    Visit Number  21    Number of Visits  30    Date for PT Re-Evaluation  10/04/19    Authorization Type  next session 1/10 PN 08/30/19    PT Start Time  0945    PT Stop Time  1028    PT Time Calculation (min)  43 min    Equipment Utilized During Treatment  Gait belt    Activity Tolerance  Patient tolerated treatment well    Behavior During Therapy  [facility] for tasks assessed/performed       Past Medical History:  Diagnosis Date  . AAA (abdominal aortic aneurysm) ([place])   . Arthritis   . Atherosclerotic heart disease of native coronary artery without angina pectoris 1998   - s/p CABG, occluded SVG-D1; patent LIMA-LAD, SVG-OM, SVG- RCA  . Basal cell carcinoma of eyelid 01/03/2013   2019 - R side of Nose  . Benign neoplasm of ascending colon   . Benign neoplasm of descending colon   . Cancer [facility]    Skin cancer  . Colon polyps   . COPD (chronic obstructive pulmonary disease) ([place])   . Coronary artery disease   . Dementia ([place])   . Dysrhythmia    atrial fib  . Falls frequently   . GERD (gastroesophageal reflux disease)   . Hemorrhoid 02/10/2015  . History of hiatal hernia   . History of kidney stones   . HOH (hard of hearing)   . Hypertension   . Memory loss   . Osteoporosis   . PAF (paroxysmal atrial fibrillation) ([place])   . Prostate cancer ([place])   . Prostate disorder   . Sciatic pain    Chronic  . Sleep apnea    cpap  . Temporary cerebral vascular dysfunction 02/10/2015   Had negative work-up.  July, 2012.  No medication changes, had forgot them that day, got dehydrated.   . Urinary incontinence      Past Surgical History:  Procedure Laterality Date  . ABDOMINAL AORTIC ENDOVASCULAR STENT GRAFT Bilateral 05/18/2019   Procedure: ABDOMINAL AORTIC ENDOVASCULAR STENT GRAFT;  Surgeon: Algernon Huxley, MD;  Location: [facility] ORS;  Service: Vascular;  Laterality: Bilateral;  . ANKLE SURGERY    . Arch aortogram and carotid aortogram  06/02/2006   Dr Oneida Alar did surgery  . BASAL CELL CARCINOMA EXCISION  04/2016   Dermatology  . CARDIAC CATHETERIZATION  01/30/1998    (Dr. Linard Millers): Native LAD & mRCA CTO. 90% D1. Mod Cx. SVG-D1 CTO. SVG-RCA & SVG-OM along with LIMA-LAD patent;  LV NORMAL Fxn  . CAROTID ENDARTERECTOMY Left Oct. 29, 2007   Dr. Oneida Alar:  . CATARACT EXTRACTION W/PHACO Right 03/05/2017   Procedure: CATARACT EXTRACTION PHACO AND INTRAOCULAR LENS PLACEMENT (IOC);  Surgeon: Leandrew Koyanagi, MD;  Location: [facility] ORS;  Service: Ophthalmology;  Laterality: Right;  [place] 00:58.5 AP% 10.6 CDE 6.20 Fluid Pack lot # [id number]  . CATARACT EXTRACTION W/PHACO Left 04/15/2017   Procedure: CATARACT EXTRACTION PHACO AND INTRAOCULAR LENS PLACEMENT ([place])  Left;  Surgeon: Leandrew Koyanagi, MD;  Location: [place];  Service: Ophthalmology;  Laterality: Left;  . COLONOSCOPY WITH PROPOFOL N/A  09/15/2017   Procedure: COLONOSCOPY WITH PROPOFOL;  Surgeon: Lucilla Lame, MD;  Location: [facility] ENDOSCOPY;  Service: Endoscopy;  Laterality: N/A;  . COLONOSCOPY WITH PROPOFOL N/A 10/26/2018   Procedure: COLONOSCOPY WITH PROPOFOL;  Surgeon: Lucilla Lame, MD;  Location: [facility] ENDOSCOPY;  Service: Endoscopy;  Laterality: N/A;  . CORONARY ARTERY BYPASS GRAFT  1998   LIMA-LAD, SVG-OM, SVG-RPDA, SVG-DIAG  . CPET / MET  12/2012   Mild chronotropic incompetence - read 82% of predicted; also reduced effort; peak VO2 15.7 / 75% (did not reach Max effort) -- suggested ischemic response in last 1.5 minutes of exercise. Normal pulmonary function on PFTs but poor response to  . DOPPLER ECHOCARDIOGRAPHY  11/20/2010   EF =>55%;  LV norm mild aortic scelorosis  . ENDOVASCULAR REPAIR/STENT GRAFT N/A 05/18/2019   Procedure: ENDOVASCULAR REPAIR/STENT GRAFT;  Surgeon: Algernon Huxley, MD;  Location: [place] CV LAB;  Service: Cardiovascular;  Laterality: N/A;  . ESOPHAGOGASTRODUODENOSCOPY (EGD) WITH PROPOFOL N/A 09/15/2017   Procedure: ESOPHAGOGASTRODUODENOSCOPY (EGD) WITH PROPOFOL;  Surgeon: Lucilla Lame, MD;  Location: [facility] ENDOSCOPY;  Service: Endoscopy;  Laterality: N/A;  . FRACTURE SURGERY Right 03/19/2015   wrist  . HIATAL HERNIA REPAIR    . ILIAC ARTERY STENT  12/15/2005   PTA and direct stenting rgt and lft common iliac arteries  . KYPHOPLASTY N/A 04/05/2019   Procedure: KYPHOPLASTY T12, L3, L4 L5;  Surgeon: Hessie Knows, MD;  Location: [facility] ORS;  Service: Orthopedics;  Laterality: N/A;  . NM GATED MYOVIEW ([place] [facility])  12/2017   Low Risk - no ischemia or infarct.  EF > 65%. no RWMA  . NM MYOVIEW ([place] [facility])  11/11/2010; February 2016   a) TM STRESS----NORMAL PERFUSION ,EF 68%; b) EF 50-55%. Normal LV function. No significant ischemia or infarction.  . OPEN REDUCTION INTERNAL FIXATION (ORIF) DISTAL RADIAL FRACTURE Left 01/13/2019   Procedure: OPEN REDUCTION INTERNAL FIXATION (ORIF) DISTAL  RADIAL FRACTURE - LEFT - SLEEP APNEA;  Surgeon: Hessie Knows, MD;  Location: [facility] ORS;  Service: Orthopedics;  Laterality: Left;  . ORIF ELBOW FRACTURE Right 01/01/2017   Procedure: OPEN REDUCTION INTERNAL FIXATION (ORIF) ELBOW/OLECRANON FRACTURE;  Surgeon: Hessie Knows, MD;  Location: [facility] ORS;  Service: Orthopedics;  Laterality: Right;  . ORIF WRIST FRACTURE Right 03/19/2015   Procedure: OPEN REDUCTION INTERNAL FIXATION (ORIF) WRIST FRACTURE;  Surgeon: Hessie Knows, MD;  Location: [facility] ORS;  Service: Orthopedics;  Laterality: Right;  . THORACIC AORTA - CAROTID ANGIOGRAM  October 2007   Dr. 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 TRANSTHORACIC  ECHOCARDIOGRAM  February 2016   [facility]: Normal LV function. Dilated left atrium.    There were no vitals filed for this visit.  Subjective Assessment - 09/01/19 0946    Subjective  Patient reports compliance with HEP, no falls or LOB since last session.    Pertinent History  Patient referred to physical therapy for balance/strength issues s/p abdominal aortic aneurysm (AAA) without rupture repair on 05/18/19 .  Patient has been seen by this therapist in the past (last seen in march of 2020) Patient had a fall on 01/08/19, MRI from [facility] dated 03/28/19 report acute compression fractures at T12, L3 and L4 as well as facet arthropathy at the lower lumbar levels and at L3-4 moderate central stenosis. Patient underwent injection on 10/6 at L4-5 and L5-S1. PMH includes AAA, arthritis, atherosclerotic heart disease, (s/p CABG), benign neoplasm of ascending/descending colon, COPD, CAD, dementia, dysrhythmia,  frequent falls, GERD, hemorrhoid, HOH, HTN, PAF, prostate cancer. Monday through Friday have 3 people come in different times from 730 to 12pm. Uses a walker occasionally now    Currently in Pain?  No/denies            Treatment:   Nustep Lvl 3 RPM > 65 for cardiovascular challenge x 4 minutes  7" step step ups BUE support 12x each LE; very tiring to patient   5lb ankle weights: Standing 7" step toe taps 10x each side; BUE support Standing 7" step lateral toe taps 15x each side; SUE support Seated marching with upright posture 15x  LAQ 10x each side cueing for control of eccentric portion of ambulation  ambulate >1000 ft with CGA and no AD, cueing for increasing step length , safety negotiating turns/corners. And safety awareness. One near LOB, very fatigued by end of ambulation     10 STS from standard height chair. ; 2 sets, improved use of LE's with hands on knees second set.    Seated adduction with LAQ squeezing rainbow ball between feet 12   GTB rows 15x   GTB abduction 20x, cueing  for decreased velocity   GTB hamstring curl 15x each LE with PT resistance   GTB dorsiflexion resistance 15x each LE  GTB ER/IR 15x each side     Vitals monitored throughout session during rest periods to ensure O2 saturation return to functional range prior to initiation of each exercise.      Pt educated throughout session about proper posture and technique with exercises. Improved exercise technique, movement at target joints, use of target muscles after min to mod verbal, visual, tactile cues.                      PT Education - 09/01/19 0947    Education provided  Yes    Education Details  exercise technique, body mechanics    Person(s) Educated  Patient    Methods  Explanation;Demonstration;Tactile cues;Verbal cues    Comprehension  Verbalized understanding;Returned demonstration;Verbal cues required;Tactile cues required       PT Short Term Goals - 08/09/19 1745      PT SHORT TERM GOAL #1   Title  Patient will be independent in home exercise program to improve strength/mobility for better functional independence with ADLs.    Baseline  10/16: HEP given 11/17: HEP compliant    Time  4    Period  Weeks    Status  Achieved    Target Date  07/15/19      PT SHORT TERM GOAL #2   Title  Patient will deny any falls over past 4 weeks to demonstrate improved safety awareness at home and work.    Baseline  10/16: one major fall 11/17: no falls    Time  4    Period  Weeks    Status  Achieved    Target Date  07/15/19        PT Long Term Goals - 08/30/19 1011      PT LONG TERM GOAL #1   Title  Patient will increase BLE gross strength to 4+/5 as to improve functional strength for independent gait, increased standing tolerance and increased ADL ability.    Baseline  10/16: R gross 4-/5 with add/ext 3+/5 L grossly 3+/5 with hip add/ext 2+/5 11/17: R 4/5 add/ext; -/5 L 4-/5 hip add/ext 3/5 12/8: R: 4/5 L 4-/5 hip add/ext 3+/5    Time  8  Period  Weeks     Status  Partially Met    Target Date  10/04/19      PT LONG TERM GOAL #2   Title  Patient (> 71 years old) will complete five times sit to stand test in < 15 seconds indicating an increased LE strength and improved balance.    Baseline  10/16: 17 seconds with heavy use of hands on knees 11/17: 16 seconds one near LOB 12/8: 13 seconds one posterior LOB    Time  8    Period  Weeks    Status  Achieved      PT LONG TERM GOAL #3   Title  Patient will increase Berg Balance score by > 6 points (43/56)  to demonstrate decreased fall risk during functional activities.    Baseline  10/16: 37/56 11/17: 42/56 12/8: 47/56    Time  8    Period  Weeks    Status  Achieved      PT LONG TERM GOAL #4   Title  Patient will increase 10 meter walk test to >1.68ms as to improve gait speed for better community ambulation and to reduce fall risk.    Baseline  10/16: 0.91 w/o AD 11/17: 1.43 m/s with no AD    Time  8    Period  Weeks    Status  Achieved      PT LONG TERM GOAL #5   Title  Patient will increase ABC scale score >80% to demonstrate better functional mobility and better confidence with ADLs.    Baseline  10/16; 71% 11/17: 80% 12/8: 83%    Time  8    Period  Weeks    Status  Achieved      PT LONG TERM GOAL #6   Title  Patient will improve score on DGI to > or = 19/24 on the DGI to increase dynamic balance during mobility and to decrease risk of falls.    Baseline  12/8: perform next session 12/29: 18/24    Time  8    Period  Weeks    Status  Partially Met    Target Date  10/04/19      PT LONG TERM GOAL #7   Title  Patient will increase Berg Balance score by > 6 points (53/56)  to demonstrate decreased fall risk during functional activities.    Baseline  12/8; 47/56 12/29: 49/56    Time  8    Period  Weeks    Status  Partially Met    Target Date  10/04/19              Patient will benefit from skilled therapeutic intervention in order to improve the following deficits and  impairments:     Visit Diagnosis: No diagnosis found.     Problem List Patient Active Problem List   Diagnosis Date Noted  . Status post abdominal aortic aneurysm (AAA) repair 04/15/2019  . Candidiasis 02/22/2019  . Malignant neoplasm of descending colon ([place]   . Benign neoplasm of descending colon   . Benign neoplasm of transverse colon   . Do not intubate, cardiopulmonary resuscitation (CPR)-only code status 10/08/2018  . Prostate cancer ([place] 02/25/2018  . Prostatic cyst 01/15/2018  . 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 ([place] 09/15/2017  . Blood in stool   . Abnormal feces   . Stricture and stenosis of esophagus   . Mild cognitive impairment 08/18/2017  . Senile purpura ([place] 04/29/2017  . Anemia 04/29/2017  .  Frequent falls 04/29/2017  . Acute respiratory failure ([place]) 12/17/2016  . Simple chronic bronchitis ([place]) 12/17/2016  . Benign localized hyperplasia of prostate with urinary obstruction 07/15/2016  . Elevated PSA 07/15/2016  . Incomplete emptying of bladder 07/15/2016  . Urge incontinence 07/15/2016  . Hypothyroidism 04/25/2016  . Macular degeneration 04/25/2016  . Erectile dysfunction due to arterial insufficiency   . Ischemic heart disease with chronotropic incompetence   . CN (constipation) 02/10/2015  . DD (diverticular disease) 02/10/2015  . Fatigue 02/10/2015  . Lichen planus 51/10/5850  . Restless leg 02/10/2015  . 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 B12 deficiency 02/10/2015  . COPD (chronic obstructive pulmonary disease) ([place]) 11/14/2014  . Post Inflammatory Lung Changes 11/14/2014  . PAF (paroxysmal atrial fibrillation) ([facility]) CHA2DS2-VASc = 4. AC = Eliquis   . Insomnia 12/09/2013  . OSA on CPAP   . Dyspnea on exertion - -essentially resolved with BB dose reduction & wgt loss 03/29/2013  . Overweight (BMI 25.0-29.9) -- Wgt back up 01/17/2013  . Atherosclerotic heart disease of native coronary artery without angina pectoris - s/p CABG, occluded  SVG-D1; patent LIMA-LAD, SVG-OM, SVG- RCA   . Essential hypertension   . Hyperlipidemia with target LDL less than 70   . Aorto-iliac disease ([place])   . BCC (basal cell carcinoma), eyelid 01/03/2013  . Allergic rhinitis 08/17/2009  . Adaptation reaction 05/28/2009  . Acid reflux 05/28/2009  . Arthritis, degenerative 05/28/2009  . 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 Carotid artery occlusion without infarction 06/01/2006  . PAD (peripheral artery disease) - bilateral common iliac stents 12/15/2005  . Aortoiliac occlusive disease ([place]) 11/30/2005  . Atherosclerotic heart disease of artery bypass graft 09/01/1997   Janna Arch, PT, DPT   09/01/2019, 9:48 AM  [place] MAIN [facility] SERVICES [address] [place], [place], [address] Phone: [phone]   Fax:  [phone]  Name: [name] MRN: [id number] Date of Birth: 15-Oct-1933

## 2019-09-02 DIAGNOSIS — T148XXA Other injury of unspecified body region, initial encounter: Secondary | ICD-10-CM

## 2019-09-02 HISTORY — DX: Other injury of unspecified body region, initial encounter: T14.8XXA

## 2019-09-05 ENCOUNTER — Ambulatory Visit: Payer: Medicare Other

## 2019-09-07 ENCOUNTER — Other Ambulatory Visit: Payer: Self-pay

## 2019-09-07 ENCOUNTER — Ambulatory Visit: Payer: Medicare Other | Attending: Nurse Practitioner

## 2019-09-07 DIAGNOSIS — R262 Difficulty in walking, not elsewhere classified: Secondary | ICD-10-CM | POA: Insufficient documentation

## 2019-09-07 DIAGNOSIS — M6281 Muscle weakness (generalized): Secondary | ICD-10-CM | POA: Insufficient documentation

## 2019-09-07 DIAGNOSIS — R2689 Other abnormalities of gait and mobility: Secondary | ICD-10-CM

## 2019-09-07 DIAGNOSIS — R2681 Unsteadiness on feet: Secondary | ICD-10-CM

## 2019-09-07 NOTE — Therapy (Signed)
[place] MAIN [facility] SERVICES [address] [place], [place], [address] Phone: [phone]   Fax:  [phone]  Physical Therapy Treatment  Patient Details  Name: [name] MRN: [id number] Date of Birth: Mar 01, 1934 Referring Provider (PT): Eulogio Ditch   Encounter Date: 09/07/2019  PT End of Session - 09/07/19 1022    Visit Number  22    Number of Visits  30    Date for PT Re-Evaluation  10/04/19    Authorization Type  next session 2/10 PN 08/30/19    PT Start Time  1015    PT Stop Time  1059    PT Time Calculation (min)  44 min    Equipment Utilized During Treatment  Gait belt    Activity Tolerance  Patient tolerated treatment well    Behavior During Therapy  [facility] for tasks assessed/performed       Past Medical History:  Diagnosis Date  . AAA (abdominal aortic aneurysm) ([place])   . Arthritis   . Atherosclerotic heart disease of native coronary artery without angina pectoris 1998   - s/p CABG, occluded SVG-D1; patent LIMA-LAD, SVG-OM, SVG- RCA  . Basal cell carcinoma of eyelid 01/03/2013   2019 - R side of Nose  . Benign neoplasm of ascending colon   . Benign neoplasm of descending colon   . Cancer [facility])    Skin cancer  . Colon polyps   . COPD (chronic obstructive pulmonary disease) ([place])   . Coronary artery disease   . Dementia ([place])   . Dysrhythmia    atrial fib  . Falls frequently   . GERD (gastroesophageal reflux disease)   . Hemorrhoid 02/10/2015  . History of hiatal hernia   . History of kidney stones   . HOH (hard of hearing)   . Hypertension   . Memory loss   . Osteoporosis   . PAF (paroxysmal atrial fibrillation) ([place])   . Prostate cancer ([place])   . Prostate disorder   . Sciatic pain    Chronic  . Sleep apnea    cpap  . Temporary cerebral vascular dysfunction 02/10/2015   Had negative work-up.  July, 2012.  No medication changes, had forgot them that day, got dehydrated.   . Urinary incontinence      Past Surgical History:  Procedure Laterality Date  . ABDOMINAL AORTIC ENDOVASCULAR STENT GRAFT Bilateral 05/18/2019   Procedure: ABDOMINAL AORTIC ENDOVASCULAR STENT GRAFT;  Surgeon: Algernon Huxley, MD;  Location: [facility] ORS;  Service: Vascular;  Laterality: Bilateral;  . ANKLE SURGERY    . Arch aortogram and carotid aortogram  06/02/2006   Dr Oneida Alar did surgery  . BASAL CELL CARCINOMA EXCISION  04/2016   Dermatology  . CARDIAC CATHETERIZATION  01/30/1998    (Dr. Linard Millers): Native LAD & mRCA CTO. 90% D1. Mod Cx. SVG-D1 CTO. SVG-RCA & SVG-OM along with LIMA-LAD patent;  LV NORMAL Fxn  . CAROTID ENDARTERECTOMY Left Oct. 29, 2007   Dr. Oneida Alar:  . CATARACT EXTRACTION W/PHACO Right 03/05/2017   Procedure: CATARACT EXTRACTION PHACO AND INTRAOCULAR LENS PLACEMENT (IOC);  Surgeon: Leandrew Koyanagi, MD;  Location: [facility] ORS;  Service: Ophthalmology;  Laterality: Right;  [place] 00:58.5 AP% 10.6 CDE 6.20 Fluid Pack lot # [id number]  . CATARACT EXTRACTION W/PHACO Left 04/15/2017   Procedure: CATARACT EXTRACTION PHACO AND INTRAOCULAR LENS PLACEMENT ([place])  Left;  Surgeon: Leandrew Koyanagi, MD;  Location: [place];  Service: Ophthalmology;  Laterality: Left;  . COLONOSCOPY WITH PROPOFOL N/A  09/15/2017   Procedure: COLONOSCOPY WITH PROPOFOL;  Surgeon: Lucilla Lame, MD;  Location: [facility] ENDOSCOPY;  Service: Endoscopy;  Laterality: N/A;  . COLONOSCOPY WITH PROPOFOL N/A 10/26/2018   Procedure: COLONOSCOPY WITH PROPOFOL;  Surgeon: Lucilla Lame, MD;  Location: [facility] ENDOSCOPY;  Service: Endoscopy;  Laterality: N/A;  . CORONARY ARTERY BYPASS GRAFT  1998   LIMA-LAD, SVG-OM, SVG-RPDA, SVG-DIAG  . CPET / MET  12/2012   Mild chronotropic incompetence - read 82% of predicted; also reduced effort; peak VO2 15.7 / 75% (did not reach Max effort) -- suggested ischemic response in last 1.5 minutes of exercise. Normal pulmonary function on PFTs but poor response to  . DOPPLER ECHOCARDIOGRAPHY  11/20/2010   EF =>55%;  LV norm mild aortic scelorosis  . ENDOVASCULAR REPAIR/STENT GRAFT N/A 05/18/2019   Procedure: ENDOVASCULAR REPAIR/STENT GRAFT;  Surgeon: Algernon Huxley, MD;  Location: [place] CV LAB;  Service: Cardiovascular;  Laterality: N/A;  . ESOPHAGOGASTRODUODENOSCOPY (EGD) WITH PROPOFOL N/A 09/15/2017   Procedure: ESOPHAGOGASTRODUODENOSCOPY (EGD) WITH PROPOFOL;  Surgeon: Lucilla Lame, MD;  Location: [facility] ENDOSCOPY;  Service: Endoscopy;  Laterality: N/A;  . FRACTURE SURGERY Right 03/19/2015   wrist  . HIATAL HERNIA REPAIR    . ILIAC ARTERY STENT  12/15/2005   PTA and direct stenting rgt and lft common iliac arteries  . KYPHOPLASTY N/A 04/05/2019   Procedure: KYPHOPLASTY T12, L3, L4 L5;  Surgeon: Hessie Knows, MD;  Location: [facility] ORS;  Service: Orthopedics;  Laterality: N/A;  . NM GATED MYOVIEW ([place] [facility])  12/2017   Low Risk - no ischemia or infarct.  EF > 65%. no RWMA  . NM MYOVIEW ([place] [facility]X)  11/11/2010; February 2016   a) TM STRESS----NORMAL PERFUSION ,EF 68%; b) EF 50-55%. Normal LV function. No significant ischemia or infarction.  . OPEN REDUCTION INTERNAL FIXATION (ORIF) DISTAL RADIAL FRACTURE Left 01/13/2019   Procedure: OPEN REDUCTION INTERNAL FIXATION (ORIF) DISTAL  RADIAL FRACTURE - LEFT - SLEEP APNEA;  Surgeon: Hessie Knows, MD;  Location: [facility] ORS;  Service: Orthopedics;  Laterality: Left;  . ORIF ELBOW FRACTURE Right 01/01/2017   Procedure: OPEN REDUCTION INTERNAL FIXATION (ORIF) ELBOW/OLECRANON FRACTURE;  Surgeon: Hessie Knows, MD;  Location: [facility] ORS;  Service: Orthopedics;  Laterality: Right;  . ORIF WRIST FRACTURE Right 03/19/2015   Procedure: OPEN REDUCTION INTERNAL FIXATION (ORIF) WRIST FRACTURE;  Surgeon: Hessie Knows, MD;  Location: [facility] ORS;  Service: Orthopedics;  Laterality: Right;  . THORACIC AORTA - CAROTID ANGIOGRAM  October 2007   Dr. 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 TRANSTHORACIC  ECHOCARDIOGRAM  February 2016   [facility]: Normal LV function. Dilated left atrium.    There were no vitals filed for this visit.  Subjective Assessment - 09/07/19 1020    Subjective  Patient reports compliance with HEP. Missed last session due to having seasonal allergies. No falls or LOB since last session.    Pertinent History  Patient referred to physical therapy for balance/strength issues s/p abdominal aortic aneurysm (AAA) without rupture repair on 05/18/19 .  Patient has been seen by this therapist in the past (last seen in march of 2020) Patient had a fall on 01/08/19, MRI from [facility] dated 03/28/19 report acute compression fractures at T12, L3 and L4 as well as facet arthropathy at the lower lumbar levels and at L3-4 moderate central stenosis. Patient underwent injection on 10/6 at L4-5 and L5-S1. PMH includes AAA, arthritis, atherosclerotic heart disease, (s/p CABG), benign  neoplasm of ascending/descending colon, COPD, CAD, dementia, dysrhythmia, frequent falls, GERD, hemorrhoid, HOH, HTN, PAF, prostate cancer. Monday through Friday have 3 people come in different times from 730 to 12pm. Uses a walker occasionally now    Currently in Pain?  No/denies            Nustep Lvl 3 RPM > 65 for cardiovascular challenge x 4 minutes     5lb ankle weights: Standing 7" step toe taps 10x each side; BUE support Hip abduction BUE support, fatiguing to patient, cueing for keeping toes forward 12x each LE Hip extension 12x each LE, BUE support, cueing for trunk posture.  Seated marching with upright posture 15x  LAQ 10x each side cueing for control of eccentric portion of ambulation   ambulate >1000 ft with CGA and no AD, cueing for increasing step length , safety negotiating turns/corners. And safety awareness. One near LOB, very fatigued by end of ambulation   Seated adduction with LAQ squeezing rainbow ball between feet 12   GTB rows 15x    GTB abduction 20x, cueing for decreased velocity     GTB hamstring curl 15x each LE with PT resistance    Adduction ball squeeze with ER/IR 10x each side   Adduction ball squeeze 10x 3 second holds      Vitals monitored throughout session during rest periods to ensure O2 saturation return to functional range prior to initiation of each exercise.      Pt educated throughout session about proper posture and technique with exercises. Improved exercise technique, movement at target joints, use of target muscles after min to mod verbal, visual, tactile cues.                    PT Education - 09/07/19 1021    Education provided  Yes    Education Details  exercise technique, body mechanics    Person(s) Educated  Patient    Methods  Explanation;Demonstration;Tactile cues;Verbal cues    Comprehension  Verbalized understanding;Returned demonstration;Verbal cues required;Tactile cues required       PT Short Term Goals - 08/09/19 1745      PT SHORT TERM GOAL #1   Title  Patient will be independent in home exercise program to improve strength/mobility for better functional independence with ADLs.    Baseline  10/16: HEP given 11/17: HEP compliant    Time  4    Period  Weeks    Status  Achieved    Target Date  07/15/19      PT SHORT TERM GOAL #2   Title  Patient will deny any falls over past 4 weeks to demonstrate improved safety awareness at home and work.    Baseline  10/16: one major fall 11/17: no falls    Time  4    Period  Weeks    Status  Achieved    Target Date  07/15/19        PT Long Term Goals - 08/30/19 1011      PT LONG TERM GOAL #1   Title  Patient will increase BLE gross strength to 4+/5 as to improve functional strength for independent gait, increased standing tolerance and increased ADL ability.    Baseline  10/16: R gross 4-/5 with add/ext 3+/5 L grossly 3+/5 with hip add/ext 2+/5 11/17: R 4/5 add/ext; -/5 L 4-/5 hip add/ext 3/5 12/8: R: 4/5 L 4-/5 hip add/ext 3+/5    Time  8    Period  Weeks  Status  Partially Met    Target Date  10/04/19      PT LONG TERM GOAL #2   Title  Patient (> 39 years old) will complete five times sit to stand test in < 15 seconds indicating an increased LE strength and improved balance.    Baseline  10/16: 17 seconds with heavy use of hands on knees 11/17: 16 seconds one near LOB 12/8: 13 seconds one posterior LOB    Time  8    Period  Weeks    Status  Achieved      PT LONG TERM GOAL #3   Title  Patient will increase Berg Balance score by > 6 points (43/56)  to demonstrate decreased fall risk during functional activities.    Baseline  10/16: 37/56 11/17: 42/56 12/8: 47/56    Time  8    Period  Weeks    Status  Achieved      PT LONG TERM GOAL #4   Title  Patient will increase 10 meter walk test to >1.79ms as to improve gait speed for better community ambulation and to reduce fall risk.    Baseline  10/16: 0.91 w/o AD 11/17: 1.43 m/s with no AD    Time  8    Period  Weeks    Status  Achieved      PT LONG TERM GOAL #5   Title  Patient will increase ABC scale score >80% to demonstrate better functional mobility and better confidence with ADLs.    Baseline  10/16; 71% 11/17: 80% 12/8: 83%    Time  8    Period  Weeks    Status  Achieved      PT LONG TERM GOAL #6   Title  Patient will improve score on DGI to > or = 19/24 on the DGI to increase dynamic balance during mobility and to decrease risk of falls.    Baseline  12/8: perform next session 12/29: 18/24    Time  8    Period  Weeks    Status  Partially Met    Target Date  10/04/19      PT LONG TERM GOAL #7   Title  Patient will increase Berg Balance score by > 6 points (53/56)  to demonstrate decreased fall risk during functional activities.    Baseline  12/8; 47/56 12/29: 49/56    Time  8    Period  Weeks    Status  Partially Met    Target Date  10/04/19            Plan - 09/07/19 1043    Clinical Impression Statement  Patient demonstrated increased velocity of ambulation with  prolonged mobility without need for rest break. He is improving capacity of mobility however does need rest breaks after strengthening interventions. .The patient would benefit from continued skilled PT intervention to maximize mobility, safety, and functional abilities    Personal Factors and Comorbidities  Age;Comorbidity 3+;Fitness;Past/Current Experience;Sex;Social Background;Time since onset of injury/illness/exacerbation;Transportation    Comorbidities  AAA, arthritis, atherosclerotic heart disease, (s/p CABG), benign neoplasm of ascending/descending colon, COPD, CAD, dementia, dysrhythmia, frequent falls, GERD, hemorrhoid, HOH, HTN, PAF, prostate cancer.    Examination-Activity Limitations  Bathing;Bend;Carry;Dressing;Continence;Stairs;Squat;Reach Overhead;Locomotion Level;Lift;Stand;Transfers;Toileting    Examination-Participation Restrictions  Church;Cleaning;Community Activity;Driving;Interpersonal Relationship;Laundry;Volunteer;Shop;Personal Finances;Meal Prep;Yard Work    RPublixPotential  Fair    Clinical Impairments Affecting Rehab Potential  (+) good support system, high prior level of function (-) currently undergoing radiation therapy for prostate  cancer, memory deficits     PT Frequency  2x / week    PT Duration  8 weeks    PT Treatment/Interventions  ADLs/Self Care Home Management;Aquatic Therapy;Cryotherapy;Moist Heat;Traction;Ultrasound;Therapeutic activities;Functional mobility training;Stair training;Gait training;DME Instruction;Therapeutic exercise;Balance training;Neuromuscular re-education;Patient/family education;Manual techniques;Passive range of motion;Energy conservation;Vestibular;Taping    PT Next Visit Plan  progress dynamic stability    PT Home Exercise Plan  see above    Consulted and Agree with Plan of Care  Patient       Patient will benefit from skilled therapeutic intervention in order to improve the following deficits and impairments:  Abnormal gait,  Cardiopulmonary status limiting activity, Decreased activity tolerance, Decreased balance, Decreased knowledge of precautions, Decreased endurance, Decreased coordination, Decreased cognition, Decreased knowledge of use of DME, Decreased mobility, Difficulty walking, Decreased safety awareness, Decreased strength, Impaired flexibility, Impaired perceived functional ability, Impaired sensation, Postural dysfunction, Improper body mechanics, Pain  Visit Diagnosis: Unsteadiness on feet  Muscle weakness (generalized)  Other abnormalities of gait and mobility     Problem List Patient Active Problem List   Diagnosis Date Noted  . Status post abdominal aortic aneurysm (AAA) repair 04/15/2019  . Candidiasis 02/22/2019  . Malignant neoplasm of descending colon ([place])   . Benign neoplasm of descending colon   . Benign neoplasm of transverse colon   . Do not intubate, cardiopulmonary resuscitation (CPR)-only code status 10/08/2018  . Prostate cancer ([place]) 02/25/2018  . Prostatic cyst 01/15/2018  . 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 ([place]) 09/15/2017  . Blood in stool   . Abnormal feces   . Stricture and stenosis of esophagus   . Mild cognitive impairment 08/18/2017  . Senile purpura ([place]) 04/29/2017  . Anemia 04/29/2017  . Frequent falls 04/29/2017  . Acute respiratory failure ([place]) 12/17/2016  . Simple chronic bronchitis ([place]) 12/17/2016  . Benign localized hyperplasia of prostate with urinary obstruction 07/15/2016  . Elevated PSA 07/15/2016  . Incomplete emptying of bladder 07/15/2016  . Urge incontinence 07/15/2016  . Hypothyroidism 04/25/2016  . Macular degeneration 04/25/2016  . Erectile dysfunction due to arterial insufficiency   . Ischemic heart disease with chronotropic incompetence   . CN (constipation) 02/10/2015  . DD (diverticular disease) 02/10/2015  . Fatigue 02/10/2015  . Lichen planus 07/01/5944  . Restless leg 02/10/2015  . 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 B12  deficiency 02/10/2015  . COPD (chronic obstructive pulmonary disease) ([place]) 11/14/2014  . Post Inflammatory Lung Changes 11/14/2014  . PAF (paroxysmal atrial fibrillation) ([facility]) CHA2DS2-VASc = 4. AC = Eliquis   . Insomnia 12/09/2013  . OSA on CPAP   . Dyspnea on exertion - -essentially resolved with BB dose reduction & wgt loss 03/29/2013  . Overweight (BMI 25.0-29.9) -- Wgt back up 01/17/2013  . Atherosclerotic heart disease of native coronary artery without angina pectoris - s/p CABG, occluded SVG-D1; patent LIMA-LAD, SVG-OM, SVG- RCA   . Essential hypertension   . Hyperlipidemia with target LDL less than 70   . Aorto-iliac disease ([place])   . BCC (basal cell carcinoma), eyelid 01/03/2013  . Allergic rhinitis 08/17/2009  . Adaptation reaction 05/28/2009  . Acid reflux 05/28/2009  . Arthritis, degenerative 05/28/2009  . 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 Carotid artery occlusion without infarction 06/01/2006  . PAD (peripheral artery disease) - bilateral common iliac stents 12/15/2005  . Aortoiliac occlusive disease ([place]) 11/30/2005  . Atherosclerotic heart disease of artery bypass graft 09/01/1997   Janna Arch, PT, DPT   09/07/2019, 11:01 AM  [place]  [place] MAIN [facility] SERVICES [place], [place], [address] Phone: [phone]   Fax:  [phone]  Name: [name] MRN: [id number] Date of Birth: [birth year]

## 2019-09-12 ENCOUNTER — Ambulatory Visit: Payer: Medicare Other

## 2019-09-12 ENCOUNTER — Other Ambulatory Visit: Payer: Self-pay

## 2019-09-12 DIAGNOSIS — R2681 Unsteadiness on feet: Secondary | ICD-10-CM

## 2019-09-12 DIAGNOSIS — R2689 Other abnormalities of gait and mobility: Secondary | ICD-10-CM

## 2019-09-12 DIAGNOSIS — M6281 Muscle weakness (generalized): Secondary | ICD-10-CM

## 2019-09-12 NOTE — Therapy (Signed)
[place] MAIN [facility] SERVICES [address] [place], [place], [address] Phone: [phone]   Fax:  [phone]  Physical Therapy Treatment  Patient Details  Name: [name] MRN: [id number] Date of Birth: [birth year] Referring Provider (PT): Eulogio Ditch   Encounter Date: 84/07/2020  PT End of Session - 09/12/19 1023    Visit Number  23    Number of Visits  30    Date for PT Re-Evaluation  10/04/19    Authorization Type  next session 3/10 PN 08/30/19    PT Start Time  1016    PT Stop Time  1059    PT Time Calculation (min)  43 min    Equipment Utilized During Treatment  Gait belt    Activity Tolerance  Patient tolerated treatment well    Behavior During Therapy  [facility] for tasks assessed/performed       Past Medical History:  Diagnosis Date  . AAA (abdominal aortic aneurysm) ([place])   . Arthritis   . Atherosclerotic heart disease of native coronary artery without angina pectoris 1998   - s/p CABG, occluded SVG-D1; patent LIMA-LAD, SVG-OM, SVG- RCA  . Basal cell carcinoma of eyelid 01/03/2013   2019 - R side of Nose  . Benign neoplasm of ascending colon   . Benign neoplasm of descending colon   . Cancer [facility])    Skin cancer  . Colon polyps   . COPD (chronic obstructive pulmonary disease) ([place])   . Coronary artery disease   . Dementia ([place])   . Dysrhythmia    atrial fib  . Falls frequently   . GERD (gastroesophageal reflux disease)   . Hemorrhoid 02/10/2015  . History of hiatal hernia   . History of kidney stones   . HOH (hard of hearing)   . Hypertension   . Memory loss   . Osteoporosis   . PAF (paroxysmal atrial fibrillation) ([place])   . Prostate cancer ([place])   . Prostate disorder   . Sciatic pain    Chronic  . Sleep apnea    cpap  . Temporary cerebral vascular dysfunction 02/10/2015   Had negative work-up.  July, 2012.  No medication changes, had forgot them that day, got dehydrated.   . Urinary incontinence      Past Surgical History:  Procedure Laterality Date  . ABDOMINAL AORTIC ENDOVASCULAR STENT GRAFT Bilateral 05/18/2019   Procedure: ABDOMINAL AORTIC ENDOVASCULAR STENT GRAFT;  Surgeon: William Huxley, MD;  Location: [facility] ORS;  Service: Vascular;  Laterality: Bilateral;  . ANKLE SURGERY    . Arch aortogram and carotid aortogram  06/02/2006   Dr Oneida Alar did surgery  . BASAL CELL CARCINOMA EXCISION  04/2016   Dermatology  . CARDIAC CATHETERIZATION  01/30/1998    (Dr. Linard Millers): Native LAD & mRCA CTO. 90% D1. Mod Cx. SVG-D1 CTO. SVG-RCA & SVG-OM along with LIMA-LAD patent;  LV NORMAL Fxn  . CAROTID ENDARTERECTOMY Left Oct. 29, 2007   Dr. Oneida Alar:  . CATARACT EXTRACTION W/PHACO Right 03/05/2017   Procedure: CATARACT EXTRACTION PHACO AND INTRAOCULAR LENS PLACEMENT (IOC);  Surgeon: Leandrew Koyanagi, MD;  Location: [facility] ORS;  Service: Ophthalmology;  Laterality: Right;  [place] 00:58.5 AP% 10.6 CDE 6.20 Fluid Pack lot # [id number]  . CATARACT EXTRACTION W/PHACO Left 04/15/2017   Procedure: CATARACT EXTRACTION PHACO AND INTRAOCULAR LENS PLACEMENT ([place])  Left;  Surgeon: Leandrew Koyanagi, MD;  Location: [place];  Service: Ophthalmology;  Laterality: Left;  . COLONOSCOPY WITH PROPOFOL N/A  09/15/2017   Procedure: COLONOSCOPY WITH PROPOFOL;  Surgeon: Lucilla Lame, MD;  Location[facility] END[facility]COPY;  Service: Endoscopy;  Laterality: N/A;  . COLONOSCOPY WITH PROPOFOL N/A 10/26/2018   Procedure: COLONOSCOPY WITH PROPOFOL;  Surgeon: Lucilla Lame, MD;  Location: [facility] ENDOSCOPY;  Service: Endoscopy;  Laterality: N/A;  . CORONARY ARTERY BYPASS GRAFT  1998   LIMA-LAD, SVG-OM, SVG-RPDA, SVG-DIAG  . CPET / MET  12/2012   Mild chronotropic incompetence - read 82% of predicted; also reduced effort; peak VO2 15.7 / 75% (did not reach Max effort) -- suggested ischemic response in last 1.5 minutes of exercise. Normal pulmonary function on PFTs but poor response to  . DOPPLER ECHOCARDIOGRAPHY  11/20/2010   EF =>55%;  LV norm mild aortic scelorosis  . ENDOVASCULAR REPAIR/STENT GRAFT N/A 05/18/2019   Procedure: ENDOVASCULAR REPAIR/STENT GRAFT;  Surgeon: William Huxley, MD;  Location: [place] CV LAB;  Service: Cardiovascular;  Laterality: N/A;  . ESOPHAGOGASTRODUODENOSCOPY (EGD) WITH PROPOFOL N/A 09/15/2017   Procedure: ESOPHAGOGASTRODUODENOSCOPY (EGD) WITH PROPOFOL;  Surgeon: Lucilla Lame, MD;  Location: [facility] ENDOSCOPY;  Service: Endoscopy;  Laterality: N/A;  . FRACTURE SURGERY Right 03/19/2015   wrist  . HIATAL HERNIA REPAIR    . ILIAC ARTERY STENT  12/15/2005   PTA and direct stenting rgt and lft common iliac arteries  . KYPHOPLASTY N/A 04/05/2019   Procedure: KYPHOPLASTY T12, L3, L4 L5;  Surgeon: Hessie Knows, MD;  Location: [facility] ORS;  Service: Orthopedics;  Laterality: N/A;  . NM GATED MYOVIEW ([place] HX)  12/2017   Low Risk - no ischemia or infarct.  EF > 65%. no RWMA  . NM MYOVIEW ([place] HX)  11/11/2010; February 2016   a) TM STRESS----NORMAL PERFUSION ,EF 68%; b) EF 50-55%. Normal LV function. No significant ischemia or infarction.  . OPEN REDUCTION INTERNAL FIXATION (ORIF) DISTAL RADIAL FRACTURE Left 01/13/2019   Procedure: OPEN REDUCTION INTERNAL FIXATION (ORIF) DISTAL  RADIAL FRACTURE - LEFT - SLEEP APNEA;  Surgeon: Hessie Knows, MD;  Location: [facility] ORS;  Service: Orthopedics;  Laterality: Left;  . ORIF ELBOW FRACTURE Right 01/01/2017   Procedure: OPEN REDUCTION INTERNAL FIXATION (ORIF) ELBOW/OLECRANON FRACTURE;  Surgeon: Hessie Knows, MD;  Location: [facility] ORS;  Service: Orthopedics;  Laterality: Right;  . ORIF WRIST FRACTURE Right 03/19/2015   Procedure: OPEN REDUCTION INTERNAL FIXATION (ORIF) WRIST FRACTURE;  Surgeon: Hessie Knows, MD;  Location: [facility] ORS;  Service: Orthopedics;  Laterality: Right;  . THORACIC AORTA - CAROTID ANGIOGRAM  October 2007   Dr. 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 TRANSTHORACIC  ECHOCARDIOGRAM  February 2016   [facility]: Normal LV function. Dilated left atrium.    There were no vitals filed for this visit.  Subjective Assessment - 09/12/19 1022    Subjective  Patient reports L shoulder pain, worsens to an 8/10 with overhead movement, is a 5/10 currently. No stumbles or falls since last session.    Pertinent History  Patient referred to physical therapy for balance/strength issues s/p abdominal aortic aneurysm (AAA) without rupture repair on 05/18/19 .  Patient has been seen by this therapist in the past (last seen in march of 2020) Patient had a fall on 01/08/19, MRI from [facility] dated 03/28/19 report acute compression fractures at T12, L3 and L4 as well as facet arthropathy at the lower lumbar levels and at L3-4 moderate central stenosis. Patient underwent injection on 10/6 at L4-5 and L5-S1. PMH includes AAA, arthritis, atherosclerotic heart disease, (  s/p CABG), benign neoplasm of ascending/descending colon, COPD, CAD, dementia, dysrhythmia, frequent falls, GERD, hemorrhoid, HOH, HTN, PAF, prostate cancer. Monday through Friday have 3 people come in different times from 730 to 12pm. Uses a walker occasionally now    Currently in Pain?  Yes    Pain Score  5     Pain Location  Shoulder    Pain Orientation  Left    Pain Descriptors / Indicators  Aching    Pain Type  Chronic pain    Pain Onset  1 to 4 weeks ago    Pain Frequency  Intermittent    Aggravating Factors   waking up in the morning              Treatment:  Standing interventions requires CGA at all times for stability and decreased fall risk   Nustep Lvl 34 RPM > 610fr cardiovascular challenge x 4 minutes    airex pad: One foot on 7" step one foot on airex pad; hold 30 seconds x 2 trials each LE    Standing Lateral cone tap with SUE support 12x each side, ; more challenging with RLE for coordination. Increased trunk flexion with difficulty   10x STS with straight arm support on bars to keep upright posture  , tap to chair for depth of squat    RTB around ankles :  Hip abduction BUE support, fatiguing to patient, cueing for keeping toes forward 12x each LE Hip extension 12x each LE, BUE support, cueing for trunk posture.  Seated marching with upright posture 15x  Hip flexion straight leg 12x    Seated: -seated calf raises with rainbow ball between knees 15x  -seated with rainbow ball between knees, ER/IR 12x each LE; alternating pattern, cueing for squeezing of ball between knees for adduction     GTB abduction 20x, cueing for decreased velocity    GTB hamstring curl 15x each LE with PT resistance    Seated modified windmill -terminated due to pain  Seated upright posture marches with arms crossed 15x each LE      Vitals monitored throughout session during rest periods to ensure O2 saturation return to functional range prior to initiation of each exercise.      Pt educated throughout session about proper posture and technique with exercises. Improved exercise technique, movement at target joints, use of target muscles after min to mod verbal, visual, tactile cues.   Patient presents with excellent motivation throughout physical therapy session. Is challenged with coordination of RLE more than LLE with single limb stability requiring SUE support. The patient would benefit from continued skilled PT intervention to maximize mobility, safety, and functional abilities                PT Education - 09/12/19 1022    Education provided  Yes    Education Details  exercise technique, body mechanics    Person(s) Educated  Patient    Methods  Explanation;Demonstration;Tactile cues;Verbal cues    Comprehension  Verbalized understanding;Returned demonstration;Verbal cues required;Tactile cues required       PT Short Term Goals - 08/09/19 1745      PT SHORT TERM GOAL #1   Title  Patient will be independent in home exercise program to improve strength/mobility for better  functional independence with ADLs.    Baseline  10/16: HEP given 11/17: HEP compliant    Time  4    Period  Weeks    Status  Achieved    Target  Date  07/15/19      PT SHORT TERM GOAL #2   Title  Patient will deny any falls over past 4 weeks to demonstrate improved safety awareness at home and work.    Baseline  10/16: one major fall 11/17: no falls    Time  4    Period  Weeks    Status  Achieved    Target Date  07/15/19        PT Long Term Goals - 08/30/19 1011      PT LONG TERM GOAL #1   Title  Patient will increase BLE gross strength to 4+/5 as to improve functional strength for independent gait, increased standing tolerance and increased ADL ability.    Baseline  10/16: R gross 4-/5 with add/ext 3+/5 L grossly 3+/5 with hip add/ext 2+/5 11/17: R 4/5 add/ext; -/5 L 4-/5 hip add/ext 3/5 12/8: R: 4/5 L 4-/5 hip add/ext 3+/5    Time  8    Period  Weeks    Status  Partially Met    Target Date  10/04/19      PT LONG TERM GOAL #2   Title  Patient (> 7 years old) will complete five times sit to stand test in < 15 seconds indicating an increased LE strength and improved balance.    Baseline  10/16: 17 seconds with heavy use of hands on knees 11/17: 16 seconds one near LOB 12/8: 13 seconds one posterior LOB    Time  8    Period  Weeks    Status  Achieved      PT LONG TERM GOAL #3   Title  Patient will increase Berg Balance score by > 6 points (43/56)  to demonstrate decreased fall risk during functional activities.    Baseline  10/16: 37/56 11/17: 42/56 12/8: 47/56    Time  8    Period  Weeks    Status  Achieved      PT LONG TERM GOAL #4   Title  Patient will increase 10 meter walk test to >1.25ms as to improve gait speed for better community ambulation and to reduce fall risk.    Baseline  10/16: 0.91 w/o AD 11/17: 1.43 m/s with no AD    Time  8    Period  Weeks    Status  Achieved      PT LONG TERM GOAL #5   Title  Patient will increase ABC scale score >80% to  demonstrate better functional mobility and better confidence with ADLs.    Baseline  10/16; 71% 11/17: 80% 12/8: 83%    Time  8    Period  Weeks    Status  Achieved      PT LONG TERM GOAL #6   Title  Patient will improve score on DGI to > or = 19/24 on the DGI to increase dynamic balance during mobility and to decrease risk of falls.    Baseline  12/8: perform next session 12/29: 18/24    Time  8    Period  Weeks    Status  Partially Met    Target Date  10/04/19      PT LONG TERM GOAL #7   Title  Patient will increase Berg Balance score by > 6 points (53/56)  to demonstrate decreased fall risk during functional activities.    Baseline  12/8; 47/56 12/29: 49/56    Time  8    Period  Weeks    Status  Partially Met    Target Date  10/04/19            Plan - 09/12/19 1044    Clinical Impression Statement  Patient presents with excellent motivation throughout physical therapy session. Is challenged with coordination of RLE more than LLE with single limb stability requiring SUE support. The patient would benefit from continued skilled PT intervention to maximize mobility, safety, and functional abilities    Personal Factors and Comorbidities  Age;Comorbidity 3+;Fitness;Past/Current Experience;Sex;Social Background;Time since onset of injury/illness/exacerbation;Transportation    Comorbidities  AAA, arthritis, atherosclerotic heart disease, (s/p CABG), benign neoplasm of ascending/descending colon, COPD, CAD, dementia, dysrhythmia, frequent falls, GERD, hemorrhoid, HOH, HTN, PAF, prostate cancer.    Examination-Activity Limitations  Bathing;Bend;Carry;Dressing;Continence;Stairs;Squat;Reach Overhead;Locomotion Level;Lift;Stand;Transfers;Toileting    Examination-Participation Restrictions  Church;Cleaning;Community Activity;Driving;Interpersonal Relationship;Laundry;Volunteer;Shop;Personal Finances;Meal Prep;Yard Work    [employer] Potential  Fair    Clinical Impairments Affecting Rehab  Potential  (+) good support system, high prior level of function (-) currently undergoing radiation therapy for prostate cancer, memory deficits     PT Frequency  2x / week    PT Duration  8 weeks    PT Treatment/Interventions  ADLs/Self Care Home Management;Aquatic Therapy;Cryotherapy;Moist Heat;Traction;Ultrasound;Therapeutic activities;Functional mobility training;Stair training;Gait training;DME Instruction;Therapeutic exercise;Balance training;Neuromuscular re-education;Patient/family education;Manual techniques;Passive range of motion;Energy conservation;Vestibular;Taping    PT Next Visit Plan  progress dynamic stability    PT Home Exercise Plan  see above    Consulted and Agree with Plan of Care  Patient       Patient will benefit from skilled therapeutic intervention in order to improve the following deficits and impairments:  Abnormal gait, Cardiopulmonary status limiting activity, Decreased activity tolerance, Decreased balance, Decreased knowledge of precautions, Decreased endurance, Decreased coordination, Decreased cognition, Decreased knowledge of use of DME, Decreased mobility, Difficulty walking, Decreased safety awareness, Decreased strength, Impaired flexibility, Impaired perceived functional ability, Impaired sensation, Postural dysfunction, Improper body mechanics, Pain  Visit Diagnosis: Unsteadiness on feet  Muscle weakness (generalized)  Other abnormalities of gait and mobility     Problem List Patient Active Problem List   Diagnosis Date Noted  . Status post abdominal aortic aneurysm (AAA) repair 04/15/2019  . Candidiasis 02/22/2019  . Malignant neoplasm of descending colon ([place])   . Benign neoplasm of descending colon   . Benign neoplasm of transverse colon   . Do not intubate, cardiopulmonary resuscitation (CPR)-only code status 10/08/2018  . Prostate cancer ([place]) 02/25/2018  . Prostatic cyst 01/15/2018  . 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 ([place]) 09/15/2017  .  Blood in stool   . Abnormal feces   . Stricture and stenosis of esophagus   . Mild cognitive impairment 08/18/2017  . Senile purpura ([place]) 04/29/2017  . Anemia 04/29/2017  . Frequent falls 04/29/2017  . Acute respiratory failure ([place]) 12/17/2016  . Simple chronic bronchitis ([place]) 12/17/2016  . Benign localized hyperplasia of prostate with urinary obstruction 07/15/2016  . Elevated PSA 07/15/2016  . Incomplete emptying of bladder 07/15/2016  . Urge incontinence 07/15/2016  . Hypothyroidism 04/25/2016  . Macular degeneration 04/25/2016  . Erectile dysfunction due to arterial insufficiency   . Ischemic heart disease with chronotropic incompetence   . CN (constipation) 02/10/2015  . DD (diverticular disease) 02/10/2015  . Fatigue 02/10/2015  . Lichen planus 91/79/1505  . Restless leg 02/10/2015  . 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 B12 deficiency 02/10/2015  . COPD (chronic obstructive pulmonary disease) ([place]) 11/14/2014  . Post Inflammatory Lung Changes 11/14/2014  . PAF (paroxysmal atrial fibrillation) ([facility]) CHA2DS2-VASc = 4. AC =  Eliquis   . Insomnia 12/09/2013  . OSA on CPAP   . Dyspnea on exertion - -essentially resolved with BB dose reduction & wgt loss 03/29/2013  . Overweight (BMI 25.0-29.9) -- Wgt back up 01/17/2013  . Atherosclerotic heart disease of native coronary artery without angina pectoris - s/p CABG, occluded SVG-D1; patent LIMA-LAD, SVG-OM, SVG- RCA   . Essential hypertension   . Hyperlipidemia with target LDL less than 70   . Aorto-iliac disease ([place])   . BCC (basal cell carcinoma), eyelid 01/03/2013  . Allergic rhinitis 08/17/2009  . Adaptation reaction 05/28/2009  . Acid reflux 05/28/2009  . Arthritis, degenerative 05/28/2009  . 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 Carotid artery occlusion without infarction 06/01/2006  . PAD (peripheral artery disease) - bilateral common iliac stents 12/15/2005  . Aortoiliac occlusive disease ([place])  11/30/2005  . Atherosclerotic heart disease of artery bypass graft 09/01/1997   Janna Arch, PT, DPT   09/12/2019, 10:59 AM  [place] MAIN [facility] SERVICES [address] [place], [place], [address] Phone: [phone]   Fax:  [phone]  Name: BERNAL LUHMAN MRN: [id number] Date of Birth: [birth year]

## 2019-09-14 ENCOUNTER — Ambulatory Visit: Payer: Medicare Other

## 2019-09-14 ENCOUNTER — Other Ambulatory Visit: Payer: Self-pay

## 2019-09-14 DIAGNOSIS — R2681 Unsteadiness on feet: Secondary | ICD-10-CM | POA: Diagnosis not present

## 2019-09-14 DIAGNOSIS — R2689 Other abnormalities of gait and mobility: Secondary | ICD-10-CM

## 2019-09-14 DIAGNOSIS — R262 Difficulty in walking, not elsewhere classified: Secondary | ICD-10-CM

## 2019-09-14 DIAGNOSIS — M6281 Muscle weakness (generalized): Secondary | ICD-10-CM

## 2019-09-14 NOTE — Therapy (Signed)
[place] MAIN [facility] [address] [place], [place], [address] Phone: [phone]   Fax:  [phone]  Physical Therapy Treatment  Patient Details  Name: [name] MRN: [id number] Date of Birth: May 27, 1934 Referring Provider (PT): Eulogio Ditch   Encounter Date: 09/14/2019  PT End of Session - 09/14/19 1224    Visit Number  24    Number of Visits  30    Date for PT Re-Evaluation  10/04/19    Authorization Type  next session 4/10 PN 08/30/19    PT Start Time  1015    PT Stop Time  1059    PT Time Calculation (min)  44 min    Equipment Utilized During Treatment  Gait belt    Activity Tolerance  Patient tolerated treatment well    Behavior During Therapy  [facility] for tasks assessed/performed       Past Medical History:  Diagnosis Date  . AAA (abdominal aortic aneurysm) ([place])   . Arthritis   . Atherosclerotic heart disease of native coronary artery without angina pectoris 1998   - s/p CABG, occluded SVG-D1; patent LIMA-LAD, SVG-OM, SVG- RCA  . Basal cell carcinoma of eyelid 01/03/2013   2019 - R side of Nose  . Benign neoplasm of ascending colon   . Benign neoplasm of descending colon   . Cancer [facility])    Skin cancer  . Colon polyps   . COPD (chronic obstructive pulmonary disease) ([place])   . Coronary artery disease   . Dementia ([place])   . Dysrhythmia    atrial fib  . Falls frequently   . GERD (gastroesophageal reflux disease)   . Hemorrhoid 02/10/2015  . History of hiatal hernia   . History of kidney stones   . HOH (hard of hearing)   . Hypertension   . Memory loss   . Osteoporosis   . PAF (paroxysmal atrial fibrillation) ([place])   . Prostate cancer ([place])   . Prostate disorder   . Sciatic pain    Chronic  . Sleep apnea    cpap  . Temporary cerebral vascular dysfunction 02/10/2015   Had negative work-up.  July, 2012.  No medication changes, had forgot them that day, got dehydrated.   . Urinary incontinence      Past Surgical History:  Procedure Laterality Date  . ABDOMINAL AORTIC ENDOVASCULAR STENT GRAFT Bilateral 05/18/2019   Procedure: ABDOMINAL AORTIC ENDOVASCULAR STENT GRAFT;  Surgeon: Algernon Huxley, MD;  Location: [facility] ORS;  Service: Vascular;  Laterality: Bilateral;  . ANKLE SURGERY    . Arch aortogram and carotid aortogram  06/02/2006   Dr Oneida Alar did surgery  . BASAL CELL CARCINOMA EXCISION  04/2016   Dermatology  . CARDIAC CATHETERIZATION  01/30/1998    (Dr. Linard Millers): Native LAD & mRCA CTO. 90% D1. Mod Cx. SVG-D1 CTO. SVG-RCA & SVG-OM along with LIMA-LAD patent;  LV NORMAL Fxn  . CAROTID ENDARTERECTOMY Left Oct. 29, 2007   Dr. Oneida Alar:  . CATARACT EXTRACTION W/PHACO Right 03/05/2017   Procedure: CATARACT EXTRACTION PHACO AND INTRAOCULAR LENS PLACEMENT (IOC);  Surgeon: Leandrew Koyanagi, MD;  Location: [facility] ORS;  Service: Ophthalmology;  Laterality: Right;  [place] 00:58.5 AP% 10.6 CDE 6.20 Fluid Pack lot # [id number]  . CATARACT EXTRACTION W/PHACO Left 04/15/2017   Procedure: CATARACT EXTRACTION PHACO AND INTRAOCULAR LENS PLACEMENT ([place])  Left;  Surgeon: Leandrew Koyanagi, MD;  Location: [place];  Service: Ophthalmology;  Laterality: Left;  . COLONOSCOPY WITH PROPOFOL N/A  09/15/2017   Procedure: COLONOSCOPY WITH PROPOFOL;  Surgeon: Lucilla Lame, MD;  Location: [facility] ENDOSCOPY;  Service: Endoscopy;  Laterality: N/A;  . COLONOSCOPY WITH PROPOFOL N/A 10/26/2018   Procedure: COLONOSCOPY WITH PROPOFOL;  Surgeon: Lucilla Lame, MD;  Location: [facility] ENDOSCOPY;  Service: Endoscopy;  Laterality: N/A;  . CORONARY ARTERY BYPASS GRAFT  1998   LIMA-LAD, SVG-OM, SVG-RPDA, SVG-DIAG  . CPET / MET  12/2012   Mild chronotropic incompetence - read 82% of predicted; also reduced effort; peak VO2 15.7 / 75% (did not reach Max effort) -- suggested ischemic response in last 1.5 minutes of exercise. Normal pulmonary function on PFTs but poor response to  . DOPPLER ECHOCARDIOGRAPHY  11/20/2010   EF =>55%;  LV norm mild aortic scelorosis  . ENDOVASCULAR REPAIR/STENT GRAFT N/A 05/18/2019   Procedure: ENDOVASCULAR REPAIR/STENT GRAFT;  Surgeon: Algernon Huxley, MD;  Location: [place] [facility];  Service: Cardiovascular;  Laterality: N/A;  . ESOPHAGOGASTRODUODENOSCOPY (EGD) WITH PROPOFOL N/A 09/15/2017   Procedure: ESOPHAGOGASTRODUODENOSCOPY (EGD) WITH PROPOFOL;  Surgeon: Lucilla Lame, MD;  Location: [facility] ENDOSCOPY;  Service: Endoscopy;  Laterality: N/A;  . FRACTURE SURGERY Right 03/19/2015   wrist  . HIATAL HERNIA REPAIR    . ILIAC ARTERY STENT  12/15/2005   PTA and direct stenting rgt and lft common iliac arteries  . KYPHOPLASTY N/A 04/05/2019   Procedure: KYPHOPLASTY T12, L3, L4 L5;  Surgeon: Hessie Knows, MD;  Location: [facility] ORS;  Service: Orthopedics;  Laterality: N/A;  . NM GATED MYOVIEW ([place] [facility])  12/2017   Low Risk - no ischemia or infarct.  EF > 65%. no RWMA  . NM MYOVIEW ([place] [facility])  11/11/2010; February 2016   a) TM STRESS----NORMAL PERFUSION ,EF 68%; b) EF 50-55%. Normal LV function. No significant ischemia or infarction.  . OPEN REDUCTION INTERNAL FIXATION (ORIF) DISTAL RADIAL FRACTURE Left 01/13/2019   Procedure: OPEN REDUCTION INTERNAL FIXATION (ORIF) DISTAL  RADIAL FRACTURE - LEFT - SLEEP APNEA;  Surgeon: Hessie Knows, MD;  Location: [facility] ORS;  Service: Orthopedics;  Laterality: Left;  . ORIF ELBOW FRACTURE Right 01/01/2017   Procedure: OPEN REDUCTION INTERNAL FIXATION (ORIF) ELBOW/OLECRANON FRACTURE;  Surgeon: Hessie Knows, MD;  Location: [facility] ORS;  Service: Orthopedics;  Laterality: Right;  . ORIF WRIST FRACTURE Right 03/19/2015   Procedure: OPEN REDUCTION INTERNAL FIXATION (ORIF) WRIST FRACTURE;  Surgeon: Hessie Knows, MD;  Location: [facility] ORS;  Service: Orthopedics;  Laterality: Right;  . THORACIC AORTA - CAROTID ANGIOGRAM  October 2007   Dr. 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 TRANSTHORACIC  ECHOCARDIOGRAM  February 2016   [facility]: Normal LV function. Dilated left atrium.    There were no vitals filed for this visit.  Subjective Assessment - 09/14/19 1020    Subjective  Patient presents with L foot pain, reports it has been worsening, pain is worsened with weightbearing and relieved with nonweightbearing. Back pain has been aggravated with foot pain with 3/10 pain reported.    Pertinent History  Patient referred to physical therapy for balance/strength issues s/p abdominal aortic aneurysm (AAA) without rupture repair on 05/18/19 .  Patient has been seen by this therapist in the past (last seen in march of 2020) Patient had a fall on 01/08/19, MRI [facility] dated 03/28/19 report acute compression fractures at T12, L3 and L4 as well as facet arthropathy at the lower lumbar levels and at L3-4 moderate central stenosis. Patient underwent injection on 10/6 at L4-5  and L5-S1. PMH includes AAA, arthritis, atherosclerotic heart disease, (s/p CABG), benign neoplasm of ascending/descending colon, COPD, CAD, dementia, dysrhythmia, frequent falls, GERD, hemorrhoid, HOH, HTN, PAF, prostate cancer. Monday through Friday have 3 people come in different times from 730 to 12pm. Uses a walker occasionally now    Currently in Pain?  Yes    Pain Score  3     Pain Location  Back    Pain Orientation  Lower    Pain Descriptors / Indicators  Aching    Pain Type  Chronic pain    Pain Onset  1 to 4 weeks ago    Pain Frequency  Constant    Multiple Pain Sites  Yes    Pain Score  8    Pain Location  Foot    Pain Orientation  Left    Pain Descriptors / Indicators  Aching    Pain Type  Acute pain    Pain Onset  1 to 4 weeks ago    Pain Frequency  Intermittent    Aggravating Factors   weightbearing    Pain Relieving Factors  nonweightbearing         Patient presents with L foot pain, reports it has been worsening, pain is worsened with weightbearing and relieved with nonweightbearing. Back pain has been  aggravated with foot pain with 3/10 pain reported.      Treatment:  Standing interventions requires CGA at all times for stability and decreased fall risk    Nustep Lvl 34 RPM > 618fr cardiovascular challenge x 4 minutes    supine with wedge under head:  -SAQ over bolster #3 ankle weight, 15x each LE   TrA ativation with green swiss ball pressing between knees and hands 12x 3 second holds   GTB hooklying abduction 15x   Posterior pelvic tilt, cueing for decreased muscle activation 12x   Heel slides 10x each LE ; R slight aggravation of back  LE rotation for low back pain relief.   Adduction ball squeeze in hooklying 12x 3 second holds  Hamstring stretch 60 seconds each LE, 2x 60 seconds  Hip flexion/knee to chest 10x each side  Seated: Swiss ball forward stretch 10x, GTB hamstring curl each LE 15x each LE  Seated marching with upright posture, arms crossed, 10x each LE for core stability 3lb ankle weights; LAQ 12x each LE   Vitals monitored throughout session during rest periods to ensure O2 saturation return to functional range prior to initiation of each exercise.      Pt educated throughout session about proper posture and technique with exercises. Improved exercise technique, movement at target joints, use of target muscles after min to mod verbal, visual, tactile cues.                   PT Education - 09/14/19 1008    Education provided  Yes    Education Details  exercise technique, body mechanics    Person(s) Educated  Patient    Methods  Explanation;Demonstration;Tactile cues;Verbal cues    Comprehension  Verbalized understanding;Returned demonstration;Verbal cues required;Tactile cues required       PT Short Term Goals - 08/09/19 1745      PT SHORT TERM GOAL #1   Title  Patient will be independent in home exercise program to improve strength/mobility for better functional independence with ADLs.    Baseline  10/16: HEP given 11/17: HEP  compliant    Time  4    Period  Weeks  Status  Achieved    Target Date  07/15/19      PT SHORT TERM GOAL #2   Title  Patient will deny any falls over past 4 weeks to demonstrate improved safety awareness at home and work.    Baseline  10/16: one major fall 11/17: no falls    Time  4    Period  Weeks    Status  Achieved    Target Date  07/15/19        PT Long Term Goals - 08/30/19 1011      PT LONG TERM GOAL #1   Title  Patient will increase BLE gross strength to 4+/5 as to improve functional strength for independent gait, increased standing tolerance and increased ADL ability.    Baseline  10/16: R gross 4-/5 with add/ext 3+/5 L grossly 3+/5 with hip add/ext 2+/5 11/17: R 4/5 add/ext; -/5 L 4-/5 hip add/ext 3/5 12/8: R: 4/5 L 4-/5 hip add/ext 3+/5    Time  8    Period  Weeks    Status  Partially Met    Target Date  10/04/19      PT LONG TERM GOAL #2   Title  Patient (> 68 years old) will complete five times sit to stand test in < 15 seconds indicating an increased LE strength and improved balance.    Baseline  10/16: 17 seconds with heavy use of hands on knees 11/17: 16 seconds one near LOB 12/8: 13 seconds one posterior LOB    Time  8    Period  Weeks    Status  Achieved      PT LONG TERM GOAL #3   Title  Patient will increase Berg Balance score by > 6 points (43/56)  to demonstrate decreased fall risk during functional activities.    Baseline  10/16: 37/56 11/17: 42/56 12/8: 47/56    Time  8    Period  Weeks    Status  Achieved      PT LONG TERM GOAL #4   Title  Patient will increase 10 meter walk test to >1.55ms as to improve gait speed for better community ambulation and to reduce fall risk.    Baseline  10/16: 0.91 w/o AD 11/17: 1.43 m/s with no AD    Time  8    Period  Weeks    Status  Achieved      PT LONG TERM GOAL #5   Title  Patient will increase ABC scale score >80% to demonstrate better functional mobility and better confidence with ADLs.    Baseline   10/16; 71% 11/17: 80% 12/8: 83%    Time  8    Period  Weeks    Status  Achieved      PT LONG TERM GOAL #6   Title  Patient will improve score on DGI to > or = 19/24 on the DGI to increase dynamic balance during mobility and to decrease risk of falls.    Baseline  12/8: perform next session 12/29: 18/24    Time  8    Period  Weeks    Status  Partially Met    Target Date  10/04/19      PT LONG TERM GOAL #7   Title  Patient will increase Berg Balance score by > 6 points (53/56)  to demonstrate decreased fall risk during functional activities.    Baseline  12/8; 47/56 12/29: 49/56    Time  8  Period  Weeks    Status  Partially Met    Target Date  10/04/19            Plan - 09/14/19 1228    Clinical Impression Statement  Patient requires seated and supine intervention this session due to pain with weightbearing on L foot. Patient advised to follow up with doctor and is agreeable.  The patient would benefit from continued skilled PT intervention to maximize mobility, safety, and functional abilities    Personal Factors and Comorbidities  Age;Comorbidity 3+;Fitness;Past/Current Experience;Sex;Social Background;Time since onset of injury/illness/exacerbation;Transportation    Comorbidities  AAA, arthritis, atherosclerotic heart disease, (s/p CABG), benign neoplasm of ascending/descending colon, COPD, CAD, dementia, dysrhythmia, frequent falls, GERD, hemorrhoid, HOH, HTN, PAF, prostate cancer.    Examination-Activity Limitations  Bathing;Bend;Carry;Dressing;Continence;Stairs;Squat;Reach Overhead;Locomotion Level;Lift;Stand;Transfers;Toileting    Examination-Participation Restrictions  Church;Cleaning;Community Activity;Driving;Interpersonal Relationship;Laundry;Volunteer;Shop;Personal Finances;Meal Prep;Yard Work    [employer] Potential  Fair    Clinical Impairments Affecting Rehab Potential  (+) good support system, high prior level of function (-) currently undergoing radiation therapy for  prostate cancer, memory deficits     PT Frequency  2x / week    PT Duration  8 weeks    PT Treatment/Interventions  ADLs/Self Care Home Management;Aquatic Therapy;Cryotherapy;Moist Heat;Traction;Ultrasound;Therapeutic activities;Functional mobility training;Stair training;Gait training;DME Instruction;Therapeutic exercise;Balance training;Neuromuscular re-education;Patient/family education;Manual techniques;Passive range of motion;Energy conservation;Vestibular;Taping    PT Next Visit Plan  progress dynamic stability    PT Home Exercise Plan  see above    Consulted and Agree with Plan of Care  Patient       Patient will benefit from skilled therapeutic intervention in order to improve the following deficits and impairments:  Abnormal gait, Cardiopulmonary status limiting activity, Decreased activity tolerance, Decreased balance, Decreased knowledge of precautions, Decreased endurance, Decreased coordination, Decreased cognition, Decreased knowledge of use of DME, Decreased mobility, Difficulty walking, Decreased safety awareness, Decreased strength, Impaired flexibility, Impaired perceived functional ability, Impaired sensation, Postural dysfunction, Improper body mechanics, Pain  Visit Diagnosis: Unsteadiness on feet  Muscle weakness (generalized)  Other abnormalities of gait and mobility  Difficulty in walking, not elsewhere classified     Problem List Patient Active Problem List   Diagnosis Date Noted  . Status post abdominal aortic aneurysm (AAA) repair 04/15/2019  . Candidiasis 02/22/2019  . Malignant neoplasm of descending colon ([place])   . Benign neoplasm of descending colon   . Benign neoplasm of transverse colon   . Do not intubate, cardiopulmonary resuscitation (CPR)-only code status 10/08/2018  . Prostate cancer ([place]) 02/25/2018  . Prostatic cyst 01/15/2018  . 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 ([place]) 09/15/2017  . Blood in stool   . Abnormal feces   . Stricture and  stenosis of esophagus   . Mild cognitive impairment 08/18/2017  . Senile purpura ([place]) 04/29/2017  . Anemia 04/29/2017  . Frequent falls 04/29/2017  . Acute respiratory failure ([place]) 12/17/2016  . Simple chronic bronchitis ([place]) 12/17/2016  . Benign localized hyperplasia of prostate with urinary obstruction 07/15/2016  . Elevated PSA 07/15/2016  . Incomplete emptying of bladder 07/15/2016  . Urge incontinence 07/15/2016  . Hypothyroidism 04/25/2016  . Macular degeneration 04/25/2016  . Erectile dysfunction due to arterial insufficiency   . Ischemic heart disease with chronotropic incompetence   . CN (constipation) 02/10/2015  . DD (diverticular disease) 02/10/2015  . Fatigue 02/10/2015  . Lichen planus 24/26/8341  . Restless leg 02/10/2015  . 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 B12 deficiency 02/10/2015  . COPD (chronic obstructive pulmonary disease) ([place]) 11/14/2014  .  Post Inflammatory Lung Changes 11/14/2014  . PAF (paroxysmal atrial fibrillation) ([facility]) CHA2DS2-VASc = 4. AC = Eliquis   . Insomnia 12/09/2013  . OSA on CPAP   . Dyspnea on exertion - -essentially resolved with BB dose reduction & wgt loss 03/29/2013  . Overweight (BMI 25.0-29.9) -- Wgt back up 01/17/2013  . Atherosclerotic heart disease of native coronary artery without angina pectoris - s/p CABG, occluded SVG-D1; patent LIMA-LAD, SVG-OM, SVG- RCA   . Essential hypertension   . Hyperlipidemia with target LDL less than 70   . Aorto-iliac disease ([place])   . BCC (basal cell carcinoma), eyelid 01/03/2013  . Allergic rhinitis 08/17/2009  . Adaptation reaction 05/28/2009  . Acid reflux 05/28/2009  . Arthritis, degenerative 05/28/2009  . 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 Carotid artery occlusion without infarction 06/01/2006  . PAD (peripheral artery disease) - bilateral common iliac stents 12/15/2005  . Aortoiliac occlusive disease ([place]) 11/30/2005  . Atherosclerotic heart disease of artery bypass  graft 09/01/1997   Janna Arch, PT, DPT   09/14/2019, 12:30 PM  [place] MAIN [facility] SERVICES [address] [place], [place], [address] Phone: [phone]   Fax:  [phone]  Name: [name] MRN: [id number] Date of Birth: September 04, 1933

## 2019-09-19 ENCOUNTER — Other Ambulatory Visit: Payer: Self-pay

## 2019-09-19 ENCOUNTER — Ambulatory Visit: Payer: Medicare Other

## 2019-09-19 DIAGNOSIS — R262 Difficulty in walking, not elsewhere classified: Secondary | ICD-10-CM

## 2019-09-19 DIAGNOSIS — R2681 Unsteadiness on feet: Secondary | ICD-10-CM

## 2019-09-19 DIAGNOSIS — R2689 Other abnormalities of gait and mobility: Secondary | ICD-10-CM

## 2019-09-19 DIAGNOSIS — M6281 Muscle weakness (generalized): Secondary | ICD-10-CM

## 2019-09-19 NOTE — Therapy (Signed)
[place] [facility] SERVICES [address] [place], [place], [address] Phone: [phone]   Fax:  [phone]  Physical Therapy Treatment  Patient Details  Name: [name] MRN: [id number] Date of Birth: May 08, 1934 Referring Provider (PT): Eulogio Ditch   Encounter Date: 09/19/2019  PT End of Session - 09/19/19 1013    Visit Number  25    Number of Visits  30    Date for PT Re-Evaluation  10/04/19    Authorization Type  next session 5/10 PN 08/30/19    PT Start Time  1010    PT Stop Time  1054    PT Time Calculation (min)  44 min    Equipment Utilized During Treatment  Gait belt    Activity Tolerance  Patient tolerated treatment well    Behavior During Therapy  [facility] for tasks assessed/performed       Past Medical History:  Diagnosis Date  . AAA (abdominal aortic aneurysm) ([place])   . Arthritis   . Atherosclerotic heart disease of native coronary artery without angina pectoris 1998   - s/p CABG, occluded SVG-D1; patent LIMA-LAD, SVG-OM, SVG- RCA  . Basal cell carcinoma of eyelid 01/03/2013   2019 - R side of Nose  . Benign neoplasm of ascending colon   . Benign neoplasm of descending colon   . Cancer [facility])    Skin cancer  . Colon polyps   . COPD (chronic obstructive pulmonary disease) ([place])   . Coronary artery disease   . Dementia ([place])   . Dysrhythmia    atrial fib  . Falls frequently   . GERD (gastroesophageal reflux disease)   . Hemorrhoid 02/10/2015  . History of hiatal hernia   . History of kidney stones   . HOH (hard of hearing)   . Hypertension   . Memory loss   . Osteoporosis   . PAF (paroxysmal atrial fibrillation) ([place])   . Prostate cancer ([place])   . Prostate disorder   . Sciatic pain    Chronic  . Sleep apnea    cpap  . Temporary cerebral vascular dysfunction 02/10/2015   Had negative work-up.  July, 2012.  No medication changes, had forgot them that day, got dehydrated.   . Urinary incontinence      Past Surgical History:  Procedure Laterality Date  . ABDOMINAL AORTIC ENDOVASCULAR STENT GRAFT Bilateral 05/18/2019   Procedure: ABDOMINAL AORTIC ENDOVASCULAR STENT GRAFT;  Surgeon: Algernon Huxley, MD;  Location: [facility] ORS;  Service: Vascular;  Laterality: Bilateral;  . ANKLE SURGERY    . Arch aortogram and carotid aortogram  06/02/2006   Dr Oneida Alar did surgery  . BASAL CELL CARCINOMA EXCISION  04/2016   Dermatology  . CARDIAC CATHETERIZATION  01/30/1998    (Dr. Linard Millers): Native LAD & mRCA CTO. 90% D1. Mod Cx. SVG-D1 CTO. SVG-RCA & SVG-OM along with LIMA-LAD patent;  LV NORMAL Fxn  . CAROTID ENDARTERECTOMY Left Oct. 29, 2007   Dr. Oneida Alar:  . CATARACT EXTRACTION W/PHACO Right 03/05/2017   Procedure: CATARACT EXTRACTION PHACO AND INTRAOCULAR LENS PLACEMENT (IOC);  Surgeon: Leandrew Koyanagi, MD;  Location: [facility] ORS;  Service: Ophthalmology;  Laterality: Right;  [place] 00:58.5 AP% 10.6 CDE 6.20 Fluid Pack lot # [id number]  . CATARACT EXTRACTION W/PHACO Left 04/15/2017   Procedure: CATARACT EXTRACTION PHACO AND INTRAOCULAR LENS PLACEMENT ([place])  Left;  Surgeon: Leandrew Koyanagi, MD;  Location: [place];  Service: Ophthalmology;  Laterality: Left;  . COLONOSCOPY WITH PROPOFOL N/A  09/15/2017   Procedure: COLONOSCOPY WITH PROPOFOL;  Surgeon: Lucilla Lame, MD;  Location: [facility] ENDOSCOPY;  Service: Endoscopy;  Laterality: N/A;  . COLONOSCOPY WITH PROPOFOL N/A 10/26/2018   Procedure: COLONOSCOPY WITH PROPOFOL;  Surgeon: Lucilla Lame, MD;  Location: [facility] ENDOSCOPY;  Service: Endoscopy;  Laterality: N/A;  . CORONARY ARTERY BYPASS GRAFT  1998   LIMA-LAD, SVG-OM, SVG-RPDA, SVG-DIAG  . CPET / MET  12/2012   Mild chronotropic incompetence - read 82% of predicted; also reduced effort; peak VO2 15.7 / 75% (did not reach Max effort) -- suggested ischemic response in last 1.5 minutes of exercise. Normal pulmonary function on PFTs but poor response to  . DOPPLER ECHOCARDIOGRAPHY  11/20/2010   EF =>55%;  LV norm mild aortic scelorosis  . ENDOVASCULAR REPAIR/STENT GRAFT N/A 05/18/2019   Procedure: ENDOVASCULAR REPAIR/STENT GRAFT;  Surgeon: Algernon Huxley, MD;  Location: [place] [facility];  Service: Cardiovascular;  Laterality: N/A;  . ESOPHAGOGASTRODUODENOSCOPY (EGD) WITH PROPOFOL N/A 09/15/2017   Procedure: ESOPHAGOGASTRODUODENOSCOPY (EGD) WITH PROPOFOL;  Surgeon: Lucilla Lame, MD;  Location: [facility] ENDOSCOPY;  Service: Endoscopy;  Laterality: N/A;  . FRACTURE SURGERY Right 03/19/2015   wrist  . HIATAL HERNIA REPAIR    . ILIAC ARTERY STENT  12/15/2005   PTA and direct stenting rgt and lft common iliac arteries  . KYPHOPLASTY N/A 04/05/2019   Procedure: KYPHOPLASTY T12, L3, L4 L5;  Surgeon: Hessie Knows, MD;  Location: [facility] ORS;  Service: Orthopedics;  Laterality: N/A;  . NM GATED MYOVIEW ([place] [facility])  12/2017   Low Risk - no ischemia or infarct.  EF > 65%. no RWMA  . NM MYOVIEW ([place] [facility])  11/11/2010; February 2016   a) TM STRESS----NORMAL PERFUSION ,EF 68%; b) EF 50-55%. Normal LV function. No significant ischemia or infarction.  . OPEN REDUCTION INTERNAL FIXATION (ORIF) DISTAL RADIAL FRACTURE Left 01/13/2019   Procedure: OPEN REDUCTION INTERNAL FIXATION (ORIF) DISTAL  RADIAL FRACTURE - LEFT - SLEEP APNEA;  Surgeon: Hessie Knows, MD;  Location: [facility] ORS;  Service: Orthopedics;  Laterality: Left;  . ORIF ELBOW FRACTURE Right 01/01/2017   Procedure: OPEN REDUCTION INTERNAL FIXATION (ORIF) ELBOW/OLECRANON FRACTURE;  Surgeon: Hessie Knows, MD;  Location: [facility] ORS;  Service: Orthopedics;  Laterality: Right;  . ORIF WRIST FRACTURE Right 03/19/2015   Procedure: OPEN REDUCTION INTERNAL FIXATION (ORIF) WRIST FRACTURE;  Surgeon: Hessie Knows, MD;  Location: [facility] ORS;  Service: Orthopedics;  Laterality: Right;  . THORACIC AORTA - CAROTID ANGIOGRAM  October 2007   Dr. 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 TRANSTHORACIC  ECHOCARDIOGRAM  February 2016   [facility]: Normal LV function. Dilated left atrium.    There were no vitals filed for this visit.  Subjective Assessment - 09/19/19 1011    Subjective  Patient reports no falls or LOB since last session. Reports occasional foot pain in weightbearing.    Pertinent History  Patient referred to physical therapy for balance/strength issues s/p abdominal aortic aneurysm (AAA) without rupture repair on 05/18/19 .  Patient has been seen by this therapist in the past (last seen in march of 2020) Patient had a fall on 01/08/19, MRI [facility] dated 03/28/19 report acute compression fractures at T12, L3 and L4 as well as facet arthropathy at the lower lumbar levels and at L3-4 moderate central stenosis. Patient underwent injection on 10/6 at L4-5 and L5-S1. PMH includes AAA, arthritis, atherosclerotic heart disease, (s/p CABG), benign neoplasm of ascending/descending colon, COPD,  CAD, dementia, dysrhythmia, frequent falls, GERD, hemorrhoid, HOH, HTN, PAF, prostate cancer. Monday through Friday have 3 people come in different times from 730 to 12pm. Uses a walker occasionally now    Currently in Pain?  Yes    Pain Score  3     Pain Location  Back    Pain Orientation  Lower    Pain Descriptors / Indicators  Aching    Pain Type  Chronic pain    Pain Onset  1 to 4 weeks ago    Pain Frequency  Constant    Pain Score  7    Pain Location  Foot    Pain Orientation  Left    Pain Descriptors / Indicators  Aching    Pain Type  Acute pain    Pain Onset  1 to 4 weeks ago    Pain Frequency  Intermittent         Nustep Lvl 3 RPM> 60 for 4 minutes for cardiovascular challenge         supine with wedge under head:  -SAQ over bolster #3 ankle weight, 15x each LE   Marches leg to chest, focus on slow controlled movements 10x each LE; second set with 3lb ankle weight.   SLR 10x each LE, cueing for body mechanics and sequencing  Straight leg leg abduction 10x each side ; cueing for  opp LE in hooklying.   TrA ativation with green swiss ball pressing between knees and hands 12x 10second holds   GTB hooklying abduction 15x x2 trials   Posterior pelvic tilt, cueing for decreased muscle activation 12x      Seated: Swiss ball forward stretch 10x, GTB hamstring curl with heel raise at starting position 10x each LE for gait reproduction and foot clearance.  Sit to stand with rainbow ball between knees 10x ; very challenging for patient with frequent loss of ball control.   Standing airex pad near treadmill bars.  -toe taps with UE support onto 7" step. Max cueing for sequencing and slowing of velocity. 12x each LE  -static stand with narrow BOS and no UE support 30 seconds x 2 trials   Vitals monitored throughout sessionduring rest periods to ensure O2 saturation return to functional range prior to initiation of each exercise.   Pt educated throughout session about proper posture and technique with exercises. Improved exercise technique, movement at target joints, use of target muscles after min to mod verbal, visual, tactile cues.                PT Education - 09/19/19 1012    Education provided  Yes    Education Details  exercise technique, body mechanics    Person(s) Educated  Patient    Methods  Explanation;Demonstration;Tactile cues;Verbal cues    Comprehension  Verbalized understanding;Returned demonstration;Verbal cues required;Tactile cues required       PT Short Term Goals - 08/09/19 1745      PT SHORT TERM GOAL #1   Title  Patient will be independent in home exercise program to improve strength/mobility for better functional independence with ADLs.    Baseline  10/16: HEP given 11/17: HEP compliant    Time  4    Period  Weeks    Status  Achieved    Target Date  07/15/19      PT SHORT TERM GOAL #2   Title  Patient will deny any falls over past 4 weeks to demonstrate improved safety awareness at home and work.  Baseline   10/16: one major fall 11/17: no falls    Time  4    Period  Weeks    Status  Achieved    Target Date  07/15/19        PT Long Term Goals - 08/30/19 1011      PT LONG TERM GOAL #1   Title  Patient will increase BLE gross strength to 4+/5 as to improve functional strength for independent gait, increased standing tolerance and increased ADL ability.    Baseline  10/16: R gross 4-/5 with add/ext 3+/5 L grossly 3+/5 with hip add/ext 2+/5 11/17: R 4/5 add/ext; -/5 L 4-/5 hip add/ext 3/5 12/8: R: 4/5 L 4-/5 hip add/ext 3+/5    Time  8    Period  Weeks    Status  Partially Met    Target Date  10/04/19      PT LONG TERM GOAL #2   Title  Patient (> 66 years old) will complete five times sit to stand test in < 15 seconds indicating an increased LE strength and improved balance.    Baseline  10/16: 17 seconds with heavy use of hands on knees 11/17: 16 seconds one near LOB 12/8: 13 seconds one posterior LOB    Time  8    Period  Weeks    Status  Achieved      PT LONG TERM GOAL #3   Title  Patient will increase Berg Balance score by > 6 points (43/56)  to demonstrate decreased fall risk during functional activities.    Baseline  10/16: 37/56 11/17: 42/56 12/8: 47/56    Time  8    Period  Weeks    Status  Achieved      PT LONG TERM GOAL #4   Title  Patient will increase 10 meter walk test to >1.33ms as to improve gait speed for better community ambulation and to reduce fall risk.    Baseline  10/16: 0.91 w/o AD 11/17: 1.43 m/s with no AD    Time  8    Period  Weeks    Status  Achieved      PT LONG TERM GOAL #5   Title  Patient will increase ABC scale score >80% to demonstrate better functional mobility and better confidence with ADLs.    Baseline  10/16; 71% 11/17: 80% 12/8: 83%    Time  8    Period  Weeks    Status  Achieved      PT LONG TERM GOAL #6   Title  Patient will improve score on DGI to > or = 19/24 on the DGI to increase dynamic balance during mobility and to decrease risk  of falls.    Baseline  12/8: perform next session 12/29: 18/24    Time  8    Period  Weeks    Status  Partially Met    Target Date  10/04/19      PT LONG TERM GOAL #7   Title  Patient will increase Berg Balance score by > 6 points (53/56)  to demonstrate decreased fall risk during functional activities.    Baseline  12/8; 47/56 12/29: 49/56    Time  8    Period  Weeks    Status  Partially Met    Target Date  10/04/19            Plan - 09/19/19 1033    Clinical Impression Statement  Patient presents to physical therapy  with excellent motivation. Foot pain continues to be present in weightbearing limiting standing interventions. Continued progression of nonweightbearing positions for strengthening interventions tolerated. Multimodal cueing required throughout session for optimal body mechanics. The patient would benefit from continued skilled PT intervention to maximize mobility, safety, and functional abilities    Personal Factors and Comorbidities  Age;Comorbidity 3+;Fitness;Past/Current Experience;Sex;Social Background;Time since onset of injury/illness/exacerbation;Transportation    Comorbidities  AAA, arthritis, atherosclerotic heart disease, (s/p CABG), benign neoplasm of ascending/descending colon, COPD, CAD, dementia, dysrhythmia, frequent falls, GERD, hemorrhoid, HOH, HTN, PAF, prostate cancer.    Examination-Activity Limitations  Bathing;Bend;Carry;Dressing;Continence;Stairs;Squat;Reach Overhead;Locomotion Level;Lift;Stand;Transfers;Toileting    Examination-Participation Restrictions  Church;Cleaning;Community Activity;Driving;Interpersonal Relationship;Laundry;Volunteer;Shop;Personal Finances;Meal Prep;Yard Work    [employer] Potential  Fair    Clinical Impairments Affecting Rehab Potential  (+) good support system, high prior level of function (-) currently undergoing radiation therapy for prostate cancer, memory deficits     PT Frequency  2x / week    PT Duration  8 weeks    PT  Treatment/Interventions  ADLs/Self Care Home Management;Aquatic Therapy;Cryotherapy;Moist Heat;Traction;Ultrasound;Therapeutic activities;Functional mobility training;Stair training;Gait training;DME Instruction;Therapeutic exercise;Balance training;Neuromuscular re-education;Patient/family education;Manual techniques;Passive range of motion;Energy conservation;Vestibular;Taping    PT Next Visit Plan  progress dynamic stability    PT Home Exercise Plan  see above    Consulted and Agree with Plan of Care  Patient       Patient will benefit from skilled therapeutic intervention in order to improve the following deficits and impairments:  Abnormal gait, Cardiopulmonary status limiting activity, Decreased activity tolerance, Decreased balance, Decreased knowledge of precautions, Decreased endurance, Decreased coordination, Decreased cognition, Decreased knowledge of use of DME, Decreased mobility, Difficulty walking, Decreased safety awareness, Decreased strength, Impaired flexibility, Impaired perceived functional ability, Impaired sensation, Postural dysfunction, Improper body mechanics, Pain  Visit Diagnosis: Unsteadiness on feet  Muscle weakness (generalized)  Other abnormalities of gait and mobility  Difficulty in walking, not elsewhere classified     Problem List Patient Active Problem List   Diagnosis Date Noted  . Status post abdominal aortic aneurysm (AAA) repair 04/15/2019  . Candidiasis 02/22/2019  . Malignant neoplasm of descending colon ([place])   . Benign neoplasm of descending colon   . Benign neoplasm of transverse colon   . Do not intubate, cardiopulmonary resuscitation (CPR)-only code status 10/08/2018  . Prostate cancer ([place]) 02/25/2018  . Prostatic cyst 01/15/2018  . 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 ([place]) 09/15/2017  . Blood in stool   . Abnormal feces   . Stricture and stenosis of esophagus   . Mild cognitive impairment 08/18/2017  . Senile purpura ([place]) 04/29/2017   . Anemia 04/29/2017  . Frequent falls 04/29/2017  . Acute respiratory failure ([place]) 12/17/2016  . Simple chronic bronchitis ([place]) 12/17/2016  . Benign localized hyperplasia of prostate with urinary obstruction 07/15/2016  . Elevated PSA 07/15/2016  . Incomplete emptying of bladder 07/15/2016  . Urge incontinence 07/15/2016  . Hypothyroidism 04/25/2016  . Macular degeneration 04/25/2016  . Erectile dysfunction due to arterial insufficiency   . Ischemic heart disease with chronotropic incompetence   . CN (constipation) 02/10/2015  . DD (diverticular disease) 02/10/2015  . Fatigue 02/10/2015  . Lichen planus 26/33/3545  . Restless leg 02/10/2015  . 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 B12 deficiency 02/10/2015  . COPD (chronic obstructive pulmonary disease) ([place]) 11/14/2014  . Post Inflammatory Lung Changes 11/14/2014  . PAF (paroxysmal atrial fibrillation) ([facility]) CHA2DS2-VASc = 4. AC = Eliquis   . Insomnia 12/09/2013  . OSA on CPAP   . Dyspnea on  exertion - -essentially resolved with BB dose reduction & wgt loss 03/29/2013  . Overweight (BMI 25.0-29.9) -- Wgt back up 01/17/2013  . Atherosclerotic heart disease of native coronary artery without angina pectoris - s/p CABG, occluded SVG-D1; patent LIMA-LAD, SVG-OM, SVG- RCA   . Essential hypertension   . Hyperlipidemia with target LDL less than 70   . Aorto-iliac disease ([place])   . BCC (basal cell carcinoma), eyelid 01/03/2013  . Allergic rhinitis 08/17/2009  . Adaptation reaction 05/28/2009  . Acid reflux 05/28/2009  . Arthritis, degenerative 05/28/2009  . 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 Carotid artery occlusion without infarction 06/01/2006  . PAD (peripheral artery disease) - bilateral common iliac stents 12/15/2005  . Aortoiliac occlusive disease ([place]) 11/30/2005  . Atherosclerotic heart disease of artery bypass graft 09/01/1997   Janna Arch, PT, DPT   09/19/2019, 10:59 AM  [place] MAIN [facility] SERVICES [address] [place], [place], [address] Phone: [phone]   Fax:  [phone]  Name: [name] MRN: [id number] Date of Birth: April 21, 1934

## 2019-09-21 ENCOUNTER — Ambulatory Visit: Payer: Medicare Other

## 2019-09-21 ENCOUNTER — Other Ambulatory Visit: Payer: Self-pay

## 2019-09-21 DIAGNOSIS — R2681 Unsteadiness on feet: Secondary | ICD-10-CM | POA: Diagnosis not present

## 2019-09-21 DIAGNOSIS — M6281 Muscle weakness (generalized): Secondary | ICD-10-CM

## 2019-09-21 DIAGNOSIS — R2689 Other abnormalities of gait and mobility: Secondary | ICD-10-CM

## 2019-09-21 DIAGNOSIS — R262 Difficulty in walking, not elsewhere classified: Secondary | ICD-10-CM

## 2019-09-21 NOTE — Therapy (Signed)
[place] MAIN [facility] SERVICES [address] [place], [place], [address] Phone: [phone]   Fax:  [phone]  Physical Therapy Treatment  Patient Details  Name: [name] MRN: [id number] Date of Birth: Oct 23, 1933 Referring Provider (PT): Eulogio Ditch   Encounter Date: 09/21/2019  PT End of Session - 09/21/19 1008    Visit Number  26    Number of Visits  30    Date for PT Re-Evaluation  10/04/19    Authorization Type  6/10 PN    PT Start Time  1010    PT Stop Time  1054    PT Time Calculation (min)  44 min    Equipment Utilized During Treatment  Gait belt    Activity Tolerance  Patient tolerated treatment well    Behavior During Therapy  [facility] for tasks assessed/performed       Past Medical History:  Diagnosis Date  . AAA (abdominal aortic aneurysm) ([place])   . Arthritis   . Atherosclerotic heart disease of native coronary artery without angina pectoris 1998   - s/p CABG, occluded SVG-D1; patent LIMA-LAD, SVG-OM, SVG- RCA  . Basal cell carcinoma of eyelid 01/03/2013   2019 - R side of Nose  . Benign neoplasm of ascending colon   . Benign neoplasm of descending colon   . Cancer [facility])    Skin cancer  . Colon polyps   . COPD (chronic obstructive pulmonary disease) ([place])   . Coronary artery disease   . Dementia ([place])   . Dysrhythmia    atrial fib  . Falls frequently   . GERD (gastroesophageal reflux disease)   . Hemorrhoid 02/10/2015  . History of hiatal hernia   . History of kidney stones   . HOH (hard of hearing)   . Hypertension   . Memory loss   . Osteoporosis   . PAF (paroxysmal atrial fibrillation) ([place])   . Prostate cancer ([place])   . Prostate disorder   . Sciatic pain    Chronic  . Sleep apnea    cpap  . Temporary cerebral vascular dysfunction 02/10/2015   Had negative work-up.  July, 2012.  No medication changes, had forgot them that day, got dehydrated.   . Urinary incontinence     Past Surgical History:   Procedure Laterality Date  . ABDOMINAL AORTIC ENDOVASCULAR STENT GRAFT Bilateral 05/18/2019   Procedure: ABDOMINAL AORTIC ENDOVASCULAR STENT GRAFT;  Surgeon: Algernon Huxley, MD;  Location: [facility] ORS;  Service: Vascular;  Laterality: Bilateral;  . ANKLE SURGERY    . Arch aortogram and carotid aortogram  06/02/2006   Dr Oneida Alar did surgery  . BASAL CELL CARCINOMA EXCISION  04/2016   Dermatology  . CARDIAC CATHETERIZATION  01/30/1998    (Dr. Linard Millers): Native LAD & mRCA CTO. 90% D1. Mod Cx. SVG-D1 CTO. SVG-RCA & SVG-OM along with LIMA-LAD patent;  LV NORMAL Fxn  . CAROTID ENDARTERECTOMY Left Oct. 29, 2007   Dr. Oneida Alar:  . CATARACT EXTRACTION W/PHACO Right 03/05/2017   Procedure: CATARACT EXTRACTION PHACO AND INTRAOCULAR LENS PLACEMENT (IOC);  Surgeon: Leandrew Koyanagi, MD;  Location: [facility] ORS;  Service: Ophthalmology;  Laterality: Right;  [place] 00:58.5 AP% 10.6 CDE 6.20 Fluid Pack lot # [id number]  . CATARACT EXTRACTION W/PHACO Left 04/15/2017   Procedure: CATARACT EXTRACTION PHACO AND INTRAOCULAR LENS PLACEMENT ([place])  Left;  Surgeon: Leandrew Koyanagi, MD;  Location: [place];  Service: Ophthalmology;  Laterality: Left;  . COLONOSCOPY WITH PROPOFOL N/A 09/15/2017  Procedure: COLONOSCOPY WITH PROPOFOL;  Surgeon: Lucilla Lame, MD;  Location: [facility] ENDOSCOPY;  Service: Endoscopy;  Laterality: N/A;  . COLONOSCOPY WITH PROPOFOL N/A 10/26/2018   Procedure: COLONOSCOPY WITH PROPOFOL;  Surgeon: Lucilla Lame, MD;  Location: [facility] ENDOSCOPY;  Service: Endoscopy;  Laterality: N/A;  . CORONARY ARTERY BYPASS GRAFT  1998   LIMA-LAD, SVG-OM, SVG-RPDA, SVG-DIAG  . CPET / MET  12/2012   Mild chronotropic incompetence - read 82% of predicted; also reduced effort; peak VO2 15.7 / 75% (did not reach Max effort) -- suggested ischemic response in last 1.5 minutes of exercise. Normal pulmonary function on PFTs but poor response to  . DOPPLER ECHOCARDIOGRAPHY  11/20/2010   EF =>55%; LV norm mild aortic  scelorosis  . ENDOVASCULAR REPAIR/STENT GRAFT N/A 05/18/2019   Procedure: ENDOVASCULAR REPAIR/STENT GRAFT;  Surgeon: Algernon Huxley, MD;  Location: [place] CV LAB;  Service: Cardiovascular;  Laterality: N/A;  . ESOPHAGOGASTRODUODENOSCOPY (EGD) WITH PROPOFOL N/A 09/15/2017   Procedure: ESOPHAGOGASTRODUODENOSCOPY (EGD) WITH PROPOFOL;  Surgeon: Lucilla Lame, MD;  Location: [facility] ENDOSCOPY;  Service: Endoscopy;  Laterality: N/A;  . FRACTURE SURGERY Right 03/19/2015   wrist  . HIATAL HERNIA REPAIR    . ILIAC ARTERY STENT  12/15/2005   PTA and direct stenting rgt and lft common iliac arteries  . KYPHOPLASTY N/A 04/05/2019   Procedure: KYPHOPLASTY T12, L3, L4 L5;  Surgeon: Hessie Knows, MD;  Location: [facility] ORS;  Service: Orthopedics;  Laterality: N/A;  . NM GATED MYOVIEW ([place] HX)  12/2017   Low Risk - no ischemia or infarct.  EF > 65%. no RWMA  . NM MYOVIEW ([place] HX)  11/11/2010; February 2016   a) TM STRESS----NORMAL PERFUSION ,EF 68%; b) EF 50-55%. Normal LV function. No significant ischemia or infarction.  . OPEN REDUCTION INTERNAL FIXATION (ORIF) DISTAL RADIAL FRACTURE Left 01/13/2019   Procedure: OPEN REDUCTION INTERNAL FIXATION (ORIF) DISTAL  RADIAL FRACTURE - LEFT - SLEEP APNEA;  Surgeon: Hessie Knows, MD;  Location: [facility] ORS;  Service: Orthopedics;  Laterality: Left;  . ORIF ELBOW FRACTURE Right 01/01/2017   Procedure: OPEN REDUCTION INTERNAL FIXATION (ORIF) ELBOW/OLECRANON FRACTURE;  Surgeon: Hessie Knows, MD;  Location: [facility] ORS;  Service: Orthopedics;  Laterality: Right;  . ORIF WRIST FRACTURE Right 03/19/2015   Procedure: OPEN REDUCTION INTERNAL FIXATION (ORIF) WRIST FRACTURE;  Surgeon: Hessie Knows, MD;  Location: [facility] ORS;  Service: Orthopedics;  Laterality: Right;  . THORACIC AORTA - CAROTID ANGIOGRAM  October 2007   Dr. 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 TRANSTHORACIC ECHOCARDIOGRAM  February  2016   [facility]: Normal LV function. Dilated left atrium.    There were no vitals filed for this visit.  Subjective Assessment - 09/21/19 1007    Subjective  Patient reported that he was up early today for his first covid vaccine. Stated he slept well, but today is experiencing increased low back pain.    Pertinent History  Patient referred to physical therapy for balance/strength issues s/p abdominal aortic aneurysm (AAA) without rupture repair on 05/18/19 .  Patient has been seen by this therapist in the past (last seen in march of 2020) Patient had a fall on 01/08/19, MRI from [facility] dated 03/28/19 report acute compression fractures at T12, L3 and L4 as well as facet arthropathy at the lower lumbar levels and at L3-4 moderate central stenosis. Patient underwent injection on 10/6 at L4-5 and L5-S1. PMH includes AAA, arthritis, atherosclerotic heart disease, (s/p  CABG), benign neoplasm of ascending/descending colon, COPD, CAD, dementia, dysrhythmia, frequent falls, GERD, hemorrhoid, HOH, HTN, PAF, prostate cancer. Monday through Friday have 3 people come in different times from 730 to 12pm. Uses a walker occasionally now    Limitations  Walking;Standing;House hold activities;Other (comment);Lifting    How long can you sit comfortably?  20 minutes before back is painful    How long can you stand comfortably?  hour    How long can you walk comfortably?  using a walker about 30 minutes    Patient Stated Goals  patient would like to improve his balance and strenght.    Currently in Pain?  Yes    Pain Score  5     Pain Location  Back    Pain Orientation  Mid;Lower    Pain Descriptors / Indicators  Aching;Constant    Pain Type  Chronic pain    Pain Onset  1 to 4 weeks ago       TREATMENT:  Nustep Lvl 3 RPM> 60 for 4 minutes for cardiovascular challenge     Therapeutic exercise: Verbal/tactile cues given, pt challenged by coordinating motions. Performed to improve LE, core, hip, and functional strength    supine: -SAQ over bolster #3 ankle weight, 15x each LE  Marches leg to chest, focus on slow controlled movements x15 ea LE LTR with PT assist/overpressure x10 with cues for breath TrA ativation with green swiss ball pressing between knees and hands 12x 10second holds  GTB hooklying abduction 15x x2 Posterior pelvic tilt, cueing for decreased muscle activation 12x     Seated: Swiss ball forward and lateral stretch 10x x 5-10 second hold Seated hamstring stretch foot on step 2x30sec ea GTB hamstring curl with heel raise at starting position 10x each LE for gait reproduction and foot clearance.   Sit to stand with rainbow ball between knees 10x ; very challenging for patient,   Log rolling technique for supine to sit transfer, pt able to performed at quickened pace, no assist needed or cueing needed fore technique. Encouraged to perform at home to decrease back strain.      Pt educated throughout session about proper posture and technique with exercises. Improved exercise technique, movement at target joints, use of target muscles after min to mod verbal, visual, tactile cues.   Pt response/clinical impression: The patient reported to therapy with increased low back pain, session modified accordingly. Pt reported at end of session improvement in low back pain, 4/10. Patient most challenged this session by coordination of exercise form/technique. One step commands were most successful as well as tactile cueing. The patient would benefit from further skilled PT intervention to return to progression as able to maximize functional abilities.      PT Education - 09/21/19 1007    Education provided  Yes    Education Details  exercise technique, body mechanics    Person(s) Educated  Patient    Methods  Explanation;Demonstration;Tactile cues;Verbal cues    Comprehension  Verbalized understanding;Returned demonstration;Verbal cues required;Tactile cues required;Need further instruction       PT  Short Term Goals - 08/09/19 1745      PT SHORT TERM GOAL #1   Title  Patient will be independent in home exercise program to improve strength/mobility for better functional independence with ADLs.    Baseline  10/16: HEP given 11/17: HEP compliant    Time  4    Period  Weeks    Status  Achieved  Target Date  07/15/19      PT SHORT TERM GOAL #2   Title  Patient will deny any falls over past 4 weeks to demonstrate improved safety awareness at home and work.    Baseline  10/16: one major fall 11/17: no falls    Time  4    Period  Weeks    Status  Achieved    Target Date  07/15/19        PT Long Term Goals - 08/30/19 1011      PT LONG TERM GOAL #1   Title  Patient will increase BLE gross strength to 4+/5 as to improve functional strength for independent gait, increased standing tolerance and increased ADL ability.    Baseline  10/16: R gross 4-/5 with add/ext 3+/5 L grossly 3+/5 with hip add/ext 2+/5 11/17: R 4/5 add/ext; -/5 L 4-/5 hip add/ext 3/5 12/8: R: 4/5 L 4-/5 hip add/ext 3+/5    Time  8    Period  Weeks    Status  Partially Met    Target Date  10/04/19      PT LONG TERM GOAL #2   Title  Patient (> 40 years old) will complete five times sit to stand test in < 15 seconds indicating an increased LE strength and improved balance.    Baseline  10/16: 17 seconds with heavy use of hands on knees 11/17: 16 seconds one near LOB 12/8: 13 seconds one posterior LOB    Time  8    Period  Weeks    Status  Achieved      PT LONG TERM GOAL #3   Title  Patient will increase Berg Balance score by > 6 points (43/56)  to demonstrate decreased fall risk during functional activities.    Baseline  10/16: 37/56 11/17: 42/56 12/8: 47/56    Time  8    Period  Weeks    Status  Achieved      PT LONG TERM GOAL #4   Title  Patient will increase 10 meter walk test to >1.51ms as to improve gait speed for better community ambulation and to reduce fall risk.    Baseline  10/16: 0.91 w/o AD 11/17:  1.43 m/s with no AD    Time  8    Period  Weeks    Status  Achieved      PT LONG TERM GOAL #5   Title  Patient will increase ABC scale score >80% to demonstrate better functional mobility and better confidence with ADLs.    Baseline  10/16; 71% 11/17: 80% 12/8: 83%    Time  8    Period  Weeks    Status  Achieved      PT LONG TERM GOAL #6   Title  Patient will improve score on DGI to > or = 19/24 on the DGI to increase dynamic balance during mobility and to decrease risk of falls.    Baseline  12/8: perform next session 12/29: 18/24    Time  8    Period  Weeks    Status  Partially Met    Target Date  10/04/19      PT LONG TERM GOAL #7   Title  Patient will increase Berg Balance score by > 6 points (53/56)  to demonstrate decreased fall risk during functional activities.    Baseline  12/8; 47/56 12/29: 49/56    Time  8    Period  Weeks  Status  Partially Met    Target Date  10/04/19            Plan - 09/21/19 1008    Clinical Impression Statement  The patient reported to therapy with increased low back pain, session modified accordingly. Pt reported at end of session improvement in low back pain, 4/10. Patient most challenged this session by coordination of exercise form/technique. One step commands were most successful as well as tactile cueing. The patient would benefit from further skilled PT intervention to return to progression as able to maximize functional abilities.    Personal Factors and Comorbidities  Age;Comorbidity 3+;Fitness;Past/Current Experience;Sex;Social Background;Time since onset of injury/illness/exacerbation;Transportation    Comorbidities  AAA, arthritis, atherosclerotic heart disease, (s/p CABG), benign neoplasm of ascending/descending colon, COPD, CAD, dementia, dysrhythmia, frequent falls, GERD, hemorrhoid, HOH, HTN, PAF, prostate cancer.    Examination-Activity Limitations  Bathing;Bend;Carry;Dressing;Continence;Stairs;Squat;Reach Overhead;Locomotion  Level;Lift;Stand;Transfers;Toileting    Examination-Participation Restrictions  Church;Cleaning;Community Activity;Driving;Interpersonal Relationship;Laundry;Volunteer;Shop;Personal Finances;Meal Prep;Yard Work    [employer] Potential  Fair    Clinical Impairments Affecting Rehab Potential  (+) good support system, high prior level of function (-) currently undergoing radiation therapy for prostate cancer, memory deficits     PT Frequency  2x / week    PT Duration  8 weeks    PT Treatment/Interventions  ADLs/Self Care Home Management;Aquatic Therapy;Cryotherapy;Moist Heat;Traction;Ultrasound;Therapeutic activities;Functional mobility training;Stair training;Gait training;DME Instruction;Therapeutic exercise;Balance training;Neuromuscular re-education;Patient/family education;Manual techniques;Passive range of motion;Energy conservation;Vestibular;Taping    PT Next Visit Plan  progress dynamic stability    PT Home Exercise Plan  see above    Consulted and Agree with Plan of Care  Patient       Patient will benefit from skilled therapeutic intervention in order to improve the following deficits and impairments:  Abnormal gait, Cardiopulmonary status limiting activity, Decreased activity tolerance, Decreased balance, Decreased knowledge of precautions, Decreased endurance, Decreased coordination, Decreased cognition, Decreased knowledge of use of DME, Decreased mobility, Difficulty walking, Decreased safety awareness, Decreased strength, Impaired flexibility, Impaired perceived functional ability, Impaired sensation, Postural dysfunction, Improper body mechanics, Pain  Visit Diagnosis: Unsteadiness on feet  Muscle weakness (generalized)  Other abnormalities of gait and mobility  Difficulty in walking, not elsewhere classified     Problem List Patient Active Problem List   Diagnosis Date Noted  . Status post abdominal aortic aneurysm (AAA) repair 04/15/2019  . Candidiasis 02/22/2019  .  Malignant neoplasm of descending colon ([place])   . Benign neoplasm of descending colon   . Benign neoplasm of transverse colon   . Do not intubate, cardiopulmonary resuscitation (CPR)-only code status 10/08/2018  . Prostate cancer ([place]) 02/25/2018  . Prostatic cyst 01/15/2018  . 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 ([place]) 09/15/2017  . Blood in stool   . Abnormal feces   . Stricture and stenosis of esophagus   . Mild cognitive impairment 08/18/2017  . Senile purpura ([place]) 04/29/2017  . Anemia 04/29/2017  . Frequent falls 04/29/2017  . Acute respiratory failure ([place]) 12/17/2016  . Simple chronic bronchitis ([place]) 12/17/2016  . Benign localized hyperplasia of prostate with urinary obstruction 07/15/2016  . Elevated PSA 07/15/2016  . Incomplete emptying of bladder 07/15/2016  . Urge incontinence 07/15/2016  . Hypothyroidism 04/25/2016  . Macular degeneration 04/25/2016  . Erectile dysfunction due to arterial insufficiency   . Ischemic heart disease with chronotropic incompetence   . CN (constipation) 02/10/2015  . DD (diverticular disease) 02/10/2015  . Fatigue 02/10/2015  . Lichen planus 17/51/0258  . Restless leg 02/10/2015  . Circadian rhythm disorder  02/10/2015  . B12 deficiency 02/10/2015  . COPD (chronic obstructive pulmonary disease) ([place]) 11/14/2014  . Post Inflammatory Lung Changes 11/14/2014  . PAF (paroxysmal atrial fibrillation) ([facility]) CHA2DS2-VASc = 4. AC = Eliquis   . Insomnia 12/09/2013  . OSA on CPAP   . Dyspnea on exertion - -essentially resolved with BB dose reduction & wgt loss 03/29/2013  . Overweight (BMI 25.0-29.9) -- Wgt back up 01/17/2013  . Atherosclerotic heart disease of native coronary artery without angina pectoris - s/p CABG, occluded SVG-D1; patent LIMA-LAD, SVG-OM, SVG- RCA   . Essential hypertension   . Hyperlipidemia with target LDL less than 70   . Aorto-iliac disease ([place])   . BCC (basal cell carcinoma), eyelid 01/03/2013  . Allergic rhinitis  08/17/2009  . Adaptation reaction 05/28/2009  . Acid reflux 05/28/2009  . Arthritis, degenerative 05/28/2009  . 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 Carotid artery occlusion without infarction 06/01/2006  . PAD (peripheral artery disease) - bilateral common iliac stents 12/15/2005  . Aortoiliac occlusive disease ([place]) 11/30/2005  . Atherosclerotic heart disease of artery bypass graft 09/01/1997    Lieutenant Diego PT, DPT 11:02 AM,09/21/19   [place] MAIN [facility] SERVICES [address] [place], [place], [address] Phone: [phone]   Fax:  [phone]  Name: [name] MRN: [id number] Date of Birth: May 07, 1934

## 2019-09-26 ENCOUNTER — Other Ambulatory Visit: Payer: Self-pay

## 2019-09-26 ENCOUNTER — Ambulatory Visit: Payer: Medicare Other

## 2019-09-26 DIAGNOSIS — R2689 Other abnormalities of gait and mobility: Secondary | ICD-10-CM

## 2019-09-26 DIAGNOSIS — M6281 Muscle weakness (generalized): Secondary | ICD-10-CM

## 2019-09-26 DIAGNOSIS — R2681 Unsteadiness on feet: Secondary | ICD-10-CM | POA: Diagnosis not present

## 2019-09-26 DIAGNOSIS — R262 Difficulty in walking, not elsewhere classified: Secondary | ICD-10-CM

## 2019-09-26 NOTE — Therapy (Signed)
[place] MAIN [facility] SERVICES [address] [place], [place], [address] Phone: [phone]   Fax:  [phone]  Physical Therapy Treatment  Patient Details  Name: [name] MRN: [id number] Date of Birth: Jan 16, 1934 Referring Provider (PT): Eulogio Ditch   Encounter Date: 09/26/2019  PT End of Session - 09/26/19 1257    Visit Number  27    Number of Visits  30    Date for PT Re-Evaluation  10/04/19    Authorization Type  7/10 PN    PT Start Time  1015    PT Stop Time  1059    PT Time Calculation (min)  44 min    Equipment Utilized During Treatment  Gait belt    Activity Tolerance  Patient tolerated treatment well    Behavior During Therapy  [facility] for tasks assessed/performed       Past Medical History:  Diagnosis Date  . AAA (abdominal aortic aneurysm) ([place])   . Arthritis   . Atherosclerotic heart disease of native coronary artery without angina pectoris 1998   - s/p CABG, occluded SVG-D1; patent LIMA-LAD, SVG-OM, SVG- RCA  . Basal cell carcinoma of eyelid 01/03/2013   2019 - R side of Nose  . Benign neoplasm of ascending colon   . Benign neoplasm of descending colon   . Cancer [facility]    Skin cancer  . Colon polyps   . COPD (chronic obstructive pulmonary disease) ([place])   . Coronary artery disease   . Dementia ([place])   . Dysrhythmia    atrial fib  . Falls frequently   . GERD (gastroesophageal reflux disease)   . Hemorrhoid 02/10/2015  . History of hiatal hernia   . History of kidney stones   . HOH (hard of hearing)   . Hypertension   . Memory loss   . Osteoporosis   . PAF (paroxysmal atrial fibrillation) ([place])   . Prostate cancer ([place])   . Prostate disorder   . Sciatic pain    Chronic  . Sleep apnea    cpap  . Temporary cerebral vascular dysfunction 02/10/2015   Had negative work-up.  July, 2012.  No medication changes, had forgot them that day, got dehydrated.   . Urinary incontinence     Past Surgical History:   Procedure Laterality Date  . ABDOMINAL AORTIC ENDOVASCULAR STENT GRAFT Bilateral 05/18/2019   Procedure: ABDOMINAL AORTIC ENDOVASCULAR STENT GRAFT;  Surgeon: Algernon Huxley, MD;  Location: [facility] ORS;  Service: Vascular;  Laterality: Bilateral;  . ANKLE SURGERY    . Arch aortogram and carotid aortogram  06/02/2006   Dr Oneida Alar did surgery  . BASAL CELL CARCINOMA EXCISION  04/2016   Dermatology  . CARDIAC CATHETERIZATION  01/30/1998    (Dr. Linard Millers): Native LAD & mRCA CTO. 90% D1. Mod Cx. SVG-D1 CTO. SVG-RCA & SVG-OM along with LIMA-LAD patent;  LV NORMAL Fxn  . CAROTID ENDARTERECTOMY Left Oct. 29, 2007   Dr. Oneida Alar:  . CATARACT EXTRACTION W/PHACO Right 03/05/2017   Procedure: CATARACT EXTRACTION PHACO AND INTRAOCULAR LENS PLACEMENT (IOC);  Surgeon: Leandrew Koyanagi, MD;  Location: [facility] ORS;  Service: Ophthalmology;  Laterality: Right;  [place] 00:58.5 AP% 10.6 CDE 6.20 Fluid Pack lot # [id number]  . CATARACT EXTRACTION W/PHACO Left 04/15/2017   Procedure: CATARACT EXTRACTION PHACO AND INTRAOCULAR LENS PLACEMENT ([place])  Left;  Surgeon: Leandrew Koyanagi, MD;  Location: [place];  Service: Ophthalmology;  Laterality: Left;  . COLONOSCOPY WITH PROPOFOL N/A 09/15/2017  Procedure: COLONOSCOPY WITH PROPOFOL;  Surgeon: Lucilla Lame, MD;  Location: [facility] ENDOSCOPY;  Service: Endoscopy;  Laterality: N/A;  . COLONOSCOPY WITH PROPOFOL N/A 10/26/2018   Procedure: COLONOSCOPY WITH PROPOFOL;  Surgeon: Lucilla Lame, MD;  Location: [facility] ENDOSCOPY;  Service: Endoscopy;  Laterality: N/A;  . CORONARY ARTERY BYPASS GRAFT  1998   LIMA-LAD, SVG-OM, SVG-RPDA, SVG-DIAG  . CPET / MET  12/2012   Mild chronotropic incompetence - read 82% of predicted; also reduced effort; peak VO2 15.7 / 75% (did not reach Max effort) -- suggested ischemic response in last 1.5 minutes of exercise. Normal pulmonary function on PFTs but poor response to  . DOPPLER ECHOCARDIOGRAPHY  11/20/2010   EF =>55%; LV norm mild aortic  scelorosis  . ENDOVASCULAR REPAIR/STENT GRAFT N/A 05/18/2019   Procedure: ENDOVASCULAR REPAIR/STENT GRAFT;  Surgeon: Algernon Huxley, MD;  Location: [place] CV LAB;  Service: Cardiovascular;  Laterality: N/A;  . ESOPHAGOGASTRODUODENOSCOPY (EGD) WITH PROPOFOL N/A 09/15/2017   Procedure: ESOPHAGOGASTRODUODENOSCOPY (EGD) WITH PROPOFOL;  Surgeon: Lucilla Lame, MD;  Location: [facility] ENDOSCOPY;  Service: Endoscopy;  Laterality: N/A;  . FRACTURE SURGERY Right 03/19/2015   wrist  . HIATAL HERNIA REPAIR    . ILIAC ARTERY STENT  12/15/2005   PTA and direct stenting rgt and lft common iliac arteries  . KYPHOPLASTY N/A 04/05/2019   Procedure: KYPHOPLASTY T12, L3, L4 L5;  Surgeon: Hessie Knows, MD;  Location: [facility] ORS;  Service: Orthopedics;  Laterality: N/A;  . NM GATED MYOVIEW ([place] HX)  12/2017   Low Risk - no ischemia or infarct.  EF > 65%. no RWMA  . NM MYOVIEW ([place] HX)  11/11/2010; February 2016   a) TM STRESS----NORMAL PERFUSION ,EF 68%; b) EF 50-55%. Normal LV function. No significant ischemia or infarction.  . OPEN REDUCTION INTERNAL FIXATION (ORIF) DISTAL RADIAL FRACTURE Left 01/13/2019   Procedure: OPEN REDUCTION INTERNAL FIXATION (ORIF) DISTAL  RADIAL FRACTURE - LEFT - SLEEP APNEA;  Surgeon: Hessie Knows, MD;  Location: [facility] ORS;  Service: Orthopedics;  Laterality: Left;  . ORIF ELBOW FRACTURE Right 01/01/2017   Procedure: OPEN REDUCTION INTERNAL FIXATION (ORIF) ELBOW/OLECRANON FRACTURE;  Surgeon: Hessie Knows, MD;  Location: [facility] ORS;  Service: Orthopedics;  Laterality: Right;  . ORIF WRIST FRACTURE Right 03/19/2015   Procedure: OPEN REDUCTION INTERNAL FIXATION (ORIF) WRIST FRACTURE;  Surgeon: Hessie Knows, MD;  Location: [facility] ORS;  Service: Orthopedics;  Laterality: Right;  . THORACIC AORTA - CAROTID ANGIOGRAM  October 2007   Dr. 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 TRANSTHORACIC ECHOCARDIOGRAM  February  2016   [facility]: Normal LV function. Dilated left atrium.    There were no vitals filed for this visit.  Subjective Assessment - 09/26/19 1019    Subjective  Patient reports no falls since last session but feels more unstable. Is willing to do standing work today despite foot pain.    Pertinent History  Patient referred to physical therapy for balance/strength issues s/p abdominal aortic aneurysm (AAA) without rupture repair on 05/18/19 .  Patient has been seen by this therapist in the past (last seen in march of 2020) Patient had a fall on 01/08/19, MRI [facility] dated 03/28/19 report acute compression fractures at T12, L3 and L4 as well as facet arthropathy at the lower lumbar levels and at L3-4 moderate central stenosis. Patient underwent injection on 10/6 at L4-5 and L5-S1. PMH includes AAA, arthritis, atherosclerotic heart disease, (s/p CABG), benign neoplasm of  ascending/descending colon, COPD, CAD, dementia, dysrhythmia, frequent falls, GERD, hemorrhoid, HOH, HTN, PAF, prostate cancer. Monday through Friday have 3 people come in different times from 730 to 12pm. Uses a walker occasionally now    Limitations  Walking;Standing;House hold activities;Other (comment);Lifting    How long can you sit comfortably?  20 minutes before back is painful    How long can you stand comfortably?  hour    How long can you walk comfortably?  using a walker about 30 minutes    Patient Stated Goals  patient would like to improve his balance and strenght.    Currently in Pain?  Yes    Pain Score  5     Pain Location  Back    Pain Orientation  Lower    Pain Descriptors / Indicators  Aching    Pain Type  Chronic pain    Pain Onset  1 to 4 weeks ago    Pain Frequency  Constant    Pain Score  5    Pain Location  Foot    Pain Orientation  Left    Pain Descriptors / Indicators  Aching    Pain Type  Acute pain    Pain Onset  1 to 4 weeks ago    Pain Frequency  Intermittent          Nustep Lvl 3 seat 12 RPM> 60 ;  3 minutes for cardiovascular challenge.    In // bars: CGA for stability and safety awareness for decreased fall risk. Cueing for task orientation and body mechanics.  - modified tandem stance, one foot on an airex pad of each color, 30 second holds x 2 trials each LE, more challenging with LLE requiring Min A for maintaining upright posture due to forward trunk flexion -airex pad 5lb bar straight arm raises 10x, chest press 10x, ; frequent posterior trunk lean requiring additional support for maintaining COM -airex pad 6" step toe taps single UE support 12x each LE ; requires PT to place foot between patient's leg for reduction of narrow BOS to increase stability  -airex pad: static stand with eyes closed, 2x 30 seconds, cueing for core contraction for maintaining COM  Sit to stand with airex pad under feet 10x. Requires SUE support, stabilization of chair and CGA of patient, cueing for full upright posture.   RTB side stepping length of // bars 4 x length ; max cueing for foot clearance  Seated: cues for body mechanics and neutral alignment  -RTB df 15x each LE  -RTB around toes; green ball between knees: single leg step out with foot in neutral alignment for gluteal and lateral ankle musculature stabilizers 10x each LE.  -RTB IR to PT hand 12x each LE  -LAQ with green ball between feet for adduction activation 10x    Pt educated throughout session about proper posture and technique with exercises. Improved exercise technique, movement at target joints, use of target muscles after min to mod verbal, visual, tactile cues.                        PT Education - 09/26/19 1257    Education provided  Yes    Education Details  exercise technique, body mechanics    Person(s) Educated  Patient    Methods  Explanation;Demonstration;Tactile cues;Verbal cues    Comprehension  Verbalized understanding;Returned demonstration;Verbal cues required;Tactile cues required       PT  Short Term Goals - 08/09/19 1745  PT SHORT TERM GOAL #1   Title  Patient will be independent in home exercise program to improve strength/mobility for better functional independence with ADLs.    Baseline  10/16: HEP given 11/17: HEP compliant    Time  4    Period  Weeks    Status  Achieved    Target Date  07/15/19      PT SHORT TERM GOAL #2   Title  Patient will deny any falls over past 4 weeks to demonstrate improved safety awareness at home and work.    Baseline  10/16: one major fall 11/17: no falls    Time  4    Period  Weeks    Status  Achieved    Target Date  07/15/19        PT Long Term Goals - 08/30/19 1011      PT LONG TERM GOAL #1   Title  Patient will increase BLE gross strength to 4+/5 as to improve functional strength for independent gait, increased standing tolerance and increased ADL ability.    Baseline  10/16: R gross 4-/5 with add/ext 3+/5 L grossly 3+/5 with hip add/ext 2+/5 11/17: R 4/5 add/ext; -/5 L 4-/5 hip add/ext 3/5 12/8: R: 4/5 L 4-/5 hip add/ext 3+/5    Time  8    Period  Weeks    Status  Partially Met    Target Date  10/04/19      PT LONG TERM GOAL #2   Title  Patient (> 49 years old) will complete five times sit to stand test in < 15 seconds indicating an increased LE strength and improved balance.    Baseline  10/16: 17 seconds with heavy use of hands on knees 11/17: 16 seconds one near LOB 12/8: 13 seconds one posterior LOB    Time  8    Period  Weeks    Status  Achieved      PT LONG TERM GOAL #3   Title  Patient will increase Berg Balance score by > 6 points (43/56)  to demonstrate decreased fall risk during functional activities.    Baseline  10/16: 37/56 11/17: 42/56 12/8: 47/56    Time  8    Period  Weeks    Status  Achieved      PT LONG TERM GOAL #4   Title  Patient will increase 10 meter walk test to >1.71ms as to improve gait speed for better community ambulation and to reduce fall risk.    Baseline  10/16: 0.91 w/o AD 11/17:  1.43 m/s with no AD    Time  8    Period  Weeks    Status  Achieved      PT LONG TERM GOAL #5   Title  Patient will increase ABC scale score >80% to demonstrate better functional mobility and better confidence with ADLs.    Baseline  10/16; 71% 11/17: 80% 12/8: 83%    Time  8    Period  Weeks    Status  Achieved      PT LONG TERM GOAL #6   Title  Patient will improve score on DGI to > or = 19/24 on the DGI to increase dynamic balance during mobility and to decrease risk of falls.    Baseline  12/8: perform next session 12/29: 18/24    Time  8    Period  Weeks    Status  Partially Met    Target Date  10/04/19  PT LONG TERM GOAL #7   Title  Patient will increase Berg Balance score by > 6 points (53/56)  to demonstrate decreased fall risk during functional activities.    Baseline  12/8; 47/56 12/29: 49/56    Time  8    Period  Weeks    Status  Partially Met    Target Date  10/04/19            Plan - 09/26/19 1308    Clinical Impression Statement  Patient presents to physical therapy with excellent motivation. Progression of stability interventions performed with patient demonstrating limited ankle righting reactions resulting in trunk instability on unstable surfaces. He is challenged with ankle strength and stability fatiguing quickly against resistance. Multimodal cueing required throughout session for optimal body mechanics. The patient would benefit from continued skilled PT intervention to maximize mobility, safety, and functional abilities    Personal Factors and Comorbidities  Age;Comorbidity 3+;Fitness;Past/Current Experience;Sex;Social Background;Time since onset of injury/illness/exacerbation;Transportation    Comorbidities  AAA, arthritis, atherosclerotic heart disease, (s/p CABG), benign neoplasm of ascending/descending colon, COPD, CAD, dementia, dysrhythmia, frequent falls, GERD, hemorrhoid, HOH, HTN, PAF, prostate cancer.    Examination-Activity Limitations   Bathing;Bend;Carry;Dressing;Continence;Stairs;Squat;Reach Overhead;Locomotion Level;Lift;Stand;Transfers;Toileting    Examination-Participation Restrictions  Church;Cleaning;Community Activity;Driving;Interpersonal Relationship;Laundry;Volunteer;Shop;Personal Finances;Meal Prep;Yard Work    [employer] Potential  Fair    Clinical Impairments Affecting Rehab Potential  (+) good support system, high prior level of function (-) currently undergoing radiation therapy for prostate cancer, memory deficits     PT Frequency  2x / week    PT Duration  8 weeks    PT Treatment/Interventions  ADLs/Self Care Home Management;Aquatic Therapy;Cryotherapy;Moist Heat;Traction;Ultrasound;Therapeutic activities;Functional mobility training;Stair training;Gait training;DME Instruction;Therapeutic exercise;Balance training;Neuromuscular re-education;Patient/family education;Manual techniques;Passive range of motion;Energy conservation;Vestibular;Taping    PT Next Visit Plan  progress dynamic stability    PT Home Exercise Plan  see above    Consulted and Agree with Plan of Care  Patient       Patient will benefit from skilled therapeutic intervention in order to improve the following deficits and impairments:  Abnormal gait, Cardiopulmonary status limiting activity, Decreased activity tolerance, Decreased balance, Decreased knowledge of precautions, Decreased endurance, Decreased coordination, Decreased cognition, Decreased knowledge of use of DME, Decreased mobility, Difficulty walking, Decreased safety awareness, Decreased strength, Impaired flexibility, Impaired perceived functional ability, Impaired sensation, Postural dysfunction, Improper body mechanics, Pain  Visit Diagnosis: Unsteadiness on feet  Muscle weakness (generalized)  Other abnormalities of gait and mobility  Difficulty in walking, not elsewhere classified     Problem List Patient Active Problem List   Diagnosis Date Noted  . Status post abdominal  aortic aneurysm (AAA) repair 04/15/2019  . Candidiasis 02/22/2019  . Malignant neoplasm of descending colon ([place])   . Benign neoplasm of descending colon   . Benign neoplasm of transverse colon   . Do not intubate, cardiopulmonary resuscitation (CPR)-only code status 10/08/2018  . Prostate cancer ([place]) 02/25/2018  . Prostatic cyst 01/15/2018  . 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 ([place]) 09/15/2017  . Blood in stool   . Abnormal feces   . Stricture and stenosis of esophagus   . Mild cognitive impairment 08/18/2017  . Senile purpura ([place]) 04/29/2017  . Anemia 04/29/2017  . Frequent falls 04/29/2017  . Acute respiratory failure ([place]) 12/17/2016  . Simple chronic bronchitis ([place]) 12/17/2016  . Benign localized hyperplasia of prostate with urinary obstruction 07/15/2016  . Elevated PSA 07/15/2016  . Incomplete emptying of bladder 07/15/2016  . Urge incontinence 07/15/2016  . Hypothyroidism 04/25/2016  .  Macular degeneration 04/25/2016  . Erectile dysfunction due to arterial insufficiency   . Ischemic heart disease with chronotropic incompetence   . CN (constipation) 02/10/2015  . DD (diverticular disease) 02/10/2015  . Fatigue 02/10/2015  . Lichen planus 52/47/9980  . Restless leg 02/10/2015  . 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 B12 deficiency 02/10/2015  . COPD (chronic obstructive pulmonary disease) ([place]) 11/14/2014  . Post Inflammatory Lung Changes 11/14/2014  . PAF (paroxysmal atrial fibrillation) ([facility]) CHA2DS2-VASc = 4. AC = Eliquis   . Insomnia 12/09/2013  . OSA on CPAP   . Dyspnea on exertion - -essentially resolved with BB dose reduction & wgt loss 03/29/2013  . Overweight (BMI 25.0-29.9) -- Wgt back up 01/17/2013  . Atherosclerotic heart disease of native coronary artery without angina pectoris - s/p CABG, occluded SVG-D1; patent LIMA-LAD, SVG-OM, SVG- RCA   . Essential hypertension   . Hyperlipidemia with target LDL less than 70   . Aorto-iliac disease ([place])   . BCC  (basal cell carcinoma), eyelid 01/03/2013  . Allergic rhinitis 08/17/2009  . Adaptation reaction 05/28/2009  . Acid reflux 05/28/2009  . Arthritis, degenerative 05/28/2009  . 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 Carotid artery occlusion without infarction 06/01/2006  . PAD (peripheral artery disease) - bilateral common iliac stents 12/15/2005  . Aortoiliac occlusive disease ([place]) 11/30/2005  . Atherosclerotic heart disease of artery bypass graft 09/01/1997   Janna Arch, PT, DPT    09/26/2019, 1:10 PM  [place] MAIN [facility] SERVICES [address] [place], [place], [address] Phone: [phone]   Fax:  [phone]  Name: [name] MRN: [id number] Date of Birth: 1934-06-10

## 2019-09-28 ENCOUNTER — Ambulatory Visit: Payer: Medicare Other

## 2019-09-28 ENCOUNTER — Other Ambulatory Visit: Payer: Self-pay

## 2019-09-28 DIAGNOSIS — R2681 Unsteadiness on feet: Secondary | ICD-10-CM | POA: Diagnosis not present

## 2019-09-28 DIAGNOSIS — M6281 Muscle weakness (generalized): Secondary | ICD-10-CM

## 2019-09-28 DIAGNOSIS — R2689 Other abnormalities of gait and mobility: Secondary | ICD-10-CM

## 2019-09-28 DIAGNOSIS — R262 Difficulty in walking, not elsewhere classified: Secondary | ICD-10-CM

## 2019-09-28 NOTE — Therapy (Signed)
[place] MAIN [facility] SERVICES [address] [place], [place], [address] Phone: [phone]   Fax:  [phone]  Physical Therapy Treatment  Patient Details  Name: [name] MRN: [id number] Date of Birth: 06/05/1934 Referring Provider (PT): Eulogio Ditch   Encounter Date: 09/28/2019  PT End of Session - 09/28/19 1016    Visit Number  28    Number of Visits  30    Date for PT Re-Evaluation  10/04/19    Authorization Type  8/10 PN    PT Start Time  1011    PT Stop Time  1055    PT Time Calculation (min)  44 min    Equipment Utilized During Treatment  Gait belt    Activity Tolerance  Patient tolerated treatment well    Behavior During Therapy  [facility] for tasks assessed/performed       Past Medical History:  Diagnosis Date  . AAA (abdominal aortic aneurysm) ([place])   . Arthritis   . Atherosclerotic heart disease of native coronary artery without angina pectoris 1998   - s/p CABG, occluded SVG-D1; patent LIMA-LAD, SVG-OM, SVG- RCA  . Basal cell carcinoma of eyelid 01/03/2013   2019 - R side of Nose  . Benign neoplasm of ascending colon   . Benign neoplasm of descending colon   . Cancer [facility])    Skin cancer  . Colon polyps   . COPD (chronic obstructive pulmonary disease) ([place])   . Coronary artery disease   . Dementia ([place])   . Dysrhythmia    atrial fib  . Falls frequently   . GERD (gastroesophageal reflux disease)   . Hemorrhoid 02/10/2015  . History of hiatal hernia   . History of kidney stones   . HOH (hard of hearing)   . Hypertension   . Memory loss   . Osteoporosis   . PAF (paroxysmal atrial fibrillation) ([place])   . Prostate cancer ([place])   . Prostate disorder   . Sciatic pain    Chronic  . Sleep apnea    cpap  . Temporary cerebral vascular dysfunction 02/10/2015   Had negative work-up.  July, 2012.  No medication changes, had forgot them that day, got dehydrated.   . Urinary incontinence     Past Surgical History:   Procedure Laterality Date  . ABDOMINAL AORTIC ENDOVASCULAR STENT GRAFT Bilateral 05/18/2019   Procedure: ABDOMINAL AORTIC ENDOVASCULAR STENT GRAFT;  Surgeon: Algernon Huxley, MD;  Location: [facility] ORS;  Service: Vascular;  Laterality: Bilateral;  . ANKLE SURGERY    . Arch aortogram and carotid aortogram  06/02/2006   Dr Oneida Alar did surgery  . BASAL CELL CARCINOMA EXCISION  04/2016   Dermatology  . CARDIAC CATHETERIZATION  01/30/1998    (Dr. Linard Millers): Native LAD & mRCA CTO. 90% D1. Mod Cx. SVG-D1 CTO. SVG-RCA & SVG-OM along with LIMA-LAD patent;  LV NORMAL Fxn  . CAROTID ENDARTERECTOMY Left Oct. 29, 2007   Dr. Oneida Alar:  . CATARACT EXTRACTION W/PHACO Right 03/05/2017   Procedure: CATARACT EXTRACTION PHACO AND INTRAOCULAR LENS PLACEMENT (IOC);  Surgeon: Leandrew Koyanagi, MD;  Location: [facility] ORS;  Service: Ophthalmology;  Laterality: Right;  [place] 00:58.5 AP% 10.6 CDE 6.20 Fluid Pack lot # [id number]  . CATARACT EXTRACTION W/PHACO Left 04/15/2017   Procedure: CATARACT EXTRACTION PHACO AND INTRAOCULAR LENS PLACEMENT ([place])  Left;  Surgeon: Leandrew Koyanagi, MD;  Location: [place];  Service: Ophthalmology;  Laterality: Left;  . COLONOSCOPY WITH PROPOFOL N/A 09/15/2017  Procedure: COLONOSCOPY WITH PROPOFOL;  Surgeon: Lucilla Lame, MD;  Location: [facility] ENDOSCOPY;  Service: Endoscopy;  Laterality: N/A;  . COLONOSCOPY WITH PROPOFOL N/A 10/26/2018   Procedure: COLONOSCOPY WITH PROPOFOL;  Surgeon: Lucilla Lame, MD;  Location: [facility] ENDOSCOPY;  Service: Endoscopy;  Laterality: N/A;  . CORONARY ARTERY BYPASS GRAFT  1998   LIMA-LAD, SVG-OM, SVG-RPDA, SVG-DIAG  . CPET / MET  12/2012   Mild chronotropic incompetence - read 82% of predicted; also reduced effort; peak VO2 15.7 / 75% (did not reach Max effort) -- suggested ischemic response in last 1.5 minutes of exercise. Normal pulmonary function on PFTs but poor response to  . DOPPLER ECHOCARDIOGRAPHY  11/20/2010   EF =>55%; LV norm mild aortic  scelorosis  . ENDOVASCULAR REPAIR/STENT GRAFT N/A 05/18/2019   Procedure: ENDOVASCULAR REPAIR/STENT GRAFT;  Surgeon: Algernon Huxley, MD;  Location: [place] CV LAB;  Service: Cardiovascular;  Laterality: N/A;  . ESOPHAGOGASTRODUODENOSCOPY (EGD) WITH PROPOFOL N/A 09/15/2017   Procedure: ESOPHAGOGASTRODUODENOSCOPY (EGD) WITH PROPOFOL;  Surgeon: Lucilla Lame, MD;  Location: [facility] ENDOSCOPY;  Service: Endoscopy;  Laterality: N/A;  . FRACTURE SURGERY Right 03/19/2015   wrist  . HIATAL HERNIA REPAIR    . ILIAC ARTERY STENT  12/15/2005   PTA and direct stenting rgt and lft common iliac arteries  . KYPHOPLASTY N/A 04/05/2019   Procedure: KYPHOPLASTY T12, L3, L4 L5;  Surgeon: Hessie Knows, MD;  Location: [facility] ORS;  Service: Orthopedics;  Laterality: N/A;  . NM GATED MYOVIEW ([place] [facility])  12/2017   Low Risk - no ischemia or infarct.  EF > 65%. no RWMA  . NM MYOVIEW ([place] HX)  11/11/2010; February 2016   a) TM STRESS----NORMAL PERFUSION ,EF 68%; b) EF 50-55%. Normal LV function. No significant ischemia or infarction.  . OPEN REDUCTION INTERNAL FIXATION (ORIF) DISTAL RADIAL FRACTURE Left 01/13/2019   Procedure: OPEN REDUCTION INTERNAL FIXATION (ORIF) DISTAL  RADIAL FRACTURE - LEFT - SLEEP APNEA;  Surgeon: Hessie Knows, MD;  Location: [facility] ORS;  Service: Orthopedics;  Laterality: Left;  . ORIF ELBOW FRACTURE Right 01/01/2017   Procedure: OPEN REDUCTION INTERNAL FIXATION (ORIF) ELBOW/OLECRANON FRACTURE;  Surgeon: Hessie Knows, MD;  Location: [facility] ORS;  Service: Orthopedics;  Laterality: Right;  . ORIF WRIST FRACTURE Right 03/19/2015   Procedure: OPEN REDUCTION INTERNAL FIXATION (ORIF) WRIST FRACTURE;  Surgeon: Hessie Knows, MD;  Location: [facility] ORS;  Service: Orthopedics;  Laterality: Right;  . THORACIC AORTA - CAROTID ANGIOGRAM  October 2007   Dr. 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 TRANSTHORACIC ECHOCARDIOGRAM  February  2016   [facility]: Normal LV function. Dilated left atrium.    There were no vitals filed for this visit.  Subjective Assessment - 09/28/19 1014    Subjective  Patient reports he has another doctor appointment this afternoon for pulmonary. Is having some foot pain but wants to continue working on balance, would request to not walk too much due to pain with weightbearing.    Pertinent History  Patient referred to physical therapy for balance/strength issues s/p abdominal aortic aneurysm (AAA) without rupture repair on 05/18/19 .  Patient has been seen by this therapist in the past (last seen in march of 2020) Patient had a fall on 01/08/19, MRI [facility] dated 03/28/19 report acute compression fractures at T12, L3 and L4 as well as facet arthropathy at the lower lumbar levels and at L3-4 moderate central stenosis. Patient underwent injection on 10/6 at L4-5  and L5-S1. PMH includes AAA, arthritis, atherosclerotic heart disease, (s/p CABG), benign neoplasm of ascending/descending colon, COPD, CAD, dementia, dysrhythmia, frequent falls, GERD, hemorrhoid, HOH, HTN, PAF, prostate cancer. Monday through Friday have 3 people come in different times from 730 to 12pm. Uses a walker occasionally now    Limitations  Walking;Standing;House hold activities;Other (comment);Lifting    How long can you sit comfortably?  20 minutes before back is painful    How long can you stand comfortably?  hour    How long can you walk comfortably?  using a walker about 30 minutes    Patient Stated Goals  patient would like to improve his balance and strenght.    Currently in Pain?  Yes    Pain Score  3     Pain Location  Foot    Pain Orientation  Left    Pain Descriptors / Indicators  Aching    Pain Type  Chronic pain    Pain Onset  More than a month ago    Pain Frequency  Constant          Nustep Lvl 3 seat 12 RPM> 60 ; 4 minutes for cardiovascular challenge.   In // bars: CGA for stability and safety awareness for decreased  fall risk. Cueing for task orientation and body mechanics.   -marches pushing into ball pressing it into PT hand for decreased stability with single limb stability , cueing for decreased velocity  -Half foam roller; heel toe rocker with UE support cueing for upright posture. 10x  -standing balloon taps reaching inside/outside BOS 3 minutes for reaction timing/sequencing  -airex pad: step over half foam roller ; max cueing for bringing feet further back onto airex pad. 10x each LE, SUE support, very challenging.   -airex pad: side step over half foam roller onto additional airex pad and back 20 each direction with decrease support from SUE to two finger support.   - modified tandem stance, one foot on an airex pad of each color, 30 second holds x 2 trials each LE, more challenging with LLE requiring Min A for maintaining upright posture due to forward trunk flexion   Step over orange hurdle and back 10x each LE SUE support   Seated: cues for body mechanics and neutral alignment  -dynadisc circles clockwise 10x , counterclockwise 10x each LE  -sit to stand pressing rainbow ball to ceiling 10x  -rainbow ball adduction squeezes, cueing for increased squeezing  -RTB 4 way ankle: df, pf, IR, ER 10x each direction, each LE; cueing for neutral knee alignment for optimal muscle recruitment   -LAQ with green ball between feet for adduction activation 10x       Pt educated throughout session about proper posture and technique with exercises. Improved exercise technique, movement at target joints, use of target muscles after min to mod verbal, visual, tactile cues.                        PT Education - 09/28/19 1016    Education provided  Yes    Education Details  exercise technique body mechanics    Person(s) Educated  Patient    Methods  Explanation;Demonstration;Tactile cues;Verbal cues    Comprehension  Verbalized understanding;Returned demonstration;Verbal cues  required;Tactile cues required       PT Short Term Goals - 08/09/19 1745      PT SHORT TERM GOAL #1   Title  Patient will be independent in home exercise program  to improve strength/mobility for better functional independence with ADLs.    Baseline  10/16: HEP given 11/17: HEP compliant    Time  4    Period  Weeks    Status  Achieved    Target Date  07/15/19      PT SHORT TERM GOAL #2   Title  Patient will deny any falls over past 4 weeks to demonstrate improved safety awareness at home and work.    Baseline  10/16: one major fall 11/17: no falls    Time  4    Period  Weeks    Status  Achieved    Target Date  07/15/19        PT Long Term Goals - 08/30/19 1011      PT LONG TERM GOAL #1   Title  Patient will increase BLE gross strength to 4+/5 as to improve functional strength for independent gait, increased standing tolerance and increased ADL ability.    Baseline  10/16: R gross 4-/5 with add/ext 3+/5 L grossly 3+/5 with hip add/ext 2+/5 11/17: R 4/5 add/ext; -/5 L 4-/5 hip add/ext 3/5 12/8: R: 4/5 L 4-/5 hip add/ext 3+/5    Time  8    Period  Weeks    Status  Partially Met    Target Date  10/04/19      PT LONG TERM GOAL #2   Title  Patient (> 30 years old) will complete five times sit to stand test in < 15 seconds indicating an increased LE strength and improved balance.    Baseline  10/16: 17 seconds with heavy use of hands on knees 11/17: 16 seconds one near LOB 12/8: 13 seconds one posterior LOB    Time  8    Period  Weeks    Status  Achieved      PT LONG TERM GOAL #3   Title  Patient will increase Berg Balance score by > 6 points (43/56)  to demonstrate decreased fall risk during functional activities.    Baseline  10/16: 37/56 11/17: 42/56 12/8: 47/56    Time  8    Period  Weeks    Status  Achieved      PT LONG TERM GOAL #4   Title  Patient will increase 10 meter walk test to >1.70ms as to improve gait speed for better community ambulation and to reduce fall  risk.    Baseline  10/16: 0.91 w/o AD 11/17: 1.43 m/s with no AD    Time  8    Period  Weeks    Status  Achieved      PT LONG TERM GOAL #5   Title  Patient will increase ABC scale score >80% to demonstrate better functional mobility and better confidence with ADLs.    Baseline  10/16; 71% 11/17: 80% 12/8: 83%    Time  8    Period  Weeks    Status  Achieved      PT LONG TERM GOAL #6   Title  Patient will improve score on DGI to > or = 19/24 on the DGI to increase dynamic balance during mobility and to decrease risk of falls.    Baseline  12/8: perform next session 12/29: 18/24    Time  8    Period  Weeks    Status  Partially Met    Target Date  10/04/19      PT LONG TERM GOAL #7   Title  Patient will increase  Berg Balance score by > 6 points (53/56)  to demonstrate decreased fall risk during functional activities.    Baseline  12/8; 47/56 12/29: 49/56    Time  8    Period  Weeks    Status  Partially Met    Target Date  10/04/19            Plan - 09/28/19 1042    Clinical Impression Statement  Patient demonstrates more fatigue this session requiring more frequent rest breaks and monitoring of oxygen. Patient is more challenged with task orientation/sequencing this session requiring more frequent cueing than previous sessions however continues to be motivated to improve his stability. Continued focus on stability interventions with ankle righting reactions will benefit the patient. The patient would benefit from continued skilled PT intervention to maximize mobility, safety, and functional abilities    Personal Factors and Comorbidities  Age;Comorbidity 3+;Fitness;Past/Current Experience;Sex;Social Background;Time since onset of injury/illness/exacerbation;Transportation    Comorbidities  AAA, arthritis, atherosclerotic heart disease, (s/p CABG), benign neoplasm of ascending/descending colon, COPD, CAD, dementia, dysrhythmia, frequent falls, GERD, hemorrhoid, HOH, HTN, PAF,  prostate cancer.    Examination-Activity Limitations  Bathing;Bend;Carry;Dressing;Continence;Stairs;Squat;Reach Overhead;Locomotion Level;Lift;Stand;Transfers;Toileting    Examination-Participation Restrictions  Church;Cleaning;Community Activity;Driving;Interpersonal Relationship;Laundry;Volunteer;Shop;Personal Finances;Meal Prep;Yard Work    [employer] Potential  Fair    Clinical Impairments Affecting Rehab Potential  (+) good support system, high prior level of function (-) currently undergoing radiation therapy for prostate cancer, memory deficits     PT Frequency  2x / week    PT Duration  8 weeks    PT Treatment/Interventions  ADLs/Self Care Home Management;Aquatic Therapy;Cryotherapy;Moist Heat;Traction;Ultrasound;Therapeutic activities;Functional mobility training;Stair training;Gait training;DME Instruction;Therapeutic exercise;Balance training;Neuromuscular re-education;Patient/family education;Manual techniques;Passive range of motion;Energy conservation;Vestibular;Taping    PT Next Visit Plan  progress dynamic stability    PT Home Exercise Plan  see above    Consulted and Agree with Plan of Care  Patient       Patient will benefit from skilled therapeutic intervention in order to improve the following deficits and impairments:  Abnormal gait, Cardiopulmonary status limiting activity, Decreased activity tolerance, Decreased balance, Decreased knowledge of precautions, Decreased endurance, Decreased coordination, Decreased cognition, Decreased knowledge of use of DME, Decreased mobility, Difficulty walking, Decreased safety awareness, Decreased strength, Impaired flexibility, Impaired perceived functional ability, Impaired sensation, Postural dysfunction, Improper body mechanics, Pain  Visit Diagnosis: Unsteadiness on feet  Muscle weakness (generalized)  Other abnormalities of gait and mobility  Difficulty in walking, not elsewhere classified     Problem List Patient Active Problem  List   Diagnosis Date Noted  . Status post abdominal aortic aneurysm (AAA) repair 04/15/2019  . Candidiasis 02/22/2019  . Malignant neoplasm of descending colon ([place])   . Benign neoplasm of descending colon   . Benign neoplasm of transverse colon   . Do not intubate, cardiopulmonary resuscitation (CPR)-only code status 10/08/2018  . Prostate cancer ([place]) 02/25/2018  . Prostatic cyst 01/15/2018  . 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 ([place]) 09/15/2017  . Blood in stool   . Abnormal feces   . Stricture and stenosis of esophagus   . Mild cognitive impairment 08/18/2017  . Senile purpura ([place]) 04/29/2017  . Anemia 04/29/2017  . Frequent falls 04/29/2017  . Acute respiratory failure ([place]) 12/17/2016  . Simple chronic bronchitis ([place]) 12/17/2016  . Benign localized hyperplasia of prostate with urinary obstruction 07/15/2016  . Elevated PSA 07/15/2016  . Incomplete emptying of bladder 07/15/2016  . Urge incontinence 07/15/2016  . Hypothyroidism 04/25/2016  . Macular degeneration 04/25/2016  . Erectile  dysfunction due to arterial insufficiency   . Ischemic heart disease with chronotropic incompetence   . CN (constipation) 02/10/2015  . DD (diverticular disease) 02/10/2015  . Fatigue 02/10/2015  . Lichen planus 32/20/2542  . Restless leg 02/10/2015  . 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 B12 deficiency 02/10/2015  . COPD (chronic obstructive pulmonary disease) ([place]) 11/14/2014  . Post Inflammatory Lung Changes 11/14/2014  . PAF (paroxysmal atrial fibrillation) ([facility]) CHA2DS2-VASc = 4. AC = Eliquis   . Insomnia 12/09/2013  . OSA on CPAP   . Dyspnea on exertion - -essentially resolved with BB dose reduction & wgt loss 03/29/2013  . Overweight (BMI 25.0-29.9) -- Wgt back up 01/17/2013  . Atherosclerotic heart disease of native coronary artery without angina pectoris - s/p CABG, occluded SVG-D1; patent LIMA-LAD, SVG-OM, SVG- RCA   . Essential hypertension   . Hyperlipidemia with target LDL  less than 70   . Aorto-iliac disease ([place])   . BCC (basal cell carcinoma), eyelid 01/03/2013  . Allergic rhinitis 08/17/2009  . Adaptation reaction 05/28/2009  . Acid reflux 05/28/2009  . Arthritis, degenerative 05/28/2009  . 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 Carotid artery occlusion without infarction 06/01/2006  . PAD (peripheral artery disease) - bilateral common iliac stents 12/15/2005  . Aortoiliac occlusive disease ([place]) 11/30/2005  . Atherosclerotic heart disease of artery bypass graft 09/01/1997   Janna Arch, PT, DPT   09/28/2019, 10:55 AM  [place] MAIN [facility] SERVICES [address] [place], [place], [address] Phone: [phone]   Fax:  [phone]  Name: [name] MRN: [id number] Date of Birth: Jan 16, 1934

## 2019-09-30 ENCOUNTER — Other Ambulatory Visit: Payer: Self-pay | Admitting: Family Medicine

## 2019-09-30 DIAGNOSIS — E785 Hyperlipidemia, unspecified: Secondary | ICD-10-CM

## 2019-10-01 ENCOUNTER — Other Ambulatory Visit: Payer: Self-pay | Admitting: Family Medicine

## 2019-10-01 DIAGNOSIS — E785 Hyperlipidemia, unspecified: Secondary | ICD-10-CM

## 2019-10-03 ENCOUNTER — Ambulatory Visit: Payer: Medicare Other | Attending: Nurse Practitioner

## 2019-10-03 ENCOUNTER — Other Ambulatory Visit: Payer: Self-pay

## 2019-10-03 DIAGNOSIS — R262 Difficulty in walking, not elsewhere classified: Secondary | ICD-10-CM | POA: Diagnosis present

## 2019-10-03 DIAGNOSIS — R2681 Unsteadiness on feet: Secondary | ICD-10-CM | POA: Insufficient documentation

## 2019-10-03 DIAGNOSIS — M6281 Muscle weakness (generalized): Secondary | ICD-10-CM

## 2019-10-03 DIAGNOSIS — R2689 Other abnormalities of gait and mobility: Secondary | ICD-10-CM

## 2019-10-03 NOTE — Therapy (Signed)
[place] MAIN [facility] SERVICES [address] [place], [place], [address] Phone: [phone]   Fax:  [phone]  Physical Therapy Treatment/RECERT  Patient Details  Name: [name] MRN: [id number] Date of Birth: [birth year] Referring Provider (PT): Eulogio Ditch   Encounter Date: 10/03/2019  PT End of Session - 10/03/19 1308    Visit Number  29    Number of Visits  45    Date for PT Re-Evaluation  11/28/19    Authorization Type  9/10 PN    PT Start Time  1259    PT Stop Time  1344    PT Time Calculation (min)  45 min    Equipment Utilized During Treatment  Gait belt    Activity Tolerance  Patient tolerated treatment well    Behavior During Therapy  [facility] for tasks assessed/performed       Past Medical History:  Diagnosis Date  . AAA (abdominal aortic aneurysm) ([place])   . Arthritis   . Atherosclerotic heart disease of native coronary artery without angina pectoris 1998   - s/p CABG, occluded SVG-D1; patent LIMA-LAD, SVG-OM, SVG- RCA  . Basal cell carcinoma of eyelid 01/03/2013   2019 - R side of Nose  . Benign neoplasm of ascending colon   . Benign neoplasm of descending colon   . Cancer [facility])    Skin cancer  . Colon polyps   . COPD (chronic obstructive pulmonary disease) ([place])   . Coronary artery disease   . Dementia ([place])   . Dysrhythmia    atrial fib  . Falls frequently   . GERD (gastroesophageal reflux disease)   . Hemorrhoid 02/10/2015  . History of hiatal hernia   . History of kidney stones   . HOH (hard of hearing)   . Hypertension   . Memory loss   . Osteoporosis   . PAF (paroxysmal atrial fibrillation) ([place])   . Prostate cancer ([place])   . Prostate disorder   . Sciatic pain    Chronic  . Sleep apnea    cpap  . Temporary cerebral vascular dysfunction 02/10/2015   Had negative work-up.  July, 2012.  No medication changes, had forgot them that day, got dehydrated.   . Urinary incontinence     Past Surgical  History:  Procedure Laterality Date  . ABDOMINAL AORTIC ENDOVASCULAR STENT GRAFT Bilateral 05/18/2019   Procedure: ABDOMINAL AORTIC ENDOVASCULAR STENT GRAFT;  Surgeon: Algernon Huxley, MD;  Location: [facility] ORS;  Service: Vascular;  Laterality: Bilateral;  . ANKLE SURGERY    . Arch aortogram and carotid aortogram  06/02/2006   Dr Oneida Alar did surgery  . BASAL CELL CARCINOMA EXCISION  04/2016   Dermatology  . CARDIAC CATHETERIZATION  01/30/1998    (Dr. Linard Millers): Native LAD & mRCA CTO. 90% D1. Mod Cx. SVG-D1 CTO. SVG-RCA & SVG-OM along with LIMA-LAD patent;  LV NORMAL Fxn  . CAROTID ENDARTERECTOMY Left Oct. 29, 2007   Dr. Oneida Alar:  . CATARACT EXTRACTION W/PHACO Right 03/05/2017   Procedure: CATARACT EXTRACTION PHACO AND INTRAOCULAR LENS PLACEMENT (IOC);  Surgeon: Leandrew Koyanagi, MD;  Location: [facility] ORS;  Service: Ophthalmology;  Laterality: Right;  [place] 00:58.5 AP% 10.6 CDE 6.20 Fluid Pack lot # [id number]  . CATARACT EXTRACTION W/PHACO Left 04/15/2017   Procedure: CATARACT EXTRACTION PHACO AND INTRAOCULAR LENS PLACEMENT ([place])  Left;  Surgeon: Leandrew Koyanagi, MD;  Location: [place];  Service: Ophthalmology;  Laterality: Left;  . COLONOSCOPY WITH PROPOFOL N/A 09/15/2017  Procedure: COLONOSCOPY WITH PROPOFOL;  Surgeon: Lucilla Lame, MD;  Location: [facility] ENDOSCOPY;  Service: Endoscopy;  Laterality: N/A;  . COLONOSCOPY WITH PROPOFOL N/A 10/26/2018   Procedure: COLONOSCOPY WITH PROPOFOL;  Surgeon: Lucilla Lame, MD;  Location: [facility] ENDOSCOPY;  Service: Endoscopy;  Laterality: N/A;  . CORONARY ARTERY BYPASS GRAFT  1998   LIMA-LAD, SVG-OM, SVG-RPDA, SVG-DIAG  . CPET / MET  12/2012   Mild chronotropic incompetence - read 82% of predicted; also reduced effort; peak VO2 15.7 / 75% (did not reach Max effort) -- suggested ischemic response in last 1.5 minutes of exercise. Normal pulmonary function on PFTs but poor response to  . DOPPLER ECHOCARDIOGRAPHY  11/20/2010   EF =>55%; LV norm mild aortic  scelorosis  . ENDOVASCULAR REPAIR/STENT GRAFT N/A 05/18/2019   Procedure: ENDOVASCULAR REPAIR/STENT GRAFT;  Surgeon: Algernon Huxley, MD;  Location: [place] CV LAB;  Service: Cardiovascular;  Laterality: N/A;  . ESOPHAGOGASTRODUODENOSCOPY (EGD) WITH PROPOFOL N/A 09/15/2017   Procedure: ESOPHAGOGASTRODUODENOSCOPY (EGD) WITH PROPOFOL;  Surgeon: Lucilla Lame, MD;  Location: [facility] ENDOSCOPY;  Service: Endoscopy;  Laterality: N/A;  . FRACTURE SURGERY Right 03/19/2015   wrist  . HIATAL HERNIA REPAIR    . ILIAC ARTERY STENT  12/15/2005   PTA and direct stenting rgt and lft common iliac arteries  . KYPHOPLASTY N/A 04/05/2019   Procedure: KYPHOPLASTY T12, L3, L4 L5;  Surgeon: Hessie Knows, MD;  Location: [facility] ORS;  Service: Orthopedics;  Laterality: N/A;  . NM GATED MYOVIEW ([place] [facility])  12/2017   Low Risk - no ischemia or infarct.  EF > 65%. no RWMA  . NM MYOVIEW ([place] [facility]X)  11/11/2010; February 2016   a) TM STRESS----NORMAL PERFUSION ,EF 68%; b) EF 50-55%. Normal LV function. No significant ischemia or infarction.  . OPEN REDUCTION INTERNAL FIXATION (ORIF) DISTAL RADIAL FRACTURE Left 01/13/2019   Procedure: OPEN REDUCTION INTERNAL FIXATION (ORIF) DISTAL  RADIAL FRACTURE - LEFT - SLEEP APNEA;  Surgeon: Hessie Knows, MD;  Location: [facility] ORS;  Service: Orthopedics;  Laterality: Left;  . ORIF ELBOW FRACTURE Right 01/01/2017   Procedure: OPEN REDUCTION INTERNAL FIXATION (ORIF) ELBOW/OLECRANON FRACTURE;  Surgeon: Hessie Knows, MD;  Location: [facility] ORS;  Service: Orthopedics;  Laterality: Right;  . ORIF WRIST FRACTURE Right 03/19/2015   Procedure: OPEN REDUCTION INTERNAL FIXATION (ORIF) WRIST FRACTURE;  Surgeon: Hessie Knows, MD;  Location: [facility] ORS;  Service: Orthopedics;  Laterality: Right;  . THORACIC AORTA - CAROTID ANGIOGRAM  October 2007   Dr. 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 TRANSTHORACIC ECHOCARDIOGRAM  February  2016   [facility]: Normal LV function. Dilated left atrium.    There were no vitals filed for this visit.  Subjective Assessment - 10/03/19 1305    Subjective  Patient reports compliance with HEP. No falls or LOB since last session. Feels his balance is improving however is still challenged with turning. Will be going to the doctor for his back soon.    Pertinent History  Patient referred to physical therapy for balance/strength issues s/p abdominal aortic aneurysm (AAA) without rupture repair on 05/18/19 .  Patient has been seen by this therapist in the past (last seen in march of 2020) Patient had a fall on 01/08/19, MRI from [facility] dated 03/28/19 report acute compression fractures at T12, L3 and L4 as well as facet arthropathy at the lower lumbar levels and at L3-4 moderate central stenosis. Patient underwent injection on 10/6 at L4-5 and L5-S1.  PMH includes AAA, arthritis, atherosclerotic heart disease, (s/p CABG), benign neoplasm of ascending/descending colon, COPD, CAD, dementia, dysrhythmia, frequent falls, GERD, hemorrhoid, HOH, HTN, PAF, prostate cancer. Monday through Friday have 3 people come in different times from 730 to 12pm. Uses a walker occasionally now    Limitations  Walking;Standing;House hold activities;Other (comment);Lifting    How long can you sit comfortably?  20 minutes before back is painful    How long can you stand comfortably?  hour    How long can you walk comfortably?  using a walker about 30 minutes    Patient Stated Goals  patient would like to improve his balance and strenght.    Currently in Pain?  Yes    Pain Score  3     Pain Location  Foot    Pain Orientation  Left    Pain Descriptors / Indicators  Aching    Pain Type  Chronic pain    Pain Onset  More than a month ago    Pain Frequency  Constant    Aggravating Factors   weightbearing    Pain Relieving Factors  nonweightbearing    Multiple Pain Sites  Yes    Pain Score  2    Pain Location  Back    Pain Orientation   Lower    Pain Descriptors / Indicators  Aching    Pain Type  Chronic pain    Pain Onset  More than a month ago    Pain Frequency  Intermittent    Aggravating Factors   first thing in the morning         [facility] PT Assessment - 10/03/19 0001      Standardized Balance Assessment   Standardized Balance Assessment  Berg Balance Test;Dynamic Gait Index      Berg Balance Test   Sit to Stand  Able to stand  independently using hands    Standing Unsupported  Able to stand safely 2 minutes    Sitting with Back Unsupported but Feet Supported on Floor or Stool  Able to sit safely and securely 2 minutes    Stand to Sit  Sits safely with minimal use of hands    Transfers  Able to transfer safely, minor use of hands    Standing Unsupported with Eyes Closed  Able to stand 10 seconds with supervision    Standing Unsupported with Feet Together  Able to place feet together independently and stand 1 minute safely    From Standing, Reach Forward with Outstretched Arm  Can reach forward >5 cm safely (2")    From Standing Position, Pick up Object from Floor  Able to pick up shoe safely and easily    From Standing Position, Turn to Look Behind Over each Shoulder  Looks behind from both sides and weight shifts well    Turn 360 Degrees  Able to turn 360 degrees safely one side only in 4 seconds or less    Standing Unsupported, Alternately Place Feet on Step/Stool  Able to stand independently and safely and complete 8 steps in 20 seconds    Standing Unsupported, One Foot in Front  Able to take small step independently and hold 30 seconds    Standing on One Leg  Tries to lift leg/unable to hold 3 seconds but remains standing independently    Total Score  46      Dynamic Gait Index   Level Surface  Normal    Change in Gait Speed  Normal  Gait with Horizontal Head Turns  Mild Impairment    Gait with Vertical Head Turns  Mild Impairment    Gait and Pivot Turn  Mild Impairment    Step Over Obstacle  Mild  Impairment    Step Around Obstacles  Mild Impairment    Steps  Mild Impairment    Total Score  18          RECERT BLE strength BERG: 46/56 DGI: 18/24      Access Code: [email or web address]  URL: [email or web address]  Date: 10/03/2019  Prepared by: Janna Arch   Exercises Standing March with Counter Support - 10 reps - 2 sets - 5 hold - 1x daily - 7x weekly Standing Tandem Balance with Counter Support - 10 reps - 2 sets - 5 hold - 1x daily - 7x weekly Standing Single Leg Stance with Unilateral Counter Support - 10 reps - 2 sets - 5 hold - 1x daily - 7x weekly       Patient presents with fatigue and pain limiting his ability to perform in session.  His balance was recently impaired by pain in foot in weightbearing and his BERG test was impaired by back pain this session. Severe fatigue and shortness of breath resulted in impaired gait and balance testing requiring frequent rest breaks with increased episodes of instability noted this session compared to previous sessions. . A trial period will allow for focused skilled intervention for return to pre injury/pain status and continued focus on decreasing fall risk.  The patient would benefit from continued skilled PT intervention to maximize mobility, safety, and functional abilities             PT Education - 10/03/19 1308    Education provided  Yes    Education Details  goals, POC    Person(s) Educated  Patient    Methods  Explanation;Demonstration;Tactile cues;Verbal cues    Comprehension  Verbalized understanding;Returned demonstration;Verbal cues required;Tactile cues required       PT Short Term Goals - 10/03/19 1312      PT SHORT TERM GOAL #1   Title  Patient will be independent in home exercise program to improve strength/mobility for better functional independence with ADLs.    Baseline  10/16: HEP given 11/17: HEP compliant    Time  4    Period  Weeks    Status  Achieved    Target Date  07/15/19       PT SHORT TERM GOAL #2   Title  Patient will deny any falls over past 4 weeks to demonstrate improved safety awareness at home and work.    Baseline  10/16: one major fall 11/17: no falls    Time  4    Period  Weeks    Status  Achieved    Target Date  07/15/19        PT Long Term Goals - 10/03/19 1423      PT LONG TERM GOAL #1   Title  Patient will increase BLE gross strength to 4+/5 as to improve functional strength for independent gait, increased standing tolerance and increased ADL ability.    Baseline  10/16: R gross 4-/5 with add/ext 3+/5 L grossly 3+/5 with hip add/ext 2+/5 11/17: R 4/5 add/ext; -/5 L 4-/5 hip add/ext 3/5 12/8: R: 4/5 L 4-/5 hip add/ext 3+/5 2/1: R 4+/5 L 4/5 with hip ext 4-/5    Time  8    Period  Weeks    Status  Partially Met  Target Date  11/28/19      PT LONG TERM GOAL #2   Title  Patient (> 5 years old) will complete five times sit to stand test in < 15 seconds indicating an increased LE strength and improved balance.    Baseline  10/16: 17 seconds with heavy use of hands on knees 11/17: 16 seconds one near LOB 12/8: 13 seconds one posterior LOB    Time  8    Period  Weeks    Status  Achieved      PT LONG TERM GOAL #3   Title  Patient will increase Berg Balance score by > 6 points (43/56)  to demonstrate decreased fall risk during functional activities.    Baseline  10/16: 37/56 11/17: 42/56 12/8: 47/56    Time  8    Period  Weeks    Status  Achieved      PT LONG TERM GOAL #4   Title  Patient will increase 10 meter walk test to >1.78ms as to improve gait speed for better community ambulation and to reduce fall risk.    Baseline  10/16: 0.91 w/o AD 11/17: 1.43 m/s with no AD    Time  8    Period  Weeks    Status  Achieved      PT LONG TERM GOAL #5   Title  Patient will increase ABC scale score >80% to demonstrate better functional mobility and better confidence with ADLs.    Baseline  10/16; 71% 11/17: 80% 12/8: 83%    Time  8     Period  Weeks    Status  Achieved      PT LONG TERM GOAL #6   Title  Patient will improve score on DGI to > or = 19/24 on the DGI to increase dynamic balance during mobility and to decrease risk of falls.    Baseline  12/8: perform next session 12/29: 18/24 2/1: 18/24    Time  8    Period  Weeks    Status  On-going    Target Date  11/28/19      PT LONG TERM GOAL #7   Title  Patient will increase Berg Balance score by > 6 points (53/56)  to demonstrate decreased fall risk during functional activities.    Baseline  12/8; 47/56 12/29: 49/56 2/1: 46/56    Time  8    Period  Weeks    Status  On-going    Target Date  11/28/19            Plan - 10/03/19 1423    Clinical Impression Statement  Patient presents with fatigue and pain limiting his ability to perform in session.  His balance was recently impaired by pain in foot in weightbearing and his BERG test was impaired by back pain this session. Severe fatigue and shortness of breath resulted in impaired gait and balance testing requiring frequent rest breaks with increased episodes of instability noted this session compared to previous sessions. . A trial period will allow for focused skilled intervention for return to pre injury/pain status and continued focus on decreasing fall risk.  The patient would benefit from continued skilled PT intervention to maximize mobility, safety, and functional abilities    Personal Factors and Comorbidities  Age;Comorbidity 3+;Fitness;Past/Current Experience;Sex;Social Background;Time since onset of injury/illness/exacerbation;Transportation    Comorbidities  AAA, arthritis, atherosclerotic heart disease, (s/p CABG), benign neoplasm of ascending/descending colon, COPD, CAD, dementia, dysrhythmia, frequent falls, GERD, hemorrhoid, HOH, HTN,  PAF, prostate cancer.    Examination-Activity Limitations  Bathing;Bend;Carry;Dressing;Continence;Stairs;Squat;Reach Overhead;Locomotion  Level;Lift;Stand;Transfers;Toileting    Examination-Participation Restrictions  Church;Cleaning;Community Activity;Driving;Interpersonal Relationship;Laundry;Volunteer;Shop;Personal Finances;Meal Prep;Yard Work    [employer] Potential  Fair    Clinical Impairments Affecting Rehab Potential  (+) good support system, high prior level of function (-) currently undergoing radiation therapy for prostate cancer, memory deficits     PT Frequency  2x / week    PT Duration  8 weeks    PT Treatment/Interventions  ADLs/Self Care Home Management;Aquatic Therapy;Cryotherapy;Moist Heat;Traction;Ultrasound;Therapeutic activities;Functional mobility training;Stair training;Gait training;DME Instruction;Therapeutic exercise;Balance training;Neuromuscular re-education;Patient/family education;Manual techniques;Passive range of motion;Energy conservation;Vestibular;Taping    PT Next Visit Plan  progress dynamic stability    PT Home Exercise Plan  see above    Consulted and Agree with Plan of Care  Patient       Patient will benefit from skilled therapeutic intervention in order to improve the following deficits and impairments:  Abnormal gait, Cardiopulmonary status limiting activity, Decreased activity tolerance, Decreased balance, Decreased knowledge of precautions, Decreased endurance, Decreased coordination, Decreased cognition, Decreased knowledge of use of DME, Decreased mobility, Difficulty walking, Decreased safety awareness, Decreased strength, Impaired flexibility, Impaired perceived functional ability, Impaired sensation, Postural dysfunction, Improper body mechanics, Pain  Visit Diagnosis: Unsteadiness on feet  Muscle weakness (generalized)  Other abnormalities of gait and mobility     Problem List Patient Active Problem List   Diagnosis Date Noted  . Status post abdominal aortic aneurysm (AAA) repair 04/15/2019  . Candidiasis 02/22/2019  . Malignant neoplasm of descending colon ([place])   . Benign  neoplasm of descending colon   . Benign neoplasm of transverse colon   . Do not intubate, cardiopulmonary resuscitation (CPR)-only code status 10/08/2018  . Prostate cancer ([place]) 02/25/2018  . Prostatic cyst 01/15/2018  . 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 ([place]) 09/15/2017  . Blood in stool   . Abnormal feces   . Stricture and stenosis of esophagus   . Mild cognitive impairment 08/18/2017  . Senile purpura ([place]) 04/29/2017  . Anemia 04/29/2017  . Frequent falls 04/29/2017  . Acute respiratory failure ([place]) 12/17/2016  . Simple chronic bronchitis ([place]) 12/17/2016  . Benign localized hyperplasia of prostate with urinary obstruction 07/15/2016  . Elevated PSA 07/15/2016  . Incomplete emptying of bladder 07/15/2016  . Urge incontinence 07/15/2016  . Hypothyroidism 04/25/2016  . Macular degeneration 04/25/2016  . Erectile dysfunction due to arterial insufficiency   . Ischemic heart disease with chronotropic incompetence   . CN (constipation) 02/10/2015  . DD (diverticular disease) 02/10/2015  . Fatigue 02/10/2015  . Lichen planus 16/96/7893  . Restless leg 02/10/2015  . 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 B12 deficiency 02/10/2015  . COPD (chronic obstructive pulmonary disease) ([place]) 11/14/2014  . Post Inflammatory Lung Changes 11/14/2014  . PAF (paroxysmal atrial fibrillation) ([facility]) CHA2DS2-VASc = 4. AC = Eliquis   . Insomnia 12/09/2013  . OSA on CPAP   . Dyspnea on exertion - -essentially resolved with BB dose reduction & wgt loss 03/29/2013  . Overweight (BMI 25.0-29.9) -- Wgt back up 01/17/2013  . Atherosclerotic heart disease of native coronary artery without angina pectoris - s/p CABG, occluded SVG-D1; patent LIMA-LAD, SVG-OM, SVG- RCA   . Essential hypertension   . Hyperlipidemia with target LDL less than 70   . Aorto-iliac disease ([place])   . BCC (basal cell carcinoma), eyelid 01/03/2013  . Allergic rhinitis 08/17/2009  . Adaptation reaction 05/28/2009  . Acid reflux  05/28/2009  . Arthritis, degenerative 05/28/2009  . Abnormality  of aortic arch branch 06/01/2006  . Carotid artery occlusion without infarction 06/01/2006  . PAD (peripheral artery disease) - bilateral common iliac stents 12/15/2005  . Aortoiliac occlusive disease ([place]) 11/30/2005  . Atherosclerotic heart disease of artery bypass graft 09/01/1997   Janna Arch, PT, DPT   10/03/2019, 2:26 PM  [place] MAIN [facility] SERVICES [address] [place], [place], [address] Phone: [phone]   Fax:  [phone]  Name: [name] MRN: [id number] Date of Birth: August 29, 1934

## 2019-10-07 ENCOUNTER — Ambulatory Visit: Payer: Medicare Other

## 2019-10-07 ENCOUNTER — Other Ambulatory Visit: Payer: Self-pay

## 2019-10-07 DIAGNOSIS — R2689 Other abnormalities of gait and mobility: Secondary | ICD-10-CM

## 2019-10-07 DIAGNOSIS — M6281 Muscle weakness (generalized): Secondary | ICD-10-CM

## 2019-10-07 DIAGNOSIS — R2681 Unsteadiness on feet: Secondary | ICD-10-CM | POA: Diagnosis not present

## 2019-10-07 NOTE — Therapy (Signed)
[place] MAIN [facility] SERVICES [address] [place], [place], [address] Phone: [phone]   Fax:  [phone]  Physical Therapy Treatment Physical Therapy Progress Note   Dates of reporting period  08/30/19   to   10/07/19  Patient Details  Name: [name] MRN: [id number] Date of Birth: [birth year] Referring Provider (PT): Eulogio Ditch   Encounter Date: 10/07/2019  PT End of Session - 10/07/19 1007    Visit Number  30    Number of Visits  45    Date for PT Re-Evaluation  11/28/19    Authorization Type  10/10 PN; next session 1/10 PN start 2/5    PT Start Time  1000    PT Stop Time  1042    PT Time Calculation (min)  42 min    Equipment Utilized During Treatment  Gait belt    Activity Tolerance  Patient tolerated treatment well    Behavior During Therapy  WFL for tasks assessed/performed       Past Medical History:  Diagnosis Date  . AAA (abdominal aortic aneurysm) ([place])   . Arthritis   . Atherosclerotic heart disease of native coronary artery without angina pectoris 1998   - s/p CABG, occluded SVG-D1; patent LIMA-LAD, SVG-OM, SVG- RCA  . Basal cell carcinoma of eyelid 01/03/2013   2019 - R side of Nose  . Benign neoplasm of ascending colon   . Benign neoplasm of descending colon   . Cancer [facility])    Skin cancer  . Colon polyps   . COPD (chronic obstructive pulmonary disease) ([place])   . Coronary artery disease   . Dementia ([place])   . Dysrhythmia    atrial fib  . Falls frequently   . GERD (gastroesophageal reflux disease)   . Hemorrhoid 02/10/2015  . History of hiatal hernia   . History of kidney stones   . HOH (hard of hearing)   . Hypertension   . Memory loss   . Osteoporosis   . PAF (paroxysmal atrial fibrillation) ([place])   . Prostate cancer ([place])   . Prostate disorder   . Sciatic pain    Chronic  . Sleep apnea    cpap  . Temporary cerebral vascular dysfunction 02/10/2015   Had negative work-up.  July, 2012.  No  medication changes, had forgot them that day, got dehydrated.   . Urinary incontinence     Past Surgical History:  Procedure Laterality Date  . ABDOMINAL AORTIC ENDOVASCULAR STENT GRAFT Bilateral 05/18/2019   Procedure: ABDOMINAL AORTIC ENDOVASCULAR STENT GRAFT;  Surgeon: Algernon Huxley, MD;  Location: [facility] ORS;  Service: Vascular;  Laterality: Bilateral;  . ANKLE SURGERY    . Arch aortogram and carotid aortogram  06/02/2006   Dr Oneida Alar did surgery  . BASAL CELL CARCINOMA EXCISION  04/2016   Dermatology  . CARDIAC CATHETERIZATION  01/30/1998    (Dr. Linard Millers): Native LAD & mRCA CTO. 90% D1. Mod Cx. SVG-D1 CTO. SVG-RCA & SVG-OM along with LIMA-LAD patent;  LV NORMAL Fxn  . CAROTID ENDARTERECTOMY Left Oct. 29, 2007   Dr. Oneida Alar:  . CATARACT EXTRACTION W/PHACO Right 03/05/2017   Procedure: CATARACT EXTRACTION PHACO AND INTRAOCULAR LENS PLACEMENT (IOC);  Surgeon: Leandrew Koyanagi, MD;  Location: [facility] ORS;  Service: Ophthalmology;  Laterality: Right;  [place] 00:58.5 AP% 10.6 CDE 6.20 Fluid Pack lot # [id number]  . CATARACT EXTRACTION W/PHACO Left 04/15/2017   Procedure: CATARACT EXTRACTION PHACO AND INTRAOCULAR LENS PLACEMENT (IOC)  Left;  Surgeon: Leandrew Koyanagi, MD;  Location: [place];  Service: Ophthalmology;  Laterality: Left;  . COLONOSCOPY WITH PROPOFOL N/A 09/15/2017   Procedure: COLONOSCOPY WITH PROPOFOL;  Surgeon: Lucilla Lame, MD;  Location: [facility] ENDOSCOPY;  Service: Endoscopy;  Laterality: N/A;  . COLONOSCOPY WITH PROPOFOL N/A 10/26/2018   Procedure: COLONOSCOPY WITH PROPOFOL;  Surgeon: Lucilla Lame, MD;  Location: [facility] ENDOSCOPY;  Service: Endoscopy;  Laterality: N/A;  . CORONARY ARTERY BYPASS GRAFT  1998   LIMA-LAD, SVG-OM, SVG-RPDA, SVG-DIAG  . CPET / MET  12/2012   Mild chronotropic incompetence - read 82% of predicted; also reduced effort; peak VO2 15.7 / 75% (did not reach Max effort) -- suggested ischemic response in last 1.5 minutes of exercise. Normal pulmonary  function on PFTs but poor response to  . DOPPLER ECHOCARDIOGRAPHY  11/20/2010   EF =>55%; LV norm mild aortic scelorosis  . ENDOVASCULAR REPAIR/STENT GRAFT N/A 05/18/2019   Procedure: ENDOVASCULAR REPAIR/STENT GRAFT;  Surgeon: Algernon Huxley, MD;  Location: [place] [facility] LAB;  Service: Cardiovascular;  Laterality: N/A;  . ESOPHAGOGASTRODUODENOSCOPY (EGD) WITH PROPOFOL N/A 09/15/2017   Procedure: ESOPHAGOGASTRODUODENOSCOPY (EGD) WITH PROPOFOL;  Surgeon: Lucilla Lame, MD;  Location: [facility] ENDOSCOPY;  Service: Endoscopy;  Laterality: N/A;  . FRACTURE SURGERY Right 03/19/2015   wrist  . HIATAL HERNIA REPAIR    . ILIAC ARTERY STENT  12/15/2005   PTA and direct stenting rgt and lft common iliac arteries  . KYPHOPLASTY N/A 04/05/2019   Procedure: KYPHOPLASTY T12, L3, L4 L5;  Surgeon: Hessie Knows, MD;  Location: [facility] ORS;  Service: Orthopedics;  Laterality: N/A;  . NM GATED MYOVIEW ([place] [facility])  12/2017   Low Risk - no ischemia or infarct.  EF > 65%. no RWMA  . NM MYOVIEW ([place] [facility])  11/11/2010; February 2016   a) TM STRESS----NORMAL PERFUSION ,EF 68%; b) EF 50-55%. Normal LV function. No significant ischemia or infarction.  . OPEN REDUCTION INTERNAL FIXATION (ORIF) DISTAL RADIAL FRACTURE Left 01/13/2019   Procedure: OPEN REDUCTION INTERNAL FIXATION (ORIF) DISTAL  RADIAL FRACTURE - LEFT - SLEEP APNEA;  Surgeon: Hessie Knows, MD;  Location: [facility] ORS;  Service: Orthopedics;  Laterality: Left;  . ORIF ELBOW FRACTURE Right 01/01/2017   Procedure: OPEN REDUCTION INTERNAL FIXATION (ORIF) ELBOW/OLECRANON FRACTURE;  Surgeon: Hessie Knows, MD;  Location: [facility] ORS;  Service: Orthopedics;  Laterality: Right;  . ORIF WRIST FRACTURE Right 03/19/2015   Procedure: OPEN REDUCTION INTERNAL FIXATION (ORIF) WRIST FRACTURE;  Surgeon: Hessie Knows, MD;  Location: [facility] ORS;  Service: Orthopedics;  Laterality: Right;  . THORACIC AORTA - CAROTID ANGIOGRAM  October 2007   Dr. 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 TRANSTHORACIC ECHOCARDIOGRAM  February 2016   [facility]: Normal LV function. Dilated left atrium.    There were no vitals filed for this visit.  Subjective Assessment - 10/07/19 1005    Subjective  Patient reports his foot is hurting him today, had a bad out of body experience last night and is very fatigued this morning.    Pertinent History  Patient referred to physical therapy for balance/strength issues s/p abdominal aortic aneurysm (AAA) without rupture repair on 05/18/19 .  Patient has been seen by this therapist in the past (last seen in march of 2020) Patient had a fall on 01/08/19, MRI from [facility] dated 03/28/19 report acute compression fractures at T12, L3 and L4 as well as facet arthropathy at the lower lumbar levels and  at L3-4 moderate central stenosis. Patient underwent injection on 10/6 at L4-5 and L5-S1. PMH includes AAA, arthritis, atherosclerotic heart disease, (s/p CABG), benign neoplasm of ascending/descending colon, COPD, CAD, dementia, dysrhythmia, frequent falls, GERD, hemorrhoid, HOH, HTN, PAF, prostate cancer. Monday through Friday have 3 people come in different times from 730 to 12pm. Uses a walker occasionally now    Limitations  Walking;Standing;House hold activities;Other (comment);Lifting    How long can you sit comfortably?  20 minutes before back is painful    How long can you stand comfortably?  hour    How long can you walk comfortably?  using a walker about 30 minutes    Patient Stated Goals  patient would like to improve his balance and strenght.    Currently in Pain?  Yes    Pain Score  6     Pain Location  Foot    Pain Orientation  Left    Pain Descriptors / Indicators  Aching;Stabbing    Pain Type  Chronic pain    Pain Onset  More than a month ago    Pain Frequency  Constant    Aggravating Factors   weightbearing       Progress note, goals performed 10/03/19  Nustep Lvl 3 seat 12 RPM>  60 ; 4 minutes for cardiovascular challenge.   In // bars: CGA for stability and safety awareness for decreased fall risk. Cueing for task orientation and body mechanics.    -Half foam roller; heel toe rocker with UE support cueing for upright posture. 10x    - modified tandem stance, one foot on an airex pad of each color, 30 second holds x 2 trials each LE, more challenging with LLE requiring Min A for maintaining upright posture due to forward trunk flexion   airex pad: 6" step toe taps no UE support, 10x each LE; occasional LOB requiring Min A to return to COM  Step over orange hurdle and back 10x each LE SUE support   Seated: cues for body mechanics and neutral alignment  --seated scapular retraction with RTB row 10x  -dynadisc circles clockwise 10x , counterclockwise 10x each LE   -rainbow ball adduction squeezes, cueing for increased squeezing   -RTB 4 way ankle: df, pf, IR, ER 10x each direction, each LE; cueing for neutral knee alignment for optimal muscle recruitment   -LAQ with rainbow ball between feet for adduction activation 10x  -RTB hamstring curl 15x each LE   -RTB abduction 15x    4lb ankle weights: -seated marching 12x each LE, focus on muscle activation and height of hip flexion for improved foto clearance with carryover to ambulation. 2 sets -LAQ 15x each LE; cueing for amplitude of movement      Pt educated throughout session about proper posture and technique with exercises. Improved exercise technique, movement at target joints, use of target muscles after min to mod verbal, visual, tactile cues.    Patient's condition has the potential to improve in response to therapy. Maximum improvement is yet to be obtained. The anticipated improvement is attainable and reasonable in a generally predictable time.  Patient reports he is getting stronger and able to walk better but due to his recent challenges with his foot needs work on his balance.                        PT Education - 10/07/19 1006    Education provided  Yes    Education Details  exercise technique, body mechanics    Person(s) Educated  Patient    Methods  Explanation;Demonstration;Tactile cues;Verbal cues    Comprehension  Verbalized understanding;Returned demonstration;Verbal cues required;Tactile cues required       PT Short Term Goals - 10/03/19 1312      PT SHORT TERM GOAL #1   Title  Patient will be independent in home exercise program to improve strength/mobility for better functional independence with ADLs.    Baseline  10/16: HEP given 11/17: HEP compliant    Time  4    Period  Weeks    Status  Achieved    Target Date  07/15/19      PT SHORT TERM GOAL #2   Title  Patient will deny any falls over past 4 weeks to demonstrate improved safety awareness at home and work.    Baseline  10/16: one major fall 11/17: no falls    Time  4    Period  Weeks    Status  Achieved    Target Date  07/15/19        PT Long Term Goals - 10/03/19 1423      PT LONG TERM GOAL #1   Title  Patient will increase BLE gross strength to 4+/5 as to improve functional strength for independent gait, increased standing tolerance and increased ADL ability.    Baseline  10/16: R gross 4-/5 with add/ext 3+/5 L grossly 3+/5 with hip add/ext 2+/5 11/17: R 4/5 add/ext; -/5 L 4-/5 hip add/ext 3/5 12/8: R: 4/5 L 4-/5 hip add/ext 3+/5 2/1: R 4+/5 L 4/5 with hip ext 4-/5    Time  8    Period  Weeks    Status  Partially Met    Target Date  11/28/19      PT LONG TERM GOAL #2   Title  Patient (> 38 years old) will complete five times sit to stand test in < 15 seconds indicating an increased LE strength and improved balance.    Baseline  10/16: 17 seconds with heavy use of hands on knees 11/17: 16 seconds one near LOB 12/8: 13 seconds one posterior LOB    Time  8    Period  Weeks    Status  Achieved      PT LONG TERM GOAL #3   Title  Patient will increase Berg  Balance score by > 6 points (43/56)  to demonstrate decreased fall risk during functional activities.    Baseline  10/16: 37/56 11/17: 42/56 12/8: 47/56    Time  8    Period  Weeks    Status  Achieved      PT LONG TERM GOAL #4   Title  Patient will increase 10 meter walk test to >1.32ms as to improve gait speed for better community ambulation and to reduce fall risk.    Baseline  10/16: 0.91 w/o AD 11/17: 1.43 m/s with no AD    Time  8    Period  Weeks    Status  Achieved      PT LONG TERM GOAL #5   Title  Patient will increase ABC scale score >80% to demonstrate better functional mobility and better confidence with ADLs.    Baseline  10/16; 71% 11/17: 80% 12/8: 83%    Time  8    Period  Weeks    Status  Achieved      PT LONG TERM GOAL #6   Title  Patient will improve score on  DGI to > or = 19/24 on the DGI to increase dynamic balance during mobility and to decrease risk of falls.    Baseline  12/8: perform next session 12/29: 18/24 2/1: 18/24    Time  8    Period  Weeks    Status  On-going    Target Date  11/28/19      PT LONG TERM GOAL #7   Title  Patient will increase Berg Balance score by > 6 points (53/56)  to demonstrate decreased fall risk during functional activities.    Baseline  12/8; 47/56 12/29: 49/56 2/1: 46/56    Time  8    Period  Weeks    Status  On-going    Target Date  11/28/19            Plan - 10/07/19 1019    Clinical Impression Statement  Patient's goals performed on 10/03/19, last session, please refer to this note for progression and further information. Today's session limited by foot pain and fatigue however patient remained motivated throughout session. Continued focus on stability interventions with ankle righting reactions will benefit the patient.Patient's condition has the potential to improve in response to therapy. Maximum improvement is yet to be obtained. The anticipated improvement is attainable and reasonable in a generally predictable  time. The patient would benefit from continued skilled PT intervention to maximize mobility, safety, and functional abilities    Personal Factors and Comorbidities  Age;Comorbidity 3+;Fitness;Past/Current Experience;Sex;Social Background;Time since onset of injury/illness/exacerbation;Transportation    Comorbidities  AAA, arthritis, atherosclerotic heart disease, (s/p CABG), benign neoplasm of ascending/descending colon, COPD, CAD, dementia, dysrhythmia, frequent falls, GERD, hemorrhoid, HOH, HTN, PAF, prostate cancer.    Examination-Activity Limitations  Bathing;Bend;Carry;Dressing;Continence;Stairs;Squat;Reach Overhead;Locomotion Level;Lift;Stand;Transfers;Toileting    Examination-Participation Restrictions  Church;Cleaning;Community Activity;Driving;Interpersonal Relationship;Laundry;Volunteer;Shop;Personal Finances;Meal Prep;Yard Work    [employer] Potential  Fair    Clinical Impairments Affecting Rehab Potential  (+) good support system, high prior level of function (-) currently undergoing radiation therapy for prostate cancer, memory deficits     PT Frequency  2x / week    PT Duration  8 weeks    PT Treatment/Interventions  ADLs/Self Care Home Management;Aquatic Therapy;Cryotherapy;Moist Heat;Traction;Ultrasound;Therapeutic activities;Functional mobility training;Stair training;Gait training;DME Instruction;Therapeutic exercise;Balance training;Neuromuscular re-education;Patient/family education;Manual techniques;Passive range of motion;Energy conservation;Vestibular;Taping    PT Next Visit Plan  progress dynamic stability    PT Home Exercise Plan  see above    Consulted and Agree with Plan of Care  Patient       Patient will benefit from skilled therapeutic intervention in order to improve the following deficits and impairments:  Abnormal gait, Cardiopulmonary status limiting activity, Decreased activity tolerance, Decreased balance, Decreased knowledge of precautions, Decreased endurance,  Decreased coordination, Decreased cognition, Decreased knowledge of use of DME, Decreased mobility, Difficulty walking, Decreased safety awareness, Decreased strength, Impaired flexibility, Impaired perceived functional ability, Impaired sensation, Postural dysfunction, Improper body mechanics, Pain  Visit Diagnosis: Unsteadiness on feet  Muscle weakness (generalized)  Other abnormalities of gait and mobility     Problem List Patient Active Problem List   Diagnosis Date Noted  . Status post abdominal aortic aneurysm (AAA) repair 04/15/2019  . Candidiasis 02/22/2019  . Malignant neoplasm of descending colon ([place])   . Benign neoplasm of descending colon   . Benign neoplasm of transverse colon   . Do not intubate, cardiopulmonary resuscitation (CPR)-only code status 10/08/2018  . Prostate cancer ([place]) 02/25/2018  . Prostatic cyst 01/15/2018  . 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 ([address]) 09/15/2017  . Blood in  stool   . Abnormal feces   . Stricture and stenosis of esophagus   . Mild cognitive impairment 08/18/2017  . Senile purpura ([place]) 04/29/2017  . Anemia 04/29/2017  . Frequent falls 04/29/2017  . Acute respiratory failure ([place]) 12/17/2016  . Simple chronic bronchitis ([place]) 12/17/2016  . Benign localized hyperplasia of prostate with urinary obstruction 07/15/2016  . Elevated PSA 07/15/2016  . Incomplete emptying of bladder 07/15/2016  . Urge incontinence 07/15/2016  . Hypothyroidism 04/25/2016  . Macular degeneration 04/25/2016  . Erectile dysfunction due to arterial insufficiency   . Ischemic heart disease with chronotropic incompetence   . CN (constipation) 02/10/2015  . DD (diverticular disease) 02/10/2015  . Fatigue 02/10/2015  . Lichen planus 16/06/9603  . Restless leg 02/10/2015  . 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 B12 deficiency 02/10/2015  . COPD (chronic obstructive pulmonary disease) ([place]) 11/14/2014  . Post Inflammatory Lung Changes 11/14/2014  . PAF  (paroxysmal atrial fibrillation) ([facility]) CHA2DS2-VASc = 4. AC = Eliquis   . Insomnia 12/09/2013  . OSA on CPAP   . Dyspnea on exertion - -essentially resolved with BB dose reduction & wgt loss 03/29/2013  . Overweight (BMI 25.0-29.9) -- Wgt back up 01/17/2013  . Atherosclerotic heart disease of native coronary artery without angina pectoris - s/p CABG, occluded SVG-D1; patent LIMA-LAD, SVG-OM, SVG- RCA   . Essential hypertension   . Hyperlipidemia with target LDL less than 70   . Aorto-iliac disease ([place])   . BCC (basal cell carcinoma), eyelid 01/03/2013  . Allergic rhinitis 08/17/2009  . Adaptation reaction 05/28/2009  . Acid reflux 05/28/2009  . Arthritis, degenerative 05/28/2009  . 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 Carotid artery occlusion without infarction 06/01/2006  . PAD (peripheral artery disease) - bilateral common iliac stents 12/15/2005  . Aortoiliac occlusive disease ([place]) 11/30/2005  . Atherosclerotic heart disease of artery bypass graft 09/01/1997   Janna Arch, PT, DPT   10/07/2019, 10:44 AM  [place] MAIN [facility] SERVICES [address] [place] [employer] [place], [place], [address] Phone: [phone]   Fax:  [phone]  Name: DERIC BOCOCK MRN: [id number] Date of Birth: July 06, 1934

## 2019-10-11 ENCOUNTER — Ambulatory Visit: Payer: Medicare Other

## 2019-10-11 ENCOUNTER — Other Ambulatory Visit: Payer: Self-pay

## 2019-10-11 DIAGNOSIS — R2681 Unsteadiness on feet: Secondary | ICD-10-CM | POA: Diagnosis not present

## 2019-10-11 DIAGNOSIS — M6281 Muscle weakness (generalized): Secondary | ICD-10-CM

## 2019-10-11 DIAGNOSIS — R2689 Other abnormalities of gait and mobility: Secondary | ICD-10-CM

## 2019-10-11 DIAGNOSIS — R262 Difficulty in walking, not elsewhere classified: Secondary | ICD-10-CM

## 2019-10-11 NOTE — Therapy (Signed)
[place] MAIN [facility] SERVICES [address] [place], [place], [address] Phone: [phone]   Fax:  [phone]  Physical Therapy Treatment  Patient Details  Name: [name] MRN: [id number] Date of Birth: 18-Feb-1934 Referring Provider (PT): Eulogio Ditch   Encounter Date: 10/11/2019  PT End of Session - 10/11/19 1139    Visit Number  31    Number of Visits  45    Date for PT Re-Evaluation  11/28/19    Authorization Type  1/10 PN start 2/5    PT Start Time  1115    PT Stop Time  1159    PT Time Calculation (min)  44 min    Equipment Utilized During Treatment  Gait belt    Activity Tolerance  Patient tolerated treatment well    Behavior During Therapy  [facility] for tasks assessed/performed       Past Medical History:  Diagnosis Date  . AAA (abdominal aortic aneurysm) ([place])   . Arthritis   . Atherosclerotic heart disease of native coronary artery without angina pectoris 1998   - s/p CABG, occluded SVG-D1; patent LIMA-LAD, SVG-OM, SVG- RCA  . Basal cell carcinoma of eyelid 01/03/2013   2019 - R side of Nose  . Benign neoplasm of ascending colon   . Benign neoplasm of descending colon   . Cancer [facility])    Skin cancer  . Colon polyps   . COPD (chronic obstructive pulmonary disease) ([place])   . Coronary artery disease   . Dementia ([place])   . Dysrhythmia    atrial fib  . Falls frequently   . GERD (gastroesophageal reflux disease)   . Hemorrhoid 02/10/2015  . History of hiatal hernia   . History of kidney stones   . HOH (hard of hearing)   . Hypertension   . Memory loss   . Osteoporosis   . PAF (paroxysmal atrial fibrillation) ([place])   . Prostate cancer ([place])   . Prostate disorder   . Sciatic pain    Chronic  . Sleep apnea    cpap  . Temporary cerebral vascular dysfunction 02/10/2015   Had negative work-up.  July, 2012.  No medication changes, had forgot them that day, got dehydrated.   . Urinary incontinence     Past Surgical  History:  Procedure Laterality Date  . ABDOMINAL AORTIC ENDOVASCULAR STENT GRAFT Bilateral 05/18/2019   Procedure: ABDOMINAL AORTIC ENDOVASCULAR STENT GRAFT;  Surgeon: Algernon Huxley, MD;  Location: [facility] ORS;  Service: Vascular;  Laterality: Bilateral;  . ANKLE SURGERY    . Arch aortogram and carotid aortogram  06/02/2006   Dr Oneida Alar did surgery  . BASAL CELL CARCINOMA EXCISION  04/2016   Dermatology  . CARDIAC CATHETERIZATION  01/30/1998    (Dr. Linard Millers): Native LAD & mRCA CTO. 90% D1. Mod Cx. SVG-D1 CTO. SVG-RCA & SVG-OM along with LIMA-LAD patent;  LV NORMAL Fxn  . CAROTID ENDARTERECTOMY Left Oct. 29, 2007   Dr. Oneida Alar:  . CATARACT EXTRACTION W/PHACO Right 03/05/2017   Procedure: CATARACT EXTRACTION PHACO AND INTRAOCULAR LENS PLACEMENT (IOC);  Surgeon: Leandrew Koyanagi, MD;  Location: [facility] ORS;  Service: Ophthalmology;  Laterality: Right;  [place] 00:58.5 AP% 10.6 CDE 6.20 Fluid Pack lot # [id number]  . CATARACT EXTRACTION W/PHACO Left 04/15/2017   Procedure: CATARACT EXTRACTION PHACO AND INTRAOCULAR LENS PLACEMENT ([place])  Left;  Surgeon: Leandrew Koyanagi, MD;  Location: [place];  Service: Ophthalmology;  Laterality: Left;  . COLONOSCOPY WITH PROPOFOL N/A 09/15/2017  Procedure: COLONOSCOPY WITH PROPOFOL;  Surgeon: Lucilla Lame, MD;  Location: [facility] ENDOSCOPY;  Service: Endoscopy;  Laterality: N/A;  . COLONOSCOPY WITH PROPOFOL N/A 10/26/2018   Procedure: COLONOSCOPY WITH PROPOFOL;  Surgeon: Lucilla Lame, MD;  Location[facility]COPY;  Service: Endoscopy;  Laterality: N/A;  . CORONARY ARTERY BYPASS GRAFT  1998   LIMA-LAD, SVG-OM, SVG-RPDA, SVG-DIAG  . CPET / MET  12/2012   Mild chronotropic incompetence - read 82% of predicted; also reduced effort; peak VO2 15.7 / 75% (did not reach Max effort) -- suggested ischemic response in last 1.5 minutes of exercise. Normal pulmonary function on PFTs but poor response to  . DOPPLER ECHOCARDIOGRAPHY  11/20/2010   EF =>55%; LV norm mild aortic  scelorosis  . ENDOVASCULAR REPAIR/STENT GRAFT N/A 05/18/2019   Procedure: ENDOVASCULAR REPAIR/STENT GRAFT;  Surgeon: Algernon Huxley, MD;  Location: [place] CV LAB;  Service: Cardiovascular;  Laterality: N/A;  . ESOPHAGOGASTRODUODENOSCOPY (EGD) WITH PROPOFOL N/A 09/15/2017   Procedure: ESOPHAGOGASTRODUODENOSCOPY (EGD) WITH PROPOFOL;  Surgeon: Lucilla Lame, MD;  Location: [facility] ENDOSCOPY;  Service: Endoscopy;  Laterality: N/A;  . FRACTURE SURGERY Right 03/19/2015   wrist  . HIATAL HERNIA REPAIR    . ILIAC ARTERY STENT  12/15/2005   PTA and direct stenting rgt and lft common iliac arteries  . KYPHOPLASTY N/A 04/05/2019   Procedure: KYPHOPLASTY T12, L3, L4 L5;  Surgeon: Hessie Knows, MD;  Location: [facility] ORS;  Service: Orthopedics;  Laterality: N/A;  . NM GATED MYOVIEW ([place] [facility])  12/2017   Low Risk - no ischemia or infarct.  EF > 65%. no RWMA  . NM MYOVIEW ([place] [facility])  11/11/2010; February 2016   a) TM STRESS----NORMAL PERFUSION ,EF 68%; b) EF 50-55%. Normal LV function. No significant ischemia or infarction.  . OPEN REDUCTION INTERNAL FIXATION (ORIF) DISTAL RADIAL FRACTURE Left 01/13/2019   Procedure: OPEN REDUCTION INTERNAL FIXATION (ORIF) DISTAL  RADIAL FRACTURE - LEFT - SLEEP APNEA;  Surgeon: Hessie Knows, MD;  Location: [facility] ORS;  Service: Orthopedics;  Laterality: Left;  . ORIF ELBOW FRACTURE Right 01/01/2017   Procedure: OPEN REDUCTION INTERNAL FIXATION (ORIF) ELBOW/OLECRANON FRACTURE;  Surgeon: Hessie Knows, MD;  Location: [facility] ORS;  Service: Orthopedics;  Laterality: Right;  . ORIF WRIST FRACTURE Right 03/19/2015   Procedure: OPEN REDUCTION INTERNAL FIXATION (ORIF) WRIST FRACTURE;  Surgeon: Hessie Knows, MD;  Location: [facility] ORS;  Service: Orthopedics;  Laterality: Right;  . THORACIC AORTA - CAROTID ANGIOGRAM  October 2007   Dr. 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 TRANSTHORACIC ECHOCARDIOGRAM  February  2016   [facility]: Normal LV function. Dilated left atrium.    There were no vitals filed for this visit.  Subjective Assessment - 10/11/19 1137    Subjective  Patient reports having another out of body experience last night causing him to be very fatigued. Got new orthotics last week and is having foot pain. No falls or LOB since last session.    Pertinent History  Patient referred to physical therapy for balance/strength issues s/p abdominal aortic aneurysm (AAA) without rupture repair on 05/18/19 .  Patient has been seen by this therapist in the past (last seen in march of 2020) Patient had a fall on 01/08/19, MRI from [facility] dated 03/28/19 report acute compression fractures at T12, L3 and L4 as well as facet arthropathy at the lower lumbar levels and at L3-4 moderate central stenosis. Patient underwent injection on 10/6 at L4-5 and L5-S1.  PMH includes AAA, arthritis, atherosclerotic heart disease, (s/p CABG), benign neoplasm of ascending/descending colon, COPD, CAD, dementia, dysrhythmia, frequent falls, GERD, hemorrhoid, HOH, HTN, PAF, prostate cancer. Monday through Friday have 3 people come in different times from 730 to 12pm. Uses a walker occasionally now    Limitations  Walking;Standing;House hold activities;Other (comment);Lifting    How long can you sit comfortably?  20 minutes before back is painful    How long can you stand comfortably?  hour    How long can you walk comfortably?  using a walker about 30 minutes    Patient Stated Goals  patient would like to improve his balance and strenght.    Currently in Pain?  Yes    Pain Score  5     Pain Location  Foot    Pain Orientation  Left    Pain Descriptors / Indicators  Aching    Pain Type  Chronic pain    Pain Onset  More than a month ago    Pain Frequency  Constant    Pain Score  2    Pain Location  Back    Pain Orientation  Lower    Pain Descriptors / Indicators  Aching    Pain Type  Chronic pain    Pain Onset  More than a month ago     Pain Frequency  Intermittent          Patient reports 5/10 foot pain today and had an out of body experience last night causing him to be fatigued this session.  Standing with support surface nearby:CGA for stability and safety awareness for decreased fall risk. Cueing for task orientation and body mechanics. Airex pad: one foot on 6" step one foot on airex pad 2x 30 second holds .   airex pad; static stand with horizontal head turns 60 seconds, x2 trials    Walk with horizontal head turns with CGA and close cueing x 30 ft x2 trials, narrowing of BOS with horizontal head turn   Side step over orange hurdle SUE support 10x each side, very challenging    Step over orange hurdle and back 10x each LE. SUE support, seated rest break between LE's.  3lb ankle weight:  -marching with BUE support 15x each LE  -hip extension with BUE support 15x each LE -hip abduction with BUE support 15x each LE  Ambulate 90 ft with guidance of touching PT elbow with cueing for widening BOS  With CGA   Seated: cues for body mechanics and neutral alignment  3lb ankle weight: Marching with upright posture without back support 10x each LE    Pt educated throughout session about proper posture and technique with exercises. Improved exercise technique, movement at target joints, use of target muscles after min to mod verbal, visual, tactile cues.                  PT Education - 10/11/19 1138    Education provided  Yes    Education Details  exercise technique, body mechanics    Person(s) Educated  Patient    Methods  Explanation;Demonstration;Tactile cues;Verbal cues    Comprehension  Verbalized understanding;Returned demonstration;Verbal cues required;Tactile cues required       PT Short Term Goals - 10/03/19 1312      PT SHORT TERM GOAL #1   Title  Patient will be independent in home exercise program to improve strength/mobility for better functional independence with ADLs.     Baseline  10/16: HEP  given 11/17: HEP compliant    Time  4    Period  Weeks    Status  Achieved    Target Date  07/15/19      PT SHORT TERM GOAL #2   Title  Patient will deny any falls over past 4 weeks to demonstrate improved safety awareness at home and work.    Baseline  10/16: one major fall 11/17: no falls    Time  4    Period  Weeks    Status  Achieved    Target Date  07/15/19        PT Long Term Goals - 10/03/19 1423      PT LONG TERM GOAL #1   Title  Patient will increase BLE gross strength to 4+/5 as to improve functional strength for independent gait, increased standing tolerance and increased ADL ability.    Baseline  10/16: R gross 4-/5 with add/ext 3+/5 L grossly 3+/5 with hip add/ext 2+/5 11/17: R 4/5 add/ext; -/5 L 4-/5 hip add/ext 3/5 12/8: R: 4/5 L 4-/5 hip add/ext 3+/5 2/1: R 4+/5 L 4/5 with hip ext 4-/5    Time  8    Period  Weeks    Status  Partially Met    Target Date  11/28/19      PT LONG TERM GOAL #2   Title  Patient (> 15 years old) will complete five times sit to stand test in < 15 seconds indicating an increased LE strength and improved balance.    Baseline  10/16: 17 seconds with heavy use of hands on knees 11/17: 16 seconds one near LOB 12/8: 13 seconds one posterior LOB    Time  8    Period  Weeks    Status  Achieved      PT LONG TERM GOAL #3   Title  Patient will increase Berg Balance score by > 6 points (43/56)  to demonstrate decreased fall risk during functional activities.    Baseline  10/16: 37/56 11/17: 42/56 12/8: 47/56    Time  8    Period  Weeks    Status  Achieved      PT LONG TERM GOAL #4   Title  Patient will increase 10 meter walk test to >1.35ms as to improve gait speed for better community ambulation and to reduce fall risk.    Baseline  10/16: 0.91 w/o AD 11/17: 1.43 m/s with no AD    Time  8    Period  Weeks    Status  Achieved      PT LONG TERM GOAL #5   Title  Patient will increase ABC scale score >80% to demonstrate  better functional mobility and better confidence with ADLs.    Baseline  10/16; 71% 11/17: 80% 12/8: 83%    Time  8    Period  Weeks    Status  Achieved      PT LONG TERM GOAL #6   Title  Patient will improve score on DGI to > or = 19/24 on the DGI to increase dynamic balance during mobility and to decrease risk of falls.    Baseline  12/8: perform next session 12/29: 18/24 2/1: 18/24    Time  8    Period  Weeks    Status  On-going    Target Date  11/28/19      PT LONG TERM GOAL #7   Title  Patient will increase Berg Balance score by > 6  points (53/56)  to demonstrate decreased fall risk during functional activities.    Baseline  12/8; 47/56 12/29: 49/56 2/1: 46/56    Time  8    Period  Weeks    Status  On-going    Target Date  11/28/19            Plan - 10/11/19 1155    Clinical Impression Statement  Frequent rest breaks required due to fatigue this session. Patient is more challenged with task orientation/sequencing this session requiring more frequent cueing than previous sessions however continues to be motivated to improve his stability. Continued focus on stability interventions with ankle righting reactions will benefit the patient. The patient would benefit from continued skilled PT intervention to maximize mobility, safety, and functional abilities    Personal Factors and Comorbidities  Age;Comorbidity 3+;Fitness;Past/Current Experience;Sex;Social Background;Time since onset of injury/illness/exacerbation;Transportation    Comorbidities  AAA, arthritis, atherosclerotic heart disease, (s/p CABG), benign neoplasm of ascending/descending colon, COPD, CAD, dementia, dysrhythmia, frequent falls, GERD, hemorrhoid, HOH, HTN, PAF, prostate cancer.    Examination-Activity Limitations  Bathing;Bend;Carry;Dressing;Continence;Stairs;Squat;Reach Overhead;Locomotion Level;Lift;Stand;Transfers;Toileting    Examination-Participation Restrictions  Church;Cleaning;Community  Activity;Driving;Interpersonal Relationship;Laundry;Volunteer;Shop;Personal Finances;Meal Prep;Yard Work    [employer] Potential  Fair    Clinical Impairments Affecting Rehab Potential  (+) good support system, high prior level of function (-) currently undergoing radiation therapy for prostate cancer, memory deficits     PT Frequency  2x / week    PT Duration  8 weeks    PT Treatment/Interventions  ADLs/Self Care Home Management;Aquatic Therapy;Cryotherapy;Moist Heat;Traction;Ultrasound;Therapeutic activities;Functional mobility training;Stair training;Gait training;DME Instruction;Therapeutic exercise;Balance training;Neuromuscular re-education;Patient/family education;Manual techniques;Passive range of motion;Energy conservation;Vestibular;Taping    PT Next Visit Plan  progress dynamic stability    PT Home Exercise Plan  see above    Consulted and Agree with Plan of Care  Patient       Patient will benefit from skilled therapeutic intervention in order to improve the following deficits and impairments:  Abnormal gait, Cardiopulmonary status limiting activity, Decreased activity tolerance, Decreased balance, Decreased knowledge of precautions, Decreased endurance, Decreased coordination, Decreased cognition, Decreased knowledge of use of DME, Decreased mobility, Difficulty walking, Decreased safety awareness, Decreased strength, Impaired flexibility, Impaired perceived functional ability, Impaired sensation, Postural dysfunction, Improper body mechanics, Pain  Visit Diagnosis: Unsteadiness on feet  Muscle weakness (generalized)  Other abnormalities of gait and mobility  Difficulty in walking, not elsewhere classified     Problem List Patient Active Problem List   Diagnosis Date Noted  . Status post abdominal aortic aneurysm (AAA) repair 04/15/2019  . Candidiasis 02/22/2019  . Malignant neoplasm of descending colon ([place])   . Benign neoplasm of descending colon   . Benign neoplasm of  transverse colon   . Do not intubate, cardiopulmonary resuscitation (CPR)-only code status 10/08/2018  . Prostate cancer ([place]) 02/25/2018  . Prostatic cyst 01/15/2018  . 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 ([place]) 09/15/2017  . Blood in stool   . Abnormal feces   . Stricture and stenosis of esophagus   . Mild cognitive impairment 08/18/2017  . Senile purpura ([place]) 04/29/2017  . Anemia 04/29/2017  . Frequent falls 04/29/2017  . Acute respiratory failure ([place]) 12/17/2016  . Simple chronic bronchitis ([place]) 12/17/2016  . Benign localized hyperplasia of prostate with urinary obstruction 07/15/2016  . Elevated PSA 07/15/2016  . Incomplete emptying of bladder 07/15/2016  . Urge incontinence 07/15/2016  . Hypothyroidism 04/25/2016  . Macular degeneration 04/25/2016  . Erectile dysfunction due to arterial insufficiency   . Ischemic heart disease  with chronotropic incompetence   . CN (constipation) 02/10/2015  . DD (diverticular disease) 02/10/2015  . Fatigue 02/10/2015  . Lichen planus 74/45/1460  . Restless leg 02/10/2015  . 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 B12 deficiency 02/10/2015  . COPD (chronic obstructive pulmonary disease) ([place]) 11/14/2014  . Post Inflammatory Lung Changes 11/14/2014  . PAF (paroxysmal atrial fibrillation) ([facility]) CHA2DS2-VASc = 4. AC = Eliquis   . Insomnia 12/09/2013  . OSA on CPAP   . Dyspnea on exertion - -essentially resolved with BB dose reduction & wgt loss 03/29/2013  . Overweight (BMI 25.0-29.9) -- Wgt back up 01/17/2013  . Atherosclerotic heart disease of native coronary artery without angina pectoris - s/p CABG, occluded SVG-D1; patent LIMA-LAD, SVG-OM, SVG- RCA   . Essential hypertension   . Hyperlipidemia with target LDL less than 70   . Aorto-iliac disease ([place])   . BCC (basal cell carcinoma), eyelid 01/03/2013  . Allergic rhinitis 08/17/2009  . Adaptation reaction 05/28/2009  . Acid reflux 05/28/2009  . Arthritis, degenerative 05/28/2009  .  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 Carotid artery occlusion without infarction 06/01/2006  . PAD (peripheral artery disease) - bilateral common iliac stents 12/15/2005  . Aortoiliac occlusive disease ([place]) 11/30/2005  . Atherosclerotic heart disease of artery bypass graft 09/01/1997   Janna Arch, PT, DPT   10/11/2019, 12:00 PM  [place] MAIN [facility] SERVICES [address] [place], [place], [address] Phone: [phone]   Fax:  [phone]  Name: ALPHONS BURGERT MRN: [id number] Date of Birth: September 19, 1933

## 2019-10-14 ENCOUNTER — Other Ambulatory Visit: Payer: Self-pay

## 2019-10-14 ENCOUNTER — Ambulatory Visit: Payer: Medicare Other

## 2019-10-14 DIAGNOSIS — R2681 Unsteadiness on feet: Secondary | ICD-10-CM | POA: Diagnosis not present

## 2019-10-14 DIAGNOSIS — M6281 Muscle weakness (generalized): Secondary | ICD-10-CM

## 2019-10-14 DIAGNOSIS — R2689 Other abnormalities of gait and mobility: Secondary | ICD-10-CM

## 2019-10-14 NOTE — Therapy (Signed)
[place] MAIN [facility] SERVICES [address] [place], [place], [address] Phone: [phone]   Fax:  [phone]  Physical Therapy Treatment  Patient Details  Name: [name] MRN: [id number] Date of Birth: [birth year] Referring Provider (PT): Eulogio Ditch   Encounter Date: 10/14/2019  PT End of Session - 10/14/19 1052    Visit Number  32    Number of Visits  45    Date for PT Re-Evaluation  11/28/19    Authorization Type  2/10 PN start 2/5    PT Start Time  1000    PT Stop Time  1043    PT Time Calculation (min)  43 min    Equipment Utilized During Treatment  Gait belt    Activity Tolerance  Patient tolerated treatment well    Behavior During Therapy  WFL for tasks assessed/performed       Past Medical History:  Diagnosis Date  . AAA (abdominal aortic aneurysm) ([place])   . Arthritis   . Atherosclerotic heart disease of native coronary artery without angina pectoris 1998   - s/p CABG, occluded SVG-D1; patent LIMA-LAD, SVG-OM, SVG- RCA  . Basal cell carcinoma of eyelid 01/03/2013   2019 - R side of Nose  . Benign neoplasm of ascending colon   . Benign neoplasm of descending colon   . Cancer [facility])    Skin cancer  . Colon polyps   . COPD (chronic obstructive pulmonary disease) ([place])   . Coronary artery disease   . Dementia ([place])   . Dysrhythmia    atrial fib  . Falls frequently   . GERD (gastroesophageal reflux disease)   . Hemorrhoid 02/10/2015  . History of hiatal hernia   . History of kidney stones   . HOH (hard of hearing)   . Hypertension   . Memory loss   . Osteoporosis   . PAF (paroxysmal atrial fibrillation) ([place])   . Prostate cancer ([place])   . Prostate disorder   . Sciatic pain    Chronic  . Sleep apnea    cpap  . Temporary cerebral vascular dysfunction 02/10/2015   Had negative work-up.  July, 2012.  No medication changes, had forgot them that day, got dehydrated.   . Urinary incontinence     Past Surgical  History:  Procedure Laterality Date  . ABDOMINAL AORTIC ENDOVASCULAR STENT GRAFT Bilateral 05/18/2019   Procedure: ABDOMINAL AORTIC ENDOVASCULAR STENT GRAFT;  Surgeon: Algernon Huxley, MD;  Location: [facility] ORS;  Service: Vascular;  Laterality: Bilateral;  . ANKLE SURGERY    . Arch aortogram and carotid aortogram  06/02/2006   Dr Oneida Alar did surgery  . BASAL CELL CARCINOMA EXCISION  04/2016   Dermatology  . CARDIAC CATHETERIZATION  01/30/1998    (Dr. Linard Millers): Native LAD & mRCA CTO. 90% D1. Mod Cx. SVG-D1 CTO. SVG-RCA & SVG-OM along with LIMA-LAD patent;  LV NORMAL Fxn  . CAROTID ENDARTERECTOMY Left Oct. 29, 2007   Dr. Oneida Alar:  . CATARACT EXTRACTION W/PHACO Right 03/05/2017   Procedure: CATARACT EXTRACTION PHACO AND INTRAOCULAR LENS PLACEMENT (IOC);  Surgeon: Leandrew Koyanagi, MD;  Location: [facility] ORS;  Service: Ophthalmology;  Laterality: Right;  [place] 00:58.5 AP% 10.6 CDE 6.20 Fluid Pack lot # [id number]  . CATARACT EXTRACTION W/PHACO Left 04/15/2017   Procedure: CATARACT EXTRACTION PHACO AND INTRAOCULAR LENS PLACEMENT ([place])  Left;  Surgeon: Leandrew Koyanagi, MD;  Location: [place];  Service: Ophthalmology;  Laterality: Left;  . COLONOSCOPY WITH PROPOFOL N/A 09/15/2017  Procedure: COLONOSCOPY WITH PROPOFOL;  Surgeon: Lucilla Lame, MD;  Location: [facility] ENDOSCOPY;  Service: Endoscopy;  Laterality: N/A;  . COLONOSCOPY WITH PROPOFOL N/A 10/26/2018   Procedure: COLONOSCOPY WITH PROPOFOL;  Surgeon: Lucilla Lame, MD;  Location: [facility] ENDOSCOPY;  Service: Endoscopy;  Laterality: N/A;  . CORONARY ARTERY BYPASS GRAFT  1998   LIMA-LAD, SVG-OM, SVG-RPDA, SVG-DIAG  . CPET / MET  12/2012   Mild chronotropic incompetence - read 82% of predicted; also reduced effort; peak VO2 15.7 / 75% (did not reach Max effort) -- suggested ischemic response in last 1.5 minutes of exercise. Normal pulmonary function on PFTs but poor response to  . DOPPLER ECHOCARDIOGRAPHY  11/20/2010   EF =>55%; LV norm mild aortic  scelorosis  . ENDOVASCULAR REPAIR/STENT GRAFT N/A 05/18/2019   Procedure: ENDOVASCULAR REPAIR/STENT GRAFT;  Surgeon: Algernon Huxley, MD;  Location: [facility] CV LAB;  Service: Cardiovascular;  Laterality: N/A;  . ESOPHAGOGASTRODUODENOSCOPY (EGD) WITH PROPOFOL N/A 09/15/2017   Procedure: ESOPHAGOGASTRODUODENOSCOPY (EGD) WITH PROPOFOL;  Surgeon: Lucilla Lame, MD;  Location: [facility] ENDOSCOPY;  Service: Endoscopy;  Laterality: N/A;  . FRACTURE SURGERY Right 03/19/2015   wrist  . HIATAL HERNIA REPAIR    . ILIAC ARTERY STENT  12/15/2005   PTA and direct stenting rgt and lft common iliac arteries  . KYPHOPLASTY N/A 04/05/2019   Procedure: KYPHOPLASTY T12, L3, L4 L5;  Surgeon: Hessie Knows, MD;  Location: [facility] ORS;  Service: Orthopedics;  Laterality: N/A;  . NM GATED MYOVIEW ([place] [facility])  12/2017   Low Risk - no ischemia or infarct.  EF > 65%. no RWMA  . NM MYOVIEW ([place] HX)  11/11/2010; February 2016   a) TM STRESS----NORMAL PERFUSION ,EF 68%; b) EF 50-55%. Normal LV function. No significant ischemia or infarction.  . OPEN REDUCTION INTERNAL FIXATION (ORIF) DISTAL RADIAL FRACTURE Left 01/13/2019   Procedure: OPEN REDUCTION INTERNAL FIXATION (ORIF) DISTAL  RADIAL FRACTURE - LEFT - SLEEP APNEA;  Surgeon: Hessie Knows, MD;  Location: [facility] ORS;  Service: Orthopedics;  Laterality: Left;  . ORIF ELBOW FRACTURE Right 01/01/2017   Procedure: OPEN REDUCTION INTERNAL FIXATION (ORIF) ELBOW/OLECRANON FRACTURE;  Surgeon: Hessie Knows, MD;  Location: [facility] ORS;  Service: Orthopedics;  Laterality: Right;  . ORIF WRIST FRACTURE Right 03/19/2015   Procedure: OPEN REDUCTION INTERNAL FIXATION (ORIF) WRIST FRACTURE;  Surgeon: Hessie Knows, MD;  Location: [facility] ORS;  Service: Orthopedics;  Laterality: Right;  . THORACIC AORTA - CAROTID ANGIOGRAM  October 2007   Dr. 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 TRANSTHORACIC ECHOCARDIOGRAM  February  2016   [facility]: Normal LV function. Dilated left atrium.    There were no vitals filed for this visit.  Subjective Assessment - 10/14/19 1048    Subjective  Patient reports LOB last night stepping off curb, but he was able to catch himself and someone was beside him to help. He is fatigued today, but slept well. Foot is bothering him.    Pertinent History  Patient referred to physical therapy for balance/strength issues s/p abdominal aortic aneurysm (AAA) without rupture repair on 05/18/19 .  Patient has been seen by this therapist in the past (last seen in march of 2020) Patient had a fall on 01/08/19, MRI from [facility] dated 03/28/19 report acute compression fractures at T12, L3 and L4 as well as facet arthropathy at the lower lumbar levels and at L3-4 moderate central stenosis. Patient underwent injection on 10/6 at L4-5 and L5-S1.  PMH includes AAA, arthritis, atherosclerotic heart disease, (s/p CABG), benign neoplasm of ascending/descending colon, COPD, CAD, dementia, dysrhythmia, frequent falls, GERD, hemorrhoid, HOH, HTN, PAF, prostate cancer. Monday through Friday have 3 people come in different times from 730 to 12pm. Uses a walker occasionally now    Limitations  Walking;Standing;House hold activities;Other (comment);Lifting    How long can you sit comfortably?  20 minutes before back is painful    How long can you stand comfortably?  hour    How long can you walk comfortably?  using a walker about 30 minutes    Patient Stated Goals  patient would like to improve his balance and strenght.    Currently in Pain?  Other (Comment)   Pt described it as bothering him, not pain   Pain Location  Foot    Pain Orientation  Right    Pain Descriptors / Indicators  Aching    Pain Type  Chronic pain    Pain Onset  More than a month ago    Pain Frequency  Constant    Aggravating Factors   weightbearing    Pain Relieving Factors  nonweightbearing    Multiple Pain Sites  No    Pain Onset  More than a month ago       Nustep Lvl 3 seat 12 RPM> 60 ; 4 minutes for cardiovascular challenge and warm up prior to exercise  CGA, seated rest breaks between exercises/sets: Seated marches with PT dictating change in pace (normal, quick, slow), 30 sec, for LE and abdominal control, cueing for upright posture  Single leg airex balance with taps front, side, and back returning to center each time, SUE support on treadmill, 5 rounds each LE, for stability and coordination  Standing balloon taps next to treadmill support, 2x 30 sec, involving reaching to vary COM during balance  STS with rainbow ball adduction, airex on chair to increase height, 2x5, SUE required to stand and as guide to sit down  LAQ, maintaining knee extension on one LE while extending opposite knee, and then back to neutral one LE at a time, 10x total, PT noted reliance on back support and BUE support on chair arms  RTB hamstring curl with PF in knee flexion, 10x each LE, coordination challenging for pt  Seated dynadisc circles clockwise and counterclockwise, 10x each direction on each LE, PT did not correct for knee motion because would deter coordination and ankle was still working  Seated soccer ball kicks, 30 sec, for coordination and muscle control, core activation for upright posture     Patient presented to PT with fatigue but good motivation. Foot pain limited standing exercises able to be performed, and pt required seated rest breaks for oxygen monitoring. This session, he required less cueing/task orientation than normal with coordination exercises. He did well with stability, yet was challenged with strength today, as noted during STS and LAQs. Patient would benefit from further skilled PT intervention to increase strength and stability for safer performance of ADLs and increased functional ability.                  PT Education - 10/14/19 1007    Education provided  Yes    Education Details  exercise technique, body  mechanics    Person(s) Educated  Patient    Methods  Explanation;Demonstration;Tactile cues;Verbal cues    Comprehension  Verbalized understanding;Returned demonstration;Verbal cues required;Tactile cues required       PT Short Term Goals - 10/03/19 1312  PT SHORT TERM GOAL #1   Title  Patient will be independent in home exercise program to improve strength/mobility for better functional independence with ADLs.    Baseline  10/16: HEP given 11/17: HEP compliant    Time  4    Period  Weeks    Status  Achieved    Target Date  07/15/19      PT SHORT TERM GOAL #2   Title  Patient will deny any falls over past 4 weeks to demonstrate improved safety awareness at home and work.    Baseline  10/16: one major fall 11/17: no falls    Time  4    Period  Weeks    Status  Achieved    Target Date  07/15/19        PT Long Term Goals - 10/03/19 1423      PT LONG TERM GOAL #1   Title  Patient will increase BLE gross strength to 4+/5 as to improve functional strength for independent gait, increased standing tolerance and increased ADL ability.    Baseline  10/16: R gross 4-/5 with add/ext 3+/5 L grossly 3+/5 with hip add/ext 2+/5 11/17: R 4/5 add/ext; -/5 L 4-/5 hip add/ext 3/5 12/8: R: 4/5 L 4-/5 hip add/ext 3+/5 2/1: R 4+/5 L 4/5 with hip ext 4-/5    Time  8    Period  Weeks    Status  Partially Met    Target Date  11/28/19      PT LONG TERM GOAL #2   Title  Patient (> 18 years old) will complete five times sit to stand test in < 15 seconds indicating an increased LE strength and improved balance.    Baseline  10/16: 17 seconds with heavy use of hands on knees 11/17: 16 seconds one near LOB 12/8: 13 seconds one posterior LOB    Time  8    Period  Weeks    Status  Achieved      PT LONG TERM GOAL #3   Title  Patient will increase Berg Balance score by > 6 points (43/56)  to demonstrate decreased fall risk during functional activities.    Baseline  10/16: 37/56 11/17: 42/56 12/8:  47/56    Time  8    Period  Weeks    Status  Achieved      PT LONG TERM GOAL #4   Title  Patient will increase 10 meter walk test to >1.38ms as to improve gait speed for better community ambulation and to reduce fall risk.    Baseline  10/16: 0.91 w/o AD 11/17: 1.43 m/s with no AD    Time  8    Period  Weeks    Status  Achieved      PT LONG TERM GOAL #5   Title  Patient will increase ABC scale score >80% to demonstrate better functional mobility and better confidence with ADLs.    Baseline  10/16; 71% 11/17: 80% 12/8: 83%    Time  8    Period  Weeks    Status  Achieved      PT LONG TERM GOAL #6   Title  Patient will improve score on DGI to > or = 19/24 on the DGI to increase dynamic balance during mobility and to decrease risk of falls.    Baseline  12/8: perform next session 12/29: 18/24 2/1: 18/24    Time  8    Period  Weeks  Status  On-going    Target Date  11/28/19      PT LONG TERM GOAL #7   Title  Patient will increase Berg Balance score by > 6 points (53/56)  to demonstrate decreased fall risk during functional activities.    Baseline  12/8; 47/56 12/29: 49/56 2/1: 46/56    Time  8    Period  Weeks    Status  On-going    Target Date  11/28/19            Plan - 10/14/19 1114    Clinical Impression Statement  Patient presented to PT with fatigue but good motivation. Foot pain limited standing exercises able to be performed, and pt required seated rest breaks for oxygen monitoring. This session, he required less cueing/task orientation than normal with coordination exercises. He did well with stability, yet was challenged with strength today, as noted during STS and LAQs. Patient would benefit from further skilled PT intervention to increase strength and stability for safer performance of ADLs and increased functional ability.    Personal Factors and Comorbidities  Age;Comorbidity 3+;Fitness;Past/Current Experience;Sex;Social Background;Time since onset of  injury/illness/exacerbation;Transportation    Comorbidities  AAA, arthritis, atherosclerotic heart disease, (s/p CABG), benign neoplasm of ascending/descending colon, COPD, CAD, dementia, dysrhythmia, frequent falls, GERD, hemorrhoid, HOH, HTN, PAF, prostate cancer.    Examination-Activity Limitations  Bathing;Bend;Carry;Dressing;Continence;Stairs;Squat;Reach Overhead;Locomotion Level;Lift;Stand;Transfers;Toileting    Examination-Participation Restrictions  Church;Cleaning;Community Activity;Driving;Interpersonal Relationship;Laundry;Volunteer;Shop;Personal Finances;Meal Prep;Yard Work    [employer] Potential  Fair    Clinical Impairments Affecting Rehab Potential  (+) good support system, high prior level of function (-) currently undergoing radiation therapy for prostate cancer, memory deficits     PT Frequency  2x / week    PT Duration  8 weeks    PT Treatment/Interventions  ADLs/Self Care Home Management;Aquatic Therapy;Cryotherapy;Moist Heat;Traction;Ultrasound;Therapeutic activities;Functional mobility training;Stair training;Gait training;DME Instruction;Therapeutic exercise;Balance training;Neuromuscular re-education;Patient/family education;Manual techniques;Passive range of motion;Energy conservation;Vestibular;Taping    PT Next Visit Plan  progress dynamic stability    PT Home Exercise Plan  see above    Consulted and Agree with Plan of Care  Patient       Patient will benefit from skilled therapeutic intervention in order to improve the following deficits and impairments:  Abnormal gait, Cardiopulmonary status limiting activity, Decreased activity tolerance, Decreased balance, Decreased knowledge of precautions, Decreased endurance, Decreased coordination, Decreased cognition, Decreased knowledge of use of DME, Decreased mobility, Difficulty walking, Decreased safety awareness, Decreased strength, Impaired flexibility, Impaired perceived functional ability, Impaired sensation, Postural  dysfunction, Improper body mechanics, Pain  Visit Diagnosis: Unsteadiness on feet  Muscle weakness (generalized)  Other abnormalities of gait and mobility  Difficulty in walking, not elsewhere classified     Problem List Patient Active Problem List   Diagnosis Date Noted  . Status post abdominal aortic aneurysm (AAA) repair 04/15/2019  . Candidiasis 02/22/2019  . Malignant neoplasm of descending colon ([place])   . Benign neoplasm of descending colon   . Benign neoplasm of transverse colon   . Do not intubate, cardiopulmonary resuscitation (CPR)-only code status 10/08/2018  . Prostate cancer ([place]) 02/25/2018  . Prostatic cyst 01/15/2018  . 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 ([place]) 09/15/2017  . Blood in stool   . Abnormal feces   . Stricture and stenosis of esophagus   . Mild cognitive impairment 08/18/2017  . Senile purpura ([place]) 04/29/2017  . Anemia 04/29/2017  . Frequent falls 04/29/2017  . Acute respiratory failure ([place]) 12/17/2016  . Simple chronic bronchitis ([place]) 12/17/2016  . Benign  localized hyperplasia of prostate with urinary obstruction 07/15/2016  . Elevated PSA 07/15/2016  . Incomplete emptying of bladder 07/15/2016  . Urge incontinence 07/15/2016  . Hypothyroidism 04/25/2016  . Macular degeneration 04/25/2016  . Erectile dysfunction due to arterial insufficiency   . Ischemic heart disease with chronotropic incompetence   . CN (constipation) 02/10/2015  . DD (diverticular disease) 02/10/2015  . Fatigue 02/10/2015  . Lichen planus 00/29/8473  . Restless leg 02/10/2015  . 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 B12 deficiency 02/10/2015  . COPD (chronic obstructive pulmonary disease) ([place]) 11/14/2014  . Post Inflammatory Lung Changes 11/14/2014  . PAF (paroxysmal atrial fibrillation) ([facility]) CHA2DS2-VASc = 4. AC = Eliquis   . Insomnia 12/09/2013  . OSA on CPAP   . Dyspnea on exertion - -essentially resolved with BB dose reduction & wgt loss 03/29/2013  .  Overweight (BMI 25.0-29.9) -- Wgt back up 01/17/2013  . Atherosclerotic heart disease of native coronary artery without angina pectoris - s/p CABG, occluded SVG-D1; patent LIMA-LAD, SVG-OM, SVG- RCA   . Essential hypertension   . Hyperlipidemia with target LDL less than 70   . Aorto-iliac disease ([place])   . BCC (basal cell carcinoma), eyelid 01/03/2013  . Allergic rhinitis 08/17/2009  . Adaptation reaction 05/28/2009  . Acid reflux 05/28/2009  . Arthritis, degenerative 05/28/2009  . 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 Carotid artery occlusion without infarction 06/01/2006  . PAD (peripheral artery disease) - bilateral common iliac stents 12/15/2005  . Aortoiliac occlusive disease ([place]) 11/30/2005  . Atherosclerotic heart disease of artery bypass graft 09/01/1997    Florinda Marker, SPT This entire session was performed under direct supervision and direction of a licensed therapist/therapist assistant . I have personally read, edited and approve of the note as written.  Janna Arch, PT, DPT   10/14/2019, 11:16 AM  [place] MAIN [facility] SERVICES [address] [place], [place], [address] Phone: [phone]   Fax:  [phone]  Name: [name] MRN: [id number] Date of Birth: Sep 16, 1933

## 2019-10-18 ENCOUNTER — Ambulatory Visit: Payer: Medicare Other

## 2019-10-18 ENCOUNTER — Other Ambulatory Visit: Payer: Self-pay

## 2019-10-18 DIAGNOSIS — R2681 Unsteadiness on feet: Secondary | ICD-10-CM | POA: Diagnosis not present

## 2019-10-18 DIAGNOSIS — M6281 Muscle weakness (generalized): Secondary | ICD-10-CM

## 2019-10-18 DIAGNOSIS — R262 Difficulty in walking, not elsewhere classified: Secondary | ICD-10-CM

## 2019-10-18 DIAGNOSIS — R2689 Other abnormalities of gait and mobility: Secondary | ICD-10-CM

## 2019-10-18 NOTE — Therapy (Signed)
[place] MAIN [facility] SERVICES [address] [place], [place], [address] Phone: [phone]   Fax:  [phone]  Physical Therapy Treatment  Patient Details  Name: [name] MRN: [id number] Date of Birth: June 24, 1934 Referring Provider (PT): Eulogio Ditch   Encounter Date: 10/18/2019  PT End of Session - 10/18/19 1433    Visit Number  33    Number of Visits  45    Date for PT Re-Evaluation  11/28/19    Authorization Type  3/10 PN start 2/5    PT Start Time  1259    PT Stop Time  1339    PT Time Calculation (min)  40 min    Equipment Utilized During Treatment  Gait belt    Activity Tolerance  Patient tolerated treatment well    Behavior During Therapy  [facility] for tasks assessed/performed       Past Medical History:  Diagnosis Date  . AAA (abdominal aortic aneurysm) ([place])   . Arthritis   . Atherosclerotic heart disease of native coronary artery without angina pectoris 1998   - s/p CABG, occluded SVG-D1; patent LIMA-LAD, SVG-OM, SVG- RCA  . Basal cell carcinoma of eyelid 01/03/2013   2019 - R side of Nose  . Benign neoplasm of ascending colon   . Benign neoplasm of descending colon   . Cancer [facility])    Skin cancer  . Colon polyps   . COPD (chronic obstructive pulmonary disease) ([place])   . Coronary artery disease   . Dementia ([place])   . Dysrhythmia    atrial fib  . Falls frequently   . GERD (gastroesophageal reflux disease)   . Hemorrhoid 02/10/2015  . History of hiatal hernia   . History of kidney stones   . HOH (hard of hearing)   . Hypertension   . Memory loss   . Osteoporosis   . PAF (paroxysmal atrial fibrillation) ([place])   . Prostate cancer ([place])   . Prostate disorder   . Sciatic pain    Chronic  . Sleep apnea    cpap  . Temporary cerebral vascular dysfunction 02/10/2015   Had negative work-up.  July, 2012.  No medication changes, had forgot them that day, got dehydrated.   . Urinary incontinence     Past Surgical  History:  Procedure Laterality Date  . ABDOMINAL AORTIC ENDOVASCULAR STENT GRAFT Bilateral 05/18/2019   Procedure: ABDOMINAL AORTIC ENDOVASCULAR STENT GRAFT;  Surgeon: Algernon Huxley, MD;  Location: [facility] ORS;  Service: Vascular;  Laterality: Bilateral;  . ANKLE SURGERY    . Arch aortogram and carotid aortogram  06/02/2006   Dr Oneida Alar did surgery  . BASAL CELL CARCINOMA EXCISION  04/2016   Dermatology  . CARDIAC CATHETERIZATION  01/30/1998    (Dr. Linard Millers): Native LAD & mRCA CTO. 90% D1. Mod Cx. SVG-D1 CTO. SVG-RCA & SVG-OM along with LIMA-LAD patent;  LV NORMAL Fxn  . CAROTID ENDARTERECTOMY Left Oct. 29, 2007   Dr. Oneida Alar:  . CATARACT EXTRACTION W/PHACO Right 03/05/2017   Procedure: CATARACT EXTRACTION PHACO AND INTRAOCULAR LENS PLACEMENT (IOC);  Surgeon: William Koyanagi, MD;  Location: [facility] ORS;  Service: Ophthalmology;  Laterality: Right;  [place] 00:58.5 AP% 10.6 CDE 6.20 Fluid Pack lot # [id number]  . CATARACT EXTRACTION W/PHACO Left 04/15/2017   Procedure: CATARACT EXTRACTION PHACO AND INTRAOCULAR LENS PLACEMENT ([place])  Left;  Surgeon: William Koyanagi, MD;  Location: [place];  Service: Ophthalmology;  Laterality: Left;  . COLONOSCOPY WITH PROPOFOL N/A 09/15/2017  Procedure: COLONOSCOPY WITH PROPOFOL;  Surgeon: Lucilla Lame, MD;  Location: [facility] ENDOSCOPY;  Service: Endoscopy;  Laterality: N/A;  . COLONOSCOPY WITH PROPOFOL N/A 10/26/2018   Procedure: COLONOSCOPY WITH PROPOFOL;  Surgeon: Lucilla Lame, MD;  Location: [facility] ENDOSCOPY;  Service: Endoscopy;  Laterality: N/A;  . CORONARY ARTERY BYPASS GRAFT  1998   LIMA-LAD, SVG-OM, SVG-RPDA, SVG-DIAG  . CPET / MET  12/2012   Mild chronotropic incompetence - read 82% of predicted; also reduced effort; peak VO2 15.7 / 75% (did not reach Max effort) -- suggested ischemic response in last 1.5 minutes of exercise. Normal pulmonary function on PFTs but poor response to  . DOPPLER ECHOCARDIOGRAPHY  11/20/2010   EF =>55%; LV norm mild aortic  scelorosis  . ENDOVASCULAR REPAIR/STENT GRAFT N/A 05/18/2019   Procedure: ENDOVASCULAR REPAIR/STENT GRAFT;  Surgeon: Algernon Huxley, MD;  Location: [place] CV LAB;  Service: Cardiovascular;  Laterality: N/A;  . ESOPHAGOGASTRODUODENOSCOPY (EGD) WITH PROPOFOL N/A 09/15/2017   Procedure: ESOPHAGOGASTRODUODENOSCOPY (EGD) WITH PROPOFOL;  Surgeon: Lucilla Lame, MD;  Location: [facility] ENDOSCOPY;  Service: Endoscopy;  Laterality: N/A;  . FRACTURE SURGERY Right 03/19/2015   wrist  . HIATAL HERNIA REPAIR    . ILIAC ARTERY STENT  12/15/2005   PTA and direct stenting rgt and lft common iliac arteries  . KYPHOPLASTY N/A 04/05/2019   Procedure: KYPHOPLASTY T12, L3, L4 L5;  Surgeon: Hessie Knows, MD;  Location: [facility] ORS;  Service: Orthopedics;  Laterality: N/A;  . NM GATED MYOVIEW ([place] HX)  12/2017   Low Risk - no ischemia or infarct.  EF > 65%. no RWMA  . NM MYOVIEW ([place] HX)  11/11/2010; February 2016   a) TM STRESS----NORMAL PERFUSION ,EF 68%; b) EF 50-55%. Normal LV function. No significant ischemia or infarction.  . OPEN REDUCTION INTERNAL FIXATION (ORIF) DISTAL RADIAL FRACTURE Left 01/13/2019   Procedure: OPEN REDUCTION INTERNAL FIXATION (ORIF) DISTAL  RADIAL FRACTURE - LEFT - SLEEP APNEA;  Surgeon: Hessie Knows, MD;  Location: [facility] ORS;  Service: Orthopedics;  Laterality: Left;  . ORIF ELBOW FRACTURE Right 01/01/2017   Procedure: OPEN REDUCTION INTERNAL FIXATION (ORIF) ELBOW/OLECRANON FRACTURE;  Surgeon: Hessie Knows, MD;  Location: [facility] ORS;  Service: Orthopedics;  Laterality: Right;  . ORIF WRIST FRACTURE Right 03/19/2015   Procedure: OPEN REDUCTION INTERNAL FIXATION (ORIF) WRIST FRACTURE;  Surgeon: Hessie Knows, MD;  Location: [facility] ORS;  Service: Orthopedics;  Laterality: Right;  . THORACIC AORTA - CAROTID ANGIOGRAM  October 2007   Dr. 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 TRANSTHORACIC ECHOCARDIOGRAM  February  2016   [facility]: Normal LV function. Dilated left atrium.    There were no vitals filed for this visit.  Subjective Assessment - 10/18/19 1304    Subjective  Patient reports no LOB/falls since last session. Foot pain at 5/10.    Pertinent History  Patient referred to physical therapy for balance/strength issues s/p abdominal aortic aneurysm (AAA) without rupture repair on 05/18/19 .  Patient has been seen by this therapist in the past (last seen in march of 2020) Patient had a fall on 01/08/19, MRI from [facility] dated 03/28/19 report acute compression fractures at T12, L3 and L4 as well as facet arthropathy at the lower lumbar levels and at L3-4 moderate central stenosis. Patient underwent injection on 10/6 at L4-5 and L5-S1. PMH includes AAA, arthritis, atherosclerotic heart disease, (s/p CABG), benign neoplasm of ascending/descending colon, COPD, CAD, dementia, dysrhythmia, frequent falls, GERD, hemorrhoid,  HOH, HTN, PAF, prostate cancer. Monday through Friday have 3 people come in different times from 730 to 12pm. Uses a walker occasionally now    Limitations  Walking;Standing;House hold activities;Other (comment);Lifting    How long can you sit comfortably?  20 minutes before back is painful    How long can you stand comfortably?  hour    How long can you walk comfortably?  using a walker about 30 minutes    Patient Stated Goals  patient would like to improve his balance and strenght.    Currently in Pain?  Yes    Pain Score  5     Pain Location  Foot    Pain Orientation  Right    Pain Descriptors / Indicators  Aching    Pain Type  Chronic pain    Pain Onset  More than a month ago    Pain Onset  More than a month ago     Seated rest breaks required throughout session due to labored breathing and low SpO2. PT encouraged deep breathing. Range from 90-99%. Therapeutic Exercise: Nustep Lvl 3, seat 12, RPM > 60 ; 4 minutes for cardiovascular challenge and warm up prior to exercise STS with rainbow ball  presses alternating out and up, 5x each for 10x total  Seated powered knee to PT hand target for explosive stability and muscle activation coordination, 10x each LE  // bars, CGA: Standing Saebo ball transfer, each UE, no UE support ; ~ 3 minutes Seated cross body punches to boxing mitts, 10x each UE, for strength and core activation Side kicks returning to neutral stance (march, kick out, return to march, lower), BUE support, 10x each LE, cueing for upright posture Airex balance single leg, BUE support, 2x 20 sec each LE Standing balloon tap, no UE support, 60 sec, for dynamic stability reaching inside/outside BOS without LOB  Seated perturbations to rainbow ball held at 90 degrees shoulder flexion, 3x 10 sec, cueing for decreased reliance on back support for core activation  Seated soccer ball roll out/in, 10x each LE    Pt educated throughout session about proper posture and technique with exercises. Improved exercise technique, movement at target joints, use of target muscles after min to mod verbal, visual, tactile cues.  Patient reported to PT with good motivation. Due to foot pain, exercises involving ambulation or standing for an extended period were avoided. Seated rest breaks were required due to PT noting low SpO2 (90%). This session, he performed well with stability exercises without UE support or with BUE support on unstable surface. He also did well with coordination of moderate complexity exercises with Min-Mod cueing. He continues to be challenged by strength and core stability. Patient would benefit from further skilled PT intervention to increase strength and stability for safer performance of ADLs and increased functional ability.                 PT Education - 10/18/19 1257    Education provided  Yes    Education Details  exercise technique, body mechanics    Person(s) Educated  Patient    Methods  Explanation;Demonstration;Tactile cues;Verbal cues     Comprehension  Verbalized understanding;Returned demonstration;Verbal cues required;Tactile cues required       PT Short Term Goals - 10/03/19 1312      PT SHORT TERM GOAL #1   Title  Patient will be independent in home exercise program to improve strength/mobility for better functional independence with ADLs.    Baseline  10/16: HEP given 11/17: HEP compliant    Time  4    Period  Weeks    Status  Achieved    Target Date  07/15/19      PT SHORT TERM GOAL #2   Title  Patient will deny any falls over past 4 weeks to demonstrate improved safety awareness at home and work.    Baseline  10/16: one major fall 11/17: no falls    Time  4    Period  Weeks    Status  Achieved    Target Date  07/15/19        PT Long Term Goals - 10/03/19 1423      PT LONG TERM GOAL #1   Title  Patient will increase BLE gross strength to 4+/5 as to improve functional strength for independent gait, increased standing tolerance and increased ADL ability.    Baseline  10/16: R gross 4-/5 with add/ext 3+/5 L grossly 3+/5 with hip add/ext 2+/5 11/17: R 4/5 add/ext; -/5 L 4-/5 hip add/ext 3/5 12/8: R: 4/5 L 4-/5 hip add/ext 3+/5 2/1: R 4+/5 L 4/5 with hip ext 4-/5    Time  8    Period  Weeks    Status  Partially Met    Target Date  11/28/19      PT LONG TERM GOAL #2   Title  Patient (> 16 years old) will complete five times sit to stand test in < 15 seconds indicating an increased LE strength and improved balance.    Baseline  10/16: 17 seconds with heavy use of hands on knees 11/17: 16 seconds one near LOB 12/8: 13 seconds one posterior LOB    Time  8    Period  Weeks    Status  Achieved      PT LONG TERM GOAL #3   Title  Patient will increase Berg Balance score by > 6 points (43/56)  to demonstrate decreased fall risk during functional activities.    Baseline  10/16: 37/56 11/17: 42/56 12/8: 47/56    Time  8    Period  Weeks    Status  Achieved      PT LONG TERM GOAL #4   Title  Patient will  increase 10 meter walk test to >1.44ms as to improve gait speed for better community ambulation and to reduce fall risk.    Baseline  10/16: 0.91 w/o AD 11/17: 1.43 m/s with no AD    Time  8    Period  Weeks    Status  Achieved      PT LONG TERM GOAL #5   Title  Patient will increase ABC scale score >80% to demonstrate better functional mobility and better confidence with ADLs.    Baseline  10/16; 71% 11/17: 80% 12/8: 83%    Time  8    Period  Weeks    Status  Achieved      PT LONG TERM GOAL #6   Title  Patient will improve score on DGI to > or = 19/24 on the DGI to increase dynamic balance during mobility and to decrease risk of falls.    Baseline  12/8: perform next session 12/29: 18/24 2/1: 18/24    Time  8    Period  Weeks    Status  On-going    Target Date  11/28/19      PT LONG TERM GOAL #7   Title  Patient will increase Berg Balance score by >  6 points (53/56)  to demonstrate decreased fall risk during functional activities.    Baseline  12/8; 47/56 12/29: 49/56 2/1: 46/56    Time  8    Period  Weeks    Status  On-going    Target Date  11/28/19            Plan - 10/18/19 1445    Clinical Impression Statement  Patient reported to PT with good motivation. Due to foot pain, exercises involving ambulation or standing for an extended period were avoided. Seated rest breaks were required due to PT noting low SpO2 (90%). This session, he performed well with stability exercises without UE support or with BUE support on unstable surface. He also did well with coordination of moderate complexity exercises with Min-Mod cueing. He continues to be challenged by strength and core stability. Patient would benefit from further skilled PT intervention to increase strength and stability for safer performance of ADLs and increased functional ability.    Personal Factors and Comorbidities  Age;Comorbidity 3+;Fitness;Past/Current Experience;Sex;Social Background;Time since onset of  injury/illness/exacerbation;Transportation    Comorbidities  AAA, arthritis, atherosclerotic heart disease, (s/p CABG), benign neoplasm of ascending/descending colon, COPD, CAD, dementia, dysrhythmia, frequent falls, GERD, hemorrhoid, HOH, HTN, PAF, prostate cancer.    Examination-Activity Limitations  Bathing;Bend;Carry;Dressing;Continence;Stairs;Squat;Reach Overhead;Locomotion Level;Lift;Stand;Transfers;Toileting    Examination-Participation Restrictions  Church;Cleaning;Community Activity;Driving;Interpersonal Relationship;Laundry;Volunteer;Shop;Personal Finances;Meal Prep;Yard Work    [employer] Potential  Fair    Clinical Impairments Affecting Rehab Potential  (+) good support system, high prior level of function (-) currently undergoing radiation therapy for prostate cancer, memory deficits     PT Frequency  2x / week    PT Duration  8 weeks    PT Treatment/Interventions  ADLs/Self Care Home Management;Aquatic Therapy;Cryotherapy;Moist Heat;Traction;Ultrasound;Therapeutic activities;Functional mobility training;Stair training;Gait training;DME Instruction;Therapeutic exercise;Balance training;Neuromuscular re-education;Patient/family education;Manual techniques;Passive range of motion;Energy conservation;Vestibular;Taping    PT Next Visit Plan  progress dynamic stability    PT Home Exercise Plan  see above    Consulted and Agree with Plan of Care  Patient       Patient will benefit from skilled therapeutic intervention in order to improve the following deficits and impairments:  Abnormal gait, Cardiopulmonary status limiting activity, Decreased activity tolerance, Decreased balance, Decreased knowledge of precautions, Decreased endurance, Decreased coordination, Decreased cognition, Decreased knowledge of use of DME, Decreased mobility, Difficulty walking, Decreased safety awareness, Decreased strength, Impaired flexibility, Impaired perceived functional ability, Impaired sensation, Postural  dysfunction, Improper body mechanics, Pain  Visit Diagnosis: Unsteadiness on feet  Muscle weakness (generalized)  Other abnormalities of gait and mobility  Difficulty in walking, not elsewhere classified     Problem List Patient Active Problem List   Diagnosis Date Noted  . Status post abdominal aortic aneurysm (AAA) repair 04/15/2019  . Candidiasis 02/22/2019  . Malignant neoplasm of descending colon ([place])   . Benign neoplasm of descending colon   . Benign neoplasm of transverse colon   . Do not intubate, cardiopulmonary resuscitation (CPR)-only code status 10/08/2018  . Prostate cancer ([place]) 02/25/2018  . Prostatic cyst 01/15/2018  . 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 ([place]) 09/15/2017  . Blood in stool   . Abnormal feces   . Stricture and stenosis of esophagus   . Mild cognitive impairment 08/18/2017  . Senile purpura ([place]) 04/29/2017  . Anemia 04/29/2017  . Frequent falls 04/29/2017  . Acute respiratory failure ([place]) 12/17/2016  . Simple chronic bronchitis ([place]) 12/17/2016  . Benign localized hyperplasia of prostate with urinary obstruction 07/15/2016  . Elevated PSA 07/15/2016  .  Incomplete emptying of bladder 07/15/2016  . Urge incontinence 07/15/2016  . Hypothyroidism 04/25/2016  . Macular degeneration 04/25/2016  . Erectile dysfunction due to arterial insufficiency   . Ischemic heart disease with chronotropic incompetence   . CN (constipation) 02/10/2015  . DD (diverticular disease) 02/10/2015  . Fatigue 02/10/2015  . Lichen planus 49/35/5217  . Restless leg 02/10/2015  . 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 B12 deficiency 02/10/2015  . COPD (chronic obstructive pulmonary disease) ([place]) 11/14/2014  . Post Inflammatory Lung Changes 11/14/2014  . PAF (paroxysmal atrial fibrillation) ([facility]) CHA2DS2-VASc = 4. AC = Eliquis   . Insomnia 12/09/2013  . OSA on CPAP   . Dyspnea on exertion - -essentially resolved with BB dose reduction & wgt loss 03/29/2013  .  Overweight (BMI 25.0-29.9) -- Wgt back up 01/17/2013  . Atherosclerotic heart disease of native coronary artery without angina pectoris - s/p CABG, occluded SVG-D1; patent LIMA-LAD, SVG-OM, SVG- RCA   . Essential hypertension   . Hyperlipidemia with target LDL less than 70   . Aorto-iliac disease ([place])   . BCC (basal cell carcinoma), eyelid 01/03/2013  . Allergic rhinitis 08/17/2009  . Adaptation reaction 05/28/2009  . Acid reflux 05/28/2009  . Arthritis, degenerative 05/28/2009  . 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 Carotid artery occlusion without infarction 06/01/2006  . PAD (peripheral artery disease) - bilateral common iliac stents 12/15/2005  . Aortoiliac occlusive disease ([place]) 11/30/2005  . Atherosclerotic heart disease of artery bypass graft 09/01/1997    Florinda Marker, SPT   This entire session was performed under direct supervision and direction of a licensed therapist/therapist assistant . I have personally read, edited and approve of the note as written.  Janna Arch, PT, DPT   10/18/2019, 3:45 PM  [place] MAIN [facility] SERVICES [address] [place], [place], [address] Phone: [phone]   Fax:  [phone]  Name: [name] MRN: [id number] Date of Birth: 1934-06-27

## 2019-10-20 ENCOUNTER — Ambulatory Visit: Payer: Medicare Other

## 2019-10-22 ENCOUNTER — Other Ambulatory Visit: Payer: Self-pay | Admitting: Family Medicine

## 2019-10-22 DIAGNOSIS — I10 Essential (primary) hypertension: Secondary | ICD-10-CM

## 2019-10-24 ENCOUNTER — Other Ambulatory Visit: Payer: Self-pay | Admitting: Family Medicine

## 2019-10-24 DIAGNOSIS — I10 Essential (primary) hypertension: Secondary | ICD-10-CM

## 2019-10-25 ENCOUNTER — Ambulatory Visit: Payer: Medicare Other

## 2019-10-25 ENCOUNTER — Other Ambulatory Visit: Payer: Self-pay

## 2019-10-25 DIAGNOSIS — R2681 Unsteadiness on feet: Secondary | ICD-10-CM | POA: Diagnosis not present

## 2019-10-25 DIAGNOSIS — R2689 Other abnormalities of gait and mobility: Secondary | ICD-10-CM

## 2019-10-25 DIAGNOSIS — R262 Difficulty in walking, not elsewhere classified: Secondary | ICD-10-CM

## 2019-10-25 DIAGNOSIS — M6281 Muscle weakness (generalized): Secondary | ICD-10-CM

## 2019-10-25 NOTE — Therapy (Signed)
[place] MAIN [facility] SERVICES [address] [place], [place], [address] Phone: [phone]   Fax:  [phone]  Physical Therapy Treatment  Patient Details  Name: [name] MRN: [id number] Date of Birth: 08-02-1934 Referring Provider (PT): Eulogio Ditch   Encounter Date: 10/25/2019  PT End of Session - 10/25/19 1348    Visit Number  34    Number of Visits  45    Date for PT Re-Evaluation  11/28/19    Authorization Type  4/10 PN start 2/5    PT Start Time  1300    PT Stop Time  1343    PT Time Calculation (min)  43 min    Equipment Utilized During Treatment  Gait belt    Activity Tolerance  Patient tolerated treatment well    Behavior During Therapy  [facility] for tasks assessed/performed       Past Medical History:  Diagnosis Date  . AAA (abdominal aortic aneurysm) ([place])   . Arthritis   . Atherosclerotic heart disease of native coronary artery without angina pectoris 1998   - s/p CABG, occluded SVG-D1; patent LIMA-LAD, SVG-OM, SVG- RCA  . Basal cell carcinoma of eyelid 01/03/2013   2019 - R side of Nose  . Benign neoplasm of ascending colon   . Benign neoplasm of descending colon   . Cancer [facility])    Skin cancer  . Colon polyps   . COPD (chronic obstructive pulmonary disease) ([place])   . Coronary artery disease   . Dementia ([place])   . Dysrhythmia    atrial fib  . Falls frequently   . GERD (gastroesophageal reflux disease)   . Hemorrhoid 02/10/2015  . History of hiatal hernia   . History of kidney stones   . HOH (hard of hearing)   . Hypertension   . Memory loss   . Osteoporosis   . PAF (paroxysmal atrial fibrillation) ([place])   . Prostate cancer ([place])   . Prostate disorder   . Sciatic pain    Chronic  . Sleep apnea    cpap  . Temporary cerebral vascular dysfunction 02/10/2015   Had negative work-up.  July, 2012.  No medication changes, had forgot them that day, got dehydrated.   . Urinary incontinence     Past Surgical  History:  Procedure Laterality Date  . ABDOMINAL AORTIC ENDOVASCULAR STENT GRAFT Bilateral 05/18/2019   Procedure: ABDOMINAL AORTIC ENDOVASCULAR STENT GRAFT;  Surgeon: Algernon Huxley, MD;  Location: [facility] ORS;  Service: Vascular;  Laterality: Bilateral;  . ANKLE SURGERY    . Arch aortogram and carotid aortogram  06/02/2006   Dr Oneida Alar did surgery  . BASAL CELL CARCINOMA EXCISION  04/2016   Dermatology  . CARDIAC CATHETERIZATION  01/30/1998    (Dr. Linard Millers): Native LAD & mRCA CTO. 90% D1. Mod Cx. SVG-D1 CTO. SVG-RCA & SVG-OM along with LIMA-LAD patent;  LV NORMAL Fxn  . CAROTID ENDARTERECTOMY Left Oct. 29, 2007   Dr. Oneida Alar:  . CATARACT EXTRACTION W/PHACO Right 03/05/2017   Procedure: CATARACT EXTRACTION PHACO AND INTRAOCULAR LENS PLACEMENT (IOC);  Surgeon: Leandrew Koyanagi, MD;  Location: [facility] ORS;  Service: Ophthalmology;  Laterality: Right;  [place] 00:58.5 AP% 10.6 CDE 6.20 Fluid Pack lot # [id number]  . CATARACT EXTRACTION W/PHACO Left 04/15/2017   Procedure: CATARACT EXTRACTION PHACO AND INTRAOCULAR LENS PLACEMENT ([place])  Left;  Surgeon: Leandrew Koyanagi, MD;  Location: [place];  Service: Ophthalmology;  Laterality: Left;  . COLONOSCOPY WITH PROPOFOL N/A 09/15/2017  Procedure: COLONOSCOPY WITH PROPOFOL;  Surgeon: Lucilla Lame, MD;  Location: [facility] ENDOSCOPY;  Service: Endoscopy;  Laterality: N/A;  . COLONOSCOPY WITH PROPOFOL N/A 10/26/2018   Procedure: COLONOSCOPY WITH PROPOFOL;  Surgeon: Lucilla Lame, MD;  Location[facility] END[facility]COPY;  Service: Endoscopy;  Laterality: N/A;  . CORONARY ARTERY BYPASS GRAFT  1998   LIMA-LAD, SVG-OM, SVG-RPDA, SVG-DIAG  . CPET / MET  12/2012   Mild chronotropic incompetence - read 82% of predicted; also reduced effort; peak VO2 15.7 / 75% (did not reach Max effort) -- suggested ischemic response in last 1.5 minutes of exercise. Normal pulmonary function on PFTs but poor response to  . DOPPLER ECHOCARDIOGRAPHY  11/20/2010   EF =>55%; LV norm mild aortic  scelorosis  . ENDOVASCULAR REPAIR/STENT GRAFT N/A 05/18/2019   Procedure: ENDOVASCULAR REPAIR/STENT GRAFT;  Surgeon: Algernon Huxley, MD;  Location: [place] CV LAB;  Service: Cardiovascular;  Laterality: N/A;  . ESOPHAGOGASTRODUODENOSCOPY (EGD) WITH PROPOFOL N/A 09/15/2017   Procedure: ESOPHAGOGASTRODUODENOSCOPY (EGD) WITH PROPOFOL;  Surgeon: Lucilla Lame, MD;  Location: [facility] ENDOSCOPY;  Service: Endoscopy;  Laterality: N/A;  . FRACTURE SURGERY Right 03/19/2015   wrist  . HIATAL HERNIA REPAIR    . ILIAC ARTERY STENT  12/15/2005   PTA and direct stenting rgt and lft common iliac arteries  . KYPHOPLASTY N/A 04/05/2019   Procedure: KYPHOPLASTY T12, L3, L4 L5;  Surgeon: Hessie Knows, MD;  Location: [facility] ORS;  Service: Orthopedics;  Laterality: N/A;  . NM GATED MYOVIEW ([place] [facility])  12/2017   Low Risk - no ischemia or infarct.  EF > 65%. no RWMA  . NM MYOVIEW ([place] [facility])  11/11/2010; February 2016   a) TM STRESS----NORMAL PERFUSION ,EF 68%; b) EF 50-55%. Normal LV function. No significant ischemia or infarction.  . OPEN REDUCTION INTERNAL FIXATION (ORIF) DISTAL RADIAL FRACTURE Left 01/13/2019   Procedure: OPEN REDUCTION INTERNAL FIXATION (ORIF) DISTAL  RADIAL FRACTURE - LEFT - SLEEP APNEA;  Surgeon: Hessie Knows, MD;  Location: [facility] ORS;  Service: Orthopedics;  Laterality: Left;  . ORIF ELBOW FRACTURE Right 01/01/2017   Procedure: OPEN REDUCTION INTERNAL FIXATION (ORIF) ELBOW/OLECRANON FRACTURE;  Surgeon: Hessie Knows, MD;  Location: [facility] ORS;  Service: Orthopedics;  Laterality: Right;  . ORIF WRIST FRACTURE Right 03/19/2015   Procedure: OPEN REDUCTION INTERNAL FIXATION (ORIF) WRIST FRACTURE;  Surgeon: Hessie Knows, MD;  Location: [facility] ORS;  Service: Orthopedics;  Laterality: Right;  . THORACIC AORTA - CAROTID ANGIOGRAM  October 2007   Dr. 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 TRANSTHORACIC ECHOCARDIOGRAM  February  2016   [facility]: Normal LV function. Dilated left atrium.    There were no vitals filed for this visit.  Subjective Assessment - 10/25/19 1307    Subjective  Patient visited physician for foot pain treatment, reports foot pain at 6/10 this session. Patient reports no LOB/falls since last session.    Pertinent History  Patient referred to physical therapy for balance/strength issues s/p abdominal aortic aneurysm (AAA) without rupture repair on 05/18/19 .  Patient has been seen by this therapist in the past (last seen in march of 2020) Patient had a fall on 01/08/19, MRI from [facility] dated 03/28/19 report acute compression fractures at T12, L3 and L4 as well as facet arthropathy at the lower lumbar levels and at L3-4 moderate central stenosis. Patient underwent injection on 10/6 at L4-5 and L5-S1. PMH includes AAA, arthritis, atherosclerotic heart disease, (s/p CABG), benign neoplasm of  ascending/descending colon, COPD, CAD, dementia, dysrhythmia, frequent falls, GERD, hemorrhoid, HOH, HTN, PAF, prostate cancer. Monday through Friday have 3 people come in different times from 730 to 12pm. Uses a walker occasionally now    Limitations  Walking;Standing;House hold activities;Other (comment);Lifting    How long can you sit comfortably?  20 minutes before back is painful    How long can you stand comfortably?  hour    How long can you walk comfortably?  using a walker about 30 minutes    Patient Stated Goals  patient would like to improve his balance and strenght.    Currently in Pain?  Yes    Pain Score  6     Pain Location  Foot    Pain Orientation  Right    Pain Descriptors / Indicators  Aching    Pain Type  Chronic pain    Pain Onset  More than a month ago    Multiple Pain Sites  No    Pain Onset  More than a month ago         Seated rest breaks to monitor oxygen and HR. Breathing technique reminders required.  Therapeutic Exercise: NuStep Level 3, seat 12, 4 min, >60 RPM for cardiovascular challenge  and warm up prior to exercise  // bars, CGA: Red light/yellow light/green light game, for slow/normal/quick pace ambulation, BUE support, 2 min, VC for remembering gait speed required  4" step and airex balance, one LE on each surface, no UE support, 20 sec each LE  Cone taps to 3 cones in front, BUE support, 8 rounds each LE, for single leg dynamic balance, Mod VC for foot placement  4 airex pads spaced out for foot placement during gait, 2 lengths // bars BUE support, 2 lengths SUE support, 4 lengths no UE support with minor LOBs, for ambulating on unstable surfaces   Alternating step with rainbow ball toss to PT on side, 10 tosses to each side, Mod-Max VC required, for coordination and dynamic stability  STS with 2# bar press out upon standing, airex on chair to increase seat height, 10x   Seated: Crossbody elbow to boxing mitt, 10x total, for trunk rotation/abdominal activation/power  LAQ kick to boxing mitt, 10x total, for LE power    Patient reported with excellent motivation and wanted to perform standing exercises to improve balance despite foot pain. Patient displayed increased stability with balance exercises this session and performed well with LE power, as noted with LAQ kicks and STS. He continues to be challenged with ankle righting reactions and coordination, and requires seated rest breaks due to fatigue. PT instructed breathing technique/offerred reminders to breathe during rests. He required Mod-Max VC for moderate complexity tasks. Patient would benefit from further skilled PT intervention to increase strength and stability for safer ADL performance and increased functional ability.             PT Education - 10/25/19 1305    Education provided  Yes    Education Details  exercise technique, body mechanics    Person(s) Educated  Patient    Methods  Explanation;Demonstration;Tactile cues;Verbal cues    Comprehension  Verbalized understanding;Returned  demonstration;Verbal cues required;Tactile cues required       PT Short Term Goals - 10/03/19 1312      PT SHORT TERM GOAL #1   Title  Patient will be independent in home exercise program to improve strength/mobility for better functional independence with ADLs.    Baseline  10/16: HEP given  11/17: HEP compliant    Time  4    Period  Weeks    Status  Achieved    Target Date  07/15/19      PT SHORT TERM GOAL #2   Title  Patient will deny any falls over past 4 weeks to demonstrate improved safety awareness at home and work.    Baseline  10/16: one major fall 11/17: no falls    Time  4    Period  Weeks    Status  Achieved    Target Date  07/15/19        PT Long Term Goals - 10/03/19 1423      PT LONG TERM GOAL #1   Title  Patient will increase BLE gross strength to 4+/5 as to improve functional strength for independent gait, increased standing tolerance and increased ADL ability.    Baseline  10/16: R gross 4-/5 with add/ext 3+/5 L grossly 3+/5 with hip add/ext 2+/5 11/17: R 4/5 add/ext; -/5 L 4-/5 hip add/ext 3/5 12/8: R: 4/5 L 4-/5 hip add/ext 3+/5 2/1: R 4+/5 L 4/5 with hip ext 4-/5    Time  8    Period  Weeks    Status  Partially Met    Target Date  11/28/19      PT LONG TERM GOAL #2   Title  Patient (> 81 years old) will complete five times sit to stand test in < 15 seconds indicating an increased LE strength and improved balance.    Baseline  10/16: 17 seconds with heavy use of hands on knees 11/17: 16 seconds one near LOB 12/8: 13 seconds one posterior LOB    Time  8    Period  Weeks    Status  Achieved      PT LONG TERM GOAL #3   Title  Patient will increase Berg Balance score by > 6 points (43/56)  to demonstrate decreased fall risk during functional activities.    Baseline  10/16: 37/56 11/17: 42/56 12/8: 47/56    Time  8    Period  Weeks    Status  Achieved      PT LONG TERM GOAL #4   Title  Patient will increase 10 meter walk test to >1.51ms as to improve  gait speed for better community ambulation and to reduce fall risk.    Baseline  10/16: 0.91 w/o AD 11/17: 1.43 m/s with no AD    Time  8    Period  Weeks    Status  Achieved      PT LONG TERM GOAL #5   Title  Patient will increase ABC scale score >80% to demonstrate better functional mobility and better confidence with ADLs.    Baseline  10/16; 71% 11/17: 80% 12/8: 83%    Time  8    Period  Weeks    Status  Achieved      PT LONG TERM GOAL #6   Title  Patient will improve score on DGI to > or = 19/24 on the DGI to increase dynamic balance during mobility and to decrease risk of falls.    Baseline  12/8: perform next session 12/29: 18/24 2/1: 18/24    Time  8    Period  Weeks    Status  On-going    Target Date  11/28/19      PT LONG TERM GOAL #7   Title  Patient will increase Berg Balance score by > 6 points (  53/56)  to demonstrate decreased fall risk during functional activities.    Baseline  12/8; 47/56 12/29: 49/56 2/1: 46/56    Time  8    Period  Weeks    Status  On-going    Target Date  11/28/19            Plan - 10/25/19 1413    Clinical Impression Statement  Patient reported with excellent motivation and wanted to perform standing exercises to improve balance despite foot pain. Patient displayed increased stability with balance exercises this session and performed well with LE power, as noted with LAQ kicks and STS. He continues to be challenged with ankle righting reactions and coordination, and requires seated rest breaks due to fatigue. PT instructed breathing technique/offerred reminders to breathe during rests. He required Mod-Max VC for moderate complexity tasks. Patient would benefit from further skilled PT intervention to increase strength and stability for safer ADL performance and increased functional ability.    Personal Factors and Comorbidities  Age;Comorbidity 3+;Fitness;Past/Current Experience;Sex;Social Background;Time since onset of  injury/illness/exacerbation;Transportation    Comorbidities  AAA, arthritis, atherosclerotic heart disease, (s/p CABG), benign neoplasm of ascending/descending colon, COPD, CAD, dementia, dysrhythmia, frequent falls, GERD, hemorrhoid, HOH, HTN, PAF, prostate cancer.    Examination-Activity Limitations  Bathing;Bend;Carry;Dressing;Continence;Stairs;Squat;Reach Overhead;Locomotion Level;Lift;Stand;Transfers;Toileting    Examination-Participation Restrictions  Church;Cleaning;Community Activity;Driving;Interpersonal Relationship;Laundry;Volunteer;Shop;Personal Finances;Meal Prep;Yard Work    [employer] Potential  Fair    Clinical Impairments Affecting Rehab Potential  (+) good support system, high prior level of function (-) currently undergoing radiation therapy for prostate cancer, memory deficits     PT Frequency  2x / week    PT Duration  8 weeks    PT Treatment/Interventions  ADLs/Self Care Home Management;Aquatic Therapy;Cryotherapy;Moist Heat;Traction;Ultrasound;Therapeutic activities;Functional mobility training;Stair training;Gait training;DME Instruction;Therapeutic exercise;Balance training;Neuromuscular re-education;Patient/family education;Manual techniques;Passive range of motion;Energy conservation;Vestibular;Taping    PT Next Visit Plan  progress dynamic stability    PT Home Exercise Plan  see above    Consulted and Agree with Plan of Care  Patient       Patient will benefit from skilled therapeutic intervention in order to improve the following deficits and impairments:  Abnormal gait, Cardiopulmonary status limiting activity, Decreased activity tolerance, Decreased balance, Decreased knowledge of precautions, Decreased endurance, Decreased coordination, Decreased cognition, Decreased knowledge of use of DME, Decreased mobility, Difficulty walking, Decreased safety awareness, Decreased strength, Impaired flexibility, Impaired perceived functional ability, Impaired sensation, Postural  dysfunction, Improper body mechanics, Pain  Visit Diagnosis: Unsteadiness on feet  Muscle weakness (generalized)  Other abnormalities of gait and mobility  Difficulty in walking, not elsewhere classified     Problem List Patient Active Problem List   Diagnosis Date Noted  . Status post abdominal aortic aneurysm (AAA) repair 04/15/2019  . Candidiasis 02/22/2019  . Malignant neoplasm of descending colon ([place])   . Benign neoplasm of descending colon   . Benign neoplasm of transverse colon   . Do not intubate, cardiopulmonary resuscitation (CPR)-only code status 10/08/2018  . Prostate cancer ([place]) 02/25/2018  . Prostatic cyst 01/15/2018  . 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 ([place]) 09/15/2017  . Blood in stool   . Abnormal feces   . Stricture and stenosis of esophagus   . Mild cognitive impairment 08/18/2017  . Senile purpura ([place]) 04/29/2017  . Anemia 04/29/2017  . Frequent falls 04/29/2017  . Acute respiratory failure ([place]) 12/17/2016  . Simple chronic bronchitis ([place]) 12/17/2016  . Benign localized hyperplasia of prostate with urinary obstruction 07/15/2016  . Elevated PSA 07/15/2016  . Incomplete  emptying of bladder 07/15/2016  . Urge incontinence 07/15/2016  . Hypothyroidism 04/25/2016  . Macular degeneration 04/25/2016  . Erectile dysfunction due to arterial insufficiency   . Ischemic heart disease with chronotropic incompetence   . CN (constipation) 02/10/2015  . DD (diverticular disease) 02/10/2015  . Fatigue 02/10/2015  . Lichen planus 51/98/2429  . Restless leg 02/10/2015  . 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 B12 deficiency 02/10/2015  . COPD (chronic obstructive pulmonary disease) ([place]) 11/14/2014  . Post Inflammatory Lung Changes 11/14/2014  . PAF (paroxysmal atrial fibrillation) ([facility]) CHA2DS2-VASc = 4. AC = Eliquis   . Insomnia 12/09/2013  . OSA on CPAP   . Dyspnea on exertion - -essentially resolved with BB dose reduction & wgt loss 03/29/2013  .  Overweight (BMI 25.0-29.9) -- Wgt back up 01/17/2013  . Atherosclerotic heart disease of native coronary artery without angina pectoris - s/p CABG, occluded SVG-D1; patent LIMA-LAD, SVG-OM, SVG- RCA   . Essential hypertension   . Hyperlipidemia with target LDL less than 70   . Aorto-iliac disease ([place])   . BCC (basal cell carcinoma), eyelid 01/03/2013  . Allergic rhinitis 08/17/2009  . Adaptation reaction 05/28/2009  . Acid reflux 05/28/2009  . Arthritis, degenerative 05/28/2009  . 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 Carotid artery occlusion without infarction 06/01/2006  . PAD (peripheral artery disease) - bilateral common iliac stents 12/15/2005  . Aortoiliac occlusive disease ([place]) 11/30/2005  . Atherosclerotic heart disease of artery bypass graft 09/01/1997    Florinda Marker, SPT This entire session was performed under direct supervision and direction of a licensed therapist/therapist assistant . I have personally read, edited and approve of the note as written.  Janna Arch, PT, DPT   10/25/2019, 2:13 PM  [place] MAIN [facility] SERVICES [address] [place], [place], [address] Phone: [phone]   Fax:  [phone]  Name: [name] MRN: [id number] Date of Birth: 1934-05-22

## 2019-10-27 ENCOUNTER — Ambulatory Visit: Payer: Medicare Other

## 2019-10-27 ENCOUNTER — Other Ambulatory Visit: Payer: Self-pay

## 2019-10-27 DIAGNOSIS — R2689 Other abnormalities of gait and mobility: Secondary | ICD-10-CM

## 2019-10-27 DIAGNOSIS — R262 Difficulty in walking, not elsewhere classified: Secondary | ICD-10-CM

## 2019-10-27 DIAGNOSIS — R2681 Unsteadiness on feet: Secondary | ICD-10-CM

## 2019-10-27 DIAGNOSIS — M6281 Muscle weakness (generalized): Secondary | ICD-10-CM

## 2019-10-27 NOTE — Therapy (Signed)
[place] MAIN [facility] SERVICES [address] [place], [place], [address] Phone: [phone]   Fax:  [phone]  Physical Therapy Treatment  Patient Details  Name: [name] MRN: [id number] Date of Birth: 08/10/1934 Referring Provider (PT): Eulogio Ditch   Encounter Date: 10/27/2019  PT End of Session - 10/27/19 1347    Visit Number  35    Number of Visits  45    Date for PT Re-Evaluation  11/28/19    Authorization Type  5/10 PN start 2/5    PT Start Time  1300    PT Stop Time  1340    PT Time Calculation (min)  40 min    Equipment Utilized During Treatment  Gait belt    Activity Tolerance  Patient tolerated treatment well    Behavior During Therapy  [facility] for tasks assessed/performed       Past Medical History:  Diagnosis Date  . AAA (abdominal aortic aneurysm) ([place])   . Arthritis   . Atherosclerotic heart disease of native coronary artery without angina pectoris 1998   - s/p CABG, occluded SVG-D1; patent LIMA-LAD, SVG-OM, SVG- RCA  . Basal cell carcinoma of eyelid 01/03/2013   2019 - R side of Nose  . Benign neoplasm of ascending colon   . Benign neoplasm of descending colon   . Cancer [facility])    Skin cancer  . Colon polyps   . COPD (chronic obstructive pulmonary disease) ([place])   . Coronary artery disease   . Dementia ([place])   . Dysrhythmia    atrial fib  . Falls frequently   . GERD (gastroesophageal reflux disease)   . Hemorrhoid 02/10/2015  . History of hiatal hernia   . History of kidney stones   . HOH (hard of hearing)   . Hypertension   . Memory loss   . Osteoporosis   . PAF (paroxysmal atrial fibrillation) ([place])   . Prostate cancer ([place])   . Prostate disorder   . Sciatic pain    Chronic  . Sleep apnea    cpap  . Temporary cerebral vascular dysfunction 02/10/2015   Had negative work-up.  July, 2012.  No medication changes, had forgot them that day, got dehydrated.   . Urinary incontinence     Past Surgical  History:  Procedure Laterality Date  . ABDOMINAL AORTIC ENDOVASCULAR STENT GRAFT Bilateral 05/18/2019   Procedure: ABDOMINAL AORTIC ENDOVASCULAR STENT GRAFT;  Surgeon: Algernon Huxley, MD;  Location: [facility] ORS;  Service: Vascular;  Laterality: Bilateral;  . ANKLE SURGERY    . Arch aortogram and carotid aortogram  06/02/2006   Dr Oneida Alar did surgery  . BASAL CELL CARCINOMA EXCISION  04/2016   Dermatology  . CARDIAC CATHETERIZATION  01/30/1998    (Dr. Linard Millers): Native LAD & mRCA CTO. 90% D1. Mod Cx. SVG-D1 CTO. SVG-RCA & SVG-OM along with LIMA-LAD patent;  LV NORMAL Fxn  . CAROTID ENDARTERECTOMY Left Oct. 29, 2007   Dr. Oneida Alar:  . CATARACT EXTRACTION W/PHACO Right 03/05/2017   Procedure: CATARACT EXTRACTION PHACO AND INTRAOCULAR LENS PLACEMENT (IOC);  Surgeon: Leandrew Koyanagi, MD;  Location: [facility] ORS;  Service: Ophthalmology;  Laterality: Right;  [place] 00:58.5 AP% 10.6 CDE 6.20 Fluid Pack lot # [id number]  . CATARACT EXTRACTION W/PHACO Left 04/15/2017   Procedure: CATARACT EXTRACTION PHACO AND INTRAOCULAR LENS PLACEMENT ([place])  Left;  Surgeon: Leandrew Koyanagi, MD;  Location: [place];  Service: Ophthalmology;  Laterality: Left;  . COLONOSCOPY WITH PROPOFOL N/A 09/15/2017  Procedure: COLONOSCOPY WITH PROPOFOL;  Surgeon: Lucilla Lame, MD;  [facility] END[facility]COPY;  Service: Endoscopy;  Laterality: N/A;  . COLONOSCOPY WITH PROPOFOL N/A 10/26/2018   Procedure: COLONOSCOPY WITH PROPOFOL;  Surgeon: Lucilla Lame, MD;  Location: [facility] ENDOSCOPY;  Service: Endoscopy;  Laterality: N/A;  . CORONARY ARTERY BYPASS GRAFT  1998   LIMA-LAD, SVG-OM, SVG-RPDA, SVG-DIAG  . CPET / MET  12/2012   Mild chronotropic incompetence - read 82% of predicted; also reduced effort; peak VO2 15.7 / 75% (did not reach Max effort) -- suggested ischemic response in last 1.5 minutes of exercise. Normal pulmonary function on PFTs but poor response to  . DOPPLER ECHOCARDIOGRAPHY  11/20/2010   EF =>55%; LV norm mild aortic  scelorosis  . ENDOVASCULAR REPAIR/STENT GRAFT N/A 05/18/2019   Procedure: ENDOVASCULAR REPAIR/STENT GRAFT;  Surgeon: Algernon Huxley, MD;  Location: [place] [facility];  Service: Cardiovascular;  Laterality: N/A;  . ESOPHAGOGASTRODUODENOSCOPY (EGD) WITH PROPOFOL N/A 09/15/2017   Procedure: ESOPHAGOGASTRODUODENOSCOPY (EGD) WITH PROPOFOL;  Surgeon: Lucilla Lame, MD;  Location: [facility] ENDOSCOPY;  Service: Endoscopy;  Laterality: N/A;  . FRACTURE SURGERY Right 03/19/2015   wrist  . HIATAL HERNIA REPAIR    . ILIAC ARTERY STENT  12/15/2005   PTA and direct stenting rgt and lft common iliac arteries  . KYPHOPLASTY N/A 04/05/2019   Procedure: KYPHOPLASTY T12, L3, L4 L5;  Surgeon: Hessie Knows, MD;  Location: [facility] ORS;  Service: Orthopedics;  Laterality: N/A;  . NM GATED MYOVIEW ([place] [facility]X)  12/2017   Low Risk - no ischemia or infarct.  EF > 65%. no RWMA  . NM MYOVIEW ([place] [facility])  11/11/2010; February 2016   a) TM STRESS----NORMAL PERFUSION ,EF 68%; b) EF 50-55%. Normal LV function. No significant ischemia or infarction.  . OPEN REDUCTION INTERNAL FIXATION (ORIF) DISTAL RADIAL FRACTURE Left 01/13/2019   Procedure: OPEN REDUCTION INTERNAL FIXATION (ORIF) DISTAL  RADIAL FRACTURE - LEFT - SLEEP APNEA;  Surgeon: Hessie Knows, MD;  Location: [facility] ORS;  Service: Orthopedics;  Laterality: Left;  . ORIF ELBOW FRACTURE Right 01/01/2017   Procedure: OPEN REDUCTION INTERNAL FIXATION (ORIF) ELBOW/OLECRANON FRACTURE;  Surgeon: Hessie Knows, MD;  Location: [facility] ORS;  Service: Orthopedics;  Laterality: Right;  . ORIF WRIST FRACTURE Right 03/19/2015   Procedure: OPEN REDUCTION INTERNAL FIXATION (ORIF) WRIST FRACTURE;  Surgeon: Hessie Knows, MD;  Location: [facility] ORS;  Service: Orthopedics;  Laterality: Right;  . THORACIC AORTA - CAROTID ANGIOGRAM  October 2007   Dr. 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 TRANSTHORACIC ECHOCARDIOGRAM  February  2016   [facility]: Normal LV function. Dilated left atrium.    There were no vitals filed for this visit.  Seated rest breaks to monitor oxygen and HR. Breathing technique reminders required.  O2 down to 89%, increased with seated rest break to breathe  Therapeutic Exercise: NuStep Level 3, seat 12, 4 min, >60 RPM for cardiovascular challenge and warm up prior to exercise   // bars, CGA:  Rainbow ball toss to alternating sides, 10x total, for dynamic balance  Standing hits to boxing mitts with target alternating speed and placement, 2x 20 sec, for reaction speed and dynamic stability  March to side kick, BUE support, 10x each LE, involving coordination challenge, Min cueing for decreased hip ER to target proper musculature  Front kicks, BUE support, for coordination challenge and dynamic stability  Close stance balance on airex, no UE support, 20 sec  Standing  airex marches, slight SUE support, 2x 10 total, for increased balance and coordination challenge, cueing for decreased speed for longer duration of single leg stance  Obstacle course stepping over hurdle onto airex beam, ambulating across beam, and over hurdle to step off, slight BUE support (only lightly touching // bars), 4 lengths // bars  Standing soccer kicks, 2x 10, BUE support, for dynamic stability/coordination/reaction speed   Patient reported with excellent motivation. He was able to tolerate increased amount of standing exercises this session due to relieved foot pain, so more balance and standing coordination exercises were performed. Patient performed well this session with ankle righting reactions on unstable surfaces and displayed decreased reliance on UE support. He also performed well with coordination, requiring only Min VC. Patient continues to be challenged by dynamic stability and strength. He requires seated rest breaks to improve decreased O2 with proper breathing mechanics and for PT to monitor vitals. Patient  would benefit from further skilled PT intervention to increase strength and stability for safer ADL performance and increased functional ability.          Subjective Assessment - 10/27/19 1305    Subjective  Patient reports foot pain relief after physician treatment and no pain today. No falls/LOB since last session.    Pertinent History  Patient referred to physical therapy for balance/strength issues s/p abdominal aortic aneurysm (AAA) without rupture repair on 05/18/19 .  Patient has been seen by this therapist in the past (last seen in march of 2020) Patient had a fall on 01/08/19, [facility]28[facility] report acute compression fractures at T12, L3 and L4 as well as facet arthropathy at the lower lumbar levels and at L3-4 moderate central stenosis. Patient underwent injection on 10/6 at L4-5 and L5-S1. PMH includes AAA, arthritis, atherosclerotic heart disease, (s/p CABG), benign neoplasm of ascending/descending colon, COPD, CAD, dementia, dysrhythmia, frequent falls, GERD, hemorrhoid, HOH, HTN, PAF, prostate cancer. Monday through Friday have 3 people come in different times from 730 to 12pm. Uses a walker occasionally now    Limitations  Walking;Standing;House hold activities;Other (comment);Lifting    How long can you sit comfortably?  20 minutes before back is painful    How long can you stand comfortably?  hour    How long can you walk comfortably?  using a walker about 30 minutes    Patient Stated Goals  patient would like to improve his balance and strenght.    Currently in Pain?  No/denies    Pain Score  0-No pain    Pain Onset  More than a month ago    Multiple Pain Sites  No    Pain Onset  More than a month ago                               PT Education - 10/27/19 1346    Education provided  Yes    Education Details  exercise technique, body mechanics    Person(s) Educated  Patient    Methods  Explanation;Demonstration;Tactile cues;Verbal cues     Comprehension  Verbalized understanding;Returned demonstration;Verbal cues required;Tactile cues required       PT Short Term Goals - 10/03/19 1312      PT SHORT TERM GOAL #1   Title  Patient will be independent in home exercise program to improve strength/mobility for better functional independence with ADLs.    Baseline  10/16: HEP given 11/17: HEP compliant  Time  4    Period  Weeks    Status  Achieved    Target Date  07/15/19      PT SHORT TERM GOAL #2   Title  Patient will deny any falls over past 4 weeks to demonstrate improved safety awareness at home and work.    Baseline  10/16: one major fall 11/17: no falls    Time  4    Period  Weeks    Status  Achieved    Target Date  07/15/19        PT Long Term Goals - 10/03/19 1423      PT LONG TERM GOAL #1   Title  Patient will increase BLE gross strength to 4+/5 as to improve functional strength for independent gait, increased standing tolerance and increased ADL ability.    Baseline  10/16: R gross 4-/5 with add/ext 3+/5 L grossly 3+/5 with hip add/ext 2+/5 11/17: R 4/5 add/ext; -/5 L 4-/5 hip add/ext 3/5 12/8: R: 4/5 L 4-/5 hip add/ext 3+/5 2/1: R 4+/5 L 4/5 with hip ext 4-/5    Time  8    Period  Weeks    Status  Partially Met    Target Date  11/28/19      PT LONG TERM GOAL #2   Title  Patient (> 55 years old) will complete five times sit to stand test in < 15 seconds indicating an increased LE strength and improved balance.    Baseline  10/16: 17 seconds with heavy use of hands on knees 11/17: 16 seconds one near LOB 12/8: 13 seconds one posterior LOB    Time  8    Period  Weeks    Status  Achieved      PT LONG TERM GOAL #3   Title  Patient will increase Berg Balance score by > 6 points (43/56)  to demonstrate decreased fall risk during functional activities.    Baseline  10/16: 37/56 11/17: 42/56 12/8: 47/56    Time  8    Period  Weeks    Status  Achieved      PT LONG TERM GOAL #4   Title  Patient will  increase 10 meter walk test to >1.4ms as to improve gait speed for better community ambulation and to reduce fall risk.    Baseline  10/16: 0.91 w/o AD 11/17: 1.43 m/s with no AD    Time  8    Period  Weeks    Status  Achieved      PT LONG TERM GOAL #5   Title  Patient will increase ABC scale score >80% to demonstrate better functional mobility and better confidence with ADLs.    Baseline  10/16; 71% 11/17: 80% 12/8: 83%    Time  8    Period  Weeks    Status  Achieved      PT LONG TERM GOAL #6   Title  Patient will improve score on DGI to > or = 19/24 on the DGI to increase dynamic balance during mobility and to decrease risk of falls.    Baseline  12/8: perform next session 12/29: 18/24 2/1: 18/24    Time  8    Period  Weeks    Status  On-going    Target Date  11/28/19      PT LONG TERM GOAL #7   Title  Patient will increase Berg Balance score by > 6 points (53/56)  to demonstrate decreased fall  risk during functional activities.    Baseline  12/8; 47/56 12/29: 49/56 2/1: 46/56    Time  8    Period  Weeks    Status  On-going    Target Date  11/28/19            Plan - 10/27/19 1411    Clinical Impression Statement  Patient reported with excellent motivation. He was able to tolerate increased amount of standing exercises this session due to relieved foot pain, so more balance and standing coordination exercises were performed. Patient performed well this session with ankle righting reactions on unstable surfaces and displayed decreased reliance on UE support. He also performed well with coordination, requiring only Min VC. Patient continues to be challenged by dynamic stability and strength. He requires seated rest breaks to improve decreased O2 with proper breathing mechanics and for PT to monitor vitals. Patient would benefit from further skilled PT intervention to increase strength and stability for safer ADL performance and increased functional ability.    Personal Factors  and Comorbidities  Age;Comorbidity 3+;Fitness;Past/Current Experience;Sex;Social Background;Time since onset of injury/illness/exacerbation;Transportation    Comorbidities  AAA, arthritis, atherosclerotic heart disease, (s/p CABG), benign neoplasm of ascending/descending colon, COPD, CAD, dementia, dysrhythmia, frequent falls, GERD, hemorrhoid, HOH, HTN, PAF, prostate cancer.    Examination-Activity Limitations  Bathing;Bend;Carry;Dressing;Continence;Stairs;Squat;Reach Overhead;Locomotion Level;Lift;Stand;Transfers;Toileting    Examination-Participation Restrictions  Church;Cleaning;Community Activity;Driving;Interpersonal Relationship;Laundry;Volunteer;Shop;Personal Finances;Meal Prep;Yard Work    [employer] Potential  Fair    Clinical Impairments Affecting Rehab Potential  (+) good support system, high prior level of function (-) currently undergoing radiation therapy for prostate cancer, memory deficits     PT Frequency  2x / week    PT Duration  8 weeks    PT Treatment/Interventions  ADLs/Self Care Home Management;Aquatic Therapy;Cryotherapy;Moist Heat;Traction;Ultrasound;Therapeutic activities;Functional mobility training;Stair training;Gait training;DME Instruction;Therapeutic exercise;Balance training;Neuromuscular re-education;Patient/family education;Manual techniques;Passive range of motion;Energy conservation;Vestibular;Taping    PT Next Visit Plan  progress dynamic stability    PT Home Exercise Plan  see above    Consulted and Agree with Plan of Care  Patient       Patient will benefit from skilled therapeutic intervention in order to improve the following deficits and impairments:  Abnormal gait, Cardiopulmonary status limiting activity, Decreased activity tolerance, Decreased balance, Decreased knowledge of precautions, Decreased endurance, Decreased coordination, Decreased cognition, Decreased knowledge of use of DME, Decreased mobility, Difficulty walking, Decreased safety awareness,  Decreased strength, Impaired flexibility, Impaired perceived functional ability, Impaired sensation, Postural dysfunction, Improper body mechanics, Pain  Visit Diagnosis: Unsteadiness on feet  Muscle weakness (generalized)  Other abnormalities of gait and mobility  Difficulty in walking, not elsewhere classified     Problem List Patient Active Problem List   Diagnosis Date Noted  . Status post abdominal aortic aneurysm (AAA) repair 04/15/2019  . Candidiasis 02/22/2019  . Malignant neoplasm of descending colon ([place])   . Benign neoplasm of descending colon   . Benign neoplasm of transverse colon   . Do not intubate, cardiopulmonary resuscitation (CPR)-only code status 10/08/2018  . Prostate cancer ([place]) 02/25/2018  . Prostatic cyst 01/15/2018  . 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 ([place]) 09/15/2017  . Blood in stool   . Abnormal feces   . Stricture and stenosis of esophagus   . Mild cognitive impairment 08/18/2017  . Senile purpura ([place]) 04/29/2017  . Anemia 04/29/2017  . Frequent falls 04/29/2017  . Acute respiratory failure ([place]) 12/17/2016  . Simple chronic bronchitis ([place]) 12/17/2016  . Benign localized hyperplasia of prostate with urinary obstruction 07/15/2016  .  Elevated PSA 07/15/2016  . Incomplete emptying of bladder 07/15/2016  . Urge incontinence 07/15/2016  . Hypothyroidism 04/25/2016  . Macular degeneration 04/25/2016  . Erectile dysfunction due to arterial insufficiency   . Ischemic heart disease with chronotropic incompetence   . CN (constipation) 02/10/2015  . DD (diverticular disease) 02/10/2015  . Fatigue 02/10/2015  . Lichen planus 11/88/6773  . Restless leg 02/10/2015  . 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 B12 deficiency 02/10/2015  . COPD (chronic obstructive pulmonary disease) ([place]) 11/14/2014  . Post Inflammatory Lung Changes 11/14/2014  . PAF (paroxysmal atrial fibrillation) ([facility]) CHA2DS2-VASc = 4. AC = Eliquis   . Insomnia 12/09/2013  . OSA  on CPAP   . Dyspnea on exertion - -essentially resolved with BB dose reduction & wgt loss 03/29/2013  . Overweight (BMI 25.0-29.9) -- Wgt back up 01/17/2013  . Atherosclerotic heart disease of native coronary artery without angina pectoris - s/p CABG, occluded SVG-D1; patent LIMA-LAD, SVG-OM, SVG- RCA   . Essential hypertension   . Hyperlipidemia with target LDL less than 70   . Aorto-iliac disease ([place])   . BCC (basal cell carcinoma), eyelid 01/03/2013  . Allergic rhinitis 08/17/2009  . Adaptation reaction 05/28/2009  . Acid reflux 05/28/2009  . Arthritis, degenerative 05/28/2009  . 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 Carotid artery occlusion without infarction 06/01/2006  . PAD (peripheral artery disease) - bilateral common iliac stents 12/15/2005  . Aortoiliac occlusive disease ([place]) 11/30/2005  . Atherosclerotic heart disease of artery bypass graft 09/01/1997    Florinda Marker, SPT This entire session was performed under direct supervision and direction of a licensed therapist/therapist assistant . I have personally read, edited and approve of the note as written.  Janna Arch, PT, DPT    10/27/2019, 2:12 PM  [employer] MAIN [facility] SERVICES [address] [place], [place], [address] Phone: [phone]   Fax:  [phone]  Name: KAWON WILLCUTT MRN: [id number] Date of Birth: August 12, 1934

## 2019-10-28 ENCOUNTER — Other Ambulatory Visit: Payer: Self-pay | Admitting: Family Medicine

## 2019-10-28 DIAGNOSIS — J301 Allergic rhinitis due to pollen: Secondary | ICD-10-CM

## 2019-10-28 NOTE — Telephone Encounter (Signed)
Requested Prescriptions  Pending Prescriptions Disp Refills  . fluticasone (FLONASE) 50 MCG/ACT nasal spray [Pharmacy Med Name: FLUTICASONE PROPIONATE 50 MCG/ACT N] 16 g 4    Sig: PLACE TWO SPRAYS IN BOTH NOSRTILS EVERY DAY AS NEEDED FOR ALLERGIES     Ear, Nose, and Throat: Nasal Preparations - Corticosteroids Passed - 10/28/2019 11:54 AM      Passed - Valid encounter within last 12 months    Recent Outpatient Visits          4 months ago Loss of appetite   [facility] [name], MD   11 months ago Cellulitis, unspecified cellulitis site   [facility] [name], MD   1 year ago Centrilobular emphysema [facility])   [facility] [name], MD   1 year ago Atherosclerosis of native coronary artery of native heart without angina pectoris   [facility] [name], MD   2 years ago Gastrointestinal hemorrhage associated with gastritis, unspecified gastritis type   [facility] [name], MD      Future Appointments            In 1 month [place], Ronda Fairly, MD [place]

## 2019-10-31 ENCOUNTER — Other Ambulatory Visit: Payer: Self-pay

## 2019-10-31 ENCOUNTER — Ambulatory Visit: Payer: Medicare Other | Attending: Nurse Practitioner

## 2019-10-31 DIAGNOSIS — R2689 Other abnormalities of gait and mobility: Secondary | ICD-10-CM

## 2019-10-31 DIAGNOSIS — R262 Difficulty in walking, not elsewhere classified: Secondary | ICD-10-CM | POA: Insufficient documentation

## 2019-10-31 DIAGNOSIS — R2681 Unsteadiness on feet: Secondary | ICD-10-CM

## 2019-10-31 DIAGNOSIS — M6281 Muscle weakness (generalized): Secondary | ICD-10-CM

## 2019-10-31 NOTE — Therapy (Signed)
[place] MAIN [facility] SERVICES [address] [place], [place], [address] Phone: [phone]   Fax:  [phone]  Physical Therapy Treatment  Patient Details  Name: [name] MRN: [id number] Date of Birth: [birth year] Referring Provider (PT): Eulogio Ditch   Encounter Date: 10/31/2019  PT End of Session - 10/31/19 1021    Visit Number  36    Number of Visits  45    Date for PT Re-Evaluation  11/28/19    Authorization Type  6/10 PN start 2/5    PT Start Time  1015    PT Stop Time  1059    PT Time Calculation (min)  44 min    Equipment Utilized During Treatment  Gait belt    Activity Tolerance  Patient tolerated treatment well    Behavior During Therapy  WFL for tasks assessed/performed       Past Medical History:  Diagnosis Date  . AAA (abdominal aortic aneurysm) ([place])   . Arthritis   . Atherosclerotic heart disease of native coronary artery without angina pectoris 1998   - s/p CABG, occluded SVG-D1; patent LIMA-LAD, SVG-OM, SVG- RCA  . Basal cell carcinoma of eyelid 01/03/2013   2019 - R side of Nose  . Benign neoplasm of ascending colon   . Benign neoplasm of descending colon   . Cancer [facility])    Skin cancer  . Colon polyps   . COPD (chronic obstructive pulmonary disease) ([place])   . Coronary artery disease   . Dementia ([place])   . Dysrhythmia    atrial fib  . Falls frequently   . GERD (gastroesophageal reflux disease)   . Hemorrhoid 02/10/2015  . History of hiatal hernia   . History of kidney stones   . HOH (hard of hearing)   . Hypertension   . Memory loss   . Osteoporosis   . PAF (paroxysmal atrial fibrillation) ([place])   . Prostate cancer ([place])   . Prostate disorder   . Sciatic pain    Chronic  . Sleep apnea    cpap  . Temporary cerebral vascular dysfunction 02/10/2015   Had negative work-up.  July, 2012.  No medication changes, had forgot them that day, got dehydrated.   . Urinary incontinence     Past Surgical  History:  Procedure Laterality Date  . ABDOMINAL AORTIC ENDOVASCULAR STENT GRAFT Bilateral 05/18/2019   Procedure: ABDOMINAL AORTIC ENDOVASCULAR STENT GRAFT;  Surgeon: Algernon Huxley, MD;  Location: [facility] ORS;  Service: Vascular;  Laterality: Bilateral;  . ANKLE SURGERY    . Arch aortogram and carotid aortogram  06/02/2006   Dr Oneida Alar did surgery  . BASAL CELL CARCINOMA EXCISION  04/2016   Dermatology  . CARDIAC CATHETERIZATION  01/30/1998    (Dr. Linard Millers): Native LAD & mRCA CTO. 90% D1. Mod Cx. SVG-D1 CTO. SVG-RCA & SVG-OM along with LIMA-LAD patent;  LV NORMAL Fxn  . CAROTID ENDARTERECTOMY Left Oct. 29, 2007   Dr. Oneida Alar:  . CATARACT EXTRACTION W/PHACO Right 03/05/2017   Procedure: CATARACT EXTRACTION PHACO AND INTRAOCULAR LENS PLACEMENT (IOC);  Surgeon: Leandrew Koyanagi, MD;  Location: [facility] ORS;  Service: Ophthalmology;  Laterality: Right;  [place] 00:58.5 AP% 10.6 CDE 6.20 Fluid Pack lot # [id number]  . CATARACT EXTRACTION W/PHACO Left 04/15/2017   Procedure: CATARACT EXTRACTION PHACO AND INTRAOCULAR LENS PLACEMENT ([place])  Left;  Surgeon: Leandrew Koyanagi, MD;  Location: [place];  Service: Ophthalmology;  Laterality: Left;  . COLONOSCOPY WITH PROPOFOL N/A 09/15/2017  Procedure: COLONOSCOPY WITH PROPOFOL;  Surgeon: Lucilla Lame, MD;  Location: [facility] ENDOSCOPY;  Service: Endoscopy;  Laterality: N/A;  . COLONOSCOPY WITH PROPOFOL N/A 10/26/2018   Procedure: COLONOSCOPY WITH PROPOFOL;  Surgeon: Lucilla Lame, MD;  Location: [facility] ENDOSCOPY;  Service: Endoscopy;  Laterality: N/A;  . CORONARY ARTERY BYPASS GRAFT  1998   LIMA-LAD, SVG-OM, SVG-RPDA, SVG-DIAG  . CPET / MET  12/2012   Mild chronotropic incompetence - read 82% of predicted; also reduced effort; peak VO2 15.7 / 75% (did not reach Max effort) -- suggested ischemic response in last 1.5 minutes of exercise. Normal pulmonary function on PFTs but poor response to  . DOPPLER ECHOCARDIOGRAPHY  11/20/2010   EF =>55%; LV norm mild aortic  scelorosis  . ENDOVASCULAR REPAIR/STENT GRAFT N/A 05/18/2019   Procedure: ENDOVASCULAR REPAIR/STENT GRAFT;  Surgeon: Algernon Huxley, MD;  Location: [place] CV LAB;  Service: Cardiovascular;  Laterality: N/A;  . ESOPHAGOGASTRODUODENOSCOPY (EGD) WITH PROPOFOL N/A 09/15/2017   Procedure: ESOPHAGOGASTRODUODENOSCOPY (EGD) WITH PROPOFOL;  Surgeon: Lucilla Lame, MD;  Location: [facility] ENDOSCOPY;  Service: Endoscopy;  Laterality: N/A;  . FRACTURE SURGERY Right 03/19/2015   wrist  . HIATAL HERNIA REPAIR    . ILIAC ARTERY STENT  12/15/2005   PTA and direct stenting rgt and lft common iliac arteries  . KYPHOPLASTY N/A 04/05/2019   Procedure: KYPHOPLASTY T12, L3, L4 L5;  Surgeon: Hessie Knows, MD;  Location: [facility] ORS;  Service: Orthopedics;  Laterality: N/A;  . NM GATED MYOVIEW ([place] [facility])  12/2017   Low Risk - no ischemia or infarct.  EF > 65%. no RWMA  . NM MYOVIEW ([place] [facility])  11/11/2010; February 2016   a) TM STRESS----NORMAL PERFUSION ,EF 68%; b) EF 50-55%. Normal LV function. No significant ischemia or infarction.  . OPEN REDUCTION INTERNAL FIXATION (ORIF) DISTAL RADIAL FRACTURE Left 01/13/2019   Procedure: OPEN REDUCTION INTERNAL FIXATION (ORIF) DISTAL  RADIAL FRACTURE - LEFT - SLEEP APNEA;  Surgeon: Hessie Knows, MD;  Location: [facility] ORS;  Service: Orthopedics;  Laterality: Left;  . ORIF ELBOW FRACTURE Right 01/01/2017   Procedure: OPEN REDUCTION INTERNAL FIXATION (ORIF) ELBOW/OLECRANON FRACTURE;  Surgeon: Hessie Knows, MD;  Location: [facility] ORS;  Service: Orthopedics;  Laterality: Right;  . ORIF WRIST FRACTURE Right 03/19/2015   Procedure: OPEN REDUCTION INTERNAL FIXATION (ORIF) WRIST FRACTURE;  Surgeon: Hessie Knows, MD;  Location: [facility] ORS;  Service: Orthopedics;  Laterality: Right;  . THORACIC AORTA - CAROTID ANGIOGRAM  October 2007   Dr. 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 TRANSTHORACIC ECHOCARDIOGRAM  February  2016   [facility]: Normal LV function. Dilated left atrium.    There were no vitals filed for this visit.  Subjective Assessment - 10/31/19 1020    Subjective  Patient reports he walked one day last week before the weather turned nasty. Walks in the house when he can't walk outside. No stumbles or falls since last session.    Pertinent History  Patient referred to physical therapy for balance/strength issues s/p abdominal aortic aneurysm (AAA) without rupture repair on 05/18/19 .  Patient has been seen by this therapist in the past (last seen in march of 2020) Patient had a fall on 01/08/19, [facility] dated 03/28/19 report acute compression fractures at T12, L3 and L4 as well as facet arthropathy at the lower lumbar levels and at L3-4 moderate central stenosis. Patient underwent injection on 10/6 at L4-5 and L5-S1. PMH includes AAA, arthritis,  atherosclerotic heart disease, (s/p CABG), benign neoplasm of ascending/descending colon, COPD, CAD, dementia, dysrhythmia, frequent falls, GERD, hemorrhoid, HOH, HTN, PAF, prostate cancer. Monday through Friday have 3 people come in different times from 730 to 12pm. Uses a walker occasionally now    Limitations  Walking;Standing;House hold activities;Other (comment);Lifting    How long can you sit comfortably?  20 minutes before back is painful    How long can you stand comfortably?  hour    How long can you walk comfortably?  using a walker about 30 minutes    Patient Stated Goals  patient would like to improve his balance and strenght.    Currently in Pain?  No/denies             NuStep Level 3, seat 12, 4 min, >60 RPM for cardiovascular challenge and capacity for increased musculoskeletal challenge prior to exercise   // bars, CGA:  airex pad: one foot on 6" step one foot on airex pad, hold 30 seconds in modified tandem stance x 2 trials each LE placement   airex 6" step toe taps, slight SUE support, 2x 10 total, for increased balance and coordination  challenge, cueing for decreased speed for longer duration of single leg stance  airex pad: 6" step toe taps SUE support 10x each LE, cueing for upright posture  airex pad narrow BOS tic tac toe reaching outside BOS for marking x 2 minutes  Balloon taps reaching inside/outside BOS for stabilization and reaction timing. x3 minutes.   RTB hip extension 15x each LE   Seated: cues for body mechanics and sequencing RTB LAQ 15x each LE; alternating pattern  RTB ER/IR band around ankles, rainbow ball between knees 15x each LE, alternating  RTB adduction 12x each LE     Seated rest breaks to monitor oxygen and HR. Breathing technique reminders required.     Pt educated throughout session about proper posture and technique with exercises. Improved exercise technique, movement at target joints, use of target muscles after min to mod verbal, visual, tactile cues.              PT Education - 10/31/19 1020    Education provided  Yes    Education Details  exercise technique, body mechanics    Person(s) Educated  Patient    Methods  Explanation;Demonstration;Tactile cues;Verbal cues    Comprehension  Verbalized understanding;Returned demonstration;Verbal cues required;Tactile cues required       PT Short Term Goals - 10/03/19 1312      PT SHORT TERM GOAL #1   Title  Patient will be independent in home exercise program to improve strength/mobility for better functional independence with ADLs.    Baseline  10/16: HEP given 11/17: HEP compliant    Time  4    Period  Weeks    Status  Achieved    Target Date  07/15/19      PT SHORT TERM GOAL #2   Title  Patient will deny any falls over past 4 weeks to demonstrate improved safety awareness at home and work.    Baseline  10/16: one major fall 11/17: no falls    Time  4    Period  Weeks    Status  Achieved    Target Date  07/15/19        PT Long Term Goals - 10/03/19 1423      PT LONG TERM GOAL #1   Title  Patient will  increase BLE gross strength to 4+/5  as to improve functional strength for independent gait, increased standing tolerance and increased ADL ability.    Baseline  10/16: R gross 4-/5 with add/ext 3+/5 L grossly 3+/5 with hip add/ext 2+/5 11/17: R 4/5 add/ext; -/5 L 4-/5 hip add/ext 3/5 12/8: R: 4/5 L 4-/5 hip add/ext 3+/5 2/1: R 4+/5 L 4/5 with hip ext 4-/5    Time  8    Period  Weeks    Status  Partially Met    Target Date  11/28/19      PT LONG TERM GOAL #2   Title  Patient (> 59 years old) will complete five times sit to stand test in < 15 seconds indicating an increased LE strength and improved balance.    Baseline  10/16: 17 seconds with heavy use of hands on knees 11/17: 16 seconds one near LOB 12/8: 13 seconds one posterior LOB    Time  8    Period  Weeks    Status  Achieved      PT LONG TERM GOAL #3   Title  Patient will increase Berg Balance score by > 6 points (43/56)  to demonstrate decreased fall risk during functional activities.    Baseline  10/16: 37/56 11/17: 42/56 12/8: 47/56    Time  8    Period  Weeks    Status  Achieved      PT LONG TERM GOAL #4   Title  Patient will increase 10 meter walk test to >1.98ms as to improve gait speed for better community ambulation and to reduce fall risk.    Baseline  10/16: 0.91 w/o AD 11/17: 1.43 m/s with no AD    Time  8    Period  Weeks    Status  Achieved      PT LONG TERM GOAL #5   Title  Patient will increase ABC scale score >80% to demonstrate better functional mobility and better confidence with ADLs.    Baseline  10/16; 71% 11/17: 80% 12/8: 83%    Time  8    Period  Weeks    Status  Achieved      PT LONG TERM GOAL #6   Title  Patient will improve score on DGI to > or = 19/24 on the DGI to increase dynamic balance during mobility and to decrease risk of falls.    Baseline  12/8: perform next session 12/29: 18/24 2/1: 18/24    Time  8    Period  Weeks    Status  On-going    Target Date  11/28/19      PT LONG TERM GOAL  #7   Title  Patient will increase Berg Balance score by > 6 points (53/56)  to demonstrate decreased fall risk during functional activities.    Baseline  12/8; 47/56 12/29: 49/56 2/1: 46/56    Time  8    Period  Weeks    Status  On-going    Target Date  11/28/19            Plan - 10/31/19 1049    Clinical Impression Statement  Patient presents to physical therapy with good motivation. He is challenged with stability on unstable surfaces. Is harder of hearing this session requiring more frequent repetition of verbal cueing with increased visual cue. Spatial awareness and placement of LE's and UE's is improving with increased coordination and reactions. Patient would benefit from further skilled PT intervention to increase strength and stability for safer ADL performance  and increased functional ability.    Personal Factors and Comorbidities  Age;Comorbidity 3+;Fitness;Past/Current Experience;Sex;Social Background;Time since onset of injury/illness/exacerbation;Transportation    Comorbidities  AAA, arthritis, atherosclerotic heart disease, (s/p CABG), benign neoplasm of ascending/descending colon, COPD, CAD, dementia, dysrhythmia, frequent falls, GERD, hemorrhoid, HOH, HTN, PAF, prostate cancer.    Examination-Activity Limitations  Bathing;Bend;Carry;Dressing;Continence;Stairs;Squat;Reach Overhead;Locomotion Level;Lift;Stand;Transfers;Toileting    Examination-Participation Restrictions  Church;Cleaning;Community Activity;Driving;Interpersonal Relationship;Laundry;Volunteer;Shop;Personal Finances;Meal Prep;Yard Work    [employer] Potential  Fair    Clinical Impairments Affecting Rehab Potential  (+) good support system, high prior level of function (-) currently undergoing radiation therapy for prostate cancer, memory deficits     PT Frequency  2x / week    PT Duration  8 weeks    PT Treatment/Interventions  ADLs/Self Care Home Management;Aquatic Therapy;Cryotherapy;Moist  Heat;Traction;Ultrasound;Therapeutic activities;Functional mobility training;Stair training;Gait training;DME Instruction;Therapeutic exercise;Balance training;Neuromuscular re-education;Patient/family education;Manual techniques;Passive range of motion;Energy conservation;Vestibular;Taping    PT Next Visit Plan  progress dynamic stability    PT Home Exercise Plan  see above    Consulted and Agree with Plan of Care  Patient       Patient will benefit from skilled therapeutic intervention in order to improve the following deficits and impairments:  Abnormal gait, Cardiopulmonary status limiting activity, Decreased activity tolerance, Decreased balance, Decreased knowledge of precautions, Decreased endurance, Decreased coordination, Decreased cognition, Decreased knowledge of use of DME, Decreased mobility, Difficulty walking, Decreased safety awareness, Decreased strength, Impaired flexibility, Impaired perceived functional ability, Impaired sensation, Postural dysfunction, Improper body mechanics, Pain  Visit Diagnosis: Unsteadiness on feet  Muscle weakness (generalized)  Other abnormalities of gait and mobility     Problem List Patient Active Problem List   Diagnosis Date Noted  . Status post abdominal aortic aneurysm (AAA) repair 04/15/2019  . Candidiasis 02/22/2019  . Malignant neoplasm of descending colon ([place])   . Benign neoplasm of descending colon   . Benign neoplasm of transverse colon   . Do not intubate, cardiopulmonary resuscitation (CPR)-only code status 10/08/2018  . Prostate cancer ([place]) 02/25/2018  . Prostatic cyst 01/15/2018  . 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 ([place]) 09/15/2017  . Blood in stool   . Abnormal feces   . Stricture and stenosis of esophagus   . Mild cognitive impairment 08/18/2017  . Senile purpura ([place]) 04/29/2017  . Anemia 04/29/2017  . Frequent falls 04/29/2017  . Acute respiratory failure ([place]) 12/17/2016  . Simple chronic bronchitis ([place])  12/17/2016  . Benign localized hyperplasia of prostate with urinary obstruction 07/15/2016  . Elevated PSA 07/15/2016  . Incomplete emptying of bladder 07/15/2016  . Urge incontinence 07/15/2016  . Hypothyroidism 04/25/2016  . Macular degeneration 04/25/2016  . Erectile dysfunction due to arterial insufficiency   . Ischemic heart disease with chronotropic incompetence   . CN (constipation) 02/10/2015  . DD (diverticular disease) 02/10/2015  . Fatigue 02/10/2015  . Lichen planus 32/20/2542  . Restless leg 02/10/2015  . 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 B12 deficiency 02/10/2015  . COPD (chronic obstructive pulmonary disease) ([place]) 11/14/2014  . Post Inflammatory Lung Changes 11/14/2014  . PAF (paroxysmal atrial fibrillation) ([facility]) CHA2DS2-VASc = 4. AC = Eliquis   . Insomnia 12/09/2013  . OSA on CPAP   . Dyspnea on exertion - -essentially resolved with BB dose reduction & wgt loss 03/29/2013  . Overweight (BMI 25.0-29.9) -- Wgt back up 01/17/2013  . Atherosclerotic heart disease of native coronary artery without angina pectoris - s/p CABG, occluded SVG-D1; patent LIMA-LAD, SVG-OM, SVG- RCA   . Essential hypertension   .  Hyperlipidemia with target LDL less than 70   . Aorto-iliac disease ([place])   . BCC (basal cell carcinoma), eyelid 01/03/2013  . Allergic rhinitis 08/17/2009  . Adaptation reaction 05/28/2009  . Acid reflux 05/28/2009  . Arthritis, degenerative 05/28/2009  . 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 Carotid artery occlusion without infarction 06/01/2006  . PAD (peripheral artery disease) - bilateral common iliac stents 12/15/2005  . Aortoiliac occlusive disease ([place]) 11/30/2005  . Atherosclerotic heart disease of artery bypass graft 09/01/1997   Janna Arch, PT, DPT   10/31/2019, 10:59 AM  [place] MAIN [facility] SERVICES [address] [place], [place], [address] Phone: [phone]   Fax:  [phone]  Name:  [name] MRN: [id number] Date of Birth: 03/04/1934

## 2019-11-02 ENCOUNTER — Ambulatory Visit: Payer: Medicare Other

## 2019-11-02 ENCOUNTER — Other Ambulatory Visit: Payer: Self-pay | Admitting: Family Medicine

## 2019-11-02 ENCOUNTER — Other Ambulatory Visit: Payer: Self-pay

## 2019-11-02 DIAGNOSIS — R2681 Unsteadiness on feet: Secondary | ICD-10-CM | POA: Diagnosis not present

## 2019-11-02 DIAGNOSIS — M6281 Muscle weakness (generalized): Secondary | ICD-10-CM

## 2019-11-02 DIAGNOSIS — R2689 Other abnormalities of gait and mobility: Secondary | ICD-10-CM

## 2019-11-02 DIAGNOSIS — K219 Gastro-esophageal reflux disease without esophagitis: Secondary | ICD-10-CM

## 2019-11-02 NOTE — Therapy (Signed)
[place] MAIN [facility] SERVICES [address] [place], [place], [address] Phone: [phone]   Fax:  [phone]  Physical Therapy Treatment  Patient Details  Name: [name] MRN: [id number] Date of Birth: 1933/09/22 Referring Provider (PT): Eulogio Ditch   Encounter Date: 11/02/2019  PT End of Session - 11/02/19 1046    Visit Number  37    Number of Visits  45    Date for PT Re-Evaluation  11/28/19    Authorization Type  7/10 PN start 2/5    PT Start Time  1017    PT Stop Time  1100    PT Time Calculation (min)  43 min    Equipment Utilized During Treatment  Gait belt    Activity Tolerance  Patient tolerated treatment well    Behavior During Therapy  WFL for tasks assessed/performed       Past Medical History:  Diagnosis Date  . AAA (abdominal aortic aneurysm) ([place])   . Arthritis   . Atherosclerotic heart disease of native coronary artery without angina pectoris 1998   - s/p CABG, occluded SVG-D1; patent LIMA-LAD, SVG-OM, SVG- RCA  . Basal cell carcinoma of eyelid 01/03/2013   2019 - R side of Nose  . Benign neoplasm of ascending colon   . Benign neoplasm of descending colon   . Cancer [facility])    Skin cancer  . Colon polyps   . COPD (chronic obstructive pulmonary disease) ([place])   . Coronary artery disease   . Dementia ([place])   . Dysrhythmia    atrial fib  . Falls frequently   . GERD (gastroesophageal reflux disease)   . Hemorrhoid 02/10/2015  . History of hiatal hernia   . History of kidney stones   . HOH (hard of hearing)   . Hypertension   . Memory loss   . Osteoporosis   . PAF (paroxysmal atrial fibrillation) ([place])   . Prostate cancer ([place])   . Prostate disorder   . Sciatic pain    Chronic  . Sleep apnea    cpap  . Temporary cerebral vascular dysfunction 02/10/2015   Had negative work-up.  July, 2012.  No medication changes, had forgot them that day, got dehydrated.   . Urinary incontinence     Past Surgical  History:  Procedure Laterality Date  . ABDOMINAL AORTIC ENDOVASCULAR STENT GRAFT Bilateral 05/18/2019   Procedure: ABDOMINAL AORTIC ENDOVASCULAR STENT GRAFT;  Surgeon: Algernon Huxley, MD;  Location: [facility] ORS;  Service: Vascular;  Laterality: Bilateral;  . ANKLE SURGERY    . Arch aortogram and carotid aortogram  06/02/2006   Dr Oneida Alar did surgery  . BASAL CELL CARCINOMA EXCISION  04/2016   Dermatology  . CARDIAC CATHETERIZATION  01/30/1998    (Dr. Linard Millers): Native LAD & mRCA CTO. 90% D1. Mod Cx. SVG-D1 CTO. SVG-RCA & SVG-OM along with LIMA-LAD patent;  LV NORMAL Fxn  . CAROTID ENDARTERECTOMY Left Oct. 29, 2007   Dr. Oneida Alar:  . CATARACT EXTRACTION W/PHACO Right 03/05/2017   Procedure: CATARACT EXTRACTION PHACO AND INTRAOCULAR LENS PLACEMENT (IOC);  Surgeon: Leandrew Koyanagi, MD;  Location: [facility] ORS;  Service: Ophthalmology;  Laterality: Right;  [place] 00:58.5 AP% 10.6 CDE 6.20 Fluid Pack lot # [id number]  . CATARACT EXTRACTION W/PHACO Left 04/15/2017   Procedure: CATARACT EXTRACTION PHACO AND INTRAOCULAR LENS PLACEMENT ([place])  Left;  Surgeon: Leandrew Koyanagi, MD;  Location: [place];  Service: Ophthalmology;  Laterality: Left;  . COLONOSCOPY WITH PROPOFOL N/A 09/15/2017  Procedure: COLONOSCOPY WITH PROPOFOL;  Surgeon: Lucilla Lame, MD;  Location: [facility] ENDOSCOPY;  Service: Endoscopy;  Laterality: N/A;  . COLONOSCOPY WITH PROPOFOL N/A 10/26/2018   Procedure: COLONOSCOPY WITH PROPOFOL;  Surgeon: Lucilla Lame, MD;  Location: [facility] ENDOSCOPY;  Service: Endoscopy;  Laterality: N/A;  . CORONARY ARTERY BYPASS GRAFT  1998   LIMA-LAD, SVG-OM, SVG-RPDA, SVG-DIAG  . CPET / MET  12/2012   Mild chronotropic incompetence - read 82% of predicted; also reduced effort; peak VO2 15.7 / 75% (did not reach Max effort) -- suggested ischemic response in last 1.5 minutes of exercise. Normal pulmonary function on PFTs but poor response to  . DOPPLER ECHOCARDIOGRAPHY  11/20/2010   EF =>55%; LV norm mild aortic  scelorosis  . ENDOVASCULAR REPAIR/STENT GRAFT N/A 05/18/2019   Procedure: ENDOVASCULAR REPAIR/STENT GRAFT;  Surgeon: Algernon Huxley, MD;  Location: [place] CV LAB;  Service: Cardiovascular;  Laterality: N/A;  . ESOPHAGOGASTRODUODENOSCOPY (EGD) WITH PROPOFOL N/A 09/15/2017   Procedure: ESOPHAGOGASTRODUODENOSCOPY (EGD) WITH PROPOFOL;  Surgeon: Lucilla Lame, MD;  Location: [facility] ENDOSCOPY;  Service: Endoscopy;  Laterality: N/A;  . FRACTURE SURGERY Right 03/19/2015   wrist  . HIATAL HERNIA REPAIR    . ILIAC ARTERY STENT  12/15/2005   PTA and direct stenting rgt and lft common iliac arteries  . KYPHOPLASTY N/A 04/05/2019   Procedure: KYPHOPLASTY T12, L3, L4 L5;  Surgeon: Hessie Knows, MD;  Location: [facility] ORS;  Service: Orthopedics;  Laterality: N/A;  . NM GATED MYOVIEW ([place] [facility])  12/2017   Low Risk - no ischemia or infarct.  EF > 65%. no RWMA  . NM MYOVIEW ([place] [facility])  11/11/2010; February 2016   a) TM STRESS----NORMAL PERFUSION ,EF 68%; b) EF 50-55%. Normal LV function. No significant ischemia or infarction.  . OPEN REDUCTION INTERNAL FIXATION (ORIF) DISTAL RADIAL FRACTURE Left 01/13/2019   Procedure: OPEN REDUCTION INTERNAL FIXATION (ORIF) DISTAL  RADIAL FRACTURE - LEFT - SLEEP APNEA;  Surgeon: Hessie Knows, MD;  Location: [facility] ORS;  Service: Orthopedics;  Laterality: Left;  . ORIF ELBOW FRACTURE Right 01/01/2017   Procedure: OPEN REDUCTION INTERNAL FIXATION (ORIF) ELBOW/OLECRANON FRACTURE;  Surgeon: Hessie Knows, MD;  Location: [facility] ORS;  Service: Orthopedics;  Laterality: Right;  . ORIF WRIST FRACTURE Right 03/19/2015   Procedure: OPEN REDUCTION INTERNAL FIXATION (ORIF) WRIST FRACTURE;  Surgeon: Hessie Knows, MD;  Location: [facility] ORS;  Service: Orthopedics;  Laterality: Right;  . THORACIC AORTA - CAROTID ANGIOGRAM  October 2007   Dr. 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 TRANSTHORACIC ECHOCARDIOGRAM  February  2016   [facility]: Normal LV function. Dilated left atrium.    There were no vitals filed for this visit.  Subjective Assessment - 11/02/19 1045    Subjective  Patient reports no falls or LOB since last session. Has not been able to walk outside lately due to not having anyone to walk with him.    Pertinent History  Patient referred to physical therapy for balance/strength issues s/p abdominal aortic aneurysm (AAA) without rupture repair on 05/18/19 .  Patient has been seen by this therapist in the past (last seen in march of 2020) Patient had a fall on 01/08/19, MRI from [facility] dated 03/28/19 report acute compression fractures at T12, L3 and L4 as well as facet arthropathy at the lower lumbar levels and at L3-4 moderate central stenosis. Patient underwent injection on 10/6 at L4-5 and L5-S1. PMH includes AAA, arthritis, atherosclerotic heart disease, (  s/p CABG), benign neoplasm of ascending/descending colon, COPD, CAD, dementia, dysrhythmia, frequent falls, GERD, hemorrhoid, HOH, HTN, PAF, prostate cancer. Monday through Friday have 3 people come in different times from 730 to 12pm. Uses a walker occasionally now    Limitations  Walking;Standing;House hold activities;Other (comment);Lifting    How long can you sit comfortably?  20 minutes before back is painful    How long can you stand comfortably?  hour    How long can you walk comfortably?  using a walker about 30 minutes    Patient Stated Goals  patient would like to improve his balance and strenght.    Currently in Pain?  No/denies                Treatment  In // bars: CGA with cueing for sequencing and body mechanics  One foot airex pad one foot 4" step, static semi tandem stance 30 second holds each position airex balance beam side steps 4x length of / bars Balloon taps reaching inside/outside BOS 3 minutes Half foam roller df/pf with UE support 15x  RTB side stepping 4x length of // bars, cueing for foot clearance.  GTB hip extension  15x each LE BUE support  6" step up/down BUE support 15x each LE   weave between 5 cones x4 trials with occasional scissoring but no LOB; close CGA    seated GTB: cueing for body mechanics and sequencing for optimal muscle recruitment and strengthening.  Adduction 12x each LE against PT resistance Around ankles: ER/IR step out 12x each LE, one LE at a time Around ankles alternating LE kick outs 15x each LE     Seated rest breaks to monitor oxygen and HR. Breathing technique reminders required.   Pt educated throughout session about proper posture and technique with exercises. Improved exercise technique, movement at target joints, use of target muscles after min to mod verbal, visual, tactile cues.        PT Education - 11/02/19 1046    Education provided  Yes    Education Details  exercise technique, body mechanics    Person(s) Educated  Patient    Methods  Explanation;Demonstration;Tactile cues;Verbal cues    Comprehension  Verbalized understanding;Returned demonstration;Verbal cues required;Tactile cues required       PT Short Term Goals - 10/03/19 1312      PT SHORT TERM GOAL #1   Title  Patient will be independent in home exercise program to improve strength/mobility for better functional independence with ADLs.    Baseline  10/16: HEP given 11/17: HEP compliant    Time  4    Period  Weeks    Status  Achieved    Target Date  07/15/19      PT SHORT TERM GOAL #2   Title  Patient will deny any falls over past 4 weeks to demonstrate improved safety awareness at home and work.    Baseline  10/16: one major fall 11/17: no falls    Time  4    Period  Weeks    Status  Achieved    Target Date  07/15/19        PT Long Term Goals - 10/03/19 1423      PT LONG TERM GOAL #1   Title  Patient will increase BLE gross strength to 4+/5 as to improve functional strength for independent gait, increased standing tolerance and increased ADL ability.    Baseline  10/16: R gross  4-/5 with add/ext 3+/5 L grossly  3+/5 with hip add/ext 2+/5 11/17: R 4/5 add/ext; -/5 L 4-/5 hip add/ext 3/5 12/8: R: 4/5 L 4-/5 hip add/ext 3+/5 2/1: R 4+/5 L 4/5 with hip ext 4-/5    Time  8    Period  Weeks    Status  Partially Met    Target Date  11/28/19      PT LONG TERM GOAL #2   Title  Patient (> 62 years old) will complete five times sit to stand test in < 15 seconds indicating an increased LE strength and improved balance.    Baseline  10/16: 17 seconds with heavy use of hands on knees 11/17: 16 seconds one near LOB 12/8: 13 seconds one posterior LOB    Time  8    Period  Weeks    Status  Achieved      PT LONG TERM GOAL #3   Title  Patient will increase Berg Balance score by > 6 points (43/56)  to demonstrate decreased fall risk during functional activities.    Baseline  10/16: 37/56 11/17: 42/56 12/8: 47/56    Time  8    Period  Weeks    Status  Achieved      PT LONG TERM GOAL #4   Title  Patient will increase 10 meter walk test to >1.64ms as to improve gait speed for better community ambulation and to reduce fall risk.    Baseline  10/16: 0.91 w/o AD 11/17: 1.43 m/s with no AD    Time  8    Period  Weeks    Status  Achieved      PT LONG TERM GOAL #5   Title  Patient will increase ABC scale score >80% to demonstrate better functional mobility and better confidence with ADLs.    Baseline  10/16; 71% 11/17: 80% 12/8: 83%    Time  8    Period  Weeks    Status  Achieved      PT LONG TERM GOAL #6   Title  Patient will improve score on DGI to > or = 19/24 on the DGI to increase dynamic balance during mobility and to decrease risk of falls.    Baseline  12/8: perform next session 12/29: 18/24 2/1: 18/24    Time  8    Period  Weeks    Status  On-going    Target Date  11/28/19      PT LONG TERM GOAL #7   Title  Patient will increase Berg Balance score by > 6 points (53/56)  to demonstrate decreased fall risk during functional activities.    Baseline  12/8; 47/56 12/29:  49/56 2/1: 46/56    Time  8    Period  Weeks    Status  On-going    Target Date  11/28/19            Plan - 11/02/19 1218    Clinical Impression Statement  Patient presents to physical therapy with excellent motivation. Negotiation of obstacles has improved with decreased episodes of LOB and improved clearance with no cones knocked over this session. Prolonged muscle recruitment fatigues patient quickly resulting in need for occasional rest breaks. Patient would benefit from further skilled PT intervention to increase strength and stability for safer ADL performance and increased functional ability.    Personal Factors and Comorbidities  Age;Comorbidity 3+;Fitness;Past/Current Experience;Sex;Social Background;Time since onset of injury/illness/exacerbation;Transportation    Comorbidities  AAA, arthritis, atherosclerotic heart disease, (s/p CABG), benign neoplasm of ascending/descending  colon, COPD, CAD, dementia, dysrhythmia, frequent falls, GERD, hemorrhoid, HOH, HTN, PAF, prostate cancer.    Examination-Activity Limitations  Bathing;Bend;Carry;Dressing;Continence;Stairs;Squat;Reach Overhead;Locomotion Level;Lift;Stand;Transfers;Toileting    Examination-Participation Restrictions  Church;Cleaning;Community Activity;Driving;Interpersonal Relationship;Laundry;Volunteer;Shop;Personal Finances;Meal Prep;Yard Work    [employer] Potential  Fair    Clinical Impairments Affecting Rehab Potential  (+) good support system, high prior level of function (-) currently undergoing radiation therapy for prostate cancer, memory deficits     PT Frequency  2x / week    PT Duration  8 weeks    PT Treatment/Interventions  ADLs/Self Care Home Management;Aquatic Therapy;Cryotherapy;Moist Heat;Traction;Ultrasound;Therapeutic activities;Functional mobility training;Stair training;Gait training;DME Instruction;Therapeutic exercise;Balance training;Neuromuscular re-education;Patient/family education;Manual  techniques;Passive range of motion;Energy conservation;Vestibular;Taping    PT Next Visit Plan  progress dynamic stability    PT Home Exercise Plan  see above    Consulted and Agree with Plan of Care  Patient       Patient will benefit from skilled therapeutic intervention in order to improve the following deficits and impairments:  Abnormal gait, Cardiopulmonary status limiting activity, Decreased activity tolerance, Decreased balance, Decreased knowledge of precautions, Decreased endurance, Decreased coordination, Decreased cognition, Decreased knowledge of use of DME, Decreased mobility, Difficulty walking, Decreased safety awareness, Decreased strength, Impaired flexibility, Impaired perceived functional ability, Impaired sensation, Postural dysfunction, Improper body mechanics, Pain  Visit Diagnosis: Unsteadiness on feet  Muscle weakness (generalized)  Other abnormalities of gait and mobility     Problem List Patient Active Problem List   Diagnosis Date Noted  . Status post abdominal aortic aneurysm (AAA) repair 04/15/2019  . Candidiasis 02/22/2019  . Malignant neoplasm of descending colon ([place])   . Benign neoplasm of descending colon   . Benign neoplasm of transverse colon   . Do not intubate, cardiopulmonary resuscitation (CPR)-only code status 10/08/2018  . Prostate cancer ([place]) 02/25/2018  . Prostatic cyst 01/15/2018  . 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 ([place]) 09/15/2017  . Blood in stool   . Abnormal feces   . Stricture and stenosis of esophagus   . Mild cognitive impairment 08/18/2017  . Senile purpura ([place]) 04/29/2017  . Anemia 04/29/2017  . Frequent falls 04/29/2017  . Acute respiratory failure ([place]) 12/17/2016  . Simple chronic bronchitis ([place]) 12/17/2016  . Benign localized hyperplasia of prostate with urinary obstruction 07/15/2016  . Elevated PSA 07/15/2016  . Incomplete emptying of bladder 07/15/2016  . Urge incontinence 07/15/2016  . Hypothyroidism  04/25/2016  . Macular degeneration 04/25/2016  . Erectile dysfunction due to arterial insufficiency   . Ischemic heart disease with chronotropic incompetence   . CN (constipation) 02/10/2015  . DD (diverticular disease) 02/10/2015  . Fatigue 02/10/2015  . Lichen planus 22/97/9892  . Restless leg 02/10/2015  . 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 B12 deficiency 02/10/2015  . COPD (chronic obstructive pulmonary disease) ([place]) 11/14/2014  . Post Inflammatory Lung Changes 11/14/2014  . PAF (paroxysmal atrial fibrillation) ([facility]) CHA2DS2-VASc = 4. AC = Eliquis   . Insomnia 12/09/2013  . OSA on CPAP   . Dyspnea on exertion - -essentially resolved with BB dose reduction & wgt loss 03/29/2013  . Overweight (BMI 25.0-29.9) -- Wgt back up 01/17/2013  . Atherosclerotic heart disease of native coronary artery without angina pectoris - s/p CABG, occluded SVG-D1; patent LIMA-LAD, SVG-OM, SVG- RCA   . Essential hypertension   . Hyperlipidemia with target LDL less than 70   . Aorto-iliac disease ([place])   . BCC (basal cell carcinoma), eyelid 01/03/2013  . Allergic rhinitis 08/17/2009  . Adaptation reaction 05/28/2009  . Acid  reflux 05/28/2009  . Arthritis, degenerative 05/28/2009  . 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 Carotid artery occlusion without infarction 06/01/2006  . PAD (peripheral artery disease) - bilateral common iliac stents 12/15/2005  . Aortoiliac occlusive disease ([place]) 11/30/2005  . Atherosclerotic heart disease of artery bypass graft 09/01/1997   Janna Arch, PT, DPT   11/02/2019, 12:20 PM  [place] MAIN [facility] SERVICES [address] [place], [place], [address] Phone: [phone]   Fax:  [phone]  Name: [name] MRN: [id number] Date of Birth: 08-Jul-1934

## 2019-11-07 ENCOUNTER — Other Ambulatory Visit: Payer: Self-pay

## 2019-11-07 ENCOUNTER — Ambulatory Visit: Payer: Medicare Other

## 2019-11-07 DIAGNOSIS — R2681 Unsteadiness on feet: Secondary | ICD-10-CM

## 2019-11-07 DIAGNOSIS — M6281 Muscle weakness (generalized): Secondary | ICD-10-CM

## 2019-11-07 DIAGNOSIS — R2689 Other abnormalities of gait and mobility: Secondary | ICD-10-CM

## 2019-11-07 NOTE — Therapy (Signed)
[place] MAIN [facility] SERVICES [address] [place], [place], [address] Phone: [phone]   Fax:  [phone]  Physical Therapy Treatment  Patient Details  Name: [name] MRN: [id number] Date of Birth: 1934/03/23 Referring Provider (PT): Eulogio Ditch   Encounter Date: 11/07/2019  PT End of Session - 11/07/19 1020    Visit Number  38    Number of Visits  45    Date for PT Re-Evaluation  11/28/19    Authorization Type  8/10 PN start 2/5    PT Start Time  1015    PT Stop Time  1059    PT Time Calculation (min)  44 min    Equipment Utilized During Treatment  Gait belt    Activity Tolerance  Patient tolerated treatment well    Behavior During Therapy  WFL for tasks assessed/performed       Past Medical History:  Diagnosis Date  . AAA (abdominal aortic aneurysm) ([place])   . Arthritis   . Atherosclerotic heart disease of native coronary artery without angina pectoris 1998   - s/p CABG, occluded SVG-D1; patent LIMA-LAD, SVG-OM, SVG- RCA  . Basal cell carcinoma of eyelid 01/03/2013   2019 - R side of Nose  . Benign neoplasm of ascending colon   . Benign neoplasm of descending colon   . Cancer [facility])    Skin cancer  . Colon polyps   . COPD (chronic obstructive pulmonary disease) ([place])   . Coronary artery disease   . Dementia ([place])   . Dysrhythmia    atrial fib  . Falls frequently   . GERD (gastroesophageal reflux disease)   . Hemorrhoid 02/10/2015  . History of hiatal hernia   . History of kidney stones   . HOH (hard of hearing)   . Hypertension   . Memory loss   . Osteoporosis   . PAF (paroxysmal atrial fibrillation) ([place])   . Prostate cancer ([place])   . Prostate disorder   . Sciatic pain    Chronic  . Sleep apnea    cpap  . Temporary cerebral vascular dysfunction 02/10/2015   Had negative work-up.  July, 2012.  No medication changes, had forgot them that day, got dehydrated.   . Urinary incontinence     Past Surgical  History:  Procedure Laterality Date  . ABDOMINAL AORTIC ENDOVASCULAR STENT GRAFT Bilateral 05/18/2019   Procedure: ABDOMINAL AORTIC ENDOVASCULAR STENT GRAFT;  Surgeon: Algernon Huxley, MD;  Location: [facility] ORS;  Service: Vascular;  Laterality: Bilateral;  . ANKLE SURGERY    . Arch aortogram and carotid aortogram  06/02/2006   Dr Oneida Alar did surgery  . BASAL CELL CARCINOMA EXCISION  04/2016   Dermatology  . CARDIAC CATHETERIZATION  01/30/1998    (Dr. Linard Millers): Native LAD & mRCA CTO. 90% D1. Mod Cx. SVG-D1 CTO. SVG-RCA & SVG-OM along with LIMA-LAD patent;  LV NORMAL Fxn  . CAROTID ENDARTERECTOMY Left Oct. 29, 2007   Dr. Oneida Alar:  . CATARACT EXTRACTION W/PHACO Right 03/05/2017   Procedure: CATARACT EXTRACTION PHACO AND INTRAOCULAR LENS PLACEMENT (IOC);  Surgeon: Leandrew Koyanagi, MD;  Location: [facility] ORS;  Service: Ophthalmology;  Laterality: Right;  [place] 00:58.5 AP% 10.6 CDE 6.20 Fluid Pack lot # [id number]  . CATARACT EXTRACTION W/PHACO Left 04/15/2017   Procedure: CATARACT EXTRACTION PHACO AND INTRAOCULAR LENS PLACEMENT ([place])  Left;  Surgeon: Leandrew Koyanagi, MD;  Location: [place];  Service: Ophthalmology;  Laterality: Left;  . COLONOSCOPY WITH PROPOFOL N/A 09/15/2017  Procedure: COLONOSCOPY WITH PROPOFOL;  Surgeon: Lucilla Lame, MD;  Location: [facility] ENDOSCOPY;  Service: Endoscopy;  Laterality: N/A;  . COLONOSCOPY WITH PROPOFOL N/A 10/26/2018   Procedure: COLONOSCOPY WITH PROPOFOL;  Surgeon: Lucilla Lame, MD;  Location: [facility] ENDOSCOPY;  Service: Endoscopy;  Laterality: N/A;  . CORONARY ARTERY BYPASS GRAFT  1998   LIMA-LAD, SVG-OM, SVG-RPDA, SVG-DIAG  . CPET / MET  12/2012   Mild chronotropic incompetence - read 82% of predicted; also reduced effort; peak VO2 15.7 / 75% (did not reach Max effort) -- suggested ischemic response in last 1.5 minutes of exercise. Normal pulmonary function on PFTs but poor response to  . DOPPLER ECHOCARDIOGRAPHY  11/20/2010   EF =>55%; LV norm mild aortic  scelorosis  . ENDOVASCULAR REPAIR/STENT GRAFT N/A 05/18/2019   Procedure: ENDOVASCULAR REPAIR/STENT GRAFT;  Surgeon: Algernon Huxley, MD;  Location: [facility] CV LAB;  Service: Cardiovascular;  Laterality: N/A;  . ESOPHAGOGASTRODUODENOSCOPY (EGD) WITH PROPOFOL N/A 09/15/2017   Procedure: ESOPHAGOGASTRODUODENOSCOPY (EGD) WITH PROPOFOL;  Surgeon: Lucilla Lame, MD;  Location: [facility] ENDOSCOPY;  Service: Endoscopy;  Laterality: N/A;  . FRACTURE SURGERY Right 03/19/2015   wrist  . HIATAL HERNIA REPAIR    . ILIAC ARTERY STENT  12/15/2005   PTA and direct stenting rgt and lft common iliac arteries  . KYPHOPLASTY N/A 04/05/2019   Procedure: KYPHOPLASTY T12, L3, L4 L5;  Surgeon: Hessie Knows, MD;  Location: [facility] ORS;  Service: Orthopedics;  Laterality: N/A;  . NM GATED MYOVIEW ([place] [facility])  12/2017   Low Risk - no ischemia or infarct.  EF > 65%. no RWMA  . NM MYOVIEW ([place] [facility])  11/11/2010; February 2016   a) TM STRESS----NORMAL PERFUSION ,EF 68%; b) EF 50-55%. Normal LV function. No significant ischemia or infarction.  . OPEN REDUCTION INTERNAL FIXATION (ORIF) DISTAL RADIAL FRACTURE Left 01/13/2019   Procedure: OPEN REDUCTION INTERNAL FIXATION (ORIF) DISTAL  RADIAL FRACTURE - LEFT - SLEEP APNEA;  Surgeon: Hessie Knows, MD;  Location: [facility] ORS;  Service: Orthopedics;  Laterality: Left;  . ORIF ELBOW FRACTURE Right 01/01/2017   Procedure: OPEN REDUCTION INTERNAL FIXATION (ORIF) ELBOW/OLECRANON FRACTURE;  Surgeon: Hessie Knows, MD;  Location: [facility] ORS;  Service: Orthopedics;  Laterality: Right;  . ORIF WRIST FRACTURE Right 03/19/2015   Procedure: OPEN REDUCTION INTERNAL FIXATION (ORIF) WRIST FRACTURE;  Surgeon: Hessie Knows, MD;  Location: [facility] ORS;  Service: Orthopedics;  Laterality: Right;  . THORACIC AORTA - CAROTID ANGIOGRAM  October 2007   Dr. 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 TRANSTHORACIC ECHOCARDIOGRAM  February  2016   [facility]: Normal LV function. Dilated left atrium.    There were no vitals filed for this visit.  Subjective Assessment - 11/07/19 1019    Subjective  Patient reports no pain today. Reports no falls or LOB since last session. Reports he wasn't able to go walking since his last visit.    Pertinent History  Patient referred to physical therapy for balance/strength issues s/p abdominal aortic aneurysm (AAA) without rupture repair on 05/18/19 .  Patient has been seen by this therapist in the past (last seen in march of 2020) Patient had a fall on 01/08/19, MRI [facility] dated 03/28/19 report acute compression fractures at T12, L3 and L4 as well as facet arthropathy at the lower lumbar levels and at L3-4 moderate central stenosis. Patient underwent injection on 10/6 at L4-5 and L5-S1. PMH includes AAA, arthritis, atherosclerotic heart disease, (s/p CABG),  benign neoplasm of ascending/descending colon, COPD, CAD, dementia, dysrhythmia, frequent falls, GERD, hemorrhoid, HOH, HTN, PAF, prostate cancer. Monday through Friday have 3 people come in different times from 730 to 12pm. Uses a walker occasionally now    Limitations  Walking;Standing;House hold activities;Other (comment);Lifting    How long can you sit comfortably?  20 minutes before back is painful    How long can you stand comfortably?  hour    How long can you walk comfortably?  using a walker about 30 minutes    Patient Stated Goals  patient would like to improve his balance and strenght.    Currently in Pain?  No/denies          Treatment  In // bars: CGA with cueing for sequencing and body mechanics  One foot airex pad one foot 6" step, static semi tandem stance 30 second holds each position; more challenging with LLE as stability leg.   Unstable surface: ambulate across red mat in // bars with close CGA, cueing for widening BOS and increasing foot clearance x 8 trials    6" step lateral step up/down, very challenging for spatial  awareness and foot positioning. X 10 each direction   6" step up/down BUE support 15x each LE  weave between 5 cones x4 trials with occasional scissoring but no LOB; close CGA   seated : cueing for body mechanics and sequencing for optimal muscle recruitment and strengthening.    Leg press:  Bilateral LE's #75; 12x  cueing for velocity of movement and foot placement for optimal muscle recruitment   Single LE Leg press: # 25 10x each LE, cueing to decrease velocity for improved control   Adduction 12x each LE RTB  against PT resistance  RTB Around ankles: ER/IR step out 12x each LE, one LE at a time  RTB Around ankles alternating LE kick outs 15x each LE   RTB hamstring curls 15x each LE  RTB pf resistance x 15 each LE      Seated rest breaks to monitor oxygen and HR. Breathing technique reminders required.    Pt educated throughout session about proper posture and technique with exercises. Improved exercise technique, movement at target joints, use of target muscles after min to mod verbal, visual, tactile cues.                      PT Education - 11/07/19 1019    Education provided  Yes    Education Details  exercise technique, body mechanics    Person(s) Educated  Patient    Methods  Explanation;Demonstration;Tactile cues;Verbal cues    Comprehension  Verbalized understanding;Returned demonstration;Verbal cues required;Tactile cues required       PT Short Term Goals - 10/03/19 1312      PT SHORT TERM GOAL #1   Title  Patient will be independent in home exercise program to improve strength/mobility for better functional independence with ADLs.    Baseline  10/16: HEP given 11/17: HEP compliant    Time  4    Period  Weeks    Status  Achieved    Target Date  07/15/19      PT SHORT TERM GOAL #2   Title  Patient will deny any falls over past 4 weeks to demonstrate improved safety awareness at home and work.    Baseline  10/16: one major fall 11/17: no  falls    Time  4    Period  Weeks  Status  Achieved    Target Date  07/15/19        PT Long Term Goals - 10/03/19 1423      PT LONG TERM GOAL #1   Title  Patient will increase BLE gross strength to 4+/5 as to improve functional strength for independent gait, increased standing tolerance and increased ADL ability.    Baseline  10/16: R gross 4-/5 with add/ext 3+/5 L grossly 3+/5 with hip add/ext 2+/5 11/17: R 4/5 add/ext; -/5 L 4-/5 hip add/ext 3/5 12/8: R: 4/5 L 4-/5 hip add/ext 3+/5 2/1: R 4+/5 L 4/5 with hip ext 4-/5    Time  8    Period  Weeks    Status  Partially Met    Target Date  11/28/19      PT LONG TERM GOAL #2   Title  Patient (> 60 years old) will complete five times sit to stand test in < 15 seconds indicating an increased LE strength and improved balance.    Baseline  10/16: 17 seconds with heavy use of hands on knees 11/17: 16 seconds one near LOB 12/8: 13 seconds one posterior LOB    Time  8    Period  Weeks    Status  Achieved      PT LONG TERM GOAL #3   Title  Patient will increase Berg Balance score by > 6 points (43/56)  to demonstrate decreased fall risk during functional activities.    Baseline  10/16: 37/56 11/17: 42/56 12/8: 47/56    Time  8    Period  Weeks    Status  Achieved      PT LONG TERM GOAL #4   Title  Patient will increase 10 meter walk test to >1.22ms as to improve gait speed for better community ambulation and to reduce fall risk.    Baseline  10/16: 0.91 w/o AD 11/17: 1.43 m/s with no AD    Time  8    Period  Weeks    Status  Achieved      PT LONG TERM GOAL #5   Title  Patient will increase ABC scale score >80% to demonstrate better functional mobility and better confidence with ADLs.    Baseline  10/16; 71% 11/17: 80% 12/8: 83%    Time  8    Period  Weeks    Status  Achieved      PT LONG TERM GOAL #6   Title  Patient will improve score on DGI to > or = 19/24 on the DGI to increase dynamic balance during mobility and to decrease  risk of falls.    Baseline  12/8: perform next session 12/29: 18/24 2/1: 18/24    Time  8    Period  Weeks    Status  On-going    Target Date  11/28/19      PT LONG TERM GOAL #7   Title  Patient will increase Berg Balance score by > 6 points (53/56)  to demonstrate decreased fall risk during functional activities.    Baseline  12/8; 47/56 12/29: 49/56 2/1: 46/56    Time  8    Period  Weeks    Status  On-going    Target Date  11/28/19            Plan - 11/07/19 1259    Clinical Impression Statement  Patient presents to physical therapy with excellent motivation despite fatigue. Frequent posterior trunk lean noted throughout session requiring cueing  and occasional Min A to stabilize self. Leg press challenging for patient at this time. Patient would benefit from further skilled PT intervention to increase strength and stability for safer ADL performance and increased functional ability.    Personal Factors and Comorbidities  Age;Comorbidity 3+;Fitness;Past/Current Experience;Sex;Social Background;Time since onset of injury/illness/exacerbation;Transportation    Comorbidities  AAA, arthritis, atherosclerotic heart disease, (s/p CABG), benign neoplasm of ascending/descending colon, COPD, CAD, dementia, dysrhythmia, frequent falls, GERD, hemorrhoid, HOH, HTN, PAF, prostate cancer.    Examination-Activity Limitations  Bathing;Bend;Carry;Dressing;Continence;Stairs;Squat;Reach Overhead;Locomotion Level;Lift;Stand;Transfers;Toileting    Examination-Participation Restrictions  Church;Cleaning;Community Activity;Driving;Interpersonal Relationship;Laundry;Volunteer;Shop;Personal Finances;Meal Prep;Yard Work    [employer] Potential  Fair    Clinical Impairments Affecting Rehab Potential  (+) good support system, high prior level of function (-) currently undergoing radiation therapy for prostate cancer, memory deficits     PT Frequency  2x / week    PT Duration  8 weeks    PT Treatment/Interventions   ADLs/Self Care Home Management;Aquatic Therapy;Cryotherapy;Moist Heat;Traction;Ultrasound;Therapeutic activities;Functional mobility training;Stair training;Gait training;DME Instruction;Therapeutic exercise;Balance training;Neuromuscular re-education;Patient/family education;Manual techniques;Passive range of motion;Energy conservation;Vestibular;Taping    PT Next Visit Plan  progress dynamic stability    PT Home Exercise Plan  see above    Consulted and Agree with Plan of Care  Patient       Patient will benefit from skilled therapeutic intervention in order to improve the following deficits and impairments:  Abnormal gait, Cardiopulmonary status limiting activity, Decreased activity tolerance, Decreased balance, Decreased knowledge of precautions, Decreased endurance, Decreased coordination, Decreased cognition, Decreased knowledge of use of DME, Decreased mobility, Difficulty walking, Decreased safety awareness, Decreased strength, Impaired flexibility, Impaired perceived functional ability, Impaired sensation, Postural dysfunction, Improper body mechanics, Pain  Visit Diagnosis: Unsteadiness on feet  Muscle weakness (generalized)  Other abnormalities of gait and mobility     Problem List Patient Active Problem List   Diagnosis Date Noted  . Status post abdominal aortic aneurysm (AAA) repair 04/15/2019  . Candidiasis 02/22/2019  . Malignant neoplasm of descending colon ([place])   . Benign neoplasm of descending colon   . Benign neoplasm of transverse colon   . Do not intubate, cardiopulmonary resuscitation (CPR)-only code status 10/08/2018  . Prostate cancer ([place]) 02/25/2018  . Prostatic cyst 01/15/2018  . 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 ([place]) 09/15/2017  . Blood in stool   . Abnormal feces   . Stricture and stenosis of esophagus   . Mild cognitive impairment 08/18/2017  . Senile purpura ([place]) 04/29/2017  . Anemia 04/29/2017  . Frequent falls 04/29/2017  . Acute respiratory  failure ([place]) 12/17/2016  . Simple chronic bronchitis ([place]) 12/17/2016  . Benign localized hyperplasia of prostate with urinary obstruction 07/15/2016  . Elevated PSA 07/15/2016  . Incomplete emptying of bladder 07/15/2016  . Urge incontinence 07/15/2016  . Hypothyroidism 04/25/2016  . Macular degeneration 04/25/2016  . Erectile dysfunction due to arterial insufficiency   . Ischemic heart disease with chronotropic incompetence   . CN (constipation) 02/10/2015  . DD (diverticular disease) 02/10/2015  . Fatigue 02/10/2015  . Lichen planus 62/[id number]/7654  . Restless leg 02/10/2015  . 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 B12 deficiency 02/10/2015  . COPD (chronic obstructive pulmonary disease) ([place]) 11/14/2014  . Post Inflammatory Lung Changes 11/14/2014  . PAF (paroxysmal atrial fibrillation) ([facility]) CHA2DS2-VASc = 4. AC = Eliquis   . Insomnia 12/09/2013  . OSA on CPAP   . Dyspnea on exertion - -essentially resolved with BB dose reduction & wgt loss 03/29/2013  . Overweight (BMI 25.0-29.9) -- Wgt  back up 01/17/2013  . Atherosclerotic heart disease of native coronary artery without angina pectoris - s/p CABG, occluded SVG-D1; patent LIMA-LAD, SVG-OM, SVG- RCA   . Essential hypertension   . Hyperlipidemia with target LDL less than 70   . Aorto-iliac disease ([place])   . BCC (basal cell carcinoma), eyelid 01/03/2013  . Allergic rhinitis 08/17/2009  . Adaptation reaction 05/28/2009  . Acid reflux 05/28/2009  . Arthritis, degenerative 05/28/2009  . 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 Carotid artery occlusion without infarction 06/01/2006  . PAD (peripheral artery disease) - bilateral common iliac stents 12/15/2005  . Aortoiliac occlusive disease ([place]) 11/30/2005  . Atherosclerotic heart disease of artery bypass graft 09/01/1997   Janna Arch, PT, DPT   11/07/2019, 1:00 PM  [place] MAIN [facility] SERVICES [address] [place], [place],  [address] Phone: [phone]   Fax:  [phone]  Name: [name] MRN: [id number] Date of Birth: 08-Jul-1934

## 2019-11-09 ENCOUNTER — Other Ambulatory Visit: Payer: Self-pay

## 2019-11-09 ENCOUNTER — Ambulatory Visit: Payer: Medicare Other

## 2019-11-09 DIAGNOSIS — R2681 Unsteadiness on feet: Secondary | ICD-10-CM

## 2019-11-09 DIAGNOSIS — R2689 Other abnormalities of gait and mobility: Secondary | ICD-10-CM

## 2019-11-09 DIAGNOSIS — M6281 Muscle weakness (generalized): Secondary | ICD-10-CM

## 2019-11-09 NOTE — Therapy (Signed)
[place] MAIN [facility] SERVICES [address] [place], [place], [address] Phone: [phone]   Fax:  [phone]  Physical Therapy Treatment  Patient Details  Name: [name] MRN: [id number] Date of Birth: July 06, 1934 Referring Provider (PT): Eulogio Ditch   Encounter Date: 11/09/2019  PT End of Session - 11/09/19 1120    Visit Number  39    Number of Visits  45    Date for PT Re-Evaluation  11/28/19    Authorization Type  9/10 PN start 2/5    PT Start Time  1017    PT Stop Time  1101    PT Time Calculation (min)  44 min    Equipment Utilized During Treatment  Gait belt    Activity Tolerance  Patient tolerated treatment well    Behavior During Therapy  WFL for tasks assessed/performed       Past Medical History:  Diagnosis Date  . AAA (abdominal aortic aneurysm) ([place])   . Arthritis   . Atherosclerotic heart disease of native coronary artery without angina pectoris 1998   - s/p CABG, occluded SVG-D1; patent LIMA-LAD, SVG-OM, SVG- RCA  . Basal cell carcinoma of eyelid 01/03/2013   2019 - R side of Nose  . Benign neoplasm of ascending colon   . Benign neoplasm of descending colon   . Cancer [facility])    Skin cancer  . Colon polyps   . COPD (chronic obstructive pulmonary disease) ([place])   . Coronary artery disease   . Dementia ([place])   . Dysrhythmia    atrial fib  . Falls frequently   . GERD (gastroesophageal reflux disease)   . Hemorrhoid 02/10/2015  . History of hiatal hernia   . History of kidney stones   . HOH (hard of hearing)   . Hypertension   . Memory loss   . Osteoporosis   . PAF (paroxysmal atrial fibrillation) ([place])   . Prostate cancer ([place])   . Prostate disorder   . Sciatic pain    Chronic  . Sleep apnea    cpap  . Temporary cerebral vascular dysfunction 02/10/2015   Had negative work-up.  July, 2012.  No medication changes, had forgot them that day, got dehydrated.   . Urinary incontinence     Past Surgical  History:  Procedure Laterality Date  . ABDOMINAL AORTIC ENDOVASCULAR STENT GRAFT Bilateral 05/18/2019   Procedure: ABDOMINAL AORTIC ENDOVASCULAR STENT GRAFT;  Surgeon: Algernon Huxley, MD;  Location: [facility] ORS;  Service: Vascular;  Laterality: Bilateral;  . ANKLE SURGERY    . Arch aortogram and carotid aortogram  06/02/2006   Dr Oneida Alar did surgery  . BASAL CELL CARCINOMA EXCISION  04/2016   Dermatology  . CARDIAC CATHETERIZATION  01/30/1998    (Dr. Linard Millers): Native LAD & mRCA CTO. 90% D1. Mod Cx. SVG-D1 CTO. SVG-RCA & SVG-OM along with LIMA-LAD patent;  LV NORMAL Fxn  . CAROTID ENDARTERECTOMY Left Oct. 29, 2007   Dr. Oneida Alar:  . CATARACT EXTRACTION W/PHACO Right 03/05/2017   Procedure: CATARACT EXTRACTION PHACO AND INTRAOCULAR LENS PLACEMENT (IOC);  Surgeon: Leandrew Koyanagi, MD;  Location: [facility] ORS;  Service: Ophthalmology;  Laterality: Right;  [place] 00:58.5 AP% 10.6 CDE 6.20 Fluid Pack lot # [id number]  . CATARACT EXTRACTION W/PHACO Left 04/15/2017   Procedure: CATARACT EXTRACTION PHACO AND INTRAOCULAR LENS PLACEMENT ([place])  Left;  Surgeon: Leandrew Koyanagi, MD;  Location: [place];  Service: Ophthalmology;  Laterality: Left;  . COLONOSCOPY WITH PROPOFOL N/A 09/15/2017  Procedure: COLONOSCOPY WITH PROPOFOL;  Surgeon: Lucilla Lame, MD;  Location: [facility] ENDOSCOPY;  Service: Endoscopy;  Laterality: N/A;  . COLONOSCOPY WITH PROPOFOL N/A 10/26/2018   Procedure: COLONOSCOPY WITH PROPOFOL;  Surgeon: Lucilla Lame, MD;  Location: [facility] ENDOSCOPY;  Service: Endoscopy;  Laterality: N/A;  . CORONARY ARTERY BYPASS GRAFT  1998   LIMA-LAD, SVG-OM, SVG-RPDA, SVG-DIAG  . CPET / MET  12/2012   Mild chronotropic incompetence - read 82% of predicted; also reduced effort; peak VO2 15.7 / 75% (did not reach Max effort) -- suggested ischemic response in last 1.5 minutes of exercise. Normal pulmonary function on PFTs but poor response to  . DOPPLER ECHOCARDIOGRAPHY  11/20/2010   EF =>55%; LV norm mild aortic  scelorosis  . ENDOVASCULAR REPAIR/STENT GRAFT N/A 05/18/2019   Procedure: ENDOVASCULAR REPAIR/STENT GRAFT;  Surgeon: Algernon Huxley, MD;  Location: [place] CV LAB;  Service: Cardiovascular;  Laterality: N/A;  . ESOPHAGOGASTRODUODENOSCOPY (EGD) WITH PROPOFOL N/A 09/15/2017   Procedure: ESOPHAGOGASTRODUODENOSCOPY (EGD) WITH PROPOFOL;  Surgeon: Lucilla Lame, MD;  Location: [facility] ENDOSCOPY;  Service: Endoscopy;  Laterality: N/A;  . FRACTURE SURGERY Right 03/19/2015   wrist  . HIATAL HERNIA REPAIR    . ILIAC ARTERY STENT  12/15/2005   PTA and direct stenting rgt and lft common iliac arteries  . KYPHOPLASTY N/A 04/05/2019   Procedure: KYPHOPLASTY T12, L3, L4 L5;  Surgeon: Hessie Knows, MD;  Location: [facility] ORS;  Service: Orthopedics;  Laterality: N/A;  . NM GATED MYOVIEW ([place] HX)  12/2017   Low Risk - no ischemia or infarct.  EF > 65%. no RWMA  . NM MYOVIEW ([place] HX)  11/11/2010; February 2016   a) TM STRESS----NORMAL PERFUSION ,EF 68%; b) EF 50-55%. Normal LV function. No significant ischemia or infarction.  . OPEN REDUCTION INTERNAL FIXATION (ORIF) DISTAL RADIAL FRACTURE Left 01/13/2019   Procedure: OPEN REDUCTION INTERNAL FIXATION (ORIF) DISTAL  RADIAL FRACTURE - LEFT - SLEEP APNEA;  Surgeon: Hessie Knows, MD;  Location: [facility] ORS;  Service: Orthopedics;  Laterality: Left;  . ORIF ELBOW FRACTURE Right 01/01/2017   Procedure: OPEN REDUCTION INTERNAL FIXATION (ORIF) ELBOW/OLECRANON FRACTURE;  Surgeon: Hessie Knows, MD;  Location: [facility] ORS;  Service: Orthopedics;  Laterality: Right;  . ORIF WRIST FRACTURE Right 03/19/2015   Procedure: OPEN REDUCTION INTERNAL FIXATION (ORIF) WRIST FRACTURE;  Surgeon: Hessie Knows, MD;  Location: [facility] ORS;  Service: Orthopedics;  Laterality: Right;  . THORACIC AORTA - CAROTID ANGIOGRAM  October 2007   Dr. 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 TRANSTHORACIC ECHOCARDIOGRAM  February  2016   [facility]: Normal LV function. Dilated left atrium.    There were no vitals filed for this visit.  Subjective Assessment - 11/09/19 1115    Subjective  Patient reports he hasn't been able to go on walks since no one will go with him, excited to ambulate outside with PT to practice negotiation of obstacles. No falls or LOB since last session.    Pertinent History  Patient referred to physical therapy for balance/strength issues s/p abdominal aortic aneurysm (AAA) without rupture repair on 05/18/19 .  Patient has been seen by this therapist in the past (last seen in march of 2020) Patient had a fall on 01/08/19, MRI from [facility] dated 03/28/19 report acute compression fractures at T12, L3 and L4 as well as facet arthropathy at the lower lumbar levels and at L3-4 moderate central stenosis. Patient underwent injection on 10/6 at L4-5  and L5-S1. PMH includes AAA, arthritis, atherosclerotic heart disease, (s/p CABG), benign neoplasm of ascending/descending colon, COPD, CAD, dementia, dysrhythmia, frequent falls, GERD, hemorrhoid, HOH, HTN, PAF, prostate cancer. Monday through Friday have 3 people come in different times from 730 to 12pm. Uses a walker occasionally now    Limitations  Walking;Standing;House hold activities;Other (comment);Lifting    How long can you sit comfortably?  20 minutes before back is painful    How long can you stand comfortably?  hour    How long can you walk comfortably?  using a walker about 30 minutes    Patient Stated Goals  patient would like to improve his balance and strenght.    Currently in Pain?  No/denies         Treatment:  Gait mechanics:  Ambulate >2000 ft with 1000 ft without AD with just CGA, last 1000 ft with single UE support. Two rest breaks seated, short negotiation of unstable surface (grass, unstable concrete). Cueing for widening BOS, clearing feet bilaterally, and safety awareness with occasional min A for stability.   In gym:  airex pad: one foot on  airex pad one foot on 6" step modified tandem hold 30 seconds each LE  airex pad: throwing balls at velcro target for pertubation and dual task, frequent forward near LOB, x 5 minutes  Sit to stand from low surface chair, arms crossed 10x, one near LOB   Single LE stand with UE support 30 seconds each LE  GTB df 15x each LE      Seated rest breaks to monitor oxygen and HR. Breathing technique reminders required.    Pt educated throughout session about proper posture and technique with exercises. Improved exercise technique, movement at target joints, use of target muscles after min to mod verbal, visual, tactile cues.                  PT Education - 11/09/19 1119    Education provided  Yes    Education Details  exercise technique, body mechanics    Person(s) Educated  Patient    Methods  Explanation;Demonstration;Tactile cues;Verbal cues    Comprehension  Verbalized understanding;Returned demonstration;Verbal cues required;Tactile cues required       PT Short Term Goals - 10/03/19 1312      PT SHORT TERM GOAL #1   Title  Patient will be independent in home exercise program to improve strength/mobility for better functional independence with ADLs.    Baseline  10/16: HEP given 11/17: HEP compliant    Time  4    Period  Weeks    Status  Achieved    Target Date  07/15/19      PT SHORT TERM GOAL #2   Title  Patient will deny any falls over past 4 weeks to demonstrate improved safety awareness at home and work.    Baseline  10/16: one major fall 11/17: no falls    Time  4    Period  Weeks    Status  Achieved    Target Date  07/15/19        PT Long Term Goals - 10/03/19 1423      PT LONG TERM GOAL #1   Title  Patient will increase BLE gross strength to 4+/5 as to improve functional strength for independent gait, increased standing tolerance and increased ADL ability.    Baseline  10/16: R gross 4-/5 with add/ext 3+/5 L grossly 3+/5 with hip add/ext 2+/5  11/17: R 4/5 add/ext; -/  5 L 4-/5 hip add/ext 3/5 12/8: R: 4/5 L 4-/5 hip add/ext 3+/5 2/1: R 4+/5 L 4/5 with hip ext 4-/5    Time  8    Period  Weeks    Status  Partially Met    Target Date  11/28/19      PT LONG TERM GOAL #2   Title  Patient (> 107 years old) will complete five times sit to stand test in < 15 seconds indicating an increased LE strength and improved balance.    Baseline  10/16: 17 seconds with heavy use of hands on knees 11/17: 16 seconds one near LOB 12/8: 13 seconds one posterior LOB    Time  8    Period  Weeks    Status  Achieved      PT LONG TERM GOAL #3   Title  Patient will increase Berg Balance score by > 6 points (43/56)  to demonstrate decreased fall risk during functional activities.    Baseline  10/16: 37/56 11/17: 42/56 12/8: 47/56    Time  8    Period  Weeks    Status  Achieved      PT LONG TERM GOAL #4   Title  Patient will increase 10 meter walk test to >1.7ms as to improve gait speed for better community ambulation and to reduce fall risk.    Baseline  10/16: 0.91 w/o AD 11/17: 1.43 m/s with no AD    Time  8    Period  Weeks    Status  Achieved      PT LONG TERM GOAL #5   Title  Patient will increase ABC scale score >80% to demonstrate better functional mobility and better confidence with ADLs.    Baseline  10/16; 71% 11/17: 80% 12/8: 83%    Time  8    Period  Weeks    Status  Achieved      PT LONG TERM GOAL #6   Title  Patient will improve score on DGI to > or = 19/24 on the DGI to increase dynamic balance during mobility and to decrease risk of falls.    Baseline  12/8: perform next session 12/29: 18/24 2/1: 18/24    Time  8    Period  Weeks    Status  On-going    Target Date  11/28/19      PT LONG TERM GOAL #7   Title  Patient will increase Berg Balance score by > 6 points (53/56)  to demonstrate decreased fall risk during functional activities.    Baseline  12/8; 47/56 12/29: 49/56 2/1: 46/56    Time  8    Period  Weeks    Status   On-going    Target Date  11/28/19            Plan - 11/09/19 1122    Clinical Impression Statement  Patient is eager to participate with physical therapy today. Has not been able to ambulate outside between sessions due to not having the assistance needed, agreeable to ambulation with PT to practice safe negotiation of obstacles, unstable surfaces, and prolonged mobility. Patient initially was CGA with cueing for first 1000 ft however upon fatigue he required SUE support and increased cueing for safety awareness in addition to two seated rest breaks. Patient would benefit from further skilled PT intervention to increase strength and stability for safer ADL performance and increased functional ability.    Personal Factors and Comorbidities  Age;Comorbidity 3+;Fitness;Past/Current Experience;Sex;Social  Background;Time since onset of injury/illness/exacerbation;Transportation    Comorbidities  AAA, arthritis, atherosclerotic heart disease, (s/p CABG), benign neoplasm of ascending/descending colon, COPD, CAD, dementia, dysrhythmia, frequent falls, GERD, hemorrhoid, HOH, HTN, PAF, prostate cancer.    Examination-Activity Limitations  Bathing;Bend;Carry;Dressing;Continence;Stairs;Squat;Reach Overhead;Locomotion Level;Lift;Stand;Transfers;Toileting    Examination-Participation Restrictions  Church;Cleaning;Community Activity;Driving;Interpersonal Relationship;Laundry;Volunteer;Shop;Personal Finances;Meal Prep;Yard Work    [employer] Potential  Fair    Clinical Impairments Affecting Rehab Potential  (+) good support system, high prior level of function (-) currently undergoing radiation therapy for prostate cancer, memory deficits     PT Frequency  2x / week    PT Duration  8 weeks    PT Treatment/Interventions  ADLs/Self Care Home Management;Aquatic Therapy;Cryotherapy;Moist Heat;Traction;Ultrasound;Therapeutic activities;Functional mobility training;Stair training;Gait training;DME Instruction;Therapeutic  exercise;Balance training;Neuromuscular re-education;Patient/family education;Manual techniques;Passive range of motion;Energy conservation;Vestibular;Taping    PT Next Visit Plan  progress note    PT Home Exercise Plan  see above    Consulted and Agree with Plan of Care  Patient       Patient will benefit from skilled therapeutic intervention in order to improve the following deficits and impairments:  Abnormal gait, Cardiopulmonary status limiting activity, Decreased activity tolerance, Decreased balance, Decreased knowledge of precautions, Decreased endurance, Decreased coordination, Decreased cognition, Decreased knowledge of use of DME, Decreased mobility, Difficulty walking, Decreased safety awareness, Decreased strength, Impaired flexibility, Impaired perceived functional ability, Impaired sensation, Postural dysfunction, Improper body mechanics, Pain  Visit Diagnosis: Unsteadiness on feet  Muscle weakness (generalized)  Other abnormalities of gait and mobility     Problem List Patient Active Problem List   Diagnosis Date Noted  . Status post abdominal aortic aneurysm (AAA) repair 04/15/2019  . Candidiasis 02/22/2019  . Malignant neoplasm of descending colon ([place])   . Benign neoplasm of descending colon   . Benign neoplasm of transverse colon   . Do not intubate, cardiopulmonary resuscitation (CPR)-only code status 10/08/2018  . Prostate cancer ([place]) 02/25/2018  . Prostatic cyst 01/15/2018  . 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 ([place]) 09/15/2017  . Blood in stool   . Abnormal feces   . Stricture and stenosis of esophagus   . Mild cognitive impairment 08/18/2017  . Senile purpura ([place]) 04/29/2017  . Anemia 04/29/2017  . Frequent falls 04/29/2017  . Acute respiratory failure ([place]) 12/17/2016  . Simple chronic bronchitis ([place]) 12/17/2016  . Benign localized hyperplasia of prostate with urinary obstruction 07/15/2016  . Elevated PSA 07/15/2016  . Incomplete emptying of bladder  07/15/2016  . Urge incontinence 07/15/2016  . Hypothyroidism 04/25/2016  . Macular degeneration 04/25/2016  . Erectile dysfunction due to arterial insufficiency   . Ischemic heart disease with chronotropic incompetence   . CN (constipation) 02/10/2015  . DD (diverticular disease) 02/10/2015  . Fatigue 02/10/2015  . Lichen planus 76/28/3151  . Restless leg 02/10/2015  . 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 B12 deficiency 02/10/2015  . COPD (chronic obstructive pulmonary disease) ([place]) 11/14/2014  . Post Inflammatory Lung Changes 11/14/2014  . PAF (paroxysmal atrial fibrillation) ([facility]) CHA2DS2-VASc = 4. AC = Eliquis   . Insomnia 12/09/2013  . OSA on CPAP   . Dyspnea on exertion - -essentially resolved with BB dose reduction & wgt loss 03/29/2013  . Overweight (BMI 25.0-29.9) -- Wgt back up 01/17/2013  . Atherosclerotic heart disease of native coronary artery without angina pectoris - s/p CABG, occluded SVG-D1; patent LIMA-LAD, SVG-OM, SVG- RCA   . Essential hypertension   . Hyperlipidemia with target LDL less than 70   . Aorto-iliac disease ([place])   .  BCC (basal cell carcinoma), eyelid 01/03/2013  . Allergic rhinitis 08/17/2009  . Adaptation reaction 05/28/2009  . Acid reflux 05/28/2009  . Arthritis, degenerative 05/28/2009  . 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 Carotid artery occlusion without infarction 06/01/2006  . PAD (peripheral artery disease) - bilateral common iliac stents 12/15/2005  . Aortoiliac occlusive disease ([place]) 11/30/2005  . Atherosclerotic heart disease of artery bypass graft 09/01/1997   Janna Arch, PT, DPT   11/09/2019, 11:23 AM  [place] MAIN [facility] SERVICES [address] [place], [place], [address] Phone: [phone]   Fax:  [phone]  Name: [name] MRN: [id number] Date of Birth: 11-24-1933

## 2019-11-14 ENCOUNTER — Other Ambulatory Visit: Payer: Self-pay

## 2019-11-14 ENCOUNTER — Ambulatory Visit: Payer: Medicare Other

## 2019-11-14 DIAGNOSIS — R2681 Unsteadiness on feet: Secondary | ICD-10-CM

## 2019-11-14 DIAGNOSIS — R2689 Other abnormalities of gait and mobility: Secondary | ICD-10-CM

## 2019-11-14 DIAGNOSIS — R262 Difficulty in walking, not elsewhere classified: Secondary | ICD-10-CM

## 2019-11-14 DIAGNOSIS — M6281 Muscle weakness (generalized): Secondary | ICD-10-CM

## 2019-11-14 NOTE — Therapy (Signed)
[place] [facility] [address] [place], [place], [address] Phone: [phone]   Fax:  [phone]  Physical Therapy Treatment Physical Therapy Progress Note   Dates of reporting period  10/07/19   to   11/14/19  Patient Details  Name: [name] MRN: [id number] Date of Birth: 1934-07-18 Referring Provider (PT): Eulogio Ditch   Encounter Date: 11/14/2019  PT End of Session - 11/14/19 1021    Visit Number  40    Number of Visits  45    Date for PT Re-Evaluation  11/28/19    Authorization Type  10/10 PN start 2/5; next session 1/10 PN 3/15    PT Start Time  1015    PT Stop Time  1059    PT Time Calculation (min)  44 min    Equipment Utilized During Treatment  Gait belt    Activity Tolerance  Patient tolerated treatment well    Behavior During Therapy  WFL for tasks assessed/performed       Past Medical History:  Diagnosis Date  . AAA (abdominal aortic aneurysm) ([place])   . Arthritis   . Atherosclerotic heart disease of native coronary artery without angina pectoris 1998   - s/p CABG, occluded SVG-D1; patent LIMA-LAD, SVG-OM, SVG- RCA  . Basal cell carcinoma of eyelid 01/03/2013   2019 - R side of Nose  . Benign neoplasm of ascending colon   . Benign neoplasm of descending colon   . Cancer [facility])    Skin cancer  . Colon polyps   . COPD (chronic obstructive pulmonary disease) ([place])   . Coronary artery disease   . Dementia ([place])   . Dysrhythmia    atrial fib  . Falls frequently   . GERD (gastroesophageal reflux disease)   . Hemorrhoid 02/10/2015  . History of hiatal hernia   . History of kidney stones   . HOH (hard of hearing)   . Hypertension   . Memory loss   . Osteoporosis   . PAF (paroxysmal atrial fibrillation) ([place])   . Prostate cancer ([place])   . Prostate disorder   . Sciatic pain    Chronic  . Sleep apnea    cpap  . Temporary cerebral vascular dysfunction 02/10/2015   Had negative work-up.  July, 2012.   No medication changes, had forgot them that day, got dehydrated.   . Urinary incontinence     Past Surgical History:  Procedure Laterality Date  . ABDOMINAL AORTIC ENDOVASCULAR STENT GRAFT Bilateral 05/18/2019   Procedure: ABDOMINAL AORTIC ENDOVASCULAR STENT GRAFT;  Surgeon: Algernon Huxley, MD;  Location: [facility] ORS;  Service: Vascular;  Laterality: Bilateral;  . ANKLE SURGERY    . Arch aortogram and carotid aortogram  06/02/2006   Dr Oneida Alar did surgery  . BASAL CELL CARCINOMA EXCISION  04/2016   Dermatology  . CARDIAC CATHETERIZATION  01/30/1998    (Dr. Linard Millers): Native LAD & mRCA CTO. 90% D1. Mod Cx. SVG-D1 CTO. SVG-RCA & SVG-OM along with LIMA-LAD patent;  LV NORMAL Fxn  . CAROTID ENDARTERECTOMY Left Oct. 29, 2007   Dr. Oneida Alar:  . CATARACT EXTRACTION W/PHACO Right 03/05/2017   Procedure: CATARACT EXTRACTION PHACO AND INTRAOCULAR LENS PLACEMENT (IOC);  Surgeon: Leandrew Koyanagi, MD;  Location: [facility] ORS;  Service: Ophthalmology;  Laterality: Right;  [place] 00:58.5 AP% 10.6 CDE 6.20 Fluid Pack lot # [id number]  . CATARACT EXTRACTION W/PHACO Left 04/15/2017   Procedure: CATARACT EXTRACTION PHACO AND INTRAOCULAR LENS PLACEMENT (IOC)  Left;  Surgeon: Leandrew Koyanagi, MD;  Location: [place];  Service: Ophthalmology;  Laterality: Left;  . COLONOSCOPY WITH PROPOFOL N/A 09/15/2017   Procedure: COLONOSCOPY WITH PROPOFOL;  Surgeon: Lucilla Lame, MD;  Location: [facility] ENDOSCOPY;  Service: Endoscopy;  Laterality: N/A;  . COLONOSCOPY WITH PROPOFOL N/A 10/26/2018   Procedure: COLONOSCOPY WITH PROPOFOL;  Surgeon: Lucilla Lame, MD;  Location: [facility] ENDOSCOPY;  Service: Endoscopy;  Laterality: N/A;  . CORONARY ARTERY BYPASS GRAFT  1998   LIMA-LAD, SVG-OM, SVG-RPDA, SVG-DIAG  . CPET / MET  12/2012   Mild chronotropic incompetence - read 82% of predicted; also reduced effort; peak VO2 15.7 / 75% (did not reach Max effort) -- suggested ischemic response in last 1.5 minutes of exercise. Normal  pulmonary function on PFTs but poor response to  . DOPPLER ECHOCARDIOGRAPHY  11/20/2010   EF =>55%; LV norm mild aortic scelorosis  . ENDOVASCULAR REPAIR/STENT GRAFT N/A 05/18/2019   Procedure: ENDOVASCULAR REPAIR/STENT GRAFT;  Surgeon: Algernon Huxley, MD;  Location: [place] [facility];  Service: Cardiovascular;  Laterality: N/A;  . ESOPHAGOGASTRODUODENOSCOPY (EGD) WITH PROPOFOL N/A 09/15/2017   Procedure: ESOPHAGOGASTRODUODENOSCOPY (EGD) WITH PROPOFOL;  Surgeon: Lucilla Lame, MD;  Location: [facility] ENDOSCOPY;  Service: Endoscopy;  Laterality: N/A;  . FRACTURE SURGERY Right 03/19/2015   wrist  . HIATAL HERNIA REPAIR    . ILIAC ARTERY STENT  12/15/2005   PTA and direct stenting rgt and lft common iliac arteries  . KYPHOPLASTY N/A 04/05/2019   Procedure: KYPHOPLASTY T12, L3, L4 L5;  Surgeon: Hessie Knows, MD;  Location: [facility] ORS;  Service: Orthopedics;  Laterality: N/A;  . NM GATED MYOVIEW ([place] [facility])  12/2017   Low Risk - no ischemia or infarct.  EF > 65%. no RWMA  . NM MYOVIEW ([place] [facility])  11/11/2010; February 2016   a) TM STRESS----NORMAL PERFUSION ,EF 68%; b) EF 50-55%. Normal LV function. No significant ischemia or infarction.  . OPEN REDUCTION INTERNAL FIXATION (ORIF) DISTAL RADIAL FRACTURE Left 01/13/2019   Procedure: OPEN REDUCTION INTERNAL FIXATION (ORIF) DISTAL  RADIAL FRACTURE - LEFT - SLEEP APNEA;  Surgeon: Hessie Knows, MD;  Location: [facility] ORS;  Service: Orthopedics;  Laterality: Left;  . ORIF ELBOW FRACTURE Right 01/01/2017   Procedure: OPEN REDUCTION INTERNAL FIXATION (ORIF) ELBOW/OLECRANON FRACTURE;  Surgeon: Hessie Knows, MD;  Location: [facility] ORS;  Service: Orthopedics;  Laterality: Right;  . ORIF WRIST FRACTURE Right 03/19/2015   Procedure: OPEN REDUCTION INTERNAL FIXATION (ORIF) WRIST FRACTURE;  Surgeon: Hessie Knows, MD;  Location: [facility] ORS;  Service: Orthopedics;  Laterality: Right;  . THORACIC AORTA - CAROTID ANGIOGRAM  October 2007   Dr. 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 TRANSTHORACIC ECHOCARDIOGRAM  February 2016   [facility]: Normal LV function. Dilated left atrium.    There were no vitals filed for this visit.  Subjective Assessment - 11/14/19 1019    Subjective  Patient reports he was walking the past few days due to the nicer weather and having someone to walk with. Reports no falls or LOB since last session. Occasional foot soreness after walking but none at the moment.    Pertinent History  Patient referred to physical therapy for balance/strength issues s/p abdominal aortic aneurysm (AAA) without rupture repair on 05/18/19 .  Patient has been seen by this therapist in the past (last seen in march of 2020) Patient had a fall on 01/08/19, MRI from [facility] dated 03/28/19 report acute compression fractures at  T12, L3 and L4 as well as facet arthropathy at the lower lumbar levels and at L3-4 moderate central stenosis. Patient underwent injection on 10/6 at L4-5 and L5-S1. PMH includes AAA, arthritis, atherosclerotic heart disease, (s/p CABG), benign neoplasm of ascending/descending colon, COPD, CAD, dementia, dysrhythmia, frequent falls, GERD, hemorrhoid, HOH, HTN, PAF, prostate cancer. Monday through Friday have 3 people come in different times from 730 to 12pm. Uses a walker occasionally now    Limitations  Walking;Standing;House hold activities;Other (comment);Lifting    How long can you sit comfortably?  20 minutes before back is painful    How long can you stand comfortably?  hour    How long can you walk comfortably?  using a walker about 30 minutes    Patient Stated Goals  patient would like to improve his balance and strenght.    Currently in Pain?  No/denies        Goals:  [facility] PT Assessment - 11/14/19 0001      Standardized Balance Assessment   Standardized Balance Assessment  Berg Balance Test;Dynamic Gait Index      Berg Balance Test   Sit to Stand  Able to stand without  using hands and stabilize independently    Standing Unsupported  Able to stand safely 2 minutes    Sitting with Back Unsupported but Feet Supported on Floor or Stool  Able to sit safely and securely 2 minutes    Stand to Sit  Sits safely with minimal use of hands    Transfers  Able to transfer safely, minor use of hands    Standing Unsupported with Eyes Closed  Able to stand 10 seconds with supervision    Standing Unsupported with Feet Together  Able to place feet together independently and stand 1 minute safely    From Standing, Reach Forward with Outstretched Arm  Can reach forward >5 cm safely (2")    From Standing Position, Pick up Object from [place] to pick up shoe safely and easily    From Standing Position, Turn to Look Behind Over each Shoulder  Looks behind from both sides and weight shifts well    Turn 360 Degrees  Able to turn 360 degrees safely one side only in 4 seconds or less    Standing Unsupported, Alternately Place Feet on Step/Stool  Able to stand independently and safely and complete 8 steps in 20 seconds    Standing Unsupported, One Foot in Front  Able to take small step independently and hold 30 seconds    Standing on One Leg  Tries to lift leg/unable to hold 3 seconds but remains standing independently    Total Score  47      Dynamic Gait Index   Level Surface  Normal    Change in Gait Speed  Normal    Gait with Horizontal Head Turns  Normal    Gait with Vertical Head Turns  Mild Impairment    Gait and Pivot Turn  Mild Impairment    Step Over Obstacle  Mild Impairment    Step Around Obstacles  Normal    Steps  Mild Impairment    Total Score  20      Goals:  DGI: 20/ 24  BERG: 47/56    Treat:  7" step : eccentric heel taps BUE support 15x each LE airex pad; tendem stance 30 second holds each LE, 2 sets each LE, more challenging LLE Seated abduction 15x GTB    Access Code: [email or web address]  URL: [email or web address] Date: 11/14/2019 Prepared  by: Janna Arch  Exercises Standing Single Leg Stance with Counter Support - 1 x daily - 7 x weekly - 2 sets - 2 reps - 30 hold Standing March with Counter Support - 1 x daily - 7 x weekly - 10 reps - 2 sets - 5 hold    Patient's condition has the potential to improve in response to therapy. Maximum improvement is yet to be obtained. The anticipated improvement is attainable and reasonable in a generally predictable time.  Patient reports his balance is improving but not where he wants it to be. Can't recall what things he is challenged with at home.                  PT Education - 11/14/19 1020    Education provided  Yes    Education Details  exercise technique, body mechanics, goals    Person(s) Educated  Patient    Methods  Explanation;Demonstration;Tactile cues;Verbal cues    Comprehension  Verbalized understanding;Returned demonstration;Verbal cues required;Tactile cues required       PT Short Term Goals - 10/03/19 1312      PT SHORT TERM GOAL #1   Title  Patient will be independent in home exercise program to improve strength/mobility for better functional independence with ADLs.    Baseline  10/16: HEP given 11/17: HEP compliant    Time  4    Period  Weeks    Status  Achieved    Target Date  07/15/19      PT SHORT TERM GOAL #2   Title  Patient will deny any falls over past 4 weeks to demonstrate improved safety awareness at home and work.    Baseline  10/16: one major fall 11/17: no falls    Time  4    Period  Weeks    Status  Achieved    Target Date  07/15/19        PT Long Term Goals - 11/14/19 1100      PT LONG TERM GOAL #1   Title  Patient will increase BLE gross strength to 4+/5 as to improve functional strength for independent gait, increased standing tolerance and increased ADL ability.    Baseline  10/16: R gross 4-/5 with add/ext 3+/5 L grossly 3+/5 with hip add/ext 2+/5 11/17: R 4/5 add/ext; -/5 L 4-/5 hip add/ext 3/5 12/8: R: 4/5 L 4-/5 hip  add/ext 3+/5 2/1: R 4+/5 L 4/5 with hip ext 4-/5    Time  8    Period  Weeks    Status  Partially Met    Target Date  11/28/19      PT LONG TERM GOAL #2   Title  Patient (> 95 years old) will complete five times sit to stand test in < 15 seconds indicating an increased LE strength and improved balance.    Baseline  10/16: 17 seconds with heavy use of hands on knees 11/17: 16 seconds one near LOB 12/8: 13 seconds one posterior LOB    Time  8    Period  Weeks    Status  Achieved      PT LONG TERM GOAL #3   Title  Patient will increase Berg Balance score by > 6 points (43/56)  to demonstrate decreased fall risk during functional activities.    Baseline  10/16: 37/56 11/17: 42/56 12/8: 47/56    Time  8    Period  Weeks    Status  Achieved      PT LONG TERM GOAL #4   Title  Patient will increase 10 meter walk test to >1.73ms as to improve gait speed for better community ambulation and to reduce fall risk.    Baseline  10/16: 0.91 w/o AD 11/17: 1.43 m/s with no AD    Time  8    Period  Weeks    Status  Achieved      PT LONG TERM GOAL #5   Title  Patient will increase ABC scale score >80% to demonstrate better functional mobility and better confidence with ADLs.    Baseline  10/16; 71% 11/17: 80% 12/8: 83%    Time  8    Period  Weeks    Status  Achieved      PT LONG TERM GOAL #6   Title  Patient will improve score on DGI to > or = 19/24 on the DGI to increase dynamic balance during mobility and to decrease risk of falls.    Baseline  12/8: perform next session 12/29: 18/24 2/1: 18/24 3/15: 20/24    Time  8    Period  Weeks    Status  Achieved      PT LONG TERM GOAL #7   Title  Patient will increase Berg Balance score by > 6 points (53/56)  to demonstrate decreased fall risk during functional activities.    Baseline  12/8; 47/56 12/29: 49/56 2/1: 46/56 3/15: 47/56    Time  8    Period  Weeks    Status  Partially Met    Target Date  11/28/19            Plan - 11/14/19  1103    Clinical Impression Statement  Patient has met his DGI goal with improved stability with dynamic mobility. Decreased episodes of instability with head movements performed. He continues to be challenged with single limb stability with additional Hep added for focus on task. Patient's condition has the potential to improve in response to therapy. Maximum improvement is yet to be obtained. The anticipated improvement is attainable and reasonable in a generally predictable time. Patient would benefit from further skilled PT intervention to increase strength and stability for safer ADL performance and increased functional ability.    Personal Factors and Comorbidities  Age;Comorbidity 3+;Fitness;Past/Current Experience;Sex;Social Background;Time since onset of injury/illness/exacerbation;Transportation    Comorbidities  AAA, arthritis, atherosclerotic heart disease, (s/p CABG), benign neoplasm of ascending/descending colon, COPD, CAD, dementia, dysrhythmia, frequent falls, GERD, hemorrhoid, HOH, HTN, PAF, prostate cancer.    Examination-Activity Limitations  Bathing;Bend;Carry;Dressing;Continence;Stairs;Squat;Reach Overhead;Locomotion Level;Lift;Stand;Transfers;Toileting    Examination-Participation Restrictions  Church;Cleaning;Community Activity;Driving;Interpersonal Relationship;Laundry;Volunteer;Shop;Personal Finances;Meal Prep;Yard Work    RPublixPotential  Fair    Clinical Impairments Affecting Rehab Potential  (+) good support system, high prior level of function (-) currently undergoing radiation therapy for prostate cancer, memory deficits     PT Frequency  2x / week    PT Duration  8 weeks    PT Treatment/Interventions  ADLs/Self Care Home Management;Aquatic Therapy;Cryotherapy;Moist Heat;Traction;Ultrasound;Therapeutic activities;Functional mobility training;Stair training;Gait training;DME Instruction;Therapeutic exercise;Balance training;Neuromuscular re-education;Patient/family  education;Manual techniques;Passive range of motion;Energy conservation;Vestibular;Taping    PT Next Visit Plan  single limb stability    PT Home Exercise Plan  see above    Consulted and Agree with Plan of Care  Patient       Patient will benefit from skilled therapeutic intervention in order to improve the following deficits and impairments:  Abnormal gait, Cardiopulmonary status limiting activity, Decreased activity  tolerance, Decreased balance, Decreased knowledge of precautions, Decreased endurance, Decreased coordination, Decreased cognition, Decreased knowledge of use of DME, Decreased mobility, Difficulty walking, Decreased safety awareness, Decreased strength, Impaired flexibility, Impaired perceived functional ability, Impaired sensation, Postural dysfunction, Improper body mechanics, Pain  Visit Diagnosis: Unsteadiness on feet  Muscle weakness (generalized)  Other abnormalities of gait and mobility  Difficulty in walking, not elsewhere classified     Problem List Patient Active Problem List   Diagnosis Date Noted  . Status post abdominal aortic aneurysm (AAA) repair 04/15/2019  . Candidiasis 02/22/2019  . Malignant neoplasm of descending colon ([place])   . Benign neoplasm of descending colon   . Benign neoplasm of transverse colon   . Do not intubate, cardiopulmonary resuscitation (CPR)-only code status 10/08/2018  . Prostate cancer ([place]) 02/25/2018  . Prostatic cyst 01/15/2018  . 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 ([place]) 09/15/2017  . Blood in stool   . Abnormal feces   . Stricture and stenosis of esophagus   . Mild cognitive impairment 08/18/2017  . Senile purpura ([place]) 04/29/2017  . Anemia 04/29/2017  . Frequent falls 04/29/2017  . Acute respiratory failure ([place]) 12/17/2016  . Simple chronic bronchitis ([place]) 12/17/2016  . Benign localized hyperplasia of prostate with urinary obstruction 07/15/2016  . Elevated PSA 07/15/2016  . Incomplete emptying of bladder  07/15/2016  . Urge incontinence 07/15/2016  . Hypothyroidism 04/25/2016  . Macular degeneration 04/25/2016  . Erectile dysfunction due to arterial insufficiency   . Ischemic heart disease with chronotropic incompetence   . CN (constipation) 02/10/2015  . DD (diverticular disease) 02/10/2015  . Fatigue 02/10/2015  . Lichen planus 54/27/0623  . Restless leg 02/10/2015  . 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 B12 deficiency 02/10/2015  . COPD (chronic obstructive pulmonary disease) ([place]) 11/14/2014  . Post Inflammatory Lung Changes 11/14/2014  . PAF (paroxysmal atrial fibrillation) ([facility]) CHA2DS2-VASc = 4. AC = Eliquis   . Insomnia 12/09/2013  . OSA on CPAP   . Dyspnea on exertion - -essentially resolved with BB dose reduction & wgt loss 03/29/2013  . Overweight (BMI 25.0-29.9) -- Wgt back up 01/17/2013  . Atherosclerotic heart disease of native coronary artery without angina pectoris - s/p CABG, occluded SVG-D1; patent LIMA-LAD, SVG-OM, SVG- RCA   . Essential hypertension   . Hyperlipidemia with target LDL less than 70   . Aorto-iliac disease ([place])   . BCC (basal cell carcinoma), eyelid 01/03/2013  . Allergic rhinitis 08/17/2009  . Adaptation reaction 05/28/2009  . Acid reflux 05/28/2009  . Arthritis, degenerative 05/28/2009  . 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 Carotid artery occlusion without infarction 06/01/2006  . PAD (peripheral artery disease) - bilateral common iliac stents 12/15/2005  . Aortoiliac occlusive disease ([place]) 11/30/2005  . Atherosclerotic heart disease of artery bypass graft 09/01/1997   Janna Arch, PT, DPT   11/14/2019, 11:04 AM  [place] [facility]IN [facility] SERVICES [address] [place], [place], [address] Phone: [phone]   Fax:  [phone]  Name: [name] MRN: [id number] Date of Birth: 1934-07-15

## 2019-11-16 ENCOUNTER — Other Ambulatory Visit: Payer: Self-pay

## 2019-11-16 ENCOUNTER — Ambulatory Visit: Payer: Medicare Other

## 2019-11-16 DIAGNOSIS — R2681 Unsteadiness on feet: Secondary | ICD-10-CM

## 2019-11-16 DIAGNOSIS — R2689 Other abnormalities of gait and mobility: Secondary | ICD-10-CM

## 2019-11-16 DIAGNOSIS — M6281 Muscle weakness (generalized): Secondary | ICD-10-CM

## 2019-11-16 NOTE — Therapy (Signed)
[place] MAIN [facility] SERVICES [address] [place], [place], [address] Phone: [phone]   Fax:  [phone]  Physical Therapy Treatment  Patient Details  Name: [name] MRN: [id number] Date of Birth: 21-Mar-1934 Referring Provider (PT): Eulogio Ditch   Encounter Date: 11/16/2019  PT End of Session - 11/16/19 1220    Visit Number  41    Number of Visits  45    Date for PT Re-Evaluation  11/28/19    Authorization Type  1/10 PN 3/15    PT Start Time  1015    PT Stop Time  1059    PT Time Calculation (min)  44 min    Equipment Utilized During Treatment  Gait belt    Activity Tolerance  Patient tolerated treatment well    Behavior During Therapy  [facility] for tasks assessed/performed       Past Medical History:  Diagnosis Date  . AAA (abdominal aortic aneurysm) ([place])   . Arthritis   . Atherosclerotic heart disease of native coronary artery without angina pectoris 1998   - s/p CABG, occluded SVG-D1; patent LIMA-LAD, SVG-OM, SVG- RCA  . Basal cell carcinoma of eyelid 01/03/2013   2019 - R side of Nose  . Benign neoplasm of ascending colon   . Benign neoplasm of descending colon   . Cancer [facility])    Skin cancer  . Colon polyps   . COPD (chronic obstructive pulmonary disease) ([place])   . Coronary artery disease   . Dementia ([place])   . Dysrhythmia    atrial fib  . Falls frequently   . GERD (gastroesophageal reflux disease)   . Hemorrhoid 02/10/2015  . History of hiatal hernia   . History of kidney stones   . HOH (hard of hearing)   . Hypertension   . Memory loss   . Osteoporosis   . PAF (paroxysmal atrial fibrillation) ([place])   . Prostate cancer ([place])   . Prostate disorder   . Sciatic pain    Chronic  . Sleep apnea    cpap  . Temporary cerebral vascular dysfunction 02/10/2015   Had negative work-up.  July, 2012.  No medication changes, had forgot them that day, got dehydrated.   . Urinary incontinence     Past Surgical  History:  Procedure Laterality Date  . ABDOMINAL AORTIC ENDOVASCULAR STENT GRAFT Bilateral 05/18/2019   Procedure: ABDOMINAL AORTIC ENDOVASCULAR STENT GRAFT;  Surgeon: Algernon Huxley, MD;  Location: [facility] ORS;  Service: Vascular;  Laterality: Bilateral;  . ANKLE SURGERY    . Arch aortogram and carotid aortogram  06/02/2006   Dr Oneida Alar did surgery  . BASAL CELL CARCINOMA EXCISION  04/2016   Dermatology  . CARDIAC CATHETERIZATION  01/30/1998    (Dr. Linard Millers): Native LAD & mRCA CTO. 90% D1. Mod Cx. SVG-D1 CTO. SVG-RCA & SVG-OM along with LIMA-LAD patent;  LV NORMAL Fxn  . CAROTID ENDARTERECTOMY Left Oct. 29, 2007   Dr. Oneida Alar:  . CATARACT EXTRACTION W/PHACO Right 03/05/2017   Procedure: CATARACT EXTRACTION PHACO AND INTRAOCULAR LENS PLACEMENT (IOC);  Surgeon: Leandrew Koyanagi, MD;  Location: [facility] ORS;  Service: Ophthalmology;  Laterality: Right;  [place] 00:58.5 AP% 10.6 CDE 6.20 Fluid Pack lot # [id number]  . CATARACT EXTRACTION W/PHACO Left 04/15/2017   Procedure: CATARACT EXTRACTION PHACO AND INTRAOCULAR LENS PLACEMENT ([place])  Left;  Surgeon: Leandrew Koyanagi, MD;  Location: [place];  Service: Ophthalmology;  Laterality: Left;  . COLONOSCOPY WITH PROPOFOL N/A 09/15/2017  Procedure: COLONOSCOPY WITH PROPOFOL;  Surgeon: Lucilla Lame, MD;  Location: [facility] ENDOSCOPY;  Service: Endoscopy;  Laterality: N/A;  . COLONOSCOPY WITH PROPOFOL N/A 10/26/2018   Procedure: COLONOSCOPY WITH PROPOFOL;  Surgeon: Lucilla Lame, MD;  Location: [facility] ENDOSCOPY;  Service: Endoscopy;  Laterality: N/A;  . CORONARY ARTERY BYPASS GRAFT  1998   LIMA-LAD, SVG-OM, SVG-RPDA, SVG-DIAG  . CPET / MET  12/2012   Mild chronotropic incompetence - read 82% of predicted; also reduced effort; peak VO2 15.7 / 75% (did not reach Max effort) -- suggested ischemic response in last 1.5 minutes of exercise. Normal pulmonary function on PFTs but poor response to  . DOPPLER ECHOCARDIOGRAPHY  11/20/2010   EF =>55%; LV norm mild aortic  scelorosis  . ENDOVASCULAR REPAIR/STENT GRAFT N/A 05/18/2019   Procedure: ENDOVASCULAR REPAIR/STENT GRAFT;  Surgeon: Algernon Huxley, MD;  Location: [place] CV LAB;  Service: Cardiovascular;  Laterality: N/A;  . ESOPHAGOGASTRODUODENOSCOPY (EGD) WITH PROPOFOL N/A 09/15/2017   Procedure: ESOPHAGOGASTRODUODENOSCOPY (EGD) WITH PROPOFOL;  Surgeon: Lucilla Lame, MD;  Location: [facility] ENDOSCOPY;  Service: Endoscopy;  Laterality: N/A;  . FRACTURE SURGERY Right 03/19/2015   wrist  . HIATAL HERNIA REPAIR    . ILIAC ARTERY STENT  12/15/2005   PTA and direct stenting rgt and lft common iliac arteries  . KYPHOPLASTY N/A 04/05/2019   Procedure: KYPHOPLASTY T12, L3, L4 L5;  Surgeon: Hessie Knows, MD;  Location: [facility] ORS;  Service: Orthopedics;  Laterality: N/A;  . NM GATED MYOVIEW ([place] HX)  12/2017   Low Risk - no ischemia or infarct.  EF > 65%. no RWMA  . NM MYOVIEW ([place] HX)  11/11/2010; February 2016   a) TM STRESS----NORMAL PERFUSION ,EF 68%; b) EF 50-55%. Normal LV function. No significant ischemia or infarction.  . OPEN REDUCTION INTERNAL FIXATION (ORIF) DISTAL RADIAL FRACTURE Left 01/13/2019   Procedure: OPEN REDUCTION INTERNAL FIXATION (ORIF) DISTAL  RADIAL FRACTURE - LEFT - SLEEP APNEA;  Surgeon: Hessie Knows, MD;  Location: [facility] ORS;  Service: Orthopedics;  Laterality: Left;  . ORIF ELBOW FRACTURE Right 01/01/2017   Procedure: OPEN REDUCTION INTERNAL FIXATION (ORIF) ELBOW/OLECRANON FRACTURE;  Surgeon: Hessie Knows, MD;  Location: [facility] ORS;  Service: Orthopedics;  Laterality: Right;  . ORIF WRIST FRACTURE Right 03/19/2015   Procedure: OPEN REDUCTION INTERNAL FIXATION (ORIF) WRIST FRACTURE;  Surgeon: Hessie Knows, MD;  Location: [facility] ORS;  Service: Orthopedics;  Laterality: Right;  . THORACIC AORTA - CAROTID ANGIOGRAM  October 2007   Dr. 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 TRANSTHORACIC ECHOCARDIOGRAM  February  2016   [facility]: Normal LV function. Dilated left atrium.    There were no vitals filed for this visit.  Subjective Assessment - 11/16/19 1216    Subjective  Patient reports his right knee (5/10 pain) and left ankle (3-5/10 pain) have been bothering him since yesterday. Reports it began when he was trying to get out of a chair. Would like some exercises to do at home for his knee and ankle    Pertinent History  Patient referred to physical therapy for balance/strength issues s/p abdominal aortic aneurysm (AAA) without rupture repair on 05/18/19 .  Patient has been seen by this therapist in the past (last seen in march of 2020) Patient had a fall on 01/08/19, MRI from [facility] dated 03/28/19 report acute compression fractures at T12, L3 and L4 as well as facet arthropathy at the lower lumbar levels and at L3-4 moderate  central stenosis. Patient underwent injection on 10/6 at L4-5 and L5-S1. PMH includes AAA, arthritis, atherosclerotic heart disease, (s/p CABG), benign neoplasm of ascending/descending colon, COPD, CAD, dementia, dysrhythmia, frequent falls, GERD, hemorrhoid, HOH, HTN, PAF, prostate cancer. Monday through Friday have 3 people come in different times from 730 to 12pm. Uses a walker occasionally now    Limitations  Walking;Standing;House hold activities;Other (comment);Lifting    How long can you sit comfortably?  20 minutes before back is painful    How long can you stand comfortably?  hour    How long can you walk comfortably?  using a walker about 30 minutes    Patient Stated Goals  patient would like to improve his balance and strenght.    Currently in Pain?  Yes    Pain Score  5     Pain Location  Knee    Pain Orientation  Right    Pain Descriptors / Indicators  Aching    Pain Type  Acute pain    Pain Onset  Yesterday    Pain Frequency  Intermittent    Aggravating Factors   sit to stand    Pain Relieving Factors  rest    Pain Score  4    Pain Location  Ankle    Pain Orientation  Left     Pain Descriptors / Indicators  Aching;Stabbing    Pain Type  Acute pain    Pain Onset  Yesterday    Pain Frequency  Intermittent    Aggravating Factors   sit to stand    Pain Relieving Factors  rest       Right knee (5/10 pain) left ankle (3-5 pain) pain. Pain began yesterday when getting up out of a chair.      Access Code: [email or web address] URL: [email or web address] Date: 11/16/2019 Prepared by: Janna Arch  Exercises Seated Long Arc Quad - 1 x daily - 7 x weekly - 10 reps - 2 sets - 5 hold Seated Hip Adduction Isometrics with Ball - 1 x daily - 7 x weekly - 10 reps - 2 sets - 5 hold Seated Hip Abduction with Resistance - 1 x daily - 7 x weekly - 10 reps - 2 sets - 5 hold Supine Heel Slide - 1 x daily - 7 x weekly - 10 reps - 2 sets - 5 hold Supine March - 1 x daily - 7 x weekly - 10 reps - 2 sets - 5 hold Supine Gluteal Sets - 1 x daily - 7 x weekly - 10 reps - 2 sets - 5 hold Modified Thomas Stretch - 1 x daily - 7 x weekly - 2 sets - 2 reps - 30 hold -seated ankle alphabet 1x each LE    treatment for rest of session:  Seated  Heel toe raise 15x   RTB around toes, marching with df x 10 each LE, max cueing for alignment x 10 each LE  RTB around toes, Eversion  15x each LE  GTB hamstring curl 15x each LE, single LE at a time against PT resistance Weighted ball ( 2000Gr) straight arm raise 10x bent arm rotation 10x   Ambulate 110 ft with SUE support; antalgic gait pattern with decreased hip and knee flexion of RLE.   Seated rest breaks to monitor oxygen and HR. Breathing technique reminders required.    Pt educated throughout session about proper posture and technique with exercises. Improved exercise technique, movement at target joints, use of target muscles after min  to mod verbal, visual, tactile cues.          PT Education - 11/16/19 1220    Education provided  Yes    Education Details  additional HEP, Geophysicist/field seismologist) Educated   Patient    Methods  Explanation;Demonstration;Tactile cues;Verbal cues;Handout    Comprehension  Verbalized understanding;Returned demonstration;Verbal cues required;Tactile cues required       PT Short Term Goals - 10/03/19 1312      PT SHORT TERM GOAL #1   Title  Patient will be independent in home exercise program to improve strength/mobility for better functional independence with ADLs.    Baseline  10/16: HEP given 11/17: HEP compliant    Time  4    Period  Weeks    Status  Achieved    Target Date  07/15/19      PT SHORT TERM GOAL #2   Title  Patient will deny any falls over past 4 weeks to demonstrate improved safety awareness at home and work.    Baseline  10/16: one major fall 11/17: no falls    Time  4    Period  Weeks    Status  Achieved    Target Date  07/15/19        PT Long Term Goals - 11/14/19 1100      PT LONG TERM GOAL #1   Title  Patient will increase BLE gross strength to 4+/5 as to improve functional strength for independent gait, increased standing tolerance and increased ADL ability.    Baseline  10/16: R gross 4-/5 with add/ext 3+/5 L grossly 3+/5 with hip add/ext 2+/5 11/17: R 4/5 add/ext; -/5 L 4-/5 hip add/ext 3/5 12/8: R: 4/5 L 4-/5 hip add/ext 3+/5 2/1: R 4+/5 L 4/5 with hip ext 4-/5    Time  8    Period  Weeks    Status  Partially Met    Target Date  11/28/19      PT LONG TERM GOAL #2   Title  Patient (> 72 years old) will complete five times sit to stand test in < 15 seconds indicating an increased LE strength and improved balance.    Baseline  10/16: 17 seconds with heavy use of hands on knees 11/17: 16 seconds one near LOB 12/8: 13 seconds one posterior LOB    Time  8    Period  Weeks    Status  Achieved      PT LONG TERM GOAL #3   Title  Patient will increase Berg Balance score by > 6 points (43/56)  to demonstrate decreased fall risk during functional activities.    Baseline  10/16: 37/56 11/17: 42/56 12/8: 47/56    Time  8    Period   Weeks    Status  Achieved      PT LONG TERM GOAL #4   Title  Patient will increase 10 meter walk test to >1.46ms as to improve gait speed for better community ambulation and to reduce fall risk.    Baseline  10/16: 0.91 w/o AD 11/17: 1.43 m/s with no AD    Time  8    Period  Weeks    Status  Achieved      PT LONG TERM GOAL #5   Title  Patient will increase ABC scale score >80% to demonstrate better functional mobility and better confidence with ADLs.    Baseline  10/16; 71% 11/17: 80% 12/8: 83%  Time  8    Period  Weeks    Status  Achieved      PT LONG TERM GOAL #6   Title  Patient will improve score on DGI to > or = 19/24 on the DGI to increase dynamic balance during mobility and to decrease risk of falls.    Baseline  12/8: perform next session 12/29: 18/24 2/1: 18/24 3/15: 20/24    Time  8    Period  Weeks    Status  Achieved      PT LONG TERM GOAL #7   Title  Patient will increase Berg Balance score by > 6 points (53/56)  to demonstrate decreased fall risk during functional activities.    Baseline  12/8; 47/56 12/29: 49/56 2/1: 46/56 3/15: 47/56    Time  8    Period  Weeks    Status  Partially Met    Target Date  11/28/19            Plan - 11/16/19 1222    Clinical Impression Statement  New/additional Hep given to patient for R knee and L ankle pain with patient performing each exercise with PT to demonstrate understanding with corrections given as needed. Antalgic gait pattern noted with decreased stability requiring UE support. Increased confusion/distraction noted throughout session requiring frequent task orientation. Patient would benefit from further skilled PT intervention to increase strength and stability for safer ADL performance and increased functional ability.    Personal Factors and Comorbidities  Age;Comorbidity 3+;Fitness;Past/Current Experience;Sex;Social Background;Time since onset of injury/illness/exacerbation;Transportation    Comorbidities  AAA,  arthritis, atherosclerotic heart disease, (s/p CABG), benign neoplasm of ascending/descending colon, COPD, CAD, dementia, dysrhythmia, frequent falls, GERD, hemorrhoid, HOH, HTN, PAF, prostate cancer.    Examination-Activity Limitations  Bathing;Bend;Carry;Dressing;Continence;Stairs;Squat;Reach Overhead;Locomotion Level;Lift;Stand;Transfers;Toileting    Examination-Participation Restrictions  Church;Cleaning;Community Activity;Driving;Interpersonal Relationship;Laundry;Volunteer;Shop;Personal Finances;Meal Prep;Yard Work    [employer] Potential  Fair    Clinical Impairments Affecting Rehab Potential  (+) good support system, high prior level of function (-) currently undergoing radiation therapy for prostate cancer, memory deficits     PT Frequency  2x / week    PT Duration  8 weeks    PT Treatment/Interventions  ADLs/Self Care Home Management;Aquatic Therapy;Cryotherapy;Moist Heat;Traction;Ultrasound;Therapeutic activities;Functional mobility training;Stair training;Gait training;DME Instruction;Therapeutic exercise;Balance training;Neuromuscular re-education;Patient/family education;Manual techniques;Passive range of motion;Energy conservation;Vestibular;Taping    PT Next Visit Plan  single limb stability    PT Home Exercise Plan  see above    Consulted and Agree with Plan of Care  Patient       Patient will benefit from skilled therapeutic intervention in order to improve the following deficits and impairments:  Abnormal gait, Cardiopulmonary status limiting activity, Decreased activity tolerance, Decreased balance, Decreased knowledge of precautions, Decreased endurance, Decreased coordination, Decreased cognition, Decreased knowledge of use of DME, Decreased mobility, Difficulty walking, Decreased safety awareness, Decreased strength, Impaired flexibility, Impaired perceived functional ability, Impaired sensation, Postural dysfunction, Improper body mechanics, Pain  Visit Diagnosis: Unsteadiness on  feet  Muscle weakness (generalized)  Other abnormalities of gait and mobility     Problem List Patient Active Problem List   Diagnosis Date Noted  . Status post abdominal aortic aneurysm (AAA) repair 04/15/2019  . Candidiasis 02/22/2019  . Malignant neoplasm of descending colon ([place])   . Benign neoplasm of descending colon   . Benign neoplasm of transverse colon   . Do not intubate, cardiopulmonary resuscitation (CPR)-only code status 10/08/2018  . Prostate cancer ([place]) 02/25/2018  . Prostatic cyst 01/15/2018  .  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 Blood in stool   . Abnormal feces   . Stricture and stenosis of esophagus   . Mild cognitive impairment 08/18/2017  . Senile purpura ([place]) 04/29/2017  . Anemia 04/29/2017  . Frequent falls 04/29/2017  . Acute respiratory failure ([place]) 12/17/2016  . Simple chronic bronchitis ([place]) 12/17/2016  . Benign localized hyperplasia of prostate with urinary obstruction 07/15/2016  . Elevated PSA 07/15/2016  . Incomplete emptying of bladder 07/15/2016  . Urge incontinence 07/15/2016  . Hypothyroidism 04/25/2016  . Macular degeneration 04/25/2016  . Erectile dysfunction due to arterial insufficiency   . Ischemic heart disease with chronotropic incompetence   . CN (constipation) 02/10/2015  . DD (diverticular disease) 02/10/2015  . Fatigue 02/10/2015  . Lichen planus 09/62/8366  . Restless leg 02/10/2015  . 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 B12 deficiency 02/10/2015  . COPD (chronic obstructive pulmonary disease) ([place]) 11/14/2014  . Post Inflammatory Lung Changes 11/14/2014  . PAF (paroxysmal atrial fibrillation) ([facility]) CHA2DS2-VASc = 4. AC = Eliquis   . Insomnia 12/09/2013  . OSA on CPAP   . Dyspnea on exertion - -essentially resolved with BB dose reduction & wgt loss 03/29/2013  . Overweight (BMI 25.0-29.9) -- Wgt back up 01/17/2013  . Atherosclerotic heart disease of native coronary artery without angina  pectoris - s/p CABG, occluded SVG-D1; patent LIMA-LAD, SVG-OM, SVG- RCA   . Essential hypertension   . Hyperlipidemia with target LDL less than 70   . Aorto-iliac disease ([place])   . BCC (basal cell carcinoma), eyelid 01/03/2013  . Allergic rhinitis 08/17/2009  . Adaptation reaction 05/28/2009  . Acid reflux 05/28/2009  . Arthritis, degenerative 05/28/2009  . 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 Carotid artery occlusion without infarction 06/01/2006  . PAD (peripheral artery disease) - bilateral common iliac stents 12/15/2005  . Aortoiliac occlusive disease ([place]) 11/30/2005  . Atherosclerotic heart disease of artery bypass graft 09/01/1997   Janna Arch, PT, DPT   11/16/2019, 12:23 PM  [place] MAIN [facility] SERVICES [address] [place], [place], [address] Phone: [phone]   Fax:  [phone]  Name: [name] MRN: [id number] Date of Birth: Oct 09, 1933

## 2019-11-16 NOTE — Patient Instructions (Signed)
Access Code: [email or web address] URL: [email or web address] Date: 11/16/2019 Prepared by: Janna Arch  Exercises Seated Long Arc Quad - 1 x daily - 7 x weekly - 10 reps - 2 sets - 5 hold Seated Hip Adduction Isometrics with Ball - 1 x daily - 7 x weekly - 10 reps - 2 sets - 5 hold Seated Hip Abduction with Resistance - 1 x daily - 7 x weekly - 10 reps - 2 sets - 5 hold Supine Heel Slide - 1 x daily - 7 x weekly - 10 reps - 2 sets - 5 hold Supine March - 1 x daily - 7 x weekly - 10 reps - 2 sets - 5 hold Supine Gluteal Sets - 1 x daily - 7 x weekly - 10 reps - 2 sets - 5 hold Modified Thomas Stretch - 1 x daily - 7 x weekly - 2 sets - 2 reps - 30 hold -seated ankle alphabet 1x each LE

## 2019-11-21 ENCOUNTER — Ambulatory Visit: Payer: Medicare Other

## 2019-11-21 ENCOUNTER — Other Ambulatory Visit: Payer: Self-pay

## 2019-11-21 DIAGNOSIS — R2681 Unsteadiness on feet: Secondary | ICD-10-CM

## 2019-11-21 DIAGNOSIS — R262 Difficulty in walking, not elsewhere classified: Secondary | ICD-10-CM

## 2019-11-21 DIAGNOSIS — M6281 Muscle weakness (generalized): Secondary | ICD-10-CM

## 2019-11-21 DIAGNOSIS — R2689 Other abnormalities of gait and mobility: Secondary | ICD-10-CM

## 2019-11-21 NOTE — Therapy (Signed)
[place] MAIN [facility] SERVICES [address] [place], [place], [address] Phone: [phone]   Fax:  [phone]  Physical Therapy Treatment  Patient Details  Name: [name] MRN: [id number] Date of Birth: 08-30-1934 Referring Provider (PT): Eulogio Ditch   Encounter Date: 11/21/2019  PT End of Session - 11/21/19 1011    Visit Number  42    Number of Visits  45    Date for PT Re-Evaluation  11/28/19    Authorization Type  2/10 PN 3/15    PT Start Time  1009    PT Stop Time  1054    PT Time Calculation (min)  45 min    Equipment Utilized During Treatment  Gait belt    Activity Tolerance  Patient tolerated treatment well    Behavior During Therapy  [facility] for tasks assessed/performed       Past Medical History:  Diagnosis Date  . AAA (abdominal aortic aneurysm) ([place])   . Arthritis   . Atherosclerotic heart disease of native coronary artery without angina pectoris 1998   - s/p CABG, occluded SVG-D1; patent LIMA-LAD, SVG-OM, SVG- RCA  . Basal cell carcinoma of eyelid 01/03/2013   2019 - R side of Nose  . Benign neoplasm of ascending colon   . Benign neoplasm of descending colon   . Cancer [facility])    Skin cancer  . Colon polyps   . COPD (chronic obstructive pulmonary disease) ([place])   . Coronary artery disease   . Dementia ([place])   . Dysrhythmia    atrial fib  . Falls frequently   . GERD (gastroesophageal reflux disease)   . Hemorrhoid 02/10/2015  . History of hiatal hernia   . History of kidney stones   . HOH (hard of hearing)   . Hypertension   . Memory loss   . Osteoporosis   . PAF (paroxysmal atrial fibrillation) ([place])   . Prostate cancer ([place])   . Prostate disorder   . Sciatic pain    Chronic  . Sleep apnea    cpap  . Temporary cerebral vascular dysfunction 02/10/2015   Had negative work-up.  July, 2012.  No medication changes, had forgot them that day, got dehydrated.   . Urinary incontinence     Past Surgical  History:  Procedure Laterality Date  . ABDOMINAL AORTIC ENDOVASCULAR STENT GRAFT Bilateral 05/18/2019   Procedure: ABDOMINAL AORTIC ENDOVASCULAR STENT GRAFT;  Surgeon: Algernon Huxley, MD;  Location: [facility] ORS;  Service: Vascular;  Laterality: Bilateral;  . ANKLE SURGERY    . Arch aortogram and carotid aortogram  06/02/2006   Dr Oneida Alar did surgery  . BASAL CELL CARCINOMA EXCISION  04/2016   Dermatology  . CARDIAC CATHETERIZATION  01/30/1998    (Dr. Linard Millers): Native LAD & mRCA CTO. 90% D1. Mod Cx. SVG-D1 CTO. SVG-RCA & SVG-OM along with LIMA-LAD patent;  LV NORMAL Fxn  . CAROTID ENDARTERECTOMY Left Oct. 29, 2007   Dr. Oneida Alar:  . CATARACT EXTRACTION W/PHACO Right 03/05/2017   Procedure: CATARACT EXTRACTION PHACO AND INTRAOCULAR LENS PLACEMENT (IOC);  Surgeon: Leandrew Koyanagi, MD;  Location: [facility] ORS;  Service: Ophthalmology;  Laterality: Right;  [place] 00:58.5 AP% 10.6 CDE 6.20 Fluid Pack lot # [id number]  . CATARACT EXTRACTION W/PHACO Left 04/15/2017   Procedure: CATARACT EXTRACTION PHACO AND INTRAOCULAR LENS PLACEMENT ([place])  Left;  Surgeon: Leandrew Koyanagi, MD;  Location: [place];  Service: Ophthalmology;  Laterality: Left;  . COLONOSCOPY WITH PROPOFOL N/A 09/15/2017  Procedure: COLONOSCOPY WITH PROPOFOL;  Surgeon: Lucilla Lame, MD;  Location: [facility] ENDOSCOPY;  Service: Endoscopy;  Laterality: N/A;  . COLONOSCOPY WITH PROPOFOL N/A 10/26/2018   Procedure: COLONOSCOPY WITH PROPOFOL;  Surgeon: Lucilla Lame, MD;  Location: [facility] ENDOSCOPY;  Service: Endoscopy;  Laterality: N/A;  . CORONARY ARTERY BYPASS GRAFT  1998   LIMA-LAD, SVG-OM, SVG-RPDA, SVG-DIAG  . CPET / MET  12/2012   Mild chronotropic incompetence - read 82% of predicted; also reduced effort; peak VO2 15.7 / 75% (did not reach Max effort) -- suggested ischemic response in last 1.5 minutes of exercise. Normal pulmonary function on PFTs but poor response to  . DOPPLER ECHOCARDIOGRAPHY  11/20/2010   EF =>55%; LV norm mild aortic  scelorosis  . ENDOVASCULAR REPAIR/STENT GRAFT N/A 05/18/2019   Procedure: ENDOVASCULAR REPAIR/STENT GRAFT;  Surgeon: Algernon Huxley, MD;  Location: [place] [facility];  Service: Cardiovascular;  Laterality: N/A;  . ESOPHAGOGASTRODUODENOSCOPY (EGD) WITH PROPOFOL N/A 09/15/2017   Procedure: ESOPHAGOGASTRODUODENOSCOPY (EGD) WITH PROPOFOL;  Surgeon: Lucilla Lame, MD;  Location: [facility] ENDOSCOPY;  Service: Endoscopy;  Laterality: N/A;  . FRACTURE SURGERY Right 03/19/2015   wrist  . HIATAL HERNIA REPAIR    . ILIAC ARTERY STENT  12/15/2005   PTA and direct stenting rgt and lft common iliac arteries  . KYPHOPLASTY N/A 04/05/2019   Procedure: KYPHOPLASTY T12, L3, L4 L5;  Surgeon: Hessie Knows, MD;  Location: [facility] ORS;  Service: Orthopedics;  Laterality: N/A;  . NM GATED MYOVIEW ([place] [facility])  12/2017   Low Risk - no ischemia or infarct.  EF > 65%. no RWMA  . NM MYOVIEW ([place] [facility])  11/11/2010; February 2016   a) TM STRESS----NORMAL PERFUSION ,EF 68%; b) EF 50-55%. Normal LV function. No significant ischemia or infarction.  . OPEN REDUCTION INTERNAL FIXATION (ORIF) DISTAL RADIAL FRACTURE Left 01/13/2019   Procedure: OPEN REDUCTION INTERNAL FIXATION (ORIF) DISTAL  RADIAL FRACTURE - LEFT - SLEEP APNEA;  Surgeon: Hessie Knows, MD;  Location: [facility] ORS;  Service: Orthopedics;  Laterality: Left;  . ORIF ELBOW FRACTURE Right 01/01/2017   Procedure: OPEN REDUCTION INTERNAL FIXATION (ORIF) ELBOW/OLECRANON FRACTURE;  Surgeon: Hessie Knows, MD;  Location: [facility] ORS;  Service: Orthopedics;  Laterality: Right;  . ORIF WRIST FRACTURE Right 03/19/2015   Procedure: OPEN REDUCTION INTERNAL FIXATION (ORIF) WRIST FRACTURE;  Surgeon: Hessie Knows, MD;  Location: [facility] ORS;  Service: Orthopedics;  Laterality: Right;  . THORACIC AORTA - CAROTID ANGIOGRAM  October 2007   Dr. 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 TRANSTHORACIC ECHOCARDIOGRAM  February  2016   [facility]: Normal LV function. Dilated left atrium.    There were no vitals filed for this visit.  Subjective Assessment - 11/21/19 1010    Subjective  Patient reports he did his HEP over the weekend. Had his son come and help fix his bathroom over the weekend.    Pertinent History  Patient referred to physical therapy for balance/strength issues s/p abdominal aortic aneurysm (AAA) without rupture repair on 05/18/19 .  Patient has been seen by this therapist in the past (last seen in march of 2020) Patient had a fall on 01/08/19, MRI from [facility] dated 03/28/19 report acute compression fractures at T12, L3 and L4 as well as facet arthropathy at the lower lumbar levels and at L3-4 moderate central stenosis. Patient underwent injection on 10/6 at L4-5 and L5-S1. PMH includes AAA, arthritis, atherosclerotic heart disease, (s/p CABG), benign neoplasm of  ascending/descending colon, COPD, CAD, dementia, dysrhythmia, frequent falls, GERD, hemorrhoid, HOH, HTN, PAF, prostate cancer. Monday through Friday have 3 people come in different times from 730 to 12pm. Uses a walker occasionally now    Limitations  Walking;Standing;House hold activities;Other (comment);Lifting    How long can you sit comfortably?  20 minutes before back is painful    How long can you stand comfortably?  hour    How long can you walk comfortably?  using a walker about 30 minutes    Patient Stated Goals  patient would like to improve his balance and strenght.    Currently in Pain?  Yes    Pain Score  3     Pain Location  Back    Pain Orientation  Lower    Pain Descriptors / Indicators  Aching    Pain Type  Chronic pain    Pain Onset  More than a month ago    Pain Frequency  Intermittent             Treatment  In // bars: CGA with cueing for sequencing and body mechanics   One foot airex pad one foot 6" step, static semi tandem stance 30 second holds each position; no episodes of instability this session  6" step up/down BUE  support 15x each LE   Balloon taps reaching inside/outside BOS without LOB, x 2 minutes  Standing outside of // bars: Weave between 6 cones, cueing for reduction of scissoring, no cones knocked over 2x   SUE support on chair, 3 cone tap alternating LE's 6x   Ambulate 210 ft with CGA, cueing for widening Bos  Horizontal head turns with ambulation, CGA, max cueing initially for task performance 2x 100 ft  Vertical head turns with ambulation, CGA, 2x 100 ft.    seated : cueing for body mechanics and sequencing for optimal muscle recruitment and strengthening.   Seated prostretch (bilateral) df/pf rocking 20x   Adduction 12x each LE RTB  against PT resistance   RTB Around ankles: ER/IR step out 12x each LE, one LE at a time: rainbow ball between knees    RTB Around ankles alternating LE kick outs 15x each LE    RTB hamstring curls 15x each LE   RTB pf resistance x 15 each LE   Sit to stand rainbow ball press 10x, very challenging, occasional instances of rocking to get to standing.      Seated rest breaks to monitor oxygen and HR. Breathing technique reminders required.    Pt educated throughout session about proper posture and technique with exercises. Improved exercise technique, movement at target joints, use of target muscles after min to mod verbal, visual, tactile cues.                    PT Education - 11/21/19 1011    Education provided  Yes    Education Details  exercise technique, body mechanics    Person(s) Educated  Patient    Methods  Explanation;Demonstration;Tactile cues;Verbal cues    Comprehension  Verbalized understanding;Returned demonstration;Verbal cues required;Tactile cues required       PT Short Term Goals - 10/03/19 1312      PT SHORT TERM GOAL #1   Title  Patient will be independent in home exercise program to improve strength/mobility for better functional independence with ADLs.    Baseline  10/16: HEP given 11/17: HEP compliant     Time  4    Period  Weeks  Status  Achieved    Target Date  07/15/19      PT SHORT TERM GOAL #2   Title  Patient will deny any falls over past 4 weeks to demonstrate improved safety awareness at home and work.    Baseline  10/16: one major fall 11/17: no falls    Time  4    Period  Weeks    Status  Achieved    Target Date  07/15/19        PT Long Term Goals - 11/14/19 1100      PT LONG TERM GOAL #1   Title  Patient will increase BLE gross strength to 4+/5 as to improve functional strength for independent gait, increased standing tolerance and increased ADL ability.    Baseline  10/16: R gross 4-/5 with add/ext 3+/5 L grossly 3+/5 with hip add/ext 2+/5 11/17: R 4/5 add/ext; -/5 L 4-/5 hip add/ext 3/5 12/8: R: 4/5 L 4-/5 hip add/ext 3+/5 2/1: R 4+/5 L 4/5 with hip ext 4-/5    Time  8    Period  Weeks    Status  Partially Met    Target Date  11/28/19      PT LONG TERM GOAL #2   Title  Patient (> 29 years old) will complete five times sit to stand test in < 15 seconds indicating an increased LE strength and improved balance.    Baseline  10/16: 17 seconds with heavy use of hands on knees 11/17: 16 seconds one near LOB 12/8: 13 seconds one posterior LOB    Time  8    Period  Weeks    Status  Achieved      PT LONG TERM GOAL #3   Title  Patient will increase Berg Balance score by > 6 points (43/56)  to demonstrate decreased fall risk during functional activities.    Baseline  10/16: 37/56 11/17: 42/56 12/8: 47/56    Time  8    Period  Weeks    Status  Achieved      PT LONG TERM GOAL #4   Title  Patient will increase 10 meter walk test to >1.75ms as to improve gait speed for better community ambulation and to reduce fall risk.    Baseline  10/16: 0.91 w/o AD 11/17: 1.43 m/s with no AD    Time  8    Period  Weeks    Status  Achieved      PT LONG TERM GOAL #5   Title  Patient will increase ABC scale score >80% to demonstrate better functional mobility and better confidence  with ADLs.    Baseline  10/16; 71% 11/17: 80% 12/8: 83%    Time  8    Period  Weeks    Status  Achieved      PT LONG TERM GOAL #6   Title  Patient will improve score on DGI to > or = 19/24 on the DGI to increase dynamic balance during mobility and to decrease risk of falls.    Baseline  12/8: perform next session 12/29: 18/24 2/1: 18/24 3/15: 20/24    Time  8    Period  Weeks    Status  Achieved      PT LONG TERM GOAL #7   Title  Patient will increase Berg Balance score by > 6 points (53/56)  to demonstrate decreased fall risk during functional activities.    Baseline  12/8; 47/56 12/29: 49/56 2/1: 46/56 3/15: 47/56  Time  8    Period  Weeks    Status  Partially Met    Target Date  11/28/19            Plan - 11/21/19 1046    Clinical Impression Statement  Patient demonstrates improved ankle righting reactions on unstable surfaces this session. Ambulating with horizontal head turns results in more instability than vertical this session. Patient is limited by fatigue with prolonged ambulation requiring occasional seated rest breaks between walking interventions.  Patient would benefit from further skilled PT intervention to increase strength and stability for safer ADL performance and increased functional ability.    Personal Factors and Comorbidities  Age;Comorbidity 3+;Fitness;Past/Current Experience;Sex;Social Background;Time since onset of injury/illness/exacerbation;Transportation    Comorbidities  AAA, arthritis, atherosclerotic heart disease, (s/p CABG), benign neoplasm of ascending/descending colon, COPD, CAD, dementia, dysrhythmia, frequent falls, GERD, hemorrhoid, HOH, HTN, PAF, prostate cancer.    Examination-Activity Limitations  Bathing;Bend;Carry;Dressing;Continence;Stairs;Squat;Reach Overhead;Locomotion Level;Lift;Stand;Transfers;Toileting    Examination-Participation Restrictions  Church;Cleaning;Community Activity;Driving;Interpersonal  Relationship;Laundry;Volunteer;Shop;Personal Finances;Meal Prep;Yard Work    [employer] Potential  Fair    Clinical Impairments Affecting Rehab Potential  (+) good support system, high prior level of function (-) currently undergoing radiation therapy for prostate cancer, memory deficits     PT Frequency  2x / week    PT Duration  8 weeks    PT Treatment/Interventions  ADLs/Self Care Home Management;Aquatic Therapy;Cryotherapy;Moist Heat;Traction;Ultrasound;Therapeutic activities;Functional mobility training;Stair training;Gait training;DME Instruction;Therapeutic exercise;Balance training;Neuromuscular re-education;Patient/family education;Manual techniques;Passive range of motion;Energy conservation;Vestibular;Taping    PT Next Visit Plan  single limb stability    PT Home Exercise Plan  see above    Consulted and Agree with Plan of Care  Patient       Patient will benefit from skilled therapeutic intervention in order to improve the following deficits and impairments:  Abnormal gait, Cardiopulmonary status limiting activity, Decreased activity tolerance, Decreased balance, Decreased knowledge of precautions, Decreased endurance, Decreased coordination, Decreased cognition, Decreased knowledge of use of DME, Decreased mobility, Difficulty walking, Decreased safety awareness, Decreased strength, Impaired flexibility, Impaired perceived functional ability, Impaired sensation, Postural dysfunction, Improper body mechanics, Pain  Visit Diagnosis: Unsteadiness on feet  Muscle weakness (generalized)  Other abnormalities of gait and mobility  Difficulty in walking, not elsewhere classified     Problem List Patient Active Problem List   Diagnosis Date Noted  . Status post abdominal aortic aneurysm (AAA) repair 04/15/2019  . Candidiasis 02/22/2019  . Malignant neoplasm of descending colon ([place])   . Benign neoplasm of descending colon   . Benign neoplasm of transverse colon   . Do not intubate,  cardiopulmonary resuscitation (CPR)-only code status 10/08/2018  . Prostate cancer ([place]) 02/25/2018  . Prostatic cyst 01/15/2018  . 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 ([place]) 09/15/2017  . Blood in stool   . Abnormal feces   . Stricture and stenosis of esophagus   . Mild cognitive impairment 08/18/2017  . Senile purpura ([place]) 04/29/2017  . Anemia 04/29/2017  . Frequent falls 04/29/2017  . Acute respiratory failure ([place]) 12/17/2016  . Simple chronic bronchitis ([place]) 12/17/2016  . Benign localized hyperplasia of prostate with urinary obstruction 07/15/2016  . Elevated PSA 07/15/2016  . Incomplete emptying of bladder 07/15/2016  . Urge incontinence 07/15/2016  . Hypothyroidism 04/25/2016  . Macular degeneration 04/25/2016  . Erectile dysfunction due to arterial insufficiency   . Ischemic heart disease with chronotropic incompetence   . CN (constipation) 02/10/2015  . DD (diverticular disease) 02/10/2015  . Fatigue 02/10/2015  . Lichen planus 51/70/0174  .  Restless leg 02/10/2015  . 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 B12 deficiency 02/10/2015  . COPD (chronic obstructive pulmonary disease) ([place]) 11/14/2014  . Post Inflammatory Lung Changes 11/14/2014  . PAF (paroxysmal atrial fibrillation) ([facility]) CHA2DS2-VASc = 4. AC = Eliquis   . Insomnia 12/09/2013  . OSA on CPAP   . Dyspnea on exertion - -essentially resolved with BB dose reduction & wgt loss 03/29/2013  . Overweight (BMI 25.0-29.9) -- Wgt back up 01/17/2013  . Atherosclerotic heart disease of native coronary artery without angina pectoris - s/p CABG, occluded SVG-D1; patent LIMA-LAD, SVG-OM, SVG- RCA   . Essential hypertension   . Hyperlipidemia with target LDL less than 70   . Aorto-iliac disease ([place])   . BCC (basal cell carcinoma), eyelid 01/03/2013  . Allergic rhinitis 08/17/2009  . Adaptation reaction 05/28/2009  . Acid reflux 05/28/2009  . Arthritis, degenerative 05/28/2009  . Abnormality of aortic arch branch  06/01/2006  . Carotid artery occlusion without infarction 06/01/2006  . PAD (peripheral artery disease) - bilateral common iliac stents 12/15/2005  . Aortoiliac occlusive disease ([place]) 11/30/2005  . Atherosclerotic heart disease of artery bypass graft 09/01/1997   Janna Arch, PT, DPT   11/21/2019, 10:58 AM  [place] MAIN [facility] SERVICES [address] [place], [place], [address] Phone: [phone]   Fax:  [phone]  Name: [name] MRN: [id number] Date of Birth: 03/25/1934

## 2019-11-23 ENCOUNTER — Ambulatory Visit: Payer: Medicare Other

## 2019-11-23 ENCOUNTER — Other Ambulatory Visit: Payer: Self-pay

## 2019-11-23 ENCOUNTER — Ambulatory Visit (INDEPENDENT_AMBULATORY_CARE_PROVIDER_SITE_OTHER): Payer: Medicare Other | Admitting: Urology

## 2019-11-23 VITALS — BP 113/72 | HR 65 | Ht 70.0 in | Wt 200.0 lb

## 2019-11-23 DIAGNOSIS — R2681 Unsteadiness on feet: Secondary | ICD-10-CM | POA: Diagnosis not present

## 2019-11-23 DIAGNOSIS — C61 Malignant neoplasm of prostate: Secondary | ICD-10-CM

## 2019-11-23 DIAGNOSIS — R2689 Other abnormalities of gait and mobility: Secondary | ICD-10-CM

## 2019-11-23 DIAGNOSIS — N200 Calculus of kidney: Secondary | ICD-10-CM | POA: Diagnosis not present

## 2019-11-23 DIAGNOSIS — M6281 Muscle weakness (generalized): Secondary | ICD-10-CM

## 2019-11-23 NOTE — Progress Notes (Signed)
11/23/2019 1:37 PM   [name] 06-03-1934 [id number]  Referring provider: [name], [address] [place],  [place] [address]  Chief Complaint  Patient presents with  . 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 Nephrolithiasis Nonobstructing left renal calculi  3.  Lower urinary tract symptoms -On Myrbetriq/tamsulosin   HPI: 86 y.o. male presents for follow-up.  -Last PSA 03/2019 0.16 -His wife states they saw [name] on 3/2 for a UTI however I do not see a note or a urinalysis report in epic -States his symptoms resolved with antibiotic -Denies dysuria, gross hematuria -Stable lower urinary tract symptoms -No flank, abdominal or pelvic pain   PMH: Past Medical History:  Diagnosis Date  . AAA (abdominal aortic aneurysm) ([place])   . Arthritis   . Atherosclerotic heart disease of native coronary artery without angina pectoris 1998   - s/p CABG, occluded SVG-D1; patent LIMA-LAD, SVG-OM, SVG- RCA  . Basal cell carcinoma of eyelid 01/03/2013   2019 - R side of Nose  . Benign neoplasm of ascending colon   . Benign neoplasm of descending colon   . Cancer [facility])    Skin cancer  . Colon polyps   . COPD (chronic obstructive pulmonary disease) ([place])   . Coronary artery disease   . Dementia ([place])   . Dysrhythmia    atrial fib  . Falls frequently   . GERD (gastroesophageal reflux disease)   . Hemorrhoid 02/10/2015  . History of hiatal hernia   . History of kidney stones   . HOH (hard of hearing)   . Hypertension   . Memory loss   . Osteoporosis   . PAF (paroxysmal atrial fibrillation) ([place])   . Prostate cancer ([place])   . Prostate disorder   . Sciatic pain    Chronic  . Sleep apnea    cpap  . Temporary cerebral vascular dysfunction 02/10/2015   Had negative  work-up.  July, 2012.  No medication changes, had forgot them that day, got dehydrated.   . Urinary incontinence     Surgical History: Past Surgical History:  Procedure Laterality Date  . ABDOMINAL AORTIC ENDOVASCULAR STENT GRAFT Bilateral 05/18/2019   Procedure: ABDOMINAL AORTIC ENDOVASCULAR STENT GRAFT;  Surgeon: Algernon Huxley, MD;  Location: [facility] ORS;  Service: Vascular;  Laterality: Bilateral;  . ANKLE SURGERY    . Arch aortogram and carotid aortogram  06/02/2006   Dr Oneida Alar did surgery  . BASAL CELL CARCINOMA EXCISION  04/2016   Dermatology  . CARDIAC CATHETERIZATION  01/30/1998    (Dr. Linard Millers): Native LAD & mRCA CTO. 90% D1. Mod Cx. SVG-D1 CTO. SVG-RCA & SVG-OM along with LIMA-LAD patent;  LV NORMAL Fxn  . CAROTID ENDARTERECTOMY Left Oct. 29, 2007   Dr. Oneida Alar:  . CATARACT EXTRACTION W/PHACO Right 03/05/2017   Procedure: CATARACT EXTRACTION PHACO AND INTRAOCULAR LENS PLACEMENT (IOC);  Surgeon: Leandrew Koyanagi, MD;  Location: [facility] ORS;  Service: Ophthalmology;  Laterality: Right;  [place] 00:58.5 AP% 10.6 CDE 6.20 Fluid Pack lot # [id number]  . CATARACT EXTRACTION W/PHACO Left 04/15/2017   Procedure: CATARACT EXTRACTION PHACO AND INTRAOCULAR LENS PLACEMENT ([place])  Left;  Surgeon: Leandrew Koyanagi, MD;  Location: [place];  Service: Ophthalmology;  Laterality: Left;  . COLONOSCOPY WITH PROPOFOL N/A 09/15/2017   Procedure: COLONOSCOPY WITH PROPOFOL;  Surgeon: Allen Norris,  Darren, MD;  Location: [place] ENDOSCOPY;  Service: Endoscopy;  Laterality: N/A;  . COLONOSCOPY WITH PROPOFOL N/A 10/26/2018   Procedure: COLONOSCOPY WITH PROPOFOL;  Surgeon: Lucilla Lame, MD;  Location: [facility] ENDOSCOPY;  Service: Endoscopy;  Laterality: N/A;  . CORONARY ARTERY BYPASS GRAFT  1998   LIMA-LAD, SVG-OM, SVG-RPDA, SVG-DIAG  . CPET / MET  12/2012   Mild chronotropic incompetence - read 82% of predicted; also reduced effort; peak VO2 15.7 / 75% (did not reach Max effort) -- suggested ischemic response in last  1.5 minutes of exercise. Normal pulmonary function on PFTs but poor response to  . DOPPLER ECHOCARDIOGRAPHY  11/20/2010   EF =>55%; LV norm mild aortic scelorosis  . ENDOVASCULAR REPAIR/STENT GRAFT N/A 05/18/2019   Procedure: ENDOVASCULAR REPAIR/STENT GRAFT;  Surgeon: Algernon Huxley, MD;  Location: [place] CV LAB;  Service: Cardiovascular;  Laterality: N/A;  . ESOPHAGOGASTRODUODENOSCOPY (EGD) WITH PROPOFOL N/A 09/15/2017   Procedure: ESOPHAGOGASTRODUODENOSCOPY (EGD) WITH PROPOFOL;  Surgeon: Lucilla Lame, MD;  Location: [facility] ENDOSCOPY;  Service: Endoscopy;  Laterality: N/A;  . FRACTURE SURGERY Right 03/19/2015   wrist  . HIATAL HERNIA REPAIR    . ILIAC ARTERY STENT  12/15/2005   PTA and direct stenting rgt and lft common iliac arteries  . KYPHOPLASTY N/A 04/05/2019   Procedure: KYPHOPLASTY T12, L3, L4 L5;  Surgeon: Hessie Knows, MD;  Location: [facility] ORS;  Service: Orthopedics;  Laterality: N/A;  . NM GATED MYOVIEW ([place] HX)  12/2017   Low Risk - no ischemia or infarct.  EF > 65%. no RWMA  . NM MYOVIEW ([place] HX)  11/11/2010; February 2016   a) TM STRESS----NORMAL PERFUSION ,EF 68%; b) EF 50-55%. Normal LV function. No significant ischemia or infarction.  . OPEN REDUCTION INTERNAL FIXATION (ORIF) DISTAL RADIAL FRACTURE Left 01/13/2019   Procedure: OPEN REDUCTION INTERNAL FIXATION (ORIF) DISTAL  RADIAL FRACTURE - LEFT - SLEEP APNEA;  Surgeon: Hessie Knows, MD;  Location: [facility] ORS;  Service: Orthopedics;  Laterality: Left;  . ORIF ELBOW FRACTURE Right 01/01/2017   Procedure: OPEN REDUCTION INTERNAL FIXATION (ORIF) ELBOW/OLECRANON FRACTURE;  Surgeon: Hessie Knows, MD;  Location: [facility] ORS;  Service: Orthopedics;  Laterality: Right;  . ORIF WRIST FRACTURE Right 03/19/2015   Procedure: OPEN REDUCTION INTERNAL FIXATION (ORIF) WRIST FRACTURE;  Surgeon: Hessie Knows, MD;  Location: [facility] ORS;  Service: Orthopedics;  Laterality: Right;  . THORACIC AORTA - CAROTID ANGIOGRAM  October 2007   Dr. 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 TRANSTHORACIC ECHOCARDIOGRAM  February 2016   [facility]: Normal LV function. Dilated left atrium.    Home Medications:  Allergies as of 11/23/2019      Reactions   Clonazepam Other (See Comments)   Altered mental status   Ropinirole Swelling   Other reaction(s): Hallucination   Codeine Other (See Comments)   Altered mental status.   Niacin And Related Other (See Comments)   Flushing of skin   Tramadol    Other reaction(s): Hallucination   Hydrocodone    confusion   Oxycodone Other (See Comments)   confusion confusion      Medication List       Accurate as of November 23, 2019  1:37 PM. If you have any questions, ask your nurse or doctor.        acetaminophen 500 MG tablet Commonly known as: TYLENOL Take 500-1,000 mg by mouth every 6 (six) hours as needed for mild pain or moderate  pain.   Advair Diskus 250-50 MCG/DOSE Aepb Generic drug: Fluticasone-Salmeterol INHALE ONE PUFF BY MOUTH TWICE DAILY AS DIRECTED (RINSE MOUTH AFTER USE) What changed: See the new instructions.   alendronate 70 MG tablet Commonly known as: Fosamax Take 1 tablet (70 mg total) by mouth once a week. Take with a full glass of water on an empty stomach.   Anoro Ellipta 62.5-25 MCG/INH Aepb Generic drug: umeclidinium-vilanterol Inhale into the lungs.   dexamethasone 4 MG tablet Commonly known as: DECADRON   donepezil 10 MG tablet Commonly known as: ARICEPT Take 10 mg by mouth at bedtime.   Eliquis 5 MG Tabs tablet Generic drug: apixaban TAKE 1 TABLET BY MOUTH TWICE DAILY What changed: how much to take   ezetimibe 10 MG tablet Commonly known as: ZETIA Take 1 tablet (10 mg total) by mouth daily. What changed: when to take this   finasteride 5 MG tablet Commonly known as: PROSCAR Take 1 tablet (5 mg total) by mouth daily.   fluticasone 50 MCG/ACT nasal spray Commonly  known as: FLONASE PLACE TWO SPRAYS IN BOTH NOSRTILS EVERY DAY AS NEEDED FOR ALLERGIES   lisinopril 2.5 MG tablet Commonly known as: ZESTRIL Take 1 tablet (2.5 mg total) by mouth 2 (two) times daily.   Magnesium Oxide 500 MG Tabs Take 500 mg by mouth every evening.   metoprolol tartrate 25 MG tablet Commonly known as: LOPRESSOR Take 1 tablet (25 mg total) by mouth 2 (two) times daily. *OFFICE VISIT NEEDED FOR FURTHER REFILLS*   mirabegron ER 50 MG Tb24 tablet Commonly known as: MYRBETRIQ Take 1 tablet (50 mg total) by mouth daily. What changed: when to take this   nitroGLYCERIN 0.4 MG SL tablet Commonly known as: NITROSTAT Place 1 tablet (0.4 mg total) under the tongue every 5 (five) minutes as needed for chest pain.   NON FORMULARY Swish and spit 5 mLs at bedtime. Doxycycline/dexamethasone/nystatin/diphenhydramine compounded mouth elixir   pantoprazole 40 MG tablet Commonly known as: PROTONIX TAKE ONE TABLET BY MOUTH EVERY DAY   PRESERVISION AREDS 2 PO Take 1 tablet by mouth 2 (two) times daily.   rosuvastatin 20 MG tablet Commonly known as: CRESTOR TAKE ONE TABLET BY MOUTH EVERY DAY   tamsulosin 0.4 MG Caps capsule Commonly known as: FLOMAX Take 1 capsule (0.4 mg total) by mouth every evening.   Vitamin B-12 500 MCG Subl Place 500 mcg under the tongue daily.   vitamin C 1000 MG tablet Take 1,000 mg by mouth daily.       Allergies:  Allergies  Allergen Reactions  . Clonazepam Other (See Comments)    Altered mental status  . Ropinirole Swelling    Other reaction(s): Hallucination  . Codeine Other (See Comments)    Altered mental status.  . Niacin And Related Other (See Comments)    Flushing of skin  . Tramadol     Other reaction(s): Hallucination  . Hydrocodone     confusion  . Oxycodone Other (See Comments)    confusion confusion    Family History: Family History  Problem Relation Age of Onset  . Ulcers Mother        Peptic  . Dementia Mother    . Alcohol abuse Father     Social History:  reports that he quit smoking about 22 years ago. His smoking use included cigarettes. He has never used smokeless tobacco. He reports that he does not drink alcohol or use drugs.   Physical Exam: BP 113/72   Pulse 65  Ht '5\' 10"'  (1.778 m)   Wt 200 lb (90.7 kg)   BMI 28.70 kg/m   Constitutional:  Alert and oriented, No acute distress.    Assessment & Plan:    - T1c high risk prostate cancer status post IMRT PSA drawn today.  He has an appointment with radiation oncology and October 2021 and will see him back in 1 year  - Recent UTI by history No voiding symptoms at baseline.  Repeat urinalysis ordered  - Nephrolithiasis Asymptomatic.  Follow-up KUB 1 year    Abbie Sons, MD  [facility] [address] [place], [place] [address] [phone]

## 2019-11-23 NOTE — Therapy (Signed)
[place] MAIN [facility] SERVICES [address] [place], [place], [address] Phone: [phone]   Fax:  [phone]  Physical Therapy Treatment  Patient Details  Name: [name] MRN: [id number] Date of Birth: Sep 12, 1933 Referring Provider (PT): Eulogio Ditch   Encounter Date: 11/23/2019  PT End of Session - 11/23/19 1017    Visit Number  43    Number of Visits  45    Date for PT Re-Evaluation  11/28/19    Authorization Type  3/10 PN 3/15    PT Start Time  1014    PT Stop Time  1055    PT Time Calculation (min)  41 min    Equipment Utilized During Treatment  Gait belt    Activity Tolerance  Patient tolerated treatment well    Behavior During Therapy  [facility] for tasks assessed/performed       Past Medical History:  Diagnosis Date  . AAA (abdominal aortic aneurysm) ([place])   . Arthritis   . Atherosclerotic heart disease of native coronary artery without angina pectoris 1998   - s/p CABG, occluded SVG-D1; patent LIMA-LAD, SVG-OM, SVG- RCA  . Basal cell carcinoma of eyelid 01/03/2013   2019 - R side of Nose  . Benign neoplasm of ascending colon   . Benign neoplasm of descending colon   . Cancer [facility])    Skin cancer  . Colon polyps   . COPD (chronic obstructive pulmonary disease) ([place])   . Coronary artery disease   . Dementia ([place])   . Dysrhythmia    atrial fib  . Falls frequently   . GERD (gastroesophageal reflux disease)   . Hemorrhoid 02/10/2015  . History of hiatal hernia   . History of kidney stones   . HOH (hard of hearing)   . Hypertension   . Memory loss   . Osteoporosis   . PAF (paroxysmal atrial fibrillation) ([place])   . Prostate cancer ([place])   . Prostate disorder   . Sciatic pain    Chronic  . Sleep apnea    cpap  . Temporary cerebral vascular dysfunction 02/10/2015   Had negative work-up.  July, 2012.  No medication changes, had forgot them that day, got dehydrated.   . Urinary incontinence     Past Surgical  History:  Procedure Laterality Date  . ABDOMINAL AORTIC ENDOVASCULAR STENT GRAFT Bilateral 05/18/2019   Procedure: ABDOMINAL AORTIC ENDOVASCULAR STENT GRAFT;  Surgeon: Algernon Huxley, MD;  Location: [facility] ORS;  Service: Vascular;  Laterality: Bilateral;  . ANKLE SURGERY    . Arch aortogram and carotid aortogram  06/02/2006   Dr Oneida Alar did surgery  . BASAL CELL CARCINOMA EXCISION  04/2016   Dermatology  . CARDIAC CATHETERIZATION  01/30/1998    (Dr. Linard Millers): Native LAD & mRCA CTO. 90% D1. Mod Cx. SVG-D1 CTO. SVG-RCA & SVG-OM along with LIMA-LAD patent;  LV NORMAL Fxn  . CAROTID ENDARTERECTOMY Left Oct. 29, 2007   Dr. Oneida Alar:  . CATARACT EXTRACTION W/PHACO Right 03/05/2017   Procedure: CATARACT EXTRACTION PHACO AND INTRAOCULAR LENS PLACEMENT (IOC);  Surgeon: Leandrew Koyanagi, MD;  Location: [facility] ORS;  Service: Ophthalmology;  Laterality: Right;  [place] 00:58.5 AP% 10.6 CDE 6.20 Fluid Pack lot # [id number]  . CATARACT EXTRACTION W/PHACO Left 04/15/2017   Procedure: CATARACT EXTRACTION PHACO AND INTRAOCULAR LENS PLACEMENT ([place])  Left;  Surgeon: Leandrew Koyanagi, MD;  Location: [place];  Service: Ophthalmology;  Laterality: Left;  . COLONOSCOPY WITH PROPOFOL N/A 09/15/2017  Procedure: COLONOSCOPY WITH PROPOFOL;  Surgeon: Lucilla Lame, MD;  Location: [facility] ENDOSCOPY;  Service: Endoscopy;  Laterality: N/A;  . COLONOSCOPY WITH PROPOFOL N/A 10/26/2018   Procedure: COLONOSCOPY WITH PROPOFOL;  Surgeon: Lucilla Lame, MD;  Location: [facility] ENDOSCOPY;  Service: Endoscopy;  Laterality: N/A;  . CORONARY ARTERY BYPASS GRAFT  1998   LIMA-LAD, SVG-OM, SVG-RPDA, SVG-DIAG  . CPET / MET  12/2012   Mild chronotropic incompetence - read 82% of predicted; also reduced effort; peak VO2 15.7 / 75% (did not reach Max effort) -- suggested ischemic response in last 1.5 minutes of exercise. Normal pulmonary function on PFTs but poor response to  . DOPPLER ECHOCARDIOGRAPHY  11/20/2010   EF =>55%; LV norm mild aortic  scelorosis  . ENDOVASCULAR REPAIR/STENT GRAFT N/A 05/18/2019   Procedure: ENDOVASCULAR REPAIR/STENT GRAFT;  Surgeon: Algernon Huxley, MD;  Location: [place] CV LAB;  Service: Cardiovascular;  Laterality: N/A;  . ESOPHAGOGASTRODUODENOSCOPY (EGD) WITH PROPOFOL N/A 09/15/2017   Procedure: ESOPHAGOGASTRODUODENOSCOPY (EGD) WITH PROPOFOL;  Surgeon: Lucilla Lame, MD;  Location: [facility] ENDOSCOPY;  Service: Endoscopy;  Laterality: N/A;  . FRACTURE SURGERY Right 03/19/2015   wrist  . HIATAL HERNIA REPAIR    . ILIAC ARTERY STENT  12/15/2005   PTA and direct stenting rgt and lft common iliac arteries  . KYPHOPLASTY N/A 04/05/2019   Procedure: KYPHOPLASTY T12, L3, L4 L5;  Surgeon: Hessie Knows, MD;  Location: [facility] ORS;  Service: Orthopedics;  Laterality: N/A;  . NM GATED MYOVIEW ([place] HX)  12/2017   Low Risk - no ischemia or infarct.  EF > 65%. no RWMA  . NM MYOVIEW ([place] HX)  11/11/2010; February 2016   a) TM STRESS----NORMAL PERFUSION ,EF 68%; b) EF 50-55%. Normal LV function. No significant ischemia or infarction.  . OPEN REDUCTION INTERNAL FIXATION (ORIF) DISTAL RADIAL FRACTURE Left 01/13/2019   Procedure: OPEN REDUCTION INTERNAL FIXATION (ORIF) DISTAL  RADIAL FRACTURE - LEFT - SLEEP APNEA;  Surgeon: Hessie Knows, MD;  Location: [facility] ORS;  Service: Orthopedics;  Laterality: Left;  . ORIF ELBOW FRACTURE Right 01/01/2017   Procedure: OPEN REDUCTION INTERNAL FIXATION (ORIF) ELBOW/OLECRANON FRACTURE;  Surgeon: Hessie Knows, MD;  Location: [facility] ORS;  Service: Orthopedics;  Laterality: Right;  . ORIF WRIST FRACTURE Right 03/19/2015   Procedure: OPEN REDUCTION INTERNAL FIXATION (ORIF) WRIST FRACTURE;  Surgeon: Hessie Knows, MD;  Location: [facility] ORS;  Service: Orthopedics;  Laterality: Right;  . THORACIC AORTA - CAROTID ANGIOGRAM  October 2007   Dr. 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 TRANSTHORACIC ECHOCARDIOGRAM  February  2016   [facility]: Normal LV function. Dilated left atrium.    There were no vitals filed for this visit.  Subjective Assessment - 11/23/19 1016    Subjective  Patient reports he has a kidney appointment this afternoon. Is tired today due to having some drainage problems at home. No falls or LOB since last session.    Pertinent History  Patient referred to physical therapy for balance/strength issues s/p abdominal aortic aneurysm (AAA) without rupture repair on 05/18/19 .  Patient has been seen by this therapist in the past (last seen in march of 2020) Patient had a fall on 01/08/19, MRI from [facility] dated 03/28/19 report acute compression fractures at T12, L3 and L4 as well as facet arthropathy at the lower lumbar levels and at L3-4 moderate central stenosis. Patient underwent injection on 10/6 at L4-5 and L5-S1. PMH includes AAA, arthritis, atherosclerotic heart  disease, (s/p CABG), benign neoplasm of ascending/descending colon, COPD, CAD, dementia, dysrhythmia, frequent falls, GERD, hemorrhoid, HOH, HTN, PAF, prostate cancer. Monday through Friday have 3 people come in different times from 730 to 12pm. Uses a walker occasionally now    Limitations  Walking;Standing;House hold activities;Other (comment);Lifting    How long can you sit comfortably?  20 minutes before back is painful    How long can you stand comfortably?  hour    How long can you walk comfortably?  using a walker about 30 minutes    Patient Stated Goals  patient would like to improve his balance and strenght.    Currently in Pain?  Yes    Pain Score  2     Pain Location  Back    Pain Orientation  Lower    Pain Descriptors / Indicators  Aching    Pain Type  Chronic pain    Pain Onset  More than a month ago    Pain Frequency  Intermittent           Treatment  In // bars: CGA with cueing for sequencing and body mechanics    One foot airex pad one foot 6" step, static semi tandem stance 30 second holds each position; no episodes of  instability this session  airex pad: SUE support 6" step toe taps 10x each LE  airex pad: SUE support lateral toe taps 10x each LE    6" step up/down BUE support 15x each LE     Standing 4lb ankle weight: cueing for decreasing velocity for optimal muscle recruitment -marching 12x each LE, SUE support  -abduction 12x each LE, SUE support    Ambulate 260 ft with CGA, cueing for widening Bos and arm swing; decreased heel strike and toe off with fatigue    seated : cueing for body mechanics and sequencing for optimal muscle recruitment and strengthening.    4lb ankle weight: LAQ 10x each LE  Soccer ball kicks for reaction timing x 2 minutes seated  Cross body punches in seated for core stability and pertubation training 2x 60 seconds   Adduction 12x each LE RTB  against PT resistance  RTB Around ankles alternating LE kick outs 15x each LE    RTB hamstring curls 15x each LE  Sit to stand rainbow ball press 10x, very challenging, occasional instances of rocking to get to standing.      Seated rest breaks to monitor oxygen and HR. Breathing technique reminders required.    Pt educated throughout session about proper posture and technique with exercises. Improved exercise technique, movement at target joints, use of target muscles after min to mod verbal, visual, tactile cues.                      PT Education - 11/23/19 1017    Education provided  Yes    Education Details  exercise technique, body mechanics,    Person(s) Educated  Patient    Methods  Explanation;Demonstration;Tactile cues;Verbal cues    Comprehension  Verbalized understanding;Returned demonstration;Verbal cues required;Tactile cues required       PT Short Term Goals - 10/03/19 1312      PT SHORT TERM GOAL #1   Title  Patient will be independent in home exercise program to improve strength/mobility for better functional independence with ADLs.    Baseline  10/16: HEP given 11/17: HEP  compliant    Time  4    Period  Weeks  Status  Achieved    Target Date  07/15/19      PT SHORT TERM GOAL #2   Title  Patient will deny any falls over past 4 weeks to demonstrate improved safety awareness at home and work.    Baseline  10/16: one major fall 11/17: no falls    Time  4    Period  Weeks    Status  Achieved    Target Date  07/15/19        PT Long Term Goals - 11/14/19 1100      PT LONG TERM GOAL #1   Title  Patient will increase BLE gross strength to 4+/5 as to improve functional strength for independent gait, increased standing tolerance and increased ADL ability.    Baseline  10/16: R gross 4-/5 with add/ext 3+/5 L grossly 3+/5 with hip add/ext 2+/5 11/17: R 4/5 add/ext; -/5 L 4-/5 hip add/ext 3/5 12/8: R: 4/5 L 4-/5 hip add/ext 3+/5 2/1: R 4+/5 L 4/5 with hip ext 4-/5    Time  8    Period  Weeks    Status  Partially Met    Target Date  11/28/19      PT LONG TERM GOAL #2   Title  Patient (> 55 years old) will complete five times sit to stand test in < 15 seconds indicating an increased LE strength and improved balance.    Baseline  10/16: 17 seconds with heavy use of hands on knees 11/17: 16 seconds one near LOB 12/8: 13 seconds one posterior LOB    Time  8    Period  Weeks    Status  Achieved      PT LONG TERM GOAL #3   Title  Patient will increase Berg Balance score by > 6 points (43/56)  to demonstrate decreased fall risk during functional activities.    Baseline  10/16: 37/56 11/17: 42/56 12/8: 47/56    Time  8    Period  Weeks    Status  Achieved      PT LONG TERM GOAL #4   Title  Patient will increase 10 meter walk test to >1.13ms as to improve gait speed for better community ambulation and to reduce fall risk.    Baseline  10/16: 0.91 w/o AD 11/17: 1.43 m/s with no AD    Time  8    Period  Weeks    Status  Achieved      PT LONG TERM GOAL #5   Title  Patient will increase ABC scale score >80% to demonstrate better functional mobility and better  confidence with ADLs.    Baseline  10/16; 71% 11/17: 80% 12/8: 83%    Time  8    Period  Weeks    Status  Achieved      PT LONG TERM GOAL #6   Title  Patient will improve score on DGI to > or = 19/24 on the DGI to increase dynamic balance during mobility and to decrease risk of falls.    Baseline  12/8: perform next session 12/29: 18/24 2/1: 18/24 3/15: 20/24    Time  8    Period  Weeks    Status  Achieved      PT LONG TERM GOAL #7   Title  Patient will increase Berg Balance score by > 6 points (53/56)  to demonstrate decreased fall risk during functional activities.    Baseline  12/8; 47/56 12/29: 49/56 2/1: 46/56 3/15: 47/56  Time  8    Period  Weeks    Status  Partially Met    Target Date  11/28/19            Plan - 11/23/19 1056    Clinical Impression Statement  Patient is more fatigued this session requiring additional encouragement and rest breaks. He continues to be motivated despite fatigue and is progressing with strengthening and stability interventions. Patient is interested in learning dance step next session. Patient would benefit from further skilled PT intervention to increase strength and stability for safer ADL performance and increased functional ability.    Personal Factors and Comorbidities  Age;Comorbidity 3+;Fitness;Past/Current Experience;Sex;Social Background;Time since onset of injury/illness/exacerbation;Transportation    Comorbidities  AAA, arthritis, atherosclerotic heart disease, (s/p CABG), benign neoplasm of ascending/descending colon, COPD, CAD, dementia, dysrhythmia, frequent falls, GERD, hemorrhoid, HOH, HTN, PAF, prostate cancer.    Examination-Activity Limitations  Bathing;Bend;Carry;Dressing;Continence;Stairs;Squat;Reach Overhead;Locomotion Level;Lift;Stand;Transfers;Toileting    Examination-Participation Restrictions  Church;Cleaning;Community Activity;Driving;Interpersonal Relationship;Laundry;Volunteer;Shop;Personal Finances;Meal Prep;Yard  Work    [employer] Potential  Fair    Clinical Impairments Affecting Rehab Potential  (+) good support system, high prior level of function (-) currently undergoing radiation therapy for prostate cancer, memory deficits     PT Frequency  2x / week    PT Duration  8 weeks    PT Treatment/Interventions  ADLs/Self Care Home Management;Aquatic Therapy;Cryotherapy;Moist Heat;Traction;Ultrasound;Therapeutic activities;Functional mobility training;Stair training;Gait training;DME Instruction;Therapeutic exercise;Balance training;Neuromuscular re-education;Patient/family education;Manual techniques;Passive range of motion;Energy conservation;Vestibular;Taping    PT Next Visit Plan  single limb stability    PT Home Exercise Plan  see above    Consulted and Agree with Plan of Care  Patient       Patient will benefit from skilled therapeutic intervention in order to improve the following deficits and impairments:  Abnormal gait, Cardiopulmonary status limiting activity, Decreased activity tolerance, Decreased balance, Decreased knowledge of precautions, Decreased endurance, Decreased coordination, Decreased cognition, Decreased knowledge of use of DME, Decreased mobility, Difficulty walking, Decreased safety awareness, Decreased strength, Impaired flexibility, Impaired perceived functional ability, Impaired sensation, Postural dysfunction, Improper body mechanics, Pain  Visit Diagnosis: Unsteadiness on feet  Muscle weakness (generalized)  Other abnormalities of gait and mobility     Problem List Patient Active Problem List   Diagnosis Date Noted  . Status post abdominal aortic aneurysm (AAA) repair 04/15/2019  . Candidiasis 02/22/2019  . Malignant neoplasm of descending colon ([place])   . Benign neoplasm of descending colon   . Benign neoplasm of transverse colon   . Do not intubate, cardiopulmonary resuscitation (CPR)-only code status 10/08/2018  . Prostate cancer ([place]) 02/25/2018  . Prostatic cyst  01/15/2018  . 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 ([place]) 09/15/2017  . Blood in stool   . Abnormal feces   . Stricture and stenosis of esophagus   . Mild cognitive impairment 08/18/2017  . Senile purpura ([place]) 04/29/2017  . Anemia 04/29/2017  . Frequent falls 04/29/2017  . Acute respiratory failure ([place]) 12/17/2016  . Simple chronic bronchitis ([place]) 12/17/2016  . Benign localized hyperplasia of prostate with urinary obstruction 07/15/2016  . Elevated PSA 07/15/2016  . Incomplete emptying of bladder 07/15/2016  . Urge incontinence 07/15/2016  . Hypothyroidism 04/25/2016  . Macular degeneration 04/25/2016  . Erectile dysfunction due to arterial insufficiency   . Ischemic heart disease with chronotropic incompetence   . CN (constipation) 02/10/2015  . DD (diverticular disease) 02/10/2015  . Fatigue 02/10/2015  . Lichen planus 33/38/3291  . Restless leg 02/10/2015  . 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  B12 deficiency 02/10/2015  . COPD (chronic obstructive pulmonary disease) ([place]) 11/14/2014  . Post Inflammatory Lung Changes 11/14/2014  . PAF (paroxysmal atrial fibrillation) (H[facility]) CHA2DS2-VASc = 4. AC = Eliquis   . Insomnia 12/09/2013  . OSA on CPAP   . Dyspnea on exertion - -essentially resolved with BB dose reduction & wgt loss 03/29/2013  . Overweight (BMI 25.0-29.9) -- Wgt back up 01/17/2013  . Atherosclerotic heart disease of native coronary artery without angina pectoris - s/p CABG, occluded SVG-D1; patent LIMA-LAD, SVG-OM, SVG- RCA   . Essential hypertension   . Hyperlipidemia with target LDL less than 70   . Aorto-iliac disease ([place])   . BCC (basal cell carcinoma), eyelid 01/03/2013  . Allergic rhinitis 08/17/2009  . Adaptation reaction 05/28/2009  . Acid reflux 05/28/2009  . Arthritis, degenerative 05/28/2009  . 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 Carotid artery occlusion without infarction 06/01/2006  . PAD (peripheral artery disease) - bilateral common  iliac stents 12/15/2005  . Aortoiliac occlusive disease ([place]) 11/30/2005  . Atherosclerotic heart disease of artery bypass graft 09/01/1997   Janna Arch, PT, DPT   11/23/2019, 11:02 AM  [place] MAIN [facility] SERVICES [address] [place], [place], [address] Phone: [phone]   Fax:  [phone]  Name: [name] MRN: [id number] Date of Birth: [birth year]

## 2019-11-24 ENCOUNTER — Telehealth: Payer: Self-pay | Admitting: *Deleted

## 2019-11-24 ENCOUNTER — Encounter: Payer: Self-pay | Admitting: Urology

## 2019-11-24 LAB — URINALYSIS, COMPLETE
Bilirubin, UA: NEGATIVE
Glucose, UA: NEGATIVE
Ketones, UA: NEGATIVE
Leukocytes,UA: NEGATIVE
Nitrite, UA: NEGATIVE
RBC, UA: NEGATIVE
Specific Gravity, UA: >1.03 — ABNORMAL HIGH (ref 1.005–1.030)
Urobilinogen, Ur: 0.2 mg/dL (ref 0.2–1.0)
pH, UA: 5 (ref 5.0–7.5)

## 2019-11-24 LAB — MICROSCOPIC EXAMINATION
Bacteria, UA: NONE SEEN
RBC, Urine: NONE SEEN /hpf (ref 0–2)

## 2019-11-24 LAB — PSA: Prostate Specific Ag, Serum: <0.1 ng/mL (ref 0.0–4.0)

## 2019-11-24 NOTE — Telephone Encounter (Addendum)
Patient informed, voiced understanding.   ----- Message from Chrystie Nose, [place] sent at 11/24/2019  2:03 PM EDT -----  ----- Message ----- From: Abbie Sons, MD Sent: 11/24/2019   1:41 PM EDT To: Chrystie Nose, CMA  Urinalysis showed resolution of infection and PSA undetectable at <0.1

## 2019-11-28 ENCOUNTER — Ambulatory Visit: Payer: Medicare Other

## 2019-11-28 ENCOUNTER — Other Ambulatory Visit: Payer: Self-pay

## 2019-11-28 DIAGNOSIS — M6281 Muscle weakness (generalized): Secondary | ICD-10-CM

## 2019-11-28 DIAGNOSIS — R2681 Unsteadiness on feet: Secondary | ICD-10-CM | POA: Diagnosis not present

## 2019-11-28 DIAGNOSIS — R2689 Other abnormalities of gait and mobility: Secondary | ICD-10-CM

## 2019-11-28 NOTE — Therapy (Signed)
[place] MAIN [facility] SERVICES [address] [place], [place], [address] Phone: [phone]   Fax:  [phone]  Physical Therapy Treatment/ RECERT  Patient Details  Name: [name] MRN: [id number] Date of Birth: 1934/07/06 Referring Provider (PT): Eulogio Ditch   Encounter Date: 11/28/2019  PT End of Session - 11/28/19 1641    Visit Number  44    Number of Visits  60    Date for PT Re-Evaluation  01/23/20    Authorization Type  4/10 PN 3/15    PT Start Time  1015    PT Stop Time  1059    PT Time Calculation (min)  44 min    Equipment Utilized During Treatment  Gait belt    Activity Tolerance  Patient tolerated treatment well    Behavior During Therapy  [facility] for tasks assessed/performed       Past Medical History:  Diagnosis Date  . AAA (abdominal aortic aneurysm) ([place])   . Arthritis   . Atherosclerotic heart disease of native coronary artery without angina pectoris 1998   - s/p CABG, occluded SVG-D1; patent LIMA-LAD, SVG-OM, SVG- RCA  . Basal cell carcinoma of eyelid 01/03/2013   2019 - R side of Nose  . Benign neoplasm of ascending colon   . Benign neoplasm of descending colon   . Cancer [facility])    Skin cancer  . Colon polyps   . COPD (chronic obstructive pulmonary disease) ([place])   . Coronary artery disease   . Dementia ([place])   . Dysrhythmia    atrial fib  . Falls frequently   . GERD (gastroesophageal reflux disease)   . Hemorrhoid 02/10/2015  . History of hiatal hernia   . History of kidney stones   . HOH (hard of hearing)   . Hypertension   . Memory loss   . Osteoporosis   . PAF (paroxysmal atrial fibrillation) ([place])   . Prostate cancer ([place])   . Prostate disorder   . Sciatic pain    Chronic  . Sleep apnea    cpap  . Temporary cerebral vascular dysfunction 02/10/2015   Had negative work-up.  July, 2012.  No medication changes, had forgot them that day, got dehydrated.   . Urinary incontinence     Past  Surgical History:  Procedure Laterality Date  . ABDOMINAL AORTIC ENDOVASCULAR STENT GRAFT Bilateral 05/18/2019   Procedure: ABDOMINAL AORTIC ENDOVASCULAR STENT GRAFT;  Surgeon: Algernon Huxley, MD;  Location: [facility] ORS;  Service: Vascular;  Laterality: Bilateral;  . ANKLE SURGERY    . Arch aortogram and carotid aortogram  06/02/2006   Dr Oneida Alar did surgery  . BASAL CELL CARCINOMA EXCISION  04/2016   Dermatology  . CARDIAC CATHETERIZATION  01/30/1998    (Dr. Linard Millers): Native LAD & mRCA CTO. 90% D1. Mod Cx. SVG-D1 CTO. SVG-RCA & SVG-OM along with LIMA-LAD patent;  LV NORMAL Fxn  . CAROTID ENDARTERECTOMY Left Oct. 29, 2007   Dr. Oneida Alar:  . CATARACT EXTRACTION W/PHACO Right 03/05/2017   Procedure: CATARACT EXTRACTION PHACO AND INTRAOCULAR LENS PLACEMENT (IOC);  Surgeon: Leandrew Koyanagi, MD;  Location: [facility] ORS;  Service: Ophthalmology;  Laterality: Right;  [place] 00:58.5 AP% 10.6 CDE 6.20 Fluid Pack lot # [id number]  . CATARACT EXTRACTION W/PHACO Left 04/15/2017   Procedure: CATARACT EXTRACTION PHACO AND INTRAOCULAR LENS PLACEMENT ([place])  Left;  Surgeon: Leandrew Koyanagi, MD;  Location: [place];  Service: Ophthalmology;  Laterality: Left;  . COLONOSCOPY WITH PROPOFOL N/A 09/15/2017  Procedure: COLONOSCOPY WITH PROPOFOL;  Surgeon: Lucilla Lame, MD;  Location: [facility] ENDOSCOPY;  Service: Endoscopy;  Laterality: N/A;  . COLONOSCOPY WITH PROPOFOL N/A 10/26/2018   Procedure: COLONOSCOPY WITH PROPOFOL;  Surgeon: Lucilla Lame, MD;  Location: [facility] ENDOSCOPY;  Service: Endoscopy;  Laterality: N/A;  . CORONARY ARTERY BYPASS GRAFT  1998   LIMA-LAD, SVG-OM, SVG-RPDA, SVG-DIAG  . CPET / MET  12/2012   Mild chronotropic incompetence - read 82% of predicted; also reduced effort; peak VO2 15.7 / 75% (did not reach Max effort) -- suggested ischemic response in last 1.5 minutes of exercise. Normal pulmonary function on PFTs but poor response to  . DOPPLER ECHOCARDIOGRAPHY  11/20/2010   EF =>55%; LV norm  mild aortic scelorosis  . ENDOVASCULAR REPAIR/STENT GRAFT N/A 05/18/2019   Procedure: ENDOVASCULAR REPAIR/STENT GRAFT;  Surgeon: Algernon Huxley, MD;  Location: [place] CV LAB;  Service: Cardiovascular;  Laterality: N/A;  . ESOPHAGOGASTRODUODENOSCOPY (EGD) WITH PROPOFOL N/A 09/15/2017   Procedure: ESOPHAGOGASTRODUODENOSCOPY (EGD) WITH PROPOFOL;  Surgeon: Lucilla Lame, MD;  Location: [facility] ENDOSCOPY;  Service: Endoscopy;  Laterality: N/A;  . FRACTURE SURGERY Right 03/19/2015   wrist  . HIATAL HERNIA REPAIR    . ILIAC ARTERY STENT  12/15/2005   PTA and direct stenting rgt and lft common iliac arteries  . KYPHOPLASTY N/A 04/05/2019   Procedure: KYPHOPLASTY T12, L3, L4 L5;  Surgeon: Hessie Knows, MD;  Location: [facility] ORS;  Service: Orthopedics;  Laterality: N/A;  . NM GATED MYOVIEW ([place] HX)  12/2017   Low Risk - no ischemia or infarct.  EF > 65%. no RWMA  . NM MYOVIEW ([place] HX)  11/11/2010; February 2016   a) TM STRESS----NORMAL PERFUSION ,EF 68%; b) EF 50-55%. Normal LV function. No significant ischemia or infarction.  . OPEN REDUCTION INTERNAL FIXATION (ORIF) DISTAL RADIAL FRACTURE Left 01/13/2019   Procedure: OPEN REDUCTION INTERNAL FIXATION (ORIF) DISTAL  RADIAL FRACTURE - LEFT - SLEEP APNEA;  Surgeon: Hessie Knows, MD;  Location: [facility] ORS;  Service: Orthopedics;  Laterality: Left;  . ORIF ELBOW FRACTURE Right 01/01/2017   Procedure: OPEN REDUCTION INTERNAL FIXATION (ORIF) ELBOW/OLECRANON FRACTURE;  Surgeon: Hessie Knows, MD;  Location: [facility] ORS;  Service: Orthopedics;  Laterality: Right;  . ORIF WRIST FRACTURE Right 03/19/2015   Procedure: OPEN REDUCTION INTERNAL FIXATION (ORIF) WRIST FRACTURE;  Surgeon: Hessie Knows, MD;  Location: [facility] ORS;  Service: Orthopedics;  Laterality: Right;  . THORACIC AORTA - CAROTID ANGIOGRAM  October 2007   Dr. 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 TRANSTHORACIC  ECHOCARDIOGRAM  February 2016   [facility]: Normal LV function. Dilated left atrium.    There were no vitals filed for this visit.  Subjective Assessment - 11/28/19 1640    Subjective  Patient reports the doctor had good news last week. No falls or LOB since last session. Want's to continue PT and work on balance and walking endurance.    Pertinent History  Patient referred to physical therapy for balance/strength issues s/p abdominal aortic aneurysm (AAA) without rupture repair on 05/18/19 .  Patient has been seen by this therapist in the past (last seen in march of 2020) Patient had a fall on 01/08/19, MRI from [facility] dated 03/28/19 report acute compression fractures at T12, L3 and L4 as well as facet arthropathy at the lower lumbar levels and at L3-4 moderate central stenosis. Patient underwent injection on 10/6 at L4-5 and L5-S1. PMH includes AAA, arthritis, atherosclerotic heart  disease, (s/p CABG), benign neoplasm of ascending/descending colon, COPD, CAD, dementia, dysrhythmia, frequent falls, GERD, hemorrhoid, HOH, HTN, PAF, prostate cancer. Monday through Friday have 3 people come in different times from 730 to 12pm. Uses a walker occasionally now    Limitations  Walking;Standing;House hold activities;Other (comment);Lifting    How long can you sit comfortably?  20 minutes before back is painful    How long can you stand comfortably?  hour    How long can you walk comfortably?  using a walker about 30 minutes    Patient Stated Goals  patient would like to improve his balance and strenght.    Currently in Pain?  No/denies          New goal:  6 MWT: 756 ft. L trendelenburg gait by end walk duration   Treatment:  Nustep Lvl 3 RPM>60 for cardiovascular challenge.   step training, cues for hand placement in line with the steps for optimal trunk alignment x 5 trials    Single limb stance, BUE support hold position 30 seconds each LE  Squat with BUE support, cueing for body mechanics, foot  placement, depth of squat and posterior chain activation   Standing heel toe raises 20x with close CGA due to posterior trunk lean.   Seated: GTB abduction 15x ; cueing for upright posture   Patient requests continued focus on: Balance, steps,  and walking,       Pt educated throughout session about proper posture and technique with exercises. Improved exercise technique, movement at target joints, use of target muscles after min to mod verbal, visual, tactile cues.   Goals performed 3/15, please refer to this note for further details on goals met and progressed. Additional goal of 6 minute walk test added due to patient progression in functional mobility. As patient fatigued a trendelenburg gait pattern emerged with slight antalgic additions as well. Patient would benefit from further skilled PT intervention to increase strength and stability for safer ADL performance and increased functional ability.            PT Education - 11/28/19 1640    Education provided  Yes    Education Details  goals, POC, exercise technique    Person(s) Educated  Patient    Methods  Explanation;Demonstration;Tactile cues;Verbal cues    Comprehension  Verbalized understanding;Returned demonstration;Verbal cues required;Tactile cues required       PT Short Term Goals - 10/03/19 1312      PT SHORT TERM GOAL #1   Title  Patient will be independent in home exercise program to improve strength/mobility for better functional independence with ADLs.    Baseline  10/16: HEP given 11/17: HEP compliant    Time  4    Period  Weeks    Status  Achieved    Target Date  07/15/19      PT SHORT TERM GOAL #2   Title  Patient will deny any falls over past 4 weeks to demonstrate improved safety awareness at home and work.    Baseline  10/16: one major fall 11/17: no falls    Time  4    Period  Weeks    Status  Achieved    Target Date  07/15/19        PT Long Term Goals - 11/28/19 1642      PT LONG TERM  GOAL #1   Title  Patient will increase BLE gross strength to 4+/5 as to improve functional strength for independent gait, increased  standing tolerance and increased ADL ability.    Baseline  10/16: R gross 4-/5 with add/ext 3+/5 L grossly 3+/5 with hip add/ext 2+/5 11/17: R 4/5 add/ext; -/5 L 4-/5 hip add/ext 3/5 12/8: R: 4/5 L 4-/5 hip add/ext 3+/5 2/1: R 4+/5 L 4/5 with hip ext 4-/5    Time  8    Period  Weeks    Status  Partially Met    Target Date  01/23/20      PT LONG TERM GOAL #2   Title  Patient (> 72 years old) will complete five times sit to stand test in < 15 seconds indicating an increased LE strength and improved balance.    Baseline  10/16: 17 seconds with heavy use of hands on knees 11/17: 16 seconds one near LOB 12/8: 13 seconds one posterior LOB    Time  8    Period  Weeks    Status  Achieved      PT LONG TERM GOAL #3   Title  Patient will increase Berg Balance score by > 6 points (43/56)  to demonstrate decreased fall risk during functional activities.    Baseline  10/16: 37/56 11/17: 42/56 12/8: 47/56    Time  8    Period  Weeks    Status  Achieved      PT LONG TERM GOAL #4   Title  Patient will increase 10 meter walk test to >1.2ms as to improve gait speed for better community ambulation and to reduce fall risk.    Baseline  10/16: 0.91 w/o AD 11/17: 1.43 m/s with no AD    Time  8    Period  Weeks    Status  Achieved      PT LONG TERM GOAL #5   Title  Patient will increase ABC scale score >80% to demonstrate better functional mobility and better confidence with ADLs.    Baseline  10/16; 71% 11/17: 80% 12/8: 83%    Time  8    Period  Weeks    Status  Achieved      Additional Long Term Goals   Additional Long Term Goals  Yes      PT LONG TERM GOAL #6   Title  Patient will improve score on DGI to > or = 19/24 on the DGI to increase dynamic balance during mobility and to decrease risk of falls.    Baseline  12/8: perform next session 12/29: 18/24 2/1: 18/24  3/15: 20/24    Time  8    Period  Weeks    Status  Achieved      PT LONG TERM GOAL #7   Title  Patient will increase Berg Balance score by > 6 points (53/56)  to demonstrate decreased fall risk during functional activities.    Baseline  12/8; 47/56 12/29: 49/56 2/1: 46/56 3/15: 47/56    Time  8    Period  Weeks    Status  Partially Met    Target Date  01/23/20      PT LONG TERM GOAL #8   Title  Patient will increase six minute walk test distance to >1000 for progression to community ambulator and improve gait ability    Baseline  3/29: 756 ft    Time  8    Period  Weeks    Status  New    Target Date  01/23/20            Plan - 11/28/19 1647  Clinical Impression Statement  Goals performed 3/15, please refer to this note for further details on goals met and progressed. Additional goal of 6 minute walk test added due to patient progression in functional mobility. As patient fatigued a trendelenburg gait pattern emerged with slight antalgic additions as well. Patient would benefit from further skilled PT intervention to increase strength and stability for safer ADL performance and increased functional ability.    Personal Factors and Comorbidities  Age;Comorbidity 3+;Fitness;Past/Current Experience;Sex;Social Background;Time since onset of injury/illness/exacerbation;Transportation    Comorbidities  AAA, arthritis, atherosclerotic heart disease, (s/p CABG), benign neoplasm of ascending/descending colon, COPD, CAD, dementia, dysrhythmia, frequent falls, GERD, hemorrhoid, HOH, HTN, PAF, prostate cancer.    Examination-Activity Limitations  Bathing;Bend;Carry;Dressing;Continence;Stairs;Squat;Reach Overhead;Locomotion Level;Lift;Stand;Transfers;Toileting    Examination-Participation Restrictions  Church;Cleaning;Community Activity;Driving;Interpersonal Relationship;Laundry;Volunteer;Shop;Personal Finances;Meal Prep;Yard Work    [employer] Potential  Fair    Clinical Impairments Affecting  Rehab Potential  (+) good support system, high prior level of function (-) currently undergoing radiation therapy for prostate cancer, memory deficits     PT Frequency  2x / week    PT Duration  8 weeks    PT Treatment/Interventions  ADLs/Self Care Home Management;Aquatic Therapy;Cryotherapy;Moist Heat;Traction;Ultrasound;Therapeutic activities;Functional mobility training;Stair training;Gait training;DME Instruction;Therapeutic exercise;Balance training;Neuromuscular re-education;Patient/family education;Manual techniques;Passive range of motion;Energy conservation;Vestibular;Taping    PT Next Visit Plan  single limb stability    PT Home Exercise Plan  see above    Consulted and Agree with Plan of Care  Patient       Patient will benefit from skilled therapeutic intervention in order to improve the following deficits and impairments:  Abnormal gait, Cardiopulmonary status limiting activity, Decreased activity tolerance, Decreased balance, Decreased knowledge of precautions, Decreased endurance, Decreased coordination, Decreased cognition, Decreased knowledge of use of DME, Decreased mobility, Difficulty walking, Decreased safety awareness, Decreased strength, Impaired flexibility, Impaired perceived functional ability, Impaired sensation, Postural dysfunction, Improper body mechanics, Pain  Visit Diagnosis: Unsteadiness on feet  Muscle weakness (generalized)  Other abnormalities of gait and mobility     Problem List Patient Active Problem List   Diagnosis Date Noted  . Status post abdominal aortic aneurysm (AAA) repair 04/15/2019  . Candidiasis 02/22/2019  . Malignant neoplasm of descending colon ([place])   . Benign neoplasm of descending colon   . Benign neoplasm of transverse colon   . Do not intubate, cardiopulmonary resuscitation (CPR)-only code status 10/08/2018  . Prostate cancer ([place]) 02/25/2018  . Prostatic cyst 01/15/2018  . 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 ([place]) 09/15/2017  .  Blood in stool   . Abnormal feces   . Stricture and stenosis of esophagus   . Mild cognitive impairment 08/18/2017  . Senile purpura ([place]) 04/29/2017  . Anemia 04/29/2017  . Frequent falls 04/29/2017  . Acute respiratory failure ([place]) 12/17/2016  . Simple chronic bronchitis ([place]) 12/17/2016  . Benign localized hyperplasia of prostate with urinary obstruction 07/15/2016  . Elevated PSA 07/15/2016  . Incomplete emptying of bladder 07/15/2016  . Urge incontinence 07/15/2016  . Hypothyroidism 04/25/2016  . Macular degeneration 04/25/2016  . Erectile dysfunction due to arterial insufficiency   . Ischemic heart disease with chronotropic incompetence   . CN (constipation) 02/10/2015  . DD (diverticular disease) 02/10/2015  . Fatigue 02/10/2015  . Lichen planus 41/74/0814  . Restless leg 02/10/2015  . 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 B12 deficiency 02/10/2015  . COPD (chronic obstructive pulmonary disease) ([place]) 11/14/2014  . Post Inflammatory Lung Changes 11/14/2014  . PAF (paroxysmal atrial fibrillation) ([facility]) CHA2DS2-VASc = 4. AC = Eliquis   .  Insomnia 12/09/2013  . OSA on CPAP   . Dyspnea on exertion - -essentially resolved with BB dose reduction & wgt loss 03/29/2013  . Overweight (BMI 25.0-29.9) -- Wgt back up 01/17/2013  . Atherosclerotic heart disease of native coronary artery without angina pectoris - s/p CABG, occluded SVG-D1; patent LIMA-LAD, SVG-OM, SVG- RCA   . Essential hypertension   . Hyperlipidemia with target LDL less than 70   . Aorto-iliac disease ([place])   . BCC (basal cell carcinoma), eyelid 01/03/2013  . Allergic rhinitis 08/17/2009  . Adaptation reaction 05/28/2009  . Acid reflux 05/28/2009  . Arthritis, degenerative 05/28/2009  . 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 Carotid artery occlusion without infarction 06/01/2006  . PAD (peripheral artery disease) - bilateral common iliac stents 12/15/2005  . Aortoiliac occlusive disease ([place])  11/30/2005  . Atherosclerotic heart disease of artery bypass graft 09/01/1997   Janna Arch, PT, DPT   11/28/2019, 4:48 PM  [place] MAIN [facility] SERVICES [address] [place], [place], [address] Phone: [phone]   Fax:  [phone]  Name: [name] MRN: [id number] Date of Birth: Nov 30, 1933

## 2019-11-30 ENCOUNTER — Other Ambulatory Visit: Payer: Self-pay

## 2019-11-30 ENCOUNTER — Ambulatory Visit: Payer: Medicare Other

## 2019-11-30 DIAGNOSIS — M6281 Muscle weakness (generalized): Secondary | ICD-10-CM

## 2019-11-30 DIAGNOSIS — R2689 Other abnormalities of gait and mobility: Secondary | ICD-10-CM

## 2019-11-30 DIAGNOSIS — R2681 Unsteadiness on feet: Secondary | ICD-10-CM | POA: Diagnosis not present

## 2019-11-30 NOTE — Therapy (Signed)
[place] MAIN [facility] SERVICES [address] [place], [place], [address] Phone: [phone]   Fax:  [phone]  Physical Therapy Treatment  Patient Details  Name: [name] MRN: [id number] Date of Birth: August 02, 1934 Referring Provider (PT): Eulogio Ditch   Encounter Date: 11/30/2019  PT End of Session - 11/30/19 1230    Visit Number  45    Number of Visits  60    Date for PT Re-Evaluation  01/23/20    Authorization Type  5/10 PN 3/15    PT Start Time  1015    PT Stop Time  1058    PT Time Calculation (min)  43 min    Equipment Utilized During Treatment  Gait belt    Activity Tolerance  Patient tolerated treatment well    Behavior During Therapy  [facility] for tasks assessed/performed       Past Medical History:  Diagnosis Date  . AAA (abdominal aortic aneurysm) ([place])   . Arthritis   . Atherosclerotic heart disease of native coronary artery without angina pectoris 1998   - s/p CABG, occluded SVG-D1; patent LIMA-LAD, SVG-OM, SVG- RCA  . Basal cell carcinoma of eyelid 01/03/2013   2019 - R side of Nose  . Benign neoplasm of ascending colon   . Benign neoplasm of descending colon   . Cancer [facility])    Skin cancer  . Colon polyps   . COPD (chronic obstructive pulmonary disease) ([place])   . Coronary artery disease   . Dementia ([place])   . Dysrhythmia    atrial fib  . Falls frequently   . GERD (gastroesophageal reflux disease)   . Hemorrhoid 02/10/2015  . History of hiatal hernia   . History of kidney stones   . HOH (hard of hearing)   . Hypertension   . Memory loss   . Osteoporosis   . PAF (paroxysmal atrial fibrillation) ([place])   . Prostate cancer ([place])   . Prostate disorder   . Sciatic pain    Chronic  . Sleep apnea    cpap  . Temporary cerebral vascular dysfunction 02/10/2015   Had negative work-up.  July, 2012.  No medication changes, had forgot them that day, got dehydrated.   . Urinary incontinence     Past Surgical  History:  Procedure Laterality Date  . ABDOMINAL AORTIC ENDOVASCULAR STENT GRAFT Bilateral 05/18/2019   Procedure: ABDOMINAL AORTIC ENDOVASCULAR STENT GRAFT;  Surgeon: Algernon Huxley, MD;  Location: [facility] ORS;  Service: Vascular;  Laterality: Bilateral;  . ANKLE SURGERY    . Arch aortogram and carotid aortogram  06/02/2006   Dr Oneida Alar did surgery  . BASAL CELL CARCINOMA EXCISION  04/2016   Dermatology  . CARDIAC CATHETERIZATION  01/30/1998    (Dr. Linard Millers): Native LAD & mRCA CTO. 90% D1. Mod Cx. SVG-D1 CTO. SVG-RCA & SVG-OM along with LIMA-LAD patent;  LV NORMAL Fxn  . CAROTID ENDARTERECTOMY Left Oct. 29, 2007   Dr. Oneida Alar:  . CATARACT EXTRACTION W/PHACO Right 03/05/2017   Procedure: CATARACT EXTRACTION PHACO AND INTRAOCULAR LENS PLACEMENT (IOC);  Surgeon: Leandrew Koyanagi, MD;  Location: [facility] ORS;  Service: Ophthalmology;  Laterality: Right;  [place] 00:58.5 AP% 10.6 CDE 6.20 Fluid Pack lot # [id number]  . CATARACT EXTRACTION W/PHACO Left 04/15/2017   Procedure: CATARACT EXTRACTION PHACO AND INTRAOCULAR LENS PLACEMENT ([place])  Left;  Surgeon: Leandrew Koyanagi, MD;  Location: [place];  Service: Ophthalmology;  Laterality: Left;  . COLONOSCOPY WITH PROPOFOL N/A 09/15/2017  Procedure: COLONOSCOPY WITH PROPOFOL;  Surgeon: Lucilla Lame, MD;  Location: [facility] ENDOSCOPY;  Service: Endoscopy;  Laterality: N/A;  . COLONOSCOPY WITH PROPOFOL N/A 10/26/2018   Procedure: COLONOSCOPY WITH PROPOFOL;  Surgeon: Lucilla Lame, MD;  Location: [facility] ENDOSCOPY;  Service: Endoscopy;  Laterality: N/A;  . CORONARY ARTERY BYPASS GRAFT  1998   LIMA-LAD, SVG-OM, SVG-RPDA, SVG-DIAG  . CPET / MET  12/2012   Mild chronotropic incompetence - read 82% of predicted; also reduced effort; peak VO2 15.7 / 75% (did not reach Max effort) -- suggested ischemic response in last 1.5 minutes of exercise. Normal pulmonary function on PFTs but poor response to  . DOPPLER ECHOCARDIOGRAPHY  11/20/2010   EF =>55%; LV norm mild aortic  scelorosis  . ENDOVASCULAR REPAIR/STENT GRAFT N/A 05/18/2019   Procedure: ENDOVASCULAR REPAIR/STENT GRAFT;  Surgeon: Algernon Huxley, MD;  Location: [place] [facility];  Service: Cardiovascular;  Laterality: N/A;  . ESOPHAGOGASTRODUODENOSCOPY (EGD) WITH PROPOFOL N/A 09/15/2017   Procedure: ESOPHAGOGASTRODUODENOSCOPY (EGD) WITH PROPOFOL;  Surgeon: Lucilla Lame, MD;  Location: [facility] ENDOSCOPY;  Service: Endoscopy;  Laterality: N/A;  . FRACTURE SURGERY Right 03/19/2015   wrist  . HIATAL HERNIA REPAIR    . ILIAC ARTERY STENT  12/15/2005   PTA and direct stenting rgt and lft common iliac arteries  . KYPHOPLASTY N/A 04/05/2019   Procedure: KYPHOPLASTY T12, L3, L4 L5;  Surgeon: Hessie Knows, MD;  Location: [facility] ORS;  Service: Orthopedics;  Laterality: N/A;  . NM GATED MYOVIEW ([place] HX)  12/2017   Low Risk - no ischemia or infarct.  EF > 65%. no RWMA  . NM MYOVIEW ([place] HX)  11/11/2010; February 2016   a) TM STRESS----NORMAL PERFUSION ,EF 68%; b) EF 50-55%. Normal LV function. No significant ischemia or infarction.  . OPEN REDUCTION INTERNAL FIXATION (ORIF) DISTAL RADIAL FRACTURE Left 01/13/2019   Procedure: OPEN REDUCTION INTERNAL FIXATION (ORIF) DISTAL  RADIAL FRACTURE - LEFT - SLEEP APNEA;  Surgeon: Hessie Knows, MD;  Location: [facility] ORS;  Service: Orthopedics;  Laterality: Left;  . ORIF ELBOW FRACTURE Right 01/01/2017   Procedure: OPEN REDUCTION INTERNAL FIXATION (ORIF) ELBOW/OLECRANON FRACTURE;  Surgeon: Hessie Knows, MD;  Location: [facility] ORS;  Service: Orthopedics;  Laterality: Right;  . ORIF WRIST FRACTURE Right 03/19/2015   Procedure: OPEN REDUCTION INTERNAL FIXATION (ORIF) WRIST FRACTURE;  Surgeon: Hessie Knows, MD;  Location: [facility] ORS;  Service: Orthopedics;  Laterality: Right;  . THORACIC AORTA - CAROTID ANGIOGRAM  October 2007   Dr. 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 TRANSTHORACIC ECHOCARDIOGRAM  February  2016   [facility]: Normal LV function. Dilated left atrium.    There were no vitals filed for this visit.  Subjective Assessment - 11/30/19 1017    Subjective  Patient reports he was feeling "rough" yesterday due to having a cold. Is better today just drowsy from his medications. No falls or LOB with medication.    Pertinent History  Patient referred to physical therapy for balance/strength issues s/p abdominal aortic aneurysm (AAA) without rupture repair on 05/18/19 .  Patient has been seen by this therapist in the past (last seen in march of 2020) Patient had a fall on 01/08/19, MRI from [facility] dated 03/28/19 report acute compression fractures at T12, L3 and L4 as well as facet arthropathy at the lower lumbar levels and at L3-4 moderate central stenosis. Patient underwent injection on 10/6 at L4-5 and L5-S1. PMH includes AAA, arthritis, atherosclerotic heart disease, (  s/p CABG), benign neoplasm of ascending/descending colon, COPD, CAD, dementia, dysrhythmia, frequent falls, GERD, hemorrhoid, HOH, HTN, PAF, prostate cancer. Monday through Friday have 3 people come in different times from 730 to 12pm. Uses a walker occasionally now    Limitations  Walking;Standing;House hold activities;Other (comment);Lifting    How long can you sit comfortably?  20 minutes before back is painful    How long can you stand comfortably?  hour    How long can you walk comfortably?  using a walker about 30 minutes    Patient Stated Goals  patient would like to improve his balance and strenght.    Currently in Pain?  Yes    Pain Score  2     Pain Location  Back    Pain Orientation  Lower    Pain Descriptors / Indicators  Aching    Pain Type  Chronic pain    Pain Onset  More than a month ago    Pain Frequency  Intermittent              Treatment  In // bars: CGA with cueing for sequencing and body mechanics    One foot airex pad one foot 6" step, static semi tandem stance 30 second holds each position; no episodes of  instability this session  Lateral step up down airex pads sandwiching 6" step, UE support, close CGA 10x each direction    airex pad: SUE support 6" step toe taps 10x each LE   airex pad: SUE support lateral toe taps 10x each LE   airex pad: tossing balls at a target for pertubations x20 balls,   Bosu ball modified lunges SUE support 12x each LE   grapevine 12x length of // bars; inclusion of heel taps intermixed with decreasing UE support    Standing 4lb ankle weight: cueing for decreasing velocity for optimal muscle recruitment -marching 15x each LE, SUE support  -abduction 15x each LE, SUE support   -hip extension 15x each LE    seated : cueing for body mechanics and sequencing for optimal muscle recruitment and strengthening.        Cross body punches in seated for core stability and pertubation training 2x 60 seconds    Adduction 12x each LE RTB  against PT resistance   RTB Around ankles alternating LE kick outs 15x each LE    RTB around ankles ER/IR 15x each LE,      Seated rest breaks to monitor oxygen and HR. Breathing technique reminders required.    Pt educated throughout session about proper posture and technique with exercises. Improved exercise technique, movement at target joints, use of target muscles after min to mod verbal, visual, tactile cues.                    PT Education - 11/30/19 1019    Education provided  Yes    Education Details  exercise technique, body mechanics    Person(s) Educated  Patient    Methods  Explanation;Demonstration;Tactile cues;Verbal cues    Comprehension  Verbalized understanding;Returned demonstration;Verbal cues required;Tactile cues required       PT Short Term Goals - 10/03/19 1312      PT SHORT TERM GOAL #1   Title  Patient will be independent in home exercise program to improve strength/mobility for better functional independence with ADLs.    Baseline  10/16: HEP given 11/17: HEP compliant     Time  4    Period  Weeks    Status  Achieved    Target Date  07/15/19      PT SHORT TERM GOAL #2   Title  Patient will deny any falls over past 4 weeks to demonstrate improved safety awareness at home and work.    Baseline  10/16: one major fall 11/17: no falls    Time  4    Period  Weeks    Status  Achieved    Target Date  07/15/19        PT Long Term Goals - 11/28/19 1642      PT LONG TERM GOAL #1   Title  Patient will increase BLE gross strength to 4+/5 as to improve functional strength for independent gait, increased standing tolerance and increased ADL ability.    Baseline  10/16: R gross 4-/5 with add/ext 3+/5 L grossly 3+/5 with hip add/ext 2+/5 11/17: R 4/5 add/ext; -/5 L 4-/5 hip add/ext 3/5 12/8: R: 4/5 L 4-/5 hip add/ext 3+/5 2/1: R 4+/5 L 4/5 with hip ext 4-/5    Time  8    Period  Weeks    Status  Partially Met    Target Date  01/23/20      PT LONG TERM GOAL #2   Title  Patient (> 61 years old) will complete five times sit to stand test in < 15 seconds indicating an increased LE strength and improved balance.    Baseline  10/16: 17 seconds with heavy use of hands on knees 11/17: 16 seconds one near LOB 12/8: 13 seconds one posterior LOB    Time  8    Period  Weeks    Status  Achieved      PT LONG TERM GOAL #3   Title  Patient will increase Berg Balance score by > 6 points (43/56)  to demonstrate decreased fall risk during functional activities.    Baseline  10/16: 37/56 11/17: 42/56 12/8: 47/56    Time  8    Period  Weeks    Status  Achieved      PT LONG TERM GOAL #4   Title  Patient will increase 10 meter walk test to >1.95ms as to improve gait speed for better community ambulation and to reduce fall risk.    Baseline  10/16: 0.91 w/o AD 11/17: 1.43 m/s with no AD    Time  8    Period  Weeks    Status  Achieved      PT LONG TERM GOAL #5   Title  Patient will increase ABC scale score >80% to demonstrate better functional mobility and better confidence with  ADLs.    Baseline  10/16; 71% 11/17: 80% 12/8: 83%    Time  8    Period  Weeks    Status  Achieved      Additional Long Term Goals   Additional Long Term Goals  Yes      PT LONG TERM GOAL #6   Title  Patient will improve score on DGI to > or = 19/24 on the DGI to increase dynamic balance during mobility and to decrease risk of falls.    Baseline  12/8: perform next session 12/29: 18/24 2/1: 18/24 3/15: 20/24    Time  8    Period  Weeks    Status  Achieved      PT LONG TERM GOAL #7   Title  Patient will increase Berg Balance score by > 6 points (53/56)  to demonstrate decreased fall risk during functional activities.    Baseline  12/8; 47/56 12/29: 49/56 2/1: 46/56 3/15: 47/56    Time  8    Period  Weeks    Status  Partially Met    Target Date  01/23/20      PT LONG TERM GOAL #8   Title  Patient will increase six minute walk test distance to >1000 for progression to community ambulator and improve gait ability    Baseline  3/29: 756 ft    Time  8    Period  Weeks    Status  New    Target Date  01/23/20            Plan - 11/30/19 1240    Clinical Impression Statement  Patient presents with good motivation despite fatigue from cold medication. Requires occasional rest breaks to return to normal breathing due to more frequent episodes of being out of breathe. Increased ability to perform standing strengthening and stability interventions with decreased episodes of instability. Patient would benefit from further skilled PT intervention to increase strength and stability for safer ADL performance and increased functional ability.Patient would benefit from further skilled PT intervention to increase strength and stability for safer ADL performance and increased functional ability.    Personal Factors and Comorbidities  Age;Comorbidity 3+;Fitness;Past/Current Experience;Sex;Social Background;Time since onset of injury/illness/exacerbation;Transportation    Comorbidities  AAA,  arthritis, atherosclerotic heart disease, (s/p CABG), benign neoplasm of ascending/descending colon, COPD, CAD, dementia, dysrhythmia, frequent falls, GERD, hemorrhoid, HOH, HTN, PAF, prostate cancer.    Examination-Activity Limitations  Bathing;Bend;Carry;Dressing;Continence;Stairs;Squat;Reach Overhead;Locomotion Level;Lift;Stand;Transfers;Toileting    Examination-Participation Restrictions  Church;Cleaning;Community Activity;Driving;Interpersonal Relationship;Laundry;Volunteer;Shop;Personal Finances;Meal Prep;Yard Work    [employer] Potential  Fair    Clinical Impairments Affecting Rehab Potential  (+) good support system, high prior level of function (-) currently undergoing radiation therapy for prostate cancer, memory deficits     PT Frequency  2x / week    PT Duration  8 weeks    PT Treatment/Interventions  ADLs/Self Care Home Management;Aquatic Therapy;Cryotherapy;Moist Heat;Traction;Ultrasound;Therapeutic activities;Functional mobility training;Stair training;Gait training;DME Instruction;Therapeutic exercise;Balance training;Neuromuscular re-education;Patient/family education;Manual techniques;Passive range of motion;Energy conservation;Vestibular;Taping    PT Next Visit Plan  single limb stability    PT Home Exercise Plan  see above    Consulted and Agree with Plan of Care  Patient       Patient will benefit from skilled therapeutic intervention in order to improve the following deficits and impairments:  Abnormal gait, Cardiopulmonary status limiting activity, Decreased activity tolerance, Decreased balance, Decreased knowledge of precautions, Decreased endurance, Decreased coordination, Decreased cognition, Decreased knowledge of use of DME, Decreased mobility, Difficulty walking, Decreased safety awareness, Decreased strength, Impaired flexibility, Impaired perceived functional ability, Impaired sensation, Postural dysfunction, Improper body mechanics, Pain  Visit Diagnosis: Unsteadiness on  feet  Muscle weakness (generalized)  Other abnormalities of gait and mobility     Problem List Patient Active Problem List   Diagnosis Date Noted  . Status post abdominal aortic aneurysm (AAA) repair 04/15/2019  . Candidiasis 02/22/2019  . Malignant neoplasm of descending colon ([place])   . Benign neoplasm of descending colon   . Benign neoplasm of transverse colon   . Do not intubate, cardiopulmonary resuscitation (CPR)-only code status 10/08/2018  . Prostate cancer ([place]) 02/25/2018  . Prostatic cyst 01/15/2018  . 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 ([place]) 09/15/2017  . Blood in stool   . Abnormal feces   . Stricture and stenosis of esophagus   . Mild cognitive impairment 08/18/2017  .  Senile purpura ([place]) 04/29/2017  . Anemia 04/29/2017  . Frequent falls 04/29/2017  . Acute respiratory failure ([place]) 12/17/2016  . Simple chronic bronchitis ([place]) 12/17/2016  . Benign localized hyperplasia of prostate with urinary obstruction 07/15/2016  . Elevated PSA 07/15/2016  . Incomplete emptying of bladder 07/15/2016  . Urge incontinence 07/15/2016  . Hypothyroidism 04/25/2016  . Macular degeneration 04/25/2016  . Erectile dysfunction due to arterial insufficiency   . Ischemic heart disease with chronotropic incompetence   . CN (constipation) 02/10/2015  . DD (diverticular disease) 02/10/2015  . Fatigue 02/10/2015  . Lichen planus 68/10/2120  . Restless leg 02/10/2015  . 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 B12 deficiency 02/10/2015  . COPD (chronic obstructive pulmonary disease) ([place]) 11/14/2014  . Post Inflammatory Lung Changes 11/14/2014  . PAF (paroxysmal atrial fibrillation) ([facility]) CHA2DS2-VASc = 4. AC = Eliquis   . Insomnia 12/09/2013  . OSA on CPAP   . Dyspnea on exertion - -essentially resolved with BB dose reduction & wgt loss 03/29/2013  . Overweight (BMI 25.0-29.9) -- Wgt back up 01/17/2013  . Atherosclerotic heart disease of native coronary artery without angina  pectoris - s/p CABG, occluded SVG-D1; patent LIMA-LAD, SVG-OM, SVG- RCA   . Essential hypertension   . Hyperlipidemia with target LDL less than 70   . Aorto-iliac disease ([place])   . BCC (basal cell carcinoma), eyelid 01/03/2013  . Allergic rhinitis 08/17/2009  . Adaptation reaction 05/28/2009  . Acid reflux 05/28/2009  . Arthritis, degenerative 05/28/2009  . 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 Carotid artery occlusion without infarction 06/01/2006  . PAD (peripheral artery disease) - bilateral common iliac stents 12/15/2005  . Aortoiliac occlusive disease ([place]) 11/30/2005  . Atherosclerotic heart disease of artery bypass graft 09/01/1997   Janna Arch, PT, DPT   11/30/2019, 12:42 PM  [place] MAIN [facility] SERVICES [address] [place], [place], [address] Phone: [phone]   Fax:  [phone]  Name: [name] MRN: [id number] Date of Birth: [birth year]

## 2019-12-06 ENCOUNTER — Ambulatory Visit: Payer: Medicare Other | Attending: Nurse Practitioner

## 2019-12-06 ENCOUNTER — Other Ambulatory Visit: Payer: Self-pay

## 2019-12-06 DIAGNOSIS — R2689 Other abnormalities of gait and mobility: Secondary | ICD-10-CM | POA: Diagnosis present

## 2019-12-06 DIAGNOSIS — M6281 Muscle weakness (generalized): Secondary | ICD-10-CM | POA: Diagnosis present

## 2019-12-06 DIAGNOSIS — R2681 Unsteadiness on feet: Secondary | ICD-10-CM | POA: Diagnosis not present

## 2019-12-06 NOTE — Therapy (Signed)
[place] MAIN [facility] SERVICES [address] [place], [place], [address] Phone: [phone]   Fax:  [phone]  Physical Therapy Treatment  Patient Details  Name: [name] MRN: [id number] Date of Birth: 02-26-1934 Referring Provider (PT): Eulogio Ditch   Encounter Date: 12/06/2019  PT End of Session - 12/06/19 1118    Visit Number  46    Number of Visits  60    Date for PT Re-Evaluation  01/23/20    Authorization Type  6/10 PN 3/15    PT Start Time  1114    PT Stop Time  1155    PT Time Calculation (min)  41 min    Equipment Utilized During Treatment  Gait belt    Activity Tolerance  Patient tolerated treatment well    Behavior During Therapy  [facility] for tasks assessed/performed       Past Medical History:  Diagnosis Date  . AAA (abdominal aortic aneurysm) ([place])   . Arthritis   . Atherosclerotic heart disease of native coronary artery without angina pectoris 1998   - s/p CABG, occluded SVG-D1; patent LIMA-LAD, SVG-OM, SVG- RCA  . Basal cell carcinoma of eyelid 01/03/2013   2019 - R side of Nose  . Benign neoplasm of ascending colon   . Benign neoplasm of descending colon   . Cancer [facility])    Skin cancer  . Colon polyps   . COPD (chronic obstructive pulmonary disease) ([place])   . Coronary artery disease   . Dementia ([place])   . Dysrhythmia    atrial fib  . Falls frequently   . GERD (gastroesophageal reflux disease)   . Hemorrhoid 02/10/2015  . History of hiatal hernia   . History of kidney stones   . HOH (hard of hearing)   . Hypertension   . Memory loss   . Osteoporosis   . PAF (paroxysmal atrial fibrillation) ([place])   . Prostate cancer ([place])   . Prostate disorder   . Sciatic pain    Chronic  . Sleep apnea    cpap  . Temporary cerebral vascular dysfunction 02/10/2015   Had negative work-up.  July, 2012.  No medication changes, had forgot them that day, got dehydrated.   . Urinary incontinence     Past Surgical  History:  Procedure Laterality Date  . ABDOMINAL AORTIC ENDOVASCULAR STENT GRAFT Bilateral 05/18/2019   Procedure: ABDOMINAL AORTIC ENDOVASCULAR STENT GRAFT;  Surgeon: Algernon Huxley, MD;  Location: [facility] ORS;  Service: Vascular;  Laterality: Bilateral;  . ANKLE SURGERY    . Arch aortogram and carotid aortogram  06/02/2006   Dr Oneida Alar did surgery  . BASAL CELL CARCINOMA EXCISION  04/2016   Dermatology  . CARDIAC CATHETERIZATION  01/30/1998    (Dr. Linard Millers): Native LAD & mRCA CTO. 90% D1. Mod Cx. SVG-D1 CTO. SVG-RCA & SVG-OM along with LIMA-LAD patent;  LV NORMAL Fxn  . CAROTID ENDARTERECTOMY Left Oct. 29, 2007   Dr. Oneida Alar:  . CATARACT EXTRACTION W/PHACO Right 03/05/2017   Procedure: CATARACT EXTRACTION PHACO AND INTRAOCULAR LENS PLACEMENT (IOC);  Surgeon: Leandrew Koyanagi, MD;  Location: [facility] ORS;  Service: Ophthalmology;  Laterality: Right;  [place] 00:58.5 AP% 10.6 CDE 6.20 Fluid Pack lot # [id number]  . CATARACT EXTRACTION W/PHACO Left 04/15/2017   Procedure: CATARACT EXTRACTION PHACO AND INTRAOCULAR LENS PLACEMENT ([place])  Left;  Surgeon: Leandrew Koyanagi, MD;  Location: [place];  Service: Ophthalmology;  Laterality: Left;  . COLONOSCOPY WITH PROPOFOL N/A 09/15/2017  Procedure: COLONOSCOPY WITH PROPOFOL;  Surgeon: Lucilla Lame, MD;  Location: [facility] ENDOSCOPY;  Service: Endoscopy;  Laterality: N/A;  . COLONOSCOPY WITH PROPOFOL N/A 10/26/2018   Procedure: COLONOSCOPY WITH PROPOFOL;  Surgeon: Lucilla Lame, MD;  Location: [facility] ENDOSCOPY;  Service: Endoscopy;  Laterality: N/A;  . CORONARY ARTERY BYPASS GRAFT  1998   LIMA-LAD, SVG-OM, SVG-RPDA, SVG-DIAG  . CPET / MET  12/2012   Mild chronotropic incompetence - read 82% of predicted; also reduced effort; peak VO2 15.7 / 75% (did not reach Max effort) -- suggested ischemic response in last 1.5 minutes of exercise. Normal pulmonary function on PFTs but poor response to  . DOPPLER ECHOCARDIOGRAPHY  11/20/2010   EF =>55%; LV norm mild aortic  scelorosis  . ENDOVASCULAR REPAIR/STENT GRAFT N/A 05/18/2019   Procedure: ENDOVASCULAR REPAIR/STENT GRAFT;  Surgeon: Algernon Huxley, MD;  Location: [place] CV LAB;  Service: Cardiovascular;  Laterality: N/A;  . ESOPHAGOGASTRODUODENOSCOPY (EGD) WITH PROPOFOL N/A 09/15/2017   Procedure: ESOPHAGOGASTRODUODENOSCOPY (EGD) WITH PROPOFOL;  Surgeon: Lucilla Lame, MD;  Location: [facility] ENDOSCOPY;  Service: Endoscopy;  Laterality: N/A;  . FRACTURE SURGERY Right 03/19/2015   wrist  . HIATAL HERNIA REPAIR    . ILIAC ARTERY STENT  12/15/2005   PTA and direct stenting rgt and lft common iliac arteries  . KYPHOPLASTY N/A 04/05/2019   Procedure: KYPHOPLASTY T12, L3, L4 L5;  Surgeon: Hessie Knows, MD;  Location: [facility] ORS;  Service: Orthopedics;  Laterality: N/A;  . NM GATED MYOVIEW ([place] [facility])  12/2017   Low Risk - no ischemia or infarct.  EF > 65%. no RWMA  . NM MYOVIEW ([place] HX)  11/11/2010; February 2016   a) TM STRESS----NORMAL PERFUSION ,EF 68%; b) EF 50-55%. Normal LV function. No significant ischemia or infarction.  . OPEN REDUCTION INTERNAL FIXATION (ORIF) DISTAL RADIAL FRACTURE Left 01/13/2019   Procedure: OPEN REDUCTION INTERNAL FIXATION (ORIF) DISTAL  RADIAL FRACTURE - LEFT - SLEEP APNEA;  Surgeon: Hessie Knows, MD;  Location: [facility] ORS;  Service: Orthopedics;  Laterality: Left;  . ORIF ELBOW FRACTURE Right 01/01/2017   Procedure: OPEN REDUCTION INTERNAL FIXATION (ORIF) ELBOW/OLECRANON FRACTURE;  Surgeon: Hessie Knows, MD;  Location: [facility] ORS;  Service: Orthopedics;  Laterality: Right;  . ORIF WRIST FRACTURE Right 03/19/2015   Procedure: OPEN REDUCTION INTERNAL FIXATION (ORIF) WRIST FRACTURE;  Surgeon: Hessie Knows, MD;  Location: [facility] ORS;  Service: Orthopedics;  Laterality: Right;  . THORACIC AORTA - CAROTID ANGIOGRAM  October 2007   Dr. 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 TRANSTHORACIC ECHOCARDIOGRAM  February  2016   [facility]: Normal LV function. Dilated left atrium.    There were no vitals filed for this visit.  Subjective Assessment - 12/06/19 1117    Subjective  Patient reports he has been able to do short walks with the good weather, has been able to do about a quarter of a mile per patient report.    Pertinent History  Patient referred to physical therapy for balance/strength issues s/p abdominal aortic aneurysm (AAA) without rupture repair on 05/18/19 .  Patient has been seen by this therapist in the past (last seen in march of 2020) Patient had a fall on 01/08/19, MRI from [facility] dated 03/28/19 report acute compression fractures at T12, L3 and L4 as well as facet arthropathy at the lower lumbar levels and at L3-4 moderate central stenosis. Patient underwent injection on 10/6 at L4-5 and L5-S1. PMH includes AAA, arthritis, atherosclerotic  heart disease, (s/p CABG), benign neoplasm of ascending/descending colon, COPD, CAD, dementia, dysrhythmia, frequent falls, GERD, hemorrhoid, HOH, HTN, PAF, prostate cancer. Monday through Friday have 3 people come in different times from 730 to 12pm. Uses a walker occasionally now    Limitations  Walking;Standing;House hold activities;Other (comment);Lifting    How long can you sit comfortably?  20 minutes before back is painful    How long can you stand comfortably?  hour    How long can you walk comfortably?  using a walker about 30 minutes    Patient Stated Goals  patient would like to improve his balance and strenght.    Currently in Pain?  Yes    Pain Score  3     Pain Location  Back    Pain Orientation  Lower    Pain Descriptors / Indicators  Aching    Pain Type  Chronic pain    Pain Onset  More than a month ago    Pain Frequency  Intermittent            Treatment  With support surface CGA with cueing for sequencing and body mechanics    One foot airex pad one foot 6" step, static semi tandem stance 30 second holds each position; no episodes of  instability this session  airex pad: SUE support 6" step toe taps 12x each LE      Ambulate negotiation changing surfaces from concrete sideways, grass, and hospital sliding doors with multiple negotiations of obstacles/people. >1500 ft, two seated rest breaks . SUE support required for unstable surface of grass.    seated : cueing for body mechanics and sequencing for optimal muscle recruitment and strengthening.     RTB Around ankles alternating LE kick outs 15x each LE    RTB around ankles ER/IR 15x each LE,   Seated adduction ball squeeze 15x 3 second holds    Seated ball toss for trunk pertubation's, righting reactions, and timing x 3 minutes    Seated rest breaks to monitor oxygen and HR. Breathing technique reminders required.    Pt educated throughout session about proper posture and technique with exercises. Improved exercise technique, movement at target joints, use of target muscles after min to mod verbal, visual, tactile cues.                     PT Education - 12/06/19 1118    Education provided  Yes    Education Details  exercise technique, body mechanics    Person(s) Educated  Patient    Methods  Explanation;Demonstration;Tactile cues;Verbal cues    Comprehension  Verbalized understanding;Returned demonstration;Verbal cues required;Tactile cues required       PT Short Term Goals - 10/03/19 1312      PT SHORT TERM GOAL #1   Title  Patient will be independent in home exercise program to improve strength/mobility for better functional independence with ADLs.    Baseline  10/16: HEP given 11/17: HEP compliant    Time  4    Period  Weeks    Status  Achieved    Target Date  07/15/19      PT SHORT TERM GOAL #2   Title  Patient will deny any falls over past 4 weeks to demonstrate improved safety awareness at home and work.    Baseline  10/16: one major fall 11/17: no falls    Time  4    Period  Weeks    Status  Achieved  Target Date   07/15/19        PT Long Term Goals - 11/28/19 1642      PT LONG TERM GOAL #1   Title  Patient will increase BLE gross strength to 4+/5 as to improve functional strength for independent gait, increased standing tolerance and increased ADL ability.    Baseline  10/16: R gross 4-/5 with add/ext 3+/5 L grossly 3+/5 with hip add/ext 2+/5 11/17: R 4/5 add/ext; -/5 L 4-/5 hip add/ext 3/5 12/8: R: 4/5 L 4-/5 hip add/ext 3+/5 2/1: R 4+/5 L 4/5 with hip ext 4-/5    Time  8    Period  Weeks    Status  Partially Met    Target Date  01/23/20      PT LONG TERM GOAL #2   Title  Patient (> 7 years old) will complete five times sit to stand test in < 15 seconds indicating an increased LE strength and improved balance.    Baseline  10/16: 17 seconds with heavy use of hands on knees 11/17: 16 seconds one near LOB 12/8: 13 seconds one posterior LOB    Time  8    Period  Weeks    Status  Achieved      PT LONG TERM GOAL #3   Title  Patient will increase Berg Balance score by > 6 points (43/56)  to demonstrate decreased fall risk during functional activities.    Baseline  10/16: 37/56 11/17: 42/56 12/8: 47/56    Time  8    Period  Weeks    Status  Achieved      PT LONG TERM GOAL #4   Title  Patient will increase 10 meter walk test to >1.66ms as to improve gait speed for better community ambulation and to reduce fall risk.    Baseline  10/16: 0.91 w/o AD 11/17: 1.43 m/s with no AD    Time  8    Period  Weeks    Status  Achieved      PT LONG TERM GOAL #5   Title  Patient will increase ABC scale score >80% to demonstrate better functional mobility and better confidence with ADLs.    Baseline  10/16; 71% 11/17: 80% 12/8: 83%    Time  8    Period  Weeks    Status  Achieved      Additional Long Term Goals   Additional Long Term Goals  Yes      PT LONG TERM GOAL #6   Title  Patient will improve score on DGI to > or = 19/24 on the DGI to increase dynamic balance during mobility and to decrease risk  of falls.    Baseline  12/8: perform next session 12/29: 18/24 2/1: 18/24 3/15: 20/24    Time  8    Period  Weeks    Status  Achieved      PT LONG TERM GOAL #7   Title  Patient will increase Berg Balance score by > 6 points (53/56)  to demonstrate decreased fall risk during functional activities.    Baseline  12/8; 47/56 12/29: 49/56 2/1: 46/56 3/15: 47/56    Time  8    Period  Weeks    Status  Partially Met    Target Date  01/23/20      PT LONG TERM GOAL #8   Title  Patient will increase six minute walk test distance to >1000 for progression to community ambulator and improve  gait ability    Baseline  3/29: 756 ft    Time  8    Period  Weeks    Status  New    Target Date  01/23/20            Plan - 12/06/19 1305    Clinical Impression Statement  Patient is progressing with functional capacity for mobility with decreased need for rest breaks during ambulation. Patient is able to safely stabilize self without LOB this session. Unstable surfaces continue to require SUE support due to LOB at this time. Patient would benefit from further skilled PT intervention to increase strength and stability for safer ADL performance and increased functional ability.    Personal Factors and Comorbidities  Age;Comorbidity 3+;Fitness;Past/Current Experience;Sex;Social Background;Time since onset of injury/illness/exacerbation;Transportation    Comorbidities  AAA, arthritis, atherosclerotic heart disease, (s/p CABG), benign neoplasm of ascending/descending colon, COPD, CAD, dementia, dysrhythmia, frequent falls, GERD, hemorrhoid, HOH, HTN, PAF, prostate cancer.    Examination-Activity Limitations  Bathing;Bend;Carry;Dressing;Continence;Stairs;Squat;Reach Overhead;Locomotion Level;Lift;Stand;Transfers;Toileting    Examination-Participation Restrictions  Church;Cleaning;Community Activity;Driving;Interpersonal Relationship;Laundry;Volunteer;Shop;Personal Finances;Meal Prep;Yard Work    [employer] Potential   Fair    Clinical Impairments Affecting Rehab Potential  (+) good support system, high prior level of function (-) currently undergoing radiation therapy for prostate cancer, memory deficits     PT Frequency  2x / week    PT Duration  8 weeks    PT Treatment/Interventions  ADLs/Self Care Home Management;Aquatic Therapy;Cryotherapy;Moist Heat;Traction;Ultrasound;Therapeutic activities;Functional mobility training;Stair training;Gait training;DME Instruction;Therapeutic exercise;Balance training;Neuromuscular re-education;Patient/family education;Manual techniques;Passive range of motion;Energy conservation;Vestibular;Taping    PT Next Visit Plan  single limb stability    PT Home Exercise Plan  see above    Consulted and Agree with Plan of Care  Patient       Patient will benefit from skilled therapeutic intervention in order to improve the following deficits and impairments:  Abnormal gait, Cardiopulmonary status limiting activity, Decreased activity tolerance, Decreased balance, Decreased knowledge of precautions, Decreased endurance, Decreased coordination, Decreased cognition, Decreased knowledge of use of DME, Decreased mobility, Difficulty walking, Decreased safety awareness, Decreased strength, Impaired flexibility, Impaired perceived functional ability, Impaired sensation, Postural dysfunction, Improper body mechanics, Pain  Visit Diagnosis: Unsteadiness on feet  Muscle weakness (generalized)  Other abnormalities of gait and mobility     Problem List Patient Active Problem List   Diagnosis Date Noted  . Status post abdominal aortic aneurysm (AAA) repair 04/15/2019  . Candidiasis 02/22/2019  . Malignant neoplasm of descending colon ([place])   . Benign neoplasm of descending colon   . Benign neoplasm of transverse colon   . Do not intubate, cardiopulmonary resuscitation (CPR)-only code status 10/08/2018  . Prostate cancer ([place]) 02/25/2018  . Prostatic cyst 01/15/2018  .  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 Blood in stool   . Abnormal feces   . Stricture and stenosis of esophagus   . Mild cognitive impairment 08/18/2017  . Senile purpura ([place]) 04/29/2017  . Anemia 04/29/2017  . Frequent falls 04/29/2017  . Acute respiratory failure ([place]) 12/17/2016  . Simple chronic bronchitis ([place]) 12/17/2016  . Benign localized hyperplasia of prostate with urinary obstruction 07/15/2016  . Elevated PSA 07/15/2016  . Incomplete emptying of bladder 07/15/2016  . Urge incontinence 07/15/2016  . Hypothyroidism 04/25/2016  . Macular degeneration 04/25/2016  . Erectile dysfunction due to arterial insufficiency   . Ischemic heart disease with chronotropic incompetence   . CN (constipation) 02/10/2015  . DD (diverticular disease) 02/10/2015  . Fatigue 02/10/2015  .  Lichen planus 48/32/3468  . Restless leg 02/10/2015  . 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 B12 deficiency 02/10/2015  . COPD (chronic obstructive pulmonary disease) ([place]) 11/14/2014  . Post Inflammatory Lung Changes 11/14/2014  . PAF (paroxysmal atrial fibrillation) ([facility]) CHA2DS2-VASc = 4. AC = Eliquis   . Insomnia 12/09/2013  . OSA on CPAP   . Dyspnea on exertion - -essentially resolved with BB dose reduction & wgt loss 03/29/2013  . Overweight (BMI 25.0-29.9) -- Wgt back up 01/17/2013  . Atherosclerotic heart disease of native coronary artery without angina pectoris - s/p CABG, occluded SVG-D1; patent LIMA-LAD, SVG-OM, SVG- RCA   . Essential hypertension   . Hyperlipidemia with target LDL less than 70   . Aorto-iliac disease ([place])   . BCC (basal cell carcinoma), eyelid 01/03/2013  . Allergic rhinitis 08/17/2009  . Adaptation reaction 05/28/2009  . Acid reflux 05/28/2009  . Arthritis, degenerative 05/28/2009  . 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 Carotid artery occlusion without infarction 06/01/2006  . PAD (peripheral artery disease) - bilateral common iliac stents  12/15/2005  . Aortoiliac occlusive disease ([place]) 11/30/2005  . Atherosclerotic heart disease of artery bypass graft 09/01/1997   Janna Arch, PT, DPT   12/06/2019, 1:06 PM  [place] MAIN [facility] SERVICES [address] [place], [place], [address] Phone: [phone]   Fax:  [phone]  Name: [name] MRN: [id number] Date of Birth: 1934/05/26

## 2019-12-08 ENCOUNTER — Ambulatory Visit: Payer: Medicare Other

## 2019-12-12 ENCOUNTER — Ambulatory Visit: Payer: Medicare Other

## 2019-12-13 ENCOUNTER — Other Ambulatory Visit: Payer: Self-pay

## 2019-12-13 ENCOUNTER — Ambulatory Visit: Payer: Medicare Other

## 2019-12-13 DIAGNOSIS — M6281 Muscle weakness (generalized): Secondary | ICD-10-CM

## 2019-12-13 DIAGNOSIS — R2689 Other abnormalities of gait and mobility: Secondary | ICD-10-CM

## 2019-12-13 DIAGNOSIS — R2681 Unsteadiness on feet: Secondary | ICD-10-CM

## 2019-12-13 NOTE — Therapy (Signed)
[place] MAIN [facility] SERVICES [address] [place], [place], [address] Phone: [phone]   Fax:  [phone]  Physical Therapy Treatment  Patient Details  Name: William JAGGERS MRN: [id number] Date of Birth: 23-Aug-1934 Referring Provider (PT): Eulogio Ditch   Encounter Date: 12/13/2019  PT End of Session - 12/13/19 1119    Visit Number  47    Number of Visits  60    Date for PT Re-Evaluation  01/23/20    Authorization Type  7/10 PN 3/15    PT Start Time  1114    PT Stop Time  1159    PT Time Calculation (min)  45 min    Equipment Utilized During Treatment  Gait belt    Activity Tolerance  Patient tolerated treatment well    Behavior During Therapy  [facility] for tasks assessed/performed       Past Medical History:  Diagnosis Date  . AAA (abdominal aortic aneurysm) ([place])   . Arthritis   . Atherosclerotic heart disease of native coronary artery without angina pectoris 1998   - s/p CABG, occluded SVG-D1; patent LIMA-LAD, SVG-OM, SVG- RCA  . Basal cell carcinoma of eyelid 01/03/2013   2019 - R side of Nose  . Benign neoplasm of ascending colon   . Benign neoplasm of descending colon   . Cancer [facility])    Skin cancer  . Colon polyps   . COPD (chronic obstructive pulmonary disease) ([place])   . Coronary artery disease   . Dementia ([place])   . Dysrhythmia    atrial fib  . Falls frequently   . GERD (gastroesophageal reflux disease)   . Hemorrhoid 02/10/2015  . History of hiatal hernia   . History of kidney stones   . HOH (hard of hearing)   . Hypertension   . Memory loss   . Osteoporosis   . PAF (paroxysmal atrial fibrillation) ([place])   . Prostate cancer ([place])   . Prostate disorder   . Sciatic pain    Chronic  . Sleep apnea    cpap  . Temporary cerebral vascular dysfunction 02/10/2015   Had negative work-up.  July, 2012.  No medication changes, had forgot them that day, got dehydrated.   . Urinary incontinence     Past Surgical  History:  Procedure Laterality Date  . ABDOMINAL AORTIC ENDOVASCULAR STENT GRAFT Bilateral 05/18/2019   Procedure: ABDOMINAL AORTIC ENDOVASCULAR STENT GRAFT;  Surgeon: Algernon Huxley, MD;  Location: [facility] ORS;  Service: Vascular;  Laterality: Bilateral;  . ANKLE SURGERY    . Arch aortogram and carotid aortogram  06/02/2006   Dr Oneida Alar did surgery  . BASAL CELL CARCINOMA EXCISION  04/2016   Dermatology  . CARDIAC CATHETERIZATION  01/30/1998    (Dr. Linard Millers): Native LAD & mRCA CTO. 90% D1. Mod Cx. SVG-D1 CTO. SVG-RCA & SVG-OM along with LIMA-LAD patent;  LV NORMAL Fxn  . CAROTID ENDARTERECTOMY Left Oct. 29, 2007   Dr. Oneida Alar:  . CATARACT EXTRACTION W/PHACO Right 03/05/2017   Procedure: CATARACT EXTRACTION PHACO AND INTRAOCULAR LENS PLACEMENT (IOC);  Surgeon: Leandrew Koyanagi, MD;  Location: [facility] ORS;  Service: Ophthalmology;  Laterality: Right;  [place] 00:58.5 AP% 10.6 CDE 6.20 Fluid Pack lot # [id number]  . CATARACT EXTRACTION W/PHACO Left 04/15/2017   Procedure: CATARACT EXTRACTION PHACO AND INTRAOCULAR LENS PLACEMENT ([place])  Left;  Surgeon: Leandrew Koyanagi, MD;  Location: [place];  Service: Ophthalmology;  Laterality: Left;  . COLONOSCOPY WITH PROPOFOL N/A 09/15/2017  Procedure: COLONOSCOPY WITH PROPOFOL;  Surgeon: Lucilla Lame, MD;  Location: [facility] ENDOSCOPY;  Service: Endoscopy;  Laterality: N/A;  . COLONOSCOPY WITH PROPOFOL N/A 10/26/2018   Procedure: COLONOSCOPY WITH PROPOFOL;  Surgeon: Lucilla Lame, MD;  Location: [facility] ENDOSCOPY;  Service: Endoscopy;  Laterality: N/A;  . CORONARY ARTERY BYPASS GRAFT  1998   LIMA-LAD, SVG-OM, SVG-RPDA, SVG-DIAG  . CPET / MET  12/2012   Mild chronotropic incompetence - read 82% of predicted; also reduced effort; peak VO2 15.7 / 75% (did not reach Max effort) -- suggested ischemic response in last 1.5 minutes of exercise. Normal pulmonary function on PFTs but poor response to  . DOPPLER ECHOCARDIOGRAPHY  11/20/2010   EF =>55%; LV norm mild aortic  scelorosis  . ENDOVASCULAR REPAIR/STENT GRAFT N/A 05/18/2019   Procedure: ENDOVASCULAR REPAIR/STENT GRAFT;  Surgeon: Algernon Huxley, MD;  Location: [place] CV LAB;  Service: Cardiovascular;  Laterality: N/A;  . ESOPHAGOGASTRODUODENOSCOPY (EGD) WITH PROPOFOL N/A 09/15/2017   Procedure: ESOPHAGOGASTRODUODENOSCOPY (EGD) WITH PROPOFOL;  Surgeon: Lucilla Lame, MD;  Location: [facility] ENDOSCOPY;  Service: Endoscopy;  Laterality: N/A;  . FRACTURE SURGERY Right 03/19/2015   wrist  . HIATAL HERNIA REPAIR    . ILIAC ARTERY STENT  12/15/2005   PTA and direct stenting rgt and lft common iliac arteries  . KYPHOPLASTY N/A 04/05/2019   Procedure: KYPHOPLASTY T12, L3, L4 L5;  Surgeon: Hessie Knows, MD;  Location: [facility] ORS;  Service: Orthopedics;  Laterality: N/A;  . NM GATED MYOVIEW ([place] [facility])  12/2017   Low Risk - no ischemia or infarct.  EF > 65%. no RWMA  . NM MYOVIEW ([place] [facility])  11/11/2010; February 2016   a) TM STRESS----NORMAL PERFUSION ,EF 68%; b) EF 50-55%. Normal LV function. No significant ischemia or infarction.  . OPEN REDUCTION INTERNAL FIXATION (ORIF) DISTAL RADIAL FRACTURE Left 01/13/2019   Procedure: OPEN REDUCTION INTERNAL FIXATION (ORIF) DISTAL  RADIAL FRACTURE - LEFT - SLEEP APNEA;  Surgeon: Hessie Knows, MD;  Location: [facility] ORS;  Service: Orthopedics;  Laterality: Left;  . ORIF ELBOW FRACTURE Right 01/01/2017   Procedure: OPEN REDUCTION INTERNAL FIXATION (ORIF) ELBOW/OLECRANON FRACTURE;  Surgeon: Hessie Knows, MD;  Location: [facility] ORS;  Service: Orthopedics;  Laterality: Right;  . ORIF WRIST FRACTURE Right 03/19/2015   Procedure: OPEN REDUCTION INTERNAL FIXATION (ORIF) WRIST FRACTURE;  Surgeon: Hessie Knows, MD;  Location: [facility] ORS;  Service: Orthopedics;  Laterality: Right;  . THORACIC AORTA - CAROTID ANGIOGRAM  October 2007   Dr. 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 TRANSTHORACIC ECHOCARDIOGRAM  February  2016   [facility]: Normal LV function. Dilated left atrium.    There were no vitals filed for this visit.  Subjective Assessment - 12/13/19 1117    Subjective  Patient reports his injection on Wednesday went well, he overdid it on the weekend but is feeling ok today. Has walked every day. No stumbles or falls since last session.    Pertinent History  Patient referred to physical therapy for balance/strength issues s/p abdominal aortic aneurysm (AAA) without rupture repair on 05/18/19 .  Patient has been seen by this therapist in the past (last seen in march of 2020) Patient had a fall on 01/08/19, MRI from [facility] dated 03/28/19 report acute compression fractures at T12, L3 and L4 as well as facet arthropathy at the lower lumbar levels and at L3-4 moderate central stenosis. Patient underwent injection on 10/6 at L4-5 and L5-S1. PMH includes AAA,  arthritis, atherosclerotic heart disease, (s/p CABG), benign neoplasm of ascending/descending colon, COPD, CAD, dementia, dysrhythmia, frequent falls, GERD, hemorrhoid, HOH, HTN, PAF, prostate cancer. Monday through Friday have 3 people come in different times from 730 to 12pm. Uses a walker occasionally now    Limitations  Walking;Standing;House hold activities;Other (comment);Lifting    How long can you sit comfortably?  20 minutes before back is painful    How long can you stand comfortably?  hour    How long can you walk comfortably?  using a walker about 30 minutes    Patient Stated Goals  patient would like to improve his balance and strenght.    Currently in Pain?  Yes    Pain Score  2     Pain Location  Back    Pain Orientation  Lower    Pain Descriptors / Indicators  Aching    Pain Type  Chronic pain    Pain Onset  More than a month ago    Pain Frequency  Intermittent              Treatment  Nustep Lvl 3 RPM> 60 for cardiovascular challenge 3 minutes  With support surface CGA with cueing for sequencing and body mechanics    One foot airex pad  one foot 6" step, static semi tandem stance 30 second holds each position; no episodes of instability this session   airex pad: SUE support 6" step toe taps 12x each LE    Standing with # 4 ankle weight: CGA for stability  -Hip extension with Bilateral upper extremity support, cueing for neutral hip alignment, upright posture for optimal muscle recruitment, and sequencing, 10x each LE,  -Hip abduction with Bilateral upper extremity support, cueing for neutral foot alignment for correct muscle activation, 10x each LE -Hip flexion with Bilateral upper extremity support, cueing for body mechanics, speed of muscle recruitment for optimal strengthening and stabilization 10x each LE -Hamstring curl with Bilateral upper extremity support, cueing for knee alignment for recruitment of hamstring musculature, 10x each LE  Step over and back orange hurdle 10x each LE, SUE support  Lateral step over and back orange hurdle 10x each LE,   Standing heel raise 15x.   Ambulate in hallway with vertical ball raise 2x 100 ft with close CGA  Ambulate in hallway with lateral ball twists 2x 100 ft with close CGA.  .    seated : cueing for body mechanics and sequencing for optimal muscle recruitment and strengthening.     Seated with #4 ankle weights  -Seated marches with upright posture, back away from back of chair for abdominal/trunk activation/stabilization, 10x each LE -Seated LAQ with 3 second holds, 10x each LE, cueing for muscle activation and sequencing for neutral alignment -Seated IR/ER with cueing for stabilizing knee placement with lateral foot movement for optimal muscle recruitment, 10x each LE   Seated ball toss for trunk pertubation's, righting reactions, and timing x 3 minutes     Seated rest breaks to monitor oxygen and HR. Breathing technique reminders required.    Pt educated throughout session about proper posture and technique with exercises. Improved exercise technique, movement at  target joints, use of target muscles after min to mod verbal, visual, tactile cues.                   PT Education - 12/13/19 1118    Education provided  Yes    Education Details  exercise technique, body mechanics  Person(s) Educated  Patient    Methods  Explanation;Demonstration;Tactile cues;Verbal cues    Comprehension  Verbalized understanding;Returned demonstration;Verbal cues required;Tactile cues required       PT Short Term Goals - 10/03/19 1312      PT SHORT TERM GOAL #1   Title  Patient will be independent in home exercise program to improve strength/mobility for better functional independence with ADLs.    Baseline  10/16: HEP given 11/17: HEP compliant    Time  4    Period  Weeks    Status  Achieved    Target Date  07/15/19      PT SHORT TERM GOAL #2   Title  Patient will deny any falls over past 4 weeks to demonstrate improved safety awareness at home and work.    Baseline  10/16: one major fall 11/17: no falls    Time  4    Period  Weeks    Status  Achieved    Target Date  07/15/19        PT Long Term Goals - 11/28/19 1642      PT LONG TERM GOAL #1   Title  Patient will increase BLE gross strength to 4+/5 as to improve functional strength for independent gait, increased standing tolerance and increased ADL ability.    Baseline  10/16: R gross 4-/5 with add/ext 3+/5 L grossly 3+/5 with hip add/ext 2+/5 11/17: R 4/5 add/ext; -/5 L 4-/5 hip add/ext 3/5 12/8: R: 4/5 L 4-/5 hip add/ext 3+/5 2/1: R 4+/5 L 4/5 with hip ext 4-/5    Time  8    Period  Weeks    Status  Partially Met    Target Date  01/23/20      PT LONG TERM GOAL #2   Title  Patient (> 82 years old) will complete five times sit to stand test in < 15 seconds indicating an increased LE strength and improved balance.    Baseline  10/16: 17 seconds with heavy use of hands on knees 11/17: 16 seconds one near LOB 12/8: 13 seconds one posterior LOB    Time  8    Period  Weeks    Status   Achieved      PT LONG TERM GOAL #3   Title  Patient will increase Berg Balance score by > 6 points (43/56)  to demonstrate decreased fall risk during functional activities.    Baseline  10/16: 37/56 11/17: 42/56 12/8: 47/56    Time  8    Period  Weeks    Status  Achieved      PT LONG TERM GOAL #4   Title  Patient will increase 10 meter walk test to >1.80ms as to improve gait speed for better community ambulation and to reduce fall risk.    Baseline  10/16: 0.91 w/o AD 11/17: 1.43 m/s with no AD    Time  8    Period  Weeks    Status  Achieved      PT LONG TERM GOAL #5   Title  Patient will increase ABC scale score >80% to demonstrate better functional mobility and better confidence with ADLs.    Baseline  10/16; 71% 11/17: 80% 12/8: 83%    Time  8    Period  Weeks    Status  Achieved      Additional Long Term Goals   Additional Long Term Goals  Yes      PT LONG TERM GOAL #  6   Title  Patient will improve score on DGI to > or = 19/24 on the DGI to increase dynamic balance during mobility and to decrease risk of falls.    Baseline  12/8: perform next session 12/29: 18/24 2/1: 18/24 3/15: 20/24    Time  8    Period  Weeks    Status  Achieved      PT LONG TERM GOAL #7   Title  Patient will increase Berg Balance score by > 6 points (53/56)  to demonstrate decreased fall risk during functional activities.    Baseline  12/8; 47/56 12/29: 49/56 2/1: 46/56 3/15: 47/56    Time  8    Period  Weeks    Status  Partially Met    Target Date  01/23/20      PT LONG TERM GOAL #8   Title  Patient will increase six minute walk test distance to >1000 for progression to community ambulator and improve gait ability    Baseline  3/29: 756 ft    Time  8    Period  Weeks    Status  New    Target Date  01/23/20            Plan - 12/13/19 1201    Clinical Impression Statement  Patient continues to present with improved coordination and capacity for dual tasks. He demonstrated decreased  episodes of instability with ambulation with a secondary task. He does require seated rest breaks between weighted interventions. Hand eye coordination does challenge some tasks at this time. Patient would benefit from further skilled PT intervention to increase strength and stability for safer ADL performance and increased functional ability.    Personal Factors and Comorbidities  Age;Comorbidity 3+;Fitness;Past/Current Experience;Sex;Social Background;Time since onset of injury/illness/exacerbation;Transportation    Comorbidities  AAA, arthritis, atherosclerotic heart disease, (s/p CABG), benign neoplasm of ascending/descending colon, COPD, CAD, dementia, dysrhythmia, frequent falls, GERD, hemorrhoid, HOH, HTN, PAF, prostate cancer.    Examination-Activity Limitations  Bathing;Bend;Carry;Dressing;Continence;Stairs;Squat;Reach Overhead;Locomotion Level;Lift;Stand;Transfers;Toileting    Examination-Participation Restrictions  Church;Cleaning;Community Activity;Driving;Interpersonal Relationship;Laundry;Volunteer;Shop;Personal Finances;Meal Prep;Yard Work    [employer] Potential  Fair    Clinical Impairments Affecting Rehab Potential  (+) good support system, high prior level of function (-) currently undergoing radiation therapy for prostate cancer, memory deficits     PT Frequency  2x / week    PT Duration  8 weeks    PT Treatment/Interventions  ADLs/Self Care Home Management;Aquatic Therapy;Cryotherapy;Moist Heat;Traction;Ultrasound;Therapeutic activities;Functional mobility training;Stair training;Gait training;DME Instruction;Therapeutic exercise;Balance training;Neuromuscular re-education;Patient/family education;Manual techniques;Passive range of motion;Energy conservation;Vestibular;Taping    PT Next Visit Plan  single limb stability    PT Home Exercise Plan  see above    Consulted and Agree with Plan of Care  Patient       Patient will benefit from skilled therapeutic intervention in order to  improve the following deficits and impairments:  Abnormal gait, Cardiopulmonary status limiting activity, Decreased activity tolerance, Decreased balance, Decreased knowledge of precautions, Decreased endurance, Decreased coordination, Decreased cognition, Decreased knowledge of use of DME, Decreased mobility, Difficulty walking, Decreased safety awareness, Decreased strength, Impaired flexibility, Impaired perceived functional ability, Impaired sensation, Postural dysfunction, Improper body mechanics, Pain  Visit Diagnosis: Unsteadiness on feet  Muscle weakness (generalized)  Other abnormalities of gait and mobility     Problem List Patient Active Problem List   Diagnosis Date Noted  . Status post abdominal aortic aneurysm (AAA) repair 04/15/2019  . Candidiasis 02/22/2019  . Malignant neoplasm of descending colon ([place])   .  Benign neoplasm of descending colon   . Benign neoplasm of transverse colon   . Do not intubate, cardiopulmonary resuscitation (CPR)-only code status 10/08/2018  . Prostate cancer ([place]) 02/25/2018  . Prostatic cyst 01/15/2018  . 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 ([place]) 09/15/2017  . Blood in stool   . Abnormal feces   . Stricture and stenosis of esophagus   . Mild cognitive impairment 08/18/2017  . Senile purpura ([place]) 04/29/2017  . Anemia 04/29/2017  . Frequent falls 04/29/2017  . Acute respiratory failure ([place]) 12/17/2016  . Simple chronic bronchitis ([place]) 12/17/2016  . Benign localized hyperplasia of prostate with urinary obstruction 07/15/2016  . Elevated PSA 07/15/2016  . Incomplete emptying of bladder 07/15/2016  . Urge incontinence 07/15/2016  . Hypothyroidism 04/25/2016  . Macular degeneration 04/25/2016  . Erectile dysfunction due to arterial insufficiency   . Ischemic heart disease with chronotropic incompetence   . CN (constipation) 02/10/2015  . DD (diverticular disease) 02/10/2015  . Fatigue 02/10/2015  . Lichen planus 01/56/1537  . Restless  leg 02/10/2015  . 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 B12 deficiency 02/10/2015  . COPD (chronic obstructive pulmonary disease) ([place]) 11/14/2014  . Post Inflammatory Lung Changes 11/14/2014  . PAF (paroxysmal atrial fibrillation) ([facility]) CHA2DS2-VASc = 4. AC = Eliquis   . Insomnia 12/09/2013  . OSA on CPAP   . Dyspnea on exertion - -essentially resolved with BB dose reduction & wgt loss 03/29/2013  . Overweight (BMI 25.0-29.9) -- Wgt back up 01/17/2013  . Atherosclerotic heart disease of native coronary artery without angina pectoris - s/p CABG, occluded SVG-D1; patent LIMA-LAD, SVG-OM, SVG- RCA   . Essential hypertension   . Hyperlipidemia with target LDL less than 70   . Aorto-iliac disease ([place])   . BCC (basal cell carcinoma), eyelid 01/03/2013  . Allergic rhinitis 08/17/2009  . Adaptation reaction 05/28/2009  . Acid reflux 05/28/2009  . Arthritis, degenerative 05/28/2009  . 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 Carotid artery occlusion without infarction 06/01/2006  . PAD (peripheral artery disease) - bilateral common iliac stents 12/15/2005  . Aortoiliac occlusive disease ([place]) 11/30/2005  . Atherosclerotic heart disease of artery bypass graft 09/01/1997   Janna Arch, PT, DPT   12/13/2019, 12:03 PM  [place] MAIN [facility] SERVICES [address] [place], [place], [address] Phone: [phone]   Fax:  [phone]  Name: ADDIS TUOHY MRN: [id number] Date of Birth: Oct 01, 1933

## 2019-12-15 ENCOUNTER — Ambulatory Visit: Payer: Medicare Other | Admitting: Urology

## 2019-12-15 ENCOUNTER — Other Ambulatory Visit: Payer: Self-pay

## 2019-12-15 ENCOUNTER — Ambulatory Visit: Payer: Medicare Other

## 2019-12-15 DIAGNOSIS — R2681 Unsteadiness on feet: Secondary | ICD-10-CM

## 2019-12-15 DIAGNOSIS — M6281 Muscle weakness (generalized): Secondary | ICD-10-CM

## 2019-12-15 DIAGNOSIS — R2689 Other abnormalities of gait and mobility: Secondary | ICD-10-CM

## 2019-12-15 NOTE — Therapy (Signed)
[place] MAIN [facility] SERVICES [address] [place], [place], [address] Phone: [phone]   Fax:  [phone]  Physical Therapy Treatment  Patient Details  Name: [name] MRN: [id number] Date of Birth: 07-Apr-1934 Referring Provider (PT): Eulogio Ditch   Encounter Date: 12/15/2019  PT End of Session - 12/15/19 1240    Visit Number  48    Number of Visits  60    Date for PT Re-Evaluation  01/23/20    Authorization Type  8/10 PN 3/15    PT Start Time  1109    PT Stop Time  1156    PT Time Calculation (min)  47 min    Equipment Utilized During Treatment  Gait belt    Activity Tolerance  Patient tolerated treatment well    Behavior During Therapy  [facility] for tasks assessed/performed       Past Medical History:  Diagnosis Date  . AAA (abdominal aortic aneurysm) ([place])   . Arthritis   . Atherosclerotic heart disease of native coronary artery without angina pectoris 1998   - s/p CABG, occluded SVG-D1; patent LIMA-LAD, SVG-OM, SVG- RCA  . Basal cell carcinoma of eyelid 01/03/2013   2019 - R side of Nose  . Benign neoplasm of ascending colon   . Benign neoplasm of descending colon   . Cancer [facility])    Skin cancer  . Colon polyps   . COPD (chronic obstructive pulmonary disease) ([place])   . Coronary artery disease   . Dementia ([place])   . Dysrhythmia    atrial fib  . Falls frequently   . GERD (gastroesophageal reflux disease)   . Hemorrhoid 02/10/2015  . History of hiatal hernia   . History of kidney stones   . HOH (hard of hearing)   . Hypertension   . Memory loss   . Osteoporosis   . PAF (paroxysmal atrial fibrillation) ([place])   . Prostate cancer ([place])   . Prostate disorder   . Sciatic pain    Chronic  . Sleep apnea    cpap  . Temporary cerebral vascular dysfunction 02/10/2015   Had negative work-up.  July, 2012.  No medication changes, had forgot them that day, got dehydrated.   . Urinary incontinence     Past Surgical  History:  Procedure Laterality Date  . ABDOMINAL AORTIC ENDOVASCULAR STENT GRAFT Bilateral 05/18/2019   Procedure: ABDOMINAL AORTIC ENDOVASCULAR STENT GRAFT;  Surgeon: Algernon Huxley, MD;  Location: [facility] ORS;  Service: Vascular;  Laterality: Bilateral;  . ANKLE SURGERY    . Arch aortogram and carotid aortogram  06/02/2006   Dr Oneida Alar did surgery  . BASAL CELL CARCINOMA EXCISION  04/2016   Dermatology  . CARDIAC CATHETERIZATION  01/30/1998    (Dr. Linard Millers): Native LAD & mRCA CTO. 90% D1. Mod Cx. SVG-D1 CTO. SVG-RCA & SVG-OM along with LIMA-LAD patent;  LV NORMAL Fxn  . CAROTID ENDARTERECTOMY Left Oct. 29, 2007   Dr. Oneida Alar:  . CATARACT EXTRACTION W/PHACO Right 03/05/2017   Procedure: CATARACT EXTRACTION PHACO AND INTRAOCULAR LENS PLACEMENT (IOC);  Surgeon: Leandrew Koyanagi, MD;  Location: [facility] ORS;  Service: Ophthalmology;  Laterality: Right;  [place] 00:58.5 AP% 10.6 CDE 6.20 Fluid Pack lot # [id number]  . CATARACT EXTRACTION W/PHACO Left 04/15/2017   Procedure: CATARACT EXTRACTION PHACO AND INTRAOCULAR LENS PLACEMENT ([place])  Left;  Surgeon: Leandrew Koyanagi, MD;  Location: [place];  Service: Ophthalmology;  Laterality: Left;  . COLONOSCOPY WITH PROPOFOL N/A 09/15/2017  Procedure: COLONOSCOPY WITH PROPOFOL;  Surgeon: Lucilla Lame, MD;  Location: [facility] ENDOSCOPY;  Service: Endoscopy;  Laterality: N/A;  . COLONOSCOPY WITH PROPOFOL N/A 10/26/2018   Procedure: COLONOSCOPY WITH PROPOFOL;  Surgeon: Lucilla Lame, MD;  Location: [facility] ENDOSCOPY;  Service: Endoscopy;  Laterality: N/A;  . CORONARY ARTERY BYPASS GRAFT  1998   LIMA-LAD, SVG-OM, SVG-RPDA, SVG-DIAG  . CPET / MET  12/2012   Mild chronotropic incompetence - read 82% of predicted; also reduced effort; peak VO2 15.7 / 75% (did not reach Max effort) -- suggested ischemic response in last 1.5 minutes of exercise. Normal pulmonary function on PFTs but poor response to  . DOPPLER ECHOCARDIOGRAPHY  11/20/2010   EF =>55%; LV norm mild aortic  scelorosis  . ENDOVASCULAR REPAIR/STENT GRAFT N/A 05/18/2019   Procedure: ENDOVASCULAR REPAIR/STENT GRAFT;  Surgeon: Algernon Huxley, MD;  Location: [place] CV LAB;  Service: Cardiovascular;  Laterality: N/A;  . ESOPHAGOGASTRODUODENOSCOPY (EGD) WITH PROPOFOL N/A 09/15/2017   Procedure: ESOPHAGOGASTRODUODENOSCOPY (EGD) WITH PROPOFOL;  Surgeon: Lucilla Lame, MD;  Location: [facility] ENDOSCOPY;  Service: Endoscopy;  Laterality: N/A;  . FRACTURE SURGERY Right 03/19/2015   wrist  . HIATAL HERNIA REPAIR    . ILIAC ARTERY STENT  12/15/2005   PTA and direct stenting rgt and lft common iliac arteries  . KYPHOPLASTY N/A 04/05/2019   Procedure: KYPHOPLASTY T12, L3, L4 L5;  Surgeon: Hessie Knows, MD;  Location: [facility] ORS;  Service: Orthopedics;  Laterality: N/A;  . NM GATED MYOVIEW ([place] [facility])  12/2017   Low Risk - no ischemia or infarct.  EF > 65%. no RWMA  . NM MYOVIEW ([place] [facility])  11/11/2010; February 2016   a) TM STRESS----NORMAL PERFUSION ,EF 68%; b) EF 50-55%. Normal LV function. No significant ischemia or infarction.  . OPEN REDUCTION INTERNAL FIXATION (ORIF) DISTAL RADIAL FRACTURE Left 01/13/2019   Procedure: OPEN REDUCTION INTERNAL FIXATION (ORIF) DISTAL  RADIAL FRACTURE - LEFT - SLEEP APNEA;  Surgeon: Hessie Knows, MD;  Location: [facility] ORS;  Service: Orthopedics;  Laterality: Left;  . ORIF ELBOW FRACTURE Right 01/01/2017   Procedure: OPEN REDUCTION INTERNAL FIXATION (ORIF) ELBOW/OLECRANON FRACTURE;  Surgeon: Hessie Knows, MD;  Location: [facility] ORS;  Service: Orthopedics;  Laterality: Right;  . ORIF WRIST FRACTURE Right 03/19/2015   Procedure: OPEN REDUCTION INTERNAL FIXATION (ORIF) WRIST FRACTURE;  Surgeon: Hessie Knows, MD;  Location: [facility] ORS;  Service: Orthopedics;  Laterality: Right;  . THORACIC AORTA - CAROTID ANGIOGRAM  October 2007   Dr. 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 TRANSTHORACIC ECHOCARDIOGRAM  February  2016   [facility]: Normal LV function. Dilated left atrium.    There were no vitals filed for this visit.  Subjective Assessment - 12/15/19 1122    Subjective  Patient presents with significant other. No falls or LOB since last session. Has been active walking.    Pertinent History  Patient referred to physical therapy for balance/strength issues s/p abdominal aortic aneurysm (AAA) without rupture repair on 05/18/19 .  Patient has been seen by this therapist in the past (last seen in march of 2020) Patient had a fall on 01/08/19, MRI from [facility] dated 03/28/19 report acute compression fractures at T12, L3 and L4 as well as facet arthropathy at the lower lumbar levels and at L3-4 moderate central stenosis. Patient underwent injection on 10/6 at L4-5 and L5-S1. PMH includes AAA, arthritis, atherosclerotic heart disease, (s/p CABG), benign neoplasm of ascending/descending colon, COPD, CAD, dementia,  dysrhythmia, frequent falls, GERD, hemorrhoid, HOH, HTN, PAF, prostate cancer. Monday through Friday have 3 people come in different times from 730 to 12pm. Uses a walker occasionally now    Limitations  Walking;Standing;House hold activities;Other (comment);Lifting    How long can you sit comfortably?  20 minutes before back is painful    How long can you stand comfortably?  hour    How long can you walk comfortably?  using a walker about 30 minutes    Patient Stated Goals  patient would like to improve his balance and strenght.    Currently in Pain?  Yes    Pain Score  2     Pain Location  Back    Pain Orientation  Lower    Pain Descriptors / Indicators  Aching    Pain Type  Chronic pain    Pain Onset  More than a month ago    Pain Frequency  Intermittent            Treatment   With support surface CGA with cueing for sequencing and body mechanics    One foot airex pad one foot 6" step, static semi tandem stance 30 second holds each position; no episodes of instability this session   airex pad: SUE  support 6" step toe taps 12x each LE  airex pad: pertubation's by throwing small balls at object for dual task and challenge to stability x 20.   RTB around ankles: lateral stepping 4x length of // bars   RTB around ankles monster walks 4x length of // bars.     Standing with # 4 ankle weight: CGA for stability   -backwards ambulation 4x length of // bars , close CGA and cueing for bigger steps  -Hip flexion with Bilateral upper extremity support, cueing for body mechanics, speed of muscle recruitment for optimal strengthening and stabilization 4x length of // bars    Speed ladder, one foot each square to promote longer step length for more neutral gait pattern, 6x length of // bars, challenging without UE support, requires occasional SUE    Speed ladder: two steps each box, cues for clearing ladder segments x 6 lengths of // bars    Ambulate in hallway with vertical ball raise 2x 100 ft with close CGA   Ambulate in hallway with lateral ball twists 2x 100 ft with close CGA.  .    seated : cueing for body mechanics and sequencing for optimal muscle recruitment and strengthening.     GTB abduction 20x GTB marching 20x each LE RTB df 20x each LE  Seated balloon taps for trunk perturbation's, righting reactions, and timing x 3 minutes     Seated rest breaks to monitor oxygen and HR. Breathing technique reminders required.    Pt educated throughout session about proper posture and technique with exercises. Improved exercise technique, movement at target joints, use of target muscles after min to mod verbal, visual, tactile cues.                     PT Education - 12/15/19 1122    Education provided  Yes    Education Details  exercise technique, body mechanics    Person(s) Educated  Patient    Methods  Explanation;Demonstration;Tactile cues;Verbal cues    Comprehension  Returned demonstration;Verbalized understanding;Verbal cues required;Tactile cues required        PT Short Term Goals - 10/03/19 1312      PT SHORT TERM GOAL #1  Title  Patient will be independent in home exercise program to improve strength/mobility for better functional independence with ADLs.    Baseline  10/16: HEP given 11/17: HEP compliant    Time  4    Period  Weeks    Status  Achieved    Target Date  07/15/19      PT SHORT TERM GOAL #2   Title  Patient will deny any falls over past 4 weeks to demonstrate improved safety awareness at home and work.    Baseline  10/16: one major fall 11/17: no falls    Time  4    Period  Weeks    Status  Achieved    Target Date  07/15/19        PT Long Term Goals - 11/28/19 1642      PT LONG TERM GOAL #1   Title  Patient will increase BLE gross strength to 4+/5 as to improve functional strength for independent gait, increased standing tolerance and increased ADL ability.    Baseline  10/16: R gross 4-/5 with add/ext 3+/5 L grossly 3+/5 with hip add/ext 2+/5 11/17: R 4/5 add/ext; -/5 L 4-/5 hip add/ext 3/5 12/8: R: 4/5 L 4-/5 hip add/ext 3+/5 2/1: R 4+/5 L 4/5 with hip ext 4-/5    Time  8    Period  Weeks    Status  Partially Met    Target Date  01/23/20      PT LONG TERM GOAL #2   Title  Patient (> 23 years old) will complete five times sit to stand test in < 15 seconds indicating an increased LE strength and improved balance.    Baseline  10/16: 17 seconds with heavy use of hands on knees 11/17: 16 seconds one near LOB 12/8: 13 seconds one posterior LOB    Time  8    Period  Weeks    Status  Achieved      PT LONG TERM GOAL #3   Title  Patient will increase Berg Balance score by > 6 points (43/56)  to demonstrate decreased fall risk during functional activities.    Baseline  10/16: 37/56 11/17: 42/56 12/8: 47/56    Time  8    Period  Weeks    Status  Achieved      PT LONG TERM GOAL #4   Title  Patient will increase 10 meter walk test to >1.36ms as to improve gait speed for better community ambulation and to reduce fall  risk.    Baseline  10/16: 0.91 w/o AD 11/17: 1.43 m/s with no AD    Time  8    Period  Weeks    Status  Achieved      PT LONG TERM GOAL #5   Title  Patient will increase ABC scale score >80% to demonstrate better functional mobility and better confidence with ADLs.    Baseline  10/16; 71% 11/17: 80% 12/8: 83%    Time  8    Period  Weeks    Status  Achieved      Additional Long Term Goals   Additional Long Term Goals  Yes      PT LONG TERM GOAL #6   Title  Patient will improve score on DGI to > or = 19/24 on the DGI to increase dynamic balance during mobility and to decrease risk of falls.    Baseline  12/8: perform next session 12/29: 18/24 2/1: 18/24 3/15: 20/24  Time  8    Period  Weeks    Status  Achieved      PT LONG TERM GOAL #7   Title  Patient will increase Berg Balance score by > 6 points (53/56)  to demonstrate decreased fall risk during functional activities.    Baseline  12/8; 47/56 12/29: 49/56 2/1: 46/56 3/15: 47/56    Time  8    Period  Weeks    Status  Partially Met    Target Date  01/23/20      PT LONG TERM GOAL #8   Title  Patient will increase six minute walk test distance to >1000 for progression to community ambulator and improve gait ability    Baseline  3/29: 756 ft    Time  8    Period  Weeks    Status  New    Target Date  01/23/20            Plan - 12/15/19 1241    Clinical Impression Statement  Patient continues to progress with dynamic stability on unstable surface. Ankle righting reactions are challenged by dual task such as throwing a ball when on an unstable surface. Patient would benefit from further skilled PT intervention to increase strength and stability for safer ADL performance and increased functional ability.    Personal Factors and Comorbidities  Age;Comorbidity 3+;Fitness;Past/Current Experience;Sex;Social Background;Time since onset of injury/illness/exacerbation;Transportation    Comorbidities  AAA, arthritis,  atherosclerotic heart disease, (s/p CABG), benign neoplasm of ascending/descending colon, COPD, CAD, dementia, dysrhythmia, frequent falls, GERD, hemorrhoid, HOH, HTN, PAF, prostate cancer.    Examination-Activity Limitations  Bathing;Bend;Carry;Dressing;Continence;Stairs;Squat;Reach Overhead;Locomotion Level;Lift;Stand;Transfers;Toileting    Examination-Participation Restrictions  Church;Cleaning;Community Activity;Driving;Interpersonal Relationship;Laundry;Volunteer;Shop;Personal Finances;Meal Prep;Yard Work    [employer] Potential  Fair    Clinical Impairments Affecting Rehab Potential  (+) good support system, high prior level of function (-) currently undergoing radiation therapy for prostate cancer, memory deficits     PT Frequency  2x / week    PT Duration  8 weeks    PT Treatment/Interventions  ADLs/Self Care Home Management;Aquatic Therapy;Cryotherapy;Moist Heat;Traction;Ultrasound;Therapeutic activities;Functional mobility training;Stair training;Gait training;DME Instruction;Therapeutic exercise;Balance training;Neuromuscular re-education;Patient/family education;Manual techniques;Passive range of motion;Energy conservation;Vestibular;Taping    PT Next Visit Plan  single limb stability    PT Home Exercise Plan  see above    Consulted and Agree with Plan of Care  Patient       Patient will benefit from skilled therapeutic intervention in order to improve the following deficits and impairments:  Abnormal gait, Cardiopulmonary status limiting activity, Decreased activity tolerance, Decreased balance, Decreased knowledge of precautions, Decreased endurance, Decreased coordination, Decreased cognition, Decreased knowledge of use of DME, Decreased mobility, Difficulty walking, Decreased safety awareness, Decreased strength, Impaired flexibility, Impaired perceived functional ability, Impaired sensation, Postural dysfunction, Improper body mechanics, Pain  Visit Diagnosis: Unsteadiness on  feet  Muscle weakness (generalized)  Other abnormalities of gait and mobility     Problem List Patient Active Problem List   Diagnosis Date Noted  . Status post abdominal aortic aneurysm (AAA) repair 04/15/2019  . Candidiasis 02/22/2019  . Malignant neoplasm of descending colon ([place])   . Benign neoplasm of descending colon   . Benign neoplasm of transverse colon   . Do not intubate, cardiopulmonary resuscitation (CPR)-only code status 10/08/2018  . Prostate cancer ([place]) 02/25/2018  . Prostatic cyst 01/15/2018  . 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 ([place]) 09/15/2017  . Blood in stool   . Abnormal feces   . Stricture and stenosis of esophagus   .  Mild cognitive impairment 08/18/2017  . Senile purpura ([place]) 04/29/2017  . Anemia 04/29/2017  . Frequent falls 04/29/2017  . Acute respiratory failure ([place]) 12/17/2016  . Simple chronic bronchitis ([place]) 12/17/2016  . Benign localized hyperplasia of prostate with urinary obstruction 07/15/2016  . Elevated PSA 07/15/2016  . Incomplete emptying of bladder 07/15/2016  . Urge incontinence 07/15/2016  . Hypothyroidism 04/25/2016  . Macular degeneration 04/25/2016  . Erectile dysfunction due to arterial insufficiency   . Ischemic heart disease with chronotropic incompetence   . CN (constipation) 02/10/2015  . DD (diverticular disease) 02/10/2015  . Fatigue 02/10/2015  . Lichen planus 74/04/1447  . Restless leg 02/10/2015  . 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 B12 deficiency 02/10/2015  . COPD (chronic obstructive pulmonary disease) ([place]) 11/14/2014  . Post Inflammatory Lung Changes 11/14/2014  . PAF (paroxysmal atrial fibrillation) ([facility]) CHA2DS2-VASc = 4. AC = Eliquis   . Insomnia 12/09/2013  . OSA on CPAP   . Dyspnea on exertion - -essentially resolved with BB dose reduction & wgt loss 03/29/2013  . Overweight (BMI 25.0-29.9) -- Wgt back up 01/17/2013  . Atherosclerotic heart disease of native coronary artery without angina  pectoris - s/p CABG, occluded SVG-D1; patent LIMA-LAD, SVG-OM, SVG- RCA   . Essential hypertension   . Hyperlipidemia with target LDL less than 70   . Aorto-iliac disease ([place])   . BCC (basal cell carcinoma), eyelid 01/03/2013  . Allergic rhinitis 08/17/2009  . Adaptation reaction 05/28/2009  . Acid reflux 05/28/2009  . Arthritis, degenerative 05/28/2009  . 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 Carotid artery occlusion without infarction 06/01/2006  . PAD (peripheral artery disease) - bilateral common iliac stents 12/15/2005  . Aortoiliac occlusive disease ([place]) 11/30/2005  . Atherosclerotic heart disease of artery bypass graft 09/01/1997   Janna Arch, PT, DPT   12/15/2019, 12:46 PM  [place] MAIN [facility] SERVICES [address] [place], [place], [address] Phone: [phone]   Fax:  [phone]  Name: [name] MRN: [id number] Date of Birth: 04-18-1934

## 2019-12-19 ENCOUNTER — Ambulatory Visit: Payer: Self-pay | Admitting: Urology

## 2019-12-20 ENCOUNTER — Encounter: Payer: Self-pay | Admitting: Cardiology

## 2019-12-20 ENCOUNTER — Ambulatory Visit: Payer: Medicare Other

## 2019-12-20 ENCOUNTER — Ambulatory Visit (INDEPENDENT_AMBULATORY_CARE_PROVIDER_SITE_OTHER): Payer: Medicare Other | Admitting: Cardiology

## 2019-12-20 ENCOUNTER — Other Ambulatory Visit: Payer: Self-pay

## 2019-12-20 VITALS — BP 130/74 | HR 68 | Temp 96.8°F | Ht 70.0 in | Wt 197.0 lb

## 2019-12-20 DIAGNOSIS — I480 Paroxysmal atrial fibrillation: Secondary | ICD-10-CM

## 2019-12-20 DIAGNOSIS — R2681 Unsteadiness on feet: Secondary | ICD-10-CM

## 2019-12-20 DIAGNOSIS — I2510 Atherosclerotic heart disease of native coronary artery without angina pectoris: Secondary | ICD-10-CM | POA: Diagnosis not present

## 2019-12-20 DIAGNOSIS — I10 Essential (primary) hypertension: Secondary | ICD-10-CM | POA: Diagnosis not present

## 2019-12-20 DIAGNOSIS — I7409 Other arterial embolism and thrombosis of abdominal aorta: Secondary | ICD-10-CM

## 2019-12-20 DIAGNOSIS — E785 Hyperlipidemia, unspecified: Secondary | ICD-10-CM | POA: Diagnosis not present

## 2019-12-20 DIAGNOSIS — R2689 Other abnormalities of gait and mobility: Secondary | ICD-10-CM

## 2019-12-20 DIAGNOSIS — I779 Disorder of arteries and arterioles, unspecified: Secondary | ICD-10-CM

## 2019-12-20 DIAGNOSIS — Z9889 Other specified postprocedural states: Secondary | ICD-10-CM

## 2019-12-20 DIAGNOSIS — M6281 Muscle weakness (generalized): Secondary | ICD-10-CM

## 2019-12-20 DIAGNOSIS — Z8679 Personal history of other diseases of the circulatory system: Secondary | ICD-10-CM

## 2019-12-20 NOTE — Patient Instructions (Signed)
Medication Instructions:  The current medical regimen is effective;  continue present plan and medications as directed. Please refer to the Current Medication list given to you today. *If you need a refill on your cardiac medications before your next appointment, please call your pharmacy*  Lab Work: FASTING LIPID, CMET, CBC AT [facility] If you have labs (blood work) drawn today and your tests are completely normal, you will receive your results only by:  [place] (if you have MyChart) OR A paper copy in the mail.  If you have any lab test that is abnormal or we need to change your treatment, we will call you to review the results. You may go to any [facility] that is convenient for you however, we do have a lab in our office that is able to assist you. You DO NOT need an appointment for our lab. The lab is open 8:00am and closes at 4:00pm. Lunch 12:45 - 1:45pm.  Follow-Up: Your next appointment:  6 month(s) Please call our office 2 months in advance to schedule this appointment Virtual Visit  with [name], MD  At [facility], you and your health needs are our priority.  As part of our continuing mission to provide you with exceptional heart care, we have created designated Provider Care Teams.  These Care Teams include your primary Cardiologist (physician) and Advanced Practice Providers (APPs -  Physician Assistants and Nurse Practitioners) who all work together to provide you with the care you need, when you need it.

## 2019-12-20 NOTE — Progress Notes (Signed)
Primary Care Provider: [name], MD Cardiologist: [name], MD Electrophysiologist: None  Clinic Note: Chief Complaint  Patient presents with  . Follow-up    Annual follow-up with me, but has had a complicated last year.  . Coronary Artery Disease    No angina, had a Myoview done by [facility]  . Atrial Fibrillation    no issues  . AAA    had EVAR last fall (rapidly progressed AAA)    HPI:    [name] is a 86 y.o. male with a PMH below who presents today for annual f/u - with quite a year since last visit.  PMH notable for CAD s/p CABGx4 in 1999, ischemic heart disease, PAF (new diagnosis) with chronotropic incompetence on Eliquis, CAROTID ARTERY OCCLUSION s/p L CEA, HLD, HTN, OSA on CPAP along with CENTRILOBULAR EMPHYSEMA, AORTOILIAC OCCLUSIVE DISEASE s/p COMMON ILIAC ARTERY STENTING 2007--> now s/p EVAR in September 2020 [facility]).    More recently he has been having more issues with dementia confusion as well as balance issues since his back procedures and EVAR.  On Aricept x ~ 1 yr.  [name] was last seen here in clinic in February 2020 prior to the Covid lockdown.  He was doing well from a cardiac standpoint.  He was having a lot of ankle and foot pain and back pain that kept him from walking.  Noted that he was doing less cardio exercise.  Staying active, but limited somewhat.  No chest pain or pressure.  Less prominent palpitations.  Noted intermittent fatigue. -> Had recent diagnosis of prostate cancer plan was for XRT and radioactive seed placement.  Was somewhat down because of this diagnosis. -> He was scheduled for abdominal ultrasound in the fall.  Recent Hospitalizations:   Jan 06, 2019: Suffered a fall with closed fracture of the left distal radius  ORIF distal LEFT RADIAL fracture  Acute right-sided back pain -> evaluated with MRI that showed slightly increased infrarenal abdominal aortic aneurysm 4.5 x 3.6 cm as opposed to  4.4 x 3.5 cm; showed wedge compression of thoracic vertebrae --> 04/05/2019: Kyphoplasty T12, L3, L4 and L5  Clinic Visit 04/10/2019 (in response to ER 8/3)-> LLQ Abd pain; felt to be possible constipation; also treated for UTI because of mild hematuria.  04/12/2019: Noted 30 pound weight loss over last month..-->  CT scan now showed infrarenal abdominal saccular aortic aneurysm measuring 5.0 x 3.9 x 4.9 cm increased from prior exam.;  Also noted diverticulosis and left renal calculi.  Consulted Dr. Lucky Cowboy (Vasc Sgx, [facility]) - Seen  8/14; he had noted that there is also some restenosis in the iliac stents but also in the negative's below the stents. ->  Was not able to palpate right PT and had trace left DP pulse.  Recommended endovascular repair with concha mitten treatment of iliac occlusive disease.  Despite having a cardiologist associated with [place], the patient was referred to Dr. Clayborn Bigness from [facility] for preop clearance.  Seen by Dr. Clayborn Bigness x 50fr pre-op evaluation: Felt to have "anginal equivalent symptoms " -> referred for echocardiogram and Myoview read as normal/low risk.  Was cleared for surgery -> instructions were to hold Eliquis for 7 days preop.   05/18/2019 AAA repair (Dr. DLucky Cowboy& Dr. SDelana Meyer - EVAR - 23 mm prox, 12 cm distal & x 18 cm length Gore Excluder Endoprosthesis Main body (via RFA distal to lowest Renal A); 121mx 14  cm L Contralateral Limb for L Iliac - extended with 8 mm x 6cm LifeStar stent (post-dilated with 7 mm DEB).  Additional R Iliac PTA not required.    ER ER visit July 03, 2019 with confusion  Reviewed  CV studies:    The following studies were reviewed today: (if available, images/films reviewed: From Epic Chart or Care Everywhere) . August 2020-Echocardiogram echo [facility] April 20, 2019): EF 55-60 %.  Mild LVH.  Relatively normal valves. . August 2020-Myoview [facility]): Normal wall motion.  No ischemia or  infarction.    Interval History:   [name] at long last returns here for follow-up.  I spent the better part of an hour reviewing his hospitalization since the last time I saw him.  This began with a fall with wrist fracture followed by back pain with vertebral compression fracture leading to kyphoplasty.  Interestingly at that time, his abdominal MRI did not show any significant progression of his AAA.  However, shortly thereafter, he was found to have significant progression of the AAA and underwent EVAR. He really has not fully recovered from all of these hospitalizations and procedures.  He has really been bothered now with an unsteady gait ever since his back procedure.  He is doing PT just about 2 days a week with neurology for his balance and gait issues.  He is gradually building up his stamina, but is a long way from where he was when I saw him last year.   He has finished his treatments for his prostate cancer, but is now also deconditioned from everything else that he is has not been able to do much of anything besides his physical therapy.  Thankfully, he has not had any cardiac issues throughout this.  His cardiac valuation was done through the courtesy of Dr. Clayborn Bigness in [facility].  I guess because he was a new patient and has noticed some exertional dyspnea, he was evaluated and echo and Myoview were both normal. He now is not having really any sensation whatsoever irregular heartbeats.  He does not have any significant exertional dyspnea besides being deconditioned now.  No PND orthopnea or edema.  No bleeding issues on his Eliquis.  Cardiovascular  Review of Symptoms (Summary): positive for - He does have exertional dyspnea and fatigue but this is somewhat has been going on since his initial fall last May.  He really has had no ER that active on the COVID-19 restrictions have kept his outings at a minimum. negative for - chest pain, edema, irregular  heartbeat, loss of consciousness, orthopnea, palpitations, paroxysmal nocturnal dyspnea, rapid heart rate, shortness of breath or Near syncope, TIA/amaurosis fugax, claudication.  The patient does not have symptoms concerning for COVID-19 infection (fever, chills, cough, or new shortness of breath).  The patient is practicing social distancing & Masking.   He has had both of his COVID-19 vaccine injections.  REVIEWED OF SYSTEMS   ROS  Low energy, poor balance.  Deconditioning more so than before.  Foot and ankle pain.  Back and hip pain.  I have reviewed and (if needed) personally updated the patient's problem list, medications, allergies, past medical and surgical history, social and family history.   PAST MEDICAL HISTORY   Past Medical History:  Diagnosis Date  . AAA (abdominal aortic aneurysm) ([place]) 05/2019   Relativley Rapid progression from June-Aug 2020 (4.6 - 5.4 cm): s/p EVAR (Dr. Lucky Cowboy [facility]): EVAR - 23 mm prox, 12 cm distal &  x 18 cm length Gore Excluder Endoprosthesis Main body (via RFA distal to lowest Renal A); 24m x 14 cm L Contralateral Limb for L Iliac - extended with 8 mm x 6cm LifeStar stent (post-dilated with 7 mm DEB).  Additional R Iliac PTA not required.   . Arthritis   . Basal cell carcinoma of eyelid 01/03/2013   2019 - R side of Nose  . Benign neoplasm of ascending colon   . Benign neoplasm of descending colon   . Bilateral iliac artery stenosis ([place] 2007   Bilateral Iliac A stenting; extension of EVAR limbs into both Iliacs -- additional stent placed in L Iliac.  . Cancer ([place]    Skin cancer  . Colon polyps   . Compression fracture of fourth lumbar vertebra ([place] 04/2019   - s/p Kyphoplasty; T12, L3-5 ([facility]  . COPD (chronic obstructive pulmonary disease) (HCC)    Centrilobular Emphysema  . Coronary artery disease involving native coronary artery without angina pectoris 1998   - s/p CABG, occluded SVG-D1; patent LIMA-LAD, SVG-OM, SVG- RCA  . Dementia  ([place]    memory loss - started noting late 2019  . Falls frequently   . GERD (gastroesophageal reflux disease)   . Hemorrhoid 02/10/2015  . History of hiatal hernia   . History of kidney stones   . HOH (hard of hearing)   . Hypertension   . Osteoporosis   . PAF (paroxysmal atrial fibrillation) ([place]    on Eliquis for OAC - Rate Control   . Prostate cancer ([place]   . Prostate disorder   . Sciatic pain    Chronic  . Sleep apnea    cpap  . Temporary cerebral vascular dysfunction 02/10/2015   Had negative work-up.  July, 2012.  No medication changes, had forgot them that day, got dehydrated.   . Urinary incontinence     PAST SURGICAL HISTORY   Past Surgical History:  Procedure Laterality Date  . ABDOMINAL AORTIC ENDOVASCULAR STENT GRAFT Bilateral 05/18/2019   Procedure: ABDOMINAL AORTIC ENDOVASCULAR STENT GRAFT;  Surgeon: DAlgernon Huxley MD;  Location: [facility] ORS;  Service: Vascular;  Laterality: Bilateral;  . ANKLE SURGERY    . Arch aortogram and carotid aortogram  06/02/2006   Dr FOneida Alardid surgery  . BASAL CELL CARCINOMA EXCISION  04/2016   Dermatology  . CARDIAC CATHETERIZATION  01/30/1998    (Dr. HLinard Millers: Native LAD & mRCA CTO. 90% D1. Mod Cx. SVG-D1 CTO. SVG-RCA & SVG-OM along with LIMA-LAD patent;  LV NORMAL Fxn  . CAROTID ENDARTERECTOMY Left Oct. 29, 2007   Dr. FOneida Alar  . CATARACT EXTRACTION W/PHACO Right 03/05/2017   Procedure: CATARACT EXTRACTION PHACO AND INTRAOCULAR LENS PLACEMENT (IOC);  Surgeon: BLeandrew Koyanagi MD;  Location: [facility] ORS;  Service: Ophthalmology;  Laterality: Right;  UK[place]00:58.5 AP% 10.6 CDE 6.20 Fluid Pack lot # [id number]  . CATARACT EXTRACTION W/PHACO Left 04/15/2017   Procedure: CATARACT EXTRACTION PHACO AND INTRAOCULAR LENS PLACEMENT ([place]  Left;  Surgeon: BLeandrew Koyanagi MD;  [place]  Service: Ophthalmology;  Laterality: Left;  . COLONOSCOPY WITH PROPOFOL N/A 09/15/2017   Procedure: COLONOSCOPY WITH PROPOFOL;  Surgeon:  WLucilla Lame MD;  [facility];  Service: Endoscopy;  Laterality: N/A;  . COLONOSCOPY WITH PROPOFOL N/A 10/26/2018   Procedure: COLONOSCOPY WITH PROPOFOL;  Surgeon: WLucilla Lame MD;  [facility];  Service: Endoscopy;  Laterality: N/A;  . CORONARY ARTERY BYPASS GRAFT  1998   LIMA-LAD, SVG-OM, SVG-RPDA, SVG-DIAG  . CPET /  MET  12/2012   Mild chronotropic incompetence - read 82% of predicted; also reduced effort; peak VO2 15.7 / 75% (did not reach Max effort) -- suggested ischemic response in last 1.5 minutes of exercise. Normal pulmonary function on PFTs but poor response to  . ENDOVASCULAR REPAIR/STENT GRAFT N/A 05/18/2019   Procedure: ENDOVASCULAR REPAIR/STENT GRAFT;  Surgeon: Algernon Huxley, MD;  Location: [facility] INVASIVE CV LAB;; EVAR - 23 mm prox, 12 cm distal & x 18 cm length Gore Excluder Endoprosthesis Main body (via RFA distal to lowest Renal A); 42m x 14 cm L Contralateral Limb for L Iliac - extended with 8 mm x 6cm LifeStar stent; no additional R Iliac PTA needed.   . ESOPHAGOGASTRODUODENOSCOPY (EGD) WITH PROPOFOL N/A 09/15/2017   Procedure: ESOPHAGOGASTRODUODENOSCOPY (EGD) WITH PROPOFOL;  Surgeon: WLucilla Lame MD;  [facility];  Service: Endoscopy;  Laterality: N/A;  . FRACTURE SURGERY Right 03/19/2015   wrist  . HIATAL HERNIA REPAIR    . ILIAC ARTERY STENT  12/15/2005   PTA and direct stenting rgt and lft common iliac arteries  . KYPHOPLASTY N/A 04/05/2019   Procedure: KYPHOPLASTY T12, L3, L4 L5;  Surgeon: MHessie Knows MD;  Location: [facility] ORS;  Service: Orthopedics;  Laterality: N/A;  . NM GATED MYOVIEW ([place])  05/'19; 8/'20   a) [facility]: Low Risk - no ischemia or infarct.  EF > 65%. no RWMA;; b) ([facility]: Normal wall motion.  No ischemia or infarction.  . OPEN REDUCTION INTERNAL FIXATION (ORIF) DISTAL RADIAL FRACTURE Left 01/13/2019   Procedure: OPEN REDUCTION INTERNAL FIXATION (ORIF) DISTAL  RADIAL FRACTURE - LEFT - SLEEP APNEA;  Surgeon:  MHessie Knows MD;  Location: [facility] ORS;  Service: Orthopedics;  Laterality: Left;  . ORIF ELBOW FRACTURE Right 01/01/2017   Procedure: OPEN REDUCTION INTERNAL FIXATION (ORIF) ELBOW/OLECRANON FRACTURE;  Surgeon: MHessie Knows MD;  Location: [facility] ORS;  Service: Orthopedics;  Laterality: Right;  . ORIF WRIST FRACTURE Right 03/19/2015   Procedure: OPEN REDUCTION INTERNAL FIXATION (ORIF) WRIST FRACTURE;  Surgeon: MHessie Knows MD;  Location: [facility] ORS;  Service: Orthopedics;  Laterality: Right;  . THORACIC AORTA - CAROTID ANGIOGRAM  October 2007   Dr. 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 TRANSTHORACIC ECHOCARDIOGRAM  February 2016   [facility]: Normal LV function. Dilated left atrium.  .Domingo DimesECHOCARDIOGRAM  04/2019   [facility] EF 55-60 %.  Mild LVH.  Relatively normal valves.    MEDICATIONS/ALLERGIES   Current Meds  Medication Sig  . acetaminophen (TYLENOL) 500 MG tablet Take 500-1,000 mg by mouth every 6 (six) hours as needed for mild pain or moderate pain.   . Ascorbic Acid (VITAMIN C) 1000 MG tablet Take 1,000 mg by mouth daily.  . Cyanocobalamin (VITAMIN B-12) 500 MCG SUBL Place 500 mcg under the tongue daily.   .Marland Kitchendonepezil (ARICEPT) 10 MG tablet Take 10 mg by mouth at bedtime.   .Marland KitchenELIQUIS 5 MG TABS tablet TAKE 1 TABLET BY MOUTH TWICE DAILY (Patient taking differently: Take 5 mg by mouth 2 (two) times daily. )  . ezetimibe (ZETIA) 10 MG tablet Take 1 tablet (10 mg total) by mouth daily. (Patient taking differently: Take 10 mg by mouth at bedtime. )  . finasteride (PROSCAR) 5 MG tablet Take 1 tablet (5 mg total) by mouth daily.  . fluticasone (FLONASE) 50 MCG/ACT nasal spray PLACE TWO SPRAYS IN BOTH NOSRTILS EVERY DAY AS NEEDED  FOR ALLERGIES  . lisinopril (ZESTRIL) 2.5 MG tablet Take 1 tablet (2.5 mg total) by mouth 2 (two) times daily.  . Magnesium Oxide 500 MG TABS Take 500  mg by mouth every evening.  . metoprolol tartrate (LOPRESSOR) 25 MG tablet Take 1 tablet (25 mg total) by mouth 2 (two) times daily. *OFFICE VISIT NEEDED FOR FURTHER REFILLS*  . mirabegron ER (MYRBETRIQ) 50 MG TB24 tablet Take 1 tablet (50 mg total) by mouth daily. (Patient taking differently: Take 50 mg by mouth every evening. )  . Multiple Vitamins-Minerals (PRESERVISION AREDS 2 PO) Take 1 tablet by mouth 2 (two) times daily.   . nitroGLYCERIN (NITROSTAT) 0.4 MG SL tablet Place 1 tablet (0.4 mg total) under the tongue every 5 (five) minutes as needed for chest pain.  . NON FORMULARY Swish and spit 5 mLs at bedtime. Doxycycline/dexamethasone/nystatin/diphenhydramine compounded mouth elixir  . pantoprazole (PROTONIX) 40 MG tablet TAKE ONE TABLET BY MOUTH EVERY DAY  . rosuvastatin (CRESTOR) 20 MG tablet TAKE ONE TABLET BY MOUTH EVERY DAY (Patient taking differently: Take 20 mg by mouth every evening. )  . tamsulosin (FLOMAX) 0.4 MG CAPS capsule Take 1 capsule (0.4 mg total) by mouth every evening.  . umeclidinium-vilanterol (ANORO ELLIPTA) 62.5-25 MCG/INH AEPB Inhale 1 puff into the lungs daily.   . [DISCONTINUED] ADVAIR DISKUS 250-50 MCG/DOSE AEPB INHALE ONE PUFF BY MOUTH TWICE DAILY AS DIRECTED (RINSE MOUTH AFTER USE) (Patient not taking: No sig reported)  . [DISCONTINUED] alendronate (FOSAMAX) 70 MG tablet Take 1 tablet (70 mg total) by mouth once a week. Take with a full glass of water on an empty stomach. (Patient not taking: Reported on 12/25/2019)  . [DISCONTINUED] dexamethasone (DECADRON) 4 MG tablet   . [DISCONTINUED] Multiple Vitamins-Iron TABS Take by mouth.    Allergies  Allergen Reactions  . Clonazepam Other (See Comments)    Altered mental status  . Ropinirole Swelling    Other reaction(s): Hallucination  . Codeine Other (See Comments)    Altered mental status.  . Niacin And Related Other (See Comments)    Flushing of skin  . Tramadol     Other reaction(s): Hallucination  .  Hydrocodone     confusion  . Oxycodone Other (See Comments)    confusion confusion    SOCIAL HISTORY/FAMILY HISTORY   Reviewed in Epic:  Pertinent findings:  Social History   Social History Narrative   Resides at [facility] since 06/2018   He is a widowed father of one, grandfather of 30 with 3 stepchildren.    Now accompanied by his long-term significant other who lives just a few minutes away. .    She accompanies him with just not any clinic visit or hospitalization.  She also helps make sure he is taking his medication.   He has become more debilitated since his EVAR last September and has had more gait instability with balance issues.   He is not nearly as active as he used to be.  Really got deconditioned and has taken a long time to recover.   Currently undergoing physical therapy, Occupational Therapy treatments.      He has changed his CODE STATUS to DNR.    OBJCTIVE -PE, EKG, labs   Wt Readings from Last 3 Encounters:  12/25/19 196 lb 3.4 oz (89 kg)  12/24/19 196 lb 3.4 oz (89 kg)  12/20/19 197 lb (89.4 kg)    Physical Exam: BP 130/74   Pulse 68   Temp (!) 96.8 F (36 C)  Ht '5\' 10"'$  (1.778 m)   Wt 197 lb (89.4 kg)   SpO2 97%   BMI 28.27 kg/m  Physical Exam  Constitutional: He is oriented to person, place, and time. He appears well-developed and well-nourished. No distress.  Notably later I am somewhat older appearing gentleman than last visit.  He is well-groomed, but has a much more stoic and fatigued appearance.  HENT:  Head: Normocephalic and atraumatic.  Neck: No JVD present.  Cardiovascular: Normal rate and S1 normal. An irregularly irregular rhythm present. PMI is not displaced. Exam reveals distant heart sounds and decreased pulses (Mildly decreased but palpable pulses roughly 1+ pedal pulses.). Exam reveals no gallop and no friction rub.  No murmur heard. Physiologically split S2  Pulmonary/Chest: Effort normal. No respiratory distress. He has  wheezes (Mild expiratory wheezes with rapid expiration). He has no rales.  Abdominal: Soft. Bowel sounds are normal. He exhibits no distension. There is no abdominal tenderness.  Musculoskeletal:        General: No edema (Trivial).     Cervical back: Normal range of motion and neck supple.     Comments: He has a relatively slow almost shuffling unsteady gait walking with a cane.  Neurological: He is alert and oriented to person, place, and time.  Psychiatric: His behavior is normal. Judgment and thought content normal.  Mildly down mood with normal affect.  Defers more question answering to his significant other.  Vitals reviewed.    Adult ECG Report Not checked  Recent Labs:   Due for annual labs -- ordered today.   February 2020: TC 110, TG 133, HDL 35, LDL 44.   Lab Results  Component Value Date   TSH 2.610 06/14/2019    ASSESSMENT/PLAN    Problem List Items Addressed This Visit    Atherosclerotic heart disease of native coronary artery without angina pectoris - s/p CABG, occluded SVG-D1; patent LIMA-LAD, SVG-OM, SVG- RCA - Primary (Chronic)    Overall stable no cardiac standpoint.  No active anginal symptoms.  He has now had several Myoview test (both in 2019 and 2020) that have been nonischemic with normal EF.  With the level of exertion that he is able to achieve given his deconditioning, he is not having any active angina.    Plan:   Continue on low-dose lisinopril and metoprolol along with Zetia and rosuvastatin.  Not requiring any as needed nitroglycerin.  Not on aspirin/Plavix because of Eliquis.   As mentioned in my previous note.  Would not do any further ischemic evaluation unless symptoms warrant.      Relevant Orders   Lipid Profile   Comprehensive metabolic panel   CBC   PAF (paroxysmal atrial fibrillation) (HCC) CHA2DS2-VASc = 4. AC = Eliquis (Chronic)    Rate controlled.  Is on low-dose metoprolol with no signs or symptoms of tachycardia or  bradycardia. Remains on Eliquis for anticoagulation.   Okay to hold Eliquis preop for procedures.  Would hold for 24 hours for minor procedure, 48 hours for moderate risk procedures, and 72 hours for high risk procedure such as neurologic/spine procedures.      Essential hypertension (Chronic)    Blood pressure looks good on current meds with lisinopril and metoprolol.  Would not titrate further.      Hyperlipidemia with target LDL less than 70 (Chronic)    Last available labs were from February 2020.  We have ordered labs to be done this week at the [facility] office.  Continue Zetia plus rosuvastatin  for now.  Labs have been controlled.      Aorto-iliac disease ([place]) (Chronic)    My understanding was that this was being followed by vascular surgery here at [facility].  However, he is now being followed by Dr. Lucky Cowboy of [facility] after EVAR with Left Iliac Stenting.  He is not necessarily having any claudication symptoms, no ulcers or lesions.      Status post abdominal aortic aneurysm (AAA) repair (Chronic)    S/P EVAR - Dr. Rhea Pink 05/2019 -> no further abdominal pain.   We will defer further follow-up to Dr. Lucky Cowboy          COVID-19 Education: The signs and symptoms of COVID-19 were discussed with the patient and how to seek care for testing (follow up with PCP or arrange E-visit).   The importance of social distancing and COVID-19 vaccination was discussed today.  I spent a total of 28 minutes with the patient. >  50% of the time was spent in direct patient consultation.  Additional time spent with chart review  / charting (studies, outside notes, etc): 25 min'  --> Multiple hospitalizations, consultations and procedures reviewed.  Peripheral vascular procedure and echocardiogram/Myoview all reviewed.  Past medical history and surgical history updated. Total Time: 32mn   Current medicines are reviewed at length with the patient today.  (+/- concerns) none  Notice: This  dictation was prepared with Dragon dictation along with smaller phrase technology. Any transcriptional errors that result from this process are unintentional and may not be corrected upon review.  Patient Instructions / Medication Changes & Studies & Tests Ordered   Patient Instructions  Medication Instructions:  The current medical regimen is effective;  continue present plan and medications as directed. Please refer to the Current Medication list given to you today. *If you need a refill on your cardiac medications before your next appointment, please call your pharmacy*  Lab Work: FASTING LIPID, CMET, CBC [facility] have labs (blood work) drawn today and your tests are completely normal, you will receive your results only by:  [place](if you have MyChart) OR A paper copy in the mail.  If you have any lab test that is abnormal or we need to change your treatment, we will call you to review the results. You may go to any [facility] that is convenient for you however, we do have a lab in our office that is able to assist you. You DO NOT need an appointment for our lab. The lab is open 8:00am and closes at 4:00pm. Lunch 12:45 - 1:45pm.  Follow-Up: Your next appointment:  6 month(s) Please call our office 2 months in advance to schedule this appointment Virtual Visit  with DGlenetta Hew MD  [facility] you and your health needs are our priority.  As part of our continuing mission to provide you with exceptional heart care, we have created designated Provider Care Teams.  These Care Teams include your primary Cardiologist (physician) and Advanced Practice Providers (APPs -  Physician Assistants and Nurse Practitioners) who all work together to provide you with the care you need, when you need it.      Studies Ordered:   Orders Placed This Encounter  Procedures  . Lipid Profile  . Comprehensive metabolic panel  . CBC     DGlenetta Hew M.D.,  M.S. Interventional Cardiologist   Pager # [phone] # [phone] [place] [address]  Thank you for choosing Heartcare at [facility]!!

## 2019-12-20 NOTE — Therapy (Signed)
[place] MAIN [facility] SERVICES [address] [name], [place], [address] Phone: [phone]   Fax:  [phone]  Physical Therapy Treatment  Patient Details  Name: [name] MRN: [id number] Date of Birth: 1933-10-26 Referring Provider (PT): Eulogio Ditch   Encounter Date: 12/20/2019  PT End of Session - 12/20/19 1241    Visit Number  84    Number of Visits  60    Date for PT Re-Evaluation  01/23/20    Authorization Type  9/10 PN 3/15    PT Start Time  1115    PT Stop Time  1159    PT Time Calculation (min)  44 min    Equipment Utilized During Treatment  Gait belt    Activity Tolerance  Patient tolerated treatment well    Behavior During Therapy  [facility] for tasks assessed/performed       Past Medical History:  Diagnosis Date  . AAA (abdominal aortic aneurysm) ([place])   . Arthritis   . Atherosclerotic heart disease of native coronary artery without angina pectoris 1998   - s/p CABG, occluded SVG-D1; patent LIMA-LAD, SVG-OM, SVG- RCA  . Basal cell carcinoma of eyelid 01/03/2013   2019 - R side of Nose  . Benign neoplasm of ascending colon   . Benign neoplasm of descending colon   . Cancer [facility])    Skin cancer  . Colon polyps   . COPD (chronic obstructive pulmonary disease) ([place])   . Coronary artery disease   . Dementia ([place])   . Dysrhythmia    atrial fib  . Falls frequently   . GERD (gastroesophageal reflux disease)   . Hemorrhoid 02/10/2015  . History of hiatal hernia   . History of kidney stones   . HOH (hard of hearing)   . Hypertension   . Memory loss   . Osteoporosis   . PAF (paroxysmal atrial fibrillation) ([place])   . Prostate cancer ([place])   . Prostate disorder   . Sciatic pain    Chronic  . Sleep apnea    cpap  . Temporary cerebral vascular dysfunction 02/10/2015   Had negative work-up.  July, 2012.  No medication changes, had forgot them that day, got dehydrated.   . Urinary incontinence     Past Surgical  History:  Procedure Laterality Date  . ABDOMINAL AORTIC ENDOVASCULAR STENT GRAFT Bilateral 05/18/2019   Procedure: ABDOMINAL AORTIC ENDOVASCULAR STENT GRAFT;  Surgeon: Algernon Huxley, MD;  Location: [facility] ORS;  Service: Vascular;  Laterality: Bilateral;  . ANKLE SURGERY    . Arch aortogram and carotid aortogram  06/02/2006   Dr Oneida Alar did surgery  . BASAL CELL CARCINOMA EXCISION  04/2016   Dermatology  . CARDIAC CATHETERIZATION  01/30/1998    (Dr. Linard Millers): Native LAD & mRCA CTO. 90% D1. Mod Cx. SVG-D1 CTO. SVG-RCA & SVG-OM along with LIMA-LAD patent;  LV NORMAL Fxn  . CAROTID ENDARTERECTOMY Left Oct. 29, 2007   Dr. Oneida Alar:  . CATARACT EXTRACTION W/PHACO Right 03/05/2017   Procedure: CATARACT EXTRACTION PHACO AND INTRAOCULAR LENS PLACEMENT (IOC);  Surgeon: Leandrew Koyanagi, MD;  Location: [facility] ORS;  Service: Ophthalmology;  Laterality: Right;  [place] 00:58.5 AP% 10.6 CDE 6.20 Fluid Pack lot # [id number]  . CATARACT EXTRACTION W/PHACO Left 04/15/2017   Procedure: CATARACT EXTRACTION PHACO AND INTRAOCULAR LENS PLACEMENT ([place])  Left;  Surgeon: Leandrew Koyanagi, MD;  Location: [place];  Service: Ophthalmology;  Laterality: Left;  . COLONOSCOPY WITH PROPOFOL N/A 09/15/2017  Procedure: COLONOSCOPY WITH PROPOFOL;  Surgeon: Lucilla Lame, MD;  Location: [facility] ENDOSCOPY;  Service: Endoscopy;  Laterality: N/A;  . COLONOSCOPY WITH PROPOFOL N/A 10/26/2018   Procedure: COLONOSCOPY WITH PROPOFOL;  Surgeon: Lucilla Lame, MD;  Location: [facility] ENDOSCOPY;  Service: Endoscopy;  Laterality: N/A;  . CORONARY ARTERY BYPASS GRAFT  1998   LIMA-LAD, SVG-OM, SVG-RPDA, SVG-DIAG  . CPET / MET  12/2012   Mild chronotropic incompetence - read 82% of predicted; also reduced effort; peak VO2 15.7 / 75% (did not reach Max effort) -- suggested ischemic response in last 1.5 minutes of exercise. Normal pulmonary function on PFTs but poor response to  . DOPPLER ECHOCARDIOGRAPHY  11/20/2010   EF =>55%; LV norm mild aortic  scelorosis  . ENDOVASCULAR REPAIR/STENT GRAFT N/A 05/18/2019   Procedure: ENDOVASCULAR REPAIR/STENT GRAFT;  Surgeon: Algernon Huxley, MD;  Location: [place] [facility] LAB;  Service: Cardiovascular;  Laterality: N/A;  . ESOPHAGOGASTRODUODENOSCOPY (EGD) WITH PROPOFOL N/A 09/15/2017   Procedure: ESOPHAGOGASTRODUODENOSCOPY (EGD) WITH PROPOFOL;  Surgeon: Lucilla Lame, MD;  Location: [facility] ENDOSCOPY;  Service: Endoscopy;  Laterality: N/A;  . FRACTURE SURGERY Right 03/19/2015   wrist  . HIATAL HERNIA REPAIR    . ILIAC ARTERY STENT  12/15/2005   PTA and direct stenting rgt and lft common iliac arteries  . KYPHOPLASTY N/A 04/05/2019   Procedure: KYPHOPLASTY T12, L3, L4 L5;  Surgeon: Hessie Knows, MD;  Location: [facility] ORS;  Service: Orthopedics;  Laterality: N/A;  . NM GATED MYOVIEW ([place] [facility])  12/2017   Low Risk - no ischemia or infarct.  EF > 65%. no RWMA  . NM MYOVIEW ([place] [facility])  11/11/2010; February 2016   a) TM STRESS----NORMAL PERFUSION ,EF 68%; b) EF 50-55%. Normal LV function. No significant ischemia or infarction.  . OPEN REDUCTION INTERNAL FIXATION (ORIF) DISTAL RADIAL FRACTURE Left 01/13/2019   Procedure: OPEN REDUCTION INTERNAL FIXATION (ORIF) DISTAL  RADIAL FRACTURE - LEFT - SLEEP APNEA;  Surgeon: Hessie Knows, MD;  Location: [facility] ORS;  Service: Orthopedics;  Laterality: Left;  . ORIF ELBOW FRACTURE Right 01/01/2017   Procedure: OPEN REDUCTION INTERNAL FIXATION (ORIF) ELBOW/OLECRANON FRACTURE;  Surgeon: Hessie Knows, MD;  Location: [facility] ORS;  Service: Orthopedics;  Laterality: Right;  . ORIF WRIST FRACTURE Right 03/19/2015   Procedure: OPEN REDUCTION INTERNAL FIXATION (ORIF) WRIST FRACTURE;  Surgeon: Hessie Knows, MD;  Location: [facility] ORS;  Service: Orthopedics;  Laterality: Right;  . THORACIC AORTA - CAROTID ANGIOGRAM  October 2007   Dr. 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 TRANSTHORACIC ECHOCARDIOGRAM  February  2016   [facility]: Normal LV function. Dilated left atrium.    There were no vitals filed for this visit.  Subjective Assessment - 12/20/19 1119    Subjective  Patient reports ankle pain in L ankle that started yesterday. No falls or LOB since last session. Hasn't been able to walk much this weekend.    Pertinent History  Patient referred to physical therapy for balance/strength issues s/p abdominal aortic aneurysm (AAA) without rupture repair on 05/18/19 .  Patient has been seen by this therapist in the past (last seen in march of 2020) Patient had a fall on 01/08/19, MRI from [facility] dated 03/28/19 report acute compression fractures at T12, L3 and L4 as well as facet arthropathy at the lower lumbar levels and at L3-4 moderate central stenosis. Patient underwent injection on 10/6 at L4-5 and L5-S1. PMH includes AAA, arthritis, atherosclerotic heart disease, (s/p  CABG), benign neoplasm of ascending/descending colon, COPD, CAD, dementia, dysrhythmia, frequent falls, GERD, hemorrhoid, HOH, HTN, PAF, prostate cancer. Monday through Friday have 3 people come in different times from 730 to 12pm. Uses a walker occasionally now    Limitations  Walking;Standing;House hold activities;Other (comment);Lifting    How long can you sit comfortably?  20 minutes before back is painful    How long can you stand comfortably?  hour    How long can you walk comfortably?  using a walker about 30 minutes    Patient Stated Goals  patient would like to improve his balance and strenght.    Currently in Pain?  Yes    Pain Score  4     Pain Location  Ankle    Pain Orientation  Left    Pain Descriptors / Indicators  Aching;Stabbing    Pain Type  Acute pain    Pain Onset  More than a month ago    Pain Frequency  Intermittent    Aggravating Factors   weightbearing          Nustep Lvl 4 RPM>60 3 minutes  Supine for nonweightbearing: GTB around knees: hooklying abduction 20x  2.5 ankle weight SLR 10x each LE to PT hand ; opp  LE in hooklying ; 2 sets  Marching 15x each LE in hooklying  SAQ over bolster 15x each LE, cues for decreasing velocity of movement.   Green swiss ball: Hamstring curls bilateral 15x Hamstring curls Single limb 10x each LE   Bridges 10x with stabilization  Seated 2.5 ankle weights -LAQ 3 second holds 10x each LE ; patient alternating LE's    Soccer ball kicks for coordination, spatial awareness, and sequencing for muscle activation timing. X 4 minutes, increase challenge to aiming between two narrowing cones on floor. Frequent posterior trunk lean requiring cueing for upright posture.   Knee to mitt held by PT, 12x each LE  Cross body punches to mitts held by PT, guided inside and outside BOS with increasing capacity for propulsion with repetition.   Green theraband:  -around ankles: lateral side step/ER 15x each LE -around knees: abduction 15x  Adduction rainbow ball squeeze 15x Heel toe raises 15x Modified windmills 10x.    Pt educated throughout session about proper posture and technique with exercises. Improved exercise technique, movement at target joints, use of target muscles after min to mod verbal, visual, tactile cues.    Patient performed session in nonweightbearing positions due to L ankle pain. Patient is challenged with maintaining upright trunk position with seated interventions with frequent posterior trunk lean with fatigue. Patient would benefit from further skilled PT intervention to increase strength and stability for safer ADL performance and increased functional ability.           PT Education - 12/20/19 1137    Education provided  Yes    Education Details  nonweightbearing strength and body mechanics    Person(s) Educated  Patient    Methods  Explanation;Demonstration;Tactile cues;Verbal cues    Comprehension  Verbalized understanding;Returned demonstration;Verbal cues required;Tactile cues required       PT Short Term Goals - 10/03/19 1312       PT SHORT TERM GOAL #1   Title  Patient will be independent in home exercise program to improve strength/mobility for better functional independence with ADLs.    Baseline  10/16: HEP given 11/17: HEP compliant    Time  4    Period  Weeks    Status  Achieved    Target Date  07/15/19      PT SHORT TERM GOAL #2   Title  Patient will deny any falls over past 4 weeks to demonstrate improved safety awareness at home and work.    Baseline  10/16: one major fall 11/17: no falls    Time  4    Period  Weeks    Status  Achieved    Target Date  07/15/19        PT Long Term Goals - 11/28/19 1642      PT LONG TERM GOAL #1   Title  Patient will increase BLE gross strength to 4+/5 as to improve functional strength for independent gait, increased standing tolerance and increased ADL ability.    Baseline  10/16: R gross 4-/5 with add/ext 3+/5 L grossly 3+/5 with hip add/ext 2+/5 11/17: R 4/5 add/ext; -/5 L 4-/5 hip add/ext 3/5 12/8: R: 4/5 L 4-/5 hip add/ext 3+/5 2/1: R 4+/5 L 4/5 with hip ext 4-/5    Time  8    Period  Weeks    Status  Partially Met    Target Date  01/23/20      PT LONG TERM GOAL #2   Title  Patient (> 61 years old) will complete five times sit to stand test in < 15 seconds indicating an increased LE strength and improved balance.    Baseline  10/16: 17 seconds with heavy use of hands on knees 11/17: 16 seconds one near LOB 12/8: 13 seconds one posterior LOB    Time  8    Period  Weeks    Status  Achieved      PT LONG TERM GOAL #3   Title  Patient will increase Berg Balance score by > 6 points (43/56)  to demonstrate decreased fall risk during functional activities.    Baseline  10/16: 37/56 11/17: 42/56 12/8: 47/56    Time  8    Period  Weeks    Status  Achieved      PT LONG TERM GOAL #4   Title  Patient will increase 10 meter walk test to >1.51ms as to improve gait speed for better community ambulation and to reduce fall risk.    Baseline  10/16: 0.91 w/o AD 11/17:  1.43 m/s with no AD    Time  8    Period  Weeks    Status  Achieved      PT LONG TERM GOAL #5   Title  Patient will increase ABC scale score >80% to demonstrate better functional mobility and better confidence with ADLs.    Baseline  10/16; 71% 11/17: 80% 12/8: 83%    Time  8    Period  Weeks    Status  Achieved      Additional Long Term Goals   Additional Long Term Goals  Yes      PT LONG TERM GOAL #6   Title  Patient will improve score on DGI to > or = 19/24 on the DGI to increase dynamic balance during mobility and to decrease risk of falls.    Baseline  12/8: perform next session 12/29: 18/24 2/1: 18/24 3/15: 20/24    Time  8    Period  Weeks    Status  Achieved      PT LONG TERM GOAL #7   Title  Patient will increase Berg Balance score by > 6 points (53/56)  to demonstrate decreased fall risk during  functional activities.    Baseline  12/8; 47/56 12/29: 49/56 2/1: 46/56 3/15: 47/56    Time  8    Period  Weeks    Status  Partially Met    Target Date  01/23/20      PT LONG TERM GOAL #8   Title  Patient will increase six minute walk test distance to >1000 for progression to community ambulator and improve gait ability    Baseline  3/29: 756 ft    Time  8    Period  Weeks    Status  New    Target Date  01/23/20            Plan - 12/20/19 1241    Clinical Impression Statement  Patient performed session in nonweightbearing positions due to L ankle pain. Patient is challenged with maintaining upright trunk position with seated interventions with frequent posterior trunk lean with fatigue. Patient would benefit from further skilled PT intervention to increase strength and stability for safer ADL performance and increased functional ability.    Personal Factors and Comorbidities  Age;Comorbidity 3+;Fitness;Past/Current Experience;Sex;Social Background;Time since onset of injury/illness/exacerbation;Transportation    Comorbidities  AAA, arthritis, atherosclerotic heart  disease, (s/p CABG), benign neoplasm of ascending/descending colon, COPD, CAD, dementia, dysrhythmia, frequent falls, GERD, hemorrhoid, HOH, HTN, PAF, prostate cancer.    Examination-Activity Limitations  Bathing;Bend;Carry;Dressing;Continence;Stairs;Squat;Reach Overhead;Locomotion Level;Lift;Stand;Transfers;Toileting    Examination-Participation Restrictions  Church;Cleaning;Community Activity;Driving;Interpersonal Relationship;Laundry;Volunteer;Shop;Personal Finances;Meal Prep;Yard Work    [employer] Potential  Fair    Clinical Impairments Affecting Rehab Potential  (+) good support system, high prior level of function (-) currently undergoing radiation therapy for prostate cancer, memory deficits     PT Frequency  2x / week    PT Duration  8 weeks    PT Treatment/Interventions  ADLs/Self Care Home Management;Aquatic Therapy;Cryotherapy;Moist Heat;Traction;Ultrasound;Therapeutic activities;Functional mobility training;Stair training;Gait training;DME Instruction;Therapeutic exercise;Balance training;Neuromuscular re-education;Patient/family education;Manual techniques;Passive range of motion;Energy conservation;Vestibular;Taping    PT Next Visit Plan  single limb stability    PT Home Exercise Plan  see above    Consulted and Agree with Plan of Care  Patient       Patient will benefit from skilled therapeutic intervention in order to improve the following deficits and impairments:  Abnormal gait, Cardiopulmonary status limiting activity, Decreased activity tolerance, Decreased balance, Decreased knowledge of precautions, Decreased endurance, Decreased coordination, Decreased cognition, Decreased knowledge of use of DME, Decreased mobility, Difficulty walking, Decreased safety awareness, Decreased strength, Impaired flexibility, Impaired perceived functional ability, Impaired sensation, Postural dysfunction, Improper body mechanics, Pain  Visit Diagnosis: Unsteadiness on feet  Muscle weakness  (generalized)  Other abnormalities of gait and mobility     Problem List Patient Active Problem List   Diagnosis Date Noted  . Status post abdominal aortic aneurysm (AAA) repair 04/15/2019  . Candidiasis 02/22/2019  . Malignant neoplasm of descending colon ([place])   . Benign neoplasm of descending colon   . Benign neoplasm of transverse colon   . Do not intubate, cardiopulmonary resuscitation (CPR)-only code status 10/08/2018  . Prostate cancer ([place]) 02/25/2018  . Prostatic cyst 01/15/2018  . 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 ([place]) 09/15/2017  . Blood in stool   . Abnormal feces   . Stricture and stenosis of esophagus   . Mild cognitive impairment 08/18/2017  . Senile purpura ([place]) 04/29/2017  . Anemia 04/29/2017  . Frequent falls 04/29/2017  . Acute respiratory failure ([place]) 12/17/2016  . Simple chronic bronchitis ([place]) 12/17/2016  . Benign localized hyperplasia of prostate with urinary obstruction 07/15/2016  .  Elevated PSA 07/15/2016  . Incomplete emptying of bladder 07/15/2016  . Urge incontinence 07/15/2016  . Hypothyroidism 04/25/2016  . Macular degeneration 04/25/2016  . Erectile dysfunction due to arterial insufficiency   . Ischemic heart disease with chronotropic incompetence   . CN (constipation) 02/10/2015  . DD (diverticular disease) 02/10/2015  . Fatigue 02/10/2015  . Lichen planus 15/83/0940  . Restless leg 02/10/2015  . 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 B12 deficiency 02/10/2015  . COPD (chronic obstructive pulmonary disease) ([place]) 11/14/2014  . Post Inflammatory Lung Changes 11/14/2014  . PAF (paroxysmal atrial fibrillation) ([facility]) CHA2DS2-VASc = 4. AC = Eliquis   . Insomnia 12/09/2013  . OSA on CPAP   . Dyspnea on exertion - -essentially resolved with BB dose reduction & wgt loss 03/29/2013  . Overweight (BMI 25.0-29.9) -- Wgt back up 01/17/2013  . Atherosclerotic heart disease of native coronary artery without angina pectoris - s/p CABG, occluded  SVG-D1; patent LIMA-LAD, SVG-OM, SVG- RCA   . Essential hypertension   . Hyperlipidemia with target LDL less than 70   . Aorto-iliac disease ([place])   . BCC (basal cell carcinoma), eyelid 01/03/2013  . Allergic rhinitis 08/17/2009  . Adaptation reaction 05/28/2009  . Acid reflux 05/28/2009  . Arthritis, degenerative 05/28/2009  . 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 Carotid artery occlusion without infarction 06/01/2006  . PAD (peripheral artery disease) - bilateral common iliac stents 12/15/2005  . Aortoiliac occlusive disease ([place]) 11/30/2005  . Atherosclerotic heart disease of artery bypass graft 09/01/1997   Janna Arch, PT, DPT   12/20/2019, 12:45 PM  [place] MAIN [facility] SERVICES [address] [place], [place], [address] Phone: [phone]   Fax:  [phone]  Name: [name] MRN: [id number] Date of Birth: 08/01/1934

## 2019-12-21 ENCOUNTER — Telehealth: Payer: Self-pay | Admitting: Cardiology

## 2019-12-21 ENCOUNTER — Other Ambulatory Visit: Payer: Self-pay

## 2019-12-21 ENCOUNTER — Telehealth: Payer: Self-pay

## 2019-12-21 DIAGNOSIS — I2510 Atherosclerotic heart disease of native coronary artery without angina pectoris: Secondary | ICD-10-CM

## 2019-12-21 NOTE — Progress Notes (Signed)
C-

## 2019-12-21 NOTE — Telephone Encounter (Signed)
Copied from [place] [id number]. Topic: General - Other >> Dec 21, 2019  3:22 PM Yvette Rack wrote: Reason for CRM: Pt spouse [name] stated Dr. Ellyn Hack has ordered blood work for pt and asked that they contact Dr. Caryn Section to see if he would like to add anything to the labs ordered.

## 2019-12-21 NOTE — Telephone Encounter (Signed)
Patient wants to have labs at [facility] .   [place] to change order to hospital.  Patient aware to come anytime .

## 2019-12-22 ENCOUNTER — Other Ambulatory Visit: Payer: Self-pay

## 2019-12-22 ENCOUNTER — Ambulatory Visit: Payer: Medicare Other

## 2019-12-22 DIAGNOSIS — M6281 Muscle weakness (generalized): Secondary | ICD-10-CM

## 2019-12-22 DIAGNOSIS — R2681 Unsteadiness on feet: Secondary | ICD-10-CM | POA: Diagnosis not present

## 2019-12-22 DIAGNOSIS — R2689 Other abnormalities of gait and mobility: Secondary | ICD-10-CM

## 2019-12-22 NOTE — Therapy (Signed)
[place] MAIN [facility] SERVICES [address] [place], [place], [address] Phone: [phone]   Fax:  [phone]  Physical Therapy Treatment Physical Therapy Progress Note   Dates of reporting period  11/14/19   to   12/22/19   Patient Details  Name: [name] MRN: [id number] Date of Birth: 84/21/1935 Referring Provider (PT): Eulogio Ditch   Encounter Date: 12/22/2019  PT End of Session - 12/22/19 1116    Visit Number  50    Number of Visits  60    Date for PT Re-Evaluation  01/23/20    Authorization Type  10/10 PN 3/15; next session 1/10 PN 4/22    PT Start Time  1113    PT Stop Time  1159    PT Time Calculation (min)  46 min    Equipment Utilized During Treatment  Gait belt    Activity Tolerance  Patient tolerated treatment well    Behavior During Therapy  WFL for tasks assessed/performed       Past Medical History:  Diagnosis Date  . AAA (abdominal aortic aneurysm) ([place])   . Arthritis   . Atherosclerotic heart disease of native coronary artery without angina pectoris 1998   - s/p CABG, occluded SVG-D1; patent LIMA-LAD, SVG-OM, SVG- RCA  . Basal cell carcinoma of eyelid 01/03/2013   2019 - R side of Nose  . Benign neoplasm of ascending colon   . Benign neoplasm of descending colon   . Cancer [facility]    Skin cancer  . Colon polyps   . COPD (chronic obstructive pulmonary disease) ([place])   . Coronary artery disease   . Dementia ([place])   . Dysrhythmia    atrial fib  . Falls frequently   . GERD (gastroesophageal reflux disease)   . Hemorrhoid 02/10/2015  . History of hiatal hernia   . History of kidney stones   . HOH (hard of hearing)   . Hypertension   . Memory loss   . Osteoporosis   . PAF (paroxysmal atrial fibrillation) ([place])   . Prostate cancer ([place])   . Prostate disorder   . Sciatic pain    Chronic  . Sleep apnea    cpap  . Temporary cerebral vascular dysfunction 02/10/2015   Had negative work-up.  July, 2012.   No medication changes, had forgot them that day, got dehydrated.   . Urinary incontinence     Past Surgical History:  Procedure Laterality Date  . ABDOMINAL AORTIC ENDOVASCULAR STENT GRAFT Bilateral 05/18/2019   Procedure: ABDOMINAL AORTIC ENDOVASCULAR STENT GRAFT;  Surgeon: Algernon Huxley, MD;  Location: [facility] ORS;  Service: Vascular;  Laterality: Bilateral;  . ANKLE SURGERY    . Arch aortogram and carotid aortogram  06/02/2006   Dr Oneida Alar did surgery  . BASAL CELL CARCINOMA EXCISION  04/2016   Dermatology  . CARDIAC CATHETERIZATION  01/30/1998    (Dr. Linard Millers): Native LAD & mRCA CTO. 90% D1. Mod Cx. SVG-D1 CTO. SVG-RCA & SVG-OM along with LIMA-LAD patent;  LV NORMAL Fxn  . CAROTID ENDARTERECTOMY Left Oct. 29, 2007   Dr. Oneida Alar:  . CATARACT EXTRACTION W/PHACO Right 03/05/2017   Procedure: CATARACT EXTRACTION PHACO AND INTRAOCULAR LENS PLACEMENT (IOC);  Surgeon: Leandrew Koyanagi, MD;  Location: [facility] ORS;  Service: Ophthalmology;  Laterality: Right;  [place] 00:58.5 AP% 10.6 CDE 6.20 Fluid Pack lot # [id number]  . CATARACT EXTRACTION W/PHACO Left 04/15/2017   Procedure: CATARACT EXTRACTION PHACO AND INTRAOCULAR LENS PLACEMENT (IOC)  Left;  Surgeon: Leandrew Koyanagi, MD;  Location: [place];  Service: Ophthalmology;  Laterality: Left;  . COLONOSCOPY WITH PROPOFOL N/A 09/15/2017   Procedure: COLONOSCOPY WITH PROPOFOL;  Surgeon: Lucilla Lame, MD;  Location: [facility] ENDOSCOPY;  Service: Endoscopy;  Laterality: N/A;  . COLONOSCOPY WITH PROPOFOL N/A 10/26/2018   Procedure: COLONOSCOPY WITH PROPOFOL;  Surgeon: Lucilla Lame, MD;  Location: [facility] ENDOSCOPY;  Service: Endoscopy;  Laterality: N/A;  . CORONARY ARTERY BYPASS GRAFT  1998   LIMA-LAD, SVG-OM, SVG-RPDA, SVG-DIAG  . CPET / MET  12/2012   Mild chronotropic incompetence - read 82% of predicted; also reduced effort; peak VO2 15.7 / 75% (did not reach Max effort) -- suggested ischemic response in last 1.5 minutes of exercise. Normal pulmonary  function on PFTs but poor response to  . DOPPLER ECHOCARDIOGRAPHY  11/20/2010   EF =>55%; LV norm mild aortic scelorosis  . ENDOVASCULAR REPAIR/STENT GRAFT N/A 05/18/2019   Procedure: ENDOVASCULAR REPAIR/STENT GRAFT;  Surgeon: Algernon Huxley, MD;  Location: [facility];  Service: Cardiovascular;  Laterality: N/A;  . ESOPHAGOGASTRODUODENOSCOPY (EGD) WITH PROPOFOL N/A 09/15/2017   Procedure: ESOPHAGOGASTRODUODENOSCOPY (EGD) WITH PROPOFOL;  Surgeon: Lucilla Lame, MD;  Location: [facility] ENDOSCOPY;  Service: Endoscopy;  Laterality: N/A;  . FRACTURE SURGERY Right 03/19/2015   wrist  . HIATAL HERNIA REPAIR    . ILIAC ARTERY STENT  12/15/2005   PTA and direct stenting rgt and lft common iliac arteries  . KYPHOPLASTY N/A 04/05/2019   Procedure: KYPHOPLASTY T12, L3, L4 L5;  Surgeon: Hessie Knows, MD;  Location: [facility] ORS;  Service: Orthopedics;  Laterality: N/A;  . NM GATED MYOVIEW ([place] [facility])  12/2017   Low Risk - no ischemia or infarct.  EF > 65%. no RWMA  . NM MYOVIEW ([place] [facility])  11/11/2010; February 2016   a) TM STRESS----NORMAL PERFUSION ,EF 68%; b) EF 50-55%. Normal LV function. No significant ischemia or infarction.  . OPEN REDUCTION INTERNAL FIXATION (ORIF) DISTAL RADIAL FRACTURE Left 01/13/2019   Procedure: OPEN REDUCTION INTERNAL FIXATION (ORIF) DISTAL  RADIAL FRACTURE - LEFT - SLEEP APNEA;  Surgeon: Hessie Knows, MD;  Location: [facility] ORS;  Service: Orthopedics;  Laterality: Left;  . ORIF ELBOW FRACTURE Right 01/01/2017   Procedure: OPEN REDUCTION INTERNAL FIXATION (ORIF) ELBOW/OLECRANON FRACTURE;  Surgeon: Hessie Knows, MD;  Location: [facility] ORS;  Service: Orthopedics;  Laterality: Right;  . ORIF WRIST FRACTURE Right 03/19/2015   Procedure: OPEN REDUCTION INTERNAL FIXATION (ORIF) WRIST FRACTURE;  Surgeon: Hessie Knows, MD;  Location: [facility] ORS;  Service: Orthopedics;  Laterality: Right;  . THORACIC AORTA - CAROTID ANGIOGRAM  October 2007   Dr. 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 TRANSTHORACIC ECHOCARDIOGRAM  February 2016   [facility]: Normal LV function. Dilated left atrium.    There were no vitals filed for this visit.  Subjective Assessment - 12/22/19 1117    Subjective  Patient reports his appointment with the cardiologist went well. No falls or LOB since last session.    Pertinent History  Patient referred to physical therapy for balance/strength issues s/p abdominal aortic aneurysm (AAA) without rupture repair on 05/18/19 .  Patient has been seen by this therapist in the past (last seen in march of 2020) Patient had a fall on 01/08/19, [facility] dated [facility]28[facility]19 report acute compression fractures at T12, L3 and L4 as well as facet arthropathy at the lower lumbar levels and at L3-4 moderate central stenosis. Patient underwent  injection on 10/6 at L4-5 and L5-S1. PMH includes AAA, arthritis, atherosclerotic heart disease, (s/p CABG), benign neoplasm of ascending/descending colon, COPD, CAD, dementia, dysrhythmia, frequent falls, GERD, hemorrhoid, HOH, HTN, PAF, prostate cancer. Monday through Friday have 3 people come in different times from 730 to 12pm. Uses a walker occasionally now    Limitations  Walking;Standing;House hold activities;Other (comment);Lifting    How long can you sit comfortably?  20 minutes before back is painful    How long can you stand comfortably?  hour    How long can you walk comfortably?  using a walker about 30 minutes    Patient Stated Goals  patient would like to improve his balance and strenght.    Currently in Pain?  Yes    Pain Score  2     Pain Location  Ankle    Pain Orientation  Left    Pain Descriptors / Indicators  Aching;Stabbing    Pain Type  Acute pain    Pain Onset  More than a month ago    Pain Frequency  Intermittent         [facility] PT Assessment - 12/22/19 0001      Standardized Balance Assessment   Standardized Balance Assessment  Berg Balance  Test;Dynamic Gait Index      Berg Balance Test   Sit to Stand  Able to stand without using hands and stabilize independently    Standing Unsupported  Able to stand safely 2 minutes    Sitting with Back Unsupported but Feet Supported on Floor or Stool  Able to sit safely and securely 2 minutes    Stand to Sit  Sits safely with minimal use of hands    Transfers  Able to transfer safely, minor use of hands    Standing Unsupported with Eyes Closed  Able to stand 10 seconds with supervision    Standing Unsupported with Feet Together  Able to place feet together independently and stand 1 minute safely    From Standing, Reach Forward with Outstretched Arm  Can reach forward >12 cm safely (5")    From Standing Position, Pick up Object from Floor  Able to pick up shoe safely and easily    From Standing Position, Turn to Look Behind Over each Shoulder  Looks behind from both sides and weight shifts well    Turn 360 Degrees  Able to turn 360 degrees safely in 4 seconds or less    Standing Unsupported, Alternately Place Feet on Step/Stool  Able to stand independently and safely and complete 8 steps in 20 seconds    Standing Unsupported, One Foot in Front  Able to take small step independently and hold 30 seconds    Standing on One Leg  Able to lift leg independently and hold equal to or more than 3 seconds    Total Score  50         Goals:  BERG : 50/ 56 6 minute walk test: 770 ft with ankle pain   Treatment: Nustep Lvl 3 RPM> 60 for cardiovascular challenge, 3 minutes   Additional HEP for back, posture, and strength   Access Code: [email or web address] URL: [email or web address] Date: 12/22/2019 Prepared by: Janna Arch  Exercises Prone Press Up on Elbows - 1 x daily - 7 x weekly - 10 reps - 2 sets - 5 hold Prone Knee Flexion - 1 x daily - 7 x weekly - 10 reps - 2 sets - 5 hold Bridge - 1 x daily -  7 x weekly - 10 reps - 2 sets - 5 hold Hooklying Lumbar Rotation - 1 x daily - 7 x  weekly - 10 reps - 2 sets - 5 hold      Patient's condition has the potential to improve in response to therapy. Maximum improvement is yet to be obtained. The anticipated improvement is attainable and reasonable in a generally predictable time.  Patient reports he is able to walk more and feels more steady. Not where he wants to be yet but feels like he is getting closer.             PT Education - 12/22/19 1117    Education provided  Yes    Education Details  goals, exercise technique    Person(s) Educated  Patient    Methods  Explanation;Demonstration;Tactile cues;Verbal cues    Comprehension  Verbalized understanding;Returned demonstration;Verbal cues required;Tactile cues required       PT Short Term Goals - 10/03/19 1312      PT SHORT TERM GOAL #1   Title  Patient will be independent in home exercise program to improve strength/mobility for better functional independence with ADLs.    Baseline  10/16: HEP given 11/17: HEP compliant    Time  4    Period  Weeks    Status  Achieved    Target Date  07/15/19      PT SHORT TERM GOAL #2   Title  Patient will deny any falls over past 4 weeks to demonstrate improved safety awareness at home and work.    Baseline  10/16: one major fall 11/17: no falls    Time  4    Period  Weeks    Status  Achieved    Target Date  07/15/19        PT Long Term Goals - 12/22/19 1236      PT LONG TERM GOAL #1   Title  Patient will increase BLE gross strength to 4+/5 as to improve functional strength for independent gait, increased standing tolerance and increased ADL ability.    Baseline  10/16: R gross 4-/5 with add/ext 3+/5 L grossly 3+/5 with hip add/ext 2+/5 11/17: R 4/5 add/ext; -/5 L 4-/5 hip add/ext 3/5 12/8: R: 4/5 L 4-/5 hip add/ext 3+/5 2/1: R 4+/5 L 4/5 with hip ext 4-/5    Time  8    Period  Weeks    Status  Partially Met    Target Date  01/23/20      PT LONG TERM GOAL #2   Title  Patient (> 74 years old) will complete  five times sit to stand test in < 15 seconds indicating an increased LE strength and improved balance.    Baseline  10/16: 17 seconds with heavy use of hands on knees 11/17: 16 seconds one near LOB 12/8: 13 seconds one posterior LOB    Time  8    Period  Weeks    Status  Achieved      PT LONG TERM GOAL #3   Title  Patient will increase Berg Balance score by > 6 points (43/56)  to demonstrate decreased fall risk during functional activities.    Baseline  10/16: 37/56 11/17: 42/56 12/8: 47/56    Time  8    Period  Weeks    Status  Achieved      PT LONG TERM GOAL #4   Title  Patient will increase 10 meter walk test to >1.86ms as to  improve gait speed for better community ambulation and to reduce fall risk.    Baseline  10/16: 0.91 w/o AD 11/17: 1.43 m/s with no AD    Time  8    Period  Weeks    Status  Achieved      PT LONG TERM GOAL #5   Title  Patient will increase ABC scale score >80% to demonstrate better functional mobility and better confidence with ADLs.    Baseline  10/16; 71% 11/17: 80% 12/8: 83%    Time  8    Period  Weeks    Status  Achieved      PT LONG TERM GOAL #6   Title  Patient will improve score on DGI to > or = 19/24 on the DGI to increase dynamic balance during mobility and to decrease risk of falls.    Baseline  12/8: perform next session 12/29: 18/24 2/1: 18/24 3/15: 20/24    Time  8    Period  Weeks    Status  Achieved      PT LONG TERM GOAL #7   Title  Patient will increase Berg Balance score by > 6 points (53/56)  to demonstrate decreased fall risk during functional activities.    Baseline  12/8; 47/56 12/29: 49/56 2/1: 46/56 3/15: 47/56 4/22: 50/56    Time  8    Period  Weeks    Status  Partially Met    Target Date  01/23/20      PT LONG TERM GOAL #8   Title  Patient will increase six minute walk test distance to >1000 for progression to community ambulator and improve gait ability    Baseline  3/29: 756 ft 4/22: 770 ft    Time  8    Period  Weeks     Status  Partially Met    Target Date  01/23/20            Plan - 12/22/19 1232    Clinical Impression Statement  Patient's balance is improving with BERG increase to 50/56. His six minute walk test was slightly improved despite having L ankle pain and back pain.  Hep was created for reduction of low back pain, improved posture in standing and ambulation and posterior chain strengthening. Patient's condition has the potential to improve in response to therapy. Maximum improvement is yet to be obtained. The anticipated improvement is attainable and reasonable in a generally predictable time.  Patient would benefit from further skilled PT intervention to increase strength and stability for safer ADL performance and increased functional ability.    Personal Factors and Comorbidities  Age;Comorbidity 3+;Fitness;Past/Current Experience;Sex;Social Background;Time since onset of injury/illness/exacerbation;Transportation    Comorbidities  AAA, arthritis, atherosclerotic heart disease, (s/p CABG), benign neoplasm of ascending/descending colon, COPD, CAD, dementia, dysrhythmia, frequent falls, GERD, hemorrhoid, HOH, HTN, PAF, prostate cancer.    Examination-Activity Limitations  Bathing;Bend;Carry;Dressing;Continence;Stairs;Squat;Reach Overhead;Locomotion Level;Lift;Stand;Transfers;Toileting    Examination-Participation Restrictions  Church;Cleaning;Community Activity;Driving;Interpersonal Relationship;Laundry;Volunteer;Shop;Personal Finances;Meal Prep;Yard Work    [employer] Potential  Fair    Clinical Impairments Affecting Rehab Potential  (+) good support system, high prior level of function (-) currently undergoing radiation therapy for prostate cancer, memory deficits     PT Frequency  2x / week    PT Duration  8 weeks    PT Treatment/Interventions  ADLs/Self Care Home Management;Aquatic Therapy;Cryotherapy;Moist Heat;Traction;Ultrasound;Therapeutic activities;Functional mobility training;Stair  training;Gait training;DME Instruction;Therapeutic exercise;Balance training;Neuromuscular re-education;Patient/family education;Manual techniques;Passive range of motion;Energy conservation;Vestibular;Taping    PT Next Visit Plan  single  limb stability    PT Home Exercise Plan  see above    Consulted and Agree with Plan of Care  Patient       Patient will benefit from skilled therapeutic intervention in order to improve the following deficits and impairments:  Abnormal gait, Cardiopulmonary status limiting activity, Decreased activity tolerance, Decreased balance, Decreased knowledge of precautions, Decreased endurance, Decreased coordination, Decreased cognition, Decreased knowledge of use of DME, Decreased mobility, Difficulty walking, Decreased safety awareness, Decreased strength, Impaired flexibility, Impaired perceived functional ability, Impaired sensation, Postural dysfunction, Improper body mechanics, Pain  Visit Diagnosis: Unsteadiness on feet  Muscle weakness (generalized)  Other abnormalities of gait and mobility     Problem List Patient Active Problem List   Diagnosis Date Noted  . Status post abdominal aortic aneurysm (AAA) repair 04/15/2019  . Candidiasis 02/22/2019  . Malignant neoplasm of descending colon ([place])   . Benign neoplasm of descending colon   . Benign neoplasm of transverse colon   . Do not intubate, cardiopulmonary resuscitation (CPR)-only code status 10/08/2018  . Prostate cancer ([place]) 02/25/2018  . Prostatic cyst 01/15/2018  . 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 ([place]) 09/15/2017  . Blood in stool   . Abnormal feces   . Stricture and stenosis of esophagus   . Mild cognitive impairment 08/18/2017  . Senile purpura ([place]) 04/29/2017  . Anemia 04/29/2017  . Frequent falls 04/29/2017  . Acute respiratory failure ([place]) 12/17/2016  . Simple chronic bronchitis ([place]) 12/17/2016  . Benign localized hyperplasia of prostate with urinary obstruction 07/15/2016  .  Elevated PSA 07/15/2016  . Incomplete emptying of bladder 07/15/2016  . Urge incontinence 07/15/2016  . Hypothyroidism 04/25/2016  . Macular degeneration 04/25/2016  . Erectile dysfunction due to arterial insufficiency   . Ischemic heart disease with chronotropic incompetence   . CN (constipation) 02/10/2015  . DD (diverticular disease) 02/10/2015  . Fatigue 02/10/2015  . Lichen planus 99/77/4142  . Restless leg 02/10/2015  . 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 B12 deficiency 02/10/2015  . COPD (chronic obstructive pulmonary disease) ([place]) 11/14/2014  . Post Inflammatory Lung Changes 11/14/2014  . PAF (paroxysmal atrial fibrillation) ([facility]) CHA2DS2-VASc = 4. AC = Eliquis   . Insomnia 12/09/2013  . OSA on CPAP   . Dyspnea on exertion - -essentially resolved with BB dose reduction & wgt loss 03/29/2013  . Overweight (BMI 25.0-29.9) -- Wgt back up 01/17/2013  . Atherosclerotic heart disease of native coronary artery without angina pectoris - s/p CABG, occluded SVG-D1; patent LIMA-LAD, SVG-OM, SVG- RCA   . Essential hypertension   . Hyperlipidemia with target LDL less than 70   . Aorto-iliac disease ([place])   . BCC (basal cell carcinoma), eyelid 01/03/2013  . Allergic rhinitis 08/17/2009  . Adaptation reaction 05/28/2009  . Acid reflux 05/28/2009  . Arthritis, degenerative 05/28/2009  . 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 Carotid artery occlusion without infarction 06/01/2006  . PAD (peripheral artery disease) - bilateral common iliac stents 12/15/2005  . Aortoiliac occlusive disease ([place]) 11/30/2005  . Atherosclerotic heart disease of artery bypass graft 09/01/1997   Janna Arch, PT, DPT   12/22/2019, 12:38 PM  [place] MAIN [facility] SERVICES [address] [place], [place], [address] Phone: [phone]   Fax:  [phone]  Name: DARRYEL DIODATO MRN: [id number] Date of Birth: 08-24-1934

## 2019-12-23 NOTE — Telephone Encounter (Signed)
No, there are no additional labs that I would need ordered

## 2019-12-24 ENCOUNTER — Other Ambulatory Visit: Payer: Self-pay

## 2019-12-24 ENCOUNTER — Other Ambulatory Visit
Admission: RE | Admit: 2019-12-24 | Discharge: 2019-12-24 | Disposition: A | Discharge status: Home / Self Care | Payer: Medicare Other | Source: Ambulatory Visit | Attending: Cardiology | Admitting: Cardiology

## 2019-12-24 DIAGNOSIS — I10 Essential (primary) hypertension: Secondary | ICD-10-CM | POA: Insufficient documentation

## 2019-12-24 DIAGNOSIS — I2510 Atherosclerotic heart disease of native coronary artery without angina pectoris: Secondary | ICD-10-CM

## 2019-12-24 DIAGNOSIS — Z85038 Personal history of other malignant neoplasm of large intestine: Secondary | ICD-10-CM | POA: Insufficient documentation

## 2019-12-24 DIAGNOSIS — F0390 Unspecified dementia without behavioral disturbance: Secondary | ICD-10-CM | POA: Insufficient documentation

## 2019-12-24 DIAGNOSIS — E785 Hyperlipidemia, unspecified: Secondary | ICD-10-CM | POA: Insufficient documentation

## 2019-12-24 DIAGNOSIS — N136 Pyonephrosis: Secondary | ICD-10-CM | POA: Diagnosis not present

## 2019-12-24 DIAGNOSIS — J449 Chronic obstructive pulmonary disease, unspecified: Secondary | ICD-10-CM | POA: Insufficient documentation

## 2019-12-24 DIAGNOSIS — Z79899 Other long term (current) drug therapy: Secondary | ICD-10-CM | POA: Insufficient documentation

## 2019-12-24 DIAGNOSIS — Z888 Allergy status to other drugs, medicaments and biological substances status: Secondary | ICD-10-CM | POA: Insufficient documentation

## 2019-12-24 DIAGNOSIS — I480 Paroxysmal atrial fibrillation: Secondary | ICD-10-CM | POA: Insufficient documentation

## 2019-12-24 DIAGNOSIS — Z951 Presence of aortocoronary bypass graft: Secondary | ICD-10-CM | POA: Insufficient documentation

## 2019-12-24 DIAGNOSIS — N23 Unspecified renal colic: Secondary | ICD-10-CM | POA: Insufficient documentation

## 2019-12-24 DIAGNOSIS — Z885 Allergy status to narcotic agent status: Secondary | ICD-10-CM | POA: Insufficient documentation

## 2019-12-24 DIAGNOSIS — Z8546 Personal history of malignant neoplasm of prostate: Secondary | ICD-10-CM | POA: Insufficient documentation

## 2019-12-24 DIAGNOSIS — Z87891 Personal history of nicotine dependence: Secondary | ICD-10-CM | POA: Insufficient documentation

## 2019-12-24 LAB — COMPREHENSIVE METABOLIC PANEL
ALT: 12 U/L (ref 0–44)
ALT: 12 U/L (ref 0–44)
AST: 17 U/L (ref 15–41)
AST: 18 U/L (ref 15–41)
Albumin: 3.9 g/dL (ref 3.5–5.0)
Albumin: 4.1 g/dL (ref 3.5–5.0)
Alkaline Phosphatase: 88 U/L (ref 38–126)
Alkaline Phosphatase: 99 U/L (ref 38–126)
Anion gap: 7 (ref 5–15)
Anion gap: 11 (ref 5–15)
BUN: 20 mg/dL (ref 8–23)
BUN: 23 mg/dL (ref 8–23)
CO2: 23 mmol/L (ref 22–32)
CO2: 22 mmol/L (ref 22–32)
Calcium: 8.9 mg/dL (ref 8.9–10.3)
Calcium: 9.5 mg/dL (ref 8.9–10.3)
Chloride: 109 mmol/L (ref 98–111)
Chloride: 108 mmol/L (ref 98–111)
Creatinine, Ser: 0.89 mg/dL (ref 0.61–1.24)
Creatinine, Ser: 1.09 mg/dL (ref 0.61–1.24)
GFR calc Af Amer: >60 mL/min (ref >60)
GFR calc Af Amer: >60 mL/min (ref >60)
GFR calc non Af Amer: >60 mL/min (ref >60)
GFR calc non Af Amer: >60 mL/min (ref >60)
Glucose, Bld: 95 mg/dL (ref 70–99)
Glucose, Bld: 112 mg/dL — ABNORMAL HIGH (ref 70–99)
Potassium: 4.2 mmol/L (ref 3.5–5.1)
Potassium: 3.8 mmol/L (ref 3.5–5.1)
Sodium: 139 mmol/L (ref 135–145)
Sodium: 141 mmol/L (ref 135–145)
Total Bilirubin: 1 mg/dL (ref 0.3–1.2)
Total Bilirubin: 1 mg/dL (ref 0.3–1.2)
Total Protein: 6.6 g/dL (ref 6.5–8.1)
Total Protein: 7.2 g/dL (ref 6.5–8.1)

## 2019-12-24 LAB — CBC
HCT: 37.7 % — ABNORMAL LOW (ref 39.0–52.0)
HCT: 41.1 % (ref 39.0–52.0)
Hemoglobin: 12.2 g/dL — ABNORMAL LOW (ref 13.0–17.0)
Hemoglobin: 13.5 g/dL (ref 13.0–17.0)
MCH: 30.7 pg (ref 26.0–34.0)
MCH: 30.8 pg (ref 26.0–34.0)
MCHC: 32.4 g/dL (ref 30.0–36.0)
MCHC: 32.8 g/dL (ref 30.0–36.0)
MCV: 94.7 fL (ref 80.0–100.0)
MCV: 93.8 fL (ref 80.0–100.0)
Platelets: 133 10*3/uL — ABNORMAL LOW (ref 150–400)
Platelets: 149 10*3/uL — ABNORMAL LOW (ref 150–400)
RBC: 3.98 MIL/uL — ABNORMAL LOW (ref 4.22–5.81)
RBC: 4.38 MIL/uL (ref 4.22–5.81)
RDW: 14.7 % (ref 11.5–15.5)
RDW: 14.6 % (ref 11.5–15.5)
WBC: 7.8 10*3/uL (ref 4.0–10.5)
WBC: 9.4 10*3/uL (ref 4.0–10.5)
nRBC: 0 % (ref 0.0–0.2)
nRBC: 0 % (ref 0.0–0.2)

## 2019-12-24 LAB — LIPID PANEL
Cholesterol: 119 mg/dL (ref 0–200)
HDL: 39 mg/dL — ABNORMAL LOW (ref >40)
LDL Cholesterol: 62 mg/dL (ref 0–99)
Total CHOL/HDL Ratio: 3.1 RATIO
Triglycerides: 90 mg/dL (ref <150)
VLDL: 18 mg/dL (ref 0–40)

## 2019-12-24 LAB — LIPASE, BLOOD: Lipase: 23 U/L (ref 11–51)

## 2019-12-24 MED ORDER — ONDANSETRON 4 MG PO TBDP: 4.0000 mg | ORAL_TABLET | Freq: Once | ORAL | Status: DC | PRN

## 2019-12-24 MED ORDER — ONDANSETRON HCL 4 MG/2ML IJ SOLN: 4.0000 mg | Freq: Once | INTRAMUSCULAR | Status: DC | PRN

## 2019-12-24 NOTE — ED Triage Notes (Signed)
LLQ abdominal pain, sharp and constant that started today at 1700. Pt reports emesis and is having emesis in triage. Denies urinary sx. Denies constipation. Reports recently loose BM but not diarrhea.

## 2019-12-25 ENCOUNTER — Emergency Department
Admission: EM | Admit: 2019-12-25 | Discharge: 2019-12-25 | Discharge status: Against Medical Advice | Payer: Medicare Other | Source: Home / Self Care

## 2019-12-25 ENCOUNTER — Emergency Department
Admission: EM | Admit: 2019-12-25 | Discharge: 2019-12-25 | Disposition: A | Discharge status: Home / Self Care | Payer: Medicare Other | Source: Home / Self Care | Attending: Emergency Medicine | Admitting: Emergency Medicine

## 2019-12-25 ENCOUNTER — Encounter: Payer: Self-pay | Admitting: Emergency Medicine

## 2019-12-25 ENCOUNTER — Emergency Department: Payer: Medicare Other

## 2019-12-25 ENCOUNTER — Other Ambulatory Visit: Payer: Self-pay

## 2019-12-25 DIAGNOSIS — Z951 Presence of aortocoronary bypass graft: Secondary | ICD-10-CM | POA: Insufficient documentation

## 2019-12-25 DIAGNOSIS — I480 Paroxysmal atrial fibrillation: Secondary | ICD-10-CM | POA: Insufficient documentation

## 2019-12-25 DIAGNOSIS — Z7901 Long term (current) use of anticoagulants: Secondary | ICD-10-CM | POA: Insufficient documentation

## 2019-12-25 DIAGNOSIS — N23 Unspecified renal colic: Secondary | ICD-10-CM | POA: Insufficient documentation

## 2019-12-25 DIAGNOSIS — E039 Hypothyroidism, unspecified: Secondary | ICD-10-CM | POA: Insufficient documentation

## 2019-12-25 DIAGNOSIS — Z8546 Personal history of malignant neoplasm of prostate: Secondary | ICD-10-CM | POA: Insufficient documentation

## 2019-12-25 DIAGNOSIS — J449 Chronic obstructive pulmonary disease, unspecified: Secondary | ICD-10-CM | POA: Insufficient documentation

## 2019-12-25 DIAGNOSIS — I10 Essential (primary) hypertension: Secondary | ICD-10-CM | POA: Insufficient documentation

## 2019-12-25 DIAGNOSIS — F0390 Unspecified dementia without behavioral disturbance: Secondary | ICD-10-CM | POA: Insufficient documentation

## 2019-12-25 DIAGNOSIS — Z87891 Personal history of nicotine dependence: Secondary | ICD-10-CM | POA: Insufficient documentation

## 2019-12-25 DIAGNOSIS — I2510 Atherosclerotic heart disease of native coronary artery without angina pectoris: Secondary | ICD-10-CM | POA: Insufficient documentation

## 2019-12-25 DIAGNOSIS — Z85828 Personal history of other malignant neoplasm of skin: Secondary | ICD-10-CM | POA: Insufficient documentation

## 2019-12-25 DIAGNOSIS — Z955 Presence of coronary angioplasty implant and graft: Secondary | ICD-10-CM | POA: Insufficient documentation

## 2019-12-25 LAB — URINALYSIS, COMPLETE (UACMP) WITH MICROSCOPIC
Bacteria, UA: NONE SEEN
Bilirubin Urine: NEGATIVE
Glucose, UA: NEGATIVE mg/dL
Ketones, ur: NEGATIVE mg/dL
Leukocytes,Ua: NEGATIVE
Nitrite: NEGATIVE
Protein, ur: 30 mg/dL — AB
Specific Gravity, Urine: 1.025 (ref 1.005–1.030)
pH: 5.5 (ref 5.0–8.0)

## 2019-12-25 LAB — COMPREHENSIVE METABOLIC PANEL
ALT: 13 U/L (ref 0–44)
AST: 19 U/L (ref 15–41)
Albumin: 4.1 g/dL (ref 3.5–5.0)
Alkaline Phosphatase: 88 U/L (ref 38–126)
Anion gap: 8 (ref 5–15)
BUN: 20 mg/dL (ref 8–23)
CO2: 25 mmol/L (ref 22–32)
Calcium: 9.3 mg/dL (ref 8.9–10.3)
Chloride: 105 mmol/L (ref 98–111)
Creatinine, Ser: 1.17 mg/dL (ref 0.61–1.24)
GFR calc Af Amer: >60 mL/min (ref >60)
GFR calc non Af Amer: 57 mL/min — ABNORMAL LOW (ref >60)
Glucose, Bld: 120 mg/dL — ABNORMAL HIGH (ref 70–99)
Potassium: 4.2 mmol/L (ref 3.5–5.1)
Sodium: 138 mmol/L (ref 135–145)
Total Bilirubin: 1.9 mg/dL — ABNORMAL HIGH (ref 0.3–1.2)
Total Protein: 6.9 g/dL (ref 6.5–8.1)

## 2019-12-25 LAB — CBC
HCT: 41 % (ref 39.0–52.0)
Hemoglobin: 13.8 g/dL (ref 13.0–17.0)
MCH: 30.5 pg (ref 26.0–34.0)
MCHC: 33.7 g/dL (ref 30.0–36.0)
MCV: 90.7 fL (ref 80.0–100.0)
Platelets: 141 10*3/uL — ABNORMAL LOW (ref 150–400)
RBC: 4.52 MIL/uL (ref 4.22–5.81)
RDW: 14.7 % (ref 11.5–15.5)
WBC: 14 10*3/uL — ABNORMAL HIGH (ref 4.0–10.5)
nRBC: 0 % (ref 0.0–0.2)

## 2019-12-25 LAB — LIPASE, BLOOD: Lipase: 17 U/L (ref 11–51)

## 2019-12-25 MED ORDER — ONDANSETRON 4 MG PO TBDP
4.0000 mg | ORAL_TABLET | Freq: Once | ORAL | Status: AC
  Administered 2019-12-25: 4 mg via ORAL
  Filled 2019-12-25: qty 1

## 2019-12-25 MED ORDER — ONDANSETRON 4 MG PO TBDP
4.0000 mg | ORAL_TABLET | Freq: Three times a day (TID) | ORAL | 0 refills | Status: DC | PRN
Start: 2019-12-25 — End: 2020-01-09

## 2019-12-25 MED ORDER — IOHEXOL 300 MG/ML  SOLN
100.0000 mL | Freq: Once | INTRAMUSCULAR | Status: AC | PRN
  Administered 2019-12-25: 18:00:00 100 mL via INTRAVENOUS

## 2019-12-25 MED ORDER — ACETAMINOPHEN 500 MG PO TABS
1000.0000 mg | ORAL_TABLET | Freq: Once | ORAL | Status: AC
  Administered 2019-12-25: 1000 mg via ORAL
  Filled 2019-12-25: qty 2

## 2019-12-25 MED ORDER — CEPHALEXIN 250 MG PO CAPS: 250.0000 mg | ORAL_CAPSULE | Freq: Four times a day (QID) | ORAL | 0 refills | Status: AC

## 2019-12-25 NOTE — ED Notes (Signed)
Patient's family up to stat desk inquiring about wait time.  Process explained and family member verbalized understanding.

## 2019-12-25 NOTE — ED Triage Notes (Signed)
Pt here for LLQ pain. Left before seen yesterday. Vomit 2-3 times today per family. Pt has mild dementia, disoriented to time.  Unlabored. VSS at this time. No diarrhea.  Last BM yesterday.  Hx of diverticulitis per family.

## 2019-12-25 NOTE — ED Triage Notes (Signed)
FIRST NURSE NOTE- here for LLQ abdominal pain. Left before seen last night. Went to UC today but brought over to ED for evaluation. Pt has dementia, wife with pt.

## 2019-12-25 NOTE — ED Provider Notes (Signed)
[facility] Emergency Department Provider Note       Time seen: ----------------------------------------- 4:53 PM on 12/25/2019 -----------------------------------------   I have reviewed the triage vital signs and the nursing notes.  HISTORY   Chief Complaint Abdominal Pain    HPI [name] is a 86 y.o. male with a history of AAA, arthritis, COPD, dementia, GERD, hypertension, who presents to the ED for left lower quadrant abdominal pain.  Patient left before being seen yesterday.  He has vomited 2-3 times according to the family.  He went to urgent care today but was brought to the ER for evaluation.  Past Medical History:  Diagnosis Date  . AAA (abdominal aortic aneurysm) ([place])   . Arthritis   . Atherosclerotic heart disease of native coronary artery without angina pectoris 1998   - s/p CABG, occluded SVG-D1; patent LIMA-LAD, SVG-OM, SVG- RCA  . Basal cell carcinoma of eyelid 01/03/2013   2019 - R side of Nose  . Benign neoplasm of ascending colon   . Benign neoplasm of descending colon   . Cancer [facility])    Skin cancer  . Colon polyps   . COPD (chronic obstructive pulmonary disease) ([place])   . Coronary artery disease   . Dementia ([place])   . Dysrhythmia    atrial fib  . Falls frequently   . GERD (gastroesophageal reflux disease)   . Hemorrhoid 02/10/2015  . History of hiatal hernia   . History of kidney stones   . HOH (hard of hearing)   . Hypertension   . Memory loss   . Osteoporosis   . PAF (paroxysmal atrial fibrillation) ([place])   . Prostate cancer ([place])   . Prostate disorder   . Sciatic pain    Chronic  . Sleep apnea    cpap  . Temporary cerebral vascular dysfunction 02/10/2015   Had negative work-up.  July, 2012.  No medication changes, had forgot them that day, got dehydrated.   . Urinary incontinence     Patient Active Problem List   Diagnosis Date Noted  . Status post abdominal aortic aneurysm (AAA) repair 04/15/2019   . Candidiasis 02/22/2019  . Malignant neoplasm of descending colon ([place])   . Benign neoplasm of descending colon   . Benign neoplasm of transverse colon   . Do not intubate, cardiopulmonary resuscitation (CPR)-only code status 10/08/2018  . Prostate cancer ([place]) 02/25/2018  . Prostatic cyst 01/15/2018  . 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 ([place]) 09/15/2017  . Blood in stool   . Abnormal feces   . Stricture and stenosis of esophagus   . Mild cognitive impairment 08/18/2017  . Senile purpura ([place]) 04/29/2017  . Anemia 04/29/2017  . Frequent falls 04/29/2017  . Acute respiratory failure ([place]) 12/17/2016  . Simple chronic bronchitis ([place]) 12/17/2016  . Benign localized hyperplasia of prostate with urinary obstruction 07/15/2016  . Elevated PSA 07/15/2016  . Incomplete emptying of bladder 07/15/2016  . Urge incontinence 07/15/2016  . Hypothyroidism 04/25/2016  . Macular degeneration 04/25/2016  . Erectile dysfunction due to arterial insufficiency   . Ischemic heart disease with chronotropic incompetence   . CN (constipation) 02/10/2015  . DD (diverticular disease) 02/10/2015  . Fatigue 02/10/2015  . Lichen planus 81/19/1478  . Restless leg 02/10/2015  . 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 B12 deficiency 02/10/2015  . COPD (chronic obstructive pulmonary disease) ([place]) 11/14/2014  . Post Inflammatory Lung Changes 11/14/2014  . PAF (paroxysmal atrial fibrillation) ([facility]) CHA2DS2-VASc = 4. AC = Eliquis   .  Insomnia 12/09/2013  . OSA on CPAP   . Dyspnea on exertion - -essentially resolved with BB dose reduction & wgt loss 03/29/2013  . Overweight (BMI 25.0-29.9) -- Wgt back up 01/17/2013  . Atherosclerotic heart disease of native coronary artery without angina pectoris - s/p CABG, occluded SVG-D1; patent LIMA-LAD, SVG-OM, SVG- RCA   . Essential hypertension   . Hyperlipidemia with target LDL less than 70   . Aorto-iliac disease ([place])   . BCC (basal cell carcinoma), eyelid 01/03/2013   . Allergic rhinitis 08/17/2009  . Adaptation reaction 05/28/2009  . Acid reflux 05/28/2009  . Arthritis, degenerative 05/28/2009  . 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 Carotid artery occlusion without infarction 06/01/2006  . PAD (peripheral artery disease) - bilateral common iliac stents 12/15/2005  . Aortoiliac occlusive disease ([place]) 11/30/2005  . Atherosclerotic heart disease of artery bypass graft 09/01/1997    Past Surgical History:  Procedure Laterality Date  . ABDOMINAL AORTIC ENDOVASCULAR STENT GRAFT Bilateral 05/18/2019   Procedure: ABDOMINAL AORTIC ENDOVASCULAR STENT GRAFT;  Surgeon: Algernon Huxley, MD;  Location: [facility] ORS;  Service: Vascular;  Laterality: Bilateral;  . ANKLE SURGERY    . Arch aortogram and carotid aortogram  06/02/2006   Dr Oneida Alar did surgery  . BASAL CELL CARCINOMA EXCISION  04/2016   Dermatology  . CARDIAC CATHETERIZATION  01/30/1998    (Dr. Linard Millers): Native LAD & mRCA CTO. 90% D1. Mod Cx. SVG-D1 CTO. SVG-RCA & SVG-OM along with LIMA-LAD patent;  LV NORMAL Fxn  . CAROTID ENDARTERECTOMY Left Oct. 29, 2007   Dr. Oneida Alar:  . CATARACT EXTRACTION W/PHACO Right 03/05/2017   Procedure: CATARACT EXTRACTION PHACO AND INTRAOCULAR LENS PLACEMENT (IOC);  Surgeon: Leandrew Koyanagi, MD;  Location: [facility] ORS;  Service: Ophthalmology;  Laterality: Right;  [place] 00:58.5 AP% 10.6 CDE 6.20 Fluid Pack lot # [id number]  . CATARACT EXTRACTION W/PHACO Left 04/15/2017   Procedure: CATARACT EXTRACTION PHACO AND INTRAOCULAR LENS PLACEMENT ([place])  Left;  Surgeon: Leandrew Koyanagi, MD;  Location: [facility];  Service: Ophthalmology;  Laterality: Left;  . COLONOSCOPY WITH PROPOFOL N/A 09/15/2017   Procedure: COLONOSCOPY WITH PROPOFOL;  Surgeon: Lucilla Lame, MD;  Location: [facility] ENDOSCOPY;  Service: Endoscopy;  Laterality: N/A;  . COLONOSCOPY WITH PROPOFOL N/A 10/26/2018   Procedure: COLONOSCOPY WITH PROPOFOL;  Surgeon: Lucilla Lame, MD;  Location: [facility] ENDOSCOPY;   Service: Endoscopy;  Laterality: N/A;  . CORONARY ARTERY BYPASS GRAFT  1998   LIMA-LAD, SVG-OM, SVG-RPDA, SVG-DIAG  . CPET / MET  12/2012   Mild chronotropic incompetence - read 82% of predicted; also reduced effort; peak VO2 15.7 / 75% (did not reach Max effort) -- suggested ischemic response in last 1.5 minutes of exercise. Normal pulmonary function on PFTs but poor response to  . DOPPLER ECHOCARDIOGRAPHY  11/20/2010   EF =>55%; LV norm mild aortic scelorosis  . ENDOVASCULAR REPAIR/STENT GRAFT N/A 05/18/2019   Procedure: ENDOVASCULAR REPAIR/STENT GRAFT;  Surgeon: Algernon Huxley, MD;  Location: [facility] CV LAB;  Service: Cardiovascular;  Laterality: N/A;  . ESOPHAGOGASTRODUODENOSCOPY (EGD) WITH PROPOFOL N/A 09/15/2017   Procedure: ESOPHAGOGASTRODUODENOSCOPY (EGD) WITH PROPOFOL;  Surgeon: Lucilla Lame, MD;  Location: [facility] ENDOSCOPY;  Service: Endoscopy;  Laterality: N/A;  . FRACTURE SURGERY Right 03/19/2015   wrist  . HIATAL HERNIA REPAIR    . ILIAC ARTERY STENT  12/15/2005   PTA and direct stenting rgt and lft common iliac arteries  . KYPHOPLASTY N/A 04/05/2019   Procedure: KYPHOPLASTY T12, L3, L4 L5;  Surgeon:  Hessie Knows, MD;  Location: [facility] ORS;  Service: Orthopedics;  Laterality: N/A;  . NM GATED MYOVIEW ([place] [facility])  12/2017   Low Risk - no ischemia or infarct.  EF > 65%. no RWMA  . NM MYOVIEW ([place] [facility])  11/11/2010; February 2016   a) TM STRESS----NORMAL PERFUSION ,EF 68%; b) EF 50-55%. Normal LV function. No significant ischemia or infarction.  . OPEN REDUCTION INTERNAL FIXATION (ORIF) DISTAL RADIAL FRACTURE Left 01/13/2019   Procedure: OPEN REDUCTION INTERNAL FIXATION (ORIF) DISTAL  RADIAL FRACTURE - LEFT - SLEEP APNEA;  Surgeon: Hessie Knows, MD;  Location: [facility] ORS;  Service: Orthopedics;  Laterality: Left;  . ORIF ELBOW FRACTURE Right 01/01/2017   Procedure: OPEN REDUCTION INTERNAL FIXATION (ORIF) ELBOW/OLECRANON FRACTURE;  Surgeon: Hessie Knows, MD;  Location: [facility] ORS;  Service:  Orthopedics;  Laterality: Right;  . ORIF WRIST FRACTURE Right 03/19/2015   Procedure: OPEN REDUCTION INTERNAL FIXATION (ORIF) WRIST FRACTURE;  Surgeon: Hessie Knows, MD;  Location: [facility] ORS;  Service: Orthopedics;  Laterality: Right;  . THORACIC AORTA - CAROTID ANGIOGRAM  October 2007   Dr. 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 TRANSTHORACIC ECHOCARDIOGRAM  February 2016   [facility]: Normal LV function. Dilated left atrium.    Allergies Clonazepam, Ropinirole, Codeine, Niacin and related, Tramadol, Hydrocodone, and Oxycodone  Social History Social History   Tobacco Use  . Smoking status: Former Smoker    Types: Cigarettes    Quit date: 08/04/1997    Years since quitting: 22.4  . Smokeless tobacco: Never Used  Substance Use Topics  . Alcohol use: No  . Drug use: No    Review of Systems Constitutional: Negative for fever. Cardiovascular: Negative for chest pain. Respiratory: Negative for shortness of breath. Gastrointestinal: Positive for abdominal pain, vomiting Musculoskeletal: Negative for back pain. Skin: Negative for rash. Neurological: Negative for headaches, focal weakness or numbness.  All systems negative/normal/unremarkable except as stated in the HPI  ____________________________________________   PHYSICAL EXAM:  VITAL SIGNS: ED Triage Vitals [12/25/19 1406]  Enc Vitals Group     BP 131/71     Pulse Rate 78     Resp 18     Temp 99.6 F (37.6 C)     Temp Source Oral     SpO2 95 %     Weight 196 lb 3.4 oz (89 kg)     Height '5\' 10"'  (1.778 m)     Head Circumference      Peak Flow      Pain Score 4     Pain Loc      Pain Edu?      Excl. in [place]?     Constitutional: Alert and oriented. Well appearing and in no distress. Eyes: Conjunctivae are normal. Normal extraocular movements. Cardiovascular: Normal rate, regular rhythm. No murmurs, rubs, or gallops. Respiratory:  Normal respiratory effort without tachypnea nor retractions. Breath sounds are clear and equal bilaterally. No wheezes/rales/rhonchi. Gastrointestinal: Soft and nontender. Normal bowel sounds Musculoskeletal: Nontender with normal range of motion in extremities. No lower extremity tenderness nor edema. Neurologic:  Normal speech and language. No gross focal neurologic deficits are appreciated.  Skin:  Skin is warm, dry and intact. No rash noted. Psychiatric: Mood and affect are normal. Speech and behavior are normal.  ____________________________________________  EKG: Interpreted by me.  Sinus rhythm with sinus arrhythmia, right bundle branch block, normal axis, normal QT  ____________________________________________  ED COURSE:  As part  of my medical decision making, I reviewed the following data within the [place] History obtained from family if available, nursing notes, old chart and ekg, as well as notes from prior ED visits. Patient presented for left lower quadrant abdominal pain, we will assess with labs and imaging as indicated at this time.   Procedures  [name] was evaluated in Emergency Department on 12/25/2019 for the symptoms described in the history of present illness. He was evaluated in the context of the global COVID-19 pandemic, which necessitated consideration that the patient might be at risk for infection with the SARS-CoV-2 virus that causes COVID-19. Institutional protocols and algorithms that pertain to the evaluation of patients at risk for COVID-19 are in a state of rapid change based on information released by regulatory bodies including the CDC and federal and state organizations. These policies and algorithms were followed during the patient's care in the ED.  ____________________________________________   LABS (pertinent positives/negatives)  Labs Reviewed  COMPREHENSIVE METABOLIC PANEL - Abnormal; Notable for the following  components:      Result Value   Glucose, Bld 120 (*)    Total Bilirubin 1.9 (*)    GFR calc non Af Amer 57 (*)    All other components within normal limits  CBC - Abnormal; Notable for the following components:   WBC 14.0 (*)    Platelets 141 (*)    All other components within normal limits  URINALYSIS, COMPLETE (UACMP) WITH MICROSCOPIC - Abnormal; Notable for the following components:   Hgb urine dipstick SMALL (*)    Protein, ur 30 (*)    All other components within normal limits  LIPASE, BLOOD    RADIOLOGY Images were viewed by me  CT the abdomen pelvis with contrast IMPRESSION:  1. 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   2. Extensive fat stranding about the left kidney.   3. Additional tiny nonobstructive calculus of the nonobstructed  superior pole of the left kidney.   4. Status post interval aortobiiliac stent endograft repair of an  infrarenal abdominal aortic aneurysm. Aortic Atherosclerosis  (ICD10-I70.0).  ____________________________________________   DIFFERENTIAL DIAGNOSIS   Renal colic, UTI, pyelonephritis, diverticulitis, diverticulosis, obstruction  FINAL ASSESSMENT AND PLAN  Renal colic   Plan: The patient had presented for left lower quadrant pain. Patient's labs did reveal a white count of 14,000. Patient's imaging did reveal large proximal left ureteral stone.  Pain is under control remarkably with only Tylenol.  I have discussed with urology and they will see him tomorrow in follow-up.   Laurence Aly, MD    Note: This note was generated in part or whole with voice recognition software. Voice recognition is usually quite accurate but there are transcription errors that can and very often do occur. I apologize for any typographical errors that were not detected and corrected.      Earleen Newport, MD 12/25/19 1910

## 2019-12-26 ENCOUNTER — Encounter
Admission: RE | Disposition: A | Discharge status: Home / Self Care | Payer: Self-pay | Source: Ambulatory Visit | Attending: Internal Medicine

## 2019-12-26 ENCOUNTER — Encounter: Payer: Self-pay | Admitting: Cardiology

## 2019-12-26 ENCOUNTER — Other Ambulatory Visit: Payer: Self-pay

## 2019-12-26 ENCOUNTER — Ambulatory Visit
Admission: RE | Admit: 2019-12-26 | Discharge: 2019-12-26 | Disposition: A | Discharge status: Home / Self Care | Payer: Medicare Other | Source: Ambulatory Visit | Attending: Urology | Admitting: Urology

## 2019-12-26 ENCOUNTER — Inpatient Hospital Stay: Admit: 2019-12-26 | Payer: 59 | Admitting: Internal Medicine

## 2019-12-26 ENCOUNTER — Telehealth: Payer: Self-pay | Admitting: Urology

## 2019-12-26 ENCOUNTER — Ambulatory Visit: Payer: Medicare Other | Admitting: Urology

## 2019-12-26 ENCOUNTER — Inpatient Hospital Stay: Payer: Medicare Other | Admitting: Certified Registered"

## 2019-12-26 ENCOUNTER — Ambulatory Visit
Admission: RE | Admit: 2019-12-26 | Discharge: 2019-12-26 | Disposition: A | Discharge status: Home / Self Care | Payer: Medicare Other | Attending: Urology | Admitting: Urology

## 2019-12-26 ENCOUNTER — Ambulatory Visit (INDEPENDENT_AMBULATORY_CARE_PROVIDER_SITE_OTHER): Payer: Medicare Other | Admitting: Urology

## 2019-12-26 ENCOUNTER — Other Ambulatory Visit: Payer: Self-pay | Admitting: Radiology

## 2019-12-26 ENCOUNTER — Other Ambulatory Visit: Payer: Self-pay | Admitting: Family Medicine

## 2019-12-26 ENCOUNTER — Inpatient Hospital Stay: Payer: Medicare Other

## 2019-12-26 ENCOUNTER — Inpatient Hospital Stay
Admission: RE | Admit: 2019-12-26 | Discharge: 2019-12-29 | DRG: 660 | Disposition: A | Discharge status: Home Health | Payer: Medicare Other | Source: Other Acute Inpatient Hospital | Attending: Internal Medicine | Admitting: Internal Medicine

## 2019-12-26 ENCOUNTER — Other Ambulatory Visit
Admission: RE | Admit: 2019-12-26 | Discharge: 2019-12-26 | Disposition: A | Discharge status: Home / Self Care | Payer: Medicare Other | Source: Ambulatory Visit | Attending: Urology | Admitting: Urology

## 2019-12-26 ENCOUNTER — Encounter: Payer: Self-pay | Admitting: Urology

## 2019-12-26 VITALS — BP 121/70 | HR 79 | Temp 99.2°F | Ht 71.0 in | Wt 196.0 lb

## 2019-12-26 DIAGNOSIS — M810 Age-related osteoporosis without current pathological fracture: Secondary | ICD-10-CM | POA: Diagnosis present

## 2019-12-26 DIAGNOSIS — I739 Peripheral vascular disease, unspecified: Secondary | ICD-10-CM | POA: Diagnosis present

## 2019-12-26 DIAGNOSIS — D6832 Hemorrhagic disorder due to extrinsic circulating anticoagulants: Secondary | ICD-10-CM | POA: Diagnosis not present

## 2019-12-26 DIAGNOSIS — I7409 Other arterial embolism and thrombosis of abdominal aorta: Secondary | ICD-10-CM | POA: Diagnosis not present

## 2019-12-26 DIAGNOSIS — Z95820 Peripheral vascular angioplasty status with implants and grafts: Secondary | ICD-10-CM

## 2019-12-26 DIAGNOSIS — Z87891 Personal history of nicotine dependence: Secondary | ICD-10-CM

## 2019-12-26 DIAGNOSIS — F0390 Unspecified dementia without behavioral disturbance: Secondary | ICD-10-CM | POA: Diagnosis present

## 2019-12-26 DIAGNOSIS — N201 Calculus of ureter: Secondary | ICD-10-CM | POA: Diagnosis not present

## 2019-12-26 DIAGNOSIS — Z885 Allergy status to narcotic agent status: Secondary | ICD-10-CM

## 2019-12-26 DIAGNOSIS — R319 Hematuria, unspecified: Secondary | ICD-10-CM

## 2019-12-26 DIAGNOSIS — K219 Gastro-esophageal reflux disease without esophagitis: Secondary | ICD-10-CM | POA: Diagnosis present

## 2019-12-26 DIAGNOSIS — Z86010 Personal history of colonic polyps: Secondary | ICD-10-CM

## 2019-12-26 DIAGNOSIS — I480 Paroxysmal atrial fibrillation: Secondary | ICD-10-CM | POA: Diagnosis present

## 2019-12-26 DIAGNOSIS — Z8546 Personal history of malignant neoplasm of prostate: Secondary | ICD-10-CM | POA: Diagnosis not present

## 2019-12-26 DIAGNOSIS — I129 Hypertensive chronic kidney disease with stage 1 through stage 4 chronic kidney disease, or unspecified chronic kidney disease: Secondary | ICD-10-CM | POA: Diagnosis present

## 2019-12-26 DIAGNOSIS — N200 Calculus of kidney: Secondary | ICD-10-CM

## 2019-12-26 DIAGNOSIS — G4733 Obstructive sleep apnea (adult) (pediatric): Secondary | ICD-10-CM | POA: Diagnosis present

## 2019-12-26 DIAGNOSIS — E785 Hyperlipidemia, unspecified: Secondary | ICD-10-CM | POA: Diagnosis present

## 2019-12-26 DIAGNOSIS — N189 Chronic kidney disease, unspecified: Secondary | ICD-10-CM | POA: Diagnosis not present

## 2019-12-26 DIAGNOSIS — I2510 Atherosclerotic heart disease of native coronary artery without angina pectoris: Secondary | ICD-10-CM | POA: Diagnosis present

## 2019-12-26 DIAGNOSIS — Z6827 Body mass index (BMI) 27.0-27.9, adult: Secondary | ICD-10-CM

## 2019-12-26 DIAGNOSIS — M1990 Unspecified osteoarthritis, unspecified site: Secondary | ICD-10-CM | POA: Diagnosis present

## 2019-12-26 DIAGNOSIS — N4889 Other specified disorders of penis: Secondary | ICD-10-CM | POA: Diagnosis not present

## 2019-12-26 DIAGNOSIS — I255 Ischemic cardiomyopathy: Secondary | ICD-10-CM | POA: Diagnosis present

## 2019-12-26 DIAGNOSIS — J432 Centrilobular emphysema: Secondary | ICD-10-CM | POA: Diagnosis present

## 2019-12-26 DIAGNOSIS — A4150 Gram-negative sepsis, unspecified: Secondary | ICD-10-CM | POA: Diagnosis not present

## 2019-12-26 DIAGNOSIS — N179 Acute kidney failure, unspecified: Secondary | ICD-10-CM | POA: Diagnosis present

## 2019-12-26 DIAGNOSIS — I10 Essential (primary) hypertension: Secondary | ICD-10-CM | POA: Diagnosis present

## 2019-12-26 DIAGNOSIS — E039 Hypothyroidism, unspecified: Secondary | ICD-10-CM | POA: Diagnosis present

## 2019-12-26 DIAGNOSIS — N136 Pyonephrosis: Principal | ICD-10-CM | POA: Diagnosis present

## 2019-12-26 DIAGNOSIS — Z8679 Personal history of other diseases of the circulatory system: Secondary | ICD-10-CM

## 2019-12-26 DIAGNOSIS — R410 Disorientation, unspecified: Secondary | ICD-10-CM

## 2019-12-26 DIAGNOSIS — E663 Overweight: Secondary | ICD-10-CM | POA: Diagnosis present

## 2019-12-26 DIAGNOSIS — Z87442 Personal history of urinary calculi: Secondary | ICD-10-CM | POA: Diagnosis not present

## 2019-12-26 DIAGNOSIS — F05 Delirium due to known physiological condition: Secondary | ICD-10-CM | POA: Diagnosis present

## 2019-12-26 DIAGNOSIS — D631 Anemia in chronic kidney disease: Secondary | ICD-10-CM | POA: Diagnosis present

## 2019-12-26 DIAGNOSIS — Z951 Presence of aortocoronary bypass graft: Secondary | ICD-10-CM | POA: Diagnosis present

## 2019-12-26 DIAGNOSIS — Z961 Presence of intraocular lens: Secondary | ICD-10-CM

## 2019-12-26 DIAGNOSIS — Z9842 Cataract extraction status, left eye: Secondary | ICD-10-CM

## 2019-12-26 DIAGNOSIS — N1330 Unspecified hydronephrosis: Secondary | ICD-10-CM | POA: Diagnosis not present

## 2019-12-26 DIAGNOSIS — Z79899 Other long term (current) drug therapy: Secondary | ICD-10-CM

## 2019-12-26 DIAGNOSIS — R531 Weakness: Secondary | ICD-10-CM

## 2019-12-26 DIAGNOSIS — Z20822 Contact with and (suspected) exposure to covid-19: Secondary | ICD-10-CM | POA: Diagnosis present

## 2019-12-26 DIAGNOSIS — H9190 Unspecified hearing loss, unspecified ear: Secondary | ICD-10-CM | POA: Diagnosis present

## 2019-12-26 DIAGNOSIS — N390 Urinary tract infection, site not specified: Secondary | ICD-10-CM

## 2019-12-26 DIAGNOSIS — Z85828 Personal history of other malignant neoplasm of skin: Secondary | ICD-10-CM

## 2019-12-26 DIAGNOSIS — Z7901 Long term (current) use of anticoagulants: Secondary | ICD-10-CM | POA: Diagnosis not present

## 2019-12-26 DIAGNOSIS — N3289 Other specified disorders of bladder: Secondary | ICD-10-CM | POA: Diagnosis present

## 2019-12-26 DIAGNOSIS — N1831 Chronic kidney disease, stage 3a: Secondary | ICD-10-CM | POA: Diagnosis present

## 2019-12-26 DIAGNOSIS — Z85038 Personal history of other malignant neoplasm of large intestine: Secondary | ICD-10-CM

## 2019-12-26 DIAGNOSIS — Z888 Allergy status to other drugs, medicaments and biological substances status: Secondary | ICD-10-CM

## 2019-12-26 DIAGNOSIS — Z9841 Cataract extraction status, right eye: Secondary | ICD-10-CM

## 2019-12-26 DIAGNOSIS — I779 Disorder of arteries and arterioles, unspecified: Secondary | ICD-10-CM | POA: Diagnosis present

## 2019-12-26 DIAGNOSIS — I4510 Unspecified right bundle-branch block: Secondary | ICD-10-CM | POA: Diagnosis present

## 2019-12-26 HISTORY — PX: CYSTOSCOPY WITH STENT PLACEMENT: SHX5790

## 2019-12-26 LAB — BASIC METABOLIC PANEL
Anion gap: 7 (ref 5–15)
BUN: 21 mg/dL (ref 8–23)
CO2: 24 mmol/L (ref 22–32)
Calcium: 8.6 mg/dL — ABNORMAL LOW (ref 8.9–10.3)
Chloride: 105 mmol/L (ref 98–111)
Creatinine, Ser: 1.33 mg/dL — ABNORMAL HIGH (ref 0.61–1.24)
GFR calc Af Amer: 56 mL/min — ABNORMAL LOW (ref >60)
GFR calc non Af Amer: 49 mL/min — ABNORMAL LOW (ref >60)
Glucose, Bld: 137 mg/dL — ABNORMAL HIGH (ref 70–99)
Potassium: 4.1 mmol/L (ref 3.5–5.1)
Sodium: 136 mmol/L (ref 135–145)

## 2019-12-26 LAB — CBC WITH DIFFERENTIAL/PLATELET
Abs Immature Granulocytes: 0.16 10*3/uL — ABNORMAL HIGH (ref 0.00–0.07)
Basophils Absolute: 0 10*3/uL (ref 0.0–0.1)
Basophils Relative: 0 %
Eosinophils Absolute: 0 10*3/uL (ref 0.0–0.5)
Eosinophils Relative: 0 %
HCT: 38.9 % — ABNORMAL LOW (ref 39.0–52.0)
Hemoglobin: 12.9 g/dL — ABNORMAL LOW (ref 13.0–17.0)
Immature Granulocytes: 1 %
Lymphocytes Relative: 4 %
Lymphs Abs: 0.6 10*3/uL — ABNORMAL LOW (ref 0.7–4.0)
MCH: 30.8 pg (ref 26.0–34.0)
MCHC: 33.2 g/dL (ref 30.0–36.0)
MCV: 92.8 fL (ref 80.0–100.0)
Monocytes Absolute: 0.7 10*3/uL (ref 0.1–1.0)
Monocytes Relative: 4 %
Neutro Abs: 14.3 10*3/uL — ABNORMAL HIGH (ref 1.7–7.7)
Neutrophils Relative %: 91 %
Platelets: 123 10*3/uL — ABNORMAL LOW (ref 150–400)
RBC: 4.19 MIL/uL — ABNORMAL LOW (ref 4.22–5.81)
RDW: 14.8 % (ref 11.5–15.5)
WBC: 15.8 10*3/uL — ABNORMAL HIGH (ref 4.0–10.5)
nRBC: 0 % (ref 0.0–0.2)

## 2019-12-26 LAB — RESPIRATORY PANEL BY RT PCR (FLU A&B, COVID)
Influenza A by PCR: NEGATIVE
Influenza B by PCR: NEGATIVE
SARS Coronavirus 2 by RT PCR: NEGATIVE

## 2019-12-26 SURGERY — CYSTOSCOPY, WITH STENT INSERTION
Anesthesia: General | Laterality: Left

## 2019-12-26 MED ORDER — HYDRALAZINE HCL 10 MG PO TABS
10.0000 mg | ORAL_TABLET | Freq: Four times a day (QID) | ORAL | Status: DC | PRN
  Filled 2019-12-26: qty 1

## 2019-12-26 MED ORDER — APIXABAN 5 MG PO TABS
5.0000 mg | ORAL_TABLET | Freq: Two times a day (BID) | ORAL | Status: DC
  Administered 2019-12-27 – 2019-12-29 (×5): 5 mg via ORAL
  Filled 2019-12-26 (×5): qty 1

## 2019-12-26 MED ORDER — POLYETHYLENE GLYCOL 3350 17 G PO PACK
17.0000 g | PACK | Freq: Two times a day (BID) | ORAL | Status: AC
  Administered 2019-12-26 – 2019-12-28 (×4): 17 g via ORAL
  Filled 2019-12-26 (×4): qty 1

## 2019-12-26 MED ORDER — MAGNESIUM OXIDE 400 (241.3 MG) MG PO TABS
400.0000 mg | ORAL_TABLET | Freq: Every evening | ORAL | Status: DC
  Administered 2019-12-26 – 2019-12-28 (×3): 400 mg via ORAL
  Filled 2019-12-26 (×3): qty 1

## 2019-12-26 MED ORDER — PROPOFOL 10 MG/ML IV BOLUS
INTRAVENOUS | Status: DC | PRN
  Administered 2019-12-26: 80 mg via INTRAVENOUS

## 2019-12-26 MED ORDER — PHENYLEPHRINE HCL (PRESSORS) 10 MG/ML IV SOLN
INTRAVENOUS | Status: DC | PRN
  Administered 2019-12-26 (×4): 100 ug via INTRAVENOUS

## 2019-12-26 MED ORDER — ROCURONIUM BROMIDE 100 MG/10ML IV SOLN
INTRAVENOUS | Status: DC | PRN
  Administered 2019-12-26: 20 mg via INTRAVENOUS
  Administered 2019-12-26: 10 mg via INTRAVENOUS

## 2019-12-26 MED ORDER — LIDOCAINE HCL (CARDIAC) PF 100 MG/5ML IV SOSY
PREFILLED_SYRINGE | INTRAVENOUS | Status: DC | PRN
  Administered 2019-12-26: 100 mg via INTRAVENOUS

## 2019-12-26 MED ORDER — FINASTERIDE 5 MG PO TABS
5.0000 mg | ORAL_TABLET | Freq: Every day | ORAL | Status: DC
  Administered 2019-12-27 – 2019-12-29 (×3): 5 mg via ORAL
  Filled 2019-12-26 (×3): qty 1

## 2019-12-26 MED ORDER — SUCCINYLCHOLINE CHLORIDE 20 MG/ML IJ SOLN
INTRAMUSCULAR | Status: DC | PRN
  Administered 2019-12-26: 100 mg via INTRAVENOUS

## 2019-12-26 MED ORDER — METOPROLOL TARTRATE 25 MG PO TABS
25.0000 mg | ORAL_TABLET | Freq: Two times a day (BID) | ORAL | Status: DC
  Administered 2019-12-26 – 2019-12-29 (×6): 25 mg via ORAL
  Filled 2019-12-26 (×6): qty 1

## 2019-12-26 MED ORDER — ACETAMINOPHEN 325 MG PO TABS: 650.0000 mg | ORAL_TABLET | Freq: Four times a day (QID) | ORAL | Status: DC | PRN

## 2019-12-26 MED ORDER — ONDANSETRON HCL 4 MG/2ML IJ SOLN: 4.0000 mg | Freq: Once | INTRAMUSCULAR | Status: DC | PRN

## 2019-12-26 MED ORDER — PANTOPRAZOLE SODIUM 40 MG PO TBEC
40.0000 mg | DELAYED_RELEASE_TABLET | Freq: Every day | ORAL | Status: DC
  Administered 2019-12-27 – 2019-12-29 (×3): 40 mg via ORAL
  Filled 2019-12-26 (×3): qty 1

## 2019-12-26 MED ORDER — DEXAMETHASONE SODIUM PHOSPHATE 10 MG/ML IJ SOLN
INTRAMUSCULAR | Status: DC | PRN
  Administered 2019-12-26: 10 mg via INTRAVENOUS

## 2019-12-26 MED ORDER — SODIUM CHLORIDE 0.9 % IV SOLN: 250.0000 mL | INTRAVENOUS | Status: DC | PRN

## 2019-12-26 MED ORDER — ALBUTEROL SULFATE (2.5 MG/3ML) 0.083% IN NEBU: 2.5000 mg | INHALATION_SOLUTION | RESPIRATORY_TRACT | Status: DC | PRN

## 2019-12-26 MED ORDER — SODIUM CHLORIDE 0.9 % IV SOLN
2.0000 g | INTRAVENOUS | Status: DC
  Administered 2019-12-27 – 2019-12-28 (×2): 2 g via INTRAVENOUS
  Filled 2019-12-26 (×2): qty 2

## 2019-12-26 MED ORDER — NITROGLYCERIN 0.4 MG SL SUBL: 0.4000 mg | SUBLINGUAL_TABLET | SUBLINGUAL | Status: DC | PRN

## 2019-12-26 MED ORDER — ACETAMINOPHEN 10 MG/ML IV SOLN
INTRAVENOUS | Status: DC | PRN
  Administered 2019-12-26: 1000 mg via INTRAVENOUS

## 2019-12-26 MED ORDER — MIRABEGRON ER 50 MG PO TB24
50.0000 mg | ORAL_TABLET | Freq: Every day | ORAL | Status: DC
  Administered 2019-12-26 – 2019-12-29 (×4): 50 mg via ORAL
  Filled 2019-12-26 (×5): qty 1

## 2019-12-26 MED ORDER — ACETAMINOPHEN 10 MG/ML IV SOLN
INTRAVENOUS | Status: AC
  Filled 2019-12-26: qty 100

## 2019-12-26 MED ORDER — SUGAMMADEX SODIUM 200 MG/2ML IV SOLN
INTRAVENOUS | Status: DC | PRN
  Administered 2019-12-26: 200 mg via INTRAVENOUS

## 2019-12-26 MED ORDER — LACTATED RINGERS IV SOLN: INTRAVENOUS | Status: DC

## 2019-12-26 MED ORDER — SODIUM CHLORIDE 0.9% FLUSH: 3.0000 mL | INTRAVENOUS | Status: DC | PRN

## 2019-12-26 MED ORDER — SODIUM CHLORIDE 0.9% FLUSH
3.0000 mL | Freq: Two times a day (BID) | INTRAVENOUS | Status: DC
  Administered 2019-12-26: 3 mL via INTRAVENOUS

## 2019-12-26 MED ORDER — TAMSULOSIN HCL 0.4 MG PO CAPS
0.4000 mg | ORAL_CAPSULE | Freq: Every evening | ORAL | Status: DC
  Administered 2019-12-26 – 2019-12-28 (×3): 0.4 mg via ORAL
  Filled 2019-12-26 (×4): qty 1

## 2019-12-26 MED ORDER — FENTANYL CITRATE (PF) 100 MCG/2ML IJ SOLN
25.0000 ug | INTRAMUSCULAR | Status: DC | PRN
  Administered 2019-12-26: 25 ug via INTRAVENOUS

## 2019-12-26 MED ORDER — ROSUVASTATIN CALCIUM 10 MG PO TABS
20.0000 mg | ORAL_TABLET | Freq: Every day | ORAL | Status: DC
  Administered 2019-12-26 – 2019-12-29 (×4): 20 mg via ORAL
  Filled 2019-12-26 (×4): qty 2

## 2019-12-26 MED ORDER — ONDANSETRON HCL 4 MG PO TABS: 4.0000 mg | ORAL_TABLET | Freq: Four times a day (QID) | ORAL | Status: DC | PRN

## 2019-12-26 MED ORDER — EZETIMIBE 10 MG PO TABS
10.0000 mg | ORAL_TABLET | Freq: Every day | ORAL | Status: DC
  Administered 2019-12-26 – 2019-12-29 (×4): 10 mg via ORAL
  Filled 2019-12-26 (×5): qty 1

## 2019-12-26 MED ORDER — ONDANSETRON HCL 4 MG/2ML IJ SOLN: 4.0000 mg | Freq: Four times a day (QID) | INTRAMUSCULAR | Status: DC | PRN

## 2019-12-26 MED ORDER — VITAMIN B-12 1000 MCG PO TABS
500.0000 ug | ORAL_TABLET | Freq: Every day | ORAL | Status: DC
  Administered 2019-12-27 – 2019-12-29 (×3): 500 ug via ORAL
  Filled 2019-12-26 (×3): qty 1

## 2019-12-26 MED ORDER — ACETAMINOPHEN 650 MG RE SUPP: 650.0000 mg | Freq: Four times a day (QID) | RECTAL | Status: DC | PRN

## 2019-12-26 MED ORDER — LIDOCAINE HCL URETHRAL/MUCOSAL 2 % EX GEL
CUTANEOUS | Status: AC
  Filled 2019-12-26: qty 10

## 2019-12-26 MED ORDER — FENTANYL CITRATE (PF) 100 MCG/2ML IJ SOLN
INTRAMUSCULAR | Status: AC
  Filled 2019-12-26: qty 2

## 2019-12-26 MED ORDER — SODIUM CHLORIDE 0.9 % IV SOLN
1.0000 g | INTRAVENOUS | Status: AC
  Administered 2019-12-26: 1 g via INTRAVENOUS
  Filled 2019-12-26: qty 1

## 2019-12-26 MED ORDER — ONDANSETRON HCL 4 MG/2ML IJ SOLN
INTRAMUSCULAR | Status: DC | PRN
  Administered 2019-12-26: 4 mg via INTRAVENOUS

## 2019-12-26 MED ORDER — DONEPEZIL HCL 5 MG PO TABS
10.0000 mg | ORAL_TABLET | Freq: Every day | ORAL | Status: DC
  Administered 2019-12-26 – 2019-12-28 (×3): 10 mg via ORAL
  Filled 2019-12-26 (×4): qty 2

## 2019-12-26 MED ORDER — UMECLIDINIUM-VILANTEROL 62.5-25 MCG/INH IN AEPB
1.0000 | INHALATION_SPRAY | Freq: Every day | RESPIRATORY_TRACT | Status: DC
  Administered 2019-12-27 – 2019-12-29 (×3): 1 via RESPIRATORY_TRACT
  Filled 2019-12-26: qty 14

## 2019-12-26 MED ORDER — EPHEDRINE SULFATE 50 MG/ML IJ SOLN
INTRAMUSCULAR | Status: DC | PRN
  Administered 2019-12-26: 10 mg via INTRAVENOUS

## 2019-12-26 SURGICAL SUPPLY — 29 items
BAG DRAIN CYSTO-URO LG1000N (MISCELLANEOUS) ×2 IMPLANT
BAG DRN RND TRDRP ANRFLXCHMBR (UROLOGICAL SUPPLIES) ×1
BAG URINE DRAIN 2000ML AR STRL (UROLOGICAL SUPPLIES) ×1 IMPLANT
BRUSH SCRUB EZ  4% CHG (MISCELLANEOUS)
BRUSH SCRUB EZ 4% CHG (MISCELLANEOUS) IMPLANT
CATH COUDE FOLEY 2W 5CC 18FR (CATHETERS) ×1 IMPLANT
CATH FOL 2WAY LX 18X30 (CATHETERS) IMPLANT
CATH URETL 5X70 OPEN END (CATHETERS) ×2 IMPLANT
CONRAY 43 FOR UROLOGY 50M (MISCELLANEOUS) ×2 IMPLANT
GLOVE BIOGEL PI IND STRL 7.5 (GLOVE) ×1 IMPLANT
GLOVE BIOGEL PI INDICATOR 7.5 (GLOVE) ×1
GOWN STRL REUS W/ TWL LRG LVL3 (GOWN DISPOSABLE) ×1 IMPLANT
GOWN STRL REUS W/ TWL XL LVL3 (GOWN DISPOSABLE) ×1 IMPLANT
GOWN STRL REUS W/TWL LRG LVL3 (GOWN DISPOSABLE) ×2
GOWN STRL REUS W/TWL XL LVL3 (GOWN DISPOSABLE) ×2
GUIDEWIRE GREEN .038 145CM (MISCELLANEOUS) ×1 IMPLANT
GUIDEWIRE STR DUAL SENSOR (WIRE) ×2 IMPLANT
KIT TURNOVER CYSTO (KITS) ×2 IMPLANT
PACK CYSTO AR (MISCELLANEOUS) ×2 IMPLANT
SET CYSTO W/LG BORE CLAMP LF (SET/KITS/TRAYS/PACK) ×2 IMPLANT
SOL .9 NS 3000ML IRR  AL (IV SOLUTION) ×2
SOL .9 NS 3000ML IRR AL (IV SOLUTION) ×1
SOL .9 NS 3000ML IRR UROMATIC (IV SOLUTION) ×1 IMPLANT
STENT URET 6FRX24 CONTOUR (STENTS) IMPLANT
STENT URET 6FRX26 CONTOUR (STENTS) IMPLANT
STENT URET 6FRX28 CONTOUR (STENTS) ×1 IMPLANT
SURGILUBE 2OZ TUBE FLIPTOP (MISCELLANEOUS) ×2 IMPLANT
SYRINGE IRR TOOMEY STRL 70CC (SYRINGE) IMPLANT
WATER STERILE IRR 1000ML POUR (IV SOLUTION) ×2 IMPLANT

## 2019-12-26 NOTE — H&P (Signed)
History and Physical  [name] QPY:[id number] DOB: [birth year] DOA: 12/26/2019  PCP: [name], MD Patient coming from: Home  I have personally briefly reviewed patient's old medical records in [place]   Chief Complaint: Left flank pain  HPI: [name] is a 86 y.o. male past medical history of CAD status post CABG times 12/18/1997, ischemic cardiomyopathy paroxysmal atrial fibrillation on Eliquis CAD status post left endarterectomy emphysema and aortic iliac occlusive disease status post stenting 2007 status post EVAR in September 2020 at [facility] the urologist office for left flank pain after being seen in the ER the day prior to admission with nausea and vomiting was found to have an obstructive stone of 1 cm on CT with hydronephrosis was taken to the OR by urologist and he is status post cystoscopy with stent placement. And we were consult for admission and treatment   Review of Systems: All systems reviewed and apart from history of presenting illness, are negative.  Past Medical History:  Diagnosis Date  . AAA (abdominal aortic aneurysm) ([place]) 05/2019   Relativley Rapid progression from June-Aug 2020 (4.6 - 5.4 cm): s/p EVAR (Dr. Lucky Cowboy [facility]): EVAR - 23 mm prox, 12 cm distal & x 18 cm length Gore Excluder Endoprosthesis Main body (via RFA distal to lowest Renal A); 66m x 14 cm L Contralateral Limb for L Iliac - extended with 8 mm x 6cm LifeStar stent (post-dilated with 7 mm DEB).  Additional R Iliac PTA not required.   . Arthritis   . Basal cell carcinoma of eyelid 01/03/2013   2019 - R side of Nose  . Benign neoplasm of ascending colon   . Benign neoplasm of descending colon   . Bilateral iliac artery stenosis ([place] 2007   Bilateral Iliac A stenting; extension of EVAR limbs into both Iliacs -- additional stent placed in L Iliac.  . Cancer ([place]    Skin cancer  . Colon polyps   . Compression fracture of fourth lumbar vertebra ([place] 04/2019   - s/p  Kyphoplasty; T12, L3-5 ([facility]  . COPD (chronic obstructive pulmonary disease) (HCC)    Centrilobular Emphysema  . Coronary artery disease involving native coronary artery without angina pectoris 1998   - s/p CABG, occluded SVG-D1; patent LIMA-LAD, SVG-OM, SVG- RCA  . Dementia ([place]    memory loss - started noting late 2019  . Falls frequently   . GERD (gastroesophageal reflux disease)   . Hemorrhoid 02/10/2015  . History of hiatal hernia   . History of kidney stones   . HOH (hard of hearing)   . Hypertension   . Osteoporosis   . PAF (paroxysmal atrial fibrillation) ([place]    on Eliquis for OAC - Rate Control   . Prostate cancer ([place]   . Prostate disorder   . Sciatic pain    Chronic  . Sleep apnea    cpap  . Temporary cerebral vascular dysfunction 02/10/2015   Had negative work-up.  July, 2012.  No medication changes, had forgot them that day, got dehydrated.   . Urinary incontinence    Past Surgical History:  Procedure Laterality Date  . ABDOMINAL AORTIC ENDOVASCULAR STENT GRAFT Bilateral 05/18/2019   Procedure: ABDOMINAL AORTIC ENDOVASCULAR STENT GRAFT;  Surgeon: DAlgernon Huxley MD;  Location: [facility] ORS;  Service: Vascular;  Laterality: Bilateral;  . ANKLE SURGERY    . Arch aortogram and carotid aortogram  06/02/2006   Dr FOneida Alardid surgery  . BASAL CELL  CARCINOMA EXCISION  04/2016   Dermatology  . CARDIAC CATHETERIZATION  01/30/1998    (Dr. Linard Millers): Native LAD & mRCA CTO. 90% D1. Mod Cx. SVG-D1 CTO. SVG-RCA & SVG-OM along with LIMA-LAD patent;  LV NORMAL Fxn  . CAROTID ENDARTERECTOMY Left Oct. 29, 2007   Dr. Oneida Alar:  . CATARACT EXTRACTION W/PHACO Right 03/05/2017   Procedure: CATARACT EXTRACTION PHACO AND INTRAOCULAR LENS PLACEMENT (IOC);  Surgeon: Leandrew Koyanagi, MD;  Location: [facility] ORS;  Service: Ophthalmology;  Laterality: Right;  [place] 00:58.5 AP% 10.6 CDE 6.20 Fluid Pack lot # [id number]  . CATARACT EXTRACTION W/PHACO Left 04/15/2017   Procedure: CATARACT EXTRACTION  PHACO AND INTRAOCULAR LENS PLACEMENT ([place])  Left;  Surgeon: Leandrew Koyanagi, MD;  Location: [place];  Service: Ophthalmology;  Laterality: Left;  . COLONOSCOPY WITH PROPOFOL N/A 09/15/2017   Procedure: COLONOSCOPY WITH PROPOFOL;  Surgeon: Lucilla Lame, MD;  Location: [facility] ENDOSCOPY;  Service: Endoscopy;  Laterality: N/A;  . COLONOSCOPY WITH PROPOFOL N/A 10/26/2018   Procedure: COLONOSCOPY WITH PROPOFOL;  Surgeon: Lucilla Lame, MD;  Location: [facility] ENDOSCOPY;  Service: Endoscopy;  Laterality: N/A;  . CORONARY ARTERY BYPASS GRAFT  1998   LIMA-LAD, SVG-OM, SVG-RPDA, SVG-DIAG  . CPET / MET  12/2012   Mild chronotropic incompetence - read 82% of predicted; also reduced effort; peak VO2 15.7 / 75% (did not reach Max effort) -- suggested ischemic response in last 1.5 minutes of exercise. Normal pulmonary function on PFTs but poor response to  . ENDOVASCULAR REPAIR/STENT GRAFT N/A 05/18/2019   Procedure: ENDOVASCULAR REPAIR/STENT GRAFT;  Surgeon: Algernon Huxley, MD;  Location: [facility] INVASIVE CV LAB;; EVAR - 23 mm prox, 12 cm distal & x 18 cm length Gore Excluder Endoprosthesis Main body (via RFA distal to lowest Renal A); 63m x 14 cm L Contralateral Limb for L Iliac - extended with 8 mm x 6cm LifeStar stent; no additional R Iliac PTA needed.   . ESOPHAGOGASTRODUODENOSCOPY (EGD) WITH PROPOFOL N/A 09/15/2017   Procedure: ESOPHAGOGASTRODUODENOSCOPY (EGD) WITH PROPOFOL;  Surgeon: WLucilla Lame MD;  [facility];  Service: Endoscopy;  Laterality: N/A;  . FRACTURE SURGERY Right 03/19/2015   wrist  . HIATAL HERNIA REPAIR    . ILIAC ARTERY STENT  12/15/2005   PTA and direct stenting rgt and lft common iliac arteries  . KYPHOPLASTY N/A 04/05/2019   Procedure: KYPHOPLASTY T12, L3, L4 L5;  Surgeon: MHessie Knows MD;  Location: [facility] ORS;  Service: Orthopedics;  Laterality: N/A;  . NM GATED MYOVIEW ([place])  05/'19; 8/'20   a) [facility]: Low Risk - no ischemia or infarct.  EF > 65%. no RWMA;; b)  ([facility]: Normal wall motion.  No ischemia or infarction.  . OPEN REDUCTION INTERNAL FIXATION (ORIF) DISTAL RADIAL FRACTURE Left 01/13/2019   Procedure: OPEN REDUCTION INTERNAL FIXATION (ORIF) DISTAL  RADIAL FRACTURE - LEFT - SLEEP APNEA;  Surgeon: MHessie Knows MD;  Location: [facility] ORS;  Service: Orthopedics;  Laterality: Left;  . ORIF ELBOW FRACTURE Right 01/01/2017   Procedure: OPEN REDUCTION INTERNAL FIXATION (ORIF) ELBOW/OLECRANON FRACTURE;  Surgeon: MHessie Knows MD;  Location: [facility] ORS;  Service: Orthopedics;  Laterality: Right;  . ORIF WRIST FRACTURE Right 03/19/2015   Procedure: OPEN REDUCTION INTERNAL FIXATION (ORIF) WRIST FRACTURE;  Surgeon: MHessie Knows MD;  Location: [facility] ORS;  Service: Orthopedics;  Laterality: Right;  . THORACIC AORTA - CAROTID ANGIOGRAM  October 2007   Dr. 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  TRANSTHORACIC ECHOCARDIOGRAM  February 2016   [facility]: Normal LV function. Dilated left atrium.  Domingo Dimes ECHOCARDIOGRAM  04/2019   [facility] April 20, 2019): EF 55-60 %.  Mild LVH.  Relatively normal valves.   Social History:  reports that he quit smoking about 22 years ago. His smoking use included cigarettes. He has never used smokeless tobacco. He reports that he does not drink alcohol or use drugs.   Allergies  Allergen Reactions  . Clonazepam Other (See Comments)    Altered mental status  . Ropinirole Swelling    Other reaction(s): Hallucination  . Codeine Other (See Comments)    Altered mental status.  . Niacin And Related Other (See Comments)    Flushing of skin  . Tramadol     Other reaction(s): Hallucination  . Hydrocodone     confusion  . Oxycodone Other (See Comments)    confusion confusion    Family History  Problem Relation Age of Onset  . Ulcers Mother        Peptic  . Dementia Mother   . Alcohol abuse Father     Prior to  Admission medications   Medication Sig Start Date End Date Taking? Authorizing Provider  acetaminophen (TYLENOL) 500 MG tablet Take 500-1,000 mg by mouth every 6 (six) hours as needed for mild pain or moderate pain.    Yes [provider]  Ascorbic Acid (VITAMIN C) 1000 MG tablet Take 1,000 mg by mouth daily.   Yes [provider]  cephALEXin (KEFLEX) 250 MG capsule Take 1 capsule (250 mg total) by mouth 4 (four) times daily for 10 days. 12/25/19 01/04/20 Yes Earleen Newport, MD  Cyanocobalamin (VITAMIN B-12) 500 MCG SUBL Place 500 mcg under the tongue daily.    Yes [provider]  donepezil (ARICEPT) 10 MG tablet Take 10 mg by mouth at bedtime.  11/18/17  Yes [provider]  ELIQUIS 5 MG TABS tablet TAKE 1 TABLET BY MOUTH TWICE DAILY Patient taking differently: Take 5 mg by mouth 2 (two) times daily.  01/14/19  Yes Leonie Man, MD  finasteride (PROSCAR) 5 MG tablet Take 1 tablet (5 mg total) by mouth daily. 01/31/19  Yes Stoioff, Ronda Fairly, MD  fluticasone (FLONASE) 50 MCG/ACT nasal spray PLACE TWO SPRAYS IN BOTH NOSRTILS EVERY DAY AS NEEDED FOR ALLERGIES 10/28/19  Yes [name], MD  lisinopril (ZESTRIL) 2.5 MG tablet Take 1 tablet (2.5 mg total) by mouth 2 (two) times daily. 10/24/19  Yes [name], MD  Magnesium Oxide 500 MG TABS Take 500 mg by mouth every evening.   Yes [provider]  metoprolol tartrate (LOPRESSOR) 25 MG tablet Take 1 tablet (25 mg total) by mouth 2 (two) times daily. *OFFICE VISIT NEEDED FOR FURTHER REFILLS* 05/06/19  Yes Leonie Man, MD  mirabegron ER (MYRBETRIQ) 50 MG TB24 tablet Take 1 tablet (50 mg total) by mouth daily. Patient taking differently: Take 50 mg by mouth every evening.  12/15/18  Yes Stoioff, Ronda Fairly, MD  ondansetron (ZOFRAN ODT) 4 MG disintegrating tablet Take 1 tablet (4 mg total) by mouth every 8 (eight) hours as needed for nausea or vomiting. 12/25/19  Yes Earleen Newport, MD    pantoprazole (PROTONIX) 40 MG tablet TAKE ONE TABLET BY MOUTH EVERY DAY 11/02/19  Yes [name], MD  rosuvastatin (CRESTOR) 20 MG tablet TAKE ONE TABLET BY MOUTH EVERY DAY Patient taking differently: Take 20 mg by mouth every evening.  10/01/19  Yes [name], MD  tamsulosin (FLOMAX) 0.4 MG CAPS capsule Take 1 capsule (0.4 mg total) by mouth every evening. 06/07/19  Yes Stoioff, Ronda Fairly, MD  umeclidinium-vilanterol (ANORO ELLIPTA) 62.5-25 MCG/INH AEPB Inhale 1 puff into the lungs daily.  10/25/19  Yes [provider]  ezetimibe (ZETIA) 10 MG tablet Take 1 tablet (10 mg total) by mouth daily. Patient taking differently: Take 10 mg by mouth at bedtime.  04/19/19   [name], MD  Multiple Vitamins-Minerals (PRESERVISION AREDS 2 PO) Take 1 tablet by mouth 2 (two) times daily.     [provider]  nitroGLYCERIN (NITROSTAT) 0.4 MG SL tablet Place 1 tablet (0.4 mg total) under the tongue every 5 (five) minutes as needed for chest pain. 01/12/17   [name], MD  NON FORMULARY Swish and spit 5 mLs at bedtime. Doxycycline/dexamethasone/nystatin/diphenhydramine compounded mouth elixir    [provider]   Physical Exam: Vitals:   12/26/19 1711 12/26/19 1725 12/26/19 1726 12/26/19 1735  BP: (!) 145/75 135/71    Pulse: 92 86 87 87  Resp: 17 14 (!) 21 17  Temp: 97.8 F (36.6 C)     TempSrc:      SpO2: 97% 94% 94% 93%     General exam: Moderately built and nourished patient, relating a pain 5 out of 10  Head, eyes and ENT: Nontraumatic and normocephalic. Pupils equally reacting to light and accommodation. Oral mucosa moist.  Neck: Supple. No JVD, carotid bruit or thyromegaly.  Lymphatics: No lymphadenopathy.  Respiratory system: Clear to auscultation. No increased work of breathing.  Cardiovascular system: S1 and S2 heard, RRR.  No appreciated JVD no lower extremity edema  Gastrointestinal system: Positive bowel sounds soft nontender  nondistended  Central nervous system: He is awake alert and oriented x4 very sharp in his thinking nonfocal moving all 4 extremities without any difficulty sensation is intact  Extremities: Symmetric 5 x 5 power. Peripheral pulses symmetrically felt.   Skin: No rashes or acute findings.  Musculoskeletal system: Negative exam.  Psychiatry: Pleasant and cooperative.   Labs on Admission:  Basic Metabolic Panel: Recent Labs  Lab 12/24/19 0734 12/24/19 2052 12/25/19 1409  NA 139 141 138  K 4.2 3.8 4.2  CL 109 108 105  CO2 '23 22 25  ' GLUCOSE 95 112* 120*  BUN '20 23 20  ' CREATININE 0.89 1.09 1.17  CALCIUM 8.9 9.5 9.3   Liver Function Tests: Recent Labs  Lab 12/24/19 0734 12/24/19 2052 12/25/19 1409  AST '17 18 19  ' ALT '12 12 13  ' ALKPHOS 88 99 88  BILITOT 1.0 1.0 1.9*  PROT 6.6 7.2 6.9  ALBUMIN 3.9 4.1 4.1   Recent Labs  Lab 12/24/19 2052 12/25/19 1409  LIPASE 23 17   No results for input(s): AMMONIA in the last 168 hours. CBC: Recent Labs  Lab 12/24/19 0734 12/24/19 2052 12/25/19 1409  WBC 7.8 9.4 14.0*  HGB 12.2* 13.5 13.8  HCT 37.7* 41.1 41.0  MCV 94.7 93.8 90.7  PLT 133* 149* 141*   Cardiac Enzymes: No results for input(s): CKTOTAL, CKMB, CKMBINDEX, TROPONINI in the last 168 hours.  BNP (last 3 results) No results for input(s): PROBNP in the last 8760 hours. CBG: No results for input(s): GLUCAP in the last 168 hours.  Radiological Exams on Admission: CT ABDOMEN PELVIS W CONTRAST  Result Date: 12/25/2019 CLINICAL DATA:  Left lower quadrant abdominal pain, vomiting EXAM: CT ABDOMEN AND PELVIS WITH CONTRAST TECHNIQUE: Multidetector CT imaging of  the abdomen and pelvis was performed using the standard protocol following bolus administration of intravenous contrast. CONTRAST:  161m OMNIPAQUE IOHEXOL 300 MG/ML  SOLN COMPARISON:  04/12/2019 FINDINGS: Lower chest: Bibasilar scarring. Three-vessel coronary artery calcifications. Hepatobiliary: No solid liver  abnormality is seen. Small gallstones in the dependent gallbladder. No gallbladder wall thickening, or biliary dilatation. Pancreas: Unremarkable. No pancreatic ductal dilatation or surrounding inflammatory changes. Spleen: Normal in size without significant abnormality. Adrenals/Urinary Tract: Adrenal glands are unremarkable. 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 Extensive fat stranding about the left kidney. Additional tiny nonobstructive calculus of the nonobstructed superior pole of the left kidney. Bladder is unremarkable. Stomach/Bowel: Stomach is within normal limits. Appendix appears normal. No evidence of bowel wall thickening, distention, or inflammatory changes. Descending and sigmoid diverticulosis. Vascular/Lymphatic: Status post interval aortobiiliac stent endograft repair of an infrarenal abdominal aortic aneurysm. No enlarged abdominal or pelvic lymph nodes. Reproductive: No mass or other significant abnormality. Other: No abdominal wall hernia or abnormality. No abdominopelvic ascites. Musculoskeletal: No acute or significant osseous findings. IMPRESSION: 1. 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 2.  Extensive fat stranding about the left kidney. 3. Additional tiny nonobstructive calculus of the nonobstructed superior pole of the left kidney. 4. Status post interval aortobiiliac stent endograft repair of an infrarenal abdominal aortic aneurysm. Aortic Atherosclerosis (ICD10-I70.0). Electronically Signed   By: AEddie CandleM.D.   On: 12/25/2019 18:48   DG OR UROLOGY CYSTO IMAGE ([facility] ONLY)  Result Date: 12/26/2019 There is no interpretation for this exam.  This order is for images  obtained during a surgical procedure.  Please See "Surgeries" Tab for more information regarding the procedure.    EKG: Independently reviewed. None  Assessment/Plan Sepsis due to UTI ([facility] in the setting of a left ureteral stone with hydronephrosis: He is status post cystoscopy with left stent placement has a little bit of hematuria in the Foley catheter. I will go ahead and start him on IV Rocephin Urine culture has been sent, will go ahead and send for blood cultures. I will continue to monitor him over the next 24-hour.  If he remains stable he can probably be discharged tomorrow.  Atherosclerotic heart disease of native coronary artery without angina pectoris - s/p CABG, occluded SVG-D1; patent LIMA-LAD, SVG-OM, SVG- RCA Continue aspirin resume Eliquis in the morning.  Essential hypertension We will hold ARB continue metoprolol, his blood pressure seems to be stable will use hydralazine IV as needed.  Aorto-iliac disease ([place] Resume Eliquis in the morning.  Overweight (BMI 25.0-29.9) -- Wgt back up Noted  PAF (paroxysmal atrial fibrillation) (HCC) CHA2DS2-VASc = 4. AC = Eliquis Continue metoprolol, resume Eliquis in the morning.  History of COPD: Continue Ellipta or albuterol inhaled as needed for shortness of breath.     DVT Prophylaxis: Eliquis Code Status: Full  Family Communication: Wife  Disposition Plan: inpatient      It is my clinical opinion that admission to INPATIENT is reasonable and necessary in this 86y.o. male . presenting with symptoms left flank pain hematuria found to have a 1 cm stone on CT scan with left hydronephrosis status post cystoscopy, will bring him into the hospital start empirically on IV Rocephin  check blood cultures urine cultures are pending  Given the aforementioned, the predictability of an adverse outcome is felt to be significant. I expect that the patient will require at least 2 midnights in the hospital to treat this  condition.  Charlynne Cousins MD [facility]   12/26/2019, 5:38 PM

## 2019-12-26 NOTE — Assessment & Plan Note (Signed)
S/P EVAR - Dr. Rhea Pink 05/2019 -> no further abdominal pain.   We will defer further follow-up to Dr. Lucky Cowboy

## 2019-12-26 NOTE — Progress Notes (Signed)
   12/26/2019 1:28 PM   [name] 02/03/1934 [id number]  Reason for visit: Left flank pain  HPI: I saw [name] in urology clinic today as an add-on for left-sided flank pain secondary to a left ureteral stone.  He is regularly followed by Dr. Bernardo Heater.  He is an 86 year old male who was seen in the ER last night with severe left-sided flank pain and nausea and vomiting, and found to have a 1 cm obstructing stone in the left lower pole ureter in a partially duplicated system.  He is on anticoagulation with Eliquis for cardiac disease.  He was afebrile with stable vital signs in the ED, and urinalysis was completely benign and he was discharged with close urology follow-up.  His wife reports he has been more somnolent today, and continues to complain of left-sided flank pain and severe nausea and vomiting.  He has a low-grade fever of 99.2 in clinic today.  He has never had any prior surgeries for stones.  He had 2 bites of an egg sandwich early this morning at 8 AM that he vomited, and has had just a few sips of water during the day.   Physical Exam: BP 121/70   Pulse 79   Temp 99.2 F (37.3 C) (Oral)   Ht 5\' 11"  (1.803 m)   Wt 196 lb (88.9 kg)   BMI 27.34 kg/m    Constitutional: Ill-appearing, somnolent Cardiac: Regular rate and rhythm Respiratory: Clear to auscultation bilaterally GU: Left CVA tenderness  Laboratory Data: Reviewed  Pertinent Imaging: I have personally reviewed the CT.  Partially duplicated left-sided system with 1 cm obstructing stone in the left lower pole ureter.  Severe perinephric stranding worrisome for upstream pyelonephritis.  Assessment & Plan:   In summary, he is an 86 year old comorbid male with low-grade fever of 99.2 and severe perinephric standing on CT scan with obstructing 1 cm left ureteral stone and a partially duplicated system.  Even though urinalysis is benign, with his somnolence and low-grade fever and CT findings, I am  concerned for upstream infection.  We discussed the need for drainage in the setting of an infected and obstructed system.  A ureteral stent is a small plastic tube that is placed cystoscopically with one end in the kidney and the other end in the bladder that allows the infection from the kidney to drain, and relieves pain from the obstructing stone.  We discussed the risks at length including bleeding, infection, sepsis, death, ureteral injury, and stent related symptoms including urgency/frequency/dysuria/flank pain/gross hematuria.  There is a low, but not 0, risk of inability to pass the ureteral stent alongside the stone from below which would require percutaneous nephrostomy tube by interventional radiology.  Finally, we discussed possible prolonged hospitalization and recovery, possible temporary Foley catheter placement, and 10 to 14-day course of antibiotics.  We reviewed the need for a follow-up procedure for definitive management of their stone when the infection has been treated in 2 to 3 weeks with either ureteroscopy/laser lithotripsy.  Procedure the OR this afternoon for urgent left ureteral stent placement Admit to hospitalist, continue broad-spectrum antibiotics  I spent 50 total minutes on the day of the encounter including pre-visit review of the medical record, face-to-face time with the patient, and post visit ordering of labs/imaging/tests.  Billey Co, [facility] [address] [place], [place] [address] [phone]

## 2019-12-26 NOTE — Transfer of Care (Signed)
Immediate Anesthesia Transfer of Care Note  Patient: [name]  Procedure(s) Performed: CYSTOSCOPY WITH STENT PLACEMENT (Left )  Patient Location: PACU  Anesthesia Type:General  Level of Consciousness: awake, alert  and oriented  Airway & Oxygen Therapy: Patient Spontanous Breathing  Post-op Assessment: Post -op Vital signs reviewed and stable  Post vital signs: stable  Last Vitals:  Vitals Value Taken Time  BP 145/75 12/26/19 1711  Temp 36.6 C 12/26/19 1711  Pulse 88 12/26/19 1714  Resp 19 12/26/19 1714  SpO2 100 % 12/26/19 1714  Vitals shown include unvalidated device data.  Last Pain:  Vitals:   12/26/19 1449  TempSrc: Temporal  PainSc: 0-No pain         Complications: No apparent anesthesia complications

## 2019-12-26 NOTE — Assessment & Plan Note (Signed)
My understanding was that this was being followed by vascular surgery here at [facility].  However, he is now being followed by Dr. Lucky Cowboy of [facility] after EVAR with Left Iliac Stenting.  He is not necessarily having any claudication symptoms, no ulcers or lesions.

## 2019-12-26 NOTE — Assessment & Plan Note (Signed)
Overall stable no cardiac standpoint.  No active anginal symptoms.  He has now had several Myoview test (both in 2019 and 2020) that have been nonischemic with normal EF.  With the level of exertion that he is able to achieve given his deconditioning, he is not having any active angina.    Plan:   Continue on low-dose lisinopril and metoprolol along with Zetia and rosuvastatin.  Not requiring any as needed nitroglycerin.  Not on aspirin/Plavix because of Eliquis.   As mentioned in my previous note.  Would not do any further ischemic evaluation unless symptoms warrant.

## 2019-12-26 NOTE — Anesthesia Postprocedure Evaluation (Signed)
Anesthesia Post Note  Patient: [name]  Procedure(s) Performed: CYSTOSCOPY WITH STENT PLACEMENT (Left )  Patient location during evaluation: PACU Anesthesia Type: General Level of consciousness: awake and alert Pain management: pain level controlled Vital Signs Assessment: post-procedure vital signs reviewed and stable Respiratory status: spontaneous breathing and respiratory function stable Cardiovascular status: stable Anesthetic complications: no     Last Vitals:  Vitals:   12/26/19 1744 12/26/19 1755  BP: (!) 125/57   Pulse: 84 82  Resp: 18 16  Temp:    SpO2: 93% 95%    Last Pain:  Vitals:   12/26/19 1755  TempSrc:   PainSc: 4                  Alisha Bacus K

## 2019-12-26 NOTE — Anesthesia Procedure Notes (Signed)
Procedure Name: Intubation Date/Time: 12/26/2019 4:50 PM Performed by: Nelda Marseille, CRNA Pre-anesthesia Checklist: Patient identified, Patient being monitored, Timeout performed, Emergency Drugs available and Suction available Patient Re-evaluated:Patient Re-evaluated prior to induction Oxygen Delivery Method: Circle system utilized Preoxygenation: Pre-oxygenation with 100% oxygen Induction Type: IV induction Ventilation: Mask ventilation without difficulty Laryngoscope Size: Mac, 3 and McGraph Grade View: Grade II Tube type: Oral Tube size: 7.5 mm Number of attempts: 1 Airway Equipment and Method: Stylet Placement Confirmation: ETT inserted through vocal cords under direct vision,  positive ETCO2 and breath sounds checked- equal and bilateral Secured at: 21 cm Tube secured with: Tape Dental Injury: Teeth and Oropharynx as per pre-operative assessment

## 2019-12-26 NOTE — Op Note (Signed)
Date of procedure: 12/26/19  Preoperative diagnosis:  1. Left proximal ureteral stone, UTI  Postoperative diagnosis:  1. Same  Procedure: 1. Cystoscopy, left ureteral stent placement  Surgeon: Nickolas Madrid, MD  Anesthesia: General  Complications: None  Intraoperative findings:  1.  Very abnormal prostate anatomy with cystic prostatic urethra and a high bladder neck 2.  Trabeculated bladder, but no bladder tumors 3.  Uncomplicated left ureteral stent placement and lower pole moiety alongside the left proximal ureteral stone 4.  Complex prostate anatomy required Foley catheter placement over a wire  EBL: None  Specimens: Urinalysis for culture  Drains: Left 6 French by 28 cm ureteral stent, 18 French Foley  Indication: DIESEL [name] is a 86 y.o. 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  In clinic today he had fever to 99.2 and changes in mental status worrisome for upstream infection despite benign urinalysis.  After reviewing the management options for treatment, they elected to proceed with the above surgical procedure(s). We have discussed the potential benefits and risks of the procedure, side effects of the proposed treatment, the likelihood of the patient achieving the goals of the procedure, and any potential problems that might occur during the procedure or recuperation. Informed consent has been obtained.  Description of procedure:  The patient was taken to the operating room and general anesthesia was induced. SCDs were placed for DVT prophylaxis. The patient was placed in the dorsal lithotomy position, prepped and draped in the usual sterile fashion, and preoperative antibiotics(ceftriaxone) were administered. A preoperative time-out was performed.   A 21 French rigid cystoscope was used to intubate the urethra.  The urethra was followed proximally towards the bladder and the prostate anatomy  was grossly abnormal with a dilated prostatic fossa and a high bladder neck.  Cystoscopy showed moderate to severe bladder trabeculations but no frank bladder tumors.  The ureteral orifices were bilateral and close to the bladder neck.    On fluoroscopy I could clearly see the stone with retained contrast from his CT with contrast last night in the lower pole moiety, there was minimal retained contrast in the upper pole moiety.  I advanced a wire and fortunately it passed easily alongside the stone into the lower pole moiety.  As this was the more obstructed moiety, I elected to place a stent over the wire.  A 6 French by 28 cm ureteral stent was placed over the wire with an excellent curl in the lower pole moiety as well as under direct vision the bladder.  There is purulent drainage of urine through the side ports of the stent.  I attempted to pass a wire alongside the stent to perform a retrograde pyelogram and possibly place an additional ureteral stent in the upper pole moiety.  However, after 10 to 15 minutes of attempted cannulation, it was clear that the UO would not even allow a sensor wire alongside the indwelling stent, let alone an additional upper pole stent.  A Super Stiff wire was passed into the bladder through the scope and the scope was removed.  An 25 French coud Foley catheter was turned into a council tip by cutting the tip off, and it was placed over a Super Stiff wire with return of light pink urine.  10 cc were placed in the balloon.  Disposition: Stable to PACU  Plan: Admit to hospitalist and continue antibiotics Follow-up urine culture We will plan for definitive treatment  of stone on the left side with ureteroscopy and laser lithotripsy in 2 to 3 weeks once infection treated  Nickolas Madrid, MD

## 2019-12-26 NOTE — Assessment & Plan Note (Deleted)
My understanding was that this was being followed by vascular surgery here at [facility].  However, he is now being followed by Dr. Lucky Cowboy of [facility] after EVAR with Left Iliac Stenting.  He is not necessarily having any claudication symptoms, no ulcers or lesions.

## 2019-12-26 NOTE — Assessment & Plan Note (Signed)
Rate controlled.  Is on low-dose metoprolol with no signs or symptoms of tachycardia or bradycardia. Remains on Eliquis for anticoagulation.   Okay to hold Eliquis preop for procedures.  Would hold for 24 hours for minor procedure, 48 hours for moderate risk procedures, and 72 hours for high risk procedure such as neurologic/spine procedures.

## 2019-12-26 NOTE — Telephone Encounter (Signed)
If [name] is having nausea and vomiting to the point where he cannot eat or drink, he needs to go to the ED.

## 2019-12-26 NOTE — Telephone Encounter (Signed)
Pt daughter called asking for ER follow appt per ER pt has 1cm stone. Daughter states pt is in pain, unable to eat or drink, nauseated, if pt tries to eat/drink pt vomits. Appt made.

## 2019-12-26 NOTE — Anesthesia Preprocedure Evaluation (Addendum)
Anesthesia Evaluation  Patient identified by MRN, date of birth, ID band Patient awake    Reviewed: Allergy & Precautions, NPO status , Patient's Chart, lab work & pertinent test results, reviewed documented beta blocker date and time   History of Anesthesia Complications Negative for: history of anesthetic complications  Airway Mallampati: III       Dental  (+) Upper Dentures, Lower Dentures   Pulmonary sleep apnea, Continuous Positive Airway Pressure Ventilation and Oxygen sleep apnea , COPD,  COPD inhaler, former smoker,           Cardiovascular hypertension, Pt. on medications and Pt. on home beta blockers + CABG and + Peripheral Vascular Disease  + dysrhythmias Atrial Fibrillation (-) Valvular Problems/Murmurs     Neuro/Psych PSYCHIATRIC DISORDERS Anxiety Dementia    GI/Hepatic Neg liver ROS, hiatal hernia, GERD  Medicated and Controlled,  Endo/Other  neg diabetesHypothyroidism   Renal/GU Renal disease (stones)     Musculoskeletal  (+) Arthritis , Osteoarthritis,    Abdominal   Peds  Hematology  (+) anemia ,   Anesthesia Other Findings   Reproductive/Obstetrics                            Anesthesia Physical  Anesthesia Plan  ASA: III and emergent  Anesthesia Plan: General   Post-op Pain Management:    Induction: Intravenous  PONV Risk Score and Plan: 1 and Ondansetron  Airway Management Planned: Oral ETT  Additional Equipment:   Intra-op Plan:   Post-operative Plan:   Informed Consent: I have reviewed the patients History and Physical, chart, labs and discussed the procedure including the risks, benefits and alternatives for the proposed anesthesia with the patient or authorized representative who has indicated his/her understanding and acceptance.       Plan Discussed with:   Anesthesia Plan Comments:        Anesthesia Quick Evaluation

## 2019-12-26 NOTE — Assessment & Plan Note (Signed)
Blood pressure looks good on current meds with lisinopril and metoprolol.  Would not titrate further.

## 2019-12-26 NOTE — Assessment & Plan Note (Signed)
Last available labs were from February 2020.  We have ordered labs to be done this week at the [facility] office.  Continue Zetia plus rosuvastatin for now.  Labs have been controlled.

## 2019-12-27 ENCOUNTER — Ambulatory Visit: Payer: Medicare Other

## 2019-12-27 DIAGNOSIS — I2510 Atherosclerotic heart disease of native coronary artery without angina pectoris: Secondary | ICD-10-CM

## 2019-12-27 DIAGNOSIS — A4150 Gram-negative sepsis, unspecified: Secondary | ICD-10-CM

## 2019-12-27 DIAGNOSIS — I480 Paroxysmal atrial fibrillation: Secondary | ICD-10-CM

## 2019-12-27 DIAGNOSIS — I7409 Other arterial embolism and thrombosis of abdominal aorta: Secondary | ICD-10-CM

## 2019-12-27 DIAGNOSIS — N1330 Unspecified hydronephrosis: Secondary | ICD-10-CM

## 2019-12-27 DIAGNOSIS — N390 Urinary tract infection, site not specified: Secondary | ICD-10-CM

## 2019-12-27 DIAGNOSIS — N200 Calculus of kidney: Secondary | ICD-10-CM

## 2019-12-27 DIAGNOSIS — I10 Essential (primary) hypertension: Secondary | ICD-10-CM

## 2019-12-27 LAB — BASIC METABOLIC PANEL
Anion gap: 10 (ref 5–15)
BUN: 26 mg/dL — ABNORMAL HIGH (ref 8–23)
CO2: 20 mmol/L — ABNORMAL LOW (ref 22–32)
Calcium: 8.4 mg/dL — ABNORMAL LOW (ref 8.9–10.3)
Chloride: 107 mmol/L (ref 98–111)
Creatinine, Ser: 1.22 mg/dL (ref 0.61–1.24)
GFR calc Af Amer: >60 mL/min (ref >60)
GFR calc non Af Amer: 54 mL/min — ABNORMAL LOW (ref >60)
Glucose, Bld: 137 mg/dL — ABNORMAL HIGH (ref 70–99)
Potassium: 4.1 mmol/L (ref 3.5–5.1)
Sodium: 137 mmol/L (ref 135–145)

## 2019-12-27 LAB — CBC
HCT: 33.9 % — ABNORMAL LOW (ref 39.0–52.0)
Hemoglobin: 11.3 g/dL — ABNORMAL LOW (ref 13.0–17.0)
MCH: 31 pg (ref 26.0–34.0)
MCHC: 33.3 g/dL (ref 30.0–36.0)
MCV: 93.1 fL (ref 80.0–100.0)
Platelets: 125 10*3/uL — ABNORMAL LOW (ref 150–400)
RBC: 3.64 MIL/uL — ABNORMAL LOW (ref 4.22–5.81)
RDW: 14.6 % (ref 11.5–15.5)
WBC: 13.1 10*3/uL — ABNORMAL HIGH (ref 4.0–10.5)
nRBC: 0 % (ref 0.0–0.2)

## 2019-12-27 LAB — URINE CULTURE: Culture: NO GROWTH

## 2019-12-27 MED ORDER — SODIUM CHLORIDE 0.9 % IV SOLN: INTRAVENOUS | Status: AC

## 2019-12-27 MED ORDER — CHLORHEXIDINE GLUCONATE CLOTH 2 % EX PADS
6.0000 | MEDICATED_PAD | Freq: Every day | CUTANEOUS | Status: DC
  Administered 2019-12-27: 6 via TOPICAL

## 2019-12-27 NOTE — Progress Notes (Addendum)
Urology Inpatient Progress Note  Subjective: [name] is a 86 y.o. comorbid male admitted on 12/26/2019 with an obstructing 1 cm left proximal ureteral stone with partially duplicated collecting system as well as urosepsis.  He is POD 1 from cystoscopy and left ureteral stent placement with Dr. Diamantina Providence.   Creatinine down today, 1.22.  WBC count down today, 13.1.  Urine culture pending, blood culture pending with no growth at <12 hours.  On antibiotics as below.    He is afebrile, VSS.  Foley catheter in place draining amber urine.  Today, patient reports feeling better.  He denies flank pain and states his abdomen is diffusely sore.  No acute concerns.  Anti-infectives: Anti-infectives (From admission, onward)   Start     Dose/Rate Route Frequency Ordered Stop   12/27/19 1000  cefTRIAXone (ROCEPHIN) 2 g in sodium chloride 0.9 % 100 mL IVPB     2 g 200 mL/hr over 30 Minutes Intravenous Every 24 hours 12/26/19 1842     12/26/19 1440  cefTRIAXone (ROCEPHIN) 1 g in sodium chloride 0.9 % 100 mL IVPB     1 g 200 mL/hr over 30 Minutes Intravenous 30 min pre-op 12/26/19 1440 12/26/19 1640      Current Facility-Administered Medications  Medication Dose Route Frequency Provider Last Rate Last Admin  . 0.9 %  sodium chloride infusion   Intravenous Continuous Charlynne Cousins, MD 75 mL/hr at 12/27/19 0920 New Bag at 12/27/19 0920  . acetaminophen (TYLENOL) tablet 650 mg  650 mg Oral Q6H PRN Charlynne Cousins, MD       Or  . acetaminophen (TYLENOL) suppository 650 mg  650 mg Rectal Q6H PRN Charlynne Cousins, MD      . albuterol (PROVENTIL) (2.5 MG/3ML) 0.083% nebulizer solution 2.5 mg  2.5 mg Inhalation Q4H PRN Charlynne Cousins, MD      . apixaban [facility]) tablet 5 mg  5 mg Oral BID Charlynne Cousins, MD   5 mg at 12/27/19 0909  . cefTRIAXone (ROCEPHIN) 2 g in sodium chloride 0.9 % 100 mL IVPB  2 g Intravenous Q24H Charlynne Cousins, MD 200 mL/hr at 12/27/19 0921 2 g at  12/27/19 0921  . Chlorhexidine Gluconate Cloth 2 % PADS 6 each  6 each Topical Daily Charlynne Cousins, MD   6 each at 12/27/19 [phone]  . donepezil (ARICEPT) tablet 10 mg  10 mg Oral QHS Charlynne Cousins, MD   10 mg at 12/26/19 2040  . ezetimibe (ZETIA) tablet 10 mg  10 mg Oral Daily Charlynne Cousins, MD   10 mg at 12/27/19 0910  . finasteride (PROSCAR) tablet 5 mg  5 mg Oral Daily Charlynne Cousins, MD   5 mg at 12/27/19 0908  . hydrALAZINE (APRESOLINE) tablet 10 mg  10 mg Oral Q6H PRN Charlynne Cousins, MD      . magnesium oxide (MAG-OX) tablet 400 mg  400 mg Oral QPM Charlynne Cousins, MD   400 mg at 12/26/19 1902  . metoprolol tartrate (LOPRESSOR) tablet 25 mg  25 mg Oral BID Charlynne Cousins, MD   25 mg at 12/27/19 0907  . mirabegron ER (MYRBETRIQ) tablet 50 mg  50 mg Oral Daily Charlynne Cousins, MD   50 mg at 12/27/19 0910  . nitroGLYCERIN (NITROSTAT) SL tablet 0.4 mg  0.4 mg Sublingual Q5 min PRN Charlynne Cousins, MD      . ondansetron [facility]) tablet 4 mg  4 mg  Oral Q6H PRN Charlynne Cousins, MD       Or  . ondansetron [facility]) injection 4 mg  4 mg Intravenous Q6H PRN Charlynne Cousins, MD      . pantoprazole (PROTONIX) EC tablet 40 mg  40 mg Oral Daily Charlynne Cousins, MD   40 mg at 12/27/19 0909  . polyethylene glycol (MIRALAX / GLYCOLAX) packet 17 g  17 g Oral BID Charlynne Cousins, MD   17 g at 12/27/19 0910  . rosuvastatin (CRESTOR) tablet 20 mg  20 mg Oral Daily Charlynne Cousins, MD   20 mg at 12/27/19 0908  . tamsulosin (FLOMAX) capsule 0.4 mg  0.4 mg Oral QPM Charlynne Cousins, MD   0.4 mg at 12/26/19 1903  . umeclidinium-vilanterol (ANORO ELLIPTA) 62.5-25 MCG/INH 1 puff  1 puff Inhalation Daily Charlynne Cousins, MD   1 puff at 12/27/19 0911  . vitamin B-12 (CYANOCOBALAMIN) tablet 500 mcg  500 mcg Oral Daily Charlynne Cousins, MD   500 mcg at 12/27/19 [id number]   Objective: Vital signs in last 24 hours: Temp:  [97.6 F (36.4  C)-99.2 F (37.3 C)] 98.4 F (36.9 C) (04/27 0905) Pulse Rate:  [69-92] 70 (04/27 0420) Resp:  [11-21] 18 (04/27 0420) BP: (112-145)/(54-77) 122/60 (04/27 0905) SpO2:  [92 %-98 %] 92 % (04/27 0905) Weight:  [88.9 kg] 88.9 kg (04/26 2059)  Intake/Output from previous day: 04/26 0701 - 04/27 0700 In: 863.7 [I.V.:763.7; IV Piggyback:100] Out: 250 [Urine:250] Intake/Output this shift: Total I/O In: 240 [P.O.:240] Out: 250 [Urine:250]  Physical Exam Vitals and nursing note reviewed.  Constitutional:      General: He is not in acute distress.    Appearance: He is not ill-appearing, toxic-appearing or diaphoretic.  HENT:     Head: Normocephalic and atraumatic.  Pulmonary:     Effort: Pulmonary effort is normal. No respiratory distress.  Skin:    General: Skin is warm and dry.  Neurological:     Mental Status: He is alert and oriented to person, place, and time.  Psychiatric:        Mood and Affect: Mood normal.        Behavior: Behavior normal.    Lab Results:  Recent Labs    12/26/19 2010 12/27/19 0520  WBC 15.8* 13.1*  HGB 12.9* 11.3*  HCT 38.9* 33.9*  PLT 123* 125*   BMET Recent Labs    12/26/19 2010 12/27/19 0520  NA 136 137  K 4.1 4.1  CL 105 107  CO2 24 20*  GLUCOSE 137* 137*  BUN 21 26*  CREATININE 1.33* 1.22  CALCIUM 8.6* 8.4*   Assessment & Plan: 86 year old comorbid male improving clinically following urgent left ureteral stent placement in the setting of an obstructing 1 cm proximal left ureteral stone with urosepsis.  Recommendations: -Discontinue Foley today -Okay for discharge today or tomorrow from urologic perspective, recommend Bactrim DS twice daily x10 days, continue to follow urine cultures -We will arrange definitive stone management in 2 to 3 weeks: Left URS/LL/stent exchange  Debroah Loop, PA-C 12/27/2019

## 2019-12-27 NOTE — Progress Notes (Signed)
The patient is has is alert and oriented x2. Tele-sitter has been order as the patient pulls on lines, CPox monitor cord and triest to jump out of bed/chair. Son has been concern for a risk of falls. Bed alarm has been on yet additional resources have been requested. Tele-sitter set. Low bed has been order and floor mats. Foley catheter had been removed and the patient has been voiding yet is incontinent.

## 2019-12-27 NOTE — Evaluation (Signed)
Physical Therapy Evaluation Patient Details Name: [name] MRN: [id number] DOB: 03-Jan-1934 Today's Date: 12/27/2019   History of Present Illness  Pt admitted for ureteral stone and urosepsis. Initial complaints of L abdominal pain with N/V. Pt is now s/p cystoscopy and stent placement on 4/26. 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   Clinical Impression  Pt is a pleasant 86 year old male who was admitted for ureteral stone and urosepsis. Pt performs bed mobility, transfers, and ambulation with mod assist and limited due to impulsive nature/safety/mobility/balance. Pt demonstrates deficits with strength/mobility/cogntion. Pt is currently not at baseline level as he was previously participating in OP PT ambulating without assist. Does have [name] 8-12, M-F for ADLs. Would benefit from skilled PT to address above deficits and promote optimal return to PLOF; recommend transition to STR upon discharge from acute hospitalization.     Follow Up Recommendations SNF    Equipment Recommendations  None recommended by PT    Recommendations for Other Services       Precautions / Restrictions Precautions Precautions: Fall Restrictions Weight Bearing Restrictions: No      Mobility  Bed Mobility Overal bed mobility: Needs Assistance Bed Mobility: Supine to Sit     Supine to sit: Mod assist     General bed mobility comments: needs assist for sliding B LEs off bed. Needs assist for trunk support. Once seated at EOB, upright posture noted. Impulsive with poor safety awareness  Transfers Overall transfer level: Needs assistance Equipment used: Rolling walker (2 wheeled) Transfers: Sit to/from Stand Sit to Stand: Mod assist         General transfer comment: takes several attempts for standing with momentum needed. Once standing, post lean noted  Ambulation/Gait Ambulation/Gait assistance: Mod assist;Min assist Gait Distance (Feet): 40  Feet Assistive device: Rolling walker (2 wheeled) Gait Pattern/deviations: Step-to pattern     General Gait Details: 2 attempts for ambulation. Both times limited by incontient episode in floor. Ceased further ambulation. Very unsteady with heavy cues needed for sequencing  Stairs            Wheelchair Mobility    Modified Rankin (Stroke Patients Only)       Balance Overall balance assessment: Needs assistance Sitting-balance support: Feet supported;Single extremity supported Sitting balance-Leahy Scale: Fair     Standing balance support: Bilateral upper extremity supported Standing balance-Leahy Scale: Fair                               Pertinent Vitals/Pain Pain Assessment: Faces Faces Pain Scale: Hurts even more Pain Location: abdominal pain Pain Descriptors / Indicators: Cramping Pain Intervention(s): Limited activity within patient's tolerance    Home Living Family/patient expects to be discharged to:: Private residence Living Arrangements: Spouse/significant other Available Help at Discharge: Family Type of Home: House Home Access: Ramped entrance     Home Layout: One level Home Equipment: Environmental consultant - 2 wheels;Cane - single point      Prior Function Level of Independence: Independent         Comments: has been participating in OP PT. Reports no recent falls     Hand Dominance        Extremity/Trunk Assessment   Upper Extremity Assessment Upper Extremity Assessment: Overall WFL for tasks assessed    Lower Extremity Assessment Lower Extremity Assessment: Generalized weakness(B LE grossly 4/5)       Communication   Communication:  No difficulties  Cognition Arousal/Alertness: Awake/alert Behavior During Therapy: Impulsive Overall Cognitive Status: No family/caregiver present to determine baseline cognitive functioning                                        General Comments      Exercises Other  Exercises Other Exercises: supine/seated ther-ex performed on B LE including alt. marching, LAQ, and SLR. x 10 reps cga  Other Exercises: gown/linen/floor cleaned from incontient episode   Assessment/Plan    PT Assessment Patient needs continued PT services  PT Problem List Decreased strength;Decreased activity tolerance;Decreased balance;Decreased mobility;Decreased cognition       PT Treatment Interventions Gait training;Therapeutic exercise;Balance training    PT Goals (Current goals can be found in the Care Plan section)  Acute Rehab PT Goals Patient Stated Goal: to go home PT Goal Formulation: With patient Time For Goal Achievement: 01/10/20 Potential to Achieve Goals: Good    Frequency Min 2X/week   Barriers to discharge        Co-evaluation               AM-PAC PT "6 Clicks" Mobility  Outcome Measure Help needed turning from your back to your side while in a flat bed without using bedrails?: A Little Help needed moving from lying on your back to sitting on the side of a flat bed without using bedrails?: A Lot Help needed moving to and from a bed to a chair (including a wheelchair)?: A Lot Help needed standing up from a chair using your arms (e.g., wheelchair or bedside chair)?: A Lot Help needed to walk in hospital room?: A Lot Help needed climbing 3-5 steps with a railing? : Total 6 Click Score: 12    End of Session Equipment Utilized During Treatment: Gait belt Activity Tolerance: Patient tolerated treatment well Patient left: in chair;with chair alarm set Nurse Communication: Mobility status PT Visit Diagnosis: Muscle weakness (generalized) (M62.81);Difficulty in walking, not elsewhere classified (R26.2);Unsteadiness on feet (R26.81)    Time: [id number] PT Time Calculation (min) (ACUTE ONLY): 40 min   Charges:   PT Evaluation $PT Eval Moderate Complexity: 1 Mod PT Treatments $Gait Training: 8-22 mins $Therapeutic Exercise: 8-22 mins         [name], PT, DPT [phone]   [name] 12/27/2019, 4:54 PM

## 2019-12-27 NOTE — Progress Notes (Addendum)
[facility] PROGRESS NOTE    Progress Note  [name]  [id number] DOB: Nov 01, 1933 DOA: 12/26/2019 PCP: William Sons, MD     Brief Narrative:   RONAL Foster is an 86 y.o. male past medical history significant for CAD status post CABG x4 on 12/18/1997, ischemic cardiomyopathy, paroxysmal atrial fibrillation on Eliquis, peripheral vascular disease status post left endarterectomy and aortic iliac stent placement in 2007, emphysema, status post EVAR in September 2020 at [facility] went to the urologist office for fever and abdominal pain underwent a CT scan that found a left nephrolithiasis with left hydronephrosis, admitted for cystoscopy with left stent placement on 12/26/2019 started empirically on IV antibiotics  Assessment/Plan:   Sepsis due to UTI in the setting of left urethral nephrolithiasis with hydronephrosis: He is status post cystoscopy with left stent placement on 12/26/2019. With persistent hematuria, started empirically on IV Rocephin.  Has remained afebrile with improving leukocytosis. Culture data is pending. Okay to discharge tomorrow on Bactrim and follow-up with urology as an outpatient.  Length of antibiotics will be determined by blood cultures results.  Acute kidney injury: With a baseline creatinine of less than 1 Multifactorial in the setting of sepsis, ARB and postobstructive uropathy, started empirically on antibiotics and  IV fluids and his creatinine is improving slowly.  We will continue gentle IV fluid hydration. Recheck labs in the morning.  Atherosclerotic heart disease of native coronary artery without angina pectoris - s/p CABG, occluded SVG-D1; patent LIMA-LAD, SVG-OM, SVG- RCA Continue aspirin and Eliquis he is currently asymptomatic.  Essential hypertension: Due to his acute kidney injury ARB was held he was continued on oral metoprolol.  Blood pressure seems to be fairly controlled using hydralazine IV as needed  Peripheral  vascular disease: Continue Eliquis and aspirin.  Paroxysmal atrial fibrillation: Currently rate controlled continue metoprolol and Eliquis.  History of COPD: Continue inhalers seems to be compensated.  Acute confusional state: Avoid benzodiazepines and narcotics, open blinds when is the leg toes and when is Try to reorient him, I had a long conversation with the wife will try to avoid unnecessary medications.   DVT prophylaxis: Eliquis Family Communication:wife. Status is: Inpatient  Remains inpatient appropriate because:Hemodynamically unstable   Dispo: The patient is from: Home              Anticipated d/c is to: Home              Anticipated d/c date is: 1 day              Patient currently is not medically stable to d/c.     Code Status:     Code Status Orders  (From admission, onward)         Start     Ordered   12/26/19 1843  Full code  Continuous     12/26/19 1842        Code Status History    Date Active Date Inactive Code Status Order ID Comments User Context   05/18/2019 1214 05/19/2019 1749 Full Code [id number]  Algernon Huxley, MD Inpatient   01/13/2019 1210 01/13/2019 1559 Full Code [id number]  Hessie Knows, MD Inpatient   01/01/2017 1630 01/01/2017 2140 Full Code [id number]  Hessie Knows, MD Inpatient   03/19/2015 1553 03/19/2015 1943 Full Code [id number]  Hessie Knows, MD Inpatient   Advance Care Planning Activity    Advance Directive Documentation     Most Recent Value  Type of Advance Directive  Healthcare Power  of Attorney, Living will  Pre-existing out of facility DNR order (yellow form or pink MOST form)  --  "MOST" Form in Place?  --        IV Access:    Peripheral IV   Procedures and diagnostic studies:   Abdomen 1 view (KUB)  Result Date: 12/26/2019 CLINICAL DATA:  Kidney stones. Nephrolithiasis. EXAM: ABDOMEN - 1 VIEW COMPARISON:  Abdominopelvic CT yesterday. FINDINGS: There is excreted contrast within the left renal collecting system  and urinary bladder. The left renal collecting system appears partially duplicated with hydronephrosis of the lower pole moiety with 11 mm stone in the upper ureter. The additional nonobstructing stone in the left kidney on CT is not well visualized given residual collecting system contrast. Vague persistent right nephrogram is seen. Excreted contrast in the urinary bladder. Aorto bi-iliac stent in place. Nonobstructing bowel gas pattern. Kyphoplasty within multiple lumbar and lower thoracic vertebra. IMPRESSION: 1. No significant change in positioning of the 11 mm left proximal ureteral stone obstructing lower pole moiety of the left kidney. There is residual excreted contrast within the renal collecting system of the left kidney with lower moiety hydronephrosis. Excreted IV contrast in the urinary bladder. 2. Additional nonobstructing stone in the left kidney is not well visualized with excreted IV contrast obscuring. Electronically Signed   By: Keith Rake M.D.   On: 12/26/2019 20:13   CT ABDOMEN PELVIS W CONTRAST  Result Date: 12/25/2019 CLINICAL DATA:  Left lower quadrant abdominal pain, vomiting EXAM: CT ABDOMEN AND PELVIS WITH CONTRAST TECHNIQUE: Multidetector CT imaging of the abdomen and pelvis was performed using the standard protocol following bolus administration of intravenous contrast. CONTRAST:  172mL OMNIPAQUE IOHEXOL 300 MG/ML  SOLN COMPARISON:  04/12/2019 FINDINGS: Lower chest: Bibasilar scarring. Three-vessel coronary artery calcifications. Hepatobiliary: No solid liver abnormality is seen. Small gallstones in the dependent gallbladder. No gallbladder wall thickening, or biliary dilatation. Pancreas: Unremarkable. No pancreatic ductal dilatation or surrounding inflammatory changes. Spleen: Normal in size without significant abnormality. Adrenals/Urinary Tract: Adrenal glands are unremarkable. 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 Extensive fat stranding about the left kidney. Additional tiny nonobstructive calculus of the nonobstructed superior pole of the left kidney. Bladder is unremarkable. Stomach/Bowel: Stomach is within normal limits. Appendix appears normal. No evidence of bowel wall thickening, distention, or inflammatory changes. Descending and sigmoid diverticulosis. Vascular/Lymphatic: Status post interval aortobiiliac stent endograft repair of an infrarenal abdominal aortic aneurysm. No enlarged abdominal or pelvic lymph nodes. Reproductive: No mass or other significant abnormality. Other: No abdominal wall hernia or abnormality. No abdominopelvic ascites. Musculoskeletal: No acute or significant osseous findings. IMPRESSION: 1. 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 2.  Extensive fat stranding about the left kidney. 3. Additional tiny nonobstructive calculus of the nonobstructed superior pole of the left kidney. 4. Status post interval aortobiiliac stent endograft repair of an infrarenal abdominal aortic aneurysm. Aortic Atherosclerosis (ICD10-I70.0). Electronically Signed   By: Eddie Candle M.D.   On: 12/25/2019 18:48   DG OR UROLOGY CYSTO IMAGE ([facility] ONLY)  Result Date: 12/26/2019 There is no interpretation for this exam.  This order is for images obtained during a surgical procedure.  Please See "Surgeries" Tab for more information  regarding the procedure.     Medical Consultants:    None.  Anti-Infectives:   IV Rocephin  Subjective:    [name] is more pleasant today in a good mood.  Objective:    Vitals:   12/26/19 2024 12/26/19 2059 12/27/19 0200 12/27/19 0420  BP: (!) 112/55 (!) 112/55 113/60 (!) 113/54  Pulse: 82 82 78 70   Resp: 18 18 20 18   Temp: 97.9 F (36.6 C) 97.9 F (36.6 C) 98 F (36.7 C) 97.6 F (36.4 C)  TempSrc: Oral Oral Oral Oral  SpO2: 92%  93% 92%  Weight:  88.9 kg    Height:  5\' 11"  (1.803 m)     SpO2: 92 % O2 Flow Rate (L/min): 1 L/min   Intake/Output Summary (Last 24 hours) at 12/27/2019 0809 Last data filed at 12/27/2019 0701 Gross per 24 hour  Intake 863.73 ml  Output 500 ml  Net 363.73 ml   Filed Weights   12/26/19 2059  Weight: 88.9 kg    Exam: General exam: In no acute distress. Respiratory system: Good air movement and clear to auscultation. Cardiovascular system: S1 & S2 heard, RRR. No JVD. Gastrointestinal system: Abdomen is nondistended, soft and nontender.  Central nervous system: Alert and oriented. No focal neurological deficits. Extremities: No pedal edema. Skin: No rashes, lesions or ulcers    Data Reviewed:    Labs: Basic Metabolic Panel: Recent Labs  Lab 12/24/19 0734 12/24/19 0734 12/24/19 2052 12/24/19 2052 12/25/19 1409 12/25/19 1409 12/26/19 2010 12/27/19 0520  NA 139  --  141  --  138  --  136 137  K 4.2   < > 3.8   < > 4.2   < > 4.1 4.1  CL 109  --  108  --  105  --  105 107  CO2 23  --  22  --  25  --  24 20*  GLUCOSE 95  --  112*  --  120*  --  137* 137*  BUN 20  --  23  --  20  --  21 26*  CREATININE 0.89  --  1.09  --  1.17  --  1.33* 1.22  CALCIUM 8.9  --  9.5  --  9.3  --  8.6* 8.4*   < > = values in this interval not displayed.   GFR Estimated Creatinine Clearance: 48 mL/min (by C-G formula based on SCr of 1.22 mg/dL). Liver Function Tests: Recent Labs  Lab 12/24/19 0734 12/24/19 2052 12/25/19 1409  AST 17 18 19   ALT 12 12 13   ALKPHOS 88 99 88  BILITOT 1.0 1.0 1.9*  PROT 6.6 7.2 6.9  ALBUMIN 3.9 4.1 4.1   Recent Labs  Lab 12/24/19 2052 12/25/19 1409  LIPASE 23 17   No results for input(s): AMMONIA in the last 168 hours. Coagulation profile No results for input(s): INR, PROTIME in the last 168  hours. COVID-19 Labs  No results for input(s): DDIMER, FERRITIN, LDH, CRP in the last 72 hours.  Lab Results  Component Value Date   SARSCOV2NAA NEGATIVE 12/26/2019   SARSCOV2NAA NEGATIVE 05/13/2019   [place] NEGATIVE 04/01/2019   [place] NEGATIVE 01/12/2019    CBC: Recent Labs  Lab 12/24/19 0734 12/24/19 2052 12/25/19 1409 12/26/19 2010 12/27/19 0520  WBC 7.8 9.4 14.0* 15.8* 13.1*  NEUTROABS  --   --   --  14.3*  --   HGB 12.2* 13.5 13.8 12.9* 11.3*  HCT 37.7* 41.1 41.0  38.9* 33.9*  MCV 94.7 93.8 90.7 92.8 93.1  PLT 133* 149* 141* 123* 125*   Cardiac Enzymes: No results for input(s): CKTOTAL, CKMB, CKMBINDEX, TROPONINI in the last 168 hours. BNP (last 3 results) No results for input(s): PROBNP in the last 8760 hours. CBG: No results for input(s): GLUCAP in the last 168 hours. D-Dimer: No results for input(s): DDIMER in the last 72 hours. Hgb A1c: No results for input(s): HGBA1C in the last 72 hours. Lipid Profile: No results for input(s): CHOL, HDL, LDLCALC, TRIG, CHOLHDL, LDLDIRECT in the last 72 hours. Thyroid function studies: No results for input(s): TSH, T4TOTAL, T3FREE, THYROIDAB in the last 72 hours.  Invalid input(s): FREET3 Anemia work up: No results for input(s): VITAMINB12, FOLATE, FERRITIN, TIBC, IRON, RETICCTPCT in the last 72 hours. Sepsis Labs: Recent Labs  Lab 12/24/19 2052 12/25/19 1409 12/26/19 2010 12/27/19 0520  WBC 9.4 14.0* 15.8* 13.1*   Microbiology Recent Results (from the past 240 hour(s))  Respiratory Panel by RT PCR (Flu A&B, Covid) - Nasopharyngeal Swab     Status: None   Collection Time: 12/26/19  2:32 PM   Specimen: Nasopharyngeal Swab  Result Value Ref Range Status   SARS Coronavirus 2 by RT PCR NEGATIVE NEGATIVE Final    Comment: (NOTE) SARS-CoV-2 target nucleic acids are NOT DETECTED. The SARS-CoV-2 RNA is generally detectable in upper respiratoy specimens during the acute phase of infection. The  lowest concentration of SARS-CoV-2 viral copies this assay can detect is 131 copies/mL. A negative result does not preclude SARS-Cov-2 infection and should not be used as the sole basis for treatment or other patient management decisions. A negative result may occur with  improper specimen collection/handling, submission of specimen other than nasopharyngeal swab, presence of viral mutation(s) within the areas targeted by this assay, and inadequate number of viral copies (<131 copies/mL). A negative result must be combined with clinical observations, patient history, and epidemiological information. The expected result is Negative. Fact Sheet for Patients:  PinkCheek.be Fact Sheet for Healthcare Providers:  GravelBags.it This test is not yet ap proved or cleared by the [place] FDA and  has been authorized for detection and/or diagnosis of SARS-CoV-2 by FDA under an Emergency Use Authorization (EUA). This EUA will remain  in effect (meaning this test can be used) for the duration of the COVID-19 declaration under Section 564(b)(1) of the Act, 21 U.S.C. section 360bbb-3(b)(1), unless the authorization is terminated or revoked sooner.    Influenza A by PCR NEGATIVE NEGATIVE Final   Influenza B by PCR NEGATIVE NEGATIVE Final    Comment: (NOTE) The Xpert Xpress SARS-CoV-2/FLU/RSV assay is intended as an aid in  the diagnosis of influenza from Nasopharyngeal swab specimens and  should not be used as a sole basis for treatment. Nasal washings and  aspirates are unacceptable for Xpert Xpress SARS-CoV-2/FLU/RSV  testing. Fact Sheet for Patients: PinkCheek.be Fact Sheet for Healthcare Providers: GravelBags.it This test is not yet approved or cleared by the [place] FDA and  has been authorized for detection and/or diagnosis of SARS-CoV-2 by  FDA under an Emergency  Use Authorization (EUA). This EUA will remain  in effect (meaning this test can be used) for the duration of the  Covid-19 declaration under Section 564(b)(1) of the Act, 21  U.S.C. section 360bbb-3(b)(1), unless the authorization is  terminated or revoked. Performed at [facility], [address] [place], [place] [address]   Culture, blood (Routine X 2) w Reflex to ID Panel  Status: None (Preliminary result)   Collection Time: 12/26/19  8:10 PM   Specimen: BLOOD  Result Value Ref Range Status   Specimen Description BLOOD BLOOD LEFT HAND  Final   Special Requests   Final    BOTTLES DRAWN AEROBIC AND ANAEROBIC Blood Culture adequate volume   Culture   Final    NO GROWTH < 12 HOURS Performed at [facility], [address], [place], [place] [address]    Report Status PENDING  Incomplete  Culture, blood (Routine X 2) w Reflex to ID Panel     Status: None (Preliminary result)   Collection Time: 12/26/19  8:10 PM   Specimen: BLOOD  Result Value Ref Range Status   Specimen Description BLOOD LEFT ANTECUBITAL  Final   Special Requests   Final    BOTTLES DRAWN AEROBIC AND ANAEROBIC Blood Culture adequate volume   Culture   Final    NO GROWTH < 12 HOURS Performed at [facility], [address] [place], [place] [address]    Report Status PENDING  Incomplete     Medications:   . apixaban  5 mg Oral BID  . Chlorhexidine Gluconate Cloth  6 each Topical Daily  . donepezil  10 mg Oral QHS  . ezetimibe  10 mg Oral Daily  . finasteride  5 mg Oral Daily  . magnesium oxide  400 mg Oral QPM  . metoprolol tartrate  25 mg Oral BID  . mirabegron ER  50 mg Oral Daily  . pantoprazole  40 mg Oral Daily  . polyethylene glycol  17 g Oral BID  . rosuvastatin  20 mg Oral Daily  . sodium chloride flush  3 mL Intravenous Q12H  . tamsulosin  0.4 mg Oral QPM  . umeclidinium-vilanterol  1 puff Inhalation Daily  . vitamin B-12  500 mcg Oral Daily   Continuous  Infusions: . sodium chloride    . cefTRIAXone (ROCEPHIN)  IV    . lactated ringers Stopped (12/27/19 0620)      LOS: 1 day   Charlynne Cousins  [facility]  12/27/2019, 8:09 AM

## 2019-12-28 DIAGNOSIS — R531 Weakness: Secondary | ICD-10-CM

## 2019-12-28 DIAGNOSIS — N1330 Unspecified hydronephrosis: Secondary | ICD-10-CM

## 2019-12-28 DIAGNOSIS — N179 Acute kidney failure, unspecified: Secondary | ICD-10-CM

## 2019-12-28 DIAGNOSIS — I480 Paroxysmal atrial fibrillation: Secondary | ICD-10-CM

## 2019-12-28 DIAGNOSIS — R410 Disorientation, unspecified: Secondary | ICD-10-CM

## 2019-12-28 DIAGNOSIS — N200 Calculus of kidney: Secondary | ICD-10-CM

## 2019-12-28 MED ORDER — MELATONIN 5 MG PO TABS
5.0000 mg | ORAL_TABLET | Freq: Every day | ORAL | Status: DC
  Administered 2019-12-28: 5 mg via ORAL
  Filled 2019-12-28: qty 1

## 2019-12-28 MED ORDER — HALOPERIDOL LACTATE 5 MG/ML IJ SOLN: 1.0000 mg | Freq: Four times a day (QID) | INTRAMUSCULAR | Status: DC | PRN

## 2019-12-28 MED ORDER — QUETIAPINE FUMARATE 25 MG PO TABS
25.0000 mg | ORAL_TABLET | Freq: Every day | ORAL | Status: DC
  Administered 2019-12-28: 25 mg via ORAL
  Filled 2019-12-28: qty 1

## 2019-12-28 MED ORDER — SULFAMETHOXAZOLE-TRIMETHOPRIM 800-160 MG PO TABS
1.0000 | ORAL_TABLET | Freq: Two times a day (BID) | ORAL | Status: DC
  Administered 2019-12-29: 1 via ORAL
  Filled 2019-12-28 (×2): qty 1

## 2019-12-28 NOTE — Progress Notes (Addendum)
Patient decline home CPAP at this time, states he is doing fine. Patient on Room Air, no distress noted, sitter at bedside. Will continue to monitor.   2354: Patient on CPAP resting comfortably in bed.

## 2019-12-28 NOTE — NC FL2 (Signed)
[place] LEVEL OF CARE SCREENING TOOL     IDENTIFICATION  Patient Name: [name] Birthdate: Nov 22, 1933 Sex: male Admission Date (Current Location): 12/26/2019  [facility] and [place] Number:  Engineering geologist and Address:         Provider Number: [id number]  Attending Physician Name and Address:  Loletha Grayer, MD  Relative Name and Phone Number:       Current Level of Care: Hospital Recommended Level of Care: [place] Prior Approval Number:    Date Approved/Denied:   PASRR Number: [id number]  Discharge Plan: SNF    Current Diagnoses: Patient Active Problem List   Diagnosis Date Noted  . Sepsis due to gram-negative UTI ([place]) 12/26/2019  . Left nephrolithiasis 12/26/2019  . Hydronephrosis, left 12/26/2019  . Status post abdominal aortic aneurysm (AAA) repair 04/15/2019  . Candidiasis 02/22/2019  . Malignant neoplasm of descending colon ([place])   . Benign neoplasm of descending colon   . Benign neoplasm of transverse colon   . Do not intubate, cardiopulmonary resuscitation (CPR)-only code status 10/08/2018  . Prostate cancer ([place]) 02/25/2018  . Prostatic cyst 01/15/2018  . 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 ([place]) 09/15/2017  . Blood in stool   . Abnormal feces   . Stricture and stenosis of esophagus   . Mild cognitive impairment 08/18/2017  . Senile purpura ([place]) 04/29/2017  . Anemia 04/29/2017  . Frequent falls 04/29/2017  . Acute respiratory failure ([place]) 12/17/2016  . Simple chronic bronchitis ([place]) 12/17/2016  . Benign localized hyperplasia of prostate with urinary obstruction 07/15/2016  . Elevated PSA 07/15/2016  . Incomplete emptying of bladder 07/15/2016  . Urge incontinence 07/15/2016  . Hypothyroidism 04/25/2016  . Macular degeneration 04/25/2016  . Erectile dysfunction due to arterial insufficiency   . Ischemic heart disease with chronotropic incompetence   . CN (constipation) 02/10/2015  . DD  (diverticular disease) 02/10/2015  . Fatigue 02/10/2015  . Lichen planus [id number]  . Restless leg 02/10/2015  . 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 B12 deficiency 02/10/2015  . COPD (chronic obstructive pulmonary disease) ([place]) 11/14/2014  . Post Inflammatory Lung Changes 11/14/2014  . PAF (paroxysmal atrial fibrillation) (HCC) CHA2DS2-VASc = 4. AC = Eliquis   . Insomnia 12/09/2013  . OSA on CPAP   . Dyspnea on exertion - -essentially resolved with BB dose reduction & wgt loss 03/29/2013  . Overweight (BMI 25.0-29.9) -- Wgt back up 01/17/2013  . Atherosclerotic heart disease of native coronary artery without angina pectoris - s/p CABG, occluded SVG-D1; patent LIMA-LAD, SVG-OM, SVG- RCA   . Essential hypertension   . Hyperlipidemia with target LDL less than 70   . Aorto-iliac disease ([place])   . BCC (basal cell carcinoma), eyelid 01/03/2013  . Allergic rhinitis 08/17/2009  . Adaptation reaction 05/28/2009  . Acid reflux 05/28/2009  . Arthritis, degenerative 05/28/2009  . 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 Carotid artery occlusion without infarction 06/01/2006  . PAD (peripheral artery disease) - bilateral common iliac stents 12/15/2005  . Atherosclerotic heart disease of artery bypass graft 09/01/1997    Orientation RESPIRATION BLADDER Height & Weight     Self  Normal External catheter, Incontinent Weight: 88.9 kg Height:  5\' 11"  (180.3 cm)  BEHAVIORAL SYMPTOMS/MOOD NEUROLOGICAL BOWEL NUTRITION STATUS      Continent Diet(2 gram sodium)  AMBULATORY STATUS COMMUNICATION OF NEEDS Skin   Limited Assist Verbally Normal  Personal Care Assistance Level of Assistance              Functional Limitations Info  Sight Sight Info: Impaired        SPECIAL CARE FACTORS FREQUENCY  PT (By licensed PT), OT (By licensed OT)                    Contractures      Additional Factors Info  Code Status, Allergies Code Status Info:  Full Allergies Info: Clonazepam, Ropinirole, Codeine, Niacin And Related, Tramadol, Hydrocodone, Oxycodone           Current Medications (12/28/2019):  This is the current hospital active medication list Current Facility-Administered Medications  Medication Dose Route Frequency Provider Last Rate Last Admin  . acetaminophen (TYLENOL) tablet 650 mg  650 mg Oral Q6H PRN Charlynne Cousins, MD       Or  . acetaminophen (TYLENOL) suppository 650 mg  650 mg Rectal Q6H PRN Charlynne Cousins, MD      . albuterol (PROVENTIL) (2.5 MG/3ML) 0.083% nebulizer solution 2.5 mg  2.5 mg Inhalation Q4H PRN Charlynne Cousins, MD      . apixaban Arne [place]) tablet 5 mg  5 mg Oral BID Charlynne Cousins, MD   5 mg at 12/28/19 0853  . donepezil (ARICEPT) tablet 10 mg  10 mg Oral QHS Charlynne Cousins, MD   10 mg at 12/27/19 2003  . ezetimibe (ZETIA) tablet 10 mg  10 mg Oral Daily Charlynne Cousins, MD   10 mg at 12/28/19 0852  . finasteride (PROSCAR) tablet 5 mg  5 mg Oral Daily Charlynne Cousins, MD   5 mg at 12/28/19 [id number]  . haloperidol lactate (HALDOL) injection 1 mg  1 mg Intravenous Q6H PRN Loletha Grayer, MD      . hydrALAZINE (APRESOLINE) tablet 10 mg  10 mg Oral Q6H PRN Charlynne Cousins, MD      . magnesium oxide (MAG-OX) tablet 400 mg  400 mg Oral QPM Charlynne Cousins, MD   400 mg at 12/27/19 1825  . melatonin tablet 5 mg  5 mg Oral QHS Sharion Settler, NP      . metoprolol tartrate (LOPRESSOR) tablet 25 mg  25 mg Oral BID Charlynne Cousins, MD   25 mg at 12/28/19 0853  . mirabegron ER (MYRBETRIQ) tablet 50 mg  50 mg Oral Daily Charlynne Cousins, MD   50 mg at 12/28/19 0852  . nitroGLYCERIN (NITROSTAT) SL tablet 0.4 mg  0.4 mg Sublingual Q5 min PRN Charlynne Cousins, MD      . ondansetron [facility]) tablet 4 mg  4 mg Oral Q6H PRN Charlynne Cousins, MD       Or  . ondansetron [facility]) injection 4 mg  4 mg Intravenous Q6H PRN Charlynne Cousins, MD      .  pantoprazole (PROTONIX) EC tablet 40 mg  40 mg Oral Daily Charlynne Cousins, MD   40 mg at 12/28/19 0852  . QUEtiapine (SEROQUEL) tablet 25 mg  25 mg Oral QHS Wieting, Richard, MD      . rosuvastatin (CRESTOR) tablet 20 mg  20 mg Oral Daily Charlynne Cousins, MD   20 mg at 12/28/19 [phone]  . [START ON 12/29/2019] sulfamethoxazole-trimethoprim (BACTRIM DS) 800-160 MG per tablet 1 tablet  1 tablet Oral Q12H Wieting, Richard, MD      . tamsulosin [facility]) capsule 0.4 mg  0.4 mg Oral QPM Charlynne Cousins,  MD   0.4 mg at 12/27/19 1825  . umeclidinium-vilanterol (ANORO ELLIPTA) 62.5-25 MCG/INH 1 puff  1 puff Inhalation Daily Charlynne Cousins, MD   1 puff at 12/28/19 [phone]  . vitamin B-12 (CYANOCOBALAMIN) tablet 500 mcg  500 mcg Oral Daily Charlynne Cousins, MD   500 mcg at 12/28/19 [id number]     Discharge Medications: Please see discharge summary for a list of discharge medications.  Relevant Imaging Results:  Relevant Lab Results:   Additional Information ss # [id number]  Beverly Sessions, RN

## 2019-12-28 NOTE — Progress Notes (Signed)
Pt continues to pull at lines and tries to get out of bed. Tele sitter Press photographer and alarmed several times. Pt pulled out IV. Pt redirected and reoriented multiple times to no avail. NP Randol Kern contacted and orders placed for a 1 on 1 sitter.

## 2019-12-28 NOTE — Progress Notes (Signed)
Urology Inpatient Progress Note  Subjective: [name] is a 86 y.o. comorbid male admitted on 12/26/2019 with an obstructing 1 cm left proximal ureteral stone with partially duplicated collecting system as well as urosepsis.  He is POD 2 from cystoscopy and left ureteral stent placement with Dr. Diamantina Providence.   Urine culture negative, blood culture pending with no growth.  On antibiotics as below.    He is afebrile, VSS.  Condom cath in place on suction.  Urine is red clear.  Today, patient is disoriented.  He has a Actuary.  He is able to recall the placement of the stent and the need for future follow up which is surprising.  He denies flank and abdominal pain.  No acute concerns.  Anti-infectives: Anti-infectives (From admission, onward)   Start     Dose/Rate Route Frequency Ordered Stop   12/27/19 1000  cefTRIAXone (ROCEPHIN) 2 g in sodium chloride 0.9 % 100 mL IVPB     2 g 200 mL/hr over 30 Minutes Intravenous Every 24 hours 12/26/19 1842     12/26/19 1440  cefTRIAXone (ROCEPHIN) 1 g in sodium chloride 0.9 % 100 mL IVPB     1 g 200 mL/hr over 30 Minutes Intravenous 30 min pre-op 12/26/19 1440 12/26/19 1640      Current Facility-Administered Medications  Medication Dose Route Frequency Provider Last Rate Last Admin  . acetaminophen (TYLENOL) tablet 650 mg  650 mg Oral Q6H PRN Charlynne Cousins, MD       Or  . acetaminophen (TYLENOL) suppository 650 mg  650 mg Rectal Q6H PRN Charlynne Cousins, MD      . albuterol (PROVENTIL) (2.5 MG/3ML) 0.083% nebulizer solution 2.5 mg  2.5 mg Inhalation Q4H PRN Charlynne Cousins, MD      . apixaban [facility]) tablet 5 mg  5 mg Oral BID Charlynne Cousins, MD   5 mg at 12/28/19 0853  . cefTRIAXone (ROCEPHIN) 2 g in sodium chloride 0.9 % 100 mL IVPB  2 g Intravenous Q24H Charlynne Cousins, MD 200 mL/hr at 12/28/19 0850 2 g at 12/28/19 0850  . donepezil (ARICEPT) tablet 10 mg  10 mg Oral QHS Charlynne Cousins, MD   10 mg at 12/27/19  2003  . ezetimibe (ZETIA) tablet 10 mg  10 mg Oral Daily Charlynne Cousins, MD   10 mg at 12/28/19 0852  . finasteride (PROSCAR) tablet 5 mg  5 mg Oral Daily Charlynne Cousins, MD   5 mg at 12/28/19 [id number]  . haloperidol lactate (HALDOL) injection 1 mg  1 mg Intravenous Q6H PRN Loletha Grayer, MD      . hydrALAZINE (APRESOLINE) tablet 10 mg  10 mg Oral Q6H PRN Charlynne Cousins, MD      . magnesium oxide (MAG-OX) tablet 400 mg  400 mg Oral QPM Charlynne Cousins, MD   400 mg at 12/27/19 1825  . melatonin tablet 5 mg  5 mg Oral QHS Sharion Settler, NP      . metoprolol tartrate (LOPRESSOR) tablet 25 mg  25 mg Oral BID Charlynne Cousins, MD   25 mg at 12/28/19 0853  . mirabegron ER (MYRBETRIQ) tablet 50 mg  50 mg Oral Daily Charlynne Cousins, MD   50 mg at 12/28/19 0852  . nitroGLYCERIN (NITROSTAT) SL tablet 0.4 mg  0.4 mg Sublingual Q5 min PRN Charlynne Cousins, MD      . ondansetron [facility]) tablet 4 mg  4 mg Oral Q6H PRN  Charlynne Cousins, MD       Or  . ondansetron [facility]) injection 4 mg  4 mg Intravenous Q6H PRN Charlynne Cousins, MD      . pantoprazole (PROTONIX) EC tablet 40 mg  40 mg Oral Daily Charlynne Cousins, MD   40 mg at 12/28/19 0852  . QUEtiapine (SEROQUEL) tablet 25 mg  25 mg Oral QHS Wieting, Richard, MD      . rosuvastatin (CRESTOR) tablet 20 mg  20 mg Oral Daily Charlynne Cousins, MD   20 mg at 12/28/19 [phone]  . tamsulosin (FLOMAX) capsule 0.4 mg  0.4 mg Oral QPM Charlynne Cousins, MD   0.4 mg at 12/27/19 1825  . umeclidinium-vilanterol (ANORO ELLIPTA) 62.5-25 MCG/INH 1 puff  1 puff Inhalation Daily Charlynne Cousins, MD   1 puff at 12/28/19 [phone]  . vitamin B-12 (CYANOCOBALAMIN) tablet 500 mcg  500 mcg Oral Daily Charlynne Cousins, MD   500 mcg at 12/28/19 [id number]   Objective: Vital signs in last 24 hours: Temp:  [97.9 F (36.6 C)-98.6 F (37 C)] 98.1 F (36.7 C) (04/28 0846) Pulse Rate:  [66-82] 82 (04/28 0846) Resp:  [16-20] 20  (04/27 1931) BP: (116-154)/(69-91) 154/71 (04/28 0846) SpO2:  [92 %-96 %] 92 % (04/28 0846)  Intake/Output from previous day: 04/27 0701 - 04/28 0700 In: 340 [P.O.:240; IV Piggyback:100] Out: 750 [Urine:750] Intake/Output this shift: No intake/output data recorded.  Physical Exam Vitals and nursing note reviewed.  Constitutional:      General: He is not in acute distress.    Appearance: He is not ill-appearing, toxic-appearing or diaphoretic.  HENT:     Head: Normocephalic and atraumatic.  Pulmonary:     Effort: Pulmonary effort is normal. No respiratory distress.  Abdominal:     General: Abdomen is flat.     Palpations: Abdomen is soft.  Skin:    General: Skin is warm and dry.  Neurological:     Mental Status: He is alert. He is disoriented.  Psychiatric:     Comments: Restless, rolling around in bed.  Mitts in place.     Lab Results:  Recent Labs    12/26/19 2010 12/27/19 0520  WBC 15.8* 13.1*  HGB 12.9* 11.3*  HCT 38.9* 33.9*  PLT 123* 125*   BMET Recent Labs    12/26/19 2010 12/27/19 0520  NA 136 137  K 4.1 4.1  CL 105 107  CO2 24 20*  GLUCOSE 137* 137*  BUN 21 26*  CREATININE 1.33* 1.22  CALCIUM 8.6* 8.4*   Assessment & Plan: 86 year old comorbid male improving clinically following urgent left ureteral stent placement in the setting of an obstructing 1 cm proximal left ureteral stone with urosepsis.  Recommendations: -Okay for discharge today or tomorrow from urologic perspective, recommend Bactrim DS twice daily x10 days -We will arrange definitive stone management in 2 to 3 weeks: Left URS/LL/stent exchange  Zakhia Seres, PA-C 12/28/2019

## 2019-12-28 NOTE — Telephone Encounter (Signed)
He will need a follow up appointment in 2 to 3 weeks to discuss surgery for his stone.

## 2019-12-28 NOTE — TOC Initial Note (Addendum)
Transition of Care [facility]) - Initial/Assessment Note    Patient Details  Name: COLETON STAMP MRN: [id number] Date of Birth: 05-Aug-1934  Transition of Care [facility]) CM/SW Contact:    Beverly Sessions, RN Phone Number: 12/28/2019, 2:05 PM  Clinical Narrative:                 Patient admitted from home with sepsis Patient currently with delirium, and has safety sitter  Son at bedside Patient lives alone in his own home.  Patient's significant other comes over frequently Son lives 2.5 hours away  Patient has private pay care givers 5 days a week 4 hours a day.  Son states they have the financial means to increase services if needed.    PCP Dr Caryn Section - Caregiver takes patient to appointments  [facility] - denies issues obtaining medications  Patient has RW, cane, and WC in the home  PT has assessed patient and recommends SNF.  Son is in agreement to bed search.  Son states he feels like the patient would do better in his home environment with home health services.  CMS Medicare.gov Compare Post Acute Care list reviewed with son and provided copy to son.   Will follow up with son tomorrow   PASRR obtained Fl2 sent for signature Bed Search initiated   Expected Discharge Plan: [place] Barriers to Discharge: Continued Medical Work up   Patient Goals and CMS Choice        Expected Discharge Plan and Services Expected Discharge Plan: [place]   Discharge Planning Services: CM Consult   Living arrangements for the past 2 months: Single Family Home                                      Prior Living Arrangements/Services Living arrangements for the past 2 months: Single Family Home Lives with:: Self, Other (Comment) Patient language and need for interpreter reviewed:: Yes Do you feel safe going back to the place where you live?: Yes      Need for Family Participation in Patient Care: Yes (Comment) Care giver support  system in place?: Yes (comment) Current home services: DME, Sitter Criminal Activity/Legal Involvement Pertinent to Current Situation/Hospitalization: No - Comment as needed  Activities of Daily Living Home Assistive Devices/Equipment: Environmental consultant (specify type) ADL Screening (condition at time of admission) Patient's cognitive ability adequate to safely complete daily activities?: Yes Is the patient deaf or have difficulty hearing?: No Does the patient have difficulty seeing, even when wearing glasses/contacts?: No Does the patient have difficulty concentrating, remembering, or making decisions?: No Patient able to express need for assistance with ADLs?: Yes Does the patient have difficulty dressing or bathing?: No Independently performs ADLs?: No Communication: Independent Dressing (OT): Needs assistance Is this a change from baseline?: Pre-admission baseline Grooming: Independent Feeding: Independent Bathing: Needs assistance Is this a change from baseline?: Pre-admission baseline Toileting: Needs assistance Is this a change from baseline?: Pre-admission baseline In/Out Bed: Needs assistance Is this a change from baseline?: Pre-admission baseline Walks in Home: Independent Does the patient have difficulty walking or climbing stairs?: Yes Weakness of Legs: Both Weakness of Arms/Hands: Both  Permission Sought/Granted                  Emotional Assessment Appearance:: Appears older than stated age     Orientation: : Oriented to Self  Psych Involvement: No (comment)  Admission diagnosis:  Sepsis due to gram-negative UTI ([place]) [A41.50, N39.0] Patient Active Problem List   Diagnosis Date Noted  . Sepsis due to gram-negative UTI ([place]) 12/26/2019  . Left nephrolithiasis 12/26/2019  . Hydronephrosis, left 12/26/2019  . Status post abdominal aortic aneurysm (AAA) repair 04/15/2019  . Candidiasis 02/22/2019  . Malignant neoplasm of descending colon ([place])   . Benign neoplasm  of descending colon   . Benign neoplasm of transverse colon   . Do not intubate, cardiopulmonary resuscitation (CPR)-only code status 10/08/2018  . Prostate cancer ([place]) 02/25/2018  . Prostatic cyst 01/15/2018  . 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 ([place]) 09/15/2017  . Blood in stool   . Abnormal feces   . Stricture and stenosis of esophagus   . Mild cognitive impairment 08/18/2017  . Senile purpura ([place]) 04/29/2017  . Anemia 04/29/2017  . Frequent falls 04/29/2017  . Acute respiratory failure ([place]) 12/17/2016  . Simple chronic bronchitis ([place]) 12/17/2016  . Benign localized hyperplasia of prostate with urinary obstruction 07/15/2016  . Elevated PSA 07/15/2016  . Incomplete emptying of bladder 07/15/2016  . Urge incontinence 07/15/2016  . Hypothyroidism 04/25/2016  . Macular degeneration 04/25/2016  . Erectile dysfunction due to arterial insufficiency   . Ischemic heart disease with chronotropic incompetence   . CN (constipation) 02/10/2015  . DD (diverticular disease) 02/10/2015  . Fatigue 02/10/2015  . Lichen planus [id number]  . Restless leg 02/10/2015  . 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 B12 deficiency 02/10/2015  . COPD (chronic obstructive pulmonary disease) ([place]) 11/14/2014  . Post Inflammatory Lung Changes 11/14/2014  . PAF (paroxysmal atrial fibrillation) (HCC) CHA2DS2-VASc = 4. AC = Eliquis   . Insomnia 12/09/2013  . OSA on CPAP   . Dyspnea on exertion - -essentially resolved with BB dose reduction & wgt loss 03/29/2013  . Overweight (BMI 25.0-29.9) -- Wgt back up 01/17/2013  . Atherosclerotic heart disease of native coronary artery without angina pectoris - s/p CABG, occluded SVG-D1; patent LIMA-LAD, SVG-OM, SVG- RCA   . Essential hypertension   . Hyperlipidemia with target LDL less than 70   . Aorto-iliac disease ([place])   . BCC (basal cell carcinoma), eyelid 01/03/2013  . Allergic rhinitis 08/17/2009  . Adaptation reaction 05/28/2009  . Acid reflux 05/28/2009   . Arthritis, degenerative 05/28/2009  . 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 Carotid artery occlusion without infarction 06/01/2006  . PAD (peripheral artery disease) - bilateral common iliac stents 12/15/2005  . Atherosclerotic heart disease of artery bypass graft 09/01/1997   PCP:  [name], MD Pharmacy:   [place], [place] - [address] [place] [address] Phone: [phone] Fax: [phone]  [place], [place] - [place] [address] Phone: [phone] Fax: [phone]  [facility] [place], [place] to [facility] [place] [address] Phone: [phone] Fax: [phone]     Social Determinants of Health (SDOH) Interventions    Readmission Risk Interventions Readmission Risk Prevention Plan 12/28/2019 05/19/2019  Transportation Screening Complete Complete  HRI or [place] - Complete  Social Work Consult for [place] Planning/Counseling - Complete  Palliative Care Screening - Not Applicable  Medication Review Press photographer) Complete Complete  HRI or Home Care Consult Complete -  [place] Complete -  Some recent data might  be hidden

## 2019-12-28 NOTE — Progress Notes (Addendum)
Physical Therapy Treatment Patient Details Name: [name] MRN: [id number] DOB: 1934-07-18 Today's Date: 12/28/2019    History of Present Illness Pt admitted for ureteral stone and urosepsis. Initial complaints of L abdominal pain with N/V. Pt is now s/p cystoscopy and stent placement on 4/26. 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     PT Comments    Pt is HIGH F[place] risk at this time due to increased confusion compared to previous date. B hand mitts in place upon arrival. [place] in room throughout session in addition to family presence. Pt agreeable to therapy but thinks we are going out to dinner at [place]. Pt only oriented to self, impulsive and would require constant 24/7 supervision if he were to dc home. Pt needs mod assist for mobility efforts and would require +2 for ambulation in hallway due to safety concerns. As he is far removed from his baseline level, continue to recommend SNF. Will continue to progress as able.   Follow Up Recommendations  SNF     Equipment Recommendations  None recommended by PT    Recommendations for Other Services       Precautions / Restrictions Precautions Precautions: Fall Restrictions Weight Bearing Restrictions: No    Mobility  Bed Mobility Overal bed mobility: Needs Assistance Bed Mobility: Supine to Sit     Supine to sit: Mod assist     General bed mobility comments: needs assist for initiation of task including assist for trunk stability. Once seated tends to favor R side with leaning present. Able to scoot out towards EOB.  Transfers Overall transfer level: Needs assistance Equipment used: Rolling walker (2 wheeled) Transfers: Sit to/from Stand Sit to Stand: Mod assist         General transfer comment: Tries to pull up on RW despite cues for safety. ONce standing, upright posture noted  Ambulation/Gait Ambulation/Gait assistance: Min assist Gait Distance (Feet): 3  Feet Assistive device: Rolling walker (2 wheeled) Gait Pattern/deviations: Step-to pattern     General Gait Details: Due to incontience, only ambulated to recliner chair with external cath connected to wall suction. Pt unsteady, however no formal LOB noted. Would need 2 assist for further ambulation due to safety   Stairs             Wheelchair Mobility    Modified Rankin (Stroke Patients Only)       Balance Overall balance assessment: Needs assistance Sitting-balance support: Feet supported;Single extremity supported Sitting balance-Leahy Scale: Fair     Standing balance support: Bilateral upper extremity supported Standing balance-Leahy Scale: Fair                              Cognition Arousal/Alertness: Awake/alert Behavior During Therapy: Impulsive Overall Cognitive Status: Impaired/Different from baseline                                 General Comments: pt very confused, only alert to self. Has difficulty following commands.       Exercises Other Exercises Other Exercises: supine/seated ther-ex performed on B UE/LE including SLRs, hip abd/add, heel slides, hip add squeezes, alt marching, LAQ, and modified sit ups in recliner. All ther-ex performed x 12 reps with supervision. Mod cues for sequencing and attention to task    General Comments        Pertinent Vitals/Pain Pain  Assessment: Faces Faces Pain Scale: Hurts a little bit Pain Location: abdominal pain Pain Descriptors / Indicators: Cramping Pain Intervention(s): Limited activity within patient's tolerance;Repositioned    Home Living                      Prior Function            PT Goals (current goals can now be found in the care plan section) Acute Rehab PT Goals Patient Stated Goal: to go home PT Goal Formulation: With patient Time For Goal Achievement: 01/10/20 Potential to Achieve Goals: Good Progress towards PT goals: Progressing toward goals     Frequency    Min 2X/week      PT Plan Current plan remains appropriate    Co-evaluation              AM-PAC PT "6 Clicks" Mobility   Outcome Measure  Help needed turning from your back to your side while in a flat bed without using bedrails?: A Little Help needed moving from lying on your back to sitting on the side of a flat bed without using bedrails?: A Lot Help needed moving to and from a bed to a chair (including a wheelchair)?: A Lot Help needed standing up from a chair using your arms (e.g., wheelchair or bedside chair)?: A Lot Help needed to walk in hospital room?: A Lot Help needed climbing 3-5 steps with a railing? : Total 6 Click Score: 12    End of Session Equipment Utilized During Treatment: Gait belt Activity Tolerance: Patient tolerated treatment well Patient left: in chair;with chair alarm set;with nursing/sitter in room;with family/visitor present Nurse Communication: Mobility status PT Visit Diagnosis: Muscle weakness (generalized) (M62.81);Difficulty in walking, not elsewhere classified (R26.2);Unsteadiness on feet (R26.81)     Time: [id number] PT Time Calculation (min) (ACUTE ONLY): 27 min  Charges:  $Gait Training: 8-22 mins $Therapeutic Exercise: 8-22 mins                     Greggory Stallion, PT, DPT [phone]    [place] 12/28/2019, 4:38 PM

## 2019-12-28 NOTE — Progress Notes (Signed)
Patient ID: [name], male   DOB: 06-02-1934, 86 y.o.   MRN: [id number] [facility] PROGRESS NOTE  LOPAKA KALAJIAN [id number] DOB: 15-Jan-1934 DOA: 12/26/2019 PCP: [name], MD  HPI/Subjective: Patient states that there was a lot of people outside his house last night.  His wife went out there to see what was going on but did not tell him what was going on.  I had to orient him that he was in the hospital.  He states he slept but not too well.  Physically he feels okay but weak.  Objective: Vitals:   12/28/19 0846 12/28/19 1423  BP: (!) 154/71 (!) 151/83  Pulse: 82 77  Resp:  20  Temp: 98.1 F (36.7 C) 97.8 F (36.6 C)  SpO2: 92%     Intake/Output Summary (Last 24 hours) at 12/28/2019 1507 Last data filed at 12/28/2019 1300 Gross per 24 hour  Intake 340 ml  Output 1150 ml  Net -810 ml   Filed Weights   12/26/19 2059  Weight: 88.9 kg    ROS: Review of Systems  Constitutional: Positive for malaise/fatigue. Negative for fever.  Respiratory: Negative for cough and shortness of breath.   Cardiovascular: Negative for chest pain.  Gastrointestinal: Negative for abdominal pain, constipation, diarrhea, nausea and vomiting.  Genitourinary: Negative for dysuria.  Musculoskeletal: Negative for joint pain.  Neurological: Negative for dizziness and headaches.   Exam: Physical Exam  HENT:  Nose: No mucosal edema.  Mouth/Throat: No oropharyngeal exudate or posterior oropharyngeal edema.  Eyes: Conjunctivae and lids are normal.  Neck: Carotid bruit is not present.  Cardiovascular: S1 normal and S2 normal. Exam reveals no gallop.  No murmur heard. Respiratory: No respiratory distress. He has no wheezes. He has no rhonchi. He has no rales.  GI: Soft. Bowel sounds are normal. There is no abdominal tenderness.  Musculoskeletal:     Right ankle: No swelling.     Left ankle: No swelling.  Lymphadenopathy:    He has no cervical adenopathy.  Neurological: He is  alert. No cranial nerve deficit.  Skin: Skin is warm. No rash noted. Nails show no clubbing.  Psychiatric: He has a normal mood and affect.      Data Reviewed: Basic Metabolic Panel: Recent Labs  Lab 12/24/19 0734 12/24/19 2052 12/25/19 1409 12/26/19 2010 12/27/19 0520  NA 139 141 138 136 137  K 4.2 3.8 4.2 4.1 4.1  CL 109 108 105 105 107  CO2 23 22 25 24  20*  GLUCOSE 95 112* 120* 137* 137*  BUN 20 23 20 21  26*  CREATININE 0.89 1.09 1.17 1.33* 1.22  CALCIUM 8.9 9.5 9.3 8.6* 8.4*   Liver Function Tests: Recent Labs  Lab 12/24/19 0734 12/24/19 2052 12/25/19 1409  AST 17 18 19   ALT 12 12 13   ALKPHOS 88 99 88  BILITOT 1.0 1.0 1.9*  PROT 6.6 7.2 6.9  ALBUMIN 3.9 4.1 4.1   Recent Labs  Lab 12/24/19 2052 12/25/19 1409  LIPASE 23 17   CBC: Recent Labs  Lab 12/24/19 0734 12/24/19 2052 12/25/19 1409 12/26/19 2010 12/27/19 0520  WBC 7.8 9.4 14.0* 15.8* 13.1*  NEUTROABS  --   --   --  14.3*  --   HGB 12.2* 13.5 13.8 12.9* 11.3*  HCT 37.7* 41.1 41.0 38.9* 33.9*  MCV 94.7 93.8 90.7 92.8 93.1  PLT 133* 149* 141* 123* 125*    Recent Results (from the past 240 hour(s))  Respiratory Panel by RT PCR (  Flu A&B, Covid) - Nasopharyngeal Swab     Status: None   Collection Time: 12/26/19  2:32 PM   Specimen: Nasopharyngeal Swab  Result Value Ref Range Status   SARS Coronavirus 2 by RT PCR NEGATIVE NEGATIVE Final    Comment: (NOTE) SARS-CoV-2 target nucleic acids are NOT DETECTED. The SARS-CoV-2 RNA is generally detectable in upper respiratoy specimens during the acute phase of infection. The lowest concentration of SARS-CoV-2 viral copies this assay can detect is 131 copies/mL. A negative result does not preclude SARS-Cov-2 infection and should not be used as the sole basis for treatment or other patient management decisions. A negative result may occur with  improper specimen collection/handling, submission of specimen other than nasopharyngeal swab, presence of  viral mutation(s) within the areas targeted by this assay, and inadequate number of viral copies (<131 copies/mL). A negative result must be combined with clinical observations, patient history, and epidemiological information. The expected result is Negative. Fact Sheet for Patients:  PinkCheek.be Fact Sheet for Healthcare Providers:  GravelBags.it This test is not yet ap proved or cleared by the [place] FDA and  has been authorized for detection and/or diagnosis of SARS-CoV-2 by FDA under an Emergency Use Authorization (EUA). This EUA will remain  in effect (meaning this test can be used) for the duration of the COVID-19 declaration under Section 564(b)(1) of the Act, 21 U.S.C. section 360bbb-3(b)(1), unless the authorization is terminated or revoked sooner.    Influenza A by PCR NEGATIVE NEGATIVE Final   Influenza B by PCR NEGATIVE NEGATIVE Final    Comment: (NOTE) The Xpert Xpress SARS-CoV-2/FLU/RSV assay is intended as an aid in  the diagnosis of influenza from Nasopharyngeal swab specimens and  should not be used as a sole basis for treatment. Nasal washings and  aspirates are unacceptable for Xpert Xpress SARS-CoV-2/FLU/RSV  testing. Fact Sheet for Patients: PinkCheek.be Fact Sheet for Healthcare Providers: GravelBags.it This test is not yet approved or cleared by the [place] FDA and  has been authorized for detection and/or diagnosis of SARS-CoV-2 by  FDA under an Emergency Use Authorization (EUA). This EUA will remain  in effect (meaning this test can be used) for the duration of the  Covid-19 declaration under Section 564(b)(1) of the Act, 21  U.S.C. section 360bbb-3(b)(1), unless the authorization is  terminated or revoked. Performed at [facility], [address] [place], [place] [address]   Urine Culture     Status: None    Collection Time: 12/26/19  5:00 PM   Specimen: PATH Other; Urine  Result Value Ref Range Status   Specimen Description   Final    URINE, RANDOM Performed at [facility], [address] [place], [place] [address]    Special Requests   Final    NONE Performed at [facility], [address], [place], [place] [address]    Culture   Final    NO GROWTH Performed at [facility], [place] [address] [place],  [address]    Report Status 12/27/2019 FINAL  Final  Culture, blood (Routine X 2) w Reflex to ID Panel     Status: None (Preliminary result)   Collection Time: 12/26/19  8:10 PM   Specimen: BLOOD  Result Value Ref Range Status   Specimen Description BLOOD BLOOD LEFT HAND  Final   Special Requests   Final    BOTTLES DRAWN AEROBIC AND ANAEROBIC Blood Culture adequate volume   Culture   Final  NO GROWTH 2 DAYS Performed at [facility], [address] [place], [place] [address]    Report Status PENDING  Incomplete  Culture, blood (Routine X 2) w Reflex to ID Panel     Status: None (Preliminary result)   Collection Time: 12/26/19  8:10 PM   Specimen: BLOOD  Result Value Ref Range Status   Specimen Description BLOOD LEFT ANTECUBITAL  Final   Special Requests   Final    BOTTLES DRAWN AEROBIC AND ANAEROBIC Blood Culture adequate volume   Culture   Final    NO GROWTH 2 DAYS Performed at [facility], [address] [place], [place] [address]    Report Status PENDING  Incomplete     Scheduled Meds: . apixaban  5 mg Oral BID  . donepezil  10 mg Oral QHS  . ezetimibe  10 mg Oral Daily  . finasteride  5 mg Oral Daily  . magnesium oxide  400 mg Oral QPM  . melatonin  5 mg Oral QHS  . metoprolol tartrate  25 mg Oral BID  . mirabegron ER  50 mg Oral Daily  . pantoprazole  40 mg Oral Daily  . QUEtiapine  25 mg Oral QHS  . rosuvastatin  20 mg Oral Daily  . [START ON 12/29/2019] sulfamethoxazole-trimethoprim  1 tablet Oral Q12H   . tamsulosin  0.4 mg Oral QPM  . umeclidinium-vilanterol  1 puff Inhalation Daily  . vitamin B-12  500 mcg Oral Daily    Assessment/Plan:  1. Acute delirium.  I ordered Haldol as needed and Seroquel at night to get him sleeping.  Hopefully we will reset his time clock and he will be better tomorrow. 2. Weakness.  Currently physical therapy recommending rehab.  Family hopeful that he can go home with home health. 3. 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  Sepsis ruled out.  Urine culture negative.  Patient was on IV Rocephin and switched over to p.o. Bactrim. 4. Acute kidney injury.  Creatinine went up from 0.89 and has come down to 1.22.  It peaked at 1.33.  I discontinued anything that can make him more agitated at this point. 5. Paroxysmal atrial fibrillation and peripheral vascular disease on Eliquis and metoprolol 6. COPD.  Respiratory status stable continue inhalers  Code Status:     Code Status Orders  (From admission, onward)         Start     Ordered   12/26/19 1843  Full code  Continuous     12/26/19 1842        Code Status History    Date Active Date Inactive Code Status Order ID Comments User Context   05/18/2019 1214 05/19/2019 1749 Full Code [id number]  Algernon Huxley, MD Inpatient   01/13/2019 1210 01/13/2019 1559 Full Code [id number]  Hessie Knows, MD Inpatient   01/01/2017 1630 01/01/2017 2140 Full Code [id number]  Hessie Knows, MD Inpatient   03/19/2015 1553 03/19/2015 1943 Full Code [id number]  Hessie Knows, MD Inpatient   Advance Care Planning Activity    Advance Directive Documentation     Most Recent Value  Type of Advance Directive  Healthcare Power of Attorney, Living will  Pre-existing out of facility DNR order (yellow form or pink MOST form)  --  "MOST" Form in Place?  --     Family Communication: Wife at the bedside Disposition Plan: Acute delirium most clear.  Hoping he can  do better  with physical therapy in order to go home with home health but we will put out feelers to the rehabs just in case.  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  I am still hopeful that he will clear with his delirium and get stronger and be able to go home with home health.  This may take a day or 2 to clear.  Consultants:  Neurology  Procedures:  Left ureteral stent  Antibiotics:  Rocephin will be switched over to Bactrim DS tomorrow  Time spent: 28 minutes  [place]

## 2019-12-28 NOTE — Telephone Encounter (Signed)
Denny Peon will call him to schedule his surgery    Sharyn Lull

## 2019-12-29 ENCOUNTER — Telehealth: Payer: Self-pay | Admitting: Cardiology

## 2019-12-29 ENCOUNTER — Ambulatory Visit: Payer: Medicare Other

## 2019-12-29 DIAGNOSIS — I10 Essential (primary) hypertension: Secondary | ICD-10-CM

## 2019-12-29 DIAGNOSIS — N189 Chronic kidney disease, unspecified: Secondary | ICD-10-CM

## 2019-12-29 DIAGNOSIS — R319 Hematuria, unspecified: Secondary | ICD-10-CM

## 2019-12-29 LAB — BASIC METABOLIC PANEL
Anion gap: 6 (ref 5–15)
BUN: 28 mg/dL — ABNORMAL HIGH (ref 8–23)
CO2: 24 mmol/L (ref 22–32)
Calcium: 8.6 mg/dL — ABNORMAL LOW (ref 8.9–10.3)
Chloride: 110 mmol/L (ref 98–111)
Creatinine, Ser: 1.24 mg/dL (ref 0.61–1.24)
GFR calc Af Amer: >60 mL/min (ref >60)
GFR calc non Af Amer: 53 mL/min — ABNORMAL LOW (ref >60)
Glucose, Bld: 100 mg/dL — ABNORMAL HIGH (ref 70–99)
Potassium: 3.7 mmol/L (ref 3.5–5.1)
Sodium: 140 mmol/L (ref 135–145)

## 2019-12-29 LAB — CBC
HCT: 33.7 % — ABNORMAL LOW (ref 39.0–52.0)
Hemoglobin: 11.1 g/dL — ABNORMAL LOW (ref 13.0–17.0)
MCH: 30.4 pg (ref 26.0–34.0)
MCHC: 32.9 g/dL (ref 30.0–36.0)
MCV: 92.3 fL (ref 80.0–100.0)
Platelets: 145 10*3/uL — ABNORMAL LOW (ref 150–400)
RBC: 3.65 MIL/uL — ABNORMAL LOW (ref 4.22–5.81)
RDW: 14.9 % (ref 11.5–15.5)
WBC: 7.7 10*3/uL (ref 4.0–10.5)
nRBC: 0 % (ref 0.0–0.2)

## 2019-12-29 MED ORDER — MELATONIN 5 MG PO TABS: 5.0000 mg | ORAL_TABLET | Freq: Every day | ORAL | 0 refills | Status: DC

## 2019-12-29 NOTE — Progress Notes (Signed)
[name] A and O x4. VSS. Pt tolerating diet well. No complaints of nausea or vomiting. IV removed intact, prescriptions given. Pt significant other [name] voices understanding of discharge instructions. She wants to call doctor to ensure stopping lisinopril is ok for patient. Patient discharged via wheelchair with NT  Allergies as of 12/29/2019      Reactions   Clonazepam Other (See Comments)   Altered mental status   Ropinirole Swelling   Other reaction(s): Hallucination   Codeine Other (See Comments)   Altered mental status.   Niacin And Related Other (See Comments)   Flushing of skin   Tramadol    Other reaction(s): Hallucination   Hydrocodone    confusion   Oxycodone Other (See Comments)   confusion confusion      Medication List    STOP taking these medications   lisinopril 2.5 MG tablet Commonly known as: ZESTRIL   NON FORMULARY     TAKE these medications   acetaminophen 500 MG tablet Commonly known as: TYLENOL Take 500-1,000 mg by mouth every 6 (six) hours as needed for mild pain or moderate pain.   Anoro Ellipta 62.5-25 MCG/INH Aepb Generic drug: umeclidinium-vilanterol Inhale 1 puff into the lungs daily.   cephALEXin 250 MG capsule Commonly known as: KEFLEX Take 1 capsule (250 mg total) by mouth 4 (four) times daily for 10 days.   donepezil 10 MG tablet Commonly known as: ARICEPT Take 10 mg by mouth at bedtime.   Eliquis 5 MG Tabs tablet Generic drug: apixaban TAKE 1 TABLET BY MOUTH TWICE DAILY What changed: how much to take   ezetimibe 10 MG tablet Commonly known as: ZETIA Take 1 tablet (10 mg total) by mouth daily. What changed: when to take this   finasteride 5 MG tablet Commonly known as: PROSCAR Take 1 tablet (5 mg total) by mouth daily.   fluticasone 50 MCG/ACT nasal spray Commonly known as: FLONASE PLACE TWO SPRAYS IN BOTH NOSRTILS EVERY DAY AS NEEDED FOR ALLERGIES   Magnesium Oxide 500 MG Tabs Take 500 mg by mouth every  evening.   melatonin 5 MG Tabs Take 1 tablet (5 mg total) by mouth at bedtime.   metoprolol tartrate 25 MG tablet Commonly known as: LOPRESSOR Take 1 tablet (25 mg total) by mouth 2 (two) times daily. *OFFICE VISIT NEEDED FOR FURTHER REFILLS*   mirabegron ER 50 MG Tb24 tablet Commonly known as: MYRBETRIQ Take 1 tablet (50 mg total) by mouth daily. What changed: when to take this   nitroGLYCERIN 0.4 MG SL tablet Commonly known as: NITROSTAT Place 1 tablet (0.4 mg total) under the tongue every 5 (five) minutes as needed for chest pain.   ondansetron 4 MG disintegrating tablet Commonly known as: Zofran ODT Take 1 tablet (4 mg total) by mouth every 8 (eight) hours as needed for nausea or vomiting.   pantoprazole 40 MG tablet Commonly known as: PROTONIX TAKE ONE TABLET BY MOUTH EVERY DAY   PRESERVISION AREDS 2 PO Take 1 tablet by mouth 2 (two) times daily.   rosuvastatin 20 MG tablet Commonly known as: CRESTOR TAKE ONE TABLET BY MOUTH EVERY DAY What changed: when to take this   tamsulosin 0.4 MG Caps capsule Commonly known as: FLOMAX Take 1 capsule (0.4 mg total) by mouth every evening.   Vitamin B-12 500 MCG Subl Place 500 mcg under the tongue daily.   vitamin C 1000 MG tablet Take 1,000 mg by mouth daily.  Durable Medical Equipment  (From admission, onward)         Start     Ordered   12/29/19 1225  For home use only DME Walker rolling  Once    Question Answer Comment  Walker: With 5 Inch Wheels   Patient needs a walker to treat with the following condition Unsteady gait      12/29/19 1225          Vitals:   12/29/19 0431 12/29/19 0943  BP: 140/77 137/61  Pulse: 71 68  Resp: 18   Temp:    SpO2: 98%     Darnelle Catalan

## 2019-12-29 NOTE — Progress Notes (Signed)
Physical Therapy Treatment Patient Details Name: [name] MRN: [id number] DOB: August 29, 1934 Today's Date: 12/29/2019    History of Present Illness Pt admitted for ureteral stone and urosepsis. Initial complaints of L abdominal pain with N/V. Pt is now s/p cystoscopy and stent placement on 4/26. 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     PT Comments    Pt ready for session.  Mod a x 1 to get to EOB - Pt reaches for and pulls up on my arm despite cues for hand placements on bed.  Steady in sitting.  Stood with min a x 1.  He is able to progress gait 120' in hallway with min a x 1 at all times.  Mod a on occasion especially during turns for unsteadiness.  Remained in recliner after session.  Discussed discharge plan.  Pt wishes to return home.  Son is staying with him for a few days and his friend is able to help when he leaves.  Encouraged +1 assist at all times as he remains a high fall risk.  While SNF is still recommended as he is not at his baseline, pt and family wish to return home.  They are educated and voice education regarding safety and assist.   Follow Up Recommendations  SNF     Equipment Recommendations  None recommended by PT    Recommendations for Other Services       Precautions / Restrictions Precautions Precautions: Fall Restrictions Weight Bearing Restrictions: No    Mobility  Bed Mobility Overal bed mobility: Needs Assistance Bed Mobility: Supine to Sit     Supine to sit: Mod assist        Transfers Overall transfer level: Needs assistance Equipment used: Rolling walker (2 wheeled) Transfers: Sit to/from Stand Sit to Stand: Min assist            Ambulation/Gait Ambulation/Gait assistance: Min assist;Mod assist Gait Distance (Feet): 120 Feet Assistive device: Rolling walker (2 wheeled) Gait Pattern/deviations: Step-through pattern;Decreased step length - right;Decreased step length - left;Trunk  flexed Gait velocity: decreased   General Gait Details: Generally unsteady but able to progress gait 120' with RW.  Min a x 1 at all times with occasinal mod a x 1 to prevent falls.   Stairs             Wheelchair Mobility    Modified Rankin (Stroke Patients Only)       Balance Overall balance assessment: Needs assistance Sitting-balance support: Feet supported;Single extremity supported Sitting balance-Leahy Scale: Fair     Standing balance support: Bilateral upper extremity supported Standing balance-Leahy Scale: Fair                              Cognition Arousal/Alertness: Awake/alert Behavior During Therapy: WFL for tasks assessed/performed Overall Cognitive Status: Within Functional Limits for tasks assessed                                        Exercises      General Comments        Pertinent Vitals/Pain Pain Assessment: No/denies pain    Home Living                      Prior Function  PT Goals (current goals can now be found in the care plan section) Progress towards PT goals: Progressing toward goals    Frequency    Min 2X/week      PT Plan Current plan remains appropriate    Co-evaluation              AM-PAC PT "6 Clicks" Mobility   Outcome Measure    Help needed moving from lying on your back to sitting on the side of a flat bed without using bedrails?: A Lot Help needed moving to and from a bed to a chair (including a wheelchair)?: A Little Help needed standing up from a chair using your arms (e.g., wheelchair or bedside chair)?: A Lot Help needed to walk in hospital room?: A Lot Help needed climbing 3-5 steps with a railing? : A Lot 6 Click Score: 11    End of Session Equipment Utilized During Treatment: Gait belt Activity Tolerance: Patient tolerated treatment well Patient left: in chair;with chair alarm set;with family/visitor present Nurse Communication: Mobility  status       Time: 1131-1143 PT Time Calculation (min) (ACUTE ONLY): 12 min  Charges:  $Gait Training: 8-22 mins                    [name], PTA 12/29/19, 12:14 PM

## 2019-12-29 NOTE — Progress Notes (Signed)
Urology Inpatient Progress Note  Subjective: [name] is a 86 y.o. comorbid male admitted on 12/26/2019 with an obstructing 1 cm left proximal ureteral stone with partially duplicated collecting system as well as urosepsis.  He is POD 3 from cystoscopy and left ureteral stent placement with Dr. Diamantina Providence.  He developed significant confusion yesterday.  Creatinine stable today, 1.24.  WBC count normalized to 7.7 today.  Hemoglobin stabilizing at 11.1.  Blood cultures pending with no growth at 3 days.  Urine cultures resulted negative.  On antibiotics as below.  External urinary management system in place on suction draining red urine.  Patient reports some penile burning today but denies flank pain.  He is oriented to self only, however he has more insight into his disorientation than yesterday.  Per wife, patient seems to be clearer today than yesterday.  He still has a Actuary.  Anti-infectives: Anti-infectives (From admission, onward)   Start     Dose/Rate Route Frequency Ordered Stop   12/29/19 1000  sulfamethoxazole-trimethoprim (BACTRIM DS) 800-160 MG per tablet 1 tablet     1 tablet Oral Every 12 hours 12/28/19 1239 01/08/20 0959   12/27/19 1000  cefTRIAXone (ROCEPHIN) 2 g in sodium chloride 0.9 % 100 mL IVPB  Status:  Discontinued     2 g 200 mL/hr over 30 Minutes Intravenous Every 24 hours 12/26/19 1842 12/28/19 1239   12/26/19 1440  cefTRIAXone (ROCEPHIN) 1 g in sodium chloride 0.9 % 100 mL IVPB     1 g 200 mL/hr over 30 Minutes Intravenous 30 min pre-op 12/26/19 1440 12/26/19 1640      Current Facility-Administered Medications  Medication Dose Route Frequency Provider Last Rate Last Admin  . acetaminophen (TYLENOL) tablet 650 mg  650 mg Oral Q6H PRN Charlynne Cousins, MD       Or  . acetaminophen (TYLENOL) suppository 650 mg  650 mg Rectal Q6H PRN Charlynne Cousins, MD      . albuterol (PROVENTIL) (2.5 MG/3ML) 0.083% nebulizer solution 2.5 mg  2.5 mg Inhalation  Q4H PRN Charlynne Cousins, MD      . apixaban Arne Cleveland) tablet 5 mg  5 mg Oral BID Charlynne Cousins, MD   5 mg at 12/29/19 0943  . donepezil (ARICEPT) tablet 10 mg  10 mg Oral QHS Charlynne Cousins, MD   10 mg at 12/28/19 2146  . ezetimibe (ZETIA) tablet 10 mg  10 mg Oral Daily Charlynne Cousins, MD   10 mg at 12/29/19 0943  . finasteride (PROSCAR) tablet 5 mg  5 mg Oral Daily Charlynne Cousins, MD   5 mg at 12/29/19 0943  . haloperidol lactate (HALDOL) injection 1 mg  1 mg Intravenous Q6H PRN Loletha Grayer, MD      . hydrALAZINE (APRESOLINE) tablet 10 mg  10 mg Oral Q6H PRN Charlynne Cousins, MD      . magnesium oxide (MAG-OX) tablet 400 mg  400 mg Oral QPM Charlynne Cousins, MD   400 mg at 12/28/19 1836  . melatonin tablet 5 mg  5 mg Oral QHS Sharion Settler, NP   5 mg at 12/28/19 2146  . metoprolol tartrate (LOPRESSOR) tablet 25 mg  25 mg Oral BID Charlynne Cousins, MD   25 mg at 12/29/19 0943  . mirabegron ER (MYRBETRIQ) tablet 50 mg  50 mg Oral Daily Charlynne Cousins, MD   50 mg at 12/29/19 0943  . nitroGLYCERIN (NITROSTAT) SL tablet 0.4 mg  0.4 mg  Sublingual Q5 min PRN Charlynne Cousins, MD      . ondansetron [facility]) tablet 4 mg  4 mg Oral Q6H PRN Charlynne Cousins, MD       Or  . ondansetron [facility]) injection 4 mg  4 mg Intravenous Q6H PRN Charlynne Cousins, MD      . pantoprazole (PROTONIX) EC tablet 40 mg  40 mg Oral Daily Charlynne Cousins, MD   40 mg at 12/29/19 0943  . QUEtiapine (SEROQUEL) tablet 25 mg  25 mg Oral QHS Loletha Grayer, MD   25 mg at 12/28/19 2147  . rosuvastatin (CRESTOR) tablet 20 mg  20 mg Oral Daily Charlynne Cousins, MD   20 mg at 12/29/19 0943  . sulfamethoxazole-trimethoprim (BACTRIM DS) 800-160 MG per tablet 1 tablet  1 tablet Oral Q12H Loletha Grayer, MD   1 tablet at 12/29/19 0943  . tamsulosin (FLOMAX) capsule 0.4 mg  0.4 mg Oral QPM Charlynne Cousins, MD   0.4 mg at 12/28/19 1836  .  umeclidinium-vilanterol (ANORO ELLIPTA) 62.5-25 MCG/INH 1 puff  1 puff Inhalation Daily Charlynne Cousins, MD   1 puff at 12/29/19 0956  . vitamin B-12 (CYANOCOBALAMIN) tablet 500 mcg  500 mcg Oral Daily Charlynne Cousins, MD   500 mcg at 12/29/19 0943   Objective: Vital signs in last 24 hours: Temp:  [97.8 F (36.6 C)-98.3 F (36.8 C)] 98.3 F (36.8 C) (04/28 2014) Pulse Rate:  [63-77] 68 (04/29 0943) Resp:  [18-20] 18 (04/29 0431) BP: (125-151)/(61-83) 137/61 (04/29 0943) SpO2:  [94 %-98 %] 98 % (04/29 0431)  Intake/Output from previous day: 04/28 0701 - 04/29 0700 In: 240 [P.O.:240] Out: 1700 [Urine:1700] Intake/Output this shift: No intake/output data recorded.  Physical Exam Vitals and nursing note reviewed.  Constitutional:      General: He is not in acute distress.    Appearance: He is not ill-appearing, toxic-appearing or diaphoretic.  HENT:     Head: Normocephalic and atraumatic.  Pulmonary:     Effort: Pulmonary effort is normal. No respiratory distress.  Genitourinary:    Penis: Normal and circumcised. No erythema, discharge, swelling or lesions.   Skin:    General: Skin is warm and dry.  Neurological:     Mental Status: He is disoriented.  Psychiatric:        Mood and Affect: Mood normal.        Behavior: Behavior normal.    Lab Results:  Recent Labs    12/27/19 0520 12/29/19 1017  WBC 13.1* 7.7  HGB 11.3* 11.1*  HCT 33.9* 33.7*  PLT 125* 145*   BMET Recent Labs    12/27/19 0520 12/29/19 1017  NA 137 140  K 4.1 3.7  CL 107 110  CO2 20* 24  GLUCOSE 137* 100*  BUN 26* 28*  CREATININE 1.22 1.24  CALCIUM 8.4* 8.6*   Assessment & Plan: 86 year old comorbid male improving clinically following urgent left ureteral stent placement in the setting of an obstructing 1 cm proximal left ureteral stone with urosepsis.  No new recommendations today.  Continue recommendations as below.  Recommendations: -Okay to discharge today or tomorrow from  urologic perspective, recommend Bactrim DS twice daily x10 days -We will arrange definitive stone management in 2 to 3 weeks with left URS/LL/stent exchange  Debroah Loop, PA-C 12/29/2019

## 2019-12-29 NOTE — Telephone Encounter (Signed)
New Message  Pt c/o medication issue:  1. Name of Medication: lisinopril (ZESTRIL) 2.5 MG tablet  2. How are you currently taking this medication (dosage and times per day)? As directed  3. Are you having a reaction (difficulty breathing--STAT)? no  4. What is your medication issue? Patient has been instructed to stop Lisinopril while he was in the hospital. Patient wants to make sure that is ok to stop. Please give patient's wife [name] a call back to advise.

## 2019-12-29 NOTE — Discharge Summary (Signed)
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 AAA (abdominal aortic aneurysm) ([facility]) 05/2019   Relativley Rapid progression from June-Aug 2020 (4.6 - 5.4 cm): s/p EVAR (Dr. [facility]): EVAR - 23 mm prox, 12 cm distal & x 18 cm length Gore Excluder Endoprosthesis Main body (via RFA distal to lowest Renal A); 56mm x 14 cm L Contralateral Limb for L Iliac - extended with 8 mm x 6cm LifeStar stent (post-dilated with 7 mm DEB).  Additional R Iliac PTA not required.   . Arthritis   . Basal cell carcinoma of eyelid 01/03/2013   2019 - R side of Nose  . Benign neoplasm of ascending colon   . Benign neoplasm of descending colon   . Bilateral iliac artery stenosis ([place]) 2007   Bilateral Iliac A stenting; extension of EVAR limbs into both Iliacs -- additional stent placed in L Iliac.  . Cancer ([place])    Skin cancer  . Colon polyps   . Compression fracture of fourth lumbar vertebra ([place]) 04/2019   - s/p Kyphoplasty; T12, L3-5 [facility])  . COPD (chronic obstructive pulmonary disease) (H[facility])    Centrilobular Emphysema  . Coronary artery disease involving native coronary artery without angina pectoris 1998   - s/p CABG, occluded SVG-D1; patent LIMA-LAD, SVG-OM, SVG- RCA  . Dementia ([place])    memory loss - started noting late 2019  . Falls frequently   . GERD (gastroesophageal reflux disease)   . Hemorrhoid 02/10/2015  . History of hiatal  hernia   . History of kidney stones   . HOH (hard of hearing)   . Hypertension   . Osteoporosis   . PAF (paroxysmal atrial fibrillation) ([place])    on Eliquis for OAC - Rate Control   . Prostate cancer ([place])   . Prostate disorder   . Sciatic pain    Chronic  . Sleep apnea    cpap  . Temporary cerebral vascular dysfunction 02/10/2015   Had negative work-up.  July, 2012.  No medication changes, had forgot them that day, got dehydrated.   . Urinary incontinence     HOSPITAL COURSE:   1.  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  Sepsis was ruled out.  Urine culture and blood cultures were negative.  Patient was on IV Rocephin during the hospital course and switched over to p.o. Bactrim.  Family wanted to go back on the medication they already filled which is Keflex.  Follow-up with urology as outpatient for definitive stone treatment and stent removal. 2.  Hematuria secondary to being on Eliquis and procedure.  Patient's hemoglobin is stable on the day of discharge.  Case discussed with urology and they are okay with hematuria after the procedure secondary to being on Eliquis.  Follow-up hemoglobin as outpatient. 3.  Acute delirium.  I ordered Haldol and as needed Seroquel.  He had a good night sleep and his mental  status was much better on the day of discharge. 4.  Weakness.  Initially physical therapy recommended rehab.  Family and patient would like to go home.  The patient stood up for me without any assistance and walked back and forth to the door.  Discharge home with home health. 5.  Acute kidney injury.  Patient likely has some underlying chronic kidney disease stage IIIa.  Continue to monitor as outpatient. 6.  Paroxysmal atrial fibrillation peripheral vascular disease on Eliquis and metoprolol 7.  COPD.  Respiratory status stable  DISCHARGE CONDITIONS:   Satisfactory  CONSULTS OBTAINED:  Treatment  Team:  Billey Co, MD  DRUG ALLERGIES:   Allergies  Allergen Reactions  . Clonazepam Other (See Comments)    Altered mental status  . Ropinirole Swelling    Other reaction(s): Hallucination  . Codeine Other (See Comments)    Altered mental status.  . Niacin And Related Other (See Comments)    Flushing of skin  . Tramadol     Other reaction(s): Hallucination  . Hydrocodone     confusion  . Oxycodone Other (See Comments)    confusion confusion    DISCHARGE MEDICATIONS:   Allergies as of 12/29/2019      Reactions   Clonazepam Other (See Comments)   Altered mental status   Ropinirole Swelling   Other reaction(s): Hallucination   Codeine Other (See Comments)   Altered mental status.   Niacin And Related Other (See Comments)   Flushing of skin   Tramadol    Other reaction(s): Hallucination   Hydrocodone    confusion   Oxycodone Other (See Comments)   confusion confusion      Medication List    STOP taking these medications   lisinopril 2.5 MG tablet Commonly known as: ZESTRIL   NON FORMULARY     TAKE these medications   acetaminophen 500 MG tablet Commonly known as: TYLENOL Take 500-1,000 mg by mouth every 6 (six) hours as needed for mild pain or moderate pain.   Anoro Ellipta 62.5-25 MCG/INH Aepb Generic drug: umeclidinium-vilanterol Inhale 1 puff into the lungs daily.   cephALEXin 250 MG capsule Commonly known as: KEFLEX Take 1 capsule (250 mg total) by mouth 4 (four) times daily for 10 days.   donepezil 10 MG tablet Commonly known as: ARICEPT Take 10 mg by mouth at bedtime.   Eliquis 5 MG Tabs tablet Generic drug: apixaban TAKE 1 TABLET BY MOUTH TWICE DAILY What changed: how much to take   ezetimibe 10 MG tablet Commonly known as: ZETIA Take 1 tablet (10 mg total) by mouth daily. What changed: when to take this   finasteride 5 MG tablet Commonly known as: PROSCAR Take 1 tablet (5 mg total) by mouth daily.   fluticasone 50 MCG/ACT  nasal spray Commonly known as: FLONASE PLACE TWO SPRAYS IN BOTH NOSRTILS EVERY DAY AS NEEDED FOR ALLERGIES   Magnesium Oxide 500 MG Tabs Take 500 mg by mouth every evening.   melatonin 5 MG Tabs Take 1 tablet (5 mg total) by mouth at bedtime.   metoprolol tartrate 25 MG tablet Commonly known as: LOPRESSOR Take 1 tablet (25 mg total) by mouth 2 (two) times daily. *OFFICE VISIT NEEDED FOR FURTHER REFILLS*   mirabegron ER 50 MG Tb24 tablet Commonly known as: MYRBETRIQ Take 1 tablet (50 mg total) by mouth daily. What changed: when to take this   nitroGLYCERIN 0.4 MG SL tablet Commonly known as: NITROSTAT Place 1 tablet (0.4 mg total) under the  tongue every 5 (five) minutes as needed for chest pain.   ondansetron 4 MG disintegrating tablet Commonly known as: Zofran ODT Take 1 tablet (4 mg total) by mouth every 8 (eight) hours as needed for nausea or vomiting.   pantoprazole 40 MG tablet Commonly known as: PROTONIX TAKE ONE TABLET BY MOUTH EVERY DAY   PRESERVISION AREDS 2 PO Take 1 tablet by mouth 2 (two) times daily.   rosuvastatin 20 MG tablet Commonly known as: CRESTOR TAKE ONE TABLET BY MOUTH EVERY DAY What changed: when to take this   tamsulosin 0.4 MG Caps capsule Commonly known as: FLOMAX Take 1 capsule (0.4 mg total) by mouth every evening.   Vitamin B-12 500 MCG Subl Place 500 mcg under the tongue daily.   vitamin C 1000 MG tablet Take 1,000 mg by mouth daily.            Durable Medical Equipment  (From admission, onward)         Start     Ordered   12/29/19 1225  For home use only DME Walker rolling  Once    Question Answer Comment  Walker: With 5 Inch Wheels   Patient needs a walker to treat with the following condition Unsteady gait      12/29/19 1225           DISCHARGE INSTRUCTIONS:   Follow-up PMD 5 days Follow-up urology 2 weeks  If you experience worsening of your admission symptoms, develop shortness of breath, life threatening  emergency, suicidal or homicidal thoughts you must seek medical attention immediately by calling 911 or calling your MD immediately  if symptoms less severe.  You Must read complete instructions/literature along with all the possible adverse reactions/side effects for all the Medicines you take and that have been prescribed to you. Take any new Medicines after you have completely understood and accept all the possible adverse reactions/side effects.   Please note  You were cared for by a hospitalist during your hospital stay. If you have any questions about your discharge medications or the care you received while you were in the hospital after you are discharged, you can call the unit and asked to speak with the hospitalist on call if the hospitalist that took care of you is not available. Once you are discharged, your primary care physician will handle any further medical issues. Please note that NO REFILLS for any discharge medications will be authorized once you are discharged, as it is imperative that you return to your primary care physician (or establish a relationship with a primary care physician if you do not have one) for your aftercare needs so that they can reassess your need for medications and monitor your lab values.    Today   CHIEF COMPLAINT:  No chief complaint on file.   HISTORY OF PRESENT ILLNESS:  [name]  is a 86 y.o. male came in with left flank pain   VITAL SIGNS:  Blood pressure 137/61, pulse 68, temperature 98.3 F (36.8 C), temperature source Oral, resp. rate 18, height 5\' 11"  (1.803 m), weight 88.9 kg, SpO2 98 %.  I/O:    Intake/Output Summary (Last 24 hours) at 12/29/2019 1553 Last data filed at 12/29/2019 1332 Gross per 24 hour  Intake --  Output 1250 ml  Net -1250 ml    PHYSICAL EXAMINATION:  GENERAL:  86 y.o.-year-old patient lying in the bed with no acute distress.  EYES: Pupils equal, round, reactive to light and accommodation. HEENT:  Head atraumatic, normocephalic. Oropharynx and nasopharynx clear.  LUNGS: Normal breath sounds bilaterally, no wheezing, rales,rhonchi or crepitation. No use of accessory muscles of respiration.  CARDIOVASCULAR: S1, S2 normal. No murmurs, rubs, or gallops.  ABDOMEN: Soft, non-tender, non-distended. Bowel sounds present. No organomegaly or mass.  EXTREMITIES: No pedal edema, cyanosis, or clubbing.  NEUROLOGIC: Cranial nerves II through XII are intact. Muscle strength 5/5 in all extremities. Sensation intact. Gait not checked.  PSYCHIATRIC: The patient is alert and answers questions appropriately.  SKIN: Many bruises seen on the lower extremity  DATA REVIEW:   CBC Recent Labs  Lab 12/29/19 1017  WBC 7.7  HGB 11.1*  HCT 33.7*  PLT 145*    Chemistries  Recent Labs  Lab 12/25/19 1409 12/26/19 2010 12/29/19 1017  NA 138   < > 140  K 4.2   < > 3.7  CL 105   < > 110  CO2 25   < > 24  GLUCOSE 120*   < > 100*  BUN 20   < > 28*  CREATININE 1.17   < > 1.24  CALCIUM 9.3   < > 8.6*  AST 19  --   --   ALT 13  --   --   ALKPHOS 88  --   --   BILITOT 1.9*  --   --    < > = values in this interval not displayed.     Microbiology Results  Results for orders placed or performed during the hospital encounter of 12/26/19  Urine Culture     Status: None   Collection Time: 12/26/19  5:00 PM   Specimen: PATH Other; Urine  Result Value Ref Range Status   Specimen Description   Final    URINE, RANDOM Performed at [facility], [address], [place], [place] [address]    Special Requests   Final    NONE Performed at [facility], [address], [place], [place] [address]    Culture   Final    NO GROWTH Performed at [facility], [place] [address], [place], [place] [address]    Report Status 12/27/2019 FINAL  Final  Culture, blood (Routine X 2) w Reflex to ID Panel     Status: None (Preliminary result)   Collection Time: 12/26/19  8:10 PM   Specimen: BLOOD   Result Value Ref Range Status   Specimen Description BLOOD BLOOD LEFT HAND  Final   Special Requests   Final    BOTTLES DRAWN AEROBIC AND ANAEROBIC Blood Culture adequate volume   Culture   Final    NO GROWTH 3 DAYS Performed at [facility], [address] [place], [place] [address]    Report Status PENDING  Incomplete  Culture, blood (Routine X 2) w Reflex to ID Panel     Status: None (Preliminary result)   Collection Time: 12/26/19  8:10 PM   Specimen: BLOOD  Result Value Ref Range Status   Specimen Description BLOOD LEFT ANTECUBITAL  Final   Special Requests   Final    BOTTLES DRAWN AEROBIC AND ANAEROBIC Blood Culture adequate volume   Culture   Final    NO GROWTH 3 DAYS Performed at [facility], [address], [place], [place] [address]    Report Status PENDING  Incomplete     Management plans discussed with the patient, family and they are in agreement.  CODE STATUS:     Code Status Orders  (From  admission, onward)         Start     Ordered   12/26/19 1843  Full code  Continuous     12/26/19 1842        Code Status History    Date Active Date Inactive Code Status Order ID Comments User Context   05/18/2019 1214 05/19/2019 1749 Full Code [id number]  Algernon Huxley, MD Inpatient   01/13/2019 1210 01/13/2019 1559 Full Code [id number]  Hessie Knows, MD Inpatient   01/01/2017 1630 01/01/2017 2140 Full Code [id number]  Hessie Knows, MD Inpatient   03/19/2015 1553 03/19/2015 1943 Full Code [id number]  Hessie Knows, MD Inpatient   Advance Care Planning Activity    Advance Directive Documentation     Most Recent Value  Type of Advance Directive  Healthcare Power of Attorney, Living will  Pre-existing out of facility DNR order (yellow form or pink MOST form)  --  "MOST" Form in Place?  --      TOTAL TIME TAKING CARE OF THIS PATIENT: 35 minutes.    Loletha Grayer M.D on 12/29/2019 at 3:53 PM  Between 7am to 6pm - Pager - [phone]  After 6pm go  to [email or web address] - password [email or web address]  [facility]  CC: Primary care physician; [name], MD

## 2019-12-29 NOTE — Care Management Important Message (Signed)
Important Message  Patient Details  Name: [name] MRN: [id number] Date of Birth: 1934-02-22   Medicare Important Message Given:  Yes     Dannette Barbara 12/29/2019, 1:08 PM

## 2019-12-29 NOTE — Discharge Instructions (Signed)
Kidney Stones  Kidney stones are solid, rock-like deposits that form inside of the kidneys. The kidneys are a pair of organs that make urine. A kidney stone may form in a kidney and move into other parts of the urinary tract, including the tubes that connect the kidneys to the bladder (ureters), the bladder, and the tube that carries urine out of the body (urethra). As the stone moves through these areas, it can cause intense pain and block the flow of urine. Kidney stones are created when high levels of certain minerals are found in the urine. The stones are usually passed out of the body through urination, but in some cases, medical treatment may be needed to remove them. What are the causes? Kidney stones may be caused by:  A condition in which certain glands produce too much parathyroid hormone (primary hyperparathyroidism), which causes too much calcium buildup in the blood.  A buildup of uric acid crystals in the bladder (hyperuricosuria). Uric acid is a chemical that the body produces when you eat certain foods. It usually exits the body in the urine.  Narrowing (stricture) of one or both of the ureters.  A kidney blockage that is present at birth (congenital obstruction).  Past surgery on the kidney or the ureters, such as gastric bypass surgery. What increases the risk? The following factors may make you more likely to develop this condition:  Having had a kidney stone in the past.  Having a family history of kidney stones.  Not drinking enough water.  Eating a diet that is high in protein, salt (sodium), or sugar.  Being overweight or obese. What are the signs or symptoms? Symptoms of a kidney stone may include:  Pain in the side of the abdomen, right below the ribs (flank pain). Pain usually spreads (radiates) to the groin.  Needing to urinate frequently or urgently.  Painful urination.  Blood in the urine (hematuria).  Nausea.  Vomiting.  Fever and chills. How  is this diagnosed? This condition may be diagnosed based on:  Your symptoms and medical history.  A physical exam.  Blood tests.  Urine tests. These may be done before and after the stone passes out of your body through urination.  Imaging tests, such as a CT scan, abdominal X-ray, or ultrasound.  A procedure to examine the inside of the bladder (cystoscopy). How is this treated? Treatment for kidney stones depends on the size, location, and makeup of the stones. Kidney stones will often pass out of the body through urination. You may need to:  Increase your fluid intake to help pass the stone. In some cases, you may be given fluids through an IV and may need to be monitored at the hospital.  Take medicine for pain.  Make changes in your diet to help prevent kidney stones from coming back. Sometimes, medical procedures are needed to remove a kidney stone. This may involve:  A procedure to break up kidney stones using: ? A focused beam of light (laser therapy). ? Shock waves (extracorporeal shock wave lithotripsy).  Surgery to remove kidney stones. This may be needed if you have severe pain or have stones that block your urinary tract. Follow these instructions at home: Medicines  Take over-the-counter and prescription medicines only as told by your health care provider.  Ask your health care provider if the medicine prescribed to you requires you to avoid driving or using heavy machinery. Eating and drinking  Drink enough fluid to keep your urine pale yellow.   You may be instructed to drink at least 8-10 glasses of water each day. This will help you pass the kidney stone.  If directed, change your diet. This may include: ? Limiting how much sodium you eat. ? Eating more fruits and vegetables. ? Limiting how much animal protein--such as red meat, poultry, fish, and eggs--you eat.  Follow instructions from your health care provider about eating or drinking  restrictions. General instructions  Collect urine samples as told by your health care provider. You may need to collect a urine sample: ? 24 hours after you pass the stone. ? 8-12 weeks after passing the kidney stone, and every 6-12 months after that.  Strain your urine every time you urinate, for as long as directed. Use the strainer that your health care provider recommends.  Do not throw out the kidney stone after passing it. Keep the stone so it can be tested by your health care provider. Testing the makeup of your kidney stone may help prevent you from getting kidney stones in the future.  Keep all follow-up visits as told by your health care provider. This is important. You may need follow-up X-rays or ultrasounds to make sure that your stone has passed. How is this prevented? To prevent another kidney stone:  Drink enough fluid to keep your urine pale yellow. This is the best way to prevent kidney stones.  Eat a healthy diet and follow recommendations from your health care provider about foods to avoid. You may be instructed to eat a low-protein diet. Recommendations vary depending on the type of kidney stone that you have.  Maintain a healthy weight. Where to find more information  National Kidney Foundation (NKF): www.kidney.org  [employer] (UCF): www.urologyhealth.org Contact a health care provider if:  You have pain that gets worse or does not get better with medicine. Get help right away if:  You have a fever or chills.  You develop severe pain.  You develop new abdominal pain.  You faint.  You are unable to urinate. Summary  Kidney stones are solid, rock-like deposits that form inside of the kidneys.  Kidney stones can cause nausea, vomiting, blood in the urine, abdominal pain, and the urge to urinate frequently.  Treatment for kidney stones depends on the size, location, and makeup of the stones. Kidney stones will often pass out of the body  through urination.  Kidney stones can be prevented by drinking enough fluids, eating a healthy diet, and maintaining a healthy weight. This information is not intended to replace advice given to you by your health care provider. Make sure you discuss any questions you have with your health care provider. Document Revised: 01/04/2019 Document Reviewed: 01/04/2019 Elsevier Patient Education  2020 Elsevier Inc.  

## 2019-12-29 NOTE — Telephone Encounter (Signed)
Called patient wife- she states that patient was discharged from hospital and they stopped the lisinopril- she wanted to make sure this was okay to stop, and stop for good.   Patient is to have another procedure in the next few weeks to remove the kidney stone, so they were unsure if okay to stop it for good or not.   Advised I would route to MD to advise and call back. Patient wife thankful for call back.

## 2019-12-30 ENCOUNTER — Telehealth: Payer: Self-pay

## 2019-12-30 NOTE — Telephone Encounter (Signed)
He should go back on the lisinopril. They stopped because his kidney functions had decline a little bit, but his electrolytes were normal and kidney functions were  Back to normal by the time he was discharge, so he should stay on it.

## 2019-12-30 NOTE — Telephone Encounter (Addendum)
Can move his hospital follow up to the 3:40 slot on Monday May 10th.

## 2019-12-30 NOTE — Telephone Encounter (Signed)
Patient's wife [name] advised.

## 2019-12-30 NOTE — Telephone Encounter (Signed)
Patient's wife [name] advised. She wants to be sure it is safe for patient to stop taking the Lisinopril as directed at discharge. She also wants to know why the Lisinopril is being D/C. Please advise.

## 2019-12-30 NOTE — Telephone Encounter (Signed)
Transition Care Management Follow-up Telephone Call  Date of discharge and from where: 4/29 [facility]  How have you been since you were released from the hospital? [name]'s significant other, [name], who states patient is doing better today but seemed to have lingering effects from anesthesia including seeing things but is now more lucid. Currently resting.   Any questions or concerns? Yes He is having urinary frequency, wears depends but soiling them often. He does have urology follow up in 2-3 weeks. Still awaiting home physical therapy from [facility].     Items Reviewed:  Did the pt receive and understand the discharge instructions provided? Yes   Medications obtained and verified? Yes   Any new allergies since your discharge? No   Dietary orders reviewed? Yes  Do you have support at home? Yes   Functional Questionnaire: (I = Independent and D = Dependent) ADLs: I with assistance  Bathing/Dressing- D  Meal Prep- D  Eating- I  Maintaining continence- D  Transferring/Ambulation- I with assistance  Managing Meds- D  Follow up appointments reviewed:   PCP Hospital f/u appt confirmed? Yes  Scheduled to see Dr. Caryn Section on 01/20/20 @ 8:00.  [facility] f/u appt confirmed? No  awaiting appt  Are transportation arrangements needed? No   If their condition worsens, is the pt aware to call PCP or go to the Emergency Dept.? Yes  Was the patient provided with contact information for the PCP's office or ED? Yes  Was to pt encouraged to call back with questions or concerns? Yes

## 2019-12-31 LAB — CULTURE, BLOOD (ROUTINE X 2)
Culture: NO GROWTH
Culture: NO GROWTH
Special Requests: ADEQUATE
Special Requests: ADEQUATE

## 2020-01-01 ENCOUNTER — Other Ambulatory Visit: Payer: Self-pay

## 2020-01-01 ENCOUNTER — Ambulatory Visit: Payer: Medicare Other

## 2020-01-01 ENCOUNTER — Encounter: Payer: Self-pay | Admitting: Emergency Medicine

## 2020-01-01 ENCOUNTER — Ambulatory Visit
Admission: EM | Admit: 2020-01-01 | Discharge: 2020-01-01 | Disposition: A | Discharge status: Home / Self Care | Payer: Medicare Other | Attending: Emergency Medicine | Admitting: Emergency Medicine

## 2020-01-01 DIAGNOSIS — G4733 Obstructive sleep apnea (adult) (pediatric): Secondary | ICD-10-CM | POA: Diagnosis not present

## 2020-01-01 DIAGNOSIS — Z79899 Other long term (current) drug therapy: Secondary | ICD-10-CM | POA: Insufficient documentation

## 2020-01-01 DIAGNOSIS — S92355A Nondisplaced fracture of fifth metatarsal bone, left foot, initial encounter for closed fracture: Secondary | ICD-10-CM | POA: Diagnosis not present

## 2020-01-01 DIAGNOSIS — W19XXXA Unspecified fall, initial encounter: Secondary | ICD-10-CM | POA: Diagnosis not present

## 2020-01-01 DIAGNOSIS — M25572 Pain in left ankle and joints of left foot: Secondary | ICD-10-CM | POA: Insufficient documentation

## 2020-01-01 DIAGNOSIS — K219 Gastro-esophageal reflux disease without esophagitis: Secondary | ICD-10-CM | POA: Diagnosis not present

## 2020-01-01 DIAGNOSIS — I10 Essential (primary) hypertension: Secondary | ICD-10-CM | POA: Insufficient documentation

## 2020-01-01 DIAGNOSIS — E785 Hyperlipidemia, unspecified: Secondary | ICD-10-CM | POA: Diagnosis not present

## 2020-01-01 DIAGNOSIS — Z7901 Long term (current) use of anticoagulants: Secondary | ICD-10-CM | POA: Diagnosis not present

## 2020-01-01 DIAGNOSIS — J449 Chronic obstructive pulmonary disease, unspecified: Secondary | ICD-10-CM | POA: Diagnosis not present

## 2020-01-01 DIAGNOSIS — Z87891 Personal history of nicotine dependence: Secondary | ICD-10-CM | POA: Insufficient documentation

## 2020-01-01 DIAGNOSIS — M19072 Primary osteoarthritis, left ankle and foot: Secondary | ICD-10-CM | POA: Insufficient documentation

## 2020-01-01 NOTE — Discharge Instructions (Signed)
Rest. Ice. Keep in splint. Elevate. Use walker at home and avoid weightbearing.  Follow-up with orthopedic this week. See above to call to schedule.  Follow up with your primary care physician this week as needed. Return to Urgent care for new or worsening concerns.

## 2020-01-01 NOTE — ED Provider Notes (Signed)
[facility] ____________________________________________  Time seen: Approximately 10:38 AM  I have reviewed the triage vital signs and the nursing notes.   HISTORY  Chief Complaint Ankle Injury (DOI 01/01/20) and Fall  HPI [name] is a 86 y.o. male presenting with family at bedside for evaluation of left foot and ankle pain after injury.  Reports early this morning he got up to go to the restroom to urinate, and reports he put his foot on top of the toilet and then lost his balance and fell on his foot.  Denies head injury or loss of consciousness.  Denies any other injury from this fall.  States has been able to tolerate weightbearing but has not done a lot.  Previous ankle surgery to the same ankle.  Denies alleviating measures.  Reports otherwise doing well.   [name], MD : PCP   Past Medical History:  Diagnosis Date  . AAA (abdominal aortic aneurysm) ([place]) 05/2019   Relativley Rapid progression from June-Aug 2020 (4.6 - 5.4 cm): s/p EVAR (Dr. Lucky Cowboy [facility]): EVAR - 23 mm prox, 12 cm distal & x 18 cm length Gore Excluder Endoprosthesis Main body (via RFA distal to lowest Renal A); 62m x 14 cm L Contralateral Limb for L Iliac - extended with 8 mm x 6cm LifeStar stent (post-dilated with 7 mm DEB).  Additional R Iliac PTA not required.   . Arthritis   . Basal cell carcinoma of eyelid 01/03/2013   2019 - R side of Nose  . Benign neoplasm of ascending colon   . Benign neoplasm of descending colon   . Bilateral iliac artery stenosis ([place] 2007   Bilateral Iliac A stenting; extension of EVAR limbs into both Iliacs -- additional stent placed in L Iliac.  . Cancer ([facility]    Skin cancer  . Colon polyps   . Compression fracture of fourth lumbar vertebra ([place] 04/2019   - s/p Kyphoplasty; T12, L3-5 ([facility]  . COPD (chronic obstructive pulmonary disease) (HCC)    Centrilobular Emphysema  . Coronary artery disease involving native coronary artery without angina  pectoris 1998   - s/p CABG, occluded SVG-D1; patent LIMA-LAD, SVG-OM, SVG- RCA  . Dementia ([place]    memory loss - started noting late 2019  . Falls frequently   . GERD (gastroesophageal reflux disease)   . Hemorrhoid 02/10/2015  . History of hiatal hernia   . History of kidney stones   . HOH (hard of hearing)   . Hypertension   . Osteoporosis   . PAF (paroxysmal atrial fibrillation) ([place]    on Eliquis for OAC - Rate Control   . Prostate cancer ([place]   . Prostate disorder   . Sciatic pain    Chronic  . Sleep apnea    cpap  . Temporary cerebral vascular dysfunction 02/10/2015   Had negative work-up.  July, 2012.  No medication changes, had forgot them that day, got dehydrated.   . Urinary incontinence     Patient Active Problem List   Diagnosis Date Noted  . Hematuria   . Acute kidney injury superimposed on CKD ([place]   . Sepsis due to gram-negative UTI ([place] 12/26/2019  . Left nephrolithiasis 12/26/2019  . Hydronephrosis, left 12/26/2019  . Status post abdominal aortic aneurysm (AAA) repair 04/15/2019  . Candidiasis 02/22/2019  . Malignant neoplasm of descending colon ([place]   . Benign neoplasm of descending colon   . Benign neoplasm of transverse colon   . Do not  intubate, cardiopulmonary resuscitation (CPR)-only code status 10/08/2018  . Prostate cancer ([place]) 02/25/2018  . Prostatic cyst 01/15/2018  . 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 ([place]) 09/15/2017  . Blood in stool   . Abnormal feces   . Stricture and stenosis of esophagus   . Mild cognitive impairment 08/18/2017  . Senile purpura ([place]) 04/29/2017  . Anemia 04/29/2017  . Frequent falls 04/29/2017  . Acute respiratory failure ([place]) 12/17/2016  . Simple chronic bronchitis ([place]) 12/17/2016  . Benign localized hyperplasia of prostate with urinary obstruction 07/15/2016  . Elevated PSA 07/15/2016  . Incomplete emptying of bladder 07/15/2016  . Urge incontinence 07/15/2016  . Hypothyroidism 04/25/2016  . Macular  degeneration 04/25/2016  . Erectile dysfunction due to arterial insufficiency   . Ischemic heart disease with chronotropic incompetence   . CN (constipation) 02/10/2015  . DD (diverticular disease) 02/10/2015  . Weakness 02/10/2015  . Lichen planus 76/72/0947  . Restless leg 02/10/2015  . 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 B12 deficiency 02/10/2015  . COPD (chronic obstructive pulmonary disease) ([place]) 11/14/2014  . Post Inflammatory Lung Changes 11/14/2014  . Acute delirium 11/08/2014  . PAF (paroxysmal atrial fibrillation) (HCC) CHA2DS2-VASc = 4. AC = Eliquis   . Insomnia 12/09/2013  . OSA on CPAP   . Dyspnea on exertion - -essentially resolved with BB dose reduction & wgt loss 03/29/2013  . Overweight (BMI 25.0-29.9) -- Wgt back up 01/17/2013  . Atherosclerotic heart disease of native coronary artery without angina pectoris - s/p CABG, occluded SVG-D1; patent LIMA-LAD, SVG-OM, SVG- RCA   . Essential hypertension   . Hyperlipidemia with target LDL less than 70   . Aorto-iliac disease ([place])   . BCC (basal cell carcinoma), eyelid 01/03/2013  . Allergic rhinitis 08/17/2009  . Adaptation reaction 05/28/2009  . Acid reflux 05/28/2009  . Arthritis, degenerative 05/28/2009  . 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 Carotid artery occlusion without infarction 06/01/2006  . PAD (peripheral artery disease) - bilateral common iliac stents 12/15/2005  . Atherosclerotic heart disease of artery bypass graft 09/01/1997    Past Surgical History:  Procedure Laterality Date  . ABDOMINAL AORTIC ENDOVASCULAR STENT GRAFT Bilateral 05/18/2019   Procedure: ABDOMINAL AORTIC ENDOVASCULAR STENT GRAFT;  Surgeon: Algernon Huxley, MD;  Location: [facility] ORS;  Service: Vascular;  Laterality: Bilateral;  . ANKLE SURGERY    . Arch aortogram and carotid aortogram  06/02/2006   Dr Oneida Alar did surgery  . BASAL CELL CARCINOMA EXCISION  04/2016   Dermatology  . CARDIAC CATHETERIZATION  01/30/1998    (Dr.  Linard Millers): Native LAD & mRCA CTO. 90% D1. Mod Cx. SVG-D1 CTO. SVG-RCA & SVG-OM along with LIMA-LAD patent;  LV NORMAL Fxn  . CAROTID ENDARTERECTOMY Left Oct. 29, 2007   Dr. Oneida Alar:  . CATARACT EXTRACTION W/PHACO Right 03/05/2017   Procedure: CATARACT EXTRACTION PHACO AND INTRAOCULAR LENS PLACEMENT (IOC);  Surgeon: Leandrew Koyanagi, MD;  Location: [facility] ORS;  Service: Ophthalmology;  Laterality: Right;  [place] 00:58.5 AP% 10.6 CDE 6.20 Fluid Pack lot # [id number]  . CATARACT EXTRACTION W/PHACO Left 04/15/2017   Procedure: CATARACT EXTRACTION PHACO AND INTRAOCULAR LENS PLACEMENT ([place])  Left;  Surgeon: Leandrew Koyanagi, MD;  Location: [place];  Service: Ophthalmology;  Laterality: Left;  . COLONOSCOPY WITH PROPOFOL N/A 09/15/2017   Procedure: COLONOSCOPY WITH PROPOFOL;  Surgeon: Lucilla Lame, MD;  [facility];  Service: Endoscopy;  Laterality: N/A;  . COLONOSCOPY WITH PROPOFOL N/A 10/26/2018   Procedure: COLONOSCOPY WITH PROPOFOL;  Surgeon: Allen Norris,  Darren, MD;  Location: [place] ENDOSCOPY;  Service: Endoscopy;  Laterality: N/A;  . CORONARY ARTERY BYPASS GRAFT  1998   LIMA-LAD, SVG-OM, SVG-RPDA, SVG-DIAG  . CPET / MET  12/2012   Mild chronotropic incompetence - read 82% of predicted; also reduced effort; peak VO2 15.7 / 75% (did not reach Max effort) -- suggested ischemic response in last 1.5 minutes of exercise. Normal pulmonary function on PFTs but poor response to  . CYSTOSCOPY WITH STENT PLACEMENT Left 12/26/2019   Procedure: CYSTOSCOPY WITH STENT PLACEMENT;  Surgeon: Billey Co, MD;  Location: [facility];  Service: Urology;  Laterality: Left;  . ENDOVASCULAR REPAIR/STENT GRAFT N/A 05/18/2019   Procedure: ENDOVASCULAR REPAIR/STENT GRAFT;  Surgeon: Algernon Huxley, MD;  Location: [facility] INVASIVE CV LAB;; EVAR - 23 mm prox, 12 cm distal & x 18 cm length Gore Excluder Endoprosthesis Main body (via RFA distal to lowest Renal A); 50m x 14 cm L Contralateral Limb for L Iliac - extended  with 8 mm x 6cm LifeStar stent; no additional R Iliac PTA needed.   . ESOPHAGOGASTRODUODENOSCOPY (EGD) WITH PROPOFOL N/A 09/15/2017   Procedure: ESOPHAGOGASTRODUODENOSCOPY (EGD) WITH PROPOFOL;  Surgeon: WLucilla Lame MD;  [facility];  Service: Endoscopy;  Laterality: N/A;  . FRACTURE SURGERY Right 03/19/2015   wrist  . HIATAL HERNIA REPAIR    . ILIAC ARTERY STENT  12/15/2005   PTA and direct stenting rgt and lft common iliac arteries  . KYPHOPLASTY N/A 04/05/2019   Procedure: KYPHOPLASTY T12, L3, L4 L5;  Surgeon: MHessie Knows MD;  Location: [facility];  Service: Orthopedics;  Laterality: N/A;  . NM GATED MYOVIEW ([place])  05/'19; 8/'20   a) [facility]: Low Risk - no ischemia or infarct.  EF > 65%. no RWMA;; b) ([facility]: Normal wall motion.  No ischemia or infarction.  . OPEN REDUCTION INTERNAL FIXATION (ORIF) DISTAL RADIAL FRACTURE Left 01/13/2019   Procedure: OPEN REDUCTION INTERNAL FIXATION (ORIF) DISTAL  RADIAL FRACTURE - LEFT - SLEEP APNEA;  Surgeon: MHessie Knows MD;  Location: [facility];  Service: Orthopedics;  Laterality: Left;  . ORIF ELBOW FRACTURE Right 01/01/2017   Procedure: OPEN REDUCTION INTERNAL FIXATION (ORIF) ELBOW/OLECRANON FRACTURE;  Surgeon: MHessie Knows MD;  Location: [facility];  Service: Orthopedics;  Laterality: Right;  . ORIF WRIST FRACTURE Right 03/19/2015   Procedure: OPEN REDUCTION INTERNAL FIXATION (ORIF) WRIST FRACTURE;  Surgeon: MHessie Knows MD;  Location: [facility];  Service: Orthopedics;  Laterality: Right;  . THORACIC AORTA - CAROTID ANGIOGRAM  October 2007   Dr. 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 TRANSTHORACIC ECHOCARDIOGRAM  February 2016   [facility]: Normal LV function. Dilated left atrium.  .Domingo DimesECHOCARDIOGRAM  04/2019   [facility] EF 55-60 %.  Mild LVH.  Relatively normal valves.     No current facility-administered  medications for this encounter.  Current Outpatient Medications:  .  acetaminophen (TYLENOL) 500 MG tablet, Take 500-1,000 mg by mouth every 6 (six) hours as needed for mild pain or moderate pain. , Disp: , Rfl:  .  Ascorbic Acid (VITAMIN C) 1000 MG tablet, Take 1,000 mg by mouth daily., Disp: , Rfl:  .  cephALEXin (KEFLEX) 250 MG capsule, Take 1 capsule (250 mg total) by mouth 4 (four) times daily for 10 days., Disp: 40 capsule, Rfl: 0 .  Cyanocobalamin (VITAMIN B-12) 500 MCG SUBL, Place 500 mcg under the tongue daily. , Disp: ,  Rfl:  .  donepezil (ARICEPT) 10 MG tablet, Take 10 mg by mouth at bedtime. , Disp: , Rfl:  .  ELIQUIS 5 MG TABS tablet, TAKE 1 TABLET BY MOUTH TWICE DAILY (Patient taking differently: Take 5 mg by mouth 2 (two) times daily. ), Disp: 180 tablet, Rfl: 1 .  ezetimibe (ZETIA) 10 MG tablet, Take 1 tablet (10 mg total) by mouth daily. (Patient taking differently: Take 10 mg by mouth at bedtime. ), Disp: 90 tablet, Rfl: 4 .  finasteride (PROSCAR) 5 MG tablet, Take 1 tablet (5 mg total) by mouth daily., Disp: 90 tablet, Rfl: 3 .  fluticasone (FLONASE) 50 MCG/ACT nasal spray, PLACE TWO SPRAYS IN BOTH NOSRTILS EVERY DAY AS NEEDED FOR ALLERGIES, Disp: 16 g, Rfl: 4 .  Magnesium Oxide 500 MG TABS, Take 500 mg by mouth every evening., Disp: , Rfl:  .  metoprolol tartrate (LOPRESSOR) 25 MG tablet, Take 1 tablet (25 mg total) by mouth 2 (two) times daily. *OFFICE VISIT NEEDED FOR FURTHER REFILLS*, Disp: 180 tablet, Rfl: 0 .  mirabegron ER (MYRBETRIQ) 50 MG TB24 tablet, Take 1 tablet (50 mg total) by mouth daily. (Patient taking differently: Take 50 mg by mouth every evening. ), Disp: 30 tablet, Rfl: 11 .  Multiple Vitamins-Minerals (PRESERVISION AREDS 2 PO), Take 1 tablet by mouth 2 (two) times daily. , Disp: , Rfl:  .  nitroGLYCERIN (NITROSTAT) 0.4 MG SL tablet, Place 1 tablet (0.4 mg total) under the tongue every 5 (five) minutes as needed for chest pain., Disp: 20 tablet, Rfl: 1 .   pantoprazole (PROTONIX) 40 MG tablet, TAKE ONE TABLET BY MOUTH EVERY DAY, Disp: 90 tablet, Rfl: 1 .  rosuvastatin (CRESTOR) 20 MG tablet, TAKE ONE TABLET BY MOUTH EVERY DAY (Patient taking differently: Take 20 mg by mouth every evening. ), Disp: 90 tablet, Rfl: 4 .  tamsulosin (FLOMAX) 0.4 MG CAPS capsule, Take 1 capsule (0.4 mg total) by mouth every evening., Disp: 90 capsule, Rfl: 3 .  umeclidinium-vilanterol (ANORO ELLIPTA) 62.5-25 MCG/INH AEPB, Inhale 1 puff into the lungs daily. , Disp: , Rfl:  .  melatonin 5 MG TABS, Take 1 tablet (5 mg total) by mouth at bedtime., Disp: 30 tablet, Rfl: 0 .  ondansetron (ZOFRAN ODT) 4 MG disintegrating tablet, Take 1 tablet (4 mg total) by mouth every 8 (eight) hours as needed for nausea or vomiting., Disp: 20 tablet, Rfl: 0  Allergies Clonazepam, Ropinirole, Codeine, Niacin and related, Tramadol, Hydrocodone, and Oxycodone  Family History  Problem Relation Age of Onset  . Ulcers Mother        Peptic  . Dementia Mother   . Alcohol abuse Father     Social History Social History   Tobacco Use  . Smoking status: Former Smoker    Types: Cigarettes    Quit date: 08/04/1997    Years since quitting: 22.4  . Smokeless tobacco: Never Used  Substance Use Topics  . Alcohol use: No  . Drug use: No    Review of Systems Constitutional: No fever Eyes: No visual changes. Musculoskeletal: Positive left foot and ankle pain. Skin: Negative for rash. Neurological: Negative for focal weakness or numbness.    ____________________________________________   PHYSICAL EXAM:  VITAL SIGNS: ED Triage Vitals  Enc Vitals Group     BP 01/01/20 0904 (!) 133/105     Pulse Rate 01/01/20 0904 82     Resp 01/01/20 0904 18     Temp 01/01/20 0904 98 F (36.7 C)  Temp Source 01/01/20 0904 Oral     SpO2 01/01/20 0904 99 %     Weight 01/01/20 0904 192 lb (87.1 kg)     Height 01/01/20 0904 5' 10.5" (1.791 m)     Head Circumference --      Peak Flow --       Pain Score 01/01/20 0903 6     Pain Loc --      Pain Edu? --      Excl. in [place]? --     Constitutional: Alert and oriented. Well appearing and in no acute distress. Eyes: Conjunctivae are normal. ENT      Head: Normocephalic and atraumatic. Cardiovascular: Normal rate, regular rhythm. Good peripheral circulation. Respiratory: Normal respiratory effort without tachypnea nor retractions. Musculoskeletal: Bilateral pedal pulses equal and easily palpated.  Gait not tested due to pain. Except: Left medial ankle mild localized swelling with mild malleolus tenderness, no lateral malleolus tenderness.  Moderate tenderness to mid to proximal fifth metatarsal with mild localized swelling, left foot otherwise nontender, left lower extremity otherwise nontender, normal to sensation and capillary refill.  Neurologic:  Normal speech and language. Speech is normal. Skin:  Skin is warm, dry.  Psychiatric: Mood and affect are normal. Speech and behavior are normal. Patient exhibits appropriate insight and judgment   ___________________________________________   LABS (all labs ordered are listed, but only abnormal results are displayed)  Labs Reviewed - No data to display ____________________________________________  RADIOLOGY  DG Ankle Complete Left  Result Date: 01/01/2020 CLINICAL DATA:  LEFT ankle pain and swelling post fall this morning, pain mostly inferiorly and posteriorly EXAM: LEFT ANKLE COMPLETE - 3+ VIEW COMPARISON:  11/14/2008 FINDINGS: Interval removal of tibiotalar fusion hardware. Marked joint space narrowing, irregularity and sclerosis of the ankle joint with question partial ankylosis. Spurring at anterior margin of distal tibia. Remaining joint spaces preserved. No acute fracture, dislocation, or bone destruction. IMPRESSION: Degenerative and postsurgical changes of the LEFT ankle. No acute abnormalities. Electronically Signed   By: Lavonia Dana M.D.   On: 01/01/2020 09:40   DG Foot  Complete Left  Result Date: 01/01/2020 CLINICAL DATA:  Lateral left foot and ankle pain after fall this morning EXAM: LEFT FOOT - COMPLETE 3+ VIEW COMPARISON:  None. FINDINGS: Comminuted nondisplaced non articular fracture throughout the shaft of the left fifth metatarsal with surrounding soft tissue swelling. No additional fracture. No dislocation. No suspicious focal osseous lesions. Surgical clip medial to the distal left tibia. Osteoarthritis in the left ankle. IMPRESSION: 1. Comminuted nondisplaced non articular left fifth metatarsal shaft fracture with surrounding soft tissue swelling. No dislocation. 2. Osteoarthritis in the left ankle. Electronically Signed   By: Ilona Sorrel M.D.   On: 01/01/2020 10:51   ____________________________________________   PROCEDURES Procedures   Left foot posterior OCL splint applied by nursing staff.  INITIAL IMPRESSION / ASSESSMENT AND PLAN / ED COURSE  Pertinent labs & imaging results that were available during my care of the patient were reviewed by me and considered in my medical decision making (see chart for details).  Well-appearing patient.  Family at bedside.  Left foot and ankle injury after fall this morning.  Denies other injuries.  Left ankle x-ray as above per radiologist, negative for acute changes.  Left foot x-ray as above, initially read by myself with nondisplaced comminuted fracture of the fifth metatarsal shaft.  Splinting applied.  Reports he has a walker at home and they will plan to get a wheelchair for their own use.  Ice, elevation, Tylenol, follow-up with orthopedic this week.  Previous patient of [facility].  Discussed follow up and return parameters including no resolution or any worsening concerns. Patient verbalized understanding and agreed to plan.   ____________________________________________   FINAL CLINICAL IMPRESSION(S) / ED DIAGNOSES  Final diagnoses:  Nondisplaced fracture of fifth metatarsal bone, left foot, initial  encounter for closed fracture  Fall, initial encounter     ED Discharge Orders    None       Note: This dictation was prepared with Dragon dictation along with smaller phrase technology. Any transcriptional errors that result from this process are unintentional.         Marylene Land, NP 01/01/20 1106

## 2020-01-01 NOTE — Telephone Encounter (Signed)
The lisinopril was stopped because he had some acute renal failure secondary to nephrolithiasis.   His kidney function has stabilized some. What I think we probably should do is just have him reduce his lisinopril to 1 dose daily as opposed to twice a day.  -With once daily dosing would recommend taking it at roughly lunchtime or at least after breakfast.  [name], MD

## 2020-01-01 NOTE — ED Triage Notes (Signed)
Patient in today c/o left ankle/foot injury that occurred this morning. Patient states he put his foot up on the toilet and lost his balance, fell and injured his left ankle

## 2020-01-02 ENCOUNTER — Other Ambulatory Visit: Payer: Self-pay | Admitting: *Deleted

## 2020-01-02 ENCOUNTER — Other Ambulatory Visit: Payer: Self-pay | Admitting: Urology

## 2020-01-02 DIAGNOSIS — N201 Calculus of ureter: Secondary | ICD-10-CM

## 2020-01-02 MED ORDER — LISINOPRIL 2.5 MG PO TABS
2.5000 mg | ORAL_TABLET | Freq: Every day | ORAL | Status: DC
Start: 2020-01-02 — End: 2020-10-05

## 2020-01-02 NOTE — Telephone Encounter (Signed)
RN spoke to [name], direction given . Change to Lisinopril 2.5 mg daily .  medication added back to medication list.  [name] states she called primary while she was waiting for  Cardiology reply. Per [name], she states primary instructed her to take lisinopril as previous directed  Twice a day ,  But since she has heard from Dr Ellyn Hack she will give  Lisinopril once a day as he directed

## 2020-01-03 ENCOUNTER — Other Ambulatory Visit: Payer: Self-pay | Admitting: Urology

## 2020-01-03 ENCOUNTER — Other Ambulatory Visit: Payer: Medicare Other

## 2020-01-03 ENCOUNTER — Ambulatory Visit: Payer: Medicare Other

## 2020-01-03 ENCOUNTER — Other Ambulatory Visit: Payer: Self-pay

## 2020-01-03 DIAGNOSIS — N201 Calculus of ureter: Secondary | ICD-10-CM

## 2020-01-05 ENCOUNTER — Ambulatory Visit (INDEPENDENT_AMBULATORY_CARE_PROVIDER_SITE_OTHER): Payer: Medicare Other | Admitting: Vascular Surgery

## 2020-01-05 ENCOUNTER — Other Ambulatory Visit (INDEPENDENT_AMBULATORY_CARE_PROVIDER_SITE_OTHER): Payer: Medicare Other

## 2020-01-05 LAB — CULTURE, URINE COMPREHENSIVE

## 2020-01-06 NOTE — Progress Notes (Signed)
Established patient visit   Patient: [name]   DOB: 09/04/1933   86 y.o. Male  MRN: [id number] Visit Date: 01/09/2020  Today's healthcare provider: Lelon Huh, MD   Chief Complaint  Patient presents with  . Follow-up   Subjective    HPI  Follow up ER visit/ Hospital follow up      Patient was seen at an Urgent Care for a fall and Nondisplaced fracture of fifth metatarsal bone, left foot on 01/01/2020. He was previously admitted on 12/26/2019 with AKI and kidney stone requiring ureteral stenting complicated by post surgery delirium which has mostly resolved. eGFR come up to 53 by the time of discharge. He was discharged on 12/29/2019 and had TCM telephone follow up on 12/30/2019.  He was to follow up with orthopedic surgery as outpatient. Patient was seen by Podiatrist Dr. Roland Rack on 01/06/2020.  He reports good compliance with treatment. He reports this condition is Improved. Patient still has burling during urination and bloody urine. Patient is scheduled for follow up surgery on 01/13/2020 with urologist Dr. Diamantina Providence for ureteroscopy and stent exchange. Is having no unusual dyspnea, no unusual swelling and no chest pains since discharg.e   -----------------------------------------------------------------------------------------     Medications: Outpatient Medications Prior to Visit  Medication Sig  . acetaminophen (TYLENOL) 500 MG tablet Take 500-1,000 mg by mouth every 6 (six) hours as needed for mild pain or moderate pain.   . Ascorbic Acid (VITAMIN C) 1000 MG tablet Take 1,000 mg by mouth daily.  . Cyanocobalamin (VITAMIN B-12) 500 MCG SUBL Place 500 mcg under the tongue daily.   [name] donepezil (ARICEPT) 10 MG tablet Take 10 mg by mouth at bedtime.   [name] ELIQUIS 5 MG TABS tablet TAKE 1 TABLET BY MOUTH TWICE DAILY (Patient taking differently: Take 5 mg by mouth 2 (two) times daily. )  . ezetimibe (ZETIA) 10 MG tablet Take 1 tablet (10 mg total) by mouth daily. (Patient  taking differently: Take 10 mg by mouth at bedtime. )  . finasteride (PROSCAR) 5 MG tablet TAKE ONE TABLET BY MOUTH EVERY DAY  . fluticasone (FLONASE) 50 MCG/ACT nasal spray PLACE TWO SPRAYS IN BOTH NOSRTILS EVERY DAY AS NEEDED FOR ALLERGIES (Patient taking differently: Place 2 sprays into both nostrils daily. )  . lisinopril (ZESTRIL) 2.5 MG tablet Take 1 tablet (2.5 mg total) by mouth daily.  . Magnesium Oxide 500 MG TABS Take 500 mg by mouth every evening.  . metoprolol tartrate (LOPRESSOR) 25 MG tablet Take 1 tablet (25 mg total) by mouth 2 (two) times daily. *OFFICE VISIT NEEDED FOR FURTHER REFILLS*  . Multiple Vitamins-Minerals (PRESERVISION AREDS 2 PO) Take 1 tablet by mouth 2 (two) times daily.   [name] MYRBETRIQ 50 MG TB24 tablet TAKE ONE TABLET BY MOUTH EVERY DAY  . nitroGLYCERIN (NITROSTAT) 0.4 MG SL tablet Place 1 tablet (0.4 mg total) under the tongue every 5 (five) minutes as needed for chest pain.  . pantoprazole (PROTONIX) 40 MG tablet TAKE ONE TABLET BY MOUTH EVERY DAY  . rosuvastatin (CRESTOR) 20 MG tablet TAKE ONE TABLET BY MOUTH EVERY DAY (Patient taking differently: Take 20 mg by mouth every evening. )  . tamsulosin (FLOMAX) 0.4 MG CAPS capsule Take 1 capsule (0.4 mg total) by mouth every evening.  . umeclidinium-vilanterol (ANORO ELLIPTA) 62.5-25 MCG/INH AEPB Inhale 1 puff into the lungs daily.   . [DISCONTINUED] melatonin 5 MG TABS Take 1 tablet (5 mg total) by mouth at bedtime. (Patient not taking:  Reported on 01/09/2020)  . [DISCONTINUED] ondansetron (ZOFRAN ODT) 4 MG disintegrating tablet Take 1 tablet (4 mg total) by mouth every 8 (eight) hours as needed for nausea or vomiting. (Patient not taking: Reported on 01/09/2020)   No facility-administered medications prior to visit.    Review of Systems  Constitutional: Negative for appetite change, chills and fever.  Respiratory: Negative for chest tightness, shortness of breath and wheezing.   Cardiovascular: Negative for chest  pain and palpitations.  Gastrointestinal: Negative for abdominal pain, nausea and vomiting.  Genitourinary: Positive for dysuria, frequency and hematuria.      Objective    BP 97/66 (BP Location: Left Arm, Patient Position: Sitting, Cuff Size: Large)   Pulse 66   Temp (!) 97.3 F (36.3 C) (Temporal)   Resp 18   Wt 193 lb (87.5 kg)   SpO2 98% Comment: room air  BMI 27.30 kg/m    Physical Exam   General: Appearance:     Overweight male sitting in wheelchair in no acute distress  Eyes:    PERRL, conjunctiva/corneas clear, EOM's intact       Lungs:     Clear to auscultation bilaterally, respirations unlabored  Heart:    Normal heart rate. Regular rhythm. No murmurs, rubs, or gallops.   MS:   All extremities are intact.   Neurologic:   Awake, alert, oriented x 3. No apparent focal neurological           defect.        No results found for any visits on 01/09/20.  Assessment & Plan     1. Acute kidney injury superimposed on CKD ([place]) Likely secondary to poor hydration and nephrolithiasis. Nearly back to baseline by the time of hospital discharge.   2. Hematuria, unspecified type Secondary to nephrolithiasis. Follow up procedure scheduled next week by Dr. Glori Luis with no contraindications for surgery.   3. Left nephrolithiasis As above.   4. Postoperative delirium Resolved. Now back to baseline. By patient's wife's report anesthesiology is aware of situation.     No follow-ups on file.      The entirety of the information documented in the History of Present Illness, Review of Systems and Physical Exam were personally obtained by me. Portions of this information were initially documented by the CMA and reviewed by me for thoroughness and accuracy.      Lelon Huh, MD  [facility] [phone] (phone) [phone] (fax)  [place]

## 2020-01-09 ENCOUNTER — Ambulatory Visit (INDEPENDENT_AMBULATORY_CARE_PROVIDER_SITE_OTHER): Payer: Medicare Other | Admitting: Family Medicine

## 2020-01-09 ENCOUNTER — Encounter
Admission: RE | Admit: 2020-01-09 | Discharge: 2020-01-09 | Disposition: A | Discharge status: Home / Self Care | Payer: Medicare Other | Source: Ambulatory Visit | Attending: Urology | Admitting: Urology

## 2020-01-09 ENCOUNTER — Other Ambulatory Visit: Payer: Self-pay

## 2020-01-09 ENCOUNTER — Encounter: Payer: Self-pay | Admitting: Family Medicine

## 2020-01-09 ENCOUNTER — Ambulatory Visit: Payer: Medicare Other

## 2020-01-09 VITALS — BP 106/60 | HR 66 | Temp 97.3°F | Resp 18 | Wt 193.0 lb

## 2020-01-09 DIAGNOSIS — F05 Delirium due to known physiological condition: Secondary | ICD-10-CM

## 2020-01-09 DIAGNOSIS — N200 Calculus of kidney: Secondary | ICD-10-CM | POA: Diagnosis not present

## 2020-01-09 DIAGNOSIS — N179 Acute kidney failure, unspecified: Secondary | ICD-10-CM | POA: Diagnosis not present

## 2020-01-09 DIAGNOSIS — N189 Chronic kidney disease, unspecified: Secondary | ICD-10-CM

## 2020-01-09 DIAGNOSIS — R319 Hematuria, unspecified: Secondary | ICD-10-CM

## 2020-01-09 HISTORY — DX: Unspecified macular degeneration: H35.30

## 2020-01-09 HISTORY — DX: Other complications of anesthesia, initial encounter: T88.59XA

## 2020-01-09 NOTE — Patient Instructions (Addendum)
Your procedure is scheduled on: Friday 5/14 Report to  [place] To find out your arrival time please call [phone] between 1PM - 3PM on Thurs 5/13  Remember: Instructions that are not followed completely may result in serious medical risk,  up to and including death, or upon the discretion of your surgeon and anesthesiologist your  surgery may need to be rescheduled.     _X__ 1. Do not eat food after midnight the night before your procedure.                 No gum chewing or hard candies. You may drink clear liquids up to 2 hours                 before you are scheduled to arrive for your surgery- DO not drink clear                 liquids within 2 hours of the start of your surgery.                 Clear Liquids include:  water, apple juice without pulp, clear Gatorade, G2 or                  Gatorade Zero (avoid Red/Purple/Blue), Black Coffee or Tea (Do not add                 anything to coffee or tea). _____2.   Complete the carbohydrate drink provided to you, 2 hours before arrival.  __X__2.  On the morning of surgery brush your teeth with toothpaste and water, you                may rinse your mouth with mouthwash if you wish.  Do not swallow any toothpaste of mouthwash.     ___ 3.  No Alcohol for 24 hours before or after surgery.   ___ 4.  Do Not Smoke or use e-cigarettes For 24 Hours Prior to Your Surgery.                 Do not use any chewable tobacco products for at least 6 hours prior to                 Surgery.  _X__  5.  Do not use any recreational drugs (marijuana, cocaine, heroin, ecstacy, MDMA or other)                For at least one week prior to your surgery.  Combination of these drugs with anesthesia                May have life threatening results.  ____  6.  Bring all medications with you on the day of surgery if instructed.   _x___  7.  Notify your doctor if there is any change in your medical condition   (cold, fever, infections).     Do not wear jewelry. Do not wear lotions,  You may wear deodorant. Do not shave 48 hours prior to surgery. Men may shave face and neck. Do not bring valuables to the hospital.    [facility] is not responsible for any belongings or valuables.  Contacts, dentures or bridgework may not be worn into surgery. Leave your suitcase in the car. After surgery it may be brought to your room. For patients admitted to the hospital, discharge time is determined by your treatment team.   Patients discharged the day of surgery  will not be allowed to drive home.   Make arrangements for someone to be with you for the first 24 hours of your Same Day Discharge.    Please read over the following fact sheets that you were given:    _x___ Take these medicines the morning of surgery with A SIP OF WATER:    1. acetaminophen (TYLENOL) 500 MG tablet if needed  2. finasteride (PROSCAR) 5 MG tablet  3. fluticasone (FLONASE) 50 MCG/ACT nasal spray if needed  4.metoprolol tartrate (LOPRESSOR) 25 MG tablet  5.pantoprazole (PROTONIX) 40 MG tablet  6.  ____ Fleet Enema (as directed)   _x___ Sponge bath the night before surgery, clean sheets on bed, sponge bath the morning of surgery  ____ Use Benzoyl Peroxide Gel as instructed  __x__ Use inhalers on the day of surgery  umeclidinium-vilanterol (ANORO ELLIPTA) 62.5-25 MCG/INH AEPB ____ Stop metformin 2 days prior to surgery    ____ Take 1/2 of usual insulin dose the night before surgery. No insulin the morning          of surgery.   _x___ May continue ELIQUIS 5 MG TABS tablet  Do Not take the morning of surgery  ____ Stop Anti-inflammatories on    __x__ Stop supplements until after surgery.  Ascorbic Acid (VITAMIN C) 1000 MG tablet,  melatonin 5 MG TABS ____ Bring C-Pap to the hospital.

## 2020-01-11 ENCOUNTER — Other Ambulatory Visit
Admission: RE | Admit: 2020-01-11 | Discharge: 2020-01-11 | Disposition: A | Discharge status: Home / Self Care | Payer: Medicare Other | Source: Ambulatory Visit | Attending: Urology | Admitting: Urology

## 2020-01-11 ENCOUNTER — Ambulatory Visit: Payer: Medicare Other

## 2020-01-11 ENCOUNTER — Other Ambulatory Visit: Payer: Self-pay

## 2020-01-11 DIAGNOSIS — Z20822 Contact with and (suspected) exposure to covid-19: Secondary | ICD-10-CM | POA: Diagnosis not present

## 2020-01-11 DIAGNOSIS — Z01812 Encounter for preprocedural laboratory examination: Secondary | ICD-10-CM | POA: Diagnosis present

## 2020-01-11 LAB — SARS CORONAVIRUS 2 (TAT 6-24 HRS): SARS Coronavirus 2: NEGATIVE

## 2020-01-12 ENCOUNTER — Telehealth: Payer: Self-pay

## 2020-01-12 NOTE — Telephone Encounter (Signed)
Copied from [place] [id number]. Topic: General - Other >> Jan 12, 2020  4:50 PM Rainey Pines A wrote: Physical therapist called to inform that patient missed PT visit today due to patient preparing for surgery tomorrow as per patients request.

## 2020-01-13 ENCOUNTER — Ambulatory Visit: Payer: Medicare Other | Admitting: Anesthesiology

## 2020-01-13 ENCOUNTER — Encounter
Admission: RE | Disposition: A | Discharge status: Home / Self Care | Payer: Self-pay | Source: Home / Self Care | Attending: Urology

## 2020-01-13 ENCOUNTER — Encounter (INDEPENDENT_AMBULATORY_CARE_PROVIDER_SITE_OTHER): Payer: Medicare Other | Admitting: Family Medicine

## 2020-01-13 ENCOUNTER — Ambulatory Visit
Admission: RE | Admit: 2020-01-13 | Discharge: 2020-01-13 | Disposition: A | Discharge status: Home / Self Care | Payer: Medicare Other | Attending: Urology | Admitting: Urology

## 2020-01-13 ENCOUNTER — Ambulatory Visit: Payer: Medicare Other

## 2020-01-13 ENCOUNTER — Other Ambulatory Visit: Payer: Self-pay

## 2020-01-13 ENCOUNTER — Telehealth: Payer: Self-pay | Admitting: Urology

## 2020-01-13 ENCOUNTER — Encounter: Payer: Self-pay | Admitting: Urology

## 2020-01-13 DIAGNOSIS — Z8546 Personal history of malignant neoplasm of prostate: Secondary | ICD-10-CM | POA: Insufficient documentation

## 2020-01-13 DIAGNOSIS — F05 Delirium due to known physiological condition: Secondary | ICD-10-CM

## 2020-01-13 DIAGNOSIS — M810 Age-related osteoporosis without current pathological fracture: Secondary | ICD-10-CM | POA: Insufficient documentation

## 2020-01-13 DIAGNOSIS — N201 Calculus of ureter: Secondary | ICD-10-CM | POA: Diagnosis not present

## 2020-01-13 DIAGNOSIS — Z86010 Personal history of colonic polyps: Secondary | ICD-10-CM | POA: Insufficient documentation

## 2020-01-13 DIAGNOSIS — A419 Sepsis, unspecified organism: Secondary | ICD-10-CM

## 2020-01-13 DIAGNOSIS — Z87442 Personal history of urinary calculi: Secondary | ICD-10-CM | POA: Diagnosis not present

## 2020-01-13 DIAGNOSIS — I2510 Atherosclerotic heart disease of native coronary artery without angina pectoris: Secondary | ICD-10-CM

## 2020-01-13 DIAGNOSIS — I10 Essential (primary) hypertension: Secondary | ICD-10-CM | POA: Insufficient documentation

## 2020-01-13 DIAGNOSIS — N179 Acute kidney failure, unspecified: Secondary | ICD-10-CM

## 2020-01-13 DIAGNOSIS — I739 Peripheral vascular disease, unspecified: Secondary | ICD-10-CM | POA: Insufficient documentation

## 2020-01-13 DIAGNOSIS — I255 Ischemic cardiomyopathy: Secondary | ICD-10-CM

## 2020-01-13 DIAGNOSIS — Z951 Presence of aortocoronary bypass graft: Secondary | ICD-10-CM | POA: Insufficient documentation

## 2020-01-13 DIAGNOSIS — F419 Anxiety disorder, unspecified: Secondary | ICD-10-CM | POA: Diagnosis not present

## 2020-01-13 DIAGNOSIS — F0390 Unspecified dementia without behavioral disturbance: Secondary | ICD-10-CM

## 2020-01-13 DIAGNOSIS — M80072D Age-related osteoporosis with current pathological fracture, left ankle and foot, subsequent encounter for fracture with routine healing: Secondary | ICD-10-CM | POA: Diagnosis not present

## 2020-01-13 DIAGNOSIS — D649 Anemia, unspecified: Secondary | ICD-10-CM | POA: Diagnosis not present

## 2020-01-13 DIAGNOSIS — K219 Gastro-esophageal reflux disease without esophagitis: Secondary | ICD-10-CM | POA: Diagnosis not present

## 2020-01-13 DIAGNOSIS — M5430 Sciatica, unspecified side: Secondary | ICD-10-CM | POA: Insufficient documentation

## 2020-01-13 DIAGNOSIS — N136 Pyonephrosis: Secondary | ICD-10-CM | POA: Diagnosis not present

## 2020-01-13 DIAGNOSIS — M1990 Unspecified osteoarthritis, unspecified site: Secondary | ICD-10-CM | POA: Diagnosis not present

## 2020-01-13 DIAGNOSIS — G4730 Sleep apnea, unspecified: Secondary | ICD-10-CM | POA: Diagnosis not present

## 2020-01-13 DIAGNOSIS — Z87891 Personal history of nicotine dependence: Secondary | ICD-10-CM | POA: Insufficient documentation

## 2020-01-13 DIAGNOSIS — K449 Diaphragmatic hernia without obstruction or gangrene: Secondary | ICD-10-CM | POA: Diagnosis not present

## 2020-01-13 DIAGNOSIS — I714 Abdominal aortic aneurysm, without rupture: Secondary | ICD-10-CM | POA: Insufficient documentation

## 2020-01-13 DIAGNOSIS — M170 Bilateral primary osteoarthritis of knee: Secondary | ICD-10-CM | POA: Diagnosis not present

## 2020-01-13 DIAGNOSIS — J449 Chronic obstructive pulmonary disease, unspecified: Secondary | ICD-10-CM | POA: Insufficient documentation

## 2020-01-13 DIAGNOSIS — J432 Centrilobular emphysema: Secondary | ICD-10-CM

## 2020-01-13 DIAGNOSIS — H3530 Unspecified macular degeneration: Secondary | ICD-10-CM | POA: Insufficient documentation

## 2020-01-13 DIAGNOSIS — I480 Paroxysmal atrial fibrillation: Secondary | ICD-10-CM | POA: Insufficient documentation

## 2020-01-13 HISTORY — PX: CYSTOSCOPY/URETEROSCOPY/HOLMIUM LASER/STENT PLACEMENT: SHX6546

## 2020-01-13 HISTORY — PX: CYSTOSCOPY W/ RETROGRADES: SHX1426

## 2020-01-13 SURGERY — CYSTOSCOPY/URETEROSCOPY/HOLMIUM LASER/STENT PLACEMENT
Anesthesia: General | Site: Ureter | Laterality: Left

## 2020-01-13 MED ORDER — LACTATED RINGERS IV SOLN: INTRAVENOUS | Status: DC

## 2020-01-13 MED ORDER — FENTANYL CITRATE (PF) 100 MCG/2ML IJ SOLN
25.0000 ug | INTRAMUSCULAR | Status: DC | PRN
  Administered 2020-01-13: 25 ug via INTRAVENOUS

## 2020-01-13 MED ORDER — PHENYLEPHRINE HCL (PRESSORS) 10 MG/ML IV SOLN
INTRAVENOUS | Status: DC | PRN
  Administered 2020-01-13: 200 ug via INTRAVENOUS
  Administered 2020-01-13 (×2): 100 ug via INTRAVENOUS

## 2020-01-13 MED ORDER — CEPHALEXIN 250 MG PO CAPS: 250.0000 mg | ORAL_CAPSULE | Freq: Every day | ORAL | 0 refills | Status: AC

## 2020-01-13 MED ORDER — FENTANYL CITRATE (PF) 100 MCG/2ML IJ SOLN
INTRAMUSCULAR | Status: AC
  Filled 2020-01-13: qty 2

## 2020-01-13 MED ORDER — PROPOFOL 10 MG/ML IV BOLUS
INTRAVENOUS | Status: AC
  Filled 2020-01-13: qty 20

## 2020-01-13 MED ORDER — LIDOCAINE HCL (CARDIAC) PF 100 MG/5ML IV SOSY
PREFILLED_SYRINGE | INTRAVENOUS | Status: DC | PRN
  Administered 2020-01-13: 100 mg via INTRAVENOUS

## 2020-01-13 MED ORDER — CEFAZOLIN SODIUM-DEXTROSE 2-4 GM/100ML-% IV SOLN
2.0000 g | INTRAVENOUS | Status: AC
  Administered 2020-01-13: 2 g via INTRAVENOUS

## 2020-01-13 MED ORDER — PROPOFOL 500 MG/50ML IV EMUL
INTRAVENOUS | Status: DC | PRN
  Administered 2020-01-13: 200 ug/kg/min via INTRAVENOUS

## 2020-01-13 MED ORDER — ACETAMINOPHEN 10 MG/ML IV SOLN
INTRAVENOUS | Status: AC
  Filled 2020-01-13: qty 100

## 2020-01-13 MED ORDER — ROCURONIUM BROMIDE 100 MG/10ML IV SOLN
INTRAVENOUS | Status: DC | PRN
  Administered 2020-01-13: 50 mg via INTRAVENOUS

## 2020-01-13 MED ORDER — ONDANSETRON HCL 4 MG/2ML IJ SOLN
INTRAMUSCULAR | Status: DC | PRN
  Administered 2020-01-13: 4 mg via INTRAVENOUS

## 2020-01-13 MED ORDER — IOHEXOL 180 MG/ML  SOLN
INTRAMUSCULAR | Status: DC | PRN
  Administered 2020-01-13: 20 mL

## 2020-01-13 MED ORDER — SUGAMMADEX SODIUM 200 MG/2ML IV SOLN
INTRAVENOUS | Status: DC | PRN
  Administered 2020-01-13: 200 mg via INTRAVENOUS

## 2020-01-13 MED ORDER — ACETAMINOPHEN 10 MG/ML IV SOLN
INTRAVENOUS | Status: DC | PRN
  Administered 2020-01-13: 1000 mg via INTRAVENOUS

## 2020-01-13 MED ORDER — PROPOFOL 10 MG/ML IV BOLUS
INTRAVENOUS | Status: DC | PRN
  Administered 2020-01-13: 100 mg via INTRAVENOUS

## 2020-01-13 MED ORDER — PROPOFOL 500 MG/50ML IV EMUL
INTRAVENOUS | Status: AC
  Filled 2020-01-13: qty 50

## 2020-01-13 MED ORDER — FENTANYL CITRATE (PF) 100 MCG/2ML IJ SOLN
INTRAMUSCULAR | Status: DC | PRN
  Administered 2020-01-13: 50 ug via INTRAVENOUS

## 2020-01-13 MED ORDER — CEFAZOLIN SODIUM-DEXTROSE 2-4 GM/100ML-% IV SOLN
INTRAVENOUS | Status: AC
  Filled 2020-01-13: qty 100

## 2020-01-13 MED ORDER — FAMOTIDINE 20 MG PO TABS: 20.0000 mg | ORAL_TABLET | Freq: Once | ORAL | Status: DC

## 2020-01-13 MED ORDER — ONDANSETRON HCL 4 MG/2ML IJ SOLN: 4.0000 mg | Freq: Once | INTRAMUSCULAR | Status: DC | PRN

## 2020-01-13 SURGICAL SUPPLY — 31 items
BAG DRAIN CYSTO-URO LG1000N (MISCELLANEOUS) ×2 IMPLANT
BRUSH SCRUB EZ 1% IODOPHOR (MISCELLANEOUS) ×2 IMPLANT
CATH URETL 5X70 OPEN END (CATHETERS) IMPLANT
CNTNR SPEC 2.5X3XGRAD LEK (MISCELLANEOUS)
CONT SPEC 4OZ STER OR WHT (MISCELLANEOUS)
CONT SPEC 4OZ STRL OR WHT (MISCELLANEOUS)
CONTAINER SPEC 2.5X3XGRAD LEK (MISCELLANEOUS) IMPLANT
DRAPE UTILITY 15X26 TOWEL STRL (DRAPES) ×2 IMPLANT
FIBER LASER TRAC TIP (UROLOGICAL SUPPLIES) ×1 IMPLANT
GLOVE BIOGEL PI IND STRL 7.5 (GLOVE) ×1 IMPLANT
GLOVE BIOGEL PI INDICATOR 7.5 (GLOVE) ×1
GOWN STRL REUS W/ TWL LRG LVL3 (GOWN DISPOSABLE) ×1 IMPLANT
GOWN STRL REUS W/ TWL XL LVL3 (GOWN DISPOSABLE) ×1 IMPLANT
GOWN STRL REUS W/TWL LRG LVL3 (GOWN DISPOSABLE) ×2
GOWN STRL REUS W/TWL XL LVL3 (GOWN DISPOSABLE) ×2
GUIDEWIRE STR DUAL SENSOR (WIRE) ×3 IMPLANT
INFUSOR MANOMETER BAG 3000ML (MISCELLANEOUS) ×2 IMPLANT
INTRODUCER DILATOR DOUBLE (INTRODUCER) IMPLANT
KIT TURNOVER CYSTO (KITS) ×2 IMPLANT
PACK CYSTO AR (MISCELLANEOUS) ×2 IMPLANT
SET CYSTO W/LG BORE CLAMP LF (SET/KITS/TRAYS/PACK) ×2 IMPLANT
SHEATH URETERAL 12FRX35CM (MISCELLANEOUS) IMPLANT
SOL .9 NS 3000ML IRR  AL (IV SOLUTION) ×2
SOL .9 NS 3000ML IRR AL (IV SOLUTION) ×1
SOL .9 NS 3000ML IRR UROMATIC (IV SOLUTION) ×1 IMPLANT
STENT URET 6FRX24 CONTOUR (STENTS) IMPLANT
STENT URET 6FRX26 CONTOUR (STENTS) ×1 IMPLANT
SURGILUBE 2OZ TUBE FLIPTOP (MISCELLANEOUS) ×2 IMPLANT
SYR 10ML LL (SYRINGE) ×2 IMPLANT
VALVE UROSEAL ADJ ENDO (VALVE) ×1 IMPLANT
WATER STERILE IRR 1000ML POUR (IV SOLUTION) ×2 IMPLANT

## 2020-01-13 NOTE — H&P (Signed)
UROLOGY H&P UPDATE  Agree with prior H&P dated 12/26/2019.  86 year old male who presented with an infected 1 cm proximal left ureteral stone and a partially duplicated system and underwent urgent stent placement.  He presents today for definitive management.  Cardiac: RRR Lungs: CTA bilaterally  Laterality: Left Procedure: Left ureteroscopy, laser lithotripsy, stent placement  Urine: Urine culture 5/4 no growth  We specifically discussed the risks ureteroscopy including bleeding, infection/sepsis, stent related symptoms including flank pain/urgency/frequency/incontinence/dysuria, ureteral injury, inability to access stone, or need for staged or additional procedures.   Billey Co, MD 01/13/2020

## 2020-01-13 NOTE — Discharge Instructions (Signed)

## 2020-01-13 NOTE — Telephone Encounter (Signed)
Done

## 2020-01-13 NOTE — Anesthesia Procedure Notes (Signed)
Procedure Name: Intubation Date/Time: 01/13/2020 11:51 AM Performed by: Gayland Curry, CRNA Pre-anesthesia Checklist: Patient identified, Emergency Drugs available, Suction available and Patient being monitored Patient Re-evaluated:Patient Re-evaluated prior to induction Oxygen Delivery Method: Circle system utilized Preoxygenation: Pre-oxygenation with 100% oxygen Induction Type: IV induction Ventilation: Mask ventilation without difficulty and Oral airway inserted - appropriate to patient size Laryngoscope Size: Mac and 3 Grade View: Grade I Tube type: Oral Tube size: 7.0 mm Number of attempts: 1 Placement Confirmation: ETT inserted through vocal cords under direct vision and positive ETCO2 Secured at: 22 cm Tube secured with: Tape Dental Injury: Teeth and Oropharynx as per pre-operative assessment

## 2020-01-13 NOTE — Telephone Encounter (Signed)
FYI

## 2020-01-13 NOTE — Anesthesia Preprocedure Evaluation (Addendum)
Anesthesia Evaluation  Patient identified by MRN, date of birth, ID band Patient awake    Reviewed: Allergy & Precautions, NPO status , Patient's Chart, lab work & pertinent test results, reviewed documented beta blocker date and time   History of Anesthesia Complications Negative for: history of anesthetic complications  Airway Mallampati: III       Dental  (+) Upper Dentures, Lower Dentures   Pulmonary neg shortness of breath, sleep apnea, Continuous Positive Airway Pressure Ventilation and Oxygen sleep apnea , COPD,  COPD inhaler, neg recent URI, Patient abstained from smoking., former smoker,           Cardiovascular hypertension, Pt. on medications and Pt. on home beta blockers (-) angina+ CAD, + CABG and + Peripheral Vascular Disease  + dysrhythmias Atrial Fibrillation (-) Valvular Problems/Murmurs     Neuro/Psych PSYCHIATRIC DISORDERS Anxiety Dementia    GI/Hepatic Neg liver ROS, hiatal hernia, GERD  Medicated and Controlled,  Endo/Other  neg diabetesHypothyroidism   Renal/GU Renal disease (stones)     Musculoskeletal  (+) Arthritis , Osteoarthritis,    Abdominal   Peds  Hematology  (+) Blood dyscrasia, anemia ,   Anesthesia Other Findings Past Medical History: 05/2019: AAA (abdominal aortic aneurysm) (HCC)     Comment:  Relativley Rapid progression from June-Aug 2020 (4.6 -               5.4 cm): s/p EVAR (Dr. Lucky Cowboy [facility]): EVAR - 23 mm prox, 12              cm distal & x 18 cm length Gore Excluder Endoprosthesis               Main body (via RFA distal to lowest Renal A); 63mm x 14               cm L Contralateral Limb for L Iliac - extended with 8 mm               x 6cm LifeStar stent (post-dilated with 7 mm DEB).                Additional R Iliac PTA not required.  No date: Arthritis 01/03/2013: Basal cell carcinoma of eyelid     Comment:  2019 - R side of Nose No date: Benign neoplasm of ascending  colon No date: Benign neoplasm of descending colon 2007: Bilateral iliac artery stenosis (HCC)     Comment:  Bilateral Iliac A stenting; extension of EVAR limbs into              both Iliacs -- additional stent placed in L Iliac. No date: Cancer [facility])     Comment:  Skin cancer No date: Colon polyps No date: Complication of anesthesia     Comment:  severe confusion agitation requiring hospital admission               x 4 days 04/2019: Compression fracture of fourth lumbar vertebra (HCC)     Comment:  - s/p Kyphoplasty; T12, L3-5 (*[facility]) No date: COPD (chronic obstructive pulmonary disease) ([place])     Comment:  Centrilobular Emphysema 1998: Coronary artery disease involving native coronary artery  without angina pectoris     Comment:  - s/p CABG, occluded SVG-D1; patent LIMA-LAD, SVG-OM,               SVG- RCA No date: Dementia ([place])     Comment:  memory loss - started noting late 2019 No date:  Falls frequently     Comment:  broke foot 2021: Fracture     Comment:  left foot No date: GERD (gastroesophageal reflux disease) 02/10/2015: Hemorrhoid No date: History of hiatal hernia No date: History of kidney stones No date: HOH (hard of hearing) No date: Hypertension No date: Macular degeneration disease No date: Osteoporosis No date: PAF (paroxysmal atrial fibrillation) (HCC)     Comment:  on Eliquis for OAC - Rate Control  No date: Prostate cancer ([place]) No date: Prostate disorder No date: Sciatic pain     Comment:  Chronic No date: Sleep apnea     Comment:  cpap 02/10/2015: Temporary cerebral vascular dysfunction     Comment:  Had negative work-up.  July, 2012.  No medication               changes, had forgot them that day, got dehydrated.  No date: Urinary incontinence   Reproductive/Obstetrics                             Anesthesia Physical  Anesthesia Plan  ASA: III and emergent  Anesthesia Plan: General   Post-op Pain Management:     Induction: Intravenous  PONV Risk Score and Plan: 1 and Propofol infusion, TIVA and Ondansetron  Airway Management Planned: Oral ETT  Additional Equipment:   Intra-op Plan:   Post-operative Plan:   Informed Consent: I have reviewed the patients History and Physical, chart, labs and discussed the procedure including the risks, benefits and alternatives for the proposed anesthesia with the patient or authorized representative who has indicated his/her understanding and acceptance.       Plan Discussed with:   Anesthesia Plan Comments:        Anesthesia Quick Evaluation

## 2020-01-13 NOTE — Telephone Encounter (Signed)
-----   Message from Billey Co, MD sent at 01/13/2020 12:59 PM EDT ----- Regarding: follow up Please schedule stent removal in 7-12 days, ok to [facility], visitor ok for dementia  Nickolas Madrid, MD 01/13/2020

## 2020-01-13 NOTE — Op Note (Signed)
Date of procedure: 01/13/20  Preoperative diagnosis:  1. Left 1 cm left proximal ureteral stone  Postoperative diagnosis:  1. Same  Procedure: 1. Cystoscopy, left ureteroscopy, laser lithotripsy, left retrograde pyelogram with intraoperative interpretation, left ureteral stent placement  Surgeon: Nickolas Madrid, MD  Anesthesia: General  Complications: None  Intraoperative findings:  1.  Cystic prostate with high bladder neck 2.  Uncomplicated dusting of 1 cm left proximal ureteral stone with stent placement in lower pole moiety  EBL: None  Specimens: None  Drains: Left 6 French by 26 cm ureteral stent  Indication: [name] is a 86 y.o. patient who previously presented with fever and confusion and a 1 cm left ureteral stone with concern for upstream infection and underwent urgent left ureteral stent placement.  He presents today for definitive management.  After reviewing the management options for treatment, they elected to proceed with the above surgical procedure(s). We have discussed the potential benefits and risks of the procedure, side effects of the proposed treatment, the likelihood of the patient achieving the goals of the procedure, and any potential problems that might occur during the procedure or recuperation. Informed consent has been obtained.  Description of procedure:  The patient was taken to the operating room and general anesthesia was induced. SCDs were placed for DVT prophylaxis. The patient was placed in the dorsal lithotomy position, prepped and draped in the usual sterile fashion, and preoperative antibiotics were administered. A preoperative time-out was performed.   A 21 French rigid cystoscope was used to intubate the urethra and a normal plane urethra was followed proximally into the bladder.  There is a large cystic space within the prostate and a high bladder neck.  Cystoscopy was performed and showed no abnormal findings.  A wire was passed  alongside the left indwelling ureteral stent up to the lower pole collecting system under fluoroscopic vision.  The stent was then grasped and pulled to the meatus, and a second safety wire was added.  A single-channel digital flexible ureteroscope was advanced over the wire up to the level of the stone in the proximal ureter proximal to the bifurcation of the duplicated system.  There was a 1 cm black stone that appeared quite hard.  A 200 m laser fiber on settings of 0.5 J and 40 Hz was used to fragment the stone to <1 mm fragments.  Thorough pyeloscopy revealed no other stone fragments or injury.  Retrograde pyelogram was performed from the mid ureter and showed contrast opacifying the lower pole system, but no contrast in the upper pole moiety.  Careful pullback ureteroscopy revealed no ureteral injuries or residual fragments.  A 21 French rigid cystoscope was backloaded over the wire, and a 6 [place] by 26 cm ureteral stent was uneventfully placed with an excellent curl in the left lower pole moiety as well as under direct vision the bladder.  There was brisk drainage of urine through the side ports of the stent.  Disposition: Stable to PACU  Plan: Keflex prophylaxis while stent in place Follow-up in clinic in 1 week for stent removal Must void prior to discharge  Nickolas Madrid, MD

## 2020-01-13 NOTE — Transfer of Care (Signed)
Immediate Anesthesia Transfer of Care Note  Patient: [name]  Procedure(s) Performed: CYSTOSCOPY/URETEROSCOPY/HOLMIUM LASER/STENT EXCHANGE (Left Ureter) CYSTOSCOPY WITH RETROGRADE PYELOGRAM (Left Ureter)  Patient Location: PACU  Anesthesia Type:General  Level of Consciousness: awake, alert  and patient cooperative  Airway & Oxygen Therapy: Patient Spontanous Breathing and Patient connected to nasal cannula oxygen  Post-op Assessment: Report given to RN and Post -op Vital signs reviewed and stable  Post vital signs: Reviewed and stable  Last Vitals:  Vitals Value Taken Time  BP 147/70 01/13/20 1236  Temp 36.4 C 01/13/20 1236  Pulse 65 01/13/20 1239  Resp 14 01/13/20 1239  SpO2 98 % 01/13/20 1239  Vitals shown include unvalidated device data.  Last Pain:  Vitals:   01/13/20 1236  TempSrc:   PainSc: 0-No pain         Complications: No apparent anesthesia complications

## 2020-01-16 ENCOUNTER — Ambulatory Visit: Payer: Medicare Other

## 2020-01-18 ENCOUNTER — Ambulatory Visit: Payer: Medicare Other

## 2020-01-18 ENCOUNTER — Telehealth: Payer: Self-pay

## 2020-01-18 NOTE — Telephone Encounter (Signed)
Patient's wife called stating that he is having blood in his urine and wanted to know if this was normal\. She was notified that this is normal post op with stent placement and may continue until stent is removed. She verbalized understanding

## 2020-01-20 ENCOUNTER — Ambulatory Visit: Payer: Self-pay | Admitting: Family Medicine

## 2020-01-22 NOTE — Anesthesia Postprocedure Evaluation (Signed)
Anesthesia Post Note  Patient: [name]  Procedure(s) Performed: CYSTOSCOPY/URETEROSCOPY/HOLMIUM LASER/STENT EXCHANGE (Left Ureter) CYSTOSCOPY WITH RETROGRADE PYELOGRAM (Left Ureter)  Patient location during evaluation: PACU Anesthesia Type: General Level of consciousness: awake and alert Pain management: pain level controlled Vital Signs Assessment: post-procedure vital signs reviewed and stable Respiratory status: spontaneous breathing, nonlabored ventilation, respiratory function stable and patient connected to nasal cannula oxygen Cardiovascular status: blood pressure returned to baseline and stable Postop Assessment: no apparent nausea or vomiting Anesthetic complications: no     Last Vitals:  Vitals:   01/13/20 1340 01/13/20 1403  BP:  (!) 179/68  Pulse: 65 71  Resp: 16 16  Temp: (!) 36.2 C   SpO2: 96% 98%    Last Pain:  Vitals:   01/16/20 0853  TempSrc:   PainSc: 0-No pain                 [name]

## 2020-01-23 ENCOUNTER — Ambulatory Visit: Payer: Medicare Other

## 2020-01-25 ENCOUNTER — Other Ambulatory Visit: Payer: Self-pay

## 2020-01-25 ENCOUNTER — Ambulatory Visit (INDEPENDENT_AMBULATORY_CARE_PROVIDER_SITE_OTHER): Payer: Medicare Other | Admitting: Urology

## 2020-01-25 ENCOUNTER — Ambulatory Visit: Payer: Medicare Other

## 2020-01-25 ENCOUNTER — Encounter: Payer: Self-pay | Admitting: Urology

## 2020-01-25 VITALS — BP 131/79 | HR 58 | Ht 70.0 in | Wt 199.0 lb

## 2020-01-25 DIAGNOSIS — N200 Calculus of kidney: Secondary | ICD-10-CM | POA: Diagnosis not present

## 2020-01-25 DIAGNOSIS — N3281 Overactive bladder: Secondary | ICD-10-CM

## 2020-01-25 DIAGNOSIS — Z466 Encounter for fitting and adjustment of urinary device: Secondary | ICD-10-CM

## 2020-01-25 DIAGNOSIS — C61 Malignant neoplasm of prostate: Secondary | ICD-10-CM

## 2020-01-25 LAB — URINALYSIS, COMPLETE
Bilirubin, UA: NEGATIVE
Glucose, UA: NEGATIVE
Ketones, UA: NEGATIVE
Nitrite, UA: NEGATIVE
Specific Gravity, UA: >1.03 — ABNORMAL HIGH (ref 1.005–1.030)
Urobilinogen, Ur: 0.2 mg/dL (ref 0.2–1.0)
pH, UA: 5.5 (ref 5.0–7.5)

## 2020-01-25 LAB — MICROSCOPIC EXAMINATION
Bacteria, UA: NONE SEEN
RBC, Urine: >30 /hpf — AB (ref 0–2)

## 2020-01-25 MED ORDER — CEPHALEXIN 250 MG PO CAPS
500.0000 mg | ORAL_CAPSULE | Freq: Once | ORAL | Status: AC
  Administered 2020-01-25: 500 mg via ORAL

## 2020-01-25 MED ORDER — LIDOCAINE HCL URETHRAL/MUCOSAL 2 % EX GEL
1.0000 "application " | Freq: Once | CUTANEOUS | Status: AC
  Administered 2020-01-25: 1 via URETHRAL

## 2020-01-25 NOTE — Progress Notes (Signed)
Cystoscopy Procedure Note:  Indication: Stent removal s/p 5/14 L URS/LL/stent for 1cm left proximal ureteral stone in partially duplicated system  After informed consent and discussion of the procedure and its risks, DECARI KOPPLIN was positioned and prepped in the standard fashion. Cystoscopy was performed with a flexible cystoscope. The stent was grasped with flexible graspers and removed in its entirety. The patient tolerated the procedure well.  Findings: Uncomplicated stent removal  Assessment and Plan: Follow up in 6 weeks with renal ultrasound to evaluate for silent hydronephrosis Discuss OAB symptoms at follow up, minimal improvement on mybetriq  Billey Co, MD 01/25/2020

## 2020-01-25 NOTE — Addendum Note (Signed)
Addended by: Donalee Citrin on: 01/25/2020 10:51 AM   Modules accepted: Orders

## 2020-01-25 NOTE — Patient Instructions (Signed)
1. Radiology will call you to schedule renal ultrasound   Ureteral Stent Implantation, Care After This sheet gives you information about how to care for yourself after your procedure. Your health care provider may also give you more specific instructions. If you have problems or questions, contact your health care provider. What can I expect after the procedure? After the procedure, it is common to have:  Nausea.  Mild pain when you urinate. You may feel this pain in your lower back or lower abdomen. The pain should stop within a few minutes after you urinate. This may last for up to 1 week.  A small amount of blood in your urine for several days. Follow these instructions at home: Medicines  Take over-the-counter and prescription medicines only as told by your health care provider.  If you were prescribed an antibiotic medicine, take it as told by your health care provider. Do not stop taking the antibiotic even if you start to feel better.  Do not drive for 24 hours if you were given a sedative during your procedure.  Ask your health care provider if the medicine prescribed to you requires you to avoid driving or using heavy machinery. Activity  Rest as told by your health care provider.  Avoid sitting for a long time without moving. Get up to take short walks every 1-2 hours. This is important to improve blood flow and breathing. Ask for help if you feel weak or unsteady.  Return to your normal activities as told by your health care provider. Ask your health care provider what activities are safe for you. General instructions   Watch for any blood in your urine. Call your health care provider if the amount of blood in your urine increases.  If you have a catheter: ? Follow instructions from your health care provider about taking care of your catheter and collection bag. ? Do not take baths, swim, or use a hot tub until your health care provider approves. Ask your health care  provider if you may take showers. You may only be allowed to take sponge baths.  Drink enough fluid to keep your urine pale yellow.  Do not use any products that contain nicotine or tobacco, such as cigarettes, e-cigarettes, and chewing tobacco. These can delay healing after surgery. If you need help quitting, ask your health care provider.  Keep all follow-up visits as told by your health care provider. This is important. Contact a health care provider if:  You have pain that gets worse or does not get better with medicine, especially pain when you urinate.  You have difficulty urinating.  You feel nauseous or you vomit repeatedly during a period of more than 2 days after the procedure. Get help right away if:  Your urine is dark red or has blood clots in it.  You are leaking urine (have incontinence).  The end of the stent comes out of your urethra.  You cannot urinate.  You have sudden, sharp, or severe pain in your abdomen or lower back.  You have a fever.  You have swelling or pain in your legs.  You have difficulty breathing. Summary  After the procedure, it is common to have mild pain when you urinate that goes away within a few minutes after you urinate. This may last for up to 1 week.  Watch for any blood in your urine. Call your health care provider if the amount of blood in your urine increases.  Take over-the-counter and prescription  medicines only as told by your health care provider.  Drink enough fluid to keep your urine pale yellow. This information is not intended to replace advice given to you by your health care provider. Make sure you discuss any questions you have with your health care provider. Document Revised: 05/25/2018 Document Reviewed: 05/26/2018 Elsevier Patient Education  2020 [employer].

## 2020-02-01 ENCOUNTER — Ambulatory Visit: Payer: Medicare Other

## 2020-02-03 ENCOUNTER — Telehealth: Payer: Self-pay | Admitting: Family Medicine

## 2020-02-03 NOTE — Telephone Encounter (Signed)
Verbal orders given to [name] 

## 2020-02-03 NOTE — Telephone Encounter (Signed)
That's fine

## 2020-02-03 NOTE — Telephone Encounter (Signed)
Zadie Rhine From [facility] is calling request verbal approval for PT 1x a week for 4 weeks CB- [phone]

## 2020-02-06 ENCOUNTER — Ambulatory Visit: Payer: Self-pay

## 2020-02-06 NOTE — Telephone Encounter (Signed)
Can put lisinopril on hold

## 2020-02-06 NOTE — Telephone Encounter (Addendum)
[place],PT with [facility] reports pt.'s BP was 74/43 with digital BP cuff. Pt. Had therapy, walked around outside without difficulty. BP now up to 90/50 and 92/56 with pulse 60.Pt. drinking water. No symptoms. No complaints.Spoke with Jiles Garter in the practice and will send triage over for review. Instructed to go to ED if symptoms occur - weakness, shortness of breath, dizziness, chest pain. Verbalizes understanding. Please call Geanie Kenning [phone].  Answer Assessment - Initial Assessment Questions 1. BLOOD PRESSURE: "What is the blood pressure?" "Did you take at least two measurements 5 minutes apart?"     74/43 then 90/60 2. ONSET: "When did you take your blood pressure?"     Last reading 5 minuues 3. HOW: "How did you obtain the blood pressure?" (e.g., visiting nurse, automatic home BP monitor)     Home monitor 4. HISTORY: "Do you have a history of low blood pressure?" "What is your blood pressure normally?"     No 5. MEDICATIONS: "Are you taking any medications for blood pressure?" If yes: "Have they been changed recently?"     No missed doses 6. PULSE RATE: "Do you know what your pulse rate is?"      60 and 57 7. OTHER SYMPTOMS: "Have you been sick recently?" "Have you had a recent injury?"     No 8. PREGNANCY: "Is there any chance you are pregnant?" "When was your last menstrual period?"     n/a  Protocols used: LOW BLOOD PRESSURE-A-AH

## 2020-02-07 NOTE — Telephone Encounter (Signed)
Mrs. [name] advised as below. She reports that patients BP was normal last night. Patient is taking all medications as prescribed including Lisinopril. Patient's BP is normal today with Lisinopril. She reports that he will continue to take medications and check BP daily. Will hold on lisinopril if BP readings are low again.

## 2020-02-08 ENCOUNTER — Telehealth: Payer: Self-pay

## 2020-02-08 NOTE — Telephone Encounter (Signed)
Patient daughter is calling about the patient because he is having uncontrolled bowel movements that he can not make it to the bathroom. He is having rectal burning that he has been using Vaseline for. She states the has had rectal bleeding but it is very rare. Made appointment for patient on  04/09/2020. Please advised what you recommend till appointment

## 2020-02-09 ENCOUNTER — Telehealth: Payer: Self-pay

## 2020-02-09 NOTE — Telephone Encounter (Signed)
Copied from [place] [phone]. Topic: General - Inquiry >> Feb 09, 2020  3:33 PM Alease Frame wrote: Reason for CRM: [name] from [facility] requesting that  pt is needing a walker . Please fax prescription to [facility]   Fax [phone]

## 2020-02-09 NOTE — Telephone Encounter (Signed)
Please advise 

## 2020-02-09 NOTE — Telephone Encounter (Signed)
I have seen this patient for heme positive stools and a cancerous polyp with follow up colonoscopy for the polyp. I have not seen him for any reports of diarrhea. If it is really bad go to ER. Otherwise lets get a stool c. Diff and GI panel.

## 2020-02-10 NOTE — Telephone Encounter (Signed)
Pt's daughter advised of Dr. Dorothey Baseman recommendation. Will try it for a week or so then let me know if they decide to do the stool labs.

## 2020-02-13 ENCOUNTER — Other Ambulatory Visit: Payer: Self-pay

## 2020-02-13 ENCOUNTER — Ambulatory Visit
Admission: EM | Admit: 2020-02-13 | Discharge: 2020-02-13 | Disposition: A | Discharge status: Home / Self Care | Payer: Medicare Other | Attending: Family Medicine | Admitting: Family Medicine

## 2020-02-13 ENCOUNTER — Telehealth: Payer: Self-pay | Admitting: Family Medicine

## 2020-02-13 DIAGNOSIS — R609 Edema, unspecified: Secondary | ICD-10-CM | POA: Diagnosis not present

## 2020-02-13 MED ORDER — AMOXICILLIN-POT CLAVULANATE 875-125 MG PO TABS
1.0000 | ORAL_TABLET | Freq: Two times a day (BID) | ORAL | 0 refills | Status: DC
Start: 2020-02-13 — End: 2020-02-28

## 2020-02-13 NOTE — ED Triage Notes (Signed)
Patient with a lump on his right side of his face/neck and states that he noticed this today around 6-7pm. Patient states that he does feel fatigued and thought he may been running a fever.

## 2020-02-13 NOTE — ED Provider Notes (Signed)
[facility] ____________________________________________  Time seen: Approximately 9:03 PM  I have reviewed the triage vital signs and the nursing notes.   HISTORY  Chief Complaint [place]   HPI [name] is a 86 y.o. male presenting with wife at bedside for evaluation of right jaw swelling that he noticed around 6 PM this evening.  States the area is tender to touch.  Patient and wife reports the area was not present this morning.  Denies injury.  Denies cough, congestion, chest pain, shortness of breath or fevers.  Has had some ear congestion sensation.  Continues to eat and drink well.  Denies any difficulty swallowing, shortness of breath or oral swelling.  States tenderness to the right neck, denies actual sore throat.  No recent sickness.  Has continued to eat and drink well.  Denies history of the same.  Denies any other swelling.  Denies gum or dental pain.  Denies aggravating alleviating factors.  [name], MD : PCP  ENT: Tami Ribas  Past Medical History:  Diagnosis Date  . AAA (abdominal aortic aneurysm) ([place]) 05/2019   Relativley Rapid progression from June-Aug 2020 (4.6 - 5.4 cm): s/p EVAR (Dr. Lucky Cowboy [facility]): EVAR - 23 mm prox, 12 cm distal & x 18 cm length Gore Excluder Endoprosthesis Main body (via RFA distal to lowest Renal A); 16m x 14 cm L Contralateral Limb for L Iliac - extended with 8 mm x 6cm LifeStar stent (post-dilated with 7 mm DEB).  Additional R Iliac PTA not required.   . Arthritis   . Basal cell carcinoma of eyelid 01/03/2013   2019 - R side of Nose  . Benign neoplasm of ascending colon   . Benign neoplasm of descending colon   . Bilateral iliac artery stenosis ([place] 2007   Bilateral Iliac A stenting; extension of EVAR limbs into both Iliacs -- additional stent placed in L Iliac.  . Cancer ([place]    Skin cancer  . Colon polyps   . Complication of anesthesia    severe confusion agitation requiring hospital admission x 4 days  .  Compression fracture of fourth lumbar vertebra ([place] 04/2019   - s/p Kyphoplasty; T12, L3-5 ([facility]  . COPD (chronic obstructive pulmonary disease) (HCC)    Centrilobular Emphysema  . Coronary artery disease involving native coronary artery without angina pectoris 1998   - s/p CABG, occluded SVG-D1; patent LIMA-LAD, SVG-OM, SVG- RCA  . Dementia ([place]    memory loss - started noting late 2019  . Falls frequently    broke foot  . Fracture 2021   left foot  . GERD (gastroesophageal reflux disease)   . Hemorrhoid 02/10/2015  . History of hiatal hernia   . History of kidney stones   . HOH (hard of hearing)   . Hypertension   . Macular degeneration disease   . Osteoporosis   . PAF (paroxysmal atrial fibrillation) ([place]    on Eliquis for OAC - Rate Control   . Prostate cancer ([place]   . Prostate disorder   . Sciatic pain    Chronic  . Sleep apnea    cpap  . Temporary cerebral vascular dysfunction 02/10/2015   Had negative work-up.  July, 2012.  No medication changes, had forgot them that day, got dehydrated.   . Urinary incontinence     Patient Active Problem List   Diagnosis Date Noted  . Hematuria   . Acute kidney injury superimposed on CKD ([place]   . Sepsis due to  gram-negative UTI ([place]) 12/26/2019  . Left nephrolithiasis 12/26/2019  . Hydronephrosis, left 12/26/2019  . Status post abdominal aortic aneurysm (AAA) repair 04/15/2019  . Candidiasis 02/22/2019  . Malignant neoplasm of descending colon ([place])   . Benign neoplasm of descending colon   . Benign neoplasm of transverse colon   . Do not intubate, cardiopulmonary resuscitation (CPR)-only code status 10/08/2018  . Prostate cancer ([place]) 02/25/2018  . Prostatic cyst 01/15/2018  . 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 ([place]) 09/15/2017  . Blood in stool   . Abnormal feces   . Stricture and stenosis of esophagus   . Mild cognitive impairment 08/18/2017  . Senile purpura ([place]) 04/29/2017  . Anemia 04/29/2017  . Frequent falls  04/29/2017  . Acute respiratory failure ([place]) 12/17/2016  . Simple chronic bronchitis ([place]) 12/17/2016  . Benign localized hyperplasia of prostate with urinary obstruction 07/15/2016  . Elevated PSA 07/15/2016  . Incomplete emptying of bladder 07/15/2016  . Urge incontinence 07/15/2016  . Hypothyroidism 04/25/2016  . Macular degeneration 04/25/2016  . Erectile dysfunction due to arterial insufficiency   . Ischemic heart disease with chronotropic incompetence   . CN (constipation) 02/10/2015  . DD (diverticular disease) 02/10/2015  . Weakness 02/10/2015  . Lichen planus 61/44/3154  . Restless leg 02/10/2015  . 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 B12 deficiency 02/10/2015  . COPD (chronic obstructive pulmonary disease) ([place]) 11/14/2014  . Post Inflammatory Lung Changes 11/14/2014  . Acute delirium 11/08/2014  . PAF (paroxysmal atrial fibrillation) (HCC) CHA2DS2-VASc = 4. AC = Eliquis   . Insomnia 12/09/2013  . OSA on CPAP   . Dyspnea on exertion - -essentially resolved with BB dose reduction & wgt loss 03/29/2013  . Overweight (BMI 25.0-29.9) -- Wgt back up 01/17/2013  . Atherosclerotic heart disease of native coronary artery without angina pectoris - s/p CABG, occluded SVG-D1; patent LIMA-LAD, SVG-OM, SVG- RCA   . Essential hypertension   . Hyperlipidemia with target LDL less than 70   . Aorto-iliac disease ([place])   . BCC (basal cell carcinoma), eyelid 01/03/2013  . Allergic rhinitis 08/17/2009  . Adaptation reaction 05/28/2009  . Acid reflux 05/28/2009  . Arthritis, degenerative 05/28/2009  . 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 Carotid artery occlusion without infarction 06/01/2006  . PAD (peripheral artery disease) - bilateral common iliac stents 12/15/2005  . Atherosclerotic heart disease of artery bypass graft 09/01/1997    Past Surgical History:  Procedure Laterality Date  . ABDOMINAL AORTIC ENDOVASCULAR STENT GRAFT Bilateral 05/18/2019   Procedure:  ABDOMINAL AORTIC ENDOVASCULAR STENT GRAFT;  Surgeon: Algernon Huxley, MD;  Location: [facility] ORS;  Service: Vascular;  Laterality: Bilateral;  . ANKLE SURGERY Left    ORIF   . Arch aortogram and carotid aortogram  06/02/2006   Dr Oneida Alar did surgery  . BASAL CELL CARCINOMA EXCISION  04/2016   Dermatology  . CARDIAC CATHETERIZATION  01/30/1998    (Dr. Linard Millers): Native LAD & mRCA CTO. 90% D1. Mod Cx. SVG-D1 CTO. SVG-RCA & SVG-OM along with LIMA-LAD patent;  LV NORMAL Fxn  . CAROTID ENDARTERECTOMY Left Oct. 29, 2007   Dr. Oneida Alar:  . CATARACT EXTRACTION W/PHACO Right 03/05/2017   Procedure: CATARACT EXTRACTION PHACO AND INTRAOCULAR LENS PLACEMENT (IOC);  Surgeon: Leandrew Koyanagi, MD;  Location: [facility] ORS;  Service: Ophthalmology;  Laterality: Right;  [place] 00:58.5 AP% 10.6 CDE 6.20 Fluid Pack lot # [id number]  . CATARACT EXTRACTION W/PHACO Left 04/15/2017   Procedure: CATARACT EXTRACTION PHACO AND INTRAOCULAR LENS PLACEMENT (IOC)  Left;  Surgeon: Leandrew Koyanagi, MD;  Location: [place];  Service: Ophthalmology;  Laterality: Left;  . COLONOSCOPY WITH PROPOFOL N/A 09/15/2017   Procedure: COLONOSCOPY WITH PROPOFOL;  Surgeon: Lucilla Lame, MD;  Location[facility] ENDOSCOPY;  Service: Endoscopy;  Laterality: N/A;  . COLONOSCOPY WITH PROPOFOL N/A 10/26/2018   Procedure: COLONOSCOPY WITH PROPOFOL;  Surgeon: Lucilla Lame, MD;  Location: [facility] ENDOSCOPY;  Service: Endoscopy;  Laterality: N/A;  . CORONARY ARTERY BYPASS GRAFT  1998   LIMA-LAD, SVG-OM, SVG-RPDA, SVG-DIAG  . CPET / MET  12/2012   Mild chronotropic incompetence - read 82% of predicted; also reduced effort; peak VO2 15.7 / 75% (did not reach Max effort) -- suggested ischemic response in last 1.5 minutes of exercise. Normal pulmonary function on PFTs but poor response to  . CYSTOSCOPY W/ RETROGRADES Left 01/13/2020   Procedure: CYSTOSCOPY WITH RETROGRADE PYELOGRAM;  Surgeon: Billey Co, MD;  Location: [facility];  Service: Urology;   Laterality: Left;  . CYSTOSCOPY WITH STENT PLACEMENT Left 12/26/2019   Procedure: CYSTOSCOPY WITH STENT PLACEMENT;  Surgeon: Billey Co, MD;  Location: [facility]S;  Service: Urology;  Laterality: Left;  . CYSTOSCOPY/URETEROSCOPY/HOLMIUM LASER/STENT PLACEMENT Left 01/13/2020   Procedure: CYSTOSCOPY/URETEROSCOPY/HOLMIUM LASER/STENT EXCHANGE;  Surgeon: Billey Co, MD;  Location: [facility];  Service: Urology;  Laterality: Left;  . ENDOVASCULAR REPAIR/STENT GRAFT N/A 05/18/2019   Procedure: ENDOVASCULAR REPAIR/STENT GRAFT;  Surgeon: Algernon Huxley, MD;  Location: [facility] INVASIVE [facility] LAB;; EVAR - 23 mm prox, 12 cm distal & x 18 cm length Gore Excluder Endoprosthesis Main body (via RFA distal to lowest Renal A); 76m x 14 cm L Contralateral Limb for L Iliac - extended with 8 mm x 6cm LifeStar stent; no additional R Iliac PTA needed.   . ESOPHAGOGASTRODUODENOSCOPY (EGD) WITH PROPOFOL N/A 09/15/2017   Procedure: ESOPHAGOGASTRODUODENOSCOPY (EGD) WITH PROPOFOL;  Surgeon: WLucilla Lame MD;  [facility];  Service: Endoscopy;  Laterality: N/A;  . FRACTURE SURGERY Right 03/19/2015   wrist  . FRACTURE SURGERY Left   . HIATAL HERNIA REPAIR    . ILIAC ARTERY STENT  12/15/2005   PTA and direct stenting rgt and lft common iliac arteries  . KYPHOPLASTY N/A 04/05/2019   Procedure: KYPHOPLASTY T12, L3, L4 L5;  Surgeon: MHessie Knows MD;  Location: [facility];  Service: Orthopedics;  Laterality: N/A;  . NM GATED MYOVIEW ([place])  05/'19; 8/'20   a) [facility]: Low Risk - no ischemia or infarct.  EF > 65%. no RWMA;; b) ([facility]: Normal wall motion.  No ischemia or infarction.  . OPEN REDUCTION INTERNAL FIXATION (ORIF) DISTAL RADIAL FRACTURE Left 01/13/2019   Procedure: OPEN REDUCTION INTERNAL FIXATION (ORIF) DISTAL  RADIAL FRACTURE - LEFT - SLEEP APNEA;  Surgeon: MHessie Knows MD;  Location: [facility];  Service: Orthopedics;  Laterality: Left;  . ORIF ELBOW FRACTURE Right 01/01/2017   Procedure: OPEN  REDUCTION INTERNAL FIXATION (ORIF) ELBOW/OLECRANON FRACTURE;  Surgeon: MHessie Knows MD;  Location: [facility];  Service: Orthopedics;  Laterality: Right;  . ORIF WRIST FRACTURE Right 03/19/2015   Procedure: OPEN REDUCTION INTERNAL FIXATION (ORIF) WRIST FRACTURE;  Surgeon: MHessie Knows MD;  Location: [facility];  Service: Orthopedics;  Laterality: Right;  . SHOULDER ARTHROSCOPY Right   . THORACIC AORTA - CAROTID ANGIOGRAM  October 2007   Dr. 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 TRANSTHORACIC ECHOCARDIOGRAM  February 2016   [facility]: Normal LV function. Dilated left  atrium.  Domingo Dimes ECHOCARDIOGRAM  04/2019   [facility] April 20, 2019): EF 55-60 %.  Mild LVH.  Relatively normal valves.     No current facility-administered medications for this encounter.  Current Outpatient Medications:  .  acetaminophen (TYLENOL) 500 MG tablet, Take 500-1,000 mg by mouth every 6 (six) hours as needed for mild pain or moderate pain. , Disp: , Rfl:  .  Ascorbic Acid (VITAMIN C) 1000 MG tablet, Take 1,000 mg by mouth daily., Disp: , Rfl:  .  Cyanocobalamin (VITAMIN B-12) 500 MCG SUBL, Place 500 mcg under the tongue daily. , Disp: , Rfl:  .  donepezil (ARICEPT) 10 MG tablet, Take 10 mg by mouth at bedtime. , Disp: , Rfl:  .  ELIQUIS 5 MG TABS tablet, TAKE 1 TABLET BY MOUTH TWICE DAILY, Disp: 180 tablet, Rfl: 1 .  ezetimibe (ZETIA) 10 MG tablet, Take 1 tablet (10 mg total) by mouth daily., Disp: 90 tablet, Rfl: 4 .  finasteride (PROSCAR) 5 MG tablet, TAKE ONE TABLET BY MOUTH EVERY DAY, Disp: 90 tablet, Rfl: 3 .  fluticasone (FLONASE) 50 MCG/ACT nasal spray, PLACE TWO SPRAYS IN BOTH NOSRTILS EVERY DAY AS NEEDED FOR ALLERGIES, Disp: 16 g, Rfl: 4 .  lisinopril (ZESTRIL) 2.5 MG tablet, Take 1 tablet (2.5 mg total) by mouth daily., Disp: , Rfl:  .  Magnesium Oxide 500 MG TABS, Take 500 mg by mouth every evening., Disp: ,  Rfl:  .  metoprolol tartrate (LOPRESSOR) 25 MG tablet, Take 1 tablet (25 mg total) by mouth 2 (two) times daily. *OFFICE VISIT NEEDED FOR FURTHER REFILLS*, Disp: 180 tablet, Rfl: 0 .  Multiple Vitamins-Minerals (PRESERVISION AREDS 2 PO), Take 1 tablet by mouth 2 (two) times daily. , Disp: , Rfl:  .  MYRBETRIQ 50 MG TB24 tablet, TAKE ONE TABLET BY MOUTH EVERY DAY, Disp: 30 tablet, Rfl: 11 .  nitroGLYCERIN (NITROSTAT) 0.4 MG SL tablet, Place 1 tablet (0.4 mg total) under the tongue every 5 (five) minutes as needed for chest pain., Disp: 20 tablet, Rfl: 1 .  pantoprazole (PROTONIX) 40 MG tablet, TAKE ONE TABLET BY MOUTH EVERY DAY, Disp: 90 tablet, Rfl: 1 .  rosuvastatin (CRESTOR) 20 MG tablet, TAKE ONE TABLET BY MOUTH EVERY DAY, Disp: 90 tablet, Rfl: 4 .  tamsulosin (FLOMAX) 0.4 MG CAPS capsule, Take 1 capsule (0.4 mg total) by mouth every evening., Disp: 90 capsule, Rfl: 3 .  umeclidinium-vilanterol (ANORO ELLIPTA) 62.5-25 MCG/INH AEPB, Inhale 1 puff into the lungs daily. , Disp: , Rfl:  .  amoxicillin-clavulanate (AUGMENTIN) 875-125 MG tablet, Take 1 tablet by mouth every 12 (twelve) hours., Disp: 20 tablet, Rfl: 0  Allergies Clonazepam, Codeine, Ropinirole, Hydrocodone, Niacin and related, Oxycodone, and Tramadol  Family History  Problem Relation Age of Onset  . Ulcers Mother        Peptic  . Dementia Mother   . Alcohol abuse Father     Social History Social History   Tobacco Use  . Smoking status: Former Smoker    Types: Cigarettes    Quit date: 08/04/1997    Years since quitting: 22.5  . Smokeless tobacco: Never Used  Vaping Use  . Vaping Use: Never used  Substance Use Topics  . Alcohol use: No  . Drug use: No    Review of Systems Constitutional: No fever ENT: No sore throat. As above.  Cardiovascular: Denies chest pain. Respiratory: Denies shortness of breath. Gastrointestinal:  No nausea, no vomiting.  Skin: Negative for  rash.  ____________________________________________   PHYSICAL EXAM:  VITAL SIGNS: ED Triage Vitals [02/13/20 2009]  Enc Vitals Group     BP (!) 148/74     Pulse Rate 70     Resp 18     Temp 98.2 F (36.8 C)     Temp Source Oral     SpO2 98 %     Weight 199 lb 1.2 oz (90.3 kg)     Height '5\' 10"'  (1.778 m)     Head Circumference      Peak Flow      Pain Score 1     Pain Loc      Pain Edu?      Excl. in [place]?     Constitutional: Alert and oriented. Well appearing and in no acute distress. Eyes: Conjunctivae are normal.  Head: Atraumatic.   Ears: no erythema, normal TMs bilaterally.   Nose:No nasal congestion  Mouth/Throat: Mucous membranes are moist. No pharyngeal erythema. No tonsillar swelling or exudate.  No gum tenderness.  No oral sores or lesions.  No oral pharyngeal edema noted. Right submandibular gland swelling and tenderness noted, no erythema or fluctuance.  No oral drainage or drainage noted from Stensen's duct.  No mandibular tenderness. Neck: No stridor.  No cervical spine tenderness to palpation. Hematological/Lymphatic/Immunilogical: No cervical lymphadenopathy.  Right submandibular gland swelling is noted above. Cardiovascular: Normal rate, regular rhythm. Grossly normal heart sounds. Good peripheral circulation. Respiratory: Normal respiratory effort.  No retractions. No wheezes, rales or rhonchi. Good air movement.  Musculoskeletal: Ambulatory with steady gait.  Neurologic:  Normal speech and language. No gait instability. Skin:  Skin appears warm, dry and intact. No rash noted. Psychiatric: Mood and affect are normal. Speech and behavior are normal.  ___________________________________________   LABS (all labs ordered are listed, but only abnormal results are displayed)  Labs Reviewed - No data to display ____________________________________________   PROCEDURES Procedures    INITIAL IMPRESSION / ASSESSMENT AND PLAN / ED COURSE  Pertinent labs &  imaging results that were available during my care of the patient were reviewed by me and considered in my medical decision making (see chart for details).  Well-appearing patient.  Wife at bedside.  Right submandibular gland swelling and tenderness, with acute onset today.  No other edema noted.  Swallowing well without difficulty.  Patient appears well.  Concern for sialoadenitis versus submandibular gland infection.  Will treat with oral Augmentin, discussed sour candy and lozenges.  Mouth rinses, fluids.  Monitor closely.  Follow-up with primary care and ENT.  Discussed for any fevers, increasing swelling, difficulty swallowing or worsening concerns proceed erectly to emergency room.Discussed indication, risks and benefits of medications with patient.   Discussed follow up with Primary care physician this week. Discussed follow up and return parameters including no resolution or any worsening concerns. Patient and wife verbalized understanding and agreed to plan.   ____________________________________________   FINAL CLINICAL IMPRESSION(S) / ED DIAGNOSES  Final diagnoses:  Submandibular gland swelling     ED Discharge Orders         Ordered    amoxicillin-clavulanate (AUGMENTIN) 875-125 MG tablet  Every 12 hours     Discontinue  Reprint     02/13/20 2047           Note: This dictation was prepared with Dragon dictation along with smaller phrase technology. Any transcriptional errors that result from this process are unintentional.         Marylene Land, NP 02/13/20 2116

## 2020-02-13 NOTE — Telephone Encounter (Signed)
Order written, please fax

## 2020-02-13 NOTE — Discharge Instructions (Addendum)
Take medication as prescribed. Sour lozenges/candy. Mouth rinses. Monitor closely.   Follow-up with ear nose and throat for any persisting complaints.  Follow-up with primary care.  Proceed erectly to the emergency room for throat swelling, difficulty swallowing, fevers or worsening complaint.

## 2020-02-27 ENCOUNTER — Telehealth: Payer: Self-pay

## 2020-02-27 ENCOUNTER — Other Ambulatory Visit: Payer: Self-pay | Admitting: Unknown Physician Specialty

## 2020-02-27 ENCOUNTER — Other Ambulatory Visit (HOSPITAL_COMMUNITY): Payer: Self-pay | Admitting: Unknown Physician Specialty

## 2020-02-27 DIAGNOSIS — K115 Sialolithiasis: Secondary | ICD-10-CM

## 2020-02-27 NOTE — Telephone Encounter (Signed)
Copied from [place] [phone]. Topic: General - Other >> Feb 27, 2020  8:50 AM Rainey Pines A wrote: Lemmuel from [facility] stated that the order was incorrect for walker. Lemmuel is requesting the order for a rollator walker be faxed to [phone]. Please advise

## 2020-02-28 ENCOUNTER — Ambulatory Visit (INDEPENDENT_AMBULATORY_CARE_PROVIDER_SITE_OTHER): Payer: Medicare Other | Admitting: Gastroenterology

## 2020-02-28 ENCOUNTER — Ambulatory Visit: Payer: Medicare Other | Attending: Nurse Practitioner

## 2020-02-28 ENCOUNTER — Other Ambulatory Visit: Payer: Self-pay

## 2020-02-28 ENCOUNTER — Encounter: Payer: Self-pay | Admitting: Gastroenterology

## 2020-02-28 VITALS — BP 90/50 | HR 75 | Ht 71.0 in | Wt 191.6 lb

## 2020-02-28 DIAGNOSIS — G8929 Other chronic pain: Secondary | ICD-10-CM | POA: Diagnosis present

## 2020-02-28 DIAGNOSIS — M545 Low back pain, unspecified: Secondary | ICD-10-CM

## 2020-02-28 DIAGNOSIS — R2689 Other abnormalities of gait and mobility: Secondary | ICD-10-CM

## 2020-02-28 DIAGNOSIS — M6281 Muscle weakness (generalized): Secondary | ICD-10-CM | POA: Diagnosis present

## 2020-02-28 DIAGNOSIS — R197 Diarrhea, unspecified: Secondary | ICD-10-CM | POA: Diagnosis not present

## 2020-02-28 DIAGNOSIS — I2510 Atherosclerotic heart disease of native coronary artery without angina pectoris: Secondary | ICD-10-CM

## 2020-02-28 DIAGNOSIS — R2681 Unsteadiness on feet: Secondary | ICD-10-CM | POA: Diagnosis not present

## 2020-02-28 NOTE — Patient Instructions (Signed)
Access Code: [email or web address] URL: [email or web address] Date: 02/28/2020 Prepared by: Janna Arch  Exercises Seated Heel Toe Raises - 1 x daily - 7 x weekly - 2 sets - 10 reps - 5 hold Seated March - 1 x daily - 7 x weekly - 2 sets - 10 reps - 5 hold Seated Long Arc Quad - 1 x daily - 7 x weekly - 2 sets - 10 reps - 5 hold Sit to Stand with Counter Support - 1 x daily - 7 x weekly - 2 sets - 10 reps - 5 hold

## 2020-02-28 NOTE — Therapy (Signed)
[place] MAIN [facility] SERVICES [address] [place], [place], [address] Phone: [phone]   Fax:  [phone]  Physical Therapy Evaluation  Patient Details  Name: [name] MRN: [id number] Date of Birth: 1934/04/04 Referring Provider (PT): Meeler, Sherren Kerns FNP   Encounter Date: 02/28/2020   PT End of Session - 02/28/20 1717    Visit Number 1    Number of Visits 16    Date for PT Re-Evaluation 04/24/20    Authorization Type 1/10 eval 6/29    PT Start Time 0930    PT Stop Time 1011    PT Time Calculation (min) 41 min    Equipment Utilized During Treatment Gait belt    Activity Tolerance Patient tolerated treatment well;Patient limited by fatigue;Patient limited by pain    Behavior During Therapy WFL for tasks assessed/performed           Past Medical History:  Diagnosis Date  . AAA (abdominal aortic aneurysm) ([place]) 05/2019   [place]ativ[place] Rapid progression from June-Aug 2020 (4.6 - 5.4 cm): s/p EVAR (Dr. Lucky Cowboy [facility]): EVAR - 23 mm prox, 12 cm distal & x 18 cm length Gore Excluder Endoprosthesis Main body (via RFA distal to lowest Renal A); 74m x 14 cm L Contralateral Limb for L Iliac - extended with 8 mm x 6cm LifeStar stent (post-dilated with 7 mm DEB).  Additional R Iliac PTA not required.   . Arthritis   . Basal cell carcinoma of eyelid 01/03/2013   2019 - R side of Nose  . Benign neoplasm of ascending colon   . Benign neoplasm of descending colon   . Bilateral iliac artery stenosis ([place] 2007   Bilateral Iliac A stenting; extension of EVAR limbs into both Iliacs -- additional stent placed in L Iliac.  . Cancer ([place]    Skin cancer  . Colon polyps   . Complication of anesthesia    severe confusion agitation requiring hospital admission x 4 days  . Compression fracture of fourth lumbar vertebra ([place] 04/2019   - s/p Kyphoplasty; T12, L3-5 ([facility]  . COPD (chronic obstructive pulmonary disease) (HCC)    Centrilobular  Emphysema  . Coronary artery disease involving native coronary artery without angina pectoris 1998   - s/p CABG, occluded SVG-D1; patent LIMA-LAD, SVG-OM, SVG- RCA  . Dementia ([place]    memory loss - started noting late 2019  . Falls frequently    broke foot  . Fracture 2021   left foot  . GERD (gastroesophageal reflux disease)   . Hemorrhoid 02/10/2015  . History of hiatal hernia   . History of kidney stones   . HOH (hard of hearing)   . Hypertension   . Macular degeneration disease   . Osteoporosis   . PAF (paroxysmal atrial fibrillation) ([place]    on Eliquis for OAC - Rate Control   . Prostate cancer (HP[place]   . Prostate disorder   . Sciatic pain    Chronic  . Sleep apnea    cpap  . Temporary cerebral vascular dysfunction 02/10/2015   Had negative work-up.  July, 2012.  No medication changes, had forgot them that day, got dehydrated.   . Urinary incontinence     Past Surgical History:  Procedure Laterality Date  . ABDOMINAL AORTIC ENDOVASCULAR STENT GRAFT Bilateral 05/18/2019   Procedure: ABDOMINAL AORTIC ENDOVASCULAR STENT GRAFT;  Surgeon: DAlgernon Huxley MD;  Location: [facility] ORS;  Service: Vascular;  Laterality: Bilateral;  . ANKLE SURGERY  Left    ORIF   . Arch aortogram and carotid aortogram  06/02/2006   Dr Oneida Alar did surgery  . BASAL CELL CARCINOMA EXCISION  04/2016   Dermatology  . CARDIAC CATHETERIZATION  01/30/1998    (Dr. Linard Millers): Native LAD & mRCA CTO. 90% D1. Mod Cx. SVG-D1 CTO. SVG-RCA & SVG-OM along with LIMA-LAD patent;  LV NORMAL Fxn  . CAROTID ENDARTERECTOMY Left Oct. 29, 2007   Dr. Oneida Alar:  . CATARACT EXTRACTION W/PHACO Right 03/05/2017   Procedure: CATARACT EXTRACTION PHACO AND INTRAOCULAR LENS PLACEMENT (IOC);  Surgeon: Leandrew Koyanagi, MD;  Location: [facility] ORS;  Service: Ophthalmology;  Laterality: Right;  [place] 00:58.5 AP% 10.6 CDE 6.20 Fluid Pack lot # [id number]  . CATARACT EXTRACTION W/PHACO Left 04/15/2017   Procedure: CATARACT EXTRACTION PHACO AND  INTRAOCULAR LENS PLACEMENT ([place])  Left;  Surgeon: Leandrew Koyanagi, MD;  Location: [place];  Service: Ophthalmology;  Laterality: Left;  . COLONOSCOPY WITH PROPOFOL N/A 09/15/2017   Procedure: COLONOSCOPY WITH PROPOFOL;  Surgeon: Lucilla Lame, MD;  Location: [facility] ENDOSCOPY;  Service: Endoscopy;  Laterality: N/A;  . COLONOSCOPY WITH PROPOFOL N/A 10/26/2018   Procedure: COLONOSCOPY WITH PROPOFOL;  Surgeon: Lucilla Lame, MD;  Location: [facility] ENDOSCOPY;  Service: Endoscopy;  Laterality: N/A;  . CORONARY ARTERY BYPASS GRAFT  1998   LIMA-LAD, SVG-OM, SVG-RPDA, SVG-DIAG  . CPET / MET  12/2012   Mild chronotropic incompetence - read 82% of predicted; also reduced effort; peak VO2 15.7 / 75% (did not reach Max effort) -- suggested ischemic response in last 1.5 minutes of exercise. Normal pulmonary function on PFTs but poor response to  . CYSTOSCOPY W/ RETROGRADES Left 01/13/2020   Procedure: CYSTOSCOPY WITH RETROGRADE PYELOGRAM;  Surgeon: Billey Co, MD;  Location: [facility] ORS;  Service: Urology;  Laterality: Left;  . CYSTOSCOPY WITH STENT PLACEMENT Left 12/26/2019   Procedure: CYSTOSCOPY WITH STENT PLACEMENT;  Surgeon: Billey Co, MD;  Location: [facility] ORS;  Service: Urology;  Laterality: Left;  . CYSTOSCOPY/URETEROSCOPY/HOLMIUM LASER/STENT PLACEMENT Left 01/13/2020   Procedure: CYSTOSCOPY/URETEROSCOPY/HOLMIUM LASER/STENT EXCHANGE;  Surgeon: Billey Co, MD;  Location: [facility] ORS;  Service: Urology;  Laterality: Left;  . ENDOVASCULAR REPAIR/STENT GRAFT N/A 05/18/2019   Procedure: ENDOVASCULAR REPAIR/STENT GRAFT;  Surgeon: Algernon Huxley, MD;  Location: [facility] INVASIVE CV LAB;; EVAR - 23 mm prox, 12 cm distal & x 18 cm length Gore Excluder Endoprosthesis Main body (via RFA distal to lowest Renal A); 22m x 14 cm L Contralateral Limb for L Iliac - extended with 8 mm x 6cm LifeStar stent; no additional R Iliac PTA needed.   . ESOPHAGOGASTRODUODENOSCOPY (EGD) WITH PROPOFOL N/A 09/15/2017    Procedure: ESOPHAGOGASTRODUODENOSCOPY (EGD) WITH PROPOFOL;  Surgeon: WLucilla Lame MD;  [facility];  Service: Endoscopy;  Laterality: N/A;  . FRACTURE SURGERY Right 03/19/2015   wrist  . FRACTURE SURGERY Left   . HIATAL HERNIA REPAIR    . ILIAC ARTERY STENT  12/15/2005   PTA and direct stenting rgt and lft common iliac arteries  . KYPHOPLASTY N/A 04/05/2019   Procedure: KYPHOPLASTY T12, L3, L4 L5;  Surgeon: MHessie Knows MD;  Location: [facility] ORS;  Service: Orthopedics;  Laterality: N/A;  . NM GATED MYOVIEW ([place]adeHX)  05/'19; 8/'20   a) [facility]: Low Risk - no ischemia or infarct.  EF > 65%. no RWMA;; b) ([facility]: Normal wall motion.  No ischemia or infarction.  . OPEN REDUCTION INTERNAL FIXATION (ORIF) DISTAL RADIAL FRACTURE Left 01/13/2019   Procedure: OPEN REDUCTION  INTERNAL FIXATION (ORIF) DISTAL  RADIAL FRACTURE - LEFT - SLEEP APNEA;  Surgeon: Hessie Knows, MD;  Location: [facility] ORS;  Service: Orthopedics;  Laterality: Left;  . ORIF ELBOW FRACTURE Right 01/01/2017   Procedure: OPEN REDUCTION INTERNAL FIXATION (ORIF) ELBOW/OLECRANON FRACTURE;  Surgeon: Hessie Knows, MD;  Location: [facility] ORS;  Service: Orthopedics;  Laterality: Right;  . ORIF WRIST FRACTURE Right 03/19/2015   Procedure: OPEN REDUCTION INTERNAL FIXATION (ORIF) WRIST FRACTURE;  Surgeon: Hessie Knows, MD;  Location: [facility] ORS;  Service: Orthopedics;  Laterality: Right;  . SHOULDER ARTHROSCOPY Right   . THORACIC AORTA - CAROTID ANGIOGRAM  October 2007   Dr. 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 TRANSTHORACIC ECHOCARDIOGRAM  February 2016   [facility]: Normal LV function. Dilated left atrium.  Domingo Dimes ECHOCARDIOGRAM  04/2019   [facility] April 20, 2019): EF 55-60 %.  Mild LVH.  Relatively normal valves.    There were no vitals filed for this visit.    Subjective Assessment - 02/28/20 0936    Subjective Patient  presents for evalution for spondylosis, balance, and DDD.    Pertinent History Patient is a pleasant 86 year old who was last seen by this PT on 12/22/19; since then has been admitted to the hospital and had other medical complications including: two surgeries for kidney stones, another one for his broken L foot. Was in a boot for 6 weeks (boot came off a week ago), and procedures for his back. Has been having hallucinations during the day and night time with increased rate frequently  Pain in back is associated with weakness, had facet joint injections. He has a history of kyphoplasty to T12, L3, L4, L5 performed in August 2020 by Dr. Rudene Christians. [facility]28[facility]2019 report was reviewed today. Acute compression fractures are noted at T12, L3, and L4. He has facet arthropathy at the lower lumbar levels. At L3-4 he has moderate central stenosis. PMH includes AAA, arthritis, atherosclerotic heart disease (s/p CABG), benign neoplasm of ascending/descending colon, COPD, CAD, dementia, dysrhythmia, falls, GERD, hemorrhoid, HOH, HTN, PAF, prostate cancer, osteoporosis, kidney stones, macular degeneration.    Limitations Walking;Standing;House hold activities;Other (comment);Lifting;Sitting    How long can you sit comfortably? 5 minutes    How long can you stand comfortably? needs a walker now, unsteady once standing    How long can you walk comfortably? uses a walker now; can walk in house.    Diagnostic tests MRI from [facility] dated 03/28/2019 report was reviewed today. Acute compression fractures are noted at T12, L3, and L4. He has facet arthropathy at the lower lumbar levels. At L3-4 he has moderate central stenosis.    Patient Stated Goals decrease pain and improve balance.    Currently in Pain? Yes    Pain Score 3     Pain Location Back    Pain Orientation Lower    Pain Descriptors / Indicators Aching;Throbbing    Pain Type Chronic pain    Pain Onset More  than a month ago    Pain Frequency Constant    Aggravating Factors  ambulation, prolonged standing    Pain Relieving Factors rest    Effect of Pain on Daily Activities limits mobility, occasional weakness making him lose balance    Multiple Pain Sites Yes    Pain Score 2    Pain Location Foot    Pain Orientation Left  Pain Descriptors / Indicators Aching    Pain Type Acute pain    Pain Onset More than a month ago    Pain Frequency Intermittent    Aggravating Factors  weightbearing    Pain Relieving Factors rest    Effect of Pain on Daily Activities limits mobility on limb                 Patient is a pleasant 86 year old who was last seen by this PT on 12/22/19; since then has been admitted to the hospital and had other medical complications including: two surgeries for kidney stones, another one for his broken L foot. Was in a boot for 6 weeks (boot came off a week ago), and procedures for his back. Has been having hallucinations during the day and night time with increased rate frequently  Pain in back is associated with weakness, had facet joint injections. He has a history of kyphoplasty to T12, L3, L4, L5 performed in August 2020 by Dr. Rudene Christians. [facility] dated 03/28/2019 report was reviewed today. Acute compression fractures are noted at T12, L3, and L4. He has facet arthropathy at the lower lumbar levels. At L3-4 he has moderate central stenosis. PMH includes AAA, arthritis, atherosclerotic heart disease (s/p CABG), benign neoplasm of ascending/descending colon, COPD, CAD, dementia, dysrhythmia, falls, GERD, hemorrhoid, HOH, HTN, PAF, prostate cancer, osteoporosis, kidney stones, macular degeneration.        PAIN: Back pain:  Worst pain: 5/10 Least pain: 2/10 Current pain level: 3/10    POSTURE: Seated: forward head rounded shoulder Standing: weight shift onto RLE, limited weight shift onto LLE  PROM/AROM:  AROM BLE: Limited hip extension  bilaterally   Trunk: Limited ability to assess trunk mobility due to balance deficits:   STRENGTH:  Graded on a 0-5 scale Muscle Group Left Right  Hip Flex 3+/5 4-/5  Hip Abd 2+/5 3/5  Hip Add 2+/5 3/5  Hip Ext 2+/5 3/5  Hip IR/ER 2+/5 3/5  Knee Flex 3+/5 3+/5  Knee Ext 4-/5 4/5  Ankle DF 3+/5 4/5  Ankle PF 3+/5 4/5    SENSATION:   BLE :  Impaired sensation LLE    COORDINATION: Finger to Nose: Dysmetric and past pointing; challenged with LUE>RUE Heel shin slide test: dysmetric LLE   .  SPECIAL TESTS: Facial nerve impaired, increased slurring compared to previus sessions.   FUNCTIONAL MOBILITY: Able to perform 2 sit to stands without UE support, unable to do more than that.  STS: left foot in front of the right. Reliance upon UE's.   BALANCE: Dynamic Sitting Balance  Normal Able to sit unsupported and weight shift across midline maximally   Good Able to sit unsupported and weight shift across midline moderately   Good-/Fair+ Able to sit unsupported and weight shift across midline minimally   Fair Minimal weight shifting ipsilateral/front, difficulty crossing midline   Fair- Reach to ipsilateral side and unable to weight shift x  Poor + Able to sit unsupported with min A and reach to ipsilateral side, unable to weight shift   Poor Able to sit unsupported with mod A and reach ipsilateral/front-can't cross midline     Standing Dynamic Balance  Normal Stand independently unsupported, able to weight shift and cross midline maximally   Good Stand independently unsupported, able to weight shift and cross midline moderately   Good-/Fair+ Stand independently unsupported, able to weight shift across midline minimally   Fair Stand independently unsupported, weight shift,  and reach ipsilaterally, loss of balance when crossing midline   Poor+ Able to stand with Min A and reach ipsilaterally, unable to weight shift x  Poor Able to stand with Mod A and minimally reach  ipsilaterally, unable to cross midline.     Static Sitting Balance  Normal Able to maintain balance against maximal resistance   Good Able to maintain balance against moderate resistance   Good-/Fair+ Accepts minimal resistance   Fair Able to sit unsupported without balance loss and without UE support x  Poor+ Able to maintain with Minimal assistance from individual or chair   Poor Unable to maintain balance-requires mod/max support from individual or chair     Static Standing Balance  Normal Able to maintain standing balance against maximal resistance   Good Able to maintain standing balance against moderate resistance   Good-/Fair+ Able to maintain standing balance against minimal resistance   Fair Able to stand unsupported without UE support and without LOB for 1-2 min x  Fair- Requires Min A and UE support to maintain standing without loss of balance   Poor+ Requires mod A and UE support to maintain standing without loss of balance   Poor Requires max A and UE support to maintain standing balance without loss       GAIT: Limited weight shift onto LLE, foot slap with limited terminal knee extension. Slight hip drop with r step resulting in drag of foot with RW.   OUTCOME MEASURES: TEST Outcome Interpretation  5 times sit<>stand 15.57 with heavy UE support and LLE in front of right  sec >60 yo, >15 sec indicates increased risk for falls  10 meter walk test   18.59 with walker  =0.54            m/s <1.0 m/s indicates increased risk for falls; limited community ambulator  FOTO 47.6%  Discharge predicted score 54%       Berg Balance Assessment 30/56  <36/56 (100% risk for falls), 37-45 (80% risk for falls); 46-51 (>50% risk for falls); 52-55 (lower risk <25% of falls)         Access Code: [email or web address] URL: [email or web address] Date: 02/28/2020 Prepared by: Janna Arch  Exercises Seated Heel Toe Raises - 1 x daily - 7 x weekly - 2 sets - 10 reps - 5 hold Seated  March - 1 x daily - 7 x weekly - 2 sets - 10 reps - 5 hold Seated Long Arc Quad - 1 x daily - 7 x weekly - 2 sets - 10 reps - 5 hold Sit to Stand with Counter Support - 1 x daily - 7 x weekly - 2 sets - 10 reps - 5 hold    Patient is a pleasant 86 year old male who presents to physical therapy for spondylosis, balance, and DDD. He is returning after multiple medical complications requiring him to undergo hospitalizations. Patient has had significant decline since last seen by this author with him requiring heavy reliance upon RW for ambulation, balance is very limited with BERG of 30/56, limited gait speed (0.54 m/s), and limited weight acceptance onto LLE. Patient would benefit from further skilled PT intervention to decrease pain and increase strength and stability for safer ADL performance and increased functional ability.      Objective measurements completed on examination: See above findings.               PT Education - 02/28/20 1406    Education provided Yes    Education  Details goals, POC, HEP    Person(s) Educated Patient    Methods Explanation;Demonstration;Tactile cues;Verbal cues;Handout    Comprehension Verbalized understanding;Returned demonstration;Verbal cues required;Tactile cues required            PT Short Term Goals - 02/28/20 1721      PT SHORT TERM GOAL #1   Title Patient will be independent in home exercise program to improve strength/mobility for better functional independence with ADLs.    Baseline 6/29: HEP given    Time 4    Period Weeks    Status New    Target Date 03/27/20      PT SHORT TERM GOAL #2   Title Patient will perform 5 sit to stand transfers without UE support to increase functional mobility for carryover to community activities.    Baseline 6/29; only able to perform 2    Time 4    Period Weeks    Status New    Target Date 03/27/20             PT Long Term Goals - 02/28/20 1723      PT LONG TERM GOAL #1   Title  Patient will increase FOTO score to equal to or greater than 54% to demonstrate statistically significant improvement in mobility and quality of life.    Baseline 6/29: 54%    Time 8    Period Weeks    Status New    Target Date 04/24/20      PT LONG TERM GOAL #2   Title Patient will demonstrate an improved Berg Balance Score of > 36 as to demonstrate improved balance with ADLs such as sitting/standing and transfer balance and reduced fall risk.    Baseline 6/29: 30/56    Time 8    Period Weeks    Status New    Target Date 04/24/20      PT LONG TERM GOAL #3   Title Patient will increase 10 meter walk test to >1.78ms as to improve gait speed for better community ambulation and to reduce fall risk.    Baseline 6/29: 0.554m with RW    Time 8    Period Weeks    Status New    Target Date 04/24/20      PT LONG TERM GOAL #4   Title Patient will increase BLE gross strength to 4+/5 as to improve functional strength for independent gait, increased standing tolerance and increased ADL ability.    Baseline 6/29: see note    Time 8    Period Weeks    Status New    Target Date 04/24/20      PT LONG TERM GOAL #5   Title Patient will be able to perform household work/ chores without increase in symptoms.    Baseline 6/29: unable to perform    Time 8    Period Weeks    Status New    Target Date 04/24/20                  Plan - 02/28/20 1719    Clinical Impression Statement Patient is a pleasant [age]ear old male who presents to physical therapy for spondylosis, balance, and DDD. He is returning after multiple medical complications requiring him to undergo hospitalizations. Patient has had significant decline since last seen by this author with him requiring heavy reliance upon RW for ambulation, balance is very limited with BERG of 30/56, limited gait speed (0.54 m/s), and limited weight acceptance onto LLE. Patient  would benefit from further skilled PT intervention to decrease pain  and increase strength and stability for safer ADL performance and increased functional ability.    Personal Factors and Comorbidities Age;Comorbidity 3+;Fitness;Past/Current Experience;Sex;Social Background;Time since onset of injury/illness/exacerbation;Transportation    Comorbidities AAA, arthritis, atherosclerotic heart disease, (s/p CABG), benign neoplasm of ascending/descending colon, COPD, CAD, dementia, dysrhythmia, frequent falls, GERD, hemorrhoid, HOH, HTN, PAF, prostate cancer.    Examination-Activity Limitations Bathing;Bend;Carry;Dressing;Continence;Stairs;Squat;Reach Overhead;Locomotion Level;Lift;Stand;Transfers;Toileting;Bed Mobility    Examination-Participation Restrictions Church;Cleaning;Community Activity;Driving;Interpersonal Relationship;Laundry;Volunteer;Shop;Personal Finances;Meal Prep;Yard Work    [employer] Evolving/Moderate complexity    Clinical Decision Making Moderate    Rehab Potential Fair    PT Frequency 2x / week    PT Duration 8 weeks    PT Treatment/Interventions ADLs/Self Care Home Management;Aquatic Therapy;Cryotherapy;Moist Heat;Traction;Ultrasound;Therapeutic activities;Functional mobility training;Stair training;Gait training;DME Instruction;Therapeutic exercise;Balance training;Neuromuscular re-education;Patient/family education;Manual techniques;Passive range of motion;Energy conservation;Vestibular;Taping;Biofeedback;Electrical Stimulation;Iontophoresis 77m/ml Dexamethasone;Dry needling;Visual/perceptual remediation/compensation    PT Next Visit Plan core stability, LE strength, balance    PT Home Exercise Plan see above    Consulted and Agree with Plan of Care Patient           Patient will benefit from skilled therapeutic intervention in order to improve the following deficits and impairments:  Abnormal gait, Cardiopulmonary status limiting activity, Decreased activity tolerance, Decreased balance, Decreased knowledge of  precautions, Decreased endurance, Decreased coordination, Decreased cognition, Decreased knowledge of use of DME, Decreased mobility, Difficulty walking, Decreased safety awareness, Decreased strength, Impaired flexibility, Impaired perceived functional ability, Impaired sensation, Postural dysfunction, Improper body mechanics, Pain, Impaired vision/preception, Decreased range of motion  Visit Diagnosis: Unsteadiness on feet  Muscle weakness (generalized)  Other abnormalities of gait and mobility  Chronic low back pain, unspecified back pain laterality, unspecified whether sciatica present     Problem List Patient Active Problem List   Diagnosis Date Noted  . Hematuria   . Acute kidney injury superimposed on CKD ([place]   . Sepsis due to gram-negative UTI (HR[place] 12/26/2019  . Left nephrolithiasis 12/26/2019  . Hydronephrosis, left 12/26/2019  . Status post abdominal aortic aneurysm (AAA) repair 04/15/2019  . Candidiasis 02/22/2019  . Malignant neoplasm of descending colon ([facility]   . Benign neoplasm of descending colon   . Benign neoplasm of transverse colon   . Do not intubate, cardiopulmonary resuscitation (CPR)-only code status 10/08/2018  . Prostate cancer (HCataract 02/25/2018  . Prostatic cyst 01/15/2018  . 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 ([place] 09/15/2017  . Blood in stool   . Abnormal feces   . Stricture and stenosis of esophagus   . Mild cognitive impairment 08/18/2017  . Senile purpura (HU[place] 04/29/2017  . Anemia 04/29/2017  . Frequent falls 04/29/2017  . Acute respiratory failure (HParis 12/17/2016  . Simple chronic bronchitis (HC[place] 12/17/2016  . Benign localized hyperplasia of prostate with urinary obstruction 07/15/2016  . Elevated PSA 07/15/2016  . Incomplete emptying of bladder 07/15/2016  . Urge incontinence 07/15/2016  . Hypothyroidism 04/25/2016  . Macular degeneration 04/25/2016  . Erectile dysfunction due to arterial insufficiency   . Ischemic heart disease with  chronotropic incompetence   . CN (constipation) 02/10/2015  . DD (diverticular disease) 02/10/2015  . Weakness 02/10/2015  . Lichen planus [id number] . Restless leg 02/10/2015  . 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 B12 deficiency 02/10/2015  . COPD (chronic obstructive pulmonary disease) (HN[place] 11/14/2014  . Post Inflammatory Lung Changes 11/14/2014  . Acute delirium 11/08/2014  . PAF (paroxysmal atrial fibrillation) (HCC) CHA2DS2-VASc = 4. AC = Eliquis   .  Insomnia 12/09/2013  . OSA on CPAP   . Dyspnea on exertion - -essentially resolved with BB dose reduction & wgt loss 03/29/2013  . Overweight (BMI 25.0-29.9) -- Wgt back up 01/17/2013  . Atherosclerotic heart disease of native coronary artery without angina pectoris - s/p CABG, occluded SVG-D1; patent LIMA-LAD, SVG-OM, SVG- RCA   . Essential hypertension   . Hyperlipidemia with target LDL less than 70   . Aorto-iliac disease ([place])   . BCC (basal cell carcinoma), eyelid 01/03/2013  . Allergic rhinitis 08/17/2009  . Adaptation reaction 05/28/2009  . Acid reflux 05/28/2009  . Arthritis, degenerative 05/28/2009  . 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 Carotid artery occlusion without infarction 06/01/2006  . PAD (peripheral artery disease) - bilateral common iliac stents 12/15/2005  . 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

## 2020-02-28 NOTE — Progress Notes (Signed)
Primary Care Physician: [name], MD  Primary Gastroenterologist:  Dr. Lucilla Lame  Chief Complaint  Patient presents with  . Diarrhea  . Rectal Pain    HPI: [name] is a 86 y.o. male here with alternating diarrhea and constipation.  The patient reports that he will have excessive gas with some bloating and then diarrhea.  He also reports that his sense of smell has been a problem and due to his decreased p.o. intake he has lost weight.  The patient drinks almond milk but is partner reports that he eats ice cream very frequently and has been doing it more recently.  The patient denies any black stools or bloody stools.  He also denies any nausea or vomiting.  His partner takes IBD guard for her irritable bowel syndrome and has been giving him this with some relief of his symptoms.  Past Medical History:  Diagnosis Date  . AAA (abdominal aortic aneurysm) ([place]) 05/2019   Relativley Rapid progression from June-Aug 2020 (4.6 - 5.4 cm): s/p EVAR (Dr. Lucky Cowboy [facility]): EVAR - 23 mm prox, 12 cm distal & x 18 cm length Gore Excluder Endoprosthesis Main body (via RFA distal to lowest Renal A); 48mm x 14 cm L Contralateral Limb for L Iliac - extended with 8 mm x 6cm LifeStar stent (post-dilated with 7 mm DEB).  Additional R Iliac PTA not required.   . Arthritis   . Basal cell carcinoma of eyelid 01/03/2013   2019 - R side of Nose  . Benign neoplasm of ascending colon   . Benign neoplasm of descending colon   . Bilateral iliac artery stenosis ([place]) 2007   Bilateral Iliac A stenting; extension of EVAR limbs into both Iliacs -- additional stent placed in L Iliac.  . Cancer ([place])    Skin cancer  . Colon polyps   . Complication of anesthesia    severe confusion agitation requiring hospital admission x 4 days  . Compression fracture of fourth lumbar vertebra ([place]) 04/2019   - s/p Kyphoplasty; T12, L3-5 [facility])  . COPD (chronic obstructive pulmonary disease) ([facility])    Centrilobular  Emphysema  . Coronary artery disease involving native coronary artery without angina pectoris 1998   - s/p CABG, occluded SVG-D1; patent LIMA-LAD, SVG-OM, SVG- RCA  . Dementia ([place])    memory loss - started noting late 2019  . Falls frequently    broke foot  . Fracture 2021   left foot  . GERD (gastroesophageal reflux disease)   . Hemorrhoid 02/10/2015  . History of hiatal hernia   . History of kidney stones   . HOH (hard of hearing)   . Hypertension   . Macular degeneration disease   . Osteoporosis   . PAF (paroxysmal atrial fibrillation) ([place])    on Eliquis for OAC - Rate Control   . Prostate cancer ([place])   . Prostate disorder   . Sciatic pain    Chronic  . Sleep apnea    cpap  . Temporary cerebral vascular dysfunction 02/10/2015   Had negative work-up.  July, 2012.  No medication changes, had forgot them that day, got dehydrated.   . Urinary incontinence     Current Outpatient Medications  Medication Sig Dispense Refill  . acetaminophen (TYLENOL) 500 MG tablet Take 500-1,000 mg by mouth every 6 (six) hours as needed for mild pain or moderate pain.     . Ascorbic Acid (VITAMIN C) 1000 MG tablet Take 1,000 mg by  mouth daily.    . Cyanocobalamin (VITAMIN B-12) 500 MCG SUBL Place 500 mcg under the tongue daily.     [name] donepezil (ARICEPT) 10 MG tablet Take 10 mg by mouth at bedtime.     [name] ELIQUIS 5 MG TABS tablet TAKE 1 TABLET BY MOUTH TWICE DAILY 180 tablet 1  . ezetimibe (ZETIA) 10 MG tablet Take 1 tablet (10 mg total) by mouth daily. 90 tablet 4  . finasteride (PROSCAR) 5 MG tablet TAKE ONE TABLET BY MOUTH EVERY DAY 90 tablet 3  . fluticasone (FLONASE) 50 MCG/ACT nasal spray PLACE TWO SPRAYS IN BOTH NOSRTILS EVERY DAY AS NEEDED FOR ALLERGIES 16 g 4  . lisinopril (ZESTRIL) 2.5 MG tablet Take 1 tablet (2.5 mg total) by mouth daily.    . Magnesium Oxide 500 MG TABS Take 500 mg by mouth every evening.    . metoprolol tartrate (LOPRESSOR) 25 MG tablet Take 1 tablet (25 mg total) by  mouth 2 (two) times daily. *OFFICE VISIT NEEDED FOR FURTHER REFILLS* 180 tablet 0  . Multiple Vitamins-Minerals (PRESERVISION AREDS 2 PO) Take 1 tablet by mouth 2 (two) times daily.     [name] MYRBETRIQ 50 MG TB24 tablet TAKE ONE TABLET BY MOUTH EVERY DAY 30 tablet 11  . nitroGLYCERIN (NITROSTAT) 0.4 MG SL tablet Place 1 tablet (0.4 mg total) under the tongue every 5 (five) minutes as needed for chest pain. 20 tablet 1  . pantoprazole (PROTONIX) 40 MG tablet TAKE ONE TABLET BY MOUTH EVERY DAY 90 tablet 1  . rosuvastatin (CRESTOR) 20 MG tablet TAKE ONE TABLET BY MOUTH EVERY DAY 90 tablet 4  . tamsulosin (FLOMAX) 0.4 MG CAPS capsule Take 1 capsule (0.4 mg total) by mouth every evening. 90 capsule 3  . umeclidinium-vilanterol (ANORO ELLIPTA) 62.5-25 MCG/INH AEPB Inhale 1 puff into the lungs daily.      No current facility-administered medications for this visit.    Allergies as of 02/28/2020 - Review Complete 02/28/2020  Allergen Reaction Noted  . Clonazepam Other (See Comments) 11/16/2014  . Codeine Other (See Comments) 01/13/2013  . Ropinirole Swelling 07/14/2019  . Hydrocodone  12/26/2016  . Niacin and related Other (See Comments) 01/13/2013  . Oxycodone Other (See Comments) 12/26/2016  . Tramadol  12/22/2016    ROS:  General: Negative for anorexia, weight loss, fever, chills, fatigue, weakness. ENT: Negative for hoarseness, difficulty swallowing , nasal congestion. CV: Negative for chest pain, angina, palpitations, dyspnea on exertion, peripheral edema.  Respiratory: Negative for dyspnea at rest, dyspnea on exertion, cough, sputum, wheezing.  GI: See history of present illness. GU:  Negative for dysuria, hematuria, urinary incontinence, urinary frequency, nocturnal urination.  Endo: Negative for unusual weight change.    Physical Examination:   BP (!) 90/50   Pulse 75   Ht 5\' 11"  (1.803 m)   Wt 191 lb 9.6 oz (86.9 kg)   BMI 26.72 kg/m   General: Well-nourished, well-developed in  no acute distress.  Eyes: No icterus. Conjunctivae pink. Extremities: No lower extremity edema. No clubbing or deformities. Neuro: Alert and oriented x 3.  Grossly intact. Skin: Warm and dry, no jaundice.   Psych: Alert and cooperative, normal mood and affect.  Labs:    Imaging Studies: No results found.  Assessment and Plan:   [name] is a 86 y.o. y/o male who comes in today with a history of alternating diarrhea and constipation.  The patient likely has lactose intolerance and has been told to try and avoid milk  products for 1 week and see if his symptoms get better.  If his symptoms do not improve the patient has been told to contact me.  He has been explained that due to the alternating diarrhea constipation it is likely either environmental source such as stress or a food that he is eating that can be the cause of his diarrhea.  If the avoidance of milk does help his symptoms he has been told to get Lactaid and to take it prior to eating dairy products.  The patient has been explained the plan and agrees with it.     Lucilla Lame, MD. Marval Regal    Note: This dictation was prepared with Dragon dictation along with smaller phrase technology. Any transcriptional errors that result from this process are unintentional.

## 2020-02-28 NOTE — Telephone Encounter (Signed)
Order printed, please fax.

## 2020-02-28 NOTE — Telephone Encounter (Signed)
Ms. [name] called stating that pt received the wrong walker and that she is not sure what to do with it. She is requesting a call back with more information. Please advise.

## 2020-02-29 NOTE — Telephone Encounter (Signed)
Order faxed to [phone] 

## 2020-03-02 DIAGNOSIS — R200 Anesthesia of skin: Secondary | ICD-10-CM | POA: Insufficient documentation

## 2020-03-02 DIAGNOSIS — R202 Paresthesia of skin: Secondary | ICD-10-CM | POA: Insufficient documentation

## 2020-03-06 ENCOUNTER — Other Ambulatory Visit: Payer: Self-pay

## 2020-03-06 ENCOUNTER — Ambulatory Visit: Payer: Medicare Other | Attending: Nurse Practitioner

## 2020-03-06 DIAGNOSIS — M6281 Muscle weakness (generalized): Secondary | ICD-10-CM | POA: Insufficient documentation

## 2020-03-06 DIAGNOSIS — G8929 Other chronic pain: Secondary | ICD-10-CM | POA: Insufficient documentation

## 2020-03-06 DIAGNOSIS — R2689 Other abnormalities of gait and mobility: Secondary | ICD-10-CM | POA: Insufficient documentation

## 2020-03-06 DIAGNOSIS — M545 Low back pain, unspecified: Secondary | ICD-10-CM

## 2020-03-06 DIAGNOSIS — R2681 Unsteadiness on feet: Secondary | ICD-10-CM | POA: Insufficient documentation

## 2020-03-06 NOTE — Therapy (Signed)
[place] MAIN [facility] SERVICES [address] [place], [place], [address] Phone: [phone]   Fax:  [phone]  Physical Therapy Treatment  Patient Details  Name: [name] MRN: [id number] Date of Birth: Jan 21, 1934 Referring Provider (PT): Meeler, Sherren Kerns FNP   Encounter Date: 03/06/2020   PT End of Session - 03/06/20 1307    Visit Number 2    Number of Visits 16    Date for PT Re-Evaluation 04/24/20    Authorization Type 2/10 eval 6/29    PT Start Time 1300    PT Stop Time 1344    PT Time Calculation (min) 44 min    Equipment Utilized During Treatment Gait belt    Activity Tolerance Patient tolerated treatment well;Patient limited by fatigue;Patient limited by pain    Behavior During Therapy WFL for tasks assessed/performed           Past Medical History:  Diagnosis Date  . AAA (abdominal aortic aneurysm) ([place]) 05/2019   [place]ativley Rapid progression from June-Aug 2020 (4.6 - 5.4 cm): s/p EVAR (Dr. Lucky Cowboy [facility]): EVAR - 23 mm prox, 12 cm distal & x 18 cm length Gore Excluder Endoprosthesis Main body (via RFA distal to lowest Renal A); 31m x 14 cm L Contralateral Limb for L Iliac - extended with 8 mm x 6cm LifeStar stent (post-dilated with 7 mm DEB).  Additional R Iliac PTA not required.   . Arthritis   . Basal cell carcinoma of eyelid 01/03/2013   2019 - R side of Nose  . Benign neoplasm of ascending colon   . Benign neoplasm of descending colon   . Bilateral iliac artery stenosis ([place] 2007   Bilateral Iliac A stenting; extension of EVAR limbs into both Iliacs -- additional stent placed in L Iliac.  . Cancer ([place]    Skin cancer  . Colon polyps   . Complication of anesthesia    severe confusion agitation requiring hospital admission x 4 days  . Compression fracture of fourth lumbar vertebra ([place] 04/2019   - s/p Kyphoplasty; T12, L3-5 ([facility]  . COPD (chronic obstructive pulmonary disease) (HCC)    Centrilobular Emphysema   . Coronary artery disease involving native coronary artery without angina pectoris 1998   - s/p CABG, occluded SVG-D1; patent LIMA-LAD, SVG-OM, SVG- RCA  . Dementia ([place]    memory loss - started noting late 2019  . Falls frequently    broke foot  . Fracture 2021   left foot  . GERD (gastroesophageal reflux disease)   . Hemorrhoid 02/10/2015  . History of hiatal hernia   . History of kidney stones   . HOH (hard of hearing)   . Hypertension   . Macular degeneration disease   . Osteoporosis   . PAF (paroxysmal atrial fibrillation) ([place]    on Eliquis for OAC - Rate Control   . Prostate cancer ([place]   . Prostate disorder   . Sciatic pain    Chronic  . Sleep apnea    cpap  . Temporary cerebral vascular dysfunction 02/10/2015   Had negative work-up.  July, 2012.  No medication changes, had forgot them that day, got dehydrated.   . Urinary incontinence     Past Surgical History:  Procedure Laterality Date  . ABDOMINAL AORTIC ENDOVASCULAR STENT GRAFT Bilateral 05/18/2019   Procedure: ABDOMINAL AORTIC ENDOVASCULAR STENT GRAFT;  Surgeon: DAlgernon Huxley MD;  Location: [facility] ORS;  Service: Vascular;  Laterality: Bilateral;  . ANKLE SURGERY  Left    ORIF   . Arch aortogram and carotid aortogram  06/02/2006   Dr Oneida Alar did surgery  . BASAL CELL CARCINOMA EXCISION  04/2016   Dermatology  . CARDIAC CATHETERIZATION  01/30/1998    (Dr. Linard Millers): Native LAD & mRCA CTO. 90% D1. Mod Cx. SVG-D1 CTO. SVG-RCA & SVG-OM along with LIMA-LAD patent;  LV NORMAL Fxn  . CAROTID ENDARTERECTOMY Left Oct. 29, 2007   Dr. Oneida Alar:  . CATARACT EXTRACTION W/PHACO Right 03/05/2017   Procedure: CATARACT EXTRACTION PHACO AND INTRAOCULAR LENS PLACEMENT (IOC);  Surgeon: Leandrew Koyanagi, MD;  Location: [facility] ORS;  Service: Ophthalmology;  Laterality: Right;  [place] 00:58.5 AP% 10.6 CDE 6.20 Fluid Pack lot # [id number]  . CATARACT EXTRACTION W/PHACO Left 04/15/2017   Procedure: CATARACT EXTRACTION PHACO AND INTRAOCULAR  LENS PLACEMENT ([place])  Left;  Surgeon: Leandrew Koyanagi, MD;  Location: [place];  Service: Ophthalmology;  Laterality: Left;  . COLONOSCOPY WITH PROPOFOL N/A 09/15/2017   Procedure: COLONOSCOPY WITH PROPOFOL;  Surgeon: Lucilla Lame, MD;  Location: [facility] ENDOSCOPY;  Service: Endoscopy;  Laterality: N/A;  . COLONOSCOPY WITH PROPOFOL N/A 10/26/2018   Procedure: COLONOSCOPY WITH PROPOFOL;  Surgeon: Lucilla Lame, MD;  Location: [facility] ENDOSCOPY;  Service: Endoscopy;  Laterality: N/A;  . CORONARY ARTERY BYPASS GRAFT  1998   LIMA-LAD, SVG-OM, SVG-RPDA, SVG-DIAG  . CPET / MET  12/2012   Mild chronotropic incompetence - read 82% of predicted; also reduced effort; peak VO2 15.7 / 75% (did not reach Max effort) -- suggested ischemic response in last 1.5 minutes of exercise. Normal pulmonary function on PFTs but poor response to  . CYSTOSCOPY W/ RETROGRADES Left 01/13/2020   Procedure: CYSTOSCOPY WITH RETROGRADE PYELOGRAM;  Surgeon: Billey Co, MD;  Location: [facility] ORS;  Service: Urology;  Laterality: Left;  . CYSTOSCOPY WITH STENT PLACEMENT Left 12/26/2019   Procedure: CYSTOSCOPY WITH STENT PLACEMENT;  Surgeon: Billey Co, MD;  Location: [facility] ORS;  Service: Urology;  Laterality: Left;  . CYSTOSCOPY/URETEROSCOPY/HOLMIUM LASER/STENT PLACEMENT Left 01/13/2020   Procedure: CYSTOSCOPY/URETEROSCOPY/HOLMIUM LASER/STENT EXCHANGE;  Surgeon: Billey Co, MD;  Location: [facility] ORS;  Service: Urology;  Laterality: Left;  . ENDOVASCULAR REPAIR/STENT GRAFT N/A 05/18/2019   Procedure: ENDOVASCULAR REPAIR/STENT GRAFT;  Surgeon: Algernon Huxley, MD;  Location: [facility] INVASIVE CV LAB;; EVAR - 23 mm prox, 12 cm distal & x 18 cm length Gore Excluder Endoprosthesis Main body (via RFA distal to lowest Renal A); 63m x 14 cm L Contralateral Limb for L Iliac - extended with 8 mm x 6cm LifeStar stent; no additional R Iliac PTA needed.   . ESOPHAGOGASTRODUODENOSCOPY (EGD) WITH PROPOFOL N/A 09/15/2017   Procedure:  ESOPHAGOGASTRODUODENOSCOPY (EGD) WITH PROPOFOL;  Surgeon: WLucilla Lame MD;  [facility]  Service: Endoscopy;  Laterality: N/A;  . FRACTURE SURGERY Right 03/19/2015   wrist  . FRACTURE SURGERY Left   . HIATAL HERNIA REPAIR    . ILIAC ARTERY STENT  12/15/2005   PTA and direct stenting rgt and lft common iliac arteries  . KYPHOPLASTY N/A 04/05/2019   Procedure: KYPHOPLASTY T12, L3, L4 L5;  Surgeon: MHessie Knows MD;  Location: [facility] ORS;  Service: Orthopedics;  Laterality: N/A;  . NM GATED MYOVIEW ([place])  05/'19; 8/'20   a) [facility]: Low Risk - no ischemia or infarct.  EF > 65%. no RWMA;; b) ([facility]: Normal wall motion.  No ischemia or infarction.  . OPEN REDUCTION INTERNAL FIXATION (ORIF) DISTAL RADIAL FRACTURE Left 01/13/2019   Procedure: OPEN REDUCTION  INTERNAL FIXATION (ORIF) DISTAL  RADIAL FRACTURE - LEFT - SLEEP APNEA;  Surgeon: Hessie Knows, MD;  Location: [facility] ORS;  Service: Orthopedics;  Laterality: Left;  . ORIF ELBOW FRACTURE Right 01/01/2017   Procedure: OPEN REDUCTION INTERNAL FIXATION (ORIF) ELBOW/OLECRANON FRACTURE;  Surgeon: Hessie Knows, MD;  Location: [facility] ORS;  Service: Orthopedics;  Laterality: Right;  . ORIF WRIST FRACTURE Right 03/19/2015   Procedure: OPEN REDUCTION INTERNAL FIXATION (ORIF) WRIST FRACTURE;  Surgeon: Hessie Knows, MD;  Location: [facility] ORS;  Service: Orthopedics;  Laterality: Right;  . SHOULDER ARTHROSCOPY Right   . THORACIC AORTA - CAROTID ANGIOGRAM  October 2007   Dr. 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 TRANSTHORACIC ECHOCARDIOGRAM  February 2016   [facility]: Normal LV function. Dilated left atrium.  Domingo Dimes ECHOCARDIOGRAM  04/2019   [facility] April 20, 2019): EF 55-60 %.  Mild LVH.  Relatively normal valves.    There were no vitals filed for this visit.   Subjective Assessment - 03/06/20 1305    Subjective Patient presents with his  new rollator. Reports no falls since last session. Has been walking once since his last session. His aide helps him with his HEP.    Pertinent History Patient is a pleasant 86 year old who was last seen by this PT on 12/22/19; since then has been admitted to the hospital and had other medical complications including: two surgeries for kidney stones, another one for his broken L foot. Was in a boot for 6 weeks (boot came off a week ago), and procedures for his back. Has been having hallucinations during the day and night time with increased rate frequently  Pain in back is associated with weakness, had facet joint injections. He has a history of kyphoplasty to T12, L3, L4, L5 performed in August 2020 by Dr. Rudene Christians. MRI from [facility] dated 03/28/2019 report was reviewed today. Acute compression fractures are noted at T12, L3, and L4. He has facet arthropathy at the lower lumbar levels. At L3-4 he has moderate central stenosis. PMH includes AAA, arthritis, atherosclerotic heart disease (s/p CABG), benign neoplasm of ascending/descending colon, COPD, CAD, dementia, dysrhythmia, falls, GERD, hemorrhoid, HOH, HTN, PAF, prostate cancer, osteoporosis, kidney stones, macular degeneration.    Limitations Walking;Standing;House hold activities;Other (comment);Lifting;Sitting    How long can you sit comfortably? 5 minutes    How long can you stand comfortably? needs a walker now, unsteady once standing    How long can you walk comfortably? uses a walker now; can walk in house.    Diagnostic tests MRI from [facility] dated 03/28/2019 report was reviewed today. Acute compression fractures are noted at T12, L3, and L4. He has facet arthropathy at the lower lumbar levels. At L3-4 he has moderate central stenosis.    Patient Stated Goals decrease pain and improve balance.    Currently in Pain? Yes    Pain Score 2     Pain Location Back    Pain Orientation Lower    Pain Descriptors /  Indicators Aching    Pain Type Chronic pain    Pain Onset More than a month ago    Pain Frequency Constant                   Neuromuscular Re-education: verbal cueing for task orientation and body mechanics for stability.   -red light green light game: for initiation and termination of  ambulation while maintaining COM for stability and decreased episodes of LOB 2x90 feet;  airex pad: one foot on 6" step, modified tandem stance 2x 30 seconds.     Vitals monitored throughout session during rest periods to ensure O2 saturation return to functional range prior to initiation of each exercise   Therex:  Standing with # 3 ankle weight: CGA for stability   -Hip extension with bilateral upper extremity support, cueing for neutral hip alignment, upright posture for optimal muscle recruitment, and sequencing, 10x each LE,  -Hip abduction with bilateral upper extremity support, cueing for neutral foot alignment for correct muscle activation, 10x each LE -Hip flexion with bilateral upper extremity support, cueing for body mechanics, speed of muscle recruitment for optimal strengthening and stabilization 10x each LE     Seated with #3 ankle weights  -Seated marches with upright posture, back away from back of chair for abdominal/trunk activation/stabilization, 10x each LE -Seated LAQ with 3 second holds, 10x each LE, cueing for muscle activation and sequencing for neutral alignment   -sit to stand 10x with weighed ball (2000 gr)  -weighted ball from one side of body to the other 10x with focus on abdominal activation   Seated Y raise for core activation 10x     Pt educated throughout session about proper posture and technique with exercises. Improved exercise technique, movement at target joints, use of target muscles after min to mod verbal, visual, tactile cues.                      PT Education - 03/06/20 1306    Education provided Yes    Education Details exercise  technique, body mechanics    Person(s) Educated Patient    Methods Explanation;Demonstration;Tactile cues;Verbal cues    Comprehension Verbalized understanding;Returned demonstration;Verbal cues required;Tactile cues required            PT Short Term Goals - 02/28/20 1721      PT SHORT TERM GOAL #1   Title Patient will be independent in home exercise program to improve strength/mobility for better functional independence with ADLs.    Baseline 6/29: HEP given    Time 4    Period Weeks    Status New    Target Date 03/27/20      PT SHORT TERM GOAL #2   Title Patient will perform 5 sit to stand transfers without UE support to increase functional mobility for carryover to community activities.    Baseline 6/29; only able to perform 2    Time 4    Period Weeks    Status New    Target Date 03/27/20             PT Long Term Goals - 02/28/20 1723      PT LONG TERM GOAL #1   Title Patient will increase FOTO score to equal to or greater than 54% to demonstrate statistically significant improvement in mobility and quality of life.    Baseline 6/29: 54%    Time 8    Period Weeks    Status New    Target Date 04/24/20      PT LONG TERM GOAL #2   Title Patient will demonstrate an improved Berg Balance Score of > 36 as to demonstrate improved balance with ADLs such as sitting/standing and transfer balance and reduced fall risk.    Baseline 6/29: 30/56    Time 8    Period Weeks    Status New  Target Date 04/24/20      PT LONG TERM GOAL #3   Title Patient will increase 10 meter walk test to >1.873ms as to improve gait speed for better community ambulation and to reduce fall risk.    Baseline 6/29: 0.558m with RW    Time 8    Period Weeks    Status New    Target Date 04/24/20      PT LONG TERM GOAL #4   Title Patient will increase BLE gross strength to 4+/5 as to improve functional strength for independent gait, increased standing tolerance and increased ADL ability.     Baseline 6/29: see note    Time 8    Period Weeks    Status New    Target Date 04/24/20      PT LONG TERM GOAL #5   Title Patient will be able to perform household work/ chores without increase in symptoms.    Baseline 6/29: unable to perform    Time 8    Period Weeks    Status New    Target Date 04/24/20                 Plan - 03/07/20 1034    Clinical Impression Statement Patient is fatigued upon presentation to session. Discussion with significant other and patient revealed sleeping issues and was educated on need to follow up with physician for patient safety. Strengthening and stability intervention tolerated however patient requires frequent seated rest breaks. Patient would benefit from further skilled PT intervention to decrease pain and increase strength and stability for safer ADL performance and increased functional ability.    Personal Factors and Comorbidities Age;Comorbidity 3+;Fitness;Past/Current Experience;Sex;Social Background;Time since onset of injury/illness/exacerbation;Transportation    Comorbidities AAA, arthritis, atherosclerotic heart disease, (s/p CABG), benign neoplasm of ascending/descending colon, COPD, CAD, dementia, dysrhythmia, frequent falls, GERD, hemorrhoid, HOH, HTN, PAF, prostate cancer.    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    Rehab Potential Fair    PT Frequency 2x / week    PT Duration 8 weeks    PT Treatment/Interventions ADLs/Self Care Home Management;Aquatic Therapy;Cryotherapy;Moist Heat;Traction;Ultrasound;Therapeutic activities;Functional mobility training;Stair training;Gait  training;DME Instruction;Therapeutic exercise;Balance training;Neuromuscular re-education;Patient/family education;Manual techniques;Passive range of motion;Energy conservation;Vestibular;Taping;Biofeedback;Electrical Stimulation;Iontophoresis 73m75ml Dexamethasone;Dry needling;Visual/perceptual remediation/compensation    PT Next Visit Plan core stability, LE strength, balance    PT Home Exercise Plan see above    Consulted and Agree with Plan of Care Patient           Patient will benefit from skilled therapeutic intervention in order to improve the following deficits and impairments:  Abnormal gait, Cardiopulmonary status limiting activity, Decreased activity tolerance, Decreased balance, Decreased knowledge of precautions, Decreased endurance, Decreased coordination, Decreased cognition, Decreased knowledge of use of DME, Decreased mobility, Difficulty walking, Decreased safety awareness, Decreased strength, Impaired flexibility, Impaired perceived functional ability, Impaired sensation, Postural dysfunction, Improper body mechanics, Pain, Impaired vision/preception, Decreased range of motion  Visit Diagnosis: Unsteadiness on feet  Muscle weakness (generalized)  Other abnormalities of gait and mobility  Chronic low back pain, unspecified back pain laterality, unspecified whether sciatica present     Problem List Patient Active Problem List   Diagnosis Date Noted  . Hematuria   . Acute kidney injury superimposed on CKD ([place] . Sepsis due to gram-negative UTI ([place]CC[place]. [place]4/26/2021  . Left nephrolithiasis 12/26/2019  . Hydronephrosis, left 12/26/2019  . Status  post abdominal aortic aneurysm (AAA) repair 04/15/2019  . Candidiasis 02/22/2019  . Malignant neoplasm of descending colon ([place])   . Benign neoplasm of descending colon   . Benign neoplasm of transverse colon   . Do not intubate, cardiopulmonary resuscitation (CPR)-only code status 10/08/2018  . Prostate cancer ([place])  02/25/2018  . Prostatic cyst 01/15/2018  . 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 ([place]) 09/15/2017  . Blood in stool   . Abnormal feces   . Stricture and stenosis of esophagus   . Mild cognitive impairment 08/18/2017  . Senile purpura ([place]) 04/29/2017  . Anemia 04/29/2017  . Frequent falls 04/29/2017  . Acute respiratory failure ([place]) 12/17/2016  . Simple chronic bronchitis ([place]) 12/17/2016  . Benign localized hyperplasia of prostate with urinary obstruction 07/15/2016  . Elevated PSA 07/15/2016  . Incomplete emptying of bladder 07/15/2016  . Urge incontinence 07/15/2016  . Hypothyroidism 04/25/2016  . Macular degeneration 04/25/2016  . Erectile dysfunction due to arterial insufficiency   . Ischemic heart disease with chronotropic incompetence   . CN (constipation) 02/10/2015  . DD (diverticular disease) 02/10/2015  . Weakness 02/10/2015  . Lichen planus 18/48/5927  . Restless leg 02/10/2015  . 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 B12 deficiency 02/10/2015  . COPD (chronic obstructive pulmonary disease) ([place]) 11/14/2014  . Post Inflammatory Lung Changes 11/14/2014  . Acute delirium 11/08/2014  . PAF (paroxysmal atrial fibrillation) (HCC) CHA2DS2-VASc = 4. AC = Eliquis   . Insomnia 12/09/2013  . OSA on CPAP   . Dyspnea on exertion - -essentially resolved with BB dose reduction & wgt loss 03/29/2013  . Overweight (BMI 25.0-29.9) -- Wgt back up 01/17/2013  . Atherosclerotic heart disease of native coronary artery without angina pectoris - s/p CABG, occluded SVG-D1; patent LIMA-LAD, SVG-OM, SVG- RCA   . Essential hypertension   . Hyperlipidemia with target LDL less than 70   . Aorto-iliac disease ([place])   . BCC (basal cell carcinoma), eyelid 01/03/2013  . Allergic rhinitis 08/17/2009  . Adaptation reaction 05/28/2009  . Acid reflux 05/28/2009  . Arthritis, degenerative 05/28/2009  . 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 Carotid artery occlusion without infarction  06/01/2006  . PAD (peripheral artery disease) - bilateral common iliac stents 12/15/2005  . Atherosclerotic heart disease of artery bypass graft 09/01/1997   Janna Arch, PT, DPT   03/07/2020, 10:36 AM  [place] MAIN [facility] SERVICES [address] [place], [place], [address] Phone: [phone]   Fax:  [phone]  Name: [name] MRN: [id number] Date of Birth: 24-Jan-1934

## 2020-03-12 ENCOUNTER — Ambulatory Visit: Payer: Medicare Other

## 2020-03-12 ENCOUNTER — Encounter: Payer: Self-pay | Admitting: Family Medicine

## 2020-03-12 ENCOUNTER — Ambulatory Visit (INDEPENDENT_AMBULATORY_CARE_PROVIDER_SITE_OTHER): Payer: Medicare Other | Admitting: Family Medicine

## 2020-03-12 ENCOUNTER — Other Ambulatory Visit: Payer: Self-pay

## 2020-03-12 VITALS — BP 122/80 | HR 68 | Temp 97.1°F | Resp 16 | Wt 186.6 lb

## 2020-03-12 DIAGNOSIS — R6889 Other general symptoms and signs: Secondary | ICD-10-CM | POA: Diagnosis not present

## 2020-03-12 DIAGNOSIS — I2510 Atherosclerotic heart disease of native coronary artery without angina pectoris: Secondary | ICD-10-CM | POA: Diagnosis not present

## 2020-03-12 DIAGNOSIS — J432 Centrilobular emphysema: Secondary | ICD-10-CM

## 2020-03-12 DIAGNOSIS — G4733 Obstructive sleep apnea (adult) (pediatric): Secondary | ICD-10-CM

## 2020-03-12 DIAGNOSIS — R630 Anorexia: Secondary | ICD-10-CM | POA: Diagnosis not present

## 2020-03-12 DIAGNOSIS — R251 Tremor, unspecified: Secondary | ICD-10-CM | POA: Diagnosis not present

## 2020-03-12 DIAGNOSIS — G3184 Mild cognitive impairment, so stated: Secondary | ICD-10-CM

## 2020-03-12 DIAGNOSIS — Z9989 Dependence on other enabling machines and devices: Secondary | ICD-10-CM

## 2020-03-12 NOTE — Progress Notes (Signed)
Established patient visit   Patient: [name]   DOB: June 17, 1934   86 y.o. Male  MRN: [id number] Visit Date: 03/12/2020  Today's healthcare provider: Lelon Huh, MD   Chief Complaint  Patient presents with  . Tremors   Subjective    HPI  Tremors: Patient complains of tremors in the left arm. Patient reports that tremors have been present for one month and states that it has been intermittent. Patient denies any pain in arm or difficulty lifting or holding objects. Patients partner states that patient had previously seen neurologist Dr. [name] but states that patient had a bad experience and was diagnosed with carpale tunnel  Neck mass: Patient complain of a knot on his neck, patient is present in office today with his partner who states that patient went to [facility] and seen by Dr. Velda Shell from [facility] who thought patient had a stone in salvia gland. Patient as seen by Dr. Velda Shell 02/21/20, patients wife states that he was referred to Dr. Tami Ribas on 02/27/20 who has ordered a CT scan for patient to have tomorrow ( 03/13/20).  Partner reports since patient was last seen in office he has been to ED for kidney stones and had stent placed, she states that he later returned back to hospital and had stones removed by lasers.  Partner also would like to discuss patient sleep patterns as well stating that patient has been having more vivid dreams lately like nightmares and patient seems more disoriented.   He also complains of poor appetite for the last few months.  Wt Readings from Last 3 Encounters:  03/12/20 186 lb 9.6 oz (84.6 kg)  02/28/20 191 lb 9.6 oz (86.9 kg)  02/13/20 199 lb 1.2 oz (90.3 kg)   Doesn't feel like eating. No nausea or vomiting. No changes in bowels.   Medications: Outpatient Medications Prior to Visit  Medication Sig  . acetaminophen (TYLENOL) 500 MG tablet Take 500-1,000 mg by mouth every 6 (six) hours as needed for mild pain or moderate pain.     . Ascorbic Acid (VITAMIN C) 1000 MG tablet Take 1,000 mg by mouth daily.  . Cyanocobalamin (VITAMIN B-12) 500 MCG SUBL Place 500 mcg under the tongue daily.   [name] donepezil (ARICEPT) 10 MG tablet Take 10 mg by mouth at bedtime.   [name] ELIQUIS 5 MG TABS tablet TAKE 1 TABLET BY MOUTH TWICE DAILY  . ezetimibe (ZETIA) 10 MG tablet Take 1 tablet (10 mg total) by mouth daily.  . finasteride (PROSCAR) 5 MG tablet TAKE ONE TABLET BY MOUTH EVERY DAY  . fluticasone (FLONASE) 50 MCG/ACT nasal spray PLACE TWO SPRAYS IN BOTH NOSRTILS EVERY DAY AS NEEDED FOR ALLERGIES  . lisinopril (ZESTRIL) 2.5 MG tablet Take 1 tablet (2.5 mg total) by mouth daily.  . Magnesium Oxide 500 MG TABS Take 500 mg by mouth every evening.  . metoprolol tartrate (LOPRESSOR) 25 MG tablet Take 1 tablet (25 mg total) by mouth 2 (two) times daily. *OFFICE VISIT NEEDED FOR FURTHER REFILLS*  . Multiple Vitamins-Minerals (PRESERVISION AREDS 2 PO) Take 1 tablet by mouth 2 (two) times daily.   [name] MYRBETRIQ 50 MG TB24 tablet TAKE ONE TABLET BY MOUTH EVERY DAY  . nitroGLYCERIN (NITROSTAT) 0.4 MG SL tablet Place 1 tablet (0.4 mg total) under the tongue every 5 (five) minutes as needed for chest pain.  . pantoprazole (PROTONIX) 40 MG tablet TAKE ONE TABLET BY MOUTH EVERY DAY  . rosuvastatin (CRESTOR) 20 MG tablet  TAKE ONE TABLET BY MOUTH EVERY DAY  . tamsulosin (FLOMAX) 0.4 MG CAPS capsule Take 1 capsule (0.4 mg total) by mouth every evening.  . umeclidinium-vilanterol (ANORO ELLIPTA) 62.5-25 MCG/INH AEPB Inhale 1 puff into the lungs daily.    No facility-administered medications prior to visit.    Review of Systems    Objective    BP 122/80   Pulse 68   Temp (!) 97.1 F (36.2 C) (Oral)   Resp 16   Wt 186 lb 9.6 oz (84.6 kg)   SpO2 95%   BMI 26.03 kg/m    Physical Exam   General: Appearance:     Overweight male in no acute distress  Eyes:    PERRL, conjunctiva/corneas clear, EOM's intact       Lungs:     Mild bilateral expiratory  wheezes, no rales or rhonchi, respirations unlabored  Heart:    Normal heart rate. Irregularly irregular rhythm. No murmurs, rubs, or gallops.   MS:   All extremities are intact.   Neurologic:   Awake, alert, oriented x 3. No apparent focal neurological           defect. Occasional resting tremor of left hand noted with mild cogwheeling. .       No results found for any visits on 03/12/20.  Assessment & Plan     1. Vivid dreams Counseled that this has been associated with betablockers such as metoprolol, but would be reluctant to change it at this point. They did seem to start around the time he started Aricept. Will put that on hold for the next two weeks to see if there is any correlation. He does have appt with Dr. Manuella Ghazi in August.   2. Anorexia Has been associated with Aricept. Will put that on hole. If not improved within two weeks then will pursue additional work up.   3. Tremor Is to follow up with Dr. Manuella Ghazi for further evaluation in August.   4. OSA on CPAP   5. Mild cognitive impairment May be having some adverse effects from Aricept as above, which is being put on hold briefly.    6. Centrilobular emphysema (HCC) Stable.    Follow up 3-4 months.       The entirety of the information documented in the History of Present Illness, Review of Systems and Physical Exam were personally obtained by me. Portions of this information were initially documented by the CMA and reviewed by me for thoroughness and accuracy.       Lelon Huh, MD  [facility] [phone] (phone) [phone] (fax)  [place]

## 2020-03-12 NOTE — Patient Instructions (Signed)
.   Please review the attached list of medications and notify my office if there are any errors.   . Put the donepezil (Aricept) on hold for 2 weeks to see if the vivid dreams improve.

## 2020-03-13 ENCOUNTER — Other Ambulatory Visit: Payer: Self-pay

## 2020-03-13 ENCOUNTER — Ambulatory Visit
Admission: RE | Admit: 2020-03-13 | Discharge: 2020-03-13 | Disposition: A | Discharge status: Home / Self Care | Payer: Medicare Other | Source: Ambulatory Visit | Attending: Unknown Physician Specialty | Admitting: Unknown Physician Specialty

## 2020-03-13 DIAGNOSIS — K115 Sialolithiasis: Secondary | ICD-10-CM | POA: Diagnosis not present

## 2020-03-13 LAB — POCT I-STAT CREATININE: Creatinine, Ser: 1.3 mg/dL — ABNORMAL HIGH (ref 0.61–1.24)

## 2020-03-13 MED ORDER — IOHEXOL 300 MG/ML  SOLN
75.0000 mL | Freq: Once | INTRAMUSCULAR | Status: AC | PRN
  Administered 2020-03-13: 75 mL via INTRAVENOUS

## 2020-03-14 ENCOUNTER — Ambulatory Visit: Payer: Medicare Other

## 2020-03-14 DIAGNOSIS — M6281 Muscle weakness (generalized): Secondary | ICD-10-CM

## 2020-03-14 DIAGNOSIS — R2681 Unsteadiness on feet: Secondary | ICD-10-CM

## 2020-03-14 DIAGNOSIS — R2689 Other abnormalities of gait and mobility: Secondary | ICD-10-CM

## 2020-03-14 NOTE — Therapy (Signed)
[place] MAIN [facility] SERVICES [address] [place], [place], [address] Phone: [phone]   Fax:  [phone]  Patient Details  Name: [name] MRN: [id number] Date of Birth: Jan 01, 1934 Referring Provider:  Harvest Dark, FNP  Encounter Date: 03/14/2020   Patient arrived, vitals taken at start of session due to patient stating he just took his meds right before session rather than the hour prior he normally does.   vitals at start of session: 66/41 second attempt 64/43     Patient's aide given the vitals, educated on monitoring patient. Allowing patient to nap and then re-take in an hour due to just taking BP pills prior to session to allow them time to "kick in". If BP continues to be low in an hour they verbalized understanding of calling physician and/or going to ED.   Janna Arch, PT, DPT   03/14/2020, 9:04 AM  [place] MAIN [facility] SERVICES [address] [place], [place], [address] Phone: [phone]   Fax:  [phone]

## 2020-03-19 ENCOUNTER — Other Ambulatory Visit: Payer: Self-pay

## 2020-03-19 ENCOUNTER — Ambulatory Visit: Payer: Medicare Other

## 2020-03-19 DIAGNOSIS — R2681 Unsteadiness on feet: Secondary | ICD-10-CM | POA: Diagnosis not present

## 2020-03-19 DIAGNOSIS — M6281 Muscle weakness (generalized): Secondary | ICD-10-CM

## 2020-03-19 DIAGNOSIS — R2689 Other abnormalities of gait and mobility: Secondary | ICD-10-CM

## 2020-03-19 NOTE — Therapy (Signed)
[place] MAIN [facility] SERVICES [address] [name], [place], [address] Phone: [phone]   Fax:  [phone]  Physical Therapy Treatment  Patient Details  Name: [name] MRN: [id number] Date of Birth: 05/09/1934 Referring Provider (PT): Meeler, Sherren Kerns FNP   Encounter Date: 03/19/2020   PT End of Session - 03/19/20 0936    Visit Number 3    Number of Visits 16    Date for PT Re-Evaluation 04/24/20    Authorization Type 3/10 eval 6/29    PT Start Time 0929    PT Stop Time 1014    PT Time Calculation (min) 45 min    Equipment Utilized During Treatment Gait belt    Activity Tolerance Patient tolerated treatment well;Patient limited by fatigue    Behavior During Therapy [facility] for tasks assessed/performed           Past Medical History:  Diagnosis Date  . AAA (abdominal aortic aneurysm) ([place]) 05/2019   Relativley Rapid progression from June-Aug 2020 (4.6 - 5.4 cm): s/p EVAR (Dr. Lucky Cowboy [facility]): EVAR - 23 mm prox, 12 cm distal & x 18 cm length Gore Excluder Endoprosthesis Main body (via RFA distal to lowest Renal A); 65m x 14 cm L Contralateral Limb for L Iliac - extended with 8 mm x 6cm LifeStar stent (post-dilated with 7 mm DEB).  Additional R Iliac PTA not required.   . Arthritis   . Basal cell carcinoma of eyelid 01/03/2013   2019 - R side of Nose  . Benign neoplasm of ascending colon   . Benign neoplasm of descending colon   . Bilateral iliac artery stenosis ([place] 2007   Bilateral Iliac A stenting; extension of EVAR limbs into both Iliacs -- additional stent placed in L Iliac.  . Cancer ([place]    Skin cancer  . Colon polyps   . Complication of anesthesia    severe confusion agitation requiring hospital admission x 4 days  . Compression fracture of fourth lumbar vertebra ([place] 04/2019   - [facility]  . COPD (chronic obstructive pulmonary disease) (HCC)    Centrilobular Emphysema  . Coronary artery  disease involving native coronary artery without angina pectoris 1998   - s/p CABG, occluded SVG-D1; patent LIMA-LAD, SVG-OM, SVG- RCA  . Dementia ([place]    memory loss - started noting late 2019  . Falls frequently    broke foot  . Fracture 2021   left foot  . GERD (gastroesophageal reflux disease)   . Hemorrhoid 02/10/2015  . History of hiatal hernia   . History of kidney stones   . HOH (hard of hearing)   . Hypertension   . Macular degeneration disease   . Osteoporosis   . PAF (paroxysmal atrial fibrillation) ([place]    on Eliquis for OAC - Rate Control   . Prostate cancer ([place]   . Prostate disorder   . Sciatic pain    Chronic  . Sleep apnea    cpap  . Temporary cerebral vascular dysfunction 02/10/2015   Had negative work-up.  July, 2012.  No medication changes, had forgot them that day, got dehydrated.   . Urinary incontinence     Past Surgical History:  Procedure Laterality Date  . ABDOMINAL AORTIC ENDOVASCULAR STENT GRAFT Bilateral 05/18/2019   Procedure: ABDOMINAL AORTIC ENDOVASCULAR STENT GRAFT;  Surgeon: DAlgernon Huxley MD;  Location: [facility] ORS;  Service: Vascular;  Laterality: Bilateral;  . ANKLE SURGERY Left  ORIF   . Arch aortogram and carotid aortogram  06/02/2006   Dr Oneida Alar did surgery  . BASAL CELL CARCINOMA EXCISION  04/2016   Dermatology  . CARDIAC CATHETERIZATION  01/30/1998    (Dr. Linard Millers): Native LAD & mRCA CTO. 90% D1. Mod Cx. SVG-D1 CTO. SVG-RCA & SVG-OM along with LIMA-LAD patent;  LV NORMAL Fxn  . CAROTID ENDARTERECTOMY Left Oct. 29, 2007   Dr. Oneida Alar:  . CATARACT EXTRACTION W/PHACO Right 03/05/2017   Procedure: CATARACT EXTRACTION PHACO AND INTRAOCULAR LENS PLACEMENT (IOC);  Surgeon: Leandrew Koyanagi, MD;  Location: [facility] ORS;  Service: Ophthalmology;  Laterality: Right;  [place] 00:58.5 AP% 10.6 CDE 6.20 Fluid Pack lot # [id number]  . CATARACT EXTRACTION W/PHACO Left 04/15/2017   Procedure: CATARACT EXTRACTION PHACO AND INTRAOCULAR LENS PLACEMENT ([place])   Left;  Surgeon: Leandrew Koyanagi, MD;  Location: [place];  Service: Ophthalmology;  Laterality: Left;  . COLONOSCOPY WITH PROPOFOL N/A 09/15/2017   Procedure: COLONOSCOPY WITH PROPOFOL;  Surgeon: Lucilla Lame, MD;  Location: [facility] ENDOSCOPY;  Service: Endoscopy;  Laterality: N/A;  . COLONOSCOPY WITH PROPOFOL N/A 10/26/2018   Procedure: COLONOSCOPY WITH PROPOFOL;  Surgeon: Lucilla Lame, MD;  Location: [facility] ENDOSCOPY;  Service: Endoscopy;  Laterality: N/A;  . CORONARY ARTERY BYPASS GRAFT  1998   LIMA-LAD, SVG-OM, SVG-RPDA, SVG-DIAG  . CPET / MET  12/2012   Mild chronotropic incompetence - read 82% of predicted; also reduced effort; peak VO2 15.7 / 75% (did not reach Max effort) -- suggested ischemic response in last 1.5 minutes of exercise. Normal pulmonary function on PFTs but poor response to  . CYSTOSCOPY W/ RETROGRADES Left 01/13/2020   Procedure: CYSTOSCOPY WITH RETROGRADE PYELOGRAM;  Surgeon: Billey Co, MD;  Location: [facility] ORS;  Service: Urology;  Laterality: Left;  . CYSTOSCOPY WITH STENT PLACEMENT Left 12/26/2019   Procedure: CYSTOSCOPY WITH STENT PLACEMENT;  Surgeon: Bil[place] Co, MD;  Location: [facility] ORS;  Service: Urology;  Laterality: Left;  . CYSTOSCOPY/URETEROSCOPY/HOLMIUM LASER/STENT PLACEMENT Left 01/13/2020   Procedure: CYSTOSCOPY/URETEROSCOPY/HOLMIUM LASER/STENT EXCHANGE;  Surgeon: [place] Co, MD;  Location: [facility] ORS;  Service: Urology;  Laterality: Left;  . ENDOVASCULAR REPAIR/STENT GRAFT N/A 05/18/2019   Procedure: ENDOVASCULAR REPAIR/STENT GRAFT;  Surgeon: Algernon Huxley, MD;  Location: [facility] INVASIVE CV LAB;; EVAR - 23 mm prox, 12 cm distal & x 18 cm length Gore Excluder Endoprosthesis Main body (via RFA distal to lowest Renal A); 35m x 14 cm L Contralateral Limb for L Iliac - extended with 8 mm x 6cm LifeStar stent; no additional R Iliac PTA needed.   . ESOPHAGOGASTRODUODENOSCOPY (EGD) WITH PROPOFOL N/A 09/15/2017   Procedure: ESOPHAGOGASTRODUODENOSCOPY  (EGD) WITH PROPOFOL;  Surgeon: WLucilla Lame MD;  [facility];  Service: Endoscopy;  Laterality: N/A;  . FRACTURE SURGERY Right 03/19/2015   wrist  . FRACTURE SURGERY Left   . HIATAL HERNIA REPAIR    . ILIAC ARTERY STENT  12/15/2005   PTA and direct stenting rgt and lft common iliac arteries  . KYPHOPLASTY N/A 04/05/2019   Procedure: KYPHOPLASTY T12, L3, L4 L5;  Surgeon: MHessie Knows MD;  Location: [facility] ORS;  Service: Orthopedics;  Laterality: N/A;  . NM GATED MYOVIEW ([place])  05/'19; 8/'20   a) [facility]: Low Risk - no ischemia or infarct.  EF > 65%. no RWMA;; b) ([facility]: Normal wall motion.  No ischemia or infarction.  . OPEN REDUCTION INTERNAL FIXATION (ORIF) DISTAL RADIAL FRACTURE Left 01/13/2019   Procedure: OPEN REDUCTION INTERNAL FIXATION (ORIF) DISTAL  RADIAL FRACTURE - LEFT - SLEEP APNEA;  Surgeon: Hessie Knows, MD;  Location: [facility] ORS;  Service: Orthopedics;  Laterality: Left;  . ORIF ELBOW FRACTURE Right 01/01/2017   Procedure: OPEN REDUCTION INTERNAL FIXATION (ORIF) ELBOW/OLECRANON FRACTURE;  Surgeon: Hessie Knows, MD;  Location: [facility] ORS;  Service: Orthopedics;  Laterality: Right;  . ORIF WRIST FRACTURE Right 03/19/2015   Procedure: OPEN REDUCTION INTERNAL FIXATION (ORIF) WRIST FRACTURE;  Surgeon: Hessie Knows, MD;  Location: [facility] ORS;  Service: Orthopedics;  Laterality: Right;  . SHOULDER ARTHROSCOPY Right   . THORACIC AORTA - CAROTID ANGIOGRAM  October 2007   Dr. 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 TRANSTHORACIC ECHOCARDIOGRAM  February 2016   [facility]: Normal LV function. Dilated left atrium.  Domingo Dimes ECHOCARDIOGRAM  04/2019   [facility] April 20, 2019): EF 55-60 %.  Mild LVH.  Relatively normal valves.    There were no vitals filed for this visit.   Subjective Assessment - 03/19/20 0935    Subjective Patient reports compliance with HEP. His blood  pressure evened out once he napped after last time he  was at session. No falls since last session.    Pertinent History Patient is a pleasant 86 year old who was last seen by this PT on 12/22/19; since then has been admitted to the hospital and had other medical complications including: two surgeries for kidney stones, another one for his broken L foot. Was in a boot for 6 weeks (boot came off a week ago), and procedures for his back. Has been having hallucinations during the day and night time with increased rate frequently  Pain in back is associated with weakness, had facet joint injections. He has a history of kyphoplasty to T12, L3, L4, L5 performed in August 2020 by Dr. Rudene Christians. MRI from [facility] dated 03/28/2019 report was reviewed today. Acute compression fractures are noted at T12, L3, and L4. He has facet arthropathy at the lower lumbar levels. At L3-4 he has moderate central stenosis. PMH includes AAA, arthritis, atherosclerotic heart disease (s/p CABG), benign neoplasm of ascending/descending colon, COPD, CAD, dementia, dysrhythmia, falls, GERD, hemorrhoid, HOH, HTN, PAF, prostate cancer, osteoporosis, kidney stones, macular degeneration.    Limitations Walking;Standing;House hold activities;Other (comment);Lifting;Sitting    How long can you sit comfortably? 5 minutes    How long can you stand comfortably? needs a walker now, unsteady once standing    How long can you walk comfortably? uses a walker now; can walk in house.    Diagnostic tests [facility] from [facility] dated 03/28/2019 report was reviewed today. Acute compression fractures are noted at T12, L3, and L4. He has facet arthropathy at the lower lumbar levels. At L3-4 he has moderate central stenosis.    Patient Stated Goals decrease pain and improve balance.    Currently in Pain? No/denies                  vitals at start of session: 123/64      Neuromuscular Re-education: verbal cueing  for task orientation and body mechanics for stability.      airex pad: one foot on 6" step, modified tandem stance 2x 30 seconds.     airex pad 6" step toe taps SUE support 10x each LE  airex pad sandwhich 6" step, lateral step up/down BUE support 10x each direction ; max cueing due to poor spatial awareness.  step over orange hurdle 10x each LE; BUE support   Static stand throw balls at target x 3 minutes, close CGA  Therex:  Nustep Lvl 3 RPM> 70 for cardiovascular support x 3 minutes  Standing with # 3 ankle weight: CGA for stability   -Hip extension with bilateral upper extremity support, cueing for neutral hip alignment, upright posture for optimal muscle recruitment, and sequencing, 10x each LE,  -Hip abduction with bilateral upper extremity support, cueing for neutral foot alignment for correct muscle activation, 10x each LE -Hip flexion with bilateral upper extremity support, cueing for body mechanics, speed of muscle recruitment for optimal strengthening and stabilization 10x each LE     Seated with #3 ankle weights  -Seated marches with upright posture, back away from back of chair for abdominal/trunk activation/stabilization, 10x each LE -Seated LAQ with 3 second holds, 10x each LE, cueing for muscle activation and sequencing for neutral alignment   -sit to stand 10x with weighed ball (2000 gr)  -seated weighted ball from one side of body to the other 10x with focus on abdominal activation       Pt educated throughout session about proper posture and technique with exercises. Improved exercise technique, movement at target joints, use of target muscles after min to mod verbal, visual, tactile cues.    Vitals monitored throughout session during rest periods to ensure O2 saturation return to functional range prior to initiation of each exercise              PT Education - 03/19/20 0932    Education provided Yes    Education Details exercise technique, body  mechanics    Person(s) Educated Patient    Methods Explanation;Demonstration;Tactile cues;Verbal cues    Comprehension Verbalized understanding;Returned demonstration;Verbal cues required;Tactile cues required            PT Short Term Goals - 02/28/20 1721      PT SHORT TERM GOAL #1   Title Patient will be independent in home exercise program to improve strength/mobility for better functional independence with ADLs.    Baseline 6/29: HEP given    Time 4    Period Weeks    Status New    Target Date 03/27/20      PT SHORT TERM GOAL #2   Title Patient will perform 5 sit to stand transfers without UE support to increase functional mobility for carryover to community activities.    Baseline 6/29; only able to perform 2    Time 4    Period Weeks    Status New    Target Date 03/27/20             PT Long Term Goals - 02/28/20 1723      PT LONG TERM GOAL #1   Title Patient will increase FOTO score to equal to or greater than 54% to demonstrate statistically significant improvement in mobility and quality of life.    Baseline 6/29: 54%    Time 8    Period Weeks    Status New    Target Date 04/24/20      PT LONG TERM GOAL #2   Title Patient will demonstrate an improved Berg Balance Score of > 36 as to demonstrate improved balance with ADLs such as sitting/standing and transfer balance and reduced fall risk.    Baseline 6/29: 30/56    Time 8    Period Weeks    Status New    Target Date 04/24/20  PT LONG TERM GOAL #3   Title Patient will increase 10 meter walk test to >1.3ms as to improve gait speed for better community ambulation and to reduce fall risk.    Baseline 6/29: 0.539m with RW    Time 8    Period Weeks    Status New    Target Date 04/24/20      PT LONG TERM GOAL #4   Title Patient will increase BLE gross strength to 4+/5 as to improve functional strength for independent gait, increased standing tolerance and increased ADL ability.    Baseline 6/29: see  note    Time 8    Period Weeks    Status New    Target Date 04/24/20      PT LONG TERM GOAL #5   Title Patient will be able to perform household work/ chores without increase in symptoms.    Baseline 6/29: unable to perform    Time 8    Period Weeks    Status New    Target Date 04/24/20                 Plan - 03/19/20 0955    Clinical Impression Statement Patient is very challenged with spatial awareness and sequencing of foot placement. Fatigue with prolonged standing requires frequent seated rest breaks throughout session however patient remains highly motivated. Patient would benefit from further skilled PT intervention to decrease pain and increase strength and stability for safer ADL performance and increased functional ability    Personal Factors and Comorbidities Age;Comorbidity 3+;Fitness;Past/Current Experience;Sex;Social Background;Time since onset of injury/illness/exacerbation;Transportation    Comorbidities AAA, arthritis, atherosclerotic heart disease, (s/p CABG), benign neoplasm of ascending/descending colon, COPD, CAD, dementia, dysrhythmia, frequent falls, GERD, hemorrhoid, HOH, HTN, PAF, prostate cancer.    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    Rehab Potential Fair    PT Frequency 2x / week    PT Duration 8 weeks    PT Treatment/Interventions ADLs/Self Care Home Management;Aquatic Therapy;Cryotherapy;Moist Heat;Traction;Ultrasound;Therapeutic activities;Functional mobility training;Stair training;Gait training;DME Instruction;Therapeutic exercise;Balance training;Neuromuscular re-education;Patient/family  education;Manual techniques;Passive range of motion;Energy conservation;Vestibular;Taping;Biofeedback;Electrical Stimulation;Iontophoresis '4mg'$ /ml Dexamethasone;Dry needling;Visual/perceptual remediation/compensation    PT Next Visit Plan core stability, LE strength, balance    PT Home Exercise Plan see above    Consulted and Agree with Plan of Care Patient           Patient will benefit from skilled therapeutic intervention in order to improve the following deficits and impairments:  Abnormal gait, Cardiopulmonary status limiting activity, Decreased activity tolerance, Decreased balance, Decreased knowledge of precautions, Decreased endurance, Decreased coordination, Decreased cognition, Decreased knowledge of use of DME, Decreased mobility, Difficulty walking, Decreased safety awareness, Decreased strength, Impaired flexibility, Impaired perceived functional ability, Impaired sensation, Postural dysfunction, Improper body mechanics, Pain, Impaired vision/preception, Decreased range of motion  Visit Diagnosis: Unsteadiness on feet  Muscle weakness (generalized)  Other abnormalities of gait and mobility     Problem List Patient Active Problem List   Diagnosis Date Noted  . Hematuria   . Acute kidney injury superimposed on CKD ([place]  . Sepsis due to gram-negative UTI (HC[place]illips[place]04/26/2021  . Left nephrolithiasis 12/26/2019  . Hydronephrosis, left 12/26/2019  . Status post abdominal aortic aneurysm (AAA) repair 04/15/2019  . Candidiasis 02/22/2019  . Malignant neoplasm of descending colon ([place]  . Benign neoplasm of descending colon   . Benign neoplasm of transverse  colon   . Do not intubate, cardiopulmonary resuscitation (CPR)-only code status 10/08/2018  . Prostate cancer ([place]) 02/25/2018  . Prostatic cyst 01/15/2018  . 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 ([place]) 09/15/2017  . Blood in stool   . Abnormal feces   . Stricture and stenosis of esophagus   . Mild cognitive impairment  08/18/2017  . Senile purpura ([place]) 04/29/2017  . Anemia 04/29/2017  . Frequent falls 04/29/2017  . Simple chronic bronchitis ([place]) 12/17/2016  . Benign localized hyperplasia of prostate with urinary obstruction 07/15/2016  . Elevated PSA 07/15/2016  . Incomplete emptying of bladder 07/15/2016  . Urge incontinence 07/15/2016  . Hypothyroidism 04/25/2016  . Macular degeneration 04/25/2016  . Erectile dysfunction due to arterial insufficiency   . Ischemic heart disease with chronotropic incompetence   . CN (constipation) 02/10/2015  . DD (diverticular disease) 02/10/2015  . Weakness 02/10/2015  . Lichen planus 18/98/4210  . Restless leg 02/10/2015  . 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 B12 deficiency 02/10/2015  . COPD (chronic obstructive pulmonary disease) ([place]) 11/14/2014  . Post Inflammatory Lung Changes 11/14/2014  . Acute delirium 11/08/2014  . PAF (paroxysmal atrial fibrillation) (HCC) CHA2DS2-VASc = 4. AC = Eliquis   . Insomnia 12/09/2013  . OSA on CPAP   . Dyspnea on exertion - -essentially resolved with BB dose reduction & wgt loss 03/29/2013  . Overweight (BMI 25.0-29.9) -- Wgt back up 01/17/2013  . Atherosclerotic heart disease of native coronary artery without angina pectoris - s/p CABG, occluded SVG-D1; patent LIMA-LAD, SVG-OM, SVG- RCA   . Essential hypertension   . Hyperlipidemia with target LDL less than 70   . Aorto-iliac disease ([place])   . BCC (basal cell carcinoma), eyelid 01/03/2013  . Allergic rhinitis 08/17/2009  . Adaptation reaction 05/28/2009  . Acid reflux 05/28/2009  . Arthritis, degenerative 05/28/2009  . 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 Carotid artery occlusion without infarction 06/01/2006  . PAD (peripheral artery disease) - bilateral common iliac stents 12/15/2005  . Atherosclerotic heart disease of artery bypass graft 09/01/1997   Janna Arch, PT, DPT   03/19/2020, 10:14 AM  [place]  MAIN [facility] SERVICES [address] [place], [place], [address] Phone: [phone]   Fax:  [phone]  Name: PETERSON MATHEY MRN: [id number] Date of Birth: September 19, 1933

## 2020-03-21 ENCOUNTER — Other Ambulatory Visit: Payer: Self-pay

## 2020-03-21 ENCOUNTER — Ambulatory Visit (INDEPENDENT_AMBULATORY_CARE_PROVIDER_SITE_OTHER): Payer: Medicare Other | Admitting: Urology

## 2020-03-21 ENCOUNTER — Encounter: Payer: Self-pay | Admitting: Urology

## 2020-03-21 VITALS — BP 94/57 | HR 71 | Ht 70.0 in | Wt 183.0 lb

## 2020-03-21 DIAGNOSIS — C61 Malignant neoplasm of prostate: Secondary | ICD-10-CM

## 2020-03-21 DIAGNOSIS — N200 Calculus of kidney: Secondary | ICD-10-CM | POA: Diagnosis not present

## 2020-03-21 DIAGNOSIS — N3281 Overactive bladder: Secondary | ICD-10-CM | POA: Diagnosis not present

## 2020-03-21 NOTE — Progress Notes (Signed)
   03/21/2020 3:40 PM   [name] July 11, 1934 [id number]  Reason for visit: Follow up nephrolithiasis, prostate cancer, urinary symptoms  HPI: I saw Mr. Freiberger and his partner in urology clinic today for follow-up.  Briefly, he is an 86 year old male who is followed by Dr. Bernardo Heater for high risk prostate cancer treated with radiation as well as overactive bladder symptoms.  I met him in April 2021 when he presented with altered mental status and infected left ureteral stone, and he underwent urgent ureteral stent placement and admission at that time.  He then underwent definitive management with me on 01/13/2020 with left ureteroscopy, laser lithotripsy and stent placement, and his stent was removed on 5/26.  His stone was significantly impacted.  His follow-up today was to review renal ultrasound results and discuss urinary symptoms, however he has not had the renal ultrasound performed yet.  He reports his urinary symptoms are doing well and he has some frequency but minimal incontinence and is voiding with a strong stream.  He denies any flank pain or gross hematuria.  He has some low back pain bilaterally and some right-sided groin pain.  Recommended completing renal ultrasound, will call with results Continue Myrbetriq and Flomax for urinary symptoms Keep scheduled follow-up with Dr. Bernardo Heater in March 2022 for PSA surveillance after prostate cancer treatment  Billey Co, MD  [facility] [address] [place], [place] [address] [phone]

## 2020-03-23 ENCOUNTER — Ambulatory Visit: Payer: Medicare Other

## 2020-03-23 ENCOUNTER — Other Ambulatory Visit: Payer: Self-pay

## 2020-03-23 DIAGNOSIS — R2689 Other abnormalities of gait and mobility: Secondary | ICD-10-CM

## 2020-03-23 DIAGNOSIS — G8929 Other chronic pain: Secondary | ICD-10-CM

## 2020-03-23 DIAGNOSIS — R2681 Unsteadiness on feet: Secondary | ICD-10-CM

## 2020-03-23 DIAGNOSIS — M545 Low back pain, unspecified: Secondary | ICD-10-CM

## 2020-03-23 DIAGNOSIS — M6281 Muscle weakness (generalized): Secondary | ICD-10-CM

## 2020-03-23 NOTE — Therapy (Signed)
[place] MAIN [facility] SERVICES [address] [place], [place], [address] Phone: [phone]   Fax:  [phone]  Physical Therapy Treatment  Patient Details  Name: [name] MRN: [id number] Date of Birth: 19-Jul-1934 Referring Provider (PT): Meeler, Sherren Kerns FNP   Encounter Date: 03/23/2020   PT End of Session - 03/23/20 1147    Visit Number 4    Number of Visits 16    Date for PT Re-Evaluation 04/24/20    Authorization Type 4/10 eval 6/29    PT Start Time 0945    PT Stop Time 1028    PT Time Calculation (min) 43 min    Equipment Utilized During Treatment Gait belt    Activity Tolerance Patient tolerated treatment well;Patient limited by fatigue    Behavior During Therapy WFL for tasks assessed/performed           Past Medical History:  Diagnosis Date  . AAA (abdominal aortic aneurysm) ([place]) 05/2019   [place] Rapid progression from June-Aug 2020 (4.6 - 5.4 cm): s/p EVAR (Dr. [facility]): EVAR - 23 mm prox, 12 cm distal & x 18 cm length Gore Excluder Endoprosthesis Main body (via RFA distal to lowest Renal A); 33m x 14 cm L Contralateral Limb for L Iliac - extended with 8 mm x 6cm LifeStar stent (post-dilated with 7 mm DEB).  Additional R Iliac PTA not required.   . Arthritis   . Basal cell carcinoma of eyelid 01/03/2013   2019 - R side of Nose  . Benign neoplasm of ascending colon   . Benign neoplasm of descending colon   . Bilateral [place]ac artery stenosis ([place] 2007   Bilateral Iliac A stenting; extension of EVAR limbs into both Iliacs -- additional stent placed in L Iliac.  . Cancer ([place]    Skin cancer  . Colon polyps   . Complication of anesthesia    severe confusion agitation requiring hospital admission x 4 days  . Compression fracture of fourth lumbar vertebra ([place] 04/2019   - s/p Kyphoplasty; T12, L3-5 ([facility]  . COPD (chronic obstructive pulmonary disease) (HCC)    Centrilobular Emphysema  . Coronary artery  disease involving native coronary artery without angina pectoris 1998   - s/p CABG, occluded SVG-D1; patent LIMA-LAD, SVG-OM, SVG- RCA  . Dementia ([place]    memory loss - started noting late 2019  . Falls frequently    broke foot  . Fracture 2021   left foot  . GERD (gastroesophageal reflux disease)   . Hemorrhoid 02/10/2015  . History of hiatal hernia   . History of kidney stones   . HOH (hard of hearing)   . Hypertension   . Macular degeneration disease   . Osteoporosis   . PAF (paroxysmal atrial fibrillation) ([place]    on Eliquis for OAC - Rate Control   . Prostate cancer ([place]   . Prostate disorder   . Sciatic pain    Chronic  . Sleep apnea    cpap  . Temporary cerebral vascular dysfunction 02/10/2015   Had negative work-up.  July, 2012.  No medication changes, had forgot them that day, got dehydrated.   . Urinary incontinence     Past Surgical History:  Procedure Laterality Date  . ABDOMINAL AORTIC ENDOVASCULAR STENT GRAFT Bilateral 05/18/2019   Procedure: ABDOMINAL AORTIC ENDOVASCULAR STENT GRAFT;  Surgeon: DAlgernon Huxley MD;  Location: [facility] ORS;  Service: Vascular;  Laterality: Bilateral;  . ANKLE SURGERY Left  ORIF   . Arch aortogram and carotid aortogram  06/02/2006   Dr Oneida Alar did surgery  . BASAL CELL CARCINOMA EXCISION  04/2016   Dermatology  . CARDIAC CATHETERIZATION  01/30/1998    (Dr. Linard Millers): Native LAD & mRCA CTO. 90% D1. Mod Cx. SVG-D1 CTO. SVG-RCA & SVG-OM along with LIMA-LAD patent;  LV NORMAL Fxn  . CAROTID ENDARTERECTOMY Left Oct. 29, 2007   Dr. Oneida Alar:  . CATARACT EXTRACTION W/PHACO Right 03/05/2017   Procedure: CATARACT EXTRACTION PHACO AND INTRAOCULAR LENS PLACEMENT (IOC);  Surgeon: Leandrew Koyanagi, MD;  Location: [facility] ORS;  Service: Ophthalmology;  Laterality: Right;  [place] 00:58.5 AP% 10.6 CDE 6.20 Fluid Pack lot # [id number]  . CATARACT EXTRACTION W/PHACO Left 04/15/2017   Procedure: CATARACT EXTRACTION PHACO AND INTRAOCULAR LENS PLACEMENT ([place])   Left;  Surgeon: Leandrew Koyanagi, MD;  Location: [place];  Service: Ophthalmology;  Laterality: Left;  . COLONOSCOPY WITH PROPOFOL N/A 09/15/2017   Procedure: COLONOSCOPY WITH PROPOFOL;  Surgeon: Lucilla Lame, MD;  Location: [facility] ENDOSCOPY;  Service: Endoscopy;  Laterality: N/A;  . COLONOSCOPY WITH PROPOFOL N/A 10/26/2018   Procedure: COLONOSCOPY WITH PROPOFOL;  Surgeon: Lucilla Lame, MD;  Location: [facility] ENDOSCOPY;  Service: Endoscopy;  Laterality: N/A;  . CORONARY ARTERY BYPASS GRAFT  1998   LIMA-LAD, SVG-OM, SVG-RPDA, SVG-DIAG  . CPET / MET  12/2012   Mild chronotropic incompetence - read 82% of predicted; also reduced effort; peak VO2 15.7 / 75% (did not reach Max effort) -- suggested ischemic response in last 1.5 minutes of exercise. Normal pulmonary function on PFTs but poor response to  . CYSTOSCOPY W/ RETROGRADES Left 01/13/2020   Procedure: CYSTOSCOPY WITH RETROGRADE PYELOGRAM;  Surgeon: Billey Co, MD;  Location: [facility] ORS;  Service: Urology;  Laterality: Left;  . CYSTOSCOPY WITH STENT PLACEMENT Left 12/26/2019   Procedure: CYSTOSCOPY WITH STENT PLACEMENT;  Surgeon: Billey Co, MD;  Location: [facility] ORS;  Service: Urology;  Laterality: Left;  . CYSTOSCOPY/URETEROSCOPY/HOLMIUM LASER/STENT PLACEMENT Left 01/13/2020   Procedure: CYSTOSCOPY/URETEROSCOPY/HOLMIUM LASER/STENT EXCHANGE;  Surgeon: Billey Co, MD;  Location: [facility] ORS;  Service: Urology;  Laterality: Left;  . ENDOVASCULAR REPAIR/STENT GRAFT N/A 05/18/2019   Procedure: ENDOVASCULAR REPAIR/STENT GRAFT;  Surgeon: Algernon Huxley, MD;  Location: [facility] INVASIVE [facility] LAB;; EVAR - 23 mm prox, 12 cm distal & x 18 cm length Gore Excluder Endoprosthesis Main body (via RFA distal to lowest Renal A); 35m x 14 cm L Contralateral Limb for L Iliac - extended with 8 mm x 6cm LifeStar stent; no additional R Iliac PTA needed.   . ESOPHAGOGASTRODUODENOSCOPY (EGD) WITH PROPOFOL N/A 09/15/2017   Procedure: ESOPHAGOGASTRODUODENOSCOPY  (EGD) WITH PROPOFOL;  Surgeon: WLucilla Lame MD;  [facility];  Service: Endoscopy;  Laterality: N/A;  . FRACTURE SURGERY Right 03/19/2015   wrist  . FRACTURE SURGERY Left   . HIATAL HERNIA REPAIR    . ILIAC ARTERY STENT  12/15/2005   PTA and direct stenting rgt and lft common iliac arteries  . KYPHOPLASTY N/A 04/05/2019   Procedure: KYPHOPLASTY T12, L3, L4 L5;  Surgeon: MHessie Knows MD;  Location: [facility] ORS;  Service: Orthopedics;  Laterality: N/A;  . NM GATED MYOVIEW ([place])  05/'19; 8/'20   a) [facility]: Low Risk - no ischemia or infarct.  EF > 65%. no RWMA;; b) ([facility]: Normal wall motion.  No ischemia or infarction.  . OPEN REDUCTION INTERNAL FIXATION (ORIF) DISTAL RADIAL FRACTURE Left 01/13/2019   Procedure: OPEN REDUCTION INTERNAL FIXATION (ORIF) DISTAL  RADIAL FRACTURE - LEFT - SLEEP APNEA;  Surgeon: Hessie Knows, MD;  Location: [facility] ORS;  Service: Orthopedics;  Laterality: Left;  . ORIF ELBOW FRACTURE Right 01/01/2017   Procedure: OPEN REDUCTION INTERNAL FIXATION (ORIF) ELBOW/OLECRANON FRACTURE;  Surgeon: Hessie Knows, MD;  Location: [facility] ORS;  Service: Orthopedics;  Laterality: Right;  . ORIF WRIST FRACTURE Right 03/19/2015   Procedure: OPEN REDUCTION INTERNAL FIXATION (ORIF) WRIST FRACTURE;  Surgeon: Hessie Knows, MD;  Location: [facility] ORS;  Service: Orthopedics;  Laterality: Right;  . SHOULDER ARTHROSCOPY Right   . THORACIC AORTA - CAROTID ANGIOGRAM  October 2007   Dr. 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 TRANSTHORACIC ECHOCARDIOGRAM  February 2016   [facility]: Normal LV function. Dilated left atrium.  Domingo Dimes ECHOCARDIOGRAM  04/2019   [facility] April 20, 2019): EF 55-60 %.  Mild LVH.  Relatively normal valves.    There were no vitals filed for this visit.   Subjective Assessment - 03/23/20 0950    Subjective Back pain has been affecting patient lately and  would like to work on his back pain as well as balance. Pain is worse in the morning.    Pertinent History Patient is a pleasant 86 year old who was last seen by this PT on 12/22/19; since then has been admitted to the hospital and had other medical complications including: two surgeries for kidney stones, another one for his broken L foot. Was in a boot for 6 weeks (boot came off a week ago), and procedures for his back. Has been having hallucinations during the day and night time with increased rate frequently  Pain in back is associated with weakness, had facet joint injections. He has a history of kyphoplasty to T12, L3, L4, L5 performed in August 2020 by Dr. Rudene Christians. MRI from [facility] dated 03/28/2019 report was reviewed today. Acute compression fractures are noted at T12, L3, and L4. He has facet arthropathy at the lower lumbar levels. At L3-4 he has moderate central stenosis. PMH includes AAA, arthritis, atherosclerotic heart disease (s/p CABG), benign neoplasm of ascending/descending colon, COPD, CAD, dementia, dysrhythmia, falls, GERD, hemorrhoid, HOH, HTN, PAF, prostate cancer, osteoporosis, kidney stones, macular degeneration.    Limitations Walking;Standing;House hold activities;Other (comment);Lifting;Sitting    How long can you sit comfortably? 5 minutes    How long can you stand comfortably? needs a walker now, unsteady once standing    How long can you walk comfortably? uses a walker now; can walk in house.    Diagnostic tests MRI from [facility] dated 03/28/2019 report was reviewed today. Acute compression fractures are noted at T12, L3, and L4. He has facet arthropathy at the lower lumbar levels. At L3-4 he has moderate central stenosis.    Patient Stated Goals decrease pain and improve balance.    Currently in Pain? Yes    Pain Score 5     Pain Location Back    Pain Orientation Lower    Pain Descriptors / Indicators Aching    Pain Type Chronic pain     Pain Onset More than a month ago    Pain Frequency Constant             Back pain has been affecting patient lately and would like to work on his back pain as well as balance. Pain is worse in the morning.     vitals 127/63    Supine:  hamstring lengthening with leg on PT shoulder 60 seconds each LE Popliteal angle lengthening 2x 60 seconds each LE LE rotation 60 seconds with patient reporting relief  Seated: GTB row 15x GTB single leg adduction against PT resistance 12x each LE GTB alternating LAQ each LE 12 Swiss ball forward rollout 12x 10 second holds    Performed a set of each of the following to demonstrate understanding of new back HEP:   Access Code: [facility] URL: [email or web address] Date: 03/23/2020 Prepared by: Janna Arch  Exercises Supine Pelvic Tilt - 1 x daily - 7 x weekly - 10 reps - 3 sets Supine Single Knee to Chest Stretch - 1 x daily - 7 x weekly - 10 reps - 3 sets Supine Lower Trunk Rotation - 1 x daily - 7 x weekly - 10 reps - 3 sets Supine Hamstring Stretch with Strap - 1 x daily - 7 x weekly - 3 sets - 2 reps - 30 hold Supine March with Posterior Pelvic Tilt - 1 x daily - 7 x weekly - 2 sets - 10 reps - 5 hold Supine Figure 4 Piriformis Stretch - 1 x daily - 7 x weekly - 2 sets - 2 reps - 30 hold Supine Hip Adduction Isometric with Ball - 1 x daily - 7 x weekly - 2 sets - 10 reps - 5 hold        Pt educated throughout session about proper posture and technique with exercises. Improved exercise technique, movement at target joints, use of target muscles after min to mod verbal, visual, tactile cues                 PT Education - 03/23/20 1146    Education provided Yes    Education Details exercise technique, body mechanics, HEP for back    Person(s) Educated Patient    Methods Explanation;Demonstration;Tactile cues;Verbal cues;Handout    Comprehension Verbalized understanding;Returned demonstration;Verbal cues  required;Tactile cues required            PT Short Term Goals - 02/28/20 1721      PT SHORT TERM GOAL #1   Title Patient will be independent in home exercise program to improve strength/mobility for better functional independence with ADLs.    Baseline 6/29: HEP given    Time 4    Period Weeks    Status New    Target Date 03/27/20      PT SHORT TERM GOAL #2   Title Patient will perform 5 sit to stand transfers without UE support to increase functional mobility for carryover to community activities.    Baseline 6/29; only able to perform 2    Time 4    Period Weeks    Status New    Target Date 03/27/20             PT Long Term Goals - 02/28/20 1723      PT LONG TERM GOAL #1   Title Patient will increase FOTO score to equal to or greater than 54% to demonstrate statistically significant improvement in mobility and quality of life.    Baseline 6/29: 54%    Time 8    Period Weeks    Status New    Target Date 04/24/20      PT LONG TERM GOAL #2   Title Patient will demonstrate an improved Berg Balance Score of > 36 as to demonstrate improved balance with ADLs such as sitting/standing and transfer balance and reduced fall risk.  Baseline 6/29: 30/56    Time 8    Period Weeks    Status New    Target Date 04/24/20      PT LONG TERM GOAL #3   Title Patient will increase 10 meter walk test to >1.24ms as to improve gait speed for better community ambulation and to reduce fall risk.    Baseline 6/29: 0.554m with RW    Time 8    Period Weeks    Status New    Target Date 04/24/20      PT LONG TERM GOAL #4   Title Patient will increase BLE gross strength to 4+/5 as to improve functional strength for independent gait, increased standing tolerance and increased ADL ability.    Baseline 6/29: see note    Time 8    Period Weeks    Status New    Target Date 04/24/20      PT LONG TERM GOAL #5   Title Patient will be able to perform household work/ chores without increase  in symptoms.    Baseline 6/29: unable to perform    Time 8    Period Weeks    Status New    Target Date 04/24/20                 Plan - 03/23/20 1150    Clinical Impression Statement Patient presents to physical therapy with continued low back pain. Session focused on low back pain reduction with new HEP for back created and patient educated on and performing to demonstrate carryover for home. Patient reports reduction of pain from 5/10 to 1/10 by end of session and additionally reports feeling more stable and easier to ambulate. Patient would benefit from further skilled PT intervention to decrease pain and increase strength and stability for safer ADL performance and increased functional ability.    Personal Factors and Comorbidities Age;Comorbidity 3+;Fitness;Past/Current Experience;Sex;Social Background;Time since onset of injury/illness/exacerbation;Transportation    Comorbidities AAA, arthritis, atherosclerotic heart disease, (s/p CABG), benign neoplasm of ascending/descending colon, COPD, CAD, dementia, dysrhythmia, frequent falls, GERD, hemorrhoid, HOH, HTN, PAF, prostate cancer.    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    Rehab Potential Fair    PT Frequency 2x / week    PT Duration 8 weeks    PT Treatment/Interventions ADLs/Self Care Home Management;Aquatic Therapy;Cryotherapy;Moist Heat;Traction;Ultrasound;Therapeutic activities;Functional mobility training;Stair training;Gait training;DME Instruction;Therapeutic exercise;Balance training;Neuromuscular re-education;Patient/family education;Manual techniques;Passive range of  motion;Energy conservation;Vestibular;Taping;Biofeedback;Electrical Stimulation;Iontophoresis '4mg'$ /ml Dexamethasone;Dry needling;Visual/perceptual remediation/compensation    PT Next Visit Plan core stability, LE strength, balance    PT Home Exercise Plan see above    Consulted and Agree with Plan of Care Patient           Patient will benefit from skilled therapeutic intervention in order to improve the following deficits and impairments:  Abnormal gait, Cardiopulmonary status limiting activity, Decreased activity tolerance, Decreased balance, Decreased knowledge of precautions, Decreased endurance, Decreased coordination, Decreased cognition, Decreased knowledge of use of DME, Decreased mobility, Difficulty walking, Decreased safety awareness, Decreased strength, Impaired flexibility, Impaired perceived functional ability, Impaired sensation, Postural dysfunction, Improper body mechanics, Pain, Impaired vision/preception, Decreased range of motion  Visit Diagnosis: Unsteadiness on feet  Muscle weakness (generalized)  Other abnormalities of gait and mobility  Chronic low back pain, unspecified back pain laterality, unspecified whether sciatica present     Problem List Patient Active Problem List   Diagnosis Date  Noted  . Hematuria   . Acute kidney injury superimposed on CKD ([place])   . Sepsis due to gram-negative UTI ([place]) 12/26/2019  . Left nephrolithiasis 12/26/2019  . Hydronephrosis, left 12/26/2019  . Status post abdominal aortic aneurysm (AAA) repair 04/15/2019  . Candidiasis 02/22/2019  . Malignant neoplasm of descending colon ([place])   . Benign neoplasm of descending colon   . Benign neoplasm of transverse colon   . Do not intubate, cardiopulmonary resuscitation (CPR)-only code status 10/08/2018  . Prostate cancer ([place]) 02/25/2018  . Prostatic cyst 01/15/2018  . 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 ([place]) 09/15/2017  . Blood in stool   . Abnormal feces   . Stricture and  stenosis of esophagus   . Mild cognitive impairment 08/18/2017  . Senile purpura ([place]) 04/29/2017  . Anemia 04/29/2017  . Frequent falls 04/29/2017  . Simple chronic bronchitis ([place]) 12/17/2016  . Benign localized hyperplasia of prostate with urinary obstruction 07/15/2016  . Elevated PSA 07/15/2016  . Incomplete emptying of bladder 07/15/2016  . Urge incontinence 07/15/2016  . Hypothyroidism 04/25/2016  . Macular degeneration 04/25/2016  . Erectile dysfunction due to arterial insufficiency   . Ischemic heart disease with chronotropic incompetence   . CN (constipation) 02/10/2015  . DD (diverticular disease) 02/10/2015  . Weakness 02/10/2015  . Lichen planus 78/46/9629  . Restless leg 02/10/2015  . 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 B12 deficiency 02/10/2015  . COPD (chronic obstructive pulmonary disease) ([place]) 11/14/2014  . Post Inflammatory Lung Changes 11/14/2014  . Acute delirium 11/08/2014  . PAF (paroxysmal atrial fibrillation) (HCC) CHA2DS2-VASc = 4. AC = Eliquis   . Insomnia 12/09/2013  . OSA on CPAP   . Dyspnea on exertion - -essentially resolved with BB dose reduction & wgt loss 03/29/2013  . Overweight (BMI 25.0-29.9) -- Wgt back up 01/17/2013  . Atherosclerotic heart disease of native coronary artery without angina pectoris - s/p CABG, occluded SVG-D1; patent LIMA-LAD, SVG-OM, SVG- RCA   . Essential hypertension   . Hyperlipidemia with target LDL less than 70   . Aorto-iliac disease ([place])   . BCC (basal cell carcinoma), eyelid 01/03/2013  . Allergic rhinitis 08/17/2009  . Adaptation reaction 05/28/2009  . Acid reflux 05/28/2009  . Arthritis, degenerative 05/28/2009  . 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 Carotid artery occlusion without infarction 06/01/2006  . PAD (peripheral artery disease) - bilateral common iliac stents 12/15/2005  . Atherosclerotic heart disease of artery bypass graft 09/01/1997   Janna Arch, PT, DPT   03/23/2020, 11:54  AM  [place] MAIN [facility] SERVICES [address] [place], [place], [address] Phone: [phone]   Fax:  [phone]  Name: [name] MRN: [id number] Date of Birth: 11/21/1933

## 2020-03-26 ENCOUNTER — Ambulatory Visit: Payer: Medicare Other

## 2020-03-26 ENCOUNTER — Other Ambulatory Visit: Payer: Self-pay

## 2020-03-26 DIAGNOSIS — M545 Low back pain, unspecified: Secondary | ICD-10-CM

## 2020-03-26 DIAGNOSIS — G8929 Other chronic pain: Secondary | ICD-10-CM

## 2020-03-26 DIAGNOSIS — R2681 Unsteadiness on feet: Secondary | ICD-10-CM

## 2020-03-26 DIAGNOSIS — M6281 Muscle weakness (generalized): Secondary | ICD-10-CM

## 2020-03-26 DIAGNOSIS — R2689 Other abnormalities of gait and mobility: Secondary | ICD-10-CM

## 2020-03-26 NOTE — Therapy (Signed)
[place] MAIN [facility] SERVICES [address] [place], [place], [address] Phone: [phone]   Fax:  [phone]  Physical Therapy Treatment  Patient Details  Name: NOELL SHULAR MRN: [id number] Date of Birth: [birth year] Referring Provider (PT): Meeler, Sherren Kerns FNP   Encounter Date: 03/26/2020   PT End of Session - 03/26/20 1357    Visit Number 5    Number of Visits 16    Date for PT Re-Evaluation 04/24/20    Authorization Type 5/10 eval 6/29    PT Start Time 0930    PT Stop Time 1014    PT Time Calculation (min) 44 min    Equipment Utilized During Treatment Gait belt    Activity Tolerance Patient tolerated treatment well;Patient limited by fatigue    Behavior During Therapy WFL for tasks assessed/performed           Past Medical History:  Diagnosis Date   AAA (abdominal aortic aneurysm) ([place]) 05/2019   Relativley Rapid progression from June-Aug 2020 (4.6 - 5.4 cm): s/p EVAR (Dr. Lucky Cowboy [facility]): EVAR - 23 mm prox, 12 cm distal & x 18 cm length Gore Excluder Endoprosthesis Main body (via RFA distal to lowest Renal A); 73m x 14 cm L Contralateral Limb for L Iliac - extended with 8 mm x 6cm LifeStar stent (post-dilated with 7 mm DEB).  Additional R Iliac PTA not required.    Arthritis    Basal cell carcinoma of eyelid 01/03/2013   2019 - R side of Nose   Benign neoplasm of ascending colon    Benign neoplasm of descending colon    [place]   Bilateral Iliac A stenting; extension of EVAR limbs into both Iliacs -- additional stent placed in L Iliac.   Cancer ([facility]    Skin cancer   Colon polyps    Complication of anesthesia    severe confusion agitation requiring hospital admission x 4 days   Compression fracture of fourth lumbar vertebra ([place] 04/2019   - s/p Kyphoplasty; T12, L3-5 (*[facility])   COPD (chronic obstructive pulmonary disease) (HCC)    Centrilobular Emphysema   Coronary artery  disease involving native coronary artery without angina pectoris 1998   - s/p CABG, occluded SVG-D1; patent LIMA-LAD, SVG-OM, SVG- RCA   Dementia ([place]    memory loss - started noting late 2019   Falls frequently    broke foot   Fracture 2021   left foot   GERD (gastroesophageal reflux disease)    Hemorrhoid 02/10/2015   History of hiatal hernia    History of kidney stones    HOH (hard of hearing)    Hypertension    Macular degeneration disease    Osteoporosis    PAF (paroxysmal atrial fibrillation) (HCC)    on Eliquis for OAC - Rate Control    Prostate cancer (HCC)    Prostate disorder    Sciatic pain    Chronic   Sleep apnea    cpap   Temporary cerebral vascular dysfunction 02/10/2015   Had negative work-up.  July, 2012.  No medication changes, had forgot them that day, got dehydrated.    Urinary incontinence     Past Surgical History:  Procedure Laterality Date   ABDOMINAL AORTIC ENDOVASCULAR STENT GRAFT Bilateral 05/18/2019   Procedure: ABDOMINAL AORTIC ENDOVASCULAR STENT GRAFT;  Surgeon: DAlgernon Huxley MD;  Location: [facility] ORS;  Service: Vascular;  Laterality: Bilateral;   ANKLE SURGERY Left  ORIF    Arch aortogram and carotid aortogram  06/02/2006   Dr Oneida Alar did surgery   BASAL CELL CARCINOMA EXCISION  04/2016   Dermatology   CARDIAC CATHETERIZATION  01/30/1998    (Dr. Linard Millers): Native LAD & mRCA CTO. 90% D1. Mod Cx. SVG-D1 CTO. SVG-RCA & SVG-OM along with LIMA-LAD patent;  LV NORMAL Fxn   CAROTID ENDARTERECTOMY Left Oct. 29, 2007   Dr. Oneida Alar:   CATARACT EXTRACTION W/PHACO Right 03/05/2017   Procedure: CATARACT EXTRACTION PHACO AND INTRAOCULAR LENS PLACEMENT ([place]);  Surgeon: Leandrew Koyanagi, MD;  Location: [facility] ORS;  Service: Ophthalmology;  Laterality: Right;  [place] 00:58.5 AP% 10.6 CDE 6.20 Fluid Pack lot # [id number]   CATARACT EXTRACTION W/PHACO Left 04/15/2017   Procedure: CATARACT EXTRACTION PHACO AND INTRAOCULAR LENS PLACEMENT ([place])   Left;  Surgeon: Leandrew Koyanagi, MD;  Location: [place];  Service: Ophthalmology;  Laterality: Left;   COLONOSCOPY WITH PROPOFOL N/A 09/15/2017   Procedure: COLONOSCOPY WITH PROPOFOL;  Surgeon: Lucilla Lame, MD;  Location: [facility] ENDOSCOPY;  Service: Endoscopy;  Laterality: N/A;   COLONOSCOPY WITH PROPOFOL N/A 10/26/2018   Procedure: COLONOSCOPY WITH PROPOFOL;  Surgeon: Lucilla Lame, MD;  Location: [facility] ENDOSCOPY;  Service: Endoscopy;  Laterality: N/A;   CORONARY ARTERY BYPASS GRAFT  1998   LIMA-LAD, SVG-OM, SVG-RPDA, SVG-DIAG   CPET / MET  12/2012   Mild chronotropic incompetence - read 82% of predicted; also reduced effort; peak VO2 15.7 / 75% (did not reach Max effort) -- suggested ischemic response in last 1.5 minutes of exercise. Normal pulmonary function on PFTs but poor response to   CYSTOSCOPY W/ RETROGRADES Left 01/13/2020   Procedure: CYSTOSCOPY WITH RETROGRADE PYELOGRAM;  Surgeon: Billey Co, MD;  Location: [facility] ORS;  Service: Urology;  Laterality: Left;   CYSTOSCOPY WITH STENT PLACEMENT Left 12/26/2019   Procedure: CYSTOSCOPY WITH STENT PLACEMENT;  Surgeon: Billey Co, MD;  Location: [facility] ORS;  Service: Urology;  Laterality: Left;   CYSTOSCOPY/URETEROSCOPY/HOLMIUM LASER/STENT PLACEMENT Left 01/13/2020   Procedure: CYSTOSCOPY/URETEROSCOPY/HOLMIUM LASER/STENT EXCHANGE;  Surgeon: Billey Co, MD;  Location: [facility] ORS;  Service: Urology;  Laterality: Left;   ENDOVASCULAR REPAIR/STENT GRAFT N/A 05/18/2019   Procedure: ENDOVASCULAR REPAIR/STENT GRAFT;  Surgeon: Algernon Huxley, MD;  Location: [facility] INVASIVE CV LAB;; EVAR - 23 mm prox, 12 cm distal & x 18 cm length Gore Excluder Endoprosthesis Main body (via RFA distal to lowest Renal A); 67m x 14 cm L Contralateral Limb for L Iliac - extended with 8 mm x 6cm LifeStar stent; no additional R Iliac PTA needed.    ESOPHAGOGASTRODUODENOSCOPY (EGD) WITH PROPOFOL N/A 09/15/2017   Procedure: ESOPHAGOGASTRODUODENOSCOPY  (EGD) WITH PROPOFOL;  Surgeon: WLucilla Lame MD;  [facility];  Service: Endoscopy;  Laterality: N/A;   FRACTURE SURGERY Right 03/19/2015   wrist   FRACTURE SURGERY Left    HIATAL HERNIA REPAIR     ILIAC ARTERY STENT  12/15/2005   PTA and direct stenting rgt and lft common iliac arteries   KYPHOPLASTY N/A 04/05/2019   Procedure: KYPHOPLASTY T12, L3, L4 L5;  Surgeon: MHessie Knows MD;  Location: [facility] ORS;  Service: Orthopedics;  Laterality: N/A;   NM GATED [place]OVIEW [place]  05/'19; 8/'20   a) [facility]: Low Risk - no ischemia or infarct.  EF > 65%. no RWMA;; b) ([facility]: Normal wall motion.  No ischemia or infarction.   OPEN REDUCTION INTERNAL FIXATION (ORIF) DISTAL RADIAL FRACTURE Left 01/13/2019   Procedure: OPEN REDUCTION INTERNAL FIXATION (ORIF) DISTAL  RADIAL FRACTURE - LEFT - SLEEP APNEA;  Surgeon: Hessie Knows, MD;  Location: [facility] ORS;  Service: Orthopedics;  Laterality: Left;   ORIF ELBOW FRACTURE Right 01/01/2017   Procedure: OPEN REDUCTION INTERNAL FIXATION (ORIF) ELBOW/OLECRANON FRACTURE;  Surgeon: Hessie Knows, MD;  Location: [facility] ORS;  Service: Orthopedics;  Laterality: Right;   ORIF WRIST FRACTURE Right 03/19/2015   Procedure: OPEN REDUCTION INTERNAL FIXATION (ORIF) WRIST FRACTURE;  Surgeon: Hessie Knows, MD;  Location: [facility] ORS;  Service: Orthopedics;  Laterality: Right;   SHOULDER ARTHROSCOPY Right    THORACIC AORTA - CAROTID ANGIOGRAM  October 2007   Dr. 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 Dilated left atrium.   TRANSTHORACIC ECHOCARDIOGRAM  04/2019   [facility] April 20, 2019): EF 55-60 %.  Mild LVH.  Relatively normal valves.    There were no vitals filed for this visit.   Subjective Assessment - 03/26/20 1342    Subjective Patient reports his back pain is getting better but  still affects him, would like to work on both balance and back. No falls or LOB since last session.Patient presents with shoes on wrong feet this session.    Pertinent History Patient is a pleasant 86 year old who was last seen by this PT on 12/22/19; since then has been admitted to the hospital and had other medical complications including: two surgeries for kidney stones, another one for his broken L foot. Was in a boot for 6 weeks (boot came off a week ago), and procedures for his back. Has been having hallucinations during the day and night time with increased rate frequently  Pain in back is associated with weakness, had facet joint injections. He has a history of kyphoplasty to T12, L3, L4, L5 performed in August 2020 by Dr. Rudene Christians. MRI from [facility] dated 03/28/2019 report was reviewed today. Acute compression fractures are noted at T12, L3, and L4. He has facet arthropathy at the lower lumbar levels. At L3-4 he has moderate central stenosis. PMH includes AAA, arthritis, atherosclerotic heart disease (s/p CABG), benign neoplasm of ascending/descending colon, COPD, CAD, dementia, dysrhythmia, falls, GERD, hemorrhoid, HOH, HTN, PAF, prostate cancer, osteoporosis, kidney stones, macular degeneration.    Limitations Walking;Standing;House hold activities;Other (comment);Lifting;Sitting    How long can you sit comfortably? 5 minutes    How long can you stand comfortably? needs a walker now, unsteady once standing    How long can you walk comfortably? uses a walker now; can walk in house.    Diagnostic tests MRI from [facility] dated 03/28/2019 report was reviewed today. Acute compression fractures are noted at T12, L3, and L4. He has facet arthropathy at the lower lumbar levels. At L3-4 he has moderate central stenosis.    Patient Stated Goals decrease pain and improve balance.    Currently in Pain? Yes    Pain Score 4     Pain Location Back    Pain Orientation Lower     Pain Descriptors / Indicators Aching    Pain Type Chronic pain    Pain Onset More than a month ago    Pain Frequency Constant              Vitals at start of session:  124/57 pulse 66     Supine:  hamstring lengthening with leg on PT shoulder 60 seconds each  LE Popliteal angle lengthening 2x 60 seconds each LE LE rotation 60 seconds with patient reporting relief sciatic nerve glide with leg on PT shoulder 20x  Swiss ball TrA contraction 15x 3 second holds   Standing: airex pad: -one foot on airex pad one foot on 6' step static hold 45 seconds each position x 2 trials each LE -airex pad horizontal head turns 10x each direction.   6" step up/down 10x LLE, 10x RLE, increased use of UE support with LLE  Seated: GTB row 15x GTB single leg adduction against PT resistance 12x each LE GTB alternating LAQ each LE 12 STS grab ball and toss into hoop for pertubations x 6 minutes Seated swiss ball TrA pressing into hands and knees 12x 3 second holds         Pt educated throughout session about proper posture and technique with exercises. Improved exercise technique, movement at target joints, use of target muscles after min to mod verbal, visual, tactile cues                      PT Education - 03/26/20 1357    Education provided Yes    Education Details exercise technique, body mechanics    Person(s) Educated Patient    Methods Explanation;Demonstration;Tactile cues;Verbal cues    Comprehension Verbalized understanding;Returned demonstration;Verbal cues required;Tactile cues required            PT Short Term Goals - 02/28/20 1721      PT SHORT TERM GOAL #1   Title Patient will be independent in home exercise program to improve strength/mobility for better functional independence with ADLs.    Baseline 6/29: HEP given    Time 4    Period Weeks    Status New    Target Date 03/27/20      PT SHORT TERM GOAL #2   Title Patient will perform 5 sit to  stand transfers without UE support to increase functional mobility for carryover to community activities.    Baseline 6/29; only able to perform 2    Time 4    Period Weeks    Status New    Target Date 03/27/20             PT Long Term Goals - 02/28/20 1723      PT LONG TERM GOAL #1   Title Patient will increase FOTO score to equal to or greater than 54% to demonstrate statistically significant improvement in mobility and quality of life.    Baseline 6/29: 54%    Time 8    Period Weeks    Status New    Target Date 04/24/20      PT LONG TERM GOAL #2   Title Patient will demonstrate an improved Berg Balance Score of > 36 as to demonstrate improved balance with ADLs such as sitting/standing and transfer balance and reduced fall risk.    Baseline 6/29: 30/56    Time 8    Period Weeks    Status New    Target Date 04/24/20      PT LONG TERM GOAL #3   Title Patient will increase 10 meter walk test to >1.56ms as to improve gait speed for better community ambulation and to reduce fall risk.    Baseline 6/29: 0.569m with RW    Time 8    Period Weeks    Status New    Target Date 04/24/20      PT LONG TERM GOAL #4  Title Patient will increase BLE gross strength to 4+/5 as to improve functional strength for independent gait, increased standing tolerance and increased ADL ability.    Baseline 6/29: see note    Time 8    Period Weeks    Status New    Target Date 04/24/20      PT LONG TERM GOAL #5   Title Patient will be able to perform household work/ chores without increase in symptoms.    Baseline 6/29: unable to perform    Time 8    Period Weeks    Status New    Target Date 04/24/20                 Plan - 03/26/20 1405    Clinical Impression Statement Patient is intermittently confused this session requiring more frequent visual and verbal cueing for task performance. Combination of back pain reduction and stability performed with patient verbalizing improved  pain levels as well as increased stability by end of session. Patient would benefit from further skilled PT intervention to decrease pain and increase strength and stability for safer ADL performance and increased functional ability    Personal Factors and Comorbidities Age;Comorbidity 3+;Fitness;Past/Current Experience;Sex;Social Background;Time since onset of injury/illness/exacerbation;Transportation    Comorbidities AAA, arthritis, atherosclerotic heart disease, (s/p CABG), benign neoplasm of ascending/descending colon, COPD, CAD, dementia, dysrhythmia, frequent falls, GERD, hemorrhoid, HOH, HTN, PAF, prostate cancer.    Examination-Activity Limitations Bathing;Bend;Carry;Dressing;Continence;Stairs;Squat;Reach Overhead;Locomotion Level;Lift;Stand;Transfers;Toileting;Bed Mobility    Examination-Participation Restrictions Church;Cleaning;Community Activity;Driving;Interpersonal Relationship;Laundry;Volunteer;Shop;Personal Finances;Meal Prep;Yard Work    [employer] Evolving/Moderate complexity    Rehab Potential Fair    PT Frequency 2x / week    PT Duration 8 weeks    PT Treatment/Interventions ADLs/Self Care Home Management;Aquatic Therapy;Cryotherapy;Moist Heat;Traction;Ultrasound;Therapeutic activities;Functional mobility training;Stair training;Gait training;DME Instruction;Therapeutic exercise;Balance training;Neuromuscular re-education;Patient/family education;Manual techniques;Passive range of motion;Energy conservation;Vestibular;Taping;Biofeedback;Electrical Stimulation;Iontophoresis 43m/ml Dexamethasone;Dry needling;Visual/perceptual remediation/compensation    PT Next Visit Plan core stability, LE strength, balance    PT Home Exercise Plan see above    Consulted and Agree with Plan of Care Patient           Patient will benefit from skilled therapeutic intervention in order to improve the following deficits and impairments:  Abnormal gait, Cardiopulmonary status  limiting activity, Decreased activity tolerance, Decreased balance, Decreased knowledge of precautions, Decreased endurance, Decreased coordination, Decreased cognition, Decreased knowledge of use of DME, Decreased mobility, Difficulty walking, Decreased safety awareness, Decreased strength, Impaired flexibility, Impaired perceived functional ability, Impaired sensation, Postural dysfunction, Improper body mechanics, Pain, Impaired vision/preception, Decreased range of motion  Visit Diagnosis: Unsteadiness on feet  Muscle weakness (generalized)  Other abnormalities of gait and mobility  Chronic low back pain, unspecified back pain laterality, unspecified whether sciatica present     Problem List Patient Active Problem List   Diagnosis Date Noted   Hematuria    Acute kidney injury superimposed on CKD ([place]    Sepsis due to gram-negative UTI ([place] 12/26/2019   Left nephrolithiasis 12/26/2019   Hydronephrosis, left 12/26/2019   Status post abdominal aortic aneurysm (AAA) repair 04/15/2019   Candidiasis 02/22/2019   Malignant neoplasm of descending colon (HCC)    Benign neoplasm of descending colon    Benign neoplasm of transverse colon    Do not intubate, cardiopulmonary resuscitation (CPR)-only code status 10/08/2018   Prostate cancer ([place] 02/25/2018   Prostatic cyst 01/15/2018   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 ([place] 09/15/2017   Blood in stool    Abnormal feces    Stricture and stenosis  of esophagus    Mild cognitive impairment 08/18/2017   Senile purpura ([place]) 04/29/2017   Anemia 04/29/2017   Frequent falls 04/29/2017   Simple chronic bronchitis ([place]) 12/17/2016   Benign localized hyperplasia of prostate with urinary obstruction 07/15/2016   Elevated PSA 07/15/2016   Incomplete emptying of bladder 07/15/2016   Urge incontinence 07/15/2016   Hypothyroidism 04/25/2016   Macular degeneration 04/25/2016   Erectile dysfunction due to arterial  insufficiency    Ischemic heart disease with chronotropic incompetence    CN (constipation) 02/10/2015   DD (diverticular disease) 02/10/2015   Weakness 84/53/6468   Lichen planus 11/20/2246   Restless leg 02/10/2015   Circadian rhythm disorder 02/10/2015   B12 deficiency 02/10/2015   COPD (chronic obstructive pulmonary disease) ([place]) 11/14/2014   Post Inflammatory Lung Changes 11/14/2014   Acute delirium 11/08/2014   PAF (paroxysmal atrial fibrillation) (HCC) CHA2DS2-VASc = 4. AC = Eliquis    Insomnia 12/09/2013   OSA on CPAP    Dyspnea on exertion - -essentially resolved with BB dose reduction & wgt loss 03/29/2013   Overweight (BMI 25.0-29.9) -- Wgt back up 01/17/2013   Atherosclerotic heart disease of native coronary artery without angina pectoris - s/p CABG, occluded SVG-D1; patent LIMA-LAD, SVG-OM, SVG- RCA    Essential hypertension    Hyperlipidemia with target LDL less than 70    Aorto-iliac disease ([place])    BCC (basal cell carcinoma), eyelid 01/03/2013   Allergic rhinitis 08/17/2009   Adaptation reaction 05/28/2009   Acid reflux 05/28/2009   Arthritis, degenerative 05/28/2009   Abnormality of aortic arch branch 06/01/2006   Carotid artery occlusion without infarction 06/01/2006   PAD (peripheral artery disease) - bilateral common iliac stents 12/15/2005   Atherosclerotic heart disease of artery bypass graft 09/01/1997   Janna Arch, PT, DPT   03/26/2020, 2:09 PM  [place] MAIN [facility] SERVICES [address] [place], [place], [address] Phone: [phone]   Fax:  [phone]  Name: DURIEL DEERY MRN: [id number] Date of Birth: [birth year]

## 2020-03-28 ENCOUNTER — Ambulatory Visit: Payer: Medicare Other

## 2020-03-28 ENCOUNTER — Other Ambulatory Visit: Payer: Self-pay

## 2020-03-28 DIAGNOSIS — G8929 Other chronic pain: Secondary | ICD-10-CM

## 2020-03-28 DIAGNOSIS — R2681 Unsteadiness on feet: Secondary | ICD-10-CM | POA: Diagnosis not present

## 2020-03-28 DIAGNOSIS — M545 Low back pain, unspecified: Secondary | ICD-10-CM

## 2020-03-28 DIAGNOSIS — R2689 Other abnormalities of gait and mobility: Secondary | ICD-10-CM

## 2020-03-28 DIAGNOSIS — M6281 Muscle weakness (generalized): Secondary | ICD-10-CM

## 2020-03-28 NOTE — Therapy (Signed)
[place] MAIN [facility] SERVICES [address] [place], [place], [address] Phone: [phone]   Fax:  [phone]  Physical Therapy Treatment  Patient Details  Name: [name] MRN: [id number] Date of Birth: 10-14-1933 Referring Provider (PT): Meeler, Sherren Kerns FNP   Encounter Date: 03/28/2020   PT End of Session - 03/28/20 0911    Visit Number 6    Number of Visits 16    Date for PT Re-Evaluation 04/24/20    Authorization Type 6/10 eval 6/29    PT Start Time 0844    PT Stop Time 0926    PT Time Calculation (min) 42 min    Equipment Utilized During Treatment Gait belt    Activity Tolerance Patient tolerated treatment well;Patient limited by fatigue    Behavior During Therapy [facility] for tasks assessed/performed           Past Medical History:  Diagnosis Date  . AAA (abdominal aortic aneurysm) ([place]) 05/2019   [place] Rapid progression from June-Aug 2020 (4.6 - 5.4 cm): s/p EVAR (Dr. [facility]): EVAR - 23 mm prox, 12 cm distal & x 18 cm length Gore Excluder Endoprosthesis Main body (via RFA distal to lowest Renal A); 66m x 14 cm L Contralateral Limb for L Iliac - extended with 8 mm x 6cm LifeStar stent (post-dilated with 7 mm DEB).  Additional R Iliac PTA not required.   . Arthritis   . Basal cell carcinoma of eyelid 01/03/2013   2019 - R side of Nose  . Benign neoplasm of ascending colon   . Benign neoplasm of descending colon   . Bilateral iliac artery stenosis ([place] 2007   Bilateral Iliac A stenting; extension of EVAR limbs into both Iliacs -- additional stent placed in L Iliac.  . Cancer ([place]    Skin cancer  . Colon polyps   . Complication of anesthesia    severe confusion agitation requiring hospital admission x 4 days  . Compression fracture of fourth lumbar vertebra ([place] 04/2019   - s/p Kyphoplasty; T12, L3-5 ([facility]  . COPD (chronic obstructive pulmonary disease) (HCC)    Centrilobular Emphysema  . Coronary artery  disease involving native coronary artery without angina pectoris 1998   - s/p CABG, occluded SVG-D1; patent LIMA-LAD, SVG-OM, SVG- RCA  . Dementia ([place]    memory loss - started noting late 2019  . Falls frequently    broke foot  . Fracture 2021   left foot  . GERD (gastroesophageal reflux disease)   . Hemorrhoid 02/10/2015  . History of hiatal hernia   . History of kidney stones   . HOH (hard of hearing)   . Hypertension   . Macular degeneration disease   . Osteoporosis   . PAF (paroxysmal atrial fibrillation) ([place]    on Eliquis for OAC - Rate Control   . Prostate cancer ([place]   . Prostate disorder   . Sciatic pain    Chronic  . Sleep apnea    cpap  . Temporary cerebral vascular dysfunction 02/10/2015   Had negative work-up.  July, 2012.  No medication changes, had forgot them that day, got dehydrated.   . Urinary incontinence     Past Surgical History:  Procedure Laterality Date  . ABDOMINAL AORTIC ENDOVASCULAR STENT GRAFT Bilateral 05/18/2019   Procedure: ABDOMINAL AORTIC ENDOVASCULAR STENT GRAFT;  Surgeon: DAlgernon Huxley MD;  Location: [facility] ORS;  Service: Vascular;  Laterality: Bilateral;  . ANKLE SURGERY Left  ORIF   . Arch aortogram and carotid aortogram  06/02/2006   Dr Oneida Alar did surgery  . BASAL CELL CARCINOMA EXCISION  04/2016   Dermatology  . CARDIAC CATHETERIZATION  01/30/1998    (Dr. Linard Millers): Native LAD & mRCA CTO. 90% D1. Mod Cx. SVG-D1 CTO. SVG-RCA & SVG-OM along with LIMA-LAD patent;  LV NORMAL Fxn  . CAROTID ENDARTERECTOMY Left Oct. 29, 2007   Dr. Oneida Alar:  . CATARACT EXTRACTION W/PHACO Right 03/05/2017   Procedure: CATARACT EXTRACTION PHACO AND INTRAOCULAR LENS PLACEMENT (IOC);  Surgeon: Leandrew Koyanagi, MD;  Location: [facility] ORS;  Service: Ophthalmology;  Laterality: Right;  [place] 00:58.5 AP% 10.6 CDE 6.20 Fluid Pack lot # [id number]  . CATARACT EXTRACTION W/PHACO Left 04/15/2017   Procedure: CATARACT EXTRACTION PHACO AND INTRAOCULAR LENS PLACEMENT ([place])   Left;  Surgeon: Leandrew Koyanagi, MD;  Location: [place];  Service: Ophthalmology;  Laterality: Left;  . COLONOSCOPY WITH PROPOFOL N/A 09/15/2017   Procedure: COLONOSCOPY WITH PROPOFOL;  Surgeon: Lucilla Lame, MD;  Location: [facility] ENDOSCOPY;  Service: Endoscopy;  Laterality: N/A;  . COLONOSCOPY WITH PROPOFOL N/A 10/26/2018   Procedure: COLONOSCOPY WITH PROPOFOL;  Surgeon: Lucilla Lame, MD;  Location: [facility] ENDOSCOPY;  Service: Endoscopy;  Laterality: N/A;  . CORONARY ARTERY BYPASS GRAFT  1998   LIMA-LAD, SVG-OM, SVG-RPDA, SVG-DIAG  . CPET / MET  12/2012   Mild chronotropic incompetence - read 82% of predicted; also reduced effort; peak VO2 15.7 / 75% (did not reach Max effort) -- suggested ischemic response in last 1.5 minutes of exercise. Normal pulmonary function on PFTs but poor response to  . CYSTOSCOPY W/ RETROGRADES Left 01/13/2020   Procedure: CYSTOSCOPY WITH RETROGRADE PYELOGRAM;  Surgeon: Billey Co, MD;  Location: [facility] ORS;  Service: Urology;  Laterality: Left;  . CYSTOSCOPY WITH STENT PLACEMENT Left 12/26/2019   Procedure: CYSTOSCOPY WITH STENT PLACEMENT;  Surgeon: Billey Co, MD;  Location: [facility] ORS;  Service: Urology;  Laterality: Left;  . CYSTOSCOPY/URETEROSCOPY/HOLMIUM LASER/STENT PLACEMENT Left 01/13/2020   Procedure: CYSTOSCOPY/URETEROSCOPY/HOLMIUM LASER/STENT EXCHANGE;  Surgeon: Billey Co, MD;  Location: [facility] ORS;  Service: Urology;  Laterality: Left;  . ENDOVASCULAR REPAIR/STENT GRAFT N/A 05/18/2019   Procedure: ENDOVASCULAR REPAIR/STENT GRAFT;  Surgeon: Algernon Huxley, MD;  Location: [facility] INVASIVE CV LAB;; EVAR - 23 mm prox, 12 cm distal & x 18 cm length Gore Excluder Endoprosthesis Main body (via RFA distal to lowest Renal A); 35m x 14 cm L Contralateral Limb for L Iliac - extended with 8 mm x 6cm LifeStar stent; no additional R Iliac PTA needed.   . ESOPHAGOGASTRODUODENOSCOPY (EGD) WITH PROPOFOL N/A 09/15/2017   Procedure: ESOPHAGOGASTRODUODENOSCOPY  (EGD) WITH PROPOFOL;  Surgeon: WLucilla Lame MD;  [facility];  Service: Endoscopy;  Laterality: N/A;  . FRACTURE SURGERY Right 03/19/2015   wrist  . FRACTURE SURGERY Left   . HIATAL HERNIA REPAIR    . ILIAC ARTERY STENT  12/15/2005   PTA and direct stenting rgt and lft common iliac arteries  . KYPHOPLASTY N/A 04/05/2019   Procedure: KYPHOPLASTY T12, L3, L4 L5;  Surgeon: MHessie Knows MD;  Location: [facility] ORS;  Service: Orthopedics;  Laterality: N/A;  . NM GATED MYOVIEW ([place])  05/'19; 8/'20   a) [facility]: Low Risk - no ischemia or infarct.  EF > 65%. no RWMA;; b) ([facility]: Normal wall motion.  No ischemia or infarction.  . OPEN REDUCTION INTERNAL FIXATION (ORIF) DISTAL RADIAL FRACTURE Left 01/13/2019   Procedure: OPEN REDUCTION INTERNAL FIXATION (ORIF) DISTAL  RADIAL FRACTURE - LEFT - SLEEP APNEA;  Surgeon: Hessie Knows, MD;  Location: [facility] ORS;  Service: Orthopedics;  Laterality: Left;  . ORIF ELBOW FRACTURE Right 01/01/2017   Procedure: OPEN REDUCTION INTERNAL FIXATION (ORIF) ELBOW/OLECRANON FRACTURE;  Surgeon: Hessie Knows, MD;  Location: [facility] ORS;  Service: Orthopedics;  Laterality: Right;  . ORIF WRIST FRACTURE Right 03/19/2015   Procedure: OPEN REDUCTION INTERNAL FIXATION (ORIF) WRIST FRACTURE;  Surgeon: Hessie Knows, MD;  Location: [facility] ORS;  Service: Orthopedics;  Laterality: Right;  . SHOULDER ARTHROSCOPY Right   . THORACIC AORTA - CAROTID ANGIOGRAM  October 2007   Dr. 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 TRANSTHORACIC ECHOCARDIOGRAM  February 2016   [facility]: Normal LV function. Dilated left atrium.  Domingo Dimes ECHOCARDIOGRAM  04/2019   [facility] April 20, 2019): EF 55-60 %.  Mild LVH.  Relatively normal valves.    There were no vitals filed for this visit.   Subjective Assessment - 03/28/20 0848    Subjective Patient reports his back pain is improving, no falls  or stumbles since last session.    Pertinent History Patient is a pleasant 86 year old who was last seen by this PT on 12/22/19; since then has been admitted to the hospital and had other medical complications including: two surgeries for kidney stones, another one for his broken L foot. Was in a boot for 6 weeks (boot came off a week ago), and procedures for his back. Has been having hallucinations during the day and night time with increased rate frequently  Pain in back is associated with weakness, had facet joint injections. He has a history of kyphoplasty to T12, L3, L4, L5 performed in August 2020 by Dr. Rudene Christians. MRI from [facility] dated 03/28/2019 report was reviewed today. Acute compression fractures are noted at T12, L3, and L4. He has facet arthropathy at the lower lumbar levels. At L3-4 he has moderate central stenosis. PMH includes AAA, arthritis, atherosclerotic heart disease (s/p CABG), benign neoplasm of ascending/descending colon, COPD, CAD, dementia, dysrhythmia, falls, GERD, hemorrhoid, HOH, HTN, PAF, prostate cancer, osteoporosis, kidney stones, macular degeneration.    Limitations Walking;Standing;House hold activities;Other (comment);Lifting;Sitting    How long can you sit comfortably? 5 minutes    How long can you stand comfortably? needs a walker now, unsteady once standing    How long can you walk comfortably? uses a walker now; can walk in house.    Diagnostic tests MRI from [facility] dated 03/28/2019 report was reviewed today. Acute compression fractures are noted at T12, L3, and L4. He has facet arthropathy at the lower lumbar levels. At L3-4 he has moderate central stenosis.    Patient Stated Goals decrease pain and improve balance.    Currently in Pain? Yes    Pain Score 5     Pain Location Back    Pain Orientation Lower    Pain Descriptors / Indicators Aching    Pain Type Chronic pain    Pain Onset More than a month ago    Pain Frequency  Constant            vitals at start of session 115/63 pulse 60       Supine:  hamstring lengthening with leg on PT shoulder 60 seconds each LE Popliteal angle lengthening 2x 60 seconds each LE LE rotation 60 seconds with patient reporting relief sciatic nerve glide with leg on  PT shoulder 20x  GTB marching with TrA contraction 10x each LE     Standing: with support surface: CGA for stability and safety.  airex pad: -one foot on airex pad one foot on 6' step static hold 45 seconds each position x 2 trials each LE -airex pad 6" step toe taps SUE support 12x each LE with one episode of LOB  Standing with 3lb ankle weight -marching with BUE support, 12x each LE  -hip abduction 12x each LE, BUE support, cues for tapping foot for sequencing -hip extension/ step back 12x each LE, BUE support, cues for upright posture   Standing reach for ball toss at target 15x, forward reach and return to COM.    Seated:  3lb ankle weights -LAQ 12x each LE cues for upright posture -marches 12x with arms crossed  Adduction green ball squeeze 15x      Pt educated throughout session about proper posture and technique with exercises. Improved exercise technique, movement at target joints, use of target muscles after min to mod verbal, visual, tactile cues                    PT Education - 03/28/20 0911    Education provided Yes    Education Details exercise technique, body mechanics    Person(s) Educated Patient    Methods Explanation;Demonstration;Tactile cues;Verbal cues    Comprehension Verbalized understanding;Returned demonstration;Verbal cues required;Tactile cues required            PT Short Term Goals - 02/28/20 1721      PT SHORT TERM GOAL #1   Title Patient will be independent in home exercise program to improve strength/mobility for better functional independence with ADLs.    Baseline 6/29: HEP given    Time 4    Period Weeks    Status New    Target Date  03/27/20      PT SHORT TERM GOAL #2   Title Patient will perform 5 sit to stand transfers without UE support to increase functional mobility for carryover to community activities.    Baseline 6/29; only able to perform 2    Time 4    Period Weeks    Status New    Target Date 03/27/20             PT Long Term Goals - 02/28/20 1723      PT LONG TERM GOAL #1   Title Patient will increase FOTO score to equal to or greater than 54% to demonstrate statistically significant improvement in mobility and quality of life.    Baseline 6/29: 54%    Time 8    Period Weeks    Status New    Target Date 04/24/20      PT LONG TERM GOAL #2   Title Patient will demonstrate an improved Berg Balance Score of > 36 as to demonstrate improved balance with ADLs such as sitting/standing and transfer balance and reduced fall risk.    Baseline 6/29: 30/56    Time 8    Period Weeks    Status New    Target Date 04/24/20      PT LONG TERM GOAL #3   Title Patient will increase 10 meter walk test to >1.55m/s as to improve gait speed for better community ambulation and to reduce fall risk.    Baseline 6/29: 0.51m/s with RW    Time 8    Period Weeks    Status New    Target  Date 04/24/20      PT LONG TERM GOAL #4   Title Patient will increase BLE gross strength to 4+/5 as to improve functional strength for independent gait, increased standing tolerance and increased ADL ability.    Baseline 6/29: see note    Time 8    Period Weeks    Status New    Target Date 04/24/20      PT LONG TERM GOAL #5   Title Patient will be able to perform household work/ chores without increase in symptoms.    Baseline 6/29: unable to perform    Time 8    Period Weeks    Status New    Target Date 04/24/20                 Plan - 03/28/20 0916    Clinical Impression Statement Patient is challenged with coordination and muscle recruitment of LLE this session requiring increased cueing for weighted interventions  in standing. Back pain is relieved with supine interventions with patient reporting reduction to 1/10. Patient would benefit from further skilled PT intervention to decrease pain and increase strength and stability for safer ADL performance and increased functional ability.    Personal Factors and Comorbidities Age;Comorbidity 3+;Fitness;Past/Current Experience;Sex;Social Background;Time since onset of injury/illness/exacerbation;Transportation    Comorbidities AAA, arthritis, atherosclerotic heart disease, (s/p CABG), benign neoplasm of ascending/descending colon, COPD, CAD, dementia, dysrhythmia, frequent falls, GERD, hemorrhoid, HOH, HTN, PAF, prostate cancer.    Examination-Activity Limitations Bathing;Bend;Carry;Dressing;Continence;Stairs;Squat;Reach Overhead;Locomotion Level;Lift;Stand;Transfers;Toileting;Bed Mobility    Examination-Participation Restrictions Church;Cleaning;Community Activity;Driving;Interpersonal Relationship;Laundry;Volunteer;Shop;Personal Finances;Meal Prep;Yard Work    [employer] Evolving/Moderate complexity    Rehab Potential Fair    PT Frequency 2x / week    PT Duration 8 weeks    PT Treatment/Interventions ADLs/Self Care Home Management;Aquatic Therapy;Cryotherapy;Moist Heat;Traction;Ultrasound;Therapeutic activities;Functional mobility training;Stair training;Gait training;DME Instruction;Therapeutic exercise;Balance training;Neuromuscular re-education;Patient/family education;Manual techniques;Passive range of motion;Energy conservation;Vestibular;Taping;Biofeedback;Electrical Stimulation;Iontophoresis '4mg'$ /ml Dexamethasone;Dry needling;Visual/perceptual remediation/compensation    PT Next Visit Plan core stability, LE strength, balance    PT Home Exercise Plan see above    Consulted and Agree with Plan of Care Patient           Patient will benefit from skilled therapeutic intervention in order to improve the following deficits and  impairments:  Abnormal gait, Cardiopulmonary status limiting activity, Decreased activity tolerance, Decreased balance, Decreased knowledge of precautions, Decreased endurance, Decreased coordination, Decreased cognition, Decreased knowledge of use of DME, Decreased mobility, Difficulty walking, Decreased safety awareness, Decreased strength, Impaired flexibility, Impaired perceived functional ability, Impaired sensation, Postural dysfunction, Improper body mechanics, Pain, Impaired vision/preception, Decreased range of motion  Visit Diagnosis: Unsteadiness on feet  Muscle weakness (generalized)  Other abnormalities of gait and mobility  Chronic low back pain, unspecified back pain laterality, unspecified whether sciatica present     Problem List Patient Active Problem List   Diagnosis Date Noted  . Hematuria   . Acute kidney injury superimposed on CKD ([place])   . Sepsis due to gram-negative UTI ([place]) 12/26/2019  . Left nephrolithiasis 12/26/2019  . Hydronephrosis, left 12/26/2019  . Status post abdominal aortic aneurysm (AAA) repair 04/15/2019  . Candidiasis 02/22/2019  . Malignant neoplasm of descending colon ([place])   . Benign neoplasm of descending colon   . Benign neoplasm of transverse colon   . Do not intubate, cardiopulmonary resuscitation (CPR)-only code status 10/08/2018  . Prostate cancer ([place]) 02/25/2018  . Prostatic cyst 01/15/2018  . 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 ([place]) 09/15/2017  . Blood in stool   .  Abnormal feces   . Stricture and stenosis of esophagus   . Mild cognitive impairment 08/18/2017  . Senile purpura ([place]) 04/29/2017  . Anemia 04/29/2017  . Frequent falls 04/29/2017  . Simple chronic bronchitis ([place]) 12/17/2016  . Benign localized hyperplasia of prostate with urinary obstruction 07/15/2016  . Elevated PSA 07/15/2016  . Incomplete emptying of bladder 07/15/2016  . Urge incontinence 07/15/2016  . Hypothyroidism 04/25/2016  . Macular degeneration  04/25/2016  . Erectile dysfunction due to arterial insufficiency   . Ischemic heart disease with chronotropic incompetence   . CN (constipation) 02/10/2015  . DD (diverticular disease) 02/10/2015  . Weakness 02/10/2015  . Lichen planus 97/67/3419  . Restless leg 02/10/2015  . 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 B12 deficiency 02/10/2015  . COPD (chronic obstructive pulmonary disease) ([place]) 11/14/2014  . Post Inflammatory Lung Changes 11/14/2014  . Acute delirium 11/08/2014  . PAF (paroxysmal atrial fibrillation) (HCC) CHA2DS2-VASc = 4. AC = Eliquis   . Insomnia 12/09/2013  . OSA on CPAP   . Dyspnea on exertion - -essentially resolved with BB dose reduction & wgt loss 03/29/2013  . Overweight (BMI 25.0-29.9) -- Wgt back up 01/17/2013  . Atherosclerotic heart disease of native coronary artery without angina pectoris - s/p CABG, occluded SVG-D1; patent LIMA-LAD, SVG-OM, SVG- RCA   . Essential hypertension   . Hyperlipidemia with target LDL less than 70   . Aorto-iliac disease ([place])   . BCC (basal cell carcinoma), eyelid 01/03/2013  . Allergic rhinitis 08/17/2009  . Adaptation reaction 05/28/2009  . Acid reflux 05/28/2009  . Arthritis, degenerative 05/28/2009  . 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 Carotid artery occlusion without infarction 06/01/2006  . PAD (peripheral artery disease) - bilateral common iliac stents 12/15/2005  . Atherosclerotic heart disease of artery bypass graft 09/01/1997   Janna Arch, PT, DPT   03/28/2020, 9:26 AM  [place] MAIN [facility] SERVICES [address] [place], [place], [address] Phone: [phone]   Fax:  [phone]  Name: RHYDIAN BALDI MRN: [id number] Date of Birth: 05/15/1934

## 2020-03-29 ENCOUNTER — Other Ambulatory Visit: Payer: Self-pay

## 2020-03-29 ENCOUNTER — Ambulatory Visit
Admission: RE | Admit: 2020-03-29 | Discharge: 2020-03-29 | Disposition: A | Discharge status: Home / Self Care | Payer: Medicare Other | Source: Ambulatory Visit | Attending: Urology | Admitting: Urology

## 2020-03-29 DIAGNOSIS — N200 Calculus of kidney: Secondary | ICD-10-CM | POA: Insufficient documentation

## 2020-04-02 ENCOUNTER — Telehealth: Payer: Self-pay

## 2020-04-02 ENCOUNTER — Telehealth: Payer: Self-pay | Admitting: Family Medicine

## 2020-04-02 NOTE — Telephone Encounter (Signed)
-----   Message from Billey Co, MD sent at 04/02/2020 10:39 AM EDT ----- Renal US looks good, no swelling in kidney or residual stones. Keep follow up with Dr Bernardo Heater as scheduled for prostate cancer in March, thanks  Nickolas Madrid, MD 04/02/2020

## 2020-04-02 NOTE — Telephone Encounter (Signed)
Called pt's wife informed her of the information below per DPR. Pt's wife gave verbal understanding.

## 2020-04-02 NOTE — Telephone Encounter (Signed)
Patients significant other left message on triage line about results of RUS. Please advise.

## 2020-04-02 NOTE — Telephone Encounter (Signed)
See Result note 

## 2020-04-03 ENCOUNTER — Ambulatory Visit: Payer: Medicare Other | Attending: Nurse Practitioner

## 2020-04-03 ENCOUNTER — Other Ambulatory Visit: Payer: Self-pay

## 2020-04-03 ENCOUNTER — Ambulatory Visit: Payer: Self-pay | Admitting: *Deleted

## 2020-04-03 DIAGNOSIS — R262 Difficulty in walking, not elsewhere classified: Secondary | ICD-10-CM | POA: Insufficient documentation

## 2020-04-03 DIAGNOSIS — R2689 Other abnormalities of gait and mobility: Secondary | ICD-10-CM | POA: Insufficient documentation

## 2020-04-03 DIAGNOSIS — G8929 Other chronic pain: Secondary | ICD-10-CM | POA: Insufficient documentation

## 2020-04-03 DIAGNOSIS — M545 Low back pain, unspecified: Secondary | ICD-10-CM

## 2020-04-03 DIAGNOSIS — R2681 Unsteadiness on feet: Secondary | ICD-10-CM | POA: Insufficient documentation

## 2020-04-03 DIAGNOSIS — M6281 Muscle weakness (generalized): Secondary | ICD-10-CM | POA: Insufficient documentation

## 2020-04-03 NOTE — Telephone Encounter (Signed)
I returned the call to pt's son [name] who is on the [facility].   He is concerned his dad is getting a UTI.   He is more sluggish, more confused and c/o a little pain in his right lower back.   He gets UTIs and this is how he acts.  He is not able to really tell me much about his symptoms I just know this is how he acts before going into a UTI.  There were not openings today at [facility] so son is taking father to the urgent care center today.  I let him know that's a good idea so if he is getting into a UTI they can begin treatment right away.  [name] was agreeable to this plan.    Reason for Disposition  Side (flank) or lower back pain present    Also sluggish and more confused  Answer Assessment - Initial Assessment Questions 1. SYMPTOM: "What's the main symptom you're concerned about?" (e.g., frequency, incontinence)     Son [name] called.   Father is sleeping a lot and is sluggish.  He is usually like this before a UTI.  I just want to be sure he is not going into UTI.  He is slightly more confused than usual.  He is going to neurologist tomorrow. 2. ONSET: "When did the  sluggishness  start?"     Last 5-7 days more so than normal 3. PAIN: "Is there any pain?" If Yes, ask: "How bad is it?" (Scale: 1-10; mild, moderate, severe)     He c/o pain in lower right back.  He fell 2 days ago but he was having this pain prior to the fall. 4. CAUSE: "What do you think is causing the symptoms?"     UTI 5. OTHER SYMPTOMS: "Do you have any other symptoms?" (e.g., fever, flank pain, blood in urine, pain with urination)     No fever 6. PREGNANCY: "Is there any chance you are pregnant?" "When was your last menstrual period?"     N/A  Protocols used: URINARY [facility]

## 2020-04-03 NOTE — Therapy (Signed)
[place] MAIN [facility] SERVICES [address] [place], [place], [address] Phone: [phone]   Fax:  [phone]  Patient Details  Name: [name] MRN: [id number] Date of Birth: [birth year] Referring Provider:  Harvest Dark, FNP  Encounter Date: 04/03/2020   Patient presents with his son, will be going to urgent care after session for UTI check.  Vitals taken at start of session:  BP: 97/57 pulse 73   Standing BP: 95/73    Due to orthostatic diastolic change in combination with altered mental status and suspected UTI patient's son and patient agreeable to go to urgent care immediately rather than wait till end of session. Educated on safe transfers with orthostasis including 5 breaths in standing position.   Janna Arch, PT, DPT   04/03/2020, 9:40 AM  [place] MAIN [facility] SERVICES [address] [place], [place], [address] Phone: [phone]   Fax:  [phone]

## 2020-04-04 ENCOUNTER — Other Ambulatory Visit: Payer: Self-pay | Admitting: Neurology

## 2020-04-04 ENCOUNTER — Emergency Department: Payer: Medicare Other

## 2020-04-04 ENCOUNTER — Other Ambulatory Visit: Payer: Self-pay

## 2020-04-04 ENCOUNTER — Emergency Department
Admission: EM | Admit: 2020-04-04 | Discharge: 2020-04-04 | Disposition: A | Discharge status: Home / Self Care | Payer: Medicare Other | Attending: Emergency Medicine | Admitting: Emergency Medicine

## 2020-04-04 DIAGNOSIS — S199XXA Unspecified injury of neck, initial encounter: Secondary | ICD-10-CM | POA: Diagnosis not present

## 2020-04-04 DIAGNOSIS — Y929 Unspecified place or not applicable: Secondary | ICD-10-CM | POA: Insufficient documentation

## 2020-04-04 DIAGNOSIS — R079 Chest pain, unspecified: Secondary | ICD-10-CM | POA: Insufficient documentation

## 2020-04-04 DIAGNOSIS — S6991XA Unspecified injury of right wrist, hand and finger(s), initial encounter: Secondary | ICD-10-CM | POA: Diagnosis present

## 2020-04-04 DIAGNOSIS — R410 Disorientation, unspecified: Secondary | ICD-10-CM | POA: Insufficient documentation

## 2020-04-04 DIAGNOSIS — Y999 Unspecified external cause status: Secondary | ICD-10-CM | POA: Diagnosis not present

## 2020-04-04 DIAGNOSIS — I10 Essential (primary) hypertension: Secondary | ICD-10-CM | POA: Insufficient documentation

## 2020-04-04 DIAGNOSIS — R413 Other amnesia: Secondary | ICD-10-CM | POA: Diagnosis not present

## 2020-04-04 DIAGNOSIS — S60222A Contusion of left hand, initial encounter: Secondary | ICD-10-CM | POA: Diagnosis not present

## 2020-04-04 DIAGNOSIS — J449 Chronic obstructive pulmonary disease, unspecified: Secondary | ICD-10-CM | POA: Diagnosis not present

## 2020-04-04 DIAGNOSIS — E569 Vitamin deficiency, unspecified: Secondary | ICD-10-CM

## 2020-04-04 DIAGNOSIS — R251 Tremor, unspecified: Secondary | ICD-10-CM | POA: Diagnosis not present

## 2020-04-04 DIAGNOSIS — S0990XA Unspecified injury of head, initial encounter: Secondary | ICD-10-CM | POA: Insufficient documentation

## 2020-04-04 DIAGNOSIS — F0280 Dementia in other diseases classified elsewhere without behavioral disturbance: Secondary | ICD-10-CM

## 2020-04-04 DIAGNOSIS — S60221A Contusion of right hand, initial encounter: Secondary | ICD-10-CM | POA: Diagnosis not present

## 2020-04-04 DIAGNOSIS — Y939 Activity, unspecified: Secondary | ICD-10-CM | POA: Diagnosis not present

## 2020-04-04 DIAGNOSIS — W01198A Fall on same level from slipping, tripping and stumbling with subsequent striking against other object, initial encounter: Secondary | ICD-10-CM | POA: Insufficient documentation

## 2020-04-04 DIAGNOSIS — W19XXXA Unspecified fall, initial encounter: Secondary | ICD-10-CM

## 2020-04-04 DIAGNOSIS — I2510 Atherosclerotic heart disease of native coronary artery without angina pectoris: Secondary | ICD-10-CM | POA: Diagnosis not present

## 2020-04-04 HISTORY — DX: Dementia in other diseases classified elsewhere, unspecified severity, without behavioral disturbance, psychotic disturbance, mood disturbance, and anxiety: F02.80

## 2020-04-04 LAB — BASIC METABOLIC PANEL
Anion gap: 8 (ref 5–15)
BUN: 14 mg/dL (ref 8–23)
CO2: 24 mmol/L (ref 22–32)
Calcium: 9.2 mg/dL (ref 8.9–10.3)
Chloride: 107 mmol/L (ref 98–111)
Creatinine, Ser: 0.95 mg/dL (ref 0.61–1.24)
GFR calc Af Amer: >60 mL/min (ref >60)
GFR calc non Af Amer: >60 mL/min (ref >60)
Glucose, Bld: 112 mg/dL — ABNORMAL HIGH (ref 70–99)
Potassium: 4.1 mmol/L (ref 3.5–5.1)
Sodium: 139 mmol/L (ref 135–145)

## 2020-04-04 LAB — CBC
HCT: 38.2 % — ABNORMAL LOW (ref 39.0–52.0)
Hemoglobin: 12.6 g/dL — ABNORMAL LOW (ref 13.0–17.0)
MCH: 30.6 pg (ref 26.0–34.0)
MCHC: 33 g/dL (ref 30.0–36.0)
MCV: 92.7 fL (ref 80.0–100.0)
Platelets: 158 10*3/uL (ref 150–400)
RBC: 4.12 MIL/uL — ABNORMAL LOW (ref 4.22–5.81)
RDW: 14.2 % (ref 11.5–15.5)
WBC: 7.5 10*3/uL (ref 4.0–10.5)
nRBC: 0 % (ref 0.0–0.2)

## 2020-04-04 NOTE — ED Notes (Signed)
Old chart reviewed by PA  States he needs labs prior to coming to treatment room

## 2020-04-04 NOTE — ED Notes (Signed)
MD at bedside to update patient and family

## 2020-04-04 NOTE — ED Provider Notes (Signed)
 [facility] Emergency Department Provider Note  ____________________________________________   First MD Initiated Contact with Patient 04/04/20 2107     (approximate)  I have reviewed the triage vital signs and the nursing notes.   HISTORY  Chief Complaint Fall   HPI [name] is a 86 y.o. male with a past medical history of CAD status post gastric bypass currently on Eliquis, COPD, abdominal AAA, GERD, and dementia who presents for assessment after a fall earlier today that occurred in his home while he was playing golf i.e. putting and lost his balance and struck his head against the wall.  Patient also states he fell onto both hands but does not currently have any significant pain anywhere.  Patient did not have LOC.  He has chronic diarrhea from his dementia medications but has not had any recent vomiting, vision changes, chest pain, cough, shortness of breath, urinary problems, rash, or other sick symptoms.  Patient is accompanied by his son who assist in providing history notes the patient is currently working with a neurologist to manage his dementia as well as chronic balance issues.         Past Medical History:  Diagnosis Date  . AAA (abdominal aortic aneurysm) (HCC) 05/2019   Relativley Rapid progression from June-Aug 2020 (4.6 - 5.4 cm): s/p EVAR (Dr. Dew - [facility]): EVAR - 23 mm prox, 12 cm distal & x 18 cm length Gore Excluder Endoprosthesis Main body (via RFA distal to lowest Renal A); 12mm x 14 cm L Contralateral Limb for L Iliac - extended with 8 mm x 6cm LifeStar stent (post-dilated with 7 mm DEB).  Additional R Iliac PTA not required.   . Arthritis   . Basal cell carcinoma of eyelid 01/03/2013   2019 - R side of Nose  . Benign neoplasm of ascending colon   . Benign neoplasm of descending colon   . Bilateral iliac artery stenosis (HCC) 2007   Bilateral Iliac A stenting; extension of EVAR limbs into both Iliacs -- additional stent  placed in L Iliac.  . Cancer (HCC)    Skin cancer  . Colon polyps   . Complication of anesthesia    severe confusion agitation requiring hospital admission x 4 days  . Compression fracture of fourth lumbar vertebra (HCC) 04/2019   - s/p Kyphoplasty; T12, L3-5 (*[facility])  . COPD (chronic obstructive pulmonary disease) (HCC)    Centrilobular Emphysema  . Coronary artery disease involving native coronary artery without angina pectoris 1998   - s/p CABG, occluded SVG-D1; patent LIMA-LAD, SVG-OM, SVG- RCA  . Dementia (HCC)    memory loss - started noting late 2019  . Falls frequently    broke foot  . Fracture 2021   left foot  . GERD (gastroesophageal reflux disease)   . Hemorrhoid 02/10/2015  . History of hiatal hernia   . History of kidney stones   . HOH (hard of hearing)   . Hypertension   . Macular degeneration disease   . Osteoporosis   . PAF (paroxysmal atrial fibrillation) (HCC)    on Eliquis for OAC - Rate Control   . Prostate cancer (HCC)   . Prostate disorder   . Sciatic pain    Chronic  . Sleep apnea    cpap  . Temporary cerebral vascular dysfunction 02/10/2015   Had negative work-up.  July, 2012.  No medication changes, had forgot them that day, got dehydrated.   . Urinary incontinence       Patient Active Problem List   Diagnosis Date Noted  . Hematuria   . Acute kidney injury superimposed on CKD ([place])   . Sepsis due to gram-negative UTI ([place]) 12/26/2019  . Left nephrolithiasis 12/26/2019  . Hydronephrosis, left 12/26/2019  . Status post abdominal aortic aneurysm (AAA) repair 04/15/2019  . Candidiasis 02/22/2019  . Malignant neoplasm of descending colon ([place])   . Benign neoplasm of descending colon   . Benign neoplasm of transverse colon   . Do not intubate, cardiopulmonary resuscitation (CPR)-only code status 10/08/2018  . Prostate cancer ([place]) 02/25/2018  . Prostatic cyst 01/15/2018  . 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 ([place]) 09/15/2017  . Blood in stool   .  Abnormal feces   . Stricture and stenosis of esophagus   . Mild cognitive impairment 08/18/2017  . Senile purpura ([place]) 04/29/2017  . Anemia 04/29/2017  . Frequent falls 04/29/2017  . Simple chronic bronchitis ([place]) 12/17/2016  . Benign localized hyperplasia of prostate with urinary obstruction 07/15/2016  . Elevated PSA 07/15/2016  . Incomplete emptying of bladder 07/15/2016  . Urge incontinence 07/15/2016  . Hypothyroidism 04/25/2016  . Macular degeneration 04/25/2016  . Erectile dysfunction due to arterial insufficiency   . Ischemic heart disease with chronotropic incompetence   . CN (constipation) 02/10/2015  . DD (diverticular disease) 02/10/2015  . Weakness 02/10/2015  . Lichen planus 96/28/3662  . Restless leg 02/10/2015  . 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 B12 deficiency 02/10/2015  . COPD (chronic obstructive pulmonary disease) ([place]) 11/14/2014  . Post Inflammatory Lung Changes 11/14/2014  . Acute delirium 11/08/2014  . PAF (paroxysmal atrial fibrillation) (HCC) CHA2DS2-VASc = 4. AC = Eliquis   . Insomnia 12/09/2013  . OSA on CPAP   . Dyspnea on exertion - -essentially resolved with BB dose reduction & wgt loss 03/29/2013  . Overweight (BMI 25.0-29.9) -- Wgt back up 01/17/2013  . Atherosclerotic heart disease of native coronary artery without angina pectoris - s/p CABG, occluded SVG-D1; patent LIMA-LAD, SVG-OM, SVG- RCA   . Essential hypertension   . Hyperlipidemia with target LDL less than 70   . Aorto-iliac disease ([place])   . BCC (basal cell carcinoma), eyelid 01/03/2013  . Allergic rhinitis 08/17/2009  . Adaptation reaction 05/28/2009  . Acid reflux 05/28/2009  . Arthritis, degenerative 05/28/2009  . 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 Carotid artery occlusion without infarction 06/01/2006  . PAD (peripheral artery disease) - bilateral common iliac stents 12/15/2005  . Atherosclerotic heart disease of artery bypass graft 09/01/1997    Past  Surgical History:  Procedure Laterality Date  . ABDOMINAL AORTIC ENDOVASCULAR STENT GRAFT Bilateral 05/18/2019   Procedure: ABDOMINAL AORTIC ENDOVASCULAR STENT GRAFT;  Surgeon: Algernon Huxley, MD;  Location: [facility] ORS;  Service: Vascular;  Laterality: Bilateral;  . ANKLE SURGERY Left    ORIF   . Arch aortogram and carotid aortogram  06/02/2006   Dr Oneida Alar did surgery  . BASAL CELL CARCINOMA EXCISION  04/2016   Dermatology  . CARDIAC CATHETERIZATION  01/30/1998    (Dr. Linard Millers): Native LAD & mRCA CTO. 90% D1. Mod Cx. SVG-D1 CTO. SVG-RCA & SVG-OM along with LIMA-LAD patent;  LV NORMAL Fxn  . CAROTID ENDARTERECTOMY Left Oct. 29, 2007   Dr. Oneida Alar:  . CATARACT EXTRACTION W/PHACO Right 03/05/2017   Procedure: CATARACT EXTRACTION PHACO AND INTRAOCULAR LENS PLACEMENT (IOC);  Surgeon: Leandrew Koyanagi, MD;  Location: [facility] ORS;  Service: Ophthalmology;  Laterality: Right;  [place] 00:58.5 AP% 10.6 CDE 6.20 Fluid Pack lot #  [id number]  . CATARACT EXTRACTION W/PHACO Left 04/15/2017   Procedure: CATARACT EXTRACTION PHACO AND INTRAOCULAR LENS PLACEMENT (IOC)  Left;  Surgeon: Brasington, Chadwick, MD;  Location: [facility];  Service: Ophthalmology;  Laterality: Left;  . COLONOSCOPY WITH PROPOFOL N/A 09/15/2017   Procedure: COLONOSCOPY WITH PROPOFOL;  Surgeon: Wohl, Darren, MD;  Location: [facility] END[facility];  Service: Endoscopy;  Laterality: N/A;  . COLONOSCOPY WITH PROPOFOL N/A 10/26/2018   Procedure: COLONOSCOPY WITH PROPOFOL;  Surgeon: Wohl, Darren, MD;  Location: [facility];  Service: Endoscopy;  Laterality: N/A;  . CORONARY ARTERY BYPASS GRAFT  1998   LIMA-LAD, SVG-OM, SVG-RPDA, SVG-DIAG  . CPET / MET  12/2012   Mild chronotropic incompetence - read 82% of predicted; also reduced effort; peak VO2 15.7 / 75% (did not reach Max effort) -- suggested ischemic response in last 1.5 minutes of exercise. Normal pulmonary function on PFTs but poor response to  . CYSTOSCOPY W/ RETROGRADES Left 01/13/2020    Procedure: CYSTOSCOPY WITH RETROGRADE PYELOGRAM;  Surgeon: Sninsky, Brian C, MD;  Location: [facility];  Service: Urology;  Laterality: Left;  . CYSTOSCOPY WITH STENT PLACEMENT Left 12/26/2019   Procedure: CYSTOSCOPY WITH STENT PLACEMENT;  Surgeon: Sninsky, Brian C, MD;  Location: [facility];  Service: Urology;  Laterality: Left;  . CYSTOSCOPY/URETEROSCOPY/HOLMIUM LASER/STENT PLACEMENT Left 01/13/2020   Procedure: CYSTOSCOPY/URETEROSCOPY/HOLMIUM LASER/STENT EXCHANGE;  Surgeon: Sninsky, Brian C, MD;  Location: [facility];  Service: Urology;  Laterality: Left;  . ENDOVASCULAR REPAIR/STENT GRAFT N/A 05/18/2019   Procedure: ENDOVASCULAR REPAIR/STENT GRAFT;  Surgeon: Dew, Jason S, MD;  Location: [facility];; EVAR - 23 mm prox, 12 cm distal & x 18 cm length Gore Excluder Endoprosthesis Main body (via RFA distal to lowest Renal A); 12mm x 14 cm L Contralateral Limb for L Iliac - extended with 8 mm x 6cm LifeStar stent; no additional R Iliac PTA needed.   . ESOPHAGOGASTRODUODENOSCOPY (EGD) WITH PROPOFOL N/A 09/15/2017   Procedure: ESOPHAGOGASTRODUODENOSCOPY (EGD) WITH PROPOFOL;  Surgeon: Wohl, Darren, MD;  Location: [facility];  Service: Endoscopy;  Laterality: N/A;  . FRACTURE SURGERY Right 03/19/2015   wrist  . FRACTURE SURGERY Left   . HIATAL HERNIA REPAIR    . ILIAC ARTERY STENT  12/15/2005   PTA and direct stenting rgt and lft common iliac arteries  . KYPHOPLASTY N/A 04/05/2019   Procedure: KYPHOPLASTY T12, L3, L4 L5;  Surgeon: Menz, Michael, MD;  Location: [facility];  Service: Orthopedics;  Laterality: N/A;  . NM GATED MYOVIEW ([facility] HX)  05/'19; 8/'20   a) [facility]: Low Risk - no ischemia or infarct.  EF > 65%. no RWMA;; b) ([facility]): Normal wall motion.  No ischemia or infarction.  . OPEN REDUCTION INTERNAL FIXATION (ORIF) DISTAL RADIAL FRACTURE Left 01/13/2019   Procedure: OPEN REDUCTION INTERNAL FIXATION (ORIF) DISTAL  RADIAL FRACTURE - LEFT - SLEEP APNEA;  Surgeon: Menz, Michael, MD;   Location: [facility] OR[facility];  Service: Orthopedics;  Laterality: Left;  . ORIF ELBOW FRACTURE Right 01/01/2017   Procedure: OPEN REDUCTION INTERNAL FIXATION (ORIF) ELBOW/OLECRANON FRACTURE;  Surgeon: Michael Menz, MD;  Location: [facility];  Service: Orthopedics;  Laterality: Right;  . ORIF WRIST FRACTURE Right 03/19/2015   Procedure: OPEN REDUCTION INTERNAL FIXATION (ORIF) WRIST FRACTURE;  Surgeon: Michael Menz, MD;  Location: [facility];  Service: Orthopedics;  Laterality: Right;  . SHOULDER ARTHROSCOPY Right   . THORACIC AORTA - CAROTID ANGIOGRAM  October 2007   Dr. 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 TRANSTHORACIC ECHOCARDIOGRAM  February 2016   [facility]: Normal LV function. Dilated left atrium.  Domingo Dimes ECHOCARDIOGRAM  04/2019   [facility] April 20, 2019): EF 55-60 %.  Mild LVH.  Relatively normal valves.    Prior to Admission medications   Medication Sig Start Date End Date Taking? Authorizing Provider  acetaminophen (TYLENOL) 500 MG tablet Take 500-1,000 mg by mouth every 6 (six) hours as needed for mild pain or moderate pain.     [provider]  Ascorbic Acid (VITAMIN C) 1000 MG tablet Take 1,000 mg by mouth daily.    [provider]  Cyanocobalamin (VITAMIN B-12) 500 MCG SUBL Place 500 mcg under the tongue daily.     [provider]  ELIQUIS 5 MG TABS tablet TAKE 1 TABLET BY MOUTH TWICE DAILY 01/14/19   Leonie Man, MD  ezetimibe (ZETIA) 10 MG tablet Take 1 tablet (10 mg total) by mouth daily. 04/19/19   [name], MD  finasteride (PROSCAR) 5 MG tablet TAKE ONE TABLET BY MOUTH EVERY DAY 01/02/20   Stoioff, Ronda Fairly, MD  fluticasone (FLONASE) 50 MCG/ACT nasal spray PLACE TWO SPRAYS IN BOTH NOSRTILS EVERY DAY AS NEEDED FOR ALLERGIES 10/28/19   [name], MD  lisinopril (ZESTRIL) 2.5 MG tablet Take 1 tablet (2.5 mg total) by mouth daily. 01/02/20 04/01/20   Leonie Man, MD  Magnesium Oxide 500 MG TABS Take 500 mg by mouth every evening.    [provider]  metoprolol tartrate (LOPRESSOR) 25 MG tablet Take 1 tablet (25 mg total) by mouth 2 (two) times daily. *OFFICE VISIT NEEDED FOR FURTHER REFILLS* 05/06/19   Leonie Man, MD  Multiple Vitamins-Minerals (PRESERVISION AREDS 2 PO) Take 1 tablet by mouth 2 (two) times daily.     [provider]  MYRBETRIQ 50 MG TB24 tablet TAKE ONE TABLET BY MOUTH EVERY DAY 01/02/20   Stoioff, Ronda Fairly, MD  nitroGLYCERIN (NITROSTAT) 0.4 MG SL tablet Place 1 tablet (0.4 mg total) under the tongue every 5 (five) minutes as needed for chest pain. 01/12/17   [name], MD  pantoprazole (PROTONIX) 40 MG tablet TAKE ONE TABLET BY MOUTH EVERY DAY 11/02/19   [name], MD  rosuvastatin (CRESTOR) 20 MG tablet TAKE ONE TABLET BY MOUTH EVERY DAY 10/01/19   [name], MD  tamsulosin (FLOMAX) 0.4 MG CAPS capsule Take 1 capsule (0.4 mg total) by mouth every evening. 06/07/19   Stoioff, Ronda Fairly, MD  umeclidinium-vilanterol (ANORO ELLIPTA) 62.5-25 MCG/INH AEPB Inhale 1 puff into the lungs daily.  10/25/19   [provider]    Allergies Clonazepam, Codeine, Ropinirole, Hydrocodone, Niacin and related, Oxycodone, and Tramadol  Family History  Problem Relation Age of Onset  . Ulcers Mother        Peptic  . Dementia Mother   . Alcohol abuse Father     Social History Social History   Tobacco Use  . Smoking status: Former Smoker    Types: Cigarettes    Quit date: 08/04/1997    Years since quitting: 22.6  . Smokeless tobacco: Never Used  Vaping Use  . Vaping Use: Never used  Substance Use Topics  . Alcohol use: No  . Drug use: No    Review of Systems  Review of Systems  Constitutional: Negative for chills and fever.  HENT: Negative for sore throat.   Eyes: Negative for pain.  Respiratory: Negative for cough and  stridor.   Cardiovascular: Negative for chest pain.   Gastrointestinal: Negative for vomiting.  Skin: Negative for rash.  Neurological: Positive for tremors. Negative for seizures, loss of consciousness and headaches.  Psychiatric/Behavioral: Positive for memory loss. Negative for suicidal ideas.  All other systems reviewed and are negative.     ____________________________________________   PHYSICAL EXAM:  VITAL SIGNS: ED Triage Vitals  Enc Vitals Group     BP 04/04/20 1721 121/88     Pulse Rate 04/04/20 1721 67     Resp 04/04/20 1721 18     Temp 04/04/20 1721 97.8 F (36.6 C)     Temp src --      SpO2 04/04/20 1721 96 %     Weight --      Height --      Head Circumference --      Peak Flow --      Pain Score 04/04/20 1722 5     Pain Loc --      Pain Edu? --      Excl. in GC? --    Vitals:   04/04/20 1721  BP: 121/88  Pulse: 67  Resp: 18  Temp: 97.8 F (36.6 C)  SpO2: 96%   Physical Exam Vitals and nursing note reviewed.  Constitutional:      Appearance: He is well-developed.  HENT:     Head: Normocephalic and atraumatic.     Right Ear: External ear normal.     Left Ear: External ear normal.     Nose: Nose normal.  Eyes:     Conjunctiva/sclera: Conjunctivae normal.  Cardiovascular:     Rate and Rhythm: Normal rate and regular rhythm.     Heart sounds: No murmur heard.   Pulmonary:     Effort: Pulmonary effort is normal. No respiratory distress.     Breath sounds: Normal breath sounds.  Abdominal:     Palpations: Abdomen is soft.     Tenderness: There is no abdominal tenderness.  Musculoskeletal:     Cervical back: Neck supple.  Skin:    General: Skin is warm and dry.  Neurological:     General: No focal deficit present.     Mental Status: He is alert. Mental status is at baseline. He is confused.  Psychiatric:        Mood and Affect: Mood normal.     No tenderness over the C/T/L-spine.  Cranial nerves II through XII grossly intact.  5/5 strength of all extremities.  Patient has no tenderness  and there are no deformities or effusion of the bilateral shoulders, elbows, wrists, hips, knees, or ankles.  Patient was observed to ambulate with steady gait unassisted.  He does have some mild bruising over the dorsum of his bilateral hands.  5/5 grip strength with bilateral hands.  Sensation intact to light touch lower extremities.  2+ bilateral radial pulses. ____________________________________________   LABS (all labs ordered are listed, but only abnormal results are displayed)  Labs Reviewed  BASIC METABOLIC PANEL - Abnormal; Notable for the following components:      Result Value   Glucose, Bld 112 (*)    All other components within normal limits  CBC - Abnormal; Notable for the following components:   RBC 4.12 (*)    Hemoglobin 12.6 (*)    HCT 38.2 (*)    All other components within normal limits  URINALYSIS, COMPLETE (UACMP) WITH MICROSCOPIC   ____________________________________________  EKG  Sinus rhythm with a ventricular rate of   75, incomplete right bundle branch block, T wave inversions and depressions in the anterior leads which are present on prior EKGs without other evidence of acute ischemia or other significant underlying arrhythmia. ____________________________________________  RADIOLOGY  ED MD interpretation: CT head and C-spine are unremarkable.  Official radiology report(s): DG Chest 2 View  Result Date: 04/04/2020 CLINICAL DATA:  Fall.  Chest pain. EXAM: CHEST - 2 VIEW COMPARISON:  07/03/2019 FINDINGS: Previous median sternotomy and CABG. Heart size is normal. Ordinary aortic atherosclerotic calcification. Chronic pulmonary scarring as seen chronically. Calcified granuloma in the right upper lobe. No infiltrate, mass, effusion or collapse. Old lower thoracic compression fracture with previous augmentation. No acute regional fracture is seen. IMPRESSION: No acute or traumatic finding. Previous CABG. Aortic atherosclerosis. Chronic pulmonary scarring. Right  upper lobe granuloma. Old augmented lower thoracic fracture. Electronically Signed   By: Mark  Shogry M.D.   On: 04/04/2020 21:47   CT Head Wo Contrast  Result Date: 04/04/2020 CLINICAL DATA:  Fell playing golf.  Trauma to the head and neck. EXAM: CT HEAD WITHOUT CONTRAST CT CERVICAL SPINE WITHOUT CONTRAST TECHNIQUE: Multidetector CT imaging of the head and cervical spine was performed following the standard protocol without intravenous contrast. Multiplanar CT image reconstructions of the cervical spine were also generated. COMPARISON:  01/06/2019 FINDINGS: CT HEAD FINDINGS Brain: Age related atrophy. Chronic small-vessel ischemic changes of the white matter. Old lacunar infarctions thalami and basal ganglia. No hemorrhage, hydrocephalus or extra-axial collection. Vascular: There is atherosclerotic calcification of the major vessels at the base of the brain. Skull: No skull fracture. Sinuses/Orbits: Clear/normal Other: None CT CERVICAL SPINE FINDINGS Alignment: No traumatic malalignment. Skull base and vertebrae: No fracture. Soft tissues and spinal canal: No traumatic soft tissue finding. Disc levels: Chronic degenerative spondylosis throughout the cervical region with disc space narrowing and endplate osteophytes. Facet osteoarthritis most pronounced on the right at C3-4 and on the left at C2-3, C3-4 and C4-5. Chronic bilateral bony foraminal narrowing. Upper chest: Negative except for pleural and parenchymal scarring. Other: None IMPRESSION: Head CT: No acute or traumatic finding. Atrophy and chronic small-vessel ischemic changes as above. Cervical spine CT: No acute or traumatic finding. Chronic degenerative spondylosis and facet osteoarthritis. Electronically Signed   By: Mark  Shogry M.D.   On: 04/04/2020 18:04   CT Cervical Spine Wo Contrast  Result Date: 04/04/2020 CLINICAL DATA:  Fell playing golf.  Trauma to the head and neck. EXAM: CT HEAD WITHOUT CONTRAST CT CERVICAL SPINE WITHOUT CONTRAST  TECHNIQUE: Multidetector CT imaging of the head and cervical spine was performed following the standard protocol without intravenous contrast. Multiplanar CT image reconstructions of the cervical spine were also generated. COMPARISON:  01/06/2019 FINDINGS: CT HEAD FINDINGS Brain: Age related atrophy. Chronic small-vessel ischemic changes of the white matter. Old lacunar infarctions thalami and basal ganglia. No hemorrhage, hydrocephalus or extra-axial collection. Vascular: There is atherosclerotic calcification of the major vessels at the base of the brain. Skull: No skull fracture. Sinuses/Orbits: Clear/normal Other: None CT CERVICAL SPINE FINDINGS Alignment: No traumatic malalignment. Skull base and vertebrae: No fracture. Soft tissues and spinal canal: No traumatic soft tissue finding. Disc levels: Chronic degenerative spondylosis throughout the cervical region with disc space narrowing and endplate osteophytes. Facet osteoarthritis most pronounced on the right at C3-4 and on the left at C2-3, C3-4 and C4-5. Chronic bilateral bony foraminal narrowing. Upper chest: Negative except for pleural and parenchymal scarring. Other: None IMPRESSION: Head CT: No acute or traumatic finding. Atrophy and chronic small-vessel ischemic changes   as above. Cervical spine CT: No acute or traumatic finding. Chronic degenerative spondylosis and facet osteoarthritis. Electronically Signed   By: Nelson Chimes M.D.   On: 04/04/2020 18:04   DG Hand 2 View Right  Result Date: 04/04/2020 CLINICAL DATA:  86 year old male with fall. EXAM: RIGHT HAND - 2 VIEW COMPARISON:  Left hand radiograph dated 04/04/2020. FINDINGS: There is no acute fracture or dislocation. The bones are osteopenic. Distal radial fixation plate and screws. Osteoarthritic changes of the base of the thumb. The soft tissues are grossly unremarkable. IMPRESSION: 1. No acute fracture or dislocation. 2. Osteopenia with osteoarthritic changes of the base of the thumb.  Electronically Signed   By: Anner Crete M.D.   On: 04/04/2020 21:48   DG Hand 2 View Left  Result Date: 04/04/2020 CLINICAL DATA:  86 year old male with fall and left hand pain. EXAM: LEFT HAND - 2 VIEW COMPARISON:  Right hand radiograph dated 04/04/2020. left wrist radiograph dated 01/13/2019 FINDINGS: There is no acute fracture or dislocation. The bones are osteopenic. Partially visualized distal radial fixation hardware. There is old fracture of the ulnar styloid with nonunion. Osteoarthritic changes of the base of the thumb. The soft tissues are unremarkable. IMPRESSION: 1. No acute fracture or dislocation. 2. Osteopenia. Electronically Signed   By: Anner Crete M.D.   On: 04/04/2020 21:49    ____________________________________________   PROCEDURES  Procedure(s) performed (including Critical Care):  Procedures   ____________________________________________   INITIAL IMPRESSION / ASSESSMENT AND PLAN / ED COURSE        Patient presents with above-stated history exam after a fall described above.  Afebrile hemodynamically stable arrival.  Patient denies any pain and has no focal deficits or evidence of trauma on exam CT head obtained given patient has a dementia and is anticoagulated.  No evidence of intracranial bleeding or skull fracture.  C-spine is unremarkable for any evidence of acute injury patient has no tenderness on exam suggestive of occult fracture.  Chest x-ray is unremarkable as are x-rays of the bilateral hands for evidence of acute fracture dislocation or other significant injury.  Low suspicion for occult visceral injury at this time and given patient is at his neurological baseline per son was able to ambulate with otherwise reassuring work-up I do believe he is safe for discharge with plan for close outpatient follow-up.  Patient discharged in stable condition.  Strict return precautions advised and discussed.           ____________________________________________   FINAL CLINICAL IMPRESSION(S) / ED DIAGNOSES  Final diagnoses:  Fall, initial encounter     ED Discharge Orders    None       Note:  This document was prepared using Dragon voice recognition software and may include unintentional dictation errors.   Lucrezia Starch, MD 04/04/20 [phone]

## 2020-04-04 NOTE — ED Notes (Signed)
Pt taken to car via wheelchair. NAD at this time. Pt assisted into car and no questions per family or patient.

## 2020-04-04 NOTE — ED Notes (Signed)
Pt up to the bathroom without distress. Pt asking for family to assist him to bathroom and family verbalized feeling okay with this plan. Pt in NAD at this time.

## 2020-04-04 NOTE — ED Triage Notes (Signed)
Pt reports he was playing a golfing game, when swinging club he lost balance falling and hitting head on wall. No swelling or obvious injuries noted to the head. Takes eliquis. Denies any visual changes, n/v. NAD noted at this time.

## 2020-04-05 ENCOUNTER — Ambulatory Visit: Payer: Medicare Other

## 2020-04-05 DIAGNOSIS — M545 Low back pain, unspecified: Secondary | ICD-10-CM

## 2020-04-05 DIAGNOSIS — G8929 Other chronic pain: Secondary | ICD-10-CM | POA: Diagnosis present

## 2020-04-05 DIAGNOSIS — M6281 Muscle weakness (generalized): Secondary | ICD-10-CM | POA: Diagnosis present

## 2020-04-05 DIAGNOSIS — R2681 Unsteadiness on feet: Secondary | ICD-10-CM

## 2020-04-05 DIAGNOSIS — R262 Difficulty in walking, not elsewhere classified: Secondary | ICD-10-CM

## 2020-04-05 DIAGNOSIS — R2689 Other abnormalities of gait and mobility: Secondary | ICD-10-CM

## 2020-04-05 NOTE — Therapy (Signed)
[place] MAIN [facility] SERVICES [address] [place], [place], [address] Phone: [phone]   Fax:  [phone]  Physical Therapy Treatment  Patient Details  Name: [name] MRN: [id number] Date of Birth: 1934/06/07 Referring Provider (PT): Meeler, Sherren Kerns FNP   Encounter Date: 04/05/2020   PT End of Session - 04/05/20 1212    Visit Number 7    Number of Visits 16    Date for PT Re-Evaluation 04/24/20    Authorization Type 7/10 eval 6/29    PT Start Time 0930    PT Stop Time 1015    PT Time Calculation (min) 45 min    Equipment Utilized During Treatment Gait belt    Activity Tolerance Patient tolerated treatment well;Patient limited by fatigue    Behavior During Therapy WFL for tasks assessed/performed           Past Medical History:  Diagnosis Date   AAA (abdominal aortic aneurysm) ([place]) 05/2019   [place] Rapid progression from June-Aug 2020 (4.6 - 5.4 cm): s/p EVAR (Dr. Lucky Cowboy [facility]): EVAR - 23 mm prox, 12 cm distal & x 18 cm length Gore Excluder Endoprosthesis Main body (via RFA distal to lowest Renal A); 20m x 14 cm L Contralateral Limb for L Iliac - extended with 8 mm x 6cm LifeStar stent (post-dilated with 7 mm DEB).  Additional R Iliac PTA not required.    Arthritis    Basal cell carcinoma of eyelid 01/03/2013   2019 - R side of Nose   Benign neoplasm of ascending colon    Benign neoplasm of descending colon    Bilateral iliac artery stenosis ([place] 2007   Bilateral Iliac A stenting; extension of EVAR limbs into both Iliacs -- additional stent placed in L Iliac.   Cancer ([facility]    Skin cancer   Colon polyps    Complication of anesthesia    severe confusion agitation requiring hospital admission x 4 days   Compression fracture of fourth lumbar vertebra ([place] 04/2019   - s/p Kyphoplasty; T12, L3-5 (*[facility])   COPD (chronic obstructive pulmonary disease) (HCC)    Centrilobular Emphysema   Coronary artery  disease involving native coronary artery without angina pectoris 1998   - s/p CABG, occluded SVG-D1; patent LIMA-LAD, SVG-OM, SVG- RCA   Dementia ([place]    memory loss - started noting late 2019   Falls frequently    broke foot   Fracture 2021   left foot   GERD (gastroesophageal reflux disease)    Hemorrhoid 02/10/2015   History of hiatal hernia    History of kidney stones    HOH (hard of hearing)    Hypertension    Macular degeneration disease    Osteoporosis    PAF (paroxysmal atrial fibrillation) (HCC)    on Eliquis for OAC - Rate Control    Prostate cancer (HCC)    Prostate disorder    Sciatic pain    Chronic   Sleep apnea    cpap   Temporary cerebral vascular dysfunction 02/10/2015   Had negative work-up.  July, 2012.  No medication changes, had forgot them that day, got dehydrated.    Urinary incontinence     Past Surgical History:  Procedure Laterality Date   ABDOMINAL AORTIC ENDOVASCULAR STENT GRAFT Bilateral 05/18/2019   Procedure: ABDOMINAL AORTIC ENDOVASCULAR STENT GRAFT;  Surgeon: DAlgernon Huxley MD;  Location: [facility] ORS;  Service: Vascular;  Laterality: Bilateral;   ANKLE SURGERY Left  ORIF    Arch aortogram and carotid aortogram  06/02/2006   Dr Oneida Alar did surgery   BASAL CELL CARCINOMA EXCISION  04/2016   Dermatology   CARDIAC CATHETERIZATION  01/30/1998    (Dr. Linard Millers): Native LAD & mRCA CTO. 90% D1. Mod Cx. SVG-D1 CTO. SVG-RCA & SVG-OM along with LIMA-LAD patent;  LV NORMAL Fxn   CAROTID ENDARTERECTOMY Left Oct. 29, 2007   Dr. Oneida Alar:   CATARACT EXTRACTION W/PHACO Right 03/05/2017   Procedure: CATARACT EXTRACTION PHACO AND INTRAOCULAR LENS PLACEMENT ([place]);  Surgeon: Leandrew Koyanagi, MD;  Location: [facility] ORS;  Service: Ophthalmology;  Laterality: Right;  [place] 00:58.5 AP% 10.6 CDE 6.20 Fluid Pack lot # [id number]   CATARACT EXTRACTION W/PHACO Left 04/15/2017   Procedure: CATARACT EXTRACTION PHACO AND INTRAOCULAR LENS PLACEMENT ([place])   Left;  Surgeon: Leandrew Koyanagi, MD;  Location: [place];  Service: Ophthalmology;  Laterality: Left;   COLONOSCOPY WITH PROPOFOL N/A 09/15/2017   Procedure: COLONOSCOPY WITH PROPOFOL;  Surgeon: Lucilla Lame, MD;  Location: [facility] ENDOSCOPY;  Service: Endoscopy;  Laterality: N/A;   COLONOSCOPY WITH PROPOFOL N/A 10/26/2018   Procedure: COLONOSCOPY WITH PROPOFOL;  Surgeon: Lucilla Lame, MD;  Location: [facility] ENDOSCOPY;  Service: Endoscopy;  Laterality: N/A;   CORONARY ARTERY BYPASS GRAFT  1998   LIMA-LAD, SVG-OM, SVG-RPDA, SVG-DIAG   CPET / MET  12/2012   Mild chronotropic incompetence - read 82% of predicted; also reduced effort; peak VO2 15.7 / 75% (did not reach Max effort) -- suggested ischemic response in last 1.5 minutes of exercise. Normal pulmonary function on PFTs but poor response to   CYSTOSCOPY W/ RETROGRADES Left 01/13/2020   Procedure: CYSTOSCOPY WITH RETROGRADE PYELOGRAM;  Surgeon: Billey Co, MD;  Location: [facility] ORS;  Service: Urology;  Laterality: Left;   CYSTOSCOPY WITH STENT PLACEMENT Left 12/26/2019   Procedure: CYSTOSCOPY WITH STENT PLACEMENT;  Surgeon: Billey Co, MD;  Location: [facility] ORS;  Service: Urology;  Laterality: Left;   CYSTOSCOPY/URETEROSCOPY/HOLMIUM LASER/STENT PLACEMENT Left 01/13/2020   Procedure: CYSTOSCOPY/URETEROSCOPY/HOLMIUM LASER/STENT EXCHANGE;  Surgeon: Billey Co, MD;  Location: [facility] ORS;  Service: Urology;  Laterality: Left;   ENDOVASCULAR REPAIR/STENT GRAFT N/A 05/18/2019   Procedure: ENDOVASCULAR REPAIR/STENT GRAFT;  Surgeon: Algernon Huxley, MD;  Location: [facility] INVASIVE CV LAB;; EVAR - 23 mm prox, 12 cm distal & x 18 cm length Gore Excluder Endoprosthesis Main body (via RFA distal to lowest Renal A); 67m x 14 cm L Contralateral Limb for L Iliac - extended with 8 mm x 6cm LifeStar stent; no additional R Iliac PTA needed.    ESOPHAGOGASTRODUODENOSCOPY (EGD) WITH PROPOFOL N/A 09/15/2017   Procedure: ESOPHAGOGASTRODUODENOSCOPY  (EGD) WITH PROPOFOL;  Surgeon: WLucilla Lame MD;  [facility];  Service: Endoscopy;  Laterality: N/A;   FRACTURE SURGERY Right 03/19/2015   wrist   FRACTURE SURGERY Left    HIATAL HERNIA REPAIR     ILIAC ARTERY STENT  12/15/2005   PTA and direct stenting rgt and lft common iliac arteries   KYPHOPLASTY N/A 04/05/2019   Procedure: KYPHOPLASTY T12, L3, L4 L5;  Surgeon: MHessie Knows MD;  Location: [facility] ORS;  Service: Orthopedics;  Laterality: N/A;   NM GATED MYOVIEW ([place])  05/'19; 8/'20   a) [facility]: Low Risk - no ischemia or infarct.  EF > 65%. no RWMA;; b) ([facility]: Normal wall motion.  No ischemia or infarction.   OPEN REDUCTION INTERNAL FIXATION (ORIF) DISTAL RADIAL FRACTURE Left 01/13/2019   Procedure: OPEN REDUCTION INTERNAL FIXATION (ORIF) DISTAL  RADIAL FRACTURE - LEFT - SLEEP APNEA;  Surgeon: Hessie Knows, MD;  Location: [facility] ORS;  Service: Orthopedics;  Laterality: Left;   ORIF ELBOW FRACTURE Right 01/01/2017   Procedure: OPEN REDUCTION INTERNAL FIXATION (ORIF) ELBOW/OLECRANON FRACTURE;  Surgeon: Hessie Knows, MD;  Location: [facility] ORS;  Service: Orthopedics;  Laterality: Right;   ORIF WRIST FRACTURE Right 03/19/2015   Procedure: OPEN REDUCTION INTERNAL FIXATION (ORIF) WRIST FRACTURE;  Surgeon: Hessie Knows, MD;  Location: [facility] ORS;  Service: Orthopedics;  Laterality: Right;   SHOULDER ARTHROSCOPY Right    THORACIC AORTA - CAROTID ANGIOGRAM  October 2007   Dr. 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 Dilated left atrium.   TRANSTHORACIC ECHOCARDIOGRAM  04/2019   [facility] April 20, 2019): EF 55-60 %.  Mild LVH.  Relatively normal valves.    There were no vitals filed for this visit.   Subjective Assessment - 04/05/20 0923    Subjective Pt reports he is feeling good this morning; pt's son  accompanied him to session and states pt fell yesterday (backwards, landed against wall) when he decided to try putting golf balls; he went to ER and was evaluated and x-rays were (-) for injury, his hand is a little bruised/sore but no major injuries noted.  Pt's son also states they went to see physician at [facility] and was diagnosed with Lewy Body Dementia and was instructed ot being LSVT as part of PT too.    Pertinent History Patient is a pleasant 86 year old who was last seen by this PT on 12/22/19; since then has been admitted to the hospital and had other medical complications including: two surgeries for kidney stones, another one for his broken L foot. Was in a boot for 6 weeks (boot came off a week ago), and procedures for his back. Has been having hallucinations during the day and night time with increased rate frequently  Pain in back is associated with weakness, had facet joint injections. He has a history of kyphoplasty to T12, L3, L4, L5 performed in August 2020 by Dr. Rudene Christians. MRI from [facility] dated 03/28/2019 report was reviewed today. Acute compression fractures are noted at T12, L3, and L4. He has facet arthropathy at the lower lumbar levels. At L3-4 he has moderate central stenosis. PMH includes AAA, arthritis, atherosclerotic heart disease (s/p CABG), benign neoplasm of ascending/descending colon, COPD, CAD, dementia, dysrhythmia, falls, GERD, hemorrhoid, HOH, HTN, PAF, prostate cancer, osteoporosis, kidney stones, macular degeneration.    Limitations Walking;Standing;House hold activities;Other (comment);Lifting;Sitting    How long can you sit comfortably? 5 minutes    How long can you stand comfortably? needs a walker now, unsteady once standing    How long can you walk comfortably? uses a walker now; can walk in house.    Diagnostic tests [facility] dated 03/28/2019 report was reviewed today. Acute compression fractures are noted at T12, L3, and  L4. He has facet arthropathy at the lower lumbar levels. At L3-4 he has moderate central stenosis.    Patient Stated Goals decrease pain and improve balance.    Pain Onset More than a month ago          Treatment Today: Vitals at start of session 113/63, HR 76 bpm   Seated:  3lb ankle weights -LAQ 15x each LE cues for upright posture -marches  12x with arms crossed Adduction green ball squeeze 15x  Supine:  hamstring lengthening with leg on PT shoulder 60 seconds each LE Popliteal angle lengthening 2x 60 seconds each LE LE rotation 60 seconds with patient reporting relief sciatic nerve glide with leg on PT shoulder 20x  GTB marching with TrA contraction 10x each LE   Standing: with support surface: CGA for stability and safety.  airex pad: -one foot on airex pad one foot on 6' step static hold 45 seconds each position x 2 trials each LE -airex pad 6" step toe taps SUE support 12x each LE with one episode of LOB   Walking with rollator walker- 4 laps around clinic, focused on upright posture, navigating obstacles (PT cues needed for clearing edges of furniture)   Pt educated throughout session about proper posture and technique with exercises. Improved exercise technique, movement at target joints, use of target muscles after min to mod verbal, visual, tactile cues     PT Education - 04/05/20 1211    Education Details exercise technique    Person(s) Educated Patient    Methods Explanation;Demonstration;Tactile cues;Verbal cues    Comprehension Need further instruction;Verbalized understanding;Returned demonstration;Verbal cues required;Tactile cues required            PT Short Term Goals - 02/28/20 1721      PT SHORT TERM GOAL #1   Title Patient will be independent in home exercise program to improve strength/mobility for better functional independence with ADLs.    Baseline 6/29: HEP given    Time 4    Period Weeks    Status New    Target Date 03/27/20      PT  SHORT TERM GOAL #2   Title Patient will perform 5 sit to stand transfers without UE support to increase functional mobility for carryover to community activities.    Baseline 6/29; only able to perform 2    Time 4    Period Weeks    Status New    Target Date 03/27/20             PT Long Term Goals - 02/28/20 1723      PT LONG TERM GOAL #1   Title Patient will increase FOTO score to equal to or greater than 54% to demonstrate statistically significant improvement in mobility and quality of life.    Baseline 6/29: 54%    Time 8    Period Weeks    Status New    Target Date 04/24/20      PT LONG TERM GOAL #2   Title Patient will demonstrate an improved Berg Balance Score of > 36 as to demonstrate improved balance with ADLs such as sitting/standing and transfer balance and reduced fall risk.    Baseline 6/29: 30/56    Time 8    Period Weeks    Status New    Target Date 04/24/20      PT LONG TERM GOAL #3   Title Patient will increase 10 meter walk test to >1.32ms as to improve gait speed for better community ambulation and to reduce fall risk.    Baseline 6/29: 0.54m with RW    Time 8    Period Weeks    Status New    Target Date 04/24/20      PT LONG TERM GOAL #4   Title Patient will increase BLE gross strength to 4+/5 as to improve functional strength for independent gait, increased standing tolerance and increased ADL ability.  Baseline 6/29: see note    Time 8    Period Weeks    Status New    Target Date 04/24/20      PT LONG TERM GOAL #5   Title Patient will be able to perform household work/ chores without increase in symptoms.    Baseline 6/29: unable to perform    Time 8    Period Weeks    Status New    Target Date 04/24/20                 Plan - 04/05/20 1212    Clinical Impression Statement Pt's BP in a safe range upon arrival to complete PT session.  He does require frequent verbal/tactile/visual cues for exercise technique throughout session.   He was most challenged with standing balance exercises on the airex pad.  He would cont to benefit from skilled PT to increase strength/stability for safer ADL performance, decreased fall risk, and increased functional ability.    Personal Factors and Comorbidities Age;Comorbidity 3+;Fitness;Past/Current Experience;Sex;Social Background;Time since onset of injury/illness/exacerbation;Transportation    Comorbidities AAA, arthritis, atherosclerotic heart disease, (s/p CABG), benign neoplasm of ascending/descending colon, COPD, CAD, dementia, dysrhythmia, frequent falls, GERD, hemorrhoid, HOH, HTN, PAF, prostate cancer.    Examination-Activity Limitations Bathing;Bend;Carry;Dressing;Continence;Stairs;Squat;Reach Overhead;Locomotion Level;Lift;Stand;Transfers;Toileting;Bed Mobility    Examination-Participation Restrictions Church;Cleaning;Community Activity;Driving;Interpersonal Relationship;Laundry;Volunteer;Shop;Personal Finances;Meal Prep;Yard Work    [employer] Evolving/Moderate complexity    Rehab Potential Fair    PT Frequency 2x / week    PT Duration 8 weeks    PT Treatment/Interventions ADLs/Self Care Home Management;Aquatic Therapy;Cryotherapy;Moist Heat;Traction;Ultrasound;Therapeutic activities;Functional mobility training;Stair training;Gait training;DME Instruction;Therapeutic exercise;Balance training;Neuromuscular re-education;Patient/family education;Manual techniques;Passive range of motion;Energy conservation;Vestibular;Taping;Biofeedback;Electrical Stimulation;Iontophoresis '4mg'$ /ml Dexamethasone;Dry needling;Visual/perceptual remediation/compensation    PT Next Visit Plan core stability, LE strength, balance    PT Home Exercise Plan see above    Consulted and Agree with Plan of Care Patient           Patient will benefit from skilled therapeutic intervention in order to improve the following deficits and impairments:  Abnormal gait, Cardiopulmonary status  limiting activity, Decreased activity tolerance, Decreased balance, Decreased knowledge of precautions, Decreased endurance, Decreased coordination, Decreased cognition, Decreased knowledge of use of DME, Decreased mobility, Difficulty walking, Decreased safety awareness, Decreased strength, Impaired flexibility, Impaired perceived functional ability, Impaired sensation, Postural dysfunction, Improper body mechanics, Pain, Impaired vision/preception, Decreased range of motion  Visit Diagnosis: Unsteadiness on feet  Muscle weakness (generalized)  Other abnormalities of gait and mobility  Chronic low back pain, unspecified back pain laterality, unspecified whether sciatica present  Difficulty in walking, not elsewhere classified     Problem List Patient Active Problem List   Diagnosis Date Noted   Hematuria    Acute kidney injury superimposed on CKD ([place])    Sepsis due to gram-negative UTI ([place]) 12/26/2019   Left nephrolithiasis 12/26/2019   Hydronephrosis, left 12/26/2019   Status post abdominal aortic aneurysm (AAA) repair 04/15/2019   Candidiasis 02/22/2019   Malignant neoplasm of descending colon (HCC)    Benign neoplasm of descending colon    Benign neoplasm of transverse colon    Do not intubate, cardiopulmonary resuscitation (CPR)-only code status 10/08/2018   Prostate cancer ([place]) 02/25/2018   Prostatic cyst 01/15/2018   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 ([place]) 09/15/2017   Blood in stool    Abnormal feces    Stricture and stenosis of esophagus    Mild cognitive impairment 08/18/2017   Senile purpura ([place]) 04/29/2017   Anemia 04/29/2017   Frequent falls  04/29/2017   Simple chronic bronchitis ([place]) 12/17/2016   Benign localized hyperplasia of prostate with urinary obstruction 07/15/2016   Elevated PSA 07/15/2016   Incomplete emptying of bladder 07/15/2016   Urge incontinence 07/15/2016   Hypothyroidism 04/25/2016   Macular degeneration  04/25/2016   Erectile dysfunction due to arterial insufficiency    Ischemic heart disease with chronotropic incompetence    CN (constipation) 02/10/2015   DD (diverticular disease) 02/10/2015   Weakness 30/03/6225   Lichen planus 33/35/4562   Restless leg 02/10/2015   Circadian rhythm disorder 02/10/2015   B12 deficiency 02/10/2015   COPD (chronic obstructive pulmonary disease) ([place]) 11/14/2014   Post Inflammatory Lung Changes 11/14/2014   Acute delirium 11/08/2014   PAF (paroxysmal atrial fibrillation) (HCC) CHA2DS2-VASc = 4. AC = Eliquis    Insomnia 12/09/2013   OSA on CPAP    Dyspnea on exertion - -essentially resolved with BB dose reduction & wgt loss 03/29/2013   Overweight (BMI 25.0-29.9) -- Wgt back up 01/17/2013   Atherosclerotic heart disease of native coronary artery without angina pectoris - s/p CABG, occluded SVG-D1; patent LIMA-LAD, SVG-OM, SVG- RCA    Essential hypertension    Hyperlipidemia with target LDL less than 70    Aorto-iliac disease ([place])    BCC (basal cell carcinoma), eyelid 01/03/2013   Allergic rhinitis 08/17/2009   Adaptation reaction 05/28/2009   Acid reflux 05/28/2009   Arthritis, degenerative 05/28/2009   Abnormality of aortic arch branch 06/01/2006   Carotid artery occlusion without infarction 06/01/2006   PAD (peripheral artery disease) - bilateral common iliac stents 12/15/2005   Atherosclerotic heart disease of artery bypass graft 09/01/1997    Pincus Badder 04/05/2020, 12:17 PM Merdis Delay, PT, DPT Physical Therapist - [facility]  Outpatient Physical Therapy- [place] [facility] MAIN [facility] SERVICES [address] [place], [place], [address] Phone: [phone]   Fax:  [phone]  Name: EPIC TRIBBETT MRN: [id number] Date of Birth: [birth year]

## 2020-04-09 ENCOUNTER — Ambulatory Visit: Payer: 59 | Admitting: Gastroenterology

## 2020-04-10 ENCOUNTER — Ambulatory Visit: Payer: Medicare Other

## 2020-04-10 ENCOUNTER — Other Ambulatory Visit: Payer: Self-pay

## 2020-04-10 DIAGNOSIS — G8929 Other chronic pain: Secondary | ICD-10-CM

## 2020-04-10 DIAGNOSIS — M545 Low back pain, unspecified: Secondary | ICD-10-CM

## 2020-04-10 DIAGNOSIS — R2681 Unsteadiness on feet: Secondary | ICD-10-CM

## 2020-04-10 DIAGNOSIS — M6281 Muscle weakness (generalized): Secondary | ICD-10-CM

## 2020-04-10 DIAGNOSIS — R2689 Other abnormalities of gait and mobility: Secondary | ICD-10-CM

## 2020-04-10 NOTE — Therapy (Signed)
[place] MAIN [facility] SERVICES [address] [place], [place], [address] Phone: [phone]   Fax:  [phone]  Physical Therapy Treatment  Patient Details  Name: [name] MRN: [id number] Date of Birth: 12/19/1933 Referring Provider (PT): Meeler, Sherren Kerns FNP   Encounter Date: 04/10/2020   PT End of Session - 04/10/20 0950    Visit Number 8    Number of Visits 16    Date for PT Re-Evaluation 04/24/20    Authorization Type 8/10 eval 6/29    PT Start Time 0929    PT Stop Time 1011    PT Time Calculation (min) 42 min    Equipment Utilized During Treatment Gait belt    Activity Tolerance Patient tolerated treatment well;Patient limited by fatigue    Behavior During Therapy [facility] for tasks assessed/performed           Past Medical History:  Diagnosis Date  . AAA (abdominal aortic aneurysm) ([place]) 05/2019   Relativley Rapid progression from June-Aug 2020 (4.6 - 5.4 cm): s/p EVAR (Dr. Lucky Cowboy [facility]): EVAR - 23 mm prox, 12 cm distal & x 18 cm length Gore Excluder Endoprosthesis Main body (via RFA distal to lowest Renal A); 69m x 14 cm L Contralateral Limb for L Iliac - extended with 8 mm x 6cm LifeStar stent (post-dilated with 7 mm DEB).  Additional R Iliac PTA not required.   . Arthritis   . Basal cell carcinoma of eyelid 01/03/2013   2019 - R side of Nose  . Benign neoplasm of ascending colon   . Benign neoplasm of descending colon   . Bilateral iliac artery stenosis ([place] 2007   Bilateral Iliac A stenting; extension of EVAR limbs into both Iliacs -- additional stent placed in L Iliac.  . Cancer ([place]    Skin cancer  . Colon polyps   . Complication of anesthesia    severe confusion agitation requiring hospital admission x 4 days  . Compression fracture of fourth lumbar vertebra ([place] 04/2019   - s/p Kyphoplasty; T12, L3-5 ([facility]  . COPD (chronic obstructive pulmonary disease) (HCC)    Centrilobular Emphysema  . Coronary artery  disease involving native coronary artery without angina pectoris 1998   - s/p CABG, occluded SVG-D1; patent LIMA-LAD, SVG-OM, SVG- RCA  . Dementia ([place]    memory loss - started noting late 2019  . Falls frequently    broke foot  . Fracture 2021   left foot  . GERD (gastroesophageal reflux disease)   . Hemorrhoid 02/10/2015  . History of hiatal hernia   . History of kidney stones   . HOH (hard of hearing)   . Hypertension   . Macular degeneration disease   . Osteoporosis   . PAF (paroxysmal atrial fibrillation) ([place]    on Eliquis for OAC - Rate Control   . Prostate cancer ([place]   . Prostate disorder   . Sciatic pain    Chronic  . Sleep apnea    cpap  . Temporary cerebral vascular dysfunction 02/10/2015   Had negative work-up.  July, 2012.  No medication changes, had forgot them that day, got dehydrated.   . Urinary incontinence     Past Surgical History:  Procedure Laterality Date  . ABDOMINAL AORTIC ENDOVASCULAR STENT GRAFT Bilateral 05/18/2019   Procedure: ABDOMINAL AORTIC ENDOVASCULAR STENT GRAFT;  Surgeon: DAlgernon Huxley MD;  Location: [facility] ORS;  Service: Vascular;  Laterality: Bilateral;  . ANKLE SURGERY Left  ORIF   . Arch aortogram and carotid aortogram  06/02/2006   Dr Oneida Alar did surgery  . BASAL CELL CARCINOMA EXCISION  04/2016   Dermatology  . CARDIAC CATHETERIZATION  01/30/1998    (Dr. Linard Millers): Native LAD & mRCA CTO. 90% D1. Mod Cx. SVG-D1 CTO. SVG-RCA & SVG-OM along with LIMA-LAD patent;  LV NORMAL Fxn  . CAROTID ENDARTERECTOMY Left Oct. 29, 2007   Dr. Oneida Alar:  . CATARACT EXTRACTION W/PHACO Right 03/05/2017   Procedure: CATARACT EXTRACTION PHACO AND INTRAOCULAR LENS PLACEMENT (IOC);  Surgeon: Leandrew Koyanagi, MD;  Location: [facility] ORS;  Service: Ophthalmology;  Laterality: Right;  [place] 00:58.5 AP% 10.6 CDE 6.20 Fluid Pack lot # [id number]  . CATARACT EXTRACTION W/PHACO Left 04/15/2017   Procedure: CATARACT EXTRACTION PHACO AND INTRAOCULAR LENS PLACEMENT ([place])   Left;  Surgeon: Leandrew Koyanagi, MD;  Location: [place];  Service: Ophthalmology;  Laterality: Left;  . COLONOSCOPY WITH PROPOFOL N/A 09/15/2017   Procedure: COLONOSCOPY WITH PROPOFOL;  Surgeon: Lucilla Lame, MD;  Location: [facility] ENDOSCOPY;  Service: Endoscopy;  Laterality: N/A;  . COLONOSCOPY WITH PROPOFOL N/A 10/26/2018   Procedure: COLONOSCOPY WITH PROPOFOL;  Surgeon: Lucilla Lame, MD;  Location: [facility] ENDOSCOPY;  Service: Endoscopy;  Laterality: N/A;  . CORONARY ARTERY BYPASS GRAFT  1998   LIMA-LAD, SVG-OM, SVG-RPDA, SVG-DIAG  . CPET / MET  12/2012   Mild chronotropic incompetence - read 82% of predicted; also reduced effort; peak VO2 15.7 / 75% (did not reach Max effort) -- suggested ischemic response in last 1.5 minutes of exercise. Normal pulmonary function on PFTs but poor response to  . CYSTOSCOPY W/ RETROGRADES Left 01/13/2020   Procedure: CYSTOSCOPY WITH RETROGRADE PYELOGRAM;  Surgeon: Billey Co, MD;  Location: [facility] ORS;  Service: Urology;  Laterality: Left;  . CYSTOSCOPY WITH STENT PLACEMENT Left 12/26/2019   Procedure: CYSTOSCOPY WITH STENT PLACEMENT;  Surgeon: Billey Co, MD;  Location: [facility] ORS;  Service: Urology;  Laterality: Left;  . CYSTOSCOPY/URETEROSCOPY/HOLMIUM LASER/STENT PLACEMENT Left 01/13/2020   Procedure: CYSTOSCOPY/URETEROSCOPY/HOLMIUM LASER/STENT EXCHANGE;  Surgeon: Billey Co, MD;  Location: [facility] ORS;  Service: Urology;  Laterality: Left;  . ENDOVASCULAR REPAIR/STENT GRAFT N/A 05/18/2019   Procedure: ENDOVASCULAR REPAIR/STENT GRAFT;  Surgeon: Algernon Huxley, MD;  Location: [facility] INVASIVE CV LAB;; EVAR - 23 mm prox, 12 cm distal & x 18 cm length Gore Excluder Endoprosthesis Main body (via RFA distal to lowest Renal A); 35m x 14 cm L Contralateral Limb for L Iliac - extended with 8 mm x 6cm LifeStar stent; no additional R Iliac PTA needed.   . ESOPHAGOGASTRODUODENOSCOPY (EGD) WITH PROPOFOL N/A 09/15/2017   Procedure: ESOPHAGOGASTRODUODENOSCOPY  (EGD) WITH PROPOFOL;  Surgeon: WLucilla Lame MD;  [facility];  Service: Endoscopy;  Laterality: N/A;  . FRACTURE SURGERY Right 03/19/2015   wrist  . FRACTURE SURGERY Left   . HIATAL HERNIA REPAIR    . ILIAC ARTERY STENT  12/15/2005   PTA and direct stenting rgt and lft common iliac arteries  . KYPHOPLASTY N/A 04/05/2019   Procedure: KYPHOPLASTY T12, L3, L4 L5;  Surgeon: MHessie Knows MD;  Location: [facility] ORS;  Service: Orthopedics;  Laterality: N/A;  . NM GATED MYOVIEW ([place])  05/'19; 8/'20   a) [facility]: Low Risk - no ischemia or infarct.  EF > 65%. no RWMA;; b) ([facility]: Normal wall motion.  No ischemia or infarction.  . OPEN REDUCTION INTERNAL FIXATION (ORIF) DISTAL RADIAL FRACTURE Left 01/13/2019   Procedure: OPEN REDUCTION INTERNAL FIXATION (ORIF) DISTAL  RADIAL FRACTURE - LEFT - SLEEP APNEA;  Surgeon: Hessie Knows, MD;  Location: [facility] ORS;  Service: Orthopedics;  Laterality: Left;  . ORIF ELBOW FRACTURE Right 01/01/2017   Procedure: OPEN REDUCTION INTERNAL FIXATION (ORIF) ELBOW/OLECRANON FRACTURE;  Surgeon: Hessie Knows, MD;  Location: [facility] ORS;  Service: Orthopedics;  Laterality: Right;  . ORIF WRIST FRACTURE Right 03/19/2015   Procedure: OPEN REDUCTION INTERNAL FIXATION (ORIF) WRIST FRACTURE;  Surgeon: Hessie Knows, MD;  Location: [facility] ORS;  Service: Orthopedics;  Laterality: Right;  . SHOULDER ARTHROSCOPY Right   . THORACIC AORTA - CAROTID ANGIOGRAM  October 2007   Dr. 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 TRANSTHORACIC ECHOCARDIOGRAM  February 2016   [facility]: Normal LV function. Dilated left atrium.  Domingo Dimes ECHOCARDIOGRAM  04/2019   [facility] April 20, 2019): EF 55-60 %.  Mild LVH.  Relatively normal valves.    There were no vitals filed for this visit.   Subjective Assessment - 04/10/20 0936    Subjective Patient is confused, can't remember his appointments  last week for his memory. Does remember he had a fall since seeing this therapist last. Agreeable to let this therapist call his significant other and/or son for information.    Pertinent History Patient is a pleasant 86 year old who was last seen by this PT on 12/22/19; since then has been admitted to the hospital and had other medical complications including: two surgeries for kidney stones, another one for his broken L foot. Was in a boot for 6 weeks (boot came off a week ago), and procedures for his back. Has been having hallucinations during the day and night time with increased rate frequently  Pain in back is associated with weakness, had facet joint injections. He has a history of kyphoplasty to T12, L3, L4, L5 performed in August 2020 by Dr. Rudene Christians. MRI from [facility] dated 03/28/2019 report was reviewed today. Acute compression fractures are noted at T12, L3, and L4. He has facet arthropathy at the lower lumbar levels. At L3-4 he has moderate central stenosis. PMH includes AAA, arthritis, atherosclerotic heart disease (s/p CABG), benign neoplasm of ascending/descending colon, COPD, CAD, dementia, dysrhythmia, falls, GERD, hemorrhoid, HOH, HTN, PAF, prostate cancer, osteoporosis, kidney stones, macular degeneration.    Limitations Walking;Standing;House hold activities;Other (comment);Lifting;Sitting    How long can you sit comfortably? 5 minutes    How long can you stand comfortably? needs a walker now, unsteady once standing    How long can you walk comfortably? uses a walker now; can walk in house.    Diagnostic tests MRI from [facility] dated 03/28/2019 report was reviewed today. Acute compression fractures are noted at T12, L3, and L4. He has facet arthropathy at the lower lumbar levels. At L3-4 he has moderate central stenosis.    Patient Stated Goals decrease pain and improve balance.    Currently in Pain? No/denies           Vitals at start of session  94/ 46 pulse 68  Next reading 91/45 however reading altered by patient unable to keep arm still  vitals at end of session:  95/45   Next to support surface: -SUE support modified tandem stance with opp LE on 6" step 60 seconds each LE -BUE support alternating 6" step taps 12x each LE -BUE support hip extension 15x each LE, max cueing for task orientation -BUE support cone tapping ;  frequent  Knocking cones over due to poor spatial awareness. 8x each LE,  -BUE modified squat 10x with chair behind  Static stand with RW in front of patient with horizontal head turns scanning room with chair behind and close CGA x  20  Seated:  3lb ankle weights -LAQ 15x each LE cues for upright posture -marches 12x with arms crossed -heel toe raise rockers 20x  Adduction green ball squeeze 15x   Walking with rollator walker- , focused on upright posture, navigating obstacles (PT cues needed for clearing edges of furniture)    weave between 4 cones, mod a for walker placement and feet placement within walker.   Pt educated throughout session about proper posture and technique with exercises. Improved exercise technique, movement at target joints, use of target muscles after min to mod verbal, visual, tactile cues                            PT Education - 04/10/20 0937    Education provided Yes    Education Details exercise technique, body mechanics    Person(s) Educated Patient    Methods Explanation;Demonstration;Tactile cues;Verbal cues    Comprehension Verbalized understanding;Returned demonstration;Verbal cues required;Tactile cues required            PT Short Term Goals - 02/28/20 1721      PT SHORT TERM GOAL #1   Title Patient will be independent in home exercise program to improve strength/mobility for better functional independence with ADLs.    Baseline 6/29: HEP given    Time 4    Period Weeks    Status New    Target Date 03/27/20      PT SHORT TERM GOAL  #2   Title Patient will perform 5 sit to stand transfers without UE support to increase functional mobility for carryover to community activities.    Baseline 6/29; only able to perform 2    Time 4    Period Weeks    Status New    Target Date 03/27/20             PT Long Term Goals - 02/28/20 1723      PT LONG TERM GOAL #1   Title Patient will increase FOTO score to equal to or greater than 54% to demonstrate statistically significant improvement in mobility and quality of life.    Baseline 6/29: 54%    Time 8    Period Weeks    Status New    Target Date 04/24/20      PT LONG TERM GOAL #2   Title Patient will demonstrate an improved Berg Balance Score of > 36 as to demonstrate improved balance with ADLs such as sitting/standing and transfer balance and reduced fall risk.    Baseline 6/29: 30/56    Time 8    Period Weeks    Status New    Target Date 04/24/20      PT LONG TERM GOAL #3   Title Patient will increase 10 meter walk test to >1.50ms as to improve gait speed for better community ambulation and to reduce fall risk.    Baseline 6/29: 0.59m with RW    Time 8    Period Weeks    Status New    Target Date 04/24/20      PT LONG TERM GOAL #4   Title Patient will increase BLE gross strength to 4+/5 as to improve functional strength for independent  gait, increased standing tolerance and increased ADL ability.    Baseline 6/29: see note    Time 8    Period Weeks    Status New    Target Date 04/24/20      PT LONG TERM GOAL #5   Title Patient will be able to perform household work/ chores without increase in symptoms.    Baseline 6/29: unable to perform    Time 8    Period Weeks    Status New    Target Date 04/24/20                 Plan - 04/10/20 1004    Clinical Impression Statement Patient presents with increased confusion this session requiring frequent re-direction and cueing for task performance. He is more unstable with frequent posterior LOB  throughout session requiring closer CGA and occasional min A to retain COM. Frequent rest breaks and monitoring of vitals due to declined cognition and fatigue. He would cont to benefit from skilled PT to increase strength/stability for safer ADL performance, decreased fall risk, and increased functional ability.    Personal Factors and Comorbidities Age;Comorbidity 3+;Fitness;Past/Current Experience;Sex;Social Background;Time since onset of injury/illness/exacerbation;Transportation    Comorbidities AAA, arthritis, atherosclerotic heart disease, (s/p CABG), benign neoplasm of ascending/descending colon, COPD, CAD, dementia, dysrhythmia, frequent falls, GERD, hemorrhoid, HOH, HTN, PAF, prostate cancer.    Examination-Activity Limitations Bathing;Bend;Carry;Dressing;Continence;Stairs;Squat;Reach Overhead;Locomotion Level;Lift;Stand;Transfers;Toileting;Bed Mobility    Examination-Participation Restrictions Church;Cleaning;Community Activity;Driving;Interpersonal Relationship;Laundry;Volunteer;Shop;Personal Finances;Meal Prep;Yard Work    [employer] Evolving/Moderate complexity    Rehab Potential Fair    PT Frequency 2x / week    PT Duration 8 weeks    PT Treatment/Interventions ADLs/Self Care Home Management;Aquatic Therapy;Cryotherapy;Moist Heat;Traction;Ultrasound;Therapeutic activities;Functional mobility training;Stair training;Gait training;DME Instruction;Therapeutic exercise;Balance training;Neuromuscular re-education;Patient/family education;Manual techniques;Passive range of motion;Energy conservation;Vestibular;Taping;Biofeedback;Electrical Stimulation;Iontophoresis 17m/ml Dexamethasone;Dry needling;Visual/perceptual remediation/compensation    PT Next Visit Plan core stability, LE strength, balance    PT Home Exercise Plan see above    Consulted and Agree with Plan of Care Patient           Patient will benefit from skilled therapeutic intervention in order to  improve the following deficits and impairments:  Abnormal gait, Cardiopulmonary status limiting activity, Decreased activity tolerance, Decreased balance, Decreased knowledge of precautions, Decreased endurance, Decreased coordination, Decreased cognition, Decreased knowledge of use of DME, Decreased mobility, Difficulty walking, Decreased safety awareness, Decreased strength, Impaired flexibility, Impaired perceived functional ability, Impaired sensation, Postural dysfunction, Improper body mechanics, Pain, Impaired vision/preception, Decreased range of motion  Visit Diagnosis: Unsteadiness on feet  Muscle weakness (generalized)  Other abnormalities of gait and mobility  Chronic low back pain, unspecified back pain laterality, unspecified whether sciatica present     Problem List Patient Active Problem List   Diagnosis Date Noted  . Hematuria   . Acute kidney injury superimposed on CKD ([place]   . Sepsis due to gram-negative UTI ([place] 12/26/2019  . Left nephrolithiasis 12/26/2019  . Hydronephrosis, left 12/26/2019  . Status post abdominal aortic aneurysm (AAA) repair 04/15/2019  . Candidiasis 02/22/2019  . Malignant neoplasm of descending colon ([place]   . Benign neoplasm of descending colon   . Benign neoplasm of transverse colon   . Do not intubate, cardiopulmonary resuscitation (CPR)-only code status 10/08/2018  . Prostate cancer ([place] 02/25/2018  . Prostatic cyst 01/15/2018  . 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 ([place] 09/15/2017  . Blood in stool   . Abnormal feces   . Stricture and stenosis of esophagus   . Mild cognitive impairment 08/18/2017  .  Senile purpura ([place]) 04/29/2017  . Anemia 04/29/2017  . Frequent falls 04/29/2017  . Simple chronic bronchitis ([place]) 12/17/2016  . Benign localized hyperplasia of prostate with urinary obstruction 07/15/2016  . Elevated PSA 07/15/2016  . Incomplete emptying of bladder 07/15/2016  . Urge incontinence 07/15/2016  . Hypothyroidism  04/25/2016  . Macular degeneration 04/25/2016  . Erectile dysfunction due to arterial insufficiency   . Ischemic heart disease with chronotropic incompetence   . CN (constipation) 02/10/2015  . DD (diverticular disease) 02/10/2015  . Weakness 02/10/2015  . Lichen planus 76/22/6333  . Restless leg 02/10/2015  . 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 B12 deficiency 02/10/2015  . COPD (chronic obstructive pulmonary disease) ([place]) 11/14/2014  . Post Inflammatory Lung Changes 11/14/2014  . Acute delirium 11/08/2014  . PAF (paroxysmal atrial fibrillation) (HCC) CHA2DS2-VASc = 4. AC = Eliquis   . Insomnia 12/09/2013  . OSA on CPAP   . Dyspnea on exertion - -essentially resolved with BB dose reduction & wgt loss 03/29/2013  . Overweight (BMI 25.0-29.9) -- Wgt back up 01/17/2013  . Atherosclerotic heart disease of native coronary artery without angina pectoris - s/p CABG, occluded SVG-D1; patent LIMA-LAD, SVG-OM, SVG- RCA   . Essential hypertension   . Hyperlipidemia with target LDL less than 70   . Aorto-iliac disease ([place])   . BCC (basal cell carcinoma), eyelid 01/03/2013  . Allergic rhinitis 08/17/2009  . Adaptation reaction 05/28/2009  . Acid reflux 05/28/2009  . Arthritis, degenerative 05/28/2009  . 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 Carotid artery occlusion without infarction 06/01/2006  . PAD (peripheral artery disease) - bilateral common iliac stents 12/15/2005  . Atherosclerotic heart disease of artery bypass graft 09/01/1997   Janna Arch, PT, DPT   04/10/2020, 10:12 AM  [place] MAIN [facility] SERVICES [address] [place], [place], [address] Phone: [phone]   Fax:  [phone]  Name: [name] MRN: [id number] Date of Birth: 09-Jun-1934

## 2020-04-11 ENCOUNTER — Ambulatory Visit
Admission: EM | Admit: 2020-04-11 | Discharge: 2020-04-11 | Disposition: A | Discharge status: Home / Self Care | Payer: Medicare Other | Attending: Family Medicine | Admitting: Family Medicine

## 2020-04-11 DIAGNOSIS — S51012A Laceration without foreign body of left elbow, initial encounter: Secondary | ICD-10-CM

## 2020-04-11 MED ORDER — ACETAMINOPHEN 500 MG PO TABS
1000.0000 mg | ORAL_TABLET | Freq: Once | ORAL | Status: AC
  Administered 2020-04-11: 1000 mg via ORAL

## 2020-04-11 NOTE — ED Triage Notes (Signed)
Patient in today for a skin tear on left upper arm. Patient fell last night in the bathroom and his arm caught the door jam, according to patient's son. Some dressing was applied to the site.

## 2020-04-11 NOTE — Discharge Instructions (Addendum)
Daily dressing changes.  Keep clean.  If starts to drain pus or gets very warm/hot please let us know.    Best of luck  Take care  Dr. Lacinda Axon

## 2020-04-11 NOTE — ED Provider Notes (Addendum)
[facility]    CSN: [id number] Arrival date & time: 04/11/20  8242  History   Chief Complaint Chief Complaint  Patient presents with  . Arm Pain    Skin tear left forearm   HPI   86 year old male presents with a large skin tear after suffering a fall.  Patient was getting up and went to use the restroom.  He was pulling his undergarments to the side to urinate and subsequently suffered a fall.  He states that he hit the wall.  Denies head injury.  He has a large skin tear to the left upper arm, elbow, and forearm.  Bleeding controlled at this time.  Patient reports his pain is 8/10 in severity.  No relieving factors.  No other reported symptoms.  No other complaints.  Past Medical History:  Diagnosis Date  . AAA (abdominal aortic aneurysm) ([place]) 05/2019   Relativley Rapid progression from June-Aug 2020 (4.6 - 5.4 cm): s/p EVAR (Dr. Lucky Cowboy [facility]): EVAR - 23 mm prox, 12 cm distal & x 18 cm length Gore Excluder Endoprosthesis Main body (via RFA distal to lowest Renal A); 77m x 14 cm L Contralateral Limb for L Iliac - extended with 8 mm x 6cm LifeStar stent (post-dilated with 7 mm DEB).  Additional R Iliac PTA not required.   . Arthritis   . Basal cell carcinoma of eyelid 01/03/2013   2019 - R side of Nose  . Benign neoplasm of ascending colon   . Benign neoplasm of descending colon   . Bilateral iliac artery stenosis ([place] 2007   Bilateral Iliac A stenting; extension of EVAR limbs into both Iliacs -- additional stent placed in L Iliac.  . Cancer ([place]    Skin cancer  . Colon polyps   . Complication of anesthesia    severe confusion agitation requiring hospital admission x 4 days  . Compression fracture of fourth lumbar vertebra ([place] 04/2019   - s/p Kyphoplasty; T12, L3-5 ([facility]  . COPD (chronic obstructive pulmonary disease) (HCC)    Centrilobular Emphysema  . Coronary artery disease involving native coronary artery without angina pectoris 1998   - s/p CABG, occluded  SVG-D1; patent LIMA-LAD, SVG-OM, SVG- RCA  . Dementia ([place]    memory loss - started noting late 2019  . Falls frequently    broke foot  . Fracture 2021   left foot  . GERD (gastroesophageal reflux disease)   . Hemorrhoid 02/10/2015  . History of hiatal hernia   . History of kidney stones   . HOH (hard of hearing)   . Hypertension   . Lewy body dementia ([place] 04/04/2020  . Macular degeneration disease   . Osteoporosis   . PAF (paroxysmal atrial fibrillation) ([place]    on Eliquis for OAC - Rate Control   . Prostate cancer ([place]   . Prostate disorder   . Sciatic pain    Chronic  . Sleep apnea    cpap  . Temporary cerebral vascular dysfunction 02/10/2015   Had negative work-up.  July, 2012.  No medication changes, had forgot them that day, got dehydrated.   . Urinary incontinence     Patient Active Problem List   Diagnosis Date Noted  . Hematuria   . Acute kidney injury superimposed on CKD ([place]   . Sepsis due to gram-negative UTI ([place] 12/26/2019  . Left nephrolithiasis 12/26/2019  . Hydronephrosis, left 12/26/2019  . Status post abdominal aortic aneurysm (AAA) repair 04/15/2019  . Candidiasis 02/22/2019  .  Malignant neoplasm of descending colon ([place])   . Benign neoplasm of descending colon   . Benign neoplasm of transverse colon   . Do not intubate, cardiopulmonary resuscitation (CPR)-only code status 10/08/2018  . Prostate cancer ([place]) 02/25/2018  . Prostatic cyst 01/15/2018  . 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 ([place]) 09/15/2017  . Blood in stool   . Abnormal feces   . Stricture and stenosis of esophagus   . Mild cognitive impairment 08/18/2017  . Senile purpura ([place]) 04/29/2017  . Anemia 04/29/2017  . Frequent falls 04/29/2017  . Simple chronic bronchitis ([place]) 12/17/2016  . Benign localized hyperplasia of prostate with urinary obstruction 07/15/2016  . Elevated PSA 07/15/2016  . Incomplete emptying of bladder 07/15/2016  . Urge incontinence 07/15/2016  .  Hypothyroidism 04/25/2016  . Macular degeneration 04/25/2016  . Erectile dysfunction due to arterial insufficiency   . Ischemic heart disease with chronotropic incompetence   . CN (constipation) 02/10/2015  . DD (diverticular disease) 02/10/2015  . Weakness 02/10/2015  . Lichen planus 08/67/6195  . Restless leg 02/10/2015  . 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 B12 deficiency 02/10/2015  . COPD (chronic obstructive pulmonary disease) ([place]) 11/14/2014  . Post Inflammatory Lung Changes 11/14/2014  . Acute delirium 11/08/2014  . PAF (paroxysmal atrial fibrillation) (HCC) CHA2DS2-VASc = 4. AC = Eliquis   . Insomnia 12/09/2013  . OSA on CPAP   . Dyspnea on exertion - -essentially resolved with BB dose reduction & wgt loss 03/29/2013  . Overweight (BMI 25.0-29.9) -- Wgt back up 01/17/2013  . Atherosclerotic heart disease of native coronary artery without angina pectoris - s/p CABG, occluded SVG-D1; patent LIMA-LAD, SVG-OM, SVG- RCA   . Essential hypertension   . Hyperlipidemia with target LDL less than 70   . Aorto-iliac disease ([place])   . BCC (basal cell carcinoma), eyelid 01/03/2013  . Allergic rhinitis 08/17/2009  . Adaptation reaction 05/28/2009  . Acid reflux 05/28/2009  . Arthritis, degenerative 05/28/2009  . 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 Carotid artery occlusion without infarction 06/01/2006  . PAD (peripheral artery disease) - bilateral common iliac stents 12/15/2005  . Atherosclerotic heart disease of artery bypass graft 09/01/1997    Past Surgical History:  Procedure Laterality Date  . ABDOMINAL AORTIC ENDOVASCULAR STENT GRAFT Bilateral 05/18/2019   Procedure: ABDOMINAL AORTIC ENDOVASCULAR STENT GRAFT;  Surgeon: Algernon Huxley, MD;  Location: [facility] ORS;  Service: Vascular;  Laterality: Bilateral;  . ANKLE SURGERY Left    ORIF   . Arch aortogram and carotid aortogram  06/02/2006   Dr Oneida Alar did surgery  . BASAL CELL CARCINOMA EXCISION  04/2016    Dermatology  . CARDIAC CATHETERIZATION  01/30/1998    (Dr. Linard Millers): Native LAD & mRCA CTO. 90% D1. Mod Cx. SVG-D1 CTO. SVG-RCA & SVG-OM along with LIMA-LAD patent;  LV NORMAL Fxn  . CAROTID ENDARTERECTOMY Left Oct. 29, 2007   Dr. Oneida Alar:  . CATARACT EXTRACTION W/PHACO Right 03/05/2017   Procedure: CATARACT EXTRACTION PHACO AND INTRAOCULAR LENS PLACEMENT (IOC);  Surgeon: Leandrew Koyanagi, MD;  Location: [facility] ORS;  Service: Ophthalmology;  Laterality: Right;  [place] 00:58.5 AP% 10.6 CDE 6.20 Fluid Pack lot # [id number]  . CATARACT EXTRACTION W/PHACO Left 04/15/2017   Procedure: CATARACT EXTRACTION PHACO AND INTRAOCULAR LENS PLACEMENT ([place])  Left;  Surgeon: Leandrew Koyanagi, MD;  Location: [place];  Service: Ophthalmology;  Laterality: Left;  . COLONOSCOPY WITH PROPOFOL N/A 09/15/2017   Procedure: COLONOSCOPY WITH PROPOFOL;  Surgeon: Lucilla Lame, MD;  Location:  [facility];  Service: Endoscopy;  Laterality: N/A;  . COLONOSCOPY WITH PROPOFOL N/A 10/26/2018   Procedure: COLONOSCOPY WITH PROPOFOL;  Surgeon: Lucilla Lame, MD;  Location: [facility];  Service: Endoscopy;  Laterality: N/A;  . CORONARY ARTERY BYPASS GRAFT  1998   LIMA-LAD, SVG-OM, SVG-RPDA, SVG-DIAG  . CPET / MET  12/2012   Mild chronotropic incompetence - read 82% of predicted; also reduced effort; peak VO2 15.7 / 75% (did not reach Max effort) -- suggested ischemic response in last 1.5 minutes of exercise. Normal pulmonary function on PFTs but poor response to  . CYSTOSCOPY W/ RETROGRADES Left 01/13/2020   Procedure: CYSTOSCOPY WITH RETROGRADE PYELOGRAM;  Surgeon: Billey Co, MD;  Location: [facility];  Service: Urology;  Laterality: Left;  . CYSTOSCOPY WITH STENT PLACEMENT Left 12/26/2019   Procedure: CYSTOSCOPY WITH STENT PLACEMENT;  Surgeon: Billey Co, MD;  Location: [facility];  Service: Urology;  Laterality: Left;  . CYSTOSCOPY/URETEROSCOPY/HOLMIUM LASER/STENT PLACEMENT Left 01/13/2020   Procedure:  CYSTOSCOPY/URETEROSCOPY/HOLMIUM LASER/STENT EXCHANGE;  Surgeon: Billey Co, MD;  Location: [facility];  Service: Urology;  Laterality: Left;  . ENDOVASCULAR REPAIR/STENT GRAFT N/A 05/18/2019   Procedure: ENDOVASCULAR REPAIR/STENT GRAFT;  Surgeon: Algernon Huxley, MD;  Location: [facility];; EVAR - 23 mm prox, 12 cm distal & x 18 cm length Gore Excluder Endoprosthesis Main body (via RFA distal to lowest Renal A); 72m x 14 cm L Contralateral Limb for L Iliac - extended with 8 mm x 6cm LifeStar stent; no additional R Iliac PTA needed.   . ESOPHAGOGASTRODUODENOSCOPY (EGD) WITH PROPOFOL N/A 09/15/2017   Procedure: ESOPHAGOGASTRODUODENOSCOPY (EGD) WITH PROPOFOL;  Surgeon: WLucilla Lame MD;  [facility];  Service: Endoscopy;  Laterality: N/A;  . FRACTURE SURGERY Right 03/19/2015   wrist  . FRACTURE SURGERY Left   . HIATAL HERNIA REPAIR    . ILIAC ARTERY STENT  12/15/2005   PTA and direct stenting rgt and lft common iliac arteries  . KYPHOPLASTY N/A 04/05/2019   Procedure: KYPHOPLASTY T12, L3, L4 L5;  Surgeon: MHessie Knows MD;  Location: [facility];  Service: Orthopedics;  Laterality: N/A;  . NM GATED MYOVIEW ([place])  05/'19; 8/'20   a) [facility]: Low Risk - no ischemia or infarct.  EF > 65%. no RWMA;; b) ([facility]: Normal wall motion.  No ischemia or infarction.  . OPEN REDUCTION INTERNAL FIXATION (ORIF) DISTAL RADIAL FRACTURE Left 01/13/2019   Procedure: OPEN REDUCTION INTERNAL FIXATION (ORIF) DISTAL  RADIAL FRACTURE - LEFT - SLEEP APNEA;  Surgeon: MHessie Knows MD;  Location: [facility];  Service: Orthopedics;  Laterality: Left;  . ORIF ELBOW FRACTURE Right 01/01/2017   Procedure: OPEN REDUCTION INTERNAL FIXATION (ORIF) ELBOW/OLECRANON FRACTURE;  Surgeon: MHessie Knows MD;  Location: [facility];  Service: Orthopedics;  Laterality: Right;  . ORIF WRIST FRACTURE Right 03/19/2015   Procedure: OPEN REDUCTION INTERNAL FIXATION (ORIF) WRIST FRACTURE;  Surgeon: MHessie Knows MD;  Location:  [facility];  Service: Orthopedics;  Laterality: Right;  . SHOULDER ARTHROSCOPY Right   . THORACIC AORTA - CAROTID ANGIOGRAM  October 2007   Dr. 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 TRANSTHORACIC ECHOCARDIOGRAM  February 2016   [facility]: Normal LV function. Dilated left atrium.  .Domingo DimesECHOCARDIOGRAM  04/2019   [facility] EF 55-60 %.  Mild LVH.  Relatively normal valves.       Home Medications    Prior to  Admission medications   Medication Sig Start Date End Date Taking? Authorizing Provider  acetaminophen (TYLENOL) 500 MG tablet Take 500-1,000 mg by mouth every 6 (six) hours as needed for mild pain or moderate pain.    Yes [provider]  Ascorbic Acid (VITAMIN C) 1000 MG tablet Take 1,000 mg by mouth daily.   Yes [provider]  Cyanocobalamin (VITAMIN B-12) 500 MCG SUBL Place 500 mcg under the tongue daily.    Yes [provider]  ELIQUIS 5 MG TABS tablet TAKE 1 TABLET BY MOUTH TWICE DAILY 01/14/19  Yes Leonie Man, MD  ezetimibe (ZETIA) 10 MG tablet Take 1 tablet (10 mg total) by mouth daily. 04/19/19  Yes [name], MD  finasteride (PROSCAR) 5 MG tablet TAKE ONE TABLET BY MOUTH EVERY DAY 01/02/20  Yes Stoioff, Ronda Fairly, MD  fluticasone (FLONASE) 50 MCG/ACT nasal spray PLACE TWO SPRAYS IN BOTH NOSRTILS EVERY DAY AS NEEDED FOR ALLERGIES 10/28/19  Yes [name], MD  Magnesium Oxide 500 MG TABS Take 500 mg by mouth every evening.   Yes [provider]  metoprolol tartrate (LOPRESSOR) 25 MG tablet Take 1 tablet (25 mg total) by mouth 2 (two) times daily. *OFFICE VISIT NEEDED FOR FURTHER REFILLS* 05/06/19  Yes Leonie Man, MD  Multiple Vitamins-Minerals (PRESERVISION AREDS 2 PO) Take 1 tablet by mouth 2 (two) times daily.    Yes [provider]  MYRBETRIQ 50 MG TB24 tablet TAKE ONE TABLET BY MOUTH EVERY DAY  01/02/20  Yes Stoioff, Ronda Fairly, MD  pantoprazole (PROTONIX) 40 MG tablet TAKE ONE TABLET BY MOUTH EVERY DAY 11/02/19  Yes [name], MD  QUEtiapine (SEROQUEL) 25 MG tablet Take by mouth.   Yes [provider]  rosuvastatin (CRESTOR) 20 MG tablet TAKE ONE TABLET BY MOUTH EVERY DAY 10/01/19  Yes [name], MD  tamsulosin (FLOMAX) 0.4 MG CAPS capsule Take 1 capsule (0.4 mg total) by mouth every evening. 06/07/19  Yes Stoioff, Ronda Fairly, MD  umeclidinium-vilanterol (ANORO ELLIPTA) 62.5-25 MCG/INH AEPB Inhale 1 puff into the lungs daily.  10/25/19  Yes [provider]  lisinopril (ZESTRIL) 2.5 MG tablet Take 1 tablet (2.5 mg total) by mouth daily. 01/02/20 04/01/20  Leonie Man, MD  nitroGLYCERIN (NITROSTAT) 0.4 MG SL tablet Place 1 tablet (0.4 mg total) under the tongue every 5 (five) minutes as needed for chest pain. 01/12/17   [name], MD    Family History Family History  Problem Relation Age of Onset  . Ulcers Mother        Peptic  . Dementia Mother   . Alcohol abuse Father     Social History Social History   Tobacco Use  . Smoking status: Former Smoker    Types: Cigarettes    Quit date: 08/04/1997    Years since quitting: 22.7  . Smokeless tobacco: Never Used  Vaping Use  . Vaping Use: Never used  Substance Use Topics  . Alcohol use: No  . Drug use: No     Allergies   Clonazepam, Codeine, Ropinirole, Hydrocodone, Niacin and related, Oxycodone, and Tramadol   Review of Systems Review of Systems  Constitutional: Negative.   Skin: Positive for wound.   Physical Exam Triage Vital Signs ED Triage Vitals  Enc Vitals Group     BP 04/11/20 0934 127/78     Pulse Rate 04/11/20 0934 90     Resp 04/11/20 0934 18     Temp 04/11/20  0934 98.3 F (36.8 C)     Temp Source 04/11/20 0934 Oral     SpO2 04/11/20 0934 97 %     Weight 04/11/20 0938 186 lb (84.4 kg)     Height 04/11/20 0938 _0  (1.778 m)     Head Circumference --      Peak Flow --       Pain Score 04/11/20 0937 8     Pain Loc --      Pain Edu? --      Excl. in [place]? --    Updated Vital Signs BP 127/78 (BP Location: Right Arm)   Pulse 90   Temp 98.3 F (36.8 C) (Oral)   Resp 18   Ht _1  (1.778 m)   Wt 84.4 kg   SpO2 97%   BMI 26.69 kg/m   Visual Acuity Right Eye Distance:   Left Eye Distance:   Bilateral Distance:    Right Eye Near:   Left Eye Near:    Bilateral Near:     Physical Exam Vitals and nursing note reviewed.  Constitutional:      General: He is not in acute distress.    Appearance: Normal appearance. He is not ill-appearing.  HENT:     Head: Normocephalic and atraumatic.  Eyes:     General:        Right eye: No discharge.        Left eye: No discharge.     Conjunctiva/sclera: Conjunctivae normal.  Pulmonary:     Effort: Pulmonary effort is normal. No respiratory distress.  Skin:    Comments: Left arm -large skin tear noted to the distal left upper arm, elbow, and forearm.  Neurological:     Mental Status: He is alert.  Psychiatric:        Mood and Affect: Mood normal.        Behavior: Behavior normal.     UC Treatments / Results  Labs (all labs ordered are listed, but only abnormal results are displayed) Labs Reviewed - No data to display  EKG   Radiology No results found.  Procedures Procedures (including critical care time)  Medications Ordered in UC Medications  acetaminophen (TYLENOL) tablet 1,000 mg (1,000 mg Oral Given 04/11/20 1045)    Initial Impression / Assessment and Plan / UC Course  I have reviewed the triage vital signs and the nursing notes.  Pertinent labs & imaging results that were available during my care of the patient were reviewed by me and considered in my medical decision making (see chart for details).    86 year old male presents with a skin tear.  Wound was dressed.  Advised to keep clean.  Referral to wound care given.  Tylenol given today for pain.  Final Clinical Impressions(s)  / UC Diagnoses   Final diagnoses:  Skin tear of left elbow without complication, initial encounter     Discharge Instructions     Daily dressing changes.  Keep clean.  If starts to drain pus or gets very warm/hot please let us know.    Best of luck  Take care  Dr. Lacinda Axon    ED Prescriptions    None     PDMP not reviewed this encounter.     Coral Spikes, [place] 04/11/20 1152

## 2020-04-12 ENCOUNTER — Other Ambulatory Visit: Payer: Self-pay

## 2020-04-12 ENCOUNTER — Ambulatory Visit: Payer: Medicare Other

## 2020-04-12 DIAGNOSIS — R2689 Other abnormalities of gait and mobility: Secondary | ICD-10-CM

## 2020-04-12 DIAGNOSIS — M545 Low back pain, unspecified: Secondary | ICD-10-CM

## 2020-04-12 DIAGNOSIS — G8929 Other chronic pain: Secondary | ICD-10-CM

## 2020-04-12 DIAGNOSIS — M6281 Muscle weakness (generalized): Secondary | ICD-10-CM

## 2020-04-12 DIAGNOSIS — R2681 Unsteadiness on feet: Secondary | ICD-10-CM

## 2020-04-12 NOTE — Therapy (Signed)
[place] MAIN [facility] SERVICES [address] [place], [place], [address] Phone: [phone]   Fax:  [phone]  Physical Therapy Treatment  Patient Details  Name: [name] MRN: [id number] Date of Birth: 03-Apr-1934 Referring Provider (PT): Meeler, Sherren Kerns FNP   Encounter Date: 04/12/2020   PT End of Session - 04/12/20 0936    Visit Number 9    Number of Visits 16    Date for PT Re-Evaluation 04/24/20    Authorization Type 9/10 eval 6/29    PT Start Time 0929    PT Stop Time 1013    PT Time Calculation (min) 44 min    Equipment Utilized During Treatment Gait belt    Activity Tolerance Patient tolerated treatment well;Patient limited by fatigue    Behavior During Therapy [facility] for tasks assessed/performed           Past Medical History:  Diagnosis Date  . AAA (abdominal aortic aneurysm) ([place]) 05/2019   Relativley Rapid progression from June-Aug 2020 (4.6 - 5.4 cm): s/p EVAR (Dr. Lucky Cowboy [facility]): EVAR - 23 mm prox, 12 cm distal & x 18 cm length Gore Excluder Endoprosthesis Main body (via RFA distal to lowest Renal A); 72m x 14 cm L Contralateral Limb for L Iliac - extended with 8 mm x 6cm LifeStar stent (post-dilated with 7 mm DEB).  Additional R Iliac PTA not required.   . Arthritis   . Basal cell carcinoma of eyelid 01/03/2013   2019 - R side of Nose  . Benign neoplasm of ascending colon   . Benign neoplasm of descending colon   . Bilateral iliac artery stenosis ([place] 2007   Bilateral Iliac A stenting; extension of EVAR limbs into both Iliacs -- additional stent placed in L Iliac.  . Cancer ([place]    Skin cancer  . Colon polyps   . Complication of anesthesia    severe confusion agitation requiring hospital admission x 4 days  . Compression fracture of fourth lumbar vertebra ([place] 04/2019   - s/p Kyphoplasty; T12, L3-5 ([facility]  . COPD (chronic obstructive pulmonary disease) (HCC)    Centrilobular Emphysema  . Coronary artery  disease involving native coronary artery without angina pectoris 1998   - s/p CABG, occluded SVG-D1; patent LIMA-LAD, SVG-OM, SVG- RCA  . Dementia ([place]    memory loss - started noting late 2019  . Falls frequently    broke foot  . Fracture 2021   left foot  . GERD (gastroesophageal reflux disease)   . Hemorrhoid 02/10/2015  . History of hiatal hernia   . History of kidney stones   . HOH (hard of hearing)   . Hypertension   . Lewy body dementia ([place] 04/04/2020  . Macular degeneration disease   . Osteoporosis   . PAF (paroxysmal atrial fibrillation) ([place]    on Eliquis for OAC - Rate Control   . Prostate cancer ([place]   . Prostate disorder   . Sciatic pain    Chronic  . Sleep apnea    cpap  . Temporary cerebral vascular dysfunction 02/10/2015   Had negative work-up.  July, 2012.  No medication changes, had forgot them that day, got dehydrated.   . Urinary incontinence     Past Surgical History:  Procedure Laterality Date  . ABDOMINAL AORTIC ENDOVASCULAR STENT GRAFT Bilateral 05/18/2019   Procedure: ABDOMINAL AORTIC ENDOVASCULAR STENT GRAFT;  Surgeon: DAlgernon Huxley MD;  Location: [facility] ORS;  Service: Vascular;  Laterality: Bilateral;  .  ANKLE SURGERY Left    ORIF   . Arch aortogram and carotid aortogram  06/02/2006   Dr Oneida Alar did surgery  . BASAL CELL CARCINOMA EXCISION  04/2016   Dermatology  . CARDIAC CATHETERIZATION  01/30/1998    (Dr. Linard Millers): Native LAD & mRCA CTO. 90% D1. Mod Cx. SVG-D1 CTO. SVG-RCA & SVG-OM along with LIMA-LAD patent;  LV NORMAL Fxn  . CAROTID ENDARTERECTOMY Left Oct. 29, 2007   Dr. Oneida Alar:  . CATARACT EXTRACTION W/PHACO Right 03/05/2017   Procedure: CATARACT EXTRACTION PHACO AND INTRAOCULAR LENS PLACEMENT (IOC);  Surgeon: Leandrew Koyanagi, MD;  Location: [facility] ORS;  Service: Ophthalmology;  Laterality: Right;  [place] 00:58.5 AP% 10.6 CDE 6.20 Fluid Pack lot # [id number]  . CATARACT EXTRACTION W/PHACO Left 04/15/2017   Procedure: CATARACT EXTRACTION  PHACO AND INTRAOCULAR LENS PLACEMENT ([place])  Left;  Surgeon: Leandrew Koyanagi, MD;  Location: [place];  Service: Ophthalmology;  Laterality: Left;  . COLONOSCOPY WITH PROPOFOL N/A 09/15/2017   Procedure: COLONOSCOPY WITH PROPOFOL;  Surgeon: Lucilla Lame, MD;  Location: [facility] ENDOSCOPY;  Service: Endoscopy;  Laterality: N/A;  . COLONOSCOPY WITH PROPOFOL N/A 10/26/2018   Procedure: COLONOSCOPY WITH PROPOFOL;  Surgeon: Lucilla Lame, MD;  Location: [facility] ENDOSCOPY;  Service: Endoscopy;  Laterality: N/A;  . CORONARY ARTERY BYPASS GRAFT  1998   LIMA-LAD, SVG-OM, SVG-RPDA, SVG-DIAG  . CPET / MET  12/2012   Mild chronotropic incompetence - read 82% of predicted; also reduced effort; peak VO2 15.7 / 75% (did not reach Max effort) -- suggested ischemic response in last 1.5 minutes of exercise. Normal pulmonary function on PFTs but poor response to  . CYSTOSCOPY W/ RETROGRADES Left 01/13/2020   Procedure: CYSTOSCOPY WITH RETROGRADE PYELOGRAM;  Surgeon: Billey Co, MD;  Location: [facility] ORS;  Service: Urology;  Laterality: Left;  . CYSTOSCOPY WITH STENT PLACEMENT Left 12/26/2019   Procedure: CYSTOSCOPY WITH STENT PLACEMENT;  Surgeon: Billey Co, MD;  Location: [facility] ORS;  Service: Urology;  Laterality: Left;  . CYSTOSCOPY/URETEROSCOPY/HOLMIUM LASER/STENT PLACEMENT Left 01/13/2020   Procedure: CYSTOSCOPY/URETEROSCOPY/HOLMIUM LASER/STENT EXCHANGE;  Surgeon: Billey Co, MD;  Location: [facility] ORS;  Service: Urology;  Laterality: Left;  . ENDOVASCULAR REPAIR/STENT GRAFT N/A 05/18/2019   Procedure: ENDOVASCULAR REPAIR/STENT GRAFT;  Surgeon: Algernon Huxley, MD;  Location: [facility] INVASIVE CV LAB;; EVAR - 23 mm prox, 12 cm distal & x 18 cm length Gore Excluder Endoprosthesis Main body (via RFA distal to lowest Renal A); 49m x 14 cm L Contralateral Limb for L Iliac - extended with 8 mm x 6cm LifeStar stent; no additional R Iliac PTA needed.   . ESOPHAGOGASTRODUODENOSCOPY (EGD) WITH PROPOFOL N/A 09/15/2017    Procedure: ESOPHAGOGASTRODUODENOSCOPY (EGD) WITH PROPOFOL;  Surgeon: WLucilla Lame MD;  [facility];  Service: Endoscopy;  Laterality: N/A;  . FRACTURE SURGERY Right 03/19/2015   wrist  . FRACTURE SURGERY Left   . HIATAL HERNIA REPAIR    . ILIAC ARTERY STENT  12/15/2005   PTA and direct stenting rgt and lft common iliac arteries  . KYPHOPLASTY N/A 04/05/2019   Procedure: KYPHOPLASTY T12, L3, L4 L5;  Surgeon: MHessie Knows MD;  Location: [facility] ORS;  Service: Orthopedics;  Laterality: N/A;  . NM GATED MYOVIEW ([place])  05/'19; 8/'20   a) [facility]: Low Risk - no ischemia or infarct.  EF > 65%. no RWMA;; b) ([facility]: Normal wall motion.  No ischemia or infarction.  . OPEN REDUCTION INTERNAL FIXATION (ORIF) DISTAL RADIAL FRACTURE Left 01/13/2019   Procedure:  OPEN REDUCTION INTERNAL FIXATION (ORIF) DISTAL  RADIAL FRACTURE - LEFT - SLEEP APNEA;  Surgeon: Hessie Knows, MD;  Location: [facility] ORS;  Service: Orthopedics;  Laterality: Left;  . ORIF ELBOW FRACTURE Right 01/01/2017   Procedure: OPEN REDUCTION INTERNAL FIXATION (ORIF) ELBOW/OLECRANON FRACTURE;  Surgeon: Hessie Knows, MD;  Location: [facility] ORS;  Service: Orthopedics;  Laterality: Right;  . ORIF WRIST FRACTURE Right 03/19/2015   Procedure: OPEN REDUCTION INTERNAL FIXATION (ORIF) WRIST FRACTURE;  Surgeon: Hessie Knows, MD;  Location: [facility] ORS;  Service: Orthopedics;  Laterality: Right;  . SHOULDER ARTHROSCOPY Right   . THORACIC AORTA - CAROTID ANGIOGRAM  October 2007   Dr. 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 TRANSTHORACIC ECHOCARDIOGRAM  February 2016   [facility]: Normal LV function. Dilated left atrium.  Domingo Dimes ECHOCARDIOGRAM  04/2019   [facility] April 20, 2019): EF 55-60 %.  Mild LVH.  Relatively normal valves.    There were no vitals filed for this visit.   Subjective Assessment - 04/12/20 0934    Subjective Went to ED  yesterday for a fall, patient does not remember this, had to have aide assist in information gathering. Has wounds on L arm and R hand.  Is having increased difficulty with memory and safety awareness.    Pertinent History Patient is a pleasant 86 year old who was last seen by this PT on 12/22/19; since then has been admitted to the hospital and had other medical complications including: two surgeries for kidney stones, another one for his broken L foot. Was in a boot for 6 weeks (boot came off a week ago), and procedures for his back. Has been having hallucinations during the day and night time with increased rate frequently  Pain in back is associated with weakness, had facet joint injections. He has a history of kyphoplasty to T12, L3, L4, L5 performed in August 2020 by Dr. Rudene Christians. MRI from [facility] dated 03/28/2019 report was reviewed today. Acute compression fractures are noted at T12, L3, and L4. He has facet arthropathy at the lower lumbar levels. At L3-4 he has moderate central stenosis. PMH includes AAA, arthritis, atherosclerotic heart disease (s/p CABG), benign neoplasm of ascending/descending colon, COPD, CAD, dementia, dysrhythmia, falls, GERD, hemorrhoid, HOH, HTN, PAF, prostate cancer, osteoporosis, kidney stones, macular degeneration.    Limitations Walking;Standing;House hold activities;Other (comment);Lifting;Sitting    How long can you sit comfortably? 5 minutes    How long can you stand comfortably? needs a walker now, unsteady once standing    How long can you walk comfortably? uses a walker now; can walk in house.    Diagnostic tests MRI from [facility] dated 03/28/2019 report was reviewed today. Acute compression fractures are noted at T12, L3, and L4. He has facet arthropathy at the lower lumbar levels. At L3-4 he has moderate central stenosis.    Patient Stated Goals decrease pain and improve balance.    Currently in Pain? No/denies                 Went to ED yesterday for a fall, patient does not remember this, had to have aide assist in information gathering. Has wounds on L arm and R hand.  Is having increased difficulty with memory and safety awareness.   Vitals at start of session 129/65  Nustep Lvl 3 RPM> 60 3 minutes for cardiovascular challenge/musculoskeletal strengthening.   Next to support surface:  airex pad: -static stand 40 seconds no UE support x2 trials  -static stand head turns 60 seconds   Ambulate with rollator in hallways: Horizontal head turns for stabilization 160 ft ; three near LOB  Modified STS squat 10x with BUE support   Standing with # 3 ankle weight: CGA for stability  -Hip extension with bilateral upper extremity support, cueing for neutral hip alignment, upright posture for optimal muscle recruitment, and sequencing, 10x each LE,  -Hip abduction with bilateral upper extremity support, cueing for neutral foot alignment for correct muscle activation, 15x each LE -Hip flexion with bilateral upper extremity support, cueing for body mechanics, speed of muscle recruitment for optimal strengthening and stabilization 10x each LE    Seated with # 3lb ankle weights  -Seated marches with upright posture, back away from back of chair for abdominal/trunk activation/stabilization, 10x each LE -Seated LAQ with 3 second holds, 15x each LE, cueing for muscle activation and sequencing for neutral alignment -Seated IR/ER with cueing for stabilizing knee placement with lateral foot movement for optimal muscle recruitment, 10x each    Adduction green ball squeeze 15x   Seated 6" step toe taps 10x each LE,    Walking with rollator walker- , focused on upright posture, navigating obstacles (PT cues needed for clearing edges of furniture)  Pt educated throughout session about proper posture and technique with exercises. Improved exercise technique, movement at target joints, use of target muscles after min  to mod verbal, visual, tactile cues                      PT Education - 04/12/20 0935    Education provided Yes    Education Details exercise technique, body mechanics    Person(s) Educated Patient    Methods Explanation;Demonstration;Tactile cues;Verbal cues    Comprehension Verbalized understanding;Returned demonstration;Verbal cues required;Tactile cues required            PT Short Term Goals - 02/28/20 1721      PT SHORT TERM GOAL #1   Title Patient will be independent in home exercise program to improve strength/mobility for better functional independence with ADLs.    Baseline 6/29: HEP given    Time 4    Period Weeks    Status New    Target Date 03/27/20      PT SHORT TERM GOAL #2   Title Patient will perform 5 sit to stand transfers without UE support to increase functional mobility for carryover to community activities.    Baseline 6/29; only able to perform 2    Time 4    Period Weeks    Status New    Target Date 03/27/20             PT Long Term Goals - 02/28/20 1723      PT LONG TERM GOAL #1   Title Patient will increase FOTO score to equal to or greater than 54% to demonstrate statistically significant improvement in mobility and quality of life.    Baseline 6/29: 54%    Time 8    Period Weeks    Status New    Target Date 04/24/20      PT LONG TERM GOAL #2   Title Patient will demonstrate an improved Berg Balance Score of > 36 as to demonstrate improved balance with ADLs such as sitting/standing and transfer balance and reduced fall risk.    Baseline 6/29: 30/56    Time 8    Period  Weeks    Status New    Target Date 04/24/20      PT LONG TERM GOAL #3   Title Patient will increase 10 meter walk test to >1.66ms as to improve gait speed for better community ambulation and to reduce fall risk.    Baseline 6/29: 0.516m with RW    Time 8    Period Weeks    Status New    Target Date 04/24/20      PT LONG TERM GOAL #4   Title  Patient will increase BLE gross strength to 4+/5 as to improve functional strength for independent gait, increased standing tolerance and increased ADL ability.    Baseline 6/29: see note    Time 8    Period Weeks    Status New    Target Date 04/24/20      PT LONG TERM GOAL #5   Title Patient will be able to perform household work/ chores without increase in symptoms.    Baseline 6/29: unable to perform    Time 8    Period Weeks    Status New    Target Date 04/24/20                 Plan - 04/12/20 1009    Clinical Impression Statement Patient is having increased challenge with spatial awareness with shortened arc of motion with repeated motions. Patient is demonstrating decreased cognition, memory, and ability to follow commands. He is more unstable with frequent posterior LOB throughout session requiring closer CGA and occasional min A to retain COM. Frequent rest breaks and monitoring of vitals due to declined cognition and fatigue. He would cont to benefit from skilled PT to increase strength/stability for safer ADL performance, decreased fall risk, and increased functional ability.    Personal Factors and Comorbidities Age;Comorbidity 3+;Fitness;Past/Current Experience;Sex;Social Background;Time since onset of injury/illness/exacerbation;Transportation    Comorbidities AAA, arthritis, atherosclerotic heart disease, (s/p CABG), benign neoplasm of ascending/descending colon, COPD, CAD, dementia, dysrhythmia, frequent falls, GERD, hemorrhoid, HOH, HTN, PAF, prostate cancer.    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    Rehab  Potential Fair    PT Frequency 2x / week    PT Duration 8 weeks    PT Treatment/Interventions ADLs/Self Care Home Management;Aquatic Therapy;Cryotherapy;Moist Heat;Traction;Ultrasound;Therapeutic activities;Functional mobility training;Stair training;Gait training;DME Instruction;Therapeutic exercise;Balance training;Neuromuscular re-education;Patient/family education;Manual techniques;Passive range of motion;Energy conservation;Vestibular;Taping;Biofeedback;Electrical Stimulation;Iontophoresis '4mg'$ /ml Dexamethasone;Dry needling;Visual/perceptual remediation/compensation    PT Next Visit Plan core stability, LE strength, balance    PT Home Exercise Plan see above    Consulted and Agree with Plan of Care Patient           Patient will benefit from skilled therapeutic intervention in order to improve the following deficits and impairments:  Abnormal gait, Cardiopulmonary status limiting activity, Decreased activity tolerance, Decreased balance, Decreased knowledge of precautions, Decreased endurance, Decreased coordination, Decreased cognition, Decreased knowledge of use of DME, Decreased mobility, Difficulty walking, Decreased safety awareness, Decreased strength, Impaired flexibility, Impaired perceived functional ability, Impaired sensation, Postural dysfunction, Improper body mechanics, Pain, Impaired vision/preception, Decreased range of motion  Visit Diagnosis: Unsteadiness on feet  Muscle weakness (generalized)  Other abnormalities of gait and mobility  Chronic low back pain, unspecified back pain laterality, unspecified whether sciatica present     Problem List Patient Active Problem List   Diagnosis Date Noted  . Hematuria   . Acute kidney injury superimposed  on CKD ([place])   . Sepsis due to gram-negative UTI ([place]) 12/26/2019  . Left nephrolithiasis 12/26/2019  . Hydronephrosis, left 12/26/2019  . Status post abdominal aortic aneurysm (AAA) repair 04/15/2019  . Candidiasis  02/22/2019  . Malignant neoplasm of descending colon ([place])   . Benign neoplasm of descending colon   . Benign neoplasm of transverse colon   . Do not intubate, cardiopulmonary resuscitation (CPR)-only code status 10/08/2018  . Prostate cancer ([place]) 02/25/2018  . Prostatic cyst 01/15/2018  . 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 ([place]) 09/15/2017  . Blood in stool   . Abnormal feces   . Stricture and stenosis of esophagus   . Mild cognitive impairment 08/18/2017  . Senile purpura ([place]) 04/29/2017  . Anemia 04/29/2017  . Frequent falls 04/29/2017  . Simple chronic bronchitis ([place]) 12/17/2016  . Benign localized hyperplasia of prostate with urinary obstruction 07/15/2016  . Elevated PSA 07/15/2016  . Incomplete emptying of bladder 07/15/2016  . Urge incontinence 07/15/2016  . Hypothyroidism 04/25/2016  . Macular degeneration 04/25/2016  . Erectile dysfunction due to arterial insufficiency   . Ischemic heart disease with chronotropic incompetence   . CN (constipation) 02/10/2015  . DD (diverticular disease) 02/10/2015  . Weakness 02/10/2015  . Lichen planus 84/13/2440  . Restless leg 02/10/2015  . 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 B12 deficiency 02/10/2015  . COPD (chronic obstructive pulmonary disease) ([place]) 11/14/2014  . Post Inflammatory Lung Changes 11/14/2014  . Acute delirium 11/08/2014  . PAF (paroxysmal atrial fibrillation) (HCC) CHA2DS2-VASc = 4. AC = Eliquis   . Insomnia 12/09/2013  . OSA on CPAP   . Dyspnea on exertion - -essentially resolved with BB dose reduction & wgt loss 03/29/2013  . Overweight (BMI 25.0-29.9) -- Wgt back up 01/17/2013  . Atherosclerotic heart disease of native coronary artery without angina pectoris - s/p CABG, occluded SVG-D1; patent LIMA-LAD, SVG-OM, SVG- RCA   . Essential hypertension   . Hyperlipidemia with target LDL less than 70   . Aorto-iliac disease ([place])   . BCC (basal cell carcinoma), eyelid 01/03/2013  . Allergic rhinitis  08/17/2009  . Adaptation reaction 05/28/2009  . Acid reflux 05/28/2009  . Arthritis, degenerative 05/28/2009  . 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 Carotid artery occlusion without infarction 06/01/2006  . PAD (peripheral artery disease) - bilateral common iliac stents 12/15/2005  . Atherosclerotic heart disease of artery bypass graft 09/01/1997   Janna Arch, PT, DPT   04/12/2020, 10:14 AM  [place] MAIN [facility] SERVICES [address] [place], [place], [address] Phone: [phone]   Fax:  [phone]  Name: LESSIE MANIGO MRN: [id number] Date of Birth: December 05, 1933

## 2020-04-15 ENCOUNTER — Ambulatory Visit
Admission: EM | Admit: 2020-04-15 | Discharge: 2020-04-15 | Disposition: A | Discharge status: Home / Self Care | Payer: Medicare Other | Attending: Internal Medicine | Admitting: Internal Medicine

## 2020-04-15 ENCOUNTER — Other Ambulatory Visit: Payer: Self-pay

## 2020-04-15 DIAGNOSIS — T148XXA Other injury of unspecified body region, initial encounter: Secondary | ICD-10-CM | POA: Diagnosis not present

## 2020-04-15 DIAGNOSIS — L089 Local infection of the skin and subcutaneous tissue, unspecified: Secondary | ICD-10-CM

## 2020-04-15 MED ORDER — DOXYCYCLINE HYCLATE 100 MG PO CAPS
100.0000 mg | ORAL_CAPSULE | Freq: Two times a day (BID) | ORAL | 0 refills | Status: AC
Start: 2020-04-15 — End: 2020-04-20

## 2020-04-15 NOTE — ED Triage Notes (Addendum)
Patient in for recheck of skin tear on Left arm. Patient's SO states that his arm has swelling and streaks around his arm.   Wound cleaned and dried. Wound dressing applied with bacitracin, non-stick telfa, burn sock applied, wrapped with kerlix, and coban for protection. Patient tolerated well.

## 2020-04-15 NOTE — Discharge Instructions (Addendum)
Please use antibiotic ointment with each dressing change Please use nonadherent dressing Avoid tight bandaging Take antibiotics as prescribed If symptoms worsen return to the urgent care to be reevaluated.

## 2020-04-15 NOTE — ED Provider Notes (Signed)
[facility]    CSN: [id number] Arrival date & time: 04/15/20  7412      History   Chief Complaint Chief Complaint  Patient presents with  . Laceration    Skin tear, Left arm    HPI [name] is a 86 y.o. male returns to the urgent care with swelling of the left upper extremity with associated redness around the wound.  Patient was seen here earlier this week for traumatic skin tear following a fall.  Wound dressing with dressing changes was recommended.  Patient's wife has been doing the dressing changes at home using Vaseline adherent gauze and some bandage.  The bandage this morning appears very tight on the left upper extremity.  Left upper extremity is swelling distal to the bandage.  There is also some erythema surrounding the wound.  No purulent discharge.  No fever, chills, change in behavior or confusion.   HPI  Past Medical History:  Diagnosis Date  . AAA (abdominal aortic aneurysm) ([place]) 05/2019   Relativley Rapid progression from June-Aug 2020 (4.6 - 5.4 cm): s/p EVAR (Dr. Lucky Cowboy [facility]): EVAR - 23 mm prox, 12 cm distal & x 18 cm length Gore Excluder Endoprosthesis Main body (via RFA distal to lowest Renal A); 69m x 14 cm L Contralateral Limb for L Iliac - extended with 8 mm x 6cm LifeStar stent (post-dilated with 7 mm DEB).  Additional R Iliac PTA not required.   . Arthritis   . Basal cell carcinoma of eyelid 01/03/2013   2019 - R side of Nose  . Benign neoplasm of ascending colon   . Benign neoplasm of descending colon   . Bilateral iliac artery stenosis ([place]   Bilateral Iliac A stenting; extension of EVAR limbs into both Iliacs -- additional stent placed in L Iliac.  . Cancer ([place]    Skin cancer  . Colon polyps   . Complication of anesthesia    severe confusion agitation requiring hospital admission x 4 days  . Compression fracture of fourth lumbar vertebra ([place] 04/2019   - s/p Kyphoplasty; T12, L3-5 ([facility]  . COPD (chronic obstructive  pulmonary disease) (HCC)    Centrilobular Emphysema  . Coronary artery disease involving native coronary artery without angina pectoris 1998   - s/p CABG, occluded SVG-D1; patent LIMA-LAD, SVG-OM, SVG- RCA  . Dementia ([place]    memory loss - started noting late 2019  . Falls frequently    broke foot  . Fracture 2021   left foot  . GERD (gastroesophageal reflux disease)   . Hemorrhoid 02/10/2015  . History of hiatal hernia   . History of kidney stones   . HOH (hard of hearing)   . Hypertension   . Lewy body dementia ([place] 04/04/2020  . Macular degeneration disease   . Osteoporosis   . PAF (paroxysmal atrial fibrillation) ([place]    on Eliquis for OAC - Rate Control   . Prostate cancer ([place]   . Prostate disorder   . Sciatic pain    Chronic  . Sleep apnea    cpap  . Temporary cerebral vascular dysfunction 02/10/2015   Had negative work-up.  July, 2012.  No medication changes, had forgot them that day, got dehydrated.   . Urinary incontinence     Patient Active Problem List   Diagnosis Date Noted  . Hematuria   . Acute kidney injury superimposed on CKD ([place]   . Sepsis due to gram-negative UTI ([place] 12/26/2019  .  Left nephrolithiasis 12/26/2019  . Hydronephrosis, left 12/26/2019  . Status post abdominal aortic aneurysm (AAA) repair 04/15/2019  . Candidiasis 02/22/2019  . Malignant neoplasm of descending colon ([place])   . Benign neoplasm of descending colon   . Benign neoplasm of transverse colon   . Do not intubate, cardiopulmonary resuscitation (CPR)-only code status 10/08/2018  . Prostate cancer ([place]) 02/25/2018  . Prostatic cyst 01/15/2018  . 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 ([place]) 09/15/2017  . Blood in stool   . Abnormal feces   . Stricture and stenosis of esophagus   . Mild cognitive impairment 08/18/2017  . Senile purpura ([place]) 04/29/2017  . Anemia 04/29/2017  . Frequent falls 04/29/2017  . Simple chronic bronchitis ([place]) 12/17/2016  . Benign localized hyperplasia  of prostate with urinary obstruction 07/15/2016  . Elevated PSA 07/15/2016  . Incomplete emptying of bladder 07/15/2016  . Urge incontinence 07/15/2016  . Hypothyroidism 04/25/2016  . Macular degeneration 04/25/2016  . Erectile dysfunction due to arterial insufficiency   . Ischemic heart disease with chronotropic incompetence   . CN (constipation) 02/10/2015  . DD (diverticular disease) 02/10/2015  . Weakness 02/10/2015  . Lichen planus 16/60/6004  . Restless leg 02/10/2015  . 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 B12 deficiency 02/10/2015  . COPD (chronic obstructive pulmonary disease) ([place]) 11/14/2014  . Post Inflammatory Lung Changes 11/14/2014  . Acute delirium 11/08/2014  . PAF (paroxysmal atrial fibrillation) (HCC) CHA2DS2-VASc = 4. AC = Eliquis   . Insomnia 12/09/2013  . OSA on CPAP   . Dyspnea on exertion - -essentially resolved with BB dose reduction & wgt loss 03/29/2013  . Overweight (BMI 25.0-29.9) -- Wgt back up 01/17/2013  . Atherosclerotic heart disease of native coronary artery without angina pectoris - s/p CABG, occluded SVG-D1; patent LIMA-LAD, SVG-OM, SVG- RCA   . Essential hypertension   . Hyperlipidemia with target LDL less than 70   . Aorto-iliac disease ([place])   . BCC (basal cell carcinoma), eyelid 01/03/2013  . Allergic rhinitis 08/17/2009  . Adaptation reaction 05/28/2009  . Acid reflux 05/28/2009  . Arthritis, degenerative 05/28/2009  . 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 Carotid artery occlusion without infarction 06/01/2006  . PAD (peripheral artery disease) - bilateral common iliac stents 12/15/2005  . Atherosclerotic heart disease of artery bypass graft 09/01/1997    Past Surgical History:  Procedure Laterality Date  . ABDOMINAL AORTIC ENDOVASCULAR STENT GRAFT Bilateral 05/18/2019   Procedure: ABDOMINAL AORTIC ENDOVASCULAR STENT GRAFT;  Surgeon: Algernon Huxley, MD;  Location: [facility] ORS;  Service: Vascular;  Laterality: Bilateral;  .  ANKLE SURGERY Left    ORIF   . Arch aortogram and carotid aortogram  06/02/2006   Dr Oneida Alar did surgery  . BASAL CELL CARCINOMA EXCISION  04/2016   Dermatology  . CARDIAC CATHETERIZATION  01/30/1998    (Dr. Linard Millers): Native LAD & mRCA CTO. 90% D1. Mod Cx. SVG-D1 CTO. SVG-RCA & SVG-OM along with LIMA-LAD patent;  LV NORMAL Fxn  . CAROTID ENDARTERECTOMY Left Oct. 29, 2007   Dr. Oneida Alar:  . CATARACT EXTRACTION W/PHACO Right 03/05/2017   Procedure: CATARACT EXTRACTION PHACO AND INTRAOCULAR LENS PLACEMENT (IOC);  Surgeon: Leandrew Koyanagi, MD;  Location: [facility] ORS;  Service: Ophthalmology;  Laterality: Right;  [place] 00:58.5 AP% 10.6 CDE 6.20 Fluid Pack lot # [id number]  . CATARACT EXTRACTION W/PHACO Left 04/15/2017   Procedure: CATARACT EXTRACTION PHACO AND INTRAOCULAR LENS PLACEMENT ([place])  Left;  Surgeon: Leandrew Koyanagi, MD;  Location: [facility];  Service:  Ophthalmology;  Laterality: Left;  . COLONOSCOPY WITH PROPOFOL N/A 09/15/2017   Procedure: COLONOSCOPY WITH PROPOFOL;  Surgeon: Lucilla Lame, MD;  Location: [facility] ENDOSCOPY;  Service: Endoscopy;  Laterality: N/A;  . COLONOSCOPY WITH PROPOFOL N/A 10/26/2018   Procedure: COLONOSCOPY WITH PROPOFOL;  Surgeon: Lucilla Lame, MD;  [facility];  Service: Endoscopy;  Laterality: N/A;  . CORONARY ARTERY BYPASS GRAFT  1998   LIMA-LAD, SVG-OM, SVG-RPDA, SVG-DIAG  . CPET / MET  12/2012   Mild chronotropic incompetence - read 82% of predicted; also reduced effort; peak VO2 15.7 / 75% (did not reach Max effort) -- suggested ischemic response in last 1.5 minutes of exercise. Normal pulmonary function on PFTs but poor response to  . CYSTOSCOPY W/ RETROGRADES Left 01/13/2020   Procedure: CYSTOSCOPY WITH RETROGRADE PYELOGRAM;  Surgeon: Billey Co, MD;  Location: [facility] ORS;  Service: Urology;  Laterality: Left;  . CYSTOSCOPY WITH STENT PLACEMENT Left 12/26/2019   Procedure: CYSTOSCOPY WITH STENT PLACEMENT;  Surgeon: Billey Co, MD;   Location: [facility] ORS;  Service: Urology;  Laterality: Left;  . CYSTOSCOPY/URETEROSCOPY/HOLMIUM LASER/STENT PLACEMENT Left 01/13/2020   Procedure: CYSTOSCOPY/URETEROSCOPY/HOLMIUM LASER/STENT EXCHANGE;  Surgeon: Billey Co, MD;  Location: [facility] ORS;  Service: Urology;  Laterality: Left;  . ENDOVASCULAR REPAIR/STENT GRAFT N/A 05/18/2019   Procedure: ENDOVASCULAR REPAIR/STENT GRAFT;  Surgeon: Algernon Huxley, MD;  Location: [facility] INVASIVE [facility] LAB;; EVAR - 23 mm prox, 12 cm distal & x 18 cm length Gore Excluder Endoprosthesis Main body (via RFA distal to lowest Renal A); 63m x 14 cm L Contralateral Limb for L Iliac - extended with 8 mm x 6cm LifeStar stent; no additional R Iliac PTA needed.   . ESOPHAGOGASTRODUODENOSCOPY (EGD) WITH PROPOFOL N/A 09/15/2017   Procedure: ESOPHAGOGASTRODUODENOSCOPY (EGD) WITH PROPOFOL;  Surgeon: WLucilla Lame MD;  [facility];  Service: Endoscopy;  Laterality: N/A;  . FRACTURE SURGERY Right 03/19/2015   wrist  . FRACTURE SURGERY Left   . HIATAL HERNIA REPAIR    . ILIAC ARTERY STENT  12/15/2005   PTA and direct stenting rgt and lft common iliac arteries  . KYPHOPLASTY N/A 04/05/2019   Procedure: KYPHOPLASTY T12, L3, L4 L5;  Surgeon: MHessie Knows MD;  Location: [facility] ORS;  Service: Orthopedics;  Laterality: N/A;  . NM GATED MYOVIEW ([place])  05/'19; 8/'20   a) [facility]: Low Risk - no ischemia or infarct.  EF > 65%. no RWMA;; b) ([facility]: Normal wall motion.  No ischemia or infarction.  . OPEN REDUCTION INTERNAL FIXATION (ORIF) DISTAL RADIAL FRACTURE Left 01/13/2019   Procedure: OPEN REDUCTION INTERNAL FIXATION (ORIF) DISTAL  RADIAL FRACTURE - LEFT - SLEEP APNEA;  Surgeon: MHessie Knows MD;  Location: [facility] ORS;  Service: Orthopedics;  Laterality: Left;  . ORIF ELBOW FRACTURE Right 01/01/2017   Procedure: OPEN REDUCTION INTERNAL FIXATION (ORIF) ELBOW/OLECRANON FRACTURE;  Surgeon: MHessie Knows MD;  Location: [facility] ORS;  Service: Orthopedics;  Laterality: Right;  .  ORIF WRIST FRACTURE Right 03/19/2015   Procedure: OPEN REDUCTION INTERNAL FIXATION (ORIF) WRIST FRACTURE;  Surgeon: MHessie Knows MD;  Location: [facility] ORS;  Service: Orthopedics;  Laterality: Right;  . SHOULDER ARTHROSCOPY Right   . THORACIC AORTA - CAROTID ANGIOGRAM  October 2007   Dr. 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 TRANSTHORACIC ECHOCARDIOGRAM  February 2016   [facility]: Normal LV function. Dilated left atrium.  .Domingo DimesECHOCARDIOGRAM  04/2019   [facility] EF 55-60 %.  Mild LVH.  Relatively normal valves.       Home Medications    Prior to Admission medications   Medication Sig Start Date End Date Taking? Authorizing Provider  acetaminophen (TYLENOL) 500 MG tablet Take 500-1,000 mg by mouth every 6 (six) hours as needed for mild pain or moderate pain.    Yes [provider]  Ascorbic Acid (VITAMIN C) 1000 MG tablet Take 1,000 mg by mouth daily.   Yes [provider]  Cyanocobalamin (VITAMIN B-12) 500 MCG SUBL Place 500 mcg under the tongue daily.    Yes [provider]  ELIQUIS 5 MG TABS tablet TAKE 1 TABLET BY MOUTH TWICE DAILY 01/14/19  Yes Leonie Man, MD  ezetimibe (ZETIA) 10 MG tablet Take 1 tablet (10 mg total) by mouth daily. 04/19/19  Yes [name], MD  finasteride (PROSCAR) 5 MG tablet TAKE ONE TABLET BY MOUTH EVERY DAY 01/02/20  Yes Stoioff, Ronda Fairly, MD  fluticasone (FLONASE) 50 MCG/ACT nasal spray PLACE TWO SPRAYS IN BOTH NOSRTILS EVERY DAY AS NEEDED FOR ALLERGIES 10/28/19  Yes [name], MD  Magnesium Oxide 500 MG TABS Take 500 mg by mouth every evening.   Yes [provider]  metoprolol tartrate (LOPRESSOR) 25 MG tablet Take 1 tablet (25 mg total) by mouth 2 (two) times daily. *OFFICE VISIT NEEDED FOR FURTHER REFILLS* 05/06/19  Yes Leonie Man, MD  Multiple Vitamins-Minerals (PRESERVISION AREDS 2 PO)  Take 1 tablet by mouth 2 (two) times daily.    Yes [provider]  MYRBETRIQ 50 MG TB24 tablet TAKE ONE TABLET BY MOUTH EVERY DAY 01/02/20  Yes Stoioff, Ronda Fairly, MD  nitroGLYCERIN (NITROSTAT) 0.4 MG SL tablet Place 1 tablet (0.4 mg total) under the tongue every 5 (five) minutes as needed for chest pain. 01/12/17  Yes [name], MD  pantoprazole (PROTONIX) 40 MG tablet TAKE ONE TABLET BY MOUTH EVERY DAY 11/02/19  Yes [name], MD  QUEtiapine (SEROQUEL) 25 MG tablet Take by mouth.   Yes [provider]  rosuvastatin (CRESTOR) 20 MG tablet TAKE ONE TABLET BY MOUTH EVERY DAY 10/01/19  Yes [name], MD  tamsulosin (FLOMAX) 0.4 MG CAPS capsule Take 1 capsule (0.4 mg total) by mouth every evening. 06/07/19  Yes Stoioff, Ronda Fairly, MD  umeclidinium-vilanterol (ANORO ELLIPTA) 62.5-25 MCG/INH AEPB Inhale 1 puff into the lungs daily.  10/25/19  Yes [provider]  doxycycline (VIBRAMYCIN) 100 MG capsule Take 1 capsule (100 mg total) by mouth 2 (two) times daily for 5 days. 04/15/20 04/20/20  Chase Picket, MD  lisinopril (ZESTRIL) 2.5 MG tablet Take 1 tablet (2.5 mg total) by mouth daily. 01/02/20 04/01/20  Leonie Man, MD    Family History Family History  Problem Relation Age of Onset  . Ulcers Mother        Peptic  . Dementia Mother   . Alcohol abuse Father     Social History Social History   Tobacco Use  . Smoking status: Former Smoker    Types: Cigarettes    Quit date: 08/04/1997    Years since quitting: 22.7  . Smokeless tobacco: Never Used  Vaping Use  . Vaping Use: Never used  Substance Use Topics  . Alcohol use: No  . Drug use: No     Allergies   Clonazepam, Codeine, Ropinirole, Hydrocodone, Niacin and related, Oxycodone, and Tramadol   Review of Systems Review of Systems  Skin:  Positive for color change and wound.     Physical Exam Triage Vital Signs ED Triage Vitals  Enc Vitals Group     BP 04/15/20 1048 (!) 166/80      Pulse Rate 04/15/20 1048 84     Resp 04/15/20 1048 18     Temp 04/15/20 1048 98.1 F (36.7 C)     Temp Source 04/15/20 1048 Oral     SpO2 04/15/20 1048 98 %     Weight 04/15/20 1050 186 lb (84.4 kg)     Height 04/15/20 1050 5' 10.5" (1.791 m)     Head Circumference --      Peak Flow --      Pain Score 04/15/20 1049 7     Pain Loc --      Pain Edu? --      Excl. in [place]? --    No data found.  Updated Vital Signs BP (!) 166/80 (BP Location: Left Arm)   Pulse 84   Temp 98.1 F (36.7 C) (Oral)   Resp 18   Ht 5' 10.5" (1.791 m)   Wt 84.4 kg   SpO2 98%   BMI 26.31 kg/m   Visual Acuity Right Eye Distance:   Left Eye Distance:   Bilateral Distance:    Right Eye Near:   Left Eye Near:    Bilateral Near:     Physical Exam Constitutional:      General: He is not in acute distress.    Appearance: Normal appearance. He is not ill-appearing.  Abdominal:     General: Bowel sounds are normal.     Palpations: Abdomen is soft.  Musculoskeletal:        General: Normal range of motion.  Skin:    Coloration: Skin is not jaundiced.     Findings: Erythema present. No lesion.  Neurological:     Mental Status: He is alert.      UC Treatments / Results  Labs (all labs ordered are listed, but only abnormal results are displayed) Labs Reviewed - No data to display  EKG   Radiology No results found.  Procedures Procedures (including critical care time)  Medications Ordered in UC Medications - No data to display  Initial Impression / Assessment and Plan / UC Course  I have reviewed the triage vital signs and the nursing notes.  Pertinent labs & imaging results that were available during my care of the patient were reviewed by me and considered in my medical decision making (see chart for details).     1.  Infected wound on the left upper extremity: Bacitracin application with dressing changes daily Patient's wife is advised not to tighten the bandage  excessively Elevation of the left upper extremity Patient's wife is advised to use nonadherent dressing. Return precautions given Doxycycline 100 mg twice daily for 5 days. Final Clinical Impressions(s) / UC Diagnoses   Final diagnoses:  Infected wound     Discharge Instructions     Please use antibiotic ointment with each dressing change Please use nonadherent dressing Avoid tight bandaging Take antibiotics as prescribed If symptoms worsen return to the urgent care to be reevaluated.   ED Prescriptions    Medication Sig Dispense Auth. Provider   doxycycline (VIBRAMYCIN) 100 MG capsule Take 1 capsule (100 mg total) by mouth 2 (two) times daily for 5 days. 10 capsule Tremel Setters, Myrene Galas, MD     PDMP not reviewed this encounter.   Chase Picket, MD 04/15/20 1251

## 2020-04-17 ENCOUNTER — Ambulatory Visit: Payer: Medicare Other

## 2020-04-17 ENCOUNTER — Other Ambulatory Visit: Payer: Self-pay

## 2020-04-17 ENCOUNTER — Ambulatory Visit
Admission: RE | Admit: 2020-04-17 | Discharge: 2020-04-17 | Disposition: A | Discharge status: Home / Self Care | Payer: Medicare Other | Source: Ambulatory Visit | Attending: Neurology | Admitting: Neurology

## 2020-04-17 DIAGNOSIS — R2681 Unsteadiness on feet: Secondary | ICD-10-CM

## 2020-04-17 DIAGNOSIS — R413 Other amnesia: Secondary | ICD-10-CM | POA: Insufficient documentation

## 2020-04-17 DIAGNOSIS — G8929 Other chronic pain: Secondary | ICD-10-CM

## 2020-04-17 DIAGNOSIS — M6281 Muscle weakness (generalized): Secondary | ICD-10-CM

## 2020-04-17 DIAGNOSIS — M545 Low back pain, unspecified: Secondary | ICD-10-CM

## 2020-04-17 DIAGNOSIS — E569 Vitamin deficiency, unspecified: Secondary | ICD-10-CM | POA: Diagnosis present

## 2020-04-17 DIAGNOSIS — R2689 Other abnormalities of gait and mobility: Secondary | ICD-10-CM

## 2020-04-17 NOTE — Therapy (Signed)
[place] MAIN [facility] SERVICES [address] [place], [place], [address] Phone: [phone]   Fax:  [phone]  Physical Therapy Treatment Physical Therapy Progress Note   Dates of reporting period  02/28/20   to   04/17/20  Patient Details  Name: [name] MRN: [id number] Date of Birth: May 30, 1934 Referring Provider (PT): Meeler, Sherren Kerns FNP   Encounter Date: 04/17/2020   PT End of Session - 04/17/20 1340    Visit Number 10    Number of Visits 16    Date for PT Re-Evaluation 04/24/20    Authorization Type 10/10 eval 6/29; next session 1/10 PN 04/17/20    PT Start Time 0930    PT Stop Time 1014    PT Time Calculation (min) 44 min    Equipment Utilized During Treatment Gait belt    Activity Tolerance Patient tolerated treatment well;Patient limited by fatigue    Behavior During Therapy WFL for tasks assessed/performed           Past Medical History:  Diagnosis Date  . AAA (abdominal aortic aneurysm) ([place]) 05/2019   Relativley Rapid progression from June-Aug 2020 (4.6 - 5.4 cm): s/p EVAR (Dr. Lucky Cowboy [facility]): EVAR - 23 mm prox, 12 cm distal & x 18 cm length Gore Excluder Endoprosthesis Main body (via RFA distal to lowest Renal A); 75m x 14 cm L Contralateral Limb for L Iliac - extended with 8 mm x 6cm LifeStar stent (post-dilated with 7 mm DEB).  Additional R Iliac PTA not required.   . Arthritis   . Basal cell carcinoma of eyelid 01/03/2013   2019 - R side of Nose  . Benign neoplasm of ascending colon   . Benign neoplasm of descending colon   . Bilateral iliac artery stenosis ([place] 2007   Bilateral Iliac A stenting; extension of EVAR limbs into both Iliacs -- additional stent placed in L Iliac.  . Cancer ([place]    Skin cancer  . Colon polyps   . Complication of anesthesia    severe confusion agitation requiring hospital admission x 4 days  . Compression fracture of fourth lumbar vertebra ([place] 04/2019   - s/p Kyphoplasty; T12,  L3-5 ([facility]  . COPD (chronic obstructive pulmonary disease) (HCC)    Centrilobular Emphysema  . Coronary artery disease involving native coronary artery without angina pectoris 1998   - s/p CABG, occluded SVG-D1; patent LIMA-LAD, SVG-OM, SVG- RCA  . Dementia ([place]    memory loss - started noting late 2019  . Falls frequently    broke foot  . Fracture 2021   left foot  . GERD (gastroesophageal reflux disease)   . Hemorrhoid 02/10/2015  . History of hiatal hernia   . History of kidney stones   . HOH (hard of hearing)   . Hypertension   . Lewy body dementia ([place] 04/04/2020  . Macular degeneration disease   . Osteoporosis   . PAF (paroxysmal atrial fibrillation) ([place]    on Eliquis for OAC - Rate Control   . Prostate cancer (HGoehner   . Prostate disorder   . Sciatic pain    Chronic  . Sleep apnea    cpap  . Temporary cerebral vascular dysfunction 02/10/2015   Had negative work-up.  July, 2012.  No medication changes, had forgot them that day, got dehydrated.   . Urinary incontinence     Past Surgical History:  Procedure Laterality Date  . ABDOMINAL AORTIC ENDOVASCULAR STENT GRAFT Bilateral 05/18/2019  Procedure: ABDOMINAL AORTIC ENDOVASCULAR STENT GRAFT;  Surgeon: Algernon Huxley, MD;  Location: [facility] ORS;  Service: Vascular;  Laterality: Bilateral;  . ANKLE SURGERY Left    ORIF   . Arch aortogram and carotid aortogram  06/02/2006   Dr Oneida Alar did surgery  . BASAL CELL CARCINOMA EXCISION  04/2016   Dermatology  . CARDIAC CATHETERIZATION  01/30/1998    (Dr. Linard Millers): Native LAD & mRCA CTO. 90% D1. Mod Cx. SVG-D1 CTO. SVG-RCA & SVG-OM along with LIMA-LAD patent;  LV NORMAL Fxn  . CAROTID ENDARTERECTOMY Left Oct. 29, 2007   Dr. Oneida Alar:  . CATARACT EXTRACTION W/PHACO Right 03/05/2017   Procedure: CATARACT EXTRACTION PHACO AND INTRAOCULAR LENS PLACEMENT (IOC);  Surgeon: Leandrew Koyanagi, MD;  Location: [facility] ORS;  Service: Ophthalmology;  Laterality: Right;  [place] 00:58.5 AP% 10.6 CDE  6.20 Fluid Pack lot # [id number]  . CATARACT EXTRACTION W/PHACO Left 04/15/2017   Procedure: CATARACT EXTRACTION PHACO AND INTRAOCULAR LENS PLACEMENT ([place])  Left;  Surgeon: Leandrew Koyanagi, MD;  Location: [place];  Service: Ophthalmology;  Laterality: Left;  . COLONOSCOPY WITH PROPOFOL N/A 09/15/2017   Procedure: COLONOSCOPY WITH PROPOFOL;  Surgeon: Lucilla Lame, MD;  Location: [facility] ENDOSCOPY;  Service: Endoscopy;  Laterality: N/A;  . COLONOSCOPY WITH PROPOFOL N/A 10/26/2018   Procedure: COLONOSCOPY WITH PROPOFOL;  Surgeon: Lucilla Lame, MD;  Location: [facility] ENDOSCOPY;  Service: Endoscopy;  Laterality: N/A;  . CORONARY ARTERY BYPASS GRAFT  1998   LIMA-LAD, SVG-OM, SVG-RPDA, SVG-DIAG  . CPET / MET  12/2012   Mild chronotropic incompetence - read 82% of predicted; also reduced effort; peak VO2 15.7 / 75% (did not reach Max effort) -- suggested ischemic response in last 1.5 minutes of exercise. Normal pulmonary function on PFTs but poor response to  . CYSTOSCOPY W/ RETROGRADES Left 01/13/2020   Procedure: CYSTOSCOPY WITH RETROGRADE PYELOGRAM;  Surgeon: Billey Co, MD;  Location: [facility] ORS;  Service: Urology;  Laterality: Left;  . CYSTOSCOPY WITH STENT PLACEMENT Left 12/26/2019   Procedure: CYSTOSCOPY WITH STENT PLACEMENT;  Surgeon: Billey Co, MD;  Location: [facility] ORS;  Service: Urology;  Laterality: Left;  . CYSTOSCOPY/URETEROSCOPY/HOLMIUM LASER/STENT PLACEMENT Left 01/13/2020   Procedure: CYSTOSCOPY/URETEROSCOPY/HOLMIUM LASER/STENT EXCHANGE;  Surgeon: Billey Co, MD;  Location: [facility] ORS;  Service: Urology;  Laterality: Left;  . ENDOVASCULAR REPAIR/STENT GRAFT N/A 05/18/2019   Procedure: ENDOVASCULAR REPAIR/STENT GRAFT;  Surgeon: Algernon Huxley, MD;  Location: [facility] INVASIVE CV LAB;; EVAR - 23 mm prox, 12 cm distal & x 18 cm length Gore Excluder Endoprosthesis Main body (via RFA distal to lowest Renal A); 71m x 14 cm L Contralateral Limb for L Iliac - extended with 8 mm x 6cm  LifeStar stent; no additional R Iliac PTA needed.   . ESOPHAGOGASTRODUODENOSCOPY (EGD) WITH PROPOFOL N/A 09/15/2017   Procedure: ESOPHAGOGASTRODUODENOSCOPY (EGD) WITH PROPOFOL;  Surgeon: WLucilla Lame MD;  [facility];  Service: Endoscopy;  Laterality: N/A;  . FRACTURE SURGERY Right 03/19/2015   wrist  . FRACTURE SURGERY Left   . HIATAL HERNIA REPAIR    . ILIAC ARTERY STENT  12/15/2005   PTA and direct stenting rgt and lft common iliac arteries  . KYPHOPLASTY N/A 04/05/2019   Procedure: KYPHOPLASTY T12, L3, L4 L5;  Surgeon: MHessie Knows MD;  Location: [facility] ORS;  Service: Orthopedics;  Laterality: N/A;  . NM GATED MYOVIEW ([place])  05/'19; 8/'20   a) [facility]: Low Risk - no ischemia or infarct.  EF > 65%. no RWMA;; b) ([facility]): Normal wall motion.  No ischemia or infarction.  . OPEN REDUCTION INTERNAL FIXATION (ORIF) DISTAL RADIAL FRACTURE Left 01/13/2019   Procedure: OPEN REDUCTION INTERNAL FIXATION (ORIF) DISTAL  RADIAL FRACTURE - LEFT - SLEEP APNEA;  Surgeon: Hessie Knows, MD;  Location: [facility] ORS;  Service: Orthopedics;  Laterality: Left;  . ORIF ELBOW FRACTURE Right 01/01/2017   Procedure: OPEN REDUCTION INTERNAL FIXATION (ORIF) ELBOW/OLECRANON FRACTURE;  Surgeon: Hessie Knows, MD;  Location: [facility] ORS;  Service: Orthopedics;  Laterality: Right;  . ORIF WRIST FRACTURE Right 03/19/2015   Procedure: OPEN REDUCTION INTERNAL FIXATION (ORIF) WRIST FRACTURE;  Surgeon: Hessie Knows, MD;  Location: [facility] ORS;  Service: Orthopedics;  Laterality: Right;  . SHOULDER ARTHROSCOPY Right   . THORACIC AORTA - CAROTID ANGIOGRAM  October 2007   Dr. 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 TRANSTHORACIC ECHOCARDIOGRAM  February 2016   [facility]: Normal LV function. Dilated left atrium.  Domingo Dimes ECHOCARDIOGRAM  04/2019   [facility] April 20, 2019): EF 55-60 %.  Mild LVH.  Relatively normal valves.     There were no vitals filed for this visit.   Subjective Assessment - 04/17/20 1338    Subjective Went to the urgent care again for an infected wound from a previous fall. Will call physician about LSVT referral to initiate process.    Pertinent History Patient is a pleasant 86 year old who was last seen by this PT on 12/22/19; since then has been admitted to the hospital and had other medical complications including: two surgeries for kidney stones, another one for his broken L foot. Was in a boot for 6 weeks (boot came off a week ago), and procedures for his back. Has been having hallucinations during the day and night time with increased rate frequently  Pain in back is associated with weakness, had facet joint injections. He has a history of kyphoplasty to T12, L3, L4, L5 performed in August 2020 by Dr. Rudene Christians. [facility] dated 03/28/2019 report was reviewed today. Acute compression fractures are noted at T12, L3, and L4. He has facet arthropathy at the lower lumbar levels. At L3-4 he has moderate central stenosis. PMH includes AAA, arthritis, atherosclerotic heart disease (s/p CABG), benign neoplasm of ascending/descending colon, COPD, CAD, dementia, dysrhythmia, falls, GERD, hemorrhoid, HOH, HTN, PAF, prostate cancer, osteoporosis, kidney stones, macular degeneration.    Limitations Walking;Standing;House hold activities;Other (comment);Lifting;Sitting    How long can you sit comfortably? 5 minutes    How long can you stand comfortably? needs a walker now, unsteady once standing    How long can you walk comfortably? uses a walker now; can walk in house.    Diagnostic tests MRI [facility] dated 03/28/2019 report was reviewed today. Acute compression fractures are noted at T12, L3, and L4. He has facet arthropathy at the lower lumbar levels. At L3-4 he has moderate central stenosis.    Patient Stated Goals decrease pain and improve balance.     Currently in Pain? Yes    Pain Score 2     Pain Location Back    Pain Orientation Lower    Pain Descriptors / Indicators Aching    Pain Type Chronic pain    Pain Onset More than a month ago    Pain Frequency Intermittent              [facility] PT Assessment - 04/17/20 0001  Standardized Balance Assessment   Standardized Balance Assessment Berg Balance Test      Berg Balance Test   Sit to Stand Able to stand  independently using hands    Standing Unsupported Able to stand 2 minutes with supervision    Sitting with Back Unsupported but Feet Supported on Floor or Stool Able to sit safely and securely 2 minutes    Stand to Sit Sits safely with minimal use of hands    Transfers Able to transfer safely, definite need of hands    Standing Unsupported with Eyes Closed Able to stand 10 seconds with supervision    Standing Unsupported with Feet Together Able to place feet together independently but unable to hold for 30 seconds    From Standing, Reach Forward with Outstretched Arm Can reach forward >5 cm safely (2")    From Standing Position, Pick up Object from Floor Able to pick up shoe, needs supervision    From Standing Position, Turn to Look Behind Over each Shoulder Looks behind one side only/other side shows less weight shift    Turn 360 Degrees Able to turn 360 degrees safely but slowly    Standing Unsupported, Alternately Place Feet on Step/Stool Able to complete 4 steps without aid or supervision    Standing Unsupported, One Foot in Front Able to take small step independently and hold 30 seconds    Standing on One Leg Tries to lift leg/unable to hold 3 seconds but remains standing independently    Total Score 37            Vitals at start of session: 120/80   Progress note Goals; Able to perform 4 sit to stands with no UE support 5x STS: 14.9 seconds with BUE support against side of chair FOTO: 43.6 %  BERG: 37/ 56  10 MWT: 8.05 with rollator BLE strength: L df 3+;  everything else grossly 4/5  Work without pain    Treatment:  Ambulate with rollator: horizontal head turns 86 ft, vetical head turns 86 ft Ambulate around perimeter of gym searching for cones with horizontal head turns; has difficulty with seeing cone despite it being in front of him.   Seated:  BTB adduction 15x  BTB around ankles, alternating LAQ 15x each LE      Pt educated throughout session about proper posture and technique with exercises. Improved exercise technique, movement at target joints, use of target muscles after min to mod verbal, visual, tactile cues.    Patient's condition has the potential to improve in response to therapy. Maximum improvement is yet to be obtained. The anticipated improvement is attainable and reasonable in a generally predictable time.  Patient reports he is getting stronger and moving more.                  PT Education - 04/17/20 1340    Education provided Yes    Education Details goals, gait mechanics with visual scan    Person(s) Educated Patient;Child(ren)    Methods Explanation;Demonstration;Tactile cues;Verbal cues    Comprehension Verbalized understanding;Returned demonstration;Verbal cues required;Tactile cues required            PT Short Term Goals - 04/17/20 1342      PT SHORT TERM GOAL #1   Title Patient will be independent in home exercise program to improve strength/mobility for better functional independence with ADLs.    Baseline 6/29: HEP given 8/17: HEP compliant with son    Time 4    Period  Weeks    Status Partially Met    Target Date 03/27/20      PT SHORT TERM GOAL #2   Title Patient will perform 5 sit to stand transfers without UE support to increase functional mobility for carryover to community activities.    Baseline 6/29; only able to perform 2 8/17: able to perform 4    Time 4    Period Weeks    Status Partially Met    Target Date 03/27/20             PT Long Term Goals - 04/17/20 0949       PT LONG TERM GOAL #1   Title Patient will increase FOTO score to equal to or greater than 54% to demonstrate statistically significant improvement in mobility and quality of life.    Baseline 6/29: 54% 8/17: 43.6%    Time 8    Period Weeks    Status On-going    Target Date 04/24/20      PT LONG TERM GOAL #2   Title Patient will demonstrate an improved Berg Balance Score of > 36 as to demonstrate improved balance with ADLs such as sitting/standing and transfer balance and reduced fall risk.    Baseline 6/29: 30/56 8/17: 37/56    Time 8    Period Weeks    Status Achieved    Target Date 04/24/20      PT LONG TERM GOAL #3   Title Patient will increase 10 meter walk test to >1.20ms as to improve gait speed for better community ambulation and to reduce fall risk.    Baseline 6/29: 0.568m with RW 8/17: >1.0 with rollator    Time 8    Period Weeks    Status Achieved      PT LONG TERM GOAL #4   Title Patient will increase BLE gross strength to 4+/5 as to improve functional strength for independent gait, increased standing tolerance and increased ADL ability.    Baseline 6/29: see note 8/17: see note    Time 8    Period Weeks    Status Partially Met    Target Date 04/24/20      PT LONG TERM GOAL #5   Title Patient will be able to perform household work/ chores without increase in symptoms.    Baseline 6/29: unable to perform 8/17: limited by balance and cognition as well as pain    Time 8    Period Weeks    Status Partially Met    Target Date 04/24/20                 Plan - 04/17/20 1342    Clinical Impression Statement Patient demonstrates excellent progression towards functional goals with a 7 point increase in BERG score, near doubling of 10 MWT gait speed, and increased transfer speed. His recent increase in falling is in partial potentially related to his recent cognitive decline with decreased safety awareness and decreased central visual field of R eye. Patient's  condition has the potential to improve in response to therapy. Maximum improvement is yet to be obtained. The anticipated improvement is attainable and reasonable in a generally predictable time. Patient would benefit from further skilled PT intervention to decrease pain and increase strength and stability for safer ADL performance and increased functional ability    Personal Factors and Comorbidities Age;Comorbidity 3+;Fitness;Past/Current Experience;Sex;Social Background;Time since onset of injury/illness/exacerbation;Transportation    Comorbidities AAA, arthritis, atherosclerotic heart disease, (s/p CABG), benign neoplasm of ascending/descending colon,  COPD, CAD, dementia, dysrhythmia, frequent falls, GERD, hemorrhoid, HOH, HTN, PAF, prostate cancer.    Examination-Activity Limitations Bathing;Bend;Carry;Dressing;Continence;Stairs;Squat;Reach Overhead;Locomotion Level;Lift;Stand;Transfers;Toileting;Bed Mobility    Examination-Participation Restrictions Church;Cleaning;Community Activity;Driving;Interpersonal Relationship;Laundry;Volunteer;Shop;Personal Finances;Meal Prep;Yard Work    [employer] Evolving/Moderate complexity    Rehab Potential Fair    PT Frequency 2x / week    PT Duration 8 weeks    PT Treatment/Interventions ADLs/Self Care Home Management;Aquatic Therapy;Cryotherapy;Moist Heat;Traction;Ultrasound;Therapeutic activities;Functional mobility training;Stair training;Gait training;DME Instruction;Therapeutic exercise;Balance training;Neuromuscular re-education;Patient/family education;Manual techniques;Passive range of motion;Energy conservation;Vestibular;Taping;Biofeedback;Electrical Stimulation;Iontophoresis 80m/ml Dexamethasone;Dry needling;Visual/perceptual remediation/compensation    PT Next Visit Plan core stability, LE strength, balance    PT Home Exercise Plan see above    Consulted and Agree with Plan of Care Patient           Patient will benefit  from skilled therapeutic intervention in order to improve the following deficits and impairments:  Abnormal gait, Cardiopulmonary status limiting activity, Decreased activity tolerance, Decreased balance, Decreased knowledge of precautions, Decreased endurance, Decreased coordination, Decreased cognition, Decreased knowledge of use of DME, Decreased mobility, Difficulty walking, Decreased safety awareness, Decreased strength, Impaired flexibility, Impaired perceived functional ability, Impaired sensation, Postural dysfunction, Improper body mechanics, Pain, Impaired vision/preception, Decreased range of motion  Visit Diagnosis: Unsteadiness on feet  Muscle weakness (generalized)  Other abnormalities of gait and mobility  Chronic low back pain, unspecified back pain laterality, unspecified whether sciatica present     Problem List Patient Active Problem List   Diagnosis Date Noted  . Hematuria   . Acute kidney injury superimposed on CKD (HC[place]   . Sepsis due to gram-negative UTI (HWashington 12/26/2019  . Left nephrolithiasis 12/26/2019  . Hydronephrosis, left 12/26/2019  . Status post abdominal aortic aneurysm (AAA) repair 04/15/2019  . Candidiasis 02/22/2019  . Malignant neoplasm of descending colon (HM[place]   . Benign neoplasm of descending colon   . Benign neoplasm of transverse colon   . Do not intubate, cardiopulmonary resuscitation (CPR)-only code status 10/08/2018  . Prostate cancer (HP[place] 02/25/2018  . Prostatic cyst 01/15/2018  . 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 ([place] 09/15/2017  . Blood in stool   . Abnormal feces   . Stricture and stenosis of esophagus   . Mild cognitive impairment 08/18/2017  . Senile purpura ([place] 04/29/2017  . Anemia 04/29/2017  . Frequent falls 04/29/2017  . Simple chronic bronchitis ([place] 12/17/2016  . Benign localized hyperplasia of prostate with urinary obstruction 07/15/2016  . Elevated PSA 07/15/2016  . Incomplete emptying of bladder 07/15/2016  .  Urge incontinence 07/15/2016  . Hypothyroidism 04/25/2016  . Macular degeneration 04/25/2016  . Erectile dysfunction due to arterial insufficiency   . Ischemic heart disease with chronotropic incompetence   . CN (constipation) 02/10/2015  . DD (diverticular disease) 02/10/2015  . Weakness 02/10/2015  . Lichen planus 053/29/9242 . Restless leg 02/10/2015  . 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 B12 deficiency 02/10/2015  . COPD (chronic obstructive pulmonary disease) ([place] 11/14/2014  . Post Inflammatory Lung Changes 11/14/2014  . Acute delirium 11/08/2014  . PAF (paroxysmal atrial fibrillation) (HCC) CHA2DS2-VASc = 4. AC = Eliquis   . Insomnia 12/09/2013  . OSA on CPAP   . Dyspnea on exertion - -essentially resolved with BB dose reduction & wgt loss 03/29/2013  . Overweight (BMI 25.0-29.9) -- Wgt back up 01/17/2013  . Atherosclerotic heart disease of native coronary artery without angina pectoris - s/p CABG, occluded SVG-D1; patent LIMA-LAD, SVG-OM, SVG- RCA   . Essential hypertension   . Hyperlipidemia with target LDL  less than 70   . Aorto-iliac disease ([place])   . BCC (basal cell carcinoma), eyelid 01/03/2013  . Allergic rhinitis 08/17/2009  . Adaptation reaction 05/28/2009  . Acid reflux 05/28/2009  . Arthritis, degenerative 05/28/2009  . 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 Carotid artery occlusion without infarction 06/01/2006  . PAD (peripheral artery disease) - bilateral common iliac stents 12/15/2005  . Atherosclerotic heart disease of artery bypass graft 09/01/1997   Janna Arch, PT, DPT   04/17/2020, 1:45 PM  [place] MAIN [facility] SERVICES [address] [place], [place], [address] Phone: [phone]   Fax:  [phone]  Name: [name] MRN: [id number] Date of Birth: 1933-12-23

## 2020-04-17 NOTE — Telephone Encounter (Signed)
Erroneous encounter

## 2020-04-19 ENCOUNTER — Ambulatory Visit: Payer: Medicare Other

## 2020-04-19 ENCOUNTER — Other Ambulatory Visit: Payer: Self-pay

## 2020-04-19 DIAGNOSIS — M545 Low back pain, unspecified: Secondary | ICD-10-CM

## 2020-04-19 DIAGNOSIS — R2681 Unsteadiness on feet: Secondary | ICD-10-CM

## 2020-04-19 DIAGNOSIS — G8929 Other chronic pain: Secondary | ICD-10-CM

## 2020-04-19 DIAGNOSIS — M6281 Muscle weakness (generalized): Secondary | ICD-10-CM

## 2020-04-19 DIAGNOSIS — R2689 Other abnormalities of gait and mobility: Secondary | ICD-10-CM

## 2020-04-19 NOTE — Therapy (Signed)
[place] MAIN [facility] SERVICES [address] [place], [place], [address] Phone: [phone]   Fax:  [phone]  Physical Therapy Treatment  Patient Details  Name: [name] MRN: [id number] Date of Birth: 01-21-1934 Referring Provider (PT): Meeler, Sherren Kerns FNP   Encounter Date: 04/19/2020   PT End of Session - 04/19/20 0939    Visit Number 11    Number of Visits 16    Date for PT Re-Evaluation 04/24/20    Authorization Type 1/10 PN 04/17/20    PT Start Time 0930    PT Stop Time 1014    PT Time Calculation (min) 44 min    Equipment Utilized During Treatment Gait belt    Activity Tolerance Patient tolerated treatment well;Patient limited by fatigue    Behavior During Therapy [facility] for tasks assessed/performed           Past Medical History:  Diagnosis Date  . AAA (abdominal aortic aneurysm) ([place]) 05/2019   [place] Rapid progression from June-Aug 2020 (4.6 - 5.4 cm): s/p EVAR (Dr. Lucky Cowboy [facility]): EVAR - 23 mm prox, 12 cm distal & x 18 cm length Gore Excluder Endoprosthesis Main body (via RFA distal to lowest Renal A); 45m x 14 cm L Contralateral Limb for L Iliac - extended with 8 mm x 6cm LifeStar stent (post-dilated with 7 mm DEB).  Additional R Iliac PTA not required.   . Arthritis   . Basal cell carcinoma of eyelid 01/03/2013   2019 - R side of Nose  . Benign neoplasm of ascending colon   . Benign neoplasm of descending colon   . Bilateral iliac artery stenosis ([place] 2007   Bilateral Iliac A stenting; extension of EVAR limbs into both Iliacs -- additional stent placed in L Iliac.  . Cancer ([place]    Skin cancer  . Colon polyps   . Complication of anesthesia    severe confusion agitation requiring hospital admission x 4 days  . Compression fracture of fourth lumbar vertebra ([place] 04/2019   - s/p Kyphoplasty; T12, L3-5 ([facility]  . COPD (chronic obstructive pulmonary disease) (HCC)    Centrilobular Emphysema  . Coronary artery  disease involving native coronary artery without angina pectoris 1998   - s/p CABG, occluded SVG-D1; patent LIMA-LAD, SVG-OM, SVG- RCA  . Dementia ([place]    memory loss - started noting late 2019  . Falls frequently    broke foot  . Fracture 2021   left foot  . GERD (gastroesophageal reflux disease)   . Hemorrhoid 02/10/2015  . History of hiatal hernia   . History of kidney stones   . HOH (hard of hearing)   . Hypertension   . Lewy body dementia ([place] 04/04/2020  . Macular degeneration disease   . Osteoporosis   . PAF (paroxysmal atrial fibrillation) ([place]    on Eliquis for OAC - Rate Control   . Prostate cancer ([place]   . Prostate disorder   . Sciatic pain    Chronic  . Sleep apnea    cpap  . Temporary cerebral vascular dysfunction 02/10/2015   Had negative work-up.  July, 2012.  No medication changes, had forgot them that day, got dehydrated.   . Urinary incontinence     Past Surgical History:  Procedure Laterality Date  . ABDOMINAL AORTIC ENDOVASCULAR STENT GRAFT Bilateral 05/18/2019   Procedure: ABDOMINAL AORTIC ENDOVASCULAR STENT GRAFT;  Surgeon: DAlgernon Huxley MD;  Location: [facility] ORS;  Service: Vascular;  Laterality: Bilateral;  .  ANKLE SURGERY Left    ORIF   . Arch aortogram and carotid aortogram  06/02/2006   Dr Oneida Alar did surgery  . BASAL CELL CARCINOMA EXCISION  04/2016   Dermatology  . CARDIAC CATHETERIZATION  01/30/1998    (Dr. Linard Millers): Native LAD & mRCA CTO. 90% D1. Mod Cx. SVG-D1 CTO. SVG-RCA & SVG-OM along with LIMA-LAD patent;  LV NORMAL Fxn  . CAROTID ENDARTERECTOMY Left Oct. 29, 2007   Dr. Oneida Alar:  . CATARACT EXTRACTION W/PHACO Right 03/05/2017   Procedure: CATARACT EXTRACTION PHACO AND INTRAOCULAR LENS PLACEMENT (IOC);  Surgeon: Leandrew Koyanagi, MD;  Location: [facility] ORS;  Service: Ophthalmology;  Laterality: Right;  [place] 00:58.5 AP% 10.6 CDE 6.20 Fluid Pack lot # [id number]  . CATARACT EXTRACTION W/PHACO Left 04/15/2017   Procedure: CATARACT EXTRACTION  PHACO AND INTRAOCULAR LENS PLACEMENT ([place])  Left;  Surgeon: Leandrew Koyanagi, MD;  Location: [place];  Service: Ophthalmology;  Laterality: Left;  . COLONOSCOPY WITH PROPOFOL N/A 09/15/2017   Procedure: COLONOSCOPY WITH PROPOFOL;  Surgeon: Lucilla Lame, MD;  Location: [facility] ENDOSCOPY;  Service: Endoscopy;  Laterality: N/A;  . COLONOSCOPY WITH PROPOFOL N/A 10/26/2018   Procedure: COLONOSCOPY WITH PROPOFOL;  Surgeon: Lucilla Lame, MD;  Location: [facility] ENDOSCOPY;  Service: Endoscopy;  Laterality: N/A;  . CORONARY ARTERY BYPASS GRAFT  1998   LIMA-LAD, SVG-OM, SVG-RPDA, SVG-DIAG  . CPET / MET  12/2012   Mild chronotropic incompetence - read 82% of predicted; also reduced effort; peak VO2 15.7 / 75% (did not reach Max effort) -- suggested ischemic response in last 1.5 minutes of exercise. Normal pulmonary function on PFTs but poor response to  . CYSTOSCOPY W/ RETROGRADES Left 01/13/2020   Procedure: CYSTOSCOPY WITH RETROGRADE PYELOGRAM;  Surgeon: Billey Co, MD;  Location: [facility] ORS;  Service: Urology;  Laterality: Left;  . CYSTOSCOPY WITH STENT PLACEMENT Left 12/26/2019   Procedure: CYSTOSCOPY WITH STENT PLACEMENT;  Surgeon: Billey Co, MD;  Location: [facility] ORS;  Service: Urology;  Laterality: Left;  . CYSTOSCOPY/URETEROSCOPY/HOLMIUM LASER/STENT PLACEMENT Left 01/13/2020   Procedure: CYSTOSCOPY/URETEROSCOPY/HOLMIUM LASER/STENT EXCHANGE;  Surgeon: Billey Co, MD;  Location: [facility] ORS;  Service: Urology;  Laterality: Left;  . ENDOVASCULAR REPAIR/STENT GRAFT N/A 05/18/2019   Procedure: ENDOVASCULAR REPAIR/STENT GRAFT;  Surgeon: Algernon Huxley, MD;  Location: [facility] INVASIVE CV LAB;; EVAR - 23 mm prox, 12 cm distal & x 18 cm length Gore Excluder Endoprosthesis Main body (via RFA distal to lowest Renal A); 49m x 14 cm L Contralateral Limb for L Iliac - extended with 8 mm x 6cm LifeStar stent; no additional R Iliac PTA needed.   . ESOPHAGOGASTRODUODENOSCOPY (EGD) WITH PROPOFOL N/A 09/15/2017    Procedure: ESOPHAGOGASTRODUODENOSCOPY (EGD) WITH PROPOFOL;  Surgeon: WLucilla Lame MD;  [facility];  Service: Endoscopy;  Laterality: N/A;  . FRACTURE SURGERY Right 03/19/2015   wrist  . FRACTURE SURGERY Left   . HIATAL HERNIA REPAIR    . ILIAC ARTERY STENT  12/15/2005   PTA and direct stenting rgt and lft common iliac arteries  . KYPHOPLASTY N/A 04/05/2019   Procedure: KYPHOPLASTY T12, L3, L4 L5;  Surgeon: MHessie Knows MD;  Location: [facility] ORS;  Service: Orthopedics;  Laterality: N/A;  . NM GATED MYOVIEW ([place])  05/'19; 8/'20   a) [facility]: Low Risk - no ischemia or infarct.  EF > 65%. no RWMA;; b) ([facility]: Normal wall motion.  No ischemia or infarction.  . OPEN REDUCTION INTERNAL FIXATION (ORIF) DISTAL RADIAL FRACTURE Left 01/13/2019   Procedure:  OPEN REDUCTION INTERNAL FIXATION (ORIF) DISTAL  RADIAL FRACTURE - LEFT - SLEEP APNEA;  Surgeon: Hessie Knows, MD;  Location: [facility] ORS;  Service: Orthopedics;  Laterality: Left;  . ORIF ELBOW FRACTURE Right 01/01/2017   Procedure: OPEN REDUCTION INTERNAL FIXATION (ORIF) ELBOW/OLECRANON FRACTURE;  Surgeon: Hessie Knows, MD;  Location: [facility] ORS;  Service: Orthopedics;  Laterality: Right;  . ORIF WRIST FRACTURE Right 03/19/2015   Procedure: OPEN REDUCTION INTERNAL FIXATION (ORIF) WRIST FRACTURE;  Surgeon: Hessie Knows, MD;  Location: [facility] ORS;  Service: Orthopedics;  Laterality: Right;  . SHOULDER ARTHROSCOPY Right   . THORACIC AORTA - CAROTID ANGIOGRAM  October 2007   Dr. 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 TRANSTHORACIC ECHOCARDIOGRAM  February 2016   [facility]: Normal LV function. Dilated left atrium.  [facility] Dimes ECHOCARDIOGRAM  04/2019   [facility] April 20, 2019): EF 55-60 %.  Mild LVH.  Relatively normal valves.    There were no vitals filed for this visit.   Subjective Assessment - 04/19/20 0937    Subjective Patient's  LSVT referral recieved, no openings until October. No falls or LOB since last session, reports his back doesn't hurt today.    Pertinent History Patient is a pleasant 86 year old who was last seen by this PT on 12/22/19; since then has been admitted to the hospital and had other medical complications including: two surgeries for kidney stones, another one for his broken L foot. Was in a boot for 6 weeks (boot came off a week ago), and procedures for his back. Has been having hallucinations during the day and night time with increased rate frequently  Pain in back is associated with weakness, had facet joint injections. He has a history of kyphoplasty to T12, L3, L4, L5 performed in August 2020 by Dr. Rudene Christians. MRI from [facility] dated 03/28/2019 report was reviewed today. Acute compression fractures are noted at T12, L3, and L4. He has facet arthropathy at the lower lumbar levels. At L3-4 he has moderate central stenosis. PMH includes AAA, arthritis, atherosclerotic heart disease (s/p CABG), benign neoplasm of ascending/descending colon, COPD, CAD, dementia, dysrhythmia, falls, GERD, hemorrhoid, HOH, HTN, PAF, prostate cancer, osteoporosis, kidney stones, macular degeneration.    Limitations Walking;Standing;House hold activities;Other (comment);Lifting;Sitting    How long can you sit comfortably? 5 minutes    How long can you stand comfortably? needs a walker now, unsteady once standing    How long can you walk comfortably? uses a walker now; can walk in house.    Diagnostic tests [facility] from [facility] dated 03/28/2019 report was reviewed today. Acute compression fractures are noted at T12, L3, and L4. He has facet arthropathy at the lower lumbar levels. At L3-4 he has moderate central stenosis.    Patient Stated Goals decrease pain and improve balance.    Currently in Pain? No/denies            vitals at start of session 101/46    Treatment: Nustep Lvl 3 RPM > 50  for cardiovascular and musculoskeletal challenge  In // bars: cues for safety awareness, task orientation and sequencing with close CGA  -modified tandem stance on two different colored airex pads 2x 60 seconds each LE placement; occasional LOB  RTB side stepping 4x length of // bars with UE support   Large step clap then return to standing position, stepping over theraband for cue for foot placement. Max  cueing for sequencing 10x each LE  Static stand dumbbell curl 10x, terminated due to L arm pain  Forward and backwards walking with decreased UE support from BUE to SUE with cues for widening BOS x8 lengths of // bars  4lb ankle weights: High knee marches with BUE support 4x length of // bars, cues for upright posture Lateral kicks 10x each LE for simulation of martial arts  Seated marches 10x each LE, cues for upright posture   Pt educated throughout session about proper posture and technique with exercises. Improved exercise technique, movement at target joints, use of target muscles after min to mod verbal, visual, tactile cues                       PT Education - 04/19/20 0938    Education provided Yes    Education Details exercise technique, body mechanics    Person(s) Educated Patient    Methods Explanation;Demonstration;Tactile cues;Verbal cues    Comprehension Verbalized understanding;Returned demonstration;Verbal cues required;Tactile cues required            PT Short Term Goals - 04/17/20 1342      PT SHORT TERM GOAL #1   Title Patient will be independent in home exercise program to improve strength/mobility for better functional independence with ADLs.    Baseline 6/29: HEP given 8/17: HEP compliant with son    Time 4    Period Weeks    Status Partially Met    Target Date 03/27/20      PT SHORT TERM GOAL #2   Title Patient will perform 5 sit to stand transfers without UE support to increase functional mobility for carryover to community  activities.    Baseline 6/29; only able to perform 2 8/17: able to perform 4    Time 4    Period Weeks    Status Partially Met    Target Date 03/27/20             PT Long Term Goals - 04/17/20 0949      PT LONG TERM GOAL #1   Title Patient will increase FOTO score to equal to or greater than 54% to demonstrate statistically significant improvement in mobility and quality of life.    Baseline 6/29: 54% 8/17: 43.6%    Time 8    Period Weeks    Status On-going    Target Date 04/24/20      PT LONG TERM GOAL #2   Title Patient will demonstrate an improved Berg Balance Score of > 36 as to demonstrate improved balance with ADLs such as sitting/standing and transfer balance and reduced fall risk.    Baseline 6/29: 30/56 8/17: 37/56    Time 8    Period Weeks    Status Achieved    Target Date 04/24/20      PT LONG TERM GOAL #3   Title Patient will increase 10 meter walk test to >1.53ms as to improve gait speed for better community ambulation and to reduce fall risk.    Baseline 6/29: 0.573m with RW 8/17: >1.0 with rollator    Time 8    Period Weeks    Status Achieved      PT LONG TERM GOAL #4   Title Patient will increase BLE gross strength to 4+/5 as to improve functional strength for independent gait, increased standing tolerance and increased ADL ability.    Baseline 6/29: see note 8/17: see note    Time 8  Period Weeks    Status Partially Met    Target Date 04/24/20      PT LONG TERM GOAL #5   Title Patient will be able to perform household work/ chores without increase in symptoms.    Baseline 6/29: unable to perform 8/17: limited by balance and cognition as well as pain    Time 8    Period Weeks    Status Partially Met    Target Date 04/24/20                 Plan - 04/19/20 1006    Clinical Impression Statement Patient is challenged with prolonged standing requiring frequent rest breaks throughout session. Multistep interventions are challenging for  patient requiring max cueing for sequencing. Cognition and visual challenges are primarily limitation throughout session however patient continues to be motivated. He would cont to benefit from skilled PT to increase strength/stability for safer ADL performance, decreased fall risk, and increased functional ability.    Personal Factors and Comorbidities Age;Comorbidity 3+;Fitness;Past/Current Experience;Sex;Social Background;Time since onset of injury/illness/exacerbation;Transportation    Comorbidities AAA, arthritis, atherosclerotic heart disease, (s/p CABG), benign neoplasm of ascending/descending colon, COPD, CAD, dementia, dysrhythmia, frequent falls, GERD, hemorrhoid, HOH, HTN, PAF, prostate cancer.    Examination-Activity Limitations Bathing;Bend;Carry;Dressing;Continence;Stairs;Squat;Reach Overhead;Locomotion Level;Lift;Stand;Transfers;Toileting;Bed Mobility    Examination-Participation Restrictions Church;Cleaning;Community Activity;Driving;Interpersonal Relationship;Laundry;Volunteer;Shop;Personal Finances;Meal Prep;Yard Work    [employer] Evolving/Moderate complexity    Rehab Potential Fair    PT Frequency 2x / week    PT Duration 8 weeks    PT Treatment/Interventions ADLs/Self Care Home Management;Aquatic Therapy;Cryotherapy;Moist Heat;Traction;Ultrasound;Therapeutic activities;Functional mobility training;Stair training;Gait training;DME Instruction;Therapeutic exercise;Balance training;Neuromuscular re-education;Patient/family education;Manual techniques;Passive range of motion;Energy conservation;Vestibular;Taping;Biofeedback;Electrical Stimulation;Iontophoresis 25m/ml Dexamethasone;Dry needling;Visual/perceptual remediation/compensation    PT Next Visit Plan core stability, LE strength, balance    PT Home Exercise Plan see above    Consulted and Agree with Plan of Care Patient           Patient will benefit from skilled therapeutic intervention in order to  improve the following deficits and impairments:  Abnormal gait, Cardiopulmonary status limiting activity, Decreased activity tolerance, Decreased balance, Decreased knowledge of precautions, Decreased endurance, Decreased coordination, Decreased cognition, Decreased knowledge of use of DME, Decreased mobility, Difficulty walking, Decreased safety awareness, Decreased strength, Impaired flexibility, Impaired perceived functional ability, Impaired sensation, Postural dysfunction, Improper body mechanics, Pain, Impaired vision/preception, Decreased range of motion  Visit Diagnosis: Unsteadiness on feet  Muscle weakness (generalized)  Other abnormalities of gait and mobility  Chronic low back pain, unspecified back pain laterality, unspecified whether sciatica present     Problem List Patient Active Problem List   Diagnosis Date Noted  . Hematuria   . Acute kidney injury superimposed on CKD ([place]   . Sepsis due to gram-negative UTI ([place] 12/26/2019  . Left nephrolithiasis 12/26/2019  . Hydronephrosis, left 12/26/2019  . Status post abdominal aortic aneurysm (AAA) repair 04/15/2019  . Candidiasis 02/22/2019  . Malignant neoplasm of descending colon ([place]   . Benign neoplasm of descending colon   . Benign neoplasm of transverse colon   . Do not intubate, cardiopulmonary resuscitation (CPR)-only code status 10/08/2018  . Prostate cancer ([place] 02/25/2018  . Prostatic cyst 01/15/2018  . 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 ([place] 09/15/2017  . Blood in stool   . Abnormal feces   . Stricture and stenosis of esophagus   . Mild cognitive impairment 08/18/2017  . Senile purpura ([place] 04/29/2017  . Anemia 04/29/2017  . Frequent falls 04/29/2017  . Simple chronic bronchitis ([place]  12/17/2016  . Benign localized hyperplasia of prostate with urinary obstruction 07/15/2016  . Elevated PSA 07/15/2016  . Incomplete emptying of bladder 07/15/2016  . Urge incontinence 07/15/2016  . Hypothyroidism  04/25/2016  . Macular degeneration 04/25/2016  . Erectile dysfunction due to arterial insufficiency   . Ischemic heart disease with chronotropic incompetence   . CN (constipation) 02/10/2015  . DD (diverticular disease) 02/10/2015  . Weakness 02/10/2015  . Lichen planus 10/12/1550  . Restless leg 02/10/2015  . 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 B12 deficiency 02/10/2015  . COPD (chronic obstructive pulmonary disease) ([place]) 11/14/2014  . Post Inflammatory Lung Changes 11/14/2014  . Acute delirium 11/08/2014  . PAF (paroxysmal atrial fibrillation) (HCC) CHA2DS2-VASc = 4. AC = Eliquis   . Insomnia 12/09/2013  . OSA on CPAP   . Dyspnea on exertion - -essentially resolved with BB dose reduction & wgt loss 03/29/2013  . Overweight (BMI 25.0-29.9) -- Wgt back up 01/17/2013  . Atherosclerotic heart disease of native coronary artery without angina pectoris - s/p CABG, occluded SVG-D1; patent LIMA-LAD, SVG-OM, SVG- RCA   . Essential hypertension   . Hyperlipidemia with target LDL less than 70   . Aorto-iliac disease ([place])   . BCC (basal cell carcinoma), eyelid 01/03/2013  . Allergic rhinitis 08/17/2009  . Adaptation reaction 05/28/2009  . Acid reflux 05/28/2009  . Arthritis, degenerative 05/28/2009  . 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 Carotid artery occlusion without infarction 06/01/2006  . PAD (peripheral artery disease) - bilateral common iliac stents 12/15/2005  . Atherosclerotic heart disease of artery bypass graft 09/01/1997   Janna Arch, PT, DPT   04/19/2020, 10:14 AM  [place] MAIN [facility] SERVICES [address] [place], [place], [address] Phone: [phone]   Fax:  [phone]  Name: [name] MRN: [id number] Date of Birth: 1933-09-05

## 2020-04-24 ENCOUNTER — Other Ambulatory Visit: Payer: Self-pay

## 2020-04-24 ENCOUNTER — Ambulatory Visit: Payer: Medicare Other

## 2020-04-24 DIAGNOSIS — R2681 Unsteadiness on feet: Secondary | ICD-10-CM

## 2020-04-24 DIAGNOSIS — M545 Low back pain, unspecified: Secondary | ICD-10-CM

## 2020-04-24 DIAGNOSIS — G8929 Other chronic pain: Secondary | ICD-10-CM

## 2020-04-24 DIAGNOSIS — R2689 Other abnormalities of gait and mobility: Secondary | ICD-10-CM

## 2020-04-24 DIAGNOSIS — M6281 Muscle weakness (generalized): Secondary | ICD-10-CM

## 2020-04-24 NOTE — Therapy (Signed)
[place] [facility] SERVICES [address] [place], [place], [address] Phone: [phone]   Fax:  [phone]  Physical Therapy Treatment/RECERT  Patient Details  Name: [name] MRN: [id number] Date of Birth: [birth year] Referring Provider (PT): Meeler, Sherren Kerns FNP   Encounter Date: 04/24/2020   PT End of Session - 04/24/20 1301    Visit Number 12    Number of Visits 28    Date for PT Re-Evaluation 06/19/20    Authorization Type 2/10 PN 04/17/20    PT Start Time 0933    PT Stop Time 1013    PT Time Calculation (min) 40 min    Equipment Utilized During Treatment Gait belt    Activity Tolerance Patient tolerated treatment well;Patient limited by fatigue    Behavior During Therapy [facility] for tasks assessed/performed           Past Medical History:  Diagnosis Date   AAA (abdominal aortic aneurysm) ([place]) 05/2019   [place] Rapid progression from June-Aug 2020 (4.6 - 5.4 cm): s/p EVAR (Dr. Lucky Cowboy [facility]): EVAR - 23 mm prox, 12 cm distal & x 18 cm length Gore Excluder Endoprosthesis Main body (via RFA distal to lowest Renal A); 37m x 14 cm L Contralateral Limb for L Iliac - extended with 8 mm x 6cm LifeStar stent (post-dilated with 7 mm DEB).  Additional R Iliac PTA not required.    Arthritis    Basal cell carcinoma of eyelid 01/03/2013   2019 - R side of Nose   Benign neoplasm of ascending colon    Benign neoplasm of descending colon    Bilateral iliac artery stenosis ([place] 2007   Bilateral Iliac A stenting; extension of EVAR limbs into both Iliacs -- additional stent placed in L Iliac.   Cancer ([facility]    Skin cancer   Colon polyps    Complication of anesthesia    severe confusion agitation requiring hospital admission x 4 days   Compression fracture of fourth lumbar vertebra ([place] 04/2019   - s/p Kyphoplasty; T12, L3-5 (*[facility])   COPD (chronic obstructive pulmonary disease) (HCC)    Centrilobular Emphysema   Coronary  artery disease involving native coronary artery without angina pectoris 1998   - s/p CABG, occluded SVG-D1; patent LIMA-LAD, SVG-OM, SVG- RCA   Dementia ([place]    memory loss - started noting late 2019   Falls frequently    broke foot   Fracture 2021   left foot   GERD (gastroesophageal reflux disease)    Hemorrhoid 02/10/2015   History of hiatal hernia    History of kidney stones    HOH (hard of hearing)    Hypertension    Lewy body dementia ([place] 04/04/2020   Macular degeneration disease    Osteoporosis    PAF (paroxysmal atrial fibrillation) (HCC)    on Eliquis for OAC - Rate Control    Prostate cancer (HCC)    Prostate disorder    Sciatic pain    Chronic   Sleep apnea    cpap   Temporary cerebral vascular dysfunction 02/10/2015   Had negative work-up.  July, 2012.  No medication changes, had forgot them that day, got dehydrated.    Urinary incontinence     Past Surgical History:  Procedure Laterality Date   ABDOMINAL AORTIC ENDOVASCULAR STENT GRAFT Bilateral 05/18/2019   Procedure: ABDOMINAL AORTIC ENDOVASCULAR STENT GRAFT;  Surgeon: DAlgernon Huxley MD;  Location: [facility] ORS;  Service: Vascular;  Laterality: Bilateral;  ANKLE SURGERY Left    ORIF    Arch aortogram and carotid aortogram  06/02/2006   Dr Oneida Alar did surgery   BASAL CELL CARCINOMA EXCISION  04/2016   Dermatology   CARDIAC CATHETERIZATION  01/30/1998    (Dr. Linard Millers): Native LAD & mRCA CTO. 90% D1. Mod Cx. SVG-D1 CTO. SVG-RCA & SVG-OM along with LIMA-LAD patent;  LV NORMAL Fxn   CAROTID ENDARTERECTOMY Left Oct. 29, 2007   Dr. Oneida Alar:   CATARACT EXTRACTION W/PHACO Right 03/05/2017   Procedure: CATARACT EXTRACTION PHACO AND INTRAOCULAR LENS PLACEMENT ([place]);  Surgeon: Leandrew Koyanagi, MD;  Location: [facility] ORS;  Service: Ophthalmology;  Laterality: Right;  [place] 00:58.5 AP% 10.6 CDE 6.20 Fluid Pack lot # [id number]   CATARACT EXTRACTION W/PHACO Left 04/15/2017   Procedure: CATARACT  EXTRACTION PHACO AND INTRAOCULAR LENS PLACEMENT ([place])  Left;  Surgeon: Leandrew Koyanagi, MD;  Location: [facility];  Service: Ophthalmology;  Laterality: Left;   COLONOSCOPY WITH PROPOFOL N/A 09/15/2017   Procedure: COLONOSCOPY WITH PROPOFOL;  Surgeon: Lucilla Lame, MD;  Location: [facility] ENDOSCOPY;  Service: Endoscopy;  Laterality: N/A;   COLONOSCOPY WITH PROPOFOL N/A 10/26/2018   Procedure: COLONOSCOPY WITH PROPOFOL;  Surgeon: Lucilla Lame, MD;  Location: [facility] ENDOSCOPY;  Service: Endoscopy;  Laterality: N/A;   CORONARY ARTERY BYPASS GRAFT  1998   LIMA-LAD, SVG-OM, SVG-RPDA, SVG-DIAG   CPET / MET  12/2012   Mild chronotropic incompetence - read 82% of predicted; also reduced effort; peak VO2 15.7 / 75% (did not reach Max effort) -- suggested ischemic response in last 1.5 minutes of exercise. Normal pulmonary function on PFTs but poor response to   CYSTOSCOPY W/ RETROGRADES Left 01/13/2020   Procedure: CYSTOSCOPY WITH RETROGRADE PYELOGRAM;  Surgeon: Billey Co, MD;  Location: [facility] ORS;  Service: Urology;  Laterality: Left;   CYSTOSCOPY WITH STENT PLACEMENT Left 12/26/2019   Procedure: CYSTOSCOPY WITH STENT PLACEMENT;  Surgeon: Billey Co, MD;  Location: [facility] ORS;  Service: Urology;  Laterality: Left;   CYSTOSCOPY/URETEROSCOPY/HOLMIUM LASER/STENT PLACEMENT Left 01/13/2020   Procedure: CYSTOSCOPY/URETEROSCOPY/HOLMIUM LASER/STENT EXCHANGE;  Surgeon: Billey Co, MD;  Location: [facility] ORS;  Service: Urology;  Laterality: Left;   ENDOVASCULAR REPAIR/STENT GRAFT N/A 05/18/2019   Procedure: ENDOVASCULAR REPAIR/STENT GRAFT;  Surgeon: Algernon Huxley, MD;  Location: [facility] INVASIVE CV LAB;; EVAR - 23 mm prox, 12 cm distal & x 18 cm length Gore Excluder Endoprosthesis Main body (via RFA distal to lowest Renal A); 36m x 14 cm L Contralateral Limb for L Iliac - extended with 8 mm x 6cm LifeStar stent; no additional R Iliac PTA needed.    ESOPHAGOGASTRODUODENOSCOPY (EGD) WITH PROPOFOL N/A  09/15/2017   Procedure: ESOPHAGOGASTRODUODENOSCOPY (EGD) WITH PROPOFOL;  Surgeon: WLucilla Lame MD;  [facility];  Service: Endoscopy;  Laterality: N/A;   FRACTURE SURGERY Right 03/19/2015   wrist   FRACTURE SURGERY Left    HIATAL HERNIA REPAIR     ILIAC ARTERY STENT  12/15/2005   PTA and direct stenting rgt and lft common iliac arteries   KYPHOPLASTY N/A 04/05/2019   Procedure: KYPHOPLASTY T12, L3, L4 L5;  Surgeon: MHessie Knows MD;  Location: [facility] ORS;  Service: Orthopedics;  Laterality: N/A;   NM GATED MYOVIEW ([place])  05/'19; 8/'20   a) [facility]: Low Risk - no ischemia or infarct.  EF > 65%. no RWMA;; b) ([facility]: Normal wall motion.  No ischemia or infarction.   OPEN REDUCTION INTERNAL FIXATION (ORIF) DISTAL RADIAL FRACTURE Left 01/13/2019   Procedure:  OPEN REDUCTION INTERNAL FIXATION (ORIF) DISTAL  RADIAL FRACTURE - LEFT - SLEEP APNEA;  Surgeon: Hessie Knows, MD;  Location: [facility] ORS;  Service: Orthopedics;  Laterality: Left;   ORIF ELBOW FRACTURE Right 01/01/2017   Procedure: OPEN REDUCTION INTERNAL FIXATION (ORIF) ELBOW/OLECRANON FRACTURE;  Surgeon: Hessie Knows, MD;  Location: [facility] ORS;  Service: Orthopedics;  Laterality: Right;   ORIF WRIST FRACTURE Right 03/19/2015   Procedure: OPEN REDUCTION INTERNAL FIXATION (ORIF) WRIST FRACTURE;  Surgeon: Hessie Knows, MD;  Location: [facility] ORS;  Service: Orthopedics;  Laterality: Right;   SHOULDER ARTHROSCOPY Right    THORACIC AORTA - CAROTID ANGIOGRAM  October 2007   Dr. 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 Dilated left atrium.   TRANSTHORACIC ECHOCARDIOGRAM  04/2019   [facility] April 20, 2019): EF 55-60 %.  Mild LVH.  Relatively normal valves.    There were no vitals filed for this visit.   Subjective Assessment - 04/24/20 0954    Subjective  Patient presents with increased altered mental status/confusion with caregiver. Reports no falls since last session.    Pertinent History Patient is a pleasant 86 year old who was last seen by this PT on 12/22/19; since then has been admitted to the hospital and had other medical complications including: two surgeries for kidney stones, another one for his broken L foot. Was in a boot for 6 weeks (boot came off a week ago), and procedures for his back. Has been having hallucinations during the day and night time with increased rate frequently  Pain in back is associated with weakness, had facet joint injections. He has a history of kyphoplasty to T12, L3, L4, L5 performed in August 2020 by Dr. Rudene Christians. MRI from [facility] dated 03/28/2019 report was reviewed today. Acute compression fractures are noted at T12, L3, and L4. He has facet arthropathy at the lower lumbar levels. At L3-4 he has moderate central stenosis. PMH includes AAA, arthritis, atherosclerotic heart disease (s/p CABG), benign neoplasm of ascending/descending colon, COPD, CAD, dementia, dysrhythmia, falls, GERD, hemorrhoid, HOH, HTN, PAF, prostate cancer, osteoporosis, kidney stones, macular degeneration.    Limitations Walking;Standing;House hold activities;Other (comment);Lifting;Sitting    How long can you sit comfortably? 5 minutes    How long can you stand comfortably? needs a walker now, unsteady once standing    How long can you walk comfortably? uses a walker now; can walk in house.    Diagnostic tests MRI from [facility] dated 03/28/2019 report was reviewed today. Acute compression fractures are noted at T12, L3, and L4. He has facet arthropathy at the lower lumbar levels. At L3-4 he has moderate central stenosis.    Patient Stated Goals decrease pain and improve balance.    Currently in Pain? No/denies              Recert Goals performed 2 sessions prior on 04/17/20    vitals at start of  session 109/56    Treatment: Nustep Lvl 3 RPM > 50 for cardiovascular and musculoskeletal challenge   In // bars: cues for safety awareness, task orientation and sequencing with close CGA  -modified tandem stance on two different colored airex pads 2x 60 seconds each LE placement; occasional LOB   RTB side stepping 4x length of // bars with UE support    Large step clap then return to standing  position, stepping over theraband for cue for foot placement. Max cueing for sequencing 10x each LE     Forward and backwards walking with decreased UE support from BUE to SUE with cues for widening BOS x8 lengths of // bars   4lb ankle weights: High knee marches with BUE support 4x length of // bars, cues for upright posture Lateral kicks 10x each LE for simulation of martial arts  Seated marches 10x each LE, cues for upright posture    Seated: GTB hamstring curl 15x each LE GTB adduction to PT hand 15x each LE STS with decreased UE support 10x  Pt educated throughout session about proper posture and technique with exercises. Improved exercise technique, movement at target joints, use of target muscles after min to mod verbal, visual, tactile cues                          PT Education - 04/24/20 1257    Education provided Yes    Education Details exercise technique, body mechanics    Person(s) Educated Patient    Methods Explanation;Demonstration;Tactile cues;Verbal cues    Comprehension Verbalized understanding;Returned demonstration;Verbal cues required;Tactile cues required            PT Short Term Goals - 04/24/20 1304      PT SHORT TERM GOAL #1   Title Patient will be independent in home exercise program to improve strength/mobility for better functional independence with ADLs.    Baseline 6/29: HEP given 8/17: HEP compliant with son 8/24: compliant with son    Time 4    Period Weeks    Status Achieved    Target Date 03/27/20      PT SHORT TERM GOAL #2    Title Patient will perform 5 sit to stand transfers without UE support to increase functional mobility for carryover to community activities.    Baseline 6/29; only able to perform 2 8/17: able to perform 4    Time 4    Period Weeks    Status Partially Met    Target Date 05/22/20             PT Long Term Goals - 04/24/20 1305      PT LONG TERM GOAL #1   Title Patient will increase FOTO score to equal to or greater than 54% to demonstrate statistically significant improvement in mobility and quality of life.    Baseline 6/29: 54% 8/17: 43.6%    Time 8    Period Weeks    Status On-going    Target Date 06/19/20      PT LONG TERM GOAL #2   Title Patient will demonstrate an improved Berg Balance Score of > 36 as to demonstrate improved balance with ADLs such as sitting/standing and transfer balance and reduced fall risk.    Baseline 6/29: 30/56 8/17: 37/56    Time 8    Period Weeks    Status Achieved      PT LONG TERM GOAL #3   Title Patient will increase 10 meter walk test to >1.48ms as to improve gait speed for better community ambulation and to reduce fall risk.    Baseline 6/29: 0.579m with RW 8/17: >1.0 with rollator    Time 8    Period Weeks    Status Achieved      PT LONG TERM GOAL #4   Title Patient will increase BLE gross strength to 4+/5 as to improve functional strength for  independent gait, increased standing tolerance and increased ADL ability.    Baseline 6/29: see note 8/17: see note    Time 8    Period Weeks    Status Partially Met    Target Date 06/19/20      PT LONG TERM GOAL #5   Title Patient will be able to perform household work/ chores without increase in symptoms.    Baseline 6/29: unable to perform 8/17: limited by balance and cognition as well as pain    Time 8    Period Weeks    Status Partially Met    Target Date 06/19/20                 Plan - 04/24/20 1302    Clinical Impression Statement Patient's goals performed performed 2  sessions prior on 04/17/20; please refer to this note for further details on goal progression. Patient is more confused this session requiring more frequent cueing due to altered mental status. His recent increase in falling is in partial potentially related to his recent cognitive decline with decreased safety awareness and decreased central visual field of R eye. Patient's condition has the potential to improve in response to therapy. Patient would benefit from further skilled PT intervention to decrease pain and increase strength and stability for safer ADL performance and increased functional ability    Personal Factors and Comorbidities Age;Comorbidity 3+;Fitness;Past/Current Experience;Sex;Social Background;Time since onset of injury/illness/exacerbation;Transportation    Comorbidities AAA, arthritis, atherosclerotic heart disease, (s/p CABG), benign neoplasm of ascending/descending colon, COPD, CAD, dementia, dysrhythmia, frequent falls, GERD, hemorrhoid, HOH, HTN, PAF, prostate cancer.    Examination-Activity Limitations Bathing;Bend;Carry;Dressing;Continence;Stairs;Squat;Reach Overhead;Locomotion Level;Lift;Stand;Transfers;Toileting;Bed Mobility    Examination-Participation Restrictions Church;Cleaning;Community Activity;Driving;Interpersonal Relationship;Laundry;Volunteer;Shop;Personal Finances;Meal Prep;Yard Work    [employer] Evolving/Moderate complexity    Rehab Potential Fair    PT Frequency 2x / week    PT Duration 8 weeks    PT Treatment/Interventions ADLs/Self Care Home Management;Aquatic Therapy;Cryotherapy;Moist Heat;Traction;Ultrasound;Therapeutic activities;Functional mobility training;Stair training;Gait training;DME Instruction;Therapeutic exercise;Balance training;Neuromuscular re-education;Patient/family education;Manual techniques;Passive range of motion;Energy conservation;Vestibular;Taping;Biofeedback;Electrical Stimulation;Iontophoresis 41m/ml  Dexamethasone;Dry needling;Visual/perceptual remediation/compensation    PT Next Visit Plan core stability, LE strength, balance    PT Home Exercise Plan see above    Consulted and Agree with Plan of Care Patient           Patient will benefit from skilled therapeutic intervention in order to improve the following deficits and impairments:  Abnormal gait, Cardiopulmonary status limiting activity, Decreased activity tolerance, Decreased balance, Decreased knowledge of precautions, Decreased endurance, Decreased coordination, Decreased cognition, Decreased knowledge of use of DME, Decreased mobility, Difficulty walking, Decreased safety awareness, Decreased strength, Impaired flexibility, Impaired perceived functional ability, Impaired sensation, Postural dysfunction, Improper body mechanics, Pain, Impaired vision/preception, Decreased range of motion  Visit Diagnosis: Unsteadiness on feet  Muscle weakness (generalized)  Other abnormalities of gait and mobility  Chronic low back pain, unspecified back pain laterality, unspecified whether sciatica present     Problem List Patient Active Problem List   Diagnosis Date Noted   Hematuria    Acute kidney injury superimposed on CKD ([place]    Sepsis due to gram-negative UTI ([place] 12/26/2019   Left nephrolithiasis 12/26/2019   Hydronephrosis, left 12/26/2019   Status post abdominal aortic aneurysm (AAA) repair 04/15/2019   Candidiasis 02/22/2019   Malignant neoplasm of descending colon (HCC)    Benign neoplasm of descending colon    Benign neoplasm of transverse colon    Do not intubate, cardiopulmonary resuscitation (CPR)-only code status 10/08/2018  Prostate cancer ([place]) 02/25/2018   Prostatic cyst 01/15/2018   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 ([place]) 09/15/2017   Blood in stool    Abnormal feces    Stricture and stenosis of esophagus    Mild cognitive impairment 08/18/2017   Senile purpura ([place]) 04/29/2017    Anemia 04/29/2017   Frequent falls 04/29/2017   Simple chronic bronchitis ([employer]) 12/17/2016   Benign localized hyperplasia of prostate with urinary obstruction 07/15/2016   Elevated PSA 07/15/2016   Incomplete emptying of bladder 07/15/2016   Urge incontinence 07/15/2016   Hypothyroidism 04/25/2016   Macular degeneration 04/25/2016   Erectile dysfunction due to arterial insufficiency    Ischemic heart disease with chronotropic incompetence    CN (constipation) 02/10/2015   DD (diverticular disease) 02/10/2015   Weakness 91/99/5790   Lichen planus 05/21/414   Restless leg 02/10/2015   Circadian rhythm disorder 02/10/2015   B12 deficiency 02/10/2015   COPD (chronic obstructive pulmonary disease) ([place]) 11/14/2014   Post Inflammatory Lung Changes 11/14/2014   Acute delirium 11/08/2014   PAF (paroxysmal atrial fibrillation) (HCC) CHA2DS2-VASc = 4. AC = Eliquis    Insomnia 12/09/2013   OSA on CPAP    Dyspnea on exertion - -essentially resolved with BB dose reduction & wgt loss 03/29/2013   Overweight (BMI 25.0-29.9) -- Wgt back up 01/17/2013   Atherosclerotic heart disease of native coronary artery without angina pectoris - s/p CABG, occluded SVG-D1; patent LIMA-LAD, SVG-OM, SVG- RCA    Essential hypertension    Hyperlipidemia with target LDL less than 70    Aorto-iliac disease ([place])    BCC (basal cell carcinoma), eyelid 01/03/2013   Allergic rhinitis 08/17/2009   Adaptation reaction 05/28/2009   Acid reflux 05/28/2009   Arthritis, degenerative 05/28/2009   Abnormality of aortic arch branch 06/01/2006   Carotid artery occlusion without infarction 06/01/2006   PAD (peripheral artery disease) - bilateral common iliac stents 12/15/2005   Atherosclerotic heart disease of artery bypass graft 09/01/1997   Janna Arch, PT, DPT   04/24/2020, 1:06 PM  [facility]IN [facility] SERVICES [address]  [place], [place], [address] Phone: [phone]   Fax:  [phone]  Name: [name] MRN: [id number] Date of Birth: [birth year]

## 2020-04-25 ENCOUNTER — Encounter: Payer: Medicare Other | Attending: Internal Medicine | Admitting: Internal Medicine

## 2020-04-25 ENCOUNTER — Other Ambulatory Visit: Payer: Self-pay

## 2020-04-25 DIAGNOSIS — I2510 Atherosclerotic heart disease of native coronary artery without angina pectoris: Secondary | ICD-10-CM | POA: Insufficient documentation

## 2020-04-25 DIAGNOSIS — Z87891 Personal history of nicotine dependence: Secondary | ICD-10-CM | POA: Diagnosis not present

## 2020-04-25 DIAGNOSIS — Z8249 Family history of ischemic heart disease and other diseases of the circulatory system: Secondary | ICD-10-CM | POA: Diagnosis not present

## 2020-04-25 DIAGNOSIS — I10 Essential (primary) hypertension: Secondary | ICD-10-CM | POA: Insufficient documentation

## 2020-04-25 DIAGNOSIS — L98499 Non-pressure chronic ulcer of skin of other sites with unspecified severity: Secondary | ICD-10-CM | POA: Diagnosis present

## 2020-04-25 DIAGNOSIS — G3183 Dementia with Lewy bodies: Secondary | ICD-10-CM | POA: Diagnosis not present

## 2020-04-25 DIAGNOSIS — X58XXXA Exposure to other specified factors, initial encounter: Secondary | ICD-10-CM | POA: Insufficient documentation

## 2020-04-25 DIAGNOSIS — Z87442 Personal history of urinary calculi: Secondary | ICD-10-CM | POA: Diagnosis not present

## 2020-04-25 DIAGNOSIS — S40812A Abrasion of left upper arm, initial encounter: Secondary | ICD-10-CM | POA: Diagnosis not present

## 2020-04-25 DIAGNOSIS — R269 Unspecified abnormalities of gait and mobility: Secondary | ICD-10-CM | POA: Insufficient documentation

## 2020-04-25 DIAGNOSIS — L98498 Non-pressure chronic ulcer of skin of other sites with other specified severity: Secondary | ICD-10-CM | POA: Insufficient documentation

## 2020-04-25 DIAGNOSIS — F0280 Dementia in other diseases classified elsewhere without behavioral disturbance: Secondary | ICD-10-CM | POA: Diagnosis not present

## 2020-04-26 ENCOUNTER — Ambulatory Visit: Payer: Medicare Other

## 2020-04-26 DIAGNOSIS — R2681 Unsteadiness on feet: Secondary | ICD-10-CM

## 2020-04-26 DIAGNOSIS — G8929 Other chronic pain: Secondary | ICD-10-CM

## 2020-04-26 DIAGNOSIS — M545 Low back pain, unspecified: Secondary | ICD-10-CM

## 2020-04-26 DIAGNOSIS — R2689 Other abnormalities of gait and mobility: Secondary | ICD-10-CM

## 2020-04-26 DIAGNOSIS — M6281 Muscle weakness (generalized): Secondary | ICD-10-CM

## 2020-04-26 NOTE — Progress Notes (Signed)
[name] ([id number]) Visit Report for 04/25/2020 Abuse/Suicide Risk Screen Details Patient Name: [name], [name] Date of Service: 04/25/2020 10:30 AM Medical Record Number: [id number] Patient Account Number: [id number] Date of Birth/Sex: 02/17/1934 (86 y.o. M) Treating RN: Cornell Barman Primary Care Everlynn Sagun: Lelon Huh Other Clinician: Referring Courtney Fenlon: Thersa Salt Treating Rogan Ecklund/Extender: Tito Dine in Treatment: 0 Abuse/Suicide Risk Screen Items Answer ABUSE RISK SCREEN: Has anyone close to you tried to hurt or harm you recentlyo No Do you feel uncomfortable with anyone in your familyo No Has anyone forced you do things that you didnot want to doo No Electronic Signature(s) Signed: 04/25/2020 1:27:38 PM By: Darci Needle Signed: 04/25/2020 5:45:00 PM By: Gretta Cool, BSN, RN, CWS, Kim RN, BSN Entered By: Darci Needle on 04/25/2020 10:49:31 [name] ([id number]) -------------------------------------------------------------------------------- Activities of Daily Living Details Patient Name: [name] Date of Service: 04/25/2020 10:30 AM Medical Record Number: [id number] Patient Account Number: [id number] Date of Birth/Sex: 04-20-1934 (86 y.o. M) Treating RN: Cornell Barman Primary Care Alsace Dowd: Lelon Huh Other Clinician: Referring Alayshia Marini: Thersa Salt Treating Samir Ishaq/Extender: Tito Dine in Treatment: 0 Activities of Daily Living Items Answer Activities of Daily Living (Please select one for each item) Drive Automobile Not Able Take Medications Need Assistance Use Telephone Need Assistance Care for Appearance Need Assistance Use Toilet Need Assistance Bath / Shower Need Assistance Dress Self Need Assistance Feed Self Completely Able Walk Completely Able Get In / Out Bed Need Assistance Housework Not Able Prepare Meals Need Assistance Handle Money Not Able Shop for Self Not Able Electronic  Signature(s) Signed: 04/25/2020 1:27:38 PM By: Darci Needle Signed: 04/25/2020 5:45:00 PM By: Gretta Cool, BSN, RN, CWS, Kim RN, BSN Entered By: Darci Needle on 04/25/2020 10:51:49 [name] ([id number]) -------------------------------------------------------------------------------- Education Screening Details Patient Name: [name] Date of Service: 04/25/2020 10:30 AM Medical Record Number: [id number] Patient Account Number: [id number] Date of Birth/Sex: 03-22-1934 (86 y.o. M) Treating RN: Cornell Barman Primary Care Jaquana Geiger: Lelon Huh Other Clinician: Referring Vermelle Cammarata: Thersa Salt Treating Jozalynn Noyce/Extender: Tito Dine in Treatment: 0 Primary Learner Assessed: Caregiver Learning Preferences/Education Level/Primary Language Learning Preference: Explanation Highest Education Level: College or Above Preferred Language: English Cognitive Barrier Language Barrier: No Translator Needed: No Memory Deficit: Yes Emotional Barrier: No Cultural/Religious Beliefs Affecting Medical Care: No Physical Barrier Impaired Vision: Yes Glasses Impaired Hearing: No Decreased Hand dexterity: No Knowledge/Comprehension Knowledge Level: Medium Comprehension Level: Medium Ability to understand written instructions: Low Ability to understand verbal instructions: Low Motivation Anxiety Level: Calm Cooperation: Cooperative Education Importance: Denies Need Interest in Health Problems: Uninterested Perception: Coherent Willingness to Engage in Self-Management Low Activities: Readiness to Engage in Self-Management Low Activities: Electronic Signature(s) Signed: 04/25/2020 1:27:38 PM By: Darci Needle Signed: 04/25/2020 5:45:00 PM By: Gretta Cool, BSN, RN, CWS, Kim RN, BSN Entered By: Darci Needle on 04/25/2020 10:53:01 [name] ([id number]) -------------------------------------------------------------------------------- Fall Risk Assessment  Details Patient Name: [name] Date of Service: 04/25/2020 10:30 AM Medical Record Number: [id number] Patient Account Number: [id number] Date of Birth/Sex: August 25, 1934 (86 y.o. M) Treating RN: Cornell Barman Primary Care Shaleigh Laubscher: Lelon Huh Other Clinician: Referring Jamyria Ozanich: Thersa Salt Treating Brayen Bunn/Extender: Tito Dine in Treatment: 0 Fall Risk Assessment Items Have you had 2 or more falls in the last 12 monthso 0 Yes Have you had any fall that resulted in injury in the last 12 monthso 0 Yes FALLS RISK SCREEN History of falling - immediate or within 3 months 25 Yes Secondary diagnosis (Do you  have 2 or more medical diagnoseso) 15 Yes Ambulatory aid None/bed rest/wheelchair/nurse 0 No Crutches/cane/walker 0 No Furniture 0 No Intravenous therapy Access/Saline/Heparin Lock 0 No Gait/Transferring Normal/ bed rest/ wheelchair 0 No Weak (short steps with or without shuffle, stooped but able to lift head while walking, may 10 Yes seek support from furniture) Impaired (short steps with shuffle, may have difficulty arising from chair, head down, impaired 0 No balance) Mental Status Oriented to own ability 0 No Electronic Signature(s) Signed: 04/25/2020 1:27:38 PM By: Darci Needle Signed: 04/25/2020 5:45:00 PM By: Gretta Cool, BSN, RN, CWS, Kim RN, BSN Entered By: Darci Needle on 04/25/2020 10:53:40 [name] ([id number]) -------------------------------------------------------------------------------- Foot Assessment Details Patient Name: [name] Date of Service: 04/25/2020 10:30 AM Medical Record Number: [id number] Patient Account Number: [id number] Date of Birth/Sex: [birth year] (86 y.o. M) Treating RN: Cornell Barman Primary Care Suhey Radford: Lelon Huh Other Clinician: Referring Janee Ureste: Thersa Salt Treating Vergie Zahm/Extender: Tito Dine in Treatment: 0 Foot Assessment Items Site Locations + = Sensation present, - =  Sensation absent, C = Callus, U = Ulcer R = Redness, W = Warmth, M = Maceration, PU = Pre-ulcerative lesion F = Fissure, S = Swelling, D = Dryness Assessment Right: Left: Other Deformity: No No Prior Foot Ulcer: No No Prior Amputation: No No Charcot Joint: No No Ambulatory Status: Gait: Electronic Signature(s) Signed: 04/25/2020 1:27:38 PM By: Darci Needle Signed: 04/25/2020 5:45:00 PM By: Gretta Cool, BSN, RN, CWS, Kim RN, BSN Entered By: Darci Needle on 04/25/2020 10:54:39 [name] ([id number]) -------------------------------------------------------------------------------- Nutrition Risk Screening Details Patient Name: [name] Date of Service: 04/25/2020 10:30 AM Medical Record Number: [id number] Patient Account Number: [id number] Date of Birth/Sex: 1934-05-10 (86 y.o. M) Treating RN: Cornell Barman Primary Care Siraj Dermody: Lelon Huh Other Clinician: Referring Iara Monds: Thersa Salt Treating Ruhee Enck/Extender: Tito Dine in Treatment: 0 Height (in): 70 Weight (lbs): 182 Body Mass Index (BMI): 26.1 Nutrition Risk Screening Items Score Screening NUTRITION RISK SCREEN: I have an illness or condition that made me change the kind and/or amount of food I eat 0 No I eat fewer than two meals per day 0 No I eat few fruits and vegetables, or milk products 0 No I have three or more drinks of beer, liquor or wine almost every day 0 No I have tooth or mouth problems that make it hard for me to eat 0 No I don't always have enough money to buy the food I need 0 No I eat alone most of the time 0 No I take three or more different prescribed or over-the-counter drugs a day 1 Yes Without wanting to, I have lost or gained 10 pounds in the last six months 2 Yes I am not always physically able to shop, cook and/or feed myself 0 No Nutrition Protocols Good Risk Protocol Moderate Risk Protocol High Risk Proctocol Risk Level: Moderate Risk Score: 3 Electronic  Signature(s) Signed: 04/25/2020 1:27:38 PM By: Darci Needle Signed: 04/25/2020 5:45:00 PM By: Gretta Cool, BSN, RN, CWS, Kim RN, BSN Entered By: Darci Needle on 04/25/2020 10:54:31

## 2020-04-26 NOTE — Therapy (Signed)
[place] MAIN [facility] SERVICES [address] [place], [place], [address] Phone: [phone]   Fax:  [phone]  Physical Therapy Treatment  Patient Details  Name: [name] MRN: [id number] Date of Birth: 1933-11-20 Referring Provider (PT): Meeler, Sherren Kerns FNP   Encounter Date: 04/26/2020   PT End of Session - 04/26/20 0937    Visit Number 13    Number of Visits 28    Date for PT Re-Evaluation 06/19/20    Authorization Type 3/10 PN 04/17/20    PT Start Time 0930    PT Stop Time 1013    PT Time Calculation (min) 43 min    Equipment Utilized During Treatment Gait belt    Activity Tolerance Patient tolerated treatment well;Patient limited by fatigue    Behavior During Therapy [facility] for tasks assessed/performed           Past Medical History:  Diagnosis Date  . AAA (abdominal aortic aneurysm) ([place]) 05/2019   [place] Rapid progression from June-Aug 2020 (4.6 - 5.4 cm): s/p EVAR (Dr. Lucky Cowboy [facility]): EVAR - 23 mm prox, 12 cm distal & x 18 cm length Gore Excluder Endoprosthesis Main body (via RFA distal to lowest Renal A); 73mm x 14 cm L Contralateral Limb for L Iliac - extended with 8 mm x 6cm LifeStar stent (post-dilated with 7 mm DEB).  Additional R Iliac PTA not required.   . Arthritis   . Basal cell carcinoma of eyelid 01/03/2013   2019 - R side of Nose  . Benign neoplasm of ascending colon   . Benign neoplasm of descending colon   . Bilateral iliac artery stenosis ([place]) 2007   Bilateral Iliac A stenting; extension of EVAR limbs into both Iliacs -- additional stent placed in L Iliac.  . Cancer ([place])    Skin cancer  . Colon polyps   . Complication of anesthesia    severe confusion agitation requiring hospital admission x 4 days  . Compression fracture of fourth lumbar vertebra ([place]) 04/2019   - s/p Kyphoplasty; T12, L3-5 [facility])  . COPD (chronic obstructive pulmonary disease) (HCC)    Centrilobular Emphysema  . Coronary artery  disease involving native coronary artery without angina pectoris 1998   - s/p CABG, occluded SVG-D1; patent LIMA-LAD, SVG-OM, SVG- RCA  . Dementia ([place])    memory loss - started noting late 2019  . Falls frequently    broke foot  . Fracture 2021   left foot  . GERD (gastroesophageal reflux disease)   . Hemorrhoid 02/10/2015  . History of hiatal hernia   . History of kidney stones   . HOH (hard of hearing)   . Hypertension   . Lewy body dementia ([place]) 04/04/2020  . Macular degeneration disease   . Osteoporosis   . PAF (paroxysmal atrial fibrillation) ([place])    on Eliquis for OAC - Rate Control   . Prostate cancer ([place])   . Prostate disorder   . Sciatic pain    Chronic  . Sleep apnea    cpap  . Temporary cerebral vascular dysfunction 02/10/2015   Had negative work-up.  July, 2012.  No medication changes, had forgot them that day, got dehydrated.   . Urinary incontinence     Past Surgical History:  Procedure Laterality Date  . ABDOMINAL AORTIC ENDOVASCULAR STENT GRAFT Bilateral 05/18/2019   Procedure: ABDOMINAL AORTIC ENDOVASCULAR STENT GRAFT;  Surgeon: Algernon Huxley, MD;  Location: [facility] ORS;  Service: Vascular;  Laterality: Bilateral;  .  ANKLE SURGERY Left    ORIF   . Arch aortogram and carotid aortogram  06/02/2006   Dr Oneida Alar did surgery  . BASAL CELL CARCINOMA EXCISION  04/2016   Dermatology  . CARDIAC CATHETERIZATION  01/30/1998    (Dr. Linard Millers): Native LAD & mRCA CTO. 90% D1. Mod Cx. SVG-D1 CTO. SVG-RCA & SVG-OM along with LIMA-LAD patent;  LV NORMAL Fxn  . CAROTID ENDARTERECTOMY Left Oct. 29, 2007   Dr. Oneida Alar:  . CATARACT EXTRACTION W/PHACO Right 03/05/2017   Procedure: CATARACT EXTRACTION PHACO AND INTRAOCULAR LENS PLACEMENT (IOC);  Surgeon: Leandrew Koyanagi, MD;  Location: [facility] ORS;  Service: Ophthalmology;  Laterality: Right;  [place] 00:58.5 AP% 10.6 CDE 6.20 Fluid Pack lot # [id number]  . CATARACT EXTRACTION W/PHACO Left 04/15/2017   Procedure: CATARACT EXTRACTION  PHACO AND INTRAOCULAR LENS PLACEMENT ([place])  Left;  Surgeon: Leandrew Koyanagi, MD;  Location: [place];  Service: Ophthalmology;  Laterality: Left;  . COLONOSCOPY WITH PROPOFOL N/A 09/15/2017   Procedure: COLONOSCOPY WITH PROPOFOL;  Surgeon: Lucilla Lame, MD;  Location: [facility] ENDOSCOPY;  Service: Endoscopy;  Laterality: N/A;  . COLONOSCOPY WITH PROPOFOL N/A 10/26/2018   Procedure: COLONOSCOPY WITH PROPOFOL;  Surgeon: Lucilla Lame, MD;  Location: [facility] ENDOSCOPY;  Service: Endoscopy;  Laterality: N/A;  . CORONARY ARTERY BYPASS GRAFT  1998   LIMA-LAD, SVG-OM, SVG-RPDA, SVG-DIAG  . CPET / MET  12/2012   Mild chronotropic incompetence - read 82% of predicted; also reduced effort; peak VO2 15.7 / 75% (did not reach Max effort) -- suggested ischemic response in last 1.5 minutes of exercise. Normal pulmonary function on PFTs but poor response to  . CYSTOSCOPY W/ RETROGRADES Left 01/13/2020   Procedure: CYSTOSCOPY WITH RETROGRADE PYELOGRAM;  Surgeon: Billey Co, MD;  Location: [facility] ORS;  Service: Urology;  Laterality: Left;  . CYSTOSCOPY WITH STENT PLACEMENT Left 12/26/2019   Procedure: CYSTOSCOPY WITH STENT PLACEMENT;  Surgeon: Billey Co, MD;  Location: [facility] ORS;  Service: Urology;  Laterality: Left;  . CYSTOSCOPY/URETEROSCOPY/HOLMIUM LASER/STENT PLACEMENT Left 01/13/2020   Procedure: CYSTOSCOPY/URETEROSCOPY/HOLMIUM LASER/STENT EXCHANGE;  Surgeon: Billey Co, MD;  Location: [facility] ORS;  Service: Urology;  Laterality: Left;  . ENDOVASCULAR REPAIR/STENT GRAFT N/A 05/18/2019   Procedure: ENDOVASCULAR REPAIR/STENT GRAFT;  Surgeon: Algernon Huxley, MD;  Location: [facility] INVASIVE CV LAB;; EVAR - 23 mm prox, 12 cm distal & x 18 cm length Gore Excluder Endoprosthesis Main body (via RFA distal to lowest Renal A); 49m x 14 cm L Contralateral Limb for L Iliac - extended with 8 mm x 6cm LifeStar stent; no additional R Iliac PTA needed.   . ESOPHAGOGASTRODUODENOSCOPY (EGD) WITH PROPOFOL N/A 09/15/2017    Procedure: ESOPHAGOGASTRODUODENOSCOPY (EGD) WITH PROPOFOL;  Surgeon: WLucilla Lame MD;  [facility];  Service: Endoscopy;  Laterality: N/A;  . FRACTURE SURGERY Right 03/19/2015   wrist  . FRACTURE SURGERY Left   . HIATAL HERNIA REPAIR    . ILIAC ARTERY STENT  12/15/2005   PTA and direct stenting rgt and lft common iliac arteries  . KYPHOPLASTY N/A 04/05/2019   Procedure: KYPHOPLASTY T12, L3, L4 L5;  Surgeon: MHessie Knows MD;  Location: [facility] ORS;  Service: Orthopedics;  Laterality: N/A;  . NM GATED MYOVIEW ([place])  05/'19; 8/'20   a) [facility]: Low Risk - no ischemia or infarct.  EF > 65%. no RWMA;; b) ([facility]: Normal wall motion.  No ischemia or infarction.  . OPEN REDUCTION INTERNAL FIXATION (ORIF) DISTAL RADIAL FRACTURE Left 01/13/2019   Procedure:  OPEN REDUCTION INTERNAL FIXATION (ORIF) DISTAL  RADIAL FRACTURE - LEFT - SLEEP APNEA;  Surgeon: Hessie Knows, MD;  Location: [facility] ORS;  Service: Orthopedics;  Laterality: Left;  . ORIF ELBOW FRACTURE Right 01/01/2017   Procedure: OPEN REDUCTION INTERNAL FIXATION (ORIF) ELBOW/OLECRANON FRACTURE;  Surgeon: Hessie Knows, MD;  Location: [facility] ORS;  Service: Orthopedics;  Laterality: Right;  . ORIF WRIST FRACTURE Right 03/19/2015   Procedure: OPEN REDUCTION INTERNAL FIXATION (ORIF) WRIST FRACTURE;  Surgeon: Hessie Knows, MD;  Location: [facility] ORS;  Service: Orthopedics;  Laterality: Right;  . SHOULDER ARTHROSCOPY Right   . THORACIC AORTA - CAROTID ANGIOGRAM  October 2007   Dr. 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 TRANSTHORACIC ECHOCARDIOGRAM  February 2016   [facility]: Normal LV function. Dilated left atrium.  Domingo Dimes ECHOCARDIOGRAM  04/2019   [facility] April 20, 2019): EF 55-60 %.  Mild LVH.  Relatively normal valves.    There were no vitals filed for this visit.   Subjective Assessment - 04/26/20 0932    Subjective Patient  presents with caregiver today. No falls or LOB since last session per patient. Is fatigued and not as verbose.    Pertinent History Patient is a pleasant 86 year old who was last seen by this PT on 12/22/19; since then has been admitted to the hospital and had other medical complications including: two surgeries for kidney stones, another one for his broken L foot. Was in a boot for 6 weeks (boot came off a week ago), and procedures for his back. Has been having hallucinations during the day and night time with increased rate frequently  Pain in back is associated with weakness, had facet joint injections. He has a history of kyphoplasty to T12, L3, L4, L5 performed in August 2020 by Dr. Rudene Christians. MRI from [id number] dated 03/28/2019 report was reviewed today. Acute compression fractures are noted at T12, L3, and L4. He has facet arthropathy at the lower lumbar levels. At L3-4 he has moderate central stenosis. PMH includes AAA, arthritis, atherosclerotic heart disease (s/p CABG), benign neoplasm of ascending/descending colon, COPD, CAD, dementia, dysrhythmia, falls, GERD, hemorrhoid, HOH, HTN, PAF, prostate cancer, osteoporosis, kidney stones, macular degeneration.    Limitations Walking;Standing;House hold activities;Other (comment);Lifting;Sitting    How long can you sit comfortably? 5 minutes    How long can you stand comfortably? needs a walker now, unsteady once standing    How long can you walk comfortably? uses a walker now; can walk in house.    Diagnostic tests MRI from [facility] dated 03/28/2019 report was reviewed today. Acute compression fractures are noted at T12, L3, and L4. He has facet arthropathy at the lower lumbar levels. At L3-4 he has moderate central stenosis.    Patient Stated Goals decrease pain and improve balance.    Currently in Pain? No/denies                   vitals at start of session 97/56    Treatment: Nustep Lvl 3 RPM > 50 for  cardiovascular and musculoskeletal challenge; frequent cueing for increasing velocity of movement and task orientation.   Precor leg press #55lb: 10x with assistance for placing limbs, x 2 trials   By support bar: cues for safety awareness, task orientation and sequencing with close CGA  airex pad 6" step toe taps UE support; frequent R foot collapse 12x each LE  airex pad: static stand 30 seconds  In // bars:  Forward backwards walking 6x length of // bars, cues for widening BOS Balloon taps reaching inside/outside BOS for reaction times, pertubation's, and stability with occasional UE support    airex pad: reach for ball toss onto target without UE support, pertubation one near LOB x 15 balls.     modified forward lunges with UE support. 10x each LE  Seated: RLE prostretch 20x  Green ball adduction 15x  STS with decreased UE support 10x  seated marching in place 10x each LE  Pt educated throughout session about proper posture and technique with exercises. Improved exercise technique, movement at target joints, use of target muscles after min to mod verbal, visual, tactile cues                     PT Education - 04/26/20 0937    Education provided Yes    Education Details exercise technique, body mechanics    Person(s) Educated Patient    Methods Explanation;Demonstration;Tactile cues;Verbal cues    Comprehension Verbalized understanding;Returned demonstration;Verbal cues required;Tactile cues required            PT Short Term Goals - 04/24/20 1304      PT SHORT TERM GOAL #1   Title Patient will be independent in home exercise program to improve strength/mobility for better functional independence with ADLs.    Baseline 6/29: HEP given 8/17: HEP compliant with son 8/24: compliant with son    Time 4    Period Weeks    Status Achieved    Target Date 03/27/20      PT SHORT TERM GOAL #2   Title Patient will perform 5 sit to stand transfers without UE  support to increase functional mobility for carryover to community activities.    Baseline 6/29; only able to perform 2 8/17: able to perform 4    Time 4    Period Weeks    Status Partially Met    Target Date 05/22/20             PT Long Term Goals - 04/24/20 1305      PT LONG TERM GOAL #1   Title Patient will increase FOTO score to equal to or greater than 54% to demonstrate statistically significant improvement in mobility and quality of life.    Baseline 6/29: 54% 8/17: 43.6%    Time 8    Period Weeks    Status On-going    Target Date 06/19/20      PT LONG TERM GOAL #2   Title Patient will demonstrate an improved Berg Balance Score of > 36 as to demonstrate improved balance with ADLs such as sitting/standing and transfer balance and reduced fall risk.    Baseline 6/29: 30/56 8/17: 37/56    Time 8    Period Weeks    Status Achieved      PT LONG TERM GOAL #3   Title Patient will increase 10 meter walk test to >1.78m/s as to improve gait speed for better community ambulation and to reduce fall risk.    Baseline 6/29: 0.71m/s with RW 8/17: >1.0 with rollator    Time 8    Period Weeks    Status Achieved      PT LONG TERM GOAL #4   Title Patient will increase BLE gross strength to 4+/5 as to improve functional strength for independent gait, increased standing tolerance and increased ADL ability.    Baseline 6/29:  see note 8/17: see note    Time 8    Period Weeks    Status Partially Met    Target Date 06/19/20      PT LONG TERM GOAL #5   Title Patient will be able to perform household work/ chores without increase in symptoms.    Baseline 6/29: unable to perform 8/17: limited by balance and cognition as well as pain    Time 8    Period Weeks    Status Partially Met    Target Date 06/19/20                 Plan - 04/26/20 1012    Clinical Impression Statement Patient presents with excellent motivation despite initial fatigue. Is able to interact with "games"  with decreased need for orientation when interest is piqued. Right ankle instability results in occasional foot collapse and instability. He would cont to benefit from skilled PT to increase strength/stability for safer ADL performance, decreased fall risk, and increased functional ability.    Personal Factors and Comorbidities Age;Comorbidity 3+;Fitness;Past/Current Experience;Sex;Social Background;Time since onset of injury/illness/exacerbation;Transportation    Comorbidities AAA, arthritis, atherosclerotic heart disease, (s/p CABG), benign neoplasm of ascending/descending colon, COPD, CAD, dementia, dysrhythmia, frequent falls, GERD, hemorrhoid, HOH, HTN, PAF, prostate cancer.    Examination-Activity Limitations Bathing;Bend;Carry;Dressing;Continence;Stairs;Squat;Reach Overhead;Locomotion Level;Lift;Stand;Transfers;Toileting;Bed Mobility    Examination-Participation Restrictions Church;Cleaning;Community Activity;Driving;Interpersonal Relationship;Laundry;Volunteer;Shop;Personal Finances;Meal Prep;Yard Work    [employer] Evolving/Moderate complexity    Rehab Potential Fair    PT Frequency 2x / week    PT Duration 8 weeks    PT Treatment/Interventions ADLs/Self Care Home Management;Aquatic Therapy;Cryotherapy;Moist Heat;Traction;Ultrasound;Therapeutic activities;Functional mobility training;Stair training;Gait training;DME Instruction;Therapeutic exercise;Balance training;Neuromuscular re-education;Patient/family education;Manual techniques;Passive range of motion;Energy conservation;Vestibular;Taping;Biofeedback;Electrical Stimulation;Iontophoresis 4mg /ml Dexamethasone;Dry needling;Visual/perceptual remediation/compensation    PT Next Visit Plan core stability, LE strength, balance    PT Home Exercise Plan see above    Consulted and Agree with Plan of Care Patient           Patient will benefit from skilled therapeutic intervention in order to improve the following  deficits and impairments:  Abnormal gait, Cardiopulmonary status limiting activity, Decreased activity tolerance, Decreased balance, Decreased knowledge of precautions, Decreased endurance, Decreased coordination, Decreased cognition, Decreased knowledge of use of DME, Decreased mobility, Difficulty walking, Decreased safety awareness, Decreased strength, Impaired flexibility, Impaired perceived functional ability, Impaired sensation, Postural dysfunction, Improper body mechanics, Pain, Impaired vision/preception, Decreased range of motion  Visit Diagnosis: Unsteadiness on feet  Muscle weakness (generalized)  Other abnormalities of gait and mobility  Chronic low back pain, unspecified back pain laterality, unspecified whether sciatica present     Problem List Patient Active Problem List   Diagnosis Date Noted  . Hematuria   . Acute kidney injury superimposed on CKD ([place])   . Sepsis due to gram-negative UTI ([place]) 12/26/2019  . Left nephrolithiasis 12/26/2019  . Hydronephrosis, left 12/26/2019  . Status post abdominal aortic aneurysm (AAA) repair 04/15/2019  . Candidiasis 02/22/2019  . Malignant neoplasm of descending colon ([place])   . Benign neoplasm of descending colon   . Benign neoplasm of transverse colon   . Do not intubate, cardiopulmonary resuscitation (CPR)-only code status 10/08/2018  . Prostate cancer ([place]) 02/25/2018  . Prostatic cyst 01/15/2018  . 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 ([place]) 09/15/2017  . Blood in stool   . Abnormal feces   . Stricture and stenosis of esophagus   . Mild cognitive impairment 08/18/2017  . Senile purpura ([place]) 04/29/2017  . Anemia 04/29/2017  . Frequent  falls 04/29/2017  . Simple chronic bronchitis ([place]) 12/17/2016  . Benign localized hyperplasia of prostate with urinary obstruction 07/15/2016  . Elevated PSA 07/15/2016  . Incomplete emptying of bladder 07/15/2016  . Urge incontinence 07/15/2016  . Hypothyroidism 04/25/2016  . Macular  degeneration 04/25/2016  . Erectile dysfunction due to arterial insufficiency   . Ischemic heart disease with chronotropic incompetence   . CN (constipation) 02/10/2015  . DD (diverticular disease) 02/10/2015  . Weakness 02/10/2015  . Lichen planus 55/20/8022  . Restless leg 02/10/2015  . 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 B12 deficiency 02/10/2015  . COPD (chronic obstructive pulmonary disease) ([place]) 11/14/2014  . Post Inflammatory Lung Changes 11/14/2014  . Acute delirium 11/08/2014  . PAF (paroxysmal atrial fibrillation) (HCC) CHA2DS2-VASc = 4. AC = Eliquis   . Insomnia 12/09/2013  . OSA on CPAP   . Dyspnea on exertion - -essentially resolved with BB dose reduction & wgt loss 03/29/2013  . Overweight (BMI 25.0-29.9) -- Wgt back up 01/17/2013  . Atherosclerotic heart disease of native coronary artery without angina pectoris - s/p CABG, occluded SVG-D1; patent LIMA-LAD, SVG-OM, SVG- RCA   . Essential hypertension   . Hyperlipidemia with target LDL less than 70   . Aorto-iliac disease ([place])   . BCC (basal cell carcinoma), eyelid 01/03/2013  . Allergic rhinitis 08/17/2009  . Adaptation reaction 05/28/2009  . Acid reflux 05/28/2009  . Arthritis, degenerative 05/28/2009  . 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 Carotid artery occlusion without infarction 06/01/2006  . PAD (peripheral artery disease) - bilateral common iliac stents 12/15/2005  . Atherosclerotic heart disease of artery bypass graft 09/01/1997   Janna Arch, PT, DPT   04/26/2020, 10:19 AM  [place] MAIN [facility] SERVICES [address] [place], [place], [address] Phone: [phone]   Fax:  [phone]  Name: [name] MRN: [id number] Date of Birth: Jan 02, 1934

## 2020-04-26 NOTE — Progress Notes (Signed)
[name] ([id number]) Visit Report for 04/25/2020 Chief Complaint Document Details Patient Name: [name], [name] Date of Service: 04/25/2020 10:30 AM Medical Record Number: [id number] Patient Account Number: [id number] Date of Birth/Sex: April 12, 1934 (86 y.o. M) Treating RN: Cornell Barman Primary Care Provider: Lelon Huh Other Clinician: Referring Provider: Thersa Salt Treating Provider/Extender: Tito Dine in Treatment: 0 Information Obtained from: Patient Chief Complaint 04/25/2020; patient is here for an abrasion injury in his left forearm Electronic Signature(s) Signed: 04/26/2020 4:51:35 PM By: Linton Ham MD Entered By: Linton Ham on 04/25/2020 11:23:21 [name] ([id number]) -------------------------------------------------------------------------------- Debridement Details Patient Name: [name] Date of Service: 04/25/2020 10:30 AM Medical Record Number: [id number] Patient Account Number: [id number] Date of Birth/Sex: March 01, 1934 (86 y.o. M) Treating RN: Cornell Barman Primary Care Provider: Lelon Huh Other Clinician: Referring Provider: Thersa Salt Treating Provider/Extender: Tito Dine in Treatment: 0 Debridement Performed for Wound #1 Left,Proximal Forearm Assessment: Performed By: Physician Ricard Dillon, MD Debridement Type: Debridement Level of Consciousness (Pre- Awake and Alert procedure): Pre-procedure Verification/Time Out Yes - 11:02 Taken: Total Area Debrided (L x W): 8.5 (cm) x 1.6 (cm) = 13.6 (cm) Tissue and other material Eschar, Slough, Subcutaneous, Slough debrided: Level: Skin/Subcutaneous Tissue Debridement Description: Excisional Instrument: Curette Bleeding: Moderate Hemostasis Achieved: Pressure Response to Treatment: Procedure was tolerated well Level of Consciousness (Post- Awake and Alert procedure): Post Debridement Measurements of Total Wound Length: (cm)  8.5 Width: (cm) 1.6 Depth: (cm) 0.1 Volume: (cm) 1.068 Character of Wound/Ulcer Post Debridement: Stable Post Procedure Diagnosis Same as Pre-procedure Electronic Signature(s) Signed: 04/25/2020 5:45:00 PM By: Gretta Cool, BSN, RN, CWS, Kim RN, BSN Signed: 04/26/2020 4:51:35 PM By: Linton Ham MD Entered By: Gretta Cool, BSN, RN, CWS, Kim on 04/25/2020 11:07:36 [name] ([id number]) -------------------------------------------------------------------------------- HPI Details Patient Name: [name] Date of Service: 04/25/2020 10:30 AM Medical Record Number: [id number] Patient Account Number: [id number] Date of Birth/Sex: [birth year] (86 y.o. M) Treating RN: Cornell Barman Primary Care Provider: Lelon Huh Other Clinician: Referring Provider: Thersa Salt Treating Provider/Extender: Tito Dine in Treatment: 0 History of Present Illness HPI Description: ADMISSION 04/25/2020 This is an 86 year old man arrives in clinic accompanied by a caregiver. He has a history of recently diagnosed Lewy body dementia with multiple falls. On 8/11 he was going to the bathroom at night stumbled into the door frame traumatizing his left dorsal forearm just below the elbow. Sizable skin tear. He was seen at urgent care in [place]. He was also seen again on 8/15 at the same location. He has been using Neosporin and Telfa. They are able to show me pictures on cell phone this is obviously gotten quite a bit smaller. The patient lives in his own home but has 24/7 care provided by the patient's son, in-home caregivers as well as the neighbor who accompanies him today. Past medical history includes Lewy body dementia recently diagnosed by neurology, gait instability, fracture of the foot and nephrolithiasis Electronic Signature(s) Signed: 04/26/2020 4:51:35 PM By: Linton Ham MD Entered By: Linton Ham on 04/25/2020 11:25:40 [name]  ([id number]) -------------------------------------------------------------------------------- Physical Exam Details Patient Name: [name] Date of Service: 04/25/2020 10:30 AM Medical Record Number: [id number] Patient Account Number: [id number] Date of Birth/Sex: 05-31-1934 (86 y.o. M) Treating RN: Cornell Barman Primary Care Provider: Lelon Huh Other Clinician: Referring Provider: Thersa Salt Treating Provider/Extender: Tito Dine in Treatment: 0 Constitutional Patient is hypertensive.. Pulse regular and within target range for patient.[name] Respirations regular, non-labored and  within target range.. Temperature is normal and within the target range for the patient.[name] appears in no distress. Cardiovascular Radial and brachial pulses on the left were palpable.. Integumentary (Hair, Skin) Some degree of skin fragility with hemosiderin deposition this is probably secondary to chronic skin damage from prolonged solar exposure. Psychiatric No evidence of depression, anxiety, or agitation. Calm, cooperative, and communicative. Appropriate interactions and affect.. Notes Wound exam; this is skin tear on the left dorsal forearm just below the elbow. By review of the picture on a cell phone this is come down quite a bit. Considerable amount of debris over the surface of the wound however. Considerable debridement with a #5 curette relatively removing eschar and subcutaneous debris. The we then washed the wound off with gauze and saline. Clean surface post debridement Electronic Signature(s) Signed: 04/26/2020 4:51:35 PM By: Linton Ham MD Entered By: Linton Ham on 04/25/2020 11:31:37 [name] ([id number]) -------------------------------------------------------------------------------- Physician Orders Details Patient Name: [name] Date of Service: 04/25/2020 10:30 AM Medical Record Number: [id number] Patient Account Number: [id number] Date of  Birth/Sex: 1934-06-26 (86 y.o. M) Treating RN: Cornell Barman Primary Care Provider: Lelon Huh Other Clinician: Referring Provider: Thersa Salt Treating Provider/Extender: Tito Dine in Treatment: 0 Verbal / Phone Orders: No Diagnosis Coding Wound Cleansing Wound #1 Left,Proximal Forearm o Clean wound with Normal Saline. o Dial antibacterial soap, wash wounds, rinse and pat dry prior to dressing wounds Anesthetic (add to Medication List) Wound #1 Left,Proximal Forearm o Topical Lidocaine 4% cream applied to wound bed prior to debridement (In Clinic Only). Primary Wound Dressing Wound #1 Left,Proximal Forearm o Hydrafera Blue Ready Transfer Secondary Dressing Wound #1 Left,Proximal Forearm o ABD and Kerlix/Conform - stretch net to secure Dressing Change Frequency Wound #1 Left,Proximal Forearm o Change Dressing Monday, Wednesday, Friday Follow-up Appointments Wound #1 Left,Proximal Forearm o Return Appointment in 1 week. Home Health Wound #1 Left,Proximal Forearm o [place] for [facility] Nurse may visit PRN to address patientos wound care needs. o FACE TO FACE ENCOUNTER: MEDICARE and MEDICAID PATIENTS: I certify that this patient is under my care and that I had a face-to-face encounter that meets the physician face-to-face encounter requirements with this patient on this date. The encounter with the patient was in whole or in part for the following MEDICAL CONDITION: (primary reason for [place]) MEDICAL NECESSITY: I certify, that based on my findings, NURSING services are a medically necessary home health service. HOME BOUND STATUS: I certify that my clinical findings support that this patient is homebound (i.e., Due to illness or injury, pt requires aid of supportive devices such as crutches, cane, wheelchairs, walkers, the use of special transportation or the assistance of another person to leave  their place of residence. There is a normal inability to leave the home and doing so requires considerable and taxing effort. Other absences are for medical reasons / religious services and are infrequent or of short duration when for other reasons). o If current dressing causes regression in wound condition, may D/C ordered dressing product/s and apply Normal Saline Moist Dressing daily until next [place] / Other MD appointment. Ferguson of regression in wound condition at [phone]. o Please direct any NON-WOUND related issues/requests for orders to patient's Primary Care Physician Electronic Signature(s) Signed: 04/25/2020 5:45:00 PM By: Gretta Cool, BSN, RN, CWS, Kim RN, BSN Signed: 04/26/2020 4:51:35 PM By: Linton Ham MD Entered By: Gretta Cool, BSN, RN, CWS, Kim on  04/25/2020 12:03:57 [name] ([id number]) -------------------------------------------------------------------------------- Problem List Details Patient Name: [name] Date of Service: 04/25/2020 10:30 AM Medical Record Number: [id number] Patient Account Number: [id number] Date of Birth/Sex: 09-07-1933 (86 y.o. M) Treating RN: Cornell Barman Primary Care Provider: Lelon Huh Other Clinician: Referring Provider: Thersa Salt Treating Provider/Extender: Tito Dine in Treatment: 0 Active Problems ICD-10 Encounter Code Description Active Date MDM Diagnosis S40.812D Abrasion of left upper arm, subsequent encounter 04/25/2020 No Yes L98.498 Non-pressure chronic ulcer of skin of other sites with other specified 04/25/2020 No Yes severity G31.83 Dementia with Lewy bodies 04/25/2020 No Yes Inactive Problems Resolved Problems Electronic Signature(s) Signed: 04/26/2020 4:51:35 PM By: Linton Ham MD Entered By: Linton Ham on 04/25/2020 11:14:47 [name]  ([id number]) -------------------------------------------------------------------------------- Progress Note Details Patient Name: [name] Date of Service: 04/25/2020 10:30 AM Medical Record Number: [id number] Patient Account Number: [id number] Date of Birth/Sex: 1934-07-27 (86 y.o. M) Treating RN: Cornell Barman Primary Care Provider: Lelon Huh Other Clinician: Referring Provider: Thersa Salt Treating Provider/Extender: Tito Dine in Treatment: 0 Subjective Chief Complaint Information obtained from Patient 04/25/2020; patient is here for an abrasion injury in his left forearm History of Present Illness (HPI) ADMISSION 04/25/2020 This is an 86 year old man arrives in clinic accompanied by a caregiver. He has a history of recently diagnosed Lewy body dementia with multiple falls. On 8/11 he was going to the bathroom at night stumbled into the door frame traumatizing his left dorsal forearm just below the elbow. Sizable skin tear. He was seen at urgent care in [place]. He was also seen again on 8/15 at the same location. He has been using Neosporin and Telfa. They are able to show me pictures on cell phone this is obviously gotten quite a bit smaller. The patient lives in his own home but has 24/7 care provided by the patient's son, in-home caregivers as well as the neighbor who accompanies him today. Past medical history includes Lewy body dementia recently diagnosed by neurology, gait instability, fracture of the foot and nephrolithiasis Patient History Unable to Obtain Patient History due to Dementia. Allergies Nyacin (Reaction: rash) Family History Hypertension - Mother,Father, No family history of Cancer, Diabetes, Heart Disease, Hereditary Spherocytosis, Kidney Disease, Lung Disease, Seizures, Stroke, Thyroid Problems, Tuberculosis. Social History Former smoker, Marital Status - Widowed, Alcohol Use - Never, Drug Use - No History, Caffeine Use -  Moderate. Medical History Eyes Denies history of Cataracts, Glaucoma, Optic Neuritis Ear/Nose/Mouth/Throat Denies history of Chronic sinus problems/congestion, Middle ear problems Cardiovascular Patient has history of Coronary Artery Disease, Hypertension Denies history of Angina, Arrhythmia, Congestive Heart Failure, Deep Vein Thrombosis, Hypotension, Myocardial Infarction, Peripheral Arterial Disease, Peripheral Venous Disease, Phlebitis, Vasculitis Genitourinary Denies history of End Stage Renal Disease Oncologic Patient has history of Received Radiation - 3 years ago Denies history of Received Chemotherapy Psychiatric Denies history of Anorexia/bulimia, Confinement Anxiety Review of Systems (ROS) Constitutional Symptoms (General Health) Denies complaints or symptoms of Fatigue, Fever, Chills, Marked Weight Change. Eyes Complains or has symptoms of Vision Changes - macular degeneration, Glasses / Contacts. Ear/Nose/Mouth/Throat Denies complaints or symptoms of Difficult clearing ears, Sinusitis. Hematologic/Lymphatic Denies complaints or symptoms of Bleeding / Clotting Disorders, Human Immunodeficiency Virus. Respiratory Denies complaints or symptoms of Chronic or frequent coughs, Shortness of Breath. Cardiovascular Denies complaints or symptoms of Chest pain, LE edema, Quadruple bypass aprox 20 years ago Gastrointestinal Denies complaints or symptoms of Frequent diarrhea, Nausea, Vomiting. Endocrine [name] ([id number]) Denies complaints or symptoms of  Hepatitis, Thyroid disease, Polydypsia (Excessive Thirst). Genitourinary Denies complaints or symptoms of Kidney failure/ Dialysis, Incontinence/dribbling. Immunological Denies complaints or symptoms of Hives, Itching. Integumentary (Skin) Denies complaints or symptoms of Wounds, Bleeding or bruising tendency, Breakdown, Swelling. Musculoskeletal Denies complaints or symptoms of Muscle Pain, Muscle  Weakness. Neurologic Denies complaints or symptoms of Numbness/parasthesias, Focal/Weakness. Oncologic Hx prostate CA aprox 3 years ago with radiatiopn. Psychiatric Denies complaints or symptoms of Anxiety, Claustrophobia, Lewey body dementia. Dx about 2 weeks ago Objective Constitutional Patient is hypertensive.. Pulse regular and within target range for patient.[name] Respirations regular, non-labored and within target range.. Temperature is normal and within the target range for the patient.[name] appears in no distress. Vitals Time Taken: 10:15 AM, Height: 70 in, Source: Stated, Weight: 182 lbs, Source: Stated, BMI: 26.1, Temperature: 98.4 F, Pulse: 68 bpm, Respiratory Rate: 16 breaths/min, Blood Pressure: 151/72 mmHg. Cardiovascular Radial and brachial pulses on the left were palpable.[name] Psychiatric No evidence of depression, anxiety, or agitation. Calm, cooperative, and communicative. Appropriate interactions and affect.. General Notes: Wound exam; this is skin tear on the left dorsal forearm just below the elbow. By review of the picture on a cell phone this is come down quite a bit. Considerable amount of debris over the surface of the wound however. Considerable debridement with a #5 curette relatively removing eschar and subcutaneous debris. The we then washed the wound off with gauze and saline. Clean surface post debridement Integumentary (Hair, Skin) Some degree of skin fragility with hemosiderin deposition this is probably secondary to chronic skin damage from prolonged solar exposure. Wound #1 status is Open. Original cause of wound was Trauma. The wound is located on the Left,Proximal Forearm. The wound measures 8.5cm length x 1.6cm width x 0.1cm depth; 10.681cm^2 area and 1.068cm^3 volume. There is Fat Layer (Subcutaneous Tissue) exposed. There is no tunneling or undermining noted. There is a medium amount of serosanguineous drainage noted. The wound margin is distinct with the  outline attached to the wound base. There is small (1-33%) red granulation within the wound bed. There is a large (67-100%) amount of necrotic tissue within the wound bed including Adherent Slough. Assessment Active Problems ICD-10 Abrasion of left upper arm, subsequent encounter Non-pressure chronic ulcer of skin of other sites with other specified severity Dementia with Lewy bodies Procedures Wound #1 Pre-procedure diagnosis of Wound #1 is a Trauma, Other located on the Left,Proximal Forearm . There was a Excisional Skin/Subcutaneous Tissue KENECHUKWU, ECKSTEIN ([id number]) Debridement with a total area of 13.6 sq cm performed by Ricard Dillon, MD. With the following instrument(s): Curette Material removed includes Eschar, Subcutaneous Tissue, and Slough. A time out was conducted at 11:02, prior to the start of the procedure. A Moderate amount of bleeding was controlled with Pressure. The procedure was tolerated well. Post Debridement Measurements: 8.5cm length x 1.6cm width x 0.1cm depth; 1.068cm^3 volume. Character of Wound/Ulcer Post Debridement is stable. Post procedure Diagnosis Wound #1: Same as Pre-Procedure Plan Wound Cleansing: Wound #1 Left,Proximal Forearm: Clean wound with Normal Saline. Dial antibacterial soap, wash wounds, rinse and pat dry prior to dressing wounds Anesthetic (add to Medication List): Wound #1 Left,Proximal Forearm: Topical Lidocaine 4% cream applied to wound bed prior to debridement (In Clinic Only). Primary Wound Dressing: Wound #1 Left,Proximal Forearm: Hydrafera Blue Ready Transfer Secondary Dressing: Wound #1 Left,Proximal Forearm: ABD and Kerlix/Conform - stretch net to secure Dressing Change Frequency: Wound #1 Left,Proximal Forearm: Change Dressing Monday, Wednesday, Friday Follow-up Appointments: Wound #1 Left,Proximal Forearm: Return Appointment in  1 week. #1 unfortunate 86 year old man recently diagnosed with Lewy body dementia  multiple falls with an extensive skin tear on the left arm. No evidence of current infection. 2. Extensive debridement of debris over the surface of the wound is noted. This cleans up quite nicely 3. We will use Hydrofera Blue with kerlix and conform. This can be changed daily or every second day depending on other needs such as bathing 4. He has 24/7 care at home. He does not have home health however. Electronic Signature(s) Signed: 04/26/2020 4:51:35 PM By: Linton Ham MD Entered By: Linton Ham on 04/25/2020 11:33:52 [name] ([id number]) -------------------------------------------------------------------------------- ROS/PFSH Details Patient Name: [name] Date of Service: 04/25/2020 10:30 AM Medical Record Number: [id number] Patient Account Number: [id number] Date of Birth/Sex: 1933-12-31 (86 y.o. M) Treating RN: Cornell Barman Primary Care Provider: Lelon Huh Other Clinician: Referring Provider: Thersa Salt Treating Provider/Extender: Tito Dine in Treatment: 0 Unable to Obtain Patient History due to oo Dementia Constitutional Symptoms (General Health) Complaints and Symptoms: Negative for: Fatigue; Fever; Chills; Marked Weight Change Eyes Complaints and Symptoms: Positive for: Vision Changes - macular degeneration; Glasses / Contacts Medical History: Negative for: Cataracts; Glaucoma; Optic Neuritis Ear/Nose/Mouth/Throat Complaints and Symptoms: Negative for: Difficult clearing ears; Sinusitis Medical History: Negative for: Chronic sinus problems/congestion; Middle ear problems Hematologic/Lymphatic Complaints and Symptoms: Negative for: Bleeding / Clotting Disorders; Human Immunodeficiency Virus Respiratory Complaints and Symptoms: Negative for: Chronic or frequent coughs; Shortness of Breath Cardiovascular Complaints and Symptoms: Negative for: Chest pain; LE edema Review of System Notes: Quadruple bypass aprox 20 years  ago Medical History: Positive for: Coronary Artery Disease; Hypertension Negative for: Angina; Arrhythmia; Congestive Heart Failure; Deep Vein Thrombosis; Hypotension; Myocardial Infarction; Peripheral Arterial Disease; Peripheral Venous Disease; Phlebitis; Vasculitis Gastrointestinal Complaints and Symptoms: Negative for: Frequent diarrhea; Nausea; Vomiting Endocrine Complaints and Symptoms: Negative for: Hepatitis; Thyroid disease; Polydypsia (Excessive Thirst) Genitourinary Complaints and Symptoms: Negative for: Kidney failure/ Dialysis; Incontinence/dribbling [name] ([id number]) Medical History: Negative for: End Stage Renal Disease Immunological Complaints and Symptoms: Negative for: Hives; Itching Integumentary (Skin) Complaints and Symptoms: Negative for: Wounds; Bleeding or bruising tendency; Breakdown; Swelling Musculoskeletal Complaints and Symptoms: Negative for: Muscle Pain; Muscle Weakness Neurologic Complaints and Symptoms: Negative for: Numbness/parasthesias; Focal/Weakness Psychiatric Complaints and Symptoms: Negative for: Anxiety; Claustrophobia Review of System Notes: Lewey body dementia. Dx about 2 weeks ago Medical History: Negative for: Anorexia/bulimia; Confinement Anxiety Oncologic Complaints and Symptoms: Review of System Notes: Hx prostate CA aprox 3 years ago with radiatiopn. Medical History: Positive for: Received Radiation - 3 years ago Negative for: Received Chemotherapy Immunizations Pneumococcal Vaccine: Received Pneumococcal Vaccination: No Implantable Devices None Family and Social History Cancer: No; Diabetes: No; Heart Disease: No; Hereditary Spherocytosis: No; Hypertension: Yes - Mother,Father; Kidney Disease: No; Lung Disease: No; Seizures: No; Stroke: No; Thyroid Problems: No; Tuberculosis: No; Former smoker; Marital Status - Widowed; Alcohol Use: Never; Drug Use: No History; Caffeine Use: Moderate; Financial  Concerns: No; Food, Clothing or Shelter Needs: No; Support System Lacking: No; Transportation Concerns: No Electronic Signature(s) Signed: 04/25/2020 1:27:38 PM By: Darci Needle Signed: 04/25/2020 5:45:00 PM By: Gretta Cool BSN, RN, CWS, Kim RN, BSN Signed: 04/26/2020 4:51:35 PM By: Linton Ham MD Entered By: Darci Needle on 04/25/2020 10:48:53 [name] ([id number]) -------------------------------------------------------------------------------- [place] Details Patient Name: [name] Date of Service: 04/25/2020 Medical Record Number: [id number] Patient Account Number: [id number] Date of Birth/Sex: 1933-12-12 (86 y.o. M) Treating RN: Cornell Barman Primary Care Provider: Lelon Huh Other Clinician: Referring  Provider: Thersa Salt Treating Provider/Extender: Tito Dine in Treatment: 0 Diagnosis Coding ICD-10 Codes Code Description [phone] Abrasion of left upper arm, subsequent encounter L98.498 Non-pressure chronic ulcer of skin of other sites with other specified severity G31.83 Dementia with Lewy bodies Facility Procedures CPT4 Code: [id number] Description: Brown VISIT-LEV 3 EST PT Modifier: Quantity: 1 CPT4 Code: [id number] Description: 03794 - DEB SUBQ TISSUE 20 SQ CM/< Modifier: Quantity: 1 CPT4 Code: Description: ICD-10 Diagnosis Description L98.498 Non-pressure chronic ulcer of skin of other sites with other specified se S40.812D Abrasion of left upper arm, subsequent encounter Modifier: verity Quantity: Physician Procedures CPT4 Code: [id number] Description: WC PHYS LEVEL 3 o NEW PT Modifier: 25 Quantity: 1 CPT4 Code: Description: ICD-10 Diagnosis Description S40.812D Abrasion of left upper arm, subsequent encounter 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

## 2020-04-26 NOTE — Progress Notes (Signed)
[name] ([id number]) Visit Report for 04/25/2020 Allergy List Details Patient Name: [name], [name] Date of Service: 04/25/2020 10:30 AM Medical Record Number: [id number] Patient Account Number: [id number] Date of Birth/Sex: 11-18-1933 (86 y.o. M) Treating RN: [name] Barman Primary Care Trish Mancinelli: Lelon Huh Other Clinician: Referring Osvaldo Lamping: Thersa Salt Treating Irvan Tiedt/Extender: Ricard Dillon Weeks in Treatment: 0 Allergies Active Allergies Nyacin Reaction: rash Allergy Notes Electronic Signature(s) Signed: 04/25/2020 1:27:38 PM By: Darci Needle Entered By: Darci Needle on 04/25/2020 10:39:18 [name] ([id number]) -------------------------------------------------------------------------------- Arrival Information Details Patient Name: [name] Date of Service: 04/25/2020 10:30 AM Medical Record Number: [id number] Patient Account Number: [id number] Date of Birth/Sex: 07-22-1934 (86 y.o. M) Treating RN: [name] Barman Primary Care Jony Ladnier: Lelon Huh Other Clinician: Referring Kazuki Ingle: Thersa Salt Treating Ahriana Gunkel/Extender: Tito Dine in Treatment: 0 Visit Information Patient Arrived: Ambulatory Arrival Time: 10:31 Accompanied By: friend Transfer Assistance: None Patient Identification Verified: Yes Secondary Verification Process Completed: Yes Patient Requires Transmission-Based Precautions: No Patient Has Alerts: No Electronic Signature(s) Signed: 04/25/2020 1:27:38 PM By: Darci Needle Entered By: Darci Needle on 04/25/2020 10:56:33 [name] ([id number]) -------------------------------------------------------------------------------- Clinic Level of Care Assessment Details Patient Name: [name] Date of Service: 04/25/2020 10:30 AM Medical Record Number: [id number] Patient Account Number: [id number] Date of Birth/Sex: 05/12/1934 (86 y.o. M) Treating RN: [name] Barman Primary Care  Wynston Romey: Lelon Huh Other Clinician: Referring Roselin Wiemann: Thersa Salt Treating Jesusa Stenerson/Extender: Tito Dine in Treatment: 0 Clinic Level of Care Assessment Items TOOL 1 Quantity Score []  - Use when EandM and Procedure is performed on INITIAL visit 0 ASSESSMENTS - Nursing Assessment / Reassessment X - General Physical Exam (combine w/ comprehensive assessment (listed just below) when performed on new 1 20 pt. evals) X- 1 25 Comprehensive Assessment (HX, ROS, Risk Assessments, Wounds Hx, etc.) ASSESSMENTS - Wound and Skin Assessment / Reassessment []  - Dermatologic / Skin Assessment (not related to wound area) 0 ASSESSMENTS - Ostomy and/or Continence Assessment and Care []  - Incontinence Assessment and Management 0 []  - 0 Ostomy Care Assessment and Management (repouching, etc.) PROCESS - Coordination of Care X - Simple Patient / Family Education for ongoing care 1 15 []  - 0 Complex (extensive) Patient / Family Education for ongoing care X- 1 10 Staff obtains Consents, Records, Test Results / Process Orders []  - 0 Staff telephones HHA, Nursing Homes / Clarify orders / etc []  - 0 Routine Transfer to another Facility (non-emergent condition) []  - 0 Routine Hospital Admission (non-emergent condition) X- 1 15 New Admissions / Biomedical engineer / Ordering NPWT, Apligraf, etc. []  - 0 Emergency Hospital Admission (emergent condition) PROCESS - Special Needs []  - Pediatric / Minor Patient Management 0 []  - 0 Isolation Patient Management []  - 0 Hearing / Language / Visual special needs []  - 0 Assessment of Community assistance (transportation, D/C planning, etc.) []  - 0 Additional assistance / Altered mentation []  - 0 Support Surface(s) Assessment (bed, cushion, seat, etc.) INTERVENTIONS - Miscellaneous []  - External ear exam 0 []  - 0 Patient Transfer (multiple staff / Civil Service fast streamer / Similar devices) []  - 0 Simple Staple / Suture removal (25 or less) []   - 0 Complex Staple / Suture removal (26 or more) []  - 0 Hypo/Hyperglycemic Management (do not check if billed separately) []  - 0 Ankle / Brachial Index (ABI) - do not check if billed separately Has the patient been seen at the hospital within the last three years: Yes Total Score: 85 Level Of Care:  New/Established - Level 3 JULIUS, MATUS ([id number]) Electronic Signature(s) Signed: 04/25/2020 5:45:00 PM By: Gretta Cool, BSN, RN, CWS, Kim RN, BSN Entered By: Gretta Cool, BSN, RN, CWS, Kim on 04/25/2020 11:11:22 [name] ([id number]) -------------------------------------------------------------------------------- Encounter Discharge Information Details Patient Name: [name] Date of Service: 04/25/2020 10:30 AM Medical Record Number: [id number] Patient Account Number: [id number] Date of Birth/Sex: 05/15/1934 (86 y.o. M) Treating RN: [name] Barman Primary Care Delona Clasby: Lelon Huh Other Clinician: Referring Nejla Reasor: Thersa Salt Treating Chanise Habeck/Extender: Tito Dine in Treatment: 0 Encounter Discharge Information Items Post Procedure Vitals Discharge Condition: Stable Temperature (F): 98.4 Ambulatory Status: Ambulatory Pulse (bpm): 68 Discharge Destination: Home Respiratory Rate (breaths/min): 18 Transportation: Private Auto Blood Pressure (mmHg): 151/72 Accompanied By: caregiver Schedule Follow-up Appointment: Yes Clinical Summary of Care: Electronic Signature(s) Signed: 04/25/2020 5:45:00 PM By: Gretta Cool, BSN, RN, CWS, Kim RN, BSN Entered By: Gretta Cool, BSN, RN, CWS, Kim on 04/25/2020 11:13:05 [name] ([id number]) -------------------------------------------------------------------------------- Lower Extremity Assessment Details Patient Name: [name] Date of Service: 04/25/2020 10:30 AM Medical Record Number: [id number] Patient Account Number: [id number] Date of Birth/Sex: [birth year] (86 y.o. M) Treating RN: [name] Barman Primary Care  Elya Diloreto: Lelon Huh Other Clinician: Referring Zyren Sevigny: Thersa Salt Treating Demetric Parslow/Extender: Tito Dine in Treatment: 0 Electronic Signature(s) Signed: 04/25/2020 1:27:38 PM By: Darci Needle Signed: 04/25/2020 5:45:00 PM By: Gretta Cool, BSN, RN, CWS, Kim RN, BSN Entered By: Darci Needle on 04/25/2020 10:38:16 [name] ([id number]) -------------------------------------------------------------------------------- Multi Wound Chart Details Patient Name: [name] Date of Service: 04/25/2020 10:30 AM Medical Record Number: [id number] Patient Account Number: [id number] Date of Birth/Sex: [birth year] (86 y.o. M) Treating RN: [name] Barman Primary Care Lyndell Gillyard: Lelon Huh Other Clinician: Referring Aidyn Kellis: Thersa Salt Treating Haizel Gatchell/Extender: Tito Dine in Treatment: 0 Vital Signs Height(in): 70 Pulse(bpm): 68 Weight(lbs): 182 Blood Pressure(mmHg): 151/72 Body Mass Index(BMI): 26 Temperature(F): 98.4 Respiratory Rate(breaths/min): 16 Photos: [N/A:N/A] Wound Location: Left, Proximal Forearm N/A N/A Wounding Event: Trauma N/A N/A Primary Etiology: Trauma, Other N/A N/A Date Acquired: 04/11/2020 N/A N/A Weeks of Treatment: 0 N/A N/A Wound Status: Open N/A N/A Measurements L x W x D (cm) 8.5x1.6x0.1 N/A N/A Area (cm) : 10.681 N/A N/A Volume (cm) : 1.068 N/A N/A Classification: Partial Thickness N/A N/A Exudate Amount: Medium N/A N/A Exudate Type: Serosanguineous N/A N/A Exudate Color: red, brown N/A N/A Wound Margin: Distinct, outline attached N/A N/A Granulation Amount: Small (1-33%) N/A N/A Granulation Quality: Red N/A N/A Necrotic Amount: Large (67-100%) N/A N/A Exposed Structures: Fat Layer (Subcutaneous Tissue): N/A N/A Yes Fascia: No Tendon: No Muscle: No Joint: No Bone: No Epithelialization: Small (1-33%) N/A N/A Treatment Notes Electronic Signature(s) Signed: 04/25/2020 5:45:00 PM By: Gretta Cool, BSN, RN,  CWS, Kim RN, BSN Entered By: Gretta Cool, BSN, RN, CWS, Kim on 04/25/2020 11:06:25 [name] ([id number]) -------------------------------------------------------------------------------- Multi-Disciplinary Care Plan Details Patient Name: [name] Date of Service: 04/25/2020 10:30 AM Medical Record Number: [id number] Patient Account Number: [id number] Date of Birth/Sex: [birth year] (86 y.o. M) Treating RN: [name] Barman Primary Care Mattilynn Forrer: Lelon Huh Other Clinician: Referring Yani Lal: Thersa Salt Treating Ellyanna Holton/Extender: Tito Dine in Treatment: 0 Active Inactive Necrotic Tissue Nursing Diagnoses: Impaired tissue integrity related to necrotic/devitalized tissue Goals: Necrotic/devitalized tissue will be minimized in the wound bed Date Initiated: 04/25/2020 Target Resolution Date: 05/02/2020 Goal Status: Active Interventions: Assess patient pain level pre-, during and post procedure and prior to discharge Treatment Activities: Excisional debridement : 04/25/2020 Notes: Orientation to the Wound Care Program Nursing  Diagnoses: Knowledge deficit related to the wound healing center program Goals: Patient/caregiver will verbalize understanding of the [place] Date Initiated: 04/25/2020 Target Resolution Date: 05/02/2020 Goal Status: Active Interventions: Provide education on orientation to the wound center Notes: Wound/Skin Impairment Nursing Diagnoses: Impaired tissue integrity Goals: Ulcer/skin breakdown will have a volume reduction of 30% by week 4 Date Initiated: 04/25/2020 Target Resolution Date: 05/26/2020 Goal Status: Active Interventions: Assess ulceration(s) every visit Treatment Activities: Topical wound management initiated : 04/25/2020 Notes: Electronic Signature(s) Signed: 04/25/2020 5:45:00 PM By: Gretta Cool, BSN, RN, CWS, Kim RN, BSN Entered By: Gretta Cool, BSN, RN, CWS, Kim on 04/25/2020 11:06:06 [name]  ([id number]) [name] ([id number]) -------------------------------------------------------------------------------- Pain Assessment Details Patient Name: [name] Date of Service: 04/25/2020 10:30 AM Medical Record Number: [id number] Patient Account Number: [id number] Date of Birth/Sex: 10/15/1933 (86 y.o. M) Treating RN: [name] Barman Primary Care Kameelah Minish: Lelon Huh Other Clinician: Referring Manasi Dishon: Thersa Salt Treating Jantz Main/Extender: Tito Dine in Treatment: 0 Active Problems Location of Pain Severity and Description of Pain Patient Has Paino No Site Locations With Dressing Change: No Pain Management and Medication Current Pain Management: Electronic Signature(s) Signed: 04/25/2020 1:27:38 PM By: Darci Needle Signed: 04/25/2020 5:45:00 PM By: Gretta Cool, BSN, RN, CWS, Kim RN, BSN Entered By: Darci Needle on 04/25/2020 10:32:31 [name] ([id number]) -------------------------------------------------------------------------------- Patient/Caregiver Education Details Patient Name: [name] Date of Service: 04/25/2020 10:30 AM Medical Record Number: [id number] Patient Account Number: [id number] Date of Birth/Gender: Jan 28, 1934 (86 y.o. M) Treating RN: [name] Barman Primary Care Physician: Lelon Huh Other Clinician: Referring Physician: Thersa Salt Treating Physician/Extender: Tito Dine in Treatment: 0 Education Assessment Education Provided To: Caregiver Education Topics Provided Welcome To [facility]: Handouts: [facility] Methods: Demonstration, Explain/Verbal Responses: State content correctly Wound Debridement: Handouts: Wound Debridement Methods: Demonstration, Explain/Verbal Responses: State content correctly Electronic Signature(s) Signed: 04/25/2020 5:45:00 PM By: Gretta Cool, BSN, RN, CWS, Kim RN, BSN Entered By: Gretta Cool, BSN, RN, CWS, Kim on 04/25/2020  11:11:52 [name] ([id number]) -------------------------------------------------------------------------------- Wound Assessment Details Patient Name: [name] Date of Service: 04/25/2020 10:30 AM Medical Record Number: [id number] Patient Account Number: [id number] Date of Birth/Sex: 1933-11-16 (86 y.o. M) Treating RN: [name] Barman Primary Care Bricelyn Freestone: Lelon Huh Other Clinician: Referring Dom Haverland: Thersa Salt Treating Abie Cheek/Extender: Tito Dine in Treatment: 0 Wound Status Wound Number: 1 Primary Etiology: Trauma, Other Wound Location: Left, Proximal Forearm Wound Status: Open Wounding Event: Trauma Date Acquired: 04/11/2020 Weeks Of Treatment: 0 Clustered Wound: No Photos Wound Measurements Length: (cm) 8.5 Width: (cm) 1.6 Depth: (cm) 0.1 Area: (cm) 10.681 Volume: (cm) 1.068 % Reduction in Area: % Reduction in Volume: Epithelialization: Small (1-33%) Tunneling: No Undermining: No Wound Description Classification: Partial Thickness Foul Wound Margin: Distinct, outline attached Sloug Exudate Amount: Medium Exudate Type: Serosanguineous Exudate Color: red, brown Odor After Cleansing: No h/Fibrino No Wound Bed Granulation Amount: Small (1-33%) Exposed Structure Granulation Quality: Red Fascia Exposed: No Necrotic Amount: Large (67-100%) Fat Layer (Subcutaneous Tissue) Exposed: Yes Necrotic Quality: Adherent Slough Tendon Exposed: No Muscle Exposed: No Joint Exposed: No Bone Exposed: No Treatment Notes Wound #1 (Left, Proximal Forearm) Notes Hydrofera Blue, ABD, Conform, stretch net Electronic Signature(s) Signed: 04/25/2020 1:27:38 PM By: Edson Snowball ([id number]) Signed: 04/25/2020 5:45:00 PM By: Gretta Cool, BSN, RN, CWS, Kim RN, BSN Entered By: Darci Needle on 04/25/2020 10:38:02 [name]  ([id number]) -------------------------------------------------------------------------------- Vitals Details Patient Name: [name] Date  of Service: 04/25/2020 10:30 AM Medical Record Number: [id number] Patient Account Number: [id number] Date of Birth/Sex: March 24, 1934 (86 y.o. M) Treating RN: [name] Barman Primary Care Ranbir Chew: Lelon Huh Other Clinician: Referring Josey Forcier: Thersa Salt Treating Mykelle Cockerell/Extender: Tito Dine in Treatment: 0 Vital Signs Time Taken: 10:15 Temperature (F): 98.4 Height (in): 70 Pulse (bpm): 68 Source: Stated Respiratory Rate (breaths/min): 16 Weight (lbs): 182 Blood Pressure (mmHg): 151/72 Source: Stated Reference Range: 80 - 120 mg / dl Body Mass Index (BMI): 26.1 Electronic Signature(s) Signed: 04/25/2020 1:27:38 PM By: Darci Needle Entered By: Darci Needle on 04/25/2020 10:33:25

## 2020-05-01 ENCOUNTER — Ambulatory Visit: Payer: Medicare Other

## 2020-05-01 ENCOUNTER — Other Ambulatory Visit: Payer: Self-pay

## 2020-05-01 DIAGNOSIS — R2689 Other abnormalities of gait and mobility: Secondary | ICD-10-CM

## 2020-05-01 DIAGNOSIS — M545 Low back pain, unspecified: Secondary | ICD-10-CM

## 2020-05-01 DIAGNOSIS — M6281 Muscle weakness (generalized): Secondary | ICD-10-CM

## 2020-05-01 DIAGNOSIS — G8929 Other chronic pain: Secondary | ICD-10-CM

## 2020-05-01 DIAGNOSIS — R2681 Unsteadiness on feet: Secondary | ICD-10-CM

## 2020-05-01 NOTE — Therapy (Signed)
[place] MAIN [facility] SERVICES [address] [place], [place], [address] Phone: [phone]   Fax:  [phone]  Physical Therapy Treatment  Patient Details  Name: [name] MRN: [id number] Date of Birth: 30-Mar-1934 Referring Provider (PT): Meeler, Sherren Kerns FNP   Encounter Date: 05/01/2020   PT End of Session - 05/01/20 0950    Visit Number 14    Number of Visits 28    Date for PT Re-Evaluation 06/19/20    Authorization Type 4/10 PN 04/17/20    PT Start Time 0935    PT Stop Time 1014    PT Time Calculation (min) 39 min    Equipment Utilized During Treatment Gait belt    Activity Tolerance Patient tolerated treatment well;Patient limited by fatigue    Behavior During Therapy [facility] for tasks assessed/performed           Past Medical History:  Diagnosis Date  . AAA (abdominal aortic aneurysm) ([place]) 05/2019   [place] Rapid progression from June-Aug 2020 (4.6 - 5.4 cm): s/p EVAR (Dr. Lucky Cowboy [facility]): EVAR - 23 mm prox, 12 cm distal & x 18 cm length Gore Excluder Endoprosthesis Main body (via RFA distal to lowest Renal A); 78m x 14 cm L Contralateral Limb for L Iliac - extended with 8 mm x 6cm LifeStar stent (post-dilated with 7 mm DEB).  Additional R Iliac PTA not required.   . Arthritis   . Basal cell carcinoma of eyelid 01/03/2013   2019 - R side of Nose  . Benign neoplasm of ascending colon   . Benign neoplasm of descending colon   . Bilateral iliac artery stenosis ([place] 2007   Bilateral Iliac A stenting; extension of EVAR limbs into both Iliacs -- additional stent placed in L Iliac.  . Cancer ([place]    Skin cancer  . Colon polyps   . Complication of anesthesia    severe confusion agitation requiring hospital admission x 4 days  . Compression fracture of fourth lumbar vertebra ([place] 04/2019   - s/p Kyphoplasty; T12, L3-5 ([facility]  . COPD (chronic obstructive pulmonary disease) (HCC)    Centrilobular Emphysema  . Coronary artery  disease involving native coronary artery without angina pectoris 1998   - s/p CABG, occluded SVG-D1; patent LIMA-LAD, SVG-OM, SVG- RCA  . Dementia ([place]    memory loss - started noting late 2019  . Falls frequently    broke foot  . Fracture 2021   left foot  . GERD (gastroesophageal reflux disease)   . Hemorrhoid 02/10/2015  . History of hiatal hernia   . History of kidney stones   . HOH (hard of hearing)   . Hypertension   . Lewy body dementia (HCable 04/04/2020  . Macular degeneration disease   . Osteoporosis   . PAF (paroxysmal atrial fibrillation) ([place]    on Eliquis for OAC - Rate Control   . Prostate cancer ([place]   . Prostate disorder   . Sciatic pain    Chronic  . Sleep apnea    cpap  . Temporary cerebral vascular dysfunction 02/10/2015   Had negative work-up.  July, 2012.  No medication changes, had forgot them that day, got dehydrated.   . Urinary incontinence     Past Surgical History:  Procedure Laterality Date  . ABDOMINAL AORTIC ENDOVASCULAR STENT GRAFT Bilateral 05/18/2019   Procedure: ABDOMINAL AORTIC ENDOVASCULAR STENT GRAFT;  Surgeon: DAlgernon Huxley MD;  Location: [facility] ORS;  Service: Vascular;  Laterality: Bilateral;  .  ANKLE SURGERY Left    ORIF   . Arch aortogram and carotid aortogram  06/02/2006   Dr Oneida Alar did surgery  . BASAL CELL CARCINOMA EXCISION  04/2016   Dermatology  . CARDIAC CATHETERIZATION  01/30/1998    (Dr. Linard Millers): Native LAD & mRCA CTO. 90% D1. Mod Cx. SVG-D1 CTO. SVG-RCA & SVG-OM along with LIMA-LAD patent;  LV NORMAL Fxn  . CAROTID ENDARTERECTOMY Left Oct. 29, 2007   Dr. Oneida Alar:  . CATARACT EXTRACTION W/PHACO Right 03/05/2017   Procedure: CATARACT EXTRACTION PHACO AND INTRAOCULAR LENS PLACEMENT (IOC);  Surgeon: Leandrew Koyanagi, MD;  Location: [facility] ORS;  Service: Ophthalmology;  Laterality: Right;  [place] 00:58.5 AP% 10.6 CDE 6.20 Fluid Pack lot # [id number]  . CATARACT EXTRACTION W/PHACO Left 04/15/2017   Procedure: CATARACT EXTRACTION  PHACO AND INTRAOCULAR LENS PLACEMENT ([place])  Left;  Surgeon: Leandrew Koyanagi, MD;  Location: [place];  Service: Ophthalmology;  Laterality: Left;  . COLONOSCOPY WITH PROPOFOL N/A 09/15/2017   Procedure: COLONOSCOPY WITH PROPOFOL;  Surgeon: Lucilla Lame, MD;  Location[facility] ENDOSCOPY;  Service: Endoscopy;  Laterality: N/A;  . COLONOSCOPY WITH PROPOFOL N/A 10/26/2018   Procedure: COLONOSCOPY WITH PROPOFOL;  Surgeon: Lucilla Lame, MD;  Location: [facility] ENDOSCOPY;  Service: Endoscopy;  Laterality: N/A;  . CORONARY ARTERY BYPASS GRAFT  1998   LIMA-LAD, SVG-OM, SVG-RPDA, SVG-DIAG  . CPET / MET  12/2012   Mild chronotropic incompetence - read 82% of predicted; also reduced effort; peak VO2 15.7 / 75% (did not reach Max effort) -- suggested ischemic response in last 1.5 minutes of exercise. Normal pulmonary function on PFTs but poor response to  . CYSTOSCOPY W/ RETROGRADES Left 01/13/2020   Procedure: CYSTOSCOPY WITH RETROGRADE PYELOGRAM;  Surgeon: Billey Co, MD;  Location: [facility] ORS;  Service: Urology;  Laterality: Left;  . CYSTOSCOPY WITH STENT PLACEMENT Left 12/26/2019   Procedure: CYSTOSCOPY WITH STENT PLACEMENT;  Surgeon: Billey Co, MD;  Location: [facility] ORS;  Service: Urology;  Laterality: Left;  . CYSTOSCOPY/URETEROSCOPY/HOLMIUM LASER/STENT PLACEMENT Left 01/13/2020   Procedure: CYSTOSCOPY/URETEROSCOPY/HOLMIUM LASER/STENT EXCHANGE;  Surgeon: Billey Co, MD;  Location: [facility] ORS;  Service: Urology;  Laterality: Left;  . ENDOVASCULAR REPAIR/STENT GRAFT N/A 05/18/2019   Procedure: ENDOVASCULAR REPAIR/STENT GRAFT;  Surgeon: Algernon Huxley, MD;  Location: [facility] INVASIVE CV LAB;; EVAR - 23 mm prox, 12 cm distal & x 18 cm length Gore Excluder Endoprosthesis Main body (via RFA distal to lowest Renal A); 17m x 14 cm L Contralateral Limb for L Iliac - extended with 8 mm x 6cm LifeStar stent; no additional R Iliac PTA needed.   . ESOPHAGOGASTRODUODENOSCOPY (EGD) WITH PROPOFOL N/A 09/15/2017    Procedure: ESOPHAGOGASTRODUODENOSCOPY (EGD) WITH PROPOFOL;  Surgeon: WLucilla Lame MD;  [facility];  Service: Endoscopy;  Laterality: N/A;  . FRACTURE SURGERY Right 03/19/2015   wrist  . FRACTURE SURGERY Left   . HIATAL HERNIA REPAIR    . ILIAC ARTERY STENT  12/15/2005   PTA and direct stenting rgt and lft common iliac arteries  . KYPHOPLASTY N/A 04/05/2019   Procedure: KYPHOPLASTY T12, L3, L4 L5;  Surgeon: MHessie Knows MD;  Location: [facility] ORS;  Service: Orthopedics;  Laterality: N/A;  . NM GATED MYOVIEW ([place])  05/'19; 8/'20   a) [facility]: Low Risk - no ischemia or infarct.  EF > 65%. no RWMA;; b) ([facility]: Normal wall motion.  No ischemia or infarction.  . OPEN REDUCTION INTERNAL FIXATION (ORIF) DISTAL RADIAL FRACTURE Left 01/13/2019   Procedure:  OPEN REDUCTION INTERNAL FIXATION (ORIF) DISTAL  RADIAL FRACTURE - LEFT - SLEEP APNEA;  Surgeon: Hessie Knows, MD;  Location: [facility] ORS;  Service: Orthopedics;  Laterality: Left;  . ORIF ELBOW FRACTURE Right 01/01/2017   Procedure: OPEN REDUCTION INTERNAL FIXATION (ORIF) ELBOW/OLECRANON FRACTURE;  Surgeon: Hessie Knows, MD;  Location: [facility] ORS;  Service: Orthopedics;  Laterality: Right;  . ORIF WRIST FRACTURE Right 03/19/2015   Procedure: OPEN REDUCTION INTERNAL FIXATION (ORIF) WRIST FRACTURE;  Surgeon: Hessie Knows, MD;  Location: [facility] ORS;  Service: Orthopedics;  Laterality: Right;  . SHOULDER ARTHROSCOPY Right   . THORACIC AORTA - CAROTID ANGIOGRAM  October 2007   Dr. 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 TRANSTHORACIC ECHOCARDIOGRAM  February 2016   [facility]: Normal LV function. Dilated left atrium.  Domingo Dimes ECHOCARDIOGRAM  04/2019   [facility] April 20, 2019): EF 55-60 %.  Mild LVH.  Relatively normal valves.    There were no vitals filed for this visit.   Subjective Assessment - 05/01/20 0943    Subjective Patient  reports he is running behind today, can't seem to get going this morning. Reports no back pain.    Pertinent History Patient is a pleasant 86 year old who was last seen by this PT on 12/22/19; since then has been admitted to the hospital and had other medical complications including: two surgeries for kidney stones, another one for his broken L foot. Was in a boot for 6 weeks (boot came off a week ago), and procedures for his back. Has been having hallucinations during the day and night time with increased rate frequently  Pain in back is associated with weakness, had facet joint injections. He has a history of kyphoplasty to T12, L3, L4, L5 performed in August 2020 by Dr. Rudene Christians. [facility] dated 03/28/2019 report was reviewed today. Acute compression fractures are noted at T12, L3, and L4. He has facet arthropathy at the lower lumbar levels. At L3-4 he has moderate central stenosis. PMH includes AAA, arthritis, atherosclerotic heart disease (s/p CABG), benign neoplasm of ascending/descending colon, COPD, CAD, dementia, dysrhythmia, falls, GERD, hemorrhoid, HOH, HTN, PAF, prostate cancer, osteoporosis, kidney stones, macular degeneration.    Limitations Walking;Standing;House hold activities;Other (comment);Lifting;Sitting    How long can you sit comfortably? 5 minutes    How long can you stand comfortably? needs a walker now, unsteady once standing    How long can you walk comfortably? uses a walker now; can walk in house.    Diagnostic tests MRI from [facility] dated 03/28/2019 report was reviewed today. Acute compression fractures are noted at T12, L3, and L4. He has facet arthropathy at the lower lumbar levels. At L3-4 he has moderate central stenosis.    Patient Stated Goals decrease pain and improve balance.    Currently in Pain? No/denies               vitals at start of session 97/52   Treatment:   By support bar: cues for safety awareness, task  orientation and sequencing with close CGA   Standing with # 4 ankle weight: CGA for stability  -Hip extension with bilateral upper extremity support, cueing for neutral hip alignment, upright posture for optimal muscle recruitment, and sequencing, 15x each LE,  -Hip abduction with bilateral upper extremity support, cueing for neutral foot alignment for correct muscle activation, 10x each LE -Hip flexion with bilateral upper  extremity support, cueing for body mechanics, speed of muscle recruitment for optimal strengthening and stabilization 15x each LE -forward and backwards ambulation 4x length of // bars with close CGA    Seated with # 4 lb ankle weights  -Seated marches with upright posture, back away from back of chair for abdominal/trunk activation/stabilization, 10x each LE -Seated LAQ with 3 second holds, 10x each LE, cueing for muscle activation and sequencing for neutral alignment -Seated IR/ER with cueing for stabilizing knee placement with lateral foot movement for optimal muscle recruitment, 10x each LE  airex pad balance beam: lateral stepping 4x length of // bars    Step over red band and back 10x each LE ; BUE support     airex pad: reach for ball toss onto target without UE support, pertubation one near LOB x 15 balls.     modified forward lunges with UE support. 10x each LE   10x STS with UE support   Pt educated throughout session about proper posture and technique with exercises. Improved exercise technique, movement at target joints, use of target muscles after min to mod verbal, visual, tactile cues                     PT Education - 05/01/20 0949    Education provided Yes    Education Details exercise technique, body mechanics,    Person(s) Educated Patient    Methods Explanation;Demonstration;Tactile cues;Verbal cues    Comprehension Verbalized understanding;Returned demonstration;Verbal cues required;Tactile cues required            PT  Short Term Goals - 04/24/20 1304      PT SHORT TERM GOAL #1   Title Patient will be independent in home exercise program to improve strength/mobility for better functional independence with ADLs.    Baseline 6/29: HEP given 8/17: HEP compliant with son 8/24: compliant with son    Time 4    Period Weeks    Status Achieved    Target Date 03/27/20      PT SHORT TERM GOAL #2   Title Patient will perform 5 sit to stand transfers without UE support to increase functional mobility for carryover to community activities.    Baseline 6/29; only able to perform 2 8/17: able to perform 4    Time 4    Period Weeks    Status Partially Met    Target Date 05/22/20             PT Long Term Goals - 04/24/20 1305      PT LONG TERM GOAL #1   Title Patient will increase FOTO score to equal to or greater than 54% to demonstrate statistically significant improvement in mobility and quality of life.    Baseline 6/29: 54% 8/17: 43.6%    Time 8    Period Weeks    Status On-going    Target Date 06/19/20      PT LONG TERM GOAL #2   Title Patient will demonstrate an improved Berg Balance Score of > 36 as to demonstrate improved balance with ADLs such as sitting/standing and transfer balance and reduced fall risk.    Baseline 6/29: 30/56 8/17: 37/56    Time 8    Period Weeks    Status Achieved      PT LONG TERM GOAL #3   Title Patient will increase 10 meter walk test to >1.85ms as to improve gait speed for better community ambulation and to reduce fall risk.  Baseline 6/29: 0.83ms with RW 8/17: >1.0 with rollator    Time 8    Period Weeks    Status Achieved      PT LONG TERM GOAL #4   Title Patient will increase BLE gross strength to 4+/5 as to improve functional strength for independent gait, increased standing tolerance and increased ADL ability.    Baseline 6/29: see note 8/17: see note    Time 8    Period Weeks    Status Partially Met    Target Date 06/19/20      PT LONG TERM GOAL #5    Title Patient will be able to perform household work/ chores without increase in symptoms.    Baseline 6/29: unable to perform 8/17: limited by balance and cognition as well as pain    Time 8    Period Weeks    Status Partially Met    Target Date 06/19/20                 Plan - 05/01/20 1238    Clinical Impression Statement Patient is more challenged with RLE than LLE this session requiring frequent rest breaks for fatigue. Discussion with patient's son about patient beginning speech therapy for memory and slurring of speech.  Referral sent to physician for signature. He would cont to benefit from skilled PT to increase strength/stability for safer ADL performance, decreased fall risk, and increased functional ability.    Personal Factors and Comorbidities Age;Comorbidity 3+;Fitness;Past/Current Experience;Sex;Social Background;Time since onset of injury/illness/exacerbation;Transportation    Comorbidities AAA, arthritis, atherosclerotic heart disease, (s/p CABG), benign neoplasm of ascending/descending colon, COPD, CAD, dementia, dysrhythmia, frequent falls, GERD, hemorrhoid, HOH, HTN, PAF, prostate cancer.    Examination-Activity Limitations Bathing;Bend;Carry;Dressing;Continence;Stairs;Squat;Reach Overhead;Locomotion Level;Lift;Stand;Transfers;Toileting;Bed Mobility    Examination-Participation Restrictions Church;Cleaning;Community Activity;Driving;Interpersonal Relationship;Laundry;Volunteer;Shop;Personal Finances;Meal Prep;Yard Work    SMerchant navy officerEvolving/Moderate complexity    Rehab Potential Fair    PT Frequency 2x / week    PT Duration 8 weeks    PT Treatment/Interventions ADLs/Self Care Home Management;Aquatic Therapy;Cryotherapy;Moist Heat;Traction;Ultrasound;Therapeutic activities;Functional mobility training;Stair training;Gait training;DME Instruction;Therapeutic exercise;Balance training;Neuromuscular re-education;Patient/family education;Manual  techniques;Passive range of motion;Energy conservation;Vestibular;Taping;Biofeedback;Electrical Stimulation;Iontophoresis 433mml Dexamethasone;Dry needling;Visual/perceptual remediation/compensation    PT Next Visit Plan core stability, LE strength, balance    PT Home Exercise Plan see above    Consulted and Agree with Plan of Care Patient           Patient will benefit from skilled therapeutic intervention in order to improve the following deficits and impairments:  Abnormal gait, Cardiopulmonary status limiting activity, Decreased activity tolerance, Decreased balance, Decreased knowledge of precautions, Decreased endurance, Decreased coordination, Decreased cognition, Decreased knowledge of use of DME, Decreased mobility, Difficulty walking, Decreased safety awareness, Decreased strength, Impaired flexibility, Impaired perceived functional ability, Impaired sensation, Postural dysfunction, Improper body mechanics, Pain, Impaired vision/preception, Decreased range of motion  Visit Diagnosis: Unsteadiness on feet  Muscle weakness (generalized)  Other abnormalities of gait and mobility  Chronic low back pain, unspecified back pain laterality, unspecified whether sciatica present     Problem List Patient Active Problem List   Diagnosis Date Noted  . Hematuria   . Acute kidney injury superimposed on CKD ([place]  . Sepsis due to gram-negative UTI (HCIrvine04/26/2021  . Left nephrolithiasis 12/26/2019  . Hydronephrosis, left 12/26/2019  . Status post abdominal aortic aneurysm (AAA) repair 04/15/2019  . Candidiasis 02/22/2019  . Malignant neoplasm of descending colon ([place]  . Benign neoplasm of descending colon   . Benign neoplasm of transverse  colon   . Do not intubate, cardiopulmonary resuscitation (CPR)-only code status 10/08/2018  . Prostate cancer ([place]) 02/25/2018  . Prostatic cyst 01/15/2018  . 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 ([place]) 09/15/2017  . Blood in stool   . Abnormal  feces   . Stricture and stenosis of esophagus   . Mild cognitive impairment 08/18/2017  . Senile purpura ([place]) 04/29/2017  . Anemia 04/29/2017  . Frequent falls 04/29/2017  . Simple chronic bronchitis ([place]) 12/17/2016  . Benign localized hyperplasia of prostate with urinary obstruction 07/15/2016  . Elevated PSA 07/15/2016  . Incomplete emptying of bladder 07/15/2016  . Urge incontinence 07/15/2016  . Hypothyroidism 04/25/2016  . Macular degeneration 04/25/2016  . Erectile dysfunction due to arterial insufficiency   . Ischemic heart disease with chronotropic incompetence   . CN (constipation) 02/10/2015  . DD (diverticular disease) 02/10/2015  . Weakness 02/10/2015  . Lichen planus 49/67/5916  . Restless leg 02/10/2015  . 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 B12 deficiency 02/10/2015  . COPD (chronic obstructive pulmonary disease) ([place]) 11/14/2014  . Post Inflammatory Lung Changes 11/14/2014  . Acute delirium 11/08/2014  . PAF (paroxysmal atrial fibrillation) (HCC) CHA2DS2-VASc = 4. AC = Eliquis   . Insomnia 12/09/2013  . OSA on CPAP   . Dyspnea on exertion - -essentially resolved with BB dose reduction & wgt loss 03/29/2013  . Overweight (BMI 25.0-29.9) -- Wgt back up 01/17/2013  . Atherosclerotic heart disease of native coronary artery without angina pectoris - s/p CABG, occluded SVG-D1; patent LIMA-LAD, SVG-OM, SVG- RCA   . Essential hypertension   . Hyperlipidemia with target LDL less than 70   . Aorto-iliac disease ([place])   . BCC (basal cell carcinoma), eyelid 01/03/2013  . Allergic rhinitis 08/17/2009  . Adaptation reaction 05/28/2009  . Acid reflux 05/28/2009  . Arthritis, degenerative 05/28/2009  . 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 Carotid artery occlusion without infarction 06/01/2006  . PAD (peripheral artery disease) - bilateral common iliac stents 12/15/2005  . Atherosclerotic heart disease of artery bypass graft 09/01/1997   Janna Arch, PT,  DPT   05/01/2020, 12:39 PM  [place] MAIN [facility] SERVICES [address] [place], [place], [address] Phone: [phone]   Fax:  [phone]  Name: DANDRA VELARDI MRN: [id number] Date of Birth: November 10, 1933

## 2020-05-02 ENCOUNTER — Other Ambulatory Visit: Payer: Self-pay

## 2020-05-02 ENCOUNTER — Encounter: Payer: Medicare Other | Attending: Internal Medicine | Admitting: Internal Medicine

## 2020-05-02 DIAGNOSIS — S41112A Laceration without foreign body of left upper arm, initial encounter: Secondary | ICD-10-CM | POA: Diagnosis not present

## 2020-05-02 DIAGNOSIS — L98498 Non-pressure chronic ulcer of skin of other sites with other specified severity: Secondary | ICD-10-CM | POA: Diagnosis not present

## 2020-05-02 DIAGNOSIS — Z87442 Personal history of urinary calculi: Secondary | ICD-10-CM | POA: Diagnosis not present

## 2020-05-02 DIAGNOSIS — F0280 Dementia in other diseases classified elsewhere without behavioral disturbance: Secondary | ICD-10-CM | POA: Insufficient documentation

## 2020-05-02 DIAGNOSIS — G3183 Dementia with Lewy bodies: Secondary | ICD-10-CM | POA: Insufficient documentation

## 2020-05-02 DIAGNOSIS — S40812A Abrasion of left upper arm, initial encounter: Secondary | ICD-10-CM | POA: Diagnosis not present

## 2020-05-02 DIAGNOSIS — I10 Essential (primary) hypertension: Secondary | ICD-10-CM | POA: Diagnosis not present

## 2020-05-02 DIAGNOSIS — Z923 Personal history of irradiation: Secondary | ICD-10-CM | POA: Diagnosis not present

## 2020-05-02 DIAGNOSIS — X58XXXA Exposure to other specified factors, initial encounter: Secondary | ICD-10-CM | POA: Diagnosis not present

## 2020-05-03 ENCOUNTER — Other Ambulatory Visit: Payer: Self-pay | Admitting: Family Medicine

## 2020-05-03 ENCOUNTER — Other Ambulatory Visit: Payer: Self-pay | Admitting: Urology

## 2020-05-03 ENCOUNTER — Ambulatory Visit: Payer: Medicare Other | Attending: Nurse Practitioner

## 2020-05-03 DIAGNOSIS — R2689 Other abnormalities of gait and mobility: Secondary | ICD-10-CM | POA: Diagnosis present

## 2020-05-03 DIAGNOSIS — M545 Low back pain, unspecified: Secondary | ICD-10-CM

## 2020-05-03 DIAGNOSIS — R2681 Unsteadiness on feet: Secondary | ICD-10-CM | POA: Diagnosis present

## 2020-05-03 DIAGNOSIS — C61 Malignant neoplasm of prostate: Secondary | ICD-10-CM

## 2020-05-03 DIAGNOSIS — G8929 Other chronic pain: Secondary | ICD-10-CM | POA: Diagnosis present

## 2020-05-03 DIAGNOSIS — K219 Gastro-esophageal reflux disease without esophagitis: Secondary | ICD-10-CM

## 2020-05-03 DIAGNOSIS — N3941 Urge incontinence: Secondary | ICD-10-CM

## 2020-05-03 DIAGNOSIS — M6281 Muscle weakness (generalized): Secondary | ICD-10-CM

## 2020-05-03 DIAGNOSIS — E785 Hyperlipidemia, unspecified: Secondary | ICD-10-CM

## 2020-05-03 NOTE — Therapy (Signed)
[place] MAIN [facility] SERVICES [address] [place], [place], [address] Phone: [phone]   Fax:  [phone]  Physical Therapy Treatment  Patient Details  Name: [name] MRN: [id number] Date of Birth: [birth year] Referring Provider (PT): Meeler, Sherren Kerns FNP   Encounter Date: 05/03/2020   PT End of Session - 05/03/20 1732    Visit Number 15    Number of Visits 28    Date for PT Re-Evaluation 06/19/20    Authorization Type 5/10 PN 04/17/20    PT Start Time 0930    PT Stop Time 1014    PT Time Calculation (min) 44 min    Equipment Utilized During Treatment Gait belt    Activity Tolerance Patient tolerated treatment well;Patient limited by fatigue    Behavior During Therapy [facility] for tasks assessed/performed           Past Medical History:  Diagnosis Date  . AAA (abdominal aortic aneurysm) ([place]) 05/2019   Relativley Rapid progression from June-Aug 2020 (4.6 - 5.4 cm): s/p EVAR (Dr. [facility]): EVAR - 23 mm prox, 12 cm distal & x 18 cm length Gore Excluder Endoprosthesis Main body (via RFA distal to lowest Renal A); 73m x 14 cm L Contralateral Limb for L Iliac - extended with 8 mm x 6cm LifeStar stent (post-dilated with 7 mm DEB).  Additional R Iliac PTA not required.   . Arthritis   . Basal cell carcinoma of eyelid 01/03/2013   2019 - R side of Nose  . Benign neoplasm of ascending colon   . Benign neoplasm of descending colon   . Bilateral iliac artery stenosis ([place] 2007   Bilateral Iliac A stenting; extension of EVAR limbs into both Iliacs -- additional stent placed in L Iliac.  . Cancer ([place]    Skin cancer  . Colon polyps   . Complication of anesthesia    severe confusion agitation requiring hospital admission x 4 days  . Compression fracture of fourth lumbar vertebra ([place] 04/2019   - s/p Kyphoplasty; T12, L3-5 ([place]  . COPD (chronic obstructive pulmonary disease) (HCC)    Centrilobular Emphysema  . Coronary artery  disease involving native coronary artery without angina pectoris 1998   - s/p CABG, occluded SVG-D1; patent LIMA-LAD, SVG-OM, SVG- RCA  . Dementia ([place]    memory loss - started noting late 2019  . Falls frequently    broke foot  . Fracture 2021   left foot  . GERD (gastroesophageal reflux disease)   . Hemorrhoid 02/10/2015  . History of hiatal hernia   . History of kidney stones   . HOH (hard of hearing)   . Hypertension   . Lewy body dementia ([place] 04/04/2020  . Macular degeneration disease   . Osteoporosis   . PAF (paroxysmal atrial fibrillation) ([place]    on Eliquis for OAC - Rate Control   . Prostate cancer ([place]   . Prostate disorder   . Sciatic pain    Chronic  . Sleep apnea    cpap  . Temporary cerebral vascular dysfunction 02/10/2015   Had negative work-up.  July, 2012.  No medication changes, had forgot them that day, got dehydrated.   . Urinary incontinence     Past Surgical History:  Procedure Laterality Date  . ABDOMINAL AORTIC ENDOVASCULAR STENT GRAFT Bilateral 05/18/2019   Procedure: ABDOMINAL AORTIC ENDOVASCULAR STENT GRAFT;  Surgeon: DAlgernon Huxley MD;  Location: [facility] ORS;  Service: Vascular;  Laterality: Bilateral;  .  ANKLE SURGERY Left    ORIF   . Arch aortogram and carotid aortogram  06/02/2006   Dr Oneida Alar did surgery  . BASAL CELL CARCINOMA EXCISION  04/2016   Dermatology  . CARDIAC CATHETERIZATION  01/30/1998    (Dr. Linard Millers): Native LAD & mRCA CTO. 90% D1. Mod Cx. SVG-D1 CTO. SVG-RCA & SVG-OM along with LIMA-LAD patent;  LV NORMAL Fxn  . CAROTID ENDARTERECTOMY Left Oct. 29, 2007   Dr. Oneida Alar:  . CATARACT EXTRACTION W/PHACO Right 03/05/2017   Procedure: CATARACT EXTRACTION PHACO AND INTRAOCULAR LENS PLACEMENT (IOC);  Surgeon: Leandrew Koyanagi, MD;  Location: [facility] ORS;  Service: Ophthalmology;  Laterality: Right;  [place] 00:58.5 AP% 10.6 CDE 6.20 Fluid Pack lot # [id number]  . CATARACT EXTRACTION W/PHACO Left 04/15/2017   Procedure: CATARACT EXTRACTION  PHACO AND INTRAOCULAR LENS PLACEMENT ([place])  Left;  Surgeon: Leandrew Koyanagi, MD;  Location: [place];  Service: Ophthalmology;  Laterality: Left;  . COLONOSCOPY WITH PROPOFOL N/A 09/15/2017   Procedure: COLONOSCOPY WITH PROPOFOL;  Surgeon: Lucilla Lame, MD;  Location: [facility] ENDOSCOPY;  Service: Endoscopy;  Laterality: N/A;  . COLONOSCOPY WITH PROPOFOL N/A 10/26/2018   Procedure: COLONOSCOPY WITH PROPOFOL;  Surgeon: Lucilla Lame, MD;  Location: [facility] ENDOSCOPY;  Service: Endoscopy;  Laterality: N/A;  . CORONARY ARTERY BYPASS GRAFT  1998   LIMA-LAD, SVG-OM, SVG-RPDA, SVG-DIAG  . CPET / MET  12/2012   Mild chronotropic incompetence - read 82% of predicted; also reduced effort; peak VO2 15.7 / 75% (did not reach Max effort) -- suggested ischemic response in last 1.5 minutes of exercise. Normal pulmonary function on PFTs but poor response to  . CYSTOSCOPY W/ RETROGRADES Left 01/13/2020   Procedure: CYSTOSCOPY WITH RETROGRADE PYELOGRAM;  Surgeon: Billey Co, MD;  Location: [facility] ORS;  Service: Urology;  Laterality: Left;  . CYSTOSCOPY WITH STENT PLACEMENT Left 12/26/2019   Procedure: CYSTOSCOPY WITH STENT PLACEMENT;  Surgeon: Billey Co, MD;  Location: [facility] ORS;  Service: Urology;  Laterality: Left;  . CYSTOSCOPY/URETEROSCOPY/HOLMIUM LASER/STENT PLACEMENT Left 01/13/2020   Procedure: CYSTOSCOPY/URETEROSCOPY/HOLMIUM LASER/STENT EXCHANGE;  Surgeon: Billey Co, MD;  Location: [facility] ORS;  Service: Urology;  Laterality: Left;  . ENDOVASCULAR REPAIR/STENT GRAFT N/A 05/18/2019   Procedure: ENDOVASCULAR REPAIR/STENT GRAFT;  Surgeon: Algernon Huxley, MD;  Location: [facility] INVASIVE CV LAB;; EVAR - 23 mm prox, 12 cm distal & x 18 cm length Gore Excluder Endoprosthesis Main body (via RFA distal to lowest Renal A); 49m x 14 cm L Contralateral Limb for L Iliac - extended with 8 mm x 6cm LifeStar stent; no additional R Iliac PTA needed.   . ESOPHAGOGASTRODUODENOSCOPY (EGD) WITH PROPOFOL N/A 09/15/2017    Procedure: ESOPHAGOGASTRODUODENOSCOPY (EGD) WITH PROPOFOL;  Surgeon: WLucilla Lame MD;  [facility];  Service: Endoscopy;  Laterality: N/A;  . FRACTURE SURGERY Right 03/19/2015   wrist  . FRACTURE SURGERY Left   . HIATAL HERNIA REPAIR    . ILIAC ARTERY STENT  12/15/2005   PTA and direct stenting rgt and lft common iliac arteries  . KYPHOPLASTY N/A 04/05/2019   Procedure: KYPHOPLASTY T12, L3, L4 L5;  Surgeon: MHessie Knows MD;  Location: [facility] ORS;  Service: Orthopedics;  Laterality: N/A;  . NM GATED MYOVIEW ([place])  05/'19; 8/'20   a) [facility]: Low Risk - no ischemia or infarct.  EF > 65%. no RWMA;; b) ([facility]: Normal wall motion.  No ischemia or infarction.  . OPEN REDUCTION INTERNAL FIXATION (ORIF) DISTAL RADIAL FRACTURE Left 01/13/2019   Procedure:  OPEN REDUCTION INTERNAL FIXATION (ORIF) DISTAL  RADIAL FRACTURE - LEFT - SLEEP APNEA;  Surgeon: Hessie Knows, MD;  Location: [facility] ORS;  Service: Orthopedics;  Laterality: Left;  . ORIF ELBOW FRACTURE Right 01/01/2017   Procedure: OPEN REDUCTION INTERNAL FIXATION (ORIF) ELBOW/OLECRANON FRACTURE;  Surgeon: Hessie Knows, MD;  Location: [facility] ORS;  Service: Orthopedics;  Laterality: Right;  . ORIF WRIST FRACTURE Right 03/19/2015   Procedure: OPEN REDUCTION INTERNAL FIXATION (ORIF) WRIST FRACTURE;  Surgeon: Hessie Knows, MD;  Location: [facility] ORS;  Service: Orthopedics;  Laterality: Right;  . SHOULDER ARTHROSCOPY Right   . THORACIC AORTA - CAROTID ANGIOGRAM  October 2007   Dr. 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 TRANSTHORACIC ECHOCARDIOGRAM  February 2016   [facility]: Normal LV function. Dilated left atrium.  [facility] Dimes ECHOCARDIOGRAM  04/2019   [facility] April 20, 2019): EF 55-60 %.  Mild LVH.  Relatively normal valves.    There were no vitals filed for this visit.   Subjective Assessment - 05/03/20 0939    Subjective Patient is  very confused. Does not remember this therapist despite knowing therapist for a long time.    Pertinent History Patient is a pleasant 86 year old who was last seen by this PT on 12/22/19; since then has been admitted to the hospital and had other medical complications including: two surgeries for kidney stones, another one for his broken L foot. Was in a boot for 6 weeks (boot came off a week ago), and procedures for his back. Has been having hallucinations during the day and night time with increased rate frequently  Pain in back is associated with weakness, had facet joint injections. He has a history of kyphoplasty to T12, L3, L4, L5 performed in August 2020 by Dr. Rudene Christians. MRI from [facility] dated 03/28/2019 report was reviewed today. Acute compression fractures are noted at T12, L3, and L4. He has facet arthropathy at the lower lumbar levels. At L3-4 he has moderate central stenosis. PMH includes AAA, arthritis, atherosclerotic heart disease (s/p CABG), benign neoplasm of ascending/descending colon, COPD, CAD, dementia, dysrhythmia, falls, GERD, hemorrhoid, HOH, HTN, PAF, prostate cancer, osteoporosis, kidney stones, macular degeneration.    Limitations Walking;Standing;House hold activities;Other (comment);Lifting;Sitting    How long can you sit comfortably? 5 minutes    How long can you stand comfortably? needs a walker now, unsteady once standing    How long can you walk comfortably? uses a walker now; can walk in house.    Diagnostic tests MRI from [facility] dated 03/28/2019 report was reviewed today. Acute compression fractures are noted at T12, L3, and L4. He has facet arthropathy at the lower lumbar levels. At L3-4 he has moderate central stenosis.    Patient Stated Goals decrease pain and improve balance.    Currently in Pain? No/denies              Vitals : 121/63    Treatment:   By support bar: cues for safety awareness, task orientation and  sequencing with close CGA    TherEx: CGA with cues for sequencing, body mechanics, and safety    Nustep Lvl 4 RPM>60 for cardiovascular challenge and musculoskeletal strengthening x 4 minutes  Standing with # 4 ankle weight: CGA for stability   -Hip extension with bilateral upper extremity support, cueing for neutral hip alignment, upright posture for optimal muscle recruitment, and sequencing, 15x each LE,  -  Hip abduction with bilateral upper extremity support, cueing for neutral foot alignment for correct muscle activation, 10x each LE -Hip flexion with bilateral upper extremity support, cueing for body mechanics, speed of muscle recruitment for optimal strengthening and stabilization 15x each LE    Seated with # 4 lb ankle weights  -Seated marches with upright posture, back away from back of chair for abdominal/trunk activation/stabilization, 10x each LE -Seated LAQ kicking soccer ball for spatial awareness, body mechanics, and sequencing, cueing for muscle activation and sequencing for neutral alignment x 4 minutes  -Seated IR/ER with cueing for stabilizing knee placement with lateral foot movement for optimal muscle recruitment, 10x each LE  Seated GTB row 15x   Neuro Re-ed: CGA with cues for sequencing, body mechanics, and safety     airex pad: modified tandem stance with 6" step 2x 60 seconds each foot placement   airex pad: 6" step toe taps SUE support 15x each LE  airex pad: static stand "parade rest" position for upright posture 2x 60 seconds    modified forward lunges with UE support. 10x each LE   10x STS with UE support   Pt educated throughout session about proper posture and technique with exercises. Improved exercise technique, movement at target joints, use of target muscles after min to mod verbal, visual, tactile cues  .                    PT Education - 05/03/20 0941    Education provided Yes    Education Details exercise technique, body  mechanics    Person(s) Educated Patient    Methods Explanation;Demonstration;Tactile cues;Verbal cues    Comprehension Verbalized understanding;Returned demonstration;Verbal cues required;Tactile cues required            PT Short Term Goals - 04/24/20 1304      PT SHORT TERM GOAL #1   Title Patient will be independent in home exercise program to improve strength/mobility for better functional independence with ADLs.    Baseline 6/29: HEP given 8/17: HEP compliant with son 8/24: compliant with son    Time 4    Period Weeks    Status Achieved    Target Date 03/27/20      PT SHORT TERM GOAL #2   Title Patient will perform 5 sit to stand transfers without UE support to increase functional mobility for carryover to community activities.    Baseline 6/29; only able to perform 2 8/17: able to perform 4    Time 4    Period Weeks    Status Partially Met    Target Date 05/22/20             PT Long Term Goals - 04/24/20 1305      PT LONG TERM GOAL #1   Title Patient will increase FOTO score to equal to or greater than 54% to demonstrate statistically significant improvement in mobility and quality of life.    Baseline 6/29: 54% 8/17: 43.6%    Time 8    Period Weeks    Status On-going    Target Date 06/19/20      PT LONG TERM GOAL #2   Title Patient will demonstrate an improved Berg Balance Score of > 36 as to demonstrate improved balance with ADLs such as sitting/standing and transfer balance and reduced fall risk.    Baseline 6/29: 30/56 8/17: 37/56    Time 8    Period Weeks    Status Achieved  PT LONG TERM GOAL #3   Title Patient will increase 10 meter walk test to >1.49ms as to improve gait speed for better community ambulation and to reduce fall risk.    Baseline 6/29: 0.529m with RW 8/17: >1.0 with rollator    Time 8    Period Weeks    Status Achieved      PT LONG TERM GOAL #4   Title Patient will increase BLE gross strength to 4+/5 as to improve functional  strength for independent gait, increased standing tolerance and increased ADL ability.    Baseline 6/29: see note 8/17: see note    Time 8    Period Weeks    Status Partially Met    Target Date 06/19/20      PT LONG TERM GOAL #5   Title Patient will be able to perform household work/ chores without increase in symptoms.    Baseline 6/29: unable to perform 8/17: limited by balance and cognition as well as pain    Time 8    Period Weeks    Status Partially Met    Target Date 06/19/20                 Plan - 05/03/20 1736    Clinical Impression Statement Patient presents with increased confusion this session requiring increased cueing, task orientation, and rest breaks. Patient frequently has limited short term memory episodes.  He would cont to benefit from skilled PT to increase strength/stability for safer ADL performance, decreased fall risk, and increased functional ability    Personal Factors and Comorbidities Age;Comorbidity 3+;Fitness;Past/Current Experience;Sex;Social Background;Time since onset of injury/illness/exacerbation;Transportation    Comorbidities AAA, arthritis, atherosclerotic heart disease, (s/p CABG), benign neoplasm of ascending/descending colon, COPD, CAD, dementia, dysrhythmia, frequent falls, GERD, hemorrhoid, HOH, HTN, PAF, prostate cancer.    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    Rehab Potential Fair    PT Frequency 2x / week    PT Duration 8 weeks    PT Treatment/Interventions ADLs/Self Care Home Management;Aquatic Therapy;Cryotherapy;Moist Heat;Traction;Ultrasound;Therapeutic activities;Functional  mobility training;Stair training;Gait training;DME Instruction;Therapeutic exercise;Balance training;Neuromuscular re-education;Patient/family education;Manual techniques;Passive range of motion;Energy conservation;Vestibular;Taping;Biofeedback;Electrical Stimulation;Iontophoresis 9m88ml Dexamethasone;Dry needling;Visual/perceptual remediation/compensation    PT Next Visit Plan core stability, LE strength, balance    PT Home Exercise Plan see above    Consulted and Agree with Plan of Care Patient           Patient will benefit from skilled therapeutic intervention in order to improve the following deficits and impairments:  Abnormal gait, Cardiopulmonary status limiting activity, Decreased activity tolerance, Decreased balance, Decreased knowledge of precautions, Decreased endurance, Decreased coordination, Decreased cognition, Decreased knowledge of use of DME, Decreased mobility, Difficulty walking, Decreased safety awareness, Decreased strength, Impaired flexibility, Impaired perceived functional ability, Impaired sensation, Postural dysfunction, Improper body mechanics, Pain, Impaired vision/preception, Decreased range of motion  Visit Diagnosis: Unsteadiness on feet  Muscle weakness (generalized)  Other abnormalities of gait and mobility  Chronic low back pain, unspecified back pain laterality, unspecified whether sciatica present     Problem List Patient Active Problem List   Diagnosis Date Noted  . Hematuria   . Acute kidney injury superimposed on CKD ([place] . Sepsis due to gram-negative UTI (HCCColumbus AFB4/26/2021  . Left nephrolithiasis 12/26/2019  . Hydronephrosis, left 12/26/2019  . Status post abdominal aortic aneurysm (AAA) repair 04/15/2019  . Candidiasis 02/22/2019  . Malignant  neoplasm of descending colon ([place])   . Benign neoplasm of descending colon   . Benign neoplasm of transverse colon   . Do not intubate, cardiopulmonary resuscitation (CPR)-only code status  10/08/2018  . Prostate cancer ([place]) 02/25/2018  . Prostatic cyst 01/15/2018  . 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 ([place]) 09/15/2017  . Blood in stool   . Abnormal feces   . Stricture and stenosis of esophagus   . Mild cognitive impairment 08/18/2017  . Senile purpura ([place]) 04/29/2017  . Anemia 04/29/2017  . Frequent falls 04/29/2017  . Simple chronic bronchitis ([place]) 12/17/2016  . Benign localized hyperplasia of prostate with urinary obstruction 07/15/2016  . Elevated PSA 07/15/2016  . Incomplete emptying of bladder 07/15/2016  . Urge incontinence 07/15/2016  . Hypothyroidism 04/25/2016  . Macular degeneration 04/25/2016  . Erectile dysfunction due to arterial insufficiency   . Ischemic heart disease with chronotropic incompetence   . CN (constipation) 02/10/2015  . DD (diverticular disease) 02/10/2015  . Weakness 02/10/2015  . Lichen planus 16/06/9603  . Restless leg 02/10/2015  . 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 B12 deficiency 02/10/2015  . COPD (chronic obstructive pulmonary disease) ([place]) 11/14/2014  . Post Inflammatory Lung Changes 11/14/2014  . Acute delirium 11/08/2014  . PAF (paroxysmal atrial fibrillation) (HCC) CHA2DS2-VASc = 4. AC = Eliquis   . Insomnia 12/09/2013  . OSA on CPAP   . Dyspnea on exertion - -essentially resolved with BB dose reduction & wgt loss 03/29/2013  . Overweight (BMI 25.0-29.9) -- Wgt back up 01/17/2013  . Atherosclerotic heart disease of native coronary artery without angina pectoris - s/p CABG, occluded SVG-D1; patent LIMA-LAD, SVG-OM, SVG- RCA   . Essential hypertension   . Hyperlipidemia with target LDL less than 70   . Aorto-iliac disease ([place])   . BCC (basal cell carcinoma), eyelid 01/03/2013  . Allergic rhinitis 08/17/2009  . Adaptation reaction 05/28/2009  . Acid reflux 05/28/2009  . Arthritis, degenerative 05/28/2009  . 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 Carotid artery occlusion without infarction 06/01/2006  .  PAD (peripheral artery disease) - bilateral common iliac stents 12/15/2005  . Atherosclerotic heart disease of artery bypass graft 09/01/1997   Janna Arch, PT, DPT   05/03/2020, 5:38 PM  [place] MAIN [facility] SERVICES [address] [place], [place], [address] Phone: [phone]   Fax:  [phone]  Name: [name] MRN: [id number] Date of Birth: November 01, 1933

## 2020-05-03 NOTE — Progress Notes (Signed)
[name] ([id number]) Visit Report for 05/02/2020 Arrival Information Details Patient Name: [name], [name] Date of Service: 05/02/2020 9:45 AM Medical Record Number: [id number] Patient Account Number: [id number] Date of Birth/Sex: [birth year] (86 y.o. M) Treating RN: Cornell Barman Primary Care Gerod Caligiuri: Lelon Huh Other Clinician: Referring Venisa Frampton: Lelon Huh Treating Carnelius Hammitt/Extender: Tito Dine in Treatment: 1 Visit Information History Since Last Visit Added or deleted any medications: No Patient Arrived: Walker Any new allergies or adverse reactions: No Arrival Time: 09:37 Had a fall or experienced change in No Accompanied By: friend activities of daily living that may affect Transfer Assistance: None risk of falls: Patient Identification Verified: Yes Signs or symptoms of abuse/neglect since last visito No Secondary Verification Process Completed: Yes Hospitalized since last visit: No Patient Requires Transmission-Based Precautions: No Implantable device outside of the clinic excluding No Patient Has Alerts: No cellular tissue based products placed in the center since last visit: Has Dressing in Place as Prescribed: Yes Pain Present Now: No Electronic Signature(s) Signed: 05/02/2020 3:55:47 PM By: Lorine Bears RCP, RRT, CHT Entered By: Lorine Bears on 05/02/2020 09:38:41 [name] ([id number]) -------------------------------------------------------------------------------- Clinic Level of Care Assessment Details Patient Name: [name] Date of Service: 05/02/2020 9:45 AM Medical Record Number: [id number] Patient Account Number: [id number] Date of Birth/Sex: 05-07-1934 (86 y.o. M) Treating RN: Grover Canavan Primary Care Toma Erichsen: Lelon Huh Other Clinician: Referring Shonta Phillis: Lelon Huh Treating Jaaliyah Lucatero/Extender: Tito Dine in Treatment: 1 Clinic Level of Care  Assessment Items TOOL 4 Quantity Score []  - Use when only an EandM is performed on FOLLOW-UP visit 0 ASSESSMENTS - Nursing Assessment / Reassessment X - Reassessment of Co-morbidities (includes updates in patient status) 1 10 X- 1 5 Reassessment of Adherence to Treatment Plan ASSESSMENTS - Wound and Skin Assessment / Reassessment X - Simple Wound Assessment / Reassessment - one wound 1 5 []  - 0 Complex Wound Assessment / Reassessment - multiple wounds []  - 0 Dermatologic / Skin Assessment (not related to wound area) ASSESSMENTS - Focused Assessment []  - Circumferential Edema Measurements - multi extremities 0 []  - 0 Nutritional Assessment / Counseling / Intervention []  - 0 Lower Extremity Assessment (monofilament, tuning fork, pulses) []  - 0 Peripheral Arterial Disease Assessment (using hand held doppler) ASSESSMENTS - Ostomy and/or Continence Assessment and Care []  - Incontinence Assessment and Management 0 []  - 0 Ostomy Care Assessment and Management (repouching, etc.) PROCESS - Coordination of Care X - Simple Patient / Family Education for ongoing care 1 15 []  - 0 Complex (extensive) Patient / Family Education for ongoing care X- 1 10 Staff obtains Programmer, systems, Records, Test Results / Process Orders []  - 0 Staff telephones HHA, Nursing Homes / Clarify orders / etc []  - 0 Routine Transfer to another Facility (non-emergent condition) []  - 0 Routine Hospital Admission (non-emergent condition) []  - 0 New Admissions / Biomedical engineer / Ordering NPWT, Apligraf, etc. []  - 0 Emergency Hospital Admission (emergent condition) X- 1 10 Simple Discharge Coordination []  - 0 Complex (extensive) Discharge Coordination PROCESS - Special Needs []  - Pediatric / Minor Patient Management 0 []  - 0 Isolation Patient Management []  - 0 Hearing / Language / Visual special needs []  - 0 Assessment of Community assistance (transportation, D/C planning, etc.) []  - 0 Additional  assistance / Altered mentation []  - 0 Support Surface(s) Assessment (bed, cushion, seat, etc.) INTERVENTIONS - Wound Cleansing / Measurement [name] ([id number]) X- 1 5 Simple Wound Cleansing - one wound []  -  0 Complex Wound Cleansing - multiple wounds X- 1 5 Wound Imaging (photographs - any number of wounds) []  - 0 Wound Tracing (instead of photographs) X- 1 5 Simple Wound Measurement - one wound []  - 0 Complex Wound Measurement - multiple wounds INTERVENTIONS - Wound Dressings []  - Small Wound Dressing one or multiple wounds 0 X- 1 15 Medium Wound Dressing one or multiple wounds []  - 0 Large Wound Dressing one or multiple wounds []  - 0 Application of Medications - topical []  - 0 Application of Medications - injection INTERVENTIONS - Miscellaneous []  - External ear exam 0 []  - 0 Specimen Collection (cultures, biopsies, blood, body fluids, etc.) []  - 0 Specimen(s) / Culture(s) sent or taken to Lab for analysis []  - 0 Patient Transfer (multiple staff / Civil Service fast streamer / Similar devices) []  - 0 Simple Staple / Suture removal (25 or less) []  - 0 Complex Staple / Suture removal (26 or more) []  - 0 Hypo / Hyperglycemic Management (close monitor of Blood Glucose) []  - 0 Ankle / Brachial Index (ABI) - do not check if billed separately X- 1 5 Vital Signs Has the patient been seen at the hospital within the last three years: Yes Total Score: 90 Level Of Care: New/Established - Level 3 Electronic Signature(s) Signed: 05/02/2020 4:49:26 PM By: Grover Canavan Entered By: Grover Canavan on 05/02/2020 10:22:05 [name] ([id number]) -------------------------------------------------------------------------------- Encounter Discharge Information Details Patient Name: [name] Date of Service: 05/02/2020 9:45 AM Medical Record Number: [id number] Patient Account Number: [id number] Date of Birth/Sex: Jan 28, 1934 (86 y.o. M) Treating RN: Grover Canavan Primary Care Rodrick Payson: Lelon Huh Other Clinician: Referring Mannie Ohlin: Lelon Huh Treating Zuzu Befort/Extender: Tito Dine in Treatment: 1 Encounter Discharge Information Items Discharge Condition: Stable Ambulatory Status: Ambulatory Discharge Destination: Home Transportation: Private Auto Accompanied By: wife Schedule Follow-up Appointment: Yes Clinical Summary of Care: Electronic Signature(s) Signed: 05/02/2020 4:49:26 PM By: Grover Canavan Entered By: Grover Canavan on 05/02/2020 10:23:30 [name] ([id number]) -------------------------------------------------------------------------------- Lower Extremity Assessment Details Patient Name: [name] Date of Service: 05/02/2020 9:45 AM Medical Record Number: [id number] Patient Account Number: [id number] Date of Birth/Sex: [birth year] (86 y.o. M) Treating RN: Grover Canavan Primary Care Bridgette Wolden: Lelon Huh Other Clinician: Referring Kealey Kemmer: Lelon Huh Treating Kamera Dubas/Extender: Ricard Dillon Weeks in Treatment: 1 Electronic Signature(s) Signed: 05/02/2020 4:49:26 PM By: Grover Canavan Entered By: Grover Canavan on 05/02/2020 09:58:13 [name] ([id number]) -------------------------------------------------------------------------------- Multi Wound Chart Details Patient Name: [name] Date of Service: 05/02/2020 9:45 AM Medical Record Number: [id number] Patient Account Number: [id number] Date of Birth/Sex: 1934-07-07 (86 y.o. M) Treating RN: Grover Canavan Primary Care Ceirra Belli: Lelon Huh Other Clinician: Referring Lizabeth Fellner: Lelon Huh Treating Darlys Buis/Extender: Tito Dine in Treatment: 1 Vital Signs Height(in): 70 Pulse(bpm): 81 Weight(lbs): 182 Blood Pressure(mmHg): 103/55 Body Mass Index(BMI): 26 Temperature(F): 97.8 Respiratory Rate(breaths/min): 16 Photos: [N/A:N/A] Wound Location: Left, Proximal Forearm  N/A N/A Wounding Event: Trauma N/A N/A Primary Etiology: Trauma, Other N/A N/A Comorbid History: Coronary Artery Disease, N/A N/A Hypertension, Received Radiation Date Acquired: 04/11/2020 N/A N/A Weeks of Treatment: 1 N/A N/A Wound Status: Open N/A N/A Measurements L x W x D (cm) 5.5x0.5x0.1 N/A N/A Area (cm) : 2.16 N/A N/A Volume (cm) : 0.216 N/A N/A % Reduction in Area: 79.80% N/A N/A % Reduction in Volume: 79.80% N/A N/A Classification: Partial Thickness N/A N/A Exudate Amount: Medium N/A N/A Exudate Type: Serosanguineous N/A N/A Exudate Color: red, brown N/A N/A Wound Margin: Distinct, outline attached  N/A N/A Granulation Amount: Small (1-33%) N/A N/A Granulation Quality: Red N/A N/A Necrotic Amount: Medium (34-66%) N/A N/A Exposed Structures: Fat Layer (Subcutaneous Tissue): N/A N/A Yes Fascia: No Tendon: No Muscle: No Joint: No Bone: No Epithelialization: Medium (34-66%) N/A N/A Treatment Notes Wound #1 (Left, Proximal Forearm) Notes Hydrofera Blue, ABD, Conform, stretch net Electronic Signature(s) KASHTEN, GOWIN ([id number]) Signed: 05/03/2020 9:14:54 AM By: Linton Ham MD Entered By: Linton Ham on 05/02/2020 10:49:27 [name] ([id number]) -------------------------------------------------------------------------------- Multi-Disciplinary Care Plan Details Patient Name: [name] Date of Service: 05/02/2020 9:45 AM Medical Record Number: [id number] Patient Account Number: [id number] Date of Birth/Sex: 03-Jun-1934 (86 y.o. M) Treating RN: Grover Canavan Primary Care Lowry Bala: Lelon Huh Other Clinician: Referring Isha Seefeld: Lelon Huh Treating Cereniti Curb/Extender: Tito Dine in Treatment: 1 Active Inactive Necrotic Tissue Nursing Diagnoses: Impaired tissue integrity related to necrotic/devitalized tissue Goals: Necrotic/devitalized tissue will be minimized in the wound bed Date Initiated: 04/25/2020 Target  Resolution Date: 05/02/2020 Goal Status: Active Interventions: Assess patient pain level pre-, during and post procedure and prior to discharge Treatment Activities: Excisional debridement : 04/25/2020 Notes: Wound/Skin Impairment Nursing Diagnoses: Impaired tissue integrity Goals: Ulcer/skin breakdown will have a volume reduction of 30% by week 4 Date Initiated: 04/25/2020 Target Resolution Date: 05/26/2020 Goal Status: Active Interventions: Assess ulceration(s) every visit Treatment Activities: Topical wound management initiated : 04/25/2020 Notes: Electronic Signature(s) Signed: 05/02/2020 4:49:26 PM By: Grover Canavan Entered By: Grover Canavan on 05/02/2020 10:19:40 [name] ([id number]) -------------------------------------------------------------------------------- Pain Assessment Details Patient Name: [name] Date of Service: 05/02/2020 9:45 AM Medical Record Number: [id number] Patient Account Number: [id number] Date of Birth/Sex: 1934/06/08 (87 y.o. M) Treating RN: Grover Canavan Primary Care Zuri Bradway: Lelon Huh Other Clinician: Referring Maricia Scotti: Lelon Huh Treating Rhina Kramme/Extender: Ricard Dillon Weeks in Treatment: 1 Active Problems Location of Pain Severity and Description of Pain Patient Has Paino No Site Locations Pain Management and Medication Current Pain Management: Electronic Signature(s) Signed: 05/02/2020 4:49:26 PM By: Grover Canavan Entered By: Grover Canavan on 05/02/2020 09:49:54 [name] ([id number]) -------------------------------------------------------------------------------- Patient/Caregiver Education Details Patient Name: [name] Date of Service: 05/02/2020 9:45 AM Medical Record Number: [id number] Patient Account Number: [id number] Date of Birth/Gender: 1934-07-05 (86 y.o. M) Treating RN: Grover Canavan Primary Care Physician: Lelon Huh Other Clinician: Referring Physician:  Lelon Huh Treating Physician/Extender: Tito Dine in Treatment: 1 Education Assessment Education Provided To: Patient Education Topics Provided Wound/Skin Impairment: Handouts: Caring for Your Ulcer, Other: Continue wound care as prescribed Methods: Demonstration, Explain/Verbal Responses: State content correctly Electronic Signature(s) Signed: 05/02/2020 4:49:26 PM By: Grover Canavan Entered By: Grover Canavan on 05/02/2020 10:22:45 [name] ([id number]) -------------------------------------------------------------------------------- Wound Assessment Details Patient Name: [name] Date of Service: 05/02/2020 9:45 AM Medical Record Number: [id number] Patient Account Number: [id number] Date of Birth/Sex: 09-07-1933 (86 y.o. M) Treating RN: Grover Canavan Primary Care Shanterria Franta: Lelon Huh Other Clinician: Referring Brennden Masten: Lelon Huh Treating Krysta Bloomfield/Extender: Ricard Dillon Weeks in Treatment: 1 Wound Status Wound Number: 1 Primary Etiology: Trauma, Other Wound Location: Left, Proximal Forearm Wound Status: Open Wounding Event: Trauma Comorbid Coronary Artery Disease, Hypertension, Received History: Radiation Date Acquired: 04/11/2020 Weeks Of Treatment: 1 Clustered Wound: No Photos Wound Measurements Length: (cm) 5.5 % Re Width: (cm) 0.5 % Re Depth: (cm) 0.1 Epit Area: (cm) 2.16 Tun Volume: (cm) 0.216 Und duction in Area: 79.8% duction in Volume: 79.8% helialization: Medium (34-66%) neling: No ermining: No Wound Description Classification: Partial Thickness Foul Wound Margin: Distinct, outline attached  Slou Exudate Amount: Medium Exudate Type: Serosanguineous Exudate Color: red, brown Odor After Cleansing: No gh/Fibrino No Wound Bed Granulation Amount: Small (1-33%) Exposed Structure Granulation Quality: Red Fascia Exposed: No Necrotic Amount: Medium (34-66%) Fat Layer (Subcutaneous Tissue) Exposed:  Yes Necrotic Quality: Adherent Slough Tendon Exposed: No Muscle Exposed: No Joint Exposed: No Bone Exposed: No Treatment Notes Wound #1 (Left, Proximal Forearm) Notes Hydrofera Blue, ABD, Conform, stretch net Electronic Signature(s) Signed: 05/02/2020 4:49:26 PM By: Bosie Helper, Eugene Garnet ([id number]) Entered By: Grover Canavan on 05/02/2020 09:57:21 [name] ([id number]) -------------------------------------------------------------------------------- Vitals Details Patient Name: [name] Date of Service: 05/02/2020 9:45 AM Medical Record Number: [id number] Patient Account Number: [id number] Date of Birth/Sex: 1933/12/03 (86 y.o. M) Treating RN: Cornell Barman Primary Care Esvin Hnat: Lelon Huh Other Clinician: Referring Abrea Henle: Lelon Huh Treating Reshanda Lewey/Extender: Tito Dine in Treatment: 1 Vital Signs Time Taken: 09:38 Temperature (F): 97.8 Height (in): 70 Pulse (bpm): 82 Weight (lbs): 182 Respiratory Rate (breaths/min): 16 Body Mass Index (BMI): 26.1 Blood Pressure (mmHg): 103/55 Reference Range: 80 - 120 mg / dl Electronic Signature(s) Signed: 05/02/2020 3:55:47 PM By: Lorine Bears RCP, RRT, CHT Entered By: Becky Sax, Amado Nash on 05/02/2020 09:42:27

## 2020-05-03 NOTE — Progress Notes (Signed)
[name] ([id number]) Visit Report for 05/02/2020 HPI Details Patient Name: [name], [name] Date of Service: 05/02/2020 9:45 AM Medical Record Number: [id number] Patient Account Number: [id number] Date of Birth/Sex: [birth year] (86 y.o. M) Treating RN: Cornell Barman Primary Care Provider: Lelon Huh Other Clinician: Referring Provider: Lelon Huh Treating Provider/Extender: Tito Dine in Treatment: 1 History of Present Illness HPI Description: ADMISSION 04/25/2020 This is an 86 year old man arrives in clinic accompanied by a caregiver. He has a history of recently diagnosed Lewy body dementia with multiple falls. On 8/11 he was going to the bathroom at night stumbled into the door frame traumatizing his left dorsal forearm just below the elbow. Sizable skin tear. He was seen at urgent care in [place]. He was also seen again on 8/15 at the same location. He has been using Neosporin and Telfa. They are able to show me pictures on cell phone this is obviously gotten quite a bit smaller. The patient lives in his own home but has 24/7 care provided by the patient's son, in-home caregivers as well as the neighbor who accompanies him today. Past medical history includes Lewy body dementia recently diagnosed by neurology, gait instability, fracture of the foot and nephrolithiasis 45/68; 86 year old man with Lewy body dementia gait imbalance and falls. Substantial skin tear to the left upper arm requiring admission to our clinic last week. We debrided this extensively added Hydrofera Blue and he is considerably better this week Electronic Signature(s) Signed: 05/03/2020 9:14:54 AM By: Linton Ham MD Entered By: Linton Ham on 05/02/2020 10:50:09 [name] ([id number]) -------------------------------------------------------------------------------- Physical Exam Details Patient Name: [name] Date of Service: 05/02/2020 9:45 AM Medical Record Number:  [id number] Patient Account Number: [id number] Date of Birth/Sex: 04/04/1934 (86 y.o. M) Treating RN: Cornell Barman Primary Care Provider: Lelon Huh Other Clinician: Referring Provider: Lelon Huh Treating Provider/Extender: Ricard Dillon Weeks in Treatment: 1 Constitutional Sitting or standing Blood Pressure is within target range for patient.. Pulse regular and within target range for patient.[name] Respirations regular, non- labored and within target range.. Temperature is normal and within the target range for the patient.[name] appears in no distress. Notes Wound exam; the wound areas on the left upper arm. Vigorously debrided with saline and gauze. This cleans up quite nicely of the substantial wound last time he really only has 3 small open areas that remain. There is no surrounding infection. Pedal pulses are palpable. No lymphadenopathy. Electronic Signature(s) Signed: 05/03/2020 9:14:54 AM By: Linton Ham MD Entered By: Linton Ham on 05/02/2020 10:51:14 [name] ([id number]) -------------------------------------------------------------------------------- Physician Orders Details Patient Name: [name] Date of Service: 05/02/2020 9:45 AM Medical Record Number: [id number] Patient Account Number: [id number] Date of Birth/Sex: 1934/03/11 (86 y.o. M) Treating RN: Grover Canavan Primary Care Provider: Lelon Huh Other Clinician: Referring Provider: Lelon Huh Treating Provider/Extender: Tito Dine in Treatment: 1 Verbal / Phone Orders: No Diagnosis Coding Wound Cleansing Wound #1 Left,Proximal Forearm o Clean wound with Normal Saline. o Dial antibacterial soap, wash wounds, rinse and pat dry prior to dressing wounds Anesthetic (add to Medication List) Wound #1 Left,Proximal Forearm o Topical Lidocaine 4% cream applied to wound bed prior to debridement (In Clinic Only). Primary Wound Dressing Wound #1 Left,Proximal  Forearm o Hydrafera Blue Ready Transfer Secondary Dressing Wound #1 Left,Proximal Forearm o ABD and Kerlix/Conform - stretch net to secure Dressing Change Frequency Wound #1 Left,Proximal Forearm o Change Dressing Monday, Wednesday, Friday Follow-up Appointments Wound #1 Left,Proximal Forearm o Return Appointment  in 1 week. Home Health Wound #1 Left,Proximal Forearm o [place] Visits - [place] Nurse may visit PRN to address patientos wound care needs. o FACE TO FACE ENCOUNTER: MEDICARE and MEDICAID PATIENTS: I certify that this patient is under my care and that I had a face-to-face encounter that meets the physician face-to-face encounter requirements with this patient on this date. The encounter with the patient was in whole or in part for the following MEDICAL CONDITION: (primary reason for [place]) MEDICAL NECESSITY: I certify, that based on my findings, NURSING services are a medically necessary home health service. HOME BOUND STATUS: I certify that my clinical findings support that this patient is homebound (i.e., Due to illness or injury, pt requires aid of supportive devices such as crutches, cane, wheelchairs, walkers, the use of special transportation or the assistance of another person to leave their place of residence. There is a normal inability to leave the home and doing so requires considerable and taxing effort. Other absences are for medical reasons / religious services and are infrequent or of short duration when for other reasons). o If current dressing causes regression in wound condition, may D/C ordered dressing product/s and apply Normal Saline Moist Dressing daily until next [place] / Other MD appointment. [place] of regression in wound condition at [phone]. o Please direct any NON-WOUND related issues/requests for orders to patient's Primary Care Physician Electronic  Signature(s) Signed: 05/02/2020 4:49:26 PM By: Grover Canavan Signed: 05/03/2020 9:14:54 AM By: Linton Ham MD Entered By: Grover Canavan on 05/02/2020 10:21:21 [name] ([id number]) -------------------------------------------------------------------------------- Problem List Details Patient Name: [name] Date of Service: 05/02/2020 9:45 AM Medical Record Number: [id number] Patient Account Number: [id number] Date of Birth/Sex: April 12, 1934 (86 y.o. 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 M) Treating RN: Cornell Barman Primary Care Provider: Lelon Huh Other Clinician: Referring Provider: Lelon Huh Treating Provider/Extender: Tito Dine in Treatment: 1 Subjective History of Present Illness (HPI) ADMISSION 04/25/2020 This is an 86 year old man arrives in clinic accompanied by a caregiver. He has a history of recently diagnosed Lewy body dementia with multiple falls. On 8/11 he was going to the bathroom at night stumbled into the  door frame traumatizing his left dorsal forearm just below the elbow. Sizable skin tear. He was seen at urgent care in [place]. He was also seen again on 8/15 at the same location. He has been using Neosporin and Telfa. They are able to show me pictures on cell phone this is obviously gotten quite a bit smaller. The patient lives in his own home but has 24/7 care provided by the patient's son, in-home caregivers as well as the neighbor who accompanies him today. Past medical history includes Lewy body dementia recently diagnosed by neurology, gait instability,  fracture of the foot and nephrolithiasis 17/16; 86 year old man with Lewy body dementia gait imbalance and falls. Substantial skin tear to the left upper arm requiring admission to our clinic last week. We debrided this extensively added Hydrofera Blue and he is considerably better this week Objective Constitutional Sitting or standing Blood Pressure is within target range for patient.. Pulse regular and within target range for patient.[name] Respirations regular, non- labored and within target range.. Temperature is normal and within the target range for the patient.[name] appears in no distress. Vitals Time Taken: 9:38 AM, Height: 70 in, Weight: 182 lbs, BMI: 26.1, Temperature: 97.8 F, Pulse: 82 bpm, Respiratory Rate: 16 breaths/min, Blood Pressure: 103/55 mmHg. General Notes: Wound exam; the wound areas on the left upper arm. Vigorously debrided with saline and gauze. This cleans up quite nicely of the substantial wound last time he really only has 3 small open areas that remain. There is no surrounding infection. Pedal pulses are palpable. No lymphadenopathy. Integumentary (Hair, Skin) Wound #1 status is Open. Original cause of wound was Trauma. The wound is located on the Left,Proximal Forearm. The wound measures 5.5cm length x 0.5cm width x 0.1cm depth; 2.16cm^2 area and 0.216cm^3 volume. There is Fat Layer (Subcutaneous Tissue) exposed. There is  no tunneling or undermining noted. There is a medium amount of serosanguineous drainage noted. The wound margin is distinct with the outline attached to the wound base. There is small (1-33%) red granulation within the wound bed. There is a medium (34-66%) amount of necrotic tissue within the wound bed including Adherent Slough. Assessment Active Problems ICD-10 Abrasion of left upper arm, subsequent encounter Non-pressure chronic ulcer of skin of other sites with other specified severity Dementia with Lewy bodies [place]WU, [place] ([id number]) Plan Wound Cleansing: Wound #1 Left,Proximal Forearm: Clean wound with Normal Saline. Dial antibacterial soap, wash wounds, rinse and pat dry prior to dressing wounds Anesthetic (add to Medication List): Wound #1 Left,Proximal Forearm: Topical Lidocaine 4% cream applied to wound bed prior to debridement (In Clinic Only). Primary Wound Dressing: Wound #1 Left,Proximal Forearm: Hydrafera Blue Ready Transfer Secondary Dressing: Wound #1 Left,Proximal Forearm: ABD and Kerlix/Conform - stretch net to secure Dressing Change Frequency: Wound #1 Left,Proximal Forearm: Change Dressing Monday, Wednesday, Friday Follow-up Appointments: Wound #1 Left,Proximal Forearm: Return Appointment in 1 week. Home Health: Wound #1 Left,Proximal Forearm: Continue Home Health Visits - [place] Nurse may visit PRN to address patient s wound care needs. FACE TO FACE ENCOUNTER: MEDICARE and MEDICAID PATIENTS: I certify that this patient is under my care and that I had a face-to-face encounter that meets the physician face-to-face encounter requirements with this patient on this date. The encounter with the patient was in whole or in part for the following MEDICAL CONDITION: (primary reason for [place]) MEDICAL NECESSITY: I certify, that based on my findings, NURSING services are a medically necessary home health service. HOME BOUND STATUS: I  certify that my clinical findings support that this patient is homebound (i.e., Due to illness or injury, pt requires aid of supportive devices such as crutches, cane, wheelchairs, walkers, the use of special transportation or the assistance of another person to leave their place of residence. There is a normal inability to leave the home and doing so requires considerable and taxing effort. Other absences are for medical reasons / religious services and are infrequent or of short duration when for other reasons). If current dressing causes regression in wound condition, may D/C ordered dressing product/s and apply Normal  Saline Moist Dressing daily until next [place] / Other MD appointment. [place] of regression in wound condition at [phone]. Please direct any NON-WOUND related issues/requests for orders to patient's Primary Care Physician #1 I do not think there is any reason to change the dressing which is likely Hydrofera Blue based with gauze and net. This is being changed 3 times a week. 2. He should be clean healed by next week 3. He had quite a bit of surface eschar today however it washed off nicely with saline and gauze. No mechanical debridement was necessary Electronic Signature(s) Signed: 05/03/2020 9:14:54 AM By: Linton Ham MD Entered By: Linton Ham on 05/02/2020 10:52:36 [name] ([id number]) -------------------------------------------------------------------------------- SuperBill Details Patient Name: [name] Date of Service: 05/02/2020 Medical Record Number: [id number] Patient Account Number: [id number] Date of Birth/Sex: [birth year] (86 y.o. M) Treating RN: Grover Canavan Primary Care Provider: Lelon Huh Other Clinician: Referring Provider: Lelon Huh Treating Provider/Extender: Ricard Dillon Weeks in Treatment: 1 Diagnosis Coding ICD-10 Codes Code Description [phone] Abrasion of left upper  arm, subsequent encounter L98.498 Non-pressure chronic ulcer of skin of other sites with other specified severity G31.83 Dementia with Lewy bodies Facility Procedures CPT4 Code: [id number] Description: 99213 - WOUND CARE VISIT-LEV 3 EST PT Modifier: Quantity: 1 Physician Procedures CPT4 Code: 3532992 Description: 42683 - WC PHYS LEVEL 3 - EST PT Modifier: Quantity: 1 CPT4 Code: Description: ICD-10 Diagnosis Description S40.812D Abrasion of left upper arm, subsequent encounter L98.498 Non-pressure chronic ulcer of skin of other sites with other specified s G31.83 Dementia with Lewy bodies Modifier: everity Quantity: Electronic Signature(s) Signed: 05/03/2020 9:14:54 AM By: Linton Ham MD Entered By: Linton Ham on 05/02/2020 10:52:57

## 2020-05-08 ENCOUNTER — Other Ambulatory Visit: Payer: Self-pay

## 2020-05-08 ENCOUNTER — Ambulatory Visit: Payer: Medicare Other

## 2020-05-08 DIAGNOSIS — R2681 Unsteadiness on feet: Secondary | ICD-10-CM

## 2020-05-08 DIAGNOSIS — M545 Low back pain, unspecified: Secondary | ICD-10-CM

## 2020-05-08 DIAGNOSIS — M6281 Muscle weakness (generalized): Secondary | ICD-10-CM

## 2020-05-08 DIAGNOSIS — R2689 Other abnormalities of gait and mobility: Secondary | ICD-10-CM

## 2020-05-08 DIAGNOSIS — G8929 Other chronic pain: Secondary | ICD-10-CM

## 2020-05-08 NOTE — Therapy (Signed)
[place] [facility]IN [facility] SERVICES [address] [place], [place], [address] Phone: [phone]   Fax:  [phone]  Patient Details  Name: [name] MRN: [id number] Date of Birth: Feb 02, 1934 Referring Provider:  Harvest Dark, FNP  Encounter Date: 05/08/2020  Vitals: R arm 81/28 seated 78/43 seated 74/39 in standing:   Patient and patient's aide educated on need to go to emergency room due to diastolic and systolic pressures being outside therapeutic and safe range. Aide called son who is POA, son declined emergency room, wanting to call physician and have patient return home. Patient and aide educated on FAST symptoms and to return to hospital if any changes. Aide additionally educated on need to monitor pressure. Encouraged emergency room without success.   Janna Arch, PT, DPT   05/08/2020, 9:46 AM  [place] [facility] SERVICES [address] [place], [place], [address] Phone: [phone]   Fax:  [phone]

## 2020-05-09 ENCOUNTER — Other Ambulatory Visit: Payer: Self-pay

## 2020-05-09 ENCOUNTER — Encounter: Payer: Medicare Other | Admitting: Internal Medicine

## 2020-05-09 DIAGNOSIS — L98498 Non-pressure chronic ulcer of skin of other sites with other specified severity: Secondary | ICD-10-CM | POA: Diagnosis not present

## 2020-05-10 ENCOUNTER — Ambulatory Visit: Payer: Medicare Other

## 2020-05-10 DIAGNOSIS — R2681 Unsteadiness on feet: Secondary | ICD-10-CM | POA: Diagnosis not present

## 2020-05-10 DIAGNOSIS — G8929 Other chronic pain: Secondary | ICD-10-CM

## 2020-05-10 DIAGNOSIS — R2689 Other abnormalities of gait and mobility: Secondary | ICD-10-CM

## 2020-05-10 DIAGNOSIS — M6281 Muscle weakness (generalized): Secondary | ICD-10-CM

## 2020-05-10 DIAGNOSIS — M545 Low back pain, unspecified: Secondary | ICD-10-CM

## 2020-05-10 NOTE — Progress Notes (Signed)
[name] ([id number]) Visit Report for 05/09/2020 HPI Details Patient Name: [name], [name] Date of Service: 05/09/2020 9:00 AM Medical Record Number: [id number] Patient Account Number: [id number] Date of Birth/Sex: May 31, 1934 (86 y.o. M) Treating RN: Cornell Barman Primary Care Provider: Lelon Huh Other Clinician: Referring Provider: Lelon Huh Treating Provider/Extender: Tito Dine in Treatment: 2 History of Present Illness HPI Description: ADMISSION 04/25/2020 This is an 86 year old man arrives in clinic accompanied by a caregiver. He has a history of recently diagnosed Lewy body dementia with multiple falls. On 8/11 he was going to the bathroom at night stumbled into the door frame traumatizing his left dorsal forearm just below the elbow. Sizable skin tear. He was seen at urgent care in [place]. He was also seen again on 8/15 at the same location. He has been using Neosporin and Telfa. They are able to show me pictures on cell phone this is obviously gotten quite a bit smaller. The patient lives in his own home but has 24/7 care provided by the patient's son, in-home caregivers as well as the neighbor who accompanies him today. Past medical history includes Lewy body dementia recently diagnosed by neurology, gait instability, fracture of the foot and nephrolithiasis 15/10; 86 year old man with Lewy body dementia gait imbalance and falls. Substantial skin tear to the left upper arm requiring admission to our clinic last week. We debrided this extensively added Hydrofera Blue and he is considerably better this week 9/8; patient with a large skin tear on his left upper arm. We have been using Hydrofera Blue this is just about closed Electronic Signature(s) Signed: 05/09/2020 3:44:02 PM By: Linton Ham MD Entered By: Linton Ham on 05/09/2020 09:15:24 [name]  ([id number]) -------------------------------------------------------------------------------- Physical Exam Details Patient Name: [name] Date of Service: 05/09/2020 9:00 AM Medical Record Number: [id number] Patient Account Number: [id number] Date of Birth/Sex: [birth year] (86 y.o. M) Treating RN: Cornell Barman Primary Care Provider: Lelon Huh Other Clinician: Referring Provider: Lelon Huh Treating Provider/Extender: Ricard Dillon Weeks in Treatment: 2 Constitutional Sitting or standing Blood Pressure is within target range for patient.. Pulse regular and within target range for patient.[name] Respirations regular, non- labored and within target range.. Temperature is normal and within the target range for the patient.[name] appears in no distress. Notes Wound exam; the major area on the left upper arm. A few small open areas are not fully epithelialized but the vast majority is. Radial and brachial pulses are palpable Electronic Signature(s) Signed: 05/09/2020 3:44:02 PM By: Linton Ham MD Entered By: Linton Ham on 05/09/2020 [place], [place] ([id number]) -------------------------------------------------------------------------------- Physician Orders Details Patient Name: [name] Date of Service: 05/09/2020 9:00 AM Medical Record Number: [id number] Patient Account Number: [id number] Date of Birth/Sex: [birth year] (86 y.o. M) Treating RN: Cornell Barman Primary Care Provider: Lelon Huh Other Clinician: Referring Provider: Lelon Huh Treating Provider/Extender: Tito Dine in Treatment: 2 Verbal / Phone Orders: No Diagnosis Coding Wound Cleansing Wound #1 Left,Proximal Forearm o Clean wound with Normal Saline. o Dial antibacterial soap, wash wounds, rinse and pat dry prior to dressing wounds Anesthetic (add to Medication List) Wound #1 Left,Proximal Forearm o Topical Lidocaine 4% cream applied to wound bed prior to  debridement (In Clinic Only). Skin Barriers/Peri-Wound Care o Moisturizing lotion - on healed, dry areas Primary Wound Dressing Wound #1 Left,Proximal Forearm o Hydrafera Blue Ready Transfer Secondary Dressing Wound #1 Left,Proximal Forearm o ABD and Kerlix/Conform - stretch net to secure Dressing Change Frequency Wound #1 Left,Proximal  Forearm o Change Dressing Monday, Wednesday, Friday Follow-up Appointments Wound #1 Left,Proximal Forearm o Return Appointment in 2 weeks. - Patient will call if healed before the 2 weeks. Home Health Wound #1 Left,Proximal Forearm o [place] Visits - [place] Nurse may visit PRN to address patientos wound care needs. o FACE TO FACE ENCOUNTER: MEDICARE and MEDICAID PATIENTS: I certify that this patient is under my care and that I had a face-to-face encounter that meets the physician face-to-face encounter requirements with this patient on this date. The encounter with the patient was in whole or in part for the following MEDICAL CONDITION: (primary reason for [place]) MEDICAL NECESSITY: I certify, that based on my findings, NURSING services are a medically necessary home health service. HOME BOUND STATUS: I certify that my clinical findings support that this patient is homebound (i.e., Due to illness or injury, pt requires aid of supportive devices such as crutches, cane, wheelchairs, walkers, the use of special transportation or the assistance of another person to leave their place of residence. There is a normal inability to leave the home and doing so requires considerable and taxing effort. Other absences are for medical reasons / religious services and are infrequent or of short duration when for other reasons). o If current dressing causes regression in wound condition, may D/C ordered dressing product/s and apply Normal Saline Moist Dressing daily until next [place] / Other MD  appointment. [place] of regression in wound condition at [phone]. o Please direct any NON-WOUND related issues/requests for orders to patient's Primary Care Physician Electronic Signature(s) Signed: 05/09/2020 3:44:02 PM By: Linton Ham MD Signed: 05/09/2020 6:08:23 PM By: Gretta Cool, BSN, RN, CWS, Kim RN, BSN Entered By: Gretta Cool, BSN, RN, CWS, Kim on 05/09/2020 09:08:47 [name] ([id number]) [name] ([id number]) -------------------------------------------------------------------------------- Problem List Details Patient Name: [name] Date of Service: 05/09/2020 9:00 AM Medical Record Number: [id number] Patient Account Number: [id number] Date of Birth/Sex: [birth year] (86 y.o. 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 M) Treating RN: Cornell Barman Primary Care Provider: Lelon Huh Other Clinician: Referring Provider: Lelon Huh Treating Provider/Extender: Tito Dine in Treatment: 2 Subjective History of  Present Illness (HPI) ADMISSION 04/25/2020 This is an 86 year old man arrives in clinic accompanied by a caregiver. He has a history of recently diagnosed Lewy body dementia with multiple falls. On 8/11 he was going to the bathroom at night stumbled into the door frame traumatizing his left dorsal forearm just below the elbow. Sizable skin tear. He was seen at urgent care in [place]. He was also seen again on 8/15 at the same location. He has been using Neosporin and Telfa. They are able to show me pictures on cell phone this is obviously gotten quite a bit smaller. The patient  lives in his own home but has 24/7 care provided by the patient's son, in-home caregivers as well as the neighbor who accompanies him today. Past medical history includes Lewy body dementia recently diagnosed by neurology, gait instability, fracture of the foot and nephrolithiasis 40/3; 86 year old man with Lewy body dementia gait imbalance and falls. Substantial skin tear to the left upper arm requiring admission to our clinic last week. We debrided this extensively added Hydrofera Blue and he is considerably better this week 9/8; patient with a large skin tear on his left upper arm. We have been using Hydrofera Blue this is just about closed Objective Constitutional Sitting or standing Blood Pressure is within target range for patient.. Pulse regular and within target range for patient.[name] Respirations regular, non- labored and within target range.. Temperature is normal and within the target range for the patient.[name] appears in no distress. Vitals Time Taken: 8:50 AM, Height: 70 in, Weight: 182 lbs, BMI: 26.1, Temperature: 97.6 F, Pulse: 70 bpm, Respiratory Rate: 16 breaths/min, Blood Pressure: 119/58 mmHg. General Notes: Wound exam; the major area on the left upper arm. A few small open areas are not fully epithelialized but the vast majority is. Radial and brachial pulses are palpable Integumentary (Hair, Skin) Wound #1  status is Open. Original cause of wound was Trauma. The wound is located on the Left,Proximal Forearm. The wound measures 5.5cm length x 0.5cm width x 0.1cm depth; 2.16cm^2 area and 0.216cm^3 volume. There is Fat Layer (Subcutaneous Tissue) exposed. There is a none present amount of drainage noted. The wound margin is distinct with the outline attached to the wound base. There is small (1-33%) red granulation within the wound bed. There is a medium (34-66%) amount of necrotic tissue within the wound bed including Adherent Slough. General Notes: wound measuring the same today, but dry and scabbed over. No drainage or pain. Site looks good. Assessment Active Problems ICD-10 Abrasion of left upper arm, subsequent encounter Non-pressure chronic ulcer of skin of other sites with other specified severity Dementia with Lewy bodies [name], [name] ([id number]) Plan Wound Cleansing: Wound #1 Left,Proximal Forearm: Clean wound with Normal Saline. Dial antibacterial soap, wash wounds, rinse and pat dry prior to dressing wounds Anesthetic (add to Medication List): Wound #1 Left,Proximal Forearm: Topical Lidocaine 4% cream applied to wound bed prior to debridement (In Clinic Only). Skin Barriers/Peri-Wound Care: Moisturizing lotion - on healed, dry areas Primary Wound Dressing: Wound #1 Left,Proximal Forearm: Hydrafera Blue Ready Transfer Secondary Dressing: Wound #1 Left,Proximal Forearm: ABD and Kerlix/Conform - stretch net to secure Dressing Change Frequency: Wound #1 Left,Proximal Forearm: Change Dressing Monday, Wednesday, Friday Follow-up Appointments: Wound #1 Left,Proximal Forearm: Return Appointment in 2 weeks. - Patient will call if healed before the 2 weeks. Home Health: Wound #1 Left,Proximal Forearm: Continue Home Health Visits - [place] Nurse may visit PRN to address patient s wound care needs. FACE TO FACE ENCOUNTER: MEDICARE and MEDICAID PATIENTS: I certify  that this patient is under my care and that I had a face-to-face encounter that meets the physician face-to-face encounter requirements with this patient on this date. The encounter with the patient was in whole or in part for the following MEDICAL CONDITION: (primary reason for [place]) MEDICAL NECESSITY: I certify, that based on my findings, NURSING services are a medically necessary home health service. HOME BOUND STATUS: I certify that my clinical findings support that this patient is homebound (i.e., Due to illness or injury, pt requires aid of supportive devices such  as crutches, cane, wheelchairs, walkers, the use of special transportation or the assistance of another person to leave their place of residence. There is a normal inability to leave the home and doing so requires considerable and taxing effort. Other absences are for medical reasons / religious services and are infrequent or of short duration when for other reasons). If current dressing causes regression in wound condition, may D/C ordered dressing product/s and apply Normal Saline Moist Dressing daily until next [place] / Other MD appointment. [place] of regression in wound condition at [phone]. Please direct any NON-WOUND related issues/requests for orders to patient's Primary Care Physician 1. We will continue to use the [facility] that they already have at home 2. Gave him an appointment follow-up for 2 weeks but truthfully this really could be totally healed by that time. I have asked his wife to call us and let us know that this is healed so we can heal him out in our system. Electronic Signature(s) Signed: 05/09/2020 3:44:02 PM By: Linton Ham MD Entered By: Linton Ham on 05/09/2020 09:17:14 [name] ([id number]) -------------------------------------------------------------------------------- [place] Details Patient Name: [name] Date of  Service: 05/09/2020 Medical Record Number: [id number] Patient Account Number: [id number] Date of Birth/Sex: 05/11/1934 (86 y.o. M) Treating RN: Cornell Barman Primary Care Provider: Lelon Huh Other Clinician: Referring Provider: Lelon Huh Treating Provider/Extender: Tito Dine in Treatment: 2 Diagnosis Coding ICD-10 Codes Code Description [phone] Abrasion of left upper arm, subsequent encounter L98.498 Non-pressure chronic ulcer of skin of other sites with other specified severity G31.83 Dementia with Lewy bodies Facility Procedures CPT4 Code: [id number] Description: [id number] - WOUND CARE VISIT-LEV 2 EST PT Modifier: Quantity: 1 Physician Procedures CPT4 Code: [id number] Description: [id number] - WC PHYS LEVEL 2 - EST PT Modifier: Quantity: 1 CPT4 Code: Description: ICD-10 Diagnosis Description S40.812D Abrasion of left upper arm, subsequent encounter L98.498 Non-pressure chronic ulcer of skin of other sites with other specified s Modifier: everity Quantity: Electronic Signature(s) Signed: 05/09/2020 3:44:02 PM By: Linton Ham MD Entered By: Linton Ham on 05/09/2020 09:17:30

## 2020-05-10 NOTE — Progress Notes (Addendum)
[name] ([id number]) Visit Report for 05/09/2020 Arrival Information Details Patient Name: [name], [name] Date of Service: 05/09/2020 9:00 AM Medical Record Number: [id number] Patient Account Number: [id number] Date of Birth/Sex: 04-Jan-1934 (86 y.o. M) Treating RN: Cornell Barman Primary Care Anabia Weatherwax: Lelon Huh Other Clinician: Referring Laqueshia Cihlar: Lelon Huh Treating Takenya Travaglini/Extender: Tito Dine in Treatment: 2 Visit Information History Since Last Visit Added or deleted any medications: No Patient Arrived: Walker Any new allergies or adverse reactions: No Arrival Time: 08:53 Had a fall or experienced change in No Accompanied By: wife activities of daily living that may affect Transfer Assistance: None risk of falls: Patient Identification Verified: Yes Signs or symptoms of abuse/neglect since last visito No Secondary Verification Process Completed: Yes Hospitalized since last visit: No Patient Requires Transmission-Based Precautions: No Implantable device outside of the clinic excluding No Patient Has Alerts: No cellular tissue based products placed in the center since last visit: Has Dressing in Place as Prescribed: Yes Pain Present Now: No Electronic Signature(s) Signed: 05/09/2020 3:31:30 PM By: Lorine Bears RCP, RRT, CHT Entered By: Lorine Bears on 05/09/2020 08:54:07 [name] ([id number]) -------------------------------------------------------------------------------- Clinic Level of Care Assessment Details Patient Name: [name] Date of Service: 05/09/2020 9:00 AM Medical Record Number: [id number] Patient Account Number: [id number] Date of Birth/Sex: 12-28-1933 (86 y.o. M) Treating RN: Cornell Barman Primary Care Tiarna Koppen: Lelon Huh Other Clinician: Referring Reita Shindler: Lelon Huh Treating Vrishank Moster/Extender: Tito Dine in Treatment: 2 Clinic Level of Care Assessment  Items TOOL 4 Quantity Score []  - Use when only an EandM is performed on FOLLOW-UP visit 0 ASSESSMENTS - Nursing Assessment / Reassessment X - Reassessment of Co-morbidities (includes updates in patient status) 1 10 X- 1 5 Reassessment of Adherence to Treatment Plan ASSESSMENTS - Wound and Skin Assessment / Reassessment X - Simple Wound Assessment / Reassessment - one wound 1 5 []  - 0 Complex Wound Assessment / Reassessment - multiple wounds []  - 0 Dermatologic / Skin Assessment (not related to wound area) ASSESSMENTS - Focused Assessment []  - Circumferential Edema Measurements - multi extremities 0 []  - 0 Nutritional Assessment / Counseling / Intervention []  - 0 Lower Extremity Assessment (monofilament, tuning fork, pulses) []  - 0 Peripheral Arterial Disease Assessment (using hand held doppler) ASSESSMENTS - Ostomy and/or Continence Assessment and Care []  - Incontinence Assessment and Management 0 []  - 0 Ostomy Care Assessment and Management (repouching, etc.) PROCESS - Coordination of Care X - Simple Patient / Family Education for ongoing care 1 15 []  - 0 Complex (extensive) Patient / Family Education for ongoing care []  - 0 Staff obtains Programmer, systems, Records, Test Results / Process Orders []  - 0 Staff telephones HHA, Nursing Homes / Clarify orders / etc []  - 0 Routine Transfer to another Facility (non-emergent condition) []  - 0 Routine Hospital Admission (non-emergent condition) []  - 0 New Admissions / Biomedical engineer / Ordering NPWT, Apligraf, etc. []  - 0 Emergency Hospital Admission (emergent condition) X- 1 10 Simple Discharge Coordination []  - 0 Complex (extensive) Discharge Coordination PROCESS - Special Needs []  - Pediatric / Minor Patient Management 0 []  - 0 Isolation Patient Management []  - 0 Hearing / Language / Visual special needs []  - 0 Assessment of Community assistance (transportation, D/C planning, etc.) []  - 0 Additional assistance /  Altered mentation []  - 0 Support Surface(s) Assessment (bed, cushion, seat, etc.) INTERVENTIONS - Wound Cleansing / Measurement [name] ([id number]) X- 1 5 Simple Wound Cleansing - one wound []  -  0 Complex Wound Cleansing - multiple wounds X- 1 5 Wound Imaging (photographs - any number of wounds) []  - 0 Wound Tracing (instead of photographs) X- 1 5 Simple Wound Measurement - one wound []  - 0 Complex Wound Measurement - multiple wounds INTERVENTIONS - Wound Dressings X - Small Wound Dressing one or multiple wounds 1 10 []  - 0 Medium Wound Dressing one or multiple wounds []  - 0 Large Wound Dressing one or multiple wounds []  - 0 Application of Medications - topical []  - 0 Application of Medications - injection INTERVENTIONS - Miscellaneous []  - External ear exam 0 []  - 0 Specimen Collection (cultures, biopsies, blood, body fluids, etc.) []  - 0 Specimen(s) / Culture(s) sent or taken to Lab for analysis []  - 0 Patient Transfer (multiple staff / Civil Service fast streamer / Similar devices) []  - 0 Simple Staple / Suture removal (25 or less) []  - 0 Complex Staple / Suture removal (26 or more) []  - 0 Hypo / Hyperglycemic Management (close monitor of Blood Glucose) []  - 0 Ankle / Brachial Index (ABI) - do not check if billed separately X- 1 5 Vital Signs Has the patient been seen at the hospital within the last three years: Yes Total Score: 75 Level Of Care: New/Established - Level 2 Electronic Signature(s) Signed: 05/09/2020 6:08:23 PM By: Gretta Cool, BSN, RN, CWS, Kim RN, BSN Entered By: Gretta Cool, BSN, RN, CWS, Kim on 05/09/2020 09:09:20 [name] ([id number]) -------------------------------------------------------------------------------- Encounter Discharge Information Details Patient Name: [name] Date of Service: 05/09/2020 9:00 AM Medical Record Number: [id number] Patient Account Number: [id number] Date of Birth/Sex: 01-Nov-1933 (86 y.o. M) Treating RN:  Cornell Barman Primary Care Mallie Linnemann: Lelon Huh Other Clinician: Referring Adam Sanjuan: Lelon Huh Treating Aidden Markovic/Extender: Tito Dine in Treatment: 2 Encounter Discharge Information Items Discharge Condition: Stable Ambulatory Status: Walker Discharge Destination: Home Transportation: Private Auto Accompanied By: wife Schedule Follow-up Appointment: Yes Clinical Summary of Care: Electronic Signature(s) Signed: 05/09/2020 6:08:23 PM By: Gretta Cool, BSN, RN, CWS, Kim RN, BSN Entered By: Gretta Cool, BSN, RN, CWS, Kim on 05/09/2020 09:10:28 [name] ([id number]) -------------------------------------------------------------------------------- Lower Extremity Assessment Details Patient Name: [name] Date of Service: 05/09/2020 9:00 AM Medical Record Number: [id number] Patient Account Number: [id number] Date of Birth/Sex: 11-08-1933 (86 y.o. M) Treating RN: Cornell Barman Primary Care Marites Nath: Lelon Huh Other Clinician: Referring Porsche Noguchi: Lelon Huh Treating Beckhem Isadore/Extender: Tito Dine in Treatment: 2 Electronic Signature(s) Signed: 05/09/2020 11:06:52 AM By: Darci Needle Signed: 05/09/2020 6:08:23 PM By: Gretta Cool BSN, RN, CWS, Kim RN, BSN Entered By: Darci Needle on 05/09/2020 09:00:37 [name] ([id number]) -------------------------------------------------------------------------------- Multi Wound Chart Details Patient Name: [name] Date of Service: 05/09/2020 9:00 AM Medical Record Number: [id number] Patient Account Number: [id number] Date of Birth/Sex: 18-Jan-1934 (86 y.o. M) Treating RN: Cornell Barman Primary Care Kellen Dutch: Lelon Huh Other Clinician: Referring Gerber Penza: Lelon Huh Treating Arles Rumbold/Extender: Tito Dine in Treatment: 2 Vital Signs Height(in): 70 Pulse(bpm): 74 Weight(lbs): 182 Blood Pressure(mmHg): 119/58 Body Mass Index(BMI): 26 Temperature(F): 97.6 Respiratory  Rate(breaths/min): 16 Photos: [N/A:N/A] Wound Location: Left, Proximal Forearm N/A N/A Wounding Event: Trauma N/A N/A Primary Etiology: Trauma, Other N/A N/A Comorbid History: Coronary Artery Disease, N/A N/A Hypertension, Received Radiation Date Acquired: 04/11/2020 N/A N/A Weeks of Treatment: 2 N/A N/A Wound Status: Open N/A N/A Measurements L x W x D (cm) 5.5x0.5x0.1 N/A N/A Area (cm) : 2.16 N/A N/A Volume (cm) : 0.216 N/A N/A % Reduction in Area: 79.80% N/A N/A % Reduction in Volume:  79.80% N/A N/A Classification: Partial Thickness N/A N/A Exudate Amount: None Present N/A N/A Wound Margin: Distinct, outline attached N/A N/A Granulation Amount: Small (1-33%) N/A N/A Granulation Quality: Red N/A N/A Necrotic Amount: Medium (34-66%) N/A N/A Exposed Structures: Fat Layer (Subcutaneous Tissue): N/A N/A Yes Fascia: No Tendon: No Muscle: No Joint: No Bone: No Epithelialization: Medium (34-66%) N/A N/A Assessment Notes: wound measuring the same today, N/A N/A but dry and scabbed over. No drainage or pain. Site looks good. Treatment Notes Wound #1 (Left, Proximal Forearm) Notes Hydrofera Blue, ABD, Conform, stretch net DUILIO, HERITAGE ([id number]) Electronic Signature(s) Signed: 05/09/2020 3:44:02 PM By: Linton Ham MD Entered By: Linton Ham on 05/09/2020 09:14:46 [name] ([id number]) -------------------------------------------------------------------------------- [place] Details Patient Name: [name] Date of Service: 05/09/2020 9:00 AM Medical Record Number: [id number] Patient Account Number: [id number] Date of Birth/Sex: 14-Mar-1934 (86 y.o. M) Treating RN: Cornell Barman Primary Care Chesney Klimaszewski: Lelon Huh Other Clinician: Referring Liberato Stansbery: Lelon Huh Treating Akeiba Axelson/Extender: Tito Dine in Treatment: 2 Active Inactive Electronic Signature(s) Signed: 05/28/2020 10:09:58 AM By: Gretta Cool, BSN, RN, CWS,  Kim RN, BSN Previous Signature: 05/09/2020 6:08:23 PM Version By: Gretta Cool, BSN, RN, CWS, Kim RN, BSN Entered By: Gretta Cool, BSN, RN, CWS, Kim on 05/28/2020 10:09:57 [name] ([id number]) -------------------------------------------------------------------------------- Pain Assessment Details Patient Name: [name] Date of Service: 05/09/2020 9:00 AM Medical Record Number: [id number] Patient Account Number: [id number] Date of Birth/Sex: [birth year] (86 y.o. M) Treating RN: Cornell Barman Primary Care Ariday Brinker: Lelon Huh Other Clinician: Referring Panagiota Perfetti: Lelon Huh Treating Jaevon Paras/Extender: Tito Dine in Treatment: 2 Active Problems Location of Pain Severity and Description of Pain Patient Has Paino No Site Locations Pain Management and Medication Current Pain Management: Notes Patient denies pain at this time. Electronic Signature(s) Signed: 05/09/2020 6:08:23 PM By: Gretta Cool, BSN, RN, CWS, Kim RN, BSN Entered By: Gretta Cool, BSN, RN, CWS, Kim on 05/09/2020 09:05:31 [name] ([id number]) -------------------------------------------------------------------------------- Patient/Caregiver Education Details Patient Name: [name] Date of Service: 05/09/2020 9:00 AM Medical Record Number: [id number] Patient Account Number: [id number] Date of Birth/Gender: 06-23-1934 (86 y.o. M) Treating RN: Cornell Barman Primary Care Physician: Lelon Huh Other Clinician: Referring Physician: Lelon Huh Treating Physician/Extender: Tito Dine in Treatment: 2 Education Assessment Education Provided To: Patient Education Topics Provided Wound/Skin Impairment: Handouts: Other: continue wound care as prescribed Methods: Demonstration, Explain/Verbal Responses: State content correctly Electronic Signature(s) Signed: 05/09/2020 6:08:23 PM By: Gretta Cool, BSN, RN, CWS, Kim RN, BSN Entered By: Gretta Cool, BSN, RN, CWS, Kim on 05/09/2020 09:09:54 [name] ([id number]) -------------------------------------------------------------------------------- Wound Assessment Details Patient Name: [name] Date of Service: 05/09/2020 9:00 AM Medical Record Number: [id number] Patient Account Number: [id number] Date of Birth/Sex: 1934/02/26 (86 y.o. M) Treating RN: Cornell Barman Primary Care Takuya Lariccia: Lelon Huh Other Clinician: Referring Leroy Trim: Lelon Huh Treating Darcie Mellone/Extender: Ricard Dillon Weeks in Treatment: 2 Wound Status Wound Number: 1 Primary Etiology: Trauma, Other Wound Location: Left, Proximal Forearm Wound Status: Open Wounding Event: Trauma Comorbid Coronary Artery Disease, Hypertension, Received History: Radiation Date Acquired: 04/11/2020 Weeks Of Treatment: 2 Clustered Wound: No Photos Wound Measurements Length: (cm) 5.5 Width: (cm) 0.5 Depth: (cm) 0.1 Area: (cm) 2.16 Volume: (cm) 0.216 % Reduction in Area: 79.8% % Reduction in Volume: 79.8% Epithelialization: Medium (34-66%) Wound Description Classification: Partial Thickness Wound Margin: Distinct, outline attached Exudate Amount: None Present Foul Odor After Cleansing: No Slough/Fibrino No Wound Bed Granulation Amount: Small (1-33%) Exposed Structure Granulation Quality: Red Fascia Exposed: No  Necrotic Amount: Medium (34-66%) Fat Layer (Subcutaneous Tissue) Exposed: Yes Necrotic Quality: Adherent Slough Tendon Exposed: No Muscle Exposed: No Joint Exposed: No Bone Exposed: No Assessment Notes wound measuring the same today, but dry and scabbed over. No drainage or pain. Site looks good. Electronic Signature(s) Signed: 05/09/2020 11:06:52 AM By: Darci Needle Signed: 05/09/2020 6:08:23 PM By: Gretta Cool, BSN, RN, CWS, Kim RN, BSN Entered By: Darci Needle on 05/09/2020 09:00:28 [name] ([id number]) -------------------------------------------------------------------------------- [place] Details Patient Name: [name] Date of Service: 05/09/2020 9:00 AM Medical Record Number: [id number] Patient Account Number: [id number] Date of Birth/Sex: 03-Dec-1933 (86 y.o. M) Treating RN: Cornell Barman Primary Care Corley Maffeo: Lelon Huh Other Clinician: Referring Naika Noto: Lelon Huh Treating Britain Saber/Extender: Tito Dine in Treatment: 2 Vital Signs Time Taken: 08:50 Temperature (F): 97.6 Height (in): 70 Pulse (bpm): 70 Weight (lbs): 182 Respiratory Rate (breaths/min): 16 Body Mass Index (BMI): 26.1 Blood Pressure (mmHg): 119/58 Reference Range: 80 - 120 mg / dl Electronic Signature(s) Signed: 05/09/2020 3:31:30 PM By: Lorine Bears RCP, RRT, CHT Entered By: Lorine Bears on 05/09/2020 08:54:31

## 2020-05-10 NOTE — Therapy (Signed)
[place] MAIN [facility] SERVICES [address] [place], [place], [address] Phone: [phone]   Fax:  [phone]  Physical Therapy Treatment  Patient Details  Name: [name] MRN: [id number] Date of Birth: 1933-12-31 Referring Provider (PT): Meeler, Sherren Kerns FNP   Encounter Date: 05/10/2020   PT End of Session - 05/10/20 0938    Visit Number 16    Number of Visits 28    Date for PT Re-Evaluation 06/19/20    Authorization Type 6/10 PN 04/17/20    PT Start Time 0930    PT Stop Time 1014    PT Time Calculation (min) 44 min    Equipment Utilized During Treatment Gait belt    Activity Tolerance Patient tolerated treatment well;Patient limited by fatigue    Behavior During Therapy [facility] for tasks assessed/performed           Past Medical History:  Diagnosis Date  . AAA (abdominal aortic aneurysm) ([place]) 05/2019   Relativley Rapid progression from June-Aug 2020 (4.6 - 5.4 cm): s/p EVAR (Dr. Lucky Cowboy [facility]): EVAR - 23 mm prox, 12 cm distal & x 18 cm length Gore Excluder Endoprosthesis Main body (via RFA distal to lowest Renal A); 49m x 14 cm L Contralateral Limb for L Iliac - extended with 8 mm x 6cm LifeStar stent (post-dilated with 7 mm DEB).  Additional R Iliac PTA not required.   . Arthritis   . Basal cell carcinoma of eyelid 01/03/2013   2019 - R side of Nose  . Benign neoplasm of ascending colon   . Benign neoplasm of descending colon   . Bilateral iliac artery stenosis ([place] 2007   Bilateral Iliac A stenting; extension of EVAR limbs into both Iliacs -- additional stent placed in L Iliac.  . Cancer ([place]    Skin cancer  . Colon polyps   . Complication of anesthesia    severe confusion agitation requiring hospital admission x 4 days  . Compression fracture of fourth lumbar vertebra ([place] 04/2019   - s/p Kyphoplasty; T12, L3-5 ([facility]  . COPD (chronic obstructive pulmonary disease) (HCC)    Centrilobular Emphysema  . Coronary artery  disease involving native coronary artery without angina pectoris 1998   - s/p CABG, occluded SVG-D1; patent LIMA-LAD, SVG-OM, SVG- RCA  . Dementia ([place]    memory loss - started noting late 2019  . Falls frequently    broke foot  . Fracture 2021   left foot  . GERD (gastroesophageal reflux disease)   . Hemorrhoid 02/10/2015  . History of hiatal hernia   . History of kidney stones   . HOH (hard of hearing)   . Hypertension   . Lewy body dementia ([place] 04/04/2020  . Macular degeneration disease   . Osteoporosis   . PAF (paroxysmal atrial fibrillation) ([place]    on Eliquis for OAC - Rate Control   . Prostate cancer ([place]   . Prostate disorder   . Sciatic pain    Chronic  . Sleep apnea    cpap  . Temporary cerebral vascular dysfunction 02/10/2015   Had negative work-up.  July, 2012.  No medication changes, had forgot them that day, got dehydrated.   . Urinary incontinence     Past Surgical History:  Procedure Laterality Date  . ABDOMINAL AORTIC ENDOVASCULAR STENT GRAFT Bilateral 05/18/2019   Procedure: ABDOMINAL AORTIC ENDOVASCULAR STENT GRAFT;  Surgeon: DAlgernon Huxley MD;  Location: [facility] ORS;  Service: Vascular;  Laterality: Bilateral;  .  ANKLE SURGERY Left    ORIF   . Arch aortogram and carotid aortogram  06/02/2006   Dr Oneida Alar did surgery  . BASAL CELL CARCINOMA EXCISION  04/2016   Dermatology  . CARDIAC CATHETERIZATION  01/30/1998    (Dr. Linard Millers): Native LAD & mRCA CTO. 90% D1. Mod Cx. SVG-D1 CTO. SVG-RCA & SVG-OM along with LIMA-LAD patent;  LV NORMAL Fxn  . CAROTID ENDARTERECTOMY Left Oct. 29, 2007   Dr. Oneida Alar:  . CATARACT EXTRACTION W/PHACO Right 03/05/2017   Procedure: CATARACT EXTRACTION PHACO AND INTRAOCULAR LENS PLACEMENT (IOC);  Surgeon: Leandrew Koyanagi, MD;  Location: [facility] ORS;  Service: Ophthalmology;  Laterality: Right;  [place] 00:58.5 AP% 10.6 CDE 6.20 Fluid Pack lot # [id number]  . CATARACT EXTRACTION W/PHACO Left 04/15/2017   Procedure: CATARACT EXTRACTION  PHACO AND INTRAOCULAR LENS PLACEMENT ([place])  Left;  Surgeon: Leandrew Koyanagi, MD;  Location: [place];  Service: Ophthalmology;  Laterality: Left;  . COLONOSCOPY WITH PROPOFOL N/A 09/15/2017   Procedure: COLONOSCOPY WITH PROPOFOL;  Surgeon: Lucilla Lame, MD;  Location: [facility] ENDOSCOPY;  Service: Endoscopy;  Laterality: N/A;  . COLONOSCOPY WITH PROPOFOL N/A 10/26/2018   Procedure: COLONOSCOPY WITH PROPOFOL;  Surgeon: Lucilla Lame, MD;  Location: [facility] ENDOSCOPY;  Service: Endoscopy;  Laterality: N/A;  . CORONARY ARTERY BYPASS GRAFT  1998   LIMA-LAD, SVG-OM, SVG-RPDA, SVG-DIAG  . CPET / MET  12/2012   Mild chronotropic incompetence - read 82% of predicted; also reduced effort; peak VO2 15.7 / 75% (did not reach Max effort) -- suggested ischemic response in last 1.5 minutes of exercise. Normal pulmonary function on PFTs but poor response to  . CYSTOSCOPY W/ RETROGRADES Left 01/13/2020   Procedure: CYSTOSCOPY WITH RETROGRADE PYELOGRAM;  Surgeon: Billey Co, MD;  Location: [facility] ORS;  Service: Urology;  Laterality: Left;  . CYSTOSCOPY WITH STENT PLACEMENT Left 12/26/2019   Procedure: CYSTOSCOPY WITH STENT PLACEMENT;  Surgeon: Billey Co, MD;  Location: [facility] ORS;  Service: Urology;  Laterality: Left;  . CYSTOSCOPY/URETEROSCOPY/HOLMIUM LASER/STENT PLACEMENT Left 01/13/2020   Procedure: CYSTOSCOPY/URETEROSCOPY/HOLMIUM LASER/STENT EXCHANGE;  Surgeon: Billey Co, MD;  Location: [facility] ORS;  Service: Urology;  Laterality: Left;  . ENDOVASCULAR REPAIR/STENT GRAFT N/A 05/18/2019   Procedure: ENDOVASCULAR REPAIR/STENT GRAFT;  Surgeon: Algernon Huxley, MD;  Location: [facility] INVASIVE CV LAB;; EVAR - 23 mm prox, 12 cm distal & x 18 cm length Gore Excluder Endoprosthesis Main body (via RFA distal to lowest Renal A); 49m x 14 cm L Contralateral Limb for L Iliac - extended with 8 mm x 6cm LifeStar stent; no additional R Iliac PTA needed.   . ESOPHAGOGASTRODUODENOSCOPY (EGD) WITH PROPOFOL N/A 09/15/2017    Procedure: ESOPHAGOGASTRODUODENOSCOPY (EGD) WITH PROPOFOL;  Surgeon: WLucilla Lame MD;  [facility];  Service: Endoscopy;  Laterality: N/A;  . FRACTURE SURGERY Right 03/19/2015   wrist  . FRACTURE SURGERY Left   . HIATAL HERNIA REPAIR    . ILIAC ARTERY STENT  12/15/2005   PTA and direct stenting rgt and lft common iliac arteries  . KYPHOPLASTY N/A 04/05/2019   Procedure: KYPHOPLASTY T12, L3, L4 L5;  Surgeon: MHessie Knows MD;  Location: [facility] ORS;  Service: Orthopedics;  Laterality: N/A;  . NM GATED MYOVIEW ([place])  05/'19; 8/'20   a) [facility]: Low Risk - no ischemia or infarct.  EF > 65%. no RWMA;; b) ([facility]: Normal wall motion.  No ischemia or infarction.  . OPEN REDUCTION INTERNAL FIXATION (ORIF) DISTAL RADIAL FRACTURE Left 01/13/2019   Procedure:  OPEN REDUCTION INTERNAL FIXATION (ORIF) DISTAL  RADIAL FRACTURE - LEFT - SLEEP APNEA;  Surgeon: Hessie Knows, MD;  Location: [facility] ORS;  Service: Orthopedics;  Laterality: Left;  . ORIF ELBOW FRACTURE Right 01/01/2017   Procedure: OPEN REDUCTION INTERNAL FIXATION (ORIF) ELBOW/OLECRANON FRACTURE;  Surgeon: Hessie Knows, MD;  Location: [facility] ORS;  Service: Orthopedics;  Laterality: Right;  . ORIF WRIST FRACTURE Right 03/19/2015   Procedure: OPEN REDUCTION INTERNAL FIXATION (ORIF) WRIST FRACTURE;  Surgeon: Hessie Knows, MD;  Location: [facility] ORS;  Service: Orthopedics;  Laterality: Right;  . SHOULDER ARTHROSCOPY Right   . THORACIC AORTA - CAROTID ANGIOGRAM  October 2007   Dr. 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 TRANSTHORACIC ECHOCARDIOGRAM  February 2016   [facility]: Normal LV function. Dilated left atrium.  Domingo Dimes ECHOCARDIOGRAM  04/2019   [facility] April 20, 2019): EF 55-60 %.  Mild LVH.  Relatively normal valves.    There were no vitals filed for this visit.   Subjective Assessment - 05/10/20 0936    Subjective Patient  went to wound doctor yesterday for wound on L arm, reports coming friday the bandage should be coming off.    Pertinent History Patient is a pleasant 86 year old who was last seen by this PT on 12/22/19; since then has been admitted to the hospital and had other medical complications including: two surgeries for kidney stones, another one for his broken L foot. Was in a boot for 6 weeks (boot came off a week ago), and procedures for his back. Has been having hallucinations during the day and night time with increased rate frequently  Pain in back is associated with weakness, had facet joint injections. He has a history of kyphoplasty to T12, L3, L4, L5 performed in August 2020 by Dr. Rudene Christians. [facility] from [facility] dated 03/28/2019 report was reviewed today. Acute compression fractures are noted at T12, L3, and L4. He has facet arthropathy at the lower lumbar levels. At L3-4 he has moderate central stenosis. PMH includes AAA, arthritis, atherosclerotic heart disease (s/p CABG), benign neoplasm of ascending/descending colon, COPD, CAD, dementia, dysrhythmia, falls, GERD, hemorrhoid, HOH, HTN, PAF, prostate cancer, osteoporosis, kidney stones, macular degeneration.    Limitations Walking;Standing;House hold activities;Other (comment);Lifting;Sitting    How long can you sit comfortably? 5 minutes    How long can you stand comfortably? needs a walker now, unsteady once standing    How long can you walk comfortably? uses a walker now; can walk in house.    Diagnostic tests MRI from [facility] dated 03/28/2019 report was reviewed today. Acute compression fractures are noted at T12, L3, and L4. He has facet arthropathy at the lower lumbar levels. At L3-4 he has moderate central stenosis.    Patient Stated Goals decrease pain and improve balance.    Currently in Pain? No/denies             vitals at start of session : 138/64    Treatment:   By support bar: cues for safety  awareness, task orientation and sequencing with close CGA    TherEx: CGA with cues for sequencing, body mechanics, and safety      Nustep Lvl 4 RPM>60 for cardiovascular challenge and musculoskeletal strengthening x 4 minutes   Standing with # 4 ankle weight: CGA for stability   -Hip extension with bilateral upper extremity support, cueing for neutral hip alignment, upright posture  for optimal muscle recruitment, and sequencing, 15x each LE,  -Hip abduction side step  with bilateral upper extremity support, cueing for neutral foot alignment for correct muscle activation, 4x length of // bars -Hip flexion forward step marches with bilateral upper extremity support, cueing for body mechanics, speed of muscle recruitment for optimal strengthening and stabilization 4x length of // bars      Seated with # 4 lb ankle weights  -Seated marches with upright posture, back away from back of chair for abdominal/trunk activation/stabilization, 10x each LE -Seated LAQ kicking soccer ball for spatial awareness, body mechanics, and sequencing, cueing for muscle activation and sequencing for neutral alignment x 4 minutes   Seated GTB row 15x   Ambulate in gym to negotiate obstacles, awareness of surroundings, frequent running into obstacles requiring min A for guidance x 80 ft   Precor leg press: -bilateral LE press #55 12x, 2 sets min A for placement of LE's.; cues for controlled eccentric return and decreased velocity for improved muscle contraction    Seated: GTB hamstring curl 15x each LE Green ball adduction squeeze 3 second holds 10x   Neuro Re-ed: CGA with cues for sequencing, body mechanics, and safety     airex pad: stand and reach for ball, throw into hoop for pertubation 20x   Balloon taps reaching inside/outside BOS in seated for coordination, spatial awareness, and stabilization x3 minutes  10x STS with UE support   Pt educated throughout session about proper posture and technique  with exercises. Improved exercise technique, movement at target joints, use of target muscles after min to mod verbal, visual, tactile cues                      PT Education - 05/10/20 0937    Education provided Yes    Education Details exercise technique, body mechanics, task orientation    Person(s) Educated Patient    Methods Explanation;Demonstration;Tactile cues;Verbal cues    Comprehension Verbalized understanding;Returned demonstration;Verbal cues required;Tactile cues required            PT Short Term Goals - 04/24/20 1304      PT SHORT TERM GOAL #1   Title Patient will be independent in home exercise program to improve strength/mobility for better functional independence with ADLs.    Baseline 6/29: HEP given 8/17: HEP compliant with son 8/24: compliant with son    Time 4    Period Weeks    Status Achieved    Target Date 03/27/20      PT SHORT TERM GOAL #2   Title Patient will perform 5 sit to stand transfers without UE support to increase functional mobility for carryover to community activities.    Baseline 6/29; only able to perform 2 8/17: able to perform 4    Time 4    Period Weeks    Status Partially Met    Target Date 05/22/20             PT Long Term Goals - 04/24/20 1305      PT LONG TERM GOAL #1   Title Patient will increase FOTO score to equal to or greater than 54% to demonstrate statistically significant improvement in mobility and quality of life.    Baseline 6/29: 54% 8/17: 43.6%    Time 8    Period Weeks    Status On-going    Target Date 06/19/20      PT LONG TERM GOAL #2   Title Patient will  demonstrate an improved Berg Balance Score of > 36 as to demonstrate improved balance with ADLs such as sitting/standing and transfer balance and reduced fall risk.    Baseline 6/29: 30/56 8/17: 37/56    Time 8    Period Weeks    Status Achieved      PT LONG TERM GOAL #3   Title Patient will increase 10 meter walk test to >1.62ms  as to improve gait speed for better community ambulation and to reduce fall risk.    Baseline 6/29: 0.526m with RW 8/17: >1.0 with rollator    Time 8    Period Weeks    Status Achieved      PT LONG TERM GOAL #4   Title Patient will increase BLE gross strength to 4+/5 as to improve functional strength for independent gait, increased standing tolerance and increased ADL ability.    Baseline 6/29: see note 8/17: see note    Time 8    Period Weeks    Status Partially Met    Target Date 06/19/20      PT LONG TERM GOAL #5   Title Patient will be able to perform household work/ chores without increase in symptoms.    Baseline 6/29: unable to perform 8/17: limited by balance and cognition as well as pain    Time 8    Period Weeks    Status Partially Met    Target Date 06/19/20                 Plan - 05/10/20 1000    Clinical Impression Statement Patient requires frequent multimodal cueing with task orientation due to declined cognition and memory. Patient is improving in strength with increased capacity for strengthening interventions. He would cont to benefit from skilled PT to increase strength/stability for safer ADL performance, decreased fall risk, and increased functional ability    Personal Factors and Comorbidities Age;Comorbidity 3+;Fitness;Past/Current Experience;Sex;Social Background;Time since onset of injury/illness/exacerbation;Transportation    Comorbidities AAA, arthritis, atherosclerotic heart disease, (s/p CABG), benign neoplasm of ascending/descending colon, COPD, CAD, dementia, dysrhythmia, frequent falls, GERD, hemorrhoid, HOH, HTN, PAF, prostate cancer.    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/Moderate complexity    Rehab Potential Fair    PT Frequency 2x / week    PT Duration 8 weeks    PT Treatment/Interventions ADLs/Self Care Home Management;Aquatic Therapy;Cryotherapy;Moist Heat;Traction;Ultrasound;Therapeutic activities;Functional mobility training;Stair training;Gait training;DME Instruction;Therapeutic exercise;Balance training;Neuromuscular re-education;Patient/family education;Manual techniques;Passive range of motion;Energy conservation;Vestibular;Taping;Biofeedback;Electrical Stimulation;Iontophoresis 27m56ml Dexamethasone;Dry needling;Visual/perceptual remediation/compensation    PT Next Visit Plan core stability, LE strength, balance    PT Home Exercise Plan see above    Consulted and Agree with Plan of Care Patient           Patient will benefit from skilled therapeutic intervention in order to improve the following deficits and impairments:  Abnormal gait, Cardiopulmonary status limiting activity, Decreased activity tolerance, Decreased balance, Decreased knowledge of precautions, Decreased endurance, Decreased coordination, Decreased cognition, Decreased knowledge of use of DME, Decreased mobility, Difficulty walking, Decreased safety awareness, Decreased strength, Impaired flexibility, Impaired perceived functional ability, Impaired sensation, Postural dysfunction, Improper body mechanics, Pain, Impaired vision/preception, Decreased range of motion  Visit Diagnosis: Unsteadiness on feet  Muscle weakness (generalized)  Other abnormalities of gait and mobility  Chronic low back pain, unspecified back pain laterality, unspecified whether sciatica present     Problem List Patient Active Problem List  Diagnosis Date Noted  . Hematuria   . Acute kidney injury superimposed on CKD ([place])   . Sepsis due to gram-negative UTI ([place]) 12/26/2019  . Left nephrolithiasis 12/26/2019  .  Hydronephrosis, left 12/26/2019  . Status post abdominal aortic aneurysm (AAA) repair 04/15/2019  . Candidiasis 02/22/2019  . Malignant neoplasm of descending colon ([place])   . Benign neoplasm of descending colon   . Benign neoplasm of transverse colon   . Do not intubate, cardiopulmonary resuscitation (CPR)-only code status 10/08/2018  . Prostate cancer ([place]) 02/25/2018  . Prostatic cyst 01/15/2018  . 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 ([place]) 09/15/2017  . Blood in stool   . Abnormal feces   . Stricture and stenosis of esophagus   . Mild cognitive impairment 08/18/2017  . Senile purpura ([place]) 04/29/2017  . Anemia 04/29/2017  . Frequent falls 04/29/2017  . Simple chronic bronchitis ([place]) 12/17/2016  . Benign localized hyperplasia of prostate with urinary obstruction 07/15/2016  . Elevated PSA 07/15/2016  . Incomplete emptying of bladder 07/15/2016  . Urge incontinence 07/15/2016  . Hypothyroidism 04/25/2016  . Macular degeneration 04/25/2016  . Erectile dysfunction due to arterial insufficiency   . Ischemic heart disease with chronotropic incompetence   . CN (constipation) 02/10/2015  . DD (diverticular disease) 02/10/2015  . Weakness 02/10/2015  . Lichen planus 50/27/7412  . Restless leg 02/10/2015  . 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 B12 deficiency 02/10/2015  . COPD (chronic obstructive pulmonary disease) ([place]) 11/14/2014  . Post Inflammatory Lung Changes 11/14/2014  . Acute delirium 11/08/2014  . PAF (paroxysmal atrial fibrillation) (HCC) CHA2DS2-VASc = 4. AC = Eliquis   . Insomnia 12/09/2013  . OSA on CPAP   . Dyspnea on exertion - -essentially resolved with BB dose reduction & wgt loss 03/29/2013  . Overweight (BMI 25.0-29.9) -- Wgt back up 01/17/2013  . Atherosclerotic heart disease of native coronary artery without angina pectoris - s/p CABG, occluded SVG-D1; patent LIMA-LAD, SVG-OM, SVG- RCA   . Essential hypertension   . Hyperlipidemia with target LDL less than 70    . Aorto-iliac disease ([place])   . BCC (basal cell carcinoma), eyelid 01/03/2013  . Allergic rhinitis 08/17/2009  . Adaptation reaction 05/28/2009  . Acid reflux 05/28/2009  . Arthritis, degenerative 05/28/2009  . 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 Carotid artery occlusion without infarction 06/01/2006  . PAD (peripheral artery disease) - bilateral common iliac stents 12/15/2005  . Atherosclerotic heart disease of artery bypass graft 09/01/1997   Janna Arch, PT, DPT   05/10/2020, 10:14 AM  [place] MAIN [facility] SERVICES [address] [place], [place], [address] Phone: [phone]   Fax:  [phone]  Name: [name] MRN: [id number] Date of Birth: 1934-03-05

## 2020-05-14 ENCOUNTER — Ambulatory Visit (INDEPENDENT_AMBULATORY_CARE_PROVIDER_SITE_OTHER): Payer: Medicare Other | Admitting: Family Medicine

## 2020-05-14 ENCOUNTER — Other Ambulatory Visit: Payer: Self-pay

## 2020-05-14 ENCOUNTER — Ambulatory Visit: Payer: 59 | Admitting: Physician Assistant

## 2020-05-14 ENCOUNTER — Encounter: Payer: Self-pay | Admitting: Family Medicine

## 2020-05-14 VITALS — BP 133/74 | HR 69 | Temp 98.3°F | Resp 16 | Wt 180.0 lb

## 2020-05-14 DIAGNOSIS — R5383 Other fatigue: Secondary | ICD-10-CM | POA: Diagnosis not present

## 2020-05-14 DIAGNOSIS — R0989 Other specified symptoms and signs involving the circulatory and respiratory systems: Secondary | ICD-10-CM | POA: Diagnosis not present

## 2020-05-14 DIAGNOSIS — G3183 Dementia with Lewy bodies: Secondary | ICD-10-CM

## 2020-05-14 DIAGNOSIS — I2510 Atherosclerotic heart disease of native coronary artery without angina pectoris: Secondary | ICD-10-CM | POA: Diagnosis not present

## 2020-05-14 DIAGNOSIS — F0280 Dementia in other diseases classified elsewhere without behavioral disturbance: Secondary | ICD-10-CM

## 2020-05-14 DIAGNOSIS — I10 Essential (primary) hypertension: Secondary | ICD-10-CM | POA: Diagnosis not present

## 2020-05-14 MED ORDER — METOPROLOL SUCCINATE ER 50 MG PO TB24: 50.0000 mg | ORAL_TABLET | Freq: Every day | ORAL | 1 refills | Status: DC

## 2020-05-14 NOTE — Patient Instructions (Addendum)
Any of the following medications can cause abnormal drops in blood pressure:   tamsulosin  seroquel  metoprolol  lisinopril  You can stop taking the tamsulosin to see if that helps. We will also change metoprolol tartate to metoprolol succinate which is an extended release version which keeps blood pressure more stable. Start the new metoprolol succinate prescription tomorrow morning, September 14th.

## 2020-05-14 NOTE — Progress Notes (Signed)
I,Roshena L Chambers,acting as a scribe for Lelon Huh, MD.,have documented all relevant documentation on the behalf of Lelon Huh, MD,as directed by  Lelon Huh, MD while in the presence of Lelon Huh, MD.  Established patient visit   Patient: [name]   DOB: 1934/03/29   86 y.o. Male  MRN: [id number] Visit Date: 05/14/2020  Today's healthcare provider: Lelon Huh, MD   Chief Complaint  Patient presents with  . Blood Pressure Check   Subjective    HPI  Hypotension Patient presents in office today to address recent low blood pressure readings. For the past 3 weeks PT has reported that patients blood pressure readings have been low when standing and patient has not been able to proceed with PT. His blood pressure has been as low as 60/40. Patient's significant other reports his blood pressure readings are normal when she checks it at home. The low blood pressure readings only occur during his PT appointment.     Social History   Tobacco Use  . Smoking status: Former Smoker    Types: Cigarettes    Quit date: 08/04/1997    Years since quitting: 22.7  . Smokeless tobacco: Never Used  Vaping Use  . Vaping Use: Never used  Substance Use Topics  . Alcohol use: No  . Drug use: No   Allergies  Allergen Reactions  . Clonazepam Other (See Comments)    Altered mental status  . Codeine Other (See Comments)    Altered mental status.  . Ropinirole Swelling    Other reaction(s): Hallucination  . Hydrocodone     confusion  . Niacin And Related Other (See Comments)    Flushing of skin  . Oxycodone Other (See Comments)    confusion confusion  . Tramadol     Other reaction(s): Hallucination       Medications: Outpatient Medications Prior to Visit  Medication Sig  . acetaminophen (TYLENOL) 500 MG tablet Take 500-1,000 mg by mouth every 6 (six) hours as needed for mild pain or moderate pain.   . Ascorbic Acid (VITAMIN C) 1000 MG tablet Take 1,000 mg  by mouth daily.  . Cyanocobalamin (VITAMIN B-12) 500 MCG SUBL Place 500 mcg under the tongue daily.   [name] ELIQUIS 5 MG TABS tablet TAKE 1 TABLET BY MOUTH TWICE DAILY  . ezetimibe (ZETIA) 10 MG tablet TAKE 1 TABLET BY MOUTH DAILY  . finasteride (PROSCAR) 5 MG tablet TAKE ONE TABLET BY MOUTH EVERY DAY  . fluticasone (FLONASE) 50 MCG/ACT nasal spray PLACE TWO SPRAYS IN BOTH NOSRTILS EVERY DAY AS NEEDED FOR ALLERGIES  . Magnesium Oxide 500 MG TABS Take 500 mg by mouth every evening.  . Melatonin 3 MG CAPS Take 1 capsule by mouth at bedtime.  . metoprolol tartrate (LOPRESSOR) 25 MG tablet Take 1 tablet (25 mg total) by mouth 2 (two) times daily. *OFFICE VISIT NEEDED FOR FURTHER REFILLS*  . Multiple Vitamins-Minerals (PRESERVISION AREDS 2 PO) Take 1 tablet by mouth 2 (two) times daily.   [name] MYRBETRIQ 50 MG TB24 tablet TAKE ONE TABLET BY MOUTH EVERY DAY  . nitroGLYCERIN (NITROSTAT) 0.4 MG SL tablet Place 1 tablet (0.4 mg total) under the tongue every 5 (five) minutes as needed for chest pain.  . pantoprazole (PROTONIX) 40 MG tablet TAKE ONE TABLET BY MOUTH EVERY DAY  . QUEtiapine (SEROQUEL) 25 MG tablet Take 12.5 mg by mouth.   . rosuvastatin (CRESTOR) 20 MG tablet TAKE ONE TABLET BY MOUTH EVERY DAY  .  tamsulosin (FLOMAX) 0.4 MG CAPS capsule TAKE 1 CAPSULE BY MOUTH EVERY DAY IN THEEVENING  . umeclidinium-vilanterol (ANORO ELLIPTA) 62.5-25 MCG/INH AEPB Inhale 1 puff into the lungs daily.   [name] lisinopril (ZESTRIL) 2.5 MG tablet Take 1 tablet (2.5 mg total) by mouth daily.   No facility-administered medications prior to visit.    Review of Systems  Constitutional: Negative for appetite change, chills and fever.  Respiratory: Negative for chest tightness, shortness of breath and wheezing.   Cardiovascular: Negative for chest pain and palpitations.  Gastrointestinal: Negative for abdominal pain, nausea and vomiting.      Objective    BP 133/74 (BP Location: Right Arm, Patient Position: Sitting, Cuff  Size: Normal)   Pulse 69   Temp 98.3 F (36.8 C) (Oral)   Resp 16   Wt 180 lb (81.6 kg)   BMI 25.46 kg/m    Physical Exam   General: Appearance:     Well developed, well nourished male in no acute distress  Eyes:    PERRL, conjunctiva/corneas clear, EOM's intact       Lungs:     Clear to auscultation bilaterally, respirations unlabored  Heart:    Normal heart rate. Irregularly irregular rhythm. No murmurs, rubs, or gallops.   MS:   All extremities are intact.   Neurologic:   Awake, alert, oriented x 3. Unsteady gait       No results found for any visits on 05/14/20.  Assessment & Plan     1. Other fatigue  - CBC - Comprehensive metabolic panel - TSH  2. Essential hypertension Change metoprolol tartate to - metoprolol succinate (TOPROL-XL) 50 MG 24 hr tablet; Take 1 tablet (50 mg total) by mouth daily. Take with or immediately following a meal.  Dispense: 30 tablet; Refill: 1  3. Labile blood pressure He did have significant drop in SBP with standing, but HR and DBP did not change significantly. Will hold tamsulosin for the time being and change metoprolol tartate to metoprolol succinate at same daily dose. Will reassess in about 2 weeks.   4. Lewy body dementia without behavioral disturbance ([place]) Followed by Dr. Manuella Ghazi with ongoing PT. Now on Seroquel prescribed by Dr. Manuella Ghazi.        The entirety of the information documented in the History of Present Illness, Review of Systems and Physical Exam were personally obtained by me. Portions of this information were initially documented by the CMA and reviewed by me for thoroughness and accuracy.      Lelon Huh, MD  [facility] [phone] (phone) [phone] (fax)  [place]

## 2020-05-15 ENCOUNTER — Telehealth: Payer: Self-pay

## 2020-05-15 ENCOUNTER — Other Ambulatory Visit: Payer: Self-pay

## 2020-05-15 ENCOUNTER — Telehealth: Payer: Self-pay | Admitting: Family Medicine

## 2020-05-15 ENCOUNTER — Ambulatory Visit: Payer: Medicare Other

## 2020-05-15 DIAGNOSIS — M6281 Muscle weakness (generalized): Secondary | ICD-10-CM

## 2020-05-15 DIAGNOSIS — R2681 Unsteadiness on feet: Secondary | ICD-10-CM | POA: Diagnosis not present

## 2020-05-15 DIAGNOSIS — R2689 Other abnormalities of gait and mobility: Secondary | ICD-10-CM

## 2020-05-15 LAB — COMPREHENSIVE METABOLIC PANEL
ALT: 7 IU/L (ref 0–44)
AST: 12 IU/L (ref 0–40)
Albumin/Globulin Ratio: 1.7 (ref 1.2–2.2)
Albumin: 4 g/dL (ref 3.6–4.6)
Alkaline Phosphatase: 118 IU/L (ref 44–121)
BUN/Creatinine Ratio: 14 (ref 10–24)
BUN: 14 mg/dL (ref 8–27)
Bilirubin Total: 0.8 mg/dL (ref 0.0–1.2)
CO2: 22 mmol/L (ref 20–29)
Calcium: 9.1 mg/dL (ref 8.6–10.2)
Chloride: 105 mmol/L (ref 96–106)
Creatinine, Ser: 0.98 mg/dL (ref 0.76–1.27)
GFR calc Af Amer: 82 mL/min/{1.73_m2} (ref >59)
GFR calc non Af Amer: 71 mL/min/{1.73_m2} (ref >59)
Globulin, Total: 2.4 g/dL (ref 1.5–4.5)
Glucose: 91 mg/dL (ref 65–99)
Potassium: 4.2 mmol/L (ref 3.5–5.2)
Sodium: 140 mmol/L (ref 134–144)
Total Protein: 6.4 g/dL (ref 6.0–8.5)

## 2020-05-15 LAB — CBC
Hematocrit: 35 % — ABNORMAL LOW (ref 37.5–51.0)
Hemoglobin: 11.8 g/dL — ABNORMAL LOW (ref 13.0–17.7)
MCH: 31.2 pg (ref 26.6–33.0)
MCHC: 33.7 g/dL (ref 31.5–35.7)
MCV: 93 fL (ref 79–97)
Platelets: 135 10*3/uL — ABNORMAL LOW (ref 150–450)
RBC: 3.78 x10E6/uL — ABNORMAL LOW (ref 4.14–5.80)
RDW: 13.5 % (ref 11.6–15.4)
WBC: 7.2 10*3/uL (ref 3.4–10.8)

## 2020-05-15 LAB — TSH: TSH: 2.94 u[IU]/mL (ref 0.450–4.500)

## 2020-05-15 NOTE — Telephone Encounter (Unsigned)
Copied from [place] [phone]. Topic: General - Other >> May 15, 2020 10:10 AM Yvette Rack wrote: Reason for CRM: Pt spouse [name] returned call to office for lab results. [name] requests call back. Cb# [phone]

## 2020-05-15 NOTE — Telephone Encounter (Signed)
[name] given results and instructions, verbalizes understanding.

## 2020-05-15 NOTE — Telephone Encounter (Signed)
-----   Message from [name], MD sent at 05/15/2020  7:41 AM EDT ----- Labs are all normal. Go ahead and make changes to BP medications we discussed at o.v. and follow up in 2 weeks as scheduled.

## 2020-05-15 NOTE — Telephone Encounter (Signed)
Left message to call back. Ok for [facility] to advise pt as below.

## 2020-05-15 NOTE — Therapy (Signed)
[place] MAIN [facility] SERVICES [address] [place], [place], [address] Phone: [phone]   Fax:  [phone]  Physical Therapy Treatment  Patient Details  Name: [name] MRN: [id number] Date of Birth: 02-11-1934 Referring Provider (PT): Meeler, Sherren Kerns FNP   Encounter Date: 05/15/2020   PT End of Session - 05/15/20 1110    Visit Number 17    Number of Visits 28    Date for PT Re-Evaluation 06/19/20    Authorization Type 7/10 PN 04/17/20    PT Start Time 0930    PT Stop Time 1014    PT Time Calculation (min) 44 min    Equipment Utilized During Treatment Gait belt    Activity Tolerance Patient tolerated treatment well;Patient limited by fatigue    Behavior During Therapy WFL for tasks assessed/performed           Past Medical History:  Diagnosis Date  . AAA (abdominal aortic aneurysm) ([place]) 05/2019   [place] Rapid progression from June-Aug 2020 (4.6 - 5.4 cm): s/p EVAR (Dr. Lucky Cowboy [facility]): EVAR - 23 mm prox, 12 cm distal & x 18 cm length Gore Excluder Endoprosthesis Main body (via RFA distal to lowest Renal A); 53mm x 14 cm L Contralateral Limb for L Iliac - extended with 8 mm x 6cm LifeStar stent (post-dilated with 7 mm DEB).  Additional R Iliac PTA not required.   . Arthritis   . Basal cell carcinoma of eyelid 01/03/2013   2019 - R side of Nose  . Benign neoplasm of ascending colon   . Benign neoplasm of descending colon   . Bilateral iliac artery stenosis ([place]) 2007   Bilateral Iliac A stenting; extension of EVAR limbs into both Iliacs -- additional stent placed in L Iliac.  . Cancer ([place])    Skin cancer  . Colon polyps   . Complication of anesthesia    severe confusion agitation requiring hospital admission x 4 days  . Compression fracture of fourth lumbar vertebra ([place]) 04/2019   - s/p Kyphoplasty; T12, L3-5 [facility])  . COPD (chronic obstructive pulmonary disease) (HCC)    Centrilobular Emphysema  . Coronary artery  disease involving native coronary artery without angina pectoris 1998   - s/p CABG, occluded SVG-D1; patent LIMA-LAD, SVG-OM, SVG- RCA  . Dementia ([place])    memory loss - started noting late 2019  . Falls frequently    broke foot  . Fracture 2021   left foot  . GERD (gastroesophageal reflux disease)   . Hemorrhoid 02/10/2015  . History of hiatal hernia   . History of kidney stones   . HOH (hard of hearing)   . Hypertension   . Lewy body dementia ([place]) 04/04/2020  . Macular degeneration disease   . Osteoporosis   . PAF (paroxysmal atrial fibrillation) ([place])    on Eliquis for OAC - Rate Control   . Prostate cancer ([place])   . Prostate disorder   . Sciatic pain    Chronic  . Sleep apnea    cpap  . Temporary cerebral vascular dysfunction 02/10/2015   Had negative work-up.  July, 2012.  No medication changes, had forgot them that day, got dehydrated.   . Urinary incontinence     Past Surgical History:  Procedure Laterality Date  . ABDOMINAL AORTIC ENDOVASCULAR STENT GRAFT Bilateral 05/18/2019   Procedure: ABDOMINAL AORTIC ENDOVASCULAR STENT GRAFT;  Surgeon: Algernon Huxley, MD;  Location: [facility] ORS;  Service: Vascular;  Laterality: Bilateral;  .  ANKLE SURGERY Left    ORIF   . Arch aortogram and carotid aortogram  06/02/2006   Dr Oneida Alar did surgery  . BASAL CELL CARCINOMA EXCISION  04/2016   Dermatology  . CARDIAC CATHETERIZATION  01/30/1998    (Dr. Linard Millers): Native LAD & mRCA CTO. 90% D1. Mod Cx. SVG-D1 CTO. SVG-RCA & SVG-OM along with LIMA-LAD patent;  LV NORMAL Fxn  . CAROTID ENDARTERECTOMY Left Oct. 29, 2007   Dr. Oneida Alar:  . CATARACT EXTRACTION W/PHACO Right 03/05/2017   Procedure: CATARACT EXTRACTION PHACO AND INTRAOCULAR LENS PLACEMENT (IOC);  Surgeon: Leandrew Koyanagi, MD;  Location: [facility] ORS;  Service: Ophthalmology;  Laterality: Right;  [place] 00:58.5 AP% 10.6 CDE 6.20 Fluid Pack lot # [id number]  . CATARACT EXTRACTION W/PHACO Left 04/15/2017   Procedure: CATARACT EXTRACTION  PHACO AND INTRAOCULAR LENS PLACEMENT ([place])  Left;  Surgeon: Leandrew Koyanagi, MD;  Location: [place];  Service: Ophthalmology;  Laterality: Left;  . COLONOSCOPY WITH PROPOFOL N/A 09/15/2017   Procedure: COLONOSCOPY WITH PROPOFOL;  Surgeon: Lucilla Lame, MD;  Location: [facility] ENDOSCOPY;  Service: Endoscopy;  Laterality: N/A;  . COLONOSCOPY WITH PROPOFOL N/A 10/26/2018   Procedure: COLONOSCOPY WITH PROPOFOL;  Surgeon: Lucilla Lame, MD;  Location: [facility] ENDOSCOPY;  Service: Endoscopy;  Laterality: N/A;  . CORONARY ARTERY BYPASS GRAFT  1998   LIMA-LAD, SVG-OM, SVG-RPDA, SVG-DIAG  . CPET / MET  12/2012   Mild chronotropic incompetence - read 82% of predicted; also reduced effort; peak VO2 15.7 / 75% (did not reach Max effort) -- suggested ischemic response in last 1.5 minutes of exercise. Normal pulmonary function on PFTs but poor response to  . CYSTOSCOPY W/ RETROGRADES Left 01/13/2020   Procedure: CYSTOSCOPY WITH RETROGRADE PYELOGRAM;  Surgeon: Billey Co, MD;  Location: [facility] ORS;  Service: Urology;  Laterality: Left;  . CYSTOSCOPY WITH STENT PLACEMENT Left 12/26/2019   Procedure: CYSTOSCOPY WITH STENT PLACEMENT;  Surgeon: Billey Co, MD;  Location: [facility] ORS;  Service: Urology;  Laterality: Left;  . CYSTOSCOPY/URETEROSCOPY/HOLMIUM LASER/STENT PLACEMENT Left 01/13/2020   Procedure: CYSTOSCOPY/URETEROSCOPY/HOLMIUM LASER/STENT EXCHANGE;  Surgeon: Billey Co, MD;  Location: [facility] ORS;  Service: Urology;  Laterality: Left;  . ENDOVASCULAR REPAIR/STENT GRAFT N/A 05/18/2019   Procedure: ENDOVASCULAR REPAIR/STENT GRAFT;  Surgeon: Algernon Huxley, MD;  Location: [facility] INVASIVE CV LAB;; EVAR - 23 mm prox, 12 cm distal & x 18 cm length Gore Excluder Endoprosthesis Main body (via RFA distal to lowest Renal A); 49m x 14 cm L Contralateral Limb for L Iliac - extended with 8 mm x 6cm LifeStar stent; no additional R Iliac PTA needed.   . ESOPHAGOGASTRODUODENOSCOPY (EGD) WITH PROPOFOL N/A 09/15/2017    Procedure: ESOPHAGOGASTRODUODENOSCOPY (EGD) WITH PROPOFOL;  Surgeon: WLucilla Lame MD;  [facility];  Service: Endoscopy;  Laterality: N/A;  . FRACTURE SURGERY Right 03/19/2015   wrist  . FRACTURE SURGERY Left   . HIATAL HERNIA REPAIR    . ILIAC ARTERY STENT  12/15/2005   PTA and direct stenting rgt and lft common iliac arteries  . KYPHOPLASTY N/A 04/05/2019   Procedure: KYPHOPLASTY T12, L3, L4 L5;  Surgeon: MHessie Knows MD;  Location: [facility] ORS;  Service: Orthopedics;  Laterality: N/A;  . NM GATED MYOVIEW ([place])  05/'19; 8/'20   a) [facility]: Low Risk - no ischemia or infarct.  EF > 65%. no RWMA;; b) ([facility]: Normal wall motion.  No ischemia or infarction.  . OPEN REDUCTION INTERNAL FIXATION (ORIF) DISTAL RADIAL FRACTURE Left 01/13/2019   Procedure:  OPEN REDUCTION INTERNAL FIXATION (ORIF) DISTAL  RADIAL FRACTURE - LEFT - SLEEP APNEA;  Surgeon: Hessie Knows, MD;  Location: [facility] ORS;  Service: Orthopedics;  Laterality: Left;  . ORIF ELBOW FRACTURE Right 01/01/2017   Procedure: OPEN REDUCTION INTERNAL FIXATION (ORIF) ELBOW/OLECRANON FRACTURE;  Surgeon: Hessie Knows, MD;  Location: [facility] ORS;  Service: Orthopedics;  Laterality: Right;  . ORIF WRIST FRACTURE Right 03/19/2015   Procedure: OPEN REDUCTION INTERNAL FIXATION (ORIF) WRIST FRACTURE;  Surgeon: Hessie Knows, MD;  Location: [facility] ORS;  Service: Orthopedics;  Laterality: Right;  . SHOULDER ARTHROSCOPY Right   . THORACIC AORTA - CAROTID ANGIOGRAM  October 2007   Dr. 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 TRANSTHORACIC ECHOCARDIOGRAM  February 2016   [facility]: Normal LV function. Dilated left atrium.  Domingo Dimes ECHOCARDIOGRAM  04/2019   [facility] April 20, 2019): EF 55-60 %.  Mild LVH.  Relatively normal valves.    There were no vitals filed for this visit.   Subjective Assessment - 05/15/20 0932    Subjective Patient  notes performing exercises since last session. Patient reports 1 fall since last visit but is unable to recall how it occurred. Denies any pain after incident.    Pertinent History Patient is a pleasant 86 year old who was last seen by this PT on 12/22/19; since then has been admitted to the hospital and had other medical complications including: two surgeries for kidney stones, another one for his broken L foot. Was in a boot for 6 weeks (boot came off a week ago), and procedures for his back. Has been having hallucinations during the day and night time with increased rate frequently  Pain in back is associated with weakness, had facet joint injections. He has a history of kyphoplasty to T12, L3, L4, L5 performed in August 2020 by Dr. Rudene Christians. MRI from [facility] dated 03/28/2019 report was reviewed today. Acute compression fractures are noted at T12, L3, and L4. He has facet arthropathy at the lower lumbar levels. At L3-4 he has moderate central stenosis. PMH includes AAA, arthritis, atherosclerotic heart disease (s/p CABG), benign neoplasm of ascending/descending colon, COPD, CAD, dementia, dysrhythmia, falls, GERD, hemorrhoid, HOH, HTN, PAF, prostate cancer, osteoporosis, kidney stones, macular degeneration.    Limitations Walking;Standing;House hold activities;Other (comment);Lifting;Sitting    How long can you sit comfortably? 5 minutes    How long can you stand comfortably? needs a walker now, unsteady once standing    How long can you walk comfortably? uses a walker now; can walk in house.    Diagnostic tests MRI from [facility] dated 03/28/2019 report was reviewed today. Acute compression fractures are noted at T12, L3, and L4. He has facet arthropathy at the lower lumbar levels. At L3-4 he has moderate central stenosis.    Patient Stated Goals decrease pain and improve balance.    Currently in Pain? No/denies                      Vitals: 108/61,  89bpm  Standing with 3# Ankle Weight: BUE support on bar - Hamstring curls 10x each leg - Marches 10x each leg - PF/DF rocking 10x each direction  Standing with RW: No weights:  - STS 8x.  Rollator in front for safety. SPT cues for 1/1 hand placement throughout task for inc safety, as pt would initiate with both hands on rollator. - PT hides  6 cones around gym at various heights to challenge environmental scanning and ambulation in distracting environments. Patient able to find cones in 4 minutes with visual and verbal cueing from PT. Total ambulation distance ~95 feet (1 lap around gym).  In Front of Golden on Airex Pad: - Normal base of support 40 seconds. CGA for safety. - Normal base of support with horizontal head turns 10x, vertical head turns 10x. CGA for safety. As patient brings head up for vertical raise patient experiences posterior LOB.  Seated: - Catching/kicking bolster for timing, coordination of BLEs , and sequencing of movement. 12x - Forward leans for core strength and control to touch SPT hand 10x        -    TA bracing c PB 10x with 5 second hold    Pt educated throughout session about proper posture and technique with exercises. Improved exercise technique, movement at target joints, use of target muscles after min to mod verbal, visual, tactile cues.          PT Education - 05/15/20 0950    Education provided Yes    Education Details body mechanics, exercise technique    Person(s) Educated Patient    Methods Explanation;Demonstration;Tactile cues;Verbal cues    Comprehension Verbalized understanding;Returned demonstration;Verbal cues required;Tactile cues required            PT Short Term Goals - 04/24/20 1304      PT SHORT TERM GOAL #1   Title Patient will be independent in home exercise program to improve strength/mobility for better functional independence with ADLs.    Baseline 6/29: HEP given 8/17: HEP compliant with son 8/24: compliant with son     Time 4    Period Weeks    Status Achieved    Target Date 03/27/20      PT SHORT TERM GOAL #2   Title Patient will perform 5 sit to stand transfers without UE support to increase functional mobility for carryover to community activities.    Baseline 6/29; only able to perform 2 8/17: able to perform 4    Time 4    Period Weeks    Status Partially Met    Target Date 05/22/20             PT Long Term Goals - 04/24/20 1305      PT LONG TERM GOAL #1   Title Patient will increase FOTO score to equal to or greater than 54% to demonstrate statistically significant improvement in mobility and quality of life.    Baseline 6/29: 54% 8/17: 43.6%    Time 8    Period Weeks    Status On-going    Target Date 06/19/20      PT LONG TERM GOAL #2   Title Patient will demonstrate an improved Berg Balance Score of > 36 as to demonstrate improved balance with ADLs such as sitting/standing and transfer balance and reduced fall risk.    Baseline 6/29: 30/56 8/17: 37/56    Time 8    Period Weeks    Status Achieved      PT LONG TERM GOAL #3   Title Patient will increase 10 meter walk test to >1.64m/s as to improve gait speed for better community ambulation and to reduce fall risk.    Baseline 6/29: 0.44m/s with RW 8/17: >1.0 with rollator    Time 8    Period Weeks    Status Achieved      PT LONG TERM GOAL #4  Title Patient will increase BLE gross strength to 4+/5 as to improve functional strength for independent gait, increased standing tolerance and increased ADL ability.    Baseline 6/29: see note 8/17: see note    Time 8    Period Weeks    Status Partially Met    Target Date 06/19/20      PT LONG TERM GOAL #5   Title Patient will be able to perform household work/ chores without increase in symptoms.    Baseline 6/29: unable to perform 8/17: limited by balance and cognition as well as pain    Time 8    Period Weeks    Status Partially Met    Target Date 06/19/20                  Plan - 05/15/20 1119    Clinical Impression Statement Patient demonstrates improved attention to tasks this session, specifically during more mentally stimulating interventions. Patient continues to require visual demonstration and verbal/tactile cues during exercise for correct execution. Patient displays increased activity tolerance as session progresses but does require seated therapeutic rest breaks due to fatigue. Patient notes feeling of accomplishment at end of session. He would continue to benefit from skilled PT to increase strength/stability for safer ADL performance, decreased fall risk, and increased functional ability.    Personal Factors and Comorbidities Age;Comorbidity 3+;Fitness;Past/Current Experience;Sex;Social Background;Time since onset of injury/illness/exacerbation;Transportation    Comorbidities AAA, arthritis, atherosclerotic heart disease, (s/p CABG), benign neoplasm of ascending/descending colon, COPD, CAD, dementia, dysrhythmia, frequent falls, GERD, hemorrhoid, HOH, HTN, PAF, prostate cancer.    Examination-Activity Limitations Bathing;Bend;Carry;Dressing;Continence;Stairs;Squat;Reach Overhead;Locomotion Level;Lift;Stand;Transfers;Toileting;Bed Mobility    Examination-Participation Restrictions Church;Cleaning;Community Activity;Driving;Interpersonal Relationship;Laundry;Volunteer;Shop;Personal Finances;Meal Prep;Yard Work    [employer] Evolving/Moderate complexity    Rehab Potential Fair    PT Frequency 2x / week    PT Duration 8 weeks    PT Treatment/Interventions ADLs/Self Care Home Management;Aquatic Therapy;Cryotherapy;Moist Heat;Traction;Ultrasound;Therapeutic activities;Functional mobility training;Stair training;Gait training;DME Instruction;Therapeutic exercise;Balance training;Neuromuscular re-education;Patient/family education;Manual techniques;Passive range of motion;Energy  conservation;Vestibular;Taping;Biofeedback;Electrical Stimulation;Iontophoresis 4mg /ml Dexamethasone;Dry needling;Visual/perceptual remediation/compensation    PT Next Visit Plan core stability, LE strength, balance    PT Home Exercise Plan see above    Consulted and Agree with Plan of Care Patient           Patient will benefit from skilled therapeutic intervention in order to improve the following deficits and impairments:  Abnormal gait, Cardiopulmonary status limiting activity, Decreased activity tolerance, Decreased balance, Decreased knowledge of precautions, Decreased endurance, Decreased coordination, Decreased cognition, Decreased knowledge of use of DME, Decreased mobility, Difficulty walking, Decreased safety awareness, Decreased strength, Impaired flexibility, Impaired perceived functional ability, Impaired sensation, Postural dysfunction, Improper body mechanics, Pain, Impaired vision/preception, Decreased range of motion  Visit Diagnosis: Unsteadiness on feet  Muscle weakness (generalized)  Other abnormalities of gait and mobility     Problem List Patient Active Problem List   Diagnosis Date Noted  . LBD (Lewy body dementia) ([place]) 05/14/2020  . Hematuria   . Acute kidney injury superimposed on CKD ([place])   . Sepsis due to gram-negative UTI ([place]) 12/26/2019  . Left nephrolithiasis 12/26/2019  . Hydronephrosis, left 12/26/2019  . Status post abdominal aortic aneurysm (AAA) repair 04/15/2019  . Malignant neoplasm of descending colon ([place])   . Do not intubate, cardiopulmonary resuscitation (CPR)-only code status 10/08/2018  . Prostate cancer ([place]) 02/25/2018  . Prostatic cyst 01/15/2018  . 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 ([place]) 09/15/2017  . Blood in stool   . Abnormal feces   .  Stricture and stenosis of esophagus   . Mild cognitive impairment 08/18/2017  . Senile purpura ([place]) 04/29/2017  . Anemia 04/29/2017  . Frequent falls 04/29/2017  . Simple chronic bronchitis  ([place]) 12/17/2016  . Benign localized hyperplasia of prostate with urinary obstruction 07/15/2016  . Elevated PSA 07/15/2016  . Incomplete emptying of bladder 07/15/2016  . Urge incontinence 07/15/2016  . Hypothyroidism 04/25/2016  . Macular degeneration 04/25/2016  . Erectile dysfunction due to arterial insufficiency   . Ischemic heart disease with chronotropic incompetence   . CN (constipation) 02/10/2015  . DD (diverticular disease) 02/10/2015  . Weakness 02/10/2015  . Lichen planus 75/44/9201  . Restless leg 02/10/2015  . 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 B12 deficiency 02/10/2015  . COPD (chronic obstructive pulmonary disease) ([place]) 11/14/2014  . Post Inflammatory Lung Changes 11/14/2014  . Acute delirium 11/08/2014  . PAF (paroxysmal atrial fibrillation) (HCC) CHA2DS2-VASc = 4. AC = Eliquis   . Insomnia 12/09/2013  . OSA on CPAP   . Dyspnea on exertion - -essentially resolved with BB dose reduction & wgt loss 03/29/2013  . Overweight (BMI 25.0-29.9) -- Wgt back up 01/17/2013  . Atherosclerotic heart disease of native coronary artery without angina pectoris - s/p CABG, occluded SVG-D1; patent LIMA-LAD, SVG-OM, SVG- RCA   . Essential hypertension   . Hyperlipidemia with target LDL less than 70   . Aorto-iliac disease ([place])   . BCC (basal cell carcinoma), eyelid 01/03/2013  . Allergic rhinitis 08/17/2009  . Adaptation reaction 05/28/2009  . Acid reflux 05/28/2009  . Arthritis, degenerative 05/28/2009  . 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 Carotid artery occlusion without infarction 06/01/2006  . PAD (peripheral artery disease) - bilateral common iliac stents 12/15/2005  . Atherosclerotic heart disease of artery bypass graft 09/01/1997   Tonny Bollman, SPT  This entire session was performed under direct supervision and direction of a licensed therapist/therapist assistant . I have personally read, edited and approve of the note as written.  Janna Arch,  PT, DPT   05/15/2020, 11:21 AM  [place] MAIN [facility] SERVICES [address] [place], [place], [address] Phone: [phone]   Fax:  [phone]  Name: [name] MRN: [id number] Date of Birth: July 26, 1934

## 2020-05-17 ENCOUNTER — Ambulatory Visit: Payer: Medicare Other

## 2020-05-17 ENCOUNTER — Other Ambulatory Visit: Payer: Self-pay

## 2020-05-17 DIAGNOSIS — R2689 Other abnormalities of gait and mobility: Secondary | ICD-10-CM

## 2020-05-17 DIAGNOSIS — R2681 Unsteadiness on feet: Secondary | ICD-10-CM | POA: Diagnosis not present

## 2020-05-17 DIAGNOSIS — M6281 Muscle weakness (generalized): Secondary | ICD-10-CM

## 2020-05-17 NOTE — Therapy (Signed)
[place] MAIN [facility] SERVICES [address] [place], [place], [address] Phone: [phone]   Fax:  [phone]  Physical Therapy Treatment  Patient Details  Name: [name] MRN: [id number] Date of Birth: 09-24-1933 Referring Provider (PT): Meeler, Sherren Kerns FNP   Encounter Date: 05/17/2020   PT End of Session - 05/17/20 0921    Visit Number 18    Number of Visits 28    Date for PT Re-Evaluation 06/19/20    Authorization Type 6/10 PN 04/17/20    PT Start Time 0930    PT Stop Time 1014    PT Time Calculation (min) 44 min    Equipment Utilized During Treatment Gait belt    Activity Tolerance Patient tolerated treatment well;Patient limited by fatigue    Behavior During Therapy [facility] for tasks assessed/performed           Past Medical History:  Diagnosis Date  . AAA (abdominal aortic aneurysm) ([place]) 05/2019   [place]ativ[place] Rapid progression from June-Aug 2020 (4.6 - 5.4 cm): s/p EVAR (Dr. Lucky Cowboy [facility]): EVAR - 23 mm prox, 12 cm distal & x 18 cm length Gore Excluder Endoprosthesis Main body (via RFA distal to lowest Renal A); 41m x 14 cm L Contralateral Limb for L Iliac - extended with 8 mm x 6cm LifeStar stent (post-dilated with 7 mm DEB).  Additional R Iliac PTA not required.   . Arthritis   . Basal cell carcinoma of eyelid 01/03/2013   2019 - R side of Nose  . Benign neoplasm of ascending colon   . Benign neoplasm of descending colon   . Bilateral iliac artery stenosis ([place] 2007   Bilateral Iliac A stenting; extension of EVAR limbs into both Iliacs -- additional stent placed in L Iliac.  . Cancer ([place]    Skin cancer  . Colon polyps   . Complication of anesthesia    severe confusion agitation requiring hospital admission x 4 days  . Compression fracture of fourth lumbar vertebra ([place] 04/2019   - s/p Kyphoplasty; T12, L3-5 ([facility]  . COPD (chronic obstructive pulmonary disease) (HCC)    Centrilobular Emphysema  . Coronary artery  disease involving native coronary artery without angina pectoris 1998   - s/p CABG, occluded SVG-D1; patent LIMA-LAD, SVG-OM, SVG- RCA  . Dementia ([place]    memory loss - started noting late 2019  . Falls frequently    broke foot  . Fracture 2021   left foot  . GERD (gastroesophageal reflux disease)   . Hemorrhoid 02/10/2015  . History of hiatal hernia   . History of kidney stones   . HOH (hard of hearing)   . Hypertension   . Lewy body dementia ([place] 04/04/2020  . Macular degeneration disease   . Osteoporosis   . PAF (paroxysmal atrial fibrillation) ([place]    on Eliquis for OAC - Rate Control   . Prostate cancer ([place]   . Prostate disorder   . Sciatic pain    Chronic  . Sleep apnea    cpap  . Temporary cerebral vascular dysfunction 02/10/2015   Had negative work-up.  July, 2012.  No medication changes, had forgot them that day, got dehydrated.   . Urinary incontinence     Past Surgical History:  Procedure Laterality Date  . ABDOMINAL AORTIC ENDOVASCULAR STENT GRAFT Bilateral 05/18/2019   Procedure: ABDOMINAL AORTIC ENDOVASCULAR STENT GRAFT;  Surgeon: DAlgernon Huxley MD;  Location: [facility] ORS;  Service: Vascular;  Laterality: Bilateral;  .  ANKLE SURGERY Left    ORIF   . Arch aortogram and carotid aortogram  06/02/2006   Dr Oneida Alar did surgery  . BASAL CELL CARCINOMA EXCISION  04/2016   Dermatology  . CARDIAC CATHETERIZATION  01/30/1998    (Dr. Linard Millers): Native LAD & mRCA CTO. 90% D1. Mod Cx. SVG-D1 CTO. SVG-RCA & SVG-OM along with LIMA-LAD patent;  LV NORMAL Fxn  . CAROTID ENDARTERECTOMY Left Oct. 29, 2007   Dr. Oneida Alar:  . CATARACT EXTRACTION W/PHACO Right 03/05/2017   Procedure: CATARACT EXTRACTION PHACO AND INTRAOCULAR LENS PLACEMENT (IOC);  Surgeon: Leandrew Koyanagi, MD;  Location: [facility] ORS;  Service: Ophthalmology;  Laterality: Right;  [place] 00:58.5 AP% 10.6 CDE 6.20 Fluid Pack lot # [id number]  . CATARACT EXTRACTION W/PHACO Left 04/15/2017   Procedure: CATARACT EXTRACTION  PHACO AND INTRAOCULAR LENS PLACEMENT ([place])  Left;  Surgeon: Leandrew Koyanagi, MD;  Location: [place];  Service: Ophthalmology;  Laterality: Left;  . COLONOSCOPY WITH PROPOFOL N/A 09/15/2017   Procedure: COLONOSCOPY WITH PROPOFOL;  Surgeon: Lucilla Lame, MD;  Location: [facility] ENDOSCOPY;  Service: Endoscopy;  Laterality: N/A;  . COLONOSCOPY WITH PROPOFOL N/A 10/26/2018   Procedure: COLONOSCOPY WITH PROPOFOL;  Surgeon: Lucilla Lame, MD;  Location: [facility] ENDOSCOPY;  Service: Endoscopy;  Laterality: N/A;  . CORONARY ARTERY BYPASS GRAFT  1998   LIMA-LAD, SVG-OM, SVG-RPDA, SVG-DIAG  . CPET / MET  12/2012   Mild chronotropic incompetence - read 82% of predicted; also reduced effort; peak VO2 15.7 / 75% (did not reach Max effort) -- suggested ischemic response in last 1.5 minutes of exercise. Normal pulmonary function on PFTs but poor response to  . CYSTOSCOPY W/ RETROGRADES Left 01/13/2020   Procedure: CYSTOSCOPY WITH RETROGRADE PYELOGRAM;  Surgeon: Billey Co, MD;  Location: [facility] ORS;  Service: Urology;  Laterality: Left;  . CYSTOSCOPY WITH STENT PLACEMENT Left 12/26/2019   Procedure: CYSTOSCOPY WITH STENT PLACEMENT;  Surgeon: Billey Co, MD;  Location: [facility] ORS;  Service: Urology;  Laterality: Left;  . CYSTOSCOPY/URETEROSCOPY/HOLMIUM LASER/STENT PLACEMENT Left 01/13/2020   Procedure: CYSTOSCOPY/URETEROSCOPY/HOLMIUM LASER/STENT EXCHANGE;  Surgeon: Billey Co, MD;  Location: [facility] ORS;  Service: Urology;  Laterality: Left;  . ENDOVASCULAR REPAIR/STENT GRAFT N/A 05/18/2019   Procedure: ENDOVASCULAR REPAIR/STENT GRAFT;  Surgeon: Algernon Huxley, MD;  Location: [facility] INVASIVE CV LAB;; EVAR - 23 mm prox, 12 cm distal & x 18 cm length Gore Excluder Endoprosthesis Main body (via RFA distal to lowest Renal A); 49m x 14 cm L Contralateral Limb for L Iliac - extended with 8 mm x 6cm LifeStar stent; no additional R Iliac PTA needed.   . ESOPHAGOGASTRODUODENOSCOPY (EGD) WITH PROPOFOL N/A 09/15/2017    Procedure: ESOPHAGOGASTRODUODENOSCOPY (EGD) WITH PROPOFOL;  Surgeon: WLucilla Lame MD;  [facility];  Service: Endoscopy;  Laterality: N/A;  . FRACTURE SURGERY Right 03/19/2015   wrist  . FRACTURE SURGERY Left   . HIATAL HERNIA REPAIR    . ILIAC ARTERY STENT  12/15/2005   PTA and direct stenting rgt and lft common iliac arteries  . KYPHOPLASTY N/A 04/05/2019   Procedure: KYPHOPLASTY T12, L3, L4 L5;  Surgeon: MHessie Knows MD;  Location: [facility] ORS;  Service: Orthopedics;  Laterality: N/A;  . NM GATED MYOVIEW ([place])  05/'19; 8/'20   a) [facility]: Low Risk - no ischemia or infarct.  EF > 65%. no RWMA;; b) ([facility]: Normal wall motion.  No ischemia or infarction.  . OPEN REDUCTION INTERNAL FIXATION (ORIF) DISTAL RADIAL FRACTURE Left 01/13/2019   Procedure:  OPEN REDUCTION INTERNAL FIXATION (ORIF) DISTAL  RADIAL FRACTURE - LEFT - SLEEP APNEA;  Surgeon: Hessie Knows, MD;  Location: [facility] ORS;  Service: Orthopedics;  Laterality: Left;  . ORIF ELBOW FRACTURE Right 01/01/2017   Procedure: OPEN REDUCTION INTERNAL FIXATION (ORIF) ELBOW/OLECRANON FRACTURE;  Surgeon: Hessie Knows, MD;  Location: [facility] ORS;  Service: Orthopedics;  Laterality: Right;  . ORIF WRIST FRACTURE Right 03/19/2015   Procedure: OPEN REDUCTION INTERNAL FIXATION (ORIF) WRIST FRACTURE;  Surgeon: Hessie Knows, MD;  Location: [facility] ORS;  Service: Orthopedics;  Laterality: Right;  . SHOULDER ARTHROSCOPY Right   . THORACIC AORTA - CAROTID ANGIOGRAM  October 2007   Dr. 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 TRANSTHORACIC ECHOCARDIOGRAM  February 2016   [facility]: Normal LV function. Dilated left atrium.  Domingo Dimes ECHOCARDIOGRAM  04/2019   [facility] April 20, 2019): EF 55-60 %.  Mild LVH.  Relatively normal valves.    There were no vitals filed for this visit.   Subjective Assessment - 05/17/20 0934    Subjective Patient  denies falls or LOB since last session. Notes performing exercises and is feeling "good"    Pertinent History Patient is a pleasant 86 year old who was last seen by this PT on 12/22/19; since then has been admitted to the hospital and had other medical complications including: two surgeries for kidney stones, another one for his broken L foot. Was in a boot for 6 weeks (boot came off a week ago), and procedures for his back. Has been having hallucinations during the day and night time with increased rate frequently  Pain in back is associated with weakness, had facet joint injections. He has a history of kyphoplasty to T12, L3, L4, L5 performed in August 2020 by Dr. Rudene Christians. MRI from [facility] dated 03/28/2019 report was reviewed today. Acute compression fractures are noted at T12, L3, and L4. He has facet arthropathy at the lower lumbar levels. At L3-4 he has moderate central stenosis. PMH includes AAA, arthritis, atherosclerotic heart disease (s/p CABG), benign neoplasm of ascending/descending colon, COPD, CAD, dementia, dysrhythmia, falls, GERD, hemorrhoid, HOH, HTN, PAF, prostate cancer, osteoporosis, kidney stones, macular degeneration.    Limitations Walking;Standing;House hold activities;Other (comment);Lifting;Sitting    How long can you sit comfortably? 5 minutes    How long can you stand comfortably? needs a walker now, unsteady once standing    How long can you walk comfortably? uses a walker now; can walk in house.    Diagnostic tests MRI from [facility] dated 03/28/2019 report was reviewed today. Acute compression fractures are noted at T12, L3, and L4. He has facet arthropathy at the lower lumbar levels. At L3-4 he has moderate central stenosis.    Patient Stated Goals decrease pain and improve balance.    Currently in Pain? No/denies                Vitals: 119/57, 77bpm   Standing with 4# Ankle Weight: BUE support on bar  Hamstring curls 10x each  leg  Step taps on 4" step. 10x each leg. Patient demos circumduction on L for some repetitions.  Hip extension 10x each leg  Hip Abduction 10x each leg   Standing with RW: No weights:  STS 10x.  Rollator in front for safety. SPT cues for 1/1 hand placement throughout task for inc safety, as pt would initiate with both hands on rollator.  Balloon taps for balance, coordination, and sequencing of movement. 20x   In [employer] on Airex Pad: Narrow base of support 40 seconds. CGA for safety.  Staggered stance 30 seconds each leg.   Seated: Ball kicks for timing, coordination of BLEs , and sequencing of movement. 20x  LAQ with 4# AW. SPT provides cues to hold end position to maximize contraction. 10x  Bolster kicks for timing, coordination of BLEs and movement sequencing. Patient displays asymmetrical movement, placing LLE on bolster before RLE. SPT cues for BLE movement simultaneously. 15x  Heat donned for R neck pain during session x 10 minutes. SPT performs upper trap stretch 30 sec x 1 for R upper trap. Neck observed after heat application for any adverse reactions. No adverse reactions noted.   Pt educated throughout session about proper posture and technique with exercises. Improved exercise technique, movement at target joints, use of target muscles after min to mod verbal, visual, tactile cues.    SPT monitored vitals throughout session to ensure safe therapeutic ranges.                  PT Education - 05/17/20 [phone]    Education provided Yes    Education Details exercise technique, body mechanics    Person(s) Educated Patient    Methods Explanation;Demonstration;Tactile cues;Verbal cues    Comprehension Verbalized understanding;Returned demonstration;Verbal cues required;Tactile cues required            PT Short Term Goals - 04/24/20 1304      PT SHORT TERM GOAL #1   Title Patient will be independent in home exercise program to improve  strength/mobility for better functional independence with ADLs.    Baseline 6/29: HEP given 8/17: HEP compliant with son 8/24: compliant with son    Time 4    Period Weeks    Status Achieved    Target Date 03/27/20      PT SHORT TERM GOAL #2   Title Patient will perform 5 sit to stand transfers without UE support to increase functional mobility for carryover to community activities.    Baseline 6/29; only able to perform 2 8/17: able to perform 4    Time 4    Period Weeks    Status Partially Met    Target Date 05/22/20             PT Long Term Goals - 04/24/20 1305      PT LONG TERM GOAL #1   Title Patient will increase FOTO score to equal to or greater than 54% to demonstrate statistically significant improvement in mobility and quality of life.    Baseline 6/29: 54% 8/17: 43.6%    Time 8    Period Weeks    Status On-going    Target Date 06/19/20      PT LONG TERM GOAL #2   Title Patient will demonstrate an improved Berg Balance Score of > 36 as to demonstrate improved balance with ADLs such as sitting/standing and transfer balance and reduced fall risk.    Baseline 6/29: 30/56 8/17: 37/56    Time 8    Period Weeks    Status Achieved      PT LONG TERM GOAL #3   Title Patient will increase 10 meter walk test to >1.33ms as to improve gait speed for better community ambulation and to reduce fall risk.    Baseline 6/29: 0.553m with RW 8/17: >1.0 with rollator    Time 8    Period  Weeks    Status Achieved      PT LONG TERM GOAL #4   Title Patient will increase BLE gross strength to 4+/5 as to improve functional strength for independent gait, increased standing tolerance and increased ADL ability.    Baseline 6/29: see note 8/17: see note    Time 8    Period Weeks    Status Partially Met    Target Date 06/19/20      PT LONG TERM GOAL #5   Title Patient will be able to perform household work/ chores without increase in symptoms.    Baseline 6/29: unable to perform  8/17: limited by balance and cognition as well as pain    Time 8    Period Weeks    Status Partially Met    Target Date 06/19/20                 Plan - 05/17/20 1103    Clinical Impression Statement Patient continues to demonstrate improved attention to task and motivation this session. Patient notes soreness in R upper trapezius part way through the session, most likely due to sleeping position. Patient requires verbal and visual cues for exercise technique. He would continue to benefit from skilled PT to increase strength/stability for safer ADL performance, decreased fall risk, and increased functional ability.    Personal Factors and Comorbidities Age;Comorbidity 3+;Fitness;Past/Current Experience;Sex;Social Background;Time since onset of injury/illness/exacerbation;Transportation    Comorbidities AAA, arthritis, atherosclerotic heart disease, (s/p CABG), benign neoplasm of ascending/descending colon, COPD, CAD, dementia, dysrhythmia, frequent falls, GERD, hemorrhoid, HOH, HTN, PAF, prostate cancer.    Examination-Activity Limitations Bathing;Bend;Carry;Dressing;Continence;Stairs;Squat;Reach Overhead;Locomotion Level;Lift;Stand;Transfers;Toileting;Bed Mobility    Examination-Participation Restrictions Church;Cleaning;Community Activity;Driving;Interpersonal Relationship;Laundry;Volunteer;Shop;Personal Finances;Meal Prep;Yard Work    [employer] Evolving/Moderate complexity    Rehab Potential Fair    PT Frequency 2x / week    PT Duration 8 weeks    PT Treatment/Interventions ADLs/Self Care Home Management;Aquatic Therapy;Cryotherapy;Moist Heat;Traction;Ultrasound;Therapeutic activities;Functional mobility training;Stair training;Gait training;DME Instruction;Therapeutic exercise;Balance training;Neuromuscular re-education;Patient/family education;Manual techniques;Passive range of motion;Energy conservation;Vestibular;Taping;Biofeedback;Electrical  Stimulation;Iontophoresis 79m/ml Dexamethasone;Dry needling;Visual/perceptual remediation/compensation    PT Next Visit Plan core stability, LE strength, balance    PT Home Exercise Plan see above    Consulted and Agree with Plan of Care Patient           Patient will benefit from skilled therapeutic intervention in order to improve the following deficits and impairments:  Abnormal gait, Cardiopulmonary status limiting activity, Decreased activity tolerance, Decreased balance, Decreased knowledge of precautions, Decreased endurance, Decreased coordination, Decreased cognition, Decreased knowledge of use of DME, Decreased mobility, Difficulty walking, Decreased safety awareness, Decreased strength, Impaired flexibility, Impaired perceived functional ability, Impaired sensation, Postural dysfunction, Improper body mechanics, Pain, Impaired vision/preception, Decreased range of motion  Visit Diagnosis: Unsteadiness on feet  Muscle weakness (generalized)  Other abnormalities of gait and mobility     Problem List Patient Active Problem List   Diagnosis Date Noted  . LBD (Lewy body dementia) ([place] 05/14/2020  . Hematuria   . Acute kidney injury superimposed on CKD ([place]   . Sepsis due to gram-negative UTI (HL[place] 12/26/2019  . Left nephrolithiasis 12/26/2019  . Hydronephrosis, left 12/26/2019  . Status post abdominal aortic aneurysm (AAA) repair 04/15/2019  . Malignant neoplasm of descending colon ([place]   . Do not intubate, cardiopulmonary resuscitation (CPR)-only code status 10/08/2018  . Prostate cancer ([place] 02/25/2018  . Prostatic cyst 01/15/2018  . 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 ([place] 09/15/2017  . Blood in stool   . Abnormal feces   .  Stricture and stenosis of esophagus   . Mild cognitive impairment 08/18/2017  . Senile purpura ([place]) 04/29/2017  . Anemia 04/29/2017  . Frequent falls 04/29/2017  . Simple chronic bronchitis ([place]) 12/17/2016  . Benign localized hyperplasia of  prostate with urinary obstruction 07/15/2016  . Elevated PSA 07/15/2016  . Incomplete emptying of bladder 07/15/2016  . Urge incontinence 07/15/2016  . Hypothyroidism 04/25/2016  . Macular degeneration 04/25/2016  . Erectile dysfunction due to arterial insufficiency   . Ischemic heart disease with chronotropic incompetence   . CN (constipation) 02/10/2015  . DD (diverticular disease) 02/10/2015  . Weakness 02/10/2015  . Lichen planus 37/99/0940  . Restless leg 02/10/2015  . 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 B12 deficiency 02/10/2015  . COPD (chronic obstructive pulmonary disease) ([place]) 11/14/2014  . Post Inflammatory Lung Changes 11/14/2014  . Acute delirium 11/08/2014  . PAF (paroxysmal atrial fibrillation) (HCC) CHA2DS2-VASc = 4. AC = Eliquis   . Insomnia 12/09/2013  . OSA on CPAP   . Dyspnea on exertion - -essentially resolved with BB dose reduction & wgt loss 03/29/2013  . Overweight (BMI 25.0-29.9) -- Wgt back up 01/17/2013  . Atherosclerotic heart disease of native coronary artery without angina pectoris - s/p CABG, occluded SVG-D1; patent LIMA-LAD, SVG-OM, SVG- RCA   . Essential hypertension   . Hyperlipidemia with target LDL less than 70   . Aorto-iliac disease ([place])   . BCC (basal cell carcinoma), eyelid 01/03/2013  . Allergic rhinitis 08/17/2009  . Adaptation reaction 05/28/2009  . Acid reflux 05/28/2009  . Arthritis, degenerative 05/28/2009  . 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 Carotid artery occlusion without infarction 06/01/2006  . PAD (peripheral artery disease) - bilateral common iliac stents 12/15/2005  . Atherosclerotic heart disease of artery bypass graft 09/01/1997   Tonny Bollman, SPT  This entire session was performed under direct supervision and direction of a licensed therapist/therapist assistant . I have personally read, edited and approve of the note as written.  Janna Arch, PT, DPT   05/17/2020, 11:06 AM  [place] MAIN [facility] SERVICES [address] [place], [place], [address] Phone: [phone]   Fax:  [phone]  Name: [name] MRN: [id number] Date of Birth: Mar 13, 1934

## 2020-05-22 ENCOUNTER — Ambulatory Visit: Payer: TRICARE For Life (TFL) | Admitting: Physician Assistant

## 2020-05-22 ENCOUNTER — Other Ambulatory Visit: Payer: Self-pay

## 2020-05-22 ENCOUNTER — Ambulatory Visit: Payer: Medicare Other

## 2020-05-22 DIAGNOSIS — R2689 Other abnormalities of gait and mobility: Secondary | ICD-10-CM

## 2020-05-22 DIAGNOSIS — R2681 Unsteadiness on feet: Secondary | ICD-10-CM

## 2020-05-22 DIAGNOSIS — M6281 Muscle weakness (generalized): Secondary | ICD-10-CM

## 2020-05-22 NOTE — Therapy (Signed)
[place] MAIN [facility] SERVICES [address] [place], [place], [address] Phone: [phone]   Fax:  [phone]  Physical Therapy Treatment  Patient Details  Name: [name] MRN: [id number] Date of Birth: 1933-11-09 Referring Provider (PT): Meeler, Sherren Kerns FNP   Encounter Date: 05/22/2020   PT End of Session - 05/22/20 0926    Visit Number 19    Number of Visits 28    Date for PT Re-Evaluation 06/19/20    Authorization Type 9/10 PN 04/17/20    PT Start Time 0930    PT Stop Time 1014    PT Time Calculation (min) 44 min    Equipment Utilized During Treatment Gait belt    Activity Tolerance Patient tolerated treatment well;Patient limited by fatigue    Behavior During Therapy [facility] for tasks assessed/performed           Past Medical History:  Diagnosis Date  . AAA (abdominal aortic aneurysm) ([place]) 05/2019   [place] Rapid progression from June-Aug 2020 (4.6 - 5.4 cm): s/p EVAR (Dr. Lucky Cowboy [facility]): EVAR - 23 mm prox, 12 cm distal & x 18 cm length Gore Excluder Endoprosthesis Main body (via RFA distal to lowest Renal A); 23m x 14 cm L Contralateral Limb for L Iliac - extended with 8 mm x 6cm LifeStar stent (post-dilated with 7 mm DEB).  Additional R Iliac PTA not required.   . Arthritis   . Basal cell carcinoma of eyelid 01/03/2013   2019 - R side of Nose  . Benign neoplasm of ascending colon   . Benign neoplasm of descending colon   . Bilateral [place]ac artery stenosis ([place] 2007   Bilateral Iliac A stenting; extension of EVAR limbs into both Iliacs -- additional stent placed in L Iliac.  . Cancer ([place]    Skin cancer  . Colon polyps   . Complication of anesthesia    severe confusion agitation requiring hospital admission x 4 days  . Compression fracture of fourth lumbar vertebra ([place] 04/2019   - s/p Kyphoplasty; T12, L3-5 ([facility]  . COPD (chronic obstructive pulmonary disease) (HCC)    Centrilobular Emphysema  . Coronary artery  disease involving native coronary artery without angina pectoris 1998   - s/p CABG, occluded SVG-D1; patent LIMA-LAD, SVG-OM, SVG- RCA  . Dementia ([place]    memory loss - started noting late 2019  . Falls frequently    broke foot  . Fracture 2021   left foot  . GERD (gastroesophageal reflux disease)   . Hemorrhoid 02/10/2015  . History of hiatal hernia   . History of kidney stones   . HOH (hard of hearing)   . Hypertension   . Lewy body dementia ([place] 04/04/2020  . Macular degeneration disease   . Osteoporosis   . PAF (paroxysmal atrial fibrillation) ([place]    on Eliquis for OAC - Rate Control   . Prostate cancer ([place]   . Prostate disorder   . Sciatic pain    Chronic  . Sleep apnea    cpap  . Temporary cerebral vascular dysfunction 02/10/2015   Had negative work-up.  July, 2012.  No medication changes, had forgot them that day, got dehydrated.   . Urinary incontinence     Past Surgical History:  Procedure Laterality Date  . ABDOMINAL AORTIC ENDOVASCULAR STENT GRAFT Bilateral 05/18/2019   Procedure: ABDOMINAL AORTIC ENDOVASCULAR STENT GRAFT;  Surgeon: DAlgernon Huxley MD;  Location: [facility] ORS;  Service: Vascular;  Laterality: Bilateral;  .  ANKLE SURGERY Left    ORIF   . Arch aortogram and carotid aortogram  06/02/2006   Dr Oneida Alar did surgery  . BASAL CELL CARCINOMA EXCISION  04/2016   Dermatology  . CARDIAC CATHETERIZATION  01/30/1998    (Dr. Linard Millers): Native LAD & mRCA CTO. 90% D1. Mod Cx. SVG-D1 CTO. SVG-RCA & SVG-OM along with LIMA-LAD patent;  LV NORMAL Fxn  . CAROTID ENDARTERECTOMY Left Oct. 29, 2007   Dr. Oneida Alar:  . CATARACT EXTRACTION W/PHACO Right 03/05/2017   Procedure: CATARACT EXTRACTION PHACO AND INTRAOCULAR LENS PLACEMENT (IOC);  Surgeon: Leandrew Koyanagi, MD;  Location: [facility] ORS;  Service: Ophthalmology;  Laterality: Right;  [place] 00:58.5 AP% 10.6 CDE 6.20 Fluid Pack lot # [id number]  . CATARACT EXTRACTION W/PHACO Left 04/15/2017   Procedure: CATARACT EXTRACTION  PHACO AND INTRAOCULAR LENS PLACEMENT ([place])  Left;  Surgeon: Leandrew Koyanagi, MD;  Location: [place];  Service: Ophthalmology;  Laterality: Left;  . COLONOSCOPY WITH PROPOFOL N/A 09/15/2017   Procedure: COLONOSCOPY WITH PROPOFOL;  Surgeon: Lucilla Lame, MD;  Location: [facility] ENDOSCOPY;  Service: Endoscopy;  Laterality: N/A;  . COLONOSCOPY WITH PROPOFOL N/A 10/26/2018   Procedure: COLONOSCOPY WITH PROPOFOL;  Surgeon: Lucilla Lame, MD;  Location: [facility] ENDOSCOPY;  Service: Endoscopy;  Laterality: N/A;  . CORONARY ARTERY BYPASS GRAFT  1998   LIMA-LAD, SVG-OM, SVG-RPDA, SVG-DIAG  . CPET / MET  12/2012   Mild chronotropic incompetence - read 82% of predicted; also reduced effort; peak VO2 15.7 / 75% (did not reach Max effort) -- suggested ischemic response in last 1.5 minutes of exercise. Normal pulmonary function on PFTs but poor response to  . CYSTOSCOPY W/ RETROGRADES Left 01/13/2020   Procedure: CYSTOSCOPY WITH RETROGRADE PYELOGRAM;  Surgeon: Billey Co, MD;  Location: [facility] ORS;  Service: Urology;  Laterality: Left;  . CYSTOSCOPY WITH STENT PLACEMENT Left 12/26/2019   Procedure: CYSTOSCOPY WITH STENT PLACEMENT;  Surgeon: Billey Co, MD;  Location: [facility] ORS;  Service: Urology;  Laterality: Left;  . CYSTOSCOPY/URETEROSCOPY/HOLMIUM LASER/STENT PLACEMENT Left 01/13/2020   Procedure: CYSTOSCOPY/URETEROSCOPY/HOLMIUM LASER/STENT EXCHANGE;  Surgeon: Billey Co, MD;  Location: [facility] ORS;  Service: Urology;  Laterality: Left;  . ENDOVASCULAR REPAIR/STENT GRAFT N/A 05/18/2019   Procedure: ENDOVASCULAR REPAIR/STENT GRAFT;  Surgeon: Algernon Huxley, MD;  Location: [facility] INVASIVE CV LAB;; EVAR - 23 mm prox, 12 cm distal & x 18 cm length Gore Excluder Endoprosthesis Main body (via RFA distal to lowest Renal A); 49m x 14 cm L Contralateral Limb for L Iliac - extended with 8 mm x 6cm LifeStar stent; no additional R Iliac PTA needed.   . ESOPHAGOGASTRODUODENOSCOPY (EGD) WITH PROPOFOL N/A 09/15/2017    Procedure: ESOPHAGOGASTRODUODENOSCOPY (EGD) WITH PROPOFOL;  Surgeon: WLucilla Lame MD;  [facility];  Service: Endoscopy;  Laterality: N/A;  . FRACTURE SURGERY Right 03/19/2015   wrist  . FRACTURE SURGERY Left   . HIATAL HERNIA REPAIR    . ILIAC ARTERY STENT  12/15/2005   PTA and direct stenting rgt and lft common iliac arteries  . KYPHOPLASTY N/A 04/05/2019   Procedure: KYPHOPLASTY T12, L3, L4 L5;  Surgeon: MHessie Knows MD;  Location: [facility] ORS;  Service: Orthopedics;  Laterality: N/A;  . NM GATED MYOVIEW ([place])  05/'19; 8/'20   a) [facility]: Low Risk - no ischemia or infarct.  EF > 65%. no RWMA;; b) ([facility]: Normal wall motion.  No ischemia or infarction.  . OPEN REDUCTION INTERNAL FIXATION (ORIF) DISTAL RADIAL FRACTURE Left 01/13/2019   Procedure:  OPEN REDUCTION INTERNAL FIXATION (ORIF) DISTAL  RADIAL FRACTURE - LEFT - SLEEP APNEA;  Surgeon: Hessie Knows, MD;  Location: [facility] ORS;  Service: Orthopedics;  Laterality: Left;  . ORIF ELBOW FRACTURE Right 01/01/2017   Procedure: OPEN REDUCTION INTERNAL FIXATION (ORIF) ELBOW/OLECRANON FRACTURE;  Surgeon: Hessie Knows, MD;  Location: [facility] ORS;  Service: Orthopedics;  Laterality: Right;  . ORIF WRIST FRACTURE Right 03/19/2015   Procedure: OPEN REDUCTION INTERNAL FIXATION (ORIF) WRIST FRACTURE;  Surgeon: Hessie Knows, MD;  Location: [facility] ORS;  Service: Orthopedics;  Laterality: Right;  . SHOULDER ARTHROSCOPY Right   . THORACIC AORTA - CAROTID ANGIOGRAM  October 2007   Dr. 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 TRANSTHORACIC ECHOCARDIOGRAM  February 2016   [facility]: Normal LV function. Dilated left atrium.  Domingo Dimes ECHOCARDIOGRAM  04/2019   [facility] April 20, 2019): EF 55-60 %.  Mild LVH.  Relatively normal valves.    There were no vitals filed for this visit.   Subjective Assessment - 05/22/20 0933    Subjective Patient  appears dejected at the start of the session, saying "a lot has been going on I can't control". Patient denies LOB or falls since last visit.    Pertinent History Patient is a pleasant 86 year old who was last seen by this PT on 12/22/19; since then has been admitted to the hospital and had other medical complications including: two surgeries for kidney stones, another one for his broken L foot. Was in a boot for 6 weeks (boot came off a week ago), and procedures for his back. Has been having hallucinations during the day and night time with increased rate frequently  Pain in back is associated with weakness, had facet joint injections. He has a history of kyphoplasty to T12, L3, L4, L5 performed in August 2020 by Dr. Rudene Christians. MRI from [facility] dated 03/28/2019 report was reviewed today. Acute compression fractures are noted at T12, L3, and L4. He has facet arthropathy at the lower lumbar levels. At L3-4 he has moderate central stenosis. PMH includes AAA, arthritis, atherosclerotic heart disease (s/p CABG), benign neoplasm of ascending/descending colon, COPD, CAD, dementia, dysrhythmia, falls, GERD, hemorrhoid, HOH, HTN, PAF, prostate cancer, osteoporosis, kidney stones, macular degeneration.    Limitations Walking;Standing;House hold activities;Other (comment);Lifting;Sitting    How long can you sit comfortably? 5 minutes    How long can you stand comfortably? needs a walker now, unsteady once standing    How long can you walk comfortably? uses a walker now; can walk in house.    Diagnostic tests MRI from [facility] dated 03/28/2019 report was reviewed today. Acute compression fractures are noted at T12, L3, and L4. He has facet arthropathy at the lower lumbar levels. At L3-4 he has moderate central stenosis.    Patient Stated Goals decrease pain and improve balance.    Currently in Pain? No/denies             Vitals: 147/70, 75bpm  Treatment:  Standing at  Table: PT sets up Perfection board on one table with pieces on mat table behind patient. Patient instructed to walk to mat table to grab piece, then walk back to table to place piece correctly. Patient stands for 12 minutes while placing 6 pieces. CGA for safety. Patient demonstrates safe turns throughout task with 0 instances of LOB.  Standing in [employer] Standing with 4# ankle weight: CGA  for stability  -Hip extension with unilateral upper extremity support, cueing for neutral hip alignment, upright posture for optimal muscle recruitment, and sequencing, 10x each LE,  -Hip abduction with unilateral upper extremity support, cueing for neutral foot alignment for correct muscle activation, 10x each LE -Hamstring curl with unilateral upper extremity support, cueing for knee alignment for recruitment of hamstring musculature, 10x each LE    Seated with 4# ankle weights  -Seated marches with upright posture, back away from back of chair for abdominal/trunk activation/stabilization, 10x each LE   Seated without weights: - Ball kicks for timing, coordination of BLEs , and sequencing of movement. 12x Hip IR with red resistance band and ball squeeze. 10x each leg. Resisted hip abduction with red resistance band. PT cues for 3 second hold. 10x  Standing with RW: - Balloon taps for balance, coordination, and sequencing of movement. 20x   Patient's session limited by patient needing to use restroom mid-session.   Pt educated throughout session about proper posture and technique with exercises. Improved exercise technique, movement at target joints, use of target muscles after min to mod verbal, visual, tactile cues.                 PT Education - 05/22/20 0957    Education provided Yes    Education Details exercise technique, body mechanics    Person(s) Educated Patient    Methods Explanation;Demonstration;Tactile cues;Verbal cues    Comprehension Verbalized  understanding;Returned demonstration;Verbal cues required;Tactile cues required            PT Short Term Goals - 04/24/20 1304      PT SHORT TERM GOAL #1   Title Patient will be independent in home exercise program to improve strength/mobility for better functional independence with ADLs.    Baseline 6/29: HEP given 8/17: HEP compliant with son 8/24: compliant with son    Time 4    Period Weeks    Status Achieved    Target Date 03/27/20      PT SHORT TERM GOAL #2   Title Patient will perform 5 sit to stand transfers without UE support to increase functional mobility for carryover to community activities.    Baseline 6/29; only able to perform 2 8/17: able to perform 4    Time 4    Period Weeks    Status Partially Met    Target Date 05/22/20             PT Long Term Goals - 04/24/20 1305      PT LONG TERM GOAL #1   Title Patient will increase FOTO score to equal to or greater than 54% to demonstrate statistically significant improvement in mobility and quality of life.    Baseline 6/29: 54% 8/17: 43.6%    Time 8    Period Weeks    Status On-going    Target Date 06/19/20      PT LONG TERM GOAL #2   Title Patient will demonstrate an improved Berg Balance Score of > 36 as to demonstrate improved balance with ADLs such as sitting/standing and transfer balance and reduced fall risk.    Baseline 6/29: 30/56 8/17: 37/56    Time 8    Period Weeks    Status Achieved      PT LONG TERM GOAL #3   Title Patient will increase 10 meter walk test to >1.20ms as to improve gait speed for better community ambulation and to reduce fall risk.    Baseline 6/29:  0.28ms with RW 8/17: >1.0 with rollator    Time 8    Period Weeks    Status Achieved      PT LONG TERM GOAL #4   Title Patient will increase BLE gross strength to 4+/5 as to improve functional strength for independent gait, increased standing tolerance and increased ADL ability.    Baseline 6/29: see note 8/17: see note     Time 8    Period Weeks    Status Partially Met    Target Date 06/19/20      PT LONG TERM GOAL #5   Title Patient will be able to perform household work/ chores without increase in symptoms.    Baseline 6/29: unable to perform 8/17: limited by balance and cognition as well as pain    Time 8    Period Weeks    Status Partially Met    Target Date 06/19/20                 Plan - 05/22/20 1021    Clinical Impression Statement Patient continues to demonstrate increased difficulty with cognitive dual tasks, requiring mod verbal cues to bring attention to task. Patient demonstrates improved balance, indicated by 0 near LOB throughout session. Patient requires reminders for hand placement when performing sit > stand for inc safety. He would continue to benefit from skilled PT to increase strength/stability for safer ADL performance, decreased fall risk, and increased functional ability.    Personal Factors and Comorbidities Age;Comorbidity 3+;Fitness;Past/Current Experience;Sex;Social Background;Time since onset of injury/illness/exacerbation;Transportation    Comorbidities AAA, arthritis, atherosclerotic heart disease, (s/p CABG), benign neoplasm of ascending/descending colon, COPD, CAD, dementia, dysrhythmia, frequent falls, GERD, hemorrhoid, HOH, HTN, PAF, prostate cancer.    Examination-Activity Limitations Bathing;Bend;Carry;Dressing;Continence;Stairs;Squat;Reach Overhead;Locomotion Level;Lift;Stand;Transfers;Toileting;Bed Mobility    Examination-Participation Restrictions Church;Cleaning;Community Activity;Driving;Interpersonal Relationship;Laundry;Volunteer;Shop;Personal Finances;Meal Prep;Yard Work    SMerchant navy officerEvolving/Moderate complexity    Rehab Potential Fair    PT Frequency 2x / week    PT Duration 8 weeks    PT Treatment/Interventions ADLs/Self Care Home Management;Aquatic Therapy;Cryotherapy;Moist Heat;Traction;Ultrasound;Therapeutic  activities;Functional mobility training;Stair training;Gait training;DME Instruction;Therapeutic exercise;Balance training;Neuromuscular re-education;Patient/family education;Manual techniques;Passive range of motion;Energy conservation;Vestibular;Taping;Biofeedback;Electrical Stimulation;Iontophoresis 458mml Dexamethasone;Dry needling;Visual/perceptual remediation/compensation    PT Next Visit Plan core stability, LE strength, balance    PT Home Exercise Plan see above    Consulted and Agree with Plan of Care Patient           Patient will benefit from skilled therapeutic intervention in order to improve the following deficits and impairments:  Abnormal gait, Cardiopulmonary status limiting activity, Decreased activity tolerance, Decreased balance, Decreased knowledge of precautions, Decreased endurance, Decreased coordination, Decreased cognition, Decreased knowledge of use of DME, Decreased mobility, Difficulty walking, Decreased safety awareness, Decreased strength, Impaired flexibility, Impaired perceived functional ability, Impaired sensation, Postural dysfunction, Improper body mechanics, Pain, Impaired vision/preception, Decreased range of motion  Visit Diagnosis: Unsteadiness on feet  Muscle weakness (generalized)  Other abnormalities of gait and mobility     Problem List Patient Active Problem List   Diagnosis Date Noted  . LBD (Lewy body dementia) (HCGl[place]09/13/2021  . Hematuria   . Acute kidney injury superimposed on CKD (HCGlou[place]  . Sepsis due to gram-negative UTI (HCElk [place]04/26/2021  . Left nephrolithiasis 12/26/2019  . Hydronephrosis, left 12/26/2019  . Status post abdominal aortic aneurysm (AAA) repair 04/15/2019  . Malignant neoplasm of descending colon ([place]  . Do not intubate, cardiopulmonary resuscitation (CPR)-only code status 10/08/2018  . Prostate cancer (HCValle Crucis06/27/2019  .  Prostatic cyst 01/15/2018  . 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 ([place]) 09/15/2017  . Blood in  stool   . Abnormal feces   . Stricture and stenosis of esophagus   . Mild cognitive impairment 08/18/2017  . Senile purpura ([place]) 04/29/2017  . Anemia 04/29/2017  . Frequent falls 04/29/2017  . Simple chronic bronchitis ([place]) 12/17/2016  . Benign localized hyperplasia of prostate with urinary obstruction 07/15/2016  . Elevated PSA 07/15/2016  . Incomplete emptying of bladder 07/15/2016  . Urge incontinence 07/15/2016  . Hypothyroidism 04/25/2016  . Macular degeneration 04/25/2016  . Erectile dysfunction due to arterial insufficiency   . Ischemic heart disease with chronotropic incompetence   . CN (constipation) 02/10/2015  . DD (diverticular disease) 02/10/2015  . Weakness 02/10/2015  . Lichen planus 44/96/7591  . Restless leg 02/10/2015  . 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 B12 deficiency 02/10/2015  . COPD (chronic obstructive pulmonary disease) ([place]) 11/14/2014  . Post Inflammatory Lung Changes 11/14/2014  . Acute delirium 11/08/2014  . PAF (paroxysmal atrial fibrillation) (HCC) CHA2DS2-VASc = 4. AC = Eliquis   . Insomnia 12/09/2013  . OSA on CPAP   . Dyspnea on exertion - -essentially resolved with BB dose reduction & wgt loss 03/29/2013  . Overweight (BMI 25.0-29.9) -- Wgt back up 01/17/2013  . Atherosclerotic heart disease of native coronary artery without angina pectoris - s/p CABG, occluded SVG-D1; patent LIMA-LAD, SVG-OM, SVG- RCA   . Essential hypertension   . Hyperlipidemia with target LDL less than 70   . Aorto-iliac disease ([place])   . BCC (basal cell carcinoma), eyelid 01/03/2013  . Allergic rhinitis 08/17/2009  . Adaptation reaction 05/28/2009  . Acid reflux 05/28/2009  . Arthritis, degenerative 05/28/2009  . 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 Carotid artery occlusion without infarction 06/01/2006  . PAD (peripheral artery disease) - bilateral common iliac stents 12/15/2005  . Atherosclerotic heart disease of artery bypass graft 09/01/1997    Tonny Bollman, SPT  This entire session was performed under direct supervision and direction of a licensed therapist/therapist assistant . I have personally read, edited and approve of the note as written.  Janna Arch, PT, DPT   05/22/2020, 2:15 PM  [place] MAIN [facility] SERVICES [address] [place], [place], [address] Phone: [phone]   Fax:  [phone]  Name: BRECKYN TICAS MRN: [id number] Date of Birth: 1934/05/17

## 2020-05-23 ENCOUNTER — Telehealth: Payer: Self-pay | Admitting: Adult Health Nurse Practitioner

## 2020-05-23 NOTE — Telephone Encounter (Signed)
Called listed home number for patient to schedule the Palliative Consult, no answer - left message with reason for call along with my name and contact number.  I also called patient's significant other Willette Brace, home number - no answer and unable to leave a message.

## 2020-05-23 NOTE — Telephone Encounter (Signed)
Rec'd return call from [name], and after discussing Palliative services she was in agreement with scheduling visit.  I have scheduled an In-person Consult for 06/13/20 @ 2 PM

## 2020-05-24 ENCOUNTER — Ambulatory Visit: Payer: Medicare Other

## 2020-05-24 ENCOUNTER — Other Ambulatory Visit: Payer: Self-pay

## 2020-05-24 DIAGNOSIS — G8929 Other chronic pain: Secondary | ICD-10-CM

## 2020-05-24 DIAGNOSIS — R2689 Other abnormalities of gait and mobility: Secondary | ICD-10-CM

## 2020-05-24 DIAGNOSIS — M6281 Muscle weakness (generalized): Secondary | ICD-10-CM

## 2020-05-24 DIAGNOSIS — R2681 Unsteadiness on feet: Secondary | ICD-10-CM | POA: Diagnosis not present

## 2020-05-24 DIAGNOSIS — M545 Low back pain, unspecified: Secondary | ICD-10-CM

## 2020-05-24 NOTE — Therapy (Signed)
[place] MAIN [facility] SERVICES [address] [name], [place], [address] Phone: [phone]   Fax:  [phone]  Physical Therapy Treatment Physical Therapy Progress Note   Dates of reporting period  04/17/20   to   05/24/20  Patient Details  Name: [name] MRN: [id number] Date of Birth: 1934-08-14 Referring Provider (PT): Meeler, Sherren Kerns FNP   Encounter Date: 05/24/2020   PT End of Session - 05/25/20 0809    Visit Number 20    Number of Visits 28    Date for PT Re-Evaluation 06/19/20    Authorization Type 10/10 PN 04/17/20 ; next session 1/10 PN 9/23    PT Start Time 0930    PT Stop Time 1014    PT Time Calculation (min) 44 min    Equipment Utilized During Treatment Gait belt    Activity Tolerance Patient tolerated treatment well;Patient limited by fatigue    Behavior During Therapy WFL for tasks assessed/performed           Past Medical History:  Diagnosis Date  . AAA (abdominal aortic aneurysm) ([place]) 05/2019   [place] Rapid progression from June-Aug 2020 (4.6 - 5.4 cm): s/p EVAR (Dr. Lucky Cowboy [facility]): EVAR - 23 mm prox, 12 cm distal & x 18 cm length Gore Excluder Endoprosthesis Main body (via RFA distal to lowest Renal A); 19mm x 14 cm L Contralateral Limb for L Iliac - extended with 8 mm x 6cm LifeStar stent (post-dilated with 7 mm DEB).  Additional R Iliac PTA not required.   . Arthritis   . Basal cell carcinoma of eyelid 01/03/2013   2019 - R side of Nose  . Benign neoplasm of ascending colon   . Benign neoplasm of descending colon   . Bilateral iliac artery stenosis ([place]) 2007   Bilateral Iliac A stenting; extension of EVAR limbs into both Iliacs -- additional stent placed in L Iliac.  . Cancer ([place])    Skin cancer  . Colon polyps   . Complication of anesthesia    severe confusion agitation requiring hospital admission x 4 days  . Compression fracture of fourth lumbar vertebra ([place]) 04/2019   - s/p Kyphoplasty; T12,  L3-5 [facility])  . COPD (chronic obstructive pulmonary disease) (HCC)    Centrilobular Emphysema  . Coronary artery disease involving native coronary artery without angina pectoris 1998   - s/p CABG, occluded SVG-D1; patent LIMA-LAD, SVG-OM, SVG- RCA  . Dementia ([place])    memory loss - started noting late 2019  . Falls frequently    broke foot  . Fracture 2021   left foot  . GERD (gastroesophageal reflux disease)   . Hemorrhoid 02/10/2015  . History of hiatal hernia   . History of kidney stones   . HOH (hard of hearing)   . Hypertension   . Lewy body dementia ([place]) 04/04/2020  . Macular degeneration disease   . Osteoporosis   . PAF (paroxysmal atrial fibrillation) ([place])    on Eliquis for OAC - Rate Control   . Prostate cancer ([place])   . Prostate disorder   . Sciatic pain    Chronic  . Sleep apnea    cpap  . Temporary cerebral vascular dysfunction 02/10/2015   Had negative work-up.  July, 2012.  No medication changes, had forgot them that day, got dehydrated.   . Urinary incontinence     Past Surgical History:  Procedure Laterality Date  . ABDOMINAL AORTIC ENDOVASCULAR STENT GRAFT Bilateral 05/18/2019  Procedure: ABDOMINAL AORTIC ENDOVASCULAR STENT GRAFT;  Surgeon: Algernon Huxley, MD;  Location: [facility] ORS;  Service: Vascular;  Laterality: Bilateral;  . ANKLE SURGERY Left    ORIF   . Arch aortogram and carotid aortogram  06/02/2006   Dr Oneida Alar did surgery  . BASAL CELL CARCINOMA EXCISION  04/2016   Dermatology  . CARDIAC CATHETERIZATION  01/30/1998    (Dr. Linard Millers): Native LAD & mRCA CTO. 90% D1. Mod Cx. SVG-D1 CTO. SVG-RCA & SVG-OM along with LIMA-LAD patent;  LV NORMAL Fxn  . CAROTID ENDARTERECTOMY Left Oct. 29, 2007   Dr. Oneida Alar:  . CATARACT EXTRACTION W/PHACO Right 03/05/2017   Procedure: CATARACT EXTRACTION PHACO AND INTRAOCULAR LENS PLACEMENT (IOC);  Surgeon: Leandrew Koyanagi, MD;  Location: [facility] ORS;  Service: Ophthalmology;  Laterality: Right;  [place] 00:58.5 AP% 10.6 CDE  6.20 Fluid Pack lot # [id number]  . CATARACT EXTRACTION W/PHACO Left 04/15/2017   Procedure: CATARACT EXTRACTION PHACO AND INTRAOCULAR LENS PLACEMENT ([place])  Left;  Surgeon: Leandrew Koyanagi, MD;  Location: [facility];  Service: Ophthalmology;  Laterality: Left;  . COLONOSCOPY WITH PROPOFOL N/A 09/15/2017   Procedure: COLONOSCOPY WITH PROPOFOL;  Surgeon: Lucilla Lame, MD;  Location: [facility] ENDOSCOPY;  Service: Endoscopy;  Laterality: N/A;  . COLONOSCOPY WITH PROPOFOL N/A 10/26/2018   Procedure: COLONOSCOPY WITH PROPOFOL;  Surgeon: Lucilla Lame, MD;  Location: [facility] ENDOSCOPY;  Service: Endoscopy;  Laterality: N/A;  . CORONARY ARTERY BYPASS GRAFT  1998   LIMA-LAD, SVG-OM, SVG-RPDA, SVG-DIAG  . CPET / MET  12/2012   Mild chronotropic incompetence - read 82% of predicted; also reduced effort; peak VO2 15.7 / 75% (did not reach Max effort) -- suggested ischemic response in last 1.5 minutes of exercise. Normal pulmonary function on PFTs but poor response to  . CYSTOSCOPY W/ RETROGRADES Left 01/13/2020   Procedure: CYSTOSCOPY WITH RETROGRADE PYELOGRAM;  Surgeon: Billey Co, MD;  Location: [facility] ORS;  Service: Urology;  Laterality: Left;  . CYSTOSCOPY WITH STENT PLACEMENT Left 12/26/2019   Procedure: CYSTOSCOPY WITH STENT PLACEMENT;  Surgeon: Billey Co, MD;  Location: [facility] ORS;  Service: Urology;  Laterality: Left;  . CYSTOSCOPY/URETEROSCOPY/HOLMIUM LASER/STENT PLACEMENT Left 01/13/2020   Procedure: CYSTOSCOPY/URETEROSCOPY/HOLMIUM LASER/STENT EXCHANGE;  Surgeon: Billey Co, MD;  Location: [facility] ORS;  Service: Urology;  Laterality: Left;  . ENDOVASCULAR REPAIR/STENT GRAFT N/A 05/18/2019   Procedure: ENDOVASCULAR REPAIR/STENT GRAFT;  Surgeon: Algernon Huxley, MD;  Location: [facility] INVASIVE CV LAB;; EVAR - 23 mm prox, 12 cm distal & x 18 cm length Gore Excluder Endoprosthesis Main body (via RFA distal to lowest Renal A); 71m x 14 cm L Contralateral Limb for L Iliac - extended with 8 mm x 6cm  LifeStar stent; no additional R Iliac PTA needed.   . ESOPHAGOGASTRODUODENOSCOPY (EGD) WITH PROPOFOL N/A 09/15/2017   Procedure: ESOPHAGOGASTRODUODENOSCOPY (EGD) WITH PROPOFOL;  Surgeon: WLucilla Lame MD;  [facility];  Service: Endoscopy;  Laterality: N/A;  . FRACTURE SURGERY Right 03/19/2015   wrist  . FRACTURE SURGERY Left   . HIATAL HERNIA REPAIR    . ILIAC ARTERY STENT  12/15/2005   PTA and direct stenting rgt and lft common iliac arteries  . KYPHOPLASTY N/A 04/05/2019   Procedure: KYPHOPLASTY T12, L3, L4 L5;  Surgeon: MHessie Knows MD;  Location: [facility] ORS;  Service: Orthopedics;  Laterality: N/A;  . NM GATED MYOVIEW ([place])  05/'19; 8/'20   a) [facility]: Low Risk - no ischemia or infarct.  EF > 65%. no RWMA;; b) ([facility]): Normal wall motion.  No ischemia or infarction.  . OPEN REDUCTION INTERNAL FIXATION (ORIF) DISTAL RADIAL FRACTURE Left 01/13/2019   Procedure: OPEN REDUCTION INTERNAL FIXATION (ORIF) DISTAL  RADIAL FRACTURE - LEFT - SLEEP APNEA;  Surgeon: Hessie Knows, MD;  Location: [facility] ORS;  Service: Orthopedics;  Laterality: Left;  . ORIF ELBOW FRACTURE Right 01/01/2017   Procedure: OPEN REDUCTION INTERNAL FIXATION (ORIF) ELBOW/OLECRANON FRACTURE;  Surgeon: Hessie Knows, MD;  Location: [facility] ORS;  Service: Orthopedics;  Laterality: Right;  . ORIF WRIST FRACTURE Right 03/19/2015   Procedure: OPEN REDUCTION INTERNAL FIXATION (ORIF) WRIST FRACTURE;  Surgeon: Hessie Knows, MD;  Location: [facility] ORS;  Service: Orthopedics;  Laterality: Right;  . SHOULDER ARTHROSCOPY Right   . THORACIC AORTA - CAROTID ANGIOGRAM  October 2007   Dr. 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 TRANSTHORACIC ECHOCARDIOGRAM  February 2016   [facility]: Normal LV function. Dilated left atrium.  Domingo Dimes ECHOCARDIOGRAM  04/2019   [facility] April 20, 2019): EF 55-60 %.  Mild LVH.  Relatively normal valves.     There were no vitals filed for this visit.   Subjective Assessment - 05/25/20 0807    Subjective Patient reports compliance with HEP, no falls or LOB since last session. Is ready to perform his long walk test, says he hasn't been able to walk as much at home and wants to walk more.    Pertinent History Patient is a pleasant 86 year old who was last seen by this PT on 12/22/19; since then has been admitted to the hospital and had other medical complications including: two surgeries for kidney stones, another one for his broken L foot. Was in a boot for 6 weeks (boot came off a week ago), and procedures for his back. Has been having hallucinations during the day and night time with increased rate frequently  Pain in back is associated with weakness, had facet joint injections. He has a history of kyphoplasty to T12, L3, L4, L5 performed in August 2020 by Dr. Rudene Christians. MRI from [facility] dated 03/28/2019 report was reviewed today. Acute compression fractures are noted at T12, L3, and L4. He has facet arthropathy at the lower lumbar levels. At L3-4 he has moderate central stenosis. PMH includes AAA, arthritis, atherosclerotic heart disease (s/p CABG), benign neoplasm of ascending/descending colon, COPD, CAD, dementia, dysrhythmia, falls, GERD, hemorrhoid, HOH, HTN, PAF, prostate cancer, osteoporosis, kidney stones, macular degeneration.    Limitations Walking;Standing;House hold activities;Other (comment);Lifting;Sitting    How long can you sit comfortably? 5 minutes    How long can you stand comfortably? needs a walker now, unsteady once standing    How long can you walk comfortably? uses a walker now; can walk in house.    Diagnostic tests MRI from [facility] dated 03/28/2019 report was reviewed today. Acute compression fractures are noted at T12, L3, and L4. He has facet arthropathy at the lower lumbar levels. At L3-4 he has moderate central stenosis.    Patient Stated  Goals decrease pain and improve balance.    Currently in Pain? No/denies               Goals FOTO: 45.8 %  6 minute walk test: 882 ft with rollator    Vitals: 116/57 pulse 61    Standing with 4# Ankle Weight: BUE support on bar  Hamstring curls 10x each leg  Hip extension 10x each  leg  Hip Abduction 10x each leg     Standing with RW: No weights:  STS 10x.  Rollator in front for safety. SPT cues for 1/1 hand placement throughout task for inc safety, as pt would initiate with both hands on rollator.   In [employer]  airex pad: static stand 30 seconds slight posterior sway Squat with UE support on bar 12x cues for upright posture   Seated:   LAQ with 4# AW. PT provides cues to hold end position to maximize contraction. 10x each LE, 3 second holds   March with #4 lb ankle weight, cues for arm crossed upright posture 10x each LE     Pt educated throughout session about proper posture and technique with exercises. Improved exercise technique, movement at target joints, use of target muscles after min to mod verbal, visual, tactile cues.   PT monitored vitals throughout session to ensure safe therapeutic ranges.       Patient's condition has the potential to improve in response to therapy. Maximum improvement is yet to be obtained. The anticipated improvement is attainable and reasonable in a generally predictable time.  Patient reports he is feeling more steady but is limited at home, reports he has limited control of his life due to increased caregiver required for memory.                   PT Education - 05/25/20 0808    Education provided Yes    Education Details goals, exercise technique    Person(s) Educated Patient    Methods Explanation;Demonstration;Tactile cues;Verbal cues    Comprehension Verbalized understanding;Returned demonstration;Verbal cues required;Tactile cues required            PT Short Term Goals - 05/25/20 0814      PT  SHORT TERM GOAL #1   Title Patient will be independent in home exercise program to improve strength/mobility for better functional independence with ADLs.    Baseline 6/29: HEP given 8/17: HEP compliant with son 8/24: compliant with son    Time 4    Period Weeks    Status Achieved    Target Date 03/27/20      PT SHORT TERM GOAL #2   Title Patient will perform 5 sit to stand transfers without UE support to increase functional mobility for carryover to community activities.    Baseline 6/29; only able to perform 2 8/17: able to perform 4 9/24 : able to perform 5    Time 4    Period Weeks    Status Achieved    Target Date 05/22/20             PT Long Term Goals - 05/25/20 0814      PT LONG TERM GOAL #1   Title Patient will increase FOTO score to equal to or greater than 54% to demonstrate statistically significant improvement in mobility and quality of life.    Baseline 6/29: 54% 8/17: 43.6% 9/24: 45.8%    Time 8    Period Weeks    Status Partially Met    Target Date 06/19/20      PT LONG TERM GOAL #2   Title Patient will demonstrate an improved Berg Balance Score of > 36 as to demonstrate improved balance with ADLs such as sitting/standing and transfer balance and reduced fall risk.    Baseline 6/29: 30/56 8/17: 37/56    Time 8    Period Weeks    Status Achieved  PT LONG TERM GOAL #3   Title Patient will increase 10 meter walk test to >1.44m/s as to improve gait speed for better community ambulation and to reduce fall risk.    Baseline 6/29: 0.50m/s with RW 8/17: >1.0 with rollator    Time 8    Period Weeks    Status Achieved      PT LONG TERM GOAL #4   Title Patient will increase BLE gross strength to 4+/5 as to improve functional strength for independent gait, increased standing tolerance and increased ADL ability.    Baseline 6/29: see note 8/17: see note    Time 8    Period Weeks    Status Partially Met    Target Date 06/19/20      PT LONG TERM GOAL #5    Title Patient will be able to perform household work/ chores without increase in symptoms.    Baseline 6/29: unable to perform 8/17: limited by balance and cognition as well as pain 9/24: cognition and balance limit not pain anymore    Time 8    Period Weeks    Status Achieved      PT LONG TERM GOAL #6   Title Patient will increase six minute walk test distance to >1000 for progression to community ambulator and improve gait ability    Baseline 9/24: 882 ft with rollator    Time 8    Period Weeks    Status New    Target Date 06/19/20                 Plan - 05/25/20 0820    Clinical Impression Statement Patient is progressing with functional mobility and stability with decreased need for UE support with transfers and increased capacity for ambulation without limitation of back pain. Patient reports no longer having back pain be a limitation or factor in his life. Focus on stabilization and mobility can be primary focus at this point forward. Patient's condition has the potential to improve in response to therapy. Maximum improvement is yet to be obtained. The anticipated improvement is attainable and reasonable in a generally predictable time.He would continue to benefit from skilled PT to increase strength/stability for safer ADL performance, decreased fall risk, and increased functional ability.    Personal Factors and Comorbidities Age;Comorbidity 3+;Fitness;Past/Current Experience;Sex;Social Background;Time since onset of injury/illness/exacerbation;Transportation    Comorbidities AAA, arthritis, atherosclerotic heart disease, (s/p CABG), benign neoplasm of ascending/descending colon, COPD, CAD, dementia, dysrhythmia, frequent falls, GERD, hemorrhoid, HOH, HTN, PAF, prostate cancer.    Examination-Activity Limitations Bathing;Bend;Carry;Dressing;Continence;Stairs;Squat;Reach Overhead;Locomotion Level;Lift;Stand;Transfers;Toileting;Bed Mobility    Examination-Participation Restrictions  Church;Cleaning;Community Activity;Driving;Interpersonal Relationship;Laundry;Volunteer;Shop;Personal Finances;Meal Prep;Yard Work    [employer] Evolving/Moderate complexity    Rehab Potential Fair    PT Frequency 2x / week    PT Duration 8 weeks    PT Treatment/Interventions ADLs/Self Care Home Management;Aquatic Therapy;Cryotherapy;Moist Heat;Traction;Ultrasound;Therapeutic activities;Functional mobility training;Stair training;Gait training;DME Instruction;Therapeutic exercise;Balance training;Neuromuscular re-education;Patient/family education;Manual techniques;Passive range of motion;Energy conservation;Vestibular;Taping;Biofeedback;Electrical Stimulation;Iontophoresis 4mg /ml Dexamethasone;Dry needling;Visual/perceptual remediation/compensation    PT Next Visit Plan core stability, LE strength, balance    PT Home Exercise Plan see above    Consulted and Agree with Plan of Care Patient           Patient will benefit from skilled therapeutic intervention in order to improve the following deficits and impairments:  Abnormal gait, Cardiopulmonary status limiting activity, Decreased activity tolerance, Decreased balance, Decreased knowledge of precautions, Decreased endurance, Decreased coordination, Decreased cognition, Decreased knowledge of use of DME, Decreased mobility,  Difficulty walking, Decreased safety awareness, Decreased strength, Impaired flexibility, Impaired perceived functional ability, Impaired sensation, Postural dysfunction, Improper body mechanics, Pain, Impaired vision/preception, Decreased range of motion  Visit Diagnosis: Unsteadiness on feet  Muscle weakness (generalized)  Other abnormalities of gait and mobility  Chronic low back pain, unspecified back pain laterality, unspecified whether sciatica present     Problem List Patient Active Problem List   Diagnosis Date Noted  . LBD (Lewy body dementia) ([place]) 05/14/2020  . Hematuria   .  Acute kidney injury superimposed on CKD ([place])   . Sepsis due to gram-negative UTI ([place]) 12/26/2019  . Left nephrolithiasis 12/26/2019  . Hydronephrosis, left 12/26/2019  . Status post abdominal aortic aneurysm (AAA) repair 04/15/2019  . Malignant neoplasm of descending colon ([place])   . Do not intubate, cardiopulmonary resuscitation (CPR)-only code status 10/08/2018  . Prostate cancer ([place]) 02/25/2018  . Prostatic cyst 01/15/2018  . 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 ([place]) 09/15/2017  . Blood in stool   . Abnormal feces   . Stricture and stenosis of esophagus   . Mild cognitive impairment 08/18/2017  . Senile purpura ([place]) 04/29/2017  . Anemia 04/29/2017  . Frequent falls 04/29/2017  . Simple chronic bronchitis ([place]) 12/17/2016  . Benign localized hyperplasia of prostate with urinary obstruction 07/15/2016  . Elevated PSA 07/15/2016  . Incomplete emptying of bladder 07/15/2016  . Urge incontinence 07/15/2016  . Hypothyroidism 04/25/2016  . Macular degeneration 04/25/2016  . Erectile dysfunction due to arterial insufficiency   . Ischemic heart disease with chronotropic incompetence   . CN (constipation) 02/10/2015  . DD (diverticular disease) 02/10/2015  . Weakness 02/10/2015  . Lichen planus 56/43/3295  . Restless leg 02/10/2015  . 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 B12 deficiency 02/10/2015  . COPD (chronic obstructive pulmonary disease) ([place]) 11/14/2014  . Post Inflammatory Lung Changes 11/14/2014  . Acute delirium 11/08/2014  . PAF (paroxysmal atrial fibrillation) (HCC) CHA2DS2-VASc = 4. AC = Eliquis   . Insomnia 12/09/2013  . OSA on CPAP   . Dyspnea on exertion - -essentially resolved with BB dose reduction & wgt loss 03/29/2013  . Overweight (BMI 25.0-29.9) -- Wgt back up 01/17/2013  . Atherosclerotic heart disease of native coronary artery without angina pectoris - s/p CABG, occluded SVG-D1; patent LIMA-LAD, SVG-OM, SVG- RCA   . Essential hypertension   .  Hyperlipidemia with target LDL less than 70   . Aorto-iliac disease ([place])   . BCC (basal cell carcinoma), eyelid 01/03/2013  . Allergic rhinitis 08/17/2009  . Adaptation reaction 05/28/2009  . Acid reflux 05/28/2009  . Arthritis, degenerative 05/28/2009  . 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 Carotid artery occlusion without infarction 06/01/2006  . PAD (peripheral artery disease) - bilateral common iliac stents 12/15/2005  . Atherosclerotic heart disease of artery bypass graft 09/01/1997   Janna Arch, PT, DPT   05/25/2020, 8:21 AM  [place] [facility]IN [facility] SERVICES [address] [place], [place], [address] Phone: [phone]   Fax:  [phone]  Name: [name] MRN: [id number] Date of Birth: [birth year]

## 2020-05-28 ENCOUNTER — Other Ambulatory Visit: Payer: Self-pay

## 2020-05-28 ENCOUNTER — Ambulatory Visit (INDEPENDENT_AMBULATORY_CARE_PROVIDER_SITE_OTHER): Payer: Medicare Other | Admitting: Family Medicine

## 2020-05-28 ENCOUNTER — Encounter: Payer: Self-pay | Admitting: Family Medicine

## 2020-05-28 VITALS — BP 138/59 | HR 62 | Temp 98.4°F | Ht 70.0 in | Wt 178.8 lb

## 2020-05-28 DIAGNOSIS — I10 Essential (primary) hypertension: Secondary | ICD-10-CM

## 2020-05-28 DIAGNOSIS — R410 Disorientation, unspecified: Secondary | ICD-10-CM

## 2020-05-28 DIAGNOSIS — I2510 Atherosclerotic heart disease of native coronary artery without angina pectoris: Secondary | ICD-10-CM | POA: Diagnosis not present

## 2020-05-28 DIAGNOSIS — R29898 Other symptoms and signs involving the musculoskeletal system: Secondary | ICD-10-CM

## 2020-05-28 DIAGNOSIS — R0989 Other specified symptoms and signs involving the circulatory and respiratory systems: Secondary | ICD-10-CM | POA: Diagnosis not present

## 2020-05-28 MED ORDER — MELATONIN 3 MG PO TABS: 1.5000 mg | ORAL_TABLET | Freq: Every day | ORAL | 0 refills | Status: DC

## 2020-05-28 NOTE — Patient Instructions (Signed)
.   Please review the attached list of medications and notify my office if there are any errors.   The CDC recommends two doses of Shingrix (the shingles vaccine) separated by 2 to 6 months for adults age 84 years and older. I recommend checking with your pharmacy plan regarding coverage for this vaccine.   . I recommend that you get a flu vaccine this year. You can get the flu vaccine at the same time at the Shingles vaccine. Otherwise you have to wait a month between the two  . You can get the flu vaccine and the shingles vaccine two weeks after getting your Covid booster vaccine

## 2020-05-28 NOTE — Progress Notes (Signed)
Established patient visit   Patient: [name]   DOB: November 08, 1933   86 y.o. Male  MRN: [id number] Visit Date: 05/28/2020  Today's healthcare provider: Lelon Huh, MD   Chief Complaint  Patient presents with  . Hypertension   Subjective    HPI   Patient's spouse says that he has some memory loss and confusion that has gotten worse since they were here 2 weeks ago.   Hypertension, follow-up  BP Readings from Last 3 Encounters:  05/14/20 133/74  04/15/20 (!) 166/80  04/11/20 127/78   Wt Readings from Last 3 Encounters:  05/14/20 180 lb (81.6 kg)  04/15/20 186 lb (84.4 kg)  04/11/20 186 lb (84.4 kg)     He was last seen for hypertension 2 weeks ago.  BP at that visit was 133/74. Management since that visit includes Change metoprolol tartate to - metoprolol succinate and stopping tamsulosin due to labile blood pressure and weak spells.  [name]  He reports excellent compliance with treatment. He is not having side effects.  He is following a Regular diet. He is exercising. He does not smoke.  Use of agents associated with hypertension: none.   Outside blood pressures are usually normal and there have been no more hypotensive episodes, but was noted to be 150/110 this morning.   --------------------------------------------------------------------------------------------------- His wife also reports that patient has been more confused the last couple of weeks. He is followed by Dr. Brigitte Pulse for dementia thought secondary to Lewy body dementia. He has tried donepezil in the past which he did not tolerate and she thinks he tried Exelon at some point, but they prefer not to add any new medications.   He did have melatonin added to his medication regiment last month and is sleeping better, but more confused during the day.      Medications: Outpatient Medications Prior to Visit  Medication Sig  . acetaminophen (TYLENOL) 500 MG tablet Take 500-1,000 mg by mouth every 6  (six) hours as needed for mild pain or moderate pain.   . Ascorbic Acid (VITAMIN C) 1000 MG tablet Take 1,000 mg by mouth daily.  . Cyanocobalamin (VITAMIN B-12) 500 MCG SUBL Place 500 mcg under the tongue daily.   [name] ELIQUIS 5 MG TABS tablet TAKE 1 TABLET BY MOUTH TWICE DAILY  . ezetimibe (ZETIA) 10 MG tablet TAKE 1 TABLET BY MOUTH DAILY  . finasteride (PROSCAR) 5 MG tablet TAKE ONE TABLET BY MOUTH EVERY DAY  . fluticasone (FLONASE) 50 MCG/ACT nasal spray PLACE TWO SPRAYS IN BOTH NOSRTILS EVERY DAY AS NEEDED FOR ALLERGIES  . Magnesium Oxide 500 MG TABS Take 500 mg by mouth every evening.  . Melatonin 3 MG CAPS Take 1 capsule by mouth at bedtime.  . metoprolol succinate (TOPROL-XL) 50 MG 24 hr tablet Take 1 tablet (50 mg total) by mouth daily. Take with or immediately following a meal.  . Multiple Vitamins-Minerals (PRESERVISION AREDS 2 PO) Take 1 tablet by mouth 2 (two) times daily.   [name] MYRBETRIQ 50 MG TB24 tablet TAKE ONE TABLET BY MOUTH EVERY DAY  . nitroGLYCERIN (NITROSTAT) 0.4 MG SL tablet Place 1 tablet (0.4 mg total) under the tongue every 5 (five) minutes as needed for chest pain.  . pantoprazole (PROTONIX) 40 MG tablet TAKE ONE TABLET BY MOUTH EVERY DAY  . QUEtiapine (SEROQUEL) 25 MG tablet Take 12.5 mg by mouth.   . rosuvastatin (CRESTOR) 20 MG tablet TAKE ONE TABLET BY MOUTH EVERY DAY  . umeclidinium-vilanterol [facility]  ELLIPTA) 62.5-25 MCG/INH AEPB Inhale 1 puff into the lungs daily.   [name] lisinopril (ZESTRIL) 2.5 MG tablet Take 1 tablet (2.5 mg total) by mouth daily.  . tamsulosin (FLOMAX) 0.4 MG CAPS capsule TAKE 1 CAPSULE BY MOUTH EVERY DAY IN THEEVENING (Patient not taking: Reported on 05/28/2020)   No facility-administered medications prior to visit.    Review of Systems    Objective    BP (!) 138/59 (BP Location: Right Arm, Patient Position: Sitting, Cuff Size: Large)   Pulse 62   Temp 98.4 F (36.9 C) (Oral)   Ht 5\' 10"  (1.778 m)   Wt 178 lb 12.8 oz (81.1 kg)   BMI  25.66 kg/m    Physical Exam   General: Appearance:     Overweight male in no acute distress  Eyes:    PERRL, conjunctiva/corneas clear, EOM's intact       Lungs:     Clear to auscultation bilaterally, respirations unlabored  Heart:    Normal heart rate. Regular rhythm. No murmurs, rubs, or gallops.   MS:   All extremities are intact.   Neurologic:   Awake, alert, oriented x 3. Generally weak, requires walker.        No results found for any visits on 05/28/20.  Assessment & Plan     1. Essential hypertension Fairly well controlled with occasional BP spikes at home. Continue current medications.    2. Labile blood pressure No more hypotension since change from metoprolol tartate to succinate and discontinuation of tamsulosin. Continue current medications.    3. Confusion Followed by Dr. Brigitte Pulse for Lewy body dementia. Only other recent medication change was addition of melatonin which has helped sleep. They can try reducing dose of melatonin, but if that doesn't help they will need to follow up with Dr. Manuella Ghazi  4. Weakness of both lower extremities He is requiring a walker and scheduled to start back with PT next week. He did lose balance while trying to turn around walker today, fell into a wall and down to floor. He had small laceration of right knuckle and left ear which were bandaged. No hip pain, leg pain, or scalp pain. He was able to ambulated out of office using walker without any additional assistance.   Follow up three months.    He anticipates getting flu shot at his pharmacy.        The entirety of the information documented in the History of Present Illness, Review of Systems and Physical Exam were personally obtained by me. Portions of this information were initially documented by the CMA and reviewed by me for thoroughness and accuracy.       Lelon Huh, MD  [facility] [phone] (phone) [phone] (fax)  [place]

## 2020-05-29 ENCOUNTER — Other Ambulatory Visit: Payer: Self-pay

## 2020-05-29 ENCOUNTER — Ambulatory Visit: Payer: Medicare Other

## 2020-05-29 DIAGNOSIS — R2681 Unsteadiness on feet: Secondary | ICD-10-CM

## 2020-05-29 DIAGNOSIS — R2689 Other abnormalities of gait and mobility: Secondary | ICD-10-CM

## 2020-05-29 DIAGNOSIS — M6281 Muscle weakness (generalized): Secondary | ICD-10-CM

## 2020-05-29 NOTE — Therapy (Signed)
[place] MAIN [facility] SERVICES [address] [place], [place], [address] Phone: [phone]   Fax:  [phone]  Physical Therapy Treatment  Patient Details  Name: [name] MRN: [id number] Date of Birth: 1934/03/10 Referring Provider (PT): Meeler, Sherren Kerns FNP   Encounter Date: 05/29/2020   PT End of Session - 05/29/20 1115    Visit Number 21    Number of Visits 28    Date for PT Re-Evaluation 06/19/20    Authorization Type 1/10 PN 05/24/20    PT Start Time 0930    PT Stop Time 1010    PT Time Calculation (min) 40 min    Equipment Utilized During Treatment Gait belt    Activity Tolerance Patient tolerated treatment well;Patient limited by fatigue    Behavior During Therapy WFL for tasks assessed/performed           Past Medical History:  Diagnosis Date   AAA (abdominal aortic aneurysm) ([place]) 05/2019   [place] Rapid progression from June-Aug 2020 (4.6 - 5.4 cm): s/p EVAR (Dr. Lucky Cowboy [facility]): EVAR - 23 mm prox, 12 cm distal & x 18 cm length Gore Excluder Endoprosthesis Main body (via RFA distal to lowest Renal A); 78m x 14 cm L Contralateral Limb for L Iliac - extended with 8 mm x 6cm LifeStar stent (post-dilated with 7 mm DEB).  Additional R Iliac PTA not required.    Arthritis    Basal cell carcinoma of eyelid 01/03/2013   2019 - R side of Nose   Benign neoplasm of ascending colon    Benign neoplasm of descending colon    Bilateral iliac artery stenosis ([place] 2007   Bilateral Iliac A stenting; extension of EVAR limbs into both Iliacs -- additional stent placed in L Iliac.   Cancer ([facility]    Skin cancer   Colon polyps    Complication of anesthesia    severe confusion agitation requiring hospital admission x 4 days   Compression fracture of fourth lumbar vertebra ([place] 04/2019   - s/p Kyphoplasty; T12, L3-5 (*[facility])   COPD (chronic obstructive pulmonary disease) (HCC)    Centrilobular Emphysema   Coronary artery  disease involving native coronary artery without angina pectoris 1998   - s/p CABG, occluded SVG-D1; patent LIMA-LAD, SVG-OM, SVG- RCA   Dementia ([place]    memory loss - started noting late 2019   Falls frequently    broke foot   Fracture 2021   left foot   GERD (gastroesophageal reflux disease)    Hemorrhoid 02/10/2015   History of hiatal hernia    History of kidney stones    HOH (hard of hearing)    Hypertension    Lewy body dementia ([place] 04/04/2020   Macular degeneration disease    Osteoporosis    PAF (paroxysmal atrial fibrillation) (HCC)    on Eliquis for OAC - Rate Control    Prostate cancer (HCC)    Prostate disorder    Sciatic pain    Chronic   Sleep apnea    cpap   Temporary cerebral vascular dysfunction 02/10/2015   Had negative work-up.  July, 2012.  No medication changes, had forgot them that day, got dehydrated.    Urinary incontinence     Past Surgical History:  Procedure Laterality Date   ABDOMINAL AORTIC ENDOVASCULAR STENT GRAFT Bilateral 05/18/2019   Procedure: ABDOMINAL AORTIC ENDOVASCULAR STENT GRAFT;  Surgeon: DAlgernon Huxley MD;  Location: [facility] ORS;  Service: Vascular;  Laterality: Bilateral;  ANKLE SURGERY Left    ORIF    Arch aortogram and carotid aortogram  06/02/2006   Dr Oneida Alar did surgery   BASAL CELL CARCINOMA EXCISION  04/2016   Dermatology   CARDIAC CATHETERIZATION  01/30/1998    (Dr. Linard Millers): Native LAD & mRCA CTO. 90% D1. Mod Cx. SVG-D1 CTO. SVG-RCA & SVG-OM along with LIMA-LAD patent;  LV NORMAL Fxn   CAROTID ENDARTERECTOMY Left Oct. 29, 2007   Dr. Oneida Alar:   CATARACT EXTRACTION W/PHACO Right 03/05/2017   Procedure: CATARACT EXTRACTION PHACO AND INTRAOCULAR LENS PLACEMENT ([place]);  Surgeon: Leandrew Koyanagi, MD;  Location: [facility] ORS;  Service: Ophthalmology;  Laterality: Right;  [place] 00:58.5 AP% 10.6 CDE 6.20 Fluid Pack lot # [id number]   CATARACT EXTRACTION W/PHACO Left 04/15/2017   Procedure: CATARACT EXTRACTION  PHACO AND INTRAOCULAR LENS PLACEMENT ([place])  Left;  Surgeon: Leandrew Koyanagi, MD;  Location: [place];  Service: Ophthalmology;  Laterality: Left;   COLONOSCOPY WITH PROPOFOL N/A 09/15/2017   Procedure: COLONOSCOPY WITH PROPOFOL;  Surgeon: Lucilla Lame, MD;  Location: [facility] ENDOSCOPY;  Service: Endoscopy;  Laterality: N/A;   COLONOSCOPY WITH PROPOFOL N/A 10/26/2018   Procedure: COLONOSCOPY WITH PROPOFOL;  Surgeon: Lucilla Lame, MD;  Location: [facility] ENDOSCOPY;  Service: Endoscopy;  Laterality: N/A;   CORONARY ARTERY BYPASS GRAFT  1998   LIMA-LAD, SVG-OM, SVG-RPDA, SVG-DIAG   CPET / MET  12/2012   Mild chronotropic incompetence - read 82% of predicted; also reduced effort; peak VO2 15.7 / 75% (did not reach Max effort) -- suggested ischemic response in last 1.5 minutes of exercise. Normal pulmonary function on PFTs but poor response to   CYSTOSCOPY W/ RETROGRADES Left 01/13/2020   Procedure: CYSTOSCOPY WITH RETROGRADE PYELOGRAM;  Surgeon: Billey Co, MD;  Location: [facility] ORS;  Service: Urology;  Laterality: Left;   CYSTOSCOPY WITH STENT PLACEMENT Left 12/26/2019   Procedure: CYSTOSCOPY WITH STENT PLACEMENT;  Surgeon: Billey Co, MD;  Location: [facility] ORS;  Service: Urology;  Laterality: Left;   CYSTOSCOPY/URETEROSCOPY/HOLMIUM LASER/STENT PLACEMENT Left 01/13/2020   Procedure: CYSTOSCOPY/URETEROSCOPY/HOLMIUM LASER/STENT EXCHANGE;  Surgeon: Billey Co, MD;  Location: [facility] ORS;  Service: Urology;  Laterality: Left;   ENDOVASCULAR REPAIR/STENT GRAFT N/A 05/18/2019   Procedure: ENDOVASCULAR REPAIR/STENT GRAFT;  Surgeon: Algernon Huxley, MD;  Location: [facility] INVASIVE CV LAB;; EVAR - 23 mm prox, 12 cm distal & x 18 cm length Gore Excluder Endoprosthesis Main body (via RFA distal to lowest Renal A); 26m x 14 cm L Contralateral Limb for L Iliac - extended with 8 mm x 6cm LifeStar stent; no additional R Iliac PTA needed.    ESOPHAGOGASTRODUODENOSCOPY (EGD) WITH PROPOFOL N/A 09/15/2017    Procedure: ESOPHAGOGASTRODUODENOSCOPY (EGD) WITH PROPOFOL;  Surgeon: WLucilla Lame MD;  [facility];  Service: Endoscopy;  Laterality: N/A;   FRACTURE SURGERY Right 03/19/2015   wrist   FRACTURE SURGERY Left    HIATAL HERNIA REPAIR     ILIAC ARTERY STENT  12/15/2005   PTA and direct stenting rgt and lft common iliac arteries   KYPHOPLASTY N/A 04/05/2019   Procedure: KYPHOPLASTY T12, L3, L4 L5;  Surgeon: MHessie Knows MD;  Location: [facility] ORS;  Service: Orthopedics;  Laterality: N/A;   NM GATED MYOVIEW ([place])  05/'19; 8/'20   a) [facility]: Low Risk - no ischemia or infarct.  EF > 65%. no RWMA;; b) ([facility] [place]: Normal wall motion.  No ischemia or infarction.   OPEN REDUCTION INTERNAL FIXATION (ORIF) DISTAL RADIAL FRACTURE Left 01/13/2019   Procedure:  OPEN REDUCTION INTERNAL FIXATION (ORIF) DISTAL  RADIAL FRACTURE - LEFT - SLEEP APNEA;  Surgeon: Hessie Knows, MD;  Location: [facility] ORS;  Service: Orthopedics;  Laterality: Left;   ORIF ELBOW FRACTURE Right 01/01/2017   Procedure: OPEN REDUCTION INTERNAL FIXATION (ORIF) ELBOW/OLECRANON FRACTURE;  Surgeon: Hessie Knows, MD;  Location: [facility] ORS;  Service: Orthopedics;  Laterality: Right;   ORIF WRIST FRACTURE Right 03/19/2015   Procedure: OPEN REDUCTION INTERNAL FIXATION (ORIF) WRIST FRACTURE;  Surgeon: Hessie Knows, MD;  Location: [facility] ORS;  Service: Orthopedics;  Laterality: Right;   SHOULDER ARTHROSCOPY Right    THORACIC AORTA - CAROTID ANGIOGRAM  October 2007   Dr. 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 Dilated left atrium.   TRANSTHORACIC ECHOCARDIOGRAM  04/2019   [facility] April 20, 2019): EF 55-60 %.  Mild LVH.  Relatively normal valves.    There were no vitals filed for this visit.   Subjective Assessment - 05/29/20 0935    Subjective Patient  notes 1 fall yesterday after coming out of doctors office, stating a nurse was in front of him and he "got tangled in the doorframe". MD and nurse were present to help him from the floor. Patient has bandage around L hand    Pertinent History Patient is a pleasant 86 year old who was last seen by this PT on 12/22/19; since then has been admitted to the hospital and had other medical complications including: two surgeries for kidney stones, another one for his broken L foot. Was in a boot for 6 weeks (boot came off a week ago), and procedures for his back. Has been having hallucinations during the day and night time with increased rate frequently  Pain in back is associated with weakness, had facet joint injections. He has a history of kyphoplasty to T12, L3, L4, L5 performed in August 2020 by Dr. Rudene Christians. MRI from [facility] dated 03/28/2019 report was reviewed today. Acute compression fractures are noted at T12, L3, and L4. He has facet arthropathy at the lower lumbar levels. At L3-4 he has moderate central stenosis. PMH includes AAA, arthritis, atherosclerotic heart disease (s/p CABG), benign neoplasm of ascending/descending colon, COPD, CAD, dementia, dysrhythmia, falls, GERD, hemorrhoid, HOH, HTN, PAF, prostate cancer, osteoporosis, kidney stones, macular degeneration.    Limitations Walking;Standing;House hold activities;Other (comment);Lifting;Sitting    How long can you sit comfortably? 5 minutes    How long can you stand comfortably? needs a walker now, unsteady once standing    How long can you walk comfortably? uses a walker now; can walk in house.    Diagnostic tests MRI from [facility] dated 03/28/2019 report was reviewed today. Acute compression fractures are noted at T12, L3, and L4. He has facet arthropathy at the lower lumbar levels. At L3-4 he has moderate central stenosis.    Patient Stated Goals decrease pain and improve balance.    Currently in Pain?  No/denies            Vitals at start: 116/69, 75bpm   Treatment:  Standing in Front of Mat Table:  - On Airex Pad: Patient asked to organize cards. One suit placed on table in random order. Patient correctly organizes suit in 3 minutes.  - On Airex Pad: Progressed to organizing two suits (hearts and diamonds) to challenge cognition while balancing. Patient takes 8 minutes to  complete task without requiring a rest break. No LOB noted while reaching across midline.   Standing with 4# ankle weight: CGA for stability  -Hip extension with bilateral upper extremity support, cueing for neutral hip alignment, upright posture for optimal muscle recruitment, and sequencing, 10x each LE,  -Hip abduction with bilateral upper extremity support, cueing for neutral foot alignment for correct muscle activation, 10x each LE -Hamstring curl with bilateral upper extremity support, cueing for knee alignment for recruitment of hamstring musculature, 10x each LE   Gait Sudden start and stops to challenge dynamic balance during gait. Patient ambulates ~45f x 1 with rollator and CGA for safety. No overt LOB noted throughout trial. Patient demonstrates scissoring gait by end of gait trial.   Session ended early due to restroom needs.   Pt educated throughout session about proper posture and technique with exercises. Improved exercise technique, movement at target joints, use of target muscles after min to mod verbal, visual, tactile cues.                PT Education - 05/29/20 1114    Education provided Yes    Education Details exercise technique, body mechanics    Person(s) Educated Patient    Methods Explanation;Demonstration;Tactile cues;Verbal cues    Comprehension Verbalized understanding;Returned demonstration;Verbal cues required;Tactile cues required            PT Short Term Goals - 05/25/20 0814      PT SHORT TERM GOAL #1   Title Patient will be independent in home  exercise program to improve strength/mobility for better functional independence with ADLs.    Baseline 6/29: HEP given 8/17: HEP compliant with son 8/24: compliant with son    Time 4    Period Weeks    Status Achieved    Target Date 03/27/20      PT SHORT TERM GOAL #2   Title Patient will perform 5 sit to stand transfers without UE support to increase functional mobility for carryover to community activities.    Baseline 6/29; only able to perform 2 8/17: able to perform 4 9/24 : able to perform 5    Time 4    Period Weeks    Status Achieved    Target Date 05/22/20             PT Long Term Goals - 05/25/20 0814      PT LONG TERM GOAL #1   Title Patient will increase FOTO score to equal to or greater than 54% to demonstrate statistically significant improvement in mobility and quality of life.    Baseline 6/29: 54% 8/17: 43.6% 9/24: 45.8%    Time 8    Period Weeks    Status Partially Met    Target Date 06/19/20      PT LONG TERM GOAL #2   Title Patient will demonstrate an improved Berg Balance Score of > 36 as to demonstrate improved balance with ADLs such as sitting/standing and transfer balance and reduced fall risk.    Baseline 6/29: 30/56 8/17: 37/56    Time 8    Period Weeks    Status Achieved      PT LONG TERM GOAL #3   Title Patient will increase 10 meter walk test to >1.037m as to improve gait speed for better community ambulation and to reduce fall risk.    Baseline 6/29: 0.544mwith RW 8/17: >1.0 with rollator    Time 8    Period Weeks    Status  Achieved      PT LONG TERM GOAL #4   Title Patient will increase BLE gross strength to 4+/5 as to improve functional strength for independent gait, increased standing tolerance and increased ADL ability.    Baseline 6/29: see note 8/17: see note    Time 8    Period Weeks    Status Partially Met    Target Date 06/19/20      PT LONG TERM GOAL #5   Title Patient will be able to perform household work/ chores  without increase in symptoms.    Baseline 6/29: unable to perform 8/17: limited by balance and cognition as well as pain 9/24: cognition and balance limit not pain anymore    Time 8    Period Weeks    Status Achieved      PT LONG TERM GOAL #6   Title Patient will increase six minute walk test distance to >1000 for progression to community ambulator and improve gait ability    Baseline 9/24: 882 ft with rollator    Time 8    Period Weeks    Status New    Target Date 06/19/20                 Plan - 05/29/20 1145    Clinical Impression Statement Patient demonstrates improvements in cognitive dual task activities, as he did not require reminders for performance of tasks. Activity tolerance has progressed, as pt did not require as many therapeutic rest breaks. Patient displays characteristics of instability during gait, indicated by scissoring gait pattern. He would continue to benefit from skilled PT to increase strength/stability for safer ADL performance, decreased fall risk, and increased functional ability.    Personal Factors and Comorbidities Age;Comorbidity 3+;Fitness;Past/Current Experience;Sex;Social Background;Time since onset of injury/illness/exacerbation;Transportation    Comorbidities AAA, arthritis, atherosclerotic heart disease, (s/p CABG), benign neoplasm of ascending/descending colon, COPD, CAD, dementia, dysrhythmia, frequent falls, GERD, hemorrhoid, HOH, HTN, PAF, prostate cancer.    Examination-Activity Limitations Bathing;Bend;Carry;Dressing;Continence;Stairs;Squat;Reach Overhead;Locomotion Level;Lift;Stand;Transfers;Toileting;Bed Mobility    Examination-Participation Restrictions Church;Cleaning;Community Activity;Driving;Interpersonal Relationship;Laundry;Volunteer;Shop;Personal Finances;Meal Prep;Yard Work    [employer]chant [employer] Evolving/Moderate complexity    Rehab Potential Fair    PT Frequency 2x / week    PT Duration 8 weeks    PT  Treatment/Interventions ADLs/Self Care Home Management;Aquatic Therapy;Cryotherapy;Moist Heat;Traction;Ultrasound;Therapeutic activities;Functional mobility training;Stair training;Gait training;DME Instruction;Therapeutic exercise;Balance training;Neuromuscular re-education;Patient/family education;Manual techniques;Passive range of motion;Energy conservation;Vestibular;Taping;Biofeedback;Electrical Stimulation;Iontophoresis 28m/ml Dexamethasone;Dry needling;Visual/perceptual remediation/compensation    PT Next Visit Plan core stability, LE strength, balance    PT Home Exercise Plan see above    Consulted and Agree with Plan of Care Patient           Patient will benefit from skilled therapeutic intervention in order to improve the following deficits and impairments:  Abnormal gait, Cardiopulmonary status limiting activity, Decreased activity tolerance, Decreased balance, Decreased knowledge of precautions, Decreased endurance, Decreased coordination, Decreased cognition, Decreased knowledge of use of DME, Decreased mobility, Difficulty walking, Decreased safety awareness, Decreased strength, Impaired flexibility, Impaired perceived functional ability, Impaired sensation, Postural dysfunction, Improper body mechanics, Pain, Impaired vision/preception, Decreased range of motion  Visit Diagnosis: Unsteadiness on feet  Muscle weakness (generalized)  Other abnormalities of gait and mobility     Problem List Patient Active Problem List   Diagnosis Date Noted   LBD (Lewy body dementia) ([place] 05/14/2020   Hematuria    Acute kidney injury superimposed on CKD ([place]    Sepsis due to gram-negative UTI ([place] 12/26/2019   Left nephrolithiasis 12/26/2019  Hydronephrosis, left 12/26/2019   Status post abdominal aortic aneurysm (AAA) repair 04/15/2019   Malignant neoplasm of descending colon ([place])    Do not intubate, cardiopulmonary resuscitation (CPR)-only code status 10/08/2018    Prostate cancer ([place]) 02/25/2018   Prostatic cyst 01/15/2018   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 ([place]) 09/15/2017   Blood in stool    Abnormal feces    Stricture and stenosis of esophagus    Mild cognitive impairment 08/18/2017   Senile purpura ([place]) 04/29/2017   Anemia 04/29/2017   Frequent falls 04/29/2017   Simple chronic bronchitis ([place]) 12/17/2016   Benign localized hyperplasia of prostate with urinary obstruction 07/15/2016   Elevated PSA 07/15/2016   Incomplete emptying of bladder 07/15/2016   Urge incontinence 07/15/2016   Hypothyroidism 04/25/2016   Macular degeneration 04/25/2016   Erectile dysfunction due to arterial insufficiency    Ischemic heart disease with chronotropic incompetence    CN (constipation) 02/10/2015   DD (diverticular disease) 02/10/2015   Weakness 95/05/3266   Lichen planus 12/45/8099   Restless leg 02/10/2015   Circadian rhythm disorder 02/10/2015   B12 deficiency 02/10/2015   COPD (chronic obstructive pulmonary disease) ([place]) 11/14/2014   Post Inflammatory Lung Changes 11/14/2014   Acute delirium 11/08/2014   PAF (paroxysmal atrial fibrillation) (HCC) CHA2DS2-VASc = 4. AC = Eliquis    Insomnia 12/09/2013   OSA on CPAP    Dyspnea on exertion - -essentially resolved with BB dose reduction & wgt loss 03/29/2013   Overweight (BMI 25.0-29.9) -- Wgt back up 01/17/2013   Atherosclerotic heart disease of native coronary artery without angina pectoris - s/p CABG, occluded SVG-D1; patent LIMA-LAD, SVG-OM, SVG- RCA    Essential hypertension    Hyperlipidemia with target LDL less than 70    Aorto-iliac disease ([place])    BCC (basal cell carcinoma), eyelid 01/03/2013   Allergic rhinitis 08/17/2009   Adaptation reaction 05/28/2009   Acid reflux 05/28/2009   Arthritis, degenerative 05/28/2009   Abnormality of aortic arch branch 06/01/2006   Carotid artery occlusion without infarction 06/01/2006   PAD (peripheral  artery disease) - bilateral common iliac stents 12/15/2005   Atherosclerotic heart disease of artery bypass graft 09/01/1997   Tonny Bollman, SPT  This entire session was performed under direct supervision and direction of a licensed therapist/therapist assistant . I have personally read, edited and approve of the note as written.  Janna Arch, PT, DPT   05/29/2020, 12:52 PM  [place] MAIN [facility] SERVICES [address] [place], [place], [address] Phone: [phone]   Fax:  [phone]  Name: [name] MRN: [id number] Date of Birth: 08/21/1934

## 2020-05-31 ENCOUNTER — Other Ambulatory Visit: Payer: Self-pay

## 2020-05-31 ENCOUNTER — Inpatient Hospital Stay: Payer: Medicare Other | Attending: Radiation Oncology

## 2020-05-31 ENCOUNTER — Ambulatory Visit: Payer: Medicare Other

## 2020-05-31 DIAGNOSIS — R2681 Unsteadiness on feet: Secondary | ICD-10-CM

## 2020-05-31 DIAGNOSIS — M6281 Muscle weakness (generalized): Secondary | ICD-10-CM

## 2020-05-31 DIAGNOSIS — C61 Malignant neoplasm of prostate: Secondary | ICD-10-CM | POA: Diagnosis not present

## 2020-05-31 DIAGNOSIS — R2689 Other abnormalities of gait and mobility: Secondary | ICD-10-CM

## 2020-05-31 LAB — PSA: Prostatic Specific Antigen: <0.01 ng/mL (ref 0.00–4.00)

## 2020-05-31 NOTE — Therapy (Signed)
[place] MAIN [facility] SERVICES [address] [place], [place], [address] Phone: [phone]   Fax:  [phone]  Physical Therapy Treatment/ DISCHARGE  Patient Details  Name: [name] MRN: [id number] Date of Birth: 1934-06-20 Referring Provider (PT): Meeler, Sherren Kerns FNP   Encounter Date: 05/31/2020   PT End of Session - 05/31/20 0917    Visit Number 22    Number of Visits 28    Date for PT Re-Evaluation 06/19/20    Authorization Type 2/10 PN 05/24/20    PT Start Time 0930    PT Stop Time 1014    PT Time Calculation (min) 44 min    Equipment Utilized During Treatment Gait belt    Activity Tolerance Patient tolerated treatment well;Patient limited by fatigue    Behavior During Therapy WFL for tasks assessed/performed           Past Medical History:  Diagnosis Date   AAA (abdominal aortic aneurysm) ([place]) 05/2019   [place] Rapid progression from June-Aug 2020 (4.6 - 5.4 cm): s/p EVAR (Dr. Lucky Cowboy [facility]): EVAR - 23 mm prox, 12 cm distal & x 18 cm length Gore Excluder Endoprosthesis Main body (via RFA distal to lowest Renal A); 33m x 14 cm L Contralateral Limb for L Iliac - extended with 8 mm x 6cm LifeStar stent (post-dilated with 7 mm DEB).  Additional R Iliac PTA not required.    Arthritis    Basal cell carcinoma of eyelid 01/03/2013   2019 - R side of Nose   Benign neoplasm of ascending colon    Benign neoplasm of descending colon    Bilateral iliac artery stenosis ([place] 2007   Bilateral Iliac A stenting; extension of EVAR limbs into both Iliacs -- additional stent placed in L Iliac.   Cancer ([facility]    Skin cancer   Colon polyps    Complication of anesthesia    severe confusion agitation requiring hospital admission x 4 days   Compression fracture of fourth lumbar vertebra ([place] 04/2019   - s/p Kyphoplasty; T12, L3-5 (*[facility])   COPD (chronic obstructive pulmonary disease) (HCC)    Centrilobular Emphysema    Coronary artery disease involving native coronary artery without angina pectoris 1998   - s/p CABG, occluded SVG-D1; patent LIMA-LAD, SVG-OM, SVG- RCA   Dementia ([place]    memory loss - started noting late 2019   Falls frequently    broke foot   Fracture 2021   left foot   GERD (gastroesophageal reflux disease)    Hemorrhoid 02/10/2015   History of hiatal hernia    History of kidney stones    HOH (hard of hearing)    Hypertension    Lewy body dementia ([place] 04/04/2020   Macular degeneration disease    Osteoporosis    PAF (paroxysmal atrial fibrillation) (HCC)    on Eliquis for OAC - Rate Control    Prostate cancer (HCC)    Prostate disorder    Sciatic pain    Chronic   Sleep apnea    cpap   Temporary cerebral vascular dysfunction 02/10/2015   Had negative work-up.  July, 2012.  No medication changes, had forgot them that day, got dehydrated.    Urinary incontinence     Past Surgical History:  Procedure Laterality Date   ABDOMINAL AORTIC ENDOVASCULAR STENT GRAFT Bilateral 05/18/2019   Procedure: ABDOMINAL AORTIC ENDOVASCULAR STENT GRAFT;  Surgeon: DAlgernon Huxley MD;  Location: [facility] ORS;  Service: Vascular;  Laterality:  Bilateral;   ANKLE SURGERY Left    ORIF    Arch aortogram and carotid aortogram  06/02/2006   Dr Oneida Alar did surgery   BASAL CELL CARCINOMA EXCISION  04/2016   Dermatology   CARDIAC CATHETERIZATION  01/30/1998    (Dr. Linard Millers): Native LAD & mRCA CTO. 90% D1. Mod Cx. SVG-D1 CTO. SVG-RCA & SVG-OM along with LIMA-LAD patent;  LV NORMAL Fxn   CAROTID ENDARTERECTOMY Left Oct. 29, 2007   Dr. Oneida Alar:   CATARACT EXTRACTION W/PHACO Right 03/05/2017   Procedure: CATARACT EXTRACTION PHACO AND INTRAOCULAR LENS PLACEMENT ([place]);  Surgeon: Leandrew Koyanagi, MD;  Location: [facility] ORS;  Service: Ophthalmology;  Laterality: Right;  [place] 00:58.5 AP% 10.6 CDE 6.20 Fluid Pack lot # [id number]   CATARACT EXTRACTION W/PHACO Left 04/15/2017   Procedure:  CATARACT EXTRACTION PHACO AND INTRAOCULAR LENS PLACEMENT ([place])  Left;  Surgeon: Leandrew Koyanagi, MD;  Location: [place];  Service: Ophthalmology;  Laterality: Left;   COLONOSCOPY WITH PROPOFOL N/A 09/15/2017   Procedure: COLONOSCOPY WITH PROPOFOL;  Surgeon: Lucilla Lame, MD;  Location[facility] ENDOSCOPY;  Service: Endoscopy;  Laterality: N/A;   COLONOSCOPY WITH PROPOFOL N/A 10/26/2018   Procedure: COLONOSCOPY WITH PROPOFOL;  Surgeon: Lucilla Lame, MD;  Location: [facility] ENDOSCOPY;  Service: Endoscopy;  Laterality: N/A;   CORONARY ARTERY BYPASS GRAFT  1998   LIMA-LAD, SVG-OM, SVG-RPDA, SVG-DIAG   CPET / MET  12/2012   Mild chronotropic incompetence - read 82% of predicted; also reduced effort; peak VO2 15.7 / 75% (did not reach Max effort) -- suggested ischemic response in last 1.5 minutes of exercise. Normal pulmonary function on PFTs but poor response to   CYSTOSCOPY W/ RETROGRADES Left 01/13/2020   Procedure: CYSTOSCOPY WITH RETROGRADE PYELOGRAM;  Surgeon: Billey Co, MD;  Location: [facility] ORS;  Service: Urology;  Laterality: Left;   CYSTOSCOPY WITH STENT PLACEMENT Left 12/26/2019   Procedure: CYSTOSCOPY WITH STENT PLACEMENT;  Surgeon: Billey Co, MD;  Location: [facility] ORS;  Service: Urology;  Laterality: Left;   CYSTOSCOPY/URETEROSCOPY/HOLMIUM LASER/STENT PLACEMENT Left 01/13/2020   Procedure: CYSTOSCOPY/URETEROSCOPY/HOLMIUM LASER/STENT EXCHANGE;  Surgeon: Billey Co, MD;  Location: [facility] ORS;  Service: Urology;  Laterality: Left;   ENDOVASCULAR REPAIR/STENT GRAFT N/A 05/18/2019   Procedure: ENDOVASCULAR REPAIR/STENT GRAFT;  Surgeon: Algernon Huxley, MD;  Location: [facility] INVASIVE CV LAB;; EVAR - 23 mm prox, 12 cm distal & x 18 cm length Gore Excluder Endoprosthesis Main body (via RFA distal to lowest Renal A); 43m x 14 cm L Contralateral Limb for L Iliac - extended with 8 mm x 6cm LifeStar stent; no additional R Iliac PTA needed.    ESOPHAGOGASTRODUODENOSCOPY (EGD) WITH  PROPOFOL N/A 09/15/2017   Procedure: ESOPHAGOGASTRODUODENOSCOPY (EGD) WITH PROPOFOL;  Surgeon: WLucilla Lame MD;  [facility];  Service: Endoscopy;  Laterality: N/A;   FRACTURE SURGERY Right 03/19/2015   wrist   FRACTURE SURGERY Left    HIATAL HERNIA REPAIR     ILIAC ARTERY STENT  12/15/2005   PTA and direct stenting rgt and lft common iliac arteries   KYPHOPLASTY N/A 04/05/2019   Procedure: KYPHOPLASTY T12, L3, L4 L5;  Surgeon: MHessie Knows MD;  Location: [facility] ORS;  Service: Orthopedics;  Laterality: N/A;   NM GATED MYOVIEW ([place])  05/'19; 8/'20   a) [facility]: Low Risk - no ischemia or infarct.  EF > 65%. no RWMA;; b) ([facility]: Normal wall motion.  No ischemia or infarction.   OPEN REDUCTION INTERNAL FIXATION (ORIF) DISTAL RADIAL FRACTURE Left 01/13/2019  Procedure: OPEN REDUCTION INTERNAL FIXATION (ORIF) DISTAL  RADIAL FRACTURE - LEFT - SLEEP APNEA;  Surgeon: Hessie Knows, MD;  Location: [facility] ORS;  Service: Orthopedics;  Laterality: Left;   ORIF ELBOW FRACTURE Right 01/01/2017   Procedure: OPEN REDUCTION INTERNAL FIXATION (ORIF) ELBOW/OLECRANON FRACTURE;  Surgeon: Hessie Knows, MD;  Location: [facility] ORS;  Service: Orthopedics;  Laterality: Right;   ORIF WRIST FRACTURE Right 03/19/2015   Procedure: OPEN REDUCTION INTERNAL FIXATION (ORIF) WRIST FRACTURE;  Surgeon: Hessie Knows, MD;  Location: [facility] ORS;  Service: Orthopedics;  Laterality: Right;   SHOULDER ARTHROSCOPY Right    THORACIC AORTA - CAROTID ANGIOGRAM  October 2007   Dr. 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 Dilated left atrium.   TRANSTHORACIC ECHOCARDIOGRAM  04/2019   [facility] April 20, 2019): EF 55-60 %.  Mild LVH.  Relatively normal valves.    There were no vitals filed for this visit.   Subjective Assessment - 05/31/20 0929     Subjective Patient notes feeling "better" this morning. Denies falls or LOB since last session.    Pertinent History Patient is a pleasant 86 year old who was last seen by this PT on 12/22/19; since then has been admitted to the hospital and had other medical complications including: two surgeries for kidney stones, another one for his broken L foot. Was in a boot for 6 weeks (boot came off a week ago), and procedures for his back. Has been having hallucinations during the day and night time with increased rate frequently  Pain in back is associated with weakness, had facet joint injections. He has a history of kyphoplasty to T12, L3, L4, L5 performed in August 2020 by Dr. Rudene Christians. MRI from [facility] dated 03/28/2019 report was reviewed today. Acute compression fractures are noted at T12, L3, and L4. He has facet arthropathy at the lower lumbar levels. At L3-4 he has moderate central stenosis. PMH includes AAA, arthritis, atherosclerotic heart disease (s/p CABG), benign neoplasm of ascending/descending colon, COPD, CAD, dementia, dysrhythmia, falls, GERD, hemorrhoid, HOH, HTN, PAF, prostate cancer, osteoporosis, kidney stones, macular degeneration.    Limitations Walking;Standing;House hold activities;Other (comment);Lifting;Sitting    How long can you sit comfortably? 5 minutes    How long can you stand comfortably? needs a walker now, unsteady once standing    How long can you walk comfortably? uses a walker now; can walk in house.    Diagnostic tests MRI from [facility] dated 03/28/2019 report was reviewed today. Acute compression fractures are noted at T12, L3, and L4. He has facet arthropathy at the lower lumbar levels. At L3-4 he has moderate central stenosis.    Patient Stated Goals decrease pain and improve balance.    Currently in Pain? No/denies              Vitals: 134/67, 67bpm     Standing: - Lateral step ups on 6 step to improve frontal plane  stabilization. 10x each direction. Patient requires mod verbal and tactile cues for correct execution of task. Patient requires tactile cues for whole foot placement on step, as pt would stand on the corner of the step with both feet.  - Obstacle course consisting of cones, tall bolsters to challenge balance and functional mobility in distracting environments. Performed 2x with good clearance of obstacles. PT cues for closer proximity to rollator  during turns to inc safety.  - Progressed obstacle course to include cognitive dual tasking. Patient names 10 animals, pausing 3x to recall names. Patient has near collision with obstacles x 4, knocking over 1 cone. PT cues for closer proximity to rollator during turns to inc safety.  - Ball kicks for timing, coordination of BLEs , and sequencing of movement. One hand on bar for support. 15x. Cones set up as goal posts to challenge precision with kicks. Performed an additional 12x    In [place] of Mat Table: - On Airex Pad: Progressed to organizing two suits (clubs and spades) to challenge cognition while balancing. Patient takes 7 minutes to complete task without requiring a rest break. SPT cues for no UE support during task to further challenge balance. No overt LOB noted throughout.  Seated: - Ball passes: Patient leans forward in chair to reach for rainbow ball, then passes ball to PT behind chair. Patient then performs trunk extension to grab ball behind, passes forward. 12x each direction    Pt educated throughout session about proper posture and technique with exercises. Improved exercise technique, movement at target joints, use of target muscles after min to mod verbal, visual, tactile cues.                       PT Education - 05/31/20 0916    Education provided Yes    Education Details body mechanics, exercise technique    Person(s) Educated Patient    Methods Explanation;Demonstration;Tactile cues;Verbal cues     Comprehension Verbalized understanding;Returned demonstration;Verbal cues required;Tactile cues required            PT Short Term Goals - 05/25/20 0814      PT SHORT TERM GOAL #1   Title Patient will be independent in home exercise program to improve strength/mobility for better functional independence with ADLs.    Baseline 6/29: HEP given 8/17: HEP compliant with son 8/24: compliant with son    Time 4    Period Weeks    Status Achieved    Target Date 03/27/20      PT SHORT TERM GOAL #2   Title Patient will perform 5 sit to stand transfers without UE support to increase functional mobility for carryover to community activities.    Baseline 6/29; only able to perform 2 8/17: able to perform 4 9/24 : able to perform 5    Time 4    Period Weeks    Status Achieved    Target Date 05/22/20             PT Long Term Goals - 05/25/20 0814      PT LONG TERM GOAL #1   Title Patient will increase FOTO score to equal to or greater than 54% to demonstrate statistically significant improvement in mobility and quality of life.    Baseline 6/29: 54% 8/17: 43.6% 9/24: 45.8%    Time 8    Period Weeks    Status Partially Met    Target Date 06/19/20      PT LONG TERM GOAL #2   Title Patient will demonstrate an improved Berg Balance Score of > 36 as to demonstrate improved balance with ADLs such as sitting/standing and transfer balance and reduced fall risk.    Baseline 6/29: 30/56 8/17: 37/56    Time 8    Period Weeks    Status Achieved      PT LONG TERM GOAL #3   Title Patient will  increase 10 meter walk test to >1.57ms as to improve gait speed for better community ambulation and to reduce fall risk.    Baseline 6/29: 0.566m with RW 8/17: >1.0 with rollator    Time 8    Period Weeks    Status Achieved      PT LONG TERM GOAL #4   Title Patient will increase BLE gross strength to 4+/5 as to improve functional strength for independent gait, increased standing tolerance and increased  ADL ability.    Baseline 6/29: see note 8/17: see note    Time 8    Period Weeks    Status Partially Met    Target Date 06/19/20      PT LONG TERM GOAL #5   Title Patient will be able to perform household work/ chores without increase in symptoms.    Baseline 6/29: unable to perform 8/17: limited by balance and cognition as well as pain 9/24: cognition and balance limit not pain anymore    Time 8    Period Weeks    Status Achieved      PT LONG TERM GOAL #6   Title Patient will increase six minute walk test distance to >1000 for progression to community ambulator and improve gait ability    Baseline 9/24: 882 ft with rollator    Time 8    Period Weeks    Status New    Target Date 06/19/20                 Plan - 05/31/20 1122    Clinical Impression Statement Patient is very engaged this session and is motivated to complete exercises. Patient demonstrates improved dynamic balance and improved COM awareness, seen by no near LOB when standing unsupported on compliant surfaces for extended periods of time. Patient continues to require verbal and tactile cues for correct execution of multi-step interventions. Patient discharging from skilled PT in order to initiate LSVT treatment.    Personal Factors and Comorbidities Age;Comorbidity 3+;Fitness;Past/Current Experience;Sex;Social Background;Time since onset of injury/illness/exacerbation;Transportation    Comorbidities AAA, arthritis, atherosclerotic heart disease, (s/p CABG), benign neoplasm of ascending/descending colon, COPD, CAD, dementia, dysrhythmia, frequent falls, GERD, hemorrhoid, HOH, HTN, PAF, prostate cancer.    Examination-Activity Limitations Bathing;Bend;Carry;Dressing;Continence;Stairs;Squat;Reach Overhead;Locomotion Level;Lift;Stand;Transfers;Toileting;Bed Mobility    Examination-Participation Restrictions Church;Cleaning;Community Activity;Driving;Interpersonal Relationship;Laundry;Volunteer;Shop;Personal Finances;Meal  Prep;Yard Work    [employer] officervolving/Moderate complexity    Rehab Potential Fair    PT Frequency 2x / week    PT Duration 8 weeks    PT Treatment/Interventions ADLs/Self Care Home Management;Aquatic Therapy;Cryotherapy;Moist Heat;Traction;Ultrasound;Therapeutic activities;Functional mobility training;Stair training;Gait training;DME Instruction;Therapeutic exercise;Balance training;Neuromuscular re-education;Patient/family education;Manual techniques;Passive range of motion;Energy conservation;Vestibular;Taping;Biofeedback;Electrical Stimulation;Iontophoresis 60m16ml Dexamethasone;Dry needling;Visual/perceptual remediation/compensation    PT Next Visit Plan core stability, LE strength, balance    PT Home Exercise Plan see above    Consulted and Agree with Plan of Care Patient           Patient will benefit from skilled therapeutic intervention in order to improve the following deficits and impairments:  Abnormal gait, Cardiopulmonary status limiting activity, Decreased activity tolerance, Decreased balance, Decreased knowledge of precautions, Decreased endurance, Decreased coordination, Decreased cognition, Decreased knowledge of use of DME, Decreased mobility, Difficulty walking, Decreased safety awareness, Decreased strength, Impaired flexibility, Impaired perceived functional ability, Impaired sensation, Postural dysfunction, Improper body mechanics, Pain, Impaired vision/preception, Decreased range of motion  Visit Diagnosis: Unsteadiness on feet  Muscle weakness (generalized)  Other abnormalities of gait and mobility     Problem List Patient Active Problem  List   Diagnosis Date Noted   LBD (Lewy body dementia) ([place]) 05/14/2020   Hematuria    Acute kidney injury superimposed on CKD ([place])    Sepsis due to gram-negative UTI ([place]) 12/26/2019   Left nephrolithiasis 12/26/2019   Hydronephrosis, left 12/26/2019   Status post abdominal aortic aneurysm  (AAA) repair 04/15/2019   Malignant neoplasm of descending colon ([place])    Do not intubate, cardiopulmonary resuscitation (CPR)-only code status 10/08/2018   Prostate cancer ([place]) 02/25/2018   Prostatic cyst 01/15/2018   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 ([place]) 09/15/2017   Blood in stool    Abnormal feces    Stricture and stenosis of esophagus    Mild cognitive impairment 08/18/2017   Senile purpura ([place]) 04/29/2017   Anemia 04/29/2017   Frequent falls 04/29/2017   Simple chronic bronchitis ([place]) 12/17/2016   Benign localized hyperplasia of prostate with urinary obstruction 07/15/2016   Elevated PSA 07/15/2016   Incomplete emptying of bladder 07/15/2016   Urge incontinence 07/15/2016   Hypothyroidism 04/25/2016   Macular degeneration 04/25/2016   Erectile dysfunction due to arterial insufficiency    Ischemic heart disease with chronotropic incompetence    CN (constipation) 02/10/2015   DD (diverticular disease) 02/10/2015   Weakness 65/78/4696   Lichen planus 29/52/8413   Restless leg 02/10/2015   Circadian rhythm disorder 02/10/2015   B12 deficiency 02/10/2015   COPD (chronic obstructive pulmonary disease) ([place]) 11/14/2014   Post Inflammatory Lung Changes 11/14/2014   Acute delirium 11/08/2014   PAF (paroxysmal atrial fibrillation) (HCC) CHA2DS2-VASc = 4. AC = Eliquis    Insomnia 12/09/2013   OSA on CPAP    Dyspnea on exertion - -essentially resolved with BB dose reduction & wgt loss 03/29/2013   Overweight (BMI 25.0-29.9) -- Wgt back up 01/17/2013   Atherosclerotic heart disease of native coronary artery without angina pectoris - s/p CABG, occluded SVG-D1; patent LIMA-LAD, SVG-OM, SVG- RCA    Essential hypertension    Hyperlipidemia with target LDL less than 70    Aorto-iliac disease ([place])    BCC (basal cell carcinoma), eyelid 01/03/2013   Allergic rhinitis 08/17/2009   Adaptation reaction 05/28/2009   Acid reflux 05/28/2009    Arthritis, degenerative 05/28/2009   Abnormality of aortic arch branch 06/01/2006   Carotid artery occlusion without infarction 06/01/2006   PAD (peripheral artery disease) - bilateral common iliac stents 12/15/2005   Atherosclerotic heart disease of artery bypass graft 09/01/1997    Tonny Bollman, SPT  This entire session was performed under direct supervision and direction of a licensed therapist/therapist assistant . I have personally read, edited and approve of the note as written.  Janna Arch, PT, DPT   05/31/2020, 1:33 PM  [place] MAIN [facility] SERVICES [address] [place], [place], [address] Phone: [phone]   Fax:  [phone]  Name: SADRAC ZEOLI MRN: [id number] Date of Birth: 1933-12-06

## 2020-06-04 ENCOUNTER — Ambulatory Visit: Payer: Medicare Other | Attending: Nurse Practitioner

## 2020-06-04 ENCOUNTER — Ambulatory Visit: Payer: Medicare Other | Admitting: Speech Pathology

## 2020-06-04 ENCOUNTER — Other Ambulatory Visit: Payer: Self-pay

## 2020-06-04 DIAGNOSIS — R2681 Unsteadiness on feet: Secondary | ICD-10-CM

## 2020-06-04 DIAGNOSIS — M6281 Muscle weakness (generalized): Secondary | ICD-10-CM | POA: Insufficient documentation

## 2020-06-04 DIAGNOSIS — R2689 Other abnormalities of gait and mobility: Secondary | ICD-10-CM | POA: Insufficient documentation

## 2020-06-04 DIAGNOSIS — R41841 Cognitive communication deficit: Secondary | ICD-10-CM | POA: Diagnosis present

## 2020-06-04 NOTE — Therapy (Signed)
[place] MAIN [facility] SERVICES [place], [place], [address] Phone: [phone]   Fax:  [phone]  Physical Therapy Evaluation  Patient Details  Name: [name] MRN: [id number] Date of Birth: [birth year] Referring Provider (PT): Meeler, Sherren Kerns FNP   Encounter Date: 06/04/2020    Past Medical History:  Diagnosis Date  . AAA (abdominal aortic aneurysm) ([place]) 05/2019   [place] Rapid progression from June-Aug 2020 (4.6 - 5.4 cm): s/p EVAR (Dr. Lucky Cowboy [facility]): EVAR - 23 mm prox, 12 cm distal & x 18 cm length Gore Excluder Endoprosthesis Main body (via RFA distal to lowest Renal A); 74m x 14 cm L Contralateral Limb for L Iliac - extended with 8 mm x 6cm LifeStar stent (post-dilated with 7 mm DEB).  Additional R Iliac PTA not required.   . Arthritis   . Basal cell carcinoma of eyelid 01/03/2013   2019 - R side of Nose  . Benign neoplasm of ascending colon   . Benign neoplasm of descending colon   . Bilateral iliac artery stenosis ([place] 2007   Bilateral Iliac A stenting; extension of EVAR limbs into both Iliacs -- additional stent placed in L Iliac.  . Cancer ([place]    Skin cancer  . Colon polyps   . Complication of anesthesia    severe confusion agitation requiring hospital admission x 4 days  . Compression fracture of fourth lumbar vertebra ([place] 04/2019   - s/p Kyphoplasty; T12, L3-5 ([facility]  . COPD (chronic obstructive pulmonary disease) (HCC)    Centrilobular Emphysema  . Coronary artery disease involving native coronary artery without angina pectoris 1998   - s/p CABG, occluded SVG-D1; patent LIMA-LAD, SVG-OM, SVG- RCA  . Dementia ([place]    memory loss - started noting late 2019  . Falls frequently    broke foot  . Fracture 2021   left foot  . GERD (gastroesophageal reflux disease)   . Hemorrhoid 02/10/2015  . History of hiatal hernia   . History of kidney stones   . HOH (hard of hearing)   . Hypertension   .  Lewy body dementia ([place] 04/04/2020  . Macular degeneration disease   . Osteoporosis   . PAF (paroxysmal atrial fibrillation) ([place]    on Eliquis for OAC - Rate Control   . Prostate cancer ([place]   . Prostate disorder   . Sciatic pain    Chronic  . Sleep apnea    cpap  . Temporary cerebral vascular dysfunction 02/10/2015   Had negative work-up.  July, 2012.  No medication changes, had forgot them that day, got dehydrated.   . Urinary incontinence     Past Surgical History:  Procedure Laterality Date  . ABDOMINAL AORTIC ENDOVASCULAR STENT GRAFT Bilateral 05/18/2019   Procedure: ABDOMINAL AORTIC ENDOVASCULAR STENT GRAFT;  Surgeon: DAlgernon Huxley MD;  Location: [facility] ORS;  Service: Vascular;  Laterality: Bilateral;  . ANKLE SURGERY Left    ORIF   . Arch aortogram and carotid aortogram  06/02/2006   Dr FOneida Alardid surgery  . BASAL CELL CARCINOMA EXCISION  04/2016   Dermatology  . CARDIAC CATHETERIZATION  01/30/1998    (Dr. HLinard Millers: Native LAD & mRCA CTO. 90% D1. Mod Cx. SVG-D1 CTO. SVG-RCA & SVG-OM along with LIMA-LAD patent;  LV NORMAL Fxn  . CAROTID ENDARTERECTOMY Left Oct. 29, 2007   Dr. FOneida Alar  . CATARACT EXTRACTION W/PHACO Right 03/05/2017   Procedure: CATARACT EXTRACTION PHACO AND INTRAOCULAR LENS PLACEMENT (  [place]);  Surgeon: Leandrew Koyanagi, MD;  Location: [facility] ORS;  Service: Ophthalmology;  Laterality: Right;  [place] 00:58.5 AP% 10.6 CDE 6.20 Fluid Pack lot # [id number]  . CATARACT EXTRACTION W/PHACO Left 04/15/2017   Procedure: CATARACT EXTRACTION PHACO AND INTRAOCULAR LENS PLACEMENT ([place])  Left;  Surgeon: Leandrew Koyanagi, MD;  Location: [place];  Service: Ophthalmology;  Laterality: Left;  . COLONOSCOPY WITH PROPOFOL N/A 09/15/2017   Procedure: COLONOSCOPY WITH PROPOFOL;  Surgeon: Lucilla Lame, MD;  Location: [facility] ENDOSCOPY;  Service: Endoscopy;  Laterality: N/A;  . COLONOSCOPY WITH PROPOFOL N/A 10/26/2018   Procedure: COLONOSCOPY WITH PROPOFOL;  Surgeon: Lucilla Lame, MD;  Location: [facility] ENDOSCOPY;  Service: Endoscopy;  Laterality: N/A;  . CORONARY ARTERY BYPASS GRAFT  1998   LIMA-LAD, SVG-OM, SVG-RPDA, SVG-DIAG  . CPET / MET  12/2012   Mild chronotropic incompetence - read 82% of predicted; also reduced effort; peak VO2 15.7 / 75% (did not reach Max effort) -- suggested ischemic response in last 1.5 minutes of exercise. Normal pulmonary function on PFTs but poor response to  . CYSTOSCOPY W/ RETROGRADES Left 01/13/2020   Procedure: CYSTOSCOPY WITH RETROGRADE PYELOGRAM;  Surgeon: Billey Co, MD;  Location: [facility] ORS;  Service: Urology;  Laterality: Left;  . CYSTOSCOPY WITH STENT PLACEMENT Left 12/26/2019   Procedure: CYSTOSCOPY WITH STENT PLACEMENT;  Surgeon: Billey Co, MD;  Location: [facility] ORS;  Service: Urology;  Laterality: Left;  . CYSTOSCOPY/URETEROSCOPY/HOLMIUM LASER/STENT PLACEMENT Left 01/13/2020   Procedure: CYSTOSCOPY/URETEROSCOPY/HOLMIUM LASER/STENT EXCHANGE;  Surgeon: Billey Co, MD;  Location: [facility] ORS;  Service: Urology;  Laterality: Left;  . ENDOVASCULAR REPAIR/STENT GRAFT N/A 05/18/2019   Procedure: ENDOVASCULAR REPAIR/STENT GRAFT;  Surgeon: Algernon Huxley, MD;  Location: [facility] INVASIVE CV LAB;; EVAR - 23 mm prox, 12 cm distal & x 18 cm length Gore Excluder Endoprosthesis Main body (via RFA distal to lowest Renal A); 69m x 14 cm L Contralateral Limb for L Iliac - extended with 8 mm x 6cm LifeStar stent; no additional R Iliac PTA needed.   . ESOPHAGOGASTRODUODENOSCOPY (EGD) WITH PROPOFOL N/A 09/15/2017   Procedure: ESOPHAGOGASTRODUODENOSCOPY (EGD) WITH PROPOFOL;  Surgeon: WLucilla Lame MD;  [facility]  Service: Endoscopy;  Laterality: N/A;  . FRACTURE SURGERY Right 03/19/2015   wrist  . FRACTURE SURGERY Left   . HIATAL HERNIA REPAIR    . ILIAC ARTERY STENT  12/15/2005   PTA and direct stenting rgt and lft common iliac arteries  . KYPHOPLASTY N/A 04/05/2019   Procedure: KYPHOPLASTY T12, L3, L4 L5;  Surgeon: MHessie Knows MD;  Location: [facility] ORS;  Service: Orthopedics;  Laterality: N/A;  . NM GATED MYOVIEW ([place])  05/'19; 8/'20   a) [facility]: Low Risk - no ischemia or infarct.  EF > 65%. no RWMA;; b) ([facility]: Normal wall motion.  No ischemia or infarction.  . OPEN REDUCTION INTERNAL FIXATION (ORIF) DISTAL RADIAL FRACTURE Left 01/13/2019   Procedure: OPEN REDUCTION INTERNAL FIXATION (ORIF) DISTAL  RADIAL FRACTURE - LEFT - SLEEP APNEA;  Surgeon: MHessie Knows MD;  Location: [facility] ORS;  Service: Orthopedics;  Laterality: Left;  . ORIF ELBOW FRACTURE Right 01/01/2017   Procedure: OPEN REDUCTION INTERNAL FIXATION (ORIF) ELBOW/OLECRANON FRACTURE;  Surgeon: MHessie Knows MD;  Location: [facility] ORS;  Service: Orthopedics;  Laterality: Right;  . ORIF WRIST FRACTURE Right 03/19/2015   Procedure: OPEN REDUCTION INTERNAL FIXATION (ORIF) WRIST FRACTURE;  Surgeon: MHessie Knows MD;  Location: [facility] ORS;  Service: Orthopedics;  Laterality: Right;  . SHOULDER ARTHROSCOPY  Right   . THORACIC AORTA - CAROTID ANGIOGRAM  October 2007   Dr. 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 TRANSTHORACIC ECHOCARDIOGRAM  February 2016   [facility]: Normal LV function. Dilated left atrium.  Domingo Dimes ECHOCARDIOGRAM  04/2019   [facility] April 20, 2019): EF 55-60 %.  Mild LVH.  Relatively normal valves.    There were no vitals filed for this visit.       Neurological and Other Medical Information: What were your initial symptoms of Parkinson disease? N/a has diagnosis of Lewy Body Dementia Do you have tremors?  Yes_x__ No____  If yes, please describe: L arm, decreased recently but is occasionally present.  Do you have any other medical problem? Yes _x____ No ____ If yes, please describe: dementia, back pain, sleep disorders, visual field lacking in R eye (central vision loss, peripheral present)  Do you have any pain? Yes _x___ No _____  If yes, please describe: occasional back pain Pain rating (on scale of 1-10 with 10 being most severe): none at this time  Medical Information:  Medication for Parkinson disease: n/a Do you experience on/off symptoms? Yes _x___ No_____ If yes, please describe: worse in evenings Do you experience any dyskinesias? Yes __x___ No _____ If yes, please describe: partially due to visual field loss Motor Symptoms:  When did you first start to notice changes in your movement you associate with Parkinson disease? 8-9 months ago What are your current symptoms? Weakness, balance, shuffling, difficulty getting up from chair, cutting meat/food What is your most significant problem related to movement today?taking a shower, getting dressed, walking, getting arm through holes in shirts What do you do when you want to move the best you possibly can? Focus on task, take away distractions Has Parkinson disease caused you to move less or be less active? Yes _x___ No____ If yes, how much less? Balance causes patient to require increased use of an aide or helper to hold onto his gait belt Why has Parkinson disease caused you to move less? Less steady now Have you noticed if your movement is slower than it used to be? For example, walking, getting dressed, doing household chores, bathing, etc.  Yes __x_ No____ If yes please describe: needs more time and help  Have you or others noticed any changes in your posture? Yes _x___ No____ If yes, please describe: leans forward How many (if any) falls have you had in the last year? At least 6 -The last 3 months? 1 at the doctor -The last month? 1 -The last week? 0 -What factors contributed to those falls? They are random   Have you noticed changes in your stamina? Yes _x__ No_____  If yes, please describe: unable to tolerate walking as long Does your body feel fatigued at the end of the day? Yes _x___ No _____ If yes, please describe: more tired by end of day  Have you  noticed any freezing with your movement? Yes _x___ No____ If yes, when do you notice freezing? Entering/exiting room  Are there some activities you now need help with because of your Parkinson disease? For example, getting socks or shoes on, buttoning, getting up from low chairs, walking on uneven ground, etc.  Yes: dressing, bathing, walking Have you noticed any changes in the functioning of your hands? Yes _x___ No_____ ;   Have you noticed any changes in your ability to: -Button: -Dial the phone: yes -Open  containers:yes -Manipulate money: -Tie shoes: -Write: yes -Type or use a computer: -Other:   Movement Situations:  If you had one situation in which you wanted to move well, what would it be? Get up from the chair Describe your day in terms of mobility or movement activity/situations (who is present, how do you move, when does it occur, with what devices). Have to have help getting dressed in the morning, an aide helps hold patient as he ambulates and transfers within home.  When do you find it most difficult to move? Later in the day  What would you like to improve about your ability to move? Be safer and steadier Why have you given up these activities? Because of problems with Moving, Speaking, Motivation? Difficulty moving and walking  Assessment:  Sit to stand assessment:  5x STS: 25 seconds with BUE support Comments: heavy bilateral UE support required and task orientation  Gait assessments:  TUG; 17 seconds Comments:with rollator requires cueing for task orientation TUG manual  Comments: unable to perform due to use of rollator  TUG cognitive: 29.19 when attempting correct cognitive task (comple), 23.14 when counting up by 1.  Comments: very challenged with dual task, becomes frustrated and upset  Balance Assessments: Moderate-severe balance: BERG: 35/ 56   Other test: Foto: 47.3 %; Discharge predicted score: 52%  Strength:   Right Left  Hip flexion 4/5 4/5  Hip  Abduction 4-/5 4-/5  Hip Adduction 4-/5 4-/5  Knee Extension  4-/5 4/5  Knee Flexion 4-/5 4-/5  DF 4-/5 4-/5  PF 4-/5 4/5   Functional task recording form completed by significant other: 1. Eating: feeding self cutting food: moderate difficulty 2. Shower: unable 3. walking without assistance: very difficult 4. Bathroom (cleaning up self): minimal difficult 5. Dressing self: minimal difficult 6. Fixing sandwich or snack: moderate difficulty 7. Opening mail or packaging: very difficult 8. Pouring a drink: very difficult     Functional Component movements: 1. Sit to stand 2. Walk into/out of room without freezing 3. Getting things into/out of freezer 4. Walking longer distances 5. Putting arm through armhole in shirt/leg through leg hole  Hierarchies: 1. Negotiating small spaces in his home including kitchen,living room, bathroom and walk in closets without hesitation, freezing or balance issues.  2. Toileting independently without losing balance when transitioning from sit to stand, able to self clean without loss of balance.  3. Patient will be able to dress self without assistance indicating improved balance with reaching, stepping, and stabilizing self as well as self awareness of positioning.    [facility] PT Assessment - 06/04/20 0001      Assessment   Medical Diagnosis LSVT    Referring Provider (PT) Meeler, Whitney L FNP    Onset Date/Surgical Date --   ~8 months ago   Hand Dominance Right    Prior Therapy yes but not LSVT      Precautions   Precautions Fall      Restrictions   Weight Bearing Restrictions No      Balance Screen   Has the patient fallen in the past 6 months Yes    How many times? 2    Has the patient had a decrease in activity level because of a fear of falling?  Yes (P)     Is the patient reluctant to leave their home because of a fear of falling?  Yes (P)       Home Environment   Living Environment Private residence (P)  Living Arrangements  Alone (P)     Available Help at Discharge Personal care attendant;Available PRN/intermittently;Home health (P)     Type of Home House (P)     Home Access Ramped entrance (P)     Home Layout One level (P)     [place] - single point;Grab bars - tub/shower;Grab bars - toilet;Walker - 2 wheels (P)       Prior Function   Level of Independence Independent with household mobility with device (P)     Vocation Retired (P)       Observation/Other Assessments   Observations forward head rounded shoulders positioning seated. excessive trunk flexion in standing.  (P)     Focus on Therapeutic Outcomes (FOTO)  47.3% (P)       Sensation   Light Touch Impaired by gross assessment (P)       Coordination   Gross Motor Movements are Fluid and Coordinated No (P)       Transfers   Transfers Sit to Stand (P)     Sit to Stand 5: Supervision;With upper extremity assist;With armrests (P)       Ambulation/Gait   Ambulation/Gait Yes (P)     Ambulation/Gait Assistance 5: Supervision (P)       Standardized Balance Assessment   Standardized Balance Assessment Berg Balance Test (P)       Berg Balance Test   Sit to Stand Able to stand  independently using hands (P)     Standing Unsupported Able to stand 2 minutes with supervision (P)     Sitting with Back Unsupported but Feet Supported on Floor or Stool Able to sit safely and securely 2 minutes (P)     Stand to Sit Controls descent by using hands (P)     Transfers Able to transfer safely, definite need of hands (P)     Standing Unsupported with Eyes Closed Able to stand 10 seconds with supervision (P)     Standing Unsupported with Feet Together Able to place feet together independently but unable to hold for 30 seconds (P)     From Standing, Reach Forward with Outstretched Arm Can reach forward >5 cm safely (2") (P)     From Standing Position, Pick up Object from Floor Able to pick up shoe, needs supervision (P)     From Standing Position, Turn  to Look Behind Over each Shoulder Looks behind one side only/other side shows less weight shift (P)     Turn 360 Degrees Able to turn 360 degrees safely but slowly (P)     Standing Unsupported, Alternately Place Feet on Step/Stool Able to complete 4 steps without aid or supervision (P)     Standing Unsupported, One Foot in Front Able to take small step independently and hold 30 seconds (P)     Standing on One Leg Unable to try or needs assist to prevent fall (P)     Total Score 35 (P)                Objective measurements completed on examination: See above findings.                 PT Short Term Goals - 05/25/20 7893      PT SHORT TERM GOAL #1   Title Patient will be independent in home exercise program to improve strength/mobility for better functional independence with ADLs.    Baseline 6/29: HEP given 8/17: HEP compliant with son 8/24: compliant with son  Time 4    Period Weeks    Status Achieved    Target Date 03/27/20      PT SHORT TERM GOAL #2   Title Patient will perform 5 sit to stand transfers without UE support to increase functional mobility for carryover to community activities.    Baseline 6/29; only able to perform 2 8/17: able to perform 4 9/24 : able to perform 5    Time 4    Period Weeks    Status Achieved    Target Date 05/22/20             PT Long Term Goals - 05/25/20 0814      PT LONG TERM GOAL #1   Title Patient will increase FOTO score to equal to or greater than 54% to demonstrate statistically significant improvement in mobility and quality of life.    Baseline 6/29: 54% 8/17: 43.6% 9/24: 45.8%    Time 8    Period Weeks    Status Partially Met    Target Date 06/19/20      PT LONG TERM GOAL #2   Title Patient will demonstrate an improved Berg Balance Score of > 36 as to demonstrate improved balance with ADLs such as sitting/standing and transfer balance and reduced fall risk.    Baseline 6/29: 30/56 8/17: 37/56    Time 8     Period Weeks    Status Achieved      PT LONG TERM GOAL #3   Title Patient will increase 10 meter walk test to >1.60ms as to improve gait speed for better community ambulation and to reduce fall risk.    Baseline 6/29: 0.544m with RW 8/17: >1.0 with rollator    Time 8    Period Weeks    Status Achieved      PT LONG TERM GOAL #4   Title Patient will increase BLE gross strength to 4+/5 as to improve functional strength for independent gait, increased standing tolerance and increased ADL ability.    Baseline 6/29: see note 8/17: see note    Time 8    Period Weeks    Status Partially Met    Target Date 06/19/20      PT LONG TERM GOAL #5   Title Patient will be able to perform household work/ chores without increase in symptoms.    Baseline 6/29: unable to perform 8/17: limited by balance and cognition as well as pain 9/24: cognition and balance limit not pain anymore    Time 8    Period Weeks    Status Achieved      PT LONG TERM GOAL #6   Title Patient will increase six minute walk test distance to >1000 for progression to community ambulator and improve gait ability    Baseline 9/24: 882 ft with rollator    Time 8    Period Weeks    Status New    Target Date 06/19/20                   Patient will benefit from skilled therapeutic intervention in order to improve the following deficits and impairments:     Visit Diagnosis: No diagnosis found.     Problem List Patient Active Problem List   Diagnosis Date Noted  . LBD (Lewy body dementia) (H[place]09/13/2021  . Hematuria   . Acute kidney injury superimposed on CKD (HCS[place]  . Sepsis due to gram-negative UTI (HCP[place]ham Manor04/26/2021  . Left nephrolithiasis 12/26/2019  .  Hydronephrosis, left 12/26/2019  . Status post abdominal aortic aneurysm (AAA) repair 04/15/2019  . Malignant neoplasm of descending colon ([place])   . Do not intubate, cardiopulmonary resuscitation (CPR)-only code status 10/08/2018  . Prostate cancer ([place])  02/25/2018  . Prostatic cyst 01/15/2018  . 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 ([place]) 09/15/2017  . Blood in stool   . Abnormal feces   . Stricture and stenosis of esophagus   . Mild cognitive impairment 08/18/2017  . Senile purpura ([place]) 04/29/2017  . Anemia 04/29/2017  . Frequent falls 04/29/2017  . Simple chronic bronchitis ([place]) 12/17/2016  . Benign localized hyperplasia of prostate with urinary obstruction 07/15/2016  . Elevated PSA 07/15/2016  . Incomplete emptying of bladder 07/15/2016  . Urge incontinence 07/15/2016  . Hypothyroidism 04/25/2016  . Macular degeneration 04/25/2016  . Erectile dysfunction due to arterial insufficiency   . Ischemic heart disease with chronotropic incompetence   . CN (constipation) 02/10/2015  . DD (diverticular disease) 02/10/2015  . Weakness 02/10/2015  . Lichen planus 38/45/3646  . Restless leg 02/10/2015  . 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 B12 deficiency 02/10/2015  . COPD (chronic obstructive pulmonary disease) ([place]) 11/14/2014  . Post Inflammatory Lung Changes 11/14/2014  . Acute delirium 11/08/2014  . PAF (paroxysmal atrial fibrillation) (HCC) CHA2DS2-VASc = 4. AC = Eliquis   . Insomnia 12/09/2013  . OSA on CPAP   . Dyspnea on exertion - -essentially resolved with BB dose reduction & wgt loss 03/29/2013  . Overweight (BMI 25.0-29.9) -- Wgt back up 01/17/2013  . Atherosclerotic heart disease of native coronary artery without angina pectoris - s/p CABG, occluded SVG-D1; patent LIMA-LAD, SVG-OM, SVG- RCA   . Essential hypertension   . Hyperlipidemia with target LDL less than 70   . Aorto-iliac disease ([place])   . BCC (basal cell carcinoma), eyelid 01/03/2013  . Allergic rhinitis 08/17/2009  . Adaptation reaction 05/28/2009  . Acid reflux 05/28/2009  . Arthritis, degenerative 05/28/2009  . 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 Carotid artery occlusion without infarction 06/01/2006  . PAD (peripheral artery disease) -  bilateral common iliac stents 12/15/2005  . Atherosclerotic heart disease of artery bypass graft 09/01/1997   Janna Arch, PT, DPT   06/04/2020, 2:26 PM  [place] MAIN [facility] SERVICES [address] [place], [place], [address] Phone: [phone]   Fax:  [phone]  Name: [name] MRN: [id number] Date of Birth: 1934/07/29

## 2020-06-05 ENCOUNTER — Other Ambulatory Visit: Payer: Self-pay

## 2020-06-05 ENCOUNTER — Ambulatory Visit: Payer: Medicare Other | Admitting: Speech Pathology

## 2020-06-05 ENCOUNTER — Encounter: Payer: Self-pay | Admitting: Speech Pathology

## 2020-06-05 ENCOUNTER — Other Ambulatory Visit: Payer: Self-pay | Admitting: Family Medicine

## 2020-06-05 ENCOUNTER — Ambulatory Visit: Payer: Medicare Other

## 2020-06-05 DIAGNOSIS — I10 Essential (primary) hypertension: Secondary | ICD-10-CM

## 2020-06-05 DIAGNOSIS — R2681 Unsteadiness on feet: Secondary | ICD-10-CM

## 2020-06-05 DIAGNOSIS — M6281 Muscle weakness (generalized): Secondary | ICD-10-CM

## 2020-06-05 DIAGNOSIS — R2689 Other abnormalities of gait and mobility: Secondary | ICD-10-CM

## 2020-06-05 NOTE — Therapy (Signed)
[place] MAIN [facility] SERVICES [address] [place], [place], [address] Phone: [phone]   Fax:  [phone]  Speech Language Pathology Evaluation  Patient Details  Name: [name] MRN: [id number] Date of Birth: 1933-11-16 Referring Provider (SLP): Vertell Novak Date: 06/04/2020   End of Session - 06/05/20 1538    Visit Number 1    Number of Visits 1    Authorization Type Medicare    SLP Start Time 1110    SLP Stop Time  1145    SLP Time Calculation (min) 35 min    Activity Tolerance Patient tolerated treatment well           Past Medical History:  Diagnosis Date  . AAA (abdominal aortic aneurysm) ([place]) 05/2019   [place]ativley Rapid progression from June-Aug 2020 (4.6 - 5.4 cm): s/p EVAR (Dr. [facility]): EVAR - 23 mm prox, 12 cm distal & x 18 cm length Gore Excluder Endoprosthesis Main body (via RFA distal to lowest Renal A); 19m x 14 cm L Contralateral Limb for L Iliac - extended with 8 mm x 6cm LifeStar stent (post-dilated with 7 mm DEB).  Additional R Iliac PTA not required.   . Arthritis   . Basal cell carcinoma of eyelid 01/03/2013   2019 - R side of Nose  . Benign neoplasm of ascending colon   . Benign neoplasm of descending colon   . Bilateral iliac artery stenosis ([place] 2007   Bilateral Iliac A stenting; extension of EVAR limbs into both Iliacs -- additional stent placed in L Iliac.  . Cancer ([place]    Skin cancer  . Colon polyps   . Complication of anesthesia    severe confusion agitation requiring hospital admission x 4 days  . Compression fracture of fourth lumbar vertebra ([place] 04/2019   - s/p Kyphoplasty; T12, L3-5 ([facility]  . COPD (chronic obstructive pulmonary disease) (HCC)    Centrilobular Emphysema  . Coronary artery disease involving native coronary artery without angina pectoris 1998   - s/p CABG, occluded SVG-D1; patent LIMA-LAD, SVG-OM, SVG- RCA  . Dementia ([facility]    memory loss - started noting  late 2019  . Falls frequently    broke foot  . Fracture 2021   left foot  . GERD (gastroesophageal reflux disease)   . Hemorrhoid 02/10/2015  . History of hiatal hernia   . History of kidney stones   . HOH (hard of hearing)   . Hypertension   . Lewy body dementia ([facility] 04/04/2020  . Macular degeneration disease   . Osteoporosis   . PAF (paroxysmal atrial fibrillation) ([facility]    on Eliquis for OAC - Rate Control   . Prostate cancer ([facility]   . Prostate disorder   . Sciatic pain    Chronic  . Sleep apnea    cpap  . Temporary cerebral vascular dysfunction 02/10/2015   Had negative work-up.  July, 2012.  No medication changes, had forgot them that day, got dehydrated.   . Urinary incontinence     Past Surgical History:  Procedure Laterality Date  . ABDOMINAL AORTIC ENDOVASCULAR STENT GRAFT Bilateral 05/18/2019   Procedure: ABDOMINAL AORTIC ENDOVASCULAR STENT GRAFT;  Surgeon: DAlgernon Huxley MD;  Location: [facility] ORS;  Service: Vascular;  Laterality: Bilateral;  . ANKLE SURGERY Left    ORIF   . Arch aortogram and carotid aortogram  06/02/2006   Dr FOneida Alardid surgery  . BASAL CELL CARCINOMA EXCISION  04/2016  Dermatology  . CARDIAC CATHETERIZATION  01/30/1998    (Dr. Linard Millers): Native LAD & mRCA CTO. 90% D1. Mod Cx. SVG-D1 CTO. SVG-RCA & SVG-OM along with LIMA-LAD patent;  LV NORMAL Fxn  . CAROTID ENDARTERECTOMY Left Oct. 29, 2007   Dr. Oneida Alar:  . CATARACT EXTRACTION W/PHACO Right 03/05/2017   Procedure: CATARACT EXTRACTION PHACO AND INTRAOCULAR LENS PLACEMENT (IOC);  Surgeon: Leandrew Koyanagi, MD;  Location: [facility] ORS;  Service: Ophthalmology;  Laterality: Right;  [place] 00:58.5 AP% 10.6 CDE 6.20 Fluid Pack lot # [id number]  . CATARACT EXTRACTION W/PHACO Left 04/15/2017   Procedure: CATARACT EXTRACTION PHACO AND INTRAOCULAR LENS PLACEMENT ([place])  Left;  Surgeon: Leandrew Koyanagi, MD;  Location: [place];  Service: Ophthalmology;  Laterality: Left;  . COLONOSCOPY WITH  PROPOFOL N/A 09/15/2017   Procedure: COLONOSCOPY WITH PROPOFOL;  Surgeon: Lucilla Lame, MD;  Location[facility] ENDOSCOPY;  Service: Endoscopy;  Laterality: N/A;  . COLONOSCOPY WITH PROPOFOL N/A 10/26/2018   Procedure: COLONOSCOPY WITH PROPOFOL;  Surgeon: Lucilla Lame, MD;  [facility]COPY;  Service: Endoscopy;  Laterality: N/A;  . CORONARY ARTERY BYPASS GRAFT  1998   LIMA-LAD, SVG-OM, SVG-RPDA, SVG-DIAG  . CPET / MET  12/2012   Mild chronotropic incompetence - read 82% of predicted; also reduced effort; peak VO2 15.7 / 75% (did not reach Max effort) -- suggested ischemic response in last 1.5 minutes of exercise. Normal pulmonary function on PFTs but poor response to  . CYSTOSCOPY W/ RETROGRADES Left 01/13/2020   Procedure: CYSTOSCOPY WITH RETROGRADE PYELOGRAM;  Surgeon: Billey Co, MD;  Location: [facility] ORS;  Service: Urology;  Laterality: Left;  . CYSTOSCOPY WITH STENT PLACEMENT Left 12/26/2019   Procedure: CYSTOSCOPY WITH STENT PLACEMENT;  Surgeon: Billey Co, MD;  Location: [facility] ORS;  Service: Urology;  Laterality: Left;  . CYSTOSCOPY/URETEROSCOPY/HOLMIUM LASER/STENT PLACEMENT Left 01/13/2020   Procedure: CYSTOSCOPY/URETEROSCOPY/HOLMIUM LASER/STENT EXCHANGE;  Surgeon: Billey Co, MD;  Location: [facility] ORS;  Service: Urology;  Laterality: Left;  . ENDOVASCULAR REPAIR/STENT GRAFT N/A 05/18/2019   Procedure: ENDOVASCULAR REPAIR/STENT GRAFT;  Surgeon: Algernon Huxley, MD;  Location: [facility] INVASIVE CV LAB;; EVAR - 23 mm prox, 12 cm distal & x 18 cm length Gore Excluder Endoprosthesis Main body (via RFA distal to lowest Renal A); 37m x 14 cm L Contralateral Limb for L Iliac - extended with 8 mm x 6cm LifeStar stent; no additional R Iliac PTA needed.   . ESOPHAGOGASTRODUODENOSCOPY (EGD) WITH PROPOFOL N/A 09/15/2017   Procedure: ESOPHAGOGASTRODUODENOSCOPY (EGD) WITH PROPOFOL;  Surgeon: WLucilla Lame MD;  [facility];  Service: Endoscopy;  Laterality: N/A;  . FRACTURE SURGERY Right  03/19/2015   wrist  . FRACTURE SURGERY Left   . HIATAL HERNIA REPAIR    . ILIAC ARTERY STENT  12/15/2005   PTA and direct stenting rgt and lft common iliac arteries  . KYPHOPLASTY N/A 04/05/2019   Procedure: KYPHOPLASTY T12, L3, L4 L5;  Surgeon: MHessie Knows MD;  Location: [facility] ORS;  Service: Orthopedics;  Laterality: N/A;  . NM GATED MYOVIEW ([place])  05/'19; 8/'20   a) [facility]: Low Risk - no ischemia or infarct.  EF > 65%. no RWMA;; b) ([facility]: Normal wall motion.  No ischemia or infarction.  . OPEN REDUCTION INTERNAL FIXATION (ORIF) DISTAL RADIAL FRACTURE Left 01/13/2019   Procedure: OPEN REDUCTION INTERNAL FIXATION (ORIF) DISTAL  RADIAL FRACTURE - LEFT - SLEEP APNEA;  Surgeon: MHessie Knows MD;  Location: [facility] ORS;  Service: Orthopedics;  Laterality: Left;  . ORIF ELBOW  FRACTURE Right 01/01/2017   Procedure: OPEN REDUCTION INTERNAL FIXATION (ORIF) ELBOW/OLECRANON FRACTURE;  Surgeon: Kennedy Bucker, MD;  Location: [facility] ORS;  Service: Orthopedics;  Laterality: Right;  . ORIF WRIST FRACTURE Right 03/19/2015   Procedure: OPEN REDUCTION INTERNAL FIXATION (ORIF) WRIST FRACTURE;  Surgeon: Kennedy Bucker, MD;  Location: [facility] ORS;  Service: Orthopedics;  Laterality: Right;  . SHOULDER ARTHROSCOPY Right   . THORACIC AORTA - CAROTID ANGIOGRAM  October 2007   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 TRANSTHORACIC ECHOCARDIOGRAM  February 2016   [facility]: Normal LV function. Dilated left atrium.  Krystal Clark ECHOCARDIOGRAM  04/2019   [facility] April 20, 2019): EF 55-60 %.  Mild LVH.  Relatively normal valves.    There were no vitals filed for this visit.   Subjective Assessment - 06/05/20 1530    Subjective pt pleasant    Patient is accompained by: Family member    Currently in Pain? No/denies              SLP Evaluation [facility] - 06/05/20 1530      SLP Visit Information   SLP Received On  06/04/20    Referring Provider (SLP) Sherryll Burger    Onset Date 04/04/2020    Medical Diagnosis Dementia      Subjective   Subjective pt pleasant, accompanied by significant friend Vernona Rieger)    Patient/Family Stated Goal increase physical ability      General Information   HPI Juanangel Soderholm is an 86 y/o patient who presents as a potential candidate for LSVT LOUD. Pt referred by Dr Sherryll Burger on 04/04/2020. PMH - Lewy Body Dementia, AAA, arthritis, atherosclerotic heart disease (s/p CABG), benign neoplasm of ascending/descending colon, COPD, CAD, dementia, dysrhythmia, falls, GERD, hemorrhoid, HOH, HTN, PAF, prostate cancer, osteoporosis, kidney stones, macular degeneration,     Behavioral/Cognition hard of hearing, decreased recall    Mobility Status ambulatory      Prior Functional Status   Cognitive/Linguistic Baseline Baseline deficits    Baseline deficit details Total assistance provided for all cognitive tasks    Type of Home House     Lives With --   Has 24 hour care      Cognition   Overall Cognitive Status History of cognitive impairments - at baseline      Expression   Primary Mode of Expression Verbal      Verbal Expression   Overall Verbal Expression Appears within functional limits for tasks assessed      Oral Motor/Sensory Function   Overall Oral Motor/Sensory Function Appears within functional limits for tasks assessed      Motor Speech   Overall Motor Speech Appears within functional limits for tasks assessed                SLP Education - 06/05/20 1538    Education Details provided    [employer]) Educated Patient;Other (comment)   significant other   Methods Explanation    Comprehension Verbalized understanding                Plan - 06/05/20 1543    Clinical Impression Statement Pt is accompanted by his significant other Vernona Rieger) who provides historical information. Pt currently has 24 hour care between paid caregivers, his children and Vernona Rieger. They provide  assistance for all cognitive tasks. Pt is able to clearly communicate his wants/needs/pain with appropriate speech intelligibility and language use. At the time of pt's initial referral  on 04/04/2020 pt's speech was occasionally mumbled, thus the request for LSVT LOUD. In the time since, pt's speech has cleared completely as well as some other physical symptoms. Pt is not on any Parkinson's medicines. At this time, pt's cognitive abilities are at baseline and are well copmensated for by current level of support. Pt's speech intelligibility and prosody is not impaired. At this time, [name], pt and SLP are in agree that skilled ST intervention is not indicated.    Consulted and Agree with Plan of Care Patient;Family member/caregiver    Family Member Consulted [name]           Problem List Patient Active Problem List   Diagnosis Date Noted  . LBD (Lewy body dementia) ([place]) 05/14/2020  . Hematuria   . Acute kidney injury superimposed on CKD ([place])   . Sepsis due to gram-negative UTI ([place]) 12/26/2019  . Left nephrolithiasis 12/26/2019  . Hydronephrosis, left 12/26/2019  . Status post abdominal aortic aneurysm (AAA) repair 04/15/2019  . Malignant neoplasm of descending colon ([place])   . Do not intubate, cardiopulmonary resuscitation (CPR)-only code status 10/08/2018  . Prostate cancer ([place]) 02/25/2018  . Prostatic cyst 01/15/2018  . 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 ([place]) 09/15/2017  . Blood in stool   . Abnormal feces   . Stricture and stenosis of esophagus   . Mild cognitive impairment 08/18/2017  . Senile purpura ([place]) 04/29/2017  . Anemia 04/29/2017  . Frequent falls 04/29/2017  . Simple chronic bronchitis ([place]) 12/17/2016  . Benign localized hyperplasia of prostate with urinary obstruction 07/15/2016  . Elevated PSA 07/15/2016  . Incomplete emptying of bladder 07/15/2016  . Urge incontinence 07/15/2016  . Hypothyroidism 04/25/2016  . Macular degeneration 04/25/2016  . Erectile  dysfunction due to arterial insufficiency   . Ischemic heart disease with chronotropic incompetence   . CN (constipation) 02/10/2015  . DD (diverticular disease) 02/10/2015  . Weakness 02/10/2015  . Lichen planus 71/24/5809  . Restless leg 02/10/2015  . 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 B12 deficiency 02/10/2015  . COPD (chronic obstructive pulmonary disease) ([place]) 11/14/2014  . Post Inflammatory Lung Changes 11/14/2014  . Acute delirium 11/08/2014  . PAF (paroxysmal atrial fibrillation) (HCC) CHA2DS2-VASc = 4. AC = Eliquis   . Insomnia 12/09/2013  . OSA on CPAP   . Dyspnea on exertion - -essentially resolved with BB dose reduction & wgt loss 03/29/2013  . Overweight (BMI 25.0-29.9) -- Wgt back up 01/17/2013  . Atherosclerotic heart disease of native coronary artery without angina pectoris - s/p CABG, occluded SVG-D1; patent LIMA-LAD, SVG-OM, SVG- RCA   . Essential hypertension   . Hyperlipidemia with target LDL less than 70   . Aorto-iliac disease ([place])   . BCC (basal cell carcinoma), eyelid 01/03/2013  . Allergic rhinitis 08/17/2009  . Adaptation reaction 05/28/2009  . Acid reflux 05/28/2009  . Arthritis, degenerative 05/28/2009  . 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 Carotid artery occlusion without infarction 06/01/2006  . PAD (peripheral artery disease) - bilateral common iliac stents 12/15/2005  . Atherosclerotic heart disease of artery bypass graft 09/01/1997   Trinady Milewski B. Rutherford Nail M.S., CCC-SLP, [place] Pathologist Rehabilitation Services Office [phone]  Stormy Fabian 06/05/2020, 3:51 PM  [place] MAIN [facility] SERVICES [address] [place], [place], [address] Phone: [phone]   Fax:  [phone]  Name: JASAN DOUGHTIE MRN: [id number] Date of Birth: August 09, 1934

## 2020-06-05 NOTE — Therapy (Signed)
[place] MAIN [facility] SERVICES [address] [place], [place], [address] Phone: [phone]   Fax:  [phone]  Physical Therapy Treatment  Patient Details  Name: [name] MRN: [id number] Date of Birth: 1934/01/09 Referring Provider (PT): Meeler, Sherren Kerns FNP   Encounter Date: 06/05/2020   PT End of Session - 06/05/20 1407    Visit Number 2    Number of Visits 17    Date for PT Re-Evaluation 07/02/20    Authorization Type LSVT eval 2/10 06/04/20    PT Start Time 1100    PT Stop Time 1159    PT Time Calculation (min) 59 min    Equipment Utilized During Treatment Gait belt    Activity Tolerance Patient tolerated treatment well;Patient limited by fatigue    Behavior During Therapy WFL for tasks assessed/performed           Past Medical History:  Diagnosis Date  . AAA (abdominal aortic aneurysm) ([place]) 05/2019   [place]ativley Rapid progression from June-Aug 2020 (4.6 - 5.4 cm): s/p EVAR (Dr. Lucky Cowboy [facility]): EVAR - 23 mm prox, 12 cm distal & x 18 cm length Gore Excluder Endoprosthesis Main body (via RFA distal to lowest Renal A); 73m x 14 cm L Contralateral Limb for L Iliac - extended with 8 mm x 6cm LifeStar stent (post-dilated with 7 mm DEB).  Additional R Iliac PTA not required.   . Arthritis   . Basal cell carcinoma of eyelid 01/03/2013   2019 - R side of Nose  . Benign neoplasm of ascending colon   . Benign neoplasm of descending colon   . Bilateral iliac artery stenosis ([place] 2007   Bilateral Iliac A stenting; extension of EVAR limbs into both Iliacs -- additional stent placed in L Iliac.  . Cancer ([place]    Skin cancer  . Colon polyps   . Complication of anesthesia    severe confusion agitation requiring hospital admission x 4 days  . Compression fracture of fourth lumbar vertebra ([place] 04/2019   - s/p Kyphoplasty; T12, L3-5 ([facility]  . COPD (chronic obstructive pulmonary disease) (HCC)    Centrilobular Emphysema  . Coronary  artery disease involving native coronary artery without angina pectoris 1998   - s/p CABG, occluded SVG-D1; patent LIMA-LAD, SVG-OM, SVG- RCA  . Dementia ([place]    memory loss - started noting late 2019  . Falls frequently    broke foot  . Fracture 2021   left foot  . GERD (gastroesophageal reflux disease)   . Hemorrhoid 02/10/2015  . History of hiatal hernia   . History of kidney stones   . HOH (hard of hearing)   . Hypertension   . Lewy body dementia ([place] 04/04/2020  . Macular degeneration disease   . Osteoporosis   . PAF (paroxysmal atrial fibrillation) ([place]    on Eliquis for OAC - Rate Control   . Prostate cancer ([place]   . Prostate disorder   . Sciatic pain    Chronic  . Sleep apnea    cpap  . Temporary cerebral vascular dysfunction 02/10/2015   Had negative work-up.  July, 2012.  No medication changes, had forgot them that day, got dehydrated.   . Urinary incontinence     Past Surgical History:  Procedure Laterality Date  . ABDOMINAL AORTIC ENDOVASCULAR STENT GRAFT Bilateral 05/18/2019   Procedure: ABDOMINAL AORTIC ENDOVASCULAR STENT GRAFT;  Surgeon: DAlgernon Huxley MD;  Location: [facility] ORS;  Service: Vascular;  Laterality:  Bilateral;  . ANKLE SURGERY Left    ORIF   . Arch aortogram and carotid aortogram  06/02/2006   Dr Oneida Alar did surgery  . BASAL CELL CARCINOMA EXCISION  04/2016   Dermatology  . CARDIAC CATHETERIZATION  01/30/1998    (Dr. Linard Millers): Native LAD & mRCA CTO. 90% D1. Mod Cx. SVG-D1 CTO. SVG-RCA & SVG-OM along with LIMA-LAD patent;  LV NORMAL Fxn  . CAROTID ENDARTERECTOMY Left Oct. 29, 2007   Dr. Oneida Alar:  . CATARACT EXTRACTION W/PHACO Right 03/05/2017   Procedure: CATARACT EXTRACTION PHACO AND INTRAOCULAR LENS PLACEMENT (IOC);  Surgeon: Leandrew Koyanagi, MD;  Location: [facility] ORS;  Service: Ophthalmology;  Laterality: Right;  [place] 00:58.5 AP% 10.6 CDE 6.20 Fluid Pack lot # [id number]  . CATARACT EXTRACTION W/PHACO Left 04/15/2017   Procedure: CATARACT  EXTRACTION PHACO AND INTRAOCULAR LENS PLACEMENT ([place])  Left;  Surgeon: Leandrew Koyanagi, MD;  Location: [place];  Service: Ophthalmology;  Laterality: Left;  . COLONOSCOPY WITH PROPOFOL N/A 09/15/2017   Procedure: COLONOSCOPY WITH PROPOFOL;  Surgeon: Lucilla Lame, MD;  Location: [facility] ENDOSCOPY;  Service: Endoscopy;  Laterality: N/A;  . COLONOSCOPY WITH PROPOFOL N/A 10/26/2018   Procedure: COLONOSCOPY WITH PROPOFOL;  Surgeon: Lucilla Lame, MD;  Location: [facility] ENDOSCOPY;  Service: Endoscopy;  Laterality: N/A;  . CORONARY ARTERY BYPASS GRAFT  1998   LIMA-LAD, SVG-OM, SVG-RPDA, SVG-DIAG  . CPET / MET  12/2012   Mild chronotropic incompetence - read 82% of predicted; also reduced effort; peak VO2 15.7 / 75% (did not reach Max effort) -- suggested ischemic response in last 1.5 minutes of exercise. Normal pulmonary function on PFTs but poor response to  . CYSTOSCOPY W/ RETROGRADES Left 01/13/2020   Procedure: CYSTOSCOPY WITH RETROGRADE PYELOGRAM;  Surgeon: Billey Co, MD;  Location: [facility] ORS;  Service: Urology;  Laterality: Left;  . CYSTOSCOPY WITH STENT PLACEMENT Left 12/26/2019   Procedure: CYSTOSCOPY WITH STENT PLACEMENT;  Surgeon: Billey Co, MD;  Location: [facility] ORS;  Service: Urology;  Laterality: Left;  . CYSTOSCOPY/URETEROSCOPY/HOLMIUM LASER/STENT PLACEMENT Left 01/13/2020   Procedure: CYSTOSCOPY/URETEROSCOPY/HOLMIUM LASER/STENT EXCHANGE;  Surgeon: Billey Co, MD;  Location: [facility] ORS;  Service: Urology;  Laterality: Left;  . ENDOVASCULAR REPAIR/STENT GRAFT N/A 05/18/2019   Procedure: ENDOVASCULAR REPAIR/STENT GRAFT;  Surgeon: Algernon Huxley, MD;  Location: [facility] INVASIVE CV LAB;; EVAR - 23 mm prox, 12 cm distal & x 18 cm length Gore Excluder Endoprosthesis Main body (via RFA distal to lowest Renal A); 38m x 14 cm L Contralateral Limb for L Iliac - extended with 8 mm x 6cm LifeStar stent; no additional R Iliac PTA needed.   . ESOPHAGOGASTRODUODENOSCOPY (EGD) WITH PROPOFOL N/A  09/15/2017   Procedure: ESOPHAGOGASTRODUODENOSCOPY (EGD) WITH PROPOFOL;  Surgeon: WLucilla Lame MD;  [facility];  Service: Endoscopy;  Laterality: N/A;  . FRACTURE SURGERY Right 03/19/2015   wrist  . FRACTURE SURGERY Left   . HIATAL HERNIA REPAIR    . ILIAC ARTERY STENT  12/15/2005   PTA and direct stenting rgt and lft common iliac arteries  . KYPHOPLASTY N/A 04/05/2019   Procedure: KYPHOPLASTY T12, L3, L4 L5;  Surgeon: MHessie Knows MD;  Location: [facility] ORS;  Service: Orthopedics;  Laterality: N/A;  . NM GATED MYOVIEW ([place])  05/'19; 8/'20   a) [facility]: Low Risk - no ischemia or infarct.  EF > 65%. no RWMA;; b) ([facility]: Normal wall motion.  No ischemia or infarction.  . OPEN REDUCTION INTERNAL FIXATION (ORIF) DISTAL RADIAL FRACTURE Left 01/13/2019  Procedure: OPEN REDUCTION INTERNAL FIXATION (ORIF) DISTAL  RADIAL FRACTURE - LEFT - SLEEP APNEA;  Surgeon: Hessie Knows, MD;  Location: [facility] ORS;  Service: Orthopedics;  Laterality: Left;  . ORIF ELBOW FRACTURE Right 01/01/2017   Procedure: OPEN REDUCTION INTERNAL FIXATION (ORIF) ELBOW/OLECRANON FRACTURE;  Surgeon: Hessie Knows, MD;  Location: [facility] ORS;  Service: Orthopedics;  Laterality: Right;  . ORIF WRIST FRACTURE Right 03/19/2015   Procedure: OPEN REDUCTION INTERNAL FIXATION (ORIF) WRIST FRACTURE;  Surgeon: Hessie Knows, MD;  Location: [facility] ORS;  Service: Orthopedics;  Laterality: Right;  . SHOULDER ARTHROSCOPY Right   . THORACIC AORTA - CAROTID ANGIOGRAM  October 2007   Dr. 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 TRANSTHORACIC ECHOCARDIOGRAM  February 2016   [facility]: Normal LV function. Dilated left atrium.  Domingo Dimes ECHOCARDIOGRAM  04/2019   [facility] April 20, 2019): EF 55-60 %.  Mild LVH.  Relatively normal valves.    There were no vitals filed for this visit.   Subjective Assessment - 06/05/20 1405    Subjective  patient presents for his LSVT first treatment, aide opts to wait in waiting room rather than join to learn exercises.    Pertinent History Patient is a pleasant 86 year old male who presents to PT for his LSVT eval with his significant other. Patient has been seen by this therapist in the past for balance, back pain, and strength. History of hallucinations, kidney stones, kyphoplasty T12, L3, L4, L5 performed in August 2020 by Dr. Rudene Christians. [facility] from [facility] dated 03/28/2019 report was reviewed today. Acute compression fractures are noted at T12, L3, and L4.PMH includes AAA, arthritis, atherosclerotic heart disease (s/p CABG), benign neoplasm of ascending/descending colon, COPD, CAD, dementia, dysrhythmia, falls, GERD, hemorrhoid, HOH, HTN, PAF, prostate cancer, osteoporosis, kidney stones, macular degeneration, Lewy Body Dementia.    Limitations Walking;Standing;House hold activities;Other (comment);Lifting;Sitting    How long can you sit comfortably? n/a    How long can you stand comfortably? needs a walker now, unsteady once standing    How long can you walk comfortably? uses a walker now; can walk in house.    Diagnostic tests MRI from [facility] dated 03/28/2019 report was reviewed today. Acute compression fractures are noted at T12, L3, and L4. He has facet arthropathy at the lower lumbar levels. At L3-4 he has moderate central stenosis.    Patient Stated Goals be more independent with ADLs and mobility    Currently in Pain? No/denies              Treatment:   Patient seen for LSVT Daily Session Maximal Daily Exercises for facilitation/coordination of movement Sustained movements are designed to rescale the amplitude of movement output for generalization to daily functional activities .Performed as follows for 1 set of 10 repetitions each multidirectional sustained movements  1) Floor to ceiling , cues to hold for 10 seconds,needs modeling for correct  positions , VC to reach out further and to reach up to the ceiling; 8x 2) Side to side multidirectional Repetitive movements performed in sitting and are designed to provide retraining effort needed for sustained muscle activation in tasks , cues to lean fwd and hold position x 10 counts: Patient does not move sideways well and is not able to transition or stretch out leg very far; 8x each side  3) Step and reach forward , cues for good knee flex, cues to move  UE and LE together, cues to rotate  arms up to pronate his forearms 4) Step and reach backwards , cues for BUE back, getting toe up and bending back knee 5) Step and reach sideways, cues to turn  head sideways, and turn head back to neutral, and to rotate arms and pronate forearms 6) Rock and reach forward/backward , cues to reach far fwd with UE's, patient not comfortable with feet apart , not rocking very far to get a good weight shift, not able to raise  heel or raise  toes much 7) Rock and reach sideways, cues for twist and look behind , only rotates side ways, unable to twist around or turn to look behind functional component task with supervision 5 reps and simulated activities for: 1. Sit to stand x   needs cues to begin with UE up for correct start position 2. enter/exit kitchen with rollator with focus on equal step length 3.putting on coat with punching into arm of coat 4.putting towel roll into/out of cupboard to reach and take out 5. Large step with rollator x5 each LE  Patient performed with instruction, verbal cues, tactile cues of therapist: goal: increase tissue extensibility, promote proper posture, improve mobility  Ambulate 190 ft with cues for big step and foot clearance, seated rest break, second set 210 ft with improved foot clearance with contact cue of BIG. With rollator and close CGA      Pt presents with slowness of movements and fatigues with therapeutic exercises. Patient needs verbal, tactile and modeling cues  with LSVT BIG exercises. Patient demonstrates difficulty with remembering to hold the seated exercises and the start and finish positions. Patient tolerated all interventions well this date and will benefit from continued skilled PT interventions to improve strength and balance and decrease risk of falling                   PT Education - 06/05/20 1405    Education provided Yes    Education Details LSVT program, exercises, daily protocol    Person(s) Educated Patient;Caregiver(s)    Methods Explanation;Demonstration;Tactile cues;Verbal cues;Handout    Comprehension Verbalized understanding;Returned demonstration;Verbal cues required;Tactile cues required;Need further instruction            PT Short Term Goals - 06/04/20 1446      PT SHORT TERM GOAL #1   Title Patient will be independent in home exercise program to improve strength/mobility for better functional independence with ADLs.    Baseline 10/4: will start program tomorrow    Time 2    Period Weeks    Status New    Target Date 06/18/20             PT Long Term Goals - 06/04/20 1447      PT LONG TERM GOAL #1   Title Patient will increase FOTO score to equal to or greater than 52% to demonstrate statistically significant improvement in mobility and quality of life.    Baseline 10/4: 47.3%    Time 4    Period Weeks    Status New    Target Date 07/02/20      PT LONG TERM GOAL #2   Title Patient will demonstrate an improved Berg Balance Score of > 41/56 as to demonstrate improved balance with ADLs such as sitting/standing and transfer balance and reduced fall risk.    Baseline 10/4: 35/56    Time 4    Period Weeks    Status New  Target Date 07/02/20      PT LONG TERM GOAL #3   Title Client will be able to get up from soft surfaces, including couch and reclinerin his home confidently on his 1st trial 100% of the time to allow him to safely access all areas of his home.    Baseline 10/4: needs help to  get up    Time 4    Period Weeks    Status New    Target Date 07/02/20      PT LONG TERM GOAL #4   Title Patient will improve his functional mobility be decreasing his cognitive TUG to < 15 seconds for improved mobility, stability, and safety with dual task to decrease fall risk.    Baseline 10/4: unable to perform cognitive aspect of test ( 29.19)    Time 4    Period Weeks    Status New    Target Date 07/02/20                 Plan - 06/05/20 1413    Clinical Impression Statement Pt educated throughout session about proper posture and technique with exercises. Improved exercise technique, movement at target joints, use of target muscles after min to mod verbal, visual, tactile cues. Min cueing needed to appropriately perform LSVT tasks with leg, hand, and head position. Decreased coordination demonstrated requiring consistent verbal cueing to correct form. Cognitive understanding of task was delayed. Patient continues to demonstrate some in coordination of movement with select exercises such as rock and reach and stepping backwards. Patient responds well to verbal and tactile cues to correct form and technique.  CGA to SBA for safety with activities.  Uses to increase intensity and amplitude of movements throughout session    Personal Factors and Comorbidities Age;Comorbidity 3+;Fitness;Past/Current Experience;Sex;Social Background;Time since onset of injury/illness/exacerbation;Transportation    Comorbidities AAA, arthritis, atherosclerotic heart disease, (s/p CABG), benign neoplasm of ascending/descending colon, COPD, CAD, dementia, dysrhythmia, frequent falls, GERD, hemorrhoid, HOH, HTN, PAF, prostate cancer.    Examination-Activity Limitations Bathing;Bend;Carry;Dressing;Continence;Stairs;Squat;Reach Overhead;Locomotion Level;Lift;Stand;Transfers;Toileting;Bed Mobility    Examination-Participation Restrictions Church;Cleaning;Community Activity;Driving;Interpersonal  Relationship;Laundry;Volunteer;Shop;Personal Finances;Meal Prep;Yard Work    [employer] Evolving/Moderate complexity    Rehab Potential Fair    PT Frequency 4x / week    PT Duration 4 weeks    PT Treatment/Interventions ADLs/Self Care Home Management;Aquatic Therapy;Cryotherapy;Moist Heat;Traction;Ultrasound;Therapeutic activities;Functional mobility training;Stair training;Gait training;DME Instruction;Therapeutic exercise;Balance training;Neuromuscular re-education;Patient/family education;Manual techniques;Passive range of motion;Energy conservation;Vestibular;Taping;Biofeedback;Electrical Stimulation;Iontophoresis 57m/ml Dexamethasone;Dry needling;Visual/perceptual remediation/compensation;Cognitive remediation    PT Next Visit Plan LSVT    Consulted and Agree with Plan of Care Patient;Family member/caregiver    Family Member Consulted significant other           Patient will benefit from skilled therapeutic intervention in order to improve the following deficits and impairments:  Abnormal gait, Cardiopulmonary status limiting activity, Decreased activity tolerance, Decreased balance, Decreased knowledge of precautions, Decreased endurance, Decreased coordination, Decreased cognition, Decreased knowledge of use of DME, Decreased mobility, Difficulty walking, Decreased safety awareness, Decreased strength, Impaired flexibility, Impaired perceived functional ability, Impaired sensation, Postural dysfunction, Improper body mechanics, Pain, Impaired vision/preception, Decreased range of motion  Visit Diagnosis: Unsteadiness on feet  Other abnormalities of gait and mobility  Muscle weakness (generalized)     Problem List Patient Active Problem List   Diagnosis Date Noted  . LBD (Lewy body dementia) ([place] 05/14/2020  . Hematuria   . Acute kidney injury superimposed on CKD ([place]   . Sepsis due to gram-negative UTI ([place] 12/26/2019  .  Left nephrolithiasis  12/26/2019  . Hydronephrosis, left 12/26/2019  . Status post abdominal aortic aneurysm (AAA) repair 04/15/2019  . Malignant neoplasm of descending colon ([place])   . Do not intubate, cardiopulmonary resuscitation (CPR)-only code status 10/08/2018  . Prostate cancer ([place]) 02/25/2018  . Prostatic cyst 01/15/2018  . 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 ([place]) 09/15/2017  . Blood in stool   . Abnormal feces   . Stricture and stenosis of esophagus   . Mild cognitive impairment 08/18/2017  . Senile purpura ([place]) 04/29/2017  . Anemia 04/29/2017  . Frequent falls 04/29/2017  . Simple chronic bronchitis ([place]) 12/17/2016  . Benign localized hyperplasia of prostate with urinary obstruction 07/15/2016  . Elevated PSA 07/15/2016  . Incomplete emptying of bladder 07/15/2016  . Urge incontinence 07/15/2016  . Hypothyroidism 04/25/2016  . Macular degeneration 04/25/2016  . Erectile dysfunction due to arterial insufficiency   . Ischemic heart disease with chronotropic incompetence   . CN (constipation) 02/10/2015  . DD (diverticular disease) 02/10/2015  . Weakness 02/10/2015  . Lichen planus 72/53/6644  . Restless leg 02/10/2015  . 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 B12 deficiency 02/10/2015  . COPD (chronic obstructive pulmonary disease) ([place]) 11/14/2014  . Post Inflammatory Lung Changes 11/14/2014  . Acute delirium 11/08/2014  . PAF (paroxysmal atrial fibrillation) (HCC) CHA2DS2-VASc = 4. AC = Eliquis   . Insomnia 12/09/2013  . OSA on CPAP   . Dyspnea on exertion - -essentially resolved with BB dose reduction & wgt loss 03/29/2013  . Overweight (BMI 25.0-29.9) -- Wgt back up 01/17/2013  . Atherosclerotic heart disease of native coronary artery without angina pectoris - s/p CABG, occluded SVG-D1; patent LIMA-LAD, SVG-OM, SVG- RCA   . Essential hypertension   . Hyperlipidemia with target LDL less than 70   . Aorto-iliac disease ([place])   . BCC (basal cell carcinoma), eyelid 01/03/2013  .  Allergic rhinitis 08/17/2009  . Adaptation reaction 05/28/2009  . Acid reflux 05/28/2009  . Arthritis, degenerative 05/28/2009  . 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 Carotid artery occlusion without infarction 06/01/2006  . PAD (peripheral artery disease) - bilateral common iliac stents 12/15/2005  . Atherosclerotic heart disease of artery bypass graft 09/01/1997   Janna Arch, PT, DPT   06/05/2020, 2:15 PM  [place] MAIN [facility] SERVICES [address] [place], [place], [address] Phone: [phone]   Fax:  [phone]  Name: [name] MRN: [id number] Date of Birth: 04/15/1934

## 2020-06-05 NOTE — Telephone Encounter (Signed)
Requested Prescriptions  Pending Prescriptions Disp Refills  . metoprolol succinate (TOPROL-XL) 50 MG 24 hr tablet [Pharmacy Med Name: METOPROLOL SUCCINATE ER 50 MG TAB] 30 tablet 1    Sig: TAKE ONE TABLET BY MOUTH EVERY DAY TAKE WITH OR IMMEDIATELY FOLLOWING A MEAL     Cardiovascular:  Beta Blockers Passed - 06/05/2020  1:07 PM      Passed - Last BP in normal range    BP Readings from Last 1 Encounters:  05/28/20 (!) 138/59         Passed - Last Heart Rate in normal range    Pulse Readings from Last 1 Encounters:  05/28/20 62         Passed - Valid encounter within last 6 months    Recent Outpatient Visits          1 week ago Essential hypertension   [facility] [name], MD   3 weeks ago Other fatigue   [facility] [name], MD   2 months ago Vivid dream   [facility] [name], MD   4 months ago Acute kidney injury superimposed on CKD [facility])   [facility] [name], MD   11 months ago Loss of appetite   [facility] [name], MD      Future Appointments            In 2 weeks Leonie Man, MD [facility], [facility]   In 1 month Fisher, Kirstie Peri, MD [facility], [place]   In 2 months Fisher, Kirstie Peri, MD [facility], [place]   In 5 months Muncy, Ronda Fairly, [facility]

## 2020-06-06 ENCOUNTER — Ambulatory Visit: Payer: Medicare Other

## 2020-06-06 ENCOUNTER — Ambulatory Visit: Payer: Medicare Other | Admitting: Speech Pathology

## 2020-06-06 ENCOUNTER — Other Ambulatory Visit: Payer: Self-pay

## 2020-06-06 DIAGNOSIS — R2681 Unsteadiness on feet: Secondary | ICD-10-CM

## 2020-06-06 DIAGNOSIS — R2689 Other abnormalities of gait and mobility: Secondary | ICD-10-CM

## 2020-06-06 DIAGNOSIS — M6281 Muscle weakness (generalized): Secondary | ICD-10-CM

## 2020-06-06 NOTE — Therapy (Signed)
[name] SERVICES [address] [place], [place], [address] Phone: [phone]   Fax:  [phone]  Physical Therapy Treatment  Patient Details  Name: [name] MRN: [id number] Date of Birth: April 29, 1934 Referring Provider (PT): Meeler, Sherren Kerns FNP   Encounter Date: 06/06/2020   PT End of Session - 06/06/20 1002    Visit Number 3    Number of Visits 17    Date for PT Re-Evaluation 07/02/20    Authorization Type LSVT eval 3/10 06/04/20    PT Start Time 0858    PT Stop Time 0957    PT Time Calculation (min) 59 min    Equipment Utilized During Treatment Gait belt    Activity Tolerance Patient tolerated treatment well;Patient limited by fatigue    Behavior During Therapy [facility] for tasks assessed/performed           Past Medical History:  Diagnosis Date  . AAA (abdominal aortic aneurysm) ([place]) 05/2019   [place]ativ[place] Rapid progression from June-Aug 2020 (4.6 - 5.4 cm): s/p EVAR (Dr. Lucky Cowboy [facility]): EVAR - 23 mm prox, 12 cm distal & x 18 cm length Gore Excluder Endoprosthesis Foster body (via RFA distal to lowest Renal A); 109m x 14 cm L Contralateral Limb for L Iliac - extended with 8 mm x 6cm LifeStar stent (post-dilated with 7 mm DEB).  Additional R Iliac PTA not required.   . Arthritis   . Basal cell carcinoma of eyelid 01/03/2013   2019 - R side of Nose  . Benign neoplasm of ascending colon   . Benign neoplasm of descending colon   . Bilateral iliac artery stenosis ([place] 2007   Bilateral Iliac A stenting; extension of EVAR limbs into both Iliacs -- additional stent placed in L Iliac.  . Cancer ([place]    Skin cancer  . Colon polyps   . Complication of anesthesia    severe confusion agitation requiring hospital admission x 4 days  . Compression fracture of fourth lumbar vertebra ([place] 04/2019   - s/p Kyphoplasty; T12, L3-5 ([facility]  . COPD (chronic obstructive pulmonary disease) (HCC)    Centrilobular Emphysema  . Coronary  artery disease involving native coronary artery without angina pectoris 1998   - s/p CABG, occluded SVG-D1; patent LIMA-LAD, SVG-OM, SVG- RCA  . Dementia ([place]    memory loss - started noting late 2019  . Falls frequently    broke foot  . Fracture 2021   left foot  . GERD (gastroesophageal reflux disease)   . Hemorrhoid 02/10/2015  . History of hiatal hernia   . History of kidney stones   . HOH (hard of hearing)   . Hypertension   . Lewy body dementia ([place] 04/04/2020  . Macular degeneration disease   . Osteoporosis   . PAF (paroxysmal atrial fibrillation) ([place]    on Eliquis for OAC - Rate Control   . Prostate cancer ([place]   . Prostate disorder   . Sciatic pain    Chronic  . Sleep apnea    cpap  . Temporary cerebral vascular dysfunction 02/10/2015   Had negative work-up.  July, 2012.  No medication changes, had forgot them that day, got dehydrated.   . Urinary incontinence     Past Surgical History:  Procedure Laterality Date  . ABDOMINAL AORTIC ENDOVASCULAR STENT GRAFT Bilateral 05/18/2019   Procedure: ABDOMINAL AORTIC ENDOVASCULAR STENT GRAFT;  Surgeon: DAlgernon Huxley MD;  Location: [facility] ORS;  Service: Vascular;  Laterality:  Bilateral;  . ANKLE SURGERY Left    ORIF   . Arch aortogram and carotid aortogram  06/02/2006   Dr Oneida Alar did surgery  . BASAL CELL CARCINOMA EXCISION  04/2016   Dermatology  . CARDIAC CATHETERIZATION  01/30/1998    (Dr. Linard Millers): Native LAD & mRCA CTO. 90% D1. Mod Cx. SVG-D1 CTO. SVG-RCA & SVG-OM along with LIMA-LAD patent;  LV NORMAL Fxn  . CAROTID ENDARTERECTOMY Left Oct. 29, 2007   Dr. Oneida Alar:  . CATARACT EXTRACTION W/PHACO Right 03/05/2017   Procedure: CATARACT EXTRACTION PHACO AND INTRAOCULAR LENS PLACEMENT (IOC);  Surgeon: Leandrew Koyanagi, MD;  Location: [facility] ORS;  Service: Ophthalmology;  Laterality: Right;  [place] 00:58.5 AP% 10.6 CDE 6.20 Fluid Pack lot # [id number]  . CATARACT EXTRACTION W/PHACO Left 04/15/2017   Procedure: CATARACT  EXTRACTION PHACO AND INTRAOCULAR LENS PLACEMENT ([place])  Left;  Surgeon: Leandrew Koyanagi, MD;  Location: [place];  Service: Ophthalmology;  Laterality: Left;  . COLONOSCOPY WITH PROPOFOL N/A 09/15/2017   Procedure: COLONOSCOPY WITH PROPOFOL;  Surgeon: Lucilla Lame, MD;  Location: [facility] ENDOSCOPY;  Service: Endoscopy;  Laterality: N/A;  . COLONOSCOPY WITH PROPOFOL N/A 10/26/2018   Procedure: COLONOSCOPY WITH PROPOFOL;  Surgeon: Lucilla Lame, MD;  Location: [facility] ENDOSCOPY;  Service: Endoscopy;  Laterality: N/A;  . CORONARY ARTERY BYPASS GRAFT  1998   LIMA-LAD, SVG-OM, SVG-RPDA, SVG-DIAG  . CPET / MET  12/2012   Mild chronotropic incompetence - read 82% of predicted; also reduced effort; peak VO2 15.7 / 75% (did not reach Max effort) -- suggested ischemic response in last 1.5 minutes of exercise. Normal pulmonary function on PFTs but poor response to  . CYSTOSCOPY W/ RETROGRADES Left 01/13/2020   Procedure: CYSTOSCOPY WITH RETROGRADE PYELOGRAM;  Surgeon: Billey Co, MD;  Location: [facility] ORS;  Service: Urology;  Laterality: Left;  . CYSTOSCOPY WITH STENT PLACEMENT Left 12/26/2019   Procedure: CYSTOSCOPY WITH STENT PLACEMENT;  Surgeon: Billey Co, MD;  Location: [facility] ORS;  Service: Urology;  Laterality: Left;  . CYSTOSCOPY/URETEROSCOPY/HOLMIUM LASER/STENT PLACEMENT Left 01/13/2020   Procedure: CYSTOSCOPY/URETEROSCOPY/HOLMIUM LASER/STENT EXCHANGE;  Surgeon: Billey Co, MD;  Location: [facility] ORS;  Service: Urology;  Laterality: Left;  . ENDOVASCULAR REPAIR/STENT GRAFT N/A 05/18/2019   Procedure: ENDOVASCULAR REPAIR/STENT GRAFT;  Surgeon: Algernon Huxley, MD;  Location: [facility] INVASIVE CV LAB;; EVAR - 23 mm prox, 12 cm distal & x 18 cm length Gore Excluder Endoprosthesis Foster body (via RFA distal to lowest Renal A); 38m x 14 cm L Contralateral Limb for L Iliac - extended with 8 mm x 6cm LifeStar stent; no additional R Iliac PTA needed.   . ESOPHAGOGASTRODUODENOSCOPY (EGD) WITH PROPOFOL N/A  09/15/2017   Procedure: ESOPHAGOGASTRODUODENOSCOPY (EGD) WITH PROPOFOL;  Surgeon: WLucilla Lame MD;  [facility];  Service: Endoscopy;  Laterality: N/A;  . FRACTURE SURGERY Right 03/19/2015   wrist  . FRACTURE SURGERY Left   . HIATAL HERNIA REPAIR    . ILIAC ARTERY STENT  12/15/2005   PTA and direct stenting rgt and lft common iliac arteries  . KYPHOPLASTY N/A 04/05/2019   Procedure: KYPHOPLASTY T12, L3, L4 L5;  Surgeon: MHessie Knows MD;  Location: [facility] ORS;  Service: Orthopedics;  Laterality: N/A;  . NM GATED MYOVIEW ([place])  05/'19; 8/'20   a) [facility]: Low Risk - no ischemia or infarct.  EF > 65%. no RWMA;; b) ([facility]: Normal wall motion.  No ischemia or infarction.  . OPEN REDUCTION INTERNAL FIXATION (ORIF) DISTAL RADIAL FRACTURE Left 01/13/2019  Procedure: OPEN REDUCTION INTERNAL FIXATION (ORIF) DISTAL  RADIAL FRACTURE - LEFT - SLEEP APNEA;  Surgeon: Hessie Knows, MD;  Location: [facility] ORS;  Service: Orthopedics;  Laterality: Left;  . ORIF ELBOW FRACTURE Right 01/01/2017   Procedure: OPEN REDUCTION INTERNAL FIXATION (ORIF) ELBOW/OLECRANON FRACTURE;  Surgeon: Hessie Knows, MD;  Location: [facility] ORS;  Service: Orthopedics;  Laterality: Right;  . ORIF WRIST FRACTURE Right 03/19/2015   Procedure: OPEN REDUCTION INTERNAL FIXATION (ORIF) WRIST FRACTURE;  Surgeon: Hessie Knows, MD;  Location: [facility] ORS;  Service: Orthopedics;  Laterality: Right;  . SHOULDER ARTHROSCOPY Right   . THORACIC AORTA - CAROTID ANGIOGRAM  October 2007   Dr. 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 TRANSTHORACIC ECHOCARDIOGRAM  February 2016   [facility]: Normal LV function. Dilated left atrium.  Domingo Dimes ECHOCARDIOGRAM  04/2019   [facility] April 20, 2019): EF 55-60 %.  Mild LVH.  Relatively normal valves.    There were no vitals filed for this visit.   Subjective Assessment - 06/06/20 1000    Subjective  Patient did not complete his second set of homework assignment yesterday, educated caregiver, aide, and patient on need for compliance. significant other reports he was so tired he went to bed early.    Pertinent History Patient is a pleasant 86 year old male who presents to PT for his LSVT eval with his significant other. Patient has been seen by this therapist in the past for balance, back pain, and strength. History of hallucinations, kidney stones, kyphoplasty T12, L3, L4, L5 performed in August 2020 by Dr. Rudene Christians. MRI from [facility] dated 03/28/2019 report was reviewed today. Acute compression fractures are noted at T12, L3, and L4.PMH includes AAA, arthritis, atherosclerotic heart disease (s/p CABG), benign neoplasm of ascending/descending colon, COPD, CAD, dementia, dysrhythmia, falls, GERD, hemorrhoid, HOH, HTN, PAF, prostate cancer, osteoporosis, kidney stones, macular degeneration, Lewy Body Dementia.    Limitations Walking;Standing;House hold activities;Other (comment);Lifting;Sitting    How long can you sit comfortably? n/a    How long can you stand comfortably? needs a walker now, unsteady once standing    How long can you walk comfortably? uses a walker now; can walk in house.    Diagnostic tests MRI from [facility] dated 03/28/2019 report was reviewed today. Acute compression fractures are noted at T12, L3, and L4. He has facet arthropathy at the lower lumbar levels. At L3-4 he has moderate central stenosis.    Patient Stated Goals be more independent with ADLs and mobility    Currently in Pain? No/denies                      Treatment:   Patient seen for LSVT Daily Session Adapted Maximal  Daily Exercises for facilitation/coordination of movement Sustained movements are designed to rescale the amplitude of movement output for generalization to daily functional activities .Performed as follows for 1 set of 10 repetitions each multidirectional  sustained movements  1) Floor to ceiling , cues to hold for 10 seconds,needs modeling for correct positions , VC to reach out further and to reach up to the ceiling; 8x 2) Side to side multidirectional Repetitive movements performed in sitting and are designed to provide retraining effort needed for sustained muscle activation in tasks , cues to lean fwd and hold position x 10 counts: Patient does not move fwd<>sidesitting<> to fwd well to the R but is better  to the left and is not able to transition or stretch out leg very far 8x each side  3) Step and reach forward , cues for good knee flex, cues to move UE and LE together, cues to rotate  arms up to pronate his forearms 4) Step and reach backwards , cues for BUE back, getting toe up and bending back knee 5) Step and reach sideways, cues to turn  head sideways, and turn head back to neutral, and to rotate arms and pronate forearms; unable to pronate forearms this session due to cognition with dual task.  6) step/rock backwards and reach,  cues to reach far fwd with UE's, patient not comfortable with feet apart , not rocking very far to get a good weight shift, not able to raise  heel or raise  toes much; steps forward rather than together and relies heavily on UE support on chair.  7) Rock and reach forward, cues to reach far fwd with UE's, patient not comfortable with feet apart , not rocking very far to get a good weight shift, not able to raise  heel or raise  toes much;  relies heavily on UE support on chair. One near LOB due to confusion  8) Rock and reach sideways, cues for twist and look behind , only rotates side ways, unable to twist around or turn to look behind   functional component task with supervision 5 reps and simulated activities for: Sit to standxneeds cues to begin with UE up for correct start position 2.enter/exit kitchen with rollator with focus on equal step length 3.putting on coat with punching into arm of coat 4.putting  towel roll into/out of cupboard to reach and take out 5. Large step with rollator x5 each LE  Patient performed with instruction, verbal cues, tactile cues of therapist: goal: increase tissue extensibility, promote proper posture, improve mobility  Pt educated throughout session about proper posture and technique with exercises. Improved exercise technique, movement at target joints, use of target muscles after min to mod verbal, visual, tactile cues.  Ambulate 190 ft with cues for big step and foot clearance, seated rest break, second set 210 ft with improved foot clearance with contact cue of BIG. With rollator and close CGA  Homework of the day: large handshake 3x  Pt presents with slowness of movements and fatigues with therapeutic exercises. Patient needs verbal, tactile and modeling cues with LSVT BIG exercises. Patient demonstrates difficulty with remembering to hold the seated exercises and the start and finish positions. Patient tolerated all interventions well this date and will benefit from continued skilled PT interventions to improve strength and balance and decrease risk of falling                 PT Education - 06/06/20 1001    Education provided Yes    Education Details LSVT, compliance to program.    Person(s) Educated Patient    Methods Explanation;Demonstration;Tactile cues;Verbal cues    Comprehension Verbalized understanding;Returned demonstration;Verbal cues required;Tactile cues required            PT Short Term Goals - 06/04/20 1446      PT SHORT TERM GOAL #1   Title Patient will be independent in home exercise program to improve strength/mobility for better functional independence with ADLs.    Baseline 10/4: will start program tomorrow    Time 2    Period Weeks    Status New    Target Date 06/18/20  PT Long Term Goals - 06/04/20 1447      PT LONG TERM GOAL #1   Title Patient will increase FOTO score to equal to or greater than  52% to demonstrate statistically significant improvement in mobility and quality of life.    Baseline 10/4: 47.3%    Time 4    Period Weeks    Status New    Target Date 07/02/20      PT LONG TERM GOAL #2   Title Patient will demonstrate an improved Berg Balance Score of > 41/56 as to demonstrate improved balance with ADLs such as sitting/standing and transfer balance and reduced fall risk.    Baseline 10/4: 35/56    Time 4    Period Weeks    Status New    Target Date 07/02/20      PT LONG TERM GOAL #3   Title Client will be able to get up from soft surfaces, including couch and reclinerin his home confidently on his 1st trial 100% of the time to allow him to safely access all areas of his home.    Baseline 10/4: needs help to get up    Time 4    Period Weeks    Status New    Target Date 07/02/20      PT LONG TERM GOAL #4   Title Patient will improve his functional mobility be decreasing his cognitive TUG to < 15 seconds for improved mobility, stability, and safety with dual task to decrease fall risk.    Baseline 10/4: unable to perform cognitive aspect of test ( 29.19)    Time 4    Period Weeks    Status New    Target Date 07/02/20                 Plan - 06/06/20 1004    Clinical Impression Statement Patient is more fatigued this session requiring increased rest breaks between activities to allow for fatigue to reduce and reduce risk of LOB. Patient requires education on compliance of program at home for full benefit of program, cannot continue with LSVT if not complying with home portion. Caregiver, son, and significant other educated on need as well. Max cueing for sequencing required throughout session. Continue to progress toward improved balance and reduced need for cues to sustain normalized amplitude with mobility and functional tasks.    Personal Factors and Comorbidities Age;Comorbidity 3+;Fitness;Past/Current Experience;Sex;Social Background;Time since onset of  injury/illness/exacerbation;Transportation    Comorbidities AAA, arthritis, atherosclerotic heart disease, (s/p CABG), benign neoplasm of ascending/descending colon, COPD, CAD, dementia, dysrhythmia, frequent falls, GERD, hemorrhoid, HOH, HTN, PAF, prostate cancer.    Examination-Activity Limitations Bathing;Bend;Carry;Dressing;Continence;Stairs;Squat;Reach Overhead;Locomotion Level;Lift;Stand;Transfers;Toileting;Bed Mobility    Examination-Participation Restrictions Church;Cleaning;Community Activity;Driving;Interpersonal Relationship;Laundry;Volunteer;Shop;Personal Finances;Meal Prep;Yard Work    [employer] Evolving/Moderate complexity    Rehab Potential Fair    PT Frequency 4x / week    PT Duration 4 weeks    PT Treatment/Interventions ADLs/Self Care Home Management;Aquatic Therapy;Cryotherapy;Moist Heat;Traction;Ultrasound;Therapeutic activities;Functional mobility training;Stair training;Gait training;DME Instruction;Therapeutic exercise;Balance training;Neuromuscular re-education;Patient/family education;Manual techniques;Passive range of motion;Energy conservation;Vestibular;Taping;Biofeedback;Electrical Stimulation;Iontophoresis 55m/ml Dexamethasone;Dry needling;Visual/perceptual remediation/compensation;Cognitive remediation    PT Next Visit Plan LSVT    Consulted and Agree with Plan of Care Patient;Family member/caregiver    Family Member Consulted significant other           Patient will benefit from skilled therapeutic intervention in order to improve the following deficits and impairments:  Abnormal gait, Cardiopulmonary status limiting activity, Decreased activity tolerance, Decreased balance, Decreased knowledge of  precautions, Decreased endurance, Decreased coordination, Decreased cognition, Decreased knowledge of use of DME, Decreased mobility, Difficulty walking, Decreased safety awareness, Decreased strength, Impaired flexibility, Impaired perceived  functional ability, Impaired sensation, Postural dysfunction, Improper body mechanics, Pain, Impaired vision/preception, Decreased range of motion  Visit Diagnosis: Unsteadiness on feet  Other abnormalities of gait and mobility  Muscle weakness (generalized)     Problem List Patient Active Problem List   Diagnosis Date Noted  . LBD (Lewy body dementia) ([place]) 05/14/2020  . Hematuria   . Acute kidney injury superimposed on CKD ([place])   . Sepsis due to gram-negative UTI ([place]) 12/26/2019  . Left nephrolithiasis 12/26/2019  . Hydronephrosis, left 12/26/2019  . Status post abdominal aortic aneurysm (AAA) repair 04/15/2019  . Malignant neoplasm of descending colon ([place])   . Do not intubate, cardiopulmonary resuscitation (CPR)-only code status 10/08/2018  . Prostate cancer ([place]) 02/25/2018  . Prostatic cyst 01/15/2018  . 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 ([place]) 09/15/2017  . Blood in stool   . Abnormal feces   . Stricture and stenosis of esophagus   . Mild cognitive impairment 08/18/2017  . Senile purpura ([place]) 04/29/2017  . Anemia 04/29/2017  . Frequent falls 04/29/2017  . Simple chronic bronchitis ([place]) 12/17/2016  . Benign localized hyperplasia of prostate with urinary obstruction 07/15/2016  . Elevated PSA 07/15/2016  . Incomplete emptying of bladder 07/15/2016  . Urge incontinence 07/15/2016  . Hypothyroidism 04/25/2016  . Macular degeneration 04/25/2016  . Erectile dysfunction due to arterial insufficiency   . Ischemic heart disease with chronotropic incompetence   . CN (constipation) 02/10/2015  . DD (diverticular disease) 02/10/2015  . Weakness 02/10/2015  . Lichen planus 16/09/930  . Restless leg 02/10/2015  . 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 B12 deficiency 02/10/2015  . COPD (chronic obstructive pulmonary disease) ([place]) 11/14/2014  . Post Inflammatory Lung Changes 11/14/2014  . Acute delirium 11/08/2014  . PAF (paroxysmal atrial fibrillation) (HCC)  CHA2DS2-VASc = 4. AC = Eliquis   . Insomnia 12/09/2013  . OSA on CPAP   . Dyspnea on exertion - -essentially resolved with BB dose reduction & wgt loss 03/29/2013  . Overweight (BMI 25.0-29.9) -- Wgt back up 01/17/2013  . Atherosclerotic heart disease of native coronary artery without angina pectoris - s/p CABG, occluded SVG-D1; patent LIMA-LAD, SVG-OM, SVG- RCA   . Essential hypertension   . Hyperlipidemia with target LDL less than 70   . Aorto-iliac disease ([place])   . BCC (basal cell carcinoma), eyelid 01/03/2013  . Allergic rhinitis 08/17/2009  . Adaptation reaction 05/28/2009  . Acid reflux 05/28/2009  . Arthritis, degenerative 05/28/2009  . 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 Carotid artery occlusion without infarction 06/01/2006  . PAD (peripheral artery disease) - bilateral common iliac stents 12/15/2005  . Atherosclerotic heart disease of artery bypass graft 09/01/1997   Janna Arch, PT, DPT   06/06/2020, 10:05 AM  [facility] SERVICES [address] [place], [place], [address] Phone: [phone]   Fax:  [phone]  Name: [name] MRN: [id number] Date of Birth: 1933-11-18

## 2020-06-07 ENCOUNTER — Ambulatory Visit: Payer: Medicare Other | Admitting: Radiation Oncology

## 2020-06-08 ENCOUNTER — Ambulatory Visit: Payer: Medicare Other

## 2020-06-08 ENCOUNTER — Other Ambulatory Visit: Payer: Self-pay

## 2020-06-08 DIAGNOSIS — M6281 Muscle weakness (generalized): Secondary | ICD-10-CM

## 2020-06-08 DIAGNOSIS — R2681 Unsteadiness on feet: Secondary | ICD-10-CM | POA: Diagnosis not present

## 2020-06-08 DIAGNOSIS — R2689 Other abnormalities of gait and mobility: Secondary | ICD-10-CM

## 2020-06-08 NOTE — Therapy (Signed)
[place] MAIN [facility] SERVICES [address] [place], [place], [address] Phone: [phone]   Fax:  [phone]  Physical Therapy Treatment  Patient Details  Name: [name] MRN: [id number] Date of Birth: Dec 08, 1933 Referring Provider (PT): Meeler, Sherren Kerns FNP   Encounter Date: 06/08/2020   PT End of Session - 06/08/20 1003    Visit Number 4    Number of Visits 17    Date for PT Re-Evaluation 07/02/20    Authorization Type LSVT eval 3/10 06/04/20    PT Start Time 0900    PT Stop Time 0951    PT Time Calculation (min) 51 min    Equipment Utilized During Treatment Gait belt    Activity Tolerance Patient tolerated treatment well;Patient limited by fatigue    Behavior During Therapy [facility] for tasks assessed/performed           Past Medical History:  Diagnosis Date  . AAA (abdominal aortic aneurysm) ([place]) 05/2019   Relativley Rapid progression from June-Aug 2020 (4.6 - 5.4 cm): s/p EVAR (Dr. Lucky Cowboy [facility]): EVAR - 23 mm prox, 12 cm distal & x 18 cm length Gore Excluder Endoprosthesis Main body (via RFA distal to lowest Renal A); 58m x 14 cm L Contralateral Limb for L Iliac - extended with 8 mm x 6cm LifeStar stent (post-dilated with 7 mm DEB).  Additional R Iliac PTA not required.   . Arthritis   . Basal cell carcinoma of eyelid 01/03/2013   2019 - R side of Nose  . Benign neoplasm of ascending colon   . Benign neoplasm of descending colon   . Bilateral iliac artery stenosis ([place] 2007   Bilateral Iliac A stenting; extension of EVAR limbs into both Iliacs -- additional stent placed in L Iliac.  . Cancer ([place]    Skin cancer  . Colon polyps   . Complication of anesthesia    severe confusion agitation requiring hospital admission x 84 days  . Compression fracture of fourth lumbar vertebra ([place] 04/2019   - s/p Kyphoplasty; T12, L3-5 ([facility]  . COPD (chronic obstructive pulmonary disease) (HCC)    Centrilobular Emphysema  . Coronary  artery disease involving native coronary artery without angina pectoris 1998   - s/p CABG, occluded SVG-D1; patent LIMA-LAD, SVG-OM, SVG- RCA  . Dementia ([place]    memory loss - started noting late 2019  . Falls frequently    broke foot  . Fracture 2021   left foot  . GERD (gastroesophageal reflux disease)   . Hemorrhoid 02/10/2015  . History of hiatal hernia   . History of kidney stones   . HOH (hard of hearing)   . Hypertension   . Lewy body dementia ([place] 04/04/2020  . Macular degeneration disease   . Osteoporosis   . PAF (paroxysmal atrial fibrillation) ([place]    on Eliquis for OAC - Rate Control   . Prostate cancer ([place]   . Prostate disorder   . Sciatic pain    Chronic  . Sleep apnea    cpap  . Temporary cerebral vascular dysfunction 02/10/2015   Had negative work-up.  July, 2012.  No medication changes, had forgot them that day, got dehydrated.   . Urinary incontinence     Past Surgical History:  Procedure Laterality Date  . ABDOMINAL AORTIC ENDOVASCULAR STENT GRAFT Bilateral 05/18/2019   Procedure: ABDOMINAL AORTIC ENDOVASCULAR STENT GRAFT;  Surgeon: DAlgernon Huxley MD;  Location: [facility] ORS;  Service: Vascular;  Laterality:  Bilateral;  . ANKLE SURGERY Left    ORIF   . Arch aortogram and carotid aortogram  06/02/2006   Dr Oneida Alar did surgery  . BASAL CELL CARCINOMA EXCISION  04/2016   Dermatology  . CARDIAC CATHETERIZATION  01/30/1998    (Dr. Linard Millers): Native LAD & mRCA CTO. 90% D1. Mod Cx. SVG-D1 CTO. SVG-RCA & SVG-OM along with LIMA-LAD patent;  LV NORMAL Fxn  . CAROTID ENDARTERECTOMY Left Oct. 29, 2007   Dr. Oneida Alar:  . CATARACT EXTRACTION W/PHACO Right 03/05/2017   Procedure: CATARACT EXTRACTION PHACO AND INTRAOCULAR LENS PLACEMENT (IOC);  Surgeon: Leandrew Koyanagi, MD;  Location: [facility] ORS;  Service: Ophthalmology;  Laterality: Right;  [place] 00:58.5 AP% 10.6 CDE 6.20 Fluid Pack lot # [id number]  . CATARACT EXTRACTION W/PHACO Left 04/15/2017   Procedure: CATARACT  EXTRACTION PHACO AND INTRAOCULAR LENS PLACEMENT ([place])  Left;  Surgeon: Leandrew Koyanagi, MD;  Location: [place];  Service: Ophthalmology;  Laterality: Left;  . COLONOSCOPY WITH PROPOFOL N/A 09/15/2017   Procedure: COLONOSCOPY WITH PROPOFOL;  Surgeon: Lucilla Lame, MD;  Location: [facility] ENDOSCOPY;  Service: Endoscopy;  Laterality: N/A;  . COLONOSCOPY WITH PROPOFOL N/A 10/26/2018   Procedure: COLONOSCOPY WITH PROPOFOL;  Surgeon: Lucilla Lame, MD;  Location: [facility] ENDOSCOPY;  Service: Endoscopy;  Laterality: N/A;  . CORONARY ARTERY BYPASS GRAFT  1998   LIMA-LAD, SVG-OM, SVG-RPDA, SVG-DIAG  . CPET / MET  12/2012   Mild chronotropic incompetence - read 82% of predicted; also reduced effort; peak VO2 15.7 / 75% (did not reach Max effort) -- suggested ischemic response in last 1.5 minutes of exercise. Normal pulmonary function on PFTs but poor response to  . CYSTOSCOPY W/ RETROGRADES Left 01/13/2020   Procedure: CYSTOSCOPY WITH RETROGRADE PYELOGRAM;  Surgeon: Billey Co, MD;  Location: [facility] ORS;  Service: Urology;  Laterality: Left;  . CYSTOSCOPY WITH STENT PLACEMENT Left 12/26/2019   Procedure: CYSTOSCOPY WITH STENT PLACEMENT;  Surgeon: Billey Co, MD;  Location: [facility] ORS;  Service: Urology;  Laterality: Left;  . CYSTOSCOPY/URETEROSCOPY/HOLMIUM LASER/STENT PLACEMENT Left 01/13/2020   Procedure: CYSTOSCOPY/URETEROSCOPY/HOLMIUM LASER/STENT EXCHANGE;  Surgeon: Billey Co, MD;  Location: [facility] ORS;  Service: Urology;  Laterality: Left;  . ENDOVASCULAR REPAIR/STENT GRAFT N/A 05/18/2019   Procedure: ENDOVASCULAR REPAIR/STENT GRAFT;  Surgeon: Algernon Huxley, MD;  Location: [facility] INVASIVE CV LAB;; EVAR - 23 mm prox, 12 cm distal & x 18 cm length Gore Excluder Endoprosthesis Main body (via RFA distal to lowest Renal A); 38m x 14 cm L Contralateral Limb for L Iliac - extended with 8 mm x 6cm LifeStar stent; no additional R Iliac PTA needed.   . ESOPHAGOGASTRODUODENOSCOPY (EGD) WITH PROPOFOL N/A  09/15/2017   Procedure: ESOPHAGOGASTRODUODENOSCOPY (EGD) WITH PROPOFOL;  Surgeon: WLucilla Lame MD;  [facility];  Service: Endoscopy;  Laterality: N/A;  . FRACTURE SURGERY Right 03/19/2015   wrist  . FRACTURE SURGERY Left   . HIATAL HERNIA REPAIR    . ILIAC ARTERY STENT  12/15/2005   PTA and direct stenting rgt and lft common iliac arteries  . KYPHOPLASTY N/A 04/05/2019   Procedure: KYPHOPLASTY T12, L3, L4 L5;  Surgeon: MHessie Knows MD;  Location: [facility] ORS;  Service: Orthopedics;  Laterality: N/A;  . NM GATED MYOVIEW ([place])  05/'19; 8/'20   a) [facility]: Low Risk - no ischemia or infarct.  EF > 65%. no RWMA;; b) ([facility]: Normal wall motion.  No ischemia or infarction.  . OPEN REDUCTION INTERNAL FIXATION (ORIF) DISTAL RADIAL FRACTURE Left 01/13/2019  Procedure: OPEN REDUCTION INTERNAL FIXATION (ORIF) DISTAL  RADIAL FRACTURE - LEFT - SLEEP APNEA;  Surgeon: Hessie Knows, MD;  Location: [facility] ORS;  Service: Orthopedics;  Laterality: Left;  . ORIF ELBOW FRACTURE Right 01/01/2017   Procedure: OPEN REDUCTION INTERNAL FIXATION (ORIF) ELBOW/OLECRANON FRACTURE;  Surgeon: Hessie Knows, MD;  Location: [facility] ORS;  Service: Orthopedics;  Laterality: Right;  . ORIF WRIST FRACTURE Right 03/19/2015   Procedure: OPEN REDUCTION INTERNAL FIXATION (ORIF) WRIST FRACTURE;  Surgeon: Hessie Knows, MD;  Location: [facility] ORS;  Service: Orthopedics;  Laterality: Right;  . SHOULDER ARTHROSCOPY Right   . THORACIC AORTA - CAROTID ANGIOGRAM  October 2007   Dr. 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 TRANSTHORACIC ECHOCARDIOGRAM  February 2016   [facility]: Normal LV function. Dilated left atrium.  Domingo Dimes ECHOCARDIOGRAM  04/2019   [facility] April 20, 2019): EF 55-60 %.  Mild LVH.  Relatively normal valves.    There were no vitals filed for this visit.   Subjective Assessment - 06/08/20 1001    Subjective  Patient presents with significant other. Did his exercises twice yesterday but only once wednesday and did not do his extra homework.    Pertinent History Patient is a pleasant 86 year old male who presents to PT for his LSVT eval with his significant other. Patient has been seen by this therapist in the past for balance, back pain, and strength. History of hallucinations, kidney stones, kyphoplasty T12, L3, L4, L5 performed in August 2020 by Dr. Rudene Christians. MRI from [facility] dated 03/28/2019 report was reviewed today. Acute compression fractures are noted at T12, L3, and L4.PMH includes AAA, arthritis, atherosclerotic heart disease (s/p CABG), benign neoplasm of ascending/descending colon, COPD, CAD, dementia, dysrhythmia, falls, GERD, hemorrhoid, HOH, HTN, PAF, prostate cancer, osteoporosis, kidney stones, macular degeneration, Lewy Body Dementia.    Limitations Walking;Standing;House hold activities;Other (comment);Lifting;Sitting    How long can you sit comfortably? n/a    How long can you stand comfortably? needs a walker now, unsteady once standing    How long can you walk comfortably? uses a walker now; can walk in house.    Diagnostic tests [facility] from [facility] dated 03/28/2019 report was reviewed today. Acute compression fractures are noted at T12, L3, and L4. He has facet arthropathy at the lower lumbar levels. At L3-4 he has moderate central stenosis.    Patient Stated Goals be more independent with ADLs and mobility    Currently in Pain? No/denies               Treatment: Patient seen for LSVT Daily Session Adapted Maximal  Daily Exercises for facilitation/coordination of movement Sustained movements are designed to rescale the amplitude of movement output for generalization to daily functional activities .Performed as follows for 1 set of 10 repetitions each multidirectional sustained movements  1) Floor to ceiling , cues to hold for 10 seconds,needs  modeling for correct positions, VC to reach out furtherand to reach up to the ceiling; 8x 2) Side to side multidirectional Repetitive movements performed in sittingand are designed to provide retraining effort needed for sustained muscle activation in tasks, cues to lean fwd and hold position x 10 counts: Patient does not move fwd<>sidesitting<> to fwd well to the R but is better to the left and is not able to transition or stretch out leg very far 8x each side  3) Step and reach forward ,  cues for good knee flex, cues to move UE and LE together, cues to rotate arms up to pronate his forearms 4) Step and reach backwards , cues for BUE back, getting toe up and bending back knee 5) Step and reach sideways, cues to turn head sideways, and turn head back to neutral, and to rotate arms and pronate forearms; unable to pronate forearms this session due to cognition with dual task.  6) step/rock backwards and reach,  cues to reach far fwd with UE's, patient not comfortable with feet apart, not rocking very far to get a good weight shift, not able to raise heel or raise toes much; steps forward rather than together and relies heavily on UE support on chair.  7) Rock and reach forward, cues to reach far fwd with UE's, patient not comfortable with feet apart, not rocking very far to get a good weight shift, not able to raise heel or raise toes much;  relies heavily on UE support on chair.   8) Rock and reach sideways, cues for twist and look behind, only rotates side ways, unable to twist around or turn to look behind   functional component task with supervision 5 reps and simulated activities for: Sit to standxneeds cues to begin with UE up for correct start position 2.enter/exit kitchen with rollator with focus on equal step length 3.putting on coat with punching into arm of coat 4.putting towel roll into/out of cupboard to reach and take out 5.Large step with rollator x5 each LE Patient  performed with instruction, verbal cues, tactile cues of therapist: goal:increase tissue extensibility, promote proper posture, improve mobility Pt educated throughout session about proper posture and technique with exercises. Improved exercise technique, movement at target joints, use of target muscles after min to mod verbal, visual, tactile cues.  Homework of the day: big hugs5x, Saturday: pick up/put down 5 things on a counter, Sunday: open/close a door 5x  Pt presents with slowness of movements and fatigues with therapeutic exercises. Patient needs verbal, tactile and modeling cues with LSVT BIG exercises. Patient demonstrates difficulty with remembering to hold the seated exercises and the start and finish positions. Patient tolerated all interventions well this date and will benefit from continued skilled PT interventions to improve strength and balance and decrease risk of falling   Patient session shorted and unable to perform ambulation this session due to restroom issue. Patient educated again on need for compliance with home portion of program. Will be given a trial period over the weekend to assess appropriateness of patient for this program due to noncompliance. Patient and patient's significant other verbalized understanding. Patient lost his handouts and so was given additional copies. Max cueing for sequencing required throughout session. Continue to progress toward improved balance and reduced need for cues to sustain normalized amplitude with mobility and functional tasks.                      PT Education - 06/08/20 1002    Education provided Yes    Education Details LSVT, compliance to program, will not be able to continue if not compliant    Person(s) Educated Patient;Caregiver(s)    Methods Explanation;Demonstration;Tactile cues;Verbal cues    Comprehension Verbalized understanding;Returned demonstration;Verbal cues required;Tactile cues required             PT Short Term Goals - 06/04/20 1446      PT SHORT TERM GOAL #1   Title Patient will be independent in home exercise program  to improve strength/mobility for better functional independence with ADLs.    Baseline 10/4: will start program tomorrow    Time 2    Period Weeks    Status New    Target Date 06/18/20             PT Long Term Goals - 06/04/20 1447      PT LONG TERM GOAL #1   Title Patient will increase FOTO score to equal to or greater than 52% to demonstrate statistically significant improvement in mobility and quality of life.    Baseline 10/4: 47.3%    Time 4    Period Weeks    Status New    Target Date 07/02/20      PT LONG TERM GOAL #2   Title Patient will demonstrate an improved Berg Balance Score of > 41/56 as to demonstrate improved balance with ADLs such as sitting/standing and transfer balance and reduced fall risk.    Baseline 10/4: 35/56    Time 4    Period Weeks    Status New    Target Date 07/02/20      PT LONG TERM GOAL #3   Title Client will be able to get up from soft surfaces, including couch and reclinerin his home confidently on his 1st trial 100% of the time to allow him to safely access all areas of his home.    Baseline 10/4: needs help to get up    Time 4    Period Weeks    Status New    Target Date 07/02/20      PT LONG TERM GOAL #4   Title Patient will improve his functional mobility be decreasing his cognitive TUG to < 15 seconds for improved mobility, stability, and safety with dual task to decrease fall risk.    Baseline 10/4: unable to perform cognitive aspect of test ( 29.19)    Time 4    Period Weeks    Status New    Target Date 07/02/20                 Plan - 06/08/20 1004    Clinical Impression Statement Patient session shorted and unable to perform ambulation this session due to restroom issue. Patient educated again on need for compliance with home portion of program. Will be given a trial period over the  weekend to assess appropriateness of patient for this program due to noncompliance. Patient and patient's significant other verbalized understanding. Patient lost his handouts and so was given additional copies. Max cueing for sequencing required throughout session. Continue to progress toward improved balance and reduced need for cues to sustain normalized amplitude with mobility and functional tasks.    Personal Factors and Comorbidities Age;Comorbidity 3+;Fitness;Past/Current Experience;Sex;Social Background;Time since onset of injury/illness/exacerbation;Transportation    Comorbidities AAA, arthritis, atherosclerotic heart disease, (s/p CABG), benign neoplasm of ascending/descending colon, COPD, CAD, dementia, dysrhythmia, frequent falls, GERD, hemorrhoid, HOH, HTN, PAF, prostate cancer.    Examination-Activity Limitations Bathing;Bend;Carry;Dressing;Continence;Stairs;Squat;Reach Overhead;Locomotion Level;Lift;Stand;Transfers;Toileting;Bed Mobility    Examination-Participation Restrictions Church;Cleaning;Community Activity;Driving;Interpersonal Relationship;Laundry;Volunteer;Shop;Personal Finances;Meal Prep;Yard Work    [employer] Evolving/Moderate complexity    Rehab Potential Fair    PT Frequency 4x / week    PT Duration 4 weeks    PT Treatment/Interventions ADLs/Self Care Home Management;Aquatic Therapy;Cryotherapy;Moist Heat;Traction;Ultrasound;Therapeutic activities;Functional mobility training;Stair training;Gait training;DME Instruction;Therapeutic exercise;Balance training;Neuromuscular re-education;Patient/family education;Manual techniques;Passive range of motion;Energy conservation;Vestibular;Taping;Biofeedback;Electrical Stimulation;Iontophoresis 38m/ml Dexamethasone;Dry needling;Visual/perceptual remediation/compensation;Cognitive remediation    PT Next Visit Plan LSVT    Consulted and  Agree with Plan of Care Patient;Family member/caregiver    Family Member  Consulted significant other           Patient will benefit from skilled therapeutic intervention in order to improve the following deficits and impairments:  Abnormal gait, Cardiopulmonary status limiting activity, Decreased activity tolerance, Decreased balance, Decreased knowledge of precautions, Decreased endurance, Decreased coordination, Decreased cognition, Decreased knowledge of use of DME, Decreased mobility, Difficulty walking, Decreased safety awareness, Decreased strength, Impaired flexibility, Impaired perceived functional ability, Impaired sensation, Postural dysfunction, Improper body mechanics, Pain, Impaired vision/preception, Decreased range of motion  Visit Diagnosis: Unsteadiness on feet  Other abnormalities of gait and mobility  Muscle weakness (generalized)     Problem List Patient Active Problem List   Diagnosis Date Noted  . LBD (Lewy body dementia) ([place]) 05/14/2020  . Hematuria   . Acute kidney injury superimposed on CKD ([place])   . Sepsis due to gram-negative UTI ([place]) 12/26/2019  . Left nephrolithiasis 12/26/2019  . Hydronephrosis, left 12/26/2019  . Status post abdominal aortic aneurysm (AAA) repair 04/15/2019  . Malignant neoplasm of descending colon ([place])   . Do not intubate, cardiopulmonary resuscitation (CPR)-only code status 10/08/2018  . Prostate cancer ([place]) 02/25/2018  . Prostatic cyst 01/15/2018  . 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 ([place]) 09/15/2017  . Blood in stool   . Abnormal feces   . Stricture and stenosis of esophagus   . Mild cognitive impairment 08/18/2017  . Senile purpura ([place]) 04/29/2017  . Anemia 04/29/2017  . Frequent falls 04/29/2017  . Simple chronic bronchitis ([place]) 12/17/2016  . Benign localized hyperplasia of prostate with urinary obstruction 07/15/2016  . Elevated PSA 07/15/2016  . Incomplete emptying of bladder 07/15/2016  . Urge incontinence 07/15/2016  . Hypothyroidism 04/25/2016  . Macular degeneration 04/25/2016  .  Erectile dysfunction due to arterial insufficiency   . Ischemic heart disease with chronotropic incompetence   . CN (constipation) 02/10/2015  . DD (diverticular disease) 02/10/2015  . Weakness 02/10/2015  . Lichen planus 05/30/5746  . Restless leg 02/10/2015  . 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 B12 deficiency 02/10/2015  . COPD (chronic obstructive pulmonary disease) ([place]) 11/14/2014  . Post Inflammatory Lung Changes 11/14/2014  . Acute delirium 11/08/2014  . PAF (paroxysmal atrial fibrillation) (HCC) CHA2DS2-VASc = 4. AC = Eliquis   . Insomnia 12/09/2013  . OSA on CPAP   . Dyspnea on exertion - -essentially resolved with BB dose reduction & wgt loss 03/29/2013  . Overweight (BMI 25.0-29.9) -- Wgt back up 01/17/2013  . Atherosclerotic heart disease of native coronary artery without angina pectoris - s/p CABG, occluded SVG-D1; patent LIMA-LAD, SVG-OM, SVG- RCA   . Essential hypertension   . Hyperlipidemia with target LDL less than 70   . Aorto-iliac disease ([place])   . BCC (basal cell carcinoma), eyelid 01/03/2013  . Allergic rhinitis 08/17/2009  . Adaptation reaction 05/28/2009  . Acid reflux 05/28/2009  . Arthritis, degenerative 05/28/2009  . 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 Carotid artery occlusion without infarction 06/01/2006  . PAD (peripheral artery disease) - bilateral common iliac stents 12/15/2005  . Atherosclerotic heart disease of artery bypass graft 09/01/1997    Janna Arch, PT, DPT   06/08/2020, 10:06 AM  [place] MAIN [facility] SERVICES [address] [place], [place], [address] Phone: [phone]   Fax:  [phone]  Name: MASON DIBIASIO MRN: [id number] Date of Birth: Feb 05, 1934

## 2020-06-11 ENCOUNTER — Other Ambulatory Visit: Payer: Self-pay

## 2020-06-11 ENCOUNTER — Ambulatory Visit: Payer: Medicare Other | Admitting: Speech Pathology

## 2020-06-11 ENCOUNTER — Ambulatory Visit: Payer: Medicare Other

## 2020-06-11 DIAGNOSIS — R2689 Other abnormalities of gait and mobility: Secondary | ICD-10-CM

## 2020-06-11 DIAGNOSIS — R2681 Unsteadiness on feet: Secondary | ICD-10-CM | POA: Diagnosis not present

## 2020-06-11 DIAGNOSIS — M6281 Muscle weakness (generalized): Secondary | ICD-10-CM

## 2020-06-11 NOTE — Therapy (Signed)
[place] MAIN [facility] SERVICES [address] [place], [place], [address] Phone: [phone]   Fax:  [phone]  Physical Therapy Treatment  Patient Details  Name: [name] MRN: [id number] Date of Birth: 11-29-1933 Referring Provider (PT): Meeler, Sherren Kerns FNP   Encounter Date: 06/11/2020   PT End of Session - 06/11/20 1105    Visit Number 5    Number of Visits 17    Date for PT Re-Evaluation 07/02/20    Authorization Type LSVT eval 5/10 06/04/20    PT Start Time 0957    PT Stop Time 1056    PT Time Calculation (min) 59 min    Equipment Utilized During Treatment Gait belt    Activity Tolerance Patient tolerated treatment well;Patient limited by fatigue    Behavior During Therapy WFL for tasks assessed/performed           Past Medical History:  Diagnosis Date  . AAA (abdominal aortic aneurysm) ([place]) 05/2019   [place]ativley Rapid progression from June-Aug 2020 (4.6 - 5.4 cm): s/p EVAR (Dr. Lucky Cowboy [facility]): EVAR - 23 mm prox, 12 cm distal & x 18 cm length Gore Excluder Endoprosthesis Main body (via RFA distal to lowest Renal A); 57mm x 14 cm L Contralateral Limb for L Iliac - extended with 8 mm x 6cm LifeStar stent (post-dilated with 7 mm DEB).  Additional R Iliac PTA not required.   . Arthritis   . Basal cell carcinoma of eyelid 01/03/2013   2019 - R side of Nose  . Benign neoplasm of ascending colon   . Benign neoplasm of descending colon   . Bilateral iliac artery stenosis ([place]) 2007   Bilateral Iliac A stenting; extension of EVAR limbs into both Iliacs -- additional stent placed in L Iliac.  . Cancer ([place])    Skin cancer  . Colon polyps   . Complication of anesthesia    severe confusion agitation requiring hospital admission x 4 days  . Compression fracture of fourth lumbar vertebra ([place]) 04/2019   - s/p Kyphoplasty; T12, L3-5 [facility])  . COPD (chronic obstructive pulmonary disease) (HCC)    Centrilobular Emphysema  . Coronary  artery disease involving native coronary artery without angina pectoris 1998   - s/p CABG, occluded SVG-D1; patent LIMA-LAD, SVG-OM, SVG- RCA  . Dementia ([place])    memory loss - started noting late 2019  . Falls frequently    broke foot  . Fracture 2021   left foot  . GERD (gastroesophageal reflux disease)   . Hemorrhoid 02/10/2015  . History of hiatal hernia   . History of kidney stones   . HOH (hard of hearing)   . Hypertension   . Lewy body dementia ([place]) 04/04/2020  . Macular degeneration disease   . Osteoporosis   . PAF (paroxysmal atrial fibrillation) ([place])    on Eliquis for OAC - Rate Control   . Prostate cancer ([place])   . Prostate disorder   . Sciatic pain    Chronic  . Sleep apnea    cpap  . Temporary cerebral vascular dysfunction 02/10/2015   Had negative work-up.  July, 2012.  No medication changes, had forgot them that day, got dehydrated.   . Urinary incontinence     Past Surgical History:  Procedure Laterality Date  . ABDOMINAL AORTIC ENDOVASCULAR STENT GRAFT Bilateral 05/18/2019   Procedure: ABDOMINAL AORTIC ENDOVASCULAR STENT GRAFT;  Surgeon: Algernon Huxley, MD;  Location: [facility] ORS;  Service: Vascular;  Laterality:  Bilateral;  . ANKLE SURGERY Left    ORIF   . Arch aortogram and carotid aortogram  06/02/2006   Dr Oneida Alar did surgery  . BASAL CELL CARCINOMA EXCISION  04/2016   Dermatology  . CARDIAC CATHETERIZATION  01/30/1998    (Dr. Linard Millers): Native LAD & mRCA CTO. 90% D1. Mod Cx. SVG-D1 CTO. SVG-RCA & SVG-OM along with LIMA-LAD patent;  LV NORMAL Fxn  . CAROTID ENDARTERECTOMY Left Oct. 29, 2007   Dr. Oneida Alar:  . CATARACT EXTRACTION W/PHACO Right 03/05/2017   Procedure: CATARACT EXTRACTION PHACO AND INTRAOCULAR LENS PLACEMENT (IOC);  Surgeon: Leandrew Koyanagi, MD;  Location: [facility] ORS;  Service: Ophthalmology;  Laterality: Right;  [place] 00:58.5 AP% 10.6 CDE 6.20 Fluid Pack lot # [id number]  . CATARACT EXTRACTION W/PHACO Left 04/15/2017   Procedure: CATARACT  EXTRACTION PHACO AND INTRAOCULAR LENS PLACEMENT ([place])  Left;  Surgeon: Leandrew Koyanagi, MD;  Location: [place];  Service: Ophthalmology;  Laterality: Left;  . COLONOSCOPY WITH PROPOFOL N/A 09/15/2017   Procedure: COLONOSCOPY WITH PROPOFOL;  Surgeon: Lucilla Lame, MD;  Location: [facility] ENDOSCOPY;  Service: Endoscopy;  Laterality: N/A;  . COLONOSCOPY WITH PROPOFOL N/A 10/26/2018   Procedure: COLONOSCOPY WITH PROPOFOL;  Surgeon: Lucilla Lame, MD;  Location: [facility] ENDOSCOPY;  Service: Endoscopy;  Laterality: N/A;  . CORONARY ARTERY BYPASS GRAFT  1998   LIMA-LAD, SVG-OM, SVG-RPDA, SVG-DIAG  . CPET / MET  12/2012   Mild chronotropic incompetence - read 82% of predicted; also reduced effort; peak VO2 15.7 / 75% (did not reach Max effort) -- suggested ischemic response in last 1.5 minutes of exercise. Normal pulmonary function on PFTs but poor response to  . CYSTOSCOPY W/ RETROGRADES Left 01/13/2020   Procedure: CYSTOSCOPY WITH RETROGRADE PYELOGRAM;  Surgeon: Billey Co, MD;  Location: [facility] ORS;  Service: Urology;  Laterality: Left;  . CYSTOSCOPY WITH STENT PLACEMENT Left 12/26/2019   Procedure: CYSTOSCOPY WITH STENT PLACEMENT;  Surgeon: Billey Co, MD;  Location: [facility] ORS;  Service: Urology;  Laterality: Left;  . CYSTOSCOPY/URETEROSCOPY/HOLMIUM LASER/STENT PLACEMENT Left 01/13/2020   Procedure: CYSTOSCOPY/URETEROSCOPY/HOLMIUM LASER/STENT EXCHANGE;  Surgeon: Billey Co, MD;  Location: [facility] ORS;  Service: Urology;  Laterality: Left;  . ENDOVASCULAR REPAIR/STENT GRAFT N/A 05/18/2019   Procedure: ENDOVASCULAR REPAIR/STENT GRAFT;  Surgeon: Algernon Huxley, MD;  Location: [facility] INVASIVE CV LAB;; EVAR - 23 mm prox, 12 cm distal & x 18 cm length Gore Excluder Endoprosthesis Main body (via RFA distal to lowest Renal A); 38m x 14 cm L Contralateral Limb for L Iliac - extended with 8 mm x 6cm LifeStar stent; no additional R Iliac PTA needed.   . ESOPHAGOGASTRODUODENOSCOPY (EGD) WITH PROPOFOL N/A  09/15/2017   Procedure: ESOPHAGOGASTRODUODENOSCOPY (EGD) WITH PROPOFOL;  Surgeon: WLucilla Lame MD;  [facility];  Service: Endoscopy;  Laterality: N/A;  . FRACTURE SURGERY Right 03/19/2015   wrist  . FRACTURE SURGERY Left   . HIATAL HERNIA REPAIR    . ILIAC ARTERY STENT  12/15/2005   PTA and direct stenting rgt and lft common iliac arteries  . KYPHOPLASTY N/A 04/05/2019   Procedure: KYPHOPLASTY T12, L3, L4 L5;  Surgeon: MHessie Knows MD;  Location: [facility] ORS;  Service: Orthopedics;  Laterality: N/A;  . NM GATED MYOVIEW ([place])  05/'19; 8/'20   a) [facility]: Low Risk - no ischemia or infarct.  EF > 65%. no RWMA;; b) ([facility]: Normal wall motion.  No ischemia or infarction.  . OPEN REDUCTION INTERNAL FIXATION (ORIF) DISTAL RADIAL FRACTURE Left 01/13/2019  Procedure: OPEN REDUCTION INTERNAL FIXATION (ORIF) DISTAL  RADIAL FRACTURE - LEFT - SLEEP APNEA;  Surgeon: Kennedy Bucker, MD;  Location: [facility] ORS;  Service: Orthopedics;  Laterality: Left;  . ORIF ELBOW FRACTURE Right 01/01/2017   Procedure: OPEN REDUCTION INTERNAL FIXATION (ORIF) ELBOW/OLECRANON FRACTURE;  Surgeon: Kennedy Bucker, MD;  Location: [facility] ORS;  Service: Orthopedics;  Laterality: Right;  . ORIF WRIST FRACTURE Right 03/19/2015   Procedure: OPEN REDUCTION INTERNAL FIXATION (ORIF) WRIST FRACTURE;  Surgeon: Kennedy Bucker, MD;  Location: [facility] ORS;  Service: Orthopedics;  Laterality: Right;  . SHOULDER ARTHROSCOPY Right   . THORACIC AORTA - CAROTID ANGIOGRAM  October 2007   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 TRANSTHORACIC ECHOCARDIOGRAM  February 2016   [facility]: Normal LV function. Dilated left atrium.  Krystal Clark ECHOCARDIOGRAM  04/2019   [facility] April 20, 2019): EF 55-60 %.  Mild LVH.  Relatively normal valves.    There were no vitals filed for this visit.   Subjective Assessment - 06/11/20 1104    Subjective  Patient presents with son for program today. Was compliant with the 2x/day over the weekend and reports no falls since last session.    Pertinent History Patient is a pleasant 86 year old male who presents to PT for his LSVT eval with his significant other. Patient has been seen by this therapist in the past for balance, back pain, and strength. History of hallucinations, kidney stones, kyphoplasty T12, L3, L4, L5 performed in August 2020 by Dr. Rosita Kea. MRI from [facility] dated 03/28/2019 report was reviewed today. Acute compression fractures are noted at T12, L3, and L4.PMH includes AAA, arthritis, atherosclerotic heart disease (s/p CABG), benign neoplasm of ascending/descending colon, COPD, CAD, dementia, dysrhythmia, falls, GERD, hemorrhoid, HOH, HTN, PAF, prostate cancer, osteoporosis, kidney stones, macular degeneration, Lewy Body Dementia.    Limitations Walking;Standing;House hold activities;Other (comment);Lifting;Sitting    How long can you sit comfortably? n/a    How long can you stand comfortably? needs a walker now, unsteady once standing    How long can you walk comfortably? uses a walker now; can walk in house.    Diagnostic tests MRI from [facility] dated 03/28/2019 report was reviewed today. Acute compression fractures are noted at T12, L3, and L4. He has facet arthropathy at the lower lumbar levels. At L3-4 he has moderate central stenosis.    Patient Stated Goals be more independent with ADLs and mobility    Currently in Pain? No/denies              Treatment:   Patient seen for LSVT Daily Session Maximal Daily Exercises for facilitation/coordination of movement Sustained movements are designed to rescale the amplitude of movement output for generalization to daily functional activities .Performed as follows for 1 set of 10 repetitions each multidirectional sustained movements  1) Floor to ceiling , cues to hold for 10 seconds, needs modeling for  correct positions , VC to reach out further and to reach up to the ceiling; 8x 2) Side to side multidirectional Repetitive movements performed in sitting and are designed to provide retraining effort needed for sustained muscle activation in tasks , cues to lean fwd and hold position x 10 counts: Patient does not move sideways well and is not able to transition or stretch out leg very far; 8x each side  3) Step and reach forward , cues for good knee flex,  cues to move UE and LE together, cues to rotate  arms up to pronate his forearms 4) Step and reach backwards , cues for BUE back, getting toe up and bending back knee 5) Step and reach sideways, cues to turn  head sideways, and turn head back to neutral, and to rotate arms and pronate forearms 6) Rock and reach forward/backward , cues to reach far fwd with UE's, patient not comfortable with feet apart , not rocking very far to get a good weight shift, not able to raise  heel or raise  toes much 7) Rock and reach sideways, cues for twist and look behind , only rotates side ways, unable to twist around or turn to look behind  functional component task with supervision 5 reps and simulated activities for: 1. Sit to stand x 5   needs cues to begin with UE up for correct start position 2. enter/exit room 5x. Step over line 10x each LE  3.putting on coat with punching into arm of coat 4.putting cones into/out of top shelf of cupboard to reach and take out 5x each arm 5. Large step with rollator and upright posture  x5 each LE  Patient performed with instruction, verbal cues, tactile cues of therapist: goal: increase tissue extensibility, promote proper posture, improve mobility   Ambulate 140 ft with cues for big step and foot clearance,  with improved foot clearance with contact cue of BIG. With rollator and close CGA       Pt presents with slowness of movements and fatigues with therapeutic exercises. Patient needs verbal, tactile and modeling cues  with LSVT BIG exercises. Patient demonstrates difficulty with remembering to hold the seated exercises and the start and finish positions. Patient tolerated all interventions well this date and will benefit from continued skilled PT interventions to improve strength and balance and decrease risk of falling                        PT Education - 06/11/20 1105    Education provided Yes    Education Details LSVT, carryover assignment, homework    Person(s) Educated Patient;Child(ren)    Methods Explanation;Demonstration;Tactile cues;Verbal cues    Comprehension Verbalized understanding;Returned demonstration;Verbal cues required;Tactile cues required            PT Short Term Goals - 06/04/20 1446      PT SHORT TERM GOAL #1   Title Patient will be independent in home exercise program to improve strength/mobility for better functional independence with ADLs.    Baseline 10/4: will start program tomorrow    Time 2    Period Weeks    Status New    Target Date 06/18/20             PT Long Term Goals - 06/04/20 1447      PT LONG TERM GOAL #1   Title Patient will increase FOTO score to equal to or greater than 52% to demonstrate statistically significant improvement in mobility and quality of life.    Baseline 10/4: 47.3%    Time 4    Period Weeks    Status New    Target Date 07/02/20      PT LONG TERM GOAL #2   Title Patient will demonstrate an improved Berg Balance Score of > 41/56 as to demonstrate improved balance with ADLs such as sitting/standing and transfer balance and reduced fall risk.    Baseline 10/4: 35/56    Time 4  Period Weeks    Status New    Target Date 07/02/20      PT LONG TERM GOAL #3   Title Client will be able to get up from soft surfaces, including couch and reclinerin his home confidently on his 1st trial 100% of the time to allow him to safely access all areas of his home.    Baseline 10/4: needs help to get up    Time 4     Period Weeks    Status New    Target Date 07/02/20      PT LONG TERM GOAL #4   Title Patient will improve his functional mobility be decreasing his cognitive TUG to < 15 seconds for improved mobility, stability, and safety with dual task to decrease fall risk.    Baseline 10/4: unable to perform cognitive aspect of test ( 29.19)    Time 4    Period Weeks    Status New    Target Date 07/02/20                 Plan - 06/11/20 1107    Clinical Impression Statement Patient accompanied with son who additionally was taught program for improved carryover at home. Patient is challenged with multitasking requiring breakdown of movements for optimal recruitment patterns and decreasing episodes of LOB. Patients carryover homework assignment is to put his shoes on and off five times today on his own. Pt educated throughout session about proper posture and technique with exercises. Improved exercise technique, movement at target joints, use of target muscles after min to mod verbal, visual, tactile cues. Min cueing needed to appropriately perform LSVT tasks with leg, hand, and head position. Decreased coordination demonstrated requiring consistent verbal cueing to correct form. Cognitive understanding of task was delayed.Continue to progress toward improved balance and reduced need for cues to sustain normalized amplitude with mobility and functional tasks.    Personal Factors and Comorbidities Age;Comorbidity 3+;Fitness;Past/Current Experience;Sex;Social Background;Time since onset of injury/illness/exacerbation;Transportation    Comorbidities AAA, arthritis, atherosclerotic heart disease, (s/p CABG), benign neoplasm of ascending/descending colon, COPD, CAD, dementia, dysrhythmia, frequent falls, GERD, hemorrhoid, HOH, HTN, PAF, prostate cancer.    Examination-Activity Limitations Bathing;Bend;Carry;Dressing;Continence;Stairs;Squat;Reach Overhead;Locomotion Level;Lift;Stand;Transfers;Toileting;Bed  Mobility    Examination-Participation Restrictions Church;Cleaning;Community Activity;Driving;Interpersonal Relationship;Laundry;Volunteer;Shop;Personal Finances;Meal Prep;Yard Work    [employer] Evolving/Moderate complexity    Rehab Potential Fair    PT Frequency 4x / week    PT Duration 4 weeks    PT Treatment/Interventions ADLs/Self Care Home Management;Aquatic Therapy;Cryotherapy;Moist Heat;Traction;Ultrasound;Therapeutic activities;Functional mobility training;Stair training;Gait training;DME Instruction;Therapeutic exercise;Balance training;Neuromuscular re-education;Patient/family education;Manual techniques;Passive range of motion;Energy conservation;Vestibular;Taping;Biofeedback;Electrical Stimulation;Iontophoresis 4mg /ml Dexamethasone;Dry needling;Visual/perceptual remediation/compensation;Cognitive remediation    PT Next Visit Plan LSVT    Consulted and Agree with Plan of Care Patient;Family member/caregiver    Family Member Consulted significant other           Patient will benefit from skilled therapeutic intervention in order to improve the following deficits and impairments:  Abnormal gait, Cardiopulmonary status limiting activity, Decreased activity tolerance, Decreased balance, Decreased knowledge of precautions, Decreased endurance, Decreased coordination, Decreased cognition, Decreased knowledge of use of DME, Decreased mobility, Difficulty walking, Decreased safety awareness, Decreased strength, Impaired flexibility, Impaired perceived functional ability, Impaired sensation, Postural dysfunction, Improper body mechanics, Pain, Impaired vision/preception, Decreased range of motion  Visit Diagnosis: Unsteadiness on feet  Other abnormalities of gait and mobility  Muscle weakness (generalized)     Problem List Patient Active Problem List   Diagnosis Date Noted  . LBD (Lewy body dementia) (  [place]) 05/14/2020  . Hematuria   . Acute kidney injury  superimposed on CKD ([place])   . Sepsis due to gram-negative UTI ([place]) 12/26/2019  . Left nephrolithiasis 12/26/2019  . Hydronephrosis, left 12/26/2019  . Status post abdominal aortic aneurysm (AAA) repair 04/15/2019  . Malignant neoplasm of descending colon ([place])   . Do not intubate, cardiopulmonary resuscitation (CPR)-only code status 10/08/2018  . Prostate cancer ([place]) 02/25/2018  . Prostatic cyst 01/15/2018  . 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 ([place]) 09/15/2017  . Blood in stool   . Abnormal feces   . Stricture and stenosis of esophagus   . Mild cognitive impairment 08/18/2017  . Senile purpura ([place]) 04/29/2017  . Anemia 04/29/2017  . Frequent falls 04/29/2017  . Simple chronic bronchitis ([place]) 12/17/2016  . Benign localized hyperplasia of prostate with urinary obstruction 07/15/2016  . Elevated PSA 07/15/2016  . Incomplete emptying of bladder 07/15/2016  . Urge incontinence 07/15/2016  . Hypothyroidism 04/25/2016  . Macular degeneration 04/25/2016  . Erectile dysfunction due to arterial insufficiency   . Ischemic heart disease with chronotropic incompetence   . CN (constipation) 02/10/2015  . DD (diverticular disease) 02/10/2015  . Weakness 02/10/2015  . Lichen planus 95/05/3266  . Restless leg 02/10/2015  . 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 B12 deficiency 02/10/2015  . COPD (chronic obstructive pulmonary disease) ([place]) 11/14/2014  . Post Inflammatory Lung Changes 11/14/2014  . Acute delirium 11/08/2014  . PAF (paroxysmal atrial fibrillation) (HCC) CHA2DS2-VASc = 4. AC = Eliquis   . Insomnia 12/09/2013  . OSA on CPAP   . Dyspnea on exertion - -essentially resolved with BB dose reduction & wgt loss 03/29/2013  . Overweight (BMI 25.0-29.9) -- Wgt back up 01/17/2013  . Atherosclerotic heart disease of native coronary artery without angina pectoris - s/p CABG, occluded SVG-D1; patent LIMA-LAD, SVG-OM, SVG- RCA   . Essential hypertension   . Hyperlipidemia with target LDL  less than 70   . Aorto-iliac disease ([place])   . BCC (basal cell carcinoma), eyelid 01/03/2013  . Allergic rhinitis 08/17/2009  . Adaptation reaction 05/28/2009  . Acid reflux 05/28/2009  . Arthritis, degenerative 05/28/2009  . 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 Carotid artery occlusion without infarction 06/01/2006  . PAD (peripheral artery disease) - bilateral common iliac stents 12/15/2005  . Atherosclerotic heart disease of artery bypass graft 09/01/1997   Janna Arch, PT, DPT   06/11/2020, 11:09 AM  [place] MAIN [facility] SERVICES [address] [place], [place], [address] Phone: [phone]   Fax:  [phone]  Name: [name] MRN: [id number] Date of Birth: 03/10/1934

## 2020-06-12 ENCOUNTER — Ambulatory Visit: Payer: Medicare Other

## 2020-06-12 ENCOUNTER — Ambulatory Visit: Payer: Medicare Other | Admitting: Speech Pathology

## 2020-06-12 DIAGNOSIS — R2681 Unsteadiness on feet: Secondary | ICD-10-CM | POA: Diagnosis not present

## 2020-06-12 DIAGNOSIS — M6281 Muscle weakness (generalized): Secondary | ICD-10-CM

## 2020-06-12 DIAGNOSIS — R2689 Other abnormalities of gait and mobility: Secondary | ICD-10-CM

## 2020-06-12 NOTE — Therapy (Signed)
[place] MAIN [facility] SERVICES [address] [place], [place], [address] Phone: [phone]   Fax:  [phone]  Physical Therapy Treatment  Patient Details  Name: [name] MRN: [id number] Date of Birth: 13-Nov-1933 Referring Provider (PT): Meeler, Sherren Kerns FNP   Encounter Date: 06/12/2020   PT End of Session - 06/12/20 1046    Visit Number 6    Number of Visits 17    Date for PT Re-Evaluation 07/02/20    Authorization Type LSVT eval 6/10 06/04/20    PT Start Time 1100    PT Stop Time 1158    PT Time Calculation (min) 58 min    Equipment Utilized During Treatment Gait belt    Activity Tolerance Patient tolerated treatment well;Patient limited by fatigue    Behavior During Therapy WFL for tasks assessed/performed           Past Medical History:  Diagnosis Date  . AAA (abdominal aortic aneurysm) ([place]) 05/2019   Relativley Rapid progression from June-Aug 2020 (4.6 - 5.4 cm): s/p EVAR (Dr. Lucky Cowboy [facility]): EVAR - 23 mm prox, 12 cm distal & x 18 cm length Gore Excluder Endoprosthesis Main body (via RFA distal to lowest Renal A); 56m x 14 cm L Contralateral Limb for L Iliac - extended with 8 mm x 6cm LifeStar stent (post-dilated with 7 mm DEB).  Additional R Iliac PTA not required.   . Arthritis   . Basal cell carcinoma of eyelid 01/03/2013   2019 - R side of Nose  . Benign neoplasm of ascending colon   . Benign neoplasm of descending colon   . Bilateral iliac artery stenosis ([place] 2007   Bilateral Iliac A stenting; extension of EVAR limbs into both Iliacs -- additional stent placed in L Iliac.  . Cancer ([place]    Skin cancer  . Colon polyps   . Complication of anesthesia    severe confusion agitation requiring hospital admission x 4 days  . Compression fracture of fourth lumbar vertebra ([place] 04/2019   - s/p Kyphoplasty; T12, L3-5 ([facility]  . COPD (chronic obstructive pulmonary disease) (HCC)    Centrilobular Emphysema  . Coronary  artery disease involving native coronary artery without angina pectoris 1998   - s/p CABG, occluded SVG-D1; patent LIMA-LAD, SVG-OM, SVG- RCA  . Dementia ([place]    memory loss - started noting late 2019  . Falls frequently    broke foot  . Fracture 2021   left foot  . GERD (gastroesophageal reflux disease)   . Hemorrhoid 02/10/2015  . History of hiatal hernia   . History of kidney stones   . HOH (hard of hearing)   . Hypertension   . Lewy body dementia ([place] 04/04/2020  . Macular degeneration disease   . Osteoporosis   . PAF (paroxysmal atrial fibrillation) ([place]    on Eliquis for OAC - Rate Control   . Prostate cancer ([place]   . Prostate disorder   . Sciatic pain    Chronic  . Sleep apnea    cpap  . Temporary cerebral vascular dysfunction 02/10/2015   Had negative work-up.  July, 2012.  No medication changes, had forgot them that day, got dehydrated.   . Urinary incontinence     Past Surgical History:  Procedure Laterality Date  . ABDOMINAL AORTIC ENDOVASCULAR STENT GRAFT Bilateral 05/18/2019   Procedure: ABDOMINAL AORTIC ENDOVASCULAR STENT GRAFT;  Surgeon: DAlgernon Huxley MD;  Location: [facility] ORS;  Service: Vascular;  Laterality:  Bilateral;  . ANKLE SURGERY Left    ORIF   . Arch aortogram and carotid aortogram  06/02/2006   Dr Oneida Alar did surgery  . BASAL CELL CARCINOMA EXCISION  04/2016   Dermatology  . CARDIAC CATHETERIZATION  01/30/1998    (Dr. Linard Millers): Native LAD & mRCA CTO. 90% D1. Mod Cx. SVG-D1 CTO. SVG-RCA & SVG-OM along with LIMA-LAD patent;  LV NORMAL Fxn  . CAROTID ENDARTERECTOMY Left Oct. 29, 2007   Dr. Oneida Alar:  . CATARACT EXTRACTION W/PHACO Right 03/05/2017   Procedure: CATARACT EXTRACTION PHACO AND INTRAOCULAR LENS PLACEMENT (IOC);  Surgeon: Leandrew Koyanagi, MD;  Location: [facility] ORS;  Service: Ophthalmology;  Laterality: Right;  [place] 00:58.5 AP% 10.6 CDE 6.20 Fluid Pack lot # [id number]  . CATARACT EXTRACTION W/PHACO Left 04/15/2017   Procedure: CATARACT  EXTRACTION PHACO AND INTRAOCULAR LENS PLACEMENT ([place])  Left;  Surgeon: Leandrew Koyanagi, MD;  Location: [place];  Service: Ophthalmology;  Laterality: Left;  . COLONOSCOPY WITH PROPOFOL N/A 09/15/2017   Procedure: COLONOSCOPY WITH PROPOFOL;  Surgeon: Lucilla Lame, MD;  Location[facility] ENDOSCOPY;  Service: Endoscopy;  Laterality: N/A;  . COLONOSCOPY WITH PROPOFOL N/A 10/26/2018   Procedure: COLONOSCOPY WITH PROPOFOL;  Surgeon: Lucilla Lame, MD;  Location: [facility] ENDOSCOPY;  Service: Endoscopy;  Laterality: N/A;  . CORONARY ARTERY BYPASS GRAFT  1998   LIMA-LAD, SVG-OM, SVG-RPDA, SVG-DIAG  . CPET / MET  12/2012   Mild chronotropic incompetence - read 82% of predicted; also reduced effort; peak VO2 15.7 / 75% (did not reach Max effort) -- suggested ischemic response in last 1.5 minutes of exercise. Normal pulmonary function on PFTs but poor response to  . CYSTOSCOPY W/ RETROGRADES Left 01/13/2020   Procedure: CYSTOSCOPY WITH RETROGRADE PYELOGRAM;  Surgeon: Billey Co, MD;  Location: [facility] ORS;  Service: Urology;  Laterality: Left;  . CYSTOSCOPY WITH STENT PLACEMENT Left 12/26/2019   Procedure: CYSTOSCOPY WITH STENT PLACEMENT;  Surgeon: Billey Co, MD;  Location: [facility] ORS;  Service: Urology;  Laterality: Left;  . CYSTOSCOPY/URETEROSCOPY/HOLMIUM LASER/STENT PLACEMENT Left 01/13/2020   Procedure: CYSTOSCOPY/URETEROSCOPY/HOLMIUM LASER/STENT EXCHANGE;  Surgeon: Billey Co, MD;  Location: [facility] ORS;  Service: Urology;  Laterality: Left;  . ENDOVASCULAR REPAIR/STENT GRAFT N/A 05/18/2019   Procedure: ENDOVASCULAR REPAIR/STENT GRAFT;  Surgeon: Algernon Huxley, MD;  Location: [facility] INVASIVE CV LAB;; EVAR - 23 mm prox, 12 cm distal & x 18 cm length Gore Excluder Endoprosthesis Main body (via RFA distal to lowest Renal A); 38m x 14 cm L Contralateral Limb for L Iliac - extended with 8 mm x 6cm LifeStar stent; no additional R Iliac PTA needed.   . ESOPHAGOGASTRODUODENOSCOPY (EGD) WITH PROPOFOL N/A  09/15/2017   Procedure: ESOPHAGOGASTRODUODENOSCOPY (EGD) WITH PROPOFOL;  Surgeon: WLucilla Lame MD;  [facility];  Service: Endoscopy;  Laterality: N/A;  . FRACTURE SURGERY Right 03/19/2015   wrist  . FRACTURE SURGERY Left   . HIATAL HERNIA REPAIR    . ILIAC ARTERY STENT  12/15/2005   PTA and direct stenting rgt and lft common iliac arteries  . KYPHOPLASTY N/A 04/05/2019   Procedure: KYPHOPLASTY T12, L3, L4 L5;  Surgeon: MHessie Knows MD;  Location: [facility] ORS;  Service: Orthopedics;  Laterality: N/A;  . NM GATED MYOVIEW ([place])  05/'19; 8/'20   a) [facility]: Low Risk - no ischemia or infarct.  EF > 65%. no RWMA;; b) ([facility]: Normal wall motion.  No ischemia or infarction.  . OPEN REDUCTION INTERNAL FIXATION (ORIF) DISTAL RADIAL FRACTURE Left 01/13/2019  Procedure: OPEN REDUCTION INTERNAL FIXATION (ORIF) DISTAL  RADIAL FRACTURE - LEFT - SLEEP APNEA;  Surgeon: Hessie Knows, MD;  Location: [facility] ORS;  Service: Orthopedics;  Laterality: Left;  . ORIF ELBOW FRACTURE Right 01/01/2017   Procedure: OPEN REDUCTION INTERNAL FIXATION (ORIF) ELBOW/OLECRANON FRACTURE;  Surgeon: Hessie Knows, MD;  Location: [facility] ORS;  Service: Orthopedics;  Laterality: Right;  . ORIF WRIST FRACTURE Right 03/19/2015   Procedure: OPEN REDUCTION INTERNAL FIXATION (ORIF) WRIST FRACTURE;  Surgeon: Hessie Knows, MD;  Location: [facility] ORS;  Service: Orthopedics;  Laterality: Right;  . SHOULDER ARTHROSCOPY Right   . THORACIC AORTA - CAROTID ANGIOGRAM  October 2007   Dr. 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 TRANSTHORACIC ECHOCARDIOGRAM  February 2016   [facility]: Normal LV function. Dilated left atrium.  Domingo Dimes ECHOCARDIOGRAM  04/2019   [facility] April 20, 2019): EF 55-60 %.  Mild LVH.  Relatively normal valves.    There were no vitals filed for this visit.   Subjective Assessment - 06/12/20 1316    Subjective  Patient presents with aide and is confused more than normal at this time.  Patient was compliant with his program yesterday.    Pertinent History Patient is a pleasant 86 year old male who presents to PT for his LSVT eval with his significant other. Patient has been seen by this therapist in the past for balance, back pain, and strength. History of hallucinations, kidney stones, kyphoplasty T12, L3, L4, L5 performed in August 2020 by Dr. Rudene Christians. [facility] from [facility] dated 03/28/2019 report was reviewed today. Acute compression fractures are noted at T12, L3, and L4.PMH includes AAA, arthritis, atherosclerotic heart disease (s/p CABG), benign neoplasm of ascending/descending colon, COPD, CAD, dementia, dysrhythmia, falls, GERD, hemorrhoid, HOH, HTN, PAF, prostate cancer, osteoporosis, kidney stones, macular degeneration, Lewy Body Dementia.    Limitations Walking;Standing;House hold activities;Other (comment);Lifting;Sitting    How long can you sit comfortably? n/a    How long can you stand comfortably? needs a walker now, unsteady once standing    How long can you walk comfortably? uses a walker now; can walk in house.    Diagnostic tests MRI from [facility] dated 03/28/2019 report was reviewed today. Acute compression fractures are noted at T12, L3, and L4. He has facet arthropathy at the lower lumbar levels. At L3-4 he has moderate central stenosis.    Patient Stated Goals be more independent with ADLs and mobility    Currently in Pain? No/denies                Treatment: Patient seen for LSVT Daily Session Maximal Daily Exercises for facilitation/coordination of movement Sustained movements are designed to rescale the amplitude of movement output for generalization to daily functional activities .Performed as follows for 1 set of 10 repetitions each multidirectional sustained movements  1) Floor to ceiling , cues to hold for 10 seconds, needs modeling for  correct positions, VC to reach out furtherand to reach up to the ceiling; 8x 2) Side to side multidirectional Repetitive movements performed in sittingand are designed to provide retraining effort needed for sustained muscle activation in tasks, cues to lean fwd and hold position x 10 counts: Patient does not movesideways welland is not able to transition or stretch out leg very far; 8x each side 3) Step and reach forward , cues for good knee flex, cues to move UE and LE together, cues  to rotate arms up to pronate his forearms 4) Step and reach backwards , cues for BUE back, getting toe up and bending back knee 5) Step and reach sideways, cues to turn head sideways, and turn head back to neutral, and to rotate arms and pronate forearms 6) Rock and reach forward/backward , cues to reach far fwd with UE's, patient not comfortable with feet apart, not rocking very far to get a good weight shift, not able to raise heel or raise toes much 7) Rock and reach sideways, cues for twist and look behind, only rotates side ways, unable to twist around or turn to look behind  functional component task with supervision 5 reps and simulated activities for: 1. Sit to standx 5needs cues to begin with UE up for correct start position 2.enter/exit room 5x. Step over line 10x each LE  3.putting on coat with punching into arm of coat 4.putting cones into/out of top shelf of cupboard to reach and take out 5x each arm 5.Large step with rollator and upright posture  x5 each LE Patient performed with instruction, verbal cues, tactile cues of therapist: goal:increase tissue extensibility, promote proper posture, improve mobility  Ambulate 500 ft with cues for big step and foot clearance,  with improved foot clearance with contact cue of BIG. With rollator and close CGA; 3 near LOB due to scuffing of feet. x2 trials    Pt presents with slowness of movements and fatigues with therapeutic exercises.  Patient needs verbal, tactile and modeling cues with LSVT BIG exercises. Patient demonstrates difficulty with remembering to hold the seated exercises and the start and finish positions. Patient tolerated all interventions well this date and will benefit from continued skilled PT interventions to improve strength and balance and decrease risk of falling                     PT Education - 06/12/20 1046    Education provided Yes    Education Details exercise technique, body mechanics, LSVT    Person(s) Educated Patient    Methods Explanation;Demonstration;Tactile cues;Verbal cues    Comprehension Verbalized understanding;Returned demonstration;Verbal cues required;Tactile cues required            PT Short Term Goals - 06/04/20 1446      PT SHORT TERM GOAL #1   Title Patient will be independent in home exercise program to improve strength/mobility for better functional independence with ADLs.    Baseline 10/4: will start program tomorrow    Time 2    Period Weeks    Status New    Target Date 06/18/20             PT Long Term Goals - 06/04/20 1447      PT LONG TERM GOAL #1   Title Patient will increase FOTO score to equal to or greater than 52% to demonstrate statistically significant improvement in mobility and quality of life.    Baseline 10/4: 47.3%    Time 4    Period Weeks    Status New    Target Date 07/02/20      PT LONG TERM GOAL #2   Title Patient will demonstrate an improved Berg Balance Score of > 41/56 as to demonstrate improved balance with ADLs such as sitting/standing and transfer balance and reduced fall risk.    Baseline 10/4: 35/56    Time 4    Period Weeks    Status New    Target Date 07/02/20  PT LONG TERM GOAL #3   Title Client will be able to get up from soft surfaces, including couch and reclinerin his home confidently on his 1st trial 100% of the time to allow him to safely access all areas of his home.    Baseline 10/4: needs  help to get up    Time 4    Period Weeks    Status New    Target Date 07/02/20      PT LONG TERM GOAL #4   Title Patient will improve his functional mobility be decreasing his cognitive TUG to < 15 seconds for improved mobility, stability, and safety with dual task to decrease fall risk.    Baseline 10/4: unable to perform cognitive aspect of test ( 29.19)    Time 4    Period Weeks    Status New    Target Date 07/02/20                 Plan - 06/12/20 1319    Clinical Impression Statement Patient is more confused this session, progressively worsening over the session with increasing suspicion and confusion by end of session with patient reporting he needs to call his girlfriend and can't in front of his aide because he records his conversations. Patient is able to tolerate increased ambulation this session despite confusion. Pt educated throughout session about proper posture and technique with exercises. Improved exercise technique, movement at target joints, use of target muscles after min to mod verbal, visual, tactile cues. Min cueing needed to appropriately perform LSVT tasks with leg, hand, and head position. Decreased coordination demonstrated requiring consistent verbal cueing to correct form. Cognitive understanding of task was delayed.Continue to progress toward improved balance and reduced need for cues to sustain normalized amplitude with mobility and functional tasks.    Personal Factors and Comorbidities Age;Comorbidity 3+;Fitness;Past/Current Experience;Sex;Social Background;Time since onset of injury/illness/exacerbation;Transportation    Comorbidities AAA, arthritis, atherosclerotic heart disease, (s/p CABG), benign neoplasm of ascending/descending colon, COPD, CAD, dementia, dysrhythmia, frequent falls, GERD, hemorrhoid, HOH, HTN, PAF, prostate cancer.    Examination-Activity Limitations Bathing;Bend;Carry;Dressing;Continence;Stairs;Squat;Reach Overhead;Locomotion  Level;Lift;Stand;Transfers;Toileting;Bed Mobility    Examination-Participation Restrictions Church;Cleaning;Community Activity;Driving;Interpersonal Relationship;Laundry;Volunteer;Shop;Personal Finances;Meal Prep;Yard Work    [employer] Evolving/Moderate complexity    Rehab Potential Fair    PT Frequency 4x / week    PT Duration 4 weeks    PT Treatment/Interventions ADLs/Self Care Home Management;Aquatic Therapy;Cryotherapy;Moist Heat;Traction;Ultrasound;Therapeutic activities;Functional mobility training;Stair training;Gait training;DME Instruction;Therapeutic exercise;Balance training;Neuromuscular re-education;Patient/family education;Manual techniques;Passive range of motion;Energy conservation;Vestibular;Taping;Biofeedback;Electrical Stimulation;Iontophoresis 50m/ml Dexamethasone;Dry needling;Visual/perceptual remediation/compensation;Cognitive remediation    PT Next Visit Plan LSVT    Consulted and Agree with Plan of Care Patient;Family member/caregiver    Family Member Consulted significant other           Patient will benefit from skilled therapeutic intervention in order to improve the following deficits and impairments:  Abnormal gait, Cardiopulmonary status limiting activity, Decreased activity tolerance, Decreased balance, Decreased knowledge of precautions, Decreased endurance, Decreased coordination, Decreased cognition, Decreased knowledge of use of DME, Decreased mobility, Difficulty walking, Decreased safety awareness, Decreased strength, Impaired flexibility, Impaired perceived functional ability, Impaired sensation, Postural dysfunction, Improper body mechanics, Pain, Impaired vision/preception, Decreased range of motion  Visit Diagnosis: Unsteadiness on feet  Other abnormalities of gait and mobility  Muscle weakness (generalized)     Problem List Patient Active Problem List   Diagnosis Date Noted  . LBD (Lewy body dementia) ([place] 05/14/2020   . Hematuria   . Acute kidney injury superimposed on CKD ([place]   .  Sepsis due to gram-negative UTI ([place]) 12/26/2019  . Left nephrolithiasis 12/26/2019  . Hydronephrosis, left 12/26/2019  . Status post abdominal aortic aneurysm (AAA) repair 04/15/2019  . Malignant neoplasm of descending colon ([place])   . Do not intubate, cardiopulmonary resuscitation (CPR)-only code status 10/08/2018  . Prostate cancer ([place]) 02/25/2018  . Prostatic cyst 01/15/2018  . 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 ([place]) 09/15/2017  . Blood in stool   . Abnormal feces   . Stricture and stenosis of esophagus   . Mild cognitive impairment 08/18/2017  . Senile purpura ([place]) 04/29/2017  . Anemia 04/29/2017  . Frequent falls 04/29/2017  . Simple chronic bronchitis ([place]) 12/17/2016  . Benign localized hyperplasia of prostate with urinary obstruction 07/15/2016  . Elevated PSA 07/15/2016  . Incomplete emptying of bladder 07/15/2016  . Urge incontinence 07/15/2016  . Hypothyroidism 04/25/2016  . Macular degeneration 04/25/2016  . Erectile dysfunction due to arterial insufficiency   . Ischemic heart disease with chronotropic incompetence   . CN (constipation) 02/10/2015  . DD (diverticular disease) 02/10/2015  . Weakness 02/10/2015  . Lichen planus 88/41/6606  . Restless leg 02/10/2015  . 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 B12 deficiency 02/10/2015  . COPD (chronic obstructive pulmonary disease) ([place]) 11/14/2014  . Post Inflammatory Lung Changes 11/14/2014  . Acute delirium 11/08/2014  . PAF (paroxysmal atrial fibrillation) (HCC) CHA2DS2-VASc = 4. AC = Eliquis   . Insomnia 12/09/2013  . OSA on CPAP   . Dyspnea on exertion - -essentially resolved with BB dose reduction & wgt loss 03/29/2013  . Overweight (BMI 25.0-29.9) -- Wgt back up 01/17/2013  . Atherosclerotic heart disease of native coronary artery without angina pectoris - s/p CABG, occluded SVG-D1; patent LIMA-LAD, SVG-OM, SVG- RCA   . Essential  hypertension   . Hyperlipidemia with target LDL less than 70   . Aorto-iliac disease ([place])   . BCC (basal cell carcinoma), eyelid 01/03/2013  . Allergic rhinitis 08/17/2009  . Adaptation reaction 05/28/2009  . Acid reflux 05/28/2009  . Arthritis, degenerative 05/28/2009  . 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 Carotid artery occlusion without infarction 06/01/2006  . PAD (peripheral artery disease) - bilateral common iliac stents 12/15/2005  . Atherosclerotic heart disease of artery bypass graft 09/01/1997    Janna Arch, PT, DPT   06/12/2020, 1:20 PM  [place] [facility] SERVICES [address] [place], [place], [address] Phone: [phone]   Fax:  [phone]  Name: [name] MRN: [id number] Date of Birth: 06-23-1934

## 2020-06-13 ENCOUNTER — Ambulatory Visit: Payer: Medicare Other

## 2020-06-13 ENCOUNTER — Encounter: Payer: 59 | Admitting: Speech Pathology

## 2020-06-13 ENCOUNTER — Other Ambulatory Visit: Payer: Medicare Other | Admitting: Adult Health Nurse Practitioner

## 2020-06-13 ENCOUNTER — Other Ambulatory Visit: Payer: Self-pay

## 2020-06-13 DIAGNOSIS — Z515 Encounter for palliative care: Secondary | ICD-10-CM

## 2020-06-13 DIAGNOSIS — M6281 Muscle weakness (generalized): Secondary | ICD-10-CM

## 2020-06-13 DIAGNOSIS — R2689 Other abnormalities of gait and mobility: Secondary | ICD-10-CM

## 2020-06-13 DIAGNOSIS — R2681 Unsteadiness on feet: Secondary | ICD-10-CM | POA: Diagnosis not present

## 2020-06-13 DIAGNOSIS — F0280 Dementia in other diseases classified elsewhere without behavioral disturbance: Secondary | ICD-10-CM

## 2020-06-13 NOTE — Therapy (Signed)
[place] MAIN [facility] SERVICES [address] [place], [place], [address] Phone: [phone]   Fax:  [phone]  Physical Therapy Treatment  Patient Details  Name: [name] MRN: [id number] Date of Birth: August 25, 1934 Referring Provider (PT): Meeler, Sherren Kerns FNP   Encounter Date: 06/13/2020   PT End of Session - 06/13/20 1112    Visit Number 7    Number of Visits 17    Date for PT Re-Evaluation 07/02/20    Authorization Type LSVT eval 7/10 06/04/20    PT Start Time 1000    PT Stop Time 1059    PT Time Calculation (min) 59 min    Equipment Utilized During Treatment Gait belt    Activity Tolerance Patient tolerated treatment well;Patient limited by fatigue    Behavior During Therapy WFL for tasks assessed/performed           Past Medical History:  Diagnosis Date  . AAA (abdominal aortic aneurysm) ([place]) 05/2019   Relativley Rapid progression from June-Aug 2020 (4.6 - 5.4 cm): s/p EVAR (Dr. Lucky Cowboy [facility]): EVAR - 23 mm prox, 12 cm distal & x 18 cm length Gore Excluder Endoprosthesis Main body (via RFA distal to lowest Renal A); 6mm x 14 cm L Contralateral Limb for L Iliac - extended with 8 mm x 6cm LifeStar stent (post-dilated with 7 mm DEB).  Additional R Iliac PTA not required.   . Arthritis   . Basal cell carcinoma of eyelid 01/03/2013   2019 - R side of Nose  . Benign neoplasm of ascending colon   . Benign neoplasm of descending colon   . Bilateral iliac artery stenosis ([place]) 2007   Bilateral Iliac A stenting; extension of EVAR limbs into both Iliacs -- additional stent placed in L Iliac.  . Cancer ([place])    Skin cancer  . Colon polyps   . Complication of anesthesia    severe confusion agitation requiring hospital admission x 4 days  . Compression fracture of fourth lumbar vertebra ([place]) 04/2019   - s/p Kyphoplasty; T12, L3-5 [facility])  . COPD (chronic obstructive pulmonary disease) (HCC)    Centrilobular Emphysema  . Coronary  artery disease involving native coronary artery without angina pectoris 1998   - s/p CABG, occluded SVG-D1; patent LIMA-LAD, SVG-OM, SVG- RCA  . Dementia ([place])    memory loss - started noting late 2019  . Falls frequently    broke foot  . Fracture 2021   left foot  . GERD (gastroesophageal reflux disease)   . Hemorrhoid 02/10/2015  . History of hiatal hernia   . History of kidney stones   . HOH (hard of hearing)   . Hypertension   . Lewy body dementia ([place]) 04/04/2020  . Macular degeneration disease   . Osteoporosis   . PAF (paroxysmal atrial fibrillation) ([place])    on Eliquis for OAC - Rate Control   . Prostate cancer ([place])   . Prostate disorder   . Sciatic pain    Chronic  . Sleep apnea    cpap  . Temporary cerebral vascular dysfunction 02/10/2015   Had negative work-up.  July, 2012.  No medication changes, had forgot them that day, got dehydrated.   . Urinary incontinence     Past Surgical History:  Procedure Laterality Date  . ABDOMINAL AORTIC ENDOVASCULAR STENT GRAFT Bilateral 05/18/2019   Procedure: ABDOMINAL AORTIC ENDOVASCULAR STENT GRAFT;  Surgeon: Algernon Huxley, MD;  Location: [facility] ORS;  Service: Vascular;  Laterality:  Bilateral;  . ANKLE SURGERY Left    ORIF   . Arch aortogram and carotid aortogram  06/02/2006   Dr Oneida Alar did surgery  . BASAL CELL CARCINOMA EXCISION  04/2016   Dermatology  . CARDIAC CATHETERIZATION  01/30/1998    (Dr. Linard Millers): Native LAD & mRCA CTO. 90% D1. Mod Cx. SVG-D1 CTO. SVG-RCA & SVG-OM along with LIMA-LAD patent;  LV NORMAL Fxn  . CAROTID ENDARTERECTOMY Left Oct. 29, 2007   Dr. Oneida Alar:  . CATARACT EXTRACTION W/PHACO Right 03/05/2017   Procedure: CATARACT EXTRACTION PHACO AND INTRAOCULAR LENS PLACEMENT (IOC);  Surgeon: Leandrew Koyanagi, MD;  Location: [facility] ORS;  Service: Ophthalmology;  Laterality: Right;  [place] 00:58.5 AP% 10.6 CDE 6.20 Fluid Pack lot # [id number]  . CATARACT EXTRACTION W/PHACO Left 04/15/2017   Procedure: CATARACT  EXTRACTION PHACO AND INTRAOCULAR LENS PLACEMENT ([place])  Left;  Surgeon: Leandrew Koyanagi, MD;  Location: [place];  Service: Ophthalmology;  Laterality: Left;  . COLONOSCOPY WITH PROPOFOL N/A 09/15/2017   Procedure: COLONOSCOPY WITH PROPOFOL;  Surgeon: Lucilla Lame, MD;  Location: [facility] ENDOSCOPY;  Service: Endoscopy;  Laterality: N/A;  . COLONOSCOPY WITH PROPOFOL N/A 10/26/2018   Procedure: COLONOSCOPY WITH PROPOFOL;  Surgeon: Lucilla Lame, MD;  Location: [facility] ENDOSCOPY;  Service: Endoscopy;  Laterality: N/A;  . CORONARY ARTERY BYPASS GRAFT  1998   LIMA-LAD, SVG-OM, SVG-RPDA, SVG-DIAG  . CPET / MET  12/2012   Mild chronotropic incompetence - read 82% of predicted; also reduced effort; peak VO2 15.7 / 75% (did not reach Max effort) -- suggested ischemic response in last 1.5 minutes of exercise. Normal pulmonary function on PFTs but poor response to  . CYSTOSCOPY W/ RETROGRADES Left 01/13/2020   Procedure: CYSTOSCOPY WITH RETROGRADE PYELOGRAM;  Surgeon: Billey Co, MD;  Location: [facility] ORS;  Service: Urology;  Laterality: Left;  . CYSTOSCOPY WITH STENT PLACEMENT Left 12/26/2019   Procedure: CYSTOSCOPY WITH STENT PLACEMENT;  Surgeon: Billey Co, MD;  Location: [facility] ORS;  Service: Urology;  Laterality: Left;  . CYSTOSCOPY/URETEROSCOPY/HOLMIUM LASER/STENT PLACEMENT Left 01/13/2020   Procedure: CYSTOSCOPY/URETEROSCOPY/HOLMIUM LASER/STENT EXCHANGE;  Surgeon: Billey Co, MD;  Location: [facility] ORS;  Service: Urology;  Laterality: Left;  . ENDOVASCULAR REPAIR/STENT GRAFT N/A 05/18/2019   Procedure: ENDOVASCULAR REPAIR/STENT GRAFT;  Surgeon: Algernon Huxley, MD;  Location: [facility] INVASIVE CV LAB;; EVAR - 23 mm prox, 12 cm distal & x 18 cm length Gore Excluder Endoprosthesis Main body (via RFA distal to lowest Renal A); 38m x 14 cm L Contralateral Limb for L Iliac - extended with 8 mm x 6cm LifeStar stent; no additional R Iliac PTA needed.   . ESOPHAGOGASTRODUODENOSCOPY (EGD) WITH PROPOFOL N/A  09/15/2017   Procedure: ESOPHAGOGASTRODUODENOSCOPY (EGD) WITH PROPOFOL;  Surgeon: WLucilla Lame MD;  [facility];  Service: Endoscopy;  Laterality: N/A;  . FRACTURE SURGERY Right 03/19/2015   wrist  . FRACTURE SURGERY Left   . HIATAL HERNIA REPAIR    . ILIAC ARTERY STENT  12/15/2005   PTA and direct stenting rgt and lft common iliac arteries  . KYPHOPLASTY N/A 04/05/2019   Procedure: KYPHOPLASTY T12, L3, L4 L5;  Surgeon: MHessie Knows MD;  Location: [facility] ORS;  Service: Orthopedics;  Laterality: N/A;  . NM GATED MYOVIEW ([place])  05/'19; 8/'20   a) [facility]: Low Risk - no ischemia or infarct.  EF > 65%. no RWMA;; b) ([facility]: Normal wall motion.  No ischemia or infarction.  . OPEN REDUCTION INTERNAL FIXATION (ORIF) DISTAL RADIAL FRACTURE Left 01/13/2019  Procedure: OPEN REDUCTION INTERNAL FIXATION (ORIF) DISTAL  RADIAL FRACTURE - LEFT - SLEEP APNEA;  Surgeon: Hessie Knows, MD;  Location: [facility] ORS;  Service: Orthopedics;  Laterality: Left;  . ORIF ELBOW FRACTURE Right 01/01/2017   Procedure: OPEN REDUCTION INTERNAL FIXATION (ORIF) ELBOW/OLECRANON FRACTURE;  Surgeon: Hessie Knows, MD;  Location: [facility] ORS;  Service: Orthopedics;  Laterality: Right;  . ORIF WRIST FRACTURE Right 03/19/2015   Procedure: OPEN REDUCTION INTERNAL FIXATION (ORIF) WRIST FRACTURE;  Surgeon: Hessie Knows, MD;  Location: [facility] ORS;  Service: Orthopedics;  Laterality: Right;  . SHOULDER ARTHROSCOPY Right   . THORACIC AORTA - CAROTID ANGIOGRAM  October 2007   Dr. 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 TRANSTHORACIC ECHOCARDIOGRAM  February 2016   [facility]: Normal LV function. Dilated left atrium.  Domingo Dimes ECHOCARDIOGRAM  04/2019   [facility] April 20, 2019): EF 55-60 %.  Mild LVH.  Relatively normal valves.    There were no vitals filed for this visit.   Subjective Assessment - 06/13/20 1110    Subjective  Patient reports fatigue throughout session. Is confused and does not recognize repeated exercises.    Pertinent History Patient is a pleasant 86 year old male who presents to PT for his LSVT eval with his significant other. Patient has been seen by this therapist in the past for balance, back pain, and strength. History of hallucinations, kidney stones, kyphoplasty T12, L3, L4, L5 performed in August 2020 by Dr. Rudene Christians. [facility] from [facility] dated 03/28/2019 report was reviewed today. Acute compression fractures are noted at T12, L3, and L4.PMH includes AAA, arthritis, atherosclerotic heart disease (s/p CABG), benign neoplasm of ascending/descending colon, COPD, CAD, dementia, dysrhythmia, falls, GERD, hemorrhoid, HOH, HTN, PAF, prostate cancer, osteoporosis, kidney stones, macular degeneration, Lewy Body Dementia.    Limitations Walking;Standing;House hold activities;Other (comment);Lifting;Sitting    How long can you sit comfortably? n/a    How long can you stand comfortably? needs a walker now, unsteady once standing    How long can you walk comfortably? uses a walker now; can walk in house.    Diagnostic tests MRI from [facility] dated 03/28/2019 report was reviewed today. Acute compression fractures are noted at T12, L3, and L4. He has facet arthropathy at the lower lumbar levels. At L3-4 he has moderate central stenosis.    Patient Stated Goals be more independent with ADLs and mobility    Currently in Pain? No/denies             Treatment: Patient seen for LSVT Daily Session Maximal Daily Exercises for facilitation/coordination of movement Sustained movements are designed to rescale the amplitude of movement output for generalization to daily functional activities .Performed as follows for 1 set of 10 repetitions each multidirectional sustained movements  1) Floor to ceiling , cues to hold for 10 seconds, needs modeling for correct positions, VC to reach  out furtherand to reach up to the ceiling; 8x 2) Side to side multidirectional Repetitive movements performed in sittingand are designed to provide retraining effort needed for sustained muscle activation in tasks, cues to lean fwd and hold position x 10 counts: Patient does not movesideways welland is not able to transition or stretch out leg very far; 8x each side; more challenging to R side 3) Step and reach forward , cues for good knee flex, cues to move UE and LE with tactile cues required to bring arm into  abduction and to rotate arms up to pronate his forearms 4) Step and reach backwards , cues for BUE back, getting toe up and bending back knee 5) Step and reach sideways, cues to turn head sideways, and turn head back to neutral, and to rotate arms and pronate forearms 6) Rock and reach forward/backward , cues to reach far fwd with UE's, patient not comfortable with feet apart, not rocking very far to get a good weight shift, not able to raise heel or raise toes much 7) Rock and reach sideways, cues for twist and look behind, only rotates side ways, unable to twist around or turn to look behind  functional component task with supervision 5 reps and simulated activities for: 1. Sit to standx5needs cues to begin with UE up for correct start position 2.enter/exitroom 5x. Step over line 10x each LE; follow PT for sequencing 3.putting on coat with punching into arm of coat, pull up to shoulder 10x  4.puttingconesinto/out of top shelf ofcupboard in kitchen to reach and take out5x each arm 5.Large step with rollatorand upright posturex5 each LE Patient performed with instruction, verbal cues, tactile cues of therapist: goal:increase tissue extensibility, promote proper posture, improve mobility Additional task performance: -take shoe on and off 5x each side, cues for full placement of shoe on foot -locking/unlocking rollator  Ambulate500 ft with cues for big step and  foot clearance, with improved foot clearance with contact cue of BIG. With rollator and close CGA; 3 near LOB due to scuffing of feet.    Pt presents with slowness of movements and fatigues with therapeutic exercises. Patient needs verbal, tactile and modeling cues with LSVT BIG exercises. Patient demonstrates difficulty with remembering to hold the seated exercises and the start and finish positions. Patient tolerated all interventions well this date and will benefit from continued skilled PT interventions to improve strength and balance and decrease risk of falling                            PT Education - 06/13/20 1111    Education provided Yes    Education Details exercise technique, body mechanics, LSVT, carryover    Person(s) Educated Patient    Methods Explanation;Demonstration;Tactile cues;Verbal cues    Comprehension Verbalized understanding;Returned demonstration;Verbal cues required;Tactile cues required            PT Short Term Goals - 06/04/20 1446      PT SHORT TERM GOAL #1   Title Patient will be independent in home exercise program to improve strength/mobility for better functional independence with ADLs.    Baseline 10/4: will start program tomorrow    Time 2    Period Weeks    Status New    Target Date 06/18/20             PT Long Term Goals - 06/04/20 1447      PT LONG TERM GOAL #1   Title Patient will increase FOTO score to equal to or greater than 52% to demonstrate statistically significant improvement in mobility and quality of life.    Baseline 10/4: 47.3%    Time 4    Period Weeks    Status New    Target Date 07/02/20      PT LONG TERM GOAL #2   Title Patient will demonstrate an improved Berg Balance Score of > 41/56 as to demonstrate improved balance with ADLs such as sitting/standing and transfer balance and reduced fall risk.  Baseline 10/4: 35/56    Time 4    Period Weeks    Status New    Target Date  07/02/20      PT LONG TERM GOAL #3   Title Client will be able to get up from soft surfaces, including couch and reclinerin his home confidently on his 1st trial 100% of the time to allow him to safely access all areas of his home.    Baseline 10/4: needs help to get up    Time 4    Period Weeks    Status New    Target Date 07/02/20      PT LONG TERM GOAL #4   Title Patient will improve his functional mobility be decreasing his cognitive TUG to < 15 seconds for improved mobility, stability, and safety with dual task to decrease fall risk.    Baseline 10/4: unable to perform cognitive aspect of test ( 29.19)    Time 4    Period Weeks    Status New    Target Date 07/02/20                 Plan - 06/13/20 1115    Clinical Impression Statement Due to patient's limited carryover between sessions for repetitive task performance and limited signs of recognition of the repeated tasks despite aide, son, and significant other being active in homework performance;  prognosis is guarded at this time. Patient requires visual, verbal, and tactile cueing for performance of tasks and continues to require assistance. Continue to progress toward improved balance and reduced need for cues to sustain normalized amplitude with mobility and functional tasks.    Personal Factors and Comorbidities Age;Comorbidity 3+;Fitness;Past/Current Experience;Sex;Social Background;Time since onset of injury/illness/exacerbation;Transportation    Comorbidities AAA, arthritis, atherosclerotic heart disease, (s/p CABG), benign neoplasm of ascending/descending colon, COPD, CAD, dementia, dysrhythmia, frequent falls, GERD, hemorrhoid, HOH, HTN, PAF, prostate cancer.    Examination-Activity Limitations Bathing;Bend;Carry;Dressing;Continence;Stairs;Squat;Reach Overhead;Locomotion Level;Lift;Stand;Transfers;Toileting;Bed Mobility    Examination-Participation Restrictions Church;Cleaning;Community Activity;Driving;Interpersonal  Relationship;Laundry;Volunteer;Shop;Personal Finances;Meal Prep;Yard Work    [employer] Evolving/Moderate complexity    Rehab Potential Fair    PT Frequency 4x / week    PT Duration 4 weeks    PT Treatment/Interventions ADLs/Self Care Home Management;Aquatic Therapy;Cryotherapy;Moist Heat;Traction;Ultrasound;Therapeutic activities;Functional mobility training;Stair training;Gait training;DME Instruction;Therapeutic exercise;Balance training;Neuromuscular re-education;Patient/family education;Manual techniques;Passive range of motion;Energy conservation;Vestibular;Taping;Biofeedback;Electrical Stimulation;Iontophoresis 4mg /ml Dexamethasone;Dry needling;Visual/perceptual remediation/compensation;Cognitive remediation    PT Next Visit Plan LSVT    Consulted and Agree with Plan of Care Patient;Family member/caregiver    Family Member Consulted significant other           Patient will benefit from skilled therapeutic intervention in order to improve the following deficits and impairments:  Abnormal gait, Cardiopulmonary status limiting activity, Decreased activity tolerance, Decreased balance, Decreased knowledge of precautions, Decreased endurance, Decreased coordination, Decreased cognition, Decreased knowledge of use of DME, Decreased mobility, Difficulty walking, Decreased safety awareness, Decreased strength, Impaired flexibility, Impaired perceived functional ability, Impaired sensation, Postural dysfunction, Improper body mechanics, Pain, Impaired vision/preception, Decreased range of motion  Visit Diagnosis: Unsteadiness on feet  Other abnormalities of gait and mobility  Muscle weakness (generalized)     Problem List Patient Active Problem List   Diagnosis Date Noted  . LBD (Lewy body dementia) ([place]) 05/14/2020  . Hematuria   . Acute kidney injury superimposed on CKD ([place])   . Sepsis due to gram-negative UTI ([place]) 12/26/2019  . Left nephrolithiasis  12/26/2019  . Hydronephrosis, left 12/26/2019  . Status post abdominal aortic aneurysm (AAA)  repair 04/15/2019  . Malignant neoplasm of descending colon ([place])   . Do not intubate, cardiopulmonary resuscitation (CPR)-only code status 10/08/2018  . Prostate cancer ([place]) 02/25/2018  . Prostatic cyst 01/15/2018  . 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 ([place]) 09/15/2017  . Blood in stool   . Abnormal feces   . Stricture and stenosis of esophagus   . Mild cognitive impairment 08/18/2017  . Senile purpura ([place]) 04/29/2017  . Anemia 04/29/2017  . Frequent falls 04/29/2017  . Simple chronic bronchitis ([place]) 12/17/2016  . Benign localized hyperplasia of prostate with urinary obstruction 07/15/2016  . Elevated PSA 07/15/2016  . Incomplete emptying of bladder 07/15/2016  . Urge incontinence 07/15/2016  . Hypothyroidism 04/25/2016  . Macular degeneration 04/25/2016  . Erectile dysfunction due to arterial insufficiency   . Ischemic heart disease with chronotropic incompetence   . CN (constipation) 02/10/2015  . DD (diverticular disease) 02/10/2015  . Weakness 02/10/2015  . Lichen planus 27/61/4709  . Restless leg 02/10/2015  . 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 B12 deficiency 02/10/2015  . COPD (chronic obstructive pulmonary disease) ([place]) 11/14/2014  . Post Inflammatory Lung Changes 11/14/2014  . Acute delirium 11/08/2014  . PAF (paroxysmal atrial fibrillation) (HCC) CHA2DS2-VASc = 4. AC = Eliquis   . Insomnia 12/09/2013  . OSA on CPAP   . Dyspnea on exertion - -essentially resolved with BB dose reduction & wgt loss 03/29/2013  . Overweight (BMI 25.0-29.9) -- Wgt back up 01/17/2013  . Atherosclerotic heart disease of native coronary artery without angina pectoris - s/p CABG, occluded SVG-D1; patent LIMA-LAD, SVG-OM, SVG- RCA   . Essential hypertension   . Hyperlipidemia with target LDL less than 70   . Aorto-iliac disease ([place])   . BCC (basal cell carcinoma), eyelid 01/03/2013  .  Allergic rhinitis 08/17/2009  . Adaptation reaction 05/28/2009  . Acid reflux 05/28/2009  . Arthritis, degenerative 05/28/2009  . 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 Carotid artery occlusion without infarction 06/01/2006  . PAD (peripheral artery disease) - bilateral common iliac stents 12/15/2005  . Atherosclerotic heart disease of artery bypass graft 09/01/1997   Janna Arch, PT, DPT   06/13/2020, 11:21 AM  [place] MAIN [facility] SERVICES [address] [place], [place], [address] Phone: [phone]   Fax:  [phone]  Name: KIERRE HINTZ MRN: [id number] Date of Birth: [birth year]

## 2020-06-13 NOTE — Progress Notes (Addendum)
[place] Consult Note Telephone: [phone]  Fax: [phone]  PATIENT NAME: [name] DOB: 1934-04-20 MRN: [id number]  PRIMARY CARE PROVIDER:   [name], MD  REFERRING PROVIDER:  Dr. Jennings Books  RESPONSIBLE PARTY:   Maricus Tanzi, son [phone] Email [email or web address] [name], SO C: [phone] H: [phone]    RECOMMENDATIONS and PLAN:  1.  Advanced care planning.  Patient is DNR.  Left MOST form with family to review together.  Emailed healthcare power of attorney form to son.  2.  Dementia. FAST 6B.  Patient does have frequent falls.  Family make sure or he wears gait belt when ambulating through the house and someone is with him.  Does use walker or cane when going outside the home.  Patient requires assistance with bathing and dressing.  Patient is continent of bowel and bladder.  Patient is currently working with outpatient PT and will be starting occupational therapy to help with things like buttons and zippers.  Patient does get more confused and forgetful in the evening.  Patient was having hallucinations at night.  Patient gets good relief of this with Seroquel Seroquel 12.5 mg nightly and melatonin 3 mg nightly.  Continue PT/OT as ordered  3.  Nutritional status.  Family does state his baseline weight was 234 pounds.  In April of this year he weighed 196 pounds and currently weighs approximately 178 with BMI of 25.66.  Family does state that his appetite is a little bit better and he eats well but he does not eat as much as what he used to.  States that he only eats about 50% of what he used to eat.  Continue to monitor weight for any continued weight loss.  4.  Support.  Between family and caregivers patient has 24/7 care in the home.  Goal is to keep him safe at home.  Son inquiring about adult daycare's.  Will reach out to social worker for availability of any adult daycare's in the  area.  Patient denies pain, fever, S OB, cough, dizziness, N/V/D, constipation, dysuria, hematuria.  Palliative will continue to monitor for symptom management/decline and make recommendations as needed.  Next appointment is in 6 weeks.  Family encouraged to call with any questions or concerns.  I spent 80 minutes providing this consultation,  from 2:00 to 3:20 including time spent with patient/family, chart review, provider coordination, documentation. More than 50% of the time in this consultation was spent coordinating communication.   HISTORY OF PRESENT ILLNESS:  [name] is a 86 y.o. 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 fib, CAD status post CABG x4, OSA on CPAP, COPD, HTN. Palliative Care was asked to help address goals of care.  Patient was in hospital 4/25 through 12/29/2019 due to abdominal pain and found to have left renal colic and left ureteral nephrolithiasis and stent placement was performed on 12/26/2019.  On 01/13/2020 patient underwent left ureteroscopic with laser lithotripsy and stent placement.  Patient has frequent falls and was seen in the ED on 01/01/2020 after a fall in the bathroom and found to have nondisplaced comminuted fracture of the fifth metatarsal shaft on left foot and this was splinted.  Patient was also seen in the ED on 04/04/2020 after a fall in which he hit his head on the wall with no acute injuries found.  Patient was seen in ED on 02/13/2020 for right jaw  swelling and was started on Augmentin for sialodenitis.  Patient was seen in ED on 04/11/2020 after obtaining a skin tear to left forearm post fall this was dressed and no suturing was required.  He was also seen again in the ED on 04/15/2020 when this wound became infected and was started on back a tracing ointment.  CODE STATUS: DNR  PPS: 50% HOSPICE ELIGIBILITY/DIAGNOSIS: TBD  PHYSICAL EXAM:  BP 148/98 HR 66 O2 97% on room air General: NAD,  frail appearing Cardiovascular: regular rate and rhythm Pulmonary: Lung sounds clear; normal respiratory effort Abdomen: soft, nontender, + bowel sounds GU: no suprapubic tenderness Extremities: no edema, no joint deformities Skin: no rashes on exposed skin Neurological: Weakness; A&O to person and place   PAST MEDICAL HISTORY:  Past Medical History:  Diagnosis Date  . AAA (abdominal aortic aneurysm) ([place]) 05/2019   Relativley Rapid progression from June-Aug 2020 (4.6 - 5.4 cm): s/p EVAR (Dr. [facility]): EVAR - 23 mm prox, 12 cm distal & x 18 cm length Gore Excluder Endoprosthesis Main body (via RFA distal to lowest Renal A); 92mm x 14 cm L Contralateral Limb for L Iliac - extended with 8 mm x 6cm LifeStar stent (post-dilated with 7 mm DEB).  Additional R Iliac PTA not required.   . Arthritis   . Basal cell carcinoma of eyelid 01/03/2013   2019 - R side of Nose  . Benign neoplasm of ascending colon   . Benign neoplasm of descending colon   . Bilateral iliac artery stenosis ([place]) 2007   Bilateral Iliac A stenting; extension of EVAR limbs into both Iliacs -- additional stent placed in L Iliac.  . Cancer ([place])    Skin cancer  . Colon polyps   . Complication of anesthesia    severe confusion agitation requiring hospital admission x 4 days  . Compression fracture of fourth lumbar vertebra ([place]) 04/2019   - s/p Kyphoplasty; T12, L3-5 [facility]  . COPD (chronic obstructive pulmonary disease) ([facility])    Centrilobular Emphysema  . Coronary artery disease involving native coronary artery without angina pectoris 1998   - s/p CABG, occluded SVG-D1; patent LIMA-LAD, SVG-OM, SVG- RCA  . Dementia ([place])    memory loss - started noting late 2019  . Falls frequently    broke foot  . Fracture 2021   left foot  . GERD (gastroesophageal reflux disease)   . Hemorrhoid 02/10/2015  . History of hiatal hernia   . History of kidney stones   . HOH (hard of hearing)   . Hypertension   . Lewy body  dementia ([place]) 04/04/2020  . Macular degeneration disease   . Osteoporosis   . PAF (paroxysmal atrial fibrillation) ([place])    on Eliquis for OAC - Rate Control   . Prostate cancer ([place])   . Prostate disorder   . Sciatic pain    Chronic  . Sleep apnea    cpap  . Temporary cerebral vascular dysfunction 02/10/2015   Had negative work-up.  July, 2012.  No medication changes, had forgot them that day, got dehydrated.   . Urinary incontinence     SOCIAL HX:  Social History   Tobacco Use  . Smoking status: Former Smoker    Types: Cigarettes    Quit date: 08/04/1997    Years since quitting: 22.8  . Smokeless tobacco: Never Used  Substance Use Topics  . Alcohol use: No    ALLERGIES:  Allergies  Allergen Reactions  . Clonazepam Other (See  Comments)    Altered mental status  . Codeine Other (See Comments)    Altered mental status.  . Ropinirole Swelling    Other reaction(s): Hallucination  . Hydrocodone     confusion  . Niacin And Related Other (See Comments)    Flushing of skin  . Oxycodone Other (See Comments)    confusion confusion  . Tramadol     Other reaction(s): Hallucination     PERTINENT MEDICATIONS:  Outpatient Encounter Medications as of 06/13/2020  Medication Sig  . acetaminophen (TYLENOL) 500 MG tablet Take 500-1,000 mg by mouth every 6 (six) hours as needed for mild pain or moderate pain.   . Ascorbic Acid (VITAMIN C) 1000 MG tablet Take 1,000 mg by mouth daily.  . Cyanocobalamin (VITAMIN B-12) 500 MCG SUBL Place 500 mcg under the tongue daily.   [name] ELIQUIS 5 MG TABS tablet TAKE 1 TABLET BY MOUTH TWICE DAILY  . ezetimibe (ZETIA) 10 MG tablet TAKE 1 TABLET BY MOUTH DAILY  . finasteride (PROSCAR) 5 MG tablet TAKE ONE TABLET BY MOUTH EVERY DAY  . fluticasone (FLONASE) 50 MCG/ACT nasal spray PLACE TWO SPRAYS IN BOTH NOSRTILS EVERY DAY AS NEEDED FOR ALLERGIES  . lisinopril (ZESTRIL) 2.5 MG tablet Take 1 tablet (2.5 mg total) by mouth daily.  . Magnesium Oxide 500  MG TABS Take 500 mg by mouth every evening.  . melatonin 3 MG TABS tablet Take 0.5 tablets (1.5 mg total) by mouth at bedtime.  . metoprolol succinate (TOPROL-XL) 50 MG 24 hr tablet TAKE ONE TABLET BY MOUTH EVERY DAY TAKE WITH OR IMMEDIATELY FOLLOWING A MEAL  . Multiple Vitamins-Minerals (PRESERVISION AREDS 2 PO) Take 1 tablet by mouth 2 (two) times daily.   [name] MYRBETRIQ 50 MG TB24 tablet TAKE ONE TABLET BY MOUTH EVERY DAY  . nitroGLYCERIN (NITROSTAT) 0.4 MG SL tablet Place 1 tablet (0.4 mg total) under the tongue every 5 (five) minutes as needed for chest pain.  . pantoprazole (PROTONIX) 40 MG tablet TAKE ONE TABLET BY MOUTH EVERY DAY  . QUEtiapine (SEROQUEL) 25 MG tablet Take 12.5 mg by mouth.   . rosuvastatin (CRESTOR) 20 MG tablet TAKE ONE TABLET BY MOUTH EVERY DAY  . umeclidinium-vilanterol (ANORO ELLIPTA) 62.5-25 MCG/INH AEPB Inhale 1 puff into the lungs daily.    No facility-administered encounter medications on file as of 06/13/2020.     Marit Goodwill Jenetta Downer, NP

## 2020-06-14 ENCOUNTER — Ambulatory Visit: Payer: Medicare Other

## 2020-06-14 ENCOUNTER — Encounter: Payer: 59 | Admitting: Speech Pathology

## 2020-06-14 DIAGNOSIS — R2689 Other abnormalities of gait and mobility: Secondary | ICD-10-CM

## 2020-06-14 DIAGNOSIS — R2681 Unsteadiness on feet: Secondary | ICD-10-CM | POA: Diagnosis not present

## 2020-06-14 DIAGNOSIS — M6281 Muscle weakness (generalized): Secondary | ICD-10-CM

## 2020-06-14 NOTE — Therapy (Signed)
[place] MAIN [facility] SERVICES [address] [place], [place], [address] Phone: [phone]   Fax:  [phone]  Physical Therapy Treatment  Patient Details  Name: [name] MRN: [id number] Date of Birth: Apr 07, 1934 Referring Provider (PT): Meeler, Sherren Kerns FNP   Encounter Date: 06/14/2020   PT End of Session - 06/14/20 1049    Visit Number 8    Number of Visits 17    Date for PT Re-Evaluation 07/02/20    Authorization Type LSVT eval 8/10 06/04/20    PT Start Time 1100    PT Stop Time 1159    PT Time Calculation (min) 59 min    Equipment Utilized During Treatment Gait belt    Activity Tolerance Patient tolerated treatment well;Patient limited by fatigue;Other (comment)    Behavior During Therapy WFL for tasks assessed/performed           Past Medical History:  Diagnosis Date  . AAA (abdominal aortic aneurysm) ([place]) 05/2019   Relativley Rapid progression from June-Aug 2020 (4.6 - 5.4 cm): s/p EVAR (Dr. Lucky Cowboy [facility]): EVAR - 23 mm prox, 12 cm distal & x 18 cm length Gore Excluder Endoprosthesis Main body (via RFA distal to lowest Renal A); 46m x 14 cm L Contralateral Limb for L Iliac - extended with 8 mm x 6cm LifeStar stent (post-dilated with 7 mm DEB).  Additional R Iliac PTA not required.   . Arthritis   . Basal cell carcinoma of eyelid 01/03/2013   2019 - R side of Nose  . Benign neoplasm of ascending colon   . Benign neoplasm of descending colon   . Bilateral iliac artery stenosis ([place] 2007   Bilateral Iliac A stenting; extension of EVAR limbs into both Iliacs -- additional stent placed in L Iliac.  . Cancer ([place]    Skin cancer  . Colon polyps   . Complication of anesthesia    severe confusion agitation requiring hospital admission x 4 days  . Compression fracture of fourth lumbar vertebra ([place] 04/2019   - s/p Kyphoplasty; T12, L3-5 ([facility]  . COPD (chronic obstructive pulmonary disease) (HCC)    Centrilobular  Emphysema  . Coronary artery disease involving native coronary artery without angina pectoris 1998   - s/p CABG, occluded SVG-D1; patent LIMA-LAD, SVG-OM, SVG- RCA  . Dementia ([place]    memory loss - started noting late 2019  . Falls frequently    broke foot  . Fracture 2021   left foot  . GERD (gastroesophageal reflux disease)   . Hemorrhoid 02/10/2015  . History of hiatal hernia   . History of kidney stones   . HOH (hard of hearing)   . Hypertension   . Lewy body dementia ([place] 04/04/2020  . Macular degeneration disease   . Osteoporosis   . PAF (paroxysmal atrial fibrillation) ([place]    on Eliquis for OAC - Rate Control   . Prostate cancer ([place]   . Prostate disorder   . Sciatic pain    Chronic  . Sleep apnea    cpap  . Temporary cerebral vascular dysfunction 02/10/2015   Had negative work-up.  July, 2012.  No medication changes, had forgot them that day, got dehydrated.   . Urinary incontinence     Past Surgical History:  Procedure Laterality Date  . ABDOMINAL AORTIC ENDOVASCULAR STENT GRAFT Bilateral 05/18/2019   Procedure: ABDOMINAL AORTIC ENDOVASCULAR STENT GRAFT;  Surgeon: DAlgernon Huxley MD;  Location: [facility] ORS;  Service: Vascular;  Laterality: Bilateral;  . ANKLE SURGERY Left    ORIF   . Arch aortogram and carotid aortogram  06/02/2006   Dr Oneida Alar did surgery  . BASAL CELL CARCINOMA EXCISION  04/2016   Dermatology  . CARDIAC CATHETERIZATION  01/30/1998    (Dr. Linard Millers): Native LAD & mRCA CTO. 90% D1. Mod Cx. SVG-D1 CTO. SVG-RCA & SVG-OM along with LIMA-LAD patent;  LV NORMAL Fxn  . CAROTID ENDARTERECTOMY Left Oct. 29, 2007   Dr. Oneida Alar:  . CATARACT EXTRACTION W/PHACO Right 03/05/2017   Procedure: CATARACT EXTRACTION PHACO AND INTRAOCULAR LENS PLACEMENT (IOC);  Surgeon: Leandrew Koyanagi, MD;  Location: [facility] ORS;  Service: Ophthalmology;  Laterality: Right;  [place] 00:58.5 AP% 10.6 CDE 6.20 Fluid Pack lot # [id number]  . CATARACT EXTRACTION W/PHACO Left 04/15/2017    Procedure: CATARACT EXTRACTION PHACO AND INTRAOCULAR LENS PLACEMENT ([place])  Left;  Surgeon: Leandrew Koyanagi, MD;  Location: [place];  Service: Ophthalmology;  Laterality: Left;  . COLONOSCOPY WITH PROPOFOL N/A 09/15/2017   Procedure: COLONOSCOPY WITH PROPOFOL;  Surgeon: Lucilla Lame, MD;  Location: [facility] ENDOSCOPY;  Service: Endoscopy;  Laterality: N/A;  . COLONOSCOPY WITH PROPOFOL N/A 10/26/2018   Procedure: COLONOSCOPY WITH PROPOFOL;  Surgeon: Lucilla Lame, MD;  Location: [facility] ENDOSCOPY;  Service: Endoscopy;  Laterality: N/A;  . CORONARY ARTERY BYPASS GRAFT  1998   LIMA-LAD, SVG-OM, SVG-RPDA, SVG-DIAG  . CPET / MET  12/2012   Mild chronotropic incompetence - read 82% of predicted; also reduced effort; peak VO2 15.7 / 75% (did not reach Max effort) -- suggested ischemic response in last 1.5 minutes of exercise. Normal pulmonary function on PFTs but poor response to  . CYSTOSCOPY W/ RETROGRADES Left 01/13/2020   Procedure: CYSTOSCOPY WITH RETROGRADE PYELOGRAM;  Surgeon: Billey Co, MD;  Location: [facility] ORS;  Service: Urology;  Laterality: Left;  . CYSTOSCOPY WITH STENT PLACEMENT Left 12/26/2019   Procedure: CYSTOSCOPY WITH STENT PLACEMENT;  Surgeon: Billey Co, MD;  Location: [facility] ORS;  Service: Urology;  Laterality: Left;  . CYSTOSCOPY/URETEROSCOPY/HOLMIUM LASER/STENT PLACEMENT Left 01/13/2020   Procedure: CYSTOSCOPY/URETEROSCOPY/HOLMIUM LASER/STENT EXCHANGE;  Surgeon: Billey Co, MD;  Location: [facility] ORS;  Service: Urology;  Laterality: Left;  . ENDOVASCULAR REPAIR/STENT GRAFT N/A 05/18/2019   Procedure: ENDOVASCULAR REPAIR/STENT GRAFT;  Surgeon: Algernon Huxley, MD;  Location: [facility] INVASIVE CV LAB;; EVAR - 23 mm prox, 12 cm distal & x 18 cm length Gore Excluder Endoprosthesis Main body (via RFA distal to lowest Renal A); 50m x 14 cm L Contralateral Limb for L Iliac - extended with 8 mm x 6cm LifeStar stent; no additional R Iliac PTA needed.   . ESOPHAGOGASTRODUODENOSCOPY  (EGD) WITH PROPOFOL N/A 09/15/2017   Procedure: ESOPHAGOGASTRODUODENOSCOPY (EGD) WITH PROPOFOL;  Surgeon: WLucilla Lame MD;  [facility];  Service: Endoscopy;  Laterality: N/A;  . FRACTURE SURGERY Right 03/19/2015   wrist  . FRACTURE SURGERY Left   . HIATAL HERNIA REPAIR    . ILIAC ARTERY STENT  12/15/2005   PTA and direct stenting rgt and lft common iliac arteries  . KYPHOPLASTY N/A 04/05/2019   Procedure: KYPHOPLASTY T12, L3, L4 L5;  Surgeon: MHessie Knows MD;  Location: [facility] ORS;  Service: Orthopedics;  Laterality: N/A;  . NM GATED MYOVIEW ([place])  05/'19; 8/'20   a) [facility]: Low Risk - no ischemia or infarct.  EF > 65%. no RWMA;; b) ([facility]: Normal wall motion.  No ischemia or infarction.  . OPEN REDUCTION INTERNAL FIXATION (ORIF) DISTAL RADIAL FRACTURE Left  01/13/2019   Procedure: OPEN REDUCTION INTERNAL FIXATION (ORIF) DISTAL  RADIAL FRACTURE - LEFT - SLEEP APNEA;  Surgeon: Hessie Knows, MD;  Location: [facility] ORS;  Service: Orthopedics;  Laterality: Left;  . ORIF ELBOW FRACTURE Right 01/01/2017   Procedure: OPEN REDUCTION INTERNAL FIXATION (ORIF) ELBOW/OLECRANON FRACTURE;  Surgeon: Hessie Knows, MD;  Location: [facility] ORS;  Service: Orthopedics;  Laterality: Right;  . ORIF WRIST FRACTURE Right 03/19/2015   Procedure: OPEN REDUCTION INTERNAL FIXATION (ORIF) WRIST FRACTURE;  Surgeon: Hessie Knows, MD;  Location: [facility] ORS;  Service: Orthopedics;  Laterality: Right;  . SHOULDER ARTHROSCOPY Right   . THORACIC AORTA - CAROTID ANGIOGRAM  October 2007   Dr. 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 TRANSTHORACIC ECHOCARDIOGRAM  February 2016   [facility]: Normal LV function. Dilated left atrium.  Domingo Dimes ECHOCARDIOGRAM  04/2019   [facility] April 20, 2019): EF 55-60 %.  Mild LVH.  Relatively normal valves.    There were no vitals filed for this visit.   Subjective Assessment -  06/14/20 1048    Subjective Patient met with palliative nursing yesterday, is DNR.  No falls since yesterday    Pertinent History Patient is a pleasant 86 year old male who presents to PT for his LSVT eval with his significant other. Patient has been seen by this therapist in the past for balance, back pain, and strength. History of hallucinations, kidney stones, kyphoplasty T12, L3, L4, L5 performed in August 2020 by Dr. Rudene Christians. MRI from [facility] dated 03/28/2019 report was reviewed today. Acute compression fractures are noted at T12, L3, and L4.PMH includes AAA, arthritis, atherosclerotic heart disease (s/p CABG), benign neoplasm of ascending/descending colon, COPD, CAD, dementia, dysrhythmia, falls, GERD, hemorrhoid, HOH, HTN, PAF, prostate cancer, osteoporosis, kidney stones, macular degeneration, Lewy Body Dementia.    Limitations Walking;Standing;House hold activities;Other (comment);Lifting;Sitting    How long can you sit comfortably? n/a    How long can you stand comfortably? needs a walker now, unsteady once standing    How long can you walk comfortably? uses a walker now; can walk in house.    Diagnostic tests MRI from [facility] dated 03/28/2019 report was reviewed today. Acute compression fractures are noted at T12, L3, and L4. He has facet arthropathy at the lower lumbar levels. At L3-4 he has moderate central stenosis.    Patient Stated Goals be more independent with ADLs and mobility    Currently in Pain? No/denies                Treatment: Patient seen for LSVT Daily Session Maximal Daily Exercises for facilitation/coordination of movement Sustained movements are designed to rescale the amplitude of movement output for generalization to daily functional activities .Performed as follows for 1 set of 10 repetitions each multidirectional sustained movements  1) Floor to ceiling , cues to hold for 10 seconds, needs modeling for correct  positions, VC to reach out furtherand to reach up to the ceiling; 8x 2) Side to side multidirectional Repetitive movements performed in sittingand are designed to provide retraining effort needed for sustained muscle activation in tasks, cues to lean fwd and hold position x 10 counts: Patient does not movesideways welland is not able to transition or stretch out leg very far; 8x each side; more challenging to R side 3) Step and reach forward , cues for good knee flex, cues to move UE and LE with tactile  cues required to bring arm into abduction and to rotate arms up to pronate his forearms 4) Step and reach backwards , cues for BUE back, getting toe up and bending back knee 5) Step and reach sideways, cues to turn head sideways, and turn head back to neutral, and to rotate arms and pronate forearms 6) Rock and reach forward/backward , cues to reach far fwd with UE's, patient not comfortable with feet apart, not rocking very far to get a good weight shift, not able to raise heel or raise toes much 7) Rock and reach sideways, cues for twist and look behind, only rotates side ways, unable to twist around or turn to look behind  functional component task with supervision 5 reps and simulated activities for: 1. Sit to standx5needs cues to begin with UE up for correct start position 2.enter/exitroom 5x. Step over line 10x each LE; follow PT for sequencing 3.putting on coat with punching into arm of coat, pull up to shoulder 10x  4.puttingconesinto/out of top shelf ofcupboard in kitchen to reach and take out5x each arm 5.Large step with rollatorand upright posturex5 each LE Patient performed with instruction, verbal cues, tactile cues of therapist: goal:increase tissue extensibility, promote proper posture, improve mobility Additional task performance: -take jacket on/off side, then zip 5x, zipping challenging, only able to perform when off of patient -locking/unlocking  rollator  Ambulate700 ft with cues for big step and foot clearance, with improved foot clearance with contact cue of BIG. With rollator and close CGA; walking into/out of rooms and into/out of hallways, negotiating obstacles and people   Pt presents with slowness of movements and fatigues with therapeutic exercises. Patient needs verbal, tactile and modeling cues with LSVT BIG exercises. Patient demonstrates difficulty with remembering to hold the seated exercises and the start and finish positions. Patient tolerated all interventions well this date and will benefit from continued skilled PT interventions to improve strength and balance and decrease risk of falling                        PT Education - 06/14/20 1049    Education provided Yes    Education Details exercise technique, body mechanics, LSVT carryover    Person(s) Educated Patient    Methods Explanation;Demonstration;Tactile cues;Verbal cues    Comprehension Verbalized understanding;Returned demonstration;Verbal cues required;Tactile cues required            PT Short Term Goals - 06/04/20 1446      PT SHORT TERM GOAL #1   Title Patient will be independent in home exercise program to improve strength/mobility for better functional independence with ADLs.    Baseline 10/4: will start program tomorrow    Time 2    Period Weeks    Status New    Target Date 06/18/20             PT Long Term Goals - 06/04/20 1447      PT LONG TERM GOAL #1   Title Patient will increase FOTO score to equal to or greater than 52% to demonstrate statistically significant improvement in mobility and quality of life.    Baseline 10/4: 47.3%    Time 4    Period Weeks    Status New    Target Date 07/02/20      PT LONG TERM GOAL #2   Title Patient will demonstrate an improved Berg Balance Score of > 41/56 as to demonstrate improved balance with ADLs such as sitting/standing and  transfer balance and reduced fall risk.     Baseline 10/4: 35/56    Time 4    Period Weeks    Status New    Target Date 07/02/20      PT LONG TERM GOAL #3   Title Client will be able to get up from soft surfaces, including couch and reclinerin his home confidently on his 1st trial 100% of the time to allow him to safely access all areas of his home.    Baseline 10/4: needs help to get up    Time 4    Period Weeks    Status New    Target Date 07/02/20      PT LONG TERM GOAL #4   Title Patient will improve his functional mobility be decreasing his cognitive TUG to < 15 seconds for improved mobility, stability, and safety with dual task to decrease fall risk.    Baseline 10/4: unable to perform cognitive aspect of test ( 29.19)    Time 4    Period Weeks    Status New    Target Date 07/02/20                 Plan - 06/14/20 1311    Clinical Impression Statement Patient continues to demonstrate little to no memory of repetitive tasks requiring max/constant verbal, visual, and tactile cueing to perform in correct body mechanics. This makes prognosis guarded at this time. Sit to stands do appear to be improving as well as ambulation however progress is limited. Continue to progress toward improved balance and reduced need for cues to sustain normalized amplitude with mobility and functional tasks    Personal Factors and Comorbidities Age;Comorbidity 3+;Fitness;Past/Current Experience;Sex;Social Background;Time since onset of injury/illness/exacerbation;Transportation    Comorbidities AAA, arthritis, atherosclerotic heart disease, (s/p CABG), benign neoplasm of ascending/descending colon, COPD, CAD, dementia, dysrhythmia, frequent falls, GERD, hemorrhoid, HOH, HTN, PAF, prostate cancer.    Examination-Activity Limitations Bathing;Bend;Carry;Dressing;Continence;Stairs;Squat;Reach Overhead;Locomotion Level;Lift;Stand;Transfers;Toileting;Bed Mobility    Examination-Participation Restrictions Church;Cleaning;Community  Activity;Driving;Interpersonal Relationship;Laundry;Volunteer;Shop;Personal Finances;Meal Prep;Yard Work    [employer] Evolving/Moderate complexity    Rehab Potential Fair    PT Frequency 4x / week    PT Duration 4 weeks    PT Treatment/Interventions ADLs/Self Care Home Management;Aquatic Therapy;Cryotherapy;Moist Heat;Traction;Ultrasound;Therapeutic activities;Functional mobility training;Stair training;Gait training;DME Instruction;Therapeutic exercise;Balance training;Neuromuscular re-education;Patient/family education;Manual techniques;Passive range of motion;Energy conservation;Vestibular;Taping;Biofeedback;Electrical Stimulation;Iontophoresis 69m/ml Dexamethasone;Dry needling;Visual/perceptual remediation/compensation;Cognitive remediation    PT Next Visit Plan LSVT    Consulted and Agree with Plan of Care Patient;Family member/caregiver    Family Member Consulted significant other           Patient will benefit from skilled therapeutic intervention in order to improve the following deficits and impairments:  Abnormal gait, Cardiopulmonary status limiting activity, Decreased activity tolerance, Decreased balance, Decreased knowledge of precautions, Decreased endurance, Decreased coordination, Decreased cognition, Decreased knowledge of use of DME, Decreased mobility, Difficulty walking, Decreased safety awareness, Decreased strength, Impaired flexibility, Impaired perceived functional ability, Impaired sensation, Postural dysfunction, Improper body mechanics, Pain, Impaired vision/preception, Decreased range of motion  Visit Diagnosis: Unsteadiness on feet  Other abnormalities of gait and mobility  Muscle weakness (generalized)     Problem List Patient Active Problem List   Diagnosis Date Noted  . LBD (Lewy body dementia) ([place] 05/14/2020  . Hematuria   . Acute kidney injury superimposed on CKD ([place]   . Sepsis due to gram-negative UTI ([place] 12/26/2019   . Left nephrolithiasis 12/26/2019  . Hydronephrosis, left 12/26/2019  . Status post abdominal aortic  aneurysm (AAA) repair 04/15/2019  . Malignant neoplasm of descending colon ([place])   . Do not intubate, cardiopulmonary resuscitation (CPR)-only code status 10/08/2018  . Prostate cancer ([place]) 02/25/2018  . Prostatic cyst 01/15/2018  . 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 ([place]) 09/15/2017  . Blood in stool   . Abnormal feces   . Stricture and stenosis of esophagus   . Mild cognitive impairment 08/18/2017  . Senile purpura ([place]) 04/29/2017  . Anemia 04/29/2017  . Frequent falls 04/29/2017  . Simple chronic bronchitis ([place]) 12/17/2016  . Benign localized hyperplasia of prostate with urinary obstruction 07/15/2016  . Elevated PSA 07/15/2016  . Incomplete emptying of bladder 07/15/2016  . Urge incontinence 07/15/2016  . Hypothyroidism 04/25/2016  . Macular degeneration 04/25/2016  . Erectile dysfunction due to arterial insufficiency   . Ischemic heart disease with chronotropic incompetence   . CN (constipation) 02/10/2015  . DD (diverticular disease) 02/10/2015  . Weakness 02/10/2015  . Lichen planus 98/42/1031  . Restless leg 02/10/2015  . 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 B12 deficiency 02/10/2015  . COPD (chronic obstructive pulmonary disease) ([place]) 11/14/2014  . Post Inflammatory Lung Changes 11/14/2014  . Acute delirium 11/08/2014  . PAF (paroxysmal atrial fibrillation) (HCC) CHA2DS2-VASc = 4. AC = Eliquis   . Insomnia 12/09/2013  . OSA on CPAP   . Dyspnea on exertion - -essentially resolved with BB dose reduction & wgt loss 03/29/2013  . Overweight (BMI 25.0-29.9) -- Wgt back up 01/17/2013  . Atherosclerotic heart disease of native coronary artery without angina pectoris - s/p CABG, occluded SVG-D1; patent LIMA-LAD, SVG-OM, SVG- RCA   . Essential hypertension   . Hyperlipidemia with target LDL less than 70   . Aorto-iliac disease ([place])   . BCC (basal cell carcinoma),  eyelid 01/03/2013  . Allergic rhinitis 08/17/2009  . Adaptation reaction 05/28/2009  . Acid reflux 05/28/2009  . Arthritis, degenerative 05/28/2009  . 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 Carotid artery occlusion without infarction 06/01/2006  . PAD (peripheral artery disease) - bilateral common iliac stents 12/15/2005  . Atherosclerotic heart disease of artery bypass graft 09/01/1997   Janna Arch, PT, DPT   06/14/2020, 1:15 PM  [place] MAIN [facility] SERVICES [address] [place], [place], [address] Phone: [phone]   Fax:  [phone]  Name: [name] MRN: [id number] Date of Birth: May 23, 1934

## 2020-06-15 ENCOUNTER — Other Ambulatory Visit: Payer: Self-pay | Admitting: Family Medicine

## 2020-06-15 DIAGNOSIS — J301 Allergic rhinitis due to pollen: Secondary | ICD-10-CM

## 2020-06-18 ENCOUNTER — Ambulatory Visit: Payer: Medicare Other | Admitting: Physical Therapy

## 2020-06-18 ENCOUNTER — Encounter: Payer: 59 | Admitting: Speech Pathology

## 2020-06-18 ENCOUNTER — Other Ambulatory Visit: Payer: Self-pay

## 2020-06-19 ENCOUNTER — Encounter: Payer: 59 | Admitting: Speech Pathology

## 2020-06-19 ENCOUNTER — Ambulatory Visit: Payer: Medicare Other

## 2020-06-19 ENCOUNTER — Other Ambulatory Visit: Payer: Self-pay

## 2020-06-19 DIAGNOSIS — R2689 Other abnormalities of gait and mobility: Secondary | ICD-10-CM

## 2020-06-19 DIAGNOSIS — R2681 Unsteadiness on feet: Secondary | ICD-10-CM

## 2020-06-19 DIAGNOSIS — M6281 Muscle weakness (generalized): Secondary | ICD-10-CM

## 2020-06-19 NOTE — Therapy (Signed)
[place] MAIN [facility] SERVICES [address] [place], [place], [address] Phone: [phone]   Fax:  [phone]  Physical Therapy Treatment  Patient Details  Name: [name] MRN: [id number] Date of Birth: [birth year] Referring Provider (PT): Meeler, Sherren Kerns FNP   Encounter Date: 06/19/2020   PT End of Session - 06/19/20 1254    Visit Number 9    Number of Visits 17    Date for PT Re-Evaluation 07/02/20    Authorization Type LSVT eval 9/10 06/04/20    PT Start Time 1100    PT Stop Time 1157    PT Time Calculation (min) 57 min    Equipment Utilized During Treatment Gait belt    Activity Tolerance Patient tolerated treatment well;Patient limited by fatigue;Other (comment)    Behavior During Therapy WFL for tasks assessed/performed           Past Medical History:  Diagnosis Date  . AAA (abdominal aortic aneurysm) ([place]) 05/2019   [place]ativley Rapid progression from June-Aug 2020 (4.6 - 5.4 cm): s/p EVAR (Dr. Lucky Cowboy [facility]): EVAR - 23 mm prox, 12 cm distal & x 18 cm length Gore Excluder Endoprosthesis Main body (via RFA distal to lowest Renal A); 8m x 14 cm L Contralateral Limb for L Iliac - extended with 8 mm x 6cm LifeStar stent (post-dilated with 7 mm DEB).  Additional R Iliac PTA not required.   . Arthritis   . Basal cell carcinoma of eyelid 01/03/2013   2019 - R side of Nose  . Benign neoplasm of ascending colon   . Benign neoplasm of descending colon   . Bilateral iliac artery stenosis ([place] 2007   Bilateral Iliac A stenting; extension of EVAR limbs into both Iliacs -- additional stent placed in L Iliac.  . Cancer ([place]    Skin cancer  . Colon polyps   . Complication of anesthesia    severe confusion agitation requiring hospital admission x 4 days  . Compression fracture of fourth lumbar vertebra ([place] 04/2019   - s/p Kyphoplasty; T12, L3-5 ([facility]  . COPD (chronic obstructive pulmonary disease) (HCC)    Centrilobular  Emphysema  . Coronary artery disease involving native coronary artery without angina pectoris 1998   - s/p CABG, occluded SVG-D1; patent LIMA-LAD, SVG-OM, SVG- RCA  . Dementia ([place]    memory loss - started noting late 2019  . Falls frequently    broke foot  . Fracture 2021   left foot  . GERD (gastroesophageal reflux disease)   . Hemorrhoid 02/10/2015  . History of hiatal hernia   . History of kidney stones   . HOH (hard of hearing)   . Hypertension   . Lewy body dementia ([place] 04/04/2020  . Macular degeneration disease   . Osteoporosis   . PAF (paroxysmal atrial fibrillation) ([place]    on Eliquis for OAC - Rate Control   . Prostate cancer ([place]   . Prostate disorder   . Sciatic pain    Chronic  . Sleep apnea    cpap  . Temporary cerebral vascular dysfunction 02/10/2015   Had negative work-up.  July, 2012.  No medication changes, had forgot them that day, got dehydrated.   . Urinary incontinence     Past Surgical History:  Procedure Laterality Date  . ABDOMINAL AORTIC ENDOVASCULAR STENT GRAFT Bilateral 05/18/2019   Procedure: ABDOMINAL AORTIC ENDOVASCULAR STENT GRAFT;  Surgeon: DAlgernon Huxley MD;  Location: [facility] ORS;  Service: Vascular;  Laterality: Bilateral;  . ANKLE SURGERY Left    ORIF   . Arch aortogram and carotid aortogram  06/02/2006   Dr Oneida Alar did surgery  . BASAL CELL CARCINOMA EXCISION  04/2016   Dermatology  . CARDIAC CATHETERIZATION  01/30/1998    (Dr. Linard Millers): Native LAD & mRCA CTO. 90% D1. Mod Cx. SVG-D1 CTO. SVG-RCA & SVG-OM along with LIMA-LAD patent;  LV NORMAL Fxn  . CAROTID ENDARTERECTOMY Left Oct. 29, 2007   Dr. Oneida Alar:  . CATARACT EXTRACTION W/PHACO Right 03/05/2017   Procedure: CATARACT EXTRACTION PHACO AND INTRAOCULAR LENS PLACEMENT (IOC);  Surgeon: Leandrew Koyanagi, MD;  Location: [facility] ORS;  Service: Ophthalmology;  Laterality: Right;  [place] 00:58.5 AP% 10.6 CDE 6.20 Fluid Pack lot # [id number]  . CATARACT EXTRACTION W/PHACO Left 04/15/2017    Procedure: CATARACT EXTRACTION PHACO AND INTRAOCULAR LENS PLACEMENT ([place])  Left;  Surgeon: Leandrew Koyanagi, MD;  Location: [place];  Service: Ophthalmology;  Laterality: Left;  . COLONOSCOPY WITH PROPOFOL N/A 09/15/2017   Procedure: COLONOSCOPY WITH PROPOFOL;  Surgeon: Lucilla Lame, MD;  Location: [facility] ENDOSCOPY;  Service: Endoscopy;  Laterality: N/A;  . COLONOSCOPY WITH PROPOFOL N/A 10/26/2018   Procedure: COLONOSCOPY WITH PROPOFOL;  Surgeon: Lucilla Lame, MD;  Location: [facility] ENDOSCOPY;  Service: Endoscopy;  Laterality: N/A;  . CORONARY ARTERY BYPASS GRAFT  1998   LIMA-LAD, SVG-OM, SVG-RPDA, SVG-DIAG  . CPET / MET  12/2012   Mild chronotropic incompetence - read 82% of predicted; also reduced effort; peak VO2 15.7 / 75% (did not reach Max effort) -- suggested ischemic response in last 1.5 minutes of exercise. Normal pulmonary function on PFTs but poor response to  . CYSTOSCOPY W/ RETROGRADES Left 01/13/2020   Procedure: CYSTOSCOPY WITH RETROGRADE PYELOGRAM;  Surgeon: Billey Co, MD;  Location: [facility] ORS;  Service: Urology;  Laterality: Left;  . CYSTOSCOPY WITH STENT PLACEMENT Left 12/26/2019   Procedure: CYSTOSCOPY WITH STENT PLACEMENT;  Surgeon: Billey Co, MD;  Location: [facility] ORS;  Service: Urology;  Laterality: Left;  . CYSTOSCOPY/URETEROSCOPY/HOLMIUM LASER/STENT PLACEMENT Left 01/13/2020   Procedure: CYSTOSCOPY/URETEROSCOPY/HOLMIUM LASER/STENT EXCHANGE;  Surgeon: Billey Co, MD;  Location: [facility] ORS;  Service: Urology;  Laterality: Left;  . ENDOVASCULAR REPAIR/STENT GRAFT N/A 05/18/2019   Procedure: ENDOVASCULAR REPAIR/STENT GRAFT;  Surgeon: Algernon Huxley, MD;  Location: [facility] INVASIVE CV LAB;; EVAR - 23 mm prox, 12 cm distal & x 18 cm length Gore Excluder Endoprosthesis Main body (via RFA distal to lowest Renal A); 50m x 14 cm L Contralateral Limb for L Iliac - extended with 8 mm x 6cm LifeStar stent; no additional R Iliac PTA needed.   . ESOPHAGOGASTRODUODENOSCOPY  (EGD) WITH PROPOFOL N/A 09/15/2017   Procedure: ESOPHAGOGASTRODUODENOSCOPY (EGD) WITH PROPOFOL;  Surgeon: WLucilla Lame MD;  [facility];  Service: Endoscopy;  Laterality: N/A;  . FRACTURE SURGERY Right 03/19/2015   wrist  . FRACTURE SURGERY Left   . HIATAL HERNIA REPAIR    . ILIAC ARTERY STENT  12/15/2005   PTA and direct stenting rgt and lft common iliac arteries  . KYPHOPLASTY N/A 04/05/2019   Procedure: KYPHOPLASTY T12, L3, L4 L5;  Surgeon: MHessie Knows MD;  Location: [facility] ORS;  Service: Orthopedics;  Laterality: N/A;  . NM GATED MYOVIEW ([place])  05/'19; 8/'20   a) [facility]: Low Risk - no ischemia or infarct.  EF > 65%. no RWMA;; b) ([facility]: Normal wall motion.  No ischemia or infarction.  . OPEN REDUCTION INTERNAL FIXATION (ORIF) DISTAL RADIAL FRACTURE Left  01/13/2019   Procedure: OPEN REDUCTION INTERNAL FIXATION (ORIF) DISTAL  RADIAL FRACTURE - LEFT - SLEEP APNEA;  Surgeon: Hessie Knows, MD;  Location: [facility] ORS;  Service: Orthopedics;  Laterality: Left;  . ORIF ELBOW FRACTURE Right 01/01/2017   Procedure: OPEN REDUCTION INTERNAL FIXATION (ORIF) ELBOW/OLECRANON FRACTURE;  Surgeon: Hessie Knows, MD;  Location: [facility] ORS;  Service: Orthopedics;  Laterality: Right;  . ORIF WRIST FRACTURE Right 03/19/2015   Procedure: OPEN REDUCTION INTERNAL FIXATION (ORIF) WRIST FRACTURE;  Surgeon: Hessie Knows, MD;  Location: [facility] ORS;  Service: Orthopedics;  Laterality: Right;  . SHOULDER ARTHROSCOPY Right   . THORACIC AORTA - CAROTID ANGIOGRAM  October 2007   Dr. 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 TRANSTHORACIC ECHOCARDIOGRAM  February 2016   [facility]: Normal LV function. Dilated left atrium.  Domingo Dimes ECHOCARDIOGRAM  04/2019   [facility] April 20, 2019): EF 55-60 %.  Mild LVH.  Relatively normal valves.    There were no vitals filed for this visit.   Subjective Assessment -  06/19/20 1252    Subjective Patient has been compliant with his twice a day exercises over the weekend, has had the forms checked off. No falls or LOB since last session .    Pertinent History Patient is a pleasant 86 year old male who presents to PT for his LSVT eval with his significant other. Patient has been seen by this therapist in the past for balance, back pain, and strength. History of hallucinations, kidney stones, kyphoplasty T12, L3, L4, L5 performed in August 2020 by Dr. Rudene Christians. MRI from [facility] dated 03/28/2019 report was reviewed today. Acute compression fractures are noted at T12, L3, and L4.PMH includes AAA, arthritis, atherosclerotic heart disease (s/p CABG), benign neoplasm of ascending/descending colon, COPD, CAD, dementia, dysrhythmia, falls, GERD, hemorrhoid, HOH, HTN, PAF, prostate cancer, osteoporosis, kidney stones, macular degeneration, Lewy Body Dementia.    Limitations Walking;Standing;House hold activities;Other (comment);Lifting;Sitting    How long can you sit comfortably? n/a    How long can you stand comfortably? needs a walker now, unsteady once standing    How long can you walk comfortably? uses a walker now; can walk in house.    Diagnostic tests MRI from [facility] dated 03/28/2019 report was reviewed today. Acute compression fractures are noted at T12, L3, and L4. He has facet arthropathy at the lower lumbar levels. At L3-4 he has moderate central stenosis.    Patient Stated Goals be more independent with ADLs and mobility    Currently in Pain? No/denies               Treatment: Patient seen for LSVT Daily Session Maximal Daily Exercises for facilitation/coordination of movement Sustained movements are designed to rescale the amplitude of movement output for generalization to daily functional activities .Performed as follows for 1 set of 10 repetitions each multidirectional sustained movements  1) Floor to ceiling , cues  to hold for 10 seconds, needs modeling for correct positions, VC to reach out furtherand to reach up to the ceiling; 8x 2) Side to side multidirectional Repetitive movements performed in sittingand are designed to provide retraining effort needed for sustained muscle activation in tasks, cues to lean fwd and hold position x 10 counts: Patient does not movesideways welland is not able to transition or stretch out leg very far; 8x each side; more challenging to R side 3) Step and reach forward ,  cues for good knee flex, cues to move UE and LEwith tactilecues required to bring arm into abduction andto rotate arms up to pronate his forearms 4) Step and reach backwards , cues for BUE back, getting toe up and bending back knee 5) Step and reach sideways, cues to turn head sideways, and turn head back to neutral, and to rotate arms and pronate forearms 6) Rock and reach forward/backward , cues to reach far fwd with UE's, patient not comfortable with feet apart, not rocking very far to get a good weight shift, not able to raise heel or raise toes much 7) Rock and reach sideways, cues for twist and look behind, only rotates side ways, unable to twist feet at this time.  functional component task with supervision 5 reps and simulated activities for: 1. Sit to standx5needs cues to begin with UE up for correct start position 2.enter/exitroom 5x. Step over line 10x each LE; follow PT for sequencing 3.putting on coat with punching into arm of coat, pull up to shoulder 10x 4.puttingconesinto/out of top shelf ofcupboardin kitchento reach and take out5x each arm 5.Large step with rollatorand upright posturex5 each LE Patient performed with instruction, verbal cues, tactile cues of therapist: goal:increase tissue extensibility, promote proper posture, improve mobility Additional task performance: -taking show on /off 5x each foot -locking/unlocking rollator Ambulate900 ft with  cues for big step and foot clearance, with improved foot clearance with contact cue of BIG. With rollator and close CGA; walking into/out of rooms and into/out of hallways, negotiating obstacles and people One rest break   Pt presents with slowness of movements and fatigues with therapeutic exercises. Patient needs verbal, tactile and modeling cues with LSVT BIG exercises. Patient demonstrates difficulty with remembering to hold the seated exercises and the start and finish positions. Patient tolerated all interventions well this date and will benefit from continued skilled PT interventions to improve strength and balance and decrease risk of falling                         PT Education - 06/19/20 1253    Education provided Yes    Education Details exercise technique, gait mechanics, LSVT    Person(s) Educated Patient    Methods Explanation;Demonstration;Tactile cues;Verbal cues    Comprehension Verbalized understanding;Returned demonstration;Verbal cues required;Tactile cues required            PT Short Term Goals - 06/04/20 1446      PT SHORT TERM GOAL #1   Title Patient will be independent in home exercise program to improve strength/mobility for better functional independence with ADLs.    Baseline 10/4: will start program tomorrow    Time 2    Period Weeks    Status New    Target Date 06/18/20             PT Long Term Goals - 06/04/20 1447      PT LONG TERM GOAL #1   Title Patient will increase FOTO score to equal to or greater than 52% to demonstrate statistically significant improvement in mobility and quality of life.    Baseline 10/4: 47.3%    Time 4    Period Weeks    Status New    Target Date 07/02/20      PT LONG TERM GOAL #2   Title Patient will demonstrate an improved Berg Balance Score of > 41/56 as to demonstrate improved balance with ADLs such as sitting/standing and transfer balance and  reduced fall risk.    Baseline 10/4: 35/56     Time 4    Period Weeks    Status New    Target Date 07/02/20      PT LONG TERM GOAL #3   Title Client will be able to get up from soft surfaces, including couch and reclinerin his home confidently on his 1st trial 100% of the time to allow him to safely access all areas of his home.    Baseline 10/4: needs help to get up    Time 4    Period Weeks    Status New    Target Date 07/02/20      PT LONG TERM GOAL #4   Title Patient will improve his functional mobility be decreasing his cognitive TUG to < 15 seconds for improved mobility, stability, and safety with dual task to decrease fall risk.    Baseline 10/4: unable to perform cognitive aspect of test ( 29.19)    Time 4    Period Weeks    Status New    Target Date 07/02/20                 Plan - 06/19/20 1305    Clinical Impression Statement Patient tolerated longest duration ambulation to date requiring only one seated rest break. Patient continues to demonstrate limited carryover between sessions however is able to perform repeated tasks with decreased verbal cueing and decreased LOB, he still requires max visual cueing. This makes prognosis guarded at this time. Sit to stands do appear to be improving as well as ambulation however progress is limited. Continue to progress toward improved balance and reduced need for cues to sustain normalized amplitude with mobility and functional tasks    Personal Factors and Comorbidities Age;Comorbidity 3+;Fitness;Past/Current Experience;Sex;Social Background;Time since onset of injury/illness/exacerbation;Transportation    Comorbidities AAA, arthritis, atherosclerotic heart disease, (s/p CABG), benign neoplasm of ascending/descending colon, COPD, CAD, dementia, dysrhythmia, frequent falls, GERD, hemorrhoid, HOH, HTN, PAF, prostate cancer.    Examination-Activity Limitations Bathing;Bend;Carry;Dressing;Continence;Stairs;Squat;Reach Overhead;Locomotion Level;Lift;Stand;Transfers;Toileting;Bed  Mobility    Examination-Participation Restrictions Church;Cleaning;Community Activity;Driving;Interpersonal Relationship;Laundry;Volunteer;Shop;Personal Finances;Meal Prep;Yard Work    [employer] Evolving/Moderate complexity    Rehab Potential Fair    PT Frequency 4x / week    PT Duration 4 weeks    PT Treatment/Interventions ADLs/Self Care Home Management;Aquatic Therapy;Cryotherapy;Moist Heat;Traction;Ultrasound;Therapeutic activities;Functional mobility training;Stair training;Gait training;DME Instruction;Therapeutic exercise;Balance training;Neuromuscular re-education;Patient/family education;Manual techniques;Passive range of motion;Energy conservation;Vestibular;Taping;Biofeedback;Electrical Stimulation;Iontophoresis 20m/ml Dexamethasone;Dry needling;Visual/perceptual remediation/compensation;Cognitive remediation    PT Next Visit Plan LSVT    Consulted and Agree with Plan of Care Patient;Family member/caregiver    Family Member Consulted significant other           Patient will benefit from skilled therapeutic intervention in order to improve the following deficits and impairments:  Abnormal gait, Cardiopulmonary status limiting activity, Decreased activity tolerance, Decreased balance, Decreased knowledge of precautions, Decreased endurance, Decreased coordination, Decreased cognition, Decreased knowledge of use of DME, Decreased mobility, Difficulty walking, Decreased safety awareness, Decreased strength, Impaired flexibility, Impaired perceived functional ability, Impaired sensation, Postural dysfunction, Improper body mechanics, Pain, Impaired vision/preception, Decreased range of motion  Visit Diagnosis: Unsteadiness on feet  Other abnormalities of gait and mobility  Muscle weakness (generalized)     Problem List Patient Active Problem List   Diagnosis Date Noted  . LBD (Lewy body dementia) ([place] 05/14/2020  . Hematuria   . Acute kidney injury  superimposed on CKD ([place]   . Sepsis due to gram-negative UTI ([place] 12/26/2019  .  Left nephrolithiasis 12/26/2019  . Hydronephrosis, left 12/26/2019  . Status post abdominal aortic aneurysm (AAA) repair 04/15/2019  . Malignant neoplasm of descending colon ([place])   . Do not intubate, cardiopulmonary resuscitation (CPR)-only code status 10/08/2018  . Prostate cancer ([place]) 02/25/2018  . Prostatic cyst 01/15/2018  . 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 ([place]) 09/15/2017  . Blood in stool   . Abnormal feces   . Stricture and stenosis of esophagus   . Mild cognitive impairment 08/18/2017  . Senile purpura ([place]) 04/29/2017  . Anemia 04/29/2017  . Frequent falls 04/29/2017  . Simple chronic bronchitis ([place]) 12/17/2016  . Benign localized hyperplasia of prostate with urinary obstruction 07/15/2016  . Elevated PSA 07/15/2016  . Incomplete emptying of bladder 07/15/2016  . Urge incontinence 07/15/2016  . Hypothyroidism 04/25/2016  . Macular degeneration 04/25/2016  . Erectile dysfunction due to arterial insufficiency   . Ischemic heart disease with chronotropic incompetence   . CN (constipation) 02/10/2015  . DD (diverticular disease) 02/10/2015  . Weakness 02/10/2015  . Lichen planus 50/56/9794  . Restless leg 02/10/2015  . 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 B12 deficiency 02/10/2015  . COPD (chronic obstructive pulmonary disease) ([place]) 11/14/2014  . Post Inflammatory Lung Changes 11/14/2014  . Acute delirium 11/08/2014  . PAF (paroxysmal atrial fibrillation) (HCC) CHA2DS2-VASc = 4. AC = Eliquis   . Insomnia 12/09/2013  . OSA on CPAP   . Dyspnea on exertion - -essentially resolved with BB dose reduction & wgt loss 03/29/2013  . Overweight (BMI 25.0-29.9) -- Wgt back up 01/17/2013  . Atherosclerotic heart disease of native coronary artery without angina pectoris - s/p CABG, occluded SVG-D1; patent LIMA-LAD, SVG-OM, SVG- RCA   . Essential hypertension   . Hyperlipidemia with target LDL  less than 70   . Aorto-iliac disease ([place])   . BCC (basal cell carcinoma), eyelid 01/03/2013  . Allergic rhinitis 08/17/2009  . Adaptation reaction 05/28/2009  . Acid reflux 05/28/2009  . Arthritis, degenerative 05/28/2009  . 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 Carotid artery occlusion without infarction 06/01/2006  . PAD (peripheral artery disease) - bilateral common iliac stents 12/15/2005  . Atherosclerotic heart disease of artery bypass graft 09/01/1997   Janna Arch, PT, DPT   06/19/2020, 1:19 PM  [place] MAIN [facility] SERVICES [address] [place], [place], [address] Phone: [phone]   Fax:  [phone]  Name: MIDAS DAUGHETY MRN: [id number] Date of Birth: [birth year]

## 2020-06-20 ENCOUNTER — Ambulatory Visit: Payer: Medicare Other

## 2020-06-20 ENCOUNTER — Other Ambulatory Visit: Payer: Self-pay

## 2020-06-20 ENCOUNTER — Encounter: Payer: 59 | Admitting: Speech Pathology

## 2020-06-20 DIAGNOSIS — R2681 Unsteadiness on feet: Secondary | ICD-10-CM

## 2020-06-20 DIAGNOSIS — R2689 Other abnormalities of gait and mobility: Secondary | ICD-10-CM

## 2020-06-20 DIAGNOSIS — M6281 Muscle weakness (generalized): Secondary | ICD-10-CM

## 2020-06-20 NOTE — Therapy (Signed)
[place] MAIN [facility] SERVICES [address] [place], [place], [address] Phone: [phone]   Fax:  [phone]  Physical Therapy Treatment Physical Therapy Progress Note   Dates of reporting period  06/04/20   to   06/20/20  Patient Details  Name: [name] MRN: [id number] Date of Birth: March 08, 1934 Referring Provider (PT): Meeler, Sherren Kerns FNP   Encounter Date: 06/20/2020   PT End of Session - 06/20/20 1116    Visit Number 10    Number of Visits 17    Date for PT Re-Evaluation 07/02/20    Authorization Type LSVT eval 10/10 06/04/20; nexr session 1/10 PN 10/20    PT Start Time 1000    PT Stop Time 1058    PT Time Calculation (min) 58 min    Equipment Utilized During Treatment Gait belt    Activity Tolerance Patient tolerated treatment well;Patient limited by fatigue;Other (comment)    Behavior During Therapy WFL for tasks assessed/performed           Past Medical History:  Diagnosis Date  . AAA (abdominal aortic aneurysm) ([place]) 05/2019   [place]ativ[place] Rapid progression from June-Aug 2020 (4.6 - 5.4 cm): s/p EVAR (Dr. Lucky Cowboy [facility]): EVAR - 23 mm prox, 12 cm distal & x 18 cm length Gore Excluder Endoprosthesis Main body (via RFA distal to lowest Renal A); 52m x 14 cm L Contralateral Limb for L Iliac - extended with 8 mm x 6cm LifeStar stent (post-dilated with 7 mm DEB).  Additional R Iliac PTA not required.   . Arthritis   . Basal cell carcinoma of eyelid 01/03/2013   2019 - R side of Nose  . Benign neoplasm of ascending colon   . Benign neoplasm of descending colon   . Bilateral iliac artery stenosis ([place] 2007   Bilateral Iliac A stenting; extension of EVAR limbs into both Iliacs -- additional stent placed in L Iliac.  . Cancer ([place]    Skin cancer  . Colon polyps   . Complication of anesthesia    severe confusion agitation requiring hospital admission x 4 days  . Compression fracture of fourth lumbar vertebra ([place] 04/2019    - s/p Kyphoplasty; T12, L3-5 ([facility]  . COPD (chronic obstructive pulmonary disease) (HCC)    Centrilobular Emphysema  . Coronary artery disease involving native coronary artery without angina pectoris 1998   - s/p CABG, occluded SVG-D1; patent LIMA-LAD, SVG-OM, SVG- RCA  . Dementia ([place]    memory loss - started noting late 2019  . Falls frequently    broke foot  . Fracture 2021   left foot  . GERD (gastroesophageal reflux disease)   . Hemorrhoid 02/10/2015  . History of hiatal hernia   . History of kidney stones   . HOH (hard of hearing)   . Hypertension   . Lewy body dementia ([place] 04/04/2020  . Macular degeneration disease   . Osteoporosis   . PAF (paroxysmal atrial fibrillation) ([place]    on Eliquis for OAC - Rate Control   . Prostate cancer ([place]   . Prostate disorder   . Sciatic pain    Chronic  . Sleep apnea    cpap  . Temporary cerebral vascular dysfunction 02/10/2015   Had negative work-up.  July, 2012.  No medication changes, had forgot them that day, got dehydrated.   . Urinary incontinence     Past Surgical History:  Procedure Laterality Date  . ABDOMINAL AORTIC ENDOVASCULAR STENT GRAFT Bilateral  05/18/2019   Procedure: ABDOMINAL AORTIC ENDOVASCULAR STENT GRAFT;  Surgeon: Algernon Huxley, MD;  Location: [facility] ORS;  Service: Vascular;  Laterality: Bilateral;  . ANKLE SURGERY Left    ORIF   . Arch aortogram and carotid aortogram  06/02/2006   Dr Oneida Alar did surgery  . BASAL CELL CARCINOMA EXCISION  04/2016   Dermatology  . CARDIAC CATHETERIZATION  01/30/1998    (Dr. Linard Millers): Native LAD & mRCA CTO. 90% D1. Mod Cx. SVG-D1 CTO. SVG-RCA & SVG-OM along with LIMA-LAD patent;  LV NORMAL Fxn  . CAROTID ENDARTERECTOMY Left Oct. 29, 2007   Dr. Oneida Alar:  . CATARACT EXTRACTION W/PHACO Right 03/05/2017   Procedure: CATARACT EXTRACTION PHACO AND INTRAOCULAR LENS PLACEMENT (IOC);  Surgeon: Leandrew Koyanagi, MD;  Location: [facility] ORS;  Service: Ophthalmology;  Laterality: Right;   [place] 00:58.5 AP% 10.6 CDE 6.20 Fluid Pack lot # [id number]  . CATARACT EXTRACTION W/PHACO Left 04/15/2017   Procedure: CATARACT EXTRACTION PHACO AND INTRAOCULAR LENS PLACEMENT ([place])  Left;  Surgeon: Leandrew Koyanagi, MD;  Location: [place];  Service: Ophthalmology;  Laterality: Left;  . COLONOSCOPY WITH PROPOFOL N/A 09/15/2017   Procedure: COLONOSCOPY WITH PROPOFOL;  Surgeon: Lucilla Lame, MD;  Location[facility] ENDOSCOPY;  Service: Endoscopy;  Laterality: N/A;  . COLONOSCOPY WITH PROPOFOL N/A 10/26/2018   Procedure: COLONOSCOPY WITH PROPOFOL;  Surgeon: Lucilla Lame, MD;  Location: [facility] ENDOSCOPY;  Service: Endoscopy;  Laterality: N/A;  . CORONARY ARTERY BYPASS GRAFT  1998   LIMA-LAD, SVG-OM, SVG-RPDA, SVG-DIAG  . CPET / MET  12/2012   Mild chronotropic incompetence - read 82% of predicted; also reduced effort; peak VO2 15.7 / 75% (did not reach Max effort) -- suggested ischemic response in last 1.5 minutes of exercise. Normal pulmonary function on PFTs but poor response to  . CYSTOSCOPY W/ RETROGRADES Left 01/13/2020   Procedure: CYSTOSCOPY WITH RETROGRADE PYELOGRAM;  Surgeon: Billey Co, MD;  Location: [facility] ORS;  Service: Urology;  Laterality: Left;  . CYSTOSCOPY WITH STENT PLACEMENT Left 12/26/2019   Procedure: CYSTOSCOPY WITH STENT PLACEMENT;  Surgeon: Billey Co, MD;  Location: [facility] ORS;  Service: Urology;  Laterality: Left;  . CYSTOSCOPY/URETEROSCOPY/HOLMIUM LASER/STENT PLACEMENT Left 01/13/2020   Procedure: CYSTOSCOPY/URETEROSCOPY/HOLMIUM LASER/STENT EXCHANGE;  Surgeon: Billey Co, MD;  Location: [facility] ORS;  Service: Urology;  Laterality: Left;  . ENDOVASCULAR REPAIR/STENT GRAFT N/A 05/18/2019   Procedure: ENDOVASCULAR REPAIR/STENT GRAFT;  Surgeon: Algernon Huxley, MD;  Location: [facility] INVASIVE CV LAB;; EVAR - 23 mm prox, 12 cm distal & x 18 cm length Gore Excluder Endoprosthesis Main body (via RFA distal to lowest Renal A); 28m x 14 cm L Contralateral Limb for L Iliac -  extended with 8 mm x 6cm LifeStar stent; no additional R Iliac PTA needed.   . ESOPHAGOGASTRODUODENOSCOPY (EGD) WITH PROPOFOL N/A 09/15/2017   Procedure: ESOPHAGOGASTRODUODENOSCOPY (EGD) WITH PROPOFOL;  Surgeon: WLucilla Lame MD;  [facility];  Service: Endoscopy;  Laterality: N/A;  . FRACTURE SURGERY Right 03/19/2015   wrist  . FRACTURE SURGERY Left   . HIATAL HERNIA REPAIR    . ILIAC ARTERY STENT  12/15/2005   PTA and direct stenting rgt and lft common iliac arteries  . KYPHOPLASTY N/A 04/05/2019   Procedure: KYPHOPLASTY T12, L3, L4 L5;  Surgeon: MHessie Knows MD;  Location: [facility] ORS;  Service: Orthopedics;  Laterality: N/A;  . NM GATED MYOVIEW ([place])  05/'19; 8/'20   a) [facility]: Low Risk - no ischemia or infarct.  EF > 65%. no  RWMA;; b) [facility]): Normal wall motion.  No ischemia or infarction.  . OPEN REDUCTION INTERNAL FIXATION (ORIF) DISTAL RADIAL FRACTURE Left 01/13/2019   Procedure: OPEN REDUCTION INTERNAL FIXATION (ORIF) DISTAL  RADIAL FRACTURE - LEFT - SLEEP APNEA;  Surgeon: Hessie Knows, MD;  Location: [facility] ORS;  Service: Orthopedics;  Laterality: Left;  . ORIF ELBOW FRACTURE Right 01/01/2017   Procedure: OPEN REDUCTION INTERNAL FIXATION (ORIF) ELBOW/OLECRANON FRACTURE;  Surgeon: Hessie Knows, MD;  Location: [facility] ORS;  Service: Orthopedics;  Laterality: Right;  . ORIF WRIST FRACTURE Right 03/19/2015   Procedure: OPEN REDUCTION INTERNAL FIXATION (ORIF) WRIST FRACTURE;  Surgeon: Hessie Knows, MD;  Location: [facility] ORS;  Service: Orthopedics;  Laterality: Right;  . SHOULDER ARTHROSCOPY Right   . THORACIC AORTA - CAROTID ANGIOGRAM  October 2007   Dr. 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 TRANSTHORACIC ECHOCARDIOGRAM  February 2016   [facility]: Normal LV function. Dilated left atrium.  Domingo Dimes ECHOCARDIOGRAM  04/2019   [facility] April 20, 2019): EF 55-60 %.  Mild LVH.   Relatively normal valves.    There were no vitals filed for this visit.   Subjective Assessment - 06/20/20 1109    Subjective Patient presents to physical therapy with his aide and remembered to bring his jacket. Reports no falls or LOB since last session.    Pertinent History Patient is a pleasant 86 year old male who presents to PT for his LSVT eval with his significant other. Patient has been seen by this therapist in the past for balance, back pain, and strength. History of hallucinations, kidney stones, kyphoplasty T12, L3, L4, L5 performed in August 2020 by Dr. Rudene Christians. MRI from [facility] dated 03/28/2019 report was reviewed today. Acute compression fractures are noted at T12, L3, and L4.PMH includes AAA, arthritis, atherosclerotic heart disease (s/p CABG), benign neoplasm of ascending/descending colon, COPD, CAD, dementia, dysrhythmia, falls, GERD, hemorrhoid, HOH, HTN, PAF, prostate cancer, osteoporosis, kidney stones, macular degeneration, Lewy Body Dementia.    Limitations Walking;Standing;House hold activities;Other (comment);Lifting;Sitting    How long can you sit comfortably? n/a    How long can you stand comfortably? needs a walker now, unsteady once standing    How long can you walk comfortably? uses a walker now; can walk in house.    Diagnostic tests MRI from [facility] dated 03/28/2019 report was reviewed today. Acute compression fractures are noted at T12, L3, and L4. He has facet arthropathy at the lower lumbar levels. At L3-4 he has moderate central stenosis.    Patient Stated Goals be more independent with ADLs and mobility    Currently in Pain? No/denies              [facility] PT Assessment - 06/20/20 0001      Standardized Balance Assessment   Standardized Balance Assessment Berg Balance Test      Berg Balance Test   Sit to Stand Able to stand without using hands and stabilize independently    Standing Unsupported Able to stand 2  minutes with supervision    Sitting with Back Unsupported but Feet Supported on Floor or Stool Able to sit safely and securely 2 minutes    Stand to Sit Sits safely with minimal use of hands    Transfers Able to transfer safely, definite need of hands    Standing Unsupported with Eyes Closed Able to stand 10 seconds with supervision  Standing Unsupported with Feet Together Able to place feet together independently and stand for 1 minute with supervision    From Standing, Reach Forward with Outstretched Arm Can reach forward >12 cm safely (5")    From Standing Position, Pick up Object from [place] to pick up shoe, needs supervision    From Standing Position, Turn to Look Behind Over each Shoulder Looks behind one side only/other side shows less weight shift    Turn 360 Degrees Able to turn 360 degrees safely one side only in 4 seconds or less    Standing Unsupported, Alternately Place Feet on Step/Stool Able to stand independently and complete 8 steps >20 seconds    Standing Unsupported, One Foot in Front Able to take small step independently and hold 30 seconds    Standing on One Leg Tries to lift leg/unable to hold 3 seconds but remains standing independently    Total Score 42         Goals:  Foto : 63.7%  BERG: 42/56  Get up from couch: is getting easier when remembering to lean forward, doesn't always remember to lean forward  TUG: 18 knocking over cones : 17.40 seconds knocking cones over     Treatment: Patient seen for LSVT Daily Session Maximal Daily Exercises for facilitation/coordination of movement Sustained movements are designed to rescale the amplitude of movement output for generalization to daily functional activities .Performed as follows for 1 set of 10 repetitions each multidirectional sustained movements  1) Floor to ceiling , cues to hold for 10 seconds, needs modeling for correct positions, VC to reach out furtherand to reach up to the ceiling; 8x 2) Side to  side multidirectional Repetitive movements performed in sittingand are designed to provide retraining effort needed for sustained muscle activation in tasks, cues to lean fwd and hold position x 10 counts: Patient does not movesideways welland is not able to transition or stretch out leg very far; 8x each side; more challenging to R side 3) Step and reach forward , cues for good knee flex, cues to move UE and LEwith tactilecues required to bring arm into abduction andto rotate arms up to pronate his forearms 4) Step and reach backwards , cues for BUE back, getting toe up and bending back knee 5) Step and reach sideways, cues to turn head sideways, and turn head back to neutral, and to rotate arms and pronate forearms 6) Rock and reach forward/backward , cues to reach far fwd with UE's, patient not comfortable with feet apart, not rocking very far to get a good weight shift, not able to raise heel or raise toes much 7) Rock and reach sideways, cues for twist and look behind, only rotates side ways, unable to twist feet at this time.  functional component task with supervision 5 reps and simulated activities for: 1. Sit to standx5needs cues to begin with UE up for correct start position 2.enter/exitroom 5x.  3.putting on/off coat 5x, requires assistance only once 4.putting paper plates into/out of top shelf ofcupboardin kitchento reach and take out5x each arm 5.Large step with rollatorand upright posturex5 each LE Patient performed with instruction, verbal cues, tactile cues of therapist: goal:increase tissue extensibility, promote proper posture, improve mobility  Homework: take a nonbreakable cup into/out of cupboard 5x  Pt presents with slowness of movements and fatigues with therapeutic exercises. Patient needs verbal, tactile and modeling cues with LSVT BIG exercises. Patient demonstrates difficulty with remembering to hold the seated exercises and the start and  finish positions. Patient tolerated all interventions well this date and will benefit from continued skilled PT interventions to improve strength and balance and decrease risk of falling       Patient's condition has the potential to improve in response to therapy. Maximum improvement is yet to be obtained. The anticipated improvement is attainable and reasonable in a generally predictable time.  Patient reports he feels steadier and stronger, is starting to understand why he has to do the exercises.                      PT Education - 06/20/20 1111    Education provided Yes    Education Details goals, POC, exercise technique, body mechanics, LSVT    Person(s) Educated Patient    Methods Explanation;Demonstration;Tactile cues;Verbal cues;Handout    Comprehension Verbalized understanding;Returned demonstration;Verbal cues required;Tactile cues required            PT Short Term Goals - 06/20/20 1122      PT SHORT TERM GOAL #1   Title Patient will be independent in home exercise program to improve strength/mobility for better functional independence with ADLs.    Baseline 10/4: will start program tomorrow 10/20: performing but requires assistance    Time 2    Period Weeks    Status Partially Met    Target Date 07/02/20             PT Long Term Goals - 06/20/20 1123      PT LONG TERM GOAL #1   Title Patient will increase FOTO score to equal to or greater than 52% to demonstrate statistically significant improvement in mobility and quality of life.    Baseline 10/4: 47.3% 10/20: 63.7%    Time 4    Period Weeks    Status Achieved      PT LONG TERM GOAL #2   Title Patient will demonstrate an improved Berg Balance Score of > 41/56 as to demonstrate improved balance with ADLs such as sitting/standing and transfer balance and reduced fall risk.    Baseline 10/4: 35/56 10/20: 42/56    Time 4    Period Weeks    Status Achieved      PT LONG TERM GOAL #3    Title Client will be able to get up from soft surfaces, including couch and reclinerin his home confidently on his 1st trial 100% of the time to allow him to safely access all areas of his home.    Baseline 10/4: needs help to get up 10/20: able to perform 50-60% of the time    Time 4    Period Weeks    Status Partially Met    Target Date 07/02/20      PT LONG TERM GOAL #4   Title Patient will improve his functional mobility be decreasing his cognitive TUG to < 15 seconds for improved mobility, stability, and safety with dual task to decrease fall risk.    Baseline 10/4: unable to perform cognitive aspect of test ( 29.19) 10/20: unable to perform cognitive portion 17.4 seconds wiht standard portion    Time 4    Period Weeks    Status Partially Met    Target Date 07/02/20                 Plan - 06/20/20 1129    Clinical Impression Statement Although patient continues to require maximal visual cues with mod tactile cueing for daily interventions resulting in a guarded prognosis  he does demonstrates excellent progression towards goals. He is unable to perform cognitive dual task this session due to distraction of multiple other people in clinic and fatigue. He has met his FOTO and BERG goal and made progress with transfer and TUG. Patient's condition has the potential to improve in response to therapy. Maximum improvement is yet to be obtained. The anticipated improvement is attainable and reasonable in a generally predictable time. Patient will benefit from continued skilled physical therapy in the LSVT program to continue to progress toward improved balance and reduced need for cues to sustain normalized amplitude with mobility and functional tasks.    Personal Factors and Comorbidities Age;Comorbidity 3+;Fitness;Past/Current Experience;Sex;Social Background;Time since onset of injury/illness/exacerbation;Transportation    Comorbidities AAA, arthritis, atherosclerotic heart disease, (s/p  CABG), benign neoplasm of ascending/descending colon, COPD, CAD, dementia, dysrhythmia, frequent falls, GERD, hemorrhoid, HOH, HTN, PAF, prostate cancer.    Examination-Activity Limitations Bathing;Bend;Carry;Dressing;Continence;Stairs;Squat;Reach Overhead;Locomotion Level;Lift;Stand;Transfers;Toileting;Bed Mobility    Examination-Participation Restrictions Church;Cleaning;Community Activity;Driving;Interpersonal Relationship;Laundry;Volunteer;Shop;Personal Finances;Meal Prep;Yard Work    [employer] Evolving/Moderate complexity    Rehab Potential Fair    PT Frequency 4x / week    PT Duration 4 weeks    PT Treatment/Interventions ADLs/Self Care Home Management;Aquatic Therapy;Cryotherapy;Moist Heat;Traction;Ultrasound;Therapeutic activities;Functional mobility training;Stair training;Gait training;DME Instruction;Therapeutic exercise;Balance training;Neuromuscular re-education;Patient/family education;Manual techniques;Passive range of motion;Energy conservation;Vestibular;Taping;Biofeedback;Electrical Stimulation;Iontophoresis 48m/ml Dexamethasone;Dry needling;Visual/perceptual remediation/compensation;Cognitive remediation    PT Next Visit Plan LSVT    Consulted and Agree with Plan of Care Patient;Family member/caregiver    Family Member Consulted significant other           Patient will benefit from skilled therapeutic intervention in order to improve the following deficits and impairments:  Abnormal gait, Cardiopulmonary status limiting activity, Decreased activity tolerance, Decreased balance, Decreased knowledge of precautions, Decreased endurance, Decreased coordination, Decreased cognition, Decreased knowledge of use of DME, Decreased mobility, Difficulty walking, Decreased safety awareness, Decreased strength, Impaired flexibility, Impaired perceived functional ability, Impaired sensation, Postural dysfunction, Improper body mechanics, Pain, Impaired vision/preception,  Decreased range of motion  Visit Diagnosis: Unsteadiness on feet  Other abnormalities of gait and mobility  Muscle weakness (generalized)     Problem List Patient Active Problem List   Diagnosis Date Noted  . LBD (Lewy body dementia) ([place] 05/14/2020  . Hematuria   . Acute kidney injury superimposed on CKD ([place]   . Sepsis due to gram-negative UTI (HL[place] 12/26/2019  . Left nephrolithiasis 12/26/2019  . Hydronephrosis, left 12/26/2019  . Status post abdominal aortic aneurysm (AAA) repair 04/15/2019  . Malignant neoplasm of descending colon (HT[place]s   . Do not intubate, cardiopulmonary resuscitation (CPR)-only code status 10/08/2018  . Prostate cancer (HCat[place] 02/25/2018  . Prostatic cyst 01/15/2018  . 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 (HCopem[place] 09/15/2017  . Blood in stool   . Abnormal feces   . Stricture and stenosis of esophagus   . Mild cognitive impairment 08/18/2017  . Senile purpura ([place] 04/29/2017  . Anemia 04/29/2017  . Frequent falls 04/29/2017  . Simple chronic bronchitis ([place] 12/17/2016  . Benign localized hyperplasia of prostate with urinary obstruction 07/15/2016  . Elevated PSA 07/15/2016  . Incomplete emptying of bladder 07/15/2016  . Urge incontinence 07/15/2016  . Hypothyroidism 04/25/2016  . Macular degeneration 04/25/2016  . Erectile dysfunction due to arterial insufficiency   . Ischemic heart disease with chronotropic incompetence   . CN (constipation) 02/10/2015  . DD (diverticular disease) 02/10/2015  . Weakness 02/10/2015  . Lichen planus 037/48/2707 . Restless leg 02/10/2015  . 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  B12 deficiency 02/10/2015  . COPD (chronic obstructive pulmonary disease) ([place]) 11/14/2014  . Post Inflammatory Lung Changes 11/14/2014  . Acute delirium 11/08/2014  . PAF (paroxysmal atrial fibrillation) (HCC) CHA2DS2-VASc = 4. AC = Eliquis   . Insomnia 12/09/2013  . OSA on CPAP   . Dyspnea on exertion - -essentially resolved with BB  dose reduction & wgt loss 03/29/2013  . Overweight (BMI 25.0-29.9) -- Wgt back up 01/17/2013  . Atherosclerotic heart disease of native coronary artery without angina pectoris - s/p CABG, occluded SVG-D1; patent LIMA-LAD, SVG-OM, SVG- RCA   . Essential hypertension   . Hyperlipidemia with target LDL less than 70   . Aorto-iliac disease ([place])   . BCC (basal cell carcinoma), eyelid 01/03/2013  . Allergic rhinitis 08/17/2009  . Adaptation reaction 05/28/2009  . Acid reflux 05/28/2009  . Arthritis, degenerative 05/28/2009  . 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 Carotid artery occlusion without infarction 06/01/2006  . PAD (peripheral artery disease) - bilateral common iliac stents 12/15/2005  . Atherosclerotic heart disease of artery bypass graft 09/01/1997   Janna Arch, PT, DPT   06/20/2020, 11:31 AM  [place] MAIN [facility] SERVICES [address] [place], [place], [address] Phone: [phone]   Fax:  [phone]  Name: [name] MRN: [id number] Date of Birth: 19-Jul-1934

## 2020-06-21 ENCOUNTER — Other Ambulatory Visit: Payer: Self-pay

## 2020-06-21 ENCOUNTER — Encounter: Payer: 59 | Admitting: Speech Pathology

## 2020-06-21 ENCOUNTER — Ambulatory Visit: Payer: Medicare Other

## 2020-06-21 DIAGNOSIS — R2689 Other abnormalities of gait and mobility: Secondary | ICD-10-CM

## 2020-06-21 DIAGNOSIS — M6281 Muscle weakness (generalized): Secondary | ICD-10-CM

## 2020-06-21 DIAGNOSIS — R2681 Unsteadiness on feet: Secondary | ICD-10-CM | POA: Diagnosis not present

## 2020-06-21 NOTE — Therapy (Signed)
[place] MAIN [facility] SERVICES [address] [place], [place], [address] Phone: [phone]   Fax:  [phone]  Physical Therapy Treatment  Patient Details  Name: [name] MRN: [id number] Date of Birth: 05-24-1934 Referring Provider (PT): Meeler, Sherren Kerns FNP   Encounter Date: 06/21/2020   PT End of Session - 06/21/20 1542    Visit Number 11    Number of Visits 17    Date for PT Re-Evaluation 07/02/20    PT Start Time 1100    PT Stop Time 1159    PT Time Calculation (min) 59 min    Equipment Utilized During Treatment Gait belt    Activity Tolerance Patient tolerated treatment well;Patient limited by fatigue;Other (comment)    Behavior During Therapy WFL for tasks assessed/performed           Past Medical History:  Diagnosis Date  . AAA (abdominal aortic aneurysm) ([place]) 05/2019   [place] Rapid progression from June-Aug 2020 (4.6 - 5.4 cm): s/p EVAR (Dr. [facility]): EVAR - 23 mm prox, 12 cm distal & x 18 cm length Gore Excluder Endoprosthesis Main body (via RFA distal to lowest Renal A); 30m x 14 cm L Contralateral Limb for L Iliac - extended with 8 mm x 6cm LifeStar stent (post-dilated with 7 mm DEB).  Additional R Iliac PTA not required.   . Arthritis   . Basal cell carcinoma of eyelid 01/03/2013   2019 - R side of Nose  . Benign neoplasm of ascending colon   . Benign neoplasm of descending colon   . Bilateral iliac artery stenosis ([place] 2007   Bilateral Iliac A stenting; extension of EVAR limbs into both Iliacs -- additional stent placed in L Iliac.  . Cancer ([place]    Skin cancer  . Colon polyps   . Complication of anesthesia    severe confusion agitation requiring hospital admission x 4 days  . Compression fracture of fourth lumbar vertebra ([place] 04/2019   - s/p Kyphoplasty; T12, [facility]  . COPD (chronic obstructive pulmonary disease) (HCC)    Centrilobular Emphysema  . Coronary artery disease involving  native coronary artery without angina pectoris 1998   - s/p CABG, occluded SVG-D1; patent LIMA-LAD, SVG-OM, SVG- RCA  . Dementia ([place]    memory loss - started noting late 2019  . Falls frequently    broke foot  . Fracture 2021   left foot  . GERD (gastroesophageal reflux disease)   . Hemorrhoid 02/10/2015  . History of hiatal hernia   . History of kidney stones   . HOH (hard of hearing)   . Hypertension   . Lewy body dementia ([place] 04/04/2020  . Macular degeneration disease   . Osteoporosis   . PAF (paroxysmal atrial fibrillation) ([place]    on Eliquis for OAC - Rate Control   . Prostate cancer ([place]   . Prostate disorder   . Sciatic pain    Chronic  . Sleep apnea    cpap  . Temporary cerebral vascular dysfunction 02/10/2015   Had negative work-up.  July, 2012.  No medication changes, had forgot them that day, got dehydrated.   . Urinary incontinence     Past Surgical History:  Procedure Laterality Date  . ABDOMINAL AORTIC ENDOVASCULAR STENT GRAFT Bilateral 05/18/2019   Procedure: ABDOMINAL AORTIC ENDOVASCULAR STENT GRAFT;  Surgeon: DAlgernon Huxley MD;  Location: [facility] ORS;  Service: Vascular;  Laterality: Bilateral;  . ANKLE SURGERY Left  ORIF   . Arch aortogram and carotid aortogram  06/02/2006   Dr Oneida Alar did surgery  . BASAL CELL CARCINOMA EXCISION  04/2016   Dermatology  . CARDIAC CATHETERIZATION  01/30/1998    (Dr. Linard Millers): Native LAD & mRCA CTO. 90% D1. Mod Cx. SVG-D1 CTO. SVG-RCA & SVG-OM along with LIMA-LAD patent;  LV NORMAL Fxn  . CAROTID ENDARTERECTOMY Left Oct. 29, 2007   Dr. Oneida Alar:  . CATARACT EXTRACTION W/PHACO Right 03/05/2017   Procedure: CATARACT EXTRACTION PHACO AND INTRAOCULAR LENS PLACEMENT (IOC);  Surgeon: Leandrew Koyanagi, MD;  Location: [facility] ORS;  Service: Ophthalmology;  Laterality: Right;  [place] 00:58.5 AP% 10.6 CDE 6.20 Fluid Pack lot # [id number]  . CATARACT EXTRACTION W/PHACO Left 04/15/2017   Procedure: CATARACT EXTRACTION PHACO AND  INTRAOCULAR LENS PLACEMENT ([place])  Left;  Surgeon: Leandrew Koyanagi, MD;  Location: [place];  Service: Ophthalmology;  Laterality: Left;  . COLONOSCOPY WITH PROPOFOL N/A 09/15/2017   Procedure: COLONOSCOPY WITH PROPOFOL;  Surgeon: Lucilla Lame, MD;  Location[facility] ENDOSCOPY;  Service: Endoscopy;  Laterality: N/A;  . COLONOSCOPY WITH PROPOFOL N/A 10/26/2018   Procedure: COLONOSCOPY WITH PROPOFOL;  Surgeon: Lucilla Lame, MD;  Location: [facility] ENDOSCOPY;  Service: Endoscopy;  Laterality: N/A;  . CORONARY ARTERY BYPASS GRAFT  1998   LIMA-LAD, SVG-OM, SVG-RPDA, SVG-DIAG  . CPET / MET  12/2012   Mild chronotropic incompetence - read 82% of predicted; also reduced effort; peak VO2 15.7 / 75% (did not reach Max effort) -- suggested ischemic response in last 1.5 minutes of exercise. Normal pulmonary function on PFTs but poor response to  . CYSTOSCOPY W/ RETROGRADES Left 01/13/2020   Procedure: CYSTOSCOPY WITH RETROGRADE PYELOGRAM;  Surgeon: Billey Co, MD;  Location: [facility] ORS;  Service: Urology;  Laterality: Left;  . CYSTOSCOPY WITH STENT PLACEMENT Left 12/26/2019   Procedure: CYSTOSCOPY WITH STENT PLACEMENT;  Surgeon: Billey Co, MD;  Location: [facility] ORS;  Service: Urology;  Laterality: Left;  . CYSTOSCOPY/URETEROSCOPY/HOLMIUM LASER/STENT PLACEMENT Left 01/13/2020   Procedure: CYSTOSCOPY/URETEROSCOPY/HOLMIUM LASER/STENT EXCHANGE;  Surgeon: Billey Co, MD;  Location: [facility] ORS;  Service: Urology;  Laterality: Left;  . ENDOVASCULAR REPAIR/STENT GRAFT N/A 05/18/2019   Procedure: ENDOVASCULAR REPAIR/STENT GRAFT;  Surgeon: Algernon Huxley, MD;  Location: [facility] INVASIVE CV LAB;; EVAR - 23 mm prox, 12 cm distal & x 18 cm length Gore Excluder Endoprosthesis Main body (via RFA distal to lowest Renal A); 33m x 14 cm L Contralateral Limb for L Iliac - extended with 8 mm x 6cm LifeStar stent; no additional R Iliac PTA needed.   . ESOPHAGOGASTRODUODENOSCOPY (EGD) WITH PROPOFOL N/A 09/15/2017    Procedure: ESOPHAGOGASTRODUODENOSCOPY (EGD) WITH PROPOFOL;  Surgeon: WLucilla Lame MD;  [facility];  Service: Endoscopy;  Laterality: N/A;  . FRACTURE SURGERY Right 03/19/2015   wrist  . FRACTURE SURGERY Left   . HIATAL HERNIA REPAIR    . ILIAC ARTERY STENT  12/15/2005   PTA and direct stenting rgt and lft common iliac arteries  . KYPHOPLASTY N/A 04/05/2019   Procedure: KYPHOPLASTY T12, L3, L4 L5;  Surgeon: MHessie Knows MD;  Location: [facility] ORS;  Service: Orthopedics;  Laterality: N/A;  . NM GATED MYOVIEW ([place])  05/'19; 8/'20   a) [facility]: Low Risk - no ischemia or infarct.  EF > 65%. no RWMA;; b) ([facility]: Normal wall motion.  No ischemia or infarction.  . OPEN REDUCTION INTERNAL FIXATION (ORIF) DISTAL RADIAL FRACTURE Left 01/13/2019   Procedure: OPEN REDUCTION INTERNAL FIXATION (ORIF) DISTAL  RADIAL FRACTURE - LEFT - SLEEP APNEA;  Surgeon: Hessie Knows, MD;  Location: [facility] ORS;  Service: Orthopedics;  Laterality: Left;  . ORIF ELBOW FRACTURE Right 01/01/2017   Procedure: OPEN REDUCTION INTERNAL FIXATION (ORIF) ELBOW/OLECRANON FRACTURE;  Surgeon: Hessie Knows, MD;  Location: [facility] ORS;  Service: Orthopedics;  Laterality: Right;  . ORIF WRIST FRACTURE Right 03/19/2015   Procedure: OPEN REDUCTION INTERNAL FIXATION (ORIF) WRIST FRACTURE;  Surgeon: Hessie Knows, MD;  Location: [facility] ORS;  Service: Orthopedics;  Laterality: Right;  . SHOULDER ARTHROSCOPY Right   . THORACIC AORTA - CAROTID ANGIOGRAM  October 2007   Dr. 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 TRANSTHORACIC ECHOCARDIOGRAM  February 2016   [facility]: Normal LV function. Dilated left atrium.  Domingo Dimes ECHOCARDIOGRAM  04/2019   [facility] April 20, 2019): EF 55-60 %.  Mild LVH.  Relatively normal valves.    There were no vitals filed for this visit.   Subjective Assessment - 06/21/20 1540    Subjective Patient  presents to PT more confused this session and tired. Reports he slept ok but is tangential    Pertinent History Patient is a pleasant 86 year old male who presents to PT for his LSVT eval with his significant other. Patient has been seen by this therapist in the past for balance, back pain, and strength. History of hallucinations, kidney stones, kyphoplasty T12, L3, L4, L5 performed in August 2020 by Dr. Rudene Christians. [facility] dated 03/28/2019 report was reviewed today. Acute compression fractures are noted at T12, L3, and L4.PMH includes AAA, arthritis, atherosclerotic heart disease (s/p CABG), benign neoplasm of ascending/descending colon, COPD, CAD, dementia, dysrhythmia, falls, GERD, hemorrhoid, HOH, HTN, PAF, prostate cancer, osteoporosis, kidney stones, macular degeneration, Lewy Body Dementia.    Limitations Walking;Standing;House hold activities;Other (comment);Lifting;Sitting    How long can you sit comfortably? n/a    How long can you stand comfortably? needs a walker now, unsteady once standing    How long can you walk comfortably? uses a walker now; can walk in house.    Diagnostic tests MRI from [facility] dated 03/28/2019 report was reviewed today. Acute compression fractures are noted at T12, L3, and L4. He has facet arthropathy at the lower lumbar levels. At L3-4 he has moderate central stenosis.    Patient Stated Goals be more independent with ADLs and mobility    Currently in Pain? No/denies                 Treatment: Patient seen for LSVT Daily Session Maximal Daily Exercises for facilitation/coordination of movement Sustained movements are designed to rescale the amplitude of movement output for generalization to daily functional activities .Performed as follows for 1 set of 10 repetitions each multidirectional sustained movements  1) Floor to ceiling , cues to hold for 10 seconds, needs modeling for correct positions, VC to reach  out furtherand to reach up to the ceiling; 8x 2) Side to side multidirectional Repetitive movements performed in sittingand are designed to provide retraining effort needed for sustained muscle activation in tasks, cues to lean fwd and hold position x 10 counts: Patient does not movesideways welland is not able to transition or stretch out leg very far; 8x each side; more challenging to R side 3) Step and reach forward , cues for good knee flex, cues to move UE and LEwith tactilecues required to bring arm into abduction andto  rotate arms up to pronate his forearms 4) Step and reach backwards , cues for BUE back, getting toe up and bending back knee 5) Step and reach sideways, cues to turn head sideways, and turn head back to neutral, and to rotate arms and pronate forearms 6) Rock and reach forward/backward , cues to reach far fwd with UE's, patient not comfortable with feet apart, not rocking very far to get a good weight shift, not able to raise heel or raise toes much 7) Rock and reach sideways, cues for twist and look behind, only rotates side ways, unable to twistfeet at this time.  functional component task with supervision 5 reps and simulated activities for: 1. Sit to standx5needs cues to begin with UE up for correct start position 2.enter/exitroom 5x.  3.putting on/off coat 5x, requires assistance only once 4.putting paper plates into/out of top shelf ofcupboardin kitchento reach and take out5x each arm 5.Large step with rollatorand upright posturex5 each LE Patient performed with instruction, verbal cues, tactile cues of therapist: goal:increase tissue extensibility, promote proper posture, improve mobility Ambulate>1000 ft with cues for big step and foot clearance, with improved foot clearance with contact cue of BIG. With rollator and close CGA;walking into/out of rooms and into/out of hallways, negotiating obstacles and people. Negotiation of outside  sidewalk, turns, and terrain tolerated well. 2 rest breaks due to fatigue.   Homework: get into/out of bed 5x  Pt presents with slowness of movements and fatigues with therapeutic exercises. Patient needs verbal, tactile and modeling cues with LSVT BIG exercises. Patient demonstrates difficulty with remembering to hold the seated exercises and the start and finish positions. Patient tolerated all interventions well this date and will benefit from continued skilled PT interventions to improve strength and balance and decrease risk of falling                    PT Education - 06/21/20 1542    Education provided Yes    Education Details exercise technique, body mechanics    Person(s) Educated Patient    Methods Explanation;Demonstration;Tactile cues;Verbal cues    Comprehension Verbalized understanding;Returned demonstration;Verbal cues required;Tactile cues required            PT Short Term Goals - 06/20/20 1122      PT SHORT TERM GOAL #1   Title Patient will be independent in home exercise program to improve strength/mobility for better functional independence with ADLs.    Baseline 10/4: will start program tomorrow 10/20: performing but requires assistance    Time 2    Period Weeks    Status Partially Met    Target Date 07/02/20             PT Long Term Goals - 06/20/20 1123      PT LONG TERM GOAL #1   Title Patient will increase FOTO score to equal to or greater than 52% to demonstrate statistically significant improvement in mobility and quality of life.    Baseline 10/4: 47.3% 10/20: 63.7%    Time 4    Period Weeks    Status Achieved      PT LONG TERM GOAL #2   Title Patient will demonstrate an improved Berg Balance Score of > 41/56 as to demonstrate improved balance with ADLs such as sitting/standing and transfer balance and reduced fall risk.    Baseline 10/4: 35/56 10/20: 42/56    Time 4    Period Weeks    Status Achieved  PT LONG TERM  GOAL #3   Title Client will be able to get up from soft surfaces, including couch and reclinerin his home confidently on his 1st trial 100% of the time to allow him to safely access all areas of his home.    Baseline 10/4: needs help to get up 10/20: able to perform 50-60% of the time    Time 4    Period Weeks    Status Partially Met    Target Date 07/02/20      PT LONG TERM GOAL #4   Title Patient will improve his functional mobility be decreasing his cognitive TUG to < 15 seconds for improved mobility, stability, and safety with dual task to decrease fall risk.    Baseline 10/4: unable to perform cognitive aspect of test ( 29.19) 10/20: unable to perform cognitive portion 17.4 seconds wiht standard portion    Time 4    Period Weeks    Status Partially Met    Target Date 07/02/20                 Plan - 06/21/20 1543    Clinical Impression Statement Patient presents with increased cognitive challenge requiring more frequent task orientation and cueing. He becomes more easily frustrated and fatigued this session making prognosis guarded at this time. Outdoor negotiation of obstacles is challenging due to patient limited visual fields and quick fatigue. Patient will benefit from continued skilled physical therapy in the LSVT program to continue to progress toward improved balance and reduced need for cues to sustain normalized amplitude with mobility and functional tasks.    Personal Factors and Comorbidities Age;Comorbidity 3+;Fitness;Past/Current Experience;Sex;Social Background;Time since onset of injury/illness/exacerbation;Transportation    Comorbidities AAA, arthritis, atherosclerotic heart disease, (s/p CABG), benign neoplasm of ascending/descending colon, COPD, CAD, dementia, dysrhythmia, frequent falls, GERD, hemorrhoid, HOH, HTN, PAF, prostate cancer.    Examination-Activity Limitations Bathing;Bend;Carry;Dressing;Continence;Stairs;Squat;Reach Overhead;Locomotion  Level;Lift;Stand;Transfers;Toileting;Bed Mobility    Examination-Participation Restrictions Church;Cleaning;Community Activity;Driving;Interpersonal Relationship;Laundry;Volunteer;Shop;Personal Finances;Meal Prep;Yard Work    [employer] Evolving/Moderate complexity    Rehab Potential Fair    PT Frequency 4x / week    PT Duration 4 weeks    PT Treatment/Interventions ADLs/Self Care Home Management;Aquatic Therapy;Cryotherapy;Moist Heat;Traction;Ultrasound;Therapeutic activities;Functional mobility training;Stair training;Gait training;DME Instruction;Therapeutic exercise;Balance training;Neuromuscular re-education;Patient/family education;Manual techniques;Passive range of motion;Energy conservation;Vestibular;Taping;Biofeedback;Electrical Stimulation;Iontophoresis 88m/ml Dexamethasone;Dry needling;Visual/perceptual remediation/compensation;Cognitive remediation    PT Next Visit Plan LSVT    Consulted and Agree with Plan of Care Patient;Family member/caregiver    Family Member Consulted significant other           Patient will benefit from skilled therapeutic intervention in order to improve the following deficits and impairments:  Abnormal gait, Cardiopulmonary status limiting activity, Decreased activity tolerance, Decreased balance, Decreased knowledge of precautions, Decreased endurance, Decreased coordination, Decreased cognition, Decreased knowledge of use of DME, Decreased mobility, Difficulty walking, Decreased safety awareness, Decreased strength, Impaired flexibility, Impaired perceived functional ability, Impaired sensation, Postural dysfunction, Improper body mechanics, Pain, Impaired vision/preception, Decreased range of motion  Visit Diagnosis: Unsteadiness on feet  Other abnormalities of gait and mobility  Muscle weakness (generalized)     Problem List Patient Active Problem List   Diagnosis Date Noted  . LBD (Lewy body dementia) ([place] 05/14/2020   . Hematuria   . Acute kidney injury superimposed on CKD ([place]   . Sepsis due to gram-negative UTI ([place] 12/26/2019  . Left nephrolithiasis 12/26/2019  . Hydronephrosis, left 12/26/2019  . Status post abdominal aortic aneurysm (AAA) repair 04/15/2019  . Malignant neoplasm  of descending colon ([place])   . Do not intubate, cardiopulmonary resuscitation (CPR)-only code status 10/08/2018  . Prostate cancer ([place]) 02/25/2018  . Prostatic cyst 01/15/2018  . 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 ([place]) 09/15/2017  . Blood in stool   . Abnormal feces   . Stricture and stenosis of esophagus   . Mild cognitive impairment 08/18/2017  . Senile purpura ([place]) 04/29/2017  . Anemia 04/29/2017  . Frequent falls 04/29/2017  . Simple chronic bronchitis ([place]) 12/17/2016  . Benign localized hyperplasia of prostate with urinary obstruction 07/15/2016  . Elevated PSA 07/15/2016  . Incomplete emptying of bladder 07/15/2016  . Urge incontinence 07/15/2016  . Hypothyroidism 04/25/2016  . Macular degeneration 04/25/2016  . Erectile dysfunction due to arterial insufficiency   . Ischemic heart disease with chronotropic incompetence   . CN (constipation) 02/10/2015  . DD (diverticular disease) 02/10/2015  . Weakness 02/10/2015  . Lichen planus 97/35/3299  . Restless leg 02/10/2015  . 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 B12 deficiency 02/10/2015  . COPD (chronic obstructive pulmonary disease) ([place]) 11/14/2014  . Post Inflammatory Lung Changes 11/14/2014  . Acute delirium 11/08/2014  . PAF (paroxysmal atrial fibrillation) (HCC) CHA2DS2-VASc = 4. AC = Eliquis   . Insomnia 12/09/2013  . OSA on CPAP   . Dyspnea on exertion - -essentially resolved with BB dose reduction & wgt loss 03/29/2013  . Overweight (BMI 25.0-29.9) -- Wgt back up 01/17/2013  . Atherosclerotic heart disease of native coronary artery without angina pectoris - s/p CABG, occluded SVG-D1; patent LIMA-LAD, SVG-OM, SVG- RCA   . Essential  hypertension   . Hyperlipidemia with target LDL less than 70   . Aorto-iliac disease ([place])   . BCC (basal cell carcinoma), eyelid 01/03/2013  . Allergic rhinitis 08/17/2009  . Adaptation reaction 05/28/2009  . Acid reflux 05/28/2009  . Arthritis, degenerative 05/28/2009  . 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 Carotid artery occlusion without infarction 06/01/2006  . PAD (peripheral artery disease) - bilateral common iliac stents 12/15/2005  . Atherosclerotic heart disease of artery bypass graft 09/01/1997   Janna Arch, PT, DPT   06/21/2020, 3:44 PM  [place] MAIN [facility] SERVICES [address] [place], [place], [address] Phone: [phone]   Fax:  [phone]  Name: ROSHARD REZABEK MRN: [id number] Date of Birth: 1934-05-15

## 2020-06-22 ENCOUNTER — Other Ambulatory Visit: Payer: Self-pay

## 2020-06-22 ENCOUNTER — Ambulatory Visit: Payer: Medicare Other

## 2020-06-22 ENCOUNTER — Telehealth: Payer: Self-pay | Admitting: *Deleted

## 2020-06-22 ENCOUNTER — Telehealth: Payer: Medicare Other | Admitting: Cardiology

## 2020-06-22 DIAGNOSIS — R2681 Unsteadiness on feet: Secondary | ICD-10-CM

## 2020-06-22 DIAGNOSIS — R2689 Other abnormalities of gait and mobility: Secondary | ICD-10-CM

## 2020-06-22 DIAGNOSIS — M6281 Muscle weakness (generalized): Secondary | ICD-10-CM

## 2020-06-22 NOTE — Therapy (Signed)
[place] MAIN [facility] SERVICES [address] [place], [place], [address] Phone: [phone]   Fax:  [phone]  Physical Therapy Treatment  Patient Details  Name: [name] MRN: [id number] Date of Birth: 1934-08-15 Referring Provider (PT): Meeler, Sherren Kerns FNP   Encounter Date: 06/22/2020   PT End of Session - 06/22/20 1041    Visit Number 2    Number of Visits 17    Date for PT Re-Evaluation 07/02/20    Authorization Type 2/10 PN 10/20    PT Start Time 0931    PT Stop Time 1019    PT Time Calculation (min) 48 min    Equipment Utilized During Treatment Gait belt    Activity Tolerance Patient tolerated treatment well;Patient limited by fatigue;Other (comment)    Behavior During Therapy WFL for tasks assessed/performed           Past Medical History:  Diagnosis Date  . AAA (abdominal aortic aneurysm) ([place]) 05/2019   [place] Rapid progression from June-Aug 2020 (4.6 - 5.4 cm): s/p EVAR (Dr. Lucky Cowboy [facility]): EVAR - 23 mm prox, 12 cm distal & x 18 cm length Gore Excluder Endoprosthesis Main body (via RFA distal to lowest Renal A); 62m x 14 cm L Contralateral Limb for L Iliac - extended with 8 mm x 6cm LifeStar stent (post-dilated with 7 mm DEB).  Additional R Iliac PTA not required.   . Arthritis   . Basal cell carcinoma of eyelid 01/03/2013   2019 - R side of Nose  . Benign neoplasm of ascending colon   . Benign neoplasm of descending colon   . Bilateral iliac artery stenosis ([place] 2007   Bilateral Iliac A stenting; extension of EVAR limbs into both Iliacs -- additional stent placed in L Iliac.  . Cancer ([place]    Skin cancer  . Colon polyps   . Complication of anesthesia    severe confusion agitation requiring hospital admission x 4 days  . Compression fracture of fourth lumbar vertebra ([place] 04/2019   - s/p Kyphoplasty; T12, L3-5 ([facility]  . COPD (chronic obstructive pulmonary disease) (HCC)    Centrilobular Emphysema  .  Coronary artery disease involving native coronary artery without angina pectoris 1998   - s/p CABG, occluded SVG-D1; patent LIMA-LAD, SVG-OM, SVG- RCA  . Dementia ([place]    memory loss - started noting late 2019  . Falls frequently    broke foot  . Fracture 2021   left foot  . GERD (gastroesophageal reflux disease)   . Hemorrhoid 02/10/2015  . History of hiatal hernia   . History of kidney stones   . HOH (hard of hearing)   . Hypertension   . Lewy body dementia ([place] 04/04/2020  . Macular degeneration disease   . Osteoporosis   . PAF (paroxysmal atrial fibrillation) ([place]    on Eliquis for OAC - Rate Control   . Prostate cancer ([place]   . Prostate disorder   . Sciatic pain    Chronic  . Sleep apnea    cpap  . Temporary cerebral vascular dysfunction 02/10/2015   Had negative work-up.  July, 2012.  No medication changes, had forgot them that day, got dehydrated.   . Urinary incontinence     Past Surgical History:  Procedure Laterality Date  . ABDOMINAL AORTIC ENDOVASCULAR STENT GRAFT Bilateral 05/18/2019   Procedure: ABDOMINAL AORTIC ENDOVASCULAR STENT GRAFT;  Surgeon: DAlgernon Huxley MD;  Location: [facility] ORS;  Service: Vascular;  Laterality:  Bilateral;  . ANKLE SURGERY Left    ORIF   . Arch aortogram and carotid aortogram  06/02/2006   Dr Oneida Alar did surgery  . BASAL CELL CARCINOMA EXCISION  04/2016   Dermatology  . CARDIAC CATHETERIZATION  01/30/1998    (Dr. Linard Millers): Native LAD & mRCA CTO. 90% D1. Mod Cx. SVG-D1 CTO. SVG-RCA & SVG-OM along with LIMA-LAD patent;  LV NORMAL Fxn  . CAROTID ENDARTERECTOMY Left Oct. 29, 2007   Dr. Oneida Alar:  . CATARACT EXTRACTION W/PHACO Right 03/05/2017   Procedure: CATARACT EXTRACTION PHACO AND INTRAOCULAR LENS PLACEMENT (IOC);  Surgeon: Leandrew Koyanagi, MD;  Location: [facility] ORS;  Service: Ophthalmology;  Laterality: Right;  [place] 00:58.5 AP% 10.6 CDE 6.20 Fluid Pack lot # [id number]  . CATARACT EXTRACTION W/PHACO Left 04/15/2017   Procedure:  CATARACT EXTRACTION PHACO AND INTRAOCULAR LENS PLACEMENT ([place])  Left;  Surgeon: Leandrew Koyanagi, MD;  Location: [place];  Service: Ophthalmology;  Laterality: Left;  . COLONOSCOPY WITH PROPOFOL N/A 09/15/2017   Procedure: COLONOSCOPY WITH PROPOFOL;  Surgeon: Lucilla Lame, MD;  Location: [facility] ENDOSCOPY;  Service: Endoscopy;  Laterality: N/A;  . COLONOSCOPY WITH PROPOFOL N/A 10/26/2018   Procedure: COLONOSCOPY WITH PROPOFOL;  Surgeon: Lucilla Lame, MD;  Location[facility]COPY;  Service: Endoscopy;  Laterality: N/A;  . CORONARY ARTERY BYPASS GRAFT  1998   LIMA-LAD, SVG-OM, SVG-RPDA, SVG-DIAG  . CPET / MET  12/2012   Mild chronotropic incompetence - read 82% of predicted; also reduced effort; peak VO2 15.7 / 75% (did not reach Max effort) -- suggested ischemic response in last 1.5 minutes of exercise. Normal pulmonary function on PFTs but poor response to  . CYSTOSCOPY W/ RETROGRADES Left 01/13/2020   Procedure: CYSTOSCOPY WITH RETROGRADE PYELOGRAM;  Surgeon: Billey Co, MD;  Location: [facility] ORS;  Service: Urology;  Laterality: Left;  . CYSTOSCOPY WITH STENT PLACEMENT Left 12/26/2019   Procedure: CYSTOSCOPY WITH STENT PLACEMENT;  Surgeon: Billey Co, MD;  Location: [facility] ORS;  Service: Urology;  Laterality: Left;  . CYSTOSCOPY/URETEROSCOPY/HOLMIUM LASER/STENT PLACEMENT Left 01/13/2020   Procedure: CYSTOSCOPY/URETEROSCOPY/HOLMIUM LASER/STENT EXCHANGE;  Surgeon: Billey Co, MD;  Location: [facility] ORS;  Service: Urology;  Laterality: Left;  . ENDOVASCULAR REPAIR/STENT GRAFT N/A 05/18/2019   Procedure: ENDOVASCULAR REPAIR/STENT GRAFT;  Surgeon: Algernon Huxley, MD;  Location: [facility] INVASIVE CV LAB;; EVAR - 23 mm prox, 12 cm distal & x 18 cm length Gore Excluder Endoprosthesis Main body (via RFA distal to lowest Renal A); 30m x 14 cm L Contralateral Limb for L Iliac - extended with 8 mm x 6cm LifeStar stent; no additional R Iliac PTA needed.   . ESOPHAGOGASTRODUODENOSCOPY (EGD) WITH  PROPOFOL N/A 09/15/2017   Procedure: ESOPHAGOGASTRODUODENOSCOPY (EGD) WITH PROPOFOL;  Surgeon: WLucilla Lame MD;  [facility];  Service: Endoscopy;  Laterality: N/A;  . FRACTURE SURGERY Right 03/19/2015   wrist  . FRACTURE SURGERY Left   . HIATAL HERNIA REPAIR    . ILIAC ARTERY STENT  12/15/2005   PTA and direct stenting rgt and lft common iliac arteries  . KYPHOPLASTY N/A 04/05/2019   Procedure: KYPHOPLASTY T12, L3, L4 L5;  Surgeon: MHessie Knows MD;  Location: [facility] ORS;  Service: Orthopedics;  Laterality: N/A;  . NM GATED MYOVIEW ([place])  05/'19; 8/'20   a) [facility]: Low Risk - no ischemia or infarct.  EF > 65%. no RWMA;; b) ([facility]: Normal wall motion.  No ischemia or infarction.  . OPEN REDUCTION INTERNAL FIXATION (ORIF) DISTAL RADIAL FRACTURE Left 01/13/2019  Procedure: OPEN REDUCTION INTERNAL FIXATION (ORIF) DISTAL  RADIAL FRACTURE - LEFT - SLEEP APNEA;  Surgeon: Hessie Knows, MD;  Location: [facility] ORS;  Service: Orthopedics;  Laterality: Left;  . ORIF ELBOW FRACTURE Right 01/01/2017   Procedure: OPEN REDUCTION INTERNAL FIXATION (ORIF) ELBOW/OLECRANON FRACTURE;  Surgeon: Hessie Knows, MD;  Location: [facility] ORS;  Service: Orthopedics;  Laterality: Right;  . ORIF WRIST FRACTURE Right 03/19/2015   Procedure: OPEN REDUCTION INTERNAL FIXATION (ORIF) WRIST FRACTURE;  Surgeon: Hessie Knows, MD;  Location: [facility] ORS;  Service: Orthopedics;  Laterality: Right;  . SHOULDER ARTHROSCOPY Right   . THORACIC AORTA - CAROTID ANGIOGRAM  October 2007   Dr. 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 TRANSTHORACIC ECHOCARDIOGRAM  February 2016   [facility]: Normal LV function. Dilated left atrium.  Domingo Dimes ECHOCARDIOGRAM  04/2019   [facility] April 20, 2019): EF 55-60 %.  Mild LVH.  Relatively normal valves.    There were no vitals filed for this visit.   Subjective Assessment - 06/22/20 1039     Subjective Patient presents to physical therapy with his significant other, she voices concern over his cognition this morning.    Pertinent History Patient is a pleasant 86 year old male who presents to PT for his LSVT eval with his significant other. Patient has been seen by this therapist in the past for balance, back pain, and strength. History of hallucinations, kidney stones, kyphoplasty T12, L3, L4, L5 performed in August 2020 by Dr. Rudene Christians. MRI from [facility] dated 03/28/2019 report was reviewed today. Acute compression fractures are noted at T12, L3, and L4.PMH includes AAA, arthritis, atherosclerotic heart disease (s/p CABG), benign neoplasm of ascending/descending colon, COPD, CAD, dementia, dysrhythmia, falls, GERD, hemorrhoid, HOH, HTN, PAF, prostate cancer, osteoporosis, kidney stones, macular degeneration, Lewy Body Dementia.    Limitations Walking;Standing;House hold activities;Other (comment);Lifting;Sitting    How long can you sit comfortably? n/a    How long can you stand comfortably? needs a walker now, unsteady once standing    How long can you walk comfortably? uses a walker now; can walk in house.    Diagnostic tests MRI from [facility] dated 03/28/2019 report was reviewed today. Acute compression fractures are noted at T12, L3, and L4. He has facet arthropathy at the lower lumbar levels. At L3-4 he has moderate central stenosis.    Patient Stated Goals be more independent with ADLs and mobility    Currently in Pain? No/denies                 Treatment: Patient seen for LSVT Daily Session Maximal Daily Exercises for facilitation/coordination of movement Sustained movements are designed to rescale the amplitude of movement output for generalization to daily functional activities .Performed as follows for 1 set of 10 repetitions each multidirectional sustained movements  1) Floor to ceiling , cues to hold for 10 seconds, needs modeling  for correct positions, VC to reach out furtherand to reach up to the ceiling; 8x 2) Side to side multidirectional Repetitive movements performed in sittingand are designed to provide retraining effort needed for sustained muscle activation in tasks, cues to lean fwd and hold position x 10 counts: Patient does not movesideways welland is not able to transition or stretch out leg very far; 8x each side; more challenging to R side 3) Step and reach forward , cues for good knee flex, cues to move UE and LEwith  tactilecues required to bring arm into abduction andto rotate arms up to pronate his forearms 4) Step and reach backwards , cues for BUE back, getting toe up and bending back knee 5) Step and reach sideways, cues to turn head sideways, and turn head back to neutral, and to rotate arms and pronate forearms 6) Rock and reach forward/backward , cues to reach far fwd with UE's, patient not comfortable with feet apart, not rocking very far to get a good weight shift, not able to raise heel or raise toes much 7) Rock and reach sideways, cues for twist and look behind, only rotates side ways, unable to twistfeet at this time.  functional component task with supervision 5 reps and simulated activities for: 1. Sit to standx5needs cues to begin with UE up for correct start position 2.enter/exitroom 5x.  3.putting on/off coat 5x, requires assistance only once 4. Play cards go fish for coordination, pattern recognition, memory, reaching, fine motor skills  5.Large step with rollatorand upright posturex5 each LE Patient performed with instruction, verbal cues, tactile cues of therapist: goal:increase tissue extensibility, promote proper posture, improve mobility Ambulate200 ft with cues for big step and foot clearance, with improved foot clearance with contact cue of BIG. With rollator and close CGA;walking into/out of rooms and into/out of hallways, negotiating obstacles and  people.   Homework: given on paper for weekend and today.   Pt presents with slowness of movements and fatigues with therapeutic exercises. Patient needs verbal, tactile and modeling cues with LSVT BIG exercises. Patient demonstrates difficulty with remembering to hold the seated exercises and the start and finish positions. Patient tolerated all interventions well this date and will benefit from continued skilled PT interventions to improve strength and balance and decrease risk of falling                       PT Education - 06/22/20 1040    Education provided Yes    Education Details exercise technique, body mechanics    Person(s) Educated Patient    Methods Explanation;Demonstration;Tactile cues;Verbal cues    Comprehension Verbalized understanding;Returned demonstration;Verbal cues required;Tactile cues required            PT Short Term Goals - 06/20/20 1122      PT SHORT TERM GOAL #1   Title Patient will be independent in home exercise program to improve strength/mobility for better functional independence with ADLs.    Baseline 10/4: will start program tomorrow 10/20: performing but requires assistance    Time 2    Period Weeks    Status Partially Met    Target Date 07/02/20             PT Long Term Goals - 06/20/20 1123      PT LONG TERM GOAL #1   Title Patient will increase FOTO score to equal to or greater than 52% to demonstrate statistically significant improvement in mobility and quality of life.    Baseline 10/4: 47.3% 10/20: 63.7%    Time 4    Period Weeks    Status Achieved      PT LONG TERM GOAL #2   Title Patient will demonstrate an improved Berg Balance Score of > 41/56 as to demonstrate improved balance with ADLs such as sitting/standing and transfer balance and reduced fall risk.    Baseline 10/4: 35/56 10/20: 42/56    Time 4    Period Weeks    Status Achieved  PT LONG TERM GOAL #3   Title Client will be able to get up  from soft surfaces, including couch and reclinerin his home confidently on his 1st trial 100% of the time to allow him to safely access all areas of his home.    Baseline 10/4: needs help to get up 10/20: able to perform 50-60% of the time    Time 4    Period Weeks    Status Partially Met    Target Date 07/02/20      PT LONG TERM GOAL #4   Title Patient will improve his functional mobility be decreasing his cognitive TUG to < 15 seconds for improved mobility, stability, and safety with dual task to decrease fall risk.    Baseline 10/4: unable to perform cognitive aspect of test ( 29.19) 10/20: unable to perform cognitive portion 17.4 seconds wiht standard portion    Time 4    Period Weeks    Status Partially Met    Target Date 07/02/20                 Plan - 06/22/20 1043    Clinical Impression Statement Patient presents with higher levels of confusion this morning than normal. Is not orientated to place, confused on people recognition. Is pleasant though despite confusion this session. Prognosis is guarded due to limited carryover and cognitive decline noted through the past weeks. Significant other is aware of cognitive decline and only one week left in program. Session terminated early when patient became adamant about going home and becoming agitated. Patient will benefit from continued skilled physical therapy in the LSVT program to continue to progress toward improved balance and reduced need for cues to sustain normalized amplitude with mobility and functional tasks.    Personal Factors and Comorbidities Age;Comorbidity 3+;Fitness;Past/Current Experience;Sex;Social Background;Time since onset of injury/illness/exacerbation;Transportation    Comorbidities AAA, arthritis, atherosclerotic heart disease, (s/p CABG), benign neoplasm of ascending/descending colon, COPD, CAD, dementia, dysrhythmia, frequent falls, GERD, hemorrhoid, HOH, HTN, PAF, prostate cancer.    Examination-Activity  Limitations Bathing;Bend;Carry;Dressing;Continence;Stairs;Squat;Reach Overhead;Locomotion Level;Lift;Stand;Transfers;Toileting;Bed Mobility    Examination-Participation Restrictions Church;Cleaning;Community Activity;Driving;Interpersonal Relationship;Laundry;Volunteer;Shop;Personal Finances;Meal Prep;Yard Work    [employer] Evolving/Moderate complexity    Rehab Potential Fair    PT Frequency 4x / week    PT Duration 4 weeks    PT Treatment/Interventions ADLs/Self Care Home Management;Aquatic Therapy;Cryotherapy;Moist Heat;Traction;Ultrasound;Therapeutic activities;Functional mobility training;Stair training;Gait training;DME Instruction;Therapeutic exercise;Balance training;Neuromuscular re-education;Patient/family education;Manual techniques;Passive range of motion;Energy conservation;Vestibular;Taping;Biofeedback;Electrical Stimulation;Iontophoresis 9m/ml Dexamethasone;Dry needling;Visual/perceptual remediation/compensation;Cognitive remediation    PT Next Visit Plan LSVT    Consulted and Agree with Plan of Care Patient;Family member/caregiver    Family Member Consulted significant other           Patient will benefit from skilled therapeutic intervention in order to improve the following deficits and impairments:  Abnormal gait, Cardiopulmonary status limiting activity, Decreased activity tolerance, Decreased balance, Decreased knowledge of precautions, Decreased endurance, Decreased coordination, Decreased cognition, Decreased knowledge of use of DME, Decreased mobility, Difficulty walking, Decreased safety awareness, Decreased strength, Impaired flexibility, Impaired perceived functional ability, Impaired sensation, Postural dysfunction, Improper body mechanics, Pain, Impaired vision/preception, Decreased range of motion  Visit Diagnosis: Unsteadiness on feet  Other abnormalities of gait and mobility  Muscle weakness (generalized)     Problem List Patient  Active Problem List   Diagnosis Date Noted  . LBD (Lewy body dementia) ([place] 05/14/2020  . Hematuria   . Acute kidney injury superimposed on CKD ([place]   . Sepsis due to gram-negative UTI (  [place]) 12/26/2019  . Left nephrolithiasis 12/26/2019  . Hydronephrosis, left 12/26/2019  . Status post abdominal aortic aneurysm (AAA) repair 04/15/2019  . Malignant neoplasm of descending colon ([place])   . Do not intubate, cardiopulmonary resuscitation (CPR)-only code status 10/08/2018  . Prostate cancer ([place]) 02/25/2018  . Prostatic cyst 01/15/2018  . 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 ([place]) 09/15/2017  . Blood in stool   . Abnormal feces   . Stricture and stenosis of esophagus   . Mild cognitive impairment 08/18/2017  . Senile purpura ([place]) 04/29/2017  . Anemia 04/29/2017  . Frequent falls 04/29/2017  . Simple chronic bronchitis ([place]) 12/17/2016  . Benign localized hyperplasia of prostate with urinary obstruction 07/15/2016  . Elevated PSA 07/15/2016  . Incomplete emptying of bladder 07/15/2016  . Urge incontinence 07/15/2016  . Hypothyroidism 04/25/2016  . Macular degeneration 04/25/2016  . Erectile dysfunction due to arterial insufficiency   . Ischemic heart disease with chronotropic incompetence   . CN (constipation) 02/10/2015  . DD (diverticular disease) 02/10/2015  . Weakness 02/10/2015  . Lichen planus 76/19/5093  . Restless leg 02/10/2015  . 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 B12 deficiency 02/10/2015  . COPD (chronic obstructive pulmonary disease) ([place]) 11/14/2014  . Post Inflammatory Lung Changes 11/14/2014  . Acute delirium 11/08/2014  . PAF (paroxysmal atrial fibrillation) (HCC) CHA2DS2-VASc = 4. AC = Eliquis   . Insomnia 12/09/2013  . OSA on CPAP   . Dyspnea on exertion - -essentially resolved with BB dose reduction & wgt loss 03/29/2013  . Overweight (BMI 25.0-29.9) -- Wgt back up 01/17/2013  . Atherosclerotic heart disease of native coronary artery without angina pectoris  - s/p CABG, occluded SVG-D1; patent LIMA-LAD, SVG-OM, SVG- RCA   . Essential hypertension   . Hyperlipidemia with target LDL less than 70   . Aorto-iliac disease ([place])   . BCC (basal cell carcinoma), eyelid 01/03/2013  . Allergic rhinitis 08/17/2009  . Adaptation reaction 05/28/2009  . Acid reflux 05/28/2009  . Arthritis, degenerative 05/28/2009  . 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 Carotid artery occlusion without infarction 06/01/2006  . PAD (peripheral artery disease) - bilateral common iliac stents 12/15/2005  . Atherosclerotic heart disease of artery bypass graft 09/01/1997   Janna Arch, PT, DPT   06/22/2020, 10:47 AM  [place] MAIN [facility] SERVICES [address] [place], [place], [address] Phone: [phone]   Fax:  [phone]  Name: [name] MRN: [id number] Date of Birth: Feb 28, 1934

## 2020-06-22 NOTE — Telephone Encounter (Signed)
Called to speak to patient and [name] . In regards to starting virtual visit

## 2020-06-25 ENCOUNTER — Ambulatory Visit: Payer: Medicare Other

## 2020-06-25 ENCOUNTER — Encounter: Payer: 59 | Admitting: Speech Pathology

## 2020-06-25 ENCOUNTER — Other Ambulatory Visit: Payer: Self-pay

## 2020-06-25 DIAGNOSIS — M6281 Muscle weakness (generalized): Secondary | ICD-10-CM

## 2020-06-25 DIAGNOSIS — R2689 Other abnormalities of gait and mobility: Secondary | ICD-10-CM

## 2020-06-25 DIAGNOSIS — R2681 Unsteadiness on feet: Secondary | ICD-10-CM

## 2020-06-25 NOTE — Therapy (Signed)
[place] MAIN [facility] SERVICES [address] [place], [place], [address] Phone: [phone]   Fax:  [phone]  Physical Therapy Treatment  Patient Details  Name: [name] MRN: [id number] Date of Birth: 02-24-1934 Referring Provider (PT): Meeler, Sherren Kerns FNP   Encounter Date: 06/25/2020   PT End of Session - 06/25/20 1444    Visit Number 3    Number of Visits 17    Date for PT Re-Evaluation 07/02/20    Authorization Type 3/10 PN 10/20    PT Start Time 1000    PT Stop Time 1057    PT Time Calculation (min) 57 min    Equipment Utilized During Treatment Gait belt    Activity Tolerance Patient tolerated treatment well;Patient limited by fatigue;Other (comment)    Behavior During Therapy WFL for tasks assessed/performed           Past Medical History:  Diagnosis Date  . AAA (abdominal aortic aneurysm) ([place]) 05/2019   [place] Rapid progression from June-Aug 2020 (4.6 - 5.4 cm): s/p EVAR (Dr. Lucky Cowboy [facility]): EVAR - 23 mm prox, 12 cm distal & x 18 cm length Gore Excluder Endoprosthesis Main body (via RFA distal to lowest Renal A); 33m x 14 cm L Contralateral Limb for L Iliac - extended with 8 mm x 6cm LifeStar stent (post-dilated with 7 mm DEB).  Additional R Iliac PTA not required.   . Arthritis   . Basal cell carcinoma of eyelid 01/03/2013   2019 - R side of Nose  . Benign neoplasm of ascending colon   . Benign neoplasm of descending colon   . Bilateral iliac artery stenosis ([place] 2007   Bilateral Iliac A stenting; extension of EVAR limbs into both Iliacs -- additional stent placed in L Iliac.  . Cancer ([place]    Skin cancer  . Colon polyps   . Complication of anesthesia    severe confusion agitation requiring hospital admission x 4 days  . Compression fracture of fourth lumbar vertebra ([place] 04/2019   - s/p Kyphoplasty; T12, L3-5 ([facility]  . COPD (chronic obstructive pulmonary disease) (HCC)    Centrilobular Emphysema  .  Coronary artery disease involving native coronary artery without angina pectoris 1998   - s/p CABG, occluded SVG-D1; patent LIMA-LAD, SVG-OM, SVG- RCA  . Dementia ([place]    memory loss - started noting late 2019  . Falls frequently    broke foot  . Fracture 2021   left foot  . GERD (gastroesophageal reflux disease)   . Hemorrhoid 02/10/2015  . History of hiatal hernia   . History of kidney stones   . HOH (hard of hearing)   . Hypertension   . Lewy body dementia ([place] 04/04/2020  . Macular degeneration disease   . Osteoporosis   . PAF (paroxysmal atrial fibrillation) ([place]    on Eliquis for OAC - Rate Control   . Prostate cancer ([place]   . Prostate disorder   . Sciatic pain    Chronic  . Sleep apnea    cpap  . Temporary cerebral vascular dysfunction 02/10/2015   Had negative work-up.  July, 2012.  No medication changes, had forgot them that day, got dehydrated.   . Urinary incontinence     Past Surgical History:  Procedure Laterality Date  . ABDOMINAL AORTIC ENDOVASCULAR STENT GRAFT Bilateral 05/18/2019   Procedure: ABDOMINAL AORTIC ENDOVASCULAR STENT GRAFT;  Surgeon: DAlgernon Huxley MD;  Location: [facility] ORS;  Service: Vascular;  Laterality:  Bilateral;  . ANKLE SURGERY Left    ORIF   . Arch aortogram and carotid aortogram  06/02/2006   Dr Oneida Alar did surgery  . BASAL CELL CARCINOMA EXCISION  04/2016   Dermatology  . CARDIAC CATHETERIZATION  01/30/1998    (Dr. Linard Millers): Native LAD & mRCA CTO. 90% D1. Mod Cx. SVG-D1 CTO. SVG-RCA & SVG-OM along with LIMA-LAD patent;  LV NORMAL Fxn  . CAROTID ENDARTERECTOMY Left Oct. 29, 2007   Dr. Oneida Alar:  . CATARACT EXTRACTION W/PHACO Right 03/05/2017   Procedure: CATARACT EXTRACTION PHACO AND INTRAOCULAR LENS PLACEMENT (IOC);  Surgeon: Leandrew Koyanagi, MD;  Location: [facility] ORS;  Service: Ophthalmology;  Laterality: Right;  [place] 00:58.5 AP% 10.6 CDE 6.20 Fluid Pack lot # [id number]  . CATARACT EXTRACTION W/PHACO Left 04/15/2017   Procedure:  CATARACT EXTRACTION PHACO AND INTRAOCULAR LENS PLACEMENT ([place])  Left;  Surgeon: Leandrew Koyanagi, MD;  Location: [place];  Service: Ophthalmology;  Laterality: Left;  . COLONOSCOPY WITH PROPOFOL N/A 09/15/2017   Procedure: COLONOSCOPY WITH PROPOFOL;  Surgeon: Lucilla Lame, MD;  Location: [facility] ENDOSCOPY;  Service: Endoscopy;  Laterality: N/A;  . COLONOSCOPY WITH PROPOFOL N/A 10/26/2018   Procedure: COLONOSCOPY WITH PROPOFOL;  Surgeon: Lucilla Lame, MD;  Location: [facility] ENDOSCOPY;  Service: Endoscopy;  Laterality: N/A;  . CORONARY ARTERY BYPASS GRAFT  1998   LIMA-LAD, SVG-OM, SVG-RPDA, SVG-DIAG  . CPET / MET  12/2012   Mild chronotropic incompetence - read 82% of predicted; also reduced effort; peak VO2 15.7 / 75% (did not reach Max effort) -- suggested ischemic response in last 1.5 minutes of exercise. Normal pulmonary function on PFTs but poor response to  . CYSTOSCOPY W/ RETROGRADES Left 01/13/2020   Procedure: CYSTOSCOPY WITH RETROGRADE PYELOGRAM;  Surgeon: Billey Co, MD;  Location: [facility] ORS;  Service: Urology;  Laterality: Left;  . CYSTOSCOPY WITH STENT PLACEMENT Left 12/26/2019   Procedure: CYSTOSCOPY WITH STENT PLACEMENT;  Surgeon: Billey Co, MD;  Location: [facility] ORS;  Service: Urology;  Laterality: Left;  . CYSTOSCOPY/URETEROSCOPY/HOLMIUM LASER/STENT PLACEMENT Left 01/13/2020   Procedure: CYSTOSCOPY/URETEROSCOPY/HOLMIUM LASER/STENT EXCHANGE;  Surgeon: Billey Co, MD;  Location: [facility] ORS;  Service: Urology;  Laterality: Left;  . ENDOVASCULAR REPAIR/STENT GRAFT N/A 05/18/2019   Procedure: ENDOVASCULAR REPAIR/STENT GRAFT;  Surgeon: Algernon Huxley, MD;  Location: [facility] INVASIVE CV LAB;; EVAR - 23 mm prox, 12 cm distal & x 18 cm length Gore Excluder Endoprosthesis Main body (via RFA distal to lowest Renal A); 32m x 14 cm L Contralateral Limb for L Iliac - extended with 8 mm x 6cm LifeStar stent; no additional R Iliac PTA needed.   . ESOPHAGOGASTRODUODENOSCOPY (EGD) WITH  PROPOFOL N/A 09/15/2017   Procedure: ESOPHAGOGASTRODUODENOSCOPY (EGD) WITH PROPOFOL;  Surgeon: WLucilla Lame MD;  [facility];  Service: Endoscopy;  Laterality: N/A;  . FRACTURE SURGERY Right 03/19/2015   wrist  . FRACTURE SURGERY Left   . HIATAL HERNIA REPAIR    . ILIAC ARTERY STENT  12/15/2005   PTA and direct stenting rgt and lft common iliac arteries  . KYPHOPLASTY N/A 04/05/2019   Procedure: KYPHOPLASTY T12, L3, L4 L5;  Surgeon: MHessie Knows MD;  Location: [facility] ORS;  Service: Orthopedics;  Laterality: N/A;  . NM GATED MYOVIEW ([place])  05/'19; 8/'20   a) [facility]: Low Risk - no ischemia or infarct.  EF > 65%. no RWMA;; b) ([facility]: Normal wall motion.  No ischemia or infarction.  . OPEN REDUCTION INTERNAL FIXATION (ORIF) DISTAL RADIAL FRACTURE Left 01/13/2019  Procedure: OPEN REDUCTION INTERNAL FIXATION (ORIF) DISTAL  RADIAL FRACTURE - LEFT - SLEEP APNEA;  Surgeon: Hessie Knows, MD;  Location: [facility] ORS;  Service: Orthopedics;  Laterality: Left;  . ORIF ELBOW FRACTURE Right 01/01/2017   Procedure: OPEN REDUCTION INTERNAL FIXATION (ORIF) ELBOW/OLECRANON FRACTURE;  Surgeon: Hessie Knows, MD;  Location: [facility] ORS;  Service: Orthopedics;  Laterality: Right;  . ORIF WRIST FRACTURE Right 03/19/2015   Procedure: OPEN REDUCTION INTERNAL FIXATION (ORIF) WRIST FRACTURE;  Surgeon: Hessie Knows, MD;  Location: [facility] ORS;  Service: Orthopedics;  Laterality: Right;  . SHOULDER ARTHROSCOPY Right   . THORACIC AORTA - CAROTID ANGIOGRAM  October 2007   Dr. 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 TRANSTHORACIC ECHOCARDIOGRAM  February 2016   [facility]: Normal LV function. Dilated left atrium.  Domingo Dimes ECHOCARDIOGRAM  04/2019   [facility] April 20, 2019): EF 55-60 %.  Mild LVH.  Relatively normal valves.    There were no vitals filed for this visit.   Subjective Assessment - 06/25/20 1443     Subjective Patient presents to physical therapy with son. Reports he rested yesterday and feels more refreshed today.    Pertinent History Patient is a pleasant 86 year old male who presents to PT for his LSVT eval with his significant other. Patient has been seen by this therapist in the past for balance, back pain, and strength. History of hallucinations, kidney stones, kyphoplasty T12, L3, L4, L5 performed in August 2020 by Dr. Rudene Christians. [facility] dated 03/28/2019 report was reviewed today. Acute compression fractures are noted at T12, L3, and L4.PMH includes AAA, arthritis, atherosclerotic heart disease (s/p CABG), benign neoplasm of ascending/descending colon, COPD, CAD, dementia, dysrhythmia, falls, GERD, hemorrhoid, HOH, HTN, PAF, prostate cancer, osteoporosis, kidney stones, macular degeneration, Lewy Body Dementia.    Limitations Walking;Standing;House hold activities;Other (comment);Lifting;Sitting    How long can you sit comfortably? n/a    How long can you stand comfortably? needs a walker now, unsteady once standing    How long can you walk comfortably? uses a walker now; can walk in house.    Diagnostic tests MRI from [facility] dated 03/28/2019 report was reviewed today. Acute compression fractures are noted at T12, L3, and L4. He has facet arthropathy at the lower lumbar levels. At L3-4 he has moderate central stenosis.    Patient Stated Goals be more independent with ADLs and mobility    Currently in Pain? No/denies                 Treatment: Patient seen for LSVT Daily Session Maximal Daily Exercises for facilitation/coordination of movement Sustained movements are designed to rescale the amplitude of movement output for generalization to daily functional activities .Performed as follows for 1 set of 10 repetitions each multidirectional sustained movements  1) Floor to ceiling , cues to hold for 10 seconds, needs modeling for correct  positions, VC to reach out furtherand to reach up to the ceiling; 8x 2) Side to side multidirectional Repetitive movements performed in sittingand are designed to provide retraining effort needed for sustained muscle activation in tasks, cues to lean fwd and hold position x 10 counts: Patient does not movesideways welland is not able to transition or stretch out leg very far; 8x each side; more challenging to R side 3) Step and reach forward , cues for good knee flex, cues to move UE and LEwith tactilecues  required to bring arm into abduction andto rotate arms up to pronate his forearms 4) Step and reach backwards , cues for BUE back, getting toe up and bending back knee 5) Step and reach sideways, cues to turn head sideways, and turn head back to neutral, and to rotate arms and pronate forearms 6) Rock and reach forward/backward , cues to reach far fwd with UE's, patient not comfortable with feet apart, not rocking very far to get a good weight shift, not able to raise heel or raise toes much 7) Rock and reach sideways, cues for twist and look behind, only rotates side ways, unable to twistfeet at this time.  functional component task with supervision 5 reps and simulated activities for: 1. Sit to standx5needs cues to begin with UE up for correct start position 2.enter/exitroom 5x.  3.putting on/off coat 5x, requires assistance only once 4. Play cards go fish for coordination, pattern recognition, memory, reaching, fine motor skills  5.Large step with rollatorand upright posturex5 each LE 6. Put 5 plates into/out of cabinet, open up cabinet and lock rollator, pick up and sort bowls.  Patient performed with instruction, verbal cues, tactile cues of therapist: goal:increase tissue extensibility, promote proper posture, improve mobility Ambulate>1000 ft with cues for big step and foot clearance, with improved foot clearance with contact cue of BIG. With rollator and  close CGA;walking into/out of rooms and into/out of hallways, negotiating obstacles and people. Two seated rest breaks   Homework: assist in kitchen for snack or lunch  Pt presents with slowness of movements and fatigues with therapeutic exercises. Patient needs verbal, tactile and modeling cues with LSVT BIG exercises. Patient demonstrates difficulty with remembering to hold the seated exercises and the start and finish positions. Patient tolerated all interventions well this date and will benefit from continued skilled PT interventions to improve strength and balance and decrease risk of falling                     PT Education - 06/25/20 1443    Education provided Yes    Education Details exercise technique, body mechanics, LSVT    Person(s) Educated Patient;Child(ren)    Methods Explanation;Demonstration;Tactile cues;Verbal cues;Handout    Comprehension Verbalized understanding;Returned demonstration;Verbal cues required;Tactile cues required;Need further instruction            PT Short Term Goals - 06/20/20 1122      PT SHORT TERM GOAL #1   Title Patient will be independent in home exercise program to improve strength/mobility for better functional independence with ADLs.    Baseline 10/4: will start program tomorrow 10/20: performing but requires assistance    Time 2    Period Weeks    Status Partially Met    Target Date 07/02/20             PT Long Term Goals - 06/20/20 1123      PT LONG TERM GOAL #1   Title Patient will increase FOTO score to equal to or greater than 52% to demonstrate statistically significant improvement in mobility and quality of life.    Baseline 10/4: 47.3% 10/20: 63.7%    Time 4    Period Weeks    Status Achieved      PT LONG TERM GOAL #2   Title Patient will demonstrate an improved Berg Balance Score of > 41/56 as to demonstrate improved balance with ADLs such as sitting/standing and transfer balance and reduced fall  risk.    Baseline 10/4:  35/56 10/20: 42/56    Time 4    Period Weeks    Status Achieved      PT LONG TERM GOAL #3   Title Client will be able to get up from soft surfaces, including couch and reclinerin his home confidently on his 1st trial 100% of the time to allow him to safely access all areas of his home.    Baseline 10/4: needs help to get up 10/20: able to perform 50-60% of the time    Time 4    Period Weeks    Status Partially Met    Target Date 07/02/20      PT LONG TERM GOAL #4   Title Patient will improve his functional mobility be decreasing his cognitive TUG to < 15 seconds for improved mobility, stability, and safety with dual task to decrease fall risk.    Baseline 10/4: unable to perform cognitive aspect of test ( 29.19) 10/20: unable to perform cognitive portion 17.4 seconds wiht standard portion    Time 4    Period Weeks    Status Partially Met    Target Date 07/02/20                 Plan - 06/25/20 1502    Clinical Impression Statement Patient continues to require constant visual cueing/demonstration for task performance each daily exercise indicating limited carryover between sessions and guarded/poor prognosis. Patient is improving in capacity for ambulation with decreasing episodes of LOB with mobility. Reaching into/out of cabinets is improving with decreased dropping of objects. Patient will benefit from continued skilled physical therapy in the LSVT program to continue to progress toward improved balance and reduced need for cues to sustain normalized amplitude with mobility and functional tasks    Personal Factors and Comorbidities Age;Comorbidity 3+;Fitness;Past/Current Experience;Sex;Social Background;Time since onset of injury/illness/exacerbation;Transportation    Comorbidities AAA, arthritis, atherosclerotic heart disease, (s/p CABG), benign neoplasm of ascending/descending colon, COPD, CAD, dementia, dysrhythmia, frequent falls, GERD, hemorrhoid, HOH,  HTN, PAF, prostate cancer.    Examination-Activity Limitations Bathing;Bend;Carry;Dressing;Continence;Stairs;Squat;Reach Overhead;Locomotion Level;Lift;Stand;Transfers;Toileting;Bed Mobility    Examination-Participation Restrictions Church;Cleaning;Community Activity;Driving;Interpersonal Relationship;Laundry;Volunteer;Shop;Personal Finances;Meal Prep;Yard Work    [employer] Evolving/Moderate complexity    Rehab Potential Fair    PT Frequency 4x / week    PT Duration 4 weeks    PT Treatment/Interventions ADLs/Self Care Home Management;Aquatic Therapy;Cryotherapy;Moist Heat;Traction;Ultrasound;Therapeutic activities;Functional mobility training;Stair training;Gait training;DME Instruction;Therapeutic exercise;Balance training;Neuromuscular re-education;Patient/family education;Manual techniques;Passive range of motion;Energy conservation;Vestibular;Taping;Biofeedback;Electrical Stimulation;Iontophoresis 3m/ml Dexamethasone;Dry needling;Visual/perceptual remediation/compensation;Cognitive remediation    PT Next Visit Plan LSVT    Consulted and Agree with Plan of Care Patient;Family member/caregiver    Family Member Consulted significant other           Patient will benefit from skilled therapeutic intervention in order to improve the following deficits and impairments:  Abnormal gait, Cardiopulmonary status limiting activity, Decreased activity tolerance, Decreased balance, Decreased knowledge of precautions, Decreased endurance, Decreased coordination, Decreased cognition, Decreased knowledge of use of DME, Decreased mobility, Difficulty walking, Decreased safety awareness, Decreased strength, Impaired flexibility, Impaired perceived functional ability, Impaired sensation, Postural dysfunction, Improper body mechanics, Pain, Impaired vision/preception, Decreased range of motion  Visit Diagnosis: Unsteadiness on feet  Other abnormalities of gait and mobility  Muscle  weakness (generalized)     Problem List Patient Active Problem List   Diagnosis Date Noted  . LBD (Lewy body dementia) ([place] 05/14/2020  . Hematuria   . Acute kidney injury superimposed on CKD (HG[place]   . Sepsis due to gram-negative UTI ([place]  12/26/2019  . Left nephrolithiasis 12/26/2019  . Hydronephrosis, left 12/26/2019  . Status post abdominal aortic aneurysm (AAA) repair 04/15/2019  . Malignant neoplasm of descending colon ([place])   . Do not intubate, cardiopulmonary resuscitation (CPR)-only code status 10/08/2018  . Prostate cancer ([place]) 02/25/2018  . Prostatic cyst 01/15/2018  . 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 ([place]) 09/15/2017  . Blood in stool   . Abnormal feces   . Stricture and stenosis of esophagus   . Mild cognitive impairment 08/18/2017  . Senile purpura ([place]) 04/29/2017  . Anemia 04/29/2017  . Frequent falls 04/29/2017  . Simple chronic bronchitis ([place]) 12/17/2016  . Benign localized hyperplasia of prostate with urinary obstruction 07/15/2016  . Elevated PSA 07/15/2016  . Incomplete emptying of bladder 07/15/2016  . Urge incontinence 07/15/2016  . Hypothyroidism 04/25/2016  . Macular degeneration 04/25/2016  . Erectile dysfunction due to arterial insufficiency   . Ischemic heart disease with chronotropic incompetence   . CN (constipation) 02/10/2015  . DD (diverticular disease) 02/10/2015  . Weakness 02/10/2015  . Lichen planus 31/25/0871  . Restless leg 02/10/2015  . 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 B12 deficiency 02/10/2015  . COPD (chronic obstructive pulmonary disease) ([place]) 11/14/2014  . Post Inflammatory Lung Changes 11/14/2014  . Acute delirium 11/08/2014  . PAF (paroxysmal atrial fibrillation) (HCC) CHA2DS2-VASc = 4. AC = Eliquis   . Insomnia 12/09/2013  . OSA on CPAP   . Dyspnea on exertion - -essentially resolved with BB dose reduction & wgt loss 03/29/2013  . Overweight (BMI 25.0-29.9) -- Wgt back up 01/17/2013  . Atherosclerotic heart  disease of native coronary artery without angina pectoris - s/p CABG, occluded SVG-D1; patent LIMA-LAD, SVG-OM, SVG- RCA   . Essential hypertension   . Hyperlipidemia with target LDL less than 70   . Aorto-iliac disease ([place])   . BCC (basal cell carcinoma), eyelid 01/03/2013  . Allergic rhinitis 08/17/2009  . Adaptation reaction 05/28/2009  . Acid reflux 05/28/2009  . Arthritis, degenerative 05/28/2009  . 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 Carotid artery occlusion without infarction 06/01/2006  . PAD (peripheral artery disease) - bilateral common iliac stents 12/15/2005  . Atherosclerotic heart disease of artery bypass graft 09/01/1997   Janna Arch, PT, DPT   06/25/2020, 3:04 PM  [place] MAIN [facility] SERVICES [address] [place], [place], [address] Phone: [phone]   Fax:  [phone]  Name: JOANDY BURGET MRN: [id number] Date of Birth: 1934-08-18

## 2020-06-26 ENCOUNTER — Encounter: Payer: 59 | Admitting: Speech Pathology

## 2020-06-26 ENCOUNTER — Ambulatory Visit: Payer: Medicare Other

## 2020-06-26 ENCOUNTER — Other Ambulatory Visit: Payer: Self-pay

## 2020-06-26 DIAGNOSIS — M6281 Muscle weakness (generalized): Secondary | ICD-10-CM

## 2020-06-26 DIAGNOSIS — R2681 Unsteadiness on feet: Secondary | ICD-10-CM

## 2020-06-26 DIAGNOSIS — R2689 Other abnormalities of gait and mobility: Secondary | ICD-10-CM

## 2020-06-26 NOTE — Therapy (Signed)
[name] SERVICES [address] [place], [place], [address] Phone: [phone]   Fax:  [phone]  Physical Therapy Treatment  Patient Details  Name: [name] MRN: [id number] Date of Birth: June 14, 1934 Referring Provider (PT): Meeler, Sherren Kerns FNP   Encounter Date: 06/26/2020   PT End of Session - 06/26/20 1327    Visit Number 14    Number of Visits 17    Date for PT Re-Evaluation 07/02/20    Authorization Type 4/10 PN 10/20    PT Start Time 1100    PT Stop Time 1156    PT Time Calculation (min) 56 min    Equipment Utilized During Treatment Gait belt    Activity Tolerance Patient tolerated treatment well;Patient limited by fatigue;Other (comment)    Behavior During Therapy WFL for tasks assessed/performed           Past Medical History:  Diagnosis Date  . AAA (abdominal aortic aneurysm) ([place]) 05/2019   [place] Rapid progression from June-Aug 2020 (4.6 - 5.4 cm): s/p EVAR ([facility]): EVAR - 23 mm prox, 12 cm distal & x 18 cm length Gore Excluder Endoprosthesis Foster body (via RFA distal to lowest Renal A); 57m x 14 cm L Contralateral Limb for L Iliac - extended with 8 mm x 6cm LifeStar stent (post-dilated with 7 mm DEB).  Additional R Iliac PTA not required.   . Arthritis   . Basal cell carcinoma of eyelid 01/03/2013   2019 - R side of Nose  . Benign neoplasm of ascending colon   . Benign neoplasm of descending colon   . Bilateral iliac artery stenosis ([place] 2007   Bilateral Iliac A stenting; extension of EVAR limbs into both Iliacs -- additional stent placed in L Iliac.  . Cancer ([place]    Skin cancer  . Colon polyps   . Complication of anesthesia    severe confusion agitation requiring hospital admission x 4 days  . Compression fracture of fourth lumbar vertebra ([place] 04/2019   - s/p Kyphoplasty; T12, L3-5 ([facility]  . COPD (chronic obstructive pulmonary disease) (HCC)    Centrilobular Emphysema  .  Coronary artery disease involving native coronary artery without angina pectoris 1998   - s/p CABG, occluded SVG-D1; patent LIMA-LAD, SVG-OM, SVG- RCA  . Dementia ([place]    memory loss - started noting late 2019  . Falls frequently    broke foot  . Fracture 2021   left foot  . GERD (gastroesophageal reflux disease)   . Hemorrhoid 02/10/2015  . History of hiatal hernia   . History of kidney stones   . HOH (hard of hearing)   . Hypertension   . Lewy body dementia ([place] 04/04/2020  . Macular degeneration disease   . Osteoporosis   . PAF (paroxysmal atrial fibrillation) ([place]    on Eliquis for OAC - Rate Control   . Prostate cancer ([place]   . Prostate disorder   . Sciatic pain    Chronic  . Sleep apnea    cpap  . Temporary cerebral vascular dysfunction 02/10/2015   Had negative work-up.  July, 2012.  No medication changes, had forgot them that day, got dehydrated.   . Urinary incontinence     Past Surgical History:  Procedure Laterality Date  . ABDOMINAL AORTIC ENDOVASCULAR STENT GRAFT Bilateral 05/18/2019   Procedure: ABDOMINAL AORTIC ENDOVASCULAR STENT GRAFT;  Surgeon: DAlgernon Huxley MD;  Location: [facility] ORS;  Service: Vascular;  Laterality:  Bilateral;  . ANKLE SURGERY Left    ORIF   . Arch aortogram and carotid aortogram  06/02/2006   Dr Oneida Alar did surgery  . BASAL CELL CARCINOMA EXCISION  04/2016   Dermatology  . CARDIAC CATHETERIZATION  01/30/1998    (Dr. Linard Millers): Native LAD & mRCA CTO. 90% D1. Mod Cx. SVG-D1 CTO. SVG-RCA & SVG-OM along with LIMA-LAD patent;  LV NORMAL Fxn  . CAROTID ENDARTERECTOMY Left Oct. 29, 2007   Dr. Oneida Alar:  . CATARACT EXTRACTION W/PHACO Right 03/05/2017   Procedure: CATARACT EXTRACTION PHACO AND INTRAOCULAR LENS PLACEMENT (IOC);  Surgeon: Leandrew Koyanagi, MD;  Location: [facility] ORS;  Service: Ophthalmology;  Laterality: Right;  [place] 00:58.5 AP% 10.6 CDE 6.20 Fluid Pack lot # [id number]  . CATARACT EXTRACTION W/PHACO Left 04/15/2017   Procedure:  CATARACT EXTRACTION PHACO AND INTRAOCULAR LENS PLACEMENT ([place])  Left;  Surgeon: Leandrew Koyanagi, MD;  Location: [place];  Service: Ophthalmology;  Laterality: Left;  . COLONOSCOPY WITH PROPOFOL N/A 09/15/2017   Procedure: COLONOSCOPY WITH PROPOFOL;  Surgeon: Lucilla Lame, MD;  Location: [facility] ENDOSCOPY;  Service: Endoscopy;  Laterality: N/A;  . COLONOSCOPY WITH PROPOFOL N/A 10/26/2018   Procedure: COLONOSCOPY WITH PROPOFOL;  Surgeon: Lucilla Lame, MD;  Location[facility] ENDOSCOPY;  Service: Endoscopy;  Laterality: N/A;  . CORONARY ARTERY BYPASS GRAFT  1998   LIMA-LAD, SVG-OM, SVG-RPDA, SVG-DIAG  . CPET / MET  12/2012   Mild chronotropic incompetence - read 82% of predicted; also reduced effort; peak VO2 15.7 / 75% (did not reach Max effort) -- suggested ischemic response in last 1.5 minutes of exercise. Normal pulmonary function on PFTs but poor response to  . CYSTOSCOPY W/ RETROGRADES Left 01/13/2020   Procedure: CYSTOSCOPY WITH RETROGRADE PYELOGRAM;  Surgeon: Billey Co, MD;  Location: [facility] ORS;  Service: Urology;  Laterality: Left;  . CYSTOSCOPY WITH STENT PLACEMENT Left 12/26/2019   Procedure: CYSTOSCOPY WITH STENT PLACEMENT;  Surgeon: Billey Co, MD;  Location: [facility] ORS;  Service: Urology;  Laterality: Left;  . CYSTOSCOPY/URETEROSCOPY/HOLMIUM LASER/STENT PLACEMENT Left 01/13/2020   Procedure: CYSTOSCOPY/URETEROSCOPY/HOLMIUM LASER/STENT EXCHANGE;  Surgeon: Billey Co, MD;  Location: [facility] ORS;  Service: Urology;  Laterality: Left;  . ENDOVASCULAR REPAIR/STENT GRAFT N/A 05/18/2019   Procedure: ENDOVASCULAR REPAIR/STENT GRAFT;  Surgeon: Algernon Huxley, MD;  Location: [facility] INVASIVE CV LAB;; EVAR - 23 mm prox, 12 cm distal & x 18 cm length Gore Excluder Endoprosthesis Foster body (via RFA distal to lowest Renal A); 35m x 14 cm L Contralateral Limb for L Iliac - extended with 8 mm x 6cm LifeStar stent; no additional R Iliac PTA needed.   . ESOPHAGOGASTRODUODENOSCOPY (EGD) WITH  PROPOFOL N/A 09/15/2017   Procedure: ESOPHAGOGASTRODUODENOSCOPY (EGD) WITH PROPOFOL;  Surgeon: WLucilla Lame MD;  [facility];  Service: Endoscopy;  Laterality: N/A;  . FRACTURE SURGERY Right 03/19/2015   wrist  . FRACTURE SURGERY Left   . HIATAL HERNIA REPAIR    . ILIAC ARTERY STENT  12/15/2005   PTA and direct stenting rgt and lft common iliac arteries  . KYPHOPLASTY N/A 04/05/2019   Procedure: KYPHOPLASTY T12, L3, L4 L5;  Surgeon: MHessie Knows MD;  Location: [facility] ORS;  Service: Orthopedics;  Laterality: N/A;  . NM GATED MYOVIEW ([place])  05/'19; 8/'20   a) [facility]: Low Risk - no ischemia or infarct.  EF > 65%. no RWMA;; b) ([facility]: Normal wall motion.  No ischemia or infarction.  . OPEN REDUCTION INTERNAL FIXATION (ORIF) DISTAL RADIAL FRACTURE Left 01/13/2019  Procedure: OPEN REDUCTION INTERNAL FIXATION (ORIF) DISTAL  RADIAL FRACTURE - LEFT - SLEEP APNEA;  Surgeon: Hessie Knows, MD;  Location: [facility] ORS;  Service: Orthopedics;  Laterality: Left;  . ORIF ELBOW FRACTURE Right 01/01/2017   Procedure: OPEN REDUCTION INTERNAL FIXATION (ORIF) ELBOW/OLECRANON FRACTURE;  Surgeon: Hessie Knows, MD;  Location: [facility] ORS;  Service: Orthopedics;  Laterality: Right;  . ORIF WRIST FRACTURE Right 03/19/2015   Procedure: OPEN REDUCTION INTERNAL FIXATION (ORIF) WRIST FRACTURE;  Surgeon: Hessie Knows, MD;  Location: [facility] ORS;  Service: Orthopedics;  Laterality: Right;  . SHOULDER ARTHROSCOPY Right   . THORACIC AORTA - CAROTID ANGIOGRAM  October 2007   Dr. 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 TRANSTHORACIC ECHOCARDIOGRAM  February 2016   [facility]: Normal LV function. Dilated left atrium.  Domingo Dimes ECHOCARDIOGRAM  04/2019   [facility] April 20, 2019): EF 55-60 %.  Mild LVH.  Relatively normal valves.    There were no vitals filed for this visit.   Subjective Assessment - 06/26/20 1324     Subjective Patient presents with significant other, she reports she is interested in having patient participate in therapy after LSVT    Pertinent History Patient is a pleasant 86 year old male who presents to PT for his LSVT eval with his significant other. Patient has been seen by this therapist in the past for balance, back pain, and strength. History of hallucinations, kidney stones, kyphoplasty T12, L3, L4, L5 performed in August 2020 by Dr. Rudene Christians. MRI from [facility] dated 03/28/2019 report was reviewed today. Acute compression fractures are noted at T12, L3, and L4.PMH includes AAA, arthritis, atherosclerotic heart disease (s/p CABG), benign neoplasm of ascending/descending colon, COPD, CAD, dementia, dysrhythmia, falls, GERD, hemorrhoid, HOH, HTN, PAF, prostate cancer, osteoporosis, kidney stones, macular degeneration, Lewy Body Dementia.    Limitations Walking;Standing;House hold activities;Other (comment);Lifting;Sitting    How long can you sit comfortably? n/a    How long can you stand comfortably? needs a walker now, unsteady once standing    How long can you walk comfortably? uses a walker now; can walk in house.    Diagnostic tests [facility] dated 03/28/2019 report was reviewed today. Acute compression fractures are noted at T12, L3, and L4. He has facet arthropathy at the lower lumbar levels. At L3-4 he has moderate central stenosis.    Patient Stated Goals be more independent with ADLs and mobility    Currently in Pain? No/denies              Treatment: Patient seen for LSVT Daily Session Maximal Daily Exercises for facilitation/coordination of movement Sustained movements are designed to rescale the amplitude of movement output for generalization to daily functional activities .Performed as follows for 1 set of 10 repetitions each multidirectional sustained movements  1) Floor to ceiling , cues to hold for 10 seconds, needs modeling  for correct positions, VC to reach out furtherand to reach up to the ceiling; 8x 2) Side to side multidirectional Repetitive movements performed in sittingand are designed to provide retraining effort needed for sustained muscle activation in tasks, cues to lean fwd and hold position x 10 counts: Patient does not movesideways welland is not able to transition or stretch out leg very far; 8x each side; more challenging to R side 3) Step and reach forward , cues for good knee flex, cues to move UE and LEwith tactilecues required  to bring arm into abduction andto rotate arms up to pronate his forearms 4) Step and reach backwards , cues for BUE back, getting toe up and bending back knee 5) Step and reach sideways, cues to turn head sideways, and turn head back to neutral, and to rotate arms and pronate forearms 6) Rock and reach forward/backward , cues to reach far fwd with UE's, patient not comfortable with feet apart, not rocking very far to get a good weight shift, not able to raise heel or raise toes much 7) Rock and reach sideways, cues for twist and look behind, only rotates side ways, unable to twistfeet at this time.  functional component task with supervision 5 reps and simulated activities for: 1. Sit to standx5needs cues to begin with UE up for correct start position 2.enter/exitroom 5x.  3.putting on/off coat 5x, requires assistance only once 4. Play cards go fish for coordination, pattern recognition, memory, reaching, fine motor skills 5.Large step with rollatorand upright posturex5 each LE 6. Put 5 plates into/out of cabinet, open up cabinet and lock rollator, pick up and sort bowls.  Patient performed with instruction, verbal cues, tactile cues of therapist: goal:increase tissue extensibility, promote proper posture, improve mobility  Additional exercises: -put pants on/off on top of clothes 5x -unlock/lock rollator 5x -take shoes on/off 5x -carry paper  plates to/from countertop 5x  Ambulate>109f with cues for big step and foot clearance, with improved foot clearance with contact cue of BIG. With rollator and close CGA;walking into/out of rooms and into/out of hallways, negotiating obstacles and people. Two seated rest breaks   Homework: take pants on/off 5x  Pt presents with slowness of movements and fatigues with therapeutic exercises. Patient needs verbal, tactile and modeling cues with LSVT BIG exercises. Patient demonstrates difficulty with remembering to hold the seated exercises and the start and finish positions. Patient tolerated all interventions well this date and will benefit from continued skilled PT interventions to improve strength and balance and decrease risk of falling                             PT Education - 06/26/20 1325    Education provided Yes    Education Details LSVT, exercise technique    Person(s) Educated Patient    Methods Explanation;Demonstration;Tactile cues;Verbal cues    Comprehension Verbalized understanding;Returned demonstration;Verbal cues required;Tactile cues required            PT Short Term Goals - 06/20/20 1122      PT SHORT TERM GOAL #1   Title Patient will be independent in home exercise program to improve strength/mobility for better functional independence with ADLs.    Baseline 10/4: will start program tomorrow 10/20: performing but requires assistance    Time 2    Period Weeks    Status Partially Met    Target Date 07/02/20             PT Long Term Goals - 06/20/20 1123      PT LONG TERM GOAL #1   Title Patient will increase FOTO score to equal to or greater than 52% to demonstrate statistically significant improvement in mobility and quality of life.    Baseline 10/4: 47.3% 10/20: 63.7%    Time 4    Period Weeks    Status Achieved      PT LONG TERM GOAL #2   Title Patient will demonstrate an improved Berg Balance Score of > 41/56  as  to demonstrate improved balance with ADLs such as sitting/standing and transfer balance and reduced fall risk.    Baseline 10/4: 35/56 10/20: 42/56    Time 4    Period Weeks    Status Achieved      PT LONG TERM GOAL #3   Title Client will be able to get up from soft surfaces, including couch and reclinerin his home confidently on his 1st trial 100% of the time to allow him to safely access all areas of his home.    Baseline 10/4: needs help to get up 10/20: able to perform 50-60% of the time    Time 4    Period Weeks    Status Partially Met    Target Date 07/02/20      PT LONG TERM GOAL #4   Title Patient will improve his functional mobility be decreasing his cognitive TUG to < 15 seconds for improved mobility, stability, and safety with dual task to decrease fall risk.    Baseline 10/4: unable to perform cognitive aspect of test ( 29.19) 10/20: unable to perform cognitive portion 17.4 seconds wiht standard portion    Time 4    Period Weeks    Status Partially Met    Target Date 07/02/20                 Plan - 06/26/20 1328    Clinical Impression Statement Patient tolerates progression of interventions to include donning/doffing pants, moving objects around a kitchen without dropping them, and controlling AD. Patient is initially challenged with donning/doffing pants but improves with repetition. Patient continues to require constant visual cueing/demonstration for task performance each daily exercise indicating limited carryover between sessions and guarded/poor prognosis. Patient will benefit from continued skilled physical therapy in the LSVT program to continue to progress toward improved balance and reduced need for cues to sustain normalized amplitude with mobility and functional tasks    Personal Factors and Comorbidities Age;Comorbidity 3+;Fitness;Past/Current Experience;Sex;Social Background;Time since onset of injury/illness/exacerbation;Transportation    Comorbidities  AAA, arthritis, atherosclerotic heart disease, (s/p CABG), benign neoplasm of ascending/descending colon, COPD, CAD, dementia, dysrhythmia, frequent falls, GERD, hemorrhoid, HOH, HTN, PAF, prostate cancer.    Examination-Activity Limitations Bathing;Bend;Carry;Dressing;Continence;Stairs;Squat;Reach Overhead;Locomotion Level;Lift;Stand;Transfers;Toileting;Bed Mobility    Examination-Participation Restrictions Church;Cleaning;Community Activity;Driving;Interpersonal Relationship;Laundry;Volunteer;Shop;Personal Finances;Meal Prep;Yard Work    [employer] Evolving/Moderate complexity    Rehab Potential Fair    PT Frequency 4x / week    PT Duration 4 weeks    PT Treatment/Interventions ADLs/Self Care Home Management;Aquatic Therapy;Cryotherapy;Moist Heat;Traction;Ultrasound;Therapeutic activities;Functional mobility training;Stair training;Gait training;DME Instruction;Therapeutic exercise;Balance training;Neuromuscular re-education;Patient/family education;Manual techniques;Passive range of motion;Energy conservation;Vestibular;Taping;Biofeedback;Electrical Stimulation;Iontophoresis 58m/ml Dexamethasone;Dry needling;Visual/perceptual remediation/compensation;Cognitive remediation    PT Next Visit Plan LSVT    Consulted and Agree with Plan of Care Patient;Family member/caregiver    Family Member Consulted significant other           Patient will benefit from skilled therapeutic intervention in order to improve the following deficits and impairments:  Abnormal gait, Cardiopulmonary status limiting activity, Decreased activity tolerance, Decreased balance, Decreased knowledge of precautions, Decreased endurance, Decreased coordination, Decreased cognition, Decreased knowledge of use of DME, Decreased mobility, Difficulty walking, Decreased safety awareness, Decreased strength, Impaired flexibility, Impaired perceived functional ability, Impaired sensation, Postural dysfunction,  Improper body mechanics, Pain, Impaired vision/preception, Decreased range of motion  Visit Diagnosis: Unsteadiness on feet  Other abnormalities of gait and mobility  Muscle weakness (generalized)     Problem List Patient Active Problem List   Diagnosis Date Noted  .  LBD (Lewy body dementia) ([place]) 05/14/2020  . Hematuria   . Acute kidney injury superimposed on CKD ([place])   . Sepsis due to gram-negative UTI ([place]) 12/26/2019  . Left nephrolithiasis 12/26/2019  . Hydronephrosis, left 12/26/2019  . Status post abdominal aortic aneurysm (AAA) repair 04/15/2019  . Malignant neoplasm of descending colon ([place])   . Do not intubate, cardiopulmonary resuscitation (CPR)-only code status 10/08/2018  . Prostate cancer ([place]) 02/25/2018  . Prostatic cyst 01/15/2018  . 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 ([place]) 09/15/2017  . Blood in stool   . Abnormal feces   . Stricture and stenosis of esophagus   . Mild cognitive impairment 08/18/2017  . Senile purpura ([place]) 04/29/2017  . Anemia 04/29/2017  . Frequent falls 04/29/2017  . Simple chronic bronchitis ([place]) 12/17/2016  . Benign localized hyperplasia of prostate with urinary obstruction 07/15/2016  . Elevated PSA 07/15/2016  . Incomplete emptying of bladder 07/15/2016  . Urge incontinence 07/15/2016  . Hypothyroidism 04/25/2016  . Macular degeneration 04/25/2016  . Erectile dysfunction due to arterial insufficiency   . Ischemic heart disease with chronotropic incompetence   . CN (constipation) 02/10/2015  . DD (diverticular disease) 02/10/2015  . Weakness 02/10/2015  . Lichen planus 67/67/2094  . Restless leg 02/10/2015  . 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 B12 deficiency 02/10/2015  . COPD (chronic obstructive pulmonary disease) ([place]) 11/14/2014  . Post Inflammatory Lung Changes 11/14/2014  . Acute delirium 11/08/2014  . PAF (paroxysmal atrial fibrillation) (HCC) CHA2DS2-VASc = 4. AC = Eliquis   . Insomnia 12/09/2013  . OSA on CPAP    . Dyspnea on exertion - -essentially resolved with BB dose reduction & wgt loss 03/29/2013  . Overweight (BMI 25.0-29.9) -- Wgt back up 01/17/2013  . Atherosclerotic heart disease of native coronary artery without angina pectoris - s/p CABG, occluded SVG-D1; patent LIMA-LAD, SVG-OM, SVG- RCA   . Essential hypertension   . Hyperlipidemia with target LDL less than 70   . Aorto-iliac disease ([place])   . BCC (basal cell carcinoma), eyelid 01/03/2013  . Allergic rhinitis 08/17/2009  . Adaptation reaction 05/28/2009  . Acid reflux 05/28/2009  . Arthritis, degenerative 05/28/2009  . 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 Carotid artery occlusion without infarction 06/01/2006  . PAD (peripheral artery disease) - bilateral common iliac stents 12/15/2005  . Atherosclerotic heart disease of artery bypass graft 09/01/1997   Janna Arch, PT, DPT   06/26/2020, 1:30 PM  [facility] SERVICES [address] [place], [place], [address] Phone: [phone]   Fax:  [phone]  Name: SHADRACH BARTUNEK MRN: [id number] Date of Birth: August 07, 1934

## 2020-06-27 ENCOUNTER — Ambulatory Visit: Payer: Medicare Other

## 2020-06-27 ENCOUNTER — Other Ambulatory Visit: Payer: Self-pay

## 2020-06-27 ENCOUNTER — Encounter: Payer: 59 | Admitting: Speech Pathology

## 2020-06-27 DIAGNOSIS — R2681 Unsteadiness on feet: Secondary | ICD-10-CM

## 2020-06-27 DIAGNOSIS — M6281 Muscle weakness (generalized): Secondary | ICD-10-CM

## 2020-06-27 DIAGNOSIS — R2689 Other abnormalities of gait and mobility: Secondary | ICD-10-CM

## 2020-06-27 NOTE — Therapy (Signed)
[place] [facility] SERVICES [address] [place], [place], [address] Phone: [phone]   Fax:  [phone]  Physical Therapy Treatment  Patient Details  Name: [name] MRN: [id number] Date of Birth: 1934/02/18 Referring Provider (PT): Meeler, Sherren Kerns FNP   Encounter Date: 06/27/2020   PT End of Session - 06/27/20 1130    Visit Number 15    Number of Visits 17    Date for PT Re-Evaluation 07/02/20    Authorization Type 5/10 PN 10/20    PT Start Time 1000    PT Stop Time 1058    PT Time Calculation (min) 58 min    Equipment Utilized During Treatment Gait belt    Activity Tolerance Patient tolerated treatment well;Patient limited by fatigue;Other (comment)    Behavior During Therapy WFL for tasks assessed/performed           Past Medical History:  Diagnosis Date   AAA (abdominal aortic aneurysm) ([place]) 05/2019   [place] Rapid progression from June-Aug 2020 (4.6 - 5.4 cm): s/p EVAR (Dr. Lucky [facility]boy [facility]): EVAR - 23 mm prox, 12 cm distal & x 18 cm length Gore Excluder Endoprosthesis Main body (via RFA distal to lowest Renal A); 5m x 14 cm L Contralateral Limb for L Iliac - extended with 8 mm x 6cm LifeStar stent (post-dilated with 7 mm DEB).  Additional R Iliac PTA not required.    Arthritis    Basal cell carcinoma of eyelid 01/03/2013   2019 - R side of Nose   Benign neoplasm of ascending colon    Benign neoplasm of descending colon    Bilateral iliac artery stenosis ([place] 2007   Bilateral Iliac A stenting; extension of EVAR limbs into both Iliacs -- additional stent placed in L Iliac.   Cancer ([facility]    Skin cancer   Colon polyps    Complication of anesthesia    severe confusion agitation requiring hospital admission x 4 days   Compression fracture of fourth lumbar vertebra ([place] 04/2019   - s/p Kyphoplasty; T12, L3-5 (*[facility])   COPD (chronic obstructive pulmonary disease) (HCC)    Centrilobular Emphysema    Coronary artery disease involving native coronary artery without angina pectoris 1998   - s/p CABG, occluded SVG-D1; patent LIMA-LAD, SVG-OM, SVG- RCA   Dementia ([place]    memory loss - started noting late 2019   Falls frequently    broke foot   Fracture 2021   left foot   GERD (gastroesophageal reflux disease)    Hemorrhoid 02/10/2015   History of hiatal hernia    History of kidney stones    HOH (hard of hearing)    Hypertension    Lewy body dementia ([place] 04/04/2020   Macular degeneration disease    Osteoporosis    PAF (paroxysmal atrial fibrillation) (HCC)    on Eliquis for OAC - Rate Control    Prostate cancer (HCC)    Prostate disorder    Sciatic pain    Chronic   Sleep apnea    cpap   Temporary cerebral vascular dysfunction 02/10/2015   Had negative work-up.  July, 2012.  No medication changes, had forgot them that day, got dehydrated.    Urinary incontinence     Past Surgical History:  Procedure Laterality Date   ABDOMINAL AORTIC ENDOVASCULAR STENT GRAFT Bilateral 05/18/2019   Procedure: ABDOMINAL AORTIC ENDOVASCULAR STENT GRAFT;  Surgeon: DAlgernon Huxley MD;  Location: [facility] ORS;  Service: Vascular;  Laterality:  Bilateral;   ANKLE SURGERY Left    ORIF    Arch aortogram and carotid aortogram  06/02/2006   Dr Oneida Alar did surgery   BASAL CELL CARCINOMA EXCISION  04/2016   Dermatology   CARDIAC CATHETERIZATION  01/30/1998    (Dr. Linard Millers): Native LAD & mRCA CTO. 90% D1. Mod Cx. SVG-D1 CTO. SVG-RCA & SVG-OM along with LIMA-LAD patent;  LV NORMAL Fxn   CAROTID ENDARTERECTOMY Left Oct. 29, 2007   Dr. Oneida Alar:   CATARACT EXTRACTION W/PHACO Right 03/05/2017   Procedure: CATARACT EXTRACTION PHACO AND INTRAOCULAR LENS PLACEMENT ([place]);  Surgeon: Leandrew Koyanagi, MD;  Location: [facility] ORS;  Service: Ophthalmology;  Laterality: Right;  [place] 00:58.5 AP% 10.6 CDE 6.20 Fluid Pack lot # [id number]   CATARACT EXTRACTION W/PHACO Left 04/15/2017   Procedure:  CATARACT EXTRACTION PHACO AND INTRAOCULAR LENS PLACEMENT ([place])  Left;  Surgeon: Leandrew Koyanagi, MD;  Location: [place];  Service: Ophthalmology;  Laterality: Left;   COLONOSCOPY WITH PROPOFOL N/A 09/15/2017   Procedure: COLONOSCOPY WITH PROPOFOL;  Surgeon: Lucilla Lame, MD;  Location: [facility] ENDOSCOPY;  Service: Endoscopy;  Laterality: N/A;   COLONOSCOPY WITH PROPOFOL N/A 10/26/2018   Procedure: COLONOSCOPY WITH PROPOFOL;  Surgeon: Lucilla Lame, MD;  Location: [facility]COPY;  Service: Endoscopy;  Laterality: N/A;   CORONARY ARTERY BYPASS GRAFT  1998   LIMA-LAD, SVG-OM, SVG-RPDA, SVG-DIAG   CPET / MET  12/2012   Mild chronotropic incompetence - read 82% of predicted; also reduced effort; peak VO2 15.7 / 75% (did not reach Max effort) -- suggested ischemic response in last 1.5 minutes of exercise. Normal pulmonary function on PFTs but poor response to   CYSTOSCOPY W/ RETROGRADES Left 01/13/2020   Procedure: CYSTOSCOPY WITH RETROGRADE PYELOGRAM;  Surgeon: Billey Co, MD;  Location: [facility] ORS;  Service: Urology;  Laterality: Left;   CYSTOSCOPY WITH STENT PLACEMENT Left 12/26/2019   Procedure: CYSTOSCOPY WITH STENT PLACEMENT;  Surgeon: Billey Co, MD;  Location: [facility] ORS;  Service: Urology;  Laterality: Left;   CYSTOSCOPY/URETEROSCOPY/HOLMIUM LASER/STENT PLACEMENT Left 01/13/2020   Procedure: CYSTOSCOPY/URETEROSCOPY/HOLMIUM LASER/STENT EXCHANGE;  Surgeon: Billey Co, MD;  Location: [facility] ORS;  Service: Urology;  Laterality: Left;   ENDOVASCULAR REPAIR/STENT GRAFT N/A 05/18/2019   Procedure: ENDOVASCULAR REPAIR/STENT GRAFT;  Surgeon: Algernon Huxley, MD;  Location: [facility] INVASIVE CV LAB;; EVAR - 23 mm prox, 12 cm distal & x 18 cm length Gore Excluder Endoprosthesis Main body (via RFA distal to lowest Renal A); 10m x 14 cm L Contralateral Limb for L Iliac - extended with 8 mm x 6cm LifeStar stent; no additional R Iliac PTA needed.    ESOPHAGOGASTRODUODENOSCOPY (EGD) WITH  PROPOFOL N/A 09/15/2017   Procedure: ESOPHAGOGASTRODUODENOSCOPY (EGD) WITH PROPOFOL;  Surgeon: WLucilla Lame MD;  [facility];  Service: Endoscopy;  Laterality: N/A;   FRACTURE SURGERY Right 03/19/2015   wrist   FRACTURE SURGERY Left    HIATAL HERNIA REPAIR     ILIAC ARTERY STENT  12/15/2005   PTA and direct stenting rgt and lft common iliac arteries   KYPHOPLASTY N/A 04/05/2019   Procedure: KYPHOPLASTY T12, L3, L4 L5;  Surgeon: MHessie Knows MD;  Location: [facility] ORS;  Service: Orthopedics;  Laterality: N/A;   NM GATED MYOVIEW ([place])  05/'19; 8/'20   a) [facility]: Low Risk - no ischemia or infarct.  EF > 65%. no RWMA;; b) ([facility]: Normal wall motion.  No ischemia or infarction.   OPEN REDUCTION INTERNAL FIXATION (ORIF) DISTAL RADIAL FRACTURE Left 01/13/2019  Procedure: OPEN REDUCTION INTERNAL FIXATION (ORIF) DISTAL  RADIAL FRACTURE - LEFT - SLEEP APNEA;  Surgeon: Hessie Knows, MD;  Location: [facility] ORS;  Service: Orthopedics;  Laterality: Left;   ORIF ELBOW FRACTURE Right 01/01/2017   Procedure: OPEN REDUCTION INTERNAL FIXATION (ORIF) ELBOW/OLECRANON FRACTURE;  Surgeon: Hessie Knows, MD;  Location: [facility] ORS;  Service: Orthopedics;  Laterality: Right;   ORIF WRIST FRACTURE Right 03/19/2015   Procedure: OPEN REDUCTION INTERNAL FIXATION (ORIF) WRIST FRACTURE;  Surgeon: Hessie Knows, MD;  Location: [facility] ORS;  Service: Orthopedics;  Laterality: Right;   SHOULDER ARTHROSCOPY Right    THORACIC AORTA - CAROTID ANGIOGRAM  October 2007   Dr. 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 Dilated left atrium.   TRANSTHORACIC ECHOCARDIOGRAM  04/2019   [facility] April 20, 2019): EF 55-60 %.  Mild LVH.  Relatively normal valves.    There were no vitals filed for this visit.   Subjective Assessment - 06/27/20 1126     Subjective Patient presents with son.  Aware that tomorrow is last day of LSVT    Pertinent History Patient is a pleasant 86 year old male who presents to PT for his LSVT eval with his significant other. Patient has been seen by this therapist in the past for balance, back pain, and strength. History of hallucinations, kidney stones, kyphoplasty T12, L3, L4, L5 performed in August 2020 by Dr. Rudene Christians. MRI from [facility] dated 03/28/2019 report was reviewed today. Acute compression fractures are noted at T12, L3, and L4.PMH includes AAA, arthritis, atherosclerotic heart disease (s/p CABG), benign neoplasm of ascending/descending colon, COPD, CAD, dementia, dysrhythmia, falls, GERD, hemorrhoid, HOH, HTN, PAF, prostate cancer, osteoporosis, kidney stones, macular degeneration, Lewy Body Dementia.    Limitations Walking;Standing;House hold activities;Other (comment);Lifting;Sitting    How long can you sit comfortably? n/a    How long can you stand comfortably? needs a walker now, unsteady once standing    How long can you walk comfortably? uses a walker now; can walk in house.    Diagnostic tests [facility] dated 03/28/2019 report was reviewed today. Acute compression fractures are noted at T12, L3, and L4. He has facet arthropathy at the lower lumbar levels. At L3-4 he has moderate central stenosis.    Patient Stated Goals be more independent with ADLs and mobility    Currently in Pain? No/denies               Treatment: Patient seen for LSVT Daily Session Maximal Daily Exercises for facilitation/coordination of movement Sustained movements are designed to rescale the amplitude of movement output for generalization to daily functional activities .Performed as follows for 1 set of 10 repetitions each multidirectional sustained movements  1) Floor to ceiling , cues to hold for 10 seconds, needs modeling for correct positions, VC to reach out furtherand  to reach up to the ceiling; 8x 2) Side to side multidirectional Repetitive movements performed in sittingand are designed to provide retraining effort needed for sustained muscle activation in tasks, cues to lean fwd and hold position x 10 counts: Patient does not movesideways welland is not able to transition or stretch out leg very far; 8x each side; more challenging to R side 3) Step and reach forward , cues for good knee flex, cues to move UE and LEwith tactilecues required to bring arm into  abduction andto rotate arms up to pronate his forearms 4) Step and reach backwards , cues for BUE back, getting toe up and bending back knee 5) Step and reach sideways, cues to turn head sideways, and turn head back to neutral, and to rotate arms and pronate forearms 6) Rock and reach forward/backward , cues to reach far fwd with UE's, patient not comfortable with feet apart, not rocking very far to get a good weight shift, not able to raise heel or raise toes much 7) Rock and reach sideways, cues for twist and look behind, only rotates side ways, unable to twistfeet at this time.  functional component task with supervision 5 reps and simulated activities for: 1. Sit to standx5needs cues to begin with UE up for correct start position 2.enter/exitroom 5x.  3.putting on/off coat 5x, requires assistance only once 4. Button jackets buttons 5x  5.Large step with rollatorand upright posturex5 each LE 6. Put 5 plates into/out of cabinet, open up cabinet and lock rollator, pick up and sort bowls. Patient performed with instruction, verbal cues, tactile cues of therapist: goal:increase tissue extensibility, promote proper posture, improve mobility  Additional exercises: -put pants on/off on top of clothes 5x -unlock/lock rollator 5x -toilet mobility with mod I/supervision   Ambulate>697f with cues for big step and foot clearance, with improved foot clearance with contact cue  of BIG. With rollator and close CGA;walking into/out of rooms and into/out of hallways, negotiating obstacles and people.Two seated rest breaks  Homework: button/unbutton   Pt presents with slowness of movements and fatigues with therapeutic exercises. Patient needs verbal, tactile and modeling cues with LSVT BIG exercises. Patient demonstrates difficulty with remembering to hold the seated exercises and the start and finish positions. Patient tolerated all interventions well this date and will benefit from continued skilled PT interventions to improve strength and balance and decrease risk of falling                          PT Education - 06/27/20 1129    Education provided Yes    Education Details LSVT, exercise technique, buttons    Person(s) Educated Patient    Methods Explanation;Demonstration;Tactile cues;Verbal cues    Comprehension Verbalized understanding;Returned demonstration;Verbal cues required;Tactile cues required            PT Short Term Goals - 06/20/20 1122      PT SHORT TERM GOAL #1   Title Patient will be independent in home exercise program to improve strength/mobility for better functional independence with ADLs.    Baseline 10/4: will start program tomorrow 10/20: performing but requires assistance    Time 2    Period Weeks    Status Partially Met    Target Date 07/02/20             PT Long Term Goals - 06/20/20 1123      PT LONG TERM GOAL #1   Title Patient will increase FOTO score to equal to or greater than 52% to demonstrate statistically significant improvement in mobility and quality of life.    Baseline 10/4: 47.3% 10/20: 63.7%    Time 4    Period Weeks    Status Achieved      PT LONG TERM GOAL #2   Title Patient will demonstrate an improved Berg Balance Score of > 41/56 as to demonstrate improved balance with ADLs such as sitting/standing and transfer balance and reduced fall risk.    Baseline 10/4: 35/56  10/20: 42/56    Time 4    Period Weeks    Status Achieved      PT LONG TERM GOAL #3   Title Client will be able to get up from soft surfaces, including couch and reclinerin his home confidently on his 1st trial 100% of the time to allow him to safely access all areas of his home.    Baseline 10/4: needs help to get up 10/20: able to perform 50-60% of the time    Time 4    Period Weeks    Status Partially Met    Target Date 07/02/20      PT LONG TERM GOAL #4   Title Patient will improve his functional mobility be decreasing his cognitive TUG to < 15 seconds for improved mobility, stability, and safety with dual task to decrease fall risk.    Baseline 10/4: unable to perform cognitive aspect of test ( 29.19) 10/20: unable to perform cognitive portion 17.4 seconds wiht standard portion    Time 4    Period Weeks    Status Partially Met    Target Date 07/02/20                 Plan - 06/27/20 1133    Clinical Impression Statement Patient is slightly more confused this session requiring additional rest breaks due to fatigue as well. Patient introduced to buttons today and after initial flicks of hands was able to button without difficulty. Patient continues to require constant visual cueing/demonstration for task performance each daily exercise indicating limited carryover between sessions and guarded/poor prognosis. Patient will benefit from continued skilled physical therapy in the LSVT program to continue to progress toward improved balance and reduced need for cues to sustain normalized amplitude with mobility and functional tasks    Personal Factors and Comorbidities Age;Comorbidity 3+;Fitness;Past/Current Experience;Sex;Social Background;Time since onset of injury/illness/exacerbation;Transportation    Comorbidities AAA, arthritis, atherosclerotic heart disease, (s/p CABG), benign neoplasm of ascending/descending colon, COPD, CAD, dementia, dysrhythmia, frequent falls, GERD,  hemorrhoid, HOH, HTN, PAF, prostate cancer.    Examination-Activity Limitations Bathing;Bend;Carry;Dressing;Continence;Stairs;Squat;Reach Overhead;Locomotion Level;Lift;Stand;Transfers;Toileting;Bed Mobility    Examination-Participation Restrictions Church;Cleaning;Community Activity;Driving;Interpersonal Relationship;Laundry;Volunteer;Shop;Personal Finances;Meal Prep;Yard Work    [employer] Evolving/Moderate complexity    Rehab Potential Fair    PT Frequency 4x / week    PT Duration 4 weeks    PT Treatment/Interventions ADLs/Self Care Home Management;Aquatic Therapy;Cryotherapy;Moist Heat;Traction;Ultrasound;Therapeutic activities;Functional mobility training;Stair training;Gait training;DME Instruction;Therapeutic exercise;Balance training;Neuromuscular re-education;Patient/family education;Manual techniques;Passive range of motion;Energy conservation;Vestibular;Taping;Biofeedback;Electrical Stimulation;Iontophoresis 16m/ml Dexamethasone;Dry needling;Visual/perceptual remediation/compensation;Cognitive remediation    PT Next Visit Plan LSVT    Consulted and Agree with Plan of Care Patient;Family member/caregiver    Family Member Consulted significant other           Patient will benefit from skilled therapeutic intervention in order to improve the following deficits and impairments:  Abnormal gait, Cardiopulmonary status limiting activity, Decreased activity tolerance, Decreased balance, Decreased knowledge of precautions, Decreased endurance, Decreased coordination, Decreased cognition, Decreased knowledge of use of DME, Decreased mobility, Difficulty walking, Decreased safety awareness, Decreased strength, Impaired flexibility, Impaired perceived functional ability, Impaired sensation, Postural dysfunction, Improper body mechanics, Pain, Impaired vision/preception, Decreased range of motion  Visit Diagnosis: Unsteadiness on feet  Other abnormalities of gait and  mobility  Muscle weakness (generalized)     Problem List Patient Active Problem List   Diagnosis Date Noted   LBD (Lewy body dementia) ([place] 05/14/2020   Hematuria    Acute kidney injury superimposed on CKD ([place]  Sepsis due to gram-negative UTI ([place]) 12/26/2019   Left nephrolithiasis 12/26/2019   Hydronephrosis, left 12/26/2019   Status post abdominal aortic aneurysm (AAA) repair 04/15/2019   Malignant neoplasm of descending colon ([place])    Do not intubate, cardiopulmonary resuscitation (CPR)-only code status 10/08/2018   Prostate cancer ([place]) 02/25/2018   Prostatic cyst 01/15/2018   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 ([place]) 09/15/2017   Blood in stool    Abnormal feces    Stricture and stenosis of esophagus    Mild cognitive impairment 08/18/2017   Senile purpura ([place]) 04/29/2017   Anemia 04/29/2017   Frequent falls 04/29/2017   Simple chronic bronchitis ([place]) 12/17/2016   Benign localized hyperplasia of prostate with urinary obstruction 07/15/2016   Elevated PSA 07/15/2016   Incomplete emptying of bladder 07/15/2016   Urge incontinence 07/15/2016   Hypothyroidism 04/25/2016   Macular degeneration 04/25/2016   Erectile dysfunction due to arterial insufficiency    Ischemic heart disease with chronotropic incompetence    CN (constipation) 02/10/2015   DD (diverticular disease) 02/10/2015   Weakness 46/19/0122   Lichen planus 24/07/4642   Restless leg 02/10/2015   Circadian rhythm disorder 02/10/2015   B12 deficiency 02/10/2015   COPD (chronic obstructive pulmonary disease) ([place]) 11/14/2014   Post Inflammatory Lung Changes 11/14/2014   Acute delirium 11/08/2014   PAF (paroxysmal atrial fibrillation) (HCC) CHA2DS2-VASc = 4. AC = Eliquis    Insomnia 12/09/2013   OSA on CPAP    Dyspnea on exertion - -essentially resolved with BB dose reduction & wgt loss 03/29/2013   Overweight (BMI 25.0-29.9) -- Wgt back up 01/17/2013    Atherosclerotic heart disease of native coronary artery without angina pectoris - s/p CABG, occluded SVG-D1; patent LIMA-LAD, SVG-OM, SVG- RCA    Essential hypertension    Hyperlipidemia with target LDL less than 70    Aorto-iliac disease ([place])    BCC (basal cell carcinoma), eyelid 01/03/2013   Allergic rhinitis 08/17/2009   Adaptation reaction 05/28/2009   Acid reflux 05/28/2009   Arthritis, degenerative 05/28/2009   Abnormality of aortic arch branch 06/01/2006   Carotid artery occlusion without infarction 06/01/2006   PAD (peripheral artery disease) - bilateral common iliac stents 12/15/2005   Atherosclerotic heart disease of artery bypass graft 09/01/1997   Janna Arch, PT, DPT   06/27/2020, 11:35 AM  [place] MAIN [facility] SERVICES [place], [place], [address] Phone: [phone]   Fax:  [phone]  Name: [name] MRN: [id number] Date of Birth: 06-14-1934

## 2020-06-28 ENCOUNTER — Ambulatory Visit: Payer: Medicare Other

## 2020-06-28 ENCOUNTER — Other Ambulatory Visit: Payer: Self-pay

## 2020-06-28 ENCOUNTER — Encounter: Payer: 59 | Admitting: Speech Pathology

## 2020-06-28 DIAGNOSIS — R2689 Other abnormalities of gait and mobility: Secondary | ICD-10-CM

## 2020-06-28 DIAGNOSIS — R2681 Unsteadiness on feet: Secondary | ICD-10-CM | POA: Diagnosis not present

## 2020-06-28 DIAGNOSIS — M6281 Muscle weakness (generalized): Secondary | ICD-10-CM

## 2020-06-28 NOTE — Therapy (Signed)
[place] MAIN [facility] SERVICES [address] [place], [place], [address] Phone: [phone]   Fax:  [phone]  Physical Therapy Treatment  Patient Details  Name: [name] MRN: [id number] Date of Birth: [birth year] Referring Provider (PT): Meeler, Sherren Kerns FNP   Encounter Date: 06/28/2020   PT End of Session - 06/28/20 1359    Visit Number 16    Number of Visits 17    Date for PT Re-Evaluation 07/02/20    Authorization Type 6/10 PN 10/20    PT Start Time 1100    PT Stop Time 1154    PT Time Calculation (min) 54 min    Equipment Utilized During Treatment Gait belt    Activity Tolerance Patient tolerated treatment well;Patient limited by fatigue;Other (comment)    Behavior During Therapy WFL for tasks assessed/performed           Past Medical History:  Diagnosis Date   AAA (abdominal aortic aneurysm) ([place]) 05/2019   [place] Rapid progression from June-Aug 2020 (4.6 - 5.4 cm): s/p EVAR (Dr. Lucky Cowboy [facility]): EVAR - 23 mm prox, 12 cm distal & x 18 cm length Gore Excluder Endoprosthesis Main body (via RFA distal to lowest Renal A); 79m x 14 cm L Contralateral Limb for L Iliac - extended with 8 mm x 6cm LifeStar stent (post-dilated with 7 mm DEB).  Additional R Iliac PTA not required.    Arthritis    Basal cell carcinoma of eyelid 01/03/2013   2019 - R side of Nose   Benign neoplasm of ascending colon    Benign neoplasm of descending colon    Bilateral iliac artery stenosis ([place]   Bilateral Iliac A stenting; extension of EVAR limbs into both Iliacs -- additional stent placed in L Iliac.   Cancer ([facility]    Skin cancer   Colon polyps    Complication of anesthesia    severe confusion agitation requiring hospital admission x 4 days   Compression fracture of fourth lumbar vertebra ([place] 04/2019   - s/p Kyphoplasty; T12, L3-5 (*[facility])   COPD (chronic obstructive pulmonary disease) (HCC)    Centrilobular Emphysema    Coronary artery disease involving native coronary artery without angina pectoris 1998   - s/p CABG, occluded SVG-D1; patent LIMA-LAD, SVG-OM, SVG- RCA   Dementia ([place]    memory loss - started noting late 2019   Falls frequently    broke foot   Fracture 2021   left foot   GERD (gastroesophageal reflux disease)    Hemorrhoid 02/10/2015   History of hiatal hernia    History of kidney stones    HOH (hard of hearing)    Hypertension    Lewy body dementia ([place] 04/04/2020   Macular degeneration disease    Osteoporosis    PAF (paroxysmal atrial fibrillation) (HCC)    on Eliquis for OAC - Rate Control    Prostate cancer (HCC)    Prostate disorder    Sciatic pain    Chronic   Sleep apnea    cpap   Temporary cerebral vascular dysfunction 02/10/2015   Had negative work-up.  July, 2012.  No medication changes, had forgot them that day, got dehydrated.    Urinary incontinence     Past Surgical History:  Procedure Laterality Date   ABDOMINAL AORTIC ENDOVASCULAR STENT GRAFT Bilateral 05/18/2019   Procedure: ABDOMINAL AORTIC ENDOVASCULAR STENT GRAFT;  Surgeon: DAlgernon Huxley MD;  Location: [facility] ORS;  Service: Vascular;  Laterality:  Bilateral;   ANKLE SURGERY Left    ORIF    Arch aortogram and carotid aortogram  06/02/2006   Dr Oneida Alar did surgery   BASAL CELL CARCINOMA EXCISION  04/2016   Dermatology   CARDIAC CATHETERIZATION  01/30/1998    (Dr. Linard Millers): Native LAD & mRCA CTO. 90% D1. Mod Cx. SVG-D1 CTO. SVG-RCA & SVG-OM along with LIMA-LAD patent;  LV NORMAL Fxn   CAROTID ENDARTERECTOMY Left Oct. 29, 2007   Dr. Oneida Alar:   CATARACT EXTRACTION W/PHACO Right 03/05/2017   Procedure: CATARACT EXTRACTION PHACO AND INTRAOCULAR LENS PLACEMENT (Short);  Surgeon: Leandrew Koyanagi, MD;  Location: [facility] ORS;  Service: Ophthalmology;  Laterality: Right;  [place] 00:58.5 AP% 10.6 CDE 6.20 Fluid Pack lot # [id number]   CATARACT EXTRACTION W/PHACO Left 04/15/2017   Procedure:  CATARACT EXTRACTION PHACO AND INTRAOCULAR LENS PLACEMENT ([place])  Left;  Surgeon: Leandrew Koyanagi, MD;  Location: [place];  Service: Ophthalmology;  Laterality: Left;   COLONOSCOPY WITH PROPOFOL N/A 09/15/2017   Procedure: COLONOSCOPY WITH PROPOFOL;  Surgeon: Lucilla Lame, MD;  Location: [facility] ENDOSCOPY;  Service: Endoscopy;  Laterality: N/A;   COLONOSCOPY WITH PROPOFOL N/A 10/26/2018   Procedure: COLONOSCOPY WITH PROPOFOL;  Surgeon: Lucilla Lame, MD;  Location: [facility] ENDOSCOPY;  Service: Endoscopy;  Laterality: N/A;   CORONARY ARTERY BYPASS GRAFT  1998   LIMA-LAD, SVG-OM, SVG-RPDA, SVG-DIAG   CPET / MET  12/2012   Mild chronotropic incompetence - read 82% of predicted; also reduced effort; peak VO2 15.7 / 75% (did not reach Max effort) -- suggested ischemic response in last 1.5 minutes of exercise. Normal pulmonary function on PFTs but poor response to   CYSTOSCOPY W/ RETROGRADES Left 01/13/2020   Procedure: CYSTOSCOPY WITH RETROGRADE PYELOGRAM;  Surgeon: Billey Co, MD;  Location: [facility] ORS;  Service: Urology;  Laterality: Left;   CYSTOSCOPY WITH STENT PLACEMENT Left 12/26/2019   Procedure: CYSTOSCOPY WITH STENT PLACEMENT;  Surgeon: Billey Co, MD;  Location: [facility] ORS;  Service: Urology;  Laterality: Left;   CYSTOSCOPY/URETEROSCOPY/HOLMIUM LASER/STENT PLACEMENT Left 01/13/2020   Procedure: CYSTOSCOPY/URETEROSCOPY/HOLMIUM LASER/STENT EXCHANGE;  Surgeon: Billey Co, MD;  Location: [facility] ORS;  Service: Urology;  Laterality: Left;   ENDOVASCULAR REPAIR/STENT GRAFT N/A 05/18/2019   Procedure: ENDOVASCULAR REPAIR/STENT GRAFT;  Surgeon: Algernon Huxley, MD;  Location: [facility] INVASIVE CV LAB;; EVAR - 23 mm prox, 12 cm distal & x 18 cm length Gore Excluder Endoprosthesis Main body (via RFA distal to lowest Renal A); 65m x 14 cm L Contralateral Limb for L Iliac - extended with 8 mm x 6cm LifeStar stent; no additional R Iliac PTA needed.    ESOPHAGOGASTRODUODENOSCOPY (EGD) WITH  PROPOFOL N/A 09/15/2017   Procedure: ESOPHAGOGASTRODUODENOSCOPY (EGD) WITH PROPOFOL;  Surgeon: WLucilla Lame MD;  [facility];  Service: Endoscopy;  Laterality: N/A;   FRACTURE SURGERY Right 03/19/2015   wrist   FRACTURE SURGERY Left    HIATAL HERNIA REPAIR     ILIAC ARTERY STENT  12/15/2005   PTA and direct stenting rgt and lft common iliac arteries   KYPHOPLASTY N/A 04/05/2019   Procedure: KYPHOPLASTY T12, L3, L4 L5;  Surgeon: MHessie Knows MD;  Location: [facility] ORS;  Service: Orthopedics;  Laterality: N/A;   NM GATED MYOVIEW ([place])  05/'19; 8/'20   a) [facility]: Low Risk - no ischemia or infarct.  EF > 65%. no RWMA;; b) ([facility]: Normal wall motion.  No ischemia or infarction.   OPEN REDUCTION INTERNAL FIXATION (ORIF) DISTAL RADIAL FRACTURE Left 01/13/2019  Procedure: OPEN REDUCTION INTERNAL FIXATION (ORIF) DISTAL  RADIAL FRACTURE - LEFT - SLEEP APNEA;  Surgeon: Hessie Knows, MD;  Location: [facility] ORS;  Service: Orthopedics;  Laterality: Left;   ORIF ELBOW FRACTURE Right 01/01/2017   Procedure: OPEN REDUCTION INTERNAL FIXATION (ORIF) ELBOW/OLECRANON FRACTURE;  Surgeon: Hessie Knows, MD;  Location: [facility] ORS;  Service: Orthopedics;  Laterality: Right;   ORIF WRIST FRACTURE Right 03/19/2015   Procedure: OPEN REDUCTION INTERNAL FIXATION (ORIF) WRIST FRACTURE;  Surgeon: Hessie Knows, MD;  Location: [facility] ORS;  Service: Orthopedics;  Laterality: Right;   SHOULDER ARTHROSCOPY Right    THORACIC AORTA - CAROTID ANGIOGRAM  October 2007   Dr. 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 Dilated left atrium.   TRANSTHORACIC ECHOCARDIOGRAM  04/2019   [facility] April 20, 2019): EF 55-60 %.  Mild LVH.  Relatively normal valves.    There were no vitals filed for this visit.   Subjective Assessment - 06/28/20 1327     Subjective Patient presents with significant other and new aide. Is aware today is his last day.    Pertinent History Patient is a pleasant 86 year old male who presents to PT for his LSVT eval with his significant other. Patient has been seen by this therapist in the past for balance, back pain, and strength. History of hallucinations, kidney stones, kyphoplasty T12, L3, L4, L5 performed in August 2020 by Dr. Rudene Christians. [facility] from [facility] dated 03/28/2019 report was reviewed today. Acute compression fractures are noted at T12, L3, and L4.PMH includes AAA, arthritis, atherosclerotic heart disease (s/p CABG), benign neoplasm of ascending/descending colon, COPD, CAD, dementia, dysrhythmia, falls, GERD, hemorrhoid, HOH, HTN, PAF, prostate cancer, osteoporosis, kidney stones, macular degeneration, Lewy Body Dementia.    Limitations Walking;Standing;House hold activities;Other (comment);Lifting;Sitting    How long can you sit comfortably? n/a    How long can you stand comfortably? needs a walker now, unsteady once standing    How long can you walk comfortably? uses a walker now; can walk in house.    Diagnostic tests MRI from [facility] dated 03/28/2019 report was reviewed today. Acute compression fractures are noted at T12, L3, and L4. He has facet arthropathy at the lower lumbar levels. At L3-4 he has moderate central stenosis.    Patient Stated Goals be more independent with ADLs and mobility    Currently in Pain? No/denies              Goals:  TUG: 14. 6 seconds FOTO: 53.2%     Treatment: Patient seen for LSVT Daily Session Maximal Daily Exercises for facilitation/coordination of movement Sustained movements are designed to rescale the amplitude of movement output for generalization to daily functional activities .Performed as follows for 1 set of 10 repetitions each multidirectional sustained movements  1) Floor to ceiling , cues to hold for 10 seconds,  needs modeling for correct positions, VC to reach out furtherand to reach up to the ceiling; 8x 2) Side to side multidirectional Repetitive movements performed in sittingand are designed to provide retraining effort needed for sustained muscle activation in tasks, cues to lean fwd and hold position x 10 counts: Patient does not movesideways welland is not able to transition or stretch out leg very far; 8x each side; more challenging to R side 3) Step and reach forward , cues for good knee  flex, cues to move UE and LEwith tactilecues required to bring arm into abduction andto rotate arms up to pronate his forearms 4) Step and reach backwards , cues for BUE back, getting toe up and bending back knee 5) Step and reach sideways, cues to turn head sideways, and turn head back to neutral, and to rotate arms and pronate forearms 6) Rock and reach forward/backward , cues to reach far fwd with UE's, patient not comfortable with feet apart, not rocking very far to get a good weight shift, not able to raise heel or raise toes much 7) Rock and reach sideways, cues for twist and look behind, only rotates side ways, unable to twistfeet at this time.  functional component task with supervision 5 reps and simulated activities for: 1. Sit to standx5needs cues to begin with UE up for correct start position 2.enter/exitroom 5x.  3.putting on/off coat 5x, requires assistance only once 4. Button jackets buttons 5x  5.Large step with rollatorand upright posturex5 each LE  Patient performed with instruction, verbal cues, tactile cues of therapist: goal:increase tissue extensibility, promote proper posture, improve mobility  Additional exercises: -sit to stand reach for PT hands for double hi five 10x.  -unlock/lock rollator 5x  Ambulate>683f with cues for big step and foot clearance, with improved foot clearance with contact cue of BIG. With rollator and close CGA;walking  into/out of rooms and into/out of hallways, negotiating obstacles and people.Two seated rest breaks    Pt presents with slowness of movements and fatigues with therapeutic exercises. Patient needs verbal, tactile and modeling cues with LSVT BIG exercises. Patient demonstrates difficulty with remembering to hold the seated exercises and the start and finish positions. Patient tolerated all interventions well this date and will benefit from continued skilled PT interventions to improve strength and balance and decrease risk of falling                      PT Education - 06/28/20 1328    Education provided Yes    Education Details discharge LSVT    Person(s) Educated Patient    Methods Explanation;Demonstration;Tactile cues;Verbal cues    Comprehension Verbalized understanding;Returned demonstration;Verbal cues required;Tactile cues required            PT Short Term Goals - 06/28/20 1409      PT SHORT TERM GOAL #1   Title Patient will be independent in home exercise program to improve strength/mobility for better functional independence with ADLs.    Baseline 10/4: will start program tomorrow 10/20: performing but requires assistance 10/28 required assistance    Time 2    Period Weeks    Status Not Met    Target Date 07/02/20             PT Long Term Goals - 06/28/20 1409      PT LONG TERM GOAL #1   Title Patient will increase FOTO score to equal to or greater than 52% to demonstrate statistically significant improvement in mobility and quality of life.    Baseline 10/4: 47.3% 10/20: 63.7% 10/28: 53.2 %    Time 4    Period Weeks    Status Achieved      PT LONG TERM GOAL #2   Title Patient will demonstrate an improved Berg Balance Score of > 41/56 as to demonstrate improved balance with ADLs such as sitting/standing and transfer balance and reduced fall risk.    Baseline 10/4: 35/56 10/20: 42/56    Time 4  Period Weeks    Status Achieved      PT  LONG TERM GOAL #3   Title Client will be able to get up from soft surfaces, including couch and reclinerin his home confidently on his 1st trial 100% of the time to allow him to safely access all areas of his home.    Baseline 10/4: needs help to get up 10/20: able to perform 50-60% of the time 10/28: able to perform 80% of the time    Time 4    Period Weeks    Status Partially Met      PT LONG TERM GOAL #4   Title Patient will improve his functional mobility be decreasing his cognitive TUG to < 15 seconds for improved mobility, stability, and safety with dual task to decrease fall risk.    Baseline 10/4: unable to perform cognitive aspect of test ( 29.19) 10/20: unable to perform cognitive portion 17.4 seconds wiht standard portion; 10/28: 14.6 % standard TUG; unable to perform dual task    Time 4    Period Weeks    Status Partially Met                 Plan - 06/28/20 1408    Clinical Impression Statement Patient is aware today is his last session of LSVT. Patient's mobility has progressed however cognition continued to be a barrier throughout entire time due to memory and mental status. Patient unable to dual task and requires max visual cueing. ADLs including donning/doffing clothing has improved with decreased need for assistance. Aide educated on daily exercises.  I will be happy to see patient again in the future as needed.    Personal Factors and Comorbidities Age;Comorbidity 3+;Fitness;Past/Current Experience;Sex;Social Background;Time since onset of injury/illness/exacerbation;Transportation    Comorbidities AAA, arthritis, atherosclerotic heart disease, (s/p CABG), benign neoplasm of ascending/descending colon, COPD, CAD, dementia, dysrhythmia, frequent falls, GERD, hemorrhoid, HOH, HTN, PAF, prostate cancer.    Examination-Activity Limitations Bathing;Bend;Carry;Dressing;Continence;Stairs;Squat;Reach Overhead;Locomotion Level;Lift;Stand;Transfers;Toileting;Bed Mobility     Examination-Participation Restrictions Church;Cleaning;Community Activity;Driving;Interpersonal Relationship;Laundry;Volunteer;Shop;Personal Finances;Meal Prep;Yard Work    [employer] Evolving/Moderate complexity    Rehab Potential Fair    PT Frequency 4x / week    PT Duration 4 weeks    PT Treatment/Interventions ADLs/Self Care Home Management;Aquatic Therapy;Cryotherapy;Moist Heat;Traction;Ultrasound;Therapeutic activities;Functional mobility training;Stair training;Gait training;DME Instruction;Therapeutic exercise;Balance training;Neuromuscular re-education;Patient/family education;Manual techniques;Passive range of motion;Energy conservation;Vestibular;Taping;Biofeedback;Electrical Stimulation;Iontophoresis 17m/ml Dexamethasone;Dry needling;Visual/perceptual remediation/compensation;Cognitive remediation    Consulted and Agree with Plan of Care Patient;Family member/caregiver    Family Member Consulted significant other           Patient will benefit from skilled therapeutic intervention in order to improve the following deficits and impairments:  Abnormal gait, Cardiopulmonary status limiting activity, Decreased activity tolerance, Decreased balance, Decreased knowledge of precautions, Decreased endurance, Decreased coordination, Decreased cognition, Decreased knowledge of use of DME, Decreased mobility, Difficulty walking, Decreased safety awareness, Decreased strength, Impaired flexibility, Impaired perceived functional ability, Impaired sensation, Postural dysfunction, Improper body mechanics, Pain, Impaired vision/preception, Decreased range of motion  Visit Diagnosis: Unsteadiness on feet  Other abnormalities of gait and mobility  Muscle weakness (generalized)     Problem List Patient Active Problem List   Diagnosis Date Noted   LBD (Lewy body dementia) ([place] 05/14/2020   Hematuria    Acute kidney injury superimposed on CKD ([place]    Sepsis due to  gram-negative UTI ([place] 12/26/2019   Left nephrolithiasis 12/26/2019   Hydronephrosis, left 12/26/2019   Status post abdominal aortic aneurysm (AAA) repair 04/15/2019  Malignant neoplasm of descending colon ([place])    Do not intubate, cardiopulmonary resuscitation (CPR)-only code status 10/08/2018   Prostate cancer ([place]) 02/25/2018   Prostatic cyst 01/15/2018   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   Blood in stool    Abnormal feces    Stricture and stenosis of esophagus    Mild cognitive impairment 08/18/2017   Senile purpura ([place]) 04/29/2017   Anemia 04/29/2017   Frequent falls 04/29/2017   Simple chronic bronchitis ([place]) 12/17/2016   Benign localized hyperplasia of prostate with urinary obstruction 07/15/2016   Elevated PSA 07/15/2016   Incomplete emptying of bladder 07/15/2016   Urge incontinence 07/15/2016   Hypothyroidism 04/25/2016   Macular degeneration 04/25/2016   Erectile dysfunction due to arterial insufficiency    Ischemic heart disease with chronotropic incompetence    CN (constipation) 02/10/2015   DD (diverticular disease) 02/10/2015   Weakness 44/81/8563   Lichen planus 14/97/0263   Restless leg 02/10/2015   Circadian rhythm disorder 02/10/2015   B12 deficiency 02/10/2015   COPD (chronic obstructive pulmonary disease) ([place]) 11/14/2014   Post Inflammatory Lung Changes 11/14/2014   Acute delirium 11/08/2014   PAF (paroxysmal atrial fibrillation) (HCC) CHA2DS2-VASc = 4. AC = Eliquis    Insomnia 12/09/2013   OSA on CPAP    Dyspnea on exertion - -essentially resolved with BB dose reduction & wgt loss 03/29/2013   Overweight (BMI 25.0-29.9) -- Wgt back up 01/17/2013   Atherosclerotic heart disease of native coronary artery without angina pectoris - s/p CABG, occluded SVG-D1; patent LIMA-LAD, SVG-OM, SVG- RCA    Essential hypertension    Hyperlipidemia with target LDL less than 70    Aorto-iliac disease ([place])     BCC (basal cell carcinoma), eyelid 01/03/2013   Allergic rhinitis 08/17/2009   Adaptation reaction 05/28/2009   Acid reflux 05/28/2009   Arthritis, degenerative 05/28/2009   Abnormality of aortic arch branch 06/01/2006   Carotid artery occlusion without infarction 06/01/2006   PAD (peripheral artery disease) - bilateral common iliac stents 12/15/2005   Atherosclerotic heart disease of artery bypass graft 09/01/1997   Janna Arch, PT, DPT   06/28/2020, 2:15 PM  [place] MAIN [facility] SERVICES [place], [place], [address] Phone: [phone]   Fax:  [phone]  Name: [name] MRN: [id number] Date of Birth: [birth year]

## 2020-06-29 ENCOUNTER — Ambulatory Visit (INDEPENDENT_AMBULATORY_CARE_PROVIDER_SITE_OTHER): Payer: Medicare Other

## 2020-06-29 ENCOUNTER — Other Ambulatory Visit: Payer: Self-pay

## 2020-06-29 ENCOUNTER — Ambulatory Visit (INDEPENDENT_AMBULATORY_CARE_PROVIDER_SITE_OTHER): Payer: Medicare Other | Admitting: Vascular Surgery

## 2020-06-29 ENCOUNTER — Encounter (INDEPENDENT_AMBULATORY_CARE_PROVIDER_SITE_OTHER): Payer: Self-pay | Admitting: Vascular Surgery

## 2020-06-29 VITALS — BP 114/70 | HR 76 | Ht 70.0 in | Wt 174.0 lb

## 2020-06-29 DIAGNOSIS — I6523 Occlusion and stenosis of bilateral carotid arteries: Secondary | ICD-10-CM

## 2020-06-29 DIAGNOSIS — I10 Essential (primary) hypertension: Secondary | ICD-10-CM | POA: Diagnosis not present

## 2020-06-29 DIAGNOSIS — Z9889 Other specified postprocedural states: Secondary | ICD-10-CM

## 2020-06-29 DIAGNOSIS — I7409 Other arterial embolism and thrombosis of abdominal aorta: Secondary | ICD-10-CM

## 2020-06-29 DIAGNOSIS — I714 Abdominal aortic aneurysm, without rupture, unspecified: Secondary | ICD-10-CM

## 2020-06-29 DIAGNOSIS — J432 Centrilobular emphysema: Secondary | ICD-10-CM

## 2020-06-29 DIAGNOSIS — I480 Paroxysmal atrial fibrillation: Secondary | ICD-10-CM | POA: Diagnosis not present

## 2020-06-29 DIAGNOSIS — I779 Disorder of arteries and arterioles, unspecified: Secondary | ICD-10-CM

## 2020-06-29 DIAGNOSIS — Z8679 Personal history of other diseases of the circulatory system: Secondary | ICD-10-CM | POA: Diagnosis not present

## 2020-06-29 DIAGNOSIS — I739 Peripheral vascular disease, unspecified: Secondary | ICD-10-CM

## 2020-06-29 NOTE — Progress Notes (Signed)
MRN : [id number]  [name] is a 86 y.o. (1933/10/19) male who presents with chief complaint of  Chief Complaint  Patient presents with  . Follow-up    55moU/S follow up  .  History of Present Illness: Patient returns today in follow up of his AAA.  He is about a year status post endovascular abdominal aortic aneurysm repair.  He is doing well with no current aneurysm related symptoms. Specifically, the patient denies new back or abdominal pain, or signs of peripheral embolization.  His aortic duplex today shows a 3.6 cm aneurysm sac with no evidence of endoleak.  The stent graft is patent without any flow issues. His wife, who provides much of the history, also reports that he has previously been followed for PAD and carotid disease by a cardiologist in [name]  That has not been checked in well over 2 years at this point.  Some of that was delayed with Covid but the last couple of checks with the cardiologist have been virtual visits without studies.  She is concerned about this.  He has previously had endarterectomies on both sides.  Looking at his last study in 2019 he had no disease on the left with mild to mild to moderate disease on the right.  He also had relatively well-preserved ABIs 2 years ago.  He does not have any current rest pain or ulceration.  Current Outpatient Medications  Medication Sig Dispense Refill  . acetaminophen (TYLENOL) 500 MG tablet Take 500-1,000 mg by mouth every 6 (six) hours as needed for mild pain or moderate pain.     . Ascorbic Acid (VITAMIN C) 1000 MG tablet Take 1,000 mg by mouth daily.    . Cyanocobalamin (VITAMIN B-12) 500 MCG SUBL Place 500 mcg under the tongue daily.     .Marland KitchenELIQUIS 5 MG TABS tablet TAKE 1 TABLET BY MOUTH TWICE DAILY 180 tablet 1  . ezetimibe (ZETIA) 10 MG tablet TAKE 1 TABLET BY MOUTH DAILY 90 tablet 0  . finasteride (PROSCAR) 5 MG tablet TAKE ONE TABLET BY MOUTH EVERY DAY 90 tablet 3  . fluticasone (FLONASE) 50 MCG/ACT  nasal spray PLACE TWO SPRAYS IN BOTH NOSRTILS EVERY DAY AS NEEDED FOR ALLERGIES 16 g 4  . Magnesium Oxide 500 MG TABS Take 500 mg by mouth every evening.    . melatonin 3 MG TABS tablet Take 0.5 tablets (1.5 mg total) by mouth at bedtime.  0  . metoprolol succinate (TOPROL-XL) 50 MG 24 hr tablet TAKE ONE TABLET BY MOUTH EVERY DAY TAKE WITH OR IMMEDIATELY FOLLOWING A MEAL 90 tablet 0  . Multiple Vitamins-Minerals (PRESERVISION AREDS 2 PO) Take 1 tablet by mouth 2 (two) times daily.     .Marland KitchenMYRBETRIQ 50 MG TB24 tablet TAKE ONE TABLET BY MOUTH EVERY DAY 30 tablet 11  . nitroGLYCERIN (NITROSTAT) 0.4 MG SL tablet Place 1 tablet (0.4 mg total) under the tongue every 5 (five) minutes as needed for chest pain. 20 tablet 1  . pantoprazole (PROTONIX) 40 MG tablet TAKE ONE TABLET BY MOUTH EVERY DAY 90 tablet 1  . QUEtiapine (SEROQUEL) 25 MG tablet Take 12.5 mg by mouth.     . rosuvastatin (CRESTOR) 20 MG tablet TAKE ONE TABLET BY MOUTH EVERY DAY 90 tablet 4  . umeclidinium-vilanterol (ANORO ELLIPTA) 62.5-25 MCG/INH AEPB Inhale 1 puff into the lungs daily.     .Marland Kitchenlisinopril (ZESTRIL) 2.5 MG tablet Take 1 tablet (2.5 mg total) by mouth daily.  No current facility-administered medications for this visit.    Past Medical History:  Diagnosis Date  . AAA (abdominal aortic aneurysm) ([place]) 05/2019   Relativley Rapid progression from June-Aug 2020 (4.6 - 5.4 cm): s/p EVAR (Dr. Lucky Cowboy [facility]): EVAR - 23 mm prox, 12 cm distal & x 18 cm length Gore Excluder Endoprosthesis Main body (via RFA distal to lowest Renal A); 67m x 14 cm L Contralateral Limb for L Iliac - extended with 8 mm x 6cm LifeStar stent (post-dilated with 7 mm DEB).  Additional R Iliac PTA not required.   . Arthritis   . Basal cell carcinoma of eyelid 01/03/2013   2019 - R side of Nose  . Benign neoplasm of ascending colon   . Benign neoplasm of descending colon   . Bilateral iliac artery stenosis ([place] 2007   Bilateral Iliac A stenting; extension  of EVAR limbs into both Iliacs -- additional stent placed in L Iliac.  . Cancer ([place]    Skin cancer  . Colon polyps   . Complication of anesthesia    severe confusion agitation requiring hospital admission x 4 days  . Compression fracture of fourth lumbar vertebra ([place] 04[place]   - s/p Kyphoplasty; T12, L3-5 ([facility]  . COPD (chronic obstructive pulmonary disease) (HCC)    Centrilobular Emphysema  . Coronary artery disease involving native coronary artery without angina pectoris 1998   - s/p CABG, occluded SVG-D1; patent LIMA-LAD, SVG-OM, SVG- RCA  . Dementia ([place]    memory loss - started noting late 2019  . Falls frequently    broke foot  . Fracture 2021   left foot  . GERD (gastroesophageal reflux disease)   . Hemorrhoid 02/10/2015  . History of hiatal hernia   . History of kidney stones   . HOH (hard of hearing)   . Hypertension   . Lewy body dementia ([place] 04/04/2020  . Macular degeneration disease   . Osteoporosis   . PAF (paroxysmal atrial fibrillation) ([place]    on Eliquis for OAC - Rate Control   . Prostate cancer ([place]   . Prostate disorder   . Sciatic pain    Chronic  . Sleep apnea    cpap  . Temporary cerebral vascular dysfunction 02/10/2015   Had negative work-up.  July, 2012.  No medication changes, had forgot them that day, got dehydrated.   . Urinary incontinence     Past Surgical History:  Procedure Laterality Date  . ABDOMINAL AORTIC ENDOVASCULAR STENT GRAFT Bilateral 05/18/2019   Procedure: ABDOMINAL AORTIC ENDOVASCULAR STENT GRAFT;  Surgeon: DAlgernon Huxley MD;  Location: [facility] ORS;  Service: Vascular;  Laterality: Bilateral;  . ANKLE SURGERY Left    ORIF   . Arch aortogram and carotid aortogram  06/02/2006   Dr FOneida Alardid surgery  . BASAL CELL CARCINOMA EXCISION  04/2016   Dermatology  . CARDIAC CATHETERIZATION  01/30/1998    (Dr. HLinard Millers: Native LAD & mRCA CTO. 90% D1. Mod Cx. SVG-D1 CTO. SVG-RCA & SVG-OM along with LIMA-LAD patent;  LV NORMAL Fxn   . CAROTID ENDARTERECTOMY Left Oct. 29, 2007   Dr. FOneida Alar  . CATARACT EXTRACTION W/PHACO Right 03/05/2017   Procedure: CATARACT EXTRACTION PHACO AND INTRAOCULAR LENS PLACEMENT (IOC);  Surgeon: BLeandrew Koyanagi MD;  Location: [facility] ORS;  Service: Ophthalmology;  Laterality: Right;  UK[place]00:58.5 AP% 10.6 CDE 6.20 Fluid Pack lot # [id number]  . CATARACT EXTRACTION W/PHACO Left 04/15/2017   Procedure: CATARACT EXTRACTION PHACO AND INTRAOCULAR LENS PLACEMENT (  IOC)  Left;  Surgeon: Leandrew Koyanagi, MD;  Location: [place];  Service: Ophthalmology;  Laterality: Left;  . COLONOSCOPY WITH PROPOFOL N/A 09/15/2017   Procedure: COLONOSCOPY WITH PROPOFOL;  Surgeon: Lucilla Lame, MD;  Location[facility] END[facility]COPY;  Service: Endoscopy;  Laterality: N/A;  . COLONOSCOPY WITH PROPOFOL N/A 10/26/2018   Procedure: COLONOSCOPY WITH PROPOFOL;  Surgeon: Lucilla Lame, MD;  Location: [facility] ENDOSCOPY;  Service: Endoscopy;  Laterality: N/A;  . CORONARY ARTERY BYPASS GRAFT  1998   LIMA-LAD, SVG-OM, SVG-RPDA, SVG-DIAG  . CPET / MET  12/2012   Mild chronotropic incompetence - read 82% of predicted; also reduced effort; peak VO2 15.7 / 75% (did not reach Max effort) -- suggested ischemic response in last 1.5 minutes of exercise. Normal pulmonary function on PFTs but poor response to  . CYSTOSCOPY W/ RETROGRADES Left 01/13/2020   Procedure: CYSTOSCOPY WITH RETROGRADE PYELOGRAM;  Surgeon: Billey Co, MD;  Location: [facility] ORS;  Service: Urology;  Laterality: Left;  . CYSTOSCOPY WITH STENT PLACEMENT Left 12/26/2019   Procedure: CYSTOSCOPY WITH STENT PLACEMENT;  Surgeon: Billey Co, MD;  Location: [facility] ORS;  Service: Urology;  Laterality: Left;  . CYSTOSCOPY/URETEROSCOPY/HOLMIUM LASER/STENT PLACEMENT Left 01/13/2020   Procedure: CYSTOSCOPY/URETEROSCOPY/HOLMIUM LASER/STENT EXCHANGE;  Surgeon: Billey Co, MD;  Location: [facility] ORS;  Service: Urology;  Laterality: Left;  . ENDOVASCULAR REPAIR/STENT GRAFT N/A  05/18/2019   Procedure: ENDOVASCULAR REPAIR/STENT GRAFT;  Surgeon: Algernon Huxley, MD;  [facility]: [facility]IVE [facility] LAB;; EVAR - 23 mm prox, 12 cm distal & x 18 cm length Gore Excluder Endoprosthesis Main body (via RFA distal to lowest Renal A); 41m x 14 cm L Contralateral Limb for L Iliac - extended with 8 mm x 6cm LifeStar stent; no additional R Iliac PTA needed.   . ESOPHAGOGASTRODUODENOSCOPY (EGD) WITH PROPOFOL N/A 09/15/2017   Procedure: ESOPHAGOGASTRODUODENOSCOPY (EGD) WITH PROPOFOL;  Surgeon: WLucilla Lame MD;  [facility];  Service: Endoscopy;  Laterality: N/A;  . FRACTURE SURGERY Right 03/19/2015   wrist  . FRACTURE SURGERY Left   . HIATAL HERNIA REPAIR    . ILIAC ARTERY STENT  12/15/2005   PTA and direct stenting rgt and lft common iliac arteries  . KYPHOPLASTY N/A 04/05/2019   Procedure: KYPHOPLASTY T12, L3, L4 L5;  Surgeon: MHessie Knows MD;  Location: [facility] ORS;  Service: Orthopedics;  Laterality: N/A;  . NM GATED MYOVIEW ([place])  05/'19; 8/'20   a) [facility]: Low Risk - no ischemia or infarct.  EF > 65%. no RWMA;; b) ([facility]: Normal wall motion.  No ischemia or infarction.  . OPEN REDUCTION INTERNAL FIXATION (ORIF) DISTAL RADIAL FRACTURE Left 01/13/2019   Procedure: OPEN REDUCTION INTERNAL FIXATION (ORIF) DISTAL  RADIAL FRACTURE - LEFT - SLEEP APNEA;  Surgeon: MHessie Knows MD;  Location: [facility] ORS;  Service: Orthopedics;  Laterality: Left;  . ORIF ELBOW FRACTURE Right 01/01/2017   Procedure: OPEN REDUCTION INTERNAL FIXATION (ORIF) ELBOW/OLECRANON FRACTURE;  Surgeon: MHessie Knows MD;  Location: [facility];  Service: Orthopedics;  Laterality: Right;  . ORIF WRIST FRACTURE Right 03/19/2015   Procedure: OPEN REDUCTION INTERNAL FIXATION (ORIF) WRIST FRACTURE;  Surgeon: MHessie Knows MD;  Location: [facility] OR[facility];  Service: Orthopedics;  Laterality: Right;  . SHOULDER ARTHROSCOPY Right   . THORACIC AORTA - CAROTID ANGIOGRAM  October 2007   Dr. 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 TRANSTHORACIC ECHOCARDIOGRAM  February 2016   [facility]: Normal LV function.  Dilated left atrium.  Domingo Dimes ECHOCARDIOGRAM  04/2019   [facility] April 20, 2019): EF 55-60 %.  Mild LVH.  Relatively normal valves.     Social History   Tobacco Use  . Smoking status: Former Smoker    Types: Cigarettes    Quit date: 08/04/1997    Years since quitting: 22.9  . Smokeless tobacco: Never Used  Vaping Use  . Vaping Use: Never used  Substance Use Topics  . Alcohol use: No  . Drug use: No     Family History  Problem Relation Age of Onset  . Ulcers Mother        Peptic  . Dementia Mother   . Alcohol abuse Father      Allergies  Allergen Reactions  . Clonazepam Other (See Comments)    Altered mental status  . Codeine Other (See Comments)    Altered mental status.  . Ropinirole Swelling    Other reaction(s): Hallucination  . Hydrocodone     confusion  . Niacin And Related Other (See Comments)    Flushing of skin  . Oxycodone Other (See Comments)    confusion confusion  . Tramadol     Other reaction(s): Hallucination     REVIEW OF SYSTEMS (Negative unless checked)  Constitutional: '[]' ?Weight loss  '[]' ?Fever  '[]' ?Chills Cardiac: '[]' ?Chest pain   '[]' ?Chest pressure   '[x]' ?Palpitations   '[]' ?Shortness of breath when laying flat   '[]' ?Shortness of breath at rest   '[x]' ?Shortness of breath with exertion. Vascular:  '[x]' ?Pain in legs with walking   '[]' ?Pain in legs at rest   '[]' ?Pain in legs when laying flat   '[]' ?Claudication   '[]' ?Pain in feet when walking  '[]' ?Pain in feet at rest  '[]' ?Pain in feet when laying flat   '[]' ?History of DVT   '[]' ?Phlebitis   '[]' ?Swelling in legs   '[]' ?Varicose veins   '[]' ?Non-healing ulcers Pulmonary:   '[]' ?Uses home oxygen   '[]' ?Productive cough   '[]' ?Hemoptysis   '[]' ?Wheeze  '[x]' ?COPD   '[]' ?Asthma Neurologic:  '[]' ?Dizziness  '[]' ?Blackouts   '[]' ?Seizures   '[]' ?History of  stroke   '[]' ?History of TIA  '[]' ?Aphasia   '[]' ?Temporary blindness   '[]' ?Dysphagia   '[]' ?Weakness or numbness in arms   '[]' ?Weakness or numbness in legs X positive for memory loss Musculoskeletal:  '[x]' ?Arthritis   '[]' ?Joint swelling   '[]' ?Joint pain   '[x]' ?Low back pain Hematologic:  '[]' ?Easy bruising  '[]' ?Easy bleeding   '[]' ?Hypercoagulable state   '[]' ?Anemic  '[]' ?Hepatitis Gastrointestinal:  '[]' ?Blood in stool   '[]' ?Vomiting blood  '[]' ?Gastroesophageal reflux/heartburn   '[]' ?Abdominal pain Genitourinary:  '[]' ?Chronic kidney disease   '[x]' ?Difficult urination  '[]' ?Frequent urination  '[]' ?Burning with urination   '[]' ?Hematuria Skin:  '[]' ?Rashes   '[]' ?Ulcers   '[]' ?Wounds Psychological:  '[]' ?History of anxiety   '[]' ? History of major depression.  Physical Examination  BP 114/70   Pulse 76   Ht '5\' 10"'  (1.778 m)   Wt 174 lb (78.9 kg)   BMI 24.97 kg/m  Gen:  WD/WN, NAD Head: [place]/AT, No temporalis wasting. Ear/Nose/Throat: Hearing grossly intact, nares w/o erythema or drainage Eyes: Conjunctiva clear. Sclera non-icteric Neck: Supple.  Trachea midline Pulmonary:  Good air movement, no use of accessory muscles.  Cardiac: irregular Vascular:  Vessel Right Left  Radial Palpable Palpable                          PT 1+ Palpable 1+ Palpable  DP 1+ Palpable 2+ Palpable   Gastrointestinal:  soft, non-tender/non-distended. No increased aortic impulse Musculoskeletal: M/S 5/5 throughout.  No deformity or atrophy. Trace LE edema. Neurologic: Sensation grossly intact in extremities.  Symmetrical.  Speech is fluent.  Psychiatric: Judgment intact, Mood & affect appropriate for pt's clinical situation. Dermatologic: No rashes or ulcers noted.  No cellulitis or open wounds.       Labs Recent Results (from the past 2160 hour(s))  Basic metabolic panel     Status: Abnormal   Collection Time: 04/04/20  7:05 PM  Result Value Ref Range   Sodium 139 135 - 145 mmol/L   Potassium 4.1 3.5 - 5.1 mmol/L   Chloride 107 98 - 111  mmol/L   CO2 24 22 - 32 mmol/L   Glucose, Bld 112 (H) 70 - 99 mg/dL    Comment: Glucose reference range applies only to samples taken after fasting for at least 8 hours.   BUN 14 8 - 23 mg/dL   Creatinine, Ser 0.95 0.61 - 1.24 mg/dL   Calcium 9.2 8.9 - 10.3 mg/dL   GFR calc non Af Amer >60 >60 mL/min   GFR calc Af Amer >60 >60 mL/min   Anion gap 8 5 - 15    Comment: Performed at [facility], [address] [place], [place] [address]  CBC     Status: Abnormal   Collection Time: 04/04/20  7:05 PM  Result Value Ref Range   WBC 7.5 4.0 - 10.5 K/uL   RBC 4.12 (L) 4.22 - 5.81 MIL/uL   Hemoglobin 12.6 (L) 13.0 - 17.0 g/dL   HCT 38.2 (L) 39 - 52 %   MCV 92.7 80.0 - 100.0 fL   MCH 30.6 26.0 - 34.0 pg   MCHC 33.0 30.0 - 36.0 g/dL   RDW 14.2 11.5 - 15.5 %   Platelets 158 150 - 400 K/uL   nRBC 0.0 0.0 - 0.2 %    Comment: Performed at [facility], [address] [place], [place] [address]  CBC     Status: Abnormal   Collection Time: 05/14/20 11:33 AM  Result Value Ref Range   WBC 7.2 3.4 - 10.8 x10E3/uL   RBC 3.78 (L) 4.14 - 5.80 x10E6/uL   Hemoglobin 11.8 (L) 13.0 - 17.7 g/dL   Hematocrit 35.0 (L) 37.5 - 51.0 %   MCV 93 79 - 97 fL   MCH 31.2 26.6 - 33.0 pg   MCHC 33.7 31 - 35 g/dL   RDW 13.5 11.6 - 15.4 %   Platelets 135 (L) 150 - 450 x10E3/uL  Comprehensive metabolic panel     Status: None   Collection Time: 05/14/20 11:33 AM  Result Value Ref Range   Glucose 91 65 - 99 mg/dL   BUN 14 8 - 27 mg/dL   Creatinine, Ser 0.98 0.76 - 1.27 mg/dL   GFR calc non Af Amer 71 >59 mL/min/1.73   GFR calc Af Amer 82 >59 mL/min/1.73    Comment: **[facility] currently reports eGFR in compliance with the current**   recommendations of the [employer]. [facility] will   update reporting as new guidelines are published from the NKF-ASN   Task force.    BUN/Creatinine Ratio 14 10 - 24   Sodium 140 134 - 144 mmol/L   Potassium 4.2 3.5 - 5.2 mmol/L   Chloride 105 96  - 106 mmol/L   CO2 22 20 - 29 mmol/L   Calcium 9.1 8.6 - 10.2 mg/dL   Total Protein 6.4 6.0 - 8.5  g/dL   Albumin 4.0 3.6 - 4.6 g/dL   Globulin, Total 2.4 1.5 - 4.5 g/dL   Albumin/Globulin Ratio 1.7 1.2 - 2.2   Bilirubin Total 0.8 0.0 - 1.2 mg/dL   Alkaline Phosphatase 118 44 - 121 IU/L    Comment:               **Please note reference interval change**   AST 12 0 - 40 IU/L   ALT 7 0 - 44 IU/L  TSH     Status: None   Collection Time: 05/14/20 11:33 AM  Result Value Ref Range   TSH 2.940 0.450 - 4.500 uIU/mL  PSA     Status: None   Collection Time: 05/31/20  1:33 PM  Result Value Ref Range   Prostatic Specific Antigen <0.01 0.00 - 4.00 ng/mL    Comment: (NOTE) While PSA levels of <=4.0 ng/ml are reported as reference range, some men with levels below 4.0 ng/ml can have prostate cancer and many men with PSA above 4.0 ng/ml do not have prostate cancer.  Other tests such as free PSA, age specific reference ranges, PSA velocity and PSA doubling time may be helpful especially in men less than 21 years old. Performed at [facility], [address], [place], [place] [address]     Radiology No results found.  Assessment/Plan Essential hypertension blood pressure control important in reducing the progression of atherosclerotic disease. On appropriate oral medications.   PAF (paroxysmal atrial fibrillation) (HCC) CHA2DS2-VASc = 4. AC = Eliquis On anticoagulation and rate controlled  COPD (chronic obstructive pulmonary disease) Preoperative assessment prior to surgery may be necessary based off of his preop visits.  PAD (peripheral artery disease) - bilateral common iliac stents Previous aortoiliac intervention.  Not checked in two years. Recheck at his convenience.  Continue current medical regimen.  AAA (abdominal aortic aneurysm) without rupture (HCC) His aortic duplex today shows a 3.6 cm aneurysm sac with no evidence of endoleak.  The stent graft is patent without  any flow issues.  Doing well 1 year status post repair.  Continue to follow on an annual basis.  Carotid artery occlusion without infarction The last time this was checked, he had mild to moderate disease on the right and no disease on the left after endarterectomy.  This has not been checked in over 2 years.  We can check this at his convenience in the near future.    Leotis Pain, MD  06/29/2020 9:49 AM    This note was created with Dragon medical transcription system.  Any errors from dictation are purely unintentional

## 2020-06-29 NOTE — Assessment & Plan Note (Signed)
The last time this was checked, he had mild to moderate disease on the right and no disease on the left after endarterectomy.  This has not been checked in over 2 years.  We can check this at his convenience in the near future.

## 2020-06-29 NOTE — Assessment & Plan Note (Signed)
His aortic duplex today shows a 3.6 cm aneurysm sac with no evidence of endoleak.  The stent graft is patent without any flow issues.  Doing well 1 year status post repair.  Continue to follow on an annual basis.

## 2020-06-29 NOTE — Assessment & Plan Note (Addendum)
Previous aortoiliac intervention.  Not checked in two years. Recheck at his convenience.  Continue current medical regimen.

## 2020-07-04 ENCOUNTER — Ambulatory Visit: Payer: Medicare Other

## 2020-07-04 ENCOUNTER — Encounter: Payer: 59 | Admitting: Speech Pathology

## 2020-07-05 ENCOUNTER — Other Ambulatory Visit: Payer: Self-pay | Admitting: Family Medicine

## 2020-07-05 DIAGNOSIS — E785 Hyperlipidemia, unspecified: Secondary | ICD-10-CM

## 2020-07-12 ENCOUNTER — Ambulatory Visit: Payer: Medicare Other | Attending: Nurse Practitioner

## 2020-07-12 ENCOUNTER — Other Ambulatory Visit: Payer: Self-pay

## 2020-07-12 DIAGNOSIS — R2689 Other abnormalities of gait and mobility: Secondary | ICD-10-CM | POA: Insufficient documentation

## 2020-07-12 DIAGNOSIS — M6281 Muscle weakness (generalized): Secondary | ICD-10-CM | POA: Diagnosis present

## 2020-07-12 DIAGNOSIS — R2681 Unsteadiness on feet: Secondary | ICD-10-CM | POA: Diagnosis present

## 2020-07-12 NOTE — Patient Instructions (Signed)
Access Code: [email or web address] URL: [email or web address] Date: 07/12/2020 Prepared by: Janna Arch  Exercises Seated Long Arc Quad - 1 x daily - 7 x weekly - 2 sets - 10 reps - 5 hold Seated Hip Abduction with Resistance - 1 x daily - 7 x weekly - 2 sets - 10 reps - 5 hold Seated Heel Toe Raises - 1 x daily - 7 x weekly - 2 sets - 10 reps - 5 hold Standing March with Counter Support - 1 x daily - 7 x weekly - 2 sets - 10 reps - 5 hold Sit to Stand without Arm Support - 1 x daily - 7 x weekly - 2 sets - 10 reps - 5 hold

## 2020-07-12 NOTE — Therapy (Signed)
[place] MAIN [facility] SERVICES [address] [place], [place], [address] Phone: [phone]   Fax:  [phone]  Physical Therapy Evaluation  Patient Details  Name: [name] MRN: [id number] Date of Birth: 01/05/1934 Referring Provider (PT): Jennings Books K    Encounter Date: 07/12/2020   PT End of Session - 07/12/20 0957    Visit Number 1    Number of Visits 24    Date for PT Re-Evaluation 10/04/20    Authorization Type 1/10 eval 07/12/20    PT Start Time 0845    PT Stop Time 0931    PT Time Calculation (min) 46 min    Equipment Utilized During Treatment Gait belt    Activity Tolerance Patient tolerated treatment well;Patient limited by fatigue    Behavior During Therapy [facility] for tasks assessed/performed           Past Medical History:  Diagnosis Date   AAA (abdominal aortic aneurysm) ([place]) 05/2019   [place] Rapid progression from June-Aug 2020 (4.6 - 5.4 cm): s/p EVAR (Dr. Lucky Cowboy [facility]): EVAR - 23 mm prox, 12 cm distal & x 18 cm length Gore Excluder Endoprosthesis Main body (via RFA distal to lowest Renal A); 70m x 14 cm L Contralateral Limb for L Iliac - extended with 8 mm x 6cm LifeStar stent (post-dilated with 7 mm DEB).  Additional R Iliac PTA not required.    Arthritis    Basal cell carcinoma of eyelid 01/03/2013   2019 - R side of Nose   Benign neoplasm of ascending colon    Benign neoplasm of descending colon    Bilateral iliac artery stenosis ([place] 2007   Bilateral Iliac A stenting; extension of EVAR limbs into both Iliacs -- additional stent placed in L Iliac.   Cancer ([facility]    Skin cancer   Colon polyps    Complication of anesthesia    severe confusion agitation requiring hospital admission x 4 days   Compression fracture of fourth lumbar vertebra ([place] 04/2019   - s/p Kyphoplasty; T12, L3-5 (*[facility])   COPD (chronic obstructive pulmonary disease) (HCC)    Centrilobular Emphysema   Coronary artery  disease involving native coronary artery without angina pectoris 1998   - s/p CABG, occluded SVG-D1; patent LIMA-LAD, SVG-OM, SVG- RCA   Dementia ([place]    memory loss - started noting late 2019   Falls frequently    broke foot   Fracture 2021   left foot   GERD (gastroesophageal reflux disease)    Hemorrhoid 02/10/2015   History of hiatal hernia    History of kidney stones    HOH (hard of hearing)    Hypertension    Lewy body dementia ([place] 04/04/2020   Macular degeneration disease    Osteoporosis    PAF (paroxysmal atrial fibrillation) (HCC)    on Eliquis for OAC - Rate Control    Prostate cancer (HCC)    Prostate disorder    Sciatic pain    Chronic   Sleep apnea    cpap   Temporary cerebral vascular dysfunction 02/10/2015   Had negative work-up.  July, 2012.  No medication changes, had forgot them that day, got dehydrated.    Urinary incontinence     Past Surgical History:  Procedure Laterality Date   ABDOMINAL AORTIC ENDOVASCULAR STENT GRAFT Bilateral 05/18/2019   Procedure: ABDOMINAL AORTIC ENDOVASCULAR STENT GRAFT;  Surgeon: DAlgernon Huxley MD;  Location: [facility] ORS;  Service: Vascular;  Laterality: Bilateral;  ANKLE SURGERY Left    ORIF    Arch aortogram and carotid aortogram  06/02/2006   Dr Oneida Alar did surgery   BASAL CELL CARCINOMA EXCISION  04/2016   Dermatology   CARDIAC CATHETERIZATION  01/30/1998    (Dr. Linard Millers): Native LAD & mRCA CTO. 90% D1. Mod Cx. SVG-D1 CTO. SVG-RCA & SVG-OM along with LIMA-LAD patent;  LV NORMAL Fxn   CAROTID ENDARTERECTOMY Left Oct. 29, 2007   Dr. Oneida Alar:   CATARACT EXTRACTION W/PHACO Right 03/05/2017   Procedure: CATARACT EXTRACTION PHACO AND INTRAOCULAR LENS PLACEMENT ([place]);  Surgeon: Leandrew Koyanagi, MD;  Location: [facility] ORS;  Service: Ophthalmology;  Laterality: Right;  [place] 00:58.5 AP% 10.6 CDE 6.20 Fluid Pack lot # [id number]   CATARACT EXTRACTION W/PHACO Left 04/15/2017   Procedure: CATARACT EXTRACTION  PHACO AND INTRAOCULAR LENS PLACEMENT ([place])  Left;  Surgeon: Leandrew Koyanagi, MD;  Location: [place];  Service: Ophthalmology;  Laterality: Left;   COLONOSCOPY WITH PROPOFOL N/A 09/15/2017   Procedure: COLONOSCOPY WITH PROPOFOL;  Surgeon: Lucilla Lame, MD;  Location: [facility] END[facility]COPY;  Service: Endoscopy;  Laterality: N/A;   COLONOSCOPY WITH PROPOFOL N/A 10/26/2018   Procedure: COLONOSCOPY WITH PROPOFOL;  Surgeon: Lucilla Lame, MD;  Location: [facility] ENDOSCOPY;  Service: Endoscopy;  Laterality: N/A;   CORONARY ARTERY BYPASS GRAFT  1998   LIMA-LAD, SVG-OM, SVG-RPDA, SVG-DIAG   CPET / MET  12/2012   Mild chronotropic incompetence - read 82% of predicted; also reduced effort; peak VO2 15.7 / 75% (did not reach Max effort) -- suggested ischemic response in last 1.5 minutes of exercise. Normal pulmonary function on PFTs but poor response to   CYSTOSCOPY W/ RETROGRADES Left 01/13/2020   Procedure: CYSTOSCOPY WITH RETROGRADE PYELOGRAM;  Surgeon: Billey Co, MD;  Location: [facility] ORS;  Service: Urology;  Laterality: Left;   CYSTOSCOPY WITH STENT PLACEMENT Left 12/26/2019   Procedure: CYSTOSCOPY WITH STENT PLACEMENT;  Surgeon: Billey Co, MD;  Location: [facility] ORS;  Service: Urology;  Laterality: Left;   CYSTOSCOPY/URETEROSCOPY/HOLMIUM LASER/STENT PLACEMENT Left 01/13/2020   Procedure: CYSTOSCOPY/URETEROSCOPY/HOLMIUM LASER/STENT EXCHANGE;  Surgeon: Billey Co, MD;  Location: [facility] ORS;  Service: Urology;  Laterality: Left;   ENDOVASCULAR REPAIR/STENT GRAFT N/A 05/18/2019   Procedure: ENDOVASCULAR REPAIR/STENT GRAFT;  Surgeon: Algernon Huxley, MD;  Location: [facility] INVASIVE CV LAB;; EVAR - 23 mm prox, 12 cm distal & x 18 cm length Gore Excluder Endoprosthesis Main body (via RFA distal to lowest Renal A); 26m x 14 cm L Contralateral Limb for L Iliac - extended with 8 mm x 6cm LifeStar stent; no additional R Iliac PTA needed.    ESOPHAGOGASTRODUODENOSCOPY (EGD) WITH PROPOFOL N/A 09/15/2017    Procedure: ESOPHAGOGASTRODUODENOSCOPY (EGD) WITH PROPOFOL;  Surgeon: WLucilla Lame MD;  [facility];  Service: Endoscopy;  Laterality: N/A;   FRACTURE SURGERY Right 03/19/2015   wrist   FRACTURE SURGERY Left    HIATAL HERNIA REPAIR     ILIAC ARTERY STENT  12/15/2005   PTA and direct stenting rgt and lft common iliac arteries   KYPHOPLASTY N/A 04/05/2019   Procedure: KYPHOPLASTY T12, L3, L4 L5;  Surgeon: MHessie Knows MD;  Location: [facility] ORS;  Service: Orthopedics;  Laterality: N/A;   NM GATED MYOVIEW ([place])  05/'19; 8/'20   a) [facility]: Low Risk - no ischemia or infarct.  EF > 65%. no RWMA;; b) ([place]: Normal wall motion.  No ischemia or infarction.   OPEN REDUCTION INTERNAL FIXATION (ORIF) DISTAL RADIAL FRACTURE Left 01/13/2019   Procedure:  OPEN REDUCTION INTERNAL FIXATION (ORIF) DISTAL  RADIAL FRACTURE - LEFT - SLEEP APNEA;  Surgeon: Hessie Knows, MD;  Location: [facility] ORS;  Service: Orthopedics;  Laterality: Left;   ORIF ELBOW FRACTURE Right 01/01/2017   Procedure: OPEN REDUCTION INTERNAL FIXATION (ORIF) ELBOW/OLECRANON FRACTURE;  Surgeon: Hessie Knows, MD;  Location: [facility] ORS;  Service: Orthopedics;  Laterality: Right;   ORIF WRIST FRACTURE Right 03/19/2015   Procedure: OPEN REDUCTION INTERNAL FIXATION (ORIF) WRIST FRACTURE;  Surgeon: Hessie Knows, MD;  Location: [facility] ORS;  Service: Orthopedics;  Laterality: Right;   SHOULDER ARTHROSCOPY Right    THORACIC AORTA - CAROTID ANGIOGRAM  October 2007   Dr. 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 Dilated left atrium.   TRANSTHORACIC ECHOCARDIOGRAM  04/2019   [facility] April 20, 2019): EF 55-60 %.  Mild LVH.  Relatively normal valves.    There were no vitals filed for this visit.    Subjective Assessment - 07/12/20 0853    Subjective Patient  is returning to PT for evaluation of balance.    Patient is accompained by: --   aide   Pertinent History Patient is returning to PT for evaluation of balance. Patient has been seen by this therapist in the past. PMH AAA, arthritis, basal cell carcinoma of eyelid, benign neoplasm of ascending/descending colon, bilateral iliac artery stenosis, colon polyps, compression fx of 4th lumbar vertebra (s/p kyphoplasty T12, L3-5), COPD, CAD, dementia, falls,  GERD, hemorroids, HOH, HTN, Lewy Body Dementia, macular degeneration disease, osteoporosis, PAF, prostate cancer, prostate disorder, sciatic pain, sleep apnea, temporary cerebral vascular dysfunction. Comes with aide, having multiple near falls per day per aide, mainly to the sides.    Limitations Walking;Standing;House hold activities;Other (comment);Lifting;Sitting    How long can you sit comfortably? n/a back pain no longer limiting    How long can you stand comfortably? 10-12 with walker    How long can you walk comfortably? walks 15 minutes with a walker    Patient Stated Goals walk without stumbling, be able to be steady enough to not have to have someone holding his gait belt.    Currently in Pain? No/denies              [facility] PT Assessment - 07/12/20 0001      Assessment   Medical Diagnosis balance    Referring Provider (PT) Manuella Ghazi, Hemang K     Onset Date/Surgical Date --   years   Hand Dominance Right    Prior Therapy yes LSVT      Precautions   Precautions Fall      Restrictions   Weight Bearing Restrictions No      Balance Screen   Has the patient fallen in the past 6 months Yes    How many times? 1    Has the patient had a decrease in activity level because of a fear of falling?  Yes    Is the patient reluctant to leave their home because of a fear of falling?  Yes      [place] Private residence    Living Arrangements Alone   CNA during the day    Available Help at Discharge Personal care  attendant;Available PRN/intermittently;Home health    Type of [place] One level  [place] - single point;Grab bars - tub/shower;Grab bars - toilet;Walker - 2 wheels      Prior Function   Level of Independence Requires assistive device for independence;Needs assistance with homemaking    Vocation Retired      Associate Professor   Overall Cognitive Status History of cognitive impairments - at baseline      Observation/Other Assessments   Focus on Therapeutic Outcomes (FOTO)  42      Standardized Balance Assessment   Standardized Balance Assessment Berg Balance Test      Berg Balance Test   Sit to Stand Able to stand  independently using hands    Standing Unsupported Able to stand 2 minutes with supervision    Sitting with Back Unsupported but Feet Supported on Floor or Stool Able to sit safely and securely 2 minutes    Stand to Sit Controls descent by using hands    Transfers Able to transfer safely, definite need of hands    Standing Unsupported with Eyes Closed Able to stand 10 seconds with supervision    Standing Unsupported with Feet Together Able to place feet together independently but unable to hold for 30 seconds    From Standing, Reach Forward with Outstretched Arm Can reach forward >5 cm safely (2")    From Standing Position, Pick up Object from Floor Able to pick up shoe, needs supervision    From Standing Position, Turn to Look Behind Over each Shoulder Looks behind one side only/other side shows less weight shift    Turn 360 Degrees Able to turn 360 degrees safely but slowly    Standing Unsupported, Alternately Place Feet on Step/Stool Able to complete >2 steps/needs minimal assist    Standing Unsupported, One Foot in Front Able to take small step independently and hold 30 seconds    Standing on One Leg Unable to try or needs assist to prevent fall    Total Score 34         Patient is returning to PT for evaluation  of balance. Patient has been seen by this therapist in the past. PMH AAA, arthritis, basal cell carcinoma of eyelid, benign neoplasm of ascending/descending colon, bilateral iliac artery stenosis, colon polyps, compression fx of 4th lumbar vertebra (s/p kyphoplasty T12, L3-5), COPD, CAD, dementia, falls,  GERD, hemorroids, HOH, HTN, Lewy Body Dementia, macular degeneration disease, osteoporosis, PAF, prostate cancer, prostate disorder, sciatic pain, sleep apnea, temporary cerebral vascular dysfunction. Comes with aide, having multiple near falls per day per aide, mainly to the sides.       PAIN: Reports no pain   POSTURE: Seated: forward head rounded shoulders slight trunk kyphosis Standing; heels together feet externally rotated, slight trunk flexion and rounded shoulders.   PROM/AROM:  AROM BLE:  Bilateral LE hip flexion limitations    STRENGTH:  Graded on a 0-5 scale Muscle Group Left Right  Hip Flex 3+/5 4-/5  Hip Abd 4-/5 4-/5  Hip Add 4-/5 4-/5  Hip Ext 3+/5 3+/5  Hip IR/ER 3+/5 3+/5  Knee Flex 3+/5 3+/5  Knee Ext 4-/5 4-/5  Ankle DF 3+/5 3+/5  Ankle PF 3+/5 3+ /5   SENSATION:   SOMATOSENSORY:  Any N & T in extremities or weakness: reports :         Sensation           Intact      Diminished         Absent  Light touch  BLE  COORDINATION: Finger to Nose: Dysmetric LUE, RUE WFL          Heel Shin Slide Test:Slow speed and moderate pass pointing ...   SPECIAL TESTS: Cranial Nerves/Vision : central vision loss.   FUNCTIONAL MOBILITY:  STS: occasionally <25% of the time able to perform without UE support,primarily utilizes arm rests to press self out of chair. Occasional posterior LOB  BALANCE: Static Sitting Balance  Normal Able to maintain balance against maximal resistance   Good Able to maintain balance against moderate resistance   Good-/Fair+ Accepts minimal resistance x  Fair Able to sit unsupported without balance loss and  without UE support   Poor+ Able to maintain with Minimal assistance from individual or chair   Poor Unable to maintain balance-requires mod/max support from individual or chair    Static Standing Balance  Normal Able to maintain standing balance against maximal resistance   Good Able to maintain standing balance against moderate resistance   Good-/Fair+ Able to maintain standing balance against minimal resistance   Fair Able to stand unsupported without UE support and without LOB for 1-2 min x  Fair- Requires Min A and UE support to maintain standing without loss of balance   Poor+ Requires mod A and UE support to maintain standing without loss of balance   Poor Requires max A and UE support to maintain standing balance without loss    Dynamic Sitting Balance  Normal Able to sit unsupported and weight shift across midline maximally   Good Able to sit unsupported and weight shift across midline moderately   Good-/Fair+ Able to sit unsupported and weight shift across midline minimally x  Fair Minimal weight shifting ipsilateral/front, difficulty crossing midline   Fair- Reach to ipsilateral side and unable to weight shift   Poor + Able to sit unsupported with min A and reach to ipsilateral side, unable to weight shift   Poor Able to sit unsupported with mod A and reach ipsilateral/front-cant cross midline    Standing Dynamic Balance  Normal Stand independently unsupported, able to weight shift and cross midline maximally   Good Stand independently unsupported, able to weight shift and cross midline moderately   Good-/Fair+ Stand independently unsupported, able to weight shift across midline minimally   Fair Stand independently unsupported, weight shift, and reach ipsilaterally, loss of balance when crossing midline   Poor+ Able to stand with Min A and reach ipsilaterally, unable to weight shift x  Poor Able to stand with Mod A and minimally reach ipsilaterally, unable to cross midline.       GAIT: Ambulates with a rollator outside of home but furniture surfs or walks with aide inside home.   Walking without AD: poor clearance of bilateral feet, poor heel/toe rocker with kick motion of LE's for knee extension and lateral hip drop.   OUTCOME MEASURES: TEST Outcome Interpretation  5 times sit<>stand 23.35 sec hands >60 yo, >15 sec indicates increased risk for falls  10 meter walk test     13.45 seconds=0.74            m/s <1.0 m/s indicates increased risk for falls; limited community ambulator          [employer] Assessment 34/56  <36/56 (100% risk for falls), 37-45 (80% risk for falls); 46-51 (>50% risk for falls); 52-55 (lower risk <25% of falls)  FOTO 42 Predicted d/c score of 51%            Objective measurements completed on examination: See above  findings.         Access Code: [email or web address] URL: [email or web address] Date: 07/12/2020 Prepared by: Janna Arch  Exercises Seated Long Arc Quad - 1 x daily - 7 x weekly - 2 sets - 10 reps - 5 hold Seated Hip Abduction with Resistance - 1 x daily - 7 x weekly - 2 sets - 10 reps - 5 hold Seated Heel Toe Raises - 1 x daily - 7 x weekly - 2 sets - 10 reps - 5 hold Standing March with Counter Support - 1 x daily - 7 x weekly - 2 sets - 10 reps - 5 hold Sit to Stand without Arm Support - 1 x daily - 7 x weekly - 2 sets - 10 reps - 5 hold        PT Education - 07/12/20 0957    Education provided Yes    Education Details goals, POC, HEP    Person(s) Educated Patient;Caregiver(s)    Methods Explanation;Demonstration;Tactile cues;Verbal cues;Handout    Comprehension Verbalized understanding;Returned demonstration;Verbal cues required;Tactile cues required            PT Short Term Goals - 07/12/20 0959      PT SHORT TERM GOAL #1   Title Patient will be independent in home exercise program to improve strength/mobility for better functional independence with ADLs.    Baseline 11/11: HEP  given    Time 4    Period Weeks    Status New    Target Date 08/09/20             PT Long Term Goals - 07/12/20 0959      PT LONG TERM GOAL #1   Title Patient will increase FOTO score to equal to or greater than 51% to demonstrate statistically significant improvement in mobility and quality of life.    Baseline 11/11: 42%    Time 12    Period Weeks    Status New    Target Date 10/04/20      PT LONG TERM GOAL #2   Title Patient will demonstrate an improved Berg Balance Score of > 40/56 as to demonstrate improved balance with ADLs such as sitting/standing and transfer balance and reduced fall risk.    Baseline 11/11: 34/56    Time 12    Period Weeks    Status New    Target Date 10/04/20      PT LONG TERM GOAL #3   Title Patient (> 57 years old) will complete five times sit to stand test in < 15 seconds without UE support indicating an increased LE strength and improved balance    Baseline 11/11; 23.35 seconds with UE support    Time 12    Period Weeks    Status New    Target Date 10/04/20      PT LONG TERM GOAL #4   Title Patient will increase 10 meter walk test to >1.74ms with LRAD  as to improve gait speed for better community ambulation and to reduce fall risk.    Baseline 11/11: 0.74 m/s without AD with one near LOB    Time 12    Period Weeks    Status New    Target Date 10/04/20      PT LONG TERM GOAL #5   Title Patient will increase BLE gross strength to 4+/5 as to improve functional strength for independent gait, increased standing tolerance and increased ADL ability.    Baseline 11/11: see note    Time  12    Period Weeks    Status New    Target Date 10/04/20                  Plan - 07/12/20 0958    Clinical Impression Statement Patient is a pleasant 86 year old male who presents to physical therapy for evaluation of balance. Patient has been seen by this therapist in the past for LSVT as well as other diagnoses in the past. Patient demonstrates  LE weakness and poor control of COM with frequent trunk sway and near LOB throughout evaluation. Patient will benefit from skilled physical therapy to increase strength, balance, and mobility to allow patient to reduce fall risk and increase independence in daily life.    Personal Factors and Comorbidities Age;Comorbidity 3+;Fitness;Past/Current Experience;Sex;Social Background;Time since onset of injury/illness/exacerbation;Transportation    Comorbidities AAA, arthritis, atherosclerotic heart disease, (s/p CABG), benign neoplasm of ascending/descending colon, COPD, CAD, dementia, dysrhythmia, frequent falls, GERD, hemorrhoid, HOH, HTN, PAF, prostate cancer.    Examination-Activity Limitations Bathing;Bend;Carry;Dressing;Continence;Stairs;Squat;Reach Overhead;Locomotion Level;Lift;Stand;Transfers;Toileting;Bed Mobility    Examination-Participation Restrictions Church;Cleaning;Community Activity;Driving;Interpersonal Relationship;Laundry;Volunteer;Shop;Personal Finances;Meal Prep;Yard Work    Merchant [employer] officer Evolving/Moderate complexity    Clinical Decision Making Moderate    Rehab Potential Fair    PT Frequency 2x / week    PT Duration 12 weeks    PT Treatment/Interventions ADLs/Self Care Home Management;Aquatic Therapy;Cryotherapy;Moist Heat;Traction;Ultrasound;Therapeutic activities;Functional mobility training;Stair training;Gait training;DME Instruction;Therapeutic exercise;Balance training;Neuromuscular re-education;Patient/family education;Manual techniques;Passive range of motion;Energy conservation;Vestibular;Taping;Biofeedback;Electrical Stimulation;Iontophoresis 1m/ml Dexamethasone;Dry needling;Visual/perceptual remediation/compensation;Cognitive remediation;Canalith Repostioning    PT Next Visit Plan balance, strength    PT Home Exercise Plan see above    Consulted and Agree with Plan of Care Patient;Family member/caregiver    Family Member Consulted aide            Patient will benefit from skilled therapeutic intervention in order to improve the following deficits and impairments:  Abnormal gait, Cardiopulmonary status limiting activity, Decreased activity tolerance, Decreased balance, Decreased knowledge of precautions, Decreased endurance, Decreased coordination, Decreased cognition, Decreased knowledge of use of DME, Decreased mobility, Difficulty walking, Decreased safety awareness, Decreased strength, Impaired flexibility, Impaired perceived functional ability, Impaired sensation, Postural dysfunction, Improper body mechanics, Pain, Impaired vision/preception, Decreased range of motion  Visit Diagnosis: Unsteadiness on feet  Muscle weakness (generalized)  Other abnormalities of gait and mobility     Problem List Patient Active Problem List   Diagnosis Date Noted   AAA (abdominal aortic aneurysm) without rupture ([place] 06/29/2020   LBD (Lewy body dementia) ([place] 05/14/2020   Numbness and tingling in left hand 03/02/2020   Hematuria    Acute kidney injury superimposed on CKD ([place]    Sepsis due to gram-negative UTI ([place] 12/26/2019   Left nephrolithiasis 12/26/2019   Hydronephrosis, left 12/26/2019   Status post abdominal aortic aneurysm (AAA) repair 04/15/2019   Malignant neoplasm of descending colon ([place]    Do not intubate, cardiopulmonary resuscitation (CPR)-only code status 10/08/2018   Prostate cancer ([place] 02/25/2018   Prostatic cyst 01/15/2018   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 ([place] 09/15/2017   Blood in stool    Abnormal feces    Stricture and stenosis of esophagus    Mild cognitive impairment 08/18/2017   Senile purpura ([place] 04/29/2017   Anemia 04/29/2017   Frequent falls 04/29/2017   Simple chronic bronchitis ([place] 12/17/2016   Benign localized hyperplasia of prostate with urinary obstruction 07/15/2016   Elevated PSA 07/15/2016   Incomplete emptying of bladder 07/15/2016   Urge incontinence  07/15/2016   Hypothyroidism  04/25/2016   Macular degeneration 04/25/2016   Erectile dysfunction due to arterial insufficiency    Ischemic heart disease with chronotropic incompetence    CN (constipation) 02/10/2015   DD (diverticular disease) 02/10/2015   Weakness 19/16/6060   Lichen planus 04/59/9774   Restless leg 02/10/2015   Circadian rhythm disorder 02/10/2015   B12 deficiency 02/10/2015   COPD (chronic obstructive pulmonary disease) ([place]) 11/14/2014   Post Inflammatory Lung Changes 11/14/2014   Acute delirium 11/08/2014   PAF (paroxysmal atrial fibrillation) (HCC) CHA2DS2-VASc = 4. AC = Eliquis    Insomnia 12/09/2013   OSA on CPAP    Dyspnea on exertion - -essentially resolved with BB dose reduction & wgt loss 03/29/2013   Overweight (BMI 25.0-29.9) -- Wgt back up 01/17/2013   Atherosclerotic heart disease of native coronary artery without angina pectoris - s/p CABG, occluded SVG-D1; patent LIMA-LAD, SVG-OM, SVG- RCA    Essential hypertension    Hyperlipidemia with target LDL less than 70    Aorto-iliac disease ([place])    BCC (basal cell carcinoma), eyelid 01/03/2013   Allergic rhinitis 08/17/2009   Adaptation reaction 05/28/2009   Acid reflux 05/28/2009   Arthritis, degenerative 05/28/2009   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

## 2020-07-13 ENCOUNTER — Ambulatory Visit: Payer: Self-pay | Admitting: Family Medicine

## 2020-07-17 ENCOUNTER — Encounter: Payer: Self-pay | Admitting: Family Medicine

## 2020-07-17 ENCOUNTER — Other Ambulatory Visit: Payer: Self-pay

## 2020-07-17 ENCOUNTER — Ambulatory Visit: Payer: Medicare Other

## 2020-07-17 ENCOUNTER — Ambulatory Visit (INDEPENDENT_AMBULATORY_CARE_PROVIDER_SITE_OTHER): Payer: Medicare Other | Admitting: Family Medicine

## 2020-07-17 VITALS — BP 125/76 | HR 68 | Temp 98.3°F | Wt 177.4 lb

## 2020-07-17 DIAGNOSIS — G3183 Dementia with Lewy bodies: Secondary | ICD-10-CM

## 2020-07-17 DIAGNOSIS — C61 Malignant neoplasm of prostate: Secondary | ICD-10-CM | POA: Diagnosis not present

## 2020-07-17 DIAGNOSIS — D519 Vitamin B12 deficiency anemia, unspecified: Secondary | ICD-10-CM | POA: Diagnosis not present

## 2020-07-17 DIAGNOSIS — Z23 Encounter for immunization: Secondary | ICD-10-CM | POA: Diagnosis not present

## 2020-07-17 DIAGNOSIS — I25708 Atherosclerosis of coronary artery bypass graft(s), unspecified, with other forms of angina pectoris: Secondary | ICD-10-CM

## 2020-07-17 DIAGNOSIS — R2689 Other abnormalities of gait and mobility: Secondary | ICD-10-CM

## 2020-07-17 DIAGNOSIS — R2681 Unsteadiness on feet: Secondary | ICD-10-CM | POA: Diagnosis not present

## 2020-07-17 DIAGNOSIS — M6281 Muscle weakness (generalized): Secondary | ICD-10-CM

## 2020-07-17 DIAGNOSIS — I10 Essential (primary) hypertension: Secondary | ICD-10-CM | POA: Diagnosis not present

## 2020-07-17 DIAGNOSIS — F0280 Dementia in other diseases classified elsewhere without behavioral disturbance: Secondary | ICD-10-CM

## 2020-07-17 NOTE — Progress Notes (Signed)
Established patient visit   Patient: [name]   DOB: 08-19-1934   86 y.o. Male  MRN: [id number] Visit Date: 07/17/2020  Today's healthcare provider: Lelon Huh, MD   Chief Complaint  Patient presents with  . Hypertension  . Altered Mental Status   Subjective    HPI  Hypertension, follow-up  BP Readings from Last 3 Encounters:  07/17/20 125/76  06/29/20 114/70  05/28/20 (!) 138/59   Wt Readings from Last 3 Encounters:  07/17/20 177 lb 6.4 oz (80.5 kg)  06/29/20 174 lb (78.9 kg)  05/28/20 178 lb 12.8 oz (81.1 kg)     He was last seen for hypertension 05/28/2020.  BP at that visit was 138/59. Management since that visit includes continue same medication.  He reports good compliance with treatment. He is not having side effects.  He is following a Regular diet. He is exercising. He does not smoke.  Use of agents associated with hypertension: none.   Outside blood pressures are checked. Recent systolic reading was 338.   Symptoms: No chest pain No chest pressure  No palpitations No syncope  No dyspnea No orthopnea  No paroxysmal nocturnal dyspnea No lower extremity edema   Pertinent labs: Lab Results  Component Value Date   CHOL 119 12/24/2019   HDL 39 (L) 12/24/2019   LDLCALC 62 12/24/2019   TRIG 90 12/24/2019   CHOLHDL 3.1 12/24/2019   Lab Results  Component Value Date   NA 140 05/14/2020   K 4.2 05/14/2020   CREATININE 0.98 05/14/2020   GFRNONAA 71 05/14/2020   GFRAA 82 05/14/2020   GLUCOSE 91 05/14/2020     The ASCVD Risk score (Goff DC Jr., et al., 2013) failed to calculate for the following reasons:   The 2013 ASCVD risk score is only valid for ages 15 to 67   ---------------------------------------------------------------------------------------------------  Follow up for confusion:  The patient was last seen for this 05/28/2020.  Changes made at last visit include reducing dose of melatonin. If that doesn't help they will  need to follow up with Dr. Manuella Ghazi.  He reports good compliance with treatment. Patient's son is unsure if melatonin dosage was decreased. He feels that condition is Unchanged. Had follow up with Dr. Manuella Ghazi yesterday for [facility] Body dementia.  He is not having side effects. Has not had any more falls since last visit.  -----------------------------------------------------------------------------------------      Medications: Outpatient Medications Prior to Visit  Medication Sig  . acetaminophen (TYLENOL) 500 MG tablet Take 500-1,000 mg by mouth every 6 (six) hours as needed for mild pain or moderate pain.   . Ascorbic Acid (VITAMIN C) 1000 MG tablet Take 1,000 mg by mouth daily.  . Cyanocobalamin (VITAMIN B-12) 500 MCG SUBL Place 500 mcg under the tongue daily.   [name] ELIQUIS 5 MG TABS tablet TAKE 1 TABLET BY MOUTH TWICE DAILY  . ezetimibe (ZETIA) 10 MG tablet TAKE 1 TABLET BY MOUTH DAILY  . finasteride (PROSCAR) 5 MG tablet TAKE ONE TABLET BY MOUTH EVERY DAY  . fluticasone (FLONASE) 50 MCG/ACT nasal spray PLACE TWO SPRAYS IN BOTH NOSRTILS EVERY DAY AS NEEDED FOR ALLERGIES  . Magnesium Oxide 500 MG TABS Take 500 mg by mouth every evening.  . melatonin 3 MG TABS tablet Take 0.5 tablets (1.5 mg total) by mouth at bedtime.  . metoprolol succinate (TOPROL-XL) 50 MG 24 hr tablet TAKE ONE TABLET BY MOUTH EVERY DAY TAKE WITH OR IMMEDIATELY FOLLOWING A MEAL  . Multiple  Vitamins-Minerals (PRESERVISION AREDS 2 PO) Take 1 tablet by mouth 2 (two) times daily.   [name] MYRBETRIQ 50 MG TB24 tablet TAKE ONE TABLET BY MOUTH EVERY DAY  . pantoprazole (PROTONIX) 40 MG tablet TAKE ONE TABLET BY MOUTH EVERY DAY  . QUEtiapine (SEROQUEL) 25 MG tablet Take 12.5 mg by mouth.   . rosuvastatin (CRESTOR) 20 MG tablet TAKE ONE TABLET BY MOUTH EVERY DAY  . umeclidinium-vilanterol (ANORO ELLIPTA) 62.5-25 MCG/INH AEPB Inhale 1 puff into the lungs daily.   [name] lisinopril (ZESTRIL) 2.5 MG tablet Take 1 tablet (2.5 mg total) by mouth  daily.  . nitroGLYCERIN (NITROSTAT) 0.4 MG SL tablet Place 1 tablet (0.4 mg total) under the tongue every 5 (five) minutes as needed for chest pain. (Patient not taking: Reported on 07/17/2020)   No facility-administered medications prior to visit.     Review of Systems  Constitutional: Negative for appetite change, chills and fever.  Respiratory: Negative for chest tightness, shortness of breath and wheezing.   Cardiovascular: Negative for chest pain and palpitations.  Gastrointestinal: Negative for abdominal pain, nausea and vomiting.      Objective    BP 125/76 (BP Location: Left Arm, Patient Position: Sitting, Cuff Size: Normal)   Pulse 68   Temp 98.3 F (36.8 C) (Oral)   Wt 177 lb 6.4 oz (80.5 kg)   BMI 25.45 kg/m    Physical Exam   General: Appearance:     Overweight male in no acute distress  Eyes:    PERRL, conjunctiva/corneas clear, EOM's intact       Lungs:     Clear to auscultation bilaterally, respirations unlabored  Heart:    Normal heart rate. Regular rhythm. No murmurs, rubs, or gallops.   MS:   All extremities are intact.   Neurologic:   Awake, alert, oriented x 3. No apparent focal neurological           defect.         Assessment & Plan     1. Anemia due to vitamin B12 deficiency, unspecified B12 deficiency type  - CBC  2. Essential hypertension Well controlled.  Continue current medications.    3. Lewy body dementia without behavioral disturbance (HCC) Stable, continue follow up with Dr. Manuella Ghazi.   4. 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 Compliant with medication.  Continue aggressive risk factor modification.  Continue routine follow up Dr. Ellyn Hack.   5. Prostate cancer ([place]) Due to check - PSA  6. Need for influenza vaccination  - Flu Vaccine QUAD High Dose(Fluad)   He is brought to the office by his son who frequently has to take off work to take him to his  medical appointments, and occasionally has to stay home from work to care for patient when other caregivers are not available, as patient is not safe to be alone. He may need FMLA forms to document need to be out of work 1-2 days in a week.      The entirety of the information documented in the History of Present Illness, Review of Systems and Physical Exam were personally obtained by me. Portions of this information were initially documented by the CMA and reviewed by me for thoroughness and accuracy.      Lelon Huh, MD  [facility] [phone] (phone) [phone] (fax)  [place]

## 2020-07-17 NOTE — Therapy (Signed)
[place] MAIN [facility] SERVICES [address] [place], [place], [address] Phone: [phone]   Fax:  [phone]  Physical Therapy Treatment  Patient Details  Name: [name] MRN: [id number] Date of Birth: Nov 02, 1933 Referring Provider (PT): Jennings Books K    Encounter Date: 07/17/2020   PT End of Session - 07/17/20 0931    Visit Number 2    Number of Visits 24    Date for PT Re-Evaluation 10/04/20    Authorization Type 2/10 eval 07/12/20    PT Start Time 0845    PT Stop Time 0923    PT Time Calculation (min) 38 min    Equipment Utilized During Treatment Gait belt    Activity Tolerance Patient tolerated treatment well;Patient limited by fatigue    Behavior During Therapy [facility] for tasks assessed/performed           Past Medical History:  Diagnosis Date   AAA (abdominal aortic aneurysm) ([place]) 05/2019   [place] Rapid progression from June-Aug 2020 (4.6 - 5.4 cm): s/p EVAR (Dr. Lucky Cowboy [facility]): EVAR - 23 mm prox, 12 cm distal & x 18 cm length Gore Excluder Endoprosthesis Main body (via RFA distal to lowest Renal A); 62mm x 14 cm L Contralateral Limb for L Iliac - extended with 8 mm x 6cm LifeStar stent (post-dilated with 7 mm DEB).  Additional R Iliac PTA not required.    Arthritis    Basal cell carcinoma of eyelid 01/03/2013   2019 - R side of Nose   Benign neoplasm of ascending colon    Benign neoplasm of descending colon    Bilateral iliac artery stenosis ([place]) 2007   Bilateral Iliac A stenting; extension of EVAR limbs into both Iliacs -- additional stent placed in L Iliac.   Cancer [facility])    Skin cancer   Colon polyps    Complication of anesthesia    severe confusion agitation requiring hospital admission x 4 days   Compression fracture of fourth lumbar vertebra ([place]) 04/2019   - s/p Kyphoplasty; T12, L3-5 (*[facility])   COPD (chronic obstructive pulmonary disease) (HCC)    Centrilobular Emphysema   Coronary artery  disease involving native coronary artery without angina pectoris 1998   - s/p CABG, occluded SVG-D1; patent LIMA-LAD, SVG-OM, SVG- RCA   Dementia ([place])    memory loss - started noting late 2019   Falls frequently    broke foot   Fracture 2021   left foot   GERD (gastroesophageal reflux disease)    Hemorrhoid 02/10/2015   History of hiatal hernia    History of kidney stones    HOH (hard of hearing)    Hypertension    Lewy body dementia ([place]) 04/04/2020   Macular degeneration disease    Osteoporosis    PAF (paroxysmal atrial fibrillation) (HCC)    on Eliquis for OAC - Rate Control    Prostate cancer (HCC)    Prostate disorder    Sciatic pain    Chronic   Sleep apnea    cpap   Temporary cerebral vascular dysfunction 02/10/2015   Had negative work-up.  July, 2012.  No medication changes, had forgot them that day, got dehydrated.    Urinary incontinence     Past Surgical History:  Procedure Laterality Date   ABDOMINAL AORTIC ENDOVASCULAR STENT GRAFT Bilateral 05/18/2019   Procedure: ABDOMINAL AORTIC ENDOVASCULAR STENT GRAFT;  Surgeon: Algernon Huxley, MD;  Location: [facility] ORS;  Service: Vascular;  Laterality: Bilateral;  ANKLE SURGERY Left    ORIF    Arch aortogram and carotid aortogram  06/02/2006   Dr Oneida Alar did surgery   BASAL CELL CARCINOMA EXCISION  04/2016   Dermatology   CARDIAC CATHETERIZATION  01/30/1998    (Dr. Linard Millers): Native LAD & mRCA CTO. 90% D1. Mod Cx. SVG-D1 CTO. SVG-RCA & SVG-OM along with LIMA-LAD patent;  LV NORMAL Fxn   CAROTID ENDARTERECTOMY Left Oct. 29, 2007   Dr. Oneida Alar:   CATARACT EXTRACTION W/PHACO Right 03/05/2017   Procedure: CATARACT EXTRACTION PHACO AND INTRAOCULAR LENS PLACEMENT ([place]);  Surgeon: Leandrew Koyanagi, MD;  Location: [facility] ORS;  Service: Ophthalmology;  Laterality: Right;  [place] 00:58.5 AP% 10.6 CDE 6.20 Fluid Pack lot # [id number]   CATARACT EXTRACTION W/PHACO Left 04/15/2017   Procedure: CATARACT EXTRACTION  PHACO AND INTRAOCULAR LENS PLACEMENT ([place])  Left;  Surgeon: Leandrew Koyanagi, MD;  Location: [place];  Service: Ophthalmology;  Laterality: Left;   COLONOSCOPY WITH PROPOFOL N/A 09/15/2017   Procedure: COLONOSCOPY WITH PROPOFOL;  Surgeon: Lucilla Lame, MD;  Location: [facility] ENDOSCOPY;  Service: Endoscopy;  Laterality: N/A;   COLONOSCOPY WITH PROPOFOL N/A 10/26/2018   Procedure: COLONOSCOPY WITH PROPOFOL;  Surgeon: Lucilla Lame, MD;  Location: [facility] ENDOSCOPY;  Service: Endoscopy;  Laterality: N/A;   CORONARY ARTERY BYPASS GRAFT  1998   LIMA-LAD, SVG-OM, SVG-RPDA, SVG-DIAG   CPET / MET  12/2012   Mild chronotropic incompetence - read 82% of predicted; also reduced effort; peak VO2 15.7 / 75% (did not reach Max effort) -- suggested ischemic response in last 1.5 minutes of exercise. Normal pulmonary function on PFTs but poor response to   CYSTOSCOPY W/ RETROGRADES Left 01/13/2020   Procedure: CYSTOSCOPY WITH RETROGRADE PYELOGRAM;  Surgeon: Billey Co, MD;  Location: [facility] ORS;  Service: Urology;  Laterality: Left;   CYSTOSCOPY WITH STENT PLACEMENT Left 12/26/2019   Procedure: CYSTOSCOPY WITH STENT PLACEMENT;  Surgeon: Billey Co, MD;  Location: [facility] ORS;  Service: Urology;  Laterality: Left;   CYSTOSCOPY/URETEROSCOPY/HOLMIUM LASER/STENT PLACEMENT Left 01/13/2020   Procedure: CYSTOSCOPY/URETEROSCOPY/HOLMIUM LASER/STENT EXCHANGE;  Surgeon: Billey Co, MD;  Location: [facility] ORS;  Service: Urology;  Laterality: Left;   ENDOVASCULAR REPAIR/STENT GRAFT N/A 05/18/2019   Procedure: ENDOVASCULAR REPAIR/STENT GRAFT;  Surgeon: Algernon Huxley, MD;  Location: [facility] INVASIVE CV LAB;; EVAR - 23 mm prox, 12 cm distal & x 18 cm length Gore Excluder Endoprosthesis Main body (via RFA distal to lowest Renal A); 26m x 14 cm L Contralateral Limb for L Iliac - extended with 8 mm x 6cm LifeStar stent; no additional R Iliac PTA needed.    ESOPHAGOGASTRODUODENOSCOPY (EGD) WITH PROPOFOL N/A 09/15/2017    Procedure: ESOPHAGOGASTRODUODENOSCOPY (EGD) WITH PROPOFOL;  Surgeon: WLucilla Lame MD;  [facility];  Service: Endoscopy;  Laterality: N/A;   FRACTURE SURGERY Right 03/19/2015   wrist   FRACTURE SURGERY Left    HIATAL HERNIA REPAIR     ILIAC ARTERY STENT  12/15/2005   PTA and direct stenting rgt and lft common iliac arteries   KYPHOPLASTY N/A 04/05/2019   Procedure: KYPHOPLASTY T12, L3, L4 L5;  Surgeon: MHessie Knows MD;  Location: [facility] ORS;  Service: Orthopedics;  Laterality: N/A;   NM GATED MYOVIEW ([place])  05/'19; 8/'20   a) [facility]: Low Risk - no ischemia or infarct.  EF > 65%. no RWMA;; b) ([facility] [place]: Normal wall motion.  No ischemia or infarction.   OPEN REDUCTION INTERNAL FIXATION (ORIF) DISTAL RADIAL FRACTURE Left 01/13/2019   Procedure:  OPEN REDUCTION INTERNAL FIXATION (ORIF) DISTAL  RADIAL FRACTURE - LEFT - SLEEP APNEA;  Surgeon: Hessie Knows, MD;  Location: [facility] ORS;  Service: Orthopedics;  Laterality: Left;   ORIF ELBOW FRACTURE Right 01/01/2017   Procedure: OPEN REDUCTION INTERNAL FIXATION (ORIF) ELBOW/OLECRANON FRACTURE;  Surgeon: Hessie Knows, MD;  Location: [facility] ORS;  Service: Orthopedics;  Laterality: Right;   ORIF WRIST FRACTURE Right 03/19/2015   Procedure: OPEN REDUCTION INTERNAL FIXATION (ORIF) WRIST FRACTURE;  Surgeon: Hessie Knows, MD;  Location: [facility] ORS;  Service: Orthopedics;  Laterality: Right;   SHOULDER ARTHROSCOPY Right    THORACIC AORTA - CAROTID ANGIOGRAM  October 2007   Dr. 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 Dilated left atrium.   TRANSTHORACIC ECHOCARDIOGRAM  04/2019   [facility] April 20, 2019): EF 55-60 %.  Mild LVH.  Relatively normal valves.    There were no vitals filed for this visit.   Subjective Assessment - 07/17/20 0928    Subjective Patient  presents with aide. Will have to leave a little early for his doctor appointment. No falls or LOB, has been compliant with HEP. Over the weekend [place] [facility] [place] and walk around for an hour and a half with a few rest breaks.    Patient is accompained by: --   aide   Pertinent History Patient is returning to PT for evaluation of balance. Patient has been seen by this therapist in the past. PMH AAA, arthritis, basal cell carcinoma of eyelid, benign neoplasm of ascending/descending colon, bilateral iliac artery stenosis, colon polyps, compression fx of 4th lumbar vertebra (s/p kyphoplasty T12, L3-5), COPD, CAD, dementia, falls,  GERD, hemorroids, HOH, HTN, Lewy Body Dementia, macular degeneration disease, osteoporosis, PAF, prostate cancer, prostate disorder, sciatic pain, sleep apnea, temporary cerebral vascular dysfunction. Comes with aide, having multiple near falls per day per aide, mainly to the sides.    Limitations Walking;Standing;House hold activities;Other (comment);Lifting;Sitting    How long can you sit comfortably? n/a back pain no longer limiting    How long can you stand comfortably? 10-12 with walker    How long can you walk comfortably? walks 15 minutes with a walker    Patient Stated Goals walk without stumbling, be able to be steady enough to not have to have someone holding his gait belt.    Currently in Pain? No/denies               vitals at start of session 133/ 68 pulse 72   TherEx: CGA with cues for sequencing, body mechanics, and safety    Standing with # 4 ankle weight: CGA for stability -March with 2x4 between feet 4x length of // bars, mod cueing for sequencing and placement of foot due to tendency for narrow BOS -Side stepping 4x length of // bars with cues for foot clearance.      Seated with # 4 lb ankle weights  -Seated LAQ 10x each LE, cues for clearance        Neuro Re-ed: CGA with cues for sequencing, body mechanics, and safety      airex pad: modified tandem stance with 4" step 2x 60 seconds each foot placement    airex pad; cross body punches to aide wearing mitts x 15 crosses each side, one near LOB but patient is able to stabilize without assistance, only CGA  airex balance beam: lateral stepping 4x length of // bars with decreasing UE support from BUE support to SUE support    Pt educated throughout session about proper posture and technique with exercises. Improved exercise technique, movement at target joints, use of target muscles after min to mod verbal, visual, tactile cues   Vitals monitored throughout physical therapy session to ensure therapeutic range.                   PT Education - 07/17/20 0929    Education provided Yes    Education Details exercise technique, body mechanics, stabilization    Person(s) Educated Patient;Caregiver(s)    Methods Explanation;Demonstration;Tactile cues;Verbal cues    Comprehension Verbalized understanding;Returned demonstration;Verbal cues required;Tactile cues required            PT Short Term Goals - 07/12/20 0959      PT SHORT TERM GOAL #1   Title Patient will be independent in home exercise program to improve strength/mobility for better functional independence with ADLs.    Baseline 11/11: HEP given    Time 4    Period Weeks    Status New    Target Date 08/09/20             PT Long Term Goals - 07/12/20 0959      PT LONG TERM GOAL #1   Title Patient will increase FOTO score to equal to or greater than 51% to demonstrate statistically significant improvement in mobility and quality of life.    Baseline 11/11: 42%    Time 12    Period Weeks    Status New    Target Date 10/04/20      PT LONG TERM GOAL #2   Title Patient will demonstrate an improved Berg Balance Score of > 40/56 as to demonstrate improved balance with ADLs such as sitting/standing and transfer balance and reduced fall risk.    Baseline 11/11: 34/56    Time 12     Period Weeks    Status New    Target Date 10/04/20      PT LONG TERM GOAL #3   Title Patient (> 57 years old) will complete five times sit to stand test in < 15 seconds without UE support indicating an increased LE strength and improved balance    Baseline 11/11; 23.35 seconds with UE support    Time 12    Period Weeks    Status New    Target Date 10/04/20      PT LONG TERM GOAL #4   Title Patient will increase 10 meter walk test to >1.73ms with LRAD  as to improve gait speed for better community ambulation and to reduce fall risk.    Baseline 11/11: 0.74 m/s without AD with one near LOB    Time 12    Period Weeks    Status New    Target Date 10/04/20      PT LONG TERM GOAL #5   Title Patient will increase BLE gross strength to 4+/5 as to improve functional strength for independent gait, increased standing tolerance and increased ADL ability.    Baseline 11/11: see note    Time 12    Period Weeks    Status New    Target Date 10/04/20                 Plan - 07/17/20 03151   Clinical Impression Statement Patient presents with excellent motivation to physical therapy session. He  requires visual and verbal cueing for sequencing with frequent task orientation mid task due to cognition at this time. Focus on widening BOS for increased stabilization performed today with patient continuing to require cueing due to poor spatial awareness and tendency to narrow base.  Patient will benefit from skilled physical therapy to increase strength, balance, and mobility to allow patient to reduce fall risk and increase independence in daily life.    Personal Factors and Comorbidities Age;Comorbidity 3+;Fitness;Past/Current Experience;Sex;Social Background;Time since onset of injury/illness/exacerbation;Transportation    Comorbidities AAA, arthritis, atherosclerotic heart disease, (s/p CABG), benign neoplasm of ascending/descending colon, COPD, CAD, dementia, dysrhythmia, frequent falls, GERD,  hemorrhoid, HOH, HTN, PAF, prostate cancer.    Examination-Activity Limitations Bathing;Bend;Carry;Dressing;Continence;Stairs;Squat;Reach Overhead;Locomotion Level;Lift;Stand;Transfers;Toileting;Bed Mobility    Examination-Participation Restrictions Church;Cleaning;Community Activity;Driving;Interpersonal Relationship;Laundry;Volunteer;Shop;Personal Finances;Meal Prep;Yard Work    [employer] Evolving/Moderate complexity    Rehab Potential Fair    PT Frequency 2x / week    PT Duration 12 weeks    PT Treatment/Interventions ADLs/Self Care Home Management;Aquatic Therapy;Cryotherapy;Moist Heat;Traction;Ultrasound;Therapeutic activities;Functional mobility training;Stair training;Gait training;DME Instruction;Therapeutic exercise;Balance training;Neuromuscular re-education;Patient/family education;Manual techniques;Passive range of motion;Energy conservation;Vestibular;Taping;Biofeedback;Electrical Stimulation;Iontophoresis 4mg /ml Dexamethasone;Dry needling;Visual/perceptual remediation/compensation;Cognitive remediation;Canalith Repostioning    PT Next Visit Plan balance, strength    PT Home Exercise Plan see above    Consulted and Agree with Plan of Care Patient;Family member/caregiver    Family Member Consulted aide           Patient will benefit from skilled therapeutic intervention in order to improve the following deficits and impairments:  Abnormal gait, Cardiopulmonary status limiting activity, Decreased activity tolerance, Decreased balance, Decreased knowledge of precautions, Decreased endurance, Decreased coordination, Decreased cognition, Decreased knowledge of use of DME, Decreased mobility, Difficulty walking, Decreased safety awareness, Decreased strength, Impaired flexibility, Impaired perceived functional ability, Impaired sensation, Postural dysfunction, Improper body mechanics, Pain, Impaired vision/preception, Decreased range of motion  Visit  Diagnosis: Unsteadiness on feet  Muscle weakness (generalized)  Other abnormalities of gait and mobility     Problem List Patient Active Problem List   Diagnosis Date Noted   AAA (abdominal aortic aneurysm) without rupture ([place]) 06/29/2020   LBD (Lewy body dementia) ([place]) 05/14/2020   Numbness and tingling in left hand 03/02/2020   Hematuria    Acute kidney injury superimposed on CKD ([place])    Sepsis due to gram-negative UTI ([place]) 12/26/2019   Left nephrolithiasis 12/26/2019   Hydronephrosis, left 12/26/2019   Status post abdominal aortic aneurysm (AAA) repair 04/15/2019   Malignant neoplasm of descending colon ([place])    Do not intubate, cardiopulmonary resuscitation (CPR)-only code status 10/08/2018   Prostate cancer ([place]) 02/25/2018   Prostatic cyst 01/15/2018   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 ([place]) 09/15/2017   Blood in stool    Abnormal feces    Stricture and stenosis of esophagus    Mild cognitive impairment 08/18/2017   Senile purpura ([place]) 04/29/2017   Anemia 04/29/2017   Frequent falls 04/29/2017   Simple chronic bronchitis ([place]) 12/17/2016   Benign localized hyperplasia of prostate with urinary obstruction 07/15/2016   Elevated PSA 07/15/2016   Incomplete emptying of bladder 07/15/2016   Urge incontinence 07/15/2016   Hypothyroidism 04/25/2016   Macular degeneration 04/25/2016   Erectile dysfunction due to arterial insufficiency    Ischemic heart disease with chronotropic incompetence    CN (constipation) 02/10/2015   DD (diverticular disease) 02/10/2015   Weakness 47/42/5956   Lichen planus 38/75/6433   Restless leg 02/10/2015   Circadian rhythm disorder 02/10/2015   B12 deficiency 02/10/2015  COPD (chronic obstructive pulmonary disease) ([place]) 11/14/2014   Post Inflammatory Lung Changes 11/14/2014   Acute delirium 11/08/2014   PAF (paroxysmal atrial fibrillation) (HCC) CHA2DS2-VASc = 4. AC = Eliquis    Insomnia  12/09/2013   OSA on CPAP    Dyspnea on exertion - -essentially resolved with BB dose reduction & wgt loss 03/29/2013   Overweight (BMI 25.0-29.9) -- Wgt back up 01/17/2013   Atherosclerotic heart disease of native coronary artery without angina pectoris - s/p CABG, occluded SVG-D1; patent LIMA-LAD, SVG-OM, SVG- RCA    Essential hypertension    Hyperlipidemia with target LDL less than 70    Aorto-iliac disease ([place])    BCC (basal cell carcinoma), eyelid 01/03/2013   Allergic rhinitis 08/17/2009   Adaptation reaction 05/28/2009   Acid reflux 05/28/2009   Arthritis, degenerative 05/28/2009   Abnormality of aortic arch branch 06/01/2006   Carotid artery occlusion without infarction 06/01/2006   PAD (peripheral artery disease) - bilateral common iliac stents 12/15/2005   Atherosclerotic heart disease of artery bypass graft 09/01/1997   Janna Arch, PT, DPT   07/17/2020, 9:41 AM  [place] [address] [place], [place], [address] Phone: [phone]   Fax:  [phone]  Name: [name] MRN: [id number] Date of Birth: [birth year]

## 2020-07-18 ENCOUNTER — Ambulatory Visit (INDEPENDENT_AMBULATORY_CARE_PROVIDER_SITE_OTHER): Payer: Medicare Other | Admitting: Nurse Practitioner

## 2020-07-18 ENCOUNTER — Ambulatory Visit (INDEPENDENT_AMBULATORY_CARE_PROVIDER_SITE_OTHER): Payer: Medicare Other

## 2020-07-18 ENCOUNTER — Encounter (INDEPENDENT_AMBULATORY_CARE_PROVIDER_SITE_OTHER): Payer: Self-pay | Admitting: Nurse Practitioner

## 2020-07-18 ENCOUNTER — Other Ambulatory Visit: Payer: Self-pay

## 2020-07-18 VITALS — BP 148/75 | HR 73 | Resp 16 | Wt 174.8 lb

## 2020-07-18 DIAGNOSIS — I739 Peripheral vascular disease, unspecified: Secondary | ICD-10-CM

## 2020-07-18 DIAGNOSIS — I10 Essential (primary) hypertension: Secondary | ICD-10-CM

## 2020-07-18 DIAGNOSIS — I6523 Occlusion and stenosis of bilateral carotid arteries: Secondary | ICD-10-CM

## 2020-07-18 LAB — CBC
Hematocrit: 36.3 % — ABNORMAL LOW (ref 37.5–51.0)
Hemoglobin: 12 g/dL — ABNORMAL LOW (ref 13.0–17.7)
MCH: 30.2 pg (ref 26.6–33.0)
MCHC: 33.1 g/dL (ref 31.5–35.7)
MCV: 91 fL (ref 79–97)
Platelets: 138 10*3/uL — ABNORMAL LOW (ref 150–450)
RBC: 3.98 x10E6/uL — ABNORMAL LOW (ref 4.14–5.80)
RDW: 13.5 % (ref 11.6–15.4)
WBC: 7.8 10*3/uL (ref 3.4–10.8)

## 2020-07-18 LAB — PSA: Prostate Specific Ag, Serum: <0.1 ng/mL (ref 0.0–4.0)

## 2020-07-19 ENCOUNTER — Ambulatory Visit: Payer: Medicare Other

## 2020-07-19 DIAGNOSIS — R2681 Unsteadiness on feet: Secondary | ICD-10-CM

## 2020-07-19 DIAGNOSIS — M6281 Muscle weakness (generalized): Secondary | ICD-10-CM

## 2020-07-19 DIAGNOSIS — R2689 Other abnormalities of gait and mobility: Secondary | ICD-10-CM

## 2020-07-19 NOTE — Therapy (Signed)
[place] MAIN [facility] SERVICES [address] [place], [place], [address] Phone: [phone]   Fax:  [phone]  Physical Therapy Treatment  Patient Details  Name: [name] MRN: [id number] Date of Birth: 1933/09/13 Referring Provider (PT): Jennings Books K    Encounter Date: 07/19/2020   PT End of Session - 07/19/20 0851    Visit Number 3    Number of Visits 24    Date for PT Re-Evaluation 10/04/20    Authorization Type 3/10 eval 07/12/20    PT Start Time 0845    PT Stop Time 0930    PT Time Calculation (min) 45 min    Equipment Utilized During Treatment Gait belt    Activity Tolerance Patient tolerated treatment well;Patient limited by fatigue    Behavior During Therapy [facility] for tasks assessed/performed           Past Medical History:  Diagnosis Date  . AAA (abdominal aortic aneurysm) ([place]) 05/2019   Relativley Rapid progression from June-Aug 2020 (4.6 - 5.4 cm): s/p EVAR (Dr. Lucky Cowboy [facility]): EVAR - 23 mm prox, 12 cm distal & x 18 cm length Gore Excluder Endoprosthesis Main body (via RFA distal to lowest Renal A); 28m x 14 cm L Contralateral Limb for L Iliac - extended with 8 mm x 6cm LifeStar stent (post-dilated with 7 mm DEB).  Additional R Iliac PTA not required.   . 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 ([place] 09/15/2017   09-15-2017 DIAGNOSIS:  A. COLON POLYP X 2, ASCENDING; COLD SNARE:  - TUBULAR ADENOMA.  - NEGATIVE FOR HIGH-GRADE DYSPLASIA AND MALIGNANCY.  - NO ADDITIONAL TISSUE IDENTIFIED; FECAL MATERIAL PRESENT.   B. COLON POLYP X 2, DESCENDING; COLD SNARE:  - ADENOCARCINOMA ARISING IN A DYSPLASTIC SESSILE SERRATED ADENOMA, WITH  LYMPHOVASCULAR INVASION, SEE COMMENT.  - TUBULAR ADENOMA, 2 FRAGMENTS, NEGATIVE FO  . Arthritis   . Basal cell carcinoma of eyelid 01/03/2013   2019 - R side of Nose  . Benign neoplasm of ascending colon   . Benign neoplasm of descending colon   . Bilateral iliac artery stenosis ([place] 2007   Bilateral  Iliac A stenting; extension of EVAR limbs into both Iliacs -- additional stent placed in L Iliac.  . Cancer ([place]    Skin cancer  . Colon polyps   . Complication of anesthesia    severe confusion agitation requiring hospital admission x 4 days  . Compression fracture of fourth lumbar vertebra ([place] 04/2019   - s/p Kyphoplasty; T12, L3-5 ([facility]  . COPD (chronic obstructive pulmonary disease) (HCC)    Centrilobular Emphysema  . Coronary artery disease involving native coronary artery without angina pectoris 1998   - s/p CABG, occluded SVG-D1; patent LIMA-LAD, SVG-OM, SVG- RCA  . Dementia ([place]    memory loss - started noting late 2019  . Falls frequently    broke foot  . Fracture 2021   left foot  . GERD (gastroesophageal reflux disease)   . Hemorrhoid 02/10/2015  . History of hiatal hernia   . History of kidney stones   . HOH (hard of hearing)   . Hypertension   . Lewy body dementia ([place] 04/04/2020  . Macular degeneration disease   . Osteoporosis   . PAF (paroxysmal atrial fibrillation) ([place]    on Eliquis for OAC - Rate Control   . Prostate cancer ([place]   . Prostate disorder   . Sciatic pain    Chronic  . Sleep apnea  cpap  . Temporary cerebral vascular dysfunction 02/10/2015   Had negative work-up.  July, 2012.  No medication changes, had forgot them that day, got dehydrated.   . Urinary incontinence     Past Surgical History:  Procedure Laterality Date  . ABDOMINAL AORTIC ENDOVASCULAR STENT GRAFT Bilateral 05/18/2019   Procedure: ABDOMINAL AORTIC ENDOVASCULAR STENT GRAFT;  Surgeon: Dew, Jason S, MD;  Location: [facility] ORS;  Service: Vascular;  Laterality: Bilateral;  . ANKLE SURGERY Left    ORIF   . Arch aortogram and carotid aortogram  06/02/2006   Dr Fields did surgery  . BASAL CELL CARCINOMA EXCISION  04/2016   Dermatology  . CARDIAC CATHETERIZATION  01/30/1998    (Dr. H. Smith): Native LAD & mRCA CTO. 90% D1. Mod Cx. SVG-D1 CTO. SVG-RCA & SVG-OM along with  LIMA-LAD patent;  LV NORMAL Fxn  . CAROTID ENDARTERECTOMY Left Oct. 29, 2007   Dr. Fields:  . CATARACT EXTRACTION W/PHACO Right 03/05/2017   Procedure: CATARACT EXTRACTION PHACO AND INTRAOCULAR LENS PLACEMENT (IOC);  Surgeon: Brasington, Chadwick, MD;  Location: [facility] ORS;  Service: Ophthalmology;  Laterality: Right;  US 00:58.5 AP% 10.6 CDE 6.20 Fluid Pack lot # [id number]  . CATARACT EXTRACTION W/PHACO Left 04/15/2017   Procedure: CATARACT EXTRACTION PHACO AND INTRAOCULAR LENS PLACEMENT (IOC)  Left;  Surgeon: Brasington, Chadwick, MD;  Location: [facility];  Service: Ophthalmology;  Laterality: Left;  . COLONOSCOPY WITH PROPOFOL N/A 09/15/2017   Procedure: COLONOSCOPY WITH PROPOFOL;  Surgeon: Wohl, Darren, MD;  Location: [facility] END[facility]COPY;  Service: Endoscopy;  Laterality: N/A;  . COLONOSCOPY WITH PROPOFOL N/A 10/26/2018   Procedure: COLONOSCOPY WITH PROPOFOL;  Surgeon: Wohl, Darren, MD;  Location: [facility];  Service: Endoscopy;  Laterality: N/A;  . CORONARY ARTERY BYPASS GRAFT  1998   LIMA-LAD, SVG-OM, SVG-RPDA, SVG-DIAG  . CPET / MET  12/2012   Mild chronotropic incompetence - read 82% of predicted; also reduced effort; peak VO2 15.7 / 75% (did not reach Max effort) -- suggested ischemic response in last 1.5 minutes of exercise. Normal pulmonary function on PFTs but poor response to  . CYSTOSCOPY W/ RETROGRADES Left 01/13/2020   Procedure: CYSTOSCOPY WITH RETROGRADE PYELOGRAM;  Surgeon: Sninsky, Brian C, MD;  Location: [facility] ORS;  Service: Urology;  Laterality: Left;  . CYSTOSCOPY WITH STENT PLACEMENT Left 12/26/2019   Procedure: CYSTOSCOPY WITH STENT PLACEMENT;  Surgeon: Sninsky, Brian C, MD;  Location: [facility]S;  Service: Urology;  Laterality: Left;  . CYSTOSCOPY/URETEROSCOPY/HOLMIUM LASER/STENT PLACEMENT Left 01/13/2020   Procedure: CYSTOSCOPY/URETEROSCOPY/HOLMIUM LASER/STENT EXCHANGE;  Surgeon: Sninsky, Brian C, MD;  Location: [facility]S;  Service: Urology;  Laterality: Left;  .  ENDOVASCULAR REPAIR/STENT GRAFT N/A 05/18/2019   Procedure: ENDOVASCULAR REPAIR/STENT GRAFT;  Surgeon: Dew, Jason S, MD;  Location: [facility];; EVAR - 23 mm prox, 12 cm distal & x 18 cm length Gore Excluder Endoprosthesis Main body (via RFA distal to lowest Renal A); 12mm x 14 cm L Contralateral Limb for L Iliac - extended with 8 mm x 6cm LifeStar stent; no additional R Iliac PTA needed.   . ESOPHAGOGASTRODUODENOSCOPY (EGD) WITH PROPOFOL N/A 09/15/2017   Procedure: ESOPHAGOGASTRODUODENOSCOPY (EGD) WITH PROPOFOL;  Surgeon: Wohl, Darren, MD;  Location: [facility];  Service: Endoscopy;  Laterality: N/A;  . FRACTURE SURGERY Right 03/19/2015   wrist  . FRACTURE SURGERY Left   . HIATAL HERNIA REPAIR    . ILIAC ARTERY STENT  12/15/2005   PTA and direct stenting rgt and lft common iliac arteries  . KYPHOPLASTY   N/A 04/05/2019   Procedure: KYPHOPLASTY T12, L3, L4 L5;  Surgeon: Hessie Knows, MD;  Location: [facility];  Service: Orthopedics;  Laterality: N/A;  . NM GATED MYOVIEW ([place] HX)  05/'19; 8/'20   a) [facility]: Low Risk - no ischemia or infarct.  EF > 65%. no RWMA;; b) [facility]): Normal wall motion.  No ischemia or infarction.  . OPEN REDUCTION INTERNAL FIXATION (ORIF) DISTAL RADIAL FRACTURE Left 01/13/2019   Procedure: OPEN REDUCTION INTERNAL FIXATION (ORIF) DISTAL  RADIAL FRACTURE - LEFT - SLEEP APNEA;  Surgeon: Hessie Knows, MD;  Location: [facility];  Service: Orthopedics;  Laterality: Left;  . ORIF ELBOW FRACTURE Right 01/01/2017   Procedure: OPEN REDUCTION INTERNAL FIXATION (ORIF) ELBOW/OLECRANON FRACTURE;  Surgeon: Hessie Knows, MD;  Location: [facility];  Service: Orthopedics;  Laterality: Right;  . ORIF WRIST FRACTURE Right 03/19/2015   Procedure: OPEN REDUCTION INTERNAL FIXATION (ORIF) WRIST FRACTURE;  Surgeon: Hessie Knows, MD;  Location: [facility] ORS;  Service: Orthopedics;  Laterality: Right;  . SHOULDER ARTHROSCOPY Right   . THORACIC AORTA - CAROTID ANGIOGRAM  October 2007   Dr.  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 TRANSTHORACIC ECHOCARDIOGRAM  February 2016   [facility]: Normal LV function. Dilated left atrium.  Domingo Dimes ECHOCARDIOGRAM  04/2019   [facility] April 20, 2019): EF 55-60 %.  Mild LVH.  Relatively normal valves.    There were no vitals filed for this visit.   Subjective Assessment - 07/19/20 0849    Subjective Patient reports having multiple doctor appointments since he was last seen. Reports no pain today or falls since last visit.    Patient is accompained by: --   aide   Pertinent History Patient is returning to PT for evaluation of balance. Patient has been seen by this therapist in the past. PMH AAA, arthritis, basal cell carcinoma of eyelid, benign neoplasm of ascending/descending colon, bilateral iliac artery stenosis, colon polyps, compression fx of 4th lumbar vertebra (s/p kyphoplasty T12, L3-5), COPD, CAD, dementia, falls,  GERD, hemorroids, HOH, HTN, Lewy Body Dementia, macular degeneration disease, osteoporosis, PAF, prostate cancer, prostate disorder, sciatic pain, sleep apnea, temporary cerebral vascular dysfunction. Comes with aide, having multiple near falls per day per aide, mainly to the sides.    Limitations Walking;Standing;House hold activities;Other (comment);Lifting;Sitting    How long can you sit comfortably? n/a back pain no longer limiting    How long can you stand comfortably? 10-12 with walker    How long can you walk comfortably? walks 15 minutes with a walker    Patient Stated Goals walk without stumbling, be able to be steady enough to not have to have someone holding his gait belt.    Currently in Pain? No/denies                 vitals at start of session 133/ 68 pulse 72  TherEx: CGA with cues for sequencing, body mechanics, and safety    Standing with # 4 ankle weight: CGA for stability -March with 2x4 between  feet 12x each LE mod cueing for sequencing and placement of foot due to tendency for narrow BOS -hip abduction 10x each LE cues for foot clearance.  -hip extension 10x each LE, cues for upright posture 10x each LE -8" step toe taps 12x each LE, BUE support     seated: GTB around ankles:  -alternating ER/IR 15x each LE -alternating LAQ 15x each LE  Neuro Re-ed: CGA with cues for sequencing, body mechanics, and safety     airex pad: modified tandem stance with different colored airex pads 2x 60 seconds each foot placement    airex pad; throw foot ball back and forth with aide and close CGA from PT, patient experienced multiple posterior LOB requiring cueing for finding Com x 10 minutes   Lateral step over and back orange hurdle 10x each LE  bosu ball, blue side up: modified forward lunges 15x each LE     Pt educated throughout session about proper posture and technique with exercises. Improved exercise technique, movement at target joints, use of target muscles after min to mod verbal, visual, tactile cues   Vitals monitored throughout physical therapy session to ensure therapeutic range. Frequent dropping of Sp02 to 86% in standing, return to >90% in seated, aide aware and will continue to monitor at home.                          PT Education - 07/19/20 0850    Education provided Yes    Education Details exercise technique, body mechanics, stability    Person(s) Educated Patient    Methods Explanation;Demonstration;Tactile cues;Verbal cues    Comprehension Verbalized understanding;Returned demonstration;Verbal cues required;Tactile cues required            PT Short Term Goals - 07/12/20 0959      PT SHORT TERM GOAL #1   Title Patient will be independent in home exercise program to improve strength/mobility for better functional independence with ADLs.    Baseline 11/11: HEP given    Time 4    Period Weeks    Status New    Target Date 08/09/20              PT Long Term Goals - 07/12/20 0959      PT LONG TERM GOAL #1   Title Patient will increase FOTO score to equal to or greater than 51% to demonstrate statistically significant improvement in mobility and quality of life.    Baseline 11/11: 42%    Time 12    Period Weeks    Status New    Target Date 10/04/20      PT LONG TERM GOAL #2   Title Patient will demonstrate an improved Berg Balance Score of > 40/56 as to demonstrate improved balance with ADLs such as sitting/standing and transfer balance and reduced fall risk.    Baseline 11/11: 34/56    Time 12    Period Weeks    Status New    Target Date 10/04/20      PT LONG TERM GOAL #3   Title Patient (> 27 years old) will complete five times sit to stand test in < 15 seconds without UE support indicating an increased LE strength and improved balance    Baseline 11/11; 23.35 seconds with UE support    Time 12    Period Weeks    Status New    Target Date 10/04/20      PT LONG TERM GOAL #4   Title Patient will increase 10 meter walk test to >1.66ms with LRAD  as to improve gait speed for better community ambulation and to reduce fall risk.    Baseline 11/11: 0.74 m/s without AD with one near LOB    Time 12    Period Weeks    Status New    Target Date 10/04/20  PT LONG TERM GOAL #5   Title Patient will increase BLE gross strength to 4+/5 as to improve functional strength for independent gait, increased standing tolerance and increased ADL ability.    Baseline 11/11: see note    Time 12    Period Weeks    Status New    Target Date 10/04/20                 Plan - 07/19/20 1323    Clinical Impression Statement Patient presents with increased fatigue this session due to sleeping poorly the night before. Sp02 required constant monitoring this session due to frequent dropping of Sp02 to 86% in standing, return to >90% in seated, aide aware and will continue to monitor at home. Patient will benefit from skilled  physical therapy to increase strength, balance, and mobility to allow patient to reduce fall risk and increase independence in daily life.    Personal Factors and Comorbidities Age;Comorbidity 3+;Fitness;Past/Current Experience;Sex;Social Background;Time since onset of injury/illness/exacerbation;Transportation    Comorbidities AAA, arthritis, atherosclerotic heart disease, (s/p CABG), benign neoplasm of ascending/descending colon, COPD, CAD, dementia, dysrhythmia, frequent falls, GERD, hemorrhoid, HOH, HTN, PAF, prostate cancer.    Examination-Activity Limitations Bathing;Bend;Carry;Dressing;Continence;Stairs;Squat;Reach Overhead;Locomotion Level;Lift;Stand;Transfers;Toileting;Bed Mobility    Examination-Participation Restrictions Church;Cleaning;Community Activity;Driving;Interpersonal Relationship;Laundry;Volunteer;Shop;Personal Finances;Meal Prep;Yard Work    [employer] Evolving/Moderate complexity    Rehab Potential Fair    PT Frequency 2x / week    PT Duration 12 weeks    PT Treatment/Interventions ADLs/Self Care Home Management;Aquatic Therapy;Cryotherapy;Moist Heat;Traction;Ultrasound;Therapeutic activities;Functional mobility training;Stair training;Gait training;DME Instruction;Therapeutic exercise;Balance training;Neuromuscular re-education;Patient/family education;Manual techniques;Passive range of motion;Energy conservation;Vestibular;Taping;Biofeedback;Electrical Stimulation;Iontophoresis 42m/ml Dexamethasone;Dry needling;Visual/perceptual remediation/compensation;Cognitive remediation;Canalith Repostioning    PT Next Visit Plan balance, strength    PT Home Exercise Plan see above    Consulted and Agree with Plan of Care Patient;Family member/caregiver    Family Member Consulted aide           Patient will benefit from skilled therapeutic intervention in order to improve the following deficits and impairments:  Abnormal gait, Cardiopulmonary status limiting  activity, Decreased activity tolerance, Decreased balance, Decreased knowledge of precautions, Decreased endurance, Decreased coordination, Decreased cognition, Decreased knowledge of use of DME, Decreased mobility, Difficulty walking, Decreased safety awareness, Decreased strength, Impaired flexibility, Impaired perceived functional ability, Impaired sensation, Postural dysfunction, Improper body mechanics, Pain, Impaired vision/preception, Decreased range of motion  Visit Diagnosis: Unsteadiness on feet  Muscle weakness (generalized)  Other abnormalities of gait and mobility     Problem List Patient Active Problem List   Diagnosis Date Noted  . AAA (abdominal aortic aneurysm) without rupture ([place] 06/29/2020  . LBD (Lewy body dementia) ([place] 05/14/2020  . Numbness and tingling in left hand 03/02/2020  . Hematuria   . Acute kidney injury superimposed on CKD ([place]   . Sepsis due to gram-negative UTI ([place] 12/26/2019  . Left nephrolithiasis 12/26/2019  . Hydronephrosis, left 12/26/2019  . Status post abdominal aortic aneurysm (AAA) repair 04/15/2019  . Do not intubate, cardiopulmonary resuscitation (CPR)-only code status 10/08/2018  . Prostate cancer ([place] 02/25/2018  . Prostatic cyst 01/15/2018  . Blood in stool   . Abnormal feces   . Stricture and stenosis of esophagus   . Mild cognitive impairment 08/18/2017  . Senile purpura ([place] 04/29/2017  . Anemia 04/29/2017  . Frequent falls 04/29/2017  . Simple chronic bronchitis ([place] 12/17/2016  . Benign localized hyperplasia of prostate with urinary obstruction 07/15/2016  . Elevated PSA 07/15/2016  . Incomplete emptying of bladder 07/15/2016  . Urge  incontinence 07/15/2016  . Hypothyroidism 04/25/2016  . Macular degeneration 04/25/2016  . Erectile dysfunction due to arterial insufficiency   . Ischemic heart disease with chronotropic incompetence   . CN (constipation) 02/10/2015  . DD (diverticular disease) 02/10/2015  .  Weakness 02/10/2015  . Lichen planus 79/43/2761  . Restless leg 02/10/2015  . 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 B12 deficiency 02/10/2015  . COPD (chronic obstructive pulmonary disease) ([place]) 11/14/2014  . Post Inflammatory Lung Changes 11/14/2014  . PAF (paroxysmal atrial fibrillation) (HCC) CHA2DS2-VASc = 4. AC = Eliquis   . Insomnia 12/09/2013  . OSA on CPAP   . Dyspnea on exertion - -essentially resolved with BB dose reduction & wgt loss 03/29/2013  . Overweight (BMI 25.0-29.9) -- Wgt back up 01/17/2013  . Atherosclerotic heart disease of native coronary artery without angina pectoris - s/p CABG, occluded SVG-D1; patent LIMA-LAD, SVG-OM, SVG- RCA   . Essential hypertension   . Hyperlipidemia with target LDL less than 70   . Aorto-iliac disease ([place])   . BCC (basal cell carcinoma), eyelid 01/03/2013  . Allergic rhinitis 08/17/2009  . Adaptation reaction 05/28/2009  . Acid reflux 05/28/2009  . Arthritis, degenerative 05/28/2009  . 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 Carotid artery occlusion without infarction 06/01/2006  . PAD (peripheral artery disease) - bilateral common iliac stents 12/15/2005  . Atherosclerotic heart disease of artery bypass graft 09/01/1997   Janna Arch, PT, DPT   07/19/2020, 1:23 PM  [place] MAIN [facility] SERVICES [address] [place], [place], [address] Phone: [phone]   Fax:  [phone]  Name: [name] MRN: [id number] Date of Birth: Feb 15, 1934

## 2020-07-22 ENCOUNTER — Encounter (INDEPENDENT_AMBULATORY_CARE_PROVIDER_SITE_OTHER): Payer: Self-pay | Admitting: Nurse Practitioner

## 2020-07-22 NOTE — Progress Notes (Signed)
Subjective:    Patient ID: [name], male    DOB: 05-02-1934, 86 y.o.   MRN: [id number] Chief Complaint  Patient presents with  . Follow-up    ultrasound follow up    The patient returns to the office for followup and review of the noninvasive studies. There have been no interval changes in lower extremity symptoms. No interval shortening of the patient's claudication distance or development of rest pain symptoms. No new ulcers or wounds have occurred since the last visit.  There have been no significant changes to the patient's overall health care.  The patient denies amaurosis fugax or recent TIA symptoms. There are no recent neurological changes noted. The patient denies history of DVT, PE or superficial thrombophlebitis. The patient denies recent episodes of angina or shortness of breath.   ABI Rt=1.14 and Lt=1.08  (previous ABI's Rt=1.08 and Lt=1.22) Duplex ultrasound of the bilateral tibial arteries reveals biphasic waveforms with strong toe waveforms bilaterally  The patient is seen for follow up evaluation of carotid stenosis. The carotid stenosis followed by ultrasound.    The patient is taking enteric-coated aspirin 81 mg daily.  There is no history of migraine headaches. There is no history of seizures.   Carotid Duplex done today shows 1 to 39% stenosis of the right ICA with no stenosis of the left ICA.  The previous endarterectomy of the left ICA is widely patent with no evidence of restenosis.  The bilateral vertebral arteries have antegrade flow with normal flow hemodynamics in the bilateral subclavian arteries.  No change compared to previous study.   Review of Systems  Neurological: Positive for weakness.  Psychiatric/Behavioral: Positive for confusion.  All other systems reviewed and are negative.      Objective:   Physical Exam Vitals reviewed.  HENT:     Head: Normocephalic.  Cardiovascular:     Rate and Rhythm: Normal rate.     Pulses:  Normal pulses.  Pulmonary:     Effort: Pulmonary effort is normal.  Neurological:     Mental Status: He is alert and oriented to person, place, and time.     Motor: Weakness present.  Psychiatric:        Mood and Affect: Mood normal.        Behavior: Behavior normal.        Thought Content: Thought content normal.        Cognition and Memory: Cognition is impaired. Memory is impaired.        Judgment: Judgment normal.     BP (!) 148/75 (BP Location: Right Arm)   Pulse 73   Resp 16   Wt 174 lb 12.8 oz (79.3 kg)   BMI 25.08 kg/m   Past Medical History:  Diagnosis Date  . AAA (abdominal aortic aneurysm) ([place]) 05/2019   Relativley Rapid progression from June-Aug 2020 (4.6 - 5.4 cm): s/p EVAR (Dr. [facility]): EVAR - 23 mm prox, 12 cm distal & x 18 cm length Gore Excluder Endoprosthesis Main body (via RFA distal to lowest Renal A); 68m x 14 cm L Contralateral Limb for L Iliac - extended with 8 mm x 6cm LifeStar stent (post-dilated with 7 mm DEB).  Additional R Iliac PTA not required.   . 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 ([place] 09/15/2017   09-15-2017 DIAGNOSIS:  A. COLON POLYP X 2, ASCENDING; COLD SNARE:  - TUBULAR ADENOMA.  - NEGATIVE FOR HIGH-GRADE DYSPLASIA AND MALIGNANCY.  - NO ADDITIONAL TISSUE IDENTIFIED; FECAL MATERIAL PRESENT.  B. COLON POLYP X 2, DESCENDING; COLD SNARE:  - ADENOCARCINOMA ARISING IN A DYSPLASTIC SESSILE SERRATED ADENOMA, WITH  LYMPHOVASCULAR INVASION, SEE COMMENT.  - TUBULAR ADENOMA, 2 FRAGMENTS, NEGATIVE FO  . Arthritis   . Basal cell carcinoma of eyelid 01/03/2013   2019 - R side of Nose  . Benign neoplasm of ascending colon   . Benign neoplasm of descending colon   . Bilateral iliac artery stenosis ([place]) 2007   Bilateral Iliac A stenting; extension of EVAR limbs into both Iliacs -- additional stent placed in L Iliac.  . Cancer ([place])    Skin cancer  . Colon polyps   . Complication of anesthesia    severe confusion agitation requiring hospital admission x 4  days  . Compression fracture of fourth lumbar vertebra ([place]) 04/2019   - s/p Kyphoplasty; T12, L3-5 [facility])  . COPD (chronic obstructive pulmonary disease) (H[facility])    Centrilobular Emphysema  . Coronary artery disease involving native coronary artery without angina pectoris 1998   - s/p CABG, occluded SVG-D1; patent LIMA-LAD, SVG-OM, SVG- RCA  . Dementia ([place])    memory loss - started noting late 2019  . Falls frequently    broke foot  . Fracture 2021   left foot  . GERD (gastroesophageal reflux disease)   . Hemorrhoid 02/10/2015  . History of hiatal hernia   . History of kidney stones   . HOH (hard of hearing)   . Hypertension   . Lewy body dementia ([place]) 04/04/2020  . Macular degeneration disease   . Osteoporosis   . PAF (paroxysmal atrial fibrillation) ([place])    on Eliquis for OAC - Rate Control   . Prostate cancer ([place])   . Prostate disorder   . Sciatic pain    Chronic  . Sleep apnea    cpap  . Temporary cerebral vascular dysfunction 02/10/2015   Had negative work-up.  July, 2012.  No medication changes, had forgot them that day, got dehydrated.   . Urinary incontinence     Social History   Socioeconomic History  . Marital status: Widowed    Spouse name: Not on file  . Number of children: Not on file  . Years of education: Not on file  . Highest education level: Not on file  Occupational History  . Not on file  Tobacco Use  . Smoking status: Former Smoker    Types: Cigarettes    Quit date: 08/04/1997    Years since quitting: 22.9  . Smokeless tobacco: Never Used  Vaping Use  . Vaping Use: Never used  Substance and Sexual Activity  . Alcohol use: No  . Drug use: No  . Sexual activity: Not Currently    Birth control/protection: None  Other Topics Concern  . Not on file  Social History Narrative   Resides at [facility] since 06/2018   He is a widowed father of one, grandfather of 76 with 3 stepchildren.    Now accompanied by his long-term significant other who  lives just a few minutes away. .    She accompanies him with just not any clinic visit or hospitalization.  She also helps make sure he is taking his medication.   He has become more debilitated since his EVAR last September and has had more gait instability with balance issues.   He is not nearly as active as he used to be.  Really got deconditioned and has taken a long time to recover.   Currently undergoing physical therapy, Occupational  Therapy treatments.      He has changed his CODE STATUS to DNR.   Social Determinants of Health   Financial Resource Strain:   . Difficulty of Paying Living Expenses: Not on file  Food Insecurity:   . Worried About Charity fundraiser in the Last Year: Not on file  . Ran Out of Food in the Last Year: Not on file  Transportation Needs:   . Lack of Transportation (Medical): Not on file  . Lack of Transportation (Non-Medical): Not on file  Physical Activity:   . Days of Exercise per Week: Not on file  . Minutes of Exercise per Session: Not on file  Stress:   . Feeling of Stress : Not on file  Social Connections:   . Frequency of Communication with Friends and Family: Not on file  . Frequency of Social Gatherings with Friends and Family: Not on file  . Attends Religious Services: Not on file  . Active Member of Clubs or Organizations: Not on file  . Attends Archivist Meetings: Not on file  . Marital Status: Not on file  Intimate Partner Violence:   . Fear of Current or Ex-Partner: Not on file  . Emotionally Abused: Not on file  . Physically Abused: Not on file  . Sexually Abused: Not on file    Past Surgical History:  Procedure Laterality Date  . ABDOMINAL AORTIC ENDOVASCULAR STENT GRAFT Bilateral 05/18/2019   Procedure: ABDOMINAL AORTIC ENDOVASCULAR STENT GRAFT;  Surgeon: Algernon Huxley, MD;  Location: [facility] ORS;  Service: Vascular;  Laterality: Bilateral;  . ANKLE SURGERY Left    ORIF   . Arch aortogram and carotid aortogram   06/02/2006   Dr Oneida Alar did surgery  . BASAL CELL CARCINOMA EXCISION  04/2016   Dermatology  . CARDIAC CATHETERIZATION  01/30/1998    (Dr. Linard Millers): Native LAD & mRCA CTO. 90% D1. Mod Cx. SVG-D1 CTO. SVG-RCA & SVG-OM along with LIMA-LAD patent;  LV NORMAL Fxn  . CAROTID ENDARTERECTOMY Left Oct. 29, 2007   Dr. Oneida Alar:  . CATARACT EXTRACTION W/PHACO Right 03/05/2017   Procedure: CATARACT EXTRACTION PHACO AND INTRAOCULAR LENS PLACEMENT (IOC);  Surgeon: Leandrew Koyanagi, MD;  Location: [facility] ORS;  Service: Ophthalmology;  Laterality: Right;  [place] 00:58.5 AP% 10.6 CDE 6.20 Fluid Pack lot # [id number]  . CATARACT EXTRACTION W/PHACO Left 04/15/2017   Procedure: CATARACT EXTRACTION PHACO AND INTRAOCULAR LENS PLACEMENT ([place])  Left;  Surgeon: Leandrew Koyanagi, MD;  Location: [place];  Service: Ophthalmology;  Laterality: Left;  . COLONOSCOPY WITH PROPOFOL N/A 09/15/2017   Procedure: COLONOSCOPY WITH PROPOFOL;  Surgeon: Lucilla Lame, MD;  Location: [facility] ENDOSCOPY;  Service: Endoscopy;  Laterality: N/A;  . COLONOSCOPY WITH PROPOFOL N/A 10/26/2018   Procedure: COLONOSCOPY WITH PROPOFOL;  Surgeon: Lucilla Lame, MD;  Location: [facility] ENDOSCOPY;  Service: Endoscopy;  Laterality: N/A;  . CORONARY ARTERY BYPASS GRAFT  1998   LIMA-LAD, SVG-OM, SVG-RPDA, SVG-DIAG  . CPET / MET  12/2012   Mild chronotropic incompetence - read 82% of predicted; also reduced effort; peak VO2 15.7 / 75% (did not reach Max effort) -- suggested ischemic response in last 1.5 minutes of exercise. Normal pulmonary function on PFTs but poor response to  . CYSTOSCOPY W/ RETROGRADES Left 01/13/2020   Procedure: CYSTOSCOPY WITH RETROGRADE PYELOGRAM;  Surgeon: Billey Co, MD;  Location: [facility] ORS;  Service: Urology;  Laterality: Left;  . CYSTOSCOPY WITH STENT PLACEMENT Left 12/26/2019   Procedure: CYSTOSCOPY WITH STENT PLACEMENT;  Surgeon: Billey Co, MD;  Location: [facility] ORS;  Service: Urology;  Laterality: Left;  .  CYSTOSCOPY/URETEROSCOPY/HOLMIUM LASER/STENT PLACEMENT Left 01/13/2020   Procedure: CYSTOSCOPY/URETEROSCOPY/HOLMIUM LASER/STENT EXCHANGE;  Surgeon: Billey Co, MD;  Location: [facility] ORS;  Service: Urology;  Laterality: Left;  . ENDOVASCULAR REPAIR/STENT GRAFT N/A 05/18/2019   Procedure: ENDOVASCULAR REPAIR/STENT GRAFT;  Surgeon: Algernon Huxley, MD;  Location: [facility] INVASIVE CV LAB;; EVAR - 23 mm prox, 12 cm distal & x 18 cm length Gore Excluder Endoprosthesis Main body (via RFA distal to lowest Renal A); 4m x 14 cm L Contralateral Limb for L Iliac - extended with 8 mm x 6cm LifeStar stent; no additional R Iliac PTA needed.   . ESOPHAGOGASTRODUODENOSCOPY (EGD) WITH PROPOFOL N/A 09/15/2017   Procedure: ESOPHAGOGASTRODUODENOSCOPY (EGD) WITH PROPOFOL;  Surgeon: WLucilla Lame MD;  [facility];  Service: Endoscopy;  Laterality: N/A;  . FRACTURE SURGERY Right 03/19/2015   wrist  . FRACTURE SURGERY Left   . HIATAL HERNIA REPAIR    . ILIAC ARTERY STENT  12/15/2005   PTA and direct stenting rgt and lft common iliac arteries  . KYPHOPLASTY N/A 04/05/2019   Procedure: KYPHOPLASTY T12, L3, L4 L5;  Surgeon: MHessie Knows MD;  Location: [facility] ORS;  Service: Orthopedics;  Laterality: N/A;  . NM GATED MYOVIEW ([facility])  05/'19; 8/'20   a) [facility]: Low Risk - no ischemia or infarct.  EF > 65%. no RWMA;; b) ([facility]: Normal wall motion.  No ischemia or infarction.  . OPEN REDUCTION INTERNAL FIXATION (ORIF) DISTAL RADIAL FRACTURE Left 01/13/2019   Procedure: OPEN REDUCTION INTERNAL FIXATION (ORIF) DISTAL  RADIAL FRACTURE - LEFT - SLEEP APNEA;  Surgeon: MHessie Knows MD;  Location: [facility] ORS;  Service: Orthopedics;  Laterality: Left;  . ORIF ELBOW FRACTURE Right 01/01/2017   Procedure: OPEN REDUCTION INTERNAL FIXATION (ORIF) ELBOW/OLECRANON FRACTURE;  Surgeon: MHessie Knows MD;  Location: [facility] ORS;  Service: Orthopedics;  Laterality: Right;  . ORIF WRIST FRACTURE Right 03/19/2015   Procedure: OPEN  REDUCTION INTERNAL FIXATION (ORIF) WRIST FRACTURE;  Surgeon: MHessie Knows MD;  Location: [facility] ORS;  Service: Orthopedics;  Laterality: Right;  . SHOULDER ARTHROSCOPY Right   . THORACIC AORTA - CAROTID ANGIOGRAM  October 2007   Dr. 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 TRANSTHORACIC ECHOCARDIOGRAM  February 2016   [facility]: Normal LV function. Dilated left atrium.  .Domingo DimesECHOCARDIOGRAM  04/2019   [facility] EF 55-60 %.  Mild LVH.  Relatively normal valves.    Family History  Problem Relation Age of Onset  . Ulcers Mother        Peptic  . Dementia Mother   . Alcohol abuse Father     Allergies  Allergen Reactions  . Clonazepam Other (See Comments)    Altered mental status  . Codeine Other (See Comments)    Altered mental status.  . Ropinirole Swelling    Other reaction(s): Hallucination  . Hydrocodone     confusion  . Niacin And Related Other (See Comments)    Flushing of skin  . Oxycodone Other (See Comments)    confusion confusion  . Tramadol     Other reaction(s): Hallucination    CBC Latest Ref Rng & Units 07/17/2020 05/14/2020 04/04/2020  WBC 3.4 - 10.8 x10E3/uL 7.8 7.2 7.5  Hemoglobin 13.0 - 17.7 g/dL 12.0(L) 11.8(L) 12.6(L)  Hematocrit 37.5 - 51.0 % 36.3(L) 35.0(L) 38.2(L)  Platelets 150 - 450 x10E3/uL 138(L) 135(L) 158      CMP     Component Value Date/Time   NA 140 05/14/2020 1133   NA 142 09/28/2014 0626   K 4.2 05/14/2020 1133   K 3.9 09/28/2014 0626   CL 105 05/14/2020 1133   CL 110 (H) 09/28/2014 0626   CO2 22 05/14/2020 1133   CO2 26 09/28/2014 0626   GLUCOSE 91 05/14/2020 1133   GLUCOSE 112 (H) 04/04/2020 1905   GLUCOSE 100 (H) 09/28/2014 0626   BUN 14 05/14/2020 1133   BUN 20 (H) 09/28/2014 0626   CREATININE 0.98 05/14/2020 1133   CREATININE 1.29 (H) 07/10/2017 1645   CALCIUM 9.1 05/14/2020 1133   CALCIUM 8.9 09/28/2014  0626   PROT 6.4 05/14/2020 1133   PROT 6.7 09/27/2014 1010   ALBUMIN 4.0 05/14/2020 1133   ALBUMIN 3.2 (L) 09/27/2014 1010   AST 12 05/14/2020 1133   AST 27 09/27/2014 1010   ALT 7 05/14/2020 1133   ALT 28 09/27/2014 1010   ALKPHOS 118 05/14/2020 1133   ALKPHOS 70 09/27/2014 1010   BILITOT 0.8 05/14/2020 1133   BILITOT 1.7 (H) 09/27/2014 1010   GFRNONAA 71 05/14/2020 1133   GFRNONAA 52 (L) 07/10/2017 1645   GFRAA 82 05/14/2020 1133   GFRAA 60 07/10/2017 1645     VAS [place] ABI WITH/WO TBI  Result Date: 07/20/2020 LOWER EXTREMITY DOPPLER STUDY Indications: Peripheral artery disease.  Comparison Study: 10/29/2017 Performing Technologist: Concha Norway RVT  Examination Guidelines: A complete evaluation includes at minimum, Doppler waveform signals and systolic blood pressure reading at the level of bilateral brachial, anterior tibial, and posterior tibial arteries, when vessel segments are accessible. Bilateral testing is considered an integral part of a complete examination. Photoelectric Plethysmograph (PPG) waveforms and toe systolic pressure readings are included as required and additional duplex testing as needed. Limited examinations for reoccurring indications may be performed as noted.  ABI Findings: +---------+------------------+-----+--------+--------+ Right    Rt Pressure (mmHg)IndexWaveformComment  +---------+------------------+-----+--------+--------+ Brachial 118                                     +---------+------------------+-----+--------+--------+ ATA      132               1.12 biphasic         +---------+------------------+-----+--------+--------+ PTA      135               1.14 biphasic         +---------+------------------+-----+--------+--------+ Great Toe86                0.73 Normal           +---------+------------------+-----+--------+--------+ +---------+------------------+-----+--------+-------+ Left     Lt Pressure  (mmHg)IndexWaveformComment +---------+------------------+-----+--------+-------+ ATA      128               1.08 biphasic        +---------+------------------+-----+--------+-------+ PTA      122               1.03 biphasic        +---------+------------------+-----+--------+-------+ Great Toe87                0.74 Normal          +---------+------------------+-----+--------+-------+ +-------+-----------+-----------+------------+------------+ ABI/TBIToday's ABIToday's TBIPrevious ABIPrevious TBI +-------+-----------+-----------+------------+------------+ Right  1.14       .73  1.08        .67          +-------+-----------+-----------+------------+------------+ Left   1.08       .74        1.02        .64          +-------+-----------+-----------+------------+------------+ Bilateral TBIs appear increased compared to prior study on 10/29/2017.  Summary: Right: Resting right ankle-brachial index is within normal range. No evidence of significant right lower extremity arterial disease. The right toe-brachial index is normal. Left: Resting left ankle-brachial index is within normal range. No evidence of significant left lower extremity arterial disease. The left toe-brachial index is normal.  *See table(s) above for measurements and observations.  Electronically signed by Leotis Pain MD on 07/20/2020 at 9:32:36 AM.   Final        Assessment & Plan:   1. PAD (peripheral artery disease) - bilateral common iliac stents  Recommend:  The patient has evidence of atherosclerosis of the lower extremities with claudication.  The patient does not voice lifestyle limiting changes at this point in time.  Noninvasive studies do not suggest clinically significant change.  No invasive studies, angiography or surgery at this time The patient should continue walking and begin a more formal exercise program.  The patient should continue antiplatelet therapy and aggressive treatment  of the lipid abnormalities  No changes in the patient's medications at this time  The patient should continue wearing graduated compression socks 10-15 mmHg strength to control the mild edema.    2. Essential hypertension Continue antihypertensive medications as already ordered, these medications have been reviewed and there are no changes at this time.   3. Bilateral carotid artery stenosis Recommend:  Given the patient's asymptomatic subcritical stenosis no further invasive testing or surgery at this time.  Duplex ultrasound shows <40% stenosis bilaterally.  Continue antiplatelet therapy as prescribed Continue management of CAD, HTN and Hyperlipidemia Healthy heart diet,  encouraged exercise at least 4 times per week Follow up in 12 months with duplex ultrasound and physical exam    Current Outpatient Medications on File Prior to Visit  Medication Sig Dispense Refill  . acetaminophen (TYLENOL) 500 MG tablet Take 500-1,000 mg by mouth every 6 (six) hours as needed for mild pain or moderate pain.     . Ascorbic Acid (VITAMIN C) 1000 MG tablet Take 1,000 mg by mouth daily.    . Cyanocobalamin (VITAMIN B-12) 500 MCG SUBL Place 500 mcg under the tongue daily.     [name] ELIQUIS 5 MG TABS tablet TAKE 1 TABLET BY MOUTH TWICE DAILY 180 tablet 1  . ezetimibe (ZETIA) 10 MG tablet TAKE 1 TABLET BY MOUTH DAILY 90 tablet 0  . finasteride (PROSCAR) 5 MG tablet TAKE ONE TABLET BY MOUTH EVERY DAY 90 tablet 3  . fluticasone (FLONASE) 50 MCG/ACT nasal spray PLACE TWO SPRAYS IN BOTH NOSRTILS EVERY DAY AS NEEDED FOR ALLERGIES 16 g 4  . Magnesium Oxide 500 MG TABS Take 500 mg by mouth every evening.    . melatonin 3 MG TABS tablet Take 0.5 tablets (1.5 mg total) by mouth at bedtime.  0  . metoprolol succinate (TOPROL-XL) 50 MG 24 hr tablet TAKE ONE TABLET BY MOUTH EVERY DAY TAKE WITH OR IMMEDIATELY FOLLOWING A MEAL 90 tablet 0  . Multiple Vitamins-Minerals (PRESERVISION AREDS 2 PO) Take 1 tablet by mouth  2 (two) times daily.     [name] MYRBETRIQ 50 MG TB24 tablet TAKE ONE TABLET BY  MOUTH EVERY DAY 30 tablet 11  . nitroGLYCERIN (NITROSTAT) 0.4 MG SL tablet Place 1 tablet (0.4 mg total) under the tongue every 5 (five) minutes as needed for chest pain. 20 tablet 1  . pantoprazole (PROTONIX) 40 MG tablet TAKE ONE TABLET BY MOUTH EVERY DAY 90 tablet 1  . QUEtiapine (SEROQUEL) 25 MG tablet Take 12.5 mg by mouth.     . rosuvastatin (CRESTOR) 20 MG tablet TAKE ONE TABLET BY MOUTH EVERY DAY 90 tablet 4  . umeclidinium-vilanterol (ANORO ELLIPTA) 62.5-25 MCG/INH AEPB Inhale 1 puff into the lungs daily.     [name] lisinopril (ZESTRIL) 2.5 MG tablet Take 1 tablet (2.5 mg total) by mouth daily.     No current facility-administered medications on file prior to visit.    There are no Patient Instructions on file for this visit. No follow-ups on file.   Kris Hartmann, NP

## 2020-07-24 ENCOUNTER — Encounter: Payer: Self-pay | Admitting: Family Medicine

## 2020-07-24 ENCOUNTER — Other Ambulatory Visit: Payer: Medicare Other | Admitting: Adult Health Nurse Practitioner

## 2020-07-24 ENCOUNTER — Ambulatory Visit: Payer: Medicare Other

## 2020-07-24 ENCOUNTER — Other Ambulatory Visit: Payer: Self-pay

## 2020-07-24 DIAGNOSIS — R296 Repeated falls: Secondary | ICD-10-CM

## 2020-07-24 DIAGNOSIS — M6281 Muscle weakness (generalized): Secondary | ICD-10-CM

## 2020-07-24 DIAGNOSIS — R2689 Other abnormalities of gait and mobility: Secondary | ICD-10-CM

## 2020-07-24 DIAGNOSIS — F0280 Dementia in other diseases classified elsewhere without behavioral disturbance: Secondary | ICD-10-CM

## 2020-07-24 DIAGNOSIS — G3183 Dementia with Lewy bodies: Secondary | ICD-10-CM

## 2020-07-24 DIAGNOSIS — R2681 Unsteadiness on feet: Secondary | ICD-10-CM

## 2020-07-24 DIAGNOSIS — Z515 Encounter for palliative care: Secondary | ICD-10-CM

## 2020-07-24 NOTE — Therapy (Signed)
[place] MAIN [facility] SERVICES [address] [place], [place], [address] Phone: [phone]   Fax:  [phone]  Physical Therapy Treatment  Patient Details  Name: [name] MRN: [id number] Date of Birth: 02-27-1934 Referring Provider (PT): Jennings Books K    Encounter Date: 07/24/2020   PT End of Session - 07/24/20 0851    Visit Number 4    Number of Visits 24    Date for PT Re-Evaluation 10/04/20    Authorization Type 4/10 eval 07/12/20    PT Start Time 0845    PT Stop Time 0929    PT Time Calculation (min) 44 min    Equipment Utilized During Treatment Gait belt    Activity Tolerance Patient tolerated treatment well;Patient limited by fatigue    Behavior During Therapy [facility] for tasks assessed/performed           Past Medical History:  Diagnosis Date   AAA (abdominal aortic aneurysm) ([place]) 05/2019   [place] Rapid progression from June-Aug 2020 (4.6 - 5.4 cm): s/p EVAR (Dr. Lucky Cowboy [facility]): EVAR - 23 mm prox, 12 cm distal & x 18 cm length Gore Excluder Endoprosthesis Main body (via RFA distal to lowest Renal A); 59m x 14 cm L Contralateral Limb for L Iliac - extended with 8 mm x 6cm LifeStar stent (post-dilated with 7 mm DEB).  Additional R Iliac PTA not required.    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 ([place] 09/15/2017   09-15-2017 DIAGNOSIS:  A. COLON POLYP X 2, ASCENDING; COLD SNARE:  - TUBULAR ADENOMA.  - NEGATIVE FOR HIGH-GRADE DYSPLASIA AND MALIGNANCY.  - NO ADDITIONAL TISSUE IDENTIFIED; FECAL MATERIAL PRESENT.   B. COLON POLYP X 2, DESCENDING; COLD SNARE:  - ADENOCARCINOMA ARISING IN A DYSPLASTIC SESSILE SERRATED ADENOMA, WITH  LYMPHOVASCULAR INVASION, SEE COMMENT.  - TUBULAR ADENOMA, 2 FRAGMENTS, NEGATIVE FO   Arthritis    Basal cell carcinoma of eyelid 01/03/2013   2019 - R side of Nose   Benign neoplasm of ascending colon    Benign neoplasm of descending colon    Bilateral iliac artery stenosis ([place] 2007   Bilateral  Iliac A stenting; extension of EVAR limbs into both Iliacs -- additional stent placed in L Iliac.   Cancer ([facility]    Skin cancer   Colon polyps    Complication of anesthesia    severe confusion agitation requiring hospital admission x 4 days   Compression fracture of fourth lumbar vertebra ([place] 04/2019   - s/p Kyphoplasty; T12, L3-5 ([facility]   COPD (chronic obstructive pulmonary disease) (HCC)    Centrilobular Emphysema   Coronary artery disease involving native coronary artery without angina pectoris 1998   - s/p CABG, occluded SVG-D1; patent LIMA-LAD, SVG-OM, SVG- RCA   Dementia ([place]    memory loss - started noting late 2019   Falls frequently    broke foot   Fracture 2021   left foot   GERD (gastroesophageal reflux disease)    Hemorrhoid 02/10/2015   History of hiatal hernia    History of kidney stones    HOH (hard of hearing)    Hypertension    Lewy body dementia ([place] 04/04/2020   Macular degeneration disease    Osteoporosis    PAF (paroxysmal atrial fibrillation) (HCC)    on Eliquis for OAC - Rate Control    Prostate cancer (HCC)    Prostate disorder    Sciatic pain    Chronic   Sleep apnea  cpap   Temporary cerebral vascular dysfunction 02/10/2015   Had negative work-up.  July, 2012.  No medication changes, had forgot them that day, got dehydrated.    Urinary incontinence     Past Surgical History:  Procedure Laterality Date   ABDOMINAL AORTIC ENDOVASCULAR STENT GRAFT Bilateral 05/18/2019   Procedure: ABDOMINAL AORTIC ENDOVASCULAR STENT GRAFT;  Surgeon: Algernon Huxley, MD;  Location: [facility] ORS;  Service: Vascular;  Laterality: Bilateral;   ANKLE SURGERY Left    ORIF    Arch aortogram and carotid aortogram  06/02/2006   Dr Oneida Alar did surgery   BASAL CELL CARCINOMA EXCISION  04/2016   Dermatology   CARDIAC CATHETERIZATION  01/30/1998    (Dr. Linard Millers): Native LAD & mRCA CTO. 90% D1. Mod Cx. SVG-D1 CTO. SVG-RCA & SVG-OM along with  LIMA-LAD patent;  LV NORMAL Fxn   CAROTID ENDARTERECTOMY Left Oct. 29, 2007   Dr. Oneida Alar:   CATARACT EXTRACTION W/PHACO Right 03/05/2017   Procedure: CATARACT EXTRACTION PHACO AND INTRAOCULAR LENS PLACEMENT ([place]);  Surgeon: Leandrew Koyanagi, MD;  Location: [facility] ORS;  Service: Ophthalmology;  Laterality: Right;  [place] 00:58.5 AP% 10.6 CDE 6.20 Fluid Pack lot # [id number]   CATARACT EXTRACTION W/PHACO Left 04/15/2017   Procedure: CATARACT EXTRACTION PHACO AND INTRAOCULAR LENS PLACEMENT ([place])  Left;  Surgeon: Leandrew Koyanagi, MD;  Location: [place];  Service: Ophthalmology;  Laterality: Left;   COLONOSCOPY WITH PROPOFOL N/A 09/15/2017   Procedure: COLONOSCOPY WITH PROPOFOL;  Surgeon: Lucilla Lame, MD;  Location: [facility] ENDOSCOPY;  Service: Endoscopy;  Laterality: N/A;   COLONOSCOPY WITH PROPOFOL N/A 10/26/2018   Procedure: COLONOSCOPY WITH PROPOFOL;  Surgeon: Lucilla Lame, MD;  Location: [name] ENDOSCOPY;  Service: Endoscopy;  Laterality: N/A;   CORONARY ARTERY BYPASS GRAFT  1998   LIMA-LAD, SVG-OM, SVG-RPDA, SVG-DIAG   CPET / MET  12/2012   Mild chronotropic incompetence - read 82% of predicted; also reduced effort; peak VO2 15.7 / 75% (did not reach Max effort) -- suggested ischemic response in last 1.5 minutes of exercise. Normal pulmonary function on PFTs but poor response to   CYSTOSCOPY W/ RETROGRADES Left 01/13/2020   Procedure: CYSTOSCOPY WITH RETROGRADE PYELOGRAM;  Surgeon: Billey Co, MD;  Location: [facility] ORS;  Service: Urology;  Laterality: Left;   CYSTOSCOPY WITH STENT PLACEMENT Left 12/26/2019   Procedure: CYSTOSCOPY WITH STENT PLACEMENT;  Surgeon: Billey Co, MD;  Location: [facility] ORS;  Service: Urology;  Laterality: Left;   CYSTOSCOPY/URETEROSCOPY/HOLMIUM LASER/STENT PLACEMENT Left 01/13/2020   Procedure: CYSTOSCOPY/URETEROSCOPY/HOLMIUM LASER/STENT EXCHANGE;  Surgeon: Billey Co, MD;  Location: [facility] ORS;  Service: Urology;  Laterality: Left;    ENDOVASCULAR REPAIR/STENT GRAFT N/A 05/18/2019   Procedure: ENDOVASCULAR REPAIR/STENT GRAFT;  Surgeon: Algernon Huxley, MD;  Location: [facility] INVASIVE CV LAB;; EVAR - 23 mm prox, 12 cm distal & x 18 cm length Gore Excluder Endoprosthesis Main body (via RFA distal to lowest Renal A); 43m x 14 cm L Contralateral Limb for L Iliac - extended with 8 mm x 6cm LifeStar stent; no additional R Iliac PTA needed.    ESOPHAGOGASTRODUODENOSCOPY (EGD) WITH PROPOFOL N/A 09/15/2017   Procedure: ESOPHAGOGASTRODUODENOSCOPY (EGD) WITH PROPOFOL;  Surgeon: WLucilla Lame MD;  [facility];  Service: Endoscopy;  Laterality: N/A;   FRACTURE SURGERY Right 03/19/2015   wrist   FRACTURE SURGERY Left    HIATAL HERNIA REPAIR     ILIAC ARTERY STENT  12/15/2005   PTA and direct stenting rgt and lft common iliac arteries   KYPHOPLASTY  N/A 04/05/2019   Procedure: KYPHOPLASTY T12, L3, L4 L5;  Surgeon: Hessie Knows, MD;  Location: [facility] ORS;  Service: Orthopedics;  Laterality: N/A;   NM GATED MYOVIEW ([place] HX)  05/'19; 8/'20   a) [facility]: Low Risk - no ischemia or infarct.  EF > 65%. no RWMA;; b) [facility]): Normal wall motion.  No ischemia or infarction.   OPEN REDUCTION INTERNAL FIXATION (ORIF) DISTAL RADIAL FRACTURE Left 01/13/2019   Procedure: OPEN REDUCTION INTERNAL FIXATION (ORIF) DISTAL  RADIAL FRACTURE - LEFT - SLEEP APNEA;  Surgeon: Hessie Knows, MD;  Location: [facility] ORS;  Service: Orthopedics;  Laterality: Left;   ORIF ELBOW FRACTURE Right 01/01/2017   Procedure: OPEN REDUCTION INTERNAL FIXATION (ORIF) ELBOW/OLECRANON FRACTURE;  Surgeon: Hessie Knows, MD;  Location: [facility] ORS;  Service: Orthopedics;  Laterality: Right;   ORIF WRIST FRACTURE Right 03/19/2015   Procedure: OPEN REDUCTION INTERNAL FIXATION (ORIF) WRIST FRACTURE;  Surgeon: Hessie Knows, MD;  Location: [facility] ORS;  Service: Orthopedics;  Laterality: Right;   SHOULDER ARTHROSCOPY Right    THORACIC AORTA - CAROTID ANGIOGRAM  October 2007   Dr.  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 Dilated left atrium.   TRANSTHORACIC ECHOCARDIOGRAM  04/2019   [facility] April 20, 2019): EF 55-60 %.  Mild LVH.  Relatively normal valves.    There were no vitals filed for this visit.   Subjective Assessment - 07/24/20 0849    Subjective Patient reports having bad back pain that began a few days ago. Significant other reports he has not been doing well lately.    Patient is accompained by: --   aide   Pertinent History Patient is returning to PT for evaluation of balance. Patient has been seen by this therapist in the past. PMH AAA, arthritis, basal cell carcinoma of eyelid, benign neoplasm of ascending/descending colon, bilateral iliac artery stenosis, colon polyps, compression fx of 4th lumbar vertebra (s/p kyphoplasty T12, L3-5), COPD, CAD, dementia, falls,  GERD, hemorroids, HOH, HTN, Lewy Body Dementia, macular degeneration disease, osteoporosis, PAF, prostate cancer, prostate disorder, sciatic pain, sleep apnea, temporary cerebral vascular dysfunction. Comes with aide, having multiple near falls per day per aide, mainly to the sides.    Limitations Walking;Standing;House hold activities;Other (comment);Lifting;Sitting    How long can you sit comfortably? n/a back pain no longer limiting    How long can you stand comfortably? 10-12 with walker    How long can you walk comfortably? walks 15 minutes with a walker    Patient Stated Goals walk without stumbling, be able to be steady enough to not have to have someone holding his gait belt.    Currently in Pain? Yes    Pain Score 8     Pain Location Back    Pain Orientation Lower    Pain Descriptors / Indicators Aching    Pain Type Chronic pain    Pain Onset In the past 7 days    Pain Frequency Intermittent                       TherEx: CGA with cues for sequencing, body mechanics, and safety    Standing with # 4 ankle weight: CGA for stability -March with 2x4 between feet 12x each LE mod cueing for sequencing and placement of foot due to tendency for narrow BOS -hip abduction 10x each  LE cues for foot clearance.  -hip extension 10x each LE, cues for upright posture 10x each LE    seated: 4lb ankle weight -LAQ 15x each LE  -march 15x each LE   GTB around ankles:  -alternating ER/IR 15x each LE    Neuro Re-ed: CGA with cues for sequencing, body mechanics, and safety     airex pad: modified tandem stance with 4" step 2x 60 seconds each foot placement    airex pad; throw ball at target  and close CGA from PT, patient experienced two near posterior LOB requiring cueing for finding Com x 16 balls   Lateral step over and back orange hurdle 10x each LE; BUE support   bosu ball, blue side up: modified forward lunges 15x each LE; BUE support    backwards ambulation 6x length of // bars with close CGA; BUE support  [place] 4x length of // bars with BUE support   Donning/doffing jacket in standing, requires Mod A and max cueing for sequencing.    Pt educated throughout session about proper posture and technique with exercises. Improved exercise technique, movement at target joints, use of target muscles after min to mod verbal, visual, tactile cues                 PT Education - 07/24/20 0850    Education provided Yes    Education Details exercise technique, body mechanics, stability    Person(s) Educated Patient    Methods Explanation;Demonstration;Tactile cues;Verbal cues    Comprehension Verbalized understanding;Returned demonstration;Verbal cues required;Tactile cues required            PT Short Term Goals - 07/12/20 0959      PT SHORT TERM GOAL #1   Title Patient will be independent in home exercise program to improve strength/mobility for better functional  independence with ADLs.    Baseline 11/11: HEP given    Time 4    Period Weeks    Status New    Target Date 08/09/20             PT Long Term Goals - 07/12/20 0959      PT LONG TERM GOAL #1   Title Patient will increase FOTO score to equal to or greater than 51% to demonstrate statistically significant improvement in mobility and quality of life.    Baseline 11/11: 42%    Time 12    Period Weeks    Status New    Target Date 10/04/20      PT LONG TERM GOAL #2   Title Patient will demonstrate an improved Berg Balance Score of > 40/56 as to demonstrate improved balance with ADLs such as sitting/standing and transfer balance and reduced fall risk.    Baseline 11/11: 34/56    Time 12    Period Weeks    Status New    Target Date 10/04/20      PT LONG TERM GOAL #3   Title Patient (> 52 years old) will complete five times sit to stand test in < 15 seconds without UE support indicating an increased LE strength and improved balance    Baseline 11/11; 23.35 seconds with UE support    Time 12    Period Weeks    Status New    Target Date 10/04/20      PT LONG TERM GOAL #4   Title Patient will increase 10 meter walk test to >1.27ms with LRAD  as to improve gait speed for better community  ambulation and to reduce fall risk.    Baseline 11/11: 0.74 m/s without AD with one near LOB    Time 12    Period Weeks    Status New    Target Date 10/04/20      PT LONG TERM GOAL #5   Title Patient will increase BLE gross strength to 4+/5 as to improve functional strength for independent gait, increased standing tolerance and increased ADL ability.    Baseline 11/11: see note    Time 12    Period Weeks    Status New    Target Date 10/04/20                 Plan - 07/24/20 0916    Clinical Impression Statement Patient is more confused this session requiring more frequent cueing for task orientation. Use of demonstration allows for performance of task with patient tolerating standing  and seated interventions well this session. Back pain does not limit stability interventions and is reported as decreased by end of session. Patient will benefit from skilled physical therapy to increase strength, balance, and mobility to allow patient to reduce fall risk and increase independence in daily life.    Personal Factors and Comorbidities Age;Comorbidity 3+;Fitness;Past/Current Experience;Sex;Social Background;Time since onset of injury/illness/exacerbation;Transportation    Comorbidities AAA, arthritis, atherosclerotic heart disease, (s/p CABG), benign neoplasm of ascending/descending colon, COPD, CAD, dementia, dysrhythmia, frequent falls, GERD, hemorrhoid, HOH, HTN, PAF, prostate cancer.    Examination-Activity Limitations Bathing;Bend;Carry;Dressing;Continence;Stairs;Squat;Reach Overhead;Locomotion Level;Lift;Stand;Transfers;Toileting;Bed Mobility    Examination-Participation Restrictions Church;Cleaning;Community Activity;Driving;Interpersonal Relationship;Laundry;Volunteer;Shop;Personal Finances;Meal Prep;Yard Work    [employer] Evolving/Moderate complexity    Rehab Potential Fair    PT Frequency 2x / week    PT Duration 12 weeks    PT Treatment/Interventions ADLs/Self Care Home Management;Aquatic Therapy;Cryotherapy;Moist Heat;Traction;Ultrasound;Therapeutic activities;Functional mobility training;Stair training;Gait training;DME Instruction;Therapeutic exercise;Balance training;Neuromuscular re-education;Patient/family education;Manual techniques;Passive range of motion;Energy conservation;Vestibular;Taping;Biofeedback;Electrical Stimulation;Iontophoresis 70m/ml Dexamethasone;Dry needling;Visual/perceptual remediation/compensation;Cognitive remediation;Canalith Repostioning    PT Next Visit Plan balance, strength    PT Home Exercise Plan see above    Consulted and Agree with Plan of Care Patient;Family member/caregiver    Family Member Consulted aide            Patient will benefit from skilled therapeutic intervention in order to improve the following deficits and impairments:  Abnormal gait, Cardiopulmonary status limiting activity, Decreased activity tolerance, Decreased balance, Decreased knowledge of precautions, Decreased endurance, Decreased coordination, Decreased cognition, Decreased knowledge of use of DME, Decreased mobility, Difficulty walking, Decreased safety awareness, Decreased strength, Impaired flexibility, Impaired perceived functional ability, Impaired sensation, Postural dysfunction, Improper body mechanics, Pain, Impaired vision/preception, Decreased range of motion  Visit Diagnosis: Unsteadiness on feet  Muscle weakness (generalized)  Other abnormalities of gait and mobility     Problem List Patient Active Problem List   Diagnosis Date Noted   AAA (abdominal aortic aneurysm) without rupture ([place] 06/29/2020   LBD (Lewy body dementia) ([place] 05/14/2020   Numbness and tingling in left hand 03/02/2020   Hematuria    Acute kidney injury superimposed on CKD ([place]    Sepsis due to gram-negative UTI ([place] 12/26/2019   Left nephrolithiasis 12/26/2019   Hydronephrosis, left 12/26/2019   Status post abdominal aortic aneurysm (AAA) repair 04/15/2019   Do not intubate, cardiopulmonary resuscitation (CPR)-only code status 10/08/2018   Prostate cancer ([place] 02/25/2018   Prostatic cyst 01/15/2018   Blood in stool    Abnormal feces    Stricture and stenosis of esophagus    Mild cognitive impairment 08/18/2017  Senile purpura ([place]) 04/29/2017   Anemia 04/29/2017   Frequent falls 04/29/2017   Simple chronic bronchitis ([place]) 12/17/2016   Benign localized hyperplasia of prostate with urinary obstruction 07/15/2016   Elevated PSA 07/15/2016   Incomplete emptying of bladder 07/15/2016   Urge incontinence 07/15/2016   Hypothyroidism 04/25/2016   Macular degeneration 04/25/2016   Erectile dysfunction  due to arterial insufficiency    Ischemic heart disease with chronotropic incompetence    CN (constipation) 02/10/2015   DD (diverticular disease) 02/10/2015   Weakness 27/63/9432   Lichen planus 00/37/9444   Restless leg 02/10/2015   Circadian rhythm disorder 02/10/2015   B12 deficiency 02/10/2015   COPD (chronic obstructive pulmonary disease) ([place]) 11/14/2014   Post Inflammatory Lung Changes 11/14/2014   PAF (paroxysmal atrial fibrillation) (HCC) CHA2DS2-VASc = 4. AC = Eliquis    Insomnia 12/09/2013   OSA on CPAP    Dyspnea on exertion - -essentially resolved with BB dose reduction & wgt loss 03/29/2013   Overweight (BMI 25.0-29.9) -- Wgt back up 01/17/2013   Atherosclerotic heart disease of native coronary artery without angina pectoris - s/p CABG, occluded SVG-D1; patent LIMA-LAD, SVG-OM, SVG- RCA    Essential hypertension    Hyperlipidemia with target LDL less than 70    Aorto-iliac disease ([place])    BCC (basal cell carcinoma), eyelid 01/03/2013   Allergic rhinitis 08/17/2009   Adaptation reaction 05/28/2009   Acid reflux 05/28/2009   Arthritis, degenerative 05/28/2009   Abnormality of aortic arch branch 06/01/2006   Carotid artery occlusion without infarction 06/01/2006   PAD (peripheral artery disease) - bilateral common iliac stents 12/15/2005   Atherosclerotic heart disease of artery bypass graft 09/01/1997   Janna Arch, PT, DPT   07/24/2020, 9:29 AM  [place] MAIN [facility] SERVICES [address] [place], [place], [address] Phone: [phone]   Fax:  [phone]  Name: [name] MRN: [id number] Date of Birth: Sep 06, 1933

## 2020-07-24 NOTE — Progress Notes (Signed)
[facility] Consult Note Telephone: [phone]  Fax: [phone]  PATIENT NAME: [name] DOB: 16-Apr-1934 MRN: [id number]  PRIMARY CARE PROVIDER:   [name], MD  REFERRING PROVIDER:  [name], MD [address] [place],  [place] [address]  RESPONSIBLE PARTY:  Oluwatobi Visser, son [phone] Email [email or web address] [name], SO C: [phone] H: [phone]  Chief complaint: Follow-up palliative visit for complex medical decision making    RECOMMENDATIONS and PLAN:  1.  Advanced care planning.  Patient is DNR.  Filled out MOST today indicating DNR, limited hospital interventions, antibiotics when infection occurs, IV fluids if indicated, no feeding tube.  DNR previously filled out this has been uploaded to [facility] along with MOST form and originals left in the home.  Did spend 16 minutes discussing ACP with patient, son, significant other.  2.  Dementia/frequent falls.  FAST 6B.  Family indicates that he has not had any new falls since last visit but is unsteady on his feet.  Patient continues with outpatient rehab.  Otherwise stable functional status.  Continue outpatient rehab as ordered  3.  Anxiety/agitation.  Continue techniques to improve sleep and redirection as able.  Patient currently on Seroquel 12.5 mg nightly and melatonin 3 mg nightly.  Patient originally prescribed Seroquel 25 mg nightly but this was making him too drowsy and was started on half a tab.  Did discuss with family that if improving sleep and redirection continue not to be effective we could increase this up to a whole tab again but monitor for excessive drowsiness.  4.  Nutritional status.  Current weight is 174 pounds with BMI of 25.08.  This is a loss of an additional 4 pounds from last visit on 06/13/2020 in which he weighed 178 pounds.  Patient does state that his appetite is improving.  Continue to monitor  weight  5.  Support.  Significant other is asking for information on support groups locally.  Have given her [place] information to contact and encourage contacting them for help with support groups.  Will also reach out to social worker for any support group resources she may have.  Palliative will continue to monitor for symptom management/decline and make recommendations as needed.  Next appointment is in 12 weeks.  Family encouraged to call with any questions or concerns   HISTORY OF PRESENT ILLNESS:  [name] is a 86 y.o. 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 fib, CAD status post CABG x4, OSA on CPAP, COPD, HTN. Palliative Care was asked to help address goals of care.  HPI obtained from patient, significant other, son, chart review.  Patient had recent ABIs of right and left leg taken on 07/18/2020 which was within normal limits.  Patient also had ultrasound of bilateral carotid arteries showing stenosis 1 to 39% of the right ICA.  Patient asymptomatic and no further invasive testing or intervention is warranted at this time.  Son and significant other to indicate that patient is having some increased agitation and will become fixated on certain things at times that makes it hard for him to be redirected.  Son does state that he has noticed that if they tell him he has an appointment the next day that he does not sleep well.  Decreased sleep could be attributing to the increased agitation.  Son states that they have started not telling him until the day  of his appointment that he is having an appointment.  Wants to see if this will help before increasing any medication.  Remaining 10 point ROS asked and negative.  CODE STATUS: DNR  PPS: 50% HOSPICE ELIGIBILITY/DIAGNOSIS: TBD  PHYSICAL EXAM:  BP 132/70 HR 63 O2 97% on room air General: NAD, frail appearing Eyes: Anicteric and noninjected with no discharge  noted ENMT: Moist mucous membranes Cardiovascular: regular rate and rhythm Pulmonary: Lung sounds clear; normal respiratory effort Abdomen: soft, nontender, + bowel sounds Extremities: no edema, no joint deformities Skin: no rashes on exposed skin Neurological: Weakness; A&O to person and place  PAST MEDICAL HISTORY:  Past Medical History:  Diagnosis Date  . AAA (abdominal aortic aneurysm) ([place]) 05/2019   Relativley Rapid progression from June-Aug 2020 (4.6 - 5.4 cm): s/p EVAR (Dr. Lucky Cowboy [facility]): EVAR - 23 mm prox, 12 cm distal & x 18 cm length Gore Excluder Endoprosthesis Main body (via RFA distal to lowest Renal A); 31mm x 14 cm L Contralateral Limb for L Iliac - extended with 8 mm x 6cm LifeStar stent (post-dilated with 7 mm DEB).  Additional R Iliac PTA not required.   . 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 ([place]) 09/15/2017   09-15-2017 DIAGNOSIS:  A. COLON POLYP X 2, ASCENDING; COLD SNARE:  - TUBULAR ADENOMA.  - NEGATIVE FOR HIGH-GRADE DYSPLASIA AND MALIGNANCY.  - NO ADDITIONAL TISSUE IDENTIFIED; FECAL MATERIAL PRESENT.   B. COLON POLYP X 2, DESCENDING; COLD SNARE:  - ADENOCARCINOMA ARISING IN A DYSPLASTIC SESSILE SERRATED ADENOMA, WITH  LYMPHOVASCULAR INVASION, SEE COMMENT.  - TUBULAR ADENOMA, 2 FRAGMENTS, NEGATIVE FO  . Arthritis   . Basal cell carcinoma of eyelid 01/03/2013   2019 - R side of Nose  . Benign neoplasm of ascending colon   . Benign neoplasm of descending colon   . Bilateral iliac artery stenosis ([place]) 2007   Bilateral Iliac A stenting; extension of EVAR limbs into both Iliacs -- additional stent placed in L Iliac.  . Cancer ([place])    Skin cancer  . Colon polyps   . Complication of anesthesia    severe confusion agitation requiring hospital admission x 4 days  . Compression fracture of fourth lumbar vertebra ([place]) 04/2019   - s/p Kyphoplasty; T12, L3-5 [facility])  . COPD (chronic obstructive pulmonary disease) (HCC)    Centrilobular Emphysema  . Coronary artery disease  involving native coronary artery without angina pectoris 1998   - s/p CABG, occluded SVG-D1; patent LIMA-LAD, SVG-OM, SVG- RCA  . Dementia ([place])    memory loss - started noting late 2019  . Falls frequently    broke foot  . Fracture 2021   left foot  . GERD (gastroesophageal reflux disease)   . Hemorrhoid 02/10/2015  . History of hiatal hernia   . History of kidney stones   . HOH (hard of hearing)   . Hypertension   . Lewy body dementia ([place]) 04/04/2020  . Macular degeneration disease   . Osteoporosis   . PAF (paroxysmal atrial fibrillation) ([place])    on Eliquis for OAC - Rate Control   . Prostate cancer ([place])   . Prostate disorder   . Sciatic pain    Chronic  . Sleep apnea    cpap  . Temporary cerebral vascular dysfunction 02/10/2015   Had negative work-up.  July, 2012.  No medication changes, had forgot them that day, got dehydrated.   . Urinary incontinence     SOCIAL HX:  Social History   Tobacco  Use  . Smoking status: Former Smoker    Types: Cigarettes    Quit date: 08/04/1997    Years since quitting: 22.9  . Smokeless tobacco: Never Used  Substance Use Topics  . Alcohol use: No    ALLERGIES:  Allergies  Allergen Reactions  . Clonazepam Other (See Comments)    Altered mental status  . Codeine Other (See Comments)    Altered mental status.  . Ropinirole Swelling    Other reaction(s): Hallucination  . Hydrocodone     confusion  . Niacin And Related Other (See Comments)    Flushing of skin  . Oxycodone Other (See Comments)    confusion confusion  . Tramadol     Other reaction(s): Hallucination     PERTINENT MEDICATIONS:  Outpatient Encounter Medications as of 07/24/2020  Medication Sig  . acetaminophen (TYLENOL) 500 MG tablet Take 500-1,000 mg by mouth every 6 (six) hours as needed for mild pain or moderate pain.   . Ascorbic Acid (VITAMIN C) 1000 MG tablet Take 1,000 mg by mouth daily.  . Cyanocobalamin (VITAMIN B-12) 500 MCG SUBL Place 500 mcg under  the tongue daily.   [name] ELIQUIS 5 MG TABS tablet TAKE 1 TABLET BY MOUTH TWICE DAILY  . ezetimibe (ZETIA) 10 MG tablet TAKE 1 TABLET BY MOUTH DAILY  . finasteride (PROSCAR) 5 MG tablet TAKE ONE TABLET BY MOUTH EVERY DAY  . fluticasone (FLONASE) 50 MCG/ACT nasal spray PLACE TWO SPRAYS IN BOTH NOSRTILS EVERY DAY AS NEEDED FOR ALLERGIES  . lisinopril (ZESTRIL) 2.5 MG tablet Take 1 tablet (2.5 mg total) by mouth daily.  . Magnesium Oxide 500 MG TABS Take 500 mg by mouth every evening.  . melatonin 3 MG TABS tablet Take 0.5 tablets (1.5 mg total) by mouth at bedtime.  . metoprolol succinate (TOPROL-XL) 50 MG 24 hr tablet TAKE ONE TABLET BY MOUTH EVERY DAY TAKE WITH OR IMMEDIATELY FOLLOWING A MEAL  . Multiple Vitamins-Minerals (PRESERVISION AREDS 2 PO) Take 1 tablet by mouth 2 (two) times daily.   [name] MYRBETRIQ 50 MG TB24 tablet TAKE ONE TABLET BY MOUTH EVERY DAY  . nitroGLYCERIN (NITROSTAT) 0.4 MG SL tablet Place 1 tablet (0.4 mg total) under the tongue every 5 (five) minutes as needed for chest pain.  . pantoprazole (PROTONIX) 40 MG tablet TAKE ONE TABLET BY MOUTH EVERY DAY  . QUEtiapine (SEROQUEL) 25 MG tablet Take 12.5 mg by mouth.   . rosuvastatin (CRESTOR) 20 MG tablet TAKE ONE TABLET BY MOUTH EVERY DAY  . umeclidinium-vilanterol (ANORO ELLIPTA) 62.5-25 MCG/INH AEPB Inhale 1 puff into the lungs daily.    No facility-administered encounter medications on file as of 07/24/2020.     Lyonel Morejon Jenetta Downer, NP

## 2020-07-30 NOTE — Telephone Encounter (Signed)
Pt's son called asking if Dr. Caryn Section has read his message and wants to know if Dr. Caryn Section will respond/  [phone]

## 2020-07-30 NOTE — Telephone Encounter (Signed)
I'm just know getting to it since I've been out of the office since last Wednesday. Will get it taken care of this week.

## 2020-08-01 ENCOUNTER — Encounter: Payer: Self-pay | Admitting: Cardiology

## 2020-08-01 ENCOUNTER — Telehealth (INDEPENDENT_AMBULATORY_CARE_PROVIDER_SITE_OTHER): Payer: Medicare Other | Admitting: Cardiology

## 2020-08-01 VITALS — BP 125/99 | HR 78 | Ht 70.0 in | Wt 172.0 lb

## 2020-08-01 DIAGNOSIS — G3183 Dementia with Lewy bodies: Secondary | ICD-10-CM | POA: Diagnosis not present

## 2020-08-01 DIAGNOSIS — I739 Peripheral vascular disease, unspecified: Secondary | ICD-10-CM

## 2020-08-01 DIAGNOSIS — F0280 Dementia in other diseases classified elsewhere without behavioral disturbance: Secondary | ICD-10-CM

## 2020-08-01 DIAGNOSIS — I6523 Occlusion and stenosis of bilateral carotid arteries: Secondary | ICD-10-CM

## 2020-08-01 DIAGNOSIS — Z951 Presence of aortocoronary bypass graft: Secondary | ICD-10-CM | POA: Diagnosis not present

## 2020-08-01 DIAGNOSIS — I480 Paroxysmal atrial fibrillation: Secondary | ICD-10-CM | POA: Diagnosis not present

## 2020-08-01 NOTE — Telephone Encounter (Signed)
Letter printed, signed and sent to medical records to fax and send to patient.

## 2020-08-01 NOTE — Progress Notes (Signed)
Virtual Visit via Telephone Note   This visit type was conducted due to national recommendations for restrictions regarding the COVID-19 Pandemic (e.g. social distancing) in an effort to limit this patient's exposure and mitigate transmission in our community.  Due to his co-morbid illnesses, this patient is at least at moderate risk for complications without adequate follow up.  This format is felt to be most appropriate for this patient at this time.  The patient did not have access to video technology/had technical difficulties with video requiring transitioning to audio format only (telephone).  All issues noted in this document were discussed and addressed.  No physical exam could be performed with this format.  Please refer to the patient's chart for his  consent to telehealth for [facility].    Date:  08/01/2020   ID:  [name], DOB Jan 06, 1934, MRN [id number] The patient was identified using 2 identifiers.  Patient Location: Home Provider Location: Home Office  PCP:  [name], MD  Cardiologist:  [name], MD  Electrophysiologist:  None   Evaluation Performed:  Follow-Up Visit  Chief Complaint:  none  History of Present Illness:    William LOEBER is a 86 y.o. male with a history of coronary disease, status post CABG x4 in 1999, PAF, chronic anticoagulation on Eliquis, vascular disease, and Lewy body dementia. See Dr Allison Quarry note from 12/20/2019 for complete details of the patient's history. The patient was contacted for routine follow up.  I spoke with Ms Chuba on the phone. Since he was last seen by Dr Ellyn Hack the patient's dementia has progressed.  From a cardiac standpoint he has been stable, no chest pain or tachycardia.  His B/P has been controlled and he is tolerating his medications.  The Vascular surgery practive in [place] also follows him.   The patient does not have symptoms concerning for COVID-19 infection (fever, chills, cough, or  new shortness of breath).    Past Medical History:  Diagnosis Date  . AAA (abdominal aortic aneurysm) ([place]) 05/2019   Relativley Rapid progression from June-Aug 2020 (4.6 - 5.4 cm): s/p EVAR (Dr. Lucky Cowboy [facility]): EVAR - 23 mm prox, 12 cm distal & x 18 cm length Gore Excluder Endoprosthesis Main body (via RFA distal to lowest Renal A); 19m x 14 cm L Contralateral Limb for L Iliac - extended with 8 mm x 6cm LifeStar stent (post-dilated with 7 mm DEB).  Additional R Iliac PTA not required.   . 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 ([place] 09/15/2017   09-15-2017 DIAGNOSIS:  A. COLON POLYP X 2, ASCENDING; COLD SNARE:  - TUBULAR ADENOMA.  - NEGATIVE FOR HIGH-GRADE DYSPLASIA AND MALIGNANCY.  - NO ADDITIONAL TISSUE IDENTIFIED; FECAL MATERIAL PRESENT.   B. COLON POLYP X 2, DESCENDING; COLD SNARE:  - ADENOCARCINOMA ARISING IN A DYSPLASTIC SESSILE SERRATED ADENOMA, WITH  LYMPHOVASCULAR INVASION, SEE COMMENT.  - TUBULAR ADENOMA, 2 FRAGMENTS, NEGATIVE FO  . Arthritis   . Basal cell carcinoma of eyelid 01/03/2013   2019 - R side of Nose  . Benign neoplasm of ascending colon   . Benign neoplasm of descending colon   . Bilateral iliac artery stenosis ([place] 2007   Bilateral Iliac A stenting; extension of EVAR limbs into both Iliacs -- additional stent placed in L Iliac.  . [facility] ([facility]    Skin cancer  . Colon polyps   . Complication of anesthesia    severe confusion agitation requiring hospital admission x 4 days  . Compression fracture  of fourth lumbar vertebra ([place]) 04/2019   - s/p Kyphoplasty; T12, L3-5 [facility])  . COPD (chronic obstructive pulmonary disease) (HCC)    Centrilobular Emphysema  . Coronary artery disease involving native coronary artery without angina pectoris 1998   - s/p CABG, occluded SVG-D1; patent LIMA-LAD, SVG-OM, SVG- RCA  . Dementia ([place])    memory loss - started noting late 2019  . Falls frequently    broke foot  . Fracture 2021   left foot  . GERD (gastroesophageal reflux disease)   .  Hemorrhoid 02/10/2015  . History of hiatal hernia   . History of kidney stones   . HOH (hard of hearing)   . Hypertension   . Lewy body dementia ([place]) 04/04/2020  . Macular degeneration disease   . Osteoporosis   . PAF (paroxysmal atrial fibrillation) ([place])    on Eliquis for OAC - Rate Control   . Prostate cancer ([place])   . Prostate disorder   . Sciatic pain    Chronic  . Sleep apnea    cpap  . Temporary cerebral vascular dysfunction 02/10/2015   Had negative work-up.  July, 2012.  No medication changes, had forgot them that day, got dehydrated.   . Urinary incontinence    Past Surgical History:  Procedure Laterality Date  . ABDOMINAL AORTIC ENDOVASCULAR STENT GRAFT Bilateral 05/18/2019   Procedure: ABDOMINAL AORTIC ENDOVASCULAR STENT GRAFT;  Surgeon: Algernon Huxley, MD;  Location: [facility] ORS;  Service: Vascular;  Laterality: Bilateral;  . ANKLE SURGERY Left    ORIF   . Arch aortogram and carotid aortogram  06/02/2006   Dr Oneida Alar did surgery  . BASAL CELL CARCINOMA EXCISION  04/2016   Dermatology  . CARDIAC CATHETERIZATION  01/30/1998    (Dr. Linard [place]): Native LAD & mRCA CTO. 90% D1. Mod Cx. SVG-D1 CTO. SVG-RCA & SVG-OM along with LIMA-LAD patent;  LV NORMAL Fxn  . CAROTID ENDARTERECTOMY Left Oct. 29, 2007   Dr. Oneida Alar:  . CATARACT EXTRACTION W/PHACO Right 03/05/2017   Procedure: CATARACT EXTRACTION PHACO AND INTRAOCULAR LENS PLACEMENT (IOC);  Surgeon: Leandrew Koyanagi, MD;  Location: [facility] ORS;  Service: Ophthalmology;  Laterality: Right;  [place] 00:58.5 AP% 10.6 CDE 6.20 Fluid Pack lot # [id number]  . CATARACT EXTRACTION W/PHACO Left 04/15/2017   Procedure: CATARACT EXTRACTION PHACO AND INTRAOCULAR LENS PLACEMENT ([place])  Left;  Surgeon: Leandrew Koyanagi, MD;  Location: [facility];  Service: Ophthalmology;  Laterality: Left;  . COLONOSCOPY WITH PROPOFOL N/A 09/15/2017   Procedure: COLONOSCOPY WITH PROPOFOL;  Surgeon: Lucilla Lame, MD;  Location: [facility] ENDOSCOPY;  Service:  Endoscopy;  Laterality: N/A;  . COLONOSCOPY WITH PROPOFOL N/A 10/26/2018   Procedure: COLONOSCOPY WITH PROPOFOL;  Surgeon: Lucilla Lame, MD;  Location: [facility] ENDOSCOPY;  Service: Endoscopy;  Laterality: N/A;  . CORONARY ARTERY BYPASS GRAFT  1998   LIMA-LAD, SVG-OM, SVG-RPDA, SVG-DIAG  . CPET / MET  12/2012   Mild chronotropic incompetence - read 82% of predicted; also reduced effort; peak VO2 15.7 / 75% (did not reach Max effort) -- suggested ischemic response in last 1.5 minutes of exercise. Normal pulmonary function on PFTs but poor response to  . CYSTOSCOPY W/ RETROGRADES Left 01/13/2020   Procedure: CYSTOSCOPY WITH RETROGRADE PYELOGRAM;  Surgeon: Billey Co, MD;  Location: [facility] ORS;  Service: Urology;  Laterality: Left;  . CYSTOSCOPY WITH STENT PLACEMENT Left 12/26/2019   Procedure: CYSTOSCOPY WITH STENT PLACEMENT;  Surgeon: Billey Co, MD;  Location: [facility] ORS;  Service: Urology;  Laterality: Left;  .  CYSTOSCOPY/URETEROSCOPY/HOLMIUM LASER/STENT PLACEMENT Left 01/13/2020   Procedure: CYSTOSCOPY/URETEROSCOPY/HOLMIUM LASER/STENT EXCHANGE;  Surgeon: Billey Co, MD;  Location: [facility] ORS;  Service: Urology;  Laterality: Left;  . ENDOVASCULAR REPAIR/STENT GRAFT N/A 05/18/2019   Procedure: ENDOVASCULAR REPAIR/STENT GRAFT;  Surgeon: Algernon Huxley, MD;  Location: [facility] INVASIVE CV LAB;; EVAR - 23 mm prox, 12 cm distal & x 18 cm length Gore Excluder Endoprosthesis Main body (via RFA distal to lowest Renal A); 23m x 14 cm L Contralateral Limb for L Iliac - extended with 8 mm x 6cm LifeStar stent; no additional R Iliac PTA needed.   . ESOPHAGOGASTRODUODENOSCOPY (EGD) WITH PROPOFOL N/A 09/15/2017   Procedure: ESOPHAGOGASTRODUODENOSCOPY (EGD) WITH PROPOFOL;  Surgeon: WLucilla Lame MD;  [facility];  Service: Endoscopy;  Laterality: N/A;  . FRACTURE SURGERY Right 03/19/2015   wrist  . FRACTURE SURGERY Left   . HIATAL HERNIA REPAIR    . ILIAC ARTERY STENT  12/15/2005   PTA and direct  stenting rgt and lft common iliac arteries  . KYPHOPLASTY N/A 04/05/2019   Procedure: KYPHOPLASTY T12, L3, L4 L5;  Surgeon: MHessie Knows MD;  Location: [facility] ORS;  Service: Orthopedics;  Laterality: N/A;  . NM GATED MYOVIEW ([place])  05/'19; 8/'20   a) [facility]: Low Risk - no ischemia or infarct.  EF > 65%. no RWMA;; b) ([facility]: Normal wall motion.  No ischemia or infarction.  . OPEN REDUCTION INTERNAL FIXATION (ORIF) DISTAL RADIAL FRACTURE Left 01/13/2019   Procedure: OPEN REDUCTION INTERNAL FIXATION (ORIF) DISTAL  RADIAL FRACTURE - LEFT - SLEEP APNEA;  Surgeon: MHessie Knows MD;  Location: [facility] ORS;  Service: Orthopedics;  Laterality: Left;  . ORIF ELBOW FRACTURE Right 01/01/2017   Procedure: OPEN REDUCTION INTERNAL FIXATION (ORIF) ELBOW/OLECRANON FRACTURE;  Surgeon: MHessie Knows MD;  Location: [facility] ORS;  Service: Orthopedics;  Laterality: Right;  . ORIF WRIST FRACTURE Right 03/19/2015   Procedure: OPEN REDUCTION INTERNAL FIXATION (ORIF) WRIST FRACTURE;  Surgeon: MHessie Knows MD;  Location: [facility] ORS;  Service: Orthopedics;  Laterality: Right;  . SHOULDER ARTHROSCOPY Right   . THORACIC AORTA - CAROTID ANGIOGRAM  October 2007   Dr. 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 TRANSTHORACIC ECHOCARDIOGRAM  February 2016   [facility]: Normal LV function. Dilated left atrium.  .Domingo DimesECHOCARDIOGRAM  04/2019   [facility] EF 55-60 %.  Mild LVH.  Relatively normal valves.     Current Meds  Medication Sig  . acetaminophen (TYLENOL) 500 MG tablet Take 500-1,000 mg by mouth every 6 (six) hours as needed for mild pain or moderate pain.   . Ascorbic Acid (VITAMIN C) 1000 MG tablet Take 1,000 mg by mouth daily.  . Cyanocobalamin (VITAMIN B-12) 500 MCG SUBL Place 500 mcg under the tongue daily.   .Marland KitchenELIQUIS 5 MG TABS tablet TAKE 1 TABLET BY MOUTH TWICE DAILY  . ezetimibe (ZETIA) 10 MG  tablet TAKE 1 TABLET BY MOUTH DAILY  . finasteride (PROSCAR) 5 MG tablet TAKE ONE TABLET BY MOUTH EVERY DAY  . fluticasone (FLONASE) 50 MCG/ACT nasal spray PLACE TWO SPRAYS IN BOTH NOSRTILS EVERY DAY AS NEEDED FOR ALLERGIES  . lisinopril (ZESTRIL) 2.5 MG tablet Take 1 tablet (2.5 mg total) by mouth daily.  . Magnesium Oxide 500 MG TABS Take 500 mg by mouth every evening.  . melatonin 3 MG TABS tablet Take 0.5 tablets (1.5 mg total) by mouth at bedtime.  .[name]  metoprolol succinate (TOPROL-XL) 50 MG 24 hr tablet TAKE ONE TABLET BY MOUTH EVERY DAY TAKE WITH OR IMMEDIATELY FOLLOWING A MEAL  . Multiple Vitamins-Minerals (PRESERVISION AREDS 2 PO) Take 1 tablet by mouth 2 (two) times daily.   [name] MYRBETRIQ 50 MG TB24 tablet TAKE ONE TABLET BY MOUTH EVERY DAY  . nitroGLYCERIN (NITROSTAT) 0.4 MG SL tablet Place 1 tablet (0.4 mg total) under the tongue every 5 (five) minutes as needed for chest pain.  . pantoprazole (PROTONIX) 40 MG tablet TAKE ONE TABLET BY MOUTH EVERY DAY  . QUEtiapine (SEROQUEL) 25 MG tablet Take 12.5 mg by mouth.   . rosuvastatin (CRESTOR) 20 MG tablet TAKE ONE TABLET BY MOUTH EVERY DAY  . umeclidinium-vilanterol (ANORO ELLIPTA) 62.5-25 MCG/INH AEPB Inhale 1 puff into the lungs daily.      Allergies:   Clonazepam, Codeine, Ropinirole, Hydrocodone, Niacin and related, Oxycodone, and Tramadol   Social History   Tobacco Use  . Smoking status: Former Smoker    Types: Cigarettes    Quit date: 08/04/1997    Years since quitting: 23.0  . Smokeless tobacco: Never Used  Vaping Use  . Vaping Use: Never used  Substance Use Topics  . Alcohol use: No  . Drug use: No     Family Hx: The patient's family history includes Alcohol abuse in his father; Dementia in his mother; Ulcers in his mother.  ROS:   Please see the history of present illness.    All other systems reviewed and are negative.   Prior CV studies:   The following studies were reviewed today:    Labs/Other Tests and  Data Reviewed:    EKG:  An ECG dated 04/05/2020 was personally reviewed today and demonstrated:  NSR-HR 71, RBBB  Recent Labs: 05/14/2020: ALT 7; BUN 14; Creatinine, Ser 0.98; Potassium 4.2; Sodium 140; TSH 2.940 07/17/2020: Hemoglobin 12.0; Platelets 138   Recent Lipid Panel Lab Results  Component Value Date/Time   CHOL 119 12/24/2019 07:34 AM   CHOL 110 10/14/2018 08:13 AM   TRIG 90 12/24/2019 07:34 AM   HDL 39 (L) 12/24/2019 07:34 AM   HDL 35 (L) 10/14/2018 08:13 AM   CHOLHDL 3.1 12/24/2019 07:34 AM   LDLCALC 62 12/24/2019 07:34 AM   LDLCALC 44 10/14/2018 08:13 AM    Wt Readings from Last 3 Encounters:  08/01/20 172 lb (78 kg)  07/18/20 174 lb 12.8 oz (79.3 kg)  07/17/20 177 lb 6.4 oz (80.5 kg)     Risk Assessment/Calculations:     CHA2DS2-VASc Score =   4 This indicates a  % annual risk of stroke. The patient's score is based upon:   Age, HTN, vascular disease    Objective:    Vital Signs:  BP (!) 125/99   Pulse 78   Ht '5\' 10"'  (1.778 m)   Wt 172 lb (78 kg)   BMI 24.68 kg/m    VITAL SIGNS:  reviewed  ASSESSMENT & PLAN:    PAF- No symptoms, vitals stable, he is on Eliquis  CAD- H/O CABG- subsequent low risk nuclear stress tests, no angina history  PVD- S/P EAVR Sept 2020- followed in [place]  Dementia- Followed by Neurology in [facility]- No change in current cardiac Rx-  Virtual f/u with Dr Ellyn Hack in 6 months.    Shared Decision Making/Informed Consent        COVID-19 Education: The signs and symptoms of COVID-19 were discussed with the patient and how to seek care for testing (follow  up with PCP or arrange E-visit).  The importance of social distancing was discussed today.  Time:   Today, I have spent 15 minutes with the patient with telehealth technology discussing the above problems.     Medication Adjustments/Labs and Tests Ordered: Current medicines are reviewed at length with the patient today.  Concerns regarding medicines  are outlined above.   Tests Ordered: No orders of the defined types were placed in this encounter.   Medication Changes: No orders of the defined types were placed in this encounter.   Follow Up:  Virtual Visit  Dr Ellyn Hack  Signed, Kerin Ransom, PA-C  08/01/2020 9:50 AM    [name]

## 2020-08-01 NOTE — Patient Instructions (Signed)
Medication Instructions:  Continue current medications  *If you need a refill on your cardiac medications before your next appointment, please call your pharmacy*   Lab Work: None Ordered   Testing/Procedures: None Ordered   Follow-Up: At [facility], you and your health needs are our priority.  As part of our continuing mission to provide you with exceptional heart care, we have created designated Provider Care Teams.  These Care Teams include your primary Cardiologist (physician) and Advanced Practice Providers (APPs -  Physician Assistants and Nurse Practitioners) who all work together to provide you with the care you need, when you need it.  We recommend signing up for the patient portal called "MyChart".  Sign up information is provided on this After Visit Summary.  MyChart is used to connect with patients for Virtual Visits (Telemedicine).  Patients are able to view lab/test results, encounter notes, upcoming appointments, etc.  Non-urgent messages can be sent to your provider as well.   To learn more about what you can do with MyChart, go to [email or web address].    Your next appointment:   6 month(s)  The format for your next appointment:   Virtual Visit   Provider:   You may see David Harding, MD or one of the following Advanced Practice Providers on your designated Care Team:    Rhonda Barrett, PA-C  Kathryn Lawrence, DNP, ANP     

## 2020-08-02 ENCOUNTER — Other Ambulatory Visit: Payer: Self-pay

## 2020-08-02 ENCOUNTER — Ambulatory Visit: Payer: Medicare Other | Attending: Nurse Practitioner

## 2020-08-02 DIAGNOSIS — M545 Low back pain, unspecified: Secondary | ICD-10-CM | POA: Diagnosis present

## 2020-08-02 DIAGNOSIS — G8929 Other chronic pain: Secondary | ICD-10-CM | POA: Insufficient documentation

## 2020-08-02 DIAGNOSIS — R262 Difficulty in walking, not elsewhere classified: Secondary | ICD-10-CM | POA: Insufficient documentation

## 2020-08-02 DIAGNOSIS — M6281 Muscle weakness (generalized): Secondary | ICD-10-CM | POA: Insufficient documentation

## 2020-08-02 DIAGNOSIS — R2689 Other abnormalities of gait and mobility: Secondary | ICD-10-CM | POA: Diagnosis present

## 2020-08-02 DIAGNOSIS — R2681 Unsteadiness on feet: Secondary | ICD-10-CM | POA: Diagnosis present

## 2020-08-02 NOTE — Therapy (Signed)
[place] MAIN [facility] SERVICES [address] [place], [place], [address] Phone: [phone]   Fax:  [phone]  Physical Therapy Treatment  Patient Details  Name: [name] MRN: [id number] Date of Birth: 01/27/1934 Referring Provider (PT): [name]    Encounter Date: 08/02/2020   PT End of Session - 08/02/20 1344    Visit Number 5    Number of Visits 24    Date for PT Re-Evaluation 10/04/20    Authorization Type 5/10 eval 07/12/20    PT Start Time 0857    PT Stop Time 0930    PT Time Calculation (min) 33 min    Equipment Utilized During Treatment Gait belt    Activity Tolerance Patient tolerated treatment well;Patient limited by fatigue    Behavior During Therapy [facility] for tasks assessed/performed           Past Medical History:  Diagnosis Date  . AAA (abdominal aortic aneurysm) ([name]) 05/2019   Relativley Rapid progression from June-Aug 2020 (4.6 - 5.4 cm): s/p EVAR (William. Lucky [facility]): EVAR - 23 mm prox, 12 cm distal & x 18 cm length Gore Excluder Endoprosthesis Main body (via RFA distal to lowest Renal A); 61m x 14 cm L Contralateral Limb for L Iliac - extended with 8 mm x 6cm LifeStar stent (post-dilated with 7 mm DEB).  Additional R Iliac PTA not required.   . 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 ([place] 09/15/2017   09-15-2017 DIAGNOSIS:  A. COLON POLYP X 2, ASCENDING; COLD SNARE:  - TUBULAR ADENOMA.  - NEGATIVE FOR HIGH-GRADE DYSPLASIA AND MALIGNANCY.  - NO ADDITIONAL TISSUE IDENTIFIED; FECAL MATERIAL PRESENT.   B. COLON POLYP X 2, DESCENDING; COLD SNARE:  - ADENOCARCINOMA ARISING IN A DYSPLASTIC SESSILE SERRATED ADENOMA, WITH  LYMPHOVASCULAR INVASION, SEE COMMENT.  - TUBULAR ADENOMA, 2 FRAGMENTS, NEGATIVE FO  . Arthritis   . Basal cell carcinoma of eyelid 01/03/2013   2019 - R side of Nose  . Benign neoplasm of ascending colon   . Benign neoplasm of descending colon   . Bilateral iliac artery stenosis ([place] 2007   Bilateral Iliac  A stenting; extension of EVAR limbs into both Iliacs -- additional stent placed in L Iliac.  . Cancer ([place]    Skin cancer  . Colon polyps   . Complication of anesthesia    severe confusion agitation requiring hospital admission x 4 days  . Compression fracture of fourth lumbar vertebra ([place] 04/2019   - s/p Kyphoplasty; T12, L3-5 ([facility]  . COPD (chronic obstructive pulmonary disease) (HCC)    Centrilobular Emphysema  . Coronary artery disease involving native coronary artery without angina pectoris 1998   - s/p CABG, occluded SVG-D1; patent LIMA-LAD, SVG-OM, SVG- RCA  . Dementia ([place]    memory loss - started noting late 2019  . Falls frequently    broke foot  . Fracture 2021   left foot  . GERD (gastroesophageal reflux disease)   . Hemorrhoid 02/10/2015  . History of hiatal hernia   . History of kidney stones   . HOH (hard of hearing)   . Hypertension   . Lewy body dementia ([place] 04/04/2020  . Macular degeneration disease   . Osteoporosis   . PAF (paroxysmal atrial fibrillation) ([place]    on Eliquis for OAC - Rate Control   . Prostate cancer ([place]   . Prostate disorder   . Sciatic pain    Chronic  . Sleep apnea  cpap  . Temporary cerebral vascular dysfunction 02/10/2015   Had negative work-up.  July, 2012.  No medication changes, had forgot them that day, got dehydrated.   . Urinary incontinence     Past Surgical History:  Procedure Laterality Date  . ABDOMINAL AORTIC ENDOVASCULAR STENT GRAFT Bilateral 05/18/2019   Procedure: ABDOMINAL AORTIC ENDOVASCULAR STENT GRAFT;  Surgeon: William Huxley, MD;  Location: [facility] ORS;  Service: Vascular;  Laterality: Bilateral;  . ANKLE SURGERY Left    ORIF   . Arch aortogram and carotid aortogram  06/02/2006   [name] did surgery  . BASAL CELL CARCINOMA EXCISION  04/2016   Dermatology  . CARDIAC CATHETERIZATION  01/30/1998    (William. Linard Foster): Native LAD & mRCA CTO. 90% D1. Mod Cx. SVG-D1 CTO. SVG-RCA & SVG-OM along with LIMA-LAD  patent;  LV NORMAL Fxn  . CAROTID ENDARTERECTOMY Left Oct. 29, 2007   William. [place] Foster:  . CATARACT EXTRACTION W/PHACO Right 03/05/2017   Procedure: CATARACT EXTRACTION PHACO AND INTRAOCULAR LENS PLACEMENT (IOC);  Surgeon: William Koyanagi, MD;  Location: [facility] ORS;  Service: Ophthalmology;  Laterality: Right;  [place] 00:58.5 AP% 10.6 CDE 6.20 Fluid Pack lot # [id number]  . CATARACT EXTRACTION W/PHACO Left 04/15/2017   Procedure: CATARACT EXTRACTION PHACO AND INTRAOCULAR LENS PLACEMENT ([name])  Left;  Surgeon: William Koyanagi, MD;  Location: [place];  Service: Ophthalmology;  Laterality: Left;  . COLONOSCOPY WITH PROPOFOL N/A 09/15/2017   Procedure: COLONOSCOPY WITH PROPOFOL;  Surgeon: William Lame, MD;  Location: [facility] ENDOSCOPY;  Service: Endoscopy;  Laterality: N/A;  . COLONOSCOPY WITH PROPOFOL N/A 10/26/2018   Procedure: COLONOSCOPY WITH PROPOFOL;  Surgeon: William Lame, MD;  [facility];  Service: Endoscopy;  Laterality: N/A;  . CORONARY ARTERY BYPASS GRAFT  1998   LIMA-LAD, SVG-OM, SVG-RPDA, SVG-DIAG  . CPET / MET  12/2012   Mild chronotropic incompetence - read 82% of predicted; also reduced effort; peak VO2 15.7 / 75% (did not reach Max effort) -- suggested ischemic response in last 1.5 minutes of exercise. Normal pulmonary function on PFTs but poor response to  . CYSTOSCOPY W/ RETROGRADES Left 01/13/2020   Procedure: CYSTOSCOPY WITH RETROGRADE PYELOGRAM;  Surgeon: William Co, MD;  Location: [facility] ORS;  Service: Urology;  Laterality: Left;  . CYSTOSCOPY WITH STENT PLACEMENT Left 12/26/2019   Procedure: CYSTOSCOPY WITH STENT PLACEMENT;  Surgeon: William Co, MD;  Location: [facility] ORS;  Service: Urology;  Laterality: Left;  . CYSTOSCOPY/URETEROSCOPY/HOLMIUM LASER/STENT PLACEMENT Left 01/13/2020   Procedure: CYSTOSCOPY/URETEROSCOPY/HOLMIUM LASER/STENT EXCHANGE;  Surgeon: William Co, MD;  Location: [facility] ORS;  Service: Urology;  Laterality: Left;  . ENDOVASCULAR  REPAIR/STENT GRAFT N/A 05/18/2019   Procedure: ENDOVASCULAR REPAIR/STENT GRAFT;  Surgeon: William Huxley, MD;  Location: [facility] INVASIVE CV LAB;; EVAR - 23 mm prox, 12 cm distal & x 18 cm length Gore Excluder Endoprosthesis Main body (via RFA distal to lowest Renal A); 70m x 14 cm L Contralateral Limb for L Iliac - extended with 8 mm x 6cm LifeStar stent; no additional R Iliac PTA needed.   . ESOPHAGOGASTRODUODENOSCOPY (EGD) WITH PROPOFOL N/A 09/15/2017   Procedure: ESOPHAGOGASTRODUODENOSCOPY (EGD) WITH PROPOFOL;  Surgeon: WLucilla Lame MD;  [facility];  Service: Endoscopy;  Laterality: N/A;  . FRACTURE SURGERY Right 03/19/2015   wrist  . FRACTURE SURGERY Left   . HIATAL HERNIA REPAIR    . ILIAC ARTERY STENT  12/15/2005   PTA and direct stenting rgt and lft common iliac arteries  . KYPHOPLASTY  N/A 04/05/2019   Procedure: KYPHOPLASTY T12, L3, L4 L5;  Surgeon: Hessie Knows, MD;  Location: [facility] ORS;  Service: Orthopedics;  Laterality: N/A;  . NM GATED MYOVIEW ([place] HX)  05/'19; 8/'20   a) [facility]: Low Risk - no ischemia or infarct.  EF > 65%. no RWMA;; b) [facility]): Normal wall motion.  No ischemia or infarction.  . OPEN REDUCTION INTERNAL FIXATION (ORIF) DISTAL RADIAL FRACTURE Left 01/13/2019   Procedure: OPEN REDUCTION INTERNAL FIXATION (ORIF) DISTAL  RADIAL FRACTURE - LEFT - SLEEP APNEA;  Surgeon: Hessie Knows, MD;  Location: [facility] ORS;  Service: Orthopedics;  Laterality: Left;  . ORIF ELBOW FRACTURE Right 01/01/2017   Procedure: OPEN REDUCTION INTERNAL FIXATION (ORIF) ELBOW/OLECRANON FRACTURE;  Surgeon: Hessie Knows, MD;  Location: [facility] ORS;  Service: Orthopedics;  Laterality: Right;  . ORIF WRIST FRACTURE Right 03/19/2015   Procedure: OPEN REDUCTION INTERNAL FIXATION (ORIF) WRIST FRACTURE;  Surgeon: Hessie Knows, MD;  Location: [facility] ORS;  Service: Orthopedics;  Laterality: Right;  . SHOULDER ARTHROSCOPY Right   . THORACIC AORTA - CAROTID ANGIOGRAM  October 2007   William. 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 TRANSTHORACIC ECHOCARDIOGRAM  February 2016   [facility]: Normal LV function. Dilated left atrium.  Domingo Dimes ECHOCARDIOGRAM  04/2019   [facility] April 20, 2019): EF 55-60 %.  Mild LVH.  Relatively normal valves.    There were no vitals filed for this visit.   Subjective Assessment - 08/02/20 1342    Subjective Patient is late due to new caregiver and mixup on schedule. Patient is confused today. No falls per patient however patient is not the best historian today.    Patient is accompained by: --   aide   Pertinent History Patient is returning to PT for evaluation of balance. Patient has been seen by this therapist in the past. PMH AAA, arthritis, basal cell carcinoma of eyelid, benign neoplasm of ascending/descending colon, bilateral iliac artery stenosis, colon polyps, compression fx of 4th lumbar vertebra (s/p kyphoplasty T12, L3-5), COPD, CAD, dementia, falls,  GERD, hemorroids, HOH, HTN, Lewy Body Dementia, macular degeneration disease, osteoporosis, PAF, prostate cancer, prostate disorder, sciatic pain, sleep apnea, temporary cerebral vascular dysfunction. Comes with aide, having multiple near falls per day per aide, mainly to the sides.    Limitations Walking;Standing;House hold activities;Other (comment);Lifting;Sitting    How long can you sit comfortably? n/a back pain no longer limiting    How long can you stand comfortably? 10-12 with walker    How long can you walk comfortably? walks 15 minutes with a walker    Patient Stated Goals walk without stumbling, be able to be steady enough to not have to have someone holding his gait belt.    Currently in Pain? No/denies               BP : 155/75  Standing: 6" step -toe taps 15x each LE, BUE support  Lateral bosu lunge with BUE support, modified position 10x each side  Standing with # 5 ankle weight: CGA for  stability  -Hip extension with BUE upper extremity support, cueing for neutral hip alignment, upright posture for optimal muscle recruitment, and sequencing, 10x each LE,  -Hip abduction with BUE upper extremity support, cueing for neutral foot alignment for correct muscle activation, 10x each LE -Hip flexion with BUE upper extremity support, cueing for body mechanics, speed of muscle recruitment for optimal strengthening and stabilization  10x each LE -Hamstring curl with BUE upper extremity support, cueing for knee alignment for recruitment of hamstring musculature, 10x each LE  Seated:  Seated with # 5lb  ankle weights  -Seated marches with upright posture, back away from back of chair for abdominal/trunk activation/stabilization, 10x each LE -Seated LAQ with 3 second holds, 10x each LE, cueing for muscle activation and sequencing for neutral alignment -Seated IR/ER with cueing for stabilizing knee placement with lateral foot movement for optimal muscle recruitment, 10x each LE  5x STS without UE support, very challenging with eccentric control.   seated balloon taps reaching inside/outside BOS x 3 minutes Seated RTB row 15x    Pt educated throughout session about proper posture and technique with exercises. Improved exercise technique, movement at target joints, use of target muscles after min to mod verbal, visual, tactile cues.    Patient session limited by late arrival. Patient has new caretaker and was confused on the times. Patient more confused this session requiring cueing for task orientation and visual cueing/demonstration. Patient will benefit from skilled physical therapy to increase strength, balance, and mobility to allow patient to reduce fall risk and increase independence in daily life.                 PT Education - 08/02/20 1343    Education provided Yes    Education Details exercies technique, body mechanics    Person(s) Educated Patient    Methods  Explanation;Demonstration;Tactile cues;Verbal cues    Comprehension Verbalized understanding;Returned demonstration;Verbal cues required;Tactile cues required            PT Short Term Goals - 07/12/20 0959      PT SHORT TERM GOAL #1   Title Patient will be independent in home exercise program to improve strength/mobility for better functional independence with ADLs.    Baseline 11/11: HEP given    Time 4    Period Weeks    Status New    Target Date 08/09/20             PT Long Term Goals - 07/12/20 0959      PT LONG TERM GOAL #1   Title Patient will increase FOTO score to equal to or greater than 51% to demonstrate statistically significant improvement in mobility and quality of life.    Baseline 11/11: 42%    Time 12    Period Weeks    Status New    Target Date 10/04/20      PT LONG TERM GOAL #2   Title Patient will demonstrate an improved Berg Balance Score of > 40/56 as to demonstrate improved balance with ADLs such as sitting/standing and transfer balance and reduced fall risk.    Baseline 11/11: 34/56    Time 12    Period Weeks    Status New    Target Date 10/04/20      PT LONG TERM GOAL #3   Title Patient (> 75 years old) will complete five times sit to stand test in < 15 seconds without UE support indicating an increased LE strength and improved balance    Baseline 11/11; 23.35 seconds with UE support    Time 12    Period Weeks    Status New    Target Date 10/04/20      PT LONG TERM GOAL #4   Title Patient will increase 10 meter walk test to >1.76ms with LRAD  as to improve gait speed for better community ambulation and to reduce  fall risk.    Baseline 11/11: 0.74 m/s without AD with one near LOB    Time 12    Period Weeks    Status New    Target Date 10/04/20      PT LONG TERM GOAL #5   Title Patient will increase BLE gross strength to 4+/5 as to improve functional strength for independent gait, increased standing tolerance and increased ADL ability.     Baseline 11/11: see note    Time 12    Period Weeks    Status New    Target Date 10/04/20                 Plan - 08/02/20 1346    Clinical Impression Statement Patient session limited by late arrival. Patient has new caretaker and was confused on the times. Patient more confused this session requiring cueing for task orientation and visual cueing/demonstration. Patient will benefit from skilled physical therapy to increase strength, balance, and mobility to allow patient to reduce fall risk and increase independence in daily life.    Personal Factors and Comorbidities Age;Comorbidity 3+;Fitness;Past/Current Experience;Sex;Social Background;Time since onset of injury/illness/exacerbation;Transportation    Comorbidities AAA, arthritis, atherosclerotic heart disease, (s/p CABG), benign neoplasm of ascending/descending colon, COPD, CAD, dementia, dysrhythmia, frequent falls, GERD, hemorrhoid, HOH, HTN, PAF, prostate cancer.    Examination-Activity Limitations Bathing;Bend;Carry;Dressing;Continence;Stairs;Squat;Reach Overhead;Locomotion Level;Lift;Stand;Transfers;Toileting;Bed Mobility    Examination-Participation Restrictions Church;Cleaning;Community Activity;Driving;Interpersonal Relationship;Laundry;Volunteer;Shop;Personal Finances;Meal Prep;Yard Work    [employer] Evolving/Moderate complexity    Rehab Potential Fair    PT Frequency 2x / week    PT Duration 12 weeks    PT Treatment/Interventions ADLs/Self Care Home Management;Aquatic Therapy;Cryotherapy;Moist Heat;Traction;Ultrasound;Therapeutic activities;Functional mobility training;Stair training;Gait training;DME Instruction;Therapeutic exercise;Balance training;Neuromuscular re-education;Patient/family education;Manual techniques;Passive range of motion;Energy conservation;Vestibular;Taping;Biofeedback;Electrical Stimulation;Iontophoresis 12m/ml Dexamethasone;Dry needling;Visual/perceptual  remediation/compensation;Cognitive remediation;Canalith Repostioning    PT Next Visit Plan balance, strength    PT Home Exercise Plan see above    Consulted and Agree with Plan of Care Patient;Family member/caregiver    Family Member Consulted aide           Patient will benefit from skilled therapeutic intervention in order to improve the following deficits and impairments:  Abnormal gait, Cardiopulmonary status limiting activity, Decreased activity tolerance, Decreased balance, Decreased knowledge of precautions, Decreased endurance, Decreased coordination, Decreased cognition, Decreased knowledge of use of DME, Decreased mobility, Difficulty walking, Decreased safety awareness, Decreased strength, Impaired flexibility, Impaired perceived functional ability, Impaired sensation, Postural dysfunction, Improper body mechanics, Pain, Impaired vision/preception, Decreased range of motion  Visit Diagnosis: Unsteadiness on feet  Muscle weakness (generalized)  Other abnormalities of gait and mobility     Problem List Patient Active Problem List   Diagnosis Date Noted  . AAA (abdominal aortic aneurysm) without rupture ([place] 06/29/2020  . LBD (Lewy body dementia) ([place] 05/14/2020  . Numbness and tingling in left hand 03/02/2020  . Hematuria   . Acute kidney injury superimposed on CKD ([place]   . Sepsis due to gram-negative UTI ([place] 12/26/2019  . Left nephrolithiasis 12/26/2019  . Hydronephrosis, left 12/26/2019  . Status post abdominal aortic aneurysm (AAA) repair 04/15/2019  . Do not intubate, cardiopulmonary resuscitation (CPR)-only code status 10/08/2018  . Prostate cancer ([place] 02/25/2018  . Prostatic cyst 01/15/2018  . Blood in stool   . Abnormal feces   . Stricture and stenosis of esophagus   . Mild cognitive impairment 08/18/2017  . Senile purpura ([place] 04/29/2017  . Anemia 04/29/2017  . Frequent falls 04/29/2017  . Simple chronic bronchitis ([place] 12/17/2016  .  Benign  localized hyperplasia of prostate with urinary obstruction 07/15/2016  . Elevated PSA 07/15/2016  . Incomplete emptying of bladder 07/15/2016  . Urge incontinence 07/15/2016  . Hypothyroidism 04/25/2016  . Macular degeneration 04/25/2016  . Erectile dysfunction due to arterial insufficiency   . Ischemic heart disease with chronotropic incompetence   . CN (constipation) 02/10/2015  . DD (diverticular disease) 02/10/2015  . Weakness 02/10/2015  . Lichen planus 41/36/4383  . Restless leg 02/10/2015  . 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 B12 deficiency 02/10/2015  . COPD (chronic obstructive pulmonary disease) ([place]) 11/14/2014  . Post Inflammatory Lung Changes 11/14/2014  . PAF (paroxysmal atrial fibrillation) (HCC) CHA2DS2-VASc = 4. AC = Eliquis   . Insomnia 12/09/2013  . OSA on CPAP   . Dyspnea on exertion - -essentially resolved with BB dose reduction & wgt loss 03/29/2013  . Overweight (BMI 25.0-29.9) -- Wgt back up 01/17/2013  . Hx of CABG   . Essential hypertension   . Hyperlipidemia with target LDL less than 70   . Aorto-iliac disease ([place])   . BCC (basal cell carcinoma), eyelid 01/03/2013  . Allergic rhinitis 08/17/2009  . Adaptation reaction 05/28/2009  . Acid reflux 05/28/2009  . Arthritis, degenerative 05/28/2009  . 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 Carotid artery occlusion without infarction 06/01/2006  . PAD (peripheral artery disease) - bilateral common iliac stents 12/15/2005  . Atherosclerotic heart disease of artery bypass graft 09/01/1997   Janna Arch, PT, DPT   08/02/2020, 1:48 PM  [place] MAIN [facility] SERVICES [address] [place], [place], [address] Phone: [phone]   Fax:  [phone]  Name: [name] MRN: [id number] Date of Birth: 1933/11/06

## 2020-08-06 ENCOUNTER — Other Ambulatory Visit: Payer: Self-pay | Admitting: Family Medicine

## 2020-08-06 DIAGNOSIS — J301 Allergic rhinitis due to pollen: Secondary | ICD-10-CM

## 2020-08-06 NOTE — Telephone Encounter (Signed)
  Requested medications are on the active medication list yes  Last refill 10/15  Last visit Not within one year  Future visit scheduled no  Notes to clinic Dr Caryn Section wrote rx 10/15 for 16g with 4 refills. Unsure why needing new rx already, please assess.

## 2020-08-07 ENCOUNTER — Ambulatory Visit: Payer: Medicare Other

## 2020-08-07 ENCOUNTER — Other Ambulatory Visit: Payer: Self-pay

## 2020-08-07 ENCOUNTER — Encounter: Payer: Self-pay | Admitting: Physical Therapy

## 2020-08-07 DIAGNOSIS — G8929 Other chronic pain: Secondary | ICD-10-CM

## 2020-08-07 DIAGNOSIS — R2681 Unsteadiness on feet: Secondary | ICD-10-CM

## 2020-08-07 DIAGNOSIS — R262 Difficulty in walking, not elsewhere classified: Secondary | ICD-10-CM

## 2020-08-07 DIAGNOSIS — R2689 Other abnormalities of gait and mobility: Secondary | ICD-10-CM

## 2020-08-07 DIAGNOSIS — M6281 Muscle weakness (generalized): Secondary | ICD-10-CM

## 2020-08-07 DIAGNOSIS — M545 Low back pain, unspecified: Secondary | ICD-10-CM

## 2020-08-07 NOTE — Therapy (Signed)
[place] MAIN [facility] SERVICES [address] [place], [place], [address] Phone: [phone]   Fax:  [phone]  Physical Therapy Treatment  Patient Details  Name: [name] MRN: [id number] Date of Birth: 09-01-1934 Referring Provider (PT): Jennings Books K    Encounter Date: 08/07/2020   PT End of Session - 08/07/20 0843    Visit Number 6    Number of Visits 24    Date for PT Re-Evaluation 10/04/20    Authorization Type 5/10 eval 07/12/20    PT Start Time 0845    PT Stop Time 0930    PT Time Calculation (min) 45 min    Equipment Utilized During Treatment Gait belt    Activity Tolerance Patient tolerated treatment well;Patient limited by fatigue    Behavior During Therapy [facility] for tasks assessed/performed           Past Medical History:  Diagnosis Date  . AAA (abdominal aortic aneurysm) ([place]) 05/2019   [place] Rapid progression from June-Aug 2020 (4.6 - 5.4 cm): s/p EVAR (Dr. Lucky Cowboy [facility]): EVAR - 23 mm prox, 12 cm distal & x 18 cm length Gore Excluder Endoprosthesis Main body (via RFA distal to lowest Renal A); 78mm x 14 cm L Contralateral Limb for L Iliac - extended with 8 mm x 6cm LifeStar stent (post-dilated with 7 mm DEB).  Additional R Iliac PTA not required.   . 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 ([place]) 09/15/2017   09-15-2017 DIAGNOSIS:  A. COLON POLYP X 2, ASCENDING; COLD SNARE:  - TUBULAR ADENOMA.  - NEGATIVE FOR HIGH-GRADE DYSPLASIA AND MALIGNANCY.  - NO ADDITIONAL TISSUE IDENTIFIED; FECAL MATERIAL PRESENT.   B. COLON POLYP X 2, DESCENDING; COLD SNARE:  - ADENOCARCINOMA ARISING IN A DYSPLASTIC SESSILE SERRATED ADENOMA, WITH  LYMPHOVASCULAR INVASION, SEE COMMENT.  - TUBULAR ADENOMA, 2 FRAGMENTS, NEGATIVE FO  . Arthritis   . Basal cell carcinoma of eyelid 01/03/2013   2019 - R side of Nose  . Benign neoplasm of ascending colon   . Benign neoplasm of descending colon   . Bilateral iliac artery stenosis ([place]) 2007   Bilateral Iliac  A stenting; extension of EVAR limbs into both Iliacs -- additional stent placed in L Iliac.  . Cancer ([place])    Skin cancer  . Colon polyps   . Complication of anesthesia    severe confusion agitation requiring hospital admission x 4 days  . Compression fracture of fourth lumbar vertebra ([place]) 04/2019   - s/p Kyphoplasty; T12, L3-5 [facility])  . COPD (chronic obstructive pulmonary disease) (HCC)    Centrilobular Emphysema  . Coronary artery disease involving native coronary artery without angina pectoris 1998   - s/p CABG, occluded SVG-D1; patent LIMA-LAD, SVG-OM, SVG- RCA  . Dementia ([place])    memory loss - started noting late 2019  . Falls frequently    broke foot  . Fracture 2021   left foot  . GERD (gastroesophageal reflux disease)   . Hemorrhoid 02/10/2015  . History of hiatal hernia   . History of kidney stones   . HOH (hard of hearing)   . Hypertension   . Lewy body dementia ([place]) 04/04/2020  . Macular degeneration disease   . Osteoporosis   . PAF (paroxysmal atrial fibrillation) ([place])    on Eliquis for OAC - Rate Control   . Prostate cancer ([place])   . Prostate disorder   . Sciatic pain    Chronic  . Sleep apnea  cpap  . Temporary cerebral vascular dysfunction 02/10/2015   Had negative work-up.  July, 2012.  No medication changes, had forgot them that day, got dehydrated.   . Urinary incontinence     Past Surgical History:  Procedure Laterality Date  . ABDOMINAL AORTIC ENDOVASCULAR STENT GRAFT Bilateral 05/18/2019   Procedure: ABDOMINAL AORTIC ENDOVASCULAR STENT GRAFT;  Surgeon: Algernon Huxley, MD;  Location: [facility] ORS;  Service: Vascular;  Laterality: Bilateral;  . ANKLE SURGERY Left    ORIF   . Arch aortogram and carotid aortogram  06/02/2006   Dr Oneida Alar did surgery  . BASAL CELL CARCINOMA EXCISION  04/2016   Dermatology  . CARDIAC CATHETERIZATION  01/30/1998    (Dr. Linard Millers): Native LAD & mRCA CTO. 90% D1. Mod Cx. SVG-D1 CTO. SVG-RCA & SVG-OM along with LIMA-LAD  patent;  LV NORMAL Fxn  . CAROTID ENDARTERECTOMY Left Oct. 29, 2007   Dr. Oneida Alar:  . CATARACT EXTRACTION W/PHACO Right 03/05/2017   Procedure: CATARACT EXTRACTION PHACO AND INTRAOCULAR LENS PLACEMENT (IOC);  Surgeon: Leandrew Koyanagi, MD;  Location: [facility] ORS;  Service: Ophthalmology;  Laterality: Right;  [place] 00:58.5 AP% 10.6 CDE 6.20 Fluid Pack lot # [id number]  . CATARACT EXTRACTION W/PHACO Left 04/15/2017   Procedure: CATARACT EXTRACTION PHACO AND INTRAOCULAR LENS PLACEMENT ([place])  Left;  Surgeon: Leandrew Koyanagi, MD;  Location: [place];  Service: Ophthalmology;  Laterality: Left;  . COLONOSCOPY WITH PROPOFOL N/A 09/15/2017   Procedure: COLONOSCOPY WITH PROPOFOL;  Surgeon: Lucilla Lame, MD;  Location: [facility] ENDOSCOPY;  Service: Endoscopy;  Laterality: N/A;  . COLONOSCOPY WITH PROPOFOL N/A 10/26/2018   Procedure: COLONOSCOPY WITH PROPOFOL;  Surgeon: Lucilla Lame, MD;  Location[facility];  Service: Endoscopy;  Laterality: N/A;  . CORONARY ARTERY BYPASS GRAFT  1998   LIMA-LAD, SVG-OM, SVG-RPDA, SVG-DIAG  . CPET / MET  12/2012   Mild chronotropic incompetence - read 82% of predicted; also reduced effort; peak VO2 15.7 / 75% (did not reach Max effort) -- suggested ischemic response in last 1.5 minutes of exercise. Normal pulmonary function on PFTs but poor response to  . CYSTOSCOPY W/ RETROGRADES Left 01/13/2020   Procedure: CYSTOSCOPY WITH RETROGRADE PYELOGRAM;  Surgeon: Billey Co, MD;  Location: [facility] ORS;  Service: Urology;  Laterality: Left;  . CYSTOSCOPY WITH STENT PLACEMENT Left 12/26/2019   Procedure: CYSTOSCOPY WITH STENT PLACEMENT;  Surgeon: Billey Co, MD;  Location: [facility] ORS;  Service: Urology;  Laterality: Left;  . CYSTOSCOPY/URETEROSCOPY/HOLMIUM LASER/STENT PLACEMENT Left 01/13/2020   Procedure: CYSTOSCOPY/URETEROSCOPY/HOLMIUM LASER/STENT EXCHANGE;  Surgeon: Billey Co, MD;  Location: [facility] ORS;  Service: Urology;  Laterality: Left;  . ENDOVASCULAR  REPAIR/STENT GRAFT N/A 05/18/2019   Procedure: ENDOVASCULAR REPAIR/STENT GRAFT;  Surgeon: Algernon Huxley, MD;  Location: [facility] INVASIVE CV LAB;; EVAR - 23 mm prox, 12 cm distal & x 18 cm length Gore Excluder Endoprosthesis Main body (via RFA distal to lowest Renal A); 70m x 14 cm L Contralateral Limb for L Iliac - extended with 8 mm x 6cm LifeStar stent; no additional R Iliac PTA needed.   . ESOPHAGOGASTRODUODENOSCOPY (EGD) WITH PROPOFOL N/A 09/15/2017   Procedure: ESOPHAGOGASTRODUODENOSCOPY (EGD) WITH PROPOFOL;  Surgeon: WLucilla Lame MD;  [facility];  Service: Endoscopy;  Laterality: N/A;  . FRACTURE SURGERY Right 03/19/2015   wrist  . FRACTURE SURGERY Left   . HIATAL HERNIA REPAIR    . ILIAC ARTERY STENT  12/15/2005   PTA and direct stenting rgt and lft common iliac arteries  . KYPHOPLASTY  N/A 04/05/2019   Procedure: KYPHOPLASTY T12, L3, L4 L5;  Surgeon: Hessie Knows, MD;  Location: [facility] ORS;  Service: Orthopedics;  Laterality: N/A;  . NM GATED MYOVIEW ([place] HX)  05/'19; 8/'20   a) [facility]: Low Risk - no ischemia or infarct.  EF > 65%. no RWMA;; b) [facility]): Normal wall motion.  No ischemia or infarction.  . OPEN REDUCTION INTERNAL FIXATION (ORIF) DISTAL RADIAL FRACTURE Left 01/13/2019   Procedure: OPEN REDUCTION INTERNAL FIXATION (ORIF) DISTAL  RADIAL FRACTURE - LEFT - SLEEP APNEA;  Surgeon: Hessie Knows, MD;  Location: [facility] ORS;  Service: Orthopedics;  Laterality: Left;  . ORIF ELBOW FRACTURE Right 01/01/2017   Procedure: OPEN REDUCTION INTERNAL FIXATION (ORIF) ELBOW/OLECRANON FRACTURE;  Surgeon: Hessie Knows, MD;  Location: [facility] ORS;  Service: Orthopedics;  Laterality: Right;  . ORIF WRIST FRACTURE Right 03/19/2015   Procedure: OPEN REDUCTION INTERNAL FIXATION (ORIF) WRIST FRACTURE;  Surgeon: Hessie Knows, MD;  Location: [facility] ORS;  Service: Orthopedics;  Laterality: Right;  . SHOULDER ARTHROSCOPY Right   . THORACIC AORTA - CAROTID ANGIOGRAM  October 2007   Dr. 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 TRANSTHORACIC ECHOCARDIOGRAM  February 2016   [facility]: Normal LV function. Dilated left atrium.  Domingo Dimes ECHOCARDIOGRAM  04/2019   [facility] April 20, 2019): EF 55-60 %.  Mild LVH.  Relatively normal valves.    There were no vitals filed for this visit.   Subjective Assessment - 08/07/20 0920    Subjective Patient reported that he is doing well today, no pain, no falls to report.    Pertinent History Patient is returning to PT for evaluation of balance. Patient has been seen by this therapist in the past. PMH AAA, arthritis, basal cell carcinoma of eyelid, benign neoplasm of ascending/descending colon, bilateral iliac artery stenosis, colon polyps, compression fx of 4th lumbar vertebra (s/p kyphoplasty T12, L3-5), COPD, CAD, dementia, falls,  GERD, hemorroids, HOH, HTN, Lewy Body Dementia, macular degeneration disease, osteoporosis, PAF, prostate cancer, prostate disorder, sciatic pain, sleep apnea, temporary cerebral vascular dysfunction. Comes with aide, having multiple near falls per day per aide, mainly to the sides.    Limitations Walking;Standing;House hold activities;Other (comment);Lifting;Sitting    How long can you sit comfortably? n/a back pain no longer limiting    How long can you stand comfortably? 10-12 with walker    How long can you walk comfortably? walks 15 minutes with a walker    Diagnostic tests MRI from [place] dated 03/28/2019 report was reviewed today. Acute compression fractures are noted at T12, L3, and L4. He has facet arthropathy at the lower lumbar levels. At L3-4 he has moderate central stenosis.    Patient Stated Goals walk without stumbling, be able to be steady enough to not have to have someone holding his gait belt.    Currently in Pain? No/denies                BP : 155/75     CGA/supervision for stability    -Hip extension with BUE upper extremity support, cueing for neutral hip alignment, upright posture for optimal muscle recruitment, and sequencing, 2x10x each LE,    -Hip abduction with BUE upper extremity support, cueing for neutral foot alignment for correct muscle activation, 10x each LE   -Hip flexion with BUE upper extremity support, cueing for body mechanics, speed of muscle recruitment for optimal strengthening and stabilization  10x each LE   -Hamstring curl with BUE upper extremity support, cueing for knee alignment for recruitment of hamstring musculature, 10x each LE with 5LB ankle weight      Seated:    Seated with # 5lb  ankle weights    -Seated marches with upright posture, back away from back of chair for abdominal/trunk activation/stabilization, 3x10 each LE   -Seated LAQ with 3 second holds, 2x10 each LE, cueing for muscle activation and sequencing for neutral alignment   -Seated IR/ER with cueing for stabilizing knee placement with lateral foot movement for optimal muscle recruitment, x10 each LE    10x STS without UE support, very challenging with eccentric control.    Standing balloon taps reaching inside/outside BOS x 2 minutes feet repositioned to feet together for last min, minA intermittently due to LOB.  Seated RTB row 20x    Pt educated throughout session about proper posture and technique with exercises. Improved exercise technique, movement at target joints, use of target muscles after min to mod verbal, visual, tactile cues.    Patient response/clinical impression: The patient demonstrated improved exercise technique and form with visual and tactile cueing. Pt unable to stand from standard chair height without UE support, but able to do it with foam pad to elevate chair in place. A few LOB noted in standing with decreased base of support. The patient would benefit from further skilled PT intervention to continue to progress towards goals and improve QOL as  well as safety.     PT Education - 08/07/20 [phone]    Education provided Yes    Education Details exercise technique, body mechanics    Person(s) Educated Patient    Methods Explanation;Demonstration;Tactile cues;Verbal cues    Comprehension Verbalized understanding;Returned demonstration;Verbal cues required;Tactile cues required;Need further instruction            PT Short Term Goals - 07/12/20 0959      PT SHORT TERM GOAL #1   Title Patient will be independent in home exercise program to improve strength/mobility for better functional independence with ADLs.    Baseline 11/11: HEP given    Time 4    Period Weeks    Status New    Target Date 08/09/20             PT Long Term Goals - 07/12/20 0959      PT LONG TERM GOAL #1   Title Patient will increase FOTO score to equal to or greater than 51% to demonstrate statistically significant improvement in mobility and quality of life.    Baseline 11/11: 42%    Time 12    Period Weeks    Status New    Target Date 10/04/20      PT LONG TERM GOAL #2   Title Patient will demonstrate an improved Berg Balance Score of > 40/56 as to demonstrate improved balance with ADLs such as sitting/standing and transfer balance and reduced fall risk.    Baseline 11/11: 34/56    Time 12    Period Weeks    Status New    Target Date 10/04/20      PT LONG TERM GOAL #3   Title Patient (> 48 years old) will complete five times sit to stand test in < 15 seconds without UE support indicating an increased LE strength and improved balance    Baseline 11/11; 23.35 seconds with UE support    Time 12    Period Weeks    Status New  Target Date 10/04/20      PT LONG TERM GOAL #4   Title Patient will increase 10 meter walk test to >1.27m/s with LRAD  as to improve gait speed for better community ambulation and to reduce fall risk.    Baseline 11/11: 0.74 m/s without AD with one near LOB    Time 12    Period Weeks    Status New    Target Date  10/04/20      PT LONG TERM GOAL #5   Title Patient will increase BLE gross strength to 4+/5 as to improve functional strength for independent gait, increased standing tolerance and increased ADL ability.    Baseline 11/11: see note    Time 12    Period Weeks    Status New    Target Date 10/04/20                 Plan - 08/07/20 0843    Clinical Impression Statement The patient demonstrated improved exercise technique and form with visual and tactile cueing. Pt unable to stand from standard chair height without UE support, but able to do it with foam pad to elevate chair in place. A few LOB noted in standing with decreased base of support. The patient would benefit from further skilled PT intervention to continue to progress towards goals and improve QOL as well as safety.    Personal Factors and Comorbidities Age;Comorbidity 3+;Fitness;Past/Current Experience;Sex;Social Background;Time since onset of injury/illness/exacerbation;Transportation    Comorbidities AAA, arthritis, atherosclerotic heart disease, (s/p CABG), benign neoplasm of ascending/descending colon, COPD, CAD, dementia, dysrhythmia, frequent falls, GERD, hemorrhoid, HOH, HTN, PAF, prostate cancer.    Examination-Activity Limitations Bathing;Bend;Carry;Dressing;Continence;Stairs;Squat;Reach Overhead;Locomotion Level;Lift;Stand;Transfers;Toileting;Bed Mobility    Examination-Participation Restrictions Church;Cleaning;Community Activity;Driving;Interpersonal Relationship;Laundry;Volunteer;Shop;Personal Finances;Meal Prep;Yard Work    Product manager    Rehab Potential Fair    Clinical Impairments Affecting Rehab Potential (+) good support system, high prior level of function (-) currently undergoing radiation therapy for prostate cancer, memory deficits     PT Frequency 2x / week    PT Duration 12 weeks    PT Treatment/Interventions ADLs/Self Care Home Management;Aquatic  Therapy;Cryotherapy;Moist Heat;Traction;Ultrasound;Therapeutic activities;Functional mobility training;Stair training;Gait training;DME Instruction;Therapeutic exercise;Balance training;Neuromuscular re-education;Patient/family education;Manual techniques;Passive range of motion;Energy conservation;Vestibular;Taping;Biofeedback;Electrical Stimulation;Iontophoresis 4mg /ml Dexamethasone;Dry needling;Visual/perceptual remediation/compensation;Cognitive remediation;Canalith Repostioning    PT Next Visit Plan balance, strength    PT Home Exercise Plan see above    Consulted and Agree with Plan of Care Patient;Family member/caregiver    Family Member Consulted aide           Patient will benefit from skilled therapeutic intervention in order to improve the following deficits and impairments:  Abnormal gait, Cardiopulmonary status limiting activity, Decreased activity tolerance, Decreased balance, Decreased knowledge of precautions, Decreased endurance, Decreased coordination, Decreased cognition, Decreased knowledge of use of DME, Decreased mobility, Difficulty walking, Decreased safety awareness, Decreased strength, Impaired flexibility, Impaired perceived functional ability, Impaired sensation, Postural dysfunction, Improper body mechanics, Pain, Impaired vision/preception, Decreased range of motion  Visit Diagnosis: Unsteadiness on feet  Muscle weakness (generalized)  Other abnormalities of gait and mobility  Chronic low back pain, unspecified back pain laterality, unspecified whether sciatica present  Difficulty in walking, not elsewhere classified     Problem List Patient Active Problem List   Diagnosis Date Noted  . AAA (abdominal aortic aneurysm) without rupture ([place]) 06/29/2020  . LBD (Lewy body dementia) ([place]) 05/14/2020  . Numbness and tingling in left hand 03/02/2020  . Hematuria   . Acute kidney injury  superimposed on CKD ([place])   . Sepsis due to gram-negative UTI ([place])  12/26/2019  . Left nephrolithiasis 12/26/2019  . Hydronephrosis, left 12/26/2019  . Status post abdominal aortic aneurysm (AAA) repair 04/15/2019  . Do not intubate, cardiopulmonary resuscitation (CPR)-only code status 10/08/2018  . Prostate cancer ([place]) 02/25/2018  . Prostatic cyst 01/15/2018  . Blood in stool   . Abnormal feces   . Stricture and stenosis of esophagus   . Mild cognitive impairment 08/18/2017  . Senile purpura ([place]) 04/29/2017  . Anemia 04/29/2017  . Frequent falls 04/29/2017  . Simple chronic bronchitis ([place]) 12/17/2016  . Benign localized hyperplasia of prostate with urinary obstruction 07/15/2016  . Elevated PSA 07/15/2016  . Incomplete emptying of bladder 07/15/2016  . Urge incontinence 07/15/2016  . Hypothyroidism 04/25/2016  . Macular degeneration 04/25/2016  . Erectile dysfunction due to arterial insufficiency   . Ischemic heart disease with chronotropic incompetence   . CN (constipation) 02/10/2015  . DD (diverticular disease) 02/10/2015  . Weakness 02/10/2015  . Lichen planus 94/03/6807  . Restless leg 02/10/2015  . 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 B12 deficiency 02/10/2015  . COPD (chronic obstructive pulmonary disease) ([place]) 11/14/2014  . Post Inflammatory Lung Changes 11/14/2014  . PAF (paroxysmal atrial fibrillation) (HCC) CHA2DS2-VASc = 4. AC = Eliquis   . Insomnia 12/09/2013  . OSA on CPAP   . Dyspnea on exertion - -essentially resolved with BB dose reduction & wgt loss 03/29/2013  . Overweight (BMI 25.0-29.9) -- Wgt back up 01/17/2013  . Hx of CABG   . Essential hypertension   . Hyperlipidemia with target LDL less than 70   . Aorto-iliac disease ([place])   . BCC (basal cell carcinoma), eyelid 01/03/2013  . Allergic rhinitis 08/17/2009  . Adaptation reaction 05/28/2009  . Acid reflux 05/28/2009  . Arthritis, degenerative 05/28/2009  . 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 Carotid artery occlusion without infarction  06/01/2006  . PAD (peripheral artery disease) - bilateral common iliac stents 12/15/2005  . Atherosclerotic heart disease of artery bypass graft 09/01/1997    Lieutenant Diego PT, DPT 9:58 AM,08/07/20   [place] [facility]IN [facility] SERVICES [address] [place], [place], [address] Phone: [phone]   Fax:  [phone]  Name: [name] MRN: [id number] Date of Birth: 05/22/1934

## 2020-08-09 ENCOUNTER — Ambulatory Visit: Payer: Medicare Other

## 2020-08-09 ENCOUNTER — Other Ambulatory Visit: Payer: Self-pay

## 2020-08-09 DIAGNOSIS — R2681 Unsteadiness on feet: Secondary | ICD-10-CM | POA: Diagnosis not present

## 2020-08-09 DIAGNOSIS — R2689 Other abnormalities of gait and mobility: Secondary | ICD-10-CM

## 2020-08-09 DIAGNOSIS — M6281 Muscle weakness (generalized): Secondary | ICD-10-CM

## 2020-08-09 NOTE — Therapy (Signed)
[place] MAIN [facility] SERVICES [address] [place], [place], [address] Phone: [phone]   Fax:  [phone]  Physical Therapy Treatment  Patient Details  Name: [name] MRN: [id number] Date of Birth: [birth year] Referring Provider (PT): Jennings Books K    Encounter Date: 08/09/2020   PT End of Session - 08/09/20 0854    Visit Number 7    Number of Visits 24    Date for PT Re-Evaluation 10/04/20    Authorization Type 7/10 eval 07/12/20    PT Start Time 0845    PT Stop Time 0931    PT Time Calculation (min) 46 min    Equipment Utilized During Treatment Gait belt    Activity Tolerance Patient tolerated treatment well;Patient limited by fatigue    Behavior During Therapy [facility] for tasks assessed/performed           Past Medical History:  Diagnosis Date  . AAA (abdominal aortic aneurysm) ([place]) 05/2019   Relativley Rapid progression from June-Aug 2020 (4.6 - 5.4 cm): s/p EVAR (Dr. Lucky Cowboy [facility]): EVAR - 23 mm prox, 12 cm distal & x 18 cm length Gore Excluder Endoprosthesis Main body (via RFA distal to lowest Renal A); 71m x 14 cm L Contralateral Limb for L Iliac - extended with 8 mm x 6cm LifeStar stent (post-dilated with 7 mm DEB).  Additional R Iliac PTA not required.   . 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 ([place] 09/15/2017   09-15-2017 DIAGNOSIS:  A. COLON POLYP X 2, ASCENDING; COLD SNARE:  - TUBULAR ADENOMA.  - NEGATIVE FOR HIGH-GRADE DYSPLASIA AND MALIGNANCY.  - NO ADDITIONAL TISSUE IDENTIFIED; FECAL MATERIAL PRESENT.   B. COLON POLYP X 2, DESCENDING; COLD SNARE:  - ADENOCARCINOMA ARISING IN A DYSPLASTIC SESSILE SERRATED ADENOMA, WITH  LYMPHOVASCULAR INVASION, SEE COMMENT.  - TUBULAR ADENOMA, 2 FRAGMENTS, NEGATIVE FO  . Arthritis   . Basal cell carcinoma of eyelid 01/03/2013   2019 - R side of Nose  . Benign neoplasm of ascending colon   . Benign neoplasm of descending colon   . Bilateral iliac artery stenosis ([place] 2007   Bilateral Iliac  A stenting; extension of EVAR limbs into both Iliacs -- additional stent placed in L Iliac.  . Cancer ([place]    Skin cancer  . Colon polyps   . Complication of anesthesia    severe confusion agitation requiring hospital admission x 4 days  . Compression fracture of fourth lumbar vertebra ([place] 04/2019   - s/p Kyphoplasty; T12, L3-5 ([facility]  . COPD (chronic obstructive pulmonary disease) (HCC)    Centrilobular Emphysema  . Coronary artery disease involving native coronary artery without angina pectoris 1998   - s/p CABG, occluded SVG-D1; patent LIMA-LAD, SVG-OM, SVG- RCA  . Dementia ([place]    memory loss - started noting late 2019  . Falls frequently    broke foot  . Fracture 2021   left foot  . GERD (gastroesophageal reflux disease)   . Hemorrhoid 02/10/2015  . History of hiatal hernia   . History of kidney stones   . HOH (hard of hearing)   . Hypertension   . Lewy body dementia ([place] 04/04/2020  . Macular degeneration disease   . Osteoporosis   . PAF (paroxysmal atrial fibrillation) ([place]    on Eliquis for OAC - Rate Control   . Prostate cancer ([place]   . Prostate disorder   . Sciatic pain    Chronic  . Sleep apnea  cpap  . Temporary cerebral vascular dysfunction 02/10/2015   Had negative work-up.  July, 2012.  No medication changes, had forgot them that day, got dehydrated.   . Urinary incontinence     Past Surgical History:  Procedure Laterality Date  . ABDOMINAL AORTIC ENDOVASCULAR STENT GRAFT Bilateral 05/18/2019   Procedure: ABDOMINAL AORTIC ENDOVASCULAR STENT GRAFT;  Surgeon: Algernon Huxley, MD;  Location: [facility] ORS;  Service: Vascular;  Laterality: Bilateral;  . ANKLE SURGERY Left    ORIF   . Arch aortogram and carotid aortogram  06/02/2006   Dr Oneida Alar did surgery  . BASAL CELL CARCINOMA EXCISION  04/2016   Dermatology  . CARDIAC CATHETERIZATION  01/30/1998    (Dr. Linard Millers): Native LAD & mRCA CTO. 90% D1. Mod Cx. SVG-D1 CTO. SVG-RCA & SVG-OM along with LIMA-LAD  patent;  LV NORMAL Fxn  . CAROTID ENDARTERECTOMY Left Oct. 29, 2007   Dr. Oneida Alar:  . CATARACT EXTRACTION W/PHACO Right 03/05/2017   Procedure: CATARACT EXTRACTION PHACO AND INTRAOCULAR LENS PLACEMENT (IOC);  Surgeon: Leandrew Koyanagi, MD;  Location: [facility] ORS;  Service: Ophthalmology;  Laterality: Right;  [place] 00:58.5 AP% 10.6 CDE 6.20 Fluid Pack lot # [id number]  . CATARACT EXTRACTION W/PHACO Left 04/15/2017   Procedure: CATARACT EXTRACTION PHACO AND INTRAOCULAR LENS PLACEMENT ([place])  Left;  Surgeon: Leandrew Koyanagi, MD;  Location: [place];  Service: Ophthalmology;  Laterality: Left;  . COLONOSCOPY WITH PROPOFOL N/A 09/15/2017   Procedure: COLONOSCOPY WITH PROPOFOL;  Surgeon: Lucilla Lame, MD;  Location: [facility] ENDOSCOPY;  Service: Endoscopy;  Laterality: N/A;  . COLONOSCOPY WITH PROPOFOL N/A 10/26/2018   Procedure: COLONOSCOPY WITH PROPOFOL;  Surgeon: Lucilla Lame, MD;  [facility];  Service: Endoscopy;  Laterality: N/A;  . CORONARY ARTERY BYPASS GRAFT  1998   LIMA-LAD, SVG-OM, SVG-RPDA, SVG-DIAG  . CPET / MET  12/2012   Mild chronotropic incompetence - read 82% of predicted; also reduced effort; peak VO2 15.7 / 75% (did not reach Max effort) -- suggested ischemic response in last 1.5 minutes of exercise. Normal pulmonary function on PFTs but poor response to  . CYSTOSCOPY W/ RETROGRADES Left 01/13/2020   Procedure: CYSTOSCOPY WITH RETROGRADE PYELOGRAM;  Surgeon: Billey Co, MD;  Location: [facility] ORS;  Service: Urology;  Laterality: Left;  . CYSTOSCOPY WITH STENT PLACEMENT Left 12/26/2019   Procedure: CYSTOSCOPY WITH STENT PLACEMENT;  Surgeon: Billey Co, MD;  Location: [facility] ORS;  Service: Urology;  Laterality: Left;  . CYSTOSCOPY/URETEROSCOPY/HOLMIUM LASER/STENT PLACEMENT Left 01/13/2020   Procedure: CYSTOSCOPY/URETEROSCOPY/HOLMIUM LASER/STENT EXCHANGE;  Surgeon: Billey Co, MD;  Location: [facility] ORS;  Service: Urology;  Laterality: Left;  . ENDOVASCULAR  REPAIR/STENT GRAFT N/A 05/18/2019   Procedure: ENDOVASCULAR REPAIR/STENT GRAFT;  Surgeon: Algernon Huxley, MD;  Location: [facility] INVASIVE CV LAB;; EVAR - 23 mm prox, 12 cm distal & x 18 cm length Gore Excluder Endoprosthesis Main body (via RFA distal to lowest Renal A); 70m x 14 cm L Contralateral Limb for L Iliac - extended with 8 mm x 6cm LifeStar stent; no additional R Iliac PTA needed.   . ESOPHAGOGASTRODUODENOSCOPY (EGD) WITH PROPOFOL N/A 09/15/2017   Procedure: ESOPHAGOGASTRODUODENOSCOPY (EGD) WITH PROPOFOL;  Surgeon: WLucilla Lame MD;  [facility];  Service: Endoscopy;  Laterality: N/A;  . FRACTURE SURGERY Right 03/19/2015   wrist  . FRACTURE SURGERY Left   . HIATAL HERNIA REPAIR    . ILIAC ARTERY STENT  12/15/2005   PTA and direct stenting rgt and lft common iliac arteries  . KYPHOPLASTY  N/A 04/05/2019   Procedure: KYPHOPLASTY T12, L3, L4 L5;  Surgeon: Hessie Knows, MD;  Location: [facility] ORS;  Service: Orthopedics;  Laterality: N/A;  . NM GATED MYOVIEW ([place] HX)  05/'19; 8/'20   a) [facility]: Low Risk - no ischemia or infarct.  EF > 65%. no RWMA;; b) [facility]): Normal wall motion.  No ischemia or infarction.  . OPEN REDUCTION INTERNAL FIXATION (ORIF) DISTAL RADIAL FRACTURE Left 01/13/2019   Procedure: OPEN REDUCTION INTERNAL FIXATION (ORIF) DISTAL  RADIAL FRACTURE - LEFT - SLEEP APNEA;  Surgeon: Hessie Knows, MD;  Location: [facility] ORS;  Service: Orthopedics;  Laterality: Left;  . ORIF ELBOW FRACTURE Right 01/01/2017   Procedure: OPEN REDUCTION INTERNAL FIXATION (ORIF) ELBOW/OLECRANON FRACTURE;  Surgeon: Hessie Knows, MD;  Location: [facility] ORS;  Service: Orthopedics;  Laterality: Right;  . ORIF WRIST FRACTURE Right 03/19/2015   Procedure: OPEN REDUCTION INTERNAL FIXATION (ORIF) WRIST FRACTURE;  Surgeon: Hessie Knows, MD;  Location: [facility] ORS;  Service: Orthopedics;  Laterality: Right;  . SHOULDER ARTHROSCOPY Right   . THORACIC AORTA - CAROTID ANGIOGRAM  October 2007   Dr. 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 TRANSTHORACIC ECHOCARDIOGRAM  February 2016   [facility]: Normal LV function. Dilated left atrium.  Domingo Dimes ECHOCARDIOGRAM  04/2019   [facility] April 20, 2019): EF 55-60 %.  Mild LVH.  Relatively normal valves.    There were no vitals filed for this visit.   Subjective Assessment - 08/09/20 0854    Subjective Patient reports no falls, no pain today. Arrived with aide.    Pertinent History Patient is returning to PT for evaluation of balance. Patient has been seen by this therapist in the past. PMH AAA, arthritis, basal cell carcinoma of eyelid, benign neoplasm of ascending/descending colon, bilateral iliac artery stenosis, colon polyps, compression fx of 4th lumbar vertebra (s/p kyphoplasty T12, L3-5), COPD, CAD, dementia, falls,  GERD, hemorroids, HOH, HTN, Lewy Body Dementia, macular degeneration disease, osteoporosis, PAF, prostate cancer, prostate disorder, sciatic pain, sleep apnea, temporary cerebral vascular dysfunction. Comes with aide, having multiple near falls per day per aide, mainly to the sides.    Limitations Walking;Standing;House hold activities;Other (comment);Lifting;Sitting    How long can you sit comfortably? n/a back pain no longer limiting    How long can you stand comfortably? 10-12 with walker    How long can you walk comfortably? walks 15 minutes with a walker    Diagnostic tests MRI from [place] dated 03/28/2019 report was reviewed today. Acute compression fractures are noted at T12, L3, and L4. He has facet arthropathy at the lower lumbar levels. At L3-4 he has moderate central stenosis.    Patient Stated Goals walk without stumbling, be able to be steady enough to not have to have someone holding his gait belt.    Currently in Pain? No/denies             BP at start of session: 93/46   TherEx: Nustep Lvl 4 RPM> 40 for  cardiovascular and musculoskeletal challenge 4 minutes   Sit to stands 10x   Standing with # 4 ankle weight: CGA for stability  -Hip extension with BUE upper extremity support, cueing for neutral hip alignment, upright posture for optimal muscle recruitment, and sequencing, 10x each LE,  -Hip abduction with BUE upper extremity support, cueing for neutral foot alignment for correct muscle activation, 10x each LE -Hip flexion with BUE  upper extremity support, cueing for body mechanics, speed of muscle recruitment for optimal strengthening and stabilization 10x each LE -Hamstring curl with BUE upper extremity support, cueing for knee alignment for recruitment of hamstring musculature, 10x each LE   Seated with # 4 ankle weights  -Seated marches with upright posture, back away from back of chair for abdominal/trunk activation/stabilization, 10x each LE -Seated LAQ with 3 second holds, 10x each LE, cueing for muscle activation and sequencing for neutral alignment -Seated IR/ER with cueing for stabilizing knee placement with lateral foot movement for optimal muscle recruitment, 10x each LE   Neuro Re-ed:   airex pad: -4lb dumbbells bicep curls 10x ; very challenging with posterior trunk LOB -4lb dumbell alternating punches 8x each UE, fatiguing but no LOB -obtain ball from package, throw at target for pertubations x 14 balls.  -6" step modified tandem stance 40 second holds, more challenging with LLE posteriorly positioned.   BP at end of session: 106/58      Pt educated throughout session about proper posture and technique with exercises. Improved exercise technique, movement at target joints, use of target muscles after min to mod verbal, visual, tactile cues.                       PT Short Term Goals - 07/12/20 0959      PT SHORT TERM GOAL #1   Title Patient will be independent in home exercise program to improve strength/mobility for better functional independence  with ADLs.    Baseline 11/11: HEP given    Time 4    Period Weeks    Status New    Target Date 08/09/20             PT Long Term Goals - 07/12/20 0959      PT LONG TERM GOAL #1   Title Patient will increase FOTO score to equal to or greater than 51% to demonstrate statistically significant improvement in mobility and quality of life.    Baseline 11/11: 42%    Time 12    Period Weeks    Status New    Target Date 10/04/20      PT LONG TERM GOAL #2   Title Patient will demonstrate an improved Berg Balance Score of > 40/56 as to demonstrate improved balance with ADLs such as sitting/standing and transfer balance and reduced fall risk.    Baseline 11/11: 34/56    Time 12    Period Weeks    Status New    Target Date 10/04/20      PT LONG TERM GOAL #3   Title Patient (> 68 years old) will complete five times sit to stand test in < 15 seconds without UE support indicating an increased LE strength and improved balance    Baseline 11/11; 23.35 seconds with UE support    Time 12    Period Weeks    Status New    Target Date 10/04/20      PT LONG TERM GOAL #4   Title Patient will increase 10 meter walk test to >1.20ms with LRAD  as to improve gait speed for better community ambulation and to reduce fall risk.    Baseline 11/11: 0.74 m/s without AD with one near LOB    Time 12    Period Weeks    Status New    Target Date 10/04/20      PT LONG TERM GOAL #5   Title Patient will  increase BLE gross strength to 4+/5 as to improve functional strength for independent gait, increased standing tolerance and increased ADL ability.    Baseline 11/11: see note    Time 12    Period Weeks    Status New    Target Date 10/04/20                 Plan - 08/09/20 0907    Clinical Impression Statement Patient is challenged with task orientation occasionally when fatigued requiring visual, verbal, and demonstration cueing. He continues to progress with functional strength with decreased  need for rest breaks this session indicating improved tolerance of standing interventions. Patient will benefit from skilled physical therapy to increase strength, balance, and mobility to allow patient to reduce fall risk and increase independence in daily life.    Personal Factors and Comorbidities Age;Comorbidity 3+;Fitness;Past/Current Experience;Sex;Social Background;Time since onset of injury/illness/exacerbation;Transportation    Comorbidities AAA, arthritis, atherosclerotic heart disease, (s/p CABG), benign neoplasm of ascending/descending colon, COPD, CAD, dementia, dysrhythmia, frequent falls, GERD, hemorrhoid, HOH, HTN, PAF, prostate cancer.    Examination-Activity Limitations Bathing;Bend;Carry;Dressing;Continence;Stairs;Squat;Reach Overhead;Locomotion Level;Lift;Stand;Transfers;Toileting;Bed Mobility    Examination-Participation Restrictions Church;Cleaning;Community Activity;Driving;Interpersonal Relationship;Laundry;Volunteer;Shop;Personal Finances;Meal Prep;Yard Work    Product manager    Rehab Potential Fair    Clinical Impairments Affecting Rehab Potential (+) good support system, high prior level of function (-) currently undergoing radiation therapy for prostate cancer, memory deficits     PT Frequency 2x / week    PT Duration 12 weeks    PT Treatment/Interventions ADLs/Self Care Home Management;Aquatic Therapy;Cryotherapy;Moist Heat;Traction;Ultrasound;Therapeutic activities;Functional mobility training;Stair training;Gait training;DME Instruction;Therapeutic exercise;Balance training;Neuromuscular re-education;Patient/family education;Manual techniques;Passive range of motion;Energy conservation;Vestibular;Taping;Biofeedback;Electrical Stimulation;Iontophoresis 4mg /ml Dexamethasone;Dry needling;Visual/perceptual remediation/compensation;Cognitive remediation;Canalith Repostioning    PT Next Visit Plan balance, strength    PT Home  Exercise Plan see above    Consulted and Agree with Plan of Care Patient;Family member/caregiver    Family Member Consulted aide           Patient will benefit from skilled therapeutic intervention in order to improve the following deficits and impairments:  Abnormal gait,Cardiopulmonary status limiting activity,Decreased activity tolerance,Decreased balance,Decreased knowledge of precautions,Decreased endurance,Decreased coordination,Decreased cognition,Decreased knowledge of use of DME,Decreased mobility,Difficulty walking,Decreased safety awareness,Decreased strength,Impaired flexibility,Impaired perceived functional ability,Impaired sensation,Postural dysfunction,Improper body mechanics,Pain,Impaired vision/preception,Decreased range of motion  Visit Diagnosis: Unsteadiness on feet  Muscle weakness (generalized)  Other abnormalities of gait and mobility     Problem List Patient Active Problem List   Diagnosis Date Noted  . AAA (abdominal aortic aneurysm) without rupture ([place]) 06/29/2020  . LBD (Lewy body dementia) ([place]) 05/14/2020  . Numbness and tingling in left hand 03/02/2020  . Hematuria   . Acute kidney injury superimposed on CKD ([place])   . Sepsis due to gram-negative UTI ([place]) 12/26/2019  . Left nephrolithiasis 12/26/2019  . Hydronephrosis, left 12/26/2019  . Status post abdominal aortic aneurysm (AAA) repair 04/15/2019  . Do not intubate, cardiopulmonary resuscitation (CPR)-only code status 10/08/2018  . Prostate cancer ([place]) 02/25/2018  . Prostatic cyst 01/15/2018  . Blood in stool   . Abnormal feces   . Stricture and stenosis of esophagus   . Mild cognitive impairment 08/18/2017  . Senile purpura ([place]) 04/29/2017  . Anemia 04/29/2017  . Frequent falls 04/29/2017  . Simple chronic bronchitis ([place]) 12/17/2016  . Benign localized hyperplasia of prostate with urinary obstruction 07/15/2016  . Elevated PSA 07/15/2016  . Incomplete emptying of bladder 07/15/2016  .  Urge incontinence 07/15/2016  . Hypothyroidism 04/25/2016  . Macular  degeneration 04/25/2016  . Erectile dysfunction due to arterial insufficiency   . Ischemic heart disease with chronotropic incompetence   . CN (constipation) 02/10/2015  . DD (diverticular disease) 02/10/2015  . Weakness 02/10/2015  . Lichen planus 09/11/347  . Restless leg 02/10/2015  . 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 B12 deficiency 02/10/2015  . COPD (chronic obstructive pulmonary disease) ([place]) 11/14/2014  . Post Inflammatory Lung Changes 11/14/2014  . PAF (paroxysmal atrial fibrillation) (HCC) CHA2DS2-VASc = 4. AC = Eliquis   . Insomnia 12/09/2013  . OSA on CPAP   . Dyspnea on exertion - -essentially resolved with BB dose reduction & wgt loss 03/29/2013  . Overweight (BMI 25.0-29.9) -- Wgt back up 01/17/2013  . Hx of CABG   . Essential hypertension   . Hyperlipidemia with target LDL less than 70   . Aorto-iliac disease ([place])   . BCC (basal cell carcinoma), eyelid 01/03/2013  . Allergic rhinitis 08/17/2009  . Adaptation reaction 05/28/2009  . Acid reflux 05/28/2009  . Arthritis, degenerative 05/28/2009  . 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 Carotid artery occlusion without infarction 06/01/2006  . PAD (peripheral artery disease) - bilateral common iliac stents 12/15/2005  . Atherosclerotic heart disease of artery bypass graft 09/01/1997   Janna Arch, PT, DPT   08/09/2020, 9:36 AM  [place] MAIN [facility] SERVICES [address] [place], [place], [address] Phone: [phone]   Fax:  [phone]  Name: METE PURDUM MRN: [id number] Date of Birth: 11-Jun-1934

## 2020-08-14 ENCOUNTER — Ambulatory Visit: Payer: Medicare Other

## 2020-08-14 ENCOUNTER — Other Ambulatory Visit: Payer: Self-pay

## 2020-08-14 DIAGNOSIS — R2681 Unsteadiness on feet: Secondary | ICD-10-CM | POA: Diagnosis not present

## 2020-08-14 DIAGNOSIS — M545 Low back pain, unspecified: Secondary | ICD-10-CM

## 2020-08-14 DIAGNOSIS — R262 Difficulty in walking, not elsewhere classified: Secondary | ICD-10-CM

## 2020-08-14 DIAGNOSIS — M6281 Muscle weakness (generalized): Secondary | ICD-10-CM

## 2020-08-14 DIAGNOSIS — R2689 Other abnormalities of gait and mobility: Secondary | ICD-10-CM

## 2020-08-14 DIAGNOSIS — G8929 Other chronic pain: Secondary | ICD-10-CM

## 2020-08-14 NOTE — Therapy (Signed)
[place] MAIN [facility] SERVICES [address] [place], [place], [address] Phone: [phone]   Fax:  [phone]  Physical Therapy Treatment  Patient Details  Name: [name] MRN: [id number] Date of Birth: Apr 18, 1934 Referring Provider (PT): Jennings Books K    Encounter Date: 08/14/2020   PT End of Session - 08/14/20 0858    Visit Number 8    Number of Visits 24    Date for PT Re-Evaluation 10/04/20    PT Start Time 0852    PT Stop Time 0930    PT Time Calculation (min) 38 min    Equipment Utilized During Treatment Gait belt    Activity Tolerance Patient tolerated treatment well;Patient limited by fatigue    Behavior During Therapy [facility] for tasks assessed/performed           Past Medical History:  Diagnosis Date   AAA (abdominal aortic aneurysm) ([place]) 05/2019   Relativley Rapid progression from June-Aug 2020 (4.6 - 5.4 cm): s/p EVAR (Dr. Lucky Cowboy [facility]): EVAR - 23 mm prox, 12 cm distal & x 18 cm length Gore Excluder Endoprosthesis Main body (via RFA distal to lowest Renal A); 17m x 14 cm L Contralateral Limb for L Iliac - extended with 8 mm x 6cm LifeStar stent (post-dilated with 7 mm DEB).  Additional R Iliac PTA not required.    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 ([place] 09/15/2017   09-15-2017 DIAGNOSIS:  A. COLON POLYP X 2, ASCENDING; COLD SNARE:  - TUBULAR ADENOMA.  - NEGATIVE FOR HIGH-GRADE DYSPLASIA AND MALIGNANCY.  - NO ADDITIONAL TISSUE IDENTIFIED; FECAL MATERIAL PRESENT.   B. COLON POLYP X 2, DESCENDING; COLD SNARE:  - ADENOCARCINOMA ARISING IN A DYSPLASTIC SESSILE SERRATED ADENOMA, WITH  LYMPHOVASCULAR INVASION, SEE COMMENT.  - TUBULAR ADENOMA, 2 FRAGMENTS, NEGATIVE FO   Arthritis    Basal cell carcinoma of eyelid 01/03/2013   2019 - R side of Nose   Benign neoplasm of ascending colon    Benign neoplasm of descending colon    Bilateral iliac artery stenosis ([place] 2007   Bilateral Iliac A stenting; extension of EVAR limbs into  both Iliacs -- additional stent placed in L Iliac.   Cancer ([facility]    Skin cancer   Colon polyps    Complication of anesthesia    severe confusion agitation requiring hospital admission x 4 days   Compression fracture of fourth lumbar vertebra ([place] 04/2019   - s/p Kyphoplasty; T12, L3-5 (*[facility])   COPD (chronic obstructive pulmonary disease) (HCC)    Centrilobular Emphysema   Coronary artery disease involving native coronary artery without angina pectoris 1998   - s/p CABG, occluded SVG-D1; patent LIMA-LAD, SVG-OM, SVG- RCA   Dementia ([place]    memory loss - started noting late 2019   Falls frequently    broke foot   Fracture 2021   left foot   GERD (gastroesophageal reflux disease)    Hemorrhoid 02/10/2015   History of hiatal hernia    History of kidney stones    HOH (hard of hearing)    Hypertension    Lewy body dementia ([place] 04/04/2020   Macular degeneration disease    Osteoporosis    PAF (paroxysmal atrial fibrillation) (HCC)    on Eliquis for OAC - Rate Control    Prostate cancer (HCC)    Prostate disorder    Sciatic pain    Chronic   Sleep apnea    cpap   Temporary cerebral vascular dysfunction 02/10/2015  Had negative work-up.  July, 2012.  No medication changes, had forgot them that day, got dehydrated.    Urinary incontinence     Past Surgical History:  Procedure Laterality Date   ABDOMINAL AORTIC ENDOVASCULAR STENT GRAFT Bilateral 05/18/2019   Procedure: ABDOMINAL AORTIC ENDOVASCULAR STENT GRAFT;  Surgeon: Algernon Huxley, MD;  Location: [facility] ORS;  Service: Vascular;  Laterality: Bilateral;   ANKLE SURGERY Left    ORIF    Arch aortogram and carotid aortogram  06/02/2006   Dr Oneida Alar did surgery   BASAL CELL CARCINOMA EXCISION  04/2016   Dermatology   CARDIAC CATHETERIZATION  01/30/1998    (Dr. Linard Millers): Native LAD & mRCA CTO. 90% D1. Mod Cx. SVG-D1 CTO. SVG-RCA & SVG-OM along with LIMA-LAD patent;  LV NORMAL Fxn   CAROTID  ENDARTERECTOMY Left Oct. 29, 2007   Dr. Oneida Alar:   CATARACT EXTRACTION W/PHACO Right 03/05/2017   Procedure: CATARACT EXTRACTION PHACO AND INTRAOCULAR LENS PLACEMENT ([place]);  Surgeon: Leandrew Koyanagi, MD;  Location: [facility] ORS;  Service: Ophthalmology;  Laterality: Right;  [place] 00:58.5 AP% 10.6 CDE 6.20 Fluid Pack lot # [id number]   CATARACT EXTRACTION W/PHACO Left 04/15/2017   Procedure: CATARACT EXTRACTION PHACO AND INTRAOCULAR LENS PLACEMENT ([place])  Left;  Surgeon: Leandrew Koyanagi, MD;  Location: [place];  Service: Ophthalmology;  Laterality: Left;   COLONOSCOPY WITH PROPOFOL N/A 09/15/2017   Procedure: COLONOSCOPY WITH PROPOFOL;  Surgeon: Lucilla Lame, MD;  Location: [facility] ENDOSCOPY;  Service: Endoscopy;  Laterality: N/A;   COLONOSCOPY WITH PROPOFOL N/A 10/26/2018   Procedure: COLONOSCOPY WITH PROPOFOL;  Surgeon: Lucilla Lame, MD;  Location: [facility] ENDOSCOPY;  Service: Endoscopy;  Laterality: N/A;   CORONARY ARTERY BYPASS GRAFT  1998   LIMA-LAD, SVG-OM, SVG-RPDA, SVG-DIAG   CPET / MET  12/2012   Mild chronotropic incompetence - read 82% of predicted; also reduced effort; peak VO2 15.7 / 75% (did not reach Max effort) -- suggested ischemic response in last 1.5 minutes of exercise. Normal pulmonary function on PFTs but poor response to   CYSTOSCOPY W/ RETROGRADES Left 01/13/2020   Procedure: CYSTOSCOPY WITH RETROGRADE PYELOGRAM;  Surgeon: Billey Co, MD;  Location: [facility] ORS;  Service: Urology;  Laterality: Left;   CYSTOSCOPY WITH STENT PLACEMENT Left 12/26/2019   Procedure: CYSTOSCOPY WITH STENT PLACEMENT;  Surgeon: Billey Co, MD;  Location: [facility] ORS;  Service: Urology;  Laterality: Left;   CYSTOSCOPY/URETEROSCOPY/HOLMIUM LASER/STENT PLACEMENT Left 01/13/2020   Procedure: CYSTOSCOPY/URETEROSCOPY/HOLMIUM LASER/STENT EXCHANGE;  Surgeon: Billey Co, MD;  Location: [facility] ORS;  Service: Urology;  Laterality: Left;   ENDOVASCULAR REPAIR/STENT GRAFT N/A 05/18/2019    Procedure: ENDOVASCULAR REPAIR/STENT GRAFT;  Surgeon: Algernon Huxley, MD;  Location: [facility] INVASIVE CV LAB;; EVAR - 23 mm prox, 12 cm distal & x 18 cm length Gore Excluder Endoprosthesis Main body (via RFA distal to lowest Renal A); 64m x 14 cm L Contralateral Limb for L Iliac - extended with 8 mm x 6cm LifeStar stent; no additional R Iliac PTA needed.    ESOPHAGOGASTRODUODENOSCOPY (EGD) WITH PROPOFOL N/A 09/15/2017   Procedure: ESOPHAGOGASTRODUODENOSCOPY (EGD) WITH PROPOFOL;  Surgeon: WLucilla Lame MD;  [facility];  Service: Endoscopy;  Laterality: N/A;   FRACTURE SURGERY Right 03/19/2015   wrist   FRACTURE SURGERY Left    HIATAL HERNIA REPAIR     ILIAC ARTERY STENT  12/15/2005   PTA and direct stenting rgt and lft common iliac arteries   KYPHOPLASTY N/A 04/05/2019   Procedure: KYPHOPLASTY T12, L3, L4 L5;  Surgeon: Hessie Knows, MD;  Location: [facility] ORS;  Service: Orthopedics;  Laterality: N/A;   NM GATED MYOVIEW ([place] HX)  05/'19; 8/'20   a) [facility]: Low Risk - no ischemia or infarct.  EF > 65%. no RWMA;; b) [facility]): Normal wall motion.  No ischemia or infarction.   OPEN REDUCTION INTERNAL FIXATION (ORIF) DISTAL RADIAL FRACTURE Left 01/13/2019   Procedure: OPEN REDUCTION INTERNAL FIXATION (ORIF) DISTAL  RADIAL FRACTURE - LEFT - SLEEP APNEA;  Surgeon: Hessie Knows, MD;  Location: [facility] ORS;  Service: Orthopedics;  Laterality: Left;   ORIF ELBOW FRACTURE Right 01/01/2017   Procedure: OPEN REDUCTION INTERNAL FIXATION (ORIF) ELBOW/OLECRANON FRACTURE;  Surgeon: Hessie Knows, MD;  Location: [facility] ORS;  Service: Orthopedics;  Laterality: Right;   ORIF WRIST FRACTURE Right 03/19/2015   Procedure: OPEN REDUCTION INTERNAL FIXATION (ORIF) WRIST FRACTURE;  Surgeon: Hessie Knows, MD;  Location: [facility] ORS;  Service: Orthopedics;  Laterality: Right;   SHOULDER ARTHROSCOPY Right    THORACIC AORTA - CAROTID ANGIOGRAM  October 2007   Dr. 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 Dilated left atrium.   TRANSTHORACIC ECHOCARDIOGRAM  04/2019   [facility] April 20, 2019): EF 55-60 %.  Mild LVH.  Relatively normal valves.    There were no vitals filed for this visit.   Subjective Assessment - 08/14/20 0857    Subjective Pt doing well today, no medical updates since last session. Pt denies any pain today.    Patient is accompained by: --   aide   Pertinent History Patient is returning to PT for evaluation of balance. Patient has been seen by this therapist in the past. PMH AAA, arthritis, basal cell carcinoma of eyelid, benign neoplasm of ascending/descending colon, bilateral iliac artery stenosis, colon polyps, compression fx of 4th lumbar vertebra (s/p kyphoplasty T12, L3-5), COPD, CAD, dementia, falls,  GERD, hemorroids, HOH, HTN, Lewy Body Dementia, macular degeneration disease, osteoporosis, PAF, prostate cancer, prostate disorder, sciatic pain, sleep apnea, temporary cerebral vascular dysfunction. Comes with aide, having multiple near falls per day per aide, mainly to the sides.    Limitations Walking;Standing;House hold activities;Other (comment);Lifting;Sitting    Currently in Pain? No/denies         Entire session performed in target of motor control training, spatial awareness. Pt purposefully given interventions to create frequent LOB to allow for high volume practice of righting strategies with physical assistance provided only as needed. Entire session performed with gait belt donned and minGuard assist of patient.     TherEx: Nustep Lvl 4 RPM> 60, seat 12, arms 10; for supervised cardiovascular conditioning, AA/ROM knees/hips/thoracic spine  -Sit to stands 10x from chair, minA for intermittent balance help (hands free); STS 2x10 hands free from chair + airex foam (needs rest between)  -overground AMB  intervals with 4lb AW: 179f; 735flateral side step; [id number] (ended 32f15frly due to jello legs)      Pt educated throughout session about proper posture and technique with exercises. Improved exercise technique, movement at target joints, use of target muscles after min to mod verbal, visual, tactile cues.         PT Short Term Goals - 07/12/20 0959      PT SHORT TERM GOAL #1   Title Patient will be independent in home exercise program to improve strength/mobility for better functional independence with ADLs.  Baseline 11/11: HEP given    Time 4    Period Weeks    Status New    Target Date 08/09/20             PT Long Term Goals - 07/12/20 0959      PT LONG TERM GOAL #1   Title Patient will increase FOTO score to equal to or greater than 51% to demonstrate statistically significant improvement in mobility and quality of life.    Baseline 11/11: 42%    Time 12    Period Weeks    Status New    Target Date 10/04/20      PT LONG TERM GOAL #2   Title Patient will demonstrate an improved Berg Balance Score of > 40/56 as to demonstrate improved balance with ADLs such as sitting/standing and transfer balance and reduced fall risk.    Baseline 11/11: 34/56    Time 12    Period Weeks    Status New    Target Date 10/04/20      PT LONG TERM GOAL #3   Title Patient (> 20 years old) will complete five times sit to stand test in < 15 seconds without UE support indicating an increased LE strength and improved balance    Baseline 11/11; 23.35 seconds with UE support    Time 12    Period Weeks    Status New    Target Date 10/04/20      PT LONG TERM GOAL #4   Title Patient will increase 10 meter walk test to >1.67ms with LRAD  as to improve gait speed for better community ambulation and to reduce fall risk.    Baseline 11/11: 0.74 m/s without AD with one near LOB    Time 12    Period Weeks    Status New    Target Date 10/04/20      PT LONG TERM GOAL #5   Title  Patient will increase BLE gross strength to 4+/5 as to improve functional strength for independent gait, increased standing tolerance and increased ADL ability.    Baseline 11/11: see note    Time 12    Period Weeks    Status New    Target Date 10/04/20                 Plan - 08/14/20 0859    Clinical Impression Statement Continued with current plan of care as laid out in evaluation and recent prior sessions. Pt remains motivated to advance progress toward goals in order to maximize independence and safety at home. Pt requires high level assistance and cuing for completion of exercises in order to provide adequate level of stimulation and perturbation. Author allows pt as much opportunity as possible to perform independent righting strategies, only stepping in when pt is unable to prevent falling to floor. Pt continues to demonstrate progress toward goals AEB progression of some interventions this date either in volume or intensity.    Personal Factors and Comorbidities Age;Comorbidity 3+;Fitness;Past/Current Experience;Sex;Social Background;Time since onset of injury/illness/exacerbation;Transportation    Comorbidities AAA, arthritis, atherosclerotic heart disease, (s/p CABG), benign neoplasm of ascending/descending colon, COPD, CAD, dementia, dysrhythmia, frequent falls, GERD, hemorrhoid, HOH, HTN, PAF, prostate cancer.    Examination-Activity Limitations Bathing;Bend;Carry;Dressing;Continence;Stairs;Squat;Reach Overhead;Locomotion Level;Lift;Stand;Transfers;Toileting;Bed Mobility    Examination-Participation Restrictions Church;Cleaning;Community Activity;Driving;Interpersonal Relationship;Laundry;Volunteer;Shop;Personal Finances;Meal Prep;Yard Work    Stability/Clinical Decision Making Evolving/Moderate complexity    Clinical Decision Making Moderate    Rehab Potential Fair    Clinical Impairments Affecting Rehab  Potential (+) good support system, high prior level of function (-)  currently undergoing radiation therapy for prostate cancer, memory deficits     PT Frequency 2x / week    PT Duration 12 weeks    PT Treatment/Interventions ADLs/Self Care Home Management;Aquatic Therapy;Cryotherapy;Moist Heat;Traction;Ultrasound;Therapeutic activities;Functional mobility training;Stair training;Gait training;DME Instruction;Therapeutic exercise;Balance training;Neuromuscular re-education;Patient/family education;Manual techniques;Passive range of motion;Energy conservation;Vestibular;Taping;Biofeedback;Electrical Stimulation;Iontophoresis 64m/ml Dexamethasone;Dry needling;Visual/perceptual remediation/compensation;Cognitive remediation;Canalith Repostioning    PT Next Visit Plan balance, strength    PT Home Exercise Plan see above    Consulted and Agree with Plan of Care Patient;Family member/caregiver    Family Member Consulted aide           Patient will benefit from skilled therapeutic intervention in order to improve the following deficits and impairments:  Abnormal gait,Cardiopulmonary status limiting activity,Decreased activity tolerance,Decreased balance,Decreased knowledge of precautions,Decreased endurance,Decreased coordination,Decreased cognition,Decreased knowledge of use of DME,Decreased mobility,Difficulty walking,Decreased safety awareness,Decreased strength,Impaired flexibility,Impaired perceived functional ability,Impaired sensation,Postural dysfunction,Improper body mechanics,Pain,Impaired vision/preception,Decreased range of motion  Visit Diagnosis: Unsteadiness on feet  Muscle weakness (generalized)  Other abnormalities of gait and mobility  Chronic low back pain, unspecified back pain laterality, unspecified whether sciatica present  Difficulty in walking, not elsewhere classified     Problem List Patient Active Problem List   Diagnosis Date Noted   AAA (abdominal aortic aneurysm) without rupture ([place] 06/29/2020   LBD (Lewy body dementia)  ([place] 05/14/2020   Numbness and tingling in left hand 03/02/2020   Hematuria    Acute kidney injury superimposed on CKD ([place]    Sepsis due to gram-negative UTI ([place] 12/26/2019   Left nephrolithiasis 12/26/2019   Hydronephrosis, left 12/26/2019   Status post abdominal aortic aneurysm (AAA) repair 04/15/2019   Do not intubate, cardiopulmonary resuscitation (CPR)-only code status 10/08/2018   Prostate cancer ([place] 02/25/2018   Prostatic cyst 01/15/2018   Blood in stool    Abnormal feces    Stricture and stenosis of esophagus    Mild cognitive impairment 08/18/2017   Senile purpura ([place] 04/29/2017   Anemia 04/29/2017   Frequent falls 04/29/2017   Simple chronic bronchitis ([place] 12/17/2016   Benign localized hyperplasia of prostate with urinary obstruction 07/15/2016   Elevated PSA 07/15/2016   Incomplete emptying of bladder 07/15/2016   Urge incontinence 07/15/2016   Hypothyroidism 04/25/2016   Macular degeneration 04/25/2016   Erectile dysfunction due to arterial insufficiency    Ischemic heart disease with chronotropic incompetence    CN (constipation) 02/10/2015   DD (diverticular disease) 02/10/2015   Weakness 076/22/6333  Lichen planus 054/56/2563  Restless leg 02/10/2015   Circadian rhythm disorder 02/10/2015   B12 deficiency 02/10/2015   COPD (chronic obstructive pulmonary disease) ([place] 11/14/2014   Post Inflammatory Lung Changes 11/14/2014   PAF (paroxysmal atrial fibrillation) (HCC) CHA2DS2-VASc = 4. AC = Eliquis    Insomnia 12/09/2013   OSA on CPAP    Dyspnea on exertion - -essentially resolved with BB dose reduction & wgt loss 03/29/2013   Overweight (BMI 25.0-29.9) -- Wgt back up 01/17/2013   Hx of CABG    Essential hypertension    Hyperlipidemia with target LDL less than 70    Aorto-iliac disease ([place]    BCC (basal cell carcinoma), eyelid 01/03/2013   Allergic rhinitis 08/17/2009   Adaptation reaction  05/28/2009   Acid reflux 05/28/2009   Arthritis, degenerative 05/28/2009   Abnormality of aortic arch branch 06/01/2006   Carotid artery occlusion without infarction 06/01/2006   PAD (peripheral artery disease) - bilateral common  iliac stents 12/15/2005   Atherosclerotic heart disease of artery bypass graft 09/01/1997   9:23 AM, 08/14/20 Etta Grandchild, PT, DPT Physical Therapist - [place] [phone]     Etta Grandchild 08/14/2020, 9:00 AM  [place] MAIN [facility] SERVICES [address] [place], [place], [address] Phone: [phone]   Fax:  [phone]  Name: [name] MRN: [id number] Date of Birth: [birth year]

## 2020-08-16 ENCOUNTER — Ambulatory Visit: Payer: Medicare Other

## 2020-08-16 ENCOUNTER — Other Ambulatory Visit: Payer: Self-pay

## 2020-08-16 DIAGNOSIS — R2689 Other abnormalities of gait and mobility: Secondary | ICD-10-CM

## 2020-08-16 DIAGNOSIS — R2681 Unsteadiness on feet: Secondary | ICD-10-CM

## 2020-08-16 DIAGNOSIS — G8929 Other chronic pain: Secondary | ICD-10-CM

## 2020-08-16 DIAGNOSIS — M6281 Muscle weakness (generalized): Secondary | ICD-10-CM

## 2020-08-16 DIAGNOSIS — M545 Low back pain, unspecified: Secondary | ICD-10-CM

## 2020-08-16 NOTE — Therapy (Signed)
[place] MAIN [facility] SERVICES [address] [place], [place], [address] Phone: [phone]   Fax:  [phone]  Physical Therapy Treatment  Patient Details  Name: [name] MRN: [id number] Date of Birth: 84/14/1935 Referring Provider (PT): Jennings Books K    Encounter Date: 08/16/2020   PT End of Session - 08/16/20 1238    Visit Number 9    Number of Visits 24    Date for PT Re-Evaluation 10/04/20    Authorization Type 9/10 eval 07/12/20    PT Start Time 0845    PT Stop Time 0928    PT Time Calculation (min) 43 min    Equipment Utilized During Treatment Gait belt    Activity Tolerance Patient tolerated treatment well;Patient limited by fatigue    Behavior During Therapy [facility] for tasks assessed/performed           Past Medical History:  Diagnosis Date  . AAA (abdominal aortic aneurysm) ([place]) 05/2019   Relativley Rapid progression from June-Aug 2020 (4.6 - 5.4 cm): s/p EVAR (Dr. Lucky Cowboy [facility]): EVAR - 23 mm prox, 12 cm distal & x 18 cm length Gore Excluder Endoprosthesis Main body (via RFA distal to lowest Renal A); 73m x 14 cm L Contralateral Limb for L Iliac - extended with 8 mm x 6cm LifeStar stent (post-dilated with 7 mm DEB).  Additional R Iliac PTA not required.   . 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 ([place] 09/15/2017   09-15-2017 DIAGNOSIS:  A. COLON POLYP X 2, ASCENDING; COLD SNARE:  - TUBULAR ADENOMA.  - NEGATIVE FOR HIGH-GRADE DYSPLASIA AND MALIGNANCY.  - NO ADDITIONAL TISSUE IDENTIFIED; FECAL MATERIAL PRESENT.   B. COLON POLYP X 2, DESCENDING; COLD SNARE:  - ADENOCARCINOMA ARISING IN A DYSPLASTIC SESSILE SERRATED ADENOMA, WITH  LYMPHOVASCULAR INVASION, SEE COMMENT.  - TUBULAR ADENOMA, 2 FRAGMENTS, NEGATIVE FO  . Arthritis   . Basal cell carcinoma of eyelid 01/03/2013   2019 - R side of Nose  . Benign neoplasm of ascending colon   . Benign neoplasm of descending colon   . Bilateral iliac artery stenosis ([place] 2007   Bilateral  Iliac A stenting; extension of EVAR limbs into both Iliacs -- additional stent placed in L Iliac.  . Cancer ([place]    Skin cancer  . Colon polyps   . Complication of anesthesia    severe confusion agitation requiring hospital admission x 4 days  . Compression fracture of fourth lumbar vertebra ([place] 04/2019   - s/p Kyphoplasty; T12, L3-5 ([facility]  . COPD (chronic obstructive pulmonary disease) (HCC)    Centrilobular Emphysema  . Coronary artery disease involving native coronary artery without angina pectoris 1998   - s/p CABG, occluded SVG-D1; patent LIMA-LAD, SVG-OM, SVG- RCA  . Dementia ([place]    memory loss - started noting late 2019  . Falls frequently    broke foot  . Fracture 2021   left foot  . GERD (gastroesophageal reflux disease)   . Hemorrhoid 02/10/2015  . History of hiatal hernia   . History of kidney stones   . HOH (hard of hearing)   . Hypertension   . Lewy body dementia ([place] 04/04/2020  . Macular degeneration disease   . Osteoporosis   . PAF (paroxysmal atrial fibrillation) ([place]    on Eliquis for OAC - Rate Control   . Prostate cancer ([place]   . Prostate disorder   . Sciatic pain    Chronic  . Sleep apnea  cpap  . Temporary cerebral vascular dysfunction 02/10/2015   Had negative work-up.  July, 2012.  No medication changes, had forgot them that day, got dehydrated.   . Urinary incontinence     Past Surgical History:  Procedure Laterality Date  . ABDOMINAL AORTIC ENDOVASCULAR STENT GRAFT Bilateral 05/18/2019   Procedure: ABDOMINAL AORTIC ENDOVASCULAR STENT GRAFT;  Surgeon: Dew, Jason S, MD;  Location: [facility] ORS;  Service: Vascular;  Laterality: Bilateral;  . ANKLE SURGERY Left    ORIF   . Arch aortogram and carotid aortogram  06/02/2006   Dr Fields did surgery  . BASAL CELL CARCINOMA EXCISION  04/2016   Dermatology  . CARDIAC CATHETERIZATION  01/30/1998    (Dr. H. Smith): Native LAD & mRCA CTO. 90% D1. Mod Cx. SVG-D1 CTO. SVG-RCA & SVG-OM along with  LIMA-LAD patent;  LV NORMAL Fxn  . CAROTID ENDARTERECTOMY Left Oct. 29, 2007   Dr. Fields:  . CATARACT EXTRACTION W/PHACO Right 03/05/2017   Procedure: CATARACT EXTRACTION PHACO AND INTRAOCULAR LENS PLACEMENT (IOC);  Surgeon: Brasington, Chadwick, MD;  Location: [facility] ORS;  Service: Ophthalmology;  Laterality: Right;  US 00:58.5 AP% 10.6 CDE 6.20 Fluid Pack lot # [id number]  . CATARACT EXTRACTION W/PHACO Left 04/15/2017   Procedure: CATARACT EXTRACTION PHACO AND INTRAOCULAR LENS PLACEMENT (IOC)  Left;  Surgeon: Brasington, Chadwick, MD;  [facility];  Service: Ophthalmology;  Laterality: Left;  . COLONOSCOPY WITH PROPOFOL N/A 09/15/2017   Procedure: COLONOSCOPY WITH PROPOFOL;  Surgeon: Wohl, Darren, MD;  Location: [facility];  Service: Endoscopy;  Laterality: N/A;  . COLONOSCOPY WITH PROPOFOL N/A 10/26/2018   Procedure: COLONOSCOPY WITH PROPOFOL;  Surgeon: Wohl, Darren, MD;  Location: [facility];  Service: Endoscopy;  Laterality: N/A;  . CORONARY ARTERY BYPASS GRAFT  1998   LIMA-LAD, SVG-OM, SVG-RPDA, SVG-DIAG  . CPET / MET  12/2012   Mild chronotropic incompetence - read 82% of predicted; also reduced effort; peak VO2 15.7 / 75% (did not reach Max effort) -- suggested ischemic response in last 1.5 minutes of exercise. Normal pulmonary function on PFTs but poor response to  . CYSTOSCOPY W/ RETROGRADES Left 01/13/2020   Procedure: CYSTOSCOPY WITH RETROGRADE PYELOGRAM;  Surgeon: Sninsky, Brian C, MD;  Location: [facility];  Service: Urology;  Laterality: Left;  . CYSTOSCOPY WITH STENT PLACEMENT Left 12/26/2019   Procedure: CYSTOSCOPY WITH STENT PLACEMENT;  Surgeon: Sninsky, Brian C, MD;  Location: [facility];  Service: Urology;  Laterality: Left;  . CYSTOSCOPY/URETEROSCOPY/HOLMIUM LASER/STENT PLACEMENT Left 01/13/2020   Procedure: CYSTOSCOPY/URETEROSCOPY/HOLMIUM LASER/STENT EXCHANGE;  Surgeon: Sninsky, Brian C, MD;  Location: [facility];  Service: Urology;  Laterality: Left;  .  ENDOVASCULAR REPAIR/STENT GRAFT N/A 05/18/2019   Procedure: ENDOVASCULAR REPAIR/STENT GRAFT;  Surgeon: Dew, Jason S, MD;  Location: [facility];; EVAR - 23 mm prox, 12 cm distal & x 18 cm length Gore Excluder Endoprosthesis Main body (via RFA distal to lowest Renal A); 12mm x 14 cm L Contralateral Limb for L Iliac - extended with 8 mm x 6cm LifeStar stent; no additional R Iliac PTA needed.   . ESOPHAGOGASTRODUODENOSCOPY (EGD) WITH PROPOFOL N/A 09/15/2017   Procedure: ESOPHAGOGASTRODUODENOSCOPY (EGD) WITH PROPOFOL;  Surgeon: Wohl, Darren, MD;  Location: [facility];  Service: Endoscopy;  Laterality: N/A;  . FRACTURE SURGERY Right 03/19/2015   wrist  . FRACTURE SURGERY Left   . HIATAL HERNIA REPAIR    . ILIAC ARTERY STENT  12/15/2005   PTA and direct stenting rgt and lft common iliac arteries  . KYPHOPLASTY   N/A 04/05/2019   Procedure: KYPHOPLASTY T12, L3, L4 L5;  Surgeon: Menz, Michael, MD;  Location: [facility];  Service: Orthopedics;  Laterality: N/A;  . NM GATED MYOVIEW ([facility])  05/'19; 8/'20   a) [facility]: Low Risk - no ischemia or infarct.  EF > 65%. no RWMA;; b) ([facility]): Normal wall motion.  No ischemia or infarction.  . OPEN REDUCTION INTERNAL FIXATION (ORIF) DISTAL RADIAL FRACTURE Left 01/13/2019   Procedure: OPEN REDUCTION INTERNAL FIXATION (ORIF) DISTAL  RADIAL FRACTURE - LEFT - SLEEP APNEA;  Surgeon: Menz, Michael, MD;  Location: [facility];  Service: Orthopedics;  Laterality: Left;  . ORIF ELBOW FRACTURE Right 01/01/2017   Procedure: OPEN REDUCTION INTERNAL FIXATION (ORIF) ELBOW/OLECRANON FRACTURE;  Surgeon: Michael Menz, MD;  Location: [facility];  Service: Orthopedics;  Laterality: Right;  . ORIF WRIST FRACTURE Right 03/19/2015   Procedure: OPEN REDUCTION INTERNAL FIXATION (ORIF) WRIST FRACTURE;  Surgeon: Michael Menz, MD;  Location: [facility];  Service: Orthopedics;  Laterality: Right;  . SHOULDER ARTHROSCOPY Right   . THORACIC AORTA - CAROTID ANGIOGRAM  October 2007   Dr.  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 TRANSTHORACIC ECHOCARDIOGRAM  February 2016   [facility]: Normal LV function. Dilated left atrium.  . TRANSTHORACIC ECHOCARDIOGRAM  04/2019   ([facility] April 20, 2019): EF 55-60 %.  Mild LVH.  Relatively normal valves.    There were no vitals filed for this visit.   Subjective Assessment - 08/16/20 0850    Subjective Patient reports feeling "off today" no pain or falls since last session.    Pertinent History Patient is returning to PT for evaluation of balance. Patient has been seen by this therapist in the past. PMH AAA, arthritis, basal cell carcinoma of eyelid, benign neoplasm of ascending/descending colon, bilateral iliac artery stenosis, colon polyps, compression fx of 4th lumbar vertebra (s/p kyphoplasty T12, L3-5), COPD, CAD, dementia, falls,  GERD, hemorroids, HOH, HTN, Lewy Body Dementia, macular degeneration disease, osteoporosis, PAF, prostate cancer, prostate disorder, sciatic pain, sleep apnea, temporary cerebral vascular dysfunction. Comes with aide, having multiple near falls per day per aide, mainly to the sides.    Limitations Walking;Standing;House hold activities;Other (comment);Lifting;Sitting    How long can you sit comfortably? n/a back pain no longer limiting    How long can you stand comfortably? 10-12 with walker    How long can you walk comfortably? walks 15 minutes with a walker    Diagnostic tests MRI from [facility] dated 03/28/2019 report was reviewed today. Acute compression fractures are noted at T12, L3, and L4. He has facet arthropathy at the lower lumbar levels. At L3-4 he has moderate central stenosis.    Patient Stated Goals walk without stumbling, be able to be steady enough to not have to have someone holding his gait belt.    Currently in Pain? No/denies              Entire session performed in target  of motor control training, spatial awareness. Pt purposefully given interventions to create frequent LOB to allow for high volume practice of righting strategies with physical assistance provided only as needed. Entire session performed with gait belt donned and minGuard assist of patient.  Patient is confused and agitated throughout session. As he became more confused and agitated blood pressure re-checked.    BP at start of session: 94/45   BP after a few exercises:R arm 91/50   L arm.    TherEx: Nustep Lvl 4 RPM> 40 for cardiovascular and musculoskeletal challenge 4 minutes    Sit to stands 10x    Standing with # 4 ankle weight: CGA for stability -Hip extension with BUE upper extremity support, cueing for neutral hip alignment, upright posture for optimal muscle recruitment, and sequencing, 10x each LE,  -Hip abduction with BUE upper extremity support, cueing for neutral foot alignment for correct muscle activation, 10x each LE -Hip flexion with BUE upper extremity support, cueing for body mechanics, speed of muscle recruitment for optimal strengthening and stabilization 10x each LE  ambulate with RW 98 ft with cues for upright posture and large step length   Precor leg press: Bilateral LE: #55, 10x, 2 sets ; cues for control and velocity for optimal muscle recruitment    Neuro Re-ed:  airex pad: -obtain ball from package, throw at target for pertubations x 14 balls.  -6" step modified tandem stance 40 second holds, more challenging with LLE posteriorly positioned.   Seated ballloon taps reaching for stabilization and sequencing x2 minutes  BP at end of session: 106/58       Pt educated throughout session about proper posture and technique with exercises. Improved exercise technique, movement at target joints, use of target muscles after min to mod verbal, visual, tactile cues.                            PT Education - 08/16/20 0857    Education provided Yes     Education Details exercise technique, body mechanics    Person(s) Educated Patient    Methods Explanation;Tactile cues;Demonstration;Verbal cues    Comprehension Verbalized understanding;Returned demonstration;Verbal cues required;Tactile cues required            PT Short Term Goals - 07/12/20 0959      PT SHORT TERM GOAL #1   Title Patient will be independent in home exercise program to improve strength/mobility for better functional independence with ADLs.    Baseline 11/11: HEP given    Time 4    Period Weeks    Status New    Target Date 08/09/20             PT Long Term Goals - 07/12/20 0959      PT LONG TERM GOAL #1   Title Patient will increase FOTO score to equal to or greater than 51% to demonstrate statistically significant improvement in mobility and quality of life.    Baseline 11/11: 42%    Time 12    Period Weeks    Status New    Target Date 10/04/20      PT LONG TERM GOAL #2   Title Patient will demonstrate an improved Berg Balance Score of > 40/56 as to demonstrate improved balance with ADLs such as sitting/standing and transfer balance and reduced fall risk.    Baseline 11/11: 34/56    Time 12    Period Weeks    Status New    Target Date 10/04/20      PT LONG TERM GOAL #3   Title Patient (> 60 years old) will complete five times sit to stand test in < 15 seconds without UE support indicating an increased LE strength and improved balance    Baseline 11/11; 23.35 seconds with UE support    Time 12    Period Weeks    Status New    Target Date 10/04/20        PT LONG TERM GOAL #4   Title Patient will increase 10 meter walk test to >1.0m/s with LRAD  as to improve gait speed for better community ambulation and to reduce fall risk.    Baseline 11/11: 0.74 m/s without AD with one near LOB    Time 12    Period Weeks    Status New    Target Date 10/04/20      PT LONG TERM GOAL #5   Title Patient will increase BLE gross strength to 4+/5 as to improve  functional strength for independent gait, increased standing tolerance and increased ADL ability.    Baseline 11/11: see note    Time 12    Period Weeks    Status New    Target Date 10/04/20                 Plan - 08/16/20 1307    Clinical Impression Statement Patient is confused and agitated throughout session. His blood pressure and vitals were monitored throughout the session with them remaining in therapeutic ranges. Patient requires max cueing for task orientation and frequently became agitated and abrupt which is not per usual behavior. Patient will benefit from skilled physical therapy to increase strength, balance, and mobility to allow patient to reduce fall risk and increase independence in daily life.    Personal Factors and Comorbidities Age;Comorbidity 3+;Fitness;Past/Current Experience;Sex;Social Background;Time since onset of injury/illness/exacerbation;Transportation    Comorbidities AAA, arthritis, atherosclerotic heart disease, (s/p CABG), benign neoplasm of ascending/descending colon, COPD, CAD, dementia, dysrhythmia, frequent falls, GERD, hemorrhoid, HOH, HTN, PAF, prostate cancer.    Examination-Activity Limitations Bathing;Bend;Carry;Dressing;Continence;Stairs;Squat;Reach Overhead;Locomotion Level;Lift;Stand;Transfers;Toileting;Bed Mobility    Examination-Participation Restrictions Church;Cleaning;Community Activity;Driving;Interpersonal Relationship;Laundry;Volunteer;Shop;Personal Finances;Meal Prep;Yard Work    Stability/Clinical Decision Making Evolving/Moderate complexity    Rehab Potential Fair    Clinical Impairments Affecting Rehab Potential (+) good support system, high prior level of function (-) currently undergoing radiation therapy for prostate cancer, memory deficits     PT Frequency 2x / week    PT Duration 12 weeks    PT Treatment/Interventions ADLs/Self Care Home Management;Aquatic Therapy;Cryotherapy;Moist Heat;Traction;Ultrasound;Therapeutic  activities;Functional mobility training;Stair training;Gait training;DME Instruction;Therapeutic exercise;Balance training;Neuromuscular re-education;Patient/family education;Manual techniques;Passive range of motion;Energy conservation;Vestibular;Taping;Biofeedback;Electrical Stimulation;Iontophoresis 4mg/ml Dexamethasone;Dry needling;Visual/perceptual remediation/compensation;Cognitive remediation;Canalith Repostioning    PT Next Visit Plan balance, strength    PT Home Exercise Plan see above    Consulted and Agree with Plan of Care Patient;Family member/caregiver    Family Member Consulted aide           Patient will benefit from skilled therapeutic intervention in order to improve the following deficits and impairments:  Abnormal gait,Cardiopulmonary status limiting activity,Decreased activity tolerance,Decreased balance,Decreased knowledge of precautions,Decreased endurance,Decreased coordination,Decreased cognition,Decreased knowledge of use of DME,Decreased mobility,Difficulty walking,Decreased safety awareness,Decreased strength,Impaired flexibility,Impaired perceived functional ability,Impaired sensation,Postural dysfunction,Improper body mechanics,Pain,Impaired vision/preception,Decreased range of motion  Visit Diagnosis: Unsteadiness on feet  Muscle weakness (generalized)  Other abnormalities of gait and mobility  Chronic low back pain, unspecified back pain laterality, unspecified whether sciatica present     Problem List Patient Active Problem List   Diagnosis Date Noted  . AAA (abdominal aortic aneurysm) without rupture (HCC) 06/29/2020  . LBD (Lewy body dementia) (HCC) 05/14/2020  . Numbness and tingling in left hand 03/02/2020  . Hematuria   . Acute kidney injury superimposed on CKD (HCC)   . Sepsis due to gram-negative UTI (HCC) 12/26/2019  . Left nephrolithiasis 12/26/2019  . Hydronephrosis, left 12/26/2019  . Status post abdominal aortic aneurysm (AAA) repair  04/15/2019  .   Do not intubate, cardiopulmonary resuscitation (CPR)-only code status 10/08/2018  . Prostate cancer (HCC) 02/25/2018  . Prostatic cyst 01/15/2018  . Blood in stool   . Abnormal feces   . Stricture and stenosis of esophagus   . Mild cognitive impairment 08/18/2017  . Senile purpura (HCC) 04/29/2017  . Anemia 04/29/2017  . Frequent falls 04/29/2017  . Simple chronic bronchitis (HCC) 12/17/2016  . Benign localized hyperplasia of prostate with urinary obstruction 07/15/2016  . Elevated PSA 07/15/2016  . Incomplete emptying of bladder 07/15/2016  . Urge incontinence 07/15/2016  . Hypothyroidism 04/25/2016  . Macular degeneration 04/25/2016  . Erectile dysfunction due to arterial insufficiency   . Ischemic heart disease with chronotropic incompetence   . CN (constipation) 02/10/2015  . DD (diverticular disease) 02/10/2015  . Weakness 02/10/2015  . Lichen planus 02/10/2015  . Restless leg 02/10/2015  . 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 B12 deficiency 02/10/2015  . COPD (chronic obstructive pulmonary disease) (HCC) 11/14/2014  . Post Inflammatory Lung Changes 11/14/2014  . PAF (paroxysmal atrial fibrillation) (HCC) CHA2DS2-VASc = 4. AC = Eliquis   . Insomnia 12/09/2013  . OSA on CPAP   . Dyspnea on exertion - -essentially resolved with BB dose reduction & wgt loss 03/29/2013  . Overweight (BMI 25.0-29.9) -- Wgt back up 01/17/2013  . Hx of CABG   . Essential hypertension   . Hyperlipidemia with target LDL less than 70   . Aorto-iliac disease (HCC)   . BCC (basal cell carcinoma), eyelid 01/03/2013  . Allergic rhinitis 08/17/2009  . Adaptation reaction 05/28/2009  . Acid reflux 05/28/2009  . Arthritis, degenerative 05/28/2009  . 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 Carotid artery occlusion without infarction 06/01/2006  . PAD (peripheral artery disease) - bilateral common iliac stents 12/15/2005  . Atherosclerotic heart disease of artery bypass graft  09/01/1997    , PT, DPT    08/16/2020, 1:08 PM   [facility] [address] [place], [place], [address] Phone: [phone]   Fax:  [phone]  Name: Walther E Merica MRN: [id number] Date of Birth: 11/05/1933   

## 2020-08-21 ENCOUNTER — Other Ambulatory Visit: Payer: Self-pay

## 2020-08-21 ENCOUNTER — Ambulatory Visit: Payer: Medicare Other

## 2020-08-21 DIAGNOSIS — R2681 Unsteadiness on feet: Secondary | ICD-10-CM | POA: Diagnosis not present

## 2020-08-21 DIAGNOSIS — M6281 Muscle weakness (generalized): Secondary | ICD-10-CM

## 2020-08-21 DIAGNOSIS — R2689 Other abnormalities of gait and mobility: Secondary | ICD-10-CM

## 2020-08-21 NOTE — Therapy (Signed)
[place] MAIN [facility] SERVICES [address] [place], [place], [address] Phone: [phone]   Fax:  [phone]  Physical Therapy Treatment Physical Therapy Progress Note   Dates of reporting period  07/12/20   to  08/21/20  Patient Details  Name: [name] MRN: [id number] Date of Birth: [birth year] Referring Provider (PT): Jennings Books K    Encounter Date: 08/21/2020   PT End of Session - 08/21/20 0952    Visit Number 10    Number of Visits 24    Date for PT Re-Evaluation 10/04/20    Authorization Type 10/10 eval 07/12/20; next session 1/10 Pn 08/21/20    PT Start Time 0848    PT Stop Time 0931    PT Time Calculation (min) 43 min    Equipment Utilized During Treatment Gait belt    Activity Tolerance Patient tolerated treatment well;Patient limited by fatigue    Behavior During Therapy WFL for tasks assessed/performed           Past Medical History:  Diagnosis Date  . AAA (abdominal aortic aneurysm) ([place]) 05/2019   Relativley Rapid progression from June-Aug 2020 (4.6 - 5.4 cm): s/p EVAR (Dr. Lucky Cowboy [facility]): EVAR - 23 mm prox, 12 cm distal & x 18 cm length Gore Excluder Endoprosthesis Main body (via RFA distal to lowest Renal A); 18m x 14 cm L Contralateral Limb for L Iliac - extended with 8 mm x 6cm LifeStar stent (post-dilated with 7 mm DEB).  Additional R Iliac PTA not required.   . 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 ([place] 09/15/2017   09-15-2017 DIAGNOSIS:  A. COLON POLYP X 2, ASCENDING; COLD SNARE:  - TUBULAR ADENOMA.  - NEGATIVE FOR HIGH-GRADE DYSPLASIA AND MALIGNANCY.  - NO ADDITIONAL TISSUE IDENTIFIED; FECAL MATERIAL PRESENT.   B. COLON POLYP X 2, DESCENDING; COLD SNARE:  - ADENOCARCINOMA ARISING IN A DYSPLASTIC SESSILE SERRATED ADENOMA, WITH  LYMPHOVASCULAR INVASION, SEE COMMENT.  - TUBULAR ADENOMA, 2 FRAGMENTS, NEGATIVE FO  . Arthritis   . Basal cell carcinoma of eyelid 01/03/2013   2019 - R side of Nose  . Benign neoplasm of  ascending colon   . Benign neoplasm of descending colon   . Bilateral iliac artery stenosis ([place] 2007   Bilateral Iliac A stenting; extension of EVAR limbs into both Iliacs -- additional stent placed in L Iliac.  . Cancer ([place]    Skin cancer  . Colon polyps   . Complication of anesthesia    severe confusion agitation requiring hospital admission x 4 days  . Compression fracture of fourth lumbar vertebra ([place] 04/2019   - s/p Kyphoplasty; T12, L3-5 ([facility]  . COPD (chronic obstructive pulmonary disease) (HCC)    Centrilobular Emphysema  . Coronary artery disease involving native coronary artery without angina pectoris 1998   - s/p CABG, occluded SVG-D1; patent LIMA-LAD, SVG-OM, SVG- RCA  . Dementia ([place]    memory loss - started noting late 2019  . Falls frequently    broke foot  . Fracture 2021   left foot  . GERD (gastroesophageal reflux disease)   . Hemorrhoid 02/10/2015  . History of hiatal hernia   . History of kidney stones   . HOH (hard of hearing)   . Hypertension   . Lewy body dementia ([place] 04/04/2020  . Macular degeneration disease   . Osteoporosis   . PAF (paroxysmal atrial fibrillation) ([place]    on Eliquis for OAC - Rate Control   . Prostate cancer (  HCC)   . Prostate disorder   . Sciatic pain    Chronic  . Sleep apnea    cpap  . Temporary cerebral vascular dysfunction 02/10/2015   Had negative work-up.  July, 2012.  No medication changes, had forgot them that day, got dehydrated.   . Urinary incontinence     Past Surgical History:  Procedure Laterality Date  . ABDOMINAL AORTIC ENDOVASCULAR STENT GRAFT Bilateral 05/18/2019   Procedure: ABDOMINAL AORTIC ENDOVASCULAR STENT GRAFT;  Surgeon: Algernon Huxley, MD;  Location: [facility] ORS;  Service: Vascular;  Laterality: Bilateral;  . ANKLE SURGERY Left    ORIF   . Arch aortogram and carotid aortogram  06/02/2006   Dr Oneida Alar did surgery  . BASAL CELL CARCINOMA EXCISION  04/2016   Dermatology  . CARDIAC  CATHETERIZATION  01/30/1998    (Dr. Linard Millers): Native LAD & mRCA CTO. 90% D1. Mod Cx. SVG-D1 CTO. SVG-RCA & SVG-OM along with LIMA-LAD patent;  LV NORMAL Fxn  . CAROTID ENDARTERECTOMY Left Oct. 29, 2007   Dr. Oneida Alar:  . CATARACT EXTRACTION W/PHACO Right 03/05/2017   Procedure: CATARACT EXTRACTION PHACO AND INTRAOCULAR LENS PLACEMENT (IOC);  Surgeon: Leandrew Koyanagi, MD;  Location: [facility] ORS;  Service: Ophthalmology;  Laterality: Right;  [place] 00:58.5 AP% 10.6 CDE 6.20 Fluid Pack lot # [id number]  . CATARACT EXTRACTION W/PHACO Left 04/15/2017   Procedure: CATARACT EXTRACTION PHACO AND INTRAOCULAR LENS PLACEMENT ([place])  Left;  Surgeon: Leandrew Koyanagi, MD;  Location: [place];  Service: Ophthalmology;  Laterality: Left;  . COLONOSCOPY WITH PROPOFOL N/A 09/15/2017   Procedure: COLONOSCOPY WITH PROPOFOL;  Surgeon: Lucilla Lame, MD;  Location: [facility] ENDOSCOPY;  Service: Endoscopy;  Laterality: N/A;  . COLONOSCOPY WITH PROPOFOL N/A 10/26/2018   Procedure: COLONOSCOPY WITH PROPOFOL;  Surgeon: Lucilla Lame, MD;  Location: [facility] ENDOSCOPY;  Service: Endoscopy;  Laterality: N/A;  . CORONARY ARTERY BYPASS GRAFT  1998   LIMA-LAD, SVG-OM, SVG-RPDA, SVG-DIAG  . CPET / MET  12/2012   Mild chronotropic incompetence - read 82% of predicted; also reduced effort; peak VO2 15.7 / 75% (did not reach Max effort) -- suggested ischemic response in last 1.5 minutes of exercise. Normal pulmonary function on PFTs but poor response to  . CYSTOSCOPY W/ RETROGRADES Left 01/13/2020   Procedure: CYSTOSCOPY WITH RETROGRADE PYELOGRAM;  Surgeon: Billey Co, MD;  Location: [facility] ORS;  Service: Urology;  Laterality: Left;  . CYSTOSCOPY WITH STENT PLACEMENT Left 12/26/2019   Procedure: CYSTOSCOPY WITH STENT PLACEMENT;  Surgeon: Billey Co, MD;  Location: [facility] ORS;  Service: Urology;  Laterality: Left;  . CYSTOSCOPY/URETEROSCOPY/HOLMIUM LASER/STENT PLACEMENT Left 01/13/2020   Procedure:  CYSTOSCOPY/URETEROSCOPY/HOLMIUM LASER/STENT EXCHANGE;  Surgeon: Billey Co, MD;  Location: [facility] ORS;  Service: Urology;  Laterality: Left;  . ENDOVASCULAR REPAIR/STENT GRAFT N/A 05/18/2019   Procedure: ENDOVASCULAR REPAIR/STENT GRAFT;  Surgeon: Algernon Huxley, MD;  Location: [facility] INVASIVE CV LAB;; EVAR - 23 mm prox, 12 cm distal & x 18 cm length Gore Excluder Endoprosthesis Main body (via RFA distal to lowest Renal A); 59m x 14 cm L Contralateral Limb for L Iliac - extended with 8 mm x 6cm LifeStar stent; no additional R Iliac PTA needed.   . ESOPHAGOGASTRODUODENOSCOPY (EGD) WITH PROPOFOL N/A 09/15/2017   Procedure: ESOPHAGOGASTRODUODENOSCOPY (EGD) WITH PROPOFOL;  Surgeon: WLucilla Lame MD;  [facility];  Service: Endoscopy;  Laterality: N/A;  . FRACTURE SURGERY Right 03/19/2015   wrist  . FRACTURE SURGERY Left   . HIATAL HERNIA REPAIR    .  ILIAC ARTERY STENT  12/15/2005   PTA and direct stenting rgt and lft common iliac arteries  . KYPHOPLASTY N/A 04/05/2019   Procedure: KYPHOPLASTY T12, L3, L4 L5;  Surgeon: Hessie Knows, MD;  Location: [facility] ORS;  Service: Orthopedics;  Laterality: N/A;  . NM GATED MYOVIEW ([place] HX)  05/'19; 8/'20   a) [facility]: Low Risk - no ischemia or infarct.  EF > 65%. no RWMA;; b) [facility]): Normal wall motion.  No ischemia or infarction.  . OPEN REDUCTION INTERNAL FIXATION (ORIF) DISTAL RADIAL FRACTURE Left 01/13/2019   Procedure: OPEN REDUCTION INTERNAL FIXATION (ORIF) DISTAL  RADIAL FRACTURE - LEFT - SLEEP APNEA;  Surgeon: Hessie Knows, MD;  Location: [facility] ORS;  Service: Orthopedics;  Laterality: Left;  . ORIF ELBOW FRACTURE Right 01/01/2017   Procedure: OPEN REDUCTION INTERNAL FIXATION (ORIF) ELBOW/OLECRANON FRACTURE;  Surgeon: Hessie Knows, MD;  Location: [facility] ORS;  Service: Orthopedics;  Laterality: Right;  . ORIF WRIST FRACTURE Right 03/19/2015   Procedure: OPEN REDUCTION INTERNAL FIXATION (ORIF) WRIST FRACTURE;  Surgeon: Hessie Knows, MD;  Location:  [facility] ORS;  Service: Orthopedics;  Laterality: Right;  . SHOULDER ARTHROSCOPY Right   . THORACIC AORTA - CAROTID ANGIOGRAM  October 2007   Dr. 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 TRANSTHORACIC ECHOCARDIOGRAM  February 2016   [facility]: Normal LV function. Dilated left atrium.  Domingo Dimes ECHOCARDIOGRAM  04/2019   [facility] April 20, 2019): EF 55-60 %.  Mild LVH.  Relatively normal valves.    There were no vitals filed for this visit.   Subjective Assessment - 08/21/20 0941    Subjective Patient presents with his aide this morning. Reports no falls since last session. Son helped patient to get dressed today.    Pertinent History Patient is returning to PT for evaluation of balance. Patient has been seen by this therapist in the past. PMH AAA, arthritis, basal cell carcinoma of eyelid, benign neoplasm of ascending/descending colon, bilateral iliac artery stenosis, colon polyps, compression fx of 4th lumbar vertebra (s/p kyphoplasty T12, L3-5), COPD, CAD, dementia, falls,  GERD, hemorroids, HOH, HTN, Lewy Body Dementia, macular degeneration disease, osteoporosis, PAF, prostate cancer, prostate disorder, sciatic pain, sleep apnea, temporary cerebral vascular dysfunction. Comes with aide, having multiple near falls per day per aide, mainly to the sides.    Limitations Walking;Standing;House hold activities;Other (comment);Lifting;Sitting    How long can you sit comfortably? n/a back pain no longer limiting    How long can you stand comfortably? 10-12 with walker    How long can you walk comfortably? walks 15 minutes with a walker    Diagnostic tests MRI from [place] dated 03/28/2019 report was reviewed today. Acute compression fractures are noted at T12, L3, and L4. He has facet arthropathy at the lower lumbar levels. At L3-4 he has moderate central stenosis.    Patient Stated  Goals walk without stumbling, be able to be steady enough to not have to have someone holding his gait belt.    Currently in Pain? No/denies            Blood pressure 129/58     [facility] PT Assessment - 08/21/20 0001      Standardized Balance Assessment   Standardized Balance Assessment Berg Balance Test      Berg Balance Test   Sit to Stand Able to stand without using hands and stabilize independently    Standing Unsupported Able  to stand 2 minutes with supervision    Sitting with Back Unsupported but Feet Supported on Floor or Stool Able to sit safely and securely 2 minutes    Stand to Sit Controls descent by using hands    Transfers Able to transfer safely, definite need of hands    Standing Unsupported with Eyes Closed Able to stand 10 seconds with supervision    Standing Unsupported with Feet Together Able to place feet together independently but unable to hold for 30 seconds    From Standing, Reach Forward with Outstretched Arm Can reach forward >12 cm safely (5")    From Standing Position, Pick up Object from Floor Able to pick up shoe, needs supervision    From Standing Position, Turn to Look Behind Over each Shoulder Looks behind one side only/other side shows less weight shift    Turn 360 Degrees Able to turn 360 degrees safely but slowly    Standing Unsupported, Alternately Place Feet on Step/Stool Able to complete 4 steps without aid or supervision    Standing Unsupported, One Foot in [place] to take small step independently and hold 30 seconds    Standing on One Leg Unable to try or needs assist to prevent fall    Total Score 37              Goals:   FOTO: 46.8 %  BERG: 37/56  5x STS : 27.85 with one near LOB  10 MWT 9.68 seconds with rollator    Treatment:  airex pad: modified tandem stance 2x 30 seconds each LE with 6" step airex pad throw balls at target. X 16 balls, patient to hold bag, reach into bag and then throw target.  4lb ankle weights  : -walking marches with cues for increased hip flexion; BUE support 4x length of // bars -hip abduction 12x each LE, cues for upright posture -hip extension 10x each LE, cues for larger step length   Seated: 4lb ankle weight:  -marches with arms crossed 10x -LAQ with arms crossed 10x each LE    Patient's condition has the potential to improve in response to therapy. Maximum improvement is yet to be obtained. The anticipated improvement is attainable and reasonable in a generally predictable time.  Patient reports he is not where he wants to be yet, wants to be able to walk without someone holding his gait belt.                 PT Education - 08/21/20 0946    Education provided Yes    Education Details goals, POC, exercise technique    Person(s) Educated Patient    Methods Explanation;Demonstration;Tactile cues;Verbal cues    Comprehension Verbalized understanding;Returned demonstration;Verbal cues required;Tactile cues required            PT Short Term Goals - 08/21/20 0959      PT SHORT TERM GOAL #1   Title Patient will be independent in home exercise program to improve strength/mobility for better functional independence with ADLs.    Baseline 11/11: HEP given 12/21: HEP compliant    Time 4    Period Weeks    Status Achieved    Target Date 08/09/20             PT Long Term Goals - 08/21/20 0958      PT LONG TERM GOAL #1   Title Patient will increase FOTO score to equal to or greater than 51% to demonstrate statistically significant improvement in  mobility and quality of life.    Baseline 11/11: 42% 12/21: 46.8%    Time 12    Period Weeks    Status Partially Met    Target Date 10/04/20      PT LONG TERM GOAL #2   Title Patient will demonstrate an improved Berg Balance Score of > 40/56 as to demonstrate improved balance with ADLs such as sitting/standing and transfer balance and reduced fall risk.    Baseline 11/11: 34/56 12/21: 37/56    Time 12     Period Weeks    Status Partially Met    Target Date 10/04/20      PT LONG TERM GOAL #3   Title Patient (> 16 years old) will complete five times sit to stand test in < 15 seconds without UE support indicating an increased LE strength and improved balance    Baseline 11/11; 23.35 seconds with UE support 12/21: 27.85 with one near LOB    Time 12    Period Weeks    Status On-going    Target Date 10/04/20      PT LONG TERM GOAL #4   Title Patient will increase 10 meter walk test to >1.3ms with LRAD  as to improve gait speed for better community ambulation and to reduce fall risk.    Baseline 11/11: 0.74 m/s without AD with one near LOB 12/21: 1.08 m/s    Time 12    Period Weeks    Status Achieved      PT LONG TERM GOAL #5   Title Patient will increase BLE gross strength to 4+/5 as to improve functional strength for independent gait, increased standing tolerance and increased ADL ability.    Baseline 11/11: see note    Time 12    Period Weeks    Status Partially Met    Target Date 10/04/20                 Plan - 08/21/20 0957    Clinical Impression Statement Patient is progressing towards functional goals with BERG and 10 MWT improving. Patient does have occasional posterior LOB when distracted or attempted dual task. Patient progressing with functional mobility at this time and will benefit from continued focus. Patient's condition has the potential to improve in response to therapy. Maximum improvement is yet to be obtained. The anticipated improvement is attainable and reasonable in a generally predictable time. Patient will benefit from skilled physical therapy to increase strength, balance, and mobility to allow patient to reduce fall risk and increase independence in daily life.    Personal Factors and Comorbidities Age;Comorbidity 3+;Fitness;Past/Current Experience;Sex;Social Background;Time since onset of injury/illness/exacerbation;Transportation    Comorbidities AAA,  arthritis, atherosclerotic heart disease, (s/p CABG), benign neoplasm of ascending/descending colon, COPD, CAD, dementia, dysrhythmia, frequent falls, GERD, hemorrhoid, HOH, HTN, PAF, prostate cancer.    Examination-Activity Limitations Bathing;Bend;Carry;Dressing;Continence;Stairs;Squat;Reach Overhead;Locomotion Level;Lift;Stand;Transfers;Toileting;Bed Mobility    Examination-Participation Restrictions Church;Cleaning;Community Activity;Driving;Interpersonal Relationship;Laundry;Volunteer;Shop;Personal Finances;Meal Prep;Yard Work    SProduct manager   Rehab Potential Fair    Clinical Impairments Affecting Rehab Potential (+) good support system, high prior level of function (-) currently undergoing radiation therapy for prostate cancer, memory deficits     PT Frequency 2x / week    PT Duration 12 weeks    PT Treatment/Interventions ADLs/Self Care Home Management;Aquatic Therapy;Cryotherapy;Moist Heat;Traction;Ultrasound;Therapeutic activities;Functional mobility training;Stair training;Gait training;DME Instruction;Therapeutic exercise;Balance training;Neuromuscular re-education;Patient/family education;Manual techniques;Passive range of motion;Energy conservation;Vestibular;Taping;Biofeedback;Electrical Stimulation;Iontophoresis 417mml Dexamethasone;Dry needling;Visual/perceptual remediation/compensation;Cognitive remediation;Canalith Repostioning    PT Next  Visit Plan balance, strength    PT Home Exercise Plan see above    Consulted and Agree with Plan of Care Patient;Family member/caregiver    Family Member Consulted aide           Patient will benefit from skilled therapeutic intervention in order to improve the following deficits and impairments:  Abnormal gait,Cardiopulmonary status limiting activity,Decreased activity tolerance,Decreased balance,Decreased knowledge of precautions,Decreased endurance,Decreased coordination,Decreased  cognition,Decreased knowledge of use of DME,Decreased mobility,Difficulty walking,Decreased safety awareness,Decreased strength,Impaired flexibility,Impaired perceived functional ability,Impaired sensation,Postural dysfunction,Improper body mechanics,Pain,Impaired vision/preception,Decreased range of motion  Visit Diagnosis: Unsteadiness on feet  Muscle weakness (generalized)  Other abnormalities of gait and mobility     Problem List Patient Active Problem List   Diagnosis Date Noted  . AAA (abdominal aortic aneurysm) without rupture ([place]) 06/29/2020  . LBD (Lewy body dementia) ([place]) 05/14/2020  . Numbness and tingling in left hand 03/02/2020  . Hematuria   . Acute kidney injury superimposed on CKD ([place])   . Sepsis due to gram-negative UTI ([place]) 12/26/2019  . Left nephrolithiasis 12/26/2019  . Hydronephrosis, left 12/26/2019  . Status post abdominal aortic aneurysm (AAA) repair 04/15/2019  . Do not intubate, cardiopulmonary resuscitation (CPR)-only code status 10/08/2018  . Prostate cancer ([place]) 02/25/2018  . Prostatic cyst 01/15/2018  . Blood in stool   . Abnormal feces   . Stricture and stenosis of esophagus   . Mild cognitive impairment 08/18/2017  . Senile purpura ([place]) 04/29/2017  . Anemia 04/29/2017  . Frequent falls 04/29/2017  . Simple chronic bronchitis ([place]) 12/17/2016  . Benign localized hyperplasia of prostate with urinary obstruction 07/15/2016  . Elevated PSA 07/15/2016  . Incomplete emptying of bladder 07/15/2016  . Urge incontinence 07/15/2016  . Hypothyroidism 04/25/2016  . Macular degeneration 04/25/2016  . Erectile dysfunction due to arterial insufficiency   . Ischemic heart disease with chronotropic incompetence   . CN (constipation) 02/10/2015  . DD (diverticular disease) 02/10/2015  . Weakness 02/10/2015  . Lichen planus 53/64/6803  . Restless leg 02/10/2015  . 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 B12 deficiency 02/10/2015  . COPD (chronic  obstructive pulmonary disease) ([place]) 11/14/2014  . Post Inflammatory Lung Changes 11/14/2014  . PAF (paroxysmal atrial fibrillation) (HCC) CHA2DS2-VASc = 4. AC = Eliquis   . Insomnia 12/09/2013  . OSA on CPAP   . Dyspnea on exertion - -essentially resolved with BB dose reduction & wgt loss 03/29/2013  . Overweight (BMI 25.0-29.9) -- Wgt back up 01/17/2013  . Hx of CABG   . Essential hypertension   . Hyperlipidemia with target LDL less than 70   . Aorto-iliac disease ([place])   . BCC (basal cell carcinoma), eyelid 01/03/2013  . Allergic rhinitis 08/17/2009  . Adaptation reaction 05/28/2009  . Acid reflux 05/28/2009  . Arthritis, degenerative 05/28/2009  . 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 Carotid artery occlusion without infarction 06/01/2006  . PAD (peripheral artery disease) - bilateral common iliac stents 12/15/2005  . Atherosclerotic heart disease of artery bypass graft 09/01/1997   Janna Arch, PT, DPT   08/21/2020, 10:00 AM  [place] MAIN [facility] SERVICES [address] [place], [place], [address] Phone: [phone]   Fax:  [phone]  Name: LEXIE MORINI MRN: [id number] Date of Birth: March 28, 1934

## 2020-08-23 ENCOUNTER — Other Ambulatory Visit: Payer: Self-pay

## 2020-08-23 ENCOUNTER — Ambulatory Visit: Payer: Medicare Other

## 2020-08-23 DIAGNOSIS — M6281 Muscle weakness (generalized): Secondary | ICD-10-CM

## 2020-08-23 DIAGNOSIS — R2681 Unsteadiness on feet: Secondary | ICD-10-CM

## 2020-08-23 DIAGNOSIS — G8929 Other chronic pain: Secondary | ICD-10-CM

## 2020-08-23 DIAGNOSIS — R2689 Other abnormalities of gait and mobility: Secondary | ICD-10-CM

## 2020-08-23 DIAGNOSIS — R262 Difficulty in walking, not elsewhere classified: Secondary | ICD-10-CM

## 2020-08-23 DIAGNOSIS — M545 Low back pain, unspecified: Secondary | ICD-10-CM

## 2020-08-23 NOTE — Therapy (Signed)
[place] MAIN [facility] SERVICES [address] [place], [place], [address] Phone: [phone]   Fax:  [phone]  Physical Therapy Treatment  Patient Details  Name: [name] MRN: [id number] Date of Birth: 03/13/1934 Referring Provider (PT): Jennings Books K    Encounter Date: 08/23/2020   PT End of Session - 08/23/20 1031    Visit Number 9    Number of Visits 24    Date for PT Re-Evaluation 10/04/20    PT Start Time 0845    PT Stop Time 0925    PT Time Calculation (min) 40 min    Equipment Utilized During Treatment Gait belt    Activity Tolerance Patient tolerated treatment well;Patient limited by fatigue    Behavior During Therapy WFL for tasks assessed/performed           Past Medical History:  Diagnosis Date  . AAA (abdominal aortic aneurysm) ([place]) 05/2019   Relativley Rapid progression from June-Aug 2020 (4.6 - 5.4 cm): s/p EVAR (Dr. Lucky Cowboy [facility]): EVAR - 23 mm prox, 12 cm distal & x 18 cm length Gore Excluder Endoprosthesis Main body (via RFA distal to lowest Renal A); 62mm x 14 cm L Contralateral Limb for L Iliac - extended with 8 mm x 6cm LifeStar stent (post-dilated with 7 mm DEB).  Additional R Iliac PTA not required.   . 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 ([place]) 09/15/2017   09-15-2017 DIAGNOSIS:  A. COLON POLYP X 2, ASCENDING; COLD SNARE:  - TUBULAR ADENOMA.  - NEGATIVE FOR HIGH-GRADE DYSPLASIA AND MALIGNANCY.  - NO ADDITIONAL TISSUE IDENTIFIED; FECAL MATERIAL PRESENT.   B. COLON POLYP X 2, DESCENDING; COLD SNARE:  - ADENOCARCINOMA ARISING IN A DYSPLASTIC SESSILE SERRATED ADENOMA, WITH  LYMPHOVASCULAR INVASION, SEE COMMENT.  - TUBULAR ADENOMA, 2 FRAGMENTS, NEGATIVE FO  . Arthritis   . Basal cell carcinoma of eyelid 01/03/2013   2019 - R side of Nose  . Benign neoplasm of ascending colon   . Benign neoplasm of descending colon   . Bilateral iliac artery stenosis ([place]) 2007   Bilateral Iliac A stenting; extension of EVAR limbs into  both Iliacs -- additional stent placed in L Iliac.  . Cancer ([place])    Skin cancer  . Colon polyps   . Complication of anesthesia    severe confusion agitation requiring hospital admission x 4 days  . Compression fracture of fourth lumbar vertebra ([place]) 04/2019   - s/p Kyphoplasty; T12, L3-5 [facility])  . COPD (chronic obstructive pulmonary disease) (HCC)    Centrilobular Emphysema  . Coronary artery disease involving native coronary artery without angina pectoris 1998   - s/p CABG, occluded SVG-D1; patent LIMA-LAD, SVG-OM, SVG- RCA  . Dementia ([place])    memory loss - started noting late 2019  . Falls frequently    broke foot  . Fracture 2021   left foot  . GERD (gastroesophageal reflux disease)   . Hemorrhoid 02/10/2015  . History of hiatal hernia   . History of kidney stones   . HOH (hard of hearing)   . Hypertension   . Lewy body dementia ([place]) 04/04/2020  . Macular degeneration disease   . Osteoporosis   . PAF (paroxysmal atrial fibrillation) ([place])    on Eliquis for OAC - Rate Control   . Prostate cancer ([place])   . Prostate disorder   . Sciatic pain    Chronic  . Sleep apnea    cpap  . Temporary cerebral vascular dysfunction 02/10/2015  Had negative work-up.  July, 2012.  No medication changes, had forgot them that day, got dehydrated.   . Urinary incontinence     Past Surgical History:  Procedure Laterality Date  . ABDOMINAL AORTIC ENDOVASCULAR STENT GRAFT Bilateral 05/18/2019   Procedure: ABDOMINAL AORTIC ENDOVASCULAR STENT GRAFT;  Surgeon: Algernon Huxley, MD;  Location: [facility] ORS;  Service: Vascular;  Laterality: Bilateral;  . ANKLE SURGERY Left    ORIF   . Arch aortogram and carotid aortogram  06/02/2006   Dr Oneida Alar did surgery  . BASAL CELL CARCINOMA EXCISION  04/2016   Dermatology  . CARDIAC CATHETERIZATION  01/30/1998    (Dr. Linard Millers): Native LAD & mRCA CTO. 90% D1. Mod Cx. SVG-D1 CTO. SVG-RCA & SVG-OM along with LIMA-LAD patent;  LV NORMAL Fxn  . CAROTID  ENDARTERECTOMY Left Oct. 29, 2007   Dr. Oneida Alar:  . CATARACT EXTRACTION W/PHACO Right 03/05/2017   Procedure: CATARACT EXTRACTION PHACO AND INTRAOCULAR LENS PLACEMENT (IOC);  Surgeon: Leandrew Koyanagi, MD;  Location: [facility] ORS;  Service: Ophthalmology;  Laterality: Right;  [place] 00:58.5 AP% 10.6 CDE 6.20 Fluid Pack lot # [id number]  . CATARACT EXTRACTION W/PHACO Left 04/15/2017   Procedure: CATARACT EXTRACTION PHACO AND INTRAOCULAR LENS PLACEMENT ([place])  Left;  Surgeon: Leandrew Koyanagi, MD;  Location: [place];  Service: Ophthalmology;  Laterality: Left;  . COLONOSCOPY WITH PROPOFOL N/A 09/15/2017   Procedure: COLONOSCOPY WITH PROPOFOL;  Surgeon: Lucilla Lame, MD;  Location: [facility] ENDOSCOPY;  Service: Endoscopy;  Laterality: N/A;  . COLONOSCOPY WITH PROPOFOL N/A 10/26/2018   Procedure: COLONOSCOPY WITH PROPOFOL;  Surgeon: Lucilla Lame, MD;  Location: [facility] ENDOSCOPY;  Service: Endoscopy;  Laterality: N/A;  . CORONARY ARTERY BYPASS GRAFT  1998   LIMA-LAD, SVG-OM, SVG-RPDA, SVG-DIAG  . CPET / MET  12/2012   Mild chronotropic incompetence - read 82% of predicted; also reduced effort; peak VO2 15.7 / 75% (did not reach Max effort) -- suggested ischemic response in last 1.5 minutes of exercise. Normal pulmonary function on PFTs but poor response to  . CYSTOSCOPY W/ RETROGRADES Left 01/13/2020   Procedure: CYSTOSCOPY WITH RETROGRADE PYELOGRAM;  Surgeon: Billey Co, MD;  Location: [facility] ORS;  Service: Urology;  Laterality: Left;  . CYSTOSCOPY WITH STENT PLACEMENT Left 12/26/2019   Procedure: CYSTOSCOPY WITH STENT PLACEMENT;  Surgeon: Billey Co, MD;  Location: [facility] ORS;  Service: Urology;  Laterality: Left;  . CYSTOSCOPY/URETEROSCOPY/HOLMIUM LASER/STENT PLACEMENT Left 01/13/2020   Procedure: CYSTOSCOPY/URETEROSCOPY/HOLMIUM LASER/STENT EXCHANGE;  Surgeon: Billey Co, MD;  Location: [facility] ORS;  Service: Urology;  Laterality: Left;  . ENDOVASCULAR REPAIR/STENT GRAFT N/A 05/18/2019    Procedure: ENDOVASCULAR REPAIR/STENT GRAFT;  Surgeon: Algernon Huxley, MD;  Location: [facility] INVASIVE CV LAB;; EVAR - 23 mm prox, 12 cm distal & x 18 cm length Gore Excluder Endoprosthesis Main body (via RFA distal to lowest Renal A); 79m x 14 cm L Contralateral Limb for L Iliac - extended with 8 mm x 6cm LifeStar stent; no additional R Iliac PTA needed.   . ESOPHAGOGASTRODUODENOSCOPY (EGD) WITH PROPOFOL N/A 09/15/2017   Procedure: ESOPHAGOGASTRODUODENOSCOPY (EGD) WITH PROPOFOL;  Surgeon: WLucilla Lame MD;  [facility];  Service: Endoscopy;  Laterality: N/A;  . FRACTURE SURGERY Right 03/19/2015   wrist  . FRACTURE SURGERY Left   . HIATAL HERNIA REPAIR    . ILIAC ARTERY STENT  12/15/2005   PTA and direct stenting rgt and lft common iliac arteries  . KYPHOPLASTY N/A 04/05/2019   Procedure: KYPHOPLASTY T12, L3, L4 L5;  Surgeon: Kennedy Bucker, MD;  Location: [facility] ORS;  Service: Orthopedics;  Laterality: N/A;  . NM GATED MYOVIEW ([facility] HX)  05/'19; 8/'20   a) [facility]: Low Risk - no ischemia or infarct.  EF > 65%. no RWMA;; b) [facility]): Normal wall motion.  No ischemia or infarction.  . OPEN REDUCTION INTERNAL FIXATION (ORIF) DISTAL RADIAL FRACTURE Left 01/13/2019   Procedure: OPEN REDUCTION INTERNAL FIXATION (ORIF) DISTAL  RADIAL FRACTURE - LEFT - SLEEP APNEA;  Surgeon: Kennedy Bucker, MD;  Location: [facility] ORS;  Service: Orthopedics;  Laterality: Left;  . ORIF ELBOW FRACTURE Right 01/01/2017   Procedure: OPEN REDUCTION INTERNAL FIXATION (ORIF) ELBOW/OLECRANON FRACTURE;  Surgeon: Kennedy Bucker, MD;  Location: [facility] ORS;  Service: Orthopedics;  Laterality: Right;  . ORIF WRIST FRACTURE Right 03/19/2015   Procedure: OPEN REDUCTION INTERNAL FIXATION (ORIF) WRIST FRACTURE;  Surgeon: Kennedy Bucker, MD;  Location: [facility] ORS;  Service: Orthopedics;  Laterality: Right;  . SHOULDER ARTHROSCOPY Right   . THORACIC AORTA - CAROTID ANGIOGRAM  October 2007   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 TRANSTHORACIC ECHOCARDIOGRAM  February 2016   [facility]: Normal LV function. Dilated left atrium.  Krystal Clark ECHOCARDIOGRAM  04/2019   [facility] April 20, 2019): EF 55-60 %.  Mild LVH.  Relatively normal valves.    There were no vitals filed for this visit.   Subjective Assessment - 08/23/20 0902    Subjective The patient reports doing ok today, presents with his aide.    Pertinent History Patient is returning to PT for evaluation of balance. Patient has been seen by this therapist in the past. PMH AAA, arthritis, basal cell carcinoma of eyelid, benign neoplasm of ascending/descending colon, bilateral iliac artery stenosis, colon polyps, compression fx of 4th lumbar vertebra (s/p kyphoplasty T12, L3-5), COPD, CAD, dementia, falls,  GERD, hemorroids, HOH, HTN, Lewy Body Dementia, macular degeneration disease, osteoporosis, PAF, prostate cancer, prostate disorder, sciatic pain, sleep apnea, temporary cerebral vascular dysfunction. Comes with aide, having multiple near falls per day per aide, mainly to the sides.    Limitations Walking;Standing;House hold activities;Other (comment);Lifting;Sitting    How long can you sit comfortably? n/a back pain no longer limiting    How long can you stand comfortably? 10-12 with walker    How long can you walk comfortably? walks 15 minutes with a walker    Diagnostic tests MRI from [facility] dated 03/28/2019 report was reviewed today. Acute compression fractures are noted at T12, L3, and L4. He has facet arthropathy at the lower lumbar levels. At L3-4 he has moderate central stenosis.    Patient Stated Goals walk without stumbling, be able to be steady enough to not have to have someone holding his gait belt.    Currently in Pain? No/denies    Pain Score 0-No pain            Treatment:  airex pad: modified tandem stance 2x 30 seconds each  airex pad  throw balls at target. X 2, patient to hold bag, reach into bag and then throw target.  airex beam side stepping 3 laps single UE support airex bean semi tandem walking 3 laps 4lb ankle weights : -walking marches with cues for increased hip flexion; BUE support 4x length of // bars -hip abduction 12x each LE, cues for upright posture -hip extension 10x each LE, cues for larger step length    Seated: 4lb  ankle weight:  -marches with arms crossed 10x -LAQ with arms crossed 10x each LE                            PT Short Term Goals - 08/21/20 0959      PT SHORT TERM GOAL #1   Title Patient will be independent in home exercise program to improve strength/mobility for better functional independence with ADLs.    Baseline 11/11: HEP given 12/21: HEP compliant    Time 4    Period Weeks    Status Achieved    Target Date 08/09/20             PT Long Term Goals - 08/21/20 0958      PT LONG TERM GOAL #1   Title Patient will increase FOTO score to equal to or greater than 51% to demonstrate statistically significant improvement in mobility and quality of life.    Baseline 11/11: 42% 12/21: 46.8%    Time 12    Period Weeks    Status Partially Met    Target Date 10/04/20      PT LONG TERM GOAL #2   Title Patient will demonstrate an improved Berg Balance Score of > 40/56 as to demonstrate improved balance with ADLs such as sitting/standing and transfer balance and reduced fall risk.    Baseline 11/11: 34/56 12/21: 37/56    Time 12    Period Weeks    Status Partially Met    Target Date 10/04/20      PT LONG TERM GOAL #3   Title Patient (> 28 years old) will complete five times sit to stand test in < 15 seconds without UE support indicating an increased LE strength and improved balance    Baseline 11/11; 23.35 seconds with UE support 12/21: 27.85 with one near LOB    Time 12    Period Weeks    Status On-going    Target Date 10/04/20      PT LONG TERM GOAL  #4   Title Patient will increase 10 meter walk test to >1.78m/s with LRAD  as to improve gait speed for better community ambulation and to reduce fall risk.    Baseline 11/11: 0.74 m/s without AD with one near LOB 12/21: 1.08 m/s    Time 12    Period Weeks    Status Achieved      PT LONG TERM GOAL #5   Title Patient will increase BLE gross strength to 4+/5 as to improve functional strength for independent gait, increased standing tolerance and increased ADL ability.    Baseline 11/11: see note    Time 12    Period Weeks    Status Partially Met    Target Date 10/04/20                 Plan - 08/23/20 1032    Clinical Impression Statement The patient improving well requiring min CGA with balance activities.  The patient continues with LE weakness and requires verbal and tactile cueing for correct exercise technique.  Patient appeared slightly confused at time requiring verbal reminders of task being performed.  The patient continues to benefit from skilled PT services to further improve stability and strength for further progression towards rehab goals and improve quality of life.    Personal Factors and Comorbidities Age;Comorbidity 3+;Fitness;Past/Current Experience;Sex;Social Background;Time since onset of injury/illness/exacerbation;Transportation    Comorbidities AAA, arthritis, atherosclerotic heart disease, (s/p CABG),  benign neoplasm of ascending/descending colon, COPD, CAD, dementia, dysrhythmia, frequent falls, GERD, hemorrhoid, HOH, HTN, PAF, prostate cancer.    Examination-Activity Limitations Bathing;Bend;Carry;Dressing;Continence;Stairs;Squat;Reach Overhead;Locomotion Level;Lift;Stand;Transfers;Toileting;Bed Mobility    Examination-Participation Restrictions Church;Cleaning;Community Activity;Driving;Interpersonal Relationship;Laundry;Volunteer;Shop;Personal Finances;Meal Prep;Yard Work    Product manager    Rehab Potential Fair     Clinical Impairments Affecting Rehab Potential (+) good support system, high prior level of function (-) currently undergoing radiation therapy for prostate cancer, memory deficits     PT Frequency 2x / week    PT Duration 12 weeks    PT Treatment/Interventions ADLs/Self Care Home Management;Aquatic Therapy;Cryotherapy;Moist Heat;Traction;Ultrasound;Therapeutic activities;Functional mobility training;Stair training;Gait training;DME Instruction;Therapeutic exercise;Balance training;Neuromuscular re-education;Patient/family education;Manual techniques;Passive range of motion;Energy conservation;Vestibular;Taping;Biofeedback;Electrical Stimulation;Iontophoresis 4mg /ml Dexamethasone;Dry needling;Visual/perceptual remediation/compensation;Cognitive remediation;Canalith Repostioning    PT Next Visit Plan balance, strength    PT Home Exercise Plan see above    Consulted and Agree with Plan of Care Patient;Family member/caregiver    Family Member Consulted aide           Patient will benefit from skilled therapeutic intervention in order to improve the following deficits and impairments:  Abnormal gait,Cardiopulmonary status limiting activity,Decreased activity tolerance,Decreased balance,Decreased knowledge of precautions,Decreased endurance,Decreased coordination,Decreased cognition,Decreased knowledge of use of DME,Decreased mobility,Difficulty walking,Decreased safety awareness,Decreased strength,Impaired flexibility,Impaired perceived functional ability,Impaired sensation,Postural dysfunction,Improper body mechanics,Pain,Impaired vision/preception,Decreased range of motion  Visit Diagnosis: Unsteadiness on feet  Muscle weakness (generalized)  Other abnormalities of gait and mobility  Chronic low back pain, unspecified back pain laterality, unspecified whether sciatica present  Difficulty in walking, not elsewhere classified     Problem List Patient Active Problem List   Diagnosis  Date Noted  . AAA (abdominal aortic aneurysm) without rupture ([place]) 06/29/2020  . LBD (Lewy body dementia) ([place]) 05/14/2020  . Numbness and tingling in left hand 03/02/2020  . Hematuria   . Acute kidney injury superimposed on CKD ([place])   . Sepsis due to gram-negative UTI ([place]) 12/26/2019  . Left nephrolithiasis 12/26/2019  . Hydronephrosis, left 12/26/2019  . Status post abdominal aortic aneurysm (AAA) repair 04/15/2019  . Do not intubate, cardiopulmonary resuscitation (CPR)-only code status 10/08/2018  . Prostate cancer ([place]) 02/25/2018  . Prostatic cyst 01/15/2018  . Blood in stool   . Abnormal feces   . Stricture and stenosis of esophagus   . Mild cognitive impairment 08/18/2017  . Senile purpura ([place]) 04/29/2017  . Anemia 04/29/2017  . Frequent falls 04/29/2017  . Simple chronic bronchitis ([place]) 12/17/2016  . Benign localized hyperplasia of prostate with urinary obstruction 07/15/2016  . Elevated PSA 07/15/2016  . Incomplete emptying of bladder 07/15/2016  . Urge incontinence 07/15/2016  . Hypothyroidism 04/25/2016  . Macular degeneration 04/25/2016  . Erectile dysfunction due to arterial insufficiency   . Ischemic heart disease with chronotropic incompetence   . CN (constipation) 02/10/2015  . DD (diverticular disease) 02/10/2015  . Weakness 02/10/2015  . Lichen planus 94/17/4081  . Restless leg 02/10/2015  . 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 B12 deficiency 02/10/2015  . COPD (chronic obstructive pulmonary disease) ([place]) 11/14/2014  . Post Inflammatory Lung Changes 11/14/2014  . PAF (paroxysmal atrial fibrillation) (HCC) CHA2DS2-VASc = 4. AC = Eliquis   . Insomnia 12/09/2013  . OSA on CPAP   . Dyspnea on exertion - -essentially resolved with BB dose reduction & wgt loss 03/29/2013  . Overweight (BMI 25.0-29.9) -- Wgt back up 01/17/2013  . Hx of CABG   . Essential hypertension   . Hyperlipidemia with target LDL less than 70   .  Aorto-iliac disease ([place])   .  BCC (basal cell carcinoma), eyelid 01/03/2013  . Allergic rhinitis 08/17/2009  . Adaptation reaction 05/28/2009  . Acid reflux 05/28/2009  . Arthritis, degenerative 05/28/2009  . 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 Carotid artery occlusion without infarction 06/01/2006  . PAD (peripheral artery disease) - bilateral common iliac stents 12/15/2005  . Atherosclerotic heart disease of artery bypass graft 09/01/1997    Hal Morales PT, DPT 08/23/2020, 10:39 AM  [place] MAIN [facility] SERVICES [address] [place], [place], [address] Phone: [phone]   Fax:  [phone]  Name: [name] MRN: [id number] Date of Birth: 14-Jun-1934

## 2020-08-25 ENCOUNTER — Emergency Department
Admission: EM | Admit: 2020-08-25 | Discharge: 2020-08-25 | Disposition: A | Discharge status: Home / Self Care | Payer: Medicare Other | Attending: Student | Admitting: Student

## 2020-08-25 ENCOUNTER — Other Ambulatory Visit: Payer: Self-pay

## 2020-08-25 DIAGNOSIS — I10 Essential (primary) hypertension: Secondary | ICD-10-CM | POA: Insufficient documentation

## 2020-08-25 DIAGNOSIS — I2510 Atherosclerotic heart disease of native coronary artery without angina pectoris: Secondary | ICD-10-CM | POA: Diagnosis not present

## 2020-08-25 DIAGNOSIS — S6991XA Unspecified injury of right wrist, hand and finger(s), initial encounter: Secondary | ICD-10-CM | POA: Diagnosis present

## 2020-08-25 DIAGNOSIS — Z7901 Long term (current) use of anticoagulants: Secondary | ICD-10-CM | POA: Diagnosis not present

## 2020-08-25 DIAGNOSIS — S61411A Laceration without foreign body of right hand, initial encounter: Secondary | ICD-10-CM | POA: Diagnosis not present

## 2020-08-25 DIAGNOSIS — E039 Hypothyroidism, unspecified: Secondary | ICD-10-CM | POA: Diagnosis not present

## 2020-08-25 DIAGNOSIS — Z85828 Personal history of other malignant neoplasm of skin: Secondary | ICD-10-CM | POA: Diagnosis not present

## 2020-08-25 DIAGNOSIS — Z8546 Personal history of malignant neoplasm of prostate: Secondary | ICD-10-CM | POA: Diagnosis not present

## 2020-08-25 DIAGNOSIS — Z951 Presence of aortocoronary bypass graft: Secondary | ICD-10-CM | POA: Diagnosis not present

## 2020-08-25 DIAGNOSIS — F0390 Unspecified dementia without behavioral disturbance: Secondary | ICD-10-CM | POA: Diagnosis not present

## 2020-08-25 DIAGNOSIS — Z79899 Other long term (current) drug therapy: Secondary | ICD-10-CM | POA: Diagnosis not present

## 2020-08-25 DIAGNOSIS — J449 Chronic obstructive pulmonary disease, unspecified: Secondary | ICD-10-CM | POA: Insufficient documentation

## 2020-08-25 DIAGNOSIS — Z87891 Personal history of nicotine dependence: Secondary | ICD-10-CM | POA: Insufficient documentation

## 2020-08-25 DIAGNOSIS — Z85038 Personal history of other malignant neoplasm of large intestine: Secondary | ICD-10-CM | POA: Diagnosis not present

## 2020-08-25 DIAGNOSIS — X58XXXA Exposure to other specified factors, initial encounter: Secondary | ICD-10-CM | POA: Insufficient documentation

## 2020-08-25 DIAGNOSIS — S61419A Laceration without foreign body of unspecified hand, initial encounter: Secondary | ICD-10-CM

## 2020-08-25 MED ORDER — LIDOCAINE HCL (PF) 1 % IJ SOLN: 10.0000 mL | Freq: Once | INTRAMUSCULAR | Status: DC

## 2020-08-25 MED ORDER — BACITRACIN ZINC 500 UNIT/GM EX OINT
TOPICAL_OINTMENT | Freq: Once | CUTANEOUS | Status: AC
  Administered 2020-08-25: 2 via TOPICAL
  Filled 2020-08-25: qty 1.8

## 2020-08-25 MED ORDER — CEPHALEXIN 500 MG PO CAPS: 500.0000 mg | ORAL_CAPSULE | Freq: Three times a day (TID) | ORAL | 0 refills | Status: AC

## 2020-08-25 MED ORDER — CEPHALEXIN 500 MG PO CAPS
500.0000 mg | ORAL_CAPSULE | Freq: Once | ORAL | Status: AC
  Administered 2020-08-25: 500 mg via ORAL
  Filled 2020-08-25: qty 1

## 2020-08-25 NOTE — ED Triage Notes (Signed)
Pt presents via POV with laceration to the right hand. Bleeding controlled at this time. Reports taker blood thinners. PMH Dementia.

## 2020-08-25 NOTE — ED Notes (Signed)
Son declined discharge vital signs. He was informed of hypertension, states father was agitated and that he will check it at home.

## 2020-08-25 NOTE — ED Notes (Signed)
Patient is wearing a gait belt and has an unsteady gait. Patient was assist to stretcher with one person assist. Patient's son states patient became agitated when they came back from his girlfriend house because he thought someone had moved his car. Son states patient grabbed the keys which gouged his hand and son states he believes his fingernail caused the skin tear on the dorsum of right hand. Patient also c/o right wrist pain.
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
## 2020-08-27 ENCOUNTER — Ambulatory Visit: Payer: Medicare Other

## 2020-08-27 ENCOUNTER — Other Ambulatory Visit: Payer: Self-pay

## 2020-08-27 DIAGNOSIS — R2681 Unsteadiness on feet: Secondary | ICD-10-CM

## 2020-08-27 DIAGNOSIS — R2689 Other abnormalities of gait and mobility: Secondary | ICD-10-CM

## 2020-08-27 DIAGNOSIS — M6281 Muscle weakness (generalized): Secondary | ICD-10-CM

## 2020-08-27 NOTE — Progress Notes (Signed)
Subjective:   [name] is a 86 y.o. male who presents for Medicare Annual/Subsequent preventive examination.  I connected with [name] today by telephone and verified that I am speaking with the correct person using two identifiers. Location patient: home Location provider: work Persons participating in the virtual visit: patient, provider.   I discussed the limitations, risks, security and privacy concerns of performing an evaluation and management service by telephone and the availability of in person appointments. I also discussed with the patient that there may be a patient responsible charge related to this service. The patient expressed understanding and verbally consented to this telephonic visit.    Interactive audio and video telecommunications were attempted between this provider and patient, however failed, due to patient having technical difficulties OR patient did not have access to video capability.  We continued and completed visit with audio only.   Review of Systems    N/A  Cardiac Risk Factors include: advanced age (>32mn, >>23women);male gender;hypertension;dyslipidemia     Objective:    There were no vitals filed for this visit. There is no height or weight on file to calculate BMI.  Advanced Directives 08/28/2020 08/25/2020 07/12/2020 06/04/2020 04/04/2020 02/28/2020 02/13/2020  Does Patient Have a Medical Advance Directive? Yes Yes No No Yes No No  Type of AParamedicof ADresserLiving will Living will;Out of facility DNR (pink MOST or yellow form) - - HPress photographerLiving will - -  Does patient want to make changes to medical advance directive? - - - - - - -  Copy of HMandanin Chart? Yes - validated most recent copy scanned in chart (See row information) - - - No - copy requested - -  Would patient like information on creating a medical advance directive? - - - - No - Patient declined - -     Current Medications (verified) Outpatient Encounter Medications as of 08/28/2020  Medication Sig  . acetaminophen (TYLENOL) 500 MG tablet Take 500-1,000 mg by mouth every 6 (six) hours as needed for mild pain or moderate pain.   . Ascorbic Acid (VITAMIN C) 1000 MG tablet Take 1,000 mg by mouth daily.  . cephALEXin (KEFLEX) 500 MG capsule Take 1 capsule (500 mg total) by mouth 3 (three) times daily for 7 days.  . Cyanocobalamin (VITAMIN B-12) 500 MCG SUBL Place 500 mcg under the tongue daily.   .Marland KitchenELIQUIS 5 MG TABS tablet TAKE 1 TABLET BY MOUTH TWICE DAILY  . ezetimibe (ZETIA) 10 MG tablet TAKE 1 TABLET BY MOUTH DAILY  . finasteride (PROSCAR) 5 MG tablet TAKE ONE TABLET BY MOUTH EVERY DAY  . fluticasone (FLONASE) 50 MCG/ACT nasal spray PLACE TWO SPRAYS IN BOTH NOSRTILS EVERY DAY AS NEEDED FOR ALLERGIES  . Magnesium Oxide 500 MG TABS Take 500 mg by mouth every evening.  . melatonin 3 MG TABS tablet Take 0.5 tablets (1.5 mg total) by mouth at bedtime.  . metoprolol succinate (TOPROL-XL) 50 MG 24 hr tablet TAKE ONE TABLET BY MOUTH EVERY DAY TAKE WITH OR IMMEDIATELY FOLLOWING A MEAL  . Multiple Vitamins-Minerals (PRESERVISION AREDS 2 PO) Take 1 tablet by mouth 2 (two) times daily.   .Marland KitchenMYRBETRIQ 50 MG TB24 tablet TAKE ONE TABLET BY MOUTH EVERY DAY  . nitroGLYCERIN (NITROSTAT) 0.4 MG SL tablet Place 1 tablet (0.4 mg total) under the tongue every 5 (five) minutes as needed for chest pain.  . pantoprazole (PROTONIX) 40 MG tablet TAKE ONE TABLET  BY MOUTH EVERY DAY  . QUEtiapine (SEROQUEL) 25 MG tablet Take 12.5 mg by mouth.   . rosuvastatin (CRESTOR) 20 MG tablet TAKE ONE TABLET BY MOUTH EVERY DAY  . umeclidinium-vilanterol (ANORO ELLIPTA) 62.5-25 MCG/INH AEPB Inhale 1 puff into the lungs daily.   [name] lisinopril (ZESTRIL) 2.5 MG tablet Take 1 tablet (2.5 mg total) by mouth daily.   No facility-administered encounter medications on file as of 08/28/2020.    Allergies (verified) Clonazepam,  Codeine, Ropinirole, Hydrocodone, Niacin and related, Oxycodone, and Tramadol   History: Past Medical History:  Diagnosis Date  . AAA (abdominal aortic aneurysm) ([place]) 05/2019   Relativley Rapid progression from June-Aug 2020 (4.6 - 5.4 cm): s/p EVAR (Dr. Lucky Cowboy [facility]): EVAR - 23 mm prox, 12 cm distal & x 18 cm length Gore Excluder Endoprosthesis Main body (via RFA distal to lowest Renal A); 9m x 14 cm L Contralateral Limb for L Iliac - extended with 8 mm x 6cm LifeStar stent (post-dilated with 7 mm DEB).  Additional R Iliac PTA not required.   . 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 ([place] 09/15/2017   09-15-2017 DIAGNOSIS:  A. COLON POLYP X 2, ASCENDING; COLD SNARE:  - TUBULAR ADENOMA.  - NEGATIVE FOR HIGH-GRADE DYSPLASIA AND MALIGNANCY.  - NO ADDITIONAL TISSUE IDENTIFIED; FECAL MATERIAL PRESENT.   B. COLON POLYP X 2, DESCENDING; COLD SNARE:  - ADENOCARCINOMA ARISING IN A DYSPLASTIC SESSILE SERRATED ADENOMA, WITH  LYMPHOVASCULAR INVASION, SEE COMMENT.  - TUBULAR ADENOMA, 2 FRAGMENTS, NEGATIVE FO  . Arthritis   . Basal cell carcinoma of eyelid 01/03/2013   2019 - R side of Nose  . Benign neoplasm of ascending colon   . Benign neoplasm of descending colon   . Bilateral iliac artery stenosis ([place] 2007   Bilateral Iliac A stenting; extension of EVAR limbs into both Iliacs -- additional stent placed in L Iliac.  . Cancer ([place]    Skin cancer  . Colon polyps   . Complication of anesthesia    severe confusion agitation requiring hospital admission x 4 days  . Compression fracture of fourth lumbar vertebra ([place] 04/2019   - s/p Kyphoplasty; T12, L3-5 ([facility]  . COPD (chronic obstructive pulmonary disease) (HCC)    Centrilobular Emphysema  . Coronary artery disease involving native coronary artery without angina pectoris 1998   - s/p CABG, occluded SVG-D1; patent LIMA-LAD, SVG-OM, SVG- RCA  . Dementia ([place]    memory loss - started noting late 2019  . Falls frequently    broke foot  . Fracture 2021    left foot  . GERD (gastroesophageal reflux disease)   . Hemorrhoid 02/10/2015  . History of hiatal hernia   . History of kidney stones   . HOH (hard of hearing)   . Hypertension   . [place]y body dementia ([place] 04/04/2020  . Macular degeneration disease   . Osteoporosis   . PAF (paroxysmal atrial fibrillation) ([place]    on Eliquis for OAC - Rate Control   . Prostate cancer ([place]   . Prostate disorder   . Sciatic pain    Chronic  . Sleep apnea    cpap  . Temporary cerebral vascular dysfunction 02/10/2015   Had negative work-up.  July, 2012.  No medication changes, had forgot them that day, got dehydrated.   . Urinary incontinence    Past Surgical History:  Procedure Laterality Date  . ABDOMINAL AORTIC ENDOVASCULAR STENT GRAFT Bilateral 05/18/2019   Procedure: ABDOMINAL AORTIC ENDOVASCULAR STENT GRAFT;  Surgeon: DLucky Cowboy  Erskine Squibb, MD;  Location: [facility] ORS;  Service: Vascular;  Laterality: Bilateral;  . ANKLE SURGERY Left    ORIF   . Arch aortogram and carotid aortogram  06/02/2006   Dr Oneida Alar did surgery  . BASAL CELL CARCINOMA EXCISION  04/2016   Dermatology  . CARDIAC CATHETERIZATION  01/30/1998    (Dr. Linard Millers): Native LAD & mRCA CTO. 90% D1. Mod Cx. SVG-D1 CTO. SVG-RCA & SVG-OM along with LIMA-LAD patent;  LV NORMAL Fxn  . CAROTID ENDARTERECTOMY Left Oct. 29, 2007   Dr. Oneida Alar:  . CATARACT EXTRACTION W/PHACO Right 03/05/2017   Procedure: CATARACT EXTRACTION PHACO AND INTRAOCULAR LENS PLACEMENT (IOC);  Surgeon: Leandrew Koyanagi, MD;  Location: [facility] ORS;  Service: Ophthalmology;  Laterality: Right;  [place] 00:58.5 AP% 10.6 CDE 6.20 Fluid Pack lot # [id number]  . CATARACT EXTRACTION W/PHACO Left 04/15/2017   Procedure: CATARACT EXTRACTION PHACO AND INTRAOCULAR LENS PLACEMENT ([place])  Left;  Surgeon: Leandrew Koyanagi, MD;  Location: [facility];  Service: Ophthalmology;  Laterality: Left;  . COLONOSCOPY WITH PROPOFOL N/A 09/15/2017   Procedure: COLONOSCOPY WITH PROPOFOL;  Surgeon:  Lucilla Lame, MD;  Location: [facility] ENDOSCOPY;  Service: Endoscopy;  Laterality: N/A;  . COLONOSCOPY WITH PROPOFOL N/A 10/26/2018   Procedure: COLONOSCOPY WITH PROPOFOL;  Surgeon: Lucilla Lame, MD;  Location: [facility] ENDOSCOPY;  Service: Endoscopy;  Laterality: N/A;  . CORONARY ARTERY BYPASS GRAFT  1998   LIMA-LAD, SVG-OM, SVG-RPDA, SVG-DIAG  . CPET / MET  12/2012   Mild chronotropic incompetence - read 82% of predicted; also reduced effort; peak VO2 15.7 / 75% (did not reach Max effort) -- suggested ischemic response in last 1.5 minutes of exercise. Normal pulmonary function on PFTs but poor response to  . CYSTOSCOPY W/ RETROGRADES Left 01/13/2020   Procedure: CYSTOSCOPY WITH RETROGRADE PYELOGRAM;  Surgeon: Billey Co, MD;  Location: [facility] ORS;  Service: Urology;  Laterality: Left;  . CYSTOSCOPY WITH STENT PLACEMENT Left 12/26/2019   Procedure: CYSTOSCOPY WITH STENT PLACEMENT;  Surgeon: Billey Co, MD;  Location: [facility] ORS;  Service: Urology;  Laterality: Left;  . CYSTOSCOPY/URETEROSCOPY/HOLMIUM LASER/STENT PLACEMENT Left 01/13/2020   Procedure: CYSTOSCOPY/URETEROSCOPY/HOLMIUM LASER/STENT EXCHANGE;  Surgeon: Billey Co, MD;  Location: [facility] ORS;  Service: Urology;  Laterality: Left;  . ENDOVASCULAR REPAIR/STENT GRAFT N/A 05/18/2019   Procedure: ENDOVASCULAR REPAIR/STENT GRAFT;  Surgeon: Algernon Huxley, MD;  Location: [facility] INVASIVE CV LAB;; EVAR - 23 mm prox, 12 cm distal & x 18 cm length Gore Excluder Endoprosthesis Main body (via RFA distal to lowest Renal A); 18m x 14 cm L Contralateral Limb for L Iliac - extended with 8 mm x 6cm LifeStar stent; no additional R Iliac PTA needed.   . ESOPHAGOGASTRODUODENOSCOPY (EGD) WITH PROPOFOL N/A 09/15/2017   Procedure: ESOPHAGOGASTRODUODENOSCOPY (EGD) WITH PROPOFOL;  Surgeon: WLucilla Lame MD;  [facility];  Service: Endoscopy;  Laterality: N/A;  . FRACTURE SURGERY Right 03/19/2015   wrist  . FRACTURE SURGERY Left   . HIATAL HERNIA REPAIR     . ILIAC ARTERY STENT  12/15/2005   PTA and direct stenting rgt and lft common iliac arteries  . KYPHOPLASTY N/A 04/05/2019   Procedure: KYPHOPLASTY T12, L3, L4 L5;  Surgeon: MHessie Knows MD;  Location: [facility] ORS;  Service: Orthopedics;  Laterality: N/A;  . NM GATED MYOVIEW ([place])  05/'19; 8/'20   a) [facility]: Low Risk - no ischemia or infarct.  EF > 65%. no RWMA;; b) ([facility]: Normal wall motion.  No ischemia or infarction.  .[name]  OPEN REDUCTION INTERNAL FIXATION (ORIF) DISTAL RADIAL FRACTURE Left 01/13/2019   Procedure: OPEN REDUCTION INTERNAL FIXATION (ORIF) DISTAL  RADIAL FRACTURE - LEFT - SLEEP APNEA;  Surgeon: Hessie Knows, MD;  Location: [facility] ORS;  Service: Orthopedics;  Laterality: Left;  . ORIF ELBOW FRACTURE Right 01/01/2017   Procedure: OPEN REDUCTION INTERNAL FIXATION (ORIF) ELBOW/OLECRANON FRACTURE;  Surgeon: Hessie Knows, MD;  Location: [facility] ORS;  Service: Orthopedics;  Laterality: Right;  . ORIF WRIST FRACTURE Right 03/19/2015   Procedure: OPEN REDUCTION INTERNAL FIXATION (ORIF) WRIST FRACTURE;  Surgeon: Hessie Knows, MD;  Location: [facility] ORS;  Service: Orthopedics;  Laterality: Right;  . SHOULDER ARTHROSCOPY Right   . THORACIC AORTA - CAROTID ANGIOGRAM  October 2007   Dr. 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 TRANSTHORACIC ECHOCARDIOGRAM  February 2016   [facility]: Normal LV function. Dilated left atrium.  Domingo Dimes ECHOCARDIOGRAM  04/2019   [facility] April 20, 2019): EF 55-60 %.  Mild LVH.  Relatively normal valves.   Family History  Problem Relation Age of Onset  . Ulcers Mother        Peptic  . Dementia Mother   . Alcohol abuse Father    Social History   Socioeconomic History  . Marital status: Widowed    Spouse name: Not on file  . Number of children: 1  . Years of education: Not on file  . Highest education level: Some college, no degree  Occupational History   . Occupation: retired  Tobacco Use  . Smoking status: Former Smoker    Types: Cigarettes    Quit date: 08/04/1997    Years since quitting: 23.0  . Smokeless tobacco: Never Used  Vaping Use  . Vaping Use: Never used  Substance and Sexual Activity  . Alcohol use: No  . Drug use: No  . Sexual activity: Not Currently    Birth control/protection: None  Other Topics Concern  . Not on file  Social History Narrative   Resides at [facility] since 06/2018   He is a widowed father of one, grandfather of 38 with 3 stepchildren.    Now accompanied by his long-term significant other who lives just a few minutes away. .    She accompanies him with just not any clinic visit or hospitalization.  She also helps make sure he is taking his medication.   He has become more debilitated since his EVAR last September and has had more gait instability with balance issues.   He is not nearly as active as he used to be.  Really got deconditioned and has taken a long time to recover.   Currently undergoing physical therapy, Occupational Therapy treatments.      He has changed his CODE STATUS to DNR.   Social Determinants of Health   Financial Resource Strain: Low Risk   . Difficulty of Paying Living Expenses: Not hard at all  Food Insecurity: No Food Insecurity  . Worried About Charity fundraiser in the Last Year: Never true  . Ran Out of Food in the Last Year: Never true  Transportation Needs: No Transportation Needs  . Lack of Transportation (Medical): No  . Lack of Transportation (Non-Medical): No  Physical Activity: Inactive  . Days of Exercise per Week: 0 days  . Minutes of Exercise per Session: 0 min  Stress: No Stress Concern Present  . Feeling of Stress : Only a little  Social Connections: Socially Isolated  .  Frequency of Communication with Friends and Family: More than three times a week  . Frequency of Social Gatherings with Friends and Family: More than three times a week  . Attends  Religious Services: Never  . Active Member of Clubs or Organizations: No  . Attends Archivist Meetings: Never  . Marital Status: Widowed    Tobacco Counseling Counseling given: Not Answered   Clinical Intake:  Pre-visit preparation completed: Yes  Pain : No/denies pain     Nutritional Risks: None Diabetes: No  How often do you need to have someone help you when you read instructions, pamphlets, or other written materials from your doctor or pharmacy?: 5 - Always (Has MD and central vision loss.)  Diabetic? No  Interpreter Needed?: No      Activities of Daily Living In your present state of health, do you have any difficulty performing the following activities: 08/28/2020 07/17/2020  Hearing? Y Y  Comment Does not wear hearing aids. -  Vision? Y Y  Comment Has MD and no central vision. -  Difficulty concentrating or making decisions? Y Y  Comment Has dementia. -  Walking or climbing stairs? Y Y  Comment Due to Parkinsons. -  Dressing or bathing? Tempie Donning  Comment Is dependent with bathing. -  Doing errands, shopping? Y Y  Comment Does not drive. -  Preparing Food and eating ? Y -  Comment Does not cook- son and caregivers manages. -  Using the Toilet? N -  In the past six months, have you accidently leaked urine? Y -  Comment Occasionally, wears depends. -  Do you have problems with loss of bowel control? N -  Managing your Medications? Y -  Comment Son manages medications. -  Managing your Finances? Y -  Comment Son manages finances. -  Housekeeping or managing your Housekeeping? Y -  Comment Son and caregivers manage house work. -  Some recent data might be hidden    Patient Care Team: [name], MD as PCP - General (Family Medicine) Leonie Man, MD as PCP - Cardiology (Cardiology) Dasher, Rayvon Char, MD (Dermatology) Nickel, Sharmon Leyden, NP (Inactive) as Nurse Practitioner (Vascular Surgery) Laverle Hobby, MD as Consulting  Physician (Pulmonary Disease) Hessie Knows, MD as Consulting Physician (Orthopedic Surgery) Leandrew Koyanagi, MD as Referring Physician (Ophthalmology) Algernon Huxley, MD as Referring Physician (Vascular Surgery) Vladimir Crofts, MD as Consulting Physician (Neurology)  Indicate any recent Medical Services you may have received from other than [facility] providers in the past year (date may be approximate).     Assessment:   This is a routine wellness examination for [name].  Hearing/Vision screen No exam data present  Dietary issues and exercise activities discussed: Current Exercise Habits: The patient does not participate in regular exercise at present, Exercise limited by: neurologic condition(s);orthopedic condition(s);psychological condition(s)  Goals    . Prevent falls     Recommend to remove any items from the home that may cause slips or trips.      Depression Screen PHQ 2/9 Scores 08/28/2020 07/17/2020 05/14/2020 02/11/2018 04/29/2017 04/25/2016 04/24/2015  PHQ - 2 Score - 0 0 0 0 0 0  PHQ- 9 Score - 5 - - 0 - -  Exception Documentation Other- indicate reason in comment box - - - - - -  Not completed Completed visit with son. Per son pt does not have these feelings due to dementia. - - - - - -    Fall Risk Fall  Risk  08/28/2020 01/09/2020 06/22/2018 04/29/2017 04/25/2016  Falls in the past year? 1 1 Yes Yes Yes  Number falls in past yr: '1 1 2 ' or more 2 or more 2 or more  Comment - - - 3 falls -  Injury with Fall? 0 1 No Yes Yes  Comment - - - broke right wrist & broke right elbow -  Risk Factor Category  - - - High Fall Risk -  Risk for fall due to : Impaired balance/gait;Impaired mobility;Impaired vision;Other (Comment) History of fall(s) - - Other (Comment)  Risk for fall due to: Comment Has Parkinsons - - - -  Follow up Falls prevention discussed Falls evaluation completed - - Falls prevention discussed    FALL RISK PREVENTION PERTAINING TO THE HOME:  Any stairs in or  around the home? Yes  If so, are there any without handrails? No  Home free of loose throw rugs in walkways, pet beds, electrical cords, etc? Yes  Adequate lighting in your home to reduce risk of falls? Yes   ASSISTIVE DEVICES UTILIZED TO PREVENT FALLS:  Life alert? No  Use of a cane, walker or w/c? Yes  Grab bars in the bathroom? Yes  Shower chair or bench in shower? Yes  Elevated toilet seat or a handicapped toilet? Yes    Cognitive Function: MMSE - Mini Mental State Exam 09/14/2018  Orientation to time 5  Orientation to Place 5  Registration 3  Attention/ Calculation 4  Recall 2  Language- name 2 objects 2  Language- repeat 1  Language- follow 3 step command 3  Language- read & follow direction 1  Write a sentence 1  Copy design 1  Total score 28        Immunizations Immunization History  Administered Date(s) Administered  . Fluad Quad(high Dose 65+) 06/14/2019, 07/17/2020  . Influenza Split 05/28/2009, 10/14/2010, 10/18/2012  . Influenza, High Dose Seasonal PF 07/28/2016, 06/17/2017, 06/04/2018  . Influenza,inj,Quad PF,6+ Mos 06/21/2015  . Influenza-Unspecified 07/02/2013, 05/29/2014, 06/21/2015, 07/28/2016, 06/30/2017  . PFIZER SARS-COV-2 Vaccination 09/21/2019, 10/18/2019, 05/31/2020  . Pneumococcal Conjugate-13 11/07/2013  . Pneumococcal-Unspecified 06/15/2010  . Td 03/18/2011  . Tdap 06/25/2016    TDAP status: Up to date  Flu Vaccine status: Up to date  Pneumococcal vaccine status: Up to date  Covid-19 vaccine status: Completed vaccines  Qualifies for Shingles Vaccine? Yes   Zostavax completed No   Shingrix Completed?: No.    Education has been provided regarding the importance of this vaccine. Patient has been advised to call insurance company to determine out of pocket expense if they have not yet received this vaccine. Advised may also receive vaccine at local pharmacy or Health Dept. Verbalized acceptance and understanding.  Screening  Tests Health Maintenance  Topic Date Due  . COVID-19 Vaccine (4 - Booster for Pfizer series) 11/28/2020  . TETANUS/TDAP  06/25/2026  . INFLUENZA VACCINE  Completed  . PNA vac Low Risk Adult  Completed    Health Maintenance  There are no preventive care reminders to display for this patient.  Colorectal cancer screening: No longer required.   Lung Cancer Screening: (Low Dose CT Chest recommended if Age 67-80 years, 30 pack-year currently smoking OR have quit w/in 15years.) does not qualify.    Additional Screening:  Vision Screening: Recommended annual ophthalmology exams for early detection of glaucoma and other disorders of the eye. Is the patient up to date with their annual eye exam?  Yes  Who is the provider or what is  the name of the office in which the patient attends annual eye exams? Dr Wallace Going @ [place] If pt is not established with a provider, would they like to be referred to a provider to establish care? No .   Dental Screening: Recommended annual dental exams for proper oral hygiene  Community Resource Referral / Chronic Care Management: CRR required this visit?  No   CCM required this visit?  No      Plan:     I have personally reviewed and noted the following in the patient's chart:   . Medical and social history . Use of alcohol, tobacco or illicit drugs  . Current medications and supplements . Functional ability and status . Nutritional status . Physical activity . Advanced directives . List of other physicians . Hospitalizations, surgeries, and ER visits in previous 12 months . Vitals . Screenings to include cognitive, depression, and falls . Referrals and appointments  In addition, I have reviewed and discussed with patient certain preventive protocols, quality metrics, and best practice recommendations. A written personalized care plan for preventive services as well as general preventive health recommendations were provided to patient.      [name] [place], [place]   38/87/1957   Nurse Notes: None.

## 2020-08-27 NOTE — Therapy (Signed)
[place] MAIN [facility] SERVICES [address] [place], [place], [address] Phone: [phone]   Fax:  [phone]  Physical Therapy Treatment  Patient Details  Name: [name] MRN: [id number] Date of Birth: [birth year] Referring Provider (PT): Jennings Books K    Encounter Date: 08/27/2020   PT End of Session - 08/27/20 0836    Visit Number 12    Number of Visits 24    Date for PT Re-Evaluation 10/04/20    Authorization Type 2/10    PT Start Time 0845    PT Stop Time 0925    PT Time Calculation (min) 40 min    Equipment Utilized During Treatment Gait belt    Activity Tolerance Patient tolerated treatment well;Patient limited by fatigue    Behavior During Therapy WFL for tasks assessed/performed           Past Medical History:  Diagnosis Date  . AAA (abdominal aortic aneurysm) ([place]) 05/2019   Relativley Rapid progression from June-Aug 2020 (4.6 - 5.4 cm): s/p EVAR (Dr. Lucky Cowboy [facility]): EVAR - 23 mm prox, 12 cm distal & x 18 cm length Gore Excluder Endoprosthesis Main body (via RFA distal to lowest Renal A); 19m x 14 cm L Contralateral Limb for L Iliac - extended with 8 mm x 6cm LifeStar stent (post-dilated with 7 mm DEB).  Additional R Iliac PTA not required.   . 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 ([place] 09/15/2017   09-15-2017 DIAGNOSIS:  A. COLON POLYP X 2, ASCENDING; COLD SNARE:  - TUBULAR ADENOMA.  - NEGATIVE FOR HIGH-GRADE DYSPLASIA AND MALIGNANCY.  - NO ADDITIONAL TISSUE IDENTIFIED; FECAL MATERIAL PRESENT.   B. COLON POLYP X 2, DESCENDING; COLD SNARE:  - ADENOCARCINOMA ARISING IN A DYSPLASTIC SESSILE SERRATED ADENOMA, WITH  LYMPHOVASCULAR INVASION, SEE COMMENT.  - TUBULAR ADENOMA, 2 FRAGMENTS, NEGATIVE FO  . Arthritis   . Basal cell carcinoma of eyelid 01/03/2013   2019 - R side of Nose  . Benign neoplasm of ascending colon   . Benign neoplasm of descending colon   . Bilateral iliac artery stenosis ([place] 2007   Bilateral Iliac A stenting;  extension of EVAR limbs into both Iliacs -- additional stent placed in L Iliac.  . Cancer ([place]    Skin cancer  . Colon polyps   . Complication of anesthesia    severe confusion agitation requiring hospital admission x 4 days  . Compression fracture of fourth lumbar vertebra ([place] 04/2019   - s/p Kyphoplasty; T12, L3-5 ([facility]  . COPD (chronic obstructive pulmonary disease) (HCC)    Centrilobular Emphysema  . Coronary artery disease involving native coronary artery without angina pectoris 1998   - s/p CABG, occluded SVG-D1; patent LIMA-LAD, SVG-OM, SVG- RCA  . Dementia ([place]    memory loss - started noting late 2019  . Falls frequently    broke foot  . Fracture 2021   left foot  . GERD (gastroesophageal reflux disease)   . Hemorrhoid 02/10/2015  . History of hiatal hernia   . History of kidney stones   . HOH (hard of hearing)   . Hypertension   . Lewy body dementia ([place] 04/04/2020  . Macular degeneration disease   . Osteoporosis   . PAF (paroxysmal atrial fibrillation) ([place]    on Eliquis for OAC - Rate Control   . Prostate cancer ([place]   . Prostate disorder   . Sciatic pain    Chronic  . Sleep apnea    cpap  .  Temporary cerebral vascular dysfunction 02/10/2015   Had negative work-up.  July, 2012.  No medication changes, had forgot them that day, got dehydrated.   . Urinary incontinence     Past Surgical History:  Procedure Laterality Date  . ABDOMINAL AORTIC ENDOVASCULAR STENT GRAFT Bilateral 05/18/2019   Procedure: ABDOMINAL AORTIC ENDOVASCULAR STENT GRAFT;  Surgeon: Algernon Huxley, MD;  Location: [facility] ORS;  Service: Vascular;  Laterality: Bilateral;  . ANKLE SURGERY Left    ORIF   . Arch aortogram and carotid aortogram  06/02/2006   Dr Oneida Alar did surgery  . BASAL CELL CARCINOMA EXCISION  04/2016   Dermatology  . CARDIAC CATHETERIZATION  01/30/1998    (Dr. Linard Millers): Native LAD & mRCA CTO. 90% D1. Mod Cx. SVG-D1 CTO. SVG-RCA & SVG-OM along with LIMA-LAD patent;  LV  NORMAL Fxn  . CAROTID ENDARTERECTOMY Left Oct. 29, 2007   Dr. Oneida Alar:  . CATARACT EXTRACTION W/PHACO Right 03/05/2017   Procedure: CATARACT EXTRACTION PHACO AND INTRAOCULAR LENS PLACEMENT (IOC);  Surgeon: Leandrew Koyanagi, MD;  Location: [facility] ORS;  Service: Ophthalmology;  Laterality: Right;  [place] 00:58.5 AP% 10.6 CDE 6.20 Fluid Pack lot # [id number]  . CATARACT EXTRACTION W/PHACO Left 04/15/2017   Procedure: CATARACT EXTRACTION PHACO AND INTRAOCULAR LENS PLACEMENT ([place])  Left;  Surgeon: Leandrew Koyanagi, MD;  Location: [place];  Service: Ophthalmology;  Laterality: Left;  . COLONOSCOPY WITH PROPOFOL N/A 09/15/2017   Procedure: COLONOSCOPY WITH PROPOFOL;  Surgeon: Lucilla Lame, MD;  Location: [facility] ENDOSCOPY;  Service: Endoscopy;  Laterality: N/A;  . COLONOSCOPY WITH PROPOFOL N/A 10/26/2018   Procedure: COLONOSCOPY WITH PROPOFOL;  Surgeon: Lucilla Lame, MD;  Location: [facility] ENDOSCOPY;  Service: Endoscopy;  Laterality: N/A;  . CORONARY ARTERY BYPASS GRAFT  1998   LIMA-LAD, SVG-OM, SVG-RPDA, SVG-DIAG  . CPET / MET  12/2012   Mild chronotropic incompetence - read 82% of predicted; also reduced effort; peak VO2 15.7 / 75% (did not reach Max effort) -- suggested ischemic response in last 1.5 minutes of exercise. Normal pulmonary function on PFTs but poor response to  . CYSTOSCOPY W/ RETROGRADES Left 01/13/2020   Procedure: CYSTOSCOPY WITH RETROGRADE PYELOGRAM;  Surgeon: Billey Co, MD;  Location: [facility] ORS;  Service: Urology;  Laterality: Left;  . CYSTOSCOPY WITH STENT PLACEMENT Left 12/26/2019   Procedure: CYSTOSCOPY WITH STENT PLACEMENT;  Surgeon: Billey Co, MD;  Location: [facility] ORS;  Service: Urology;  Laterality: Left;  . CYSTOSCOPY/URETEROSCOPY/HOLMIUM LASER/STENT PLACEMENT Left 01/13/2020   Procedure: CYSTOSCOPY/URETEROSCOPY/HOLMIUM LASER/STENT EXCHANGE;  Surgeon: Billey Co, MD;  Location: [facility] ORS;  Service: Urology;  Laterality: Left;  . ENDOVASCULAR REPAIR/STENT  GRAFT N/A 05/18/2019   Procedure: ENDOVASCULAR REPAIR/STENT GRAFT;  Surgeon: Algernon Huxley, MD;  Location: [facility] INVASIVE CV LAB;; EVAR - 23 mm prox, 12 cm distal & x 18 cm length Gore Excluder Endoprosthesis Main body (via RFA distal to lowest Renal A); 58m x 14 cm L Contralateral Limb for L Iliac - extended with 8 mm x 6cm LifeStar stent; no additional R Iliac PTA needed.   . ESOPHAGOGASTRODUODENOSCOPY (EGD) WITH PROPOFOL N/A 09/15/2017   Procedure: ESOPHAGOGASTRODUODENOSCOPY (EGD) WITH PROPOFOL;  Surgeon: WLucilla Lame MD;  [facility];  Service: Endoscopy;  Laterality: N/A;  . FRACTURE SURGERY Right 03/19/2015   wrist  . FRACTURE SURGERY Left   . HIATAL HERNIA REPAIR    . ILIAC ARTERY STENT  12/15/2005   PTA and direct stenting rgt and lft common iliac arteries  . KYPHOPLASTY N/A 04/05/2019  Procedure: KYPHOPLASTY T12, L3, L4 L5;  Surgeon: Hessie Knows, MD;  Location: [facility] ORS;  Service: Orthopedics;  Laterality: N/A;  . NM GATED MYOVIEW ([place] HX)  05/'19; 8/'20   a) [facility]: Low Risk - no ischemia or infarct.  EF > 65%. no RWMA;; b) [facility]): Normal wall motion.  No ischemia or infarction.  . OPEN REDUCTION INTERNAL FIXATION (ORIF) DISTAL RADIAL FRACTURE Left 01/13/2019   Procedure: OPEN REDUCTION INTERNAL FIXATION (ORIF) DISTAL  RADIAL FRACTURE - LEFT - SLEEP APNEA;  Surgeon: Hessie Knows, MD;  Location: [facility] ORS;  Service: Orthopedics;  Laterality: Left;  . ORIF ELBOW FRACTURE Right 01/01/2017   Procedure: OPEN REDUCTION INTERNAL FIXATION (ORIF) ELBOW/OLECRANON FRACTURE;  Surgeon: Hessie Knows, MD;  Location: [facility] ORS;  Service: Orthopedics;  Laterality: Right;  . ORIF WRIST FRACTURE Right 03/19/2015   Procedure: OPEN REDUCTION INTERNAL FIXATION (ORIF) WRIST FRACTURE;  Surgeon: Hessie Knows, MD;  Location: [facility] ORS;  Service: Orthopedics;  Laterality: Right;  . SHOULDER ARTHROSCOPY Right   . THORACIC AORTA - CAROTID ANGIOGRAM  October 2007   Dr. 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 TRANSTHORACIC ECHOCARDIOGRAM  February 2016   [facility]: Normal LV function. Dilated left atrium.  Domingo Dimes ECHOCARDIOGRAM  04/2019   [facility] April 20, 2019): EF 55-60 %.  Mild LVH.  Relatively normal valves.    There were no vitals filed for this visit.   Subjective Assessment - 08/27/20 0837    Subjective The patient reports he is feeling good this morning; he has not fallen in the past 2 days.  His caregiver with him reports he has not noticed any falls since he arrived with pt this morning.  Pt has a bandage on hand, but was not able to report what happened.    Pertinent History Patient is returning to PT for evaluation of balance. Patient has been seen by this therapist in the past. PMH AAA, arthritis, basal cell carcinoma of eyelid, benign neoplasm of ascending/descending colon, bilateral iliac artery stenosis, colon polyps, compression fx of 4th lumbar vertebra (s/p kyphoplasty T12, L3-5), COPD, CAD, dementia, falls,  GERD, hemorroids, HOH, HTN, Lewy Body Dementia, macular degeneration disease, osteoporosis, PAF, prostate cancer, prostate disorder, sciatic pain, sleep apnea, temporary cerebral vascular dysfunction. Comes with aide, having multiple near falls per day per aide, mainly to the sides.    Limitations Walking;Standing;House hold activities;Other (comment);Lifting;Sitting    How long can you sit comfortably? n/a back pain no longer limiting    How long can you stand comfortably? 10-12 with walker    How long can you walk comfortably? walks 15 minutes with a walker    Diagnostic tests MRI from [place] dated 03/28/2019 report was reviewed today. Acute compression fractures are noted at T12, L3, and L4. He has facet arthropathy at the lower lumbar levels. At L3-4 he has moderate central stenosis.    Patient Stated Goals walk without stumbling, be able to  be steady enough to not have to have someone holding his gait belt.           Treatmen Today:  airex pad: modified tandem stance 2x 30 seconds each  airex pad throw balls at target. X 2, patient to hold bag, reach into bag and then throw target.  airex beam side stepping 3 laps single UE support airex bean semi tandem walking 3 laps airex pad feet together head turns R/L and  up/down 2x5 ea with single UE support on //bars  4lb ankle weights : -hip abduction 12x each LE, cues for upright posture -hip extension 10x each LE, cues for larger step length     Seated: 4lb ankle weight:  -marches with arms crossed 2 sets,12x -LAQ with arms crossed 12 sets, 10x each LE  -hamstring curls 2x12 -sit to stand 2x5  Amb with rollator walker and PT SBA, practiced navigating around obstacles and furniture, turning R and L, forwards/reverse walking with verbal cues for safety and to reduce fall risk     PT Education - 08/27/20 0931    Education provided Yes    Education Details exercise technique, goals    Person(s) Educated Patient    Methods Explanation;Demonstration;Tactile cues    Comprehension Verbalized understanding;Returned demonstration;Verbal cues required;Tactile cues required;Need further instruction            PT Short Term Goals - 08/21/20 0959      PT SHORT TERM GOAL #1   Title Patient will be independent in home exercise program to improve strength/mobility for better functional independence with ADLs.    Baseline 11/11: HEP given 12/21: HEP compliant    Time 4    Period Weeks    Status Achieved    Target Date 08/09/20             PT Long Term Goals - 08/21/20 0958      PT LONG TERM GOAL #1   Title Patient will increase FOTO score to equal to or greater than 51% to demonstrate statistically significant improvement in mobility and quality of life.    Baseline 11/11: 42% 12/21: 46.8%    Time 12    Period Weeks    Status Partially Met    Target Date 10/04/20       PT LONG TERM GOAL #2   Title Patient will demonstrate an improved Berg Balance Score of > 40/56 as to demonstrate improved balance with ADLs such as sitting/standing and transfer balance and reduced fall risk.    Baseline 11/11: 34/56 12/21: 37/56    Time 12    Period Weeks    Status Partially Met    Target Date 10/04/20      PT LONG TERM GOAL #3   Title Patient (> 61 years old) will complete five times sit to stand test in < 15 seconds without UE support indicating an increased LE strength and improved balance    Baseline 11/11; 23.35 seconds with UE support 12/21: 27.85 with one near LOB    Time 12    Period Weeks    Status On-going    Target Date 10/04/20      PT LONG TERM GOAL #4   Title Patient will increase 10 meter walk test to >1.41ms with LRAD  as to improve gait speed for better community ambulation and to reduce fall risk.    Baseline 11/11: 0.74 m/s without AD with one near LOB 12/21: 1.08 m/s    Time 12    Period Weeks    Status Achieved      PT LONG TERM GOAL #5   Title Patient will increase BLE gross strength to 4+/5 as to improve functional strength for independent gait, increased standing tolerance and increased ADL ability.    Baseline 11/11: see note    Time 12    Period Weeks    Status Partially Met    Target Date 10/04/20  Plan - 08/27/20 0933    Clinical Impression Statement Pt remained CGA for balance activities during today's session.  He required multi modal cues for correct execise technique.  Posterior LOB occured with tandem stance balance on airex and also during sit to stand exercise requiring CGA to regain balance.  Overall, he was motivated to participate in PT today and was able to perform all ther ex with only minimal rest breaks throughout session.    Personal Factors and Comorbidities Age;Comorbidity 3+;Fitness;Past/Current Experience;Sex;Social Background;Time since onset of injury/illness/exacerbation;Transportation     Comorbidities AAA, arthritis, atherosclerotic heart disease, (s/p CABG), benign neoplasm of ascending/descending colon, COPD, CAD, dementia, dysrhythmia, frequent falls, GERD, hemorrhoid, HOH, HTN, PAF, prostate cancer.    Examination-Activity Limitations Bathing;Bend;Carry;Dressing;Continence;Stairs;Squat;Reach Overhead;Locomotion Level;Lift;Stand;Transfers;Toileting;Bed Mobility    Examination-Participation Restrictions Church;Cleaning;Community Activity;Driving;Interpersonal Relationship;Laundry;Volunteer;Shop;Personal Finances;Meal Prep;Yard Work    Product manager    Rehab Potential Fair    Clinical Impairments Affecting Rehab Potential (+) good support system, high prior level of function (-) currently undergoing radiation therapy for prostate cancer, memory deficits     PT Frequency 2x / week    PT Duration 12 weeks    PT Treatment/Interventions ADLs/Self Care Home Management;Aquatic Therapy;Cryotherapy;Moist Heat;Traction;Ultrasound;Therapeutic activities;Functional mobility training;Stair training;Gait training;DME Instruction;Therapeutic exercise;Balance training;Neuromuscular re-education;Patient/family education;Manual techniques;Passive range of motion;Energy conservation;Vestibular;Taping;Biofeedback;Electrical Stimulation;Iontophoresis 67m/ml Dexamethasone;Dry needling;Visual/perceptual remediation/compensation;Cognitive remediation;Canalith Repostioning    PT Next Visit Plan balance, strength    PT Home Exercise Plan see above    Consulted and Agree with Plan of Care Patient;Family member/caregiver    Family Member Consulted aide           Patient will benefit from skilled therapeutic intervention in order to improve the following deficits and impairments:  Abnormal gait,Cardiopulmonary status limiting activity,Decreased activity tolerance,Decreased balance,Decreased knowledge of precautions,Decreased endurance,Decreased  coordination,Decreased cognition,Decreased knowledge of use of DME,Decreased mobility,Difficulty walking,Decreased safety awareness,Decreased strength,Impaired flexibility,Impaired perceived functional ability,Impaired sensation,Postural dysfunction,Improper body mechanics,Pain,Impaired vision/preception,Decreased range of motion  Visit Diagnosis: Unsteadiness on feet  Muscle weakness (generalized)  Other abnormalities of gait and mobility     Problem List Patient Active Problem List   Diagnosis Date Noted  . AAA (abdominal aortic aneurysm) without rupture ([place] 06/29/2020  . LBD (Lewy body dementia) ([facility] 05/14/2020  . Numbness and tingling in left hand 03/02/2020  . Hematuria   . Acute kidney injury superimposed on CKD ([facility]   . Sepsis due to gram-negative UTI ([place] 12/26/2019  . Left nephrolithiasis 12/26/2019  . Hydronephrosis, left 12/26/2019  . Status post abdominal aortic aneurysm (AAA) repair 04/15/2019  . Do not intubate, cardiopulmonary resuscitation (CPR)-only code status 10/08/2018  . Prostate cancer ([place] 02/25/2018  . Prostatic cyst 01/15/2018  . Blood in stool   . Abnormal feces   . Stricture and stenosis of esophagus   . Mild cognitive impairment 08/18/2017  . Senile purpura ([place] 04/29/2017  . Anemia 04/29/2017  . Frequent falls 04/29/2017  . Simple chronic bronchitis ([place] 12/17/2016  . Benign localized hyperplasia of prostate with urinary obstruction 07/15/2016  . Elevated PSA 07/15/2016  . Incomplete emptying of bladder 07/15/2016  . Urge incontinence 07/15/2016  . Hypothyroidism 04/25/2016  . Macular degeneration 04/25/2016  . Erectile dysfunction due to arterial insufficiency   . Ischemic heart disease with chronotropic incompetence   . CN (constipation) 02/10/2015  . DD (diverticular disease) 02/10/2015  . Weakness 02/10/2015  . Lichen planus 058/05/9832 . Restless leg 02/10/2015  . 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 B12 deficiency  02/10/2015  . COPD (chronic obstructive  pulmonary disease) ([place]) 11/14/2014  . Post Inflammatory Lung Changes 11/14/2014  . PAF (paroxysmal atrial fibrillation) (HCC) CHA2DS2-VASc = 4. AC = Eliquis   . Insomnia 12/09/2013  . OSA on CPAP   . Dyspnea on exertion - -essentially resolved with BB dose reduction & wgt loss 03/29/2013  . Overweight (BMI 25.0-29.9) -- Wgt back up 01/17/2013  . Hx of CABG   . Essential hypertension   . Hyperlipidemia with target LDL less than 70   . Aorto-iliac disease ([place])   . BCC (basal cell carcinoma), eyelid 01/03/2013  . Allergic rhinitis 08/17/2009  . Adaptation reaction 05/28/2009  . Acid reflux 05/28/2009  . Arthritis, degenerative 05/28/2009  . 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 Carotid artery occlusion without infarction 06/01/2006  . PAD (peripheral artery disease) - bilateral common iliac stents 12/15/2005  . Atherosclerotic heart disease of artery bypass graft 09/01/1997    [name] 08/27/2020, 9:38 AM Merdis Delay, PT, DPT Physical Therapist - [facility]  Outpatient Physical Therapy- [place] MAIN [facility] SERVICES [address] [place], [place], [address] Phone: [phone]   Fax:  [phone]  Name: [name] MRN: [id number] Date of Birth: 1934-08-01

## 2020-08-28 ENCOUNTER — Ambulatory Visit (INDEPENDENT_AMBULATORY_CARE_PROVIDER_SITE_OTHER): Payer: Medicare Other

## 2020-08-28 ENCOUNTER — Other Ambulatory Visit: Payer: Self-pay | Admitting: Family Medicine

## 2020-08-28 ENCOUNTER — Ambulatory Visit: Payer: Self-pay | Admitting: Family Medicine

## 2020-08-28 DIAGNOSIS — Z0000 Encounter for general adult medical examination without abnormal findings: Secondary | ICD-10-CM | POA: Diagnosis not present

## 2020-08-28 DIAGNOSIS — I10 Essential (primary) hypertension: Secondary | ICD-10-CM

## 2020-08-28 NOTE — Patient Instructions (Signed)
[name] , Thank you for taking time to come for your Medicare Wellness Visit. I appreciate your ongoing commitment to your health goals. Please review the following plan we discussed and let me know if I can assist you in the future.   Screening recommendations/referrals: Colonoscopy: No longer required.  Recommended yearly ophthalmology/optometry visit for glaucoma screening and checkup Recommended yearly dental visit for hygiene and checkup  Vaccinations: Influenza vaccine: Done 07/17/20 Pneumococcal vaccine: Completed series Tdap vaccine: Up to date, due 06/2026 Shingles vaccine: Shingrix discussed. Please contact your pharmacy for coverage information.     Advanced directives: Currently on file.  Conditions/risks identified: Fall risk preventatives discussed today.   Next appointment: 09/04/2021 @ 9:00 AM for an AWV call. Declined scheduling a follow up with PCP at this time.   Preventive Care 63 Years and Older, Male Preventive care refers to lifestyle choices and visits with your health care provider that can promote health and wellness. What does preventive care include?  A yearly physical exam. This is also called an annual well check.  Dental exams once or twice a year.  Routine eye exams. Ask your health care provider how often you should have your eyes checked.  Personal lifestyle choices, including:  Daily care of your teeth and gums.  Regular physical activity.  Eating a healthy diet.  Avoiding tobacco and drug use.  Limiting alcohol use.  Practicing safe sex.  Taking low doses of aspirin every day.  Taking vitamin and mineral supplements as recommended by your health care provider. What happens during an annual well check? The services and screenings done by your health care provider during your annual well check will depend on your age, overall health, lifestyle risk factors, and family history of disease. Counseling  Your health care provider may  ask you questions about your:  Alcohol use.  Tobacco use.  Drug use.  Emotional well-being.  Home and relationship well-being.  Sexual activity.  Eating habits.  History of falls.  Memory and ability to understand (cognition).  Work and work Astronomer. Screening  You may have the following tests or measurements:  Height, weight, and BMI.  Blood pressure.  Lipid and cholesterol levels. These may be checked every 5 years, or more frequently if you are over 57 years old.  Skin check.  Lung cancer screening. You may have this screening every year starting at age 60 if you have a 30-pack-year history of smoking and currently smoke or have quit within the past 15 years.  Fecal occult blood test (FOBT) of the stool. You may have this test every year starting at age 62.  Flexible sigmoidoscopy or colonoscopy. You may have a sigmoidoscopy every 5 years or a colonoscopy every 10 years starting at age 58.  Prostate cancer screening. Recommendations will vary depending on your family history and other risks.  Hepatitis C blood test.  Hepatitis B blood test.  Sexually transmitted disease (STD) testing.  Diabetes screening. This is done by checking your blood sugar (glucose) after you have not eaten for a while (fasting). You may have this done every 1-3 years.  Abdominal aortic aneurysm (AAA) screening. You may need this if you are a current or former smoker.  Osteoporosis. You may be screened starting at age 27 if you are at high risk. Talk with your health care provider about your test results, treatment options, and if necessary, the need for more tests. Vaccines  Your health care provider may recommend certain vaccines, such as:  Influenza vaccine. This is recommended every year.  Tetanus, diphtheria, and acellular pertussis (Tdap, Td) vaccine. You may need a Td booster every 10 years.  Zoster vaccine. You may need this after age 55.  Pneumococcal 13-valent  conjugate (PCV13) vaccine. One dose is recommended after age 73.  Pneumococcal polysaccharide (PPSV23) vaccine. One dose is recommended after age 11. Talk to your health care provider about which screenings and vaccines you need and how often you need them. This information is not intended to replace advice given to you by your health care provider. Make sure you discuss any questions you have with your health care provider. Document Released: 09/14/2015 Document Revised: 05/07/2016 Document Reviewed: 06/19/2015 Elsevier Interactive Patient Education  2017 [place] Prevention in the Home Falls can cause injuries. They can happen to people of all ages. There are many things you can do to make your home safe and to help prevent falls. What can I do on the outside of my home?  Regularly fix the edges of walkways and driveways and fix any cracks.  Remove anything that might make you trip as you walk through a door, such as a raised step or threshold.  Trim any bushes or trees on the path to your home.  Use bright outdoor lighting.  Clear any walking paths of anything that might make someone trip, such as rocks or tools.  Regularly check to see if handrails are loose or broken. Make sure that both sides of any steps have handrails.  Any raised decks and porches should have guardrails on the edges.  Have any leaves, snow, or ice cleared regularly.  Use sand or salt on walking paths during winter.  Clean up any spills in your garage right away. This includes oil or grease spills. What can I do in the bathroom?  Use night lights.  Install grab bars by the toilet and in the tub and shower. Do not use towel bars as grab bars.  Use non-skid mats or decals in the tub or shower.  If you need to sit down in the shower, use a plastic, non-slip stool.  Keep the floor dry. Clean up any water that spills on the floor as soon as it happens.  Remove soap buildup in the tub or  shower regularly.  Attach bath mats securely with double-sided non-slip rug tape.  Do not have throw rugs and other things on the floor that can make you trip. What can I do in the bedroom?  Use night lights.  Make sure that you have a light by your bed that is easy to reach.  Do not use any sheets or blankets that are too big for your bed. They should not hang down onto the floor.  Have a firm chair that has side arms. You can use this for support while you get dressed.  Do not have throw rugs and other things on the floor that can make you trip. What can I do in the kitchen?  Clean up any spills right away.  Avoid walking on wet floors.  Keep items that you use a lot in easy-to-reach places.  If you need to reach something above you, use a strong step stool that has a grab bar.  Keep electrical cords out of the way.  Do not use floor polish or wax that makes floors slippery. If you must use wax, use non-skid floor wax.  Do not have throw rugs and other things on the floor that  can make you trip. What can I do with my stairs?  Do not leave any items on the stairs.  Make sure that there are handrails on both sides of the stairs and use them. Fix handrails that are broken or loose. Make sure that handrails are as long as the stairways.  Check any carpeting to make sure that it is firmly attached to the stairs. Fix any carpet that is loose or worn.  Avoid having throw rugs at the top or bottom of the stairs. If you do have throw rugs, attach them to the floor with carpet tape.  Make sure that you have a light switch at the top of the stairs and the bottom of the stairs. If you do not have them, ask someone to add them for you. What else can I do to help prevent falls?  Wear shoes that:  Do not have high heels.  Have rubber bottoms.  Are comfortable and fit you well.  Are closed at the toe. Do not wear sandals.  If you use a stepladder:  Make sure that it is fully  opened. Do not climb a closed stepladder.  Make sure that both sides of the stepladder are locked into place.  Ask someone to hold it for you, if possible.  Clearly mark and make sure that you can see:  Any grab bars or handrails.  First and last steps.  Where the edge of each step is.  Use tools that help you move around (mobility aids) if they are needed. These include:  Canes.  Walkers.  Scooters.  Crutches.  Turn on the lights when you go into a dark area. Replace any light bulbs as soon as they burn out.  Set up your furniture so you have a clear path. Avoid moving your furniture around.  If any of your floors are uneven, fix them.  If there are any pets around you, be aware of where they are.  Review your medicines with your doctor. Some medicines can make you feel dizzy. This can increase your chance of falling. Ask your doctor what other things that you can do to help prevent falls. This information is not intended to replace advice given to you by your health care provider. Make sure you discuss any questions you have with your health care provider. Document Released: 06/14/2009 Document Revised: 01/24/2016 Document Reviewed: 09/22/2014 Elsevier Interactive Patient Education  2017 [employer].

## 2020-08-30 ENCOUNTER — Ambulatory Visit: Payer: Medicare Other

## 2020-08-31 ENCOUNTER — Other Ambulatory Visit: Payer: Self-pay | Admitting: Cardiology

## 2020-09-03 NOTE — Telephone Encounter (Signed)
Prescription refill request for Eliquis received. Indication: atrial fibrillation Last office visit: 08/2020 luke Scr: 0.98 05/2020 Age: 87 Weight:78 kg  Prescription refilled

## 2020-09-04 ENCOUNTER — Other Ambulatory Visit: Payer: Self-pay

## 2020-09-04 ENCOUNTER — Ambulatory Visit: Payer: Medicare Other | Attending: Nurse Practitioner

## 2020-09-04 DIAGNOSIS — R262 Difficulty in walking, not elsewhere classified: Secondary | ICD-10-CM

## 2020-09-04 DIAGNOSIS — M545 Low back pain, unspecified: Secondary | ICD-10-CM | POA: Insufficient documentation

## 2020-09-04 DIAGNOSIS — M6281 Muscle weakness (generalized): Secondary | ICD-10-CM | POA: Diagnosis present

## 2020-09-04 DIAGNOSIS — R2681 Unsteadiness on feet: Secondary | ICD-10-CM | POA: Insufficient documentation

## 2020-09-04 DIAGNOSIS — G8929 Other chronic pain: Secondary | ICD-10-CM | POA: Diagnosis present

## 2020-09-04 DIAGNOSIS — R2689 Other abnormalities of gait and mobility: Secondary | ICD-10-CM | POA: Insufficient documentation

## 2020-09-04 NOTE — Therapy (Signed)
[place] MAIN [facility] SERVICES [address] [place], [place], [address] Phone: [phone]   Fax:  [phone]  Physical Therapy Treatment  Patient Details  Name: SHADI SESSLER MRN: [id number] Date of Birth: 15-Aug-1934 Referring Provider (PT): Jennings Books K    Encounter Date: 09/04/2020   PT End of Session - 09/04/20 0930    Visit Number 13    Number of Visits 24    Date for PT Re-Evaluation 10/04/20    Authorization Type 3/10    PT Start Time 0930    PT Stop Time 1010    PT Time Calculation (min) 40 min    Equipment Utilized During Treatment Gait belt    Activity Tolerance Patient tolerated treatment well;Patient limited by fatigue    Behavior During Therapy WFL for tasks assessed/performed           Past Medical History:  Diagnosis Date  . AAA (abdominal aortic aneurysm) ([place]) 05/2019   Relativley Rapid progression from June-Aug 2020 (4.6 - 5.4 cm): s/p EVAR (Dr. [facility]): EVAR - 23 mm prox, 12 cm distal & x 18 cm length Gore Excluder Endoprosthesis Main body (via RFA distal to lowest Renal A); 89m x 14 cm L Contralateral Limb for L Iliac - extended with 8 mm x 6cm LifeStar stent (post-dilated with 7 mm DEB).  Additional R Iliac PTA not required.   . 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 ([place] 09/15/2017   09-15-2017 DIAGNOSIS:  A. COLON POLYP X 2, ASCENDING; COLD SNARE:  - TUBULAR ADENOMA.  - NEGATIVE FOR HIGH-GRADE DYSPLASIA AND MALIGNANCY.  - NO ADDITIONAL TISSUE IDENTIFIED; FECAL MATERIAL PRESENT.   B. COLON POLYP X 2, DESCENDING; COLD SNARE:  - ADENOCARCINOMA ARISING IN A DYSPLASTIC SESSILE SERRATED ADENOMA, WITH  LYMPHOVASCULAR INVASION, SEE COMMENT.  - TUBULAR ADENOMA, 2 FRAGMENTS, NEGATIVE FO  . Arthritis   . Basal cell carcinoma of eyelid 01/03/2013   2019 - R side of Nose  . Benign neoplasm of ascending colon   . Benign neoplasm of descending colon   . Bilateral iliac artery stenosis ([place] 2007   Bilateral Iliac A stenting;  extension of EVAR limbs into both Iliacs -- additional stent placed in L Iliac.  . Cancer ([place]    Skin cancer  . Colon polyps   . Complication of anesthesia    severe confusion agitation requiring hospital admission x 4 days  . Compression fracture of fourth lumbar vertebra ([place] 04/2019   - s/p Kyphoplasty; T12, L3-5 ([facility]  . COPD (chronic obstructive pulmonary disease) (HCC)    Centrilobular Emphysema  . Coronary artery disease involving native coronary artery without angina pectoris 1998   - s/p CABG, occluded SVG-D1; patent LIMA-LAD, SVG-OM, SVG- RCA  . Dementia ([facility]    memory loss - started noting late 2019  . Falls frequently    broke foot  . Fracture 2021   left foot  . GERD (gastroesophageal reflux disease)   . Hemorrhoid 02/10/2015  . History of hiatal hernia   . History of kidney stones   . HOH (hard of hearing)   . Hypertension   . Lewy body dementia ([facility] 04/04/2020  . Macular degeneration disease   . Osteoporosis   . PAF (paroxysmal atrial fibrillation) ([place]    on Eliquis for OAC - Rate Control   . Prostate cancer ([place]   . Prostate disorder   . Sciatic pain    Chronic  . Sleep apnea    cpap  .  Temporary cerebral vascular dysfunction 02/10/2015   Had negative work-up.  July, 2012.  No medication changes, had forgot them that day, got dehydrated.   . Urinary incontinence     Past Surgical History:  Procedure Laterality Date  . ABDOMINAL AORTIC ENDOVASCULAR STENT GRAFT Bilateral 05/18/2019   Procedure: ABDOMINAL AORTIC ENDOVASCULAR STENT GRAFT;  Surgeon: Algernon Huxley, MD;  Location: [facility] ORS;  Service: Vascular;  Laterality: Bilateral;  . ANKLE SURGERY Left    ORIF   . Arch aortogram and carotid aortogram  06/02/2006   Dr Oneida Alar did surgery  . BASAL CELL CARCINOMA EXCISION  04/2016   Dermatology  . CARDIAC CATHETERIZATION  01/30/1998    (Dr. Linard Millers): Native LAD & mRCA CTO. 90% D1. Mod Cx. SVG-D1 CTO. SVG-RCA & SVG-OM along with LIMA-LAD patent;  LV  NORMAL Fxn  . CAROTID ENDARTERECTOMY Left Oct. 29, 2007   Dr. Oneida Alar:  . CATARACT EXTRACTION W/PHACO Right 03/05/2017   Procedure: CATARACT EXTRACTION PHACO AND INTRAOCULAR LENS PLACEMENT (IOC);  Surgeon: Leandrew Koyanagi, MD;  Location: [facility] ORS;  Service: Ophthalmology;  Laterality: Right;  [place] 00:58.5 AP% 10.6 CDE 6.20 Fluid Pack lot # [id number]  . CATARACT EXTRACTION W/PHACO Left 04/15/2017   Procedure: CATARACT EXTRACTION PHACO AND INTRAOCULAR LENS PLACEMENT ([place])  Left;  Surgeon: Leandrew Koyanagi, MD;  Location: [place];  Service: Ophthalmology;  Laterality: Left;  . COLONOSCOPY WITH PROPOFOL N/A 09/15/2017   Procedure: COLONOSCOPY WITH PROPOFOL;  Surgeon: Lucilla Lame, MD;  [facility] END[facility]COPY;  Service: Endoscopy;  Laterality: N/A;  . COLONOSCOPY WITH PROPOFOL N/A 10/26/2018   Procedure: COLONOSCOPY WITH PROPOFOL;  Surgeon: Lucilla Lame, MD;  Location: [facility] ENDOSCOPY;  Service: Endoscopy;  Laterality: N/A;  . CORONARY ARTERY BYPASS GRAFT  1998   LIMA-LAD, SVG-OM, SVG-RPDA, SVG-DIAG  . CPET / MET  12/2012   Mild chronotropic incompetence - read 82% of predicted; also reduced effort; peak VO2 15.7 / 75% (did not reach Max effort) -- suggested ischemic response in last 1.5 minutes of exercise. Normal pulmonary function on PFTs but poor response to  . CYSTOSCOPY W/ RETROGRADES Left 01/13/2020   Procedure: CYSTOSCOPY WITH RETROGRADE PYELOGRAM;  Surgeon: Billey Co, MD;  Location: [facility] ORS;  Service: Urology;  Laterality: Left;  . CYSTOSCOPY WITH STENT PLACEMENT Left 12/26/2019   Procedure: CYSTOSCOPY WITH STENT PLACEMENT;  Surgeon: Billey Co, MD;  Location: [facility] ORS;  Service: Urology;  Laterality: Left;  . CYSTOSCOPY/URETEROSCOPY/HOLMIUM LASER/STENT PLACEMENT Left 01/13/2020   Procedure: CYSTOSCOPY/URETEROSCOPY/HOLMIUM LASER/STENT EXCHANGE;  Surgeon: Billey Co, MD;  Location: [facility] ORS;  Service: Urology;  Laterality: Left;  . ENDOVASCULAR REPAIR/STENT  GRAFT N/A 05/18/2019   Procedure: ENDOVASCULAR REPAIR/STENT GRAFT;  Surgeon: Algernon Huxley, MD;  Location: [facility] INVASIVE CV LAB;; EVAR - 23 mm prox, 12 cm distal & x 18 cm length Gore Excluder Endoprosthesis Main body (via RFA distal to lowest Renal A); 12m x 14 cm L Contralateral Limb for L Iliac - extended with 8 mm x 6cm LifeStar stent; no additional R Iliac PTA needed.   . ESOPHAGOGASTRODUODENOSCOPY (EGD) WITH PROPOFOL N/A 09/15/2017   Procedure: ESOPHAGOGASTRODUODENOSCOPY (EGD) WITH PROPOFOL;  Surgeon: WLucilla Lame MD;  [facility];  Service: Endoscopy;  Laterality: N/A;  . FRACTURE SURGERY Right 03/19/2015   wrist  . FRACTURE SURGERY Left   . HIATAL HERNIA REPAIR    . ILIAC ARTERY STENT  12/15/2005   PTA and direct stenting rgt and lft common iliac arteries  . KYPHOPLASTY N/A 04/05/2019  Procedure: KYPHOPLASTY T12, L3, L4 L5;  Surgeon: Hessie Knows, MD;  Location: [facility] ORS;  Service: Orthopedics;  Laterality: N/A;  . NM GATED MYOVIEW ([place] HX)  05/'19; 8/'20   a) [facility]: Low Risk - no ischemia or infarct.  EF > 65%. no RWMA;; b) [facility]): Normal wall motion.  No ischemia or infarction.  . OPEN REDUCTION INTERNAL FIXATION (ORIF) DISTAL RADIAL FRACTURE Left 01/13/2019   Procedure: OPEN REDUCTION INTERNAL FIXATION (ORIF) DISTAL  RADIAL FRACTURE - LEFT - SLEEP APNEA;  Surgeon: Hessie Knows, MD;  Location: [facility] ORS;  Service: Orthopedics;  Laterality: Left;  . ORIF ELBOW FRACTURE Right 01/01/2017   Procedure: OPEN REDUCTION INTERNAL FIXATION (ORIF) ELBOW/OLECRANON FRACTURE;  Surgeon: Hessie Knows, MD;  Location: [facility] ORS;  Service: Orthopedics;  Laterality: Right;  . ORIF WRIST FRACTURE Right 03/19/2015   Procedure: OPEN REDUCTION INTERNAL FIXATION (ORIF) WRIST FRACTURE;  Surgeon: Hessie Knows, MD;  Location: [facility] ORS;  Service: Orthopedics;  Laterality: Right;  . SHOULDER ARTHROSCOPY Right   . THORACIC AORTA - CAROTID ANGIOGRAM  October 2007   Dr. 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 TRANSTHORACIC ECHOCARDIOGRAM  February 2016   [facility]: Normal LV function. Dilated left atrium.  Domingo Dimes ECHOCARDIOGRAM  04/2019   [facility] April 20, 2019): EF 55-60 %.  Mild LVH.  Relatively normal valves.    There were no vitals filed for this visit.   Subjective Assessment - 09/04/20 0940    Subjective Patient reported no complaints today, no falls or stumbles since he was seen last. did have a bandage on his R hand, reported his MD is aware of it    Pertinent History Patient is returning to PT for evaluation of balance. Patient has been seen by this therapist in the past. PMH AAA, arthritis, basal cell carcinoma of eyelid, benign neoplasm of ascending/descending colon, bilateral iliac artery stenosis, colon polyps, compression fx of 4th lumbar vertebra (s/p kyphoplasty T12, L3-5), COPD, CAD, dementia, falls,  GERD, hemorroids, HOH, HTN, Lewy Body Dementia, macular degeneration disease, osteoporosis, PAF, prostate cancer, prostate disorder, sciatic pain, sleep apnea, temporary cerebral vascular dysfunction. Comes with aide, having multiple near falls per day per aide, mainly to the sides.    Limitations Walking;Standing;House hold activities;Other (comment);Lifting;Sitting    How long can you sit comfortably? n/a back pain no longer limiting    How long can you stand comfortably? 10-12 with walker    How long can you walk comfortably? walks 15 minutes with a walker    Diagnostic tests MRI from [place] dated 03/28/2019 report was reviewed today. Acute compression fractures are noted at T12, L3, and L4. He has facet arthropathy at the lower lumbar levels. At L3-4 he has moderate central stenosis.    Patient Stated Goals walk without stumbling, be able to be steady enough to not have to have someone holding his gait belt.    Currently in Pain? No/denies    Pain  Onset In the past 7 days           Treatment Today:  NMR: throw balls at target. x 4 rounds pt to reach into case to grab ball and toss, no Ue support (needed tactile cues and reminders throughout intervention.)   Aide acted as moving target per round: feet together on foam  Aide acted as moving target per round feet together on foam  Aide acted as moving target modified  tandem on foam pt unable to do without UE support  Aide acted as moving target modified tandem on flat ground 1 round bilaterally   THEREX: 4lb ankle weights : -hip abduction 2x15 each LE, cues for upright posture -hip extension 2x15 each LE, cues for larger step length  -standing hamstring curls 2x12 cues for form     Seated: 4lb ankle weight:  -marches with arms crossed 2x15 -LAQ with arms crossed 2x15  -sit to stand 2x5   pt response/clinical impression: The patient was challenged by narrow base of support activities and multitasking on uneven surfaces. Needed several cues throughout to maintain exercise technique, and CGA-minA for safety. Pt endorsed fatigue at end of session but no complaints of pain. The patient would benefit from further skilled PT intervention to continue to progress towards goals.       PT Education - 09/04/20 0930    Education provided Yes    Education Details exercise technique    Person(s) Educated Patient    Methods Explanation;Demonstration;Tactile cues    Comprehension Verbalized understanding;Returned demonstration;Verbal cues required;Tactile cues required;Need further instruction            PT Short Term Goals - 08/21/20 0959      PT SHORT TERM GOAL #1   Title Patient will be independent in home exercise program to improve strength/mobility for better functional independence with ADLs.    Baseline 11/11: HEP given 12/21: HEP compliant    Time 4    Period Weeks    Status Achieved    Target Date 08/09/20             PT Long Term Goals - 08/21/20 0958      PT  LONG TERM GOAL #1   Title Patient will increase FOTO score to equal to or greater than 51% to demonstrate statistically significant improvement in mobility and quality of life.    Baseline 11/11: 42% 12/21: 46.8%    Time 12    Period Weeks    Status Partially Met    Target Date 10/04/20      PT LONG TERM GOAL #2   Title Patient will demonstrate an improved Berg Balance Score of > 40/56 as to demonstrate improved balance with ADLs such as sitting/standing and transfer balance and reduced fall risk.    Baseline 11/11: 34/56 12/21: 37/56    Time 12    Period Weeks    Status Partially Met    Target Date 10/04/20      PT LONG TERM GOAL #3   Title Patient (> 38 years old) will complete five times sit to stand test in < 15 seconds without UE support indicating an increased LE strength and improved balance    Baseline 11/11; 23.35 seconds with UE support 12/21: 27.85 with one near LOB    Time 12    Period Weeks    Status On-going    Target Date 10/04/20      PT LONG TERM GOAL #4   Title Patient will increase 10 meter walk test to >1.16ms with LRAD  as to improve gait speed for better community ambulation and to reduce fall risk.    Baseline 11/11: 0.74 m/s without AD with one near LOB 12/21: 1.08 m/s    Time 12    Period Weeks    Status Achieved      PT LONG TERM GOAL #5   Title Patient will increase BLE gross strength to 4+/5 as to improve functional strength  for independent gait, increased standing tolerance and increased ADL ability.    Baseline 11/11: see note    Time 12    Period Weeks    Status Partially Met    Target Date 10/04/20                 Plan - 09/04/20 0930    Clinical Impression Statement The patient was challenged by narrow base of support activities and multitasking on uneven surfaces. Needed several cues throughout to maintain exercise technique, and CGA-minA for safety. Pt endorsed fatigue at end of session but no complaints of pain. The patient would  benefit from further skilled PT intervention to continue to progress towards goals.    Personal Factors and Comorbidities Age;Comorbidity 3+;Fitness;Past/Current Experience;Sex;Social Background;Time since onset of injury/illness/exacerbation;Transportation    Comorbidities AAA, arthritis, atherosclerotic heart disease, (s/p CABG), benign neoplasm of ascending/descending colon, COPD, CAD, dementia, dysrhythmia, frequent falls, GERD, hemorrhoid, HOH, HTN, PAF, prostate cancer.    Examination-Activity Limitations Bathing;Bend;Carry;Dressing;Continence;Stairs;Squat;Reach Overhead;Locomotion Level;Lift;Stand;Transfers;Toileting;Bed Mobility    Examination-Participation Restrictions Church;Cleaning;Community Activity;Driving;Interpersonal Relationship;Laundry;Volunteer;Shop;Personal Finances;Meal Prep;Yard Work    Product manager    Rehab Potential Fair    Clinical Impairments Affecting Rehab Potential (+) good support system, high prior level of function (-) currently undergoing radiation therapy for prostate cancer, memory deficits     PT Frequency 2x / week    PT Duration 12 weeks    PT Treatment/Interventions ADLs/Self Care Home Management;Aquatic Therapy;Cryotherapy;Moist Heat;Traction;Ultrasound;Therapeutic activities;Functional mobility training;Stair training;Gait training;DME Instruction;Therapeutic exercise;Balance training;Neuromuscular re-education;Patient/family education;Manual techniques;Passive range of motion;Energy conservation;Vestibular;Taping;Biofeedback;Electrical Stimulation;Iontophoresis 30m/ml Dexamethasone;Dry needling;Visual/perceptual remediation/compensation;Cognitive remediation;Canalith Repostioning    PT Next Visit Plan balance, strength    PT Home Exercise Plan see above    Consulted and Agree with Plan of Care Patient;Family member/caregiver    Family Member Consulted aide           Patient will benefit from skilled  therapeutic intervention in order to improve the following deficits and impairments:  Abnormal gait,Cardiopulmonary status limiting activity,Decreased activity tolerance,Decreased balance,Decreased knowledge of precautions,Decreased endurance,Decreased coordination,Decreased cognition,Decreased knowledge of use of DME,Decreased mobility,Difficulty walking,Decreased safety awareness,Decreased strength,Impaired flexibility,Impaired perceived functional ability,Impaired sensation,Postural dysfunction,Improper body mechanics,Pain,Impaired vision/preception,Decreased range of motion  Visit Diagnosis: Unsteadiness on feet  Muscle weakness (generalized)  Other abnormalities of gait and mobility  Chronic low back pain, unspecified back pain laterality, unspecified whether sciatica present  Difficulty in walking, not elsewhere classified     Problem List Patient Active Problem List   Diagnosis Date Noted  . AAA (abdominal aortic aneurysm) without rupture ([place] 06/29/2020  . LBD (Lewy body dementia) ([place] 05/14/2020  . Numbness and tingling in left hand 03/02/2020  . Hematuria   . Acute kidney injury superimposed on CKD ([place]   . Sepsis due to gram-negative UTI ([place] 12/26/2019  . Left nephrolithiasis 12/26/2019  . Hydronephrosis, left 12/26/2019  . Status post abdominal aortic aneurysm (AAA) repair 04/15/2019  . Do not intubate, cardiopulmonary resuscitation (CPR)-only code status 10/08/2018  . Prostate cancer ([place] 02/25/2018  . Prostatic cyst 01/15/2018  . Blood in stool   . Abnormal feces   . Stricture and stenosis of esophagus   . Mild cognitive impairment 08/18/2017  . Senile purpura ([place] 04/29/2017  . Anemia 04/29/2017  . Frequent falls 04/29/2017  . Simple chronic bronchitis ([place] 12/17/2016  . Benign localized hyperplasia of prostate with urinary obstruction 07/15/2016  . Elevated PSA 07/15/2016  . Incomplete emptying of bladder 07/15/2016  . Urge incontinence 07/15/2016  .  Hypothyroidism 04/25/2016  .  Macular degeneration 04/25/2016  . Erectile dysfunction due to arterial insufficiency   . Ischemic heart disease with chronotropic incompetence   . CN (constipation) 02/10/2015  . DD (diverticular disease) 02/10/2015  . Weakness 02/10/2015  . Lichen planus 40/69/8614  . Restless leg 02/10/2015  . 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 B12 deficiency 02/10/2015  . COPD (chronic obstructive pulmonary disease) ([place]) 11/14/2014  . Post Inflammatory Lung Changes 11/14/2014  . PAF (paroxysmal atrial fibrillation) (HCC) CHA2DS2-VASc = 4. AC = Eliquis   . Insomnia 12/09/2013  . OSA on CPAP   . Dyspnea on exertion - -essentially resolved with BB dose reduction & wgt loss 03/29/2013  . Overweight (BMI 25.0-29.9) -- Wgt back up 01/17/2013  . Hx of CABG   . Essential hypertension   . Hyperlipidemia with target LDL less than 70   . Aorto-iliac disease ([place])   . BCC (basal cell carcinoma), eyelid 01/03/2013  . Allergic rhinitis 08/17/2009  . Adaptation reaction 05/28/2009  . Acid reflux 05/28/2009  . Arthritis, degenerative 05/28/2009  . 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 Carotid artery occlusion without infarction 06/01/2006  . PAD (peripheral artery disease) - bilateral common iliac stents 12/15/2005  . Atherosclerotic heart disease of artery bypass graft 09/01/1997    Lieutenant Diego PT, DPT 10:17 AM,09/04/20   [place] MAIN [facility] SERVICES [address] [place], [place], [address] Phone: [phone]   Fax:  [phone]  Name: RANNIE CRANEY MRN: [id number] Date of Birth: 04-20-1934

## 2020-09-06 ENCOUNTER — Other Ambulatory Visit: Payer: Self-pay

## 2020-09-06 ENCOUNTER — Ambulatory Visit: Payer: Medicare Other

## 2020-09-06 DIAGNOSIS — R2689 Other abnormalities of gait and mobility: Secondary | ICD-10-CM

## 2020-09-06 DIAGNOSIS — R2681 Unsteadiness on feet: Secondary | ICD-10-CM | POA: Diagnosis not present

## 2020-09-06 DIAGNOSIS — G8929 Other chronic pain: Secondary | ICD-10-CM

## 2020-09-06 DIAGNOSIS — M6281 Muscle weakness (generalized): Secondary | ICD-10-CM

## 2020-09-06 DIAGNOSIS — M545 Low back pain, unspecified: Secondary | ICD-10-CM

## 2020-09-06 NOTE — Therapy (Signed)
[place] MAIN [facility] SERVICES [address] [place], [place], [address] Phone: [phone]   Fax:  [phone]  Physical Therapy Treatment  Patient Details  Name: [name] MRN: [id number] Date of Birth: 09-Jun-1934 Referring Provider (PT): Jennings Books K    Encounter Date: 09/06/2020   PT End of Session - 09/06/20 1709    Visit Number 14    Number of Visits 24    Date for PT Re-Evaluation 10/04/20    Authorization Type 4/10    PT Start Time 0930    PT Stop Time 1012    PT Time Calculation (min) 42 min    Equipment Utilized During Treatment Gait belt    Activity Tolerance Patient tolerated treatment well;Patient limited by fatigue    Behavior During Therapy WFL for tasks assessed/performed           Past Medical History:  Diagnosis Date  . AAA (abdominal aortic aneurysm) ([place]) 05/2019   [place] Rapid progression from June-Aug 2020 (4.6 - 5.4 cm): s/p EVAR (Dr. Lucky Cowboy [facility]): EVAR - 23 mm prox, 12 cm distal & x 18 cm length Gore Excluder Endoprosthesis Main body (via RFA distal to lowest Renal A); 66m x 14 cm L Contralateral Limb for L Iliac - extended with 8 mm x 6cm LifeStar stent (post-dilated with 7 mm DEB).  Additional R Iliac PTA not required.   . 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 ([place] 09/15/2017   09-15-2017 DIAGNOSIS:  A. COLON POLYP X 2, ASCENDING; COLD SNARE:  - TUBULAR ADENOMA.  - NEGATIVE FOR HIGH-GRADE DYSPLASIA AND MALIGNANCY.  - NO ADDITIONAL TISSUE IDENTIFIED; FECAL MATERIAL PRESENT.   B. COLON POLYP X 2, DESCENDING; COLD SNARE:  - ADENOCARCINOMA ARISING IN A DYSPLASTIC SESSILE SERRATED ADENOMA, WITH  LYMPHOVASCULAR INVASION, SEE COMMENT.  - TUBULAR ADENOMA, 2 FRAGMENTS, NEGATIVE FO  . Arthritis   . Basal cell carcinoma of eyelid 01/03/2013   2019 - R side of Nose  . Benign neoplasm of ascending colon   . Benign neoplasm of descending colon   . Bilateral iliac artery stenosis ([place] 2007   Bilateral Iliac A stenting;  extension of EVAR limbs into both Iliacs -- additional stent placed in L Iliac.  . Cancer ([place]    Skin cancer  . Colon polyps   . Complication of anesthesia    severe confusion agitation requiring hospital admission x 4 days  . Compression fracture of fourth lumbar vertebra ([place] 04/2019   - s/p Kyphoplasty; T12, L3-5 ([facility]  . COPD (chronic obstructive pulmonary disease) (HCC)    Centrilobular Emphysema  . Coronary artery disease involving native coronary artery without angina pectoris 1998   - s/p CABG, occluded SVG-D1; patent LIMA-LAD, SVG-OM, SVG- RCA  . Dementia ([place]    memory loss - started noting late 2019  . Falls frequently    broke foot  . Fracture 2021   left foot  . GERD (gastroesophageal reflux disease)   . Hemorrhoid 02/10/2015  . History of hiatal hernia   . History of kidney stones   . HOH (hard of hearing)   . Hypertension   . Lewy body dementia ([place] 04/04/2020  . Macular degeneration disease   . Osteoporosis   . PAF (paroxysmal atrial fibrillation) ([place]    on Eliquis for OAC - Rate Control   . Prostate cancer ([place]   . Prostate disorder   . Sciatic pain    Chronic  . Sleep apnea    cpap  .  Temporary cerebral vascular dysfunction 02/10/2015   Had negative work-up.  July, 2012.  No medication changes, had forgot them that day, got dehydrated.   . Urinary incontinence     Past Surgical History:  Procedure Laterality Date  . ABDOMINAL AORTIC ENDOVASCULAR STENT GRAFT Bilateral 05/18/2019   Procedure: ABDOMINAL AORTIC ENDOVASCULAR STENT GRAFT;  Surgeon: Algernon Huxley, MD;  Location: [facility] ORS;  Service: Vascular;  Laterality: Bilateral;  . ANKLE SURGERY Left    ORIF   . Arch aortogram and carotid aortogram  06/02/2006   Dr Oneida Alar did surgery  . BASAL CELL CARCINOMA EXCISION  04/2016   Dermatology  . CARDIAC CATHETERIZATION  01/30/1998    (Dr. Linard Millers): Native LAD & mRCA CTO. 90% D1. Mod Cx. SVG-D1 CTO. SVG-RCA & SVG-OM along with LIMA-LAD patent;  LV  NORMAL Fxn  . CAROTID ENDARTERECTOMY Left Oct. 29, 2007   Dr. Oneida Alar:  . CATARACT EXTRACTION W/PHACO Right 03/05/2017   Procedure: CATARACT EXTRACTION PHACO AND INTRAOCULAR LENS PLACEMENT (IOC);  Surgeon: Leandrew Koyanagi, MD;  Location: [facility] ORS;  Service: Ophthalmology;  Laterality: Right;  [place] 00:58.5 AP% 10.6 CDE 6.20 Fluid Pack lot # [id number]  . CATARACT EXTRACTION W/PHACO Left 04/15/2017   Procedure: CATARACT EXTRACTION PHACO AND INTRAOCULAR LENS PLACEMENT ([place])  Left;  Surgeon: Leandrew Koyanagi, MD;  Location: [place];  Service: Ophthalmology;  Laterality: Left;  . COLONOSCOPY WITH PROPOFOL N/A 09/15/2017   Procedure: COLONOSCOPY WITH PROPOFOL;  Surgeon: Lucilla Lame, MD;  Location: [facility] ENDOSCOPY;  Service: Endoscopy;  Laterality: N/A;  . COLONOSCOPY WITH PROPOFOL N/A 10/26/2018   Procedure: COLONOSCOPY WITH PROPOFOL;  Surgeon: Lucilla Lame, MD;  Location: [facility] ENDOSCOPY;  Service: Endoscopy;  Laterality: N/A;  . CORONARY ARTERY BYPASS GRAFT  1998   LIMA-LAD, SVG-OM, SVG-RPDA, SVG-DIAG  . CPET / MET  12/2012   Mild chronotropic incompetence - read 82% of predicted; also reduced effort; peak VO2 15.7 / 75% (did not reach Max effort) -- suggested ischemic response in last 1.5 minutes of exercise. Normal pulmonary function on PFTs but poor response to  . CYSTOSCOPY W/ RETROGRADES Left 01/13/2020   Procedure: CYSTOSCOPY WITH RETROGRADE PYELOGRAM;  Surgeon: Billey Co, MD;  Location: [facility] ORS;  Service: Urology;  Laterality: Left;  . CYSTOSCOPY WITH STENT PLACEMENT Left 12/26/2019   Procedure: CYSTOSCOPY WITH STENT PLACEMENT;  Surgeon: Billey Co, MD;  Location: [facility] ORS;  Service: Urology;  Laterality: Left;  . CYSTOSCOPY/URETEROSCOPY/HOLMIUM LASER/STENT PLACEMENT Left 01/13/2020   Procedure: CYSTOSCOPY/URETEROSCOPY/HOLMIUM LASER/STENT EXCHANGE;  Surgeon: Billey Co, MD;  Location: [facility] ORS;  Service: Urology;  Laterality: Left;  . ENDOVASCULAR REPAIR/STENT  GRAFT N/A 05/18/2019   Procedure: ENDOVASCULAR REPAIR/STENT GRAFT;  Surgeon: Algernon Huxley, MD;  Location: [facility] INVASIVE CV LAB;; EVAR - 23 mm prox, 12 cm distal & x 18 cm length Gore Excluder Endoprosthesis Main body (via RFA distal to lowest Renal A); 68m x 14 cm L Contralateral Limb for L Iliac - extended with 8 mm x 6cm LifeStar stent; no additional R Iliac PTA needed.   . ESOPHAGOGASTRODUODENOSCOPY (EGD) WITH PROPOFOL N/A 09/15/2017   Procedure: ESOPHAGOGASTRODUODENOSCOPY (EGD) WITH PROPOFOL;  Surgeon: WLucilla Lame MD;  [facility];  Service: Endoscopy;  Laterality: N/A;  . FRACTURE SURGERY Right 03/19/2015   wrist  . FRACTURE SURGERY Left   . HIATAL HERNIA REPAIR    . ILIAC ARTERY STENT  12/15/2005   PTA and direct stenting rgt and lft common iliac arteries  . KYPHOPLASTY N/A 04/05/2019  Procedure: KYPHOPLASTY T12, L3, L4 L5;  Surgeon: Hessie Knows, MD;  Location: [facility] ORS;  Service: Orthopedics;  Laterality: N/A;  . NM GATED MYOVIEW ([place] HX)  05/'19; 8/'20   a) [facility]: Low Risk - no ischemia or infarct.  EF > 65%. no RWMA;; b) [facility]): Normal wall motion.  No ischemia or infarction.  . OPEN REDUCTION INTERNAL FIXATION (ORIF) DISTAL RADIAL FRACTURE Left 01/13/2019   Procedure: OPEN REDUCTION INTERNAL FIXATION (ORIF) DISTAL  RADIAL FRACTURE - LEFT - SLEEP APNEA;  Surgeon: Hessie Knows, MD;  Location: [facility] ORS;  Service: Orthopedics;  Laterality: Left;  . ORIF ELBOW FRACTURE Right 01/01/2017   Procedure: OPEN REDUCTION INTERNAL FIXATION (ORIF) ELBOW/OLECRANON FRACTURE;  Surgeon: Hessie Knows, MD;  Location: [facility] ORS;  Service: Orthopedics;  Laterality: Right;  . ORIF WRIST FRACTURE Right 03/19/2015   Procedure: OPEN REDUCTION INTERNAL FIXATION (ORIF) WRIST FRACTURE;  Surgeon: Hessie Knows, MD;  Location: [facility] ORS;  Service: Orthopedics;  Laterality: Right;  . SHOULDER ARTHROSCOPY Right   . THORACIC AORTA - CAROTID ANGIOGRAM  October 2007   Dr. 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 TRANSTHORACIC ECHOCARDIOGRAM  February 2016   [facility]: Normal LV function. Dilated left atrium.  Domingo Dimes ECHOCARDIOGRAM  04/2019   [facility] April 20, 2019): EF 55-60 %.  Mild LVH.  Relatively normal valves.    There were no vitals filed for this visit.   Subjective Assessment - 09/06/20 0935    Subjective Patient is accompanied with aide. Reports no falls or LOB. Continues to have bandaged right hand.    Pertinent History Patient is returning to PT for evaluation of balance. Patient has been seen by this therapist in the past. PMH AAA, arthritis, basal cell carcinoma of eyelid, benign neoplasm of ascending/descending colon, bilateral iliac artery stenosis, colon polyps, compression fx of 4th lumbar vertebra (s/p kyphoplasty T12, L3-5), COPD, CAD, dementia, falls,  GERD, hemorroids, HOH, HTN, Lewy Body Dementia, macular degeneration disease, osteoporosis, PAF, prostate cancer, prostate disorder, sciatic pain, sleep apnea, temporary cerebral vascular dysfunction. Comes with aide, having multiple near falls per day per aide, mainly to the sides.    Limitations Walking;Standing;House hold activities;Other (comment);Lifting;Sitting    How long can you sit comfortably? n/a back pain no longer limiting    How long can you stand comfortably? 10-12 with walker    How long can you walk comfortably? walks 15 minutes with a walker    Diagnostic tests MRI from [place] dated 03/28/2019 report was reviewed today. Acute compression fractures are noted at T12, L3, and L4. He has facet arthropathy at the lower lumbar levels. At L3-4 he has moderate central stenosis.    Patient Stated Goals walk without stumbling, be able to be steady enough to not have to have someone holding his gait belt.    Currently in Pain? No/denies    Pain Onset In the past 7 days                BP  at start of session: 132/61   TherEx: Nustep Lvl 4 RPM> 40 for cardiovascular and musculoskeletal challenge 4 minutes    Standing with # 4 ankle weight: CGA for stability -6 " step toe taps 12x each LE, BUE support  -Hip abduction with BUE upper extremity support, cueing for neutral foot alignment for correct muscle activation, 4x length of // bars -Hip flexion with BUE upper extremity  support, cueing for body mechanics, speed of muscle recruitment for optimal strengthening and stabilization 4x length of // bars  STS throw basketball at aide ; requires SUE to stand up and sit down 10x; very challenging for patient.    Neuro Re-ed:  airex pad: -obtain ball from package, throw at target for pertubations x 14 balls.  -throw rainbow ball back and forth with aide x 18 tosses; no LOB  -6" step modified tandem stance 40 second holds, more challenging with LLE posteriorly positioned.  -step over orange hurdle and back 12x each LE   10 cones around gym for spatial awareness and visual field scan; very challenging mod A required.   Half foam roller df/pf 15x with BUE support  Forward backward ambulation with 2x4 between feet to promote widened BOS; x4 trials initially challenging with posterior ambulation improved with repetition.     Pt educated throughout session about proper posture and technique with exercises. Improved exercise technique, movement at target joints, use of target muscles after min to mod verbal, visual, tactile cues.                                PT Education - 09/06/20 1708    Education provided Yes    Education Details exercise technique    Person(s) Educated Patient    Methods Explanation;Demonstration;Tactile cues;Verbal cues    Comprehension Verbalized understanding;Returned demonstration;Tactile cues required;Verbal cues required            PT Short Term Goals - 08/21/20 0959      PT SHORT TERM GOAL #1   Title Patient will be  independent in home exercise program to improve strength/mobility for better functional independence with ADLs.    Baseline 11/11: HEP given 12/21: HEP compliant    Time 4    Period Weeks    Status Achieved    Target Date 08/09/20             PT Long Term Goals - 08/21/20 0958      PT LONG TERM GOAL #1   Title Patient will increase FOTO score to equal to or greater than 51% to demonstrate statistically significant improvement in mobility and quality of life.    Baseline 11/11: 42% 12/21: 46.8%    Time 12    Period Weeks    Status Partially Met    Target Date 10/04/20      PT LONG TERM GOAL #2   Title Patient will demonstrate an improved Berg Balance Score of > 40/56 as to demonstrate improved balance with ADLs such as sitting/standing and transfer balance and reduced fall risk.    Baseline 11/11: 34/56 12/21: 37/56    Time 12    Period Weeks    Status Partially Met    Target Date 10/04/20      PT LONG TERM GOAL #3   Title Patient (> 37 years old) will complete five times sit to stand test in < 15 seconds without UE support indicating an increased LE strength and improved balance    Baseline 11/11; 23.35 seconds with UE support 12/21: 27.85 with one near LOB    Time 12    Period Weeks    Status On-going    Target Date 10/04/20      PT LONG TERM GOAL #4   Title Patient will increase 10 meter walk test to >1.41ms with LRAD  as to improve gait speed for  better community ambulation and to reduce fall risk.    Baseline 11/11: 0.74 m/s without AD with one near LOB 12/21: 1.08 m/s    Time 12    Period Weeks    Status Achieved      PT LONG TERM GOAL #5   Title Patient will increase BLE gross strength to 4+/5 as to improve functional strength for independent gait, increased standing tolerance and increased ADL ability.    Baseline 11/11: see note    Time 12    Period Weeks    Status Partially Met    Target Date 10/04/20                 Plan - 09/06/20 1713     Clinical Impression Statement Patient is challenged with gait mechanics with dual task and visual fields requiring cueing and tactile assistance for AD directions. Multitasking continues to be challenging on stable and unstable surfaces. His narrow base of support regresses as he fatigues. Patient will benefit from skilled physical therapy to increase strength, balance, and mobility to allow patient to reduce fall risk and increase independence in daily life.    Personal Factors and Comorbidities Age;Comorbidity 3+;Fitness;Past/Current Experience;Sex;Social Background;Time since onset of injury/illness/exacerbation;Transportation    Comorbidities AAA, arthritis, atherosclerotic heart disease, (s/p CABG), benign neoplasm of ascending/descending colon, COPD, CAD, dementia, dysrhythmia, frequent falls, GERD, hemorrhoid, HOH, HTN, PAF, prostate cancer.    Examination-Activity Limitations Bathing;Bend;Carry;Dressing;Continence;Stairs;Squat;Reach Overhead;Locomotion Level;Lift;Stand;Transfers;Toileting;Bed Mobility    Examination-Participation Restrictions Church;Cleaning;Community Activity;Driving;Interpersonal Relationship;Laundry;Volunteer;Shop;Personal Finances;Meal Prep;Yard Work    Product manager    Rehab Potential Fair    Clinical Impairments Affecting Rehab Potential (+) good support system, high prior level of function (-) currently undergoing radiation therapy for prostate cancer, memory deficits     PT Frequency 2x / week    PT Duration 12 weeks    PT Treatment/Interventions ADLs/Self Care Home Management;Aquatic Therapy;Cryotherapy;Moist Heat;Traction;Ultrasound;Therapeutic activities;Functional mobility training;Stair training;Gait training;DME Instruction;Therapeutic exercise;Balance training;Neuromuscular re-education;Patient/family education;Manual techniques;Passive range of motion;Energy conservation;Vestibular;Taping;Biofeedback;Electrical  Stimulation;Iontophoresis 46m/ml Dexamethasone;Dry needling;Visual/perceptual remediation/compensation;Cognitive remediation;Canalith Repostioning    PT Next Visit Plan balance, strength    PT Home Exercise Plan see above    Consulted and Agree with Plan of Care Patient;Family member/caregiver    Family Member Consulted aide           Patient will benefit from skilled therapeutic intervention in order to improve the following deficits and impairments:  Abnormal gait,Cardiopulmonary status limiting activity,Decreased activity tolerance,Decreased balance,Decreased knowledge of precautions,Decreased endurance,Decreased coordination,Decreased cognition,Decreased knowledge of use of DME,Decreased mobility,Difficulty walking,Decreased safety awareness,Decreased strength,Impaired flexibility,Impaired perceived functional ability,Impaired sensation,Postural dysfunction,Improper body mechanics,Pain,Impaired vision/preception,Decreased range of motion  Visit Diagnosis: Unsteadiness on feet  Muscle weakness (generalized)  Other abnormalities of gait and mobility  Chronic low back pain, unspecified back pain laterality, unspecified whether sciatica present     Problem List Patient Active Problem List   Diagnosis Date Noted  . AAA (abdominal aortic aneurysm) without rupture ([place] 06/29/2020  . LBD (Lewy body dementia) ([place] 05/14/2020  . Numbness and tingling in left hand 03/02/2020  . Hematuria   . Acute kidney injury superimposed on CKD ([place]   . Sepsis due to gram-negative UTI ([place] 12/26/2019  . Left nephrolithiasis 12/26/2019  . Hydronephrosis, left 12/26/2019  . Status post abdominal aortic aneurysm (AAA) repair 04/15/2019  . Do not intubate, cardiopulmonary resuscitation (CPR)-only code status 10/08/2018  . Prostate cancer ([place] 02/25/2018  . Prostatic cyst 01/15/2018  . Blood in stool   . Abnormal feces   .  Stricture and stenosis of esophagus   . Mild cognitive impairment  08/18/2017  . Senile purpura ([place]) 04/29/2017  . Anemia 04/29/2017  . Frequent falls 04/29/2017  . Simple chronic bronchitis ([place]) 12/17/2016  . Benign localized hyperplasia of prostate with urinary obstruction 07/15/2016  . Elevated PSA 07/15/2016  . Incomplete emptying of bladder 07/15/2016  . Urge incontinence 07/15/2016  . Hypothyroidism 04/25/2016  . Macular degeneration 04/25/2016  . Erectile dysfunction due to arterial insufficiency   . Ischemic heart disease with chronotropic incompetence   . CN (constipation) 02/10/2015  . DD (diverticular disease) 02/10/2015  . Weakness 02/10/2015  . Lichen planus 84/73/0856  . Restless leg 02/10/2015  . 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 B12 deficiency 02/10/2015  . COPD (chronic obstructive pulmonary disease) ([place]) 11/14/2014  . Post Inflammatory Lung Changes 11/14/2014  . PAF (paroxysmal atrial fibrillation) (HCC) CHA2DS2-VASc = 4. AC = Eliquis   . Insomnia 12/09/2013  . OSA on CPAP   . Dyspnea on exertion - -essentially resolved with BB dose reduction & wgt loss 03/29/2013  . Overweight (BMI 25.0-29.9) -- Wgt back up 01/17/2013  . Hx of CABG   . Essential hypertension   . Hyperlipidemia with target LDL less than 70   . Aorto-iliac disease ([place])   . BCC (basal cell carcinoma), eyelid 01/03/2013  . Allergic rhinitis 08/17/2009  . Adaptation reaction 05/28/2009  . Acid reflux 05/28/2009  . Arthritis, degenerative 05/28/2009  . 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 Carotid artery occlusion without infarction 06/01/2006  . PAD (peripheral artery disease) - bilateral common iliac stents 12/15/2005  . Atherosclerotic heart disease of artery bypass graft 09/01/1997   Janna Arch, PT, DPT   09/06/2020, 5:17 PM  [place] MAIN [facility] SERVICES [address] [place], [place], [address] Phone: [phone]   Fax:  [phone]  Name: [name] MRN: [id number] Date of Birth:  1934/03/18

## 2020-09-11 ENCOUNTER — Other Ambulatory Visit: Payer: Self-pay

## 2020-09-11 ENCOUNTER — Ambulatory Visit: Payer: Medicare Other

## 2020-09-11 DIAGNOSIS — R2689 Other abnormalities of gait and mobility: Secondary | ICD-10-CM

## 2020-09-11 DIAGNOSIS — R2681 Unsteadiness on feet: Secondary | ICD-10-CM | POA: Diagnosis not present

## 2020-09-11 DIAGNOSIS — M545 Low back pain, unspecified: Secondary | ICD-10-CM

## 2020-09-11 DIAGNOSIS — G8929 Other chronic pain: Secondary | ICD-10-CM

## 2020-09-11 DIAGNOSIS — M6281 Muscle weakness (generalized): Secondary | ICD-10-CM

## 2020-09-11 NOTE — Therapy (Signed)
[place] MAIN [facility] SERVICES [address] [place], [place], [address] Phone: [phone]   Fax:  [phone]  Physical Therapy Treatment  Patient Details  Name: [name] MRN: [id number] Date of Birth: 1933/09/25 Referring Provider (PT): Jennings Books K    Encounter Date: 09/11/2020   PT End of Session - 09/11/20 1301    Visit Number 15    Number of Visits 24    Date for PT Re-Evaluation 10/04/20    Authorization Type 5/10    PT Start Time 0930    PT Stop Time 1014    PT Time Calculation (min) 44 min    Equipment Utilized During Treatment Gait belt    Activity Tolerance Patient tolerated treatment well;Patient limited by fatigue    Behavior During Therapy WFL for tasks assessed/performed           Past Medical History:  Diagnosis Date  . AAA (abdominal aortic aneurysm) ([place]) 05/2019   [place] Rapid progression from June-Aug 2020 (4.6 - 5.4 cm): s/p EVAR (Dr. Lucky Cowboy [facility]): EVAR - 23 mm prox, 12 cm distal & x 18 cm length Gore Excluder Endoprosthesis Main body (via RFA distal to lowest Renal A); 65m x 14 cm L Contralateral Limb for L Iliac - extended with 8 mm x 6cm LifeStar stent (post-dilated with 7 mm DEB).  Additional R Iliac PTA not required.   . 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 ([place] 09/15/2017   09-15-2017 DIAGNOSIS:  A. COLON POLYP X 2, ASCENDING; COLD SNARE:  - TUBULAR ADENOMA.  - NEGATIVE FOR HIGH-GRADE DYSPLASIA AND MALIGNANCY.  - NO ADDITIONAL TISSUE IDENTIFIED; FECAL MATERIAL PRESENT.   B. COLON POLYP X 2, DESCENDING; COLD SNARE:  - ADENOCARCINOMA ARISING IN A DYSPLASTIC SESSILE SERRATED ADENOMA, WITH  LYMPHOVASCULAR INVASION, SEE COMMENT.  - TUBULAR ADENOMA, 2 FRAGMENTS, NEGATIVE FO  . Arthritis   . Basal cell carcinoma of eyelid 01/03/2013   2019 - R side of Nose  . Benign neoplasm of ascending colon   . Benign neoplasm of descending colon   . Bilateral iliac artery stenosis ([place] 2007   Bilateral Iliac A stenting;  extension of EVAR limbs into both Iliacs -- additional stent placed in L Iliac.  . Cancer ([place]    Skin cancer  . Colon polyps   . Complication of anesthesia    severe confusion agitation requiring hospital admission x 4 days  . Compression fracture of fourth lumbar vertebra ([place] 04/2019   - s/p Kyphoplasty; T12, L3-5 ([facility]  . COPD (chronic obstructive pulmonary disease) (HCC)    Centrilobular Emphysema  . Coronary artery disease involving native coronary artery without angina pectoris 1998   - s/p CABG, occluded SVG-D1; patent LIMA-LAD, SVG-OM, SVG- RCA  . Dementia ([place]    memory loss - started noting late 2019  . Falls frequently    broke foot  . Fracture 2021   left foot  . GERD (gastroesophageal reflux disease)   . Hemorrhoid 02/10/2015  . History of hiatal hernia   . History of kidney stones   . HOH (hard of hearing)   . Hypertension   . Lewy body dementia ([place] 04/04/2020  . Macular degeneration disease   . Osteoporosis   . PAF (paroxysmal atrial fibrillation) ([place]    on Eliquis for OAC - Rate Control   . Prostate cancer ([place]   . Prostate disorder   . Sciatic pain    Chronic  . Sleep apnea    cpap  .  Temporary cerebral vascular dysfunction 02/10/2015   Had negative work-up.  July, 2012.  No medication changes, had forgot them that day, got dehydrated.   . Urinary incontinence     Past Surgical History:  Procedure Laterality Date  . ABDOMINAL AORTIC ENDOVASCULAR STENT GRAFT Bilateral 05/18/2019   Procedure: ABDOMINAL AORTIC ENDOVASCULAR STENT GRAFT;  Surgeon: Algernon Huxley, MD;  Location: [facility] ORS;  Service: Vascular;  Laterality: Bilateral;  . ANKLE SURGERY Left    ORIF   . Arch aortogram and carotid aortogram  06/02/2006   Dr Oneida Alar did surgery  . BASAL CELL CARCINOMA EXCISION  04/2016   Dermatology  . CARDIAC CATHETERIZATION  01/30/1998    (Dr. Linard Millers): Native LAD & mRCA CTO. 90% D1. Mod Cx. SVG-D1 CTO. SVG-RCA & SVG-OM along with LIMA-LAD patent;  LV  NORMAL Fxn  . CAROTID ENDARTERECTOMY Left Oct. 29, 2007   Dr. Oneida Alar:  . CATARACT EXTRACTION W/PHACO Right 03/05/2017   Procedure: CATARACT EXTRACTION PHACO AND INTRAOCULAR LENS PLACEMENT (IOC);  Surgeon: Leandrew Koyanagi, MD;  Location: [facility] ORS;  Service: Ophthalmology;  Laterality: Right;  [place] 00:58.5 AP% 10.6 CDE 6.20 Fluid Pack lot # [id number]  . CATARACT EXTRACTION W/PHACO Left 04/15/2017   Procedure: CATARACT EXTRACTION PHACO AND INTRAOCULAR LENS PLACEMENT ([place])  Left;  Surgeon: Leandrew Koyanagi, MD;  Location: [place];  Service: Ophthalmology;  Laterality: Left;  . COLONOSCOPY WITH PROPOFOL N/A 09/15/2017   Procedure: COLONOSCOPY WITH PROPOFOL;  Surgeon: Lucilla Lame, MD;  [facility] END[facility];  Service: Endoscopy;  Laterality: N/A;  . COLONOSCOPY WITH PROPOFOL N/A 10/26/2018   Procedure: COLONOSCOPY WITH PROPOFOL;  Surgeon: Lucilla Lame, MD;  Location[facility] ENDOSCOPY;  Service: Endoscopy;  Laterality: N/A;  . CORONARY ARTERY BYPASS GRAFT  1998   LIMA-LAD, SVG-OM, SVG-RPDA, SVG-DIAG  . CPET / MET  12/2012   Mild chronotropic incompetence - read 82% of predicted; also reduced effort; peak VO2 15.7 / 75% (did not reach Max effort) -- suggested ischemic response in last 1.5 minutes of exercise. Normal pulmonary function on PFTs but poor response to  . CYSTOSCOPY W/ RETROGRADES Left 01/13/2020   Procedure: CYSTOSCOPY WITH RETROGRADE PYELOGRAM;  Surgeon: Billey Co, MD;  Location: [facility] ORS;  Service: Urology;  Laterality: Left;  . CYSTOSCOPY WITH STENT PLACEMENT Left 12/26/2019   Procedure: CYSTOSCOPY WITH STENT PLACEMENT;  Surgeon: Billey Co, MD;  Location: [facility] ORS;  Service: Urology;  Laterality: Left;  . CYSTOSCOPY/URETEROSCOPY/HOLMIUM LASER/STENT PLACEMENT Left 01/13/2020   Procedure: CYSTOSCOPY/URETEROSCOPY/HOLMIUM LASER/STENT EXCHANGE;  Surgeon: Billey Co, MD;  Location: [facility] ORS;  Service: Urology;  Laterality: Left;  . ENDOVASCULAR REPAIR/STENT  GRAFT N/A 05/18/2019   Procedure: ENDOVASCULAR REPAIR/STENT GRAFT;  Surgeon: Algernon Huxley, MD;  Location: [facility] INVASIVE CV LAB;; EVAR - 23 mm prox, 12 cm distal & x 18 cm length Gore Excluder Endoprosthesis Main body (via RFA distal to lowest Renal A); 91m x 14 cm L Contralateral Limb for L Iliac - extended with 8 mm x 6cm LifeStar stent; no additional R Iliac PTA needed.   . ESOPHAGOGASTRODUODENOSCOPY (EGD) WITH PROPOFOL N/A 09/15/2017   Procedure: ESOPHAGOGASTRODUODENOSCOPY (EGD) WITH PROPOFOL;  Surgeon: WLucilla Lame MD;  [facility];  Service: Endoscopy;  Laterality: N/A;  . FRACTURE SURGERY Right 03/19/2015   wrist  . FRACTURE SURGERY Left   . HIATAL HERNIA REPAIR    . ILIAC ARTERY STENT  12/15/2005   PTA and direct stenting rgt and lft common iliac arteries  . KYPHOPLASTY N/A 04/05/2019  Procedure: KYPHOPLASTY T12, L3, L4 L5;  Surgeon: Hessie Knows, MD;  Location: [facility] ORS;  Service: Orthopedics;  Laterality: N/A;  . NM GATED MYOVIEW ([place] HX)  05/'19; 8/'20   a) [facility]: Low Risk - no ischemia or infarct.  EF > 65%. no RWMA;; b) [facility]): Normal wall motion.  No ischemia or infarction.  . OPEN REDUCTION INTERNAL FIXATION (ORIF) DISTAL RADIAL FRACTURE Left 01/13/2019   Procedure: OPEN REDUCTION INTERNAL FIXATION (ORIF) DISTAL  RADIAL FRACTURE - LEFT - SLEEP APNEA;  Surgeon: Hessie Knows, MD;  Location: [facility] ORS;  Service: Orthopedics;  Laterality: Left;  . ORIF ELBOW FRACTURE Right 01/01/2017   Procedure: OPEN REDUCTION INTERNAL FIXATION (ORIF) ELBOW/OLECRANON FRACTURE;  Surgeon: Hessie Knows, MD;  Location: [facility] ORS;  Service: Orthopedics;  Laterality: Right;  . ORIF WRIST FRACTURE Right 03/19/2015   Procedure: OPEN REDUCTION INTERNAL FIXATION (ORIF) WRIST FRACTURE;  Surgeon: Hessie Knows, MD;  Location: [facility] ORS;  Service: Orthopedics;  Laterality: Right;  . SHOULDER ARTHROSCOPY Right   . THORACIC AORTA - CAROTID ANGIOGRAM  October 2007   Dr. 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 TRANSTHORACIC ECHOCARDIOGRAM  February 2016   [facility]: Normal LV function. Dilated left atrium.  Domingo Dimes ECHOCARDIOGRAM  04/2019   [facility] April 20, 2019): EF 55-60 %.  Mild LVH.  Relatively normal valves.    There were no vitals filed for this visit.   Subjective Assessment - 09/11/20 1300    Subjective Patient is accompanied with aide, reports no falls or LOB since last session. Has been compliant with HEP.    Pertinent History Patient is returning to PT for evaluation of balance. Patient has been seen by this therapist in the past. PMH AAA, arthritis, basal cell carcinoma of eyelid, benign neoplasm of ascending/descending colon, bilateral iliac artery stenosis, colon polyps, compression fx of 4th lumbar vertebra (s/p kyphoplasty T12, L3-5), COPD, CAD, dementia, falls,  GERD, hemorroids, HOH, HTN, Lewy Body Dementia, macular degeneration disease, osteoporosis, PAF, prostate cancer, prostate disorder, sciatic pain, sleep apnea, temporary cerebral vascular dysfunction. Comes with aide, having multiple near falls per day per aide, mainly to the sides.    Limitations Walking;Standing;House hold activities;Other (comment);Lifting;Sitting    How long can you sit comfortably? n/a back pain no longer limiting    How long can you stand comfortably? 10-12 with walker    How long can you walk comfortably? walks 15 minutes with a walker    Diagnostic tests MRI from [place] dated 03/28/2019 report was reviewed today. Acute compression fractures are noted at T12, L3, and L4. He has facet arthropathy at the lower lumbar levels. At L3-4 he has moderate central stenosis.    Patient Stated Goals walk without stumbling, be able to be steady enough to not have to have someone holding his gait belt.    Currently in Pain? No/denies             Blood pressure: 120/60        TherEx: Lateral squat walks 4x lengths of // bars; cues for depth of modified squat, excessive trunk flexion with repetition   STS throw basketball at aide ; requires SUE to stand up and sit down 10x; very challenging for patient.   12" step toe taps BUE support 10x each LE   Seated: RTB adduction 10x each LE RTB hamstring curl 10x each LE  Neuro Re-ed:  airex pad: -weighted  ball (2000 gr) chest press 10, straight arm raise 10x  -throw rainbow ball back and forth with aide x 18 tosses; no LOB  -6" step modified tandem stance 40 second holds, more challenging with LLE posteriorly positioned.  -step over orange hurdle and back 12x each LE   3 cone tap; BUE support, very challenging for spatial awareness and sequencing  Soccer ball kicks in // bars, BUE support 12x each LE, with aide assisting in kicking ball    Half foam roller df/pf 15x with BUE support   Standing cross body punches to aides mitts for stabilization x 83mnutes no LOB.     Pt educated throughout session about proper posture and technique with exercises. Improved exercise technique, movement at target joints, use of target muscles after min to mod verbal, visual, tactile cues.                        PT Education - 09/11/20 1300    Education provided Yes    Education Details exercise technique, body mechanics    Person(s) Educated Patient    Methods Explanation;Demonstration;Tactile cues;Verbal cues    Comprehension Verbalized understanding;Returned demonstration;Verbal cues required;Tactile cues required            PT Short Term Goals - 08/21/20 0959      PT SHORT TERM GOAL #1   Title Patient will be independent in home exercise program to improve strength/mobility for better functional independence with ADLs.    Baseline 11/11: HEP given 12/21: HEP compliant    Time 4    Period Weeks    Status Achieved    Target Date 08/09/20             PT Long Term Goals - 08/21/20  0958      PT LONG TERM GOAL #1   Title Patient will increase FOTO score to equal to or greater than 51% to demonstrate statistically significant improvement in mobility and quality of life.    Baseline 11/11: 42% 12/21: 46.8%    Time 12    Period Weeks    Status Partially Met    Target Date 10/04/20      PT LONG TERM GOAL #2   Title Patient will demonstrate an improved Berg Balance Score of > 40/56 as to demonstrate improved balance with ADLs such as sitting/standing and transfer balance and reduced fall risk.    Baseline 11/11: 34/56 12/21: 37/56    Time 12    Period Weeks    Status Partially Met    Target Date 10/04/20      PT LONG TERM GOAL #3   Title Patient (> 638years old) will complete five times sit to stand test in < 15 seconds without UE support indicating an increased LE strength and improved balance    Baseline 11/11; 23.35 seconds with UE support 12/21: 27.85 with one near LOB    Time 12    Period Weeks    Status On-going    Target Date 10/04/20      PT LONG TERM GOAL #4   Title Patient will increase 10 meter walk test to >1.019m with LRAD  as to improve gait speed for better community ambulation and to reduce fall risk.    Baseline 11/11: 0.74 m/s without AD with one near LOB 12/21: 1.08 m/s    Time 12    Period Weeks    Status Achieved      PT LONG TERM  GOAL #5   Title Patient will increase BLE gross strength to 4+/5 as to improve functional strength for independent gait, increased standing tolerance and increased ADL ability.    Baseline 11/11: see note    Time 12    Period Weeks    Status Partially Met    Target Date 10/04/20                 Plan - 09/11/20 1303    Clinical Impression Statement Patient is able to perform familiar advanced interventions however is challenged with new medium level interventions due to cognition as well as visual abilities at this time. Utilization of sports techniques and familiar objects allowed for patient to  perform dual task interventions without LOB. Patient will benefit from skilled physical therapy to increase strength, balance, and mobility to allow patient to reduce fall risk and increase independence in daily life.    Personal Factors and Comorbidities Age;Comorbidity 3+;Fitness;Past/Current Experience;Sex;Social Background;Time since onset of injury/illness/exacerbation;Transportation    Comorbidities AAA, arthritis, atherosclerotic heart disease, (s/p CABG), benign neoplasm of ascending/descending colon, COPD, CAD, dementia, dysrhythmia, frequent falls, GERD, hemorrhoid, HOH, HTN, PAF, prostate cancer.    Examination-Activity Limitations Bathing;Bend;Carry;Dressing;Continence;Stairs;Squat;Reach Overhead;Locomotion Level;Lift;Stand;Transfers;Toileting;Bed Mobility    Examination-Participation Restrictions Church;Cleaning;Community Activity;Driving;Interpersonal Relationship;Laundry;Volunteer;Shop;Personal Finances;Meal Prep;Yard Work    Product manager    Rehab Potential Fair    Clinical Impairments Affecting Rehab Potential (+) good support system, high prior level of function (-) currently undergoing radiation therapy for prostate cancer, memory deficits     PT Frequency 2x / week    PT Duration 12 weeks    PT Treatment/Interventions ADLs/Self Care Home Management;Aquatic Therapy;Cryotherapy;Moist Heat;Traction;Ultrasound;Therapeutic activities;Functional mobility training;Stair training;Gait training;DME Instruction;Therapeutic exercise;Balance training;Neuromuscular re-education;Patient/family education;Manual techniques;Passive range of motion;Energy conservation;Vestibular;Taping;Biofeedback;Electrical Stimulation;Iontophoresis 58m/ml Dexamethasone;Dry needling;Visual/perceptual remediation/compensation;Cognitive remediation;Canalith Repostioning    PT Next Visit Plan balance, strength    PT Home Exercise Plan see above    Consulted and Agree  with Plan of Care Patient;Family member/caregiver    Family Member Consulted aide           Patient will benefit from skilled therapeutic intervention in order to improve the following deficits and impairments:  Abnormal gait,Cardiopulmonary status limiting activity,Decreased activity tolerance,Decreased balance,Decreased knowledge of precautions,Decreased endurance,Decreased coordination,Decreased cognition,Decreased knowledge of use of DME,Decreased mobility,Difficulty walking,Decreased safety awareness,Decreased strength,Impaired flexibility,Impaired perceived functional ability,Impaired sensation,Postural dysfunction,Improper body mechanics,Pain,Impaired vision/preception,Decreased range of motion  Visit Diagnosis: Unsteadiness on feet  Muscle weakness (generalized)  Other abnormalities of gait and mobility  Chronic low back pain, unspecified back pain laterality, unspecified whether sciatica present     Problem List Patient Active Problem List   Diagnosis Date Noted  . AAA (abdominal aortic aneurysm) without rupture ([place] 06/29/2020  . LBD (Lewy body dementia) ([place] 05/14/2020  . Numbness and tingling in left hand 03/02/2020  . Hematuria   . Acute kidney injury superimposed on CKD (HH[place]   . Sepsis due to gram-negative UTI ([place] 12/26/2019  . Left nephrolithiasis 12/26/2019  . Hydronephrosis, left 12/26/2019  . Status post abdominal aortic aneurysm (AAA) repair 04/15/2019  . Do not intubate, cardiopulmonary resuscitation (CPR)-only code status 10/08/2018  . Prostate cancer ([place] 02/25/2018  . Prostatic cyst 01/15/2018  . Blood in stool   . Abnormal feces   . Stricture and stenosis of esophagus   . Mild cognitive impairment 08/18/2017  . Senile purpura ([place] 04/29/2017  . Anemia 04/29/2017  . Frequent falls 04/29/2017  . Simple chronic bronchitis ([place] 12/17/2016  . Benign localized hyperplasia of prostate  with urinary obstruction 07/15/2016  . Elevated PSA 07/15/2016   . Incomplete emptying of bladder 07/15/2016  . Urge incontinence 07/15/2016  . Hypothyroidism 04/25/2016  . Macular degeneration 04/25/2016  . Erectile dysfunction due to arterial insufficiency   . Ischemic heart disease with chronotropic incompetence   . CN (constipation) 02/10/2015  . DD (diverticular disease) 02/10/2015  . Weakness 02/10/2015  . Lichen planus 41/63/8453  . Restless leg 02/10/2015  . 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 B12 deficiency 02/10/2015  . COPD (chronic obstructive pulmonary disease) ([place]) 11/14/2014  . Post Inflammatory Lung Changes 11/14/2014  . PAF (paroxysmal atrial fibrillation) (HCC) CHA2DS2-VASc = 4. AC = Eliquis   . Insomnia 12/09/2013  . OSA on CPAP   . Dyspnea on exertion - -essentially resolved with BB dose reduction & wgt loss 03/29/2013  . Overweight (BMI 25.0-29.9) -- Wgt back up 01/17/2013  . Hx of CABG   . Essential hypertension   . Hyperlipidemia with target LDL less than 70   . Aorto-iliac disease ([place])   . BCC (basal cell carcinoma), eyelid 01/03/2013  . Allergic rhinitis 08/17/2009  . Adaptation reaction 05/28/2009  . Acid reflux 05/28/2009  . Arthritis, degenerative 05/28/2009  . 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 Carotid artery occlusion without infarction 06/01/2006  . PAD (peripheral artery disease) - bilateral common iliac stents 12/15/2005  . Atherosclerotic heart disease of artery bypass graft 09/01/1997   Janna Arch, PT, DPT   09/11/2020, 1:04 PM  [place] MAIN [facility] SERVICES [address] [place], [place], [address] Phone: [phone]   Fax:  [phone]  Name: EATHEN BUDREAU MRN: [id number] Date of Birth: Aug 29, 1934

## 2020-09-13 ENCOUNTER — Ambulatory Visit: Payer: Medicare Other

## 2020-09-13 ENCOUNTER — Other Ambulatory Visit: Payer: Self-pay

## 2020-09-13 DIAGNOSIS — R2689 Other abnormalities of gait and mobility: Secondary | ICD-10-CM

## 2020-09-13 DIAGNOSIS — M6281 Muscle weakness (generalized): Secondary | ICD-10-CM

## 2020-09-13 DIAGNOSIS — R2681 Unsteadiness on feet: Secondary | ICD-10-CM

## 2020-09-13 NOTE — Therapy (Signed)
[facility] MAIN [facility] SERVICES [address] [place], [place], [address] Phone: [phone]   Fax:  [phone]  Physical Therapy Treatment  Patient Details  Name: [name] MRN: [id number] Date of Birth: May 28, 1934 Referring Provider (PT): Cristopher Peru K    Encounter Date: 09/13/2020   PT End of Session - 09/13/20 1046    Visit Number 16    Number of Visits 24    Date for PT Re-Evaluation 10/04/20    Authorization Type 5/10    PT Start Time 0930    PT Stop Time 1016    PT Time Calculation (min) 46 min    Equipment Utilized During Treatment Gait belt    Activity Tolerance Patient tolerated treatment well    Behavior During Therapy WFL for tasks assessed/performed           Past Medical History:  Diagnosis Date  . AAA (abdominal aortic aneurysm) (HCC) 05/2019   Relativley Rapid progression from June-Aug 2020 (4.6 - 5.4 cm): s/p EVAR (Dr. Wyn Quaker [facility]): EVAR - 23 mm prox, 12 cm distal & x 18 cm length Gore Excluder Endoprosthesis Main body (via RFA distal to lowest Renal A); 50mm x 14 cm L Contralateral Limb for L Iliac - extended with 8 mm x 6cm LifeStar stent (post-dilated with 7 mm DEB).  Additional R Iliac PTA not required.   . 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 (HCC) 09/15/2017   09-15-2017 DIAGNOSIS:  A. COLON POLYP X 2, ASCENDING; COLD SNARE:  - TUBULAR ADENOMA.  - NEGATIVE FOR HIGH-GRADE DYSPLASIA AND MALIGNANCY.  - NO ADDITIONAL TISSUE IDENTIFIED; FECAL MATERIAL PRESENT.   B. COLON POLYP X 2, DESCENDING; COLD SNARE:  - ADENOCARCINOMA ARISING IN A DYSPLASTIC SESSILE SERRATED ADENOMA, WITH  LYMPHOVASCULAR INVASION, SEE COMMENT.  - TUBULAR ADENOMA, 2 FRAGMENTS, NEGATIVE FO  . Arthritis   . Basal cell carcinoma of eyelid 01/03/2013   2019 - R side of Nose  . Benign neoplasm of ascending colon   . Benign neoplasm of descending colon   . Bilateral iliac artery stenosis (HCC) 2007   Bilateral Iliac A stenting; extension of EVAR limbs  into both Iliacs -- additional stent placed in L Iliac.  . Cancer (HCC)    Skin cancer  . Colon polyps   . Complication of anesthesia    severe confusion agitation requiring hospital admission x 4 days  . Compression fracture of fourth lumbar vertebra (HCC) 04/2019   - s/p Kyphoplasty; T12, L3-5 [facility])  . COPD (chronic obstructive pulmonary disease) (HCC)    Centrilobular Emphysema  . Coronary artery disease involving native coronary artery without angina pectoris 1998   - s/p CABG, occluded SVG-D1; patent LIMA-LAD, SVG-OM, SVG- RCA  . Dementia (HCC)    memory loss - started noting late 2019  . Falls frequently    broke foot  . Fracture 2021   left foot  . GERD (gastroesophageal reflux disease)   . Hemorrhoid 02/10/2015  . History of hiatal hernia   . History of kidney stones   . HOH (hard of hearing)   . Hypertension   . Lewy body dementia (HCC) 04/04/2020  . Macular degeneration disease   . Osteoporosis   . PAF (paroxysmal atrial fibrillation) (HCC)    on Eliquis for OAC - Rate Control   . Prostate cancer (HCC)   . Prostate disorder   . Sciatic pain    Chronic  . Sleep apnea    cpap  . Temporary cerebral  vascular dysfunction 02/10/2015   Had negative work-up.  July, 2012.  No medication changes, had forgot them that day, got dehydrated.   . Urinary incontinence     Past Surgical History:  Procedure Laterality Date  . ABDOMINAL AORTIC ENDOVASCULAR STENT GRAFT Bilateral 05/18/2019   Procedure: ABDOMINAL AORTIC ENDOVASCULAR STENT GRAFT;  Surgeon: Algernon Huxley, MD;  Location: [facility] ORS;  Service: Vascular;  Laterality: Bilateral;  . ANKLE SURGERY Left    ORIF   . Arch aortogram and carotid aortogram  06/02/2006   Dr Oneida Alar did surgery  . BASAL CELL CARCINOMA EXCISION  04/2016   Dermatology  . CARDIAC CATHETERIZATION  01/30/1998    (Dr. Linard Millers): Native LAD & mRCA CTO. 90% D1. Mod Cx. SVG-D1 CTO. SVG-RCA & SVG-OM along with LIMA-LAD patent;  LV NORMAL Fxn  . CAROTID  ENDARTERECTOMY Left Oct. 29, 2007   Dr. Oneida Alar:  . CATARACT EXTRACTION W/PHACO Right 03/05/2017   Procedure: CATARACT EXTRACTION PHACO AND INTRAOCULAR LENS PLACEMENT (IOC);  Surgeon: Leandrew Koyanagi, MD;  Location: [facility] ORS;  Service: Ophthalmology;  Laterality: Right;  [place] 00:58.5 AP% 10.6 CDE 6.20 Fluid Pack lot # [id number]  . CATARACT EXTRACTION W/PHACO Left 04/15/2017   Procedure: CATARACT EXTRACTION PHACO AND INTRAOCULAR LENS PLACEMENT ([place])  Left;  Surgeon: Leandrew Koyanagi, MD;  Location: [place];  Service: Ophthalmology;  Laterality: Left;  . COLONOSCOPY WITH PROPOFOL N/A 09/15/2017   Procedure: COLONOSCOPY WITH PROPOFOL;  Surgeon: Lucilla Lame, MD;  Location: [facility] ENDOSCOPY;  Service: Endoscopy;  Laterality: N/A;  . COLONOSCOPY WITH PROPOFOL N/A 10/26/2018   Procedure: COLONOSCOPY WITH PROPOFOL;  Surgeon: Lucilla Lame, MD;  Location: [facility] ENDOSCOPY;  Service: Endoscopy;  Laterality: N/A;  . CORONARY ARTERY BYPASS GRAFT  1998   LIMA-LAD, SVG-OM, SVG-RPDA, SVG-DIAG  . CPET / MET  12/2012   Mild chronotropic incompetence - read 82% of predicted; also reduced effort; peak VO2 15.7 / 75% (did not reach Max effort) -- suggested ischemic response in last 1.5 minutes of exercise. Normal pulmonary function on PFTs but poor response to  . CYSTOSCOPY W/ RETROGRADES Left 01/13/2020   Procedure: CYSTOSCOPY WITH RETROGRADE PYELOGRAM;  Surgeon: Billey Co, MD;  Location: [facility] ORS;  Service: Urology;  Laterality: Left;  . CYSTOSCOPY WITH STENT PLACEMENT Left 12/26/2019   Procedure: CYSTOSCOPY WITH STENT PLACEMENT;  Surgeon: Billey Co, MD;  Location: [facility] ORS;  Service: Urology;  Laterality: Left;  . CYSTOSCOPY/URETEROSCOPY/HOLMIUM LASER/STENT PLACEMENT Left 01/13/2020   Procedure: CYSTOSCOPY/URETEROSCOPY/HOLMIUM LASER/STENT EXCHANGE;  Surgeon: Billey Co, MD;  Location: [facility] ORS;  Service: Urology;  Laterality: Left;  . ENDOVASCULAR REPAIR/STENT GRAFT N/A 05/18/2019    Procedure: ENDOVASCULAR REPAIR/STENT GRAFT;  Surgeon: Algernon Huxley, MD;  Location: [facility] INVASIVE CV LAB;; EVAR - 23 mm prox, 12 cm distal & x 18 cm length Gore Excluder Endoprosthesis Main body (via RFA distal to lowest Renal A); 67m x 14 cm L Contralateral Limb for L Iliac - extended with 8 mm x 6cm LifeStar stent; no additional R Iliac PTA needed.   . ESOPHAGOGASTRODUODENOSCOPY (EGD) WITH PROPOFOL N/A 09/15/2017   Procedure: ESOPHAGOGASTRODUODENOSCOPY (EGD) WITH PROPOFOL;  Surgeon: WLucilla Lame MD;  [facility];  Service: Endoscopy;  Laterality: N/A;  . FRACTURE SURGERY Right 03/19/2015   wrist  . FRACTURE SURGERY Left   . HIATAL HERNIA REPAIR    . ILIAC ARTERY STENT  12/15/2005   PTA and direct stenting rgt and lft common iliac arteries  . KYPHOPLASTY N/A 04/05/2019   Procedure:  KYPHOPLASTY T12, L3, L4 L5;  Surgeon: Hessie Knows, MD;  Location: [facility] ORS;  Service: Orthopedics;  Laterality: N/A;  . NM GATED MYOVIEW ([place] [facility])  05/'19; 8/'20   a) [facility]: Low Risk - no ischemia or infarct.  EF > 65%. no RWMA;; b) [facility]): Normal wall motion.  No ischemia or infarction.  . OPEN REDUCTION INTERNAL FIXATION (ORIF) DISTAL RADIAL FRACTURE Left 01/13/2019   Procedure: OPEN REDUCTION INTERNAL FIXATION (ORIF) DISTAL  RADIAL FRACTURE - LEFT - SLEEP APNEA;  Surgeon: Hessie Knows, MD;  Location: [facility] ORS;  Service: Orthopedics;  Laterality: Left;  . ORIF ELBOW FRACTURE Right 01/01/2017   Procedure: OPEN REDUCTION INTERNAL FIXATION (ORIF) ELBOW/OLECRANON FRACTURE;  Surgeon: Hessie Knows, MD;  Location: [facility] ORS;  Service: Orthopedics;  Laterality: Right;  . ORIF WRIST FRACTURE Right 03/19/2015   Procedure: OPEN REDUCTION INTERNAL FIXATION (ORIF) WRIST FRACTURE;  Surgeon: Hessie Knows, MD;  Location: [facility] ORS;  Service: Orthopedics;  Laterality: Right;  . SHOULDER ARTHROSCOPY Right   . THORACIC AORTA - CAROTID ANGIOGRAM  October 2007   Dr. 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 TRANSTHORACIC ECHOCARDIOGRAM  February 2016   [facility]: Normal LV function. Dilated left atrium.  Domingo Dimes ECHOCARDIOGRAM  04/2019   [facility] April 20, 2019): EF 55-60 %.  Mild LVH.  Relatively normal valves.    There were no vitals filed for this visit.   Subjective Assessment - 09/13/20 1041    Subjective Pt reports he has been doing HEP and is doing well. Pt reports he has new device he wears to assist caregiver in helping him get up from floor in case he were to have a fall.    Pertinent History Patient is returning to PT for evaluation of balance. Patient has been seen by this therapist in the past. PMH AAA, arthritis, basal cell carcinoma of eyelid, benign neoplasm of ascending/descending colon, bilateral iliac artery stenosis, colon polyps, compression fx of 4th lumbar vertebra (s/p kyphoplasty T12, L3-5), COPD, CAD, dementia, falls,  GERD, hemorroids, HOH, HTN, Lewy Body Dementia, macular degeneration disease, osteoporosis, PAF, prostate cancer, prostate disorder, sciatic pain, sleep apnea, temporary cerebral vascular dysfunction. Comes with aide, having multiple near falls per day per aide, mainly to the sides.    Limitations Walking;Standing;House hold activities;Other (comment);Lifting;Sitting    How long can you sit comfortably? n/a back pain no longer limiting    How long can you stand comfortably? 10-12 with walker    How long can you walk comfortably? walks 15 minutes with a walker    Diagnostic tests MRI from [place] dated 03/28/2019 report was reviewed today. Acute compression fractures are noted at T12, L3, and L4. He has facet arthropathy at the lower lumbar levels. At L3-4 he has moderate central stenosis.    Patient Stated Goals walk without stumbling, be able to be steady enough to not have to have someone holding his gait belt.    Currently in Pain? No/denies            Blood pressure: 139/79   TherEx: Lateral squat walks 4x lengths of // bars; cues for depth of squat, foot placement/technique. B UE support  Walking lunges in // bars 3x - cues for depth and foot placement.  B UE support.  STS- 3x10,  with use of UE assist   Emphasis on eccentric control and hand placement/reaching back for chair   Seated:  RTB hamstring curls 3x12 B LEs    Neuro Re-ed:     [place]one taps in // bars from 3x10 B LEs, forward and across with BUE to unilateral UE support, supervision-CGA   Soccer ball kicks in // bars, BUE support x multiple repetitions each LE, with aide assisting in kicking ball. Pt went from B to unilateral UE support  Sit<>stands with feet on compliant surface -1x6 BUE assist and CGA-min assist, cuing for eccentric control and upright posture.        PT Short Term Goals - 08/21/20 0959      PT SHORT TERM GOAL #1   Title Patient will be independent in home exercise program to improve strength/mobility for better functional independence with ADLs.    Baseline 11/11: HEP given 12/21: HEP compliant    Time 4    Period Weeks    Status Achieved    Target Date 08/09/20             PT Long Term Goals - 08/21/20 0958      PT LONG TERM GOAL #1   Title Patient will increase FOTO score to equal to or greater than 51% to demonstrate statistically significant improvement in mobility and quality of life.    Baseline 11/11: 42% 12/21: 46.8%    Time 12    Period Weeks    Status Partially Met    Target Date 10/04/20      PT LONG TERM GOAL #2   Title Patient will demonstrate an improved Berg Balance Score of > 40/56 as to demonstrate improved balance with ADLs such as sitting/standing and transfer balance and reduced fall risk.    Baseline 11/11: 34/56 12/21: 37/56    Time 12    Period Weeks    Status Partially Met    Target Date 10/04/20      PT LONG TERM GOAL #3   Title Patient (> 60 years old) will complete five times sit to stand test  in < 15 seconds without UE support indicating an increased LE strength and improved balance    Baseline 11/11; 23.35 seconds with UE support 12/21: 27.85 with one near LOB    Time 12    Period Weeks    Status On-going    Target Date 10/04/20      PT LONG TERM GOAL #4   Title Patient will increase 10 meter walk test to >1.69m/s with LRAD  as to improve gait speed for better community ambulation and to reduce fall risk.    Baseline 11/11: 0.74 m/s without AD with one near LOB 12/21: 1.08 m/s    Time 12    Period Weeks    Status Achieved      PT LONG TERM GOAL #5   Title Patient will increase BLE gross strength to 4+/5 as to improve functional strength for independent gait, increased standing tolerance and increased ADL ability.    Baseline 11/11: see note    Time 12    Period Weeks    Status Partially Met    Target Date 10/04/20                 Plan - 09/13/20 1037    Clinical Impression Statement Pt requires frequent cuing throughout session for proper posture and technique with all exercises. Pt did demonstrate within-session ability to self-correct technique with STS with reps. Pt greatest difficulty with sit>stand from compliant surface requiring up to min assist from PT to maintain balance. Pt was  able to advance number of reps performed on majority of exercises and decrease UE support. Pt will continue to benefit from further skilled therapy to improve B LE strength and balance in order to decrease fall risk and improve QOL.    Personal Factors and Comorbidities Age;Comorbidity 3+;Fitness;Past/Current Experience;Sex;Social Background;Time since onset of injury/illness/exacerbation;Transportation    Comorbidities AAA, arthritis, atherosclerotic heart disease, (s/p CABG), benign neoplasm of ascending/descending colon, COPD, CAD, dementia, dysrhythmia, frequent falls, GERD, hemorrhoid, HOH, HTN, PAF, prostate cancer.    Examination-Activity Limitations  Bathing;Bend;Carry;Dressing;Continence;Stairs;Squat;Reach Overhead;Locomotion Level;Lift;Stand;Transfers;Toileting;Bed Mobility    Examination-Participation Restrictions Church;Cleaning;Community Activity;Driving;Interpersonal Relationship;Laundry;Volunteer;Shop;Personal Finances;Meal Prep;Yard Work    Product manager    Rehab Potential Fair    Clinical Impairments Affecting Rehab Potential (+) good support system, high prior level of function (-) currently undergoing radiation therapy for prostate cancer, memory deficits     PT Frequency 2x / week    PT Duration 12 weeks    PT Treatment/Interventions ADLs/Self Care Home Management;Aquatic Therapy;Cryotherapy;Moist Heat;Traction;Ultrasound;Therapeutic activities;Functional mobility training;Stair training;Gait training;DME Instruction;Therapeutic exercise;Balance training;Neuromuscular re-education;Patient/family education;Manual techniques;Passive range of motion;Energy conservation;Vestibular;Taping;Biofeedback;Electrical Stimulation;Iontophoresis 4mg /ml Dexamethasone;Dry needling;Visual/perceptual remediation/compensation;Cognitive remediation;Canalith Repostioning    PT Next Visit Plan balance, strength    PT Home Exercise Plan see above    Consulted and Agree with Plan of Care Patient;Family member/caregiver    Family Member Consulted aide           Patient will benefit from skilled therapeutic intervention in order to improve the following deficits and impairments:  Abnormal gait,Cardiopulmonary status limiting activity,Decreased activity tolerance,Decreased balance,Decreased knowledge of precautions,Decreased endurance,Decreased coordination,Decreased cognition,Decreased knowledge of use of DME,Decreased mobility,Difficulty walking,Decreased safety awareness,Decreased strength,Impaired flexibility,Impaired perceived functional ability,Impaired sensation,Postural dysfunction,Improper body  mechanics,Pain,Impaired vision/preception,Decreased range of motion  Visit Diagnosis: Unsteadiness on feet  Muscle weakness (generalized)  Other abnormalities of gait and mobility     Problem List Patient Active Problem List   Diagnosis Date Noted  . AAA (abdominal aortic aneurysm) without rupture ([place]) 06/29/2020  . LBD (Lewy body dementia) ([place]) 05/14/2020  . Numbness and tingling in left hand 03/02/2020  . Hematuria   . Acute kidney injury superimposed on CKD ([place])   . Sepsis due to gram-negative UTI ([place]) 12/26/2019  . Left nephrolithiasis 12/26/2019  . Hydronephrosis, left 12/26/2019  . Status post abdominal aortic aneurysm (AAA) repair 04/15/2019  . Do not intubate, cardiopulmonary resuscitation (CPR)-only code status 10/08/2018  . Prostate cancer ([place]) 02/25/2018  . Prostatic cyst 01/15/2018  . Blood in stool   . Abnormal feces   . Stricture and stenosis of esophagus   . Mild cognitive impairment 08/18/2017  . Senile purpura ([place]) 04/29/2017  . Anemia 04/29/2017  . Frequent falls 04/29/2017  . Simple chronic bronchitis ([place]) 12/17/2016  . Benign localized hyperplasia of prostate with urinary obstruction 07/15/2016  . Elevated PSA 07/15/2016  . Incomplete emptying of bladder 07/15/2016  . Urge incontinence 07/15/2016  . Hypothyroidism 04/25/2016  . Macular degeneration 04/25/2016  . Erectile dysfunction due to arterial insufficiency   . Ischemic heart disease with chronotropic incompetence   . CN (constipation) 02/10/2015  . DD (diverticular disease) 02/10/2015  . Weakness 02/10/2015  . Lichen planus 78/93/8101  . Restless leg 02/10/2015  . 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 B12 deficiency 02/10/2015  . COPD (chronic obstructive pulmonary disease) ([place]) 11/14/2014  . Post Inflammatory Lung Changes 11/14/2014  . PAF (paroxysmal atrial fibrillation) (HCC) CHA2DS2-VASc = 4. AC = Eliquis   . Insomnia 12/09/2013  . OSA on CPAP   .  Dyspnea on exertion -  -essentially resolved with BB dose reduction & wgt loss 03/29/2013  . Overweight (BMI 25.0-29.9) -- Wgt back up 01/17/2013  . Hx of CABG   . Essential hypertension   . Hyperlipidemia with target LDL less than 70   . Aorto-iliac disease ([place])   . BCC (basal cell carcinoma), eyelid 01/03/2013  . Allergic rhinitis 08/17/2009  . Adaptation reaction 05/28/2009  . Acid reflux 05/28/2009  . Arthritis, degenerative 05/28/2009  . 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 Carotid artery occlusion without infarction 06/01/2006  . PAD (peripheral artery disease) - bilateral common iliac stents 12/15/2005  . Atherosclerotic heart disease of artery bypass graft 09/01/1997    Ricard Dillon PT, DPT  09/13/2020, 10:53 AM  [place] MAIN [facility] SERVICES [address] [place], [place], [address] Phone: [phone]   Fax:  [phone]  Name: SHJON LIZARRAGA MRN: [id number] Date of Birth: [birth year]

## 2020-09-18 ENCOUNTER — Ambulatory Visit: Payer: TRICARE For Life (TFL)

## 2020-09-20 ENCOUNTER — Ambulatory Visit: Payer: Medicare Other

## 2020-09-20 DIAGNOSIS — M545 Low back pain, unspecified: Secondary | ICD-10-CM

## 2020-09-20 DIAGNOSIS — M6281 Muscle weakness (generalized): Secondary | ICD-10-CM

## 2020-09-20 DIAGNOSIS — R2681 Unsteadiness on feet: Secondary | ICD-10-CM

## 2020-09-20 DIAGNOSIS — R262 Difficulty in walking, not elsewhere classified: Secondary | ICD-10-CM

## 2020-09-20 DIAGNOSIS — R2689 Other abnormalities of gait and mobility: Secondary | ICD-10-CM

## 2020-09-20 DIAGNOSIS — G8929 Other chronic pain: Secondary | ICD-10-CM

## 2020-09-20 NOTE — Therapy (Signed)
[place] MAIN [facility] SERVICES [address] [place], [place], [address] Phone: [phone]   Fax:  [phone]  Physical Therapy Treatment  Patient Details  Name: [name] MRN: [id number] Date of Birth: [birth year] Referring Provider (PT): Jennings Books K    Encounter Date: 09/20/2020   PT End of Session - 09/20/20 1012    Visit Number 17    Number of Visits 24    Date for PT Re-Evaluation 10/04/20    Authorization Type 5/10    PT Start Time 0925    PT Stop Time 1010    PT Time Calculation (min) 45 min    Equipment Utilized During Treatment Gait belt    Activity Tolerance Patient tolerated treatment well    Behavior During Therapy WFL for tasks assessed/performed           Past Medical History:  Diagnosis Date  . AAA (abdominal aortic aneurysm) ([place]) 05/2019   Relativley Rapid progression from June-Aug 2020 (4.6 - 5.4 cm): s/p EVAR (Dr. Lucky Cowboy [facility]): EVAR - 23 mm prox, 12 cm distal & x 18 cm length Gore Excluder Endoprosthesis Main body (via RFA distal to lowest Renal A); 75mm x 14 cm L Contralateral Limb for L Iliac - extended with 8 mm x 6cm LifeStar stent (post-dilated with 7 mm DEB).  Additional R Iliac PTA not required.   . 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 ([place]) 09/15/2017   09-15-2017 DIAGNOSIS:  A. COLON POLYP X 2, ASCENDING; COLD SNARE:  - TUBULAR ADENOMA.  - NEGATIVE FOR HIGH-GRADE DYSPLASIA AND MALIGNANCY.  - NO ADDITIONAL TISSUE IDENTIFIED; FECAL MATERIAL PRESENT.   B. COLON POLYP X 2, DESCENDING; COLD SNARE:  - ADENOCARCINOMA ARISING IN A DYSPLASTIC SESSILE SERRATED ADENOMA, WITH  LYMPHOVASCULAR INVASION, SEE COMMENT.  - TUBULAR ADENOMA, 2 FRAGMENTS, NEGATIVE FO  . Arthritis   . Basal cell carcinoma of eyelid 01/03/2013   2019 - R side of Nose  . Benign neoplasm of ascending colon   . Benign neoplasm of descending colon   . Bilateral iliac artery stenosis ([place]) 2007   Bilateral Iliac A stenting; extension of EVAR limbs  into both Iliacs -- additional stent placed in L Iliac.  . Cancer ([place])    Skin cancer  . Colon polyps   . Complication of anesthesia    severe confusion agitation requiring hospital admission x 4 days  . Compression fracture of fourth lumbar vertebra ([place]) 04/2019   - s/p Kyphoplasty; T12, L3-5 [facility])  . COPD (chronic obstructive pulmonary disease) (HCC)    Centrilobular Emphysema  . Coronary artery disease involving native coronary artery without angina pectoris 1998   - s/p CABG, occluded SVG-D1; patent LIMA-LAD, SVG-OM, SVG- RCA  . Dementia ([place])    memory loss - started noting late 2019  . Falls frequently    broke foot  . Fracture 2021   left foot  . GERD (gastroesophageal reflux disease)   . Hemorrhoid 02/10/2015  . History of hiatal hernia   . History of kidney stones   . HOH (hard of hearing)   . Hypertension   . Lewy body dementia ([place]) 04/04/2020  . Macular degeneration disease   . Osteoporosis   . PAF (paroxysmal atrial fibrillation) ([place])    on Eliquis for OAC - Rate Control   . Prostate cancer ([place])   . Prostate disorder   . Sciatic pain    Chronic  . Sleep apnea    cpap  . Temporary cerebral  vascular dysfunction 02/10/2015   Had negative work-up.  July, 2012.  No medication changes, had forgot them that day, got dehydrated.   . Urinary incontinence     Past Surgical History:  Procedure Laterality Date  . ABDOMINAL AORTIC ENDOVASCULAR STENT GRAFT Bilateral 05/18/2019   Procedure: ABDOMINAL AORTIC ENDOVASCULAR STENT GRAFT;  Surgeon: Algernon Huxley, MD;  Location: [facility] ORS;  Service: Vascular;  Laterality: Bilateral;  . ANKLE SURGERY Left    ORIF   . Arch aortogram and carotid aortogram  06/02/2006   Dr Oneida Alar did surgery  . BASAL CELL CARCINOMA EXCISION  04/2016   Dermatology  . CARDIAC CATHETERIZATION  01/30/1998    (Dr. Linard Millers): Native LAD & mRCA CTO. 90% D1. Mod Cx. SVG-D1 CTO. SVG-RCA & SVG-OM along with LIMA-LAD patent;  LV NORMAL Fxn  . CAROTID  ENDARTERECTOMY Left Oct. 29, 2007   Dr. Oneida Alar:  . CATARACT EXTRACTION W/PHACO Right 03/05/2017   Procedure: CATARACT EXTRACTION PHACO AND INTRAOCULAR LENS PLACEMENT (IOC);  Surgeon: Leandrew Koyanagi, MD;  Location: [facility] ORS;  Service: Ophthalmology;  Laterality: Right;  [place] 00:58.5 AP% 10.6 CDE 6.20 Fluid Pack lot # [id number]  . CATARACT EXTRACTION W/PHACO Left 04/15/2017   Procedure: CATARACT EXTRACTION PHACO AND INTRAOCULAR LENS PLACEMENT ([place])  Left;  Surgeon: Leandrew Koyanagi, MD;  Location: [place];  Service: Ophthalmology;  Laterality: Left;  . COLONOSCOPY WITH PROPOFOL N/A 09/15/2017   Procedure: COLONOSCOPY WITH PROPOFOL;  Surgeon: Lucilla Lame, MD;  Location: [facility] ENDOSCOPY;  Service: Endoscopy;  Laterality: N/A;  . COLONOSCOPY WITH PROPOFOL N/A 10/26/2018   Procedure: COLONOSCOPY WITH PROPOFOL;  Surgeon: Lucilla Lame, MD;  Location: [facility] ENDOSCOPY;  Service: Endoscopy;  Laterality: N/A;  . CORONARY ARTERY BYPASS GRAFT  1998   LIMA-LAD, SVG-OM, SVG-RPDA, SVG-DIAG  . CPET / MET  12/2012   Mild chronotropic incompetence - read 82% of predicted; also reduced effort; peak VO2 15.7 / 75% (did not reach Max effort) -- suggested ischemic response in last 1.5 minutes of exercise. Normal pulmonary function on PFTs but poor response to  . CYSTOSCOPY W/ RETROGRADES Left 01/13/2020   Procedure: CYSTOSCOPY WITH RETROGRADE PYELOGRAM;  Surgeon: Billey Co, MD;  Location: [facility] ORS;  Service: Urology;  Laterality: Left;  . CYSTOSCOPY WITH STENT PLACEMENT Left 12/26/2019   Procedure: CYSTOSCOPY WITH STENT PLACEMENT;  Surgeon: Billey Co, MD;  Location: [facility] ORS;  Service: Urology;  Laterality: Left;  . CYSTOSCOPY/URETEROSCOPY/HOLMIUM LASER/STENT PLACEMENT Left 01/13/2020   Procedure: CYSTOSCOPY/URETEROSCOPY/HOLMIUM LASER/STENT EXCHANGE;  Surgeon: Billey Co, MD;  Location: [facility] ORS;  Service: Urology;  Laterality: Left;  . ENDOVASCULAR REPAIR/STENT GRAFT N/A 05/18/2019    Procedure: ENDOVASCULAR REPAIR/STENT GRAFT;  Surgeon: Algernon Huxley, MD;  Location: [facility] INVASIVE CV LAB;; EVAR - 23 mm prox, 12 cm distal & x 18 cm length Gore Excluder Endoprosthesis Main body (via RFA distal to lowest Renal A); 67m x 14 cm L Contralateral Limb for L Iliac - extended with 8 mm x 6cm LifeStar stent; no additional R Iliac PTA needed.   . ESOPHAGOGASTRODUODENOSCOPY (EGD) WITH PROPOFOL N/A 09/15/2017   Procedure: ESOPHAGOGASTRODUODENOSCOPY (EGD) WITH PROPOFOL;  Surgeon: WLucilla Lame MD;  [facility];  Service: Endoscopy;  Laterality: N/A;  . FRACTURE SURGERY Right 03/19/2015   wrist  . FRACTURE SURGERY Left   . HIATAL HERNIA REPAIR    . ILIAC ARTERY STENT  12/15/2005   PTA and direct stenting rgt and lft common iliac arteries  . KYPHOPLASTY N/A 04/05/2019   Procedure:  KYPHOPLASTY T12, L3, L4 L5;  Surgeon: Hessie Knows, MD;  Location: [facility] ORS;  Service: Orthopedics;  Laterality: N/A;  . NM GATED MYOVIEW ([place] [facility])  05/'19; 8/'20   a) [facility]: Low Risk - no ischemia or infarct.  EF > 65%. no RWMA;; b) [facility]): Normal wall motion.  No ischemia or infarction.  . OPEN REDUCTION INTERNAL FIXATION (ORIF) DISTAL RADIAL FRACTURE Left 01/13/2019   Procedure: OPEN REDUCTION INTERNAL FIXATION (ORIF) DISTAL  RADIAL FRACTURE - LEFT - SLEEP APNEA;  Surgeon: Hessie Knows, MD;  Location: [facility] ORS;  Service: Orthopedics;  Laterality: Left;  . ORIF ELBOW FRACTURE Right 01/01/2017   Procedure: OPEN REDUCTION INTERNAL FIXATION (ORIF) ELBOW/OLECRANON FRACTURE;  Surgeon: Hessie Knows, MD;  Location: [facility] ORS;  Service: Orthopedics;  Laterality: Right;  . ORIF WRIST FRACTURE Right 03/19/2015   Procedure: OPEN REDUCTION INTERNAL FIXATION (ORIF) WRIST FRACTURE;  Surgeon: Hessie Knows, MD;  Location: [facility] ORS;  Service: Orthopedics;  Laterality: Right;  . SHOULDER ARTHROSCOPY Right   . THORACIC AORTA - CAROTID ANGIOGRAM  October 2007   Dr. 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 TRANSTHORACIC ECHOCARDIOGRAM  February 2016   [facility]: Normal LV function. Dilated left atrium.  Domingo Dimes ECHOCARDIOGRAM  04/2019   [facility] April 20, 2019): EF 55-60 %.  Mild LVH.  Relatively normal valves.    There were no vitals filed for this visit.   Subjective Assessment - 09/20/20 0929    Subjective Patient reports he is doing well, no falls or loss of balance.    Pertinent History Patient is returning to PT for evaluation of balance. Patient has been seen by this therapist in the past. PMH AAA, arthritis, basal cell carcinoma of eyelid, benign neoplasm of ascending/descending colon, bilateral iliac artery stenosis, colon polyps, compression fx of 4th lumbar vertebra (s/p kyphoplasty T12, L3-5), COPD, CAD, dementia, falls,  GERD, hemorroids, HOH, HTN, Lewy Body Dementia, macular degeneration disease, osteoporosis, PAF, prostate cancer, prostate disorder, sciatic pain, sleep apnea, temporary cerebral vascular dysfunction. Comes with aide, having multiple near falls per day per aide, mainly to the sides.    Limitations Walking;Standing;House hold activities;Other (comment);Lifting;Sitting    How long can you sit comfortably? n/a back pain no longer limiting    How long can you stand comfortably? 10-12 with walker    How long can you walk comfortably? walks 15 minutes with a walker    Diagnostic tests MRI from [place] dated 03/28/2019 report was reviewed today. Acute compression fractures are noted at T12, L3, and L4. He has facet arthropathy at the lower lumbar levels. At L3-4 he has moderate central stenosis.    Patient Stated Goals walk without stumbling, be able to be steady enough to not have to have someone holding his gait belt.    Currently in Pain? No/denies    Pain Score 0-No pain         pre-treat  Blood pressure: 177/75 HR 64  Post-treat  BP 145/66 HR 79     TherEx:  Fwd/Reto walk in parallel bars without UE support  Cueing for step length and posture Side stepping in parallel bars without UE support cueing for posture  3# standing marches with light UE support  Sit to Stand x6 x4 challenged without UE support, required rocking for momentum to stand   Seated: RTB hamstring curls 2x12 B LEs 3# LAQ x20 each   Neuro Re-ed:  Soccer ball kicks in // bars, BUE support x multiple repetitions each LE, with aide assisting in kicking ball. Pt went from B to unilateral UE support  Airex ball toss with multi-color ball  Airex ball bounce pass with multi-color ball Airex cone pick up and place down 3 cones x3 reps                                PT Short Term Goals - 08/21/20 0959      PT SHORT TERM GOAL #1   Title Patient will be independent in home exercise program to improve strength/mobility for better functional independence with ADLs.    Baseline 11/11: HEP given 12/21: HEP compliant    Time 4    Period Weeks    Status Achieved    Target Date 08/09/20             PT Long Term Goals - 08/21/20 0958      PT LONG TERM GOAL #1   Title Patient will increase FOTO score to equal to or greater than 51% to demonstrate statistically significant improvement in mobility and quality of life.    Baseline 11/11: 42% 12/21: 46.8%    Time 12    Period Weeks    Status Partially Met    Target Date 10/04/20      PT LONG TERM GOAL #2   Title Patient will demonstrate an improved Berg Balance Score of > 40/56 as to demonstrate improved balance with ADLs such as sitting/standing and transfer balance and reduced fall risk.    Baseline 11/11: 34/56 12/21: 37/56    Time 12    Period Weeks    Status Partially Met    Target Date 10/04/20      PT LONG TERM GOAL #3   Title Patient (> 24 years old) will complete five times sit to stand test in < 15 seconds without UE support indicating an increased LE strength and  improved balance    Baseline 11/11; 23.35 seconds with UE support 12/21: 27.85 with one near LOB    Time 12    Period Weeks    Status On-going    Target Date 10/04/20      PT LONG TERM GOAL #4   Title Patient will increase 10 meter walk test to >1.38m/s with LRAD  as to improve gait speed for better community ambulation and to reduce fall risk.    Baseline 11/11: 0.74 m/s without AD with one near LOB 12/21: 1.08 m/s    Time 12    Period Weeks    Status Achieved      PT LONG TERM GOAL #5   Title Patient will increase BLE gross strength to 4+/5 as to improve functional strength for independent gait, increased standing tolerance and increased ADL ability.    Baseline 11/11: see note    Time 12    Period Weeks    Status Partially Met    Target Date 10/04/20                 Plan - 09/20/20 1012    Clinical Impression Statement The patient required cueing for correct exercise technique and posture.  The patient demonstrates good ability to perform balance activities with 1 LOB which he was able to recover from by taking a step.  Patient continues to benefit from additional skilled PT services to improve LE strength and balance for improved  quality of life.    Personal Factors and Comorbidities Age;Comorbidity 3+;Fitness;Past/Current Experience;Sex;Social Background;Time since onset of injury/illness/exacerbation;Transportation    Comorbidities AAA, arthritis, atherosclerotic heart disease, (s/p CABG), benign neoplasm of ascending/descending colon, COPD, CAD, dementia, dysrhythmia, frequent falls, GERD, hemorrhoid, HOH, HTN, PAF, prostate cancer.    Examination-Activity Limitations Bathing;Bend;Carry;Dressing;Continence;Stairs;Squat;Reach Overhead;Locomotion Level;Lift;Stand;Transfers;Toileting;Bed Mobility    Examination-Participation Restrictions Church;Cleaning;Community Activity;Driving;Interpersonal Relationship;Laundry;Volunteer;Shop;Personal Finances;Meal Prep;Yard Work     Product manager    Rehab Potential Fair    Clinical Impairments Affecting Rehab Potential (+) good support system, high prior level of function (-) currently undergoing radiation therapy for prostate cancer, memory deficits     PT Frequency 2x / week    PT Duration 12 weeks    PT Treatment/Interventions ADLs/Self Care Home Management;Aquatic Therapy;Cryotherapy;Moist Heat;Traction;Ultrasound;Therapeutic activities;Functional mobility training;Stair training;Gait training;DME Instruction;Therapeutic exercise;Balance training;Neuromuscular re-education;Patient/family education;Manual techniques;Passive range of motion;Energy conservation;Vestibular;Taping;Biofeedback;Electrical Stimulation;Iontophoresis 4mg /ml Dexamethasone;Dry needling;Visual/perceptual remediation/compensation;Cognitive remediation;Canalith Repostioning    PT Next Visit Plan balance, strength    PT Home Exercise Plan see above    Consulted and Agree with Plan of Care Patient;Family member/caregiver    Family Member Consulted aide           Patient will benefit from skilled therapeutic intervention in order to improve the following deficits and impairments:  Abnormal gait,Cardiopulmonary status limiting activity,Decreased activity tolerance,Decreased balance,Decreased knowledge of precautions,Decreased endurance,Decreased coordination,Decreased cognition,Decreased knowledge of use of DME,Decreased mobility,Difficulty walking,Decreased safety awareness,Decreased strength,Impaired flexibility,Impaired perceived functional ability,Impaired sensation,Postural dysfunction,Improper body mechanics,Pain,Impaired vision/preception,Decreased range of motion  Visit Diagnosis: Unsteadiness on feet  Muscle weakness (generalized)  Other abnormalities of gait and mobility  Chronic low back pain, unspecified back pain laterality, unspecified whether sciatica present  Difficulty in walking, not  elsewhere classified     Problem List Patient Active Problem List   Diagnosis Date Noted  . AAA (abdominal aortic aneurysm) without rupture ([place]) 06/29/2020  . LBD (Lewy body dementia) ([place]) 05/14/2020  . Numbness and tingling in left hand 03/02/2020  . Hematuria   . Acute kidney injury superimposed on CKD ([place])   . Sepsis due to gram-negative UTI ([place]) 12/26/2019  . Left nephrolithiasis 12/26/2019  . Hydronephrosis, left 12/26/2019  . Status post abdominal aortic aneurysm (AAA) repair 04/15/2019  . Do not intubate, cardiopulmonary resuscitation (CPR)-only code status 10/08/2018  . Prostate cancer ([place]) 02/25/2018  . Prostatic cyst 01/15/2018  . Blood in stool   . Abnormal feces   . Stricture and stenosis of esophagus   . Mild cognitive impairment 08/18/2017  . Senile purpura ([place]) 04/29/2017  . Anemia 04/29/2017  . Frequent falls 04/29/2017  . Simple chronic bronchitis ([place]) 12/17/2016  . Benign localized hyperplasia of prostate with urinary obstruction 07/15/2016  . Elevated PSA 07/15/2016  . Incomplete emptying of bladder 07/15/2016  . Urge incontinence 07/15/2016  . Hypothyroidism 04/25/2016  . Macular degeneration 04/25/2016  . Erectile dysfunction due to arterial insufficiency   . Ischemic heart disease with chronotropic incompetence   . CN (constipation) 02/10/2015  . DD (diverticular disease) 02/10/2015  . Weakness 02/10/2015  . Lichen planus 44/09/270  . Restless leg 02/10/2015  . 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 B12 deficiency 02/10/2015  . COPD (chronic obstructive pulmonary disease) ([place]) 11/14/2014  . Post Inflammatory Lung Changes 11/14/2014  . PAF (paroxysmal atrial fibrillation) (H[facility]) CHA2DS2-VASc = 4. AC = Eliquis   . Insomnia 12/09/2013  . OSA on CPAP   . Dyspnea on exertion - -essentially resolved with BB dose reduction & wgt loss 03/29/2013  . Overweight (  BMI 25.0-29.9) -- Wgt back up 01/17/2013  . Hx of CABG   . Essential hypertension    . Hyperlipidemia with target LDL less than 70   . Aorto-iliac disease ([place])   . BCC (basal cell carcinoma), eyelid 01/03/2013  . Allergic rhinitis 08/17/2009  . Adaptation reaction 05/28/2009  . Acid reflux 05/28/2009  . Arthritis, degenerative 05/28/2009  . 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 Carotid artery occlusion without infarction 06/01/2006  . PAD (peripheral artery disease) - bilateral common iliac stents 12/15/2005  . Atherosclerotic heart disease of artery bypass graft 09/01/1997    Hal Morales PT, DPT 09/20/2020, 10:14 AM  [place] MAIN [facility] SERVICES [address] [place], [place], [address] Phone: [phone]   Fax:  [phone]  Name: [name] MRN: [id number] Date of Birth: 1934/07/04

## 2020-09-25 ENCOUNTER — Ambulatory Visit: Payer: Medicare Other

## 2020-09-25 ENCOUNTER — Other Ambulatory Visit: Payer: Self-pay

## 2020-09-25 DIAGNOSIS — R2681 Unsteadiness on feet: Secondary | ICD-10-CM

## 2020-09-25 DIAGNOSIS — R2689 Other abnormalities of gait and mobility: Secondary | ICD-10-CM

## 2020-09-25 DIAGNOSIS — M545 Low back pain, unspecified: Secondary | ICD-10-CM

## 2020-09-25 DIAGNOSIS — G8929 Other chronic pain: Secondary | ICD-10-CM

## 2020-09-25 DIAGNOSIS — M6281 Muscle weakness (generalized): Secondary | ICD-10-CM

## 2020-09-25 NOTE — Therapy (Signed)
[place] MAIN [facility] SERVICES [address] [place], [place], [address] Phone: [phone]   Fax:  [phone]  Physical Therapy Treatment  Patient Details  Name: [name] MRN: [id number] Date of Birth: 25-Apr-1934 Referring Provider (PT): Jennings Books K    Encounter Date: 09/25/2020   PT End of Session - 09/25/20 0936    Visit Number 18    Number of Visits 24    Date for PT Re-Evaluation 10/04/20    Authorization Type 8/10    PT Start Time 0929    PT Stop Time 1011    PT Time Calculation (min) 42 min    Equipment Utilized During Treatment Gait belt    Activity Tolerance Patient tolerated treatment well    Behavior During Therapy WFL for tasks assessed/performed           Past Medical History:  Diagnosis Date  . AAA (abdominal aortic aneurysm) ([place]) 05/2019   [place] Rapid progression from June-Aug 2020 (4.6 - 5.4 cm): s/p EVAR (Dr. Lucky Cowboy [facility]): EVAR - 23 mm prox, 12 cm distal & x 18 cm length Gore Excluder Endoprosthesis Main body (via RFA distal to lowest Renal A); 20m x 14 cm L Contralateral Limb for L Iliac - extended with 8 mm x 6cm LifeStar stent (post-dilated with 7 mm DEB).  Additional R Iliac PTA not required.   . 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 ([place] 09/15/2017   09-15-2017 DIAGNOSIS:  A. COLON POLYP X 2, ASCENDING; COLD SNARE:  - TUBULAR ADENOMA.  - NEGATIVE FOR HIGH-GRADE DYSPLASIA AND MALIGNANCY.  - NO ADDITIONAL TISSUE IDENTIFIED; FECAL MATERIAL PRESENT.   B. COLON POLYP X 2, DESCENDING; COLD SNARE:  - ADENOCARCINOMA ARISING IN A DYSPLASTIC SESSILE SERRATED ADENOMA, WITH  LYMPHOVASCULAR INVASION, SEE COMMENT.  - TUBULAR ADENOMA, 2 FRAGMENTS, NEGATIVE FO  . Arthritis   . Basal cell carcinoma of eyelid 01/03/2013   2019 - R side of Nose  . Benign neoplasm of ascending colon   . Benign neoplasm of descending colon   . Bilateral iliac artery stenosis ([place] 2007   Bilateral Iliac A stenting; extension of EVAR limbs  into both Iliacs -- additional stent placed in L Iliac.  . Cancer ([place]    Skin cancer  . Colon polyps   . Complication of anesthesia    severe confusion agitation requiring hospital admission x 4 days  . Compression fracture of fourth lumbar vertebra ([place] 04/2019   - s/p Kyphoplasty; T12, L3-5 ([facility]  . COPD (chronic obstructive pulmonary disease) (HCC)    Centrilobular Emphysema  . Coronary artery disease involving native coronary artery without angina pectoris 1998   - s/p CABG, occluded SVG-D1; patent LIMA-LAD, SVG-OM, SVG- RCA  . Dementia ([place]    memory loss - started noting late 2019  . Falls frequently    broke foot  . Fracture 2021   left foot  . GERD (gastroesophageal reflux disease)   . Hemorrhoid 02/10/2015  . History of hiatal hernia   . History of kidney stones   . HOH (hard of hearing)   . Hypertension   . Lewy body dementia ([place] 04/04/2020  . Macular degeneration disease   . Osteoporosis   . PAF (paroxysmal atrial fibrillation) ([place]    on Eliquis for OAC - Rate Control   . Prostate cancer ([place]   . Prostate disorder   . Sciatic pain    Chronic  . Sleep apnea    cpap  . Temporary cerebral  vascular dysfunction 02/10/2015   Had negative work-up.  July, 2012.  No medication changes, had forgot them that day, got dehydrated.   . Urinary incontinence     Past Surgical History:  Procedure Laterality Date  . ABDOMINAL AORTIC ENDOVASCULAR STENT GRAFT Bilateral 05/18/2019   Procedure: ABDOMINAL AORTIC ENDOVASCULAR STENT GRAFT;  Surgeon: Algernon Huxley, MD;  Location: [facility] ORS;  Service: Vascular;  Laterality: Bilateral;  . ANKLE SURGERY Left    ORIF   . Arch aortogram and carotid aortogram  06/02/2006   Dr Oneida Alar did surgery  . BASAL CELL CARCINOMA EXCISION  04/2016   Dermatology  . CARDIAC CATHETERIZATION  01/30/1998    (Dr. Linard Millers): Native LAD & mRCA CTO. 90% D1. Mod Cx. SVG-D1 CTO. SVG-RCA & SVG-OM along with LIMA-LAD patent;  LV NORMAL Fxn  . CAROTID  ENDARTERECTOMY Left Oct. 29, 2007   Dr. Oneida Alar:  . CATARACT EXTRACTION W/PHACO Right 03/05/2017   Procedure: CATARACT EXTRACTION PHACO AND INTRAOCULAR LENS PLACEMENT (IOC);  Surgeon: Leandrew Koyanagi, MD;  Location: [facility] ORS;  Service: Ophthalmology;  Laterality: Right;  [place] 00:58.5 AP% 10.6 CDE 6.20 Fluid Pack lot # [id number]  . CATARACT EXTRACTION W/PHACO Left 04/15/2017   Procedure: CATARACT EXTRACTION PHACO AND INTRAOCULAR LENS PLACEMENT ([place])  Left;  Surgeon: Leandrew Koyanagi, MD;  Location: [place];  Service: Ophthalmology;  Laterality: Left;  . COLONOSCOPY WITH PROPOFOL N/A 09/15/2017   Procedure: COLONOSCOPY WITH PROPOFOL;  Surgeon: Lucilla Lame, MD;  Location: [facility] ENDOSCOPY;  Service: Endoscopy;  Laterality: N/A;  . COLONOSCOPY WITH PROPOFOL N/A 10/26/2018   Procedure: COLONOSCOPY WITH PROPOFOL;  Surgeon: Lucilla Lame, MD;  Location: [facility] ENDOSCOPY;  Service: Endoscopy;  Laterality: N/A;  . CORONARY ARTERY BYPASS GRAFT  1998   LIMA-LAD, SVG-OM, SVG-RPDA, SVG-DIAG  . CPET / MET  12/2012   Mild chronotropic incompetence - read 82% of predicted; also reduced effort; peak VO2 15.7 / 75% (did not reach Max effort) -- suggested ischemic response in last 1.5 minutes of exercise. Normal pulmonary function on PFTs but poor response to  . CYSTOSCOPY W/ RETROGRADES Left 01/13/2020   Procedure: CYSTOSCOPY WITH RETROGRADE PYELOGRAM;  Surgeon: Billey Co, MD;  Location: [facility] ORS;  Service: Urology;  Laterality: Left;  . CYSTOSCOPY WITH STENT PLACEMENT Left 12/26/2019   Procedure: CYSTOSCOPY WITH STENT PLACEMENT;  Surgeon: Billey Co, MD;  Location: [facility] ORS;  Service: Urology;  Laterality: Left;  . CYSTOSCOPY/URETEROSCOPY/HOLMIUM LASER/STENT PLACEMENT Left 01/13/2020   Procedure: CYSTOSCOPY/URETEROSCOPY/HOLMIUM LASER/STENT EXCHANGE;  Surgeon: Billey Co, MD;  Location: [facility] ORS;  Service: Urology;  Laterality: Left;  . ENDOVASCULAR REPAIR/STENT GRAFT N/A 05/18/2019    Procedure: ENDOVASCULAR REPAIR/STENT GRAFT;  Surgeon: Algernon Huxley, MD;  Location: [facility] INVASIVE CV LAB;; EVAR - 23 mm prox, 12 cm distal & x 18 cm length Gore Excluder Endoprosthesis Main body (via RFA distal to lowest Renal A); 67m x 14 cm L Contralateral Limb for L Iliac - extended with 8 mm x 6cm LifeStar stent; no additional R Iliac PTA needed.   . ESOPHAGOGASTRODUODENOSCOPY (EGD) WITH PROPOFOL N/A 09/15/2017   Procedure: ESOPHAGOGASTRODUODENOSCOPY (EGD) WITH PROPOFOL;  Surgeon: WLucilla Lame MD;  [facility];  Service: Endoscopy;  Laterality: N/A;  . FRACTURE SURGERY Right 03/19/2015   wrist  . FRACTURE SURGERY Left   . HIATAL HERNIA REPAIR    . ILIAC ARTERY STENT  12/15/2005   PTA and direct stenting rgt and lft common iliac arteries  . KYPHOPLASTY N/A 04/05/2019   Procedure:  KYPHOPLASTY T12, L3, L4 L5;  Surgeon: Hessie Knows, MD;  Location: [facility] ORS;  Service: Orthopedics;  Laterality: N/A;  . NM GATED MYOVIEW ([place] HX)  05/'19; 8/'20   a) [facility]: Low Risk - no ischemia or infarct.  EF > 65%. no RWMA;; b) [facility]): Normal wall motion.  No ischemia or infarction.  . OPEN REDUCTION INTERNAL FIXATION (ORIF) DISTAL RADIAL FRACTURE Left 01/13/2019   Procedure: OPEN REDUCTION INTERNAL FIXATION (ORIF) DISTAL  RADIAL FRACTURE - LEFT - SLEEP APNEA;  Surgeon: Hessie Knows, MD;  Location: [facility] ORS;  Service: Orthopedics;  Laterality: Left;  . ORIF ELBOW FRACTURE Right 01/01/2017   Procedure: OPEN REDUCTION INTERNAL FIXATION (ORIF) ELBOW/OLECRANON FRACTURE;  Surgeon: Hessie Knows, MD;  Location: [facility] ORS;  Service: Orthopedics;  Laterality: Right;  . ORIF WRIST FRACTURE Right 03/19/2015   Procedure: OPEN REDUCTION INTERNAL FIXATION (ORIF) WRIST FRACTURE;  Surgeon: Hessie Knows, MD;  Location: [facility] ORS;  Service: Orthopedics;  Laterality: Right;  . SHOULDER ARTHROSCOPY Right   . THORACIC AORTA - CAROTID ANGIOGRAM  October 2007   Dr. 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 TRANSTHORACIC ECHOCARDIOGRAM  February 2016   [facility]: Normal LV function. Dilated left atrium.  Domingo Dimes ECHOCARDIOGRAM  04/2019   [facility] April 20, 2019): EF 55-60 %.  Mild LVH.  Relatively normal valves.    There were no vitals filed for this visit.   Subjective Assessment - 09/25/20 0956    Subjective Patient presents with his aide, no falls or LOB since last session. Will be getting a procedure for his trigger finger in the next month.    Pertinent History Patient is returning to PT for evaluation of balance. Patient has been seen by this therapist in the past. PMH AAA, arthritis, basal cell carcinoma of eyelid, benign neoplasm of ascending/descending colon, bilateral iliac artery stenosis, colon polyps, compression fx of 4th lumbar vertebra (s/p kyphoplasty T12, L3-5), COPD, CAD, dementia, falls,  GERD, hemorroids, HOH, HTN, Lewy Body Dementia, macular degeneration disease, osteoporosis, PAF, prostate cancer, prostate disorder, sciatic pain, sleep apnea, temporary cerebral vascular dysfunction. Comes with aide, having multiple near falls per day per aide, mainly to the sides.    Limitations Walking;Standing;House hold activities;Other (comment);Lifting;Sitting    How long can you sit comfortably? n/a back pain no longer limiting    How long can you stand comfortably? 10-12 with walker    How long can you walk comfortably? walks 15 minutes with a walker    Diagnostic tests MRI from [place] dated 03/28/2019 report was reviewed today. Acute compression fractures are noted at T12, L3, and L4. He has facet arthropathy at the lower lumbar levels. At L3-4 he has moderate central stenosis.    Patient Stated Goals walk without stumbling, be able to be steady enough to not have to have someone holding his gait belt.               BP at start of session: 114/60    TherEx:     Standing with # 5 ankle weight: CGA for stability -Hip abduction with BUE upper extremity support, cueing for neutral foot alignment for correct muscle activation, 4x length of // bars -Hip flexion with BUE upper extremity support, cueing for body mechanics, speed of muscle recruitment for optimal strengthening and stabilization 4x length of // bars; 2x4 between feet to increase base of support  -hip extension 15x each  LE    STS throw basketball at aide ; requires SUE to stand up and sit down 10x; very challenging for patient.    Neuro Re-ed:  airex pad: -6' step toe taps SUE support 20x each LE, cues for upright posture  6" step lateral step up/down 15x each LE   -throw rainbow ball back and forth with aide x 18 tosses; no LOB   -step over orange hurdle and back 12x each LE   Half foam roller df/pf 15x with BUE support   Ambulate 6x 86 ft with horizontal head turns, patient with excessive drift to Left, limited spatial awareness of LLE.   Forward backward ambulation with 2x4 between feet to promote widened BOS; x4 trials initially challenging with posterior ambulation improved with repetition.     Pt educated throughout session about proper posture and technique with exercises. Improved exercise technique, movement at target joints, use of target muscles after min to mod verbal, visual, tactile cues.       Patient continues to have left sided visual deficit that result in left drift and limited safety awareness in standing. Upon further assessment increased deficit of left visual field noted compared to previous assessments. Aide is aware. Patient continues to benefit from additional skilled PT services to improve LE strength and balance for improved quality of life.                   PT Education - 09/25/20 0931    Education provided Yes    Education Details exercise technique, body mechanics    Person(s) Educated Patient    Methods Explanation;Demonstration;Tactile  cues;Verbal cues    Comprehension Verbalized understanding;Returned demonstration;Verbal cues required;Tactile cues required            PT Short Term Goals - 08/21/20 0959      PT SHORT TERM GOAL #1   Title Patient will be independent in home exercise program to improve strength/mobility for better functional independence with ADLs.    Baseline 11/11: HEP given 12/21: HEP compliant    Time 4    Period Weeks    Status Achieved    Target Date 08/09/20             PT Long Term Goals - 08/21/20 0958      PT LONG TERM GOAL #1   Title Patient will increase FOTO score to equal to or greater than 51% to demonstrate statistically significant improvement in mobility and quality of life.    Baseline 11/11: 42% 12/21: 46.8%    Time 12    Period Weeks    Status Partially Met    Target Date 10/04/20      PT LONG TERM GOAL #2   Title Patient will demonstrate an improved Berg Balance Score of > 40/56 as to demonstrate improved balance with ADLs such as sitting/standing and transfer balance and reduced fall risk.    Baseline 11/11: 34/56 12/21: 37/56    Time 12    Period Weeks    Status Partially Met    Target Date 10/04/20      PT LONG TERM GOAL #3   Title Patient (> 72 years old) will complete five times sit to stand test in < 15 seconds without UE support indicating an increased LE strength and improved balance    Baseline 11/11; 23.35 seconds with UE support 12/21: 27.85 with one near LOB    Time 12    Period Weeks    Status On-going  Target Date 10/04/20      PT LONG TERM GOAL #4   Title Patient will increase 10 meter walk test to >1.65m/s with LRAD  as to improve gait speed for better community ambulation and to reduce fall risk.    Baseline 11/11: 0.74 m/s without AD with one near LOB 12/21: 1.08 m/s    Time 12    Period Weeks    Status Achieved      PT LONG TERM GOAL #5   Title Patient will increase BLE gross strength to 4+/5 as to improve functional strength for  independent gait, increased standing tolerance and increased ADL ability.    Baseline 11/11: see note    Time 12    Period Weeks    Status Partially Met    Target Date 10/04/20                 Plan - 09/25/20 1649    Clinical Impression Statement Patient continues to have left sided visual deficit that result in left drift and limited safety awareness in standing. Upon further assessment increased deficit of left visual field noted compared to previous assessments. Aide is aware. Patient continues to benefit from additional skilled PT services to improve LE strength and balance for improved quality of life.    Personal Factors and Comorbidities Age;Comorbidity 3+;Fitness;Past/Current Experience;Sex;Social Background;Time since onset of injury/illness/exacerbation;Transportation    Comorbidities AAA, arthritis, atherosclerotic heart disease, (s/p CABG), benign neoplasm of ascending/descending colon, COPD, CAD, dementia, dysrhythmia, frequent falls, GERD, hemorrhoid, HOH, HTN, PAF, prostate cancer.    Examination-Activity Limitations Bathing;Bend;Carry;Dressing;Continence;Stairs;Squat;Reach Overhead;Locomotion Level;Lift;Stand;Transfers;Toileting;Bed Mobility    Examination-Participation Restrictions Church;Cleaning;Community Activity;Driving;Interpersonal Relationship;Laundry;Volunteer;Shop;Personal Finances;Meal Prep;Yard Work    Product manager    Rehab Potential Fair    Clinical Impairments Affecting Rehab Potential (+) good support system, high prior level of function (-) currently undergoing radiation therapy for prostate cancer, memory deficits     PT Frequency 2x / week    PT Duration 12 weeks    PT Treatment/Interventions ADLs/Self Care Home Management;Aquatic Therapy;Cryotherapy;Moist Heat;Traction;Ultrasound;Therapeutic activities;Functional mobility training;Stair training;Gait training;DME Instruction;Therapeutic exercise;Balance  training;Neuromuscular re-education;Patient/family education;Manual techniques;Passive range of motion;Energy conservation;Vestibular;Taping;Biofeedback;Electrical Stimulation;Iontophoresis 4mg /ml Dexamethasone;Dry needling;Visual/perceptual remediation/compensation;Cognitive remediation;Canalith Repostioning    PT Next Visit Plan balance, strength    PT Home Exercise Plan see above    Consulted and Agree with Plan of Care Patient;Family member/caregiver    Family Member Consulted aide           Patient will benefit from skilled therapeutic intervention in order to improve the following deficits and impairments:  Abnormal gait,Cardiopulmonary status limiting activity,Decreased activity tolerance,Decreased balance,Decreased knowledge of precautions,Decreased endurance,Decreased coordination,Decreased cognition,Decreased knowledge of use of DME,Decreased mobility,Difficulty walking,Decreased safety awareness,Decreased strength,Impaired flexibility,Impaired perceived functional ability,Impaired sensation,Postural dysfunction,Improper body mechanics,Pain,Impaired vision/preception,Decreased range of motion  Visit Diagnosis: Unsteadiness on feet  Muscle weakness (generalized)  Other abnormalities of gait and mobility  Chronic low back pain, unspecified back pain laterality, unspecified whether sciatica present     Problem List Patient Active Problem List   Diagnosis Date Noted  . AAA (abdominal aortic aneurysm) without rupture ([place]) 06/29/2020  . LBD (Lewy body dementia) ([place]) 05/14/2020  . Numbness and tingling in left hand 03/02/2020  . Hematuria   . Acute kidney injury superimposed on CKD ([place])   . Sepsis due to gram-negative UTI ([place]) 12/26/2019  . Left nephrolithiasis 12/26/2019  . Hydronephrosis, left 12/26/2019  . Status post abdominal aortic aneurysm (AAA) repair 04/15/2019  . Do not intubate, cardiopulmonary resuscitation (CPR)-only code  status 10/08/2018  . Prostate cancer  ([place]) 02/25/2018  . Prostatic cyst 01/15/2018  . Blood in stool   . Abnormal feces   . Stricture and stenosis of esophagus   . Mild cognitive impairment 08/18/2017  . Senile purpura ([place]) 04/29/2017  . Anemia 04/29/2017  . Frequent falls 04/29/2017  . Simple chronic bronchitis ([place]) 12/17/2016  . Benign localized hyperplasia of prostate with urinary obstruction 07/15/2016  . Elevated PSA 07/15/2016  . Incomplete emptying of bladder 07/15/2016  . Urge incontinence 07/15/2016  . Hypothyroidism 04/25/2016  . Macular degeneration 04/25/2016  . Erectile dysfunction due to arterial insufficiency   . Ischemic heart disease with chronotropic incompetence   . CN (constipation) 02/10/2015  . DD (diverticular disease) 02/10/2015  . Weakness 02/10/2015  . Lichen planus 79/39/6886  . Restless leg 02/10/2015  . 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 B12 deficiency 02/10/2015  . COPD (chronic obstructive pulmonary disease) ([place]) 11/14/2014  . Post Inflammatory Lung Changes 11/14/2014  . PAF (paroxysmal atrial fibrillation) (HCC) CHA2DS2-VASc = 4. AC = Eliquis   . Insomnia 12/09/2013  . OSA on CPAP   . Dyspnea on exertion - -essentially resolved with BB dose reduction & wgt loss 03/29/2013  . Overweight (BMI 25.0-29.9) -- Wgt back up 01/17/2013  . Hx of CABG   . Essential hypertension   . Hyperlipidemia with target LDL less than 70   . Aorto-iliac disease ([place])   . BCC (basal cell carcinoma), eyelid 01/03/2013  . Allergic rhinitis 08/17/2009  . Adaptation reaction 05/28/2009  . Acid reflux 05/28/2009  . Arthritis, degenerative 05/28/2009  . 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 Carotid artery occlusion without infarction 06/01/2006  . PAD (peripheral artery disease) - bilateral common iliac stents 12/15/2005  . Atherosclerotic heart disease of artery bypass graft 09/01/1997   Janna Arch, PT, DPT   09/25/2020, 4:50 PM  [place] MAIN  [facility] SERVICES [address] [place], [place], [address] Phone: [phone]   Fax:  [phone]  Name: [name] MRN: [id number] Date of Birth: 10-Apr-1934

## 2020-09-27 ENCOUNTER — Other Ambulatory Visit: Payer: Self-pay

## 2020-09-27 ENCOUNTER — Emergency Department: Payer: Medicare Other

## 2020-09-27 ENCOUNTER — Emergency Department
Admission: EM | Admit: 2020-09-27 | Discharge: 2020-09-27 | Disposition: A | Discharge status: Home / Self Care | Payer: Medicare Other | Attending: Emergency Medicine | Admitting: Emergency Medicine

## 2020-09-27 ENCOUNTER — Ambulatory Visit: Payer: Medicare Other

## 2020-09-27 ENCOUNTER — Encounter: Payer: Self-pay | Admitting: Radiology

## 2020-09-27 DIAGNOSIS — E039 Hypothyroidism, unspecified: Secondary | ICD-10-CM | POA: Insufficient documentation

## 2020-09-27 DIAGNOSIS — Z87891 Personal history of nicotine dependence: Secondary | ICD-10-CM | POA: Insufficient documentation

## 2020-09-27 DIAGNOSIS — R2681 Unsteadiness on feet: Secondary | ICD-10-CM

## 2020-09-27 DIAGNOSIS — J449 Chronic obstructive pulmonary disease, unspecified: Secondary | ICD-10-CM | POA: Diagnosis not present

## 2020-09-27 DIAGNOSIS — Z85828 Personal history of other malignant neoplasm of skin: Secondary | ICD-10-CM | POA: Diagnosis not present

## 2020-09-27 DIAGNOSIS — F0390 Unspecified dementia without behavioral disturbance: Secondary | ICD-10-CM | POA: Diagnosis not present

## 2020-09-27 DIAGNOSIS — Z79899 Other long term (current) drug therapy: Secondary | ICD-10-CM | POA: Diagnosis not present

## 2020-09-27 DIAGNOSIS — Z8546 Personal history of malignant neoplasm of prostate: Secondary | ICD-10-CM | POA: Diagnosis not present

## 2020-09-27 DIAGNOSIS — I10 Essential (primary) hypertension: Secondary | ICD-10-CM | POA: Diagnosis not present

## 2020-09-27 DIAGNOSIS — Z85038 Personal history of other malignant neoplasm of large intestine: Secondary | ICD-10-CM | POA: Insufficient documentation

## 2020-09-27 DIAGNOSIS — R531 Weakness: Secondary | ICD-10-CM | POA: Diagnosis not present

## 2020-09-27 DIAGNOSIS — M6281 Muscle weakness (generalized): Secondary | ICD-10-CM

## 2020-09-27 DIAGNOSIS — Z20822 Contact with and (suspected) exposure to covid-19: Secondary | ICD-10-CM | POA: Diagnosis not present

## 2020-09-27 DIAGNOSIS — I2510 Atherosclerotic heart disease of native coronary artery without angina pectoris: Secondary | ICD-10-CM | POA: Diagnosis not present

## 2020-09-27 DIAGNOSIS — R2689 Other abnormalities of gait and mobility: Secondary | ICD-10-CM

## 2020-09-27 DIAGNOSIS — Z7901 Long term (current) use of anticoagulants: Secondary | ICD-10-CM | POA: Diagnosis not present

## 2020-09-27 DIAGNOSIS — Z951 Presence of aortocoronary bypass graft: Secondary | ICD-10-CM | POA: Insufficient documentation

## 2020-09-27 DIAGNOSIS — I4891 Unspecified atrial fibrillation: Secondary | ICD-10-CM | POA: Insufficient documentation

## 2020-09-27 LAB — CBC
HCT: 34.4 % — ABNORMAL LOW (ref 39.0–52.0)
Hemoglobin: 11.4 g/dL — ABNORMAL LOW (ref 13.0–17.0)
MCH: 31.1 pg (ref 26.0–34.0)
MCHC: 33.1 g/dL (ref 30.0–36.0)
MCV: 94 fL (ref 80.0–100.0)
Platelets: 118 10*3/uL — ABNORMAL LOW (ref 150–400)
RBC: 3.66 MIL/uL — ABNORMAL LOW (ref 4.22–5.81)
RDW: 14.8 % (ref 11.5–15.5)
WBC: 7.2 10*3/uL (ref 4.0–10.5)
nRBC: 0 % (ref 0.0–0.2)

## 2020-09-27 LAB — URINALYSIS, COMPLETE (UACMP) WITH MICROSCOPIC
Bacteria, UA: NONE SEEN
Bilirubin Urine: NEGATIVE
Glucose, UA: NEGATIVE mg/dL
Hgb urine dipstick: NEGATIVE
Ketones, ur: NEGATIVE mg/dL
Leukocytes,Ua: NEGATIVE
Nitrite: NEGATIVE
Protein, ur: NEGATIVE mg/dL
Specific Gravity, Urine: >1.046 — ABNORMAL HIGH (ref 1.005–1.030)
pH: 7 (ref 5.0–8.0)

## 2020-09-27 LAB — COMPREHENSIVE METABOLIC PANEL
ALT: 14 U/L (ref 0–44)
AST: 21 U/L (ref 15–41)
Albumin: 3.6 g/dL (ref 3.5–5.0)
Alkaline Phosphatase: 78 U/L (ref 38–126)
Anion gap: 8 (ref 5–15)
BUN: 21 mg/dL (ref 8–23)
CO2: 29 mmol/L (ref 22–32)
Calcium: 8.9 mg/dL (ref 8.9–10.3)
Chloride: 103 mmol/L (ref 98–111)
Creatinine, Ser: 1.07 mg/dL (ref 0.61–1.24)
GFR, Estimated: >60 mL/min (ref >60)
Glucose, Bld: 64 mg/dL — ABNORMAL LOW (ref 70–99)
Potassium: 4.3 mmol/L (ref 3.5–5.1)
Sodium: 140 mmol/L (ref 135–145)
Total Bilirubin: 0.9 mg/dL (ref 0.3–1.2)
Total Protein: 6.3 g/dL — ABNORMAL LOW (ref 6.5–8.1)

## 2020-09-27 LAB — APTT: aPTT: 39 seconds — ABNORMAL HIGH (ref 24–36)

## 2020-09-27 LAB — PROTIME-INR
INR: 1.5 — ABNORMAL HIGH (ref 0.8–1.2)
Prothrombin Time: 17.3 seconds — ABNORMAL HIGH (ref 11.4–15.2)

## 2020-09-27 LAB — DIFFERENTIAL
Abs Immature Granulocytes: 0.01 10*3/uL (ref 0.00–0.07)
Basophils Absolute: 0.1 10*3/uL (ref 0.0–0.1)
Basophils Relative: 1 %
Eosinophils Absolute: 0.2 10*3/uL (ref 0.0–0.5)
Eosinophils Relative: 3 %
Immature Granulocytes: 0 %
Lymphocytes Relative: 30 %
Lymphs Abs: 2.2 10*3/uL (ref 0.7–4.0)
Monocytes Absolute: 0.8 10*3/uL (ref 0.1–1.0)
Monocytes Relative: 11 %
Neutro Abs: 4 10*3/uL (ref 1.7–7.7)
Neutrophils Relative %: 55 %

## 2020-09-27 LAB — CBG MONITORING, ED
Glucose-Capillary: 96 mg/dL (ref 70–99)
Glucose-Capillary: 112 mg/dL — ABNORMAL HIGH (ref 70–99)

## 2020-09-27 LAB — SARS CORONAVIRUS 2 BY RT PCR (HOSPITAL ORDER, PERFORMED IN ~~LOC~~ HOSPITAL LAB): SARS Coronavirus 2: NEGATIVE

## 2020-09-27 MED ORDER — IOHEXOL 350 MG/ML SOLN
75.0000 mL | Freq: Once | INTRAVENOUS | Status: AC | PRN
  Administered 2020-09-27: 75 mL via INTRAVENOUS

## 2020-09-27 MED ORDER — SODIUM CHLORIDE 0.9% FLUSH: 3.0000 mL | Freq: Once | INTRAVENOUS | Status: DC

## 2020-09-27 NOTE — ED Notes (Addendum)
Pt in CT. Code stroke button pushed. LKW [id number]

## 2020-09-27 NOTE — ED Notes (Signed)
Code  stroke  cnl  per  DR.[place]

## 2020-09-27 NOTE — Discharge Instructions (Signed)
MRI was negative.  Discuss the use of a medication called florinef. It can be used for orthostatic hypotensive. Return to ER for any other concerns    I would defer any of the florinef decisions to outpatient side if there is no orthostatic hypotension currently. This might simply be waxing and waning neurological status in a patient with severe neruodegeneration. Not much you can do about it honsetly

## 2020-09-27 NOTE — ED Notes (Signed)
Pt returned from MRI °

## 2020-09-27 NOTE — Progress Notes (Signed)
   09/27/20 1107  Clinical Encounter Type  Visited With Patient and family together  Visit Type Follow-up;Spiritual support  Referral From Nurse  Consult/Referral To Chaplain  Spiritual Encounters  Spiritual Needs Prayer;Emotional  Advance Directives (For Healthcare)  Does Patient Have a Medical Advance Directive? Yes  Does patient want to make changes to medical advance directive? No - Patient declined  Type of Paramedic of [place];Living will  Copy of [place] in Chart? No - copy requested  Copy of Living Will in Chart? No - copy requested  Would patient like information on creating a medical advance directive? No - Patient declined  [place]  Does Patient Have a Mental Health Advance Directive? No  Would patient like information on creating a mental health advance directive? No - Patient declined  While rounding I checked in on Mr. [name] and his wife.  [name] stated her husband is doing better but he is ready to eat., but they are waiting to run more tests.  Pt was sitting up and slightly making conversation with his wife and me.  Prayer was rendered and received

## 2020-09-27 NOTE — Therapy (Signed)
[place] MAIN [facility] SERVICES [address] [place], [place], [address] Phone: [phone]   Fax:  [phone]  Patient Details  Name: [name] MRN: [id number] Date of Birth: 10/04/1933 Referring Provider:  Vladimir Crofts, MD  Encounter Date: 09/27/2020  Patient's adie reports patient has been having more episodes of unsteadiness and challenge with picking up/placing feet requiring adie to catch him more. No full falls but if aide was not present may have fallen.   BP at start of session. Seated 95/45 standing 87/54    Agreeable to seated tasks only, aide agreeable to call son.  -tap PT foot into ER. Terminated when patient became increasingly confused with increased slurring of words and limited visual scan.   Nerve Special Test: Cranial Nerves:  Smile (slight abnormality-at baseline is slightly alternated)  Stick out tongue: slight drift to right Puff out cheeks: equal bilaterally Raise eyebrows: unable to perform (confused)  Upon attempt to stand and ambulate patient began dragging R foot with drastic change in gait and balance. Patient stopped, wheelchair obtained and patient wheeled to ED with aide. Aide called son and informed of going to ED with son agreeing with POC. Patient and aide admitted for triage.  Janna Arch, PT, DPT   09/27/2020, 12:50 PM  [place] MAIN [facility] SERVICES [address] [place], [place], [address] Phone: [phone]   Fax:  [phone]

## 2020-09-27 NOTE — ED Notes (Signed)
CODE  STROKE  CALLED  TO  CARELINK  

## 2020-09-27 NOTE — Progress Notes (Signed)
Pg Code Stroke. Upon arrival son was in room, patient taken to CT. Son says he thought his dad was okay this morning when he left hi, care giver noticed him off balance and different. He is grateful for quick response of medical team. I asked to be paged if I could be of any further assist in any other way.

## 2020-09-27 NOTE — ED Notes (Signed)
Patient transported to MRI 

## 2020-09-27 NOTE — ED Triage Notes (Signed)
Arrives from PT, where they report during therapy session at [place] noted worsening right foot dragging, decreased visual spacial awareness bilaterally, and worsening slurred speech.

## 2020-09-27 NOTE — ED Notes (Signed)
Found patient with IV out and stuck to side rail.  Blood splatter on tshirt. Catheter intact.

## 2020-09-27 NOTE — ED Notes (Signed)
Dr Jari Pigg updated on pt's vitals and orthostatics. Labs stuck in tube station, walked to lab

## 2020-09-27 NOTE — Consult Note (Signed)
Neurology Consultation  Reason for Consult: code stroke Referring Physician: Dr Quentin Cornwall  CC: Right-sided weakness, slurred speech  History is obtained from: Patient, chart  HPI: [name] is a 87 y.o. 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  He had not been perfect in his balance for the last couple of days but it was more worse this morning than the prior 2 days. The patient usually goes to physical therapy for help with his gait training as well as deconditioning with this morning was noted to be a little bit more weaker on the right-at that time also his systolic blood pressure was somewhat in the 90s. The caretaker has informed me that the patient has been having trouble with blood pressure fluctuations where the drop very low when he stands up and are higher when he is laying down. There was no facial droop but patient was also noticed to be more slurred.  At baseline he does have some slurred speech. His last clinic visit with neurology was in November 2021 with Dr. Manuella Ghazi at [facility], who he follows with for Lewy body dementia and parkinsonism and was started on Seroquel in November for symptoms of hallucination related to his Lewy body dementia. In November, he had been evaluated for Lewy body dementia given the presentation with memory loss, REM sleep behavior disorder, confusion,  visual hallucinations and delusions, drooling, shuffled gait, hunched posture, imbalance and resting left hand tremor along with hypophonia and bradykinesia and cogwheeling.  His parkinsonian symptoms probably started happening somewhere around 2016.  LKW: Unclear-likely 2 days ago tpa given?: no, outside the window, likely not a stroke Premorbid modified Rankin scale (mRS): 4 ROS performed and negative except as noted in HPI  Past Medical History:  Diagnosis Date  . AAA (abdominal aortic aneurysm) ([place]) 05/2019   Relativley Rapid progression from June-Aug 2020 (4.6 - 5.4 cm): s/p EVAR (Dr. Lucky Cowboy [facility]): EVAR - 23 mm prox, 12 cm distal & x 18 cm length Gore Excluder Endoprosthesis Main body (via RFA distal to lowest Renal A); 30mm x 14 cm L Contralateral Limb for L Iliac - extended with 8 mm x 6cm LifeStar stent (post-dilated with 7 mm DEB).  Additional R Iliac PTA not required.   . 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 ([place]) 09/15/2017   09-15-2017 DIAGNOSIS:  A. COLON POLYP X 2, ASCENDING; COLD SNARE:  - TUBULAR ADENOMA.  - NEGATIVE FOR HIGH-GRADE DYSPLASIA AND MALIGNANCY.  - NO ADDITIONAL TISSUE IDENTIFIED; FECAL MATERIAL PRESENT.   B. COLON POLYP X 2, DESCENDING; COLD SNARE:  - ADENOCARCINOMA ARISING IN A DYSPLASTIC SESSILE SERRATED ADENOMA, WITH  LYMPHOVASCULAR INVASION, SEE COMMENT.  - TUBULAR ADENOMA, 2 FRAGMENTS, NEGATIVE FO  . Arthritis   . Basal cell carcinoma of eyelid 01/03/2013   2019 - R side of Nose  . Benign neoplasm of ascending colon   . Benign neoplasm of descending colon   . Bilateral  iliac artery stenosis ([place]) 2007   Bilateral Iliac A stenting; extension of EVAR limbs into both Iliacs -- additional stent placed in L Iliac.  . Cancer ([place])    Skin cancer  . Colon polyps   . Complication of anesthesia    severe confusion agitation requiring hospital admission x 4 days  . Compression fracture of fourth lumbar vertebra ([place]) 04/2019   - s/p Kyphoplasty; T12, L3-5 [facility])  . COPD  (chronic obstructive pulmonary disease) (HCC)    Centrilobular Emphysema  . Coronary artery disease involving native coronary artery without angina pectoris 1998   - s/p CABG, occluded SVG-D1; patent LIMA-LAD, SVG-OM, SVG- RCA  . Dementia ([place])    memory loss - started noting late 2019  . Falls frequently    broke foot  . Fracture 2021   left foot  . GERD (gastroesophageal reflux disease)   . Hemorrhoid 02/10/2015  . History of hiatal hernia   . History of kidney stones   . HOH (hard of hearing)   . Hypertension   . Lewy body dementia ([place]) 04/04/2020  . Macular degeneration disease   . Osteoporosis   . PAF (paroxysmal atrial fibrillation) ([place])    on Eliquis for OAC - Rate Control   . Prostate cancer ([place])   . Prostate disorder   . Sciatic pain    Chronic  . Sleep apnea    cpap  . Temporary cerebral vascular dysfunction 02/10/2015   Had negative work-up.  July, 2012.  No medication changes, had forgot them that day, got dehydrated.   . Urinary incontinence         Family History  Problem Relation Age of Onset  . Ulcers Mother        Peptic  . Dementia Mother   . Alcohol abuse Father      Social History:   reports that he quit smoking about 23 years ago. His smoking use included cigarettes. He has never used smokeless tobacco. He reports that he does not drink alcohol and does not use drugs.  Medications  Current Facility-Administered Medications:  .  sodium chloride flush (NS) 0.9 % injection 3 mL, 3 mL, Intravenous, Once, Van[place], MD  Current Outpatient Medications:  .  acetaminophen (TYLENOL) 500 MG tablet, Take 500-1,000 mg by mouth every 6 (six) hours as needed for mild pain or moderate pain. , Disp: , Rfl:  .  Ascorbic Acid (VITAMIN C) 1000 MG tablet, Take 1,000 mg by mouth daily., Disp: , Rfl:  .  Cyanocobalamin (VITAMIN B-12) 500 MCG SUBL, Place 500 mcg under the tongue daily. , Disp: , Rfl:  .  ELIQUIS 5 MG TABS tablet, TAKE ONE TABLET TWICE DAILY,  Disp: 60 tablet, Rfl: 5 .  ezetimibe (ZETIA) 10 MG tablet, TAKE 1 TABLET BY MOUTH DAILY, Disp: 90 tablet, Rfl: 0 .  finasteride (PROSCAR) 5 MG tablet, TAKE ONE TABLET BY MOUTH EVERY DAY, Disp: 90 tablet, Rfl: 3 .  fluticasone (FLONASE) 50 MCG/ACT nasal spray, PLACE TWO SPRAYS IN BOTH NOSRTILS EVERY DAY AS NEEDED FOR ALLERGIES, Disp: 16 g, Rfl: 5 .  lisinopril (ZESTRIL) 2.5 MG tablet, Take 1 tablet (2.5 mg total) by mouth daily., Disp: , Rfl:  .  Magnesium Oxide 500 MG TABS, Take 500 mg by mouth every evening., Disp: , Rfl:  .  melatonin 3 MG TABS tablet, Take 0.5 tablets (1.5 mg total) by mouth at bedtime., Disp: , Rfl: 0 .  metoprolol succinate (TOPROL-XL) 50 MG 24 hr  tablet, TAKE ONE TABLET BY MOUTH EVERY DAY TAKE WITH OR IMMEDIATELY FOLLOWING A MEAL, Disp: 90 tablet, Rfl: 0 .  Multiple Vitamins-Minerals (PRESERVISION AREDS 2 PO), Take 1 tablet by mouth 2 (two) times daily. , Disp: , Rfl:  .  MYRBETRIQ 50 MG TB24 tablet, TAKE ONE TABLET BY MOUTH EVERY DAY, Disp: 30 tablet, Rfl: 11 .  nitroGLYCERIN (NITROSTAT) 0.4 MG SL tablet, Place 1 tablet (0.4 mg total) under the tongue every 5 (five) minutes as needed for chest pain., Disp: 20 tablet, Rfl: 1 .  pantoprazole (PROTONIX) 40 MG tablet, TAKE ONE TABLET BY MOUTH EVERY DAY, Disp: 90 tablet, Rfl: 1 .  QUEtiapine (SEROQUEL) 25 MG tablet, Take 12.5 mg by mouth. , Disp: , Rfl:  .  rosuvastatin (CRESTOR) 20 MG tablet, TAKE ONE TABLET BY MOUTH EVERY DAY, Disp: 90 tablet, Rfl: 4 .  umeclidinium-vilanterol (ANORO ELLIPTA) 62.5-25 MCG/INH AEPB, Inhale 1 puff into the lungs daily. , Disp: , Rfl:    Exam: Current vital signs: BP 108/65 (BP Location: Right Arm)   Pulse 68   Temp 97.6 F (36.4 C) (Oral)   Resp 17   Ht 5\' 10"  (1.778 m)   Wt 79.4 kg   SpO2 100%   BMI 25.11 kg/m  Vital signs in last 24 hours: Temp:  [97.6 F (36.4 C)] 97.6 F (36.4 C) (01/27 1025) Pulse Rate:  [68] 68 (01/27 1025) Resp:  [17] 17 (01/27 1025) BP: (108)/(65) 108/65  (01/27 1025) SpO2:  [100 %] 100 % (01/27 1025) Weight:  [79.4 kg] 79.4 kg (01/27 1025) Laying down his systolic blood pressure was in the 1 56-1 58 range when I examined him. General: Awake alert in no distress HEENT: Normocephalic atraumatic Cardiovascular: Regular rate rhythm Abdomen nondistended nontender Neurological exam Awake alert oriented to self, place and time. Was able to tell me the correct age and month. Speech is mild to moderately dysarthric No evidence of aphasia but is definitely hypophonic. Able to follow commands Cranial nerves: Pupils equal round reactive light, extraocular movements are intact, visual fields are full, loss of central vision in the left eye.  Face appears symmetric with hypo-mimia, tongue and palate midline. Motor exam: With mild cogwheeling in the left upper extremity, no cogwheeling in the right upper extremity.  No drift in any of the 4 extremities.  Subtle weakness 4+/5 in the right lower extremity compared to the left but no vertical drift in either.  There is definite Lee bradykinesia worse on the left than the right.  Prior neurology notes mention leaning to the right when standing up and I would resume that there was some mild right-sided weakness at baseline. Sensory exam: Intact light touch without extinction Coordination: No dysmetria. NIH stroke scale-1 for dysarthria   Labs I have reviewed labs in epic and the results pertinent to this consultation are:  CBC    Component Value Date/Time   WBC 7.8 07/17/2020 1046   WBC 7.5 04/04/2020 1905   RBC 3.98 (L) 07/17/2020 1046   RBC 4.12 (L) 04/04/2020 1905   HGB 12.0 (L) 07/17/2020 1046   HCT 36.3 (L) 07/17/2020 1046   PLT 138 (L) 07/17/2020 1046   MCV 91 07/17/2020 1046   MCV 95 09/28/2014 0626   MCH 30.2 07/17/2020 1046   MCH 30.6 04/04/2020 1905   MCHC 33.1 07/17/2020 1046   MCHC 33.0 04/04/2020 1905   RDW 13.5 07/17/2020 1046   RDW 14.0 09/28/2014 0626   LYMPHSABS 0.6 (L)  12/26/2019 2010  LYMPHSABS 1.9 06/14/2019 1145   LYMPHSABS 2.3 09/28/2014 0626   MONOABS 0.7 12/26/2019 2010   MONOABS 1.3 (H) 09/28/2014 0626   EOSABS 0.0 12/26/2019 2010   EOSABS 0.1 06/14/2019 1145   EOSABS 0.1 09/28/2014 0626   BASOSABS 0.0 12/26/2019 2010   BASOSABS 0.0 06/14/2019 1145   BASOSABS 0.1 09/28/2014 0626    CMP     Component Value Date/Time   NA 140 05/14/2020 1133   NA 142 09/28/2014 0626   K 4.2 05/14/2020 1133   K 3.9 09/28/2014 0626   CL 105 05/14/2020 1133   CL 110 (H) 09/28/2014 0626   CO2 22 05/14/2020 1133   CO2 26 09/28/2014 0626   GLUCOSE 91 05/14/2020 1133   GLUCOSE 112 (H) 04/04/2020 1905   GLUCOSE 100 (H) 09/28/2014 0626   BUN 14 05/14/2020 1133   BUN 20 (H) 09/28/2014 0626   CREATININE 0.98 05/14/2020 1133   CREATININE 1.29 (H) 07/10/2017 1645   CALCIUM 9.1 05/14/2020 1133   CALCIUM 8.9 09/28/2014 0626   PROT 6.4 05/14/2020 1133   PROT 6.7 09/27/2014 1010   ALBUMIN 4.0 05/14/2020 1133   ALBUMIN 3.2 (L) 09/27/2014 1010   AST 12 05/14/2020 1133   AST 27 09/27/2014 1010   ALT 7 05/14/2020 1133   ALT 28 09/27/2014 1010   ALKPHOS 118 05/14/2020 1133   ALKPHOS 70 09/27/2014 1010   BILITOT 0.8 05/14/2020 1133   BILITOT 1.7 (H) 09/27/2014 1010   GFRNONAA 71 05/14/2020 1133   GFRNONAA 52 (L) 07/10/2017 1645   GFRAA 82 05/14/2020 1133   GFRAA 60 07/10/2017 1645    Lipid Panel     Component Value Date/Time   CHOL 119 12/24/2019 0734   CHOL 110 10/14/2018 0813   TRIG 90 12/24/2019 0734   HDL 39 (L) 12/24/2019 0734   HDL 35 (L) 10/14/2018 0813   CHOLHDL 3.1 12/24/2019 0734   VLDL 18 12/24/2019 0734   LDLCALC 62 12/24/2019 0734   LDLCALC 44 10/14/2018 0813     Imaging I have reviewed the images obtained:  CT-scan of the brain-no acute changes  CTA head and neck with no emergent LVO.  Atherosclerotic disease at both carotid bifurcations with 50% stenosis of the ICA bulb on the right and no measurable stenosis on the left. 30%  stenosis of the left subclavian artery origin 30 to 50% stenosis of right and 50% stenosis of the left vertebral artery V4 segments.  30% stenosis of the supraclinoid ICA on the right.  30 to 50% stenosis of the supraclinoid ICA on the left.  Emphysema and aortic atherosclerosis are seen.  Assessment: 87 year old man with history of Lewy body dementia with parkinsonism and progressive cognitive, memory and gait difficulty with somewhat of an acute worsening in gait and slurred speech this morning. On my examination, he did not have vertical drift in any of the 4 extremities although the right lower extremity did feel subtly weak compared to the left.  His speech was definitely dysarthric, but going by his prior charts, he has had that before-subjectively to the family and caregiver the speech was probably mildly more dysarthric than baseline. He is on Eliquis for his paroxysmal atrial fibrillation, and took his last dose this morning hence not a candidate for TPA. I also do not suspect that he is having a lacunar or brainstem infarct but rather is symptomatic from orthostatic hypotension given multiple vascular stenoses in the head and neck, leading to cerebral hypoperfusion Orthostatic hypotension has been  an issue for him for a while-the family reports strong blood pressure fluctuations in different positions.  Recommendations: Orthostatic vitals Urinalysis CBC CMP MRI brain without contrast stat If the MRI brain shows a stroke, I would recommend admission for stroke risk factor work-up but if the MRI of the brain is negative, I would give him some fluids, and maybe consider starting him on fludrocortisone for orthostatic hypotension but patients with neurodegenerative process such as Parkinson's and Lewy body dementia do have a very difficult to treat autonomic instability especially orthostatic hypotension and would require continuing outpatient care. Maintaining adequate hydration, compression  stockings, increasing salt intake would help with the symptoms of orthostatic hypotension as well.  Patient currently lives at home with son caring for him 4 days out of 7 and 24/7 caregivers.  Given his progression and the natural course of the disease, I would imagine his care needs will increase and family should make decisions on living - assisted/memory care living.  Discussed initial plan with Dr. Quentin Cornwall. Will follow after labs and imaging  -- Amie Portland, MD Neurologist [facility] Pager: [phone]  Addendum MRI brain negative for acute stroke Also orthostatics negative although patient has had pretty wide fluctuations in his systolic pressure while he has been here. At this time, I would recommend compression stockings as well as adequate hydration. I would defer decisions on treatment of orthostatic hypotension and further evaluation of hypotension as an outpatient if it persists. Unfortunately I fear that his current presentation is probably part of a waxing waning course that is seen in advanced neurodegeneration and not many treatment modalities are available other than supportive care at that time. I have relayed my plan to Dr. Jari Pigg in the ER. I do not see any further need for neurological work-up inpatient.  -- Amie Portland, MD Neurologist [facility] Pager: [phone]

## 2020-09-27 NOTE — Progress Notes (Addendum)
CODE STROKE- PHARMACY COMMUNICATION   Time CODE STROKE called/page received:  1021  Time response to CODE STROKE was made (in person or via phone): 1031  Time Stroke Kit retrieved from [place] (only if needed):Pulled and returned  Name of Provider/Nurse contacted:Dr Rory Percy - no tpa pt on Eliquis last dose this am  Past Medical History:  Diagnosis Date  . AAA (abdominal aortic aneurysm) ([place]) 05/2019   Relativley Rapid progression from June-Aug 2020 (4.6 - 5.4 cm): s/p EVAR (Dr. Lucky Cowboy [facility]): EVAR - 23 mm prox, 12 cm distal & x 18 cm length Gore Excluder Endoprosthesis Main body (via RFA distal to lowest Renal A); 11m x 14 cm L Contralateral Limb for L Iliac - extended with 8 mm x 6cm LifeStar stent (post-dilated with 7 mm DEB).  Additional R Iliac PTA not required.   . 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 09/15/2017   09-15-2017 DIAGNOSIS:  A. COLON POLYP X 2, ASCENDING; COLD SNARE:  - TUBULAR ADENOMA.  - NEGATIVE FOR HIGH-GRADE DYSPLASIA AND MALIGNANCY.  - NO ADDITIONAL TISSUE IDENTIFIED; FECAL MATERIAL PRESENT.   B. COLON POLYP X 2, DESCENDING; COLD SNARE:  - ADENOCARCINOMA ARISING IN A DYSPLASTIC SESSILE SERRATED ADENOMA, WITH  LYMPHOVASCULAR INVASION, SEE COMMENT.  - TUBULAR ADENOMA, 2 FRAGMENTS, NEGATIVE FO  . Arthritis   . Basal cell carcinoma of eyelid 01/03/2013   2019 - R side of Nose  . Benign neoplasm of ascending colon   . Benign neoplasm of descending colon   . Bilateral iliac artery stenosis ([place] 2007   Bilateral Iliac A stenting; extension of EVAR limbs into both Iliacs -- additional stent placed in L Iliac.  . Cancer ([place]    Skin cancer  . Colon polyps   . Complication of anesthesia    severe confusion agitation requiring hospital admission x 4 days  . Compression fracture of fourth lumbar vertebra ([place] 04/2019   - s/p Kyphoplasty; T12, L3-5 ([facility]  . COPD (chronic obstructive pulmonary disease) (HCC)    Centrilobular Emphysema  . Coronary artery disease involving  native coronary artery without angina pectoris 1998   - s/p CABG, occluded SVG-D1; patent LIMA-LAD, SVG-OM, SVG- RCA  . Dementia ([place]    memory loss - started noting late 2019  . Falls frequently    broke foot  . Fracture 2021   left foot  . GERD (gastroesophageal reflux disease)   . Hemorrhoid 02/10/2015  . History of hiatal hernia   . History of kidney stones   . HOH (hard of hearing)   . Hypertension   . [place]y body dementia ([place] 04/04/2020  . Macular degeneration disease   . Osteoporosis   . PAF (paroxysmal atrial fibrillation) ([place]    on Eliquis for OAC - Rate Control   . Prostate cancer ([place]   . Prostate disorder   . Sciatic pain    Chronic  . Sleep apnea    cpap  . Temporary cerebral vascular dysfunction 02/10/2015   Had negative work-up.  July, 2012.  No medication changes, had forgot them that day, got dehydrated.   . Urinary incontinence    Prior to Admission medications   Medication Sig Start Date End Date Taking? Authorizing Provider  acetaminophen (TYLENOL) 500 MG tablet Take 500-1,000 mg by mouth every 6 (six) hours as needed for mild pain or moderate pain.     [provider]  Ascorbic Acid (VITAMIN C) 1000 MG tablet Take 1,000 mg by mouth daily.    [provider]  Cyanocobalamin (VITAMIN B-12) 500 MCG SUBL Place 500 mcg under the tongue daily.     [provider]  ELIQUIS 5 MG TABS tablet TAKE ONE TABLET TWICE DAILY 09/03/20   Leonie Man, MD  ezetimibe (ZETIA) 10 MG tablet TAKE 1 TABLET BY MOUTH DAILY 07/05/20   [name], MD  finasteride (PROSCAR) 5 MG tablet TAKE ONE TABLET BY MOUTH EVERY DAY 01/02/20   Stoioff, Ronda Fairly, MD  fluticasone (FLONASE) 50 MCG/ACT nasal spray PLACE TWO SPRAYS IN BOTH NOSRTILS EVERY DAY AS NEEDED FOR ALLERGIES 08/07/20   [name], MD  lisinopril (ZESTRIL) 2.5 MG tablet Take 1 tablet (2.5 mg total) by mouth daily. 01/02/20 08/01/20  Leonie Man, MD  Magnesium Oxide 500 MG TABS Take 500 mg by  mouth every evening.    [provider]  melatonin 3 MG TABS tablet Take 0.5 tablets (1.5 mg total) by mouth at bedtime. 05/28/20   [name], MD  metoprolol succinate (TOPROL-XL) 50 MG 24 hr tablet TAKE ONE TABLET BY MOUTH EVERY DAY TAKE WITH OR IMMEDIATELY FOLLOWING A MEAL 08/28/20   [name], MD  Multiple Vitamins-Minerals (PRESERVISION AREDS 2 PO) Take 1 tablet by mouth 2 (two) times daily.     [provider]  MYRBETRIQ 50 MG TB24 tablet TAKE ONE TABLET BY MOUTH EVERY DAY 01/02/20   Stoioff, Ronda Fairly, MD  nitroGLYCERIN (NITROSTAT) 0.4 MG SL tablet Place 1 tablet (0.4 mg total) under the tongue every 5 (five) minutes as needed for chest pain. 01/12/17   [name], MD  pantoprazole (PROTONIX) 40 MG tablet TAKE ONE TABLET BY MOUTH EVERY DAY 05/03/20   [name], MD  QUEtiapine (SEROQUEL) 25 MG tablet Take 12.5 mg by mouth.     [provider]  rosuvastatin (CRESTOR) 20 MG tablet TAKE ONE TABLET BY MOUTH EVERY DAY 10/01/19   [name], MD  umeclidinium-vilanterol (ANORO ELLIPTA) 62.5-25 MCG/INH AEPB Inhale 1 puff into the lungs daily.  10/25/19   [provider]    Lu Duffel ,PharmD Clinical Pharmacist  09/27/2020  10:37 AM

## 2020-09-27 NOTE — ED Notes (Signed)
Pt pulled out IV, pt states he is sorry, he is just ready to go home.

## 2020-09-27 NOTE — ED Provider Notes (Signed)
[facility] Emergency Department Provider Note  ____________________________________________   Event Date/Time   First MD Initiated Contact with Patient 09/27/20 1018     (approximate)  I have reviewed the triage vital signs and the nursing notes.   HISTORY  Chief Complaint Code Stroke    HPI [name] is a 87 y.o. male with AAA COPD, dementia who comes in for concern for right-sided weakness.  According to care given pt was normal at [facility] he got there. Around 845AM he noted pt to have weakness mostly on the right side.  They attempted to get him to physical therapy and they noticed that the weakness was worsening and he is having difficulty walking due to increasingly weakness on the right leg.  They also noticed slurred speech although patient stated that his speech was baseline for him.  Symptoms have been constant, moderate, nothing makes it better, nothing makes it worse.  No chest pain, no shortness of breath          Past Medical History:  Diagnosis Date  . AAA (abdominal aortic aneurysm) ([place]) 05/2019   Relativley Rapid progression from June-Aug 2020 (4.6 - 5.4 cm): s/p EVAR (Dr. [facility]): EVAR - 23 mm prox, 12 cm distal & x 18 cm length Gore Excluder Endoprosthesis Main body (via RFA distal to lowest Renal A); 14m x 14 cm L Contralateral Limb for L Iliac - extended with 8 mm x 6cm LifeStar stent (post-dilated with 7 mm DEB).  Additional R Iliac PTA not required.   . 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 ([place] 09/15/2017   09-15-2017 DIAGNOSIS:  A. COLON POLYP X 2, ASCENDING; COLD SNARE:  - TUBULAR ADENOMA.  - NEGATIVE FOR HIGH-GRADE DYSPLASIA AND MALIGNANCY.  - NO ADDITIONAL TISSUE IDENTIFIED; FECAL MATERIAL PRESENT.   B. COLON POLYP X 2, DESCENDING; COLD SNARE:  - ADENOCARCINOMA ARISING IN A DYSPLASTIC SESSILE SERRATED ADENOMA, WITH  LYMPHOVASCULAR INVASION, SEE COMMENT.  - TUBULAR ADENOMA, 2 FRAGMENTS, NEGATIVE FO  . Arthritis   . Basal  cell carcinoma of eyelid 01/03/2013   2019 - R side of Nose  . Benign neoplasm of ascending colon   . Benign neoplasm of descending colon   . Bilateral iliac artery stenosis [place]   Bilateral Iliac A stenting; extension of EVAR limbs into both Iliacs -- additional stent placed in L Iliac.  . Cancer ([place]    Skin cancer  . Colon polyps   . Complication of anesthesia    severe confusion agitation requiring hospital admission x 4 days  . Compression fracture of fourth lumbar vertebra ([place] 04/2019   - s/p Kyphoplasty; T12, L3-5 ([facility]  . COPD (chronic obstructive pulmonary disease) (HCC)    Centrilobular Emphysema  . Coronary artery disease involving native coronary artery without angina pectoris 1998   - s/p CABG, occluded SVG-D1; patent LIMA-LAD, SVG-OM, SVG- RCA  . Dementia ([place]    memory loss - started noting late 2019  . Falls frequently    broke foot  . Fracture 2021   left foot  . GERD (gastroesophageal reflux disease)   . Hemorrhoid 02/10/2015  . History of hiatal hernia   . History of kidney stones   . HOH (hard of hearing)   . Hypertension   . Lewy body dementia ([place] 04/04/2020  . Macular degeneration disease   . Osteoporosis   . PAF (paroxysmal atrial fibrillation) ([place]    on Eliquis for OAC - Rate Control   . Prostate  cancer ([place])   . Prostate disorder   . Sciatic pain    Chronic  . Sleep apnea    cpap  . Temporary cerebral vascular dysfunction 02/10/2015   Had negative work-up.  July, 2012.  No medication changes, had forgot them that day, got dehydrated.   . Urinary incontinence     Patient Active Problem List   Diagnosis Date Noted  . AAA (abdominal aortic aneurysm) without rupture ([place]) 06/29/2020  . LBD (Lewy body dementia) ([place]) 05/14/2020  . Numbness and tingling in left hand 03/02/2020  . Hematuria   . Acute kidney injury superimposed on CKD ([place])   . Sepsis due to gram-negative UTI ([place]) 12/26/2019  . Left nephrolithiasis 12/26/2019  .  Hydronephrosis, left 12/26/2019  . Status post abdominal aortic aneurysm (AAA) repair 04/15/2019  . Do not intubate, cardiopulmonary resuscitation (CPR)-only code status 10/08/2018  . Prostate cancer ([place]) 02/25/2018  . Prostatic cyst 01/15/2018  . Blood in stool   . Abnormal feces   . Stricture and stenosis of esophagus   . Mild cognitive impairment 08/18/2017  . Senile purpura ([place]) 04/29/2017  . Anemia 04/29/2017  . Frequent falls 04/29/2017  . Simple chronic bronchitis ([place]) 12/17/2016  . Benign localized hyperplasia of prostate with urinary obstruction 07/15/2016  . Elevated PSA 07/15/2016  . Incomplete emptying of bladder 07/15/2016  . Urge incontinence 07/15/2016  . Hypothyroidism 04/25/2016  . Macular degeneration 04/25/2016  . Erectile dysfunction due to arterial insufficiency   . Ischemic heart disease with chronotropic incompetence   . CN (constipation) 02/10/2015  . DD (diverticular disease) 02/10/2015  . Weakness 02/10/2015  . Lichen planus 80/99/8338  . Restless leg 02/10/2015  . 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 B12 deficiency 02/10/2015  . COPD (chronic obstructive pulmonary disease) ([place]) 11/14/2014  . Post Inflammatory Lung Changes 11/14/2014  . PAF (paroxysmal atrial fibrillation) (HCC) CHA2DS2-VASc = 4. AC = Eliquis   . Insomnia 12/09/2013  . OSA on CPAP   . Dyspnea on exertion - -essentially resolved with BB dose reduction & wgt loss 03/29/2013  . Overweight (BMI 25.0-29.9) -- Wgt back up 01/17/2013  . Hx of CABG   . Essential hypertension   . Hyperlipidemia with target LDL less than 70   . Aorto-iliac disease ([place])   . BCC (basal cell carcinoma), eyelid 01/03/2013  . Allergic rhinitis 08/17/2009  . Adaptation reaction 05/28/2009  . Acid reflux 05/28/2009  . Arthritis, degenerative 05/28/2009  . 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 Carotid artery occlusion without infarction 06/01/2006  . PAD (peripheral artery disease) - bilateral  common iliac stents 12/15/2005  . Atherosclerotic heart disease of artery bypass graft 09/01/1997    Past Surgical History:  Procedure Laterality Date  . ABDOMINAL AORTIC ENDOVASCULAR STENT GRAFT Bilateral 05/18/2019   Procedure: ABDOMINAL AORTIC ENDOVASCULAR STENT GRAFT;  Surgeon: Algernon Huxley, MD;  Location: [facility] ORS;  Service: Vascular;  Laterality: Bilateral;  . ANKLE SURGERY Left    ORIF   . Arch aortogram and carotid aortogram  06/02/2006   Dr Oneida Alar did surgery  . BASAL CELL CARCINOMA EXCISION  04/2016   Dermatology  . CARDIAC CATHETERIZATION  01/30/1998    (Dr. Linard Millers): Native LAD & mRCA CTO. 90% D1. Mod Cx. SVG-D1 CTO. SVG-RCA & SVG-OM along with LIMA-LAD patent;  LV NORMAL Fxn  . CAROTID ENDARTERECTOMY Left Oct. 29, 2007   Dr. Oneida Alar:  . CATARACT EXTRACTION W/PHACO Right 03/05/2017   Procedure: CATARACT EXTRACTION PHACO AND INTRAOCULAR LENS  PLACEMENT (IOC);  Surgeon: Leandrew Koyanagi, MD;  Location: [facility] ORS;  Service: Ophthalmology;  Laterality: Right;  [place] 00:58.5 AP% 10.6 CDE 6.20 Fluid Pack lot # [id number]  . CATARACT EXTRACTION W/PHACO Left 04/15/2017   Procedure: CATARACT EXTRACTION PHACO AND INTRAOCULAR LENS PLACEMENT ([place])  Left;  Surgeon: Leandrew Koyanagi, MD;  Location: [place];  Service: Ophthalmology;  Laterality: Left;  . COLONOSCOPY WITH PROPOFOL N/A 09/15/2017   Procedure: COLONOSCOPY WITH PROPOFOL;  Surgeon: Lucilla Lame, MD;  Location: [facility] ENDOSCOPY;  Service: Endoscopy;  Laterality: N/A;  . COLONOSCOPY WITH PROPOFOL N/A 10/26/2018   Procedure: COLONOSCOPY WITH PROPOFOL;  Surgeon: Lucilla Lame, MD;  Location: [facility] ENDOSCOPY;  Service: Endoscopy;  Laterality: N/A;  . CORONARY ARTERY BYPASS GRAFT  1998   LIMA-LAD, SVG-OM, SVG-RPDA, SVG-DIAG  . CPET / MET  12/2012   Mild chronotropic incompetence - read 82% of predicted; also reduced effort; peak VO2 15.7 / 75% (did not reach Max effort) -- suggested ischemic response in last 1.5 minutes of  exercise. Normal pulmonary function on PFTs but poor response to  . CYSTOSCOPY W/ RETROGRADES Left 01/13/2020   Procedure: CYSTOSCOPY WITH RETROGRADE PYELOGRAM;  Surgeon: Billey Co, MD;  Location: [facility] ORS;  Service: Urology;  Laterality: Left;  . CYSTOSCOPY WITH STENT PLACEMENT Left 12/26/2019   Procedure: CYSTOSCOPY WITH STENT PLACEMENT;  Surgeon: Billey Co, MD;  Location: [facility] ORS;  Service: Urology;  Laterality: Left;  . CYSTOSCOPY/URETEROSCOPY/HOLMIUM LASER/STENT PLACEMENT Left 01/13/2020   Procedure: CYSTOSCOPY/URETEROSCOPY/HOLMIUM LASER/STENT EXCHANGE;  Surgeon: Billey Co, MD;  Location: [facility] ORS;  Service: Urology;  Laterality: Left;  . ENDOVASCULAR REPAIR/STENT GRAFT N/A 05/18/2019   Procedure: ENDOVASCULAR REPAIR/STENT GRAFT;  Surgeon: Algernon Huxley, MD;  Location: [facility] INVASIVE CV LAB;; EVAR - 23 mm prox, 12 cm distal & x 18 cm length Gore Excluder Endoprosthesis Main body (via RFA distal to lowest Renal A); 64m x 14 cm L Contralateral Limb for L Iliac - extended with 8 mm x 6cm LifeStar stent; no additional R Iliac PTA needed.   . ESOPHAGOGASTRODUODENOSCOPY (EGD) WITH PROPOFOL N/A 09/15/2017   Procedure: ESOPHAGOGASTRODUODENOSCOPY (EGD) WITH PROPOFOL;  Surgeon: WLucilla Lame MD;  [facility];  Service: Endoscopy;  Laterality: N/A;  . FRACTURE SURGERY Right 03/19/2015   wrist  . FRACTURE SURGERY Left   . HIATAL HERNIA REPAIR    . ILIAC ARTERY STENT  12/15/2005   PTA and direct stenting rgt and lft common iliac arteries  . KYPHOPLASTY N/A 04/05/2019   Procedure: KYPHOPLASTY T12, L3, L4 L5;  Surgeon: MHessie Knows MD;  Location: [facility] ORS;  Service: Orthopedics;  Laterality: N/A;  . NM GATED MYOVIEW ([place])  05/'19; 8/'20   a) [facility]: Low Risk - no ischemia or infarct.  EF > 65%. no RWMA;; b) ([facility]: Normal wall motion.  No ischemia or infarction.  . OPEN REDUCTION INTERNAL FIXATION (ORIF) DISTAL RADIAL FRACTURE Left 01/13/2019   Procedure: OPEN  REDUCTION INTERNAL FIXATION (ORIF) DISTAL  RADIAL FRACTURE - LEFT - SLEEP APNEA;  Surgeon: MHessie Knows MD;  Location: [facility] ORS;  Service: Orthopedics;  Laterality: Left;  . ORIF ELBOW FRACTURE Right 01/01/2017   Procedure: OPEN REDUCTION INTERNAL FIXATION (ORIF) ELBOW/OLECRANON FRACTURE;  Surgeon: MHessie Knows MD;  Location: [facility] ORS;  Service: Orthopedics;  Laterality: Right;  . ORIF WRIST FRACTURE Right 03/19/2015   Procedure: OPEN REDUCTION INTERNAL FIXATION (ORIF) WRIST FRACTURE;  Surgeon: MHessie Knows MD;  Location: [facility] ORS;  Service: Orthopedics;  Laterality: Right;  . SHOULDER  ARTHROSCOPY Right   . THORACIC AORTA - CAROTID ANGIOGRAM  October 2007   Dr. 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 TRANSTHORACIC ECHOCARDIOGRAM  February 2016   [facility]: Normal LV function. Dilated left atrium.  Domingo Dimes ECHOCARDIOGRAM  04/2019   [facility] April 20, 2019): EF 55-60 %.  Mild LVH.  Relatively normal valves.    Prior to Admission medications   Medication Sig Start Date End Date Taking? Authorizing Provider  acetaminophen (TYLENOL) 500 MG tablet Take 500-1,000 mg by mouth every 6 (six) hours as needed for mild pain or moderate pain.     [provider]  Ascorbic Acid (VITAMIN C) 1000 MG tablet Take 1,000 mg by mouth daily.    [provider]  Cyanocobalamin (VITAMIN B-12) 500 MCG SUBL Place 500 mcg under the tongue daily.     [provider]  ELIQUIS 5 MG TABS tablet TAKE ONE TABLET TWICE DAILY 09/03/20   Leonie Man, MD  ezetimibe (ZETIA) 10 MG tablet TAKE 1 TABLET BY MOUTH DAILY 07/05/20   [name], MD  finasteride (PROSCAR) 5 MG tablet TAKE ONE TABLET BY MOUTH EVERY DAY 01/02/20   Stoioff, Ronda Fairly, MD  fluticasone (FLONASE) 50 MCG/ACT nasal spray PLACE TWO SPRAYS IN BOTH NOSRTILS EVERY DAY AS NEEDED FOR ALLERGIES 08/07/20   [name], MD  lisinopril  (ZESTRIL) 2.5 MG tablet Take 1 tablet (2.5 mg total) by mouth daily. 01/02/20 08/01/20  Leonie Man, MD  Magnesium Oxide 500 MG TABS Take 500 mg by mouth every evening.    [provider]  melatonin 3 MG TABS tablet Take 0.5 tablets (1.5 mg total) by mouth at bedtime. 05/28/20   [name], MD  metoprolol succinate (TOPROL-XL) 50 MG 24 hr tablet TAKE ONE TABLET BY MOUTH EVERY DAY TAKE WITH OR IMMEDIATELY FOLLOWING A MEAL 08/28/20   [name], MD  Multiple Vitamins-Minerals (PRESERVISION AREDS 2 PO) Take 1 tablet by mouth 2 (two) times daily.     [provider]  MYRBETRIQ 50 MG TB24 tablet TAKE ONE TABLET BY MOUTH EVERY DAY 01/02/20   Stoioff, Ronda Fairly, MD  nitroGLYCERIN (NITROSTAT) 0.4 MG SL tablet Place 1 tablet (0.4 mg total) under the tongue every 5 (five) minutes as needed for chest pain. 01/12/17   [name], MD  pantoprazole (PROTONIX) 40 MG tablet TAKE ONE TABLET BY MOUTH EVERY DAY 05/03/20   [name], MD  QUEtiapine (SEROQUEL) 25 MG tablet Take 12.5 mg by mouth.     [provider]  rosuvastatin (CRESTOR) 20 MG tablet TAKE ONE TABLET BY MOUTH EVERY DAY 10/01/19   [name], MD  umeclidinium-vilanterol (ANORO ELLIPTA) 62.5-25 MCG/INH AEPB Inhale 1 puff into the lungs daily.  10/25/19   [provider]    Allergies Clonazepam, Codeine, Ropinirole, Hydrocodone, Niacin and related, Oxycodone, and Tramadol  Family History  Problem Relation Age of Onset  . Ulcers Mother        Peptic  . Dementia Mother   . Alcohol abuse Father     Social History Social History   Tobacco Use  . Smoking status: Former Smoker    Types: Cigarettes    Quit date: 08/04/1997    Years since quitting: 23.1  . Smokeless tobacco: Never Used  Vaping Use  . Vaping Use: Never used  Substance Use Topics  . Alcohol use: No  .  Drug use: No      Review of Systems Constitutional: No fever/chills Eyes: No visual changes. ENT: No sore throat.  Changes in speech Cardiovascular: Denies chest pain. Respiratory: Denies shortness of breath. Gastrointestinal: No abdominal pain.  No nausea, no vomiting.  No diarrhea.  No constipation. Genitourinary: Negative for dysuria. Musculoskeletal: Negative for back pain. Right leg weakness Skin: Negative for rash. Neurological: Right-sided weakness, slurred speech All other ROS negative ____________________________________________   PHYSICAL EXAM:  VITAL SIGNS: Today's Vitals   09/27/20 1200 09/27/20 1215 09/27/20 1240 09/27/20 1320  BP: 116/73  (!) 113/98 (!) 122/92  Pulse: 67 66 67 61  Resp: _0 Temp:      TempSrc:      SpO2: 100% 99% 100% 100%  Weight:      Height:      PainSc:       There is no height or weight on file to calculate BMI.   Constitutional: Alert and oriented. Well appearing and in no acute distress. Eyes: Conjunctivae are normal. EOMI. Head: Atraumatic. Nose: No congestion/rhinnorhea. Mouth/Throat: Mucous membranes are moist.   Neck: No stridor. Trachea Midline. FROM Cardiovascular: Normal rate, regular rhythm. Grossly normal heart sounds.  Good peripheral circulation. Respiratory: Normal respiratory effort.  No retractions. Lungs CTAB. Gastrointestinal: Soft and nontender. No distention. No abdominal bruits.  Musculoskeletal: No lower extremity tenderness nor edema.  No joint effusions. Slight weakness in the right leg but very hard to appreciate. Neurologic:  Normal speech and language. No gross focal neurologic deficits are appreciated.  Skin:  Skin is warm, dry and intact. No rash noted. Psychiatric: Mood and affect are normal. Patient states that his speech is normal the caregiver says it seems slightly off GU: Deferred   ____________________________________________   LABS (all labs ordered are listed, but only abnormal results are displayed)  Labs Reviewed  PROTIME-INR - Abnormal; Notable for the following components:      Result Value    Prothrombin Time 17.3 (*)    INR 1.5 (*)    All other components within normal limits  APTT - Abnormal; Notable for the following components:   aPTT 39 (*)    All other components within normal limits  CBC - Abnormal; Notable for the following components:   RBC 3.66 (*)    Hemoglobin 11.4 (*)    HCT 34.4 (*)    Platelets 118 (*)    All other components within normal limits  COMPREHENSIVE METABOLIC PANEL - Abnormal; Notable for the following components:   Glucose, Bld 64 (*)    Total Protein 6.3 (*)    All other components within normal limits  URINALYSIS, COMPLETE (UACMP) WITH MICROSCOPIC - Abnormal; Notable for the following components:   Color, Urine YELLOW (*)    APPearance CLEAR (*)    Specific Gravity, Urine >1.046 (*)    All other components within normal limits  SARS CORONAVIRUS 2 BY RT PCR (HOSPITAL ORDER, [place] [facility])  DIFFERENTIAL  CBG MONITORING, ED  I-STAT CREATININE, ED   ____________________________________________   ED ECG REPORT I, Vanessa Pickrell, the attending physician, personally viewed and interpreted this ECG.  Normal sinus rate 66, no ST elevation, T wave inversion in V1 through V3, normal intervals. Reviewed prior EKGs he has had similar T wave inversions previously ____________________________________________  RADIOLOGY   Official radiology report(s): CT Code Stroke CTA Head W/WO contrast  Result Date: 09/27/2020 CLINICAL DATA:  Right leg weakness.  Slurred speech.  EXAM: CT ANGIOGRAPHY HEAD AND NECK TECHNIQUE: Multidetector CT imaging of the head and neck was performed using the standard protocol during bolus administration of intravenous contrast. Multiplanar CT image reconstructions and MIPs were obtained to evaluate the vascular anatomy. Carotid stenosis measurements (when applicable) are obtained utilizing NASCET criteria, using the distal internal carotid diameter as the denominator. CONTRAST:  78m OMNIPAQUE IOHEXOL 350  MG/ML SOLN COMPARISON:  Head CT earlier same day FINDINGS: CTA NECK FINDINGS Aortic arch: Aortic atherosclerotic calcification. Branching pattern is normal without flow limiting origin stenosis. Approximately 30% stenosis of the left subclavian artery origin. Right carotid system: Common carotid artery widely patent to the bifurcation region. Calcified plaque at the carotid bifurcation. Soft and calcified plaque in the ICA bulb. Minimal diameter in the ICA bulb 2.5 mm. Compared to a more distal cervical ICA diameter of 5 mm, this indicates a 50% stenosis. Left carotid system: Common carotid artery shows some scattered plaque but is widely patent to the bifurcation. Calcified plaque at the carotid bifurcation and ICA bulb. Minimal diameter of the ICA bulb is 4.5 mm, the same as the more distal cervical ICA, therefore no stenosis. Vertebral arteries: Both vertebral artery origins are widely patent. There is calcified plaque of the left subclavian artery just proximal to the vertebral artery origin with narrowing of about 30% in that segment. Both vertebral arteries appear widely patent through the cervical region to the foramen magnum. Skeleton: Ordinary cervical spondylosis. Other neck: No mass or lymphadenopathy. Upper chest: Emphysema and pulmonary scarring. Dense calcified granuloma in the right upper lobe. Review of the MIP images confirms the above findings CTA HEAD FINDINGS Anterior circulation: Both internal carotid arteries are patent through the skull base and siphon regions. There is siphon atherosclerotic calcification. On the right, there is 30% stenosis of the supraclinoid ICA. On the left, there is 30-50% stenosis of the supraclinoid ICA. The anterior and middle cerebral vessels are patent without large or medium vessel occlusion. Both anterior cerebral arteries receive there supply from the left carotid circulation. Posterior circulation: Both vertebral arteries are patent through the foramen magnum.  There is atherosclerotic disease affecting both vertebral artery V4 segments. Stenosis estimated at 30-50% on the right and 50% on the left. Both vessels do reach the basilar. No basilar stenosis. Posterior circulation branch vessels show flow. Primary fetal origin of the right PCA. Venous sinuses: Patent and normal. Anatomic variants: None significant. Review of the MIP images confirms the above findings IMPRESSION: 1. No acute large or medium vessel occlusion. 2. Atherosclerotic disease at both carotid bifurcations. 50% stenosis of the ICA bulb on the right. No measurable stenosis on the left. 3. 30% stenosis of the left subclavian artery origin. 4. 30-50% stenosis of the right and 50% stenosis of the left vertebral artery V4 segments. 5. 30% stenosis of the supraclinoid ICA on the right. 30-50% stenosis of the supraclinoid ICA on the left. 6. Emphysema and aortic atherosclerosis. 7. These results were called by telephone at the time of interpretation on 09/27/2020 at 10:52 am [facility], who verbally acknowledged these results. Aortic Atherosclerosis (ICD10-I70.0) and Emphysema (ICD10-J43.9). Electronically Signed   By: MNelson ChimesM.D.   On: 09/27/2020 10:57   CT Code Stroke CTA Neck W/WO contrast  Result Date: 09/27/2020 CLINICAL DATA:  Right leg weakness.  Slurred speech. EXAM: CT ANGIOGRAPHY HEAD AND NECK TECHNIQUE: Multidetector CT imaging of the head and neck was performed using the standard protocol during bolus administration of intravenous contrast. Multiplanar  CT image reconstructions and MIPs were obtained to evaluate the vascular anatomy. Carotid stenosis measurements (when applicable) are obtained utilizing NASCET criteria, using the distal internal carotid diameter as the denominator. CONTRAST:  3m OMNIPAQUE IOHEXOL 350 MG/ML SOLN COMPARISON:  Head CT earlier same day FINDINGS: CTA NECK FINDINGS Aortic arch: Aortic atherosclerotic calcification. Branching pattern is normal  without flow limiting origin stenosis. Approximately 30% stenosis of the left subclavian artery origin. Right carotid system: Common carotid artery widely patent to the bifurcation region. Calcified plaque at the carotid bifurcation. Soft and calcified plaque in the ICA bulb. Minimal diameter in the ICA bulb 2.5 mm. Compared to a more distal cervical ICA diameter of 5 mm, this indicates a 50% stenosis. Left carotid system: Common carotid artery shows some scattered plaque but is widely patent to the bifurcation. Calcified plaque at the carotid bifurcation and ICA bulb. Minimal diameter of the ICA bulb is 4.5 mm, the same as the more distal cervical ICA, therefore no stenosis. Vertebral arteries: Both vertebral artery origins are widely patent. There is calcified plaque of the left subclavian artery just proximal to the vertebral artery origin with narrowing of about 30% in that segment. Both vertebral arteries appear widely patent through the cervical region to the foramen magnum. Skeleton: Ordinary cervical spondylosis. Other neck: No mass or lymphadenopathy. Upper chest: Emphysema and pulmonary scarring. Dense calcified granuloma in the right upper lobe. Review of the MIP images confirms the above findings CTA HEAD FINDINGS Anterior circulation: Both internal carotid arteries are patent through the skull base and siphon regions. There is siphon atherosclerotic calcification. On the right, there is 30% stenosis of the supraclinoid ICA. On the left, there is 30-50% stenosis of the supraclinoid ICA. The anterior and middle cerebral vessels are patent without large or medium vessel occlusion. Both anterior cerebral arteries receive there supply from the left carotid circulation. Posterior circulation: Both vertebral arteries are patent through the foramen magnum. There is atherosclerotic disease affecting both vertebral artery V4 segments. Stenosis estimated at 30-50% on the right and 50% on the left. Both vessels do  reach the basilar. No basilar stenosis. Posterior circulation branch vessels show flow. Primary fetal origin of the right PCA. Venous sinuses: Patent and normal. Anatomic variants: None significant. Review of the MIP images confirms the above findings IMPRESSION: 1. No acute large or medium vessel occlusion. 2. Atherosclerotic disease at both carotid bifurcations. 50% stenosis of the ICA bulb on the right. No measurable stenosis on the left. 3. 30% stenosis of the left subclavian artery origin. 4. 30-50% stenosis of the right and 50% stenosis of the left vertebral artery V4 segments. 5. 30% stenosis of the supraclinoid ICA on the right. 30-50% stenosis of the supraclinoid ICA on the left. 6. Emphysema and aortic atherosclerosis. 7. These results were called by telephone at the time of interpretation on 09/27/2020 at 10:52 [facility] these results. Aortic Atherosclerosis (ICD10-I70.0) and Emphysema (ICD10-J43.9). Electronically Signed   By: MNelson ChimesM.D.   On: 09/27/2020 10:57   MR BRAIN WO CONTRAST  Result Date: 09/27/2020 CLINICAL DATA:  Neuro deficit, acute, stroke suspected EXAM: MRI HEAD WITHOUT CONTRAST TECHNIQUE: Multiplanar, multiecho pulse sequences of the brain and surrounding structures were obtained without intravenous contrast. COMPARISON:  09/27/2020 and prior. FINDINGS: Brain: No diffusion-weighted signal abnormality. Remote left parietal microhemorrhage. Chronic lacunar insults involving the right caudate and thalamus. No midline shift, ventriculomegaly or extra-axial fluid collection. No mass lesion. Mild cerebral atrophy with  ex vacuo dilatation. Advanced chronic microvascular ischemic changes. Vascular: Please see recent CTA. Skull and upper cervical spine: Normal marrow signal. Sinuses/Orbits: Sequela of bilateral lens replacement. Pneumatized paranasal sinuses. Trace right mastoid free fluid. Other: Please note that image quality is mildly  motion degraded. IMPRESSION: No acute intracranial process. Chronic right caudate and thalamus lacunar insults. Mild cerebral atrophy. Advanced chronic microvascular ischemic changes. Electronically Signed   By: Primitivo Gauze M.D.   On: 09/27/2020 14:03   CT HEAD CODE STROKE WO CONTRAST  Result Date: 09/27/2020 CLINICAL DATA:  Code stroke.  Right leg weakness and slurred speech EXAM: CT HEAD WITHOUT CONTRAST TECHNIQUE: Contiguous axial images were obtained from the base of the skull through the vertex without intravenous contrast. COMPARISON:  MRI 04/17/2020 FINDINGS: Brain: Generalized atrophy. Chronic small-vessel ischemic changes throughout the cerebral hemispheric white matter. 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 No sign of acute infarction, mass lesion, hemorrhage, hydrocephalus or extra-axial collection. Vascular: There is atherosclerotic calcification of the major vessels at the base of the brain. Skull: Negative Sinuses/Orbits: Clear/normal Other: None ASPECTS ([place] Stroke Program Early CT Score) - Ganglionic level infarction (caudate, lentiform nuclei, internal capsule, insula, M1-M3 cortex): 7 - Supraganglionic infarction (M4-M6 cortex): 3 Total score (0-10 with 10 being normal): 10 IMPRESSION: 1. No acute finding by CT. Atrophy and chronic small-vessel ischemic changes of the white matter. 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 2. ASPECTS is 10. 3. These results were communicated to Dr. Rory Percy at 10:32 amon 1/27/2022by text page via the [facility] messaging system. Electronically Signed   By: Nelson Chimes M.D.   On: 09/27/2020 10:32    ____________________________________________   PROCEDURES  Procedure(s) performed (including Critical Care):  Procedures   ____________________________________________   INITIAL IMPRESSION / ASSESSMENT AND PLAN / ED COURSE  ULIS KAPS was evaluated in Emergency Department on 09/27/2020 for the symptoms described in  the history of present illness. He was evaluated in the context of the global COVID-19 pandemic, which necessitated consideration that the patient might be at risk for infection with the SARS-CoV-2 virus that causes COVID-19. Institutional protocols and algorithms that pertain to the evaluation of patients at risk for COVID-19 are in a state of rapid change based on information released by regulatory bodies including the CDC and federal and state organizations. These policies and algorithms were followed during the patient's care in the ED.    Patient was called out as a code stroke with last known normal of 8 AM due to some speech deficits as well as right-sided weakness.  CT head ordered to evaluate for intercranial hemorrhage.  Patient is on Eliquis therefore not TPA candidate.  CTA ordered to rule out LVO.  Concern for ischemic stroke.  Will get labs to evaluate for Electra abnormalities, AKI, anemia that could be contributing.  Labs are reassuring. CT scans were negative. Discussed with neurology who wants orthostatics. These were negative. Given this we will get MRI.   Reevaluated patient. Wife at bedside. Updated on plan. She states that the voices at his baseline self. He is not sure about his walking because he states he has not stood up and tried to walk. Updated on plan  MRI negative some chronic old infarcts but nothing to cause symptoms today. Already on blood thinner.   Discussed with Dr. Rory Percy who recommends pt discuss with pcp or neurologist about florinef for outpatient.  Patient was able to ambulate at his baseline with assistance.  He feels much better and  feels comfortable with being discharged home          ____________________________________________   FINAL CLINICAL IMPRESSION(S) / ED DIAGNOSES   Final diagnoses:  Weakness      MEDICATIONS GIVEN DURING THIS VISIT:  Medications  iohexol (OMNIPAQUE) 350 MG/ML injection 75 mL (75 mLs Intravenous Contrast Given  09/27/20 1033)     ED Discharge Orders    None       Note:  This document was prepared using Dragon voice recognition software and may include unintentional dictation errors.   Vanessa [place], MD 09/27/20 [phone]

## 2020-10-02 ENCOUNTER — Ambulatory Visit: Payer: Medicare Other | Attending: Nurse Practitioner

## 2020-10-02 ENCOUNTER — Telehealth: Payer: Self-pay

## 2020-10-02 ENCOUNTER — Other Ambulatory Visit: Payer: Self-pay | Admitting: Family Medicine

## 2020-10-02 ENCOUNTER — Other Ambulatory Visit: Payer: Self-pay

## 2020-10-02 DIAGNOSIS — R2681 Unsteadiness on feet: Secondary | ICD-10-CM

## 2020-10-02 DIAGNOSIS — R262 Difficulty in walking, not elsewhere classified: Secondary | ICD-10-CM | POA: Diagnosis present

## 2020-10-02 DIAGNOSIS — R2689 Other abnormalities of gait and mobility: Secondary | ICD-10-CM

## 2020-10-02 DIAGNOSIS — M6281 Muscle weakness (generalized): Secondary | ICD-10-CM | POA: Diagnosis present

## 2020-10-02 DIAGNOSIS — G8929 Other chronic pain: Secondary | ICD-10-CM | POA: Insufficient documentation

## 2020-10-02 DIAGNOSIS — E785 Hyperlipidemia, unspecified: Secondary | ICD-10-CM

## 2020-10-02 DIAGNOSIS — M545 Low back pain, unspecified: Secondary | ICD-10-CM | POA: Insufficient documentation

## 2020-10-02 NOTE — Therapy (Signed)
[place] MAIN [facility] SERVICES [address] [place], [place], [address] Phone: [phone]   Fax:  [phone]  Physical Therapy Treatment  Patient Details  Name: [name] MRN: [id number] Date of Birth: [birth year] Referring Provider (PT): Jennings Books K    Encounter Date: 10/02/2020   PT End of Session - 10/02/20 1256    Visit Number 19    Number of Visits 24    Date for PT Re-Evaluation 10/04/20    Authorization Type 9/10    PT Start Time 0929    PT Stop Time 1014    PT Time Calculation (min) 45 min    Equipment Utilized During Treatment Gait belt    Activity Tolerance Patient tolerated treatment well    Behavior During Therapy WFL for tasks assessed/performed           Past Medical History:  Diagnosis Date  . AAA (abdominal aortic aneurysm) ([place]) 05/2019   [place] Rapid progression from June-Aug 2020 (4.6 - 5.4 cm): s/p EVAR (Dr. Lucky Cowboy [facility]): EVAR - 23 mm prox, 12 cm distal & x 18 cm length Gore Excluder Endoprosthesis Main body (via RFA distal to lowest Renal A); 68mm x 14 cm L Contralateral Limb for L Iliac - extended with 8 mm x 6cm LifeStar stent (post-dilated with 7 mm DEB).  Additional R Iliac PTA not required.   . 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 ([place]) 09/15/2017   09-15-2017 DIAGNOSIS:  A. COLON POLYP X 2, ASCENDING; COLD SNARE:  - TUBULAR ADENOMA.  - NEGATIVE FOR HIGH-GRADE DYSPLASIA AND MALIGNANCY.  - NO ADDITIONAL TISSUE IDENTIFIED; FECAL MATERIAL PRESENT.   B. COLON POLYP X 2, DESCENDING; COLD SNARE:  - ADENOCARCINOMA ARISING IN A DYSPLASTIC SESSILE SERRATED ADENOMA, WITH  LYMPHOVASCULAR INVASION, SEE COMMENT.  - TUBULAR ADENOMA, 2 FRAGMENTS, NEGATIVE FO  . Arthritis   . Basal cell carcinoma of eyelid 01/03/2013   2019 - R side of Nose  . Benign neoplasm of ascending colon   . Benign neoplasm of descending colon   . Bilateral iliac artery stenosis ([place]) 2007   Bilateral Iliac A stenting; extension of EVAR limbs into  both Iliacs -- additional stent placed in L Iliac.  . Cancer ([place])    Skin cancer  . Colon polyps   . Complication of anesthesia    severe confusion agitation requiring hospital admission x 4 days  . Compression fracture of fourth lumbar vertebra ([place]) 04/2019   - s/p Kyphoplasty; T12, L3-5 [facility])  . COPD (chronic obstructive pulmonary disease) (HCC)    Centrilobular Emphysema  . Coronary artery disease involving native coronary artery without angina pectoris 1998   - s/p CABG, occluded SVG-D1; patent LIMA-LAD, SVG-OM, SVG- RCA  . Dementia ([place])    memory loss - started noting late 2019  . Falls frequently    broke foot  . Fracture 2021   left foot  . GERD (gastroesophageal reflux disease)   . Hemorrhoid 02/10/2015  . History of hiatal hernia   . History of kidney stones   . HOH (hard of hearing)   . Hypertension   . Lewy body dementia ([place]) 04/04/2020  . Macular degeneration disease   . Osteoporosis   . PAF (paroxysmal atrial fibrillation) ([place])    on Eliquis for OAC - Rate Control   . Prostate cancer ([place])   . Prostate disorder   . Sciatic pain    Chronic  . Sleep apnea    cpap  . Temporary cerebral  vascular dysfunction 02/10/2015   Had negative work-up.  July, 2012.  No medication changes, had forgot them that day, got dehydrated.   . Urinary incontinence     Past Surgical History:  Procedure Laterality Date  . ABDOMINAL AORTIC ENDOVASCULAR STENT GRAFT Bilateral 05/18/2019   Procedure: ABDOMINAL AORTIC ENDOVASCULAR STENT GRAFT;  Surgeon: Algernon Huxley, MD;  Location: [facility] ORS;  Service: Vascular;  Laterality: Bilateral;  . ANKLE SURGERY Left    ORIF   . Arch aortogram and carotid aortogram  06/02/2006   Dr Oneida Alar did surgery  . BASAL CELL CARCINOMA EXCISION  04/2016   Dermatology  . CARDIAC CATHETERIZATION  01/30/1998    (Dr. Linard Millers): Native LAD & mRCA CTO. 90% D1. Mod Cx. SVG-D1 CTO. SVG-RCA & SVG-OM along with LIMA-LAD patent;  LV NORMAL Fxn  . CAROTID  ENDARTERECTOMY Left Oct. 29, 2007   Dr. Oneida Alar:  . CATARACT EXTRACTION W/PHACO Right 03/05/2017   Procedure: CATARACT EXTRACTION PHACO AND INTRAOCULAR LENS PLACEMENT (IOC);  Surgeon: Leandrew Koyanagi, MD;  Location: [facility] ORS;  Service: Ophthalmology;  Laterality: Right;  [place] 00:58.5 AP% 10.6 CDE 6.20 Fluid Pack lot # [id number]  . CATARACT EXTRACTION W/PHACO Left 04/15/2017   Procedure: CATARACT EXTRACTION PHACO AND INTRAOCULAR LENS PLACEMENT ([place])  Left;  Surgeon: Leandrew Koyanagi, MD;  Location: [place];  Service: Ophthalmology;  Laterality: Left;  . COLONOSCOPY WITH PROPOFOL N/A 09/15/2017   Procedure: COLONOSCOPY WITH PROPOFOL;  Surgeon: Lucilla Lame, MD;  Location: [facility] ENDOSCOPY;  Service: Endoscopy;  Laterality: N/A;  . COLONOSCOPY WITH PROPOFOL N/A 10/26/2018   Procedure: COLONOSCOPY WITH PROPOFOL;  Surgeon: Lucilla Lame, MD;  Location: [facility] ENDOSCOPY;  Service: Endoscopy;  Laterality: N/A;  . CORONARY ARTERY BYPASS GRAFT  1998   LIMA-LAD, SVG-OM, SVG-RPDA, SVG-DIAG  . CPET / MET  12/2012   Mild chronotropic incompetence - read 82% of predicted; also reduced effort; peak VO2 15.7 / 75% (did not reach Max effort) -- suggested ischemic response in last 1.5 minutes of exercise. Normal pulmonary function on PFTs but poor response to  . CYSTOSCOPY W/ RETROGRADES Left 01/13/2020   Procedure: CYSTOSCOPY WITH RETROGRADE PYELOGRAM;  Surgeon: Billey Co, MD;  Location: [facility] ORS;  Service: Urology;  Laterality: Left;  . CYSTOSCOPY WITH STENT PLACEMENT Left 12/26/2019   Procedure: CYSTOSCOPY WITH STENT PLACEMENT;  Surgeon: Billey Co, MD;  Location: [facility] ORS;  Service: Urology;  Laterality: Left;  . CYSTOSCOPY/URETEROSCOPY/HOLMIUM LASER/STENT PLACEMENT Left 01/13/2020   Procedure: CYSTOSCOPY/URETEROSCOPY/HOLMIUM LASER/STENT EXCHANGE;  Surgeon: Billey Co, MD;  Location: [facility] ORS;  Service: Urology;  Laterality: Left;  . ENDOVASCULAR REPAIR/STENT GRAFT N/A 05/18/2019    Procedure: ENDOVASCULAR REPAIR/STENT GRAFT;  Surgeon: Algernon Huxley, MD;  Location: [facility] INVASIVE CV LAB;; EVAR - 23 mm prox, 12 cm distal & x 18 cm length Gore Excluder Endoprosthesis Main body (via RFA distal to lowest Renal A); 67m x 14 cm L Contralateral Limb for L Iliac - extended with 8 mm x 6cm LifeStar stent; no additional R Iliac PTA needed.   . ESOPHAGOGASTRODUODENOSCOPY (EGD) WITH PROPOFOL N/A 09/15/2017   Procedure: ESOPHAGOGASTRODUODENOSCOPY (EGD) WITH PROPOFOL;  Surgeon: WLucilla Lame MD;  [facility];  Service: Endoscopy;  Laterality: N/A;  . FRACTURE SURGERY Right 03/19/2015   wrist  . FRACTURE SURGERY Left   . HIATAL HERNIA REPAIR    . ILIAC ARTERY STENT  12/15/2005   PTA and direct stenting rgt and lft common iliac arteries  . KYPHOPLASTY N/A 04/05/2019   Procedure:  KYPHOPLASTY T12, L3, L4 L5;  Surgeon: Hessie Knows, MD;  Location: [facility] ORS;  Service: Orthopedics;  Laterality: N/A;  . NM GATED MYOVIEW ([place])  05/'19; 8/'20   a) [facility]: Low Risk - no ischemia or infarct.  EF > 65%. no RWMA;; b) [facility]): Normal wall motion.  No ischemia or infarction.  . OPEN REDUCTION INTERNAL FIXATION (ORIF) DISTAL RADIAL FRACTURE Left 01/13/2019   Procedure: OPEN REDUCTION INTERNAL FIXATION (ORIF) DISTAL  RADIAL FRACTURE - LEFT - SLEEP APNEA;  Surgeon: Hessie Knows, MD;  Location: [facility] ORS;  Service: Orthopedics;  Laterality: Left;  . ORIF ELBOW FRACTURE Right 01/01/2017   Procedure: OPEN REDUCTION INTERNAL FIXATION (ORIF) ELBOW/OLECRANON FRACTURE;  Surgeon: Hessie Knows, MD;  Location: [facility] ORS;  Service: Orthopedics;  Laterality: Right;  . ORIF WRIST FRACTURE Right 03/19/2015   Procedure: OPEN REDUCTION INTERNAL FIXATION (ORIF) WRIST FRACTURE;  Surgeon: Hessie Knows, MD;  Location: [facility] ORS;  Service: Orthopedics;  Laterality: Right;  . SHOULDER ARTHROSCOPY Right   . THORACIC AORTA - CAROTID ANGIOGRAM  October 2007   Dr. 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 TRANSTHORACIC ECHOCARDIOGRAM  February 2016   [facility]: Normal LV function. Dilated left atrium.  Domingo Dimes ECHOCARDIOGRAM  04/2019   [facility] April 20, 2019): EF 55-60 %.  Mild LVH.  Relatively normal valves.    There were no vitals filed for this visit.   Subjective Assessment - 10/02/20 1254    Subjective Patient was taken to ED last session due to altered mental status, where a code stroke was called and CT as well as MRI imaging performed.    Pertinent History Patient is returning to PT for evaluation of balance. Patient has been seen by this therapist in the past. PMH AAA, arthritis, basal cell carcinoma of eyelid, benign neoplasm of ascending/descending colon, bilateral iliac artery stenosis, colon polyps, compression fx of 4th lumbar vertebra (s/p kyphoplasty T12, L3-5), COPD, CAD, dementia, falls,  GERD, hemorroids, HOH, HTN, Lewy Body Dementia, macular degeneration disease, osteoporosis, PAF, prostate cancer, prostate disorder, sciatic pain, sleep apnea, temporary cerebral vascular dysfunction. Comes with aide, having multiple near falls per day per aide, mainly to the sides.    Limitations Walking;Standing;House hold activities;Other (comment);Lifting;Sitting    How long can you sit comfortably? n/a back pain no longer limiting    How long can you stand comfortably? 10-12 with walker    How long can you walk comfortably? walks 15 minutes with a walker    Diagnostic tests MRI from [place] dated 03/28/2019 report was reviewed today. Acute compression fractures are noted at T12, L3, and L4. He has facet arthropathy at the lower lumbar levels. At L3-4 he has moderate central stenosis.    Patient Stated Goals walk without stumbling, be able to be steady enough to not have to have someone holding his gait belt.    Currently in Pain? Yes    Pain Location Neck    Pain Orientation  Lower    Pain Descriptors / Indicators Aching    Pain Type Chronic pain           BP at start of session 116/61  BP after exercise: 121/60   Patient was taken to ED last session due to altered mental status, where a code stroke was called and CT as well as MRI imaging performed.     TherEx:     Standing with #  4 ankle weight: CGA for stability -Hip abduction with BUE upper extremity support, cueing for neutral foot alignment for correct muscle activation, 15x each side  -Hip flexion with BUE upper extremity support, cueing for body mechanics, speed of muscle recruitment for optimal strengthening and stabilization 15x each LE cue for height of arc.  -hip extension 15x each LE    Seated:  4lb ankle weight -LAQ 15x each LE -march 15x each LE -seated adduction ball squeeze 20x  STS throw basketball at aide ; requires SUE to stand up and sit down 10x; very challenging for patient.    Neuro Re-ed:  forward/ backwards ambulation 6x length of // bars. nitially challenging with posterior ambulation improved with repetition.     Ball toss 20x each LE  -lateral step over two consecutive orange hurdle and back 12x each direction; deteriorated with repetition    Half foam roller df/pf 15x with BUE support       Pt educated throughout session about proper posture and technique with exercises. Improved exercise technique, movement at target joints, use of target muscles after min to mod verbal, visual, tactile cues.                           PT Education - 10/02/20 1255    Education provided Yes    Education Details exercise technique, body mechanics, vital monitoring    Person(s) Educated Patient    Methods Explanation;Demonstration;Tactile cues;Verbal cues    Comprehension Verbalized understanding;Returned demonstration;Verbal cues required;Tactile cues required            PT Short Term Goals - 08/21/20 0959      PT SHORT TERM GOAL #1   Title Patient  will be independent in home exercise program to improve strength/mobility for better functional independence with ADLs.    Baseline 11/11: HEP given 12/21: HEP compliant    Time 4    Period Weeks    Status Achieved    Target Date 08/09/20             PT Long Term Goals - 08/21/20 0958      PT LONG TERM GOAL #1   Title Patient will increase FOTO score to equal to or greater than 51% to demonstrate statistically significant improvement in mobility and quality of life.    Baseline 11/11: 42% 12/21: 46.8%    Time 12    Period Weeks    Status Partially Met    Target Date 10/04/20      PT LONG TERM GOAL #2   Title Patient will demonstrate an improved Berg Balance Score of > 40/56 as to demonstrate improved balance with ADLs such as sitting/standing and transfer balance and reduced fall risk.    Baseline 11/11: 34/56 12/21: 37/56    Time 12    Period Weeks    Status Partially Met    Target Date 10/04/20      PT LONG TERM GOAL #3   Title Patient (> 67 years old) will complete five times sit to stand test in < 15 seconds without UE support indicating an increased LE strength and improved balance    Baseline 11/11; 23.35 seconds with UE support 12/21: 27.85 with one near LOB    Time 12    Period Weeks    Status On-going    Target Date 10/04/20      PT LONG TERM GOAL #4   Title Patient will increase 10 meter walk  test to >1.60m/s with LRAD  as to improve gait speed for better community ambulation and to reduce fall risk.    Baseline 11/11: 0.74 m/s without AD with one near LOB 12/21: 1.08 m/s    Time 12    Period Weeks    Status Achieved      PT LONG TERM GOAL #5   Title Patient will increase BLE gross strength to 4+/5 as to improve functional strength for independent gait, increased standing tolerance and increased ADL ability.    Baseline 11/11: see note    Time 12    Period Weeks    Status Partially Met    Target Date 10/04/20                 Plan - 10/02/20 1256     Clinical Impression Statement Patient presents after visit to ED after last session. Aide is monitoring BP at home but has noticied continued limitations with safety awareness. Patient's cognition improved as session progressed with vitals remaining in therapeutic range. Patient tolerated session well and aide pleasd with patient performance. Patient continues to benefit from additional skilled PT services to improve LE strength and balance for improved quality of life.    Personal Factors and Comorbidities Age;Comorbidity 3+;Fitness;Past/Current Experience;Sex;Social Background;Time since onset of injury/illness/exacerbation;Transportation    Comorbidities AAA, arthritis, atherosclerotic heart disease, (s/p CABG), benign neoplasm of ascending/descending colon, COPD, CAD, dementia, dysrhythmia, frequent falls, GERD, hemorrhoid, HOH, HTN, PAF, prostate cancer.    Examination-Activity Limitations Bathing;Bend;Carry;Dressing;Continence;Stairs;Squat;Reach Overhead;Locomotion Level;Lift;Stand;Transfers;Toileting;Bed Mobility    Examination-Participation Restrictions Church;Cleaning;Community Activity;Driving;Interpersonal Relationship;Laundry;Volunteer;Shop;Personal Finances;Meal Prep;Yard Work    Product manager    Rehab Potential Fair    Clinical Impairments Affecting Rehab Potential (+) good support system, high prior level of function (-) currently undergoing radiation therapy for prostate cancer, memory deficits     PT Frequency 2x / week    PT Duration 12 weeks    PT Treatment/Interventions ADLs/Self Care Home Management;Aquatic Therapy;Cryotherapy;Moist Heat;Traction;Ultrasound;Therapeutic activities;Functional mobility training;Stair training;Gait training;DME Instruction;Therapeutic exercise;Balance training;Neuromuscular re-education;Patient/family education;Manual techniques;Passive range of motion;Energy  conservation;Vestibular;Taping;Biofeedback;Electrical Stimulation;Iontophoresis 4mg /ml Dexamethasone;Dry needling;Visual/perceptual remediation/compensation;Cognitive remediation;Canalith Repostioning    PT Next Visit Plan balance, strength    PT Home Exercise Plan see above    Consulted and Agree with Plan of Care Patient;Family member/caregiver    Family Member Consulted aide           Patient will benefit from skilled therapeutic intervention in order to improve the following deficits and impairments:  Abnormal gait,Cardiopulmonary status limiting activity,Decreased activity tolerance,Decreased balance,Decreased knowledge of precautions,Decreased endurance,Decreased coordination,Decreased cognition,Decreased knowledge of use of DME,Decreased mobility,Difficulty walking,Decreased safety awareness,Decreased strength,Impaired flexibility,Impaired perceived functional ability,Impaired sensation,Postural dysfunction,Improper body mechanics,Pain,Impaired vision/preception,Decreased range of motion  Visit Diagnosis: Unsteadiness on feet  Muscle weakness (generalized)  Other abnormalities of gait and mobility     Problem List Patient Active Problem List   Diagnosis Date Noted  . AAA (abdominal aortic aneurysm) without rupture ([place]) 06/29/2020  . LBD (Lewy body dementia) ([place]) 05/14/2020  . Numbness and tingling in left hand 03/02/2020  . Hematuria   . Acute kidney injury superimposed on CKD ([place])   . Sepsis due to gram-negative UTI ([place]) 12/26/2019  . Left nephrolithiasis 12/26/2019  . Hydronephrosis, left 12/26/2019  . Status post abdominal aortic aneurysm (AAA) repair 04/15/2019  . Do not intubate, cardiopulmonary resuscitation (CPR)-only code status 10/08/2018  . Prostate cancer ([place]) 02/25/2018  . Prostatic cyst 01/15/2018  . Blood in stool   . Abnormal feces   .  Stricture and stenosis of esophagus   . Mild cognitive impairment 08/18/2017  . Senile purpura ([place]) 04/29/2017  .  Anemia 04/29/2017  . Frequent falls 04/29/2017  . Simple chronic bronchitis ([name]) 12/17/2016  . Benign localized hyperplasia of prostate with urinary obstruction 07/15/2016  . Elevated PSA 07/15/2016  . Incomplete emptying of bladder 07/15/2016  . Urge incontinence 07/15/2016  . Hypothyroidism 04/25/2016  . Macular degeneration 04/25/2016  . Erectile dysfunction due to arterial insufficiency   . Ischemic heart disease with chronotropic incompetence   . CN (constipation) 02/10/2015  . DD (diverticular disease) 02/10/2015  . Weakness 02/10/2015  . Lichen planus 77/93/9688  . Restless leg 02/10/2015  . 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 B12 deficiency 02/10/2015  . COPD (chronic obstructive pulmonary disease) ([place]) 11/14/2014  . Post Inflammatory Lung Changes 11/14/2014  . PAF (paroxysmal atrial fibrillation) (HCC) CHA2DS2-VASc = 4. AC = Eliquis   . Insomnia 12/09/2013  . OSA on CPAP   . Dyspnea on exertion - -essentially resolved with BB dose reduction & wgt loss 03/29/2013  . Overweight (BMI 25.0-29.9) -- Wgt back up 01/17/2013  . Hx of CABG   . Essential hypertension   . Hyperlipidemia with target LDL less than 70   . Aorto-iliac disease ([place])   . BCC (basal cell carcinoma), eyelid 01/03/2013  . Allergic rhinitis 08/17/2009  . Adaptation reaction 05/28/2009  . Acid reflux 05/28/2009  . Arthritis, degenerative 05/28/2009  . 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 Carotid artery occlusion without infarction 06/01/2006  . PAD (peripheral artery disease) - bilateral common iliac stents 12/15/2005  . Atherosclerotic heart disease of artery bypass graft 09/01/1997   Janna Arch, PT, DPT   10/02/2020, 12:57 PM  [place] MAIN [facility] SERVICES [address] [place], [place], [address] Phone: [phone]   Fax:  [phone]  Name: [name] MRN: [id number] Date of Birth: 07-21-1934

## 2020-10-02 NOTE — Telephone Encounter (Signed)
Notes to clinic: review medication for use Was filled by a different provider    Requested Prescriptions  Pending Prescriptions Disp Refills   lisinopril (ZESTRIL) 2.5 MG tablet [Pharmacy Med Name: LISINOPRIL 2.5 MG TAB] 180 tablet     Sig: TAKE ONE TABLET BY MOUTH TWICE DAILY      Cardiovascular:  ACE Inhibitors Failed - 10/02/2020  9:24 AM      Failed - Last BP in normal range    BP Readings from Last 1 Encounters:  09/27/20 (!) 168/94          Passed - Cr in normal range and within 180 days    Creat  Date Value Ref Range Status  07/10/2017 1.29 (H) 0.70 - 1.11 mg/dL Final    Comment:    For patients >97 years of age, the reference limit for Creatinine is approximately 13% higher for people identified as African-American. .    Creatinine, Ser  Date Value Ref Range Status  09/27/2020 1.07 0.61 - 1.24 mg/dL Final          Passed - K in normal range and within 180 days    Potassium  Date Value Ref Range Status  09/27/2020 4.3 3.5 - 5.1 mmol/L Final  09/28/2014 3.9 3.5 - 5.1 mmol/L Final          Passed - Patient is not pregnant      Passed - Valid encounter within last 6 months    Recent Outpatient Visits           2 months ago Anemia due to vitamin B12 deficiency, unspecified B12 deficiency type   [facility] [name], [facility]   4 months ago Essential hypertension   [facility] [name], MD   4 months ago Other fatigue   [facility] [name], MD   6 months ago Vivid dream   [place], Kirstie Peri, MD   8 months ago Acute kidney injury superimposed on CKD [facility])   [facility] [name], MD       Future Appointments             In 1 month [place], Ronda Fairly, MD [facility]              Signed Prescriptions Disp Refills   ezetimibe (ZETIA) 10 MG tablet 90 tablet 0    Sig: TAKE 1 TABLET BY MOUTH DAILY       Cardiovascular:  Antilipid - Sterol Transport Inhibitors Failed - 10/02/2020  9:24 AM      Failed - HDL in normal range and within 360 days    HDL  Date Value Ref Range Status  12/24/2019 39 (L) >40 mg/dL Final  10/14/2018 35 (L) >39 mg/dL Final          Passed - Total Cholesterol in normal range and within 360 days    Cholesterol, Total  Date Value Ref Range Status  10/14/2018 110 100 - 199 mg/dL Final   Cholesterol  Date Value Ref Range Status  12/24/2019 119 0 - 200 mg/dL Final          Passed - LDL in normal range and within 360 days    LDL Calculated  Date Value Ref Range Status  10/14/2018 44 0 - 99 mg/dL Final   LDL Cholesterol  Date Value Ref Range Status  12/24/2019 62 0 - 99 mg/dL Final    Comment:  Total Cholesterol/HDL:CHD Risk Coronary Heart Disease Risk Table                     Men   Women  1/2 Average Risk   3.4   3.3  Average Risk       5.0   4.4  2 X Average Risk   9.6   7.1  3 X Average Risk  23.4   11.0        Use the calculated Patient Ratio above and the CHD Risk Table to determine the patient's CHD Risk.        ATP III CLASSIFICATION (LDL):  <100     mg/dL   Optimal  100-129  mg/dL   Near or Above                    Optimal  130-159  mg/dL   Borderline  160-189  mg/dL   High  >190     mg/dL   Very High Performed at [facility], [address] [place], [place] [address]           Passed - Triglycerides in normal range and within 360 days    Triglycerides  Date Value Ref Range Status  12/24/2019 90 <150 mg/dL Final          Passed - Valid encounter within last 12 months    Recent Outpatient Visits           2 months ago Anemia due to vitamin B12 deficiency, unspecified B12 deficiency type   [facility] [name], MD   4 months ago Essential hypertension   [facility] [name], MD   4 months ago Other fatigue   [facility] [name], MD   6  months ago Vivid dream   [place], Kirstie Peri, MD   8 months ago Acute kidney injury superimposed on CKD [facility])   [facility] [name], MD       Future Appointments             In 1 month Stoioff, Ronda Fairly, MD [facility]               rosuvastatin (CRESTOR) 20 MG tablet 90 tablet 0    Sig: TAKE ONE TABLET BY MOUTH EVERY DAY      Cardiovascular:  Antilipid - Statins Failed - 10/02/2020  9:24 AM      Failed - HDL in normal range and within 360 days    HDL  Date Value Ref Range Status  12/24/2019 39 (L) >40 mg/dL Final  10/14/2018 35 (L) >39 mg/dL Final          Passed - Total Cholesterol in normal range and within 360 days    Cholesterol, Total  Date Value Ref Range Status  10/14/2018 110 100 - 199 mg/dL Final   Cholesterol  Date Value Ref Range Status  12/24/2019 119 0 - 200 mg/dL Final          Passed - LDL in normal range and within 360 days    LDL Calculated  Date Value Ref Range Status  10/14/2018 44 0 - 99 mg/dL Final   LDL Cholesterol  Date Value Ref Range Status  12/24/2019 62 0 - 99 mg/dL Final    Comment:           Total Cholesterol/HDL:CHD Risk Coronary Heart Disease Risk Table  Men   Women  1/2 Average Risk   3.4   3.3  Average Risk       5.0   4.4  2 X Average Risk   9.6   7.1  3 X Average Risk  23.4   11.0        Use the calculated Patient Ratio above and the CHD Risk Table to determine the patient's CHD Risk.        ATP III CLASSIFICATION (LDL):  <100     mg/dL   Optimal  100-129  mg/dL   Near or Above                    Optimal  130-159  mg/dL   Borderline  160-189  mg/dL   High  >190     mg/dL   Very High Performed at [facility], [address], [place], [place] [address]           Passed - Triglycerides in normal range and within 360 days    Triglycerides  Date Value Ref Range Status  12/24/2019 90 <150 mg/dL Final           Passed - Patient is not pregnant      Passed - Valid encounter within last 12 months    Recent Outpatient Visits           2 months ago Anemia due to vitamin B12 deficiency, unspecified B12 deficiency type   [facility] [name], MD   4 months ago Essential hypertension   [facility] [name], MD   4 months ago Other fatigue   [facility] [name], MD   6 months ago Vivid dream   [facility] [name], MD   8 months ago Acute kidney injury superimposed on CKD [facility])   [facility] [name], MD       Future Appointments             In 1 month [place], Ronda Fairly, [place]

## 2020-10-02 NOTE — Telephone Encounter (Signed)
Copied from [place] [phone]. Topic: General - Other >> Oct 02, 2020  1:42 PM Leward Quan A wrote: Reason for CRM: Levada Dy with [facility] called in to inquire of Dr Caryn Section if they can get a copy of the patient H&P he will be admitted into their care and need this information as soon as possible Ph# [phone] or [phone] Fax# [phone]

## 2020-10-02 NOTE — Telephone Encounter (Signed)
Tried [employer], [employer]. No answer. Phone kept ringing. Unable to leave a voice message. I called patient's son [name] and advised him that the facility needs to fax to [place] a signed ROI for any medical records they are requesting. [name] verbalized understanding.

## 2020-10-04 ENCOUNTER — Other Ambulatory Visit: Payer: Self-pay

## 2020-10-04 ENCOUNTER — Ambulatory Visit: Payer: Medicare Other

## 2020-10-04 DIAGNOSIS — R2681 Unsteadiness on feet: Secondary | ICD-10-CM | POA: Diagnosis not present

## 2020-10-04 DIAGNOSIS — R2689 Other abnormalities of gait and mobility: Secondary | ICD-10-CM

## 2020-10-04 DIAGNOSIS — M6281 Muscle weakness (generalized): Secondary | ICD-10-CM

## 2020-10-04 NOTE — Therapy (Signed)
[place] MAIN [facility] SERVICES [address] [place], [place], [address] Phone: [phone]   Fax:  [phone]  Physical Therapy Treatment/RECERT/Physical Therapy Progress Note   Dates of reporting period  08/21/20   to   10/04/20  Patient Details  Name: [name] MRN: [id number] Date of Birth: Apr 07, 1934 Referring Provider (PT): Jennings Books K    Encounter Date: 10/04/2020   PT End of Session - 10/04/20 0928    Visit Number 20    Number of Visits 44    Date for PT Re-Evaluation 12/27/20    Authorization Type 10/10; next session 1/10 PN 10/04/20    PT Start Time 0929    PT Stop Time 1013    PT Time Calculation (min) 44 min    Equipment Utilized During Treatment Gait belt    Activity Tolerance Patient tolerated treatment well    Behavior During Therapy [facility] for tasks assessed/performed           Past Medical History:  Diagnosis Date  . AAA (abdominal aortic aneurysm) ([place]) 05/2019   [place]ativley Rapid progression from June-Aug 2020 (4.6 - 5.4 cm): s/p EVAR (Dr. Lucky Cowboy [facility]): EVAR - 23 mm prox, 12 cm distal & x 18 cm length Gore Excluder Endoprosthesis Main body (via RFA distal to lowest Renal A); 61m x 14 cm L Contralateral Limb for L Iliac - extended with 8 mm x 6cm LifeStar stent (post-dilated with 7 mm DEB).  Additional R Iliac PTA not required.   . 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 ([place] 09/15/2017   09-15-2017 DIAGNOSIS:  A. COLON POLYP X 2, ASCENDING; COLD SNARE:  - TUBULAR ADENOMA.  - NEGATIVE FOR HIGH-GRADE DYSPLASIA AND MALIGNANCY.  - NO ADDITIONAL TISSUE IDENTIFIED; FECAL MATERIAL PRESENT.   B. COLON POLYP X 2, DESCENDING; COLD SNARE:  - ADENOCARCINOMA ARISING IN A DYSPLASTIC SESSILE SERRATED ADENOMA, WITH  LYMPHOVASCULAR INVASION, SEE COMMENT.  - TUBULAR ADENOMA, 2 FRAGMENTS, NEGATIVE FO  . Arthritis   . Basal cell carcinoma of eyelid 01/03/2013   2019 - R side of Nose  . Benign neoplasm of ascending colon   . Benign neoplasm of  descending colon   . Bilateral iliac artery stenosis ([place] 2007   Bilateral Iliac A stenting; extension of EVAR limbs into both Iliacs -- additional stent placed in L Iliac.  . Cancer (HF[place]    Skin cancer  . Colon polyps   . Complication of anesthesia    severe confusion agitation requiring hospital admission x 4 days  . Compression fracture of fourth lumbar vertebra ([place] 04/2019   - s/p Kyphoplasty; T12, L3-5 ([facility]  . COPD (chronic obstructive pulmonary disease) (HCC)    Centrilobular Emphysema  . Coronary artery disease involving native coronary artery without angina pectoris 1998   - s/p CABG, occluded SVG-D1; patent LIMA-LAD, SVG-OM, SVG- RCA  . Dementia (HL[place]    memory loss - started noting late 2019  . Falls frequently    broke foot  . Fracture 2021   left foot  . GERD (gastroesophageal reflux disease)   . Hemorrhoid 02/10/2015  . History of hiatal hernia   . History of kidney stones   . HOH (hard of hearing)   . Hypertension   . Lewy body dementia (HR[place] 04/04/2020  . Macular degeneration disease   . Osteoporosis   . PAF (paroxysmal atrial fibrillation) ([place]    on Eliquis for OAC - Rate Control   . Prostate cancer (HL[place]   . Prostate  disorder   . Sciatic pain    Chronic  . Sleep apnea    cpap  . Temporary cerebral vascular dysfunction 02/10/2015   Had negative work-up.  July, 2012.  No medication changes, had forgot them that day, got dehydrated.   . Urinary incontinence     Past Surgical History:  Procedure Laterality Date  . ABDOMINAL AORTIC ENDOVASCULAR STENT GRAFT Bilateral 05/18/2019   Procedure: ABDOMINAL AORTIC ENDOVASCULAR STENT GRAFT;  Surgeon: Algernon Huxley, MD;  Location: [facility] ORS;  Service: Vascular;  Laterality: Bilateral;  . ANKLE SURGERY Left    ORIF   . Arch aortogram and carotid aortogram  06/02/2006   Dr Oneida Alar did surgery  . BASAL CELL CARCINOMA EXCISION  04/2016   Dermatology  . CARDIAC CATHETERIZATION  01/30/1998    (Dr. Linard Millers): Native  LAD & mRCA CTO. 90% D1. Mod Cx. SVG-D1 CTO. SVG-RCA & SVG-OM along with LIMA-LAD patent;  LV NORMAL Fxn  . CAROTID ENDARTERECTOMY Left Oct. 29, 2007   Dr. Oneida Alar:  . CATARACT EXTRACTION W/PHACO Right 03/05/2017   Procedure: CATARACT EXTRACTION PHACO AND INTRAOCULAR LENS PLACEMENT (IOC);  Surgeon: Leandrew Koyanagi, MD;  Location: [facility] ORS;  Service: Ophthalmology;  Laterality: Right;  [place] 00:58.5 AP% 10.6 CDE 6.20 Fluid Pack lot # [id number]  . CATARACT EXTRACTION W/PHACO Left 04/15/2017   Procedure: CATARACT EXTRACTION PHACO AND INTRAOCULAR LENS PLACEMENT ([place])  Left;  Surgeon: Leandrew Koyanagi, MD;  Location: [facility];  Service: Ophthalmology;  Laterality: Left;  . COLONOSCOPY WITH PROPOFOL N/A 09/15/2017   Procedure: COLONOSCOPY WITH PROPOFOL;  Surgeon: Lucilla Lame, MD;  Location[facility] ENDOSCOPY;  Service: Endoscopy;  Laterality: N/A;  . COLONOSCOPY WITH PROPOFOL N/A 10/26/2018   Procedure: COLONOSCOPY WITH PROPOFOL;  Surgeon: Lucilla Lame, MD;  Location: [facility] ENDOSCOPY;  Service: Endoscopy;  Laterality: N/A;  . CORONARY ARTERY BYPASS GRAFT  1998   LIMA-LAD, SVG-OM, SVG-RPDA, SVG-DIAG  . CPET / MET  12/2012   Mild chronotropic incompetence - read 82% of predicted; also reduced effort; peak VO2 15.7 / 75% (did not reach Max effort) -- suggested ischemic response in last 1.5 minutes of exercise. Normal pulmonary function on PFTs but poor response to  . CYSTOSCOPY W/ RETROGRADES Left 01/13/2020   Procedure: CYSTOSCOPY WITH RETROGRADE PYELOGRAM;  Surgeon: Billey Co, MD;  Location: [facility] ORS;  Service: Urology;  Laterality: Left;  . CYSTOSCOPY WITH STENT PLACEMENT Left 12/26/2019   Procedure: CYSTOSCOPY WITH STENT PLACEMENT;  Surgeon: Billey Co, MD;  Location: [facility] ORS;  Service: Urology;  Laterality: Left;  . CYSTOSCOPY/URETEROSCOPY/HOLMIUM LASER/STENT PLACEMENT Left 01/13/2020   Procedure: CYSTOSCOPY/URETEROSCOPY/HOLMIUM LASER/STENT EXCHANGE;  Surgeon: Billey Co, MD;   Location: [facility] ORS;  Service: Urology;  Laterality: Left;  . ENDOVASCULAR REPAIR/STENT GRAFT N/A 05/18/2019   Procedure: ENDOVASCULAR REPAIR/STENT GRAFT;  Surgeon: Algernon Huxley, MD;  Location: [facility] INVASIVE CV LAB;; EVAR - 23 mm prox, 12 cm distal & x 18 cm length Gore Excluder Endoprosthesis Main body (via RFA distal to lowest Renal A); 41m x 14 cm L Contralateral Limb for L Iliac - extended with 8 mm x 6cm LifeStar stent; no additional R Iliac PTA needed.   . ESOPHAGOGASTRODUODENOSCOPY (EGD) WITH PROPOFOL N/A 09/15/2017   Procedure: ESOPHAGOGASTRODUODENOSCOPY (EGD) WITH PROPOFOL;  Surgeon: WLucilla Lame MD;  [facility];  Service: Endoscopy;  Laterality: N/A;  . FRACTURE SURGERY Right 03/19/2015   wrist  . FRACTURE SURGERY Left   . HIATAL HERNIA REPAIR    . ILIAC ARTERY STENT  12/15/2005   PTA and direct stenting rgt and lft common iliac arteries  . KYPHOPLASTY N/A 04/05/2019   Procedure: KYPHOPLASTY T12, L3, L4 L5;  Surgeon: Hessie Knows, MD;  Location: [facility] ORS;  Service: Orthopedics;  Laterality: N/A;  . NM GATED MYOVIEW ([place] HX)  05/'19; 8/'20   a) [facility]: Low Risk - no ischemia or infarct.  EF > 65%. no RWMA;; b) [facility]): Normal wall motion.  No ischemia or infarction.  . OPEN REDUCTION INTERNAL FIXATION (ORIF) DISTAL RADIAL FRACTURE Left 01/13/2019   Procedure: OPEN REDUCTION INTERNAL FIXATION (ORIF) DISTAL  RADIAL FRACTURE - LEFT - SLEEP APNEA;  Surgeon: Hessie Knows, MD;  Location: [facility] ORS;  Service: Orthopedics;  Laterality: Left;  . ORIF ELBOW FRACTURE Right 01/01/2017   Procedure: OPEN REDUCTION INTERNAL FIXATION (ORIF) ELBOW/OLECRANON FRACTURE;  Surgeon: Hessie Knows, MD;  Location: [facility] ORS;  Service: Orthopedics;  Laterality: Right;  . ORIF WRIST FRACTURE Right 03/19/2015   Procedure: OPEN REDUCTION INTERNAL FIXATION (ORIF) WRIST FRACTURE;  Surgeon: Hessie Knows, MD;  Location: [facility] ORS;  Service: Orthopedics;  Laterality: Right;  . SHOULDER ARTHROSCOPY Right    . THORACIC AORTA - CAROTID ANGIOGRAM  October 2007   Dr. 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 TRANSTHORACIC ECHOCARDIOGRAM  February 2016   [facility]: Normal LV function. Dilated left atrium.  Domingo Dimes ECHOCARDIOGRAM  04/2019   [facility] April 20, 2019): EF 55-60 %.  Mild LVH.  Relatively normal valves.    There were no vitals filed for this visit.   Subjective Assessment - 10/04/20 0934    Subjective Patient presents with aide. Has been in a good mood since last session. BP has been at a more stable level.    Pertinent History Patient is returning to PT for evaluation of balance. Patient has been seen by this therapist in the past. PMH AAA, arthritis, basal cell carcinoma of eyelid, benign neoplasm of ascending/descending colon, bilateral iliac artery stenosis, colon polyps, compression fx of 4th lumbar vertebra (s/p kyphoplasty T12, L3-5), COPD, CAD, dementia, falls,  GERD, hemorroids, HOH, HTN, Lewy Body Dementia, macular degeneration disease, osteoporosis, PAF, prostate cancer, prostate disorder, sciatic pain, sleep apnea, temporary cerebral vascular dysfunction. Comes with aide, having multiple near falls per day per aide, mainly to the sides.    Limitations Walking;Standing;House hold activities;Other (comment);Lifting;Sitting    How long can you sit comfortably? n/a back pain no longer limiting    How long can you stand comfortably? 10-12 with walker    How long can you walk comfortably? walks 15 minutes with a walker    Diagnostic tests MRI from [facility] dated 03/28/2019 report was reviewed today. Acute compression fractures are noted at T12, L3, and L4. He has facet arthropathy at the lower lumbar levels. At L3-4 he has moderate central stenosis.    Patient Stated Goals walk without stumbling, be able to be steady enough to not have to have someone holding his gait  belt.    Currently in Pain? No/denies              [facility] PT Assessment - 10/04/20 0001      Standardized Balance Assessment   Standardized Balance Assessment Berg Balance Test      Berg Balance Test   Sit to Stand Able to stand without using hands and stabilize independently    Standing Unsupported Able to stand 2 minutes with supervision  Sitting with Back Unsupported but Feet Supported on Floor or Stool Able to sit safely and securely 2 minutes    Stand to Sit Controls descent by using hands    Transfers Able to transfer safely, definite need of hands    Standing Unsupported with Eyes Closed Able to stand 10 seconds with supervision    Standing Unsupported with Feet Together Able to place feet together independently but unable to hold for 30 seconds    From Standing, Reach Forward with Outstretched Arm Can reach forward >12 cm safely (5")    From Standing Position, Pick up Object from Floor Able to pick up shoe, needs supervision    From Standing Position, Turn to Look Behind Over each Shoulder Looks behind one side only/other side shows less weight shift    Turn 360 Degrees Needs close supervision or verbal cueing    Standing Unsupported, Alternately Place Feet on Step/Stool Able to complete >2 steps/needs minimal assist    Standing Unsupported, One Foot in Front Able to take small step independently and hold 30 seconds    Standing on One Leg Unable to try or needs assist to prevent fall    Total Score 35           BP at start of session. 125/103 L arm, 120/68 R arm   BP after exercise: 144/ 75 L arm  138/65 R arm    Goals: FOTO: 36% BERG: 35/ 56  5x STS: 12. 68 seconds with heavy BUE support  BLE strength  New goal: 6 minute walk test: able to walk 4 minutes for 420 ft   Treatment: 6" step toe taps with stomp 12x each LE Squat with BUE support 10x  Hedgehog taps with PT cueing for sequencing and LE sequencing x 8 each LE    Patient's condition has the  potential to improve in response to therapy. Maximum improvement is yet to be obtained. The anticipated improvement is attainable and reasonable in a generally predictable time.  Patient's aide reports patient is doing much better since starting therapy with exception of past week with the other medical issues.                    PT Education - 10/04/20 [phone]    Education provided Yes    Education Details exercise technique, body mechanics, vitals monitoring    Person(s) Educated Patient    Methods Explanation;Demonstration;Tactile cues;Verbal cues    Comprehension Verbalized understanding;Returned demonstration;Verbal cues required;Tactile cues required            PT Short Term Goals - 08/21/20 0959      PT SHORT TERM GOAL #1   Title Patient will be independent in home exercise program to improve strength/mobility for better functional independence with ADLs.    Baseline 11/11: HEP given 12/21: HEP compliant    Time 4    Period Weeks    Status Achieved    Target Date 08/09/20             PT Long Term Goals - 10/04/20 1235      PT LONG TERM GOAL #1   Title Patient will increase FOTO score to equal to or greater than 51% to demonstrate statistically significant improvement in mobility and quality of life.    Baseline 11/11: 42% 12/21: 46.8% 2/3: 36%    Time 12    Period Weeks    Status Partially Met    Target Date 12/27/20  PT LONG TERM GOAL #2   Title Patient will demonstrate an improved Berg Balance Score of > 40/56 as to demonstrate improved balance with ADLs such as sitting/standing and transfer balance and reduced fall risk.    Baseline 11/11: 34/56 12/21: 37/56 2/3: 35/56    Time 12    Period Weeks    Status On-going    Target Date 12/27/20      PT LONG TERM GOAL #3   Title Patient (> 6 years old) will complete five times sit to stand test in < 15 seconds without UE support indicating an increased LE strength and improved balance    Baseline  11/11; 23.35 seconds with UE support 12/21: 27.85 with one near LOB 2/3: 12.6 seconds with heavy BUE support due to confusion    Time 12    Period Weeks    Status On-going    Target Date 12/27/20      PT LONG TERM GOAL #4   Title Patient will increase 10 meter walk test to >1.30ms with LRAD  as to improve gait speed for better community ambulation and to reduce fall risk.    Baseline 11/11: 0.74 m/s without AD with one near LOB 12/21: 1.08 m/s    Time 12    Period Weeks    Status Achieved      PT LONG TERM GOAL #5   Title Patient will increase BLE gross strength to 4+/5 as to improve functional strength for independent gait, increased standing tolerance and increased ADL ability.    Baseline 11/11: see note 2/3: strength testing limited due to patient confusion    Time 12    Period Weeks    Status Partially Met    Target Date 12/27/20      PT LONG TERM GOAL #6   Title Patient will increase six minute walk test distance to >1000 for progression to community ambulator and improve gait ability    Baseline 2/3: able to perform 4 minutes for 420 ft with rollator    Time 12    Period Weeks    Status New    Target Date 12/27/20                 Plan - 10/04/20 1235    Clinical Impression Statement Patient's recent medical issues have caused a decline in the past week however in general patient has made significant gains prior to incident last week and will continue to benefit from skilled physical therapy. Patient's condition has the potential to improve in response to therapy. Maximum improvement is yet to be obtained. The anticipated improvement is attainable and reasonable in a generally predictable time. Patient continues to benefit from additional skilled PT services to improve LE strength and balance for improved quality of life.    Personal Factors and Comorbidities Age;Comorbidity 3+;Fitness;Past/Current Experience;Sex;Social Background;Time since onset of  injury/illness/exacerbation;Transportation    Comorbidities AAA, arthritis, atherosclerotic heart disease, (s/p CABG), benign neoplasm of ascending/descending colon, COPD, CAD, dementia, dysrhythmia, frequent falls, GERD, hemorrhoid, HOH, HTN, PAF, prostate cancer.    Examination-Activity Limitations Bathing;Bend;Carry;Dressing;Continence;Stairs;Squat;Reach Overhead;Locomotion Level;Lift;Stand;Transfers;Toileting;Bed Mobility    Examination-Participation Restrictions Church;Cleaning;Community Activity;Driving;Interpersonal Relationship;Laundry;Volunteer;Shop;Personal Finances;Meal Prep;Yard Work    SProduct manager   Rehab Potential Fair    Clinical Impairments Affecting Rehab Potential (+) good support system, high prior level of function (-) currently undergoing radiation therapy for prostate cancer, memory deficits     PT Frequency 2x / week    PT Duration 12 weeks  PT Treatment/Interventions ADLs/Self Care Home Management;Aquatic Therapy;Cryotherapy;Moist Heat;Traction;Ultrasound;Therapeutic activities;Functional mobility training;Stair training;Gait training;DME Instruction;Therapeutic exercise;Balance training;Neuromuscular re-education;Patient/family education;Manual techniques;Passive range of motion;Energy conservation;Vestibular;Taping;Biofeedback;Electrical Stimulation;Iontophoresis 7m/ml Dexamethasone;Dry needling;Visual/perceptual remediation/compensation;Cognitive remediation;Canalith Repostioning    PT Next Visit Plan balance, strength    PT Home Exercise Plan see above    Consulted and Agree with Plan of Care Patient;Family member/caregiver    Family Member Consulted aide           Patient will benefit from skilled therapeutic intervention in order to improve the following deficits and impairments:  Abnormal gait,Cardiopulmonary status limiting activity,Decreased activity tolerance,Decreased balance,Decreased knowledge of  precautions,Decreased endurance,Decreased coordination,Decreased cognition,Decreased knowledge of use of DME,Decreased mobility,Difficulty walking,Decreased safety awareness,Decreased strength,Impaired flexibility,Impaired perceived functional ability,Impaired sensation,Postural dysfunction,Improper body mechanics,Pain,Impaired vision/preception,Decreased range of motion  Visit Diagnosis: Unsteadiness on feet  Muscle weakness (generalized)  Other abnormalities of gait and mobility     Problem List Patient Active Problem List   Diagnosis Date Noted  . AAA (abdominal aortic aneurysm) without rupture (HMc[place] 06/29/2020  . LBD (Lewy body dementia) ([place]Maryland City 05/14/2020  . Numbness and tingling in left hand 03/02/2020  . Hematuria   . Acute kidney injury superimposed on CKD (HWest Rich[place]   . Sepsis due to gram-negative UTI ([place] 12/26/2019  . Left nephrolithiasis 12/26/2019  . Hydronephrosis, left 12/26/2019  . Status post abdominal aortic aneurysm (AAA) repair 04/15/2019  . Do not intubate, cardiopulmonary resuscitation (CPR)-only code status 10/08/2018  . Prostate cancer (HClear[place] 02/25/2018  . Prostatic cyst 01/15/2018  . Blood in stool   . Abnormal feces   . Stricture and stenosis of esophagus   . Mild cognitive impairment 08/18/2017  . Senile purpura (HEnglevale 04/29/2017  . Anemia 04/29/2017  . Frequent falls 04/29/2017  . Simple chronic bronchitis ([place] 12/17/2016  . Benign localized hyperplasia of prostate with urinary obstruction 07/15/2016  . Elevated PSA 07/15/2016  . Incomplete emptying of bladder 07/15/2016  . Urge incontinence 07/15/2016  . Hypothyroidism 04/25/2016  . Macular degeneration 04/25/2016  . Erectile dysfunction due to arterial insufficiency   . Ischemic heart disease with chronotropic incompetence   . CN (constipation) 02/10/2015  . DD (diverticular disease) 02/10/2015  . Weakness 02/10/2015  . Lichen planus [id number]/[id number] . Restless leg 02/10/2015  . Circadian rhythm  disorder 02/10/2015  . B12 deficiency 02/10/2015  . COPD (chronic obstructive pulmonary disease) (HNorth [place] 11/14/2014  . Post Inflammatory Lung Changes 11/14/2014  . PAF (paroxysmal atrial fibrillation) (HCC) CHA2DS2-VASc = 4. AC = Eliquis   . Insomnia 12/09/2013  . OSA on CPAP   . Dyspnea on exertion - -essentially resolved with BB dose reduction & wgt loss 03/29/2013  . Overweight (BMI 25.0-29.9) -- Wgt back up 01/17/2013  . Hx of CABG   . Essential hypertension   . Hyperlipidemia with target LDL less than 70   . Aorto-iliac disease (HFerry Pass   . BCC (basal cell carcinoma), eyelid 01/03/2013  . Allergic rhinitis 08/17/2009  . Adaptation reaction 05/28/2009  . Acid reflux 05/28/2009  . Arthritis, degenerative 05/28/2009  . 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 Carotid artery occlusion without infarction 06/01/2006  . PAD (peripheral artery disease) - bilateral common iliac stents 12/15/2005  . Atherosclerotic heart disease of artery bypass graft 09/01/1997   MJanna Arch PT, DPT   10/04/2020, 12:38 PM  CRyeMAIN RNorth Idaho [facility] CtrSERVICES [address] [address]Phone: [phone]  Fax:  [phone] Name: William DUVALMRN: [id number] of Birth: 209/24/1935

## 2020-10-05 ENCOUNTER — Other Ambulatory Visit: Payer: Self-pay

## 2020-10-09 ENCOUNTER — Ambulatory Visit: Payer: Medicare Other | Admitting: Physical Therapy

## 2020-10-09 ENCOUNTER — Other Ambulatory Visit: Payer: Self-pay

## 2020-10-09 DIAGNOSIS — M6281 Muscle weakness (generalized): Secondary | ICD-10-CM

## 2020-10-09 DIAGNOSIS — R2681 Unsteadiness on feet: Secondary | ICD-10-CM

## 2020-10-09 DIAGNOSIS — M545 Low back pain, unspecified: Secondary | ICD-10-CM

## 2020-10-09 DIAGNOSIS — R262 Difficulty in walking, not elsewhere classified: Secondary | ICD-10-CM

## 2020-10-09 DIAGNOSIS — R2689 Other abnormalities of gait and mobility: Secondary | ICD-10-CM

## 2020-10-09 DIAGNOSIS — G8929 Other chronic pain: Secondary | ICD-10-CM

## 2020-10-09 NOTE — Therapy (Signed)
[place] MAIN [facility] SERVICES [address] [place], [place], [address] Phone: [phone]   Fax:  [phone]  Physical Therapy Treatment  Patient Details  Name: [name] MRN: [id number] Date of Birth: 01-06-1934 Referring Provider (PT): Jennings Books K    Encounter Date: 10/09/2020   PT End of Session - 10/09/20 0925    Visit Number 21    Number of Visits 44    Date for PT Re-Evaluation 12/27/20    Authorization Type 10/10; next session 1/10 PN 10/04/20    PT Start Time 0930    PT Stop Time 1015    PT Time Calculation (min) 45 min    Equipment Utilized During Treatment Gait belt    Activity Tolerance Patient tolerated treatment well    Behavior During Therapy WFL for tasks assessed/performed           Past Medical History:  Diagnosis Date  . AAA (abdominal aortic aneurysm) ([place]) 05/2019   [place] Rapid progression from June-Aug 2020 (4.6 - 5.4 cm): s/p EVAR (Dr. Lucky Cowboy [facility]): EVAR - 23 mm prox, 12 cm distal & x 18 cm length Gore Excluder Endoprosthesis Main body (via RFA distal to lowest Renal A); 17mm x 14 cm L Contralateral Limb for L Iliac - extended with 8 mm x 6cm LifeStar stent (post-dilated with 7 mm DEB).  Additional R Iliac PTA not required.   . 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 ([place]) 09/15/2017   09-15-2017 DIAGNOSIS:  A. COLON POLYP X 2, ASCENDING; COLD SNARE:  - TUBULAR ADENOMA.  - NEGATIVE FOR HIGH-GRADE DYSPLASIA AND MALIGNANCY.  - NO ADDITIONAL TISSUE IDENTIFIED; FECAL MATERIAL PRESENT.   B. COLON POLYP X 2, DESCENDING; COLD SNARE:  - ADENOCARCINOMA ARISING IN A DYSPLASTIC SESSILE SERRATED ADENOMA, WITH  LYMPHOVASCULAR INVASION, SEE COMMENT.  - TUBULAR ADENOMA, 2 FRAGMENTS, NEGATIVE FO  . Arthritis   . Basal cell carcinoma of eyelid 01/03/2013   2019 - R side of Nose  . Benign neoplasm of ascending colon   . Benign neoplasm of descending colon   . Bilateral iliac artery stenosis ([place]) 2007   Bilateral Iliac A  stenting; extension of EVAR limbs into both Iliacs -- additional stent placed in L Iliac.  . Cancer ([place])    Skin cancer  . Colon polyps   . Complication of anesthesia    severe confusion agitation requiring hospital admission x 4 days  . Compression fracture of fourth lumbar vertebra ([place]) 04/2019   - s/p Kyphoplasty; T12, L3-5 [facility])  . COPD (chronic obstructive pulmonary disease) (HCC)    Centrilobular Emphysema  . Coronary artery disease involving native coronary artery without angina pectoris 1998   - s/p CABG, occluded SVG-D1; patent LIMA-LAD, SVG-OM, SVG- RCA  . Dementia ([place])    memory loss - started noting late 2019  . Falls frequently    broke foot  . Fracture 2021   left foot  . GERD (gastroesophageal reflux disease)   . Hemorrhoid 02/10/2015  . History of hiatal hernia   . History of kidney stones   . HOH (hard of hearing)   . Hypertension   . Lewy body dementia ([place]) 04/04/2020  . Macular degeneration disease   . Osteoporosis   . PAF (paroxysmal atrial fibrillation) ([place])    on Eliquis for OAC - Rate Control   . Prostate cancer ([place])   . Prostate disorder   . Sciatic pain    Chronic  . Sleep apnea  cpap  . Temporary cerebral vascular dysfunction 02/10/2015   Had negative work-up.  July, 2012.  No medication changes, had forgot them that day, got dehydrated.   . Urinary incontinence     Past Surgical History:  Procedure Laterality Date  . ABDOMINAL AORTIC ENDOVASCULAR STENT GRAFT Bilateral 05/18/2019   Procedure: ABDOMINAL AORTIC ENDOVASCULAR STENT GRAFT;  Surgeon: Algernon Huxley, MD;  Location: [facility] ORS;  Service: Vascular;  Laterality: Bilateral;  . ANKLE SURGERY Left    ORIF   . Arch aortogram and carotid aortogram  06/02/2006   Dr Oneida Alar did surgery  . BASAL CELL CARCINOMA EXCISION  04/2016   Dermatology  . CARDIAC CATHETERIZATION  01/30/1998    (Dr. Linard Millers): Native LAD & mRCA CTO. 90% D1. Mod Cx. SVG-D1 CTO. SVG-RCA & SVG-OM along with LIMA-LAD  patent;  LV NORMAL Fxn  . CAROTID ENDARTERECTOMY Left Oct. 29, 2007   Dr. Oneida Alar:  . CATARACT EXTRACTION W/PHACO Right 03/05/2017   Procedure: CATARACT EXTRACTION PHACO AND INTRAOCULAR LENS PLACEMENT (IOC);  Surgeon: Leandrew Koyanagi, MD;  Location: [facility] ORS;  Service: Ophthalmology;  Laterality: Right;  [place] 00:58.5 AP% 10.6 CDE 6.20 Fluid Pack lot # [id number]  . CATARACT EXTRACTION W/PHACO Left 04/15/2017   Procedure: CATARACT EXTRACTION PHACO AND INTRAOCULAR LENS PLACEMENT ([place])  Left;  Surgeon: Leandrew Koyanagi, MD;  Location: [place];  Service: Ophthalmology;  Laterality: Left;  . COLONOSCOPY WITH PROPOFOL N/A 09/15/2017   Procedure: COLONOSCOPY WITH PROPOFOL;  Surgeon: Lucilla Lame, MD;  Location: [facility] ENDOSCOPY;  Service: Endoscopy;  Laterality: N/A;  . COLONOSCOPY WITH PROPOFOL N/A 10/26/2018   Procedure: COLONOSCOPY WITH PROPOFOL;  Surgeon: Lucilla Lame, MD;  Location[facility];  Service: Endoscopy;  Laterality: N/A;  . CORONARY ARTERY BYPASS GRAFT  1998   LIMA-LAD, SVG-OM, SVG-RPDA, SVG-DIAG  . CPET / MET  12/2012   Mild chronotropic incompetence - read 82% of predicted; also reduced effort; peak VO2 15.7 / 75% (did not reach Max effort) -- suggested ischemic response in last 1.5 minutes of exercise. Normal pulmonary function on PFTs but poor response to  . CYSTOSCOPY W/ RETROGRADES Left 01/13/2020   Procedure: CYSTOSCOPY WITH RETROGRADE PYELOGRAM;  Surgeon: Billey Co, MD;  Location: [facility] ORS;  Service: Urology;  Laterality: Left;  . CYSTOSCOPY WITH STENT PLACEMENT Left 12/26/2019   Procedure: CYSTOSCOPY WITH STENT PLACEMENT;  Surgeon: Billey Co, MD;  Location: [facility] ORS;  Service: Urology;  Laterality: Left;  . CYSTOSCOPY/URETEROSCOPY/HOLMIUM LASER/STENT PLACEMENT Left 01/13/2020   Procedure: CYSTOSCOPY/URETEROSCOPY/HOLMIUM LASER/STENT EXCHANGE;  Surgeon: Billey Co, MD;  Location: [facility] ORS;  Service: Urology;  Laterality: Left;  . ENDOVASCULAR  REPAIR/STENT GRAFT N/A 05/18/2019   Procedure: ENDOVASCULAR REPAIR/STENT GRAFT;  Surgeon: Algernon Huxley, MD;  Location: [facility] INVASIVE CV LAB;; EVAR - 23 mm prox, 12 cm distal & x 18 cm length Gore Excluder Endoprosthesis Main body (via RFA distal to lowest Renal A); 70m x 14 cm L Contralateral Limb for L Iliac - extended with 8 mm x 6cm LifeStar stent; no additional R Iliac PTA needed.   . ESOPHAGOGASTRODUODENOSCOPY (EGD) WITH PROPOFOL N/A 09/15/2017   Procedure: ESOPHAGOGASTRODUODENOSCOPY (EGD) WITH PROPOFOL;  Surgeon: WLucilla Lame MD;  [facility];  Service: Endoscopy;  Laterality: N/A;  . FRACTURE SURGERY Right 03/19/2015   wrist  . FRACTURE SURGERY Left   . HIATAL HERNIA REPAIR    . ILIAC ARTERY STENT  12/15/2005   PTA and direct stenting rgt and lft common iliac arteries  . KYPHOPLASTY  N/A 04/05/2019   Procedure: KYPHOPLASTY T12, L3, L4 L5;  Surgeon: Kennedy Bucker, MD;  Location: [facility] ORS;  Service: Orthopedics;  Laterality: N/A;  . NM GATED MYOVIEW ([facility] HX)  05/'19; 8/'20   a) [facility]: Low Risk - no ischemia or infarct.  EF > 65%. no RWMA;; b) [facility]): Normal wall motion.  No ischemia or infarction.  . OPEN REDUCTION INTERNAL FIXATION (ORIF) DISTAL RADIAL FRACTURE Left 01/13/2019   Procedure: OPEN REDUCTION INTERNAL FIXATION (ORIF) DISTAL  RADIAL FRACTURE - LEFT - SLEEP APNEA;  Surgeon: Kennedy Bucker, MD;  Location: [facility] ORS;  Service: Orthopedics;  Laterality: Left;  . ORIF ELBOW FRACTURE Right 01/01/2017   Procedure: OPEN REDUCTION INTERNAL FIXATION (ORIF) ELBOW/OLECRANON FRACTURE;  Surgeon: Kennedy Bucker, MD;  Location: [facility] ORS;  Service: Orthopedics;  Laterality: Right;  . ORIF WRIST FRACTURE Right 03/19/2015   Procedure: OPEN REDUCTION INTERNAL FIXATION (ORIF) WRIST FRACTURE;  Surgeon: Kennedy Bucker, MD;  Location: [facility] ORS;  Service: Orthopedics;  Laterality: Right;  . SHOULDER ARTHROSCOPY Right   . THORACIC AORTA - CAROTID ANGIOGRAM  October 2007   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 TRANSTHORACIC ECHOCARDIOGRAM  February 2016   [facility]: Normal LV function. Dilated left atrium.  Krystal Clark ECHOCARDIOGRAM  04/2019   [facility] April 20, 2019): EF 55-60 %.  Mild LVH.  Relatively normal valves.    There were no vitals filed for this visit.    Vital signs at start of session:  BP: 123/69 SaO2: 100% HR: 67  Vital sings at end of session:  BP: 114/71 SaO2: 96 HR:  67  TherEx: All exercises were completed with 4# ankle weight: CGA for stability and safety reasons Seated marches x 2 15 reps each LE Seated LAQ x 2 15 reps each LE Standing mini squats with BUE support of 2WW x 2 10 reps Standing heel raises with BUE support of 2WW x 2 10 reps  Standing hip flexion with BUE support of 2WW x 2 10 reps    Gait training: Close CGA, ~150 feet x 2 with emphasis on maintaining AD proximal to body   Sit to stands x 5 requiring constant verbal and tactile cues to reach back   Clinical Impression: Patient tolerated strengthening exercise with fair tolerance to activity. He requires constant supervision and verbal cues for exercise technique. He ambulated for 150 feet x 2 requiring a brief seated rest break due to muscle fatigue. Therapist provided constant verbal cues to maintain AD proximal to trunk for proper gait mechanics with fair follow through. Patient completed sit to stands requiring close CGA and constant verbal cues to reach back with BUE for proper descent in chair. Patient will continue to benefit from skilled physical therapy to improve generalized strength, ROM, and capacity for functional activity.       PT Short Term Goals - 08/21/20 0959      PT SHORT TERM GOAL #1   Title Patient will be independent in home exercise program to improve strength/mobility for better functional independence with ADLs.    Baseline 11/11: HEP given 12/21: HEP  compliant    Time 4    Period Weeks    Status Achieved    Target Date 08/09/20             PT Long Term Goals - 10/04/20 1235      PT LONG TERM GOAL #1   Title  Patient will increase FOTO score to equal to or greater than 51% to demonstrate statistically significant improvement in mobility and quality of life.    Baseline 11/11: 42% 12/21: 46.8% 2/3: 36%    Time 12    Period Weeks    Status Partially Met    Target Date 12/27/20      PT LONG TERM GOAL #2   Title Patient will demonstrate an improved Berg Balance Score of > 40/56 as to demonstrate improved balance with ADLs such as sitting/standing and transfer balance and reduced fall risk.    Baseline 11/11: 34/56 12/21: 37/56 2/3: 35/56    Time 12    Period Weeks    Status On-going    Target Date 12/27/20      PT LONG TERM GOAL #3   Title Patient (> 4 years old) will complete five times sit to stand test in < 15 seconds without UE support indicating an increased LE strength and improved balance    Baseline 11/11; 23.35 seconds with UE support 12/21: 27.85 with one near LOB 2/3: 12.6 seconds with heavy BUE support due to confusion    Time 12    Period Weeks    Status On-going    Target Date 12/27/20      PT LONG TERM GOAL #4   Title Patient will increase 10 meter walk test to >1.72m/s with LRAD  as to improve gait speed for better community ambulation and to reduce fall risk.    Baseline 11/11: 0.74 m/s without AD with one near LOB 12/21: 1.08 m/s    Time 12    Period Weeks    Status Achieved      PT LONG TERM GOAL #5   Title Patient will increase BLE gross strength to 4+/5 as to improve functional strength for independent gait, increased standing tolerance and increased ADL ability.    Baseline 11/11: see note 2/3: strength testing limited due to patient confusion    Time 12    Period Weeks    Status Partially Met    Target Date 12/27/20      PT LONG TERM GOAL #6   Title Patient will increase six minute walk test  distance to >1000 for progression to community ambulator and improve gait ability    Baseline 2/3: able to perform 4 minutes for 420 ft with rollator    Time 12    Period Weeks    Status New    Target Date 12/27/20                 Plan - 10/09/20 1028    Clinical Impression Statement Patient tolerated strengthening exercise with fair tolerance to activity. He requires constant supervision and verbal cues for exercise technique. He ambulated for 150 feet x 2 requiring a brief seated rest break due to muscle fatigue. Therapist provided constant verbal cues to maintain AD proximal to trunk for proper gait mechanics with fair follow through. Patient completed sit to stands requiring close CGA and constant verbal cues to reach back with BUE for proper descent in chair. Patient will continue to benefit from skilled physical therapy to improve generalized strength, ROM, and capacity for functional activity.    Personal Factors and Comorbidities Age;Comorbidity 3+;Fitness;Past/Current Experience;Sex;Social Background;Time since onset of injury/illness/exacerbation;Transportation    Comorbidities AAA, arthritis, atherosclerotic heart disease, (s/p CABG), benign neoplasm of ascending/descending colon, COPD, CAD, dementia, dysrhythmia, frequent falls, GERD, hemorrhoid, HOH, HTN, PAF, prostate cancer.    Examination-Activity Limitations Bathing;Bend;Carry;Dressing;Continence;Stairs;Squat;Reach Overhead;Locomotion  Level;Lift;Stand;Transfers;Toileting;Bed Mobility    Examination-Participation Restrictions Church;Cleaning;Community Activity;Driving;Interpersonal Relationship;Laundry;Volunteer;Shop;Personal Finances;Meal Prep;Yard Work    Product manager    Rehab Potential Fair    Clinical Impairments Affecting Rehab Potential (+) good support system, high prior level of function (-) currently undergoing radiation therapy for prostate cancer, memory deficits      PT Frequency 2x / week    PT Duration 12 weeks    PT Treatment/Interventions ADLs/Self Care Home Management;Aquatic Therapy;Cryotherapy;Moist Heat;Traction;Ultrasound;Therapeutic activities;Functional mobility training;Stair training;Gait training;DME Instruction;Therapeutic exercise;Balance training;Neuromuscular re-education;Patient/family education;Manual techniques;Passive range of motion;Energy conservation;Vestibular;Taping;Biofeedback;Electrical Stimulation;Iontophoresis 4mg /ml Dexamethasone;Dry needling;Visual/perceptual remediation/compensation;Cognitive remediation;Canalith Repostioning    PT Next Visit Plan balance, strength    PT Home Exercise Plan see above    Consulted and Agree with Plan of Care Patient;Family member/caregiver    Family Member Consulted aide           Patient will benefit from skilled therapeutic intervention in order to improve the following deficits and impairments:  Abnormal gait,Cardiopulmonary status limiting activity,Decreased activity tolerance,Decreased balance,Decreased knowledge of precautions,Decreased endurance,Decreased coordination,Decreased cognition,Decreased knowledge of use of DME,Decreased mobility,Difficulty walking,Decreased safety awareness,Decreased strength,Impaired flexibility,Impaired perceived functional ability,Impaired sensation,Postural dysfunction,Improper body mechanics,Pain,Impaired vision/preception,Decreased range of motion  Visit Diagnosis: Muscle weakness (generalized)  Unsteadiness on feet  Other abnormalities of gait and mobility  Chronic low back pain, unspecified back pain laterality, unspecified whether sciatica present  Difficulty in walking, not elsewhere classified     Problem List Patient Active Problem List   Diagnosis Date Noted  . AAA (abdominal aortic aneurysm) without rupture ([place]) 06/29/2020  . LBD (Lewy body dementia) ([place]) 05/14/2020  . Numbness and tingling in left hand 03/02/2020  . Hematuria    . Acute kidney injury superimposed on CKD ([place])   . Sepsis due to gram-negative UTI ([place]) 12/26/2019  . Left nephrolithiasis 12/26/2019  . Hydronephrosis, left 12/26/2019  . Status post abdominal aortic aneurysm (AAA) repair 04/15/2019  . Do not intubate, cardiopulmonary resuscitation (CPR)-only code status 10/08/2018  . Prostate cancer ([place]) 02/25/2018  . Prostatic cyst 01/15/2018  . Blood in stool   . Abnormal feces   . Stricture and stenosis of esophagus   . Mild cognitive impairment 08/18/2017  . Senile purpura ([place]) 04/29/2017  . Anemia 04/29/2017  . Frequent falls 04/29/2017  . Simple chronic bronchitis ([place]) 12/17/2016  . Benign localized hyperplasia of prostate with urinary obstruction 07/15/2016  . Elevated PSA 07/15/2016  . Incomplete emptying of bladder 07/15/2016  . Urge incontinence 07/15/2016  . Hypothyroidism 04/25/2016  . Macular degeneration 04/25/2016  . Erectile dysfunction due to arterial insufficiency   . Ischemic heart disease with chronotropic incompetence   . CN (constipation) 02/10/2015  . DD (diverticular disease) 02/10/2015  . Weakness 02/10/2015  . Lichen planus 09/81/1914  . Restless leg 02/10/2015  . 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 B12 deficiency 02/10/2015  . COPD (chronic obstructive pulmonary disease) ([place]) 11/14/2014  . Post Inflammatory Lung Changes 11/14/2014  . PAF (paroxysmal atrial fibrillation) (HCC) CHA2DS2-VASc = 4. AC = Eliquis   . Insomnia 12/09/2013  . OSA on CPAP   . Dyspnea on exertion - -essentially resolved with BB dose reduction & wgt loss 03/29/2013  . Overweight (BMI 25.0-29.9) -- Wgt back up 01/17/2013  . Hx of CABG   . Essential hypertension   . Hyperlipidemia with target LDL less than 70   . Aorto-iliac disease ([place])   . BCC (basal cell carcinoma), eyelid 01/03/2013  . Allergic rhinitis 08/17/2009  . Adaptation reaction 05/28/2009  .  Acid reflux 05/28/2009  . Arthritis, degenerative 05/28/2009  .  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 Carotid artery occlusion without infarction 06/01/2006  . PAD (peripheral artery disease) - bilateral common iliac stents 12/15/2005  . Atherosclerotic heart disease of artery bypass graft 09/01/1997   Karl Luke PT, DPT Netta Corrigan 10/09/2020, 10:28 AM  [place] MAIN [facility] SERVICES [address] [place], [place], [address] Phone: [phone]   Fax:  [phone]  Name: [name] MRN: [id number] Date of Birth: Jul 13, 1934

## 2020-10-10 ENCOUNTER — Ambulatory Visit (INDEPENDENT_AMBULATORY_CARE_PROVIDER_SITE_OTHER): Payer: Medicare Other | Admitting: Family Medicine

## 2020-10-10 VITALS — BP 131/79 | HR 68 | Temp 96.8°F | Ht 71.0 in | Wt 173.0 lb

## 2020-10-10 DIAGNOSIS — I951 Orthostatic hypotension: Secondary | ICD-10-CM | POA: Diagnosis not present

## 2020-10-10 DIAGNOSIS — I10 Essential (primary) hypertension: Secondary | ICD-10-CM

## 2020-10-10 DIAGNOSIS — F0280 Dementia in other diseases classified elsewhere without behavioral disturbance: Secondary | ICD-10-CM

## 2020-10-10 DIAGNOSIS — R29818 Other symptoms and signs involving the nervous system: Secondary | ICD-10-CM

## 2020-10-10 DIAGNOSIS — G3183 Dementia with Lewy bodies: Secondary | ICD-10-CM

## 2020-10-10 MED ORDER — METOPROLOL SUCCINATE ER 50 MG PO TB24: 25.0000 mg | ORAL_TABLET | Freq: Every day | ORAL | Status: DC

## 2020-10-10 NOTE — Patient Instructions (Addendum)
.   Please review the attached list of medications and notify my office if there are any errors.    Reduce metoprolol ER to 1/2 of a 50mg  tablet every day

## 2020-10-10 NOTE — Progress Notes (Signed)
Established patient visit   Patient: [name]   DOB: Jan 04, 1934   87 y.o. Male  MRN: [id number] Visit Date: 10/10/2020  Today's healthcare provider: Lelon Huh, MD   Chief Complaint  Patient presents with  . Hypertension   Subjective    HPI   Here to follow up for hypertension and dementia. His son is working on getting patient placed in memory care, hopefully at [facility]. He is followed by Dr. Manuella Ghazi for Lewy Body dementia and has had increasing difficulty with ADLs including dressing, bathing, and cutting up food. He is able to walk short distances but is usually mobile in wheelchair. He is working with physical therapist. Has had problems with labile blood pressure. He was seen at ER on 09-27-2020 with right leg weakness and speech changes concerning for CVA. CT head revealed old lacunar infarcts, chronic small vessel ischemia, but no acute event. He had orthostatics done revealing drop of BP from 143/73 to 113/88, but no increase in heart rate when changing from sitting to standing position. Has since heard from Dr. Manuella Ghazi and thought symptoms may be related to orthostasis and was considering adding florinef, but wanted to try cutting back on current blood pressure medication first to see if that would make a difference. He has already been taken off of tamsulosin, but continue metoprolol succinate 50 daily and lisinopril 2.5mg  daily.  Hypertension, follow-up  BP Readings from Last 3 Encounters:  10/10/20 131/79  09/27/20 (!) 168/94  08/25/20 (!) 188/79   Wt Readings from Last 3 Encounters:  10/10/20 173 lb (78.5 kg)  09/27/20 175 lb (79.4 kg)  08/01/20 172 lb (78 kg)     He was last seen for hypertension 4 months ago.  BP at that visit was 138/59. Management since that visit includes continuing current medications.  He reports excellent compliance with treatment. He is not having side effects.  He is following a Regular diet. He is exercising. He does not  smoke.  Use of agents associated with hypertension: none.   Outside blood pressures are  Ranging from 419-622 systolic and 29-798 diastolic   No chest pain No chest pressure  No palpitations No syncope  No dyspnea No orthopnea  No paroxysmal nocturnal dyspnea No lower extremity edema   Pertinent labs: Lab Results  Component Value Date   CHOL 119 12/24/2019   HDL 39 (L) 12/24/2019   LDLCALC 62 12/24/2019   TRIG 90 12/24/2019   CHOLHDL 3.1 12/24/2019   Lab Results  Component Value Date   NA 140 09/27/2020   K 4.3 09/27/2020   CREATININE 1.07 09/27/2020   GFRNONAA >60 09/27/2020   GFRAA 82 05/14/2020   GLUCOSE 64 (L) 09/27/2020     The ASCVD Risk score (Goff DC Jr., et al., 2013) failed to calculate for the following reasons:   The 2013 ASCVD risk score is only valid for ages 50 to 82   ---------------------------------------------------------------------------------------------------      Medications: Outpatient Medications Prior to Visit  Medication Sig  . acetaminophen (TYLENOL) 500 MG tablet Take 500-1,000 mg by mouth every 6 (six) hours as needed for mild pain or moderate pain.   . Ascorbic Acid (VITAMIN C) 1000 MG tablet Take 1,000 mg by mouth daily.  . Cyanocobalamin (VITAMIN B-12) 500 MCG SUBL Place 500 mcg under the tongue daily.   [name] ELIQUIS 5 MG TABS tablet TAKE ONE TABLET TWICE DAILY  . ezetimibe (ZETIA) 10 MG tablet TAKE 1 TABLET BY  MOUTH DAILY  . finasteride (PROSCAR) 5 MG tablet TAKE ONE TABLET BY MOUTH EVERY DAY  . fluticasone (FLONASE) 50 MCG/ACT nasal spray PLACE TWO SPRAYS IN BOTH NOSRTILS EVERY DAY AS NEEDED FOR ALLERGIES  . lisinopril (ZESTRIL) 2.5 MG tablet TAKE ONE TABLET BY MOUTH TWICE DAILY  . Magnesium Oxide 500 MG TABS Take 500 mg by mouth every evening.  . melatonin 3 MG TABS tablet Take 0.5 tablets (1.5 mg total) by mouth at bedtime.  . metoprolol succinate (TOPROL-XL) 50 MG 24 hr tablet TAKE ONE TABLET BY MOUTH EVERY DAY TAKE WITH OR  IMMEDIATELY FOLLOWING A MEAL  . Multiple Vitamins-Minerals (PRESERVISION AREDS 2 PO) Take 1 tablet by mouth 2 (two) times daily.   [name] MYRBETRIQ 50 MG TB24 tablet TAKE ONE TABLET BY MOUTH EVERY DAY  . pantoprazole (PROTONIX) 40 MG tablet TAKE ONE TABLET BY MOUTH EVERY DAY  . QUEtiapine (SEROQUEL) 25 MG tablet Take 12.5 mg by mouth.   . rosuvastatin (CRESTOR) 20 MG tablet TAKE ONE TABLET BY MOUTH EVERY DAY  . umeclidinium-vilanterol (ANORO ELLIPTA) 62.5-25 MCG/INH AEPB Inhale 1 puff into the lungs daily.   . nitroGLYCERIN (NITROSTAT) 0.4 MG SL tablet Place 1 tablet (0.4 mg total) under the tongue every 5 (five) minutes as needed for chest pain.   No facility-administered medications prior to visit.        Objective    BP 131/79 (BP Location: Left Arm, Patient Position: Sitting, Cuff Size: Large)   Pulse 68   Temp (!) 96.8 F (36 C) (Temporal)   Ht 5\' 11"  (1.803 m)   Wt 173 lb (78.5 kg)   BMI 24.13 kg/m     Physical Exam   General: Appearance:    Well developed, well nourished male sitting in wheel chair in no acute distress  Eyes:    PERRL, conjunctiva/corneas clear, EOM's intact       Lungs:     Clear to auscultation bilaterally, respirations unlabored  Heart:    Normal heart rate. Irregularly irregular rhythm. No murmurs, rubs, or gallops.   MS:   All extremities are intact.   Neurologic:   Awake, alert, oriented x 3. No apparent focal neurological           defect.         Assessment & Plan     1. Neurological deficit, transient No resolved. Note to have significant drop in BP upon standing at recent ER visit. Concern for transient cerebral flood flow reduction due to orthostasis. Is already off of tamsulosin. Dr. Manuella Ghazi suggested reducing his BP medication before trying medication for orthostasis. Will reduce metoprolol ER to 25mg  daily.   2. Orthostatic hypotension   3. Essential hypertension Reduce from 50mg  tablet daily to - metoprolol succinate (TOPROL-XL) 50 MG 24 hr  tablet; Take 0.5 tablets (25 mg total) by mouth daily. Take with or immediately following a meal.  4. Lewy body dementia without behavioral disturbance ([place]) Continue follow up per Dr. Manuella Ghazi. Family is working on placement in memory care. Forms left here to be completely.  Rx for manual wheelchair. He is going to see if physical therapy can get this ordered for him.  Follow up one month.        The entirety of the information documented in the History of Present Illness, Review of Systems and Physical Exam were personally obtained by me. Portions of this information were initially documented by the CMA and reviewed by me for thoroughness and accuracy.  Lelon Huh, MD  [facility] [phone] (phone) [phone] (fax)  [place]

## 2020-10-11 ENCOUNTER — Ambulatory Visit: Payer: Medicare Other

## 2020-10-11 ENCOUNTER — Other Ambulatory Visit: Payer: Self-pay

## 2020-10-11 DIAGNOSIS — R2681 Unsteadiness on feet: Secondary | ICD-10-CM | POA: Diagnosis not present

## 2020-10-11 DIAGNOSIS — M6281 Muscle weakness (generalized): Secondary | ICD-10-CM

## 2020-10-11 DIAGNOSIS — R2689 Other abnormalities of gait and mobility: Secondary | ICD-10-CM

## 2020-10-11 NOTE — Therapy (Signed)
[place] MAIN [facility] SERVICES [address] [place], [place], [address] Phone: [phone]   Fax:  [phone]  Physical Therapy Treatment  Patient Details  Name: [name] MRN: [id number] Date of Birth: Nov 22, 1933 Referring Provider (PT): Jennings Books K    Encounter Date: 10/11/2020   PT End of Session - 10/11/20 1420    Visit Number 22    Number of Visits 44    Date for PT Re-Evaluation 12/27/20    Authorization Type 1/10 PN 10/04/20    PT Start Time 0931    PT Stop Time 1015    PT Time Calculation (min) 44 min    Equipment Utilized During Treatment Gait belt    Activity Tolerance Patient tolerated treatment well    Behavior During Therapy [facility] for tasks assessed/performed           Past Medical History:  Diagnosis Date  . AAA (abdominal aortic aneurysm) ([place]) 05/2019   [place]ativley Rapid progression from June-Aug 2020 (4.6 - 5.4 cm): s/p EVAR (Dr. Lucky Cowboy [facility]): EVAR - 23 mm prox, 12 cm distal & x 18 cm length Gore Excluder Endoprosthesis Main body (via RFA distal to lowest Renal A); 53mm x 14 cm L Contralateral Limb for L Iliac - extended with 8 mm x 6cm LifeStar stent (post-dilated with 7 mm DEB).  Additional R Iliac PTA not required.   . 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 ([place]) 09/15/2017   09-15-2017 DIAGNOSIS:  A. COLON POLYP X 2, ASCENDING; COLD SNARE:  - TUBULAR ADENOMA.  - NEGATIVE FOR HIGH-GRADE DYSPLASIA AND MALIGNANCY.  - NO ADDITIONAL TISSUE IDENTIFIED; FECAL MATERIAL PRESENT.   B. COLON POLYP X 2, DESCENDING; COLD SNARE:  - ADENOCARCINOMA ARISING IN A DYSPLASTIC SESSILE SERRATED ADENOMA, WITH  LYMPHOVASCULAR INVASION, SEE COMMENT.  - TUBULAR ADENOMA, 2 FRAGMENTS, NEGATIVE FO  . Arthritis   . Basal cell carcinoma of eyelid 01/03/2013   2019 - R side of Nose  . Benign neoplasm of ascending colon   . Benign neoplasm of descending colon   . Bilateral iliac artery stenosis ([place]) 2007   Bilateral Iliac A stenting; extension of EVAR  limbs into both Iliacs -- additional stent placed in L Iliac.  . Cancer ([place])    Skin cancer  . Colon polyps   . Complication of anesthesia    severe confusion agitation requiring hospital admission x 4 days  . Compression fracture of fourth lumbar vertebra ([place]) 04/2019   - s/p Kyphoplasty; T12, L3-5 [facility])  . COPD (chronic obstructive pulmonary disease) (HCC)    Centrilobular Emphysema  . Coronary artery disease involving native coronary artery without angina pectoris 1998   - s/p CABG, occluded SVG-D1; patent LIMA-LAD, SVG-OM, SVG- RCA  . Dementia ([place])    memory loss - started noting late 2019  . Falls frequently    broke foot  . Fracture 2021   left foot  . GERD (gastroesophageal reflux disease)   . Hemorrhoid 02/10/2015  . History of hiatal hernia   . History of kidney stones   . HOH (hard of hearing)   . Hypertension   . Lewy body dementia ([place]) 04/04/2020  . Macular degeneration disease   . Osteoporosis   . PAF (paroxysmal atrial fibrillation) ([place])    on Eliquis for OAC - Rate Control   . Prostate cancer ([place])   . Prostate disorder   . Sciatic pain    Chronic  . Sleep apnea    cpap  .  Temporary cerebral vascular dysfunction 02/10/2015   Had negative work-up.  July, 2012.  No medication changes, had forgot them that day, got dehydrated.   . Urinary incontinence     Past Surgical History:  Procedure Laterality Date  . ABDOMINAL AORTIC ENDOVASCULAR STENT GRAFT Bilateral 05/18/2019   Procedure: ABDOMINAL AORTIC ENDOVASCULAR STENT GRAFT;  Surgeon: Algernon Huxley, MD;  Location: [facility] ORS;  Service: Vascular;  Laterality: Bilateral;  . ANKLE SURGERY Left    ORIF   . Arch aortogram and carotid aortogram  06/02/2006   Dr Oneida Alar did surgery  . BASAL CELL CARCINOMA EXCISION  04/2016   Dermatology  . CARDIAC CATHETERIZATION  01/30/1998    (Dr. Linard Millers): Native LAD & mRCA CTO. 90% D1. Mod Cx. SVG-D1 CTO. SVG-RCA & SVG-OM along with LIMA-LAD patent;  LV NORMAL Fxn  .  CAROTID ENDARTERECTOMY Left Oct. 29, 2007   Dr. Oneida Alar:  . CATARACT EXTRACTION W/PHACO Right 03/05/2017   Procedure: CATARACT EXTRACTION PHACO AND INTRAOCULAR LENS PLACEMENT (IOC);  Surgeon: Leandrew Koyanagi, MD;  Location: [facility] ORS;  Service: Ophthalmology;  Laterality: Right;  [place] 00:58.5 AP% 10.6 CDE 6.20 Fluid Pack lot # [id number]  . CATARACT EXTRACTION W/PHACO Left 04/15/2017   Procedure: CATARACT EXTRACTION PHACO AND INTRAOCULAR LENS PLACEMENT ([place])  Left;  Surgeon: Leandrew Koyanagi, MD;  Location: [place];  Service: Ophthalmology;  Laterality: Left;  . COLONOSCOPY WITH PROPOFOL N/A 09/15/2017   Procedure: COLONOSCOPY WITH PROPOFOL;  Surgeon: Lucilla Lame, MD;  Location: [facility] ENDOSCOPY;  Service: Endoscopy;  Laterality: N/A;  . COLONOSCOPY WITH PROPOFOL N/A 10/26/2018   Procedure: COLONOSCOPY WITH PROPOFOL;  Surgeon: Lucilla Lame, MD;  Location: [facility] ENDOSCOPY;  Service: Endoscopy;  Laterality: N/A;  . CORONARY ARTERY BYPASS GRAFT  1998   LIMA-LAD, SVG-OM, SVG-RPDA, SVG-DIAG  . CPET / MET  12/2012   Mild chronotropic incompetence - read 82% of predicted; also reduced effort; peak VO2 15.7 / 75% (did not reach Max effort) -- suggested ischemic response in last 1.5 minutes of exercise. Normal pulmonary function on PFTs but poor response to  . CYSTOSCOPY W/ RETROGRADES Left 01/13/2020   Procedure: CYSTOSCOPY WITH RETROGRADE PYELOGRAM;  Surgeon: Billey Co, MD;  Location: [facility] ORS;  Service: Urology;  Laterality: Left;  . CYSTOSCOPY WITH STENT PLACEMENT Left 12/26/2019   Procedure: CYSTOSCOPY WITH STENT PLACEMENT;  Surgeon: Billey Co, MD;  Location: [facility] ORS;  Service: Urology;  Laterality: Left;  . CYSTOSCOPY/URETEROSCOPY/HOLMIUM LASER/STENT PLACEMENT Left 01/13/2020   Procedure: CYSTOSCOPY/URETEROSCOPY/HOLMIUM LASER/STENT EXCHANGE;  Surgeon: Billey Co, MD;  Location: [facility] ORS;  Service: Urology;  Laterality: Left;  . ENDOVASCULAR REPAIR/STENT GRAFT N/A  05/18/2019   Procedure: ENDOVASCULAR REPAIR/STENT GRAFT;  Surgeon: Algernon Huxley, MD;  Location: [facility] INVASIVE CV LAB;; EVAR - 23 mm prox, 12 cm distal & x 18 cm length Gore Excluder Endoprosthesis Main body (via RFA distal to lowest Renal A); 6m x 14 cm L Contralateral Limb for L Iliac - extended with 8 mm x 6cm LifeStar stent; no additional R Iliac PTA needed.   . ESOPHAGOGASTRODUODENOSCOPY (EGD) WITH PROPOFOL N/A 09/15/2017   Procedure: ESOPHAGOGASTRODUODENOSCOPY (EGD) WITH PROPOFOL;  Surgeon: WLucilla Lame MD;  [facility];  Service: Endoscopy;  Laterality: N/A;  . FRACTURE SURGERY Right 03/19/2015   wrist  . FRACTURE SURGERY Left   . HIATAL HERNIA REPAIR    . ILIAC ARTERY STENT  12/15/2005   PTA and direct stenting rgt and lft common iliac arteries  . KYPHOPLASTY N/A 04/05/2019  Procedure: KYPHOPLASTY T12, L3, L4 L5;  Surgeon: Kennedy Bucker, MD;  Location: [facility] ORS;  Service: Orthopedics;  Laterality: N/A;  . NM GATED MYOVIEW ([facility] HX)  05/'19; 8/'20   a) [facility]: Low Risk - no ischemia or infarct.  EF > 65%. no RWMA;; b) [facility]): Normal wall motion.  No ischemia or infarction.  . OPEN REDUCTION INTERNAL FIXATION (ORIF) DISTAL RADIAL FRACTURE Left 01/13/2019   Procedure: OPEN REDUCTION INTERNAL FIXATION (ORIF) DISTAL  RADIAL FRACTURE - LEFT - SLEEP APNEA;  Surgeon: Kennedy Bucker, MD;  Location: [facility] ORS;  Service: Orthopedics;  Laterality: Left;  . ORIF ELBOW FRACTURE Right 01/01/2017   Procedure: OPEN REDUCTION INTERNAL FIXATION (ORIF) ELBOW/OLECRANON FRACTURE;  Surgeon: Kennedy Bucker, MD;  Location: [facility] ORS;  Service: Orthopedics;  Laterality: Right;  . ORIF WRIST FRACTURE Right 03/19/2015   Procedure: OPEN REDUCTION INTERNAL FIXATION (ORIF) WRIST FRACTURE;  Surgeon: Kennedy Bucker, MD;  Location: [facility] ORS;  Service: Orthopedics;  Laterality: Right;  . SHOULDER ARTHROSCOPY Right   . THORACIC AORTA - CAROTID ANGIOGRAM  October 2007   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 TRANSTHORACIC ECHOCARDIOGRAM  February 2016   [facility]: Normal LV function. Dilated left atrium.  Krystal Clark ECHOCARDIOGRAM  04/2019   [facility] April 20, 2019): EF 55-60 %.  Mild LVH.  Relatively normal valves.    There were no vitals filed for this visit.   Subjective Assessment - 10/11/20 1417    Subjective Patient went to doctor yesterday and had changes to his blood pressure medication. He presents with his aide.    Pertinent History Patient is returning to PT for evaluation of balance. Patient has been seen by this therapist in the past. PMH AAA, arthritis, basal cell carcinoma of eyelid, benign neoplasm of ascending/descending colon, bilateral iliac artery stenosis, colon polyps, compression fx of 4th lumbar vertebra (s/p kyphoplasty T12, L3-5), COPD, CAD, dementia, falls,  GERD, hemorroids, HOH, HTN, Lewy Body Dementia, macular degeneration disease, osteoporosis, PAF, prostate cancer, prostate disorder, sciatic pain, sleep apnea, temporary cerebral vascular dysfunction. Comes with aide, having multiple near falls per day per aide, mainly to the sides.    Limitations Walking;Standing;House hold activities;Other (comment);Lifting;Sitting    How long can you sit comfortably? n/a back pain no longer limiting    How long can you stand comfortably? 10-12 with walker    How long can you walk comfortably? walks 15 minutes with a walker    Diagnostic tests MRI from [facility] dated 03/28/2019 report was reviewed today. Acute compression fractures are noted at T12, L3, and L4. He has facet arthropathy at the lower lumbar levels. At L3-4 he has moderate central stenosis.    Patient Stated Goals walk without stumbling, be able to be steady enough to not have to have someone holding his gait belt.    Currently in Pain? No/denies          Patient hasn't been doing exercise much  at home due to BP and confusion. Went to doctor yesterday.   vitals at start of session 110/63   Treatment: Ambulate 160 ft with RW, min A for negotiation of obstacles with RW, close CGA due to instability. patient confused with sequencing and poor safety awareness.  BP after ambulation: 131/74   By stairs:  6 " step: toe taps 8x each LE; BUE support, max cueing for sequencing 4lb ankle weight: 6" step up/down. Very  confused attempted two steps at once very unsafe patterning requiring termination   In // bars:  4lb ankle weight: -high knee march 4x length of // bars; visual cueing required -side stepping 4x length of // bars, cues for neutral hip alignment for optimal lateral muscle recruitment.  -grapevine 4x length of // bars ; more challenged with RLE than LLE with confused patterning.  -hamstring curls 10x each LE, BUE support  Stand and reach for ball, throw into hoop x 15 balls  [place] hurdle; step over and back 10x, BUE support, challenging with spatial awareness of RLE  Diona Foley toss with patient and aide ; one near LOB, multiple challenges with spatial awareness to catch ball x 3 minutes    Patient had episode of confusion during session and demonstrated poor safety awareness requiring max cueing for sequencing of task and close CGA. Cognition appears to be primary source of instability this session however weakness does continue to contribute at this time. Patient will continue to benefit from skilled physical therapy to improve generalized strength, ROM, and capacity for functional activity.                         PT Education - 10/11/20 1419    Education provided Yes    Education Details exercise technique, body mechanics, safety awareness    Person(s) Educated Patient    Methods Explanation;Demonstration;Tactile cues;Verbal cues    Comprehension Verbalized understanding;Returned demonstration;Verbal cues required;Tactile cues required             PT Short Term Goals - 08/21/20 0959      PT SHORT TERM GOAL #1   Title Patient will be independent in home exercise program to improve strength/mobility for better functional independence with ADLs.    Baseline 11/11: HEP given 12/21: HEP compliant    Time 4    Period Weeks    Status Achieved    Target Date 08/09/20             PT Long Term Goals - 10/04/20 1235      PT LONG TERM GOAL #1   Title Patient will increase FOTO score to equal to or greater than 51% to demonstrate statistically significant improvement in mobility and quality of life.    Baseline 11/11: 42% 12/21: 46.8% 2/3: 36%    Time 12    Period Weeks    Status Partially Met    Target Date 12/27/20      PT LONG TERM GOAL #2   Title Patient will demonstrate an improved Berg Balance Score of > 40/56 as to demonstrate improved balance with ADLs such as sitting/standing and transfer balance and reduced fall risk.    Baseline 11/11: 34/56 12/21: 37/56 2/3: 35/56    Time 12    Period Weeks    Status On-going    Target Date 12/27/20      PT LONG TERM GOAL #3   Title Patient (> 62 years old) will complete five times sit to stand test in < 15 seconds without UE support indicating an increased LE strength and improved balance    Baseline 11/11; 23.35 seconds with UE support 12/21: 27.85 with one near LOB 2/3: 12.6 seconds with heavy BUE support due to confusion    Time 12    Period Weeks    Status On-going    Target Date 12/27/20      PT LONG TERM GOAL #4   Title Patient will increase 10  meter walk test to >1.99m/s with LRAD  as to improve gait speed for better community ambulation and to reduce fall risk.    Baseline 11/11: 0.74 m/s without AD with one near LOB 12/21: 1.08 m/s    Time 12    Period Weeks    Status Achieved      PT LONG TERM GOAL #5   Title Patient will increase BLE gross strength to 4+/5 as to improve functional strength for independent gait, increased standing tolerance and increased ADL ability.     Baseline 11/11: see note 2/3: strength testing limited due to patient confusion    Time 12    Period Weeks    Status Partially Met    Target Date 12/27/20      PT LONG TERM GOAL #6   Title Patient will increase six minute walk test distance to >1000 for progression to community ambulator and improve gait ability    Baseline 2/3: able to perform 4 minutes for 420 ft with rollator    Time 12    Period Weeks    Status New    Target Date 12/27/20                 Plan - 10/11/20 1423    Clinical Impression Statement Patient had episode of confusion during session and demonstrated poor safety awareness requiring max cueing for sequencing of task and close CGA. Cognition appears to be primary source of instability this session however weakness does continue to contribute at this time. Patient will continue to benefit from skilled physical therapy to improve generalized strength, ROM, and capacity for functional activity.    Personal Factors and Comorbidities Age;Comorbidity 3+;Fitness;Past/Current Experience;Sex;Social Background;Time since onset of injury/illness/exacerbation;Transportation    Comorbidities AAA, arthritis, atherosclerotic heart disease, (s/p CABG), benign neoplasm of ascending/descending colon, COPD, CAD, dementia, dysrhythmia, frequent falls, GERD, hemorrhoid, HOH, HTN, PAF, prostate cancer.    Examination-Activity Limitations Bathing;Bend;Carry;Dressing;Continence;Stairs;Squat;Reach Overhead;Locomotion Level;Lift;Stand;Transfers;Toileting;Bed Mobility    Examination-Participation Restrictions Church;Cleaning;Community Activity;Driving;Interpersonal Relationship;Laundry;Volunteer;Shop;Personal Finances;Meal Prep;Yard Work    Product manager    Rehab Potential Fair    Clinical Impairments Affecting Rehab Potential (+) good support system, high prior level of function (-) currently undergoing radiation therapy for prostate  cancer, memory deficits     PT Frequency 2x / week    PT Duration 12 weeks    PT Treatment/Interventions ADLs/Self Care Home Management;Aquatic Therapy;Cryotherapy;Moist Heat;Traction;Ultrasound;Therapeutic activities;Functional mobility training;Stair training;Gait training;DME Instruction;Therapeutic exercise;Balance training;Neuromuscular re-education;Patient/family education;Manual techniques;Passive range of motion;Energy conservation;Vestibular;Taping;Biofeedback;Electrical Stimulation;Iontophoresis 4mg /ml Dexamethasone;Dry needling;Visual/perceptual remediation/compensation;Cognitive remediation;Canalith Repostioning    PT Next Visit Plan balance, strength    PT Home Exercise Plan see above    Consulted and Agree with Plan of Care Patient;Family member/caregiver    Family Member Consulted aide           Patient will benefit from skilled therapeutic intervention in order to improve the following deficits and impairments:  Abnormal gait,Cardiopulmonary status limiting activity,Decreased activity tolerance,Decreased balance,Decreased knowledge of precautions,Decreased endurance,Decreased coordination,Decreased cognition,Decreased knowledge of use of DME,Decreased mobility,Difficulty walking,Decreased safety awareness,Decreased strength,Impaired flexibility,Impaired perceived functional ability,Impaired sensation,Postural dysfunction,Improper body mechanics,Pain,Impaired vision/preception,Decreased range of motion  Visit Diagnosis: Muscle weakness (generalized)  Unsteadiness on feet  Other abnormalities of gait and mobility     Problem List Patient Active Problem List   Diagnosis Date Noted  . AAA (abdominal aortic aneurysm) without rupture ([place]) 06/29/2020  . LBD (Lewy body dementia) ([place]) 05/14/2020  . Numbness and tingling in left hand 03/02/2020  . Hematuria   .  Acute kidney injury superimposed on CKD ([place])   . Sepsis due to gram-negative UTI ([place]) 12/26/2019  . Left  nephrolithiasis 12/26/2019  . Hydronephrosis, left 12/26/2019  . Status post abdominal aortic aneurysm (AAA) repair 04/15/2019  . Do not intubate, cardiopulmonary resuscitation (CPR)-only code status 10/08/2018  . Prostate cancer ([place]) 02/25/2018  . Prostatic cyst 01/15/2018  . Blood in stool   . Abnormal feces   . Stricture and stenosis of esophagus   . Mild cognitive impairment 08/18/2017  . Senile purpura ([place]) 04/29/2017  . Anemia 04/29/2017  . Frequent falls 04/29/2017  . Simple chronic bronchitis ([place]) 12/17/2016  . Benign localized hyperplasia of prostate with urinary obstruction 07/15/2016  . Elevated PSA 07/15/2016  . Incomplete emptying of bladder 07/15/2016  . Urge incontinence 07/15/2016  . Hypothyroidism 04/25/2016  . Macular degeneration 04/25/2016  . Erectile dysfunction due to arterial insufficiency   . Ischemic heart disease with chronotropic incompetence   . CN (constipation) 02/10/2015  . DD (diverticular disease) 02/10/2015  . Weakness 02/10/2015  . Lichen planus 35/68/6168  . Restless leg 02/10/2015  . 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 B12 deficiency 02/10/2015  . COPD (chronic obstructive pulmonary disease) ([place]) 11/14/2014  . Post Inflammatory Lung Changes 11/14/2014  . PAF (paroxysmal atrial fibrillation) (HCC) CHA2DS2-VASc = 4. AC = Eliquis   . Insomnia 12/09/2013  . OSA on CPAP   . Dyspnea on exertion - -essentially resolved with BB dose reduction & wgt loss 03/29/2013  . Overweight (BMI 25.0-29.9) -- Wgt back up 01/17/2013  . Hx of CABG   . Essential hypertension   . Hyperlipidemia with target LDL less than 70   . Aorto-iliac disease ([place])   . BCC (basal cell carcinoma), eyelid 01/03/2013  . Allergic rhinitis 08/17/2009  . Adaptation reaction 05/28/2009  . Acid reflux 05/28/2009  . Arthritis, degenerative 05/28/2009  . 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 Carotid artery occlusion without infarction 06/01/2006  . PAD  (peripheral artery disease) - bilateral common iliac stents 12/15/2005  . Atherosclerotic heart disease of artery bypass graft 09/01/1997   Janna Arch, PT, DPT   10/11/2020, 2:24 PM  [place] MAIN [facility] SERVICES [address] [place], [place], [address] Phone: [phone]   Fax:  [phone]  Name: [name] MRN: [id number] Date of Birth: Feb 01, 1934

## 2020-10-15 ENCOUNTER — Encounter: Payer: Self-pay | Admitting: Family Medicine

## 2020-10-16 ENCOUNTER — Other Ambulatory Visit: Payer: Medicare Other | Admitting: Adult Health Nurse Practitioner

## 2020-10-16 ENCOUNTER — Ambulatory Visit: Payer: Medicare Other

## 2020-10-16 ENCOUNTER — Other Ambulatory Visit: Payer: Self-pay

## 2020-10-16 DIAGNOSIS — R2689 Other abnormalities of gait and mobility: Secondary | ICD-10-CM

## 2020-10-16 DIAGNOSIS — M6281 Muscle weakness (generalized): Secondary | ICD-10-CM

## 2020-10-16 DIAGNOSIS — F0280 Dementia in other diseases classified elsewhere without behavioral disturbance: Secondary | ICD-10-CM

## 2020-10-16 DIAGNOSIS — R2681 Unsteadiness on feet: Secondary | ICD-10-CM

## 2020-10-16 DIAGNOSIS — Z515 Encounter for palliative care: Secondary | ICD-10-CM

## 2020-10-16 NOTE — Progress Notes (Signed)
[facility] Consult Note Telephone: [phone]  Fax: [phone]  PATIENT NAME: [name] DOB: 1934-04-26 MRN: [id number]  PRIMARY CARE PROVIDER:   [name], MD  REFERRING PROVIDER:  [name], MD [address] [place],  [place] [address]  RESPONSIBLE PARTY:   Londyn Wotton, son [phone] [email or web address] [name], SO C: [phone] H: [phone]  Chief complaint: Follow-up palliative visit for complex medical decision making  Son, significant other, and [facility] caregiver present during visit today  RECOMMENDATIONS and PLAN:  1.  Advanced care planning. Patient is DNR.  2.  Lewy body dementia.  FAST 6b.  No changes in functional status.  Discussed that with dementia patients that changing his environment may be traumatic and he may have some anxiety/agitation at first.  Family intends to continue caregivers at the facility at [facility] so he has someone familiar with him.  This is a good idea and encouraged. Family would like to continue palliative at facility.  Instructed to let me know either by phone call or MyChart message when he moves in so that palliative can continue and we can monitor for any symptom management with the move.    Palliative will continue to monitor for symptom management/decline and make recommendations as needed.  Family encouraged to call with any questions or concerns  I spent 40 minutes providing this consultation, including time with patient/family, provider coordination, chart review including latest labs and imaging, documentation. More than 50% of the time in this consultation was spent coordinating communication.   HISTORY OF PRESENT ILLNESS:  [name] is a 87 y.o. 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 fib, CAD status post CABG x4, OSA on CPAP, COPD, HTN. Palliative  Care was asked to help address goals of care. Patient had ER visit 08/25/20 for skin tears to right hand due to him being agitated and trying to take car keys from his son.  These have since healed.  Patient does have bandaging to right hand today.  SO states that he had surgery for trigger finger yesterday.  He has not complained of pain.  Was evaluated in ER on 09/27/20 for hypotension and right sided weakness.  CT was negative for any acute findings.  Orthostatic blood pressures were negative.  Has not had any more episodes.  Family and caregiver do state that he can be restless at night but only gets out of bed to go to the bathroom.  He does have hallucinations in which he sees people that others do not see. These do not seem to frighten him.  He does sleep better with seroquel 25 mg QHS.  Family states that he is unsteady on his feet and without standby assist and the gait belt that he would have more falls than he does.  Appetite is good with no reported weight loss.  Son has found him placement at [facility] and is set to move in 2-3 weeks.    CODE STATUS: DNR  PPS: 50% HOSPICE ELIGIBILITY/DIAGNOSIS: TBD  PHYSICAL EXAM:  BP 112/58  HR 58  O2 99% on RA General: NAD, frail appearing, thin Eyes: Anicteric and noninjected with no discharge noted ENMT: Moist mucous membranes Cardiovascular: regular rate and rhythm Pulmonary:Lung sounds clear; normal respiratory effort Abdomen: soft, nontender, + bowel sounds Extremities: no edema, no joint deformities Skin: no rasheson exposed skin Neurological: Weakness; A&Oto person  and place   PAST MEDICAL HISTORY:  Past Medical History:  Diagnosis Date  . AAA (abdominal aortic aneurysm) ([facility]) 05/2019   Relativley Rapid progression from June-Aug 2020 (4.6 - 5.4 cm): s/p EVAR (Dr. Lucky Cowboy [facility]): EVAR - 23 mm prox, 12 cm distal & x 18 cm length Gore Excluder Endoprosthesis Main body (via RFA distal to lowest Renal A); 23mm x 14 cm L  Contralateral Limb for L Iliac - extended with 8 mm x 6cm LifeStar stent (post-dilated with 7 mm DEB).  Additional R Iliac PTA not required.   . 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 ([facility]) 09/15/2017   09-15-2017 DIAGNOSIS:  A. COLON POLYP X 2, ASCENDING; COLD SNARE:  - TUBULAR ADENOMA.  - NEGATIVE FOR HIGH-GRADE DYSPLASIA AND MALIGNANCY.  - NO ADDITIONAL TISSUE IDENTIFIED; FECAL MATERIAL PRESENT.   B. COLON POLYP X 2, DESCENDING; COLD SNARE:  - ADENOCARCINOMA ARISING IN A DYSPLASTIC SESSILE SERRATED ADENOMA, WITH  LYMPHOVASCULAR INVASION, SEE COMMENT.  - TUBULAR ADENOMA, 2 FRAGMENTS, NEGATIVE FO  . Arthritis   . Basal cell carcinoma of eyelid 01/03/2013   2019 - R side of Nose  . Benign neoplasm of ascending colon   . Benign neoplasm of descending colon   . Bilateral iliac artery stenosis ([facility]) 2007   Bilateral Iliac A stenting; extension of EVAR limbs into both Iliacs -- additional stent placed in L Iliac.  . Cancer ([place])    Skin cancer  . Colon polyps   . Complication of anesthesia    severe confusion agitation requiring hospital admission x 4 days  . Compression fracture of fourth lumbar vertebra ([facility]) 04/2019   - s/p Kyphoplasty; T12, L3-5 [facility])  . COPD (chronic obstructive pulmonary disease) ([facility])    Centrilobular Emphysema  . Coronary artery disease involving native coronary artery without angina pectoris 1998   - s/p CABG, occluded SVG-D1; patent LIMA-LAD, SVG-OM, SVG- RCA  . Dementia ([facility])    memory loss - started noting late 2019  . Falls frequently    broke foot  . Fracture 2021   left foot  . GERD (gastroesophageal reflux disease)   . Hemorrhoid 02/10/2015  . History of hiatal hernia   . History of kidney stones   . HOH (hard of hearing)   . Hypertension   . Lewy body dementia ([place] [facility]) 04/04/2020  . Macular degeneration disease   . Osteoporosis   . PAF (paroxysmal atrial fibrillation) ([facility])    on Eliquis for OAC - Rate Control   . Prostate cancer ([facility])   . Prostate disorder    . Sciatic pain    Chronic  . Sleep apnea    cpap  . Temporary cerebral vascular dysfunction 02/10/2015   Had negative work-up.  July, 2012.  No medication changes, had forgot them that day, got dehydrated.   . Urinary incontinence     SOCIAL HX:  Social History   Tobacco Use  . Smoking status: Former Smoker    Types: Cigarettes    Quit date: 08/04/1997    Years since quitting: 23.2  . Smokeless tobacco: Never Used  Substance Use Topics  . Alcohol use: No    ALLERGIES:  Allergies  Allergen Reactions  . Clonazepam Other (See Comments)    Altered mental status  . Codeine Other (See Comments)    Altered mental status.  . Ropinirole Swelling    Other reaction(s): Hallucination  . Hydrocodone     confusion  . Niacin And Related Other (See Comments)  Flushing of skin  . Oxycodone Other (See Comments)    confusion confusion  . Tramadol     Other reaction(s): Hallucination     PERTINENT MEDICATIONS:  Outpatient Encounter Medications as of 10/16/2020  Medication Sig  . acetaminophen (TYLENOL) 500 MG tablet Take 500-1,000 mg by mouth every 6 (six) hours as needed for mild pain or moderate pain.   [name] ELIQUIS 5 MG TABS tablet TAKE ONE TABLET TWICE DAILY  . ezetimibe (ZETIA) 10 MG tablet TAKE 1 TABLET BY MOUTH DAILY  . finasteride (PROSCAR) 5 MG tablet TAKE ONE TABLET BY MOUTH EVERY DAY  . fluticasone (FLONASE) 50 MCG/ACT nasal spray PLACE TWO SPRAYS IN BOTH NOSRTILS EVERY DAY AS NEEDED FOR ALLERGIES  . lisinopril (ZESTRIL) 2.5 MG tablet TAKE ONE TABLET BY MOUTH TWICE DAILY  . Magnesium Oxide 500 MG TABS Take 500 mg by mouth every evening.  . melatonin 3 MG TABS tablet Take 0.5 tablets (1.5 mg total) by mouth at bedtime.  . metoprolol succinate (TOPROL-XL) 50 MG 24 hr tablet Take 0.5 tablets (25 mg total) by mouth daily. Take with or immediately following a meal.  . Multiple Vitamins-Minerals (PRESERVISION AREDS 2 PO) Take 1 tablet by mouth 2 (two) times daily.   [name] MYRBETRIQ 50  MG TB24 tablet TAKE ONE TABLET BY MOUTH EVERY DAY  . nitroGLYCERIN (NITROSTAT) 0.4 MG SL tablet Place 1 tablet (0.4 mg total) under the tongue every 5 (five) minutes as needed for chest pain.  . pantoprazole (PROTONIX) 40 MG tablet TAKE ONE TABLET BY MOUTH EVERY DAY  . QUEtiapine (SEROQUEL) 25 MG tablet Take 12.5 mg by mouth.   . rosuvastatin (CRESTOR) 20 MG tablet TAKE ONE TABLET BY MOUTH EVERY DAY  . umeclidinium-vilanterol (ANORO ELLIPTA) 62.5-25 MCG/INH AEPB Inhale 1 puff into the lungs daily.    No facility-administered encounter medications on file as of 10/16/2020.     Yui Mulvaney Jenetta Downer, NP

## 2020-10-16 NOTE — Therapy (Signed)
[place] MAIN [facility] SERVICES [address] [place], [place], [address] Phone: [phone]   Fax:  [phone]  Physical Therapy Treatment  Patient Details  Name: [name] MRN: [id number] Date of Birth: [birth year] Referring Provider (PT): Jennings Books K    Encounter Date: 10/16/2020   PT End of Session - 10/16/20 0942    Visit Number 23    Number of Visits 44    Date for PT Re-Evaluation 12/27/20    Authorization Type 3/10 PN 10/04/20    PT Start Time 0930    PT Stop Time 1016    PT Time Calculation (min) 46 min    Equipment Utilized During Treatment Gait belt    Activity Tolerance Patient tolerated treatment well    Behavior During Therapy [facility] for tasks assessed/performed           Past Medical History:  Diagnosis Date  . AAA (abdominal aortic aneurysm) ([place]) 05/2019   Relativley Rapid progression from June-Aug 2020 (4.6 - 5.4 cm): s/p EVAR (Dr. [facility]): EVAR - 23 mm prox, 12 cm distal & x 18 cm length Gore Excluder Endoprosthesis Main body (via RFA distal to lowest Renal A); 36mm x 14 cm L Contralateral Limb for L Iliac - extended with 8 mm x 6cm LifeStar stent (post-dilated with 7 mm DEB).  Additional R Iliac PTA not required.   . 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 ([place]) 09/15/2017   09-15-2017 DIAGNOSIS:  A. COLON POLYP X 2, ASCENDING; COLD SNARE:  - TUBULAR ADENOMA.  - NEGATIVE FOR HIGH-GRADE DYSPLASIA AND MALIGNANCY.  - NO ADDITIONAL TISSUE IDENTIFIED; FECAL MATERIAL PRESENT.   B. COLON POLYP X 2, DESCENDING; COLD SNARE:  - ADENOCARCINOMA ARISING IN A DYSPLASTIC SESSILE SERRATED ADENOMA, WITH  LYMPHOVASCULAR INVASION, SEE COMMENT.  - TUBULAR ADENOMA, 2 FRAGMENTS, NEGATIVE FO  . Arthritis   . Basal cell carcinoma of eyelid 01/03/2013   2019 - R side of Nose  . Benign neoplasm of ascending colon   . Benign neoplasm of descending colon   . Bilateral iliac artery stenosis ([place]) 2007   Bilateral Iliac A stenting; extension of EVAR  limbs into both Iliacs -- additional stent placed in L Iliac.  . Cancer ([place])    Skin cancer  . Colon polyps   . Complication of anesthesia    severe confusion agitation requiring hospital admission x 4 days  . Compression fracture of fourth lumbar vertebra ([place]) 04/2019   - s/p Kyphoplasty; T12, L3-5 [facility])  . COPD (chronic obstructive pulmonary disease) (HCC)    Centrilobular Emphysema  . Coronary artery disease involving native coronary artery without angina pectoris 1998   - s/p CABG, occluded SVG-D1; patent LIMA-LAD, SVG-OM, SVG- RCA  . Dementia ([place])    memory loss - started noting late 2019  . Falls frequently    broke foot  . Fracture 2021   left foot  . GERD (gastroesophageal reflux disease)   . Hemorrhoid 02/10/2015  . History of hiatal hernia   . History of kidney stones   . HOH (hard of hearing)   . Hypertension   . Lewy body dementia ([place]) 04/04/2020  . Macular degeneration disease   . Osteoporosis   . PAF (paroxysmal atrial fibrillation) ([place])    on Eliquis for OAC - Rate Control   . Prostate cancer ([place])   . Prostate disorder   . Sciatic pain    Chronic  . Sleep apnea    cpap  .  Temporary cerebral vascular dysfunction 02/10/2015   Had negative work-up.  July, 2012.  No medication changes, had forgot them that day, got dehydrated.   . Urinary incontinence     Past Surgical History:  Procedure Laterality Date  . ABDOMINAL AORTIC ENDOVASCULAR STENT GRAFT Bilateral 05/18/2019   Procedure: ABDOMINAL AORTIC ENDOVASCULAR STENT GRAFT;  Surgeon: Algernon Huxley, MD;  Location: [facility] ORS;  Service: Vascular;  Laterality: Bilateral;  . ANKLE SURGERY Left    ORIF   . Arch aortogram and carotid aortogram  06/02/2006   Dr Oneida Alar did surgery  . BASAL CELL CARCINOMA EXCISION  04/2016   Dermatology  . CARDIAC CATHETERIZATION  01/30/1998    (Dr. Linard Millers): Native LAD & mRCA CTO. 90% D1. Mod Cx. SVG-D1 CTO. SVG-RCA & SVG-OM along with LIMA-LAD patent;  LV NORMAL Fxn  .  CAROTID ENDARTERECTOMY Left Oct. 29, 2007   Dr. Oneida Alar:  . CATARACT EXTRACTION W/PHACO Right 03/05/2017   Procedure: CATARACT EXTRACTION PHACO AND INTRAOCULAR LENS PLACEMENT (IOC);  Surgeon: Leandrew Koyanagi, MD;  Location: [facility] ORS;  Service: Ophthalmology;  Laterality: Right;  [place] 00:58.5 AP% 10.6 CDE 6.20 Fluid Pack lot # [id number]  . CATARACT EXTRACTION W/PHACO Left 04/15/2017   Procedure: CATARACT EXTRACTION PHACO AND INTRAOCULAR LENS PLACEMENT ([place])  Left;  Surgeon: Leandrew Koyanagi, MD;  Location: [place];  Service: Ophthalmology;  Laterality: Left;  . COLONOSCOPY WITH PROPOFOL N/A 09/15/2017   Procedure: COLONOSCOPY WITH PROPOFOL;  Surgeon: Lucilla Lame, MD;  Location: [facility] ENDOSCOPY;  Service: Endoscopy;  Laterality: N/A;  . COLONOSCOPY WITH PROPOFOL N/A 10/26/2018   Procedure: COLONOSCOPY WITH PROPOFOL;  Surgeon: Lucilla Lame, MD;  Location: [facility] ENDOSCOPY;  Service: Endoscopy;  Laterality: N/A;  . CORONARY ARTERY BYPASS GRAFT  1998   LIMA-LAD, SVG-OM, SVG-RPDA, SVG-DIAG  . CPET / MET  12/2012   Mild chronotropic incompetence - read 82% of predicted; also reduced effort; peak VO2 15.7 / 75% (did not reach Max effort) -- suggested ischemic response in last 1.5 minutes of exercise. Normal pulmonary function on PFTs but poor response to  . CYSTOSCOPY W/ RETROGRADES Left 01/13/2020   Procedure: CYSTOSCOPY WITH RETROGRADE PYELOGRAM;  Surgeon: Billey Co, MD;  Location: [facility] ORS;  Service: Urology;  Laterality: Left;  . CYSTOSCOPY WITH STENT PLACEMENT Left 12/26/2019   Procedure: CYSTOSCOPY WITH STENT PLACEMENT;  Surgeon: Billey Co, MD;  Location: [facility] ORS;  Service: Urology;  Laterality: Left;  . CYSTOSCOPY/URETEROSCOPY/HOLMIUM LASER/STENT PLACEMENT Left 01/13/2020   Procedure: CYSTOSCOPY/URETEROSCOPY/HOLMIUM LASER/STENT EXCHANGE;  Surgeon: Billey Co, MD;  Location: [facility] ORS;  Service: Urology;  Laterality: Left;  . ENDOVASCULAR REPAIR/STENT GRAFT N/A  05/18/2019   Procedure: ENDOVASCULAR REPAIR/STENT GRAFT;  Surgeon: Algernon Huxley, MD;  Location: [facility] INVASIVE CV LAB;; EVAR - 23 mm prox, 12 cm distal & x 18 cm length Gore Excluder Endoprosthesis Main body (via RFA distal to lowest Renal A); 6m x 14 cm L Contralateral Limb for L Iliac - extended with 8 mm x 6cm LifeStar stent; no additional R Iliac PTA needed.   . ESOPHAGOGASTRODUODENOSCOPY (EGD) WITH PROPOFOL N/A 09/15/2017   Procedure: ESOPHAGOGASTRODUODENOSCOPY (EGD) WITH PROPOFOL;  Surgeon: WLucilla Lame MD;  [facility];  Service: Endoscopy;  Laterality: N/A;  . FRACTURE SURGERY Right 03/19/2015   wrist  . FRACTURE SURGERY Left   . HIATAL HERNIA REPAIR    . ILIAC ARTERY STENT  12/15/2005   PTA and direct stenting rgt and lft common iliac arteries  . KYPHOPLASTY N/A 04/05/2019  Procedure: KYPHOPLASTY T12, L3, L4 L5;  Surgeon: Hessie Knows, MD;  Location: [facility] ORS;  Service: Orthopedics;  Laterality: N/A;  . NM GATED MYOVIEW ([place] [facility])  05/'19; 8/'20   a) [facility]: Low Risk - no ischemia or infarct.  EF > 65%. no RWMA;; b) [facility]): Normal wall motion.  No ischemia or infarction.  . OPEN REDUCTION INTERNAL FIXATION (ORIF) DISTAL RADIAL FRACTURE Left 01/13/2019   Procedure: OPEN REDUCTION INTERNAL FIXATION (ORIF) DISTAL  RADIAL FRACTURE - LEFT - SLEEP APNEA;  Surgeon: Hessie Knows, MD;  Location: [facility] ORS;  Service: Orthopedics;  Laterality: Left;  . ORIF ELBOW FRACTURE Right 01/01/2017   Procedure: OPEN REDUCTION INTERNAL FIXATION (ORIF) ELBOW/OLECRANON FRACTURE;  Surgeon: Hessie Knows, MD;  Location: [facility] ORS;  Service: Orthopedics;  Laterality: Right;  . ORIF WRIST FRACTURE Right 03/19/2015   Procedure: OPEN REDUCTION INTERNAL FIXATION (ORIF) WRIST FRACTURE;  Surgeon: Hessie Knows, MD;  Location: [facility] ORS;  Service: Orthopedics;  Laterality: Right;  . SHOULDER ARTHROSCOPY Right   . THORACIC AORTA - CAROTID ANGIOGRAM  October 2007   Dr. 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 TRANSTHORACIC ECHOCARDIOGRAM  February 2016   [facility]: Normal LV function. Dilated left atrium.  Domingo Dimes ECHOCARDIOGRAM  04/2019   [facility] April 20, 2019): EF 55-60 %.  Mild LVH.  Relatively normal valves.    There were no vitals filed for this visit.   Subjective Assessment - 10/16/20 0939    Subjective Patient had trigger finger surgery yesterday to  R hand. Aide says no restrictions besides not using hand too much or getting wet. Patient fatigued.    Pertinent History Patient is returning to PT for evaluation of balance. Patient has been seen by this therapist in the past. PMH AAA, arthritis, basal cell carcinoma of eyelid, benign neoplasm of ascending/descending colon, bilateral iliac artery stenosis, colon polyps, compression fx of 4th lumbar vertebra (s/p kyphoplasty T12, L3-5), COPD, CAD, dementia, falls,  GERD, hemorroids, HOH, HTN, Lewy Body Dementia, macular degeneration disease, osteoporosis, PAF, prostate cancer, prostate disorder, sciatic pain, sleep apnea, temporary cerebral vascular dysfunction. Comes with aide, having multiple near falls per day per aide, mainly to the sides.    Limitations Walking;Standing;House hold activities;Other (comment);Lifting;Sitting    How long can you sit comfortably? n/a back pain no longer limiting    How long can you stand comfortably? 10-12 with walker    How long can you walk comfortably? walks 15 minutes with a walker    Diagnostic tests MRI from [place] dated 03/28/2019 report was reviewed today. Acute compression fractures are noted at T12, L3, and L4. He has facet arthropathy at the lower lumbar levels. At L3-4 he has moderate central stenosis.    Patient Stated Goals walk without stumbling, be able to be steady enough to not have to have someone holding his gait belt.    Currently in Pain? Yes    Pain Score 2      Pain Location Hand    Pain Orientation Right    Pain Descriptors / Indicators Aching    Pain Type Acute pain;Surgical pain    Pain Onset Yesterday    Pain Frequency Constant                Treatment: Supine: LE rotation 30 seconds x 2 trials Hooklying BTB around knees clamshells 20x; 2 sets hooklying BTB around knees: marching 15x each LE;  2 sets   bridge 10x   4lb ankle weight: --heel slides 15x each LE, cues for sequencing -hip abduction 10x each LE opp LE in hooklying    Seated: 4lb ankle weight: -LAQ 15x each LE, cues for upright posture and sequencing  -march 15x each LE, cues for not leaning posteriorly to engage abdomen and to hit PT hand -heel toe raises 15x; very challenging   BTB adduction 15x each LE against PT resistance.   Seated LAQ with adduction soccer ball kicks 10x Seated rainbow ball adduction squeeze 15x 3 second holds  Sit to stand without UE support, frequent posterior LOB 10x  Seated soccer ball kicks for coordination, spatial awareness, and sequencing x 4 minutes, progressed to more challenging with kicking ball between target.   Patient's session is limited by his surgery yesterday requiring all interventions to be performed without RUE involvement. Patient agreeable to participate in all activities despite pain in hand. Familiar tasks are less challenging for patient due to cognition. Patient will continue to benefit from skilled physical therapy to improve generalized strength, ROM, and capacity for functional activity.                       PT Education - 10/16/20 0940    Education provided Yes    Education Details exercise technique, body mechanics,    Person(s) Educated Patient    Methods Explanation;Demonstration;Tactile cues;Verbal cues    Comprehension Verbalized understanding;Returned demonstration;Verbal cues required;Tactile cues required            PT Short Term Goals - 08/21/20 0959      PT SHORT TERM  GOAL #1   Title Patient will be independent in home exercise program to improve strength/mobility for better functional independence with ADLs.    Baseline 11/11: HEP given 12/21: HEP compliant    Time 4    Period Weeks    Status Achieved    Target Date 08/09/20             PT Long Term Goals - 10/04/20 1235      PT LONG TERM GOAL #1   Title Patient will increase FOTO score to equal to or greater than 51% to demonstrate statistically significant improvement in mobility and quality of life.    Baseline 11/11: 42% 12/21: 46.8% 2/3: 36%    Time 12    Period Weeks    Status Partially Met    Target Date 12/27/20      PT LONG TERM GOAL #2   Title Patient will demonstrate an improved Berg Balance Score of > 40/56 as to demonstrate improved balance with ADLs such as sitting/standing and transfer balance and reduced fall risk.    Baseline 11/11: 34/56 12/21: 37/56 2/3: 35/56    Time 12    Period Weeks    Status On-going    Target Date 12/27/20      PT LONG TERM GOAL #3   Title Patient (> 39 years old) will complete five times sit to stand test in < 15 seconds without UE support indicating an increased LE strength and improved balance    Baseline 11/11; 23.35 seconds with UE support 12/21: 27.85 with one near LOB 2/3: 12.6 seconds with heavy BUE support due to confusion    Time 12    Period Weeks    Status On-going    Target Date 12/27/20      PT LONG TERM GOAL #4   Title Patient will increase  10 meter walk test to >1.99m/s with LRAD  as to improve gait speed for better community ambulation and to reduce fall risk.    Baseline 11/11: 0.74 m/s without AD with one near LOB 12/21: 1.08 m/s    Time 12    Period Weeks    Status Achieved      PT LONG TERM GOAL #5   Title Patient will increase BLE gross strength to 4+/5 as to improve functional strength for independent gait, increased standing tolerance and increased ADL ability.    Baseline 11/11: see note 2/3: strength testing limited  due to patient confusion    Time 12    Period Weeks    Status Partially Met    Target Date 12/27/20      PT LONG TERM GOAL #6   Title Patient will increase six minute walk test distance to >1000 for progression to community ambulator and improve gait ability    Baseline 2/3: able to perform 4 minutes for 420 ft with rollator    Time 12    Period Weeks    Status New    Target Date 12/27/20                 Plan - 10/16/20 1036    Clinical Impression Statement Patient's session is limited by his surgery yesterday requiring all interventions to be performed without RUE involvement. Patient agreeable to participate in all activities despite pain in hand. Familiar tasks are less challenging for patient due to cognition. Patient will continue to benefit from skilled physical therapy to improve generalized strength, ROM, and capacity for functional activity.    Personal Factors and Comorbidities Age;Comorbidity 3+;Fitness;Past/Current Experience;Sex;Social Background;Time since onset of injury/illness/exacerbation;Transportation    Comorbidities AAA, arthritis, atherosclerotic heart disease, (s/p CABG), benign neoplasm of ascending/descending colon, COPD, CAD, dementia, dysrhythmia, frequent falls, GERD, hemorrhoid, HOH, HTN, PAF, prostate cancer.    Examination-Activity Limitations Bathing;Bend;Carry;Dressing;Continence;Stairs;Squat;Reach Overhead;Locomotion Level;Lift;Stand;Transfers;Toileting;Bed Mobility    Examination-Participation Restrictions Church;Cleaning;Community Activity;Driving;Interpersonal Relationship;Laundry;Volunteer;Shop;Personal Finances;Meal Prep;Yard Work    Product manager    Rehab Potential Fair    Clinical Impairments Affecting Rehab Potential (+) good support system, high prior level of function (-) currently undergoing radiation therapy for prostate cancer, memory deficits     PT Frequency 2x / week    PT Duration 12  weeks    PT Treatment/Interventions ADLs/Self Care Home Management;Aquatic Therapy;Cryotherapy;Moist Heat;Traction;Ultrasound;Therapeutic activities;Functional mobility training;Stair training;Gait training;DME Instruction;Therapeutic exercise;Balance training;Neuromuscular re-education;Patient/family education;Manual techniques;Passive range of motion;Energy conservation;Vestibular;Taping;Biofeedback;Electrical Stimulation;Iontophoresis 4mg /ml Dexamethasone;Dry needling;Visual/perceptual remediation/compensation;Cognitive remediation;Canalith Repostioning    PT Next Visit Plan balance, strength    PT Home Exercise Plan see above    Consulted and Agree with Plan of Care Patient;Family member/caregiver    Family Member Consulted aide           Patient will benefit from skilled therapeutic intervention in order to improve the following deficits and impairments:  Abnormal gait,Cardiopulmonary status limiting activity,Decreased activity tolerance,Decreased balance,Decreased knowledge of precautions,Decreased endurance,Decreased coordination,Decreased cognition,Decreased knowledge of use of DME,Decreased mobility,Difficulty walking,Decreased safety awareness,Decreased strength,Impaired flexibility,Impaired perceived functional ability,Impaired sensation,Postural dysfunction,Improper body mechanics,Pain,Impaired vision/preception,Decreased range of motion  Visit Diagnosis: Muscle weakness (generalized)  Unsteadiness on feet  Other abnormalities of gait and mobility     Problem List Patient Active Problem List   Diagnosis Date Noted  . AAA (abdominal aortic aneurysm) without rupture ([place]) 06/29/2020  . LBD (Lewy body dementia) ([place]) 05/14/2020  . Numbness and tingling in left hand 03/02/2020  . Hematuria   . Acute  kidney injury superimposed on CKD ([place])   . Sepsis due to gram-negative UTI ([place]) 12/26/2019  . Left nephrolithiasis 12/26/2019  . Hydronephrosis, left 12/26/2019  . Status post  abdominal aortic aneurysm (AAA) repair 04/15/2019  . Do not intubate, cardiopulmonary resuscitation (CPR)-only code status 10/08/2018  . Prostate cancer ([place]) 02/25/2018  . Prostatic cyst 01/15/2018  . Blood in stool   . Abnormal feces   . Stricture and stenosis of esophagus   . Mild cognitive impairment 08/18/2017  . Senile purpura ([place]) 04/29/2017  . Anemia 04/29/2017  . Frequent falls 04/29/2017  . Simple chronic bronchitis ([place]) 12/17/2016  . Benign localized hyperplasia of prostate with urinary obstruction 07/15/2016  . Elevated PSA 07/15/2016  . Incomplete emptying of bladder 07/15/2016  . Urge incontinence 07/15/2016  . Hypothyroidism 04/25/2016  . Macular degeneration 04/25/2016  . Erectile dysfunction due to arterial insufficiency   . Ischemic heart disease with chronotropic incompetence   . CN (constipation) 02/10/2015  . DD (diverticular disease) 02/10/2015  . Weakness 02/10/2015  . Lichen planus 28/31/5176  . Restless leg 02/10/2015  . 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 B12 deficiency 02/10/2015  . COPD (chronic obstructive pulmonary disease) ([place]) 11/14/2014  . Post Inflammatory Lung Changes 11/14/2014  . PAF (paroxysmal atrial fibrillation) (H[facility]) CHA2DS2-VASc = 4. AC = Eliquis   . Insomnia 12/09/2013  . OSA on CPAP   . Dyspnea on exertion - -essentially resolved with BB dose reduction & wgt loss 03/29/2013  . Overweight (BMI 25.0-29.9) -- Wgt back up 01/17/2013  . Hx of CABG   . Essential hypertension   . Hyperlipidemia with target LDL less than 70   . Aorto-iliac disease ([place])   . BCC (basal cell carcinoma), eyelid 01/03/2013  . Allergic rhinitis 08/17/2009  . Adaptation reaction 05/28/2009  . Acid reflux 05/28/2009  . Arthritis, degenerative 05/28/2009  . 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 Carotid artery occlusion without infarction 06/01/2006  . PAD (peripheral artery disease) - bilateral common iliac stents 12/15/2005  .  Atherosclerotic heart disease of artery bypass graft 09/01/1997   Janna Arch, PT, DPT   10/16/2020, 10:37 AM  [place] MAIN [facility] SERVICES [address] [place], [place], [address] Phone: [phone]   Fax:  [phone]  Name: BAYLEN BUCKNER MRN: [id number] Date of Birth: 1934/04/06

## 2020-10-17 ENCOUNTER — Ambulatory Visit: Payer: Medicare Other | Admitting: Family Medicine

## 2020-10-18 ENCOUNTER — Ambulatory Visit: Payer: Medicare Other

## 2020-10-18 ENCOUNTER — Telehealth: Payer: Self-pay

## 2020-10-18 NOTE — Telephone Encounter (Signed)
Copied from [place] [id number]. Topic: General - Other >> Oct 18, 2020  1:59 PM Alanda Slim E wrote: Reason for CRM: Marcene Brawn called to verify if wheelchair order wit mobility assessment fax was received / it was faxed on 2.11.22/ please advise

## 2020-10-19 NOTE — Telephone Encounter (Signed)
Have you seen this form?

## 2020-10-23 ENCOUNTER — Ambulatory Visit: Payer: Medicare Other

## 2020-10-23 ENCOUNTER — Telehealth: Payer: Self-pay

## 2020-10-23 ENCOUNTER — Other Ambulatory Visit: Payer: Self-pay

## 2020-10-23 DIAGNOSIS — M6281 Muscle weakness (generalized): Secondary | ICD-10-CM

## 2020-10-23 DIAGNOSIS — R2681 Unsteadiness on feet: Secondary | ICD-10-CM

## 2020-10-23 DIAGNOSIS — R2689 Other abnormalities of gait and mobility: Secondary | ICD-10-CM

## 2020-10-23 DIAGNOSIS — I10 Essential (primary) hypertension: Secondary | ICD-10-CM

## 2020-10-23 MED ORDER — METOPROLOL SUCCINATE ER 25 MG PO TB24: 25.0000 mg | ORAL_TABLET | Freq: Two times a day (BID) | ORAL | 5 refills | Status: DC

## 2020-10-23 NOTE — Telephone Encounter (Signed)
It's been completed and sent to medical records to fax.

## 2020-10-23 NOTE — Therapy (Signed)
[place] MAIN [facility] SERVICES [address] [place], [place], [address] Phone: [phone]   Fax:  [phone]  Physical Therapy Treatment  Patient Details  Name: [name] MRN: [id number] Date of Birth: 1933/11/29 Referring Provider (PT): Jennings Books K    Encounter Date: 10/23/2020   PT End of Session - 10/23/20 1247    Visit Number 24    Number of Visits 44    Date for PT Re-Evaluation 12/27/20    Authorization Type 4/10 PN 10/04/20    PT Start Time 0930    PT Stop Time 1014    PT Time Calculation (min) 44 min    Equipment Utilized During Treatment Gait belt    Activity Tolerance Patient tolerated treatment well    Behavior During Therapy [facility] for tasks assessed/performed           Past Medical History:  Diagnosis Date  . AAA (abdominal aortic aneurysm) ([facility]) 05/2019   [place] Rapid progression from June-Aug 2020 (4.6 - 5.4 cm): s/p EVAR (Dr. Lucky Cowboy [facility]): EVAR - 23 mm prox, 12 cm distal & x 18 cm length Gore Excluder Endoprosthesis Main body (via RFA distal to lowest Renal A); 59m x 14 cm L Contralateral Limb for L Iliac - extended with 8 mm x 6cm LifeStar stent (post-dilated with 7 mm DEB).  Additional R Iliac PTA not required.   . 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 ([place] 09/15/2017   09-15-2017 DIAGNOSIS:  A. COLON POLYP X 2, ASCENDING; COLD SNARE:  - TUBULAR ADENOMA.  - NEGATIVE FOR HIGH-GRADE DYSPLASIA AND MALIGNANCY.  - NO ADDITIONAL TISSUE IDENTIFIED; FECAL MATERIAL PRESENT.   B. COLON POLYP X 2, DESCENDING; COLD SNARE:  - ADENOCARCINOMA ARISING IN A DYSPLASTIC SESSILE SERRATED ADENOMA, WITH  LYMPHOVASCULAR INVASION, SEE COMMENT.  - TUBULAR ADENOMA, 2 FRAGMENTS, NEGATIVE FO  . Arthritis   . Basal cell carcinoma of eyelid 01/03/2013   2019 - R side of Nose  . Benign neoplasm of ascending colon   . Benign neoplasm of descending colon   . Bilateral iliac artery stenosis ([place] 2007   Bilateral Iliac A stenting; extension of EVAR  limbs into both Iliacs -- additional stent placed in L Iliac.  . Cancer ([place]    Skin cancer  . Colon polyps   . Complication of anesthesia    severe confusion agitation requiring hospital admission x 4 days  . Compression fracture of fourth lumbar vertebra ([place] 04/2019   - s/p Kyphoplasty; T12, L3-5 ([facility]  . COPD (chronic obstructive pulmonary disease) (HCC)    Centrilobular Emphysema  . Coronary artery disease involving native coronary artery without angina pectoris 1998   - s/p CABG, occluded SVG-D1; patent LIMA-LAD, SVG-OM, SVG- RCA  . Dementia ([place]    memory loss - started noting late 2019  . Falls frequently    broke foot  . Fracture 2021   left foot  . GERD (gastroesophageal reflux disease)   . Hemorrhoid 02/10/2015  . History of hiatal hernia   . History of kidney stones   . HOH (hard of hearing)   . Hypertension   . Lewy body dementia ([place] 04/04/2020  . Macular degeneration disease   . Osteoporosis   . PAF (paroxysmal atrial fibrillation) ([place]    on Eliquis for OAC - Rate Control   . Prostate cancer ([place]   . Prostate disorder   . Sciatic pain    Chronic  . Sleep apnea    cpap  .  Temporary cerebral vascular dysfunction 02/10/2015   Had negative work-up.  July, 2012.  No medication changes, had forgot them that day, got dehydrated.   . Urinary incontinence     Past Surgical History:  Procedure Laterality Date  . ABDOMINAL AORTIC ENDOVASCULAR STENT GRAFT Bilateral 05/18/2019   Procedure: ABDOMINAL AORTIC ENDOVASCULAR STENT GRAFT;  Surgeon: Algernon Huxley, MD;  Location: [facility] ORS;  Service: Vascular;  Laterality: Bilateral;  . ANKLE SURGERY Left    ORIF   . Arch aortogram and carotid aortogram  06/02/2006   Dr Oneida Alar did surgery  . BASAL CELL CARCINOMA EXCISION  04/2016   Dermatology  . CARDIAC CATHETERIZATION  01/30/1998    (Dr. Linard Millers): Native LAD & mRCA CTO. 90% D1. Mod Cx. SVG-D1 CTO. SVG-RCA & SVG-OM along with LIMA-LAD patent;  LV NORMAL Fxn  .  CAROTID ENDARTERECTOMY Left Oct. 29, 2007   Dr. Oneida Alar:  . CATARACT EXTRACTION W/PHACO Right 03/05/2017   Procedure: CATARACT EXTRACTION PHACO AND INTRAOCULAR LENS PLACEMENT (IOC);  Surgeon: Leandrew Koyanagi, MD;  Location: [facility] ORS;  Service: Ophthalmology;  Laterality: Right;  [place] 00:58.5 AP% 10.6 CDE 6.20 Fluid Pack lot # [id number]  . CATARACT EXTRACTION W/PHACO Left 04/15/2017   Procedure: CATARACT EXTRACTION PHACO AND INTRAOCULAR LENS PLACEMENT ([place])  Left;  Surgeon: Leandrew Koyanagi, MD;  Location: [place];  Service: Ophthalmology;  Laterality: Left;  . COLONOSCOPY WITH PROPOFOL N/A 09/15/2017   Procedure: COLONOSCOPY WITH PROPOFOL;  Surgeon: Lucilla Lame, MD;  Location: [facility] ENDOSCOPY;  Service: Endoscopy;  Laterality: N/A;  . COLONOSCOPY WITH PROPOFOL N/A 10/26/2018   Procedure: COLONOSCOPY WITH PROPOFOL;  Surgeon: Lucilla Lame, MD;  Location: [facility] ENDOSCOPY;  Service: Endoscopy;  Laterality: N/A;  . CORONARY ARTERY BYPASS GRAFT  1998   LIMA-LAD, SVG-OM, SVG-RPDA, SVG-DIAG  . CPET / MET  12/2012   Mild chronotropic incompetence - read 82% of predicted; also reduced effort; peak VO2 15.7 / 75% (did not reach Max effort) -- suggested ischemic response in last 1.5 minutes of exercise. Normal pulmonary function on PFTs but poor response to  . CYSTOSCOPY W/ RETROGRADES Left 01/13/2020   Procedure: CYSTOSCOPY WITH RETROGRADE PYELOGRAM;  Surgeon: Billey Co, MD;  Location: [facility] ORS;  Service: Urology;  Laterality: Left;  . CYSTOSCOPY WITH STENT PLACEMENT Left 12/26/2019   Procedure: CYSTOSCOPY WITH STENT PLACEMENT;  Surgeon: Billey Co, MD;  Location: [facility] ORS;  Service: Urology;  Laterality: Left;  . CYSTOSCOPY/URETEROSCOPY/HOLMIUM LASER/STENT PLACEMENT Left 01/13/2020   Procedure: CYSTOSCOPY/URETEROSCOPY/HOLMIUM LASER/STENT EXCHANGE;  Surgeon: Billey Co, MD;  Location: [facility] ORS;  Service: Urology;  Laterality: Left;  . ENDOVASCULAR REPAIR/STENT GRAFT N/A  05/18/2019   Procedure: ENDOVASCULAR REPAIR/STENT GRAFT;  Surgeon: Algernon Huxley, MD;  Location: [facility] INVASIVE CV LAB;; EVAR - 23 mm prox, 12 cm distal & x 18 cm length Gore Excluder Endoprosthesis Main body (via RFA distal to lowest Renal A); 52m x 14 cm L Contralateral Limb for L Iliac - extended with 8 mm x 6cm LifeStar stent; no additional R Iliac PTA needed.   . ESOPHAGOGASTRODUODENOSCOPY (EGD) WITH PROPOFOL N/A 09/15/2017   Procedure: ESOPHAGOGASTRODUODENOSCOPY (EGD) WITH PROPOFOL;  Surgeon: WLucilla Lame MD;  [facility];  Service: Endoscopy;  Laterality: N/A;  . FRACTURE SURGERY Right 03/19/2015   wrist  . FRACTURE SURGERY Left   . HIATAL HERNIA REPAIR    . ILIAC ARTERY STENT  12/15/2005   PTA and direct stenting rgt and lft common iliac arteries  . KYPHOPLASTY N/A 04/05/2019  Procedure: KYPHOPLASTY T12, L3, L4 L5;  Surgeon: Hessie Knows, MD;  Location: [facility] ORS;  Service: Orthopedics;  Laterality: N/A;  . NM GATED MYOVIEW ([place] HX)  05/'19; 8/'20   a) [facility]: Low Risk - no ischemia or infarct.  EF > 65%. no RWMA;; b) [facility]): Normal wall motion.  No ischemia or infarction.  . OPEN REDUCTION INTERNAL FIXATION (ORIF) DISTAL RADIAL FRACTURE Left 01/13/2019   Procedure: OPEN REDUCTION INTERNAL FIXATION (ORIF) DISTAL  RADIAL FRACTURE - LEFT - SLEEP APNEA;  Surgeon: Hessie Knows, MD;  Location: [facility] ORS;  Service: Orthopedics;  Laterality: Left;  . ORIF ELBOW FRACTURE Right 01/01/2017   Procedure: OPEN REDUCTION INTERNAL FIXATION (ORIF) ELBOW/OLECRANON FRACTURE;  Surgeon: Hessie Knows, MD;  Location: [facility] ORS;  Service: Orthopedics;  Laterality: Right;  . ORIF WRIST FRACTURE Right 03/19/2015   Procedure: OPEN REDUCTION INTERNAL FIXATION (ORIF) WRIST FRACTURE;  Surgeon: Hessie Knows, MD;  Location: [facility] ORS;  Service: Orthopedics;  Laterality: Right;  . SHOULDER ARTHROSCOPY Right   . THORACIC AORTA - CAROTID ANGIOGRAM  October 2007   Dr. 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 TRANSTHORACIC ECHOCARDIOGRAM  February 2016   [facility]: Normal LV function. Dilated left atrium.  Domingo Dimes ECHOCARDIOGRAM  04/2019   [facility] April 20, 2019): EF 55-60 %.  Mild LVH.  Relatively normal valves.    There were no vitals filed for this visit.   Subjective Assessment - 10/23/20 0940    Subjective Patient's aide reports no falls or LOB since last session. Reports he didn't sleep well last night.    Pertinent History Patient is returning to PT for evaluation of balance. Patient has been seen by this therapist in the past. PMH AAA, arthritis, basal cell carcinoma of eyelid, benign neoplasm of ascending/descending colon, bilateral iliac artery stenosis, colon polyps, compression fx of 4th lumbar vertebra (s/p kyphoplasty T12, L3-5), COPD, CAD, dementia, falls,  GERD, hemorroids, HOH, HTN, Lewy Body Dementia, macular degeneration disease, osteoporosis, PAF, prostate cancer, prostate disorder, sciatic pain, sleep apnea, temporary cerebral vascular dysfunction. Comes with aide, having multiple near falls per day per aide, mainly to the sides.    Limitations Walking;Standing;House hold activities;Other (comment);Lifting;Sitting    How long can you sit comfortably? n/a back pain no longer limiting    How long can you stand comfortably? 10-12 with walker    How long can you walk comfortably? walks 15 minutes with a walker    Diagnostic tests MRI from [facility] dated 03/28/2019 report was reviewed today. Acute compression fractures are noted at T12, L3, and L4. He has facet arthropathy at the lower lumbar levels. At L3-4 he has moderate central stenosis.    Patient Stated Goals walk without stumbling, be able to be steady enough to not have to have someone holding his gait belt.    Currently in Pain? No/denies    Pain Onset Yesterday                  Treatment: Supine: LE rotation 30 seconds x 2 trials Hooklying BTB around knees clamshells 20x; 2 sets hooklying BTB around knees: marching 15x each LE; 2 sets   bridge 10x    4lb ankle weight: --heel slides 15x each LE, cues for sequencing -hip abduction 10x each LE opp LE in hooklying    Seated: 4lb ankle weight: -LAQ 15x each LE, cues for upright posture and sequencing  -march  15x each LE, cues for not leaning posteriorly to engage abdomen and to hit PT hand -heel toe raises 15x; very challenging    BTB adduction 15x each LE against PT resistance.   Seated LAQ with adduction soccer ball kicks 10x Seated rainbow ball adduction squeeze 15x 3 second holds   Sit to stand without UE support, frequent posterior LOB 10x   Seated soccer ball kicks for coordination, spatial awareness, and sequencing x 4 minutes, progressed to more challenging with kicking ball between target.    Standing marches 10x each LE, frequent posterior LOB     Pt educated throughout session about proper posture and technique with exercises. Improved exercise technique, movement at target joints, use of target muscles after min to mod verbal, visual, tactile cues  Patient is cognitively impaired more than normal this session requiring increased cueing, verbal, visual, and demonstration. He is unable to perform stability interventions this session due to recent hand surgery. Patient will potentially be admitted to memory unit but will continue outpatient physical therapy per son.   Patient will continue to benefit from skilled physical therapy to improve generalized strength, ROM, and capacity for functional activity.                PT Education - 10/23/20 1247    Education provided Yes    Education Details exercise technique, body mechanics    Person(s) Educated Patient    Methods Explanation;Demonstration;Tactile cues;Verbal cues    Comprehension Verbalized understanding;Returned  demonstration;Verbal cues required;Tactile cues required            PT Short Term Goals - 08/21/20 0959      PT SHORT TERM GOAL #1   Title Patient will be independent in home exercise program to improve strength/mobility for better functional independence with ADLs.    Baseline 11/11: HEP given 12/21: HEP compliant    Time 4    Period Weeks    Status Achieved    Target Date 08/09/20             PT Long Term Goals - 10/04/20 1235      PT LONG TERM GOAL #1   Title Patient will increase FOTO score to equal to or greater than 51% to demonstrate statistically significant improvement in mobility and quality of life.    Baseline 11/11: 42% 12/21: 46.8% 2/3: 36%    Time 12    Period Weeks    Status Partially Met    Target Date 12/27/20      PT LONG TERM GOAL #2   Title Patient will demonstrate an improved Berg Balance Score of > 40/56 as to demonstrate improved balance with ADLs such as sitting/standing and transfer balance and reduced fall risk.    Baseline 11/11: 34/56 12/21: 37/56 2/3: 35/56    Time 12    Period Weeks    Status On-going    Target Date 12/27/20      PT LONG TERM GOAL #3   Title Patient (> 45 years old) will complete five times sit to stand test in < 15 seconds without UE support indicating an increased LE strength and improved balance    Baseline 11/11; 23.35 seconds with UE support 12/21: 27.85 with one near LOB 2/3: 12.6 seconds with heavy BUE support due to confusion    Time 12    Period Weeks    Status On-going    Target Date 12/27/20      PT LONG TERM GOAL #4   Title  Patient will increase 10 meter walk test to >1.48ms with LRAD  as to improve gait speed for better community ambulation and to reduce fall risk.    Baseline 11/11: 0.74 m/s without AD with one near LOB 12/21: 1.08 m/s    Time 12    Period Weeks    Status Achieved      PT LONG TERM GOAL #5   Title Patient will increase BLE gross strength to 4+/5 as to improve functional strength for  independent gait, increased standing tolerance and increased ADL ability.    Baseline 11/11: see note 2/3: strength testing limited due to patient confusion    Time 12    Period Weeks    Status Partially Met    Target Date 12/27/20      PT LONG TERM GOAL #6   Title Patient will increase six minute walk test distance to >1000 for progression to community ambulator and improve gait ability    Baseline 2/3: able to perform 4 minutes for 420 ft with rollator    Time 12    Period Weeks    Status New    Target Date 12/27/20                 Plan - 10/23/20 1248    Clinical Impression Statement Patient is cognitively impaired more than normal this session requiring increased cueing, verbal, visual, and demonstration. He is unable to perform stability interventions this session due to recent hand surgery. Patient will potentially be admitted to memory unit but will continue outpatient physical therapy per son.   Patient will continue to benefit from skilled physical therapy to improve generalized strength, ROM, and capacity for functional activity.    Personal Factors and Comorbidities Age;Comorbidity 3+;Fitness;Past/Current Experience;Sex;Social Background;Time since onset of injury/illness/exacerbation;Transportation    Comorbidities AAA, arthritis, atherosclerotic heart disease, (s/p CABG), benign neoplasm of ascending/descending colon, COPD, CAD, dementia, dysrhythmia, frequent falls, GERD, hemorrhoid, HOH, HTN, PAF, prostate cancer.    Examination-Activity Limitations Bathing;Bend;Carry;Dressing;Continence;Stairs;Squat;Reach Overhead;Locomotion Level;Lift;Stand;Transfers;Toileting;Bed Mobility    Examination-Participation Restrictions Church;Cleaning;Community Activity;Driving;Interpersonal Relationship;Laundry;Volunteer;Shop;Personal Finances;Meal Prep;Yard Work    SProduct manager   Rehab Potential Fair    Clinical Impairments Affecting  Rehab Potential (+) good support system, high prior level of function (-) currently undergoing radiation therapy for prostate cancer, memory deficits     PT Frequency 2x / week    PT Duration 12 weeks    PT Treatment/Interventions ADLs/Self Care Home Management;Aquatic Therapy;Cryotherapy;Moist Heat;Traction;Ultrasound;Therapeutic activities;Functional mobility training;Stair training;Gait training;DME Instruction;Therapeutic exercise;Balance training;Neuromuscular re-education;Patient/family education;Manual techniques;Passive range of motion;Energy conservation;Vestibular;Taping;Biofeedback;Electrical Stimulation;Iontophoresis 47mml Dexamethasone;Dry needling;Visual/perceptual remediation/compensation;Cognitive remediation;Canalith Repostioning    PT Next Visit Plan balance, strength    PT Home Exercise Plan see above    Consulted and Agree with Plan of Care Patient;Family member/caregiver    Family Member Consulted aide           Patient will benefit from skilled therapeutic intervention in order to improve the following deficits and impairments:  Abnormal gait,Cardiopulmonary status limiting activity,Decreased activity tolerance,Decreased balance,Decreased knowledge of precautions,Decreased endurance,Decreased coordination,Decreased cognition,Decreased knowledge of use of DME,Decreased mobility,Difficulty walking,Decreased safety awareness,Decreased strength,Impaired flexibility,Impaired perceived functional ability,Impaired sensation,Postural dysfunction,Improper body mechanics,Pain,Impaired vision/preception,Decreased range of motion  Visit Diagnosis: Muscle weakness (generalized)  Unsteadiness on feet  Other abnormalities of gait and mobility     Problem List Patient Active Problem List   Diagnosis Date Noted  . AAA (abdominal aortic aneurysm) without rupture (HCLe Center10/29/2021  . LBD (Lewy body dementia) (HCT[place]unak09/13/2021  . Numbness  and tingling in left hand 03/02/2020  .  Hematuria   . Acute kidney injury superimposed on CKD ([place])   . Sepsis due to gram-negative UTI ([place]) 12/26/2019  . Left nephrolithiasis 12/26/2019  . Hydronephrosis, left 12/26/2019  . Status post abdominal aortic aneurysm (AAA) repair 04/15/2019  . Do not intubate, cardiopulmonary resuscitation (CPR)-only code status 10/08/2018  . Prostate cancer ([place]) 02/25/2018  . Prostatic cyst 01/15/2018  . Blood in stool   . Abnormal feces   . Stricture and stenosis of esophagus   . Mild cognitive impairment 08/18/2017  . Senile purpura ([place]) 04/29/2017  . Anemia 04/29/2017  . Frequent falls 04/29/2017  . Simple chronic bronchitis ([place]) 12/17/2016  . Benign localized hyperplasia of prostate with urinary obstruction 07/15/2016  . Elevated PSA 07/15/2016  . Incomplete emptying of bladder 07/15/2016  . Urge incontinence 07/15/2016  . Hypothyroidism 04/25/2016  . Macular degeneration 04/25/2016  . Erectile dysfunction due to arterial insufficiency   . Ischemic heart disease with chronotropic incompetence   . CN (constipation) 02/10/2015  . DD (diverticular disease) 02/10/2015  . Weakness 02/10/2015  . Lichen planus 29/51/8841  . Restless leg 02/10/2015  . 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 B12 deficiency 02/10/2015  . COPD (chronic obstructive pulmonary disease) ([place]) 11/14/2014  . Post Inflammatory Lung Changes 11/14/2014  . PAF (paroxysmal atrial fibrillation) (HCC) CHA2DS2-VASc = 4. AC = Eliquis   . Insomnia 12/09/2013  . OSA on CPAP   . Dyspnea on exertion - -essentially resolved with BB dose reduction & wgt loss 03/29/2013  . Overweight (BMI 25.0-29.9) -- Wgt back up 01/17/2013  . Hx of CABG   . Essential hypertension   . Hyperlipidemia with target LDL less than 70   . Aorto-iliac disease ([place])   . BCC (basal cell carcinoma), eyelid 01/03/2013  . Allergic rhinitis 08/17/2009  . Adaptation reaction 05/28/2009  . Acid reflux 05/28/2009  . Arthritis, degenerative 05/28/2009   . 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 Carotid artery occlusion without infarction 06/01/2006  . PAD (peripheral artery disease) - bilateral common iliac stents 12/15/2005  . Atherosclerotic heart disease of artery bypass graft 09/01/1997   Janna Arch, PT, DPT   10/23/2020, 12:50 PM  [place] MAIN [facility] SERVICES [address] [place], [place], [address] Phone: [phone]   Fax:  [phone]  Name: AYODELE SANGALANG MRN: [id number] Date of Birth: [birth year]

## 2020-10-23 NOTE — Addendum Note (Signed)
Addended by: [name] on: 10/23/2020 02:17 PM   Modules accepted: Orders

## 2020-10-23 NOTE — Telephone Encounter (Signed)
Volunteer called patient on behalf of Palliative Care and did not get a answer from patient/family. ° °

## 2020-10-23 NOTE — Telephone Encounter (Signed)
Copied from [place] [phone]. Topic: General - Other >> Oct 23, 2020  1:20 PM Mcneil, Ja-Kwan wrote: Reason for CRM: Pt spouse [name] stated the Rx for metoprolol succinate (TOPROL-XL) 50 MG 24 hr tablet needs to be changed back to metoprolol succinate 25 MG tablet taken twice daily and sent to [place], [place] Phone: [phone]  Fax: [phone]

## 2020-10-23 NOTE — Telephone Encounter (Signed)
Please advise on medication instructions.

## 2020-10-25 ENCOUNTER — Ambulatory Visit: Payer: Medicare Other

## 2020-10-25 ENCOUNTER — Other Ambulatory Visit: Payer: Self-pay

## 2020-10-25 DIAGNOSIS — M545 Low back pain, unspecified: Secondary | ICD-10-CM

## 2020-10-25 DIAGNOSIS — R2681 Unsteadiness on feet: Secondary | ICD-10-CM | POA: Diagnosis not present

## 2020-10-25 DIAGNOSIS — M6281 Muscle weakness (generalized): Secondary | ICD-10-CM

## 2020-10-25 DIAGNOSIS — R2689 Other abnormalities of gait and mobility: Secondary | ICD-10-CM

## 2020-10-25 DIAGNOSIS — G8929 Other chronic pain: Secondary | ICD-10-CM

## 2020-10-25 NOTE — Therapy (Signed)
[place] MAIN [facility] SERVICES [address] [place], [place], [address] Phone: [phone]   Fax:  [phone]  Physical Therapy Treatment  Patient Details  Name: [name] MRN: [id number] Date of Birth: May 12, 1934 Referring Provider (PT): Jennings Books K    Encounter Date: 10/25/2020   PT End of Session - 10/25/20 0941    Visit Number 25    Number of Visits 44    Date for PT Re-Evaluation 12/27/20    Authorization Type 5/10 PN 10/04/20    PT Start Time 0933    PT Stop Time 1012    PT Time Calculation (min) 39 min    Equipment Utilized During Treatment Gait belt    Activity Tolerance Patient tolerated treatment well    Behavior During Therapy [facility] for tasks assessed/performed           Past Medical History:  Diagnosis Date  . AAA (abdominal aortic aneurysm) ([place]) 05/2019   [place] Rapid progression from June-Aug 2020 (4.6 - 5.4 cm): s/p EVAR (Dr. [facility]): EVAR - 23 mm prox, 12 cm distal & x 18 cm length Gore Excluder Endoprosthesis Main body (via RFA distal to lowest Renal A); 47mm x 14 cm L Contralateral Limb for L Iliac - extended with 8 mm x 6cm LifeStar stent (post-dilated with 7 mm DEB).  Additional R Iliac PTA not required.   . 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 ([place]) 09/15/2017   09-15-2017 DIAGNOSIS:  A. COLON POLYP X 2, ASCENDING; COLD SNARE:  - TUBULAR ADENOMA.  - NEGATIVE FOR HIGH-GRADE DYSPLASIA AND MALIGNANCY.  - NO ADDITIONAL TISSUE IDENTIFIED; FECAL MATERIAL PRESENT.   B. COLON POLYP X 2, DESCENDING; COLD SNARE:  - ADENOCARCINOMA ARISING IN A DYSPLASTIC SESSILE SERRATED ADENOMA, WITH  LYMPHOVASCULAR INVASION, SEE COMMENT.  - TUBULAR ADENOMA, 2 FRAGMENTS, NEGATIVE FO  . Arthritis   . Basal cell carcinoma of eyelid 01/03/2013   2019 - R side of Nose  . Benign neoplasm of ascending colon   . Benign neoplasm of descending colon   . Bilateral iliac artery stenosis ([place]) 2007   Bilateral Iliac A stenting; extension of EVAR  limbs into both Iliacs -- additional stent placed in L Iliac.  . Cancer ([place])    Skin cancer  . Colon polyps   . Complication of anesthesia    severe confusion agitation requiring hospital admission x 4 days  . Compression fracture of fourth lumbar vertebra ([place]) 04/2019   - s/p Kyphoplasty; T12, L3-5 [facility])  . COPD (chronic obstructive pulmonary disease) (HCC)    Centrilobular Emphysema  . Coronary artery disease involving native coronary artery without angina pectoris 1998   - s/p CABG, occluded SVG-D1; patent LIMA-LAD, SVG-OM, SVG- RCA  . Dementia ([place])    memory loss - started noting late 2019  . Falls frequently    broke foot  . Fracture 2021   left foot  . GERD (gastroesophageal reflux disease)   . Hemorrhoid 02/10/2015  . History of hiatal hernia   . History of kidney stones   . HOH (hard of hearing)   . Hypertension   . Lewy body dementia ([place]) 04/04/2020  . Macular degeneration disease   . Osteoporosis   . PAF (paroxysmal atrial fibrillation) ([place])    on Eliquis for OAC - Rate Control   . Prostate cancer ([place])   . Prostate disorder   . Sciatic pain    Chronic  . Sleep apnea    cpap  .  Temporary cerebral vascular dysfunction 02/10/2015   Had negative work-up.  July, 2012.  No medication changes, had forgot them that day, got dehydrated.   . Urinary incontinence     Past Surgical History:  Procedure Laterality Date  . ABDOMINAL AORTIC ENDOVASCULAR STENT GRAFT Bilateral 05/18/2019   Procedure: ABDOMINAL AORTIC ENDOVASCULAR STENT GRAFT;  Surgeon: Algernon Huxley, MD;  Location: [facility] ORS;  Service: Vascular;  Laterality: Bilateral;  . ANKLE SURGERY Left    ORIF   . Arch aortogram and carotid aortogram  06/02/2006   Dr Oneida Alar did surgery  . BASAL CELL CARCINOMA EXCISION  04/2016   Dermatology  . CARDIAC CATHETERIZATION  01/30/1998    (Dr. Linard Millers): Native LAD & mRCA CTO. 90% D1. Mod Cx. SVG-D1 CTO. SVG-RCA & SVG-OM along with LIMA-LAD patent;  LV NORMAL Fxn  .  CAROTID ENDARTERECTOMY Left Oct. 29, 2007   Dr. Oneida Alar:  . CATARACT EXTRACTION W/PHACO Right 03/05/2017   Procedure: CATARACT EXTRACTION PHACO AND INTRAOCULAR LENS PLACEMENT (IOC);  Surgeon: Leandrew Koyanagi, MD;  Location: [facility] ORS;  Service: Ophthalmology;  Laterality: Right;  [place] 00:58.5 AP% 10.6 CDE 6.20 Fluid Pack lot # [id number]  . CATARACT EXTRACTION W/PHACO Left 04/15/2017   Procedure: CATARACT EXTRACTION PHACO AND INTRAOCULAR LENS PLACEMENT ([place])  Left;  Surgeon: Leandrew Koyanagi, MD;  Location: [place];  Service: Ophthalmology;  Laterality: Left;  . COLONOSCOPY WITH PROPOFOL N/A 09/15/2017   Procedure: COLONOSCOPY WITH PROPOFOL;  Surgeon: Lucilla Lame, MD;  Location: [facility] ENDOSCOPY;  Service: Endoscopy;  Laterality: N/A;  . COLONOSCOPY WITH PROPOFOL N/A 10/26/2018   Procedure: COLONOSCOPY WITH PROPOFOL;  Surgeon: Lucilla Lame, MD;  Location: [facility] ENDOSCOPY;  Service: Endoscopy;  Laterality: N/A;  . CORONARY ARTERY BYPASS GRAFT  1998   LIMA-LAD, SVG-OM, SVG-RPDA, SVG-DIAG  . CPET / MET  12/2012   Mild chronotropic incompetence - read 82% of predicted; also reduced effort; peak VO2 15.7 / 75% (did not reach Max effort) -- suggested ischemic response in last 1.5 minutes of exercise. Normal pulmonary function on PFTs but poor response to  . CYSTOSCOPY W/ RETROGRADES Left 01/13/2020   Procedure: CYSTOSCOPY WITH RETROGRADE PYELOGRAM;  Surgeon: Billey Co, MD;  Location: [facility] ORS;  Service: Urology;  Laterality: Left;  . CYSTOSCOPY WITH STENT PLACEMENT Left 12/26/2019   Procedure: CYSTOSCOPY WITH STENT PLACEMENT;  Surgeon: Billey Co, MD;  Location: [facility] ORS;  Service: Urology;  Laterality: Left;  . CYSTOSCOPY/URETEROSCOPY/HOLMIUM LASER/STENT PLACEMENT Left 01/13/2020   Procedure: CYSTOSCOPY/URETEROSCOPY/HOLMIUM LASER/STENT EXCHANGE;  Surgeon: Billey Co, MD;  Location: [facility] ORS;  Service: Urology;  Laterality: Left;  . ENDOVASCULAR REPAIR/STENT GRAFT N/A  05/18/2019   Procedure: ENDOVASCULAR REPAIR/STENT GRAFT;  Surgeon: Algernon Huxley, MD;  Location: [facility] INVASIVE CV LAB;; EVAR - 23 mm prox, 12 cm distal & x 18 cm length Gore Excluder Endoprosthesis Main body (via RFA distal to lowest Renal A); 6m x 14 cm L Contralateral Limb for L Iliac - extended with 8 mm x 6cm LifeStar stent; no additional R Iliac PTA needed.   . ESOPHAGOGASTRODUODENOSCOPY (EGD) WITH PROPOFOL N/A 09/15/2017   Procedure: ESOPHAGOGASTRODUODENOSCOPY (EGD) WITH PROPOFOL;  Surgeon: WLucilla Lame MD;  [facility];  Service: Endoscopy;  Laterality: N/A;  . FRACTURE SURGERY Right 03/19/2015   wrist  . FRACTURE SURGERY Left   . HIATAL HERNIA REPAIR    . ILIAC ARTERY STENT  12/15/2005   PTA and direct stenting rgt and lft common iliac arteries  . KYPHOPLASTY N/A 04/05/2019  Procedure: KYPHOPLASTY T12, L3, L4 L5;  Surgeon: Kennedy Bucker, MD;  Location: [facility] ORS;  Service: Orthopedics;  Laterality: N/A;  . NM GATED MYOVIEW ([facility] HX)  05/'19; 8/'20   a) [facility]: Low Risk - no ischemia or infarct.  EF > 65%. no RWMA;; b) [facility]): Normal wall motion.  No ischemia or infarction.  . OPEN REDUCTION INTERNAL FIXATION (ORIF) DISTAL RADIAL FRACTURE Left 01/13/2019   Procedure: OPEN REDUCTION INTERNAL FIXATION (ORIF) DISTAL  RADIAL FRACTURE - LEFT - SLEEP APNEA;  Surgeon: Kennedy Bucker, MD;  Location: [facility] ORS;  Service: Orthopedics;  Laterality: Left;  . ORIF ELBOW FRACTURE Right 01/01/2017   Procedure: OPEN REDUCTION INTERNAL FIXATION (ORIF) ELBOW/OLECRANON FRACTURE;  Surgeon: Kennedy Bucker, MD;  Location: [facility] ORS;  Service: Orthopedics;  Laterality: Right;  . ORIF WRIST FRACTURE Right 03/19/2015   Procedure: OPEN REDUCTION INTERNAL FIXATION (ORIF) WRIST FRACTURE;  Surgeon: Kennedy Bucker, MD;  Location: [facility] ORS;  Service: Orthopedics;  Laterality: Right;  . SHOULDER ARTHROSCOPY Right   . THORACIC AORTA - CAROTID ANGIOGRAM  October 2007   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 TRANSTHORACIC ECHOCARDIOGRAM  February 2016   [facility]: Normal LV function. Dilated left atrium.  Krystal Clark ECHOCARDIOGRAM  04/2019   [facility] April 20, 2019): EF 55-60 %.  Mild LVH.  Relatively normal valves.    There were no vitals filed for this visit.   Subjective Assessment - 10/25/20 0938    Subjective Patient denies any pain today. Aide reports pt fell to the floor sitting on EOB this morning. No injury or concerns from fall this morning.    Pertinent History Patient is returning to PT for evaluation of balance. Patient has been seen by this therapist in the past. PMH AAA, arthritis, basal cell carcinoma of eyelid, benign neoplasm of ascending/descending colon, bilateral iliac artery stenosis, colon polyps, compression fx of 4th lumbar vertebra (s/p kyphoplasty T12, L3-5), COPD, CAD, dementia, falls,  GERD, hemorroids, HOH, HTN, Lewy Body Dementia, macular degeneration disease, osteoporosis, PAF, prostate cancer, prostate disorder, sciatic pain, sleep apnea, temporary cerebral vascular dysfunction. Comes with aide, having multiple near falls per day per aide, mainly to the sides.    Limitations Walking;Standing;House hold activities;Other (comment);Lifting;Sitting    How long can you sit comfortably? n/a back pain no longer limiting    How long can you stand comfortably? 10-12 with walker    How long can you walk comfortably? walks 15 minutes with a walker    Diagnostic tests MRI from [facility] imaging Center dated 03/28/2019 report was reviewed today. Acute compression fractures are noted at T12, L3, and L4. He has facet arthropathy at the lower lumbar levels. At L3-4 he has moderate central stenosis.    Patient Stated Goals walk without stumbling, be able to be steady enough to not have to have someone holding his gait belt.    Currently in Pain? No/denies    Pain Score 0-No pain     Pain Onset Yesterday            Therex in supine   Lumbar trunk rotations: x5/direction  Bridges: x10  Marching with 4 lbs ankle weights: x15/LE  Heel Slides with 4 lbs ankle weights: x10/LE  Seated  Marches with 4 lbs AW's:  x15/LE  LAQ's with 4 lbs AW's: x15/LE  LE hip add with blue TB: x10/LE  Mod verbal, visual, and tactile cuing for appropriate  muscle recruitment for targeted muscle groups with fair carryover after cuing. Easily fatigued with resistance exercise.    Neuro Re-Ed:   Alternating seated soccer ball kicks to improve coordination and spatial awareness. 4 lbs AW's for strength. 3 min.   Sit to stand dart balls for dynamic balance and strength. Difficulty with upright posture with fair carryover after verbal cuing. CGA throughout. Tactile cuing on gait belt to initiate forward trunk lean with sit to stand to reduce post lean in standing.  x15 balls  Standing LE marches with RW progressed to static standing with no UE support on RW for 20 sec. CGA  Gait training with RW in gym 72'. Mod verbal cuing for keeping RW closer to BOS to reduce risk of falls. Mod UE support with hands required with gait with RW. Discontinued due to R hand surgical repair. CGA  Seated   Alternating foot taps on 4" step for coordination. Difficulty coordinating LE's. x30 sec. x30 sec with increased speed. Fair difficulty coordinating with increased speed.   LE side steps over blue TB on floor for visual cue to improve coordination: x10/LE. X10/LE for increased speed. Good carryover with single LE coordination with increased speed.    Patient requiring mod-max verbal and tactile cuing for exercise for form/technique and use of targeted muscles. Therapist demonstration for visual cuing to improve patient carryover with neuro re-ed exercises. Increased difficulty with coordination tasks with increasing speed.      PT Education - 10/25/20 0940    Education provided Yes    Education Details  exercise technique, body mechanics.    Person(s) Educated Patient    Methods Explanation;Demonstration;Tactile cues;Verbal cues    Comprehension Verbalized understanding;Returned demonstration;Verbal cues required;Tactile cues required            PT Short Term Goals - 08/21/20 0959      PT SHORT TERM GOAL #1   Title Patient will be independent in home exercise program to improve strength/mobility for better functional independence with ADLs.    Baseline 11/11: HEP given 12/21: HEP compliant    Time 4    Period Weeks    Status Achieved    Target Date 08/09/20             PT Long Term Goals - 10/04/20 1235      PT LONG TERM GOAL #1   Title Patient will increase FOTO score to equal to or greater than 51% to demonstrate statistically significant improvement in mobility and quality of life.    Baseline 11/11: 42% 12/21: 46.8% 2/3: 36%    Time 12    Period Weeks    Status Partially Met    Target Date 12/27/20      PT LONG TERM GOAL #2   Title Patient will demonstrate an improved Berg Balance Score of > 40/56 as to demonstrate improved balance with ADLs such as sitting/standing and transfer balance and reduced fall risk.    Baseline 11/11: 34/56 12/21: 37/56 2/3: 35/56    Time 12    Period Weeks    Status On-going    Target Date 12/27/20      PT LONG TERM GOAL #3   Title Patient (> 29 years old) will complete five times sit to stand test in < 15 seconds without UE support indicating an increased LE strength and improved balance    Baseline 11/11; 23.35 seconds with UE support 12/21: 27.85 with one near LOB 2/3: 12.6 seconds with heavy BUE support due to  confusion    Time 12    Period Weeks    Status On-going    Target Date 12/27/20      PT LONG TERM GOAL #4   Title Patient will increase 10 meter walk test to >1.4m/s with LRAD  as to improve gait speed for better community ambulation and to reduce fall risk.    Baseline 11/11: 0.74 m/s without AD with one near LOB 12/21:  1.08 m/s    Time 12    Period Weeks    Status Achieved      PT LONG TERM GOAL #5   Title Patient will increase BLE gross strength to 4+/5 as to improve functional strength for independent gait, increased standing tolerance and increased ADL ability.    Baseline 11/11: see note 2/3: strength testing limited due to patient confusion    Time 12    Period Weeks    Status Partially Met    Target Date 12/27/20      PT LONG TERM GOAL #6   Title Patient will increase six minute walk test distance to >1000 for progression to community ambulator and improve gait ability    Baseline 2/3: able to perform 4 minutes for 420 ft with rollator    Time 12    Period Weeks    Status New    Target Date 12/27/20                 Plan - 10/25/20 1014    Clinical Impression Statement Patient reqired mod to max verbal, tactile, and visual cues to improve carryover with resistance and balance exercises. Consistent posterior lean with STS at standing portion with poor caryover after cues requiring manual therapist assist for upright posture. With walking tasks with RW pt utilizes significant BUE support. Discontinued due to recent R hand operation. Pt can continue to benefit from further skilled PT treatment to improve LE strength, balance, and capacity for functional activity.    Personal Factors and Comorbidities Age;Comorbidity 3+;Fitness;Past/Current Experience;Sex;Social Background;Time since onset of injury/illness/exacerbation;Transportation    Comorbidities AAA, arthritis, atherosclerotic heart disease, (s/p CABG), benign neoplasm of ascending/descending colon, COPD, CAD, dementia, dysrhythmia, frequent falls, GERD, hemorrhoid, HOH, HTN, PAF, prostate cancer.    Examination-Activity Limitations Bathing;Bend;Carry;Dressing;Continence;Stairs;Squat;Reach Overhead;Locomotion Level;Lift;Stand;Transfers;Toileting;Bed Mobility    Examination-Participation Restrictions Church;Cleaning;Community  Activity;Driving;Interpersonal Relationship;Laundry;Volunteer;Shop;Personal Finances;Meal Prep;Yard Work    [employer] Evolving/Moderate complexity    Clinical Decision Making Moderate    Rehab Potential Fair    Clinical Impairments Affecting Rehab Potential (+) good support system, high prior level of function (-) currently undergoing radiation therapy for prostate cancer, memory deficits     PT Frequency 2x / week    PT Duration 12 weeks    PT Treatment/Interventions ADLs/Self Care Home Management;Aquatic Therapy;Cryotherapy;Moist Heat;Traction;Ultrasound;Therapeutic activities;Functional mobility training;Stair training;Gait training;DME Instruction;Therapeutic exercise;Balance training;Neuromuscular re-education;Patient/family education;Manual techniques;Passive range of motion;Energy conservation;Vestibular;Taping;Biofeedback;Electrical Stimulation;Iontophoresis 4mg /ml Dexamethasone;Dry needling;Visual/perceptual remediation/compensation;Cognitive remediation;Canalith Repostioning    PT Next Visit Plan balance, strength    PT Home Exercise Plan see above    Consulted and Agree with Plan of Care Patient;Family member/caregiver    Family Member Consulted aide           Patient will benefit from skilled therapeutic intervention in order to improve the following deficits and impairments:  Abnormal gait,Cardiopulmonary status limiting activity,Decreased activity tolerance,Decreased balance,Decreased knowledge of precautions,Decreased endurance,Decreased coordination,Decreased cognition,Decreased knowledge of use of DME,Decreased mobility,Difficulty walking,Decreased safety awareness,Decreased strength,Impaired flexibility,Impaired perceived functional ability,Impaired sensation,Postural dysfunction,Improper body mechanics,Pain,Impaired vision/preception,Decreased range of motion  Visit Diagnosis:  Muscle weakness (generalized)  Unsteadiness on feet  Other abnormalities  of gait and mobility  Chronic low back pain, unspecified back pain laterality, unspecified whether sciatica present     Problem List Patient Active Problem List   Diagnosis Date Noted  . AAA (abdominal aortic aneurysm) without rupture ([place]) 06/29/2020  . LBD (Lewy body dementia) ([place]) 05/14/2020  . Numbness and tingling in left hand 03/02/2020  . Hematuria   . Acute kidney injury superimposed on CKD ([place])   . Sepsis due to gram-negative UTI ([place]) 12/26/2019  . Left nephrolithiasis 12/26/2019  . Hydronephrosis, left 12/26/2019  . Status post abdominal aortic aneurysm (AAA) repair 04/15/2019  . Do not intubate, cardiopulmonary resuscitation (CPR)-only code status 10/08/2018  . Prostate cancer ([place]) 02/25/2018  . Prostatic cyst 01/15/2018  . Blood in stool   . Abnormal feces   . Stricture and stenosis of esophagus   . Mild cognitive impairment 08/18/2017  . Senile purpura ([place]) 04/29/2017  . Anemia 04/29/2017  . Frequent falls 04/29/2017  . Simple chronic bronchitis ([place]) 12/17/2016  . Benign localized hyperplasia of prostate with urinary obstruction 07/15/2016  . Elevated PSA 07/15/2016  . Incomplete emptying of bladder 07/15/2016  . Urge incontinence 07/15/2016  . Hypothyroidism 04/25/2016  . Macular degeneration 04/25/2016  . Erectile dysfunction due to arterial insufficiency   . Ischemic heart disease with chronotropic incompetence   . CN (constipation) 02/10/2015  . DD (diverticular disease) 02/10/2015  . Weakness 02/10/2015  . Lichen planus 08/65/7846  . Restless leg 02/10/2015  . 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 B12 deficiency 02/10/2015  . COPD (chronic obstructive pulmonary disease) ([place]) 11/14/2014  . Post Inflammatory Lung Changes 11/14/2014  . PAF (paroxysmal atrial fibrillation) (HCC) CHA2DS2-VASc = 4. AC = Eliquis   . Insomnia 12/09/2013  . OSA on CPAP   . Dyspnea on exertion - -essentially resolved with BB dose reduction & wgt loss 03/29/2013  .  Overweight (BMI 25.0-29.9) -- Wgt back up 01/17/2013  . Hx of CABG   . Essential hypertension   . Hyperlipidemia with target LDL less than 70   . Aorto-iliac disease ([place])   . BCC (basal cell carcinoma), eyelid 01/03/2013  . Allergic rhinitis 08/17/2009  . Adaptation reaction 05/28/2009  . Acid reflux 05/28/2009  . Arthritis, degenerative 05/28/2009  . 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 Carotid artery occlusion without infarction 06/01/2006  . PAD (peripheral artery disease) - bilateral common iliac stents 12/15/2005  . Atherosclerotic heart disease of artery bypass graft 09/01/1997   Janna Arch, PT, DPT   10/25/2020, 11:27 AM  [place] MAIN [facility] SERVICES [address] [place], [place], [address] Phone: [phone]   Fax:  [phone]  Name: DEVAUGHN SAVANT MRN: [id number] Date of Birth: October 31, 1933

## 2020-10-30 ENCOUNTER — Ambulatory Visit: Payer: Medicare Other | Attending: Nurse Practitioner

## 2020-10-30 ENCOUNTER — Other Ambulatory Visit: Payer: Self-pay

## 2020-10-30 DIAGNOSIS — M6281 Muscle weakness (generalized): Secondary | ICD-10-CM

## 2020-10-30 DIAGNOSIS — M545 Low back pain, unspecified: Secondary | ICD-10-CM | POA: Insufficient documentation

## 2020-10-30 DIAGNOSIS — R262 Difficulty in walking, not elsewhere classified: Secondary | ICD-10-CM | POA: Insufficient documentation

## 2020-10-30 DIAGNOSIS — R2689 Other abnormalities of gait and mobility: Secondary | ICD-10-CM | POA: Diagnosis present

## 2020-10-30 DIAGNOSIS — G8929 Other chronic pain: Secondary | ICD-10-CM | POA: Insufficient documentation

## 2020-10-30 DIAGNOSIS — R2681 Unsteadiness on feet: Secondary | ICD-10-CM | POA: Diagnosis present

## 2020-10-30 NOTE — Therapy (Signed)
[place] MAIN [facility] SERVICES [address] [place], [place], [address] Phone: [phone]   Fax:  [phone]  Physical Therapy Treatment  Patient Details  Name: [name] MRN: [id number] Date of Birth: [birth year] Referring Provider (PT): Jennings Books K    Encounter Date: 10/30/2020   PT End of Session - 10/30/20 1338    Visit Number 26    Number of Visits 44    Date for PT Re-Evaluation 12/27/20    Authorization Type 6/10 PN 10/04/20    PT Start Time 0930    PT Stop Time 1014    PT Time Calculation (min) 44 min    Equipment Utilized During Treatment Gait belt    Activity Tolerance Patient tolerated treatment well    Behavior During Therapy WFL for tasks assessed/performed           Past Medical History:  Diagnosis Date  . AAA (abdominal aortic aneurysm) ([place]) 05/2019   Relativley Rapid progression from June-Aug 2020 (4.6 - 5.4 cm): s/p EVAR (Dr. [facility]): EVAR - 23 mm prox, 12 cm distal & x 18 cm length Gore Excluder Endoprosthesis Main body (via RFA distal to lowest Renal A); 72m x 14 cm L Contralateral Limb for L Iliac - extended with 8 mm x 6cm LifeStar stent (post-dilated with 7 mm DEB).  Additional R Iliac PTA not required.   . 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 ([place] 09/15/2017   09-15-2017 DIAGNOSIS:  A. COLON POLYP X 2, ASCENDING; COLD SNARE:  - TUBULAR ADENOMA.  - NEGATIVE FOR HIGH-GRADE DYSPLASIA AND MALIGNANCY.  - NO ADDITIONAL TISSUE IDENTIFIED; FECAL MATERIAL PRESENT.   B. COLON POLYP X 2, DESCENDING; COLD SNARE:  - ADENOCARCINOMA ARISING IN A DYSPLASTIC SESSILE SERRATED ADENOMA, WITH  LYMPHOVASCULAR INVASION, SEE COMMENT.  - TUBULAR ADENOMA, 2 FRAGMENTS, NEGATIVE FO  . Arthritis   . Basal cell carcinoma of eyelid 01/03/2013   2019 - R side of Nose  . Benign neoplasm of ascending colon   . Benign neoplasm of descending colon   . Bilateral [place]ac artery stenosis ([place]   Bilateral Iliac A stenting; extension of EVAR  limbs into both Iliacs -- additional stent placed in L Iliac.  . Cancer ([place]    Skin cancer  . Colon polyps   . Complication of anesthesia    severe confusion agitation requiring hospital admission x 4 days  . Compression fracture of fourth lumbar vertebra ([place] 04/2019   - s/p Kyphoplasty; T12, L3-5 ([facility]  . COPD (chronic obstructive pulmonary disease) (HCC)    Centrilobular Emphysema  . Coronary artery disease involving native coronary artery without angina pectoris 1998   - s/p CABG, occluded SVG-D1; patent LIMA-LAD, SVG-OM, SVG- RCA  . Dementia ([place]    memory loss - started noting late 2019  . Falls frequently    broke foot  . Fracture 2021   left foot  . GERD (gastroesophageal reflux disease)   . Hemorrhoid 02/10/2015  . History of hiatal hernia   . History of kidney stones   . HOH (hard of hearing)   . Hypertension   . Lewy body dementia ([place] 04/04/2020  . Macular degeneration disease   . Osteoporosis   . PAF (paroxysmal atrial fibrillation) ([place]    on Eliquis for OAC - Rate Control   . Prostate cancer ([place]   . Prostate disorder   . Sciatic pain    Chronic  . Sleep apnea    cpap  .  Temporary cerebral vascular dysfunction 02/10/2015   Had negative work-up.  July, 2012.  No medication changes, had forgot them that day, got dehydrated.   . Urinary incontinence     Past Surgical History:  Procedure Laterality Date  . ABDOMINAL AORTIC ENDOVASCULAR STENT GRAFT Bilateral 05/18/2019   Procedure: ABDOMINAL AORTIC ENDOVASCULAR STENT GRAFT;  Surgeon: Algernon Huxley, MD;  Location: [facility] ORS;  Service: Vascular;  Laterality: Bilateral;  . ANKLE SURGERY Left    ORIF   . Arch aortogram and carotid aortogram  06/02/2006   Dr Oneida Alar did surgery  . BASAL CELL CARCINOMA EXCISION  04/2016   Dermatology  . CARDIAC CATHETERIZATION  01/30/1998    (Dr. Linard Millers): Native LAD & mRCA CTO. 90% D1. Mod Cx. SVG-D1 CTO. SVG-RCA & SVG-OM along with LIMA-LAD patent;  LV NORMAL Fxn  .  CAROTID ENDARTERECTOMY Left Oct. 29, 2007   Dr. Oneida Alar:  . CATARACT EXTRACTION W/PHACO Right 03/05/2017   Procedure: CATARACT EXTRACTION PHACO AND INTRAOCULAR LENS PLACEMENT (IOC);  Surgeon: Leandrew Koyanagi, MD;  Location: [facility] ORS;  Service: Ophthalmology;  Laterality: Right;  [place] 00:58.5 AP% 10.6 CDE 6.20 Fluid Pack lot # [id number]  . CATARACT EXTRACTION W/PHACO Left 04/15/2017   Procedure: CATARACT EXTRACTION PHACO AND INTRAOCULAR LENS PLACEMENT ([place])  Left;  Surgeon: Leandrew Koyanagi, MD;  Location: [place];  Service: Ophthalmology;  Laterality: Left;  . COLONOSCOPY WITH PROPOFOL N/A 09/15/2017   Procedure: COLONOSCOPY WITH PROPOFOL;  Surgeon: Lucilla Lame, MD;  Location: [facility] ENDOSCOPY;  Service: Endoscopy;  Laterality: N/A;  . COLONOSCOPY WITH PROPOFOL N/A 10/26/2018   Procedure: COLONOSCOPY WITH PROPOFOL;  Surgeon: Lucilla Lame, MD;  Location: [facility] ENDOSCOPY;  Service: Endoscopy;  Laterality: N/A;  . CORONARY ARTERY BYPASS GRAFT  1998   LIMA-LAD, SVG-OM, SVG-RPDA, SVG-DIAG  . CPET / MET  12/2012   Mild chronotropic incompetence - read 82% of predicted; also reduced effort; peak VO2 15.7 / 75% (did not reach Max effort) -- suggested ischemic response in last 1.5 minutes of exercise. Normal pulmonary function on PFTs but poor response to  . CYSTOSCOPY W/ RETROGRADES Left 01/13/2020   Procedure: CYSTOSCOPY WITH RETROGRADE PYELOGRAM;  Surgeon: Billey Co, MD;  Location: [facility] ORS;  Service: Urology;  Laterality: Left;  . CYSTOSCOPY WITH STENT PLACEMENT Left 12/26/2019   Procedure: CYSTOSCOPY WITH STENT PLACEMENT;  Surgeon: Billey Co, MD;  Location: [facility] ORS;  Service: Urology;  Laterality: Left;  . CYSTOSCOPY/URETEROSCOPY/HOLMIUM LASER/STENT PLACEMENT Left 01/13/2020   Procedure: CYSTOSCOPY/URETEROSCOPY/HOLMIUM LASER/STENT EXCHANGE;  Surgeon: Billey Co, MD;  Location: [facility] ORS;  Service: Urology;  Laterality: Left;  . ENDOVASCULAR REPAIR/STENT GRAFT N/A  05/18/2019   Procedure: ENDOVASCULAR REPAIR/STENT GRAFT;  Surgeon: Algernon Huxley, MD;  Location: [facility] INVASIVE CV LAB;; EVAR - 23 mm prox, 12 cm distal & x 18 cm length Gore Excluder Endoprosthesis Main body (via RFA distal to lowest Renal A); 6m x 14 cm L Contralateral Limb for L Iliac - extended with 8 mm x 6cm LifeStar stent; no additional R Iliac PTA needed.   . ESOPHAGOGASTRODUODENOSCOPY (EGD) WITH PROPOFOL N/A 09/15/2017   Procedure: ESOPHAGOGASTRODUODENOSCOPY (EGD) WITH PROPOFOL;  Surgeon: WLucilla Lame MD;  [facility];  Service: Endoscopy;  Laterality: N/A;  . FRACTURE SURGERY Right 03/19/2015   wrist  . FRACTURE SURGERY Left   . HIATAL HERNIA REPAIR    . ILIAC ARTERY STENT  12/15/2005   PTA and direct stenting rgt and lft common iliac arteries  . KYPHOPLASTY N/A 04/05/2019  Procedure: KYPHOPLASTY T12, L3, L4 L5;  Surgeon: Hessie Knows, MD;  Location: [facility] ORS;  Service: Orthopedics;  Laterality: N/A;  . NM GATED MYOVIEW ([place] HX)  05/'19; 8/'20   a) [facility]: Low Risk - no ischemia or infarct.  EF > 65%. no RWMA;; b) [facility]): Normal wall motion.  No ischemia or infarction.  . OPEN REDUCTION INTERNAL FIXATION (ORIF) DISTAL RADIAL FRACTURE Left 01/13/2019   Procedure: OPEN REDUCTION INTERNAL FIXATION (ORIF) DISTAL  RADIAL FRACTURE - LEFT - SLEEP APNEA;  Surgeon: Hessie Knows, MD;  Location: [facility] ORS;  Service: Orthopedics;  Laterality: Left;  . ORIF ELBOW FRACTURE Right 01/01/2017   Procedure: OPEN REDUCTION INTERNAL FIXATION (ORIF) ELBOW/OLECRANON FRACTURE;  Surgeon: Hessie Knows, MD;  Location: [facility] ORS;  Service: Orthopedics;  Laterality: Right;  . ORIF WRIST FRACTURE Right 03/19/2015   Procedure: OPEN REDUCTION INTERNAL FIXATION (ORIF) WRIST FRACTURE;  Surgeon: Hessie Knows, MD;  Location: [facility] ORS;  Service: Orthopedics;  Laterality: Right;  . SHOULDER ARTHROSCOPY Right   . THORACIC AORTA - CAROTID ANGIOGRAM  October 2007   Dr. 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 TRANSTHORACIC ECHOCARDIOGRAM  February 2016   [facility]: Normal LV function. Dilated left atrium.  Domingo Dimes ECHOCARDIOGRAM  04/2019   [facility] April 20, 2019): EF 55-60 %.  Mild LVH.  Relatively normal valves.    There were no vitals filed for this visit.   Subjective Assessment - 10/30/20 1003    Subjective Patient fell on significant other on Friday, breaking her wrist. He is ok but is worried about his significant other.    Pertinent History Patient is returning to PT for evaluation of balance. Patient has been seen by this therapist in the past. PMH AAA, arthritis, basal cell carcinoma of eyelid, benign neoplasm of ascending/descending colon, bilateral iliac artery stenosis, colon polyps, compression fx of 4th lumbar vertebra (s/p kyphoplasty T12, L3-5), COPD, CAD, dementia, falls,  GERD, hemorroids, HOH, HTN, Lewy Body Dementia, macular degeneration disease, osteoporosis, PAF, prostate cancer, prostate disorder, sciatic pain, sleep apnea, temporary cerebral vascular dysfunction. Comes with aide, having multiple near falls per day per aide, mainly to the sides.    Limitations Walking;Standing;House hold activities;Other (comment);Lifting;Sitting    How long can you sit comfortably? n/a back pain no longer limiting    How long can you stand comfortably? 10-12 with walker    How long can you walk comfortably? walks 15 minutes with a walker    Diagnostic tests MRI from [place] dated 03/28/2019 report was reviewed today. Acute compression fractures are noted at T12, L3, and L4. He has facet arthropathy at the lower lumbar levels. At L3-4 he has moderate central stenosis.    Patient Stated Goals walk without stumbling, be able to be steady enough to not have to have someone holding his gait belt.    Currently in Pain? No/denies                     vitals at  start of session 117/63 pulse 57         In // bars:  4lb ankle weight: -high knee march 4x length of // bars; visual cueing required -side stepping 4x length of // bars, cues for neutral hip alignment for optimal lateral muscle recruitment.  -hamstring curls 10x each LE, BUE support  6" step toe taps 10x each LE, BUE support  Stand and  reach for ball, throw into hoop x 15 balls Standing balloon taps reaching inside/outside BOS x 4 minutes   Orange hurdle; step over and back 10x, BUE support, challenging with spatial awareness of RLE   Diona Foley toss with patient and aide ; one near LOB, multiple challenges with spatial awareness to catch ball x 3 minutes   Step over yellow line and clap 10x each LE, very challenging for patient cognition.     hedgehog squish/stomp 10x each LE, BUE support   Patient will be moving to memory care unit tomorrow, education on [employer] for transportation provided. Patient is confused intermittently throughout session but eager to participate in all interventions. He particularly enjoyed task driven interventions such as throwing at a target. Patient will continue to benefit from skilled physical therapy to improve generalized strength, ROM, and capacity for functional activity.                  PT Education - 10/30/20 1008    Education provided Yes    Education Details exercise technique, body mechanics    Person(s) Educated Patient    Methods Explanation;Demonstration;Tactile cues;Verbal cues    Comprehension Verbalized understanding;Verbal cues required;Returned demonstration;Tactile cues required            PT Short Term Goals - 08/21/20 0959      PT SHORT TERM GOAL #1   Title Patient will be independent in home exercise program to improve strength/mobility for better functional independence with ADLs.    Baseline 11/11: HEP given 12/21: HEP compliant    Time 4    Period Weeks    Status Achieved    Target Date 08/09/20              PT Long Term Goals - 10/04/20 1235      PT LONG TERM GOAL #1   Title Patient will increase FOTO score to equal to or greater than 51% to demonstrate statistically significant improvement in mobility and quality of life.    Baseline 11/11: 42% 12/21: 46.8% 2/3: 36%    Time 12    Period Weeks    Status Partially Met    Target Date 12/27/20      PT LONG TERM GOAL #2   Title Patient will demonstrate an improved Berg Balance Score of > 40/56 as to demonstrate improved balance with ADLs such as sitting/standing and transfer balance and reduced fall risk.    Baseline 11/11: 34/56 12/21: 37/56 2/3: 35/56    Time 12    Period Weeks    Status On-going    Target Date 12/27/20      PT LONG TERM GOAL #3   Title Patient (> 78 years old) will complete five times sit to stand test in < 15 seconds without UE support indicating an increased LE strength and improved balance    Baseline 11/11; 23.35 seconds with UE support 12/21: 27.85 with one near LOB 2/3: 12.6 seconds with heavy BUE support due to confusion    Time 12    Period Weeks    Status On-going    Target Date 12/27/20      PT LONG TERM GOAL #4   Title Patient will increase 10 meter walk test to >1.39ms with LRAD  as to improve gait speed for better community ambulation and to reduce fall risk.    Baseline 11/11: 0.74 m/s without AD with one near LOB 12/21: 1.08 m/s    Time 12    Period Weeks  Status Achieved      PT LONG TERM GOAL #5   Title Patient will increase BLE gross strength to 4+/5 as to improve functional strength for independent gait, increased standing tolerance and increased ADL ability.    Baseline 11/11: see note 2/3: strength testing limited due to patient confusion    Time 12    Period Weeks    Status Partially Met    Target Date 12/27/20      PT LONG TERM GOAL #6   Title Patient will increase six minute walk test distance to >1000 for progression to community ambulator and improve gait ability    Baseline  2/3: able to perform 4 minutes for 420 ft with rollator    Time 12    Period Weeks    Status New    Target Date 12/27/20                 Plan - 10/30/20 1341    Clinical Impression Statement Patient will be moving to memory care unit tomorrow, education on [employer] for transportation provided. Patient is confused intermittently throughout session but eager to participate in all interventions. He particularly enjoyed task driven interventions such as throwing at a target. Patient will continue to benefit from skilled physical therapy to improve generalized strength, ROM, and capacity for functional activity.    Personal Factors and Comorbidities Age;Comorbidity 3+;Fitness;Past/Current Experience;Sex;Social Background;Time since onset of injury/illness/exacerbation;Transportation    Comorbidities AAA, arthritis, atherosclerotic heart disease, (s/p CABG), benign neoplasm of ascending/descending colon, COPD, CAD, dementia, dysrhythmia, frequent falls, GERD, hemorrhoid, HOH, HTN, PAF, prostate cancer.    Examination-Activity Limitations Bathing;Bend;Carry;Dressing;Continence;Stairs;Squat;Reach Overhead;Locomotion Level;Lift;Stand;Transfers;Toileting;Bed Mobility    Examination-Participation Restrictions Church;Cleaning;Community Activity;Driving;Interpersonal Relationship;Laundry;Volunteer;Shop;Personal Finances;Meal Prep;Yard Work    Product manager    Rehab Potential Fair    Clinical Impairments Affecting Rehab Potential (+) good support system, high prior level of function (-) currently undergoing radiation therapy for prostate cancer, memory deficits     PT Frequency 2x / week    PT Duration 12 weeks    PT Treatment/Interventions ADLs/Self Care Home Management;Aquatic Therapy;Cryotherapy;Moist Heat;Traction;Ultrasound;Therapeutic activities;Functional mobility training;Stair training;Gait training;DME Instruction;Therapeutic exercise;Balance  training;Neuromuscular re-education;Patient/family education;Manual techniques;Passive range of motion;Energy conservation;Vestibular;Taping;Biofeedback;Electrical Stimulation;Iontophoresis 59m/ml Dexamethasone;Dry needling;Visual/perceptual remediation/compensation;Cognitive remediation;Canalith Repostioning    PT Next Visit Plan balance, strength    PT Home Exercise Plan see above    Consulted and Agree with Plan of Care Patient;Family member/caregiver    Family Member Consulted aide           Patient will benefit from skilled therapeutic intervention in order to improve the following deficits and impairments:  Abnormal gait,Cardiopulmonary status limiting activity,Decreased activity tolerance,Decreased balance,Decreased knowledge of precautions,Decreased endurance,Decreased coordination,Decreased cognition,Decreased knowledge of use of DME,Decreased mobility,Difficulty walking,Decreased safety awareness,Decreased strength,Impaired flexibility,Impaired perceived functional ability,Impaired sensation,Postural dysfunction,Improper body mechanics,Pain,Impaired vision/preception,Decreased range of motion  Visit Diagnosis: Muscle weakness (generalized)  Unsteadiness on feet  Other abnormalities of gait and mobility     Problem List Patient Active Problem List   Diagnosis Date Noted  . AAA (abdominal aortic aneurysm) without rupture ([place] 06/29/2020  . LBD (Lewy body dementia) ([place] 05/14/2020  . Numbness and tingling in left hand 03/02/2020  . Hematuria   . Acute kidney injury superimposed on CKD ([place]   . Sepsis due to gram-negative UTI ([place] 12/26/2019  . Left nephrolithiasis 12/26/2019  . Hydronephrosis, left 12/26/2019  . Status post abdominal aortic aneurysm (AAA) repair 04/15/2019  . Do not intubate, cardiopulmonary resuscitation (CPR)-only code status 10/08/2018  . Prostate  cancer ([place]) 02/25/2018  . Prostatic cyst 01/15/2018  . Blood in stool   . Abnormal feces   .  Stricture and stenosis of esophagus   . Mild cognitive impairment 08/18/2017  . Senile purpura ([place]) 04/29/2017  . Anemia 04/29/2017  . Frequent falls 04/29/2017  . Simple chronic bronchitis ([place]) 12/17/2016  . Benign localized hyperplasia of prostate with urinary obstruction 07/15/2016  . Elevated PSA 07/15/2016  . Incomplete emptying of bladder 07/15/2016  . Urge incontinence 07/15/2016  . Hypothyroidism 04/25/2016  . Macular degeneration 04/25/2016  . Erectile dysfunction due to arterial insufficiency   . Ischemic heart disease with chronotropic incompetence   . CN (constipation) 02/10/2015  . DD (diverticular disease) 02/10/2015  . Weakness 02/10/2015  . Lichen planus 85/10/7739  . Restless leg 02/10/2015  . 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 B12 deficiency 02/10/2015  . COPD (chronic obstructive pulmonary disease) ([place]) 11/14/2014  . Post Inflammatory Lung Changes 11/14/2014  . PAF (paroxysmal atrial fibrillation) (HCC) CHA2DS2-VASc = 4. AC = Eliquis   . Insomnia 12/09/2013  . OSA on CPAP   . Dyspnea on exertion - -essentially resolved with BB dose reduction & wgt loss 03/29/2013  . Overweight (BMI 25.0-29.9) -- Wgt back up 01/17/2013  . Hx of CABG   . Essential hypertension   . Hyperlipidemia with target LDL less than 70   . Aorto-iliac disease ([place])   . BCC (basal cell carcinoma), eyelid 01/03/2013  . Allergic rhinitis 08/17/2009  . Adaptation reaction 05/28/2009  . Acid reflux 05/28/2009  . Arthritis, degenerative 05/28/2009  . 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 Carotid artery occlusion without infarction 06/01/2006  . PAD (peripheral artery disease) - bilateral common iliac stents 12/15/2005  . Atherosclerotic heart disease of artery bypass graft 09/01/1997   Janna Arch, PT, DPT   10/30/2020, 1:42 PM  [place] MAIN [facility] SERVICES [address] [place], [place], [address] Phone: [phone]   Fax:   [phone]  Name: [name] MRN: [id number] Date of Birth: 1934-07-12

## 2020-10-31 ENCOUNTER — Encounter: Payer: Self-pay | Admitting: Family Medicine

## 2020-11-01 ENCOUNTER — Ambulatory Visit: Payer: Medicare Other

## 2020-11-01 ENCOUNTER — Other Ambulatory Visit: Payer: Self-pay

## 2020-11-01 VITALS — HR 65

## 2020-11-01 DIAGNOSIS — M545 Low back pain, unspecified: Secondary | ICD-10-CM

## 2020-11-01 DIAGNOSIS — R2681 Unsteadiness on feet: Secondary | ICD-10-CM

## 2020-11-01 DIAGNOSIS — M6281 Muscle weakness (generalized): Secondary | ICD-10-CM | POA: Diagnosis not present

## 2020-11-01 DIAGNOSIS — R2689 Other abnormalities of gait and mobility: Secondary | ICD-10-CM

## 2020-11-01 DIAGNOSIS — R262 Difficulty in walking, not elsewhere classified: Secondary | ICD-10-CM

## 2020-11-01 DIAGNOSIS — G8929 Other chronic pain: Secondary | ICD-10-CM

## 2020-11-01 NOTE — Therapy (Signed)
[place] MAIN [facility] SERVICES [address] [place], [place], [address] Phone: [phone]   Fax:  [phone]  Physical Therapy Treatment  Patient Details  Name: [name] MRN: [id number] Date of Birth: [birth year] Referring Provider (PT): Jennings Books K    Encounter Date: 11/01/2020   PT End of Session - 11/01/20 0949    Visit Number 27    Number of Visits 44    Date for PT Re-Evaluation 12/27/20    Authorization Type 6/10 PN 10/04/20    PT Start Time 0935    PT Stop Time 1015    PT Time Calculation (min) 40 min    Equipment Utilized During Treatment Gait belt    Activity Tolerance Patient tolerated treatment well    Behavior During Therapy [facility] for tasks assessed/performed           Past Medical History:  Diagnosis Date  . AAA (abdominal aortic aneurysm) ([place]) 05/2019   [place] Rapid progression from June-Aug 2020 (4.6 - 5.4 cm): s/p EVAR (Dr. Lucky Cowboy [facility]): EVAR - 23 mm prox, 12 cm distal & x 18 cm length Gore Excluder Endoprosthesis Main body (via RFA distal to lowest Renal A); 39mm x 14 cm L Contralateral Limb for L Iliac - extended with 8 mm x 6cm LifeStar stent (post-dilated with 7 mm DEB).  Additional R Iliac PTA not required.   . 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 ([place]) 09/15/2017   09-15-2017 DIAGNOSIS:  A. COLON POLYP X 2, ASCENDING; COLD SNARE:  - TUBULAR ADENOMA.  - NEGATIVE FOR HIGH-GRADE DYSPLASIA AND MALIGNANCY.  - NO ADDITIONAL TISSUE IDENTIFIED; FECAL MATERIAL PRESENT.   B. COLON POLYP X 2, DESCENDING; COLD SNARE:  - ADENOCARCINOMA ARISING IN A DYSPLASTIC SESSILE SERRATED ADENOMA, WITH  LYMPHOVASCULAR INVASION, SEE COMMENT.  - TUBULAR ADENOMA, 2 FRAGMENTS, NEGATIVE FO  . Arthritis   . Basal cell carcinoma of eyelid 01/03/2013   2019 - R side of Nose  . Benign neoplasm of ascending colon   . Benign neoplasm of descending colon   . Bilateral iliac artery stenosis ([place]) 2007   Bilateral Iliac A stenting; extension of EVAR  limbs into both Iliacs -- additional stent placed in L Iliac.  . Cancer ([place])    Skin cancer  . Colon polyps   . Complication of anesthesia    severe confusion agitation requiring hospital admission x 4 days  . Compression fracture of fourth lumbar vertebra ([place]) 04/2019   - s/p Kyphoplasty; T12, L3-5 [facility])  . COPD (chronic obstructive pulmonary disease) (HCC)    Centrilobular Emphysema  . Coronary artery disease involving native coronary artery without angina pectoris 1998   - s/p CABG, occluded SVG-D1; patent LIMA-LAD, SVG-OM, SVG- RCA  . Dementia ([place])    memory loss - started noting late 2019  . Falls frequently    broke foot  . Fracture 2021   left foot  . GERD (gastroesophageal reflux disease)   . Hemorrhoid 02/10/2015  . History of hiatal hernia   . History of kidney stones   . HOH (hard of hearing)   . Hypertension   . Lewy body dementia ([place]) 04/04/2020  . Macular degeneration disease   . Osteoporosis   . PAF (paroxysmal atrial fibrillation) ([place])    on Eliquis for OAC - Rate Control   . Prostate cancer ([place])   . Prostate disorder   . Sciatic pain    Chronic  . Sleep apnea    cpap  .  Temporary cerebral vascular dysfunction 02/10/2015   Had negative work-up.  July, 2012.  No medication changes, had forgot them that day, got dehydrated.   . Urinary incontinence     Past Surgical History:  Procedure Laterality Date  . ABDOMINAL AORTIC ENDOVASCULAR STENT GRAFT Bilateral 05/18/2019   Procedure: ABDOMINAL AORTIC ENDOVASCULAR STENT GRAFT;  Surgeon: Algernon Huxley, MD;  Location: [facility] ORS;  Service: Vascular;  Laterality: Bilateral;  . ANKLE SURGERY Left    ORIF   . Arch aortogram and carotid aortogram  06/02/2006   Dr Oneida Alar did surgery  . BASAL CELL CARCINOMA EXCISION  04/2016   Dermatology  . CARDIAC CATHETERIZATION  01/30/1998    (Dr. Linard Millers): Native LAD & mRCA CTO. 90% D1. Mod Cx. SVG-D1 CTO. SVG-RCA & SVG-OM along with LIMA-LAD patent;  LV NORMAL Fxn  .  CAROTID ENDARTERECTOMY Left Oct. 29, 2007   Dr. Oneida Alar:  . CATARACT EXTRACTION W/PHACO Right 03/05/2017   Procedure: CATARACT EXTRACTION PHACO AND INTRAOCULAR LENS PLACEMENT (IOC);  Surgeon: Leandrew Koyanagi, MD;  Location: [facility] ORS;  Service: Ophthalmology;  Laterality: Right;  [place] 00:58.5 AP% 10.6 CDE 6.20 Fluid Pack lot # [id number]  . CATARACT EXTRACTION W/PHACO Left 04/15/2017   Procedure: CATARACT EXTRACTION PHACO AND INTRAOCULAR LENS PLACEMENT ([place])  Left;  Surgeon: Leandrew Koyanagi, MD;  Location: [place];  Service: Ophthalmology;  Laterality: Left;  . COLONOSCOPY WITH PROPOFOL N/A 09/15/2017   Procedure: COLONOSCOPY WITH PROPOFOL;  Surgeon: Lucilla Lame, MD;  Location: [facility] ENDOSCOPY;  Service: Endoscopy;  Laterality: N/A;  . COLONOSCOPY WITH PROPOFOL N/A 10/26/2018   Procedure: COLONOSCOPY WITH PROPOFOL;  Surgeon: Lucilla Lame, MD;  Location: [facility] ENDOSCOPY;  Service: Endoscopy;  Laterality: N/A;  . CORONARY ARTERY BYPASS GRAFT  1998   LIMA-LAD, SVG-OM, SVG-RPDA, SVG-DIAG  . CPET / MET  12/2012   Mild chronotropic incompetence - read 82% of predicted; also reduced effort; peak VO2 15.7 / 75% (did not reach Max effort) -- suggested ischemic response in last 1.5 minutes of exercise. Normal pulmonary function on PFTs but poor response to  . CYSTOSCOPY W/ RETROGRADES Left 01/13/2020   Procedure: CYSTOSCOPY WITH RETROGRADE PYELOGRAM;  Surgeon: Billey Co, MD;  Location: [facility] ORS;  Service: Urology;  Laterality: Left;  . CYSTOSCOPY WITH STENT PLACEMENT Left 12/26/2019   Procedure: CYSTOSCOPY WITH STENT PLACEMENT;  Surgeon: Billey Co, MD;  Location: [facility] ORS;  Service: Urology;  Laterality: Left;  . CYSTOSCOPY/URETEROSCOPY/HOLMIUM LASER/STENT PLACEMENT Left 01/13/2020   Procedure: CYSTOSCOPY/URETEROSCOPY/HOLMIUM LASER/STENT EXCHANGE;  Surgeon: Billey Co, MD;  Location: [facility] ORS;  Service: Urology;  Laterality: Left;  . ENDOVASCULAR REPAIR/STENT GRAFT N/A  05/18/2019   Procedure: ENDOVASCULAR REPAIR/STENT GRAFT;  Surgeon: Algernon Huxley, MD;  Location: [facility] INVASIVE CV LAB;; EVAR - 23 mm prox, 12 cm distal & x 18 cm length Gore Excluder Endoprosthesis Main body (via RFA distal to lowest Renal A); 6m x 14 cm L Contralateral Limb for L Iliac - extended with 8 mm x 6cm LifeStar stent; no additional R Iliac PTA needed.   . ESOPHAGOGASTRODUODENOSCOPY (EGD) WITH PROPOFOL N/A 09/15/2017   Procedure: ESOPHAGOGASTRODUODENOSCOPY (EGD) WITH PROPOFOL;  Surgeon: WLucilla Lame MD;  [facility];  Service: Endoscopy;  Laterality: N/A;  . FRACTURE SURGERY Right 03/19/2015   wrist  . FRACTURE SURGERY Left   . HIATAL HERNIA REPAIR    . ILIAC ARTERY STENT  12/15/2005   PTA and direct stenting rgt and lft common iliac arteries  . KYPHOPLASTY N/A 04/05/2019  Procedure: KYPHOPLASTY T12, L3, L4 L5;  Surgeon: Hessie Knows, MD;  Location: [facility] ORS;  Service: Orthopedics;  Laterality: N/A;  . NM GATED MYOVIEW ([place] [facility])  05/'19; 8/'20   a) [facility]: Low Risk - no ischemia or infarct.  EF > 65%. no RWMA;; b) [facility]): Normal wall motion.  No ischemia or infarction.  . OPEN REDUCTION INTERNAL FIXATION (ORIF) DISTAL RADIAL FRACTURE Left 01/13/2019   Procedure: OPEN REDUCTION INTERNAL FIXATION (ORIF) DISTAL  RADIAL FRACTURE - LEFT - SLEEP APNEA;  Surgeon: Hessie Knows, MD;  Location: [facility] ORS;  Service: Orthopedics;  Laterality: Left;  . ORIF ELBOW FRACTURE Right 01/01/2017   Procedure: OPEN REDUCTION INTERNAL FIXATION (ORIF) ELBOW/OLECRANON FRACTURE;  Surgeon: Hessie Knows, MD;  Location: [facility] ORS;  Service: Orthopedics;  Laterality: Right;  . ORIF WRIST FRACTURE Right 03/19/2015   Procedure: OPEN REDUCTION INTERNAL FIXATION (ORIF) WRIST FRACTURE;  Surgeon: Hessie Knows, MD;  Location: [facility] ORS;  Service: Orthopedics;  Laterality: Right;  . SHOULDER ARTHROSCOPY Right   . THORACIC AORTA - CAROTID ANGIOGRAM  October 2007   Dr. 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 TRANSTHORACIC ECHOCARDIOGRAM  February 2016   [facility]: Normal LV function. Dilated left atrium.  Domingo Dimes ECHOCARDIOGRAM  04/2019   [facility] April 20, 2019): EF 55-60 %.  Mild LVH.  Relatively normal valves.    Vitals:   11/01/20 0947  Pulse: 65     Subjective Assessment - 11/01/20 0944    Subjective Pt with no significant changes since last session. Aide denies recent falls.    Patient is accompained by: --   Arsenio Katz   Pertinent History Patient is returning to PT for evaluation of balance. Patient has been seen by this therapist in the past. PMH AAA, arthritis, basal cell carcinoma of eyelid, benign neoplasm of ascending/descending colon, bilateral iliac artery stenosis, colon polyps, compression fx of 4th lumbar vertebra (s/p kyphoplasty T12, L3-5), COPD, CAD, dementia, falls,  GERD, hemorroids, HOH, HTN, Lewy Body Dementia, macular degeneration disease, osteoporosis, PAF, prostate cancer, prostate disorder, sciatic pain, sleep apnea, temporary cerebral vascular dysfunction. Comes with aide, having multiple near falls per day per aide, mainly to the sides.    Currently in Pain? No/denies           Interventions: - Amb 135ft x3 with rollator and min guard, x2 seated rest breaks - Standing marching with BUE support, 4#, BLE x10ea - Standing HS curls with BUE support, 4#, BLE x10ea - Toe tap at treadmill with BUE support, 4#, BLE x10ea - Cone taps x55ft - Lateral stepping 2x45ft - Retro amb x74ft, no righting reaction strategies noted; required verbal/tactile cues for forward gait to correct balance - Standing ball toss (underhand and floor pass), 110min - Standing ball toss at 45deg angle bil (underhand and floor pass), 19min - Standing ball toss to self, 18min - pt able to pick ball up off floor demonstrating wide BOS and appropriate anterior weight shift with min guard    Entire session performed in target of motor control training, spatial awareness. Pt purposefully given interventions to create frequent LOB to allow for high volume practice of righting strategies with physical assistance provided only as needed. Entire session performed with gait belt donned and minGuard assist of patient.   PT Education - 11/01/20 0946    Education provided Yes    Education Details exercise technique, body mechanics, safety with mobility  Person(s) Educated Patient;Caregiver(s)    Methods Explanation;Demonstration;Tactile cues;Verbal cues    Comprehension Need further instruction            PT Short Term Goals - 08/21/20 0959      PT SHORT TERM GOAL #1   Title Patient will be independent in home exercise program to improve strength/mobility for better functional independence with ADLs.    Baseline 11/11: HEP given 12/21: HEP compliant    Time 4    Period Weeks    Status Achieved    Target Date 08/09/20             PT Long Term Goals - 10/04/20 1235      PT LONG TERM GOAL #1   Title Patient will increase FOTO score to equal to or greater than 51% to demonstrate statistically significant improvement in mobility and quality of life.    Baseline 11/11: 42% 12/21: 46.8% 2/3: 36%    Time 12    Period Weeks    Status Partially Met    Target Date 12/27/20      PT LONG TERM GOAL #2   Title Patient will demonstrate an improved Berg Balance Score of > 40/56 as to demonstrate improved balance with ADLs such as sitting/standing and transfer balance and reduced fall risk.    Baseline 11/11: 34/56 12/21: 37/56 2/3: 35/56    Time 12    Period Weeks    Status On-going    Target Date 12/27/20      PT LONG TERM GOAL #3   Title Patient (> 16 years old) will complete five times sit to stand test in < 15 seconds without UE support indicating an increased LE strength and improved balance    Baseline 11/11; 23.35 seconds with UE support 12/21: 27.85 with one near LOB  2/3: 12.6 seconds with heavy BUE support due to confusion    Time 12    Period Weeks    Status On-going    Target Date 12/27/20      PT LONG TERM GOAL #4   Title Patient will increase 10 meter walk test to >1.59m/s with LRAD  as to improve gait speed for better community ambulation and to reduce fall risk.    Baseline 11/11: 0.74 m/s without AD with one near LOB 12/21: 1.08 m/s    Time 12    Period Weeks    Status Achieved      PT LONG TERM GOAL #5   Title Patient will increase BLE gross strength to 4+/5 as to improve functional strength for independent gait, increased standing tolerance and increased ADL ability.    Baseline 11/11: see note 2/3: strength testing limited due to patient confusion    Time 12    Period Weeks    Status Partially Met    Target Date 12/27/20      PT LONG TERM GOAL #6   Title Patient will increase six minute walk test distance to >1000 for progression to community ambulator and improve gait ability    Baseline 2/3: able to perform 4 minutes for 420 ft with rollator    Time 12    Period Weeks    Status New    Target Date 12/27/20                 Plan - 11/01/20 0950    Clinical Impression Statement Pt tolerated treatment well today. Decreased R foot clearance during swing noted with fatigue during ambulation. Due to poor  balance, pt required increased assist for standing exercise. Multiple LOB noted with retro amb without apparent righting reaction strategies, requiring increased assist and multimodal cues for safety. Gait deviations and poor balance increase the pt's risk of falls. Pt will continue to benefit from skilled physical therapy services to address deficits and improve overall safety with functional mobility. Will continue per POC.   Personal Factors and Comorbidities Age;Comorbidity 3+;Fitness;Past/Current Experience;Sex;Social Background;Time since onset of injury/illness/exacerbation;Transportation    Comorbidities AAA, arthritis,  atherosclerotic heart disease, (s/p CABG), benign neoplasm of ascending/descending colon, COPD, CAD, dementia, dysrhythmia, frequent falls, GERD, hemorrhoid, HOH, HTN, PAF, prostate cancer.    Examination-Activity Limitations Bathing;Bend;Carry;Dressing;Continence;Stairs;Squat;Reach Overhead;Locomotion Level;Lift;Stand;Transfers;Toileting;Bed Mobility    Examination-Participation Restrictions Church;Cleaning;Community Activity;Driving;Interpersonal Relationship;Laundry;Volunteer;Shop;Personal Finances;Meal Prep;Yard Work    [employer] Evolving/Moderate complexity    Clinical Decision Making Moderate    Rehab Potential Fair    Clinical Impairments Affecting Rehab Potential (+) good support system, high prior level of function (-) currently undergoing radiation therapy for prostate cancer, memory deficits     PT Frequency 2x / week    PT Duration 12 weeks    PT Treatment/Interventions ADLs/Self Care Home Management;Aquatic Therapy;Cryotherapy;Moist Heat;Traction;Ultrasound;Therapeutic activities;Functional mobility training;Stair training;Gait training;DME Instruction;Therapeutic exercise;Balance training;Neuromuscular re-education;Patient/family education;Manual techniques;Passive range of motion;Energy conservation;Vestibular;Taping;Biofeedback;Electrical Stimulation;Iontophoresis 4mg /ml Dexamethasone;Dry needling;Visual/perceptual remediation/compensation;Cognitive remediation;Canalith Repostioning    PT Next Visit Plan balance, strength    PT Home Exercise Plan see above    Consulted and Agree with Plan of Care Patient;Family member/caregiver    Family Member Consulted aide           Patient will benefit from skilled therapeutic intervention in order to improve the following deficits and impairments:  Abnormal gait,Cardiopulmonary status limiting activity,Decreased activity tolerance,Decreased balance,Decreased knowledge of precautions,Decreased endurance,Decreased  coordination,Decreased cognition,Decreased knowledge of use of DME,Decreased mobility,Difficulty walking,Decreased safety awareness,Decreased strength,Impaired flexibility,Impaired perceived functional ability,Impaired sensation,Postural dysfunction,Improper body mechanics,Pain,Impaired vision/preception,Decreased range of motion  Visit Diagnosis: Muscle weakness (generalized)  Unsteadiness on feet  Other abnormalities of gait and mobility  Chronic low back pain, unspecified back pain laterality, unspecified whether sciatica present  Difficulty in walking, not elsewhere classified     Problem List Patient Active Problem List   Diagnosis Date Noted  . AAA (abdominal aortic aneurysm) without rupture ([place]) 06/29/2020  . LBD (Lewy body dementia) ([place]) 05/14/2020  . Numbness and tingling in left hand 03/02/2020  . Hematuria   . Acute kidney injury superimposed on CKD ([place])   . Sepsis due to gram-negative UTI ([place]) 12/26/2019  . Left nephrolithiasis 12/26/2019  . Hydronephrosis, left 12/26/2019  . Status post abdominal aortic aneurysm (AAA) repair 04/15/2019  . Do not intubate, cardiopulmonary resuscitation (CPR)-only code status 10/08/2018  . Prostate cancer ([place]) 02/25/2018  . Prostatic cyst 01/15/2018  . Blood in stool   . Abnormal feces   . Stricture and stenosis of esophagus   . Mild cognitive impairment 08/18/2017  . Senile purpura ([place]) 04/29/2017  . Anemia 04/29/2017  . Frequent falls 04/29/2017  . Simple chronic bronchitis ([place]) 12/17/2016  . Benign localized hyperplasia of prostate with urinary obstruction 07/15/2016  . Elevated PSA 07/15/2016  . Incomplete emptying of bladder 07/15/2016  . Urge incontinence 07/15/2016  . Hypothyroidism 04/25/2016  . Macular degeneration 04/25/2016  . Erectile dysfunction due to arterial insufficiency   . Ischemic heart disease with chronotropic incompetence   . CN (constipation) 02/10/2015  . DD (diverticular disease) 02/10/2015   . Weakness 02/10/2015  . Lichen planus 02/40/9735  . Restless leg 02/10/2015  . Circadian rhythm  disorder 02/10/2015  . B12 deficiency 02/10/2015  . COPD (chronic obstructive pulmonary disease) ([place]) 11/14/2014  . Post Inflammatory Lung Changes 11/14/2014  . PAF (paroxysmal atrial fibrillation) (HCC) CHA2DS2-VASc = 4. AC = Eliquis   . Insomnia 12/09/2013  . OSA on CPAP   . Dyspnea on exertion - -essentially resolved with BB dose reduction & wgt loss 03/29/2013  . Overweight (BMI 25.0-29.9) -- Wgt back up 01/17/2013  . Hx of CABG   . Essential hypertension   . Hyperlipidemia with target LDL less than 70   . Aorto-iliac disease ([place])   . BCC (basal cell carcinoma), eyelid 01/03/2013  . Allergic rhinitis 08/17/2009  . Adaptation reaction 05/28/2009  . Acid reflux 05/28/2009  . Arthritis, degenerative 05/28/2009  . 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 Carotid artery occlusion without infarction 06/01/2006  . PAD (peripheral artery disease) - bilateral common iliac stents 12/15/2005  . Atherosclerotic heart disease of artery bypass graft 09/01/1997   10:27 AM, 11/01/20 Etta Grandchild, PT, DPT Physical Therapist - [facility]  Outpatient Physical Therapy- [place] [phone]     Etta Grandchild 11/01/2020, 10:24 AM  [place] MAIN [facility] SERVICES [address] [place], [place], [address] Phone: [phone]   Fax:  [phone]  Name: [name] MRN: [id number] Date of Birth: 30-Oct-1933

## 2020-11-01 NOTE — Therapy (Deleted)
[place] MAIN [facility] SERVICES [address] [place], [place], [address] Phone: [phone]   Fax:  [phone]  Physical Therapy Treatment  Patient Details  Name: [name] MRN: [id number] Date of Birth: 85-Apr-1935 Referring Provider (PT): Jennings Books K     Encounter Date: 11/01/2020   PT End of Session - 11/01/20 0949    Visit Number 27    Number of Visits 76    Date for PT Re-Evaluation 12/27/20    Authorization Type 6/10 PN 10/04/20    PT Start Time 0935    PT Stop Time 1015    PT Time Calculation (min) 40 min    Equipment Utilized During Treatment Gait belt    Activity Tolerance Patient tolerated treatment well    Behavior During Therapy WFL for tasks assessed/performed           Past Medical History:  Diagnosis Date   AAA (abdominal aortic aneurysm) ([place]) 05/2019   [place] Rapid progression from June-Aug 2020 (4.6 - 5.4 cm): s/p EVAR (Dr. Lucky Cowboy [facility]): EVAR - 23 mm prox, 12 cm distal & x 18 cm length Gore Excluder Endoprosthesis Main body (via RFA distal to lowest Renal A); 76m x 14 cm L Contralateral Limb for L Iliac - extended with 8 mm x 6cm LifeStar stent (post-dilated with 7 mm DEB).  Additional R Iliac PTA not required.    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 ([place] 09/15/2017   09-15-2017 DIAGNOSIS:  A. COLON POLYP X 2, ASCENDING; COLD SNARE:  - TUBULAR ADENOMA.  - NEGATIVE FOR HIGH-GRADE DYSPLASIA AND MALIGNANCY.  - NO ADDITIONAL TISSUE IDENTIFIED; FECAL MATERIAL PRESENT.   B. COLON POLYP X 2, DESCENDING; COLD SNARE:  - ADENOCARCINOMA ARISING IN A DYSPLASTIC SESSILE SERRATED ADENOMA, WITH  LYMPHOVASCULAR INVASION, SEE COMMENT.  - TUBULAR ADENOMA, 2 FRAGMENTS, NEGATIVE FO   Arthritis    Basal cell carcinoma of eyelid 01/03/2013   2019 - R side of Nose   Benign neoplasm of ascending colon    Benign neoplasm of descending colon    Bilateral iliac artery stenosis ([place] 2007   Bilateral Iliac A stenting; extension of EVAR  limbs into both Iliacs -- additional stent placed in L Iliac.   Cancer ([facility]    Skin cancer   Colon polyps    Complication of anesthesia    severe confusion agitation requiring hospital admission x 4 days   Compression fracture of fourth lumbar vertebra ([place] 04/2019   - s/p Kyphoplasty; T12, L3-5 ([facility]   COPD (chronic obstructive pulmonary disease) (HCC)    Centrilobular Emphysema   Coronary artery disease involving native coronary artery without angina pectoris 1998   - s/p CABG, occluded SVG-D1; patent LIMA-LAD, SVG-OM, SVG- RCA   Dementia ([place]    memory loss - started noting late 2019   Falls frequently    broke foot   Fracture 2021   left foot   GERD (gastroesophageal reflux disease)    Hemorrhoid 02/10/2015   History of hiatal hernia    History of kidney stones    HOH (hard of hearing)    Hypertension    [place]y body dementia ([place] 04/04/2020   Macular degeneration disease    Osteoporosis    PAF (paroxysmal atrial fibrillation) (HCC)    on Eliquis for OAC - Rate Control    Prostate cancer (HCC)    Prostate disorder    Sciatic pain    Chronic   Sleep apnea    cpap  Temporary cerebral vascular dysfunction 02/10/2015   Had negative work-up.  July, 2012.  No medication changes, had forgot them that day, got dehydrated.    Urinary incontinence     Past Surgical History:  Procedure Laterality Date   ABDOMINAL AORTIC ENDOVASCULAR STENT GRAFT Bilateral 05/18/2019   Procedure: ABDOMINAL AORTIC ENDOVASCULAR STENT GRAFT;  Surgeon: Algernon Huxley, MD;  Location: [facility] ORS;  Service: Vascular;  Laterality: Bilateral;   ANKLE SURGERY Left    ORIF    Arch aortogram and carotid aortogram  06/02/2006   Dr Oneida Alar did surgery   BASAL CELL CARCINOMA EXCISION  04/2016   Dermatology   CARDIAC CATHETERIZATION  01/30/1998    (Dr. Linard Millers): Native LAD & mRCA CTO. 90% D1. Mod Cx. SVG-D1 CTO. SVG-RCA & SVG-OM along with LIMA-LAD patent;  LV NORMAL Fxn    CAROTID ENDARTERECTOMY Left Oct. 29, 2007   Dr. Oneida Alar:   CATARACT EXTRACTION W/PHACO Right 03/05/2017   Procedure: CATARACT EXTRACTION PHACO AND INTRAOCULAR LENS PLACEMENT ([place]);  Surgeon: Leandrew Koyanagi, MD;  Location: [facility] ORS;  Service: Ophthalmology;  Laterality: Right;  [place] 00:58.5 AP% 10.6 CDE 6.20 Fluid Pack lot # [id number]   CATARACT EXTRACTION W/PHACO Left 04/15/2017   Procedure: CATARACT EXTRACTION PHACO AND INTRAOCULAR LENS PLACEMENT ([place])  Left;  Surgeon: Leandrew Koyanagi, MD;  Location: [place];  Service: Ophthalmology;  Laterality: Left;   COLONOSCOPY WITH PROPOFOL N/A 09/15/2017   Procedure: COLONOSCOPY WITH PROPOFOL;  Surgeon: Lucilla Lame, MD;  Location: [facility] ENDOSCOPY;  Service: Endoscopy;  Laterality: N/A;   COLONOSCOPY WITH PROPOFOL N/A 10/26/2018   Procedure: COLONOSCOPY WITH PROPOFOL;  Surgeon: Lucilla Lame, MD;  Location: [facility] ENDOSCOPY;  Service: Endoscopy;  Laterality: N/A;   CORONARY ARTERY BYPASS GRAFT  1998   LIMA-LAD, SVG-OM, SVG-RPDA, SVG-DIAG   CPET / MET  12/2012   Mild chronotropic incompetence - read 82% of predicted; also reduced effort; peak VO2 15.7 / 75% (did not reach Max effort) -- suggested ischemic response in last 1.5 minutes of exercise. Normal pulmonary function on PFTs but poor response to   CYSTOSCOPY W/ RETROGRADES Left 01/13/2020   Procedure: CYSTOSCOPY WITH RETROGRADE PYELOGRAM;  Surgeon: Billey Co, MD;  Location: [facility] ORS;  Service: Urology;  Laterality: Left;   CYSTOSCOPY WITH STENT PLACEMENT Left 12/26/2019   Procedure: CYSTOSCOPY WITH STENT PLACEMENT;  Surgeon: Billey Co, MD;  Location: [facility] ORS;  Service: Urology;  Laterality: Left;   CYSTOSCOPY/URETEROSCOPY/HOLMIUM LASER/STENT PLACEMENT Left 01/13/2020   Procedure: CYSTOSCOPY/URETEROSCOPY/HOLMIUM LASER/STENT EXCHANGE;  Surgeon: Billey Co, MD;  Location: [facility] ORS;  Service: Urology;  Laterality: Left;   ENDOVASCULAR REPAIR/STENT GRAFT N/A  05/18/2019   Procedure: ENDOVASCULAR REPAIR/STENT GRAFT;  Surgeon: Algernon Huxley, MD;  Location: [facility] INVASIVE CV LAB;; EVAR - 23 mm prox, 12 cm distal & x 18 cm length Gore Excluder Endoprosthesis Main body (via RFA distal to lowest Renal A); 8m x 14 cm L Contralateral Limb for L Iliac - extended with 8 mm x 6cm LifeStar stent; no additional R Iliac PTA needed.    ESOPHAGOGASTRODUODENOSCOPY (EGD) WITH PROPOFOL N/A 09/15/2017   Procedure: ESOPHAGOGASTRODUODENOSCOPY (EGD) WITH PROPOFOL;  Surgeon: WLucilla Lame MD;  [facility];  Service: Endoscopy;  Laterality: N/A;   FRACTURE SURGERY Right 03/19/2015   wrist   FRACTURE SURGERY Left    HIATAL HERNIA REPAIR     ILIAC ARTERY STENT  12/15/2005   PTA and direct stenting rgt and lft common iliac arteries   KYPHOPLASTY N/A 04/05/2019  Procedure: KYPHOPLASTY T12, L3, L4 L5;  Surgeon: Hessie Knows, MD;  Location: [facility] ORS;  Service: Orthopedics;  Laterality: N/A;   NM GATED MYOVIEW ([place] [facility])  05/'19; 8/'20   a) [facility]: Low Risk - no ischemia or infarct.  EF > 65%. no RWMA;; b) [facility]): Normal wall motion.  No ischemia or infarction.   OPEN REDUCTION INTERNAL FIXATION (ORIF) DISTAL RADIAL FRACTURE Left 01/13/2019   Procedure: OPEN REDUCTION INTERNAL FIXATION (ORIF) DISTAL  RADIAL FRACTURE - LEFT - SLEEP APNEA;  Surgeon: Hessie Knows, MD;  Location: [facility] ORS;  Service: Orthopedics;  Laterality: Left;   ORIF ELBOW FRACTURE Right 01/01/2017   Procedure: OPEN REDUCTION INTERNAL FIXATION (ORIF) ELBOW/OLECRANON FRACTURE;  Surgeon: Hessie Knows, MD;  Location: [facility] ORS;  Service: Orthopedics;  Laterality: Right;   ORIF WRIST FRACTURE Right 03/19/2015   Procedure: OPEN REDUCTION INTERNAL FIXATION (ORIF) WRIST FRACTURE;  Surgeon: Hessie Knows, MD;  Location: [facility] ORS;  Service: Orthopedics;  Laterality: Right;   SHOULDER ARTHROSCOPY Right    THORACIC AORTA - CAROTID ANGIOGRAM  October 2007   Dr. 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 Dilated left atrium.   TRANSTHORACIC ECHOCARDIOGRAM  04/2019   [facility] April 20, 2019): EF 55-60 %.  Mild LVH.  Relatively normal valves.    Vitals:   11/01/20 0947  Pulse: 65     Subjective Assessment - 11/01/20 0944    Subjective Pt with no significant changes since last session. Aide denies recent falls.    Patient is accompained by: --   Arsenio Katz   Pertinent History Patient is returning to PT for evaluation of balance. Patient has been seen by this therapist in the past. PMH AAA, arthritis, basal cell carcinoma of eyelid, benign neoplasm of ascending/descending colon, bilateral iliac artery stenosis, colon polyps, compression fx of 4th lumbar vertebra (s/p kyphoplasty T12, L3-5), COPD, CAD, dementia, falls,  GERD, hemorroids, HOH, HTN, Lewy Body Dementia, macular degeneration disease, osteoporosis, PAF, prostate cancer, prostate disorder, sciatic pain, sleep apnea, temporary cerebral vascular dysfunction. Comes with aide, having multiple near falls per day per aide, mainly to the sides.    Currently in Pain? No/denies           Interventions: - Amb 164f x3 with rollator and min guard, x2 seated rest breaks - Standing marching with BUE support, 4#, BLE x10ea - Standing HS curls with BUE support, 4#, BLE x10ea - Toe tap at treadmill with BUE support, 4#, BLE x10ea - Cone taps x182f- Lateral stepping 2x1541f Retro amb [id number] righting reaction strategies noted; required verbal/tactile cues for forward gait to correct balance - Standing ball toss (underhand and floor pass), 2min45mStanding ball toss at 45deg angle bil (underhand and floor pass), 2min 73mtanding ball toss to self, 2min -91m able to pick ball up off floor demonstrating wide BOS and appropriate anterior weight shift with min  guard     PT Education - 11/01/20 0946    Education provided Yes    Education Details exercise technique, body mechanics, safety with mobility    Person(s) Educated Patient;Caregiver(s)    Methods Explanation;Demonstration;Tactile cues;Verbal cues    Comprehension Need further instruction            PT Short Term Goals - 08/21/20 0959      PT SHORT TERM GOAL #1  Title Patient will be independent in home exercise program to improve strength/mobility for better functional independence with ADLs.    Baseline 11/11: HEP given 12/21: HEP compliant    Time 4    Period Weeks    Status Achieved    Target Date 08/09/20             PT Long Term Goals - 10/04/20 1235      PT LONG TERM GOAL #1   Title Patient will increase FOTO score to equal to or greater than 51% to demonstrate statistically significant improvement in mobility and quality of life.    Baseline 11/11: 42% 12/21: 46.8% 2/3: 36%    Time 12    Period Weeks    Status Partially Met    Target Date 12/27/20      PT LONG TERM GOAL #2   Title Patient will demonstrate an improved Berg Balance Score of > 40/56 as to demonstrate improved balance with ADLs such as sitting/standing and transfer balance and reduced fall risk.    Baseline 11/11: 34/56 12/21: 37/56 2/3: 35/56    Time 12    Period Weeks    Status On-going    Target Date 12/27/20      PT LONG TERM GOAL #3   Title Patient (> 37 years old) will complete five times sit to stand test in < 15 seconds without UE support indicating an increased LE strength and improved balance    Baseline 11/11; 23.35 seconds with UE support 12/21: 27.85 with one near LOB 2/3: 12.6 seconds with heavy BUE support due to confusion    Time 12    Period Weeks    Status On-going    Target Date 12/27/20      PT LONG TERM GOAL #4   Title Patient will increase 10 meter walk test to >1.44ms with LRAD  as to improve gait speed for better community ambulation and to reduce fall risk.     Baseline 11/11: 0.74 m/s without AD with one near LOB 12/21: 1.08 m/s    Time 12    Period Weeks    Status Achieved      PT LONG TERM GOAL #5   Title Patient will increase BLE gross strength to 4+/5 as to improve functional strength for independent gait, increased standing tolerance and increased ADL ability.    Baseline 11/11: see note 2/3: strength testing limited due to patient confusion    Time 12    Period Weeks    Status Partially Met    Target Date 12/27/20      PT LONG TERM GOAL #6   Title Patient will increase six minute walk test distance to >1000 for progression to community ambulator and improve gait ability    Baseline 2/3: able to perform 4 minutes for 420 ft with rollator    Time 12    Period Weeks    Status New    Target Date 12/27/20                 Plan - 11/01/20 0950    Clinical Impression Statement Pt tolerated treatment well today. Decreased R foot clearance during swing noted with fatigue during ambulation. Due to poor balance, pt required increased assist for standing exercise. Multiple LOB noted with retro amb without apparent righting reaction strategies, requiring increased assist and multimodal cues for safety. Gait deviations and poor balance increase the pt's risk of falls. Pt will continue to benefit from skilled physical therapy  services to address deficits and improve overall safety with functional mobility. Will continue per POC.          Patient will benefit from skilled therapeutic intervention in order to improve the following deficits and impairments:     Visit Diagnosis: No diagnosis found.     Problem List Patient Active Problem List   Diagnosis Date Noted   AAA (abdominal aortic aneurysm) without rupture ([place]) 06/29/2020   LBD (Lewy body dementia) ([place]) 05/14/2020   Numbness and tingling in left hand 03/02/2020   Hematuria    Acute kidney injury superimposed on CKD ([place])    Sepsis due to gram-negative UTI ([place])  12/26/2019   Left nephrolithiasis 12/26/2019   Hydronephrosis, left 12/26/2019   Status post abdominal aortic aneurysm (AAA) repair 04/15/2019   Do not intubate, cardiopulmonary resuscitation (CPR)-only code status 10/08/2018   Prostate cancer ([place]) 02/25/2018   Prostatic cyst 01/15/2018   Blood in stool    Abnormal feces    Stricture and stenosis of esophagus    Mild cognitive impairment 08/18/2017   Senile purpura ([place]) 04/29/2017   Anemia 04/29/2017   Frequent falls 04/29/2017   Simple chronic bronchitis ([place]) 12/17/2016   Benign localized hyperplasia of prostate with urinary obstruction 07/15/2016   Elevated PSA 07/15/2016   Incomplete emptying of bladder 07/15/2016   Urge incontinence 07/15/2016   Hypothyroidism 04/25/2016   Macular degeneration 04/25/2016   Erectile dysfunction due to arterial insufficiency    Ischemic heart disease with chronotropic incompetence    CN (constipation) 02/10/2015   DD (diverticular disease) 02/10/2015   Weakness 41/74/0814   Lichen planus 48/18/5631   Restless leg 02/10/2015   Circadian rhythm disorder 02/10/2015   B12 deficiency 02/10/2015   COPD (chronic obstructive pulmonary disease) ([place]) 11/14/2014   Post Inflammatory Lung Changes 11/14/2014   PAF (paroxysmal atrial fibrillation) (HCC) CHA2DS2-VASc = 4. AC = Eliquis    Insomnia 12/09/2013   OSA on CPAP    Dyspnea on exertion - -essentially resolved with BB dose reduction & wgt loss 03/29/2013   Overweight (BMI 25.0-29.9) -- Wgt back up 01/17/2013   Hx of CABG    Essential hypertension    Hyperlipidemia with target LDL less than 70    Aorto-iliac disease ([place])    BCC (basal cell carcinoma), eyelid 01/03/2013   Allergic rhinitis 08/17/2009   Adaptation reaction 05/28/2009   Acid reflux 05/28/2009   Arthritis, degenerative 05/28/2009   Abnormality of aortic arch branch 06/01/2006   Carotid artery occlusion without infarction  06/01/2006   PAD (peripheral artery disease) - bilateral common iliac stents 12/15/2005   Atherosclerotic heart disease of artery bypass graft 09/01/1997    , C 11/01/2020, 9:53 AM  [place], [place], [address] Phone: [phone]   Fax:  [phone]  Name: KERIN CECCHI MRN: [id number] Date of Birth: 02/25/1934

## 2020-11-02 ENCOUNTER — Other Ambulatory Visit: Payer: Self-pay | Admitting: Family Medicine

## 2020-11-02 DIAGNOSIS — K219 Gastro-esophageal reflux disease without esophagitis: Secondary | ICD-10-CM

## 2020-11-06 ENCOUNTER — Ambulatory Visit: Payer: Medicare Other

## 2020-11-06 ENCOUNTER — Other Ambulatory Visit: Payer: Self-pay

## 2020-11-06 DIAGNOSIS — R2689 Other abnormalities of gait and mobility: Secondary | ICD-10-CM

## 2020-11-06 DIAGNOSIS — M6281 Muscle weakness (generalized): Secondary | ICD-10-CM

## 2020-11-06 DIAGNOSIS — R2681 Unsteadiness on feet: Secondary | ICD-10-CM

## 2020-11-06 NOTE — Therapy (Signed)
[place] MAIN [facility] SERVICES [address] [place], [place], [address] Phone: [phone]   Fax:  [phone]  Physical Therapy Treatment  Patient Details  Name: [name] MRN: [id number] Date of Birth: 30-Apr-1934 Referring Provider (PT): Jennings Books K    Encounter Date: 11/06/2020   PT End of Session - 11/06/20 1223    Visit Number 28    Number of Visits 44    Date for PT Re-Evaluation 12/27/20    Authorization Type 8/10 PN 10/04/20    PT Start Time 0930    PT Stop Time 1013    PT Time Calculation (min) 43 min    Equipment Utilized During Treatment Gait belt    Activity Tolerance Patient tolerated treatment well    Behavior During Therapy WFL for tasks assessed/performed           Past Medical History:  Diagnosis Date  . AAA (abdominal aortic aneurysm) ([place]) 05/2019   [place] Rapid progression from June-Aug 2020 (4.6 - 5.4 cm): s/p EVAR (Dr. Lucky Cowboy [facility]): EVAR - 23 mm prox, 12 cm distal & x 18 cm length Gore Excluder Endoprosthesis Main body (via RFA distal to lowest Renal A); 23mm x 14 cm L Contralateral Limb for L Iliac - extended with 8 mm x 6cm LifeStar stent (post-dilated with 7 mm DEB).  Additional R Iliac PTA not required.   . 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 ([place]) 09/15/2017   09-15-2017 DIAGNOSIS:  A. COLON POLYP X 2, ASCENDING; COLD SNARE:  - TUBULAR ADENOMA.  - NEGATIVE FOR HIGH-GRADE DYSPLASIA AND MALIGNANCY.  - NO ADDITIONAL TISSUE IDENTIFIED; FECAL MATERIAL PRESENT.   B. COLON POLYP X 2, DESCENDING; COLD SNARE:  - ADENOCARCINOMA ARISING IN A DYSPLASTIC SESSILE SERRATED ADENOMA, WITH  LYMPHOVASCULAR INVASION, SEE COMMENT.  - TUBULAR ADENOMA, 2 FRAGMENTS, NEGATIVE FO  . Arthritis   . Basal cell carcinoma of eyelid 01/03/2013   2019 - R side of Nose  . Benign neoplasm of ascending colon   . Benign neoplasm of descending colon   . Bilateral iliac artery stenosis ([place]) 2007   Bilateral Iliac A stenting; extension of EVAR  limbs into both Iliacs -- additional stent placed in L Iliac.  . Cancer ([place])    Skin cancer  . Colon polyps   . Complication of anesthesia    severe confusion agitation requiring hospital admission x 4 days  . Compression fracture of fourth lumbar vertebra ([place]) 04/2019   - s/p Kyphoplasty; T12, L3-5 [facility])  . COPD (chronic obstructive pulmonary disease) (HCC)    Centrilobular Emphysema  . Coronary artery disease involving native coronary artery without angina pectoris 1998   - s/p CABG, occluded SVG-D1; patent LIMA-LAD, SVG-OM, SVG- RCA  . Dementia ([place])    memory loss - started noting late 2019  . Falls frequently    broke foot  . Fracture 2021   left foot  . GERD (gastroesophageal reflux disease)   . Hemorrhoid 02/10/2015  . History of hiatal hernia   . History of kidney stones   . HOH (hard of hearing)   . Hypertension   . Lewy body dementia ([place]) 04/04/2020  . Macular degeneration disease   . Osteoporosis   . PAF (paroxysmal atrial fibrillation) ([place])    on Eliquis for OAC - Rate Control   . Prostate cancer ([place])   . Prostate disorder   . Sciatic pain    Chronic  . Sleep apnea    cpap  .  Temporary cerebral vascular dysfunction 02/10/2015   Had negative work-up.  July, 2012.  No medication changes, had forgot them that day, got dehydrated.   . Urinary incontinence     Past Surgical History:  Procedure Laterality Date  . ABDOMINAL AORTIC ENDOVASCULAR STENT GRAFT Bilateral 05/18/2019   Procedure: ABDOMINAL AORTIC ENDOVASCULAR STENT GRAFT;  Surgeon: Algernon Huxley, MD;  Location: [facility] ORS;  Service: Vascular;  Laterality: Bilateral;  . ANKLE SURGERY Left    ORIF   . Arch aortogram and carotid aortogram  06/02/2006   Dr Oneida Alar did surgery  . BASAL CELL CARCINOMA EXCISION  04/2016   Dermatology  . CARDIAC CATHETERIZATION  01/30/1998    (Dr. Linard Millers): Native LAD & mRCA CTO. 90% D1. Mod Cx. SVG-D1 CTO. SVG-RCA & SVG-OM along with LIMA-LAD patent;  LV NORMAL Fxn  .  CAROTID ENDARTERECTOMY Left Oct. 29, 2007   Dr. Oneida Alar:  . CATARACT EXTRACTION W/PHACO Right 03/05/2017   Procedure: CATARACT EXTRACTION PHACO AND INTRAOCULAR LENS PLACEMENT (IOC);  Surgeon: Leandrew Koyanagi, MD;  Location: [facility] ORS;  Service: Ophthalmology;  Laterality: Right;  [place] 00:58.5 AP% 10.6 CDE 6.20 Fluid Pack lot # [id number]  . CATARACT EXTRACTION W/PHACO Left 04/15/2017   Procedure: CATARACT EXTRACTION PHACO AND INTRAOCULAR LENS PLACEMENT ([place])  Left;  Surgeon: Leandrew Koyanagi, MD;  Location: [place];  Service: Ophthalmology;  Laterality: Left;  . COLONOSCOPY WITH PROPOFOL N/A 09/15/2017   Procedure: COLONOSCOPY WITH PROPOFOL;  Surgeon: Lucilla Lame, MD;  Location: [facility] ENDOSCOPY;  Service: Endoscopy;  Laterality: N/A;  . COLONOSCOPY WITH PROPOFOL N/A 10/26/2018   Procedure: COLONOSCOPY WITH PROPOFOL;  Surgeon: Lucilla Lame, MD;  Location: [facility] ENDOSCOPY;  Service: Endoscopy;  Laterality: N/A;  . CORONARY ARTERY BYPASS GRAFT  1998   LIMA-LAD, SVG-OM, SVG-RPDA, SVG-DIAG  . CPET / MET  12/2012   Mild chronotropic incompetence - read 82% of predicted; also reduced effort; peak VO2 15.7 / 75% (did not reach Max effort) -- suggested ischemic response in last 1.5 minutes of exercise. Normal pulmonary function on PFTs but poor response to  . CYSTOSCOPY W/ RETROGRADES Left 01/13/2020   Procedure: CYSTOSCOPY WITH RETROGRADE PYELOGRAM;  Surgeon: Billey Co, MD;  Location: [facility] ORS;  Service: Urology;  Laterality: Left;  . CYSTOSCOPY WITH STENT PLACEMENT Left 12/26/2019   Procedure: CYSTOSCOPY WITH STENT PLACEMENT;  Surgeon: Billey Co, MD;  Location: [facility] ORS;  Service: Urology;  Laterality: Left;  . CYSTOSCOPY/URETEROSCOPY/HOLMIUM LASER/STENT PLACEMENT Left 01/13/2020   Procedure: CYSTOSCOPY/URETEROSCOPY/HOLMIUM LASER/STENT EXCHANGE;  Surgeon: Billey Co, MD;  Location: [facility] ORS;  Service: Urology;  Laterality: Left;  . ENDOVASCULAR REPAIR/STENT GRAFT N/A  05/18/2019   Procedure: ENDOVASCULAR REPAIR/STENT GRAFT;  Surgeon: Algernon Huxley, MD;  Location: [facility] INVASIVE CV LAB;; EVAR - 23 mm prox, 12 cm distal & x 18 cm length Gore Excluder Endoprosthesis Main body (via RFA distal to lowest Renal A); 6m x 14 cm L Contralateral Limb for L Iliac - extended with 8 mm x 6cm LifeStar stent; no additional R Iliac PTA needed.   . ESOPHAGOGASTRODUODENOSCOPY (EGD) WITH PROPOFOL N/A 09/15/2017   Procedure: ESOPHAGOGASTRODUODENOSCOPY (EGD) WITH PROPOFOL;  Surgeon: WLucilla Lame MD;  [facility];  Service: Endoscopy;  Laterality: N/A;  . FRACTURE SURGERY Right 03/19/2015   wrist  . FRACTURE SURGERY Left   . HIATAL HERNIA REPAIR    . ILIAC ARTERY STENT  12/15/2005   PTA and direct stenting rgt and lft common iliac arteries  . KYPHOPLASTY N/A 04/05/2019  Procedure: KYPHOPLASTY T12, L3, L4 L5;  Surgeon: Kennedy Bucker, MD;  Location: [facility] ORS;  Service: Orthopedics;  Laterality: N/A;  . NM GATED MYOVIEW ([facility] HX)  05/'19; 8/'20   a) [facility]: Low Risk - no ischemia or infarct.  EF > 65%. no RWMA;; b) [facility]): Normal wall motion.  No ischemia or infarction.  . OPEN REDUCTION INTERNAL FIXATION (ORIF) DISTAL RADIAL FRACTURE Left 01/13/2019   Procedure: OPEN REDUCTION INTERNAL FIXATION (ORIF) DISTAL  RADIAL FRACTURE - LEFT - SLEEP APNEA;  Surgeon: Kennedy Bucker, MD;  Location: [facility] ORS;  Service: Orthopedics;  Laterality: Left;  . ORIF ELBOW FRACTURE Right 01/01/2017   Procedure: OPEN REDUCTION INTERNAL FIXATION (ORIF) ELBOW/OLECRANON FRACTURE;  Surgeon: Kennedy Bucker, MD;  Location: [facility] ORS;  Service: Orthopedics;  Laterality: Right;  . ORIF WRIST FRACTURE Right 03/19/2015   Procedure: OPEN REDUCTION INTERNAL FIXATION (ORIF) WRIST FRACTURE;  Surgeon: Kennedy Bucker, MD;  Location: [facility] ORS;  Service: Orthopedics;  Laterality: Right;  . SHOULDER ARTHROSCOPY Right   . THORACIC AORTA - CAROTID ANGIOGRAM  October 2007   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 TRANSTHORACIC ECHOCARDIOGRAM  February 2016   [facility]: Normal LV function. Dilated left atrium.  Krystal Clark ECHOCARDIOGRAM  04/2019   [facility] April 20, 2019): EF 55-60 %.  Mild LVH.  Relatively normal valves.    There were no vitals filed for this visit.   Subjective Assessment - 11/06/20 1222    Subjective Patient presents with his girlfriend and grandson. Patient's family reports decline of cognition recently and may have to start getting therapy at memory care unit if it does not improve.    Patient is accompained by: --   Carmie End   Pertinent History Patient is returning to PT for evaluation of balance. Patient has been seen by this therapist in the past. PMH AAA, arthritis, basal cell carcinoma of eyelid, benign neoplasm of ascending/descending colon, bilateral iliac artery stenosis, colon polyps, compression fx of 4th lumbar vertebra (s/p kyphoplasty T12, L3-5), COPD, CAD, dementia, falls,  GERD, hemorroids, HOH, HTN, Lewy Body Dementia, macular degeneration disease, osteoporosis, PAF, prostate cancer, prostate disorder, sciatic pain, sleep apnea, temporary cerebral vascular dysfunction. Comes with aide, having multiple near falls per day per aide, mainly to the sides.    Limitations Walking;Standing;House hold activities;Other (comment);Lifting;Sitting    How long can you sit comfortably? n/a back pain no longer limiting    How long can you stand comfortably? 10-12 with walker    How long can you walk comfortably? walks 15 minutes with a walker    Diagnostic tests MRI from [facility] imaging Center dated 03/28/2019 report was reviewed today. Acute compression fractures are noted at T12, L3, and L4. He has facet arthropathy at the lower lumbar levels. At L3-4 he has moderate central stenosis.    Patient Stated Goals walk without stumbling, be able to be steady enough to not have  to have someone holding his gait belt.    Currently in Pain? No/denies                vitals at start of session 105/61 pulse 52           In // bars:  4lb ankle weight: -high knee march 4x length of // bars; visual cueing required -side stepping 4x length of // bars, cues for neutral hip alignment for optimal lateral muscle recruitment.  -hip extension  10x each LE   6" step toe taps 10x each LE, BUE support   Stand and reach for ball, throw into hoop x 15 balls; second set with airex pad under feet.  Standing balloon taps reaching inside/outside BOS x 4 minutes   Orange hurdle; step over and back 10x, BUE support, challenging with spatial awareness of RLE   bosu ball: round side up: modified forward lunge 10x each LE.       hedgehog squish/stomp 10x each LE, BUE support   10x STS; BUE support  Seated:   soccer ball kicks 2 minutes for coordination, sequencing, and spatial awareness.  Seated balloon taps for righting reactions, coordination, and spatial awareness x 4 minutes.   Pt educated throughout session about proper posture and technique with exercises. Improved exercise technique, movement at target joints, use of target muscles after min to mod verbal, visual, tactile cues  Patient is more confused this session than previous sessions requiring more frequent cueing and closer contact. Familiar tasks are easier for patient to perform with less confusion and less agitation.  Patient will continue to benefit from skilled physical therapy to improve generalized strength, ROM, and capacity for functional activity.                      PT Education - 11/06/20 1223    Education provided Yes    Education Details exercise technique, body mechanics,    Person(s) Educated Patient    Methods Explanation;Demonstration;Tactile cues;Verbal cues    Comprehension Verbalized understanding;Returned demonstration;Verbal cues required;Tactile cues required             PT Short Term Goals - 08/21/20 0959      PT SHORT TERM GOAL #1   Title Patient will be independent in home exercise program to improve strength/mobility for better functional independence with ADLs.    Baseline 11/11: HEP given 12/21: HEP compliant    Time 4    Period Weeks    Status Achieved    Target Date 08/09/20             PT Long Term Goals - 10/04/20 1235      PT LONG TERM GOAL #1   Title Patient will increase FOTO score to equal to or greater than 51% to demonstrate statistically significant improvement in mobility and quality of life.    Baseline 11/11: 42% 12/21: 46.8% 2/3: 36%    Time 12    Period Weeks    Status Partially Met    Target Date 12/27/20      PT LONG TERM GOAL #2   Title Patient will demonstrate an improved Berg Balance Score of > 40/56 as to demonstrate improved balance with ADLs such as sitting/standing and transfer balance and reduced fall risk.    Baseline 11/11: 34/56 12/21: 37/56 2/3: 35/56    Time 12    Period Weeks    Status On-going    Target Date 12/27/20      PT LONG TERM GOAL #3   Title Patient (> 78 years old) will complete five times sit to stand test in < 15 seconds without UE support indicating an increased LE strength and improved balance    Baseline 11/11; 23.35 seconds with UE support 12/21: 27.85 with one near LOB 2/3: 12.6 seconds with heavy BUE support due to confusion    Time 12    Period Weeks    Status On-going    Target Date 12/27/20  PT LONG TERM GOAL #4   Title Patient will increase 10 meter walk test to >1.68m/s with LRAD  as to improve gait speed for better community ambulation and to reduce fall risk.    Baseline 11/11: 0.74 m/s without AD with one near LOB 12/21: 1.08 m/s    Time 12    Period Weeks    Status Achieved      PT LONG TERM GOAL #5   Title Patient will increase BLE gross strength to 4+/5 as to improve functional strength for independent gait, increased standing tolerance and increased ADL  ability.    Baseline 11/11: see note 2/3: strength testing limited due to patient confusion    Time 12    Period Weeks    Status Partially Met    Target Date 12/27/20      PT LONG TERM GOAL #6   Title Patient will increase six minute walk test distance to >1000 for progression to community ambulator and improve gait ability    Baseline 2/3: able to perform 4 minutes for 420 ft with rollator    Time 12    Period Weeks    Status New    Target Date 12/27/20                 Plan - 11/06/20 1226    Clinical Impression Statement Patient is more confused this session than previous sessions requiring more frequent cueing and closer contact. Familiar tasks are easier for patient to perform with less confusion and less agitation.  Patient will continue to benefit from skilled physical therapy to improve generalized strength, ROM, and capacity for functional activity.    Personal Factors and Comorbidities Age;Comorbidity 3+;Fitness;Past/Current Experience;Sex;Social Background;Time since onset of injury/illness/exacerbation;Transportation    Comorbidities AAA, arthritis, atherosclerotic heart disease, (s/p CABG), benign neoplasm of ascending/descending colon, COPD, CAD, dementia, dysrhythmia, frequent falls, GERD, hemorrhoid, HOH, HTN, PAF, prostate cancer.    Examination-Activity Limitations Bathing;Bend;Carry;Dressing;Continence;Stairs;Squat;Reach Overhead;Locomotion Level;Lift;Stand;Transfers;Toileting;Bed Mobility    Examination-Participation Restrictions Church;Cleaning;Community Activity;Driving;Interpersonal Relationship;Laundry;Volunteer;Shop;Personal Finances;Meal Prep;Yard Work    Product manager    Rehab Potential Fair    Clinical Impairments Affecting Rehab Potential (+) good support system, high prior level of function (-) currently undergoing radiation therapy for prostate cancer, memory deficits     PT Frequency 2x / week    PT  Duration 12 weeks    PT Treatment/Interventions ADLs/Self Care Home Management;Aquatic Therapy;Cryotherapy;Moist Heat;Traction;Ultrasound;Therapeutic activities;Functional mobility training;Stair training;Gait training;DME Instruction;Therapeutic exercise;Balance training;Neuromuscular re-education;Patient/family education;Manual techniques;Passive range of motion;Energy conservation;Vestibular;Taping;Biofeedback;Electrical Stimulation;Iontophoresis 4mg /ml Dexamethasone;Dry needling;Visual/perceptual remediation/compensation;Cognitive remediation;Canalith Repostioning    PT Next Visit Plan balance, strength    PT Home Exercise Plan see above    Consulted and Agree with Plan of Care Patient;Family member/caregiver    Family Member Consulted aide           Patient will benefit from skilled therapeutic intervention in order to improve the following deficits and impairments:  Abnormal gait,Cardiopulmonary status limiting activity,Decreased activity tolerance,Decreased balance,Decreased knowledge of precautions,Decreased endurance,Decreased coordination,Decreased cognition,Decreased knowledge of use of DME,Decreased mobility,Difficulty walking,Decreased safety awareness,Decreased strength,Impaired flexibility,Impaired perceived functional ability,Impaired sensation,Postural dysfunction,Improper body mechanics,Pain,Impaired vision/preception,Decreased range of motion  Visit Diagnosis: Muscle weakness (generalized)  Unsteadiness on feet  Other abnormalities of gait and mobility     Problem List Patient Active Problem List   Diagnosis Date Noted  . AAA (abdominal aortic aneurysm) without rupture ([place]) 06/29/2020  . LBD (Lewy body dementia) ([place]) 05/14/2020  . Numbness and tingling in left hand 03/02/2020  . Hematuria   .  Acute kidney injury superimposed on CKD ([place])   . Sepsis due to gram-negative UTI ([place]) 12/26/2019  . Left nephrolithiasis 12/26/2019  . Hydronephrosis, left 12/26/2019  .  Status post abdominal aortic aneurysm (AAA) repair 04/15/2019  . Do not intubate, cardiopulmonary resuscitation (CPR)-only code status 10/08/2018  . Prostate cancer ([place]) 02/25/2018  . Prostatic cyst 01/15/2018  . Blood in stool   . Abnormal feces   . Stricture and stenosis of esophagus   . Mild cognitive impairment 08/18/2017  . Senile purpura ([place]) 04/29/2017  . Anemia 04/29/2017  . Frequent falls 04/29/2017  . Simple chronic bronchitis ([place]) 12/17/2016  . Benign localized hyperplasia of prostate with urinary obstruction 07/15/2016  . Elevated PSA 07/15/2016  . Incomplete emptying of bladder 07/15/2016  . Urge incontinence 07/15/2016  . Hypothyroidism 04/25/2016  . Macular degeneration 04/25/2016  . Erectile dysfunction due to arterial insufficiency   . Ischemic heart disease with chronotropic incompetence   . CN (constipation) 02/10/2015  . DD (diverticular disease) 02/10/2015  . Weakness 02/10/2015  . Lichen planus 70/34/0352  . Restless leg 02/10/2015  . 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 B12 deficiency 02/10/2015  . COPD (chronic obstructive pulmonary disease) ([place]) 11/14/2014  . Post Inflammatory Lung Changes 11/14/2014  . PAF (paroxysmal atrial fibrillation) (HCC) CHA2DS2-VASc = 4. AC = Eliquis   . Insomnia 12/09/2013  . OSA on CPAP   . Dyspnea on exertion - -essentially resolved with BB dose reduction & wgt loss 03/29/2013  . Overweight (BMI 25.0-29.9) -- Wgt back up 01/17/2013  . Hx of CABG   . Essential hypertension   . Hyperlipidemia with target LDL less than 70   . Aorto-iliac disease ([place])   . BCC (basal cell carcinoma), eyelid 01/03/2013  . Allergic rhinitis 08/17/2009  . Adaptation reaction 05/28/2009  . Acid reflux 05/28/2009  . Arthritis, degenerative 05/28/2009  . 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 Carotid artery occlusion without infarction 06/01/2006  . PAD (peripheral artery disease) - bilateral common iliac stents 12/15/2005  .  Atherosclerotic heart disease of artery bypass graft 09/01/1997   Janna Arch, PT, DPT   11/06/2020, 12:28 PM  [place] [facility]IN [facility] SERVICES [address] [place], [place], [address] Phone: [phone]   Fax:  [phone]  Name: [name] MRN: [id number] Date of Birth: 11/10/1933

## 2020-11-07 ENCOUNTER — Ambulatory Visit (INDEPENDENT_AMBULATORY_CARE_PROVIDER_SITE_OTHER): Payer: Medicare Other | Admitting: Family Medicine

## 2020-11-07 VITALS — BP 132/75 | HR 58 | Temp 96.8°F | Ht 71.0 in | Wt 174.0 lb

## 2020-11-07 DIAGNOSIS — C61 Malignant neoplasm of prostate: Secondary | ICD-10-CM

## 2020-11-07 DIAGNOSIS — R4689 Other symptoms and signs involving appearance and behavior: Secondary | ICD-10-CM | POA: Diagnosis not present

## 2020-11-07 DIAGNOSIS — I10 Essential (primary) hypertension: Secondary | ICD-10-CM | POA: Diagnosis not present

## 2020-11-07 LAB — POCT URINALYSIS DIPSTICK
Appearance: NORMAL
Bilirubin, UA: NEGATIVE
Blood, UA: NEGATIVE
Glucose, UA: NEGATIVE
Ketones, UA: NEGATIVE
Leukocytes, UA: NEGATIVE
Nitrite, UA: NEGATIVE
Odor: NORMAL
Protein, UA: NEGATIVE
Spec Grav, UA: 1.01 (ref 1.010–1.025)
Urobilinogen, UA: 0.2 E.U./dL
pH, UA: 6 (ref 5.0–8.0)

## 2020-11-07 NOTE — Progress Notes (Incomplete)
Established patient visit   Patient: [name]   DOB: 07-Jul-1934   87 y.o. Male  MRN: [id number] Visit Date: 11/07/2020  Today's healthcare provider: Lelon Huh, MD   No chief complaint on file.  Subjective    HPI  Hypertension, follow-up  BP Readings from Last 3 Encounters:  10/10/20 131/79  09/27/20 (!) 168/94  08/25/20 (!) 188/79   Wt Readings from Last 3 Encounters:  10/10/20 173 lb (78.5 kg)  09/27/20 175 lb (79.4 kg)  08/01/20 172 lb (78 kg)     He was last seen for hypertension 1 months ago.  BP at that visit was 131/79. Management since that visit includes reducing metoprolol ER to 25mg  daily due to orthostasis.   He reports excellent compliance with treatment. He is not having side effects.  He is following a Regular diet. He is exercising. - Physical therapy He does not smoke.  Use of agents associated with hypertension: none.   Outside blood pressures are 120-130/70s. Symptoms: No chest pain No chest pressure  No palpitations No syncope  No dyspnea No orthopnea  No paroxysmal nocturnal dyspnea No lower extremity edema   Pertinent labs: Lab Results  Component Value Date   CHOL 119 12/24/2019   HDL 39 (L) 12/24/2019   LDLCALC 62 12/24/2019   TRIG 90 12/24/2019   CHOLHDL 3.1 12/24/2019   Lab Results  Component Value Date   NA 140 09/27/2020   K 4.3 09/27/2020   CREATININE 1.07 09/27/2020   GFRNONAA >60 09/27/2020   GFRAA 82 05/14/2020   GLUCOSE 64 (L) 09/27/2020     The ASCVD Risk score (Goff DC Jr., et al., 2013) failed to calculate for the following reasons:   The 2013 ASCVD risk score is only valid for ages 88 to 67   ---------------------------------------------------------------------------------------------------  {Show patient history (optional):23778::" "}    Medications: Outpatient Medications Prior to Visit  Medication Sig  . acetaminophen (TYLENOL) 500 MG tablet Take 500-1,000 mg by mouth every 6 (six) hours  as needed for mild pain or moderate pain.   [name] ELIQUIS 5 MG TABS tablet TAKE ONE TABLET TWICE DAILY  . ezetimibe (ZETIA) 10 MG tablet TAKE 1 TABLET BY MOUTH DAILY  . finasteride (PROSCAR) 5 MG tablet TAKE ONE TABLET BY MOUTH EVERY DAY  . fluticasone (FLONASE) 50 MCG/ACT nasal spray PLACE TWO SPRAYS IN BOTH NOSRTILS EVERY DAY AS NEEDED FOR ALLERGIES  . lisinopril (ZESTRIL) 2.5 MG tablet TAKE ONE TABLET BY MOUTH TWICE DAILY  . Magnesium Oxide 500 MG TABS Take 500 mg by mouth every evening.  . melatonin 3 MG TABS tablet Take 0.5 tablets (1.5 mg total) by mouth at bedtime.  . metoprolol succinate (TOPROL-XL) 25 MG 24 hr tablet Take 1 tablet (25 mg total) by mouth 2 (two) times daily. Take with or immediately following a meal. (Patient taking differently: Take 12.5 mg by mouth 2 (two) times daily. Take with or immediately following a meal.)  . Multiple Vitamins-Minerals (PRESERVISION AREDS 2 PO) Take 1 tablet by mouth 2 (two) times daily.   [name] MYRBETRIQ 50 MG TB24 tablet TAKE ONE TABLET BY MOUTH EVERY DAY  . pantoprazole (PROTONIX) 40 MG tablet TAKE ONE TABLET BY MOUTH EVERY DAY  . QUEtiapine (SEROQUEL) 25 MG tablet Take 12.5 mg by mouth.   . rosuvastatin (CRESTOR) 20 MG tablet TAKE ONE TABLET BY MOUTH EVERY DAY  . umeclidinium-vilanterol (ANORO ELLIPTA) 62.5-25 MCG/INH AEPB Inhale 1 puff into the lungs daily.   . [  DISCONTINUED] nitroGLYCERIN (NITROSTAT) 0.4 MG SL tablet Place 1 tablet (0.4 mg total) under the tongue every 5 (five) minutes as needed for chest pain.   No facility-administered medications prior to visit.    Review of Systems  {Labs  Heme  Chem  Endocrine  Serology  Results Review (optional):23779::" "}   Objective    There were no vitals taken for this visit. {Show previous vital signs (optional):23777::" "}   Physical Exam  ***  No results found for any visits on 11/07/20.  Assessment & Plan     ***  No follow-ups on file.      {provider  attestation***:1}   Lelon Huh, MD  [facility] [phone] (phone) [phone] (fax)  [place]

## 2020-11-07 NOTE — Progress Notes (Addendum)
Established patient visit   Patient: [name]   DOB: 10/01/1933   87 y.o. Male  MRN: [id number] Visit Date: 11/07/2020  Today's healthcare provider: Lelon Huh, MD   No chief complaint on file.  Subjective    HPI  Hypertension, follow-up  BP Readings from Last 3 Encounters:  10/10/20 131/79  09/27/20 (!) 168/94  08/25/20 (!) 188/79   Wt Readings from Last 3 Encounters:  10/10/20 173 lb (78.5 kg)  09/27/20 175 lb (79.4 kg)  08/01/20 172 lb (78 kg)     He was last seen for hypertension 1 months ago.  BP at that visit was 131/79. Management since that visit includes reducing metoprolol ER to 25mg  daily due to orthostasis.   He reports excellent compliance with treatment. He is not having side effects.  He is following a Regular diet. He is exercising. - Physical therapy He does not smoke.  Use of agents associated with hypertension: none.   Outside blood pressures are 120-130/70s. Symptoms: No chest pain No chest pressure  No palpitations No syncope  No dyspnea No orthopnea  No paroxysmal nocturnal dyspnea No lower extremity edema   Pertinent labs: Lab Results  Component Value Date   CHOL 119 12/24/2019   HDL 39 (L) 12/24/2019   LDLCALC 62 12/24/2019   TRIG 90 12/24/2019   CHOLHDL 3.1 12/24/2019   Lab Results  Component Value Date   NA 140 09/27/2020   K 4.3 09/27/2020   CREATININE 1.07 09/27/2020   GFRNONAA >60 09/27/2020   GFRAA 82 05/14/2020   GLUCOSE 64 (L) 09/27/2020     The ASCVD Risk score (Goff DC Jr., et al., 2013) failed to calculate for the following reasons:   The 2013 ASCVD risk score is only valid for ages 58 to 46   ---------------------------------------------------------------------------------------------------      Medications: Outpatient Medications Prior to Visit  Medication Sig  . acetaminophen (TYLENOL) 500 MG tablet Take 500-1,000 mg by mouth every 6 (six) hours as needed for mild pain or moderate pain.    [name] ELIQUIS 5 MG TABS tablet TAKE ONE TABLET TWICE DAILY  . ezetimibe (ZETIA) 10 MG tablet TAKE 1 TABLET BY MOUTH DAILY  . finasteride (PROSCAR) 5 MG tablet TAKE ONE TABLET BY MOUTH EVERY DAY  . fluticasone (FLONASE) 50 MCG/ACT nasal spray PLACE TWO SPRAYS IN BOTH NOSRTILS EVERY DAY AS NEEDED FOR ALLERGIES  . lisinopril (ZESTRIL) 2.5 MG tablet TAKE ONE TABLET BY MOUTH TWICE DAILY  . Magnesium Oxide 500 MG TABS Take 500 mg by mouth every evening.  . melatonin 3 MG TABS tablet Take 0.5 tablets (1.5 mg total) by mouth at bedtime.  . metoprolol succinate (TOPROL-XL) 25 MG 24 hr tablet Take 1 tablet (25 mg total) by mouth 2 (two) times daily. Take with or immediately following a meal.  . Multiple Vitamins-Minerals (PRESERVISION AREDS 2 PO) Take 1 tablet by mouth 2 (two) times daily.   [name] MYRBETRIQ 50 MG TB24 tablet TAKE ONE TABLET BY MOUTH EVERY DAY  . nitroGLYCERIN (NITROSTAT) 0.4 MG SL tablet Place 1 tablet (0.4 mg total) under the tongue every 5 (five) minutes as needed for chest pain.  . pantoprazole (PROTONIX) 40 MG tablet TAKE ONE TABLET BY MOUTH EVERY DAY  . QUEtiapine (SEROQUEL) 25 MG tablet Take 12.5 mg by mouth.   . rosuvastatin (CRESTOR) 20 MG tablet TAKE ONE TABLET BY MOUTH EVERY DAY  . umeclidinium-vilanterol (ANORO ELLIPTA) 62.5-25 MCG/INH AEPB Inhale 1 puff into the  lungs daily.    No facility-administered medications prior to visit.        Objective    BP 132/75 (BP Location: Right Arm, Patient Position: Sitting, Cuff Size: Normal)   Pulse (!) 58   Temp (!) 96.8 F (36 C) (Temporal)   Ht 5\' 11"  (1.803 m)   Wt 174 lb (78.9 kg)   SpO2 100%   BMI 24.27 kg/m     Physical Exam   General: Appearance:    Well developed, well nourished male in no acute distress  Eyes:    PERRL, conjunctiva/corneas clear, EOM's intact       Lungs:     Clear to auscultation bilaterally, respirations unlabored  Heart:    Bradycardic. Irregularly irregular rhythm. No murmurs, rubs, or gallops.    MS:   All extremities are intact.   Neurologic:   Awake, alert, oriented x 3. No apparent focal neurological           defect.         Results for orders placed or performed in visit on 11/07/20  POCT Urinalysis Dipstick  Result Value Ref Range   Color, UA yellow    Clarity, UA clear    Glucose, UA Negative Negative   Bilirubin, UA Negative    Ketones, UA Negative    Spec Grav, UA 1.010 1.010 - 1.025   Blood, UA negative    pH, UA 6.0 5.0 - 8.0   Protein, UA Negative Negative   Urobilinogen, UA 0.2 0.2 or 1.0 E.U./dL   Nitrite, UA negative    Leukocytes, UA Negative Negative   Appearance normal    Odor normal      Assessment & Plan     1. Essential hypertension Well controlled.  Continue current medications.    2. Prostate cancer ([place])  - PSA Follow up Dr. Imelda Pillow as scheduled.   3. Behavior concern Family concerned may have UTI as he has been a bit more confused at times but u/a is negative.   4. Generalized weakness He is interested in starting back on physical therapy due to difficulty ambulating and balance difficulties. He does not have reliable transportation and has difficulties getting out. His son is going to look into home health agencies that could get him into physical therapy at home place.      The entirety of the information documented in the History of Present Illness, Review of Systems and Physical Exam were personally obtained by me. Portions of this information were initially documented by the CMA and reviewed by me for thoroughness and accuracy.      Lelon Huh, MD  [facility] [phone] (phone) [phone] (fax)  [place]

## 2020-11-08 ENCOUNTER — Other Ambulatory Visit: Payer: Self-pay

## 2020-11-08 ENCOUNTER — Ambulatory Visit: Payer: Medicare Other

## 2020-11-08 DIAGNOSIS — M6281 Muscle weakness (generalized): Secondary | ICD-10-CM | POA: Diagnosis not present

## 2020-11-08 DIAGNOSIS — R2689 Other abnormalities of gait and mobility: Secondary | ICD-10-CM

## 2020-11-08 DIAGNOSIS — R2681 Unsteadiness on feet: Secondary | ICD-10-CM

## 2020-11-08 LAB — PSA: Prostate Specific Ag, Serum: <0.1 ng/mL (ref 0.0–4.0)

## 2020-11-08 NOTE — Therapy (Signed)
[place] MAIN [facility] SERVICES [address] [place], [place], [address] Phone: [phone]   Fax:  [phone]  Physical Therapy Treatment  Patient Details  Name: [name] MRN: [id number] Date of Birth: August 10, 1934 Referring Provider (PT): Jennings Books K    Encounter Date: 11/08/2020   PT End of Session - 11/08/20 0916    Visit Number 29    Number of Visits 44    Date for PT Re-Evaluation 12/27/20    Authorization Type 9/10 PN 10/04/20    PT Start Time 0930    PT Stop Time 1014    PT Time Calculation (min) 44 min    Equipment Utilized During Treatment Gait belt    Activity Tolerance Patient tolerated treatment well    Behavior During Therapy [facility] for tasks assessed/performed           Past Medical History:  Diagnosis Date  . AAA (abdominal aortic aneurysm) ([place]) 05/2019   Relativley Rapid progression from June-Aug 2020 (4.6 - 5.4 cm): s/p EVAR (Dr. Lucky Cowboy [facility]): EVAR - 23 mm prox, 12 cm distal & x 18 cm length Gore Excluder Endoprosthesis Main body (via RFA distal to lowest Renal A); 40m x 14 cm L Contralateral Limb for L Iliac - extended with 8 mm x 6cm LifeStar stent (post-dilated with 7 mm DEB).  Additional R Iliac PTA not required.   . 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 ([place] 09/15/2017   09-15-2017 DIAGNOSIS:  A. COLON POLYP X 2, ASCENDING; COLD SNARE:  - TUBULAR ADENOMA.  - NEGATIVE FOR HIGH-GRADE DYSPLASIA AND MALIGNANCY.  - NO ADDITIONAL TISSUE IDENTIFIED; FECAL MATERIAL PRESENT.   B. COLON POLYP X 2, DESCENDING; COLD SNARE:  - ADENOCARCINOMA ARISING IN A DYSPLASTIC SESSILE SERRATED ADENOMA, WITH  LYMPHOVASCULAR INVASION, SEE COMMENT.  - TUBULAR ADENOMA, 2 FRAGMENTS, NEGATIVE FO  . Arthritis   . Basal cell carcinoma of eyelid 01/03/2013   2019 - R side of Nose  . Benign neoplasm of ascending colon   . Benign neoplasm of descending colon   . Bilateral iliac artery stenosis ([place] 2007   Bilateral Iliac A stenting; extension of EVAR  limbs into both Iliacs -- additional stent placed in L Iliac.  . Cancer ([place]    Skin cancer  . Colon polyps   . Complication of anesthesia    severe confusion agitation requiring hospital admission x 4 days  . Compression fracture of fourth lumbar vertebra ([place] 04/2019   - s/p Kyphoplasty; T12, L3-5 ([facility]  . COPD (chronic obstructive pulmonary disease) (HCC)    Centrilobular Emphysema  . Coronary artery disease involving native coronary artery without angina pectoris 1998   - s/p CABG, occluded SVG-D1; patent LIMA-LAD, SVG-OM, SVG- RCA  . Dementia ([place]    memory loss - started noting late 2019  . Falls frequently    broke foot  . Fracture 2021   left foot  . GERD (gastroesophageal reflux disease)   . Hemorrhoid 02/10/2015  . History of hiatal hernia   . History of kidney stones   . HOH (hard of hearing)   . Hypertension   . Lewy body dementia ([place] 04/04/2020  . Macular degeneration disease   . Osteoporosis   . PAF (paroxysmal atrial fibrillation) ([place]    on Eliquis for OAC - Rate Control   . [facility]   . Prostate disorder   . Sciatic pain    Chronic  . Sleep apnea    cpap  .  Temporary cerebral vascular dysfunction 02/10/2015   Had negative work-up.  July, 2012.  No medication changes, had forgot them that day, got dehydrated.   . Urinary incontinence     Past Surgical History:  Procedure Laterality Date  . ABDOMINAL AORTIC ENDOVASCULAR STENT GRAFT Bilateral 05/18/2019   Procedure: ABDOMINAL AORTIC ENDOVASCULAR STENT GRAFT;  Surgeon: Algernon Huxley, MD;  Location: [facility] ORS;  Service: Vascular;  Laterality: Bilateral;  . ANKLE SURGERY Left    ORIF   . Arch aortogram and carotid aortogram  06/02/2006   Dr Oneida Alar did surgery  . BASAL CELL CARCINOMA EXCISION  04/2016   Dermatology  . CARDIAC CATHETERIZATION  01/30/1998    (Dr. Linard Millers): Native LAD & mRCA CTO. 90% D1. Mod Cx. SVG-D1 CTO. SVG-RCA & SVG-OM along with LIMA-LAD patent;  LV NORMAL Fxn  .  CAROTID ENDARTERECTOMY Left Oct. 29, 2007   Dr. Oneida Alar:  . CATARACT EXTRACTION W/PHACO Right 03/05/2017   Procedure: CATARACT EXTRACTION PHACO AND INTRAOCULAR LENS PLACEMENT (IOC);  Surgeon: Leandrew Koyanagi, MD;  Location: [facility] ORS;  Service: Ophthalmology;  Laterality: Right;  [place] 00:58.5 AP% 10.6 CDE 6.20 Fluid Pack lot # [id number]  . CATARACT EXTRACTION W/PHACO Left 04/15/2017   Procedure: CATARACT EXTRACTION PHACO AND INTRAOCULAR LENS PLACEMENT ([place])  Left;  Surgeon: Leandrew Koyanagi, MD;  Location: [place];  Service: Ophthalmology;  Laterality: Left;  . COLONOSCOPY WITH PROPOFOL N/A 09/15/2017   Procedure: COLONOSCOPY WITH PROPOFOL;  Surgeon: Lucilla Lame, MD;  Location: [facility] ENDOSCOPY;  Service: Endoscopy;  Laterality: N/A;  . COLONOSCOPY WITH PROPOFOL N/A 10/26/2018   Procedure: COLONOSCOPY WITH PROPOFOL;  Surgeon: Lucilla Lame, MD;  Location: [facility] ENDOSCOPY;  Service: Endoscopy;  Laterality: N/A;  . CORONARY ARTERY BYPASS GRAFT  1998   LIMA-LAD, SVG-OM, SVG-RPDA, SVG-DIAG  . CPET / MET  12/2012   Mild chronotropic incompetence - read 82% of predicted; also reduced effort; peak VO2 15.7 / 75% (did not reach Max effort) -- suggested ischemic response in last 1.5 minutes of exercise. Normal pulmonary function on PFTs but poor response to  . CYSTOSCOPY W/ RETROGRADES Left 01/13/2020   Procedure: CYSTOSCOPY WITH RETROGRADE PYELOGRAM;  Surgeon: Billey Co, MD;  Location: [facility] ORS;  Service: Urology;  Laterality: Left;  . CYSTOSCOPY WITH STENT PLACEMENT Left 12/26/2019   Procedure: CYSTOSCOPY WITH STENT PLACEMENT;  Surgeon: Billey Co, MD;  Location: [facility] ORS;  Service: Urology;  Laterality: Left;  . CYSTOSCOPY/URETEROSCOPY/HOLMIUM LASER/STENT PLACEMENT Left 01/13/2020   Procedure: CYSTOSCOPY/URETEROSCOPY/HOLMIUM LASER/STENT EXCHANGE;  Surgeon: Billey Co, MD;  Location: [facility] ORS;  Service: Urology;  Laterality: Left;  . ENDOVASCULAR REPAIR/STENT GRAFT N/A  05/18/2019   Procedure: ENDOVASCULAR REPAIR/STENT GRAFT;  Surgeon: Algernon Huxley, MD;  Location: [facility] INVASIVE CV LAB;; EVAR - 23 mm prox, 12 cm distal & x 18 cm length Gore Excluder Endoprosthesis Main body (via RFA distal to lowest Renal A); 6m x 14 cm L Contralateral Limb for L Iliac - extended with 8 mm x 6cm LifeStar stent; no additional R Iliac PTA needed.   . ESOPHAGOGASTRODUODENOSCOPY (EGD) WITH PROPOFOL N/A 09/15/2017   Procedure: ESOPHAGOGASTRODUODENOSCOPY (EGD) WITH PROPOFOL;  Surgeon: WLucilla Lame MD;  [facility];  Service: Endoscopy;  Laterality: N/A;  . FRACTURE SURGERY Right 03/19/2015   wrist  . FRACTURE SURGERY Left   . HIATAL HERNIA REPAIR    . ILIAC ARTERY STENT  12/15/2005   PTA and direct stenting rgt and lft common iliac arteries  . KYPHOPLASTY N/A 04/05/2019  Procedure: KYPHOPLASTY T12, L3, L4 L5;  Surgeon: Hessie Knows, MD;  Location: [facility] ORS;  Service: Orthopedics;  Laterality: N/A;  . NM GATED MYOVIEW ([place] HX)  05/'19; 8/'20   a) [facility]: Low Risk - no ischemia or infarct.  EF > 65%. no RWMA;; b) [facility]): Normal wall motion.  No ischemia or infarction.  . OPEN REDUCTION INTERNAL FIXATION (ORIF) DISTAL RADIAL FRACTURE Left 01/13/2019   Procedure: OPEN REDUCTION INTERNAL FIXATION (ORIF) DISTAL  RADIAL FRACTURE - LEFT - SLEEP APNEA;  Surgeon: Hessie Knows, MD;  Location: [facility] ORS;  Service: Orthopedics;  Laterality: Left;  . ORIF ELBOW FRACTURE Right 01/01/2017   Procedure: OPEN REDUCTION INTERNAL FIXATION (ORIF) ELBOW/OLECRANON FRACTURE;  Surgeon: Hessie Knows, MD;  Location: [facility] ORS;  Service: Orthopedics;  Laterality: Right;  . ORIF WRIST FRACTURE Right 03/19/2015   Procedure: OPEN REDUCTION INTERNAL FIXATION (ORIF) WRIST FRACTURE;  Surgeon: Hessie Knows, MD;  Location: [facility] ORS;  Service: Orthopedics;  Laterality: Right;  . SHOULDER ARTHROSCOPY Right   . THORACIC AORTA - CAROTID ANGIOGRAM  October 2007   Dr. 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 TRANSTHORACIC ECHOCARDIOGRAM  February 2016   [facility]: Normal LV function. Dilated left atrium.  Domingo Dimes ECHOCARDIOGRAM  04/2019   [facility] April 20, 2019): EF 55-60 %.  Mild LVH.  Relatively normal valves.    There were no vitals filed for this visit.   Subjective Assessment - 11/08/20 0915    Subjective Patient presents wtih daughter. Has been having difficulty settling into memory care unit. Had doctor appointment yesterday.    Patient is accompained by: --   Arsenio Katz   Pertinent History Patient is returning to PT for evaluation of balance. Patient has been seen by this therapist in the past. PMH AAA, arthritis, basal cell carcinoma of eyelid, benign neoplasm of ascending/descending colon, bilateral iliac artery stenosis, colon polyps, compression fx of 4th lumbar vertebra (s/p kyphoplasty T12, L3-5), COPD, CAD, dementia, falls,  GERD, hemorroids, HOH, HTN, Lewy Body Dementia, macular degeneration disease, osteoporosis, PAF, prostate cancer, prostate disorder, sciatic pain, sleep apnea, temporary cerebral vascular dysfunction. Comes with aide, having multiple near falls per day per aide, mainly to the sides.    Limitations Walking;Standing;House hold activities;Other (comment);Lifting;Sitting    How long can you sit comfortably? n/a back pain no longer limiting    How long can you stand comfortably? 10-12 with walker    How long can you walk comfortably? walks 15 minutes with a walker    Diagnostic tests MRI from [place] dated 03/28/2019 report was reviewed today. Acute compression fractures are noted at T12, L3, and L4. He has facet arthropathy at the lower lumbar levels. At L3-4 he has moderate central stenosis.    Patient Stated Goals walk without stumbling, be able to be steady enough to not have to have someone holding his gait belt.    Currently in Pain?  No/denies                    vitals at start of session seated: 103/68 standign : 105/57            In // bars:  4lb ankle weight: -high knee march 4x length of // bars; visual cueing required; cues for task orientation -side stepping 4x length of // bars, cues for neutral hip alignment for optimal lateral muscle recruitment.  -hip extension 10x each  LE   6" step toe taps 10x each LE, BUE support  Standing saebo ball transfer for coordination, UE reach, and stabilization x 6 minutes  Orange hurdle; step over and back 10x, BUE support, challenging with spatial awareness of RLE     10x STS; BUE support   Seated:   soccer ball kicks 2 minutes for coordination, sequencing, and spatial awareness.  Seated balloon taps for righting reactions, coordination, and spatial awareness x 4 minutes.  GTB adduction 15x each LE  GTB hamstring curl 15x each LE against PT resistance GTB abduction 15x with cues for sequencing GTB marching 15x each LE  Seated green ball adduction squeeze 15x 3 second holds   Pt educated throughout session about proper posture and technique with exercises. Improved exercise technique, movement at target joints, use of target muscles after min to mod verbal, visual, tactile cues   Patient is very fatigued and confused today limiting session duration. Fell asleep throughout session requiring cueing for waking up.  He is able to perform task with frequent task orientation cues.  Patient will continue to benefit from skilled physical therapy to improve generalized strength, ROM, and capacity for functional activity.                       PT Education - 11/08/20 0915    Education provided Yes    Education Details exercise technique, body mechanics    Person(s) Educated Patient    Methods Explanation;Demonstration;Tactile cues;Verbal cues    Comprehension Verbalized understanding;Returned demonstration;Verbal cues required;Tactile cues  required            PT Short Term Goals - 08/21/20 0959      PT SHORT TERM GOAL #1   Title Patient will be independent in home exercise program to improve strength/mobility for better functional independence with ADLs.    Baseline 11/11: HEP given 12/21: HEP compliant    Time 4    Period Weeks    Status Achieved    Target Date 08/09/20             PT Long Term Goals - 10/04/20 1235      PT LONG TERM GOAL #1   Title Patient will increase FOTO score to equal to or greater than 51% to demonstrate statistically significant improvement in mobility and quality of life.    Baseline 11/11: 42% 12/21: 46.8% 2/3: 36%    Time 12    Period Weeks    Status Partially Met    Target Date 12/27/20      PT LONG TERM GOAL #2   Title Patient will demonstrate an improved Berg Balance Score of > 40/56 as to demonstrate improved balance with ADLs such as sitting/standing and transfer balance and reduced fall risk.    Baseline 11/11: 34/56 12/21: 37/56 2/3: 35/56    Time 12    Period Weeks    Status On-going    Target Date 12/27/20      PT LONG TERM GOAL #3   Title Patient (> 104 years old) will complete five times sit to stand test in < 15 seconds without UE support indicating an increased LE strength and improved balance    Baseline 11/11; 23.35 seconds with UE support 12/21: 27.85 with one near LOB 2/3: 12.6 seconds with heavy BUE support due to confusion    Time 12    Period Weeks    Status On-going    Target Date 12/27/20  PT LONG TERM GOAL #4   Title Patient will increase 10 meter walk test to >1.33ms with LRAD  as to improve gait speed for better community ambulation and to reduce fall risk.    Baseline 11/11: 0.74 m/s without AD with one near LOB 12/21: 1.08 m/s    Time 12    Period Weeks    Status Achieved      PT LONG TERM GOAL #5   Title Patient will increase BLE gross strength to 4+/5 as to improve functional strength for independent gait, increased standing tolerance  and increased ADL ability.    Baseline 11/11: see note 2/3: strength testing limited due to patient confusion    Time 12    Period Weeks    Status Partially Met    Target Date 12/27/20      PT LONG TERM GOAL #6   Title Patient will increase six minute walk test distance to >1000 for progression to community ambulator and improve gait ability    Baseline 2/3: able to perform 4 minutes for 420 ft with rollator    Time 12    Period Weeks    Status New    Target Date 12/27/20                 Plan - 11/08/20 1006    Clinical Impression Statement Patient is very fatigued and confused today limiting session duration. Fell asleep throughout session requiring cueing for waking up.  He is able to perform task with frequent task orientation cues.  Patient will continue to benefit from skilled physical therapy to improve generalized strength, ROM, and capacity for functional activity.    Personal Factors and Comorbidities Age;Comorbidity 3+;Fitness;Past/Current Experience;Sex;Social Background;Time since onset of injury/illness/exacerbation;Transportation    Comorbidities AAA, arthritis, atherosclerotic heart disease, (s/p CABG), benign neoplasm of ascending/descending colon, COPD, CAD, dementia, dysrhythmia, frequent falls, GERD, hemorrhoid, HOH, HTN, PAF, prostate cancer.    Examination-Activity Limitations Bathing;Bend;Carry;Dressing;Continence;Stairs;Squat;Reach Overhead;Locomotion Level;Lift;Stand;Transfers;Toileting;Bed Mobility    Examination-Participation Restrictions Church;Cleaning;Community Activity;Driving;Interpersonal Relationship;Laundry;Volunteer;Shop;Personal Finances;Meal Prep;Yard Work    SProduct manager   Rehab Potential Fair    Clinical Impairments Affecting Rehab Potential (+) good support system, high prior level of function (-) currently undergoing radiation therapy for prostate cancer, memory deficits     PT Frequency 2x /  week    PT Duration 12 weeks    PT Treatment/Interventions ADLs/Self Care Home Management;Aquatic Therapy;Cryotherapy;Moist Heat;Traction;Ultrasound;Therapeutic activities;Functional mobility training;Stair training;Gait training;DME Instruction;Therapeutic exercise;Balance training;Neuromuscular re-education;Patient/family education;Manual techniques;Passive range of motion;Energy conservation;Vestibular;Taping;Biofeedback;Electrical Stimulation;Iontophoresis 458mml Dexamethasone;Dry needling;Visual/perceptual remediation/compensation;Cognitive remediation;Canalith Repostioning    PT Next Visit Plan balance, strength    PT Home Exercise Plan see above    Consulted and Agree with Plan of Care Patient;Family member/caregiver    Family Member Consulted aide           Patient will benefit from skilled therapeutic intervention in order to improve the following deficits and impairments:  Abnormal gait,Cardiopulmonary status limiting activity,Decreased activity tolerance,Decreased balance,Decreased knowledge of precautions,Decreased endurance,Decreased coordination,Decreased cognition,Decreased knowledge of use of DME,Decreased mobility,Difficulty walking,Decreased safety awareness,Decreased strength,Impaired flexibility,Impaired perceived functional ability,Impaired sensation,Postural dysfunction,Improper body mechanics,Pain,Impaired vision/preception,Decreased range of motion  Visit Diagnosis: Muscle weakness (generalized)  Unsteadiness on feet  Other abnormalities of gait and mobility     Problem List Patient Active Problem List   Diagnosis Date Noted  . AAA (abdominal aortic aneurysm) without rupture (H[place]10/29/2021  . LBD (Lewy body dementia) (HC[place]obl[place]09/13/2021  . Numbness and tingling in left hand 03/02/2020  .  Hematuria   . Acute kidney injury superimposed on CKD ([place])   . Sepsis due to gram-negative UTI ([place]) 12/26/2019  . Left nephrolithiasis 12/26/2019  . Hydronephrosis, left  12/26/2019  . Status post abdominal aortic aneurysm (AAA) repair 04/15/2019  . Do not intubate, cardiopulmonary resuscitation (CPR)-only code status 10/08/2018  . Prostate cancer ([place]) 02/25/2018  . Prostatic cyst 01/15/2018  . Blood in stool   . Abnormal feces   . Stricture and stenosis of esophagus   . Mild cognitive impairment 08/18/2017  . Senile purpura ([place]) 04/29/2017  . Anemia 04/29/2017  . Frequent falls 04/29/2017  . Simple chronic bronchitis ([place]) 12/17/2016  . Benign localized hyperplasia of prostate with urinary obstruction 07/15/2016  . Elevated PSA 07/15/2016  . Incomplete emptying of bladder 07/15/2016  . Urge incontinence 07/15/2016  . Hypothyroidism 04/25/2016  . Macular degeneration 04/25/2016  . Erectile dysfunction due to arterial insufficiency   . Ischemic heart disease with chronotropic incompetence   . CN (constipation) 02/10/2015  . DD (diverticular disease) 02/10/2015  . Weakness 02/10/2015  . Lichen planus 77/93/9030  . Restless leg 02/10/2015  . 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 B12 deficiency 02/10/2015  . COPD (chronic obstructive pulmonary disease) ([place]) 11/14/2014  . Post Inflammatory Lung Changes 11/14/2014  . PAF (paroxysmal atrial fibrillation) (HCC) CHA2DS2-VASc = 4. AC = Eliquis   . Insomnia 12/09/2013  . OSA on CPAP   . Dyspnea on exertion - -essentially resolved with BB dose reduction & wgt loss 03/29/2013  . Overweight (BMI 25.0-29.9) -- Wgt back up 01/17/2013  . Hx of CABG   . Essential hypertension   . Hyperlipidemia with target LDL less than 70   . Aorto-iliac disease ([place])   . BCC (basal cell carcinoma), eyelid 01/03/2013  . Allergic rhinitis 08/17/2009  . Adaptation reaction 05/28/2009  . Acid reflux 05/28/2009  . Arthritis, degenerative 05/28/2009  . 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 Carotid artery occlusion without infarction 06/01/2006  . PAD (peripheral artery disease) - bilateral common iliac stents  12/15/2005  . Atherosclerotic heart disease of artery bypass graft 09/01/1997   Janna Arch, PT, DPT   11/08/2020, 10:14 AM  [place] MAIN [facility] SERVICES [address] [place], [place], [address] Phone: [phone]   Fax:  [phone]  Name: [name] MRN: [id number] Date of Birth: 1934-06-08

## 2020-11-12 ENCOUNTER — Ambulatory Visit: Payer: TRICARE For Life (TFL)

## 2020-11-13 ENCOUNTER — Ambulatory Visit: Payer: Medicare Other

## 2020-11-13 ENCOUNTER — Other Ambulatory Visit: Payer: Self-pay

## 2020-11-13 DIAGNOSIS — R262 Difficulty in walking, not elsewhere classified: Secondary | ICD-10-CM

## 2020-11-13 DIAGNOSIS — G8929 Other chronic pain: Secondary | ICD-10-CM

## 2020-11-13 DIAGNOSIS — M6281 Muscle weakness (generalized): Secondary | ICD-10-CM | POA: Diagnosis not present

## 2020-11-13 DIAGNOSIS — M545 Low back pain, unspecified: Secondary | ICD-10-CM

## 2020-11-13 DIAGNOSIS — R2689 Other abnormalities of gait and mobility: Secondary | ICD-10-CM

## 2020-11-13 DIAGNOSIS — R2681 Unsteadiness on feet: Secondary | ICD-10-CM

## 2020-11-13 NOTE — Therapy (Signed)
[place] MAIN [facility] SERVICES [address] [place], [place], [address] Phone: [phone]   Fax:  [phone]  Physical Therapy Treatment Physical Therapy Progress Note   Dates of reporting period  10/04/20   to   11/13/20   Patient Details  Name: [name] MRN: [id number] Date of Birth: 05-28-1934 Referring Provider (PT): Jennings Books K    Encounter Date: 11/13/2020   PT End of Session - 11/13/20 0944    Visit Number 30    Number of Visits 44    Date for PT Re-Evaluation 12/27/20    PT Start Time 0935    PT Stop Time 1013    PT Time Calculation (min) 38 min    Equipment Utilized During Treatment Gait belt    Activity Tolerance Patient tolerated treatment well;No increased pain    Behavior During Therapy WFL for tasks assessed/performed           Past Medical History:  Diagnosis Date  . AAA (abdominal aortic aneurysm) ([place]) 05/2019   Relativley Rapid progression from June-Aug 2020 (4.6 - 5.4 cm): s/p EVAR (Dr. Lucky Cowboy [facility]): EVAR - 23 mm prox, 12 cm distal & x 18 cm length Gore Excluder Endoprosthesis Main body (via RFA distal to lowest Renal A); 62m x 14 cm L Contralateral Limb for L Iliac - extended with 8 mm x 6cm LifeStar stent (post-dilated with 7 mm DEB).  Additional R Iliac PTA not required.   . 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 ([place] 09/15/2017   09-15-2017 DIAGNOSIS:  A. COLON POLYP X 2, ASCENDING; COLD SNARE:  - TUBULAR ADENOMA.  - NEGATIVE FOR HIGH-GRADE DYSPLASIA AND MALIGNANCY.  - NO ADDITIONAL TISSUE IDENTIFIED; FECAL MATERIAL PRESENT.   B. COLON POLYP X 2, DESCENDING; COLD SNARE:  - ADENOCARCINOMA ARISING IN A DYSPLASTIC SESSILE SERRATED ADENOMA, WITH  LYMPHOVASCULAR INVASION, SEE COMMENT.  - TUBULAR ADENOMA, 2 FRAGMENTS, NEGATIVE FO  . Arthritis   . Basal cell carcinoma of eyelid 01/03/2013   2019 - R side of Nose  . Benign neoplasm of ascending colon   . Benign neoplasm of descending colon   . Bilateral iliac artery  stenosis ([place] 2007   Bilateral Iliac A stenting; extension of EVAR limbs into both Iliacs -- additional stent placed in L Iliac.  . Cancer ([place]    Skin cancer  . Colon polyps   . Complication of anesthesia    severe confusion agitation requiring hospital admission x 4 days  . Compression fracture of fourth lumbar vertebra ([place] 04/2019   - s/p Kyphoplasty; T12, L3-5 ([facility]  . COPD (chronic obstructive pulmonary disease) (HCC)    Centrilobular Emphysema  . Coronary artery disease involving native coronary artery without angina pectoris 1998   - s/p CABG, occluded SVG-D1; patent LIMA-LAD, SVG-OM, SVG- RCA  . Dementia ([place]    memory loss - started noting late 2019  . Falls frequently    broke foot  . Fracture 2021   left foot  . GERD (gastroesophageal reflux disease)   . Hemorrhoid 02/10/2015  . History of hiatal hernia   . History of kidney stones   . HOH (hard of hearing)   . Hypertension   . Lewy body dementia ([place] 04/04/2020  . Macular degeneration disease   . Osteoporosis   . PAF (paroxysmal atrial fibrillation) ([place]    on Eliquis for OAC - Rate Control   . Prostate cancer ([place]   . Prostate disorder   . Sciatic pain  Chronic  . Sleep apnea    cpap  . Temporary cerebral vascular dysfunction 02/10/2015   Had negative work-up.  July, 2012.  No medication changes, had forgot them that day, got dehydrated.   . Urinary incontinence     Past Surgical History:  Procedure Laterality Date  . ABDOMINAL AORTIC ENDOVASCULAR STENT GRAFT Bilateral 05/18/2019   Procedure: ABDOMINAL AORTIC ENDOVASCULAR STENT GRAFT;  Surgeon: Algernon Huxley, MD;  Location: [facility] ORS;  Service: Vascular;  Laterality: Bilateral;  . ANKLE SURGERY Left    ORIF   . Arch aortogram and carotid aortogram  06/02/2006   Dr Oneida Alar did surgery  . BASAL CELL CARCINOMA EXCISION  04/2016   Dermatology  . CARDIAC CATHETERIZATION  01/30/1998    (Dr. Linard Millers): Native LAD & mRCA CTO. 90% D1. Mod Cx. SVG-D1 CTO.  SVG-RCA & SVG-OM along with LIMA-LAD patent;  LV NORMAL Fxn  . CAROTID ENDARTERECTOMY Left Oct. 29, 2007   Dr. Oneida Alar:  . CATARACT EXTRACTION W/PHACO Right 03/05/2017   Procedure: CATARACT EXTRACTION PHACO AND INTRAOCULAR LENS PLACEMENT (IOC);  Surgeon: Leandrew Koyanagi, MD;  Location: [facility] ORS;  Service: Ophthalmology;  Laterality: Right;  [place] 00:58.5 AP% 10.6 CDE 6.20 Fluid Pack lot # [id number]  . CATARACT EXTRACTION W/PHACO Left 04/15/2017   Procedure: CATARACT EXTRACTION PHACO AND INTRAOCULAR LENS PLACEMENT ([place])  Left;  Surgeon: Leandrew Koyanagi, MD;  Location: [place];  Service: Ophthalmology;  Laterality: Left;  . COLONOSCOPY WITH PROPOFOL N/A 09/15/2017   Procedure: COLONOSCOPY WITH PROPOFOL;  Surgeon: Lucilla Lame, MD;  Location: [facility] ENDOSCOPY;  Service: Endoscopy;  Laterality: N/A;  . COLONOSCOPY WITH PROPOFOL N/A 10/26/2018   Procedure: COLONOSCOPY WITH PROPOFOL;  Surgeon: Lucilla Lame, MD;  Location: [facility] ENDOSCOPY;  Service: Endoscopy;  Laterality: N/A;  . CORONARY ARTERY BYPASS GRAFT  1998   LIMA-LAD, SVG-OM, SVG-RPDA, SVG-DIAG  . CPET / MET  12/2012   Mild chronotropic incompetence - read 82% of predicted; also reduced effort; peak VO2 15.7 / 75% (did not reach Max effort) -- suggested ischemic response in last 1.5 minutes of exercise. Normal pulmonary function on PFTs but poor response to  . CYSTOSCOPY W/ RETROGRADES Left 01/13/2020   Procedure: CYSTOSCOPY WITH RETROGRADE PYELOGRAM;  Surgeon: Billey Co, MD;  Location: [facility] ORS;  Service: Urology;  Laterality: Left;  . CYSTOSCOPY WITH STENT PLACEMENT Left 12/26/2019   Procedure: CYSTOSCOPY WITH STENT PLACEMENT;  Surgeon: Billey Co, MD;  Location: [facility] ORS;  Service: Urology;  Laterality: Left;  . CYSTOSCOPY/URETEROSCOPY/HOLMIUM LASER/STENT PLACEMENT Left 01/13/2020   Procedure: CYSTOSCOPY/URETEROSCOPY/HOLMIUM LASER/STENT EXCHANGE;  Surgeon: Billey Co, MD;  Location: [facility] ORS;  Service: Urology;   Laterality: Left;  . ENDOVASCULAR REPAIR/STENT GRAFT N/A 05/18/2019   Procedure: ENDOVASCULAR REPAIR/STENT GRAFT;  Surgeon: Algernon Huxley, MD;  Location: [facility] INVASIVE CV LAB;; EVAR - 23 mm prox, 12 cm distal & x 18 cm length Gore Excluder Endoprosthesis Main body (via RFA distal to lowest Renal A); 27m x 14 cm L Contralateral Limb for L Iliac - extended with 8 mm x 6cm LifeStar stent; no additional R Iliac PTA needed.   . ESOPHAGOGASTRODUODENOSCOPY (EGD) WITH PROPOFOL N/A 09/15/2017   Procedure: ESOPHAGOGASTRODUODENOSCOPY (EGD) WITH PROPOFOL;  Surgeon: WLucilla Lame MD;  [facility];  Service: Endoscopy;  Laterality: N/A;  . FRACTURE SURGERY Right 03/19/2015   wrist  . FRACTURE SURGERY Left   . HIATAL HERNIA REPAIR    . ILIAC ARTERY STENT  12/15/2005   PTA and direct stenting rgt  and lft common iliac arteries  . KYPHOPLASTY N/A 04/05/2019   Procedure: KYPHOPLASTY T12, L3, L4 L5;  Surgeon: Hessie Knows, MD;  Location: [facility] ORS;  Service: Orthopedics;  Laterality: N/A;  . NM GATED MYOVIEW ([place] HX)  05/'19; 8/'20   a) [facility]: Low Risk - no ischemia or infarct.  EF > 65%. no RWMA;; b) [facility]): Normal wall motion.  No ischemia or infarction.  . OPEN REDUCTION INTERNAL FIXATION (ORIF) DISTAL RADIAL FRACTURE Left 01/13/2019   Procedure: OPEN REDUCTION INTERNAL FIXATION (ORIF) DISTAL  RADIAL FRACTURE - LEFT - SLEEP APNEA;  Surgeon: Hessie Knows, MD;  Location: [facility] ORS;  Service: Orthopedics;  Laterality: Left;  . ORIF ELBOW FRACTURE Right 01/01/2017   Procedure: OPEN REDUCTION INTERNAL FIXATION (ORIF) ELBOW/OLECRANON FRACTURE;  Surgeon: Hessie Knows, MD;  Location: [facility] ORS;  Service: Orthopedics;  Laterality: Right;  . ORIF WRIST FRACTURE Right 03/19/2015   Procedure: OPEN REDUCTION INTERNAL FIXATION (ORIF) WRIST FRACTURE;  Surgeon: Hessie Knows, MD;  Location: [facility] ORS;  Service: Orthopedics;  Laterality: Right;  . SHOULDER ARTHROSCOPY Right   . THORACIC AORTA - CAROTID ANGIOGRAM   October 2007   Dr. 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 TRANSTHORACIC ECHOCARDIOGRAM  February 2016   [facility]: Normal LV function. Dilated left atrium.  Domingo Dimes ECHOCARDIOGRAM  04/2019   [facility] April 20, 2019): EF 55-60 %.  Mild LVH.  Relatively normal valves.    There were no vitals filed for this visit.   Subjective Assessment - 11/13/20 0941    Subjective Pt doing well today, still slowly aclimating to new memory care setting at [facility]. A little soreness in theleft leg today.    Patient is accompained by: Family member   DTR   Pertinent History Patient is returning to PT for evaluation of balance. Patient has been seen by this therapist in the past. PMH AAA, arthritis, basal cell carcinoma of eyelid, benign neoplasm of ascending/descending colon, bilateral iliac artery stenosis, colon polyps, compression fx of 4th lumbar vertebra (s/p kyphoplasty T12, L3-5), COPD, CAD, dementia, falls,  GERD, hemorroids, HOH, HTN, Lewy Body Dementia, macular degeneration disease, osteoporosis, PAF, prostate cancer, prostate disorder, sciatic pain, sleep apnea, temporary cerebral vascular dysfunction. Comes with aide, having multiple near falls per day per aide, mainly to the sides.    Limitations Walking;Standing;House hold activities;Other (comment);Lifting;Sitting    Currently in Pain? Yes   Left leg tightness, abates with Nustep             [facility] PT Assessment - 11/13/20 0001      ROM / Strength   AROM / PROM / Strength Strength      Strength   Strength Assessment Site Hip;Knee;Ankle    Right/Left Hip Right;Left    Right Hip Flexion 5/5    Right Hip External Rotation  5/5    Right Hip Internal Rotation 4+/5    Right Hip ABduction --   horoizontal ABDCT: 5/5   Right Hip ADduction --   horizontal adduction 5/5   Left Hip Flexion 5/5    Left Hip External Rotation 5/5    Left Hip  Internal Rotation 5/5    Left Hip ABduction --   horoizontal ABDCT: 5/5   Left Hip ADduction --   horizontal adduction 5/5   Right/Left Knee Right;Left    Right Knee Flexion 5/5    Right Knee Extension 5/5    Left Knee Flexion  5/5    Left Knee Extension 5/5    Right/Left Ankle Right;Left    Right Ankle Dorsiflexion 5/5    Right Ankle Plantar Flexion 2/5   strong manual test, but unable in SLS, and 1 rep of minimal lift off in double leg stnace   Left Ankle Dorsiflexion 5/5    Left Ankle Plantar Flexion 2/5   strong manual test, but unable in SLS, and 1 rep of minimal lift off in double leg stnace     Transfers   Five time sit to stand comments  12.79sec   BUE use on chair arms     Ambulation/Gait   Gait Comments 10MWT:   9.09sec (pt defaults to fastest speed without cue), rollator          Seated ankle plantarflexion in closed chain, forefoot on half foam roll to begin in ankle DF -2x10 c 10lb AW (too heavy, limits full available ROM)  -1x10 un resisted, improved demonstration of available ROM   -retroAMB 4x64f, minGuardA to modA for falls recovery. Per formance worsens with time in standing.  -lateral side stepping 2x175fbilat, minGuard assist  -180 degrees rotational turns 5x bilat, c LOB >50% of time.     PT Short Term Goals - 08/21/20 0959      PT SHORT TERM GOAL #1   Title Patient will be independent in home exercise program to improve strength/mobility for better functional independence with ADLs.    Baseline 11/11: HEP given 12/21: HEP compliant    Time 4    Period Weeks    Status Achieved    Target Date 08/09/20             PT Long Term Goals - 11/13/20 0946      PT LONG TERM GOAL #1   Title Patient will increase FOTO score to equal to or greater than 51% to demonstrate statistically significant improvement in mobility and quality of life.    Baseline 11/11: 42% 12/21: 46.8% 2/3: 36%    Time 12    Period Weeks    Status Partially Met    Target Date  12/27/20      PT LONG TERM GOAL #2   Title Patient will demonstrate an improved Berg Balance Score of > 40/56 as to demonstrate improved balance with ADLs such as sitting/standing and transfer balance and reduced fall risk.    Baseline 11/11: 34/56 12/21: 37/56 2/3: 35/56    Time 12    Period Weeks    Status On-going    Target Date 12/27/20      PT LONG TERM GOAL #3   Title Patient (> 6075ears old) will complete five times sit to stand test in < 15 seconds without UE support indicating an increased LE strength and improved balance    Baseline 11/11; 23.35 seconds with UE support 12/21: 27.85 with one near LOB 2/3: 12.6 seconds with heavy BUE support due to confusion; 11/13/20: 12.79sec c BUE use    Time 12    Period Weeks    Status Achieved    Target Date 12/27/20      PT LONG TERM GOAL #4   Title Patient will increase 10 meter walk test to >1.24m98mwith LRAD  as to improve gait speed for better community ambulation and to reduce fall risk.    Baseline 11/11: 0.74 m/s without AD with one near LOB 12/21: 1.08 m/s; 3/15: 1.124m61mfastest), 0.38m/524mormal)    Time 12  Period Weeks    Status Achieved    Target Date 10/04/20      PT LONG TERM GOAL #5   Title Patient will increase BLE gross strength to 4+/5 as to improve functional strength for independent gait, increased standing tolerance and increased ADL ability.    Baseline 11/11: see note 2/3: mentation limiting testing; 3/15: 5/5 hips and knees, 4+ Left hip IR), Ankle PF 2/5 bilat    Time 12    Period Weeks    Status On-going    Target Date 12/27/20      PT LONG TERM GOAL #6   Title Patient will increase six minute walk test distance to >1000 for progression to community ambulator and improve gait ability    Baseline 2/3: able to perform 4 minutes for 420 ft with rollator    Time 12    Period Weeks    Status On-going    Target Date 12/27/20                 Plan - 11/13/20 0945    Clinical Impression Statement  Reassessment this date, pt showing both good progress in recent gains toward goals, as well as maintaining progress from 4 weeks ago. Leg strength is near goal, but calves remain very weak, likely play a minor roll repeeated sagittal plan LOB. LOB in session more heavily complicated by delayed proprioceptive awareness and lack of any stepping righting responses. Started with pt on Nustep as his AMB tolerance does not allow for sustained aerobic activity >a few hundred feet at time. Pt continues to progress toward treatment goal in general, will continue to benefit from skilled intervention to maximize independence and safety with basic ADL performance.    Personal Factors and Comorbidities Age;Comorbidity 3+;Fitness;Past/Current Experience;Sex;Social Background;Time since onset of injury/illness/exacerbation;Transportation    Comorbidities AAA, arthritis, atherosclerotic heart disease, (s/p CABG), benign neoplasm of ascending/descending colon, COPD, CAD, dementia, dysrhythmia, frequent falls, GERD, hemorrhoid, HOH, HTN, PAF, prostate cancer.    Examination-Activity Limitations Bathing;Bend;Carry;Dressing;Continence;Stairs;Squat;Reach Overhead;Locomotion Level;Lift;Stand;Transfers;Toileting;Bed Mobility    Examination-Participation Restrictions Church;Cleaning;Community Activity;Driving;Interpersonal Relationship;Laundry;Volunteer;Shop;Personal Finances;Meal Prep;Yard Work    [employer] Evolving/Moderate complexity    Clinical Decision Making Moderate    Rehab Potential Fair    Clinical Impairments Affecting Rehab Potential (+) good support system, high prior level of function (-) currently undergoing radiation therapy for prostate cancer, memory deficits     PT Frequency 2x / week    PT Duration 12 weeks    PT Treatment/Interventions ADLs/Self Care Home Management;Aquatic Therapy;Cryotherapy;Moist Heat;Traction;Ultrasound;Therapeutic activities;Functional mobility training;Stair  training;Gait training;DME Instruction;Therapeutic exercise;Balance training;Neuromuscular re-education;Patient/family education;Manual techniques;Passive range of motion;Energy conservation;Vestibular;Taping;Biofeedback;Electrical Stimulation;Iontophoresis 41m/ml Dexamethasone;Dry needling;Visual/perceptual remediation/compensation;Cognitive remediation;Canalith Repostioning    PT Next Visit Plan balance, strength    PT Home Exercise Plan see above    Consulted and Agree with Plan of Care Patient;Family member/caregiver    Family Member Consulted Tisha           Patient will benefit from skilled therapeutic intervention in order to improve the following deficits and impairments:  Abnormal gait,Cardiopulmonary status limiting activity,Decreased activity tolerance,Decreased balance,Decreased knowledge of precautions,Decreased endurance,Decreased coordination,Decreased cognition,Decreased knowledge of use of DME,Decreased mobility,Difficulty walking,Decreased safety awareness,Decreased strength,Impaired flexibility,Impaired perceived functional ability,Impaired sensation,Postural dysfunction,Improper body mechanics,Pain,Impaired vision/preception,Decreased range of motion  Visit Diagnosis: Muscle weakness (generalized)  Unsteadiness on feet  Other abnormalities of gait and mobility  Chronic low back pain, unspecified back pain laterality, unspecified whether sciatica present  Difficulty in walking, not elsewhere classified     Problem List Patient  Active Problem List   Diagnosis Date Noted  . AAA (abdominal aortic aneurysm) without rupture ([place]) 06/29/2020  . LBD (Lewy body dementia) ([place]) 05/14/2020  . Numbness and tingling in left hand 03/02/2020  . Hematuria   . Acute kidney injury superimposed on CKD ([place])   . Sepsis due to gram-negative UTI ([place]) 12/26/2019  . Left nephrolithiasis 12/26/2019  . Hydronephrosis, left 12/26/2019  . Status post abdominal aortic aneurysm (AAA)  repair 04/15/2019  . Do not intubate, cardiopulmonary resuscitation (CPR)-only code status 10/08/2018  . Prostate cancer ([place]) 02/25/2018  . Prostatic cyst 01/15/2018  . Blood in stool   . Abnormal feces   . Stricture and stenosis of esophagus   . Mild cognitive impairment 08/18/2017  . Senile purpura ([place]) 04/29/2017  . Anemia 04/29/2017  . Frequent falls 04/29/2017  . Simple chronic bronchitis ([place]) 12/17/2016  . Benign localized hyperplasia of prostate with urinary obstruction 07/15/2016  . Elevated PSA 07/15/2016  . Incomplete emptying of bladder 07/15/2016  . Urge incontinence 07/15/2016  . Hypothyroidism 04/25/2016  . Macular degeneration 04/25/2016  . Erectile dysfunction due to arterial insufficiency   . Ischemic heart disease with chronotropic incompetence   . CN (constipation) 02/10/2015  . DD (diverticular disease) 02/10/2015  . Weakness 02/10/2015  . Lichen planus 72/62/0355  . Restless leg 02/10/2015  . 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 B12 deficiency 02/10/2015  . COPD (chronic obstructive pulmonary disease) ([place]) 11/14/2014  . Post Inflammatory Lung Changes 11/14/2014  . PAF (paroxysmal atrial fibrillation) (HCC) CHA2DS2-VASc = 4. AC = Eliquis   . Insomnia 12/09/2013  . OSA on CPAP   . Dyspnea on exertion - -essentially resolved with BB dose reduction & wgt loss 03/29/2013  . Overweight (BMI 25.0-29.9) -- Wgt back up 01/17/2013  . Hx of CABG   . Essential hypertension   . Hyperlipidemia with target LDL less than 70   . Aorto-iliac disease ([place])   . BCC (basal cell carcinoma), eyelid 01/03/2013  . Allergic rhinitis 08/17/2009  . Adaptation reaction 05/28/2009  . Acid reflux 05/28/2009  . Arthritis, degenerative 05/28/2009  . 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 Carotid artery occlusion without infarction 06/01/2006  . PAD (peripheral artery disease) - bilateral common iliac stents 12/15/2005  . Atherosclerotic heart disease of artery bypass  graft 09/01/1997   12:16 PM, 11/13/20 Etta Grandchild, PT, DPT Physical Therapist - [place] [phone]     Etta Grandchild 11/13/2020, 10:10 AM  [place] MAIN [facility] SERVICES [address] [place], [place], [address] Phone: [phone]   Fax:  [phone]  Name: [name] MRN: [id number] Date of Birth: 18-Jan-1934

## 2020-11-15 ENCOUNTER — Ambulatory Visit: Payer: Medicare Other

## 2020-11-15 ENCOUNTER — Other Ambulatory Visit: Payer: Self-pay

## 2020-11-15 DIAGNOSIS — M545 Low back pain, unspecified: Secondary | ICD-10-CM

## 2020-11-15 DIAGNOSIS — R262 Difficulty in walking, not elsewhere classified: Secondary | ICD-10-CM

## 2020-11-15 DIAGNOSIS — M6281 Muscle weakness (generalized): Secondary | ICD-10-CM

## 2020-11-15 DIAGNOSIS — R2681 Unsteadiness on feet: Secondary | ICD-10-CM

## 2020-11-15 DIAGNOSIS — G8929 Other chronic pain: Secondary | ICD-10-CM

## 2020-11-15 DIAGNOSIS — R2689 Other abnormalities of gait and mobility: Secondary | ICD-10-CM

## 2020-11-15 NOTE — Therapy (Signed)
[place] MAIN [facility] SERVICES [address] [place], [place], [address] Phone: [phone]   Fax:  [phone]  Physical Therapy Treatment  Patient Details  Name: MARSEAN ELKHATIB MRN: [id number] Date of Birth: [birth year] Referring Provider (PT): Jennings Books K    Encounter Date: 11/15/2020   PT End of Session - 11/15/20 0935    Visit Number 31    Number of Visits 44    Date for PT Re-Evaluation 12/27/20    PT Start Time 0925    PT Stop Time 1005    PT Time Calculation (min) 40 min    Equipment Utilized During Treatment Gait belt    Activity Tolerance Patient tolerated treatment well;No increased pain    Behavior During Therapy WFL for tasks assessed/performed           Past Medical History:  Diagnosis Date  . AAA (abdominal aortic aneurysm) ([place]) 05/2019   Relativley Rapid progression from June-Aug 2020 (4.6 - 5.4 cm): s/p EVAR (Dr. Lucky [facility]): EVAR - 23 mm prox, 12 cm distal & x 18 cm length Gore Excluder Endoprosthesis Main body (via RFA distal to lowest Renal A); 59m x 14 cm L Contralateral Limb for L Iliac - extended with 8 mm x 6cm LifeStar stent (post-dilated with 7 mm DEB).  Additional R Iliac PTA not required.   . 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 ([place] 09/15/2017   09-15-2017 DIAGNOSIS:  A. COLON POLYP X 2, ASCENDING; COLD SNARE:  - TUBULAR ADENOMA.  - NEGATIVE FOR HIGH-GRADE DYSPLASIA AND MALIGNANCY.  - NO ADDITIONAL TISSUE IDENTIFIED; FECAL MATERIAL PRESENT.   B. COLON POLYP X 2, DESCENDING; COLD SNARE:  - ADENOCARCINOMA ARISING IN A DYSPLASTIC SESSILE SERRATED ADENOMA, WITH  LYMPHOVASCULAR INVASION, SEE COMMENT.  - TUBULAR ADENOMA, 2 FRAGMENTS, NEGATIVE FO  . Arthritis   . Basal cell carcinoma of eyelid 01/03/2013   2019 - R side of Nose  . Benign neoplasm of ascending colon   . Benign neoplasm of descending colon   . Bilateral iliac artery stenosis ([place] 2007   Bilateral Iliac A stenting; extension of EVAR limbs into both  Iliacs -- additional stent placed in L Iliac.  . Cancer ([place]    Skin cancer  . Colon polyps   . Complication of anesthesia    severe confusion agitation requiring hospital admission x 4 days  . Compression fracture of fourth lumbar vertebra (HH[place] 04/2019   - s/p Kyphoplasty; T12, L3-5 ([facility]  . COPD (chronic obstructive pulmonary disease) (HCC)    Centrilobular Emphysema  . Coronary artery disease involving native coronary artery without angina pectoris 1998   - s/p CABG, occluded SVG-D1; patent LIMA-LAD, SVG-OM, SVG- RCA  . Dementia ([facility]    memory loss - started noting late 2019  . Falls frequently    broke foot  . Fracture 2021   left foot  . GERD (gastroesophageal reflux disease)   . Hemorrhoid 02/10/2015  . History of hiatal hernia   . History of kidney stones   . HOH (hard of hearing)   . Hypertension   . Lewy body dementia ([facility] 04/04/2020  . Macular degeneration disease   . Osteoporosis   . PAF (paroxysmal atrial fibrillation) ([facility]    on Eliquis for OAC - Rate Control   . Prostate cancer (HS[place]   . Prostate disorder   . Sciatic pain    Chronic  . Sleep apnea    cpap  . Temporary cerebral vascular dysfunction 02/10/2015  Had negative work-up.  July, 2012.  No medication changes, had forgot them that day, got dehydrated.   . Urinary incontinence     Past Surgical History:  Procedure Laterality Date  . ABDOMINAL AORTIC ENDOVASCULAR STENT GRAFT Bilateral 05/18/2019   Procedure: ABDOMINAL AORTIC ENDOVASCULAR STENT GRAFT;  Surgeon: Algernon Huxley, MD;  Location: [facility] ORS;  Service: Vascular;  Laterality: Bilateral;  . ANKLE SURGERY Left    ORIF   . Arch aortogram and carotid aortogram  06/02/2006   Dr Oneida Alar did surgery  . BASAL CELL CARCINOMA EXCISION  04/2016   Dermatology  . CARDIAC CATHETERIZATION  01/30/1998    (Dr. Linard Millers): Native LAD & mRCA CTO. 90% D1. Mod Cx. SVG-D1 CTO. SVG-RCA & SVG-OM along with LIMA-LAD patent;  LV NORMAL Fxn  . CAROTID  ENDARTERECTOMY Left Oct. 29, 2007   Dr. Oneida Alar:  . CATARACT EXTRACTION W/PHACO Right 03/05/2017   Procedure: CATARACT EXTRACTION PHACO AND INTRAOCULAR LENS PLACEMENT (IOC);  Surgeon: Leandrew Koyanagi, MD;  Location: [facility] ORS;  Service: Ophthalmology;  Laterality: Right;  [place] 00:58.5 AP% 10.6 CDE 6.20 Fluid Pack lot # [id number]  . CATARACT EXTRACTION W/PHACO Left 04/15/2017   Procedure: CATARACT EXTRACTION PHACO AND INTRAOCULAR LENS PLACEMENT ([place])  Left;  Surgeon: Leandrew Koyanagi, MD;  Location: [place];  Service: Ophthalmology;  Laterality: Left;  . COLONOSCOPY WITH PROPOFOL N/A 09/15/2017   Procedure: COLONOSCOPY WITH PROPOFOL;  Surgeon: Lucilla Lame, MD;  Location: [facility] ENDOSCOPY;  Service: Endoscopy;  Laterality: N/A;  . COLONOSCOPY WITH PROPOFOL N/A 10/26/2018   Procedure: COLONOSCOPY WITH PROPOFOL;  Surgeon: Lucilla Lame, MD;  Location: [facility] ENDOSCOPY;  Service: Endoscopy;  Laterality: N/A;  . CORONARY ARTERY BYPASS GRAFT  1998   LIMA-LAD, SVG-OM, SVG-RPDA, SVG-DIAG  . CPET / MET  12/2012   Mild chronotropic incompetence - read 82% of predicted; also reduced effort; peak VO2 15.7 / 75% (did not reach Max effort) -- suggested ischemic response in last 1.5 minutes of exercise. Normal pulmonary function on PFTs but poor response to  . CYSTOSCOPY W/ RETROGRADES Left 01/13/2020   Procedure: CYSTOSCOPY WITH RETROGRADE PYELOGRAM;  Surgeon: Billey Co, MD;  Location: [facility] ORS;  Service: Urology;  Laterality: Left;  . CYSTOSCOPY WITH STENT PLACEMENT Left 12/26/2019   Procedure: CYSTOSCOPY WITH STENT PLACEMENT;  Surgeon: Billey Co, MD;  Location: [facility] ORS;  Service: Urology;  Laterality: Left;  . CYSTOSCOPY/URETEROSCOPY/HOLMIUM LASER/STENT PLACEMENT Left 01/13/2020   Procedure: CYSTOSCOPY/URETEROSCOPY/HOLMIUM LASER/STENT EXCHANGE;  Surgeon: Billey Co, MD;  Location: [facility] ORS;  Service: Urology;  Laterality: Left;  . ENDOVASCULAR REPAIR/STENT GRAFT N/A 05/18/2019    Procedure: ENDOVASCULAR REPAIR/STENT GRAFT;  Surgeon: Algernon Huxley, MD;  Location: [facility] INVASIVE CV LAB;; EVAR - 23 mm prox, 12 cm distal & x 18 cm length Gore Excluder Endoprosthesis Main body (via RFA distal to lowest Renal A); 79m x 14 cm L Contralateral Limb for L Iliac - extended with 8 mm x 6cm LifeStar stent; no additional R Iliac PTA needed.   . ESOPHAGOGASTRODUODENOSCOPY (EGD) WITH PROPOFOL N/A 09/15/2017   Procedure: ESOPHAGOGASTRODUODENOSCOPY (EGD) WITH PROPOFOL;  Surgeon: WLucilla Lame MD;  [facility];  Service: Endoscopy;  Laterality: N/A;  . FRACTURE SURGERY Right 03/19/2015   wrist  . FRACTURE SURGERY Left   . HIATAL HERNIA REPAIR    . ILIAC ARTERY STENT  12/15/2005   PTA and direct stenting rgt and lft common iliac arteries  . KYPHOPLASTY N/A 04/05/2019   Procedure: KYPHOPLASTY T12, L3, L4 L5;  Surgeon: Hessie Knows, MD;  Location: [facility] ORS;  Service: Orthopedics;  Laterality: N/A;  . NM GATED MYOVIEW ([place] HX)  05/'19; 8/'20   a) [facility]: Low Risk - no ischemia or infarct.  EF > 65%. no RWMA;; b) [facility]): Normal wall motion.  No ischemia or infarction.  . OPEN REDUCTION INTERNAL FIXATION (ORIF) DISTAL RADIAL FRACTURE Left 01/13/2019   Procedure: OPEN REDUCTION INTERNAL FIXATION (ORIF) DISTAL  RADIAL FRACTURE - LEFT - SLEEP APNEA;  Surgeon: Hessie Knows, MD;  Location: [facility] ORS;  Service: Orthopedics;  Laterality: Left;  . ORIF ELBOW FRACTURE Right 01/01/2017   Procedure: OPEN REDUCTION INTERNAL FIXATION (ORIF) ELBOW/OLECRANON FRACTURE;  Surgeon: Hessie Knows, MD;  Location: [facility] ORS;  Service: Orthopedics;  Laterality: Right;  . ORIF WRIST FRACTURE Right 03/19/2015   Procedure: OPEN REDUCTION INTERNAL FIXATION (ORIF) WRIST FRACTURE;  Surgeon: Hessie Knows, MD;  Location: [facility] ORS;  Service: Orthopedics;  Laterality: Right;  . SHOULDER ARTHROSCOPY Right   . THORACIC AORTA - CAROTID ANGIOGRAM  October 2007   Dr. 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 TRANSTHORACIC ECHOCARDIOGRAM  February 2016   [facility]: Normal LV function. Dilated left atrium.  Domingo Dimes ECHOCARDIOGRAM  04/2019   [facility] April 20, 2019): EF 55-60 %.  Mild LVH.  Relatively normal valves.    There were no vitals filed for this visit.   Subjective Assessment - 11/15/20 0934    Subjective Pt doing well today, reports his leg is feeling fine today, not sore like tuesday.    Patient is accompained by: Family member    Pertinent History Patient is returning to PT for evaluation of balance. Patient has been seen by this therapist in the past. PMH AAA, arthritis, basal cell carcinoma of eyelid, benign neoplasm of ascending/descending colon, bilateral iliac artery stenosis, colon polyps, compression fx of 4th lumbar vertebra (s/p kyphoplasty T12, L3-5), COPD, CAD, dementia, falls,  GERD, hemorroids, HOH, HTN, Lewy Body Dementia, macular degeneration disease, osteoporosis, PAF, prostate cancer, prostate disorder, sciatic pain, sleep apnea, temporary cerebral vascular dysfunction. Comes with aide, having multiple near falls per day per aide, mainly to the sides.    Currently in Pain? No/denies          INTERVENTION THIS DATE: -overground AMB intervals c RW, gait belt, minGuard assist 1x327f (2 laps counter clockwise) 2 minutes seated rest interval  1x3042f(2 laps counter clockwise) 2 minutes seated rest interval   -seated cable row 1x15 @ 12.5 lbs (multimodal cues for upright sitting, back off chair)  -10xSTS from chair+ airex hands-free (min guard assist, fatigue at 8x, LOB 2x) -seated cable row 1x15 @ 12.5 lbs (multimodal cues for upright sitting, back off chair)   Break for toiletting, bowel movement, modI  2x5055fMB s AD to/from BR  -10xSTS from chair+ airex hands-free (min guard assist, fatigue at 6, LOB x3)   -Seated foul shots with blue medicine-ball into 30 gallon storage  bin 1x10 (7 points)          PT Short Term Goals - 08/21/20 0959      PT SHORT TERM GOAL #1   Title Patient will be independent in home exercise program to improve strength/mobility for better functional independence with ADLs.    Baseline 11/11: HEP given 12/21: HEP compliant    Time 4    Period Weeks    Status Achieved    Target Date 08/09/20  PT Long Term Goals - 11/13/20 0946      PT LONG TERM GOAL #1   Title Patient will increase FOTO score to equal to or greater than 51% to demonstrate statistically significant improvement in mobility and quality of life.    Baseline 11/11: 42% 12/21: 46.8% 2/3: 36%    Time 12    Period Weeks    Status Partially Met    Target Date 12/27/20      PT LONG TERM GOAL #2   Title Patient will demonstrate an improved Berg Balance Score of > 40/56 as to demonstrate improved balance with ADLs such as sitting/standing and transfer balance and reduced fall risk.    Baseline 11/11: 34/56 12/21: 37/56 2/3: 35/56    Time 12    Period Weeks    Status On-going    Target Date 12/27/20      PT LONG TERM GOAL #3   Title Patient (> 15 years old) will complete five times sit to stand test in < 15 seconds without UE support indicating an increased LE strength and improved balance    Baseline 11/11; 23.35 seconds with UE support 12/21: 27.85 with one near LOB 2/3: 12.6 seconds with heavy BUE support due to confusion; 11/13/20: 12.79sec c BUE use    Time 12    Period Weeks    Status Achieved    Target Date 12/27/20      PT LONG TERM GOAL #4   Title Patient will increase 10 meter walk test to >1.24ms with LRAD  as to improve gait speed for better community ambulation and to reduce fall risk.    Baseline 11/11: 0.74 m/s without AD with one near LOB 12/21: 1.08 m/s; 3/15: 1.144m (fastest), 0.6428m(normal)    Time 12    Period Weeks    Status Achieved    Target Date 10/04/20      PT LONG TERM GOAL #5   Title Patient will increase BLE  gross strength to 4+/5 as to improve functional strength for independent gait, increased standing tolerance and increased ADL ability.    Baseline 11/11: see note 2/3: mentation limiting testing; 3/15: 5/5 hips and knees, 4+ Left hip IR), Ankle PF 2/5 bilat    Time 12    Period Weeks    Status On-going    Target Date 12/27/20      PT LONG TERM GOAL #6   Title Patient will increase six minute walk test distance to >1000 for progression to community ambulator and improve gait ability    Baseline 2/3: able to perform 4 minutes for 420 ft with rollator    Time 12    Period Weeks    Status On-going    Target Date 12/27/20                 Plan - 11/15/20 0935    Clinical Impression Statement Continued with current plan of care as laid out in evaluation and recent prior sessions. Pt remains motivated to advance progress toward goals. Rest breaks provided throughout, as fatigue creates frank changes to gross motor control in gait/balance. Started with overground intervals as he does better AMB than on Nustep. Transitioned to moderate loading full-body, large amplitude activities (squats, rows, presses). Pt does require varying levels of assistance and cuing for completion of exercises for correct form and sometimes due to pain/weakness. Pt would do better with higher surface (potentially 2 airex pads) to decrease exertion with STS and allow for higher repetitions,  particularly for balance component. Pt performs toilettng mid session, reports no need for assistance, then later says he nearly fell getting up from commode- would strongly recommend supervised toileting in future sessions. Pt closely monitored throughout session to maximize patient safety during interventions. Pt continues to demonstrate progress toward goals AEB progression of some interventions this date either in volume or intensity.    Personal Factors and Comorbidities Age;Comorbidity 3+;Fitness;Past/Current Experience;Sex;Social  Background;Time since onset of injury/illness/exacerbation;Transportation    Comorbidities AAA, arthritis, atherosclerotic heart disease, (s/p CABG), benign neoplasm of ascending/descending colon, COPD, CAD, dementia, dysrhythmia, frequent falls, GERD, hemorrhoid, HOH, HTN, PAF, prostate cancer.    Examination-Activity Limitations Bathing;Bend;Carry;Dressing;Continence;Stairs;Squat;Reach Overhead;Locomotion Level;Lift;Stand;Transfers;Toileting;Bed Mobility    Examination-Participation Restrictions Church;Cleaning;Community Activity;Driving;Interpersonal Relationship;Laundry;Volunteer;Shop;Personal Finances;Meal Prep;Yard Work    [employer] Evolving/Moderate complexity    Clinical Decision Making Moderate    Rehab Potential Fair    Clinical Impairments Affecting Rehab Potential (+) good support system, high prior level of function (-) currently undergoing radiation therapy for prostate cancer, memory deficits     PT Frequency 2x / week    PT Duration 12 weeks    PT Treatment/Interventions ADLs/Self Care Home Management;Aquatic Therapy;Cryotherapy;Moist Heat;Traction;Ultrasound;Therapeutic activities;Functional mobility training;Stair training;Gait training;DME Instruction;Therapeutic exercise;Balance training;Neuromuscular re-education;Patient/family education;Manual techniques;Passive range of motion;Energy conservation;Vestibular;Taping;Biofeedback;Electrical Stimulation;Iontophoresis 69m/ml Dexamethasone;Dry needling;Visual/perceptual remediation/compensation;Cognitive remediation;Canalith Repostioning    PT Next Visit Plan balance, strength, large amplitude movements    PT Home Exercise Plan No updates today    Consulted and Agree with Plan of Care Patient;Family member/caregiver    Family Member Consulted DTR ([name]           Patient will benefit from skilled therapeutic intervention in order to improve the following deficits and impairments:  Abnormal  gait,Cardiopulmonary status limiting activity,Decreased activity tolerance,Decreased balance,Decreased knowledge of precautions,Decreased endurance,Decreased coordination,Decreased cognition,Decreased knowledge of use of DME,Decreased mobility,Difficulty walking,Decreased safety awareness,Decreased strength,Impaired flexibility,Impaired perceived functional ability,Impaired sensation,Postural dysfunction,Improper body mechanics,Pain,Impaired vision/preception,Decreased range of motion  Visit Diagnosis: Muscle weakness (generalized)  Unsteadiness on feet  Other abnormalities of gait and mobility  Chronic low back pain, unspecified back pain laterality, unspecified whether sciatica present  Difficulty in walking, not elsewhere classified     Problem List Patient Active Problem List   Diagnosis Date Noted  . AAA (abdominal aortic aneurysm) without rupture ([place] 06/29/2020  . LBD (Lewy body dementia) ([place] 05/14/2020  . Numbness and tingling in left hand 03/02/2020  . Hematuria   . Acute kidney injury superimposed on CKD (HGlenvar [place]   . Sepsis due to gram-negative UTI ([place] 12/26/2019  . Left nephrolithiasis 12/26/2019  . Hydronephrosis, left 12/26/2019  . Status post abdominal aortic aneurysm (AAA) repair 04/15/2019  . Do not intubate, cardiopulmonary resuscitation (CPR)-only code status 10/08/2018  . Prostate cancer (HPoca 02/25/2018  . Prostatic cyst 01/15/2018  . Blood in stool   . Abnormal feces   . Stricture and stenosis of esophagus   . Mild cognitive impairment 08/18/2017  . Senile purpura ([place] 04/29/2017  . Anemia 04/29/2017  . Frequent falls 04/29/2017  . Simple chronic bronchitis ([place] 12/17/2016  . Benign localized hyperplasia of prostate with urinary obstruction 07/15/2016  . Elevated PSA 07/15/2016  . Incomplete emptying of bladder 07/15/2016  . Urge incontinence 07/15/2016  . Hypothyroidism 04/25/2016  . Macular degeneration 04/25/2016  . Erectile dysfunction due to  arterial insufficiency   . Ischemic heart disease with chronotropic incompetence   . CN (constipation) 02/10/2015  . DD (diverticular disease) 02/10/2015  . Weakness 02/10/2015  . Lichen planus 065/10/5463 . Restless  leg 02/10/2015  . 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 B12 deficiency 02/10/2015  . COPD (chronic obstructive pulmonary disease) ([place]) 11/14/2014  . Post Inflammatory Lung Changes 11/14/2014  . PAF (paroxysmal atrial fibrillation) (HCC) CHA2DS2-VASc = 4. AC = Eliquis   . Insomnia 12/09/2013  . OSA on CPAP   . Dyspnea on exertion - -essentially resolved with BB dose reduction & wgt loss 03/29/2013  . Overweight (BMI 25.0-29.9) -- Wgt back up 01/17/2013  . Hx of CABG   . Essential hypertension   . Hyperlipidemia with target LDL less than 70   . Aorto-iliac disease ([place])   . BCC (basal cell carcinoma), eyelid 01/03/2013  . Allergic rhinitis 08/17/2009  . Adaptation reaction 05/28/2009  . Acid reflux 05/28/2009  . Arthritis, degenerative 05/28/2009  . 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 Carotid artery occlusion without infarction 06/01/2006  . PAD (peripheral artery disease) - bilateral common iliac stents 12/15/2005  . Atherosclerotic heart disease of artery bypass graft 09/01/1997   10:13 AM, 11/15/20 Etta Grandchild, PT, DPT Physical Therapist - [place] [phone]     Etta Grandchild 11/15/2020, 9:44 AM  [place] MAIN [facility] SERVICES [address] [place], [place], [address] Phone: [phone]   Fax:  [phone]  Name: LOCHLANN MASTRANGELO MRN: [id number] Date of Birth: 1933/09/20

## 2020-11-16 ENCOUNTER — Emergency Department: Payer: Medicare Other

## 2020-11-16 ENCOUNTER — Emergency Department
Admission: EM | Admit: 2020-11-16 | Discharge: 2020-11-16 | Disposition: A | Discharge status: Home / Self Care | Payer: Medicare Other | Attending: Emergency Medicine | Admitting: Emergency Medicine

## 2020-11-16 ENCOUNTER — Other Ambulatory Visit: Payer: Self-pay

## 2020-11-16 DIAGNOSIS — Z87891 Personal history of nicotine dependence: Secondary | ICD-10-CM | POA: Diagnosis not present

## 2020-11-16 DIAGNOSIS — Z8546 Personal history of malignant neoplasm of prostate: Secondary | ICD-10-CM | POA: Diagnosis not present

## 2020-11-16 DIAGNOSIS — Z79899 Other long term (current) drug therapy: Secondary | ICD-10-CM | POA: Insufficient documentation

## 2020-11-16 DIAGNOSIS — S0990XA Unspecified injury of head, initial encounter: Secondary | ICD-10-CM | POA: Diagnosis not present

## 2020-11-16 DIAGNOSIS — M25522 Pain in left elbow: Secondary | ICD-10-CM | POA: Insufficient documentation

## 2020-11-16 DIAGNOSIS — G3183 Dementia with Lewy bodies: Secondary | ICD-10-CM | POA: Diagnosis not present

## 2020-11-16 DIAGNOSIS — E039 Hypothyroidism, unspecified: Secondary | ICD-10-CM | POA: Insufficient documentation

## 2020-11-16 DIAGNOSIS — Z7901 Long term (current) use of anticoagulants: Secondary | ICD-10-CM | POA: Diagnosis not present

## 2020-11-16 DIAGNOSIS — M25521 Pain in right elbow: Secondary | ICD-10-CM | POA: Insufficient documentation

## 2020-11-16 DIAGNOSIS — Y92009 Unspecified place in unspecified non-institutional (private) residence as the place of occurrence of the external cause: Secondary | ICD-10-CM

## 2020-11-16 DIAGNOSIS — Z85038 Personal history of other malignant neoplasm of large intestine: Secondary | ICD-10-CM | POA: Diagnosis not present

## 2020-11-16 DIAGNOSIS — Z85828 Personal history of other malignant neoplasm of skin: Secondary | ICD-10-CM | POA: Diagnosis not present

## 2020-11-16 DIAGNOSIS — Z955 Presence of coronary angioplasty implant and graft: Secondary | ICD-10-CM | POA: Diagnosis not present

## 2020-11-16 DIAGNOSIS — I480 Paroxysmal atrial fibrillation: Secondary | ICD-10-CM | POA: Insufficient documentation

## 2020-11-16 DIAGNOSIS — S32028A Other fracture of second lumbar vertebra, initial encounter for closed fracture: Secondary | ICD-10-CM | POA: Insufficient documentation

## 2020-11-16 DIAGNOSIS — M25551 Pain in right hip: Secondary | ICD-10-CM | POA: Insufficient documentation

## 2020-11-16 DIAGNOSIS — Z7951 Long term (current) use of inhaled steroids: Secondary | ICD-10-CM | POA: Diagnosis not present

## 2020-11-16 DIAGNOSIS — W19XXXA Unspecified fall, initial encounter: Secondary | ICD-10-CM

## 2020-11-16 DIAGNOSIS — I10 Essential (primary) hypertension: Secondary | ICD-10-CM | POA: Insufficient documentation

## 2020-11-16 DIAGNOSIS — S3992XA Unspecified injury of lower back, initial encounter: Secondary | ICD-10-CM | POA: Diagnosis present

## 2020-11-16 DIAGNOSIS — J449 Chronic obstructive pulmonary disease, unspecified: Secondary | ICD-10-CM | POA: Diagnosis not present

## 2020-11-16 DIAGNOSIS — S32020A Wedge compression fracture of second lumbar vertebra, initial encounter for closed fracture: Secondary | ICD-10-CM

## 2020-11-16 DIAGNOSIS — M25552 Pain in left hip: Secondary | ICD-10-CM | POA: Insufficient documentation

## 2020-11-16 MED ORDER — LIDOCAINE-EPINEPHRINE-TETRACAINE (LET) SOLUTION
3.0000 mL | Freq: Once | NASAL | Status: DC
  Filled 2020-11-16: qty 3

## 2020-11-16 MED ORDER — ACETAMINOPHEN 500 MG PO TABS
1000.0000 mg | ORAL_TABLET | Freq: Once | ORAL | Status: AC
  Administered 2020-11-16: 1000 mg via ORAL
  Filled 2020-11-16: qty 2

## 2020-11-16 NOTE — ED Triage Notes (Signed)
First RN note: Pt to ED via [facility] with c/o fall from [place]. Per EMS pt fell last night. Per EMS pt with hx of dementia and is at baseline, unknown if he hit his head, no obvious injury to his head at this time. Per EMS pt c/o lower back pain at this time.   Per EMS pt presents with DNR at this time.
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
## 2020-11-16 NOTE — ED Notes (Signed)
Patient assisted into vehicle by 2 RN's. Patient's daughter to return patient to facility.

## 2020-11-16 NOTE — ED Notes (Signed)
Patient assisted to edge of bed to use urinal. Patient unable to urinate, patient c/o pain in lower back.  Daughter requested tylenol for patient.

## 2020-11-16 NOTE — Discharge Instructions (Addendum)
Take OTC Tylenol as needed for pain. Keep the wounds clean and dry. Follow-up with Dr. Caryn Section for any increased pain.
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
## 2020-11-16 NOTE — ED Notes (Signed)
Cnl  ems  per  family

## 2020-11-16 NOTE — ED Notes (Signed)
[place]  [facility]  CALLED  FOR  TRANSPORT  TO  HOME PLACE  OF  [place]

## 2020-11-20 ENCOUNTER — Ambulatory Visit: Payer: Medicare Other

## 2020-11-22 ENCOUNTER — Ambulatory Visit: Payer: Medicare Other

## 2020-11-22 ENCOUNTER — Other Ambulatory Visit: Payer: Self-pay

## 2020-11-22 ENCOUNTER — Ambulatory Visit: Payer: Self-pay | Admitting: Urology

## 2020-11-22 VITALS — BP 139/97

## 2020-11-22 DIAGNOSIS — R2689 Other abnormalities of gait and mobility: Secondary | ICD-10-CM

## 2020-11-22 DIAGNOSIS — M545 Low back pain, unspecified: Secondary | ICD-10-CM

## 2020-11-22 DIAGNOSIS — R2681 Unsteadiness on feet: Secondary | ICD-10-CM

## 2020-11-22 DIAGNOSIS — G8929 Other chronic pain: Secondary | ICD-10-CM

## 2020-11-22 DIAGNOSIS — R262 Difficulty in walking, not elsewhere classified: Secondary | ICD-10-CM

## 2020-11-22 DIAGNOSIS — M6281 Muscle weakness (generalized): Secondary | ICD-10-CM | POA: Diagnosis not present
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
## 2020-11-26 ENCOUNTER — Other Ambulatory Visit: Payer: Self-pay | Admitting: Family Medicine

## 2020-11-26 ENCOUNTER — Telehealth: Payer: Self-pay | Admitting: Urology

## 2020-11-26 DIAGNOSIS — E785 Hyperlipidemia, unspecified: Secondary | ICD-10-CM

## 2020-11-26 NOTE — Telephone Encounter (Signed)
Pt son called office to cancel upcoming appt and to ask if the follow up appts are needed?  Pt is now in a memory care facility, difficult to get pt to appts. Son wants to know how important is this appt.  Please advise.

## 2020-11-27 ENCOUNTER — Ambulatory Visit: Payer: Medicare Other

## 2020-11-27 NOTE — Telephone Encounter (Signed)
Okay to cancel and he can be seen as needed

## 2020-11-28 ENCOUNTER — Ambulatory Visit: Payer: Medicare Other | Admitting: Urology

## 2020-11-28 NOTE — Telephone Encounter (Signed)
Called son [name] to relay message. [name] understood and agreed.

## 2020-11-29 ENCOUNTER — Ambulatory Visit: Payer: Medicare Other

## 2020-11-29 ENCOUNTER — Telehealth: Payer: Self-pay

## 2020-11-29 NOTE — Telephone Encounter (Signed)
Dr. Caryn Section, have you seen any orders for this?

## 2020-11-29 NOTE — Telephone Encounter (Signed)
no

## 2020-11-29 NOTE — Telephone Encounter (Signed)
Copied from [place] [phone]. Topic: General - Other >> Nov 29, 2020 11:17 AM Pawlus, Brayton Layman A wrote: Reason for CRM: Pt has been moved to assisted living. Caller requested orders be updated and nursing be added.

## 2020-12-04 ENCOUNTER — Ambulatory Visit: Payer: TRICARE For Life (TFL)

## 2020-12-06 ENCOUNTER — Ambulatory Visit: Payer: TRICARE For Life (TFL)

## 2020-12-11 ENCOUNTER — Ambulatory Visit: Payer: TRICARE For Life (TFL)

## 2020-12-13 ENCOUNTER — Ambulatory Visit: Payer: TRICARE For Life (TFL)

## 2020-12-18 ENCOUNTER — Ambulatory Visit: Payer: TRICARE For Life (TFL)

## 2020-12-20 ENCOUNTER — Ambulatory Visit: Payer: TRICARE For Life (TFL)

## 2020-12-21 ENCOUNTER — Other Ambulatory Visit: Payer: Self-pay

## 2020-12-21 ENCOUNTER — Other Ambulatory Visit: Payer: Self-pay | Admitting: Urology

## 2020-12-21 ENCOUNTER — Non-Acute Institutional Stay: Payer: Medicare Other | Admitting: Adult Health Nurse Practitioner

## 2020-12-21 ENCOUNTER — Other Ambulatory Visit: Payer: Self-pay | Admitting: Family Medicine

## 2020-12-21 VITALS — BP 124/60 | HR 78 | Wt 152.0 lb

## 2020-12-21 DIAGNOSIS — F0280 Dementia in other diseases classified elsewhere without behavioral disturbance: Secondary | ICD-10-CM

## 2020-12-21 DIAGNOSIS — Z515 Encounter for palliative care: Secondary | ICD-10-CM

## 2020-12-21 DIAGNOSIS — E785 Hyperlipidemia, unspecified: Secondary | ICD-10-CM

## 2020-12-21 DIAGNOSIS — R634 Abnormal weight loss: Secondary | ICD-10-CM

## 2020-12-21 DIAGNOSIS — G3183 Dementia with Lewy bodies: Secondary | ICD-10-CM

## 2020-12-21 NOTE — Progress Notes (Signed)
[facility] Consult Note Telephone: [phone]  Fax: [phone]    Date of encounter: 12/21/20 PATIENT NAME: William [name] [address] [place] [address]   [phone] (home)  DOB: 1934-02-12 MRN: [id number] PRIMARY CARE PROVIDER:    Monico Blitz NP  REFERRING PROVIDER:   Monico Blitz NP  RESPONSIBLE PARTY:    Contact Information    Name Relation Home Work Mobile   William [name] Son   [phone]   William [name] Daughter   [phone]   William [name],William [name] [phone]  [phone]       I met face to face with patient and family in facility. Palliative Care was asked to follow this patient by consultation request of  William Blitz NP to address advance care planning and complex medical decision making. This is [name] follow up visit.  Significant [name] and daughter present during visit today.  Spoke with son via telephone to update on today's visit.                                   ASSESSMENT AND PLAN / RECOMMENDATIONS:   Advance Care Planning/Goals of Care: Goals include to maximize quality of life and symptom management.   CODE STATUS: DNR  Symptom Management/Plan:  Lewy body dementia:  Patient having some decline and requiring more assistance.  Possibly due to underlying depression, see below.  Weight loss:  Patient having weight loss possibly due to underlying depression, see below.  Has been started on Mighty Shakes.    Depression:  Believe patient may have grief that has gone into depression since his move from home. Discussed with PCP and son starting on remeron for depression and appetite stimulant. Son has agreed to the plan.  PCP would like me to order it.  Faxed order to facility for remeron 7.5 mg PO nightly for depression/appetite stimulant. Will follow up in 2 weeks   Follow up Palliative Care Visit: Palliative care will continue to follow for complex medical decision making, advance  care planning, and clarification of goals. Return 2 weeks or prn.  Son encouraged to call with any questions or concerns  I spent 60 minutes providing this consultation. More than 50% of the time in this consultation was spent in counseling and care coordination.  PPS: 40%  HOSPICE ELIGIBILITY/DIAGNOSIS: TBD  Chief Complaint: Follow-up palliative visit  HISTORY OF PRESENT ILLNESS:  William [name] is [name] 87 y.o. year old male  with Lewy body dementia with parkinsonian is him, [name]. fib, CAD status post CABG x4, OSA on CPAP, COPD, HTN. Patient transitioned to [facility] about 6 weeks ago.  Patient has not been eating well and has lost weight.  On 11/16/20 he weighed 170 pounds and today weighs 152 pounds. Patient states that he does not like the food.  He seems more unstable on his feet. He did have [name] fall on 11/16/20 and was evaluated at ER and found to have [name] L2 compression fracture.  SO states that he was instructed to walk and use Tylenol for the pain.  Since being at the facility he has not been wanting to get up and walk like he used to.  He is always wanting to stay in bed.  HPI/ROS limited due to dementia.    History obtained from review of EMR, discussion with primary team, and interview with family, facility staff and William [name].  I reviewed available labs, medications, imaging, studies and related documents from the EMR.  Records reviewed and summarized above.    PHYSICAL EXAM:   General: NAD, frail appearing, thin Eyes: Anicteric and noninjected with no discharge noted ENMT: Moist mucous membranes Cardiovascular: regular rate and rhythm Pulmonary:Lung sounds clear; normal respiratory effort Abdomen: soft, nontender, + bowel sounds Extremities: no edema, no joint deformities Skin: no rasheson exposed skin Neurological: Weakness; [name]&Oto person and place   Thank you for the opportunity to participate in the care of William [name].  The palliative care team will continue to  follow. Please call our office at [phone] if we can be of additional assistance.   William [name] William Downer, NP , DNP  This chart was dictated using voice recognition software. Despite best efforts to proofread, errors can occur which can change the documentation meaning.   COVID-19 PATIENT SCREENING TOOL Asked and negative response unless otherwise noted:   Have you had symptoms of covid, tested positive or been in contact with someone with symptoms/positive test in the past 5-10 days?  Negative

## 2020-12-25 ENCOUNTER — Ambulatory Visit: Payer: TRICARE For Life (TFL)

## 2020-12-27 ENCOUNTER — Ambulatory Visit: Payer: TRICARE For Life (TFL)

## 2021-01-04 ENCOUNTER — Non-Acute Institutional Stay: Payer: Medicare Other | Admitting: Adult Health Nurse Practitioner

## 2021-01-04 ENCOUNTER — Other Ambulatory Visit: Payer: Self-pay

## 2021-01-04 ENCOUNTER — Encounter: Payer: Self-pay | Admitting: Adult Health Nurse Practitioner

## 2021-01-04 VITALS — BP 120/68 | HR 68

## 2021-01-04 DIAGNOSIS — R634 Abnormal weight loss: Secondary | ICD-10-CM

## 2021-01-04 DIAGNOSIS — F0280 Dementia in other diseases classified elsewhere without behavioral disturbance: Secondary | ICD-10-CM

## 2021-01-04 DIAGNOSIS — Z515 Encounter for palliative care: Secondary | ICD-10-CM

## 2021-01-04 NOTE — Progress Notes (Signed)
[facility] Consult Note Telephone: [phone]  Fax: [phone]    Date of encounter: 01/04/21 PATIENT NAME: [name] [address] [place] [address]   [phone] (home)  DOB: Oct 02, 1933 MRN: [id number] PRIMARY CARE PROVIDER:    Monico Blitz, NP  REFERRING PROVIDER:   Monico Blitz, NP  RESPONSIBLE PARTY:    Contact Information    Name Relation Home Work Mobile   [place] Son   [phone]   Reche Dixon Daughter   [phone]   Harris,Laura A Significant other [phone]  [phone]       I met face to face with patient in facility. Palliative Care was asked to follow this patient by consultation request of  Monico Blitz NP to address advance care planning and complex medical decision making. This is a follow up visit.  Spoke with son by phone to update on today's visit                                   ASSESSMENT AND PLAN / RECOMMENDATIONS:   Advance Care Planning/Goals of Care: Goals include to maximize quality of life and symptom management.   CODE STATUS: DNR  Symptom Management/Plan:  Lewy body dementia/Weight loss: Son states that the Remeron just got started this week so have not had a real chance to see if it will be effective or not.  Patient has follow-up visit with neurologist and for MRI in about a week and a half.  Continue follow-up and recommendations by neurology.  We will follow-up in 2 to 4 weeks to monitor for effectiveness of Remeron and for any changes in functional and nutritional status.  Son did state that his father is asking about his girlfriend and if it is wise to have her come visit again.  He did suggest having someone with her for the first few visits to make sure that she was having positive interactions with him.  Discussed that this would be wise as with his dementia and present decline that he needs positive interactions.  Follow up Palliative Care Visit:  Palliative care will continue to follow for complex medical decision making, advance care planning, and clarification of goals. Return 2-4 weeks or prn.  Son encouraged to call with any questions or concerns  I spent 60 minutes providing this consultation. More than 50% of the time in this consultation was spent in counseling and care coordination.  PPS: 40-30%  HOSPICE ELIGIBILITY/DIAGNOSIS: TBD  Chief Complaint: Follow-up palliative visit  HISTORY OF PRESENT ILLNESS:  [name] is a 87 y.o. year old male  with Lewy body dementia with parkinsonian is him, A. fib, CAD status post CABG x4, OSA on CPAP, COPD, HTN.  Patient continues to not eat very well.  Though there is no new weight recorded for today he does appear to continue to lose weight as his face looks more sunken.  Patient is pleasant today.  Today he gets up out of bed on his own and using walker to walk.  Some days he requires assistance to stand and to walk.  Patient is incontinent of bowel and bladder.  Patient requires assistance with ADLs.  Son does state that he comes in helps him shower.  Son states that they have had to keep his girlfriend from visiting is whenever she would come over they would argue and he would get agitated.  Patient is  still working with home health therapy.  Son does state that about 10 days he has an appointment with his neurologist and for follow-up MRI.  Patient unable to contribute to HPI/ROS related to dementia.  Staff do have concerns that he is still not eating much and that he wants to stay in bed all day.  History obtained from review of EMR and interview with family, facility staff and Mr. Vanetten.      PHYSICAL EXAM: MAC 29.5 cm General: NAD, frail appearing, thin Eyes: Anicteric and noninjected with no discharge noted ENMT: Moist mucous membranes Cardiovascular: regular rate and rhythm Pulmonary:Lung sounds clear; normal respiratory effort Abdomen: soft, nontender, + bowel  sounds Extremities: no edema, no joint deformities Skin: no rasheson exposed skin Neurological: Weakness; A&Oto person and place   Thank you for the opportunity to participate in the care of Mr. Badie.  The palliative care team will continue to follow. Please call our office at [phone] if we can be of additional assistance.   Merridy Pascoe Jenetta Downer, NP , DNP  This chart was dictated using voice recognition software. Despite best efforts to proofread, errors can occur which can change the documentation meaning.   COVID-19 PATIENT SCREENING TOOL Asked and negative response unless otherwise noted:   Have you had symptoms of covid, tested positive or been in contact with someone with symptoms/positive test in the past 5-10 days? negative

## 2021-01-24 ENCOUNTER — Emergency Department: Payer: Medicare Other

## 2021-01-24 ENCOUNTER — Inpatient Hospital Stay
Admission: EM | Admit: 2021-01-24 | Discharge: 2021-02-04 | DRG: 956 | Disposition: A | Discharge status: Skilled Nursing Facility | Payer: Medicare Other | Attending: Internal Medicine | Admitting: Internal Medicine

## 2021-01-24 ENCOUNTER — Other Ambulatory Visit: Payer: Self-pay

## 2021-01-24 DIAGNOSIS — S52502A Unspecified fracture of the lower end of left radius, initial encounter for closed fracture: Secondary | ICD-10-CM | POA: Diagnosis present

## 2021-01-24 DIAGNOSIS — H9190 Unspecified hearing loss, unspecified ear: Secondary | ICD-10-CM | POA: Diagnosis present

## 2021-01-24 DIAGNOSIS — M48061 Spinal stenosis, lumbar region without neurogenic claudication: Secondary | ICD-10-CM | POA: Diagnosis present

## 2021-01-24 DIAGNOSIS — I2510 Atherosclerotic heart disease of native coronary artery without angina pectoris: Secondary | ICD-10-CM | POA: Diagnosis present

## 2021-01-24 DIAGNOSIS — I480 Paroxysmal atrial fibrillation: Secondary | ICD-10-CM | POA: Diagnosis present

## 2021-01-24 DIAGNOSIS — S52612A Displaced fracture of left ulna styloid process, initial encounter for closed fracture: Secondary | ICD-10-CM | POA: Diagnosis present

## 2021-01-24 DIAGNOSIS — Z960 Presence of urogenital implants: Secondary | ICD-10-CM | POA: Diagnosis present

## 2021-01-24 DIAGNOSIS — F0390 Unspecified dementia without behavioral disturbance: Secondary | ICD-10-CM

## 2021-01-24 DIAGNOSIS — E43 Unspecified severe protein-calorie malnutrition: Secondary | ICD-10-CM | POA: Insufficient documentation

## 2021-01-24 DIAGNOSIS — S61512A Laceration without foreign body of left wrist, initial encounter: Secondary | ICD-10-CM | POA: Diagnosis present

## 2021-01-24 DIAGNOSIS — G3183 Dementia with Lewy bodies: Secondary | ICD-10-CM | POA: Diagnosis present

## 2021-01-24 DIAGNOSIS — M4856XA Collapsed vertebra, not elsewhere classified, lumbar region, initial encounter for fracture: Secondary | ICD-10-CM | POA: Diagnosis present

## 2021-01-24 DIAGNOSIS — S72142A Displaced intertrochanteric fracture of left femur, initial encounter for closed fracture: Secondary | ICD-10-CM | POA: Diagnosis present

## 2021-01-24 DIAGNOSIS — S32591A Other specified fracture of right pubis, initial encounter for closed fracture: Secondary | ICD-10-CM | POA: Diagnosis present

## 2021-01-24 DIAGNOSIS — S72009A Fracture of unspecified part of neck of unspecified femur, initial encounter for closed fracture: Secondary | ICD-10-CM | POA: Diagnosis present

## 2021-01-24 DIAGNOSIS — S72002A Fracture of unspecified part of neck of left femur, initial encounter for closed fracture: Secondary | ICD-10-CM | POA: Diagnosis not present

## 2021-01-24 DIAGNOSIS — G4733 Obstructive sleep apnea (adult) (pediatric): Secondary | ICD-10-CM | POA: Diagnosis present

## 2021-01-24 DIAGNOSIS — S32592A Other specified fracture of left pubis, initial encounter for closed fracture: Secondary | ICD-10-CM

## 2021-01-24 DIAGNOSIS — Z681 Body mass index (BMI) 19 or less, adult: Secondary | ICD-10-CM | POA: Diagnosis not present

## 2021-01-24 DIAGNOSIS — I10 Essential (primary) hypertension: Secondary | ICD-10-CM | POA: Diagnosis present

## 2021-01-24 DIAGNOSIS — K219 Gastro-esophageal reflux disease without esophagitis: Secondary | ICD-10-CM | POA: Diagnosis present

## 2021-01-24 DIAGNOSIS — W19XXXA Unspecified fall, initial encounter: Secondary | ICD-10-CM

## 2021-01-24 DIAGNOSIS — Z7901 Long term (current) use of anticoagulants: Secondary | ICD-10-CM

## 2021-01-24 DIAGNOSIS — J449 Chronic obstructive pulmonary disease, unspecified: Secondary | ICD-10-CM | POA: Diagnosis present

## 2021-01-24 DIAGNOSIS — Z20822 Contact with and (suspected) exposure to covid-19: Secondary | ICD-10-CM | POA: Diagnosis present

## 2021-01-24 DIAGNOSIS — J690 Pneumonitis due to inhalation of food and vomit: Secondary | ICD-10-CM | POA: Diagnosis not present

## 2021-01-24 DIAGNOSIS — M810 Age-related osteoporosis without current pathological fracture: Secondary | ICD-10-CM | POA: Diagnosis present

## 2021-01-24 DIAGNOSIS — R296 Repeated falls: Secondary | ICD-10-CM | POA: Diagnosis present

## 2021-01-24 DIAGNOSIS — Z419 Encounter for procedure for purposes other than remedying health state, unspecified: Secondary | ICD-10-CM

## 2021-01-24 DIAGNOSIS — Z8546 Personal history of malignant neoplasm of prostate: Secondary | ICD-10-CM

## 2021-01-24 DIAGNOSIS — F0280 Dementia in other diseases classified elsewhere without behavioral disturbance: Secondary | ICD-10-CM | POA: Diagnosis present

## 2021-01-24 DIAGNOSIS — Z85038 Personal history of other malignant neoplasm of large intestine: Secondary | ICD-10-CM

## 2021-01-24 DIAGNOSIS — Z85828 Personal history of other malignant neoplasm of skin: Secondary | ICD-10-CM

## 2021-01-24 DIAGNOSIS — R509 Fever, unspecified: Secondary | ICD-10-CM

## 2021-01-24 DIAGNOSIS — Z888 Allergy status to other drugs, medicaments and biological substances status: Secondary | ICD-10-CM

## 2021-01-24 DIAGNOSIS — Z87891 Personal history of nicotine dependence: Secondary | ICD-10-CM

## 2021-01-24 DIAGNOSIS — M1990 Unspecified osteoarthritis, unspecified site: Secondary | ICD-10-CM | POA: Diagnosis present

## 2021-01-24 DIAGNOSIS — Z885 Allergy status to narcotic agent status: Secondary | ICD-10-CM

## 2021-01-24 DIAGNOSIS — S52592D Other fractures of lower end of left radius, subsequent encounter for closed fracture with routine healing: Secondary | ICD-10-CM | POA: Diagnosis not present

## 2021-01-24 DIAGNOSIS — W19XXXD Unspecified fall, subsequent encounter: Secondary | ICD-10-CM | POA: Diagnosis not present

## 2021-01-24 DIAGNOSIS — Z79899 Other long term (current) drug therapy: Secondary | ICD-10-CM

## 2021-01-24 DIAGNOSIS — Z951 Presence of aortocoronary bypass graft: Secondary | ICD-10-CM

## 2021-01-24 LAB — CBC WITH DIFFERENTIAL/PLATELET
Abs Immature Granulocytes: 0.04 10*3/uL (ref 0.00–0.07)
Basophils Absolute: 0.1 10*3/uL (ref 0.0–0.1)
Basophils Relative: 1 %
Eosinophils Absolute: 0.2 10*3/uL (ref 0.0–0.5)
Eosinophils Relative: 2 %
HCT: 33.9 % — ABNORMAL LOW (ref 39.0–52.0)
Hemoglobin: 11.3 g/dL — ABNORMAL LOW (ref 13.0–17.0)
Immature Granulocytes: 0 %
Lymphocytes Relative: 19 %
Lymphs Abs: 1.9 10*3/uL (ref 0.7–4.0)
MCH: 31.7 pg (ref 26.0–34.0)
MCHC: 33.3 g/dL (ref 30.0–36.0)
MCV: 95 fL (ref 80.0–100.0)
Monocytes Absolute: 0.9 10*3/uL (ref 0.1–1.0)
Monocytes Relative: 9 %
Neutro Abs: 7.1 10*3/uL (ref 1.7–7.7)
Neutrophils Relative %: 69 %
Platelets: 158 10*3/uL (ref 150–400)
RBC: 3.57 MIL/uL — ABNORMAL LOW (ref 4.22–5.81)
RDW: 15.5 % (ref 11.5–15.5)
WBC: 10.2 10*3/uL (ref 4.0–10.5)
nRBC: 0 % (ref 0.0–0.2)

## 2021-01-24 LAB — BASIC METABOLIC PANEL
Anion gap: 9 (ref 5–15)
BUN: 16 mg/dL (ref 8–23)
CO2: 26 mmol/L (ref 22–32)
Calcium: 9 mg/dL (ref 8.9–10.3)
Chloride: 104 mmol/L (ref 98–111)
Creatinine, Ser: 0.87 mg/dL (ref 0.61–1.24)
GFR, Estimated: >60 mL/min (ref >60)
Glucose, Bld: 107 mg/dL — ABNORMAL HIGH (ref 70–99)
Potassium: 4.1 mmol/L (ref 3.5–5.1)
Sodium: 139 mmol/L (ref 135–145)

## 2021-01-24 MED ORDER — ZOLPIDEM TARTRATE 5 MG PO TABS: 5.0000 mg | ORAL_TABLET | Freq: Every evening | ORAL | Status: DC | PRN

## 2021-01-24 MED ORDER — STERILE WATER FOR INJECTION IV SOLN: INTRAVENOUS | Status: DC

## 2021-01-24 MED ORDER — SENNOSIDES-DOCUSATE SODIUM 8.6-50 MG PO TABS: 1.0000 | ORAL_TABLET | Freq: Every evening | ORAL | Status: DC | PRN

## 2021-01-24 MED ORDER — FENTANYL CITRATE (PF) 100 MCG/2ML IJ SOLN
25.0000 ug | INTRAMUSCULAR | Status: DC | PRN
  Administered 2021-01-25 (×3): 25 ug via INTRAVENOUS
  Filled 2021-01-24 (×3): qty 2

## 2021-01-24 MED ORDER — LIDOCAINE HCL (PF) 1 % IJ SOLN
INTRAMUSCULAR | Status: AC
  Filled 2021-01-24: qty 5

## 2021-01-24 MED ORDER — BISACODYL 5 MG PO TBEC: 5.0000 mg | DELAYED_RELEASE_TABLET | Freq: Every day | ORAL | Status: DC | PRN

## 2021-01-24 MED ORDER — HYDROCODONE-ACETAMINOPHEN 5-325 MG PO TABS
1.0000 | ORAL_TABLET | Freq: Four times a day (QID) | ORAL | Status: DC | PRN
  Administered 2021-01-25 – 2021-01-27 (×2): 1 via ORAL
  Filled 2021-01-24 (×3): qty 1

## 2021-01-24 MED ORDER — BUPIVACAINE HCL (PF) 0.5 % IJ SOLN
10.0000 mL | Freq: Once | INTRAMUSCULAR | Status: AC
  Administered 2021-01-24: 10 mL
  Filled 2021-01-24: qty 30

## 2021-01-24 MED ORDER — LIDOCAINE HCL 1 % IJ SOLN
10.0000 mL | Freq: Once | INTRAMUSCULAR | Status: AC
  Filled 2021-01-24: qty 10

## 2021-01-24 MED ORDER — MORPHINE SULFATE (PF) 2 MG/ML IV SOLN: 0.5000 mg | INTRAVENOUS | Status: DC | PRN

## 2021-01-24 MED ORDER — ENOXAPARIN SODIUM 40 MG/0.4ML IJ SOSY
40.0000 mg | PREFILLED_SYRINGE | INTRAMUSCULAR | Status: DC
  Administered 2021-01-25: 40 mg via SUBCUTANEOUS
  Filled 2021-01-24: qty 0.4

## 2021-01-24 MED ORDER — ACETAMINOPHEN 325 MG PO TABS
650.0000 mg | ORAL_TABLET | Freq: Once | ORAL | Status: DC
  Filled 2021-01-24 (×2): qty 2

## 2021-01-24 NOTE — H&P (Signed)
History and Physical    [name] YIF:[id number] DOB: 07/07/1934 DOA: 01/24/2021  PCP: [name], MD   Patient coming from: Home  I have personally briefly reviewed patient's old medical records in [place]  Chief Complaint: Fall, pain left hip  HPI: [name] is a 87 y.o. male with medical history significant for Paroxysmal A. fib on Eliquis, COPD, HTN, CAD with history of CABG, OSA on CPAP and dementia who presents from home following an accidental fall in which she stepped back falling onto his left side with immediate pain in his left hip and left wrist.  Had no loss of consciousness and denies hitting head.  He was not recently ill, and denies recent nausea, vomiting, chest pain or shortness of breath, abdominal pain or change in bowel habit ED course: On arrival, afebrile with BP 115/73, pulse 86 respirations 14 with O2 sat 95% on room air.  Blood work unremarkable except for hemoglobin of 11.3 which is his baseline. EKG, personally viewed and interpreted: Sinus at 86 with nonspecific ST-T wave changes Imaging: Chest x-ray: No acute disease Left wrist x-ray: Displaced fracture distal radial diaphysis Pelvis and left hip x-ray: Mildly displaced intertrochanteric left hip fracture.  Nondisplaced fracture right inferior pubic ramus  The ED provider spoke with orthopedist, Dr. Mack Guise who will wait for Eliquis washout prior to taken to the OR.  Hospitalist consulted for admission.  Review of Systems: As per HPI otherwise all other systems on review of systems negative.    Past Medical History:  Diagnosis Date  . AAA (abdominal aortic aneurysm) ([place]) 05/2019   Relativley Rapid progression from June-Aug 2020 (4.6 - 5.4 cm): s/p EVAR (Dr. [facility]): EVAR - 23 mm prox, 12 cm distal & x 18 cm length Gore Excluder Endoprosthesis Main body (via RFA distal to lowest Renal A); 80m x 14 cm L Contralateral Limb for L Iliac - extended with 8 mm x 6cm LifeStar  stent (post-dilated with 7 mm DEB).  Additional R Iliac PTA not required.   . 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 ([place] 09/15/2017   09-15-2017 DIAGNOSIS:  A. COLON POLYP X 2, ASCENDING; COLD SNARE:  - TUBULAR ADENOMA.  - NEGATIVE FOR HIGH-GRADE DYSPLASIA AND MALIGNANCY.  - NO ADDITIONAL TISSUE IDENTIFIED; FECAL MATERIAL PRESENT.   B. COLON POLYP X 2, DESCENDING; COLD SNARE:  - ADENOCARCINOMA ARISING IN A DYSPLASTIC SESSILE SERRATED ADENOMA, WITH  LYMPHOVASCULAR INVASION, SEE COMMENT.  - TUBULAR ADENOMA, 2 FRAGMENTS, NEGATIVE FO  . Arthritis   . Basal cell carcinoma of eyelid 01/03/2013   2019 - R side of Nose  . Benign neoplasm of ascending colon   . Benign neoplasm of descending colon   . Bilateral iliac artery stenosis ([place]   Bilateral Iliac A stenting; extension of EVAR limbs into both Iliacs -- additional stent placed in L Iliac.  . Cancer ([place]    Skin cancer  . Colon polyps   . Complication of anesthesia    severe confusion agitation requiring hospital admission x 4 days  . Compression fracture of fourth lumbar vertebra ([place] 04/2019   - s/p Kyphoplasty; T12, L3-5 ([facility]  . COPD (chronic obstructive pulmonary disease) (HCC)    Centrilobular Emphysema  . Coronary artery disease involving native coronary artery without angina pectoris 1998   - s/p CABG, occluded SVG-D1; patent LIMA-LAD, SVG-OM, SVG- RCA  . Dementia ([place]    memory loss - started noting late 2019  . Falls frequently  broke foot  . Fracture 2021   left foot  . GERD (gastroesophageal reflux disease)   . Hemorrhoid 02/10/2015  . History of hiatal hernia   . History of kidney stones   . HOH (hard of hearing)   . Hypertension   . Lewy body dementia ([place]) 04/04/2020  . Macular degeneration disease   . Osteoporosis   . PAF (paroxysmal atrial fibrillation) ([place])    on Eliquis for OAC - Rate Control   . Prostate cancer ([place])   . Prostate disorder   . Sciatic pain    Chronic  . Sleep apnea    cpap  . Temporary  cerebral vascular dysfunction 02/10/2015   Had negative work-up.  July, 2012.  No medication changes, had forgot them that day, got dehydrated.   . Urinary incontinence     Past Surgical History:  Procedure Laterality Date  . ABDOMINAL AORTIC ENDOVASCULAR STENT GRAFT Bilateral 05/18/2019   Procedure: ABDOMINAL AORTIC ENDOVASCULAR STENT GRAFT;  Surgeon: Algernon Huxley, MD;  Location: [facility] ORS;  Service: Vascular;  Laterality: Bilateral;  . ANKLE SURGERY Left    ORIF   . Arch aortogram and carotid aortogram  06/02/2006   Dr Oneida Alar did surgery  . BASAL CELL CARCINOMA EXCISION  04/2016   Dermatology  . CARDIAC CATHETERIZATION  01/30/1998    (Dr. Linard Millers): Native LAD & mRCA CTO. 90% D1. Mod Cx. SVG-D1 CTO. SVG-RCA & SVG-OM along with LIMA-LAD patent;  LV NORMAL Fxn  . CAROTID ENDARTERECTOMY Left Oct. 29, 2007   Dr. Oneida Alar:  . CATARACT EXTRACTION W/PHACO Right 03/05/2017   Procedure: CATARACT EXTRACTION PHACO AND INTRAOCULAR LENS PLACEMENT (IOC);  Surgeon: Leandrew Koyanagi, MD;  Location: [facility] ORS;  Service: Ophthalmology;  Laterality: Right;  [place] 00:58.5 AP% 10.6 CDE 6.20 Fluid Pack lot # [id number]  . CATARACT EXTRACTION W/PHACO Left 04/15/2017   Procedure: CATARACT EXTRACTION PHACO AND INTRAOCULAR LENS PLACEMENT ([place])  Left;  Surgeon: Leandrew Koyanagi, MD;  Location: [place];  Service: Ophthalmology;  Laterality: Left;  . COLONOSCOPY WITH PROPOFOL N/A 09/15/2017   Procedure: COLONOSCOPY WITH PROPOFOL;  Surgeon: Lucilla Lame, MD;  Location: [facility] ENDOSCOPY;  Service: Endoscopy;  Laterality: N/A;  . COLONOSCOPY WITH PROPOFOL N/A 10/26/2018   Procedure: COLONOSCOPY WITH PROPOFOL;  Surgeon: Lucilla Lame, MD;  Location: [facility] ENDOSCOPY;  Service: Endoscopy;  Laterality: N/A;  . CORONARY ARTERY BYPASS GRAFT  1998   LIMA-LAD, SVG-OM, SVG-RPDA, SVG-DIAG  . CPET / MET  12/2012   Mild chronotropic incompetence - read 82% of predicted; also reduced effort; peak VO2 15.7 / 75% (did not reach  Max effort) -- suggested ischemic response in last 1.5 minutes of exercise. Normal pulmonary function on PFTs but poor response to  . CYSTOSCOPY W/ RETROGRADES Left 01/13/2020   Procedure: CYSTOSCOPY WITH RETROGRADE PYELOGRAM;  Surgeon: Billey Co, MD;  Location: [facility] ORS;  Service: Urology;  Laterality: Left;  . CYSTOSCOPY WITH STENT PLACEMENT Left 12/26/2019   Procedure: CYSTOSCOPY WITH STENT PLACEMENT;  Surgeon: Billey Co, MD;  Location: [facility] ORS;  Service: Urology;  Laterality: Left;  . CYSTOSCOPY/URETEROSCOPY/HOLMIUM LASER/STENT PLACEMENT Left 01/13/2020   Procedure: CYSTOSCOPY/URETEROSCOPY/HOLMIUM LASER/STENT EXCHANGE;  Surgeon: Billey Co, MD;  Location: [facility] ORS;  Service: Urology;  Laterality: Left;  . ENDOVASCULAR REPAIR/STENT GRAFT N/A 05/18/2019   Procedure: ENDOVASCULAR REPAIR/STENT GRAFT;  Surgeon: Algernon Huxley, MD;  Location: [facility] INVASIVE CV LAB;; EVAR - 23 mm prox, 12 cm distal & x 18 cm length Gore Excluder Endoprosthesis Main body (  via RFA distal to lowest Renal A); 43m x 14 cm L Contralateral Limb for L Iliac - extended with 8 mm x 6cm LifeStar stent; no additional R Iliac PTA needed.   . ESOPHAGOGASTRODUODENOSCOPY (EGD) WITH PROPOFOL N/A 09/15/2017   Procedure: ESOPHAGOGASTRODUODENOSCOPY (EGD) WITH PROPOFOL;  Surgeon: WLucilla Lame MD;  [facility];  Service: Endoscopy;  Laterality: N/A;  . FRACTURE SURGERY Right 03/19/2015   wrist  . FRACTURE SURGERY Left   . HIATAL HERNIA REPAIR    . ILIAC ARTERY STENT  12/15/2005   PTA and direct stenting rgt and lft common iliac arteries  . KYPHOPLASTY N/A 04/05/2019   Procedure: KYPHOPLASTY T12, L3, L4 L5;  Surgeon: MHessie Knows MD;  Location: [facility] ORS;  Service: Orthopedics;  Laterality: N/A;  . NM GATED MYOVIEW ([place])  05/'19; 8/'20   a) [facility]: Low Risk - no ischemia or infarct.  EF > 65%. no RWMA;; b) ([facility]: Normal wall motion.  No ischemia or infarction.  . OPEN REDUCTION INTERNAL FIXATION  (ORIF) DISTAL RADIAL FRACTURE Left 01/13/2019   Procedure: OPEN REDUCTION INTERNAL FIXATION (ORIF) DISTAL  RADIAL FRACTURE - LEFT - SLEEP APNEA;  Surgeon: MHessie Knows MD;  Location: [facility] ORS;  Service: Orthopedics;  Laterality: Left;  . ORIF ELBOW FRACTURE Right 01/01/2017   Procedure: OPEN REDUCTION INTERNAL FIXATION (ORIF) ELBOW/OLECRANON FRACTURE;  Surgeon: MHessie Knows MD;  Location: [facility] ORS;  Service: Orthopedics;  Laterality: Right;  . ORIF WRIST FRACTURE Right 03/19/2015   Procedure: OPEN REDUCTION INTERNAL FIXATION (ORIF) WRIST FRACTURE;  Surgeon: MHessie Knows MD;  Location: [facility] ORS;  Service: Orthopedics;  Laterality: Right;  . SHOULDER ARTHROSCOPY Right   . THORACIC AORTA - CAROTID ANGIOGRAM  October 2007   Dr. 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 TRANSTHORACIC ECHOCARDIOGRAM  February 2016   [facility]: Normal LV function. Dilated left atrium.  .Domingo DimesECHOCARDIOGRAM  04/2019   [facility] EF 55-60 %.  Mild LVH.  Relatively normal valves.     reports that he quit smoking about 23 years ago. His smoking use included cigarettes. He has never used smokeless tobacco. He reports that he does not drink alcohol and does not use drugs.  Allergies  Allergen Reactions  . Clonazepam Other (See Comments)    Altered mental status  . Codeine Other (See Comments)    Altered mental status.  . Ropinirole Swelling    Other reaction(s): Hallucination  . Hydrocodone     confusion  . Niacin And Related Other (See Comments)    Flushing of skin  . Oxycodone Other (See Comments)    confusion confusion  . Tramadol     Other reaction(s): Hallucination    Family History  Problem Relation Age of Onset  . Ulcers Mother        Peptic  . Dementia Mother   . Alcohol abuse Father       Prior to Admission medications   Medication Sig Start Date End Date Taking? Authorizing  Provider  acetaminophen (TYLENOL) 500 MG tablet Take 500-1,000 mg by mouth every 6 (six) hours as needed for mild pain or moderate pain.     [provider]  ELIQUIS 5 MG TABS tablet TAKE ONE TABLET TWICE DAILY 09/03/20   HLeonie Man MD  ezetimibe (ZETIA) 10 MG tablet TAKE 1 TABLET BY MOUTH DAILY 12/21/20   FBirdie Sons MD  finasteride (PROSCAR) 5 MG  tablet TAKE ONE TABLET BY MOUTH EVERY DAY 12/21/20   Stoioff, Ronda Fairly, MD  fluticasone (FLONASE) 50 MCG/ACT nasal spray PLACE TWO SPRAYS IN BOTH NOSRTILS EVERY DAY AS NEEDED FOR ALLERGIES 08/07/20   [name], MD  lisinopril (ZESTRIL) 2.5 MG tablet TAKE ONE TABLET BY MOUTH TWICE DAILY 10/05/20   [name], MD  Magnesium Oxide 500 MG TABS Take 500 mg by mouth every evening.    [provider]  melatonin 3 MG TABS tablet Take 0.5 tablets (1.5 mg total) by mouth at bedtime. 05/28/20   [name], MD  metoprolol succinate (TOPROL-XL) 25 MG 24 hr tablet Take 1 tablet (25 mg total) by mouth 2 (two) times daily. Take with or immediately following a meal. Patient taking differently: Take 12.5 mg by mouth 2 (two) times daily. Take with or immediately following a meal. 10/23/20   [name], MD  metoprolol succinate (TOPROL-XL) 50 MG 24 hr tablet TAKE ONE TABLET BY MOUTH EVERY DAY TAKE WITH OR IMMEDIATELY FOLLOWING A MEAL 12/21/20   [name], MD  Multiple Vitamins-Minerals (PRESERVISION AREDS 2 PO) Take 1 tablet by mouth 2 (two) times daily.     [provider]  MYRBETRIQ 50 MG TB24 tablet TAKE ONE TABLET BY MOUTH EVERY DAY 12/21/20   Stoioff, Ronda Fairly, MD  pantoprazole (PROTONIX) 40 MG tablet TAKE ONE TABLET BY MOUTH EVERY DAY 11/02/20   [name], MD  QUEtiapine (SEROQUEL) 25 MG tablet Take 12.5 mg by mouth.     [provider]  rosuvastatin (CRESTOR) 20 MG tablet TAKE ONE TABLET BY MOUTH EVERY DAY 12/21/20   [name], MD  umeclidinium-vilanterol (ANORO ELLIPTA) 62.5-25 MCG/INH  AEPB Inhale 1 puff into the lungs daily.  10/25/19   [provider]    Physical Exam: Vitals:   01/24/21 2026 01/24/21 2028  BP:  115/73  Pulse:  86  Resp:  14  Temp:  98 F (36.7 C)  TempSrc:  Oral  SpO2: 95% 96%  Weight:  68.9 kg  Height:  6' (1.829 m)     Vitals:   01/24/21 2026 01/24/21 2028  BP:  115/73  Pulse:  86  Resp:  14  Temp:  98 F (36.7 C)  TempSrc:  Oral  SpO2: 95% 96%  Weight:  68.9 kg  Height:  6' (1.829 m)      Constitutional: Alert and oriented x 3 . Not in any apparent distress HEENT:      Head: Normocephalic and atraumatic.         Eyes: PERLA, EOMI, Conjunctivae are normal. Sclera is non-icteric.       Mouth/Throat: Mucous membranes are moist.       Neck: Supple with no signs of meningismus. Cardiovascular: Regular rate and rhythm. No murmurs, gallops, or rubs. 2+ symmetrical distal pulses are present . No JVD. No LE edema Respiratory: Respiratory effort normal .Lungs sounds clear bilaterally. No wheezes, crackles, or rhonchi.  Gastrointestinal: Soft, non tender, and non distended with positive bowel sounds.  Genitourinary: No CVA tenderness. Musculoskeletal:  Pain on palpation left hip.  Left wrist splint. No cyanosis, or erythema of extremities. Neurologic:  Face is symmetric. Moving all extremities. No gross focal neurologic deficits . Skin: Skin is warm, dry.  No rash or ulcers Psychiatric: Mood and affect are normal    Labs on Admission: I have personally reviewed following labs and imaging studies  CBC: Recent Labs  Lab 01/24/21 2041  WBC 10.2  NEUTROABS 7.1  HGB 11.3*  HCT 33.9*  MCV 95.0  PLT 102   Basic Metabolic Panel: Recent Labs  Lab 01/24/21 2041  NA 139  K 4.1  CL 104  CO2 26  GLUCOSE 107*  BUN 16  CREATININE 0.87  CALCIUM 9.0   GFR: Estimated Creatinine Clearance: 60.5 mL/min (by C-G formula based on SCr of 0.87 mg/dL). Liver Function Tests: No results for input(s): AST, ALT, ALKPHOS, BILITOT,  PROT, ALBUMIN in the last 168 hours. No results for input(s): LIPASE, AMYLASE in the last 168 hours. No results for input(s): AMMONIA in the last 168 hours. Coagulation Profile: No results for input(s): INR, PROTIME in the last 168 hours. Cardiac Enzymes: No results for input(s): CKTOTAL, CKMB, CKMBINDEX, TROPONINI in the last 168 hours. BNP (last 3 results) No results for input(s): PROBNP in the last 8760 hours. HbA1C: No results for input(s): HGBA1C in the last 72 hours. CBG: No results for input(s): GLUCAP in the last 168 hours. Lipid Profile: No results for input(s): CHOL, HDL, LDLCALC, TRIG, CHOLHDL, LDLDIRECT in the last 72 hours. Thyroid Function Tests: No results for input(s): TSH, T4TOTAL, FREET4, T3FREE, THYROIDAB in the last 72 hours. Anemia Panel: No results for input(s): VITAMINB12, FOLATE, FERRITIN, TIBC, IRON, RETICCTPCT in the last 72 hours. Urine analysis:    Component Value Date/Time   COLORURINE YELLOW (A) 09/27/2020 1131   APPEARANCEUR CLEAR (A) 09/27/2020 1131   APPEARANCEUR Cloudy (A) 01/25/2020 1009   LABSPEC >1.046 (H) 09/27/2020 1131   LABSPEC 1.011 10/01/2012 0420   PHURINE 7.0 09/27/2020 [place] 09/27/2020 1131   GLUCOSEU Negative 10/01/2012 0420   HGBUR NEGATIVE 09/27/2020 1131   BILIRUBINUR Negative 11/07/2020 1115   BILIRUBINUR Negative 01/25/2020 1009   BILIRUBINUR Negative 10/01/2012 [place] 09/27/2020 1131   PROTEINUR Negative 11/07/2020 1115   PROTEINUR NEGATIVE 09/27/2020 1131   UROBILINOGEN 0.2 11/07/2020 1115   NITRITE negative 11/07/2020 1115   NITRITE NEGATIVE 09/27/2020 1131   LEUKOCYTESUR Negative 11/07/2020 1115   LEUKOCYTESUR NEGATIVE 09/27/2020 1131   LEUKOCYTESUR Negative 10/01/2012 0420    Radiological Exams on Admission: DG Chest 1 View  Result Date: 01/24/2021 CLINICAL DATA:  Left hip and wrist fractures.  Pre-op clearance exam EXAM: CHEST  1 VIEW COMPARISON:  04/04/2020 FINDINGS: The  heart size and mediastinal contours are within normal limits. Prior CABG again noted. Small calcified granuloma again seen in the right upper lobe. Both lungs are otherwise clear. The visualized skeletal structures are unremarkable. IMPRESSION: No active cardiopulmonary disease. Electronically Signed   By: Marlaine Hind M.D.   On: 01/24/2021 21:24   DG Wrist Complete Left  Result Date: 01/24/2021 CLINICAL DATA:  Fall. Left wrist pain and deformity. Initial encounter. EXAM: LEFT WRIST - COMPLETE 3+ VIEW COMPARISON:  None. FINDINGS: Internal fixation plate and screws are seen in the distal radius. A displaced fracture is seen involving the distal radial diaphysis, near the proximal margin of the fixation plate, with moderate dorsal angulation of the distal radius. No other acute fractures are identified. Old fracture deformity is seen involving the distal ulnar diaphysis and ulnar styloid process. Advanced osteoarthritis is seen involving the base of the thumb. Generalized osteopenia also noted. IMPRESSION: Displaced fracture of the distal radial diaphysis, near the proximal margin of the fixation plate, with moderate dorsal angulation. Old fracture deformities of distal ulnar diaphysis and ulnar styloid process. Electronically Signed   By: Marlaine Hind M.D.   On: 01/24/2021  21:18   CT Head Wo Contrast  Result Date: 01/24/2021 CLINICAL DATA:  Fall. EXAM: CT HEAD WITHOUT CONTRAST CT CERVICAL SPINE WITHOUT CONTRAST TECHNIQUE: Multidetector CT imaging of the head and cervical spine was performed following the standard protocol without intravenous contrast. Multiplanar CT image reconstructions of the cervical spine were also generated. COMPARISON:  11/16/2020 FINDINGS: CT HEAD FINDINGS Brain: There is no evidence for acute hemorrhage, hydrocephalus, mass lesion, or abnormal extra-axial fluid collection. No definite CT evidence for acute infarction. Diffuse loss of parenchymal volume is consistent with atrophy.  Patchy low attenuation in the deep hemispheric and periventricular white matter is nonspecific, but likely reflects chronic microvascular ischemic demyelination. Vascular: No hyperdense vessel or unexpected calcification. Skull: No evidence for fracture. No worrisome lytic or sclerotic lesion. Sinuses/Orbits: The visualized paranasal sinuses and mastoid air cells are clear. Visualized portions of the globes and intraorbital fat are unremarkable. Other: None. CT CERVICAL SPINE FINDINGS Alignment: Normal. Skull base and vertebrae: No acute fracture. No primary bone lesion or focal pathologic process. Soft tissues and spinal canal: No prevertebral fluid or swelling. No visible canal hematoma. Disc levels: Diffuse loss of disc height with endplate degeneration noted in the cervical spine. Scattered facet osteoarthritis noted bilaterally. Upper chest: Biapical pleuroparenchymal scarring evident. Other: None. IMPRESSION: 1. No acute intracranial abnormality. Atrophy with chronic small vessel white matter ischemic disease. 2. Degenerative changes in the cervical spine without fracture. Electronically Signed   By: Misty Stanley M.D.   On: 01/24/2021 21:44   CT Cervical Spine Wo Contrast  Result Date: 01/24/2021 CLINICAL DATA:  Fall. EXAM: CT HEAD WITHOUT CONTRAST CT CERVICAL SPINE WITHOUT CONTRAST TECHNIQUE: Multidetector CT imaging of the head and cervical spine was performed following the standard protocol without intravenous contrast. Multiplanar CT image reconstructions of the cervical spine were also generated. COMPARISON:  11/16/2020 FINDINGS: CT HEAD FINDINGS Brain: There is no evidence for acute hemorrhage, hydrocephalus, mass lesion, or abnormal extra-axial fluid collection. No definite CT evidence for acute infarction. Diffuse loss of parenchymal volume is consistent with atrophy. Patchy low attenuation in the deep hemispheric and periventricular white matter is nonspecific, but likely reflects chronic  microvascular ischemic demyelination. Vascular: No hyperdense vessel or unexpected calcification. Skull: No evidence for fracture. No worrisome lytic or sclerotic lesion. Sinuses/Orbits: The visualized paranasal sinuses and mastoid air cells are clear. Visualized portions of the globes and intraorbital fat are unremarkable. Other: None. CT CERVICAL SPINE FINDINGS Alignment: Normal. Skull base and vertebrae: No acute fracture. No primary bone lesion or focal pathologic process. Soft tissues and spinal canal: No prevertebral fluid or swelling. No visible canal hematoma. Disc levels: Diffuse loss of disc height with endplate degeneration noted in the cervical spine. Scattered facet osteoarthritis noted bilaterally. Upper chest: Biapical pleuroparenchymal scarring evident. Other: None. IMPRESSION: 1. No acute intracranial abnormality. Atrophy with chronic small vessel white matter ischemic disease. 2. Degenerative changes in the cervical spine without fracture. Electronically Signed   By: Misty Stanley M.D.   On: 01/24/2021 21:44   CT Lumbar Spine Wo Contrast  Result Date: 01/24/2021 CLINICAL DATA:  Initial evaluation for acute low back pain, recent fall. EXAM: CT LUMBAR SPINE WITHOUT CONTRAST TECHNIQUE: Multidetector CT imaging of the lumbar spine was performed without intravenous contrast administration. Multiplanar CT image reconstructions were also generated. COMPARISON:  Recent CT from 11/16/2020. FINDINGS: Segmentation: Standard. Lowest well-formed disc space labeled the L5-S1 level. Alignment: Levoscoliosis with apex at L3-4. No significant listhesis. Vertebrae: There is an acute on subacute burst type compression  fracture involving the L2 vertebral body with progressive collapse and height loss since previous. Height loss now measures up to 65% with progressive 7 mm bony retropulsion. Multiple additional chronic compression fractures again noted, with sequelae of prior vertebral augmentation at T12, L3, L4,  and L5. These are stable in appearance. Additional chronic fracture of the distal sacrum/coccyx noted (series 7, image 48). No other acute osseous abnormality. No discrete or worrisome osseous lesions. Paraspinal and other soft tissues: Paraspinous edema adjacent to the L2 compression fracture. Chronic fatty atrophy of the posterior paraspinous musculature. Treated intra-abdominal aneurysm with aorto bi-iliac stent in place. Colonic diverticulosis partially visualized. Probable nonobstructive left renal nephrolithiasis. Disc levels: L1-2: Progressive 7 mm bony retropulsion related to the L2 compression fracture with secondary flattening of the ventral thecal sac. Mild facet hypertrophy. Moderate spinal stenosis is now seen at this level. Moderate bilateral L1 foraminal narrowing. L2-3: Mild disc bulge with annular calcification. Mild facet hypertrophy. No spinal stenosis. Mild to moderate bilateral L2 foraminal narrowing. L3-4: Degenerative intervertebral disc space narrowing with diffuse disc bulge, eccentric to the right. Annular calcification with reactive endplate change. Moderate right worse than left facet hypertrophy. Mild canal with mild to moderate right worse than left lateral recess stenosis. Moderate right worse than left L3 foraminal narrowing. L4-5: Degenerative intervertebral disc space narrowing with diffuse disc bulge and disc desiccation. Prominent right-sided reactive endplate spurring. Moderate right with mild left facet degeneration. No significant spinal stenosis. Moderate right worse than left L4 foraminal narrowing. L5-S1: Degenerative intervertebral disc space narrowing with diffuse disc bulge and reactive endplate spurring. Mild facet hypertrophy. No significant spinal stenosis. Moderate bilateral L5 foraminal narrowing. IMPRESSION: 1. Acute on subacute compression fracture involving the L2 vertebral body with progressive collapse and height loss since previous, now measuring up to 65% with  progressive 7 mm bony retropulsion. Moderate spinal stenosis now seen at this level. 2. Multiple additional chronic compression fractures with sequelae of prior vertebral augmentation at T12, L3, L4, and L5, stable. 3. Additional chronic fracture of the distal sacrum/coccyx. 4. Moderate multilevel degenerative spondylosis with resultant moderate bilateral foraminal narrowing at L1 through L5. Electronically Signed   By: Jeannine Boga M.D.   On: 01/24/2021 22:06   DG Hip Unilat W or Wo Pelvis 2-3 Views Left  Result Date: 01/24/2021 CLINICAL DATA:  Fall.  Left hip pain.  Initial encounter. EXAM: DG HIP (WITH OR WITHOUT PELVIS) 2-3V LEFT COMPARISON:  11/16/2020 FINDINGS: Mildly displaced intertrochanteric left hip fracture seen. No evidence of dislocation. Generalized osteopenia is noted. A nondisplaced fracture seen involving the right inferior pubic ramus. No other pelvic fractures identified. IMPRESSION: Mildly displaced intertrochanteric left hip fracture. Nondisplaced fracture of right inferior pubic ramus. Osteopenia. Electronically Signed   By: Marlaine Hind M.D.   On: 01/24/2021 21:15     Assessment/Plan 87 year old male with history of paroxysmal A. fib on Eliquis, COPD, HTN, CAD with history of CABG, OSA on CPAP and dementia who presents from home following an accidental fall in which she stepped back falling onto his left side with immediate pain in his left hip and left wrist.     Closed left hip fracture, initial encounter ([place])   Accidental fall   Closed fracture of left distal radius   Pubic ramus fracture, left, closed, initial encounter ([place]) - Pain control - Ortho, Dr. Mack Guise consulted from the ER - Lovenox for DVT prophylaxis pending surgery which will be delayed by a day or 2 pending Eliquis washout - Pain control -  Further orders per Ortho  CAD with Hx of CABG - No complaints of chest pain and EKG nonacute - Continue home metoprolol, ezetimibe pending med rec     Essential hypertension - Continue home lisinopril and metoprolol    OSA on CPAP - Continue home CPAP    PAF (paroxysmal atrial fibrillation) -Holding Eliquis due to upcoming surgical procedure - Continue metoprolol   COPD (chronic obstructive pulmonary disease) ([place]) - Continue home inhalers - DuoNebs as needed    Dementia without behavioral disturbance (H[facility]) - Continue Seroquel    DVT prophylaxis: Lovenox  Code Status: full code  Family Communication:  None.  Disposition Plan: Back to previous home environment Consults called: [place] Status:At the time of admission, it appears that the appropriate admission status for this patient is INPATIENT. This is judged to be reasonable and necessary in order to provide the required intensity of service to ensure the patient's safety given the presenting symptoms, physical exam findings, and initial radiographic and laboratory data in the context of their  Comorbid conditions.   Patient requires inpatient status due to high intensity of service, high risk for further deterioration and high frequency of surveillance required.   I certify that at the point of admission it is my clinical judgment that the patient will require inpatient hospital care spanning beyond [place] MD [facility]     01/24/2021, 11:52 PM

## 2021-01-24 NOTE — ED Triage Notes (Signed)
Pt from home place [place] via ems. pt reports stepping back and then falling, staff assisted pt off the floor. Obvious deformity and skin tear to L wrist, pulses intact. Denies hitting head, LOC or any other areas of pain. On eliquis for afib

## 2021-01-24 NOTE — ED Provider Notes (Signed)
[facility] Emergency Department Provider Note  ____________________________________________   Event Date/Time   First MD Initiated Contact with Patient 01/24/21 2015     (approximate)  I have reviewed the triage vital signs and the nursing notes.   HISTORY  Chief Complaint Fall    HPI [name] is a 87 y.o. male with history of mild dementia here with fall.  The patient reportedly got up to try to go to the bathroom today.  Has history of recurrent falls due to this.  He fell, landing onto his left wrist and left hip.  He complains of 7 out of 10 aching, throbbing, left wrist pain.  Also has some mild left hip pain.  He is unable to further elaborate due to his mild dementia.  Denies any numbness or weakness.  He does not believe he hit his head or lost consciousness.  Denies any chest pain or shortness of breath.  Remainder of history limited due to dementia.        Past Medical History:  Diagnosis Date  . AAA (abdominal aortic aneurysm) ([place]) 05/2019   Relativley Rapid progression from June-Aug 2020 (4.6 - 5.4 cm): s/p EVAR (Dr. Lucky [facility]): EVAR - 23 mm prox, 12 cm distal & x 18 cm length Gore Excluder Endoprosthesis Main body (via RFA distal to lowest Renal A); 48m x 14 cm L Contralateral Limb for L Iliac - extended with 8 mm x 6cm LifeStar stent (post-dilated with 7 mm DEB).  Additional R Iliac PTA not required.   . 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 ([place] 09/15/2017   09-15-2017 DIAGNOSIS:  A. COLON POLYP X 2, ASCENDING; COLD SNARE:  - TUBULAR ADENOMA.  - NEGATIVE FOR HIGH-GRADE DYSPLASIA AND MALIGNANCY.  - NO ADDITIONAL TISSUE IDENTIFIED; FECAL MATERIAL PRESENT.   B. COLON POLYP X 2, DESCENDING; COLD SNARE:  - ADENOCARCINOMA ARISING IN A DYSPLASTIC SESSILE SERRATED ADENOMA, WITH  LYMPHOVASCULAR INVASION, SEE COMMENT.  - TUBULAR ADENOMA, 2 FRAGMENTS, NEGATIVE FO  . Arthritis   . Basal cell carcinoma of eyelid 01/03/2013   2019 - R side of Nose  .  Benign neoplasm of ascending colon   . Benign neoplasm of descending colon   . Bilateral iliac artery stenosis ([place] 2007   Bilateral Iliac A stenting; extension of EVAR limbs into both Iliacs -- additional stent placed in L Iliac.  . Cancer ([place]    Skin cancer  . Colon polyps   . Complication of anesthesia    severe confusion agitation requiring hospital admission x 4 days  . Compression fracture of fourth lumbar vertebra ([place] 04/2019   - s/p Kyphoplasty; T12, L3-5 ([facility]  . COPD (chronic obstructive pulmonary disease) (HCC)    Centrilobular Emphysema  . Coronary artery disease involving native coronary artery without angina pectoris 1998   - s/p CABG, occluded SVG-D1; patent LIMA-LAD, SVG-OM, SVG- RCA  . Dementia ([place]    memory loss - started noting late 2019  . Falls frequently    broke foot  . Fracture 2021   left foot  . GERD (gastroesophageal reflux disease)   . Hemorrhoid 02/10/2015  . History of hiatal hernia   . History of kidney stones   . HOH (hard of hearing)   . Hypertension   . Lewy body dementia ([place] 04/04/2020  . Macular degeneration disease   . Osteoporosis   . PAF (paroxysmal atrial fibrillation) ([place]    on Eliquis for OAC - Rate Control   . Prostate  cancer ([place])   . Prostate disorder   . Sciatic pain    Chronic  . Sleep apnea    cpap  . Temporary cerebral vascular dysfunction 02/10/2015   Had negative work-up.  July, 2012.  No medication changes, had forgot them that day, got dehydrated.   . Urinary incontinence     Patient Active Problem List   Diagnosis Date Noted  . Accidental fall 01/24/2021  . Closed left hip fracture, initial encounter ([place]) 01/24/2021  . Closed fracture of left distal radius 01/24/2021  . Dementia without behavioral disturbance ([place]) 01/24/2021  . Pubic ramus fracture, left, closed, initial encounter ([place]) 01/24/2021  . Hip fracture ([place]) 01/24/2021  . AAA (abdominal aortic aneurysm) without rupture ([place]) 06/29/2020  .  LBD (Lewy body dementia) ([place]) 05/14/2020  . Numbness and tingling in left hand 03/02/2020  . Hematuria   . Acute kidney injury superimposed on CKD ([place])   . Sepsis due to gram-negative UTI ([place]) 12/26/2019  . Left nephrolithiasis 12/26/2019  . Hydronephrosis, left 12/26/2019  . Status post abdominal aortic aneurysm (AAA) repair 04/15/2019  . Do not intubate, cardiopulmonary resuscitation (CPR)-only code status 10/08/2018  . Prostate cancer ([place]) 02/25/2018  . Prostatic cyst 01/15/2018  . Blood in stool   . Abnormal feces   . Stricture and stenosis of esophagus   . Mild cognitive impairment 08/18/2017  . Senile purpura ([place]) 04/29/2017  . Anemia 04/29/2017  . Frequent falls 04/29/2017  . Simple chronic bronchitis ([place]) 12/17/2016  . Benign localized hyperplasia of prostate with urinary obstruction 07/15/2016  . Elevated PSA 07/15/2016  . Incomplete emptying of bladder 07/15/2016  . Urge incontinence 07/15/2016  . Hypothyroidism 04/25/2016  . Macular degeneration 04/25/2016  . Erectile dysfunction due to arterial insufficiency   . Ischemic heart disease with chronotropic incompetence   . CN (constipation) 02/10/2015  . DD (diverticular disease) 02/10/2015  . Weakness 02/10/2015  . Lichen planus 49/70/2637  . Restless leg 02/10/2015  . 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 B12 deficiency 02/10/2015  . COPD (chronic obstructive pulmonary disease) ([place]) 11/14/2014  . Post Inflammatory Lung Changes 11/14/2014  . PAF (paroxysmal atrial fibrillation) (HCC) CHA2DS2-VASc = 4. AC = Eliquis   . Insomnia 12/09/2013  . OSA on CPAP   . Dyspnea on exertion - -essentially resolved with BB dose reduction & wgt loss 03/29/2013  . Overweight (BMI 25.0-29.9) -- Wgt back up 01/17/2013  . Hx of CABG   . Essential hypertension   . Hyperlipidemia with target LDL less than 70   . Aorto-iliac disease ([place])   . BCC (basal cell carcinoma), eyelid 01/03/2013  . Allergic rhinitis 08/17/2009  .  Adaptation reaction 05/28/2009  . Acid reflux 05/28/2009  . Arthritis, degenerative 05/28/2009  . 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 Carotid artery occlusion without infarction 06/01/2006  . PAD (peripheral artery disease) - bilateral common iliac stents 12/15/2005  . Atherosclerotic heart disease of artery bypass graft 09/01/1997    Past Surgical History:  Procedure Laterality Date  . ABDOMINAL AORTIC ENDOVASCULAR STENT GRAFT Bilateral 05/18/2019   Procedure: ABDOMINAL AORTIC ENDOVASCULAR STENT GRAFT;  Surgeon: Algernon Huxley, MD;  Location: [facility] ORS;  Service: Vascular;  Laterality: Bilateral;  . ANKLE SURGERY Left    ORIF   . Arch aortogram and carotid aortogram  06/02/2006   Dr Oneida Alar did surgery  . BASAL CELL CARCINOMA EXCISION  04/2016   Dermatology  . CARDIAC CATHETERIZATION  01/30/1998    (Dr. Linard Millers): Native  LAD & mRCA CTO. 90% D1. Mod Cx. SVG-D1 CTO. SVG-RCA & SVG-OM along with LIMA-LAD patent;  LV NORMAL Fxn  . CAROTID ENDARTERECTOMY Left Oct. 29, 2007   Dr. Oneida Alar:  . CATARACT EXTRACTION W/PHACO Right 03/05/2017   Procedure: CATARACT EXTRACTION PHACO AND INTRAOCULAR LENS PLACEMENT (IOC);  Surgeon: Leandrew Koyanagi, MD;  Location: [facility] ORS;  Service: Ophthalmology;  Laterality: Right;  [place] 00:58.5 AP% 10.6 CDE 6.20 Fluid Pack lot # [id number]  . CATARACT EXTRACTION W/PHACO Left 04/15/2017   Procedure: CATARACT EXTRACTION PHACO AND INTRAOCULAR LENS PLACEMENT ([place])  Left;  Surgeon: Leandrew Koyanagi, MD;  Location: [place];  Service: Ophthalmology;  Laterality: Left;  . COLONOSCOPY WITH PROPOFOL N/A 09/15/2017   Procedure: COLONOSCOPY WITH PROPOFOL;  Surgeon: Lucilla Lame, MD;  Location: [facility] ENDOSCOPY;  Service: Endoscopy;  Laterality: N/A;  . COLONOSCOPY WITH PROPOFOL N/A 10/26/2018   Procedure: COLONOSCOPY WITH PROPOFOL;  Surgeon: Lucilla Lame, MD;  Location: [facility] ENDOSCOPY;  Service: Endoscopy;  Laterality: N/A;  . CORONARY ARTERY BYPASS GRAFT   1998   LIMA-LAD, SVG-OM, SVG-RPDA, SVG-DIAG  . CPET / MET  12/2012   Mild chronotropic incompetence - read 82% of predicted; also reduced effort; peak VO2 15.7 / 75% (did not reach Max effort) -- suggested ischemic response in last 1.5 minutes of exercise. Normal pulmonary function on PFTs but poor response to  . CYSTOSCOPY W/ RETROGRADES Left 01/13/2020   Procedure: CYSTOSCOPY WITH RETROGRADE PYELOGRAM;  Surgeon: Billey Co, MD;  Location: [facility] ORS;  Service: Urology;  Laterality: Left;  . CYSTOSCOPY WITH STENT PLACEMENT Left 12/26/2019   Procedure: CYSTOSCOPY WITH STENT PLACEMENT;  Surgeon: Billey Co, MD;  Location: [facility] ORS;  Service: Urology;  Laterality: Left;  . CYSTOSCOPY/URETEROSCOPY/HOLMIUM LASER/STENT PLACEMENT Left 01/13/2020   Procedure: CYSTOSCOPY/URETEROSCOPY/HOLMIUM LASER/STENT EXCHANGE;  Surgeon: Billey Co, MD;  Location: [facility] ORS;  Service: Urology;  Laterality: Left;  . ENDOVASCULAR REPAIR/STENT GRAFT N/A 05/18/2019   Procedure: ENDOVASCULAR REPAIR/STENT GRAFT;  Surgeon: Algernon Huxley, MD;  Location: [facility] INVASIVE CV LAB;; EVAR - 23 mm prox, 12 cm distal & x 18 cm length Gore Excluder Endoprosthesis Main body (via RFA distal to lowest Renal A); 31m x 14 cm L Contralateral Limb for L Iliac - extended with 8 mm x 6cm LifeStar stent; no additional R Iliac PTA needed.   . ESOPHAGOGASTRODUODENOSCOPY (EGD) WITH PROPOFOL N/A 09/15/2017   Procedure: ESOPHAGOGASTRODUODENOSCOPY (EGD) WITH PROPOFOL;  Surgeon: WLucilla Lame MD;  [facility];  Service: Endoscopy;  Laterality: N/A;  . FRACTURE SURGERY Right 03/19/2015   wrist  . FRACTURE SURGERY Left   . HIATAL HERNIA REPAIR    . ILIAC ARTERY STENT  12/15/2005   PTA and direct stenting rgt and lft common iliac arteries  . KYPHOPLASTY N/A 04/05/2019   Procedure: KYPHOPLASTY T12, L3, L4 L5;  Surgeon: MHessie Knows MD;  Location: [facility] ORS;  Service: Orthopedics;  Laterality: N/A;  . NM GATED MYOVIEW ([place])  05/'19;  8/'20   a) [facility]: Low Risk - no ischemia or infarct.  EF > 65%. no RWMA;; b) ([facility]: Normal wall motion.  No ischemia or infarction.  . OPEN REDUCTION INTERNAL FIXATION (ORIF) DISTAL RADIAL FRACTURE Left 01/13/2019   Procedure: OPEN REDUCTION INTERNAL FIXATION (ORIF) DISTAL  RADIAL FRACTURE - LEFT - SLEEP APNEA;  Surgeon: MHessie Knows MD;  Location: [facility] ORS;  Service: Orthopedics;  Laterality: Left;  . ORIF ELBOW FRACTURE Right 01/01/2017   Procedure: OPEN REDUCTION INTERNAL FIXATION (ORIF) ELBOW/OLECRANON FRACTURE;  Surgeon: Hessie Knows, MD;  Location: [facility] ORS;  Service: Orthopedics;  Laterality: Right;  . ORIF WRIST FRACTURE Right 03/19/2015   Procedure: OPEN REDUCTION INTERNAL FIXATION (ORIF) WRIST FRACTURE;  Surgeon: Hessie Knows, MD;  Location: [facility] ORS;  Service: Orthopedics;  Laterality: Right;  . SHOULDER ARTHROSCOPY Right   . THORACIC AORTA - CAROTID ANGIOGRAM  October 2007   Dr. 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 TRANSTHORACIC ECHOCARDIOGRAM  February 2016   [facility]: Normal LV function. Dilated left atrium.  Domingo Dimes ECHOCARDIOGRAM  04/2019   [facility] April 20, 2019): EF 55-60 %.  Mild LVH.  Relatively normal valves.    Prior to Admission medications   Medication Sig Start Date End Date Taking? Authorizing Provider  acetaminophen (TYLENOL) 500 MG tablet Take 500-1,000 mg by mouth every 6 (six) hours as needed for mild pain or moderate pain.     [provider]  ELIQUIS 5 MG TABS tablet TAKE ONE TABLET TWICE DAILY 09/03/20   Leonie Man, MD  ezetimibe (ZETIA) 10 MG tablet TAKE 1 TABLET BY MOUTH DAILY 12/21/20   [name], MD  finasteride (PROSCAR) 5 MG tablet TAKE ONE TABLET BY MOUTH EVERY DAY 12/21/20   Stoioff, Ronda Fairly, MD  fluticasone (FLONASE) 50 MCG/ACT nasal spray PLACE TWO SPRAYS IN BOTH NOSRTILS EVERY DAY AS NEEDED FOR ALLERGIES 08/07/20    [name], MD  lisinopril (ZESTRIL) 2.5 MG tablet TAKE ONE TABLET BY MOUTH TWICE DAILY 10/05/20   [name], MD  Magnesium Oxide 500 MG TABS Take 500 mg by mouth every evening.    [provider]  melatonin 3 MG TABS tablet Take 0.5 tablets (1.5 mg total) by mouth at bedtime. 05/28/20   [name], MD  metoprolol succinate (TOPROL-XL) 25 MG 24 hr tablet Take 1 tablet (25 mg total) by mouth 2 (two) times daily. Take with or immediately following a meal. Patient taking differently: Take 12.5 mg by mouth 2 (two) times daily. Take with or immediately following a meal. 10/23/20   [name], MD  metoprolol succinate (TOPROL-XL) 50 MG 24 hr tablet TAKE ONE TABLET BY MOUTH EVERY DAY TAKE WITH OR IMMEDIATELY FOLLOWING A MEAL 12/21/20   [name], MD  Multiple Vitamins-Minerals (PRESERVISION AREDS 2 PO) Take 1 tablet by mouth 2 (two) times daily.     [provider]  MYRBETRIQ 50 MG TB24 tablet TAKE ONE TABLET BY MOUTH EVERY DAY 12/21/20   Stoioff, Ronda Fairly, MD  pantoprazole (PROTONIX) 40 MG tablet TAKE ONE TABLET BY MOUTH EVERY DAY 11/02/20   [name], MD  QUEtiapine (SEROQUEL) 25 MG tablet Take 12.5 mg by mouth.     [provider]  rosuvastatin (CRESTOR) 20 MG tablet TAKE ONE TABLET BY MOUTH EVERY DAY 12/21/20   [name], MD  umeclidinium-vilanterol (ANORO ELLIPTA) 62.5-25 MCG/INH AEPB Inhale 1 puff into the lungs daily.  10/25/19   [provider]    Allergies Clonazepam, Codeine, Ropinirole, Hydrocodone, Niacin and related, Oxycodone, and Tramadol  Family History  Problem Relation Age of Onset  . Ulcers Mother        Peptic  . Dementia Mother   . Alcohol abuse Father     Social History Social History   Tobacco Use  . Smoking status: Former Smoker    Types: Cigarettes    Quit date: 08/04/1997    Years since  quitting: 23.4  . Smokeless tobacco: Never Used  Vaping Use  . Vaping Use: Never used  Substance Use Topics   . Alcohol use: No  . Drug use: No    Review of Systems  Review of Systems  Unable to perform ROS: Dementia  Musculoskeletal: Positive for arthralgias.     ____________________________________________  PHYSICAL EXAM:      VITAL SIGNS: ED Triage Vitals  Enc Vitals Group     BP 01/24/21 2028 115/73     Pulse Rate 01/24/21 2028 86     Resp 01/24/21 2028 14     Temp 01/24/21 2028 98 F (36.7 C)     Temp Source 01/24/21 2028 Oral     SpO2 01/24/21 2026 95 %     Weight 01/24/21 2028 151 lb 14.4 oz (68.9 kg)     Height 01/24/21 2028 6' (1.829 m)     Head Circumference --      Peak Flow --      Pain Score 01/24/21 2028 8     Pain Loc --      Pain Edu? --      Excl. in [place]? --      Physical Exam Vitals and nursing note reviewed.  Constitutional:      General: He is not in acute distress.    Appearance: He is well-developed.  HENT:     Head: Normocephalic and atraumatic.  Eyes:     Conjunctiva/sclera: Conjunctivae normal.  Cardiovascular:     Rate and Rhythm: Normal rate and regular rhythm.     Heart sounds: Normal heart sounds.  Pulmonary:     Effort: Pulmonary effort is normal. No respiratory distress.     Breath sounds: No wheezing.  Abdominal:     General: There is no distension.  Musculoskeletal:     Cervical back: Neck supple.     Comments: moderate tenderness to palpation over the left lateral hip.  Pain with logroll.  Skin:    General: Skin is warm.     Capillary Refill: Capillary refill takes less than 2 seconds.     Findings: No rash.  Neurological:     Mental Status: He is alert and oriented to person, place, and time.     Motor: No abnormal muscle tone.      LOWER EXTREMITY EXAM: LEFT  INSPECTION & PALPATION: Deformity to left wrist, with marked tenderness to palpation.  Superficial skin tear versus laceration along the thenar eminence that does not extend to the site of fracture.  SENSORY: sensation is intact to light touch in:  Superficial  peroneal nerve distribution (over dorsum of foot) Deep peroneal nerve distribution (over first dorsal web space) Sural nerve distribution (over lateral aspect 5th metatarsal) Saphenous nerve distribution (over medial instep)  MOTOR:  + Motor EHL (great toe dorsiflexion) + FHL (great toe plantar flexion)  + TA (ankle dorsiflexion)  + GSC (ankle plantar flexion)  VASCULAR: 2+ dorsalis pedis and posterior tibialis pulses Capillary refill < 2 sec, toes warm and well-perfused  COMPARTMENTS: Soft, warm, well-perfused No pain with passive extension No parethesias   ____________________________________________   LABS (all labs ordered are listed, but only abnormal results are displayed)  Labs Reviewed  CBC WITH DIFFERENTIAL/PLATELET - Abnormal; Notable for the following components:      Result Value   RBC 3.57 (*)    Hemoglobin 11.3 (*)    HCT 33.9 (*)    All other components within normal limits  BASIC METABOLIC PANEL -  Abnormal; Notable for the following components:   Glucose, Bld 107 (*)    All other components within normal limits  RESP PANEL BY RT-PCR (FLU A&B, COVID) ARPGX2  TYPE AND SCREEN    ____________________________________________  EKG: Normal sinus rhythm with PACs.  Ventricular rate 86, QRS 116, QTc 45.  No acute ST elevations or depressions. ________________________________________  RADIOLOGY All imaging, including plain films, CT scans, and ultrasounds, independently reviewed by me, and interpretations confirmed via formal radiology reads.  ED MD interpretation:   Chest x-ray: No acute disease X-ray wrist left: Displaced fracture along the proximal margin of the prior fixation plate. X-ray left hip: Nondisplaced fracture of right inferior pubic ramus, mildly displaced intertrochanteric left hip fracture  Official radiology report(s): DG Chest 1 View  Result Date: 01/24/2021 CLINICAL DATA:  Left hip and wrist fractures.  Pre-op clearance exam EXAM:  CHEST  1 VIEW COMPARISON:  04/04/2020 FINDINGS: The heart size and mediastinal contours are within normal limits. Prior CABG again noted. Small calcified granuloma again seen in the right upper lobe. Both lungs are otherwise clear. The visualized skeletal structures are unremarkable. IMPRESSION: No active cardiopulmonary disease. Electronically Signed   By: Marlaine Hind M.D.   On: 01/24/2021 21:24   DG Wrist Complete Left  Result Date: 01/24/2021 CLINICAL DATA:  Fall. Left wrist pain and deformity. Initial encounter. EXAM: LEFT WRIST - COMPLETE 3+ VIEW COMPARISON:  None. FINDINGS: Internal fixation plate and screws are seen in the distal radius. A displaced fracture is seen involving the distal radial diaphysis, near the proximal margin of the fixation plate, with moderate dorsal angulation of the distal radius. No other acute fractures are identified. Old fracture deformity is seen involving the distal ulnar diaphysis and ulnar styloid process. Advanced osteoarthritis is seen involving the base of the thumb. Generalized osteopenia also noted. IMPRESSION: Displaced fracture of the distal radial diaphysis, near the proximal margin of the fixation plate, with moderate dorsal angulation. Old fracture deformities of distal ulnar diaphysis and ulnar styloid process. Electronically Signed   By: Marlaine Hind M.D.   On: 01/24/2021 21:18   CT Head Wo Contrast  Result Date: 01/24/2021 CLINICAL DATA:  Fall. EXAM: CT HEAD WITHOUT CONTRAST CT CERVICAL SPINE WITHOUT CONTRAST TECHNIQUE: Multidetector CT imaging of the head and cervical spine was performed following the standard protocol without intravenous contrast. Multiplanar CT image reconstructions of the cervical spine were also generated. COMPARISON:  11/16/2020 FINDINGS: CT HEAD FINDINGS Brain: There is no evidence for acute hemorrhage, hydrocephalus, mass lesion, or abnormal extra-axial fluid collection. No definite CT evidence for acute infarction. Diffuse loss of  parenchymal volume is consistent with atrophy. Patchy low attenuation in the deep hemispheric and periventricular white matter is nonspecific, but likely reflects chronic microvascular ischemic demyelination. Vascular: No hyperdense vessel or unexpected calcification. Skull: No evidence for fracture. No worrisome lytic or sclerotic lesion. Sinuses/Orbits: The visualized paranasal sinuses and mastoid air cells are clear. Visualized portions of the globes and intraorbital fat are unremarkable. Other: None. CT CERVICAL SPINE FINDINGS Alignment: Normal. Skull base and vertebrae: No acute fracture. No primary bone lesion or focal pathologic process. Soft tissues and spinal canal: No prevertebral fluid or swelling. No visible canal hematoma. Disc levels: Diffuse loss of disc height with endplate degeneration noted in the cervical spine. Scattered facet osteoarthritis noted bilaterally. Upper chest: Biapical pleuroparenchymal scarring evident. Other: None. IMPRESSION: 1. No acute intracranial abnormality. Atrophy with chronic small vessel white matter ischemic disease. 2. Degenerative changes in the cervical spine  without fracture. Electronically Signed   By: Misty Stanley M.D.   On: 01/24/2021 21:44   CT Cervical Spine Wo Contrast  Result Date: 01/24/2021 CLINICAL DATA:  Fall. EXAM: CT HEAD WITHOUT CONTRAST CT CERVICAL SPINE WITHOUT CONTRAST TECHNIQUE: Multidetector CT imaging of the head and cervical spine was performed following the standard protocol without intravenous contrast. Multiplanar CT image reconstructions of the cervical spine were also generated. COMPARISON:  11/16/2020 FINDINGS: CT HEAD FINDINGS Brain: There is no evidence for acute hemorrhage, hydrocephalus, mass lesion, or abnormal extra-axial fluid collection. No definite CT evidence for acute infarction. Diffuse loss of parenchymal volume is consistent with atrophy. Patchy low attenuation in the deep hemispheric and periventricular white matter is  nonspecific, but likely reflects chronic microvascular ischemic demyelination. Vascular: No hyperdense vessel or unexpected calcification. Skull: No evidence for fracture. No worrisome lytic or sclerotic lesion. Sinuses/Orbits: The visualized paranasal sinuses and mastoid air cells are clear. Visualized portions of the globes and intraorbital fat are unremarkable. Other: None. CT CERVICAL SPINE FINDINGS Alignment: Normal. Skull base and vertebrae: No acute fracture. No primary bone lesion or focal pathologic process. Soft tissues and spinal canal: No prevertebral fluid or swelling. No visible canal hematoma. Disc levels: Diffuse loss of disc height with endplate degeneration noted in the cervical spine. Scattered facet osteoarthritis noted bilaterally. Upper chest: Biapical pleuroparenchymal scarring evident. Other: None. IMPRESSION: 1. No acute intracranial abnormality. Atrophy with chronic small vessel white matter ischemic disease. 2. Degenerative changes in the cervical spine without fracture. Electronically Signed   By: Misty Stanley M.D.   On: 01/24/2021 21:44   CT Lumbar Spine Wo Contrast  Result Date: 01/24/2021 CLINICAL DATA:  Initial evaluation for acute low back pain, recent fall. EXAM: CT LUMBAR SPINE WITHOUT CONTRAST TECHNIQUE: Multidetector CT imaging of the lumbar spine was performed without intravenous contrast administration. Multiplanar CT image reconstructions were also generated. COMPARISON:  Recent CT from 11/16/2020. FINDINGS: Segmentation: Standard. Lowest well-formed disc space labeled the L5-S1 level. Alignment: Levoscoliosis with apex at L3-4. No significant listhesis. Vertebrae: There is an acute on subacute burst type compression fracture involving the L2 vertebral body with progressive collapse and height loss since previous. Height loss now measures up to 65% with progressive 7 mm bony retropulsion. Multiple additional chronic compression fractures again noted, with sequelae of  prior vertebral augmentation at T12, L3, L4, and L5. These are stable in appearance. Additional chronic fracture of the distal sacrum/coccyx noted (series 7, image 48). No other acute osseous abnormality. No discrete or worrisome osseous lesions. Paraspinal and other soft tissues: Paraspinous edema adjacent to the L2 compression fracture. Chronic fatty atrophy of the posterior paraspinous musculature. Treated intra-abdominal aneurysm with aorto bi-iliac stent in place. Colonic diverticulosis partially visualized. Probable nonobstructive left renal nephrolithiasis. Disc levels: L1-2: Progressive 7 mm bony retropulsion related to the L2 compression fracture with secondary flattening of the ventral thecal sac. Mild facet hypertrophy. Moderate spinal stenosis is now seen at this level. Moderate bilateral L1 foraminal narrowing. L2-3: Mild disc bulge with annular calcification. Mild facet hypertrophy. No spinal stenosis. Mild to moderate bilateral L2 foraminal narrowing. L3-4: Degenerative intervertebral disc space narrowing with diffuse disc bulge, eccentric to the right. Annular calcification with reactive endplate change. Moderate right worse than left facet hypertrophy. Mild canal with mild to moderate right worse than left lateral recess stenosis. Moderate right worse than left L3 foraminal narrowing. L4-5: Degenerative intervertebral disc space narrowing with diffuse disc bulge and disc desiccation. Prominent right-sided reactive endplate spurring. Moderate right  with mild left facet degeneration. No significant spinal stenosis. Moderate right worse than left L4 foraminal narrowing. L5-S1: Degenerative intervertebral disc space narrowing with diffuse disc bulge and reactive endplate spurring. Mild facet hypertrophy. No significant spinal stenosis. Moderate bilateral L5 foraminal narrowing. IMPRESSION: 1. Acute on subacute compression fracture involving the L2 vertebral body with progressive collapse and height loss  since previous, now measuring up to 65% with progressive 7 mm bony retropulsion. Moderate spinal stenosis now seen at this level. 2. Multiple additional chronic compression fractures with sequelae of prior vertebral augmentation at T12, L3, L4, and L5, stable. 3. Additional chronic fracture of the distal sacrum/coccyx. 4. Moderate multilevel degenerative spondylosis with resultant moderate bilateral foraminal narrowing at L1 through L5. Electronically Signed   By: Jeannine Boga M.D.   On: 01/24/2021 22:06   DG Hip Unilat W or Wo Pelvis 2-3 Views Left  Result Date: 01/24/2021 CLINICAL DATA:  Fall.  Left hip pain.  Initial encounter. EXAM: DG HIP (WITH OR WITHOUT PELVIS) 2-3V LEFT COMPARISON:  11/16/2020 FINDINGS: Mildly displaced intertrochanteric left hip fracture seen. No evidence of dislocation. Generalized osteopenia is noted. A nondisplaced fracture seen involving the right inferior pubic ramus. No other pelvic fractures identified. IMPRESSION: Mildly displaced intertrochanteric left hip fracture. Nondisplaced fracture of right inferior pubic ramus. Osteopenia. Electronically Signed   By: Marlaine Hind M.D.   On: 01/24/2021 21:15    ____________________________________________  PROCEDURES   Procedure(s) performed (including Critical Care):  Procedures  ____________________________________________  INITIAL IMPRESSION / MDM / [place] / ED COURSE  As part of my medical decision making, I reviewed the following data within the [place] notes reviewed and incorporated, Old chart reviewed, Notes from prior ED visits, and [place] [employer]rolled [employer]stance [place] E Foster was evaluated in Emergency Department on 01/24/2021 for the symptoms described in the history of present illness. He was evaluated in the context of the global COVID-19 pandemic, which necessitated consideration that the patient might be at risk for infection with the  SARS-CoV-2 virus that causes COVID-19. Institutional protocols and algorithms that pertain to the evaluation of patients at risk for COVID-19 are in a state of rapid change based on information released by regulatory bodies including the CDC and federal and state organizations. These policies and algorithms were followed during the patient's care in the ED.  Some ED evaluations and interventions may be delayed as a result of limited staffing during the pandemic.*     Medical Decision Making: 87 year old male here with mechanical fall.  Imaging shows left distal radius fracture as well as left hip fracture and pubic ramus fracture.  Patient is on Eliquis.  CT head and C-spine showed no acute abnormality.  Patient is otherwise hemodynamically stable and at his mental baseline.  Discussed with Dr. Mack Guise. Wrist splinted in place, will admit for operative repair of hip and reduction under anesthesia of wrist. Distal NVI. Wound was cleaned and dressed with sterile bandage. Tetanus is UTD.  ____________________________________________  FINAL CLINICAL IMPRESSION(S) / ED DIAGNOSES  Final diagnoses:  Closed fracture of distal end of left radius, unspecified fracture morphology, initial encounter  Closed fracture of left hip, initial encounter ([place])  Closed fracture of ramus of right pubis, initial encounter ([place])     MEDICATIONS GIVEN DURING THIS VISIT:  Medications  bupivacaine (MARCAINE) 0.5 % injection 10 mL (has no administration in time range)  lidocaine (XYLOCAINE) 1 % (with pres) injection 10 mL (  has no administration in time range)  lidocaine (PF) (XYLOCAINE) 1 % injection (has no administration in time range)  fentaNYL (SUBLIMAZE) injection 25 mcg (has no administration in time range)  acetaminophen (TYLENOL) tablet 650 mg (has no administration in time range)  HYDROcodone-acetaminophen (NORCO/VICODIN) 5-325 MG per tablet 1-2 tablet (has no administration in time range)  morphine 2 MG/ML  injection 0.5 mg (has no administration in time range)  enoxaparin (LOVENOX) injection 40 mg (has no administration in time range)  zolpidem (AMBIEN) tablet 5 mg (has no administration in time range)  senna-docusate (Senokot-S) tablet 1 tablet (has no administration in time range)  bisacodyl (DULCOLAX) EC tablet 5 mg (has no administration in time range)     ED Discharge Orders    None       Note:  This document was prepared using Dragon voice recognition software and may include unintentional dictation errors.   Duffy Bruce, MD 01/24/21 2351

## 2021-01-25 ENCOUNTER — Encounter: Payer: Self-pay | Admitting: Internal Medicine

## 2021-01-25 ENCOUNTER — Other Ambulatory Visit: Payer: Self-pay

## 2021-01-25 DIAGNOSIS — S72002A Fracture of unspecified part of neck of left femur, initial encounter for closed fracture: Secondary | ICD-10-CM | POA: Diagnosis not present

## 2021-01-25 DIAGNOSIS — E43 Unspecified severe protein-calorie malnutrition: Secondary | ICD-10-CM | POA: Insufficient documentation

## 2021-01-25 LAB — HEPARIN LEVEL (UNFRACTIONATED): Heparin Unfractionated: >1.1 IU/mL — ABNORMAL HIGH (ref 0.30–0.70)

## 2021-01-25 LAB — APTT: aPTT: 43 seconds — ABNORMAL HIGH (ref 24–36)

## 2021-01-25 LAB — PROTIME-INR
INR: 1.6 — ABNORMAL HIGH (ref 0.8–1.2)
Prothrombin Time: 19.1 seconds — ABNORMAL HIGH (ref 11.4–15.2)

## 2021-01-25 LAB — RESP PANEL BY RT-PCR (FLU A&B, COVID) ARPGX2
Influenza A by PCR: NEGATIVE
Influenza B by PCR: NEGATIVE
SARS Coronavirus 2 by RT PCR: NEGATIVE

## 2021-01-25 MED ORDER — ADULT MULTIVITAMIN W/MINERALS CH
1.0000 | ORAL_TABLET | Freq: Every day | ORAL | Status: DC
  Administered 2021-01-25 – 2021-02-04 (×8): 1 via ORAL
  Filled 2021-01-25 (×8): qty 1

## 2021-01-25 MED ORDER — ENSURE ENLIVE PO LIQD
237.0000 mL | Freq: Two times a day (BID) | ORAL | Status: DC
  Administered 2021-01-25 – 2021-01-31 (×7): 237 mL via ORAL

## 2021-01-25 MED ORDER — CEFAZOLIN SODIUM-DEXTROSE 2-4 GM/100ML-% IV SOLN
2.0000 g | INTRAVENOUS | Status: AC
  Administered 2021-01-29: 2 g via INTRAVENOUS

## 2021-01-25 MED ORDER — METOPROLOL TARTRATE 25 MG PO TABS
25.0000 mg | ORAL_TABLET | Freq: Two times a day (BID) | ORAL | Status: DC
  Administered 2021-01-27 – 2021-02-04 (×10): 25 mg via ORAL
  Filled 2021-01-25 (×14): qty 1

## 2021-01-25 MED ORDER — HEPARIN (PORCINE) 25000 UT/250ML-% IV SOLN
1000.0000 [IU]/h | INTRAVENOUS | Status: DC
  Administered 2021-01-25: 1000 [IU]/h via INTRAVENOUS
  Filled 2021-01-25: qty 250

## 2021-01-25 MED ORDER — HEPARIN BOLUS VIA INFUSION
3500.0000 [IU] | Freq: Once | INTRAVENOUS | Status: AC
  Administered 2021-01-25: 3500 [IU] via INTRAVENOUS
  Filled 2021-01-25: qty 3500

## 2021-01-25 NOTE — Consult Note (Signed)
ORTHOPAEDIC CONSULTATION  REQUESTING PHYSICIAN: Athena Masse, MD  Chief Complaint: Left wrist and upper extremity pain status post fall  HPI: [name] is a 87 y.o. male with Lewy body dementia resident of the memory care unit who fell yesterday.  Patient was brought to the [facility] emergency department where he was diagnosed with a left intertrochanteric hip fracture and a fracture to the left radius just proximal to the previously placed volar plate.  Patient is on Eliquis for paroxysmal atrial fibrillation.  Patient admitted to the hospital service and orthopedics is consulted for management of his fractures.  Patient's son provides history today at the patient's bedside.  The patient is unable to provide an accurate history given his dementia.  Past Medical History:  Diagnosis Date  . AAA (abdominal aortic aneurysm) ([place]) 05/2019   Relativley Rapid progression from June-Aug 2020 (4.6 - 5.4 cm): s/p EVAR (Dr. Lucky Cowboy [facility]): EVAR - 23 mm prox, 12 cm distal & x 18 cm length Gore Excluder Endoprosthesis Main body (via RFA distal to lowest Renal A); 23m x 14 cm L Contralateral Limb for L Iliac - extended with 8 mm x 6cm LifeStar stent (post-dilated with 7 mm DEB).  Additional R Iliac PTA not required.   . 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 ([place] 09/15/2017   09-15-2017 DIAGNOSIS:  A. COLON POLYP X 2, ASCENDING; COLD SNARE:  - TUBULAR ADENOMA.  - NEGATIVE FOR HIGH-GRADE DYSPLASIA AND MALIGNANCY.  - NO ADDITIONAL TISSUE IDENTIFIED; FECAL MATERIAL PRESENT.   B. COLON POLYP X 2, DESCENDING; COLD SNARE:  - ADENOCARCINOMA ARISING IN A DYSPLASTIC SESSILE SERRATED ADENOMA, WITH  LYMPHOVASCULAR INVASION, SEE COMMENT.  - TUBULAR ADENOMA, 2 FRAGMENTS, NEGATIVE FO  . Arthritis   . Basal cell carcinoma of eyelid 01/03/2013   2019 - R side of Nose  . Benign neoplasm of ascending colon   . Benign neoplasm of descending colon   . Bilateral iliac artery stenosis ([place] 2007   Bilateral Iliac A stenting;  extension of EVAR limbs into both Iliacs -- additional stent placed in L Iliac.  . Cancer ([place]    Skin cancer  . Colon polyps   . Complication of anesthesia    severe confusion agitation requiring hospital admission x 4 days  . Compression fracture of fourth lumbar vertebra ([place] 04/2019   - s/p Kyphoplasty; T12, L3-5 ([facility]  . COPD (chronic obstructive pulmonary disease) (HCC)    Centrilobular Emphysema  . Coronary artery disease involving native coronary artery without angina pectoris 1998   - s/p CABG, occluded SVG-D1; patent LIMA-LAD, SVG-OM, SVG- RCA  . Dementia ([place]    memory loss - started noting late 2019  . Falls frequently    broke foot  . Fracture 2021   left foot  . GERD (gastroesophageal reflux disease)   . Hemorrhoid 02/10/2015  . History of hiatal hernia   . History of kidney stones   . HOH (hard of hearing)   . Hypertension   . Lewy body dementia ([place] 04/04/2020  . Macular degeneration disease   . Osteoporosis   . PAF (paroxysmal atrial fibrillation) ([place]    on Eliquis for OAC - Rate Control   . Prostate cancer ([place]   . Prostate disorder   . Sciatic pain    Chronic  . Sleep apnea    cpap  . Temporary cerebral vascular dysfunction 02/10/2015   Had negative work-up.  July, 2012.  No medication changes, had forgot them that day, got dehydrated.   .[name]  Urinary incontinence    Past Surgical History:  Procedure Laterality Date  . ABDOMINAL AORTIC ENDOVASCULAR STENT GRAFT Bilateral 05/18/2019   Procedure: ABDOMINAL AORTIC ENDOVASCULAR STENT GRAFT;  Surgeon: Algernon Huxley, MD;  Location: [facility] ORS;  Service: Vascular;  Laterality: Bilateral;  . ANKLE SURGERY Left    ORIF   . Arch aortogram and carotid aortogram  06/02/2006   Dr Oneida Alar did surgery  . BASAL CELL CARCINOMA EXCISION  04/2016   Dermatology  . CARDIAC CATHETERIZATION  01/30/1998    (Dr. Linard Millers): Native LAD & mRCA CTO. 90% D1. Mod Cx. SVG-D1 CTO. SVG-RCA & SVG-OM along with LIMA-LAD patent;  LV  NORMAL Fxn  . CAROTID ENDARTERECTOMY Left Oct. 29, 2007   Dr. Oneida Alar:  . CATARACT EXTRACTION W/PHACO Right 03/05/2017   Procedure: CATARACT EXTRACTION PHACO AND INTRAOCULAR LENS PLACEMENT (IOC);  Surgeon: Leandrew Koyanagi, MD;  Location: [facility] ORS;  Service: Ophthalmology;  Laterality: Right;  [place] 00:58.5 AP% 10.6 CDE 6.20 Fluid Pack lot # [id number]  . CATARACT EXTRACTION W/PHACO Left 04/15/2017   Procedure: CATARACT EXTRACTION PHACO AND INTRAOCULAR LENS PLACEMENT ([place])  Left;  Surgeon: Leandrew Koyanagi, MD;  Location: [place];  Service: Ophthalmology;  Laterality: Left;  . COLONOSCOPY WITH PROPOFOL N/A 09/15/2017   Procedure: COLONOSCOPY WITH PROPOFOL;  Surgeon: Lucilla Lame, MD;  Location: [facility] ENDOSCOPY;  Service: Endoscopy;  Laterality: N/A;  . COLONOSCOPY WITH PROPOFOL N/A 10/26/2018   Procedure: COLONOSCOPY WITH PROPOFOL;  Surgeon: Lucilla Lame, MD;  Location: [facility] ENDOSCOPY;  Service: Endoscopy;  Laterality: N/A;  . CORONARY ARTERY BYPASS GRAFT  1998   LIMA-LAD, SVG-OM, SVG-RPDA, SVG-DIAG  . CPET / MET  12/2012   Mild chronotropic incompetence - read 82% of predicted; also reduced effort; peak VO2 15.7 / 75% (did not reach Max effort) -- suggested ischemic response in last 1.5 minutes of exercise. Normal pulmonary function on PFTs but poor response to  . CYSTOSCOPY W/ RETROGRADES Left 01/13/2020   Procedure: CYSTOSCOPY WITH RETROGRADE PYELOGRAM;  Surgeon: Billey Co, MD;  Location: [facility] ORS;  Service: Urology;  Laterality: Left;  . CYSTOSCOPY WITH STENT PLACEMENT Left 12/26/2019   Procedure: CYSTOSCOPY WITH STENT PLACEMENT;  Surgeon: Billey Co, MD;  Location: [facility] ORS;  Service: Urology;  Laterality: Left;  . CYSTOSCOPY/URETEROSCOPY/HOLMIUM LASER/STENT PLACEMENT Left 01/13/2020   Procedure: CYSTOSCOPY/URETEROSCOPY/HOLMIUM LASER/STENT EXCHANGE;  Surgeon: Billey Co, MD;  Location: [facility] ORS;  Service: Urology;  Laterality: Left;  . ENDOVASCULAR REPAIR/STENT  GRAFT N/A 05/18/2019   Procedure: ENDOVASCULAR REPAIR/STENT GRAFT;  Surgeon: Algernon Huxley, MD;  Location: [facility] INVASIVE CV LAB;; EVAR - 23 mm prox, 12 cm distal & x 18 cm length Gore Excluder Endoprosthesis Main body (via RFA distal to lowest Renal A); 67m x 14 cm L Contralateral Limb for L Iliac - extended with 8 mm x 6cm LifeStar stent; no additional R Iliac PTA needed.   . ESOPHAGOGASTRODUODENOSCOPY (EGD) WITH PROPOFOL N/A 09/15/2017   Procedure: ESOPHAGOGASTRODUODENOSCOPY (EGD) WITH PROPOFOL;  Surgeon: WLucilla Lame MD;  [facility];  Service: Endoscopy;  Laterality: N/A;  . FRACTURE SURGERY Right 03/19/2015   wrist  . FRACTURE SURGERY Left   . HIATAL HERNIA REPAIR    . ILIAC ARTERY STENT  12/15/2005   PTA and direct stenting rgt and lft common iliac arteries  . KYPHOPLASTY N/A 04/05/2019   Procedure: KYPHOPLASTY T12, L3, L4 L5;  Surgeon: MHessie Knows MD;  Location: [facility] ORS;  Service: Orthopedics;  Laterality: N/A;  . [place] GATED [place]  H[place])  05/'19; 8/'20   a) [facility]: Low Risk - no ischemia or infarct.  EF > 65%. no RWMA;; b) [facility]): Normal wall motion.  No ischemia or infarction.  . OPEN REDUCTION INTERNAL FIXATION (ORIF) DISTAL RADIAL FRACTURE Left 01/13/2019   Procedure: OPEN REDUCTION INTERNAL FIXATION (ORIF) DISTAL  RADIAL FRACTURE - LEFT - SLEEP APNEA;  Surgeon: Hessie Knows, MD;  Location: [facility] ORS;  Service: Orthopedics;  Laterality: Left;  . ORIF ELBOW FRACTURE Right 01/01/2017   Procedure: OPEN REDUCTION INTERNAL FIXATION (ORIF) ELBOW/OLECRANON FRACTURE;  Surgeon: Hessie Knows, MD;  Location: [facility] ORS;  Service: Orthopedics;  Laterality: Right;  . ORIF WRIST FRACTURE Right 03/19/2015   Procedure: OPEN REDUCTION INTERNAL FIXATION (ORIF) WRIST FRACTURE;  Surgeon: Hessie Knows, MD;  Location: [facility] ORS;  Service: Orthopedics;  Laterality: Right;  . SHOULDER ARTHROSCOPY Right   . THORACIC AORTA - CAROTID ANGIOGRAM  October 2007   Dr. 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 TRANSTHORACIC ECHOCARDIOGRAM  February 2016   [facility]: Normal LV function. Dilated left atrium.  Domingo Dimes ECHOCARDIOGRAM  04/2019   [facility] April 20, 2019): EF 55-60 %.  Mild LVH.  Relatively normal valves.   Social History   Socioeconomic History  . Marital status: Widowed    Spouse name: Not on file  . Number of children: 1  . Years of education: Not on file  . Highest education level: Some college, no degree  Occupational History  . Occupation: retired  Tobacco Use  . Smoking status: Former Smoker    Types: Cigarettes    Quit date: 08/04/1997    Years since quitting: 23.4  . Smokeless tobacco: Never Used  Vaping Use  . Vaping Use: Never used  Substance and Sexual Activity  . Alcohol use: No  . Drug use: No  . Sexual activity: Not Currently    Birth control/protection: None  Other Topics Concern  . Not on file  Social History Narrative   Resides at [facility] since 06/2018   He is a widowed father of one, grandfather of 33 with 3 stepchildren.    Now accompanied by his long-term significant other who lives just a few minutes away. .    She accompanies him with just not any clinic visit or hospitalization.  She also helps make sure he is taking his medication.   He has become more debilitated since his EVAR last September and has had more gait instability with balance issues.   He is not nearly as active as he used to be.  Really got deconditioned and has taken a long time to recover.   Currently undergoing physical therapy, Occupational Therapy treatments.      He has changed his CODE STATUS to DNR.   Social Determinants of Health   Financial Resource Strain: Low Risk   . Difficulty of Paying Living Expenses: Not hard at all  Food Insecurity: No Food Insecurity  . Worried About Charity fundraiser in the Last Year: Never true  . Ran Out of Food in the Last  Year: Never true  Transportation Needs: No Transportation Needs  . Lack of Transportation (Medical): No  . Lack of Transportation (Non-Medical): No  Physical Activity: Inactive  . Days of Exercise per Week: 0 days  . Minutes of Exercise per Session: 0 min  Stress: No Stress Concern Present  . Feeling of Stress : Only a little  Social Connections:  Socially Isolated  . Frequency of Communication with Friends and Family: More than three times a week  . Frequency of Social Gatherings with Friends and Family: More than three times a week  . Attends Religious Services: Never  . Active Member of Clubs or Organizations: No  . Attends Archivist Meetings: Never  . Marital Status: Widowed   Family History  Problem Relation Age of Onset  . Ulcers Mother        Peptic  . Dementia Mother   . Alcohol abuse Father    Allergies  Allergen Reactions  . Clonazepam Other (See Comments)    Altered mental status  . Codeine Other (See Comments)    Altered mental status.  . Ropinirole Swelling    Other reaction(s): Hallucination  . Hydrocodone     confusion  . Niacin And Related Other (See Comments)    Flushing of skin  . Oxycodone Other (See Comments)    confusion confusion  . Tramadol     Other reaction(s): Hallucination   Prior to Admission medications   Medication Sig Start Date End Date Taking? Authorizing Provider  acetaminophen (TYLENOL) 500 MG tablet Take 500-1,000 mg by mouth every 6 (six) hours as needed for mild pain or moderate pain.   Yes [provider]  bacitracin ([employer] BACITRACIN) ointment Apply 1 application topically every evening. Apply to right eyelid topically for 4 weeks in the evening after instillation of Systane eye drops.   Yes [provider]  Cholecalciferol 50 MCG (2000 UT) TBDP Take 2,000 Units by mouth daily.   Yes [provider]  donepezil (ARICEPT) 10 MG tablet Take 10 mg by mouth at bedtime. 12/26/20  Yes [provider]  ELIQUIS 5 MG TABS tablet TAKE ONE TABLET TWICE DAILY 09/03/20  Yes Leonie Man, MD  ezetimibe (ZETIA) 10 MG tablet TAKE 1 TABLET BY MOUTH DAILY 12/21/20  Yes [name], MD  finasteride (PROSCAR) 5 MG tablet TAKE ONE TABLET BY MOUTH EVERY DAY 12/21/20  Yes Stoioff, Ronda Fairly, MD  fluticasone (FLONASE) 50 MCG/ACT nasal spray PLACE TWO SPRAYS IN BOTH NOSRTILS EVERY DAY AS NEEDED FOR ALLERGIES Patient taking differently: Place 2 sprays into both nostrils daily. 08/07/20  Yes [name], MD  fluticasone-salmeterol [facility] INHUB) 250-50 MCG/ACT AEPB Inhale 1 puff into the lungs in the morning and at bedtime.   Yes [provider]  lisinopril (ZESTRIL) 2.5 MG tablet TAKE ONE TABLET BY MOUTH TWICE DAILY 10/05/20  Yes [name], MD  LORazepam (ATIVAN) 0.5 MG tablet Take 0.5 mg by mouth every 8 (eight) hours as needed for anxiety or agitation. 12/26/20  Yes [provider]  Magnesium Oxide 500 MG TABS Take 500 mg by mouth every evening.   Yes [provider]  metoprolol tartrate (LOPRESSOR) 25 MG tablet Take 25 mg by mouth 2 (two) times daily. 03/30/20  Yes [provider]  mirtazapine (REMERON) 7.5 MG tablet Take 7.5 mg by mouth at bedtime. 12/26/20  Yes [provider]  Multiple Vitamins-Minerals (PRESERVISION AREDS 2 PO) Take 1 tablet by mouth 2 (two) times daily.    Yes [provider]  MYRBETRIQ 50 MG TB24 tablet TAKE ONE TABLET BY MOUTH EVERY DAY 12/21/20  Yes Stoioff, Ronda Fairly, MD  Polyethyl Glycol-Propyl Glycol (SYSTANE) 0.4-0.3 % SOLN Place 1 drop into both eyes at bedtime.   Yes [provider]  rosuvastatin (CRESTOR) 20 MG tablet TAKE ONE TABLET BY MOUTH EVERY DAY Patient  taking differently: Take 20 mg by mouth daily. 12/21/20  Yes [name], MD  traZODone (DESYREL) 50 MG tablet Take 50 mg by mouth at bedtime. 12/24/20  Yes [provider]   DG Chest 1 View  Result Date: 01/24/2021 CLINICAL  DATA:  Left hip and wrist fractures.  Pre-op clearance exam EXAM: CHEST  1 VIEW COMPARISON:  04/04/2020 FINDINGS: The heart size and mediastinal contours are within normal limits. Prior CABG again noted. Small calcified granuloma again seen in the right upper lobe. Both lungs are otherwise clear. The visualized skeletal structures are unremarkable. IMPRESSION: No active cardiopulmonary disease. Electronically Signed   By: Marlaine Hind M.D.   On: 01/24/2021 21:24   DG Wrist Complete Left  Result Date: 01/24/2021 CLINICAL DATA:  Fall. Left wrist pain and deformity. Initial encounter. EXAM: LEFT WRIST - COMPLETE 3+ VIEW COMPARISON:  None. FINDINGS: Internal fixation plate and screws are seen in the distal radius. A displaced fracture is seen involving the distal radial diaphysis, near the proximal margin of the fixation plate, with moderate dorsal angulation of the distal radius. No other acute fractures are identified. Old fracture deformity is seen involving the distal ulnar diaphysis and ulnar styloid process. Advanced osteoarthritis is seen involving the base of the thumb. Generalized osteopenia also noted. IMPRESSION: Displaced fracture of the distal radial diaphysis, near the proximal margin of the fixation plate, with moderate dorsal angulation. Old fracture deformities of distal ulnar diaphysis and ulnar styloid process. Electronically Signed   By: Marlaine Hind M.D.   On: 01/24/2021 21:18   CT Head Wo Contrast  Result Date: 01/24/2021 CLINICAL DATA:  Fall. EXAM: CT HEAD WITHOUT CONTRAST CT CERVICAL SPINE WITHOUT CONTRAST TECHNIQUE: Multidetector CT imaging of the head and cervical spine was performed following the standard protocol without intravenous contrast. Multiplanar CT image reconstructions of the cervical spine were also generated. COMPARISON:  11/16/2020 FINDINGS: CT HEAD FINDINGS Brain: There is no evidence for acute hemorrhage, hydrocephalus, mass lesion, or abnormal extra-axial fluid  collection. No definite CT evidence for acute infarction. Diffuse loss of parenchymal volume is consistent with atrophy. Patchy low attenuation in the deep hemispheric and periventricular white matter is nonspecific, but likely reflects chronic microvascular ischemic demyelination. Vascular: No hyperdense vessel or unexpected calcification. Skull: No evidence for fracture. No worrisome lytic or sclerotic lesion. Sinuses/Orbits: The visualized paranasal sinuses and mastoid air cells are clear. Visualized portions of the globes and intraorbital fat are unremarkable. Other: None. CT CERVICAL SPINE FINDINGS Alignment: Normal. Skull base and vertebrae: No acute fracture. No primary bone lesion or focal pathologic process. Soft tissues and spinal canal: No prevertebral fluid or swelling. No visible canal hematoma. Disc levels: Diffuse loss of disc height with endplate degeneration noted in the cervical spine. Scattered facet osteoarthritis noted bilaterally. Upper chest: Biapical pleuroparenchymal scarring evident. Other: None. IMPRESSION: 1. No acute intracranial abnormality. Atrophy with chronic small vessel white matter ischemic disease. 2. Degenerative changes in the cervical spine without fracture. Electronically Signed   By: Misty Stanley M.D.   On: 01/24/2021 21:44   CT Cervical Spine Wo Contrast  Result Date: 01/24/2021 CLINICAL DATA:  Fall. EXAM: CT HEAD WITHOUT CONTRAST CT CERVICAL SPINE WITHOUT CONTRAST TECHNIQUE: Multidetector CT imaging of the head and cervical spine was performed following the standard protocol without intravenous contrast. Multiplanar CT image reconstructions of the cervical spine were also generated. COMPARISON:  11/16/2020 FINDINGS: CT HEAD FINDINGS Brain: There is no evidence for acute hemorrhage, hydrocephalus, mass lesion, or abnormal extra-axial  fluid collection. No definite CT evidence for acute infarction. Diffuse loss of parenchymal volume is consistent with atrophy. Patchy low  attenuation in the deep hemispheric and periventricular white matter is nonspecific, but likely reflects chronic microvascular ischemic demyelination. Vascular: No hyperdense vessel or unexpected calcification. Skull: No evidence for fracture. No worrisome lytic or sclerotic lesion. Sinuses/Orbits: The visualized paranasal sinuses and mastoid air cells are clear. Visualized portions of the globes and intraorbital fat are unremarkable. Other: None. CT CERVICAL SPINE FINDINGS Alignment: Normal. Skull base and vertebrae: No acute fracture. No primary bone lesion or focal pathologic process. Soft tissues and spinal canal: No prevertebral fluid or swelling. No visible canal hematoma. Disc levels: Diffuse loss of disc height with endplate degeneration noted in the cervical spine. Scattered facet osteoarthritis noted bilaterally. Upper chest: Biapical pleuroparenchymal scarring evident. Other: None. IMPRESSION: 1. No acute intracranial abnormality. Atrophy with chronic small vessel white matter ischemic disease. 2. Degenerative changes in the cervical spine without fracture. Electronically Signed   By: Misty Stanley M.D.   On: 01/24/2021 21:44   CT Lumbar Spine Wo Contrast  Result Date: 01/24/2021 CLINICAL DATA:  Initial evaluation for acute low back pain, recent fall. EXAM: CT LUMBAR SPINE WITHOUT CONTRAST TECHNIQUE: Multidetector CT imaging of the lumbar spine was performed without intravenous contrast administration. Multiplanar CT image reconstructions were also generated. COMPARISON:  Recent CT from 11/16/2020. FINDINGS: Segmentation: Standard. Lowest well-formed disc space labeled the L5-S1 level. Alignment: Levoscoliosis with apex at L3-4. No significant listhesis. Vertebrae: There is an acute on subacute burst type compression fracture involving the L2 vertebral body with progressive collapse and height loss since previous. Height loss now measures up to 65% with progressive 7 mm bony retropulsion. Multiple  additional chronic compression fractures again noted, with sequelae of prior vertebral augmentation at T12, L3, L4, and L5. These are stable in appearance. Additional chronic fracture of the distal sacrum/coccyx noted (series 7, image 48). No other acute osseous abnormality. No discrete or worrisome osseous lesions. Paraspinal and other soft tissues: Paraspinous edema adjacent to the L2 compression fracture. Chronic fatty atrophy of the posterior paraspinous musculature. Treated intra-abdominal aneurysm with aorto bi-iliac stent in place. Colonic diverticulosis partially visualized. Probable nonobstructive left renal nephrolithiasis. Disc levels: L1-2: Progressive 7 mm bony retropulsion related to the L2 compression fracture with secondary flattening of the ventral thecal sac. Mild facet hypertrophy. Moderate spinal stenosis is now seen at this level. Moderate bilateral L1 foraminal narrowing. L2-3: Mild disc bulge with annular calcification. Mild facet hypertrophy. No spinal stenosis. Mild to moderate bilateral L2 foraminal narrowing. L3-4: Degenerative intervertebral disc space narrowing with diffuse disc bulge, eccentric to the right. Annular calcification with reactive endplate change. Moderate right worse than left facet hypertrophy. Mild canal with mild to moderate right worse than left lateral recess stenosis. Moderate right worse than left L3 foraminal narrowing. L4-5: Degenerative intervertebral disc space narrowing with diffuse disc bulge and disc desiccation. Prominent right-sided reactive endplate spurring. Moderate right with mild left facet degeneration. No significant spinal stenosis. Moderate right worse than left L4 foraminal narrowing. L5-S1: Degenerative intervertebral disc space narrowing with diffuse disc bulge and reactive endplate spurring. Mild facet hypertrophy. No significant spinal stenosis. Moderate bilateral L5 foraminal narrowing. IMPRESSION: 1. Acute on subacute compression fracture  involving the L2 vertebral body with progressive collapse and height loss since previous, now measuring up to 65% with progressive 7 mm bony retropulsion. Moderate spinal stenosis now seen at this level. 2. Multiple additional chronic compression fractures with sequelae of  prior vertebral augmentation at T12, L3, L4, and L5, stable. 3. Additional chronic fracture of the distal sacrum/coccyx. 4. Moderate multilevel degenerative spondylosis with resultant moderate bilateral foraminal narrowing at L1 through L5. Electronically Signed   By: Jeannine Boga M.D.   On: 01/24/2021 22:06   DG Hip Unilat W or Wo Pelvis 2-3 Views Left  Result Date: 01/24/2021 CLINICAL DATA:  Fall.  Left hip pain.  Initial encounter. EXAM: DG HIP (WITH OR WITHOUT PELVIS) 2-3V LEFT COMPARISON:  11/16/2020 FINDINGS: Mildly displaced intertrochanteric left hip fracture seen. No evidence of dislocation. Generalized osteopenia is noted. A nondisplaced fracture seen involving the right inferior pubic ramus. No other pelvic fractures identified. IMPRESSION: Mildly displaced intertrochanteric left hip fracture. Nondisplaced fracture of right inferior pubic ramus. Osteopenia. Electronically Signed   By: Marlaine Hind M.D.   On: 01/24/2021 21:15    Positive ROS: All other systems have been reviewed and were otherwise negative with the exception of those mentioned in the HPI and as above.  Physical Exam: General: Patient comfortable and sleeping.  Unable to participate with exam.  MUSCULOSKELETAL: Patient skin is intact overlying the left hip.  He has no erythema ecchymosis or significant swelling.  He has palpable pedal pulses distally.  He has shortening and external rotation of the left lower extremity.  Left wrist: Patient has a splint in place.  Patient is reported by the emergency department physician to have a superficial abrasion over the thenar eminence of the left hand but otherwise the skin was intact.  Patient has dorsal  angulation of the left wrist.  Patient's is well-perfused.  Motor and sensory function could not be assessed.  Assessment: 1.  Left displaced radius fracture 2.  Left intertrochanteric hip fracture, displaced  Plan: I reviewed the patient's x-rays.  I explained these results to the patient's son.  I have recommended intramedullary fixation for his left intertrochanteric hip fracture as well as revision plating of his left radius fracture.  Patient is on Eliquis and will require at least 3 days of the medication before fractures to be fixed.  Patient has been to the hospital service we will continue medical care until surgery.  Patient will need preop clearance from the medical service.  I discussed the risks and benefits of surgery with the patient's son.  He understands the risks include but are not limited to infection, bleeding requiring blood transfusion, nerve or blood vessel injury, joint stiffness or loss of motion, persistent pain, weakness or instability, malunion, nonunion and hardware failure and the need for further surgery. Medical risks include but are not limited to DVT and pulmonary embolism, myocardial infarction, stroke, pneumonia, respiratory failure and death. Patient's son understood these risks and wished to proceed.   Patient will be NPO after midnight on Monday night.  No Eliquis is to be given.  Short acting anticoagulation may be used per medicine.   Thornton Park, MD    01/25/2021 3:36 PM

## 2021-01-25 NOTE — Progress Notes (Addendum)
[place] for heparin infusion Indication: atrial fibrillation  Allergies  Allergen Reactions  . Clonazepam Other (See Comments)    Altered mental status  . Codeine Other (See Comments)    Altered mental status.  . Ropinirole Swelling    Other reaction(s): Hallucination  . Hydrocodone     confusion  . Niacin And Related Other (See Comments)    Flushing of skin  . Oxycodone Other (See Comments)    confusion confusion  . Tramadol     Other reaction(s): Hallucination    Patient Measurements: Height: 6' (182.9 cm) Weight: 66.2 kg (145 lb 15.1 oz) (bed weight) IBW/kg (Calculated) : 77.6  Vital Signs: Temp: 97.2 F (36.2 C) (05/27 1302) Temp Source: Axillary (05/27 1302) BP: 105/70 (05/27 1302) Pulse Rate: 94 (05/27 1302)  Labs: Recent Labs    01/24/21 2041  HGB 11.3*  HCT 33.9*  PLT 158  CREATININE 0.87    Estimated Creatinine Clearance: 58.1 mL/min (by C-G formula based on SCr of 0.87 mg/dL).   Medical History: Past Medical History:  Diagnosis Date  . AAA (abdominal aortic aneurysm) ([place]) 05/2019   Relativley Rapid progression from June-Aug 2020 (4.6 - 5.4 cm): s/p EVAR (Dr. Lucky [facility]): EVAR - 23 mm prox, 12 cm distal & x 18 cm length Gore Excluder Endoprosthesis Main body (via RFA distal to lowest Renal A); 18mm x 14 cm L Contralateral Limb for L Iliac - extended with 8 mm x 6cm LifeStar stent (post-dilated with 7 mm DEB).  Additional R Iliac PTA not required.   . 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 ([place]) 09/15/2017   09-15-2017 DIAGNOSIS:  A. COLON POLYP X 2, ASCENDING; COLD SNARE:  - TUBULAR ADENOMA.  - NEGATIVE FOR HIGH-GRADE DYSPLASIA AND MALIGNANCY.  - NO ADDITIONAL TISSUE IDENTIFIED; FECAL MATERIAL PRESENT.   B. COLON POLYP X 2, DESCENDING; COLD SNARE:  - ADENOCARCINOMA ARISING IN A DYSPLASTIC SESSILE SERRATED ADENOMA, WITH  LYMPHOVASCULAR INVASION, SEE COMMENT.  - TUBULAR ADENOMA, 2 FRAGMENTS, NEGATIVE FO  . Arthritis   .  Basal cell carcinoma of eyelid 01/03/2013   2019 - R side of Nose  . Benign neoplasm of ascending colon   . Benign neoplasm of descending colon   . Bilateral iliac artery stenosis ([place]) 2007   Bilateral Iliac A stenting; extension of EVAR limbs into both Iliacs -- additional stent placed in L Iliac.  . Cancer ([place])    Skin cancer  . Colon polyps   . Complication of anesthesia    severe confusion agitation requiring hospital admission x 4 days  . Compression fracture of fourth lumbar vertebra ([place]) 04/2019   - s/p Kyphoplasty; T12, L3-5 [facility])  . COPD (chronic obstructive pulmonary disease) (H[facility])    Centrilobular Emphysema  . Coronary artery disease involving native coronary artery without angina pectoris 1998   - s/p CABG, occluded SVG-D1; patent LIMA-LAD, SVG-OM, SVG- RCA  . Dementia ([place])    memory loss - started noting late 2019  . Falls frequently    broke foot  . Fracture 2021   left foot  . GERD (gastroesophageal reflux disease)   . Hemorrhoid 02/10/2015  . History of hiatal hernia   . History of kidney stones   . HOH (hard of hearing)   . Hypertension   . Lewy body dementia ([place]) 04/04/2020  . Macular degeneration disease   . Osteoporosis   . PAF (paroxysmal atrial fibrillation) ([place])    on Eliquis for OAC - Rate Control   .  Prostate cancer ([place])   . Prostate disorder   . Sciatic pain    Chronic  . Sleep apnea    cpap  . Temporary cerebral vascular dysfunction 02/10/2015   Had negative work-up.  July, 2012.  No medication changes, had forgot them that day, got dehydrated.   . Urinary incontinence     Medications:  PTA Eliquis 5 mg BID - last dose 5/26 at 0900  Assessment: 87 y.o. male with medical history significant for Paroxysmal A. fib on Eliquis, COPD, HTN, CAD with history of CABG, OSA on CPAP and dementia who presents from home following an accidental fall in which she stepped back falling onto his left side with immediate pain in his left hip and left  wrist. Pt planned to undergo intramedullary fixation of left intertrochanteric hip fracture as well as revision plating of left radius fracture 5/31. Pharmacy has been consulted for heparin dosing and monitoring. Pt takes Eliquis PTA - last dose noted to be 5/26 @0900 .  Hgb 11.3, Plt 158; all other baseline labs pending  Goal of Therapy:  Heparin level 0.3-0.7 units/ml once correlating with aPTT  aPTT 66-102 seconds Monitor platelets by anticoagulation protocol: Yes   Plan:   Give 3500 units bolus x 1  Start heparin infusion at 1000 units/hr  Check aPTT level in 8 hours and daily while on heparin.   Check anti-Xa daily until correlating with aPTT then will switch to anti-Xa monitoring  Baseline labs ordered  Continue to monitor H&H and platelets  Sherilyn Banker, PharmD Pharmacy Resident  01/25/2021 4:31 PM

## 2021-01-25 NOTE — Progress Notes (Signed)
[facility]  PROGRESS NOTE  [name] [name] DOB: [birth year] DOA: 01/24/2021 PCP: [name], MD   Brief HPI:   87 year old male with medical history of paroxysmal atrial fibrillation on Eliquis, COPD, hypertension, CAD with history of CABG, OSA on CPAP, dementia presented from home following accidental fall in which she stepped back falling onto his left side with immediate pain in the left hip and left wrist.  He had no loss of consciousness, denies hitting head. Left wrist x-ray showed displaced fracture distal radial diaphysis. Pelvis and left hip x-ray showed mildly displaced intertrochanteric left hip fracture.  Nondisplaced fracture right inferior pubic ramus.    Subjective   Patient seen and examined, denies pain at this time.   Assessment/Plan:     1. Left displaced intertrochanteric hip fracture-orthopedic surgery has been consulted, plan to perform intramedullary fixation on Tuesday.  Patient has been on Eliquis and will require at least 3 days of Eliquis washout before fracture can be fixed.  Management per orthopedics. 2. Paroxysmal atrial fibrillation-patient has history of atrial fibrillation, heart rate is well controlled.  We will continue with metoprolol 25 mg p.o. twice daily.  Eliquis is currently on hold due to above.  Orthopedics is okay with starting short acting anticoagulation.  We will start heparin per pharmacy till Monday midnight.  Patient has surgery scheduled for Tuesday. 3. Hypertension-continue metoprolol, will hold lisinopril as blood pressure is not significantly elevated. 4. OSA on CPAP-continue home CPAP. 5. COPD-no acute exacerbation, continue home inhalers, DuoNebs as needed. 6. Dementia without behavior disturbance-continue Seroquel 7. CAD with history of CABG-no recent complaints of chest pain or shortness of breath.  EKG is unremarkable.  Continue metoprolol. 8. Risk stratification-patient has history of CAD s/p CABG,  EKG showed no acute abnormality.  He does not have chest pain or shortness of breath.  No further testing warranted at this time.  At this time patient would be high risk for surgery considering history of CAD and advanced age.  However benefit of surgery outweighs the risk involved considering advanced age and poor quality of life without hip repair.    Scheduled medications:   . acetaminophen  650 mg Oral Once  . enoxaparin (LOVENOX) injection  40 mg Subcutaneous Q24H  . feeding supplement  237 mL Oral BID BM  . multivitamin with minerals  1 tablet Oral Daily         Data Reviewed:   CBG:  No results for input(s): GLUCAP in the last 168 hours.  SpO2: 95 %    Vitals:   01/25/21 0618 01/25/21 0905 01/25/21 1302 01/25/21 1429  BP: 106/60 119/71 105/70   Pulse: 91 96 94   Resp: 12 17 16    Temp:   (!) 97.2 F (36.2 C)   TempSrc:   Axillary   SpO2: 100% 100% 95%   Weight:    66.2 kg  Height:        No intake or output data in the 24 hours ending 01/25/21 1556  No intake/output data recorded.  Filed Weights   01/24/21 2028 01/25/21 1429  Weight: 68.9 kg 66.2 kg    CBC:  Recent Labs  Lab 01/24/21 2041  WBC 10.2  HGB 11.3*  HCT 33.9*  PLT 158  MCV 95.0  MCH 31.7  MCHC 33.3  RDW 15.5  LYMPHSABS 1.9  MONOABS 0.9  EOSABS 0.2  BASOSABS 0.1    Complete metabolic panel:  Recent Labs  Lab 01/24/21 2041  NA  139  K 4.1  CL 104  CO2 26  GLUCOSE 107*  BUN 16  CREATININE 0.87  CALCIUM 9.0    No results for input(s): LIPASE, AMYLASE in the last 168 hours.  Recent Labs  Lab 01/24/21 0143  SARSCOV2NAA NEGATIVE    ------------------------------------------------------------------------------------------------------------------ No results for input(s): CHOL, HDL, LDLCALC, TRIG, CHOLHDL, LDLDIRECT in the last 72 hours.  No results found for:  HGBA1C ------------------------------------------------------------------------------------------------------------------ No results for input(s): TSH, T4TOTAL, T3FREE, THYROIDAB in the last 72 hours.  Invalid input(s): FREET3 ------------------------------------------------------------------------------------------------------------------ No results for input(s): VITAMINB12, FOLATE, FERRITIN, TIBC, IRON, RETICCTPCT in the last 72 hours.  Coagulation profile No results for input(s): INR, PROTIME in the last 168 hours. No results for input(s): DDIMER in the last 72 hours.  Cardiac Enzymes No results for input(s): CKTOTAL, CKMB, CKMBINDEX, TROPONINI in the last 168 hours.  ------------------------------------------------------------------------------------------------------------------    Component Value Date/Time   BNP 86.0 06/09/2016 1123     Antibiotics: Anti-infectives (From admission, onward)   Start     Dose/Rate Route Frequency Ordered Stop   01/29/21 0000  ceFAZolin (ANCEF) IVPB 2g/100 mL premix        2 g 200 mL/hr over 30 Minutes Intravenous 30 min pre-op 01/25/21 1546         Radiology Reports  DG Chest 1 View  Result Date: 01/24/2021 CLINICAL DATA:  Left hip and wrist fractures.  Pre-op clearance exam EXAM: CHEST  1 VIEW COMPARISON:  04/04/2020 FINDINGS: The heart size and mediastinal contours are within normal limits. Prior CABG again noted. Small calcified granuloma again seen in the right upper lobe. Both lungs are otherwise clear. The visualized skeletal structures are unremarkable. IMPRESSION: No active cardiopulmonary disease. Electronically Signed   By: Marlaine Hind M.D.   On: 01/24/2021 21:24   DG Wrist Complete Left  Result Date: 01/24/2021 CLINICAL DATA:  Fall. Left wrist pain and deformity. Initial encounter. EXAM: LEFT WRIST - COMPLETE 3+ VIEW COMPARISON:  None. FINDINGS: Internal fixation plate and screws are seen in the distal radius. A displaced  fracture is seen involving the distal radial diaphysis, near the proximal margin of the fixation plate, with moderate dorsal angulation of the distal radius. No other acute fractures are identified. Old fracture deformity is seen involving the distal ulnar diaphysis and ulnar styloid process. Advanced osteoarthritis is seen involving the base of the thumb. Generalized osteopenia also noted. IMPRESSION: Displaced fracture of the distal radial diaphysis, near the proximal margin of the fixation plate, with moderate dorsal angulation. Old fracture deformities of distal ulnar diaphysis and ulnar styloid process. Electronically Signed   By: Marlaine Hind M.D.   On: 01/24/2021 21:18   CT Head Wo Contrast  Result Date: 01/24/2021 CLINICAL DATA:  Fall. EXAM: CT HEAD WITHOUT CONTRAST CT CERVICAL SPINE WITHOUT CONTRAST TECHNIQUE: Multidetector CT imaging of the head and cervical spine was performed following the standard protocol without intravenous contrast. Multiplanar CT image reconstructions of the cervical spine were also generated. COMPARISON:  11/16/2020 FINDINGS: CT HEAD FINDINGS Brain: There is no evidence for acute hemorrhage, hydrocephalus, mass lesion, or abnormal extra-axial fluid collection. No definite CT evidence for acute infarction. Diffuse loss of parenchymal volume is consistent with atrophy. Patchy low attenuation in the deep hemispheric and periventricular white matter is nonspecific, but likely reflects chronic microvascular ischemic demyelination. Vascular: No hyperdense vessel or unexpected calcification. Skull: No evidence for fracture. No worrisome lytic or sclerotic lesion. Sinuses/Orbits: The visualized paranasal sinuses and mastoid air cells are clear. Visualized portions of  the globes and intraorbital fat are unremarkable. Other: None. CT CERVICAL SPINE FINDINGS Alignment: Normal. Skull base and vertebrae: No acute fracture. No primary bone lesion or focal pathologic process. Soft tissues and  spinal canal: No prevertebral fluid or swelling. No visible canal hematoma. Disc levels: Diffuse loss of disc height with endplate degeneration noted in the cervical spine. Scattered facet osteoarthritis noted bilaterally. Upper chest: Biapical pleuroparenchymal scarring evident. Other: None. IMPRESSION: 1. No acute intracranial abnormality. Atrophy with chronic small vessel white matter ischemic disease. 2. Degenerative changes in the cervical spine without fracture. Electronically Signed   By: Misty Stanley M.D.   On: 01/24/2021 21:44   CT Cervical Spine Wo Contrast  Result Date: 01/24/2021 CLINICAL DATA:  Fall. EXAM: CT HEAD WITHOUT CONTRAST CT CERVICAL SPINE WITHOUT CONTRAST TECHNIQUE: Multidetector CT imaging of the head and cervical spine was performed following the standard protocol without intravenous contrast. Multiplanar CT image reconstructions of the cervical spine were also generated. COMPARISON:  11/16/2020 FINDINGS: CT HEAD FINDINGS Brain: There is no evidence for acute hemorrhage, hydrocephalus, mass lesion, or abnormal extra-axial fluid collection. No definite CT evidence for acute infarction. Diffuse loss of parenchymal volume is consistent with atrophy. Patchy low attenuation in the deep hemispheric and periventricular white matter is nonspecific, but likely reflects chronic microvascular ischemic demyelination. Vascular: No hyperdense vessel or unexpected calcification. Skull: No evidence for fracture. No worrisome lytic or sclerotic lesion. Sinuses/Orbits: The visualized paranasal sinuses and mastoid air cells are clear. Visualized portions of the globes and intraorbital fat are unremarkable. Other: None. CT CERVICAL SPINE FINDINGS Alignment: Normal. Skull base and vertebrae: No acute fracture. No primary bone lesion or focal pathologic process. Soft tissues and spinal canal: No prevertebral fluid or swelling. No visible canal hematoma. Disc levels: Diffuse loss of disc height with endplate  degeneration noted in the cervical spine. Scattered facet osteoarthritis noted bilaterally. Upper chest: Biapical pleuroparenchymal scarring evident. Other: None. IMPRESSION: 1. No acute intracranial abnormality. Atrophy with chronic small vessel white matter ischemic disease. 2. Degenerative changes in the cervical spine without fracture. Electronically Signed   By: Misty Stanley M.D.   On: 01/24/2021 21:44   CT Lumbar Spine Wo Contrast  Result Date: 01/24/2021 CLINICAL DATA:  Initial evaluation for acute low back pain, recent fall. EXAM: CT LUMBAR SPINE WITHOUT CONTRAST TECHNIQUE: Multidetector CT imaging of the lumbar spine was performed without intravenous contrast administration. Multiplanar CT image reconstructions were also generated. COMPARISON:  Recent CT from 11/16/2020. FINDINGS: Segmentation: Standard. Lowest well-formed disc space labeled the L5-S1 level. Alignment: Levoscoliosis with apex at L3-4. No significant listhesis. Vertebrae: There is an acute on subacute burst type compression fracture involving the L2 vertebral body with progressive collapse and height loss since previous. Height loss now measures up to 65% with progressive 7 mm bony retropulsion. Multiple additional chronic compression fractures again noted, with sequelae of prior vertebral augmentation at T12, L3, L4, and L5. These are stable in appearance. Additional chronic fracture of the distal sacrum/coccyx noted (series 7, image 48). No other acute osseous abnormality. No discrete or worrisome osseous lesions. Paraspinal and other soft tissues: Paraspinous edema adjacent to the L2 compression fracture. Chronic fatty atrophy of the posterior paraspinous musculature. Treated intra-abdominal aneurysm with aorto bi-iliac stent in place. Colonic diverticulosis partially visualized. Probable nonobstructive left renal nephrolithiasis. Disc levels: L1-2: Progressive 7 mm bony retropulsion related to the L2 compression fracture with  secondary flattening of the ventral thecal sac. Mild facet hypertrophy. Moderate spinal stenosis is now seen at  this level. Moderate bilateral L1 foraminal narrowing. L2-3: Mild disc bulge with annular calcification. Mild facet hypertrophy. No spinal stenosis. Mild to moderate bilateral L2 foraminal narrowing. L3-4: Degenerative intervertebral disc space narrowing with diffuse disc bulge, eccentric to the right. Annular calcification with reactive endplate change. Moderate right worse than left facet hypertrophy. Mild canal with mild to moderate right worse than left lateral recess stenosis. Moderate right worse than left L3 foraminal narrowing. L4-5: Degenerative intervertebral disc space narrowing with diffuse disc bulge and disc desiccation. Prominent right-sided reactive endplate spurring. Moderate right with mild left facet degeneration. No significant spinal stenosis. Moderate right worse than left L4 foraminal narrowing. L5-S1: Degenerative intervertebral disc space narrowing with diffuse disc bulge and reactive endplate spurring. Mild facet hypertrophy. No significant spinal stenosis. Moderate bilateral L5 foraminal narrowing. IMPRESSION: 1. Acute on subacute compression fracture involving the L2 vertebral body with progressive collapse and height loss since previous, now measuring up to 65% with progressive 7 mm bony retropulsion. Moderate spinal stenosis now seen at this level. 2. Multiple additional chronic compression fractures with sequelae of prior vertebral augmentation at T12, L3, L4, and L5, stable. 3. Additional chronic fracture of the distal sacrum/coccyx. 4. Moderate multilevel degenerative spondylosis with resultant moderate bilateral foraminal narrowing at L1 through L5. Electronically Signed   By: Jeannine Boga M.D.   On: 01/24/2021 22:06   DG Hip Unilat W or Wo Pelvis 2-3 Views Left  Result Date: 01/24/2021 CLINICAL DATA:  Fall.  Left hip pain.  Initial encounter. EXAM: DG HIP  (WITH OR WITHOUT PELVIS) 2-3V LEFT COMPARISON:  11/16/2020 FINDINGS: Mildly displaced intertrochanteric left hip fracture seen. No evidence of dislocation. Generalized osteopenia is noted. A nondisplaced fracture seen involving the right inferior pubic ramus. No other pelvic fractures identified. IMPRESSION: Mildly displaced intertrochanteric left hip fracture. Nondisplaced fracture of right inferior pubic ramus. Osteopenia. Electronically Signed   By: Marlaine Hind M.D.   On: 01/24/2021 21:15      DVT prophylaxis: Lovenox  Code Status: Full code  Family Communication: Discussed with patient's son at bedside   Consultants:  Orthopedics  Procedures:      Objective    Physical Examination:    General: Appears in no acute distress  Cardiovascular: S1-S2, regular, no murmur auscultated  Respiratory: Clear to auscultation bilaterally  Abdomen: Abdomen is soft, nontender, no organomegaly  Extremities: Left upper extremity is in splint  Neurologic: Alert, oriented x3, no focal deficit noted   Status is: Inpatient  Dispo: The patient is from: Home              Anticipated d/c is to: Skilled nursing facility              Anticipated d/c date is: 01/28/2021              Patient currently not stable for discharge  Barrier to discharge-awaiting left hip repair  COVID-19 Labs  No results for input(s): DDIMER, FERRITIN, LDH, CRP in the last 72 hours.  Lab Results  Component Value Date   [employer] NEGATIVE 01/24/2021   [place] NEGATIVE 09/27/2020   [place] NEGATIVE 01/11/2020   [place] NEGATIVE 12/26/2019    Microbiology  Recent Results (from the past 240 hour(s))  Resp Panel by RT-PCR (Flu A&B, Covid) Nasopharyngeal Swab     Status: None   Collection Time: 01/24/21  1:43 AM   Specimen: Nasopharyngeal Swab; Nasopharyngeal(NP) swabs in vial transport medium  Result Value Ref Range Status   SARS Coronavirus 2 by RT  PCR NEGATIVE NEGATIVE Final     Comment: (NOTE) SARS-CoV-2 target nucleic acids are NOT DETECTED.  The SARS-CoV-2 RNA is generally detectable in upper respiratory specimens during the acute phase of infection. The lowest concentration of SARS-CoV-2 viral copies this assay can detect is 138 copies/mL. A negative result does not preclude SARS-Cov-2 infection and should not be used as the sole basis for treatment or other patient management decisions. A negative result may occur with  improper specimen collection/handling, submission of specimen other than nasopharyngeal swab, presence of viral mutation(s) within the areas targeted by this assay, and inadequate number of viral copies(<138 copies/mL). A negative result must be combined with clinical observations, patient history, and epidemiological information. The expected result is Negative.  Fact Sheet for Patients:  EntrepreneurPulse.com.au  Fact Sheet for Healthcare Providers:  IncredibleEmployment.be  This test is no t yet approved or cleared by the [place] FDA and  has been authorized for detection and/or diagnosis of SARS-CoV-2 by FDA under an Emergency Use Authorization (EUA). This EUA will remain  in effect (meaning this test can be used) for the duration of the COVID-19 declaration under Section 564(b)(1) of the Act, 21 U.S.C.section 360bbb-3(b)(1), unless the authorization is terminated  or revoked sooner.       Influenza A by PCR NEGATIVE NEGATIVE Final   Influenza B by PCR NEGATIVE NEGATIVE Final    Comment: (NOTE) The Xpert Xpress SARS-CoV-2/FLU/RSV plus assay is intended as an aid in the diagnosis of influenza from Nasopharyngeal swab specimens and should not be used as a sole basis for treatment. Nasal washings and aspirates are unacceptable for Xpert Xpress SARS-CoV-2/FLU/RSV testing.  Fact Sheet for Patients: EntrepreneurPulse.com.au  Fact Sheet for Healthcare  Providers: IncredibleEmployment.be  This test is not yet approved or cleared by the [place] FDA and has been authorized for detection and/or diagnosis of SARS-CoV-2 by FDA under an Emergency Use Authorization (EUA). This EUA will remain in effect (meaning this test can be used) for the duration of the COVID-19 declaration under Section 564(b)(1) of the Act, 21 U.S.C. section 360bbb-3(b)(1), unless the authorization is terminated or revoked.  Performed at [facility], [address] [place], [place] [address]              [facility] If 7PM-7AM, please contact night-coverage at [email or web address], Office  [phone]   01/25/2021, 3:56 PM  LOS: 1 day

## 2021-01-25 NOTE — ED Notes (Signed)
ED to IP handoff completed with SE floor RN.

## 2021-01-25 NOTE — ED Notes (Signed)
Informed Rn bed assigned  

## 2021-01-25 NOTE — Plan of Care (Signed)

## 2021-01-25 NOTE — ED Notes (Signed)
Float RN assisting with emergency so [facility] NT will transport pt to floor soon.

## 2021-01-25 NOTE — Progress Notes (Signed)
Initial Nutrition Assessment  DOCUMENTATION CODES:  Severe malnutrition in context of chronic illness  INTERVENTION:   Continue current diet as ordered, encouraged PO intake  Ensure Enlive po BID, each supplement provides 350 kcal and 20 grams of protein  MVI with minerals daily  NUTRITION DIAGNOSIS:  Severe Malnutrition (in the context of chrinic illness) related to poor appetite as evidenced by percent weight loss,severe muscle depletion,severe fat depletion (15.7% x 3 months).  GOAL:  Patient will meet greater than or equal to 90% of their needs  MONITOR:  PO intake,Supplement acceptance,Weight trends,Skin  REASON FOR ASSESSMENT:  Consult Hip fracture protocol  ASSESSMENT:  Pt presented to ED from independent living facility following a fall on his left side with immediate pain in his hip and wrist. Imaging in ED showed distal radial fracture, and mildly displaced intertrochanteric left hip fracture/ nondisplaced fracture right inferior pubic ramus. Pt on eliquis at baseline, will require washout prior to surgical intervention. Past medical history significant for atrial fibrillation, COPD, HTN, CAD s/p CABG, dementia, colon cancer, GERD.    Pt sleeping soundly at the time of visit, but woke to name being called. Pt endorses weight loss from usual of about 170 lb. Also states that his appetite has been poor for sometime causing a decrease in intake. Significant weight loss of 15.7% is noted over the last 3 months (2/9-5/27). Pt somewhat sleepy during assessment, but states he does not routinely drink nutrition supplements at baseline. Discussed maximizing nutrition status prior to surgery, agreeable to receiving.  Nutritionally Relevant Medications: PRN Meds: bisacodyl, senna-docusate  Labs Reviewed  NUTRITION - FOCUSED PHYSICAL EXAM: Flowsheet Row Most Recent Value  Orbital Region Mild depletion  Upper Arm Region Severe depletion  Thoracic and Lumbar Region Moderate  depletion  Buccal Region Mild depletion  Temple Region Mild depletion  Clavicle Bone Region Severe depletion  Clavicle and Acromion Bone Region Severe depletion  Scapular Bone Region Moderate depletion  Dorsal Hand Severe depletion  Patellar Region Moderate depletion  Anterior Thigh Region Moderate depletion  Posterior Calf Region Moderate depletion  Edema (RD Assessment) None  Hair Reviewed  Eyes Reviewed  Mouth Reviewed  Skin Reviewed  Nails Reviewed     Diet Order:   Diet Order            Diet regular Room service appropriate? Yes; Fluid consistency: Thin  Diet effective now                 EDUCATION NEEDS:  No education needs have been identified at this time  Skin:     Last BM:     Height:  Ht Readings from Last 1 Encounters:  01/24/21 6' (1.829 m)    Weight:  Wt Readings from Last 1 Encounters:  01/25/21 66.2 kg    Ideal Body Weight:  80.9 kg  BMI:  Body mass index is 19.79 kg/m.  Estimated Nutritional Needs:   Kcal:  1900-2100 kcal/d  Protein:  95-110 g/d  Fluid:  >2 L/d   Ranell Patrick, RD, LDN Clinical Dietitian Pager on [facility]

## 2021-01-25 NOTE — ED Notes (Addendum)
Secretary putting in transport request as NT unable to take pt to floor currently. Pt's son remains at bedside.

## 2021-01-25 NOTE — ED Notes (Signed)
Dee RN to take pt up soon. Floor RN notified.

## 2021-01-25 NOTE — ED Notes (Signed)
Informed RN bed assigned 

## 2021-01-26 DIAGNOSIS — S72002A Fracture of unspecified part of neck of left femur, initial encounter for closed fracture: Secondary | ICD-10-CM | POA: Diagnosis not present

## 2021-01-26 LAB — CBC
HCT: 27.8 % — ABNORMAL LOW (ref 39.0–52.0)
Hemoglobin: 9.3 g/dL — ABNORMAL LOW (ref 13.0–17.0)
MCH: 31.2 pg (ref 26.0–34.0)
MCHC: 33.5 g/dL (ref 30.0–36.0)
MCV: 93.3 fL (ref 80.0–100.0)
Platelets: 121 10*3/uL — ABNORMAL LOW (ref 150–400)
RBC: 2.98 MIL/uL — ABNORMAL LOW (ref 4.22–5.81)
RDW: 16.3 % — ABNORMAL HIGH (ref 11.5–15.5)
WBC: 9.5 10*3/uL (ref 4.0–10.5)
nRBC: 0 % (ref 0.0–0.2)

## 2021-01-26 LAB — APTT
aPTT: >200 seconds (ref 24–36)
aPTT: 84 seconds — ABNORMAL HIGH (ref 24–36)
aPTT: 75 seconds — ABNORMAL HIGH (ref 24–36)

## 2021-01-26 LAB — HEPARIN LEVEL (UNFRACTIONATED): Heparin Unfractionated: >1.1 IU/mL — ABNORMAL HIGH (ref 0.30–0.70)

## 2021-01-26 MED ORDER — HEPARIN (PORCINE) 25000 UT/250ML-% IV SOLN
750.0000 [IU]/h | INTRAVENOUS | Status: DC
  Administered 2021-01-26 (×2): 750 [IU]/h via INTRAVENOUS
  Filled 2021-01-26: qty 250

## 2021-01-26 NOTE — Progress Notes (Signed)
[facility] for heparin infusion Indication: atrial fibrillation  Allergies  Allergen Reactions  . Clonazepam Other (See Comments)    Altered mental status  . Codeine Other (See Comments)    Altered mental status.  . Ropinirole Swelling    Other reaction(s): Hallucination  . Hydrocodone     confusion  . Niacin And Related Other (See Comments)    Flushing of skin  . Oxycodone Other (See Comments)    confusion confusion  . Tramadol     Other reaction(s): Hallucination    Patient Measurements: Height: 6' (182.9 cm) Weight: 66.2 kg (145 lb 15.1 oz) (bed weight) IBW/kg (Calculated) : 77.6  Vital Signs: Temp: 98.6 F (37 C) (05/28 0846) Temp Source: Oral (05/28 0511) BP: 106/60 (05/28 0846) Pulse Rate: 87 (05/28 0846)  Labs: Recent Labs    01/24/21 2041 01/25/21 1638 01/26/21 0055 01/26/21 0418 01/26/21 1329  HGB 11.3*  --   --  9.3*  --   HCT 33.9*  --   --  27.8*  --   PLT 158  --   --  121*  --   APTT  --  43* >200*  --  84*  LABPROT  --  19.1*  --   --   --   INR  --  1.6*  --   --   --   HEPARINUNFRC  --  >1.10*  --  >1.10*  --   CREATININE 0.87  --   --   --   --     Estimated Creatinine Clearance: 58.1 mL/min (by C-G formula based on SCr of 0.87 mg/dL).   Medical History: Past Medical History:  Diagnosis Date  . AAA (abdominal aortic aneurysm) ([facility]) 05/2019   Relativley Rapid progression from June-Aug 2020 (4.6 - 5.4 cm): s/p EVAR (Dr. Lucky [facility]): EVAR - 23 mm prox, 12 cm distal & x 18 cm length Gore Excluder Endoprosthesis Main body (via RFA distal to lowest Renal A); 63mm x 14 cm L Contralateral Limb for L Iliac - extended with 8 mm x 6cm LifeStar stent (post-dilated with 7 mm DEB).  Additional R Iliac PTA not required.   . 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 ([place]) 09/15/2017   09-15-2017 DIAGNOSIS:  A. COLON POLYP X 2, ASCENDING; COLD SNARE:  - TUBULAR ADENOMA.  - NEGATIVE FOR HIGH-GRADE DYSPLASIA AND MALIGNANCY.  - NO  ADDITIONAL TISSUE IDENTIFIED; FECAL MATERIAL PRESENT.   B. COLON POLYP X 2, DESCENDING; COLD SNARE:  - ADENOCARCINOMA ARISING IN A DYSPLASTIC SESSILE SERRATED ADENOMA, WITH  LYMPHOVASCULAR INVASION, SEE COMMENT.  - TUBULAR ADENOMA, 2 FRAGMENTS, NEGATIVE FO  . Arthritis   . Basal cell carcinoma of eyelid 01/03/2013   2019 - R side of Nose  . Benign neoplasm of ascending colon   . Benign neoplasm of descending colon   . Bilateral iliac artery stenosis ([facility]) 2007   Bilateral Iliac A stenting; extension of EVAR limbs into both Iliacs -- additional stent placed in L Iliac.  . Cancer ([place] [facility])    Skin cancer  . Colon polyps   . Complication of anesthesia    severe confusion agitation requiring hospital admission x 4 days  . Compression fracture of fourth lumbar vertebra ([facility]) 04/2019   - s/p Kyphoplasty; T12, L3-5 [facility])  . COPD (chronic obstructive pulmonary disease) ([facility])    Centrilobular Emphysema  . Coronary artery disease involving native coronary artery without angina pectoris 1998   - s/p CABG, occluded SVG-D1; patent LIMA-LAD,  SVG-OM, SVG- RCA  . Dementia ([facility])    memory loss - started noting late 2019  . Falls frequently    broke foot  . Fracture 2021   left foot  . GERD (gastroesophageal reflux disease)   . Hemorrhoid 02/10/2015  . History of hiatal hernia   . History of kidney stones   . HOH (hard of hearing)   . Hypertension   . Lewy body dementia ([place]) 04/04/2020  . Macular degeneration disease   . Osteoporosis   . PAF (paroxysmal atrial fibrillation) ([place])    on Eliquis for OAC - Rate Control   . Prostate cancer ([place])   . Prostate disorder   . Sciatic pain    Chronic  . Sleep apnea    cpap  . Temporary cerebral vascular dysfunction 02/10/2015   Had negative work-up.  July, 2012.  No medication changes, had forgot them that day, got dehydrated.   . Urinary incontinence     Medications:  PTA Eliquis 5 mg BID - last dose 5/26 at 0900  Assessment: 87 y.o. male with  medical history significant for Paroxysmal A. fib on Eliquis, COPD, HTN, CAD with history of CABG, OSA on CPAP and dementia who presents from home following an accidental fall in which she stepped back falling onto his left side with immediate pain in his left hip and left wrist. Pt planned to undergo intramedullary fixation of left intertrochanteric hip fracture as well as revision plating of left radius fracture 5/31. Pharmacy has been consulted for heparin dosing and monitoring. Pt takes Eliquis PTA - last dose noted to be 5/26 @0900 .  Hgb 11.3, Plt 158; all other baseline labs pending  Goal of Therapy:  Heparin level 0.3-0.7 units/ml once correlating with aPTT  aPTT 66-102 seconds Monitor platelets by anticoagulation protocol: Yes   Plan:  HL: 5/28 @ 0418:  HL > 1.10 Will continue to use aPTT to guide dosing. Will recheck HL on 5/29 with AM labs.   APTT: 5/28 @ 1329 = 84. therapeutic Will continue heparin drip @ 750 units/hr.   Will check confirmatory aPTT in 8 hrs.   Erinn Mendosa A, PharmD 01/26/2021 2:12 PM

## 2021-01-26 NOTE — TOC Progression Note (Signed)
Transition of Care [facility]) - Progression Note    Patient Details  Name: [name] MRN: [id number] Date of Birth: 1934/04/18  Transition of Care [facility]) CM/SW [place], RN Phone Number: 01/26/2021, 2:28 PM  Clinical Narrative:   Spoke with patient's son [name].  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  Patient's son states that once/if patient is appropriate to return to [place], he would like his father to return there.  Son is open to rehab in a SNF if this is recommended after surgery.  TOC contact information given, TOC will continue to follow patient's needs throughout hospitalization.           Expected Discharge Plan and Services                                                 Social Determinants of Health (SDOH) Interventions    Readmission Risk Interventions Readmission Risk Prevention Plan 12/28/2019 05/19/2019  Transportation Screening Complete Complete  HRI or [place] - Complete  Social Work Consult for [place] Planning/Counseling - Complete  Palliative Care Screening - Not Applicable  Medication Review [employer]) Complete Complete  HRI or Home Care Consult Complete -  [place] Complete -  Some recent data might be hidden

## 2021-01-26 NOTE — Progress Notes (Signed)
[facility]  PROGRESS NOTE  STORY VANVRANKEN DGU:[id number] DOB: 04/02/1934 DOA: 01/24/2021 PCP: [name], MD   Brief HPI:   87 year old male with medical history of paroxysmal atrial fibrillation on Eliquis, COPD, hypertension, CAD with history of CABG, OSA on CPAP, dementia presented from home following accidental fall in which she stepped back falling onto his left side with immediate pain in the left hip and left wrist.  He had no loss of consciousness, denies hitting head. Left wrist x-ray showed displaced fracture distal radial diaphysis. Pelvis and left hip x-ray showed mildly displaced intertrochanteric left hip fracture.  Nondisplaced fracture right inferior pubic ramus.  Subjective  Patient seen and examined, denies pain.     Assessment/Plan:     1. Left displaced intertrochanteric hip fracture-orthopedic surgery has been consulted, plan to perform intramedullary fixation on Tuesday.  Patient has been on Eliquis and will require at least 3 days of Eliquis washout before fracture can be fixed.  Management per orthopedics. 2. Paroxysmal atrial fibrillation-patient has history of atrial fibrillation, heart rate is well controlled.  We will continue with metoprolol 25 mg p.o. twice daily.  Eliquis is currently on hold due to above.  Orthopedics is okay with starting short acting anticoagulation.  We will start heparin per pharmacy till Monday midnight.  Patient has surgery scheduled for Tuesday. 3. Hypertension-continue metoprolol, will hold lisinopril as blood pressure is not significantly elevated. 4. OSA on CPAP-continue home CPAP. 5. COPD-no acute exacerbation, continue home inhalers, DuoNebs as needed. 6. Dementia without behavior disturbance-continue Seroquel 7. CAD with history of CABG-no recent complaints of chest pain or shortness of breath.  EKG is unremarkable.  Continue metoprolol. 8. Risk stratification-patient has history of CAD s/p CABG, EKG showed no  acute abnormality.  He does not have chest pain or shortness of breath.  No further testing warranted at this time.  At this time patient would be high risk for surgery considering history of CAD and advanced age.  However benefit of surgery outweighs the risk involved considering advanced age and poor quality of life without hip repair.    Scheduled medications:   . acetaminophen  650 mg Oral Once  . feeding supplement  237 mL Oral BID BM  . metoprolol tartrate  25 mg Oral BID  . multivitamin with minerals  1 tablet Oral Daily         Data Reviewed:   CBG:  No results for input(s): GLUCAP in the last 168 hours.  SpO2: 98 %    Vitals:   01/25/21 1955 01/26/21 0019 01/26/21 0511 01/26/21 0846  BP: (!) 109/55 (!) 100/58 (!) 101/53 106/60  Pulse: (!) 107 98 (!) 105 87  Resp: 17 17 17 20   Temp: 98.1 F (36.7 C) 98.1 F (36.7 C) 97.8 F (36.6 C) 98.6 F (37 C)  TempSrc: Oral  Oral   SpO2: 96% 96% 92% 98%  Weight:      Height:         Intake/Output Summary (Last 24 hours) at 01/26/2021 1532 Last data filed at 01/25/2021 1852 Gross per 24 hour  Intake 50.11 ml  Output --  Net 50.11 ml    05/26 1901 - 05/28 0700 In: 50.1 [I.V.:50.1] Out: -   Filed Weights   01/24/21 2028 01/25/21 1429  Weight: 68.9 kg 66.2 kg    CBC:  Recent Labs  Lab 01/24/21 2041 01/26/21 0418  WBC 10.2 9.5  HGB 11.3* 9.3*  HCT 33.9* 27.8*  PLT 158  121*  MCV 95.0 93.3  MCH 31.7 31.2  MCHC 33.3 33.5  RDW 15.5 16.3*  LYMPHSABS 1.9  --   MONOABS 0.9  --   EOSABS 0.2  --   BASOSABS 0.1  --     Complete metabolic panel:  Recent Labs  Lab 01/24/21 2041 01/25/21 1638  NA 139  --   K 4.1  --   CL 104  --   CO2 26  --   GLUCOSE 107*  --   BUN 16  --   CREATININE 0.87  --   CALCIUM 9.0  --   INR  --  1.6*    No results for input(s): LIPASE, AMYLASE in the last 168 hours.  Recent Labs  Lab 01/24/21 0143  SARSCOV2NAA NEGATIVE     ------------------------------------------------------------------------------------------------------------------ No results for input(s): CHOL, HDL, LDLCALC, TRIG, CHOLHDL, LDLDIRECT in the last 72 hours.  No results found for: HGBA1C ------------------------------------------------------------------------------------------------------------------ No results for input(s): TSH, T4TOTAL, T3FREE, THYROIDAB in the last 72 hours.  Invalid input(s): FREET3 ------------------------------------------------------------------------------------------------------------------ No results for input(s): VITAMINB12, FOLATE, FERRITIN, TIBC, IRON, RETICCTPCT in the last 72 hours.  Coagulation profile Recent Labs  Lab 01/25/21 1638  INR 1.6*   No results for input(s): DDIMER in the last 72 hours.  Cardiac Enzymes No results for input(s): CKTOTAL, CKMB, CKMBINDEX, TROPONINI in the last 168 hours.  ------------------------------------------------------------------------------------------------------------------    Component Value Date/Time   BNP 86.0 06/09/2016 1123     Antibiotics: Anti-infectives (From admission, onward)   Start     Dose/Rate Route Frequency Ordered Stop   01/29/21 0000  ceFAZolin (ANCEF) IVPB 2g/100 mL premix        2 g 200 mL/hr over 30 Minutes Intravenous 30 min pre-op 01/25/21 1546         Radiology Reports  DG Chest 1 View  Result Date: 01/24/2021 CLINICAL DATA:  Left hip and wrist fractures.  Pre-op clearance exam EXAM: CHEST  1 VIEW COMPARISON:  04/04/2020 FINDINGS: The heart size and mediastinal contours are within normal limits. Prior CABG again noted. Small calcified granuloma again seen in the right upper lobe. Both lungs are otherwise clear. The visualized skeletal structures are unremarkable. IMPRESSION: No active cardiopulmonary disease. Electronically Signed   By: Marlaine Hind M.D.   On: 01/24/2021 21:24   DG Wrist Complete Left  Result Date:  01/24/2021 CLINICAL DATA:  Fall. Left wrist pain and deformity. Initial encounter. EXAM: LEFT WRIST - COMPLETE 3+ VIEW COMPARISON:  None. FINDINGS: Internal fixation plate and screws are seen in the distal radius. A displaced fracture is seen involving the distal radial diaphysis, near the proximal margin of the fixation plate, with moderate dorsal angulation of the distal radius. No other acute fractures are identified. Old fracture deformity is seen involving the distal ulnar diaphysis and ulnar styloid process. Advanced osteoarthritis is seen involving the base of the thumb. Generalized osteopenia also noted. IMPRESSION: Displaced fracture of the distal radial diaphysis, near the proximal margin of the fixation plate, with moderate dorsal angulation. Old fracture deformities of distal ulnar diaphysis and ulnar styloid process. Electronically Signed   By: Marlaine Hind M.D.   On: 01/24/2021 21:18   CT Head Wo Contrast  Result Date: 01/24/2021 CLINICAL DATA:  Fall. EXAM: CT HEAD WITHOUT CONTRAST CT CERVICAL SPINE WITHOUT CONTRAST TECHNIQUE: Multidetector CT imaging of the head and cervical spine was performed following the standard protocol without intravenous contrast. Multiplanar CT image reconstructions of the cervical spine were also generated. COMPARISON:  11/16/2020  FINDINGS: CT HEAD FINDINGS Brain: There is no evidence for acute hemorrhage, hydrocephalus, mass lesion, or abnormal extra-axial fluid collection. No definite CT evidence for acute infarction. Diffuse loss of parenchymal volume is consistent with atrophy. Patchy low attenuation in the deep hemispheric and periventricular white matter is nonspecific, but likely reflects chronic microvascular ischemic demyelination. Vascular: No hyperdense vessel or unexpected calcification. Skull: No evidence for fracture. No worrisome lytic or sclerotic lesion. Sinuses/Orbits: The visualized paranasal sinuses and mastoid air cells are clear. Visualized portions  of the globes and intraorbital fat are unremarkable. Other: None. CT CERVICAL SPINE FINDINGS Alignment: Normal. Skull base and vertebrae: No acute fracture. No primary bone lesion or focal pathologic process. Soft tissues and spinal canal: No prevertebral fluid or swelling. No visible canal hematoma. Disc levels: Diffuse loss of disc height with endplate degeneration noted in the cervical spine. Scattered facet osteoarthritis noted bilaterally. Upper chest: Biapical pleuroparenchymal scarring evident. Other: None. IMPRESSION: 1. No acute intracranial abnormality. Atrophy with chronic small vessel white matter ischemic disease. 2. Degenerative changes in the cervical spine without fracture. Electronically Signed   By: Misty Stanley M.D.   On: 01/24/2021 21:44   CT Cervical Spine Wo Contrast  Result Date: 01/24/2021 CLINICAL DATA:  Fall. EXAM: CT HEAD WITHOUT CONTRAST CT CERVICAL SPINE WITHOUT CONTRAST TECHNIQUE: Multidetector CT imaging of the head and cervical spine was performed following the standard protocol without intravenous contrast. Multiplanar CT image reconstructions of the cervical spine were also generated. COMPARISON:  11/16/2020 FINDINGS: CT HEAD FINDINGS Brain: There is no evidence for acute hemorrhage, hydrocephalus, mass lesion, or abnormal extra-axial fluid collection. No definite CT evidence for acute infarction. Diffuse loss of parenchymal volume is consistent with atrophy. Patchy low attenuation in the deep hemispheric and periventricular white matter is nonspecific, but likely reflects chronic microvascular ischemic demyelination. Vascular: No hyperdense vessel or unexpected calcification. Skull: No evidence for fracture. No worrisome lytic or sclerotic lesion. Sinuses/Orbits: The visualized paranasal sinuses and mastoid air cells are clear. Visualized portions of the globes and intraorbital fat are unremarkable. Other: None. CT CERVICAL SPINE FINDINGS Alignment: Normal. Skull base and  vertebrae: No acute fracture. No primary bone lesion or focal pathologic process. Soft tissues and spinal canal: No prevertebral fluid or swelling. No visible canal hematoma. Disc levels: Diffuse loss of disc height with endplate degeneration noted in the cervical spine. Scattered facet osteoarthritis noted bilaterally. Upper chest: Biapical pleuroparenchymal scarring evident. Other: None. IMPRESSION: 1. No acute intracranial abnormality. Atrophy with chronic small vessel white matter ischemic disease. 2. Degenerative changes in the cervical spine without fracture. Electronically Signed   By: Misty Stanley M.D.   On: 01/24/2021 21:44   CT Lumbar Spine Wo Contrast  Result Date: 01/24/2021 CLINICAL DATA:  Initial evaluation for acute low back pain, recent fall. EXAM: CT LUMBAR SPINE WITHOUT CONTRAST TECHNIQUE: Multidetector CT imaging of the lumbar spine was performed without intravenous contrast administration. Multiplanar CT image reconstructions were also generated. COMPARISON:  Recent CT from 11/16/2020. FINDINGS: Segmentation: Standard. Lowest well-formed disc space labeled the L5-S1 level. Alignment: Levoscoliosis with apex at L3-4. No significant listhesis. Vertebrae: There is an acute on subacute burst type compression fracture involving the L2 vertebral body with progressive collapse and height loss since previous. Height loss now measures up to 65% with progressive 7 mm bony retropulsion. Multiple additional chronic compression fractures again noted, with sequelae of prior vertebral augmentation at T12, L3, L4, and L5. These are stable in appearance. Additional chronic fracture of the distal sacrum/coccyx  noted (series 7, image 48). No other acute osseous abnormality. No discrete or worrisome osseous lesions. Paraspinal and other soft tissues: Paraspinous edema adjacent to the L2 compression fracture. Chronic fatty atrophy of the posterior paraspinous musculature. Treated intra-abdominal aneurysm with  aorto bi-iliac stent in place. Colonic diverticulosis partially visualized. Probable nonobstructive left renal nephrolithiasis. Disc levels: L1-2: Progressive 7 mm bony retropulsion related to the L2 compression fracture with secondary flattening of the ventral thecal sac. Mild facet hypertrophy. Moderate spinal stenosis is now seen at this level. Moderate bilateral L1 foraminal narrowing. L2-3: Mild disc bulge with annular calcification. Mild facet hypertrophy. No spinal stenosis. Mild to moderate bilateral L2 foraminal narrowing. L3-4: Degenerative intervertebral disc space narrowing with diffuse disc bulge, eccentric to the right. Annular calcification with reactive endplate change. Moderate right worse than left facet hypertrophy. Mild canal with mild to moderate right worse than left lateral recess stenosis. Moderate right worse than left L3 foraminal narrowing. L4-5: Degenerative intervertebral disc space narrowing with diffuse disc bulge and disc desiccation. Prominent right-sided reactive endplate spurring. Moderate right with mild left facet degeneration. No significant spinal stenosis. Moderate right worse than left L4 foraminal narrowing. L5-S1: Degenerative intervertebral disc space narrowing with diffuse disc bulge and reactive endplate spurring. Mild facet hypertrophy. No significant spinal stenosis. Moderate bilateral L5 foraminal narrowing. IMPRESSION: 1. Acute on subacute compression fracture involving the L2 vertebral body with progressive collapse and height loss since previous, now measuring up to 65% with progressive 7 mm bony retropulsion. Moderate spinal stenosis now seen at this level. 2. Multiple additional chronic compression fractures with sequelae of prior vertebral augmentation at T12, L3, L4, and L5, stable. 3. Additional chronic fracture of the distal sacrum/coccyx. 4. Moderate multilevel degenerative spondylosis with resultant moderate bilateral foraminal narrowing at L1 through L5.  Electronically Signed   By: Jeannine Boga M.D.   On: 01/24/2021 22:06   DG Hip Unilat W or Wo Pelvis 2-3 Views Left  Result Date: 01/24/2021 CLINICAL DATA:  Fall.  Left hip pain.  Initial encounter. EXAM: DG HIP (WITH OR WITHOUT PELVIS) 2-3V LEFT COMPARISON:  11/16/2020 FINDINGS: Mildly displaced intertrochanteric left hip fracture seen. No evidence of dislocation. Generalized osteopenia is noted. A nondisplaced fracture seen involving the right inferior pubic ramus. No other pelvic fractures identified. IMPRESSION: Mildly displaced intertrochanteric left hip fracture. Nondisplaced fracture of right inferior pubic ramus. Osteopenia. Electronically Signed   By: Marlaine Hind M.D.   On: 01/24/2021 21:15      DVT prophylaxis: Lovenox  Code Status: Full code  Family Communication: Discussed with patient's son at bedside   Consultants:  Orthopedics  Procedures:      Objective    Physical Examination:    General-appears in no acute distress  Heart-S1-S2, regular, no murmur auscultated  Lungs-clear to auscultation bilaterally, no wheezing or crackles auscultated  Abdomen-soft, nontender, no organomegaly  Extremities-no edema in the lower extremities  Neuro-alert, oriented x3, no focal deficit noted   Status is: Inpatient  Dispo: The patient is from: Home              Anticipated d/c is to: Skilled nursing facility              Anticipated d/c date is: 01/28/2021              Patient currently not stable for discharge  Barrier to discharge-awaiting left hip repair  COVID-19 Labs  No results for input(s): DDIMER, FERRITIN, LDH, CRP in the last 72 hours.  Lab  Results  Component Value Date   SARSCOV2NAA NEGATIVE 01/24/2021   [place] NEGATIVE 09/27/2020   [place] NEGATIVE 01/11/2020   [place] NEGATIVE 12/26/2019    Microbiology  Recent Results (from the past 240 hour(s))  Resp Panel by RT-PCR (Flu A&B, Covid) Nasopharyngeal Swab     Status:  None   Collection Time: 01/24/21  1:43 AM   Specimen: Nasopharyngeal Swab; Nasopharyngeal(NP) swabs in vial transport medium  Result Value Ref Range Status   SARS Coronavirus 2 by RT PCR NEGATIVE NEGATIVE Final    Comment: (NOTE) SARS-CoV-2 target nucleic acids are NOT DETECTED.  The SARS-CoV-2 RNA is generally detectable in upper respiratory specimens during the acute phase of infection. The lowest concentration of SARS-CoV-2 viral copies this assay can detect is 138 copies/mL. A negative result does not preclude SARS-Cov-2 infection and should not be used as the sole basis for treatment or other patient management decisions. A negative result may occur with  improper specimen collection/handling, submission of specimen other than nasopharyngeal swab, presence of viral mutation(s) within the areas targeted by this assay, and inadequate number of viral copies(<138 copies/mL). A negative result must be combined with clinical observations, patient history, and epidemiological information. The expected result is Negative.  Fact Sheet for Patients:  EntrepreneurPulse.com.au  Fact Sheet for Healthcare Providers:  IncredibleEmployment.be  This test is no t yet approved or cleared by the [place] FDA and  has been authorized for detection and/or diagnosis of SARS-CoV-2 by FDA under an Emergency Use Authorization (EUA). This EUA will remain  in effect (meaning this test can be used) for the duration of the COVID-19 declaration under Section 564(b)(1) of the Act, 21 U.S.C.section 360bbb-3(b)(1), unless the authorization is terminated  or revoked sooner.       Influenza A by PCR NEGATIVE NEGATIVE Final   Influenza B by PCR NEGATIVE NEGATIVE Final    Comment: (NOTE) The Xpert Xpress SARS-CoV-2/FLU/RSV plus assay is intended as an aid in the diagnosis of influenza from Nasopharyngeal swab specimens and should not be used as a sole basis for  treatment. Nasal washings and aspirates are unacceptable for Xpert Xpress SARS-CoV-2/FLU/RSV testing.  Fact Sheet for Patients: EntrepreneurPulse.com.au  Fact Sheet for Healthcare Providers: IncredibleEmployment.be  This test is not yet approved or cleared by the [place] FDA and has been authorized for detection and/or diagnosis of SARS-CoV-2 by FDA under an Emergency Use Authorization (EUA). This EUA will remain in effect (meaning this test can be used) for the duration of the COVID-19 declaration under Section 564(b)(1) of the Act, 21 U.S.C. section 360bbb-3(b)(1), unless the authorization is terminated or revoked.  Performed at [facility], [address], [place], [place] [address]              [facility] If 7PM-7AM, please contact night-coverage at [email or web address], Office  [phone]   01/26/2021, 3:32 PM  LOS: 2 days

## 2021-01-26 NOTE — Progress Notes (Signed)
[place] for heparin infusion Indication: atrial fibrillation  Allergies  Allergen Reactions  . Clonazepam Other (See Comments)    Altered mental status  . Codeine Other (See Comments)    Altered mental status.  . Ropinirole Swelling    Other reaction(s): Hallucination  . Hydrocodone     confusion  . Niacin And Related Other (See Comments)    Flushing of skin  . Oxycodone Other (See Comments)    confusion confusion  . Tramadol     Other reaction(s): Hallucination    Patient Measurements: Height: 6' (182.9 cm) Weight: 66.2 kg (145 lb 15.1 oz) (bed weight) IBW/kg (Calculated) : 77.6  Vital Signs: Temp: 98.8 F (37.1 C) (05/28 2040) Temp Source: Oral (05/28 2040) BP: 98/62 (05/28 2040) Pulse Rate: 100 (05/28 2040)  Labs: Recent Labs    01/24/21 2041 01/25/21 1638 01/25/21 1638 01/26/21 0055 01/26/21 0418 01/26/21 1329 01/26/21 2049  HGB 11.3*  --   --   --  9.3*  --   --   HCT 33.9*  --   --   --  27.8*  --   --   PLT 158  --   --   --  121*  --   --   APTT  --  43*   < > >200*  --  84* 75*  LABPROT  --  19.1*  --   --   --   --   --   INR  --  1.6*  --   --   --   --   --   HEPARINUNFRC  --  >1.10*  --   --  >1.10*  --   --   CREATININE 0.87  --   --   --   --   --   --    < > = values in this interval not displayed.    Estimated Creatinine Clearance: 58.1 mL/min (by C-G formula based on SCr of 0.87 mg/dL).   Medical History: Past Medical History:  Diagnosis Date  . AAA (abdominal aortic aneurysm) ([place]) 05/2019   Relativley Rapid progression from June-Aug 2020 (4.6 - 5.4 cm): s/p EVAR ([facility]): EVAR - 23 mm prox, 12 cm distal & x 18 cm length Gore Excluder Endoprosthesis Main body (via RFA distal to lowest Renal A); 91mm x 14 cm L Contralateral Limb for L Iliac - extended with 8 mm x 6cm LifeStar stent (post-dilated with 7 mm DEB).  Additional R Iliac PTA not required.   . 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  ([place]) 09/15/2017   09-15-2017 DIAGNOSIS:  A. COLON POLYP X 2, ASCENDING; COLD SNARE:  - TUBULAR ADENOMA.  - NEGATIVE FOR HIGH-GRADE DYSPLASIA AND MALIGNANCY.  - NO ADDITIONAL TISSUE IDENTIFIED; FECAL MATERIAL PRESENT.   B. COLON POLYP X 2, DESCENDING; COLD SNARE:  - ADENOCARCINOMA ARISING IN A DYSPLASTIC SESSILE SERRATED ADENOMA, WITH  LYMPHOVASCULAR INVASION, SEE COMMENT.  - TUBULAR ADENOMA, 2 FRAGMENTS, NEGATIVE FO  . Arthritis   . Basal cell carcinoma of eyelid 01/03/2013   2019 - R side of Nose  . Benign neoplasm of ascending colon   . Benign neoplasm of descending colon   . Bilateral iliac artery stenosis ([place]) 2007   Bilateral Iliac A stenting; extension of EVAR limbs into both Iliacs -- additional stent placed in L Iliac.  . Cancer ([place])    Skin cancer  . Colon polyps   . Complication of anesthesia  severe confusion agitation requiring hospital admission x 4 days  . Compression fracture of fourth lumbar vertebra ([place]) 04/2019   - s/p Kyphoplasty; T12, L3-5 [facility])  . COPD (chronic obstructive pulmonary disease) (H[facility])    Centrilobular Emphysema  . Coronary artery disease involving native coronary artery without angina pectoris 1998   - s/p CABG, occluded SVG-D1; patent LIMA-LAD, SVG-OM, SVG- RCA  . Dementia ([place])    memory loss - started noting late 2019  . Falls frequently    broke foot  . Fracture 2021   left foot  . GERD (gastroesophageal reflux disease)   . Hemorrhoid 02/10/2015  . History of hiatal hernia   . History of kidney stones   . HOH (hard of hearing)   . Hypertension   . Lewy body dementia ([place]) 04/04/2020  . Macular degeneration disease   . Osteoporosis   . PAF (paroxysmal atrial fibrillation) ([place])    on Eliquis for OAC - Rate Control   . Prostate cancer ([place])   . Prostate disorder   . Sciatic pain    Chronic  . Sleep apnea    cpap  . Temporary cerebral vascular dysfunction 02/10/2015   Had negative work-up.  July, 2012.  No medication changes, had  forgot them that day, got dehydrated.   . Urinary incontinence     Medications:  PTA Eliquis 5 mg BID - last dose 5/26 at 0900  Assessment: 87 y.o. male with medical history significant for Paroxysmal A. fib on Eliquis, COPD, HTN, CAD with history of CABG, OSA on CPAP and dementia who presents from home following an accidental fall in which she stepped back falling onto his left side with immediate pain in his left hip and left wrist. Pt planned to undergo intramedullary fixation of left intertrochanteric hip fracture as well as revision plating of left radius fracture 5/31. Pharmacy has been consulted for heparin dosing and monitoring. Pt takes Eliquis PTA - last dose noted to be 5/26 @0900 .  Hgb 11.3, Plt 158; all other baseline labs pending  Goal of Therapy:  Heparin level 0.3-0.7 units/ml once correlating with aPTT  aPTT 66-102 seconds Monitor platelets by anticoagulation protocol: Yes   Plan:   5/28: aPTT @ 1329 = 84, therapeutic X 1   5/28:  APTT @ 2049 = 75, therapeutic X 2 Will continue pt on current rate and recheck HL and aPTT on 5/29 with AM labs.   Facundo Allemand D, PharmD 01/26/2021 10:28 PM

## 2021-01-26 NOTE — Progress Notes (Addendum)
[place] for heparin infusion Indication: atrial fibrillation  Allergies  Allergen Reactions  . Clonazepam Other (See Comments)    Altered mental status  . Codeine Other (See Comments)    Altered mental status.  . Ropinirole Swelling    Other reaction(s): Hallucination  . Hydrocodone     confusion  . Niacin And Related Other (See Comments)    Flushing of skin  . Oxycodone Other (See Comments)    confusion confusion  . Tramadol     Other reaction(s): Hallucination    Patient Measurements: Height: 6' (182.9 cm) Weight: 66.2 kg (145 lb 15.1 oz) (bed weight) IBW/kg (Calculated) : 77.6  Vital Signs: Temp: 98.1 F (36.7 C) (05/28 0019) Temp Source: Oral (05/27 1955) BP: 100/58 (05/28 0019) Pulse Rate: 98 (05/28 0019)  Labs: Recent Labs    01/24/21 2041 01/25/21 1638 01/26/21 0055  HGB 11.3*  --   --   HCT 33.9*  --   --   PLT 158  --   --   APTT  --  43* >200*  LABPROT  --  19.1*  --   INR  --  1.6*  --   HEPARINUNFRC  --  >1.10*  --   CREATININE 0.87  --   --     Estimated Creatinine Clearance: 58.1 mL/min (by C-G formula based on SCr of 0.87 mg/dL).   Medical History: Past Medical History:  Diagnosis Date  . AAA (abdominal aortic aneurysm) ([place]) 05/2019   Relativley Rapid progression from June-Aug 2020 (4.6 - 5.4 cm): s/p EVAR ([facility]): EVAR - 23 mm prox, 12 cm distal & x 18 cm length Gore Excluder Endoprosthesis Main body (via RFA distal to lowest Renal A); 23mm x 14 cm L Contralateral Limb for L Iliac - extended with 8 mm x 6cm LifeStar stent (post-dilated with 7 mm DEB).  Additional R Iliac PTA not required.   . 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 ([place]) 09/15/2017   09-15-2017 DIAGNOSIS:  A. COLON POLYP X 2, ASCENDING; COLD SNARE:  - TUBULAR ADENOMA.  - NEGATIVE FOR HIGH-GRADE DYSPLASIA AND MALIGNANCY.  - NO ADDITIONAL TISSUE IDENTIFIED; FECAL MATERIAL PRESENT.   B. COLON POLYP X 2, DESCENDING; COLD SNARE:  -  ADENOCARCINOMA ARISING IN A DYSPLASTIC SESSILE SERRATED ADENOMA, WITH  LYMPHOVASCULAR INVASION, SEE COMMENT.  - TUBULAR ADENOMA, 2 FRAGMENTS, NEGATIVE FO  . Arthritis   . Basal cell carcinoma of eyelid 01/03/2013   2019 - R side of Nose  . Benign neoplasm of ascending colon   . Benign neoplasm of descending colon   . Bilateral iliac artery stenosis ([place]) 2007   Bilateral Iliac A stenting; extension of EVAR limbs into both Iliacs -- additional stent placed in L Iliac.  . Cancer ([place])    Skin cancer  . Colon polyps   . Complication of anesthesia    severe confusion agitation requiring hospital admission x 4 days  . Compression fracture of fourth lumbar vertebra ([place]) 04/2019   - s/p Kyphoplasty; T12, L3-5 [facility])  . COPD (chronic obstructive pulmonary disease) (HCC)    Centrilobular Emphysema  . Coronary artery disease involving native coronary artery without angina pectoris 1998   - s/p CABG, occluded SVG-D1; patent LIMA-LAD, SVG-OM, SVG- RCA  . Dementia ([place])    memory loss - started noting late 2019  . Falls frequently    broke foot  . Fracture 2021   left foot  . GERD (gastroesophageal reflux disease)   .  Hemorrhoid 02/10/2015  . History of hiatal hernia   . History of kidney stones   . HOH (hard of hearing)   . Hypertension   . Lewy body dementia ([place]) 04/04/2020  . Macular degeneration disease   . Osteoporosis   . PAF (paroxysmal atrial fibrillation) ([place])    on Eliquis for OAC - Rate Control   . Prostate cancer ([place])   . Prostate disorder   . Sciatic pain    Chronic  . Sleep apnea    cpap  . Temporary cerebral vascular dysfunction 02/10/2015   Had negative work-up.  July, 2012.  No medication changes, had forgot them that day, got dehydrated.   . Urinary incontinence     Medications:  PTA Eliquis 5 mg BID - last dose 5/26 at 0900  Assessment: 87 y.o. male with medical history significant for Paroxysmal A. fib on Eliquis, COPD, HTN, CAD with history of CABG, OSA  on CPAP and dementia who presents from home following an accidental fall in which she stepped back falling onto his left side with immediate pain in his left hip and left wrist. Pt planned to undergo intramedullary fixation of left intertrochanteric hip fracture as well as revision plating of left radius fracture 5/31. Pharmacy has been consulted for heparin dosing and monitoring. Pt takes Eliquis PTA - last dose noted to be 5/26 @0900 .  Hgb 11.3, Plt 158; all other baseline labs pending  Goal of Therapy:  Heparin level 0.3-0.7 units/ml once correlating with aPTT  aPTT 66-102 seconds Monitor platelets by anticoagulation protocol: Yes   Plan:  5/28 @ 0418:  HL > 1.10 Will continue to use aPTT to guide dosing. Will recheck HL on 5/29 with AM labs.   5/28 @ 0100 > 200  Will hold heparin gtt for 1 hr and restart heparin drip @ 750 units/hr.   Will recheck aPTT 8 hrs after restart.   Deberah Adolf D, PharmD 01/26/2021 4:32 AM

## 2021-01-27 DIAGNOSIS — S72002A Fracture of unspecified part of neck of left femur, initial encounter for closed fracture: Secondary | ICD-10-CM | POA: Diagnosis not present

## 2021-01-27 LAB — APTT: aPTT: 89 seconds — ABNORMAL HIGH (ref 24–36)

## 2021-01-27 LAB — CBC
HCT: 27.1 % — ABNORMAL LOW (ref 39.0–52.0)
Hemoglobin: 8.8 g/dL — ABNORMAL LOW (ref 13.0–17.0)
MCH: 30.7 pg (ref 26.0–34.0)
MCHC: 32.5 g/dL (ref 30.0–36.0)
MCV: 94.4 fL (ref 80.0–100.0)
Platelets: 124 10*3/uL — ABNORMAL LOW (ref 150–400)
RBC: 2.87 MIL/uL — ABNORMAL LOW (ref 4.22–5.81)
RDW: 16.5 % — ABNORMAL HIGH (ref 11.5–15.5)
WBC: 11 10*3/uL — ABNORMAL HIGH (ref 4.0–10.5)
nRBC: 0 % (ref 0.0–0.2)

## 2021-01-27 LAB — HEPARIN LEVEL (UNFRACTIONATED): Heparin Unfractionated: 0.69 IU/mL (ref 0.30–0.70)

## 2021-01-27 NOTE — Progress Notes (Signed)
[facility]  PROGRESS NOTE  [name] IOM:[id number] DOB: 1933-10-20 DOA: 01/24/2021 PCP: [name], MD   Brief HPI:   87 year old male with medical history of paroxysmal atrial fibrillation on Eliquis, COPD, hypertension, CAD with history of CABG, OSA on CPAP, dementia presented from home following accidental fall in which she stepped back falling onto his left side with immediate pain in the left hip and left wrist.  He had no loss of consciousness, denies hitting head. Left wrist x-ray showed displaced fracture distal radial diaphysis. Pelvis and left hip x-ray showed mildly displaced intertrochanteric left hip fracture.  Nondisplaced fracture right inferior pubic ramus.  Subjective  Patient seen and examined, denies pain at this time.    Assessment/Plan:     1. Left displaced intertrochanteric hip fracture-orthopedic surgery has been consulted, plan to perform intramedullary fixation on Tuesday.  Patient has been on Eliquis and will require at least 3 days of Eliquis washout before fracture can be fixed.  Management per orthopedics. 2. Paroxysmal atrial fibrillation-patient has history of atrial fibrillation, heart rate is well controlled.  We will continue with metoprolol 25 mg p.o. twice daily.  Eliquis is currently on hold due to above.  Orthopedics is okay with starting short acting anticoagulation.  We will start heparin per pharmacy till Monday midnight.  Patient has surgery scheduled for Tuesday. 3. Hypertension-continue metoprolol, will hold lisinopril as blood pressure is not significantly elevated. 4. OSA on CPAP-continue home CPAP. 5. COPD-no acute exacerbation, continue home inhalers, DuoNebs as needed. 6. Dementia without behavior disturbance-continue Seroquel 7. CAD with history of CABG-no recent complaints of chest pain or shortness of breath.  EKG is unremarkable.  Continue metoprolol. 8. Risk stratification-patient has history of CAD s/p CABG, EKG  showed no acute abnormality.  He does not have chest pain or shortness of breath.  No further testing warranted at this time.  At this time patient would be high risk for surgery considering history of CAD and advanced age.  However benefit of surgery outweighs the risk involved considering advanced age and poor quality of life without hip repair.    Scheduled medications:   . acetaminophen  650 mg Oral Once  . feeding supplement  237 mL Oral BID BM  . metoprolol tartrate  25 mg Oral BID  . multivitamin with minerals  1 tablet Oral Daily         Data Reviewed:   CBG:  No results for input(s): GLUCAP in the last 168 hours.  SpO2: 95 %    Vitals:   01/26/21 1605 01/26/21 2040 01/27/21 0528 01/27/21 0808  BP: 104/67 98/62 121/76 119/68  Pulse: (!) 106 100 (!) 107 (!) 101  Resp: 18 16 20 20   Temp: 98.1 F (36.7 C) 98.8 F (37.1 C) 98.8 F (37.1 C) 98.6 F (37 C)  TempSrc:  Oral Oral   SpO2: 97% 100% 97% 95%  Weight:      Height:         Intake/Output Summary (Last 24 hours) at 01/27/2021 1143 Last data filed at 01/27/2021 0450 Gross per 24 hour  Intake 79.19 ml  Output 200 ml  Net -120.81 ml    05/27 1901 - 05/29 0700 In: 79.2 [I.V.:79.2] Out: 200 [Urine:200]  Filed Weights   01/24/21 2028 01/25/21 1429  Weight: 68.9 kg 66.2 kg    CBC:  Recent Labs  Lab 01/24/21 2041 01/26/21 0418 01/27/21 0432  WBC 10.2 9.5 11.0*  HGB 11.3* 9.3* 8.8*  HCT  33.9* 27.8* 27.1*  PLT 158 121* 124*  MCV 95.0 93.3 94.4  MCH 31.7 31.2 30.7  MCHC 33.3 33.5 32.5  RDW 15.5 16.3* 16.5*  LYMPHSABS 1.9  --   --   MONOABS 0.9  --   --   EOSABS 0.2  --   --   BASOSABS 0.1  --   --     Complete metabolic panel:  Recent Labs  Lab 01/24/21 2041 01/25/21 1638  NA 139  --   K 4.1  --   CL 104  --   CO2 26  --   GLUCOSE 107*  --   BUN 16  --   CREATININE 0.87  --   CALCIUM 9.0  --   INR  --  1.6*    No results for input(s): LIPASE, AMYLASE in the last 168  hours.  Recent Labs  Lab 01/24/21 0143  SARSCOV2NAA NEGATIVE    ------------------------------------------------------------------------------------------------------------------ No results for input(s): CHOL, HDL, LDLCALC, TRIG, CHOLHDL, LDLDIRECT in the last 72 hours.  No results found for: HGBA1C ------------------------------------------------------------------------------------------------------------------ No results for input(s): TSH, T4TOTAL, T3FREE, THYROIDAB in the last 72 hours.  Invalid input(s): FREET3 ------------------------------------------------------------------------------------------------------------------ No results for input(s): VITAMINB12, FOLATE, FERRITIN, TIBC, IRON, RETICCTPCT in the last 72 hours.  Coagulation profile Recent Labs  Lab 01/25/21 1638  INR 1.6*   No results for input(s): DDIMER in the last 72 hours.  Cardiac Enzymes No results for input(s): CKTOTAL, CKMB, CKMBINDEX, TROPONINI in the last 168 hours.  ------------------------------------------------------------------------------------------------------------------    Component Value Date/Time   BNP 86.0 06/09/2016 1123     Antibiotics: Anti-infectives (From admission, onward)   Start     Dose/Rate Route Frequency Ordered Stop   01/29/21 0000  ceFAZolin (ANCEF) IVPB 2g/100 mL premix        2 g 200 mL/hr over 30 Minutes Intravenous 30 min pre-op 01/25/21 1546         Radiology Reports  No results found.    DVT prophylaxis: Lovenox  Code Status: Full code  Family Communication: Discussed with patient's son at bedside   Consultants:  Orthopedics  Procedures:      Objective    Physical Examination:    General-appears in no acute distress  Heart-S1-S2, regular, no murmur auscultated  Lungs-clear to auscultation bilaterally, no wheezing or crackles auscultated  Abdomen-soft, nontender, no organomegaly  Extremities-no edema in the lower  extremities  Neuro-alert, oriented x3, no focal deficit noted   Status is: Inpatient  Dispo: The patient is from: Home              Anticipated d/c is to: Skilled nursing facility              Anticipated d/c date is: 01/28/2021              Patient currently not stable for discharge  Barrier to discharge-awaiting left hip repair  COVID-19 Labs  No results for input(s): DDIMER, FERRITIN, LDH, CRP in the last 72 hours.  Lab Results  Component Value Date   [place] NEGATIVE 01/24/2021   [place] NEGATIVE 09/27/2020   [place] NEGATIVE 01/11/2020   [place] NEGATIVE 12/26/2019    Microbiology  Recent Results (from the past 240 hour(s))  Resp Panel by RT-PCR (Flu A&B, Covid) Nasopharyngeal Swab     Status: None   Collection Time: 01/24/21  1:43 AM   Specimen: Nasopharyngeal Swab; Nasopharyngeal(NP) swabs in vial transport medium  Result Value Ref Range Status   SARS Coronavirus 2 by RT  PCR NEGATIVE NEGATIVE Final    Comment: (NOTE) SARS-CoV-2 target nucleic acids are NOT DETECTED.  The SARS-CoV-2 RNA is generally detectable in upper respiratory specimens during the acute phase of infection. The lowest concentration of SARS-CoV-2 viral copies this assay can detect is 138 copies/mL. A negative result does not preclude SARS-Cov-2 infection and should not be used as the sole basis for treatment or other patient management decisions. A negative result may occur with  improper specimen collection/handling, submission of specimen other than nasopharyngeal swab, presence of viral mutation(s) within the areas targeted by this assay, and inadequate number of viral copies(<138 copies/mL). A negative result must be combined with clinical observations, patient history, and epidemiological information. The expected result is Negative.  Fact Sheet for Patients:  EntrepreneurPulse.com.au  Fact Sheet for Healthcare Providers:   IncredibleEmployment.be  This test is no t yet approved or cleared by the [place] FDA and  has been authorized for detection and/or diagnosis of SARS-CoV-2 by FDA under an Emergency Use Authorization (EUA). This EUA will remain  in effect (meaning this test can be used) for the duration of the COVID-19 declaration under Section 564(b)(1) of the Act, 21 U.S.C.section 360bbb-3(b)(1), unless the authorization is terminated  or revoked sooner.       Influenza A by PCR NEGATIVE NEGATIVE Final   Influenza B by PCR NEGATIVE NEGATIVE Final    Comment: (NOTE) The Xpert Xpress SARS-CoV-2/FLU/RSV plus assay is intended as an aid in the diagnosis of influenza from Nasopharyngeal swab specimens and should not be used as a sole basis for treatment. Nasal washings and aspirates are unacceptable for Xpert Xpress SARS-CoV-2/FLU/RSV testing.  Fact Sheet for Patients: EntrepreneurPulse.com.au  Fact Sheet for Healthcare Providers: IncredibleEmployment.be  This test is not yet approved or cleared by the [place] FDA and has been authorized for detection and/or diagnosis of SARS-CoV-2 by FDA under an Emergency Use Authorization (EUA). This EUA will remain in effect (meaning this test can be used) for the duration of the COVID-19 declaration under Section 564(b)(1) of the Act, 21 U.S.C. section 360bbb-3(b)(1), unless the authorization is terminated or revoked.  Performed at [facility], [address] [place], [place] [address]         [facility] If 7PM-7AM, please contact night-coverage at [email or web address], Office  [phone]   01/27/2021, 11:43 AM  LOS: 3 days

## 2021-01-27 NOTE — Progress Notes (Signed)
[place] for heparin infusion Indication: atrial fibrillation  Allergies  Allergen Reactions  . Clonazepam Other (See Comments)    Altered mental status  . Codeine Other (See Comments)    Altered mental status.  . Ropinirole Swelling    Other reaction(s): Hallucination  . Hydrocodone     confusion  . Niacin And Related Other (See Comments)    Flushing of skin  . Oxycodone Other (See Comments)    confusion confusion  . Tramadol     Other reaction(s): Hallucination    Patient Measurements: Height: 6' (182.9 cm) Weight: 66.2 kg (145 lb 15.1 oz) (bed weight) IBW/kg (Calculated) : 77.6  Vital Signs: Temp: 98.8 F (37.1 C) (05/29 0528) Temp Source: Oral (05/29 0528) BP: 121/76 (05/29 0528) Pulse Rate: 107 (05/29 0528)  Labs: Recent Labs    01/24/21 2041 01/25/21 1638 01/26/21 0055 01/26/21 0418 01/26/21 1329 01/26/21 2049 01/27/21 0432  HGB 11.3*  --   --  9.3*  --   --  8.8*  HCT 33.9*  --   --  27.8*  --   --  27.1*  PLT 158  --   --  121*  --   --  124*  APTT  --  43*   < >  --  84* 75* 89*  LABPROT  --  19.1*  --   --   --   --   --   INR  --  1.6*  --   --   --   --   --   HEPARINUNFRC  --  >1.10*  --  >1.10*  --   --  0.69  CREATININE 0.87  --   --   --   --   --   --    < > = values in this interval not displayed.    Estimated Creatinine Clearance: 58.1 mL/min (by C-G formula based on SCr of 0.87 mg/dL).   Medical History: Past Medical History:  Diagnosis Date  . AAA (abdominal aortic aneurysm) ([place]) 05/2019   Relativley Rapid progression from June-Aug 2020 (4.6 - 5.4 cm): s/p EVAR ([facility]. [facility]): EVAR - 23 mm prox, 12 cm distal & x 18 cm length Gore Excluder Endoprosthesis Main body (via RFA distal to lowest Renal A); 110mm x 14 cm L Contralateral Limb for L Iliac - extended with 8 mm x 6cm LifeStar stent (post-dilated with 7 mm DEB).  Additional R Iliac PTA not required.   . 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  ([place]) 09/15/2017   09-15-2017 DIAGNOSIS:  A. COLON POLYP X 2, ASCENDING; COLD SNARE:  - TUBULAR ADENOMA.  - NEGATIVE FOR HIGH-GRADE DYSPLASIA AND MALIGNANCY.  - NO ADDITIONAL TISSUE IDENTIFIED; FECAL MATERIAL PRESENT.   B. COLON POLYP X 2, DESCENDING; COLD SNARE:  - ADENOCARCINOMA ARISING IN A DYSPLASTIC SESSILE SERRATED ADENOMA, WITH  LYMPHOVASCULAR INVASION, SEE COMMENT.  - TUBULAR ADENOMA, 2 FRAGMENTS, NEGATIVE FO  . Arthritis   . Basal cell carcinoma of eyelid 01/03/2013   2019 - R side of Nose  . Benign neoplasm of ascending colon   . Benign neoplasm of descending colon   . Bilateral iliac artery stenosis ([place]) 2007   Bilateral Iliac A stenting; extension of EVAR limbs into both Iliacs -- additional stent placed in L Iliac.  . Cancer ([place])    Skin cancer  . Colon polyps   . Complication of anesthesia    severe confusion agitation requiring hospital admission x 4  days  . Compression fracture of fourth lumbar vertebra ([place]) 04/2019   - s/p Kyphoplasty; T12, L3-5 [facility])  . COPD (chronic obstructive pulmonary disease) (HCC)    Centrilobular Emphysema  . Coronary artery disease involving native coronary artery without angina pectoris 1998   - s/p CABG, occluded SVG-D1; patent LIMA-LAD, SVG-OM, SVG- RCA  . Dementia ([place])    memory loss - started noting late 2019  . Falls frequently    broke foot  . Fracture 2021   left foot  . GERD (gastroesophageal reflux disease)   . Hemorrhoid 02/10/2015  . History of hiatal hernia   . History of kidney stones   . HOH (hard of hearing)   . Hypertension   . Lewy body dementia ([place]) 04/04/2020  . Macular degeneration disease   . Osteoporosis   . PAF (paroxysmal atrial fibrillation) ([place])    on Eliquis for OAC - Rate Control   . Prostate cancer ([place])   . Prostate disorder   . Sciatic pain    Chronic  . Sleep apnea    cpap  . Temporary cerebral vascular dysfunction 02/10/2015   Had negative work-up.  July, 2012.  No medication changes, had  forgot them that day, got dehydrated.   . Urinary incontinence     Medications:  PTA Eliquis 5 mg BID - last dose 5/26 at 0900  Assessment: 87 y.o. male with medical history significant for Paroxysmal A. fib on Eliquis, COPD, HTN, CAD with history of CABG, OSA on CPAP and dementia who presents from home following an accidental fall in which she stepped back falling onto his left side with immediate pain in his left hip and left wrist. Pt planned to undergo intramedullary fixation of left intertrochanteric hip fracture as well as revision plating of left radius fracture 5/31. Pharmacy has been consulted for heparin dosing and monitoring. Pt takes Eliquis PTA - last dose noted to be 5/26 @0900 .  Hgb 11.3, Plt 158; all other baseline labs pending  Goal of Therapy:  Heparin level 0.3-0.7 units/ml once correlating with aPTT  aPTT 66-102 seconds Monitor platelets by anticoagulation protocol: Yes   Plan:   5/28: aPTT @ 1329 = 84, therapeutic X 1   5/28:  APTT @ 2049 = 75, therapeutic X 2 Will continue pt on current rate and recheck HL and aPTT on 5/29 with AM labs.   5/29 @ 0432:  APTT = 89, HL = 0.69 Will use HL to guide dosing now that aPTT and HL are therapeutic. Will recheck HL on 5/30 with AM labs.   Harinder Romas D, PharmD 01/27/2021 5:52 AM

## 2021-01-27 NOTE — Progress Notes (Signed)
PT family to be called prior to surgery due to reaction to medications during surgery- Daughter not sure which ones. SonArya Boxley- [phone].

## 2021-01-27 NOTE — Progress Notes (Signed)
  Subjective:  Patient reports pain as under control.  Objective:   VITALS:   Vitals:   01/26/21 1605 01/26/21 2040 01/27/21 0528 01/27/21 0808  BP: 104/67 98/62 121/76 119/68  Pulse: (!) 106 100 (!) 107 (!) 101  Resp: 18 16 20 20   Temp: 98.1 F (36.7 C) 98.8 F (37.1 C) 98.8 F (37.1 C) 98.6 F (37 C)  TempSrc:  Oral Oral   SpO2: 97% 100% 97% 95%  Weight:      Height:        Physical Exam:  General: alert, somewhat confused Left wrist in splint, NVI Left lower extremity NVI  LABS  Results for orders placed or performed during the hospital encounter of 01/24/21 (from the past 24 hour(s))  APTT     Status: Abnormal   Collection Time: 01/26/21  1:29 PM  Result Value Ref Range   aPTT 84 (H) 24 - 36 seconds  APTT     Status: Abnormal   Collection Time: 01/26/21  8:49 PM  Result Value Ref Range   aPTT 75 (H) 24 - 36 seconds  Heparin level (unfractionated)     Status: None   Collection Time: 01/27/21  4:32 AM  Result Value Ref Range   Heparin Unfractionated 0.69 0.30 - 0.70 IU/mL  CBC     Status: Abnormal   Collection Time: 01/27/21  4:32 AM  Result Value Ref Range   WBC 11.0 (H) 4.0 - 10.5 K/uL   RBC 2.87 (L) 4.22 - 5.81 MIL/uL   Hemoglobin 8.8 (L) 13.0 - 17.0 g/dL   HCT 27.1 (L) 39.0 - 52.0 %   MCV 94.4 80.0 - 100.0 fL   MCH 30.7 26.0 - 34.0 pg   MCHC 32.5 30.0 - 36.0 g/dL   RDW 16.5 (H) 11.5 - 15.5 %   Platelets 124 (L) 150 - 400 K/uL   nRBC 0.0 0.0 - 0.2 %  APTT     Status: Abnormal   Collection Time: 01/27/21  4:32 AM  Result Value Ref Range   aPTT 89 (H) 24 - 36 seconds   *Note: Due to a large number of results and/or encounters for the requested time period, some results have not been displayed. A complete set of results can be found in Results Review.    No results found.  Assessment/Plan:  1.  Left displaced radius fracture 2.  Left intertrochanteric hip fracture, displaced  - Plan for L hip IMN and revision left distal radius ORIF with Dr.  Mack Guise on Tuesday 5/31 - Continue to hold Eliquis - NPO Monday night at midnight     Renee Harder , MD 01/27/2021, 9:43 AM

## 2021-01-27 NOTE — Progress Notes (Signed)
IV obtained by bedside RN.

## 2021-01-28 DIAGNOSIS — S52502A Unspecified fracture of the lower end of left radius, initial encounter for closed fracture: Secondary | ICD-10-CM

## 2021-01-28 DIAGNOSIS — I10 Essential (primary) hypertension: Secondary | ICD-10-CM

## 2021-01-28 DIAGNOSIS — S72002A Fracture of unspecified part of neck of left femur, initial encounter for closed fracture: Secondary | ICD-10-CM | POA: Diagnosis not present

## 2021-01-28 LAB — TYPE AND SCREEN
ABO/RH(D): O POS
Antibody Screen: NEGATIVE

## 2021-01-28 LAB — MRSA PCR SCREENING: MRSA by PCR: NEGATIVE

## 2021-01-28 LAB — CBC
HCT: 25.5 % — ABNORMAL LOW (ref 39.0–52.0)
Hemoglobin: 8.5 g/dL — ABNORMAL LOW (ref 13.0–17.0)
MCH: 31.1 pg (ref 26.0–34.0)
MCHC: 33.3 g/dL (ref 30.0–36.0)
MCV: 93.4 fL (ref 80.0–100.0)
Platelets: 128 10*3/uL — ABNORMAL LOW (ref 150–400)
RBC: 2.73 MIL/uL — ABNORMAL LOW (ref 4.22–5.81)
RDW: 16.5 % — ABNORMAL HIGH (ref 11.5–15.5)
WBC: 9.9 10*3/uL (ref 4.0–10.5)
nRBC: 0 % (ref 0.0–0.2)

## 2021-01-28 LAB — HEPARIN LEVEL (UNFRACTIONATED): Heparin Unfractionated: 0.45 IU/mL (ref 0.30–0.70)

## 2021-01-28 MED ORDER — HEPARIN (PORCINE) 25000 UT/250ML-% IV SOLN
INTRAVENOUS | Status: AC
  Filled 2021-01-28: qty 250

## 2021-01-28 NOTE — Plan of Care (Signed)

## 2021-01-28 NOTE — Plan of Care (Signed)

## 2021-01-28 NOTE — Progress Notes (Signed)
[facility]  PROGRESS NOTE  [name]:[id number] DOB: 1933/12/20 DOA: 01/24/2021 PCP: [name], MD   Brief HPI:   87 year old male with medical history of paroxysmal atrial fibrillation on Eliquis, COPD, hypertension, CAD with history of CABG, OSA on CPAP, dementia presented from home following accidental fall in which she stepped back falling onto his left side with immediate pain in the left hip and left wrist.  He had no loss of consciousness, denies hitting head. Left wrist x-ray showed displaced fracture distal radial diaphysis. Pelvis and left hip x-ray showed mildly displaced intertrochanteric left hip fracture.  Nondisplaced fracture right inferior pubic ramus.  Subjective  Patient seen and examined, denies any complaints.    Assessment/Plan:     1. Left displaced intertrochanteric hip fracture-orthopedic surgery has been consulted, plan to perform intramedullary fixation on Tuesday.  Patient has been on Eliquis and will require at least 3 days of Eliquis washout before fracture can be fixed.  Management per orthopedics. 2. Paroxysmal atrial fibrillation-patient has history of atrial fibrillation, heart rate is well controlled.  We will continue with metoprolol 25 mg p.o. twice daily.  Eliquis is currently on hold due to above.  Orthopedics is okay with starting short acting anticoagulation.  We will start heparin per pharmacy till Monday midnight.  Patient has surgery scheduled for Tuesday. 3. Anemia-hemoglobin has dropped to 8.5, it was 11.3 on 01/24/2021.  No signs of bleeding.  Vitals are stable.  Will check FOBT.  We will hold heparin for now.  Follow CBC in a.m. 4. Hypertension-continue metoprolol, will hold lisinopril as blood pressure is not significantly elevated. 5. OSA on CPAP-continue home CPAP. 6. COPD-no acute exacerbation, continue home inhalers, DuoNebs as needed. 7. Dementia without behavior disturbance-continue Seroquel 8. CAD with history  of CABG-no recent complaints of chest pain or shortness of breath.  EKG is unremarkable.  Continue metoprolol. 9. Risk stratification-patient has history of CAD s/p CABG, EKG showed no acute abnormality.  He does not have chest pain or shortness of breath.  No further testing warranted at this time.  At this time patient would be high risk for surgery considering history of CAD and advanced age.  However benefit of surgery outweighs the risk involved considering advanced age and poor quality of life without hip repair.    Scheduled medications:   . acetaminophen  650 mg Oral Once  . feeding supplement  237 mL Oral BID BM  . metoprolol tartrate  25 mg Oral BID  . multivitamin with minerals  1 tablet Oral Daily         Data Reviewed:   CBG:  No results for input(s): GLUCAP in the last 168 hours.  SpO2: 98 %    Vitals:   01/27/21 2335 01/28/21 0431 01/28/21 0741 01/28/21 1110  BP: 112/69 105/65 132/71 118/67  Pulse: 91 (!) 58 92 84  Resp: 16 16 17 16   Temp: 97.7 F (36.5 C) (!) 97 F (36.1 C) 98.5 F (36.9 C) 98 F (36.7 C)  TempSrc:   Axillary Axillary  SpO2: 97% 100% 94% 98%  Weight:      Height:         Intake/Output Summary (Last 24 hours) at 01/28/2021 1154 Last data filed at 01/28/2021 0415 Gross per 24 hour  Intake 236 ml  Output 400 ml  Net -164 ml    05/28 1901 - 05/30 0700 In: 236 [I.V.:236] Out: 600 [Urine:600]  Filed Weights   01/24/21 2028 01/25/21 1429  Weight: 68.9 kg 66.2 kg    CBC:  Recent Labs  Lab 01/24/21 2041 01/26/21 0418 01/27/21 0432 01/28/21 0502  WBC 10.2 9.5 11.0* 9.9  HGB 11.3* 9.3* 8.8* 8.5*  HCT 33.9* 27.8* 27.1* 25.5*  PLT 158 121* 124* 128*  MCV 95.0 93.3 94.4 93.4  MCH 31.7 31.2 30.7 31.1  MCHC 33.3 33.5 32.5 33.3  RDW 15.5 16.3* 16.5* 16.5*  LYMPHSABS 1.9  --   --   --   MONOABS 0.9  --   --   --   EOSABS 0.2  --   --   --   BASOSABS 0.1  --   --   --     Complete metabolic panel:  Recent Labs  Lab  01/24/21 2041 01/25/21 1638  NA 139  --   K 4.1  --   CL 104  --   CO2 26  --   GLUCOSE 107*  --   BUN 16  --   CREATININE 0.87  --   CALCIUM 9.0  --   INR  --  1.6*    No results for input(s): LIPASE, AMYLASE in the last 168 hours.  Recent Labs  Lab 01/24/21 0143  SARSCOV2NAA NEGATIVE    ------------------------------------------------------------------------------------------------------------------ No results for input(s): CHOL, HDL, LDLCALC, TRIG, CHOLHDL, LDLDIRECT in the last 72 hours.  No results found for: HGBA1C ------------------------------------------------------------------------------------------------------------------ No results for input(s): TSH, T4TOTAL, T3FREE, THYROIDAB in the last 72 hours.  Invalid input(s): FREET3 ------------------------------------------------------------------------------------------------------------------ No results for input(s): VITAMINB12, FOLATE, FERRITIN, TIBC, IRON, RETICCTPCT in the last 72 hours.  Coagulation profile Recent Labs  Lab 01/25/21 1638  INR 1.6*   No results for input(s): DDIMER in the last 72 hours.  Cardiac Enzymes No results for input(s): CKTOTAL, CKMB, CKMBINDEX, TROPONINI in the last 168 hours.  ------------------------------------------------------------------------------------------------------------------    Component Value Date/Time   BNP 86.0 06/09/2016 1123     Antibiotics: Anti-infectives (From admission, onward)   Start     Dose/Rate Route Frequency Ordered Stop   01/29/21 0000  ceFAZolin (ANCEF) IVPB 2g/100 mL premix        2 g 200 mL/hr over 30 Minutes Intravenous 30 min pre-op 01/25/21 1546         Radiology Reports  No results found.    DVT prophylaxis: Lovenox  Code Status: Full code  Family Communication: Discussed with patient's son at bedside   Consultants:  Orthopedics  Procedures:      Objective    Physical Examination:    General-appears in  no acute distress  Heart-S1-S2, regular, no murmur auscultated  Lungs-clear to auscultation bilaterally, no wheezing or crackles auscultated  Abdomen-soft, nontender, no organomegaly  Extremities-no edema in the lower extremities  Neuro-alert, oriented x3, no focal deficit noted   Status is: Inpatient  Dispo: The patient is from: Home              Anticipated d/c is to: Skilled nursing facility              Anticipated d/c date is: 01/31/21              Patient currently not stable for discharge  Barrier to discharge-awaiting left hip repair  COVID-19 Labs  No results for input(s): DDIMER, FERRITIN, LDH, CRP in the last 72 hours.  Lab Results  Component Value Date   [place] NEGATIVE 01/24/2021   [place] NEGATIVE 09/27/2020   [place] NEGATIVE 01/11/2020   [place] NEGATIVE 12/26/2019    Microbiology  Recent  Results (from the past 240 hour(s))  Resp Panel by RT-PCR (Flu A&B, Covid) Nasopharyngeal Swab     Status: None   Collection Time: 01/24/21  1:43 AM   Specimen: Nasopharyngeal Swab; Nasopharyngeal(NP) swabs in vial transport medium  Result Value Ref Range Status   SARS Coronavirus 2 by RT PCR NEGATIVE NEGATIVE Final    Comment: (NOTE) SARS-CoV-2 target nucleic acids are NOT DETECTED.  The SARS-CoV-2 RNA is generally detectable in upper respiratory specimens during the acute phase of infection. The lowest concentration of SARS-CoV-2 viral copies this assay can detect is 138 copies/mL. A negative result does not preclude SARS-Cov-2 infection and should not be used as the sole basis for treatment or other patient management decisions. A negative result may occur with  improper specimen collection/handling, submission of specimen other than nasopharyngeal swab, presence of viral mutation(s) within the areas targeted by this assay, and inadequate number of viral copies(<138 copies/mL). A negative result must be combined with clinical observations,  patient history, and epidemiological information. The expected result is Negative.  Fact Sheet for Patients:  EntrepreneurPulse.com.au  Fact Sheet for Healthcare Providers:  IncredibleEmployment.be  This test is no t yet approved or cleared by the [place] FDA and  has been authorized for detection and/or diagnosis of SARS-CoV-2 by FDA under an Emergency Use Authorization (EUA). This EUA will remain  in effect (meaning this test can be used) for the duration of the COVID-19 declaration under Section 564(b)(1) of the Act, 21 U.S.C.section 360bbb-3(b)(1), unless the authorization is terminated  or revoked sooner.       Influenza A by PCR NEGATIVE NEGATIVE Final   Influenza B by PCR NEGATIVE NEGATIVE Final    Comment: (NOTE) The Xpert Xpress SARS-CoV-2/FLU/RSV plus assay is intended as an aid in the diagnosis of influenza from Nasopharyngeal swab specimens and should not be used as a sole basis for treatment. Nasal washings and aspirates are unacceptable for Xpert Xpress SARS-CoV-2/FLU/RSV testing.  Fact Sheet for Patients: EntrepreneurPulse.com.au  Fact Sheet for Healthcare Providers: IncredibleEmployment.be  This test is not yet approved or cleared by the [place] FDA and has been authorized for detection and/or diagnosis of SARS-CoV-2 by FDA under an Emergency Use Authorization (EUA). This EUA will remain in effect (meaning this test can be used) for the duration of the COVID-19 declaration under Section 564(b)(1) of the Act, 21 U.S.C. section 360bbb-3(b)(1), unless the authorization is terminated or revoked.  Performed at [facility], [address] [place], [place] [address]         [facility] If 7PM-7AM, please contact night-coverage at [email or web address], Office  [phone]   01/28/2021, 11:54 AM  LOS: 4 days

## 2021-01-28 NOTE — Progress Notes (Addendum)
[place] for heparin infusion Indication: atrial fibrillation  Allergies  Allergen Reactions  . Clonazepam Other (See Comments)    Altered mental status  . Codeine Other (See Comments)    Altered mental status.  . Ropinirole Swelling    Other reaction(s): Hallucination  . Hydrocodone     confusion  . Niacin And Related Other (See Comments)    Flushing of skin  . Oxycodone Other (See Comments)    confusion confusion  . Tramadol     Other reaction(s): Hallucination    Patient Measurements: Height: 6' (182.9 cm) Weight: 66.2 kg (145 lb 15.1 oz) (bed weight) IBW/kg (Calculated) : 77.6  Vital Signs: Temp: 97 F (36.1 C) (05/30 0431) BP: 105/65 (05/30 0431) Pulse Rate: 58 (05/30 0431)  Labs: Recent Labs    01/25/21 1638 01/26/21 0055 01/26/21 0418 01/26/21 1329 01/26/21 2049 01/27/21 0432 01/28/21 0502  HGB  --    < > 9.3*  --   --  8.8* 8.5*  HCT  --   --  27.8*  --   --  27.1* 25.5*  PLT  --   --  121*  --   --  124* 128*  APTT 43*   < >  --  84* 75* 89*  --   LABPROT 19.1*  --   --   --   --   --   --   INR 1.6*  --   --   --   --   --   --   HEPARINUNFRC >1.10*  --  >1.10*  --   --  0.69 0.45   < > = values in this interval not displayed.    Estimated Creatinine Clearance: 58.1 mL/min (by C-G formula based on SCr of 0.87 mg/dL).   Medical History: Past Medical History:  Diagnosis Date  . AAA (abdominal aortic aneurysm) ([place]) 05/2019   Relativley Rapid progression from June-Aug 2020 (4.6 - 5.4 cm): s/p EVAR (Dr. Lucky Cowboy [facility]): EVAR - 23 mm prox, 12 cm distal & x 18 cm length Gore Excluder Endoprosthesis Main body (via RFA distal to lowest Renal A); 70mm x 14 cm L Contralateral Limb for L Iliac - extended with 8 mm x 6cm LifeStar stent (post-dilated with 7 mm DEB).  Additional R Iliac PTA not required.   . 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 ([place]) 09/15/2017   09-15-2017 DIAGNOSIS:  A. COLON POLYP X 2, ASCENDING; COLD SNARE:  -  TUBULAR ADENOMA.  - NEGATIVE FOR HIGH-GRADE DYSPLASIA AND MALIGNANCY.  - NO ADDITIONAL TISSUE IDENTIFIED; FECAL MATERIAL PRESENT.   B. COLON POLYP X 2, DESCENDING; COLD SNARE:  - ADENOCARCINOMA ARISING IN A DYSPLASTIC SESSILE SERRATED ADENOMA, WITH  LYMPHOVASCULAR INVASION, SEE COMMENT.  - TUBULAR ADENOMA, 2 FRAGMENTS, NEGATIVE FO  . Arthritis   . Basal cell carcinoma of eyelid 01/03/2013   2019 - R side of Nose  . Benign neoplasm of ascending colon   . Benign neoplasm of descending colon   . Bilateral iliac artery stenosis ([place]) 2007   Bilateral Iliac A stenting; extension of EVAR limbs into both Iliacs -- additional stent placed in L Iliac.  . Cancer ([place])    Skin cancer  . Colon polyps   . Complication of anesthesia    severe confusion agitation requiring hospital admission x 4 days  . Compression fracture of fourth lumbar vertebra ([place]) 04/2019   - s/p Kyphoplasty; T12, L3-5 [facility])  . COPD (chronic obstructive pulmonary disease) ([place])  Centrilobular Emphysema  . Coronary artery disease involving native coronary artery without angina pectoris 1998   - s/p CABG, occluded SVG-D1; patent LIMA-LAD, SVG-OM, SVG- RCA  . Dementia ([place])    memory loss - started noting late 2019  . Falls frequently    broke foot  . Fracture 2021   left foot  . GERD (gastroesophageal reflux disease)   . Hemorrhoid 02/10/2015  . History of hiatal hernia   . History of kidney stones   . HOH (hard of hearing)   . Hypertension   . Lewy body dementia ([place]) 04/04/2020  . Macular degeneration disease   . Osteoporosis   . PAF (paroxysmal atrial fibrillation) ([place])    on Eliquis for OAC - Rate Control   . Prostate cancer ([place])   . Prostate disorder   . Sciatic pain    Chronic  . Sleep apnea    cpap  . Temporary cerebral vascular dysfunction 02/10/2015   Had negative work-up.  July, 2012.  No medication changes, had forgot them that day, got dehydrated.   . Urinary incontinence     Medications:  PTA  Eliquis 5 mg BID - last dose 5/26 at 0900  Assessment: 87 y.o. male with medical history significant for Paroxysmal A. fib on Eliquis, COPD, HTN, CAD with history of CABG, OSA on CPAP and dementia who presents from home following an accidental fall in which she stepped back falling onto his left side with immediate pain in his left hip and left wrist. Pt planned to undergo intramedullary fixation of left intertrochanteric hip fracture as well as revision plating of left radius fracture 5/31. Pharmacy has been consulted for heparin dosing and monitoring. Pt takes Eliquis PTA - last dose noted to be 5/26 @0900 .  Hgb 11.3, Plt 158; all other baseline labs pending  5/28: aPTT @ 1329 = 84, therapeutic X 1  5/28:  APTT @ 2049 = 75, therapeutic X 2 5/29 @ 0432:  APTT = 89, HL = 0.69  Goal of Therapy:  Heparin level 0.3-0.7 units/ml once correlating with aPTT  aPTT 66-102 seconds Monitor platelets by anticoagulation protocol: Yes   Plan:  5/30 @ 0502 HL 0.45. Level remains therapeutic. Hgb down to 8.5 this morning, platelets are stable. Continue current infusion rate of 750 units/hr.    Will continue to recheck HL and CBC with AM labs daily.   Pernell Dupre, PharmD, BCPS Clinical Pharmacist 01/28/2021 7:37 AM

## 2021-01-28 NOTE — TOC Progression Note (Signed)
Transition of Care [facility]) - Progression Note    Patient Details  Name: [name] MRN: [id number] Date of Birth: Feb 10, 1934  Transition of Care [facility]) CM/SW [place], RN Phone Number: 01/28/2021, 10:25 AM  Clinical Narrative:   Daughter at bedside, patient remains oriented to name only.  Daughter open to SNF if recommended.  Surgery planned for tomorrow.  TOC contact information given, TOC to follow thorough discharge.           Expected Discharge Plan and Services                                                 Social Determinants of Health (SDOH) Interventions    Readmission Risk Interventions Readmission Risk Prevention Plan 12/28/2019 05/19/2019  Transportation Screening Complete Complete  HRI or [place] - Complete  Social Work Consult for [place] Planning/Counseling - Complete  Palliative Care Screening - Not Applicable  Medication Review Press photographer) Complete Complete  HRI or Home Care Consult Complete -  [place] Complete -  Some recent data might be hidden

## 2021-01-29 ENCOUNTER — Inpatient Hospital Stay: Payer: Medicare Other

## 2021-01-29 ENCOUNTER — Inpatient Hospital Stay: Payer: Medicare Other | Admitting: Anesthesiology

## 2021-01-29 ENCOUNTER — Encounter
Admission: EM | Disposition: A | Discharge status: Skilled Nursing Facility | Payer: Self-pay | Source: Home / Self Care | Attending: Internal Medicine

## 2021-01-29 ENCOUNTER — Encounter: Payer: Self-pay | Admitting: Internal Medicine

## 2021-01-29 DIAGNOSIS — S52592D Other fractures of lower end of left radius, subsequent encounter for closed fracture with routine healing: Secondary | ICD-10-CM | POA: Diagnosis not present

## 2021-01-29 DIAGNOSIS — S72002A Fracture of unspecified part of neck of left femur, initial encounter for closed fracture: Secondary | ICD-10-CM | POA: Diagnosis not present

## 2021-01-29 DIAGNOSIS — W19XXXD Unspecified fall, subsequent encounter: Secondary | ICD-10-CM | POA: Diagnosis not present

## 2021-01-29 HISTORY — PX: INTRAMEDULLARY (IM) NAIL INTERTROCHANTERIC: SHX5875

## 2021-01-29 HISTORY — PX: OPEN REDUCTION INTERNAL FIXATION (ORIF) DISTAL RADIAL FRACTURE: SHX5989

## 2021-01-29 HISTORY — PX: INCISION AND DRAINAGE OF WOUND: SHX1803

## 2021-01-29 LAB — CBC
HCT: 24.9 % — ABNORMAL LOW (ref 39.0–52.0)
Hemoglobin: 8.4 g/dL — ABNORMAL LOW (ref 13.0–17.0)
MCH: 31.9 pg (ref 26.0–34.0)
MCHC: 33.7 g/dL (ref 30.0–36.0)
MCV: 94.7 fL (ref 80.0–100.0)
Platelets: 150 10*3/uL (ref 150–400)
RBC: 2.63 MIL/uL — ABNORMAL LOW (ref 4.22–5.81)
RDW: 16.5 % — ABNORMAL HIGH (ref 11.5–15.5)
WBC: 10.4 10*3/uL (ref 4.0–10.5)
nRBC: 0 % (ref 0.0–0.2)

## 2021-01-29 LAB — TYPE AND SCREEN
ABO/RH(D): O POS
Antibody Screen: NEGATIVE

## 2021-01-29 SURGERY — FIXATION, FRACTURE, INTERTROCHANTERIC, WITH INTRAMEDULLARY ROD
Anesthesia: General | Laterality: Left

## 2021-01-29 MED ORDER — SUGAMMADEX SODIUM 200 MG/2ML IV SOLN
INTRAVENOUS | Status: DC | PRN
  Administered 2021-01-29: 200 mg via INTRAVENOUS

## 2021-01-29 MED ORDER — POLYETHYL GLYCOL-PROPYL GLYCOL 0.4-0.3 % OP SOLN: 1.0000 [drp] | Freq: Every day | OPHTHALMIC | Status: DC

## 2021-01-29 MED ORDER — DOCUSATE SODIUM 100 MG PO CAPS
100.0000 mg | ORAL_CAPSULE | Freq: Two times a day (BID) | ORAL | Status: DC
  Administered 2021-01-30 – 2021-02-04 (×8): 100 mg via ORAL
  Filled 2021-01-29 (×10): qty 1

## 2021-01-29 MED ORDER — POLYVINYL ALCOHOL 1.4 % OP SOLN
1.0000 [drp] | Freq: Every day | OPHTHALMIC | Status: DC
  Administered 2021-01-29 – 2021-02-03 (×5): 1 [drp] via OPHTHALMIC
  Filled 2021-01-29 (×2): qty 15

## 2021-01-29 MED ORDER — FENTANYL CITRATE (PF) 100 MCG/2ML IJ SOLN
INTRAMUSCULAR | Status: AC
  Filled 2021-01-29: qty 2

## 2021-01-29 MED ORDER — EZETIMIBE 10 MG PO TABS
10.0000 mg | ORAL_TABLET | Freq: Every day | ORAL | Status: DC
  Administered 2021-01-30 – 2021-02-04 (×5): 10 mg via ORAL
  Filled 2021-01-29 (×7): qty 1

## 2021-01-29 MED ORDER — POLYETHYLENE GLYCOL 3350 17 G PO PACK: 17.0000 g | PACK | Freq: Every day | ORAL | Status: DC | PRN

## 2021-01-29 MED ORDER — MENTHOL 3 MG MT LOZG
1.0000 | LOZENGE | OROMUCOSAL | Status: DC | PRN
  Filled 2021-01-29: qty 9

## 2021-01-29 MED ORDER — ACETAMINOPHEN 10 MG/ML IV SOLN
INTRAVENOUS | Status: AC
  Filled 2021-01-29: qty 100

## 2021-01-29 MED ORDER — APIXABAN 5 MG PO TABS
5.0000 mg | ORAL_TABLET | Freq: Two times a day (BID) | ORAL | Status: DC
  Administered 2021-01-30 – 2021-02-04 (×9): 5 mg via ORAL
  Filled 2021-01-29 (×11): qty 1

## 2021-01-29 MED ORDER — LORAZEPAM 0.5 MG PO TABS
0.5000 mg | ORAL_TABLET | Freq: Three times a day (TID) | ORAL | Status: DC | PRN
  Administered 2021-02-01: 0.5 mg via ORAL
  Filled 2021-01-29: qty 1

## 2021-01-29 MED ORDER — ONDANSETRON HCL 4 MG/2ML IJ SOLN
INTRAMUSCULAR | Status: DC | PRN
  Administered 2021-01-29: 4 mg via INTRAVENOUS

## 2021-01-29 MED ORDER — BACITRACIN ZINC 500 UNIT/GM EX OINT
1.0000 "application " | TOPICAL_OINTMENT | Freq: Every day | CUTANEOUS | Status: DC
  Administered 2021-01-30 – 2021-02-02 (×3): 1 via TOPICAL
  Filled 2021-01-29 (×4): qty 0.9

## 2021-01-29 MED ORDER — DONEPEZIL HCL 5 MG PO TABS
10.0000 mg | ORAL_TABLET | Freq: Every day | ORAL | Status: DC
  Administered 2021-01-30 – 2021-02-03 (×4): 10 mg via ORAL
  Filled 2021-01-29 (×5): qty 2

## 2021-01-29 MED ORDER — LIDOCAINE HCL (CARDIAC) PF 100 MG/5ML IV SOSY
PREFILLED_SYRINGE | INTRAVENOUS | Status: DC | PRN
  Administered 2021-01-29: 100 mg via INTRAVENOUS

## 2021-01-29 MED ORDER — MIRABEGRON ER 50 MG PO TB24
50.0000 mg | ORAL_TABLET | Freq: Every day | ORAL | Status: DC
  Administered 2021-01-30 – 2021-02-04 (×5): 50 mg via ORAL
  Filled 2021-01-29 (×7): qty 1

## 2021-01-29 MED ORDER — ACETAMINOPHEN 10 MG/ML IV SOLN
INTRAVENOUS | Status: DC | PRN
  Administered 2021-01-29: 1000 mg via INTRAVENOUS

## 2021-01-29 MED ORDER — SODIUM CHLORIDE 0.9 % IV SOLN
INTRAVENOUS | Status: DC | PRN
  Administered 2021-01-29: 25 ug/min via INTRAVENOUS

## 2021-01-29 MED ORDER — SENNA 8.6 MG PO TABS
1.0000 | ORAL_TABLET | Freq: Two times a day (BID) | ORAL | Status: DC
  Administered 2021-01-30 – 2021-02-04 (×8): 8.6 mg via ORAL
  Filled 2021-01-29 (×10): qty 1

## 2021-01-29 MED ORDER — ONDANSETRON HCL 4 MG PO TABS: 4.0000 mg | ORAL_TABLET | Freq: Four times a day (QID) | ORAL | Status: DC | PRN

## 2021-01-29 MED ORDER — PROPOFOL 1000 MG/100ML IV EMUL
INTRAVENOUS | Status: AC
  Filled 2021-01-29: qty 100

## 2021-01-29 MED ORDER — MAGNESIUM OXIDE -MG SUPPLEMENT 400 (240 MG) MG PO TABS
400.0000 mg | ORAL_TABLET | Freq: Every evening | ORAL | Status: DC
  Administered 2021-01-30 – 2021-02-03 (×4): 400 mg via ORAL
  Filled 2021-01-29 (×5): qty 1

## 2021-01-29 MED ORDER — PROPOFOL 10 MG/ML IV BOLUS
INTRAVENOUS | Status: AC
  Filled 2021-01-29: qty 40

## 2021-01-29 MED ORDER — NEOMYCIN-POLYMYXIN B GU 40-200000 IR SOLN
Status: AC
  Filled 2021-01-29: qty 2

## 2021-01-29 MED ORDER — PROPOFOL 500 MG/50ML IV EMUL
INTRAVENOUS | Status: DC | PRN
  Administered 2021-01-29: 50 ug/kg/min via INTRAVENOUS

## 2021-01-29 MED ORDER — CEFAZOLIN SODIUM-DEXTROSE 2-4 GM/100ML-% IV SOLN
INTRAVENOUS | Status: AC
  Filled 2021-01-29: qty 100

## 2021-01-29 MED ORDER — ONDANSETRON HCL 4 MG/2ML IJ SOLN: 4.0000 mg | Freq: Once | INTRAMUSCULAR | Status: DC | PRN

## 2021-01-29 MED ORDER — LISINOPRIL 5 MG PO TABS
2.5000 mg | ORAL_TABLET | Freq: Two times a day (BID) | ORAL | Status: DC
  Administered 2021-01-30 – 2021-02-04 (×7): 2.5 mg via ORAL
  Filled 2021-01-29 (×11): qty 1

## 2021-01-29 MED ORDER — EPHEDRINE SULFATE 50 MG/ML IJ SOLN
INTRAMUSCULAR | Status: DC | PRN
  Administered 2021-01-29: 5 mg via INTRAVENOUS

## 2021-01-29 MED ORDER — MAGNESIUM CITRATE PO SOLN
1.0000 | Freq: Once | ORAL | Status: DC | PRN
  Filled 2021-01-29: qty 296

## 2021-01-29 MED ORDER — ROCURONIUM BROMIDE 100 MG/10ML IV SOLN
INTRAVENOUS | Status: DC | PRN
  Administered 2021-01-29: 40 mg via INTRAVENOUS
  Administered 2021-01-29: 10 mg via INTRAVENOUS

## 2021-01-29 MED ORDER — FENTANYL CITRATE (PF) 100 MCG/2ML IJ SOLN
INTRAMUSCULAR | Status: DC | PRN
  Administered 2021-01-29 (×4): 25 ug via INTRAVENOUS

## 2021-01-29 MED ORDER — MORPHINE SULFATE (PF) 2 MG/ML IV SOLN: 0.5000 mg | INTRAVENOUS | Status: DC | PRN

## 2021-01-29 MED ORDER — FINASTERIDE 5 MG PO TABS
5.0000 mg | ORAL_TABLET | Freq: Every day | ORAL | Status: DC
  Administered 2021-01-30 – 2021-02-04 (×5): 5 mg via ORAL
  Filled 2021-01-29 (×6): qty 1

## 2021-01-29 MED ORDER — HYDROCODONE-ACETAMINOPHEN 5-325 MG PO TABS
1.0000 | ORAL_TABLET | ORAL | Status: DC | PRN
  Administered 2021-01-31: 1 via ORAL
  Filled 2021-01-29: qty 1

## 2021-01-29 MED ORDER — DEXAMETHASONE SODIUM PHOSPHATE 10 MG/ML IJ SOLN
INTRAMUSCULAR | Status: DC | PRN
  Administered 2021-01-29: 10 mg via INTRAVENOUS

## 2021-01-29 MED ORDER — PROPOFOL 10 MG/ML IV BOLUS
INTRAVENOUS | Status: DC | PRN
  Administered 2021-01-29: 130 mg via INTRAVENOUS

## 2021-01-29 MED ORDER — TRAZODONE HCL 50 MG PO TABS
50.0000 mg | ORAL_TABLET | Freq: Every day | ORAL | Status: DC
  Administered 2021-01-31 – 2021-02-03 (×3): 50 mg via ORAL
  Filled 2021-01-29 (×3): qty 1

## 2021-01-29 MED ORDER — METHOCARBAMOL 500 MG PO TABS
500.0000 mg | ORAL_TABLET | Freq: Four times a day (QID) | ORAL | Status: DC | PRN
  Filled 2021-01-29: qty 1

## 2021-01-29 MED ORDER — ACETAMINOPHEN 325 MG PO TABS
325.0000 mg | ORAL_TABLET | Freq: Four times a day (QID) | ORAL | Status: DC | PRN
  Administered 2021-02-01: 650 mg via ORAL
  Administered 2021-02-03: 325 mg via ORAL
  Administered 2021-02-04: 650 mg via ORAL
  Filled 2021-01-29 (×3): qty 2

## 2021-01-29 MED ORDER — ROSUVASTATIN CALCIUM 10 MG PO TABS
20.0000 mg | ORAL_TABLET | Freq: Every day | ORAL | Status: DC
  Administered 2021-01-30 – 2021-02-04 (×5): 20 mg via ORAL
  Filled 2021-01-29 (×6): qty 2

## 2021-01-29 MED ORDER — ALUM & MAG HYDROXIDE-SIMETH 200-200-20 MG/5ML PO SUSP: 30.0000 mL | ORAL | Status: DC | PRN

## 2021-01-29 MED ORDER — METHOCARBAMOL 1000 MG/10ML IJ SOLN
500.0000 mg | Freq: Four times a day (QID) | INTRAVENOUS | Status: DC | PRN
  Filled 2021-01-29: qty 5

## 2021-01-29 MED ORDER — MIRTAZAPINE 15 MG PO TABS
7.5000 mg | ORAL_TABLET | Freq: Every day | ORAL | Status: DC
  Administered 2021-01-30 – 2021-02-03 (×4): 7.5 mg via ORAL
  Filled 2021-01-29 (×6): qty 1

## 2021-01-29 MED ORDER — ONDANSETRON HCL 4 MG/2ML IJ SOLN: 4.0000 mg | Freq: Four times a day (QID) | INTRAMUSCULAR | Status: DC | PRN

## 2021-01-29 MED ORDER — NEOMYCIN-POLYMYXIN B GU 40-200000 IR SOLN
Status: DC | PRN
  Administered 2021-01-29: 2 mL

## 2021-01-29 MED ORDER — SODIUM CHLORIDE 0.9 % IR SOLN
Status: DC | PRN
  Administered 2021-01-29: 2 mL

## 2021-01-29 MED ORDER — FENTANYL CITRATE (PF) 100 MCG/2ML IJ SOLN: 25.0000 ug | INTRAMUSCULAR | Status: DC | PRN

## 2021-01-29 MED ORDER — ACETAMINOPHEN 500 MG PO TABS: 500.0000 mg | ORAL_TABLET | Freq: Four times a day (QID) | ORAL | Status: AC

## 2021-01-29 MED ORDER — BISACODYL 10 MG RE SUPP: 10.0000 mg | Freq: Every day | RECTAL | Status: DC | PRN

## 2021-01-29 MED ORDER — LACTATED RINGERS IV SOLN: INTRAVENOUS | Status: DC | PRN

## 2021-01-29 MED ORDER — FLUTICASONE PROPIONATE 50 MCG/ACT NA SUSP
2.0000 | Freq: Every day | NASAL | Status: DC
  Administered 2021-01-30 – 2021-02-04 (×3): 2 via NASAL
  Filled 2021-01-29: qty 16

## 2021-01-29 MED ORDER — CEFAZOLIN SODIUM-DEXTROSE 2-4 GM/100ML-% IV SOLN
2.0000 g | Freq: Four times a day (QID) | INTRAVENOUS | Status: AC
  Administered 2021-01-29 (×2): 2 g via INTRAVENOUS
  Filled 2021-01-29 (×3): qty 100

## 2021-01-29 MED ORDER — PHENOL 1.4 % MT LIQD
1.0000 | OROMUCOSAL | Status: DC | PRN
  Filled 2021-01-29: qty 177

## 2021-01-29 MED ORDER — PHENYLEPHRINE HCL (PRESSORS) 10 MG/ML IV SOLN
INTRAVENOUS | Status: DC | PRN
  Administered 2021-01-29: 200 ug via INTRAVENOUS

## 2021-01-29 MED ORDER — VITAMIN D 25 MCG (1000 UNIT) PO TABS
2000.0000 [IU] | ORAL_TABLET | Freq: Every day | ORAL | Status: DC
  Administered 2021-01-30 – 2021-02-04 (×4): 2000 [IU] via ORAL
  Filled 2021-01-29 (×5): qty 2

## 2021-01-29 SURGICAL SUPPLY — 90 items
BIT DRILL 2 FAST STEP (BIT) ×1 IMPLANT
BIT DRILL 2.5X4 QC (BIT) ×1 IMPLANT
BIT DRILL 4.3MMS DISTAL GRDTED (BIT) IMPLANT
BNDG CMPR STD VLCR NS LF 5.8X3 (GAUZE/BANDAGES/DRESSINGS) ×2
BNDG CMPR STD VLCR NS LF 5.8X4 (GAUZE/BANDAGES/DRESSINGS) ×1
BNDG COHESIVE 4X5 TAN STRL (GAUZE/BANDAGES/DRESSINGS) ×2 IMPLANT
BNDG COHESIVE 6X5 TAN STRL LF (GAUZE/BANDAGES/DRESSINGS) ×4 IMPLANT
BNDG ELASTIC 3X5.8 VLCR NS LF (GAUZE/BANDAGES/DRESSINGS) ×4 IMPLANT
BNDG ELASTIC 4X5.8 VLCR NS LF (GAUZE/BANDAGES/DRESSINGS) ×2 IMPLANT
BNDG ELASTIC 4X5.8 VLCR STR LF (GAUZE/BANDAGES/DRESSINGS) ×4 IMPLANT
BNDG ESMARK 4X12 TAN STRL LF (GAUZE/BANDAGES/DRESSINGS) ×2 IMPLANT
BNDG PLASTER FAST 3X3 WHT LF (CAST SUPPLIES) ×4 IMPLANT
BNDG PLSTR 3X3 XFST ST WHT LF (CAST SUPPLIES) ×2
CORD BIP STRL DISP 12FT (MISCELLANEOUS) ×2 IMPLANT
CORTICAL BONE SCR 5.0MM X 48MM (Screw) ×2 IMPLANT
COVER WAND RF STERILE (DRAPES) ×2 IMPLANT
CUFF TOURN SGL QUICK 18X4 (TOURNIQUET CUFF) ×1 IMPLANT
DRAPE 3/4 80X56 (DRAPES) ×4 IMPLANT
DRAPE FLUOR MINI C-ARM 54X84 (DRAPES) ×2 IMPLANT
DRAPE SURG 17X11 SM STRL (DRAPES) ×4 IMPLANT
DRAPE U-SHAPE 47X51 STRL (DRAPES) ×2 IMPLANT
DRILL 4.3MMS DISTAL GRADUATED (BIT) ×2
DRSG GAUZE FLUFF 36X18 (GAUZE/BANDAGES/DRESSINGS) ×2 IMPLANT
DRSG OPSITE POSTOP 3X4 (GAUZE/BANDAGES/DRESSINGS) ×2 IMPLANT
DRSG OPSITE POSTOP 4X14 (GAUZE/BANDAGES/DRESSINGS) IMPLANT
DRSG OPSITE POSTOP 4X6 (GAUZE/BANDAGES/DRESSINGS) ×4 IMPLANT
DURAPREP 26ML APPLICATOR (WOUND CARE) ×4 IMPLANT
ELECT REM PT RETURN 9FT ADLT (ELECTROSURGICAL) ×2
ELECTRODE REM PT RTRN 9FT ADLT (ELECTROSURGICAL) ×1 IMPLANT
FORCEPS JEWEL BIP 4-3/4 STR (INSTRUMENTS) ×2 IMPLANT
GAUZE SPONGE 4X4 12PLY STRL (GAUZE/BANDAGES/DRESSINGS) ×2 IMPLANT
GAUZE XEROFORM 1X8 LF (GAUZE/BANDAGES/DRESSINGS) ×3 IMPLANT
GLOVE SURG ENC MOIS LTX SZ7.5 (GLOVE) ×2 IMPLANT
GLOVE SURG ORTHO LTX SZ9 (GLOVE) ×4 IMPLANT
GLOVE SURG UNDER LTX SZ7.5 (GLOVE) ×2 IMPLANT
GLOVE SURG UNDER POLY LF SZ9 (GLOVE) ×2 IMPLANT
GOWN STRL REUS TWL 2XL XL LVL4 (GOWN DISPOSABLE) ×2 IMPLANT
GOWN STRL REUS W/ TWL LRG LVL3 (GOWN DISPOSABLE) ×1 IMPLANT
GOWN STRL REUS W/TWL LRG LVL3 (GOWN DISPOSABLE) ×2
GUIDEPIN VERSANAIL DSP 3.2X444 (ORTHOPEDIC DISPOSABLE SUPPLIES) ×1 IMPLANT
GUIDEWIRE BALL NOSE 100CM (WIRE) ×1 IMPLANT
GUIDEWIRE ORTH 6X062XTROC NS (WIRE) IMPLANT
HEMOVAC 400CC 10FR (MISCELLANEOUS) IMPLANT
K-WIRE .062 (WIRE) ×4
KIT TURNOVER CYSTO (KITS) ×2 IMPLANT
KIT TURNOVER KIT A (KITS) ×2 IMPLANT
MANIFOLD NEPTUNE II (INSTRUMENTS) ×2 IMPLANT
MAT ABSORB  FLUID 56X50 GRAY (MISCELLANEOUS) ×2
MAT ABSORB FLUID 56X50 GRAY (MISCELLANEOUS) ×1 IMPLANT
NAIL HIP FX 11MMX42CM (Nail) ×1 IMPLANT
NDL FILTER BLUNT 18X1 1/2 (NEEDLE) ×1 IMPLANT
NEEDLE FILTER BLUNT 18X 1/2SAF (NEEDLE) ×1
NEEDLE FILTER BLUNT 18X1 1/2 (NEEDLE) ×1 IMPLANT
NS IRRIG 1000ML POUR BTL (IV SOLUTION) ×2 IMPLANT
NS IRRIG 500ML POUR BTL (IV SOLUTION) ×2 IMPLANT
PACK EXTREMITY ARMC (MISCELLANEOUS) ×2 IMPLANT
PACK HIP COMPR (MISCELLANEOUS) ×2 IMPLANT
PAD ARMBOARD 7.5X6 YLW CONV (MISCELLANEOUS) ×2 IMPLANT
PAD CAST CTTN 4X4 STRL (SOFTGOODS) ×2 IMPLANT
PAD PREP 24X41 OB/GYN DISP (PERSONAL CARE ITEMS) ×2 IMPLANT
PADDING CAST COTTON 4X4 STRL (SOFTGOODS) ×4
PEG THREADED 2.5MMX18MM LONG (Peg) ×1 IMPLANT
PEG THREADED 2.5MMX26MM LONG (Peg) ×1 IMPLANT
PLATE XLONG 24.4X89.5 LT (Plate) ×1 IMPLANT
SCREW BN 12X3.5XNS CORT TI (Screw) IMPLANT
SCREW BONE CORTICAL 5.0X52 (Screw) ×1 IMPLANT
SCREW CORT 3.5X12 (Screw) ×2 IMPLANT
SCREW CORT 3.5X16 LNG (Screw) ×1 IMPLANT
SCREW CORT 3.5X18 LNG (Screw) ×1 IMPLANT
SCREW CORTICL BON 5.0MM X 48MM (Screw) IMPLANT
SCREW LAG 10.5MMX105MM HFN (Screw) ×1 IMPLANT
SLING ARM LRG DEEP (SOFTGOODS) ×1 IMPLANT
SPLINT CAST 1 STEP 4X30 (MISCELLANEOUS) ×2 IMPLANT
STAPLER SKIN PROX 35W (STAPLE) ×2 IMPLANT
STOCKINETTE 48X4 2 PLY STRL (GAUZE/BANDAGES/DRESSINGS) ×1 IMPLANT
STOCKINETTE STRL 4IN 9604848 (GAUZE/BANDAGES/DRESSINGS) ×2 IMPLANT
STRIP CLOSURE SKIN 1/2X4 (GAUZE/BANDAGES/DRESSINGS) ×2 IMPLANT
SUCTION FRAZIER HANDLE 10FR (MISCELLANEOUS) ×2
SUCTION TUBE FRAZIER 10FR DISP (MISCELLANEOUS) ×1 IMPLANT
SUT MNCRL AB 4-0 PS2 18 (SUTURE) ×2 IMPLANT
SUT VIC AB 0 CT1 36 (SUTURE) ×2 IMPLANT
SUT VIC AB 0 CT2 27 (SUTURE) ×3 IMPLANT
SUT VIC AB 2-0 CT1 27 (SUTURE) ×2
SUT VIC AB 2-0 CT1 TAPERPNT 27 (SUTURE) ×1 IMPLANT
SUT VIC AB 3-0 SH 27 (SUTURE) ×4
SUT VIC AB 3-0 SH 27X BRD (SUTURE) ×1 IMPLANT
SUT VICRYL 0 AB UR-6 (SUTURE) ×2 IMPLANT
SYR 10ML LL (SYRINGE) ×2 IMPLANT
SYR 30ML LL (SYRINGE) ×2 IMPLANT
TAPE TRANSPORE STRL 2 31045 (GAUZE/BANDAGES/DRESSINGS) ×2 IMPLANT

## 2021-01-29 NOTE — Anesthesia Preprocedure Evaluation (Addendum)
Anesthesia Evaluation  Patient identified by MRN, date of birth, ID band Patient awake and Patient confused    Reviewed: Allergy & Precautions, NPO status , Patient's Chart, lab work & pertinent test results  History of Anesthesia Complications (+) Emergence Delirium and history of anesthetic complications  Airway Mallampati: III       Dental   Pulmonary sleep apnea and Continuous Positive Airway Pressure Ventilation , COPD, Patient abstained from smoking., former smoker,           Cardiovascular hypertension, Pt. on medications + CAD and + Peripheral Vascular Disease  + dysrhythmias Atrial Fibrillation (-) Valvular Problems/Murmurs     Neuro/Psych neg Seizures Dementia    GI/Hepatic Neg liver ROS, hiatal hernia, GERD  Controlled,  Endo/Other  neg diabetesHypothyroidism   Renal/GU Renal disease (strones)     Musculoskeletal   Abdominal   Peds  Hematology  (+) anemia ,   Anesthesia Other Findings   Reproductive/Obstetrics                            Anesthesia Physical Anesthesia Plan  ASA: III  Anesthesia Plan: General   Post-op Pain Management:    Induction: Intravenous  PONV Risk Score and Plan: Ondansetron  Airway Management Planned: Oral ETT  Additional Equipment:   Intra-op Plan:   Post-operative Plan:   Informed Consent: I have reviewed the patients History and Physical, chart, labs and discussed the procedure including the risks, benefits and alternatives for the proposed anesthesia with the patient or authorized representative who has indicated his/her understanding and acceptance.       Plan Discussed with:   Anesthesia Plan Comments:         Anesthesia Quick Evaluation

## 2021-01-29 NOTE — Anesthesia Procedure Notes (Signed)
Procedure Name: Intubation Performed by: Fletcher-Harrison, Jamell Laymon, CRNA Pre-anesthesia Checklist: Patient identified, Emergency Drugs available, Suction available and Patient being monitored Patient Re-evaluated:Patient Re-evaluated prior to induction Oxygen Delivery Method: Circle system utilized Preoxygenation: Pre-oxygenation with 100% oxygen Induction Type: IV induction Ventilation: Mask ventilation without difficulty Laryngoscope Size: McGraph and 3 Grade View: Grade I Tube type: Oral Tube size: 6.5 mm Number of attempts: 1 Airway Equipment and Method: Stylet and Oral airway Placement Confirmation: ETT inserted through vocal cords under direct vision,  positive ETCO2,  breath sounds checked- equal and bilateral and CO2 detector Secured at: 21 cm Tube secured with: Tape Dental Injury: Teeth and Oropharynx as per pre-operative assessment        

## 2021-01-29 NOTE — H&P (Signed)
The patient has been re-examined, and the chart reviewed, and there have been no interval changes to the documented history and physical.    The risks, benefits, and alternatives have been discussed at length with patient's son, and he is willing to proceed.

## 2021-01-29 NOTE — Transfer of Care (Signed)
Immediate Anesthesia Transfer of Care Note  Patient: [name]  Procedure(s) Performed: INTRAMEDULLARY (IM) NAIL INTERTROCHANTRIC (Left ) OPEN REDUCTION INTERNAL FIXATION (ORIF) DISTAL RADIAL FRACTURE (Left ) IRRIGATION AND DEBRIDEMENT Left Wrist and Removal of Plate (Left )  Patient Location: PACU  Anesthesia Type:General  Level of Consciousness: awake, patient cooperative and confused  Airway & Oxygen Therapy: Patient Spontanous Breathing and Patient connected to face mask oxygen  Post-op Assessment: Report given to RN and Post -op Vital signs reviewed and stable  Post vital signs: Reviewed and stable  Last Vitals:  Vitals Value Taken Time  BP 122/74 01/29/21 1151  Temp    Pulse 96 01/29/21 1152  Resp 27 01/29/21 1153  SpO2 99 % 01/29/21 1152  Vitals shown include unvalidated device data.  Last Pain:  Vitals:   01/29/21 0720  TempSrc: Temporal  PainSc: Asleep         Complications: No complications documented.

## 2021-01-29 NOTE — Progress Notes (Signed)
Pt was not in room at shift change. Per RN Sibyl Parr he was taken to surgery

## 2021-01-29 NOTE — Progress Notes (Signed)
[facility]) Hospital Liaison note:  This patient is currently enrolled in [facility] outpatient-based Palliative Care. Will continue to follow for disposition.  Please call with any outpatient palliative questions or concerns.  Thank you, Lorelee Market, LPN [facility] Liaison [phone]

## 2021-01-29 NOTE — Plan of Care (Signed)

## 2021-01-29 NOTE — Op Note (Signed)
01/29/2021  12:27 PM  PATIENT:  [name]    PRE-OPERATIVE DIAGNOSIS:  Left intertrochanteric hip fracture; Left radius fracture just proximal to previous volar plate, skin tear over left thenar eminence  POST-OPERATIVE DIAGNOSIS:  Same  PROCEDURE:  INTRAMEDULLARY (IM) NAIL INTERTROCHANTRIC, OPEN REDUCTION INTERNAL FIXATION (ORIF) DISTAL RADIAL FRACTURE   SURGEON:  Thornton Park, MD  ANESTHESIA:   General  EBL:  100 cc total  IMPLANT:   ZIMMER BIOMET AFFIXUS NAIL 60mm x 452mm with a 137mm lag screw and distal interlocking screws 15mm  and 48 mm in length.  Zimmer Biomet DVR XL for radial fracture fixation  PREOPERATIVE INDICATIONS:  [name] is a  87 y.o. male with a diagnosis of Left hip fracture and left Wrist fracture above current plate status post fall.  Patient was on Eliquis upon admission.  Given the displacement of both fractures surgical fixation was recommended.  The risks, benefits and alternatives were discussed with the patient's son.  He understood the risks include but are not limited to infection, bleeding requiring blood transfusion, nerve or blood vessel injury, malunion, nonunion, hardware prominence, hardware failure, leg length discrepancy or change in lower extremity rotation and need for further surgery including hardware removal with conversion to a total hip arthroplasty. Medical risks include but are not limited to DVT and pulmonary embolism, myocardial infarction, stroke, pneumonia, respiratory failure and death. The patient's son understood these risks and wished to proceed with surgery.  OPERATIVE PROCEDURE:  The patient was brought to the operating room and placed in the supine position on the fracture table. The patient received general anesthesia.  A closed reduction was performed under C-arm guidance.  The fracture reduction was confirmed on both AP and lateral views. After adequate reduction was achieved, a time out was performed to  verify the patient's name, date of birth, medical record number, correct site of surgery correct procedure to be performed. The timeout was also used to verify the patient received antibiotics and all appropriate instruments, implants and radiographic studies were available in the room. Once all in attendance were in agreement, the case began. The patient was prepped and draped in a sterile fashion.  The patient received preoperative antibiotics with 2 g of Ancef IV.  An incision was made proximal to the greater trochanter in line with the femur. A guidewire was placed over the tip of the greater trochanter and advanced by drill into the proximal femur to the level of the lesser trochanter.  Confirmation of the drill pin position was made on AP and lateral C-arm images.  The threaded guidepin was then overdrilled with the proximal femoral entry reamer.  A ball-tipped guidewire was then advanced down the intramedullary canal, across the fracture, and down the femoral shaft to the knee.  The ball tip guidewire's position was confirmed at both the knee and hip via C-arm imaging. A depth gauge was used to measure the length of the long nail to be used. It was measured to be 420 mm.  Sequential reamers up to 13 mm were used to prepare the medullary canal.  The actual nail was then inserted into the proximal femur, across the fracture site and down the femoral shaft. Its position was confirmed on AP and lateral C-arm images.  The ball tip guidewire was removed.  Once the nail was completely seated, the drill guide for the lag screw was placed through the guide arm for the Affixus nail. A guidepin was then placed through this  drill guide and advanced through the lateral cortex of the femur, across the fracture site and into the femoral head achieving a tip apex distance of less than 25 mm. The length of the drill pin was measured to be 105 mm, and then the drill for the lag screw was advanced through the lateral  cortex, across the fracture site and up into the femoral head to the depth of the lag screw..  The lag screw was then advanced by hand into position across the fracture site into the femoral head. Its final position was confirmed on AP and lateral C-arm images. Compression was applied as traction was carefully released. The set screw in the top of the intramedullary rod was tightened by hand using a screwdriver. It was backed off a quarter turn to allow for compression at the fracture site.  The attention was then turned to placement of the distal interlocking screws. A perfect circle technique was used. 2 small stab incisions were made over the distal interlocking screw holes.  A free hand technique was used to drill both distal interlocking screws. The depth of the screw holes was measured with a depth gauge. The 59mm and 36mm screws were then advanced into position and tightened by hand. Final C-arm images of the entire intramedullary construct were taken in both the AP and lateral planes.   The wounds were irrigated copiously and closed with 0 Vicryl for closure of the deep fascia and 2-0 Vicryl for subcutaneous closure. The skin was approximated with staples. A dry sterile dressing was applied. I was scrubbed and present the entire case and all sharp, sponge and instrument counts were correct at the conclusion of the case.    Patient was then transferred from the fracture table to a regular OR table.  He was positioned supine with his left arm on a hand table.  Patient's splint was removed.  A Tourniquet was applied to the left upper arm.  The tourniquet was set for 250 mmHg and inflated for 106 minutes total.  He was prepped and draped in a sterile fashion.  A timeout was again performed to verify the patient's name, date of birth, medical record number, correct site of surgery and correct procedure to be performed.  Once all in attendance were in agreement the case began.  Patient's previous volar  radial incision was reopened and extended.  Care was taken to avoid injury to the radial artery and superficial radial nerve.  Soft tissues were bluntly dissected down to the level of the plate on the volar side of the radius.  A  self-retaining retractor was used to protect the radial neurovascular structures and median nerve.  The Bovie was used to expose the DVR plate.  Periosteal elevator was used to remove overlying bone from the edges of the plate.  All screws were removed from the plate and the plate was removed from the wrist.  The fracture was found to have occurred through the proximalmost cortical hole.  The fracture was then reduced with fracture clamps and provisionally held in position with two 0.062 K wires.  A Zimmer Biomet DVR XL plate was then placed along the volar aspect of the radius.  The plate was positioned such that the previous cortical holes could be reused.  They were redrilled and 2 bicortical screws were placed distal to the fracture.  Four bicortical screws were then placed proximal to the fracture.  A combination of threaded screws and smooth pegs were drilled and placed  just proximal to the articular surface of the distal radius.  Final FluoroScan images were taken in both AP and lateral planes.  The incision was then copiously irrigated.  The deep tissues were approximated with 0 Vicryl, subcutaneous tissues closed with 2-0 Vicryl and the skin approximated with staples.  Laceration over the left thenar eminence was closed with interrupted 4-0 nylon sutures.  Steri-Strips were applied over the laceration.  Xeroform was placed over both the laceration and surgical incision.  A dry sterile dressing was applied.  Two 3 x 15 splints were used in an AP fashion to reinforce the left distal radius.  A sling was applied to the left arm.  Patient was awoken and brought to the PACU in stable condition.  I was scrubbed and present for the entire case.  All sharp, sponge and instrument counts  were correct at conclusion the case.  I spoke with the patient's son and the postoperative consultation room to let him know the fractures were successfully fixed and his father was stable in the recovery room.  I answered all his questions.   Timoteo Gaul, MD

## 2021-01-29 NOTE — Progress Notes (Addendum)
Progress Note    [name]  TKZ:[id number] DOB: November 22, 1933  DOA: 01/24/2021 PCP: [name], MD      Brief Narrative:    Medical records reviewed and are as summarized below:  [name] is a 87 y.o. male with medical history of paroxysmal atrial fibrillation on Eliquis, COPD, hypertension, AAA, colon cancer, prostate cancer, vitamin B12 deficiency anemia, CAD with history of CABG,PVD, bilateral carotid artery stenosis, OSA on CPAP, dementia, who was brought from the memory care unit because of accidental fall.  Reportedly, he fell on his left side and he complained of pain in the left wrist and left hip.  Work-up revealed left displaced fracture of the distal radial diaphysis.  He was also found to have a left displaced intertrochanteric hip fracture,  nondisplaced fracture of right inferior pubic ramus and acute on subacute compression fracture and probable L2 vertebral body.[name]  He underwent intramedullary nail intertrochanteric left hip fracture and open reduction internal fixation of distal radius fracture.    Assessment/Plan:   Principal Problem:   Closed left hip fracture, initial encounter ([place]) Active Problems:   Hx of CABG   Essential hypertension   OSA on CPAP   PAF (paroxysmal atrial fibrillation) (HCC) CHA2DS2-VASc = 4. AC = Eliquis   COPD (chronic obstructive pulmonary disease) (HCC)   Accidental fall   Closed fracture of left distal radius   Dementia without behavioral disturbance (HCC)   Pubic ramus fracture, left, closed, initial encounter ([place])   Hip fracture (HCC)   Protein-calorie malnutrition, severe   Nutrition Problem: Severe Malnutrition (in the context of chrinic illness) Etiology: poor appetite  Signs/Symptoms: percent weight loss,severe muscle depletion,severe fat depletion (15.7% x 3 months) Percent weight loss: 15.7 %   Body mass index is 19.79 kg/m.    Left intertrochanteric hip fracture, left radius fracture,  fracture of right inferior pubic ramus, acute on subacute L2 compression fracture: S/p  intramedullary nail intertrochanteric left hip fracture and open reduction internal fixation of distal radius fracture.  Analgesics as needed for pain.  Follow-up with orthopedic surgeon.  PT and OT.  Paroxysmal atrial fibrillation: Eliquis on hold.  Continue metoprolol.  Anemia of chronic disease: Monitor H&H postsurgery.  COPD, OSA: Continue bronchodilators.  Continue CPAP at night.  Lewy body dementia: Donepezil and supportive care.  Other comorbidities include hypertension, CAD, PVD         Diet Order            Diet regular Room service appropriate? Yes; Fluid consistency: Thin  Diet effective now                    Consultants:  Orthopedic surgeon  Procedures:  S/p intramedullary nail intertrochanteric left hip fracture and open reduction internal fixation of distal radius fracture    Medications:   . acetaminophen  500 mg Oral Q6H  . acetaminophen  650 mg Oral Once  . [START ON 01/30/2021] apixaban  5 mg Oral BID  . bacitracin  1 application Topical QHS  . cholecalciferol  2,000 Units Oral Daily  . docusate sodium  100 mg Oral BID  . donepezil  10 mg Oral QHS  . ezetimibe  10 mg Oral Daily  . feeding supplement  237 mL Oral BID BM  . finasteride  5 mg Oral Daily  . fluticasone  2 spray Each Nare Daily  . lisinopril  2.5 mg Oral BID  . magnesium oxide  400 mg Oral  QPM  . metoprolol tartrate  25 mg Oral BID  . mirabegron ER  50 mg Oral Daily  . mirtazapine  7.5 mg Oral QHS  . multivitamin with minerals  1 tablet Oral Daily  . polyvinyl alcohol  1 drop Both Eyes QHS  . rosuvastatin  20 mg Oral Daily  . senna  1 tablet Oral BID  . traZODone  50 mg Oral QHS   Continuous Infusions: .  ceFAZolin (ANCEF) IV 2 g (01/29/21 1428)  . methocarbamol (ROBAXIN) IV       Anti-infectives (From admission, onward)   Start     Dose/Rate Route Frequency Ordered Stop    01/29/21 1400  ceFAZolin (ANCEF) IVPB 2g/100 mL premix        2 g 200 mL/hr over 30 Minutes Intravenous Every 6 hours 01/29/21 1246 01/30/21 0159   01/29/21 0840  gentamicin 80 mg in 0.9% sodium chloride 250 mL irrigation  Status:  Discontinued          As needed 01/29/21 0853 01/29/21 1146   01/29/21 0750  ceFAZolin (ANCEF) 2-4 GM/100ML-% IVPB       Note to Pharmacy: Sylvester Harder   : cabinet override      01/29/21 0750 01/29/21 0832   01/29/21 0000  ceFAZolin (ANCEF) IVPB 2g/100 mL premix        2 g 200 mL/hr over 30 Minutes Intravenous 30 min pre-op 01/25/21 1546 01/29/21 2330             Family Communication/Anticipated D/C date and plan/Code Status   DVT prophylaxis: SCDs Start: 01/29/21 1247 Place TED hose Start: 01/29/21 1247 apixaban (ELIQUIS) tablet 5 mg     Code Status: Full Code  Family Communication: [name], son, at the bedside Disposition Plan:    Status is: Inpatient  Remains inpatient appropriate because:Inpatient level of care appropriate due to severity of illness   Dispo: The patient is from: Memory care unit              Anticipated d/c is to: SNF              Patient currently is not medically stable to d/c.   Difficult to place patient No           Subjective:   Interval events noted.  He is unable to provide any history.  His son, [name], was at the bedside.  Objective:    Vitals:   01/29/21 1245 01/29/21 1300 01/29/21 1315 01/29/21 1448  BP: (!) 105/58 (!) 98/58 100/71 115/67  Pulse: 87 88 96 92  Resp: 14 14 14 16   Temp: (!) 97 F (36.1 C)  (!) 97 F (36.1 C)   TempSrc:      SpO2: 99% 99% 100% 100%  Weight:      Height:       No data found.   Intake/Output Summary (Last 24 hours) at 01/29/2021 1623 Last data filed at 01/29/2021 1500 Gross per 24 hour  Intake 1053.13 ml  Output 200 ml  Net 853.13 ml   Filed Weights   01/24/21 2028 01/25/21 1429 01/29/21 0720  Weight: 68.9 kg 66.2 kg 66.2 kg    Exam:  GEN:  NAD SKIN: Warm and dry EYES: No pallor or icterus ENT: MMM CV: RRR PULM: CTA B ABD: soft, ND, NT, +BS CNS: Drowsy from recent anesthesia.  Limited exam. EXT: Left upper extremity immobilized.  Dressing on left upper extremity is dry and intact.  Dressing on the left hip  surgical wound is dry and intact.        Data Reviewed:   I have personally reviewed following labs and imaging studies:  Labs: Labs show the following:   Basic Metabolic Panel: Recent Labs  Lab 01/24/21 2041  NA 139  K 4.1  CL 104  CO2 26  GLUCOSE 107*  BUN 16  CREATININE 0.87  CALCIUM 9.0   GFR Estimated Creatinine Clearance: 58.1 mL/min (by C-G formula based on SCr of 0.87 mg/dL). Liver Function Tests: No results for input(s): AST, ALT, ALKPHOS, BILITOT, PROT, ALBUMIN in the last 168 hours. No results for input(s): LIPASE, AMYLASE in the last 168 hours. No results for input(s): AMMONIA in the last 168 hours. Coagulation profile Recent Labs  Lab 01/25/21 1638  INR 1.6*    CBC: Recent Labs  Lab 01/24/21 2041 01/26/21 0418 01/27/21 0432 01/28/21 0502 01/29/21 0524  WBC 10.2 9.5 11.0* 9.9 10.4  NEUTROABS 7.1  --   --   --   --   HGB 11.3* 9.3* 8.8* 8.5* 8.4*  HCT 33.9* 27.8* 27.1* 25.5* 24.9*  MCV 95.0 93.3 94.4 93.4 94.7  PLT 158 121* 124* 128* 150   Cardiac Enzymes: No results for input(s): CKTOTAL, CKMB, CKMBINDEX, TROPONINI in the last 168 hours. BNP (last 3 results) No results for input(s): PROBNP in the last 8760 hours. CBG: No results for input(s): GLUCAP in the last 168 hours. D-Dimer: No results for input(s): DDIMER in the last 72 hours. Hgb A1c: No results for input(s): HGBA1C in the last 72 hours. Lipid Profile: No results for input(s): CHOL, HDL, LDLCALC, TRIG, CHOLHDL, LDLDIRECT in the last 72 hours. Thyroid function studies: No results for input(s): TSH, T4TOTAL, T3FREE, THYROIDAB in the last 72 hours.  Invalid input(s): FREET3 Anemia work up: No results for  input(s): VITAMINB12, FOLATE, FERRITIN, TIBC, IRON, RETICCTPCT in the last 72 hours. Sepsis Labs: Recent Labs  Lab 01/26/21 0418 01/27/21 0432 01/28/21 0502 01/29/21 0524  WBC 9.5 11.0* 9.9 10.4    Microbiology Recent Results (from the past 240 hour(s))  Resp Panel by RT-PCR (Flu A&B, Covid) Nasopharyngeal Swab     Status: None   Collection Time: 01/24/21  1:43 AM   Specimen: Nasopharyngeal Swab; Nasopharyngeal(NP) swabs in vial transport medium  Result Value Ref Range Status   SARS Coronavirus 2 by RT PCR NEGATIVE NEGATIVE Final    Comment: (NOTE) SARS-CoV-2 target nucleic acids are NOT DETECTED.  The SARS-CoV-2 RNA is generally detectable in upper respiratory specimens during the acute phase of infection. The lowest concentration of SARS-CoV-2 viral copies this assay can detect is 138 copies/mL. A negative result does not preclude SARS-Cov-2 infection and should not be used as the sole basis for treatment or other patient management decisions. A negative result may occur with  improper specimen collection/handling, submission of specimen other than nasopharyngeal swab, presence of viral mutation(s) within the areas targeted by this assay, and inadequate number of viral copies(<138 copies/mL). A negative result must be combined with clinical observations, patient history, and epidemiological information. The expected result is Negative.  Fact Sheet for Patients:  EntrepreneurPulse.com.au  Fact Sheet for Healthcare Providers:  IncredibleEmployment.be  This test is no t yet approved or cleared by the [place] FDA and  has been authorized for detection and/or diagnosis of SARS-CoV-2 by FDA under an Emergency Use Authorization (EUA). This EUA will remain  in effect (meaning this test can be used) for the duration of the COVID-19 declaration under Section 564(b)(1) of  the Act, 21 U.S.C.section 360bbb-3(b)(1), unless the authorization is  terminated  or revoked sooner.       Influenza A by PCR NEGATIVE NEGATIVE Final   Influenza B by PCR NEGATIVE NEGATIVE Final    Comment: (NOTE) The Xpert Xpress SARS-CoV-2/FLU/RSV plus assay is intended as an aid in the diagnosis of influenza from Nasopharyngeal swab specimens and should not be used as a sole basis for treatment. Nasal washings and aspirates are unacceptable for Xpert Xpress SARS-CoV-2/FLU/RSV testing.  Fact Sheet for Patients: EntrepreneurPulse.com.au  Fact Sheet for Healthcare Providers: IncredibleEmployment.be  This test is not yet approved or cleared by the [place] FDA and has been authorized for detection and/or diagnosis of SARS-CoV-2 by FDA under an Emergency Use Authorization (EUA). This EUA will remain in effect (meaning this test can be used) for the duration of the COVID-19 declaration under Section 564(b)(1) of the Act, 21 U.S.C. section 360bbb-3(b)(1), unless the authorization is terminated or revoked.  Performed at [facility], [address] [place], [place] [address]   MRSA PCR Screening     Status: None   Collection Time: 01/28/21 10:51 AM   Specimen: Nasopharyngeal  Result Value Ref Range Status   MRSA by PCR NEGATIVE NEGATIVE Final    Comment:        The GeneXpert MRSA Assay (FDA approved for NASAL specimens only), is one component of a comprehensive MRSA colonization surveillance program. It is not intended to diagnose MRSA infection nor to guide or monitor treatment for MRSA infections. Performed at [facility], [address] [place], [place] [address]     Procedures and diagnostic studies:  DG Wrist Complete Left  Result Date: 01/29/2021 CLINICAL DATA:  Status post left wrist ORIF EXAM: LEFT WRIST - COMPLETE 3+ VIEW COMPARISON:  01/24/2021 left wrist radiographs FINDINGS: Status post transfixation of distal metadiaphysis left radius fracture in near-anatomic  alignment by a volar surgical plate with multiple interlocking screws with no hardware fracture. Volar skin staples. Generalized soft tissue swelling and expected scattered soft tissue gas in the volar left wrist soft tissues. No dislocation at the left wrist. Minimally distally displaced ulnar styloid fracture. Moderate first carpometacarpal joint osteoarthritis. No suspicious focal osseous lesions. Healed distal left ulna shaft deformity. IMPRESSION: 1. Near-anatomic alignment of distal left radius fracture status post ORIF. 2. Minimally displaced ulnar styloid fracture. Electronically Signed   By: Ilona Sorrel M.D.   On: 01/29/2021 13:58   DG MINI C-ARM IMAGE ONLY  Result Date: 01/29/2021 There is no interpretation for this exam.  This order is for images obtained during a surgical procedure.  Please See "Surgeries" Tab for more information regarding the procedure.   DG HIP OPERATIVE UNILAT W OR W/O PELVIS LEFT  Result Date: 01/29/2021 CLINICAL DATA:  Intramedullary nail fixation for fracture EXAM: OPERATIVE LEFT HIP  2 VIEWS TECHNIQUE: Fluoroscopic spot image(s) were submitted for interpretation post-operatively. FLUOROSCOPY TIME:  1 minutes 30 seconds; 4 acquired images COMPARISON:  Preoperative study Jan 24, 2021 FINDINGS: Frontal and lateral views show nail fixation through an intertrochanteric femur fracture on the left with alignment essentially anatomic at the fracture site. Tip of screw in proximal left femoral head. No other fracture. No dislocation. Mild narrowing left hip joint. IMPRESSION: Screw and nail fixation through intertrochanteric femur fracture with alignment fracture site essentially anatomic. Screw tip in proximal femoral head. No new fracture. No dislocation. Mild narrowing left hip joint. Electronically Signed   By: Lowella Grip III M.D.  On: 01/29/2021 09:51   DG FEMUR PORT MIN 2 VIEWS LEFT  Result Date: 01/29/2021 CLINICAL DATA:  Status post left intramedullary nail  EXAM: LEFT FEMUR PORTABLE 2 VIEWS COMPARISON:  01/29/2021 left hip radiographs FINDINGS: Status post transfixation of intertrochanteric proximal left femur fracture in near-anatomic alignment by intramedullary rod with interlocking left femoral neck pin and 2 distal interlocking screws with no hardware fracture. Skin staples lateral to the distal left femur. No suspicious osseous lesions. Vascular calcifications in the posterior soft tissues. No dislocation at the left knee or left hip on these views. Partially visualized vascular stent in the left pelvis. IMPRESSION: Near-anatomic alignment of intertrochanteric proximal left femur fracture status post ORIF. Electronically Signed   By: Ilona Sorrel M.D.   On: 01/29/2021 13:55               LOS: 5 days   Latasha Puskas  [facility]   Pager on [email or web address]. If 7PM-7AM, please contact night-coverage at [email or web address]     01/29/2021, 4:23 PM

## 2021-01-30 ENCOUNTER — Encounter: Payer: Self-pay | Admitting: Orthopedic Surgery

## 2021-01-30 DIAGNOSIS — I480 Paroxysmal atrial fibrillation: Secondary | ICD-10-CM | POA: Diagnosis not present

## 2021-01-30 DIAGNOSIS — F0280 Dementia in other diseases classified elsewhere without behavioral disturbance: Secondary | ICD-10-CM | POA: Diagnosis not present

## 2021-01-30 DIAGNOSIS — G3183 Dementia with Lewy bodies: Secondary | ICD-10-CM

## 2021-01-30 DIAGNOSIS — S72002A Fracture of unspecified part of neck of left femur, initial encounter for closed fracture: Secondary | ICD-10-CM | POA: Diagnosis not present

## 2021-01-30 LAB — BASIC METABOLIC PANEL
Anion gap: 9 (ref 5–15)
BUN: 26 mg/dL — ABNORMAL HIGH (ref 8–23)
CO2: 21 mmol/L — ABNORMAL LOW (ref 22–32)
Calcium: 8.5 mg/dL — ABNORMAL LOW (ref 8.9–10.3)
Chloride: 109 mmol/L (ref 98–111)
Creatinine, Ser: 0.67 mg/dL (ref 0.61–1.24)
GFR, Estimated: >60 mL/min (ref >60)
Glucose, Bld: 120 mg/dL — ABNORMAL HIGH (ref 70–99)
Potassium: 4.3 mmol/L (ref 3.5–5.1)
Sodium: 139 mmol/L (ref 135–145)

## 2021-01-30 LAB — CBC
HCT: 24 % — ABNORMAL LOW (ref 39.0–52.0)
Hemoglobin: 8 g/dL — ABNORMAL LOW (ref 13.0–17.0)
MCH: 31.4 pg (ref 26.0–34.0)
MCHC: 33.3 g/dL (ref 30.0–36.0)
MCV: 94.1 fL (ref 80.0–100.0)
Platelets: 152 10*3/uL (ref 150–400)
RBC: 2.55 MIL/uL — ABNORMAL LOW (ref 4.22–5.81)
RDW: 16.6 % — ABNORMAL HIGH (ref 11.5–15.5)
WBC: 9.7 10*3/uL (ref 4.0–10.5)
nRBC: 0 % (ref 0.0–0.2)

## 2021-01-30 NOTE — Evaluation (Signed)
Physical Therapy Evaluation Patient Details Name: [name] MRN: [id number] DOB: 04-26-1934 Today's Date: 01/30/2021   History of Present Illness  [name] is an 81yoM who comes to [facility] on 5/26 after a fall and subsequent hip pain. Imaging revealing fractures of Left wrist and left hip, as well as Rt inferior pubic ramus. Wrist fracute is directly adjacent to prior ORIF volar plating. Lumbar films show acute on subacute L2 compression fracture with 8mm bony retropulsion with "moderate spinal stenosis now seen at this level". PMH: compression fracture and vertebroplasty at T12, L3, L4, L5, PAF on eliquis, COPD, HTN, CAD s/p CABG, OSA on CPAP, Parkinsonism, lewy body dementia, AAA s/p endoprosthesis. After eliquis washout, pt went to OR on 5/31 for Left hip ORIF IM nailing, Left distal radius ORIF. Pt is WBAT at LLE and NWB LUE. Presurgical Hb: 11.3 down to 8.0 on POD1.  Clinical Impression  Pt admitted with above diagnosis. Pt currently with functional limitations due to the deficits listed below (see "PT Problem List"). Upon entry, pt in bed, awake and agreeable to participate- son at bedside provides updated PLOF; pt familiar to author from services in [place]. The pt is alert, pleasant, interactive, and able to provide follow simple commands. Pt agreeable to attempt bed mobility despite pain, requires modA to EOB, then maxA to return to supine. Pt is very limiting at EOB. Pt tolerates sitting at EOB for ~ 8-10 minutes prior to significant advancement of fatigue. Patient's performance this date reveals decreased ability, independence, and tolerance in performing all basic mobility required for performance of activities of daily living. Pt requires additional DME, close physical assistance, and cues for safe participate in mobility. Pt will benefit from skilled PT intervention to increase independence and safety with basic mobility in preparation for discharge to the venue listed below.        Follow Up Recommendations SNF;Supervision for mobility/OOB    Equipment Recommendations  None recommended by PT (May need a platform RW)    Recommendations for Other Services       Precautions / Restrictions Precautions Precautions: Fall Restrictions Weight Bearing Restrictions: Yes LUE Weight Bearing: Non weight bearing RLE Weight Bearing: Weight bearing as tolerated (per verbal order Dr. Raliegh Ip (pubis fracture)) LLE Weight Bearing: Weight bearing as tolerated      Mobility  Bed Mobility Overal bed mobility: Needs Assistance Bed Mobility: Supine to Sit;Sit to Supine     Supine to sit: Mod assist Sit to supine: Max assist   General bed mobility comments: pain comes from nothing to 8/10 (will attempt premedication prior)    Transfers Overall transfer level:  (pt deferred due to weakness, pain)                  Ambulation/Gait                Stairs            Wheelchair Mobility    Modified Rankin (Stroke Patients Only)       Balance Overall balance assessment: Mild deficits observed, not formally tested;History of Falls;Needs assistance   Sitting balance-Leahy Scale: Fair                                       Pertinent Vitals/Pain Pain Assessment: 0-10 Pain Score: 8  Pain Location: Left hip operative site with bed mobility and sitting EOB; denies low back pain,  denies RLE pain Pain Intervention(s): Limited activity within patient's tolerance;Monitored during session;Patient requesting pain meds-RN notified    Home Living Family/patient expects to be discharged to:: Assisted living               Home Equipment: Walker - 4 wheels Additional Comments: transitioned to memory care ~ 3 months prior (mid March)    Prior Function Level of Independence: Needs assistance   Gait / Transfers Assistance Needed: household distance AMB at baseline, 4WW or RW; frequent falls history  ADL's / Homemaking Assistance Needed:  assistance required for quality performance        Hand Dominance        Extremity/Trunk Assessment                Communication   Communication: HOH  Cognition Arousal/Alertness: Awake/alert Behavior During Therapy: WFL for tasks assessed/performed Overall Cognitive Status: Within Functional Limits for tasks assessed                                 General Comments: drowsy, somnolent when not engaged      General Comments      Exercises General Exercises - Lower Extremity Heel Slides: AAROM;Both;10 reps;Supine Hip ABduction/ADduction: AAROM;Both;10 reps;Supine   Assessment/Plan    PT Assessment Patient needs continued PT services  PT Problem List Decreased strength;Decreased range of motion;Decreased activity tolerance;Decreased balance;Decreased mobility;Decreased knowledge of use of DME;Decreased safety awareness;Decreased cognition;Decreased knowledge of precautions;Pain       PT Treatment Interventions DME instruction;Gait training;Stair training;Functional mobility training;Therapeutic activities;Therapeutic exercise;Balance training;Neuromuscular re-education;Patient/family education    PT Goals (Current goals can be found in the Care Plan section)  Acute Rehab PT Goals Patient Stated Goal: return to baseline mobility PT Goal Formulation: With patient/family Time For Goal Achievement: 02/13/21 Potential to Achieve Goals: Fair    Frequency 7X/week   Barriers to discharge Decreased caregiver support current memory care cannot meet patient's needs.    Co-evaluation               AM-PAC PT "6 Clicks" Mobility  Outcome Measure Help needed turning from your back to your side while in a flat bed without using bedrails?: Total Help needed moving from lying on your back to sitting on the side of a flat bed without using bedrails?: Total Help needed moving to and from a bed to a chair (including a wheelchair)?: Total Help needed  standing up from a chair using your arms (e.g., wheelchair or bedside chair)?: Total Help needed to walk in hospital room?: Total Help needed climbing 3-5 steps with a railing? : Total 6 Click Score: 6    End of Session   Activity Tolerance: Patient tolerated treatment well;Patient limited by pain;Patient limited by lethargy Patient left: in bed;with family/visitor present;with bed alarm set Nurse Communication: Mobility status PT Visit Diagnosis: Unsteadiness on feet (R26.81);Difficulty in walking, not elsewhere classified (R26.2);Muscle weakness (generalized) (M62.81);Other abnormalities of gait and mobility (R26.89)    Time: 8889-1694 PT Time Calculation (min) (ACUTE ONLY): 35 min   Charges:   PT Evaluation $PT Eval High Complexity: 1 High PT Treatments $Therapeutic Exercise: 8-22 mins        2:20 PM, 01/30/21 Etta Grandchild, PT, DPT Physical Therapist - [facility]  [phone] ([place])    [place] 01/30/2021, 2:17 PM

## 2021-01-30 NOTE — Anesthesia Postprocedure Evaluation (Signed)
Anesthesia Post Note  Patient: [name]  Procedure(s) Performed: INTRAMEDULLARY (IM) NAIL INTERTROCHANTRIC (Left ) OPEN REDUCTION INTERNAL FIXATION (ORIF) DISTAL RADIAL FRACTURE (Left ) IRRIGATION AND DEBRIDEMENT Left Wrist and Removal of Plate (Left )  Patient location during evaluation: PACU Anesthesia Type: General Level of consciousness: awake and alert Pain management: pain level controlled Vital Signs Assessment: post-procedure vital signs reviewed and stable Respiratory status: spontaneous breathing and respiratory function stable Cardiovascular status: stable Anesthetic complications: no   No complications documented.   Last Vitals:  Vitals:   01/30/21 0409 01/30/21 0742  BP: 124/72 115/73  Pulse: 95 96  Resp: 16 19  Temp: 36.6 C 36.6 C  SpO2: 95% 100%    Last Pain:  Vitals:   01/30/21 0900  TempSrc:   PainSc: 0-No pain                 Livian Vanderbeck K

## 2021-01-30 NOTE — Evaluation (Signed)
Occupational Therapy Evaluation Patient Details Name: [name] MRN: [id number] DOB: 01-13-1934 Today's Date: 01/30/2021    History of Present Illness [name] is an 24yoM who comes to [facility] on 5/26 after a fall and subsequent hip pain. Imaging revealing fractures of Left wrist and left hip, as well as Rt inferior pubic ramus. Wrist fracute is directly adjacent to prior ORIF volar plating. Lumbar films show acute on subacute L2 compression fracture with 53m bony retropulsion with "moderate spinal stenosis now seen at this level". PMH: compression fracture and vertebroplasty at T12, L3, L4, L5, PAF on eliquis, COPD, HTN, CAD s/p CABG, OSA on CPAP, Parkinsonism, lewy body dementia, AAA s/p endoprosthesis. After eliquis washout, pt went to OR on 5/31 for Left hip ORIF IM nailing, Left distal radius ORIF. Pt is WBAT at LLE and NWB LUE. Presurgical Hb: 11.3 down to 8.0 on POD1.   Clinical Impression   Pt seen for OT evaluation this date in setting of acute hospitalization d/t fall with several resultant injuries including L hip fx, now s/p IM nailing. Pt presents with confusion (h/o cognitive decline at baseline) and is a poor historian as he reports he was driving himself and lives alone ([facility] review [name] Pt presents this date with decreased strength, decreased safety awareness, general deconditioning, pain and decreased insight into deficits and precautions. ADL and mobilization assessment limited as pt c/o too much pain at this time (RN notified). Pt requires MOD A for UB ADLs, MAX/TOTAL A for LB ADLs, all bed level as pt declines to come to EOB sitting and fervently grimaces/guards when OT attempts to gently encourage mobilizing to EOB sitting. Pt engages in light bed level ADLs including self-drinking with SETUP bed level and MOD A to don clean gown to front side with little ability to adhere to L UE precautions. Pt left in bed with  all needs met and in reach. Will continue to follow acutely and anticipate pt will require STR in SNF setting to improve strength and reduce fall risk as it relates to performance of ADLs/ADL mobility.    Follow Up Recommendations  SNF;Supervision/Assistance - 24 hour    Equipment Recommendations  Other (comment) (defer to next level of care)    Recommendations for Other Services       Precautions / Restrictions Precautions Precautions: Fall Restrictions Weight Bearing Restrictions: Yes LUE Weight Bearing: Non weight bearing RLE Weight Bearing: Weight bearing as tolerated (per verbal order [name](pubis fracture)) LLE Weight Bearing: Weight bearing as tolerated      Mobility Bed Mobility Overal bed mobility: Needs Assistance Bed Mobility: Rolling Rolling: Max assist;Total assist         General bed mobility comments: MAX/TOTAL to attempt rolling, but ultimately unsuccessful d/t pt c/o too much pain, RN notified. In addition, attempted to engage pt into coming to sitting position on side of bed, but he does not tolerate L hip abduction to advance toward EOB beyond ~1-2 inches before stating it is too painful for him to tolerate and saying "I won't go any further"    Transfers                      Balance  ADL either performed or assessed with clinical judgement   ADL Overall ADL's : Needs assistance/impaired                                       General ADL Comments: MOD A for UB ADLs, MAX/TOTAL A for LB ADLs, all bed level, pt with too much pain to mobilize into sitting at this time, RN notified.     Vision Baseline Vision/History: Wears glasses Wears Glasses: At all times Patient Visual Report: No change from baseline       Perception     Praxis      Pertinent Vitals/Pain Pain Assessment: Faces Faces Pain Scale: Hurts whole lot Pain Location: L LE and R LE Pain Descriptors  / Indicators: Grimacing;Guarding Pain Intervention(s): Limited activity within patient's tolerance;Monitored during session (notified RN and asked her to assess if pt wants pain medication)     Hand Dominance     Extremity/Trunk Assessment Upper Extremity Assessment Upper Extremity Assessment: Generalized weakness;RUE deficits/detail;LUE deficits/detail RUE Deficits / Details: ROM WFL, MMT grossly 4-/5 LUE Deficits / Details: wrist/hand ROM limited d/t immobilization with wrap, shld immobilized wtih sling. Pt with limtied digit flexion/extension, could be 2/2 bulky dressing on hand. LUE: Unable to fully assess due to immobilization           Communication Communication Communication: HOH   Cognition Arousal/Alertness: Awake/alert Behavior During Therapy: WFL for tasks assessed/performed Overall Cognitive Status: History of cognitive impairments - at baseline                                 General Comments: somewhat fatigued/drowsy, but attends and is able to be engaged during OT session, does perseverate on pain with attempts to engage in mobilization. Follows ~80% simple one step commands, somewhat HOH, increaesd processing time and MIN/MOD tactile cues   General Comments       Exercises     Shoulder Instructions      Home Living Family/patient expects to be discharged to:: Assisted living                             Home Equipment: Walker - 4 wheels   Additional Comments: transitioned to memory care ~ 3 months prior (mid March)      Prior Functioning/Environment Level of Independence: Needs assistance  Gait / Transfers Assistance Needed: household distance AMB at baseline, 4WW or RW; frequent falls history ADL's / Homemaking Assistance Needed: assistance required for quality performance            OT Problem List: Decreased strength;Decreased range of motion;Decreased activity tolerance;Impaired balance (sitting and/or  standing);Decreased cognition;Decreased safety awareness;Decreased knowledge of use of DME or AE;Decreased knowledge of precautions;Pain      OT Treatment/Interventions: Self-care/ADL training;DME and/or AE instruction;Therapeutic activities;Balance training;Therapeutic exercise;Energy conservation;Patient/family education    OT Goals(Current goals can be found in the care plan section) Acute Rehab OT Goals Patient Stated Goal: return to baseline mobility OT Goal Formulation: With patient Time For Goal Achievement: 02/13/21 Potential to Achieve Goals: Fair ADL Goals Pt Will Transfer to Toilet: with min assist;stand pivot transfer;bedside commode Pt Will Perform Toileting - Clothing Manipulation and hygiene: with min assist;sit to/from stand Pt/caregiver will Perform Home Exercise Program: Increased strength;Both right and left upper extremity;With minimal assist Additional ADL  Goal #1: Pt will tolerate 2 seated ADLs/theract versus one standing ADL/theract with MOD A to sustain stand to improve fxl activity tolerance.  OT Frequency: Min 1X/week   Barriers to D/C:            Co-evaluation              AM-PAC OT "6 Clicks" Daily Activity     Outcome Measure Help from another person eating meals?: A Little Help from another person taking care of personal grooming?: A Lot Help from another person toileting, which includes using toliet, bedpan, or urinal?: A Lot Help from another person bathing (including washing, rinsing, drying)?: A Lot Help from another person to put on and taking off regular upper body clothing?: A Lot Help from another person to put on and taking off regular lower body clothing?: Total 6 Click Score: 12   End of Session Nurse Communication: Other (comment) (pain)  Activity Tolerance: Patient limited by pain Patient left: in bed;with call bell/phone within reach;with bed alarm set  OT Visit Diagnosis: Unsteadiness on feet (R26.81);Muscle weakness  (generalized) (M62.81);History of falling (Z91.81);Repeated falls (R29.6);Pain Pain - Right/Left: Left Pain - part of body: Arm;Hip                Time: 2334-3568 OT Time Calculation (min): 25 min Charges:  OT General Charges $OT Visit: 1 Visit OT Evaluation $OT Eval Moderate Complexity: 1 Mod OT Treatments $Self Care/Home Management : 8-22 mins  Gerrianne Scale, MS, OTR/L ascom [phone] 01/30/21, 6:20 PM

## 2021-01-30 NOTE — Progress Notes (Signed)
Subjective:  Patient was seen earlier today at approximately 1:30 PM.  His son was in the room.  The patient is POD #1 s/p intramedullary fixation for left intertrochanteric hip fracture and revision ORIF of a left radius fracture just proximal to a previous volar plate.   Patient reports left wrist and hip pain as mild.  Patient was resting in bed comfortably.  Objective:   VITALS:   Vitals:   01/30/21 0915 01/30/21 0925 01/30/21 1218 01/30/21 1552  BP:   108/65 109/72  Pulse:   86 81  Resp:   16 19  Temp:   97.7 F (36.5 C) 98.3 F (36.8 C)  TempSrc:   Axillary Axillary  SpO2: (!) 85% 91% 98% 99%  Weight:      Height:        PHYSICAL EXAM: Left lower extremity: Neurovascular intact Sensation intact distally Intact pulses distally Dorsiflexion/Plantar flexion intact Left hip honeycomb dressings: scant drainage No cellulitis present Compartment soft  Left forearm: Splint and dressing are clean dry and intact. Patient has mild generalized swelling in the left digits. His fingers are well-perfused.  He can flex and extend his fingers.  LABS  Results for orders placed or performed during the hospital encounter of 01/24/21 (from the past 24 hour(s))  CBC     Status: Abnormal   Collection Time: 01/30/21  4:23 AM  Result Value Ref Range   WBC 9.7 4.0 - 10.5 K/uL   RBC 2.55 (L) 4.22 - 5.81 MIL/uL   Hemoglobin 8.0 (L) 13.0 - 17.0 g/dL   HCT 24.0 (L) 39.0 - 52.0 %   MCV 94.1 80.0 - 100.0 fL   MCH 31.4 26.0 - 34.0 pg   MCHC 33.3 30.0 - 36.0 g/dL   RDW 16.6 (H) 11.5 - 15.5 %   Platelets 152 150 - 400 K/uL   nRBC 0.0 0.0 - 0.2 %  Basic metabolic panel     Status: Abnormal   Collection Time: 01/30/21  4:23 AM  Result Value Ref Range   Sodium 139 135 - 145 mmol/L   Potassium 4.3 3.5 - 5.1 mmol/L   Chloride 109 98 - 111 mmol/L   CO2 21 (L) 22 - 32 mmol/L   Glucose, Bld 120 (H) 70 - 99 mg/dL   BUN 26 (H) 8 - 23 mg/dL   Creatinine, Ser 0.67 0.61 - 1.24 mg/dL   Calcium  8.5 (L) 8.9 - 10.3 mg/dL   GFR, Estimated >60 >60 mL/min   Anion gap 9 5 - 15   *Note: Due to a large number of results and/or encounters for the requested time period, some results have not been displayed. A complete set of results can be found in Results Review.    DG Wrist Complete Left  Result Date: 01/29/2021 CLINICAL DATA:  Status post left wrist ORIF EXAM: LEFT WRIST - COMPLETE 3+ VIEW COMPARISON:  01/24/2021 left wrist radiographs FINDINGS: Status post transfixation of distal metadiaphysis left radius fracture in near-anatomic alignment by a volar surgical plate with multiple interlocking screws with no hardware fracture. Volar skin staples. Generalized soft tissue swelling and expected scattered soft tissue gas in the volar left wrist soft tissues. No dislocation at the left wrist. Minimally distally displaced ulnar styloid fracture. Moderate first carpometacarpal joint osteoarthritis. No suspicious focal osseous lesions. Healed distal left ulna shaft deformity. IMPRESSION: 1. Near-anatomic alignment of distal left radius fracture status post ORIF. 2. Minimally displaced ulnar styloid fracture. Electronically Signed   By: Ilona Sorrel  M.D.   On: 01/29/2021 13:58   DG MINI C-ARM IMAGE ONLY  Result Date: 01/29/2021 There is no interpretation for this exam.  This order is for images obtained during a surgical procedure.  Please See "Surgeries" Tab for more information regarding the procedure.   DG HIP OPERATIVE UNILAT W OR W/O PELVIS LEFT  Result Date: 01/29/2021 CLINICAL DATA:  Intramedullary nail fixation for fracture EXAM: OPERATIVE LEFT HIP  2 VIEWS TECHNIQUE: Fluoroscopic spot image(s) were submitted for interpretation post-operatively. FLUOROSCOPY TIME:  1 minutes 30 seconds; 4 acquired images COMPARISON:  Preoperative study Jan 24, 2021 FINDINGS: Frontal and lateral views show nail fixation through an intertrochanteric femur fracture on the left with alignment essentially anatomic at  the fracture site. Tip of screw in proximal left femoral head. No other fracture. No dislocation. Mild narrowing left hip joint. IMPRESSION: Screw and nail fixation through intertrochanteric femur fracture with alignment fracture site essentially anatomic. Screw tip in proximal femoral head. No new fracture. No dislocation. Mild narrowing left hip joint. Electronically Signed   By: Lowella Grip III M.D.   On: 01/29/2021 09:51   DG FEMUR PORT MIN 2 VIEWS LEFT  Result Date: 01/29/2021 CLINICAL DATA:  Status post left intramedullary nail EXAM: LEFT FEMUR PORTABLE 2 VIEWS COMPARISON:  01/29/2021 left hip radiographs FINDINGS: Status post transfixation of intertrochanteric proximal left femur fracture in near-anatomic alignment by intramedullary rod with interlocking left femoral neck pin and 2 distal interlocking screws with no hardware fracture. Skin staples lateral to the distal left femur. No suspicious osseous lesions. Vascular calcifications in the posterior soft tissues. No dislocation at the left knee or left hip on these views. Partially visualized vascular stent in the left pelvis. IMPRESSION: Near-anatomic alignment of intertrochanteric proximal left femur fracture status post ORIF. Electronically Signed   By: Ilona Sorrel M.D.   On: 01/29/2021 13:55    Assessment/Plan: 1 Day Post-Op   Principal Problem:   Closed left hip fracture, initial encounter ([place]) Active Problems:   Hx of CABG   Essential hypertension   OSA on CPAP   PAF (paroxysmal atrial fibrillation) (HCC) CHA2DS2-VASc = 4. AC = Eliquis   COPD (chronic obstructive pulmonary disease) (HCC)   Accidental fall   Closed fracture of left distal radius   Dementia without behavioral disturbance (HCC)   Pubic ramus fracture, left, closed, initial encounter ([place])   Hip fracture ([place])   Protein-calorie malnutrition, severe  Patient is doing well postop.  Patient had a nondisplaced right inferior pubic ramus fracture upon  admission.  I explained to the patient's son and the physical therapist that this will not require any surgical intervention.  The patient may weight-bear as tolerated of the right lower extremity.  Patient may weight-bear as tolerated on the left lower extremity as well now that he has intramedullary fixation for his left hip fracture.  Patient had a CT scan of the lumbar spine upon admission which shows increased loss of vertebral height and retropulsion of 7 mm of the L2 vertebral body compared to a previous MRI in March.  Patient has moderate spinal stenosis at this level.  Patient has multiple chronic compression fractures and multilevel degenerative spondylosis.  Patient is not having any neurologic deficits.  He has intact sensation and intact motor function of the lower extremities.  He may continue with physical therapy as ordered.  If he demonstrates any neurologic deficits during physical therapy, neurosurgery will be consulted.  Continue wearing left wrist splint.  He may remove  his left sling to use a platform walker with physical therapy.  Patient may not weight-bear through the left wrist however.  Patient will restart his Eliquis today.  Patient's hemoglobin is 8.0.  Recheck tomorrow.  Patient has stable vital signs at this time.    Thornton Park , MD 01/30/2021, 7:22 PM

## 2021-01-30 NOTE — Plan of Care (Signed)
Pt able to wake up easier today

## 2021-01-30 NOTE — Progress Notes (Addendum)
PROGRESS NOTE    [name]  HMC:[id number] DOB: [birth year] DOA: 01/24/2021 PCP: [name], MD  Assessment & Plan:   Principal Problem:   Closed left hip fracture, initial encounter [facility]) Active Problems:   Hx of CABG   Essential hypertension   OSA on CPAP   PAF (paroxysmal atrial fibrillation) (HCC) CHA2DS2-VASc = 4. AC = Eliquis   COPD (chronic obstructive pulmonary disease) (HCC)   Accidental fall   Closed fracture of left distal radius   Dementia without behavioral disturbance (HCC)   Pubic ramus fracture, left, closed, initial encounter ([place])   Hip fracture ([place])   Protein-calorie malnutrition, severe   Left intertrochanteric hip fracture, left radius fracture, fracture of right inferior pubic ramus, acute on subacute L2 compression fracture: S/p  intramedullary nail intertrochanteric left hip fracture and open reduction internal fixation of distal radius fracture.  Norco, fentanyl prn for pain   PAF: continue on metoprolol, eliquis   ACD: no need for a transfusion currently   COPD: w/o exacerbation. Continue on bronchodilators   OSA: continue on CPAP qhs   Lewy body dementia: continue on home dose of donepezil   Severe protein calorie malnutrition: continue w/ nutritional supplements   DVT prophylaxis: eliquis  Code Status: full Family Communication: discussed pt's care w/ pt's son at bedside and answered his questions  Disposition Plan: depends on PT/OT recs   Level of care: Med-Surg   Status is: Inpatient  Remains inpatient appropriate because:Unsafe d/c plan, IV treatments appropriate due to intensity of illness or inability to take PO and Inpatient level of care appropriate due to severity of illness   Dispo: The patient is from: Home              Anticipated d/c is to: SNF              Patient currently is medically stable to d/c.   Difficult to place patient : unclear    Consultants:     Procedures:    Antimicrobials:     Subjective: Pt c/o malaise   Objective: Vitals:   01/29/21 1949 01/29/21 2328 01/30/21 0409 01/30/21 0742  BP: 110/79 116/64 124/72 115/73  Pulse: 98 98 95 96  Resp: 16 16 16 19   Temp: 97.8 F (36.6 C) 98.2 F (36.8 C) 97.8 F (36.6 C) 97.9 F (36.6 C)  TempSrc: Oral Oral Oral Axillary  SpO2: 98% 99% 95% 100%  Weight:      Height:        Intake/Output Summary (Last 24 hours) at 01/30/2021 0750 Last data filed at 01/30/2021 0407 Gross per 24 hour  Intake 1053.13 ml  Output 600 ml  Net 453.13 ml   Filed Weights   01/24/21 2028 01/25/21 1429 01/29/21 0720  Weight: 68.9 kg 66.2 kg 66.2 kg    Examination:  General exam: Appears calm and comfortable  Respiratory system: Clear to auscultation. Respiratory effort normal. Cardiovascular system: S1 & S2 +. No  rubs, gallops or clicks.  Gastrointestinal system: Abdomen is nondistended, soft and nontender. Normal bowel sounds heard. Central nervous system: Alert and awake. Moves all extremities  Psychiatry: Judgement and insight appear abnormal.  Flat mood & affect     Data Reviewed: I have personally reviewed following labs and imaging studies  CBC: Recent Labs  Lab 01/24/21 2041 01/26/21 0418 01/27/21 0432 01/28/21 0502 01/29/21 0524 01/30/21 0423  WBC 10.2 9.5 11.0* 9.9 10.4 9.7  NEUTROABS 7.1  --   --   --   --   --  HGB 11.3* 9.3* 8.8* 8.5* 8.4* 8.0*  HCT 33.9* 27.8* 27.1* 25.5* 24.9* 24.0*  MCV 95.0 93.3 94.4 93.4 94.7 94.1  PLT 158 121* 124* 128* 150 315   Basic Metabolic Panel: Recent Labs  Lab 01/24/21 2041 01/30/21 0423  NA 139 139  K 4.1 4.3  CL 104 109  CO2 26 21*  GLUCOSE 107* 120*  BUN 16 26*  CREATININE 0.87 0.67  CALCIUM 9.0 8.5*   GFR: Estimated Creatinine Clearance: 63.2 mL/min (by C-G formula based on SCr of 0.67 mg/dL). Liver Function Tests: No results for input(s): AST, ALT, ALKPHOS, BILITOT, PROT, ALBUMIN in the last 168 hours. No results for input(s): LIPASE, AMYLASE in the  last 168 hours. No results for input(s): AMMONIA in the last 168 hours. Coagulation Profile: Recent Labs  Lab 01/25/21 1638  INR 1.6*   Cardiac Enzymes: No results for input(s): CKTOTAL, CKMB, CKMBINDEX, TROPONINI in the last 168 hours. BNP (last 3 results) No results for input(s): PROBNP in the last 8760 hours. HbA1C: No results for input(s): HGBA1C in the last 72 hours. CBG: No results for input(s): GLUCAP in the last 168 hours. Lipid Profile: No results for input(s): CHOL, HDL, LDLCALC, TRIG, CHOLHDL, LDLDIRECT in the last 72 hours. Thyroid Function Tests: No results for input(s): TSH, T4TOTAL, FREET4, T3FREE, THYROIDAB in the last 72 hours. Anemia Panel: No results for input(s): VITAMINB12, FOLATE, FERRITIN, TIBC, IRON, RETICCTPCT in the last 72 hours. Sepsis Labs: No results for input(s): PROCALCITON, LATICACIDVEN in the last 168 hours.  Recent Results (from the past 240 hour(s))  Resp Panel by RT-PCR (Flu A&B, Covid) Nasopharyngeal Swab     Status: None   Collection Time: 01/24/21  1:43 AM   Specimen: Nasopharyngeal Swab; Nasopharyngeal(NP) swabs in vial transport medium  Result Value Ref Range Status   SARS Coronavirus 2 by RT PCR NEGATIVE NEGATIVE Final    Comment: (NOTE) SARS-CoV-2 target nucleic acids are NOT DETECTED.  The SARS-CoV-2 RNA is generally detectable in upper respiratory specimens during the acute phase of infection. The lowest concentration of SARS-CoV-2 viral copies this assay can detect is 138 copies/mL. A negative result does not preclude SARS-Cov-2 infection and should not be used as the sole basis for treatment or other patient management decisions. A negative result may occur with  improper specimen collection/handling, submission of specimen other than nasopharyngeal swab, presence of viral mutation(s) within the areas targeted by this assay, and inadequate number of viral copies(<138 copies/mL). A negative result must be combined  with clinical observations, patient history, and epidemiological information. The expected result is Negative.  Fact Sheet for Patients:  EntrepreneurPulse.com.au  Fact Sheet for Healthcare Providers:  IncredibleEmployment.be  This test is no t yet approved or cleared by the [place] FDA and  has been authorized for detection and/or diagnosis of SARS-CoV-2 by FDA under an Emergency Use Authorization (EUA). This EUA will remain  in effect (meaning this test can be used) for the duration of the COVID-19 declaration under Section 564(b)(1) of the Act, 21 U.S.C.section 360bbb-3(b)(1), unless the authorization is terminated  or revoked sooner.       Influenza A by PCR NEGATIVE NEGATIVE Final   Influenza B by PCR NEGATIVE NEGATIVE Final    Comment: (NOTE) The Xpert Xpress SARS-CoV-2/FLU/RSV plus assay is intended as an aid in the diagnosis of influenza from Nasopharyngeal swab specimens and should not be used as a sole basis for treatment. Nasal washings and aspirates are unacceptable for Xpert Xpress SARS-CoV-2/FLU/RSV testing.  Fact Sheet for Patients: EntrepreneurPulse.com.au  Fact Sheet for Healthcare Providers: IncredibleEmployment.be  This test is not yet approved or cleared by the [place] FDA and has been authorized for detection and/or diagnosis of SARS-CoV-2 by FDA under an Emergency Use Authorization (EUA). This EUA will remain in effect (meaning this test can be used) for the duration of the COVID-19 declaration under Section 564(b)(1) of the Act, 21 U.S.C. section 360bbb-3(b)(1), unless the authorization is terminated or revoked.  Performed at [facility], [address] [place], [place] [address]   MRSA PCR Screening     Status: None   Collection Time: 01/28/21 10:51 AM   Specimen: Nasopharyngeal  Result Value Ref Range Status   MRSA by PCR NEGATIVE NEGATIVE Final     Comment:        The GeneXpert MRSA Assay (FDA approved for NASAL specimens only), is one component of a comprehensive MRSA colonization surveillance program. It is not intended to diagnose MRSA infection nor to guide or monitor treatment for MRSA infections. Performed at [facility], [address] [place],  [address]          Radiology Studies: DG Wrist Complete Left  Result Date: 01/29/2021 CLINICAL DATA:  Status post left wrist ORIF EXAM: LEFT WRIST - COMPLETE 3+ VIEW COMPARISON:  01/24/2021 left wrist radiographs FINDINGS: Status post transfixation of distal metadiaphysis left radius fracture in near-anatomic alignment by a volar surgical plate with multiple interlocking screws with no hardware fracture. Volar skin staples. Generalized soft tissue swelling and expected scattered soft tissue gas in the volar left wrist soft tissues. No dislocation at the left wrist. Minimally distally displaced ulnar styloid fracture. Moderate first carpometacarpal joint osteoarthritis. No suspicious focal osseous lesions. Healed distal left ulna shaft deformity. IMPRESSION: 1. Near-anatomic alignment of distal left radius fracture status post ORIF. 2. Minimally displaced ulnar styloid fracture. Electronically Signed   By: Ilona Sorrel M.D.   On: 01/29/2021 13:58   DG MINI C-ARM IMAGE ONLY  Result Date: 01/29/2021 There is no interpretation for this exam.  This order is for images obtained during a surgical procedure.  Please See "Surgeries" Tab for more information regarding the procedure.   DG HIP OPERATIVE UNILAT W OR W/O PELVIS LEFT  Result Date: 01/29/2021 CLINICAL DATA:  Intramedullary nail fixation for fracture EXAM: OPERATIVE LEFT HIP  2 VIEWS TECHNIQUE: Fluoroscopic spot image(s) were submitted for interpretation post-operatively. FLUOROSCOPY TIME:  1 minutes 30 seconds; 4 acquired images COMPARISON:  Preoperative study Jan 24, 2021 FINDINGS: Frontal and lateral views show  nail fixation through an intertrochanteric femur fracture on the left with alignment essentially anatomic at the fracture site. Tip of screw in proximal left femoral head. No other fracture. No dislocation. Mild narrowing left hip joint. IMPRESSION: Screw and nail fixation through intertrochanteric femur fracture with alignment fracture site essentially anatomic. Screw tip in proximal femoral head. No new fracture. No dislocation. Mild narrowing left hip joint. Electronically Signed   By: Lowella Grip III M.D.   On: 01/29/2021 09:51   DG FEMUR PORT MIN 2 VIEWS LEFT  Result Date: 01/29/2021 CLINICAL DATA:  Status post left intramedullary nail EXAM: LEFT FEMUR PORTABLE 2 VIEWS COMPARISON:  01/29/2021 left hip radiographs FINDINGS: Status post transfixation of intertrochanteric proximal left femur fracture in near-anatomic alignment by intramedullary rod with interlocking left femoral neck pin and 2 distal interlocking screws with no hardware fracture. Skin staples lateral to the distal left femur. No suspicious osseous lesions. Vascular calcifications in  the posterior soft tissues. No dislocation at the left knee or left hip on these views. Partially visualized vascular stent in the left pelvis. IMPRESSION: Near-anatomic alignment of intertrochanteric proximal left femur fracture status post ORIF. Electronically Signed   By: Ilona Sorrel M.D.   On: 01/29/2021 13:55        Scheduled Meds: . acetaminophen  500 mg Oral Q6H  . acetaminophen  650 mg Oral Once  . apixaban  5 mg Oral BID  . bacitracin  1 application Topical QHS  . cholecalciferol  2,000 Units Oral Daily  . docusate sodium  100 mg Oral BID  . donepezil  10 mg Oral QHS  . ezetimibe  10 mg Oral Daily  . feeding supplement  237 mL Oral BID BM  . finasteride  5 mg Oral Daily  . fluticasone  2 spray Each Nare Daily  . lisinopril  2.5 mg Oral BID  . magnesium oxide  400 mg Oral QPM  . metoprolol tartrate  25 mg Oral BID  . mirabegron  ER  50 mg Oral Daily  . mirtazapine  7.5 mg Oral QHS  . multivitamin with minerals  1 tablet Oral Daily  . polyvinyl alcohol  1 drop Both Eyes QHS  . rosuvastatin  20 mg Oral Daily  . senna  1 tablet Oral BID  . traZODone  50 mg Oral QHS   Continuous Infusions: . methocarbamol (ROBAXIN) IV       LOS: 6 days    Time spent: 33 mins     Wyvonnia Dusky, MD [facility] Pager [phone]  If 7PM-7AM, please contact night-coverage 01/30/2021, 7:50 AM

## 2021-01-31 DIAGNOSIS — G3183 Dementia with Lewy bodies: Secondary | ICD-10-CM | POA: Diagnosis not present

## 2021-01-31 DIAGNOSIS — S72002A Fracture of unspecified part of neck of left femur, initial encounter for closed fracture: Secondary | ICD-10-CM | POA: Diagnosis not present

## 2021-01-31 DIAGNOSIS — I480 Paroxysmal atrial fibrillation: Secondary | ICD-10-CM | POA: Diagnosis not present

## 2021-01-31 DIAGNOSIS — F0280 Dementia in other diseases classified elsewhere without behavioral disturbance: Secondary | ICD-10-CM | POA: Diagnosis not present

## 2021-01-31 LAB — BASIC METABOLIC PANEL
Anion gap: 7 (ref 5–15)
BUN: 31 mg/dL — ABNORMAL HIGH (ref 8–23)
CO2: 26 mmol/L (ref 22–32)
Calcium: 8.4 mg/dL — ABNORMAL LOW (ref 8.9–10.3)
Chloride: 106 mmol/L (ref 98–111)
Creatinine, Ser: 0.77 mg/dL (ref 0.61–1.24)
GFR, Estimated: >60 mL/min (ref >60)
Glucose, Bld: 112 mg/dL — ABNORMAL HIGH (ref 70–99)
Potassium: 4.1 mmol/L (ref 3.5–5.1)
Sodium: 139 mmol/L (ref 135–145)

## 2021-01-31 LAB — CBC
HCT: 22.6 % — ABNORMAL LOW (ref 39.0–52.0)
Hemoglobin: 7.4 g/dL — ABNORMAL LOW (ref 13.0–17.0)
MCH: 31.5 pg (ref 26.0–34.0)
MCHC: 32.7 g/dL (ref 30.0–36.0)
MCV: 96.2 fL (ref 80.0–100.0)
Platelets: 179 10*3/uL (ref 150–400)
RBC: 2.35 MIL/uL — ABNORMAL LOW (ref 4.22–5.81)
RDW: 16.5 % — ABNORMAL HIGH (ref 11.5–15.5)
WBC: 12 10*3/uL — ABNORMAL HIGH (ref 4.0–10.5)
nRBC: 0 % (ref 0.0–0.2)

## 2021-01-31 MED ORDER — BOOST PLUS PO LIQD
237.0000 mL | Freq: Three times a day (TID) | ORAL | Status: DC
  Filled 2021-01-31: qty 237

## 2021-01-31 MED ORDER — BOOST / RESOURCE BREEZE PO LIQD CUSTOM
1.0000 | Freq: Three times a day (TID) | ORAL | Status: DC
  Administered 2021-01-31 – 2021-02-03 (×5): 1 via ORAL

## 2021-01-31 MED ORDER — SODIUM CHLORIDE 0.9 % IV BOLUS
1000.0000 mL | Freq: Once | INTRAVENOUS | Status: AC
  Administered 2021-01-31: 1000 mL via INTRAVENOUS

## 2021-01-31 NOTE — Discharge Instructions (Signed)
[facility] Stay Proper nutrition can help your body recover from illness and injury.   Foods and beverages high in protein, vitamins, and minerals help rebuild muscle loss, promote healing, & reduce fall risk.   .In addition to eating healthy foods, a nutrition shake is an easy, delicious way to get the nutrition you need during and after your hospital stay  It is recommended that you continue to drink 2 bottles per day of: Boost for at least 1 month (30 days) after your hospital stay and/or when you do not feel like eating a full meal.  Tips for adding a nutrition shake into your routine: As allowed, drink one with vitamins or medications instead of water or juice Enjoy one as a tasty mid-morning or afternoon snack Drink cold or make a milkshake out of it Drink one instead of milk with cereal or snacks Use as a coffee creamer   Available at the following grocery stores and pharmacies:           * [facility] [facility] [phone]            For COUPONS visit: www.ensure.com/join or http://dawson-may.com/   Suggested Substitutions Ensure Plus = Boost Plus = Carnation Breakfast Essentials = Boost Compact Ensure Active Clear = Boost Breeze Glucerna Shake = Boost Glucose Control = Carnation Breakfast Essentials SUGAR FREE  Suggestions For Increasing Calories And Protein . Several small meals a day are easier to eat and digest than three large ones. Space meals about 2 to 3 hours apart to maximize comfort. . Stop eating 2 to 3 hours before bed and sleep with your head elevated if gastric reflux (GERD) and heartburn are problems. . Do not eat your favorite foods if you are feeling bad. Save them for when you feel good! . Eat breakfast-type foods at any meal. Eggs are usually easy to eat and are great any time of the  day. (The same goes for pancakes and waffles.) . Eat when you feel hungry. Most people have the greatest appetite in the morning because they have not eaten all night. If this is the best meal for you, then pile on those calories and other nutrients in the morning and at lunch. Then you can have a smaller dinner without losing total calories for the day. . Eat leftovers or nutritious snacks in the afternoon and early evening to round out your day. . Try homemade or commercially prepared nutrition bars and puddings, as well as calorie- and protein-rich liquid nutritional supplements. Benefits of Physical Activity Talk to your doctor about physical activity. Light or moderate physical activity can help maintain muscle and promote an appetite. Walking in the neighborhood or the local mall is a great way to get up, get out, and get moving. If you are unsteady on your feet, try walking around the dining room table. Save Room for [employer]! Drink most fluids between meals instead of with meals. (It is fine to have a sip to help swallow food at meal time.) Fluids (which usually have fewer calories and nutrients than solid food) can take up valuable space in your stomach.  Foods Recommended High-Protein Foods Milk products Add cheese to toast, crackers, sandwiches, baked  potatoes, vegetables, soups, noodles, meat, and fruit. Use reduced-fat (2%) or whole milk in place of water when cooking cereal and cream soups. Include cream sauces on vegetables and pasta. Add powdered milk to cream soups and mashed potatoes.  Eggs Have hard-cooked eggs readily available in the refrigerator. Chop and add to salads, casseroles, soups, and vegetables. Make a quick egg salad. All eggs should be well cooked to avoid the risk of harmful bacteria.  Meats, poultry, and fish Add leftover cooked meats to soups, casseroles, salads, and omelets. Make dip by mixing diced, chopped, or shredded meat with sour cream and spices.   Beans, legumes, nuts, and seeds Sprinkle nuts and seeds on cereals, fruit, and desserts such as ice cream, pudding, and custard. Also serve nuts and seeds on vegetables, salads, and pasta. Spread peanut butter on toast, bread, English muffins, and fruit, or blend it in a milk shake. Add beans and peas to salads, soups, casseroles, and vegetable dishes.  High-Calorie Foods Butter, margarine, and  oils Melt butter or margarine over potatoes, rice, pasta, and cooked vegetables. Add melted butter or margarine into soups and casseroles and spread on bread for sandwiches before spreading sandwich spread or peanut butter. Saut or stir-fry vegetables, meats, chicken and fish such as shrimp/scallops in olive or canola oil. A variety of oils add calories and can be used to [employer], chicken, or fish.  Milk products Add whipping cream to desserts, pancakes, waffles, fruit, and hot chocolate, and fold it into soups and casseroles. Add sour cream to baked potatoes and vegetables.  Salad dressing Use regular (not low-fat or diet) mayonnaise and salad dressing on sandwiches and in dips with vegetables and fruit.   Sweets Add jelly and honey to bread and crackers. Add jam to fruit and ice cream and as a topping over cake.   Copyright 2020  [employer]y [employer]. All rights reserved.

## 2021-01-31 NOTE — Progress Notes (Addendum)
PROGRESS NOTE    [name]  ZOX:[id number] DOB: Dec 17, 1933 DOA: 01/24/2021 PCP: [name], MD  Assessment & Plan:   Principal Problem:   Closed left hip fracture, initial encounter [facility]) Active Problems:   Hx of CABG   Essential hypertension   OSA on CPAP   PAF (paroxysmal atrial fibrillation) (HCC) CHA2DS2-VASc = 4. AC = Eliquis   COPD (chronic obstructive pulmonary disease) (HCC)   Accidental fall   Closed fracture of left distal radius   Dementia without behavioral disturbance (HCC)   Pubic ramus fracture, left, closed, initial encounter ([place])   Hip fracture ([place])   Protein-calorie malnutrition, severe   Left intertrochanteric hip fracture, left radius fracture, fracture of right inferior pubic ramus, acute on subacute L2 compression fracture: S/p intramedullary nail intertrochanteric left hip fracture and open reduction internal fixation of distal radius fracture.  Norco, fentanyl prn for pain   PAF: continue on eliquis, metoprolol   ACD: will transfuse if Hb <7.0. Will continue to monitor    COPD: w/o exacerbation. Continue on bronchodilators and encourage incentive spirometry   OSA: continue on CPAP qhs   Lewy body dementia: continue on home dose of donepezil   Severe protein calorie malnutrition: continue w/ nutritional supplements   DVT prophylaxis: eliquis  Code Status: full Family Communication: discussed pt's care w/ pt's son at bedside and answered his questions  Disposition Plan: likely d/c to SNF  Level of care: Med-Surg   Status is: Inpatient  Remains inpatient appropriate because:Unsafe d/c plan, IV treatments appropriate due to intensity of illness or inability to take PO and Inpatient level of care appropriate due to severity of illness awaiting on SNF placement    Dispo: The patient is from: Home              Anticipated d/c is to: SNF              Patient currently is medically stable to d/c.   Difficult to place patient :  unclear    Consultants:     Procedures:    Antimicrobials:    Subjective: Pt c/o fatigue   Objective: Vitals:   01/30/21 2025 01/31/21 0011 01/31/21 0537 01/31/21 0540  BP: 98/61 109/70 111/68   Pulse: 88 88 71 69  Resp: 18 18 18    Temp: 98.3 F (36.8 C) 97.7 F (36.5 C) 97.8 F (36.6 C)   TempSrc: Oral Oral Oral   SpO2: 100% 95% (!) 82% 95%  Weight:      Height:        Intake/Output Summary (Last 24 hours) at 01/31/2021 0722 Last data filed at 01/31/2021 0557 Gross per 24 hour  Intake 0 ml  Output 400 ml  Net -400 ml   Filed Weights   01/24/21 2028 01/25/21 1429 01/29/21 0720  Weight: 68.9 kg 66.2 kg 66.2 kg    Examination:  General exam: Appears comfortable  Respiratory system: clear breath sounds b/l  Cardiovascular system: S1/S2+. No clicks or rubs   Gastrointestinal system: Abd is soft, NT, ND & hypoactive bowel sounds  Central nervous system: Alert and awake. Moves all extremities  Psychiatry: Judgement and insight appear abnormal. Flat mood and affect     Data Reviewed: I have personally reviewed following labs and imaging studies  CBC: Recent Labs  Lab 01/24/21 2041 01/26/21 0418 01/27/21 0432 01/28/21 0502 01/29/21 0524 01/30/21 0423 01/31/21 0222  WBC 10.2   < > 11.0* 9.9 10.4 9.7 12.0*  NEUTROABS 7.1  --   --   --   --   --   --  HGB 11.3*   < > 8.8* 8.5* 8.4* 8.0* 7.4*  HCT 33.9*   < > 27.1* 25.5* 24.9* 24.0* 22.6*  MCV 95.0   < > 94.4 93.4 94.7 94.1 96.2  PLT 158   < > 124* 128* 150 152 179   < > = values in this interval not displayed.   Basic Metabolic Panel: Recent Labs  Lab 01/24/21 2041 01/30/21 0423 01/31/21 0222  NA 139 139 139  K 4.1 4.3 4.1  CL 104 109 106  CO2 26 21* 26  GLUCOSE 107* 120* 112*  BUN 16 26* 31*  CREATININE 0.87 0.67 0.77  CALCIUM 9.0 8.5* 8.4*   GFR: Estimated Creatinine Clearance: 63.2 mL/min (by C-G formula based on SCr of 0.77 mg/dL). Liver Function Tests: No results for input(s): AST,  ALT, ALKPHOS, BILITOT, PROT, ALBUMIN in the last 168 hours. No results for input(s): LIPASE, AMYLASE in the last 168 hours. No results for input(s): AMMONIA in the last 168 hours. Coagulation Profile: Recent Labs  Lab 01/25/21 1638  INR 1.6*   Cardiac Enzymes: No results for input(s): CKTOTAL, CKMB, CKMBINDEX, TROPONINI in the last 168 hours. BNP (last 3 results) No results for input(s): PROBNP in the last 8760 hours. HbA1C: No results for input(s): HGBA1C in the last 72 hours. CBG: No results for input(s): GLUCAP in the last 168 hours. Lipid Profile: No results for input(s): CHOL, HDL, LDLCALC, TRIG, CHOLHDL, LDLDIRECT in the last 72 hours. Thyroid Function Tests: No results for input(s): TSH, T4TOTAL, FREET4, T3FREE, THYROIDAB in the last 72 hours. Anemia Panel: No results for input(s): VITAMINB12, FOLATE, FERRITIN, TIBC, IRON, RETICCTPCT in the last 72 hours. Sepsis Labs: No results for input(s): PROCALCITON, LATICACIDVEN in the last 168 hours.  Recent Results (from the past 240 hour(s))  Resp Panel by RT-PCR (Flu A&B, Covid) Nasopharyngeal Swab     Status: None   Collection Time: 01/24/21  1:43 AM   Specimen: Nasopharyngeal Swab; Nasopharyngeal(NP) swabs in vial transport medium  Result Value Ref Range Status   SARS Coronavirus 2 by RT PCR NEGATIVE NEGATIVE Final    Comment: (NOTE) SARS-CoV-2 target nucleic acids are NOT DETECTED.  The SARS-CoV-2 RNA is generally detectable in upper respiratory specimens during the acute phase of infection. The lowest concentration of SARS-CoV-2 viral copies this assay can detect is 138 copies/mL. A negative result does not preclude SARS-Cov-2 infection and should not be used as the sole basis for treatment or other patient management decisions. A negative result may occur with  improper specimen collection/handling, submission of specimen other than nasopharyngeal swab, presence of viral mutation(s) within the areas targeted by this  assay, and inadequate number of viral copies(<138 copies/mL). A negative result must be combined with clinical observations, patient history, and epidemiological information. The expected result is Negative.  Fact Sheet for Patients:  EntrepreneurPulse.com.au  Fact Sheet for Healthcare Providers:  IncredibleEmployment.be  This test is no t yet approved or cleared by the [place] FDA and  has been authorized for detection and/or diagnosis of SARS-CoV-2 by FDA under an Emergency Use Authorization (EUA). This EUA will remain  in effect (meaning this test can be used) for the duration of the COVID-19 declaration under Section 564(b)(1) of the Act, 21 U.S.C.section 360bbb-3(b)(1), unless the authorization is terminated  or revoked sooner.       Influenza A by PCR NEGATIVE NEGATIVE Final   Influenza B by PCR NEGATIVE NEGATIVE Final    Comment: (NOTE) The Xpert Xpress SARS-CoV-2/FLU/RSV plus assay  is intended as an aid in the diagnosis of influenza from Nasopharyngeal swab specimens and should not be used as a sole basis for treatment. Nasal washings and aspirates are unacceptable for Xpert Xpress SARS-CoV-2/FLU/RSV testing.  Fact Sheet for Patients: EntrepreneurPulse.com.au  Fact Sheet for Healthcare Providers: IncredibleEmployment.be  This test is not yet approved or cleared by the [place] FDA and has been authorized for detection and/or diagnosis of SARS-CoV-2 by FDA under an Emergency Use Authorization (EUA). This EUA will remain in effect (meaning this test can be used) for the duration of the COVID-19 declaration under Section 564(b)(1) of the Act, 21 U.S.C. section 360bbb-3(b)(1), unless the authorization is terminated or revoked.  Performed at [facility], [address] [place], [place] [address]   MRSA PCR Screening     Status: None   Collection Time: 01/28/21 10:51 AM    Specimen: Nasopharyngeal  Result Value Ref Range Status   MRSA by PCR NEGATIVE NEGATIVE Final    Comment:        The GeneXpert MRSA Assay (FDA approved for NASAL specimens only), is one component of a comprehensive MRSA colonization surveillance program. It is not intended to diagnose MRSA infection nor to guide or monitor treatment for MRSA infections. Performed at [facility], [address] [place], [place] [address]          Radiology Studies: DG Wrist Complete Left  Result Date: 01/29/2021 CLINICAL DATA:  Status post left wrist ORIF EXAM: LEFT WRIST - COMPLETE 3+ VIEW COMPARISON:  01/24/2021 left wrist radiographs FINDINGS: Status post transfixation of distal metadiaphysis left radius fracture in near-anatomic alignment by a volar surgical plate with multiple interlocking screws with no hardware fracture. Volar skin staples. Generalized soft tissue swelling and expected scattered soft tissue gas in the volar left wrist soft tissues. No dislocation at the left wrist. Minimally distally displaced ulnar styloid fracture. Moderate first carpometacarpal joint osteoarthritis. No suspicious focal osseous lesions. Healed distal left ulna shaft deformity. IMPRESSION: 1. Near-anatomic alignment of distal left radius fracture status post ORIF. 2. Minimally displaced ulnar styloid fracture. Electronically Signed   By: Ilona Sorrel M.D.   On: 01/29/2021 13:58   DG HIP OPERATIVE UNILAT W OR W/O PELVIS LEFT  Result Date: 01/29/2021 CLINICAL DATA:  Intramedullary nail fixation for fracture EXAM: OPERATIVE LEFT HIP  2 VIEWS TECHNIQUE: Fluoroscopic spot image(s) were submitted for interpretation post-operatively. FLUOROSCOPY TIME:  1 minutes 30 seconds; 4 acquired images COMPARISON:  Preoperative study Jan 24, 2021 FINDINGS: Frontal and lateral views show nail fixation through an intertrochanteric femur fracture on the left with alignment essentially anatomic at the fracture site. Tip of  screw in proximal left femoral head. No other fracture. No dislocation. Mild narrowing left hip joint. IMPRESSION: Screw and nail fixation through intertrochanteric femur fracture with alignment fracture site essentially anatomic. Screw tip in proximal femoral head. No new fracture. No dislocation. Mild narrowing left hip joint. Electronically Signed   By: Lowella Grip III M.D.   On: 01/29/2021 09:51   DG FEMUR PORT MIN 2 VIEWS LEFT  Result Date: 01/29/2021 CLINICAL DATA:  Status post left intramedullary nail EXAM: LEFT FEMUR PORTABLE 2 VIEWS COMPARISON:  01/29/2021 left hip radiographs FINDINGS: Status post transfixation of intertrochanteric proximal left femur fracture in near-anatomic alignment by intramedullary rod with interlocking left femoral neck pin and 2 distal interlocking screws with no hardware fracture. Skin staples lateral to the distal left femur. No suspicious osseous lesions. Vascular calcifications in the posterior soft  tissues. No dislocation at the left knee or left hip on these views. Partially visualized vascular stent in the left pelvis. IMPRESSION: Near-anatomic alignment of intertrochanteric proximal left femur fracture status post ORIF. Electronically Signed   By: Ilona Sorrel M.D.   On: 01/29/2021 13:55        Scheduled Meds: . acetaminophen  650 mg Oral Once  . apixaban  5 mg Oral BID  . bacitracin  1 application Topical QHS  . cholecalciferol  2,000 Units Oral Daily  . docusate sodium  100 mg Oral BID  . donepezil  10 mg Oral QHS  . ezetimibe  10 mg Oral Daily  . feeding supplement  237 mL Oral BID BM  . finasteride  5 mg Oral Daily  . fluticasone  2 spray Each Nare Daily  . lisinopril  2.5 mg Oral BID  . magnesium oxide  400 mg Oral QPM  . metoprolol tartrate  25 mg Oral BID  . mirabegron ER  50 mg Oral Daily  . mirtazapine  7.5 mg Oral QHS  . multivitamin with minerals  1 tablet Oral Daily  . polyvinyl alcohol  1 drop Both Eyes QHS  . rosuvastatin  20  mg Oral Daily  . senna  1 tablet Oral BID  . traZODone  50 mg Oral QHS   Continuous Infusions: . methocarbamol (ROBAXIN) IV       LOS: 7 days    Time spent: 30 mins     Wyvonnia Dusky, MD [facility] Pager [phone]  If 7PM-7AM, please contact night-coverage 01/31/2021, 7:22 AM

## 2021-01-31 NOTE — Progress Notes (Signed)
Nutrition Follow-up  DOCUMENTATION CODES:  Severe malnutrition in context of chronic illness  INTERVENTION:  Continue current diet order.  Discontinue Ensure Enlive BID.  Add Boost Plus po TID, each supplement provides 360 kcal and 14 grams of protein.  Add Magic cup TID with meals, each supplement provides 290 kcal and 9 grams of protein.  Continue MVI with minerals daily.  NUTRITION DIAGNOSIS:  Severe Malnutrition (in the context of chrinic illness) related to poor appetite as evidenced by percent weight loss,severe muscle depletion,severe fat depletion (15.7% x 3 months). - ongoing  GOAL:  Patient will meet greater than or equal to 90% of their needs - not meeting  MONITOR:  PO intake,Supplement acceptance,Weight trends,Skin  REASON FOR ASSESSMENT:  Consult Hip fracture protocol  ASSESSMENT:  Pt presented to ED from independent living facility following a fall on his left side with immediate pain in his hip and wrist. Imaging in ED showed distal radial fracture, and mildly displaced intertrochanteric left hip fracture/ nondisplaced fracture right inferior pubic ramus. Pt on eliquis at baseline, will require washout prior to surgical intervention. Past medical history significant for atrial fibrillation, COPD, HTN, CAD s/p CABG, dementia, colon cancer, GERD. 5/31 - left IM nail intertrochanteric, open reduction internal fixation distal radial fx, I&D of L wrist wound and removal of plate; diet advanced to regular  Spoke with son at bedside. Son reports that pt is starting to feel better and that his appetite is starting to come back. He reports that the food is fabulous but pt prefers more salt added to foods, so he believes pt will eat better when food is prepared for him at home. Per Epic, pt not eating well 0-30% of only 4 documented meals in the last 6 days. Pt does not like the Ensure being sent.  Admit wt: 68.9 kg Current wt: 66.2 kg  Recommend discontinuing Ensure and  adding Boost Plus TID and Magic Cup TID to meals, as son reports pt loves chocolate and ice cream. Continue MVI with minerals daily.  Medications: reviewed; Vitamin D3, colace BID, EE BID, Mag-Ox 400 mg, MVI with minerals, Senokot BID, Vicodin PRN (given once today)  Labs: reviewed; Glucose 112, serum Ca 8.4  Diet Order:   Diet Order            Diet regular Room service appropriate? Yes; Fluid consistency: Thin  Diet effective now                EDUCATION NEEDS:  No education needs have been identified at this time  Skin:  Skin Assessment: Skin Integrity Issues: Skin Integrity Issues:: Incisions Incisions: L hip, closed and L wrist, closed  Last BM:  01/28/21; OBR  Height:  Ht Readings from Last 1 Encounters:  01/29/21 6' (1.829 m)   Weight:  Wt Readings from Last 1 Encounters:  01/29/21 66.2 kg   Ideal Body Weight:  80.9 kg  BMI:  Body mass index is 19.79 kg/m.  Estimated Nutritional Needs:  Kcal:  1900-2100 kcal/d Protein:  95-110 g/d Fluid:  >2 L/d  Derrel Nip, RD, LDN Registered Dietitian After Hours/Weekend Pager # in [place]

## 2021-01-31 NOTE — TOC Progression Note (Signed)
Transition of Care [facility]) - Progression Note    Patient Details  Name: [name] MRN: [id number] Date of Birth: May 09, 1934  Transition of Care [facility] CM/SW George, RN Phone Number: 01/31/2021, 10:20 AM  Clinical Narrative:     Spoke with the son [name] and he stated that he feels that he would like the patient to go back to ALF but understands he will get more PT at a SNF and agrees to a bed search to have the patient go to SNF. I will complete the bedsearch and call the son with offers once obtained       Expected Discharge Plan and Services                                                 Social Determinants of Health (SDOH) Interventions    Readmission Risk Interventions Readmission Risk Prevention Plan 12/28/2019 05/19/2019  Transportation Screening Complete Complete  HRI or [place] - Complete  Social Work Consult for [place] Planning/Counseling - Complete  Palliative Care Screening - Not Applicable  Medication Review Press photographer) Complete Complete  HRI or Home Care Consult Complete -  [place] Complete -  Some recent data might be hidden

## 2021-01-31 NOTE — Care Management Important Message (Signed)
Important Message  Patient Details  Name: [name] MRN: [id number] Date of Birth: [birth year]   Medicare Important Message Given:  Yes  Per RN Case manager she directed me to call his son, [name] ([phone]) to review the Important Message from Medicare.  I reviewed the form by phone and he is in agreement with the discharge plan. I asked if he would like me to send him a copy of the form and he replied no.  I thanked him for his time.  Juliann Pulse A Delaila Nand 01/31/2021, 3:10 PM

## 2021-01-31 NOTE — Progress Notes (Signed)
Subjective:  POD #1 s/p left IM fixation for left intertrochanteric hip fracture and revision ORIF of left radius fracture.   Patient reports left hip and wrist pain as mild.  He complains of mild lumbar back pain when he attempts to lay on his left side.  Patient son is at the bedside.  Patient was unable to get up out of bed to a chair today with physical therapy.  Patient has had a bowel movement.  His hemoglobin today is 7.4.  His vital signs remained stable.  Objective:   VITALS:   Vitals:   01/31/21 0537 01/31/21 0540 01/31/21 0755 01/31/21 1216  BP: 111/68  106/67 97/73  Pulse: 71 69 74 84  Resp: 18  16 16   Temp: 97.8 F (36.6 C)  97.7 F (36.5 C) 98.3 F (36.8 C)  TempSrc: Oral  Oral Oral  SpO2: (!) 82% 95% 92% 100%  Weight:      Height:        PHYSICAL EXAM: Left lower extremity Neurovascular intact Sensation intact distally Intact pulses distally Dorsiflexion/Plantar flexion intact Honeycomb dressings: scant serosanguineous drainage No cellulitis present Compartment soft  Left upper extremity: Splint and dressing are clean dry and intact.  Patient has sling on.  He can flex and extend his fingers on the left hand and his fingers well-perfused.  His compartments are soft and compressible.    LABS  Results for orders placed or performed during the hospital encounter of 01/24/21 (from the past 24 hour(s))  CBC     Status: Abnormal   Collection Time: 01/31/21  2:22 AM  Result Value Ref Range   WBC 12.0 (H) 4.0 - 10.5 K/uL   RBC 2.35 (L) 4.22 - 5.81 MIL/uL   Hemoglobin 7.4 (L) 13.0 - 17.0 g/dL   HCT 22.6 (L) 39.0 - 52.0 %   MCV 96.2 80.0 - 100.0 fL   MCH 31.5 26.0 - 34.0 pg   MCHC 32.7 30.0 - 36.0 g/dL   RDW 16.5 (H) 11.5 - 15.5 %   Platelets 179 150 - 400 K/uL   nRBC 0.0 0.0 - 0.2 %  Basic metabolic panel     Status: Abnormal   Collection Time: 01/31/21  2:22 AM  Result Value Ref Range   Sodium 139 135 - 145 mmol/L   Potassium 4.1 3.5 - 5.1 mmol/L    Chloride 106 98 - 111 mmol/L   CO2 26 22 - 32 mmol/L   Glucose, Bld 112 (H) 70 - 99 mg/dL   BUN 31 (H) 8 - 23 mg/dL   Creatinine, Ser 0.77 0.61 - 1.24 mg/dL   Calcium 8.4 (L) 8.9 - 10.3 mg/dL   GFR, Estimated >60 >60 mL/min   Anion gap 7 5 - 15   *Note: Due to a large number of results and/or encounters for the requested time period, some results have not been displayed. A complete set of results can be found in Results Review.    No results found.  Assessment/Plan: 2 Days Post-Op   Principal Problem:   Closed left hip fracture, initial encounter ([place]) Active Problems:   Hx of CABG   Essential hypertension   OSA on CPAP   PAF (paroxysmal atrial fibrillation) (HCC) CHA2DS2-VASc = 4. AC = Eliquis   COPD (chronic obstructive pulmonary disease) (HCC)   Accidental fall   Closed fracture of left distal radius   Dementia without behavioral disturbance (HCC)   Pubic ramus fracture, left, closed, initial encounter ([place])   Hip fracture (  [place])   Protein-calorie malnutrition, severe  Patient doing well postop.  Continue with physical therapy.  Patient is weightbearing as tolerated on the left lower extremity.  He must use a platform walker for the left upper extremity as he is nonweightbearing through the left wrist..  Patient may remove his sling while in bed or with physical therapy but I recommend he use the sling while sitting in a chair.  Patient will benefit from a skilled nursing facility stay upon discharge.  Continue Eliquis.  Recheck labs in the a.m. to monitor patient's hemoglobin and hematocrit.  Discussed the patient's hemoglobin of 7.4 today with Dr. Jimmye Norman.  We agreed that since the patient is hemodynamically stable with stable vital signs there is no indication for transfusion at this time.   [place] , MD 01/31/2021, 3:28 PM

## 2021-01-31 NOTE — NC FL2 (Signed)
[place] LEVEL OF CARE SCREENING TOOL     IDENTIFICATION  Patient Name: [name] Birthdate: [birth year] Sex: male Admission Date (Current Location): 01/24/2021  [place] and [place] Number:  Engineering geologist and Address:  [facility], [address], [place], [place] [address]      Provider Number: [id number]  Attending Physician Name and Address:  Wyvonnia Dusky, MD  Relative Name and Phone Number:  [name] SOn [phone]    Current Level of Care: Hospital Recommended Level of Care: [place] Prior Approval Number:    Date Approved/Denied:   PASRR Number: [id number]  Discharge Plan: SNF    Current Diagnoses: Patient Active Problem List   Diagnosis Date Noted  . Protein-calorie malnutrition, severe 01/25/2021  . Accidental fall 01/24/2021  . Closed left hip fracture, initial encounter ([place]) 01/24/2021  . Closed fracture of left distal radius 01/24/2021  . Dementia without behavioral disturbance ([place]) 01/24/2021  . Pubic ramus fracture, left, closed, initial encounter ([place]) 01/24/2021  . Hip fracture ([place]) 01/24/2021  . AAA (abdominal aortic aneurysm) without rupture ([place]) 06/29/2020  . LBD (Lewy body dementia) ([place]) 05/14/2020  . Numbness and tingling in left hand 03/02/2020  . Hematuria   . Acute kidney injury superimposed on CKD ([place])   . Sepsis due to gram-negative UTI ([place]) 12/26/2019  . Left nephrolithiasis 12/26/2019  . Hydronephrosis, left 12/26/2019  . Status post abdominal aortic aneurysm (AAA) repair 04/15/2019  . Do not intubate, cardiopulmonary resuscitation (CPR)-only code status 10/08/2018  . Prostate cancer ([place]) 02/25/2018  . Prostatic cyst 01/15/2018  . Blood in stool   . Abnormal feces   . Stricture and stenosis of esophagus   . Mild cognitive impairment 08/18/2017  . Senile purpura ([place]) 04/29/2017  . Anemia 04/29/2017  . Frequent falls 04/29/2017  . Simple chronic  bronchitis ([place]) 12/17/2016  . Benign localized hyperplasia of prostate with urinary obstruction 07/15/2016  . Elevated PSA 07/15/2016  . Incomplete emptying of bladder 07/15/2016  . Urge incontinence 07/15/2016  . Hypothyroidism 04/25/2016  . Macular degeneration 04/25/2016  . Erectile dysfunction due to arterial insufficiency   . Ischemic heart disease with chronotropic incompetence   . CN (constipation) 02/10/2015  . DD (diverticular disease) 02/10/2015  . Weakness 02/10/2015  . Lichen planus 97/94/8016  . Restless leg 02/10/2015  . 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 B12 deficiency 02/10/2015  . COPD (chronic obstructive pulmonary disease) ([place]) 11/14/2014  . Post Inflammatory Lung Changes 11/14/2014  . PAF (paroxysmal atrial fibrillation) (HCC) CHA2DS2-VASc = 4. AC = Eliquis   . Insomnia 12/09/2013  . OSA on CPAP   . Dyspnea on exertion - -essentially resolved with BB dose reduction & wgt loss 03/29/2013  . Overweight (BMI 25.0-29.9) -- Wgt back up 01/17/2013  . Hx of CABG   . Essential hypertension   . Hyperlipidemia with target LDL less than 70   . Aorto-iliac disease ([place])   . BCC (basal cell carcinoma), eyelid 01/03/2013  . Allergic rhinitis 08/17/2009  . Adaptation reaction 05/28/2009  . Acid reflux 05/28/2009  . Arthritis, degenerative 05/28/2009  . 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 Carotid artery occlusion without infarction 06/01/2006  . PAD (peripheral artery disease) - bilateral common iliac stents 12/15/2005  . Atherosclerotic heart disease of artery bypass graft 09/01/1997    Orientation RESPIRATION BLADDER Height & Weight     Self,Place  Normal Incontinent Weight: 66.2 kg Height:  6' (182.9 cm)  BEHAVIORAL  SYMPTOMS/MOOD NEUROLOGICAL BOWEL NUTRITION STATUS      Continent Diet (regular)  AMBULATORY STATUS COMMUNICATION OF NEEDS Skin   Extensive Assist Verbally Surgical wounds                       Personal Care Assistance Level  of Assistance  Bathing,Dressing Bathing Assistance: Limited assistance   Dressing Assistance: Limited assistance     Functional Limitations Info  Hearing,Sight Sight Info: Impaired Hearing Info: Impaired      SPECIAL CARE FACTORS FREQUENCY  PT (By licensed PT)     PT Frequency: 5 times per week              Contractures Contractures Info: Not present    Additional Factors Info  Code Status,Allergies Code Status Info: Full code Allergies Info: Clonazepam, Codeine, Ropinirole, Hydrocodone, Niacin And Related, Oxycodone, Tramadol           Current Medications (01/31/2021):  This is the current hospital active medication list Current Facility-Administered Medications  Medication Dose Route Frequency Provider Last Rate Last Admin  . acetaminophen (TYLENOL) tablet 325-650 mg  325-650 mg Oral Q6H PRN Thornton Park, MD      . acetaminophen (TYLENOL) tablet 650 mg  650 mg Oral Once Thornton Park, MD      . alum & mag hydroxide-simeth (MAALOX/MYLANTA) 200-200-20 MG/5ML suspension 30 mL  30 mL Oral Q4H PRN Thornton Park, MD      . apixaban Arne [place]) tablet 5 mg  5 mg Oral BID Thornton Park, MD   5 mg at 01/31/21 3818  . bacitracin ointment 1 application  1 application Topical QHS Thornton Park, MD   1 application at 29/93/71 2211  . bisacodyl (DULCOLAX) EC tablet 5 mg  5 mg Oral Daily PRN Thornton Park, MD      . bisacodyl (DULCOLAX) suppository 10 mg  10 mg Rectal Daily PRN Thornton Park, MD      . cholecalciferol (VITAMIN D3) tablet 2,000 Units  2,000 Units Oral Daily Thornton Park, MD   2,000 Units at 01/31/21 [phone]  . docusate sodium (COLACE) capsule 100 mg  100 mg Oral BID Thornton Park, MD   100 mg at 01/31/21 8938  . donepezil (ARICEPT) tablet 10 mg  10 mg Oral QHS Thornton Park, MD   10 mg at 01/30/21 2211  . ezetimibe (ZETIA) tablet 10 mg  10 mg Oral Daily Thornton Park, MD   10 mg at 01/31/21 0929  . feeding supplement (ENSURE ENLIVE /  ENSURE PLUS) liquid 237 mL  237 mL Oral BID BM Thornton Park, MD   237 mL at 01/30/21 1330  . fentaNYL (SUBLIMAZE) injection 25 mcg  25 mcg Intravenous Q2H PRN Thornton Park, MD   25 mcg at 01/25/21 1155  . finasteride (PROSCAR) tablet 5 mg  5 mg Oral Daily Thornton Park, MD   5 mg at 01/30/21 1017  . fluticasone (FLONASE) 50 MCG/ACT nasal spray 2 spray  2 spray Each Nare Daily Thornton Park, MD   2 spray at 01/30/21 [phone]  . HYDROcodone-acetaminophen (NORCO/VICODIN) 5-325 MG per tablet 1-2 tablet  1-2 tablet Oral Q4H PRN Thornton Park, MD   1 tablet at 01/31/21 [phone]  . lisinopril (ZESTRIL) tablet 2.5 mg  2.5 mg Oral BID Thornton Park, MD   2.5 mg at 01/31/21 0919  . LORazepam (ATIVAN) tablet 0.5 mg  0.5 mg Oral Q8H PRN Thornton Park, MD      . magnesium citrate solution 1 Bottle  1  Bottle Oral Once PRN Thornton Park, MD      . magnesium oxide (MAG-OX) tablet 400 mg  400 mg Oral QPM Thornton Park, MD   400 mg at 01/30/21 2211  . menthol-cetylpyridinium (CEPACOL) lozenge 3 mg  1 lozenge Oral PRN Thornton Park, MD       Or  . phenol (CHLORASEPTIC) mouth spray 1 spray  1 spray Mouth/Throat PRN Thornton Park, MD      . methocarbamol (ROBAXIN) tablet 500 mg  500 mg Oral Q6H PRN Thornton Park, MD       Or  . methocarbamol (ROBAXIN) 500 mg in dextrose 5 % 50 mL IVPB  500 mg Intravenous Q6H PRN Thornton Park, MD      . metoprolol tartrate (LOPRESSOR) tablet 25 mg  25 mg Oral BID Thornton Park, MD   25 mg at 01/31/21 6045  . mirabegron ER (MYRBETRIQ) tablet 50 mg  50 mg Oral Daily Thornton Park, MD   50 mg at 01/31/21 0929  . mirtazapine (REMERON) tablet 7.5 mg  7.5 mg Oral QHS Thornton Park, MD   7.5 mg at 01/30/21 2211  . morphine 2 MG/ML injection 0.5-1 mg  0.5-1 mg Intravenous Q2H PRN Thornton Park, MD      . multivitamin with minerals tablet 1 tablet  1 tablet Oral Daily Thornton Park, MD   1 tablet at 01/31/21 0919  . ondansetron (ZOFRAN) tablet 4  mg  4 mg Oral Q6H PRN Thornton Park, MD       Or  . ondansetron [facility]) injection 4 mg  4 mg Intravenous Q6H PRN Thornton Park, MD      . polyethylene glycol (MIRALAX / GLYCOLAX) packet 17 g  17 g Oral Daily PRN Thornton Park, MD      . polyvinyl alcohol (LIQUIFILM TEARS) 1.4 % ophthalmic solution 1 drop  1 drop Both Eyes QHS Jennye Boroughs, MD   1 drop at 01/30/21 2213  . rosuvastatin (CRESTOR) tablet 20 mg  20 mg Oral Daily Thornton Park, MD   20 mg at 01/31/21 4098  . senna (SENOKOT) tablet 8.6 mg  1 tablet Oral BID Thornton Park, MD   8.6 mg at 01/31/21 0920  . traZODone (DESYREL) tablet 50 mg  50 mg Oral QHS Thornton Park, MD         Discharge Medications: Please see discharge summary for a list of discharge medications.  Relevant Imaging Results:  Relevant Lab Results:   Additional Information ss # [id number]  [name], RN

## 2021-01-31 NOTE — Progress Notes (Signed)
Physical Therapy Treatment Patient Details Name: [name] MRN: [id number] DOB: 01-Aug-1934 Today's Date: 01/31/2021    History of Present Illness [name] is an 78yoM who comes to [facility] on 5/26 after a fall and subsequent hip pain. Imaging revealing fractures of Left wrist and left hip, as well as Rt inferior pubic ramus. Wrist fracute is directly adjacent to prior ORIF volar plating. Lumbar films show acute on subacute L2 compression fracture with 72mm bony retropulsion with "moderate spinal stenosis now seen at this level". PMH: compression fracture and vertebroplasty at T12, L3, L4, L5, PAF on eliquis, COPD, HTN, CAD s/p CABG, OSA on CPAP, Parkinsonism, lewy body dementia, AAA s/p endoprosthesis. After eliquis washout, pt went to OR on 5/31 for Left hip ORIF IM nailing, Left distal radius ORIF. Pt is WBAT at LLE and NWB LUE. Presurgical Hb: 11.3 down to 8.0 on POD1.    PT Comments    Pt inc large loose BM.  Tech in to assist with care.  Full bath and linen change performed with assisting with rolling as able.  Overall participates as he is able to with care.  Sitting EOB x 10 minutes until fatigued.   Son in for session.  Discussed ALF vs SNF and level of therapy he will receive at both.  SNF remains best option for discharge to get pt to PLOF or max mobility.  Son agrees after session.   Follow Up Recommendations  SNF     Equipment Recommendations  None recommended by PT    Recommendations for Other Services       Precautions / Restrictions Precautions Precautions: Fall Restrictions Weight Bearing Restrictions: Yes LUE Weight Bearing: Non weight bearing RLE Weight Bearing: Weight bearing as tolerated LLE Weight Bearing: Weight bearing as tolerated    Mobility  Bed Mobility Overal bed mobility: Needs Assistance Bed Mobility: Rolling Rolling: Mod assist;Max assist   Supine to sit: Mod assist Sit to supine: Mod assist;Max assist   General bed mobility comments:  attempts to assist as he is able with cues and increased time    Transfers                    Ambulation/Gait                 Stairs             Wheelchair Mobility    Modified Rankin (Stroke Patients Only)       Balance Overall balance assessment: Needs assistance Sitting-balance support: Feet supported Sitting balance-Leahy Scale: Fair                                      Cognition Arousal/Alertness: Awake/alert Behavior During Therapy: WFL for tasks assessed/performed Overall Cognitive Status: History of cognitive impairments - at baseline                                        Exercises Other Exercises Other Exercises: pt inc loose BM.  Assisted tech with care    General Comments        Pertinent Vitals/Pain Pain Assessment: Faces Faces Pain Scale: Hurts even more Pain Location: L LE and R LE Pain Descriptors / Indicators: Grimacing;Guarding Pain Intervention(s): Limited activity within patient's tolerance;Monitored during session;Premedicated before session    Home Living  Prior Function            PT Goals (current goals can now be found in the care plan section) Progress towards PT goals: Progressing toward goals    Frequency    7X/week      PT Plan Current plan remains appropriate    Co-evaluation              AM-PAC PT "6 Clicks" Mobility   Outcome Measure  Help needed turning from your back to your side while in a flat bed without using bedrails?: A Lot Help needed moving from lying on your back to sitting on the side of a flat bed without using bedrails?: A Lot Help needed moving to and from a bed to a chair (including a wheelchair)?: Total Help needed standing up from a chair using your arms (e.g., wheelchair or bedside chair)?: Total Help needed to walk in hospital room?: Total Help needed climbing 3-5 steps with a railing? : Total 6 Click  Score: 8    End of Session   Activity Tolerance: Patient tolerated treatment well Patient left: in bed;with family/visitor present;with bed alarm set Nurse Communication: Mobility status PT Visit Diagnosis: Unsteadiness on feet (R26.81);Difficulty in walking, not elsewhere classified (R26.2);Muscle weakness (generalized) (M62.81);Other abnormalities of gait and mobility (R26.89)     Time: 1121-6244 PT Time Calculation (min) (ACUTE ONLY): 34 min  Charges:  $Therapeutic Activity: 23-37 mins          [name], PTA 01/31/21, 11:12 AM

## 2021-02-01 DIAGNOSIS — S72002A Fracture of unspecified part of neck of left femur, initial encounter for closed fracture: Secondary | ICD-10-CM | POA: Diagnosis not present

## 2021-02-01 DIAGNOSIS — F0280 Dementia in other diseases classified elsewhere without behavioral disturbance: Secondary | ICD-10-CM | POA: Diagnosis not present

## 2021-02-01 DIAGNOSIS — G3183 Dementia with Lewy bodies: Secondary | ICD-10-CM | POA: Diagnosis not present

## 2021-02-01 DIAGNOSIS — I480 Paroxysmal atrial fibrillation: Secondary | ICD-10-CM | POA: Diagnosis not present

## 2021-02-01 LAB — BASIC METABOLIC PANEL
Anion gap: 8 (ref 5–15)
BUN: 24 mg/dL — ABNORMAL HIGH (ref 8–23)
CO2: 23 mmol/L (ref 22–32)
Calcium: 8.3 mg/dL — ABNORMAL LOW (ref 8.9–10.3)
Chloride: 107 mmol/L (ref 98–111)
Creatinine, Ser: 0.78 mg/dL (ref 0.61–1.24)
GFR, Estimated: >60 mL/min (ref >60)
Glucose, Bld: 102 mg/dL — ABNORMAL HIGH (ref 70–99)
Potassium: 4.1 mmol/L (ref 3.5–5.1)
Sodium: 138 mmol/L (ref 135–145)

## 2021-02-01 LAB — CBC
HCT: 23.3 % — ABNORMAL LOW (ref 39.0–52.0)
Hemoglobin: 7.5 g/dL — ABNORMAL LOW (ref 13.0–17.0)
MCH: 31.1 pg (ref 26.0–34.0)
MCHC: 32.2 g/dL (ref 30.0–36.0)
MCV: 96.7 fL (ref 80.0–100.0)
Platelets: 207 10*3/uL (ref 150–400)
RBC: 2.41 MIL/uL — ABNORMAL LOW (ref 4.22–5.81)
RDW: 16.7 % — ABNORMAL HIGH (ref 11.5–15.5)
WBC: 10.3 10*3/uL (ref 4.0–10.5)
nRBC: 0 % (ref 0.0–0.2)

## 2021-02-01 LAB — SARS CORONAVIRUS 2 (TAT 6-24 HRS): SARS Coronavirus 2: NEGATIVE

## 2021-02-01 NOTE — Progress Notes (Signed)
Physical Therapy Treatment Patient Details Name: [name] MRN: [id number] DOB: 1933-12-14 Today's Date: 02/01/2021    History of Present Illness [name] is an 53yoM who comes to [facility] on 5/26 after a fall and subsequent hip pain. Imaging revealing fractures of Left wrist and left hip, as well as Rt inferior pubic ramus. Wrist fracute is directly adjacent to prior ORIF volar plating. Lumbar films show acute on subacute L2 compression fracture with 27mm bony retropulsion with "moderate spinal stenosis now seen at this level". PMH: compression fracture and vertebroplasty at T12, L3, L4, L5, PAF on eliquis, COPD, HTN, CAD s/p CABG, OSA on CPAP, Parkinsonism, lewy body dementia, AAA s/p endoprosthesis. After eliquis washout, pt went to OR on 5/31 for Left hip ORIF IM nailing, Left distal radius ORIF. Pt is WBAT at LLE and NWB LUE. Presurgical Hb: 11.3 down to 8.0 on POD1.    PT Comments    Participated in exercises as described below.  To EOB and attempted to stand x 3 but unable to clear hips off bed despite extensive assist.  He is a bit more lethargic today and less engaged in session but still puts in good effort as he is able.      Follow Up Recommendations  SNF     Equipment Recommendations  None recommended by PT    Recommendations for Other Services       Precautions / Restrictions Precautions Precautions: Fall Restrictions Weight Bearing Restrictions: Yes LUE Weight Bearing: Non weight bearing RLE Weight Bearing: Weight bearing as tolerated LLE Weight Bearing: Weight bearing as tolerated    Mobility  Bed Mobility Overal bed mobility: Needs Assistance Bed Mobility: Rolling Rolling: Mod assist;Max assist   Supine to sit: Mod assist;Max assist Sit to supine: Mod assist;+2 for physical assistance   General bed mobility comments: increased assist needed today with some post pushing during transition    Transfers Overall transfer level: Needs  assistance Equipment used: Left platform walker Transfers: Sit to/from Stand Sit to Stand: Mod assist;Max assist;+2 physical assistance         General transfer comment: 3 attempts each improving but never able to stand fully upright despite asssit  Ambulation/Gait             General Gait Details: unable   Stairs             Wheelchair Mobility    Modified Rankin (Stroke Patients Only)       Balance Overall balance assessment: Needs assistance Sitting-balance support: Feet supported Sitting balance-Leahy Scale: Poor Sitting balance - Comments: some increased supervision needed today and occasional tactile cues to remain upright   Standing balance support: Bilateral upper extremity supported Standing balance-Leahy Scale: Zero Standing balance comment: mod/max a x 2 and unable to achieve full standing.                            Cognition Arousal/Alertness: Awake/alert Behavior During Therapy: WFL for tasks assessed/performed Overall Cognitive Status: History of cognitive impairments - at baseline                                 General Comments: a bit more lethargic today but does particiapte and engage in session      Exercises Other Exercises Other Exercises: BLE AAROM x 10 before mobility    General Comments  Pertinent Vitals/Pain Pain Assessment: Faces Faces Pain Scale: Hurts even more Pain Location: L LE and R LE Pain Descriptors / Indicators: Grimacing;Guarding Pain Intervention(s): Limited activity within patient's tolerance;Monitored during session;Premedicated before session    Home Living                      Prior Function            PT Goals (current goals can now be found in the care plan section) Progress towards PT goals: Progressing toward goals    Frequency    7X/week      PT Plan Current plan remains appropriate    Co-evaluation              AM-PAC PT "6 Clicks"  Mobility   Outcome Measure  Help needed turning from your back to your side while in a flat bed without using bedrails?: A Lot Help needed moving from lying on your back to sitting on the side of a flat bed without using bedrails?: Total Help needed moving to and from a bed to a chair (including a wheelchair)?: Total Help needed standing up from a chair using your arms (e.g., wheelchair or bedside chair)?: Total Help needed to walk in hospital room?: Total Help needed climbing 3-5 steps with a railing? : Total 6 Click Score: 7    End of Session   Activity Tolerance: Patient tolerated treatment well Patient left: in bed;with family/visitor present;with bed alarm set Nurse Communication: Mobility status PT Visit Diagnosis: Unsteadiness on feet (R26.81);Difficulty in walking, not elsewhere classified (R26.2);Muscle weakness (generalized) (M62.81);Other abnormalities of gait and mobility (R26.89)     Time: 1020-1037 PT Time Calculation (min) (ACUTE ONLY): 17 min  Charges:  $Therapeutic Activity: 8-22 mins                    [name], PTA 02/01/21, 11:20 AM

## 2021-02-01 NOTE — Progress Notes (Addendum)
Subjective:  Patient seen with male family member at the bedside.  POD #3 s/p ORIF of left distal radius and intramedullary fixation of left intertrochanteric hip fracture.  Family member states patient has been more confused today.  Patient restless in the bed but no acute distress.  Is unable to provide an accurate history.  Patient's hemoglobin remained stable compared to yesterday.  Vital signs are stable.  Objective:   VITALS:   Vitals:   01/31/21 2018 02/01/21 0347 02/01/21 0816 02/01/21 1128  BP: 105/64 119/78 116/74 111/70  Pulse: 70 80 94 88  Resp:  14 16 18   Temp: 98.3 F (36.8 C) 98 F (36.7 C) 98.2 F (36.8 C) 98 F (36.7 C)  TempSrc: Oral Oral Oral Oral  SpO2: 95% 93% 98% 100%  Weight:      Height:        PHYSICAL EXAM: Left upper extremity: Splint and dressings clean dry and intact.  Neurovascular intact.  Left lower extremity: Neurovascular intact Intact pulses distally Dorsiflexion/Plantar flexion intact Dressings: scant drainage No cellulitis present Compartment soft  LABS  Results for orders placed or performed during the hospital encounter of 01/24/21 (from the past 24 hour(s))  CBC     Status: Abnormal   Collection Time: 02/01/21  5:40 AM  Result Value Ref Range   WBC 10.3 4.0 - 10.5 K/uL   RBC 2.41 (L) 4.22 - 5.81 MIL/uL   Hemoglobin 7.5 (L) 13.0 - 17.0 g/dL   HCT 23.3 (L) 39.0 - 52.0 %   MCV 96.7 80.0 - 100.0 fL   MCH 31.1 26.0 - 34.0 pg   MCHC 32.2 30.0 - 36.0 g/dL   RDW 16.7 (H) 11.5 - 15.5 %   Platelets 207 150 - 400 K/uL   nRBC 0.0 0.0 - 0.2 %  Basic metabolic panel     Status: Abnormal   Collection Time: 02/01/21  7:53 AM  Result Value Ref Range   Sodium 138 135 - 145 mmol/L   Potassium 4.1 3.5 - 5.1 mmol/L   Chloride 107 98 - 111 mmol/L   CO2 23 22 - 32 mmol/L   Glucose, Bld 102 (H) 70 - 99 mg/dL   BUN 24 (H) 8 - 23 mg/dL   Creatinine, Ser 0.78 0.61 - 1.24 mg/dL   Calcium 8.3 (L) 8.9 - 10.3 mg/dL   GFR, Estimated >60 >60  mL/min   Anion gap 8 5 - 15   *Note: Due to a large number of results and/or encounters for the requested time period, some results have not been displayed. A complete set of results can be found in Results Review.    No results found.  Assessment/Plan: 3 Days Post-Op   Principal Problem:   Closed left hip fracture, initial encounter ([place]) Active Problems:   Hx of CABG   Essential hypertension   OSA on CPAP   PAF (paroxysmal atrial fibrillation) (HCC) CHA2DS2-VASc = 4. AC = Eliquis   COPD (chronic obstructive pulmonary disease) (HCC)   Accidental fall   Closed fracture of left distal radius   Dementia without behavioral disturbance (HCC)   Pubic ramus fracture, left, closed, initial encounter ([place])   Hip fracture (HCC)   Protein-calorie malnutrition, severe  Continue PT as patient can tolerate.  Patient is nonweightbearing on the left wrist and must use a platform walker.  He is weightbearing as tolerated left lower extremity.  Patient will follow-up in my office in 10 to 14 days after discharge from the hospital.  [facility]  Patient should be discharged on Lovenox 40 mg daily or enteric-coated aspirin 325 mg p.o. twice daily for DVT prophylaxis until follow-up.    Thornton Park , MD 02/01/2021, 1:58 PM

## 2021-02-01 NOTE — TOC Progression Note (Signed)
Transition of Care [facility]) - Progression Note    Patient Details  Name: [name] MRN: [id number] Date of Birth: 05/15/1934  Transition of Care [facility]) CM/SW Contact  [name], RN Phone Number: 02/01/2021, 10:25 AM  Clinical Narrative:   [name] with [name] the patient's son on the phone and reviewed bed offers and Medicare star rating, He chose [facility]. I notified [facility] at [facility] and the physician, The patient is fully vaccinated        Expected Discharge Plan and Services                                                 Social Determinants of Health (SDOH) Interventions    Readmission Risk Interventions Readmission Risk Prevention Plan 12/28/2019 05/19/2019  Transportation Screening Complete Complete  HRI or [place] - Complete  Social Work Consult for [place] Planning/Counseling - Complete  Palliative Care Screening - Not Applicable  Medication Review Press photographer) Complete Complete  HRI or Home Care Consult Complete -  [place] Complete -  Some recent data might be hidden

## 2021-02-01 NOTE — Progress Notes (Signed)
Occupational Therapy Treatment Patient Details Name: [name] MRN: [id number] DOB: 12/04/1933 Today's Date: 02/01/2021    History of present illness [name] is an 87yoM who comes to [facility] on 5/26 after a fall and subsequent hip pain. Imaging revealing fractures of Left wrist and left hip, as well as Rt inferior pubic ramus. Wrist fracute is directly adjacent to prior ORIF volar plating. Lumbar films show acute on subacute L2 compression fracture with 7mm bony retropulsion with "moderate spinal stenosis now seen at this level". PMH: compression fracture and vertebroplasty at T12, L3, L4, L5, PAF on eliquis, COPD, HTN, CAD s/p CABG, OSA on CPAP, Parkinsonism, lewy body dementia, AAA s/p endoprosthesis. After eliquis washout, pt went to OR on 5/31 for Left hip ORIF IM nailing, Left distal radius ORIF. Pt is WBAT at LLE and NWB LUE. Presurgical Hb: 11.3 down to 8.0 on POD1.   OT comments  Pt seen for OT tx this date to f/u re: safety with ADLs/ADL mobility. Pt declines to attempt coming to EOB sitting with OT this date. OT engages pt in lateral rolling and notes that pt has wet linens and has pulled his male purewick off (family member present in room reports that he had pulled several items off of himself when she'd stepped out earlier today as well). Pt agreeable to rolling in bed and requires MOD A +2 for safety. Pt requires MAX A for posterior peri care in rolling and requires SETUP for anterior peri care in supine with HOB slightly elevated and verbal cues for thorough completion. Pt left with all needs met and in reach. RN and CNA aware of session contents. Will continue to follow acutely. Continue to anticipate that pt will require STR f/u.    Follow Up Recommendations  SNF;Supervision/Assistance - 24 hour    Equipment Recommendations  Other (comment) (defer)    Recommendations for Other Services      Precautions / Restrictions Precautions Precautions:  Fall Restrictions Weight Bearing Restrictions: Yes LUE Weight Bearing: Non weight bearing RLE Weight Bearing: Weight bearing as tolerated LLE Weight Bearing: Weight bearing as tolerated       Mobility Bed Mobility Overal bed mobility: Needs Assistance Bed Mobility: Rolling Rolling: Mod assist;Max assist;+2 for safety/equipment         General bed mobility comments: increased time, multimodal cues to sequence    Transfers                 General transfer comment: deferred    Balance       Sitting balance - Comments: pt declines coming to sitting despite OT's efforts to encourage                                   ADL either performed or assessed with clinical judgement   ADL Overall ADL's : Needs assistance/impaired                 Upper Body Dressing : Minimal assistance;Bed level           Toileting- Clothing Manipulation and Hygiene: Moderate assistance;+2 for safety/equipment;Bed level Toileting - Clothing Manipulation Details (indicate cue type and reason): rolling with MIN tactile/verbal cues, MOD A for posterior, pt able to complete anterior with SETUP.             Vision Baseline Vision/History: Wears glasses Wears Glasses: At all times Patient Visual Report: No change from baseline       Perception     Praxis      Cognition Arousal/Alertness: Awake/alert Behavior During Therapy: WFL for tasks assessed/performed Overall Cognitive Status: History of cognitive impairments - at baseline                                 General Comments: a bit more lethargic today but does particiapte and engage in session        Exercises Other Exercises Other Exercises: OT engages pt in lateral rolling, bed level to assist with cleaning linens d/t uring incontinence and pt had removed his male purewick. In addition, OT engages pt in peri care.   Shoulder Instructions       General Comments      Pertinent  Vitals/ Pain       Pain Assessment: Faces Faces Pain Scale: Hurts even more Pain Location: L LE and R LE Pain Descriptors / Indicators: Grimacing;Guarding Pain Intervention(s): Limited activity within patient's tolerance;Monitored during session;Repositioned  Home Living                                          Prior Functioning/Environment              Frequency  Min 1X/week        Progress Toward Goals  OT Goals(current goals can now be found in the care plan section)  Progress towards OT goals: Progressing toward goals  Acute Rehab OT Goals Patient Stated Goal: return to baseline mobility OT Goal Formulation: With patient Time For Goal Achievement: 02/13/21 Potential to Achieve Goals: [place] Discharge plan remains appropriate;Frequency remains appropriate    Co-evaluation                 AM-PAC OT "6 Clicks" Daily Activity     Outcome Measure   Help from another person eating meals?: A Little Help from another person taking care of personal grooming?: A Lot Help from another person toileting, which includes using toliet, bedpan, or urinal?: A Lot Help from another person bathing (including washing, rinsing, drying)?: A Lot Help from another person to put on and taking off regular upper body clothing?: A Lot Help from another person to put on and taking off regular lower body clothing?: Total 6 Click Score: 12    End of Session    OT Visit Diagnosis: Unsteadiness on feet (R26.81);Muscle weakness (generalized) (M62.81);History of falling (Z91.81);Repeated falls (R29.6);Pain Pain - Right/Left: Left Pain - part of body: Arm;Hip   Activity Tolerance Patient limited by pain   Patient Left in bed;with call bell/phone within reach;with bed alarm set;with family/visitor present   Nurse Communication Mobility status;Patient requests pain meds        Time: 6720-9470 OT Time Calculation (min): 29 min  Charges: OT General  Charges $OT Visit: 1 Visit OT Treatments $Self Care/Home Management : 8-22 mins $Therapeutic Activity: 8-22 mins  Gerrianne Scale, MS, OTR/L ascom [phone] 02/01/21, 7:02 PM

## 2021-02-01 NOTE — Progress Notes (Signed)
PROGRESS NOTE    [name]  OZH:[id number] DOB: 12-May-1934 DOA: 01/24/2021 PCP: [name], MD  Assessment & Plan:   Principal Problem:   Closed left hip fracture, initial encounter [facility]) Active Problems:   Hx of CABG   Essential hypertension   OSA on CPAP   PAF (paroxysmal atrial fibrillation) (HCC) CHA2DS2-VASc = 4. AC = Eliquis   COPD (chronic obstructive pulmonary disease) (HCC)   Accidental fall   Closed fracture of left distal radius   Dementia without behavioral disturbance (HCC)   Pubic ramus fracture, left, closed, initial encounter ([place])   Hip fracture ([place])   Protein-calorie malnutrition, severe   Left intertrochanteric hip fracture, left radius fracture, fracture of right inferior pubic ramus, acute on subacute L2 compression fracture: S/p intramedullary nail intertrochanteric left hip fracture and open reduction internal fixation of distal radius fracture.  Norco, fentanyl prn for pain. PT/OT recs SNF but therapy has been unable to get the pt out of bed yet   PAF: continue on metoprolol, eliquis   ACD: H&H are stable. Will transfuse if Hb <7.0.  COPD: w/o exacerbation. Continue on bronchodilators and encourage incentive spirometry   OSA: previously used CPAP qhs but pt kept trying to get up independently in the middle of the night. Continue w/ oxygen qhs prn   Lewy body dementia: continue on home dose of donepezil   Severe protein calorie malnutrition: continue w/ nutritional supplements    DVT prophylaxis: eliquis  Code Status: full Family Communication: discussed pt's care w/ pt's son at bedside and answered his questions  Disposition Plan: likely d/c to SNF  Level of care: Med-Surg   Status is: Inpatient  Remains inpatient appropriate because:Unsafe d/c plan, IV treatments appropriate due to intensity of illness or inability to take PO and Inpatient level of care appropriate due to severity of illness, will likely be d/c to SNF  tomorrow if Hb is stable and/or trending up    Dispo: The patient is from: Home              Anticipated d/c is to: SNF              Patient currently is not currently medically stable for d/c    Difficult to place patient : unclear    Consultants:     Procedures:    Antimicrobials:    Subjective: Pt is confused and does not answer questions appropriately   Objective: Vitals:   01/31/21 1631 01/31/21 1648 01/31/21 2018 02/01/21 0347  BP: (!) 82/63 95/64 105/64 119/78  Pulse: 79  70 80  Resp:    14  Temp: 97.9 F (36.6 C)  98.3 F (36.8 C) 98 F (36.7 C)  TempSrc: Oral  Oral Oral  SpO2: 100%  95% 93%  Weight:      Height:        Intake/Output Summary (Last 24 hours) at 02/01/2021 0726 Last data filed at 01/31/2021 2331 Gross per 24 hour  Intake 226.67 ml  Output 300 ml  Net -73.33 ml   Filed Weights   01/24/21 2028 01/25/21 1429 01/29/21 0720  Weight: 68.9 kg 66.2 kg 66.2 kg    Examination:  General exam: Appears calm & comfortable  Respiratory system: clear breath sounds b/l. No rales  Cardiovascular system: S1/S2+. No gallops or rubs    Gastrointestinal system: Abd is soft, NT, ND & normal bowel sounds  Central nervous system: Alert and awake. Moves all extremities  Psychiatry: Judgement and insight  appear abnormal. Flat mood and affect    Data Reviewed: I have personally reviewed following labs and imaging studies  CBC: Recent Labs  Lab 01/28/21 0502 01/29/21 0524 01/30/21 0423 01/31/21 0222 02/01/21 0540  WBC 9.9 10.4 9.7 12.0* 10.3  HGB 8.5* 8.4* 8.0* 7.4* 7.5*  HCT 25.5* 24.9* 24.0* 22.6* 23.3*  MCV 93.4 94.7 94.1 96.2 96.7  PLT 128* 150 152 179 163   Basic Metabolic Panel: Recent Labs  Lab 01/30/21 0423 01/31/21 0222  NA 139 139  K 4.3 4.1  CL 109 106  CO2 21* 26  GLUCOSE 120* 112*  BUN 26* 31*  CREATININE 0.67 0.77  CALCIUM 8.5* 8.4*   GFR: Estimated Creatinine Clearance: 63.2 mL/min (by C-G formula based on SCr of 0.77  mg/dL). Liver Function Tests: No results for input(s): AST, ALT, ALKPHOS, BILITOT, PROT, ALBUMIN in the last 168 hours. No results for input(s): LIPASE, AMYLASE in the last 168 hours. No results for input(s): AMMONIA in the last 168 hours. Coagulation Profile: Recent Labs  Lab 01/25/21 1638  INR 1.6*   Cardiac Enzymes: No results for input(s): CKTOTAL, CKMB, CKMBINDEX, TROPONINI in the last 168 hours. BNP (last 3 results) No results for input(s): PROBNP in the last 8760 hours. HbA1C: No results for input(s): HGBA1C in the last 72 hours. CBG: No results for input(s): GLUCAP in the last 168 hours. Lipid Profile: No results for input(s): CHOL, HDL, LDLCALC, TRIG, CHOLHDL, LDLDIRECT in the last 72 hours. Thyroid Function Tests: No results for input(s): TSH, T4TOTAL, FREET4, T3FREE, THYROIDAB in the last 72 hours. Anemia Panel: No results for input(s): VITAMINB12, FOLATE, FERRITIN, TIBC, IRON, RETICCTPCT in the last 72 hours. Sepsis Labs: No results for input(s): PROCALCITON, LATICACIDVEN in the last 168 hours.  Recent Results (from the past 240 hour(s))  Resp Panel by RT-PCR (Flu A&B, Covid) Nasopharyngeal Swab     Status: None   Collection Time: 01/24/21  1:43 AM   Specimen: Nasopharyngeal Swab; Nasopharyngeal(NP) swabs in vial transport medium  Result Value Ref Range Status   SARS Coronavirus 2 by RT PCR NEGATIVE NEGATIVE Final    Comment: (NOTE) SARS-CoV-2 target nucleic acids are NOT DETECTED.  The SARS-CoV-2 RNA is generally detectable in upper respiratory specimens during the acute phase of infection. The lowest concentration of SARS-CoV-2 viral copies this assay can detect is 138 copies/mL. A negative result does not preclude SARS-Cov-2 infection and should not be used as the sole basis for treatment or other patient management decisions. A negative result may occur with  improper specimen collection/handling, submission of specimen other than nasopharyngeal swab,  presence of viral mutation(s) within the areas targeted by this assay, and inadequate number of viral copies(<138 copies/mL). A negative result must be combined with clinical observations, patient history, and epidemiological information. The expected result is Negative.  Fact Sheet for Patients:  EntrepreneurPulse.com.au  Fact Sheet for Healthcare Providers:  IncredibleEmployment.be  This test is no t yet approved or cleared by the [place] FDA and  has been authorized for detection and/or diagnosis of SARS-CoV-2 by FDA under an Emergency Use Authorization (EUA). This EUA will remain  in effect (meaning this test can be used) for the duration of the COVID-19 declaration under Section 564(b)(1) of the Act, 21 U.S.C.section 360bbb-3(b)(1), unless the authorization is terminated  or revoked sooner.       Influenza A by PCR NEGATIVE NEGATIVE Final   Influenza B by PCR NEGATIVE NEGATIVE Final    Comment: (NOTE) The Xpert Xpress  SARS-CoV-2/FLU/RSV plus assay is intended as an aid in the diagnosis of influenza from Nasopharyngeal swab specimens and should not be used as a sole basis for treatment. Nasal washings and aspirates are unacceptable for Xpert Xpress SARS-CoV-2/FLU/RSV testing.  Fact Sheet for Patients: EntrepreneurPulse.com.au  Fact Sheet for Healthcare Providers: IncredibleEmployment.be  This test is not yet approved or cleared by the [place] FDA and has been authorized for detection and/or diagnosis of SARS-CoV-2 by FDA under an Emergency Use Authorization (EUA). This EUA will remain in effect (meaning this test can be used) for the duration of the COVID-19 declaration under Section 564(b)(1) of the Act, 21 U.S.C. section 360bbb-3(b)(1), unless the authorization is terminated or revoked.  Performed at [facility], [address] [place], [place] [address]   MRSA PCR  Screening     Status: None   Collection Time: 01/28/21 10:51 AM   Specimen: Nasopharyngeal  Result Value Ref Range Status   MRSA by PCR NEGATIVE NEGATIVE Final    Comment:        The GeneXpert MRSA Assay (FDA approved for NASAL specimens only), is one component of a comprehensive MRSA colonization surveillance program. It is not intended to diagnose MRSA infection nor to guide or monitor treatment for MRSA infections. Performed at [facility], [address] [place], [place] [address]          Radiology Studies: No results found.      Scheduled Meds: . acetaminophen  650 mg Oral Once  . apixaban  5 mg Oral BID  . bacitracin  1 application Topical QHS  . cholecalciferol  2,000 Units Oral Daily  . docusate sodium  100 mg Oral BID  . donepezil  10 mg Oral QHS  . ezetimibe  10 mg Oral Daily  . feeding supplement  1 Container Oral TID BM  . finasteride  5 mg Oral Daily  . fluticasone  2 spray Each Nare Daily  . lisinopril  2.5 mg Oral BID  . magnesium oxide  400 mg Oral QPM  . metoprolol tartrate  25 mg Oral BID  . mirabegron ER  50 mg Oral Daily  . mirtazapine  7.5 mg Oral QHS  . multivitamin with minerals  1 tablet Oral Daily  . polyvinyl alcohol  1 drop Both Eyes QHS  . rosuvastatin  20 mg Oral Daily  . senna  1 tablet Oral BID  . traZODone  50 mg Oral QHS   Continuous Infusions: . methocarbamol (ROBAXIN) IV       LOS: 8 days    Time spent: 33 mins     Wyvonnia Dusky, MD [facility] Pager [phone]  If 7PM-7AM, please contact night-coverage 02/01/2021, 7:26 AM

## 2021-02-01 NOTE — Progress Notes (Signed)
Pt increasingly restless pulling at ace wrap and primofit. Attempts to rediredt and change behavior unsuccessful. Bilat mitts applied. Will monitor and admin Ativan PO as ordered if needed.

## 2021-02-02 ENCOUNTER — Inpatient Hospital Stay: Payer: Medicare Other

## 2021-02-02 DIAGNOSIS — S52592D Other fractures of lower end of left radius, subsequent encounter for closed fracture with routine healing: Secondary | ICD-10-CM | POA: Diagnosis not present

## 2021-02-02 DIAGNOSIS — J690 Pneumonitis due to inhalation of food and vomit: Secondary | ICD-10-CM | POA: Diagnosis not present

## 2021-02-02 DIAGNOSIS — S72002A Fracture of unspecified part of neck of left femur, initial encounter for closed fracture: Secondary | ICD-10-CM | POA: Diagnosis not present

## 2021-02-02 LAB — BASIC METABOLIC PANEL
Anion gap: 8 (ref 5–15)
BUN: 18 mg/dL (ref 8–23)
CO2: 25 mmol/L (ref 22–32)
Calcium: 8.4 mg/dL — ABNORMAL LOW (ref 8.9–10.3)
Chloride: 107 mmol/L (ref 98–111)
Creatinine, Ser: 0.63 mg/dL (ref 0.61–1.24)
GFR, Estimated: >60 mL/min (ref >60)
Glucose, Bld: 100 mg/dL — ABNORMAL HIGH (ref 70–99)
Potassium: 4.4 mmol/L (ref 3.5–5.1)
Sodium: 140 mmol/L (ref 135–145)

## 2021-02-02 LAB — CBC
HCT: 22.9 % — ABNORMAL LOW (ref 39.0–52.0)
Hemoglobin: 7.5 g/dL — ABNORMAL LOW (ref 13.0–17.0)
MCH: 31.6 pg (ref 26.0–34.0)
MCHC: 32.8 g/dL (ref 30.0–36.0)
MCV: 96.6 fL (ref 80.0–100.0)
Platelets: 227 10*3/uL (ref 150–400)
RBC: 2.37 MIL/uL — ABNORMAL LOW (ref 4.22–5.81)
RDW: 16.6 % — ABNORMAL HIGH (ref 11.5–15.5)
WBC: 11.2 10*3/uL — ABNORMAL HIGH (ref 4.0–10.5)
nRBC: 0 % (ref 0.0–0.2)

## 2021-02-02 MED ORDER — IPRATROPIUM-ALBUTEROL 0.5-2.5 (3) MG/3ML IN SOLN: 3.0000 mL | Freq: Four times a day (QID) | RESPIRATORY_TRACT | Status: DC | PRN

## 2021-02-02 MED ORDER — SODIUM CHLORIDE 0.9 % IV SOLN
1.5000 g | Freq: Four times a day (QID) | INTRAVENOUS | Status: DC
  Administered 2021-02-02 – 2021-02-04 (×9): 1.5 g via INTRAVENOUS
  Filled 2021-02-02: qty 1.5
  Filled 2021-02-02 (×4): qty 4
  Filled 2021-02-02: qty 1.5
  Filled 2021-02-02: qty 4
  Filled 2021-02-02: qty 1.5
  Filled 2021-02-02 (×2): qty 4
  Filled 2021-02-02: qty 1.5
  Filled 2021-02-02 (×2): qty 4

## 2021-02-02 NOTE — Progress Notes (Signed)
Physical Therapy Treatment Patient Details Name: [name] MRN: [id number] DOB: Mar 28, 1934 Today's Date: 02/02/2021    History of Present Illness [name] is an 57yoM who comes to [facility] on 5/26 after a fall and subsequent hip pain. Imaging revealing fractures of Left wrist and left hip, as well as Rt inferior pubic ramus. Wrist fracute is directly adjacent to prior ORIF volar plating. Lumbar films show acute on subacute L2 compression fracture with 46mm bony retropulsion with "moderate spinal stenosis now seen at this level". PMH: compression fracture and vertebroplasty at T12, L3, L4, L5, PAF on eliquis, COPD, HTN, CAD s/p CABG, OSA on CPAP, Parkinsonism, lewy body dementia, AAA s/p endoprosthesis. After eliquis washout, pt went to OR on 5/31 for Left hip ORIF IM nailing, Left distal radius ORIF. Pt is WBAT at LLE and NWB LUE. Presurgical Hb: 11.3 down to 8.0 on POD1.    PT Comments    Pt received in bed, with pt maintaining eyes closed throughout majority of session but will verbally communicate with PT. Pt only oriented to self, with PT attempting to passively orient pt during session. PT encourages pt to come to sitting at EOB with PT initiating moving BLE to EOB but pt grimacing in pain & asking to stop. PT attempts to engage pt in LLE heel slides then PROM but pt with behaviors demonstrating ongoing pain. Pt requires +2 assist to reposition in bed. Pt left with bed alarm set, all needs in reach.   Follow Up Recommendations  SNF     Equipment Recommendations  None recommended by PT    Recommendations for Other Services       Precautions / Restrictions Precautions Precautions: Fall Restrictions Weight Bearing Restrictions: Yes LUE Weight Bearing: Non weight bearing (NWB L wrist, can use platform RW) RLE Weight Bearing: Weight bearing as tolerated LLE Weight Bearing: Weight bearing as tolerated    Mobility  Bed Mobility               General bed mobility  comments: +2 to scoot to [facility]    Transfers                    Ambulation/Gait                 Stairs             Wheelchair Mobility    Modified Rankin (Stroke Patients Only)       Balance                                            Cognition Arousal/Alertness: Lethargic Behavior During Therapy: Flat affect Overall Cognitive Status: History of cognitive impairments - at baseline                                 General Comments: lethargic, resistive to movement, minimal eye opening during session, poor ability to follow commands      Exercises      General Comments        Pertinent Vitals/Pain Pain Assessment: Faces Faces Pain Scale: Hurts whole lot Pain Location: BLE Pain Descriptors / Indicators: Grimacing;Guarding Pain Intervention(s): Repositioned;Monitored during session    Home Living  Prior Function            PT Goals (current goals can now be found in the care plan section) Acute Rehab PT Goals Patient Stated Goal: return to baseline mobility PT Goal Formulation: With patient/family Time For Goal Achievement: 02/13/21 Potential to Achieve Goals: Fair Progress towards PT goals: PT to reassess next treatment    Frequency    7X/week      PT Plan Current plan remains appropriate    Co-evaluation              AM-PAC PT "6 Clicks" Mobility    Outcome Measure  Help needed turning from your back to your side while in a flat bed without using bedrails?: A Lot Help needed moving from lying on your back to sitting on the side of a flat bed without using bedrails?: Total Help needed moving to and from a bed to a chair (including a wheelchair)?: Total Help needed standing up from a chair using your arms (e.g., wheelchair or bedside chair)?: Total Help needed to walk in hospital room?: Total Help needed climbing 3-5 steps with a railing? : Total 6 Click  Score: 7    End of Session Equipment Utilized During Treatment: Oxygen Activity Tolerance: Patient limited by pain Patient left: in bed;with call bell/phone within reach;with bed alarm set;with SCD's reapplied (1 mitten donned on RUE as other was found broken at beginning of session) Nurse Communication: Mobility status PT Visit Diagnosis: Unsteadiness on feet (R26.81);Difficulty in walking, not elsewhere classified (R26.2);Muscle weakness (generalized) (M62.81);Other abnormalities of gait and mobility (R26.89)     Time: 9030-0923 PT Time Calculation (min) (ACUTE ONLY): 11 min  Charges:  $Therapeutic Activity: 8-22 mins                     Lavone Nian, PT, DPT 02/02/21, 11:18 AM    Waunita Schooner 02/02/2021, 11:18 AM

## 2021-02-02 NOTE — Progress Notes (Signed)
Subjective: 4 Days Post-Op Procedure(s) (LRB): INTRAMEDULLARY (IM) NAIL INTERTROCHANTRIC (Left) OPEN REDUCTION INTERNAL FIXATION (ORIF) DISTAL RADIAL FRACTURE (Left) IRRIGATION AND DEBRIDEMENT Left Wrist and Removal of Plate (Left) Patient is trying to sleep this afternoon.  He is arousable.  He does not complain of significant pain.  Neurovascular status is good and left hand and the left lower leg.  Dressings are dry.  Hemoglobin is stable at 7.5.  Patient reports pain as mild.  Objective:   VITALS:   Vitals:   02/02/21 0818 02/02/21 1200  BP: 129/82 120/72  Pulse: 88 (!) 111  Resp: 16 16  Temp: 98.2 F (36.8 C) 98.1 F (36.7 C)  SpO2: (!) 88%     Neurologically intact Sensation intact distally Incision: dressing C/D/I  LABS Recent Labs    01/31/21 0222 02/01/21 0540 02/02/21 0553  HGB 7.4* 7.5* 7.5*  HCT 22.6* 23.3* 22.9*  WBC 12.0* 10.3 11.2*  PLT 179 207 227    Recent Labs    01/31/21 0222 02/01/21 0753 02/02/21 0553  NA 139 138 140  K 4.1 4.1 4.4  BUN 31* 24* 18  CREATININE 0.77 0.78 0.63  GLUCOSE 112* 102* 100*    No results for input(s): LABPT, INR in the last 72 hours.   Assessment/Plan: 4 Days Post-Op Procedure(s) (LRB): INTRAMEDULLARY (IM) NAIL INTERTROCHANTRIC (Left) OPEN REDUCTION INTERNAL FIXATION (ORIF) DISTAL RADIAL FRACTURE (Left) IRRIGATION AND DEBRIDEMENT Left Wrist and Removal of Plate (Left)   Up with therapy Discharge to SNF  Follow-up with Dr. Mack Guise in 2 weeks.

## 2021-02-02 NOTE — Progress Notes (Signed)
PROGRESS NOTE    [name]  OTL:[id number] DOB: 1934/09/01 DOA: 01/24/2021 PCP: [name], MD  Assessment & Plan:   Principal Problem:   Closed left hip fracture, initial encounter [facility]) Active Problems:   Hx of CABG   Essential hypertension   OSA on CPAP   PAF (paroxysmal atrial fibrillation) (HCC) CHA2DS2-VASc = 4. AC = Eliquis   COPD (chronic obstructive pulmonary disease) (HCC)   Accidental fall   Closed fracture of left distal radius   Dementia without behavioral disturbance (HCC)   Pubic ramus fracture, left, closed, initial encounter ([place])   Hip fracture ([place])   Protein-calorie malnutrition, severe   Left intertrochanteric hip fracture, left radius fracture, fracture of right inferior pubic ramus, acute on subacute L2 compression fracture: S/p intramedullary nail intertrochanteric left hip fracture and open reduction internal fixation of distal radius fracture.  Norco, fentanyl prn for pain. PT/OT recs SNF   Likely aspiration pneumonia: started on IV unasyn & bronchodilators. COVID19 neg. Encourage incentive spirometry. Speech consulted   PAF: continue on metoprolol, eliquis   ACD: H&H are stable. Will transfuse if Hb <7.0  COPD: w/o exacerbation. Continue on bronchodilators and encourage incentive spirometry   OSA: previously used CPAP qhs but pt kept trying to get up independently in the middle of the night. Continue w/ oxygen qhs prn   Lewy body dementia: continue on home dose of donepezil   Severe protein calorie malnutrition: continue w/ nutritional supplements    DVT prophylaxis: eliquis  Code Status: full Family Communication: discussed pt's care w/ pt's son at bedside and answered his questions  Disposition Plan: likely d/c to SNF  Level of care: Med-Surg   Status is: Inpatient  Remains inpatient appropriate because:Unsafe d/c plan, IV treatments appropriate due to intensity of illness or inability to take PO and Inpatient level of  care appropriate due to severity of illness, fever overnight, likely new onset pneumonia. Will need to be fever free for 24 hours    Dispo: The patient is from: Home              Anticipated d/c is to: SNF              Patient currently is not currently medically stable for d/c    Difficult to place patient : unclear    Consultants:     Procedures:    Antimicrobials:    Subjective: Pt is very lethargic and confused.   Objective: Vitals:   02/01/21 2101 02/01/21 2124 02/01/21 2318 02/02/21 0559  BP: 108/66 128/71 106/64 (!) 103/93  Pulse: 93 (!) 101 86 91  Resp:  18 17 19   Temp: 97.9 F (36.6 C) 100.2 F (37.9 C) 98.5 F (36.9 C) 97.7 F (36.5 C)  TempSrc: Oral Oral    SpO2: (!) 87% 94% 94% 93%  Weight:      Height:        Intake/Output Summary (Last 24 hours) at 02/02/2021 0734 Last data filed at 02/02/2021 0606 Gross per 24 hour  Intake 0 ml  Output 700 ml  Net -700 ml   Filed Weights   01/24/21 2028 01/25/21 1429 01/29/21 0720  Weight: 68.9 kg 66.2 kg 66.2 kg    Examination:  General exam: Appears lethargic   Respiratory system: diminished breath sounds b/l. No wheezes, rales  Cardiovascular system: S1/S2+. No clicks or rubs   Gastrointestinal system: Abd is soft, NT, ND & hypoactive bowel sounds  Central nervous system: Lethargic. Moves all extremities  Psychiatry: judgement and insight appear abnormal. Flat mood and affect    Data Reviewed: I have personally reviewed following labs and imaging studies  CBC: Recent Labs  Lab 01/29/21 0524 01/30/21 0423 01/31/21 0222 02/01/21 0540 02/02/21 0553  WBC 10.4 9.7 12.0* 10.3 11.2*  HGB 8.4* 8.0* 7.4* 7.5* 7.5*  HCT 24.9* 24.0* 22.6* 23.3* 22.9*  MCV 94.7 94.1 96.2 96.7 96.6  PLT 150 152 179 207 016   Basic Metabolic Panel: Recent Labs  Lab 01/30/21 0423 01/31/21 0222 02/01/21 0753 02/02/21 0553  NA 139 139 138 140  K 4.3 4.1 4.1 4.4  CL 109 106 107 107  CO2 21* 26 23 25   GLUCOSE 120*  112* 102* 100*  BUN 26* 31* 24* 18  CREATININE 0.67 0.77 0.78 0.63  CALCIUM 8.5* 8.4* 8.3* 8.4*   GFR: Estimated Creatinine Clearance: 63.2 mL/min (by C-G formula based on SCr of 0.63 mg/dL). Liver Function Tests: No results for input(s): AST, ALT, ALKPHOS, BILITOT, PROT, ALBUMIN in the last 168 hours. No results for input(s): LIPASE, AMYLASE in the last 168 hours. No results for input(s): AMMONIA in the last 168 hours. Coagulation Profile: No results for input(s): INR, PROTIME in the last 168 hours. Cardiac Enzymes: No results for input(s): CKTOTAL, CKMB, CKMBINDEX, TROPONINI in the last 168 hours. BNP (last 3 results) No results for input(s): PROBNP in the last 8760 hours. HbA1C: No results for input(s): HGBA1C in the last 72 hours. CBG: No results for input(s): GLUCAP in the last 168 hours. Lipid Profile: No results for input(s): CHOL, HDL, LDLCALC, TRIG, CHOLHDL, LDLDIRECT in the last 72 hours. Thyroid Function Tests: No results for input(s): TSH, T4TOTAL, FREET4, T3FREE, THYROIDAB in the last 72 hours. Anemia Panel: No results for input(s): VITAMINB12, FOLATE, FERRITIN, TIBC, IRON, RETICCTPCT in the last 72 hours. Sepsis Labs: No results for input(s): PROCALCITON, LATICACIDVEN in the last 168 hours.  Recent Results (from the past 240 hour(s))  Resp Panel by RT-PCR (Flu A&B, Covid) Nasopharyngeal Swab     Status: None   Collection Time: 01/24/21  1:43 AM   Specimen: Nasopharyngeal Swab; Nasopharyngeal(NP) swabs in vial transport medium  Result Value Ref Range Status   SARS Coronavirus 2 by RT PCR NEGATIVE NEGATIVE Final    Comment: (NOTE) SARS-CoV-2 target nucleic acids are NOT DETECTED.  The SARS-CoV-2 RNA is generally detectable in upper respiratory specimens during the acute phase of infection. The lowest concentration of SARS-CoV-2 viral copies this assay can detect is 138 copies/mL. A negative result does not preclude SARS-Cov-2 infection and should not be used as  the sole basis for treatment or other patient management decisions. A negative result may occur with  improper specimen collection/handling, submission of specimen other than nasopharyngeal swab, presence of viral mutation(s) within the areas targeted by this assay, and inadequate number of viral copies(<138 copies/mL). A negative result must be combined with clinical observations, patient history, and epidemiological information. The expected result is Negative.  Fact Sheet for Patients:  EntrepreneurPulse.com.au  Fact Sheet for Healthcare Providers:  IncredibleEmployment.be  This test is no t yet approved or cleared by the [place] FDA and  has been authorized for detection and/or diagnosis of SARS-CoV-2 by FDA under an Emergency Use Authorization (EUA). This EUA will remain  in effect (meaning this test can be used) for the duration of the COVID-19 declaration under Section 564(b)(1) of the Act, 21 U.S.C.section 360bbb-3(b)(1), unless the authorization is terminated  or revoked sooner.  Influenza A by PCR NEGATIVE NEGATIVE Final   Influenza B by PCR NEGATIVE NEGATIVE Final    Comment: (NOTE) The Xpert Xpress SARS-CoV-2/FLU/RSV plus assay is intended as an aid in the diagnosis of influenza from Nasopharyngeal swab specimens and should not be used as a sole basis for treatment. Nasal washings and aspirates are unacceptable for Xpert Xpress SARS-CoV-2/FLU/RSV testing.  Fact Sheet for Patients: EntrepreneurPulse.com.au  Fact Sheet for Healthcare Providers: IncredibleEmployment.be  This test is not yet approved or cleared by the [place] FDA and has been authorized for detection and/or diagnosis of SARS-CoV-2 by FDA under an Emergency Use Authorization (EUA). This EUA will remain in effect (meaning this test can be used) for the duration of the COVID-19 declaration under Section 564(b)(1) of  the Act, 21 U.S.C. section 360bbb-3(b)(1), unless the authorization is terminated or revoked.  Performed at [facility], [address] [place], [place] [address]   MRSA PCR Screening     Status: None   Collection Time: 01/28/21 10:51 AM   Specimen: Nasopharyngeal  Result Value Ref Range Status   MRSA by PCR NEGATIVE NEGATIVE Final    Comment:        The GeneXpert MRSA Assay (FDA approved for NASAL specimens only), is one component of a comprehensive MRSA colonization surveillance program. It is not intended to diagnose MRSA infection nor to guide or monitor treatment for MRSA infections. Performed at [facility], [place], [place] [address]   SARS CORONAVIRUS 2 (TAT 6-24 HRS) Nasopharyngeal Nasopharyngeal Swab     Status: None   Collection Time: 02/01/21 12:35 PM   Specimen: Nasopharyngeal Swab  Result Value Ref Range Status   SARS Coronavirus 2 NEGATIVE NEGATIVE Final    Comment: (NOTE) SARS-CoV-2 target nucleic acids are NOT DETECTED.  The SARS-CoV-2 RNA is generally detectable in upper and lower respiratory specimens during the acute phase of infection. Negative results do not preclude SARS-CoV-2 infection, do not rule out co-infections with other pathogens, and should not be used as the sole basis for treatment or other patient management decisions. Negative results must be combined with clinical observations, patient history, and epidemiological information. The expected result is Negative.  Fact Sheet for Patients: SugarRoll.be  Fact Sheet for Healthcare Providers: https://www.woods-mathews.com/  This test is not yet approved or cleared by the [place] FDA and  has been authorized for detection and/or diagnosis of SARS-CoV-2 by FDA under an Emergency Use Authorization (EUA). This EUA will remain  in effect (meaning this test can be used) for the duration of the COVID-19  declaration under Se ction 564(b)(1) of the Act, 21 U.S.C. section 360bbb-3(b)(1), unless the authorization is terminated or revoked sooner.  Performed at [facility], [place] [address] [place], [place] [address]          Radiology Studies: No results found.      Scheduled Meds: . acetaminophen  650 mg Oral Once  . apixaban  5 mg Oral BID  . bacitracin  1 application Topical QHS  . cholecalciferol  2,000 Units Oral Daily  . docusate sodium  100 mg Oral BID  . donepezil  10 mg Oral QHS  . ezetimibe  10 mg Oral Daily  . feeding supplement  1 Container Oral TID BM  . finasteride  5 mg Oral Daily  . fluticasone  2 spray Each Nare Daily  . lisinopril  2.5 mg Oral BID  . magnesium oxide  400 mg Oral QPM  .  metoprolol tartrate  25 mg Oral BID  . mirabegron ER  50 mg Oral Daily  . mirtazapine  7.5 mg Oral QHS  . multivitamin with minerals  1 tablet Oral Daily  . polyvinyl alcohol  1 drop Both Eyes QHS  . rosuvastatin  20 mg Oral Daily  . senna  1 tablet Oral BID  . traZODone  50 mg Oral QHS   Continuous Infusions: . methocarbamol (ROBAXIN) IV       LOS: 9 days    Time spent: 30 mins     Wyvonnia Dusky, MD [facility] Pager [phone]  If 7PM-7AM, please contact night-coverage 02/02/2021, 7:34 AM

## 2021-02-03 DIAGNOSIS — J690 Pneumonitis due to inhalation of food and vomit: Secondary | ICD-10-CM | POA: Diagnosis not present

## 2021-02-03 DIAGNOSIS — F0280 Dementia in other diseases classified elsewhere without behavioral disturbance: Secondary | ICD-10-CM | POA: Diagnosis not present

## 2021-02-03 DIAGNOSIS — S72002A Fracture of unspecified part of neck of left femur, initial encounter for closed fracture: Secondary | ICD-10-CM | POA: Diagnosis not present

## 2021-02-03 DIAGNOSIS — G3183 Dementia with Lewy bodies: Secondary | ICD-10-CM | POA: Diagnosis not present

## 2021-02-03 LAB — BASIC METABOLIC PANEL
Anion gap: 5 (ref 5–15)
BUN: 15 mg/dL (ref 8–23)
CO2: 26 mmol/L (ref 22–32)
Calcium: 8.3 mg/dL — ABNORMAL LOW (ref 8.9–10.3)
Chloride: 108 mmol/L (ref 98–111)
Creatinine, Ser: 0.71 mg/dL (ref 0.61–1.24)
GFR, Estimated: >60 mL/min (ref >60)
Glucose, Bld: 90 mg/dL (ref 70–99)
Potassium: 4 mmol/L (ref 3.5–5.1)
Sodium: 139 mmol/L (ref 135–145)

## 2021-02-03 LAB — CBC
HCT: 24.6 % — ABNORMAL LOW (ref 39.0–52.0)
Hemoglobin: 7.9 g/dL — ABNORMAL LOW (ref 13.0–17.0)
MCH: 31.2 pg (ref 26.0–34.0)
MCHC: 32.1 g/dL (ref 30.0–36.0)
MCV: 97.2 fL (ref 80.0–100.0)
Platelets: 259 10*3/uL (ref 150–400)
RBC: 2.53 MIL/uL — ABNORMAL LOW (ref 4.22–5.81)
RDW: 16.6 % — ABNORMAL HIGH (ref 11.5–15.5)
WBC: 9.3 10*3/uL (ref 4.0–10.5)
nRBC: 0 % (ref 0.0–0.2)

## 2021-02-03 MED ORDER — SODIUM CHLORIDE 0.9 % IV BOLUS
1000.0000 mL | Freq: Once | INTRAVENOUS | Status: AC
  Administered 2021-02-03: 1000 mL via INTRAVENOUS

## 2021-02-03 MED ORDER — SODIUM CHLORIDE 0.9 % IV SOLN
INTRAVENOUS | Status: DC | PRN
  Administered 2021-02-03: 500 mL via INTRAVENOUS

## 2021-02-03 NOTE — Progress Notes (Signed)
PROGRESS NOTE    [name]  XKP:[id number] DOB: [birth year] DOA: 01/24/2021 PCP: [name], MD  Assessment & Plan:   Principal Problem:   Closed left hip fracture, initial encounter [facility]) Active Problems:   Hx of CABG   Essential hypertension   OSA on CPAP   PAF (paroxysmal atrial fibrillation) (HCC) CHA2DS2-VASc = 4. AC = Eliquis   COPD (chronic obstructive pulmonary disease) (HCC)   Accidental fall   Closed fracture of left distal radius   Dementia without behavioral disturbance (HCC)   Pubic ramus fracture, left, closed, initial encounter ([place])   Hip fracture ([place])   Protein-calorie malnutrition, severe   Left intertrochanteric hip fracture, left radius fracture, fracture of right inferior pubic ramus, acute on subacute L2 compression fracture: S/p intramedullary nail intertrochanteric left hip fracture and open reduction internal fixation of distal radius fracture.  Norco, fentanyl prn for pain. PT/OT recs SNF   Likely aspiration pneumonia: continue on IV unasyn & bronchodilators. COVID19 neg. Encourage incentive spirometry. Speech consulted  PAF: continue on eliquis, metoprolol   ACD: H&H are trending up. No need for a transfusion currently   COPD: w/o exacerbation. Encourage incentive spirometry. Continue on bronchodilators   OSA: previously used CPAP qhs but pt kept trying to get up independently in the middle of the night. Continue w/ oxygen qhs prn   Lewy body dementia: continue on home dose of donepezil   Severe protein calorie malnutrition: continue w/ nutritional supplements    DVT prophylaxis: eliquis  Code Status: full Family Communication: discussed pt's care w/ pt's son at bedside and answered his questions  Disposition Plan: likely d/c to SNF  Level of care: Med-Surg   Status is: Inpatient  Remains inpatient appropriate because:Unsafe d/c plan, IV treatments appropriate due to intensity of illness or inability to take PO and  Inpatient level of care appropriate due to severity of illness, no fever overnight but speech therapy still needs to see the pt prior to d/c    Dispo: The patient is from: Home              Anticipated d/c is to: SNF              Patient currently is not currently medically stable for d/c    Difficult to place patient : unclear    Consultants:     Procedures:    Antimicrobials:    Subjective: Pt is very sleepy   Objective: Vitals:   02/02/21 1200 02/02/21 1614 02/02/21 2019 02/03/21 0318  BP: 120/72 124/76 120/83 92/66  Pulse: (!) 111 86 98 97  Resp: 16 16 17 17   Temp: 98.1 F (36.7 C) 97.9 F (36.6 C) 98.1 F (36.7 C) 98.8 F (37.1 C)  TempSrc:      SpO2:  98% 93% 100%  Weight:      Height:        Intake/Output Summary (Last 24 hours) at 02/03/2021 0721 Last data filed at 02/03/2021 0531 Gross per 24 hour  Intake 325.98 ml  Output 1250 ml  Net -924.02 ml   Filed Weights   01/24/21 2028 01/25/21 1429 01/29/21 0720  Weight: 68.9 kg 66.2 kg 66.2 kg    Examination:  General exam: Appears sleepy  Respiratory system: decreased breath sounds b/l   Cardiovascular system: S1 & S2+. No rubs or clicks  Gastrointestinal system: Abd is soft, NT, ND & hypoactive bowel sounds  Central nervous system: moves all extremities  Psychiatry: judgement and insight appear  normal. Flat mood and affect    Data Reviewed: I have personally reviewed following labs and imaging studies  CBC: Recent Labs  Lab 01/30/21 0423 01/31/21 0222 02/01/21 0540 02/02/21 0553 02/03/21 0542  WBC 9.7 12.0* 10.3 11.2* 9.3  HGB 8.0* 7.4* 7.5* 7.5* 7.9*  HCT 24.0* 22.6* 23.3* 22.9* 24.6*  MCV 94.1 96.2 96.7 96.6 97.2  PLT 152 179 207 227 409   Basic Metabolic Panel: Recent Labs  Lab 01/30/21 0423 01/31/21 0222 02/01/21 0753 02/02/21 0553 02/03/21 0542  NA 139 139 138 140 139  K 4.3 4.1 4.1 4.4 4.0  CL 109 106 107 107 108  CO2 21* 26 23 25 26   GLUCOSE 120* 112* 102* 100* 90  BUN  26* 31* 24* 18 15  CREATININE 0.67 0.77 0.78 0.63 0.71  CALCIUM 8.5* 8.4* 8.3* 8.4* 8.3*   GFR: Estimated Creatinine Clearance: 63.2 mL/min (by C-G formula based on SCr of 0.71 mg/dL). Liver Function Tests: No results for input(s): AST, ALT, ALKPHOS, BILITOT, PROT, ALBUMIN in the last 168 hours. No results for input(s): LIPASE, AMYLASE in the last 168 hours. No results for input(s): AMMONIA in the last 168 hours. Coagulation Profile: No results for input(s): INR, PROTIME in the last 168 hours. Cardiac Enzymes: No results for input(s): CKTOTAL, CKMB, CKMBINDEX, TROPONINI in the last 168 hours. BNP (last 3 results) No results for input(s): PROBNP in the last 8760 hours. HbA1C: No results for input(s): HGBA1C in the last 72 hours. CBG: No results for input(s): GLUCAP in the last 168 hours. Lipid Profile: No results for input(s): CHOL, HDL, LDLCALC, TRIG, CHOLHDL, LDLDIRECT in the last 72 hours. Thyroid Function Tests: No results for input(s): TSH, T4TOTAL, FREET4, T3FREE, THYROIDAB in the last 72 hours. Anemia Panel: No results for input(s): VITAMINB12, FOLATE, FERRITIN, TIBC, IRON, RETICCTPCT in the last 72 hours. Sepsis Labs: No results for input(s): PROCALCITON, LATICACIDVEN in the last 168 hours.  Recent Results (from the past 240 hour(s))  MRSA PCR Screening     Status: None   Collection Time: 01/28/21 10:51 AM   Specimen: Nasopharyngeal  Result Value Ref Range Status   MRSA by PCR NEGATIVE NEGATIVE Final    Comment:        The GeneXpert MRSA Assay (FDA approved for NASAL specimens only), is one component of a comprehensive MRSA colonization surveillance program. It is not intended to diagnose MRSA infection nor to guide or monitor treatment for MRSA infections. Performed at [facility], [place], [place] [address]   SARS CORONAVIRUS 2 (TAT 6-24 HRS) Nasopharyngeal Nasopharyngeal Swab     Status: None   Collection Time: 02/01/21 12:35 PM    Specimen: Nasopharyngeal Swab  Result Value Ref Range Status   SARS Coronavirus 2 NEGATIVE NEGATIVE Final    Comment: (NOTE) SARS-CoV-2 target nucleic acids are NOT DETECTED.  The SARS-CoV-2 RNA is generally detectable in upper and lower respiratory specimens during the acute phase of infection. Negative results do not preclude SARS-CoV-2 infection, do not rule out co-infections with other pathogens, and should not be used as the sole basis for treatment or other patient management decisions. Negative results must be combined with clinical observations, patient history, and epidemiological information. The expected result is Negative.  Fact Sheet for Patients: SugarRoll.be  Fact Sheet for Healthcare Providers: https://www.woods-mathews.com/  This test is not yet approved or cleared by the [place] FDA and  has been authorized for detection and/or diagnosis of SARS-CoV-2 by FDA under an Emergency  Use Authorization (EUA). This EUA will remain  in effect (meaning this test can be used) for the duration of the COVID-19 declaration under Se ction 564(b)(1) of the Act, 21 U.S.C. section 360bbb-3(b)(1), unless the authorization is terminated or revoked sooner.  Performed at [facility], [place] [address] [place], [place] [address]   CULTURE, BLOOD (ROUTINE X 2) w Reflex to ID Panel     Status: None (Preliminary result)   Collection Time: 02/02/21  8:02 AM   Specimen: BLOOD  Result Value Ref Range Status   Specimen Description BLOOD LEFT ANTECUBITAL  Final   Special Requests   Final    BOTTLES DRAWN AEROBIC AND ANAEROBIC Blood Culture adequate volume   Culture   Final    NO GROWTH < 12 HOURS Performed at [facility], [address], [place], [place] [address]    Report Status PENDING  Incomplete  CULTURE, BLOOD (ROUTINE X 2) w Reflex to ID Panel     Status: None (Preliminary result)   Collection Time: 02/02/21  8:03 AM    Specimen: BLOOD  Result Value Ref Range Status   Specimen Description BLOOD BLOOD RIGHT HAND  Final   Special Requests   Final    BOTTLES DRAWN AEROBIC AND ANAEROBIC Blood Culture adequate volume   Culture   Final    NO GROWTH < 12 HOURS Performed at [facility], [address], [place], [place] [address]    Report Status PENDING  Incomplete         Radiology Studies: DG Chest Port 1 View  Result Date: 02/02/2021 CLINICAL DATA:  Cough and fever EXAM: PORTABLE CHEST 1 VIEW COMPARISON:  Jan 24, 2021 FINDINGS: Calcified granuloma noted in right upper lobe. Ill-defined airspace opacity noted in the right lower lung region. Lungs elsewhere are clear. Heart size and pulmonary vascular normal. Status post coronary artery bypass grafting. Status post kyphoplasty procedure at T12. IMPRESSION: Ill-defined opacity right lower lobe concerning for developing pneumonia. Note that atypical pneumonia could present in this manner. Check of COVID-19 status may be warranted given this appearance. Lungs elsewhere clear. Except for calcified granuloma right upper lobe. Heart size normal. Status post coronary artery bypass grafting. Electronically Signed   By: Lowella Grip III M.D.   On: 02/02/2021 09:28        Scheduled Meds: . acetaminophen  650 mg Oral Once  . apixaban  5 mg Oral BID  . bacitracin  1 application Topical QHS  . cholecalciferol  2,000 Units Oral Daily  . docusate sodium  100 mg Oral BID  . donepezil  10 mg Oral QHS  . ezetimibe  10 mg Oral Daily  . feeding supplement  1 Container Oral TID BM  . finasteride  5 mg Oral Daily  . fluticasone  2 spray Each Nare Daily  . lisinopril  2.5 mg Oral BID  . magnesium oxide  400 mg Oral QPM  . metoprolol tartrate  25 mg Oral BID  . mirabegron ER  50 mg Oral Daily  . mirtazapine  7.5 mg Oral QHS  . multivitamin with minerals  1 tablet Oral Daily  . polyvinyl alcohol  1 drop Both Eyes QHS  . rosuvastatin  20 mg Oral Daily   . senna  1 tablet Oral BID  . traZODone  50 mg Oral QHS   Continuous Infusions: . sodium chloride 10 mL/hr at 02/03/21 0717  . ampicillin-sulbactam (UNASYN) IV 1.5 g (02/03/21 0720)  . methocarbamol (ROBAXIN)  IV       LOS: 10 days    Time spent: 33 mins     Wyvonnia Dusky, MD [facility] Pager [phone]  If 7PM-7AM, please contact night-coverage 02/03/2021, 7:21 AM

## 2021-02-03 NOTE — Progress Notes (Signed)
Subjective: 5 Days Post-Op Procedure(s) (LRB): INTRAMEDULLARY (IM) NAIL INTERTROCHANTRIC (Left) OPEN REDUCTION INTERNAL FIXATION (ORIF) DISTAL RADIAL FRACTURE (Left) IRRIGATION AND DEBRIDEMENT Left Wrist and Removal of Plate (Left) Patient is alert and talkative today.  Not having significant pain.  He awaits skilled nursing transfer.  Dressings are dry.  Patient reports pain as mild.  Objective:   VITALS:   Vitals:   02/03/21 1520 02/03/21 1711  BP: (!) 79/51 (!) 105/59  Pulse: 79 92  Resp: 16   Temp: 98.3 F (36.8 C)   SpO2:      Neurologically intact Incision: dressing C/D/I  LABS Recent Labs    02/01/21 0540 02/02/21 0553 02/03/21 0542  HGB 7.5* 7.5* 7.9*  HCT 23.3* 22.9* 24.6*  WBC 10.3 11.2* 9.3  PLT 207 227 259    Recent Labs    02/01/21 0753 02/02/21 0553 02/03/21 0542  NA 138 140 139  K 4.1 4.4 4.0  BUN 24* 18 15  CREATININE 0.78 0.63 0.71  GLUCOSE 102* 100* 90    No results for input(s): LABPT, INR in the last 72 hours.   Assessment/Plan: 5 Days Post-Op Procedure(s) (LRB): INTRAMEDULLARY (IM) NAIL INTERTROCHANTRIC (Left) OPEN REDUCTION INTERNAL FIXATION (ORIF) DISTAL RADIAL FRACTURE (Left) IRRIGATION AND DEBRIDEMENT Left Wrist and Removal of Plate (Left)   Up with therapy Discharge to SNF

## 2021-02-03 NOTE — Progress Notes (Signed)
Physical Therapy Treatment Patient Details Name: CANON GOLA MRN: [id number] DOB: [birth year] Today's Date: 02/03/2021    History of Present Illness Frederich Montilla is an 34yoM who comes to [facility] on 5/26 after a fall and subsequent hip pain. Imaging revealing fractures of Left wrist and left hip, as well as Rt inferior pubic ramus. Wrist fracute is directly adjacent to prior ORIF volar plating. Lumbar films show acute on subacute L2 compression fracture with 54mm bony retropulsion with "moderate spinal stenosis now seen at this level". PMH: compression fracture and vertebroplasty at T12, L3, L4, L5, PAF on eliquis, COPD, HTN, CAD s/p CABG, OSA on CPAP, Parkinsonism, lewy body dementia, AAA s/p endoprosthesis. After eliquis washout, pt went to OR on 5/31 for Left hip ORIF IM nailing, Left distal radius ORIF. Pt is WBAT at LLE and NWB LUE. Presurgical Hb: 11.3 down to 8.0 on POD1.    PT Comments    Sleepy this am but does awaken more during session.  Participated in exercises as described below.  To EOB with mod a x 1 and good effort.  He is able to sit x 10 minutes with min verbal and tactile cues.  R lean today due to pain in hip..  Nursing unable to give pain meds prior as he was too lethargic to safety give but he awoke during session and RN in to give them at end.  Back to bed with max a x 2 to reposition.  Pt puts good effort into session despite lethargy and pain.   Follow Up Recommendations  SNF     Equipment Recommendations  None recommended by PT    Recommendations for Other Services       Precautions / Restrictions Precautions Precautions: Fall Restrictions Weight Bearing Restrictions: Yes LUE Weight Bearing: Non weight bearing RLE Weight Bearing: Weight bearing as tolerated LLE Weight Bearing: Weight bearing as tolerated    Mobility  Bed Mobility Overal bed mobility: Needs Assistance   Rolling: Mod assist   Supine to sit: Mod assist Sit to supine: Mod assist;+2 for  physical assistance;Max assist        Transfers                 General transfer comment: deferred  Ambulation/Gait                 Stairs             Wheelchair Mobility    Modified Rankin (Stroke Patients Only)       Balance Overall balance assessment: Needs assistance Sitting-balance support: Feet supported Sitting balance-Leahy Scale: Poor Sitting balance - Comments: R lean today due to pain.  verba and tactile cues to remain sitting   Standing balance support: Bilateral upper extremity supported Standing balance-Leahy Scale: Zero                              Cognition Arousal/Alertness: Lethargic Behavior During Therapy: WFL for tasks assessed/performed Overall Cognitive Status: History of cognitive impairments - at baseline                                        Exercises Other Exercises Other Exercises: BLE AAROM x 10 prior to mobility    General Comments        Pertinent Vitals/Pain Pain Assessment: Faces Faces Pain Scale: Hurts even  more Pain Location: BLE Pain Descriptors / Indicators: Grimacing;Guarding Pain Intervention(s): RN gave pain meds during session    Home Living                      Prior Function            PT Goals (current goals can now be found in the care plan section) Progress towards PT goals: Progressing toward goals    Frequency    7X/week      PT Plan Current plan remains appropriate    Co-evaluation              AM-PAC PT "6 Clicks" Mobility   Outcome Measure  Help needed turning from your back to your side while in a flat bed without using bedrails?: A Lot Help needed moving from lying on your back to sitting on the side of a flat bed without using bedrails?: Total Help needed moving to and from a bed to a chair (including a wheelchair)?: Total Help needed standing up from a chair using your arms (e.g., wheelchair or bedside chair)?:  Total Help needed to walk in hospital room?: Total Help needed climbing 3-5 steps with a railing? : Total 6 Click Score: 7    End of Session Equipment Utilized During Treatment: Oxygen Activity Tolerance: Patient limited by pain Patient left: in bed;with call bell/phone within reach;with bed alarm set;with SCD's reapplied (1 mitten donned on RUE as other was found broken at beginning of session) Nurse Communication: Mobility status PT Visit Diagnosis: Unsteadiness on feet (R26.81);Difficulty in walking, not elsewhere classified (R26.2);Muscle weakness (generalized) (M62.81);Other abnormalities of gait and mobility (R26.89)     Time: 8786-7672 PT Time Calculation (min) (ACUTE ONLY): 16 min  Charges:  $Therapeutic Exercise: 8-22 mins                    [name], PTA 02/03/21, 11:04 AM

## 2021-02-04 LAB — BASIC METABOLIC PANEL
Anion gap: 7 (ref 5–15)
BUN: 16 mg/dL (ref 8–23)
CO2: 23 mmol/L (ref 22–32)
Calcium: 8.1 mg/dL — ABNORMAL LOW (ref 8.9–10.3)
Chloride: 109 mmol/L (ref 98–111)
Creatinine, Ser: 0.74 mg/dL (ref 0.61–1.24)
GFR, Estimated: >60 mL/min (ref >60)
Glucose, Bld: 94 mg/dL (ref 70–99)
Potassium: 3.7 mmol/L (ref 3.5–5.1)
Sodium: 139 mmol/L (ref 135–145)

## 2021-02-04 LAB — CBC
HCT: 23.8 % — ABNORMAL LOW (ref 39.0–52.0)
Hemoglobin: 7.9 g/dL — ABNORMAL LOW (ref 13.0–17.0)
MCH: 32.2 pg (ref 26.0–34.0)
MCHC: 33.2 g/dL (ref 30.0–36.0)
MCV: 97.1 fL (ref 80.0–100.0)
Platelets: 267 10*3/uL (ref 150–400)
RBC: 2.45 MIL/uL — ABNORMAL LOW (ref 4.22–5.81)
RDW: 16.9 % — ABNORMAL HIGH (ref 11.5–15.5)
WBC: 11.1 10*3/uL — ABNORMAL HIGH (ref 4.0–10.5)
nRBC: 0 % (ref 0.0–0.2)

## 2021-02-04 MED ORDER — AMOXICILLIN-POT CLAVULANATE 875-125 MG PO TABS: 1.0000 | ORAL_TABLET | Freq: Two times a day (BID) | ORAL | 0 refills | Status: AC

## 2021-02-04 NOTE — Progress Notes (Signed)
Patient discharged to SNF, EMS called and patient awaiting pick up as per outgoing RN.

## 2021-02-04 NOTE — TOC Progression Note (Signed)
Transition of Care [facility]) - Progression Note    Patient Details  Name: [name] MRN: [id number] Date of Birth: 1933-09-19  Transition of Care [facility]) CM/SW [place], RN Phone Number: 02/04/2021, 2:54 PM  Clinical Narrative:     Spoke with the son [name], Call EMS to transport to [facility] room 606 the nurse is to call report, DC summary in the chart       Expected Discharge Plan and Services           Expected Discharge Date: 02/04/21                                     Social Determinants of Health (SDOH) Interventions    Readmission Risk Interventions Readmission Risk Prevention Plan 12/28/2019 05/19/2019  Transportation Screening Complete Complete  HRI or [place] - Complete  Social Work Consult for Reserve Planning/Counseling - Complete  Palliative Care Screening - Not Applicable  Medication Review [employer] photographer) Complete Complete  HRI or Home Care Consult Complete -  [place] Complete -  Some recent data might be hidden

## 2021-02-04 NOTE — Progress Notes (Signed)
Physical Therapy Treatment Patient Details Name: [name] MRN: [id number] DOB: 1934-03-19 Today's Date: 02/04/2021    History of Present Illness Tra [name] is an 41yoM who comes to [facility] on 5/26 after a fall and subsequent hip pain. Imaging revealing fractures of Left wrist and left hip, as well as Rt inferior pubic ramus. Wrist fracute is directly adjacent to prior ORIF volar plating. Lumbar films show acute on subacute L2 compression fracture with 20mm bony retropulsion with "moderate spinal stenosis now seen at this level". PMH: compression fracture and vertebroplasty at T12, L3, L4, L5, PAF on eliquis, COPD, HTN, CAD s/p CABG, OSA on CPAP, Parkinsonism, lewy body dementia, AAA s/p endoprosthesis. After eliquis washout, pt went to OR on 5/31 for Left hip ORIF IM nailing, Left distal radius ORIF. Pt is WBAT at LLE and NWB LUE. Presurgical Hb: 11.3 down to 8.0 on POD1.    PT Comments    Participated in exercises as described below.  Pt ready to get up today. Min a x 1 to EOB.  Steady with sitting with supervision and cues.  Stood with Left platform walker and inc of BM noted.  After care by tech, he is able to transfer to bedside recliner with min a x 2.  Good stepping today and remained in recliner through lunch.   Follow Up Recommendations  SNF     Equipment Recommendations  None recommended by PT    Recommendations for Other Services       Precautions / Restrictions Precautions Precautions: Fall Restrictions Weight Bearing Restrictions: Yes LUE Weight Bearing: Non weight bearing RLE Weight Bearing: Weight bearing as tolerated LLE Weight Bearing: Weight bearing as tolerated    Mobility  Bed Mobility Overal bed mobility: Needs Assistance Bed Mobility: Supine to Sit     Supine to sit: Mod assist;Min assist     General bed mobility comments: good effort today    Transfers Overall transfer level: Needs assistance Equipment used: Left platform walker Transfers:  Sit to/from Stand Sit to Stand: Min assist;Mod assist;+2 physical assistance            Ambulation/Gait Ambulation/Gait assistance: Mod assist;Min assist Gait Distance (Feet): 3 Feet Assistive device: Left platform walker Gait Pattern/deviations: Step-to pattern;Trunk flexed Gait velocity: decreased   General Gait Details: able to step to chair today at bedside   Stairs             Wheelchair Mobility    Modified Rankin (Stroke Patients Only)       Balance Overall balance assessment: Needs assistance Sitting-balance support: Feet supported Sitting balance-Leahy Scale: Poor     Standing balance support: Bilateral upper extremity supported Standing balance-Leahy Scale: Poor Standing balance comment: able to stand well today with +2 assist                            Cognition Arousal/Alertness: Awake/alert Behavior During Therapy: WFL for tasks assessed/performed Overall Cognitive Status: History of cognitive impairments - at baseline                                        Exercises General Exercises - Lower Extremity Heel Slides: AAROM;Both;10 reps;Supine Hip ABduction/ADduction: AAROM;Both;10 reps;Supine    General Comments        Pertinent Vitals/Pain Pain Assessment: Faces Faces Pain Scale: Hurts little more Pain Location: BLE Pain Descriptors /  Indicators: Grimacing;Guarding Pain Intervention(s): Monitored during session;Repositioned    Home Living                      Prior Function            PT Goals (current goals can now be found in the care plan section) Progress towards PT goals: Progressing toward goals    Frequency    7X/week      PT Plan Current plan remains appropriate    Co-evaluation              AM-PAC PT "6 Clicks" Mobility   Outcome Measure  Help needed turning from your back to your side while in a flat bed without using bedrails?: A Lot Help needed moving from  lying on your back to sitting on the side of a flat bed without using bedrails?: A Lot Help needed moving to and from a bed to a chair (including a wheelchair)?: A Lot Help needed standing up from a chair using your arms (e.g., wheelchair or bedside chair)?: A Lot Help needed to walk in hospital room?: A Lot Help needed climbing 3-5 steps with a railing? : Total 6 Click Score: 11    End of Session Equipment Utilized During Treatment: Gait belt Activity Tolerance: Patient tolerated treatment well Patient left: in chair;with call bell/phone within reach;with chair alarm set;with family/visitor present Nurse Communication: Mobility status PT Visit Diagnosis: Unsteadiness on feet (R26.81);Difficulty in walking, not elsewhere classified (R26.2);Muscle weakness (generalized) (M62.81);Other abnormalities of gait and mobility (R26.89)     Time: 3212-2482 PT Time Calculation (min) (ACUTE ONLY): 19 min  Charges:  $Therapeutic Activity: 8-22 mins                    [name], PTA 02/04/21, 1:44 PM

## 2021-02-04 NOTE — Evaluation (Signed)
Clinical/Bedside Swallow Evaluation Patient Details  Name: [name] MRN: [id number] Date of Birth: [birth year]  Today's Date: 02/04/2021 Time: SLP Start Time (ACUTE ONLY): 1055 SLP Stop Time (ACUTE ONLY): 1155 SLP Time Calculation (min) (ACUTE ONLY): 60 min  Past Medical History:  Past Medical History:  Diagnosis Date  . AAA (abdominal aortic aneurysm) ([place]) 05/2019   Relativley Rapid progression from June-Aug 2020 (4.6 - 5.4 cm): s/p EVAR (Dr. Lucky Cowboy [facility]): EVAR - 23 mm prox, 12 cm distal & x 18 cm length Gore Excluder Endoprosthesis Main body (via RFA distal to lowest Renal A); 92m x 14 cm L Contralateral Limb for L Iliac - extended with 8 mm x 6cm LifeStar stent (post-dilated with 7 mm DEB).  Additional R Iliac PTA not required.   . 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 ([place] 09/15/2017   09-15-2017 DIAGNOSIS:  A. COLON POLYP X 2, ASCENDING; COLD SNARE:  - TUBULAR ADENOMA.  - NEGATIVE FOR HIGH-GRADE DYSPLASIA AND MALIGNANCY.  - NO ADDITIONAL TISSUE IDENTIFIED; FECAL MATERIAL PRESENT.   B. COLON POLYP X 2, DESCENDING; COLD SNARE:  - ADENOCARCINOMA ARISING IN A DYSPLASTIC SESSILE SERRATED ADENOMA, WITH  LYMPHOVASCULAR INVASION, SEE COMMENT.  - TUBULAR ADENOMA, 2 FRAGMENTS, NEGATIVE FO  . Arthritis   . Basal cell carcinoma of eyelid 01/03/2013   2019 - R side of Nose  . Benign neoplasm of ascending colon   . Benign neoplasm of descending colon   . Bilateral iliac artery stenosis ([place] 2007   Bilateral Iliac A stenting; extension of EVAR limbs into both Iliacs -- additional stent placed in L Iliac.  . Cancer [place]    Skin cancer  . Colon polyps   . Complication of anesthesia    severe confusion agitation requiring hospital admission x 4 days  . Compression fracture of fourth lumbar vertebra ([place] 04/2019   - s/p Kyphoplasty; T12, L3-5 ([facility]  . COPD (chronic obstructive pulmonary disease) (HCC)    Centrilobular Emphysema  . Coronary artery disease involving native coronary artery without  angina pectoris 1998   - s/p CABG, occluded SVG-D1; patent LIMA-LAD, SVG-OM, SVG- RCA  . Dementia ([place]    memory loss - started noting late 2019  . Falls frequently    broke foot  . Fracture 2021   left foot  . GERD (gastroesophageal reflux disease)   . Hemorrhoid 02/10/2015  . History of hiatal hernia   . History of kidney stones   . HOH (hard of hearing)   . Hypertension   . Lewy body dementia ([place] 04/04/2020  . Macular degeneration disease   . Osteoporosis   . PAF (paroxysmal atrial fibrillation) ([place]    on Eliquis for OAC - Rate Control   . Prostate cancer ([place]   . Prostate disorder   . Sciatic pain    Chronic  . Sleep apnea    cpap  . Temporary cerebral vascular dysfunction 02/10/2015   Had negative work-up.  July, 2012.  No medication changes, had forgot them that day, got dehydrated.   . Urinary incontinence    Past Surgical History:  Past Surgical History:  Procedure Laterality Date  . ABDOMINAL AORTIC ENDOVASCULAR STENT GRAFT Bilateral 05/18/2019   Procedure: ABDOMINAL AORTIC ENDOVASCULAR STENT GRAFT;  Surgeon: DAlgernon Huxley MD;  Location: [facility] ORS;  Service: Vascular;  Laterality: Bilateral;  . ANKLE SURGERY Left    ORIF   . Arch aortogram and carotid aortogram  06/02/2006   Dr FOneida Alardid surgery  . BASAL CELL CARCINOMA  EXCISION  04/2016   Dermatology  . CARDIAC CATHETERIZATION  01/30/1998    (Dr. H. Smith): Native LAD & mRCA CTO. 90% D1. Mod Cx. SVG-D1 CTO. SVG-RCA & SVG-OM along with LIMA-LAD patent;  LV NORMAL Fxn  . CAROTID ENDARTERECTOMY Left Oct. 29, 2007   Dr. Fields:  . CATARACT EXTRACTION W/PHACO Right 03/05/2017   Procedure: CATARACT EXTRACTION PHACO AND INTRAOCULAR LENS PLACEMENT (IOC);  Surgeon: Brasington, Chadwick, MD;  Location: [facility] ORS;  Service: Ophthalmology;  Laterality: Right;  US 00:58.5 AP% 10.6 CDE 6.20 Fluid Pack lot # [id number]  . CATARACT EXTRACTION W/PHACO Left 04/15/2017   Procedure: CATARACT EXTRACTION PHACO AND INTRAOCULAR LENS  PLACEMENT (IOC)  Left;  Surgeon: Brasington, Chadwick, MD;  Location[facility];  Service: Ophthalmology;  Laterality: Left;  . COLONOSCOPY WITH PROPOFOL N/A 09/15/2017   Procedure: COLONOSCOPY WITH PROPOFOL;  Surgeon: Wohl, Darren, MD;  Location: [facility];  Service: Endoscopy;  Laterality: N/A;  . COLONOSCOPY WITH PROPOFOL N/A 10/26/2018   Procedure: COLONOSCOPY WITH PROPOFOL;  Surgeon: Wohl, Darren, MD;  Location: [facility];  Service: Endoscopy;  Laterality: N/A;  . CORONARY ARTERY BYPASS GRAFT  1998   LIMA-LAD, SVG-OM, SVG-RPDA, SVG-DIAG  . CPET / MET  12/2012   Mild chronotropic incompetence - read 82% of predicted; also reduced effort; peak VO2 15.7 / 75% (did not reach Max effort) -- suggested ischemic response in last 1.5 minutes of exercise. Normal pulmonary function on PFTs but poor response to  . CYSTOSCOPY W/ RETROGRADES Left 01/13/2020   Procedure: CYSTOSCOPY WITH RETROGRADE PYELOGRAM;  Surgeon: Sninsky, Brian C, MD;  Location: [facility];  Service: Urology;  Laterality: Left;  . CYSTOSCOPY WITH STENT PLACEMENT Left 12/26/2019   Procedure: CYSTOSCOPY WITH STENT PLACEMENT;  Surgeon: Sninsky, Brian C, MD;  Location: [facility];  Service: Urology;  Laterality: Left;  . CYSTOSCOPY/URETEROSCOPY/HOLMIUM LASER/STENT PLACEMENT Left 01/13/2020   Procedure: CYSTOSCOPY/URETEROSCOPY/HOLMIUM LASER/STENT EXCHANGE;  Surgeon: Sninsky, Brian C, MD;  Location: [facility];  Service: Urology;  Laterality: Left;  . ENDOVASCULAR REPAIR/STENT GRAFT N/A 05/18/2019   Procedure: ENDOVASCULAR REPAIR/STENT GRAFT;  Surgeon: Dew, Jason S, MD;  [facility];; EVAR - 23 mm prox, 12 cm distal & x 18 cm length Gore Excluder Endoprosthesis Main body (via RFA distal to lowest Renal A); 12mm x 14 cm L Contralateral Limb for L Iliac - extended with 8 mm x 6cm LifeStar stent; no additional R Iliac PTA needed.   . ESOPHAGOGASTRODUODENOSCOPY (EGD) WITH PROPOFOL N/A 09/15/2017   Procedure:  ESOPHAGOGASTRODUODENOSCOPY (EGD) WITH PROPOFOL;  Surgeon: Wohl, Darren, MD;  Location: [facility];  Service: Endoscopy;  Laterality: N/A;  . FRACTURE SURGERY Right 03/19/2015   wrist  . FRACTURE SURGERY Left   . HIATAL HERNIA REPAIR    . ILIAC ARTERY STENT  12/15/2005   PTA and direct stenting rgt and lft common iliac arteries  . INCISION AND DRAINAGE OF WOUND Left 01/29/2021   Procedure: IRRIGATION AND DEBRIDEMENT Left Wrist and Removal of Plate;  Surgeon: Krasinski, Kevin, MD;  Location: [facility];  Service: Orthopedics;  Laterality: Left;  . INTRAMEDULLARY (IM) NAIL INTERTROCHANTERIC Left 01/29/2021   Procedure: INTRAMEDULLARY (IM) NAIL INTERTROCHANTRIC;  Surgeon: Krasinski, Kevin, MD;  Location: [facility];  Service: Orthopedics;  Laterality: Left;  . KYPHOPLASTY N/A 04/05/2019   Procedure: KYPHOPLASTY T12, L3, L4 L5;  Surgeon: Menz, Michael, MD;  Location: [facility];  Service: Orthopedics;  Laterality: N/A;  . NM GATED MYOVIEW ([facility])  05/'19; 8/'20   a) [facility]: Low   Risk - no ischemia or infarct.  EF > 65%. no RWMA;; b) ([facility]): Normal wall motion.  No ischemia or infarction.  . OPEN REDUCTION INTERNAL FIXATION (ORIF) DISTAL RADIAL FRACTURE Left 01/13/2019   Procedure: OPEN REDUCTION INTERNAL FIXATION (ORIF) DISTAL  RADIAL FRACTURE - LEFT - SLEEP APNEA;  Surgeon: Menz, Michael, MD;  Location: [facility];  Service: Orthopedics;  Laterality: Left;  . OPEN REDUCTION INTERNAL FIXATION (ORIF) DISTAL RADIAL FRACTURE Left 01/29/2021   Procedure: OPEN REDUCTION INTERNAL FIXATION (ORIF) DISTAL RADIAL FRACTURE;  Surgeon: Krasinski, Kevin, MD;  Location: [facility];  Service: Orthopedics;  Laterality: Left;  . ORIF ELBOW FRACTURE Right 01/01/2017   Procedure: OPEN REDUCTION INTERNAL FIXATION (ORIF) ELBOW/OLECRANON FRACTURE;  Surgeon: Michael Menz, MD;  Location: [facility];  Service: Orthopedics;  Laterality: Right;  . ORIF WRIST FRACTURE Right 03/19/2015   Procedure: OPEN REDUCTION INTERNAL FIXATION  (ORIF) WRIST FRACTURE;  Surgeon: Michael Menz, MD;  Location: [facility];  Service: Orthopedics;  Laterality: Right;  . SHOULDER ARTHROSCOPY Right   . THORACIC AORTA - CAROTID ANGIOGRAM  October 2007   Dr. 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 TRANSTHORACIC ECHOCARDIOGRAM  February 2016   [facility]: Normal LV function. Dilated left atrium.  . TRANSTHORACIC ECHOCARDIOGRAM  04/2019   ([facility] April 20, 2019): EF 55-60 %.  Mild LVH.  Relatively normal valves.   HPI:  Pt is an 87yoM who comes to [facility] on 5/26 after a fall and subsequent hip pain. Imaging revealing fractures of Left wrist and left hip, as well as Rt inferior pubic ramus. Wrist fracute is directly adjacent to prior ORIF volar plating. Lumbar films show acute on subacute L2 compression fracture with 7mm bony retropulsion with "moderate spinal stenosis now seen at this level". PMH: compression fracture and vertebroplasty at T12, L3, L4, L5, PAF on eliquis, COPD, HTN, CAD s/p CABG, OSA on CPAP, Parkinsonism, Lewy Body Dementia, Hiatal Hernia, GERD, Macular degeneration disease, AAA s/p endoprosthesis. After eliquis washout, pt went to OR on 5/31 for Left hip ORIF IM nailing, Left distal radius ORIF. Pt is WBAT at LLE and NWB LUE. Orthopedics following. PT/OT following.  CXR Imaging: "Ill-defined opacity right lower lobe concerning for developing  pneumonia. Lungs elsewhere clear, Except for calcified granuloma  right upper lobe".  Pt's Last CXR in 04/2020 prior to admit revealed: "Chronic pulmonary scarring. Right upper lobe  granuloma".  Pt does have a baseline of Hiatal Hernia, GERD.   Assessment / Plan / Recommendation Clinical Impression  Pt appears to present w/ grossly adequate oropharyngeal phase swallow w/ No overt oropharyngeal phase dysphagia noted, No neuromuscular deficits appreciated. Pt consumed po trials w/ No immediate, overt, clinical  s/s of aspiration during po trials. Pt appears at reduced risk for aspiration following general aspiration precautions. However, pt does have challenging factors that could impact oropharyngeal swallowing and risk for aspiration to be present including: macular degeneration disease impacting ability to self-feed, loose-fitting lower Denture plate for effective mastication, GERD, Hiatal Hernia, recent acute illness; baseline Lewy Body Dementia. These factors can increase risk for aspiration, dysphagia as well as decreased oral intake overall and require giving support to pt during meals.   During po trials, pt consumed all consistencies w/ no overt coughing, decline in vocal quality, or change in respiratory presentation during/post trials. Oral phase appeared grossly WFL w/ timely bolus management, mastication, and control of bolus propulsion for A-P transfer for swallowing. Oral   clearing achieved w/ all trial consistencies -- moistened, soft foods given. OM Exam appeared WFL w/ no unilateral weakness noted. Speech Clear w/ intermittent muttered speech which appears to be his baseline -- suspect related to the LB Dementia. Pt fed self w/ Mod setup support -- held Cup to drink.   Recommend continue a Regular consistency diet w/ well-Cut meats, moistened foods; Thin liquids -- VIA CUP for safer drinking of liquids, and pt should help to Hold Cup when drinking. Recommend general aspiration precautions, assistance at meals, Pills WHOLE in Puree for safer, easier swallowing. Education given on Pills in Puree; food consistencies and easy to eat options; assistance w/ Dentures; general aspiration precautions to pt and Son present. NSG to reconsult if any new needs arise while admitted. NSG updated, agreed. MD updated. Recommend Dietician f/u for support. SLP Visit Diagnosis: Dysphagia, unspecified (R13.10)    Aspiration Risk  Mild aspiration risk;Risk for inadequate nutrition/hydration (Reduced following general  precautions and given support)    Diet Recommendation   Regular consistency diet w/ well-Cut meats, moistened foods; Thin liquids -- VIA CUP for safer drinking of liquids, and pt should help to Hold Cup when drinking. Recommend general aspiration precautions, assistance at meals for feeding d/t vision deficits  Medication Administration: Whole meds with puree (for safer swallowing)    Other  Recommendations Recommended Consults: Consider GI evaluation;Consider esophageal assessment (Dietician f/u for support) Oral Care Recommendations: Oral care BID;Oral care before and after PO;Staff/trained caregiver to provide oral care (Denture care) Other Recommendations:  (n/a)   Follow up Recommendations None      Frequency and Duration  (n/a)   (n/a)       Prognosis Prognosis for Safe Diet Advancement: Fair (-Good) Barriers to Reach Goals: Cognitive deficits;Time post onset;Severity of deficits (Hiatal Hernia)      Swallow Study   General Date of Onset: 01/24/21 HPI: Pt is an 87yoM who comes to [facility] on 5/26 after a fall and subsequent hip pain. Imaging revealing fractures of Left wrist and left hip, as well as Rt inferior pubic ramus. Wrist fracute is directly adjacent to prior ORIF volar plating. Lumbar films show acute on subacute L2 compression fracture with 7mm bony retropulsion with "moderate spinal stenosis now seen at this level". PMH: compression fracture and vertebroplasty at T12, L3, L4, L5, PAF on eliquis, COPD, HTN, CAD s/p CABG, OSA on CPAP, Parkinsonism, Lewy Body Dementia, Hiatal Hernia, GERD, Macular degeneration disease, AAA s/p endoprosthesis. After eliquis washout, pt went to OR on 5/31 for Left hip ORIF IM nailing, Left distal radius ORIF. Pt is WBAT at LLE and NWB LUE. Orthopedics following. PT/OT following.  CXR Imaging: "Ill-defined opacity right lower lobe concerning for developing  pneumonia. Lungs elsewhere clear, Except for calcified granuloma  right upper lobe".  Pt's Last  CXR in 04/2020 prior to admit revealed: "Chronic pulmonary scarring. Right upper lobe  granuloma".  Pt does have a baseline of Hiatal Hernia, GERD. Type of Study: Bedside Swallow Evaluation Previous Swallow Assessment: none Diet Prior to this Study: Regular;Thin liquids Temperature Spikes Noted: No (wbc 11.1; s/p surgery) Respiratory Status: Room air History of Recent Intubation: No Behavior/Cognition: Alert;Cooperative;Pleasant mood;Distractible;Requires cueing (baseline LB dementia) Oral Cavity Assessment: Within Functional Limits Oral Care Completed by SLP: Recent completion by staff Oral Cavity - Dentition: Dentures, top;Dentures, bottom (ill-fitting bottom dentures) Vision: Impaired for self-feeding (Macular degeneration disease - Son assisting) Self-Feeding Abilities: Able to feed self with adaptive devices;Needs assist;Needs set up Patient Positioning: Upright in chair Baseline   Vocal Quality: Normal (sometimes muttered speech - LB dementia) Volitional Cough: Strong Volitional Swallow: Able to elicit    Oral/Motor/Sensory Function Overall Oral Motor/Sensory Function: Within functional limits   Ice Chips Ice chips: Within functional limits Presentation: Spoon (fed; 2 trials)   Thin Liquid Thin Liquid: Within functional limits Presentation: Cup;Self Fed (10+ trials)    Nectar Thick Nectar Thick Liquid: Not tested   Honey Thick Honey Thick Liquid: Not tested   Puree Puree: Within functional limits Presentation: Spoon (fed; 5 trials)   Solid     Solid: Within functional limits (w/ soft, cooked foods given) Presentation: Spoon (10+ trials) Other Comments: lunch arrived         , MS, CCC-SLP Speech Language Pathologist Rehab Services [phone] , 02/04/2021,2:22 PM     

## 2021-02-04 NOTE — Discharge Summary (Signed)
Physician Discharge Summary  [name] XBM:[id number] DOB: 1934-02-29 DOA: 01/24/2021  PCP: [name], MD  Admit date: 01/24/2021 Discharge date: 02/04/2021  Admitted From: home  Disposition: SNF  Recommendations for Outpatient Follow-up:  1. Follow up with PCP in 1-2 weeks 2. F/u w/ ortho surg, Dr. Mack Guise, in 2 weeks   Home Health: no  Equipment/Devices: oxygen 2L qhs prn   Discharge Condition: stable  CODE STATUS: full  Diet recommendation: Regular diet w/ extra Gravies on foods to moisten, w/ Thin liquids VIA CUP ONLY - NO Straws. General aspiration precautions. Sitting Fully Upright for All po intake. Assistance w/ feeding at meals -- pt MUST Hold his own Cup when drinking liquids. Pills WHOLE in Puree.   Brief/Interim Summary: HPI was taken from Dr. Damita Dunnings: [name] is a 87 y.o. male with medical history significant for Paroxysmal A. fib on Eliquis, COPD, HTN, CAD with history of CABG, OSA on CPAP and dementia who presents from home following an accidental fall in which she stepped back falling onto his left side with immediate pain in his left hip and left wrist.  Had no loss of consciousness and denies hitting head.  He was not recently ill, and denies recent nausea, vomiting, chest pain or shortness of breath, abdominal pain or change in bowel habit ED course: On arrival, afebrile with BP 115/73, pulse 86 respirations 14 with O2 sat 95% on room air.  Blood work unremarkable except for hemoglobin of 11.3 which is his baseline. EKG, personally viewed and interpreted: Sinus at 86 with nonspecific ST-T wave changes Imaging: Chest x-ray: No acute disease Left wrist x-ray: Displaced fracture distal radial diaphysis Pelvis and left hip x-ray: Mildly displaced intertrochanteric left hip fracture.  Nondisplaced fracture right inferior pubic ramus  The ED provider spoke with orthopedist, Dr. Mack Guise who will wait for Eliquis washout prior to taken to the OR.   Hospitalist consulted for admission.   As per Dr. Mal Misty: [name] is a 87 y.o. male with medical history of paroxysmal atrial fibrillation on Eliquis, COPD, hypertension, AAA, colon cancer, prostate cancer, vitamin B12 deficiency anemia, CAD with history of CABG,PVD, bilateral carotid artery stenosis, OSA on CPAP, dementia, who was brought from the memory care unit because of accidental fall.  Reportedly, he fell on his left side and he complained of pain in the left wrist and left hip.  Work-up revealed left displaced fracture of the distal radial diaphysis.  He was also found to have a left displaced intertrochanteric hip fracture,  nondisplaced fracture of right inferior pubic ramus and acute on subacute compression fracture and probable L2 vertebral body.[name]  He underwent intramedullary nail intertrochanteric left hip fracture and open reduction internal fixation of distal radius fracture.    Hospital course from Dr. Jimmye Norman 6/2-02/04/21: Pt is s/p intramedullary nail intertrochanteric of the left hip and open reduction internal fixation of the distal radius fracture as per ortho surg. Of note, pt was found to aspiration pneumonia and was started on IV unasyn. Pt was switched to po augmentin at d/c to finish the course at SNF. Speech therapy also evaluated the pt and recommended a regular diet w/ certain guidelines as stated above and below.  Discharge Diagnoses:  Principal Problem:   Closed left hip fracture, initial encounter ([place]) Active Problems:   Hx of CABG   Essential hypertension   OSA on CPAP   PAF (paroxysmal atrial fibrillation) (HCC) CHA2DS2-VASc = 4. AC = Eliquis   COPD (chronic  obstructive pulmonary disease) (HCC)   Accidental fall   Closed fracture of left distal radius   Dementia without behavioral disturbance (HCC)   Pubic ramus fracture, left, closed, initial encounter ([place])   Hip fracture (HCC)   Protein-calorie malnutrition, severe   Left intertrochanteric  hip fracture, left radius fracture, fracture of right inferior pubic ramus, acute on subacute L2 compression fracture: S/p intramedullary nail intertrochanteric left hip fracture and open reduction internal fixation of distal radius fracture.  Norco, fentanyl prn for pain. PT/OT recs SNF   Likely aspiration pneumonia: continue on IV unasyn while inpatient and start augmentin at d/c. Continue on bronchodilators. COVID19 neg. Encourage incentive spirometry. Regular diet w/ extra Gravies on foods to moisten, w/ Thin liquids VIA CUP ONLY - NO Straws. General aspiration precautions. Sitting Fully Upright for All po intake. Assistance w/ feeding at meals -- pt MUST Hold his own Cup when drinking liquids. Pills WHOLE in Puree- as per speech   PAF: continue on eliquis, metoprolol   ACD: H&H are stable. No transfusion needed currently   COPD: w/o exacerbation. Encourage incentive spirometry. Continue on bronchodilators   OSA: previously used CPAP qhs but pt kept trying to get up independently in the middle of the night. Continue w/ oxygen qhs prn   Lewy body dementia: continue on home dose of donepezil   Severe protein calorie malnutrition: continue w/ nutritional supplements    Discharge Instructions  Discharge Instructions    Diet general   Complete by: As directed    Regular diet w/ extra Gravies on foods to moisten, w/ Thin liquids VIA CUP ONLY - NO Straws. General aspiration precautions. Sitting Fully Upright for All po intake. Assistance w/ feeding at meals -- pt MUST Hold his own Cup when drinking liquids. Pills WHOLE in Puree.   Discharge instructions   Complete by: As directed    F/u w/ ortho surg, Dr. Mack Guise, in 2 weeks. F/u w/ PCP in 1-2 weeks   Increase activity slowly   Complete by: As directed    No wound care   Complete by: As directed      Allergies as of 02/04/2021      Reactions   Clonazepam Other (See Comments)   Altered mental status   Codeine Other (See Comments)    Altered mental status.   Ropinirole Swelling   Other reaction(s): Hallucination   Hydrocodone    confusion   Niacin And Related Other (See Comments)   Flushing of skin   Oxycodone Other (See Comments)   confusion confusion   Tramadol    Other reaction(s): Hallucination      Medication List    TAKE these medications   acetaminophen 500 MG tablet Commonly known as: TYLENOL Take 500-1,000 mg by mouth every 6 (six) hours as needed for mild pain or moderate pain.   amoxicillin-clavulanate 875-125 MG tablet Commonly known as: Augmentin Take 1 tablet by mouth 2 (two) times daily for 5 days.   Cholecalciferol 50 MCG (2000 UT) Tbdp Take 2,000 Units by mouth daily.   [employer] Bacitracin ointment Generic drug: bacitracin Apply 1 application topically every evening. Apply to right eyelid topically for 4 weeks in the evening after instillation of Systane eye drops.   donepezil 10 MG tablet Commonly known as: ARICEPT Take 10 mg by mouth at bedtime.   Eliquis 5 MG Tabs tablet Generic drug: apixaban TAKE ONE TABLET TWICE DAILY   ezetimibe 10 MG tablet Commonly known as: ZETIA TAKE 1 TABLET BY MOUTH DAILY  finasteride 5 MG tablet Commonly known as: PROSCAR TAKE ONE TABLET BY MOUTH EVERY DAY   fluticasone 50 MCG/ACT nasal spray Commonly known as: FLONASE PLACE TWO SPRAYS IN BOTH NOSRTILS EVERY DAY AS NEEDED FOR ALLERGIES What changed: See the new instructions.   lisinopril 2.5 MG tablet Commonly known as: ZESTRIL TAKE ONE TABLET BY MOUTH TWICE DAILY   LORazepam 0.5 MG tablet Commonly known as: ATIVAN Take 0.5 mg by mouth every 8 (eight) hours as needed for anxiety or agitation.   Magnesium Oxide 500 MG Tabs Take 500 mg by mouth every evening.   metoprolol tartrate 25 MG tablet Commonly known as: LOPRESSOR Take 25 mg by mouth 2 (two) times daily.   mirtazapine 7.5 MG tablet Commonly known as: REMERON Take 7.5 mg by mouth at bedtime.   Myrbetriq 50 MG Tb24  tablet Generic drug: mirabegron ER TAKE ONE TABLET BY MOUTH EVERY DAY   PRESERVISION AREDS 2 PO Take 1 tablet by mouth 2 (two) times daily.   rosuvastatin 20 MG tablet Commonly known as: CRESTOR TAKE ONE TABLET BY MOUTH EVERY DAY   Systane 0.4-0.3 % Soln Generic drug: Polyethyl Glycol-Propyl Glycol Place 1 drop into both eyes at bedtime.   traZODone 50 MG tablet Commonly known as: DESYREL Take 50 mg by mouth at bedtime.   Wixela Inhub 250-50 MCG/ACT Aepb Generic drug: fluticasone-salmeterol Inhale 1 puff into the lungs in the morning and at bedtime.       Contact information for follow-up providers    Thornton Park, MD. Schedule an appointment as soon as possible for a visit on 02/22/2021.   Specialty: Orthopedic Surgery Why: @ 10:00 am Contact information: [place]  [id number] [phone]            Contact information for after-discharge care    Destination    [facility] Preferred SNF .   Service: Skilled Nursing Contact information: [place] [phone]                 Allergies  Allergen Reactions  . Clonazepam Other (See Comments)    Altered mental status  . Codeine Other (See Comments)    Altered mental status.  . Ropinirole Swelling    Other reaction(s): Hallucination  . Hydrocodone     confusion  . Niacin And Related Other (See Comments)    Flushing of skin  . Oxycodone Other (See Comments)    confusion confusion  . Tramadol     Other reaction(s): Hallucination    Consultations:  Ortho surg, Dr. Mack Guise    Procedures/Studies: DG Chest 1 View  Result Date: 01/24/2021 CLINICAL DATA:  Left hip and wrist fractures.  Pre-op clearance exam EXAM: CHEST  1 VIEW COMPARISON:  04/04/2020 FINDINGS: The heart size and mediastinal contours are within normal limits. Prior CABG again noted. Small calcified granuloma again seen in the right upper lobe. Both  lungs are otherwise clear. The visualized skeletal structures are unremarkable. IMPRESSION: No active cardiopulmonary disease. Electronically Signed   By: Marlaine Hind M.D.   On: 01/24/2021 21:24   DG Wrist Complete Left  Result Date: 01/29/2021 CLINICAL DATA:  Status post left wrist ORIF EXAM: LEFT WRIST - COMPLETE 3+ VIEW COMPARISON:  01/24/2021 left wrist radiographs FINDINGS: Status post transfixation of distal metadiaphysis left radius fracture in near-anatomic alignment by a volar surgical plate with multiple interlocking screws with no hardware fracture. Volar skin staples. Generalized soft tissue swelling and expected scattered  soft tissue gas in the volar left wrist soft tissues. No dislocation at the left wrist. Minimally distally displaced ulnar styloid fracture. Moderate first carpometacarpal joint osteoarthritis. No suspicious focal osseous lesions. Healed distal left ulna shaft deformity. IMPRESSION: 1. Near-anatomic alignment of distal left radius fracture status post ORIF. 2. Minimally displaced ulnar styloid fracture. Electronically Signed   By: Ilona Sorrel M.D.   On: 01/29/2021 13:58   DG Wrist Complete Left  Result Date: 01/24/2021 CLINICAL DATA:  Fall. Left wrist pain and deformity. Initial encounter. EXAM: LEFT WRIST - COMPLETE 3+ VIEW COMPARISON:  None. FINDINGS: Internal fixation plate and screws are seen in the distal radius. A displaced fracture is seen involving the distal radial diaphysis, near the proximal margin of the fixation plate, with moderate dorsal angulation of the distal radius. No other acute fractures are identified. Old fracture deformity is seen involving the distal ulnar diaphysis and ulnar styloid process. Advanced osteoarthritis is seen involving the base of the thumb. Generalized osteopenia also noted. IMPRESSION: Displaced fracture of the distal radial diaphysis, near the proximal margin of the fixation plate, with moderate dorsal angulation. Old fracture  deformities of distal ulnar diaphysis and ulnar styloid process. Electronically Signed   By: Marlaine Hind M.D.   On: 01/24/2021 21:18   CT Head Wo Contrast  Result Date: 01/24/2021 CLINICAL DATA:  Fall. EXAM: CT HEAD WITHOUT CONTRAST CT CERVICAL SPINE WITHOUT CONTRAST TECHNIQUE: Multidetector CT imaging of the head and cervical spine was performed following the standard protocol without intravenous contrast. Multiplanar CT image reconstructions of the cervical spine were also generated. COMPARISON:  11/16/2020 FINDINGS: CT HEAD FINDINGS Brain: There is no evidence for acute hemorrhage, hydrocephalus, mass lesion, or abnormal extra-axial fluid collection. No definite CT evidence for acute infarction. Diffuse loss of parenchymal volume is consistent with atrophy. Patchy low attenuation in the deep hemispheric and periventricular white matter is nonspecific, but likely reflects chronic microvascular ischemic demyelination. Vascular: No hyperdense vessel or unexpected calcification. Skull: No evidence for fracture. No worrisome lytic or sclerotic lesion. Sinuses/Orbits: The visualized paranasal sinuses and mastoid air cells are clear. Visualized portions of the globes and intraorbital fat are unremarkable. Other: None. CT CERVICAL SPINE FINDINGS Alignment: Normal. Skull base and vertebrae: No acute fracture. No primary bone lesion or focal pathologic process. Soft tissues and spinal canal: No prevertebral fluid or swelling. No visible canal hematoma. Disc levels: Diffuse loss of disc height with endplate degeneration noted in the cervical spine. Scattered facet osteoarthritis noted bilaterally. Upper chest: Biapical pleuroparenchymal scarring evident. Other: None. IMPRESSION: 1. No acute intracranial abnormality. Atrophy with chronic small vessel white matter ischemic disease. 2. Degenerative changes in the cervical spine without fracture. Electronically Signed   By: Misty Stanley M.D.   On: 01/24/2021 21:44   CT  Cervical Spine Wo Contrast  Result Date: 01/24/2021 CLINICAL DATA:  Fall. EXAM: CT HEAD WITHOUT CONTRAST CT CERVICAL SPINE WITHOUT CONTRAST TECHNIQUE: Multidetector CT imaging of the head and cervical spine was performed following the standard protocol without intravenous contrast. Multiplanar CT image reconstructions of the cervical spine were also generated. COMPARISON:  11/16/2020 FINDINGS: CT HEAD FINDINGS Brain: There is no evidence for acute hemorrhage, hydrocephalus, mass lesion, or abnormal extra-axial fluid collection. No definite CT evidence for acute infarction. Diffuse loss of parenchymal volume is consistent with atrophy. Patchy low attenuation in the deep hemispheric and periventricular white matter is nonspecific, but likely reflects chronic microvascular ischemic demyelination. Vascular: No hyperdense vessel or unexpected calcification. Skull: No evidence for  fracture. No worrisome lytic or sclerotic lesion. Sinuses/Orbits: The visualized paranasal sinuses and mastoid air cells are clear. Visualized portions of the globes and intraorbital fat are unremarkable. Other: None. CT CERVICAL SPINE FINDINGS Alignment: Normal. Skull base and vertebrae: No acute fracture. No primary bone lesion or focal pathologic process. Soft tissues and spinal canal: No prevertebral fluid or swelling. No visible canal hematoma. Disc levels: Diffuse loss of disc height with endplate degeneration noted in the cervical spine. Scattered facet osteoarthritis noted bilaterally. Upper chest: Biapical pleuroparenchymal scarring evident. Other: None. IMPRESSION: 1. No acute intracranial abnormality. Atrophy with chronic small vessel white matter ischemic disease. 2. Degenerative changes in the cervical spine without fracture. Electronically Signed   By: Misty Stanley M.D.   On: 01/24/2021 21:44   CT Lumbar Spine Wo Contrast  Result Date: 01/24/2021 CLINICAL DATA:  Initial evaluation for acute low back pain, recent fall. EXAM:  CT LUMBAR SPINE WITHOUT CONTRAST TECHNIQUE: Multidetector CT imaging of the lumbar spine was performed without intravenous contrast administration. Multiplanar CT image reconstructions were also generated. COMPARISON:  Recent CT from 11/16/2020. FINDINGS: Segmentation: Standard. Lowest well-formed disc space labeled the L5-S1 level. Alignment: Levoscoliosis with apex at L3-4. No significant listhesis. Vertebrae: There is an acute on subacute burst type compression fracture involving the L2 vertebral body with progressive collapse and height loss since previous. Height loss now measures up to 65% with progressive 7 mm bony retropulsion. Multiple additional chronic compression fractures again noted, with sequelae of prior vertebral augmentation at T12, L3, L4, and L5. These are stable in appearance. Additional chronic fracture of the distal sacrum/coccyx noted (series 7, image 48). No other acute osseous abnormality. No discrete or worrisome osseous lesions. Paraspinal and other soft tissues: Paraspinous edema adjacent to the L2 compression fracture. Chronic fatty atrophy of the posterior paraspinous musculature. Treated intra-abdominal aneurysm with aorto bi-iliac stent in place. Colonic diverticulosis partially visualized. Probable nonobstructive left renal nephrolithiasis. Disc levels: L1-2: Progressive 7 mm bony retropulsion related to the L2 compression fracture with secondary flattening of the ventral thecal sac. Mild facet hypertrophy. Moderate spinal stenosis is now seen at this level. Moderate bilateral L1 foraminal narrowing. L2-3: Mild disc bulge with annular calcification. Mild facet hypertrophy. No spinal stenosis. Mild to moderate bilateral L2 foraminal narrowing. L3-4: Degenerative intervertebral disc space narrowing with diffuse disc bulge, eccentric to the right. Annular calcification with reactive endplate change. Moderate right worse than left facet hypertrophy. Mild canal with mild to moderate  right worse than left lateral recess stenosis. Moderate right worse than left L3 foraminal narrowing. L4-5: Degenerative intervertebral disc space narrowing with diffuse disc bulge and disc desiccation. Prominent right-sided reactive endplate spurring. Moderate right with mild left facet degeneration. No significant spinal stenosis. Moderate right worse than left L4 foraminal narrowing. L5-S1: Degenerative intervertebral disc space narrowing with diffuse disc bulge and reactive endplate spurring. Mild facet hypertrophy. No significant spinal stenosis. Moderate bilateral L5 foraminal narrowing. IMPRESSION: 1. Acute on subacute compression fracture involving the L2 vertebral body with progressive collapse and height loss since previous, now measuring up to 65% with progressive 7 mm bony retropulsion. Moderate spinal stenosis now seen at this level. 2. Multiple additional chronic compression fractures with sequelae of prior vertebral augmentation at T12, L3, L4, and L5, stable. 3. Additional chronic fracture of the distal sacrum/coccyx. 4. Moderate multilevel degenerative spondylosis with resultant moderate bilateral foraminal narrowing at L1 through L5. Electronically Signed   By: Jeannine Boga M.D.   On: 01/24/2021 22:06   DG  Chest Port 1 View  Result Date: 02/02/2021 CLINICAL DATA:  Cough and fever EXAM: PORTABLE CHEST 1 VIEW COMPARISON:  Jan 24, 2021 FINDINGS: Calcified granuloma noted in right upper lobe. Ill-defined airspace opacity noted in the right lower lung region. Lungs elsewhere are clear. Heart size and pulmonary vascular normal. Status post coronary artery bypass grafting. Status post kyphoplasty procedure at T12. IMPRESSION: Ill-defined opacity right lower lobe concerning for developing pneumonia. Note that atypical pneumonia could present in this manner. Check of COVID-19 status may be warranted given this appearance. Lungs elsewhere clear. Except for calcified granuloma right upper lobe.  Heart size normal. Status post coronary artery bypass grafting. Electronically Signed   By: Lowella Grip III M.D.   On: 02/02/2021 09:28   DG MINI C-ARM IMAGE ONLY  Result Date: 01/29/2021 There is no interpretation for this exam.  This order is for images obtained during a surgical procedure.  Please See "Surgeries" Tab for more information regarding the procedure.   DG HIP OPERATIVE UNILAT W OR W/O PELVIS LEFT  Result Date: 01/29/2021 CLINICAL DATA:  Intramedullary nail fixation for fracture EXAM: OPERATIVE LEFT HIP  2 VIEWS TECHNIQUE: Fluoroscopic spot image(s) were submitted for interpretation post-operatively. FLUOROSCOPY TIME:  1 minutes 30 seconds; 4 acquired images COMPARISON:  Preoperative study Jan 24, 2021 FINDINGS: Frontal and lateral views show nail fixation through an intertrochanteric femur fracture on the left with alignment essentially anatomic at the fracture site. Tip of screw in proximal left femoral head. No other fracture. No dislocation. Mild narrowing left hip joint. IMPRESSION: Screw and nail fixation through intertrochanteric femur fracture with alignment fracture site essentially anatomic. Screw tip in proximal femoral head. No new fracture. No dislocation. Mild narrowing left hip joint. Electronically Signed   By: Lowella Grip III M.D.   On: 01/29/2021 09:51   DG Hip Unilat W or Wo Pelvis 2-3 Views Left  Result Date: 01/24/2021 CLINICAL DATA:  Fall.  Left hip pain.  Initial encounter. EXAM: DG HIP (WITH OR WITHOUT PELVIS) 2-3V LEFT COMPARISON:  11/16/2020 FINDINGS: Mildly displaced intertrochanteric left hip fracture seen. No evidence of dislocation. Generalized osteopenia is noted. A nondisplaced fracture seen involving the right inferior pubic ramus. No other pelvic fractures identified. IMPRESSION: Mildly displaced intertrochanteric left hip fracture. Nondisplaced fracture of right inferior pubic ramus. Osteopenia. Electronically Signed   By: Marlaine Hind M.D.    On: 01/24/2021 21:15   DG FEMUR PORT MIN 2 VIEWS LEFT  Result Date: 01/29/2021 CLINICAL DATA:  Status post left intramedullary nail EXAM: LEFT FEMUR PORTABLE 2 VIEWS COMPARISON:  01/29/2021 left hip radiographs FINDINGS: Status post transfixation of intertrochanteric proximal left femur fracture in near-anatomic alignment by intramedullary rod with interlocking left femoral neck pin and 2 distal interlocking screws with no hardware fracture. Skin staples lateral to the distal left femur. No suspicious osseous lesions. Vascular calcifications in the posterior soft tissues. No dislocation at the left knee or left hip on these views. Partially visualized vascular stent in the left pelvis. IMPRESSION: Near-anatomic alignment of intertrochanteric proximal left femur fracture status post ORIF. Electronically Signed   By: Ilona Sorrel M.D.   On: 01/29/2021 13:55      Subjective: Pt c/o fatigue.    Discharge Exam: Vitals:   02/04/21 0423 02/04/21 0752  BP: 123/70 124/85  Pulse: (!) 103 69  Resp: 17 15  Temp: 97.9 F (36.6 C) 98.3 F (36.8 C)  SpO2: 99% 95%   Vitals:   02/03/21 1711 02/03/21 1940 02/04/21 0423  02/04/21 0752  BP: (!) 105/59 (!) 107/59 123/70 124/85  Pulse: 92 79 (!) 103 69  Resp:  17 17 15   Temp:  97.9 F (36.6 C) 97.9 F (36.6 C) 98.3 F (36.8 C)  TempSrc:    Oral  SpO2:  97% 99% 95%  Weight:      Height:        General: Pt is alert, awake, not in acute distress. Frail appearing  Cardiovascular: S1/S2 +, no rubs, no gallops Respiratory: diminished breath sounds b/l  Abdominal: Soft, NT, ND, bowel sounds + Extremities: no edema, no cyanosis    The results of significant diagnostics from this hospitalization (including imaging, microbiology, ancillary and laboratory) are listed below for reference.     Microbiology: Recent Results (from the past 240 hour(s))  MRSA PCR Screening     Status: None   Collection Time: 01/28/21 10:51 AM   Specimen: Nasopharyngeal   Result Value Ref Range Status   MRSA by PCR NEGATIVE NEGATIVE Final    Comment:        The GeneXpert MRSA Assay (FDA approved for NASAL specimens only), is one component of a comprehensive MRSA colonization surveillance program. It is not intended to diagnose MRSA infection nor to guide or monitor treatment for MRSA infections. Performed at [facility], [place], [place] [address]   SARS CORONAVIRUS 2 (TAT 6-24 HRS) Nasopharyngeal Nasopharyngeal Swab     Status: None   Collection Time: 02/01/21 12:35 PM   Specimen: Nasopharyngeal Swab  Result Value Ref Range Status   SARS Coronavirus 2 NEGATIVE NEGATIVE Final    Comment: (NOTE) SARS-CoV-2 target nucleic acids are NOT DETECTED.  The SARS-CoV-2 RNA is generally detectable in upper and lower respiratory specimens during the acute phase of infection. Negative results do not preclude SARS-CoV-2 infection, do not rule out co-infections with other pathogens, and should not be used as the sole basis for treatment or other patient management decisions. Negative results must be combined with clinical observations, patient history, and epidemiological information. The expected result is Negative.  Fact Sheet for Patients: SugarRoll.be  Fact Sheet for Healthcare Providers: https://www.woods-mathews.com/  This test is not yet approved or cleared by the [place] FDA and  has been authorized for detection and/or diagnosis of SARS-CoV-2 by FDA under an Emergency Use Authorization (EUA). This EUA will remain  in effect (meaning this test can be used) for the duration of the COVID-19 declaration under Se ction 564(b)(1) of the Act, 21 U.S.C. section 360bbb-3(b)(1), unless the authorization is terminated or revoked sooner.  Performed at [facility], [place] [address], [place], [place] [address]   CULTURE, BLOOD (ROUTINE X 2) w Reflex to ID Panel     Status: None  (Preliminary result)   Collection Time: 02/02/21  8:02 AM   Specimen: BLOOD  Result Value Ref Range Status   Specimen Description BLOOD LEFT ANTECUBITAL  Final   Special Requests   Final    BOTTLES DRAWN AEROBIC AND ANAEROBIC Blood Culture adequate volume   Culture   Final    NO GROWTH 2 DAYS Performed at [facility], [address] [place], [place] [address]    Report Status PENDING  Incomplete  CULTURE, BLOOD (ROUTINE X 2) w Reflex to ID Panel     Status: None (Preliminary result)   Collection Time: 02/02/21  8:03 AM   Specimen: BLOOD  Result Value Ref Range Status   Specimen Description BLOOD BLOOD RIGHT HAND  Final   Special Requests   Final    BOTTLES DRAWN AEROBIC AND ANAEROBIC Blood Culture adequate volume   Culture   Final    NO GROWTH 2 DAYS Performed at [facility], [address] [place], [place] [address]    Report Status PENDING  Incomplete     Labs: BNP (last 3 results) No results for input(s): BNP in the last 8760 hours. Basic Metabolic Panel: Recent Labs  Lab 01/31/21 0222 02/01/21 0753 02/02/21 0553 02/03/21 0542 02/04/21 0413  NA 139 138 140 139 139  K 4.1 4.1 4.4 4.0 3.7  CL 106 107 107 108 109  CO2 26 23 25 26 23   GLUCOSE 112* 102* 100* 90 94  BUN 31* 24* 18 15 16   CREATININE 0.77 0.78 0.63 0.71 0.74  CALCIUM 8.4* 8.3* 8.4* 8.3* 8.1*   Liver Function Tests: No results for input(s): AST, ALT, ALKPHOS, BILITOT, PROT, ALBUMIN in the last 168 hours. No results for input(s): LIPASE, AMYLASE in the last 168 hours. No results for input(s): AMMONIA in the last 168 hours. CBC: Recent Labs  Lab 01/31/21 0222 02/01/21 0540 02/02/21 0553 02/03/21 0542 02/04/21 0413  WBC 12.0* 10.3 11.2* 9.3 11.1*  HGB 7.4* 7.5* 7.5* 7.9* 7.9*  HCT 22.6* 23.3* 22.9* 24.6* 23.8*  MCV 96.2 96.7 96.6 97.2 97.1  PLT 179 207 227 259 267   Cardiac Enzymes: No results for input(s): CKTOTAL, CKMB, CKMBINDEX, TROPONINI in the last 168  hours. BNP: Invalid input(s): POCBNP CBG: No results for input(s): GLUCAP in the last 168 hours. D-Dimer No results for input(s): DDIMER in the last 72 hours. Hgb A1c No results for input(s): HGBA1C in the last 72 hours. Lipid Profile No results for input(s): CHOL, HDL, LDLCALC, TRIG, CHOLHDL, LDLDIRECT in the last 72 hours. Thyroid function studies No results for input(s): TSH, T4TOTAL, T3FREE, THYROIDAB in the last 72 hours.  Invalid input(s): FREET3 Anemia work up No results for input(s): VITAMINB12, FOLATE, FERRITIN, TIBC, IRON, RETICCTPCT in the last 72 hours. Urinalysis    Component Value Date/Time   COLORURINE YELLOW (A) 09/27/2020 1131   APPEARANCEUR CLEAR (A) 09/27/2020 1131   APPEARANCEUR Cloudy (A) 01/25/2020 1009   LABSPEC >1.046 (H) 09/27/2020 1131   LABSPEC 1.011 10/01/2012 0420   PHURINE 7.0 09/27/2020 1131   GLUCOSEU NEGATIVE 09/27/2020 1131   GLUCOSEU Negative 10/01/2012 0420   HGBUR NEGATIVE 09/27/2020 1131   BILIRUBINUR Negative 11/07/2020 1115   BILIRUBINUR Negative 01/25/2020 1009   BILIRUBINUR Negative 10/01/2012 0420   KETONESUR NEGATIVE 09/27/2020 1131   PROTEINUR Negative 11/07/2020 1115   PROTEINUR NEGATIVE 09/27/2020 1131   UROBILINOGEN 0.2 11/07/2020 1115   NITRITE negative 11/07/2020 1115   NITRITE NEGATIVE 09/27/2020 1131   LEUKOCYTESUR Negative 11/07/2020 1115   LEUKOCYTESUR NEGATIVE 09/27/2020 1131   LEUKOCYTESUR Negative 10/01/2012 0420   Sepsis Labs Invalid input(s): PROCALCITONIN,  WBC,  LACTICIDVEN Microbiology Recent Results (from the past 240 hour(s))  MRSA PCR Screening     Status: None   Collection Time: 01/28/21 10:51 AM   Specimen: Nasopharyngeal  Result Value Ref Range Status   MRSA by PCR NEGATIVE NEGATIVE Final    Comment:        The GeneXpert MRSA Assay (FDA approved for NASAL specimens only), is one component of a comprehensive MRSA colonization surveillance program. It is not intended to diagnose MRSA infection  nor to guide or monitor treatment for MRSA infections. Performed at [facility], [address], [place], [place] [address]  SARS CORONAVIRUS 2 (TAT 6-24 HRS) Nasopharyngeal Nasopharyngeal Swab     Status: None   Collection Time: 02/01/21 12:35 PM   Specimen: Nasopharyngeal Swab  Result Value Ref Range Status   SARS Coronavirus 2 NEGATIVE NEGATIVE Final    Comment: (NOTE) SARS-CoV-2 target nucleic acids are NOT DETECTED.  The SARS-CoV-2 RNA is generally detectable in upper and lower respiratory specimens during the acute phase of infection. Negative results do not preclude SARS-CoV-2 infection, do not rule out co-infections with other pathogens, and should not be used as the sole basis for treatment or other patient management decisions. Negative results must be combined with clinical observations, patient history, and epidemiological information. The expected result is Negative.  Fact Sheet for Patients: SugarRoll.be  Fact Sheet for Healthcare Providers: https://www.woods-mathews.com/  This test is not yet approved or cleared by the [place] FDA and  has been authorized for detection and/or diagnosis of SARS-CoV-2 by FDA under an Emergency Use Authorization (EUA). This EUA will remain  in effect (meaning this test can be used) for the duration of the COVID-19 declaration under Se ction 564(b)(1) of the Act, 21 U.S.C. section 360bbb-3(b)(1), unless the authorization is terminated or revoked sooner.  Performed at [facility], [place] [address], [place], [place] [address]   CULTURE, BLOOD (ROUTINE X 2) w Reflex to ID Panel     Status: None (Preliminary result)   Collection Time: 02/02/21  8:02 AM   Specimen: BLOOD  Result Value Ref Range Status   Specimen Description BLOOD LEFT ANTECUBITAL  Final   Special Requests   Final    BOTTLES DRAWN AEROBIC AND ANAEROBIC Blood Culture adequate volume   Culture   Final     NO GROWTH 2 DAYS Performed at [facility], [address] [place], [place] [address]    Report Status PENDING  Incomplete  CULTURE, BLOOD (ROUTINE X 2) w Reflex to ID Panel     Status: None (Preliminary result)   Collection Time: 02/02/21  8:03 AM   Specimen: BLOOD  Result Value Ref Range Status   Specimen Description BLOOD BLOOD RIGHT HAND  Final   Special Requests   Final    BOTTLES DRAWN AEROBIC AND ANAEROBIC Blood Culture adequate volume   Culture   Final    NO GROWTH 2 DAYS Performed at [facility], [address], [place], [place] [address]    Report Status PENDING  Incomplete     Time coordinating discharge: Over 30 minutes  SIGNED:   Wyvonnia Dusky, MD  [facility] 02/04/2021, 12:24 PM Pager   If 7PM-7AM, please contact night-coverage

## 2021-02-04 NOTE — Progress Notes (Signed)
Subjective:  POD #6 s/p ORIF of left distal radius fracture and intramedullary fixation of left intertrochanteric hip fracture.  Patient has dementia and is unable to provide an accurate history.  His son is in the room and states he is able to get up out of bed to a chair with physical therapy today..   Patient reports no significant pain .  Objective:   VITALS:   Vitals:   02/03/21 1711 02/03/21 1940 02/04/21 0423 02/04/21 0752  BP: (!) 105/59 (!) 107/59 123/70 124/85  Pulse: 92 79 (!) 103 69  Resp:  17 17 15   Temp:  97.9 F (36.6 C) 97.9 F (36.6 C) 98.3 F (36.8 C)  TempSrc:    Oral  SpO2:  97% 99% 95%  Weight:      Height:        PHYSICAL EXAM: Left upper extremity: Splint and dressing are clean dry and intact.  Patient is neurovascular intact.  He can flex and extend his fingers and his fingers are well-perfused.  Left lower extremity: Neurovascular intact Sensation intact distally Intact pulses distally Dorsiflexion/Plantar flexion intact Incision: no drainage No cellulitis present Compartment soft  LABS  Results for orders placed or performed during the hospital encounter of 01/24/21 (from the past 24 hour(s))  CBC     Status: Abnormal   Collection Time: 02/04/21  4:13 AM  Result Value Ref Range   WBC 11.1 (H) 4.0 - 10.5 K/uL   RBC 2.45 (L) 4.22 - 5.81 MIL/uL   Hemoglobin 7.9 (L) 13.0 - 17.0 g/dL   HCT 23.8 (L) 39.0 - 52.0 %   MCV 97.1 80.0 - 100.0 fL   MCH 32.2 26.0 - 34.0 pg   MCHC 33.2 30.0 - 36.0 g/dL   RDW 16.9 (H) 11.5 - 15.5 %   Platelets 267 150 - 400 K/uL   nRBC 0.0 0.0 - 0.2 %  Basic metabolic panel     Status: Abnormal   Collection Time: 02/04/21  4:13 AM  Result Value Ref Range   Sodium 139 135 - 145 mmol/L   Potassium 3.7 3.5 - 5.1 mmol/L   Chloride 109 98 - 111 mmol/L   CO2 23 22 - 32 mmol/L   Glucose, Bld 94 70 - 99 mg/dL   BUN 16 8 - 23 mg/dL   Creatinine, Ser 0.74 0.61 - 1.24 mg/dL   Calcium 8.1 (L) 8.9 - 10.3 mg/dL   GFR,  Estimated >60 >60 mL/min   Anion gap 7 5 - 15   *Note: Due to a large number of results and/or encounters for the requested time period, some results have not been displayed. A complete set of results can be found in Results Review.    No results found.  Assessment/Plan: 6 Days Post-Op   Principal Problem:   Closed left hip fracture, initial encounter ([place]) Active Problems:   Hx of CABG   Essential hypertension   OSA on CPAP   PAF (paroxysmal atrial fibrillation) (HCC) CHA2DS2-VASc = 4. AC = Eliquis   COPD (chronic obstructive pulmonary disease) (HCC)   Accidental fall   Closed fracture of left distal radius   Dementia without behavioral disturbance (HCC)   Pubic ramus fracture, left, closed, initial encounter ([place])   Hip fracture ([place])   Protein-calorie malnutrition, severe  The patient is being discharged to rehab today.  He will remain nonweightbearing on the left wrist and must use a platform walker for assistance with ambulation.  He is weightbearing as tolerated on  the left lower extremity.  Patient should continue Lovenox 40 mg daily for DVT prophylaxis while at rehab.  She will follow-up in our office in 10 to 14 days for wound check, staple removal and x-ray.    Thornton Park , MD 02/04/2021, 2:25 PM

## 2021-02-04 NOTE — Care Management Important Message (Signed)
Important Message  Patient Details  Name: [name] MRN: [id number] Date of Birth: 1933-11-19   Medicare Important Message Given:  Yes  I contacted son, Dilraj Killgore by phone [phone]) and reviewed the Important Message from Medicare with him.  He is in agreement with the discharge plan.  I asked if he would like a copy and he replied, yes.  Sent via secure e-mail to: [email or web address] and thanked him for his time.  Juliann Pulse A Eugine Bubb 02/04/2021, 1:25 PM

## 2021-02-04 NOTE — Progress Notes (Signed)
Patient discharged from the unit to facility, transported via stretcher by EMS, report given to EMS, patient alert, vitals stable, nil complaints voiced.

## 2021-02-07 LAB — CULTURE, BLOOD (ROUTINE X 2)
Culture: NO GROWTH
Culture: NO GROWTH
Special Requests: ADEQUATE
Special Requests: ADEQUATE

## 2021-03-01 ENCOUNTER — Other Ambulatory Visit: Payer: Self-pay

## 2021-03-01 ENCOUNTER — Inpatient Hospital Stay
Admission: EM | Admit: 2021-03-01 | Discharge: 2021-03-05 | DRG: 689 | Disposition: A | Discharge status: Home Health | Payer: Medicare Other | Attending: Internal Medicine | Admitting: Internal Medicine

## 2021-03-01 DIAGNOSIS — F0390 Unspecified dementia without behavioral disturbance: Secondary | ICD-10-CM | POA: Diagnosis present

## 2021-03-01 DIAGNOSIS — F418 Other specified anxiety disorders: Secondary | ICD-10-CM

## 2021-03-01 DIAGNOSIS — F0280 Dementia in other diseases classified elsewhere without behavioral disturbance: Secondary | ICD-10-CM | POA: Diagnosis present

## 2021-03-01 DIAGNOSIS — M810 Age-related osteoporosis without current pathological fracture: Secondary | ICD-10-CM | POA: Diagnosis present

## 2021-03-01 DIAGNOSIS — H04123 Dry eye syndrome of bilateral lacrimal glands: Secondary | ICD-10-CM | POA: Diagnosis present

## 2021-03-01 DIAGNOSIS — E43 Unspecified severe protein-calorie malnutrition: Secondary | ICD-10-CM | POA: Diagnosis present

## 2021-03-01 DIAGNOSIS — G3183 Dementia with Lewy bodies: Secondary | ICD-10-CM | POA: Diagnosis present

## 2021-03-01 DIAGNOSIS — Z9989 Dependence on other enabling machines and devices: Secondary | ICD-10-CM

## 2021-03-01 DIAGNOSIS — Z8781 Personal history of (healed) traumatic fracture: Secondary | ICD-10-CM

## 2021-03-01 DIAGNOSIS — J309 Allergic rhinitis, unspecified: Secondary | ICD-10-CM | POA: Diagnosis present

## 2021-03-01 DIAGNOSIS — F32A Depression, unspecified: Secondary | ICD-10-CM | POA: Diagnosis present

## 2021-03-01 DIAGNOSIS — Z885 Allergy status to narcotic agent status: Secondary | ICD-10-CM

## 2021-03-01 DIAGNOSIS — I10 Essential (primary) hypertension: Secondary | ICD-10-CM | POA: Diagnosis present

## 2021-03-01 DIAGNOSIS — K219 Gastro-esophageal reflux disease without esophagitis: Secondary | ICD-10-CM | POA: Diagnosis present

## 2021-03-01 DIAGNOSIS — Z888 Allergy status to other drugs, medicaments and biological substances status: Secondary | ICD-10-CM

## 2021-03-01 DIAGNOSIS — J449 Chronic obstructive pulmonary disease, unspecified: Secondary | ICD-10-CM | POA: Diagnosis present

## 2021-03-01 DIAGNOSIS — R319 Hematuria, unspecified: Secondary | ICD-10-CM

## 2021-03-01 DIAGNOSIS — F419 Anxiety disorder, unspecified: Secondary | ICD-10-CM | POA: Diagnosis present

## 2021-03-01 DIAGNOSIS — N390 Urinary tract infection, site not specified: Secondary | ICD-10-CM | POA: Diagnosis not present

## 2021-03-01 DIAGNOSIS — R310 Gross hematuria: Secondary | ICD-10-CM

## 2021-03-01 DIAGNOSIS — I480 Paroxysmal atrial fibrillation: Secondary | ICD-10-CM | POA: Diagnosis present

## 2021-03-01 DIAGNOSIS — N3001 Acute cystitis with hematuria: Principal | ICD-10-CM

## 2021-03-01 DIAGNOSIS — Z951 Presence of aortocoronary bypass graft: Secondary | ICD-10-CM

## 2021-03-01 DIAGNOSIS — Z9889 Other specified postprocedural states: Secondary | ICD-10-CM

## 2021-03-01 DIAGNOSIS — G4733 Obstructive sleep apnea (adult) (pediatric): Secondary | ICD-10-CM

## 2021-03-01 DIAGNOSIS — Z1611 Resistance to penicillins: Secondary | ICD-10-CM | POA: Diagnosis present

## 2021-03-01 DIAGNOSIS — G9341 Metabolic encephalopathy: Secondary | ICD-10-CM

## 2021-03-01 DIAGNOSIS — E785 Hyperlipidemia, unspecified: Secondary | ICD-10-CM | POA: Diagnosis present

## 2021-03-01 DIAGNOSIS — Z8546 Personal history of malignant neoplasm of prostate: Secondary | ICD-10-CM

## 2021-03-01 DIAGNOSIS — E039 Hypothyroidism, unspecified: Secondary | ICD-10-CM | POA: Diagnosis present

## 2021-03-01 DIAGNOSIS — E876 Hypokalemia: Secondary | ICD-10-CM | POA: Diagnosis present

## 2021-03-01 DIAGNOSIS — Z66 Do not resuscitate: Secondary | ICD-10-CM | POA: Diagnosis present

## 2021-03-01 DIAGNOSIS — Z87891 Personal history of nicotine dependence: Secondary | ICD-10-CM

## 2021-03-01 DIAGNOSIS — Z85038 Personal history of other malignant neoplasm of large intestine: Secondary | ICD-10-CM

## 2021-03-01 DIAGNOSIS — Z85828 Personal history of other malignant neoplasm of skin: Secondary | ICD-10-CM

## 2021-03-01 DIAGNOSIS — B9620 Unspecified Escherichia coli [E. coli] as the cause of diseases classified elsewhere: Secondary | ICD-10-CM | POA: Diagnosis present

## 2021-03-01 DIAGNOSIS — Z681 Body mass index (BMI) 19 or less, adult: Secondary | ICD-10-CM

## 2021-03-01 DIAGNOSIS — C61 Malignant neoplasm of prostate: Secondary | ICD-10-CM | POA: Diagnosis present

## 2021-03-01 DIAGNOSIS — H9190 Unspecified hearing loss, unspecified ear: Secondary | ICD-10-CM | POA: Diagnosis present

## 2021-03-01 DIAGNOSIS — H3530 Unspecified macular degeneration: Secondary | ICD-10-CM | POA: Diagnosis present

## 2021-03-01 DIAGNOSIS — Z79899 Other long term (current) drug therapy: Secondary | ICD-10-CM

## 2021-03-01 DIAGNOSIS — Z20822 Contact with and (suspected) exposure to covid-19: Secondary | ICD-10-CM | POA: Diagnosis present

## 2021-03-01 DIAGNOSIS — Z7901 Long term (current) use of anticoagulants: Secondary | ICD-10-CM

## 2021-03-01 DIAGNOSIS — I2510 Atherosclerotic heart disease of native coronary artery without angina pectoris: Secondary | ICD-10-CM | POA: Diagnosis present

## 2021-03-01 DIAGNOSIS — D649 Anemia, unspecified: Secondary | ICD-10-CM | POA: Diagnosis present

## 2021-03-01 LAB — CBC WITH DIFFERENTIAL/PLATELET
Abs Immature Granulocytes: 0.05 10*3/uL (ref 0.00–0.07)
Basophils Absolute: 0.1 10*3/uL (ref 0.0–0.1)
Basophils Relative: 1 %
Eosinophils Absolute: 0.1 10*3/uL (ref 0.0–0.5)
Eosinophils Relative: 1 %
HCT: 27.9 % — ABNORMAL LOW (ref 39.0–52.0)
Hemoglobin: 8.9 g/dL — ABNORMAL LOW (ref 13.0–17.0)
Immature Granulocytes: 1 %
Lymphocytes Relative: 19 %
Lymphs Abs: 1.6 10*3/uL (ref 0.7–4.0)
MCH: 31.3 pg (ref 26.0–34.0)
MCHC: 31.9 g/dL (ref 30.0–36.0)
MCV: 98.2 fL (ref 80.0–100.0)
Monocytes Absolute: 1.2 10*3/uL — ABNORMAL HIGH (ref 0.1–1.0)
Monocytes Relative: 14 %
Neutro Abs: 5.3 10*3/uL (ref 1.7–7.7)
Neutrophils Relative %: 64 %
Platelets: 181 10*3/uL (ref 150–400)
RBC: 2.84 MIL/uL — ABNORMAL LOW (ref 4.22–5.81)
RDW: 15.6 % — ABNORMAL HIGH (ref 11.5–15.5)
WBC: 8.3 10*3/uL (ref 4.0–10.5)
nRBC: 0 % (ref 0.0–0.2)

## 2021-03-01 LAB — RESP PANEL BY RT-PCR (FLU A&B, COVID) ARPGX2
Influenza A by PCR: NEGATIVE
Influenza B by PCR: NEGATIVE
SARS Coronavirus 2 by RT PCR: NEGATIVE

## 2021-03-01 LAB — URINALYSIS, COMPLETE (UACMP) WITH MICROSCOPIC
RBC / HPF: >50 RBC/hpf — ABNORMAL HIGH (ref 0–5)
Specific Gravity, Urine: 1.025 (ref 1.005–1.030)
Squamous Epithelial / HPF: NONE SEEN (ref 0–5)
WBC, UA: >50 WBC/hpf — ABNORMAL HIGH (ref 0–5)

## 2021-03-01 LAB — BASIC METABOLIC PANEL
Anion gap: 3 — ABNORMAL LOW (ref 5–15)
BUN: 16 mg/dL (ref 8–23)
CO2: 29 mmol/L (ref 22–32)
Calcium: 8.6 mg/dL — ABNORMAL LOW (ref 8.9–10.3)
Chloride: 104 mmol/L (ref 98–111)
Creatinine, Ser: 0.81 mg/dL (ref 0.61–1.24)
GFR, Estimated: >60 mL/min (ref >60)
Glucose, Bld: 106 mg/dL — ABNORMAL HIGH (ref 70–99)
Potassium: 4.1 mmol/L (ref 3.5–5.1)
Sodium: 136 mmol/L (ref 135–145)

## 2021-03-01 LAB — TROPONIN I (HIGH SENSITIVITY): Troponin I (High Sensitivity): 13 ng/L (ref <18)

## 2021-03-01 MED ORDER — HYDRALAZINE HCL 20 MG/ML IJ SOLN: 5.0000 mg | INTRAMUSCULAR | Status: DC | PRN

## 2021-03-01 MED ORDER — VITAMIN D 25 MCG (1000 UNIT) PO TABS
2000.0000 [IU] | ORAL_TABLET | Freq: Every day | ORAL | Status: DC
  Administered 2021-03-02 – 2021-03-05 (×4): 2000 [IU] via ORAL
  Filled 2021-03-01 (×4): qty 2

## 2021-03-01 MED ORDER — LACTATED RINGERS IV BOLUS
1000.0000 mL | Freq: Once | INTRAVENOUS | Status: AC
  Administered 2021-03-01: 1000 mL via INTRAVENOUS

## 2021-03-01 MED ORDER — MIRTAZAPINE 15 MG PO TABS
7.5000 mg | ORAL_TABLET | Freq: Every day | ORAL | Status: DC
  Administered 2021-03-02 – 2021-03-04 (×3): 7.5 mg via ORAL
  Filled 2021-03-01 (×3): qty 1

## 2021-03-01 MED ORDER — EZETIMIBE 10 MG PO TABS
10.0000 mg | ORAL_TABLET | Freq: Every day | ORAL | Status: DC
  Administered 2021-03-02 – 2021-03-05 (×4): 10 mg via ORAL
  Filled 2021-03-01 (×4): qty 1

## 2021-03-01 MED ORDER — TRAZODONE HCL 50 MG PO TABS
50.0000 mg | ORAL_TABLET | Freq: Every day | ORAL | Status: DC
  Administered 2021-03-02 – 2021-03-04 (×3): 50 mg via ORAL
  Filled 2021-03-01 (×3): qty 1

## 2021-03-01 MED ORDER — MAGNESIUM OXIDE -MG SUPPLEMENT 400 (240 MG) MG PO TABS
400.0000 mg | ORAL_TABLET | Freq: Every evening | ORAL | Status: DC
  Administered 2021-03-03 – 2021-03-04 (×2): 400 mg via ORAL
  Filled 2021-03-01 (×2): qty 1

## 2021-03-01 MED ORDER — ONDANSETRON HCL 4 MG/2ML IJ SOLN: 4.0000 mg | Freq: Three times a day (TID) | INTRAMUSCULAR | Status: DC | PRN

## 2021-03-01 MED ORDER — FLUTICASONE PROPIONATE 50 MCG/ACT NA SUSP
1.0000 | Freq: Every day | NASAL | Status: DC | PRN
  Filled 2021-03-01: qty 16

## 2021-03-01 MED ORDER — LORAZEPAM 0.5 MG PO TABS: 0.5000 mg | ORAL_TABLET | Freq: Three times a day (TID) | ORAL | Status: DC | PRN

## 2021-03-01 MED ORDER — DM-GUAIFENESIN ER 30-600 MG PO TB12
1.0000 | ORAL_TABLET | Freq: Two times a day (BID) | ORAL | Status: DC
  Administered 2021-03-02 – 2021-03-05 (×7): 1 via ORAL
  Filled 2021-03-01 (×9): qty 1

## 2021-03-01 MED ORDER — METOPROLOL TARTRATE 25 MG PO TABS
25.0000 mg | ORAL_TABLET | Freq: Two times a day (BID) | ORAL | Status: DC
  Administered 2021-03-02 – 2021-03-04 (×4): 25 mg via ORAL
  Filled 2021-03-01 (×7): qty 1

## 2021-03-01 MED ORDER — LISINOPRIL 5 MG PO TABS
2.5000 mg | ORAL_TABLET | Freq: Two times a day (BID) | ORAL | Status: DC
  Administered 2021-03-02 – 2021-03-04 (×4): 2.5 mg via ORAL
  Filled 2021-03-01 (×7): qty 1

## 2021-03-01 MED ORDER — DONEPEZIL HCL 5 MG PO TABS
10.0000 mg | ORAL_TABLET | Freq: Every day | ORAL | Status: DC
  Administered 2021-03-02 – 2021-03-04 (×3): 10 mg via ORAL
  Filled 2021-03-01 (×3): qty 2

## 2021-03-01 MED ORDER — BACITRACIN ZINC 500 UNIT/GM EX OINT
1.0000 "application " | TOPICAL_OINTMENT | Freq: Every evening | CUTANEOUS | Status: DC
  Administered 2021-03-03 – 2021-03-04 (×2): 1 via TOPICAL
  Filled 2021-03-01: qty 0.9

## 2021-03-01 MED ORDER — PRO-STAT SUGAR FREE PO LIQD
30.0000 mL | Freq: Two times a day (BID) | ORAL | Status: DC
  Administered 2021-03-03 – 2021-03-04 (×2): 30 mL via ORAL

## 2021-03-01 MED ORDER — MAGIC MOUTHWASH
30.0000 mL | Freq: Four times a day (QID) | ORAL | Status: DC
  Filled 2021-03-01 (×3): qty 30

## 2021-03-01 MED ORDER — FINASTERIDE 5 MG PO TABS
5.0000 mg | ORAL_TABLET | Freq: Every day | ORAL | Status: DC
  Administered 2021-03-02 – 2021-03-05 (×4): 5 mg via ORAL
  Filled 2021-03-01 (×4): qty 1

## 2021-03-01 MED ORDER — ACETAMINOPHEN 325 MG PO TABS: 650.0000 mg | ORAL_TABLET | Freq: Four times a day (QID) | ORAL | Status: DC | PRN

## 2021-03-01 MED ORDER — SODIUM CHLORIDE 0.9 % IV SOLN
1.0000 g | Freq: Once | INTRAVENOUS | Status: AC
  Administered 2021-03-01: 1 g via INTRAVENOUS
  Filled 2021-03-01: qty 10

## 2021-03-01 MED ORDER — ALBUTEROL SULFATE HFA 108 (90 BASE) MCG/ACT IN AERS
2.0000 | INHALATION_SPRAY | RESPIRATORY_TRACT | Status: DC | PRN
  Filled 2021-03-01: qty 6.7

## 2021-03-01 MED ORDER — ADULT MULTIVITAMIN W/MINERALS CH
1.0000 | ORAL_TABLET | Freq: Every day | ORAL | Status: DC
  Administered 2021-03-02 – 2021-03-05 (×4): 1 via ORAL
  Filled 2021-03-01 (×4): qty 1

## 2021-03-01 MED ORDER — POLYVINYL ALCOHOL 1.4 % OP SOLN
1.0000 [drp] | Freq: Every day | OPHTHALMIC | Status: DC
  Administered 2021-03-03 – 2021-03-04 (×2): 1 [drp] via OPHTHALMIC
  Filled 2021-03-01 (×2): qty 15

## 2021-03-01 MED ORDER — MOMETASONE FURO-FORMOTEROL FUM 200-5 MCG/ACT IN AERO
2.0000 | INHALATION_SPRAY | Freq: Two times a day (BID) | RESPIRATORY_TRACT | Status: DC
  Administered 2021-03-02 – 2021-03-05 (×7): 2 via RESPIRATORY_TRACT
  Filled 2021-03-01: qty 8.8

## 2021-03-01 MED ORDER — SODIUM CHLORIDE 0.9 % IV SOLN
1.0000 g | INTRAVENOUS | Status: AC
  Administered 2021-03-02 – 2021-03-04 (×3): 1 g via INTRAVENOUS
  Filled 2021-03-01: qty 1
  Filled 2021-03-01 (×2): qty 10

## 2021-03-01 MED ORDER — MIRABEGRON ER 50 MG PO TB24
50.0000 mg | ORAL_TABLET | Freq: Every day | ORAL | Status: DC
  Administered 2021-03-02 – 2021-03-05 (×4): 50 mg via ORAL
  Filled 2021-03-01 (×4): qty 1

## 2021-03-01 MED ORDER — ASCORBIC ACID 500 MG PO TABS
500.0000 mg | ORAL_TABLET | Freq: Two times a day (BID) | ORAL | Status: DC
  Administered 2021-03-02 – 2021-03-05 (×7): 500 mg via ORAL
  Filled 2021-03-01 (×7): qty 1

## 2021-03-01 MED ORDER — BENZOCAINE 10 % MT GEL
1.0000 "application " | Freq: Four times a day (QID) | OROMUCOSAL | Status: DC
  Administered 2021-03-02 – 2021-03-03 (×3): 1 via OROMUCOSAL
  Filled 2021-03-01 (×2): qty 9

## 2021-03-01 MED ORDER — ROSUVASTATIN CALCIUM 10 MG PO TABS
20.0000 mg | ORAL_TABLET | Freq: Every day | ORAL | Status: DC
  Administered 2021-03-02 – 2021-03-05 (×4): 20 mg via ORAL
  Filled 2021-03-01 (×2): qty 2
  Filled 2021-03-01: qty 1
  Filled 2021-03-01: qty 2

## 2021-03-01 NOTE — ED Notes (Signed)
Patient is resting comfortably.  Respirations even and unlabored.  No acute distress noted.

## 2021-03-01 NOTE — ED Notes (Addendum)
Son at bedside. [facility] to [facility]. But staff decided to have him evaluated prior to being sent home for blood in urine. Son advised he has been placed on a new antibiotic but he is unsure of the name and it is not listed on [facility] sent by [facility].   Son [name] [phone]

## 2021-03-01 NOTE — ED Notes (Signed)
Patient failed swallow study.  Notified Dr. Sidney Ace.

## 2021-03-01 NOTE — H&P (Signed)
History and Physical    [name]:[id number] DOB: May 25, 1934 DOA: 03/01/2021  Referring MD/NP/PA:   PCP: Monico Blitz, NP   Patient coming from:  The patient is coming from SNF.    Chief Complaint: Dysuria, burning on urination, hematuria  HPI: [name] is a 87 y.o. male with medical history significant of hypertension, hyperlipidemia, COPD, GERD, hypothyroidism, depression, anxiety, atrial fibrillation on Eliquis, OSA on CPAP, prostate cancer, Lewy body dementia, CAD, CABG, adenocarcinoma of the sigmoid colon, AAA (s/p of repair), anemia, who presents with dysuria, burning on urination.  Patient was recently hospitalized from 5/26-6/6 due to closed left hip fracture, initial encounter and closed fracture of left distal radius. Pt was discharged to SNF for further rehab after surgery.  Per his son, patient has been good healing process regarding the recent surgery.  Patient developed dysuria, burning on urination, increased urinary frequency and hematuria.  He was started on Macrobid on 6/29 for UTI in facility.  Patient continues to have symptoms of UTI, including dysuria, burning on urination, hematuria and increased urinary frequency.  No fever or chills.  Patient does not have chest pain, cough, shortness of breath.  No nausea vomiting, diarrhea or abdominal pain. Per his son, patient's mental status close to baseline.  His son does not think CT of head is necessary at this moment.   ED Course: pt was found to have WBC 8.3, troponin level 13, negative COVID PCR, urinalysis (turbid appearance, many bacteria, WBC> 50), renal function okay, temperature normal, blood pressure 117/83, heart rate 90, RR 14, oxygen saturation 93-100% on room air.  Patient is placed on MedSurg bed follow-up physician.  Review of Systems:   General: no fevers, chills, no body weight gain, has poor appetite, has fatigue HEENT: no blurry vision, hearing changes or sore throat Respiratory: no  dyspnea, coughing, wheezing CV: no chest pain, no palpitations GI: no nausea, vomiting, abdominal pain, diarrhea, constipation GU: has dysuria, burning on urination, increased urinary frequency, hematuria  Ext: no leg edema Neuro: no unilateral weakness, numbness, or tingling, no vision change or hearing loss Skin: no rash, no skin tear. MSK: s/p of surgery in left hip and left distal radius. Still has splint on in left arm. No significant pain Heme: No easy bruising.  Travel history: No recent long distant travel.  Allergy:  Allergies  Allergen Reactions   Clonazepam Other (See Comments)    Altered mental status   Codeine Other (See Comments)    Altered mental status.   Ropinirole Swelling    Other reaction(s): Hallucination   Hydrocodone     confusion   Niacin And Related Other (See Comments)    Flushing of skin   Oxycodone Other (See Comments)    confusion confusion   Tramadol     Other reaction(s): Hallucination    Past Medical History:  Diagnosis Date   AAA (abdominal aortic aneurysm) ([place]) 05/2019   Relativley Rapid progression from June-Aug 2020 (4.6 - 5.4 cm): s/p EVAR (Dr. Lucky Cowboy [facility]): EVAR - 23 mm prox, 12 cm distal & x 18 cm length Gore Excluder Endoprosthesis Main body (via RFA distal to lowest Renal A); 60m x 14 cm L Contralateral Limb for L Iliac - extended with 8 mm x 6cm LifeStar stent (post-dilated with 7 mm DEB).  Additional R Iliac PTA not required.    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 09/15[place]   09-15-2017 DIAGNOSIS:  A. COLON POLYP X 2, ASCENDING; COLD SNARE:  -  TUBULAR ADENOMA.  - NEGATIVE FOR HIGH-GRADE DYSPLASIA AND MALIGNANCY.  - NO ADDITIONAL TISSUE IDENTIFIED; FECAL MATERIAL PRESENT.   B. COLON POLYP X 2, DESCENDING; COLD SNARE:  - ADENOCARCINOMA ARISING IN A DYSPLASTIC SESSILE SERRATED ADENOMA, WITH  LYMPHOVASCULAR INVASION, SEE COMMENT.  - TUBULAR ADENOMA, 2 FRAGMENTS, NEGATIVE FO   Arthritis    Basal cell carcinoma of eyelid 01/03/2013   2019  - R side of Nose   Benign neoplasm of ascending colon    Benign neoplasm of descending colon    Bilateral iliac artery stenosis ([place]) 2007   Bilateral Iliac A stenting; extension of EVAR limbs into both Iliacs -- additional stent placed in L Iliac.   Cancer [facility])    Skin cancer   Colon polyps    Complication of anesthesia    severe confusion agitation requiring hospital admission x 4 days   Compression fracture of fourth lumbar vertebra ([place]) 04/2019   - s/p Kyphoplasty; T12, L3-5 (*[facility])   COPD (chronic obstructive pulmonary disease) (HCC)    Centrilobular Emphysema   Coronary artery disease involving native coronary artery without angina pectoris 1998   - s/p CABG, occluded SVG-D1; patent LIMA-LAD, SVG-OM, SVG- RCA   Dementia ([place])    memory loss - started noting late 2019   Falls frequently    broke foot   Fracture 2021   left foot   GERD (gastroesophageal reflux disease)    Hemorrhoid 02/10/2015   History of hiatal hernia    History of kidney stones    HOH (hard of hearing)    Hypertension    Lewy body dementia ([place]) 04/04/2020   Macular degeneration disease    Osteoporosis    PAF (paroxysmal atrial fibrillation) (HCC)    on Eliquis for OAC - Rate Control    Prostate cancer (HCC)    Prostate disorder    Sciatic pain    Chronic   Sleep apnea    cpap   Temporary cerebral vascular dysfunction 02/10/2015   Had negative work-up.  July, 2012.  No medication changes, had forgot them that day, got dehydrated.    Urinary incontinence     Past Surgical History:  Procedure Laterality Date   ABDOMINAL AORTIC ENDOVASCULAR STENT GRAFT Bilateral 05/18/2019   Procedure: ABDOMINAL AORTIC ENDOVASCULAR STENT GRAFT;  Surgeon: Algernon Huxley, MD;  Location: [facility] ORS;  Service: Vascular;  Laterality: Bilateral;   ANKLE SURGERY Left    ORIF    Arch aortogram and carotid aortogram  06/02/2006   Dr Oneida Alar did surgery   BASAL CELL CARCINOMA EXCISION  04/2016   Dermatology   CARDIAC  CATHETERIZATION  01/30/1998    (Dr. Linard Millers): Native LAD & mRCA CTO. 90% D1. Mod Cx. SVG-D1 CTO. SVG-RCA & SVG-OM along with LIMA-LAD patent;  LV NORMAL Fxn   CAROTID ENDARTERECTOMY Left Oct. 29, 2007   Dr. Oneida Alar:   CATARACT EXTRACTION W/PHACO Right 03/05/2017   Procedure: CATARACT EXTRACTION PHACO AND INTRAOCULAR LENS PLACEMENT ([place]);  Surgeon: Leandrew Koyanagi, MD;  Location: [facility] ORS;  Service: Ophthalmology;  Laterality: Right;  [place] 00:58.5 AP% 10.6 CDE 6.20 Fluid Pack lot # [id number]   CATARACT EXTRACTION W/PHACO Left 04/15/2017   Procedure: CATARACT EXTRACTION PHACO AND INTRAOCULAR LENS PLACEMENT ([place])  Left;  Surgeon: Leandrew Koyanagi, MD;  Location: [place];  Service: Ophthalmology;  Laterality: Left;   COLONOSCOPY WITH PROPOFOL N/A 09/15/2017   Procedure: COLONOSCOPY WITH PROPOFOL;  Surgeon: Lucilla Lame, MD;  Location: [facility] ENDOSCOPY;  Service: Endoscopy;  Laterality:  N/A;   COLONOSCOPY WITH PROPOFOL N/A 10/26/2018   Procedure: COLONOSCOPY WITH PROPOFOL;  Surgeon: Lucilla Lame, MD;  Location: [facility] END[facility]COPY;  Service: Endoscopy;  Laterality: N/A;   CORONARY ARTERY BYPASS GRAFT  1998   LIMA-LAD, SVG-OM, SVG-RPDA, SVG-DIAG   CPET / MET  12/2012   Mild chronotropic incompetence - read 82% of predicted; also reduced effort; peak VO2 15.7 / 75% (did not reach Max effort) -- suggested ischemic response in last 1.5 minutes of exercise. Normal pulmonary function on PFTs but poor response to   CYSTOSCOPY W/ RETROGRADES Left 01/13/2020   Procedure: CYSTOSCOPY WITH RETROGRADE PYELOGRAM;  Surgeon: Billey Co, MD;  Location: [facility] ORS;  Service: Urology;  Laterality: Left;   CYSTOSCOPY WITH STENT PLACEMENT Left 12/26/2019   Procedure: CYSTOSCOPY WITH STENT PLACEMENT;  Surgeon: Billey Co, MD;  Location: [facility] ORS;  Service: Urology;  Laterality: Left;   CYSTOSCOPY/URETEROSCOPY/HOLMIUM LASER/STENT PLACEMENT Left 01/13/2020   Procedure: CYSTOSCOPY/URETEROSCOPY/HOLMIUM  LASER/STENT EXCHANGE;  Surgeon: Billey Co, MD;  Location: [facility] ORS;  Service: Urology;  Laterality: Left;   ENDOVASCULAR REPAIR/STENT GRAFT N/A 05/18/2019   Procedure: ENDOVASCULAR REPAIR/STENT GRAFT;  Surgeon: Algernon Huxley, MD;  [facility] INVASIVE CV LAB;; EVAR - 23 mm prox, 12 cm distal & x 18 cm length Gore Excluder Endoprosthesis Main body (via RFA distal to lowest Renal A); 64m x 14 cm L Contralateral Limb for L Iliac - extended with 8 mm x 6cm LifeStar stent; no additional R Iliac PTA needed.    ESOPHAGOGASTRODUODENOSCOPY (EGD) WITH PROPOFOL N/A 09/15/2017   Procedure: ESOPHAGOGASTRODUODENOSCOPY (EGD) WITH PROPOFOL;  Surgeon: WLucilla Lame MD;  [facility];  Service: Endoscopy;  Laterality: N/A;   FRACTURE SURGERY Right 03/19/2015   wrist   FRACTURE SURGERY Left    HIATAL HERNIA REPAIR     ILIAC ARTERY STENT  12/15/2005   PTA and direct stenting rgt and lft common iliac arteries   INCISION AND DRAINAGE OF WOUND Left 01/29/2021   Procedure: IRRIGATION AND DEBRIDEMENT Left Wrist and Removal of Plate;  Surgeon: KThornton Park MD;  Location: [facility] ORS;  Service: Orthopedics;  Laterality: Left;   INTRAMEDULLARY (IM) NAIL INTERTROCHANTERIC Left 01/29/2021   Procedure: INTRAMEDULLARY (IM) NAIL INTERTROCHANTRIC;  Surgeon: KThornton Park MD;  Location: [facility] OR[facility];  Service: Orthopedics;  Laterality: Left;   KYPHOPLASTY N/A 04/05/2019   Procedure: KYPHOPLASTY T12, L3, L4 L5;  Surgeon: MHessie Knows MD;  Location: [facility];  Service: Orthopedics;  Laterality: N/A;   NM GATED MYOVIEW ([place])  05/'19; 8/'20   a) [facility]: Low Risk - no ischemia or infarct.  EF > 65%. no RWMA;; b) ([facility]: Normal wall motion.  No ischemia or infarction.   OPEN REDUCTION INTERNAL FIXATION (ORIF) DISTAL RADIAL FRACTURE Left 01/13/2019   Procedure: OPEN REDUCTION INTERNAL FIXATION (ORIF) DISTAL  RADIAL FRACTURE - LEFT - SLEEP APNEA;  Surgeon: MHessie Knows MD;  Location: [facility];  Service:  Orthopedics;  Laterality: Left;   OPEN REDUCTION INTERNAL FIXATION (ORIF) DISTAL RADIAL FRACTURE Left 01/29/2021   Procedure: OPEN REDUCTION INTERNAL FIXATION (ORIF) DISTAL RADIAL FRACTURE;  Surgeon: KThornton Park MD;  Location: [facility];  Service: Orthopedics;  Laterality: Left;   ORIF ELBOW FRACTURE Right 01/01/2017   Procedure: OPEN REDUCTION INTERNAL FIXATION (ORIF) ELBOW/OLECRANON FRACTURE;  Surgeon: MHessie Knows MD;  Location: [facility];  Service: Orthopedics;  Laterality: Right;   ORIF WRIST FRACTURE Right 03/19/2015   Procedure: OPEN REDUCTION INTERNAL FIXATION (ORIF) WRIST FRACTURE;  Surgeon: MHessie Knows MD;  Location: [facility];  Service: Orthopedics;  Laterality: Right;   SHOULDER ARTHROSCOPY Right    THORACIC AORTA - CAROTID ANGIOGRAM  October 2007   Dr. 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 Dilated left atrium.   TRANSTHORACIC ECHOCARDIOGRAM  04/2019   [facility] April 20, 2019): EF 55-60 %.  Mild LVH.  Relatively normal valves.    Social History:  reports that he quit smoking about 23 years ago. His smoking use included cigarettes. He has never used smokeless tobacco. He reports that he does not drink alcohol and does not use drugs.  Family History:  Family History  Problem Relation Age of Onset   Ulcers Mother        Peptic   Dementia Mother    Alcohol abuse Father      Prior to Admission medications   Medication Sig Start Date End Date Taking? Authorizing Provider  acetaminophen (TYLENOL) 500 MG tablet Take 500-1,000 mg by mouth every 6 (six) hours as needed for mild pain or moderate pain.    [provider]  bacitracin ([employer] BACITRACIN) ointment Apply 1 application topically every evening. Apply to right eyelid topically for 4 weeks in the evening after instillation of Systane eye drops.     [provider]  Cholecalciferol 50 MCG (2000 UT) TBDP Take 2,000 Units by mouth daily.    [provider]  donepezil (ARICEPT) 10 MG tablet Take 10 mg by mouth at bedtime. 12/26/20   [provider]  ELIQUIS 5 MG TABS tablet TAKE ONE TABLET TWICE DAILY 09/03/20   Leonie Man, MD  ezetimibe (ZETIA) 10 MG tablet TAKE 1 TABLET BY MOUTH DAILY 12/21/20   [name], MD  finasteride (PROSCAR) 5 MG tablet TAKE ONE TABLET BY MOUTH EVERY DAY 12/21/20   Stoioff, Ronda Fairly, MD  fluticasone (FLONASE) 50 MCG/ACT nasal spray PLACE TWO SPRAYS IN BOTH NOSRTILS EVERY DAY AS NEEDED FOR ALLERGIES Patient taking differently: Place 2 sprays into both nostrils daily. 08/07/20   [name], MD  fluticasone-salmeterol [facility] INHUB) 250-50 MCG/ACT AEPB Inhale 1 puff into the lungs in the morning and at bedtime.    [provider]  lisinopril (ZESTRIL) 2.5 MG tablet TAKE ONE TABLET BY MOUTH TWICE DAILY 10/05/20   [name], MD  LORazepam (ATIVAN) 0.5 MG tablet Take 0.5 mg by mouth every 8 (eight) hours as needed for anxiety or agitation. 12/26/20   [provider]  Magnesium Oxide 500 MG TABS Take 500 mg by mouth every evening.    [provider]  metoprolol tartrate (LOPRESSOR) 25 MG tablet Take 25 mg by mouth 2 (two) times daily. 03/30/20   [provider]  mirtazapine (REMERON) 7.5 MG tablet Take 7.5 mg by mouth at bedtime. 12/26/20   [provider]  Multiple Vitamins-Minerals (PRESERVISION AREDS 2 PO) Take 1 tablet by mouth 2 (two) times daily.     [provider]  MYRBETRIQ 50 MG TB24 tablet TAKE ONE TABLET BY MOUTH EVERY DAY 12/21/20   Stoioff, Ronda Fairly, MD  Polyethyl Glycol-Propyl Glycol (SYSTANE) 0.4-0.3 % SOLN Place 1 drop into both eyes at bedtime.    [provider]  rosuvastatin (CRESTOR) 20 MG tablet TAKE ONE TABLET BY MOUTH EVERY DAY Patient taking differently: Take 20 mg by mouth daily. 12/21/20   [name], MD  traZODone (DESYREL) 50 MG tablet Take 50 mg by mouth at bedtime. 12/24/20   [provider]    Physical Exam: Vitals:   03/01/21 1153 03/01/21 1155 03/01/21 1520  BP: (!) 116/57  117/83  Pulse: 87  90  Resp: 16  14  Temp: 97.8 F (36.6 C)    TempSrc: Oral    SpO2: 100%  93%  Weight:  66.2 kg   Height:  6' (1.829 m)    General: Not in acute distress HEENT:       Eyes: PERRL, EOMI, no scleral icterus.       ENT: No discharge from the ears and nose, no pharynx injection, no tonsillar enlargement.        Neck: No JVD, no bruit, no mass felt. Heme: No neck lymph node enlargement. Cardiac: S1/S2, RRR, No murmurs, No gallops or rubs. Respiratory: No rales, wheezing, rhonchi or rubs. GI: Soft, nondistended, nontender, no rebound pain, no organomegaly, BS present. GU: has hematuria Ext: No pitting leg edema bilaterally. 1+DP/PT pulse bilaterally. Musculoskeletal: s/p of surgery in left hip and left distal radius. Still has splint on in left arm. No significant pain. Skin: No rashes.  Neuro: Alert, cranial nerves II-XII grossly intact, moves all extremities normally.  Psych: Patient is not psychotic, no suicidal or hemocidal ideation.  Labs on Admission: I have personally reviewed following labs and imaging studies  CBC: Recent Labs  Lab 03/01/21 1211  WBC 8.3  NEUTROABS 5.3  HGB 8.9*  HCT 27.9*  MCV 98.2  PLT 138   Basic Metabolic Panel: Recent Labs  Lab 03/01/21 1211  NA 136  K 4.1  CL 104  CO2 29  GLUCOSE 106*  BUN 16  CREATININE 0.81  CALCIUM 8.6*   GFR: Estimated Creatinine Clearance: 62.4 mL/min (by C-G formula based on SCr of 0.81 mg/dL). Liver Function Tests: No results for input(s): AST, ALT, ALKPHOS, BILITOT, PROT, ALBUMIN in the last 168 hours. No results for input(s): LIPASE, AMYLASE in the last 168 hours. No results for input(s): AMMONIA in the last 168 hours. Coagulation Profile: No results for input(s): INR, PROTIME in the  last 168 hours. Cardiac Enzymes: No results for input(s): CKTOTAL, CKMB, CKMBINDEX, TROPONINI in the last 168 hours. BNP (last 3 results) No results for input(s): PROBNP in the last 8760 hours. HbA1C: No results for input(s): HGBA1C in the last 72 hours. CBG: No results for input(s): GLUCAP in the last 168 hours. Lipid Profile: No results for input(s): CHOL, HDL, LDLCALC, TRIG, CHOLHDL, LDLDIRECT in the last 72 hours. Thyroid Function Tests: No results for input(s): TSH, T4TOTAL, FREET4, T3FREE, THYROIDAB in the last 72 hours. Anemia Panel: No results for input(s): VITAMINB12, FOLATE, FERRITIN, TIBC, IRON, RETICCTPCT in the last 72 hours. Urine analysis:    Component Value Date/Time   COLORURINE RED (A) 03/01/2021 1400   APPEARANCEUR TURBID (A) 03/01/2021 1400   APPEARANCEUR Cloudy (A) 01/25/2020 1009   LABSPEC 1.025 03/01/2021 1400   LABSPEC 1.011 10/01/2012 0420   PHURINE  03/01/2021 1400    TEST NOT REPORTED DUE TO COLOR INTERFERENCE OF URINE PIGMENT   GLUCOSEU (A) 03/01/2021 1400    TEST NOT REPORTED DUE TO COLOR INTERFERENCE OF URINE PIGMENT   GLUCOSEU Negative 10/01/2012 0420   HGBUR (A) 03/01/2021 1400    TEST NOT REPORTED DUE TO COLOR INTERFERENCE OF URINE PIGMENT   BILIRUBINUR (A) 03/01/2021 1400    TEST NOT REPORTED DUE TO COLOR INTERFERENCE OF URINE PIGMENT   BILIRUBINUR Negative 11/07/2020  [place] Negative 01/25/2020 1009   BILIRUBINUR Negative 10/01/2012 0420   KETONESUR (A) 03/01/2021 1400    TEST NOT REPORTED DUE TO COLOR INTERFERENCE OF URINE PIGMENT   PROTEINUR (A) 03/01/2021 1400    TEST NOT REPORTED DUE TO COLOR INTERFERENCE OF URINE PIGMENT   UROBILINOGEN 0.2 11/07/2020 1115   NITRITE (A) 03/01/2021 1400    TEST NOT REPORTED DUE TO COLOR INTERFERENCE OF URINE PIGMENT   LEUKOCYTESUR (A) 03/01/2021 1400    TEST NOT REPORTED DUE TO COLOR INTERFERENCE OF URINE PIGMENT   LEUKOCYTESUR Negative 10/01/2012 0420   Sepsis  Labs: '@LABRCNTIP' (procalcitonin:4,lacticidven:4) ) Recent Results (from the past 240 hour(s))  Resp Panel by RT-PCR (Flu A&B, Covid) Nasopharyngeal Swab     Status: None   Collection Time: 03/01/21 11:41 AM   Specimen: Nasopharyngeal Swab; Nasopharyngeal(NP) swabs in vial transport medium  Result Value Ref Range Status   SARS Coronavirus 2 by RT PCR NEGATIVE NEGATIVE Final    Comment: (NOTE) SARS-CoV-2 target nucleic acids are NOT DETECTED.  The SARS-CoV-2 RNA is generally detectable in upper respiratory specimens during the acute phase of infection. The lowest concentration of SARS-CoV-2 viral copies this assay can detect is 138 copies/mL. A negative result does not preclude SARS-Cov-2 infection and should not be used as the sole basis for treatment or other patient management decisions. A negative result may occur with  improper specimen collection/handling, submission of specimen other than nasopharyngeal swab, presence of viral mutation(s) within the areas targeted by this assay, and inadequate number of viral copies(<138 copies/mL). A negative result must be combined with clinical observations, patient history, and epidemiological information. The expected result is Negative.  Fact Sheet for Patients:  EntrepreneurPulse.com.au  Fact Sheet for Healthcare Providers:  IncredibleEmployment.be  This test is no t yet approved or cleared by the [place] FDA and  has been authorized for detection and/or diagnosis of SARS-CoV-2 by FDA under an Emergency Use Authorization (EUA). This EUA will remain  in effect (meaning this test can be used) for the duration of the COVID-19 declaration under Section 564(b)(1) of the Act, 21 U.S.C.section 360bbb-3(b)(1), unless the authorization is terminated  or revoked sooner.       Influenza A by PCR NEGATIVE NEGATIVE Final   Influenza B by PCR NEGATIVE NEGATIVE Final    Comment: (NOTE) The Xpert Xpress  SARS-CoV-2/FLU/RSV plus assay is intended as an aid in the diagnosis of influenza from Nasopharyngeal swab specimens and should not be used as a sole basis for treatment. Nasal washings and aspirates are unacceptable for Xpert Xpress SARS-CoV-2/FLU/RSV testing.  Fact Sheet for Patients: EntrepreneurPulse.com.au  Fact Sheet for Healthcare Providers: IncredibleEmployment.be  This test is not yet approved or cleared by the [place] FDA and has been authorized for detection and/or diagnosis of SARS-CoV-2 by FDA under an Emergency Use Authorization (EUA). This EUA will remain in effect (meaning this test can be used) for the duration of the COVID-19 declaration under Section 564(b)(1) of the Act, 21 U.S.C. section 360bbb-3(b)(1), unless the authorization is terminated or revoked.  Performed at [facility], [address] [place], [place] [address]      Radiological Exams on Admission: No results found.   EKG: I have personally reviewed.  Atrial fibrillation, QTC 474, right bundle blockade, early R wave progression   Assessment/Plan Principal Problem:   UTI (urinary tract infection) Active Problems:   Essential hypertension   Hyperlipidemia with target LDL less than 70   OSA on CPAP  PAF (paroxysmal atrial fibrillation) (HCC) CHA2DS2-VASc = 4. AC = Eliquis   COPD (chronic obstructive pulmonary disease) (HCC)   Normocytic anemia   Prostate cancer (HCC)   Hematuria   Dementia without behavioral disturbance (HCC)   CAD (coronary artery disease)   Depression with anxiety   UTI (urinary tract infection): Patient failed outpatient Macrobid and treatment.  No fever or leukocytosis.  No sepsis. -Placed on MedSurg bed for observation -IV Rocephin -Follow-up urine culture  Essential hypertension -IV hydralazine as needed -Continue home lisinopril, metoprolol  Hyperlipidemia with target LDL less than 70 -Crestor  OSA -  on CPAP  PAF (paroxysmal atrial fibrillation) (HCC) CHA2DS2-VASc = 4. AC = Eliquis -Continue metoprolol -Hold Eliquis due to hematuria  COPD (chronic obstructive pulmonary disease) ([place]): Stable -Bronchodilators  Normocytic anemia: Hemoglobin stable.  7.9 on 02/04/2021--> 8.9 -Follow up with CBC  Prostate cancer ([place]) -Continue Proscar and Myrbetriq  Hematuria: Likely due to UTI and continuation of Eliquis -Hold Eliquis  Dementia without behavioral disturbance (HCC) -Continue donepezil  CAD (coronary artery disease): No chest pain -Continue Crestor  Depression with anxiety -Continue home medications         DVT ppx: SCD Code Status: DNR per his son Family Communication:  Yes, patient's son at bed side Disposition Plan:  Anticipate discharge back to previous environment Consults called:  none Admission status and Level of care: Med-Surg:   for obs          Status is: Observation  The patient remains OBS appropriate and will d/c before 2 midnights.  Dispo: The patient is from: SNF              Anticipated d/c is to: SNF              Patient currently is not medically stable to d/c.   Difficult to place patient No          Date of Service 03/01/2021    [facility]   If 7PM-7AM, please contact night-coverage [email or web address] 03/01/2021, 7:15 PM

## 2021-03-01 NOTE — ED Provider Notes (Signed)
[facility] Emergency Department Provider Note   ____________________________________________   Event Date/Time   First MD Initiated Contact with Patient 03/01/21 1148     (approximate)  I have reviewed the triage vital signs and the nursing notes.   HISTORY  Chief Complaint Hematuria    HPI [name] is a 87 y.o. male with past medical history of hypertension, CAD, atrial fibrillation on Eliquis, COPD, and dementia who presents to the ED for hematuria.  Patient is currently residing at [facility] following operative repair of left hip fracture along with left distal radius fracture.  Son states that he has been doing well up until 2 days ago, when he started complaining of burning when he urinates.  He was started on Macrobid but has continued to complain of dysuria along with increasing hematuria.  Son denies any fevers and patient denies any abdominal pain, flank pain, nausea, vomiting, or changes in bowel movements.  Son does state that patient has been increasingly sleepy and confused since the onset of UTI symptoms.        Past Medical History:  Diagnosis Date   AAA (abdominal aortic aneurysm) ([place]) 05/2019   Relativley Rapid progression from June-Aug 2020 (4.6 - 5.4 cm): s/p EVAR (Dr. [facility]): EVAR - 23 mm prox, 12 cm distal & x 18 cm length Gore Excluder Endoprosthesis Main body (via RFA distal to lowest Renal A); 72m x 14 cm L Contralateral Limb for L Iliac - extended with 8 mm x 6cm LifeStar stent (post-dilated with 7 mm DEB).  Additional R Iliac PTA not required.    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 ([place] 09/15/2017   09-15-2017 DIAGNOSIS:  A. COLON POLYP X 2, ASCENDING; COLD SNARE:  - TUBULAR ADENOMA.  - NEGATIVE FOR HIGH-GRADE DYSPLASIA AND MALIGNANCY.  - NO ADDITIONAL TISSUE IDENTIFIED; FECAL MATERIAL PRESENT.   B. COLON POLYP X 2, DESCENDING; COLD SNARE:  - ADENOCARCINOMA ARISING IN A DYSPLASTIC SESSILE SERRATED ADENOMA, WITH  LYMPHOVASCULAR  INVASION, SEE COMMENT.  - TUBULAR ADENOMA, 2 FRAGMENTS, NEGATIVE FO   Arthritis    Basal cell carcinoma of eyelid 01/03/2013   2019 - R side of Nose   Benign neoplasm of ascending colon    Benign neoplasm of descending colon    Bilateral [place]ac artery stenosis ([place]   Bilateral Iliac A stenting; extension of EVAR limbs into both Iliacs -- additional stent placed in L Iliac.   Cancer ([facility]    Skin cancer   Colon polyps    Complication of anesthesia    severe confusion agitation requiring hospital admission x 4 days   Compression fracture of fourth lumbar vertebra ([place] 04/2019   - s/p Kyphoplasty; T12, L3-5 ([facility]   COPD (chronic obstructive pulmonary disease) (HCC)    Centrilobular Emphysema   Coronary artery disease involving native coronary artery without angina pectoris 1998   - s/p CABG, occluded SVG-D1; patent LIMA-LAD, SVG-OM, SVG- RCA   Dementia ([place]    memory loss - started noting late 2019   Falls frequently    broke foot   Fracture 2021   left foot   GERD (gastroesophageal reflux disease)    Hemorrhoid 02/10/2015   History of hiatal hernia    History of kidney stones    HOH (hard of hearing)    Hypertension    Lewy body dementia ([place] 04/04/2020   Macular degeneration disease    Osteoporosis    PAF (paroxysmal atrial fibrillation) ([place]    on  Eliquis for [facility]    Prostate cancer [facility])    Prostate disorder    Sciatic pain    Chronic   Sleep apnea    cpap   Temporary cerebral vascular dysfunction 02/10/2015   Had negative work-up.  July, 2012.  No medication changes, had forgot them that day, got dehydrated.    Urinary incontinence     Patient Active Problem List   Diagnosis Date Noted   Protein-calorie malnutrition, severe 01/25/2021   Accidental fall 01/24/2021   Closed left hip fracture, initial encounter ([place]) 01/24/2021   Closed fracture of left distal radius 01/24/2021   Dementia without behavioral disturbance ([place]) 01/24/2021    Pubic ramus fracture, left, closed, initial encounter ([place]) 01/24/2021   Hip fracture ([place]) 01/24/2021   AAA (abdominal aortic aneurysm) without rupture ([place]) 06/29/2020   LBD (Lewy body dementia) ([place]) 05/14/2020   Numbness and tingling in left hand 03/02/2020   Hematuria    Acute kidney injury superimposed on CKD ([place])    Sepsis due to gram-negative UTI ([place]) 12/26/2019   Left nephrolithiasis 12/26/2019   Hydronephrosis, left 12/26/2019   Status post abdominal aortic aneurysm (AAA) repair 04/15/2019   Do not intubate, cardiopulmonary resuscitation (CPR)-only code status 10/08/2018   Prostate cancer ([place]) 02/25/2018   Prostatic cyst 01/15/2018   Blood in stool    Abnormal feces    Stricture and stenosis of esophagus    Mild cognitive impairment 08/18/2017   Senile purpura ([place]) 04/29/2017   Anemia 04/29/2017   Frequent falls 04/29/2017   Simple chronic bronchitis ([place]) 12/17/2016   Benign localized hyperplasia of prostate with urinary obstruction 07/15/2016   Elevated PSA 07/15/2016   Incomplete emptying of bladder 07/15/2016   Urge incontinence 07/15/2016   Hypothyroidism 04/25/2016   Macular degeneration 04/25/2016   Erectile dysfunction due to arterial insufficiency    Ischemic heart disease with chronotropic incompetence    CN (constipation) 02/10/2015   DD (diverticular disease) 02/10/2015   Weakness 50/53/9767   Lichen planus 34/19/3790   Restless leg 02/10/2015   Circadian rhythm disorder 02/10/2015   B12 deficiency 02/10/2015   COPD (chronic obstructive pulmonary disease) ([place]) 11/14/2014   Post Inflammatory Lung Changes 11/14/2014   PAF (paroxysmal atrial fibrillation) (HCC) CHA2DS2-VASc = 4. AC = Eliquis    Insomnia 12/09/2013   OSA on CPAP    Dyspnea on exertion - -essentially resolved with BB dose reduction & wgt loss 03/29/2013   Overweight (BMI 25.0-29.9) -- Wgt back up 01/17/2013   Hx of CABG    Essential hypertension    Hyperlipidemia with target LDL  less than 70    Aorto-iliac disease ([place])    BCC (basal cell carcinoma), eyelid 01/03/2013   Allergic rhinitis 08/17/2009   Adaptation reaction 05/28/2009   Acid reflux 05/28/2009   Arthritis, degenerative 05/28/2009   Abnormality of aortic arch branch 06/01/2006   Carotid artery occlusion without infarction 06/01/2006   PAD (peripheral artery disease) - bilateral common iliac stents 12/15/2005   Atherosclerotic heart disease of artery bypass graft 09/01/1997    Past Surgical History:  Procedure Laterality Date   ABDOMINAL AORTIC ENDOVASCULAR STENT GRAFT Bilateral 05/18/2019   Procedure: ABDOMINAL AORTIC ENDOVASCULAR STENT GRAFT;  Surgeon: Algernon Huxley, MD;  Location: [facility] ORS;  Service: Vascular;  Laterality: Bilateral;   ANKLE SURGERY Left    ORIF    Arch aortogram and carotid aortogram  06/02/2006   Dr Oneida Alar did surgery   BASAL CELL CARCINOMA EXCISION  04/2016  Dermatology   CARDIAC CATHETERIZATION  01/30/1998    (Dr. Linard Millers): Native LAD & mRCA CTO. 90% D1. Mod Cx. SVG-D1 CTO. SVG-RCA & SVG-OM along with LIMA-LAD patent;  LV NORMAL Fxn   CAROTID ENDARTERECTOMY Left Oct. 29, 2007   Dr. Oneida Alar:   CATARACT EXTRACTION W/PHACO Right 03/05/2017   Procedure: CATARACT EXTRACTION PHACO AND INTRAOCULAR LENS PLACEMENT ([place]);  Surgeon: Leandrew Koyanagi, MD;  Location: [facility] ORS;  Service: Ophthalmology;  Laterality: Right;  [place] 00:58.5 AP% 10.6 CDE 6.20 Fluid Pack lot # [id number]   CATARACT EXTRACTION W/PHACO Left 04/15/2017   Procedure: CATARACT EXTRACTION PHACO AND INTRAOCULAR LENS PLACEMENT ([place])  Left;  Surgeon: Leandrew Koyanagi, MD;  Location: [place];  Service: Ophthalmology;  Laterality: Left;   COLONOSCOPY WITH PROPOFOL N/A 09/15/2017   Procedure: COLONOSCOPY WITH PROPOFOL;  Surgeon: Lucilla Lame, MD;  Location: [facility] ENDOSCOPY;  Service: Endoscopy;  Laterality: N/A;   COLONOSCOPY WITH PROPOFOL N/A 10/26/2018   Procedure: COLONOSCOPY WITH PROPOFOL;  Surgeon: Lucilla Lame, MD;  Location: [facility] ENDOSCOPY;  Service: Endoscopy;  Laterality: N/A;   CORONARY ARTERY BYPASS GRAFT  1998   LIMA-LAD, SVG-OM, SVG-RPDA, SVG-DIAG   CPET / MET  12/2012   Mild chronotropic incompetence - read 82% of predicted; also reduced effort; peak VO2 15.7 / 75% (did not reach Max effort) -- suggested ischemic response in last 1.5 minutes of exercise. Normal pulmonary function on PFTs but poor response to   CYSTOSCOPY W/ RETROGRADES Left 01/13/2020   Procedure: CYSTOSCOPY WITH RETROGRADE PYELOGRAM;  Surgeon: Billey Co, MD;  Location: [facility] ORS;  Service: Urology;  Laterality: Left;   CYSTOSCOPY WITH STENT PLACEMENT Left 12/26/2019   Procedure: CYSTOSCOPY WITH STENT PLACEMENT;  Surgeon: Billey Co, MD;  Location: [facility] ORS;  Service: Urology;  Laterality: Left;   CYSTOSCOPY/URETEROSCOPY/HOLMIUM LASER/STENT PLACEMENT Left 01/13/2020   Procedure: CYSTOSCOPY/URETEROSCOPY/HOLMIUM LASER/STENT EXCHANGE;  Surgeon: Billey Co, MD;  Location: [facility] ORS;  Service: Urology;  Laterality: Left;   ENDOVASCULAR REPAIR/STENT GRAFT N/A 05/18/2019   Procedure: ENDOVASCULAR REPAIR/STENT GRAFT;  Surgeon: Algernon Huxley, MD;  Location: [facility] INVASIVE CV LAB;; EVAR - 23 mm prox, 12 cm distal & x 18 cm length Gore Excluder Endoprosthesis Main body (via RFA distal to lowest Renal A); 74m x 14 cm L Contralateral Limb for L Iliac - extended with 8 mm x 6cm LifeStar stent; no additional R Iliac PTA needed.    ESOPHAGOGASTRODUODENOSCOPY (EGD) WITH PROPOFOL N/A 09/15/2017   Procedure: ESOPHAGOGASTRODUODENOSCOPY (EGD) WITH PROPOFOL;  Surgeon: WLucilla Lame MD;  [facility];  Service: Endoscopy;  Laterality: N/A;   FRACTURE SURGERY Right 03/19/2015   wrist   FRACTURE SURGERY Left    HIATAL HERNIA REPAIR     ILIAC ARTERY STENT  12/15/2005   PTA and direct stenting rgt and lft common iliac arteries   INCISION AND DRAINAGE OF WOUND Left 01/29/2021   Procedure: IRRIGATION AND DEBRIDEMENT Left Wrist  and Removal of Plate;  Surgeon: KThornton Park MD;  Location: [facility] ORS;  Service: Orthopedics;  Laterality: Left;   INTRAMEDULLARY (IM) NAIL INTERTROCHANTERIC Left 01/29/2021   Procedure: INTRAMEDULLARY (IM) NAIL INTERTROCHANTRIC;  Surgeon: KThornton Park MD;  Location: [facility] OR[facility];  Service: Orthopedics;  Laterality: Left;   KYPHOPLASTY N/A 04/05/2019   Procedure: KYPHOPLASTY T12, L3, L4 L5;  Surgeon: MHessie Knows MD;  Location: [facility] OR[facility];  Service: Orthopedics;  Laterality: N/A;   NM GATED MYOVIEW ([place])  05/'19; 8/'20   a) [facility]: Low Risk - no ischemia or  infarct.  EF > 65%. no RWMA;; b) [facility]): Normal wall motion.  No ischemia or infarction.   OPEN REDUCTION INTERNAL FIXATION (ORIF) DISTAL RADIAL FRACTURE Left 01/13/2019   Procedure: OPEN REDUCTION INTERNAL FIXATION (ORIF) DISTAL  RADIAL FRACTURE - LEFT - SLEEP APNEA;  Surgeon: Hessie Knows, MD;  Location: [facility];  Service: Orthopedics;  Laterality: Left;   OPEN REDUCTION INTERNAL FIXATION (ORIF) DISTAL RADIAL FRACTURE Left 01/29/2021   Procedure: OPEN REDUCTION INTERNAL FIXATION (ORIF) DISTAL RADIAL FRACTURE;  Surgeon: Thornton Park, MD;  Location: [facility];  Service: Orthopedics;  Laterality: Left;   ORIF ELBOW FRACTURE Right 01/01/2017   Procedure: OPEN REDUCTION INTERNAL FIXATION (ORIF) ELBOW/OLECRANON FRACTURE;  Surgeon: Hessie Knows, MD;  Location: [facility];  Service: Orthopedics;  Laterality: Right;   ORIF WRIST FRACTURE Right 03/19/2015   Procedure: OPEN REDUCTION INTERNAL FIXATION (ORIF) WRIST FRACTURE;  Surgeon: Hessie Knows, MD;  Location: [facility];  Service: Orthopedics;  Laterality: Right;   SHOULDER ARTHROSCOPY Right    THORACIC AORTA - CAROTID ANGIOGRAM  October 2007   Dr. 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 Dilated left atrium.   TRANSTHORACIC  ECHOCARDIOGRAM  04/2019   [facility] April 20, 2019): EF 55-60 %.  Mild LVH.  Relatively normal valves.    Prior to Admission medications   Medication Sig Start Date End Date Taking? Authorizing Provider  acetaminophen (TYLENOL) 500 MG tablet Take 500-1,000 mg by mouth every 6 (six) hours as needed for mild pain or moderate pain.    [provider]  bacitracin ([employer] BACITRACIN) ointment Apply 1 application topically every evening. Apply to right eyelid topically for 4 weeks in the evening after instillation of Systane eye drops.    [provider]  Cholecalciferol 50 MCG (2000 UT) TBDP Take 2,000 Units by mouth daily.    [provider]  donepezil (ARICEPT) 10 MG tablet Take 10 mg by mouth at bedtime. 12/26/20   [provider]  ELIQUIS 5 MG TABS tablet TAKE ONE TABLET TWICE DAILY 09/03/20   Leonie Man, MD  ezetimibe (ZETIA) 10 MG tablet TAKE 1 TABLET BY MOUTH DAILY 12/21/20   [name], MD  finasteride (PROSCAR) 5 MG tablet TAKE ONE TABLET BY MOUTH EVERY DAY 12/21/20   Stoioff, Ronda Fairly, MD  fluticasone (FLONASE) 50 MCG/ACT nasal spray PLACE TWO SPRAYS IN BOTH NOSRTILS EVERY DAY AS NEEDED FOR ALLERGIES Patient taking differently: Place 2 sprays into both nostrils daily. 08/07/20   [name], MD  fluticasone-salmeterol [facility] INHUB) 250-50 MCG/ACT AEPB Inhale 1 puff into the lungs in the morning and at bedtime.    [provider]  lisinopril (ZESTRIL) 2.5 MG tablet TAKE ONE TABLET BY MOUTH TWICE DAILY 10/05/20   [name], MD  LORazepam (ATIVAN) 0.5 MG tablet Take 0.5 mg by mouth every 8 (eight) hours as needed for anxiety or agitation. 12/26/20   [provider]  Magnesium Oxide 500 MG TABS Take 500 mg by mouth every evening.    [provider]  metoprolol tartrate (LOPRESSOR) 25 MG tablet Take 25 mg by mouth 2 (two) times daily. 03/30/20   [provider]  mirtazapine (REMERON) 7.5 MG tablet Take 7.5  mg by mouth at bedtime. 12/26/20   [provider]  Multiple Vitamins-Minerals (PRESERVISION AREDS 2 PO) Take 1 tablet by mouth 2 (two) times  daily.     [provider]  MYRBETRIQ 50 MG TB24 tablet TAKE ONE TABLET BY MOUTH EVERY DAY 12/21/20   Stoioff, Ronda Fairly, MD  Polyethyl Glycol-Propyl Glycol (SYSTANE) 0.4-0.3 % SOLN Place 1 drop into both eyes at bedtime.    [provider]  rosuvastatin (CRESTOR) 20 MG tablet TAKE ONE TABLET BY MOUTH EVERY DAY Patient taking differently: Take 20 mg by mouth daily. 12/21/20   [name], MD  traZODone (DESYREL) 50 MG tablet Take 50 mg by mouth at bedtime. 12/24/20   [provider]    Allergies Clonazepam, Codeine, Ropinirole, Hydrocodone, Niacin and related, Oxycodone, and Tramadol  Family History  Problem Relation Age of Onset   Ulcers Mother        Peptic   Dementia Mother    Alcohol abuse Father     Social History Social History   Tobacco Use   Smoking status: Former    Pack years: 0.00    Types: Cigarettes    Quit date: 08/04/1997    Years since quitting: 23.5   Smokeless tobacco: Never  Vaping Use   Vaping Use: Never used  Substance Use Topics   Alcohol use: No   Drug use: No    Review of Systems  Constitutional: No fever/chills.  Positive for fatigue and confusion. Eyes: No visual changes. ENT: No sore throat. Cardiovascular: Denies chest pain. Respiratory: Denies shortness of breath. Gastrointestinal: No abdominal pain.  No nausea, no vomiting.  No diarrhea.  No constipation. Genitourinary: Positive for dysuria. Musculoskeletal: Negative for back pain. Skin: Negative for rash. Neurological: Negative for headaches, focal weakness or numbness.  ____________________________________________   PHYSICAL EXAM:  VITAL SIGNS: ED Triage Vitals  Enc Vitals Group     BP      Pulse      Resp      Temp      Temp src      SpO2      Weight      Height      Head Circumference      Peak  Flow      Pain Score      Pain Loc      Pain Edu?      Excl. in [place]?     Constitutional: Somnolent but arousable to voice. Eyes: Conjunctivae are normal. Head: Atraumatic. Nose: No congestion/rhinnorhea. Mouth/Throat: Mucous membranes are moist. Neck: Normal ROM Cardiovascular: Normal rate, regular rhythm. Grossly normal heart sounds.  2+ radial pulses bilaterally. Respiratory: Normal respiratory effort.  No retractions. Lungs CTAB. Gastrointestinal: Soft and nontender.  No CVA tenderness bilaterally.  No distention. Genitourinary: deferred Musculoskeletal: No lower extremity tenderness nor edema. Neurologic:  Normal speech and language. No gross focal neurologic deficits are appreciated. Skin:  Skin is warm, dry and intact. No rash noted. Psychiatric: Mood and affect are normal. Speech and behavior are normal.  ____________________________________________   LABS (all labs ordered are listed, but only abnormal results are displayed)  Labs Reviewed  CBC WITH DIFFERENTIAL/PLATELET - Abnormal; Notable for the following components:      Result Value   RBC 2.84 (*)    Hemoglobin 8.9 (*)    HCT 27.9 (*)    RDW 15.6 (*)    Monocytes Absolute 1.2 (*)    All other components within normal limits  BASIC METABOLIC PANEL - Abnormal; Notable for the following components:   Glucose, Bld 106 (*)    Calcium 8.6 (*)    Anion gap 3 (*)  All other components within normal limits  URINALYSIS, COMPLETE (UACMP) WITH MICROSCOPIC - Abnormal; Notable for the following components:   Color, Urine RED (*)    APPearance TURBID (*)    Glucose, UA   (*)    Value: TEST NOT REPORTED DUE TO COLOR INTERFERENCE OF URINE PIGMENT   Hgb urine dipstick   (*)    Value: TEST NOT REPORTED DUE TO COLOR INTERFERENCE OF URINE PIGMENT   Bilirubin Urine   (*)    Value: TEST NOT REPORTED DUE TO COLOR INTERFERENCE OF URINE PIGMENT   Ketones, ur   (*)    Value: TEST NOT REPORTED DUE TO COLOR INTERFERENCE OF URINE  PIGMENT   Protein, ur   (*)    Value: TEST NOT REPORTED DUE TO COLOR INTERFERENCE OF URINE PIGMENT   Nitrite   (*)    Value: TEST NOT REPORTED DUE TO COLOR INTERFERENCE OF URINE PIGMENT   Leukocytes,Ua   (*)    Value: TEST NOT REPORTED DUE TO COLOR INTERFERENCE OF URINE PIGMENT   RBC / HPF >50 (*)    WBC, UA >50 (*)    Bacteria, UA MANY (*)    All other components within normal limits  RESP PANEL BY RT-PCR (FLU A&B, COVID) ARPGX2  URINE CULTURE  TROPONIN I (HIGH SENSITIVITY)  TROPONIN I (HIGH SENSITIVITY)   ____________________________________________  EKG  ED ECG REPORT I, Blake Divine, the attending physician, personally viewed and interpreted this ECG.   Date: 03/01/2021  EKG Time: 13:23  Rate: 90  Rhythm: Sinus arrhythmia  Axis: Normal  Intervals:right bundle branch block  ST&T Change: None   PROCEDURES  Procedure(s) performed (including Critical Care):  Procedures   ____________________________________________   INITIAL IMPRESSION / ASSESSMENT AND PLAN / ED COURSE      87 year old male with past medical history of dementia, hypertension, CAD, atrial fibrillation on Eliquis, and COPD who presents to the ED for dysuria and increasing somnolence despite being started on Macrobid 2 days ago.  Patient is somnolent but easily arousable, vital signs are reassuring and not consistent with sepsis.  We will hydrate with IV fluids and straight cath for urine sample, symptoms most concerning for worsening UTI.  He has no abdominal tenderness at this time and we will hold off on advanced imaging.  UA appears consistent with infection, we will send for culture and start patient on Rocephin.  Remainder of labs are reassuring, H&H stable from previous.  Given failure of outpatient antibiotics and worsening hematuria, case discussed with hospitalist for admission.      ____________________________________________   FINAL CLINICAL IMPRESSION(S) / ED DIAGNOSES  Final  diagnoses:  Lower urinary tract infectious disease  Gross hematuria     ED Discharge Orders     None        Note:  This document was prepared using Dragon voice recognition software and may include unintentional dictation errors.    Blake Divine, MD 03/01/21 [phone]

## 2021-03-01 NOTE — ED Notes (Signed)
Pt refuses internal urinary straight cath. SO we compromised. Placed urinal in place for pt to urinate in. Found blood clot in brief as well.

## 2021-03-01 NOTE — ED Notes (Signed)
MD at bedside. 

## 2021-03-01 NOTE — ED Notes (Signed)
Patient confused, continues to take his cardiac monitor off.

## 2021-03-01 NOTE — ED Triage Notes (Signed)
BIB [facility] from [facility] for blood in urine. Pt is under current treatment for UTI but staff advised they seen blood in the urine. Pt is fully demented and wears a brief. Unknown how they collected a specimen to note blood in it. Pt has  right hip replacement and left forearm fracture recently.

## 2021-03-02 DIAGNOSIS — I480 Paroxysmal atrial fibrillation: Secondary | ICD-10-CM | POA: Diagnosis present

## 2021-03-02 DIAGNOSIS — G3183 Dementia with Lewy bodies: Secondary | ICD-10-CM | POA: Diagnosis present

## 2021-03-02 DIAGNOSIS — E43 Unspecified severe protein-calorie malnutrition: Secondary | ICD-10-CM | POA: Diagnosis present

## 2021-03-02 DIAGNOSIS — N3001 Acute cystitis with hematuria: Secondary | ICD-10-CM | POA: Diagnosis present

## 2021-03-02 DIAGNOSIS — Z85038 Personal history of other malignant neoplasm of large intestine: Secondary | ICD-10-CM | POA: Diagnosis not present

## 2021-03-02 DIAGNOSIS — B9620 Unspecified Escherichia coli [E. coli] as the cause of diseases classified elsewhere: Secondary | ICD-10-CM | POA: Diagnosis present

## 2021-03-02 DIAGNOSIS — N390 Urinary tract infection, site not specified: Secondary | ICD-10-CM | POA: Diagnosis present

## 2021-03-02 DIAGNOSIS — Z66 Do not resuscitate: Secondary | ICD-10-CM | POA: Diagnosis present

## 2021-03-02 DIAGNOSIS — F32A Depression, unspecified: Secondary | ICD-10-CM | POA: Diagnosis present

## 2021-03-02 DIAGNOSIS — F0280 Dementia in other diseases classified elsewhere without behavioral disturbance: Secondary | ICD-10-CM | POA: Diagnosis present

## 2021-03-02 DIAGNOSIS — Z20822 Contact with and (suspected) exposure to covid-19: Secondary | ICD-10-CM | POA: Diagnosis present

## 2021-03-02 DIAGNOSIS — Z8546 Personal history of malignant neoplasm of prostate: Secondary | ICD-10-CM | POA: Diagnosis not present

## 2021-03-02 DIAGNOSIS — I10 Essential (primary) hypertension: Secondary | ICD-10-CM | POA: Diagnosis present

## 2021-03-02 DIAGNOSIS — I2510 Atherosclerotic heart disease of native coronary artery without angina pectoris: Secondary | ICD-10-CM | POA: Diagnosis present

## 2021-03-02 DIAGNOSIS — Z681 Body mass index (BMI) 19 or less, adult: Secondary | ICD-10-CM | POA: Diagnosis not present

## 2021-03-02 DIAGNOSIS — G4733 Obstructive sleep apnea (adult) (pediatric): Secondary | ICD-10-CM | POA: Diagnosis present

## 2021-03-02 DIAGNOSIS — E039 Hypothyroidism, unspecified: Secondary | ICD-10-CM | POA: Diagnosis present

## 2021-03-02 DIAGNOSIS — F419 Anxiety disorder, unspecified: Secondary | ICD-10-CM | POA: Diagnosis present

## 2021-03-02 DIAGNOSIS — E785 Hyperlipidemia, unspecified: Secondary | ICD-10-CM | POA: Diagnosis present

## 2021-03-02 DIAGNOSIS — D649 Anemia, unspecified: Secondary | ICD-10-CM | POA: Diagnosis present

## 2021-03-02 DIAGNOSIS — E876 Hypokalemia: Secondary | ICD-10-CM | POA: Diagnosis present

## 2021-03-02 DIAGNOSIS — Z951 Presence of aortocoronary bypass graft: Secondary | ICD-10-CM | POA: Diagnosis not present

## 2021-03-02 DIAGNOSIS — J449 Chronic obstructive pulmonary disease, unspecified: Secondary | ICD-10-CM | POA: Diagnosis present

## 2021-03-02 DIAGNOSIS — Z1611 Resistance to penicillins: Secondary | ICD-10-CM | POA: Diagnosis present

## 2021-03-02 DIAGNOSIS — H04123 Dry eye syndrome of bilateral lacrimal glands: Secondary | ICD-10-CM | POA: Diagnosis present

## 2021-03-02 LAB — CBC
HCT: 29 % — ABNORMAL LOW (ref 39.0–52.0)
Hemoglobin: 9.3 g/dL — ABNORMAL LOW (ref 13.0–17.0)
MCH: 30.2 pg (ref 26.0–34.0)
MCHC: 32.1 g/dL (ref 30.0–36.0)
MCV: 94.2 fL (ref 80.0–100.0)
Platelets: 193 10*3/uL (ref 150–400)
RBC: 3.08 MIL/uL — ABNORMAL LOW (ref 4.22–5.81)
RDW: 15.9 % — ABNORMAL HIGH (ref 11.5–15.5)
WBC: 8.2 10*3/uL (ref 4.0–10.5)
nRBC: 0 % (ref 0.0–0.2)

## 2021-03-02 MED ORDER — MAGIC MOUTHWASH
30.0000 mL | Freq: Four times a day (QID) | ORAL | Status: DC
  Administered 2021-03-02: 30 mL via ORAL
  Filled 2021-03-02 (×8): qty 60

## 2021-03-02 MED ORDER — MAGIC MOUTHWASH
30.0000 mL | Freq: Four times a day (QID) | ORAL | Status: DC
  Administered 2021-03-02: 30 mL via ORAL
  Filled 2021-03-02 (×5): qty 30

## 2021-03-02 NOTE — ED Notes (Signed)
PT with pt ambulating him with walker in the hall upon assumption of care.

## 2021-03-02 NOTE — ED Notes (Signed)
300 ml's dark red urine output noted in condom catheter drainage bag at this time.

## 2021-03-02 NOTE — ED Notes (Signed)
Lab at bedside

## 2021-03-02 NOTE — ED Notes (Signed)
Pt given snack and repositioned. Pt denies complaints at this time.

## 2021-03-02 NOTE — Hospital Course (Signed)
87 y.o. male with medical history significant of hypertension, hyperlipidemia, COPD, GERD, hypothyroidism, depression, anxiety, atrial fibrillation on Eliquis, OSA on CPAP, prostate cancer, Lewy body dementia, CAD, CABG, adenocarcinoma of the sigmoid colon, AAA (s/p of repair), anemia, who presented with dysuria, burning on urination.  From SNF for rehab after admission 5/26-6/6 for left hip fracture.  Started on Macrobid at the facility 6/29 but had persistent symptoms and developed hematuria.  Admitted for management of Acute Cystitis with Hematuria having failed outpatient oral antibiotic treatment.

## 2021-03-02 NOTE — ED Notes (Signed)
Transportation requested  

## 2021-03-02 NOTE — Evaluation (Signed)
Clinical/Bedside Swallow Evaluation Patient Details  Name: [name] MRN: [id number] Date of Birth: August 26, 1934  Today's Date: 03/02/2021 Time: SLP Start Time (ACUTE ONLY): 0930 SLP Stop Time (ACUTE ONLY): 1020 SLP Time Calculation (min) (ACUTE ONLY): 50 min  Past Medical History:  Past Medical History:  Diagnosis Date   AAA (abdominal aortic aneurysm) ([place]) 05/2019   Relativley Rapid progression from June-Aug 2020 (4.6 - 5.4 cm): s/p EVAR (Dr. Lucky Cowboy [facility]): EVAR - 23 mm prox, 12 cm distal & x 18 cm length Gore Excluder Endoprosthesis Main body (via RFA distal to lowest Renal A); 13m x 14 cm L Contralateral Limb for L Iliac - extended with 8 mm x 6cm LifeStar stent (post-dilated with 7 mm DEB).  Additional R Iliac PTA not required.    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 ([place] 09/15/2017   09-15-2017 DIAGNOSIS:  A. COLON POLYP X 2, ASCENDING; COLD SNARE:  - TUBULAR ADENOMA.  - NEGATIVE FOR HIGH-GRADE DYSPLASIA AND MALIGNANCY.  - NO ADDITIONAL TISSUE IDENTIFIED; FECAL MATERIAL PRESENT.   B. COLON POLYP X 2, DESCENDING; COLD SNARE:  - ADENOCARCINOMA ARISING IN A DYSPLASTIC SESSILE SERRATED ADENOMA, WITH  LYMPHOVASCULAR INVASION, SEE COMMENT.  - TUBULAR ADENOMA, 2 FRAGMENTS, NEGATIVE FO   Arthritis    Basal cell carcinoma of eyelid 01/03/2013   2019 - R side of Nose   Benign neoplasm of ascending colon    Benign neoplasm of descending colon    Bilateral iliac artery stenosis ([place] 2007   Bilateral Iliac A stenting; extension of EVAR limbs into both Iliacs -- additional stent placed in L Iliac.   Cancer ([facility]    Skin cancer   Colon polyps    Complication of anesthesia    severe confusion agitation requiring hospital admission x 4 days   Compression fracture of fourth lumbar vertebra ([place] 04/2019   - s/p Kyphoplasty; T12, L3-5 (*[facility])   COPD (chronic obstructive pulmonary disease) (HCC)    Centrilobular Emphysema   Coronary artery disease involving native coronary artery without angina  pectoris 1998   - s/p CABG, occluded SVG-D1; patent LIMA-LAD, SVG-OM, SVG- RCA   Dementia ([facility]    memory loss - started noting late 2019   Falls frequently    broke foot   Fracture 2021   left foot   GERD (gastroesophageal reflux disease)    Hemorrhoid 02/10/2015   History of hiatal hernia    History of kidney stones    HOH (hard of hearing)    Hypertension    Lewy body dementia ([place] 04/04/2020   Macular degeneration disease    Osteoporosis    PAF (paroxysmal atrial fibrillation) (HCC)    on Eliquis for OAC - Rate Control    Prostate cancer (HCC)    Prostate disorder    Sciatic pain    Chronic   Sleep apnea    cpap   Temporary cerebral vascular dysfunction 02/10/2015   Had negative work-up.  July, 2012.  No medication changes, had forgot them that day, got dehydrated.    Urinary incontinence    Past Surgical History:  Past Surgical History:  Procedure Laterality Date   ABDOMINAL AORTIC ENDOVASCULAR STENT GRAFT Bilateral 05/18/2019   Procedure: ABDOMINAL AORTIC ENDOVASCULAR STENT GRAFT;  Surgeon: DAlgernon Huxley MD;  Location: [facility] ORS;  Service: Vascular;  Laterality: Bilateral;   ANKLE SURGERY Left    ORIF    Arch aortogram and carotid aortogram  06/02/2006   Dr FOneida Alardid surgery   BASAL CELL CARCINOMA  EXCISION  04/2016   Dermatology   CARDIAC CATHETERIZATION  01/30/1998    (Dr. Linard Millers): Native LAD & mRCA CTO. 90% D1. Mod Cx. SVG-D1 CTO. SVG-RCA & SVG-OM along with LIMA-LAD patent;  LV NORMAL Fxn   CAROTID ENDARTERECTOMY Left Oct. 29, 2007   Dr. Oneida Alar:   CATARACT EXTRACTION W/PHACO Right 03/05/2017   Procedure: CATARACT EXTRACTION PHACO AND INTRAOCULAR LENS PLACEMENT ([place]);  Surgeon: Leandrew Koyanagi, MD;  Location: [facility] ORS;  Service: Ophthalmology;  Laterality: Right;  [place] 00:58.5 AP% 10.6 CDE 6.20 Fluid Pack lot # [id number]   CATARACT EXTRACTION W/PHACO Left 04/15/2017   Procedure: CATARACT EXTRACTION PHACO AND INTRAOCULAR LENS PLACEMENT ([place])  Left;  Surgeon:  Leandrew Koyanagi, MD;  Location: [place];  Service: Ophthalmology;  Laterality: Left;   COLONOSCOPY WITH PROPOFOL N/A 09/15/2017   Procedure: COLONOSCOPY WITH PROPOFOL;  Surgeon: Lucilla Lame, MD;  Location: [facility] ENDOSCOPY;  Service: Endoscopy;  Laterality: N/A;   COLONOSCOPY WITH PROPOFOL N/A 10/26/2018   Procedure: COLONOSCOPY WITH PROPOFOL;  Surgeon: Lucilla Lame, MD;  Location: [facility] ENDOSCOPY;  Service: Endoscopy;  Laterality: N/A;   CORONARY ARTERY BYPASS GRAFT  1998   LIMA-LAD, SVG-OM, SVG-RPDA, SVG-DIAG   CPET / MET  12/2012   Mild chronotropic incompetence - read 82% of predicted; also reduced effort; peak VO2 15.7 / 75% (did not reach Max effort) -- suggested ischemic response in last 1.5 minutes of exercise. Normal pulmonary function on PFTs but poor response to   CYSTOSCOPY W/ RETROGRADES Left 01/13/2020   Procedure: CYSTOSCOPY WITH RETROGRADE PYELOGRAM;  Surgeon: Billey Co, MD;  Location: [facility] ORS;  Service: Urology;  Laterality: Left;   CYSTOSCOPY WITH STENT PLACEMENT Left 12/26/2019   Procedure: CYSTOSCOPY WITH STENT PLACEMENT;  Surgeon: Billey Co, MD;  Location: [facility] ORS;  Service: Urology;  Laterality: Left;   CYSTOSCOPY/URETEROSCOPY/HOLMIUM LASER/STENT PLACEMENT Left 01/13/2020   Procedure: CYSTOSCOPY/URETEROSCOPY/HOLMIUM LASER/STENT EXCHANGE;  Surgeon: Billey Co, MD;  Location: [facility] ORS;  Service: Urology;  Laterality: Left;   ENDOVASCULAR REPAIR/STENT GRAFT N/A 05/18/2019   Procedure: ENDOVASCULAR REPAIR/STENT GRAFT;  Surgeon: Algernon Huxley, MD;  Location: [facility] INVASIVE CV LAB;; EVAR - 23 mm prox, 12 cm distal & x 18 cm length Gore Excluder Endoprosthesis Main body (via RFA distal to lowest Renal A); 49m x 14 cm L Contralateral Limb for L Iliac - extended with 8 mm x 6cm LifeStar stent; no additional R Iliac PTA needed.    ESOPHAGOGASTRODUODENOSCOPY (EGD) WITH PROPOFOL N/A 09/15/2017   Procedure: ESOPHAGOGASTRODUODENOSCOPY (EGD) WITH PROPOFOL;   Surgeon: WLucilla Lame MD;  [facility];  Service: Endoscopy;  Laterality: N/A;   FRACTURE SURGERY Right 03/19/2015   wrist   FRACTURE SURGERY Left    HIATAL HERNIA REPAIR     ILIAC ARTERY STENT  12/15/2005   PTA and direct stenting rgt and lft common iliac arteries   INCISION AND DRAINAGE OF WOUND Left 01/29/2021   Procedure: IRRIGATION AND DEBRIDEMENT Left Wrist and Removal of Plate;  Surgeon: KThornton Park MD;  Location: [facility] ORS;  Service: Orthopedics;  Laterality: Left;   INTRAMEDULLARY (IM) NAIL INTERTROCHANTERIC Left 01/29/2021   Procedure: INTRAMEDULLARY (IM) NAIL INTERTROCHANTRIC;  Surgeon: KThornton Park MD;  Location: [facility] ORS;  Service: Orthopedics;  Laterality: Left;   KYPHOPLASTY N/A 04/05/2019   Procedure: KYPHOPLASTY T12, L3, L4 L5;  Surgeon: MHessie Knows MD;  Location: [facility] ORS;  Service: Orthopedics;  Laterality: N/A;   NM GATED MYOVIEW ([place])  05/'19; 8/'20   a) [facility]: Low  Risk - no ischemia or infarct.  EF > 65%. no RWMA;; b) [facility]): Normal wall motion.  No ischemia or infarction.   OPEN REDUCTION INTERNAL FIXATION (ORIF) DISTAL RADIAL FRACTURE Left 01/13/2019   Procedure: OPEN REDUCTION INTERNAL FIXATION (ORIF) DISTAL  RADIAL FRACTURE - LEFT - SLEEP APNEA;  Surgeon: Hessie Knows, MD;  Location: [facility] ORS;  Service: Orthopedics;  Laterality: Left;   OPEN REDUCTION INTERNAL FIXATION (ORIF) DISTAL RADIAL FRACTURE Left 01/29/2021   Procedure: OPEN REDUCTION INTERNAL FIXATION (ORIF) DISTAL RADIAL FRACTURE;  Surgeon: Thornton Park, MD;  Location: [facility] ORS;  Service: Orthopedics;  Laterality: Left;   ORIF ELBOW FRACTURE Right 01/01/2017   Procedure: OPEN REDUCTION INTERNAL FIXATION (ORIF) ELBOW/OLECRANON FRACTURE;  Surgeon: Hessie Knows, MD;  Location: [facility] ORS;  Service: Orthopedics;  Laterality: Right;   ORIF WRIST FRACTURE Right 03/19/2015   Procedure: OPEN REDUCTION INTERNAL FIXATION (ORIF) WRIST FRACTURE;  Surgeon: Hessie Knows, MD;  Location: [facility]  ORS;  Service: Orthopedics;  Laterality: Right;   SHOULDER ARTHROSCOPY Right    THORACIC AORTA - CAROTID ANGIOGRAM  October 2007   Dr. 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 Dilated left atrium.   TRANSTHORACIC ECHOCARDIOGRAM  04/2019   [facility] April 20, 2019): EF 55-60 %.  Mild LVH.  Relatively normal valves.   HPI:  Pt is an 87 y.o. male with PMHx significant of Lewy Body Dementia, Hiatal Hernia, HTN, HLD, COPD, GERD, hypothyroidism, depression, anxiety, atrial fibrillation on Eliquis, OSA on CPAP, prostate cancer, CAD, CABG, adenocarcinoma of the sigmoid colon, AAA (s/p of repair), anemia, compression fracture and vertebroplasty at T12, L3, L4, L5, recent fall resulting in Rt inferior pubic ramus fracture, L hip fracture with ORIF repair (BLE WBAT) and L distal radial fracture with ORIF repair (LUE NWBing) after fall (admission 01/24/21-02/04/21, d/c to SNF for rehab), who presents with dysuria, burning on urination, and hematuria.   CXR: "Ill-defined opacity right lower lobe concerning for developing  pneumonia. Note that atypical pneumonia could present in this  manner. Check of COVID-19 status may be warranted given this  appearance. Lungs elsewhere clear. Except for calcified granuloma  right upper lobe.".   Assessment / Plan / Recommendation Clinical Impression  Pt appears to present w/ grossly adequate oropharyngeal phase swallow function w/ No overt oropharyngeal phase dysphagia noted, No neuromuscular deficits appreciated. Pt consumed po trials w/ No immediate, overt, clinical s/s of aspiration during po trials. Pt appears at reduced risk for aspiration following general aspiration precautions and using a min modified diet consistency d/t Lacking Dentition for mastication of solids. However, pt does have challenging factors  that could impact oropharyngeal swallowing and risk for aspiration to be present including: macular degeneration disease impacting ability to self-feed, loose-fitting lower Denture plate for effective mastication, GERD, Hiatal Hernia, recent acute illness hip fx/surgery; baseline Lewy Body Dementia. These factors can increase risk for aspiration, dysphagia as well as decreased oral intake overall. Support and monitoring at meals is indicated.     During po trials, pt consumed all consistencies w/ no overt coughing, decline in vocal quality, or change in respiratory presentation during/post trials. Oral phase appeared grossly [facility] w/ timely bolus management, mashing/gumming, and control of bolus propulsion for A-P transfer for swallowing. O2 sats remained 97%. Oral clearing achieved w/ all trial consistencies -- moistened, soft foods more broken down for easier mashing/gumming given. OM  Exam appeared [facility] w/ no unilateral weakness noted. Speech Clear w/ low volume -- suspect related to the LB Dementia and HOH. Pt fed self w/ Mod setup support -- held Cup to drink.       Recommend continue a more MINCED consistency diet w/ well-moistened foods; Thin liquids -- VIA CUP for safer drinking of liquids, and pt should help to Hold Cup when drinking. Recommend general aspiration and REFLUX precautions, assistance at meals, Pills CRUSHED vs WHOLE in Puree for safer, easier swallowing. Education given on Pills in Puree; food consistencies and easy to eat options; general aspiration and REFLUX precautions posted. ST can be available for further education/needs while admitted. NSG to reconsult if any new needs arise while admitted. NSG updated, agreed. MD updated. Recommend Dietician f/u for support. SLP Visit Diagnosis: Dysphagia, oropharyngeal phase (R13.12)    Aspiration Risk  Mild aspiration risk;Risk for inadequate nutrition/hydration (reduced following general precautions)    Diet Recommendation  (Dysphagia level  2) MINCED consistency diet w/ well-moistened foods; Thin liquids -- VIA CUP for safer drinking of liquids, and pt should help to Hold Cup when drinking. Recommend general aspiration and REFLUX precautions, assistance at meals. Support and monitor at meals.  Medication Administration: Crushed with puree    Other  Recommendations Recommended Consults:  (f/u w/ GI if needed; Dietician support) Oral Care Recommendations: Oral care BID;Oral care before and after PO;Staff/trained caregiver to provide oral care Other Recommendations:  (n/a)   Follow up Recommendations None      Frequency and Duration  (n/a)   (n/a)       Prognosis Prognosis for Safe Diet Advancement: Fair Barriers to Reach Goals: Cognitive deficits;Time post onset;Severity of deficits (baseline GI dysmotility)      Swallow Study   General Date of Onset: 03/01/21 HPI: Pt is an 87 y.o. male with PMHx significant of Lewy Body Dementia, Hiatal Hernia, HTN, HLD, COPD, GERD, hypothyroidism, depression, anxiety, atrial fibrillation on Eliquis, OSA on CPAP, prostate cancer, CAD, CABG, adenocarcinoma of the sigmoid colon, AAA (s/p of repair), anemia, compression fracture and vertebroplasty at T12, L3, L4, L5, recent fall resulting in Rt inferior pubic ramus fracture, L hip fracture with ORIF repair (BLE WBAT) and L distal radial fracture with ORIF repair (LUE NWBing) after fall (admission 01/24/21-02/04/21, d/c to SNF for rehab), who presents with dysuria, burning on urination, and hematuria.   CXR: "Ill-defined opacity right lower lobe concerning for developing  pneumonia. Note that atypical pneumonia could present in this  manner. Check of COVID-19 status may be warranted given this  appearance. Lungs elsewhere clear. Except for calcified granuloma  right upper lobe.". Type of Study: Bedside Swallow Evaluation Previous Swallow Assessment: 01/2021 Diet Prior to this Study: Regular;Thin liquids (at home per pt) Temperature Spikes Noted: No  (wbc 8.2) Respiratory Status: Room air History of Recent Intubation: No Behavior/Cognition: Alert;Cooperative;Pleasant mood;Distractible;Requires cueing (LB Dementia baseline) Oral Cavity Assessment: Dry Oral Care Completed by SLP: Yes Oral Cavity - Dentition: Edentulous (ill-fitting bottom Denture and top Denture NOT in place) Vision: Functional for self-feeding Self-Feeding Abilities: Able to feed self;Needs assist;Needs set up Patient Positioning: Upright in bed (needed assistance to sit Upright) Baseline Vocal Quality: Normal;Low vocal intensity Volitional Cough: Strong Volitional Swallow: Able to elicit    Oral/Motor/Sensory Function Overall Oral Motor/Sensory Function: Within functional limits   Ice Chips Ice chips: Within functional limits Presentation: Spoon (fed; 4 trials)   Thin Liquid Thin Liquid: Within functional limits Presentation: Cup;Self Fed (~4-5 ozs total)  Nectar Thick Nectar Thick Liquid: Not tested   Honey Thick Honey Thick Liquid: Not tested   Puree Puree: Within functional limits Presentation: Spoon (fed; 10 trials)   Solid     Solid: Impaired Presentation: Spoon (fed; 6 trials) Oral Phase Impairments: Impaired mastication (lacking Dentition) Oral Phase Functional Implications: Impaired mastication (lacking Dentition)         Orinda Kenner, MS, CCC-SLP Speech Language Pathologist Rehab Services [phone] Jhana Giarratano 03/02/2021,2:23 PM

## 2021-03-02 NOTE — Progress Notes (Signed)
PROGRESS NOTE    [name]   XAJ:[id number]  DOB: 09-Feb-1934  PCP: Monico Blitz, NP    DOA: 03/01/2021 LOS: 0   Assessment & Plan   Principal Problem:   UTI (urinary tract infection) Active Problems:   Essential hypertension   Hyperlipidemia with target LDL less than 70   OSA on CPAP   PAF (paroxysmal atrial fibrillation) (HCC) CHA2DS2-VASc = 4. AC = Eliquis   COPD (chronic obstructive pulmonary disease) (HCC)   Normocytic anemia   Prostate cancer (HCC)   Hematuria   Dementia without behavioral disturbance (HCC)   CAD (coronary artery disease)   Depression with anxiety   Acute cystitis with hematuria -failed outpatient Macrobid.  Continue IV Rocephin.  Follow-up urine culture.  Monitor hemoglobin given hematuria.  Consider urology input but suspect this bleeding is due to hemorrhagic cystitis.  Hold Eliquis.  Essential hypertension -continue home lisinopril, metoprolol.  Hydralazine as needed  Hypokalemia, LDL goal less than 70 -continue Crestor, Zetia  OSA -on CPAP  Paroxysmal A. Fib - CHA2DS2-VASc =4.  Rate controlled.  Eliquis on hold due to hematuria.  Continue metoprolol.  COPD -stable.  Bronchodilators  Chronic normocytic anemia -hemoglobin stable.  Monitor CBCs.  History of prostate cancer -continue Proscar and Myrbetriq  Dementia without behavioral disturbance -continue Aricept.  Delirium precautions.  Depression with anxiety -continue home Ativan as needed, Remeron, trazodone at bedtime  Dry eyes -Liquifilm Tears at bedtime  Allergic rhinitis -continue Flonase    Patient BMI: Body mass index is 19.79 kg/m.   DVT prophylaxis: SCDs Start: 03/01/21 2311   Diet:  Diet Orders (From admission, onward)     Start     Ordered   03/01/21 2313  Diet NPO time specified  Diet effective now        03/01/21 2312              Code Status: DNR   Brief Narrative / Hospital Course to Date:   87 y.o. male with medical history significant of  hypertension, hyperlipidemia, COPD, GERD, hypothyroidism, depression, anxiety, atrial fibrillation on Eliquis, OSA on CPAP, prostate cancer, Lewy body dementia, CAD, CABG, adenocarcinoma of the sigmoid colon, AAA (s/p of repair), anemia, who presented with dysuria, burning on urination.  From SNF for rehab after admission 5/26-6/6 for left hip fracture.  Started on Macrobid at the facility 6/29 but had persistent symptoms and developed hematuria.  Admitted for management of Acute Cystitis with Hematuria having failed outpatient oral antibiotic treatment.   Subjective 03/02/21    Patient seen in the ED 1 holding for bed today.  He reports having left side pain, no flank pain, points to his groin and lateral hip and thigh, suspect related to lower back.  He denies fevers chills, nausea vomiting, chest pain shortness of breath or other acute complaints at this time.   Disposition Plan & Communication   Status is: Inpatient  Remains inpatient appropriate because: UTI with hematuria failed outpatient therapy, warrants IV antibiotics until culture results, monitoring of Hbg with bleeding.  Dispo: The patient is from: SNF              Anticipated d/c is to: SNF              Patient currently is not medically stable to d/c.   Difficult to place patient No      Consults, Procedures, Significant Events   Consultants:  None  Procedures:  None  Antimicrobials:  Anti-infectives (From admission, onward)  Start     Dose/Rate Route Frequency Ordered Stop   03/02/21 1500  cefTRIAXone (ROCEPHIN) 1 g in sodium chloride 0.9 % 100 mL IVPB        1 g 200 mL/hr over 30 Minutes Intravenous Every 24 hours 03/01/21 1612     03/01/21 1500  cefTRIAXone (ROCEPHIN) 1 g in sodium chloride 0.9 % 100 mL IVPB        1 g 200 mL/hr over 30 Minutes Intravenous  Once 03/01/21 1456 03/01/21 1520         Micro    Objective   Vitals:   03/01/21 2144 03/02/21 0036 03/02/21 0242 03/02/21 0557  BP: (!)  99/57  (!) 95/59 94/70  Pulse: 97 88 (!) 102 86  Resp: 20 18 18 18   Temp:      TempSrc:      SpO2: 100% 96% 98% 99%  Weight:      Height:        Intake/Output Summary (Last 24 hours) at 03/02/2021 0754 Last data filed at 03/01/2021 1414 Gross per 24 hour  Intake 1000 ml  Output 100 ml  Net 900 ml   Filed Weights   03/01/21 1155  Weight: 66.2 kg    Physical Exam:  General exam: awake, alert, no acute distress Respiratory system: CTAB, no wheezes, rales or rhonchi, normal respiratory effort. Cardiovascular system: normal S1/S2, RRR, no JVD, murmurs, rubs, gallops, no pedal edema.   Gastrointestinal system: soft, NT, ND, no HSM felt, +bowel sounds. Genitourinary: dark red hematuria in bag, no flank tenderness Central nervous system: A&O. no gross focal neurologic deficits, normal speech Extremities: left forearm cast in place, no edema, normal tone Psychiatry: normal mood, congruent affect  Labs   Data Reviewed: I have personally reviewed following labs and imaging studies  CBC: Recent Labs  Lab 03/01/21 1211  WBC 8.3  NEUTROABS 5.3  HGB 8.9*  HCT 27.9*  MCV 98.2  PLT 601   Basic Metabolic Panel: Recent Labs  Lab 03/01/21 1211  NA 136  K 4.1  CL 104  CO2 29  GLUCOSE 106*  BUN 16  CREATININE 0.81  CALCIUM 8.6*   GFR: Estimated Creatinine Clearance: 62.4 mL/min (by C-G formula based on SCr of 0.81 mg/dL). Liver Function Tests: No results for input(s): AST, ALT, ALKPHOS, BILITOT, PROT, ALBUMIN in the last 168 hours. No results for input(s): LIPASE, AMYLASE in the last 168 hours. No results for input(s): AMMONIA in the last 168 hours. Coagulation Profile: No results for input(s): INR, PROTIME in the last 168 hours. Cardiac Enzymes: No results for input(s): CKTOTAL, CKMB, CKMBINDEX, TROPONINI in the last 168 hours. BNP (last 3 results) No results for input(s): PROBNP in the last 8760 hours. HbA1C: No results for input(s): HGBA1C in the last 72  hours. CBG: No results for input(s): GLUCAP in the last 168 hours. Lipid Profile: No results for input(s): CHOL, HDL, LDLCALC, TRIG, CHOLHDL, LDLDIRECT in the last 72 hours. Thyroid Function Tests: No results for input(s): TSH, T4TOTAL, FREET4, T3FREE, THYROIDAB in the last 72 hours. Anemia Panel: No results for input(s): VITAMINB12, FOLATE, FERRITIN, TIBC, IRON, RETICCTPCT in the last 72 hours. Sepsis Labs: No results for input(s): PROCALCITON, LATICACIDVEN in the last 168 hours.  Recent Results (from the past 240 hour(s))  Resp Panel by RT-PCR (Flu A&B, Covid) Nasopharyngeal Swab     Status: None   Collection Time: 03/01/21 11:41 AM   Specimen: Nasopharyngeal Swab; Nasopharyngeal(NP) swabs in vial transport medium  Result Value  Ref Range Status   SARS Coronavirus 2 by RT PCR NEGATIVE NEGATIVE Final    Comment: (NOTE) SARS-CoV-2 target nucleic acids are NOT DETECTED.  The SARS-CoV-2 RNA is generally detectable in upper respiratory specimens during the acute phase of infection. The lowest concentration of SARS-CoV-2 viral copies this assay can detect is 138 copies/mL. A negative result does not preclude SARS-Cov-2 infection and should not be used as the sole basis for treatment or other patient management decisions. A negative result may occur with  improper specimen collection/handling, submission of specimen other than nasopharyngeal swab, presence of viral mutation(s) within the areas targeted by this assay, and inadequate number of viral copies(<138 copies/mL). A negative result must be combined with clinical observations, patient history, and epidemiological information. The expected result is Negative.  Fact Sheet for Patients:  EntrepreneurPulse.com.au  Fact Sheet for Healthcare Providers:  IncredibleEmployment.be  This test is no t yet approved or cleared by the [place] FDA and  has been authorized for detection and/or  diagnosis of SARS-CoV-2 by FDA under an Emergency Use Authorization (EUA). This EUA will remain  in effect (meaning this test can be used) for the duration of the COVID-19 declaration under Section 564(b)(1) of the Act, 21 U.S.C.section 360bbb-3(b)(1), unless the authorization is terminated  or revoked sooner.       Influenza A by PCR NEGATIVE NEGATIVE Final   Influenza B by PCR NEGATIVE NEGATIVE Final    Comment: (NOTE) The Xpert Xpress SARS-CoV-2/FLU/RSV plus assay is intended as an aid in the diagnosis of influenza from Nasopharyngeal swab specimens and should not be used as a sole basis for treatment. Nasal washings and aspirates are unacceptable for Xpert Xpress SARS-CoV-2/FLU/RSV testing.  Fact Sheet for Patients: EntrepreneurPulse.com.au  Fact Sheet for Healthcare Providers: IncredibleEmployment.be  This test is not yet approved or cleared by the [place] FDA and has been authorized for detection and/or diagnosis of SARS-CoV-2 by FDA under an Emergency Use Authorization (EUA). This EUA will remain in effect (meaning this test can be used) for the duration of the COVID-19 declaration under Section 564(b)(1) of the Act, 21 U.S.C. section 360bbb-3(b)(1), unless the authorization is terminated or revoked.  Performed at [facility], [address] [place], [place] [address]       Imaging Studies   No results found.   Medications   Scheduled Meds:  vitamin C  500 mg Oral BID   bacitracin  1 application Topical QPM   benzocaine  1 application Mouth/Throat QID   cholecalciferol  2,000 Units Oral Daily   dextromethorphan-guaiFENesin  1 tablet Oral BID   donepezil  10 mg Oral QHS   ezetimibe  10 mg Oral Daily   feeding supplement (PRO-STAT SUGAR FREE 64)  30 mL Oral BID   finasteride  5 mg Oral Daily   lisinopril  2.5 mg Oral BID   magic mouthwash  30 mL Oral QID   magnesium oxide  400 mg Oral QPM   metoprolol  tartrate  25 mg Oral BID   mirabegron ER  50 mg Oral Daily   mirtazapine  7.5 mg Oral QHS   mometasone-formoterol  2 puff Inhalation BID   multivitamin with minerals  1 tablet Oral Daily   polyvinyl alcohol  1 drop Both Eyes QHS   rosuvastatin  20 mg Oral Daily   traZODone  50 mg Oral QHS   Continuous Infusions:  cefTRIAXone (ROCEPHIN)  IV         LOS: 0 days  Time spent: 30 minutes    Ezekiel Slocumb, DO [facility]  03/02/2021, 7:54 AM      If 7PM-7AM, please contact night-coverage. How to contact the [facility] Attending or Consulting provider [place] or covering provider during after hours [place], for this patient?    Check the care team in [facility] and look for a) attending/consulting [facility] provider listed and b) the [facility] team listed Log into [email or web address] and use [place]'s universal password to access. If you do not have the password, please contact the hospital operator. Locate the [facility] provider you are looking for under [facility] and page to a number that you can be directly reached. If you still have difficulty reaching the provider, please page the [facility] (Director on Call) for the Hospitalists listed on amion for assistance.

## 2021-03-02 NOTE — Evaluation (Signed)
Physical Therapy Evaluation Patient Details Name: [name] MRN: [id number] DOB: July 16, 1934 Today's Date: 03/02/2021   History of Present Illness  Pt is an 87 y.o. male with PMHx significant of HTN, HLD, COPD, GERD, hypothyroidism, depression, anxiety, atrial fibrillation on Eliquis, OSA on CPAP, prostate cancer, Lewy body dementia, CAD, CABG, adenocarcinoma of the sigmoid colon, AAA (s/p of repair), anemia, compression fracture and vertebroplasty at T12, L3, L4, L5, recent fall resulting in Rt inferior pubic ramus fracture, L hip fracture with ORIF repair (BLE WBAT) and L distal radial fracture with ORIF repair (LUE NWBing) after fall (admission 01/24/21-02/04/21, d/c to SNF for rehab), who presents with dysuria, burning on urination, and hematuria.   Clinical Impression  Patient easily wakes, oriented to name only. Per chart pt resides at a memory care unit, and recently seen due to a fall. Pt is ambulatory with walker at baseline.  The patient demonstrated supine <> sit with minA for LLE at pt request. He demonstrated fair sitting balance. Cues for hand placement and initiation of task for sit <> Stand with RW and L platform attachment and CGA. He was able to ambulate ~15ft total, limited in distance due to L hip pain, minA-CGA (1 small LOB noted, minA to correct). Returned to supine and OT at bedside.  Overall the patient demonstrated deficits (see "PT Problem List") that impede the patient's functional abilities, safety, and mobility and would benefit from skilled PT intervention. Recommendation is returning to memory care unit if they can provide physical assistance and adequate supervision.      Follow Up Recommendations Home health PT;Supervision for mobility/OOB;Supervision/Assistance - 24 hour    Equipment Recommendations  None recommended by PT    Recommendations for Other Services       Precautions / Restrictions Precautions Precautions: Fall Restrictions Weight Bearing  Restrictions: Yes LUE Weight Bearing: Non weight bearing LLE Weight Bearing: Weight bearing as tolerated      Mobility  Bed Mobility Overal bed mobility: Needs Assistance Bed Mobility: Supine to Sit;Sit to Supine     Supine to sit: Min assist Sit to supine: Min assist   General bed mobility comments: light minA for LLE    Transfers Overall transfer level: Needs assistance Equipment used: Rolling walker (2 wheeled) (with L platform attachment) Transfers: Sit to/from Stand Sit to Stand: Min guard;From elevated surface         General transfer comment: cues for hand placement  Ambulation/Gait Ambulation/Gait assistance: Min assist;Min guard Gait Distance (Feet): 15 Feet Assistive device: Rolling walker (2 wheeled)   Gait velocity: decreased   General Gait Details: one small LOB noted, minA to correct with RW. pt fatigued and reported significant L hip pain  Stairs            Wheelchair Mobility    Modified Rankin (Stroke Patients Only)       Balance Overall balance assessment: Needs assistance Sitting-balance support: Feet supported;Single extremity supported Sitting balance-Leahy Scale: Fair     Standing balance support: Bilateral upper extremity supported Standing balance-Leahy Scale: Poor Standing balance comment: reliant on RW support                             Pertinent Vitals/Pain Pain Assessment: Faces Faces Pain Scale: Hurts whole lot Pain Location: L hip Pain Descriptors / Indicators: Moaning;Grimacing;Guarding Pain Intervention(s): Limited activity within patient's tolerance;Repositioned;Monitored during session    Home Living Family/patient expects to be discharged to:: Assisted living  Home Equipment: Walker - 4 wheels Additional Comments: transitioned to memory care (mid March)    Prior Function Level of Independence: Needs assistance         Comments: pt recently at SNF, per chart pt was to  discharge back to home place but came to ED for assessment     Hand Dominance        Extremity/Trunk Assessment   Upper Extremity Assessment Upper Extremity Assessment: Defer to OT evaluation    Lower Extremity Assessment Lower Extremity Assessment: Generalized weakness (assistance needed to move LLE on/off bed for mobility)    Cervical / Trunk Assessment Cervical / Trunk Assessment: Normal  Communication   Communication: HOH  Cognition Arousal/Alertness: Awake/alert Behavior During Therapy: WFL for tasks assessed/performed Overall Cognitive Status: History of cognitive impairments - at baseline                                 General Comments: oriented to self only      General Comments      Exercises     Assessment/Plan    PT Assessment Patient needs continued PT services  PT Problem List Decreased strength;Decreased range of motion;Decreased activity tolerance;Decreased balance;Decreased mobility;Decreased knowledge of use of DME;Decreased safety awareness;Pain;Decreased knowledge of precautions       PT Treatment Interventions DME instruction;Balance training;Gait training;Neuromuscular re-education;Stair training;Functional mobility training;Patient/family education;Therapeutic activities;Therapeutic exercise    PT Goals (Current goals can be found in the Care Plan section)  Acute Rehab PT Goals Patient Stated Goal: to decrease pain PT Goal Formulation: With patient Time For Goal Achievement: 03/16/21 Potential to Achieve Goals: Fair    Frequency Min 2X/week   Barriers to discharge        Co-evaluation               AM-PAC PT "6 Clicks" Mobility  Outcome Measure Help needed turning from your back to your side while in a flat bed without using bedrails?: A Little Help needed moving from lying on your back to sitting on the side of a flat bed without using bedrails?: A Little Help needed moving to and from a bed to a chair  (including a wheelchair)?: A Little Help needed standing up from a chair using your arms (e.g., wheelchair or bedside chair)?: A Little Help needed to walk in hospital room?: A Little Help needed climbing 3-5 steps with a railing? : A Lot 6 Click Score: 17    End of Session Equipment Utilized During Treatment: Gait belt Activity Tolerance: Patient tolerated treatment well;Patient limited by pain Patient left: in bed;Other (comment) (with OT at bedside)   PT Visit Diagnosis: Unsteadiness on feet (R26.81);Muscle weakness (generalized) (M62.81);Other symptoms and signs involving the nervous system (R29.898);Pain;Other abnormalities of gait and mobility (R26.89) Pain - Right/Left: Left Pain - part of body: Hip    Time: 9937-1696 PT Time Calculation (min) (ACUTE ONLY): 17 min   Charges:   PT Evaluation $PT Eval Moderate Complexity: 1 Mod PT Treatments $Therapeutic Activity: 8-22 mins      Lieutenant Diego PT, DPT 12:38 PM,03/02/21

## 2021-03-02 NOTE — Evaluation (Signed)
Occupational Therapy Evaluation Patient Details Name: [name] MRN: [id number] DOB: [birth year] Today's Date: 03/02/2021    History of Present Illness Pt is an 87 y.o. male with PMHx significant of HTN, HLD, COPD, GERD, hypothyroidism, depression, anxiety, atrial fibrillation on Eliquis, OSA on CPAP, prostate cancer, Lewy body dementia, CAD, CABG, adenocarcinoma of the sigmoid colon, AAA (s/p of repair), anemia, compression fracture and vertebroplasty at T12, L3, L4, L5, recent fall resulting in Rt inferior pubic ramus fracture, L hip fracture with ORIF repair (BLE WBAT) and L distal radial fracture with ORIF repair (LUE NWBing) after fall (admission 01/24/21-02/04/21, d/c to SNF for rehab), who presents with dysuria, burning on urination, and hematuria.   Clinical Impression   Pt was seen for OT evaluation this date. Pt received after PT evaluation, pleasant, and able to follow commands well with PRN cues. Prior to hospital admission, pt was receiving therapy at short term rehab (SNF) after recent admission for falls and surgical repair of L hip fx and L distal radial fx (precautions below). Currently pt demonstrates impairments as described below (See OT problem list) which functionally limit his ability to perform ADL/self-care tasks and ADL mobility, requiring increased assist from baseline.  Pt would benefit from skilled OT services to address noted impairments and functional limitations (see below for any additional details) in order to maximize safety and independence while minimizing falls risk and caregiver burden. Upon hospital discharge, recommend HHOT to maximize pt safety and return to functional independence during meaningful occupations of daily life.     Follow Up Recommendations  Home health OT;Supervision/Assistance - 24 hour    Equipment Recommendations  Other (comment) (TBD at next level, may benefit from [facility] if he does not already have elevated commode)    Recommendations  for Other Services       Precautions / Restrictions Precautions Precautions: Fall Restrictions Weight Bearing Restrictions: Yes LUE Weight Bearing: Non weight bearing LLE Weight Bearing: Weight bearing as tolerated      Mobility Bed Mobility Overal bed mobility: Needs Assistance Bed Mobility: Sit to Supine     Supine to sit: Min assist Sit to supine: Min assist   General bed mobility comments: light minA for LLE - OT observed pt perform with PT at end of their session    Transfers Overall transfer level: Needs assistance Equipment used: Rolling walker (2 wheeled) (with L platform attachment) Transfers: Sit to/from Stand Sit to Stand: Min guard;From elevated surface         General transfer comment: cues for hand placement    Balance Overall balance assessment: Needs assistance Sitting-balance support: Feet supported;Single extremity supported Sitting balance-Leahy Scale: Fair     Standing balance support: Bilateral upper extremity supported Standing balance-Leahy Scale: Poor Standing balance comment: reliant on RW support                           ADL either performed or assessed with clinical judgement   ADL Overall ADL's : Needs assistance/impaired Eating/Feeding: Sitting;Set up;Supervision/ safety Eating/Feeding Details (indicate cue type and reason): cues for initiating, assist for set up, sup for safety Grooming: Bed level;Sitting;Set up;Wash/dry face;Minimal assistance Grooming Details (indicate cue type and reason): long sitting on stretcher, set up and Min A for thoroughness for grooming tasks                               General ADL  Comments: Anticipate MIN A for toilet transfers, MIN A for UB ADL, MOD A for LB ADL     Vision Baseline Vision/History: Wears glasses Patient Visual Report: No change from baseline       Perception     Praxis      Pertinent Vitals/Pain Pain Assessment: No/denies pain Faces Pain Scale:  Hurts whole lot Pain Location: L hip Pain Descriptors / Indicators: Moaning;Grimacing;Guarding Pain Intervention(s): Limited activity within patient's tolerance;Repositioned;Monitored during session     Hand Dominance     Extremity/Trunk Assessment Upper Extremity Assessment Upper Extremity Assessment: Generalized weakness;LUE deficits/detail LUE Deficits / Details: LUE in cast, denies pain or sensory changes, able to wiggle fingers and partially grip LUE: Unable to fully assess due to immobilization   Lower Extremity Assessment Lower Extremity Assessment: Defer to PT evaluation;Generalized weakness   Cervical / Trunk Assessment Cervical / Trunk Assessment: Normal   Communication Communication Communication: HOH   Cognition Arousal/Alertness: Awake/alert Behavior During Therapy: WFL for tasks assessed/performed Overall Cognitive Status: History of cognitive impairments - at baseline                                 General Comments: oriented to self, follows simple commands with cues, pleasant   General Comments       Exercises     Shoulder Instructions      Home Living Family/patient expects to be discharged to:: Assisted living                             Home Equipment: Walker - 4 wheels   Additional Comments: transitioned to memory care (mid March)      Prior Functioning/Environment Level of Independence: Needs assistance        Comments: pt recently at SNF, per chart pt was to discharge back to home place but came to ED for assessment        OT Problem List: Decreased strength;Decreased range of motion;Decreased cognition;Decreased activity tolerance;Impaired balance (sitting and/or standing);Decreased knowledge of use of DME or AE;Impaired UE functional use      OT Treatment/Interventions: Self-care/ADL training;Therapeutic exercise;Therapeutic activities;DME and/or AE instruction;Patient/family education;Energy  conservation;Cognitive remediation/compensation;Balance training    OT Goals(Current goals can be found in the care plan section) Acute Rehab OT Goals Patient Stated Goal: get better and go home OT Goal Formulation: With patient Time For Goal Achievement: 03/16/21 Potential to Achieve Goals: Good ADL Goals Pt Will Perform Upper Body Dressing: with min assist;sitting Pt Will Perform Lower Body Dressing: with mod assist;sit to/from stand Pt Will Transfer to Toilet: with min guard assist;ambulating;bedside commode (LRAD for amb) Additional ADL Goal #1: Pt will perform self feeding in seated position with PRN VC/assist to initiate task.  OT Frequency: Min 1X/week   Barriers to D/C:            Co-evaluation              AM-PAC OT "6 Clicks" Daily Activity     Outcome Measure Help from another person eating meals?: A Little Help from another person taking care of personal grooming?: A Little Help from another person toileting, which includes using toliet, bedpan, or urinal?: A Little Help from another person bathing (including washing, rinsing, drying)?: A Lot Help from another person to put on and taking off regular upper body clothing?: A Little Help from another person to put on and  taking off regular lower body clothing?: A Lot 6 Click Score: 16   End of Session    Activity Tolerance: Patient tolerated treatment well Patient left: in bed;with call bell/phone within reach  OT Visit Diagnosis: Other abnormalities of gait and mobility (R26.89);Repeated falls (R29.6);Muscle weakness (generalized) (M62.81)                Time: 0814-4818 OT Time Calculation (min): 10 min Charges:  OT General Charges $OT Visit: 1 Visit OT Evaluation $OT Eval Low Complexity: 1 Low  Hanley Hays, MPH, MS, OTR/L ascom [phone] 03/02/21, 1:09 PM

## 2021-03-02 NOTE — ED Notes (Signed)
Patient is resting comfortably.No acute distress noted.

## 2021-03-02 NOTE — ED Notes (Signed)
Daughter in law at bedside

## 2021-03-03 ENCOUNTER — Inpatient Hospital Stay: Payer: Medicare Other

## 2021-03-03 LAB — CBC
HCT: 24.9 % — ABNORMAL LOW (ref 39.0–52.0)
Hemoglobin: 8.1 g/dL — ABNORMAL LOW (ref 13.0–17.0)
MCH: 31 pg (ref 26.0–34.0)
MCHC: 32.5 g/dL (ref 30.0–36.0)
MCV: 95.4 fL (ref 80.0–100.0)
Platelets: 179 10*3/uL (ref 150–400)
RBC: 2.61 MIL/uL — ABNORMAL LOW (ref 4.22–5.81)
RDW: 15.6 % — ABNORMAL HIGH (ref 11.5–15.5)
WBC: 7.5 10*3/uL (ref 4.0–10.5)
nRBC: 0 % (ref 0.0–0.2)

## 2021-03-03 MED ORDER — MAGIC MOUTHWASH
15.0000 mL | Freq: Four times a day (QID) | ORAL | Status: DC
  Administered 2021-03-03 – 2021-03-05 (×7): 15 mL via ORAL
  Filled 2021-03-03 (×5): qty 20
  Filled 2021-03-03: qty 15
  Filled 2021-03-03 (×6): qty 20

## 2021-03-03 MED ORDER — BENZOCAINE 10 % MT GEL
1.0000 "application " | Freq: Three times a day (TID) | OROMUCOSAL | Status: DC
  Administered 2021-03-03 – 2021-03-05 (×7): 1 via OROMUCOSAL
  Filled 2021-03-03: qty 9

## 2021-03-03 NOTE — Progress Notes (Signed)
PROGRESS NOTE    DENARD TUMINELLO   PJK:[id number]  DOB: 1934/04/25  PCP: Monico Blitz, NP    DOA: 03/01/2021 LOS: 1   Assessment & Plan   Principal Problem:   UTI (urinary tract infection) Active Problems:   Essential hypertension   Hyperlipidemia with target LDL less than 70   OSA on CPAP   PAF (paroxysmal atrial fibrillation) (HCC) CHA2DS2-VASc = 4. AC = Eliquis   COPD (chronic obstructive pulmonary disease) (HCC)   Normocytic anemia   Prostate cancer (HCC)   Hematuria   Dementia without behavioral disturbance (HCC)   CAD (coronary artery disease)   Depression with anxiety   Acute cystitis with hematuria   Acute cystitis with hematuria -failed outpatient Macrobid.  Continue IV Rocephin.  Follow-up urine culture - growing E coli, await susceptibilities.  Monitor hemoglobin given hematuria.  Consider urology input but suspect this bleeding is due to hemorrhagic cystitis.  Hold Eliquis.  Soft BP's Hx of Essential hypertension -continue home lisinopril, metoprolol.  Hydralazine as needed 7/3 AM held metop and lisinopril with BP 90's/50's. Monitor BP closely.  Hypokalemia, LDL goal less than 70 -continue Crestor, Zetia  OSA -on CPAP  Paroxysmal A. Fib - CHA2DS2-VASc =4.  Rate controlled.  Eliquis on hold due to hematuria.  Continue metoprolol.  COPD -stable.  Bronchodilators  Chronic normocytic anemia -hemoglobin stable.  Monitor CBCs.  History of prostate cancer -continue Proscar and Myrbetriq  Dementia without behavioral disturbance -continue Aricept.  Delirium precautions.  Depression with anxiety -continue home Ativan as needed, Remeron, trazodone at bedtime  Dry eyes -Liquifilm Tears at bedtime  Allergic rhinitis -continue Flonase    Patient BMI: Body mass index is 19.79 kg/m.   DVT prophylaxis: SCDs Start: 03/01/21 2311   Diet:  Diet Orders (From admission, onward)     Start     Ordered   03/02/21 0955  DIET DYS 2 Room service appropriate? Yes  with Assist; Fluid consistency: Thin  Diet effective now       Comments: Extra Gravy on meats, potatoes.  Yogurt, puddings.   NO STRAWS!!  Question Answer Comment  Room service appropriate? Yes with Assist   Fluid consistency: Thin      03/02/21 0955              Code Status: DNR   Brief Narrative / Hospital Course to Date:   87 y.o. male with medical history significant of hypertension, hyperlipidemia, COPD, GERD, hypothyroidism, depression, anxiety, atrial fibrillation on Eliquis, OSA on CPAP, prostate cancer, Lewy body dementia, CAD, CABG, adenocarcinoma of the sigmoid colon, AAA (s/p of repair), anemia, who presented with dysuria, burning on urination.  From SNF for rehab after admission 5/26-6/6 for left hip fracture.  Started on Macrobid at the facility 6/29 but had persistent symptoms and developed hematuria.  Admitted for management of Acute Cystitis with Hematuria having failed outpatient oral antibiotic treatment.   Subjective 03/03/21    Patient seen with daughter at bedside today.  He was up in recliner.  Reports some improvement with dysuria.  Having mouth pain from low denture which poorly fits, requesting Orajel.  Daughter states has dental appt schedule in about a month.    Daughter states want patient to return to his prior residence at [facility], not [facility].     Disposition Plan & Communication   Status is: Inpatient  Remains inpatient appropriate because: UTI with hematuria failed outpatient therapy, warrants IV antibiotics until culture results, monitoring of Hbg with bleeding.  Dispo: The patient is from: SNF              Anticipated d/c is to: SNF              Patient currently is not medically stable to d/c.   Difficult to place patient No      Consults, Procedures, Significant Events   Consultants:  None  Procedures:  None  Antimicrobials:  Anti-infectives (From admission, onward)    Start     Dose/Rate Route Frequency Ordered Stop   03/02/21  1500  cefTRIAXone (ROCEPHIN) 1 g in sodium chloride 0.9 % 100 mL IVPB        1 g 200 mL/hr over 30 Minutes Intravenous Every 24 hours 03/01/21 1612     03/01/21 1500  cefTRIAXone (ROCEPHIN) 1 g in sodium chloride 0.9 % 100 mL IVPB        1 g 200 mL/hr over 30 Minutes Intravenous  Once 03/01/21 1456 03/01/21 1520         Micro    Objective   Vitals:   03/03/21 0601 03/03/21 0725 03/03/21 0903 03/03/21 1509  BP: 112/64 111/64 (!) 92/54 110/67  Pulse: 65 70 71 89  Resp: 18 14  20   Temp: 97.8 F (36.6 C) 97.9 F (36.6 C)  98.9 F (37.2 C)  TempSrc:      SpO2: 100% 99%  100%  Weight:      Height:        Intake/Output Summary (Last 24 hours) at 03/03/2021 1825 Last data filed at 03/02/2021 2350 Gross per 24 hour  Intake --  Output 425 ml  Net -425 ml   Filed Weights   03/01/21 1155  Weight: 66.2 kg    Physical Exam:  General exam: awake, alert, no acute distress Respiratory system: CTAB, no wheezes, rales or rhonchi, normal respiratory effort. Cardiovascular system: normal S1/S2, RRR, no pedal edema.   Genitourinary: dark red hematuria in bag, no flank tenderness Central nervous system: A&O. no gross focal neurologic deficits, normal speech Extremities: left forearm cast in place with erythema and swelling of the index MCP joint, no LE edema Psychiatry: normal mood, congruent affect, normal judgment and insight  Labs   Data Reviewed: I have personally reviewed following labs and imaging studies  CBC: Recent Labs  Lab 03/01/21 1211 03/02/21 0701 03/03/21 0441  WBC 8.3 8.2 7.5  NEUTROABS 5.3  --   --   HGB 8.9* 9.3* 8.1*  HCT 27.9* 29.0* 24.9*  MCV 98.2 94.2 95.4  PLT 181 193 081   Basic Metabolic Panel: Recent Labs  Lab 03/01/21 1211  NA 136  K 4.1  CL 104  CO2 29  GLUCOSE 106*  BUN 16  CREATININE 0.81  CALCIUM 8.6*   GFR: Estimated Creatinine Clearance: 62.4 mL/min (by C-G formula based on SCr of 0.81 mg/dL). Liver Function Tests: No results  for input(s): AST, ALT, ALKPHOS, BILITOT, PROT, ALBUMIN in the last 168 hours. No results for input(s): LIPASE, AMYLASE in the last 168 hours. No results for input(s): AMMONIA in the last 168 hours. Coagulation Profile: No results for input(s): INR, PROTIME in the last 168 hours. Cardiac Enzymes: No results for input(s): CKTOTAL, CKMB, CKMBINDEX, TROPONINI in the last 168 hours. BNP (last 3 results) No results for input(s): PROBNP in the last 8760 hours. HbA1C: No results for input(s): HGBA1C in the last 72 hours. CBG: No results for input(s): GLUCAP in the last 168 hours. Lipid Profile: No results for input(s):  CHOL, HDL, LDLCALC, TRIG, CHOLHDL, LDLDIRECT in the last 72 hours. Thyroid Function Tests: No results for input(s): TSH, T4TOTAL, FREET4, T3FREE, THYROIDAB in the last 72 hours. Anemia Panel: No results for input(s): VITAMINB12, FOLATE, FERRITIN, TIBC, IRON, RETICCTPCT in the last 72 hours. Sepsis Labs: No results for input(s): PROCALCITON, LATICACIDVEN in the last 168 hours.  Recent Results (from the past 240 hour(s))  Resp Panel by RT-PCR (Flu A&B, Covid) Nasopharyngeal Swab     Status: None   Collection Time: 03/01/21 11:41 AM   Specimen: Nasopharyngeal Swab; Nasopharyngeal(NP) swabs in vial transport medium  Result Value Ref Range Status   SARS Coronavirus 2 by RT PCR NEGATIVE NEGATIVE Final    Comment: (NOTE) SARS-CoV-2 target nucleic acids are NOT DETECTED.  The SARS-CoV-2 RNA is generally detectable in upper respiratory specimens during the acute phase of infection. The lowest concentration of SARS-CoV-2 viral copies this assay can detect is 138 copies/mL. A negative result does not preclude SARS-Cov-2 infection and should not be used as the sole basis for treatment or other patient management decisions. A negative result may occur with  improper specimen collection/handling, submission of specimen other than nasopharyngeal swab, presence of viral mutation(s)  within the areas targeted by this assay, and inadequate number of viral copies(<138 copies/mL). A negative result must be combined with clinical observations, patient history, and epidemiological information. The expected result is Negative.  Fact Sheet for Patients:  EntrepreneurPulse.com.au  Fact Sheet for Healthcare Providers:  IncredibleEmployment.be  This test is no t yet approved or cleared by the [place] FDA and  has been authorized for detection and/or diagnosis of SARS-CoV-2 by FDA under an Emergency Use Authorization (EUA). This EUA will remain  in effect (meaning this test can be used) for the duration of the COVID-19 declaration under Section 564(b)(1) of the Act, 21 U.S.C.section 360bbb-3(b)(1), unless the authorization is terminated  or revoked sooner.       Influenza A by PCR NEGATIVE NEGATIVE Final   Influenza B by PCR NEGATIVE NEGATIVE Final    Comment: (NOTE) The Xpert Xpress SARS-CoV-2/FLU/RSV plus assay is intended as an aid in the diagnosis of influenza from Nasopharyngeal swab specimens and should not be used as a sole basis for treatment. Nasal washings and aspirates are unacceptable for Xpert Xpress SARS-CoV-2/FLU/RSV testing.  Fact Sheet for Patients: EntrepreneurPulse.com.au  Fact Sheet for Healthcare Providers: IncredibleEmployment.be  This test is not yet approved or cleared by the [place] FDA and has been authorized for detection and/or diagnosis of SARS-CoV-2 by FDA under an Emergency Use Authorization (EUA). This EUA will remain in effect (meaning this test can be used) for the duration of the COVID-19 declaration under Section 564(b)(1) of the Act, 21 U.S.C. section 360bbb-3(b)(1), unless the authorization is terminated or revoked.  Performed at [facility], [address] [place], [place] [address]   Urine culture     Status: Abnormal  (Preliminary result)   Collection Time: 03/01/21  2:00 PM   Specimen: Urine, Random  Result Value Ref Range Status   Specimen Description   Final    URINE, RANDOM Performed at [facility], [address], [place], [place] [address]    Special Requests   Final    NONE Performed at [facility], [address] [place], [place] [address]    Culture (A)  Final    >=100,000 COLONIES/mL ESCHERICHIA COLI SUSCEPTIBILITIES TO FOLLOW Performed at [facility], [place] [address] [place], [place] [address]  Report Status PENDING  Incomplete      Imaging Studies   DG Wrist 2 Views Left  Result Date: 03/03/2021 CLINICAL DATA:  ORIF LEFT wrist fracture. EXAM: LEFT WRIST - 2 VIEW COMPARISON:  01/29/2021 FINDINGS: Fiberglass cast obscure some detail. Internal plate and screw fixation of the distal radius is noted traversing a distal diaphyseal fracture, in near-anatomic alignment and position. A remote distal ulnar fracture is again noted. No gross complicating features are noted. IMPRESSION: ORIF of the distal radial fracture, in near-anatomic alignment and position. Electronically Signed   By: Margarette Canada M.D.   On: 03/03/2021 14:04     Medications   Scheduled Meds:  vitamin C  500 mg Oral BID   bacitracin  1 application Topical QPM   benzocaine  1 application Mouth/Throat TID PC & HS   cholecalciferol  2,000 Units Oral Daily   dextromethorphan-guaiFENesin  1 tablet Oral BID   donepezil  10 mg Oral QHS   ezetimibe  10 mg Oral Daily   feeding supplement (PRO-STAT SUGAR FREE 64)  30 mL Oral BID   finasteride  5 mg Oral Daily   lisinopril  2.5 mg Oral BID   magic mouthwash  15 mL Oral QID   magnesium oxide  400 mg Oral QPM   metoprolol tartrate  25 mg Oral BID   mirabegron ER  50 mg Oral Daily   mirtazapine  7.5 mg Oral QHS   mometasone-formoterol  2 puff Inhalation BID   multivitamin with minerals  1 tablet Oral Daily   polyvinyl alcohol  1 drop Both Eyes QHS    rosuvastatin  20 mg Oral Daily   traZODone  50 mg Oral QHS   Continuous Infusions:  cefTRIAXone (ROCEPHIN)  IV 1 g (03/03/21 1614)       LOS: 1 day    Time spent: 25 minutes    Ezekiel Slocumb, DO [facility]  03/03/2021, 6:25 PM      If 7PM-7AM, please contact night-coverage. How to contact the [facility] Attending or Consulting provider [place] or covering provider during after hours [place], for this patient?    Check the care team in [facility] and look for a) attending/consulting [facility] provider listed and b) the [facility] team listed Log into [email or web address] and use [place]'s universal password to access. If you do not have the password, please contact the hospital operator. Locate the [facility] provider you are looking for under [facility] and page to a number that you can be directly reached. If you still have difficulty reaching the provider, please page the [facility] (Director on Call) for the Hospitalists listed on amion for assistance.

## 2021-03-04 DIAGNOSIS — I480 Paroxysmal atrial fibrillation: Secondary | ICD-10-CM

## 2021-03-04 LAB — URINE CULTURE: Culture: >=100000 — AB

## 2021-03-04 LAB — HEMOGLOBIN AND HEMATOCRIT, BLOOD
HCT: 25.4 % — ABNORMAL LOW (ref 39.0–52.0)
Hemoglobin: 8.5 g/dL — ABNORMAL LOW (ref 13.0–17.0)

## 2021-03-04 MED ORDER — CEPHALEXIN 500 MG PO CAPS
500.0000 mg | ORAL_CAPSULE | Freq: Two times a day (BID) | ORAL | Status: DC
  Administered 2021-03-05: 500 mg via ORAL
  Filled 2021-03-04: qty 1

## 2021-03-04 MED ORDER — ENSURE ENLIVE PO LIQD
237.0000 mL | Freq: Three times a day (TID) | ORAL | Status: DC
  Administered 2021-03-04 – 2021-03-05 (×3): 237 mL via ORAL

## 2021-03-04 NOTE — Consult Note (Addendum)
ORTHOPAEDIC CONSULTATION  REQUESTING PHYSICIAN: Ezekiel Slocumb, DO  Chief Complaint:   Status post ORIF metadiaphyseal fracture, left distal radius.  History of Present Illness: [name] is a 87 y.o. male with multiple medical problems including hypertension, coronary artery disease, COPD, dementia, osteoporosis, and paroxysmal atrial fibrillation who is now 5 weeks status post an open reduction and internal fixation of a distal radial metadiaphyseal fracture which occurred just proximal to a short distal radial fracture plate placed previously for a distal radius fracture in May, 2020.  This most recent procedure was performed by Dr. Mack Guise on 01/29/2021.    The patient apparently developed a urinary tract infection at the skilled nursing facility and was readmitted on 03/01/2021 for treatment of this infection.  He was noted to have some irritation around the index metacarpal from his cast, prompting this orthopedic consultation.  The patient denies any reinjury to the wrist, and denies any numbness or paresthesias to his fingers.  Past Medical History:  Diagnosis Date   AAA (abdominal aortic aneurysm) ([place]) 05/2019   Relativley Rapid progression from June-Aug 2020 (4.6 - 5.4 cm): s/p EVAR (Dr. Lucky Cowboy [facility]): EVAR - 23 mm prox, 12 cm distal & x 18 cm length Gore Excluder Endoprosthesis Main body (via RFA distal to lowest Renal A); 84m x 14 cm L Contralateral Limb for L Iliac - extended with 8 mm x 6cm LifeStar stent (post-dilated with 7 mm DEB).  Additional R Iliac PTA not required.    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 COLON POLYP X 2, ASCENDING; COLD SNARE:  - TUBULAR ADENOMA.  - NEGATIVE FOR HIGH-GRADE DYSPLASIA AND MALIGNANCY.  - NO ADDITIONAL TISSUE IDENTIFIED; FECAL MATERIAL PRESENT.   B. COLON POLYP X 2, DESCENDING; COLD SNARE:  - ADENOCARCINOMA ARISING IN A DYSPLASTIC SESSILE  SERRATED ADENOMA, WITH  LYMPHOVASCULAR INVASION, SEE COMMENT.  - TUBULAR ADENOMA, 2 FRAGMENTS, NEGATIVE FO   Arthritis    Basal cell carcinoma of eyelid 01/03/2013   2019 - R side of Nose   Benign neoplasm of ascending colon    Benign neoplasm of descending colon    Bilateral iliac artery stenosis ([place] 2007   Bilateral Iliac A stenting; extension of EVAR limbs into both Iliacs -- additional stent placed in L Iliac.   Cancer ([facility]    Skin cancer   Colon polyps    Complication of anesthesia    severe confusion agitation requiring hospital admission x 4 days   Compression fracture of fourth lumbar vertebra ([place] 04/2019   - s/p Kyphoplasty; T12, L3-5 ([facility]   COPD (chronic obstructive pulmonary disease) (HCC)    Centrilobular Emphysema   Coronary artery disease involving native coronary artery without angina pectoris 1998   - s/p CABG, occluded SVG-D1; patent LIMA-LAD, SVG-OM, SVG- RCA   Dementia ([place]    memory loss - started noting late 2019   Falls frequently    broke foot   Fracture 2021   left foot   GERD (gastroesophageal reflux disease)    Hemorrhoid 02/10/2015   History of hiatal hernia    History of kidney stones    HOH (hard of hearing)    Hypertension    Lewy body dementia ([place] 04/04/2020   Macular degeneration disease    Osteoporosis    PAF (paroxysmal atrial fibrillation) (HCC)    on Eliquis for OAC - Rate Control    Prostate cancer (HCC)    Prostate disorder    Sciatic  pain    Chronic   Sleep apnea    cpap   Temporary cerebral vascular dysfunction 02/10/2015   Had negative work-up.  July, 2012.  No medication changes, had forgot them that day, got dehydrated.    Urinary incontinence    Past Surgical History:  Procedure Laterality Date   ABDOMINAL AORTIC ENDOVASCULAR STENT GRAFT Bilateral 05/18/2019   Procedure: ABDOMINAL AORTIC ENDOVASCULAR STENT GRAFT;  Surgeon: Algernon Huxley, MD;  Location: [facility] ORS;  Service: Vascular;  Laterality: Bilateral;   ANKLE  SURGERY Left    ORIF    Arch aortogram and carotid aortogram  06/02/2006   Dr Oneida Alar did surgery   BASAL CELL CARCINOMA EXCISION  04/2016   Dermatology   CARDIAC CATHETERIZATION  01/30/1998    (Dr. Linard Millers): Native LAD & mRCA CTO. 90% D1. Mod Cx. SVG-D1 CTO. SVG-RCA & SVG-OM along with LIMA-LAD patent;  LV NORMAL Fxn   CAROTID ENDARTERECTOMY Left Oct. 29, 2007   Dr. Oneida Alar:   CATARACT EXTRACTION W/PHACO Right 03/05/2017   Procedure: CATARACT EXTRACTION PHACO AND INTRAOCULAR LENS PLACEMENT ([place]);  Surgeon: Leandrew Koyanagi, MD;  Location: [facility] ORS;  Service: Ophthalmology;  Laterality: Right;  [place] 00:58.5 AP% 10.6 CDE 6.20 Fluid Pack lot # [id number]   CATARACT EXTRACTION W/PHACO Left 04/15/2017   Procedure: CATARACT EXTRACTION PHACO AND INTRAOCULAR LENS PLACEMENT ([place])  Left;  Surgeon: Leandrew Koyanagi, MD;  Location: [place];  Service: Ophthalmology;  Laterality: Left;   COLONOSCOPY WITH PROPOFOL N/A 09/15/2017   Procedure: COLONOSCOPY WITH PROPOFOL;  Surgeon: Lucilla Lame, MD;  Location[facility] ENDOSCOPY;  Service: Endoscopy;  Laterality: N/A;   COLONOSCOPY WITH PROPOFOL N/A 10/26/2018   Procedure: COLONOSCOPY WITH PROPOFOL;  Surgeon: Lucilla Lame, MD;  Location: [facility] ENDOSCOPY;  Service: Endoscopy;  Laterality: N/A;   CORONARY ARTERY BYPASS GRAFT  1998   LIMA-LAD, SVG-OM, SVG-RPDA, SVG-DIAG   CPET / MET  12/2012   Mild chronotropic incompetence - read 82% of predicted; also reduced effort; peak VO2 15.7 / 75% (did not reach Max effort) -- suggested ischemic response in last 1.5 minutes of exercise. Normal pulmonary function on PFTs but poor response to   CYSTOSCOPY W/ RETROGRADES Left 01/13/2020   Procedure: CYSTOSCOPY WITH RETROGRADE PYELOGRAM;  Surgeon: Billey Co, MD;  Location: [facility] ORS;  Service: Urology;  Laterality: Left;   CYSTOSCOPY WITH STENT PLACEMENT Left 12/26/2019   Procedure: CYSTOSCOPY WITH STENT PLACEMENT;  Surgeon: Billey Co, MD;  Location: [facility]  ORS;  Service: Urology;  Laterality: Left;   CYSTOSCOPY/URETEROSCOPY/HOLMIUM LASER/STENT PLACEMENT Left 01/13/2020   Procedure: CYSTOSCOPY/URETEROSCOPY/HOLMIUM LASER/STENT EXCHANGE;  Surgeon: Billey Co, MD;  Location: [facility] ORS;  Service: Urology;  Laterality: Left;   ENDOVASCULAR REPAIR/STENT GRAFT N/A 05/18/2019   Procedure: ENDOVASCULAR REPAIR/STENT GRAFT;  Surgeon: Algernon Huxley, MD;  Location: [facility] INVASIVE CV LAB;; EVAR - 23 mm prox, 12 cm distal & x 18 cm length Gore Excluder Endoprosthesis Main body (via RFA distal to lowest Renal A); 63m x 14 cm L Contralateral Limb for L Iliac - extended with 8 mm x 6cm LifeStar stent; no additional R Iliac PTA needed.    ESOPHAGOGASTRODUODENOSCOPY (EGD) WITH PROPOFOL N/A 09/15/2017   Procedure: ESOPHAGOGASTRODUODENOSCOPY (EGD) WITH PROPOFOL;  Surgeon: WLucilla Lame MD;  [facility];  Service: Endoscopy;  Laterality: N/A;   FRACTURE SURGERY Right 03/19/2015   wrist   FRACTURE SURGERY Left    HIATAL HERNIA REPAIR     ILIAC ARTERY STENT  12/15/2005   PTA and  direct stenting rgt and lft common iliac arteries   INCISION AND DRAINAGE OF WOUND Left 01/29/2021   Procedure: IRRIGATION AND DEBRIDEMENT Left Wrist and Removal of Plate;  Surgeon: Thornton Park, MD;  Location: [facility] ORS;  Service: Orthopedics;  Laterality: Left;   INTRAMEDULLARY (IM) NAIL INTERTROCHANTERIC Left 01/29/2021   Procedure: INTRAMEDULLARY (IM) NAIL INTERTROCHANTRIC;  Surgeon: Thornton Park, MD;  Location: [facility] ORS;  Service: Orthopedics;  Laterality: Left;   KYPHOPLASTY N/A 04/05/2019   Procedure: KYPHOPLASTY T12, L3, L4 L5;  Surgeon: Hessie Knows, MD;  Location: [facility] ORS;  Service: Orthopedics;  Laterality: N/A;   NM GATED MYOVIEW ([place] [facility])  05/'19; 8/'20   a) [facility]: Low Risk - no ischemia or infarct.  EF > 65%. no RWMA;; b) [facility]): Normal wall motion.  No ischemia or infarction.   OPEN REDUCTION INTERNAL FIXATION (ORIF) DISTAL RADIAL FRACTURE Left 01/13/2019    Procedure: OPEN REDUCTION INTERNAL FIXATION (ORIF) DISTAL  RADIAL FRACTURE - LEFT - SLEEP APNEA;  Surgeon: Hessie Knows, MD;  Location: [facility] ORS;  Service: Orthopedics;  Laterality: Left;   OPEN REDUCTION INTERNAL FIXATION (ORIF) DISTAL RADIAL FRACTURE Left 01/29/2021   Procedure: OPEN REDUCTION INTERNAL FIXATION (ORIF) DISTAL RADIAL FRACTURE;  Surgeon: Thornton Park, MD;  Location: [facility] ORS;  Service: Orthopedics;  Laterality: Left;   ORIF ELBOW FRACTURE Right 01/01/2017   Procedure: OPEN REDUCTION INTERNAL FIXATION (ORIF) ELBOW/OLECRANON FRACTURE;  Surgeon: Hessie Knows, MD;  Location: [facility] ORS;  Service: Orthopedics;  Laterality: Right;   ORIF WRIST FRACTURE Right 03/19/2015   Procedure: OPEN REDUCTION INTERNAL FIXATION (ORIF) WRIST FRACTURE;  Surgeon: Hessie Knows, MD;  Location: [facility] ORS;  Service: Orthopedics;  Laterality: Right;   SHOULDER ARTHROSCOPY Right    THORACIC AORTA - CAROTID ANGIOGRAM  October 2007   Dr. 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 Dilated left atrium.   TRANSTHORACIC ECHOCARDIOGRAM  04/2019   [facility] April 20, 2019): EF 55-60 %.  Mild LVH.  Relatively normal valves.   Social History   Socioeconomic History   Marital status: Widowed    Spouse name: Not on file   Number of children: 1   Years of education: Not on file   Highest education level: Some college, no degree  Occupational History   Occupation: retired  Tobacco Use   Smoking status: Former    Pack years: 0.00    Types: Cigarettes    Quit date: 08/04/1997    Years since quitting: 23.5   Smokeless tobacco: Never  Vaping Use   Vaping Use: Never used  Substance and Sexual Activity   Alcohol use: No   Drug use: No   Sexual activity: Not Currently    Birth control/protection: None  Other Topics Concern   Not on file  Social  History Narrative   Resides at [facility] since 06/2018   He is a widowed father of one, grandfather of 20 with 3 stepchildren.    Now accompanied by his long-term significant other who lives just a few minutes away. .    She accompanies him with just not any clinic visit or hospitalization.  She also helps make sure he is taking his medication.   He has become more debilitated since his EVAR last September and has had more gait instability with balance issues.   He is not nearly as active as he used to be.  Really got deconditioned and has taken a long time to recover.   Currently undergoing physical therapy, Occupational Therapy treatments.      He has changed his CODE STATUS to DNR.   Social Determinants of Health   Financial Resource Strain: Low Risk    Difficulty of Paying Living Expenses: Not hard at all  Food Insecurity: No Food Insecurity   Worried About Charity fundraiser in the Last Year: Never true   [place] in the Last Year: Never true  Transportation Needs: No Transportation Needs   Lack of Transportation (Medical): No   Lack of Transportation (Non-Medical): No  Physical Activity: Inactive   Days of Exercise per Week: 0 days   Minutes of Exercise per Session: 0 min  Stress: No Stress Concern Present   Feeling of Stress : Only a little  Social Connections: Socially Isolated   Frequency of Communication with Friends and Family: More than three times a week   Frequency of Social Gatherings with Friends and Family: More than three times a week   Attends Religious Services: Never   Marine scientist or Organizations: No   Attends Archivist Meetings: Never   Marital Status: Widowed   Family History  Problem Relation Age of Onset   Ulcers Mother        Peptic   Dementia Mother    Alcohol abuse Father    Allergies  Allergen Reactions   Clonazepam Other (See Comments)    Altered mental status   Codeine Other (See Comments)    Altered mental  status.   Ropinirole Swelling    Other reaction(s): Hallucination   Hydrocodone     confusion   Niacin And Related Other (See Comments)    Flushing of skin   Oxycodone Other (See Comments)    confusion confusion   Tramadol     Other reaction(s): Hallucination   Prior to Admission medications   Medication Sig Start Date End Date Taking? Authorizing Provider  acetaminophen (TYLENOL) 500 MG tablet Take 500-1,000 mg by mouth every 6 (six) hours as needed for mild pain or moderate pain.   Yes [provider]  Amino Acids-Protein Hydrolys (FEEDING SUPPLEMENT, PRO-STAT SUGAR FREE 64,) LIQD Take 30 mLs by mouth 2 (two) times daily.   Yes [provider]  bacitracin ointment Apply 1 application topically every evening. Apply to right eyelid topically for 4 weeks in the evening after instillation of Systane eye drops.   Yes [provider]  benzocaine (HURRICAINE) 20 % GEL Use as directed 1 application in the mouth or throat 4 (four) times daily.   Yes [provider]  Cholecalciferol 50 MCG (2000 UT) TBDP Take 2,000 Units by mouth daily.   Yes [provider]  donepezil (ARICEPT) 10 MG tablet Take 10 mg by mouth at bedtime. 12/26/20  Yes [provider]  ELIQUIS 5 MG TABS tablet TAKE ONE TABLET TWICE DAILY 09/03/20  Yes Leonie Man, MD  ezetimibe (ZETIA) 10 MG tablet TAKE 1 TABLET BY MOUTH DAILY 12/21/20  Yes [name], MD  finasteride (PROSCAR) 5 MG tablet TAKE ONE TABLET BY MOUTH EVERY DAY 12/21/20  Yes Stoioff, Ronda Fairly, MD  fluticasone (FLONASE) 50 MCG/ACT nasal spray PLACE TWO SPRAYS IN BOTH NOSRTILS EVERY DAY AS NEEDED FOR ALLERGIES 08/07/20  Yes [name], MD  fluticasone-salmeterol (ADVAIR) 250-50 MCG/ACT AEPB Inhale 1 puff into the lungs in the morning and at bedtime.   Yes  [provider]  lisinopril (ZESTRIL) 2.5 MG tablet TAKE ONE TABLET BY MOUTH TWICE DAILY 10/05/20  Yes [name], MD  magic mouthwash SOLN  Take 30 mLs by mouth 4 (four) times daily.   Yes [provider]  Magnesium Oxide 500 MG TABS Take 500 mg by mouth every evening.   Yes [provider]  metoprolol tartrate (LOPRESSOR) 25 MG tablet Take 25 mg by mouth 2 (two) times daily. 03/30/20  Yes [provider]  mirtazapine (REMERON) 7.5 MG tablet Take 7.5 mg by mouth at bedtime. 12/26/20  Yes [provider]  Multiple Vitamin (MULTIVITAMIN WITH MINERALS) TABS tablet Take 1 tablet by mouth daily.   Yes [provider]  Multiple Vitamins-Minerals (PRESERVISION AREDS 2 PO) Take 1 tablet by mouth 2 (two) times daily.    Yes [provider]  MYRBETRIQ 50 MG TB24 tablet TAKE ONE TABLET BY MOUTH EVERY DAY 12/21/20  Yes Stoioff, Ronda Fairly, MD  nitrofurantoin, macrocrystal-monohydrate, (MACROBID) 100 MG capsule Take 100 mg by mouth 2 (two) times daily. 02/27/21 03/06/21 Yes [provider]  Polyethyl Glycol-Propyl Glycol (SYSTANE) 0.4-0.3 % SOLN Place 1 drop into both eyes at bedtime.   Yes [provider]  rosuvastatin (CRESTOR) 20 MG tablet TAKE ONE TABLET BY MOUTH EVERY DAY Patient taking differently: Take 20 mg by mouth daily. 12/21/20  Yes [name], MD  traZODone (DESYREL) 50 MG tablet Take 50 mg by mouth at bedtime. 12/24/20  Yes [provider]  vitamin C (ASCORBIC ACID) 500 MG tablet Take 500 mg by mouth 2 (two) times daily.   Yes [provider]  LORazepam (ATIVAN) 0.5 MG tablet Take 0.5 mg by mouth every 8 (eight) hours as needed for anxiety or agitation. 12/26/20   [provider]   DG Wrist 2 Views Left  Result Date: 03/03/2021 CLINICAL DATA:  ORIF LEFT wrist fracture. EXAM: LEFT WRIST - 2 VIEW COMPARISON:  01/29/2021 FINDINGS: Fiberglass cast obscure some detail. Internal plate and screw fixation of the distal radius is noted traversing a distal diaphyseal fracture, in near-anatomic alignment and position. A remote distal ulnar fracture is  again noted. No gross complicating features are noted. IMPRESSION: ORIF of the distal radial fracture, in near-anatomic alignment and position. Electronically Signed   By: Margarette Canada M.D.   On: 03/03/2021 14:04    Positive ROS: All other systems have been reviewed and were otherwise negative with the exception of those mentioned in the HPI and as above.  Physical Exam: General:  Alert, no acute distress Psychiatric:  Patient is competent for consent with normal mood and affect   Cardiovascular:  No pedal edema Respiratory:  No wheezing, non-labored breathing GI:  Abdomen is soft and non-tender Skin:  No lesions in the area of chief complaint Neurologic:  Sensation intact distally Lymphatic:  No axillary or cervical lymphadenopathy  Orthopedic Exam:  Orthopedic examination is limited to the left wrist and hand.  The cast appears to be in good shape, but does appear to be somewhat tight around the hand, especially through the first webspace, causing some swelling and erythema to the index MCP joint region.  Therefore, it was elected to remove the cast, given that the patient was now 5 weeks out from his most recent surgery.  After cast removal, the skin was dry but otherwise intact.  His wound appears to be healing well and is without infection.  The Steri-Strips were left in place.  No swelling, erythema, ecchymosis,  abrasions, or other skin abnormalities are noted around the incision site.  He does have some mild swelling and erythema around the MCP joint, but has no pain with active or passive motion of the MCP joint.  He is able actively flex and extend all digits without any pain or triggering.  Wrist motion is expectedly stiff due to his period of immobilization.  He is neurovascular intact to all digits.  X-rays:  AP and lateral x-rays of the left wrist obtained yesterday have been reviewed by myself.  These films demonstrate excellent interval healing of the distal metadiaphyseal radius  fracture.  The hardware is in excellent position and without evidence of loosening.  No new acute bony abnormalities are identified.  Assessment: Status post ORIF of metadiaphyseal fracture, left distal radius.  Plan: After obtaining verbal consent, the patient's cast was removed using the appropriate cast saw and a Velcro wrist immobilizer applied to protect the wrist.  The patient may use his hand as tolerated, but is to limit weightbearing through the wrist at this time.  The patient may be mobilized with physical therapy once cleared medically.  Thank you for asking me to participate in the care of this most pleasant yet unfortunate man.  I will let Dr. Mack Guise know that this patient has been to be admitted so that he may assume further care of this patient as of tomorrow.   Pascal Lux, MD  Beeper #:  [phone]  03/04/2021 12:09 PM

## 2021-03-04 NOTE — Progress Notes (Signed)
Initial Nutrition Assessment  DOCUMENTATION CODES:  Severe malnutrition in context of chronic illness  INTERVENTION:  Continue current diet as ordered per SLP. Ensure Enlive po TID, each supplement provides 350 kcal and 20 grams of protein en Magic cup TID with meals, each supplement provides 290 kcal and 9 grams of protein MVI with minerals daily  NUTRITION DIAGNOSIS:  Severe Malnutrition (in the context of chronic illness) related to poor appetite as evidenced by severe muscle depletion, severe fat depletion, percent weight loss (16.6% x 6 months).  GOAL:  Patient will meet greater than or equal to 90% of their needs  MONITOR:  PO intake, Supplement acceptance, Labs  REASON FOR ASSESSMENT:  Consult Assessment of nutrition requirement/status  ASSESSMENT:  87 y.o. male with PNH of HTN, CAD, GERD, atrial fibrillation, COPD, and dementia who presents to the ED for hematuria from his rehab facility. Recently admitted for femur fracture. Workup in ED suggestive of UTI.    Pt resting in bed at the time of visit, grandson at bedside. Pt reports poor appetite PTA but it is much better now. Noted last two meals have been documented as only "bites" consumed. Edentulous, grandson states pt has dentures but due to a sore in pt's mouth they have not been comfortable recently.   Pt denies any complaints at his time. Agreeable to chocolate flavored supplements. Severe weight loss of 16.6% noted in the last 6 months.    Nutritionally Relevant Medications: Scheduled Meds:  vitamin C  500 mg Oral BID   cholecalciferol  2,000 Units Oral Daily   ezetimibe  10 mg Oral Daily   feeding supplement (PRO-STAT SUGAR FREE 64)  30 mL Oral BID   magnesium oxide  400 mg Oral QPM   mirtazapine  7.5 mg Oral QHS   multivitamin with minerals  1 tablet Oral Daily   rosuvastatin  20 mg Oral Daily   Continuous Infusions:  cefTRIAXone (ROCEPHIN)  IV 1 g (03/03/21 1614)   PRN Meds: ondansetron   Labs  Reviewed  NUTRITION - FOCUSED PHYSICAL EXAM: Flowsheet Row Most Recent Value  Orbital Region Severe depletion  Upper Arm Region Severe depletion  Thoracic and Lumbar Region Severe depletion  Buccal Region Severe depletion  Temple Region Mild depletion  Clavicle Bone Region Severe depletion  Clavicle and Acromion Bone Region Severe depletion  Scapular Bone Region Severe depletion  Dorsal Hand Moderate depletion  Patellar Region Severe depletion  Anterior Thigh Region Severe depletion  Posterior Calf Region Severe depletion  Edema (RD Assessment) None  Hair Reviewed  Eyes Reviewed  Mouth Reviewed  Skin Reviewed  Nails Reviewed   Diet Order:   Diet Order             DIET DYS 2 Room service appropriate? Yes with Assist; Fluid consistency: Thin  Diet effective now                   EDUCATION NEEDS:  No education needs have been identified at this time  Skin:  Skin Assessment: Reviewed RN Assessment  Last BM:  7/4, type 6  Height:  Ht Readings from Last 1 Encounters:  03/01/21 6' (1.829 m)    Weight:  Wt Readings from Last 1 Encounters:  03/01/21 66.2 kg   Ideal Body Weight:  80.9 kg  BMI:  Body mass index is 19.79 kg/m.  Estimated Nutritional Needs:  Kcal:  1900-2100 kcal/d Protein:  95-105 g/d Fluid:  1.9-2.1L/d  Ranell Patrick, RD, LDN Clinical Dietitian Pager on  Amion

## 2021-03-04 NOTE — Progress Notes (Signed)
PROGRESS NOTE    [name]   DDU:[id number]  DOB: 1934-03-21  PCP: Monico Blitz, NP    DOA: 03/01/2021 LOS: 2   Assessment & Plan   Principal Problem:   UTI (urinary tract infection) Active Problems:   Essential hypertension   Hyperlipidemia with target LDL less than 70   OSA on CPAP   PAF (paroxysmal atrial fibrillation) (HCC) CHA2DS2-VASc = 4. AC = Eliquis   COPD (chronic obstructive pulmonary disease) (HCC)   Normocytic anemia   Prostate cancer (HCC)   Hematuria   Dementia without behavioral disturbance (HCC)   CAD (coronary artery disease)   Depression with anxiety   Acute cystitis with hematuria   Acute cystitis with hematuria -failed outpatient Macrobid.  Continue IV Rocephin.  Follow-up urine culture - growing E coli, await susceptibilities.  Monitor hemoglobin given hematuria.  Consider urology input but suspect this bleeding is due to hemorrhagic cystitis.  Hold Eliquis.  Soft BP's - improved. Suspect mild dehydration. Hx of Essential hypertension -continue home lisinopril, metoprolol.  Hydralazine as needed 7/3 AM held metop and lisinopril with BP 90's/50's. Monitor BP closely.  Hypokalemia, LDL goal less than 70 -continue Crestor, Zetia  OSA -on CPAP  Paroxysmal A. Fib - CHA2DS2-VASc =4.  Rate controlled.  Eliquis on hold due to hematuria.  Continue metoprolol.  COPD -stable.  Bronchodilators  Chronic normocytic anemia -hemoglobin stable.  Monitor CBCs.  History of prostate cancer -continue Proscar and Myrbetriq  Dementia without behavioral disturbance -continue Aricept.  Delirium precautions.  Depression with anxiety -continue home Ativan as needed, Remeron, trazodone at bedtime  Dry eyes -Liquifilm Tears at bedtime  Allergic rhinitis -continue Flonase    Patient BMI: Body mass index is 19.79 kg/m.   DVT prophylaxis: SCDs Start: 03/01/21 2311   Diet:  Diet Orders (From admission, onward)     Start     Ordered   03/02/21 0955   DIET DYS 2 Room service appropriate? Yes with Assist; Fluid consistency: Thin  Diet effective now       Comments: Extra Gravy on meats, potatoes.  Yogurt, puddings.   NO STRAWS!!  Question Answer Comment  Room service appropriate? Yes with Assist   Fluid consistency: Thin      03/02/21 0955              Code Status: DNR   Brief Narrative / Hospital Course to Date:   87 y.o. male with medical history significant of hypertension, hyperlipidemia, COPD, GERD, hypothyroidism, depression, anxiety, atrial fibrillation on Eliquis, OSA on CPAP, prostate cancer, Lewy body dementia, CAD, CABG, adenocarcinoma of the sigmoid colon, AAA (s/p of repair), anemia, who presented with dysuria, burning on urination.  From SNF for rehab after admission 5/26-6/6 for left hip fracture.  Started on Macrobid at the facility 6/29 but had persistent symptoms and developed hematuria.  Admitted for management of Acute Cystitis with Hematuria having failed outpatient oral antibiotic treatment.   Subjective 03/04/21    Patient seen with grandson at bedside today.  He reports feeling okay.  No fever/chills.  Does not think he's having dysuria any more.  Denies abdominal pain, N/V.  No acute events reported.   Disposition Plan & Communication   Status is: Inpatient  Remains inpatient appropriate because: UTI with hematuria failed outpatient therapy, warrants IV antibiotics until culture results, monitoring of Hbg with bleeding.  Dispo: The patient is from: SNF              Anticipated d/c is  to: SNF              Patient currently is not medically stable to d/c.   Difficult to place patient No      Consults, Procedures, Significant Events   Consultants:  None  Procedures:  None  Antimicrobials:  Anti-infectives (From admission, onward)    Start     Dose/Rate Route Frequency Ordered Stop   03/02/21 1500  cefTRIAXone (ROCEPHIN) 1 g in sodium chloride 0.9 % 100 mL IVPB        1 g 200 mL/hr over 30  Minutes Intravenous Every 24 hours 03/01/21 1612     03/01/21 1500  cefTRIAXone (ROCEPHIN) 1 g in sodium chloride 0.9 % 100 mL IVPB        1 g 200 mL/hr over 30 Minutes Intravenous  Once 03/01/21 1456 03/01/21 1520         Micro    Objective   Vitals:   03/03/21 1509 03/03/21 2001 03/04/21 0720 03/04/21 1130  BP: 110/67 130/71 116/79 122/70  Pulse: 89 95 89 76  Resp: 20 18 16 17   Temp: 98.9 F (37.2 C) 98.2 F (36.8 C) 98 F (36.7 C) 97.6 F (36.4 C)  TempSrc:   Oral Oral  SpO2: 100% 100% 100% 100%  Weight:      Height:       No intake or output data in the 24 hours ending 03/04/21 1401  Filed Weights   03/01/21 1155  Weight: 66.2 kg    Physical Exam:  General exam: awake but drowsy, engages easily, alert, no acute distress Respiratory system: CTAB, normal respiratory effort, on room air. Cardiovascular system: normal S1/S2, RRR, no pedal edema.   Genitourinary: still with dark hematuria in cath bag Central nervous system: A&O. no gross focal neurologic deficits, normal speech Extremities: left forearm cast has been removed, now in soft immobilizer left writs, no LE edema Psychiatry: normal mood, congruent affect, normal judgment and insight  Labs   Data Reviewed: I have personally reviewed following labs and imaging studies  CBC: Recent Labs  Lab 03/01/21 1211 03/02/21 0701 03/03/21 0441 03/04/21 0452  WBC 8.3 8.2 7.5  --   NEUTROABS 5.3  --   --   --   HGB 8.9* 9.3* 8.1* 8.5*  HCT 27.9* 29.0* 24.9* 25.4*  MCV 98.2 94.2 95.4  --   PLT 181 193 179  --    Basic Metabolic Panel: Recent Labs  Lab 03/01/21 1211  NA 136  K 4.1  CL 104  CO2 29  GLUCOSE 106*  BUN 16  CREATININE 0.81  CALCIUM 8.6*   GFR: Estimated Creatinine Clearance: 62.4 mL/min (by C-G formula based on SCr of 0.81 mg/dL). Liver Function Tests: No results for input(s): AST, ALT, ALKPHOS, BILITOT, PROT, ALBUMIN in the last 168 hours. No results for input(s): LIPASE, AMYLASE in  the last 168 hours. No results for input(s): AMMONIA in the last 168 hours. Coagulation Profile: No results for input(s): INR, PROTIME in the last 168 hours. Cardiac Enzymes: No results for input(s): CKTOTAL, CKMB, CKMBINDEX, TROPONINI in the last 168 hours. BNP (last 3 results) No results for input(s): PROBNP in the last 8760 hours. HbA1C: No results for input(s): HGBA1C in the last 72 hours. CBG: No results for input(s): GLUCAP in the last 168 hours. Lipid Profile: No results for input(s): CHOL, HDL, LDLCALC, TRIG, CHOLHDL, LDLDIRECT in the last 72 hours. Thyroid Function Tests: No results for input(s): TSH, T4TOTAL, FREET4, T3FREE, THYROIDAB in  the last 72 hours. Anemia Panel: No results for input(s): VITAMINB12, FOLATE, FERRITIN, TIBC, IRON, RETICCTPCT in the last 72 hours. Sepsis Labs: No results for input(s): PROCALCITON, LATICACIDVEN in the last 168 hours.  Recent Results (from the past 240 hour(s))  Resp Panel by RT-PCR (Flu A&B, Covid) Nasopharyngeal Swab     Status: None   Collection Time: 03/01/21 11:41 AM   Specimen: Nasopharyngeal Swab; Nasopharyngeal(NP) swabs in vial transport medium  Result Value Ref Range Status   SARS Coronavirus 2 by RT PCR NEGATIVE NEGATIVE Final    Comment: (NOTE) SARS-CoV-2 target nucleic acids are NOT DETECTED.  The SARS-CoV-2 RNA is generally detectable in upper respiratory specimens during the acute phase of infection. The lowest concentration of SARS-CoV-2 viral copies this assay can detect is 138 copies/mL. A negative result does not preclude SARS-Cov-2 infection and should not be used as the sole basis for treatment or other patient management decisions. A negative result may occur with  improper specimen collection/handling, submission of specimen other than nasopharyngeal swab, presence of viral mutation(s) within the areas targeted by this assay, and inadequate number of viral copies(<138 copies/mL). A negative result must be  combined with clinical observations, patient history, and epidemiological information. The expected result is Negative.  Fact Sheet for Patients:  EntrepreneurPulse.com.au  Fact Sheet for Healthcare Providers:  IncredibleEmployment.be  This test is no t yet approved or cleared by the [place] FDA and  has been authorized for detection and/or diagnosis of SARS-CoV-2 by FDA under an Emergency Use Authorization (EUA). This EUA will remain  in effect (meaning this test can be used) for the duration of the COVID-19 declaration under Section 564(b)(1) of the Act, 21 U.S.C.section 360bbb-3(b)(1), unless the authorization is terminated  or revoked sooner.       Influenza A by PCR NEGATIVE NEGATIVE Final   Influenza B by PCR NEGATIVE NEGATIVE Final    Comment: (NOTE) The Xpert Xpress SARS-CoV-2/FLU/RSV plus assay is intended as an aid in the diagnosis of influenza from Nasopharyngeal swab specimens and should not be used as a sole basis for treatment. Nasal washings and aspirates are unacceptable for Xpert Xpress SARS-CoV-2/FLU/RSV testing.  Fact Sheet for Patients: EntrepreneurPulse.com.au  Fact Sheet for Healthcare Providers: IncredibleEmployment.be  This test is not yet approved or cleared by the [place] FDA and has been authorized for detection and/or diagnosis of SARS-CoV-2 by FDA under an Emergency Use Authorization (EUA). This EUA will remain in effect (meaning this test can be used) for the duration of the COVID-19 declaration under Section 564(b)(1) of the Act, 21 U.S.C. section 360bbb-3(b)(1), unless the authorization is terminated or revoked.  Performed at [facility], [address] [place], [place] [address]   Urine culture     Status: Abnormal   Collection Time: 03/01/21  2:00 PM   Specimen: Urine, Random  Result Value Ref Range Status   Specimen Description   Final     URINE, RANDOM Performed at [facility], [address] [place], [place] [address]    Special Requests   Final    NONE Performed at [facility], [address] [place], [place] [address]    Culture >=100,000 COLONIES/mL ESCHERICHIA COLI (A)  Final   Report Status 03/04/2021 FINAL  Final   Organism ID, Bacteria ESCHERICHIA COLI (A)  Final      Susceptibility   Escherichia coli - MIC*    AMPICILLIN >=32 RESISTANT Resistant     CEFAZOLIN <=4 SENSITIVE Sensitive  CEFEPIME <=0.12 SENSITIVE Sensitive     CEFTRIAXONE <=0.25 SENSITIVE Sensitive     CIPROFLOXACIN <=0.25 SENSITIVE Sensitive     GENTAMICIN <=1 SENSITIVE Sensitive     IMIPENEM <=0.25 SENSITIVE Sensitive     NITROFURANTOIN <=16 SENSITIVE Sensitive     TRIMETH/SULFA <=20 SENSITIVE Sensitive     AMPICILLIN/SULBACTAM 16 INTERMEDIATE Intermediate     PIP/TAZO <=4 SENSITIVE Sensitive     * >=100,000 COLONIES/mL ESCHERICHIA COLI      Imaging Studies   DG Wrist 2 Views Left  Result Date: 03/03/2021 CLINICAL DATA:  ORIF LEFT wrist fracture. EXAM: LEFT WRIST - 2 VIEW COMPARISON:  01/29/2021 FINDINGS: Fiberglass cast obscure some detail. Internal plate and screw fixation of the distal radius is noted traversing a distal diaphyseal fracture, in near-anatomic alignment and position. A remote distal ulnar fracture is again noted. No gross complicating features are noted. IMPRESSION: ORIF of the distal radial fracture, in near-anatomic alignment and position. Electronically Signed   By: Margarette Canada M.D.   On: 03/03/2021 14:04     Medications   Scheduled Meds:  vitamin C  500 mg Oral BID   bacitracin  1 application Topical QPM   benzocaine  1 application Mouth/Throat TID PC & HS   cholecalciferol  2,000 Units Oral Daily   dextromethorphan-guaiFENesin  1 tablet Oral BID   donepezil  10 mg Oral QHS   ezetimibe  10 mg Oral Daily   feeding supplement  237 mL Oral TID BM   finasteride  5 mg Oral Daily    lisinopril  2.5 mg Oral BID   magic mouthwash  15 mL Oral QID   magnesium oxide  400 mg Oral QPM   metoprolol tartrate  25 mg Oral BID   mirabegron ER  50 mg Oral Daily   mirtazapine  7.5 mg Oral QHS   mometasone-formoterol  2 puff Inhalation BID   multivitamin with minerals  1 tablet Oral Daily   polyvinyl alcohol  1 drop Both Eyes QHS   rosuvastatin  20 mg Oral Daily   traZODone  50 mg Oral QHS   Continuous Infusions:  cefTRIAXone (ROCEPHIN)  IV 1 g (03/03/21 1614)       LOS: 2 days    Time spent: 25 minutes     Ezekiel Slocumb, DO [facility]  03/04/2021, 2:01 PM      If 7PM-7AM, please contact night-coverage. How to contact the [facility] Attending or Consulting provider [place] or covering provider during after hours [place], for this patient?    Check the care team in [facility] and look for a) attending/consulting TRH provider listed and b) the [facility] team listed Log into [email or web address] and use [place]'s universal password to access. If you do not have the password, please contact the hospital operator. Locate the [facility] provider you are looking for under [facility] and page to a number that you can be directly reached. If you still have difficulty reaching the provider, please page the [facility] (Director on Call) for the Hospitalists listed on amion for assistance.

## 2021-03-04 NOTE — TOC Initial Note (Addendum)
Transition of Care [facility]) - Initial/Assessment Note    Patient Details  Name: [name] MRN: [id number] Date of Birth: [birth year]  Transition of Care [facility]) CM/SW Contact:    Eileen Stanford, LCSW Phone Number: 03/04/2021, 11:56 AM  Clinical Narrative:  CSW spoke with pt's son and he states pt is from [place] and will return there at d/c. Pt's son states he believes pt has had [place] through [facility] before and would agree to using them again. CSW has spoken to [place] with [facility] and they will service pt.    CSW received a call from son stating he spoke with [facility] and they prefer pt use [facility], pt's son would now like to use [facility]. Referral provided with [place] with [facility] she will service.              Pt gets her medication and primary care at [facility]. HH will be arranged for ALF.  Expected Discharge Plan: [place] Barriers to Discharge: Continued Medical Work up   Patient Goals and CMS Choice Patient states their goals for this hospitalization and ongoing recovery are:: for pt to return to [facility]   Choice offered to / list presented to : Adult Children  Expected Discharge Plan and Services Expected Discharge Plan: [place] In-house Referral: Clinical Social Work   Post Acute Care Choice: [place] arrangements for the past 2 months: Assisted Living Facility                           HH Arranged: OT, PT [facility] Agency: [place] ([facility]) Date HH Agency Contacted: 03/04/21 Time [place]: 9 Representative spoke with at [place]: [place] Arrangements/Services Living arrangements for the past 2 months: [place] Lives with:: Facility Resident Patient language and need for interpreter reviewed:: Yes Do you feel safe going back to the place where you live?: Yes      Need for Family Participation in Patient Care: Yes (Comment) Care giver support system in  place?: Yes (comment)   Criminal Activity/Legal Involvement Pertinent to Current Situation/Hospitalization: No - Comment as needed  Activities of Daily Living      Permission Sought/Granted Permission sought to share information with : Family Supports    Share Information with NAME: steven  Permission granted to share info w AGENCY: [facility], [facility]  Permission granted to share info w Relationship: son     Emotional Assessment Appearance:: Appears stated age Attitude/Demeanor/Rapport: Unable to Assess Affect (typically observed): Unable to Assess Orientation: : Oriented to Self, Oriented to Situation Alcohol / Substance Use: Not Applicable Psych Involvement: No (comment)  Admission diagnosis:  Lower urinary tract infectious disease [N39.0] UTI (urinary tract infection) [N39.0] Gross hematuria [R31.0] Acute cystitis with hematuria [N30.01] Patient Active Problem List   Diagnosis Date Noted   Acute cystitis with hematuria 03/02/2021   UTI (urinary tract infection) 03/01/2021   CAD (coronary artery disease) 03/01/2021   Depression with anxiety 03/01/2021   Protein-calorie malnutrition, severe 01/25/2021   Accidental fall 01/24/2021   Closed left hip fracture, initial encounter ([place]) 01/24/2021   Closed fracture of left distal radius 01/24/2021   Dementia without behavioral disturbance ([place]) 01/24/2021   Pubic ramus fracture, left, closed, initial encounter ([place]) 01/24/2021   Hip fracture ([place]) 01/24/2021   AAA (abdominal aortic aneurysm) without rupture ([place]) 06/29/2020   LBD (Lewy body dementia) ([place])  05/14/2020   Numbness and tingling in left hand 03/02/2020   Hematuria    Acute kidney injury superimposed on CKD ([place])    Sepsis due to gram-negative UTI ([place]) 12/26/2019   Left nephrolithiasis 12/26/2019   Hydronephrosis, left 12/26/2019   Status post abdominal aortic aneurysm (AAA) repair 04/15/2019   Do not intubate, cardiopulmonary resuscitation (CPR)-only code  status 10/08/2018   Prostate cancer ([place]) 02/25/2018   Prostatic cyst 01/15/2018   Blood in stool    Abnormal feces    Stricture and stenosis of esophagus    Mild cognitive impairment 08/18/2017   Senile purpura ([place]) 04/29/2017   Normocytic anemia 04/29/2017   Frequent falls 04/29/2017   Simple chronic bronchitis ([place]) 12/17/2016   Benign localized hyperplasia of prostate with urinary obstruction 07/15/2016   Elevated PSA 07/15/2016   Incomplete emptying of bladder 07/15/2016   Urge incontinence 07/15/2016   Hypothyroidism 04/25/2016   Macular degeneration 04/25/2016   Erectile dysfunction due to arterial insufficiency    Ischemic heart disease with chronotropic incompetence    CN (constipation) 02/10/2015   DD (diverticular disease) 02/10/2015   Weakness 53/61/4431   Lichen planus 54/00/8676   Restless leg 02/10/2015   Circadian rhythm disorder 02/10/2015   B12 deficiency 02/10/2015   COPD (chronic obstructive pulmonary disease) ([place]) 11/14/2014   Post Inflammatory Lung Changes 11/14/2014   PAF (paroxysmal atrial fibrillation) (HCC) CHA2DS2-VASc = 4. AC = Eliquis    Insomnia 12/09/2013   OSA on CPAP    Dyspnea on exertion - -essentially resolved with BB dose reduction & wgt loss 03/29/2013   Overweight (BMI 25.0-29.9) -- Wgt back up 01/17/2013   Hx of CABG    Essential hypertension    Hyperlipidemia with target LDL less than 70    Aorto-iliac disease ([place])    BCC (basal cell carcinoma), eyelid 01/03/2013   Allergic rhinitis 08/17/2009   Adaptation reaction 05/28/2009   Acid reflux 05/28/2009   Arthritis, degenerative 05/28/2009   Abnormality of aortic arch branch 06/01/2006   Carotid artery occlusion without infarction 06/01/2006   PAD (peripheral artery disease) - bilateral common iliac stents 12/15/2005   Atherosclerotic heart disease of artery bypass graft 09/01/1997   PCP:  Monico Blitz, NP Pharmacy:   [id number] [address]ina [address], [place] - [address] [place] [address] Phone: [phone] Fax: [phone]  [place], [place] - [address] [place] [address] Phone: [phone] Fax: [phone]  [facility], [place] [address] to [address] [employer] [address] [place] [address] Phone: [phone] Fax: [phone]  [id number] [place], [address] [place] [address] Phone: [phone] Fax: [phone]     Social Determinants of Health ([place]) Interventions    Readmission Risk Interventions Readmission Risk Prevention Plan 12/28/2019 05/19/2019  Transportation Screening Complete Complete  HRI or [place] - Complete  Social Work Consult for [place] Planning/Counseling - Complete  Palliative Care Screening - Not Applicable  Medication Review Press photographer) Complete Complete  HRI or Home Care Consult Complete -  [place] Complete -  Some recent data might be hidden

## 2021-03-05 LAB — HEMOGLOBIN AND HEMATOCRIT, BLOOD
HCT: 24.9 % — ABNORMAL LOW (ref 39.0–52.0)
Hemoglobin: 8.1 g/dL — ABNORMAL LOW (ref 13.0–17.0)

## 2021-03-05 MED ORDER — SODIUM CHLORIDE 0.9 % IV BOLUS: 1000.0000 mL | Freq: Once | INTRAVENOUS | Status: DC

## 2021-03-05 MED ORDER — SODIUM CHLORIDE 0.9 % IV BOLUS
250.0000 mL | Freq: Once | INTRAVENOUS | Status: AC
  Administered 2021-03-05: 250 mL via INTRAVENOUS

## 2021-03-05 MED ORDER — ENSURE ENLIVE PO LIQD: 237.0000 mL | Freq: Three times a day (TID) | ORAL | 12 refills | Status: AC

## 2021-03-05 MED ORDER — SODIUM CHLORIDE 0.9 % IV BOLUS
1000.0000 mL | Freq: Once | INTRAVENOUS | Status: AC
  Administered 2021-03-05: 1000 mL via INTRAVENOUS

## 2021-03-05 MED ORDER — CEPHALEXIN 500 MG PO CAPS: 500.0000 mg | ORAL_CAPSULE | Freq: Two times a day (BID) | ORAL | 0 refills | Status: AC

## 2021-03-05 NOTE — Care Management Important Message (Signed)
Important Message  Patient Details  Name: [name] MRN: [id number] Date of Birth: 04/07/1934   Medicare Important Message Given:  Yes     Dannette Barbara 03/05/2021, 11:35 AM

## 2021-03-05 NOTE — TOC Progression Note (Signed)
Transition of Care [facility]) - Progression Note    Patient Details  Name: [name] MRN: [id number] Date of Birth: 07/15/1934  Transition of Care [facility]) CM/SW Contact  [name], RN Phone Number: 03/05/2021, 1:26 PM  Clinical Narrative:    Spoke with the patient's daughter [name] she stated that the [facility] will be [facility] up the patient in a [place]i, his clothing and shoes are in the cupboard in the room, I called the home place to confirm and spoke with Ander Purpura,    Expected Discharge Plan: [place] Barriers to Discharge: Continued Medical Work up  Expected Discharge Plan and Services Expected Discharge Plan: [place] In-house Referral: Clinical Social Work   Post Acute Care Choice: [place] arrangements for the past 2 months: Assisted Living Facility                           HH Arranged: OT, PT [facility] Agency: [place] ([facility]) Date HH Agency Contacted: 03/04/21 Time [place]: 1155 Representative spoke with at [place]: [place] ([place]) Interventions    Readmission Risk Interventions Readmission Risk Prevention Plan 03/04/2021 12/28/2019 05/19/2019  Transportation Screening Complete Complete Complete  HRI or Home Care Consult - - Complete  Social Work Consult for [place] Planning/Counseling - - Complete  Palliative Care Screening - - Not Applicable  Medication Review Press photographer) Complete Complete Complete  PCP or Specialist appointment within 3-5 days of discharge Complete - -  HRI or Home Care Consult Complete Complete -  SW Recovery Care/Counseling Consult Complete - -  Palliative Care Screening Not Applicable - -  Skilled Nursing Facility Not Applicable Complete -  Some recent data might be hidden

## 2021-03-05 NOTE — Plan of Care (Signed)

## 2021-03-05 NOTE — Progress Notes (Addendum)
Urine output 150 ml and tea colored. Provider ordered bolus 29ml, 1L at 161ml/hr followed by 1L at 100 ml/hr. Will continue to monitor pt's urine output   03/05/21 0437  Intake (mL)  P.O. 0 mL  Output (mL)  Urine 150 mL  Urine Characteristics  Urinary Incontinence Yes  Urine Color Tea colored  Urine Appearance Clear

## 2021-03-05 NOTE — Discharge Summary (Addendum)
Physician Discharge Summary  YORDAN MARTINDALE LFY:[id number] DOB: Mar 21, 1934 DOA: 03/01/2021  PCP: Monico Blitz, NP  Admit date: 03/01/2021 Discharge date: 03/05/2021  Admitted From: SNF / rehab Disposition:  ALF, memory care ([facility])  Recommendations for Outpatient Follow-up:  Follow up with PCP in 1-2 weeks Please obtain BMP/CBC in one week Please monitor hematuria for resolution and consider urology outpatient referral if persistent Eliquis on hold with hematuria, follow up on resuming Eliquis when hematuria resolves Follow up patient's Blood Pressure - Lisinopril and metoprolol held at time of discharge because of mildly low BP's.  Recommend monitoring BP closely for the next week, medications may need to be resumed.  Home Health: PT, OT  Equipment/Devices: None   Discharge Condition: Stable  CODE STATUS: DNR    Diet recommendation: Dysphagia 2 diet w/ extra Gravies on foods to moisten, w/ Thin liquids VIA CUP ONLY - NO Straws.  General aspiration precautions.  Sitting Fully Upright for All po intake.  Assistance w/ feeding at meals.  Pills WHOLE in Puree.    Discharge Diagnoses: Principal Problem:   UTI (urinary tract infection) Active Problems:   Essential hypertension   Hyperlipidemia with target LDL less than 70   OSA on CPAP   PAF (paroxysmal atrial fibrillation) (HCC) CHA2DS2-VASc = 4. AC = Eliquis   COPD (chronic obstructive pulmonary disease) (HCC)   Normocytic anemia   Prostate cancer (HCC)   Hematuria   Dementia without behavioral disturbance (HCC)   CAD (coronary artery disease)   Depression with anxiety   Acute cystitis with hematuria    Summary of HPI and Hospital Course:  87 y.o. male with medical history significant of hypertension, hyperlipidemia, COPD, GERD, hypothyroidism, depression, anxiety, atrial fibrillation on Eliquis, OSA on CPAP, prostate cancer, Lewy body dementia, CAD, CABG, adenocarcinoma of the sigmoid colon, AAA (s/p of repair),  anemia, who presented with dysuria, burning on urination.  From SNF for rehab after admission 5/26-6/6 for left hip fracture.  Started on Macrobid at the facility 6/29 but had persistent symptoms and developed hematuria.  Admitted for management of Acute Cystitis with Hematuria having failed outpatient oral antibiotic treatment.     Acute cystitis with hematuria -failed outpatient Macrobid.  Treated with empiric IV Rocephin.  Urology input: bleeding likely is due to hemorrhagic cystitis, treat infection.   Hold Eliquis until hematuria resolves. Urine culture grew E. Coli resistant only to ampicillin.   Rocephin transitioned to Keflex. Discharged with 3 more days Keflex to complete course. Monitor hematuria and consider outpatient urology evaluation if persistent.  Anticipate this will improve as infection clears.     Intermittent Soft BP's - improved. Suspect mild dehydration. Hx of Essential hypertension - continued on home lisinopril, metoprolol, but had to hold for SBP's in 90's.  Monitor BP closely.   Would continue holding off lisinopril and metoprolol for now. Consider resuming with hold parameters if BP's are consistently elevated above 130/80.  Severe Malnutrition (in the context of chronic illness) related to poor appetite as evidenced by severe muscle depletion, severe fat depletion, percent weight loss (16.6% x 6 months). --Appreciate dietitian recommendations    Hypokalemia, LDL goal less than 70 -continue Crestor, Zetia  OSA -on CPAP  Paroxysmal A. Fib - CHA2DS2-VASc =4.  Rate controlled.   Eliquis on hold due to hematuria.   HOLD  metoprolol with soft blood pressures.  COPD -stable.  Bronchodilators  Chronic normocytic anemia -hemoglobin stable.  Monitor CBCs.  History of prostate cancer -continue Proscar and Myrbetriq  Dementia without behavioral disturbance -continue Aricept.  Delirium precautions.  Depression with anxiety -continue home Ativan as needed,  Remeron, trazodone at bedtime   Dry eyes -Liquifilm Tears at bedtime   Allergic rhinitis -continue Flonase   Discharge Instructions   Discharge Instructions     Call MD for:   Complete by: As directed    Call MD for:  extreme fatigue   Complete by: As directed    Call MD for:  persistant dizziness or light-headedness   Complete by: As directed    Call MD for:  persistant nausea and vomiting   Complete by: As directed    Call MD for:  severe uncontrolled pain   Complete by: As directed    Call MD for:  temperature >100.4   Complete by: As directed    Diet - low sodium heart healthy   Complete by: As directed    Discharge instructions   Complete by: As directed    Take Keflex for 3 more days to complete treatment for urinary tract infection.  Hold off Elqiuis for now until blood in the urine goes away.  This is mostly likely from the infection, and expect it will resolve.  If it continues well after completing the antibiotics, please see your primary care provider.  You may need to be evaluated by a urologist at that point.  Some bleeding with urinary tract infections is fairly common.   Increase activity slowly   Complete by: As directed       Allergies as of 03/05/2021       Reactions   Clonazepam Other (See Comments)   Altered mental status   Codeine Other (See Comments)   Altered mental status.   Ropinirole Swelling   Other reaction(s): Hallucination   Hydrocodone    confusion   Niacin And Related Other (See Comments)   Flushing of skin   Oxycodone Other (See Comments)   confusion confusion   Tramadol    Other reaction(s): Hallucination        Medication List     STOP taking these medications    Eliquis 5 MG Tabs tablet Generic drug: apixaban   lisinopril 2.5 MG tablet Commonly known as: ZESTRIL   LORazepam 0.5 MG tablet Commonly known as: ATIVAN   metoprolol tartrate 25 MG tablet Commonly known as: LOPRESSOR   nitrofurantoin  (macrocrystal-monohydrate) 100 MG capsule Commonly known as: MACROBID   PRESERVISION AREDS 2 PO       TAKE these medications    acetaminophen 500 MG tablet Commonly known as: TYLENOL Take 500-1,000 mg by mouth every 6 (six) hours as needed for mild pain or moderate pain.   bacitracin ointment Apply 1 application topically every evening. Apply to right eyelid topically for 4 weeks in the evening after instillation of Systane eye drops.   benzocaine 20 % Gel Commonly known as: HURRICAINE Use as directed 1 application in the mouth or throat 4 (four) times daily.   cephALEXin 500 MG capsule Commonly known as: KEFLEX Take 1 capsule (500 mg total) by mouth 2 (two) times daily for 3 days.   Cholecalciferol 50 MCG (2000 UT) Tbdp Take 2,000 Units by mouth daily.   donepezil 10 MG tablet Commonly known as: ARICEPT Take 10 mg by mouth at bedtime.   ezetimibe 10 MG tablet Commonly known as: ZETIA TAKE 1 TABLET BY MOUTH DAILY   feeding supplement (PRO-STAT SUGAR FREE 64) Liqd Take 30 mLs by mouth 2 (two) times daily.   feeding  supplement Liqd Take 237 mLs by mouth 3 (three) times daily between meals.   finasteride 5 MG tablet Commonly known as: PROSCAR TAKE ONE TABLET BY MOUTH EVERY DAY   fluticasone 50 MCG/ACT nasal spray Commonly known as: FLONASE PLACE TWO SPRAYS IN BOTH NOSRTILS EVERY DAY AS NEEDED FOR ALLERGIES   fluticasone-salmeterol 250-50 MCG/ACT Aepb Commonly known as: ADVAIR Inhale 1 puff into the lungs in the morning and at bedtime.   magic mouthwash Soln Take 30 mLs by mouth 4 (four) times daily.   Magnesium Oxide 500 MG Tabs Take 500 mg by mouth every evening.   mirtazapine 7.5 MG tablet Commonly known as: REMERON Take 7.5 mg by mouth at bedtime.   multivitamin with minerals Tabs tablet Take 1 tablet by mouth daily.   Myrbetriq 50 MG Tb24 tablet Generic drug: mirabegron ER TAKE ONE TABLET BY MOUTH EVERY DAY   rosuvastatin 20 MG tablet Commonly  known as: CRESTOR TAKE ONE TABLET BY MOUTH EVERY DAY   Systane 0.4-0.3 % Soln Generic drug: Polyethyl Glycol-Propyl Glycol Place 1 drop into both eyes at bedtime.   traZODone 50 MG tablet Commonly known as: DESYREL Take 50 mg by mouth at bedtime.   vitamin C 500 MG tablet Commonly known as: ASCORBIC ACID Take 500 mg by mouth 2 (two) times daily.        Allergies  Allergen Reactions   Clonazepam Other (See Comments)    Altered mental status   Codeine Other (See Comments)    Altered mental status.   Ropinirole Swelling    Other reaction(s): Hallucination   Hydrocodone     confusion   Niacin And Related Other (See Comments)    Flushing of skin   Oxycodone Other (See Comments)    confusion confusion   Tramadol     Other reaction(s): Hallucination     If you experience worsening of your admission symptoms, develop shortness of breath, life threatening emergency, suicidal or homicidal thoughts you must seek medical attention immediately by calling 911 or calling your MD immediately  if symptoms less severe.    Please note   You were cared for by a hospitalist during your hospital stay. If you have any questions about your discharge medications or the care you received while you were in the hospital after you are discharged, you can call the unit and asked to speak with the hospitalist on call if the hospitalist that took care of you is not available. Once you are discharged, your primary care physician will handle any further medical issues. Please note that NO REFILLS for any discharge medications will be authorized once you are discharged, as it is imperative that you return to your primary care physician (or establish a relationship with a primary care physician if you do not have one) for your aftercare needs so that they can reassess your need for medications and monitor your lab values.   Consultations: None    Procedures/Studies: DG Wrist 2 Views Left  Result  Date: 03/03/2021 CLINICAL DATA:  ORIF LEFT wrist fracture. EXAM: LEFT WRIST - 2 VIEW COMPARISON:  01/29/2021 FINDINGS: Fiberglass cast obscure some detail. Internal plate and screw fixation of the distal radius is noted traversing a distal diaphyseal fracture, in near-anatomic alignment and position. A remote distal ulnar fracture is again noted. No gross complicating features are noted. IMPRESSION: ORIF of the distal radial fracture, in near-anatomic alignment and position. Electronically Signed   By: Margarette Canada M.D.   On: 03/03/2021 14:04  Subjective: Pt sleeping but woke easily this AM.  Reports he feels fine.  Asks for more orange juice.  Denies having any pain, fever/chills or feeling sick.  No acute events reported. Has some mild confusion   Discharge Exam: Vitals:   03/05/21 0559 03/05/21 0755  BP: 93/60 97/72  Pulse: 89 82  Resp: 16 16  Temp: 97.8 F (36.6 C) 97.9 F (36.6 C)  SpO2: 100% 92%   Vitals:   03/04/21 1926 03/05/21 0014 03/05/21 0559 03/05/21 0755  BP: 97/68 99/68 93/60  97/72  Pulse: 87 88 89 82  Resp: 16 16 16 16   Temp: 98 F (36.7 C) 98.3 F (36.8 C) 97.8 F (36.6 C) 97.9 F (36.6 C)  TempSrc:    Oral  SpO2: 100% 97% 100% 92%  Weight:      Height:        General: Pt is resting, wakes easily, not in acute distress HEENT: edentulous, moist mucus membranes Cardiovascular: RRR, S1/S2 +, no rubs, no gallops Respiratory: CTA bilaterally, no wheezing, no rhonchi Abdominal: Soft, NT, ND, bowel sounds + Extremities: no edema, no cyanosis    The results of significant diagnostics from this hospitalization (including imaging, microbiology, ancillary and laboratory) are listed below for reference.     Microbiology: Recent Results (from the past 240 hour(s))  Resp Panel by RT-PCR (Flu A&B, Covid) Nasopharyngeal Swab     Status: None   Collection Time: 03/01/21 11:41 AM   Specimen: Nasopharyngeal Swab; Nasopharyngeal(NP) swabs in vial transport medium   Result Value Ref Range Status   SARS Coronavirus 2 by RT PCR NEGATIVE NEGATIVE Final    Comment: (NOTE) SARS-CoV-2 target nucleic acids are NOT DETECTED.  The SARS-CoV-2 RNA is generally detectable in upper respiratory specimens during the acute phase of infection. The lowest concentration of SARS-CoV-2 viral copies this assay can detect is 138 copies/mL. A negative result does not preclude SARS-Cov-2 infection and should not be used as the sole basis for treatment or other patient management decisions. A negative result may occur with  improper specimen collection/handling, submission of specimen other than nasopharyngeal swab, presence of viral mutation(s) within the areas targeted by this assay, and inadequate number of viral copies(<138 copies/mL). A negative result must be combined with clinical observations, patient history, and epidemiological information. The expected result is Negative.  Fact Sheet for Patients:  EntrepreneurPulse.com.au  Fact Sheet for Healthcare Providers:  IncredibleEmployment.be  This test is no t yet approved or cleared by the [place] FDA and  has been authorized for detection and/or diagnosis of SARS-CoV-2 by FDA under an Emergency Use Authorization (EUA). This EUA will remain  in effect (meaning this test can be used) for the duration of the COVID-19 declaration under Section 564(b)(1) of the Act, 21 U.S.C.section 360bbb-3(b)(1), unless the authorization is terminated  or revoked sooner.       Influenza A by PCR NEGATIVE NEGATIVE Final   Influenza B by PCR NEGATIVE NEGATIVE Final    Comment: (NOTE) The Xpert Xpress SARS-CoV-2/FLU/RSV plus assay is intended as an aid in the diagnosis of influenza from Nasopharyngeal swab specimens and should not be used as a sole basis for treatment. Nasal washings and aspirates are unacceptable for Xpert Xpress SARS-CoV-2/FLU/RSV testing.  Fact Sheet for  Patients: EntrepreneurPulse.com.au  Fact Sheet for Healthcare Providers: IncredibleEmployment.be  This test is not yet approved or cleared by the [place] FDA and has been authorized for detection and/or diagnosis of SARS-CoV-2 by FDA under an Emergency Use Authorization (  EUA). This EUA will remain in effect (meaning this test can be used) for the duration of the COVID-19 declaration under Section 564(b)(1) of the Act, 21 U.S.C. section 360bbb-3(b)(1), unless the authorization is terminated or revoked.  Performed at [facility], [address], [place], [place] [address]   Urine culture     Status: Abnormal   Collection Time: 03/01/21  2:00 PM   Specimen: Urine, Random  Result Value Ref Range Status   Specimen Description   Final    URINE, RANDOM Performed at [facility], [address] [place], [place] [address]    Special Requests   Final    NONE Performed at [facility], [place],  [address]    Culture >=100,000 COLONIES/mL ESCHERICHIA COLI (A)  Final   Report Status 03/04/2021 FINAL  Final   Organism ID, Bacteria ESCHERICHIA COLI (A)  Final      Susceptibility   Escherichia coli - MIC*    AMPICILLIN >=32 RESISTANT Resistant     CEFAZOLIN <=4 SENSITIVE Sensitive     CEFEPIME <=0.12 SENSITIVE Sensitive     CEFTRIAXONE <=0.25 SENSITIVE Sensitive     CIPROFLOXACIN <=0.25 SENSITIVE Sensitive     GENTAMICIN <=1 SENSITIVE Sensitive     IMIPENEM <=0.25 SENSITIVE Sensitive     NITROFURANTOIN <=16 SENSITIVE Sensitive     TRIMETH/SULFA <=20 SENSITIVE Sensitive     AMPICILLIN/SULBACTAM 16 INTERMEDIATE Intermediate     PIP/TAZO <=4 SENSITIVE Sensitive     * >=100,000 COLONIES/mL ESCHERICHIA COLI     Labs: BNP (last 3 results) No results for input(s): BNP in the last 8760 hours. Basic Metabolic Panel: Recent Labs  Lab 03/01/21 1211  NA 136  K 4.1  CL 104  CO2 29  GLUCOSE  106*  BUN 16  CREATININE 0.81  CALCIUM 8.6*   Liver Function Tests: No results for input(s): AST, ALT, ALKPHOS, BILITOT, PROT, ALBUMIN in the last 168 hours. No results for input(s): LIPASE, AMYLASE in the last 168 hours. No results for input(s): AMMONIA in the last 168 hours. CBC: Recent Labs  Lab 03/01/21 1211 03/02/21 0701 03/03/21 0441 03/04/21 0452 03/05/21 0610  WBC 8.3 8.2 7.5  --   --   NEUTROABS 5.3  --   --   --   --   HGB 8.9* 9.3* 8.1* 8.5* 8.1*  HCT 27.9* 29.0* 24.9* 25.4* 24.9*  MCV 98.2 94.2 95.4  --   --   PLT 181 193 179  --   --    Cardiac Enzymes: No results for input(s): CKTOTAL, CKMB, CKMBINDEX, TROPONINI in the last 168 hours. BNP: Invalid input(s): POCBNP CBG: No results for input(s): GLUCAP in the last 168 hours. D-Dimer No results for input(s): DDIMER in the last 72 hours. Hgb A1c No results for input(s): HGBA1C in the last 72 hours. Lipid Profile No results for input(s): CHOL, HDL, LDLCALC, TRIG, CHOLHDL, LDLDIRECT in the last 72 hours. Thyroid function studies No results for input(s): TSH, T4TOTAL, T3FREE, THYROIDAB in the last 72 hours.  Invalid input(s): FREET3 Anemia work up No results for input(s): VITAMINB12, FOLATE, FERRITIN, TIBC, IRON, RETICCTPCT in the last 72 hours. Urinalysis    Component Value Date/Time   COLORURINE RED (A) 03/01/2021 1400   APPEARANCEUR TURBID (A) 03/01/2021 1400   APPEARANCEUR Cloudy (A) 01/25/2020 1009   LABSPEC 1.025 03/01/2021 1400   LABSPEC 1.011 10/01/2012 0420   PHURINE  03/01/2021 1400    TEST NOT REPORTED DUE TO COLOR  INTERFERENCE OF URINE PIGMENT   GLUCOSEU (A) 03/01/2021 1400    TEST NOT REPORTED DUE TO COLOR INTERFERENCE OF URINE PIGMENT   GLUCOSEU Negative 10/01/2012 0420   HGBUR (A) 03/01/2021 1400    TEST NOT REPORTED DUE TO COLOR INTERFERENCE OF URINE PIGMENT   BILIRUBINUR (A) 03/01/2021 1400    TEST NOT REPORTED DUE TO COLOR INTERFERENCE OF URINE PIGMENT   BILIRUBINUR Negative  11/07/2020 1115   BILIRUBINUR Negative 01/25/2020 1009   BILIRUBINUR Negative 10/01/2012 0420   KETONESUR (A) 03/01/2021 1400    TEST NOT REPORTED DUE TO COLOR INTERFERENCE OF URINE PIGMENT   PROTEINUR (A) 03/01/2021 1400    TEST NOT REPORTED DUE TO COLOR INTERFERENCE OF URINE PIGMENT   UROBILINOGEN 0.2 11/07/2020 1115   NITRITE (A) 03/01/2021 1400    TEST NOT REPORTED DUE TO COLOR INTERFERENCE OF URINE PIGMENT   LEUKOCYTESUR (A) 03/01/2021 1400    TEST NOT REPORTED DUE TO COLOR INTERFERENCE OF URINE PIGMENT   LEUKOCYTESUR Negative 10/01/2012 0420   Sepsis Labs Invalid input(s): PROCALCITONIN,  WBC,  LACTICIDVEN Microbiology Recent Results (from the past 240 hour(s))  Resp Panel by RT-PCR (Flu A&B, Covid) Nasopharyngeal Swab     Status: None   Collection Time: 03/01/21 11:41 AM   Specimen: Nasopharyngeal Swab; Nasopharyngeal(NP) swabs in vial transport medium  Result Value Ref Range Status   SARS Coronavirus 2 by RT PCR NEGATIVE NEGATIVE Final    Comment: (NOTE) SARS-CoV-2 target nucleic acids are NOT DETECTED.  The SARS-CoV-2 RNA is generally detectable in upper respiratory specimens during the acute phase of infection. The lowest concentration of SARS-CoV-2 viral copies this assay can detect is 138 copies/mL. A negative result does not preclude SARS-Cov-2 infection and should not be used as the sole basis for treatment or other patient management decisions. A negative result may occur with  improper specimen collection/handling, submission of specimen other than nasopharyngeal swab, presence of viral mutation(s) within the areas targeted by this assay, and inadequate number of viral copies(<138 copies/mL). A negative result must be combined with clinical observations, patient history, and epidemiological information. The expected result is Negative.  Fact Sheet for Patients:  EntrepreneurPulse.com.au  Fact Sheet for Healthcare Providers:   IncredibleEmployment.be  This test is no t yet approved or cleared by the [place] FDA and  has been authorized for detection and/or diagnosis of SARS-CoV-2 by FDA under an Emergency Use Authorization (EUA). This EUA will remain  in effect (meaning this test can be used) for the duration of the COVID-19 declaration under Section 564(b)(1) of the Act, 21 U.S.C.section 360bbb-3(b)(1), unless the authorization is terminated  or revoked sooner.       Influenza A by PCR NEGATIVE NEGATIVE Final   Influenza B by PCR NEGATIVE NEGATIVE Final    Comment: (NOTE) The Xpert Xpress SARS-CoV-2/FLU/RSV plus assay is intended as an aid in the diagnosis of influenza from Nasopharyngeal swab specimens and should not be used as a sole basis for treatment. Nasal washings and aspirates are unacceptable for Xpert Xpress SARS-CoV-2/FLU/RSV testing.  Fact Sheet for Patients: EntrepreneurPulse.com.au  Fact Sheet for Healthcare Providers: IncredibleEmployment.be  This test is not yet approved or cleared by the [place] FDA and has been authorized for detection and/or diagnosis of SARS-CoV-2 by FDA under an Emergency Use Authorization (EUA). This EUA will remain in effect (meaning this test can be used) for the duration of the COVID-19 declaration under Section 564(b)(1) of the Act, 21 U.S.C. section 360bbb-3(b)(1), unless the authorization is  terminated or revoked.  Performed at [facility], [address] [place], [place] [address]   Urine culture     Status: Abnormal   Collection Time: 03/01/21  2:00 PM   Specimen: Urine, Random  Result Value Ref Range Status   Specimen Description   Final    URINE, RANDOM Performed at [facility], [address], [place], [place] [address]    Special Requests   Final    NONE Performed at [facility], [place], [place] [address]    Culture  >=100,000 COLONIES/mL ESCHERICHIA COLI (A)  Final   Report Status 03/04/2021 FINAL  Final   Organism ID, Bacteria ESCHERICHIA COLI (A)  Final      Susceptibility   Escherichia coli - MIC*    AMPICILLIN >=32 RESISTANT Resistant     CEFAZOLIN <=4 SENSITIVE Sensitive     CEFEPIME <=0.12 SENSITIVE Sensitive     CEFTRIAXONE <=0.25 SENSITIVE Sensitive     CIPROFLOXACIN <=0.25 SENSITIVE Sensitive     GENTAMICIN <=1 SENSITIVE Sensitive     IMIPENEM <=0.25 SENSITIVE Sensitive     NITROFURANTOIN <=16 SENSITIVE Sensitive     TRIMETH/SULFA <=20 SENSITIVE Sensitive     AMPICILLIN/SULBACTAM 16 INTERMEDIATE Intermediate     PIP/TAZO <=4 SENSITIVE Sensitive     * >=100,000 COLONIES/mL ESCHERICHIA COLI     Time coordinating discharge: Over 30 minutes  SIGNED:   Ezekiel Slocumb, DO [facility] 03/05/2021, 1:38 PM   If 7PM-7AM, please contact night-coverage [email or web address]

## 2021-04-08 ENCOUNTER — Encounter: Payer: Self-pay | Admitting: Emergency Medicine

## 2021-04-08 ENCOUNTER — Emergency Department
Admission: EM | Admit: 2021-04-08 | Discharge: 2021-04-08 | Disposition: A | Discharge status: Home / Self Care | Payer: Medicare Other | Attending: Emergency Medicine | Admitting: Emergency Medicine

## 2021-04-08 ENCOUNTER — Other Ambulatory Visit: Payer: Self-pay

## 2021-04-08 ENCOUNTER — Emergency Department: Payer: Medicare Other

## 2021-04-08 DIAGNOSIS — E039 Hypothyroidism, unspecified: Secondary | ICD-10-CM | POA: Diagnosis not present

## 2021-04-08 DIAGNOSIS — S51011A Laceration without foreign body of right elbow, initial encounter: Secondary | ICD-10-CM | POA: Diagnosis present

## 2021-04-08 DIAGNOSIS — W19XXXA Unspecified fall, initial encounter: Secondary | ICD-10-CM

## 2021-04-08 DIAGNOSIS — Z87891 Personal history of nicotine dependence: Secondary | ICD-10-CM | POA: Insufficient documentation

## 2021-04-08 DIAGNOSIS — M79661 Pain in right lower leg: Secondary | ICD-10-CM | POA: Insufficient documentation

## 2021-04-08 DIAGNOSIS — Z951 Presence of aortocoronary bypass graft: Secondary | ICD-10-CM | POA: Diagnosis not present

## 2021-04-08 DIAGNOSIS — G3183 Dementia with Lewy bodies: Secondary | ICD-10-CM | POA: Diagnosis not present

## 2021-04-08 DIAGNOSIS — Z8546 Personal history of malignant neoplasm of prostate: Secondary | ICD-10-CM | POA: Insufficient documentation

## 2021-04-08 DIAGNOSIS — I2510 Atherosclerotic heart disease of native coronary artery without angina pectoris: Secondary | ICD-10-CM | POA: Insufficient documentation

## 2021-04-08 DIAGNOSIS — Z85038 Personal history of other malignant neoplasm of large intestine: Secondary | ICD-10-CM | POA: Insufficient documentation

## 2021-04-08 DIAGNOSIS — J449 Chronic obstructive pulmonary disease, unspecified: Secondary | ICD-10-CM | POA: Insufficient documentation

## 2021-04-08 DIAGNOSIS — Y92129 Unspecified place in nursing home as the place of occurrence of the external cause: Secondary | ICD-10-CM | POA: Insufficient documentation

## 2021-04-08 DIAGNOSIS — F0280 Dementia in other diseases classified elsewhere without behavioral disturbance: Secondary | ICD-10-CM

## 2021-04-08 DIAGNOSIS — Z85828 Personal history of other malignant neoplasm of skin: Secondary | ICD-10-CM | POA: Diagnosis not present

## 2021-04-08 DIAGNOSIS — S61411A Laceration without foreign body of right hand, initial encounter: Secondary | ICD-10-CM

## 2021-04-08 MED ORDER — ACETAMINOPHEN 325 MG PO TABS
ORAL_TABLET | ORAL | Status: AC
  Administered 2021-04-08: 650 mg via ORAL
  Filled 2021-04-08: qty 2

## 2021-04-08 MED ORDER — BACITRACIN ZINC 500 UNIT/GM EX OINT
TOPICAL_OINTMENT | Freq: Two times a day (BID) | CUTANEOUS | Status: DC
  Administered 2021-04-08: 1 via TOPICAL
  Filled 2021-04-08: qty 0.9

## 2021-04-08 MED ORDER — ACETAMINOPHEN 325 MG PO TABS: 650.0000 mg | ORAL_TABLET | Freq: Once | ORAL | Status: AC

## 2021-04-08 NOTE — ED Notes (Signed)
FIRST NURSE: Pt to ER via EMS from [facility] with mechanical fall. Pt and another resident locked walkers and pt fell hitting elbow.  Noted skin tear bleeding controlled, did not hit head.

## 2021-04-08 NOTE — ED Provider Notes (Signed)
[facility] Emergency Department Provider Note  ____________________________________________   Event Date/Time   First MD Initiated Contact with Patient 04/08/21 2306     (approximate)  I have reviewed the triage vital signs and the nursing notes.   HISTORY  Chief Complaint Fall  Level 5 caveat:  history/ROS limited by chronic dementia  HPI [name] is a 87 y.o. male with extensive medical history as listed below who is accompanied by his son from his memory care unit facility.  He presents after a fall.   So the details are a bit unclear but reportedly the patient normally ambulates with an assistive device and was given something different than he is used to today.  As a result he fell and struck his arm on the wall.  He has a skin tear to the right elbow as well as a small into his hand.  He is complaining of pain in the elbow as well as some pain in his right lower leg and right hand.  The patient is resting and not able to contribute to the exam or the history other than to say that his right elbow hurts.  Otherwise he is not in distress.  His son feels that he is roughly at his baseline.     Past Medical History:  Diagnosis Date   AAA (abdominal aortic aneurysm) ([place]) 05/2019   Relativley Rapid progression from June-Aug 2020 (4.6 - 5.4 cm): s/p EVAR (Dr. Lucky Cowboy [facility]): EVAR - 23 mm prox, 12 cm distal & x 18 cm length Gore Excluder Endoprosthesis Main body (via RFA distal to lowest Renal A); 72m x 14 cm L Contralateral Limb for L Iliac - extended with 8 mm x 6cm LifeStar stent (post-dilated with 7 mm DEB).  Additional R Iliac PTA not required.    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 ([place] 09/15/2017   09-15-2017 DIAGNOSIS:  A. COLON POLYP X 2, ASCENDING; COLD SNARE:  - TUBULAR ADENOMA.  - NEGATIVE FOR HIGH-GRADE DYSPLASIA AND MALIGNANCY.  - NO ADDITIONAL TISSUE IDENTIFIED; FECAL MATERIAL PRESENT.   B. COLON POLYP X 2, DESCENDING; COLD SNARE:  -  ADENOCARCINOMA ARISING IN A DYSPLASTIC SESSILE SERRATED ADENOMA, WITH  LYMPHOVASCULAR INVASION, SEE COMMENT.  - TUBULAR ADENOMA, 2 FRAGMENTS, NEGATIVE FO   Arthritis    Basal cell carcinoma of eyelid 01/03/2013   2019 - R side of Nose   Benign neoplasm of ascending colon    Benign neoplasm of descending colon    Bilateral iliac artery stenosis ([place] 2007   Bilateral Iliac A stenting; extension of EVAR limbs into both Iliacs -- additional stent placed in L Iliac.   Cancer ([facility]    Skin cancer   Colon polyps    Complication of anesthesia    severe confusion agitation requiring hospital admission x 4 days   Compression fracture of fourth lumbar vertebra ([place] 04/2019   - s/p Kyphoplasty; T12, L3-5 ([facility]   COPD (chronic obstructive pulmonary disease) (HCC)    Centrilobular Emphysema   Coronary artery disease involving native coronary artery without angina pectoris 1998   - s/p CABG, occluded SVG-D1; patent LIMA-LAD, SVG-OM, SVG- RCA   Dementia ([place]    memory loss - started noting late 2019   Falls frequently    broke foot   Fracture 2021   left foot   GERD (gastroesophageal reflux disease)    Hemorrhoid 02/10/2015   History of hiatal hernia    History of kidney stones    HOH (  hard of hearing)    Hypertension    Lewy body dementia ([place]) 04/04/2020   Macular degeneration disease    Osteoporosis    PAF (paroxysmal atrial fibrillation) (HCC)    on Eliquis for OAC - Rate Control    Prostate cancer ([place])    Prostate disorder    Sciatic pain    Chronic   Sleep apnea    cpap   Temporary cerebral vascular dysfunction 02/10/2015   Had negative work-up.  July, 2012.  No medication changes, had forgot them that day, got dehydrated.    Urinary incontinence     Patient Active Problem List   Diagnosis Date Noted   Acute cystitis with hematuria 03/02/2021   UTI (urinary tract infection) 03/01/2021   CAD (coronary artery disease) 03/01/2021   Depression with anxiety 03/01/2021    Protein-calorie malnutrition, severe 01/25/2021   Accidental fall 01/24/2021   Closed left hip fracture, initial encounter ([place]) 01/24/2021   Closed fracture of left distal radius 01/24/2021   Dementia without behavioral disturbance ([place]) 01/24/2021   Pubic ramus fracture, left, closed, initial encounter ([place]) 01/24/2021   Hip fracture ([place]) 01/24/2021   AAA (abdominal aortic aneurysm) without rupture ([place]) 06/29/2020   LBD (Lewy body dementia) ([place]) 05/14/2020   Numbness and tingling in left hand 03/02/2020   Hematuria    Acute kidney injury superimposed on CKD ([place])    Sepsis due to gram-negative UTI ([place]) 12/26/2019   Left nephrolithiasis 12/26/2019   Hydronephrosis, left 12/26/2019   Status post abdominal aortic aneurysm (AAA) repair 04/15/2019   Do not intubate, cardiopulmonary resuscitation (CPR)-only code status 10/08/2018   Prostate cancer ([place]) 02/25/2018   Prostatic cyst 01/15/2018   Blood in stool    Abnormal feces    Stricture and stenosis of esophagus    Mild cognitive impairment 08/18/2017   Senile purpura ([place]) 04/29/2017   Normocytic anemia 04/29/2017   Frequent falls 04/29/2017   Simple chronic bronchitis ([place]) 12/17/2016   Benign localized hyperplasia of prostate with urinary obstruction 07/15/2016   Elevated PSA 07/15/2016   Incomplete emptying of bladder 07/15/2016   Urge incontinence 07/15/2016   Hypothyroidism 04/25/2016   Macular degeneration 04/25/2016   Erectile dysfunction due to arterial insufficiency    Ischemic heart disease with chronotropic incompetence    CN (constipation) 02/10/2015   DD (diverticular disease) 02/10/2015   Weakness 24/23/5361   Lichen planus 44/31/5400   Restless leg 02/10/2015   Circadian rhythm disorder 02/10/2015   B12 deficiency 02/10/2015   COPD (chronic obstructive pulmonary disease) ([place]) 11/14/2014   Post Inflammatory Lung Changes 11/14/2014   PAF (paroxysmal atrial fibrillation) (HCC) CHA2DS2-VASc = 4. AC = Eliquis     Insomnia 12/09/2013   OSA on CPAP    Dyspnea on exertion - -essentially resolved with BB dose reduction & wgt loss 03/29/2013   Overweight (BMI 25.0-29.9) -- Wgt back up 01/17/2013   Hx of CABG    Essential hypertension    Hyperlipidemia with target LDL less than 70    Aorto-iliac disease ([place])    BCC (basal cell carcinoma), eyelid 01/03/2013   Allergic rhinitis 08/17/2009   Adaptation reaction 05/28/2009   Acid reflux 05/28/2009   Arthritis, degenerative 05/28/2009   Abnormality of aortic arch branch 06/01/2006   Carotid artery occlusion without infarction 06/01/2006   PAD (peripheral artery disease) - bilateral common iliac stents 12/15/2005   Atherosclerotic heart disease of artery bypass graft 09/01/1997    Past Surgical History:  Procedure Laterality Date  ABDOMINAL AORTIC ENDOVASCULAR STENT GRAFT Bilateral 05/18/2019   Procedure: ABDOMINAL AORTIC ENDOVASCULAR STENT GRAFT;  Surgeon: Algernon Huxley, MD;  Location: [facility] ORS;  Service: Vascular;  Laterality: Bilateral;   ANKLE SURGERY Left    ORIF    Arch aortogram and carotid aortogram  06/02/2006   Dr Oneida Alar did surgery   BASAL CELL CARCINOMA EXCISION  04/2016   Dermatology   CARDIAC CATHETERIZATION  01/30/1998    (Dr. Linard Millers): Native LAD & mRCA CTO. 90% D1. Mod Cx. SVG-D1 CTO. SVG-RCA & SVG-OM along with LIMA-LAD patent;  LV NORMAL Fxn   CAROTID ENDARTERECTOMY Left Oct. 29, 2007   Dr. Oneida Alar:   CATARACT EXTRACTION W/PHACO Right 03/05/2017   Procedure: CATARACT EXTRACTION PHACO AND INTRAOCULAR LENS PLACEMENT ([place]);  Surgeon: Leandrew Koyanagi, MD;  Location: [facility] ORS;  Service: Ophthalmology;  Laterality: Right;  [place] 00:58.5 AP% 10.6 CDE 6.20 Fluid Pack lot # [id number]   CATARACT EXTRACTION W/PHACO Left 04/15/2017   Procedure: CATARACT EXTRACTION PHACO AND INTRAOCULAR LENS PLACEMENT ([place])  Left;  Surgeon: Leandrew Koyanagi, MD;  Location: [facility];  Service: Ophthalmology;  Laterality: Left;   COLONOSCOPY  WITH PROPOFOL N/A 09/15/2017   Procedure: COLONOSCOPY WITH PROPOFOL;  Surgeon: Lucilla Lame, MD;  Location: [facility] ENDOSCOPY;  Service: Endoscopy;  Laterality: N/A;   COLONOSCOPY WITH PROPOFOL N/A 10/26/2018   Procedure: COLONOSCOPY WITH PROPOFOL;  Surgeon: Lucilla Lame, MD;  Location: [facility] ENDOSCOPY;  Service: Endoscopy;  Laterality: N/A;   CORONARY ARTERY BYPASS GRAFT  1998   LIMA-LAD, SVG-OM, SVG-RPDA, SVG-DIAG   CPET / MET  12/2012   Mild chronotropic incompetence - read 82% of predicted; also reduced effort; peak VO2 15.7 / 75% (did not reach Max effort) -- suggested ischemic response in last 1.5 minutes of exercise. Normal pulmonary function on PFTs but poor response to   CYSTOSCOPY W/ RETROGRADES Left 01/13/2020   Procedure: CYSTOSCOPY WITH RETROGRADE PYELOGRAM;  Surgeon: Billey Co, MD;  Location: [facility] ORS;  Service: Urology;  Laterality: Left;   CYSTOSCOPY WITH STENT PLACEMENT Left 12/26/2019   Procedure: CYSTOSCOPY WITH STENT PLACEMENT;  Surgeon: Billey Co, MD;  Location: [facility] ORS;  Service: Urology;  Laterality: Left;   CYSTOSCOPY/URETEROSCOPY/HOLMIUM LASER/STENT PLACEMENT Left 01/13/2020   Procedure: CYSTOSCOPY/URETEROSCOPY/HOLMIUM LASER/STENT EXCHANGE;  Surgeon: Billey Co, MD;  Location: [facility] ORS;  Service: Urology;  Laterality: Left;   ENDOVASCULAR REPAIR/STENT GRAFT N/A 05/18/2019   Procedure: ENDOVASCULAR REPAIR/STENT GRAFT;  Surgeon: Algernon Huxley, MD;  Location: [facility] INVASIVE CV LAB;; EVAR - 23 mm prox, 12 cm distal & x 18 cm length Gore Excluder Endoprosthesis Main body (via RFA distal to lowest Renal A); 15m x 14 cm L Contralateral Limb for L Iliac - extended with 8 mm x 6cm LifeStar stent; no additional R Iliac PTA needed.    ESOPHAGOGASTRODUODENOSCOPY (EGD) WITH PROPOFOL N/A 09/15/2017   Procedure: ESOPHAGOGASTRODUODENOSCOPY (EGD) WITH PROPOFOL;  Surgeon: WLucilla Lame MD;  [facility]  Service: Endoscopy;  Laterality: N/A;   FRACTURE SURGERY Right  03/19/2015   wrist   FRACTURE SURGERY Left    HIATAL HERNIA REPAIR     ILIAC ARTERY STENT  12/15/2005   PTA and direct stenting rgt and lft common iliac arteries   INCISION AND DRAINAGE OF WOUND Left 01/29/2021   Procedure: IRRIGATION AND DEBRIDEMENT Left Wrist and Removal of Plate;  Surgeon: KThornton Park MD;  Location: [facility] ORS;  Service: Orthopedics;  Laterality: Left;   INTRAMEDULLARY (IM) NAIL INTERTROCHANTERIC Left 01/29/2021   Procedure: INTRAMEDULLARY (  IM) NAIL INTERTROCHANTRIC;  Surgeon: Thornton Park, MD;  Location: [facility] ORS;  Service: Orthopedics;  Laterality: Left;   KYPHOPLASTY N/A 04/05/2019   Procedure: KYPHOPLASTY T12, L3, L4 L5;  Surgeon: Hessie Knows, MD;  Location: [facility] ORS;  Service: Orthopedics;  Laterality: N/A;   NM GATED MYOVIEW ([place] HX)  05/'19; 8/'20   a) [facility]: Low Risk - no ischemia or infarct.  EF > 65%. no RWMA;; b) [facility]): Normal wall motion.  No ischemia or infarction.   OPEN REDUCTION INTERNAL FIXATION (ORIF) DISTAL RADIAL FRACTURE Left 01/13/2019   Procedure: OPEN REDUCTION INTERNAL FIXATION (ORIF) DISTAL  RADIAL FRACTURE - LEFT - SLEEP APNEA;  Surgeon: Hessie Knows, MD;  Location: [facility] ORS;  Service: Orthopedics;  Laterality: Left;   OPEN REDUCTION INTERNAL FIXATION (ORIF) DISTAL RADIAL FRACTURE Left 01/29/2021   Procedure: OPEN REDUCTION INTERNAL FIXATION (ORIF) DISTAL RADIAL FRACTURE;  Surgeon: Thornton Park, MD;  Location: [facility];  Service: Orthopedics;  Laterality: Left;   ORIF ELBOW FRACTURE Right 01/01/2017   Procedure: OPEN REDUCTION INTERNAL FIXATION (ORIF) ELBOW/OLECRANON FRACTURE;  Surgeon: Hessie Knows, MD;  Location: [facility];  Service: Orthopedics;  Laterality: Right;   ORIF WRIST FRACTURE Right 03/19/2015   Procedure: OPEN REDUCTION INTERNAL FIXATION (ORIF) WRIST FRACTURE;  Surgeon: Hessie Knows, MD;  Location: [facility] ORS;  Service: Orthopedics;  Laterality: Right;   SHOULDER ARTHROSCOPY Right    THORACIC AORTA - CAROTID ANGIOGRAM   October 2007   Dr. 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 Dilated left atrium.   TRANSTHORACIC ECHOCARDIOGRAM  04/2019   [facility] April 20, 2019): EF 55-60 %.  Mild LVH.  Relatively normal valves.    Prior to Admission medications   Medication Sig Start Date End Date Taking? Authorizing Provider  acetaminophen (TYLENOL) 500 MG tablet Take 500-1,000 mg by mouth every 6 (six) hours as needed for mild pain or moderate pain.    [provider]  Amino Acids-Protein Hydrolys (FEEDING SUPPLEMENT, PRO-STAT SUGAR FREE 64,) LIQD Take 30 mLs by mouth 2 (two) times daily.    [provider]  bacitracin ointment Apply 1 application topically every evening. Apply to right eyelid topically for 4 weeks in the evening after instillation of Systane eye drops.    [provider]  benzocaine (HURRICAINE) 20 % GEL Use as directed 1 application in the mouth or throat 4 (four) times daily.    [provider]  Cholecalciferol 50 MCG (2000 UT) TBDP Take 2,000 Units by mouth daily.    [provider]  donepezil (ARICEPT) 10 MG tablet Take 10 mg by mouth at bedtime. 12/26/20   [provider]  ezetimibe (ZETIA) 10 MG tablet TAKE 1 TABLET BY MOUTH DAILY 12/21/20   [name], MD  feeding supplement (ENSURE ENLIVE / ENSURE PLUS) LIQD Take 237 mLs by mouth 3 (three) times daily between meals. 03/05/21   Nicole Kindred A, DO  finasteride (PROSCAR) 5 MG tablet TAKE ONE TABLET BY MOUTH EVERY DAY 12/21/20   Stoioff, Ronda Fairly, MD  fluticasone (FLONASE) 50 MCG/ACT nasal spray PLACE TWO SPRAYS IN BOTH NOSRTILS EVERY DAY AS NEEDED FOR ALLERGIES 08/07/20   [name], MD  fluticasone-salmeterol (ADVAIR) 250-50 MCG/ACT AEPB Inhale 1 puff into the lungs in the morning and at bedtime.    [provider]  magic mouthwash SOLN Take 30 mLs by  mouth 4 (four) times daily.    [provider]  Magnesium Oxide 500 MG TABS Take 500 mg by mouth every evening.    [provider]  mirtazapine (REMERON) 7.5 MG tablet Take 7.5 mg by mouth at bedtime. 12/26/20   [provider]  Multiple Vitamin (MULTIVITAMIN WITH MINERALS) TABS tablet Take 1 tablet by mouth daily.    [provider]  MYRBETRIQ 50 MG TB24 tablet TAKE ONE TABLET BY MOUTH EVERY DAY 12/21/20   Stoioff, Ronda Fairly, MD  Polyethyl Glycol-Propyl Glycol (SYSTANE) 0.4-0.3 % SOLN Place 1 drop into both eyes at bedtime.    [provider]  rosuvastatin (CRESTOR) 20 MG tablet TAKE ONE TABLET BY MOUTH EVERY DAY 12/21/20   [name], MD  traZODone (DESYREL) 50 MG tablet Take 50 mg by mouth at bedtime. 12/24/20   [provider]  vitamin C (ASCORBIC ACID) 500 MG tablet Take 500 mg by mouth 2 (two) times daily.    [provider]    Allergies Clonazepam, Codeine, Ropinirole, Hydrocodone, Niacin and related, Oxycodone, and Tramadol  Family History  Problem Relation Age of Onset   Ulcers Mother        Peptic   Dementia Mother    Alcohol abuse Father     Social History Social History   Tobacco Use   Smoking status: Former    Types: Cigarettes    Quit date: 08/04/1997    Years since quitting: 23.6   Smokeless tobacco: Never  Vaping Use   Vaping Use: Never used  Substance Use Topics   Alcohol use: No   Drug use: No    Review of Systems Level 5 caveat:  history/ROS limited by chronic dementia   ____________________________________________   PHYSICAL EXAM:  VITAL SIGNS: ED Triage Vitals  Enc Vitals Group     BP 04/08/21 1844 106/73     Pulse Rate 04/08/21 1844 86     Resp 04/08/21 1844 18     Temp 04/08/21 1844 (!) 97.5 F (36.4 C)     Temp src --      SpO2 04/08/21 1844 99 %     Weight 04/08/21 1905 107.5 kg (237 lb)     Height 04/08/21 1905 1.829 m (6')     Head  Circumference --      Peak Flow --      Pain Score 04/08/21 1905 8     Pain Loc --      Pain Edu? --      Excl. in [place]? --     Constitutional: Sleeping, awakens to gentle touch and soft voice.  No acute distress. Eyes: Conjunctivae are normal.  Head: Atraumatic. Nose: No congestion/rhinnorhea. Mouth/Throat: Patient is wearing a mask. Neck: No stridor.  No meningeal signs.   Cardiovascular: Normal rate, regular rhythm. Good peripheral circulation. Respiratory: Normal respiratory effort.  No retractions. Musculoskeletal: Large skin tear to right elbow with a significant amount of missing tissue.  However I am able to flex and extend the patient's elbow without any difficulty and there is no large joint effusion.  He also has a small skin tear to the dorsal aspect of his right hand but it is fairly small.  No other obvious gross deformities to his extremities.  The patient is ambulatory at his baseline with assistance by his son and his son has no concerns about his current level of weightbearing or ambulation. Neurologic:  No gross focal neurologic deficits are appreciated.  Skin:  Skin is warm, dry and intact.   ____________________________________________    RADIOLOGY I, Hinda Kehr, personally viewed and evaluated these images (plain radiographs) as part of my medical decision making, as well as reviewing the written report by the radiologist.  ED MD interpretation: No fractures or dislocations noted on the patient's elbow, tibia/fibula, and hand x-rays.  There was a question by the radiologist about whether or not an age-indeterminate right inferior pubic ramus fracture exists on the hip/pelvis x-ray.  Official radiology report(s): DG Elbow Complete Right  Result Date: 04/08/2021 CLINICAL DATA:  Fall, elbow pain EXAM: RIGHT ELBOW - COMPLETE 3+ VIEW COMPARISON:  None. FINDINGS: Plate and screw fixation device in the proximal ulna related to remote injury. No acute fracture,  subluxation or dislocation. No joint effusion. IMPRESSION: No acute bony abnormality. Electronically Signed   By: Rolm Baptise M.D.   On: 04/08/2021 20:24   DG Tibia/Fibula Right  Result Date: 04/08/2021 CLINICAL DATA:  Fall EXAM: RIGHT TIBIA AND FIBULA - 2 VIEW COMPARISON:  None. FINDINGS: Chondrocalcinosis within the right knee. Mild joint space narrowing. No acute bony abnormality. Specifically, no fracture, subluxation, or dislocation. Vascular calcifications noted. IMPRESSION: No acute bony abnormality. Electronically Signed   By: Rolm Baptise M.D.   On: 04/08/2021 20:25   DG Hand Complete Right  Result Date: 04/08/2021 CLINICAL DATA:  Fall, right hand pain EXAM: RIGHT HAND - COMPLETE 3+ VIEW COMPARISON:  None. FINDINGS: Advanced degenerative changes at the 1st carpometacarpal joint. Plate and screw fixation device in the distal right radius. No acute fracture, subluxation or dislocation. IMPRESSION: No acute bony abnormality. Electronically Signed   By: Rolm Baptise M.D.   On: 04/08/2021 20:24   DG Hip Unilat  With Pelvis 2-3 Views Right  Result Date: 04/08/2021 CLINICAL DATA:  Fall EXAM: DG HIP (WITH OR WITHOUT PELVIS) 2-3V RIGHT COMPARISON:  None. FINDINGS: Bilateral iliac stents noted. Diffuse vascular calcifications. Hardware in the proximal left femur related to remote trauma. Lucency noted in the right inferior pubic ramus. This could reflect nondisplaced fracture, age indeterminate. No subluxation or dislocation. IMPRESSION: Nondisplaced age indeterminate right inferior pubic ramus fracture. Electronically Signed   By: Rolm Baptise M.D.   On: 04/08/2021 20:23    ____________________________________________   PROCEDURES   Procedure(s) performed (including Critical Care):  Procedures   ____________________________________________   INITIAL IMPRESSION / MDM / [place] / ED COURSE  As part of my medical decision making, I reviewed the following data within the  [place] History obtained from family, Nursing notes reviewed and incorporated, Old chart reviewed, Radiograph reviewed , and Notes from prior ED visits   Differential diagnosis includes, but is not limited to, fracture/dislocation, intracranial bleed, cervical spine injury, acute infection or electrolyte abnormality.    History given by the patient's son strongly suggest that he had a mechanical fall as result of poor ambulation at baseline and being given a different assistive device for walking around.  He has had multiple prior orthopedic issues.  His son states that his main concern just to make sure there are no new fractures tonight.  The patient's main area of concern is his right elbow, and fortunately, I personally reviewed the patient's imaging and agree with the radiologist's interpretation that there are no new fractures nor dislocations.  There was a question about a possible pubic ramus injury on the right side, but the patient does not have any pain that correlates clinically with this radiographic finding.  The  son reports that he has had multiple prior injuries and it may be sequela from an old injury.  The patient has no evidence of head trauma, no cervical spine tenderness, and I do not feel he would benefit from advanced imaging at this time.  The son is very comfortable with discharge back to his facility and he will take his father back as soon as he is discharged.  The wound on his right elbow was cleaned and dressed with bacitracin and gauze.  I provided information about the [facility] wound care center to help with follow-up and strongly encourage close follow-up with PCP.  The patient's son understands and agrees with the plan.  No evidence of an emergent medical condition at this time.           ____________________________________________  FINAL CLINICAL IMPRESSION(S) / ED DIAGNOSES  Final diagnoses:  Fall, initial encounter  Skin tear of right elbow  without complication, initial encounter  Skin tear of right hand without complication, initial encounter  Lewy body dementia without behavioral disturbance ([place])     MEDICATIONS GIVEN DURING THIS VISIT:  Medications  bacitracin ointment (has no administration in time range)     ED Discharge Orders          Ordered    AMB referral to wound care center       Comments: large skin avulsion injury to right elbow   04/08/21 2326             Note:  This document was prepared using Dragon voice recognition software and may include unintentional dictation errors.   Hinda Kehr, MD 04/08/21 [phone]

## 2021-04-08 NOTE — Discharge Instructions (Addendum)
Fortunately it does not appear that Mr. Kope has any new broken bones or severe injuries.  He has a substantial skin tear to his right elbow with significant amount of missing skin.  We have dressed the wound with bacitracin ointment and gauze.  His dressing should be changed twice daily, either using nonocclusive gauze (such as Xeroform or similar material) or by coating with a layer of bacitracin ointment and covering with regular gauze.  He also has a much smaller skin tear on his right hand that should also be similarly dressed.  Please have him see his primary care provider at the next available opportunity for follow-up.  He may benefit from wound care follow-up; if wound care per is not available at his facility, I have provided follow-up information with the [facility] wound care center. You may call this number to schedule a follow up appointment, and we put in an order for follow-up at the wound center, so they may contact you to follow-up.  Please follow-up at the next available opportunity with primary care and return to the emergency department if Mr. Veillette develops any new or worsening symptoms that concern you.

## 2021-04-08 NOTE — ED Triage Notes (Signed)
Pts reports that he was at home place and fell and hit his arm on the wall. He has a laceration on his right elbow. He is complaining of pain in the right elbow, right lower leg and right hand pain.

## 2021-04-08 NOTE — ED Notes (Signed)
MD at the bedside  

## 2021-04-08 NOTE — ED Notes (Signed)
Skin tear on right elbow cleansed with normal saline, bacitracin applied, non-adherent bandage placed, and secure with kerlix. New band-aid applied to left knuckle area.

## 2021-04-25 IMAGING — CT CT HEAD WITHOUT CONTRAST
5 of 7 series · 17 of 47 positions shown, 18 images · non-contrast
Comparison: Radiographs June 09, 2016. CT scan December 12, 2015.

CLINICAL DATA: Trauma.

EXAM:
CT HEAD WITHOUT CONTRAST
CT CERVICAL SPINE WITHOUT CONTRAST
TECHNIQUE: Multidetector CT imaging of the head and cervical spine was
performed following the standard protocol without intravenous
contrast. Multiplanar CT image reconstructions of the cervical spine
were also generated.

[Series 2: head wo · axial · 0.39mm/px · z∈[-86,-31]mm · 2 of 33 slices shown, 3 images]
[im 11/33  brain]
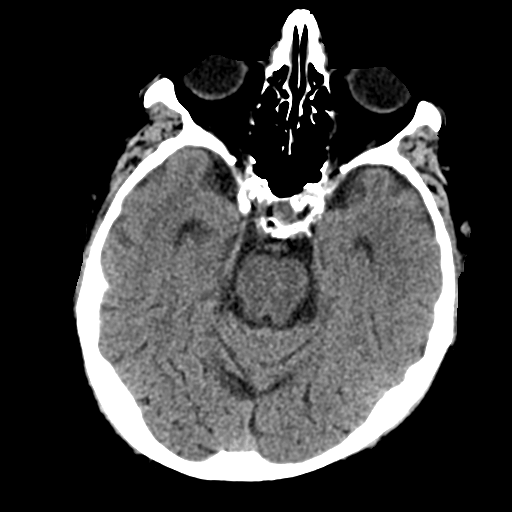
[im 11/33  bone]
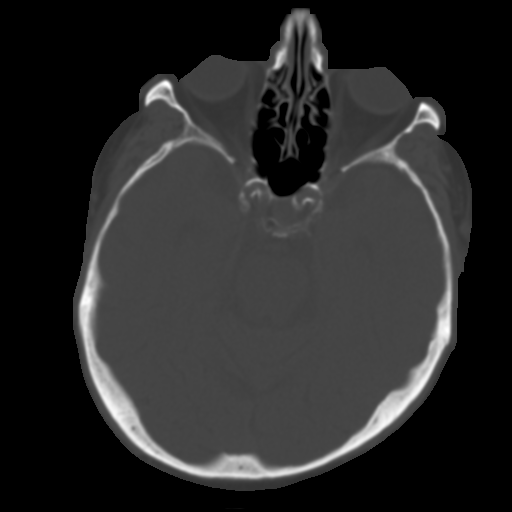
[im 22/33  brain]
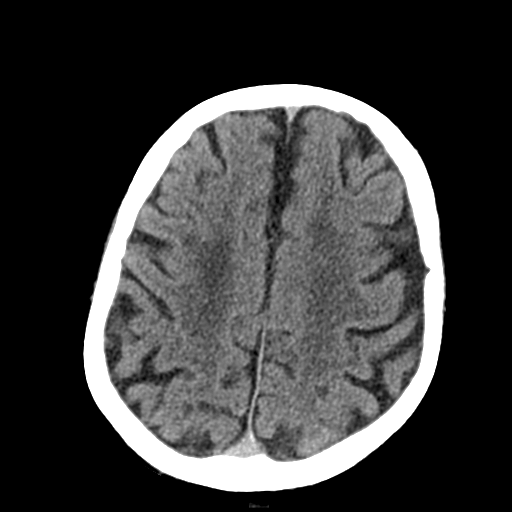

[Series 6: sagittal soft tissue · sagittal · 0.33mm/px · 1 of 63 slices shown]
[im 32/63  brain]
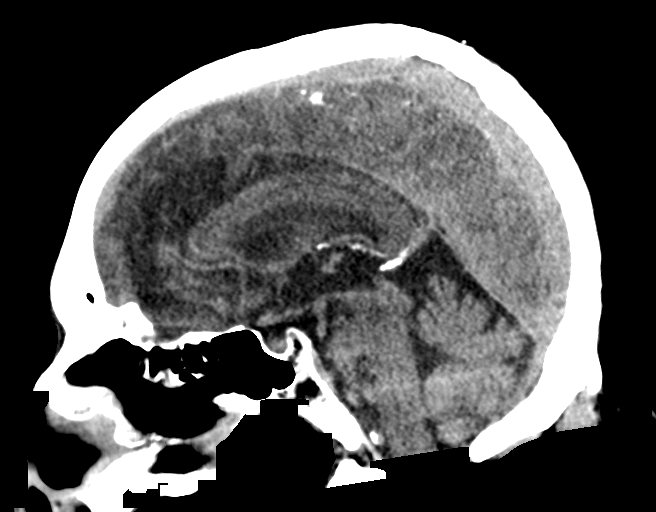

[Series 7: c spine soft · axial · 0.31mm/px · z∈[-291,-243]mm · 3 of 88 slices shown]
[im 8/88  brain]
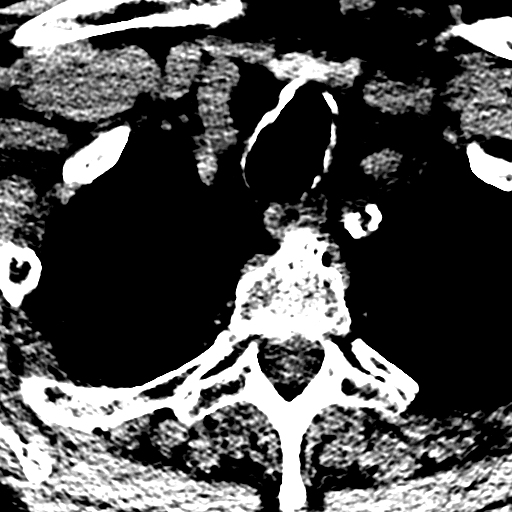
[im 16/88  brain]
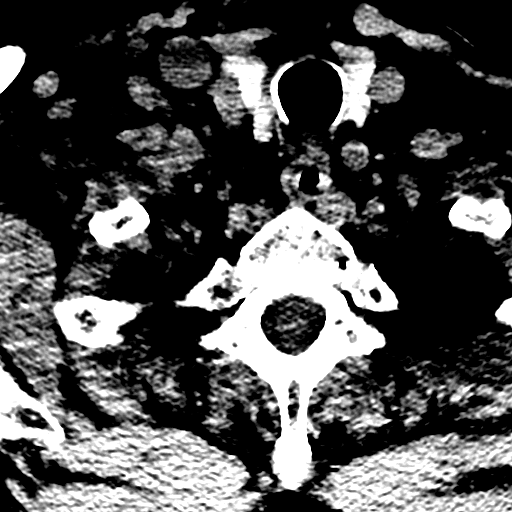
[im 32/88  brain]
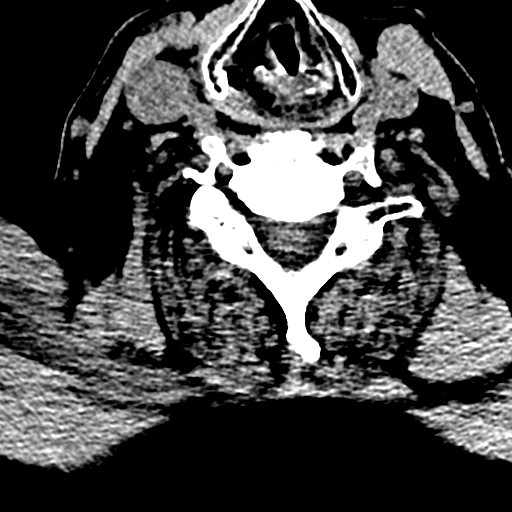

[Series 11: coronal bone · coronal · 0.35mm/px · 3 of 73 slices shown]
[im 21/73  brain]
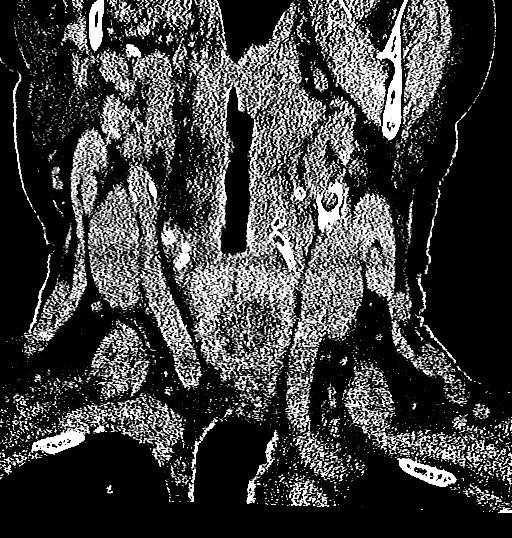
[im 31/73  brain]
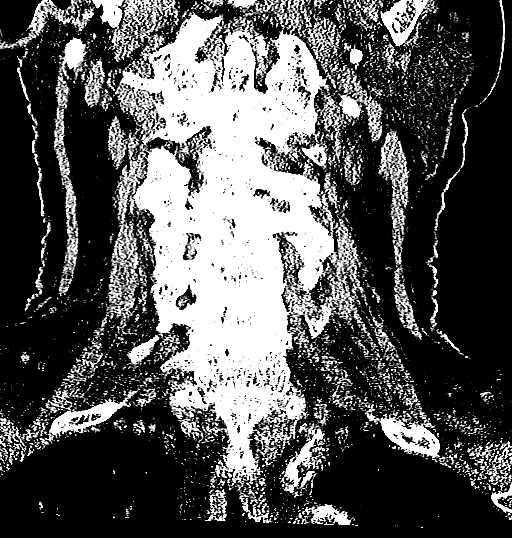
[im 42/73  brain]
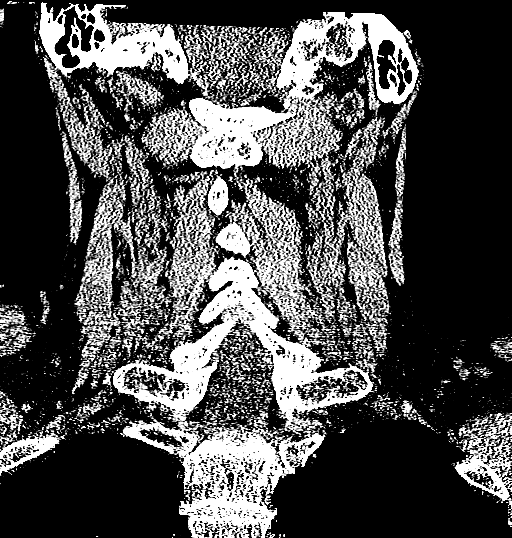

[Series 12: orthogonal bone · axial · 0.30mm/px · z∈[-338,-177]mm · 8 of 102 slices shown]
[im 8/102  bone]
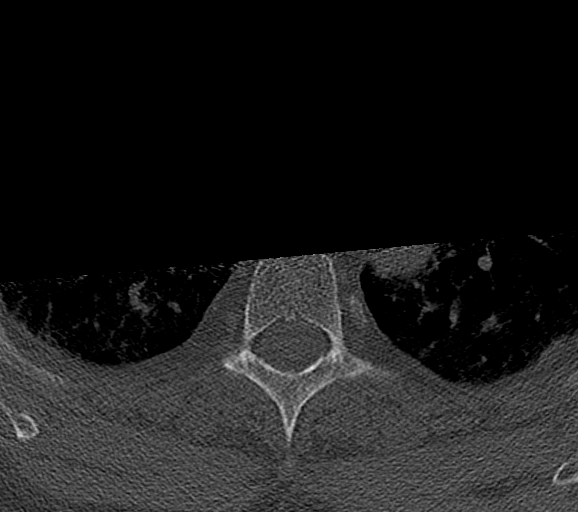
[im 24/102  bone]
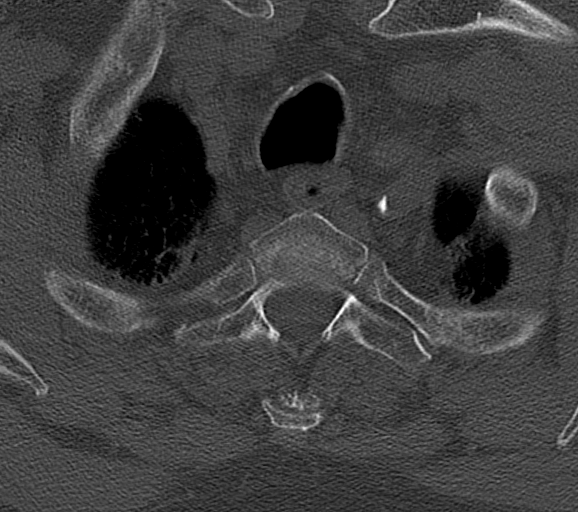
[im 32/102  bone]
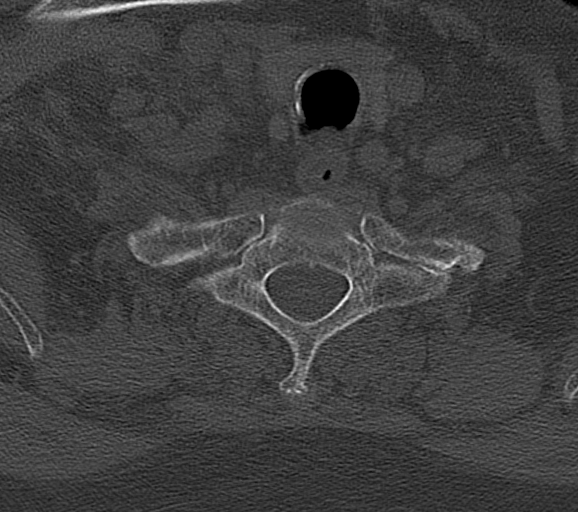
[im 47/102  bone]
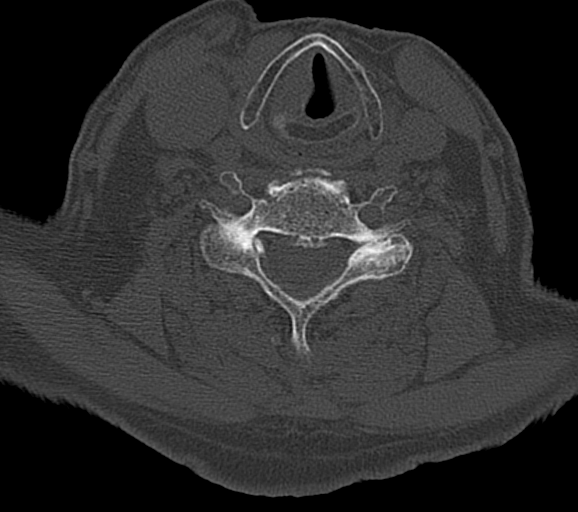
[im 55/102  bone]
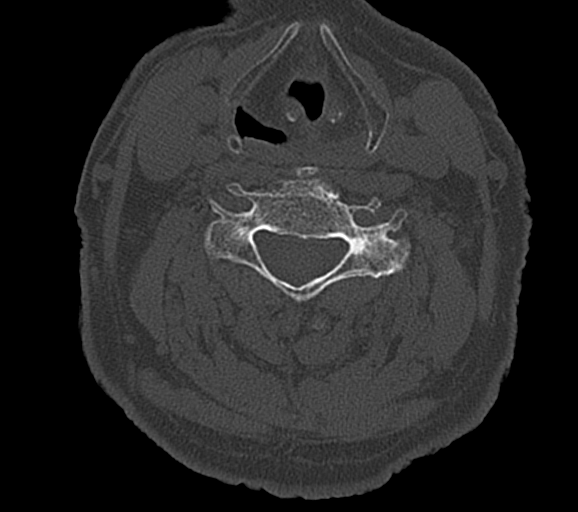
[im 70/102  bone]
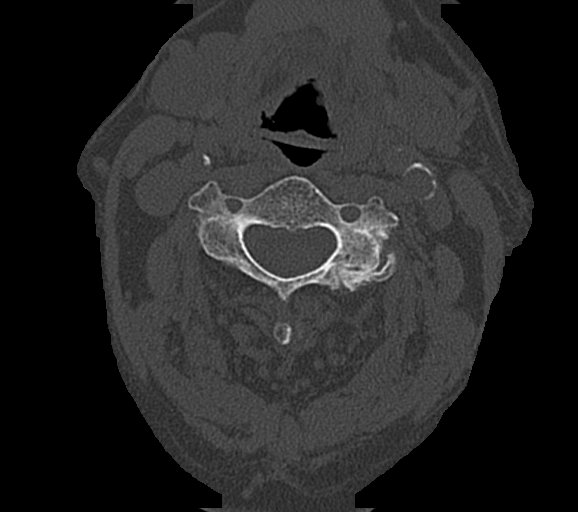
[im 78/102  bone]
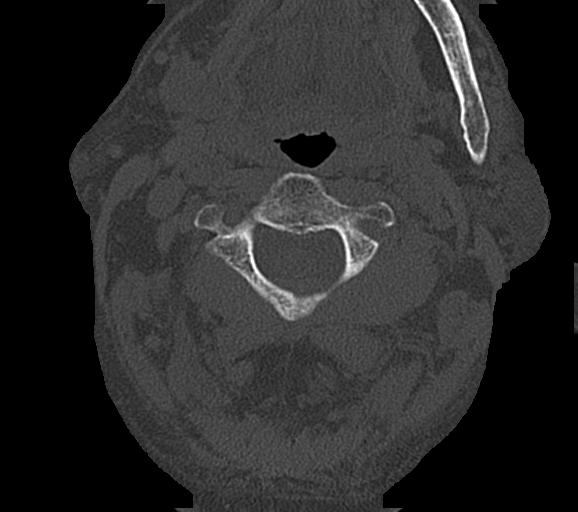
[im 94/102  bone]
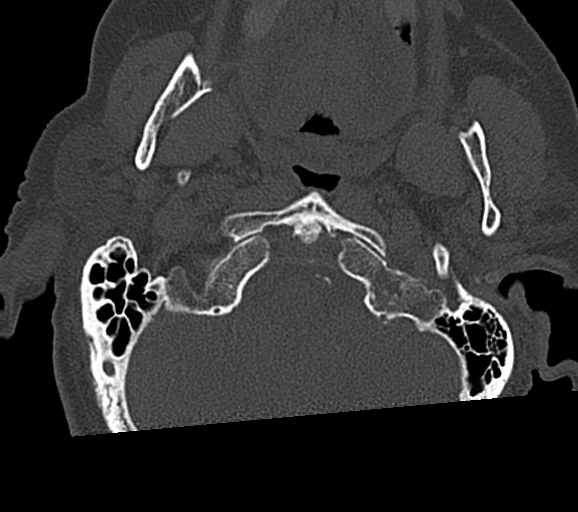

[17 of 47 positions shown; findings below may reference images not displayed]

FINDINGS: CT HEAD FINDINGS

Brain: Mild diffuse cortical atrophy is noted. Mild chronic ischemic
white matter disease is noted. No mass effect or midline shift is
noted. Ventricular size is within normal limits. There is no
evidence of mass lesion, hemorrhage or acute infarction.

Vascular: No hyperdense vessel or unexpected calcification.

Skull: Normal. Negative for fracture or focal lesion.

Sinuses/Orbits: No acute finding.

Other: None.

CT CERVICAL SPINE FINDINGS

Alignment: Normal.

Skull base and vertebrae: No acute fracture. No primary bone lesion
or focal pathologic process.

Soft tissues and spinal canal: No prevertebral fluid or swelling. No
visible canal hematoma.

Disc levels: Severe degenerative disc disease is noted at C3-4, C5-6
and C6-7 with anterior posterior osteophyte formation. Mild
degenerative disc disease is noted at C4-5.

Upper chest: Negative.

Other: Degenerative changes are seen involving posterior facet
joints bilaterally.
IMPRESSION: Mild diffuse cortical atrophy. Mild chronic ischemic white matter
disease. No acute intracranial abnormality seen.

Severe multilevel degenerative disc disease. No acute abnormality
seen in the cervical spine.

## 2021-05-14 ENCOUNTER — Observation Stay: Payer: Medicare Other

## 2021-05-14 ENCOUNTER — Other Ambulatory Visit: Payer: Self-pay

## 2021-05-14 ENCOUNTER — Emergency Department: Payer: Medicare Other

## 2021-05-14 ENCOUNTER — Inpatient Hospital Stay
Admission: EM | Admit: 2021-05-14 | Discharge: 2021-05-17 | DRG: 480 | Disposition: A | Discharge status: Home Health | Payer: Medicare Other | Source: Skilled Nursing Facility | Attending: Obstetrics and Gynecology | Admitting: Obstetrics and Gynecology

## 2021-05-14 DIAGNOSIS — I6529 Occlusion and stenosis of unspecified carotid artery: Secondary | ICD-10-CM | POA: Diagnosis present

## 2021-05-14 DIAGNOSIS — S72144A Nondisplaced intertrochanteric fracture of right femur, initial encounter for closed fracture: Principal | ICD-10-CM | POA: Diagnosis present

## 2021-05-14 DIAGNOSIS — D696 Thrombocytopenia, unspecified: Secondary | ICD-10-CM | POA: Diagnosis present

## 2021-05-14 DIAGNOSIS — Z95828 Presence of other vascular implants and grafts: Secondary | ICD-10-CM

## 2021-05-14 DIAGNOSIS — E43 Unspecified severe protein-calorie malnutrition: Secondary | ICD-10-CM | POA: Diagnosis present

## 2021-05-14 DIAGNOSIS — Z66 Do not resuscitate: Secondary | ICD-10-CM | POA: Diagnosis present

## 2021-05-14 DIAGNOSIS — S7291XA Unspecified fracture of right femur, initial encounter for closed fracture: Secondary | ICD-10-CM | POA: Diagnosis not present

## 2021-05-14 DIAGNOSIS — Z8546 Personal history of malignant neoplasm of prostate: Secondary | ICD-10-CM

## 2021-05-14 DIAGNOSIS — Z951 Presence of aortocoronary bypass graft: Secondary | ICD-10-CM

## 2021-05-14 DIAGNOSIS — E538 Deficiency of other specified B group vitamins: Secondary | ICD-10-CM | POA: Diagnosis present

## 2021-05-14 DIAGNOSIS — I480 Paroxysmal atrial fibrillation: Secondary | ICD-10-CM | POA: Diagnosis present

## 2021-05-14 DIAGNOSIS — W1830XA Fall on same level, unspecified, initial encounter: Secondary | ICD-10-CM | POA: Diagnosis present

## 2021-05-14 DIAGNOSIS — I25119 Atherosclerotic heart disease of native coronary artery with unspecified angina pectoris: Secondary | ICD-10-CM

## 2021-05-14 DIAGNOSIS — R296 Repeated falls: Secondary | ICD-10-CM | POA: Diagnosis present

## 2021-05-14 DIAGNOSIS — Z79899 Other long term (current) drug therapy: Secondary | ICD-10-CM

## 2021-05-14 DIAGNOSIS — I714 Abdominal aortic aneurysm, without rupture, unspecified: Secondary | ICD-10-CM | POA: Diagnosis present

## 2021-05-14 DIAGNOSIS — S72009A Fracture of unspecified part of neck of unspecified femur, initial encounter for closed fracture: Secondary | ICD-10-CM | POA: Diagnosis present

## 2021-05-14 DIAGNOSIS — H9190 Unspecified hearing loss, unspecified ear: Secondary | ICD-10-CM | POA: Diagnosis present

## 2021-05-14 DIAGNOSIS — Z85038 Personal history of other malignant neoplasm of large intestine: Secondary | ICD-10-CM

## 2021-05-14 DIAGNOSIS — I739 Peripheral vascular disease, unspecified: Secondary | ICD-10-CM | POA: Diagnosis present

## 2021-05-14 DIAGNOSIS — E785 Hyperlipidemia, unspecified: Secondary | ICD-10-CM | POA: Diagnosis present

## 2021-05-14 DIAGNOSIS — M25551 Pain in right hip: Secondary | ICD-10-CM

## 2021-05-14 DIAGNOSIS — G3183 Dementia with Lewy bodies: Secondary | ICD-10-CM | POA: Diagnosis present

## 2021-05-14 DIAGNOSIS — I2510 Atherosclerotic heart disease of native coronary artery without angina pectoris: Secondary | ICD-10-CM | POA: Diagnosis present

## 2021-05-14 DIAGNOSIS — K219 Gastro-esophageal reflux disease without esophagitis: Secondary | ICD-10-CM | POA: Diagnosis present

## 2021-05-14 DIAGNOSIS — I10 Essential (primary) hypertension: Secondary | ICD-10-CM | POA: Diagnosis present

## 2021-05-14 DIAGNOSIS — G4700 Insomnia, unspecified: Secondary | ICD-10-CM | POA: Diagnosis present

## 2021-05-14 DIAGNOSIS — Z888 Allergy status to other drugs, medicaments and biological substances status: Secondary | ICD-10-CM

## 2021-05-14 DIAGNOSIS — D62 Acute posthemorrhagic anemia: Secondary | ICD-10-CM | POA: Diagnosis present

## 2021-05-14 DIAGNOSIS — F419 Anxiety disorder, unspecified: Secondary | ICD-10-CM | POA: Diagnosis present

## 2021-05-14 DIAGNOSIS — G4733 Obstructive sleep apnea (adult) (pediatric): Secondary | ICD-10-CM | POA: Diagnosis present

## 2021-05-14 DIAGNOSIS — F418 Other specified anxiety disorders: Secondary | ICD-10-CM | POA: Diagnosis present

## 2021-05-14 DIAGNOSIS — Z79891 Long term (current) use of opiate analgesic: Secondary | ICD-10-CM

## 2021-05-14 DIAGNOSIS — I6523 Occlusion and stenosis of bilateral carotid arteries: Secondary | ICD-10-CM

## 2021-05-14 DIAGNOSIS — Z20822 Contact with and (suspected) exposure to covid-19: Secondary | ICD-10-CM | POA: Diagnosis present

## 2021-05-14 DIAGNOSIS — Z419 Encounter for procedure for purposes other than remedying health state, unspecified: Secondary | ICD-10-CM

## 2021-05-14 DIAGNOSIS — M810 Age-related osteoporosis without current pathological fracture: Secondary | ICD-10-CM | POA: Diagnosis present

## 2021-05-14 DIAGNOSIS — S300XXA Contusion of lower back and pelvis, initial encounter: Secondary | ICD-10-CM | POA: Diagnosis present

## 2021-05-14 DIAGNOSIS — S72001A Fracture of unspecified part of neck of right femur, initial encounter for closed fracture: Secondary | ICD-10-CM

## 2021-05-14 DIAGNOSIS — J309 Allergic rhinitis, unspecified: Secondary | ICD-10-CM | POA: Diagnosis present

## 2021-05-14 DIAGNOSIS — Z885 Allergy status to narcotic agent status: Secondary | ICD-10-CM

## 2021-05-14 DIAGNOSIS — Z85828 Personal history of other malignant neoplasm of skin: Secondary | ICD-10-CM

## 2021-05-14 DIAGNOSIS — F0280 Dementia in other diseases classified elsewhere without behavioral disturbance: Secondary | ICD-10-CM | POA: Diagnosis present

## 2021-05-14 DIAGNOSIS — J432 Centrilobular emphysema: Secondary | ICD-10-CM | POA: Diagnosis present

## 2021-05-14 DIAGNOSIS — Z681 Body mass index (BMI) 19 or less, adult: Secondary | ICD-10-CM

## 2021-05-14 DIAGNOSIS — E039 Hypothyroidism, unspecified: Secondary | ICD-10-CM | POA: Diagnosis present

## 2021-05-14 DIAGNOSIS — Z87891 Personal history of nicotine dependence: Secondary | ICD-10-CM

## 2021-05-14 LAB — CBC WITH DIFFERENTIAL/PLATELET
Abs Immature Granulocytes: 0.04 10*3/uL (ref 0.00–0.07)
Basophils Absolute: 0 10*3/uL (ref 0.0–0.1)
Basophils Relative: 1 %
Eosinophils Absolute: 0.3 10*3/uL (ref 0.0–0.5)
Eosinophils Relative: 4 %
HCT: 34.6 % — ABNORMAL LOW (ref 39.0–52.0)
Hemoglobin: 11.6 g/dL — ABNORMAL LOW (ref 13.0–17.0)
Immature Granulocytes: 1 %
Lymphocytes Relative: 27 %
Lymphs Abs: 2.2 10*3/uL (ref 0.7–4.0)
MCH: 32.4 pg (ref 26.0–34.0)
MCHC: 33.5 g/dL (ref 30.0–36.0)
MCV: 96.6 fL (ref 80.0–100.0)
Monocytes Absolute: 0.9 10*3/uL (ref 0.1–1.0)
Monocytes Relative: 11 %
Neutro Abs: 4.6 10*3/uL (ref 1.7–7.7)
Neutrophils Relative %: 56 %
Platelets: 99 10*3/uL — ABNORMAL LOW (ref 150–400)
RBC: 3.58 MIL/uL — ABNORMAL LOW (ref 4.22–5.81)
RDW: 16.6 % — ABNORMAL HIGH (ref 11.5–15.5)
Smear Review: DECREASED
WBC: 8 10*3/uL (ref 4.0–10.5)
nRBC: 0 % (ref 0.0–0.2)

## 2021-05-14 LAB — BASIC METABOLIC PANEL
Anion gap: 6 (ref 5–15)
BUN: 19 mg/dL (ref 8–23)
CO2: 27 mmol/L (ref 22–32)
Calcium: 8.8 mg/dL — ABNORMAL LOW (ref 8.9–10.3)
Chloride: 108 mmol/L (ref 98–111)
Creatinine, Ser: 0.79 mg/dL (ref 0.61–1.24)
GFR, Estimated: >60 mL/min (ref >60)
Glucose, Bld: 98 mg/dL (ref 70–99)
Potassium: 4.1 mmol/L (ref 3.5–5.1)
Sodium: 141 mmol/L (ref 135–145)

## 2021-05-14 LAB — URINALYSIS, COMPLETE (UACMP) WITH MICROSCOPIC
Bacteria, UA: NONE SEEN
Bilirubin Urine: NEGATIVE
Glucose, UA: NEGATIVE mg/dL
Hgb urine dipstick: NEGATIVE
Ketones, ur: NEGATIVE mg/dL
Leukocytes,Ua: NEGATIVE
Nitrite: NEGATIVE
Protein, ur: NEGATIVE mg/dL
Specific Gravity, Urine: 1.018 (ref 1.005–1.030)
Squamous Epithelial / HPF: NONE SEEN (ref 0–5)
pH: 6 (ref 5.0–8.0)

## 2021-05-14 LAB — PROCALCITONIN: Procalcitonin: <0.1 ng/mL

## 2021-05-14 MED ORDER — VITAMIN D 25 MCG (1000 UNIT) PO TABS
2000.0000 [IU] | ORAL_TABLET | Freq: Every day | ORAL | Status: DC
  Administered 2021-05-16 – 2021-05-17 (×2): 2000 [IU] via ORAL
  Filled 2021-05-14 (×2): qty 2

## 2021-05-14 MED ORDER — EZETIMIBE 10 MG PO TABS
10.0000 mg | ORAL_TABLET | Freq: Every day | ORAL | Status: DC
  Administered 2021-05-16 – 2021-05-17 (×2): 10 mg via ORAL
  Filled 2021-05-14 (×3): qty 1

## 2021-05-14 MED ORDER — KETOROLAC TROMETHAMINE 15 MG/ML IJ SOLN
15.0000 mg | Freq: Four times a day (QID) | INTRAMUSCULAR | Status: AC | PRN
  Administered 2021-05-14: 15 mg via INTRAVENOUS
  Filled 2021-05-14 (×2): qty 1

## 2021-05-14 MED ORDER — QUETIAPINE FUMARATE 25 MG PO TABS
25.0000 mg | ORAL_TABLET | Freq: Every day | ORAL | Status: DC
  Administered 2021-05-15 – 2021-05-16 (×2): 25 mg via ORAL
  Filled 2021-05-14 (×2): qty 1

## 2021-05-14 MED ORDER — TRAZODONE HCL 50 MG PO TABS
50.0000 mg | ORAL_TABLET | Freq: Every day | ORAL | Status: DC
  Administered 2021-05-14 – 2021-05-16 (×3): 50 mg via ORAL
  Filled 2021-05-14 (×3): qty 1

## 2021-05-14 MED ORDER — MAGIC MOUTHWASH
30.0000 mL | Freq: Four times a day (QID) | ORAL | Status: DC | PRN
  Filled 2021-05-14: qty 30

## 2021-05-14 MED ORDER — MORPHINE SULFATE (PF) 2 MG/ML IV SOLN
0.5000 mg | INTRAVENOUS | Status: DC | PRN
  Administered 2021-05-15: 0.5 mg via INTRAVENOUS
  Filled 2021-05-14 (×2): qty 1

## 2021-05-14 MED ORDER — MIRTAZAPINE 15 MG PO TABS
7.5000 mg | ORAL_TABLET | Freq: Every day | ORAL | Status: DC
  Administered 2021-05-14 – 2021-05-16 (×3): 7.5 mg via ORAL
  Filled 2021-05-14 (×3): qty 1

## 2021-05-14 MED ORDER — DONEPEZIL HCL 5 MG PO TABS
10.0000 mg | ORAL_TABLET | Freq: Every day | ORAL | Status: DC
  Administered 2021-05-14 – 2021-05-16 (×3): 10 mg via ORAL
  Filled 2021-05-14 (×3): qty 2

## 2021-05-14 MED ORDER — ROSUVASTATIN CALCIUM 10 MG PO TABS
20.0000 mg | ORAL_TABLET | Freq: Every day | ORAL | Status: DC
  Administered 2021-05-16 – 2021-05-17 (×2): 20 mg via ORAL
  Filled 2021-05-14 (×2): qty 2

## 2021-05-14 MED ORDER — SODIUM CHLORIDE 0.9 % IV BOLUS
1000.0000 mL | Freq: Once | INTRAVENOUS | Status: AC
  Administered 2021-05-14: 1000 mL via INTRAVENOUS

## 2021-05-14 MED ORDER — FINASTERIDE 5 MG PO TABS
5.0000 mg | ORAL_TABLET | Freq: Every day | ORAL | Status: DC
  Administered 2021-05-15 – 2021-05-17 (×3): 5 mg via ORAL
  Filled 2021-05-14 (×3): qty 1

## 2021-05-14 MED ORDER — MIRABEGRON ER 50 MG PO TB24
50.0000 mg | ORAL_TABLET | Freq: Every day | ORAL | Status: DC
  Administered 2021-05-16 – 2021-05-17 (×2): 50 mg via ORAL
  Filled 2021-05-14 (×3): qty 1

## 2021-05-14 MED ORDER — ENSURE ENLIVE PO LIQD
237.0000 mL | Freq: Three times a day (TID) | ORAL | Status: DC
  Administered 2021-05-15 – 2021-05-17 (×5): 237 mL via ORAL
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
## 2021-05-14 NOTE — ED Provider Notes (Signed)
[facility] Emergency Department Provider Note   ____________________________________________   Event Date/Time   First MD Initiated Contact with Patient 05/14/21 1413     (approximate)  I have reviewed the triage vital signs and the nursing notes.   HISTORY  Chief Complaint Hip Pain  Interim history provided by patient's adult daughter was present in the room.  HPI [name] is a 87 y.o. male with the below medical history, presents to the ED via EMS from his facility.  Patient apparently had a mechanical fall on Sunday while with his family.  Apparently his adult daughter who was walking with him, the patient slowly sat down on the ground, after twisting on planted feet.  He did not fall out right.  He endorsed some low back muscle type pain after nursing staff was able to get him upright into a chair.  It was not until he continued to complain of pain overnight, that the facility finally received an order for imaging.  The facility confirmed a right hip fracture with a portable x-ray last night.  Patient was not transported to the ED yesterday initially, because family was not aware of fracture. He presents today endorsing right hip pain.  He denies any other injury at this time.  There is also some concern for possible UTI, as the patient only was agitated with residents last week.  Past Medical History:  Diagnosis Date   AAA (abdominal aortic aneurysm) ([place]) 05/2019   Relativley Rapid progression from June-Aug 2020 (4.6 - 5.4 cm): s/p EVAR (Dr. Lucky Cow[facility]): EVAR - 23 mm prox, 12 cm distal & x 18 cm length Gore Excluder Endoprosthesis Main body (via RFA distal to lowest Renal A); 74m x 14 cm L Contralateral Limb for L Iliac - extended with 8 mm x 6cm LifeStar stent (post-dilated with 7 mm DEB).  Additional R Iliac PTA not required.    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 ([place] 09/15/2017   09-15-2017 DIAGNOSIS:  A. COLON POLYP X 2, ASCENDING; COLD SNARE:  -  TUBULAR ADENOMA.  - NEGATIVE FOR HIGH-GRADE DYSPLASIA AND MALIGNANCY.  - NO ADDITIONAL TISSUE IDENTIFIED; FECAL MATERIAL PRESENT.   B. COLON POLYP X 2, DESCENDING; COLD SNARE:  - ADENOCARCINOMA ARISING IN A DYSPLASTIC SESSILE SERRATED ADENOMA, WITH  LYMPHOVASCULAR INVASION, SEE COMMENT.  - TUBULAR ADENOMA, 2 FRAGMENTS, NEGATIVE FO   Arthritis    Basal cell carcinoma of eyelid 01/03/2013   2019 - R side of Nose   Benign neoplasm of ascending colon    Benign neoplasm of descending colon    Bilateral iliac artery stenosis ([place] 2007   Bilateral Iliac A stenting; extension of EVAR limbs into both Iliacs -- additional stent placed in L Iliac.   Cancer ([facility]    Skin cancer   Colon polyps    Complication of anesthesia    severe confusion agitation requiring hospital admission x 4 days   Compression fracture of fourth lumbar vertebra ([place] 04/2019   - s/p Kyphoplasty; T12, L3-5 ([facility]   COPD (chronic obstructive pulmonary disease) (HCC)    Centrilobular Emphysema   Coronary artery disease involving native coronary artery without angina pectoris 1998   - s/p CABG, occluded SVG-D1; patent LIMA-LAD, SVG-OM, SVG- RCA   Dementia ([place]    memory loss - started noting late 2019   Falls frequently    broke foot   Fracture 2021   left foot   GERD (gastroesophageal reflux disease)    Hemorrhoid 02/10/2015  History of hiatal hernia    History of kidney stones    HOH (hard of hearing)    Hypertension    Lewy body dementia ([place]) 04/04/2020   Macular degeneration disease    Osteoporosis    PAF (paroxysmal atrial fibrillation) (HCC)    on Eliquis for OAC - Rate Control    Prostate cancer ([place])    Prostate disorder    Sciatic pain    Chronic   Sleep apnea    cpap   Temporary cerebral vascular dysfunction 02/10/2015   Had negative work-up.  July, 2012.  No medication changes, had forgot them that day, got dehydrated.    Urinary incontinence     Patient Active Problem List   Diagnosis Date  Noted   Femur fracture, right ([place]) 05/14/2021   Acute cystitis with hematuria 03/02/2021   UTI (urinary tract infection) 03/01/2021   CAD (coronary artery disease) 03/01/2021   Depression with anxiety 03/01/2021   Protein-calorie malnutrition, severe 01/25/2021   Accidental fall 01/24/2021   Closed left hip fracture, initial encounter ([place]) 01/24/2021   Closed fracture of left distal radius 01/24/2021   Dementia without behavioral disturbance ([place]) 01/24/2021   Pubic ramus fracture, left, closed, initial encounter ([place]) 01/24/2021   Hip fracture ([place]) 01/24/2021   AAA (abdominal aortic aneurysm) without rupture ([place]) 06/29/2020   LBD (Lewy body dementia) ([place]) 05/14/2020   Numbness and tingling in left hand 03/02/2020   Hematuria    Acute kidney injury superimposed on CKD ([place])    Sepsis due to gram-negative UTI ([place]) 12/26/2019   Left nephrolithiasis 12/26/2019   Hydronephrosis, left 12/26/2019   Status post abdominal aortic aneurysm (AAA) repair 04/15/2019   Do not intubate, cardiopulmonary resuscitation (CPR)-only code status 10/08/2018   Prostate cancer ([place]) 02/25/2018   Prostatic cyst 01/15/2018   Blood in stool    Abnormal feces    Stricture and stenosis of esophagus    Mild cognitive impairment 08/18/2017   Senile purpura ([place]) 04/29/2017   Normocytic anemia 04/29/2017   Frequent falls 04/29/2017   Simple chronic bronchitis ([place]) 12/17/2016   Benign localized hyperplasia of prostate with urinary obstruction 07/15/2016   Elevated PSA 07/15/2016   Incomplete emptying of bladder 07/15/2016   Urge incontinence 07/15/2016   Hypothyroidism 04/25/2016   Macular degeneration 04/25/2016   Erectile dysfunction due to arterial insufficiency    Ischemic heart disease with chronotropic incompetence    CN (constipation) 02/10/2015   DD (diverticular disease) 02/10/2015   Weakness 68/07/5725   Lichen planus 20/35/5974   Restless leg 02/10/2015   Circadian rhythm disorder  02/10/2015   B12 deficiency 02/10/2015   COPD (chronic obstructive pulmonary disease) ([place]) 11/14/2014   Post Inflammatory Lung Changes 11/14/2014   PAF (paroxysmal atrial fibrillation) (HCC) CHA2DS2-VASc = 4. AC = Eliquis    Insomnia 12/09/2013   OSA on CPAP    Dyspnea on exertion - -essentially resolved with BB dose reduction & wgt loss 03/29/2013   Overweight (BMI 25.0-29.9) -- Wgt back up 01/17/2013   Hx of CABG    Essential hypertension    Hyperlipidemia with target LDL less than 70    Aorto-iliac disease ([place])    BCC (basal cell carcinoma), eyelid 01/03/2013   Allergic rhinitis 08/17/2009   Adaptation reaction 05/28/2009   Acid reflux 05/28/2009   Arthritis, degenerative 05/28/2009   Abnormality of aortic arch branch 06/01/2006   Carotid artery occlusion without infarction 06/01/2006   PAD (peripheral artery disease) - bilateral common iliac stents 12/15/2005  Atherosclerotic heart disease of artery bypass graft 09/01/1997    Past Surgical History:  Procedure Laterality Date   ABDOMINAL AORTIC ENDOVASCULAR STENT GRAFT Bilateral 05/18/2019   Procedure: ABDOMINAL AORTIC ENDOVASCULAR STENT GRAFT;  Surgeon: Algernon Huxley, MD;  Location: [facility] ORS;  Service: Vascular;  Laterality: Bilateral;   ANKLE SURGERY Left    ORIF    Arch aortogram and carotid aortogram  06/02/2006   Dr Oneida Alar did surgery   BASAL CELL CARCINOMA EXCISION  04/2016   Dermatology   CARDIAC CATHETERIZATION  01/30/1998    (Dr. Linard Millers): Native LAD & mRCA CTO. 90% D1. Mod Cx. SVG-D1 CTO. SVG-RCA & SVG-OM along with LIMA-LAD patent;  LV NORMAL Fxn   CAROTID ENDARTERECTOMY Left Oct. 29, 2007   Dr. Oneida Alar:   CATARACT EXTRACTION W/PHACO Right 03/05/2017   Procedure: CATARACT EXTRACTION PHACO AND INTRAOCULAR LENS PLACEMENT ([place]);  Surgeon: Leandrew Koyanagi, MD;  Location: [facility] ORS;  Service: Ophthalmology;  Laterality: Right;  [place] 00:58.5 AP% 10.6 CDE 6.20 Fluid Pack lot # [id number]   CATARACT EXTRACTION W/PHACO  Left 04/15/2017   Procedure: CATARACT EXTRACTION PHACO AND INTRAOCULAR LENS PLACEMENT ([place])  Left;  Surgeon: Leandrew Koyanagi, MD;  Location: [facility];  Service: Ophthalmology;  Laterality: Left;   COLONOSCOPY WITH PROPOFOL N/A 09/15/2017   Procedure: COLONOSCOPY WITH PROPOFOL;  Surgeon: Lucilla Lame, MD;  [facility];  Service: Endoscopy;  Laterality: N/A;   COLONOSCOPY WITH PROPOFOL N/A 10/26/2018   Procedure: COLONOSCOPY WITH PROPOFOL;  Surgeon: Lucilla Lame, MD;  Location: [facility] ENDOSCOPY;  Service: Endoscopy;  Laterality: N/A;   CORONARY ARTERY BYPASS GRAFT  1998   LIMA-LAD, SVG-OM, SVG-RPDA, SVG-DIAG   CPET / MET  12/2012   Mild chronotropic incompetence - read 82% of predicted; also reduced effort; peak VO2 15.7 / 75% (did not reach Max effort) -- suggested ischemic response in last 1.5 minutes of exercise. Normal pulmonary function on PFTs but poor response to   CYSTOSCOPY W/ RETROGRADES Left 01/13/2020   Procedure: CYSTOSCOPY WITH RETROGRADE PYELOGRAM;  Surgeon: Billey Co, MD;  Location: [facility] ORS;  Service: Urology;  Laterality: Left;   CYSTOSCOPY WITH STENT PLACEMENT Left 12/26/2019   Procedure: CYSTOSCOPY WITH STENT PLACEMENT;  Surgeon: Billey Co, MD;  Location: [facility] ORS;  Service: Urology;  Laterality: Left;   CYSTOSCOPY/URETEROSCOPY/HOLMIUM LASER/STENT PLACEMENT Left 01/13/2020   Procedure: CYSTOSCOPY/URETEROSCOPY/HOLMIUM LASER/STENT EXCHANGE;  Surgeon: Billey Co, MD;  Location: [facility] ORS;  Service: Urology;  Laterality: Left;   ENDOVASCULAR REPAIR/STENT GRAFT N/A 05/18/2019   Procedure: ENDOVASCULAR REPAIR/STENT GRAFT;  Surgeon: Algernon Huxley, MD;  Location: [facility] INVASIVE CV LAB;; EVAR - 23 mm prox, 12 cm distal & x 18 cm length Gore Excluder Endoprosthesis Main body (via RFA distal to lowest Renal A); 22m x 14 cm L Contralateral Limb for L Iliac - extended with 8 mm x 6cm LifeStar stent; no additional R Iliac PTA needed.     ESOPHAGOGASTRODUODENOSCOPY (EGD) WITH PROPOFOL N/A 09/15/2017   Procedure: ESOPHAGOGASTRODUODENOSCOPY (EGD) WITH PROPOFOL;  Surgeon: WLucilla Lame MD;  [facility];  Service: Endoscopy;  Laterality: N/A;   FRACTURE SURGERY Right 03/19/2015   wrist   FRACTURE SURGERY Left    HIATAL HERNIA REPAIR     ILIAC ARTERY STENT  12/15/2005   PTA and direct stenting rgt and lft common iliac arteries   INCISION AND DRAINAGE OF WOUND Left 01/29/2021   Procedure: IRRIGATION AND DEBRIDEMENT Left Wrist and Removal of Plate;  Surgeon: KThornton Park MD;  Location:  [facility] ORS;  Service: Orthopedics;  Laterality: Left;   INTRAMEDULLARY (IM) NAIL INTERTROCHANTERIC Left 01/29/2021   Procedure: INTRAMEDULLARY (IM) NAIL INTERTROCHANTRIC;  Surgeon: Thornton Park, MD;  Location: [facility] ORS;  Service: Orthopedics;  Laterality: Left;   KYPHOPLASTY N/A 04/05/2019   Procedure: KYPHOPLASTY T12, L3, L4 L5;  Surgeon: Hessie Knows, MD;  Location: [facility] ORS;  Service: Orthopedics;  Laterality: N/A;   NM GATED MYOVIEW ([place] [facility])  05/'19; 8/'20   a) [facility]: Low Risk - no ischemia or infarct.  EF > 65%. no RWMA;; b) [facility]): Normal wall motion.  No ischemia or infarction.   OPEN REDUCTION INTERNAL FIXATION (ORIF) DISTAL RADIAL FRACTURE Left 01/13/2019   Procedure: OPEN REDUCTION INTERNAL FIXATION (ORIF) DISTAL  RADIAL FRACTURE - LEFT - SLEEP APNEA;  Surgeon: Hessie Knows, MD;  Location: [facility] ORS;  Service: Orthopedics;  Laterality: Left;   OPEN REDUCTION INTERNAL FIXATION (ORIF) DISTAL RADIAL FRACTURE Left 01/29/2021   Procedure: OPEN REDUCTION INTERNAL FIXATION (ORIF) DISTAL RADIAL FRACTURE;  Surgeon: Thornton Park, MD;  Location: [facility] ORS;  Service: Orthopedics;  Laterality: Left;   ORIF ELBOW FRACTURE Right 01/01/2017   Procedure: OPEN REDUCTION INTERNAL FIXATION (ORIF) ELBOW/OLECRANON FRACTURE;  Surgeon: Hessie Knows, MD;  Location: [facility] ORS;  Service: Orthopedics;  Laterality: Right;   ORIF WRIST FRACTURE Right  03/19/2015   Procedure: OPEN REDUCTION INTERNAL FIXATION (ORIF) WRIST FRACTURE;  Surgeon: Hessie Knows, MD;  Location: [facility] ORS;  Service: Orthopedics;  Laterality: Right;   SHOULDER ARTHROSCOPY Right    THORACIC AORTA - CAROTID ANGIOGRAM  October 2007   Dr. 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 Dilated left atrium.   TRANSTHORACIC ECHOCARDIOGRAM  04/2019   [facility] April 20, 2019): EF 55-60 %.  Mild LVH.  Relatively normal valves.    Prior to Admission medications   Medication Sig Start Date End Date Taking? Authorizing Provider  acetaminophen (TYLENOL) 500 MG tablet Take 500-1,000 mg by mouth every 6 (six) hours as needed for mild pain or moderate pain.    [provider]  Amino Acids-Protein Hydrolys (FEEDING SUPPLEMENT, PRO-STAT SUGAR FREE 64,) LIQD Take 30 mLs by mouth 2 (two) times daily.    [provider]  bacitracin ointment Apply 1 application topically every evening. Apply to right eyelid topically for 4 weeks in the evening after instillation of Systane eye drops.    [provider]  benzocaine (HURRICAINE) 20 % GEL Use as directed 1 application in the mouth or throat 4 (four) times daily.    [provider]  Cholecalciferol 50 MCG (2000 UT) TBDP Take 2,000 Units by mouth daily.    [provider]  donepezil (ARICEPT) 10 MG tablet Take 10 mg by mouth at bedtime. 12/26/20   [provider]  ezetimibe (ZETIA) 10 MG tablet TAKE 1 TABLET BY MOUTH DAILY 12/21/20   [name], MD  feeding supplement (ENSURE ENLIVE / ENSURE PLUS) LIQD Take 237 mLs by mouth 3 (three) times daily between meals. 03/05/21   Nicole Kindred A, DO  finasteride (PROSCAR) 5 MG tablet TAKE ONE TABLET BY MOUTH EVERY DAY 12/21/20   Stoioff, Ronda Fairly, MD  fluticasone (FLONASE) 50 MCG/ACT nasal  spray PLACE TWO SPRAYS IN BOTH NOSRTILS EVERY DAY AS NEEDED FOR ALLERGIES 08/07/20   [name], MD  fluticasone-salmeterol (ADVAIR) 250-50 MCG/ACT AEPB Inhale 1 puff into the lungs  in the morning and at bedtime.    [provider]  magic mouthwash SOLN Take 30 mLs by mouth 4 (four) times daily.    [provider]  Magnesium Oxide 500 MG TABS Take 500 mg by mouth every evening.    [provider]  mirtazapine (REMERON) 7.5 MG tablet Take 7.5 mg by mouth at bedtime. 12/26/20   [provider]  Multiple Vitamin (MULTIVITAMIN WITH MINERALS) TABS tablet Take 1 tablet by mouth daily.    [provider]  MYRBETRIQ 50 MG TB24 tablet TAKE ONE TABLET BY MOUTH EVERY DAY 12/21/20   Stoioff, Ronda Fairly, MD  Polyethyl Glycol-Propyl Glycol (SYSTANE) 0.4-0.3 % SOLN Place 1 drop into both eyes at bedtime.    [provider]  rosuvastatin (CRESTOR) 20 MG tablet TAKE ONE TABLET BY MOUTH EVERY DAY 12/21/20   [name], MD  traZODone (DESYREL) 50 MG tablet Take 50 mg by mouth at bedtime. 12/24/20   [provider]  vitamin C (ASCORBIC ACID) 500 MG tablet Take 500 mg by mouth 2 (two) times daily.    [provider]    Allergies Clonazepam, Codeine, Ropinirole, Hydrocodone, Niacin and related, Oxycodone, and Tramadol  Family History  Problem Relation Age of Onset   Ulcers Mother        Peptic   Dementia Mother    Alcohol abuse Father     Social History Social History   Tobacco Use   Smoking status: Former    Types: Cigarettes    Quit date: 08/04/1997    Years since quitting: 23.7   Smokeless tobacco: Never  Vaping Use   Vaping Use: Never used  Substance Use Topics   Alcohol use: No   Drug use: No    Review of Systems  Constitutional: No fever/chills Eyes: No visual changes. ENT: No sore throat. Cardiovascular: Denies chest pain. Respiratory: Denies shortness of breath. Gastrointestinal: No abdominal pain.  No nausea,  no vomiting.  No diarrhea.  No constipation. Genitourinary: Negative for dysuria. Malodorous urine Musculoskeletal: Negative for back pain.  Right hip pain as above. Skin: Negative for rash. Neurological: Negative for headaches, focal weakness or numbness. ____________________________________________   PHYSICAL EXAM:  VITAL SIGNS: ED Triage Vitals  Enc Vitals Group     BP 05/14/21 1421 (!) 153/90     Pulse Rate 05/14/21 1421 86     Resp 05/14/21 1421 16     Temp 05/14/21 1421 97.6 F (36.4 C)     Temp Source 05/14/21 1421 Oral     SpO2 05/14/21 1421 99 %     Weight 05/14/21 1418 128 lb 8.5 oz (58.3 kg)     Height 05/14/21 1421 6' (1.829 m)     Head Circumference --      Peak Flow --      Pain Score --      Pain Loc --      Pain Edu? --      Excl. in [place]? --     Constitutional: Alert and oriented. Well appearing and in no acute distress. Eyes: Conjunctivae are normal. EOMI. Head: Atraumatic. Mouth/Throat: Mucous membranes are dry.  Oropharynx non-erythematous. Neck: No stridor.   Cardiovascular: Normal rate, regular rhythm. Grossly normal heart sounds.  Good peripheral circulation. Respiratory: Normal respiratory effort.  No retractions. Lungs CTAB. Gastrointestinal: Soft and nontender. No distention. No abdominal bruits. No CVA tenderness. Musculoskeletal: No lower extremity tenderness nor edema.  No joint effusions. Tender to palp at the right  trochanteric hip. No obvious deformity noted. Neurologic:  Normal speech and language. No gross focal neurologic deficits are appreciated.  Skin:  Skin is warm, dry and intact. No rash noted. Psychiatric: Mood and affect are normal. Speech and behavior are normal.  ____________________________________________   LABS (all labs ordered are listed, but only abnormal results are displayed)  Labs Reviewed  BASIC METABOLIC PANEL - Abnormal; Notable for the following components:      Result Value   Calcium 8.8 (*)    All other  components within normal limits  CBC WITH DIFFERENTIAL/PLATELET - Abnormal; Notable for the following components:   RBC 3.58 (*)    Hemoglobin 11.6 (*)    HCT 34.6 (*)    RDW 16.6 (*)    Platelets 99 (*)    All other components within normal limits  SARS CORONAVIRUS 2 (TAT 6-24 HRS)  URINALYSIS, COMPLETE (UACMP) WITH MICROSCOPIC  PROCALCITONIN  BASIC METABOLIC PANEL  CBC   ____________________________________________  EKG  Vent. rate 83 BPM PR interval * ms QRS duration 120 ms QT/QTcB 394/463 ms P-R-T axes * 71 48 ____________________________________________  RADIOLOGY I, Melvenia Needles, personally viewed and evaluated these images (plain radiographs) as part of my medical decision making, as well as reviewing the written report by the radiologist.  ED MD interpretation:  agree with reports  Official radiology report(s): DG Chest Portable 1 View  Result Date: 05/14/2021 CLINICAL DATA:  Recent fall with history of hip fracture EXAM: PORTABLE CHEST 1 VIEW COMPARISON:  02/02/2021 FINDINGS: Cardiac shadow is within normal limits. Postsurgical changes are noted. The lungs are well aerated bilaterally no focal infiltrate. No acute bony abnormality is seen. Skin fold is noted over the left chest. IMPRESSION: No acute abnormality noted. Electronically Signed   By: Inez Catalina M.D.   On: 05/14/2021 15:18   DG Hip Unilat W or Wo Pelvis 2-3 Views Right  Result Date: 05/14/2021 CLINICAL DATA:  Fall EXAM: DG HIP (WITH OR WITHOUT PELVIS) 2-3V RIGHT COMPARISON:  Pelvis radiographs 01/24/2021, 04/08/2021 FINDINGS: There is a fracture plane through the anterior cortex of the proximal femur seen on the cross-table lateral projection. This probably corresponds to a defect in the cortex along the greater trochanter on the frontal projection. Femoroacetabular alignment is maintained. Intramedullary rod and screw fixation of the left femur is partially imaged. The SI joints and symphysis  pubis are intact. Vascular stents and post kyphoplasty changes in the lower lumbar spine are noted. IMPRESSION: Nondisplaced fracture of the proximal right femur as above. Electronically Signed   By: Valetta Mole M.D.   On: 05/14/2021 15:19    ____________________________________________   PROCEDURES  Procedure(s) performed (including Critical Care):  Procedures  NS 1000 ml bolus IVP  ____________________________________________   INITIAL IMPRESSION / ASSESSMENT AND PLAN / ED COURSE  As part of my medical decision making, I reviewed the following data within the [place] History obtained from family, Radiograph reviewed as noted, Discussed with admitting physician Sharlet Salina, M (ortho), A consult was requested and obtained from this/these consultant(s) Orthopedics, and Notes from prior ED visits   Geriatric patient with the above history, presents to the ED for evaluation of right hip pain s/p mechanical fall. He has a confirmed non-displaced hip fracture.     ----------------------------------------- 5:03 PM on 05/14/2021 ----------------------------------------- Spoke with Dr. Lockie Mola (Ortho), he is requesting a CT to further evaluate the hip fracture.  He will reassess following CT results for surgical plan.  ----------------------------------------- 5:13  PM on 05/14/2021 ----------------------------------------- Spoke with Dr. Rupert Stacks (hospitalist).  She will admit the patient for surgical intervention with Ortho consult as discussed.  Patient's adult daughter [name] is present at bedside, and is understandable and agreeable to the plan of admission.  ____________________________________________   FINAL CLINICAL IMPRESSION(S) / ED DIAGNOSES  Final diagnoses:  Right hip pain  Closed fracture of right hip, initial encounter [facility])     ED Discharge Orders     None        Note:  This document was prepared using Dragon voice recognition  software and may include unintentional dictation errors.    Melvenia Needles, PA-C 05/14/21 1714    Merlyn Lot, MD 05/14/21 [phone]

## 2021-05-15 ENCOUNTER — Inpatient Hospital Stay: Payer: Medicare Other

## 2021-05-15 ENCOUNTER — Inpatient Hospital Stay: Payer: Medicare Other | Admitting: Anesthesiology

## 2021-05-15 ENCOUNTER — Encounter
Admission: EM | Disposition: A | Discharge status: Home Health | Payer: Self-pay | Source: Skilled Nursing Facility | Attending: Obstetrics and Gynecology

## 2021-05-15 ENCOUNTER — Encounter: Payer: Self-pay | Admitting: Obstetrics and Gynecology

## 2021-05-15 DIAGNOSIS — K219 Gastro-esophageal reflux disease without esophagitis: Secondary | ICD-10-CM | POA: Diagnosis present

## 2021-05-15 DIAGNOSIS — G4700 Insomnia, unspecified: Secondary | ICD-10-CM | POA: Diagnosis present

## 2021-05-15 DIAGNOSIS — M25551 Pain in right hip: Secondary | ICD-10-CM | POA: Diagnosis present

## 2021-05-15 DIAGNOSIS — Z20822 Contact with and (suspected) exposure to covid-19: Secondary | ICD-10-CM | POA: Diagnosis present

## 2021-05-15 DIAGNOSIS — E538 Deficiency of other specified B group vitamins: Secondary | ICD-10-CM | POA: Diagnosis present

## 2021-05-15 DIAGNOSIS — Z66 Do not resuscitate: Secondary | ICD-10-CM | POA: Diagnosis present

## 2021-05-15 DIAGNOSIS — D62 Acute posthemorrhagic anemia: Secondary | ICD-10-CM | POA: Diagnosis present

## 2021-05-15 DIAGNOSIS — G3183 Dementia with Lewy bodies: Secondary | ICD-10-CM | POA: Diagnosis present

## 2021-05-15 DIAGNOSIS — I480 Paroxysmal atrial fibrillation: Secondary | ICD-10-CM | POA: Diagnosis present

## 2021-05-15 DIAGNOSIS — E039 Hypothyroidism, unspecified: Secondary | ICD-10-CM | POA: Diagnosis present

## 2021-05-15 DIAGNOSIS — D696 Thrombocytopenia, unspecified: Secondary | ICD-10-CM | POA: Diagnosis present

## 2021-05-15 DIAGNOSIS — H9190 Unspecified hearing loss, unspecified ear: Secondary | ICD-10-CM | POA: Diagnosis present

## 2021-05-15 DIAGNOSIS — I10 Essential (primary) hypertension: Secondary | ICD-10-CM | POA: Diagnosis present

## 2021-05-15 DIAGNOSIS — S7291XA Unspecified fracture of right femur, initial encounter for closed fracture: Secondary | ICD-10-CM | POA: Diagnosis not present

## 2021-05-15 DIAGNOSIS — G4733 Obstructive sleep apnea (adult) (pediatric): Secondary | ICD-10-CM | POA: Diagnosis present

## 2021-05-15 DIAGNOSIS — S72009A Fracture of unspecified part of neck of unspecified femur, initial encounter for closed fracture: Secondary | ICD-10-CM | POA: Diagnosis not present

## 2021-05-15 DIAGNOSIS — W1830XA Fall on same level, unspecified, initial encounter: Secondary | ICD-10-CM | POA: Diagnosis present

## 2021-05-15 DIAGNOSIS — F418 Other specified anxiety disorders: Secondary | ICD-10-CM | POA: Diagnosis present

## 2021-05-15 DIAGNOSIS — M810 Age-related osteoporosis without current pathological fracture: Secondary | ICD-10-CM | POA: Diagnosis present

## 2021-05-15 DIAGNOSIS — R296 Repeated falls: Secondary | ICD-10-CM | POA: Diagnosis present

## 2021-05-15 DIAGNOSIS — Z681 Body mass index (BMI) 19 or less, adult: Secondary | ICD-10-CM | POA: Diagnosis not present

## 2021-05-15 DIAGNOSIS — S72144A Nondisplaced intertrochanteric fracture of right femur, initial encounter for closed fracture: Secondary | ICD-10-CM | POA: Diagnosis present

## 2021-05-15 DIAGNOSIS — F419 Anxiety disorder, unspecified: Secondary | ICD-10-CM | POA: Diagnosis present

## 2021-05-15 DIAGNOSIS — F0280 Dementia in other diseases classified elsewhere without behavioral disturbance: Secondary | ICD-10-CM | POA: Diagnosis present

## 2021-05-15 DIAGNOSIS — I2510 Atherosclerotic heart disease of native coronary artery without angina pectoris: Secondary | ICD-10-CM | POA: Diagnosis present

## 2021-05-15 DIAGNOSIS — I739 Peripheral vascular disease, unspecified: Secondary | ICD-10-CM | POA: Diagnosis present

## 2021-05-15 DIAGNOSIS — E43 Unspecified severe protein-calorie malnutrition: Secondary | ICD-10-CM | POA: Diagnosis present

## 2021-05-15 DIAGNOSIS — J432 Centrilobular emphysema: Secondary | ICD-10-CM | POA: Diagnosis present

## 2021-05-15 HISTORY — PX: INTRAMEDULLARY (IM) NAIL INTERTROCHANTERIC: SHX5875

## 2021-05-15 LAB — BASIC METABOLIC PANEL
Anion gap: 4 — ABNORMAL LOW (ref 5–15)
BUN: 19 mg/dL (ref 8–23)
CO2: 24 mmol/L (ref 22–32)
Calcium: 8.4 mg/dL — ABNORMAL LOW (ref 8.9–10.3)
Chloride: 111 mmol/L (ref 98–111)
Creatinine, Ser: 0.77 mg/dL (ref 0.61–1.24)
GFR, Estimated: >60 mL/min (ref >60)
Glucose, Bld: 83 mg/dL (ref 70–99)
Potassium: 3.8 mmol/L (ref 3.5–5.1)
Sodium: 139 mmol/L (ref 135–145)

## 2021-05-15 LAB — TYPE AND SCREEN
ABO/RH(D): O POS
Antibody Screen: NEGATIVE

## 2021-05-15 LAB — CBC
HCT: 29.6 % — ABNORMAL LOW (ref 39.0–52.0)
Hemoglobin: 9.8 g/dL — ABNORMAL LOW (ref 13.0–17.0)
MCH: 32.2 pg (ref 26.0–34.0)
MCHC: 33.1 g/dL (ref 30.0–36.0)
MCV: 97.4 fL (ref 80.0–100.0)
Platelets: 85 10*3/uL — ABNORMAL LOW (ref 150–400)
RBC: 3.04 MIL/uL — ABNORMAL LOW (ref 4.22–5.81)
RDW: 16.3 % — ABNORMAL HIGH (ref 11.5–15.5)
WBC: 7 10*3/uL (ref 4.0–10.5)
nRBC: 0 % (ref 0.0–0.2)

## 2021-05-15 LAB — SURGICAL PCR SCREEN
MRSA, PCR: POSITIVE — AB
Staphylococcus aureus: POSITIVE — AB

## 2021-05-15 LAB — SARS CORONAVIRUS 2 (TAT 6-24 HRS): SARS Coronavirus 2: NEGATIVE

## 2021-05-15 LAB — SAVE SMEAR(SSMR), FOR PROVIDER SLIDE REVIEW

## 2021-05-15 LAB — TSH: TSH: 1.66 u[IU]/mL (ref 0.350–4.500)

## 2021-05-15 LAB — PATHOLOGIST SMEAR REVIEW

## 2021-05-15 SURGERY — FIXATION, FRACTURE, INTERTROCHANTERIC, WITH INTRAMEDULLARY ROD
Anesthesia: General | Site: Hip | Laterality: Right

## 2021-05-15 MED ORDER — LIDOCAINE HCL (PF) 1 % IJ SOLN
INTRAMUSCULAR | Status: AC
  Filled 2021-05-15: qty 30

## 2021-05-15 MED ORDER — METOPROLOL TARTRATE 25 MG PO TABS
12.5000 mg | ORAL_TABLET | Freq: Two times a day (BID) | ORAL | Status: DC
  Administered 2021-05-15 – 2021-05-17 (×5): 12.5 mg via ORAL
  Filled 2021-05-15 (×5): qty 1

## 2021-05-15 MED ORDER — PHENYLEPHRINE HCL (PRESSORS) 10 MG/ML IV SOLN
INTRAVENOUS | Status: DC | PRN
  Administered 2021-05-15 (×3): 100 ug via INTRAVENOUS

## 2021-05-15 MED ORDER — SUGAMMADEX SODIUM 200 MG/2ML IV SOLN
INTRAVENOUS | Status: DC | PRN
  Administered 2021-05-15: 120 mg via INTRAVENOUS

## 2021-05-15 MED ORDER — CEFAZOLIN SODIUM-DEXTROSE 1-4 GM/50ML-% IV SOLN: INTRAVENOUS | Status: DC | PRN

## 2021-05-15 MED ORDER — BUPIVACAINE-EPINEPHRINE (PF) 0.5% -1:200000 IJ SOLN
INTRAMUSCULAR | Status: AC
  Filled 2021-05-15: qty 30

## 2021-05-15 MED ORDER — CEFAZOLIN (ANCEF) 1 G IV SOLR: 1.0000 g | INTRAVENOUS | Status: DC

## 2021-05-15 MED ORDER — CEFAZOLIN SODIUM-DEXTROSE 1-4 GM/50ML-% IV SOLN: 1.0000 g | INTRAVENOUS | Status: DC

## 2021-05-15 MED ORDER — GENTAMICIN SULFATE 40 MG/ML IJ SOLN
INTRAMUSCULAR | Status: AC
  Filled 2021-05-15: qty 2

## 2021-05-15 MED ORDER — FENTANYL CITRATE (PF) 100 MCG/2ML IJ SOLN
INTRAMUSCULAR | Status: AC
  Filled 2021-05-15: qty 2

## 2021-05-15 MED ORDER — SODIUM CHLORIDE 0.9 % IV SOLN
INTRAVENOUS | Status: DC | PRN
  Administered 2021-05-15: 100 ug/min via INTRAVENOUS

## 2021-05-15 MED ORDER — ROCURONIUM BROMIDE 10 MG/ML (PF) SYRINGE
PREFILLED_SYRINGE | INTRAVENOUS | Status: AC
  Filled 2021-05-15: qty 10

## 2021-05-15 MED ORDER — MUPIROCIN 2 % EX OINT
1.0000 "application " | TOPICAL_OINTMENT | Freq: Two times a day (BID) | CUTANEOUS | Status: DC
  Administered 2021-05-15 – 2021-05-17 (×4): 1 via NASAL
  Filled 2021-05-15: qty 22

## 2021-05-15 MED ORDER — 0.9 % SODIUM CHLORIDE (POUR BTL) OPTIME
TOPICAL | Status: DC | PRN
  Administered 2021-05-15: 1000 mL

## 2021-05-15 MED ORDER — CEFAZOLIN SODIUM-DEXTROSE 1-4 GM/50ML-% IV SOLN
1.0000 g | Freq: Three times a day (TID) | INTRAVENOUS | Status: DC
  Filled 2021-05-15 (×3): qty 50

## 2021-05-15 MED ORDER — SUCCINYLCHOLINE CHLORIDE 200 MG/10ML IV SOSY
PREFILLED_SYRINGE | INTRAVENOUS | Status: DC | PRN
  Administered 2021-05-15: 80 mg via INTRAVENOUS

## 2021-05-15 MED ORDER — FENTANYL CITRATE (PF) 100 MCG/2ML IJ SOLN
INTRAMUSCULAR | Status: DC | PRN
  Administered 2021-05-15: 25 ug via INTRAVENOUS

## 2021-05-15 MED ORDER — FENTANYL CITRATE (PF) 100 MCG/2ML IJ SOLN: 25.0000 ug | INTRAMUSCULAR | Status: DC | PRN

## 2021-05-15 MED ORDER — LACTATED RINGERS IV SOLN: INTRAVENOUS | Status: DC | PRN

## 2021-05-15 MED ORDER — EPHEDRINE SULFATE 50 MG/ML IJ SOLN
INTRAMUSCULAR | Status: DC | PRN
  Administered 2021-05-15: 10 mg via INTRAVENOUS

## 2021-05-15 MED ORDER — PROPOFOL 10 MG/ML IV BOLUS
INTRAVENOUS | Status: DC | PRN
Start: 2021-05-15 — End: 2021-05-15
  Administered 2021-05-15: 80 mg via INTRAVENOUS

## 2021-05-15 MED ORDER — CEFAZOLIN SODIUM-DEXTROSE 2-4 GM/100ML-% IV SOLN
2.0000 g | Freq: Two times a day (BID) | INTRAVENOUS | Status: AC
  Administered 2021-05-16 (×2): 2 g via INTRAVENOUS
  Filled 2021-05-15 (×2): qty 100

## 2021-05-15 MED ORDER — SUCCINYLCHOLINE CHLORIDE 200 MG/10ML IV SOSY
PREFILLED_SYRINGE | INTRAVENOUS | Status: AC
  Filled 2021-05-15: qty 10

## 2021-05-15 MED ORDER — DEXMEDETOMIDINE HCL IN NACL 200 MCG/50ML IV SOLN
INTRAVENOUS | Status: AC
  Filled 2021-05-15: qty 50

## 2021-05-15 MED ORDER — CEFAZOLIN SODIUM-DEXTROSE 1-4 GM/50ML-% IV SOLN
INTRAVENOUS | Status: DC | PRN
  Administered 2021-05-15: 1 g via INTRAVENOUS

## 2021-05-15 MED ORDER — SODIUM CHLORIDE (PF) 0.9 % IJ SOLN
INTRAMUSCULAR | Status: AC
  Filled 2021-05-15: qty 10

## 2021-05-15 MED ORDER — PROPOFOL 500 MG/50ML IV EMUL
INTRAVENOUS | Status: DC | PRN
  Administered 2021-05-15: 100 ug/kg/min via INTRAVENOUS

## 2021-05-15 MED ORDER — SODIUM CHLORIDE 0.9 % IR SOLN: Status: DC | PRN

## 2021-05-15 MED ORDER — PHENYLEPHRINE HCL (PRESSORS) 10 MG/ML IV SOLN
INTRAVENOUS | Status: AC
  Filled 2021-05-15: qty 1

## 2021-05-15 MED ORDER — CHLORHEXIDINE GLUCONATE CLOTH 2 % EX PADS
6.0000 | MEDICATED_PAD | Freq: Every day | CUTANEOUS | Status: DC
  Administered 2021-05-16: 6 via TOPICAL

## 2021-05-15 MED ORDER — ROCURONIUM BROMIDE 100 MG/10ML IV SOLN
INTRAVENOUS | Status: DC | PRN
  Administered 2021-05-15: 30 mg via INTRAVENOUS

## 2021-05-15 MED ORDER — CEFAZOLIN SODIUM 1 G IJ SOLR
INTRAMUSCULAR | Status: AC
  Filled 2021-05-15: qty 10

## 2021-05-15 MED ORDER — DEXMEDETOMIDINE HCL IN NACL 200 MCG/50ML IV SOLN
INTRAVENOUS | Status: DC | PRN
  Administered 2021-05-15: 8 ug via INTRAVENOUS

## 2021-05-15 MED ORDER — BUPIVACAINE HCL (PF) 0.5 % IJ SOLN
INTRAMUSCULAR | Status: AC
  Filled 2021-05-15: qty 30

## 2021-05-15 MED ORDER — BUPIVACAINE-EPINEPHRINE 0.5% -1:200000 IJ SOLN
INTRAMUSCULAR | Status: DC | PRN
  Administered 2021-05-15: 30 mL

## 2021-05-15 SURGICAL SUPPLY — 46 items
APL PRP STRL LF DISP 70% ISPRP (MISCELLANEOUS) ×1
BIT DRILL LONG 4.0 (BIT) IMPLANT
BNDG COHESIVE 6X5 TAN ST LF (GAUZE/BANDAGES/DRESSINGS) ×6 IMPLANT
CHLORAPREP W/TINT 26 (MISCELLANEOUS) ×2 IMPLANT
DRAPE 3/4 80X56 (DRAPES) ×4 IMPLANT
DRAPE C-ARM 42X72 X-RAY (DRAPES) ×2 IMPLANT
DRAPE SURG 17X11 SM STRL (DRAPES) ×4 IMPLANT
DRAPE U-SHAPE 47X51 STRL (DRAPES) ×2 IMPLANT
DRILL BIT LONG 4.0 (BIT) ×2
DRSG OPSITE POSTOP 3X4 (GAUZE/BANDAGES/DRESSINGS) ×3 IMPLANT
DRSG OPSITE POSTOP 4X8 (GAUZE/BANDAGES/DRESSINGS) ×1 IMPLANT
ELECT REM PT RETURN 9FT ADLT (ELECTROSURGICAL) ×2
ELECTRODE REM PT RTRN 9FT ADLT (ELECTROSURGICAL) ×1 IMPLANT
GAUZE 4X4 16PLY ~~LOC~~+RFID DBL (SPONGE) ×2 IMPLANT
GAUZE SPONGE 4X4 12PLY STRL (GAUZE/BANDAGES/DRESSINGS) ×2 IMPLANT
GAUZE XEROFORM 1X8 LF (GAUZE/BANDAGES/DRESSINGS) ×2 IMPLANT
GLOVE SURG ENC MOIS LTX SZ7.5 (GLOVE) ×2 IMPLANT
GLOVE SURG UNDER POLY LF SZ7.5 (GLOVE) ×2 IMPLANT
GOWN L4 XLG 20 PK N/S (GOWN DISPOSABLE) ×2 IMPLANT
GOWN STRL REUS W/ TWL LRG LVL3 (GOWN DISPOSABLE) ×1 IMPLANT
GOWN STRL REUS W/TWL LRG LVL3 (GOWN DISPOSABLE) ×2
GUIDE PIN 3.2X343 (PIN) ×2
GUIDE PIN 3.2X343MM (PIN) ×4
KIT TURNOVER CYSTO (KITS) ×2 IMPLANT
MANIFOLD NEPTUNE II (INSTRUMENTS) ×2 IMPLANT
MAT ABSORB  FLUID 56X50 GRAY (MISCELLANEOUS) ×2
MAT ABSORB FLUID 56X50 GRAY (MISCELLANEOUS) ×1 IMPLANT
NAIL INTERTAN 10X18 130D 10S (Nail) ×1 IMPLANT
NS IRRIG 1000ML POUR BTL (IV SOLUTION) ×2 IMPLANT
PACK HIP COMPR (MISCELLANEOUS) ×2 IMPLANT
PAD ARMBOARD 7.5X6 YLW CONV (MISCELLANEOUS) ×2 IMPLANT
PIN GUIDE 3.2X343MM (PIN) IMPLANT
SCREW LAG COMPR KIT 95/90 (Screw) ×1 IMPLANT
SCREW TRIGEN LOW PROF 5.0X32.5 (Screw) ×1 IMPLANT
SPONGE T-LAP 18X18 ~~LOC~~+RFID (SPONGE) ×4 IMPLANT
STAPLER SKIN PROX 35W (STAPLE) ×2 IMPLANT
SUCTION FRAZIER HANDLE 10FR (MISCELLANEOUS) ×2
SUCTION TUBE FRAZIER 10FR DISP (MISCELLANEOUS) ×1 IMPLANT
SUT VIC AB 0 CT1 36 (SUTURE) ×4 IMPLANT
SUT VIC AB 2-0 CT1 (SUTURE) ×2 IMPLANT
SUT VIC AB 2-0 CT1 27 (SUTURE) ×2
SUT VIC AB 2-0 CT1 TAPERPNT 27 (SUTURE) ×1 IMPLANT
SUT VICRYL 0 AB UR-6 (SUTURE) ×1 IMPLANT
SYR 30ML LL (SYRINGE) ×2 IMPLANT
TAPE PAPER 1/2X10 TAN MEDIPORE (MISCELLANEOUS) ×2 IMPLANT
WATER STERILE IRR 500ML POUR (IV SOLUTION) ×2 IMPLANT

## 2021-05-15 NOTE — Progress Notes (Addendum)
PROGRESS NOTE    [name]  [id number] DOB: 10-Jan-1934 DOA: 05/14/2021 PCP: Monico Blitz, NP  Outpatient Specialists: cardiology, urology, pulmonology, vascular surgery    Brief Narrative:   Hx multiple chronic conditions, history left hip fracture repaired earlier this year, who presented on 9/13 after witnessed fall at home 2 days prior. Imaging shows right nondisplaced intertrochanteric fracture. Recent admit for acute cystitis.   Assessment & Plan:   Active Problems:   Essential hypertension   B12 deficiency   Carotid artery occlusion without infarction   AAA (abdominal aortic aneurysm) without rupture (HCC)   CAD (coronary artery disease)   Femur fracture, right ([place])   # Right intertrochantic femur fracture # Gluteal hematoma Pain well controlled. Low-risk stress test in 2020. Ortho following, plan for operative repair today - continue NPO - toradol, morphine for pain control, convert to oral after surgery - PT/OT consults ordered for tomorrow  # Thrombocytopenia Plts 85, this appears to be new - check hiv, hcv, smear - trend  # History hypothyroidism? Not on meds. Normal tsh 2021 - repeat tsh  # Dementia Calm - seroquel qhs, mirtazapine  # CAD # History CABG - cont home zetia and crestor when taking PO  # Paroxysmal a-fib Eliquis held recent admission 2/2 hematuria. Metoprolol held 2/2 hypotension. Here bp mildly elevated, RR. - will add back metoprolol 12.5 bid - hold eliquis for now  # PAD # AAA s/p repair Lower extremities warm - continue zetia and crestor when taking po  # LUTS # History prostate cancer - cont proscar and myrbetriq when taking po  # OSA - cpap qhs  DVT prophylaxis: SCDs Code Status: DNR Family Communication: son updated telphonically 9/14  Level of care: Med-Surg Status is: Observation  The patient will require care spanning > 2 midnights and should be moved to inpatient because: Inpatient level of care  appropriate due to severity of illness  Dispo: The patient is from: SNF              Anticipated d/c is to: SNF              Patient currently is not medically stable to d/c.   Difficult to place patient No   Consultants:  orthopedics  Procedures: Plan for ORIF today  Antimicrobials:  Cefazolin pre-operative    Subjective: This morning reclining in bed. Denies pain. Appears confused  Objective: Vitals:   05/14/21 1930 05/14/21 2054 05/15/21 0430 05/15/21 0736  BP: 140/75 (!) 161/109 (!) 156/98 (!) 149/82  Pulse: 94 (!) 103 89 82  Resp: '16 19 18 18  '$ Temp:  98.8 F (37.1 C) 97.7 F (36.5 C) 97.7 F (36.5 C)  TempSrc:      SpO2: 98% 99% 95% 98%  Weight:      Height:        Intake/Output Summary (Last 24 hours) at 05/15/2021 0803 Last data filed at 05/14/2021 1909 Gross per 24 hour  Intake 1000 ml  Output --  Net 1000 ml   Filed Weights   05/14/21 1418  Weight: 58.3 kg    Examination:  General exam: Appears calm and comfortable  Respiratory system: Clear to auscultation. Respiratory effort normal. Cardiovascular system: S1 & S2 heard, RRR. Soft systolic murmur Gastrointestinal system: Abdomen is nondistended, soft and nontender. No organomegaly or masses felt. Normal bowel sounds heard. Central nervous system: Alert, not oriented. Able to move all extremities Extremities: warm Skin: echymoses lower extremities Psychiatry: calm    Data  Reviewed: I have personally reviewed following labs and imaging studies  CBC: Recent Labs  Lab 05/14/21 1429 05/15/21 0527  WBC 8.0 7.0  NEUTROABS 4.6  --   HGB 11.6* 9.8*  HCT 34.6* 29.6*  MCV 96.6 97.4  PLT 99* 85*   Basic Metabolic Panel: Recent Labs  Lab 05/14/21 1429 05/15/21 0527  NA 141 139  K 4.1 3.8  CL 108 111  CO2 27 24  GLUCOSE 98 83  BUN 19 19  CREATININE 0.79 0.77  CALCIUM 8.8* 8.4*   GFR: Estimated Creatinine Clearance: 55.7 mL/min (by C-G formula based on SCr of 0.77 mg/dL). Liver  Function Tests: No results for input(s): AST, ALT, ALKPHOS, BILITOT, PROT, ALBUMIN in the last 168 hours. No results for input(s): LIPASE, AMYLASE in the last 168 hours. No results for input(s): AMMONIA in the last 168 hours. Coagulation Profile: No results for input(s): INR, PROTIME in the last 168 hours. Cardiac Enzymes: No results for input(s): CKTOTAL, CKMB, CKMBINDEX, TROPONINI in the last 168 hours. BNP (last 3 results) No results for input(s): PROBNP in the last 8760 hours. HbA1C: No results for input(s): HGBA1C in the last 72 hours. CBG: No results for input(s): GLUCAP in the last 168 hours. Lipid Profile: No results for input(s): CHOL, HDL, LDLCALC, TRIG, CHOLHDL, LDLDIRECT in the last 72 hours. Thyroid Function Tests: No results for input(s): TSH, T4TOTAL, FREET4, T3FREE, THYROIDAB in the last 72 hours. Anemia Panel: No results for input(s): VITAMINB12, FOLATE, FERRITIN, TIBC, IRON, RETICCTPCT in the last 72 hours. Urine analysis:    Component Value Date/Time   COLORURINE YELLOW (A) 05/14/2021 2119   APPEARANCEUR CLEAR (A) 05/14/2021 2119   APPEARANCEUR Cloudy (A) 01/25/2020 1009   LABSPEC 1.018 05/14/2021 2119   LABSPEC 1.011 10/01/2012 0420   PHURINE 6.0 05/14/2021 2119   GLUCOSEU NEGATIVE 05/14/2021 2119   GLUCOSEU Negative 10/01/2012 0420   HGBUR NEGATIVE 05/14/2021 2119   BILIRUBINUR NEGATIVE 05/14/2021 2119   BILIRUBINUR Negative 11/07/2020 1115   BILIRUBINUR Negative 01/25/2020 1009   BILIRUBINUR Negative 10/01/2012 0420   KETONESUR NEGATIVE 05/14/2021 2119   PROTEINUR NEGATIVE 05/14/2021 2119   UROBILINOGEN 0.2 11/07/2020 1115   NITRITE NEGATIVE 05/14/2021 2119   LEUKOCYTESUR NEGATIVE 05/14/2021 2119   LEUKOCYTESUR Negative 10/01/2012 0420   Sepsis Labs: '@LABRCNTIP'$ (procalcitonin:4,lacticidven:4)  ) Recent Results (from the past 240 hour(s))  SARS CORONAVIRUS 2 (TAT 6-24 HRS) Nasopharyngeal Nasopharyngeal Swab     Status: None   Collection Time:  05/14/21  3:28 PM   Specimen: Nasopharyngeal Swab  Result Value Ref Range Status   SARS Coronavirus 2 NEGATIVE NEGATIVE Final    Comment: (NOTE) SARS-CoV-2 target nucleic acids are NOT DETECTED.  The SARS-CoV-2 RNA is generally detectable in upper and lower respiratory specimens during the acute phase of infection. Negative results do not preclude SARS-CoV-2 infection, do not rule out co-infections with other pathogens, and should not be used as the sole basis for treatment or other patient management decisions. Negative results must be combined with clinical observations, patient history, and epidemiological information. The expected result is Negative.  Fact Sheet for Patients: SugarRoll.be  Fact Sheet for Healthcare Providers: https://www.woods-mathews.com/  This test is not yet approved or cleared by the [place] FDA and  has been authorized for detection and/or diagnosis of SARS-CoV-2 by FDA under an Emergency Use Authorization (EUA). This EUA will remain  in effect (meaning this test can be used) for the duration of the COVID-19 declaration under Se ction 564(b)(1) of the Act, 21  U.S.C. section 360bbb-3(b)(1), unless the authorization is terminated or revoked sooner.  Performed at [facility], [place] [address] [place], [place] [address]          Radiology Studies: CT Hip Right Wo Contrast  Result Date: 05/14/2021 CLINICAL DATA:  [place].  Abnormal right hip radiographs. EXAM: CT OF THE RIGHT HIP WITHOUT CONTRAST TECHNIQUE: Multidetector CT imaging of the right hip was performed according to the standard protocol. Multiplanar CT image reconstructions were also generated. COMPARISON:  Radiographs, same date. FINDINGS: As demonstrated on the radiographs there is a cortical fracture involving the intertrochanteric region of the right hip anteriorly. This could be an acute fracture or possibly a stress fracture. I do not see  any definite findings for a through and through fracture. There is a healing fracture involving the inferior pubic ramus on the right side. The right hip is normally located. There are moderate degenerative changes and chondrocalcinosis. The acetabulum is intact. There is a "mass" involving the lateral aspect of the gluteus maximus muscle adjacent to the trochanteric region of the femur. This is likely a partially liquified hematoma related to trauma. Vascular calcifications are noted with a right iliac artery stent. No significant intrapelvic abnormalities are identified. - IMPRESSION: 1. Nondisplaced anterior cortical fracture involving the intertrochanteric region of the right hip. This could be an acute fracture or possibly a stress fracture. 2. Healing fracture involving the inferior pubic ramus on the right side. 3. Probable liquified hematoma involving lateral aspect of the right gluteus maximus muscle. 4. Moderate degenerative changes and chondrocalcinosis. Electronically Signed   By: Marijo Sanes M.D.   On: 05/14/2021 17:14   MR HIP RIGHT WO CONTRAST  Result Date: 05/15/2021 CLINICAL DATA:  Right hip fracture. Evaluate for extent of the intertrochanteric fracture. EXAM: MR OF THE RIGHT HIP WITHOUT CONTRAST TECHNIQUE: Multiplanar, multisequence MR imaging was performed. No intravenous contrast was administered. COMPARISON:  None. FINDINGS: Bones: 1. No periosteal reaction or bone destruction. No aggressive osseous lesion. Marrow edema in the sacral ala bilaterally consistent with sacral insufficiency fractures. Degenerative disease with disc height loss of the lower lumbar spine. Articular cartilage and labrum Articular cartilage: Partial-thickness cartilage loss of the femoral head and acetabulum bilaterally. Labrum: Grossly intact, but evaluation is limited by lack of intraarticular fluid. Joint or bursal effusion Joint effusion:  No hip joint effusion.  No SI joint effusion. Bursae:  No bursa  formation. Muscles and tendons Flexors: Normal. Extensors: Normal. Abductors: Normal. Adductors: Normal. Gluteals: Severe muscle edema in the gluteal musculature most consistent with muscle strain. 6.3 cm heterogeneous fluid collection in the right gluteus maximus muscle most consistent with an intramuscular hematoma adjacent to the greater trochanter. Hamstrings: Normal. Other findings No pelvic free fluid. No fluid collection or hematoma. No inguinal lymphadenopathy. No inguinal hernia. IMPRESSION: 1. Nondisplaced right intertrochanteric fracture extending from the greater trochanter to the lesser trochanter without angulation. 2. Bilateral nondisplaced sacral insufficiency fractures with associated marrow edema. 3. Healed right superior and inferior pubic rami fractures with minimal residual marrow edema. 4. Left intertrochanteric fracture transfixed with a intramedullary nail and interlocking screw without failure or complication. Mild residual marrow edema within the intertrochanteric region of the left hip. 5. Severe muscle strain of the gluteal musculature with a 6.3 cm intramuscular hematoma in the right gluteus maximus muscle. 6. Partial-thickness cartilage loss of bilateral hips. Electronically Signed   By: Kathreen Devoid M.D.   On: 05/15/2021 06:45   DG Chest Portable 1 View  Result  Date: 05/14/2021 CLINICAL DATA:  Recent fall with history of hip fracture EXAM: PORTABLE CHEST 1 VIEW COMPARISON:  02/02/2021 FINDINGS: Cardiac shadow is within normal limits. Postsurgical changes are noted. The lungs are well aerated bilaterally no focal infiltrate. No acute bony abnormality is seen. Skin fold is noted over the left chest. IMPRESSION: No acute abnormality noted. Electronically Signed   By: Inez Catalina M.D.   On: 05/14/2021 15:18   DG Hip Unilat W or Wo Pelvis 2-3 Views Right  Result Date: 05/14/2021 CLINICAL DATA:  Fall EXAM: DG HIP (WITH OR WITHOUT PELVIS) 2-3V RIGHT COMPARISON:  Pelvis radiographs  01/24/2021, 04/08/2021 FINDINGS: There is a fracture plane through the anterior cortex of the proximal femur seen on the cross-table lateral projection. This probably corresponds to a defect in the cortex along the greater trochanter on the frontal projection. Femoroacetabular alignment is maintained. Intramedullary rod and screw fixation of the left femur is partially imaged. The SI joints and symphysis pubis are intact. Vascular stents and post kyphoplasty changes in the lower lumbar spine are noted. IMPRESSION: Nondisplaced fracture of the proximal right femur as above. Electronically Signed   By: Valetta Mole M.D.   On: 05/14/2021 15:19        Scheduled Meds:  cholecalciferol  2,000 Units Oral Daily   donepezil  10 mg Oral QHS   ezetimibe  10 mg Oral Daily   feeding supplement  237 mL Oral TID BM   finasteride  5 mg Oral Daily   mirabegron ER  50 mg Oral Daily   mirtazapine  7.5 mg Oral QHS   QUEtiapine  25 mg Oral QHS   rosuvastatin  20 mg Oral Daily   traZODone  50 mg Oral QHS   Continuous Infusions:   ceFAZolin (ANCEF) IV       LOS: 0 days    Time spent: 35 min    Desma Maxim, MD [facility]   If 7PM-7AM, please contact night-coverage [email or web address] Password [facility] 05/15/2021, 8:03 AM

## 2021-05-15 NOTE — Anesthesia Preprocedure Evaluation (Signed)
Anesthesia Evaluation  Patient identified by MRN, date of birth, ID band Patient awake    Reviewed: Allergy & Precautions, NPO status , Patient's Chart, lab work & pertinent test results, reviewed documented beta blocker date and time   History of Anesthesia Complications Negative for: history of anesthetic complications  Airway Mallampati: III       Dental  (+) Upper Dentures, Lower Dentures, Dental Advidsory Given   Pulmonary neg shortness of breath, sleep apnea, Continuous Positive Airway Pressure Ventilation and Oxygen sleep apnea , COPD,  COPD inhaler, neg recent URI, Patient abstained from smoking., former smoker,           Cardiovascular hypertension, Pt. on medications and Pt. on home beta blockers (-) angina+ CAD, + CABG and + Peripheral Vascular Disease  + dysrhythmias Atrial Fibrillation (-) Valvular Problems/Murmurs     Neuro/Psych neg Seizures PSYCHIATRIC DISORDERS Anxiety Depression Dementia Lewy Body Dementia    GI/Hepatic Neg liver ROS, hiatal hernia, GERD  Medicated and Controlled,  Endo/Other  neg diabetesHypothyroidism   Renal/GU Renal disease (stones)     Musculoskeletal  (+) Arthritis , Osteoarthritis,    Abdominal   Peds  Hematology  (+) Blood dyscrasia, anemia ,   Anesthesia Other Findings Past Medical History: 05/2019: AAA (abdominal aortic aneurysm) (HCC)     Comment:  Relativley Rapid progression from June-Aug 2020 (4.6 -               5.4 cm): s/p EVAR (Dr. [facility]): EVAR - 23 mm prox, 12              cm distal & x 18 cm length Gore Excluder Endoprosthesis               Main body (via RFA distal to lowest Renal A); 23m x 14               cm L Contralateral Limb for L Iliac - extended with 8 mm               x 6cm LifeStar stent (post-dilated with 7 mm DEB).                Additional R Iliac PTA not required.  No date: Arthritis 01/03/2013: Basal cell carcinoma of eyelid     Comment:   2019 - R side of Nose No date: Benign neoplasm of ascending colon No date: Benign neoplasm of descending colon 2007: Bilateral iliac artery stenosis (HCC)     Comment:  Bilateral Iliac A stenting; extension of EVAR limbs into              both Iliacs -- additional stent placed in L Iliac. No date: Cancer ([facility]     Comment:  Skin cancer No date: Colon polyps No date: Complication of anesthesia     Comment:  severe confusion agitation requiring hospital admission               x 4 days 04/2019: Compression fracture of fourth lumbar vertebra (HCC)     Comment:  - s/p Kyphoplasty; T12, L3-5 (*[facility]) No date: COPD (chronic obstructive pulmonary disease) [place]     Comment:  Centrilobular Emphysema 1998: Coronary artery disease involving native coronary artery  without angina pectoris     Comment:  - s/p CABG, occluded SVG-D1; patent LIMA-LAD, SVG-OM,               SVG- RCA No date: [place]     Comment:  memory loss - started noting late 2019 No date: Falls frequently     Comment:  broke foot 2021: Fracture     Comment:  left foot No date: GERD (gastroesophageal reflux disease) 02/10/2015: Hemorrhoid No date: History of hiatal hernia No date: History of kidney stones No date: HOH (hard of hearing) No date: Hypertension No date: Macular degeneration disease No date: Osteoporosis No date: PAF (paroxysmal atrial fibrillation) (HCC)     Comment:  on Eliquis for OAC - Rate Control  No date: Prostate cancer ([place]) No date: Prostate disorder No date: Sciatic pain     Comment:  Chronic No date: Sleep apnea     Comment:  cpap 02/10/2015: Temporary cerebral vascular dysfunction     Comment:  Had negative work-up.  July, 2012.  No medication               changes, had forgot them that day, got dehydrated.  No date: Urinary incontinence   Reproductive/Obstetrics                             Anesthesia Physical  Anesthesia Plan  ASA: III and  emergent  Anesthesia Plan: General   Post-op Pain Management:    Induction: Intravenous  PONV Risk Score and Plan: 1 and Ondansetron, Treatment may vary due to age or medical condition, TIVA and Propofol infusion  Airway Management Planned: Oral ETT  Additional Equipment:   Intra-op Plan:   Post-operative Plan: Extubation in OR  Informed Consent: I have reviewed the patients History and Physical, chart, labs and discussed the procedure including the risks, benefits and alternatives for the proposed anesthesia with the patient or authorized representative who has indicated his/her understanding and acceptance.       Plan Discussed with:   Anesthesia Plan Comments:         Anesthesia Quick Evaluation

## 2021-05-15 NOTE — Progress Notes (Signed)
Initial Nutrition Assessment  DOCUMENTATION CODES:  Severe malnutrition in context of social or environmental circumstances, Underweight  INTERVENTION:  Advanced to DYS 2 diet after surgery - needs foods finely minced due to poor dentition Ensure Enlive po TID, each supplement provides 350 kcal and 20 grams of protein Magic cup TID with meals, each supplement provides 290 kcal and 9 grams of protein Nursing to assist with tray set-up   NUTRITION DIAGNOSIS:  Severe Malnutrition related to social / environmental circumstances (dementia) as evidenced by severe fat depletion, severe muscle depletion, percent weight loss (23.9% x 6 months).  GOAL:  Patient will meet greater than or equal to 90% of their needs  MONITOR:  PO intake, Supplement acceptance, Diet advancement, I & O's  REASON FOR ASSESSMENT:  Consult Hip fracture protocol  ASSESSMENT:  87 y.o. male with history of colon and prostate cancer, COPD, CAD, dementia, vitamin b12 deficiency, HLD, GERD, HTN, hearing loss, and osteoporosis, presented to ED from his facility with hip pain after a fall two days prior. Imaging from facility confirmed a right hip and there is also concern for possible UTI as pt has had AMS recently with increased agitation.  Orthopedics consulted and plans for surgery today (9/14).  Pt resting in bed at the time of visit, woke easily to voice. Daughter at bedside provides hx. Pt is know to RD from previous admission. Appears that he has lost a significant amount of weight since last admission and muscle/fat depletions are more pronounced.   Family reports that pt has had a decline in the last few months since his initial fall. Reports an increase in sundowning and agitation at his facility.   Typically does well at meals though, does require set up assistance and some encouragement. Reports pt does use assistive devices during meals on dining utensils. Also has macular degeneration and benefits from being  told what food he is being served. Will request set-up assistance once diet is advanced. Also reports that pt has dentures but that they do not fit well and pt frequently takes them out. Requests a minced soft diet - recommend DYS 2 after surgery.  Pt does routinely drink ensure at ALF. Daughter reports they will make pt milkshakes that he consumes well. Prefers vanilla flavored supplements per daughter.   Nutritionally Relevant Medications: Scheduled Meds:  cholecalciferol  2,000 Units Oral Daily   ezetimibe  10 mg Oral Daily   feeding supplement  237 mL Oral TID BM   mirtazapine  7.5 mg Oral QHS   rosuvastatin  20 mg Oral Daily   Continuous Infusions:   ceFAZolin (ANCEF) IV     Labs Reviewed  NUTRITION - FOCUSED PHYSICAL EXAM: Flowsheet Row Most Recent Value  Orbital Region Severe depletion  Upper Arm Region Severe depletion  Thoracic and Lumbar Region Severe depletion  Buccal Region Severe depletion  Temple Region Severe depletion  Clavicle Bone Region Severe depletion  Clavicle and Acromion Bone Region Severe depletion  Scapular Bone Region Severe depletion  Dorsal Hand Severe depletion  Patellar Region Severe depletion  Anterior Thigh Region Severe depletion  Posterior Calf Region Severe depletion  Edema (RD Assessment) None  Hair Reviewed  Eyes Reviewed  Mouth Reviewed  [lack of dentention]  Skin Reviewed  Nails Reviewed   Diet Order:   Diet Order             Diet NPO time specified Except for: [employer], Sips with Meds  Diet effective midnight  EDUCATION NEEDS:  No education needs have been identified at this time  Skin:  Skin Assessment: Reviewed RN Assessment  Last BM:  9/13  Height:  Ht Readings from Last 1 Encounters:  05/14/21 6' (1.829 m)   Weight:  Wt Readings from Last 1 Encounters:  05/15/21 58.7 kg   Ideal Body Weight:  80.9 kg  BMI:  Body mass index is 17.55 kg/m.  Estimated Nutritional Needs:  Kcal:  1800-2000  kcal/d Protein:  90-100 g/d Fluid:  1.8-2L/d  Ranell Patrick, RD, LDN Clinical Dietitian Pager on [place]

## 2021-05-15 NOTE — Anesthesia Procedure Notes (Signed)
Procedure Name: Intubation Date/Time: 05/15/2021 4:38 PM Performed by: Jonna Clark, CRNA Pre-anesthesia Checklist: Patient identified, Patient being monitored, Timeout performed, Emergency Drugs available and Suction available Patient Re-evaluated:Patient Re-evaluated prior to induction Oxygen Delivery Method: Circle system utilized Preoxygenation: Pre-oxygenation with 100% oxygen Induction Type: IV induction Ventilation: Mask ventilation without difficulty Laryngoscope Size: 3 and McGraph Grade View: Grade I Tube type: Oral Tube size: 7.0 mm Number of attempts: 1 Airway Equipment and Method: Stylet Placement Confirmation: ETT inserted through vocal cords under direct vision, positive ETCO2 and breath sounds checked- equal and bilateral Secured at: 22 cm Tube secured with: Tape Dental Injury: Teeth and Oropharynx as per pre-operative assessment

## 2021-05-15 NOTE — Consult Note (Signed)
ORTHOPAEDIC CONSULTATION  REQUESTING PHYSICIAN: Wouk, Ailene Rud, MD  Chief Complaint: right hip pain  HPI: [name] is a 87 y.o. male who complains of right hip pain after a fall at his assisted living facility. He was admitted on 9/13. Imaging showed presence of a non-displaced left intertrochanteric hip fracture, which was confirmed on MRI.  Patient underwent left hip IMN 12/2020. PMH notable for dementia, not currently on any anticoagulation.  Past Medical History:  Diagnosis Date   AAA (abdominal aortic aneurysm) ([place]) 05/2019   Relativley Rapid progression from June-Aug 2020 (4.6 - 5.4 cm): s/p EVAR (Dr. Lucky [facility]): EVAR - 23 mm prox, 12 cm distal & x 18 cm length Gore Excluder Endoprosthesis Main body (via RFA distal to lowest Renal A); 10m x 14 cm L Contralateral Limb for L Iliac - extended with 8 mm x 6cm LifeStar stent (post-dilated with 7 mm DEB).  Additional R Iliac PTA not required.    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 ([place] 09/15/2017   09-15-2017 DIAGNOSIS:  A. COLON POLYP X 2, ASCENDING; COLD SNARE:  - TUBULAR ADENOMA.  - NEGATIVE FOR HIGH-GRADE DYSPLASIA AND MALIGNANCY.  - NO ADDITIONAL TISSUE IDENTIFIED; FECAL MATERIAL PRESENT.   B. COLON POLYP X 2, DESCENDING; COLD SNARE:  - ADENOCARCINOMA ARISING IN A DYSPLASTIC SESSILE SERRATED ADENOMA, WITH  LYMPHOVASCULAR INVASION, SEE COMMENT.  - TUBULAR ADENOMA, 2 FRAGMENTS, NEGATIVE FO   Arthritis    Basal cell carcinoma of eyelid 01/03/2013   2019 - R side of Nose   Benign neoplasm of ascending colon    Benign neoplasm of descending colon    Bilateral iliac artery stenosis ([place] 2007   Bilateral Iliac A stenting; extension of EVAR limbs into both Iliacs -- additional stent placed in L Iliac.   Cancer ([facility]    Skin cancer   Colon polyps    Complication of anesthesia    severe confusion agitation requiring hospital admission x 4 days   Compression fracture of fourth lumbar vertebra ([facility] 04/2019   - s/p Kyphoplasty;  T12, L3-5 (*[facility])   COPD (chronic obstructive pulmonary disease) (HCC)    Centrilobular Emphysema   Coronary artery disease involving native coronary artery without angina pectoris 1998   - s/p CABG, occluded SVG-D1; patent LIMA-LAD, SVG-OM, SVG- RCA   Dementia ([facility]    memory loss - started noting late 2019   Falls frequently    broke foot   Fracture 2021   left foot   GERD (gastroesophageal reflux disease)    Hemorrhoid 02/10/2015   History of hiatal hernia    History of kidney stones    HOH (hard of hearing)    Hypertension    Lewy body dementia ([place] [employer] 04/04/2020   Macular degeneration disease    Osteoporosis    PAF (paroxysmal atrial fibrillation) (HCC)    on Eliquis for OAC - Rate Control    Prostate cancer (HCC)    Prostate disorder    Sciatic pain    Chronic   Sleep apnea    cpap   Temporary cerebral vascular dysfunction 02/10/2015   Had negative work-up.  July, 2012.  No medication changes, had forgot them that day, got dehydrated.    Urinary incontinence    Past Surgical History:  Procedure Laterality Date   ABDOMINAL AORTIC ENDOVASCULAR STENT GRAFT Bilateral 05/18/2019   Procedure: ABDOMINAL AORTIC ENDOVASCULAR STENT GRAFT;  Surgeon: DAlgernon Huxley MD;  Location: [facility] ORS;  Service: Vascular;  Laterality: Bilateral;   ANKLE SURGERY  Left    ORIF    Arch aortogram and carotid aortogram  06/02/2006   Dr Oneida Alar did surgery   BASAL CELL CARCINOMA EXCISION  04/2016   Dermatology   CARDIAC CATHETERIZATION  01/30/1998    (Dr. Linard Millers): Native LAD & mRCA CTO. 90% D1. Mod Cx. SVG-D1 CTO. SVG-RCA & SVG-OM along with LIMA-LAD patent;  LV NORMAL Fxn   CAROTID ENDARTERECTOMY Left Oct. 29, 2007   Dr. Oneida Alar:   CATARACT EXTRACTION W/PHACO Right 03/05/2017   Procedure: CATARACT EXTRACTION PHACO AND INTRAOCULAR LENS PLACEMENT ([place]);  Surgeon: Leandrew Koyanagi, MD;  Location: [facility] ORS;  Service: Ophthalmology;  Laterality: Right;  [place] 00:58.5 AP% 10.6 CDE 6.20 Fluid Pack lot #  [id number]   CATARACT EXTRACTION W/PHACO Left 04/15/2017   Procedure: CATARACT EXTRACTION PHACO AND INTRAOCULAR LENS PLACEMENT ([place])  Left;  Surgeon: Leandrew Koyanagi, MD;  Location: [place];  Service: Ophthalmology;  Laterality: Left;   COLONOSCOPY WITH PROPOFOL N/A 09/15/2017   Procedure: COLONOSCOPY WITH PROPOFOL;  Surgeon: Lucilla Lame, MD;  Location: [facility] ENDOSCOPY;  Service: Endoscopy;  Laterality: N/A;   COLONOSCOPY WITH PROPOFOL N/A 10/26/2018   Procedure: COLONOSCOPY WITH PROPOFOL;  Surgeon: Lucilla Lame, MD;  Location: [facility] ENDOSCOPY;  Service: Endoscopy;  Laterality: N/A;   CORONARY ARTERY BYPASS GRAFT  1998   LIMA-LAD, SVG-OM, SVG-RPDA, SVG-DIAG   CPET / MET  12/2012   Mild chronotropic incompetence - read 82% of predicted; also reduced effort; peak VO2 15.7 / 75% (did not reach Max effort) -- suggested ischemic response in last 1.5 minutes of exercise. Normal pulmonary function on PFTs but poor response to   CYSTOSCOPY W/ RETROGRADES Left 01/13/2020   Procedure: CYSTOSCOPY WITH RETROGRADE PYELOGRAM;  Surgeon: Billey Co, MD;  Location: [facility] ORS;  Service: Urology;  Laterality: Left;   CYSTOSCOPY WITH STENT PLACEMENT Left 12/26/2019   Procedure: CYSTOSCOPY WITH STENT PLACEMENT;  Surgeon: Billey Co, MD;  Location: [facility] ORS;  Service: Urology;  Laterality: Left;   CYSTOSCOPY/URETEROSCOPY/HOLMIUM LASER/STENT PLACEMENT Left 01/13/2020   Procedure: CYSTOSCOPY/URETEROSCOPY/HOLMIUM LASER/STENT EXCHANGE;  Surgeon: Billey Co, MD;  Location: [facility] ORS;  Service: Urology;  Laterality: Left;   ENDOVASCULAR REPAIR/STENT GRAFT N/A 05/18/2019   Procedure: ENDOVASCULAR REPAIR/STENT GRAFT;  Surgeon: Algernon Huxley, MD;  Location: [facility] INVASIVE CV LAB;; EVAR - 23 mm prox, 12 cm distal & x 18 cm length Gore Excluder Endoprosthesis Main body (via RFA distal to lowest Renal A); 30m x 14 cm L Contralateral Limb for L Iliac - extended with 8 mm x 6cm LifeStar stent; no additional R  Iliac PTA needed.    ESOPHAGOGASTRODUODENOSCOPY (EGD) WITH PROPOFOL N/A 09/15/2017   Procedure: ESOPHAGOGASTRODUODENOSCOPY (EGD) WITH PROPOFOL;  Surgeon: WLucilla Lame MD;  [facility]  Service: Endoscopy;  Laterality: N/A;   FRACTURE SURGERY Right 03/19/2015   wrist   FRACTURE SURGERY Left    HIATAL HERNIA REPAIR     ILIAC ARTERY STENT  12/15/2005   PTA and direct stenting rgt and lft common iliac arteries   INCISION AND DRAINAGE OF WOUND Left 01/29/2021   Procedure: IRRIGATION AND DEBRIDEMENT Left Wrist and Removal of Plate;  Surgeon: KThornton Park MD;  Location: [facility] ORS;  Service: Orthopedics;  Laterality: Left;   INTRAMEDULLARY (IM) NAIL INTERTROCHANTERIC Left 01/29/2021   Procedure: INTRAMEDULLARY (IM) NAIL INTERTROCHANTRIC;  Surgeon: KThornton Park MD;  Location: [facility] ORS;  Service: Orthopedics;  Laterality: Left;   KYPHOPLASTY N/A 04/05/2019   Procedure: KYPHOPLASTY T12, L3, L4 L5;  Surgeon: MHessie Knows  MD;  Location: [facility] ORS;  Service: Orthopedics;  Laterality: N/A;   NM GATED MYOVIEW ([place] HX)  05/'19; 8/'20   a) [facility]: Low Risk - no ischemia or infarct.  EF > 65%. no RWMA;; b) [facility]): Normal wall motion.  No ischemia or infarction.   OPEN REDUCTION INTERNAL FIXATION (ORIF) DISTAL RADIAL FRACTURE Left 01/13/2019   Procedure: OPEN REDUCTION INTERNAL FIXATION (ORIF) DISTAL  RADIAL FRACTURE - LEFT - SLEEP APNEA;  Surgeon: Hessie Knows, MD;  Location: [facility] ORS;  Service: Orthopedics;  Laterality: Left;   OPEN REDUCTION INTERNAL FIXATION (ORIF) DISTAL RADIAL FRACTURE Left 01/29/2021   Procedure: OPEN REDUCTION INTERNAL FIXATION (ORIF) DISTAL RADIAL FRACTURE;  Surgeon: Thornton Park, MD;  Location: [facility] ORS;  Service: Orthopedics;  Laterality: Left;   ORIF ELBOW FRACTURE Right 01/01/2017   Procedure: OPEN REDUCTION INTERNAL FIXATION (ORIF) ELBOW/OLECRANON FRACTURE;  Surgeon: Hessie Knows, MD;  Location: [facility] ORS;  Service: Orthopedics;  Laterality: Right;    ORIF WRIST FRACTURE Right 03/19/2015   Procedure: OPEN REDUCTION INTERNAL FIXATION (ORIF) WRIST FRACTURE;  Surgeon: Hessie Knows, MD;  Location: [facility] ORS;  Service: Orthopedics;  Laterality: Right;   SHOULDER ARTHROSCOPY Right    THORACIC AORTA - CAROTID ANGIOGRAM  October 2007   Dr. 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 Dilated left atrium.   TRANSTHORACIC ECHOCARDIOGRAM  04/2019   [facility] April 20, 2019): EF 55-60 %.  Mild LVH.  Relatively normal valves.   Social History   Socioeconomic History   Marital status: Widowed    Spouse name: Not on file   Number of children: 1   Years of education: Not on file   Highest education level: Some college, no degree  Occupational History   Occupation: retired  Tobacco Use   Smoking status: Former    Types: Cigarettes    Quit date: 08/04/1997    Years since quitting: 23.7   Smokeless tobacco: Never  Vaping Use   Vaping Use: Never used  Substance and Sexual Activity   Alcohol use: No   Drug use: No   Sexual activity: Not Currently    Birth control/protection: None  Other Topics Concern   Not on file  Social History Narrative   Resides at [facility] since 06/2018   He is a widowed father of one, grandfather of 5 with 3 stepchildren.    Now accompanied by his long-term significant other who lives just a few minutes away. .    She accompanies him with just not any clinic visit or hospitalization.  She also helps make sure he is taking his medication.   He has become more debilitated since his EVAR last September and has had more gait instability with balance issues.   He is not nearly as active as he used to be.  Really got deconditioned and has taken a long time to recover.   Currently undergoing physical therapy, Occupational Therapy treatments.      He has changed his  CODE STATUS to DNR.   Social Determinants of Health   Financial Resource Strain: Low Risk    Difficulty of Paying Living Expenses: Not hard at all  Food Insecurity: No Food Insecurity   Worried About Charity fundraiser in the Last Year: Never true   Ran Out of Food in the Last Year: Never true  Transportation Needs: No Transportation Needs  Lack of Transportation (Medical): No   Lack of Transportation (Non-Medical): No  Physical Activity: Inactive   Days of Exercise per Week: 0 days   Minutes of Exercise per Session: 0 min  Stress: No Stress Concern Present   Feeling of Stress : Only a little  Social Connections: Socially Isolated   Frequency of Communication with Friends and Family: More than three times a week   Frequency of Social Gatherings with Friends and Family: More than three times a week   Attends Religious Services: Never   Marine scientist or Organizations: No   Attends Archivist Meetings: Never   Marital Status: Widowed   Family History  Problem Relation Age of Onset   Ulcers Mother        Peptic   Dementia Mother    Alcohol abuse Father    Allergies  Allergen Reactions   Clonazepam Other (See Comments)    Altered mental status   Codeine Other (See Comments)    Altered mental status.   Ropinirole Swelling    Other reaction(s): Hallucination   Hydrocodone     confusion   Niacin And Related Other (See Comments)    Flushing of skin   Oxycodone Other (See Comments)    confusion confusion   Tramadol     Other reaction(s): Hallucination   Prior to Admission medications   Medication Sig Start Date End Date Taking? Authorizing Provider  acetaminophen (TYLENOL) 500 MG tablet Take 1,000 mg by mouth in the morning and at bedtime.   Yes [provider]  benzocaine (HURRICAINE) 20 % GEL Use as directed 1 application in the mouth or throat 4 (four) times daily.   Yes [provider]  Cholecalciferol 50 MCG (2000 UT) TBDP Take  2,000 Units by mouth daily.   Yes [provider]  ezetimibe (ZETIA) 10 MG tablet TAKE 1 TABLET BY MOUTH DAILY 12/21/20  Yes [name], MD  finasteride (PROSCAR) 5 MG tablet TAKE ONE TABLET BY MOUTH EVERY DAY 12/21/20  Yes Stoioff, Ronda Fairly, MD  magic mouthwash SOLN Take 30 mLs by mouth 4 (four) times daily.   Yes [provider]  Magnesium Oxide 500 MG TABS Take 500 mg by mouth every evening.   Yes [provider]  mirtazapine (REMERON) 7.5 MG tablet Take 7.5 mg by mouth at bedtime. 12/26/20  Yes [provider]  MYRBETRIQ 50 MG TB24 tablet TAKE ONE TABLET BY MOUTH EVERY DAY Patient taking differently: Take 50 mg by mouth at bedtime. 12/21/20  Yes Stoioff, Ronda Fairly, MD  Polyethyl Glycol-Propyl Glycol (SYSTANE) 0.4-0.3 % SOLN Place 1 drop into both eyes at bedtime.   Yes [provider]  QUEtiapine (SEROQUEL) 25 MG tablet Take 25 mg by mouth at bedtime.   Yes [provider]  rosuvastatin (CRESTOR) 20 MG tablet TAKE ONE TABLET BY MOUTH EVERY DAY 12/21/20  Yes [name], MD  feeding supplement (ENSURE ENLIVE / ENSURE PLUS) LIQD Take 237 mLs by mouth 3 (three) times daily between meals. 03/05/21   Nicole Kindred A, DO  fluticasone (FLONASE) 50 MCG/ACT nasal spray PLACE TWO SPRAYS IN BOTH NOSRTILS EVERY DAY AS NEEDED FOR ALLERGIES Patient not taking: Reported on 05/14/2021 08/07/20   [name], MD   CT Hip Right Wo Contrast  Result Date: 05/14/2021 CLINICAL DATA:  [place].  Abnormal right hip radiographs. EXAM: CT OF THE RIGHT HIP WITHOUT CONTRAST TECHNIQUE: Multidetector CT imaging of the right hip was performed according  to the standard protocol. Multiplanar CT image reconstructions were also generated. COMPARISON:  Radiographs, same date. FINDINGS: As demonstrated on the radiographs there is a cortical fracture involving the intertrochanteric region of the right hip anteriorly. This could be an acute fracture or possibly a stress  fracture. I do not see any definite findings for a through and through fracture. There is a healing fracture involving the inferior pubic ramus on the right side. The right hip is normally located. There are moderate degenerative changes and chondrocalcinosis. The acetabulum is intact. There is a "mass" involving the lateral aspect of the gluteus maximus muscle adjacent to the trochanteric region of the femur. This is likely a partially liquified hematoma related to trauma. Vascular calcifications are noted with a right iliac artery stent. No significant intrapelvic abnormalities are identified. - IMPRESSION: 1. Nondisplaced anterior cortical fracture involving the intertrochanteric region of the right hip. This could be an acute fracture or possibly a stress fracture. 2. Healing fracture involving the inferior pubic ramus on the right side. 3. Probable liquified hematoma involving lateral aspect of the right gluteus maximus muscle. 4. Moderate degenerative changes and chondrocalcinosis. Electronically Signed   By: Marijo Sanes M.D.   On: 05/14/2021 17:14   MR HIP RIGHT WO CONTRAST  Result Date: 05/15/2021 CLINICAL DATA:  Right hip fracture. Evaluate for extent of the intertrochanteric fracture. EXAM: MR OF THE RIGHT HIP WITHOUT CONTRAST TECHNIQUE: Multiplanar, multisequence MR imaging was performed. No intravenous contrast was administered. COMPARISON:  None. FINDINGS: Bones: 1. No periosteal reaction or bone destruction. No aggressive osseous lesion. Marrow edema in the sacral ala bilaterally consistent with sacral insufficiency fractures. Degenerative disease with disc height loss of the lower lumbar spine. Articular cartilage and labrum Articular cartilage: Partial-thickness cartilage loss of the femoral head and acetabulum bilaterally. Labrum: Grossly intact, but evaluation is limited by lack of intraarticular fluid. Joint or bursal effusion Joint effusion:  No hip joint effusion.  No SI joint effusion.  Bursae:  No bursa formation. Muscles and tendons Flexors: Normal. Extensors: Normal. Abductors: Normal. Adductors: Normal. Gluteals: Severe muscle edema in the gluteal musculature most consistent with muscle strain. 6.3 cm heterogeneous fluid collection in the right gluteus maximus muscle most consistent with an intramuscular hematoma adjacent to the greater trochanter. Hamstrings: Normal. Other findings No pelvic free fluid. No fluid collection or hematoma. No inguinal lymphadenopathy. No inguinal hernia. IMPRESSION: 1. Nondisplaced right intertrochanteric fracture extending from the greater trochanter to the lesser trochanter without angulation. 2. Bilateral nondisplaced sacral insufficiency fractures with associated marrow edema. 3. Healed right superior and inferior pubic rami fractures with minimal residual marrow edema. 4. Left intertrochanteric fracture transfixed with a intramedullary nail and interlocking screw without failure or complication. Mild residual marrow edema within the intertrochanteric region of the left hip. 5. Severe muscle strain of the gluteal musculature with a 6.3 cm intramuscular hematoma in the right gluteus maximus muscle. 6. Partial-thickness cartilage loss of bilateral hips. Electronically Signed   By: Kathreen Devoid M.D.   On: 05/15/2021 06:45   DG Chest Portable 1 View  Result Date: 05/14/2021 CLINICAL DATA:  Recent fall with history of hip fracture EXAM: PORTABLE CHEST 1 VIEW COMPARISON:  02/02/2021 FINDINGS: Cardiac shadow is within normal limits. Postsurgical changes are noted. The lungs are well aerated bilaterally no focal infiltrate. No acute bony abnormality is seen. Skin fold is noted over the left chest. IMPRESSION: No acute abnormality noted. Electronically Signed   By: Inez Catalina M.D.   On: 05/14/2021 15:18  DG Hip Unilat W or Wo Pelvis 2-3 Views Right  Result Date: 05/14/2021 CLINICAL DATA:  Fall EXAM: DG HIP (WITH OR WITHOUT PELVIS) 2-3V RIGHT COMPARISON:   Pelvis radiographs 01/24/2021, 04/08/2021 FINDINGS: There is a fracture plane through the anterior cortex of the proximal femur seen on the cross-table lateral projection. This probably corresponds to a defect in the cortex along the greater trochanter on the frontal projection. Femoroacetabular alignment is maintained. Intramedullary rod and screw fixation of the left femur is partially imaged. The SI joints and symphysis pubis are intact. Vascular stents and post kyphoplasty changes in the lower lumbar spine are noted. IMPRESSION: Nondisplaced fracture of the proximal right femur as above. Electronically Signed   By: Valetta Mole M.D.   On: 05/14/2021 15:19    Positive ROS: All other systems have been reviewed and were otherwise negative with the exception of those mentioned in the HPI and as above.  Physical Exam: General: Alert, no acute distress Cardiovascular: No pedal edema Respiratory: No cyanosis, no use of accessory musculature GI: No organomegaly, abdomen is soft and non-tender Skin: No lesions in the area of chief complaint Neurologic: Sensation intact distally  MUSCULOSKELETAL: tenderness about the right hip, unable to perform straight leg raise, RLE is neurovascularly intact  Assessment: 87yo male with dementia who sustained a non-displaced right intertrochanteric hip fracture after mechanical fall a few days ago at his assisted living facility.  Plan: - Appreciate hospitalist team management - Recommend right hip IMN, to allow for immediate post-op weight-bearing with PT - currently scheduled for this afternoon - Keep NPO - Will discuss this plan with the patient's family   Renee Harder, MD    05/15/2021 7:52 AM

## 2021-05-15 NOTE — Transfer of Care (Signed)
Immediate Anesthesia Transfer of Care Note  Patient: [name]  Procedure(s) Performed: INTRAMEDULLARY (IM) NAIL INTERTROCHANTRIC (Right: Hip)  Patient Location: PACU  Anesthesia Type:General  Level of Consciousness: drowsy and patient cooperative  Airway & Oxygen Therapy: Patient Spontanous Breathing and Patient connected to nasal cannula oxygen  Post-op Assessment: Report given to RN and Post -op Vital signs reviewed and stable  Post vital signs: Reviewed and stable  Last Vitals:  Vitals Value Taken Time  BP 111/79 05/15/21 1801  Temp    Pulse 75 05/15/21 1802  Resp 18 05/15/21 1802  SpO2 93 % 05/15/21 1802  Vitals shown include unvalidated device data.  Last Pain:  Vitals:   05/15/21 1542  TempSrc: Axillary  PainSc:          Complications: No notable events documented.

## 2021-05-15 NOTE — TOC Progression Note (Signed)
Transition of Care [facility]) - Progression Note    Patient Details  Name: [name] MRN: [id number] Date of Birth: [birth year]  Transition of [facility]) CM/SW [place], RN Phone Number: 05/15/2021, 9:47 AM  Clinical Narrative:    The patient resides at the Home [facility], he fell on Monday and it was witnessed, He is to have Surgery later today and then PT will evaluate, he is likely to Need STR SNF, TOC will work up after PT evaluates him with the recommendations.        Expected Discharge Plan and Services                                                 Social Determinants of Health (SDOH) Interventions    Readmission Risk Interventions Readmission Risk Prevention Plan 03/04/2021 12/28/2019 05/19/2019  Transportation Screening Complete Complete Complete  HRI or Home Care Consult - - Complete  Social Work Consult for [place] Planning/Counseling - - Complete  Palliative Care Screening - - Not Applicable  Medication Review Press photographer) Complete Complete Complete  PCP or Specialist appointment within 3-5 days of discharge Complete - -  [place] or Home Care Consult Complete Complete -  SW Recovery Care/Counseling Consult Complete - -  Palliative Care Screening Not Applicable - -  [place] Not Applicable Complete -  Some recent data might be hidden

## 2021-05-15 NOTE — Progress Notes (Signed)
PT Cancellation Note  Patient Details Name: [name] MRN: [id number] DOB: April 16, 1934   Cancelled Treatment:    Reason Eval/Treat Not Completed:  (Acknowledge new orders, per chart review pt going to OR this date. Will await new orders/order update.)  8:23 AM, 05/15/21 Etta Grandchild, PT, DPT Physical Therapist - [facility]  [phone] ([place])    [place] C 05/15/2021, 8:23 AM

## 2021-05-15 NOTE — Op Note (Signed)
DATE OF SURGERY:  05/15/2021  TIME: 5:50 PM  PATIENT NAME:  [name]  AGE: 87 y.o.  PRE-OPERATIVE DIAGNOSIS:  Right Hip Fracture  POST-OPERATIVE DIAGNOSIS:  SAME  PROCEDURE:  RIGHT INTRAMEDULLARY (IM) NAIL INTERTROCHANTRIC  SURGEON:  Renee Harder  EBL:  [id number] cc  COMPLICATIONS:  none  OPERATIVE IMPLANTS: [employer] Intertan femoral nail 10 mm x 180 mm  PREOPERATIVE INDICATIONS:  [name] is a 87 y.o. year old male with dementia who fell and suffered a hip fracture. He was brought into the ER and then admitted and optimized and then elected for surgical intervention.    The risks benefits and alternatives were discussed with the patient including but not limited to the risks of nonoperative treatment, versus surgical intervention including infection, bleeding, nerve injury, malunion, nonunion, hardware prominence, hardware failure, need for hardware removal, blood clots, cardiopulmonary complications, morbidity, mortality, among others, and they were willing to proceed.    OPERATIVE PROCEDURE:  The patient was brought to the operating room and placed in the supine position.  General anesthesia was administered, with a foley. He was placed on the fracture table.  Closed reduction was performed under C-arm guidance. The length of the femur was also measured using fluoroscopy. Time out was then performed after sterile prep and drape. He received preoperative antibiotics.  Incision was made proximal to the greater trochanter. A guidewire was placed in the appropriate position. Confirmation was made on AP and lateral views. The above-named nail was opened. I opened the proximal femur with a reamer. I then placed the nail by hand easily down. I did not need to ream the femur.  Once the nail was completely seated, I placed a guidepin into the femoral head into the center center position through a second incision.  I measured the length, and then reamed the lateral  cortex and up into the head. I then placed the two interlocking lag screws. Slight compression was applied. Anatomic fixation achieved. Bone quality was adequate.  I then secured the proximal interlocking screws.  I then removed the instruments, and took final C-arm pictures AP and lateral the entire length of the leg. Anatomic reconstruction was achieved, and the wounds were irrigated copiously and closed with Vicryl  followed by staples and dry sterile dressing. Sponge and needle count were correct.   The patient was awakened and returned to PACU in stable and satisfactory condition. There no complications and the patient tolerated the procedure well.   POSTOPERATIVE PLAN: He will be weightbearing as tolerated.   Ok to start DVT ppx POD#1 - Lovenox x 14 days Dressing change by nursing staff as needed to keep dressing clean and dry Outpatient f/u in clinic in 2 weeks for staple removal and xrays  Renee Harder

## 2021-05-16 ENCOUNTER — Encounter: Payer: Self-pay | Admitting: Orthopaedic Surgery

## 2021-05-16 DIAGNOSIS — S7291XA Unspecified fracture of right femur, initial encounter for closed fracture: Secondary | ICD-10-CM | POA: Diagnosis not present

## 2021-05-16 LAB — HIV ANTIBODY (ROUTINE TESTING W REFLEX): HIV Screen 4th Generation wRfx: NONREACTIVE

## 2021-05-16 LAB — CBC
HCT: 28.4 % — ABNORMAL LOW (ref 39.0–52.0)
Hemoglobin: 9.5 g/dL — ABNORMAL LOW (ref 13.0–17.0)
MCH: 32.8 pg (ref 26.0–34.0)
MCHC: 33.5 g/dL (ref 30.0–36.0)
MCV: 97.9 fL (ref 80.0–100.0)
Platelets: 96 10*3/uL — ABNORMAL LOW (ref 150–400)
RBC: 2.9 MIL/uL — ABNORMAL LOW (ref 4.22–5.81)
RDW: 16.2 % — ABNORMAL HIGH (ref 11.5–15.5)
WBC: 13.6 10*3/uL — ABNORMAL HIGH (ref 4.0–10.5)
nRBC: 0 % (ref 0.0–0.2)

## 2021-05-16 LAB — BASIC METABOLIC PANEL
Anion gap: 6 (ref 5–15)
BUN: 25 mg/dL — ABNORMAL HIGH (ref 8–23)
CO2: 24 mmol/L (ref 22–32)
Calcium: 8.6 mg/dL — ABNORMAL LOW (ref 8.9–10.3)
Chloride: 111 mmol/L (ref 98–111)
Creatinine, Ser: 0.87 mg/dL (ref 0.61–1.24)
GFR, Estimated: >60 mL/min (ref >60)
Glucose, Bld: 100 mg/dL — ABNORMAL HIGH (ref 70–99)
Potassium: 4.6 mmol/L (ref 3.5–5.1)
Sodium: 141 mmol/L (ref 135–145)

## 2021-05-16 LAB — HEPATITIS C ANTIBODY: HCV Ab: NONREACTIVE

## 2021-05-16 MED ORDER — KETOROLAC TROMETHAMINE 15 MG/ML IJ SOLN
15.0000 mg | Freq: Four times a day (QID) | INTRAMUSCULAR | Status: DC | PRN
  Administered 2021-05-16: 15 mg via INTRAVENOUS
  Filled 2021-05-16: qty 1

## 2021-05-16 MED ORDER — ACETAMINOPHEN 325 MG PO TABS: 650.0000 mg | ORAL_TABLET | Freq: Four times a day (QID) | ORAL | Status: DC | PRN

## 2021-05-16 MED ORDER — ENOXAPARIN SODIUM 40 MG/0.4ML IJ SOSY
40.0000 mg | PREFILLED_SYRINGE | INTRAMUSCULAR | Status: DC
  Administered 2021-05-17: 40 mg via SUBCUTANEOUS
  Filled 2021-05-16: qty 0.4

## 2021-05-16 MED ORDER — ACETAMINOPHEN 325 MG PO TABS
650.0000 mg | ORAL_TABLET | Freq: Four times a day (QID) | ORAL | Status: DC | PRN
  Administered 2021-05-16: 650 mg via ORAL
  Filled 2021-05-16: qty 2

## 2021-05-16 NOTE — Progress Notes (Signed)
OT Cancellation Note  Patient Details Name: [name] MRN: [id number] DOB: 21-Jun-1934   Cancelled Treatment:    Reason Eval/Treat Not Completed: Fatigue/lethargy limiting ability to participate OT consult received and chart reviewed. Pt sleeping soundly at this time on Bipap. Pt's son and the CNA reporting that he was restless overnight. Pt does not rouse to sound of therapist's voice this AM. Pt's son politely requests that OT attempt evaluation closer to lunch time or later to allow him to rest. Will f/u at later date/time as able for OT evaluation. Thank you.  Gerrianne Scale, [place], OTR/L ascom [phone] 05/16/21, 9:14 AM

## 2021-05-16 NOTE — Evaluation (Deleted)
Physical Therapy Evaluation Patient Details Name: [name] MRN: [id number] DOB: September 22, 1933 Today's Date: 05/16/2021  History of Present Illness      Clinical Impression  Pt is a pleasant 87 year old male who presents to PT/OT co-evaluation s/p day #1 R intramedullary nailing and currently WBAT. Pt is from [place] in memory care unit and required assistance with mobility at baseline. Currently, pt requiring minA + 2 for bed mobility and modA + 2 for sit to stand transfers. Unable to take steps or transfer to chair during evaluation due to R hip pain. Pt demonstrating decreased activity tolerance, strength, ROM, balance and limited significantly by pain. Recommending returning to [facility] unit with home health PT services to improve deficits above. Pt will continue to benefit from skilled PT services during admission to progress mobility and reduce risk of falls.      Recommendations for follow up therapy are one component of a multi-disciplinary discharge planning process, led by the attending physician.  Recommendations may be updated based on patient status, additional functional criteria and insurance authorization.  Follow Up Recommendations      Equipment Recommendations       Recommendations for Other Services       Precautions / Restrictions        Mobility  Bed Mobility                    Transfers                  .  Ambulation/Gait         Stairs     Wheelchair Mobility    Modified Rankin (Stroke Patients Only)       Balance                              Pertinent Vitals/Pain      Home Living                        Prior Function                 Hand Dominance        Extremity/Trunk Assessment                Communication      Cognition                               General Comments      Exercises     Assessment/Plan    PT Assessment    PT  Problem List         PT Treatment Interventions      PT Goals (Current goals can be found in the Care Plan section)       Frequency     Barriers to discharge        Co-evaluation               AM-PAC PT "6 Clicks" Mobility  Outcome Measure                  End of Session              Time:  -      Charges:              Andrey Campanile, SPT   Andrey Campanile  05/16/2021, 4:13 PM

## 2021-05-16 NOTE — Progress Notes (Signed)
  Subjective:  Patient's son is at bedside. Reports that patient did well today. No complaints.  Objective:   VITALS:   Vitals:   05/15/21 2053 05/16/21 0433 05/16/21 0741 05/16/21 1550  BP: (!) 154/92 110/66 (!) 115/59 (!) 87/56  Pulse: 96 88 85 85  Resp: '18 16 16 16  '$ Temp: 97.6 F (36.4 C) 97.8 F (36.6 C) 98.2 F (36.8 C) 98 F (36.7 C)  TempSrc: Oral Oral Axillary Axillary  SpO2: 99% 96% 97% 95%  Weight:      Height:        PHYSICAL EXAM:  General: unable to communicate, responds to commands  RLE: incisions c/d/I, able to wiggle toes, cap refill < 2 sec  LABS  Results for orders placed or performed during the hospital encounter of 05/14/21 (from the past 24 hour(s))  HIV Antibody (routine testing w rflx)     Status: None   Collection Time: 05/16/21  3:42 AM  Result Value Ref Range   HIV Screen 4th Generation wRfx Non Reactive Non Reactive  Hepatitis C antibody     Status: None   Collection Time: 05/16/21  3:42 AM  Result Value Ref Range   HCV Ab NON REACTIVE NON REACTIVE  CBC     Status: Abnormal   Collection Time: 05/16/21  3:42 AM  Result Value Ref Range   WBC 13.6 (H) 4.0 - 10.5 K/uL   RBC 2.90 (L) 4.22 - 5.81 MIL/uL   Hemoglobin 9.5 (L) 13.0 - 17.0 g/dL   HCT 28.4 (L) 39.0 - 52.0 %   MCV 97.9 80.0 - 100.0 fL   MCH 32.8 26.0 - 34.0 pg   MCHC 33.5 30.0 - 36.0 g/dL   RDW 16.2 (H) 11.5 - 15.5 %   Platelets 96 (L) 150 - 400 K/uL   nRBC 0.0 0.0 - 0.2 %  Basic metabolic panel     Status: Abnormal   Collection Time: 05/16/21  3:42 AM  Result Value Ref Range   Sodium 141 135 - 145 mmol/L   Potassium 4.6 3.5 - 5.1 mmol/L   Chloride 111 98 - 111 mmol/L   CO2 24 22 - 32 mmol/L   Glucose, Bld 100 (H) 70 - 99 mg/dL   BUN 25 (H) 8 - 23 mg/dL   Creatinine, Ser 0.87 0.61 - 1.24 mg/dL   Calcium 8.6 (L) 8.9 - 10.3 mg/dL   GFR, Estimated >60 >60 mL/min   Anion gap 6 5 - 15   *Note: Due to a large number of results and/or encounters for the requested time period,  some results have not been displayed. A complete set of results can be found in Results Review.     Assessment/Plan: 1 Day Post-Op s/p R hip IMN - PT/OT: WBAT RLE - Ok to restart DVT ppx: will defer to hospitalist team re Eliquis vs lovenox - Dispo per PT/hospitalist team - F/u in clinic in 2-3 weeks for staple removal and xrays   Renee Harder , MD 05/16/2021, 5:03 PM

## 2021-05-16 NOTE — Plan of Care (Signed)

## 2021-05-16 NOTE — Progress Notes (Signed)
PT EVALUATION  Clinical Impression:   Pt is a pleasant 87 year old male who presents to PT/OT co-evaluation s/p day #1 R intramedullary nailing and currently WBAT. Pt is from [place] in memory care unit and required assistance with mobility at baseline. Currently, pt requiring minA + 2 for bed mobility and modA + 2 for sit to stand transfers. Unable to take steps or transfer to chair during evaluation due to R hip pain. Pt demonstrating decreased activity tolerance, strength, ROM, balance and limited significantly by pain. Recommending returning to [place] unit with home health PT services to improve deficits above. Pt will continue to benefit from skilled PT services during admission to progress mobility and reduce risk of falls.      05/16/21 1212  PT Visit Information  Last PT Received On 05/16/21  Assistance Needed +2  PT/OT/SLP Co-Evaluation/Treatment Yes  Reason for Co-Treatment Complexity of the patient's impairments (multi-system involvement);For patient/therapist safety;To address functional/ADL transfers  PT goals addressed during session Mobility/safety with mobility;Balance;Proper use of DME  OT goals addressed during session ADL's and self-care;Proper use of Adaptive equipment and DME  History of Present Illness SHAMEL ABIDI is a 87 y.o. male with medical history significant for dementia, hyperlipidemia, insomnia, BPH, presents to the emergency department from home for chief concerns of a fall 2 days ago. Pt underwent  R intramedullary nailing on 9/14 and is WBAT.  Precautions  Precautions Fall  Restrictions  Weight Bearing Restrictions Yes  RLE Weight Bearing WBAT  Home Living  Family/patient expects to be discharged to: Assisted living  [place] - 2 wheels  Additional Comments From [place] in [place] division  Prior Function  Level of [place] Needs assistance  Comments Pt unable to report prior needs but likely ambulated with  assistance  Communication  Communication HOH  Pain Assessment  Pain Assessment Faces  Faces Pain Scale 8  Pain Location R hip  Pain Descriptors / Indicators Grimacing;Guarding  Pain Intervention(s) Limited activity within patient's tolerance;Monitored during session;Repositioned  Cognition  Arousal/Alertness Awake/alert  Behavior During Therapy Agitated;WFL for tasks assessed/performed  Overall Cognitive Status History of cognitive impairments - at baseline  Area of Impairment Memory;Safety/judgement  General Comments Pt slightly agitated during session but able to redirect easily.  Upper Extremity Assessment  Upper Extremity Assessment Defer to OT evaluation  Lower Extremity Assessment  Lower Extremity Assessment Generalized weakness;RLE deficits/detail;LLE deficits/detail  RLE Deficits / Details Unable to formally test due to cognition. R hip pain limiting ROM and strength  LLE Deficits / Details Unable to formally test due to cognition. Grossly 4/5 during standing with RW  Bed Mobility  Overal bed mobility Needs Assistance  Bed Mobility Supine to Sit;Sit to Supine  Supine to sit +2 for physical assistance;Min assist  Sit to supine Min assist;+2 for physical assistance  General bed mobility comments Assistance with trunk and B LEs into and out of bed  Transfers  Overall transfer level Needs assistance  Equipment used Rolling walker (2 wheeled)  Transfers Sit to/from Stand  Sit to Stand Mod assist;+2 physical assistance;From elevated surface  General transfer comment ModA +2 for standing with RW. Pt unable to initiate forward lean due to R hip pain. Significant bed elevation to improve ease of transfer and patient height. Able to stand for 20 seconds before requesting to sit again. Decreased weight shift towards R side. Pt reporting dizzinesss and lightheadedness wtih standing.  Ambulation/Gait  General Gait Details Deferred due to  pain and decreased activity tolerance  Balance   Overall balance assessment Needs assistance  Sitting-balance support Bilateral upper extremity supported  Sitting balance-Leahy Scale Fair  Sitting balance - Comments Required B UE assistance to maintain balance; Unable to accept challenges  Standing balance support Bilateral upper extremity supported  Standing balance-Leahy Scale Poor  Standing balance comment Requiring physical assistance and B UE support to maintain standing balance. Pt weight decreased weight shift towards R side throughout  PT - End of Session  Equipment Utilized During Treatment Gait belt  Activity Tolerance Patient limited by pain  Patient left in bed;with call bell/phone within reach;with bed alarm set;Other (comment) (Bed in chair position)  Nurse Communication Mobility status  PT Assessment  PT Recommendation/Assessment Patient needs continued PT services  PT Visit Diagnosis Unsteadiness on feet (R26.81);Repeated falls (R29.6);Muscle weakness (generalized) (M62.81)  PT Problem List Decreased strength;Decreased range of motion;Decreased activity tolerance;Decreased balance;Decreased mobility;Decreased coordination;Decreased cognition;Decreased knowledge of use of DME;Decreased safety awareness;Pain  PT Plan  PT Frequency (ACUTE ONLY) 7X/week  PT Treatment/Interventions (ACUTE ONLY) DME instruction;Gait training;Functional mobility training;Therapeutic activities;Therapeutic exercise;Balance training;Neuromuscular re-education;Patient/family education  AM-PAC PT "6 Clicks" Mobility Outcome Measure (Version 2)  Help needed turning from your back to your side while in a flat bed without using bedrails? 2  Help needed moving from lying on your back to sitting on the side of a flat bed without using bedrails? 2  Help needed moving to and from a bed to a chair (including a wheelchair)? 2  Help needed standing up from a chair using your arms (e.g., wheelchair or bedside chair)? 2  Help needed to walk in hospital room? 1   Help needed climbing 3-5 steps with a railing?  1  6 Click Score 10  Consider Recommendation of Discharge To: CIR/SNF/LTACH  Progressive Mobility  What is the highest level of mobility based on the progressive mobility assessment? Level 3 (Stands with assist) - Balance while standing  and cannot march in place  Mobility Sit up in bed/chair position for meals  PT Recommendation  Follow Up Recommendations Home health PT;Supervision/Assistance - 24 hour (Return to Memory Care at [facility])  PT equipment None recommended by PT  Individuals Consulted  Consulted and Agree with Results and Recommendations Patient  Acute Rehab PT Goals  Patient Stated Goal to decreased R hip pain  PT Goal Formulation With patient  Time For Goal Achievement 05/30/21  Potential to Achieve Goals Fair  PT Time Calculation  PT Start Time (ACUTE ONLY) 1137  PT Stop Time (ACUTE ONLY) 1158  PT Time Calculation (min) (ACUTE ONLY) 21 min  PT General Charges  $$ ACUTE PT VISIT 1 Visit  PT Evaluation  $PT Eval Moderate Complexity 1 Mod  Written Expression  Dominant Hand Right   Andrey Campanile, SPT

## 2021-05-16 NOTE — TOC Progression Note (Signed)
Transition of Care [facility]) - Progression Note    Patient Details  Name: [name] MRN: [id number] Date of Birth: 04/10/1934  Transition of Care [facility]) CM/SW [place], RN Phone Number: 05/16/2021, 1:48 PM  Clinical Narrative:      [name] with [facility] at [facility] memory care, they have inhouse PT and would like an order for home health sent to them, The patient has a rolling walker and will need transportation to the Home place per the son [name].      Expected Discharge Plan and Services                                                 Social Determinants of Health (SDOH) Interventions    Readmission Risk Interventions Readmission Risk Prevention Plan 03/04/2021 12/28/2019 05/19/2019  Transportation Screening Complete Complete Complete  HRI or Home Care Consult - - Complete  Social Work Consult for [place] Planning/Counseling - - Complete  Palliative Care Screening - - Not Applicable  Medication Review Press photographer) Complete Complete Complete  PCP or Specialist appointment within 3-5 days of discharge Complete - -  [place] or Home Care Consult Complete Complete -  SW Recovery Care/Counseling Consult Complete - -  Palliative Care Screening Not Applicable - -  [place] Not Applicable Complete -  Some recent data might be hidden

## 2021-05-16 NOTE — Evaluation (Signed)
Occupational Therapy Evaluation Patient Details Name: [name] MRN: [id number] DOB: 1934-03-17 Today's Date: 05/16/2021   History of Present Illness [name] is a 86 y.o. male with medical history significant for dementia, hyperlipidemia, insomnia, BPH, presents to the emergency department from home for chief concerns of a fall 2 days ago. Pt underwent  R intramedullary nailing on 9/14 and is WBAT.   Clinical Impression   Pt seen for OT evaluation this date in setting of acute hospitalization s/p R IM nailing. Pt unreliable historian. His son reports that pt was walking with assistance with RW and require assist from staff for bathing and dressing. Pt presents this date with decreased fxl activity tolerance and R sided pain superimposed on baseline weakness, impacting his ability to safely and efficiently contribute to ADLs/ADL mobility. Pt requires: MIN/MOD A For UB ADLs as well as adaptation: use of built up handles provided by OT. Pt requires MAX/TOTAL A for LB ADLs. MAX A to come to EOB sitting. P static sitting balance. Anticipate pt will require f/u OT services in his memory care setting as well as 24/7 SUPV for safety/fall prevention upon d/c from acute setting. Will continue to follow acutely.      Recommendations for follow up therapy are one component of a multi-disciplinary discharge planning process, led by the attending physician.  Recommendations may be updated based on patient status, additional functional criteria and insurance authorization.   Follow Up Recommendations  Home health OT;Supervision/Assistance - 24 hour (return to memory care unit)    Equipment Recommendations  3 in 1 bedside commode;Tub/shower seat    Recommendations for Other Services       Precautions / Restrictions Precautions Precautions: Fall Restrictions Weight Bearing Restrictions: Yes RLE Weight Bearing: Weight bearing as tolerated      Mobility Bed Mobility Overal bed  mobility: Needs Assistance Bed Mobility: Supine to Sit;Sit to Supine     Supine to sit: +2 for physical assistance;Min assist Sit to supine: Min assist;+2 for physical assistance   General bed mobility comments: Assistance with trunk and B LEs into and out of bed    Transfers Overall transfer level: Needs assistance Equipment used: Rolling walker (2 wheeled) Transfers: Sit to/from Stand Sit to Stand: Mod assist;+2 physical assistance;From elevated surface         General transfer comment: increased time, cues for safety, cues for hand palcement. Pt fearful of scooting to edge of sitting surface, noted to lean backward.    Balance Overall balance assessment: Needs assistance Sitting-balance support: Bilateral upper extremity supported Sitting balance-Leahy Scale: Fair     Standing balance support: Bilateral upper extremity supported Standing balance-Leahy Scale: Poor Standing balance comment: requries at least MOD A to sustain static standing with UE suppot and therapist assist bilaterally.                           ADL either performed or assessed with clinical judgement   ADL Overall ADL's : Needs assistance/impaired                                       General ADL Comments: requires MIN/MOD A For UB ADLs as well as adaptation: use of built up handles provided by OT. Pt requires MAX/TOTAL A for LB ADLs. MAX A to come to EOB sitting. P static sitting balance.  Vision Baseline Vision/History: 1 Wears glasses Patient Visual Report: No change from baseline Additional Comments: difficult to formally assess d/t cogntiion     Perception     Praxis      Pertinent Vitals/Pain Pain Assessment: Faces Faces Pain Scale: Hurts whole lot Pain Location: R hip Pain Descriptors / Indicators: Grimacing;Guarding Pain Intervention(s): Limited activity within patient's tolerance;Monitored during session;Repositioned     Hand Dominance Right    Extremity/Trunk Assessment Upper Extremity Assessment Upper Extremity Assessment: Generalized weakness;Difficult to assess due to impaired cognition   Lower Extremity Assessment Lower Extremity Assessment: Generalized weakness;Difficult to assess due to impaired cognition       Communication Communication Communication: HOH   Cognition Arousal/Alertness: Awake/alert Behavior During Therapy: Agitated;WFL for tasks assessed/performed Overall Cognitive Status: History of cognitive impairments - at baseline Area of Impairment: Memory;Safety/judgement;Problem solving;Orientation                 Orientation Level: Disoriented to;Place;Time;Situation   Memory: Decreased short-term memory;Decreased recall of precautions   Safety/Judgement: Decreased awareness of safety;Decreased awareness of deficits   Problem Solving: Slow processing;Decreased initiation;Difficulty sequencing;Requires verbal cues;Requires tactile cues General Comments: Pt slightly agitated during session but able to redirect easily.   General Comments       Exercises     Shoulder Instructions      Home Living Family/patient expects to be discharged to:: Assisted living                             Home Equipment: Walker - 2 wheels   Additional Comments: From Home Place in [place] division      Prior Functioning/Environment Level of Independence: Needs assistance  Gait / Transfers Assistance Needed: Pt's son indicated that pt amb with RW with assistance at all times. Endorses several falls in the past ADL's / Homemaking Assistance Needed: Pt requires assist for bathing/dressing which he is assisted with in his memory care unit.   Comments: Pt poor historian, most information obtained from previous therapy evaluations or pt's son        OT Problem List: Decreased strength;Decreased activity tolerance;Impaired balance (sitting and/or standing);Decreased safety awareness;Decreased  cognition      OT Treatment/Interventions: Self-care/ADL training;Therapeutic exercise;DME and/or AE instruction;Therapeutic activities;Balance training    OT Goals(Current goals can be found in the care plan section) Acute Rehab OT Goals Patient Stated Goal: to decreased R hip pain OT Goal Formulation: Patient unable to participate in goal setting Time For Goal Achievement: 05/30/21 Potential to Achieve Goals: Fair ADL Goals Pt Will Perform Grooming: with supervision;sitting (with G sitting balance for ~2-3 mins to increase sitting tolerance) Pt Will Transfer to Toilet: with min assist;with mod assist;stand pivot transfer;bedside commode Pt/caregiver will Perform Home Exercise Program: Increased strength;Both right and left upper extremity;With minimal assist  OT Frequency: Min 1X/week   Barriers to D/C:            Co-evaluation PT/OT/SLP Co-Evaluation/Treatment: Yes Reason for Co-Treatment: Complexity of the patient's impairments (multi-system involvement);For patient/therapist safety;Necessary to address cognition/behavior during functional activity;To address functional/ADL transfers PT goals addressed during session: Mobility/safety with mobility;Balance;Proper use of DME OT goals addressed during session: ADL's and self-care;Proper use of Adaptive equipment and DME      AM-PAC OT "6 Clicks" Daily Activity     Outcome Measure Help from another person eating meals?: A Little Help from another person taking care of personal grooming?: A Little Help from another person toileting, which  includes using toliet, bedpan, or urinal?: A Lot Help from another person bathing (including washing, rinsing, drying)?: A Lot Help from another person to put on and taking off regular upper body clothing?: A Lot Help from another person to put on and taking off regular lower body clothing?: Total 6 Click Score: 13   End of Session Equipment Utilized During Treatment: Gait belt;Rolling  walker Nurse Communication: Mobility status  Activity Tolerance: Patient tolerated treatment well Patient left: in bed;with call bell/phone within reach;with bed alarm set (chair position)  OT Visit Diagnosis: Unsteadiness on feet (R26.81);History of falling (Z91.81)                Time: [id number] OT Time Calculation (min): 27 min Charges:  OT General Charges $OT Visit: 1 Visit OT Evaluation $OT Eval Moderate Complexity: [place], [place], OTR/L ascom [phone] 05/16/21, 5:05 PM

## 2021-05-16 NOTE — Progress Notes (Addendum)
PROGRESS NOTE    [name]  [id number] DOB: Jan 10, 1934 DOA: 05/14/2021 PCP: Monico Blitz, NP  Outpatient Specialists: cardiology, urology, pulmonology, vascular surgery    Brief Narrative:   Hx multiple chronic conditions, history left hip fracture repaired earlier this year, who presented on 9/13 after witnessed fall at home 2 days prior. Imaging shows right nondisplaced intertrochanteric fracture. Recent admit for acute cystitis.   Assessment & Plan:   Active Problems:   Essential hypertension   B12 deficiency   Carotid artery occlusion without infarction   AAA (abdominal aortic aneurysm) without rupture (HCC)   Hip fracture (HCC)   CAD (coronary artery disease)   Femur fracture, right ([facility])   # Right intertrochantic femur fracture # Gluteal hematoma Pain well controlled. S/p operative repair 9/14. - toradol, tylenol for pain control - PT/OT consults pending, weightbearing as tolerated. If no snf has in-house pt/ot at living facility, will just need home health orders @ discharge - outpt ortho f/u 2 wks  # Thrombocytopenia Plts 80s/90s, appears to be new - smear, hiv, hcv unremarkable - would advise outpt labs, hematology referral if persistently less than 100  # History hypothyroidism? TSH wnl  # Dementia Calm - home seroquel qhs, mirtazapine  # CAD # History CABG - cont home zetia and crestor  # Paroxysmal a-fib Eliquis held recent admission 2/2 hematuria. Metoprolol held 2/2 hypotension. Here bp mildly elevated, RR. - have added back metoprolol 12.5 bid - hold eliquis for now given recent hematuria, falls  # PAD # AAA s/p repair Lower extremities warm - continue zetia and crestor when taking po  # LUTS # History prostate cancer - cont proscar and myrbetriq when taking po  # OSA - cpap qhs  DVT prophylaxis: lovenox, will monitor plts while taking Code Status: DNR Family Communication: son updated telphonically 9/14  Level of care:  Med-Surg Status is: Observation  The patient will require care spanning > 2 midnights and should be moved to inpatient because: Inpatient level of care appropriate due to severity of illness  Dispo: The patient is from: independent living              Anticipated d/c is to: tbd              Patient currently is not medically stable to d/c.   Difficult to place patient No   Consultants:  orthopedics  Procedures: Plan for ORIF today  Antimicrobials:  Cefazolin pre-operative    Subjective: Sitting up in bed. Doesn't appear in pain. Confused.  Objective: Vitals:   05/15/21 1900 05/15/21 2053 05/16/21 0433 05/16/21 0741  BP:  (!) 154/92 110/66 (!) 115/59  Pulse:  96 88 85  Resp:  '18 16 16  '$ Temp:  97.6 F (36.4 C) 97.8 F (36.6 C) 98.2 F (36.8 C)  TempSrc:  Oral Oral Axillary  SpO2: 94% 99% 96% 97%  Weight:      Height:        Intake/Output Summary (Last 24 hours) at 05/16/2021 1338 Last data filed at 05/16/2021 0503 Gross per 24 hour  Intake 551.67 ml  Output 400 ml  Net 151.67 ml   Filed Weights   05/14/21 1418 05/15/21 0937  Weight: 58.3 kg 58.7 kg    Examination:  General exam: Appears calm and comfortable  Respiratory system: Clear to auscultation. Respiratory effort normal. Cardiovascular system: S1 & S2 heard, RRR. Soft systolic murmur Gastrointestinal system: Abdomen is nondistended, soft and nontender. No organomegaly or masses felt. Normal  bowel sounds heard. Central nervous system: Alert, not oriented. Able to move all extremities Extremities: warm Skin: echymoses lower extremities Psychiatry: calm    Data Reviewed: I have personally reviewed following labs and imaging studies  CBC: Recent Labs  Lab 05/14/21 1429 05/15/21 0527 05/16/21 0342  WBC 8.0 7.0 13.6*  NEUTROABS 4.6  --   --   HGB 11.6* 9.8* 9.5*  HCT 34.6* 29.6* 28.4*  MCV 96.6 97.4 97.9  PLT 99* 85* 96*   Basic Metabolic Panel: Recent Labs  Lab 05/14/21 1429  05/15/21 0527 05/16/21 0342  NA 141 139 141  K 4.1 3.8 4.6  CL 108 111 111  CO2 '27 24 24  '$ GLUCOSE 98 83 100*  BUN 19 19 25*  CREATININE 0.79 0.77 0.87  CALCIUM 8.8* 8.4* 8.6*   GFR: Estimated Creatinine Clearance: 51.5 mL/min (by C-G formula based on SCr of 0.87 mg/dL). Liver Function Tests: No results for input(s): AST, ALT, ALKPHOS, BILITOT, PROT, ALBUMIN in the last 168 hours. No results for input(s): LIPASE, AMYLASE in the last 168 hours. No results for input(s): AMMONIA in the last 168 hours. Coagulation Profile: No results for input(s): INR, PROTIME in the last 168 hours. Cardiac Enzymes: No results for input(s): CKTOTAL, CKMB, CKMBINDEX, TROPONINI in the last 168 hours. BNP (last 3 results) No results for input(s): PROBNP in the last 8760 hours. HbA1C: No results for input(s): HGBA1C in the last 72 hours. CBG: No results for input(s): GLUCAP in the last 168 hours. Lipid Profile: No results for input(s): CHOL, HDL, LDLCALC, TRIG, CHOLHDL, LDLDIRECT in the last 72 hours. Thyroid Function Tests: Recent Labs    05/15/21 0527  TSH 1.660   Anemia Panel: No results for input(s): VITAMINB12, FOLATE, FERRITIN, TIBC, IRON, RETICCTPCT in the last 72 hours. Urine analysis:    Component Value Date/Time   COLORURINE YELLOW (A) 05/14/2021 2119   APPEARANCEUR CLEAR (A) 05/14/2021 2119   APPEARANCEUR Cloudy (A) 01/25/2020 1009   LABSPEC 1.018 05/14/2021 2119   LABSPEC 1.011 10/01/2012 0420   PHURINE 6.0 05/14/2021 2119   GLUCOSEU NEGATIVE 05/14/2021 2119   GLUCOSEU Negative 10/01/2012 0420   HGBUR NEGATIVE 05/14/2021 2119   BILIRUBINUR NEGATIVE 05/14/2021 2119   BILIRUBINUR Negative 11/07/2020 1115   BILIRUBINUR Negative 01/25/2020 1009   BILIRUBINUR Negative 10/01/2012 0420   KETONESUR NEGATIVE 05/14/2021 2119   PROTEINUR NEGATIVE 05/14/2021 2119   UROBILINOGEN 0.2 11/07/2020 1115   NITRITE NEGATIVE 05/14/2021 2119   LEUKOCYTESUR NEGATIVE 05/14/2021 2119    LEUKOCYTESUR Negative 10/01/2012 0420   Sepsis Labs: '@LABRCNTIP'$ (procalcitonin:4,lacticidven:4)  ) Recent Results (from the past 240 hour(s))  SARS CORONAVIRUS 2 (TAT 6-24 HRS) Nasopharyngeal Nasopharyngeal Swab     Status: None   Collection Time: 05/14/21  3:28 PM   Specimen: Nasopharyngeal Swab  Result Value Ref Range Status   SARS Coronavirus 2 NEGATIVE NEGATIVE Final    Comment: (NOTE) SARS-CoV-2 target nucleic acids are NOT DETECTED.  The SARS-CoV-2 RNA is generally detectable in upper and lower respiratory specimens during the acute phase of infection. Negative results do not preclude SARS-CoV-2 infection, do not rule out co-infections with other pathogens, and should not be used as the sole basis for treatment or other patient management decisions. Negative results must be combined with clinical observations, patient history, and epidemiological information. The expected result is Negative.  Fact Sheet for Patients: SugarRoll.be  Fact Sheet for Healthcare Providers: https://www.woods-mathews.com/  This test is not yet approved or cleared by the [place] FDA and  has  been authorized for detection and/or diagnosis of SARS-CoV-2 by FDA under an Emergency Use Authorization (EUA). This EUA will remain  in effect (meaning this test can be used) for the duration of the COVID-19 declaration under Se ction 564(b)(1) of the Act, 21 U.S.C. section 360bbb-3(b)(1), unless the authorization is terminated or revoked sooner.  Performed at [facility], [place] [address] [place], [place] [address]   Surgical pcr screen     Status: Abnormal   Collection Time: 05/15/21 12:49 PM   Specimen: Nasal Mucosa; Nasal Swab  Result Value Ref Range Status   MRSA, PCR POSITIVE (A) NEGATIVE Final    Comment: READ BACK AND VERIFIED BY Corine Shelter, RN AT 1425 05/15/21 BY J[facility]   Staphylococcus aureus POSITIVE (A) NEGATIVE Final    Comment:  (NOTE) The Xpert SA Assay (FDA approved for NASAL specimens in patients 55 years of age and older), is one component of a comprehensive surveillance program. It is not intended to diagnose infection nor to guide or monitor treatment. Performed at [facility], [address], [place], [place] [address]          Radiology Studies: CT Hip Right Wo Contrast  Result Date: 05/14/2021 CLINICAL DATA:  [place].  Abnormal right hip radiographs. EXAM: CT OF THE RIGHT HIP WITHOUT CONTRAST TECHNIQUE: Multidetector CT imaging of the right hip was performed according to the standard protocol. Multiplanar CT image reconstructions were also generated. COMPARISON:  Radiographs, same date. FINDINGS: As demonstrated on the radiographs there is a cortical fracture involving the intertrochanteric region of the right hip anteriorly. This could be an acute fracture or possibly a stress fracture. I do not see any definite findings for a through and through fracture. There is a healing fracture involving the inferior pubic ramus on the right side. The right hip is normally located. There are moderate degenerative changes and chondrocalcinosis. The acetabulum is intact. There is a "mass" involving the lateral aspect of the gluteus maximus muscle adjacent to the trochanteric region of the femur. This is likely a partially liquified hematoma related to trauma. Vascular calcifications are noted with a right iliac artery stent. No significant intrapelvic abnormalities are identified. - IMPRESSION: 1. Nondisplaced anterior cortical fracture involving the intertrochanteric region of the right hip. This could be an acute fracture or possibly a stress fracture. 2. Healing fracture involving the inferior pubic ramus on the right side. 3. Probable liquified hematoma involving lateral aspect of the right gluteus maximus muscle. 4. Moderate degenerative changes and chondrocalcinosis. Electronically Signed   By: Marijo Sanes M.D.    On: 05/14/2021 17:14   MR HIP RIGHT WO CONTRAST  Result Date: 05/15/2021 CLINICAL DATA:  Right hip fracture. Evaluate for extent of the intertrochanteric fracture. EXAM: MR OF THE RIGHT HIP WITHOUT CONTRAST TECHNIQUE: Multiplanar, multisequence MR imaging was performed. No intravenous contrast was administered. COMPARISON:  None. FINDINGS: Bones: 1. No periosteal reaction or bone destruction. No aggressive osseous lesion. Marrow edema in the sacral ala bilaterally consistent with sacral insufficiency fractures. Degenerative disease with disc height loss of the lower lumbar spine. Articular cartilage and labrum Articular cartilage: Partial-thickness cartilage loss of the femoral head and acetabulum bilaterally. Labrum: Grossly intact, but evaluation is limited by lack of intraarticular fluid. Joint or bursal effusion Joint effusion:  No hip joint effusion.  No SI joint effusion. Bursae:  No bursa formation. Muscles and tendons Flexors: Normal. Extensors: Normal. Abductors: Normal. Adductors: Normal. Gluteals: Severe muscle edema in the gluteal musculature most consistent  with muscle strain. 6.3 cm heterogeneous fluid collection in the right gluteus maximus muscle most consistent with an intramuscular hematoma adjacent to the greater trochanter. Hamstrings: Normal. Other findings No pelvic free fluid. No fluid collection or hematoma. No inguinal lymphadenopathy. No inguinal hernia. IMPRESSION: 1. Nondisplaced right intertrochanteric fracture extending from the greater trochanter to the lesser trochanter without angulation. 2. Bilateral nondisplaced sacral insufficiency fractures with associated marrow edema. 3. Healed right superior and inferior pubic rami fractures with minimal residual marrow edema. 4. Left intertrochanteric fracture transfixed with a intramedullary nail and interlocking screw without failure or complication. Mild residual marrow edema within the intertrochanteric region of the left hip. 5.  Severe muscle strain of the gluteal musculature with a 6.3 cm intramuscular hematoma in the right gluteus maximus muscle. 6. Partial-thickness cartilage loss of bilateral hips. Electronically Signed   By: Kathreen Devoid M.D.   On: 05/15/2021 06:45   DG Chest Portable 1 View  Result Date: 05/14/2021 CLINICAL DATA:  Recent fall with history of hip fracture EXAM: PORTABLE CHEST 1 VIEW COMPARISON:  02/02/2021 FINDINGS: Cardiac shadow is within normal limits. Postsurgical changes are noted. The lungs are well aerated bilaterally no focal infiltrate. No acute bony abnormality is seen. Skin fold is noted over the left chest. IMPRESSION: No acute abnormality noted. Electronically Signed   By: Inez Catalina M.D.   On: 05/14/2021 15:18   DG HIP OPERATIVE UNILAT W OR W/O PELVIS RIGHT  Result Date: 05/15/2021 CLINICAL DATA:  Right femoral intramedullary nail placement EXAM: OPERATIVE RIGHT HIP (WITH PELVIS IF PERFORMED) 2 VIEWS TECHNIQUE: Fluoroscopic spot image(s) were submitted for interpretation post-operatively. COMPARISON:  05/14/2021 FINDINGS: Four fluoroscopic images are obtained during the performance of the procedure and are provided for interpretation only. Intramedullary rod with proximal dynamic and distal interlocking screws traverse the intertrochanteric fracture seen previously. Alignment is anatomic. Please refer to the operative report. FLUOROSCOPY TIME:  1 minutes IMPRESSION: 1. ORIF right hip fracture as above. Electronically Signed   By: Randa Ngo M.D.   On: 05/15/2021 19:18   DG Hip Unilat W or Wo Pelvis 2-3 Views Right  Result Date: 05/14/2021 CLINICAL DATA:  Fall EXAM: DG HIP (WITH OR WITHOUT PELVIS) 2-3V RIGHT COMPARISON:  Pelvis radiographs 01/24/2021, 04/08/2021 FINDINGS: There is a fracture plane through the anterior cortex of the proximal femur seen on the cross-table lateral projection. This probably corresponds to a defect in the cortex along the greater trochanter on the frontal  projection. Femoroacetabular alignment is maintained. Intramedullary rod and screw fixation of the left femur is partially imaged. The SI joints and symphysis pubis are intact. Vascular stents and post kyphoplasty changes in the lower lumbar spine are noted. IMPRESSION: Nondisplaced fracture of the proximal right femur as above. Electronically Signed   By: Valetta Mole M.D.   On: 05/14/2021 15:19        Scheduled Meds:  Chlorhexidine Gluconate Cloth  6 each Topical Q0600   cholecalciferol  2,000 Units Oral Daily   donepezil  10 mg Oral QHS   ezetimibe  10 mg Oral Daily   feeding supplement  237 mL Oral TID BM   finasteride  5 mg Oral Daily   metoprolol tartrate  12.5 mg Oral BID   mirabegron ER  50 mg Oral Daily   mirtazapine  7.5 mg Oral QHS   mupirocin ointment  1 application Nasal BID   QUEtiapine  25 mg Oral QHS   rosuvastatin  20 mg Oral Daily   traZODone  50 mg Oral QHS   Continuous Infusions:   ceFAZolin (ANCEF) IV 200 mL/hr at 05/16/21 0503     LOS: 1 day    Time spent: 25 min    Desma Maxim, MD [facility]   If 7PM-7AM, please contact night-coverage [email or web address] Password [facility] 05/16/2021, 1:38 PM

## 2021-05-17 DIAGNOSIS — S72009A Fracture of unspecified part of neck of unspecified femur, initial encounter for closed fracture: Secondary | ICD-10-CM

## 2021-05-17 LAB — BASIC METABOLIC PANEL
Anion gap: 4 — ABNORMAL LOW (ref 5–15)
BUN: 37 mg/dL — ABNORMAL HIGH (ref 8–23)
CO2: 24 mmol/L (ref 22–32)
Calcium: 8.1 mg/dL — ABNORMAL LOW (ref 8.9–10.3)
Chloride: 109 mmol/L (ref 98–111)
Creatinine, Ser: 0.87 mg/dL (ref 0.61–1.24)
GFR, Estimated: >60 mL/min (ref >60)
Glucose, Bld: 103 mg/dL — ABNORMAL HIGH (ref 70–99)
Potassium: 3.6 mmol/L (ref 3.5–5.1)
Sodium: 137 mmol/L (ref 135–145)

## 2021-05-17 LAB — CBC
HCT: 24.1 % — ABNORMAL LOW (ref 39.0–52.0)
Hemoglobin: 7.9 g/dL — ABNORMAL LOW (ref 13.0–17.0)
MCH: 31.6 pg (ref 26.0–34.0)
MCHC: 32.8 g/dL (ref 30.0–36.0)
MCV: 96.4 fL (ref 80.0–100.0)
Platelets: 81 10*3/uL — ABNORMAL LOW (ref 150–400)
RBC: 2.5 MIL/uL — ABNORMAL LOW (ref 4.22–5.81)
RDW: 16.2 % — ABNORMAL HIGH (ref 11.5–15.5)
WBC: 7.8 10*3/uL (ref 4.0–10.5)
nRBC: 0 % (ref 0.0–0.2)

## 2021-05-17 MED ORDER — FERROUS SULFATE 325 (65 FE) MG PO TABS: 325.0000 mg | ORAL_TABLET | ORAL | 1 refills | Status: AC

## 2021-05-17 MED ORDER — ENOXAPARIN SODIUM 40 MG/0.4ML IJ SOSY: 40.0000 mg | PREFILLED_SYRINGE | INTRAMUSCULAR | 0 refills | Status: DC

## 2021-05-17 MED ORDER — METOPROLOL SUCCINATE ER 25 MG PO TB24: 25.0000 mg | ORAL_TABLET | Freq: Every day | ORAL | 1 refills | Status: AC

## 2021-05-17 NOTE — Progress Notes (Signed)
I concur with Intake

## 2021-05-17 NOTE — Consult Note (Addendum)
WOC consult requested for skin tears related to a patient fall prior to admission. These can be treated independently by the bedside nurse using the Skin care order set in [place].   This includes the following topical treatment: Apply Vaseline gauze to skin tears Q day and cover with kerlex and tape. Please re-consult if further assistance is needed.  Thank-you,  Julien Girt MSN, [place], [place], [place], [place]

## 2021-05-17 NOTE — TOC Progression Note (Signed)
Transition of Care [facility]) - Progression Note    Patient Details  Name: [name] MRN: [id number] Date of Birth: Jan 28, 1934  Transition of Care [facility]) CM/SW [place], RN Phone Number: 05/17/2021, 1:59 PM  Clinical Narrative:   Delia Heady summary, FL2 and [place] orders to [facility], EMS to be called for transport         Expected Discharge Plan and Services           Expected Discharge Date: 05/17/21                                     Social Determinants of Health (SDOH) Interventions    Readmission Risk Interventions Readmission Risk Prevention Plan 03/04/2021 12/28/2019 05/19/2019  Transportation Screening Complete Complete Complete  HRI or Home Care Consult - - Complete  Social Work Consult for [place] Planning/Counseling - - Complete  Palliative Care Screening - - Not Applicable  Medication Review Press photographer) Complete Complete Complete  PCP or Specialist appointment within 3-5 days of discharge Complete - -  [place] or Home Care Consult Complete Complete -  SW Recovery Care/Counseling Consult Complete - -  Palliative Care Screening Not Applicable - -  [place] Not Applicable Complete -  Some recent data might be hidden

## 2021-05-17 NOTE — Care Management Important Message (Signed)
Important Message  Patient Details  Name: [name] MRN: [id number] Date of Birth: 27-Oct-1933   Medicare Important Message Given:  Yes  Copy of Medicare IM left in patient room on window sill for reference.  No family in room at time of visit.  Spoke with son, [name], at [phone] to review Medicare IM.  Aware of right to appeal discharge and in agreement with current discharge plan.      Dannette Barbara 05/17/2021, 1:24 PM

## 2021-05-17 NOTE — Progress Notes (Signed)
PT Cancellation Note  Patient Details Name: [name] MRN: [id number] DOB: 11/12/1933   Cancelled Treatment:    Reason Eval/Treat Not Completed: Fatigue/lethargy limiting ability to participate Attempted to see patient two times this AM. First attempt pt was receiving nursing care and second attempt pt was sleeping. Son present who reports patient did not sleep well last night. Answered all questions from Son regarding discharge disposition. Will re-attempt this afternoon time permitting.   Andrey Campanile, SPT  Andrey Campanile 05/17/2021, 1:13 PM

## 2021-05-17 NOTE — Plan of Care (Signed)

## 2021-05-17 NOTE — NC FL2 (Addendum)
[place] LEVEL OF CARE SCREENING TOOL     IDENTIFICATION  Patient Name: [name] Birthdate: April 19, 1934 Sex: male Admission Date (Current Location): 05/14/2021  [facility] and [place] Number:  Engineering geologist and Address:  [facility], [address] [place], [place] [address]      Provider Number: [id number]  Attending Physician Name and Address:  Gwynne Edinger, MD  Relative Name and Phone Number:  [name] SOn [phone]    Current Level of Care: Hospital Recommended Level of Care: [place], Memory Care Prior Approval Number:    Date Approved/Denied:   PASRR Number:    Discharge Plan: Other (Comment)    Current Diagnoses: Patient Active Problem List   Diagnosis Date Noted   Femur fracture, right ([place]) 05/14/2021   Acute cystitis with hematuria 03/02/2021   UTI (urinary tract infection) 03/01/2021   CAD (coronary artery disease) 03/01/2021   Depression with anxiety 03/01/2021   Protein-calorie malnutrition, severe 01/25/2021   Accidental fall 01/24/2021   Closed left hip fracture, initial encounter ([place]) 01/24/2021   Closed fracture of left distal radius 01/24/2021   Dementia without behavioral disturbance ([place]) 01/24/2021   Pubic ramus fracture, left, closed, initial encounter ([place]) 01/24/2021   Hip fracture ([place]) 01/24/2021   AAA (abdominal aortic aneurysm) without rupture ([place]) 06/29/2020   LBD (Lewy body dementia) ([place]) 05/14/2020   Numbness and tingling in left hand 03/02/2020   Hematuria    Acute kidney injury superimposed on CKD ([place])    Sepsis due to gram-negative UTI ([place]) 12/26/2019   Left nephrolithiasis 12/26/2019   Hydronephrosis, left 12/26/2019   Status post abdominal aortic aneurysm (AAA) repair 04/15/2019   Do not intubate, cardiopulmonary resuscitation (CPR)-only code status 10/08/2018   Prostate cancer ([place]) 02/25/2018   Prostatic cyst 01/15/2018   Blood in stool     Abnormal feces    Stricture and stenosis of esophagus    Mild cognitive impairment 08/18/2017   Senile purpura ([place]) 04/29/2017   Normocytic anemia 04/29/2017   Frequent falls 04/29/2017   Simple chronic bronchitis ([place]) 12/17/2016   Benign localized hyperplasia of prostate with urinary obstruction 07/15/2016   Elevated PSA 07/15/2016   Incomplete emptying of bladder 07/15/2016   Urge incontinence 07/15/2016   Hypothyroidism 04/25/2016   Macular degeneration 04/25/2016   Erectile dysfunction due to arterial insufficiency    Ischemic heart disease with chronotropic incompetence    CN (constipation) 02/10/2015   DD (diverticular disease) 02/10/2015   Weakness [id number]   Lichen planus [id number]   Restless leg 02/10/2015   Circadian rhythm disorder 02/10/2015   B12 deficiency 02/10/2015   COPD (chronic obstructive pulmonary disease) ([place]) 11/14/2014   Post Inflammatory Lung Changes 11/14/2014   PAF (paroxysmal atrial fibrillation) (HCC) CHA2DS2-VASc = 4. AC = Eliquis    Insomnia 12/09/2013   OSA on CPAP    Dyspnea on exertion - -essentially resolved with BB dose reduction & wgt loss 03/29/2013   Overweight (BMI 25.0-29.9) -- Wgt back up 01/17/2013   Hx of CABG    Essential hypertension    Hyperlipidemia with target LDL less than 70    Aorto-iliac disease ([place])    BCC (basal cell carcinoma), eyelid 01/03/2013   Allergic rhinitis 08/17/2009   Adaptation reaction 05/28/2009   Acid reflux 05/28/2009   Arthritis, degenerative 05/28/2009   Abnormality of aortic arch branch 06/01/2006   Carotid artery occlusion without infarction 06/01/2006   PAD (peripheral artery disease) - bilateral common iliac  stents 12/15/2005   Atherosclerotic heart disease of artery bypass graft 09/01/1997    Orientation RESPIRATION BLADDER Height & Weight     Self, Place  Normal Continent Weight: 58.7 kg (bed weight) Height:  6' (182.9 cm)  BEHAVIORAL SYMPTOMS/MOOD NEUROLOGICAL BOWEL NUTRITION STATUS       Continent Diet (regular)  AMBULATORY STATUS COMMUNICATION OF NEEDS Skin   Limited Assist Verbally Skin abrasions, Surgical wounds                       Personal Care Assistance Level of Assistance  Bathing, Dressing Bathing Assistance: Limited assistance   Dressing Assistance: Limited assistance     Functional Limitations Info  Hearing   Hearing Info: Impaired      SPECIAL CARE FACTORS FREQUENCY  PT (By licensed PT), OT (By licensed OT)     PT Frequency: 3 times per week OT Frequency: 3 times per week            Contractures      Additional Factors Info  Code Status, Allergies Code Status Info: DNR Allergies Info: Clonazepam, Codeine, Ropinirole, Hydrocodone, Niacin And Related, Oxycodone, Tramadol           Current Medications (05/17/2021):  This is the current hospital active medication list Current Facility-Administered Medications  Medication Dose Route Frequency Provider Last Rate Last Admin   acetaminophen (TYLENOL) tablet 650 mg  650 mg Oral Q6H PRN Gwynne Edinger, MD   650 mg at 05/16/21 1350   Chlorhexidine Gluconate Cloth 2 % PADS 6 each  6 each Topical Q0600 Renee Harder, MD   6 each at 05/16/21 0502   cholecalciferol (VITAMIN D3) tablet 2,000 Units  2,000 Units Oral Daily Renee Harder, MD   2,000 Units at 05/17/21 0937   donepezil (ARICEPT) tablet 10 mg  10 mg Oral Dewain Penning, MD   10 mg at 05/16/21 2303   enoxaparin (LOVENOX) injection 40 mg  40 mg Subcutaneous Q24H Gwynne Edinger, MD   40 mg at 05/17/21 0936   ezetimibe (ZETIA) tablet 10 mg  10 mg Oral Daily Renee Harder, MD   10 mg at 05/17/21 0935   feeding supplement (ENSURE ENLIVE / ENSURE PLUS) liquid 237 mL  237 mL Oral TID BM Renee Harder, MD   237 mL at 05/17/21 1000   finasteride (PROSCAR) tablet 5 mg  5 mg Oral Daily Renee Harder, MD   5 mg at 05/17/21 0935   ketorolac (TORADOL) 15 MG/ML injection 15 mg  15 mg Intravenous Q6H PRN Gwynne Edinger, MD   15 mg at 05/16/21 1350   magic mouthwash  30 mL Oral QID PRN Renee Harder, MD       metoprolol tartrate (LOPRESSOR) tablet 12.5 mg  12.5 mg Oral BID Renee Harder, MD   12.5 mg at 05/17/21 0935   mirabegron ER (MYRBETRIQ) tablet 50 mg  50 mg Oral Daily Renee Harder, MD   50 mg at 05/17/21 0936   mirtazapine (REMERON) tablet 7.5 mg  7.5 mg Oral Dewain Penning, MD   7.5 mg at 05/16/21 2303   mupirocin ointment (BACTROBAN) 2 % 1 application  1 application Nasal BID Renee Harder, MD   1 application at [id number]   QUEtiapine (SEROQUEL) tablet 25 mg  25 mg Oral Dewain Penning, MD   25 mg at 05/16/21 2303   rosuvastatin (CRESTOR) tablet 20 mg  20 mg Oral Daily Renee Harder, MD   20  mg at 05/17/21 0935   traZODone (DESYREL) tablet 50 mg  50 mg Oral Dewain Penning, MD   50 mg at 05/16/21 2304     Discharge Medications:  TAKE these medications     acetaminophen 500 MG tablet Commonly known as: TYLENOL Take 1,000 mg by mouth in the morning and at bedtime.    benzocaine 20 % Gel Commonly known as: HURRICAINE Use as directed 1 application in the mouth or throat 4 (four) times daily.    Cholecalciferol 50 MCG (2000 UT) Tbdp Take 2,000 Units by mouth daily.    enoxaparin 40 MG/0.4ML injection Commonly known as: LOVENOX Inject 0.4 mLs (40 mg total) into the skin daily for 30 doses. Start taking on: May 18, 2021    ezetimibe 10 MG tablet Commonly known as: ZETIA TAKE 1 TABLET BY MOUTH DAILY    feeding supplement Liqd Take 237 mLs by mouth 3 (three) times daily between meals.    ferrous sulfate 325 (65 FE) MG tablet Commonly known as: FerrouSul Take 1 tablet (325 mg total) by mouth every other day.    finasteride 5 MG tablet Commonly known as: PROSCAR TAKE ONE TABLET BY MOUTH EVERY DAY    fluticasone 50 MCG/ACT nasal spray Commonly known as: FLONASE PLACE TWO SPRAYS IN BOTH NOSRTILS EVERY DAY AS NEEDED FOR  ALLERGIES    magic mouthwash Soln Take 30 mLs by mouth 4 (four) times daily.    Magnesium Oxide 500 MG Tabs Take 500 mg by mouth every evening.    metoprolol succinate 25 MG 24 hr tablet Commonly known as: Toprol XL Take 1 tablet (25 mg total) by mouth daily.    mirtazapine 7.5 MG tablet Commonly known as: REMERON Take 7.5 mg by mouth at bedtime.    Myrbetriq 50 MG Tb24 tablet Generic drug: mirabegron ER TAKE ONE TABLET BY MOUTH EVERY DAY What changed:  how much to take when to take this    QUEtiapine 25 MG tablet Commonly known as: SEROQUEL Take 25 mg by mouth at bedtime.    rosuvastatin 20 MG tablet Commonly known as: CRESTOR TAKE ONE TABLET BY MOUTH EVERY DAY    Systane 0.4-0.3 % Soln Generic drug: Polyethyl Glycol-Propyl Glycol Place 1 drop into both eyes at bedtime.   Please see discharge summary for a list of discharge medications.  Relevant Imaging Results:  Relevant Lab Results:   Additional Information ss # [id number]  [name], RN

## 2021-05-17 NOTE — Progress Notes (Signed)
D/C paperwork given to [name]. Verbalized understanding. IV Removed. Tip intact. All belongings with patient.  Son transporting patient to home place via private vehicle.

## 2021-05-17 NOTE — Plan of Care (Signed)
  Problem: Education: Goal: Knowledge of General Education information will improve Description: Including pain rating scale, medication(s)/side effects and non-pharmacologic comfort measures 05/17/2021 1421 by Zailey Audia, Debbe Mounts, RN Outcome: Completed/Met 05/17/2021 1110 by Emmalyn Hinson, Debbe Mounts, RN Outcome: Progressing   Problem: Health Behavior/Discharge Planning: Goal: Ability to manage health-related needs will improve 05/17/2021 1421 by Eshani Maestre, Debbe Mounts, RN Outcome: Completed/Met 05/17/2021 1110 by Elsie Ra, RN Outcome: Progressing   Problem: Clinical Measurements: Goal: Ability to maintain clinical measurements within normal limits will improve 05/17/2021 1421 by Tierre Netto, Debbe Mounts, RN Outcome: Completed/Met 05/17/2021 1110 by Elsie Ra, RN Outcome: Progressing Goal: Will remain free from infection 05/17/2021 1421 by Elsie Ra, RN Outcome: Completed/Met 05/17/2021 1110 by Elsie Ra, RN Outcome: Progressing Goal: Diagnostic test results will improve 05/17/2021 1421 by Elsie Ra, RN Outcome: Completed/Met 05/17/2021 1110 by Elsie Ra, RN Outcome: Progressing Goal: Respiratory complications will improve 05/17/2021 1421 by Elsie Ra, RN Outcome: Completed/Met 05/17/2021 1110 by Elsie Ra, RN Outcome: Progressing Goal: Cardiovascular complication will be avoided 05/17/2021 1421 by Elsie Ra, RN Outcome: Completed/Met 05/17/2021 1110 by Cale Bethard, Debbe Mounts, RN Outcome: Progressing   Problem: Activity: Goal: Risk for activity intolerance will decrease 05/17/2021 1421 by Yuritzi Kamp, Debbe Mounts, RN Outcome: Completed/Met 05/17/2021 1110 by Elsie Ra, RN Outcome: Progressing   Problem: Nutrition: Goal: Adequate nutrition will be maintained 05/17/2021 1421 by Elsie Ra, RN Outcome: Completed/Met 05/17/2021 1110 by Elsie Ra, RN Outcome: Progressing   Problem: Coping: Goal: Level  of anxiety will decrease 05/17/2021 1421 by Jenalee Trevizo, Debbe Mounts, RN Outcome: Completed/Met 05/17/2021 1110 by Elsie Ra, RN Outcome: Progressing   Problem: Elimination: Goal: Will not experience complications related to bowel motility 05/17/2021 1421 by Elsie Ra, RN Outcome: Completed/Met 05/17/2021 1110 by Elsie Ra, RN Outcome: Progressing Goal: Will not experience complications related to urinary retention 05/17/2021 1421 by Shannyn Jankowiak, Debbe Mounts, RN Outcome: Completed/Met 05/17/2021 1110 by Woodford Strege, Debbe Mounts, RN Outcome: Progressing   Problem: Pain Managment: Goal: General experience of comfort will improve 05/17/2021 1421 by Rush Salce, Debbe Mounts, RN Outcome: Completed/Met 05/17/2021 1110 by Elsie Ra, RN Outcome: Progressing   Problem: Safety: Goal: Ability to remain free from injury will improve 05/17/2021 1421 by Raima Geathers, Debbe Mounts, RN Outcome: Completed/Met 05/17/2021 1110 by Elsie Ra, RN Outcome: Progressing   Problem: Skin Integrity: Goal: Risk for impaired skin integrity will decrease 05/17/2021 1421 by Elsie Ra, RN Outcome: Completed/Met 05/17/2021 1110 by Boruch Manuele, Debbe Mounts, RN Outcome: Progressing
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
## 2021-05-19 NOTE — Anesthesia Postprocedure Evaluation (Signed)
Anesthesia Post Note  Patient: [name]  Procedure(s) Performed: INTRAMEDULLARY (IM) NAIL INTERTROCHANTRIC (Right: Hip)  Patient location during evaluation: PACU Anesthesia Type: General Level of consciousness: awake and alert Pain management: pain level controlled Vital Signs Assessment: post-procedure vital signs reviewed and stable Respiratory status: spontaneous breathing, nonlabored ventilation, respiratory function stable and patient connected to nasal cannula oxygen Cardiovascular status: blood pressure returned to baseline and stable Postop Assessment: no apparent nausea or vomiting Anesthetic complications: no   No notable events documented.   Last Vitals:  Vitals:   05/16/21 2033 05/17/21 1151  BP: 113/68 139/80  Pulse: 82 92  Resp: 16 13  Temp: 36.8 C 36.5 C  SpO2: 100% 91%    Last Pain:  Vitals:   05/17/21 1151  TempSrc: Axillary  PainSc:                  [name]

## 2021-05-24 ENCOUNTER — Other Ambulatory Visit: Payer: Self-pay | Admitting: Family Medicine

## 2021-05-24 DIAGNOSIS — E785 Hyperlipidemia, unspecified: Secondary | ICD-10-CM

## 2021-06-07 ENCOUNTER — Encounter (INDEPENDENT_AMBULATORY_CARE_PROVIDER_SITE_OTHER): Payer: Self-pay

## 2021-06-11 ENCOUNTER — Telehealth (INDEPENDENT_AMBULATORY_CARE_PROVIDER_SITE_OTHER): Payer: Self-pay | Admitting: Nurse Practitioner

## 2021-06-11 NOTE — Telephone Encounter (Signed)
I called and discussed Mr. Iten condition with his son as noted in his 57 message.  Based on the extremely limited mobility and dementia for the patient it is reasonable to hold off on performing his noninvasive studies at this time.  The patient most recently has had stable carotid and ABI ultrasounds. Is not unreasonable to move the studies until the time with patient mobility is much better.  There is a very low likelihood of worsening carotid disease and currently the patient has no worsening symptoms or PAD and again studies were stable.  We will move up his appointment for 6 months or have the patient presents sooner if necessary.

## 2021-06-12 ENCOUNTER — Emergency Department
Admission: EM | Admit: 2021-06-12 | Discharge: 2021-06-12 | Disposition: A | Discharge status: Home / Self Care | Payer: Medicare Other | Attending: Emergency Medicine | Admitting: Emergency Medicine

## 2021-06-12 ENCOUNTER — Other Ambulatory Visit: Payer: Self-pay

## 2021-06-12 ENCOUNTER — Emergency Department: Payer: Medicare Other

## 2021-06-12 DIAGNOSIS — W19XXXA Unspecified fall, initial encounter: Secondary | ICD-10-CM | POA: Diagnosis not present

## 2021-06-12 DIAGNOSIS — S8011XA Contusion of right lower leg, initial encounter: Secondary | ICD-10-CM | POA: Diagnosis not present

## 2021-06-12 DIAGNOSIS — Z79899 Other long term (current) drug therapy: Secondary | ICD-10-CM | POA: Diagnosis not present

## 2021-06-12 DIAGNOSIS — Z85038 Personal history of other malignant neoplasm of large intestine: Secondary | ICD-10-CM | POA: Diagnosis not present

## 2021-06-12 DIAGNOSIS — S8012XA Contusion of left lower leg, initial encounter: Secondary | ICD-10-CM | POA: Diagnosis not present

## 2021-06-12 DIAGNOSIS — Z87891 Personal history of nicotine dependence: Secondary | ICD-10-CM | POA: Diagnosis not present

## 2021-06-12 DIAGNOSIS — I4891 Unspecified atrial fibrillation: Secondary | ICD-10-CM | POA: Diagnosis not present

## 2021-06-12 DIAGNOSIS — Z7901 Long term (current) use of anticoagulants: Secondary | ICD-10-CM | POA: Insufficient documentation

## 2021-06-12 DIAGNOSIS — J449 Chronic obstructive pulmonary disease, unspecified: Secondary | ICD-10-CM | POA: Diagnosis not present

## 2021-06-12 DIAGNOSIS — E039 Hypothyroidism, unspecified: Secondary | ICD-10-CM | POA: Insufficient documentation

## 2021-06-12 DIAGNOSIS — I129 Hypertensive chronic kidney disease with stage 1 through stage 4 chronic kidney disease, or unspecified chronic kidney disease: Secondary | ICD-10-CM | POA: Insufficient documentation

## 2021-06-12 DIAGNOSIS — Z8546 Personal history of malignant neoplasm of prostate: Secondary | ICD-10-CM | POA: Diagnosis not present

## 2021-06-12 DIAGNOSIS — S0990XA Unspecified injury of head, initial encounter: Secondary | ICD-10-CM

## 2021-06-12 DIAGNOSIS — N189 Chronic kidney disease, unspecified: Secondary | ICD-10-CM | POA: Diagnosis not present

## 2021-06-12 DIAGNOSIS — G3183 Dementia with Lewy bodies: Secondary | ICD-10-CM | POA: Insufficient documentation

## 2021-06-12 DIAGNOSIS — I2510 Atherosclerotic heart disease of native coronary artery without angina pectoris: Secondary | ICD-10-CM | POA: Diagnosis not present

## 2021-06-12 DIAGNOSIS — Z85828 Personal history of other malignant neoplasm of skin: Secondary | ICD-10-CM | POA: Insufficient documentation

## 2021-06-12 DIAGNOSIS — S0101XA Laceration without foreign body of scalp, initial encounter: Secondary | ICD-10-CM | POA: Insufficient documentation

## 2021-06-12 DIAGNOSIS — Z85840 Personal history of malignant neoplasm of eye: Secondary | ICD-10-CM | POA: Insufficient documentation

## 2021-06-12 LAB — BASIC METABOLIC PANEL
Anion gap: 8 (ref 5–15)
BUN: 21 mg/dL (ref 8–23)
CO2: 27 mmol/L (ref 22–32)
Calcium: 9.2 mg/dL (ref 8.9–10.3)
Chloride: 105 mmol/L (ref 98–111)
Creatinine, Ser: 0.87 mg/dL (ref 0.61–1.24)
GFR, Estimated: >60 mL/min (ref >60)
Glucose, Bld: 78 mg/dL (ref 70–99)
Potassium: 4.3 mmol/L (ref 3.5–5.1)
Sodium: 140 mmol/L (ref 135–145)

## 2021-06-12 LAB — CBC
HCT: 29.9 % — ABNORMAL LOW (ref 39.0–52.0)
Hemoglobin: 9.9 g/dL — ABNORMAL LOW (ref 13.0–17.0)
MCH: 33.8 pg (ref 26.0–34.0)
MCHC: 33.1 g/dL (ref 30.0–36.0)
MCV: 102 fL — ABNORMAL HIGH (ref 80.0–100.0)
Platelets: 156 10*3/uL (ref 150–400)
RBC: 2.93 MIL/uL — ABNORMAL LOW (ref 4.22–5.81)
RDW: 17.2 % — ABNORMAL HIGH (ref 11.5–15.5)
WBC: 7.7 10*3/uL (ref 4.0–10.5)
nRBC: 0 % (ref 0.0–0.2)

## 2021-06-12 NOTE — ED Triage Notes (Signed)
Pt to ED [facility] [place] [facility] for fall of unknown nature. Laceration noted to back of head. Bleeding controlled at this time.  Hx dementia. Pt not answering questions appropriately.

## 2021-06-12 NOTE — ED Provider Notes (Signed)
[facility] Emergency Department Provider Note   ____________________________________________   Event Date/Time   First MD Initiated Contact with Patient 06/12/21 1509     (approximate)  I have reviewed the triage vital signs and the nursing notes.   HISTORY  Chief Complaint Fall    HPI [name] is a 87 y.o. male history of Lewy body dementia frequent falls, COPD cancer dementia recent hip fracture  Patient is here with his daughter who also reports that she is healthcare power of attorney.  Both are very pleasant.  Daughter reports that at his memory care he often tries to get out of bed without assistance they think he got out of bed fell back and hit the back of his head with what appears to be some bleeding possibly a small cut  He has not complained of anything or been any recent ill health.  He does have poor appetite and eats barely about 1 meal a day but that is been typical for a while  Daughter has not noticed any other injuries.  Patient is able to walk and uses arms and legs well.  He does reside in a memory care facility and does need frequent assistance getting up and down from bed and has pretty severe dementia  Patient himself reports no pain or discomfort.  However when asked specifically if he is having neck pain he does report the back of his neck feels slightly sore.  Denies any difficulty breathing chest pain arm or leg pain.  Demonstrates good range of motion of his extremities. Past Medical History:  Diagnosis Date   AAA (abdominal aortic aneurysm) 05/2019   Relativley Rapid progression from June-Aug 2020 (4.6 - 5.4 cm): s/p EVAR (Dr. Lucky Cowboy [facility]): EVAR - 23 mm prox, 12 cm distal & x 18 cm length Gore Excluder Endoprosthesis Main body (via RFA distal to lowest Renal A); 82m x 14 cm L Contralateral Limb for L Iliac - extended with 8 mm x 6cm LifeStar stent (post-dilated with 7 mm DEB).  Additional R Iliac PTA not required.     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 [place] 09/15/2017   09-15-2017 DIAGNOSIS:  A. COLON POLYP X 2, ASCENDING; COLD SNARE:  - TUBULAR ADENOMA.  - NEGATIVE FOR HIGH-GRADE DYSPLASIA AND MALIGNANCY.  - NO ADDITIONAL TISSUE IDENTIFIED; FECAL MATERIAL PRESENT.   B. COLON POLYP X 2, DESCENDING; COLD SNARE:  - ADENOCARCINOMA ARISING IN A DYSPLASTIC SESSILE SERRATED ADENOMA, WITH  LYMPHOVASCULAR INVASION, SEE COMMENT.  - TUBULAR ADENOMA, 2 FRAGMENTS, NEGATIVE FO   Arthritis    Basal cell carcinoma of eyelid 01/03/2013   2019 - R side of Nose   Benign neoplasm of ascending colon    Benign neoplasm of descending colon    Bilateral [place]ac artery stenosis [place]   Bilateral Iliac A stenting; extension of EVAR limbs into both Iliacs -- additional stent placed in L Iliac.   Cancer ([facility]    Skin cancer   Colon polyps    Complication of anesthesia    severe confusion agitation requiring hospital admission x 4 days   Compression fracture of fourth lumbar vertebra ([place] 04/2019   - s/p Kyphoplasty; T12, L3-5 (*[facility])   COPD (chronic obstructive pulmonary disease) (HCC)    Centrilobular Emphysema   Coronary artery disease involving native coronary artery without angina pectoris 1998   - s/p CABG, occluded SVG-D1; patent LIMA-LAD, SVG-OM, SVG- RCA   Dementia ([place]    memory loss - started noting  late 2019   Falls frequently    broke foot   Fracture 2021   left foot   GERD (gastroesophageal reflux disease)    Hemorrhoid 02/10/2015   History of hiatal hernia    History of kidney stones    HOH (hard of hearing)    Hypertension    Lewy body dementia ([place]) 04/04/2020   Macular degeneration disease    Osteoporosis    PAF (paroxysmal atrial fibrillation) (HCC)    on Eliquis for OAC - Rate Control    Prostate cancer (HCC)    Prostate disorder    Sciatic pain    Chronic   Sleep apnea    cpap   Temporary cerebral vascular dysfunction 02/10/2015   Had negative work-up.  July, 2012.  No medication changes, had  forgot them that day, got dehydrated.    Urinary incontinence     Patient Active Problem List   Diagnosis Date Noted   Femur fracture, right ([place]) 05/14/2021   Acute cystitis with hematuria 03/02/2021   UTI (urinary tract infection) 03/01/2021   CAD (coronary artery disease) 03/01/2021   Depression with anxiety 03/01/2021   Protein-calorie malnutrition, severe 01/25/2021   Accidental fall 01/24/2021   Closed left hip fracture, initial encounter ([place]) 01/24/2021   Closed fracture of left distal radius 01/24/2021   Dementia without behavioral disturbance ([place]) 01/24/2021   Pubic ramus fracture, left, closed, initial encounter ([place]) 01/24/2021   Hip fracture ([place]) 01/24/2021   AAA (abdominal aortic aneurysm) without rupture 06/29/2020   LBD (Lewy body dementia) ([place]) 05/14/2020   Numbness and tingling in left hand 03/02/2020   Hematuria    Acute kidney injury superimposed on CKD ([place])    Sepsis due to gram-negative UTI ([place]) 12/26/2019   Left nephrolithiasis 12/26/2019   Hydronephrosis, left 12/26/2019   Status post abdominal aortic aneurysm (AAA) repair 04/15/2019   Do not intubate, cardiopulmonary resuscitation (CPR)-only code status 10/08/2018   Prostate cancer ([place]) 02/25/2018   Prostatic cyst 01/15/2018   Blood in stool    Abnormal feces    Stricture and stenosis of esophagus    Mild cognitive impairment 08/18/2017   Senile purpura ([place]) 04/29/2017   Normocytic anemia 04/29/2017   Frequent falls 04/29/2017   Simple chronic bronchitis ([place]) 12/17/2016   Benign localized hyperplasia of prostate with urinary obstruction 07/15/2016   Elevated PSA 07/15/2016   Incomplete emptying of bladder 07/15/2016   Urge incontinence 07/15/2016   Hypothyroidism 04/25/2016   Macular degeneration 04/25/2016   Erectile dysfunction due to arterial insufficiency    Ischemic heart disease with chronotropic incompetence    CN (constipation) 02/10/2015   DD (diverticular disease) 02/10/2015    Weakness 24/40/1027   Lichen planus 25/36/6440   Restless leg 02/10/2015   Circadian rhythm disorder 02/10/2015   B12 deficiency 02/10/2015   COPD (chronic obstructive pulmonary disease) ([place]) 11/14/2014   Post Inflammatory Lung Changes 11/14/2014   PAF (paroxysmal atrial fibrillation) (HCC) CHA2DS2-VASc = 4. AC = Eliquis    Insomnia 12/09/2013   OSA on CPAP    Dyspnea on exertion - -essentially resolved with BB dose reduction & wgt loss 03/29/2013   Overweight (BMI 25.0-29.9) -- Wgt back up 01/17/2013   Hx of CABG    Essential hypertension    Hyperlipidemia with target LDL less than 70    Aorto-iliac disease ([place])    BCC (basal cell carcinoma), eyelid 01/03/2013   Allergic rhinitis 08/17/2009   Adaptation reaction 05/28/2009   Acid reflux 05/28/2009  Arthritis, degenerative 05/28/2009   Abnormality of aortic arch branch 06/01/2006   Carotid artery occlusion without infarction 06/01/2006   PAD (peripheral artery disease) - bilateral common iliac stents 12/15/2005   Atherosclerotic heart disease of artery bypass graft 09/01/1997    Past Surgical History:  Procedure Laterality Date   ABDOMINAL AORTIC ENDOVASCULAR STENT GRAFT Bilateral 05/18/2019   Procedure: ABDOMINAL AORTIC ENDOVASCULAR STENT GRAFT;  Surgeon: Algernon Huxley, MD;  Location: [facility] ORS;  Service: Vascular;  Laterality: Bilateral;   ANKLE SURGERY Left    ORIF    Arch aortogram and carotid aortogram  06/02/2006   Dr Oneida Alar did surgery   BASAL CELL CARCINOMA EXCISION  04/2016   Dermatology   CARDIAC CATHETERIZATION  01/30/1998    (Dr. Linard Millers): Native LAD & mRCA CTO. 90% D1. Mod Cx. SVG-D1 CTO. SVG-RCA & SVG-OM along with LIMA-LAD patent;  LV NORMAL Fxn   CAROTID ENDARTERECTOMY Left Oct. 29, 2007   Dr. Oneida Alar:   CATARACT EXTRACTION W/PHACO Right 03/05/2017   Procedure: CATARACT EXTRACTION PHACO AND INTRAOCULAR LENS PLACEMENT ([place]);  Surgeon: Leandrew Koyanagi, MD;  Location: [facility] ORS;  Service: Ophthalmology;   Laterality: Right;  [place] 00:58.5 AP% 10.6 CDE 6.20 Fluid Pack lot # [id number]   CATARACT EXTRACTION W/PHACO Left 04/15/2017   Procedure: CATARACT EXTRACTION PHACO AND INTRAOCULAR LENS PLACEMENT ([place])  Left;  Surgeon: Leandrew Koyanagi, MD;  Location: [place];  Service: Ophthalmology;  Laterality: Left;   COLONOSCOPY WITH PROPOFOL N/A 09/15/2017   Procedure: COLONOSCOPY WITH PROPOFOL;  Surgeon: Lucilla Lame, MD;  Location: [facility] ENDOSCOPY;  Service: Endoscopy;  Laterality: N/A;   COLONOSCOPY WITH PROPOFOL N/A 10/26/2018   Procedure: COLONOSCOPY WITH PROPOFOL;  Surgeon: Lucilla Lame, MD;  Location: [facility] ENDOSCOPY;  Service: Endoscopy;  Laterality: N/A;   CORONARY ARTERY BYPASS GRAFT  1998   LIMA-LAD, SVG-OM, SVG-RPDA, SVG-DIAG   CPET / MET  12/2012   Mild chronotropic incompetence - read 82% of predicted; also reduced effort; peak VO2 15.7 / 75% (did not reach Max effort) -- suggested ischemic response in last 1.5 minutes of exercise. Normal pulmonary function on PFTs but poor response to   CYSTOSCOPY W/ RETROGRADES Left 01/13/2020   Procedure: CYSTOSCOPY WITH RETROGRADE PYELOGRAM;  Surgeon: Billey Co, MD;  Location: [facility] ORS;  Service: Urology;  Laterality: Left;   CYSTOSCOPY WITH STENT PLACEMENT Left 12/26/2019   Procedure: CYSTOSCOPY WITH STENT PLACEMENT;  Surgeon: Billey Co, MD;  Location: [facility] ORS;  Service: Urology;  Laterality: Left;   CYSTOSCOPY/URETEROSCOPY/HOLMIUM LASER/STENT PLACEMENT Left 01/13/2020   Procedure: CYSTOSCOPY/URETEROSCOPY/HOLMIUM LASER/STENT EXCHANGE;  Surgeon: Billey Co, MD;  Location: [facility] OR[facility];  Service: Urology;  Laterality: Left;   ENDOVASCULAR REPAIR/STENT GRAFT N/A 05/18/2019   Procedure: ENDOVASCULAR REPAIR/STENT GRAFT;  Surgeon: Algernon Huxley, MD;  Location: [facility] INVASIVE CV LAB;; EVAR - 23 mm prox, 12 cm distal & x 18 cm length Gore Excluder Endoprosthesis Main body (via RFA distal to lowest Renal A); 34m x 14 cm L Contralateral Limb for L  Iliac - extended with 8 mm x 6cm LifeStar stent; no additional R Iliac PTA needed.    ESOPHAGOGASTRODUODENOSCOPY (EGD) WITH PROPOFOL N/A 09/15/2017   Procedure: ESOPHAGOGASTRODUODENOSCOPY (EGD) WITH PROPOFOL;  Surgeon: WLucilla Lame MD;  [facility];  Service: Endoscopy;  Laterality: N/A;   FRACTURE SURGERY Right 03/19/2015   wrist   FRACTURE SURGERY Left    HIATAL HERNIA REPAIR     ILIAC ARTERY STENT  12/15/2005   PTA and direct stenting rgt  and lft common iliac arteries   INCISION AND DRAINAGE OF WOUND Left 01/29/2021   Procedure: IRRIGATION AND DEBRIDEMENT Left Wrist and Removal of Plate;  Surgeon: Thornton Park, MD;  Location: [facility] ORS;  Service: Orthopedics;  Laterality: Left;   INTRAMEDULLARY (IM) NAIL INTERTROCHANTERIC Left 01/29/2021   Procedure: INTRAMEDULLARY (IM) NAIL INTERTROCHANTRIC;  Surgeon: Thornton Park, MD;  Location: [facility];  Service: Orthopedics;  Laterality: Left;   INTRAMEDULLARY (IM) NAIL INTERTROCHANTERIC Right 05/15/2021   Procedure: INTRAMEDULLARY (IM) NAIL INTERTROCHANTRIC;  Surgeon: Renee Harder, MD;  Location: [facility];  Service: Orthopedics;  Laterality: Right;   KYPHOPLASTY N/A 04/05/2019   Procedure: KYPHOPLASTY T12, L3, L4 L5;  Surgeon: Hessie Knows, MD;  Location: [facility];  Service: Orthopedics;  Laterality: N/A;   NM GATED MYOVIEW ([place] HX)  05/'19; 8/'20   a) [facility]: Low Risk - no ischemia or infarct.  EF > 65%. no RWMA;; b) [facility]): Normal wall motion.  No ischemia or infarction.   OPEN REDUCTION INTERNAL FIXATION (ORIF) DISTAL RADIAL FRACTURE Left 01/13/2019   Procedure: OPEN REDUCTION INTERNAL FIXATION (ORIF) DISTAL  RADIAL FRACTURE - LEFT - SLEEP APNEA;  Surgeon: Hessie Knows, MD;  Location: [facility];  Service: Orthopedics;  Laterality: Left;   OPEN REDUCTION INTERNAL FIXATION (ORIF) DISTAL RADIAL FRACTURE Left 01/29/2021   Procedure: OPEN REDUCTION INTERNAL FIXATION (ORIF) DISTAL RADIAL FRACTURE;  Surgeon: Thornton Park,  MD;  Location: [facility];  Service: Orthopedics;  Laterality: Left;   ORIF ELBOW FRACTURE Right 01/01/2017   Procedure: OPEN REDUCTION INTERNAL FIXATION (ORIF) ELBOW/OLECRANON FRACTURE;  Surgeon: Hessie Knows, MD;  Location: [facility];  Service: Orthopedics;  Laterality: Right;   ORIF WRIST FRACTURE Right 03/19/2015   Procedure: OPEN REDUCTION INTERNAL FIXATION (ORIF) WRIST FRACTURE;  Surgeon: Hessie Knows, MD;  Location: [facility];  Service: Orthopedics;  Laterality: Right;   SHOULDER ARTHROSCOPY Right    THORACIC AORTA - CAROTID ANGIOGRAM  October 2007   Dr. 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 Dilated left atrium.   TRANSTHORACIC ECHOCARDIOGRAM  04/2019   [facility] April 20, 2019): EF 55-60 %.  Mild LVH.  Relatively normal valves.    Prior to Admission medications   Medication Sig Start Date End Date Taking? Authorizing Provider  acetaminophen (TYLENOL) 500 MG tablet Take 1,000 mg by mouth in the morning and at bedtime.   Yes [provider]  benzocaine (HURRICAINE) 20 % GEL Use as directed 1 application in the mouth or throat 4 (four) times daily.   Yes [provider]  Cholecalciferol 50 MCG (2000 UT) TBDP Take 2,000 Units by mouth daily.   Yes [provider]  enoxaparin (LOVENOX) 40 MG/0.4ML injection Inject 0.4 mLs (40 mg total) into the skin daily for 30 doses. 05/18/21 06/17/21 Yes Wouk, Ailene Rud, MD  ferrous sulfate (FERROUSUL) 325 (65 FE) MG tablet Take 1 tablet (325 mg total) by mouth every other day. 05/17/21  Yes Wouk, Ailene Rud, MD  finasteride (PROSCAR) 5 MG tablet TAKE ONE TABLET BY MOUTH EVERY DAY 12/21/20  Yes Stoioff, Ronda Fairly, MD  Magnesium Oxide 500 MG TABS Take 500 mg by mouth every evening.   Yes [provider]  metoprolol succinate (TOPROL XL) 25 MG 24 hr tablet  Take 1 tablet (25 mg total) by mouth daily. Patient taking differently: Take 12.5 mg by mouth daily. 05/17/21  Yes Wouk,  Ailene Rud, MD  mirtazapine (REMERON) 7.5 MG tablet Take 7.5 mg by mouth at bedtime. 12/26/20  Yes [provider]  MYRBETRIQ 50 MG TB24 tablet TAKE ONE TABLET BY MOUTH EVERY DAY Patient taking differently: Take 50 mg by mouth at bedtime. 12/21/20  Yes Stoioff, Ronda Fairly, MD  Polyethyl Glycol-Propyl Glycol (SYSTANE) 0.4-0.3 % SOLN Place 1 drop into both eyes at bedtime.   Yes [provider]  QUEtiapine (SEROQUEL) 25 MG tablet Take 25 mg by mouth at bedtime.   Yes [provider]  rosuvastatin (CRESTOR) 20 MG tablet TAKE ONE TABLET BY MOUTH EVERY DAY 05/24/21  Yes [name], MD  ezetimibe (ZETIA) 10 MG tablet TAKE 1 TABLET BY MOUTH DAILY Patient not taking: Reported on 06/12/2021 12/21/20   [name], MD  feeding supplement (ENSURE ENLIVE / ENSURE PLUS) LIQD Take 237 mLs by mouth 3 (three) times daily between meals. 03/05/21   Nicole Kindred A, DO  fluticasone (FLONASE) 50 MCG/ACT nasal spray PLACE TWO SPRAYS IN BOTH NOSRTILS EVERY DAY AS NEEDED FOR ALLERGIES Patient not taking: No sig reported 08/07/20   [name], MD  magic mouthwash SOLN Take 30 mLs by mouth 4 (four) times daily.    [provider]    Allergies Clonazepam, Codeine, Ropinirole, Hydrocodone, Niacin and related, Oxycodone, and Tramadol  Family History  Problem Relation Age of Onset   Ulcers Mother        Peptic   Dementia Mother    Alcohol abuse Father     Social History Social History   Tobacco Use   Smoking status: Former    Types: Cigarettes    Quit date: 08/04/1997    Years since quitting: 23.8   Smokeless tobacco: Never  Vaping Use   Vaping Use: Never used  Substance Use Topics   Alcohol use: No   Drug use: No    Review of Systems Constitutional: No fever/chills or recent illness other than his hip fracture Eyes: No visual  changes. ENT: No sore throat.  Reports slight pain in the back of his neck Cardiovascular: Denies chest pain. Respiratory: Denies shortness of breath. Gastrointestinal: No abdominal pain.  Poor appetite Genitourinary: Negative for dysuria. Musculoskeletal: Negative for back pain. Skin: Negative for rash. Neurological: Negative for headaches, areas of focal weakness or numbness.    ____________________________________________   PHYSICAL EXAM:  VITAL SIGNS: ED Triage Vitals  Enc Vitals Group     BP 06/12/21 1347 120/62     Pulse Rate 06/12/21 1347 77     Resp 06/12/21 1347 18     Temp 06/12/21 1347 98.1 F (36.7 C)     Temp Source 06/12/21 1347 Oral     SpO2 06/12/21 1347 96 %     Weight 06/12/21 1348 130 lb (59 kg)     Height 06/12/21 1348 6' (1.829 m)     Head Circumference --      Peak Flow --      Pain Score --      Pain Loc --      Pain Edu? --      Excl. in [place]? --     Constitutional: Alert and oriented. Well appearing and in no acute distress.  Both he and his daughter very pleasant. Eyes: Conjunctivae are normal. Head: Atraumatic except after washing and cleansing the small approximately 1 cm very superficial linear laceration is noted over the left posterior scalp without foreign body or deep injury.  No bleeding.  Edges are somewhat ragged. Nose: No congestion/rhinnorhea. Mouth/Throat: Mucous membranes are moist. Neck: No stridor.  He demonstrates good range of motion of the neck.  He reports minimal discomfort over the mid posterior cervical region Cardiovascular: Normal rate, regular rhythm. Grossly normal heart sounds.  Good peripheral circulation. Respiratory: Normal respiratory effort.  No retractions. Lungs CTAB. Gastrointestinal: Soft and nontender. No distention. Musculoskeletal: No lower extremity tenderness nor edema.  Moves all extremities well. Neurologic:  Normal speech and language. No gross focal neurologic deficits are appreciated.  Skin:  Skin  is warm, dry and intact. No rash noted.  He does have multiple bruises noted over his shins bilaterally daughter reports that is chronic from frequently bumping into things.  Thin skin. Psychiatric: Mood and affect are normal. Speech and behavior are normal.  ____________________________________________   LABS (all labs ordered are listed, but only abnormal results are displayed)  Labs Reviewed  CBC - Abnormal; Notable for the following components:      Result Value   RBC 2.93 (*)    Hemoglobin 9.9 (*)    HCT 29.9 (*)    MCV 102.0 (*)    RDW 17.2 (*)    All other components within normal limits  BASIC METABOLIC PANEL  URINALYSIS, COMPLETE (UACMP) WITH MICROSCOPIC   ____________________________________________  EKG  Reviewed inter by me at 1400 Heart rate 75 QRS 110 QTc 440 Right bundle branch block no evidence of acute ischemia ____________________________________________  RADIOLOGY  CT HEAD WO CONTRAST  Result Date: 06/12/2021 CLINICAL DATA:  Head trauma, fall EXAM: CT HEAD WITHOUT CONTRAST CT CERVICAL SPINE WITHOUT CONTRAST TECHNIQUE: Multidetector CT imaging of the head and cervical spine was performed following the standard protocol without intravenous contrast. Multiplanar CT image reconstructions of the cervical spine were also generated. COMPARISON:  01/24/2021 CT head and cervical spine FINDINGS: CT HEAD FINDINGS Brain: No evidence of acute infarction, hemorrhage, hydrocephalus, extra-axial collection or mass lesion/mass effect. Diffuse cerebral atrophy. Periventricular white matter changes, likely the sequela of chronic small vessel ischemic disease. Remote right basal ganglia lacunar infarct. Vascular: No hyperdense vessel. Atherosclerotic calcifications in the intracranial carotid and vertebral arteries. Skull: Normal. Negative for fracture or focal lesion. No large scalp laceration or hematoma. Possible mild stranding in the soft tissues overlying left parietal scalp.  Sinuses/Orbits: No acute finding. Status post bilateral lens replacements. Other: Trace fluid in right mastoid air cells. CT CERVICAL SPINE FINDINGS Alignment: Normal. Skull base and vertebrae: No acute fracture. No primary bone lesion or focal pathologic process. Soft tissues and spinal canal: No prevertebral fluid or swelling. No visible canal hematoma. Disc levels: Disc height loss, worst at C6-C7. Multilevel facet arthropathy, which causes severe neural foraminal narrowing on the right at C3-C4 and C5-C6. Upper chest: Biapical pleuroparenchymal scarring. No focal pulmonary opacity. Other: None IMPRESSION: 1. No acute intracranial process. 2. No acute fracture or static listhesis in the cervical spine. Electronically Signed   By: Merilyn Baba M.D.   On: 06/12/2021 15:56   CT Cervical Spine Wo Contrast  Result Date: 06/12/2021 CLINICAL DATA:  Head trauma, fall EXAM: CT HEAD WITHOUT CONTRAST CT CERVICAL SPINE WITHOUT CONTRAST TECHNIQUE: Multidetector CT imaging of the head and cervical spine was performed following the standard protocol without intravenous contrast. Multiplanar CT image reconstructions of the cervical spine were also generated. COMPARISON:  01/24/2021 CT head and cervical spine FINDINGS: CT HEAD FINDINGS Brain: No evidence of acute infarction, hemorrhage, hydrocephalus, extra-axial collection or mass lesion/mass effect. Diffuse cerebral atrophy. Periventricular white matter  changes, likely the sequela of chronic small vessel ischemic disease. Remote right basal ganglia lacunar infarct. Vascular: No hyperdense vessel. Atherosclerotic calcifications in the intracranial carotid and vertebral arteries. Skull: Normal. Negative for fracture or focal lesion. No large scalp laceration or hematoma. Possible mild stranding in the soft tissues overlying left parietal scalp. Sinuses/Orbits: No acute finding. Status post bilateral lens replacements. Other: Trace fluid in right mastoid air cells. CT  CERVICAL SPINE FINDINGS Alignment: Normal. Skull base and vertebrae: No acute fracture. No primary bone lesion or focal pathologic process. Soft tissues and spinal canal: No prevertebral fluid or swelling. No visible canal hematoma. Disc levels: Disc height loss, worst at C6-C7. Multilevel facet arthropathy, which causes severe neural foraminal narrowing on the right at C3-C4 and C5-C6. Upper chest: Biapical pleuroparenchymal scarring. No focal pulmonary opacity. Other: None IMPRESSION: 1. No acute intracranial process. 2. No acute fracture or static listhesis in the cervical spine. Electronically Signed   By: Merilyn Baba M.D.   On: 06/12/2021 15:56     Imaging reviewed CT of the head and cervical spine negative for acute findings.  Personally viewed images ____________________________________________   PROCEDURES  Procedure(s) performed:  Laceration repair  .Marland KitchenLaceration Repair  Date/Time: 06/12/2021 4:41 PM Performed by: Delman Kitten, MD Authorized by: Delman Kitten, MD   Consent:    Consent obtained:  Verbal (daughter provides consent)   Consent given by:  Healthcare agent   Risks discussed:  Infection and retained foreign body   Alternatives discussed:  No treatment (sutures (though in this case recomend glue)) Universal protocol:    Procedure explained and questions answered to patient or proxy's satisfaction: yes     Relevant documents present and verified: yes     Imaging studies available: yes     Patient identity confirmed:  Arm band Anesthesia:    Anesthesia method:  None Laceration details:    Location:  Scalp   Length (cm):  1   Depth (mm):  3 Pre-procedure details:    Patient was prepped and draped in usual sterile fashion: cleansed, cleaned with soap water scrub. Exploration:    Hemostasis achieved with:  Direct pressure   Contaminated: no   Treatment:    Area cleansed with:  Saline and soap and water   Amount of cleaning:  Standard   Irrigation solution:  Sterile  saline   Visualized foreign bodies/material removed: no     Debridement:  None   Undermining:  None Skin repair:    Repair method:  Tissue adhesive Approximation:    Approximation:  Close Repair type:    Repair type:  Simple Post-procedure details:    Dressing:  Sterile dressing   Procedure completion:  Tolerated well, no immediate complications  Critical Care performed: No  ____________________________________________   INITIAL IMPRESSION / ASSESSMENT AND PLAN / ED COURSE  Pertinent labs & imaging results that were available during my care of the patient were reviewed by me and considered in my medical decision making (see chart for details).   Patient presents for a fall.  Does have dementia and falls frequently.  This was unwitnessed.  He is alert to his baseline in no distress does not demonstrate any obvious evidence of injury musculoskeletal injury full range of motion of major joints.    Patient's laboratory testing very reassuring.  He is resting comfortably.  No concerns or issues around gluing his small laceration.  He tolerated procedure without any concern.  Was cleansed irrigated prior with soap and water.  Patient has baseline.  Labs reassuring imaging reassuring.  History of frequent falls.  Discussed with his daughter and he will be returning to memory care.  Reviewed recommendations around return precautions and how to appropriately provide care for his scalp laceration has not been glued.    ____________________________________________   FINAL CLINICAL IMPRESSION(S) / ED DIAGNOSES  Final diagnoses:  Minor head injury, initial encounter  Scalp laceration, initial encounter        Note:  This document was prepared using Dragon voice recognition software and may include unintentional dictation errors       Delman Kitten, MD 06/12/21 1643

## 2021-06-18 ENCOUNTER — Encounter (INDEPENDENT_AMBULATORY_CARE_PROVIDER_SITE_OTHER): Payer: Medicare Other

## 2021-06-18 ENCOUNTER — Ambulatory Visit (INDEPENDENT_AMBULATORY_CARE_PROVIDER_SITE_OTHER): Payer: Medicare Other | Admitting: Nurse Practitioner

## 2021-06-18 ENCOUNTER — Other Ambulatory Visit (INDEPENDENT_AMBULATORY_CARE_PROVIDER_SITE_OTHER): Payer: Medicare Other

## 2021-06-26 ENCOUNTER — Other Ambulatory Visit: Payer: Self-pay | Admitting: Family Medicine

## 2021-06-26 DIAGNOSIS — J301 Allergic rhinitis due to pollen: Secondary | ICD-10-CM

## 2021-06-27 NOTE — Telephone Encounter (Signed)
Requested medications are due for refill today.  yes  Requested medications are on the active medications list.  yes  Last refill. 08/07/2020  Future visit scheduled.   no  Notes to clinic.  PCP listed is Monico Blitz.

## 2021-06-28 ENCOUNTER — Telehealth: Payer: Self-pay | Admitting: *Deleted

## 2021-06-28 ENCOUNTER — Encounter: Payer: Self-pay | Admitting: Cardiology

## 2021-06-28 ENCOUNTER — Telehealth (INDEPENDENT_AMBULATORY_CARE_PROVIDER_SITE_OTHER): Payer: Medicare Other | Admitting: Cardiology

## 2021-06-28 DIAGNOSIS — I480 Paroxysmal atrial fibrillation: Secondary | ICD-10-CM | POA: Diagnosis not present

## 2021-06-28 DIAGNOSIS — I2510 Atherosclerotic heart disease of native coronary artery without angina pectoris: Secondary | ICD-10-CM | POA: Diagnosis not present

## 2021-06-28 DIAGNOSIS — E785 Hyperlipidemia, unspecified: Secondary | ICD-10-CM

## 2021-06-28 DIAGNOSIS — I10 Essential (primary) hypertension: Secondary | ICD-10-CM

## 2021-06-28 DIAGNOSIS — I739 Peripheral vascular disease, unspecified: Secondary | ICD-10-CM

## 2021-06-28 DIAGNOSIS — E43 Unspecified severe protein-calorie malnutrition: Secondary | ICD-10-CM

## 2021-06-28 MED ORDER — ASPIRIN EC 81 MG PO TBEC: 81.0000 mg | DELAYED_RELEASE_TABLET | Freq: Every day | ORAL | 3 refills | Status: AC

## 2021-06-28 NOTE — Patient Instructions (Addendum)
Medication Instructions:  -- Ok to not restart Eliquis -- consider going back to taking Aspirin 81 mg -- OK to stop Crestor (rosuvastatin) -> Keep an eye on blood pressure.  If pressures tend to be lower (for instance less than 100), you may want to consider weaning him off of the Toprol.  I would like to try to keep at least 1/2 tablet on board, but if his pressures are running low routinely i.e. 100 to 110 mmHg, he may want to cut the dose in half.  *If you need a refill on your cardiac medications before your next appointment, please call your pharmacy*   Lab Work: Not necessary    Testing/Procedures: Not necessary,   Follow-Up: At [facility], you and your health needs are our priority.  As part of our continuing mission to provide you with exceptional heart care, we have created designated Provider Care Teams.  These Care Teams include your primary Cardiologist (physician) and Advanced Practice Providers (APPs -  Physician Assistants and Nurse Practitioners) who all work together to provide you with the care you need, when you need it.  We recommend signing up for the patient portal called "MyChart".  Sign up information is provided on this After Visit Summary.  MyChart is used to connect with patients for Virtual Visits (Telemedicine).  Patients are able to view lab/test results, encounter notes, upcoming appointments, etc.  Non-urgent messages can be sent to your provider as well.   To learn more about what you can do with MyChart, go to [email or web address].    Your next appointment:   6 month(s)  The format for your next appointment:   Can  be virtual if more convenient  -> if they would like to come in, we can potentially have him seen in the [place] office  Provider:   [name], MD   Other Instructions N/a

## 2021-06-28 NOTE — Assessment & Plan Note (Signed)
At this point, I think he is showing signs of significant cognitive decline and physical decline.  I think we can stop monitoring labs and stop his statin.

## 2021-06-28 NOTE — Assessment & Plan Note (Signed)
No longer really issue.  He is not having hypotension intermittently with hypertension.  Would do no more than his current dose of beta-blocker.  Low threshold to reduce or wean off beta-blocker.  f pressures tend to be lower (for instance less than 100), you may want to consider weaning him off of the Toprol.  I would like to try to keep at least 1/2 tablet on board, but if his pressures are running low routinely i.e. 100 to 110 mmHg, he may want to cut the dose in half.

## 2021-06-28 NOTE — Assessment & Plan Note (Signed)
Regular, no active anginal symptoms.  At this point I think minimizing medications is reasonable.  Not on aspirin plus or Plavix, and we are now stopping Eliquis.  He could go back to 81 mg aspirin, will defer to other members of the team.  We are stopping his statin, and closely monitoring his beta-blocker dosing.  Low threshold to wean off.

## 2021-06-28 NOTE — Assessment & Plan Note (Signed)
Seems to be stabilizing his weights now.  Eating better on Remeron.  Patient is showing signs of failure to thrive prior to this.  Goals of care talk by PCP and palliative care medicine.

## 2021-06-28 NOTE — Telephone Encounter (Signed)
RN spoke to patient's son [name]. Instruction were given  from today's virtual visit 06/28/21 .  AVS SUMMARY has been sent by mychart .   Son verbalized understanding
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
## 2021-06-28 NOTE — Assessment & Plan Note (Signed)
No complaints of claudication.  Simply not walking enough to note claudication.  With extensive disease, would love to continue with statin etc., but with his worsening physical deterioration, and progressive memory loss, would stop statin just to supply issues.  No longer on Zetia.

## 2021-06-28 NOTE — Telephone Encounter (Signed)
  Patient Consent for Virtual Visit        [name] has provided verbal consent on 06/28/2021 for a virtual visit (video or telephone).   CONSENT FOR VIRTUAL VISIT FOR:  [name]  By participating in this virtual visit I agree to the following:  I hereby voluntarily request, consent and authorize [facility] and its employed or contracted physicians, physician assistants, nurse practitioners or other licensed health care professionals (the Practitioner), to provide me with telemedicine health care services (the "Services") as deemed necessary by the treating Practitioner. I acknowledge and consent to receive the Services by the Practitioner via telemedicine. I understand that the telemedicine visit will involve communicating with the Practitioner through live audiovisual communication technology and the disclosure of certain medical information by electronic transmission. I acknowledge that I have been given the opportunity to request an in-person assessment or other available alternative prior to the telemedicine visit and am voluntarily participating in the telemedicine visit.  I understand that I have the right to withhold or withdraw my consent to the use of telemedicine in the course of my care at any time, without affecting my right to future care or treatment, and that the Practitioner or I may terminate the telemedicine visit at any time. I understand that I have the right to inspect all information obtained and/or recorded in the course of the telemedicine visit and may receive copies of available information for a reasonable fee.  I understand that some of the potential risks of receiving the Services via telemedicine include:  Delay or interruption in medical evaluation due to technological equipment failure or disruption; Information transmitted may not be sufficient (e.g. poor resolution of images) to allow for appropriate medical decision making by the Practitioner;  and/or  In rare instances, security protocols could fail, causing a breach of personal health information.  Furthermore, I acknowledge that it is my responsibility to provide information about my medical history, conditions and care that is complete and accurate to the best of my ability. I acknowledge that Practitioner's advice, recommendations, and/or decision may be based on factors not within their control, such as incomplete or inaccurate data provided by me or distortions of diagnostic images or specimens that may result from electronic transmissions. I understand that the practice of medicine is not an exact science and that Practitioner makes no warranties or guarantees regarding treatment outcomes. I acknowledge that a copy of this consent can be made available to me via my patient portal ([facility]), or I can request a printed copy by calling the office of [facility].    I understand that my insurance will be billed for this visit.   I have read or had this consent read to me. I understand the contents of this consent, which adequately explains the benefits and risks of the Services being provided via telemedicine.  I have been provided ample opportunity to ask questions regarding this consent and the Services and have had my questions answered to my satisfaction. I give my informed consent for the services to be provided through the use of telemedicine in my medical care

## 2021-06-28 NOTE — Assessment & Plan Note (Addendum)
Pretty much asymptomatic PAF as far as I can tell.  He seemed to do well during his recent hospitalizations with no comment on plus or minus A. Fib.  He has pretty significant CHA2DS2-VASc score, however now he has had multiple falls and has had had multiple surgeries.  I think it is reasonable for him to stay off of Eliquis altogether.  We can discuss between ourselves including his PCP and neurologist etc. about potentially just switching to maintenance dose aspirin   81 mg.  Rates seem to be pretty well controlled.  I think for now we can continue current dose of beta-blocker, but need to watch for signs of hypotension and bradycardia.  Low threshold to wean off.  We discussed how this will be done. -> Keep an eye on blood pressure.  If pressures tend to be lower (for instance less than 100), you may want to consider weaning him off of the [place].  I would like to try to keep at least 1/2 tablet on board, but if his pressures are running low routinely i.e. 100 to 110 mmHg, he may want to cut the dose in half.

## 2021-07-01 ENCOUNTER — Other Ambulatory Visit: Payer: Self-pay

## 2021-07-01 ENCOUNTER — Emergency Department: Payer: Medicare Other

## 2021-07-01 ENCOUNTER — Encounter: Payer: Self-pay | Admitting: Emergency Medicine

## 2021-07-01 DIAGNOSIS — S0081XA Abrasion of other part of head, initial encounter: Secondary | ICD-10-CM | POA: Insufficient documentation

## 2021-07-01 DIAGNOSIS — Z7901 Long term (current) use of anticoagulants: Secondary | ICD-10-CM | POA: Insufficient documentation

## 2021-07-01 DIAGNOSIS — Z8546 Personal history of malignant neoplasm of prostate: Secondary | ICD-10-CM | POA: Insufficient documentation

## 2021-07-01 DIAGNOSIS — F0390 Unspecified dementia without behavioral disturbance: Secondary | ICD-10-CM | POA: Insufficient documentation

## 2021-07-01 DIAGNOSIS — S8012XA Contusion of left lower leg, initial encounter: Secondary | ICD-10-CM | POA: Insufficient documentation

## 2021-07-01 DIAGNOSIS — Z96643 Presence of artificial hip joint, bilateral: Secondary | ICD-10-CM | POA: Insufficient documentation

## 2021-07-01 DIAGNOSIS — N189 Chronic kidney disease, unspecified: Secondary | ICD-10-CM | POA: Diagnosis not present

## 2021-07-01 DIAGNOSIS — S8991XA Unspecified injury of right lower leg, initial encounter: Secondary | ICD-10-CM | POA: Diagnosis present

## 2021-07-01 DIAGNOSIS — Z87891 Personal history of nicotine dependence: Secondary | ICD-10-CM | POA: Diagnosis not present

## 2021-07-01 DIAGNOSIS — I4891 Unspecified atrial fibrillation: Secondary | ICD-10-CM | POA: Diagnosis not present

## 2021-07-01 DIAGNOSIS — I129 Hypertensive chronic kidney disease with stage 1 through stage 4 chronic kidney disease, or unspecified chronic kidney disease: Secondary | ICD-10-CM | POA: Insufficient documentation

## 2021-07-01 DIAGNOSIS — J449 Chronic obstructive pulmonary disease, unspecified: Secondary | ICD-10-CM | POA: Diagnosis not present

## 2021-07-01 DIAGNOSIS — Z85840 Personal history of malignant neoplasm of eye: Secondary | ICD-10-CM | POA: Insufficient documentation

## 2021-07-01 DIAGNOSIS — S8011XA Contusion of right lower leg, initial encounter: Secondary | ICD-10-CM | POA: Insufficient documentation

## 2021-07-01 DIAGNOSIS — Z79899 Other long term (current) drug therapy: Secondary | ICD-10-CM | POA: Insufficient documentation

## 2021-07-01 DIAGNOSIS — Z85828 Personal history of other malignant neoplasm of skin: Secondary | ICD-10-CM | POA: Insufficient documentation

## 2021-07-01 DIAGNOSIS — Z7982 Long term (current) use of aspirin: Secondary | ICD-10-CM | POA: Insufficient documentation

## 2021-07-01 DIAGNOSIS — Z85038 Personal history of other malignant neoplasm of large intestine: Secondary | ICD-10-CM | POA: Diagnosis not present

## 2021-07-01 DIAGNOSIS — I2510 Atherosclerotic heart disease of native coronary artery without angina pectoris: Secondary | ICD-10-CM | POA: Diagnosis not present

## 2021-07-01 DIAGNOSIS — Z955 Presence of coronary angioplasty implant and graft: Secondary | ICD-10-CM | POA: Insufficient documentation

## 2021-07-01 DIAGNOSIS — R451 Restlessness and agitation: Secondary | ICD-10-CM | POA: Insufficient documentation

## 2021-07-01 DIAGNOSIS — W19XXXA Unspecified fall, initial encounter: Secondary | ICD-10-CM | POA: Diagnosis not present

## 2021-07-01 LAB — COMPREHENSIVE METABOLIC PANEL
ALT: 20 U/L (ref 0–44)
AST: 31 U/L (ref 15–41)
Albumin: 3.5 g/dL (ref 3.5–5.0)
Alkaline Phosphatase: 111 U/L (ref 38–126)
Anion gap: 7 (ref 5–15)
BUN: 24 mg/dL — ABNORMAL HIGH (ref 8–23)
CO2: 24 mmol/L (ref 22–32)
Calcium: 9.1 mg/dL (ref 8.9–10.3)
Chloride: 109 mmol/L (ref 98–111)
Creatinine, Ser: 0.87 mg/dL (ref 0.61–1.24)
GFR, Estimated: >60 mL/min (ref >60)
Glucose, Bld: 118 mg/dL — ABNORMAL HIGH (ref 70–99)
Potassium: 4.5 mmol/L (ref 3.5–5.1)
Sodium: 140 mmol/L (ref 135–145)
Total Bilirubin: 1.4 mg/dL — ABNORMAL HIGH (ref 0.3–1.2)
Total Protein: 7 g/dL (ref 6.5–8.1)

## 2021-07-01 LAB — CBC
HCT: 29.3 % — ABNORMAL LOW (ref 39.0–52.0)
Hemoglobin: 9.8 g/dL — ABNORMAL LOW (ref 13.0–17.0)
MCH: 33.8 pg (ref 26.0–34.0)
MCHC: 33.4 g/dL (ref 30.0–36.0)
MCV: 101 fL — ABNORMAL HIGH (ref 80.0–100.0)
Platelets: 121 10*3/uL — ABNORMAL LOW (ref 150–400)
RBC: 2.9 MIL/uL — ABNORMAL LOW (ref 4.22–5.81)
RDW: 15.8 % — ABNORMAL HIGH (ref 11.5–15.5)
WBC: 12.6 10*3/uL — ABNORMAL HIGH (ref 4.0–10.5)
nRBC: 0 % (ref 0.0–0.2)

## 2021-07-01 LAB — CBG MONITORING, ED: Glucose-Capillary: 120 mg/dL — ABNORMAL HIGH (ref 70–99)

## 2021-07-01 MED ORDER — QUETIAPINE FUMARATE 25 MG PO TABS
25.0000 mg | ORAL_TABLET | Freq: Once | ORAL | Status: AC
  Administered 2021-07-01: 25 mg via ORAL
  Filled 2021-07-01: qty 1

## 2021-07-01 MED ORDER — ACETAMINOPHEN 500 MG PO TABS
1000.0000 mg | ORAL_TABLET | Freq: Once | ORAL | Status: AC
  Administered 2021-07-01: 1000 mg via ORAL
  Filled 2021-07-01: qty 2

## 2021-07-01 NOTE — ED Triage Notes (Signed)
Fall this morning, unwitnessed. Comes from [place].  Bandage to head.  Per EMS report, [facility] states LOC is more altered than baseline.  Patient with Hx Alzheimer.  Urine test done at [facility], no results yet.  VS wnl.

## 2021-07-01 NOTE — ED Provider Notes (Signed)
Emergency Medicine Provider Triage Evaluation Note  [name] , a 87 y.o. male  was evaluated in triage.  Pt complains of worsening mental status and confusion over the past couple of weeks, has seemed much weaker than usual per daughter with personality changes. Additionally, had an unwitnessed fall this morning at his nursing facility and has been complaining of pain in his right hip.  Review of Systems  Positive: Confusion, weakness, right hip pain. Negative: Fever, cough, shortness of breath.  Physical Exam  BP 105/73 (BP Location: Left Arm)   Pulse 88   Temp 97.7 F (36.5 C) (Oral)   Resp 16   Ht 6' (1.829 m)   Wt 59 kg   SpO2 94%   BMI 17.64 kg/m  Gen:   Awake, no distress, disoriented. Resp:  Normal effort, CTAB MSK:   Diffuse tenderness to right hip with no obvious deformity, no tenderness to left hip, bilateral knees, or ankles. 2+ DP pulses bilaterally.  Medical Decision Making  Medically screening exam initiated at 6:44 PM.  Appropriate orders placed.  RANDALE CARVALHO was informed that the remainder of the evaluation will be completed by another provider, this initial triage assessment does not replace that evaluation, and the importance of remaining in the ED until their evaluation is complete.    Blake Divine, MD 07/01/21 [phone]

## 2021-07-02 ENCOUNTER — Other Ambulatory Visit: Payer: Self-pay

## 2021-07-02 ENCOUNTER — Emergency Department
Admission: EM | Admit: 2021-07-02 | Discharge: 2021-07-02 | Disposition: A | Discharge status: Home / Self Care | Payer: Medicare Other | Attending: Emergency Medicine | Admitting: Emergency Medicine

## 2021-07-02 DIAGNOSIS — W19XXXA Unspecified fall, initial encounter: Secondary | ICD-10-CM

## 2021-07-02 DIAGNOSIS — S8011XA Contusion of right lower leg, initial encounter: Secondary | ICD-10-CM | POA: Diagnosis not present

## 2021-07-02 LAB — URINALYSIS, COMPLETE (UACMP) WITH MICROSCOPIC
Bacteria, UA: NONE SEEN
Bilirubin Urine: NEGATIVE
Glucose, UA: NEGATIVE mg/dL
Ketones, ur: NEGATIVE mg/dL
Leukocytes,Ua: NEGATIVE
Nitrite: NEGATIVE
Protein, ur: NEGATIVE mg/dL
Specific Gravity, Urine: 1.021 (ref 1.005–1.030)
pH: 5 (ref 5.0–8.0)

## 2021-07-02 LAB — TROPONIN I (HIGH SENSITIVITY): Troponin I (High Sensitivity): 6 ng/L (ref <18)

## 2021-07-02 NOTE — ED Notes (Signed)
This RN contacted [facility] and spoke to Philippi to give report on pt condition and discharge status. Pt daughter to transport pt back to facility

## 2021-07-02 NOTE — ED Notes (Signed)
Pt left and right elbow cleaned and wrapped with non adherent dressing and guaze. Bandage on head removed, area cleaned, and new bandage placed.

## 2021-07-02 NOTE — ED Provider Notes (Signed)
[facility]  ____________________________________________   Event Date/Time   First MD Initiated Contact with Patient 07/02/21 0109     (approximate)  I have reviewed the triage vital signs and the nursing notes.   HISTORY  Chief Complaint Altered Mental Status    HPI [name] is a 87 y.o. male with past medical history of dementia, colon cancer, dismal A. fib on Eliquis, multiple frequent falls, bilateral hip replacements who presents after a fall.  He is in memory care unit for severe dementia.  His daughter is accompanying him says that he had a fall today.  It was unwitnessed.  She does not know anything of the nature of the fall.  Patient has had multiple frequent falls recently.  She says from all of his falls patient's he has declined over the last several weeks.  Thinks his right hip is sore because he seems to be uncomfortable when it is moved.  Unable to obtain any history from the patient given his dementia         Past Medical History:  Diagnosis Date   AAA (abdominal aortic aneurysm) 05/2019   Relativley Rapid progression from June-Aug 2020 (4.6 - 5.4 cm): s/p EVAR (Dr. Lucky Cowboy [facility]): EVAR - 23 mm prox, 12 cm distal & x 18 cm length Gore Excluder Endoprosthesis Main body (via RFA distal to lowest Renal A); 2m x 14 cm L Contralateral Limb for L Iliac - extended with 8 mm x 6cm LifeStar stent (post-dilated with 7 mm DEB).  Additional R Iliac PTA not required.    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 ([place] 09/15/2017   09-15-2017 DIAGNOSIS:  A. COLON POLYP X 2, ASCENDING; COLD SNARE:  - TUBULAR ADENOMA.  - NEGATIVE FOR HIGH-GRADE DYSPLASIA AND MALIGNANCY.  - NO ADDITIONAL TISSUE IDENTIFIED; FECAL MATERIAL PRESENT.   B. COLON POLYP X 2, DESCENDING; COLD SNARE:  - ADENOCARCINOMA ARISING IN A DYSPLASTIC SESSILE SERRATED ADENOMA, WITH  LYMPHOVASCULAR INVASION, SEE COMMENT.  - TUBULAR ADENOMA, 2 FRAGMENTS, NEGATIVE FO   Arthritis    Basal cell carcinoma  of eyelid 01/03/2013   2019 - R side of Nose   Benign neoplasm of ascending colon    Benign neoplasm of descending colon    Bilateral iliac artery stenosis ([place] 2007   Bilateral Iliac A stenting; extension of EVAR limbs into both Iliacs -- additional stent placed in L Iliac.   Cancer ([facility]    Skin cancer   Colon polyps    Complication of anesthesia    severe confusion agitation requiring hospital admission x 4 days   Compression fracture of fourth lumbar vertebra ([place] 04/2019   - s/p Kyphoplasty; T12, L3-5 ([facility]   COPD (chronic obstructive pulmonary disease) (HCC)    Centrilobular Emphysema   Coronary artery disease involving native coronary artery without angina pectoris 1998   - s/p CABG, occluded SVG-D1; patent LIMA-LAD, SVG-OM, SVG- RCA   Dementia ([place]    memory loss - started noting late 2019   Falls frequently    broke foot   Fracture 2021   left foot   GERD (gastroesophageal reflux disease)    Hemorrhoid 02/10/2015   History of hiatal hernia    History of kidney stones    HOH (hard of hearing)    Hypertension    Lewy body dementia ([place] 04/04/2020   Macular degeneration disease    Osteoporosis    PAF (paroxysmal atrial fibrillation) ([place]    on Eliquis for [place]    Prostate cancer [facility])    Prostate disorder    Sciatic pain    Chronic   Sleep apnea    cpap   Temporary cerebral vascular dysfunction 02/10/2015   Had negative work-up.  July, 2012.  No medication changes, had forgot them that day, got dehydrated.    Urinary incontinence     Patient Active Problem List   Diagnosis Date Noted   Femur fracture, right ([place]) 05/14/2021   Acute cystitis with hematuria 03/02/2021   UTI (urinary tract infection) 03/01/2021   Coronary artery disease involving native heart without angina pectoris 03/01/2021   Depression with anxiety 03/01/2021   Protein-calorie malnutrition, severe 01/25/2021   Accidental fall 01/24/2021   Closed left hip fracture,  initial encounter ([place]) 01/24/2021   Closed fracture of left distal radius 01/24/2021   Dementia without behavioral disturbance ([place]) 01/24/2021   Pubic ramus fracture, left, closed, initial encounter ([place]) 01/24/2021   Hip fracture ([place]) 01/24/2021   AAA (abdominal aortic aneurysm) without rupture 06/29/2020   LBD (Lewy body dementia) ([place]) 05/14/2020   Numbness and tingling in left hand 03/02/2020   Hematuria    Acute kidney injury superimposed on CKD ([place])    Sepsis due to gram-negative UTI ([place]) 12/26/2019   Left nephrolithiasis 12/26/2019   Hydronephrosis, left 12/26/2019   Status post abdominal aortic aneurysm (AAA) repair 04/15/2019   Do not intubate, cardiopulmonary resuscitation (CPR)-only code status 10/08/2018   Prostate cancer ([place]) 02/25/2018   Prostatic cyst 01/15/2018   Blood in stool    Abnormal feces    Stricture and stenosis of esophagus    Mild cognitive impairment 08/18/2017   Senile purpura ([place]) 04/29/2017   Normocytic anemia 04/29/2017   Frequent falls 04/29/2017   Simple chronic bronchitis ([place]) 12/17/2016   Benign localized hyperplasia of prostate with urinary obstruction 07/15/2016   Elevated PSA 07/15/2016   Incomplete emptying of bladder 07/15/2016   Urge incontinence 07/15/2016   Hypothyroidism 04/25/2016   Macular degeneration 04/25/2016   Erectile dysfunction due to arterial insufficiency    Ischemic heart disease with chronotropic incompetence    CN (constipation) 02/10/2015   DD (diverticular disease) 02/10/2015   Weakness 24/26/8341   Lichen planus 96/22/2979   Restless leg 02/10/2015   Circadian rhythm disorder 02/10/2015   B12 deficiency 02/10/2015   COPD (chronic obstructive pulmonary disease) ([place]) 11/14/2014   Post Inflammatory Lung Changes 11/14/2014   PAF (paroxysmal atrial fibrillation) (HCC) CHA2DS2-VASc = 4. AC = Eliquis    Insomnia 12/09/2013   OSA on CPAP    Dyspnea on exertion - -essentially resolved with BB dose reduction &  wgt loss 03/29/2013   Overweight (BMI 25.0-29.9) -- Wgt back up 01/17/2013   Hx of CABG    Essential hypertension    Hyperlipidemia with target LDL less than 70    Aorto-iliac disease ([place])    BCC (basal cell carcinoma), eyelid 01/03/2013   Allergic rhinitis 08/17/2009   Adaptation reaction 05/28/2009   Acid reflux 05/28/2009   Arthritis, degenerative 05/28/2009   Abnormality of aortic arch branch 06/01/2006   Carotid artery occlusion without infarction 06/01/2006   PAD (peripheral artery disease) - bilateral common iliac stents 12/15/2005   Atherosclerotic heart disease of artery bypass graft 09/01/1997    Past Surgical History:  Procedure Laterality Date   ABDOMINAL AORTIC ENDOVASCULAR STENT GRAFT Bilateral 05/18/2019   Procedure: ABDOMINAL AORTIC ENDOVASCULAR STENT GRAFT;  Surgeon: Algernon Huxley, MD;  Location: [facility] ORS;  Service: Vascular;  Laterality:  Bilateral;   ANKLE SURGERY Left    ORIF    Arch aortogram and carotid aortogram  06/02/2006   Dr Oneida Alar did surgery   BASAL CELL CARCINOMA EXCISION  04/2016   Dermatology   CARDIAC CATHETERIZATION  01/30/1998    (Dr. Linard Millers): Native LAD & mRCA CTO. 90% D1. Mod Cx. SVG-D1 CTO. SVG-RCA & SVG-OM along with LIMA-LAD patent;  LV NORMAL Fxn   CAROTID ENDARTERECTOMY Left Oct. 29, 2007   Dr. Oneida Alar:   CATARACT EXTRACTION W/PHACO Right 03/05/2017   Procedure: CATARACT EXTRACTION PHACO AND INTRAOCULAR LENS PLACEMENT ([place]);  Surgeon: Leandrew Koyanagi, MD;  Location: [facility] ORS;  Service: Ophthalmology;  Laterality: Right;  [place] 00:58.5 AP% 10.6 CDE 6.20 Fluid Pack lot # [id number]   CATARACT EXTRACTION W/PHACO Left 04/15/2017   Procedure: CATARACT EXTRACTION PHACO AND INTRAOCULAR LENS PLACEMENT ([place])  Left;  Surgeon: Leandrew Koyanagi, MD;  Location: [place];  Service: Ophthalmology;  Laterality: Left;   COLONOSCOPY WITH PROPOFOL N/A 09/15/2017   Procedure: COLONOSCOPY WITH PROPOFOL;  Surgeon: Lucilla Lame, MD;  Location: [facility]  ENDOSCOPY;  Service: Endoscopy;  Laterality: N/A;   COLONOSCOPY WITH PROPOFOL N/A 10/26/2018   Procedure: COLONOSCOPY WITH PROPOFOL;  Surgeon: Lucilla Lame, MD;  Location: [facility] ENDOSCOPY;  Service: Endoscopy;  Laterality: N/A;   CORONARY ARTERY BYPASS GRAFT  1998   LIMA-LAD, SVG-OM, SVG-RPDA, SVG-DIAG   CPET / MET  12/2012   Mild chronotropic incompetence - read 82% of predicted; also reduced effort; peak VO2 15.7 / 75% (did not reach Max effort) -- suggested ischemic response in last 1.5 minutes of exercise. Normal pulmonary function on PFTs but poor response to   CYSTOSCOPY W/ RETROGRADES Left 01/13/2020   Procedure: CYSTOSCOPY WITH RETROGRADE PYELOGRAM;  Surgeon: Billey Co, MD;  Location: [facility] ORS;  Service: Urology;  Laterality: Left;   CYSTOSCOPY WITH STENT PLACEMENT Left 12/26/2019   Procedure: CYSTOSCOPY WITH STENT PLACEMENT;  Surgeon: Billey Co, MD;  Location: [facility] ORS;  Service: Urology;  Laterality: Left;   CYSTOSCOPY/URETEROSCOPY/HOLMIUM LASER/STENT PLACEMENT Left 01/13/2020   Procedure: CYSTOSCOPY/URETEROSCOPY/HOLMIUM LASER/STENT EXCHANGE;  Surgeon: Billey Co, MD;  Location: [facility] ORS;  Service: Urology;  Laterality: Left;   ENDOVASCULAR REPAIR/STENT GRAFT N/A 05/18/2019   Procedure: ENDOVASCULAR REPAIR/STENT GRAFT;  Surgeon: Algernon Huxley, MD;  Location: [facility] INVASIVE CV LAB;; EVAR - 23 mm prox, 12 cm distal & x 18 cm length Gore Excluder Endoprosthesis Main body (via RFA distal to lowest Renal A); 26m x 14 cm L Contralateral Limb for L Iliac - extended with 8 mm x 6cm LifeStar stent; no additional R Iliac PTA needed.    ESOPHAGOGASTRODUODENOSCOPY (EGD) WITH PROPOFOL N/A 09/15/2017   Procedure: ESOPHAGOGASTRODUODENOSCOPY (EGD) WITH PROPOFOL;  Surgeon: WLucilla Lame MD;  [facility];  Service: Endoscopy;  Laterality: N/A;   FRACTURE SURGERY Right 03/19/2015   wrist   FRACTURE SURGERY Left    HIATAL HERNIA REPAIR     ILIAC ARTERY STENT  12/15/2005   PTA and  direct stenting rgt and lft common iliac arteries   INCISION AND DRAINAGE OF WOUND Left 01/29/2021   Procedure: IRRIGATION AND DEBRIDEMENT Left Wrist and Removal of Plate;  Surgeon: KThornton Park MD;  Location: [facility] ORS;  Service: Orthopedics;  Laterality: Left;   INTRAMEDULLARY (IM) NAIL INTERTROCHANTERIC Left 01/29/2021   Procedure: INTRAMEDULLARY (IM) NAIL INTERTROCHANTRIC;  Surgeon: KThornton Park MD;  Location: [facility] ORS;  Service: Orthopedics;  Laterality: Left;   INTRAMEDULLARY (IM) NAIL INTERTROCHANTERIC Right 05/15/2021   Procedure: INTRAMEDULLARY (  IM) NAIL INTERTROCHANTRIC;  Surgeon: Renee Harder, MD;  Location: [facility] ORS;  Service: Orthopedics;  Laterality: Right;   KYPHOPLASTY N/A 04/05/2019   Procedure: KYPHOPLASTY T12, L3, L4 L5;  Surgeon: Hessie Knows, MD;  Location: [facility] ORS;  Service: Orthopedics;  Laterality: N/A;   NM GATED MYOVIEW ([place] HX)  05/'19; 8/'20   a) [facility]: Low Risk - no ischemia or infarct.  EF > 65%. no RWMA;; b) [facility]): Normal wall motion.  No ischemia or infarction.   OPEN REDUCTION INTERNAL FIXATION (ORIF) DISTAL RADIAL FRACTURE Left 01/13/2019   Procedure: OPEN REDUCTION INTERNAL FIXATION (ORIF) DISTAL  RADIAL FRACTURE - LEFT - SLEEP APNEA;  Surgeon: Hessie Knows, MD;  Location: [facility] ORS;  Service: Orthopedics;  Laterality: Left;   OPEN REDUCTION INTERNAL FIXATION (ORIF) DISTAL RADIAL FRACTURE Left 01/29/2021   Procedure: OPEN REDUCTION INTERNAL FIXATION (ORIF) DISTAL RADIAL FRACTURE;  Surgeon: Thornton Park, MD;  Location: [facility] ORS;  Service: Orthopedics;  Laterality: Left;   ORIF ELBOW FRACTURE Right 01/01/2017   Procedure: OPEN REDUCTION INTERNAL FIXATION (ORIF) ELBOW/OLECRANON FRACTURE;  Surgeon: Hessie Knows, MD;  Location: [facility] ORS;  Service: Orthopedics;  Laterality: Right;   ORIF WRIST FRACTURE Right 03/19/2015   Procedure: OPEN REDUCTION INTERNAL FIXATION (ORIF) WRIST FRACTURE;  Surgeon: Hessie Knows, MD;  Location: [facility] ORS;  Service:  Orthopedics;  Laterality: Right;   SHOULDER ARTHROSCOPY Right    THORACIC AORTA - CAROTID ANGIOGRAM  October 2007   Dr. 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 Dilated left atrium.   TRANSTHORACIC ECHOCARDIOGRAM  04/2019   [facility] April 20, 2019): EF 55-60 %.  Mild LVH.  Relatively normal valves.    Prior to Admission medications   Medication Sig Start Date End Date Taking? Authorizing Provider  acetaminophen (TYLENOL) 500 MG tablet Take 1,000 mg by mouth in the morning and at bedtime.    [provider]  aspirin EC 81 MG tablet Take 1 tablet (81 mg total) by mouth daily. Swallow whole. 06/28/21   Leonie Man, MD  benzocaine (HURRICAINE) 20 % GEL Use as directed 1 application in the mouth or throat 4 (four) times daily.    [provider]  Cholecalciferol 50 MCG (2000 UT) TBDP Take 2,000 Units by mouth daily.    [provider]  ezetimibe (ZETIA) 10 MG tablet TAKE 1 TABLET BY MOUTH DAILY Patient not taking: No sig reported 12/21/20   [name], MD  feeding supplement (ENSURE ENLIVE / ENSURE PLUS) LIQD Take 237 mLs by mouth 3 (three) times daily between meals. 03/05/21   Ezekiel Slocumb, DO  ferrous sulfate (FERROUSUL) 325 (65 FE) MG tablet Take 1 tablet (325 mg total) by mouth every other day. 05/17/21   Wouk, Ailene Rud, MD  finasteride (PROSCAR) 5 MG tablet TAKE ONE TABLET BY MOUTH EVERY DAY 12/21/20   Stoioff, Ronda Fairly, MD  fluticasone (FLONASE) 50 MCG/ACT nasal spray PLACE 2 SPRAYS INTO BOTH NOSTRILS EVERY DAY 06/27/21   [name], MD  Magnesium Oxide 500 MG TABS Take 500 mg by mouth every evening.    [provider]  metoprolol succinate (TOPROL XL) 25 MG 24 hr tablet Take 1 tablet (25 mg total) by mouth daily. 05/17/21   Wouk, Ailene Rud, MD  mirtazapine (REMERON)  7.5 MG tablet Take 7.5 mg by mouth at bedtime. 12/26/20   [provider]  MYRBETRIQ 50 MG TB24 tablet TAKE ONE TABLET BY MOUTH EVERY DAY Patient taking differently: Take 50 mg by mouth at bedtime. 12/21/20   Stoioff, Ronda Fairly, MD  Polyethyl Glycol-Propyl Glycol (SYSTANE) 0.4-0.3 % SOLN Place 1 drop into both eyes at bedtime.    [provider]  QUEtiapine (SEROQUEL) 25 MG tablet Take 25 mg by mouth at bedtime.    [provider]  rosuvastatin (CRESTOR) 20 MG tablet TAKE ONE TABLET BY MOUTH EVERY DAY 05/24/21   [name], MD    Allergies Clonazepam, Codeine, Ropinirole, Hydrocodone, Niacin and related, Oxycodone, and Tramadol  Family History  Problem Relation Age of Onset   Ulcers Mother        Peptic   Dementia Mother    Alcohol abuse Father     Social History Social History   Tobacco Use   Smoking status: Former    Types: Cigarettes    Quit date: 08/04/1997    Years since quitting: 23.9   Smokeless tobacco: Never  Vaping Use   Vaping Use: Never used  Substance Use Topics   Alcohol use: No   Drug use: No    Review of Systems   Review of Systems  Unable to perform ROS: Dementia   Physical Exam Updated Vital Signs BP 121/87   Pulse 99   Temp 98.4 F (36.9 C) (Axillary)   Resp 14   Ht 6' (1.829 m)   Wt 59 kg   SpO2 98%   BMI 17.64 kg/m   Physical Exam Vitals and nursing note reviewed.  Constitutional:      Comments: Patient is thin, chronically ill-appearing  HENT:     Head: Normocephalic.     Comments: Abrasion on the right forehead Eyes:     General: No scleral icterus.    Conjunctiva/sclera: Conjunctivae normal.     Pupils: Pupils are equal, round, and reactive to light.  Cardiovascular:     Rate and Rhythm: Normal rate and regular rhythm.  Pulmonary:     Effort: Pulmonary effort is normal. No respiratory distress.  Abdominal:     General: Abdomen is flat. There is no distension.     Palpations: Abdomen is soft.      Tenderness: There is no abdominal tenderness. There is no guarding.  Genitourinary:    Penis: Normal.   Musculoskeletal:        General: No deformity or signs of injury.     Cervical back: Normal range of motion.     Comments: And able to range the bilateral hips, patient does seem to grimace with range of both hips bilaterally pelvis is stable There is ecchymosis over the bilateral lower extremities diffusely, no obvious swelling or deformity   Skin:    Coloration: Skin is not jaundiced or pale.  Neurological:     Comments: Patient is awake with eyes open, does not follow commands, no verbal response  Psychiatric:     Comments: Intermittently somewhat agitated and appears uncomfortable     LABS (all labs ordered are listed, but only abnormal results are displayed)  Labs Reviewed  COMPREHENSIVE METABOLIC PANEL - Abnormal; Notable for the following components:      Result Value   Glucose, Bld 118 (*)    BUN 24 (*)    Total Bilirubin 1.4 (*)    All other components within normal limits  CBC - Abnormal; Notable for the following components:   WBC 12.6 (*)    RBC 2.90 (*)  Hemoglobin 9.8 (*)    HCT 29.3 (*)    MCV 101.0 (*)    RDW 15.8 (*)    Platelets 121 (*)    All other components within normal limits  URINALYSIS, COMPLETE (UACMP) WITH MICROSCOPIC - Abnormal; Notable for the following components:   Color, Urine YELLOW (*)    APPearance HAZY (*)    Hgb urine dipstick MODERATE (*)    All other components within normal limits  CBG MONITORING, ED - Abnormal; Notable for the following components:   Glucose-Capillary 120 (*)    All other components within normal limits  TROPONIN I (HIGH SENSITIVITY)   ____________________________________________  EKG  Very difficult to interpret secondary to baseline abnormality, attempted multiple times and was unable to achieve better quality, right bundle branch block similar to morphology in the precordial leads to  baseline ____________________________________________  RADIOLOGY I, Madelin Headings, personally viewed and evaluated these images (plain radiographs) as part of my medical decision making, as well as reviewing the written report by the radiologist.  ED MD interpretation:   X-ray of the pelvis does not show any acute fracture Viewed the CT of the C-spine and head which do not show any acute injuries    ____________________________________________   PROCEDURES  Procedure(s) performed (including Critical Care):  Procedures   ____________________________________________   INITIAL IMPRESSION / ASSESSMENT AND PLAN / ED COURSE     Patient is an 87 year old male with chronic dementia frequent falls presents after an unwitnessed fall at the facility today.  His daughter says he has had a decline over the last several weeks.  She notes that he is agitated from being in the waiting room for so long and that she thinks it is best for them to go home.  On exam he is awake but without verbal response and does not respond to commands.  He has multiple areas of ecchymosis on the bilateral upper and lower extremities no ecchymosis or tenderness of the chest wall or abdomen or pelvis.  His daughter is concerned about right hip pain.  On my exam I am able to range both hips with some grimace from the patient with both.  X-ray of the right hip does not show any acute fracture.  His CT head and C-spine are both negative for acute injury.  Basic labs were obtained which are all at his baseline.  Will obtain EKG and a urine.  I think it is best for the patient to go back to his facility where he can be in a constant environment and to be less agitated.  Of note patient still is on Eliquis for stroke prevention.  His daughter and son are discussing with the cardiologist risks and benefits of this currently.      ____________________________________________   FINAL CLINICAL IMPRESSION(S) / ED  DIAGNOSES  Final diagnoses:  Fall, initial encounter     ED Discharge Orders     None        Note:  This document was prepared using Dragon voice recognition software and may include unintentional dictation errors.    Rada Hay, MD 07/02/21 [phone]

## 2021-07-02 NOTE — Discharge Instructions (Addendum)
The CAT scan of your head and neck did not show any injury to your brain or broken bones in your neck.  The x-ray of your hip also did not show any broken bones.  Your urine sample did not show any evidence of infection and the rest of your blood work was normal.  Please discuss with your primary care provider and your cardiologist about potentially coming off of the Eliquis due to your risk of bleeding.

## 2021-07-03 ENCOUNTER — Telehealth: Payer: Self-pay | Admitting: Emergency Medicine

## 2021-08-01 DEATH — deceased

## 2021-12-17 ENCOUNTER — Ambulatory Visit (INDEPENDENT_AMBULATORY_CARE_PROVIDER_SITE_OTHER): Payer: Medicare Other | Admitting: Nurse Practitioner

## 2021-12-17 ENCOUNTER — Encounter (INDEPENDENT_AMBULATORY_CARE_PROVIDER_SITE_OTHER): Payer: Medicare Other

## 2021-12-17 ENCOUNTER — Encounter (INDEPENDENT_AMBULATORY_CARE_PROVIDER_SITE_OTHER): Payer: Self-pay

## 2021-12-17 ENCOUNTER — Other Ambulatory Visit (INDEPENDENT_AMBULATORY_CARE_PROVIDER_SITE_OTHER): Payer: Medicare Other
